# Patient Record
Sex: Female | Born: 1960
Health system: Southern US, Community
[De-identification: ages and names within clinical notes are randomized; demographics above are authoritative.]

## PROBLEM LIST (undated history)

## (undated) DIAGNOSIS — J40 Bronchitis, not specified as acute or chronic: Secondary | ICD-10-CM

## (undated) DIAGNOSIS — C801 Malignant (primary) neoplasm, unspecified: Secondary | ICD-10-CM

## (undated) DIAGNOSIS — D259 Leiomyoma of uterus, unspecified: Secondary | ICD-10-CM

## (undated) DIAGNOSIS — R0600 Dyspnea, unspecified: Secondary | ICD-10-CM

## (undated) DIAGNOSIS — Z853 Personal history of malignant neoplasm of breast: Secondary | ICD-10-CM

## (undated) DIAGNOSIS — J302 Other seasonal allergic rhinitis: Secondary | ICD-10-CM

## (undated) DIAGNOSIS — Z531 Procedure and treatment not carried out because of patient's decision for reasons of belief and group pressure: Secondary | ICD-10-CM

## (undated) DIAGNOSIS — A048 Other specified bacterial intestinal infections: Secondary | ICD-10-CM

## (undated) DIAGNOSIS — F419 Anxiety disorder, unspecified: Secondary | ICD-10-CM

## (undated) DIAGNOSIS — Z170 Estrogen receptor positive status [ER+]: Secondary | ICD-10-CM

## (undated) DIAGNOSIS — M1990 Unspecified osteoarthritis, unspecified site: Secondary | ICD-10-CM

## (undated) DIAGNOSIS — E785 Hyperlipidemia, unspecified: Secondary | ICD-10-CM

## (undated) DIAGNOSIS — R102 Pelvic and perineal pain: Secondary | ICD-10-CM

## (undated) DIAGNOSIS — IMO0001 Reserved for inherently not codable concepts without codable children: Secondary | ICD-10-CM

## (undated) DIAGNOSIS — N882 Stricture and stenosis of cervix uteri: Secondary | ICD-10-CM

## (undated) DIAGNOSIS — K389 Disease of appendix, unspecified: Secondary | ICD-10-CM

## (undated) DIAGNOSIS — Z973 Presence of spectacles and contact lenses: Secondary | ICD-10-CM

## (undated) DIAGNOSIS — I10 Essential (primary) hypertension: Secondary | ICD-10-CM

## (undated) DIAGNOSIS — K219 Gastro-esophageal reflux disease without esophagitis: Secondary | ICD-10-CM

## (undated) DIAGNOSIS — C50812 Malignant neoplasm of overlapping sites of left female breast: Secondary | ICD-10-CM

## (undated) DIAGNOSIS — R0609 Other forms of dyspnea: Secondary | ICD-10-CM

## (undated) DIAGNOSIS — G4733 Obstructive sleep apnea (adult) (pediatric): Secondary | ICD-10-CM

## (undated) DIAGNOSIS — R7611 Nonspecific reaction to tuberculin skin test without active tuberculosis: Secondary | ICD-10-CM

## (undated) DIAGNOSIS — Z87410 Personal history of cervical dysplasia: Secondary | ICD-10-CM

## (undated) DIAGNOSIS — R51 Headache: Secondary | ICD-10-CM

## (undated) HISTORY — PX: MASTECTOMY: SHX3

## (undated) HISTORY — DX: Leiomyoma of uterus, unspecified: D25.9

## (undated) HISTORY — DX: Hyperlipidemia, unspecified: E78.5

## (undated) HISTORY — DX: Malignant neoplasm of overlapping sites of left female breast: C50.812

## (undated) HISTORY — PX: CERVICAL CONIZATION W/BX: SHX1330

## (undated) HISTORY — DX: Obstructive sleep apnea (adult) (pediatric): G47.33

## (undated) HISTORY — DX: Essential (primary) hypertension: I10

## (undated) HISTORY — DX: Estrogen receptor positive status (ER+): Z17.0

## (undated) HISTORY — PX: APPENDECTOMY: SHX54

---
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
## 2009-03-26 HISTORY — PX: COLONOSCOPY: SHX174

## 2009-09-13 ENCOUNTER — Encounter: Payer: Self-pay | Admitting: Cardiology

## 2010-10-07 ENCOUNTER — Emergency Department (HOSPITAL_COMMUNITY)
Admission: EM | Admit: 2010-10-07 | Discharge: 2010-10-07 | Disposition: A | Discharge status: Home / Self Care | Payer: Medicaid - Out of State | Attending: Emergency Medicine | Admitting: Emergency Medicine

## 2010-10-07 DIAGNOSIS — M79609 Pain in unspecified limb: Secondary | ICD-10-CM | POA: Insufficient documentation

## 2010-10-07 DIAGNOSIS — T148 Other injury of unspecified body region: Secondary | ICD-10-CM | POA: Insufficient documentation

## 2010-10-07 DIAGNOSIS — M549 Dorsalgia, unspecified: Secondary | ICD-10-CM | POA: Insufficient documentation

## 2010-10-07 DIAGNOSIS — R609 Edema, unspecified: Secondary | ICD-10-CM | POA: Insufficient documentation

## 2010-10-07 DIAGNOSIS — L298 Other pruritus: Secondary | ICD-10-CM | POA: Insufficient documentation

## 2010-10-07 DIAGNOSIS — W57XXXA Bitten or stung by nonvenomous insect and other nonvenomous arthropods, initial encounter: Secondary | ICD-10-CM | POA: Insufficient documentation

## 2010-10-07 DIAGNOSIS — L2989 Other pruritus: Secondary | ICD-10-CM | POA: Insufficient documentation

## 2011-01-04 ENCOUNTER — Emergency Department (HOSPITAL_COMMUNITY): Payer: Medicare Other

## 2011-01-04 ENCOUNTER — Emergency Department (HOSPITAL_COMMUNITY)
Admission: EM | Admit: 2011-01-04 | Discharge: 2011-01-04 | Disposition: A | Discharge status: Home / Self Care | Payer: Medicare Other | Attending: Emergency Medicine | Admitting: Emergency Medicine

## 2011-01-04 DIAGNOSIS — M545 Low back pain, unspecified: Secondary | ICD-10-CM | POA: Insufficient documentation

## 2011-01-04 DIAGNOSIS — Z9010 Acquired absence of unspecified breast and nipple: Secondary | ICD-10-CM | POA: Insufficient documentation

## 2011-01-04 DIAGNOSIS — M546 Pain in thoracic spine: Secondary | ICD-10-CM | POA: Insufficient documentation

## 2011-01-04 DIAGNOSIS — Z79899 Other long term (current) drug therapy: Secondary | ICD-10-CM | POA: Insufficient documentation

## 2011-01-04 DIAGNOSIS — Z853 Personal history of malignant neoplasm of breast: Secondary | ICD-10-CM | POA: Insufficient documentation

## 2011-01-04 DIAGNOSIS — R079 Chest pain, unspecified: Secondary | ICD-10-CM | POA: Insufficient documentation

## 2011-01-04 DIAGNOSIS — M79609 Pain in unspecified limb: Secondary | ICD-10-CM | POA: Insufficient documentation

## 2011-01-04 LAB — URINALYSIS, ROUTINE W REFLEX MICROSCOPIC
Bilirubin Urine: NEGATIVE
Glucose, UA: NEGATIVE mg/dL
Ketones, ur: NEGATIVE mg/dL
Nitrite: NEGATIVE
Protein, ur: NEGATIVE mg/dL
pH: 6.5 (ref 5.0–8.0)

## 2011-01-04 LAB — POCT PREGNANCY, URINE: Preg Test, Ur: NEGATIVE

## 2011-01-11 ENCOUNTER — Emergency Department (HOSPITAL_COMMUNITY)
Admission: EM | Admit: 2011-01-11 | Discharge: 2011-01-12 | Disposition: A | Discharge status: Home / Self Care | Payer: Medicare Other | Attending: Emergency Medicine | Admitting: Emergency Medicine

## 2011-01-11 DIAGNOSIS — G4730 Sleep apnea, unspecified: Secondary | ICD-10-CM | POA: Insufficient documentation

## 2011-01-11 DIAGNOSIS — R0602 Shortness of breath: Secondary | ICD-10-CM | POA: Insufficient documentation

## 2011-01-11 DIAGNOSIS — J3489 Other specified disorders of nose and nasal sinuses: Secondary | ICD-10-CM | POA: Insufficient documentation

## 2011-01-11 DIAGNOSIS — M549 Dorsalgia, unspecified: Secondary | ICD-10-CM | POA: Insufficient documentation

## 2011-01-11 DIAGNOSIS — J45909 Unspecified asthma, uncomplicated: Secondary | ICD-10-CM | POA: Insufficient documentation

## 2011-01-11 DIAGNOSIS — R059 Cough, unspecified: Secondary | ICD-10-CM | POA: Insufficient documentation

## 2011-01-11 DIAGNOSIS — R05 Cough: Secondary | ICD-10-CM | POA: Insufficient documentation

## 2011-01-11 DIAGNOSIS — I10 Essential (primary) hypertension: Secondary | ICD-10-CM | POA: Insufficient documentation

## 2011-01-12 LAB — POCT I-STAT, CHEM 8
BUN: 16 mg/dL (ref 6–23)
Calcium, Ion: 1.15 mmol/L (ref 1.12–1.32)
Chloride: 106 mEq/L (ref 96–112)
Creatinine, Ser: 0.9 mg/dL (ref 0.50–1.10)
Glucose, Bld: 117 mg/dL — ABNORMAL HIGH (ref 70–99)
HCT: 45 % (ref 36.0–46.0)
Hemoglobin: 15.3 g/dL — ABNORMAL HIGH (ref 12.0–15.0)
Potassium: 4 meq/L (ref 3.5–5.1)
Sodium: 142 meq/L (ref 135–145)
TCO2: 21 mmol/L (ref 0–100)

## 2011-01-12 LAB — CBC
MCH: 26 pg (ref 26.0–34.0)
MCHC: 32.9 g/dL (ref 30.0–36.0)
MCV: 79 fL (ref 78.0–100.0)
Platelets: 286 10*3/uL (ref 150–400)
RDW: 15 % (ref 11.5–15.5)

## 2011-01-12 LAB — DIFFERENTIAL
Eosinophils Absolute: 0.3 10*3/uL (ref 0.0–0.7)
Eosinophils Relative: 4 % (ref 0–5)
Lymphs Abs: 2.1 10*3/uL (ref 0.7–4.0)
Monocytes Relative: 7 % (ref 3–12)

## 2011-01-31 ENCOUNTER — Ambulatory Visit
Admission: RE | Admit: 2011-01-31 | Discharge: 2011-01-31 | Disposition: A | Discharge status: Home / Self Care | Payer: Medicare Other | Source: Ambulatory Visit | Attending: Internal Medicine | Admitting: Internal Medicine

## 2011-01-31 ENCOUNTER — Other Ambulatory Visit: Payer: Self-pay | Admitting: Internal Medicine

## 2011-01-31 DIAGNOSIS — R0789 Other chest pain: Secondary | ICD-10-CM

## 2011-01-31 DIAGNOSIS — R52 Pain, unspecified: Secondary | ICD-10-CM

## 2011-02-07 ENCOUNTER — Encounter: Payer: Self-pay | Admitting: Internal Medicine

## 2011-02-08 ENCOUNTER — Ambulatory Visit (INDEPENDENT_AMBULATORY_CARE_PROVIDER_SITE_OTHER): Payer: Medicare Other | Admitting: Internal Medicine

## 2011-02-08 ENCOUNTER — Encounter: Payer: Self-pay | Admitting: Internal Medicine

## 2011-02-08 DIAGNOSIS — J45909 Unspecified asthma, uncomplicated: Secondary | ICD-10-CM

## 2011-02-08 MED ORDER — ALBUTEROL SULFATE HFA 108 (90 BASE) MCG/ACT IN AERS: 2.0000 | INHALATION_SPRAY | Freq: Four times a day (QID) | RESPIRATORY_TRACT | Status: DC | PRN

## 2011-02-08 MED ORDER — FLUTICASONE PROPIONATE HFA 44 MCG/ACT IN AERO: 2.0000 | INHALATION_SPRAY | Freq: Two times a day (BID) | RESPIRATORY_TRACT | Status: DC

## 2011-02-08 NOTE — Progress Notes (Signed)
  Subjective:    Patient ID: Michaela Little, female    DOB: January 13, 1961, 50 y.o.   MRN: [id number]  HPI  55 yobf quit smoking 1990 with asthma dx  around age 5 worse symptoms since around 2004 when eval in  [facility] moved to [place]  10/2010 and referred to pulmonary clinic by Dr Glade Lloyd 02/08/2011   02/08/2011 1st pulmonary eval cc doe x 50 ft plus episodes of breathing attacks(Triggers are emotional, smells, uri's )  where can't breath at rest baseline qod albuterol much better when she takes her flovent, assoc with sensation on pnds/ no purulent sputum.  In general Sleeping ok without nocturnal  or early am exacerbation  of respiratory  c/o's or need for noct saba. Also denies any obvious fluctuation of symptoms with weather or environmental changes or other aggravating or alleviating factors except as outlined above     Review of Systems  Constitutional: Negative for fever, chills and unexpected weight change.  HENT: Positive for congestion, sneezing and postnasal drip. Negative for ear pain, nosebleeds, sore throat, rhinorrhea, trouble swallowing, dental problem, voice change and sinus pressure.   Eyes: Positive for visual disturbance.  Respiratory: Positive for cough and shortness of breath. Negative for choking.   Cardiovascular: Positive for chest pain and leg swelling.  Gastrointestinal: Positive for vomiting and abdominal pain. Negative for diarrhea.  Genitourinary: Negative for difficulty urinating.  Musculoskeletal: Positive for arthralgias.  Skin: Positive for rash.  Neurological: Negative for tremors, syncope and headaches.  Hematological: Does not bruise/bleed easily.       Objective:   Physical Exam Obese bf nad Wt 285 02/08/2011  HEENT mild turbinate edema.  Oropharynx no thrush or excess pnd or cobblestoning.  No JVD or cervical adenopathy. Mild accessory muscle hypertrophy. Trachea midline, nl thryroid. Chest was hyperinflated by percussion with diminished breath sounds  and moderate increased exp time without wheeze. Hoover sign positive at mid inspiration. Regular rate and rhythm without murmur gallop or rub or increase P2 or edema.  Abd: no hsm, nl excursion. Ext warm without cyanosis or clubbing.      cxr 01/31/11 No acute abnormality.  Assessment & Plan:

## 2011-02-08 NOTE — Patient Instructions (Signed)
Work on inhaler technique:  relax and gently blow all the way out then take a nice smooth deep breath back in, triggering the inhaler at same time you start breathing in.  Hold for up to 5 seconds if you can.  Rinse and gargle with water when done   If your mouth or throat starts to bother you,   I suggest you time the inhaler to your dental care and after using the inhaler(s) brush teeth and tongue with a baking soda containing toothpaste and when you rinse this out, gargle with it first to see if this helps your mouth and throat.     If your breathing worsens or you need to use your rescue inhaler (ventolin) more than twice weekly or wake up more than twice a month with any respiratory symptoms or require more than two rescue inhalers per year, we need to see you right away because this means we're not controlling the underlying problem (inflammation) adequately.  Rescue inhalers do not control inflammation and overuse can lead to unnecessary and costly consequences.  They can make you feel better temporarily but eventually they will quit working effectively much as sleep aids lead to more insomnia if used regularly.   Please schedule a follow up visit in 3 months but call sooner if needed (Tammy NP also available for any flare)

## 2011-02-08 NOTE — Assessment & Plan Note (Addendum)
DDX of  difficult airways managment all start with A and  include Adherence, Ace Inhibitors, Acid Reflux, Active Sinus Disease, Alpha 1 Antitripsin deficiency, Anxiety masquerading as Airways dz,  ABPA,  allergy(esp in young), Aspiration (esp in elderly), Adverse effects of DPI,  Active smokers, plus two Bs  = Bronchiectasis and Beta blocker use..and one C= CHF  In this case Adherence is the biggest issue and starts with  inability to use HFA effectively and also  understand that SABA treats the symptoms but doesn't get to the underlying problem (inflammation).  I used  the analogy of putting steroid cream on a rash to help explain the meaning of topical therapy and the need to get the drug to the target tissue.  The proper method of use, as well as anticipated side effects, of this metered-dose inhaler are discussed and demonstrated to the patient. Improved to 75% p repeated coaching  ? Allergies > continue singulair    Each maintenance medication was reviewed in detail including most importantly the difference between maintenance and as needed and under what circumstances the prns are to be used.  Please see instructions for details which were reviewed in writing and the patient given a copy.

## 2011-02-19 ENCOUNTER — Ambulatory Visit (HOSPITAL_BASED_OUTPATIENT_CLINIC_OR_DEPARTMENT_OTHER): Payer: Medicare Other | Admitting: Oncology

## 2011-02-19 ENCOUNTER — Emergency Department (HOSPITAL_COMMUNITY)
Admission: EM | Admit: 2011-02-19 | Discharge: 2011-02-19 | Disposition: A | Discharge status: Home / Self Care | Payer: Medicare Other

## 2011-02-19 ENCOUNTER — Ambulatory Visit: Payer: Medicare Other | Admitting: Genetic Counselor

## 2011-02-19 ENCOUNTER — Encounter: Payer: Self-pay | Admitting: *Deleted

## 2011-02-19 ENCOUNTER — Encounter (HOSPITAL_BASED_OUTPATIENT_CLINIC_OR_DEPARTMENT_OTHER): Payer: Medicare Other | Admitting: Genetic Counselor

## 2011-02-19 VITALS — BP 155/92 | HR 78 | Temp 98.1°F | Ht 68.0 in | Wt 281.7 lb

## 2011-02-19 DIAGNOSIS — IMO0002 Reserved for concepts with insufficient information to code with codable children: Secondary | ICD-10-CM

## 2011-02-19 DIAGNOSIS — I890 Lymphedema, not elsewhere classified: Secondary | ICD-10-CM

## 2011-02-19 DIAGNOSIS — Z87410 Personal history of cervical dysplasia: Secondary | ICD-10-CM

## 2011-02-19 DIAGNOSIS — C50919 Malignant neoplasm of unspecified site of unspecified female breast: Secondary | ICD-10-CM

## 2011-02-19 LAB — CBC WITH DIFFERENTIAL/PLATELET
Basophils Absolute: 0 10*3/uL (ref 0.0–0.1)
EOS%: 3.5 % (ref 0.0–7.0)
MCH: 26.6 pg (ref 25.1–34.0)
MCV: 81.3 fL (ref 79.5–101.0)
MONO%: 5.6 % (ref 0.0–14.0)
RBC: 5.27 10*6/uL (ref 3.70–5.45)
RDW: 15.7 % — ABNORMAL HIGH (ref 11.2–14.5)

## 2011-02-19 LAB — COMPREHENSIVE METABOLIC PANEL
AST: 17 U/L (ref 0–37)
Albumin: 3.7 g/dL (ref 3.5–5.2)
Alkaline Phosphatase: 122 U/L — ABNORMAL HIGH (ref 39–117)
BUN: 9 mg/dL (ref 6–23)
Potassium: 3.9 mEq/L (ref 3.5–5.3)
Sodium: 137 mEq/L (ref 135–145)

## 2011-02-19 NOTE — Progress Notes (Signed)
Michaela Little    HPI:  Michaela Little is a 50 year old [place] a woman recently moved to [place] and establishing herself today in my practice, with a history of breast cancer.  The patient had left breast and left axillary lymph node biopsy performed March of 2010, both positive (I do not have the pathology report at this point). She was treated neo-adjuvantly at [facility] with (a) paclitaxel weekly x12 and (b) doxorubicin/cyclophosphamide in dose dense fashion x4, both given with tifiparnib. She then proceeded to left modified radical mastectomy 12/23/2008, the final pathology showing a 4 cm residual invasive lobular carcinoma, involving 5/22 lymph nodes sampled. The tumor was estrogen and progesterone receptor positive, HER-2 negative. Postoperatively she received 50 gray of radiation completed January of 2011, including the left supraclavicular lymph node basin. She was then started on tamoxifen, but developed some uterine lining thickening and was switched to anastrozole she thinks about 9 months ago. She has not taken this medication for the last 2 months due to her move here. Otherwise she tells me she has been tolerating it well.  Past Medical History  Diagnosis Date  . Breast cancer   . Uterine fibroid   . Hyperlipidemia   . Hypertension   . Asthma   . 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  Past Surgical History  Procedure Date  . Eye surgery 1961    right  . Mastectomy 2010    left    Family History  Problem Relation Age of Onset  . Breast cancer Mother   . Mental illness Brother   . Heart disease Brother   . Cerebral palsy Daughter   . Colon cancer Mother   . Emphysema Brother     never smoker   the patient knows little about her father. Her mother died  at the age of 88. She was diagnosed with colon cancer in her mid 64s. She had some breast surgery but the patient is not sure of what the cause was. The patient had no sisters she has 3 brothers no history of cancer there.  Gynecologic history: She is GX P5. At first pregnancy to term age 43. Menarche age 31. Last menstrual period about 2 years ago, when she had her chemotherapy  Social History: She is disabled secondary to arthritis, she tells me. She moved from [place] to [place] for "peace of mind". Of her 5 children only one daughter a listing Michaela Little, [place]. The patient lives alone. She mostly reads and does computer games and her phone during the day. She tells me she is starting at a stretching program and would eventually like to start an exercise program. She has 20 grandchildren. She does not have a healthcare power of attorney.    Health maintenance:   reports that she quit smoking about 22 years ago. Her smoking use included Cigarettes. She has a 3 pack-year smoking history. She has never used smokeless tobacco. She reports that she does not drink alcohol or use illicit drugs.   Cholesterol   Bone density   Colonoscopy 2010  (PAP)   Allergies:  Allergies  Allergen Reactions  . Tums Hives        ROS she complains of pain in her knees and hands:she says her knees and hands cramp in her she tries to  do anything with them.  She has some pain in her right chest area when she takes a deep breath. There has been no cough phlegm production or hemoptysis. She doesn't report shortness of breath. There have been no unusual headaches, no new visual changes, and no change in bowel or bladder habits. The detailed review of systems was otherwise significant only for moderate fatigue.  Physical Exam:  Blood pressure 155/92, pulse 78, temperature 98.1 F (36.7 C), temperature source Oral, height 5\' 8"  (1.727 m), weight 281 lb 11.2 oz (127.778 kg).  Sclerae unicteric; pupils  equal and reactive, with full extraocular movements Oropharynx clear No peripheral adenopathy Lungs no rales or rhonchi Heart regular rate and rhythm Abd benign MSK no focal spinal tenderness, there is moderate left upper extremity edema which appears chronic Neuro: nonfocal Breasts: Status post left modified radical mastectomy, with no evidence of local recurrence. She has an implant in place on the left. Right breast was unremarkable.  LABS   No results found for this or any previous visit (from the past 48 hour(s)).     FILMS: Chest X-ray Jan 31, 2011 was unremarkable. Rib films same day also negative. LSSpine Jan 04, 2011 showed mild DDD.     Assessment:  50 year old [place] woman recently moved here from [place], with left-sided breast cancer diagnosed March of 2010 and treated neo-adjuvantly as detailed above, followed by definitive left modified radical mastectomy September of 2010 for a T2 N2 or stage III invasive lobular breast cancer, estrogen and progesterone receptor positive, HER-2 negative, with post mastectomy radiation completed January of 2011, then briefly on tamoxifen, discontinued it because of the uterine lining concerns, on anastrozole since May of this year with good tolerance.  Other problems include left upper extremity lymphedema, borderline concern regarding genetic family history, history of cervical dysplasia, and history of dense breasts.    Plan: I am referring her to our lymphedema clinic and I also wrote her a prescription for a left upper extremity compression sleeve and glove today. I rewrote her a prescription for anastrozole and she understands she will continue on this medication an additional 4-1/2 years. We will do a bone density sometime within the next year or so.  I am referring her to gynecology for followup of her cervical dysplasia. I am also referring her to our genetics counselor for further evaluation. She will return to see [place] in March  of 2013 and at that point we will set her up for repeat mammography on the right at [facility] August of 2013, and we will also obtain a BSGI at the same time.  The patient knows to call us for any other issues related to her breast cancer that we may be of help with. Lab work from today is pending.  MAGRINAT,GUSTAV C 02/19/2011, 11:43 AM

## 2011-02-19 NOTE — Progress Notes (Signed)
CSW referred for transportation, financial needs, and other psychosocial needs.  CSW met with pt after her appointment to assess for needs and concerns.  Pt recently moved to [place] from [place] and had questions regarding what resources were available.  Pt stated she had Medicaid in [place], and has already started the process to tranfers/apply in [place].  CSW discussed ACS and road to recovery for transportation to appointments, and pt gave CSW permission to make a referral to ACS.  Pt stated she was planning to meet with the Financial counselor to discuss financial assistance.  CSW encouraged pt to complete all applications.  Pt also expressed interest in emotional support resources at [facility].  CSW informed pt of the many resources avaliable at [facility] and encouraged her to contact CSW with any needs.  Pt stated she planned to contact CSW to set up an appointment.  CSW will follow up with pt regarding transportation, community resources, and emotional support.  Tamala Julian, LCSW

## 2011-02-19 NOTE — Progress Notes (Signed)
Patient seen for genetic counseling 02/19/2011. Blood sent to [facility] for BRCA1/2. TAT 2 weeks.

## 2011-02-20 ENCOUNTER — Telehealth: Payer: Self-pay | Admitting: Oncology

## 2011-02-20 NOTE — Telephone Encounter (Signed)
Gv pt appt for [id number].  scheduled pt for appt with obgyn Dr. Jean Rosenthal @ [phone] on 02/28/2011 @ 1:30pm.  gv paperwork to Val to send over to the [facility] clinic for appt.

## 2011-02-20 NOTE — Telephone Encounter (Signed)
called medical recs and asked that they fax over notes to Dr. Jean Rosenthal for appt

## 2011-02-21 ENCOUNTER — Encounter (HOSPITAL_COMMUNITY): Payer: Self-pay | Admitting: Emergency Medicine

## 2011-02-21 ENCOUNTER — Emergency Department (HOSPITAL_COMMUNITY): Payer: Medicare Other

## 2011-02-21 ENCOUNTER — Emergency Department (HOSPITAL_COMMUNITY)
Admission: EM | Admit: 2011-02-21 | Discharge: 2011-02-21 | Disposition: A | Discharge status: Home / Self Care | Payer: Medicare Other | Attending: Emergency Medicine | Admitting: Emergency Medicine

## 2011-02-21 DIAGNOSIS — R1011 Right upper quadrant pain: Secondary | ICD-10-CM | POA: Insufficient documentation

## 2011-02-21 DIAGNOSIS — Z853 Personal history of malignant neoplasm of breast: Secondary | ICD-10-CM | POA: Insufficient documentation

## 2011-02-21 DIAGNOSIS — R1110 Vomiting, unspecified: Secondary | ICD-10-CM | POA: Insufficient documentation

## 2011-02-21 HISTORY — DX: Unspecified osteoarthritis, unspecified site: M19.90

## 2011-02-21 LAB — COMPREHENSIVE METABOLIC PANEL
AST: 19 U/L (ref 0–37)
Albumin: 3.6 g/dL (ref 3.5–5.2)
BUN: 9 mg/dL (ref 6–23)
CO2: 28 mEq/L (ref 19–32)
Calcium: 9.2 mg/dL (ref 8.4–10.5)
Chloride: 102 mEq/L (ref 96–112)
Creatinine, Ser: 0.75 mg/dL (ref 0.50–1.10)
GFR calc non Af Amer: >90 mL/min (ref >90)
Total Bilirubin: 0.8 mg/dL (ref 0.3–1.2)

## 2011-02-21 LAB — URINALYSIS, ROUTINE W REFLEX MICROSCOPIC
Glucose, UA: NEGATIVE mg/dL
Ketones, ur: NEGATIVE mg/dL
Leukocytes, UA: NEGATIVE
Nitrite: NEGATIVE
Protein, ur: NEGATIVE mg/dL

## 2011-02-21 LAB — DIFFERENTIAL
Basophils Absolute: 0 10*3/uL (ref 0.0–0.1)
Basophils Relative: 1 % (ref 0–1)
Eosinophils Relative: 4 % (ref 0–5)
Monocytes Absolute: 0.5 10*3/uL (ref 0.1–1.0)
Monocytes Relative: 9 % (ref 3–12)
Neutro Abs: 3.2 10*3/uL (ref 1.7–7.7)

## 2011-02-21 LAB — CBC
HCT: 40.5 % (ref 36.0–46.0)
Hemoglobin: 13.2 g/dL (ref 12.0–15.0)
MCHC: 32.6 g/dL (ref 30.0–36.0)
MCV: 80.4 fL (ref 78.0–100.0)
RDW: 14.9 % (ref 11.5–15.5)

## 2011-02-21 LAB — LIPASE, BLOOD: Lipase: 19 U/L (ref 11–59)

## 2011-02-21 MED ORDER — ONDANSETRON HCL 4 MG/2ML IJ SOLN
4.0000 mg | Freq: Once | INTRAMUSCULAR | Status: AC
  Administered 2011-02-21: 4 mg via INTRAVENOUS
  Filled 2011-02-21: qty 2

## 2011-02-21 MED ORDER — ONDANSETRON HCL 4 MG PO TABS: 4.0000 mg | ORAL_TABLET | Freq: Four times a day (QID) | ORAL | Status: AC

## 2011-02-21 MED ORDER — HYDROCODONE-ACETAMINOPHEN 5-325 MG PO TABS: ORAL_TABLET | ORAL | Status: DC

## 2011-02-21 MED ORDER — MORPHINE SULFATE 2 MG/ML IJ SOLN
INTRAMUSCULAR | Status: AC
  Administered 2011-02-21: 4 mg via INTRAVENOUS
  Filled 2011-02-21: qty 2

## 2011-02-21 MED ORDER — SODIUM CHLORIDE 0.9 % IV BOLUS (SEPSIS)
1000.0000 mL | Freq: Once | INTRAVENOUS | Status: AC
  Administered 2011-02-21: 1000 mL via INTRAVENOUS

## 2011-02-21 MED ORDER — MORPHINE SULFATE 4 MG/ML IJ SOLN: 4.0000 mg | Freq: Once | INTRAMUSCULAR | Status: DC

## 2011-02-21 NOTE — ED Notes (Signed)
Patient stable upon discharge. Discharged to bus with bus pass

## 2011-02-21 NOTE — ED Provider Notes (Addendum)
Review of Systems  Constitutional: Negative.  Negative for fever.  Respiratory: Negative.   Cardiovascular: Negative.   Gastrointestinal: Positive for vomiting and abdominal pain. Negative for diarrhea, blood in stool and melena.  Genitourinary: Negative.   Musculoskeletal: Negative.   Neurological: Negative.    HPI: The patient presents via Ambulance for evaluation of abdominal pain she has had for the past 3 days. She has had associated vomiting. No bloody emesis or melena. No sick contacts. She has not had previous similar symptoms. No fever.    ED Triage Vitals  Enc Vitals Group     BP 02/21/11 1631 137/83 mmHg     Pulse Rate 02/21/11 1631 77      Resp 02/21/11 2214 20      Temp 02/21/11 1631 98.5 F (36.9 C)     Temp src 02/21/11 1631 Oral     SpO2 02/21/11 1631 99 %     Weight --      Height --      Head Cir --      Peak Flow --      Pain Score 02/21/11 1631 Eight     Pain Loc --      Pain Edu? --      Excl. in GC? --     Physical Exam  Constitutional: She is well-developed, well-nourished, and in no distress. No distress.  Eyes: Conjunctivae are normal.  Neck: Normal range of motion.  Cardiovascular: Normal rate.   No murmur heard. Pulmonary/Chest: Effort normal and breath sounds normal.  Abdominal: Soft.       Tender most in RUQ but diffusely tender to a lesser degree. BS + x 4. No mass. No distention.  Skin: Skin is warm and dry.          She continues to have right sided pain in upper abdomen. Reports this pain x months as constant. Not affected by eating, is affected by moving. No SOB, cough issues. CT abd/pel negative with exception of uterine fibroid. Discussed findings with the patient. She will follow up with Dr. Tamela Oddi as scheduled for lower abdominal pain and with Dr. Sander Radon for continuous right sided pain. Will give Norco for pain and Zofran for nausea.  Rodena Medin, PA 02/21/11 2157  Rodena Medin, PA-C 05/03/11 [phone]

## 2011-02-21 NOTE — ED Notes (Signed)
[id number]<AV> Expected date:02/21/11<BR> Expected time: 3:57 PM<BR> Means of arrival:Ambulance<BR> Comments:<BR> EMS 41 GC - abd pain

## 2011-02-21 NOTE — ED Notes (Signed)
MD at bedside. 

## 2011-02-21 NOTE — ED Notes (Signed)
Per ems.  Pt has 3 day hx of abd pain and vomiting.  Pain worst in RUQ, but has shooting pain in LLQ.  No fever, or blood in stool or emesis.

## 2011-02-22 LAB — GC/CHLAMYDIA PROBE AMP, GENITAL: GC Probe Amp, Genital: NEGATIVE

## 2011-02-22 NOTE — ED Provider Notes (Signed)
Medical screening examination/treatment/procedure(s) were performed by non-physician practitioner and as supervising physician I was immediately available for consultation/collaboration.  Rigo Letts, MD 02/22/11 0250 

## 2011-02-27 ENCOUNTER — Encounter: Payer: Self-pay | Admitting: Pulmonary Disease

## 2011-02-27 ENCOUNTER — Ambulatory Visit (INDEPENDENT_AMBULATORY_CARE_PROVIDER_SITE_OTHER): Payer: Medicare Other | Admitting: Pulmonary Disease

## 2011-02-27 VITALS — BP 120/72 | HR 77 | Temp 98.0°F | Ht 67.0 in | Wt 285.4 lb

## 2011-02-27 DIAGNOSIS — G4733 Obstructive sleep apnea (adult) (pediatric): Secondary | ICD-10-CM

## 2011-02-27 NOTE — Progress Notes (Deleted)
  Subjective:    Patient ID: Michaela Little, female    DOB: April 30, 1960, 50 y.o.   MRN: [id number]  HPI    Review of Systems  Constitutional: Positive for unexpected weight change.  HENT: Positive for congestion.   Respiratory: Positive for cough and shortness of breath.   Cardiovascular: Positive for chest pain and palpitations.  Gastrointestinal: Positive for abdominal pain.  Musculoskeletal: Positive for joint swelling and arthralgias.       Objective:   Physical Exam        Assessment & Plan:

## 2011-02-27 NOTE — Assessment & Plan Note (Signed)
The patient has been diagnosed with moderate sleep apnea this summer, but has not started on CPAP therapy.  She will need to be referred to a new DME, and will also need to check her machine settings and mask fit.  I had a long conversation with her about sleep apnea, including its impact on her quality of life and cardiovascular health.  Will start her out at a moderate pressure level, and ultimately we'll need to optimize pressure for her.  I have also encouraged her to work aggressively on weight loss.

## 2011-02-27 NOTE — Progress Notes (Signed)
  Subjective:    Patient ID: Michaela Little, female    DOB: Feb 05, 1961, 50 y.o.   MRN: [id number]  HPI The patient is a 50 year-old female who I have been asked to see for management of obstructive sleep apnea.  She was diagnosed with moderate sleep apnea in July of 2012, with an AHI of 28 events per hour.  She was started on CPAP, however has never worn the machine since she was moving to [place].  The patient has been noted to have loud snoring, and is rarely rested in the mornings upon arising.  She has significant daytime sleepiness and fatigue with periods of inactivity.  Her weight has increased 70 pounds over the last 2 years.  She has brought her CPAP machine with her today, but has no idea what it is set on.  Sleep Questionnaire: What time do you typically go to bed?( Between what hours) 11 pm How long does it take you to fall asleep? depends sometime right away How many times during the night do you wake up? 5 What time do you get out of bed to start your day? 0800 Do you drive or operate heavy machinery in your occupation? No How much has your weight changed (up or down) over the past two years? (In pounds) 70 lb (31.752 kg) Have you ever had a sleep study before? Yes If yes, location of study? [place] If yes, date of study? 10/11/2010 Do you currently use CPAP? Yes If so, what pressure? not sure Do you wear oxygen at any time? No     Review of Systems  Constitutional: Positive for unexpected weight change. Negative for fever.  HENT: Positive for congestion. Negative for ear pain, nosebleeds, sore throat, rhinorrhea, sneezing, trouble swallowing, dental problem, postnasal drip and sinus pressure.   Eyes: Negative for redness and itching.  Respiratory: Positive for cough and shortness of breath. Negative for chest tightness and wheezing.   Cardiovascular: Positive for palpitations and leg swelling.  Gastrointestinal: Negative for nausea and vomiting.  Genitourinary: Negative for dysuria.    Musculoskeletal: Positive for joint swelling.  Skin: Negative for rash.  Neurological: Negative for headaches.  Hematological: Does not bruise/bleed easily.  Psychiatric/Behavioral: Negative for dysphoric mood. The patient is not nervous/anxious.        Objective:   Physical Exam Constitutional:  Obese female, no acute distress  HENT:  Nares patent without discharge  Oropharynx without exudate, palate and uvula are thickened and elongated.  Eyes:  Perrla, eomi, no scleral icterus  Neck:  No JVD, no TMG  Cardiovascular:  Normal rate, regular rhythm, no rubs or gallops.  No murmurs        Intact distal pulses  Pulmonary :  Normal breath sounds, no stridor or respiratory distress   No rales, rhonchi, or wheezing  Abdominal:  Soft, nondistended, bowel sounds present.  No tenderness noted.   Musculoskeletal:  mild lower extremity edema noted.  Lymph Nodes:  No cervical lymphadenopathy noted  Skin:  No cyanosis noted  Neurologic:  Appears sleepy, appropriate, moves all 4 extremities without obvious deficit.         Assessment & Plan:

## 2011-02-27 NOTE — Patient Instructions (Signed)
Will get your cpap machine setup, and you need to wear everynight Please call if having issues with tolerance Work on weight loss. followup with me in 5 weeks.

## 2011-02-28 ENCOUNTER — Telehealth: Payer: Self-pay | Admitting: Genetic Counselor

## 2011-03-04 ENCOUNTER — Emergency Department (HOSPITAL_COMMUNITY): Payer: Medicare Other

## 2011-03-04 ENCOUNTER — Encounter (HOSPITAL_COMMUNITY): Payer: Self-pay | Admitting: Emergency Medicine

## 2011-03-04 ENCOUNTER — Inpatient Hospital Stay (HOSPITAL_COMMUNITY)
Admission: EM | Admit: 2011-03-04 | Discharge: 2011-03-06 | DRG: 194 | Disposition: A | Discharge status: Home Health | Payer: Medicare Other | Attending: Internal Medicine | Admitting: Internal Medicine

## 2011-03-04 DIAGNOSIS — E785 Hyperlipidemia, unspecified: Secondary | ICD-10-CM | POA: Diagnosis present

## 2011-03-04 DIAGNOSIS — J09X2 Influenza due to identified novel influenza A virus with other respiratory manifestations: Principal | ICD-10-CM | POA: Diagnosis present

## 2011-03-04 DIAGNOSIS — I5189 Other ill-defined heart diseases: Secondary | ICD-10-CM | POA: Diagnosis present

## 2011-03-04 DIAGNOSIS — G4733 Obstructive sleep apnea (adult) (pediatric): Secondary | ICD-10-CM | POA: Diagnosis present

## 2011-03-04 DIAGNOSIS — R000 Tachycardia, unspecified: Secondary | ICD-10-CM

## 2011-03-04 DIAGNOSIS — B349 Viral infection, unspecified: Secondary | ICD-10-CM

## 2011-03-04 DIAGNOSIS — Z853 Personal history of malignant neoplasm of breast: Secondary | ICD-10-CM

## 2011-03-04 DIAGNOSIS — J45909 Unspecified asthma, uncomplicated: Secondary | ICD-10-CM | POA: Diagnosis present

## 2011-03-04 DIAGNOSIS — R509 Fever, unspecified: Secondary | ICD-10-CM

## 2011-03-04 DIAGNOSIS — Z6841 Body Mass Index (BMI) 40.0 and over, adult: Secondary | ICD-10-CM

## 2011-03-04 DIAGNOSIS — J111 Influenza due to unidentified influenza virus with other respiratory manifestations: Secondary | ICD-10-CM | POA: Diagnosis present

## 2011-03-04 DIAGNOSIS — I10 Essential (primary) hypertension: Secondary | ICD-10-CM

## 2011-03-04 DIAGNOSIS — E669 Obesity, unspecified: Secondary | ICD-10-CM

## 2011-03-04 LAB — CBC
HCT: 42.1 % (ref 36.0–46.0)
Hemoglobin: 14.1 g/dL (ref 12.0–15.0)
MCH: 26.5 pg (ref 26.0–34.0)
MCV: 79.1 fL (ref 78.0–100.0)
RBC: 5.32 MIL/uL — ABNORMAL HIGH (ref 3.87–5.11)
WBC: 7.5 10*3/uL (ref 4.0–10.5)

## 2011-03-04 LAB — DIFFERENTIAL
Eosinophils Absolute: 0.1 10*3/uL (ref 0.0–0.7)
Eosinophils Relative: 2 % (ref 0–5)
Lymphocytes Relative: 6 % — ABNORMAL LOW (ref 12–46)
Lymphs Abs: 0.5 10*3/uL — ABNORMAL LOW (ref 0.7–4.0)
Monocytes Absolute: 0.4 10*3/uL (ref 0.1–1.0)
Monocytes Relative: 5 % (ref 3–12)

## 2011-03-04 LAB — BASIC METABOLIC PANEL
BUN: 7 mg/dL (ref 6–23)
CO2: 26 mEq/L (ref 19–32)
Calcium: 9.6 mg/dL (ref 8.4–10.5)
Creatinine, Ser: 0.84 mg/dL (ref 0.50–1.10)
Glucose, Bld: 114 mg/dL — ABNORMAL HIGH (ref 70–99)
Sodium: 136 mEq/L (ref 135–145)

## 2011-03-04 MED ORDER — SODIUM CHLORIDE 0.9 % IV BOLUS (SEPSIS)
1000.0000 mL | Freq: Once | INTRAVENOUS | Status: AC
  Administered 2011-03-04: 1000 mL via INTRAVENOUS

## 2011-03-04 MED ORDER — FUROSEMIDE 10 MG/ML IJ SOLN
40.0000 mg | Freq: Once | INTRAMUSCULAR | Status: AC
  Administered 2011-03-05: 40 mg via INTRAVENOUS
  Filled 2011-03-04: qty 4

## 2011-03-04 MED ORDER — SODIUM CHLORIDE 0.9 % IJ SOLN
3.0000 mL | INTRAMUSCULAR | Status: AC
  Administered 2011-03-04: 3 mL via INTRAVENOUS

## 2011-03-04 MED ORDER — HYDROCOD POLST-CHLORPHEN POLST 10-8 MG/5ML PO LQCR
5.0000 mL | Freq: Once | ORAL | Status: AC
  Administered 2011-03-04: 5 mL via ORAL
  Filled 2011-03-04: qty 5

## 2011-03-04 MED ORDER — KETOROLAC TROMETHAMINE 30 MG/ML IJ SOLN
30.0000 mg | Freq: Once | INTRAMUSCULAR | Status: AC
  Administered 2011-03-04: 30 mg via INTRAVENOUS
  Filled 2011-03-04: qty 1

## 2011-03-04 MED ORDER — LABETALOL HCL 5 MG/ML IV SOLN
20.0000 mg | Freq: Once | INTRAVENOUS | Status: AC
  Administered 2011-03-04: 20 mg via INTRAVENOUS
  Filled 2011-03-04: qty 4

## 2011-03-04 MED ORDER — ACETAMINOPHEN 325 MG PO TABS
650.0000 mg | ORAL_TABLET | Freq: Four times a day (QID) | ORAL | Status: DC | PRN
  Administered 2011-03-04 – 2011-03-05 (×2): 650 mg via ORAL
  Filled 2011-03-04 (×2): qty 2

## 2011-03-04 MED ORDER — IBUPROFEN 800 MG PO TABS
800.0000 mg | ORAL_TABLET | Freq: Once | ORAL | Status: AC
  Administered 2011-03-04: 800 mg via ORAL
  Filled 2011-03-04: qty 1

## 2011-03-04 NOTE — ED Notes (Signed)
EAV:[id number]<JX> Expected date:03/04/11<BR> Expected time: 4:32 PM<BR> Means of arrival:Ambulance<BR> Comments:<BR> M41 - 50yoF Flu s/s

## 2011-03-04 NOTE — H&P (Signed)
PCP:   Oneal Grout, MD, MD   Chief Complaint:  Shortness of breath  HPI: This a 50 year old female with history of breast cancer, yesterday she was exposed to sick contacts in a closed vehicle. Last night she started developing a headache, myalgia, and a cough. Her headache was generalized and she had floaters. This morning her symptoms got worse. She developed a hacking cough, productive of whitish sputum. She developed nausea vomiting, with small string of blood. She developed a fever up to 102. She is short of breath and wheezing and very congested. She became weak, dizzy and lightheaded. She reports no chest pains, no diarrhea, no abdominal pains. Patient say she was quite short of breath. She came to the ER. History obtained from the patient. Patient has no previous cardiac history. She does have a history of breast cancer for which she underwent chemotherapy and radiation therapy.  Review of Systems: Positives bolded  The patient denies anorexia, fever, weight loss,, vision loss, decreased hearing, hoarseness, chest pain, syncope, dyspnea on exertion, peripheral edema, balance deficits, hemoptysis, abdominal pain, melena, hematochezia, severe indigestion/heartburn, hematuria, incontinence, genital sores, muscle weakness, suspicious skin lesions, transient blindness, difficulty walking, depression, unusual weight change, abnormal bleeding, enlarged lymph nodes, angioedema, and breast masses.  Past Medical History: Past Medical History  Diagnosis Date  . Breast cancer   . Uterine fibroid   . Hyperlipidemia   . Hypertension   . Asthma   . OSA (obstructive sleep apnea)   . Arthritis    Past Surgical History  Procedure Date  . Eye surgery 1961    right  . Mastectomy 2010    left    Medications: Prior to Admission medications   Medication Sig Start Date End Date Taking? Authorizing Provider  albuterol (VENTOLIN HFA) 108 (90 BASE) MCG/ACT inhaler Inhale 2 puffs into the lungs  every 6 (six) hours as needed for wheezing. 02/08/11 02/08/12 Yes Sandrea Hughs, MD  anastrozole (ARIMIDEX) 1 MG tablet Take 1 mg by mouth daily.    Yes Historical Provider, MD  cyclobenzaprine (FLEXERIL) 5 MG tablet Take 5 mg by mouth 3 (three) times daily as needed.    Yes Historical Provider, MD  diltiazem (CARDIZEM) 60 MG tablet Take 60 mg by mouth 2 (two) times daily.    Yes Historical Provider, MD  fluticasone (FLOVENT HFA) 44 MCG/ACT inhaler Inhale 2 puffs into the lungs 2 (two) times daily.   02/08/11 02/08/12 Yes Sandrea Hughs, MD  furosemide (LASIX) 40 MG tablet Take 40 mg by mouth daily as needed. For fluid retention   Yes Historical Provider, MD  HYDROcodone-acetaminophen (NORCO) 5-325 MG per tablet One to two tablets every 4-6 hours prn pain 02/21/11  Yes Rodena Medin, PA  ipratropium-albuterol (DUONEB) 0.5-2.5 (3) MG/3ML SOLN Take 3 mLs by nebulization every 4 (four) hours as needed.     Yes Historical Provider, MD  loratadine (CLARITIN) 10 MG tablet Take 10 mg by mouth daily.    Yes Historical Provider, MD  montelukast (SINGULAIR) 10 MG tablet Take 10 mg by mouth at bedtime.    Yes Historical Provider, MD  simvastatin (ZOCOR) 20 MG tablet Take 20 mg by mouth at bedtime.    Yes Historical Provider, MD  traMADol (ULTRAM) 50 MG tablet Take 50-100 mg by mouth every 6 (six) hours as needed. 1 to 2 every 8 hours as needed   Yes Historical Provider, MD  Vitamin D, Ergocalciferol, (DRISDOL) 50000 UNITS CAPS Take 50,000 Units by mouth every 7 (seven)  days. Patient states that she takes once a week for 6 weeks. Could not get filled because she had no money.   Yes Historical Provider, MD    Allergies:   Allergies  Allergen Reactions  . Tums Hives    Social History:  reports that she quit smoking about 22 years ago. Her smoking use included Cigarettes. She has a 3 pack-year smoking history. She has never used smokeless tobacco. She reports that she does not drink alcohol or use illicit  drugs.  Family History: Family History  Problem Relation Age of Onset  . Breast cancer Mother   . Mental illness Brother   . Heart disease Brother   . Cerebral palsy Daughter   . Colon cancer Mother   . Emphysema Brother     never smoker    Physical Exam: Filed Vitals:   03/04/11 1700 03/04/11 1906 03/04/11 1954  BP: 184/99  162/96  Pulse: 107  108  Temp: 102.9 F (39.4 C) 103 F (39.4 C) 101.3 F (38.5 C)  TempSrc: Oral Oral Oral  SpO2: 98%  97%    General:  Alert and oriented times three, well developed and nourished, no acute distress short of breath Eyes: PERRLA, pink conjunctiva, no scleral icterus ENT: Moist oral mucosa, neck supple, no thyromegaly Lungs: clear to ascultation, no wheeze, no crackles, no use of accessory muscles Cardiovascular: regular rate and rhythm, no regurgitation, no gallops, no murmurs. No carotid bruits, no JVD Abdomen: soft, positive BS, non-tender, non-distended, no organomegaly, not an acute abdomen GU: not examined Neuro: CN II - XII grossly intact, sensation intact Musculoskeletal: strength 5/5 all extremities, no clubbing, cyanosis or edema Skin: no rash, no subcutaneous crepitation, no decubitus Psych: appropriate patient   Labs on Admission:   Basename 03/04/11 1851  NA 136  K 3.9  CL 102  CO2 26  GLUCOSE 114*  BUN 7  CREATININE 0.84  CALCIUM 9.6  MG --  PHOS --   No results found for this basename: AST:2,ALT:2,ALKPHOS:2,BILITOT:2,PROT:2,ALBUMIN:2 in the last 72 hours No results found for this basename: LIPASE:2,AMYLASE:2 in the last 72 hours  Basename 03/04/11 1851  WBC 7.5  NEUTROABS 6.5  HGB 14.1  HCT 42.1  MCV 79.1  PLT 237   No results found for this basename: CKTOTAL:3,CKMB:3,CKMBINDEX:3,TROPONINI:3 in the last 72 hours No results found for this basename: TSH,T4TOTAL,FREET3,T3FREE,THYROIDAB in the last 72 hours No results found for this basename: VITAMINB12:2,FOLATE:2,FERRITIN:2,TIBC:2,IRON:2,RETICCTPCT:2  in the last 72 hours  Radiological Exams on Admission: Dg Chest Port 1 View  03/04/2011  *RADIOLOGY REPORT*  Clinical Data: Cough and fever  PORTABLE CHEST - 1 VIEW  Comparison: 01/31/2011  Findings: 1838 hours.  Lung volumes are low. The cardiopericardial silhouette is enlarged. There is pulmonary vascular congestion without overt pulmonary edema.  No dense focal airspace consolidation. Telemetry leads overlie the chest.  IMPRESSION: Low volume film with vascular congestion.  Original Report Authenticated By: ERIC A. MANSELL, M.D.   EKG; normal sinus rhythm  Assessment/Plan Present on Admission:  . clinical Influenza/viral illness Asthma Admit to telemetry Rapid flu ordered Blood cultures urine cultures and sputum cultures ordered  Tamiflu ordered, no antibiotics Tessalon Perles, oxygen and duo nebs Chest x-ray with vascular congestion Lasix ordered  Echo to evaluate EF Patient admitted to telemetry No complaints of chest pain. start aspirin, lipid panel Patient did have chemotherapy in the past History of breast cancer Completed radiation and chemotherapy Continue home medications Hypertension uncontrolled  Resume home medication and when necessary blood pressure  medication Hyperlipidemia Obesity Obstructive sleep apnea Stable Resume home meds    Full code  DVT prophylaxis Team 2/Dr. Darnelle Catalan   Time in 11:20 PM Time out over 5 in a.m.   Soni Kegel 03/04/2011, 11:49 PM

## 2011-03-04 NOTE — ED Notes (Signed)
Patient states that she is having FLu Like symptoms starting yesterday. EMS noted HTN and tachycardia. Productive cough

## 2011-03-04 NOTE — ED Provider Notes (Signed)
History     CSN: [id number] Arrival date & time: 03/04/2011  4:51 PM   First MD Initiated Contact with Patient 03/04/11 1906      Chief Complaint  Patient presents with  . Hypertension  . Tachycardia  . Fever  . Nausea    (Consider location/radiation/quality/duration/timing/severity/associated sxs/prior treatment) Patient is a 50 y.o. female presenting with hypertension and fever. The history is provided by the patient. The history is limited by the condition of the patient.  Hypertension Associated symptoms include shortness of breath. Pertinent negatives include no chest pain, no abdominal pain and no headaches.  Fever Primary symptoms of the febrile illness include fever, cough and shortness of breath. Primary symptoms do not include headaches, abdominal pain, nausea, vomiting, diarrhea, dysuria or rash.   the patient is a 50 year old, female, who presents to emergency department complaining of cough, fever, sore throat since yesterday.  She denies chest pain, nausea, vomiting, diarrhea, or urinary tract symptoms.  She denies smoking or prior history of coronary artery disease.  Past Medical History  Diagnosis Date  . Breast cancer   . Uterine fibroid   . Hyperlipidemia   . Hypertension   . Asthma   . OSA (obstructive sleep apnea)   . Arthritis     Past Surgical History  Procedure Date  . Eye surgery 1961    right  . Mastectomy 2010    left    Family History  Problem Relation Age of Onset  . Breast cancer Mother   . Mental illness Brother   . Heart disease Brother   . Cerebral palsy Daughter   . Colon cancer Mother   . Emphysema Brother     never smoker    History  Substance Use Topics  . Smoking status: Former Smoker -- 0.3 packs/day for 10 years    Types: Cigarettes    Quit date: 03/26/1988  . Smokeless tobacco: Never Used  . Alcohol Use: No    OB History    Grav Para Term Preterm Abortions TAB SAB Ect Mult Living                  Review of  Systems  Constitutional: Positive for fever and chills.  HENT: Positive for sore throat. Negative for congestion.   Eyes: Negative for redness.  Respiratory: Positive for cough and shortness of breath. Negative for chest tightness.   Cardiovascular: Negative for chest pain and leg swelling.  Gastrointestinal: Negative for nausea, vomiting, abdominal pain and diarrhea.  Genitourinary: Negative for dysuria and hematuria.  Skin: Negative for rash.  Neurological: Negative for headaches.  Hematological: Does not bruise/bleed easily.  Psychiatric/Behavioral: Negative for confusion.    Allergies  Tums  Home Medications   Current Outpatient Rx  Name Route Sig Dispense Refill  . ALBUTEROL SULFATE HFA 108 (90 BASE) MCG/ACT IN AERS Inhalation Inhale 2 puffs into the lungs every 6 (six) hours as needed for wheezing. 1 Inhaler 2  . ANASTROZOLE 1 MG PO TABS Oral Take 1 mg by mouth daily.     . CYCLOBENZAPRINE HCL 5 MG PO TABS Oral Take 5 mg by mouth 3 (three) times daily as needed.     Marland Kitchen DILTIAZEM HCL 60 MG PO TABS Oral Take 60 mg by mouth 2 (two) times daily.     Marland Kitchen FLUTICASONE PROPIONATE  HFA 44 MCG/ACT IN AERO Inhalation Inhale 2 puffs into the lungs 2 (two) times daily.      . FUROSEMIDE 40 MG PO TABS Oral  Take 40 mg by mouth daily as needed. For fluid retention    . HYDROCODONE-ACETAMINOPHEN 5-325 MG PO TABS  One to two tablets every 4-6 hours prn pain 12 tablet 0  . IPRATROPIUM-ALBUTEROL 0.5-2.5 (3) MG/3ML IN SOLN Nebulization Take 3 mLs by nebulization every 4 (four) hours as needed.      Marland Kitchen LORATADINE 10 MG PO TABS Oral Take 10 mg by mouth daily.     Marland Kitchen MONTELUKAST SODIUM 10 MG PO TABS Oral Take 10 mg by mouth at bedtime.     Marland Kitchen SIMVASTATIN 20 MG PO TABS Oral Take 20 mg by mouth at bedtime.     . TRAMADOL HCL 50 MG PO TABS Oral Take 50-100 mg by mouth every 6 (six) hours as needed. 1 to 2 every 8 hours as needed    . VITAMIN D (ERGOCALCIFEROL) 50000 UNITS PO CAPS Oral Take 50,000 Units by  mouth every 7 (seven) days. Patient states that she takes once a week for 6 weeks. Could not get filled because she had no money.      BP 184/99  Pulse 107  Temp(Src) 103 F (39.4 C) (Oral)  SpO2 98%  Physical Exam  Nursing note and vitals reviewed. Constitutional: She is oriented to person, place, and time.       Morbidly obese, anxious  HENT:  Head: Normocephalic and atraumatic.  Eyes: Pupils are equal, round, and reactive to light.  Neck: Normal range of motion.  Cardiovascular: Regular rhythm and normal heart sounds.   No murmur heard.      Tachycardic  Pulmonary/Chest: Effort normal and breath sounds normal. No respiratory distress. She has no wheezes. She has no rales.  Abdominal: Soft. She exhibits no distension and no mass. There is no tenderness. There is no rebound and no guarding.  Musculoskeletal: Normal range of motion. She exhibits no edema and no tenderness.  Neurological: She is alert and oriented to person, place, and time. No cranial nerve deficit.  Skin: Skin is warm. No rash noted. She is diaphoretic. No erythema.       Diaphoretic  Psychiatric: She has a normal mood and affect. Her behavior is normal.    ED Course  Procedures (including critical care time) 50 year old, female, with flulike symptoms since, yesterday.  We'll perform a chest x-ray, and laboratory testing, to evaluate for possible pneumonia.  We will treat her tachycardia and fever with fluids, antipyretics.  Panel.  I will give her labetalol for hypertension.  Labs Reviewed  CBC - Abnormal; Notable for the following:    RBC 5.32 (*)    All other components within normal limits  DIFFERENTIAL - Abnormal; Notable for the following:    Neutrophils Relative 88 (*)    Lymphocytes Relative 6 (*)    Lymphs Abs 0.5 (*)    All other components within normal limits  BASIC METABOLIC PANEL - Abnormal; Notable for the following:    Glucose, Bld 114 (*)    GFR calc non Af Amer 79 (*)    All other  components within normal limits   Dg Chest Port 1 View  03/04/2011  *RADIOLOGY REPORT*  Clinical Data: Cough and fever  PORTABLE CHEST - 1 VIEW  Comparison: 01/31/2011  Findings: 1838 hours.  Lung volumes are low. The cardiopericardial silhouette is enlarged. There is pulmonary vascular congestion without overt pulmonary edema.  No dense focal airspace consolidation. Telemetry leads overlie the chest.  IMPRESSION: Low volume film with vascular congestion.  Original Report Authenticated By:  ERIC A. MANSELL, M.D.     10:35 PM Still feels bad 11:04 PM I spoke with the triad hospitalist. She will come admit the pt.  MDM  Fever, tachycardia Possible influenza        Nicholes Stairs, MD 03/04/11 2304

## 2011-03-04 NOTE — ED Notes (Signed)
Patient comes in for increased WOB and SOB, associated with a cough. The patient reports that she has had a fever for just today. The patient has fever on arrival. Protocols started. Patient reports pain to her left chest with coughing. The patient is sleepy, but awakens easily and answers questions.

## 2011-03-05 ENCOUNTER — Encounter (HOSPITAL_COMMUNITY): Payer: Self-pay | Admitting: *Deleted

## 2011-03-05 LAB — CBC
HCT: 38.3 % (ref 36.0–46.0)
HCT: 39.4 % (ref 36.0–46.0)
MCH: 26.4 pg (ref 26.0–34.0)
MCHC: 32.5 g/dL (ref 30.0–36.0)
MCV: 80.1 fL (ref 78.0–100.0)
Platelets: 201 10*3/uL (ref 150–400)
RDW: 15 % (ref 11.5–15.5)
RDW: 15 % (ref 11.5–15.5)

## 2011-03-05 LAB — INFLUENZA PANEL BY PCR (TYPE A & B): Influenza A By PCR: POSITIVE — AB

## 2011-03-05 LAB — BASIC METABOLIC PANEL
BUN: 8 mg/dL (ref 6–23)
Chloride: 103 mEq/L (ref 96–112)
Creatinine, Ser: 0.93 mg/dL (ref 0.50–1.10)
GFR calc Af Amer: 80 mL/min — ABNORMAL LOW (ref >90)
GFR calc non Af Amer: 69 mL/min — ABNORMAL LOW (ref >90)

## 2011-03-05 LAB — LIPID PANEL
Cholesterol: 195 mg/dL (ref 0–200)
HDL: 48 mg/dL (ref >39)
Total CHOL/HDL Ratio: 4.1 RATIO
VLDL: 12 mg/dL (ref 0–40)

## 2011-03-05 LAB — EXPECTORATED SPUTUM ASSESSMENT W GRAM STAIN, RFLX TO RESP C

## 2011-03-05 LAB — CREATININE, SERUM: GFR calc Af Amer: 80 mL/min — ABNORMAL LOW (ref >90)

## 2011-03-05 LAB — URINALYSIS, ROUTINE W REFLEX MICROSCOPIC
Bilirubin Urine: NEGATIVE
Glucose, UA: NEGATIVE mg/dL
Hgb urine dipstick: NEGATIVE
Protein, ur: NEGATIVE mg/dL

## 2011-03-05 LAB — TROPONIN I
Troponin I: <0.3 ng/mL (ref <0.30)
Troponin I: <0.3 ng/mL (ref <0.30)

## 2011-03-05 MED ORDER — HYDROCODONE-ACETAMINOPHEN 5-325 MG PO TABS
1.0000 | ORAL_TABLET | ORAL | Status: DC | PRN
  Administered 2011-03-05 (×2): 1 via ORAL
  Filled 2011-03-05 (×2): qty 1

## 2011-03-05 MED ORDER — DILTIAZEM HCL 90 MG PO TABS
90.0000 mg | ORAL_TABLET | Freq: Two times a day (BID) | ORAL | Status: DC
  Administered 2011-03-05 – 2011-03-06 (×2): 90 mg via ORAL
  Filled 2011-03-05 (×3): qty 1

## 2011-03-05 MED ORDER — ACETAMINOPHEN 325 MG PO TABS: 650.0000 mg | ORAL_TABLET | Freq: Four times a day (QID) | ORAL | Status: DC | PRN

## 2011-03-05 MED ORDER — OSELTAMIVIR PHOSPHATE 75 MG PO CAPS
75.0000 mg | ORAL_CAPSULE | Freq: Two times a day (BID) | ORAL | Status: DC
  Administered 2011-03-05 – 2011-03-06 (×2): 75 mg via ORAL
  Filled 2011-03-05 (×3): qty 1

## 2011-03-05 MED ORDER — ROSUVASTATIN CALCIUM 5 MG PO TABS
5.0000 mg | ORAL_TABLET | Freq: Every day | ORAL | Status: DC
  Administered 2011-03-05: 5 mg via ORAL
  Filled 2011-03-05 (×2): qty 1

## 2011-03-05 MED ORDER — BENZONATATE 100 MG PO CAPS
100.0000 mg | ORAL_CAPSULE | Freq: Three times a day (TID) | ORAL | Status: DC | PRN
  Filled 2011-03-05: qty 1

## 2011-03-05 MED ORDER — CLONIDINE HCL ER 0.1 MG PO TB12: 0.1000 mg | ORAL_TABLET | Freq: Four times a day (QID) | ORAL | Status: DC | PRN

## 2011-03-05 MED ORDER — ASPIRIN EC 325 MG PO TBEC
325.0000 mg | DELAYED_RELEASE_TABLET | Freq: Every day | ORAL | Status: DC
  Administered 2011-03-05 – 2011-03-06 (×2): 325 mg via ORAL
  Filled 2011-03-05 (×2): qty 1

## 2011-03-05 MED ORDER — ONDANSETRON HCL 4 MG/2ML IJ SOLN: 4.0000 mg | Freq: Four times a day (QID) | INTRAMUSCULAR | Status: DC | PRN

## 2011-03-05 MED ORDER — ONDANSETRON HCL 4 MG PO TABS: 4.0000 mg | ORAL_TABLET | Freq: Four times a day (QID) | ORAL | Status: DC | PRN

## 2011-03-05 MED ORDER — IPRATROPIUM BROMIDE 0.02 % IN SOLN: 0.5000 mg | RESPIRATORY_TRACT | Status: DC | PRN

## 2011-03-05 MED ORDER — ACETAMINOPHEN 650 MG RE SUPP: 650.0000 mg | Freq: Four times a day (QID) | RECTAL | Status: DC | PRN

## 2011-03-05 MED ORDER — PNEUMOCOCCAL VAC POLYVALENT 25 MCG/0.5ML IJ INJ
0.5000 mL | INJECTION | INTRAMUSCULAR | Status: AC
  Administered 2011-03-06: 0.5 mL via INTRAMUSCULAR
  Filled 2011-03-05: qty 0.5

## 2011-03-05 MED ORDER — SIMVASTATIN 20 MG PO TABS
20.0000 mg | ORAL_TABLET | Freq: Every day | ORAL | Status: DC
  Filled 2011-03-05: qty 1

## 2011-03-05 MED ORDER — ANASTROZOLE 1 MG PO TABS
1.0000 mg | ORAL_TABLET | Freq: Every day | ORAL | Status: DC
  Administered 2011-03-05 – 2011-03-06 (×2): 1 mg via ORAL
  Filled 2011-03-05 (×2): qty 1

## 2011-03-05 MED ORDER — OSELTAMIVIR PHOSPHATE 75 MG PO CAPS
75.0000 mg | ORAL_CAPSULE | Freq: Every day | ORAL | Status: DC
  Administered 2011-03-05: 75 mg via ORAL
  Filled 2011-03-05: qty 1

## 2011-03-05 MED ORDER — FUROSEMIDE 10 MG/ML IJ SOLN
40.0000 mg | Freq: Two times a day (BID) | INTRAMUSCULAR | Status: DC
  Administered 2011-03-05 – 2011-03-06 (×3): 40 mg via INTRAVENOUS
  Filled 2011-03-05 (×6): qty 4

## 2011-03-05 MED ORDER — FLUTICASONE PROPIONATE HFA 44 MCG/ACT IN AERO
2.0000 | INHALATION_SPRAY | Freq: Two times a day (BID) | RESPIRATORY_TRACT | Status: DC
  Administered 2011-03-05 – 2011-03-06 (×3): 2 via RESPIRATORY_TRACT
  Filled 2011-03-05: qty 10.6

## 2011-03-05 MED ORDER — MONTELUKAST SODIUM 10 MG PO TABS
10.0000 mg | ORAL_TABLET | Freq: Every day | ORAL | Status: DC
  Administered 2011-03-05: 10 mg via ORAL
  Filled 2011-03-05 (×2): qty 1

## 2011-03-05 MED ORDER — ENOXAPARIN SODIUM 60 MG/0.6ML ~~LOC~~ SOLN
60.0000 mg | SUBCUTANEOUS | Status: DC
  Administered 2011-03-05 – 2011-03-06 (×2): 60 mg via SUBCUTANEOUS
  Filled 2011-03-05 (×2): qty 0.6

## 2011-03-05 MED ORDER — SODIUM CHLORIDE 0.9 % IJ SOLN: 3.0000 mL | INTRAMUSCULAR | Status: DC | PRN

## 2011-03-05 MED ORDER — CYCLOBENZAPRINE HCL 5 MG PO TABS
5.0000 mg | ORAL_TABLET | Freq: Three times a day (TID) | ORAL | Status: DC | PRN
  Filled 2011-03-05: qty 1

## 2011-03-05 MED ORDER — PANTOPRAZOLE SODIUM 40 MG PO TBEC
40.0000 mg | DELAYED_RELEASE_TABLET | Freq: Every day | ORAL | Status: DC
  Administered 2011-03-05 – 2011-03-06 (×2): 40 mg via ORAL
  Filled 2011-03-05: qty 1

## 2011-03-05 MED ORDER — CLONIDINE HCL 0.1 MG PO TABS
0.1000 mg | ORAL_TABLET | Freq: Four times a day (QID) | ORAL | Status: DC | PRN
  Administered 2011-03-05 (×2): 0.1 mg via ORAL
  Filled 2011-03-05 (×2): qty 1

## 2011-03-05 MED ORDER — DILTIAZEM HCL 60 MG PO TABS
60.0000 mg | ORAL_TABLET | Freq: Two times a day (BID) | ORAL | Status: DC
  Administered 2011-03-05: 60 mg via ORAL
  Filled 2011-03-05 (×2): qty 1

## 2011-03-05 MED ORDER — ALBUTEROL SULFATE (5 MG/ML) 0.5% IN NEBU: 2.5000 mg | INHALATION_SOLUTION | RESPIRATORY_TRACT | Status: DC | PRN

## 2011-03-05 NOTE — Progress Notes (Signed)
  2D Echocardiogram has been performed.  Jorje Guild [facility] 03/05/2011, 10:41 AM

## 2011-03-05 NOTE — Consult Note (Signed)
Nutrition education  - Pt admitted with BMI of 43.9, meets criteria for morbid obesity. Pt reports gaining some weight after chemotherapy and radiation and has been eating healthier since them. Pt reports eating baked meats, lots of vegetables, and drinking water as well as monitoring portion sizes. Pt reports getting daily exercise as well. Provided pt with handout of healthy eating information.    Nutrition dx:  Nutrition-related knowledge deficit r/t diet therapy AEB MD request  Intervention:  Brief education;  Provided.  Goals of nutrition therapy discussed.  Understanding confirmed.  RD contact information provided.  Monitoring:  Knowledge; for questions.  Please consult RD if new questions present.  Pager: [phone]

## 2011-03-05 NOTE — Progress Notes (Signed)
PATIENT DETAILS Name: Michaela Little Age: 50 y.o. Sex: female Date of Birth: 04-Oct-1960 Admit Date: 03/04/2011 [id number], Rollene Rotunda, MD, MD  CONSULTS: None  Interval History: Michaela Little is a 50 year old female who presented to the hospital with flulike illness on 03/04/2011. Influenza studies are pending. ROS: The patient complains of myalgias, cough, shortness of breath, and malaise.   Objective: Vital signs in last 24 hours: Temp:  [99.1 F (37.3 C)-103 F (39.4 C)] 99.7 F (37.6 C) (12/10 0600) Pulse Rate:  [93-108] 93  (12/10 0655) Resp:  [18-22] 18  (12/10 0600) BP: (162-184)/(81-99) 177/81 mmHg (12/10 0655) SpO2:  [96 %-98 %] 96 % (12/10 0832) FiO2 (%):  [2 %] 2 % (12/09 1700) Weight:  [127.3 kg (280 lb 10.3 oz)] 280 lb 10.3 oz (127.3 kg) (12/10 0317) Weight change:  Last BM Date: 03/01/11  Intake/Output from previous day:  Intake/Output Summary (Last 24 hours) at 03/05/11 0902 Last data filed at 03/05/11 0601  Gross per 24 hour  Intake   2120 ml  Output   1800 ml  Net    320 ml     Physical Exam:  Gen:  NAD Cardiovascular:  RRR, No M/R/G Respiratory: Lungs diminished bilaterally. Gastrointestinal: Abdomen soft, NT/ND with normal active bowel sounds. Extremities: No C/E/C     Lab Results: Basic Metabolic Panel:  Lab 03/05/11 4098 03/04/11 1851  NA 138 136  K 3.5 3.9  CL 103 102  CO2 26 26  GLUCOSE 112* 114*  BUN 8 7  CREATININE 0.930.93 0.84  CALCIUM 8.9 9.6  MG -- --  PHOS -- --   GFR Estimated Creatinine Clearance: 98.2 ml/min (by C-G formula based on Cr of 0.93).  CBC:  Lab 03/05/11 0435 03/04/11 1851  WBC 5.86.2 7.5  NEUTROABS -- 6.5  HGB 12.612.8 14.1  HCT 38.339.4 42.1  MCV 80.179.6 79.1  PLT 201207 237   Cardiac Enzymes:  Lab 03/05/11 0755 03/05/11 0038  CKTOTAL -- --  CKMB -- --  CKMBINDEX -- --  TROPONINI <0.30 <0.30   BNP:  Lab 03/05/11 0435 03/04/11 2339  POCBNP 213.2* 109.6   Lipid Profile  Basename  03/05/11 0435  CHOL 195  HDL 48  LDLCALC 135*  TRIG 60  CHOLHDL 4.1  LDLDIRECT --    Studies/Results: Dg Chest Port 1 View  03/04/2011  *RADIOLOGY REPORT*  Clinical Data: Cough and fever  PORTABLE CHEST - 1 VIEW  Comparison: 01/31/2011  Findings: 1838 hours.  Lung volumes are low. The cardiopericardial silhouette is enlarged. There is pulmonary vascular congestion without overt pulmonary edema.  No dense focal airspace consolidation. Telemetry leads overlie the chest.  IMPRESSION: Low volume film with vascular congestion.  Original Report Authenticated By: ERIC A. MANSELL, M.D.    Medications: Scheduled Meds:   . anastrozole  1 mg Oral Daily  . aspirin EC  325 mg Oral Daily  . chlorpheniramine-HYDROcodone  5 mL Oral Once  . diltiazem  60 mg Oral BID  . enoxaparin  60 mg Subcutaneous Q24H  . fluticasone  2 puff Inhalation BID  . furosemide  40 mg Intravenous Q12H  . furosemide  40 mg Intravenous Once  . ibuprofen  800 mg Oral Once  . ketorolac  30 mg Intravenous Once  . labetalol  20 mg Intravenous Once  . montelukast  10 mg Oral QHS  . oseltamivir  75 mg Oral Daily  . pantoprazole  40 mg Oral Q1200  . pneumococcal 23 valent vaccine  0.5 mL Intramuscular Tomorrow-1000  .  simvastatin  20 mg Oral QHS  . sodium chloride  1,000 mL Intravenous Once  . sodium chloride  1,000 mL Intravenous Once  . sodium chloride  3 mL Intravenous STAT   Continuous Infusions:  PRN Meds:.acetaminophen, acetaminophen, albuterol, benzonatate, cloNIDine, cyclobenzaprine, HYDROcodone-acetaminophen, ipratropium, ondansetron (ZOFRAN) IV, ondansetron, sodium chloride, DISCONTD: acetaminophen, DISCONTD: cloNIDine HCl Antibiotics: Anti-infectives     Start     Dose/Rate Route Frequency Ordered Stop   03/05/11 1000   oseltamivir (TAMIFLU) capsule 75 mg        75 mg Oral Daily 03/05/11 0324             Assessment/Plan:  Principal Problem:  *Influenza Assessment: Probable influenza. Plan: Followup  influenza studies and sputum culture. Continue Tamiflu, day #2 of 5 and symptomatic treatment. Active Problems:  Asthma Assessment: No bronchospasm on exam. Plan: Continue to monitor closely. Bronchodilator therapy as needed.  Continue Flovent and Singulair.  OSA (obstructive sleep apnea)/OHS/Obesity Assessment: ? Morbid obesity Plan: Dietician consult.  Check BMI.  Hypertension Assessment: Uncontrolled. Plan: Increase Cardizem.  On higher doses of home lasix.  F/U 2 D Echo.  History of breast cancer Assessment: Stable. Plan: Continue Arimidex.    LOS: 1 day   Michaela Aldo, MD Pager [phone]  03/05/2011, 9:02 AM

## 2011-03-05 NOTE — Progress Notes (Signed)
Patient transferred from ED to 1414. Alert and oriented X3 ambulated from the stretcher outside the room to the bed. Denies any pain but C/O weakness, some productive cough. Temp 99.1 and BP 172/84, blood pressure medication due to  Be given. On droplet prec. Flu PCR screen pending. Skin intact, No wound.

## 2011-03-06 DIAGNOSIS — I5189 Other ill-defined heart diseases: Secondary | ICD-10-CM | POA: Diagnosis present

## 2011-03-06 LAB — CBC
HCT: 37.8 % (ref 36.0–46.0)
MCH: 26.5 pg (ref 26.0–34.0)
MCHC: 33.3 g/dL (ref 30.0–36.0)
MCV: 79.4 fL (ref 78.0–100.0)
RDW: 15.2 % (ref 11.5–15.5)

## 2011-03-06 LAB — COMPREHENSIVE METABOLIC PANEL
ALT: 13 U/L (ref 0–35)
AST: 25 U/L (ref 0–37)
Albumin: 3.4 g/dL — ABNORMAL LOW (ref 3.5–5.2)
Alkaline Phosphatase: 95 U/L (ref 39–117)
Chloride: 98 mEq/L (ref 96–112)
Total Bilirubin: 0.9 mg/dL (ref 0.3–1.2)
Total Protein: 7.2 g/dL (ref 6.0–8.3)

## 2011-03-06 MED ORDER — PREDNISONE (PAK) 10 MG PO TABS: ORAL_TABLET | ORAL | Status: DC

## 2011-03-06 MED ORDER — PREDNISONE (PAK) 10 MG PO TABS: ORAL_TABLET | ORAL | Status: AC

## 2011-03-06 MED ORDER — OSELTAMIVIR PHOSPHATE 75 MG PO CAPS: 75.0000 mg | ORAL_CAPSULE | Freq: Two times a day (BID) | ORAL | Status: AC

## 2011-03-06 MED ORDER — POLYETHYLENE GLYCOL 3350 17 G PO PACK
17.0000 g | PACK | Freq: Every day | ORAL | Status: DC
  Filled 2011-03-06: qty 1

## 2011-03-06 MED ORDER — OSELTAMIVIR PHOSPHATE 75 MG PO CAPS: 75.0000 mg | ORAL_CAPSULE | Freq: Two times a day (BID) | ORAL | Status: DC

## 2011-03-06 NOTE — Progress Notes (Signed)
UR complete 

## 2011-03-06 NOTE — Clinical Documentation Improvement (Signed)
BMI DOCUMENTATION CLARIFICATION QUERY  THIS DOCUMENT IS NOT A PERMANENT PART OF THE MEDICAL RECORD  TO RESPOND TO THE THIS QUERY, FOLLOW THE INSTRUCTIONS BELOW:  1. If needed, update documentation for the patient's encounter via the notes activity.  2. Access this query again and click edit on the [employer].  3. After updating, or not, click F2 to complete all highlighted (required) fields concerning your review. Select "additional documentation in the medical record" OR "no additional documentation provided".  4. Click Sign note button.  5. The deficiency will fall out of your InBasket *Please let us know if you are not able to compete this workflow by phone or e-mail (listed below).         03/06/11  Dear Dr. Darnelle Catalan Marton [facility]  In an effort to better capture your patient's severity of illness, reflect appropriate length of stay and utilization of resources, a review of the patient medical record has revealed the following indicators.    Based on your clinical judgment, please clarify and document in a progress note and/or discharge summary the clinical condition associated with the following supporting information:  In responding to this query please exercise your independent judgment.  The fact that a query is asked, does not imply that any particular answer is desired or expected.  Possible Clinical conditions  Based on the patient's current clinical presentation please clarify:  Pt's BMI=   44. Please clarify whether or not BMI can be linked to one of he diagnoses listed below and document in pn  and d/c. Thank You!  [facility]: When linking BMI to a diagnosis please document both BMI and diagnosis together in pn for accuracy of SOI and ROM.  Diagnoses  Morbid Obesity W/ BMI=   Underweight w/BMI=  Other condition___________________  Cannot Clinically determine _____________  Risk Factors: Sign & Symptoms: Weight: 280 lbs Height 5'7" BMI=  44  Treatment Monitoring    Reviewed: additional documentation in the medical record  Thank You,  Enis Slipper  RN, BSN, CCDS Clinical Documentation Specialist [facility] Dept Pager: [phone] / E-mail: [email or web address]   Health Information Management [place]

## 2011-03-06 NOTE — Progress Notes (Signed)
Patient called RN to room for feeling of SOB and slight dizziness. Vitals were obtained bp 122/79 hr 80, temp 98.2 and oxygen 96% on room air, rr 14. Patient does not appear short of breath upon assessment. Will continue to monitor patient.

## 2011-03-06 NOTE — Discharge Summary (Addendum)
Physician Discharge Summary  Patient ID: Michaela Little MRN: [id number] DOB/AGE: 52-May-1962 52 y.o.  Admit date: 03/04/2011 Discharge date: 03/06/2011  Primary Care Physician:  Oneal Grout, MD, MD   Discharge Diagnoses:    Present on Admission:  .Influenza .Asthma .Breast cancer .OSA (obstructive sleep apnea) -Morbid obesity  Discharge Medications:  Current Discharge Medication List    START taking these medications   Details  oseltamivir (TAMIFLU) 75 MG capsule Take 1 capsule (75 mg total) by mouth 2 (two) times daily. Qty: 8 capsule, Refills: 0    predniSONE (STERAPRED UNI-PAK) 10 MG tablet Take 6 tabs on 03/07/2011. Decreased by 1 tablet daily until gone. Qty: 21 tablet, Refills: 0      CONTINUE these medications which have NOT CHANGED   Details  albuterol (VENTOLIN HFA) 108 (90 BASE) MCG/ACT inhaler Inhale 2 puffs into the lungs every 6 (six) hours as needed for wheezing. Qty: 1 Inhaler, Refills: 2    anastrozole (ARIMIDEX) 1 MG tablet Take 1 mg by mouth daily.     cyclobenzaprine (FLEXERIL) 5 MG tablet Take 5 mg by mouth 3 (three) times daily as needed.     diltiazem (CARDIZEM) 60 MG tablet Take 60 mg by mouth 2 (two) times daily.     fluticasone (FLOVENT HFA) 44 MCG/ACT inhaler Inhale 2 puffs into the lungs 2 (two) times daily.      furosemide (LASIX) 40 MG tablet Take 40 mg by mouth daily as needed. For fluid retention    HYDROcodone-acetaminophen (NORCO) 5-325 MG per tablet One to two tablets every 4-6 hours prn pain Qty: 12 tablet, Refills: 0    ipratropium-albuterol (DUONEB) 0.5-2.5 (3) MG/3ML SOLN Take 3 mLs by nebulization every 4 (four) hours as needed.      loratadine (CLARITIN) 10 MG tablet Take 10 mg by mouth daily.     montelukast (SINGULAIR) 10 MG tablet Take 10 mg by mouth at bedtime.     simvastatin (ZOCOR) 20 MG tablet Take 20 mg by mouth at bedtime.     traMADol (ULTRAM) 50 MG tablet Take 50-100 mg by mouth every 6 (six) hours as needed.  1 to 2 every 8 hours as needed    Vitamin D, Ergocalciferol, (DRISDOL) 50000 UNITS CAPS Take 50,000 Units by mouth every 7 (seven) days. Patient states that she takes once a week for 6 weeks. Could not get filled because she had no money.         Disposition and Follow-up: The patient is being discharged home. She is encouraged to followup with her primary care physician in 2 weeks.  Consults: None  Significant Diagnostic Studies:   1.  Dg Chest Port 1 View on 03/04/2011   *RADIOLOGY REPORT*  Clinical Data: Cough and fever  PORTABLE CHEST - 1 VIEW  Comparison: 01/31/2011  Findings: 1838 hours.  Lung volumes are low. The cardiopericardial silhouette is enlarged. There is pulmonary vascular congestion without overt pulmonary edema.  No dense focal airspace consolidation. Telemetry leads overlie the chest.  IMPRESSION: Low volume film with vascular congestion.  Original Report Authenticated By: ERIC A. MANSELL, M.D.   2. Two-dimensional echocardiogram on 03/05/2011 Study Conclusions  Left ventricle: The cavity size was normal. There was mild concentric hypertrophy. Systolic function was normal. The estimated ejection fraction was in the range of 55% to 60%. There was an increased relative contribution of atrial contraction to ventricular filling. Doppler parameters are consistent with abnormal left ventricular relaxation (grade 1 diastolic dysfunction).    Discharge Laboratory Values:  Basic Metabolic Panel:  Lab 03/06/11 4098 03/05/11 0435 03/04/11 1851  NA 135 138 136  K 3.2* 3.5 --  CL 98 103 102  CO2 28 26 26   GLUCOSE 100* 112* 114*  BUN 13 8 7   CREATININE 0.97 0.930.93 0.84  CALCIUM 9.1 8.9 9.6  MG -- -- --  PHOS -- -- --   GFR Estimated Creatinine Clearance: 94.1 ml/min (by C-G formula based on Cr of 0.97). Liver Function Tests:  Lab 03/06/11 0434  AST 25  ALT 13  ALKPHOS 95  BILITOT 0.9  PROT 7.2  ALBUMIN 3.4*    CBC:  Lab 03/06/11 0434 03/05/11 0435  03/04/11 1851  WBC 3.3* 5.86.2 7.5  NEUTROABS -- -- 6.5  HGB 12.6 12.612.8 14.1  HCT 37.8 38.339.4 42.1  MCV 79.4 80.179.6 79.1  PLT 211 201207 237   Cardiac Enzymes:  Lab 03/05/11 1619 03/05/11 0755 03/05/11 0038  CKTOTAL -- -- --  CKMB -- -- --  CKMBINDEX -- -- --  TROPONINI <0.30 <0.30 <0.30   Lipid Profile  Basename 03/05/11 0435  CHOL 195  HDL 48  LDLCALC 135*  TRIG 60  CHOLHDL 4.1  LDLDIRECT --   Microbiology Recent Results (from the past 240 hour(s))  CULTURE, SPUTUM-ASSESSMENT     Status: Normal   Collection Time   03/05/11  8:15 PM      Component Value Range Status Comment   Specimen Description SPUTUM   Final    Special Requests NONE   Final    Sputum evaluation     Final    Value: THIS SPECIMEN IS ACCEPTABLE. RESPIRATORY CULTURE REPORT TO FOLLOW.   Report Status 03/05/2011 FINAL   Final   CULTURE, RESPIRATORY     Status: Normal (Preliminary result)   Collection Time   03/05/11  8:15 PM      Component Value Range Status Comment   Specimen Description SPUTUM   Final    Special Requests NONE   Final    Gram Stain     Final    Value: ABUNDANT WBC PRESENT,BOTH PMN AND MONONUCLEAR     FEW SQUAMOUS EPITHELIAL CELLS PRESENT     FEW GPC.IPR IN CLUSTERS IN CHAINS   Culture PENDING   Incomplete    Report Status PENDING   Incomplete      Brief H and P: For complete details please refer to admission H and P, but in brief, Ms. Fort is a 50 year old female with a past medical history of asthma who presented to the hospital with flulike illness on 03/04/2011. Because of some hypoxia, she was referred to the hospitalist service for admission.   Physical Exam at Discharge: BP 143/75  Pulse 75  Temp(Src) 98.8 F (37.1 C) (Oral)  Resp 20  Ht 5\' 7"  (1.702 m)  Wt 127.3 kg (280 lb 10.3 oz)  BMI 43.96 kg/m2  SpO2 100% Gen: NAD  Cardiovascular: RRR, No M/R/G  Respiratory: Lungs diminished bilaterally.  Gastrointestinal: Abdomen soft, NT/ND with normal  active bowel sounds.  Extremities: No C/E/C   Hospital Course:  Principal Problem:  *Influenza Patient was admitted and influenza studies were ordered. She was put on empiric Tamiflu. Influenza A PCR came back positive. This point, the plan is to discharge her home on a steroid taper. She was able to maintain her oxygen saturations with ambulation today. Active Problems:  Asthma Patient was maintained on her usual home medications. We will discharge her on a prednisone taper.  OSA (obstructive sleep apnea)/OHS/  Morbid Obesity Patient was seen and evaluated by the dietitian. She has a BMI of 43.9, consistent with morbid obesity. She was given dietary instruction regarding strategies to help her lose weight.  Hypertension Patient's blood pressure is well-controlled. She'll be discharged on her usual home dose of Cardizem. She can also continue Lasix.  History of breast cancer Stable, continue Arimidex.   Diet: Heart healthy reduced calorie. Activity: No restrictions Time spent on Discharge: 35 minutes  Signed: Dr. Trula Ore Rama Pager [phone] 03/06/2011, 2:00 PM

## 2011-03-07 ENCOUNTER — Ambulatory Visit: Payer: Medicare Other | Admitting: Physical Therapy

## 2011-03-07 NOTE — Progress Notes (Signed)
Late entry for 03-06-11. Spoke with pt's nurse who states pt will need HHC services. Spoke with pt on the phone who states she needs HHC RN and HHA. Pt has questions and issues regarding medicaid services. Pt chose [facility] for [facility] services. Called Norberta Keens to make aware of referral and made MD aware of orders and face to face. Pt will f/u with Dr. Glade Lloyd as her PCP.   [address] RN,BSN, [place] [phone]

## 2011-03-08 DIAGNOSIS — M1990 Unspecified osteoarthritis, unspecified site: Secondary | ICD-10-CM | POA: Diagnosis not present

## 2011-03-08 DIAGNOSIS — J111 Influenza due to unidentified influenza virus with other respiratory manifestations: Secondary | ICD-10-CM | POA: Diagnosis not present

## 2011-03-08 DIAGNOSIS — I10 Essential (primary) hypertension: Secondary | ICD-10-CM | POA: Diagnosis not present

## 2011-03-08 DIAGNOSIS — J45909 Unspecified asthma, uncomplicated: Secondary | ICD-10-CM | POA: Diagnosis not present

## 2011-03-08 DIAGNOSIS — G4733 Obstructive sleep apnea (adult) (pediatric): Secondary | ICD-10-CM | POA: Diagnosis not present

## 2011-03-08 DIAGNOSIS — C50919 Malignant neoplasm of unspecified site of unspecified female breast: Secondary | ICD-10-CM | POA: Diagnosis not present

## 2011-03-08 LAB — CULTURE, RESPIRATORY W GRAM STAIN

## 2011-03-09 DIAGNOSIS — I10 Essential (primary) hypertension: Secondary | ICD-10-CM | POA: Diagnosis not present

## 2011-03-09 DIAGNOSIS — J45909 Unspecified asthma, uncomplicated: Secondary | ICD-10-CM | POA: Diagnosis not present

## 2011-03-09 DIAGNOSIS — C50919 Malignant neoplasm of unspecified site of unspecified female breast: Secondary | ICD-10-CM | POA: Diagnosis not present

## 2011-03-09 DIAGNOSIS — G4733 Obstructive sleep apnea (adult) (pediatric): Secondary | ICD-10-CM | POA: Diagnosis not present

## 2011-03-09 DIAGNOSIS — J111 Influenza due to unidentified influenza virus with other respiratory manifestations: Secondary | ICD-10-CM | POA: Diagnosis not present

## 2011-03-09 DIAGNOSIS — M1990 Unspecified osteoarthritis, unspecified site: Secondary | ICD-10-CM | POA: Diagnosis not present

## 2011-03-11 DIAGNOSIS — M1990 Unspecified osteoarthritis, unspecified site: Secondary | ICD-10-CM | POA: Diagnosis not present

## 2011-03-11 DIAGNOSIS — J111 Influenza due to unidentified influenza virus with other respiratory manifestations: Secondary | ICD-10-CM | POA: Diagnosis not present

## 2011-03-11 DIAGNOSIS — G4733 Obstructive sleep apnea (adult) (pediatric): Secondary | ICD-10-CM | POA: Diagnosis not present

## 2011-03-11 DIAGNOSIS — J45909 Unspecified asthma, uncomplicated: Secondary | ICD-10-CM | POA: Diagnosis not present

## 2011-03-11 DIAGNOSIS — C50919 Malignant neoplasm of unspecified site of unspecified female breast: Secondary | ICD-10-CM | POA: Diagnosis not present

## 2011-03-11 DIAGNOSIS — I10 Essential (primary) hypertension: Secondary | ICD-10-CM | POA: Diagnosis not present

## 2011-03-12 DIAGNOSIS — C50919 Malignant neoplasm of unspecified site of unspecified female breast: Secondary | ICD-10-CM | POA: Diagnosis not present

## 2011-03-12 DIAGNOSIS — G4733 Obstructive sleep apnea (adult) (pediatric): Secondary | ICD-10-CM | POA: Diagnosis not present

## 2011-03-12 DIAGNOSIS — M1990 Unspecified osteoarthritis, unspecified site: Secondary | ICD-10-CM | POA: Diagnosis not present

## 2011-03-12 DIAGNOSIS — I10 Essential (primary) hypertension: Secondary | ICD-10-CM | POA: Diagnosis not present

## 2011-03-12 DIAGNOSIS — J45909 Unspecified asthma, uncomplicated: Secondary | ICD-10-CM | POA: Diagnosis not present

## 2011-03-12 DIAGNOSIS — J111 Influenza due to unidentified influenza virus with other respiratory manifestations: Secondary | ICD-10-CM | POA: Diagnosis not present

## 2011-03-13 DIAGNOSIS — I10 Essential (primary) hypertension: Secondary | ICD-10-CM | POA: Diagnosis not present

## 2011-03-13 DIAGNOSIS — J111 Influenza due to unidentified influenza virus with other respiratory manifestations: Secondary | ICD-10-CM | POA: Diagnosis not present

## 2011-03-13 DIAGNOSIS — M1990 Unspecified osteoarthritis, unspecified site: Secondary | ICD-10-CM | POA: Diagnosis not present

## 2011-03-13 DIAGNOSIS — C50919 Malignant neoplasm of unspecified site of unspecified female breast: Secondary | ICD-10-CM | POA: Diagnosis not present

## 2011-03-13 DIAGNOSIS — G4733 Obstructive sleep apnea (adult) (pediatric): Secondary | ICD-10-CM | POA: Diagnosis not present

## 2011-03-13 DIAGNOSIS — J45909 Unspecified asthma, uncomplicated: Secondary | ICD-10-CM | POA: Diagnosis not present

## 2011-03-15 DIAGNOSIS — J111 Influenza due to unidentified influenza virus with other respiratory manifestations: Secondary | ICD-10-CM | POA: Diagnosis not present

## 2011-03-15 DIAGNOSIS — J45909 Unspecified asthma, uncomplicated: Secondary | ICD-10-CM | POA: Diagnosis not present

## 2011-03-15 DIAGNOSIS — I10 Essential (primary) hypertension: Secondary | ICD-10-CM | POA: Diagnosis not present

## 2011-03-15 DIAGNOSIS — G4733 Obstructive sleep apnea (adult) (pediatric): Secondary | ICD-10-CM | POA: Diagnosis not present

## 2011-03-15 DIAGNOSIS — M1990 Unspecified osteoarthritis, unspecified site: Secondary | ICD-10-CM | POA: Diagnosis not present

## 2011-03-15 DIAGNOSIS — C50919 Malignant neoplasm of unspecified site of unspecified female breast: Secondary | ICD-10-CM | POA: Diagnosis not present

## 2011-03-17 DIAGNOSIS — G4733 Obstructive sleep apnea (adult) (pediatric): Secondary | ICD-10-CM | POA: Diagnosis not present

## 2011-03-17 DIAGNOSIS — J111 Influenza due to unidentified influenza virus with other respiratory manifestations: Secondary | ICD-10-CM | POA: Diagnosis not present

## 2011-03-17 DIAGNOSIS — M1990 Unspecified osteoarthritis, unspecified site: Secondary | ICD-10-CM | POA: Diagnosis not present

## 2011-03-17 DIAGNOSIS — J45909 Unspecified asthma, uncomplicated: Secondary | ICD-10-CM | POA: Diagnosis not present

## 2011-03-17 DIAGNOSIS — I10 Essential (primary) hypertension: Secondary | ICD-10-CM | POA: Diagnosis not present

## 2011-03-17 DIAGNOSIS — C50919 Malignant neoplasm of unspecified site of unspecified female breast: Secondary | ICD-10-CM | POA: Diagnosis not present

## 2011-03-18 ENCOUNTER — Emergency Department (HOSPITAL_COMMUNITY)
Admission: EM | Admit: 2011-03-18 | Discharge: 2011-03-18 | Disposition: A | Discharge status: Home / Self Care | Payer: Medicare Other | Attending: Emergency Medicine | Admitting: Emergency Medicine

## 2011-03-18 ENCOUNTER — Encounter (HOSPITAL_COMMUNITY): Payer: Self-pay | Admitting: Emergency Medicine

## 2011-03-18 DIAGNOSIS — Z853 Personal history of malignant neoplasm of breast: Secondary | ICD-10-CM | POA: Insufficient documentation

## 2011-03-18 DIAGNOSIS — R300 Dysuria: Secondary | ICD-10-CM | POA: Insufficient documentation

## 2011-03-18 DIAGNOSIS — E669 Obesity, unspecified: Secondary | ICD-10-CM | POA: Insufficient documentation

## 2011-03-18 DIAGNOSIS — N390 Urinary tract infection, site not specified: Secondary | ICD-10-CM | POA: Insufficient documentation

## 2011-03-18 DIAGNOSIS — R10819 Abdominal tenderness, unspecified site: Secondary | ICD-10-CM | POA: Insufficient documentation

## 2011-03-18 DIAGNOSIS — I10 Essential (primary) hypertension: Secondary | ICD-10-CM | POA: Insufficient documentation

## 2011-03-18 LAB — POCT I-STAT, CHEM 8
BUN: 9 mg/dL (ref 6–23)
Creatinine, Ser: 0.9 mg/dL (ref 0.50–1.10)
Hemoglobin: 13.9 g/dL (ref 12.0–15.0)
Potassium: 4.5 mEq/L (ref 3.5–5.1)
Sodium: 141 mEq/L (ref 135–145)

## 2011-03-18 LAB — URINE MICROSCOPIC-ADD ON

## 2011-03-18 LAB — PREGNANCY, URINE: Preg Test, Ur: NEGATIVE

## 2011-03-18 LAB — URINALYSIS, ROUTINE W REFLEX MICROSCOPIC
Glucose, UA: NEGATIVE mg/dL
Protein, ur: >300 mg/dL — AB
Specific Gravity, Urine: 1.027 (ref 1.005–1.030)
pH: 5.5 (ref 5.0–8.0)

## 2011-03-18 MED ORDER — CEPHALEXIN 500 MG PO CAPS: 500.0000 mg | ORAL_CAPSULE | Freq: Three times a day (TID) | ORAL | Status: AC

## 2011-03-18 MED ORDER — PHENAZOPYRIDINE HCL 200 MG PO TABS
200.0000 mg | ORAL_TABLET | Freq: Once | ORAL | Status: AC
  Administered 2011-03-18: 200 mg via ORAL
  Filled 2011-03-18: qty 1

## 2011-03-18 MED ORDER — ONDANSETRON 8 MG PO TBDP
8.0000 mg | ORAL_TABLET | Freq: Once | ORAL | Status: AC
  Administered 2011-03-18: 8 mg via ORAL
  Filled 2011-03-18: qty 1

## 2011-03-18 MED ORDER — PHENAZOPYRIDINE HCL 200 MG PO TABS: 200.0000 mg | ORAL_TABLET | Freq: Three times a day (TID) | ORAL | Status: AC

## 2011-03-18 MED ORDER — CEPHALEXIN 500 MG PO CAPS
500.0000 mg | ORAL_CAPSULE | Freq: Once | ORAL | Status: AC
  Administered 2011-03-18: 500 mg via ORAL
  Filled 2011-03-18: qty 1

## 2011-03-18 NOTE — ED Notes (Signed)
Per EMS: Pt comes from home with c/o painful urination, decrease in flow, increased frequency, and orange-colored urine. Pt states this began 2 weeks ago.

## 2011-03-18 NOTE — ED Notes (Signed)
ZOX:[id number]<AV> Expected date:03/18/11<BR> Expected time: 8:23 PM<BR> Means of arrival:Ambulance<BR> Comments:<BR> EMS 211 GC = painful urination- RM 25

## 2011-03-18 NOTE — ED Provider Notes (Signed)
History     CSN: [id number]  Arrival date & time 03/18/11  2026   First MD Initiated Contact with Patient 03/18/11 2115      Chief Complaint  Patient presents with  . Dysuria    (Consider location/radiation/quality/duration/timing/severity/associated sxs/prior treatment) HPI  Patient relates about 3 hours ago she started having the urge to urinate and has pressure with dysuria frequency and blood when she wipes. She has nausea without vomiting. She has some suprapubic discomfort. She states she's had it before and relates it to her retroverted uterus.  Patient states she's stage III breast cancer survivor and she also had some abnormal cells on a Pap smear and had a cone biopsy done in [place] before she moved here 3 months ago. She states she has an appointment with the gynecologist in March  PCP Dr. Sander Radon  Past Medical History  Diagnosis Date  . Uterine fibroid   . Hyperlipidemia   . Hypertension   . Asthma   . OSA (obstructive sleep apnea)   . Arthritis   . Breast cancer     Past Surgical History  Procedure Date  . Eye surgery 1961    right  . Breast surgery   . Mastectomy     Family History  Problem Relation Age of Onset  . Breast cancer Mother   . Colon cancer Mother   . Hypotension Mother   . Asthma Mother   . Diabetes type II Mother   . Arthritis Mother   . Mental illness Brother   . Heart disease Brother   . Cerebral palsy Daughter   . Emphysema Brother     never smoker    History  Substance Use Topics  . Smoking status: Former Smoker -- 0.3 packs/day for 10 years    Types: Cigarettes    Quit date: 03/26/1988  . Smokeless tobacco: Never Used  . Alcohol Use: No   on disability for arthritis in her legs  OB History    Grav Para Term Preterm Abortions TAB SAB Ect Mult Living                  Review of Systems  All other systems reviewed and are negative.    Allergies  Tums  Home Medications   Current Outpatient Rx  Name Route  Sig Dispense Refill  . ALBUTEROL SULFATE HFA 108 (90 BASE) MCG/ACT IN AERS Inhalation Inhale 2 puffs into the lungs every 6 (six) hours as needed for wheezing. 1 Inhaler 2  . ANASTROZOLE 1 MG PO TABS Oral Take 1 mg by mouth daily.     . CYCLOBENZAPRINE HCL 5 MG PO TABS Oral Take 5 mg by mouth 3 (three) times daily as needed.     Marland Kitchen DILTIAZEM HCL 60 MG PO TABS Oral Take 60 mg by mouth 2 (two) times daily.     Marland Kitchen FLUTICASONE PROPIONATE  HFA 44 MCG/ACT IN AERO Inhalation Inhale 2 puffs into the lungs 2 (two) times daily.      . FUROSEMIDE 40 MG PO TABS Oral Take 40 mg by mouth daily as needed. For fluid retention    . HYDROCODONE-ACETAMINOPHEN 5-325 MG PO TABS  One to two tablets every 4-6 hours prn pain 12 tablet 0  . IPRATROPIUM-ALBUTEROL 0.5-2.5 (3) MG/3ML IN SOLN Nebulization Take 3 mLs by nebulization every 4 (four) hours as needed.      Marland Kitchen LORATADINE 10 MG PO TABS Oral Take 10 mg by mouth daily.     Marland Kitchen  MONTELUKAST SODIUM 10 MG PO TABS Oral Take 10 mg by mouth at bedtime.     Marland Kitchen SIMVASTATIN 20 MG PO TABS Oral Take 20 mg by mouth at bedtime.     . TRAMADOL HCL 50 MG PO TABS Oral Take 50-100 mg by mouth every 6 (six) hours as needed. 1 to 2 every 8 hours as needed    . VITAMIN D (ERGOCALCIFEROL) 50000 UNITS PO CAPS Oral Take 50,000 Units by mouth every 7 (seven) days. Patient states that she takes once a week for 6 weeks. Could not get filled because she had no money.      BP 134/81  Pulse 98  Temp(Src) 99.2 F (37.3 C) (Oral)  Resp 22  SpO2 99% Vital signs normal  Physical Exam  Nursing note and vitals reviewed. Constitutional: She is oriented to person, place, and time. She appears well-developed and well-nourished.  Non-toxic appearance. She does not appear ill. No distress.       Obese Patient keeps saying under her breath [address] [place]"  HENT:  Head: Normocephalic and atraumatic.  Right Ear: External ear normal.  Left Ear: External ear normal.  Nose: Nose normal. No  mucosal edema or rhinorrhea.  Mouth/Throat: Oropharynx is clear and moist and mucous membranes are normal. No dental abscesses or uvula swelling.  Eyes: Conjunctivae and EOM are normal. Pupils are equal, round, and reactive to light.  Neck: Normal range of motion and full passive range of motion without pain. Neck supple.  Cardiovascular: Normal rate, regular rhythm and normal heart sounds.  Exam reveals no gallop and no friction rub.   No murmur heard. Pulmonary/Chest: Effort normal and breath sounds normal. No respiratory distress. She has no wheezes. She has no rhonchi. She has no rales. She exhibits no tenderness and no crepitus.  Abdominal: Soft. Normal appearance and bowel sounds are normal. She exhibits no distension. There is tenderness. There is no rebound and no guarding.       Is over suprapubic area  Musculoskeletal: Normal range of motion. She exhibits no edema and no tenderness.       Moves all extremities well.   Neurological: She is alert and oriented to person, place, and time. She has normal strength. No cranial nerve deficit.  Skin: Skin is warm, dry and intact. No rash noted. No erythema. No pallor.  Psychiatric: She has a normal mood and affect. Her speech is normal and behavior is normal. Her mood appears not anxious.    ED Course  Procedures (including critical care time)  Pt given pyridium and keflex for her UTI  Results for orders placed during the hospital encounter of 03/18/11  URINALYSIS, ROUTINE W REFLEX MICROSCOPIC      Component Value Range   Color, Urine AMBER (*) YELLOW    APPearance TURBID (*) CLEAR    Specific Gravity, Urine 1.027  1.005 - 1.030    pH 5.5  5.0 - 8.0    Glucose, UA NEGATIVE  NEGATIVE (mg/dL)   Hgb urine dipstick LARGE (*) NEGATIVE    Bilirubin Urine SMALL (*) NEGATIVE    Ketones, ur TRACE (*) NEGATIVE (mg/dL)   Protein, ur >161 (*) NEGATIVE (mg/dL)   Urobilinogen, UA 1.0  0.0 - 1.0 (mg/dL)   Nitrite NEGATIVE  NEGATIVE     Leukocytes, UA LARGE (*) NEGATIVE   PREGNANCY, URINE      Component Value Range   Preg Test, Ur NEGATIVE    URINE MICROSCOPIC-ADD ON  Component Value Range   WBC, UA TOO NUMEROUS TO COUNT  <3 (WBC/hpf)   Urine-Other FIELD OBSCURED BY WBC'S    POCT I-STAT, CHEM 8      Component Value Range   Sodium 141  135 - 145 (mEq/L)   Potassium 4.5  3.5 - 5.1 (mEq/L)   Chloride 105  96 - 112 (mEq/L)   BUN 9  6 - 23 (mg/dL)   Creatinine, Ser 0.86  0.50 - 1.10 (mg/dL)   Glucose, Bld 578 (*) 70 - 99 (mg/dL)   Calcium, Ion 4.69  6.29 - 1.32 (mmol/L)   TCO2 27  0 - 100 (mmol/L)   Hemoglobin 13.9  12.0 - 15.0 (g/dL)   HCT 52.8  41.3 - 24.4 (%)   Laboratory interpretation all normal except urinary tract infection   Ct Abdomen Pelvis W Contrast  02/21/2011  *RADIOLOGY REPORT*  Clinical Data: Abdominal pain and nausea.  History of breast cancer.  CT ABDOMEN AND PELVIS WITH CONTRAST  Technique:  Multidetector CT imaging of the abdomen and pelvis was performed following the standard protocol during bolus administration of intravenous contrast.  Contrast:  100 ml Omnipaque-300 IV  Comparison: None.  Findings: Left breast implant present.  The liver, gallbladder, pancreas, spleen, adrenal glands and kidneys are within normal limits.  Bowel shows no evidence of obstruction or inflammation. No evidence of free fluid or abscess.  No free air.  No enlarged lymph nodes are identified.  The uterus shows focal subserosal protrusion from the top of the fundus likely representing a uterine fibroid and measuring approximately 3.7 cm in diameter.  The bladder is decompressed and unremarkable.  Bony structures are within normal limits.  IMPRESSION: No acute findings.  Probable fundal uterine fibroid.  Original Report Authenticated By: Reola Calkins, M.D.   Dg Chest Port 1 View  03/04/2011  *RADIOLOGY REPORT*  Clinical Data: Cough and fever  PORTABLE CHEST - 1 VIEW  Comparison: 01/31/2011  Findings: 1838 hours.  Lung  volumes are low. The cardiopericardial silhouette is enlarged. There is pulmonary vascular congestion without overt pulmonary edema.  No dense focal airspace consolidation. Telemetry leads overlie the chest.  IMPRESSION: Low volume film with vascular congestion.  Original Report Authenticated By: ERIC A. MANSELL, M.D.     Diagnoses that have been ruled out:  Diagnoses that are still under consideration:  Final diagnoses:  Urinary tract infection    New Prescriptions   CEPHALEXIN (KEFLEX) 500 MG CAPSULE    Take 1 capsule (500 mg total) by mouth 3 (three) times daily.   PHENAZOPYRIDINE (PYRIDIUM) 200 MG TABLET    Take 1 tablet (200 mg total) by mouth 3 (three) times daily.    Plan discharge  Devoria Albe, MD, FACEP    MDM          Ward Givens, MD 03/18/11 2312

## 2011-03-19 DIAGNOSIS — J45909 Unspecified asthma, uncomplicated: Secondary | ICD-10-CM | POA: Diagnosis not present

## 2011-03-19 DIAGNOSIS — M1990 Unspecified osteoarthritis, unspecified site: Secondary | ICD-10-CM | POA: Diagnosis not present

## 2011-03-19 DIAGNOSIS — I10 Essential (primary) hypertension: Secondary | ICD-10-CM | POA: Diagnosis not present

## 2011-03-19 DIAGNOSIS — J111 Influenza due to unidentified influenza virus with other respiratory manifestations: Secondary | ICD-10-CM | POA: Diagnosis not present

## 2011-03-19 DIAGNOSIS — G4733 Obstructive sleep apnea (adult) (pediatric): Secondary | ICD-10-CM | POA: Diagnosis not present

## 2011-03-19 DIAGNOSIS — C50919 Malignant neoplasm of unspecified site of unspecified female breast: Secondary | ICD-10-CM | POA: Diagnosis not present

## 2011-03-21 DIAGNOSIS — G4733 Obstructive sleep apnea (adult) (pediatric): Secondary | ICD-10-CM | POA: Diagnosis not present

## 2011-03-21 DIAGNOSIS — J111 Influenza due to unidentified influenza virus with other respiratory manifestations: Secondary | ICD-10-CM | POA: Diagnosis not present

## 2011-03-21 DIAGNOSIS — I10 Essential (primary) hypertension: Secondary | ICD-10-CM | POA: Diagnosis not present

## 2011-03-21 DIAGNOSIS — M1990 Unspecified osteoarthritis, unspecified site: Secondary | ICD-10-CM | POA: Diagnosis not present

## 2011-03-21 DIAGNOSIS — J45909 Unspecified asthma, uncomplicated: Secondary | ICD-10-CM | POA: Diagnosis not present

## 2011-03-21 DIAGNOSIS — C50919 Malignant neoplasm of unspecified site of unspecified female breast: Secondary | ICD-10-CM | POA: Diagnosis not present

## 2011-03-22 ENCOUNTER — Emergency Department (HOSPITAL_COMMUNITY): Payer: Medicare Other

## 2011-03-22 ENCOUNTER — Encounter (HOSPITAL_COMMUNITY): Payer: Self-pay | Admitting: *Deleted

## 2011-03-22 ENCOUNTER — Emergency Department (HOSPITAL_COMMUNITY)
Admission: EM | Admit: 2011-03-22 | Discharge: 2011-03-22 | Disposition: A | Discharge status: Home / Self Care | Payer: Medicare Other | Attending: Emergency Medicine | Admitting: Emergency Medicine

## 2011-03-22 DIAGNOSIS — R05 Cough: Secondary | ICD-10-CM | POA: Insufficient documentation

## 2011-03-22 DIAGNOSIS — Z79899 Other long term (current) drug therapy: Secondary | ICD-10-CM | POA: Insufficient documentation

## 2011-03-22 DIAGNOSIS — G4733 Obstructive sleep apnea (adult) (pediatric): Secondary | ICD-10-CM | POA: Insufficient documentation

## 2011-03-22 DIAGNOSIS — I10 Essential (primary) hypertension: Secondary | ICD-10-CM | POA: Diagnosis not present

## 2011-03-22 DIAGNOSIS — R079 Chest pain, unspecified: Secondary | ICD-10-CM | POA: Insufficient documentation

## 2011-03-22 DIAGNOSIS — J45909 Unspecified asthma, uncomplicated: Secondary | ICD-10-CM | POA: Diagnosis not present

## 2011-03-22 DIAGNOSIS — R112 Nausea with vomiting, unspecified: Secondary | ICD-10-CM | POA: Insufficient documentation

## 2011-03-22 DIAGNOSIS — J111 Influenza due to unidentified influenza virus with other respiratory manifestations: Secondary | ICD-10-CM | POA: Diagnosis not present

## 2011-03-22 DIAGNOSIS — R0602 Shortness of breath: Secondary | ICD-10-CM | POA: Insufficient documentation

## 2011-03-22 DIAGNOSIS — Z853 Personal history of malignant neoplasm of breast: Secondary | ICD-10-CM | POA: Insufficient documentation

## 2011-03-22 DIAGNOSIS — R059 Cough, unspecified: Secondary | ICD-10-CM | POA: Insufficient documentation

## 2011-03-22 DIAGNOSIS — R6883 Chills (without fever): Secondary | ICD-10-CM | POA: Insufficient documentation

## 2011-03-22 DIAGNOSIS — C50919 Malignant neoplasm of unspecified site of unspecified female breast: Secondary | ICD-10-CM | POA: Diagnosis not present

## 2011-03-22 DIAGNOSIS — IMO0001 Reserved for inherently not codable concepts without codable children: Secondary | ICD-10-CM | POA: Insufficient documentation

## 2011-03-22 DIAGNOSIS — M129 Arthropathy, unspecified: Secondary | ICD-10-CM | POA: Insufficient documentation

## 2011-03-22 DIAGNOSIS — R5381 Other malaise: Secondary | ICD-10-CM | POA: Insufficient documentation

## 2011-03-22 DIAGNOSIS — E785 Hyperlipidemia, unspecified: Secondary | ICD-10-CM | POA: Insufficient documentation

## 2011-03-22 DIAGNOSIS — R350 Frequency of micturition: Secondary | ICD-10-CM | POA: Insufficient documentation

## 2011-03-22 DIAGNOSIS — M1990 Unspecified osteoarthritis, unspecified site: Secondary | ICD-10-CM | POA: Diagnosis not present

## 2011-03-22 LAB — URINALYSIS, ROUTINE W REFLEX MICROSCOPIC
Glucose, UA: NEGATIVE mg/dL
Hgb urine dipstick: NEGATIVE
Leukocytes, UA: NEGATIVE
Protein, ur: NEGATIVE mg/dL
Specific Gravity, Urine: 1.027 (ref 1.005–1.030)
Urobilinogen, UA: 4 mg/dL — ABNORMAL HIGH (ref 0.0–1.0)

## 2011-03-22 MED ORDER — ALBUTEROL SULFATE (5 MG/ML) 0.5% IN NEBU
INHALATION_SOLUTION | RESPIRATORY_TRACT | Status: AC
  Administered 2011-03-22: 17:00:00
  Filled 2011-03-22: qty 1

## 2011-03-22 MED ORDER — CEPHALEXIN 500 MG PO CAPS: 500.0000 mg | ORAL_CAPSULE | Freq: Three times a day (TID) | ORAL | Status: AC

## 2011-03-22 MED ORDER — IPRATROPIUM BROMIDE 0.02 % IN SOLN
RESPIRATORY_TRACT | Status: AC
  Administered 2011-03-22: 17:00:00
  Filled 2011-03-22: qty 2.5

## 2011-03-22 MED ORDER — ALBUTEROL SULFATE (5 MG/ML) 0.5% IN NEBU
5.0000 mg | INHALATION_SOLUTION | Freq: Once | RESPIRATORY_TRACT | Status: AC
  Administered 2011-03-22: 5 mg via RESPIRATORY_TRACT
  Filled 2011-03-22: qty 1

## 2011-03-22 NOTE — ED Provider Notes (Signed)
History     CSN: [id number]  Arrival date & time 03/22/11  1614   First MD Initiated Contact with Patient 03/22/11 2004      Chief Complaint  Patient presents with  . Shortness of Breath  . Asthma     HPI  History provided by the patient. Patient is a 50 year old female with past history of asthma, hypertension, hyperlipidemia, left breast cancer status post mastectomy currently in remission who presents with multiple complaints of persistent cough for the past several weeks and increased urinary frequency. Patient reports that she was admitted to the [facility] long hospital around December 12 and treated for the flu. She states that at that time she had a Foley catheter placed. She also states that she was diagnosed with a urinary tract infection recently and was given a prescription for antibiotics but she has not been able to fill this prescription yet. Patient also states that she has continued to have cough for the past 2 weeks since leaving the hospital. At times coughing fits cause patient to have nausea and vomiting. She also becomes very short of breath with prolonged coughing. Patient reports having some associated chills but denies fever sweats.    Past Medical History  Diagnosis Date  . Uterine fibroid   . Hyperlipidemia   . Hypertension   . Asthma   . OSA (obstructive sleep apnea)   . Arthritis   . Breast cancer   . Prolapsed uterus     Past Surgical History  Procedure Date  . Eye surgery 1961    right  . Breast surgery   . Mastectomy     Family History  Problem Relation Age of Onset  . Breast cancer Mother   . Colon cancer Mother   . Hypotension Mother   . Asthma Mother   . Diabetes type II Mother   . Arthritis Mother   . Mental illness Brother   . Heart disease Brother   . Cerebral palsy Daughter   . Emphysema Brother     never smoker    History  Substance Use Topics  . Smoking status: Former Smoker -- 0.3 packs/day for 10 years    Types:  Cigarettes    Quit date: 03/26/1988  . Smokeless tobacco: Never Used  . Alcohol Use: No    OB History    Grav Para Term Preterm Abortions TAB SAB Ect Mult Living                  Review of Systems  Constitutional: Positive for chills and fatigue. Negative for fever and appetite change.  HENT: Negative for congestion and rhinorrhea.   Respiratory: Positive for cough and shortness of breath.   Cardiovascular: Positive for chest pain.  Gastrointestinal: Positive for nausea and vomiting. Negative for abdominal pain, diarrhea and constipation.  Genitourinary: Positive for frequency. Negative for dysuria and hematuria.  Musculoskeletal: Positive for myalgias.  All other systems reviewed and are negative.    Allergies  Tums  Home Medications   Current Outpatient Rx  Name Route Sig Dispense Refill  . ALBUTEROL SULFATE HFA 108 (90 BASE) MCG/ACT IN AERS Inhalation Inhale 2 puffs into the lungs every 6 (six) hours as needed for wheezing. 1 Inhaler 2  . ANASTROZOLE 1 MG PO TABS Oral Take 1 mg by mouth daily.     . CEPHALEXIN 500 MG PO CAPS Oral Take 1 capsule (500 mg total) by mouth 3 (three) times daily. 30 capsule 0  . CYCLOBENZAPRINE HCL  5 MG PO TABS Oral Take 5 mg by mouth 3 (three) times daily as needed. For muscle spasms     . DILTIAZEM HCL 60 MG PO TABS Oral Take 60 mg by mouth 2 (two) times daily.     Marland Kitchen FLUTICASONE PROPIONATE  HFA 44 MCG/ACT IN AERO Inhalation Inhale 2 puffs into the lungs 2 (two) times daily.      . FUROSEMIDE 40 MG PO TABS Oral Take 40 mg by mouth daily as needed. For fluid retention    . HYDROCODONE-ACETAMINOPHEN 5-325 MG PO TABS  One to two tablets every 4-6 hours prn pain 12 tablet 0  . IPRATROPIUM-ALBUTEROL 0.5-2.5 (3) MG/3ML IN SOLN Nebulization Take 3 mLs by nebulization every 4 (four) hours as needed.      Marland Kitchen LORATADINE 10 MG PO TABS Oral Take 10 mg by mouth daily.     Marland Kitchen MONTELUKAST SODIUM 10 MG PO TABS Oral Take 10 mg by mouth at bedtime.     Marland Kitchen  SIMVASTATIN 20 MG PO TABS Oral Take 20 mg by mouth at bedtime.     . TRAMADOL HCL 50 MG PO TABS Oral Take 50-100 mg by mouth every 6 (six) hours as needed. For pain/1 to 2 every 8 hours as needed    . VITAMIN D (ERGOCALCIFEROL) 50000 UNITS PO CAPS Oral Take 50,000 Units by mouth every 7 (seven) days. Patient states that she takes once a week for 6 weeks. Could not get filled because she had no money.    Marland Kitchen PHENAZOPYRIDINE HCL 200 MG PO TABS Oral Take 1 tablet (200 mg total) by mouth 3 (three) times daily. 6 tablet 0    BP 151/101  Pulse 100  Temp(Src) 98.1 F (36.7 C) (Oral)  Resp 22  SpO2 98%  Physical Exam  Nursing note and vitals reviewed. Constitutional: She is oriented to person, place, and time. She appears well-developed and well-nourished. No distress.  HENT:  Head: Normocephalic.  Mouth/Throat: Oropharynx is clear and moist.  Neck: Normal range of motion.  Cardiovascular: Normal rate and regular rhythm.   No murmur heard. Pulmonary/Chest: Effort normal and breath sounds normal. No respiratory distress. She has no wheezes. She has no rales.  Abdominal: Soft. There is no tenderness. There is no rebound, no guarding, no CVA tenderness, no tenderness at McBurney's point and negative Murphy's sign.  Musculoskeletal: She exhibits no edema and no tenderness.  Neurological: She is alert and oriented to person, place, and time.  Skin: Skin is warm and dry. No rash noted.  Psychiatric: She has a normal mood and affect. Her behavior is normal.    ED Course  Procedures (including critical care time)   Labs Reviewed  URINALYSIS, ROUTINE W REFLEX MICROSCOPIC   Results for orders placed during the hospital encounter of 03/22/11  URINALYSIS, ROUTINE W REFLEX MICROSCOPIC      Component Value Range   Color, Urine AMBER (*) YELLOW    APPearance CLOUDY (*) CLEAR    Specific Gravity, Urine 1.027  1.005 - 1.030    pH 6.0  5.0 - 8.0    Glucose, UA NEGATIVE  NEGATIVE (mg/dL)   Hgb urine  dipstick NEGATIVE  NEGATIVE    Bilirubin Urine SMALL (*) NEGATIVE    Ketones, ur 15 (*) NEGATIVE (mg/dL)   Protein, ur NEGATIVE  NEGATIVE (mg/dL)   Urobilinogen, UA 4.0 (*) 0.0 - 1.0 (mg/dL)   Nitrite NEGATIVE  NEGATIVE    Leukocytes, UA NEGATIVE  NEGATIVE  Dg Chest 2 View  03/22/2011  *RADIOLOGY REPORT*  Clinical Data: Shortness of breath.  Chest tightness  CHEST - 2 VIEW  Comparison: 03/04/2011  Findings: Heart size is normal.  No pleural effusion or pulmonary edema.  Interstitial coarsening is noted.  No airspace consolidation.  Prior left axillary lymph node dissection.  There is a tissue expander  identified within the left breast.  IMPRESSION:  1.  No acute cardiopulmonary abnormalities.  Original Report Authenticated By: Rosealee Albee, M.D.     1. Asthma       MDM  8:20 PM patient seen and evaluated. Patient in no acute distress.        Angus Seller, [place] 03/23/11 [phone]

## 2011-03-22 NOTE — ED Notes (Signed)
Pt reports hx of sob and asthma. States last couple days sob has been worsened. Breathing tx started per protocol. Pt reports breathing tx is making her nauseas. Pt reports cough. Pt reports she has admitted in the hospital for flu.

## 2011-03-22 NOTE — ED Notes (Signed)
Respiratory therapy notified about shortness of breath and cough and need for possible protocols to be initiated while pt is waiting to be triaged.

## 2011-03-23 DIAGNOSIS — C50919 Malignant neoplasm of unspecified site of unspecified female breast: Secondary | ICD-10-CM | POA: Diagnosis not present

## 2011-03-23 DIAGNOSIS — I10 Essential (primary) hypertension: Secondary | ICD-10-CM | POA: Diagnosis not present

## 2011-03-23 DIAGNOSIS — M1990 Unspecified osteoarthritis, unspecified site: Secondary | ICD-10-CM | POA: Diagnosis not present

## 2011-03-23 DIAGNOSIS — J111 Influenza due to unidentified influenza virus with other respiratory manifestations: Secondary | ICD-10-CM | POA: Diagnosis not present

## 2011-03-23 DIAGNOSIS — J45909 Unspecified asthma, uncomplicated: Secondary | ICD-10-CM | POA: Diagnosis not present

## 2011-03-23 DIAGNOSIS — G4733 Obstructive sleep apnea (adult) (pediatric): Secondary | ICD-10-CM | POA: Diagnosis not present

## 2011-03-26 NOTE — ED Provider Notes (Signed)
Medical screening examination/treatment/procedure(s) were performed by non-physician practitioner and as supervising physician I was immediately available for consultation/collaboration.  Nataniel Gasper L Thelma Lorenzetti, MD 03/26/11 1644 

## 2011-03-28 DIAGNOSIS — M1990 Unspecified osteoarthritis, unspecified site: Secondary | ICD-10-CM | POA: Diagnosis not present

## 2011-03-28 DIAGNOSIS — G4733 Obstructive sleep apnea (adult) (pediatric): Secondary | ICD-10-CM | POA: Diagnosis not present

## 2011-03-28 DIAGNOSIS — J111 Influenza due to unidentified influenza virus with other respiratory manifestations: Secondary | ICD-10-CM | POA: Diagnosis not present

## 2011-03-28 DIAGNOSIS — I10 Essential (primary) hypertension: Secondary | ICD-10-CM | POA: Diagnosis not present

## 2011-03-28 DIAGNOSIS — C50919 Malignant neoplasm of unspecified site of unspecified female breast: Secondary | ICD-10-CM | POA: Diagnosis not present

## 2011-03-28 DIAGNOSIS — J45909 Unspecified asthma, uncomplicated: Secondary | ICD-10-CM | POA: Diagnosis not present

## 2011-03-29 ENCOUNTER — Telehealth: Payer: Self-pay | Admitting: *Deleted

## 2011-03-29 DIAGNOSIS — G4730 Sleep apnea, unspecified: Secondary | ICD-10-CM | POA: Diagnosis not present

## 2011-03-29 DIAGNOSIS — I10 Essential (primary) hypertension: Secondary | ICD-10-CM | POA: Diagnosis not present

## 2011-03-29 DIAGNOSIS — E785 Hyperlipidemia, unspecified: Secondary | ICD-10-CM | POA: Diagnosis not present

## 2011-03-29 DIAGNOSIS — J45909 Unspecified asthma, uncomplicated: Secondary | ICD-10-CM | POA: Diagnosis not present

## 2011-03-29 NOTE — Telephone Encounter (Signed)
Pt called to request an appt ASAP.  Per inquiry Jodee states she has been to the ER and is on an ABX for UTI but is having current issues of " abd pain, can feel that cyst on my left side and my uterus feels like it is going to fall on the floor ". 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  This RN noted per MD dictation need for pt to be seen by GYN for above issues per last office visit.  This RN obtianed an appt with Dr Lang Snow at [facility] for January 10th at 11am.  Informed pt of above with inquiry as well per her issues and concerns until then. Lynnley states she is currently at her primary MD.  This RN informed her tell MD of above Gyn appt.  No other needs at this time.

## 2011-03-30 ENCOUNTER — Ambulatory Visit: Payer: Medicare Other | Admitting: Physical Therapy

## 2011-04-03 ENCOUNTER — Ambulatory Visit (INDEPENDENT_AMBULATORY_CARE_PROVIDER_SITE_OTHER): Payer: Medicare Other | Admitting: Pulmonary Disease

## 2011-04-03 ENCOUNTER — Encounter: Payer: Self-pay | Admitting: Pulmonary Disease

## 2011-04-03 VITALS — BP 130/78 | HR 86 | Temp 98.4°F | Ht 67.0 in | Wt 275.4 lb

## 2011-04-03 DIAGNOSIS — G4733 Obstructive sleep apnea (adult) (pediatric): Secondary | ICD-10-CM

## 2011-04-03 NOTE — Assessment & Plan Note (Signed)
Pt has never been set up on cpap.  Will arrange for this, and to see me in 6 weeks.

## 2011-04-03 NOTE — Progress Notes (Signed)
  Subjective:    Patient ID: Michaela Little, female    DOB: 05-03-1960, 51 y.o.   MRN: [id number]  HPI No visit.  Pt never setup on cpap   Review of Systems  Constitutional: Positive for diaphoresis. Negative for fever and unexpected weight change.  HENT: Positive for congestion and sinus pressure. Negative for ear pain, nosebleeds, sore throat, rhinorrhea, sneezing, trouble swallowing, dental problem and postnasal drip.   Eyes: Negative for redness and itching.  Respiratory: Positive for cough, chest tightness and shortness of breath. Negative for wheezing.   Cardiovascular: Positive for leg swelling. Negative for palpitations.  Gastrointestinal: Positive for nausea and vomiting.  Genitourinary: Negative for dysuria.  Musculoskeletal: Negative for joint swelling.  Skin: Negative for rash.  Neurological: Negative for headaches.  Hematological: Does not bruise/bleed easily.  Psychiatric/Behavioral: Negative for dysphoric mood. The patient is not nervous/anxious.        Objective:   Physical Exam        Assessment & Plan:

## 2011-04-03 NOTE — Patient Instructions (Signed)
Will make sure your cpap is going to be set up I need to see you back in 6 weeks.

## 2011-04-04 DIAGNOSIS — J45909 Unspecified asthma, uncomplicated: Secondary | ICD-10-CM | POA: Diagnosis not present

## 2011-04-04 DIAGNOSIS — I10 Essential (primary) hypertension: Secondary | ICD-10-CM | POA: Diagnosis not present

## 2011-04-04 DIAGNOSIS — M1990 Unspecified osteoarthritis, unspecified site: Secondary | ICD-10-CM | POA: Diagnosis not present

## 2011-04-04 DIAGNOSIS — G4733 Obstructive sleep apnea (adult) (pediatric): Secondary | ICD-10-CM | POA: Diagnosis not present

## 2011-04-04 DIAGNOSIS — J111 Influenza due to unidentified influenza virus with other respiratory manifestations: Secondary | ICD-10-CM | POA: Diagnosis not present

## 2011-04-04 DIAGNOSIS — C50919 Malignant neoplasm of unspecified site of unspecified female breast: Secondary | ICD-10-CM | POA: Diagnosis not present

## 2011-04-05 DIAGNOSIS — Z124 Encounter for screening for malignant neoplasm of cervix: Secondary | ICD-10-CM | POA: Diagnosis not present

## 2011-04-05 DIAGNOSIS — N879 Dysplasia of cervix uteri, unspecified: Secondary | ICD-10-CM | POA: Diagnosis not present

## 2011-05-02 ENCOUNTER — Emergency Department (HOSPITAL_COMMUNITY): Payer: Medicare Other

## 2011-05-02 ENCOUNTER — Emergency Department (HOSPITAL_COMMUNITY)
Admission: EM | Admit: 2011-05-02 | Discharge: 2011-05-02 | Disposition: A | Discharge status: Home / Self Care | Payer: Medicare Other | Attending: Emergency Medicine | Admitting: Emergency Medicine

## 2011-05-02 ENCOUNTER — Encounter (HOSPITAL_COMMUNITY): Payer: Self-pay | Admitting: *Deleted

## 2011-05-02 DIAGNOSIS — I10 Essential (primary) hypertension: Secondary | ICD-10-CM | POA: Insufficient documentation

## 2011-05-02 DIAGNOSIS — M519 Unspecified thoracic, thoracolumbar and lumbosacral intervertebral disc disorder: Secondary | ICD-10-CM | POA: Diagnosis not present

## 2011-05-02 DIAGNOSIS — G4733 Obstructive sleep apnea (adult) (pediatric): Secondary | ICD-10-CM | POA: Diagnosis not present

## 2011-05-02 DIAGNOSIS — R079 Chest pain, unspecified: Secondary | ICD-10-CM | POA: Diagnosis not present

## 2011-05-02 DIAGNOSIS — R350 Frequency of micturition: Secondary | ICD-10-CM | POA: Insufficient documentation

## 2011-05-02 DIAGNOSIS — IMO0002 Reserved for concepts with insufficient information to code with codable children: Secondary | ICD-10-CM | POA: Diagnosis not present

## 2011-05-02 DIAGNOSIS — T148XXA Other injury of unspecified body region, initial encounter: Secondary | ICD-10-CM

## 2011-05-02 DIAGNOSIS — X58XXXA Exposure to other specified factors, initial encounter: Secondary | ICD-10-CM | POA: Insufficient documentation

## 2011-05-02 DIAGNOSIS — J45909 Unspecified asthma, uncomplicated: Secondary | ICD-10-CM | POA: Diagnosis not present

## 2011-05-02 DIAGNOSIS — M549 Dorsalgia, unspecified: Secondary | ICD-10-CM | POA: Diagnosis not present

## 2011-05-02 DIAGNOSIS — R0789 Other chest pain: Secondary | ICD-10-CM | POA: Diagnosis not present

## 2011-05-02 DIAGNOSIS — M545 Low back pain: Secondary | ICD-10-CM | POA: Diagnosis not present

## 2011-05-02 DIAGNOSIS — E785 Hyperlipidemia, unspecified: Secondary | ICD-10-CM | POA: Diagnosis not present

## 2011-05-02 DIAGNOSIS — M5137 Other intervertebral disc degeneration, lumbosacral region: Secondary | ICD-10-CM | POA: Diagnosis not present

## 2011-05-02 DIAGNOSIS — M47814 Spondylosis without myelopathy or radiculopathy, thoracic region: Secondary | ICD-10-CM | POA: Diagnosis not present

## 2011-05-02 LAB — URINE MICROSCOPIC-ADD ON

## 2011-05-02 LAB — URINALYSIS, ROUTINE W REFLEX MICROSCOPIC
Glucose, UA: NEGATIVE mg/dL
Ketones, ur: NEGATIVE mg/dL
Nitrite: NEGATIVE
Protein, ur: NEGATIVE mg/dL

## 2011-05-02 MED ORDER — OXYCODONE-ACETAMINOPHEN 5-325 MG PO TABS
2.0000 | ORAL_TABLET | Freq: Once | ORAL | Status: AC
  Administered 2011-05-02: 2 via ORAL
  Filled 2011-05-02: qty 2

## 2011-05-02 MED ORDER — DIAZEPAM 5 MG PO TABS: 5.0000 mg | ORAL_TABLET | Freq: Two times a day (BID) | ORAL | Status: AC

## 2011-05-02 MED ORDER — HYDROCODONE-ACETAMINOPHEN 5-325 MG PO TABS: 2.0000 | ORAL_TABLET | ORAL | Status: AC | PRN

## 2011-05-02 MED ORDER — ONDANSETRON 4 MG PO TBDP
8.0000 mg | ORAL_TABLET | Freq: Once | ORAL | Status: AC
  Administered 2011-05-02: 8 mg via ORAL
  Filled 2011-05-02: qty 2

## 2011-05-02 NOTE — ED Notes (Signed)
Pt is here for evaluation of right lower back pain.  Pt has had urgency with this.  Not associated with any trauma

## 2011-05-02 NOTE — ED Notes (Signed)
Right flank pain with urinary frequency 

## 2011-05-02 NOTE — ED Notes (Signed)
Pt reports pain improved 5/10.  Provided with crackers and gingerale for reported nausea r/t percocet.

## 2011-05-02 NOTE — ED Notes (Signed)
Pt complaining of chest pain.

## 2011-05-02 NOTE — ED Notes (Signed)
Pt states that she has been having aching back pain over the past 2 days. Pt denies falling. Pt states that pain becomes worse with eating and exertion. Pt states that area where pain is located in hard. Palpated area, no hardness felt, no bruising noted. Pt states that pain worsens when taking deep breaths.

## 2011-05-02 NOTE — ED Provider Notes (Signed)
History     CSN: [id number]  Arrival date & time 05/02/11  1126   First MD Initiated Contact with Patient 05/02/11 1446      Chief Complaint  Patient presents with  . Back Pain    (Consider location/radiation/quality/duration/timing/severity/associated sxs/prior treatment) Patient is a 51 y.o. female presenting with back pain. The history is provided by the patient.  Back Pain    patient here with right-sided back pain which has been chronic in nature. Patient has a urinary frequency, no fever or dysuria. History of similar symptoms in the past has not been treated for this. Denies any lower extremity numbness or weakness. Pain is worse with movement or twisting of her torso made better with nothing No syncope associated with this. No recent history of trauma. Patient states that she was diagnosed with some type of spinal condition back in [place] but is unsure of what it was. She does have a history of breast cancer which was last treated in 2010  Patient did have a one second episode of chest discomfort which has since resolved. There was no associated shortness of breath, diaphoresis, dizziness. Patient had similar symptoms in the past  Past Medical History  Diagnosis Date  . Uterine fibroid   . Hyperlipidemia   . Hypertension   . Asthma   . OSA (obstructive sleep apnea)   . Arthritis   . Breast cancer   . Prolapsed uterus     Past Surgical History  Procedure Date  . Eye surgery 1961    right  . Breast surgery   . Mastectomy     Family History  Problem Relation Age of Onset  . Breast cancer Mother   . Colon cancer Mother   . Hypotension Mother   . Asthma Mother   . Diabetes type II Mother   . Arthritis Mother   . Mental illness Brother   . Heart disease Brother   . Cerebral palsy Daughter   . Emphysema Brother     never smoker    History  Substance Use Topics  . Smoking status: Former Smoker -- 0.3 packs/day for 10 years    Types: Cigarettes    Quit  date: 03/26/1988  . Smokeless tobacco: Never Used  . Alcohol Use: No    OB History    Grav Para Term Preterm Abortions TAB SAB Ect Mult Living                  Review of Systems  Musculoskeletal: Positive for back pain.  All other systems reviewed and are negative.    Allergies  Tums  Home Medications   Current Outpatient Rx  Name Route Sig Dispense Refill  . ALBUTEROL SULFATE HFA 108 (90 BASE) MCG/ACT IN AERS Inhalation Inhale 2 puffs into the lungs every 4 (four) hours as needed. Shortness of breath    . ANASTROZOLE 1 MG PO TABS Oral Take 1 mg by mouth daily.     Marland Kitchen CALCIUM 600/VITAMIN D PO Oral Take 1 capsule by mouth 3 (three) times daily.     . CYCLOBENZAPRINE HCL 5 MG PO TABS Oral Take 5 mg by mouth 3 (three) times daily as needed. For muscle spasms     . DILTIAZEM HCL 60 MG PO TABS Oral Take 60 mg by mouth 2 (two) times daily.     Marland Kitchen FLUTICASONE PROPIONATE  HFA 44 MCG/ACT IN AERO Inhalation Inhale 2 puffs into the lungs 2 (two) times daily.     Marland Kitchen  FUROSEMIDE 40 MG PO TABS Oral Take 40 mg by mouth daily as needed. For fluid retention    . HYDROCODONE-ACETAMINOPHEN 5-325 MG PO TABS Oral Take 1-2 tablets by mouth every 4 (four) hours as needed. For pain    . IPRATROPIUM-ALBUTEROL 0.5-2.5 (3) MG/3ML IN SOLN Nebulization Take 3 mLs by nebulization every 4 (four) hours as needed. Shortness of breath    . LORATADINE 10 MG PO TABS Oral Take 10 mg by mouth daily.     Marland Kitchen MONTELUKAST SODIUM 10 MG PO TABS Oral Take 10 mg by mouth at bedtime.     Marland Kitchen SIMVASTATIN 20 MG PO TABS Oral Take 20 mg by mouth at bedtime.     . TRAMADOL HCL 50 MG PO TABS Oral Take 50-100 mg by mouth every 8 (eight) hours as needed. For pain      BP 164/95  Pulse 71  Temp(Src) 97.7 F (36.5 C) (Oral)  Resp 18  SpO2 98%  Physical Exam  Nursing note and vitals reviewed. Constitutional: She is oriented to person, place, and time. She appears well-developed and well-nourished.  Non-toxic appearance. No  distress.  HENT:  Head: Normocephalic and atraumatic.  Eyes: Conjunctivae, EOM and lids are normal. Pupils are equal, round, and reactive to light.  Neck: Normal range of motion. Neck supple. No tracheal deviation present. No mass present.  Cardiovascular: Normal rate, regular rhythm and normal heart sounds.  Exam reveals no gallop.   No murmur heard. Pulmonary/Chest: Effort normal and breath sounds normal. No stridor. No respiratory distress. She has no decreased breath sounds. She has no wheezes. She has no rhonchi. She has no rales.  Abdominal: Soft. Normal appearance and bowel sounds are normal. She exhibits no distension. There is no tenderness. There is no rebound and no CVA tenderness.  Musculoskeletal: Normal range of motion. She exhibits no edema and no tenderness.       Lumbar back: She exhibits tenderness.       Back:  Neurological: She is alert and oriented to person, place, and time. She has normal strength. No cranial nerve deficit or sensory deficit. GCS eye subscore is 4. GCS verbal subscore is 5. GCS motor subscore is 6.  Skin: Skin is warm and dry. No abrasion and no rash noted.  Psychiatric: She has a normal mood and affect. Her speech is normal and behavior is normal.    ED Course  Procedures (including critical care time)  Labs Reviewed  URINALYSIS, ROUTINE W REFLEX MICROSCOPIC - Abnormal; Notable for the following:    APPearance CLOUDY (*)    Leukocytes, UA SMALL (*)    All other components within normal limits  URINE MICROSCOPIC-ADD ON   No results found.   No diagnosis found.    MDM  With patient at length about her current condition. She has seen her primary care Dr. for similar symptoms and no diagnosis has been made. Some pain relief with the Percocet. Patient will be given medication for muscle spasm and she will followup with her primary care Dr. tomorrow. Patient does not appear to be any acute respiratory distress at this time and denies chest  pain   Date: 05/02/2011  Rate: 69  Rhythm: normal sinus rhythm  QRS Axis: normal  Intervals: normal  ST/T Wave abnormalities: normal  Conduction Disutrbances:none  Narrative Interpretation:   Old EKG Reviewed: none available          Toy Baker, MD 05/02/11 [phone]

## 2011-05-03 NOTE — ED Provider Notes (Signed)
Medical screening examination/treatment/procedure(s) were performed by non-physician practitioner and as supervising physician I was immediately available for consultation/collaboration.  Cyndra Numbers, MD 05/03/11 [phone]

## 2011-05-15 ENCOUNTER — Ambulatory Visit: Payer: Medicare Other | Admitting: Pulmonary Disease

## 2011-05-28 ENCOUNTER — Other Ambulatory Visit: Payer: Self-pay | Admitting: Physician Assistant

## 2011-05-28 DIAGNOSIS — Z853 Personal history of malignant neoplasm of breast: Secondary | ICD-10-CM

## 2011-05-29 ENCOUNTER — Ambulatory Visit: Payer: Medicare Other | Admitting: Physician Assistant

## 2011-05-29 ENCOUNTER — Other Ambulatory Visit: Payer: Medicare Other | Admitting: Lab

## 2011-05-29 NOTE — Progress Notes (Signed)
[facility] today.  Letter mailed to patient.  

## 2011-06-06 ENCOUNTER — Ambulatory Visit: Payer: Medicare Other | Admitting: Pulmonary Disease

## 2011-06-27 ENCOUNTER — Ambulatory Visit (HOSPITAL_BASED_OUTPATIENT_CLINIC_OR_DEPARTMENT_OTHER): Payer: Medicare Other | Admitting: Physician Assistant

## 2011-06-27 ENCOUNTER — Other Ambulatory Visit (HOSPITAL_BASED_OUTPATIENT_CLINIC_OR_DEPARTMENT_OTHER): Payer: Medicare Other | Admitting: Lab

## 2011-06-27 ENCOUNTER — Encounter: Payer: Self-pay | Admitting: Physician Assistant

## 2011-06-27 ENCOUNTER — Telehealth: Payer: Self-pay | Admitting: Oncology

## 2011-06-27 VITALS — BP 129/78 | HR 78 | Temp 98.3°F | Ht 67.0 in | Wt 271.1 lb

## 2011-06-27 DIAGNOSIS — C50912 Malignant neoplasm of unspecified site of left female breast: Secondary | ICD-10-CM

## 2011-06-27 DIAGNOSIS — R21 Rash and other nonspecific skin eruption: Secondary | ICD-10-CM

## 2011-06-27 DIAGNOSIS — Z853 Personal history of malignant neoplasm of breast: Secondary | ICD-10-CM

## 2011-06-27 DIAGNOSIS — N6459 Other signs and symptoms in breast: Secondary | ICD-10-CM

## 2011-06-27 DIAGNOSIS — C50919 Malignant neoplasm of unspecified site of unspecified female breast: Secondary | ICD-10-CM

## 2011-06-27 DIAGNOSIS — N6452 Nipple discharge: Secondary | ICD-10-CM

## 2011-06-27 LAB — CBC WITH DIFFERENTIAL/PLATELET
Basophils Absolute: 0 10*3/uL (ref 0.0–0.1)
EOS%: 4.3 % (ref 0.0–7.0)
Eosinophils Absolute: 0.2 10*3/uL (ref 0.0–0.5)
HCT: 40.3 % (ref 34.8–46.6)
HGB: 13.1 g/dL (ref 11.6–15.9)
LYMPH%: 27.9 % (ref 14.0–49.7)
MCH: 26.8 pg (ref 25.1–34.0)
MCV: 82.1 fL (ref 79.5–101.0)
MONO%: 7.4 % (ref 0.0–14.0)
NEUT#: 2.6 10*3/uL (ref 1.5–6.5)
NEUT%: 59.5 % (ref 38.4–76.8)
Platelets: 279 10*3/uL (ref 145–400)

## 2011-06-27 LAB — COMPREHENSIVE METABOLIC PANEL
Alkaline Phosphatase: 104 U/L (ref 39–117)
BUN: 13 mg/dL (ref 6–23)
CO2: 25 mEq/L (ref 19–32)
Creatinine, Ser: 0.75 mg/dL (ref 0.50–1.10)
Glucose, Bld: 150 mg/dL — ABNORMAL HIGH (ref 70–99)
Sodium: 138 mEq/L (ref 135–145)
Total Bilirubin: 0.7 mg/dL (ref 0.3–1.2)
Total Protein: 7 g/dL (ref 6.0–8.3)

## 2011-06-27 LAB — CANCER ANTIGEN 27.29: CA 27.29: 13 U/mL (ref 0–39)

## 2011-06-27 MED ORDER — ANASTROZOLE 1 MG PO TABS: 1.0000 mg | ORAL_TABLET | Freq: Every day | ORAL | Status: DC

## 2011-06-27 NOTE — Progress Notes (Signed)
ID: Michaela Little   DOB: 05-23-1960  MR#: [id number]  WUJ#:[id number]  HISTORY OF PRESENT ILLNESS: Michaela Little is a [place] woman who recently moved to [place]  with a history of breast cancer, and establishied herself with our practice.  The patient had left breast and left axillary lymph node biopsy performed March of 2010, both positive (I do not have the pathology report at this point). She was treated neo-adjuvantly at [facility] with (a) paclitaxel weekly x12 and (b) doxorubicin/cyclophosphamide in dose dense fashion x4, both given with tifiparnib.   She then proceeded to left modified radical mastectomy 12/23/2008, the final pathology showing a 4 cm residual invasive lobular carcinoma, involving 5/22 lymph nodes sampled. The tumor was estrogen and progesterone receptor positive, HER-2 negative.   Postoperatively she received 50 gray of radiation completed January of 2011, including the left supraclavicular lymph node basin. She was then started on tamoxifen, but developed some uterine lining thickening and was switched to anastrozole in approximately May 2012.  INTERVAL HISTORY: Michaela Little returns today for followup of her left breast carcinoma. She continues on anastrozole, although she admits she "ran out" and has not had a refill in 3 or 4 weeks. She has no problems with tolerance, and is ready to have the medication refilled. The patient had multiple concerns to discuss today, and over half of our 45 minute appointment was spent today reviewing those concerns, and coordinating her care.  REVIEW OF SYSTEMS: Michaela Little was sick 2 weeks ago with fevers as high as 101, body aches, and a cough. This lasted for 7-10 days and it really sounds like she had the flu. She's had no fevers now for at least 4 or 5 days and is feeling much better today. She still has a slight residual cough, but this is improved significantly, and she has no significant phlegm production. She has shortness of breath with  exertion, but this is not new, and has not worsened.  Michaela Little has occasional hot flashes. She has a history of arthritis, especially in the lower extremities, but has noticed no increased since being on the anastrozole. No significant vaginal dryness, no vaginal bleeding.  Michaela Little still has her tissue expander in place on the left side. Of course her mastectomy and reconstruction was done in [place] prior to moving to [place], and she has not established herself with a Engineer, petroleum here locally. I will note that she also experienced some nipple discharge in the opposite breast last week, clear with no evidence of blood. This was an unusual occurrence for her, and she is extremely concerned about this.  Michaela Little has had a very fine palpable rash diffusely, primarily from the waist up. She tells me her skin is very itchy. This is intermittent, sometimes worse than others, and she cannot think of anything she has changed and her diet, or her day-to-day personal care (for instance soap, detergent, Etc.). She would like a dermatology referral for further evaluation.  Otherwise a detailed review of systems is noncontributory.  PAST MEDICAL HISTORY: Past Medical History  Diagnosis Date  . Uterine fibroid   . Hyperlipidemia   . Hypertension   . Asthma   . OSA (obstructive sleep apnea)   . Arthritis   . Breast cancer   . Prolapsed uterus     PAST SURGICAL HISTORY: Past Surgical History  Procedure Date  . Eye surgery 1961    right  . Breast surgery   . Mastectomy     FAMILY HISTORY Family History  Problem Relation Age of Onset  . Breast cancer Mother   . Colon cancer Mother   . Hypotension Mother   . Asthma Mother   . Diabetes type II Mother   . Arthritis Mother   . Mental illness Brother   . Heart disease Brother   . Cerebral palsy Daughter   . Emphysema Brother     never smoker   Gynecologic history: She is GX P5. At first pregnancy to term age 74. Menarche age 59. Last  menstrual period about 2 years ago, when she had her chemotherapy.   Social History: She is disabled secondary to arthritis, she tells me. She moved from [place] to [place] for "peace of mind". Of her 5 children only one daughter a living in [place], [place]. The patient lives alone. She mostly reads and does computer games on her phone during the day. She tells me she is starting at a stretching program and would eventually like to start an exercise program. She has 20 grandchildren.    ADVANCED DIRECTIVES: She does not have a healthcare power of attorney.    HEALTH MAINTENANCE: History  Substance Use Topics  . Smoking status: Former Smoker -- 0.3 packs/day for 10 years    Types: Cigarettes    Quit date: 03/26/1988  . Smokeless tobacco: Never Used  . Alcohol Use: No     Colonoscopy:  PAP:  Bone density:  Lipid panel:  Allergies  Allergen Reactions  . Tums Hives    Current Outpatient Prescriptions  Medication Sig Dispense Refill  . albuterol (PROVENTIL HFA;VENTOLIN HFA) 108 (90 BASE) MCG/ACT inhaler Inhale 2 puffs into the lungs every 4 (four) hours as needed. Shortness of breath      . anastrozole (ARIMIDEX) 1 MG tablet Take 1 tablet (1 mg total) by mouth daily.  30 tablet  5  . Calcium Carbonate-Vitamin D (CALCIUM 600/VITAMIN D PO) Take 1 capsule by mouth 3 (three) times daily.       . cyclobenzaprine (FLEXERIL) 5 MG tablet Take 5 mg by mouth 3 (three) times daily as needed. For muscle spasms       . fluticasone (FLOVENT HFA) 44 MCG/ACT inhaler Inhale 2 puffs into the lungs 2 (two) times daily.       Marland Kitchen ipratropium-albuterol (DUONEB) 0.5-2.5 (3) MG/3ML SOLN Take 3 mLs by nebulization every 4 (four) hours as needed. Shortness of breath      . loratadine (CLARITIN) 10 MG tablet Take 10 mg by mouth daily.       . montelukast (SINGULAIR) 10 MG tablet Take 10 mg by mouth at bedtime.       . simvastatin (ZOCOR) 20 MG tablet Take 20 mg by mouth at bedtime.       Marland Kitchen  diltiazem (CARDIZEM) 60 MG tablet Take 60 mg by mouth 2 (two) times daily.       . furosemide (LASIX) 40 MG tablet Take 40 mg by mouth daily as needed. For fluid retention      . HYDROcodone-acetaminophen (NORCO) 5-325 MG per tablet Take 1-2 tablets by mouth every 4 (four) hours as needed. For pain      . traMADol (ULTRAM) 50 MG tablet Take 50-100 mg by mouth every 8 (eight) hours as needed. For pain        OBJECTIVE: Filed Vitals:   06/27/11 1129  BP: 129/78  Pulse: 78  Temp: 98.3 F (36.8 C)     Body mass index is 42.46 kg/(m^2).    ECOG  FS: 1  Physical Exam: HEENT:  Sclerae anicteric, conjunctivae pink.  Oropharynx clear.    Nodes:  No cervical, supraclavicular, or axillary lymphadenopathy palpated.  Breast Exam:  Right breast is unremarkable upon exam. No suspicious masses or skin changes.  Breast tissue is dense. Unable to elicit any discharge or drainage from the nipple. Left breast is status post mastectomy with tissue expander in fact. Well-healed incision, with no suspicious nodularity or skin changes. No evidence of local recurrence. Lungs:  Clear to auscultation bilaterally.  No crackles, rhonchi, or wheezes.   Heart:  Regular rate and rhythm.   Abdomen:  Soft, obese, nontender.  Positive bowel sounds.  No organomegaly or masses palpated.   Musculoskeletal:  No focal spinal tenderness to palpation.  Extremities:  Benign.  No peripheral edema or cyanosis.   Skin:  Benign.   Neuro:  Nonfocal.      LAB RESULTS: Lab Results  Component Value Date   WBC 4.3 06/27/2011   NEUTROABS 2.6 06/27/2011   HGB 13.1 06/27/2011   HCT 40.3 06/27/2011   MCV 82.1 06/27/2011   PLT 279 06/27/2011      Chemistry      Component Value Date/Time   NA 138 06/27/2011 1043   K 3.9 06/27/2011 1043   CL 105 06/27/2011 1043   CO2 25 06/27/2011 1043   BUN 13 06/27/2011 1043   CREATININE 0.75 06/27/2011 1043      Component Value Date/Time   CALCIUM 9.3 06/27/2011 1043   ALKPHOS 104 06/27/2011 1043   AST 17  06/27/2011 1043   ALT 11 06/27/2011 1043   BILITOT 0.7 06/27/2011 1043       Lab Results  Component Value Date   LABCA2 13 02/19/2011     STUDIES: Chest X-ray Jan 31, 2011 was unremarkable. Rib films same day also negative.   ASSESSMENT: 51 year old [place] woman recently moved here from [place],    (1)  left-sided breast cancer diagnosed March of 2010 and treated neo-adjuvantly as detailed above,    (2) definitive left modified radical mastectomy September of 2010 for a T2 N2 or stage III invasive lobular breast cancer, estrogen and progesterone receptor positive, HER-2 negative,   (3)  post mastectomy radiation completed January of 2011,  (4) briefly on tamoxifen, discontinued it because of the uterine lining concerns.  On anastrozole since May 2012 with good tolerance.   (5)  Other problems include left upper extremity lymphedema, borderline concern regarding genetic family history, history of cervical dysplasia, and history of dense breasts.  PLAN: Michaela Little we'll continue on the anastrozole, and I have refilled that prescription for her today. She would be due for her next right screening mammogram and BSGI at [facility] in August of this year. Due to her recent nipple discharge, however, we will go ahead and order a diagnostic mammogram at this time.  I am also referring Michaela Little for a dermatology evaluation, and I am referring her to Dr. Odis Luster to discuss completing her breast reconstruction on the left. She will return to see [place] in late September for routine followup with Dr. Darnelle Catalan, but knows to call prior that time with any changes or problems.  Brinden Kincheloe    06/27/2011

## 2011-06-27 NOTE — Telephone Encounter (Signed)
lmonvm adviisng the pt of her appt with the plastic surgeon named david bowers in april

## 2011-06-27 NOTE — Telephone Encounter (Signed)
gve the pt her sept,oct 2013 appt calendar along with the appt for the dermatolgist. Will call the pt bACK WITH THE APPT TO SEE THE PLASTIC SURGEON. THEIR OFFICE WAS CLOSED FOR LUNCH

## 2011-06-27 NOTE — Telephone Encounter (Signed)
Faxed orders over to [facility] for the pt's mammo appt

## 2011-07-03 ENCOUNTER — Emergency Department (HOSPITAL_COMMUNITY)
Admission: EM | Admit: 2011-07-03 | Discharge: 2011-07-03 | Disposition: A | Discharge status: Home / Self Care | Payer: Medicare Other | Attending: Emergency Medicine | Admitting: Emergency Medicine

## 2011-07-03 ENCOUNTER — Emergency Department (HOSPITAL_COMMUNITY): Payer: Medicare Other

## 2011-07-03 ENCOUNTER — Encounter (HOSPITAL_COMMUNITY): Payer: Self-pay | Admitting: Emergency Medicine

## 2011-07-03 DIAGNOSIS — R21 Rash and other nonspecific skin eruption: Secondary | ICD-10-CM | POA: Insufficient documentation

## 2011-07-03 DIAGNOSIS — Z79899 Other long term (current) drug therapy: Secondary | ICD-10-CM | POA: Diagnosis not present

## 2011-07-03 DIAGNOSIS — R109 Unspecified abdominal pain: Secondary | ICD-10-CM | POA: Insufficient documentation

## 2011-07-03 DIAGNOSIS — J45909 Unspecified asthma, uncomplicated: Secondary | ICD-10-CM | POA: Insufficient documentation

## 2011-07-03 DIAGNOSIS — N859 Noninflammatory disorder of uterus, unspecified: Secondary | ICD-10-CM | POA: Diagnosis not present

## 2011-07-03 DIAGNOSIS — I10 Essential (primary) hypertension: Secondary | ICD-10-CM | POA: Insufficient documentation

## 2011-07-03 DIAGNOSIS — D259 Leiomyoma of uterus, unspecified: Secondary | ICD-10-CM | POA: Diagnosis not present

## 2011-07-03 DIAGNOSIS — E785 Hyperlipidemia, unspecified: Secondary | ICD-10-CM | POA: Diagnosis not present

## 2011-07-03 DIAGNOSIS — Z853 Personal history of malignant neoplasm of breast: Secondary | ICD-10-CM | POA: Diagnosis not present

## 2011-07-03 DIAGNOSIS — R1031 Right lower quadrant pain: Secondary | ICD-10-CM | POA: Diagnosis not present

## 2011-07-03 DIAGNOSIS — N6459 Other signs and symptoms in breast: Secondary | ICD-10-CM | POA: Diagnosis not present

## 2011-07-03 DIAGNOSIS — N949 Unspecified condition associated with female genital organs and menstrual cycle: Secondary | ICD-10-CM | POA: Diagnosis not present

## 2011-07-03 DIAGNOSIS — B86 Scabies: Secondary | ICD-10-CM | POA: Insufficient documentation

## 2011-07-03 LAB — POCT I-STAT, CHEM 8
BUN: 10 mg/dL (ref 6–23)
Calcium, Ion: 1.22 mmol/L (ref 1.12–1.32)
Creatinine, Ser: 0.7 mg/dL (ref 0.50–1.10)
TCO2: 24 mmol/L (ref 0–100)

## 2011-07-03 LAB — DIFFERENTIAL
Eosinophils Relative: 3 % (ref 0–5)
Lymphocytes Relative: 32 % (ref 12–46)
Lymphs Abs: 1.7 10*3/uL (ref 0.7–4.0)
Monocytes Absolute: 0.2 10*3/uL (ref 0.1–1.0)

## 2011-07-03 LAB — URINALYSIS, ROUTINE W REFLEX MICROSCOPIC
Ketones, ur: 15 mg/dL — AB
Nitrite: NEGATIVE
Protein, ur: NEGATIVE mg/dL
Urobilinogen, UA: 1 mg/dL (ref 0.0–1.0)

## 2011-07-03 LAB — CBC
HCT: 40.6 % (ref 36.0–46.0)
MCV: 80.9 fL (ref 78.0–100.0)
RBC: 5.02 MIL/uL (ref 3.87–5.11)
RDW: 14.5 % (ref 11.5–15.5)
WBC: 5.2 10*3/uL (ref 4.0–10.5)

## 2011-07-03 LAB — URINE MICROSCOPIC-ADD ON

## 2011-07-03 LAB — WET PREP, GENITAL: Yeast Wet Prep HPF POC: NONE SEEN

## 2011-07-03 MED ORDER — PERMETHRIN 5 % EX CREA: TOPICAL_CREAM | Freq: Once | CUTANEOUS | Status: AC

## 2011-07-03 MED ORDER — HYDROXYZINE HCL 10 MG PO TABS
10.0000 mg | ORAL_TABLET | Freq: Once | ORAL | Status: AC
  Administered 2011-07-03: 10 mg via ORAL
  Filled 2011-07-03: qty 1

## 2011-07-03 MED ORDER — HYDROCODONE-ACETAMINOPHEN 5-325 MG PO TABS
1.0000 | ORAL_TABLET | Freq: Once | ORAL | Status: AC
  Administered 2011-07-03: 1 via ORAL
  Filled 2011-07-03: qty 1

## 2011-07-03 MED ORDER — HYDROCODONE-ACETAMINOPHEN 5-325 MG PO TABS: 1.0000 | ORAL_TABLET | ORAL | Status: AC | PRN

## 2011-07-03 NOTE — ED Notes (Signed)
Pt c/o right lower quadrant abdominal pain onset for couple of days. Pt also reports generalized rash for 3 days that is itchy. Pt reports only new product is [place] Body wash.

## 2011-07-03 NOTE — ED Notes (Signed)
Pt is in remission from breast cancer for three years - w/ left sided mastectomy.

## 2011-07-03 NOTE — Discharge Instructions (Signed)
Follow up with the Gastroenterologist or general surgeon listed above for further evaluation of your abdominal pain. Only use your pain medication for severe pain. Do not operate heavy machinery while on pain medication or muscle relaxer. Note that your pain medication contains acetaminophen (Tylenol) & its is not reccommended that you use additional acetaminophen (Tylenol) while taking this medication.   Abdominal Pain  Your exam might not show the exact reason you have abdominal pain. Since there are many different causes of abdominal pain, another checkup and more tests may be needed. It is very important to follow up for lasting (persistent) or worsening symptoms. A possible cause of abdominal pain in any person who still has his or her appendix is acute appendicitis. Appendicitis is often hard to diagnose. Normal blood tests, urine tests, ultrasound, and CT scans do not completely rule out early appendicitis or other causes of abdominal pain. Sometimes, only the changes that happen over time will allow appendicitis and other causes of abdominal pain to be determined. Other potential problems that may require surgery may also take time to become more apparent. Because of this, it is important that you follow all of the instructions below.   HOME CARE INSTRUCTIONS  Do not take laxatives unless directed by your caregiver. Rest as much as possible.  Do not eat solid food until your pain is gone: A diet of water, weak decaffeinated tea, broth or bouillon, gelatin, oral rehydration solutions (ORS), frozen ice pops, or ice chips may be helpful.  When pain is gone: Start a light diet (dry toast, crackers, applesauce, or white rice). Increase the diet slowly as long as it does not bother you. Eat no dairy products (including cheese and eggs) and no spicy, fatty, fried, or high-fiber foods.  Use no alcohol, caffeine, or cigarettes.  Take your regular medicines unless your caregiver told you not to.  Take any  prescribed medicine as directed.   SEEK IMMEDIATE MEDICAL CARE IF:  The pain does not go away.  You have a fever >101 that persists You keep throwing up (vomiting) or cannot drink liquids.  The pain becomes localized (Pain in the right side could possibly be appendicitis. In an adult, pain in the left lower portion of the abdomen could be colitis or diverticulitis). You pass bloody or black tarry stools.  You have shaking chills.  There is blood in your vomit or you see blood in your bowel movements.  Your bowel movements stop (become blocked) or you cannot pass gas.  You have bloody, frequent, or painful urination.  You have yellow discoloration in the skin or whites of the eyes.  Your stomach becomes bloated or bigger.  You have dizziness or fainting.  You have chest or back pain.     Scabies  Scabies are small bugs (mites) that burrow under the skin and cause red bumps and severe itching. These bugs can only be seen with a microscope. Scabies are highly contagious. They can spread easily from person to person by direct contact. They are also spread through sharing clothing or linens that have the scabies mites living in them. It is not unusual for an entire family to become infected through shared towels, clothing, or bedding.  HOME CARE INSTRUCTIONS  Your caregiver may prescribe a cream or lotion to kill the mites. If this cream is prescribed; massage the cream into the entire area of the body from the neck to the bottom of both feet. Also massage the cream into the scalp  and face if your child is less than 75 year old. Avoid the eyes and mouth.  Leave the cream on for 8 to12 hours. Do not wash your hands after application. Your child should bathe or shower after the 8 to 12 hour application period. Sometimes it is helpful to apply the cream to your child at right before bedtime.  One treatment is usually effective and will eliminate approximately 95% of infestations. For severe cases,  your caregiver may decide to repeat the treatment in 1 week. Everyone in your household should be treated with one application of the cream.  New rashes or burrows should not appear after successful treatment within 24 to 48 hours; however the itching and rash may last for 2 to 4 weeks after successful treatment. If your symptoms persist longer than this, see your caregiver.  Your caregiver also may prescribe a medication to help with the itching or to help the rash go away more quickly.  Scabies can live on clothing or linens for up to 3 days. Your entire child's recently used clothing, towels, stuffed toys, and bed linens should be washed in hot water and then dried in a dryer for at least 20 minutes on high heat. Items that cannot be washed should be enclosed in a plastic bag for at least 3 days.  To help relieve itching, bathe your child in a cool bath or apply cool washcloths to the affected areas.  Your child may return to school after treatment with the prescribed cream.  SEEK MEDICAL CARE IF:  The itching persists longer than 4 weeks after treatment.  The rash spreads or becomes infected (the area has red blisters or yellow-tan crust).

## 2011-07-03 NOTE — ED Provider Notes (Signed)
History     CSN: [id number]  Arrival date & time 07/03/11  1111   First MD Initiated Contact with Patient 07/03/11 1213      Chief Complaint  Patient presents with  . Abdominal Pain    Right lower quadrant  . Rash    (Consider location/radiation/quality/duration/timing/severity/associated sxs/prior treatment) HPI Comments: Patient with a history of mastectomy, hypertension, hyperlipidemia, and uterine fibroids presents to the emergency department with chief complaint of abdominal pain.  Onset of symptoms started 2 days ago located in the right lower car quadrant and suprapubic regions, described as a bowl intermittent pain that does not radiate and is associated with urinary frequency, urgency, and hesitancy.  Patient denies hematuria abnormal discharge, back pain, nausea, vomiting, or change in bowel movements.  Patient states she is not sexually active and has had menopause.  In addition the patient has a rash that appeared 4 days ago.  Patient states that she noticed black bumps and dots in the webs of her fingers that were extremely pruritic especially at night.  Sense the rash has spread to flexor surfaces need of neck, belt area in the groin.  Patient has no other complaints at this time.  Patient is a 51 y.o. female presenting with abdominal pain and rash.  Abdominal Pain The primary symptoms of the illness include abdominal pain. The primary symptoms of the illness do not include fever, shortness of breath, nausea, vomiting, diarrhea or dysuria.  Symptoms associated with the illness do not include chills, constipation, urgency or frequency.  Rash     Past Medical History  Diagnosis Date  . Uterine fibroid   . Hyperlipidemia   . Hypertension   . Asthma   . OSA (obstructive sleep apnea)   . Arthritis   . Prolapsed uterus   . Breast cancer     Past Surgical History  Procedure Date  . Eye surgery 1961    right  . Breast surgery   . Mastectomy     Family History    Problem Relation Age of Onset  . Breast cancer Mother   . Colon cancer Mother   . Hypotension Mother   . Asthma Mother   . Diabetes type II Mother   . Arthritis Mother   . Mental illness Brother   . Heart disease Brother   . Cerebral palsy Daughter   . Emphysema Brother     never smoker    History  Substance Use Topics  . Smoking status: Former Smoker -- 0.3 packs/day for 10 years    Types: Cigarettes    Quit date: 03/26/1988  . Smokeless tobacco: Never Used  . Alcohol Use: No    OB History    Grav Para Term Preterm Abortions TAB SAB Ect Mult Living                  Review of Systems  Constitutional: Negative for fever, chills and appetite change.  HENT: Negative for congestion.   Eyes: Negative for visual disturbance.  Respiratory: Negative for shortness of breath.   Cardiovascular: Negative for chest pain and leg swelling.  Gastrointestinal: Positive for abdominal pain. Negative for nausea, vomiting, diarrhea, constipation, blood in stool, abdominal distention and anal bleeding.  Genitourinary: Negative for dysuria, urgency and frequency.  Skin: Positive for rash.  Neurological: Negative for dizziness, syncope, weakness, light-headedness, numbness and headaches.  Psychiatric/Behavioral: Negative for confusion.  All other systems reviewed and are negative.    Allergies  Tums  Home Medications  Current Outpatient Rx  Name Route Sig Dispense Refill  . ALBUTEROL SULFATE HFA 108 (90 BASE) MCG/ACT IN AERS Inhalation Inhale 2 puffs into the lungs every 4 (four) hours as needed. Shortness of breath    . ANASTROZOLE 1 MG PO TABS Oral Take 1 tablet (1 mg total) by mouth daily. 30 tablet 5  . CALCIUM 600/VITAMIN D PO Oral Take 1 capsule by mouth 3 (three) times daily.     Marland Kitchen DILTIAZEM HCL 60 MG PO TABS Oral Take 60 mg by mouth 2 (two) times daily.     Marland Kitchen FLUTICASONE PROPIONATE  HFA 44 MCG/ACT IN AERO Inhalation Inhale 2 puffs into the lungs 2 (two) times daily.     .  FUROSEMIDE 40 MG PO TABS Oral Take 40 mg by mouth daily as needed. For fluid retention    . HYDROCODONE-ACETAMINOPHEN 5-325 MG PO TABS Oral Take 1-2 tablets by mouth every 4 (four) hours as needed. For pain    . IPRATROPIUM-ALBUTEROL 0.5-2.5 (3) MG/3ML IN SOLN Nebulization Take 3 mLs by nebulization every 4 (four) hours as needed. Shortness of breath    . LORATADINE 10 MG PO TABS Oral Take 10 mg by mouth daily.     Marland Kitchen MONTELUKAST SODIUM 10 MG PO TABS Oral Take 10 mg by mouth at bedtime.     Marland Kitchen SIMVASTATIN 20 MG PO TABS Oral Take 20 mg by mouth at bedtime.       BP 137/89  Pulse 83  Temp(Src) 98.3 F (36.8 C) (Oral)  Resp 20  SpO2 96%  Physical Exam  Nursing note and vitals reviewed. Constitutional: Vital signs are normal. She appears well-developed and well-nourished. No distress.  HENT:  Head: Normocephalic and atraumatic.  Mouth/Throat: Uvula is midline, oropharynx is clear and moist and mucous membranes are normal.  Eyes: Conjunctivae and EOM are normal. Pupils are equal, round, and reactive to light.  Neck: Normal range of motion and full passive range of motion without pain. Neck supple. No spinous process tenderness and no muscular tenderness present. No rigidity. No Brudzinski's sign noted.  Cardiovascular: Normal rate and regular rhythm.   Pulmonary/Chest: Effort normal and breath sounds normal. No accessory muscle usage. Not tachypneic. No respiratory distress.  Abdominal: Soft. Normal appearance. She exhibits no distension, no ascites, no pulsatile midline mass and no mass. There is tenderness. There is no CVA tenderness. No hernia.    Genitourinary: Pelvic exam was performed with patient prone.       Exam performed by Jaci Carrel,  exam chaperoned Date: 07/03/2011 Pelvic exam: normal external genitalia without evidence of trauma. VULVA: normal appearing vulva with no masses, tenderness or lesion. VAGINA: normal appearing vagina with normal color and discharge, no  lesions. CERVIX: normal appearing cervix without lesions, cervical motion tenderness absent, cervical os closed with out purulent discharge; vaginal discharge - white, Wet prep and DNA probe for chlamydia and GC obtained.   ADNEXA: normal adnexa in size, nontender and no masses UTERUS: uterus is normal size, shape, consistency and nontender.  RECTAL: non tender external hemmhroids present.    Lymphadenopathy:    She has no cervical adenopathy.  Neurological: She is alert.  Skin: Skin is warm and dry. No rash noted. She is not diaphoretic.       Rash in finger webs, flexor surfaces, in belt groin/region, slightly elevated pink linear burrows   Psychiatric: She has a normal mood and affect. Her speech is normal and behavior is normal.    ED Course  Procedures (including critical care time)   Labs Reviewed  URINALYSIS, ROUTINE W REFLEX MICROSCOPIC   No results found.   No diagnosis found.    MDM  Pt being worked up for RLQ and suprapubic abdominal pain w a history of uterine fibroids. No cervical motion tenderness on exam. Suspect fibroids as etiology, CT to see appendix. Pt care resumed by Wylene Simmer.       Jaci Carrel, [place] 07/03/11 1554

## 2011-07-03 NOTE — ED Notes (Signed)
Atarax sent to pharmacy

## 2011-07-03 NOTE — ED Provider Notes (Signed)
History     CSN: [id number]  Arrival date & time 07/03/11  1111   First MD Initiated Contact with Patient 07/03/11 1213      Chief Complaint  Patient presents with  . Abdominal Pain    Right lower quadrant  . Rash    (Consider location/radiation/quality/duration/timing/severity/associated sxs/prior treatment) HPI  Past Medical History  Diagnosis Date  . Uterine fibroid   . Hyperlipidemia   . Hypertension   . Asthma   . OSA (obstructive sleep apnea)   . Arthritis   . Prolapsed uterus   . Breast cancer     Past Surgical History  Procedure Date  . Eye surgery 1961    right  . Breast surgery   . Mastectomy     Family History  Problem Relation Age of Onset  . Breast cancer Mother   . Colon cancer Mother   . Hypotension Mother   . Asthma Mother   . Diabetes type II Mother   . Arthritis Mother   . Mental illness Brother   . Heart disease Brother   . Cerebral palsy Daughter   . Emphysema Brother     never smoker    History  Substance Use Topics  . Smoking status: Former Smoker -- 0.3 packs/day for 10 years    Types: Cigarettes    Quit date: 03/26/1988  . Smokeless tobacco: Never Used  . Alcohol Use: No    OB History    Grav Para Term Preterm Abortions TAB SAB Ect Mult Living                  Review of Systems  Allergies  Tums  Home Medications   Current Outpatient Rx  Name Route Sig Dispense Refill  . ALBUTEROL SULFATE HFA 108 (90 BASE) MCG/ACT IN AERS Inhalation Inhale 2 puffs into the lungs every 4 (four) hours as needed. Shortness of breath    . ANASTROZOLE 1 MG PO TABS Oral Take 1 tablet (1 mg total) by mouth daily. 30 tablet 5  . CALCIUM 600/VITAMIN D PO Oral Take 1 capsule by mouth 3 (three) times daily.     Marland Kitchen DILTIAZEM HCL 60 MG PO TABS Oral Take 60 mg by mouth 2 (two) times daily.     Marland Kitchen FLUTICASONE PROPIONATE  HFA 44 MCG/ACT IN AERO Inhalation Inhale 2 puffs into the lungs 2 (two) times daily.     . FUROSEMIDE 40 MG PO TABS Oral Take  40 mg by mouth daily as needed. For fluid retention    . HYDROCODONE-ACETAMINOPHEN 5-325 MG PO TABS Oral Take 1-2 tablets by mouth every 4 (four) hours as needed. For pain    . IPRATROPIUM-ALBUTEROL 0.5-2.5 (3) MG/3ML IN SOLN Nebulization Take 3 mLs by nebulization every 4 (four) hours as needed. Shortness of breath    . LORATADINE 10 MG PO TABS Oral Take 10 mg by mouth daily.     Marland Kitchen MONTELUKAST SODIUM 10 MG PO TABS Oral Take 10 mg by mouth at bedtime.     Marland Kitchen SIMVASTATIN 20 MG PO TABS Oral Take 20 mg by mouth at bedtime.       BP 133/82  Pulse 80  Temp(Src) 97.9 F (36.6 C) (Oral)  Resp 18  SpO2 100%  Physical Exam  ED Course  Procedures (including critical care time)  Labs Reviewed  URINALYSIS, ROUTINE W REFLEX MICROSCOPIC - Abnormal; Notable for the following:    Color, Urine AMBER (*) BIOCHEMICALS MAY BE AFFECTED BY COLOR  APPearance CLOUDY (*)    Bilirubin Urine SMALL (*)    Ketones, ur 15 (*)    Leukocytes, UA TRACE (*)    All other components within normal limits  URINE MICROSCOPIC-ADD ON - Abnormal; Notable for the following:    Squamous Epithelial / LPF MANY (*)    Bacteria, UA FEW (*)    All other components within normal limits  POCT I-STAT, CHEM 8 - Abnormal; Notable for the following:    Glucose, Bld 125 (*)    All other components within normal limits  WET PREP, GENITAL  CBC  DIFFERENTIAL  GC/CHLAMYDIA PROBE AMP, GENITAL   Ct Abdomen Pelvis Wo Contrast  07/03/2011  *RADIOLOGY REPORT*  Clinical Data:  right lower quadrant pain, rash  CT ABDOMEN AND PELVIS WITHOUT CONTRAST  Technique:  Multidetector CT imaging of the abdomen and pelvis was performed following the standard protocol without intravenous contrast.  Comparison: None.  Findings: Lung bases are unremarkable.  There is left breast prosthesis.  Sagittal images of the spine are unremarkable.  The unenhanced liver is unremarkable.  The pancreas, spleen and adrenal glands are unremarkable.  Unenhanced kidneys  are symmetrical in size.  No nephrolithiasis.  No hydronephrosis or hydroureter.  No calcified ureteral calculi are noted bilaterally.  Moderate colonic stool noted.  No small bowel obstruction.  No ascites or free air.  No adenopathy.  There is no pericecal inflammation.  Normal appendix is clearly visualized in axial image 67.  Mild enlarged uterus.  There is a nodular prominence of the right uterine fundus measures about 3.3 cm suspicious for fibroid. Probable nodular fibroid left uterine fundus measures 1 cm. Correlation with pelvic ultrasound is recommended.  No adnexal mass is noted.  No pelvic ascites or adenopathy.  Moderate stool noted within rectum.  IMPRESSION:  1. No nephrolithiasis.  No hydronephrosis or hydroureter. 2.  Moderate stool noted throughout the colon. 3.  Normal appendix is clearly visualized. 4.  No calcified ureteral calculi are noted. 5.  Nodular prominence of the right uterine fundus measures about 3.3 cm probable fibroid.  Probable fibroid measures 1 cm left uterine fundus.  Correlation with pelvic ultrasound is recommended.  Original Report Authenticated By: Natasha Mead, M.D.   US Transvaginal Non-ob  07/03/2011  *RADIOLOGY REPORT*  Clinical Data: Abdominal/pelvic pain.  History of uterine fibroids. Breast cancer.  Prolapsed uterus.  TRANSABDOMINAL AND TRANSVAGINAL ULTRASOUND OF PELVIS Technique:  Both transabdominal and transvaginal ultrasound examinations of the pelvis were performed. Transabdominal technique was performed for global imaging of the pelvis including uterus, ovaries, adnexal regions, and pelvic cul-de-sac.  Comparison: CT of earlier today.   It was necessary to proceed with endovaginal exam following the transabdominal exam to visualize the uterus, ovaries, and adnexa  .  Findings:  Uterus: 8.7 x 5.5 x 6.7 cm.  No focal abnormality identified.  The uterine echotexture is diffusely mildly heterogeneous.  Endometrium: Normal in thickness, at 5 mm.  There is a small  amount of endometrial fluid, including on image 32.  Right ovary:  Not visualized.  No adnexal mass.  Left ovary: Not visualized.  No adnexal mass.  Other findings: No free fluid  IMPRESSION:  1.  Heterogeneous uterine echotexture, without focal lesion identified.  The CT findings remain most suspicious for small uterine fibroids. 2.  Small amount of fluid within the endometrium without endometrial thickening.  The clinical history states that this patient is  postmenopausal.  Correlate with any history of post menopausal bleeding. Depending on  symptoms, consider either ultrasound follow-up at six - 12 weeks or further characterization with sonohysterogram. 3.  Lack of visualization of the ovaries.  Original Report Authenticated By: Consuello Bossier, M.D.   US Pelvis Complete  07/03/2011  *RADIOLOGY REPORT*  Clinical Data: Abdominal/pelvic pain.  History of uterine fibroids. Breast cancer.  Prolapsed uterus.  TRANSABDOMINAL AND TRANSVAGINAL ULTRASOUND OF PELVIS Technique:  Both transabdominal and transvaginal ultrasound examinations of the pelvis were performed. Transabdominal technique was performed for global imaging of the pelvis including uterus, ovaries, adnexal regions, and pelvic cul-de-sac.  Comparison: CT of earlier today.   It was necessary to proceed with endovaginal exam following the transabdominal exam to visualize the uterus, ovaries, and adnexa  .  Findings:  Uterus: 8.7 x 5.5 x 6.7 cm.  No focal abnormality identified.  The uterine echotexture is diffusely mildly heterogeneous.  Endometrium: Normal in thickness, at 5 mm.  There is a small amount of endometrial fluid, including on image 32.  Right ovary:  Not visualized.  No adnexal mass.  Left ovary: Not visualized.  No adnexal mass.  Other findings: No free fluid  IMPRESSION:  1.  Heterogeneous uterine echotexture, without focal lesion identified.  The CT findings remain most suspicious for small uterine fibroids. 2.  Small amount of fluid within  the endometrium without endometrial thickening.  The clinical history states that this patient is  postmenopausal.  Correlate with any history of post menopausal bleeding. Depending on symptoms, consider either ultrasound follow-up at six - 12 weeks or further characterization with sonohysterogram. 3.  Lack of visualization of the ovaries.  Original Report Authenticated By: Consuello Bossier, M.D.     1. Scabies   2. Abdominal  pain, other specified site       MDM  Took over care from L. Paz, PA-C -(please see her note for HPI, ROS and PE) review of US reveals uterine fibroids, wet prep without findings significant for issues.  Will discharge home with rx as planned.        Izola Price [place], [place] 07/03/11 1825

## 2011-07-04 LAB — GC/CHLAMYDIA PROBE AMP, GENITAL: GC Probe Amp, Genital: NEGATIVE

## 2011-07-04 NOTE — ED Provider Notes (Signed)
Medical screening examination/treatment/procedure(s) were performed by non-physician practitioner and as supervising physician I was immediately available for consultation/collaboration.  Toy Baker, MD 07/04/11 [phone]

## 2011-07-04 NOTE — ED Provider Notes (Signed)
Medical screening examination/treatment/procedure(s) were performed by non-physician practitioner and as supervising physician I was immediately available for consultation/collaboration. Jerline Linzy, MD, FACEP   Darrold Bezek L Demica Zook, MD 07/04/11 0032 

## 2011-07-20 ENCOUNTER — Encounter (HOSPITAL_COMMUNITY): Payer: Self-pay | Admitting: *Deleted

## 2011-07-20 ENCOUNTER — Emergency Department (HOSPITAL_COMMUNITY)
Admission: EM | Admit: 2011-07-20 | Discharge: 2011-07-20 | Disposition: A | Discharge status: Home / Self Care | Payer: Medicare Other | Attending: Emergency Medicine | Admitting: Emergency Medicine

## 2011-07-20 ENCOUNTER — Emergency Department (HOSPITAL_COMMUNITY): Payer: Medicare Other

## 2011-07-20 DIAGNOSIS — M545 Low back pain, unspecified: Secondary | ICD-10-CM | POA: Diagnosis not present

## 2011-07-20 DIAGNOSIS — J45909 Unspecified asthma, uncomplicated: Secondary | ICD-10-CM | POA: Diagnosis not present

## 2011-07-20 DIAGNOSIS — I10 Essential (primary) hypertension: Secondary | ICD-10-CM | POA: Insufficient documentation

## 2011-07-20 DIAGNOSIS — R21 Rash and other nonspecific skin eruption: Secondary | ICD-10-CM | POA: Diagnosis not present

## 2011-07-20 DIAGNOSIS — M47817 Spondylosis without myelopathy or radiculopathy, lumbosacral region: Secondary | ICD-10-CM | POA: Diagnosis not present

## 2011-07-20 DIAGNOSIS — Z853 Personal history of malignant neoplasm of breast: Secondary | ICD-10-CM | POA: Diagnosis not present

## 2011-07-20 DIAGNOSIS — M549 Dorsalgia, unspecified: Secondary | ICD-10-CM

## 2011-07-20 DIAGNOSIS — E785 Hyperlipidemia, unspecified: Secondary | ICD-10-CM | POA: Diagnosis not present

## 2011-07-20 DIAGNOSIS — Z87891 Personal history of nicotine dependence: Secondary | ICD-10-CM | POA: Diagnosis not present

## 2011-07-20 MED ORDER — DIAZEPAM 10 MG PO TABS: 10.0000 mg | ORAL_TABLET | Freq: Four times a day (QID) | ORAL | Status: AC | PRN

## 2011-07-20 NOTE — ED Notes (Signed)
Per EDP, not scabies. Pt is ok to leave room

## 2011-07-20 NOTE — ED Provider Notes (Signed)
History     CSN: [id number]  Arrival date & time 07/20/11  1236   None     Chief Complaint  Patient presents with  . Back Pain    (Consider location/radiation/quality/duration/timing/severity/associated sxs/prior treatment) HPI Comments: Michaela Little is a 51 y.o. female with complaint of chronic back pain. That is worse. Recently, when she rides in a car that was going over speed bumps. No recent trauma. She also has a itchy rash that has been present for several days. She's been treated in the past with medication for scabies. She has no fever, chills, nausea, vomiting, dysuria, urinary frequency, or change in bowel habits.  Patient is a 51 y.o. female presenting with back pain. The history is provided by the patient.  Back Pain     Past Medical History  Diagnosis Date  . Uterine fibroid   . Hyperlipidemia   . Hypertension   . Asthma   . OSA (obstructive sleep apnea)   . Arthritis   . Prolapsed uterus   . Breast cancer     Past Surgical History  Procedure Date  . Eye surgery 1961    right  . Breast surgery   . Mastectomy     Family History  Problem Relation Age of Onset  . Breast cancer Mother   . Colon cancer Mother   . Hypotension Mother   . Asthma Mother   . Diabetes type II Mother   . Arthritis Mother   . Mental illness Brother   . Heart disease Brother   . Cerebral palsy Daughter   . Emphysema Brother     never smoker    History  Substance Use Topics  . Smoking status: Former Smoker -- 0.3 packs/day for 10 years    Types: Cigarettes    Quit date: 03/26/1988  . Smokeless tobacco: Never Used  . Alcohol Use: No    OB History    Grav Para Term Preterm Abortions TAB SAB Ect Mult Living                  Review of Systems  Musculoskeletal: Positive for back pain.  All other systems reviewed and are negative.    Allergies  Tums  Home Medications   Current Outpatient Rx  Name Route Sig Dispense Refill  . ALBUTEROL SULFATE HFA 108 (90  BASE) MCG/ACT IN AERS Inhalation Inhale 2 puffs into the lungs every 4 (four) hours as needed. Shortness of breath    . ANASTROZOLE 1 MG PO TABS Oral Take 1 tablet (1 mg total) by mouth daily. 30 tablet 5  . CALCIUM 600/VITAMIN D PO Oral Take 1 capsule by mouth 3 (three) times daily.     Marland Kitchen DILTIAZEM HCL 60 MG PO TABS Oral Take 60 mg by mouth 2 (two) times daily.     Marland Kitchen FLUTICASONE PROPIONATE  HFA 44 MCG/ACT IN AERO Inhalation Inhale 2 puffs into the lungs 2 (two) times daily.     . FUROSEMIDE 40 MG PO TABS Oral Take 40 mg by mouth daily as needed. For fluid retention    . HYDROCODONE-ACETAMINOPHEN 5-325 MG PO TABS Oral Take 1-2 tablets by mouth every 4 (four) hours as needed. For pain    . IPRATROPIUM-ALBUTEROL 0.5-2.5 (3) MG/3ML IN SOLN Nebulization Take 3 mLs by nebulization every 4 (four) hours as needed. Shortness of breath    . LORATADINE 10 MG PO TABS Oral Take 10 mg by mouth daily.     Marland Kitchen MONTELUKAST SODIUM 10 MG  PO TABS Oral Take 10 mg by mouth at bedtime.     Marland Kitchen SIMVASTATIN 20 MG PO TABS Oral Take 20 mg by mouth every evening.    Marland Kitchen DIAZEPAM 10 MG PO TABS Oral Take 1 tablet (10 mg total) by mouth every 6 (six) hours as needed (muscle spasm). 20 tablet 0    BP 156/90  Pulse 57  Temp(Src) 98.2 F (36.8 C) (Oral)  Resp 20  SpO2 100%  Physical Exam  Nursing note and vitals reviewed. Constitutional: She is oriented to person, place, and time. She appears well-developed and well-nourished.  HENT:  Head: Normocephalic and atraumatic.  Eyes: Conjunctivae and EOM are normal. Pupils are equal, round, and reactive to light.  Neck: Normal range of motion and phonation normal. Neck supple.  Cardiovascular: Normal rate, regular rhythm and intact distal pulses.   Pulmonary/Chest: Effort normal and breath sounds normal. She exhibits no tenderness.  Abdominal: Soft. She exhibits no distension. There is no tenderness. There is no guarding.  Musculoskeletal: Normal range of motion.       Mild  tenderness with localized swelling at the right upper lumbar para vertebral musculature. No deformity. No limitation of motion.  Neurological: She is alert and oriented to person, place, and time. She has normal strength. She exhibits normal muscle tone.  Skin: Skin is warm and dry.       Scattered red, small macules, indistinct. There are no linear tracts. There are no vesicles, weepiness, fluctuance, or petechiae.  Psychiatric: She has a normal mood and affect. Her behavior is normal. Judgment and thought content normal.    ED Course  Procedures (including critical care time)  Labs Reviewed - No data to display Dg Lumbar Spine Complete  07/20/2011  *RADIOLOGY REPORT*  Clinical Data: Low back pain on the right  LUMBAR SPINE - COMPLETE 4+ VIEW  Comparison: 05/02/2011.  CT 07/03/2011.  Findings: Five lumbar-type vertebral bodies show normal alignment. Disc space heights are normal.  There is facet degeneration at L3- 4, L4-5 and L5-S1.  No pars defect.  There is some sacroiliac degenerative change as well.  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 Similar to previous studies.  Original Report Authenticated By: Thomasenia Sales, M.D.     1. Back pain   2. Rash       MDM  Nonspecific chronic low back pain, likely muscular. The rash she has is indistinct and does not appear to be scabies today. Doubt drug reaction, vasculitis, or allergic dermatitis.   Plan: Home Medications- valium; Home Treatments- Benadryl OTC prn itching and rash; Recommended follow up- PCP prn       Flint Melter, MD 07/20/11 [phone]

## 2011-07-20 NOTE — ED Notes (Signed)
Will leave pt in room and speak with EDP about isolation at this point

## 2011-07-20 NOTE — Discharge Instructions (Signed)
Use heat on the sore area of your back, and try the diazepam for muscle spasm. For the itching and rash uBack Pain, Adult Back pain is very common. The pain often gets better over time. The cause of back pain is usually not dangerous. Most people can learn to manage their back pain on their own.  HOME CARE   Stay active. Start with short walks on flat ground if you can. Try to walk farther each day.   Do not sit, drive, or stand in one place for more than 30 minutes. Do not stay in bed.   Do not avoid exercise or work. Activity can help your back heal faster.   Be careful when you bend or lift an object. Bend at your knees, keep the object close to you, and do not twist.   Sleep on a firm mattress. Lie on your side, and bend your knees. If you lie on your back, put a pillow under your knees.   Only take medicines as told by your doctor.   Put ice on the injured area.   Put ice in a plastic bag.   Place a towel between your skin and the bag.   Leave the ice on for 15 to 20 minutes, 3 to 4 times a day for the first 2 to 3 days. After that, you can switch between ice and heat packs.   Ask your doctor about back exercises or massage.   Avoid feeling anxious or stressed. Find good ways to deal with stress, such as exercise.  GET HELP RIGHT AWAY IF:   Your pain does not go away with rest or medicine.   Your pain does not go away in 1 week.   You have new problems.   You do not feel well.   The pain spreads into your legs.   You cannot control when you poop (bowel movement) or pee (urinate).   Your arms or legs feel weak or lose feeling (numbness).   You feel sick to your stomach (nauseous) or throw up (vomit).   You have belly (abdominal) pain.   You feel like you may pass out (faint).  MAKE SURE YOU:   Understand these instructions.   Will watch your condition.   Will get help right away if you are not doing well or get worse.  Document Released: 08/29/2007 Document  Revised: 03/01/2011 Document Reviewed: 07/31/2010 [facility] Patient Information 2012 [place], [place].se Benadryl 25 mg 4 times a day.    Back Exercises Back exercises help treat and prevent back injuries. The goal of back exercises is to increase the strength of your abdominal and back muscles and the flexibility of your back. These exercises should be started when you no longer have back pain. Back exercises include:  Pelvic Tilt. Lie on your back with your knees bent. Tilt your pelvis until the lower part of your back is against the floor. Hold this position 5 to 10 sec and repeat 5 to 10 times.   Knee to Chest. Pull first 1 knee up against your chest and hold for 20 to 30 seconds, repeat this with the other knee, and then both knees. This may be done with the other leg straight or bent, whichever feels better.   Sit-Ups or Curl-Ups. Bend your knees 90 degrees. Start with tilting your pelvis, and do a partial, slow sit-up, lifting your trunk only 30 to 45 degrees off the floor. Take at least 2 to 3 seconds for each sit-up.  Do not do sit-ups with your knees out straight. If partial sit-ups are difficult, simply do the above but with only tightening your abdominal muscles and holding it as directed.   Hip-Lift. Lie on your back with your knees flexed 90 degrees. Push down with your feet and shoulders as you raise your hips a couple inches off the floor; hold for 10 seconds, repeat 5 to 10 times.   Back arches. Lie on your stomach, propping yourself up on bent elbows. Slowly press on your hands, causing an arch in your low back. Repeat 3 to 5 times. Any initial stiffness and discomfort should lessen with repetition over time.   Shoulder-Lifts. Lie face down with arms beside your body. Keep hips and torso pressed to floor as you slowly lift your head and shoulders off the floor.  Do not overdo your exercises, especially in the beginning. Exercises may cause you some mild back discomfort which lasts  for a few minutes; however, if the pain is more severe, or lasts for more than 15 minutes, do not continue exercises until you see your caregiver. Improvement with exercise therapy for back problems is slow.  See your caregivers for assistance with developing a proper back exercise program. Document Released: 04/19/2004 Document Revised: 03/01/2011 Document Reviewed: 03/12/2005 [facility] Patient Information 2012 [place], [place].Allergic Reaction Allergic reactions can be caused by anything your body is sensitive to. Your body may be sensitive to food, medicines, molds, pollens, cockroaches, dust mites, pets, insect stings, and other things around you. An allergic reaction may cause puffiness (swelling), itching, sneezing, coughing, or problems breathing.  Allergies cannot be cured, but they can be controlled with medicine. Some allergies happen only at certain times of the year. Try to stay away from what causes your reaction if possible. Sometimes, it is hard to tell what causes your reaction. HOME CARE If you have a rash or red patches (hives) on your skin:  Take medicines as told by your doctor.   Do not drive or drink alcohol after taking medicines. They can make you sleepy.   Put cold cloths on your skin. Take baths in cool water. This will help your itching. Do not take hot baths or showers. Heat will make the itching worse.   If your allergies get worse, your doctor might give you other medicines. Talk to your doctor if problems continue.  GET HELP RIGHT AWAY IF:   You have trouble breathing.   You have a tight feeling in your chest or throat.   Your mouth gets puffy (swollen).   You have red, itchy patches on your skin (hives) that get worse.   You have itching all over your body.  MAKE SURE YOU:   Understand these instructions.   Will watch your condition.   Will get help right away if you are not doing well or get worse.  Document Released: 02/28/2009 Document Revised:  03/01/2011 Document Reviewed: 02/28/2009 [facility] Patient Information 2012 [place], [place].

## 2011-07-20 NOTE — ED Notes (Signed)
Discharge instructions reviewed with pt; verbalizes understanding.  No questions asked; no further c/o's voiced.  Pt ambulatory to lobby.  NAD noted. 

## 2011-07-20 NOTE — ED Notes (Signed)
Pt here for mid to lower back pain, not associated with any trauma.  Pt has had this pain for several months intermittently and the cause was not found when she was seen previously. Pain increases when "going over speed bumps", no neuro deficits with this.   Pt also notes that she continues to have itching after she was seen and treated for scabies.

## 2011-07-22 ENCOUNTER — Emergency Department (HOSPITAL_COMMUNITY)
Admission: EM | Admit: 2011-07-22 | Discharge: 2011-07-23 | Disposition: A | Discharge status: Home / Self Care | Payer: Medicare Other | Attending: Emergency Medicine | Admitting: Emergency Medicine

## 2011-07-22 DIAGNOSIS — X58XXXA Exposure to other specified factors, initial encounter: Secondary | ICD-10-CM | POA: Insufficient documentation

## 2011-07-22 DIAGNOSIS — I10 Essential (primary) hypertension: Secondary | ICD-10-CM | POA: Diagnosis not present

## 2011-07-22 DIAGNOSIS — M549 Dorsalgia, unspecified: Secondary | ICD-10-CM

## 2011-07-22 DIAGNOSIS — M545 Low back pain, unspecified: Secondary | ICD-10-CM | POA: Insufficient documentation

## 2011-07-22 DIAGNOSIS — IMO0002 Reserved for concepts with insufficient information to code with codable children: Secondary | ICD-10-CM | POA: Diagnosis not present

## 2011-07-22 DIAGNOSIS — T148XXA Other injury of unspecified body region, initial encounter: Secondary | ICD-10-CM | POA: Diagnosis not present

## 2011-07-22 DIAGNOSIS — J45909 Unspecified asthma, uncomplicated: Secondary | ICD-10-CM | POA: Insufficient documentation

## 2011-07-22 DIAGNOSIS — Z853 Personal history of malignant neoplasm of breast: Secondary | ICD-10-CM | POA: Insufficient documentation

## 2011-07-22 DIAGNOSIS — R6889 Other general symptoms and signs: Secondary | ICD-10-CM | POA: Diagnosis not present

## 2011-07-22 DIAGNOSIS — E785 Hyperlipidemia, unspecified: Secondary | ICD-10-CM | POA: Diagnosis not present

## 2011-07-22 LAB — URINALYSIS, ROUTINE W REFLEX MICROSCOPIC
Bilirubin Urine: NEGATIVE
Leukocytes, UA: NEGATIVE
Nitrite: NEGATIVE
Specific Gravity, Urine: 1.035 — ABNORMAL HIGH (ref 1.005–1.030)
pH: 6 (ref 5.0–8.0)

## 2011-07-22 MED ORDER — HYDROCODONE-ACETAMINOPHEN 5-325 MG PO TABS
1.0000 | ORAL_TABLET | Freq: Once | ORAL | Status: AC
  Administered 2011-07-23: 1 via ORAL
  Filled 2011-07-22: qty 1

## 2011-07-22 NOTE — ED Notes (Signed)
As per EMS, pt was seen at [facility] for same s/s, pt is c/o continued back pain, right flank radiating to right leg.

## 2011-07-22 NOTE — ED Notes (Signed)
AVW:[id number]<XB> Expected date:07/22/11<BR> Expected time:11:26 PM<BR> Means of arrival:Ambulance<BR> Comments:<BR> M48. Female. Back pain. Stable. 15 mins

## 2011-07-22 NOTE — ED Provider Notes (Signed)
History     CSN: [id number]  Arrival date & time 07/22/11  2338   First MD Initiated Contact with Patient 07/22/11 2340      Chief Complaint  Patient presents with  . Back Pain   HPI  History provided by the patient patient is a 51 year old female with history of hypertension, hyperlipidemia, who presents with complaints of persistent low back pain greatest on the right side. Patient reports having similar symptoms off and on for a long period time. She was seen and evaluated today she on emergency room for similar complaints. Patient had plain lumbar spine x-rays performed were negative for concerning injuries her condition. Patient states she was diagnosed with muscle spasms. Patient was taking medications for symptoms. Today patient states pain is a similar area but feels different. Pain radiates some to her abdomen and her lower extremities. She denies numbness or weakness in lower extremities. She denies any urinary or fecal incontinence, urinary retention or perineal numbness.       Past Medical History  Diagnosis Date  . Uterine fibroid   . Hyperlipidemia   . Hypertension   . Asthma   . OSA (obstructive sleep apnea)   . Arthritis   . Prolapsed uterus   . Breast cancer     Past Surgical History  Procedure Date  . Eye surgery 1961    right  . Breast surgery   . Mastectomy     Family History  Problem Relation Age of Onset  . Breast cancer Mother   . Colon cancer Mother   . Hypotension Mother   . Asthma Mother   . Diabetes type II Mother   . Arthritis Mother   . Mental illness Brother   . Heart disease Brother   . Cerebral palsy Daughter   . Emphysema Brother     never smoker    History  Substance Use Topics  . Smoking status: Former Smoker -- 0.3 packs/day for 10 years    Types: Cigarettes    Quit date: 03/26/1988  . Smokeless tobacco: Never Used  . Alcohol Use: No    OB History    Grav Para Term Preterm Abortions TAB SAB Ect Mult Living           Review of Systems  Constitutional: Negative for fever and chills.  HENT: Negative for neck pain.   Respiratory: Negative for shortness of breath.   Cardiovascular: Negative for chest pain.  Gastrointestinal: Negative for nausea and vomiting.  Genitourinary: Positive for flank pain. Negative for dysuria, frequency and hematuria.  Musculoskeletal: Positive for back pain. Negative for joint swelling.  Skin: Negative for rash.  Neurological: Negative for weakness and numbness.    Allergies  Tums  Home Medications   Current Outpatient Rx  Name Route Sig Dispense Refill  . ALBUTEROL SULFATE HFA 108 (90 BASE) MCG/ACT IN AERS Inhalation Inhale 2 puffs into the lungs every 4 (four) hours as needed. Shortness of breath    . ANASTROZOLE 1 MG PO TABS Oral Take 1 tablet (1 mg total) by mouth daily. 30 tablet 5  . CALCIUM 600/VITAMIN D PO Oral Take 1 capsule by mouth 3 (three) times daily.     Marland Kitchen DIAZEPAM 10 MG PO TABS Oral Take 1 tablet (10 mg total) by mouth every 6 (six) hours as needed (muscle spasm). 20 tablet 0  . DILTIAZEM HCL 60 MG PO TABS Oral Take 60 mg by mouth 2 (two) times daily.     Marland Kitchen FLUTICASONE PROPIONATE  HFA 44 MCG/ACT IN AERO Inhalation Inhale 2 puffs into the lungs 2 (two) times daily.     . FUROSEMIDE 40 MG PO TABS Oral Take 40 mg by mouth daily as needed. For fluid retention    . HYDROCODONE-ACETAMINOPHEN 5-325 MG PO TABS Oral Take 1-2 tablets by mouth every 4 (four) hours as needed. For pain    . IPRATROPIUM-ALBUTEROL 0.5-2.5 (3) MG/3ML IN SOLN Nebulization Take 3 mLs by nebulization every 4 (four) hours as needed. Shortness of breath    . LORATADINE 10 MG PO TABS Oral Take 10 mg by mouth daily.     Marland Kitchen MONTELUKAST SODIUM 10 MG PO TABS Oral Take 10 mg by mouth at bedtime.     Marland Kitchen SIMVASTATIN 20 MG PO TABS Oral Take 20 mg by mouth every evening.      BP 144/85  Pulse 78  Temp(Src) 98.5 F (36.9 C) (Oral)  Resp 16  SpO2 100%  Physical Exam  Nursing note and  vitals reviewed. Constitutional: She is oriented to person, place, and time. She appears well-developed and well-nourished. No distress.  HENT:  Head: Normocephalic.  Cardiovascular: Normal rate and regular rhythm.   Pulmonary/Chest: Effort normal and breath sounds normal.  Abdominal: Soft. There is no rebound, no guarding, no CVA tenderness, no tenderness at McBurney's point and negative Murphy's sign.       Mild bilateral lower abdominal tenderness.  Obese  Musculoskeletal:       Cervical back: Normal.       Thoracic back: Normal.       Lumbar back: She exhibits tenderness.       Back:  Neurological: She is alert and oriented to person, place, and time.  Skin: Skin is warm and dry. No rash noted.  Psychiatric: She has a normal mood and affect. Her behavior is normal.    ED Course  Procedures   Results for orders placed during the hospital encounter of 07/22/11  CBC      Component Value Range   WBC 5.7  4.0 - 10.5 (K/uL)   RBC 4.83  3.87 - 5.11 (MIL/uL)   Hemoglobin 13.1  12.0 - 15.0 (g/dL)   HCT 16.1  09.6 - 04.5 (%)   MCV 81.8  78.0 - 100.0 (fL)   MCH 27.1  26.0 - 34.0 (pg)   MCHC 33.2  30.0 - 36.0 (g/dL)   RDW 40.9  81.1 - 91.4 (%)   Platelets 243  150 - 400 (K/uL)  DIFFERENTIAL      Component Value Range   Neutrophils Relative 58  43 - 77 (%)   Neutro Abs 3.3  1.7 - 7.7 (K/uL)   Lymphocytes Relative 32  12 - 46 (%)   Lymphs Abs 1.8  0.7 - 4.0 (K/uL)   Monocytes Relative 7  3 - 12 (%)   Monocytes Absolute 0.4  0.1 - 1.0 (K/uL)   Eosinophils Relative 3  0 - 5 (%)   Eosinophils Absolute 0.2  0.0 - 0.7 (K/uL)   Basophils Relative 0  0 - 1 (%)   Basophils Absolute 0.0  0.0 - 0.1 (K/uL)  COMPREHENSIVE METABOLIC PANEL      Component Value Range   Sodium 139  135 - 145 (mEq/L)   Potassium 4.0  3.5 - 5.1 (mEq/L)   Chloride 104  96 - 112 (mEq/L)   CO2 27  19 - 32 (mEq/L)   Glucose, Bld 96  70 - 99 (mg/dL)   BUN 16  6 -  23 (mg/dL)   Creatinine, Ser 1.61  0.50 - 1.10  (mg/dL)   Calcium 9.0  8.4 - 09.6 (mg/dL)   Total Protein 7.2  6.0 - 8.3 (g/dL)   Albumin 3.6  3.5 - 5.2 (g/dL)   AST 13  0 - 37 (U/L)   ALT 7  0 - 35 (U/L)   Alkaline Phosphatase 114  39 - 117 (U/L)   Total Bilirubin 0.4  0.3 - 1.2 (mg/dL)   GFR calc non Af Amer 74 (*) >90 (mL/min)   GFR calc Af Amer 85 (*) >90 (mL/min)  URINALYSIS, ROUTINE W REFLEX MICROSCOPIC      Component Value Range   Color, Urine YELLOW  YELLOW    APPearance CLOUDY (*) CLEAR    Specific Gravity, Urine 1.035 (*) 1.005 - 1.030    pH 6.0  5.0 - 8.0    Glucose, UA NEGATIVE  NEGATIVE (mg/dL)   Hgb urine dipstick NEGATIVE  NEGATIVE    Bilirubin Urine NEGATIVE  NEGATIVE    Ketones, ur TRACE (*) NEGATIVE (mg/dL)   Protein, ur NEGATIVE  NEGATIVE (mg/dL)   Urobilinogen, UA 1.0  0.0 - 1.0 (mg/dL)   Nitrite NEGATIVE  NEGATIVE    Leukocytes, UA NEGATIVE  NEGATIVE         1. Back pain   2. Muscle strain       MDM  Patient seen and evaluated. Patient no acute distress. No concerning signs or symptoms, red flags for back pain.        Angus Seller, [place] 07/23/11 [phone]

## 2011-07-23 LAB — CBC
MCH: 27.1 pg (ref 26.0–34.0)
MCV: 81.8 fL (ref 78.0–100.0)
Platelets: 243 10*3/uL (ref 150–400)
RDW: 14.3 % (ref 11.5–15.5)

## 2011-07-23 LAB — DIFFERENTIAL
Basophils Absolute: 0 10*3/uL (ref 0.0–0.1)
Eosinophils Absolute: 0.2 10*3/uL (ref 0.0–0.7)
Eosinophils Relative: 3 % (ref 0–5)

## 2011-07-23 LAB — COMPREHENSIVE METABOLIC PANEL
ALT: 7 U/L (ref 0–35)
AST: 13 U/L (ref 0–37)
Calcium: 9 mg/dL (ref 8.4–10.5)
GFR calc Af Amer: 85 mL/min — ABNORMAL LOW (ref >90)
Glucose, Bld: 96 mg/dL (ref 70–99)
Sodium: 139 mEq/L (ref 135–145)
Total Protein: 7.2 g/dL (ref 6.0–8.3)

## 2011-07-23 MED ORDER — DIPHENHYDRAMINE HCL 50 MG/ML IJ SOLN
25.0000 mg | Freq: Once | INTRAMUSCULAR | Status: AC
  Administered 2011-07-23: 25 mg via INTRAMUSCULAR
  Filled 2011-07-23: qty 1

## 2011-07-23 NOTE — ED Notes (Signed)
ALP bedside 

## 2011-07-23 NOTE — ED Provider Notes (Signed)
Medical screening examination/treatment/procedure(s) were performed by non-physician practitioner and as supervising physician I was immediately available for consultation/collaboration.  Olivia Mackie, MD 07/23/11 [phone]

## 2011-07-23 NOTE — Discharge Instructions (Signed)
Continue your medications as prescribed for your low back pain and discomforts. Please followup with the primary care provider for continued evaluation and treatment of your symptoms. Return to the emergency room only for worsening symptoms or new concerning symptoms such as loss of feeling in her lower legs, weakness or inability to walk, loss of control your bowels or bladder, difficulty using the bathroom, fever, chills, sweats.   Back Pain, Adult Low back pain is very common. About 1 in 5 people have back pain.The cause of low back pain is rarely dangerous. The pain often gets better over time.About half of people with a sudden onset of back pain feel better in just 2 weeks. About 8 in 10 people feel better by 6 weeks.  CAUSES Some common causes of back pain include:  Strain of the muscles or ligaments supporting the spine.   Wear and tear (degeneration) of the spinal discs.   Arthritis.   Direct injury to the back.  DIAGNOSIS Most of the time, the direct cause of low back pain is not known.However, back pain can be treated effectively even when the exact cause of the pain is unknown.Answering your caregiver's questions about your overall health and symptoms is one of the most accurate ways to make sure the cause of your pain is not dangerous. If your caregiver needs more information, he or she may order lab work or imaging tests (X-rays or MRIs).However, even if imaging tests show changes in your back, this usually does not require surgery. HOME CARE INSTRUCTIONS For many people, back pain returns.Since low back pain is rarely dangerous, it is often a condition that people can learn to [facility] their own.   Remain active. It is stressful on the back to sit or stand in one place. Do not sit, drive, or stand in one place for more than 30 minutes at a time. Take short walks on level surfaces as soon as pain allows.Try to increase the length of time you walk each day.   Do not stay in  bed.Resting more than 1 or 2 days can delay your recovery.   Do not avoid exercise or work.Your body is made to move.It is not dangerous to be active, even though your back may hurt.Your back will likely heal faster if you return to being active before your pain is gone.   Pay attention to your body when you bend and lift. Many people have less discomfortwhen lifting if they bend their knees, keep the load close to their bodies,and avoid twisting. Often, the most comfortable positions are those that put less stress on your recovering back.   Find a comfortable position to sleep. Use a firm mattress and lie on your side with your knees slightly bent. If you lie on your back, put a pillow under your knees.   Only take over-the-counter or prescription medicines as directed by your caregiver. Over-the-counter medicines to reduce pain and inflammation are often the most helpful.Your caregiver may prescribe muscle relaxant drugs.These medicines help dull your pain so you can more quickly return to your normal activities and healthy exercise.   Put ice on the injured area.   Put ice in a plastic bag.   Place a towel between your skin and the bag.   Leave the ice on for 15 to 20 minutes, 3 to 4 times a day for the first 2 to 3 days. After that, ice and heat may be alternated to reduce pain and spasms.   Ask your  caregiver about trying back exercises and gentle massage. This may be of some benefit.   Avoid feeling anxious or stressed.Stress increases muscle tension and can worsen back pain.It is important to recognize when you are anxious or stressed and learn ways to manage it.Exercise is a great option.  SEEK MEDICAL CARE IF:  You have pain that is not relieved with rest or medicine.   You have pain that does not improve in 1 week.   You have new symptoms.   You are generally not feeling well.  SEEK IMMEDIATE MEDICAL CARE IF:   You have pain that radiates from your back into your  legs.   You develop new bowel or bladder control problems.   You have unusual weakness or numbness in your arms or legs.   You develop nausea or vomiting.   You develop abdominal pain.   You feel faint.  Document Released: 03/12/2005 Document Revised: 03/01/2011 Document Reviewed: 07/31/2010 [facility] Patient Information 2012 [place], [place].     Back Exercises Back exercises help treat and prevent back injuries. The goal of back exercises is to increase the strength of your abdominal and back muscles and the flexibility of your back. These exercises should be started when you no longer have back pain. Back exercises include:  Pelvic Tilt. Lie on your back with your knees bent. Tilt your pelvis until the lower part of your back is against the floor. Hold this position 5 to 10 sec and repeat 5 to 10 times.   Knee to Chest. Pull first 1 knee up against your chest and hold for 20 to 30 seconds, repeat this with the other knee, and then both knees. This may be done with the other leg straight or bent, whichever feels better.   Sit-Ups or Curl-Ups. Bend your knees 90 degrees. Start with tilting your pelvis, and do a partial, slow sit-up, lifting your trunk only 30 to 45 degrees off the floor. Take at least 2 to 3 seconds for each sit-up. Do not do sit-ups with your knees out straight. If partial sit-ups are difficult, simply do the above but with only tightening your abdominal muscles and holding it as directed.   Hip-Lift. Lie on your back with your knees flexed 90 degrees. Push down with your feet and shoulders as you raise your hips a couple inches off the floor; hold for 10 seconds, repeat 5 to 10 times.   Back arches. Lie on your stomach, propping yourself up on bent elbows. Slowly press on your hands, causing an arch in your low back. Repeat 3 to 5 times. Any initial stiffness and discomfort should lessen with repetition over time.   Shoulder-Lifts. Lie face down with arms beside your  body. Keep hips and torso pressed to floor as you slowly lift your head and shoulders off the floor.  Do not overdo your exercises, especially in the beginning. Exercises may cause you some mild back discomfort which lasts for a few minutes; however, if the pain is more severe, or lasts for more than 15 minutes, do not continue exercises until you see your caregiver. Improvement with exercise therapy for back problems is slow.  See your caregivers for assistance with developing a proper back exercise program. Document Released: 04/19/2004 Document Revised: 03/01/2011 Document Reviewed: 03/12/2005 [facility] Patient Information 2012 [place], [place].   RESOURCE GUIDE  Dental Problems  Patients with Medicaid: [facility]  [facility] [address]                                           [phone] [address] [employer] [address] Phone:  [phone]                                                  Phone:  [phone]  If unable to pay or uninsured, contact:  [facility] or [facility]. to become qualified for the adult dental clinic.  Chronic Pain Problems Contact [facility]  [phone] Patients need to be referred by their primary care doctor.  Insufficient Money for Medicine Contact [employer]:  call "211" or [facility] ([phone].  No Primary Care Doctor Call [facility]  [phone] Other agencies that provide inexpensive medical care    [facility]  [phone]    [facility]  [phone]    [facility]  [phone]    [facility]  [phone]    [facility]  [phone]    [facility]  [phone]  Psychological Services [facility]  [phone] [facility] Services  [phone] [facility]   [phone] (emergency services [phone])  Substance Abuse Resources [facility]  217-110-8277 [facility]  [phone] [facility] [place] 989-060-8345 Floydene Flock 480-120-5080 Residential & Outpatient Substance Abuse Program  7701280131  Abuse/Neglect [facility] Child Abuse Hotline 972-505-6469 [facility] Child Abuse Hotline (480)752-7725 (After Hours)  Emergency Shelter [facility] Ministries (602)005-1276  Maternity Homes [facility] at [facility] [place] [facility] [place] 2341976584 [facility] 6572935986  MRSA Hotline #:   332-262-7637    [facility] Resources  [facility] [place]     [employer]                          [facility]. [address] [place]                       [address]      [address]  Blondell Reveal Phone:  [phone]                                   Phone:  [phone]                 Phone:  [phone]  [facility] Mental [facility] Phone:  [phone]  [facility] Child Abuse Hotline 949-414-7500 [phone] (After Hours)

## 2011-07-31 DIAGNOSIS — L509 Urticaria, unspecified: Secondary | ICD-10-CM | POA: Diagnosis not present

## 2011-07-31 DIAGNOSIS — B359 Dermatophytosis, unspecified: Secondary | ICD-10-CM | POA: Diagnosis not present

## 2011-07-31 DIAGNOSIS — L299 Pruritus, unspecified: Secondary | ICD-10-CM | POA: Diagnosis not present

## 2011-07-31 DIAGNOSIS — Z79899 Other long term (current) drug therapy: Secondary | ICD-10-CM | POA: Diagnosis not present

## 2011-08-07 ENCOUNTER — Ambulatory Visit: Payer: Medicare Other | Admitting: Pulmonary Disease

## 2011-08-07 DIAGNOSIS — N879 Dysplasia of cervix uteri, unspecified: Secondary | ICD-10-CM | POA: Diagnosis not present

## 2011-08-22 ENCOUNTER — Other Ambulatory Visit (HOSPITAL_COMMUNITY)
Admission: RE | Admit: 2011-08-22 | Discharge: 2011-08-22 | Disposition: A | Discharge status: Home / Self Care | Payer: Medicare Other | Source: Ambulatory Visit | Attending: Gynecology | Admitting: Gynecology

## 2011-08-22 ENCOUNTER — Emergency Department (HOSPITAL_COMMUNITY)
Admission: EM | Admit: 2011-08-22 | Discharge: 2011-08-22 | Disposition: A | Discharge status: Home / Self Care | Payer: Medicare Other | Attending: Emergency Medicine | Admitting: Emergency Medicine

## 2011-08-22 ENCOUNTER — Ambulatory Visit: Payer: Medicare Other | Attending: Gynecology | Admitting: Gynecology

## 2011-08-22 ENCOUNTER — Encounter: Payer: Self-pay | Admitting: Gynecology

## 2011-08-22 ENCOUNTER — Encounter (HOSPITAL_COMMUNITY): Payer: Self-pay | Admitting: Family Medicine

## 2011-08-22 ENCOUNTER — Other Ambulatory Visit: Payer: Self-pay | Admitting: *Deleted

## 2011-08-22 VITALS — BP 148/90 | HR 76 | Temp 98.3°F | Resp 16 | Ht 67.0 in | Wt 268.6 lb

## 2011-08-22 DIAGNOSIS — I10 Essential (primary) hypertension: Secondary | ICD-10-CM | POA: Diagnosis not present

## 2011-08-22 DIAGNOSIS — Z760 Encounter for issue of repeat prescription: Secondary | ICD-10-CM | POA: Diagnosis not present

## 2011-08-22 DIAGNOSIS — Z853 Personal history of malignant neoplasm of breast: Secondary | ICD-10-CM | POA: Diagnosis not present

## 2011-08-22 DIAGNOSIS — E785 Hyperlipidemia, unspecified: Secondary | ICD-10-CM | POA: Diagnosis not present

## 2011-08-22 DIAGNOSIS — R87612 Low grade squamous intraepithelial lesion on cytologic smear of cervix (LGSIL): Secondary | ICD-10-CM | POA: Diagnosis not present

## 2011-08-22 DIAGNOSIS — N879 Dysplasia of cervix uteri, unspecified: Secondary | ICD-10-CM

## 2011-08-22 DIAGNOSIS — D069 Carcinoma in situ of cervix, unspecified: Secondary | ICD-10-CM | POA: Diagnosis not present

## 2011-08-22 DIAGNOSIS — J45909 Unspecified asthma, uncomplicated: Secondary | ICD-10-CM | POA: Insufficient documentation

## 2011-08-22 DIAGNOSIS — M129 Arthropathy, unspecified: Secondary | ICD-10-CM | POA: Diagnosis not present

## 2011-08-22 DIAGNOSIS — Z79899 Other long term (current) drug therapy: Secondary | ICD-10-CM | POA: Diagnosis not present

## 2011-08-22 DIAGNOSIS — D259 Leiomyoma of uterus, unspecified: Secondary | ICD-10-CM | POA: Diagnosis not present

## 2011-08-22 DIAGNOSIS — Z87891 Personal history of nicotine dependence: Secondary | ICD-10-CM | POA: Insufficient documentation

## 2011-08-22 DIAGNOSIS — G4733 Obstructive sleep apnea (adult) (pediatric): Secondary | ICD-10-CM | POA: Diagnosis not present

## 2011-08-22 DIAGNOSIS — N870 Mild cervical dysplasia: Secondary | ICD-10-CM | POA: Insufficient documentation

## 2011-08-22 DIAGNOSIS — Z124 Encounter for screening for malignant neoplasm of cervix: Secondary | ICD-10-CM | POA: Insufficient documentation

## 2011-08-22 DIAGNOSIS — N950 Postmenopausal bleeding: Secondary | ICD-10-CM

## 2011-08-22 DIAGNOSIS — R51 Headache: Secondary | ICD-10-CM | POA: Diagnosis not present

## 2011-08-22 MED ORDER — DIAZEPAM 5 MG PO TABS
5.0000 mg | ORAL_TABLET | Freq: Once | ORAL | Status: AC
  Administered 2011-08-22: 5 mg via ORAL
  Filled 2011-08-22: qty 1

## 2011-08-22 MED ORDER — DILTIAZEM HCL 60 MG PO TABS: 60.0000 mg | ORAL_TABLET | Freq: Two times a day (BID) | ORAL | Status: DC

## 2011-08-22 MED ORDER — IBUPROFEN 200 MG PO TABS
600.0000 mg | ORAL_TABLET | Freq: Once | ORAL | Status: AC
  Administered 2011-08-22: 600 mg via ORAL
  Filled 2011-08-22: qty 3

## 2011-08-22 NOTE — Progress Notes (Signed)
Consult Note: Gyn-Onc   Michaela Little 51 y.o. female  Chief Complaint  Patient presents with  . abnormal  CK[facility]    New consult      HPI: 51 year old African American female seen in consultation at the request of Dr. Tracey Harries regarding management of an abnormal Pap smear. Records from [facility] are reviewed. Apparently in January 2012 she had a Pap smear suggestive of high-grade dysplasia. In March 2012 she underwent a cold knife conization. Cold-like conization specimen was negative for any cervical dysplasia. However an endocervical curettage showed probable low-grade squamous intraepithelial lesion.  The patient was subsequently seen by Dr. Betsy Pries in January 2013 at which time she was found to have cervical stenosis. A Pap smear was negative although endocervical sampling was not certain.  The patient has no other gynecologic symptoms she is postmenopausal.  Review of Systems:10 point review of systems is negative as noted above.   Vitals: Blood pressure 148/90, pulse 76, temperature 98.3 F (36.8 C), temperature source Oral, resp. rate 16, height 5\' 7"  (1.702 m), weight 268 lb 9.6 oz (121.836 kg).  Physical Exam: General : The patient is a healthy woman who is suffering from an acute onset of a headache started approximately 15 minutes ago.Marland Kitchen  HEENT: normocephalic, extraoccular movements normal; neck is supple without thyromegally  Lynphnodes: Supraclavicular and inguinal nodes not enlarged  Abdomen: Soft, obese, non-tender, no ascites, no organomegally, no masses, no hernias  Pelvic:  EGBUS: Normal female  Vagina: Normal, no lesions  Urethra and Bladder: Normal, non-tender  Cervix: Is difficult to visualize due to laxity of the vaginal walls. It does appear to be stenotic and flush with the apex of the vagina. Uterus: Difficult to outline due to the patient's habitus  Bi-manual examination: Non-tender; no adenxal masses or nodularity  Rectal: normal sphincter tone, no  masses, no blood  Lower extremities: No edema or varicosities. 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 Subsequently she's had a cold-like conization and at that time had low-grade squamous lesion in the endocervical curettings. Subsequently she has cervical stenosis making endocervical sampling difficult.  A Pap smear from today as an pain and pending. If this does not show endocervical cells and I recommend that the patient undergo a repeat conization and endocervical curettage.    Allergies  Allergen Reactions  . Calcium Carbonate Antacid Hives    Past Medical History  Diagnosis Date  . Uterine fibroid   . Hyperlipidemia   . Hypertension   . Asthma   . OSA (obstructive sleep apnea)   . Arthritis   . Prolapsed uterus   . Breast cancer     Past Surgical History  Procedure Date  . Eye surgery 1961    right  . Breast surgery   . Mastectomy     Current Outpatient Prescriptions  Medication Sig Dispense Refill  . albuterol (PROVENTIL HFA;VENTOLIN HFA) 108 (90 BASE) MCG/ACT inhaler Inhale 2 puffs into the lungs every 4 (four) hours as needed. Shortness of breath      . anastrozole (ARIMIDEX) 1 MG tablet Take 1 tablet (1 mg total) by mouth daily.  30 tablet  5  . fluticasone (FLOVENT HFA) 44 MCG/ACT inhaler Inhale 2 puffs into the lungs 2 (two) times daily.       . furosemide (LASIX) 40 MG tablet Take 40 mg by mouth daily as needed. For fluid retention      . loratadine (  CLARITIN) 10 MG tablet Take 10 mg by mouth daily.       . Calcium Carbonate-Vitamin D (CALCIUM 600/VITAMIN D PO) Take 1 capsule by mouth 3 (three) times daily.       Marland Kitchen diltiazem (CARDIZEM) 60 MG tablet Take 60 mg by mouth 2 (two) times daily.       Marland Kitchen ipratropium-albuterol (DUONEB) 0.5-2.5 (3) MG/3ML SOLN Take 3 mLs by nebulization every 4 (four) hours as needed. Shortness of breath      . montelukast (SINGULAIR) 10 MG tablet Take 10 mg  by mouth at bedtime.       . simvastatin (ZOCOR) 20 MG tablet Take 20 mg by mouth every evening.        History   Social History  . Marital Status: Single    Spouse Name: N/A    Number of Children: 5  . Years of Education: N/A   Occupational History  . Disabled    Social History Main Topics  . Smoking status: Former Smoker -- 0.3 packs/day for 10 years    Types: Cigarettes    Quit date: 03/26/1988  . Smokeless tobacco: Never Used  . Alcohol Use: No  . Drug Use: No  . Sexually Active: Not on file   Other Topics Concern  . Not on file   Social History Narrative  . No narrative on file    Family History  Problem Relation Age of Onset  . Breast cancer Mother   . Colon cancer Mother   . Hypotension Mother   . Asthma Mother   . Diabetes type II Mother   . Arthritis Mother   . Mental illness Brother   . Heart disease Brother   . Cerebral palsy Daughter   . Emphysema Brother     never smoker      Jeannette Corpus, MD 08/22/2011, 1:42 PM

## 2011-08-22 NOTE — Discharge Instructions (Signed)
Headaches, Frequently Asked Questions MIGRAINE HEADACHES Q: What is migraine? What causes it? How can I treat it? A: Generally, migraine headaches begin as a dull ache. Then they develop into a constant, throbbing, and pulsating pain. You may experience pain at the temples. You may experience pain at the front or back of one or both sides of the head. The pain is usually accompanied by a combination of:  Nausea.   Vomiting.   Sensitivity to light and noise.  Some people (about 15%) experience an aura (see below) before an attack. The cause of migraine is believed to be chemical reactions in the brain. Treatment for migraine may include over-the-counter or prescription medications. It may also include self-help techniques. These include relaxation training and biofeedback.  Q: What is an aura? A: About 15% of people with migraine get an "aura". This is a sign of neurological symptoms that occur before a migraine headache. You may see wavy or jagged lines, dots, or flashing lights. You might experience tunnel vision or blind spots in one or both eyes. The aura can include visual or auditory hallucinations (something imagined). It may include disruptions in smell (such as strange odors), taste or touch. Other symptoms include:  Numbness.   A "pins and needles" sensation.   Difficulty in recalling or speaking the correct word.  These neurological events may last as long as 60 minutes. These symptoms will fade as the headache begins. Q: What is a trigger? A: Certain physical or environmental factors can lead to or "trigger" a migraine. These include:  Foods.   Hormonal changes.   Weather.   Stress.  It is important to remember that triggers are different for everyone. To help prevent migraine attacks, you need to figure out which triggers affect you. Keep a headache diary. This is a good way to track triggers. The diary will help you talk to your healthcare professional about your  condition. Q: Does weather affect migraines? A: Bright sunshine, hot, humid conditions, and drastic changes in barometric pressure may lead to, or "trigger," a migraine attack in some people. But studies have shown that weather does not act as a trigger for everyone with migraines. Q: What is the link between migraine and hormones? A: Hormones start and regulate many of your body's functions. Hormones keep your body in balance within a constantly changing environment. The levels of hormones in your body are unbalanced at times. Examples are during menstruation, pregnancy, or menopause. That can lead to a migraine attack. In fact, about three quarters of all women with migraine report that their attacks are related to the menstrual cycle.  Q: Is there an increased risk of stroke for migraine sufferers? A: The likelihood of a migraine attack causing a stroke is very remote. That is not to say that migraine sufferers cannot have a stroke associated with their migraines. In persons under age 40, the most common associated factor for stroke is migraine headache. But over the course of a person's normal life span, the occurrence of migraine headache may actually be associated with a reduced risk of dying from cerebrovascular disease due to stroke.  Q: What are acute medications for migraine? A: Acute medications are used to treat the pain of the headache after it has started. Examples over-the-counter medications, NSAIDs, ergots, and triptans.  Q: What are the triptans? A: Triptans are the newest class of abortive medications. They are specifically targeted to treat migraine. Triptans are vasoconstrictors. They moderate some chemical reactions in the brain.   The triptans work on receptors in your brain. Triptans help to restore the balance of a neurotransmitter called serotonin. Fluctuations in levels of serotonin are thought to be a main cause of migraine.  Q: Are over-the-counter medications for migraine  effective? A: Over-the-counter, or "OTC," medications may be effective in relieving mild to moderate pain and associated symptoms of migraine. But you should see your caregiver before beginning any treatment regimen for migraine.  Q: What are preventive medications for migraine? A: Preventive medications for migraine are sometimes referred to as "prophylactic" treatments. They are used to reduce the frequency, severity, and length of migraine attacks. Examples of preventive medications include antiepileptic medications, antidepressants, beta-blockers, calcium channel blockers, and NSAIDs (nonsteroidal anti-inflammatory drugs). Q: Why are anticonvulsants used to treat migraine? A: During the past few years, there has been an increased interest in antiepileptic drugs for the prevention of migraine. They are sometimes referred to as "anticonvulsants". Both epilepsy and migraine may be caused by similar reactions in the brain.  Q: Why are antidepressants used to treat migraine? A: Antidepressants are typically used to treat people with depression. They may reduce migraine frequency by regulating chemical levels, such as serotonin, in the brain.  Q: What alternative therapies are used to treat migraine? A: The term "alternative therapies" is often used to describe treatments considered outside the scope of conventional Western medicine. Examples of alternative therapy include acupuncture, acupressure, and yoga. Another common alternative treatment is herbal therapy. Some herbs are believed to relieve headache pain. Always discuss alternative therapies with your caregiver before proceeding. Some herbal products contain arsenic and other toxins. TENSION HEADACHES Q: What is a tension-type headache? What causes it? How can I treat it? A: Tension-type headaches occur randomly. They are often the result of temporary stress, anxiety, fatigue, or anger. Symptoms include soreness in your temples, a tightening  band-like sensation around your head (a "vice-like" ache). Symptoms can also include a pulling feeling, pressure sensations, and contracting head and neck muscles. The headache begins in your forehead, temples, or the back of your head and neck. Treatment for tension-type headache may include over-the-counter or prescription medications. Treatment may also include self-help techniques such as relaxation training and biofeedback. CLUSTER HEADACHES Q: What is a cluster headache? What causes it? How can I treat it? A: Cluster headache gets its name because the attacks come in groups. The pain arrives with little, if any, warning. It is usually on one side of the head. A tearing or bloodshot eye and a runny nose on the same side of the headache may also accompany the pain. Cluster headaches are believed to be caused by chemical reactions in the brain. They have been described as the most severe and intense of any headache type. Treatment for cluster headache includes prescription medication and oxygen. SINUS HEADACHES Q: What is a sinus headache? What causes it? How can I treat it? A: When a cavity in the bones of the face and skull (a sinus) becomes inflamed, the inflammation will cause localized pain. This condition is usually the result of an allergic reaction, a tumor, or an infection. If your headache is caused by a sinus blockage, such as an infection, you will probably have a fever. An x-ray will confirm a sinus blockage. Your caregiver's treatment might include antibiotics for the infection, as well as antihistamines or decongestants.  REBOUND HEADACHES Q: What is a rebound headache? What causes it? How can I treat it? A: A pattern of taking acute headache medications too   often can lead to a condition known as "rebound headache." A pattern of taking too much headache medication includes taking it more than 2 days per week or in excessive amounts. That means more than the label or a caregiver advises.  With rebound headaches, your medications not only stop relieving pain, they actually begin to cause headaches. Doctors treat rebound headache by tapering the medication that is being overused. Sometimes your caregiver will gradually substitute a different type of treatment or medication. Stopping may be a challenge. Regularly overusing a medication increases the potential for serious side effects. Consult a caregiver if you regularly use headache medications more than 2 days per week or more than the label advises. ADDITIONAL QUESTIONS AND ANSWERS Q: What is biofeedback? A: Biofeedback is a self-help treatment. Biofeedback uses special equipment to monitor your body's involuntary physical responses. Biofeedback monitors:  Breathing.   Pulse.   Heart rate.   Temperature.   Muscle tension.   Brain activity.  Biofeedback helps you refine and perfect your relaxation exercises. You learn to control the physical responses that are related to stress. Once the technique has been mastered, you do not need the equipment any more. Q: Are headaches hereditary? A: Four out of five (80%) of people that suffer report a family history of migraine. Scientists are not sure if this is genetic or a family predisposition. Despite the uncertainty, a child has a 50% chance of having migraine if one parent suffers. The child has a 75% chance if both parents suffer.  Q: Can children get headaches? A: By the time they reach high school, most young people have experienced some type of headache. Many safe and effective approaches or medications can prevent a headache from occurring or stop it after it has begun.  Q: What type of doctor should I see to diagnose and treat my headache? A: Start with your primary caregiver. Discuss his or her experience and approach to headaches. Discuss methods of classification, diagnosis, and treatment. Your caregiver may decide to recommend you to a headache specialist, depending upon  your symptoms or other physical conditions. Having diabetes, allergies, etc., may require a more comprehensive and inclusive approach to your headache. The National Head[employer] Foundation will provide, upon request, a list of NHF physician members in your state. Document Released: 06/02/2003 Document Revised: 03/01/2011 Document Reviewed: 11/10/2007 ExitCare Patient Information 2012 [employer].Headache, General, Unknown Cause The specific cause of your headache may not have been found today. There are many causes and types of headache. A few common ones are:  Tension headache.   Migraine.   Infections (examples: dental and sinus infections).   Bone and/or joint problems in the neck or jaw.   Depression.   Eye problems.  These headaches are not life threatening.  Headaches can sometimes be diagnosed by a patient history and a physical exam. Sometimes, lab and imaging studies (such as x-ray and/or CT scan) are used to rule out more serious problems. In some cases, a spinal tap (lumbar puncture) may be requested. There are many times when your exam and tests may be normal on the first visit even when there is a serious problem causing your headaches. Because of that, it is very important to follow up with your doctor or local clinic for further evaluation. FINDING OUT THE RESULTS OF TESTS  If a radiology test was performed, a radiologist will review your results.   You will be contacted by the emergency department or your physician if any test results   require a change in your treatment plan.   Not all test results may be available during your visit. If your test results are not back during the visit, make an appointment with your caregiver to find out the results. Do not assume everything is normal if you have not heard from your caregiver or the medical facility. It is important for you to follow up on all of your test results.  HOME CARE INSTRUCTIONS   Keep follow-up appointments with  your caregiver, or any specialist referral.   Only take over-the-counter or prescription medicines for pain, discomfort, or fever as directed by your caregiver.   Biofeedback, massage, or other relaxation techniques may be helpful.   Ice packs or heat applied to the head and neck can be used. Do this three to four times per day, or as needed.   Call your doctor if you have any questions or concerns.   If you smoke, you should quit.  SEEK MEDICAL CARE IF:   You develop problems with medications prescribed.   You do not respond to or obtain relief from medications.   You have a change from the usual headache.   You develop nausea or vomiting.  SEEK IMMEDIATE MEDICAL CARE IF:   If your headache becomes severe.   You have an unexplained oral temperature above 102 F (38.9 C), or as your caregiver suggests.   You have a stiff neck.   You have loss of vision.   You have muscular weakness.   You have loss of muscular control.   You develop severe symptoms different from your first symptoms.   You start losing your balance or have trouble walking.   You feel faint or pass out.  MAKE SURE YOU:   Understand these instructions.   Will watch your condition.   Will get help right away if you are not doing well or get worse.  Document Released: 03/12/2005 Document Revised: 03/01/2011 Document Reviewed: 10/30/2007 ExitCare Patient Information 2012 [employer]. 

## 2011-08-22 NOTE — ED Notes (Signed)
Pt sts she has been out of diltiazem for about 1 week.

## 2011-08-22 NOTE — Patient Instructions (Signed)
We will contact you with today's Pap smear. If it does not show any endocervical cells then please return to Dr. Ambrose Mantle to arrange for a cold knife conization her graft    You we'll be going to the emergency room immediately for further evaluation of the headache.

## 2011-08-22 NOTE — ED Notes (Signed)
Pt reports having pressure to whole head starting approx 30 minutes ago. States same thing occurred 3 weeks ago. Pt reports seeing black spots. Denies N/V, photophobia or phonophobia.Pt is A/O x4. Skin warm and dry. Respirations even and unlabored. NAD noted at this time.

## 2011-08-22 NOTE — ED Provider Notes (Signed)
History    51yF with HA. Onset around 1300 today while in waiting room of doctor's office. Gradual onset. Diffuse. Pressure like. No appreciable exacerbating or relieving factors. No n/v. Seeing some floaters but this chronic and not acute change. No change in visual acuity. No neck pain or stiffness. Denies trauma. Denies use of blood thinning medications. Denies significant HA hx but did have a similar HA about a month ago which resolved after a nap. No numbness, tingling or loss of strength. Denies use of blood thinning medication.  CSN: [id number]  Arrival date & time 08/22/11  1359   First MD Initiated Contact with Patient 08/22/11 1449      Chief Complaint  Patient presents with  . Headache    (Consider location/radiation/quality/duration/timing/severity/associated sxs/prior treatment) HPI  Past Medical History  Diagnosis Date  . Uterine fibroid   . Hyperlipidemia   . Hypertension   . Asthma   . OSA (obstructive sleep apnea)   . Arthritis   . Prolapsed uterus   . Breast cancer     Past Surgical History  Procedure Date  . Eye surgery 1961    right  . Breast surgery   . Mastectomy     Family History  Problem Relation Age of Onset  . Breast cancer Mother   . Colon cancer Mother   . Hypotension Mother   . Asthma Mother   . Diabetes type II Mother   . Arthritis Mother   . Mental illness Brother   . Heart disease Brother   . Cerebral palsy Daughter   . Emphysema Brother     never smoker    History  Substance Use Topics  . Smoking status: Former Smoker -- 0.3 packs/day for 10 years    Types: Cigarettes    Quit date: 03/26/1988  . Smokeless tobacco: Never Used  . Alcohol Use: No    OB History    Grav Para Term Preterm Abortions TAB SAB Ect Mult Living                  Review of Systems   Review of symptoms negative unless otherwise noted in HPI.   Allergies  Calcium carbonate antacid  Home Medications   Current Outpatient Rx  Name Route Sig  Dispense Refill  . ALBUTEROL SULFATE HFA 108 (90 BASE) MCG/ACT IN AERS Inhalation Inhale 2 puffs into the lungs every 4 (four) hours as needed. Shortness of breath    . ANASTROZOLE 1 MG PO TABS Oral Take 1 tablet (1 mg total) by mouth daily. 30 tablet 5  . DILTIAZEM HCL 60 MG PO TABS Oral Take 60 mg by mouth 2 (two) times daily.     Marland Kitchen FLUTICASONE PROPIONATE  HFA 44 MCG/ACT IN AERO Inhalation Inhale 2 puffs into the lungs 2 (two) times daily.     . FUROSEMIDE 40 MG PO TABS Oral Take 40 mg by mouth daily as needed. For fluid retention    . LORATADINE 10 MG PO TABS Oral Take 10 mg by mouth daily.       BP 160/93  Pulse 80  Temp(Src) 98.6 F (37 C) (Oral)  Resp 12  SpO2 100%  Physical Exam  Nursing note and vitals reviewed. Constitutional: She is oriented to person, place, and time. No distress.       Laying in bed. Nad. Obese.  HENT:  Head: Normocephalic and atraumatic.  Eyes: Conjunctivae are normal. Right eye exhibits no discharge. Left eye exhibits no discharge.  Neck:  Normal range of motion. Neck supple.  Cardiovascular: Normal rate, regular rhythm and normal heart sounds.  Exam reveals no gallop and no friction rub.   No murmur heard. Pulmonary/Chest: Effort normal and breath sounds normal. No respiratory distress.  Abdominal: Soft. She exhibits no distension. There is no tenderness.  Musculoskeletal: She exhibits no edema and no tenderness.  Lymphadenopathy:    She has no cervical adenopathy.  Neurological: She is alert and oriented to person, place, and time. No cranial nerve deficit. She exhibits normal muscle tone. Coordination normal.       Strength 5/5 b/l u/l ext. Good finger to nose b/l  Skin: Skin is warm and dry. She is not diaphoretic.  Psychiatric: She has a normal mood and affect. Her behavior is normal. Thought content normal.    ED Course  Procedures (including critical care time)  Labs Reviewed - No data to display No results found.   1. Headache   2.  Medication refill       MDM  51yf with diffuse HA for past 2 hours. Suspect primary HA. Consider emergent secondary causes such as bleed, infectious or mass but doubt. There is no history of trauma. Pt has a nonfocal neurological exam. Afebrile and neck supple. No use of blood thinning medication. Consider ocular etiology such as acute angle closure glaucoma but doubt. Pt denies acute change in visual acuity and eye exam unremarkable. . Doubt venous thrombosis. Doubt carotid or vertebral arteries dissection. Feel that can be safely discharged, but strict return precautions discussed. Outpt fu. Presciption for diltiazem provided on pt request since she is currently out.   Raeford Razor, MD 08/22/11 1531

## 2011-08-29 ENCOUNTER — Telehealth: Payer: Self-pay | Admitting: Pulmonary Disease

## 2011-08-29 DIAGNOSIS — R109 Unspecified abdominal pain: Secondary | ICD-10-CM | POA: Diagnosis not present

## 2011-08-29 DIAGNOSIS — E785 Hyperlipidemia, unspecified: Secondary | ICD-10-CM | POA: Diagnosis not present

## 2011-08-29 DIAGNOSIS — I10 Essential (primary) hypertension: Secondary | ICD-10-CM | POA: Diagnosis not present

## 2011-08-29 DIAGNOSIS — E559 Vitamin D deficiency, unspecified: Secondary | ICD-10-CM | POA: Diagnosis not present

## 2011-08-29 NOTE — Telephone Encounter (Signed)
Spoke with patient-she states she has not used her CPAP in about 2 weeks due to pressure and getting use to the face mask. Pt would like to have KC advise if he can do a test to check pressure or what advice he has. Pt understands she was told by [facility] to use the CPAP prior to returning to see him for follow up.

## 2011-08-29 NOTE — Telephone Encounter (Signed)
She needs to be desensitized: Wear cpap during day if possible at a time while watching tv or reading.  Do this twice if possible.  If works during day, can do around 8pm each night to get used to mask and pressure Make sure she is using ramp, and has it set to ramp up at least 30-75min.  She can hit the ramp button during the night if she awakens and feels pressure is too high.   I suspect her pressure needs may be higher than what she is on now.  Does she have a f/u upcoming with me?

## 2011-08-30 NOTE — Telephone Encounter (Signed)
Spoke with pt and notified of recs per [facility]. She verbalized understanding and states no questions or further needs. OV with KC for 09-14-11

## 2011-09-07 ENCOUNTER — Telehealth: Payer: Self-pay | Admitting: *Deleted

## 2011-09-11 ENCOUNTER — Telehealth: Payer: Self-pay | Admitting: Gynecologic Oncology

## 2011-09-11 NOTE — Telephone Encounter (Signed)
Message left for patient with pap smear results: negative.  Instructed to call for any questions or concerns.  

## 2011-09-14 ENCOUNTER — Ambulatory Visit: Payer: Medicare Other | Admitting: Pulmonary Disease

## 2011-09-24 ENCOUNTER — Ambulatory Visit (INDEPENDENT_AMBULATORY_CARE_PROVIDER_SITE_OTHER): Payer: Medicare Other | Admitting: Pulmonary Disease

## 2011-09-24 ENCOUNTER — Encounter: Payer: Self-pay | Admitting: Pulmonary Disease

## 2011-09-24 VITALS — BP 138/78 | HR 67 | Temp 98.0°F | Ht 67.0 in | Wt 268.0 lb

## 2011-09-24 DIAGNOSIS — G4733 Obstructive sleep apnea (adult) (pediatric): Secondary | ICD-10-CM

## 2011-09-24 NOTE — Patient Instructions (Addendum)
Will have your dme show you how to adjust the heater so that you can make air cooler. Will have them also put your machine on the automatic pressure setting so that it can self adjust itself.  I suspect this will help with tolerance. Wear cpap everynight as much as possible, and can use during day for 20-43min to help your get used to the machine over time. Work on weight loss. followup with me in 6mos if doing well.

## 2011-09-24 NOTE — Assessment & Plan Note (Signed)
The patient is trying to wear CPAP, but is having issues because of the temperature of her heated humidifier.  I will have her DME show her how to make these adjustments.  She was initially having mask issues, but these are improving with desensitization techniques.  At this point, we need to try and optimize her pressure, and I suspect she will do better on the automatic setting.  I have also encouraged her to work aggressively on weight loss.

## 2011-09-24 NOTE — Progress Notes (Signed)
  Subjective:    Patient ID: Michaela Little, female    DOB: [birth year], 51 y.o.   MRN: [id number]  HPI The patient is in today for followup of her known obstructive sleep apnea.  She was started on CPAP in January, and asked to followup in 6 weeks.  She has not returned since that time.  She is trying to wear her CPAP, and gets no more than a few hours at night at best, and feels the air temperature is too high.  She is unaware of how to adjust her heated humidifier.  She initially had mask tolerance issues, but has been trying to work on desensitization during the day.  It should be noted that we have yet to optimize her pressure because of her noncompliance with followup visits.  She has yet to see a big difference in her sleep or daytime alertness, but I suspect this is because of her compliance issues and also the fact that she is not on optimal pressure.   Review of Systems  Constitutional: Negative for fever and unexpected weight change.  HENT: Positive for congestion and postnasal drip. Negative for ear pain, nosebleeds, sore throat, rhinorrhea, sneezing, trouble swallowing, dental problem and sinus pressure.   Eyes: Negative for redness and itching.  Respiratory: Positive for shortness of breath. Negative for cough, chest tightness and wheezing.   Cardiovascular: Positive for leg swelling. Negative for palpitations.  Gastrointestinal: Negative for nausea and vomiting.  Genitourinary: Negative for dysuria.  Musculoskeletal: Negative for joint swelling.  Skin: Negative for rash.  Neurological: Positive for headaches.  Hematological: Does not bruise/bleed easily.  Psychiatric/Behavioral: Negative for dysphoric mood. The patient is nervous/anxious.   All other systems reviewed and are negative.       Objective:   Physical Exam Obese female in no acute distress Nose without purulence or discharge noted No skin breakdown or pressure necrosis from the CPAP mask Lower extremities with mild  edema, no cyanosis Alert and oriented, does not appear overly sleepy, moves all 4 extremities.       Assessment & Plan:

## 2011-09-28 ENCOUNTER — Other Ambulatory Visit: Payer: Self-pay | Admitting: Obstetrics and Gynecology

## 2011-09-28 DIAGNOSIS — I10 Essential (primary) hypertension: Secondary | ICD-10-CM | POA: Diagnosis not present

## 2011-09-28 DIAGNOSIS — E785 Hyperlipidemia, unspecified: Secondary | ICD-10-CM | POA: Diagnosis not present

## 2011-09-28 DIAGNOSIS — R7989 Other specified abnormal findings of blood chemistry: Secondary | ICD-10-CM | POA: Diagnosis not present

## 2011-09-28 DIAGNOSIS — E559 Vitamin D deficiency, unspecified: Secondary | ICD-10-CM | POA: Diagnosis not present

## 2011-10-02 DIAGNOSIS — E559 Vitamin D deficiency, unspecified: Secondary | ICD-10-CM | POA: Diagnosis not present

## 2011-10-02 DIAGNOSIS — E785 Hyperlipidemia, unspecified: Secondary | ICD-10-CM | POA: Diagnosis not present

## 2011-10-02 DIAGNOSIS — N879 Dysplasia of cervix uteri, unspecified: Secondary | ICD-10-CM | POA: Diagnosis not present

## 2011-10-02 DIAGNOSIS — I10 Essential (primary) hypertension: Secondary | ICD-10-CM | POA: Diagnosis not present

## 2011-10-10 ENCOUNTER — Encounter (HOSPITAL_COMMUNITY): Payer: Self-pay | Admitting: Pharmacist

## 2011-10-11 ENCOUNTER — Other Ambulatory Visit: Payer: Self-pay

## 2011-10-11 ENCOUNTER — Encounter (HOSPITAL_COMMUNITY)
Admission: RE | Admit: 2011-10-11 | Discharge: 2011-10-11 | Disposition: A | Discharge status: Home / Self Care | Payer: Medicare Other | Source: Ambulatory Visit | Attending: Obstetrics and Gynecology | Admitting: Obstetrics and Gynecology

## 2011-10-11 ENCOUNTER — Encounter (HOSPITAL_COMMUNITY): Payer: Self-pay

## 2011-10-11 DIAGNOSIS — N882 Stricture and stenosis of cervix uteri: Secondary | ICD-10-CM | POA: Diagnosis not present

## 2011-10-11 DIAGNOSIS — Z01812 Encounter for preprocedural laboratory examination: Secondary | ICD-10-CM | POA: Diagnosis not present

## 2011-10-11 DIAGNOSIS — N879 Dysplasia of cervix uteri, unspecified: Secondary | ICD-10-CM | POA: Diagnosis not present

## 2011-10-11 HISTORY — DX: Anxiety disorder, unspecified: F41.9

## 2011-10-11 HISTORY — DX: Other seasonal allergic rhinitis: J30.2

## 2011-10-11 HISTORY — DX: Headache: R51

## 2011-10-11 LAB — CBC WITH DIFFERENTIAL/PLATELET
Basophils Absolute: 0 10*3/uL (ref 0.0–0.1)
Basophils Relative: 0 % (ref 0–1)
Lymphocytes Relative: 31 % (ref 12–46)
MCHC: 32.4 g/dL (ref 30.0–36.0)
Monocytes Absolute: 0.4 10*3/uL (ref 0.1–1.0)
Neutro Abs: 3.1 10*3/uL (ref 1.7–7.7)
Neutrophils Relative %: 58 % (ref 43–77)
Platelets: 243 10*3/uL (ref 150–400)
RDW: 14.5 % (ref 11.5–15.5)
WBC: 5.3 10*3/uL (ref 4.0–10.5)

## 2011-10-11 LAB — URINALYSIS, ROUTINE W REFLEX MICROSCOPIC
Bilirubin Urine: NEGATIVE
Glucose, UA: NEGATIVE mg/dL
Hgb urine dipstick: NEGATIVE
Protein, ur: NEGATIVE mg/dL

## 2011-10-11 LAB — COMPREHENSIVE METABOLIC PANEL
ALT: 8 U/L (ref 0–35)
AST: 16 U/L (ref 0–37)
Albumin: 3.8 g/dL (ref 3.5–5.2)
Chloride: 102 mEq/L (ref 96–112)
Creatinine, Ser: 0.73 mg/dL (ref 0.50–1.10)
Potassium: 4.3 mEq/L (ref 3.5–5.1)
Sodium: 137 mEq/L (ref 135–145)
Total Bilirubin: 0.7 mg/dL (ref 0.3–1.2)

## 2011-10-11 NOTE — Pre-Procedure Instructions (Signed)
Patient requested to see anesthesia.  Dr Cristela Blue saw patient at PAT appt.

## 2011-10-11 NOTE — Patient Instructions (Addendum)
   Your procedure is scheduled on: Monday, July 22  Enter through the Main Entrance of [facility] at: 6 am Pick up the phone at the desk and dial [phone] and inform us of your arrival.  Please call this number if you have any problems the morning of surgery: [phone]  Remember: Do not eat food after midnight: Sunday Do not drink clear liquids after: Sunday Take these medicines the morning of surgery with a SIP OF WATER: diltiazen, anastrozole. Patient to bring albuterol inhaler with her on day of surgery.  Do not wear jewelry, make-up, or FINGER nail polish No metal in your hair or on your body. Do not wear lotions, powders, perfumes or deodorant. Do not shave 48 hours prior to surgery. Do not bring valuables to the hospital. Contacts, dentures or bridgework may not be worn into surgery.  Patients discharged on the day of surgery will not be allowed to drive home.  Home with friend/relative to be arranged prior to surgery   Remember to use your hibiclens as instructed.Please shower with 1/2 bottle the evening before your surgery and the other 1/2 bottle the morning of surgery. Neck down avoiding private area.

## 2011-10-13 ENCOUNTER — Other Ambulatory Visit: Payer: Self-pay | Admitting: Obstetrics and Gynecology

## 2011-10-13 NOTE — H&P (Signed)
Michaela Little, Michaela Little               ACCOUNT NO.:  [id number]  MEDICAL RECORD NO.:  [id number]  LOCATION:  [facility]                           FACILITY:  [facility]  PHYSICIAN:  Malachi Pro. Ambrose Mantle, M.D. DATE OF BIRTH:  Mar 07, 1961  DATE OF ADMISSION:  10/11/2011 DATE OF DISCHARGE:  10/11/2011                             HISTORY & PHYSICAL   HISTORY OF PRESENT ILLNESS:  This is a 51 year old black female, para 5- 0-5-5 had her last period in 2011.  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  PAST MEDICAL HISTORY:  Allergy to Tums gave her hives.  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 No dysplasia was found in the exocervix.  She did have breast cancer in 2010.  She has a history of asthma, glucose intolerance, hyperlipidemia, hypertension, obesity, osteoarthritis, and obstructive sleep apnea.  SURGICAL HISTORY:  She had a mastectomy in 2010 of the left breast, hemorrhoid surgery in 1982, eye surgery in 1961, and conization of the cervix in the last year.  MEDICATIONS:  Albuterol sulfate inhalation, anastrazole oral tablet 1 mg, calcium 500 mg, Claritin, cyclobenzaprine, diltiazem, Flovent HFA inhalation, hydrocodone/acetaminophen orally, oral Lasix, and vitamin D3.  FAMILY HISTORY:  Her mother had breast cancer.  Daughter has cerebral palsy.  Mother had colon cancer.  Brother with emphysema, brother with heart disease, and brother with mental disorders.  The patient has a tenth grade education.  SOCIAL HISTORY:  She denies alcohol, tobacco, and substance abuse.  She is disabled.  She is single.  PHYSICAL EXAMINATION:  GENERAL:  On admission, this is an obese black female in no distress.  She is well developed. HEENT:  Normal. VITAL SIGNS:  Blood pressure is 110/68, pulse is  80. NECK:  Supple without thyromegaly. HEART:  Normal size and sounds.  No murmurs. LUNGS:  Clear to auscultation. BREASTS:  Soft on the right with no masses.  The left breast has a prosthesis.  ABDOMEN:  Obese.  No masses are palpable.  Liver, spleen, and kidneys are not felt. GU:  External genitalia are normal.  There is a normal vagina with some prolapse of the vaginal walls making visualization of the cervix difficult.  The cervix is distorted.  There  does not seem to be a significant amount of cervix on the right side of her cervix.  The os cannot be found.  The uterus feels mid plane and normal size.  Adnexa free of masses.  ADMITTING IMPRESSION:  Dysplasia of the endocervix at the time of conization, cervical stenosis preventing access to the endocervical mucosa.  The patient was seen in consultation by Dr. Serita Kyle who advised if this Pap smear did not show endocervical cells that she should have a repeat conization.  The Pap smear did not show any endocervical cells.  It was a normal Pap smear, but was absent endocervical cells so that we are proceeding to dilation and curettage conization.  The patient has been informed of the risks of surgery including, but not  limited to injury to surrounding structures, hemorrhage either during or after surgery, infection, perforation of the uterus.  She understands and agrees to proceed.     Malachi Pro. Ambrose Mantle, M.D.     TFH/MEDQ  D:  10/13/2011  T:  10/13/2011  Job:  [id number]

## 2011-10-15 ENCOUNTER — Ambulatory Visit (HOSPITAL_COMMUNITY)
Admission: RE | Admit: 2011-10-15 | Discharge: 2011-10-15 | Disposition: A | Discharge status: Home / Self Care | Payer: Medicare Other | Source: Ambulatory Visit | Attending: Obstetrics and Gynecology | Admitting: Obstetrics and Gynecology

## 2011-10-15 ENCOUNTER — Encounter (HOSPITAL_COMMUNITY): Payer: Self-pay | Admitting: Anesthesiology

## 2011-10-15 ENCOUNTER — Encounter (HOSPITAL_COMMUNITY): Payer: Self-pay | Admitting: *Deleted

## 2011-10-15 ENCOUNTER — Inpatient Hospital Stay (HOSPITAL_COMMUNITY): Payer: Medicare Other | Admitting: Anesthesiology

## 2011-10-15 ENCOUNTER — Encounter (HOSPITAL_COMMUNITY)
Admission: RE | Disposition: A | Discharge status: Home / Self Care | Payer: Self-pay | Source: Ambulatory Visit | Attending: Obstetrics and Gynecology

## 2011-10-15 DIAGNOSIS — N870 Mild cervical dysplasia: Secondary | ICD-10-CM

## 2011-10-15 DIAGNOSIS — G4733 Obstructive sleep apnea (adult) (pediatric): Secondary | ICD-10-CM

## 2011-10-15 DIAGNOSIS — Z853 Personal history of malignant neoplasm of breast: Secondary | ICD-10-CM

## 2011-10-15 DIAGNOSIS — Z01812 Encounter for preprocedural laboratory examination: Secondary | ICD-10-CM | POA: Insufficient documentation

## 2011-10-15 DIAGNOSIS — I10 Essential (primary) hypertension: Secondary | ICD-10-CM

## 2011-10-15 DIAGNOSIS — I5189 Other ill-defined heart diseases: Secondary | ICD-10-CM

## 2011-10-15 DIAGNOSIS — N882 Stricture and stenosis of cervix uteri: Secondary | ICD-10-CM | POA: Insufficient documentation

## 2011-10-15 DIAGNOSIS — E669 Obesity, unspecified: Secondary | ICD-10-CM

## 2011-10-15 DIAGNOSIS — C50919 Malignant neoplasm of unspecified site of unspecified female breast: Secondary | ICD-10-CM | POA: Diagnosis not present

## 2011-10-15 DIAGNOSIS — G4730 Sleep apnea, unspecified: Secondary | ICD-10-CM | POA: Diagnosis not present

## 2011-10-15 DIAGNOSIS — C50912 Malignant neoplasm of unspecified site of left female breast: Secondary | ICD-10-CM

## 2011-10-15 DIAGNOSIS — N879 Dysplasia of cervix uteri, unspecified: Secondary | ICD-10-CM | POA: Insufficient documentation

## 2011-10-15 DIAGNOSIS — G4710 Hypersomnia, unspecified: Secondary | ICD-10-CM | POA: Diagnosis not present

## 2011-10-15 DIAGNOSIS — R0602 Shortness of breath: Secondary | ICD-10-CM | POA: Diagnosis not present

## 2011-10-15 HISTORY — PX: DILATION AND CURETTAGE OF UTERUS: SHX78

## 2011-10-15 SURGERY — DILATION AND CURETTAGE
Anesthesia: Monitor Anesthesia Care | Site: Vagina | Wound class: Clean Contaminated

## 2011-10-15 MED ORDER — KETOROLAC TROMETHAMINE 30 MG/ML IJ SOLN
INTRAMUSCULAR | Status: DC | PRN
  Administered 2011-10-15: 30 mg via INTRAVENOUS

## 2011-10-15 MED ORDER — METOCLOPRAMIDE HCL 5 MG/ML IJ SOLN: 10.0000 mg | Freq: Once | INTRAMUSCULAR | Status: DC | PRN

## 2011-10-15 MED ORDER — MIDAZOLAM HCL 5 MG/5ML IJ SOLN
INTRAMUSCULAR | Status: DC | PRN
  Administered 2011-10-15 (×2): 1 mg via INTRAVENOUS

## 2011-10-15 MED ORDER — DEXAMETHASONE SODIUM PHOSPHATE 4 MG/ML IJ SOLN
INTRAMUSCULAR | Status: DC | PRN
  Administered 2011-10-15: 10 mg via INTRAVENOUS

## 2011-10-15 MED ORDER — PROPOFOL 10 MG/ML IV EMUL
INTRAVENOUS | Status: DC | PRN
  Administered 2011-10-15: 75 ug/kg/min via INTRAVENOUS

## 2011-10-15 MED ORDER — SODIUM CHLORIDE 0.9 % IJ SOLN
INTRAMUSCULAR | Status: DC | PRN
  Administered 2011-10-15: 120 mL via INTRAVENOUS

## 2011-10-15 MED ORDER — LIDOCAINE HCL 0.5 % IJ SOLN
INTRAMUSCULAR | Status: AC
  Filled 2011-10-15: qty 1

## 2011-10-15 MED ORDER — GLYCOPYRROLATE 0.2 MG/ML IJ SOLN
INTRAMUSCULAR | Status: AC
  Filled 2011-10-15: qty 1

## 2011-10-15 MED ORDER — VASOPRESSIN 20 UNIT/ML IJ SOLN
INTRAMUSCULAR | Status: AC
  Filled 2011-10-15: qty 1

## 2011-10-15 MED ORDER — FENTANYL CITRATE 0.05 MG/ML IJ SOLN: 25.0000 ug | INTRAMUSCULAR | Status: DC | PRN

## 2011-10-15 MED ORDER — FENTANYL CITRATE 0.05 MG/ML IJ SOLN
INTRAMUSCULAR | Status: AC
  Filled 2011-10-15: qty 2

## 2011-10-15 MED ORDER — MIDAZOLAM HCL 2 MG/2ML IJ SOLN
INTRAMUSCULAR | Status: AC
  Filled 2011-10-15: qty 2

## 2011-10-15 MED ORDER — VASOPRESSIN 20 UNIT/ML IJ SOLN
INTRAMUSCULAR | Status: DC | PRN
  Administered 2011-10-15: 10 [IU]

## 2011-10-15 MED ORDER — MEPERIDINE HCL 25 MG/ML IJ SOLN: 6.2500 mg | INTRAMUSCULAR | Status: DC | PRN

## 2011-10-15 MED ORDER — DEXAMETHASONE SODIUM PHOSPHATE 10 MG/ML IJ SOLN
INTRAMUSCULAR | Status: AC
  Filled 2011-10-15: qty 1

## 2011-10-15 MED ORDER — ONDANSETRON HCL 4 MG/2ML IJ SOLN
INTRAMUSCULAR | Status: DC | PRN
  Administered 2011-10-15: 4 mg via INTRAVENOUS

## 2011-10-15 MED ORDER — LACTATED RINGERS IV SOLN
INTRAVENOUS | Status: DC
  Administered 2011-10-15: 125 mL/h via INTRAVENOUS

## 2011-10-15 MED ORDER — BUPIVACAINE IN DEXTROSE 0.75-8.25 % IT SOLN
INTRATHECAL | Status: DC | PRN
  Administered 2011-10-15: 1.3 mL via INTRATHECAL

## 2011-10-15 MED ORDER — ONDANSETRON HCL 4 MG/2ML IJ SOLN
INTRAMUSCULAR | Status: AC
  Filled 2011-10-15: qty 2

## 2011-10-15 MED ORDER — PROPOFOL 10 MG/ML IV EMUL
INTRAVENOUS | Status: AC
  Filled 2011-10-15: qty 20

## 2011-10-15 MED ORDER — FENTANYL CITRATE 0.05 MG/ML IJ SOLN
INTRAMUSCULAR | Status: DC | PRN
  Administered 2011-10-15 (×2): 50 ug via INTRAVENOUS

## 2011-10-15 MED ORDER — LIDOCAINE HCL (CARDIAC) 20 MG/ML IV SOLN
INTRAVENOUS | Status: AC
  Filled 2011-10-15: qty 5

## 2011-10-15 SURGICAL SUPPLY — 13 items
CATH ROBINSON RED A/P 16FR (CATHETERS) ×2 IMPLANT
CLOTH BEACON ORANGE TIMEOUT ST (SAFETY) ×2 IMPLANT
CONTAINER PREFILL 10% NBF 60ML (FORM) ×4 IMPLANT
GLOVE BIO SURGEON STRL SZ7.5 (GLOVE) ×4 IMPLANT
GOWN PREVENTION PLUS LG XLONG (DISPOSABLE) ×2 IMPLANT
GOWN PREVENTION PLUS XLARGE (GOWN DISPOSABLE) ×2 IMPLANT
NEEDLE SPNL 22GX3.5 QUINCKE BK (NEEDLE) ×2 IMPLANT
PACK VAGINAL MINOR WOMEN LF (CUSTOM PROCEDURE TRAY) ×2 IMPLANT
PAD PREP 24X48 CUFFED NSTRL (MISCELLANEOUS) ×2 IMPLANT
SUT CHROMIC 0 CT 1 (SUTURE) ×4 IMPLANT
SYR CONTROL 10ML LL (SYRINGE) ×2 IMPLANT
TOWEL OR 17X24 6PK STRL BLUE (TOWEL DISPOSABLE) ×4 IMPLANT
WATER STERILE IRR 1000ML POUR (IV SOLUTION) IMPLANT

## 2011-10-15 NOTE — Anesthesia Preprocedure Evaluation (Addendum)
Anesthesia Evaluation  Patient identified by MRN, date of birth, ID band Patient awake    Reviewed: Allergy & Precautions, H&P , NPO status , Patient's Chart, lab work & pertinent test results  Airway Mallampati: III TM Distance: >3 FB Neck ROM: Full    Dental No notable dental hx. (+) Teeth Intact   Pulmonary shortness of breath, with exertion and lying, asthma , sleep apnea and Continuous Positive Airway Pressure Ventilation ,  breath sounds clear to auscultation  Pulmonary exam normal       Cardiovascular hypertension, Pt. on medications Rhythm:Regular Rate:Normal     Neuro/Psych  Headaches, Anxiety    GI/Hepatic negative GI ROS, Neg liver ROS,   Endo/Other  Morbid obesity  Renal/GU negative Renal ROS  negative genitourinary   Musculoskeletal negative musculoskeletal ROS (+)   Abdominal Normal abdominal exam  (+) + obese,   Peds  Hematology negative hematology ROS (+)   Anesthesia Other Findings   Reproductive/Obstetrics Uterine Fibroids                          Anesthesia Physical Anesthesia Plan  ASA: III  Anesthesia Plan: Spinal   Post-op Pain Management:    Induction: Intravenous  Airway Management Planned: Natural Airway  Additional Equipment:   Intra-op Plan:   Post-operative Plan: Extubation in OR  Informed Consent: I have reviewed the patients History and Physical, chart, labs and discussed the procedure including the risks, benefits and alternatives for the proposed anesthesia with the patient or authorized representative who has indicated his/her understanding and acceptance.   Dental advisory given  Plan Discussed with: Anesthesiologist, CRNA and Surgeon  Anesthesia Plan Comments:       Anesthesia Quick Evaluation

## 2011-10-15 NOTE — Anesthesia Procedure Notes (Signed)
Spinal  Patient location during procedure: OR Start time: 10/15/2011 7:54 AM Staffing Anesthesiologist: Brayton Caves R Performed by: anesthesiologist  Preanesthetic Checklist Completed: patient identified, site marked, surgical consent, pre-op evaluation, timeout performed, IV checked, risks and benefits discussed and monitors and equipment checked Spinal Block Patient position: sitting Prep: DuraPrep Patient monitoring: heart rate, cardiac monitor, continuous pulse ox and blood pressure Approach: midline Location: L3-4 Injection technique: single-shot Needle Needle type: Sprotte  Needle gauge: 24 G Needle length: 9 cm Assessment Sensory level: T4 Additional Notes Patient identified.  Risk benefits discussed including failed block, incomplete pain control, headache, nerve damage, paralysis, blood pressure changes, nausea, vomiting, reactions to medication both toxic or allergic, and postpartum back pain.  Patient expressed understanding and wished to proceed.  All questions were answered.  Sterile technique used throughout procedure.  CSF was clear.  No parasthesia or other complications.  Please see nursing notes for vital signs.

## 2011-10-15 NOTE — Progress Notes (Signed)
I spoke with Michaela Little in Care Management via telephone about transportation and after care for [name].  She informed me that she has informed social work of our issues and that Research scientist (physical sciences) from social work will be coming to visit with patient to make proper arrangements for her.  It was agreed that if no one from social work has come by to see patient within 20 min to call Michaela Little back for follow up.

## 2011-10-15 NOTE — Op Note (Signed)
NAME:  Michaela Little, Michaela Little               ACCOUNT NO.:  [id number]  MEDICAL RECORD NO.:  [id number]  LOCATION:  [facility]                          [facility]ACILITY:  [facility]  PHYSICIAN:  Malachi Pro. Ambrose Mantle, M.D. DATE OF BIRTH:  26-Aug-1960  DATE OF PROCEDURE:  10/15/2011 DATE OF DISCHARGE:                              OPERATIVE REPORT   PREOPERATIVE DIAGNOSIS:  Dysplasia of the endocervical curettings on a prior conization with current cervical stenosis.  POSTOPERATIVE DIAGNOSIS:  Dysplasia of the endocervical curettings on a prior conization with current cervical stenosis.  OPERATION:  Conization of the cervix and endocervical curettings.  OPERATOR:  Malachi Pro. Ambrose Mantle, MD.  Spinal anesthesia.  The patient was brought to the operating room and given a spinal anesthetic by Dr. Brayton Caves.  She was placed in lithotomy position.  The vulva, vagina, perineum, and urethra were prepped with Betadine solution.  The bladder was emptied with a [place] catheter.  A time-out was done.  The area was then draped as a sterile field.  Exam revealed the cervix to be markedly distorted.  There was very little cervical tissue on the right side of her cervix.  The cervix was completely stenotic so that would not admit a small dilator.  I got the lacrimal duct probe after I draped the area as a sterile field.  I grasped the cervix as well as I could determine the cervical anatomy at the 12 and 6 o'clock positions, and using a lacrimal duct probe, I was able to create a cervical canal.  It sounded to 6 cm.  I then did a conization after I injected the cervix with a dilute solution of Pitressin.  I used about 10 mL of a 1/10 units of Pitressin and 120 mL of saline.  I did a small cone all the way around the cervical os and then did an endocervical curetting, tried to recover all the endocervical tissue.  I then reapproximated the cervix with 4 interrupted sutures of 0 chromic from something like 5 o'clock to 7 o'clock, 8  o'clock to 10 o'clock, 11 o'clock to 1 o'clock, and 2 o'clock to 4 o'clock.  I sounded the cervix at the end of the procedure.  It was patent.  I did a rectal exam.  The rectum was completely full of stool, but I do not think there was any rectal injury even though the anatomy was very difficult.  The patient seemed to tolerate the procedure well. She had no pain during the procedure and the procedure was terminated. There was a social situation with the patient.  She apparently does not have anyone from her family that can take her home and be with her. Anesthesia staff was concerned about this.  I talked to nursing administration.  They felt like they could arrange the proper postop care for the patient with consult from social work and with the nursing staff.  Blood loss about 5 mL.     Malachi Pro. Ambrose Mantle, M.D.     TFH/MEDQ  D:  10/15/2011  T:  10/15/2011  Job:  [id number]

## 2011-10-15 NOTE — Transfer of Care (Signed)
Immediate Anesthesia Transfer of Care Note  Patient: Counselling psychologist  Procedure(s) Performed: Procedure(s) (LRB): DILATATION AND CURETTAGE (N/A)  Patient Location: PACU  Anesthesia Type: Spinal  Level of Consciousness: awake, alert  and oriented  Airway & Oxygen Therapy: Patient Spontanous Breathing  Post-op Assessment: Report given to PACU RN and Post -op Vital signs reviewed and stable  Post vital signs: Reviewed and stable  Complications: No apparent anesthesia complications

## 2011-10-15 NOTE — Progress Notes (Signed)
Patient ID: Michaela Little, female   DOB: [birth year], 51 y.o.   MRN: [id number] I examined this lady 10-11-11 and she reports no change in her health since that time.

## 2011-10-15 NOTE — Anesthesia Postprocedure Evaluation (Signed)
Anesthesia Post Note  Patient: Counselling psychologist  Procedure(s) Performed: Procedure(s) (LRB): DILATATION AND CURETTAGE (N/A)  Anesthesia type: Spinal  Patient location: PACU  Post pain: Pain level controlled  Post assessment: Post-op Vital signs reviewed  Last Vitals:  Filed Vitals:   10/15/11 0915  BP: 115/73  Pulse: 52  Temp:   Resp: 16    Post vital signs: Reviewed  Level of consciousness: awake  Complications: No apparent anesthesia complications

## 2011-10-16 ENCOUNTER — Encounter (HOSPITAL_COMMUNITY): Payer: Self-pay | Admitting: Obstetrics and Gynecology

## 2011-10-22 NOTE — Telephone Encounter (Signed)
error 

## 2011-10-23 ENCOUNTER — Encounter (HOSPITAL_COMMUNITY): Payer: Self-pay | Admitting: *Deleted

## 2011-10-23 ENCOUNTER — Emergency Department (HOSPITAL_COMMUNITY): Payer: Medicare Other

## 2011-10-23 ENCOUNTER — Emergency Department (HOSPITAL_COMMUNITY)
Admission: EM | Admit: 2011-10-23 | Discharge: 2011-10-24 | Disposition: A | Discharge status: Home / Self Care | Payer: Medicare Other | Attending: Emergency Medicine | Admitting: Emergency Medicine

## 2011-10-23 DIAGNOSIS — Z853 Personal history of malignant neoplasm of breast: Secondary | ICD-10-CM | POA: Insufficient documentation

## 2011-10-23 DIAGNOSIS — Z87891 Personal history of nicotine dependence: Secondary | ICD-10-CM | POA: Diagnosis not present

## 2011-10-23 DIAGNOSIS — J45909 Unspecified asthma, uncomplicated: Secondary | ICD-10-CM | POA: Diagnosis not present

## 2011-10-23 DIAGNOSIS — I10 Essential (primary) hypertension: Secondary | ICD-10-CM | POA: Diagnosis not present

## 2011-10-23 DIAGNOSIS — M79603 Pain in arm, unspecified: Secondary | ICD-10-CM

## 2011-10-23 DIAGNOSIS — T7840XA Allergy, unspecified, initial encounter: Secondary | ICD-10-CM

## 2011-10-23 DIAGNOSIS — R0602 Shortness of breath: Secondary | ICD-10-CM

## 2011-10-23 DIAGNOSIS — M79609 Pain in unspecified limb: Secondary | ICD-10-CM | POA: Diagnosis not present

## 2011-10-23 DIAGNOSIS — T6391XA Toxic effect of contact with unspecified venomous animal, accidental (unintentional), initial encounter: Secondary | ICD-10-CM | POA: Diagnosis not present

## 2011-10-23 DIAGNOSIS — E785 Hyperlipidemia, unspecified: Secondary | ICD-10-CM | POA: Insufficient documentation

## 2011-10-23 DIAGNOSIS — Y92009 Unspecified place in unspecified non-institutional (private) residence as the place of occurrence of the external cause: Secondary | ICD-10-CM | POA: Insufficient documentation

## 2011-10-23 DIAGNOSIS — Z79899 Other long term (current) drug therapy: Secondary | ICD-10-CM | POA: Diagnosis not present

## 2011-10-23 DIAGNOSIS — R0789 Other chest pain: Secondary | ICD-10-CM | POA: Insufficient documentation

## 2011-10-23 DIAGNOSIS — R079 Chest pain, unspecified: Secondary | ICD-10-CM | POA: Diagnosis not present

## 2011-10-23 DIAGNOSIS — J984 Other disorders of lung: Secondary | ICD-10-CM | POA: Diagnosis not present

## 2011-10-23 LAB — POCT I-STAT TROPONIN I: Troponin i, poc: 0 ng/mL (ref 0.00–0.08)

## 2011-10-23 MED ORDER — DIPHENHYDRAMINE HCL 50 MG/ML IJ SOLN
50.0000 mg | Freq: Once | INTRAMUSCULAR | Status: AC
  Administered 2011-10-24: 50 mg via INTRAVENOUS
  Filled 2011-10-23: qty 1

## 2011-10-23 MED ORDER — ALBUTEROL (5 MG/ML) CONTINUOUS INHALATION SOLN
10.0000 mg/h | INHALATION_SOLUTION | RESPIRATORY_TRACT | Status: AC
  Administered 2011-10-24: 10 mg/h via RESPIRATORY_TRACT
  Filled 2011-10-23: qty 20

## 2011-10-23 NOTE — ED Provider Notes (Signed)
History     CSN: [id number]  Arrival date & time 10/23/11  4098   First MD Initiated Contact with Patient 10/23/11 2341      Chief Complaint  Patient presents with  . Insect Bite    swelling at bite site  . Chest Pain  . Shortness of Breath    with ambulation    (Consider location/radiation/quality/duration/timing/severity/associated sxs/prior treatment) HPI Comments: Patient presents to the ED after being bit by bugs late last night while taking out her trash to the dumpster. She reports itching and swelling soon after the bug bites. She was bitten multiple places on both arms and two places on her right chest. This afternoon, she started experiencing some chest tightness and became short of breath that she is still experiencing which she attributed to the bug bites. She did not take anything for the itching or swelling. She admits to nausea, abdominal pain, pruritis of arms, SOB, chest tightness. She denies vomiting, diarrhea, fevers, chills.   Patient is a 51 y.o. female presenting with chest pain and shortness of breath.  Chest Pain Primary symptoms include shortness of breath, abdominal pain and nausea. Pertinent negatives for primary symptoms include no fever, no fatigue, no cough, no vomiting and no dizziness.  Pertinent negatives for associated symptoms include no diaphoresis.    Shortness of Breath  Associated symptoms include chest pain and shortness of breath. Pertinent negatives include no fever, no sore throat and no cough.    Past Medical History  Diagnosis Date  . Uterine fibroid   . Hyperlipidemia   . Hypertension   . Asthma   . OSA (obstructive sleep apnea)   . Prolapsed uterus   . SVD (spontaneous vaginal delivery)     x 5  . Breast cancer 11/2008  . Seasonal allergies   . Anxiety   . Shortness of breath     occasional  . Headache     otc med prn  . Arthritis     hands, knees    Past Surgical History  Procedure Date  . Eye surgery 1961    right    . Mastectomy 11/2008    done in [place]  . Breast surgery 11/2008     done in [place] - breast cancer  . Cervical conization w/bx     done in [place]  . Colonoscopy   . Dilation and curettage of uterus 10/15/2011    Procedure: DILATATION AND CURETTAGE;  Surgeon: Bing Plume, MD;  Location: [facility];  Service: Gynecology;  Laterality: N/A;  Conization    Family History  Problem Relation Age of Onset  . Breast cancer Mother   . Colon cancer Mother   . Hypotension Mother   . Asthma Mother   . Diabetes type II Mother   . Arthritis Mother   . Mental illness Brother   . Heart disease Brother   . Cerebral palsy Daughter   . Emphysema Brother     never smoker    History  Substance Use Topics  . Smoking status: Former Smoker -- 0.3 packs/day for 10 years    Types: Cigarettes    Quit date: 03/26/1988  . Smokeless tobacco: Never Used  . Alcohol Use: No    OB History    Grav Para Term Preterm Abortions TAB SAB Ect Mult Living                  Review of Systems  Constitutional: Negative for fever, chills, diaphoresis and fatigue.  HENT: Positive for postnasal drip. Negative for sore throat, facial swelling, trouble swallowing and sinus pressure.   Eyes: Negative for photophobia and visual disturbance.  Respiratory: Positive for chest tightness and shortness of breath. Negative for cough and choking.   Cardiovascular: Positive for chest pain.  Gastrointestinal: Positive for nausea and abdominal pain. Negative for vomiting and diarrhea.  Musculoskeletal: Positive for back pain and gait problem.  Skin: Positive for rash.       Swollen bug bites.   Neurological: Negative for dizziness, light-headedness and headaches.    Allergies  Calcium carbonate antacid  Home Medications   Current Outpatient Rx  Name Route Sig Dispense Refill  . ALBUTEROL SULFATE HFA 108 (90 BASE) MCG/ACT IN AERS Inhalation Inhale 2 puffs into the lungs every 4 (four) hours as needed. Shortness of breath    .  ANASTROZOLE 1 MG PO TABS Oral Take 1 tablet (1 mg total) by mouth daily. 30 tablet 5  . DIAZEPAM 10 MG PO TABS Oral Take 10 mg by mouth every 6 (six) hours as needed. For muscle spasms    . DILTIAZEM HCL 60 MG PO TABS Oral Take 1 tablet (60 mg total) by mouth 2 (two) times daily. 60 tablet 0  . FLUTICASONE PROPIONATE  HFA 44 MCG/ACT IN AERO Inhalation Inhale 2 puffs into the lungs 2 (two) times daily.     . FUROSEMIDE 40 MG PO TABS Oral Take 40 mg by mouth daily as needed. For fluid retention    . LORATADINE 10 MG PO TABS Oral Take 10 mg by mouth daily.     Marland Kitchen MONTELUKAST SODIUM 10 MG PO TABS Oral Take 10 mg by mouth at bedtime.    Marland Kitchen SIMVASTATIN 20 MG PO TABS Oral Take 20 mg by mouth every evening.    Marland Kitchen VITAMIN D (ERGOCALCIFEROL) 50000 UNITS PO CAPS Oral Take 50,000 Units by mouth every 7 (seven) days.      BP 156/100  Pulse 79  Temp 97.9 F (36.6 C) (Oral)  Resp 18  SpO2 100%  Physical Exam  Nursing note and vitals reviewed. Constitutional: She appears well-developed and well-nourished. No distress.  HENT:  Head: Normocephalic and atraumatic.  Mouth/Throat: Oropharynx is clear and moist. No oropharyngeal exudate.  Eyes: Conjunctivae are normal. Pupils are equal, round, and reactive to light. No scleral icterus.  Neck: Normal range of motion. Neck supple.  Cardiovascular: Normal rate and regular rhythm.  Exam reveals no gallop and no friction rub.   No murmur heard. Pulmonary/Chest: She has no rales. She exhibits no tenderness.       Faint wheezes heard throughout lung fields. Patient expresses chest pain when asked to take deep breaths. Two urticarial lesions above right breast. Previous left mastectomy.   Abdominal: Soft. Bowel sounds are normal. She exhibits no distension. There is tenderness. There is no rebound.       Patient reports generalized tenderness to palpation.   Musculoskeletal: She exhibits no tenderness.       Patient exhibits abnormal gait due to chronic back pain.    Lymphadenopathy:    She has no cervical adenopathy.  Neurological: She is alert.  Skin: Skin is warm and dry. She is not diaphoretic.       Several scattered urticarial lesions on bilateral upper extremities.   Psychiatric: She has a normal mood and affect. Her behavior is normal.    ED Course  Procedures (including critical care time)   Labs Reviewed  POCT I-STAT TROPONIN I  POCT PREGNANCY, URINE  POCT I-STAT TROPONIN I   Dg Chest 2 View  10/23/2011  *RADIOLOGY REPORT*  Clinical Data: Chest pain and shortness of breath.  CHEST - 2 VIEW  Comparison: 03/22/2011  Findings: Two views of the chest demonstrate a left breast implant. Surgical clips in the left axilla.  Again noted are slightly increased densities at the left lung base which may represent overlying structures.  Upper lungs are clear.  No evidence for airspace disease or edema.  Heart size is stable. Trachea is mildly deviated towards the right which is unchanged.  Negative for a pneumothorax.  No evidence for pleural effusions.  IMPRESSION: No acute chest findings.  Original Report Authenticated By: Richarda Overlie, M.D.    Date: 10/24/2011  Rate: 81  Rhythm: normal sinus rhythm  QRS Axis: normal  Intervals: normal  ST/T Wave abnormalities: nonspecific T wave changes  Conduction Disutrbances:none  Narrative Interpretation: normal sinus rhythm with nonspecific T wave abnormalities that are different from the old EKG  Old EKG Reviewed: changes noted, non specific T wave changes from previous EKG   Date: 10/24/2011  Rate: 93  Rhythm: normal sinus rhythm  QRS Axis: normal  Intervals: normal  ST/T Wave abnormalities: normal  Conduction Disutrbances:none  Narrative Interpretation: normal sinus rhythm   Old EKG Reviewed: resolution of nonspecific T wave changes on previous EKG     No diagnosis found.    MDM  12:10 AM Patient expresses chest tightness as well as pain and itching of her bug bites. The chest tightness is  most likely due to her history of asthma and does not sound cardiac in nature. I have ordered benadryl and an albuterol nebulizer. I will repeat an EKG and troponin to rule out cardiac etiology of chest tightness. I will check back with her to see if her symptoms are alleviated after the nebulizer treatment.   2:07 AM Patient complaining of left arm pain that she associates with the bug bites. She describes it as coming and going and "it just hurts." The pain is reproducible with active and passive ROM as well as palpation of the left arm. I ordered IV morphine for pain.   2:40 AM Patient reports some relief of left arm pain after morphine. She is resting comfortably and feeling tired.  Filed Vitals:   10/24/11 0215  BP: 169/95  Pulse: 99  Temp:   Resp: 20   3:07 AM Repeat troponin negative.   4:16 AM Repeat EKG shows resolution of nonspecific T wave changes. Along with a negative troponin, patient is not concerning for MI. Left arm pain is likely to be caused from bug bites. She appears to be having an allergic reaction to the bug bites but does not show signs of airway compromise. She will be discharged home with pain medication and benadryl and encouraged to return if symptoms progress or if her breathing is compromised. She should follow up with her PCP if symptoms do not resolve.   Emilia Beck, [place] 10/24/11 [phone]

## 2011-10-23 NOTE — ED Notes (Signed)
PT reports insect bites with swelling and chest pressure with ambulation . Pt reports Mother had a MI.

## 2011-10-23 NOTE — ED Notes (Signed)
PA at bedside.

## 2011-10-24 ENCOUNTER — Other Ambulatory Visit: Payer: Self-pay

## 2011-10-24 LAB — POCT I-STAT TROPONIN I: Troponin i, poc: 0 ng/mL (ref 0.00–0.08)

## 2011-10-24 MED ORDER — DIPHENHYDRAMINE HCL 25 MG PO TABS: 25.0000 mg | ORAL_TABLET | Freq: Four times a day (QID) | ORAL | Status: DC

## 2011-10-24 MED ORDER — OXYCODONE-ACETAMINOPHEN 5-325 MG PO TABS: 2.0000 | ORAL_TABLET | ORAL | Status: AC | PRN

## 2011-10-24 MED ORDER — MORPHINE SULFATE 4 MG/ML IJ SOLN
4.0000 mg | Freq: Once | INTRAMUSCULAR | Status: AC
  Administered 2011-10-24: 4 mg via INTRAVENOUS
  Filled 2011-10-24: qty 1

## 2011-10-24 NOTE — ED Provider Notes (Signed)
Medical screening examination/treatment/procedure(s) were conducted as a shared visit with non-physician practitioner(s) and myself.  I personally evaluated the patient during the encounter  Please see my separate respective documentation pertaining to this patient encounter   Vida Roller, MD 10/24/11 [phone]

## 2011-10-24 NOTE — ED Notes (Signed)
Pt walked to the restroom without assistance, pt returned and started back on breathing treatment

## 2011-10-24 NOTE — ED Notes (Signed)
51 year old female who states that she has a history of asthma presents with a complaint of shortness of breath with chest tightness and insect bites over her bilateral upper extremities. She states the insect bite occurred last night while she was taking the garbage out, she has had areas of redness and itching since that time. Nothing seems to make it better or worse, or shortness of breath and wheezing seems to be worse with exertion but is mild overall. She has had no medications prior to arrival  On my exam the patient is in no acute distress, she is able to ambulate the hallways without any respiratory distress. She has mild expiratory wheezing on exam but no increased work of breathing and no accessory muscle use. She has no significant peripheral edema though she is morbidly obese. She does have multiple areas of erythema with raised edges and which are pruritic and blanching consistent with urticaria scattered over her bilateral upper extremities  Assessment:  Check labs and chest x-ray to rule out other sources of shortness of breath though suspect asthma, Benadryl for itching as this appears to be local reactions or urticaria from insect bites.  Repeat EKG shows no signs of ischemia, no change and second troponin is negative as well. The patient is improved after medications including albuterol nebulizer and on reexamination has clear lungs without wheezing. She has been sleeping in no acute distress, has been given antihistamines for her local reactions and will be discharged with prednisone and antihistamines with pain medication for home. She is expressed her understanding for indications for followup. At this time I do not think that the pain in her chest is cardiac in nature but more likely related to reactive airway disease.  Medical screening examination/treatment/procedure(s) were conducted as a shared visit with non-physician practitioner(s) and myself.  I personally evaluated the patient  during the encounter    Vida Roller, MD 10/24/11 0330

## 2011-10-24 NOTE — ED Notes (Signed)
Pt states that she is feeling better after the pain medication

## 2011-10-25 DIAGNOSIS — R109 Unspecified abdominal pain: Secondary | ICD-10-CM | POA: Diagnosis not present

## 2011-10-25 DIAGNOSIS — R079 Chest pain, unspecified: Secondary | ICD-10-CM | POA: Diagnosis not present

## 2011-10-25 DIAGNOSIS — N879 Dysplasia of cervix uteri, unspecified: Secondary | ICD-10-CM | POA: Diagnosis not present

## 2011-10-25 DIAGNOSIS — I10 Essential (primary) hypertension: Secondary | ICD-10-CM | POA: Diagnosis not present

## 2011-10-26 ENCOUNTER — Ambulatory Visit (INDEPENDENT_AMBULATORY_CARE_PROVIDER_SITE_OTHER): Payer: Medicare Other | Admitting: Internal Medicine

## 2011-10-26 ENCOUNTER — Encounter: Payer: Self-pay | Admitting: Internal Medicine

## 2011-10-26 ENCOUNTER — Other Ambulatory Visit (HOSPITAL_COMMUNITY): Payer: Self-pay | Admitting: Internal Medicine

## 2011-10-26 VITALS — BP 120/68 | HR 80 | Ht 65.5 in | Wt 263.1 lb

## 2011-10-26 DIAGNOSIS — R109 Unspecified abdominal pain: Secondary | ICD-10-CM

## 2011-10-26 DIAGNOSIS — R0609 Other forms of dyspnea: Secondary | ICD-10-CM

## 2011-10-26 DIAGNOSIS — G8929 Other chronic pain: Secondary | ICD-10-CM

## 2011-10-26 DIAGNOSIS — R1011 Right upper quadrant pain: Secondary | ICD-10-CM | POA: Diagnosis not present

## 2011-10-26 MED ORDER — MELOXICAM 15 MG PO TABS: 15.0000 mg | ORAL_TABLET | Freq: Every day | ORAL | Status: DC

## 2011-10-26 MED ORDER — OMEPRAZOLE 20 MG PO CPDR: 20.0000 mg | DELAYED_RELEASE_CAPSULE | Freq: Every day | ORAL | Status: DC

## 2011-10-26 NOTE — Progress Notes (Signed)
Subjective:    Patient ID: Michaela Little, female    DOB: Jan 10, 1961, 51 y.o.   MRN: [id number] Referred by: Oneal Grout, MD  HPI This is a 51 year old disabled African American woman with numerous medical problems including chronic pain and history of breast cancer. She moved to [place] from [place] several years ago. She is here because of chronic right-sided abdominal pain. She has pain in the right upper quadrant and flank and into the back. It is there all the time. She has what she feels like a swelling in the right upper back area. The pain will wax and wane but never goes away. It seems to be most aggravated by movement. She is mildly constipated now but in general has been moving her bowels well without difficulty and notes no association of the symptoms with defecation or eating. She had a colonoscopy in [place] in 2011 that she reports as normal. She has seen gynecology and urology, she has been to the emergency department, has had imaging of multiple modalities without etiology of pain though she does have a fibroid uterus and cervical dysplasia and has had 2 cone surgery procedures for the dysplasia. She says her gynecologist has said that he is not convinced her fibroid uterus is causing pain though she does report lower, pain and the sensation that something is dropping it sometimes or that it will. She has a diffusely positive review of systems that does include excessive urination. Her GI review of systems is also positive for some heartburn, flatulence and gas, she has hemorrhoids but does not have bleeding, and she's had nausea at times. Allergies  Allergen Reactions  . Calcium Carbonate Antacid Hives    Fruit flavored Tums = hives   Outpatient Prescriptions Prior to Visit  Medication Sig Dispense Refill  . albuterol (PROVENTIL HFA;VENTOLIN HFA) 108 (90 BASE) MCG/ACT inhaler Inhale 2 puffs into the lungs every 4 (four) hours as needed. Shortness of breath      . anastrozole  (ARIMIDEX) 1 MG tablet Take 1 tablet (1 mg total) by mouth daily.  30 tablet  5  . diazepam (VALIUM) 10 MG tablet Take 10 mg by mouth every 6 (six) hours as needed. For muscle spasms      . diltiazem (CARDIZEM) 60 MG tablet Take 1 tablet (60 mg total) by mouth 2 (two) times daily.  60 tablet  0  . diphenhydrAMINE (BENADRYL) 25 MG tablet Take 1 tablet (25 mg total) by mouth every 6 (six) hours.  20 tablet  0  . fluticasone (FLOVENT HFA) 44 MCG/ACT inhaler Inhale 2 puffs into the lungs 2 (two) times daily.       . furosemide (LASIX) 40 MG tablet Take 40 mg by mouth daily as needed. For fluid retention      . loratadine (CLARITIN) 10 MG tablet Take 10 mg by mouth daily.       . montelukast (SINGULAIR) 10 MG tablet Take 10 mg by mouth at bedtime.      Marland Kitchen oxyCODONE-acetaminophen (PERCOCET/ROXICET) 5-325 MG per tablet Take 2 tablets by mouth every 4 (four) hours as needed for pain.  15 tablet  0  . simvastatin (ZOCOR) 20 MG tablet Take 20 mg by mouth every evening.      . Vitamin D, Ergocalciferol, (DRISDOL) 50000 UNITS CAPS Take 50,000 Units by mouth every 7 (seven) days.       Past Medical History  Diagnosis Date  . Uterine fibroid   . Hyperlipidemia   .  Hypertension   . Asthma   . OSA (obstructive sleep apnea)   . Prolapsed uterus   . SVD (spontaneous vaginal delivery)     x 5  . Breast cancer 11/2008    left  . Seasonal allergies   . Anxiety   . Shortness of breath     occasional  . Headache     otc med prn  . Arthritis     hands, knees  . History of pneumonia    Past Surgical History  Procedure Date  . Eye surgery 1961    right  . Mastectomy 11/2008    done in [place], left  . Breast surgery 11/2008     done in [place] - breast cancer  . Cervical conization w/bx     done in [place]  . Colonoscopy   . Dilation and curettage of uterus 10/15/2011    Procedure: DILATATION AND CURETTAGE;  Surgeon: Bing Plume, MD;  Location: [facility];  Service: Gynecology;  Laterality: N/A;  Conization    History   Social History  . Marital Status: Single    Spouse Name: N/A    Number of Children: 5  . Years of Education: N/A   Occupational History  . Disabled    Social History Main Topics  . Smoking status: Former Smoker -- 0.3 packs/day for 10 years    Types: Cigarettes    Quit date: 03/26/1988  . Smokeless tobacco: Never Used  . Alcohol Use: No  . Drug Use: No  . Sexually Active: Not Currently     Family History  Problem Relation Age of Onset  . Breast cancer Mother   . Colon cancer Mother   . Hypotension Mother   . Asthma Mother   . Diabetes type II Mother   . Arthritis Mother   . Mental illness Brother   . Heart disease Brother   . Cerebral palsy Daughter   . Emphysema Brother     never smoker  . Clotting disorder Mother        Review of Systems This is positive for allergies, multiple joint pains, visual difficulty, cough, fatigue, itchy skin and night sweats, dyspnea, insomnia, lower extremity edema and the excessive urination and urinary incontinence at times. She has had some urinary tract infections lately she says, status post treatment. Patient ambulates with the help of a rolling walker All other review of systems are negative or as per the history of present illness.    Objective:   Physical Exam General:  Obese and chronically ill Eyes:  anicteric. ENT:   Mouth and posterior pharynx free of lesions, teeth fair at best Neck:   supple w/o thyromegaly or mass.  Lungs: Clear to auscultation bilaterally. Heart:  S1S2, no rubs, murmurs, gallops. Abdomen:  obese, soft, no hepatosplenomegaly, hernia, or mass and BS+. She is tender in RUQ and worse with muscle tension and has lower anterior and lateral rib tenderness  Back:  Mild R CVAT Lymph:  no cervical or supraclavicular adenopathy. Extremities:   no edema Neuro:  A&O x 3.  Psych:  Flat affect.   Data Reviewed: Since November 2012 are reviewed emergency department notes, gynecology operative  notes. Pathology reports. She has had CT of the abdomen and pelvis with contrast in November 2012, she said it without in April 2013. She's had pelvic ultrasounds abdominal ultrasounds and thoracic spine imaging with x-rays. She has a fibroid uterus and she has degenerative changes of the thoracic, lower, and lumbosacral spine.  There are no other significant abnormalities noted on this imaging. I have also reviewed specific images also. I have reviewed the [facility] office note from 10/25/2011, Dr. Glade Lloyd.  Lab Results  Component Value Date   WBC 5.3 10/11/2011   HGB 14.0 10/11/2011   HCT 43.2 10/11/2011   MCV 82.1 10/11/2011   PLT 243 10/11/2011     Chemistry      Component Value Date/Time   NA 137 10/11/2011 1049   K 4.3 10/11/2011 1049   CL 102 10/11/2011 1049   CO2 27 10/11/2011 1049   BUN 9 10/11/2011 1049   CREATININE 0.73 10/11/2011 1049      Component Value Date/Time   CALCIUM 9.4 10/11/2011 1049   ALKPHOS 125* 10/11/2011 1049   AST 16 10/11/2011 1049   ALT 8 10/11/2011 1049   BILITOT 0.7 10/11/2011 1049      July 18 urinalysis negative      Assessment & Plan:   1. Chronic RUQ pain   2. Right flank pain   3. Abdominal wall pain    I believe she has musculoskeletal problems. The physical exam, the imaging studies and history support that.  She does have lower abdominal and pelvic pain that could be her fibroids.  1. Meloxicam 15 mg daily will be taken for the next 2 months 2. I will cover her with a PPI on a daily basis to reduce the risk of ulcer or gastritis 3. Have requested records of previous endoscopic studies from [place] if possible 4. Return in 2 months for reassessment 5. Keep followup with gynecology regarding cervical dysplasia and fibroids.   I appreciate the opportunity to care for this patient.  CC: Oneal Grout, MD, Tracey Harries, MD

## 2011-10-26 NOTE — Patient Instructions (Addendum)
We have sent the following medications to your pharmacy for you to pick up at your convenience: Generic Mobic and Generic Prilosec, follow the directions on the bottles.  Dr. Leone Payor believes your pain to be muscle related.  Follow-up with [place] in 2 months.  Thank you for choosing me and [facility].  Iva Boop, M.D., [facility]

## 2011-10-30 ENCOUNTER — Ambulatory Visit (HOSPITAL_COMMUNITY): Payer: Medicare Other | Attending: Cardiology

## 2011-10-30 ENCOUNTER — Other Ambulatory Visit (HOSPITAL_COMMUNITY): Payer: Medicare Other

## 2011-10-30 DIAGNOSIS — E785 Hyperlipidemia, unspecified: Secondary | ICD-10-CM | POA: Diagnosis not present

## 2011-10-30 DIAGNOSIS — R072 Precordial pain: Secondary | ICD-10-CM | POA: Diagnosis not present

## 2011-10-30 DIAGNOSIS — R0789 Other chest pain: Secondary | ICD-10-CM

## 2011-10-30 DIAGNOSIS — R0609 Other forms of dyspnea: Secondary | ICD-10-CM | POA: Insufficient documentation

## 2011-10-30 DIAGNOSIS — I10 Essential (primary) hypertension: Secondary | ICD-10-CM | POA: Diagnosis not present

## 2011-10-30 DIAGNOSIS — Z87891 Personal history of nicotine dependence: Secondary | ICD-10-CM | POA: Diagnosis not present

## 2011-10-30 DIAGNOSIS — J45909 Unspecified asthma, uncomplicated: Secondary | ICD-10-CM | POA: Diagnosis not present

## 2011-10-30 DIAGNOSIS — R0989 Other specified symptoms and signs involving the circulatory and respiratory systems: Secondary | ICD-10-CM

## 2011-10-30 DIAGNOSIS — E119 Type 2 diabetes mellitus without complications: Secondary | ICD-10-CM | POA: Diagnosis not present

## 2011-10-30 NOTE — Progress Notes (Signed)
Echocardiogram performed.  

## 2011-10-31 ENCOUNTER — Encounter (HOSPITAL_COMMUNITY): Payer: Self-pay | Admitting: Internal Medicine

## 2011-11-02 ENCOUNTER — Other Ambulatory Visit: Payer: Self-pay

## 2011-11-02 ENCOUNTER — Emergency Department (HOSPITAL_COMMUNITY)
Admission: EM | Admit: 2011-11-02 | Discharge: 2011-11-03 | Disposition: A | Discharge status: Home / Self Care | Payer: Medicare Other | Attending: Emergency Medicine | Admitting: Emergency Medicine

## 2011-11-02 DIAGNOSIS — J45909 Unspecified asthma, uncomplicated: Secondary | ICD-10-CM | POA: Insufficient documentation

## 2011-11-02 DIAGNOSIS — N390 Urinary tract infection, site not specified: Secondary | ICD-10-CM | POA: Diagnosis not present

## 2011-11-02 DIAGNOSIS — E119 Type 2 diabetes mellitus without complications: Secondary | ICD-10-CM | POA: Diagnosis not present

## 2011-11-02 DIAGNOSIS — R52 Pain, unspecified: Secondary | ICD-10-CM | POA: Diagnosis not present

## 2011-11-02 DIAGNOSIS — Z79899 Other long term (current) drug therapy: Secondary | ICD-10-CM | POA: Diagnosis not present

## 2011-11-02 DIAGNOSIS — I10 Essential (primary) hypertension: Secondary | ICD-10-CM | POA: Insufficient documentation

## 2011-11-02 DIAGNOSIS — E785 Hyperlipidemia, unspecified: Secondary | ICD-10-CM | POA: Diagnosis not present

## 2011-11-02 DIAGNOSIS — Z853 Personal history of malignant neoplasm of breast: Secondary | ICD-10-CM | POA: Diagnosis not present

## 2011-11-02 DIAGNOSIS — R51 Headache: Secondary | ICD-10-CM | POA: Insufficient documentation

## 2011-11-02 DIAGNOSIS — R300 Dysuria: Secondary | ICD-10-CM | POA: Diagnosis not present

## 2011-11-02 DIAGNOSIS — Z87891 Personal history of nicotine dependence: Secondary | ICD-10-CM | POA: Diagnosis not present

## 2011-11-02 DIAGNOSIS — R109 Unspecified abdominal pain: Secondary | ICD-10-CM | POA: Diagnosis not present

## 2011-11-02 LAB — URINALYSIS, ROUTINE W REFLEX MICROSCOPIC
Bilirubin Urine: NEGATIVE
Glucose, UA: NEGATIVE mg/dL
Hgb urine dipstick: NEGATIVE
Specific Gravity, Urine: 1.021 (ref 1.005–1.030)
pH: 7.5 (ref 5.0–8.0)

## 2011-11-02 LAB — URINE MICROSCOPIC-ADD ON

## 2011-11-02 MED ORDER — SODIUM CHLORIDE 0.9 % IV BOLUS (SEPSIS)
1000.0000 mL | Freq: Once | INTRAVENOUS | Status: AC
  Administered 2011-11-02: 1000 mL via INTRAVENOUS

## 2011-11-02 MED ORDER — CEPHALEXIN 500 MG PO CAPS: 500.0000 mg | ORAL_CAPSULE | Freq: Four times a day (QID) | ORAL | Status: AC

## 2011-11-02 MED ORDER — DIPHENHYDRAMINE HCL 50 MG/ML IJ SOLN
25.0000 mg | Freq: Once | INTRAMUSCULAR | Status: AC
  Administered 2011-11-02: 25 mg via INTRAVENOUS
  Filled 2011-11-02: qty 1

## 2011-11-02 MED ORDER — METOCLOPRAMIDE HCL 5 MG/ML IJ SOLN
10.0000 mg | Freq: Once | INTRAMUSCULAR | Status: AC
  Administered 2011-11-02: 10 mg via INTRAVENOUS
  Filled 2011-11-02: qty 2

## 2011-11-02 NOTE — ED Notes (Signed)
Pt ambulated with a steady gait;VSS; A&Ox3; no signs of distress; respirations even and unlabored; skin warm and dry; no questions at this time.  

## 2011-11-02 NOTE — ED Provider Notes (Signed)
51 year old female had onset this afternoon of a bitemporal headache which is throbbing in nature. There is associated nausea and photophobia. Her exam is significant for normal fundi, supple neck, and tenderness to palpation of the paracervical muscles. Neurologic exam is nonfocal. She'll be given headache cocktail and reassessed.  Dione Booze, MD 11/02/11 2146

## 2011-11-02 NOTE — ED Notes (Signed)
Pt reports she is feeling better. Pain improved.

## 2011-11-02 NOTE — ED Notes (Signed)
PT to ED via [facility] with c/o headache,  EMS st's enroute pt st's headache was better but now is having right flank pain.

## 2011-11-02 NOTE — ED Provider Notes (Signed)
History     CSN: [id number]  Arrival date & time 11/02/11  2013   First MD Initiated Contact with Patient 11/02/11 2029      Chief Complaint  Patient presents with  . Headache    (Consider location/radiation/quality/duration/timing/severity/associated sxs/prior treatment) Patient is a 51 y.o. female presenting with headaches and dysuria. The history is provided by the patient.  Headache  This is a new problem. The current episode started 6 to 12 hours ago. The problem occurs constantly. The problem has not changed since onset.The headache is associated with nothing. The pain is located in the frontal region. The quality of the pain is described as dull. The pain is moderate. The pain does not radiate. Pertinent negatives include no fever, no near-syncope, no palpitations, no syncope, no nausea and no vomiting. She has tried nothing for the symptoms.  Dysuria  This is a recurrent problem. The current episode started 2 days ago. The problem occurs intermittently. The problem has not changed since onset.The quality of the pain is described as burning. The pain is mild. There has been no fever. Associated symptoms include frequency. Pertinent negatives include no chills, no nausea, no vomiting and no urgency. She has tried nothing for the symptoms. Her past medical history is significant for recurrent UTIs.    Past Medical History  Diagnosis Date  . Uterine fibroid   . Hyperlipidemia   . Hypertension   . Asthma   . OSA (obstructive sleep apnea)   . Prolapsed uterus   . SVD (spontaneous vaginal delivery)     x 5  . Breast cancer 11/2008    left  . Seasonal allergies   . Anxiety   . Shortness of breath     occasional  . Headache     otc med prn  . Arthritis     hands, knees  . History of pneumonia     Past Surgical History  Procedure Date  . Eye surgery 1961    right  . Mastectomy 11/2008    done in [place], left  . Breast surgery 11/2008     done in [place] - breast cancer  .  Cervical conization w/bx     done in [place]  . Colonoscopy   . Dilation and curettage of uterus 10/15/2011    Procedure: DILATATION AND CURETTAGE;  Surgeon: Bing Plume, MD;  Location: [facility];  Service: Gynecology;  Laterality: N/A;  Conization    Family History  Problem Relation Age of Onset  . Breast cancer Mother   . Colon cancer Mother   . Hypotension Mother   . Asthma Mother   . Diabetes type II Mother   . Arthritis Mother   . Mental illness Brother   . Heart disease Brother   . Cerebral palsy Daughter   . Emphysema Brother     never smoker  . Clotting disorder Mother     History  Substance Use Topics  . Smoking status: Former Smoker -- 0.3 packs/day for 10 years    Types: Cigarettes    Quit date: 03/26/1988  . Smokeless tobacco: Never Used  . Alcohol Use: No    OB History    Grav Para Term Preterm Abortions TAB SAB Ect Mult Living                  Review of Systems  Constitutional: Negative for fever and chills.  Eyes: Negative.   Respiratory: Negative.   Cardiovascular: Negative.  Negative for palpitations, syncope  and near-syncope.  Gastrointestinal: Negative for nausea, vomiting, abdominal pain, diarrhea and constipation.  Genitourinary: Positive for dysuria and frequency. Negative for urgency, vaginal bleeding, vaginal discharge and vaginal pain.  Musculoskeletal: Negative.   Skin: Negative.   Neurological: Positive for headaches. Negative for dizziness, seizures, syncope, facial asymmetry, weakness, light-headedness and numbness.  All other systems reviewed and are negative.    Allergies  Calcium carbonate antacid  Home Medications   Current Outpatient Rx  Name Route Sig Dispense Refill  . ALBUTEROL SULFATE HFA 108 (90 BASE) MCG/ACT IN AERS Inhalation Inhale 2 puffs into the lungs every 4 (four) hours as needed. Shortness of breath    . ANASTROZOLE 1 MG PO TABS Oral Take 1 tablet (1 mg total) by mouth daily. 30 tablet 5  . CALCIUM 600 + D PO Oral  Take 1 tablet by mouth daily.    Marland Kitchen DILTIAZEM HCL 60 MG PO TABS Oral Take 1 tablet (60 mg total) by mouth 2 (two) times daily. 60 tablet 0  . FUROSEMIDE 40 MG PO TABS Oral Take 40 mg by mouth daily as needed. For fluid retention    . HYDROCODONE-ACETAMINOPHEN 5-325 MG PO TABS Oral Take 1 tablet by mouth every 4 (four) hours as needed. For pain    . LORATADINE 10 MG PO TABS Oral Take 10 mg by mouth daily.     . MELOXICAM 15 MG PO TABS Oral Take 15 mg by mouth at bedtime.     Marland Kitchen MONTELUKAST SODIUM 10 MG PO TABS Oral Take 10 mg by mouth at bedtime.    . OMEPRAZOLE 20 MG PO CPDR Oral Take 20 mg by mouth at bedtime.    . OXYCODONE-ACETAMINOPHEN 5-325 MG PO TABS Oral Take 2 tablets by mouth every 4 (four) hours as needed for pain. 15 tablet 0  . SIMVASTATIN 20 MG PO TABS Oral Take 20 mg by mouth every evening.    . CEPHALEXIN 500 MG PO CAPS Oral Take 1 capsule (500 mg total) by mouth 4 (four) times daily. 20 capsule 0    BP 155/82  Pulse 66  Temp 98.7 F (37.1 C) (Oral)  Resp 16  SpO2 95%  Physical Exam  Nursing note and vitals reviewed. Constitutional: She is oriented to person, place, and time. She appears well-developed and well-nourished. No distress.  HENT:  Head: Normocephalic and atraumatic.  Nose: Nose normal.  Mouth/Throat: Oropharynx is clear and moist.  Eyes: Conjunctivae and EOM are normal. Pupils are equal, round, and reactive to light.  Fundoscopic exam:      The right eye shows no hemorrhage and no papilledema.       The left eye shows no hemorrhage and no papilledema.  Neck: Neck supple.  Cardiovascular: Normal rate, regular rhythm, normal heart sounds and intact distal pulses.   Pulmonary/Chest: Effort normal and breath sounds normal. She has no wheezes. She has no rales.  Abdominal: Soft. She exhibits no distension. There is no tenderness.  Musculoskeletal: Normal range of motion.  Neurological: She is alert and oriented to person, place, and time. She has normal  strength. No cranial nerve deficit or sensory deficit. Coordination and gait normal. GCS eye subscore is 4. GCS verbal subscore is 5. GCS motor subscore is 6.  Skin: Skin is warm and dry.    ED Course  Procedures (including critical care time)  Labs Reviewed  URINALYSIS, ROUTINE W REFLEX MICROSCOPIC - Abnormal; Notable for the following:    APPearance TURBID (*)  Leukocytes, UA LARGE (*)     All other components within normal limits  URINE MICROSCOPIC-ADD ON - Abnormal; Notable for the following:    Bacteria, UA FEW (*)     All other components within normal limits   No results found.   1. Urinary tract infection   2. Headache       MDM  51 yo female with PMHx of HTN, HLD, Asthma who presents for headache and dysuria.  HA gradual in onset today, not quick or thunderclap.  No meningeal signs.  No signs of SAH.  Nml neuroligc exam.  HA similar to previous and consistent with migraine.  Pt has also had 2-3 days of dysuria and frequency.  No abdominal pain or CVA tenderness.  AF, VSS, NAD.  Will get UA and give IVF, Reglan, and Benadryl for headache.  Sx significantly improved after migraine cocktail.  UA with 10-20 WBC.  Will DC home with course of Keflex for UTI.  Dx and Tx plan discussed with pt who voiced understanding.  Return precautions provided.      Cherre Robins, MD 11/03/11 [phone]

## 2011-11-03 NOTE — ED Provider Notes (Signed)
I saw and evaluated the patient, reviewed the resident's note and I agree with the findings and plan.   Anhthu Perdew, MD 11/03/11 1457 

## 2011-11-13 ENCOUNTER — Ambulatory Visit (INDEPENDENT_AMBULATORY_CARE_PROVIDER_SITE_OTHER): Payer: Medicare Other | Admitting: Cardiology

## 2011-11-13 ENCOUNTER — Encounter: Payer: Self-pay | Admitting: Cardiology

## 2011-11-13 VITALS — BP 128/66 | HR 86 | Ht 67.0 in | Wt 266.4 lb

## 2011-11-13 DIAGNOSIS — R0789 Other chest pain: Secondary | ICD-10-CM | POA: Diagnosis not present

## 2011-11-13 DIAGNOSIS — G4733 Obstructive sleep apnea (adult) (pediatric): Secondary | ICD-10-CM

## 2011-11-13 DIAGNOSIS — I519 Heart disease, unspecified: Secondary | ICD-10-CM | POA: Diagnosis not present

## 2011-11-13 DIAGNOSIS — I5189 Other ill-defined heart diseases: Secondary | ICD-10-CM

## 2011-11-13 DIAGNOSIS — E785 Hyperlipidemia, unspecified: Secondary | ICD-10-CM

## 2011-11-13 DIAGNOSIS — I10 Essential (primary) hypertension: Secondary | ICD-10-CM

## 2011-11-13 NOTE — Progress Notes (Signed)
Michaela Little Date of Birth: [birth year] Medical Record #[id number]  History of Present Illness: Michaela Little is seen at the request of Dr. Glade Lloyd for evaluation of diastolic dysfunction. She is a 51 year old black female who has a history of hypertension, obesity, obstructive sleep apnea, and hyperlipidemia. Her major complaint today is of a pain in her right upper quadrant and radiates around to her back. This has been evaluated by CT of the abdomen which was unremarkable. In addition she has been experiencing symptoms of chest pain off and on for over a year. She describes this as a tightness in her central chest with somewhat of a bubble feeling. Sometimes the pain radiates down her left arm. Today it became worse after she had a spell of throwing up. Her symptoms are not clearly exertional. She does have chronic shortness of breath on exertion. She has had fairly extensive cardiac evaluation in the past while in [place]. This included a nuclear stress test, echocardiogram, and lower extremity Doppler studies. The results of these are unknown. The patient does report some intermittent swelling in her legs. She states she has lost 20 pounds this past year. She recently had an echocardiogram which demonstrated normal systolic function with ejection fraction of 60-65%. It was mild LVH. There was a pseudo-normal filling pattern consistent with grade 2 diastolic dysfunction. She also had an echocardiogram in December of 2012 which showed similar findings. In December she also had BNP level drawn within several hours of each other. One level was 109 the second level was 213. She denies any known history of congestive heart failure.  Current Outpatient Prescriptions on File Prior to Visit  Medication Sig Dispense Refill  . albuterol (PROVENTIL HFA;VENTOLIN HFA) 108 (90 BASE) MCG/ACT inhaler Inhale 2 puffs into the lungs every 4 (four) hours as needed. Shortness of breath      . anastrozole (ARIMIDEX) 1 MG  tablet Take 1 tablet (1 mg total) by mouth daily.  30 tablet  5  . Calcium Carbonate-Vitamin D (CALCIUM 600 + D PO) Take 1 tablet by mouth daily.      Marland Kitchen diltiazem (CARDIZEM) 60 MG tablet Take 1 tablet (60 mg total) by mouth 2 (two) times daily.  60 tablet  0  . HYDROcodone-acetaminophen (NORCO/VICODIN) 5-325 MG per tablet Take 1 tablet by mouth every 4 (four) hours as needed. For pain      . loratadine (CLARITIN) 10 MG tablet Take 10 mg by mouth daily.       . meloxicam (MOBIC) 15 MG tablet Take 15 mg by mouth at bedtime.       . montelukast (SINGULAIR) 10 MG tablet Take 10 mg by mouth at bedtime.      Marland Kitchen omeprazole (PRILOSEC) 20 MG capsule Take 20 mg by mouth at bedtime.      . simvastatin (ZOCOR) 20 MG tablet Take 20 mg by mouth every evening.        Allergies  Allergen Reactions  . Calcium Carbonate Antacid Hives    Fruit flavored Tums     Past Medical History  Diagnosis Date  . Uterine fibroid   . Hyperlipidemia   . Hypertension   . Asthma   . OSA (obstructive sleep apnea)   . Prolapsed uterus   . SVD (spontaneous vaginal delivery)     x 5  . Breast cancer 11/2008    left  . Seasonal allergies   . Anxiety   . Shortness of breath     occasional  .  Headache     otc med prn  . Arthritis     hands, knees  . History of pneumonia     Past Surgical History  Procedure Date  . Eye surgery 1961    right  . Mastectomy 11/2008    done in [place], left  . Breast surgery 11/2008     done in [place] - breast cancer  . Cervical conization w/bx     done in [place]  . Colonoscopy   . Dilation and curettage of uterus 10/15/2011    Procedure: DILATATION AND CURETTAGE;  Surgeon: Bing Plume, MD;  Location: [facility];  Service: Gynecology;  Laterality: N/A;  Conization    History  Smoking status  . Former Smoker -- 0.3 packs/day for 10 years  . Types: Cigarettes  . Quit date: 03/26/1988  Smokeless tobacco  . Never Used    History  Alcohol Use No    Family History  Problem Relation Age  of Onset  . Breast cancer Mother   . Colon cancer Mother   . Hypotension Mother   . Asthma Mother   . Diabetes type II Mother   . Arthritis Mother   . Mental illness Brother   . Heart disease Brother   . Cerebral palsy Daughter   . Emphysema Brother     never smoker  . Clotting disorder Mother     Review of Systems: The review of systems is positive for recurrent and intermittent nausea and vomiting. She is disabled because of severe osteoarthritis of the knees. She walks with a walker. She has recurrent urinary tract infections and dysuria. She has a history of breast cancer status post left mastectomy. She did receive chemotherapy and radiation therapy in 2010. All other systems were reviewed and are negative.  Physical Exam: BP 128/66  Pulse 86  Ht 5\' 7"  (1.702 m)  Wt 120.838 kg (266 lb 6.4 oz)  BMI 41.72 kg/m2 She is an obese black female in no acute distress. She is normocephalic, atraumatic. Pupils are equal round and reactive. Her sclera are nonicteric. Oropharynx is clear. Neck is supple without JVD, adenopathy, thyromegaly, or bruits. Lungs are clear. Cardiac exam reveals a regular rate and rhythm without gallop, murmur, or click. Abdomen is soft and nontender. She is no hepatosplenomegaly or masses. She is obese. Extremities are without cyanosis or edema. Posterior tibial pulses are palpable. Skin is warm and dry. She is alert and oriented x3. Mood is somewhat depressed. Cranial nerves II through XII are intact. LABORATORY DATA: ECG dated 11/02/2011 shows normal sinus rhythm with a normal ECG.  Assessment / Plan: 1. Diastolic dysfunction. This was graded at level II by echo. No clinical evidence of congestive heart failure. Diastolic dysfunction is directly related to her history of hypertension and morbid obesity with obstructive sleep apnea. The major focus of treatment at this time is adequate blood pressure control and weight loss. I think diltiazem is a good choice for her  and appears to be controlling her pressure well. There is no edema on exam and I would not recommend diuretics at this point. I think most of her symptoms of dyspnea are related to poor conditioning.  2. Atypical chest pain. Her symptoms are atypical for ischemia. ECG and cardiac exam are normal. She has had a nuclear stress test in [place] about a year ago. We will request a copy of these results to review. She was just started on Prilosec and I would wait at this point  and see how she responds to therapy. If she has refractory chest pain we could repeat a nuclear stress test but I think this is low yield.  3. Morbid obesity with obstructive sleep apnea.  4. Hypertension.   5. Hyperlipidemia.  Ranisha Allaire [place] MD, [facility]

## 2011-11-13 NOTE — Patient Instructions (Signed)
Continue your current medication  We will request your records from [place] to review.

## 2011-11-27 DIAGNOSIS — I5030 Unspecified diastolic (congestive) heart failure: Secondary | ICD-10-CM | POA: Diagnosis not present

## 2011-11-27 DIAGNOSIS — C50919 Malignant neoplasm of unspecified site of unspecified female breast: Secondary | ICD-10-CM | POA: Diagnosis not present

## 2011-11-27 DIAGNOSIS — D259 Leiomyoma of uterus, unspecified: Secondary | ICD-10-CM | POA: Diagnosis not present

## 2011-11-27 DIAGNOSIS — I10 Essential (primary) hypertension: Secondary | ICD-10-CM | POA: Diagnosis not present

## 2011-12-20 ENCOUNTER — Other Ambulatory Visit: Payer: Medicare Other | Admitting: Lab

## 2011-12-27 ENCOUNTER — Ambulatory Visit: Payer: Medicare Other | Admitting: Oncology

## 2011-12-31 ENCOUNTER — Telehealth: Payer: Self-pay | Admitting: Oncology

## 2011-12-31 NOTE — Telephone Encounter (Signed)
Sent letter to pt from Dr.Magrinat. °

## 2012-01-10 ENCOUNTER — Emergency Department (HOSPITAL_COMMUNITY)
Admission: EM | Admit: 2012-01-10 | Discharge: 2012-01-10 | Disposition: A | Discharge status: Home / Self Care | Payer: Medicare Other | Attending: Emergency Medicine | Admitting: Emergency Medicine

## 2012-01-10 ENCOUNTER — Encounter (HOSPITAL_COMMUNITY): Payer: Self-pay | Admitting: Nurse Practitioner

## 2012-01-10 ENCOUNTER — Emergency Department (HOSPITAL_COMMUNITY): Payer: Medicare Other

## 2012-01-10 DIAGNOSIS — Z853 Personal history of malignant neoplasm of breast: Secondary | ICD-10-CM | POA: Insufficient documentation

## 2012-01-10 DIAGNOSIS — E785 Hyperlipidemia, unspecified: Secondary | ICD-10-CM | POA: Diagnosis not present

## 2012-01-10 DIAGNOSIS — I10 Essential (primary) hypertension: Secondary | ICD-10-CM | POA: Insufficient documentation

## 2012-01-10 DIAGNOSIS — Z87891 Personal history of nicotine dependence: Secondary | ICD-10-CM | POA: Diagnosis not present

## 2012-01-10 DIAGNOSIS — G4733 Obstructive sleep apnea (adult) (pediatric): Secondary | ICD-10-CM | POA: Diagnosis not present

## 2012-01-10 DIAGNOSIS — R05 Cough: Secondary | ICD-10-CM | POA: Diagnosis not present

## 2012-01-10 DIAGNOSIS — H1130 Conjunctival hemorrhage, unspecified eye: Secondary | ICD-10-CM | POA: Diagnosis not present

## 2012-01-10 DIAGNOSIS — R079 Chest pain, unspecified: Secondary | ICD-10-CM | POA: Diagnosis not present

## 2012-01-10 DIAGNOSIS — R1110 Vomiting, unspecified: Secondary | ICD-10-CM | POA: Diagnosis not present

## 2012-01-10 DIAGNOSIS — M129 Arthropathy, unspecified: Secondary | ICD-10-CM | POA: Diagnosis not present

## 2012-01-10 LAB — CBC WITH DIFFERENTIAL/PLATELET
Eosinophils Absolute: 0.2 10*3/uL (ref 0.0–0.7)
Lymphocytes Relative: 29 % (ref 12–46)
Lymphs Abs: 1.4 10*3/uL (ref 0.7–4.0)
MCHC: 33.8 g/dL (ref 30.0–36.0)
Monocytes Absolute: 0.3 10*3/uL (ref 0.1–1.0)
Monocytes Relative: 7 % (ref 3–12)
Neutro Abs: 2.9 10*3/uL (ref 1.7–7.7)
Platelets: 238 10*3/uL (ref 150–400)
RDW: 14.3 % (ref 11.5–15.5)
WBC: 4.9 10*3/uL (ref 4.0–10.5)

## 2012-01-10 LAB — COMPREHENSIVE METABOLIC PANEL
Alkaline Phosphatase: 115 U/L (ref 39–117)
BUN: 10 mg/dL (ref 6–23)
GFR calc Af Amer: >90 mL/min (ref >90)
Glucose, Bld: 92 mg/dL (ref 70–99)
Potassium: 4 mEq/L (ref 3.5–5.1)
Total Bilirubin: 0.8 mg/dL (ref 0.3–1.2)
Total Protein: 7.4 g/dL (ref 6.0–8.3)

## 2012-01-10 LAB — URINALYSIS, MICROSCOPIC ONLY
Ketones, ur: NEGATIVE mg/dL
Leukocytes, UA: NEGATIVE
Nitrite: NEGATIVE
Protein, ur: NEGATIVE mg/dL
pH: 5.5 (ref 5.0–8.0)

## 2012-01-10 MED ORDER — KETOROLAC TROMETHAMINE 30 MG/ML IJ SOLN
30.0000 mg | Freq: Once | INTRAMUSCULAR | Status: AC
  Administered 2012-01-10: 30 mg via INTRAVENOUS
  Filled 2012-01-10: qty 1

## 2012-01-10 MED ORDER — ONDANSETRON HCL 4 MG/2ML IJ SOLN
4.0000 mg | Freq: Once | INTRAMUSCULAR | Status: AC
  Administered 2012-01-10: 4 mg via INTRAVENOUS
  Filled 2012-01-10: qty 2

## 2012-01-10 MED ORDER — PROMETHAZINE HCL 25 MG PO TABS: 25.0000 mg | ORAL_TABLET | Freq: Four times a day (QID) | ORAL | Status: DC | PRN

## 2012-01-10 NOTE — ED Notes (Signed)
Pt c/o productive cough, n/v, "red and yellow in my eyes" for past 2 days. A&Ox4, resp e/u

## 2012-01-10 NOTE — ED Provider Notes (Signed)
History     CSN: [id number]  Arrival date & time 01/10/12  1146   First MD Initiated Contact with Patient 01/10/12 1157      Chief Complaint  Patient presents with  . Emesis    (Consider location/radiation/quality/duration/timing/severity/associated sxs/prior treatment) HPI  Pt with complaints of "yellow and red eyes". She denies having any eye pain associated with this. She has chronic RUQ abdominal pains that cause her to vomit on somewhat of a regular basis. She is having no abdominal pains or vomiting today. She was, however, vomiting a couple of days ago. She denies having any liver dysfunction to the best of her knowledge. Denies headache or change in vision. vss nad.   Past Medical History  Diagnosis Date  . Uterine fibroid   . Hyperlipidemia   . Hypertension   . Asthma   . OSA (obstructive sleep apnea)   . Prolapsed uterus   . SVD (spontaneous vaginal delivery)     x 5  . Breast cancer 11/2008    left  . Seasonal allergies   . Anxiety   . Shortness of breath     occasional  . Headache     otc med prn  . Arthritis     hands, knees  . History of pneumonia     Past Surgical History  Procedure Date  . Eye surgery 1961    right  . Mastectomy 11/2008    done in [place], left  . Breast surgery 11/2008     done in [place] - breast cancer  . Cervical conization w/bx     done in [place]  . Colonoscopy   . Dilation and curettage of uterus 10/15/2011    Procedure: DILATATION AND CURETTAGE;  Surgeon: Bing Plume, MD;  Location: [facility];  Service: Gynecology;  Laterality: N/A;  Conization    Family History  Problem Relation Age of Onset  . Breast cancer Mother   . Colon cancer Mother   . Hypotension Mother   . Asthma Mother   . Diabetes type II Mother   . Arthritis Mother   . Mental illness Brother   . Heart disease Brother   . Cerebral palsy Daughter   . Emphysema Brother     never smoker  . Clotting disorder Mother     History  Substance Use Topics  . Smoking  status: Former Smoker -- 0.3 packs/day for 10 years    Types: Cigarettes    Quit date: 03/26/1988  . Smokeless tobacco: Never Used  . Alcohol Use: No    OB History    Grav Para Term Preterm Abortions TAB SAB Ect Mult Living                  Review of Systems   Review of Systems  Gen: no weight loss, fevers, chills, night sweats  Eyes: no discharge or drainage, no occular pain or visual changes , + eye redness and yellowness Nose: no epistaxis or rhinorrhea  Mouth: no dental pain, no sore throat  Neck: no neck pain  Lungs:No wheezing, coughing or hemoptysis CV: no chest pain, palpitations, dependent edema or orthopnea  Abd: no abdominal pain, + nausea, vomiting  GU: no dysuria or gross hematuria  MSK:  No abnormalities  Neuro: no headache, no focal neurologic deficits  Skin: no abnormalities Psyche: negative.    Allergies  Calcium carbonate antacid  Home Medications   Current Outpatient Rx  Name Route Sig Dispense Refill  . ALBUTEROL SULFATE  HFA 108 (90 BASE) MCG/ACT IN AERS Inhalation Inhale 2 puffs into the lungs every 4 (four) hours as needed. Shortness of breath    . ANASTROZOLE 1 MG PO TABS Oral Take 1 tablet (1 mg total) by mouth daily. 30 tablet 5  . CALCIUM 600 + D PO Oral Take 1 tablet by mouth daily.    Marland Kitchen DILTIAZEM HCL 60 MG PO TABS Oral Take 1 tablet (60 mg total) by mouth 2 (two) times daily. 60 tablet 0  . LORATADINE 10 MG PO TABS Oral Take 10 mg by mouth daily.     . MELOXICAM 15 MG PO TABS Oral Take 15 mg by mouth at bedtime.     Marland Kitchen MONTELUKAST SODIUM 10 MG PO TABS Oral Take 10 mg by mouth at bedtime.    . OMEPRAZOLE 20 MG PO CPDR Oral Take 20 mg by mouth at bedtime.    Marland Kitchen PROMETHAZINE HCL 25 MG PO TABS Oral Take 1 tablet (25 mg total) by mouth every 6 (six) hours as needed for nausea. 30 tablet 0    BP 125/85  Pulse 77  Temp 98.1 F (36.7 C) (Oral)  Resp 16  SpO2 97%  Physical Exam  Nursing note and vitals reviewed. Constitutional: She appears  well-developed and well-nourished. No distress.  HENT:  Head: Normocephalic and atraumatic.  Eyes: EOM are normal. Pupils are equal, round, and reactive to light. Right eye exhibits no discharge and no exudate. Left eye exhibits no discharge and no exudate. Right conjunctiva is not injected. Right conjunctiva has a hemorrhage. Left conjunctiva is not injected. Left conjunctiva has no hemorrhage. No scleral icterus.  Neck: Normal range of motion. Neck supple.  Cardiovascular: Normal rate and regular rhythm.   Pulmonary/Chest: Effort normal. She has no wheezes. She has no rales.  Abdominal: Soft. She exhibits no distension. There is tenderness (mild tenderness to palpation). There is no rebound and no guarding.  Neurological: She is alert.  Skin: Skin is warm and dry.    ED Course  Procedures (including critical care time)   Labs Reviewed  CBC WITH DIFFERENTIAL  COMPREHENSIVE METABOLIC PANEL  POCT PREGNANCY, URINE  URINALYSIS, MICROSCOPIC ONLY  URINE CULTURE   Dg Chest 2 View  01/10/2012  *RADIOLOGY REPORT*  Clinical Data: Cough, chest pain.  CHEST - 2 VIEW  Comparison: October 23, 2011.  Findings: Cardiomediastinal silhouette appears normal.  No acute pulmonary disease is noted. Bony thorax is intact.  Surgical clips are noted the left axillary region.  IMPRESSION: No acute cardiopulmonary abnormality seen.   Original Report Authenticated By: Venita Sheffield., M.D.      1. Subconjunctival hemorrhage   2. Vomiting       MDM  Dr. Bebe Shaggy saw patient as well and agrees that "yellowish" hue appears to be a normal variant. Her liver function tests are normal. Her eyes do have some conjunctival hemorrhaging  Most likely from vomiting a couple of days ago. Urine preg test negative, perfect chest xray, CBC, CMP.  Will reassure pt and Rx Phenergan for nausea then refer back to PCP.  Pt has been advised of the symptoms that warrant their return to the ED. Patient has voiced  understanding and has agreed to follow-up with the PCP or specialist.        Dorthula Matas, PA 01/10/12 1421  Dorthula Matas, PA 01/30/12 2108

## 2012-01-10 NOTE — ED Notes (Signed)
Pt ambulatory to the bathroom 

## 2012-01-10 NOTE — ED Notes (Signed)
Spoke with lab about pending UA.  They state it is being run now.

## 2012-01-11 LAB — URINE CULTURE
Colony Count: NO GROWTH
Culture: NO GROWTH

## 2012-01-15 DIAGNOSIS — C50919 Malignant neoplasm of unspecified site of unspecified female breast: Secondary | ICD-10-CM | POA: Diagnosis not present

## 2012-01-16 ENCOUNTER — Other Ambulatory Visit: Payer: Self-pay | Admitting: Internal Medicine

## 2012-01-16 ENCOUNTER — Ambulatory Visit
Admission: RE | Admit: 2012-01-16 | Discharge: 2012-01-16 | Disposition: A | Discharge status: Home / Self Care | Payer: Medicare Other | Source: Ambulatory Visit | Attending: Internal Medicine | Admitting: Internal Medicine

## 2012-01-16 DIAGNOSIS — M62838 Other muscle spasm: Secondary | ICD-10-CM | POA: Diagnosis not present

## 2012-01-16 DIAGNOSIS — I10 Essential (primary) hypertension: Secondary | ICD-10-CM | POA: Diagnosis not present

## 2012-01-16 DIAGNOSIS — R109 Unspecified abdominal pain: Secondary | ICD-10-CM

## 2012-01-16 DIAGNOSIS — E559 Vitamin D deficiency, unspecified: Secondary | ICD-10-CM | POA: Diagnosis not present

## 2012-01-16 DIAGNOSIS — C50919 Malignant neoplasm of unspecified site of unspecified female breast: Secondary | ICD-10-CM | POA: Diagnosis not present

## 2012-01-16 DIAGNOSIS — R110 Nausea: Secondary | ICD-10-CM | POA: Diagnosis not present

## 2012-01-16 MED ORDER — IOHEXOL 300 MG/ML  SOLN
125.0000 mL | Freq: Once | INTRAMUSCULAR | Status: AC | PRN
  Administered 2012-01-16: 125 mL via INTRAVENOUS

## 2012-01-16 MED ORDER — IOHEXOL 300 MG/ML  SOLN
10.0000 mL | Freq: Once | INTRAMUSCULAR | Status: AC | PRN
  Administered 2012-01-16: 10 mL via ORAL

## 2012-01-17 ENCOUNTER — Telehealth: Payer: Self-pay | Admitting: *Deleted

## 2012-01-17 ENCOUNTER — Ambulatory Visit (HOSPITAL_BASED_OUTPATIENT_CLINIC_OR_DEPARTMENT_OTHER): Payer: Medicare Other | Admitting: Oncology

## 2012-01-17 ENCOUNTER — Other Ambulatory Visit (HOSPITAL_BASED_OUTPATIENT_CLINIC_OR_DEPARTMENT_OTHER): Payer: Medicare Other | Admitting: Lab

## 2012-01-17 VITALS — BP 173/78 | HR 74 | Temp 97.9°F | Resp 20 | Ht 67.0 in | Wt 269.9 lb

## 2012-01-17 DIAGNOSIS — I890 Lymphedema, not elsewhere classified: Secondary | ICD-10-CM | POA: Diagnosis not present

## 2012-01-17 DIAGNOSIS — R109 Unspecified abdominal pain: Secondary | ICD-10-CM | POA: Diagnosis not present

## 2012-01-17 DIAGNOSIS — R87610 Atypical squamous cells of undetermined significance on cytologic smear of cervix (ASC-US): Secondary | ICD-10-CM | POA: Diagnosis not present

## 2012-01-17 DIAGNOSIS — C50912 Malignant neoplasm of unspecified site of left female breast: Secondary | ICD-10-CM

## 2012-01-17 DIAGNOSIS — C50919 Malignant neoplasm of unspecified site of unspecified female breast: Secondary | ICD-10-CM

## 2012-01-17 DIAGNOSIS — Z853 Personal history of malignant neoplasm of breast: Secondary | ICD-10-CM

## 2012-01-17 DIAGNOSIS — N649 Disorder of breast, unspecified: Secondary | ICD-10-CM | POA: Diagnosis not present

## 2012-01-17 DIAGNOSIS — J45909 Unspecified asthma, uncomplicated: Secondary | ICD-10-CM

## 2012-01-17 LAB — CBC WITH DIFFERENTIAL/PLATELET
BASO%: 0.5 % (ref 0.0–2.0)
EOS%: 3.6 % (ref 0.0–7.0)
MCH: 27.7 pg (ref 25.1–34.0)
MCHC: 33.1 g/dL (ref 31.5–36.0)
NEUT%: 60.3 % (ref 38.4–76.8)
RBC: 5.11 10*6/uL (ref 3.70–5.45)
RDW: 14.7 % — ABNORMAL HIGH (ref 11.2–14.5)
WBC: 5.1 10*3/uL (ref 3.9–10.3)
lymph#: 1.5 10*3/uL (ref 0.9–3.3)

## 2012-01-17 LAB — COMPREHENSIVE METABOLIC PANEL (CC13)
ALT: 12 U/L (ref 0–55)
AST: 16 U/L (ref 5–34)
Albumin: 3.8 g/dL (ref 3.5–5.0)
Alkaline Phosphatase: 139 U/L (ref 40–150)
BUN: 11 mg/dL (ref 7.0–26.0)
Potassium: 4.3 mEq/L (ref 3.5–5.1)
Sodium: 138 mEq/L (ref 136–145)

## 2012-01-17 LAB — CANCER ANTIGEN 27.29: CA 27.29: 19 U/mL (ref 0–39)

## 2012-01-17 MED ORDER — ANASTROZOLE 1 MG PO TABS: 1.0000 mg | ORAL_TABLET | Freq: Every day | ORAL | Status: DC

## 2012-01-17 NOTE — Progress Notes (Signed)
ID: Michaela Little   DOB: Dec 03, 1960  MR#: [id number]  WUJ#:[id number]  PCP: Oneal Grout, MD SU: GYN: Michaela Little OTHER MD: Etter Sjogren, Stan Head, Sandrea Hughs  HISTORY OF PRESENT ILLNESS: Michaela Little is a [place] woman who moved to [facility] in 2013 with a history of breast cancer, and establishied herself with our practice.  The patient had left breast and left axillary lymph node biopsy performed March of 2010, both positive (I do not have the pathology report at this point). She was treated neo-adjuvantly at [facility] with (a) paclitaxel weekly x12 and (b) doxorubicin/cyclophosphamide in dose dense fashion x4, both given with tifiparnib.   She then proceeded to left modified radical mastectomy 12/23/2008, the final pathology showing a 4 cm residual invasive lobular carcinoma, involving 5/22 lymph nodes sampled. The tumor was estrogen and progesterone receptor positive, HER-2 negative.   Postoperatively she received 50 gray of radiation completed January of 2011, including the left supraclavicular lymph node basin. She was then started on tamoxifen, but developed some uterine lining thickening and was switched to anastrozole in May 2012. Her subsequent history is as detailed below  INTERVAL HISTORY: Pleasant returns today for followup of her left breast carcinoma. She is stressed because she continues to have abdominal problems and despite extensive workup no specific cause has been found.  REVIEW OF SYSTEMS: She localizes the pain to the area under her right breast and above her right inguinal area. It is not in the chest wall or abdominal wall however but "deep". She is not aware of anything that makes it better or worse. She takes muscle relaxants and nonsteroidals for this. She is occasionally constipated and relieves this with food and fiber, but bowel movements or lack of bowel movements are not related to this discomfort. In addition she feels there is a change in the medial  aspect of her right breast. She is waiting to complete her left breast reconstruction under Dr. Odis Luster, but does me she does not yet have a definitive date. She has a runny nose, sinus problems, shortness of breath with activity, and occasional problems with nausea and vomiting. Again these may occur with meals or between meals. She has low back pain and difficulty walking, but she tells me for the last 3 weeks she has not used her walker. There have been no fevers, bleeding, rash, unusual headaches, visual changes, stiff neck, or photophobia. A detailed review of systems was otherwise stable.  PAST MEDICAL HISTORY: Past Medical History  Diagnosis Date  . Uterine fibroid   . Hyperlipidemia   . Hypertension   . Asthma   . OSA (obstructive sleep apnea)   . Prolapsed uterus   . SVD (spontaneous vaginal delivery)     x 5  . Breast cancer 11/2008    left  . Seasonal allergies   . Anxiety   . Shortness of breath     occasional  . Headache     otc med prn  . Arthritis     hands, knees  . History of pneumonia     PAST SURGICAL HISTORY: Past Surgical History  Procedure Date  . Eye surgery 1961    right  . Mastectomy 11/2008    done in [place], left  . Breast surgery 11/2008     done in [place] - breast cancer  . Cervical conization w/bx     done in [place]  . Colonoscopy   . Dilation and curettage of uterus 10/15/2011    Procedure: DILATATION  AND CURETTAGE;  Surgeon: Bing Plume, MD;  Location: [facility];  Service: Gynecology;  Laterality: N/A;  Conization    FAMILY HISTORY Family History  Problem Relation Age of Onset  . Breast cancer Mother   . Colon cancer Mother   . Hypotension Mother   . Asthma Mother   . Diabetes type II Mother   . Arthritis Mother   . Mental illness Brother   . Heart disease Brother   . Cerebral palsy Daughter   . Emphysema Brother     never smoker  . Clotting disorder Mother    Gynecologic history: She is GX P5. At first pregnancy to term age 59. Menarche age  89. Last menstrual period when she had her chemotherapy.   Social History: She is disabled secondary to arthritis. She moved from [place] to [place] for "peace of mind". Of her 5 children only one daughter is living in [place], [place]. One daughter in [place] is incarcerated and another one is "in a lot of stress". The patient lives alone. She mostly reads and does computer games on her phone during the day. She tells me she is starting at a stretching program and would eventually like to start an exercise program. She has 20 grandchildren.    ADVANCED DIRECTIVES: She does not have a healthcare power of attorney.    HEALTH MAINTENANCE: History  Substance Use Topics  . Smoking status: Former Smoker -- 0.3 packs/day for 10 years    Types: Cigarettes    Quit date: 03/26/1988  . Smokeless tobacco: Never Used  . Alcohol Use: No     Colonoscopy:  PAP:  Bone density:  Lipid panel:  Allergies  Allergen Reactions  . Calcium Carbonate Antacid Hives    Fruit flavored Tums     Current Outpatient Prescriptions  Medication Sig Dispense Refill  . albuterol (PROVENTIL HFA;VENTOLIN HFA) 108 (90 BASE) MCG/ACT inhaler Inhale 2 puffs into the lungs every 4 (four) hours as needed. Shortness of breath      . anastrozole (ARIMIDEX) 1 MG tablet Take 1 tablet (1 mg total) by mouth daily.  30 tablet  5  . Calcium Carbonate-Vitamin D (CALCIUM 600 + D PO) Take 1 tablet by mouth daily.      Marland Kitchen diltiazem (CARDIZEM) 60 MG tablet Take 1 tablet (60 mg total) by mouth 2 (two) times daily.  60 tablet  0  . loratadine (CLARITIN) 10 MG tablet Take 10 mg by mouth daily.       . meloxicam (MOBIC) 15 MG tablet Take 15 mg by mouth at bedtime.       . montelukast (SINGULAIR) 10 MG tablet Take 10 mg by mouth at bedtime.      Marland Kitchen omeprazole (PRILOSEC) 20 MG capsule Take 20 mg by mouth at bedtime.      . promethazine (PHENERGAN) 25 MG tablet Take 1 tablet (25 mg total) by mouth every 6 (six) hours as needed for  nausea.  30 tablet  0   No current facility-administered medications for this visit.   Facility-Administered Medications Ordered in Other Visits  Medication Dose Route Frequency Provider Last Rate Last Dose  . iohexol (OMNIPAQUE) 300 MG/ML solution 10 mL  10 mL Oral Once PRN Medication Radiologist, MD   10 mL at 01/16/12 1352  . iohexol (OMNIPAQUE) 300 MG/ML solution 125 mL  125 mL Intravenous Once PRN Medication Radiologist, MD   125 mL at 01/16/12 1353    OBJECTIVE: Middle-aged [place] American woman  who appears chronically ill Filed Vitals:   01/17/12 1101  BP: 173/78  Pulse: 74  Temp: 97.9 F (36.6 C)  Resp: 20     Body mass index is 42.27 kg/(m^2).    ECOG FS: 1  Sclerae unicteric Oropharynx clear No cervical or supraclavicular adenopathy Lungs no rales or rhonchi Heart regular rate and rhythm, no murmur appreciated Abd obese, with mild tenderness to deep palpation in the right upper quadrant and right lower quadrant, but no masses palpated. Positive bowel sounds MSK no focal spinal tenderness Neuro: nonfocal Breasts: I palpated a mild irregularity in the medial aspect of the right breast which is the area of concern to the patient, but she tells me that this is a change. The rest of the breast is unremarkable and in particular I do not see any nipple or skin changes. The right axilla is benign. The left breast is status post mastectomy with expander in place. The skin is very tight. There is no evidence of disease recurrence.     LAB RESULTS: Lab Results  Component Value Date   WBC 5.1 01/17/2012   NEUTROABS 3.1 01/17/2012   HGB 14.1 01/17/2012   HCT 42.7 01/17/2012   MCV 83.6 01/17/2012   PLT 217 01/17/2012      Chemistry      Component Value Date/Time   NA 142 01/10/2012 1246   K 4.0 01/10/2012 1246   CL 108 01/10/2012 1246   CO2 25 01/10/2012 1246   BUN 10 01/10/2012 1246   CREATININE 0.71 01/10/2012 1246      Component Value Date/Time   CALCIUM 9.2  01/10/2012 1246   ALKPHOS 115 01/10/2012 1246   AST 18 01/10/2012 1246   ALT 8 01/10/2012 1246   BILITOT 0.8 01/10/2012 1246       Lab Results  Component Value Date   LABCA2 13 06/27/2011     STUDIES: Dg Chest 2 View  01/10/2012  *RADIOLOGY REPORT*  Clinical Data: Cough, chest pain.  CHEST - 2 VIEW  Comparison: October 23, 2011.  Findings: Cardiomediastinal silhouette appears normal.  No acute pulmonary disease is noted. Bony thorax is intact.  Surgical clips are noted the left axillary region.  IMPRESSION: No acute cardiopulmonary abnormality seen.   Original Report Authenticated By: Venita Sheffield., M.D.    Ct Abdomen Pelvis W Contrast  01/16/2012  *RADIOLOGY REPORT*  Clinical Data:  Chronic right side abdominal pain, nausea, constipation, history of uterine fibroids, left breast cancer post mastectomy, chemotherapy, and radiation therapy  CT ABDOMEN AND PELVIS WITH CONTRAST  Technique:  Multidetector CT imaging of the abdomen and pelvis was performed following the standard protocol during bolus administration of intravenous contrast. Sagittal and coronal MPR images reconstructed from axial data set.  Contrast: 10mL OMNIPAQUE IOHEXOL 300 MG/ML  SOLN, OMNIPAQUE IOHEXOL 300 MG/ML  SOLN Dilute oral contrast.  Comparison: 07/03/2011  Findings: Minimal dependent atelectasis at lung bases. Small probable cyst inferior pole right kidney 10 mm greatest size unchanged since earlier study of 02/21/2011 Post left mastectomy with left breast prosthesis. Liver, spleen, pancreas, kidneys, and adrenal glands otherwise normal. Stomach and bowel loops unremarkable. Normal appendix. Minimally enlarged nodular uterus compatible with leiomyomata, largest discrete nodule 3.0 x 2.8 cm. Unremarkable bladder and ureters. No mass, adenopathy, free fluid or inflammatory process. No hernia or acute bony lesions. Mild facet degenerative changes lower lumbar spine.  IMPRESSION: Probable uterine leiomyomata. No acute  intra abdominal or intrapelvic abnormalities identified. No significant interval change.  Original Report Authenticated By: Lollie Marrow, M.D.      ASSESSMENT: 51 year old [place] woman relocated here from [place],    (1)  left-sided breast cancer diagnosed March of 2010 and treated neo-adjuvantly at [facility] with (a) paclitaxel weekly x12 and (b) doxorubicin/cyclophosphamide in dose dense fashion x4, both given with tifiparnib.   (2) definitive left modified radical mastectomy September of 2010 for a T2 N2 or stage IIIA invasive lobular breast cancer, estrogen and progesterone receptor positive, HER-2 negative,   (3)  post mastectomy radiation completed January of 2011,  (4) briefly on tamoxifen, discontinued it because of uterine lining concerns.  On anastrozole since May 2012 with good tolerance.   (5)  Other problems include left upper extremity lymphedema, borderline concern regarding genetic family history, history of cervical dysplasia, and history of dense breasts.  PLAN: Although I do not palpate any mass or area of concern in the medial aspect of the right breast, Maranatha feels her has been a change there and we will set her up for repeat mammography at [facility] (she had right mammography in April, which was unremarkable). I have asked our breast cancer navigator to meet with the patient to discuss our supportive services. Otherwise she will continue to proceed with reconstruction under Dr. Odis Luster' care. At this 0.3 years out from her definitive local treatment, there is no evidence of disease recurrence. We will see Zehra again in 6 months. She knows to call for problems that may develop before that visit.  Arcadio Cope C    01/17/2012

## 2012-01-17 NOTE — Telephone Encounter (Signed)
07-07-2012 starting at 1:45pm  07-03-2012 [facility] at 2:00pm

## 2012-01-22 ENCOUNTER — Ambulatory Visit: Payer: Medicare Other | Attending: Internal Medicine

## 2012-01-22 DIAGNOSIS — M2560 Stiffness of unspecified joint, not elsewhere classified: Secondary | ICD-10-CM | POA: Insufficient documentation

## 2012-01-22 DIAGNOSIS — IMO0001 Reserved for inherently not codable concepts without codable children: Secondary | ICD-10-CM | POA: Diagnosis not present

## 2012-01-22 DIAGNOSIS — R262 Difficulty in walking, not elsewhere classified: Secondary | ICD-10-CM | POA: Insufficient documentation

## 2012-01-22 DIAGNOSIS — R293 Abnormal posture: Secondary | ICD-10-CM | POA: Insufficient documentation

## 2012-01-22 DIAGNOSIS — M2550 Pain in unspecified joint: Secondary | ICD-10-CM | POA: Insufficient documentation

## 2012-01-28 ENCOUNTER — Ambulatory Visit: Payer: Medicare Other | Attending: Internal Medicine | Admitting: Physical Therapy

## 2012-01-28 DIAGNOSIS — M2550 Pain in unspecified joint: Secondary | ICD-10-CM | POA: Insufficient documentation

## 2012-01-28 DIAGNOSIS — M2560 Stiffness of unspecified joint, not elsewhere classified: Secondary | ICD-10-CM | POA: Diagnosis not present

## 2012-01-28 DIAGNOSIS — IMO0001 Reserved for inherently not codable concepts without codable children: Secondary | ICD-10-CM | POA: Insufficient documentation

## 2012-01-28 DIAGNOSIS — R293 Abnormal posture: Secondary | ICD-10-CM | POA: Insufficient documentation

## 2012-01-28 DIAGNOSIS — R262 Difficulty in walking, not elsewhere classified: Secondary | ICD-10-CM | POA: Insufficient documentation

## 2012-01-29 ENCOUNTER — Encounter: Payer: Self-pay | Admitting: *Deleted

## 2012-01-29 ENCOUNTER — Encounter: Payer: Medicare Other | Admitting: Rehabilitation

## 2012-01-29 NOTE — Progress Notes (Signed)
[facility] Clinical Social Work  Visual merchandiser received referral from Charity fundraiser for assessment of needs and other psychosocial needs.  Pt stated she "was not feeling well, and had been sick the past today."  CSw encouraged pt to call the Dr.'s office of go to the ED if she continued to feel sick.  Pt stated she understood, but was feeling better today.  Pt expressed some issues with transportation.  She is currently using [facility], but would like to explore other transportation options.  CSW provided pt with information for [facility] transportation, and encouraged her to cal with any difficulties.    Tamala Julian, MSW, LCSW Clinical Social Worker [facility] [phone]

## 2012-01-30 ENCOUNTER — Ambulatory Visit: Payer: Medicare Other | Admitting: Physical Therapy

## 2012-01-30 DIAGNOSIS — M2560 Stiffness of unspecified joint, not elsewhere classified: Secondary | ICD-10-CM | POA: Diagnosis not present

## 2012-01-30 DIAGNOSIS — R293 Abnormal posture: Secondary | ICD-10-CM | POA: Diagnosis not present

## 2012-01-30 DIAGNOSIS — M2550 Pain in unspecified joint: Secondary | ICD-10-CM | POA: Diagnosis not present

## 2012-01-30 DIAGNOSIS — R262 Difficulty in walking, not elsewhere classified: Secondary | ICD-10-CM | POA: Diagnosis not present

## 2012-01-30 DIAGNOSIS — IMO0001 Reserved for inherently not codable concepts without codable children: Secondary | ICD-10-CM | POA: Diagnosis not present

## 2012-01-31 ENCOUNTER — Encounter: Payer: Medicare Other | Admitting: Rehabilitation

## 2012-02-01 ENCOUNTER — Ambulatory Visit: Payer: Medicare Other | Admitting: Physical Therapy

## 2012-02-04 ENCOUNTER — Ambulatory Visit: Payer: Medicare Other | Admitting: Physical Therapy

## 2012-02-04 DIAGNOSIS — IMO0001 Reserved for inherently not codable concepts without codable children: Secondary | ICD-10-CM | POA: Diagnosis not present

## 2012-02-04 DIAGNOSIS — M2560 Stiffness of unspecified joint, not elsewhere classified: Secondary | ICD-10-CM | POA: Diagnosis not present

## 2012-02-04 DIAGNOSIS — R262 Difficulty in walking, not elsewhere classified: Secondary | ICD-10-CM | POA: Diagnosis not present

## 2012-02-04 DIAGNOSIS — M2550 Pain in unspecified joint: Secondary | ICD-10-CM | POA: Diagnosis not present

## 2012-02-04 DIAGNOSIS — R293 Abnormal posture: Secondary | ICD-10-CM | POA: Diagnosis not present

## 2012-02-05 NOTE — ED Provider Notes (Signed)
Medical screening examination/treatment/procedure(s) were conducted as a shared visit with non-physician practitioner(s) and myself.  I personally evaluated the patient during the encounter   Joya Gaskins, MD 02/05/12 [phone]

## 2012-02-06 ENCOUNTER — Ambulatory Visit (INDEPENDENT_AMBULATORY_CARE_PROVIDER_SITE_OTHER): Payer: Medicare Other | Admitting: Obstetrics & Gynecology

## 2012-02-06 ENCOUNTER — Encounter: Payer: Self-pay | Admitting: Obstetrics & Gynecology

## 2012-02-06 ENCOUNTER — Ambulatory Visit: Payer: Medicare Other | Admitting: Physical Therapy

## 2012-02-06 VITALS — BP 136/89 | HR 77 | Temp 97.6°F | Ht 65.5 in | Wt 266.5 lb

## 2012-02-06 DIAGNOSIS — M2550 Pain in unspecified joint: Secondary | ICD-10-CM | POA: Diagnosis not present

## 2012-02-06 DIAGNOSIS — M2560 Stiffness of unspecified joint, not elsewhere classified: Secondary | ICD-10-CM | POA: Diagnosis not present

## 2012-02-06 DIAGNOSIS — R293 Abnormal posture: Secondary | ICD-10-CM | POA: Diagnosis not present

## 2012-02-06 DIAGNOSIS — N949 Unspecified condition associated with female genital organs and menstrual cycle: Secondary | ICD-10-CM

## 2012-02-06 DIAGNOSIS — R262 Difficulty in walking, not elsewhere classified: Secondary | ICD-10-CM | POA: Diagnosis not present

## 2012-02-06 DIAGNOSIS — IMO0001 Reserved for inherently not codable concepts without codable children: Secondary | ICD-10-CM | POA: Diagnosis not present

## 2012-02-06 DIAGNOSIS — R102 Pelvic and perineal pain: Secondary | ICD-10-CM | POA: Insufficient documentation

## 2012-02-06 NOTE — Progress Notes (Signed)
Referred here from [facility]  For pelvic pain, prolapsed uterus, vaginal stenosis. Needs referral for pelvic floor  To outpatient rehab

## 2012-02-06 NOTE — Progress Notes (Signed)
Subjective:     Patient ID: Michaela Little, female   DOB: [birth year], 51 y.o.   MRN: [id number]  HPI Pt with h/o breast cancer who is menopausal presents with complaints of pelvic pain for over 1 year.  Pain does not appear to be cyclic.   Pt has a h/o Cone x 2 but reports that her amenorrhea PRECEEDED her cone bx.  Per referral  Mentioned 'prolapse and vaginal stenosis'.  There was also a request for pelvic floor therapy. Review of Systems Past Surgical History  Procedure Date  . Eye surgery 1961    right  . Mastectomy 11/2008    done in [place], left  . Breast surgery 11/2008     done in [place] - breast cancer  . Cervical conization w/bx     done in [place]  . Colonoscopy   . Dilation and curettage of uterus 10/15/2011    Procedure: DILATATION AND CURETTAGE;  Surgeon: Bing Plume, MD;  Location: [facility];  Service: Gynecology;  Laterality: N/A;  Conization      Past Medical History  Diagnosis Date  . Uterine fibroid   . Hyperlipidemia   . Hypertension   . Asthma   . OSA (obstructive sleep apnea)   . Prolapsed uterus   . SVD (spontaneous vaginal delivery)     x 5  . Breast cancer 11/2008    left  . Seasonal allergies   . Anxiety   . Shortness of breath     occasional  . Headache     otc med prn  . Arthritis     hands, knees  . History of pneumonia    Objective:   Physical ExamBP 136/89  Pulse 77  Temp 97.6 F (36.4 C)  Ht 5' 5.5" (1.664 m)  Wt 266 lb 8 oz (120.884 kg)  BMI 43.67 kg/m2 GU: EGBUS: no lesions Vagina: no blood in vault Cervix: no lesion; no mucopurulent d/c; cervix disfigured consistent with prev Cone bx Uterus: small, mobile; no evidence of prolapse Adnexa: no masses; non tender         Assessment:     Pelvic pain of undetermined etiology- possible hematometrea   Plan:     Pelvic floor therapy for pelvic pain 2 outpt rehab [employer] Pelvic sono F/u 3 months or sooner prn

## 2012-02-06 NOTE — Patient Instructions (Addendum)
Pelvic Pain Pelvic pain is pain below the belly button and located between your hips. Acute pain may last a few hours or days. Chronic pelvic pain may last weeks and months. The cause may be different for different types of pain. The pain may be dull or sharp, mild or severe and can interfere with your daily activities. Write down and tell your caregiver:   Exactly where the pain is located.  If it comes and goes or is there all the time.  When it happens (with sex, urination, bowel movement, etc.)  If the pain is related to your menstrual period or stress. Your caregiver will take a full history and do a complete physical exam and Pap test. CAUSES   Painful menstrual periods (dysmenorrhea).  Normal ovulation (Mittelschmertz) that occurs in the middle of the menstrual cycle every month.  The pelvic organs get engorged with blood just before the menstrual period (pelvic congestive syndrome).  Scar tissue from an infection or past surgery (pelvic adhesions).  Cancer of the female pelvic organs. When there is pain with cancer, it has been there for a long time.  The lining of the uterus (endometrium) abnormally grows in places like the pelvis and on the pelvic organs (endometriosis).  A form of endometriosis with the lining of the uterus present inside of the muscle tissue of the uterus (adenomyosis).  Fibroid tumor (noncancerous) in the uterus.  Bladder problems such as infection, bladder spasms of the muscle tissue of the bladder.  Intestinal problems (irritable bowel syndrome, colitis, an ulcer or gastrointestinal infection).  Polyps of the cervix or uterus.  Pregnancy in the tube (ectopic pregnancy).  The opening of the cervix is too small for the menstrual blood to flow through it (cervical stenosis).  Physical or sexual abuse (past or present).  Musculo-skeletal problems from poor posture, problems with the vertebrae of the lower back or the uterine pelvic muscles falling  (prolapse).  Psychological problems such as depression or stress.  IUD (intrauterine device) in the uterus. DIAGNOSIS  Tests to make a diagnosis depends on the type, location, severity and what causes the pain to occur. Tests that may be needed include:  Blood tests.  Urine tests  Ultrasound.  X-rays.  CT Scan.  MRI.  Laparoscopy.  Major surgery. TREATMENT  Treatment will depend on the cause of the pain, which includes:  Prescription or over-the-counter pain medication.  Antibiotics.  Birth control pills.  Hormone treatment.  Nerve blocking injections.  Physical therapy.  Antidepressants.  Counseling with a psychiatrist or psychologist.  Minor or major surgery. HOME CARE INSTRUCTIONS   Only take over-the-counter or prescription medicines for pain, discomfort or fever as directed by your caregiver.  Follow your caregiver's advice to treat your pain.  Rest.  Avoid sexual intercourse if it causes the pain.  Apply warm or cold compresses (which ever works best) to the pain area.  Do relaxation exercises such as yoga or meditation.  Try acupuncture.  Avoid stressful situations.  Try group therapy.  If the pain is because of a stomach/intestinal upset, drink clear liquids, eat a bland light food diet until the symptoms go away. SEEK MEDICAL CARE IF:   You need stronger prescription pain medication.  You develop pain with sexual intercourse.  You have pain with urination.  You develop a temperature of 102 F (38.9 C) with the pain.  You are still in pain after 4 hours of taking prescription medication for the pain.  You need depression medication.    Your IUD is causing pain and you want it removed. SEEK IMMEDIATE MEDICAL CARE IF:  You develop very severe pain or tenderness.  You faint, have chills, severe weakness or dehydration.  You develop heavy vaginal bleeding or passing solid tissue.  You develop a temperature of 102 F (38.9 C)  with the pain.  You have blood in the urine.  You are being physically or sexually abused.  You have uncontrolled vomiting and diarrhea.  You are depressed and afraid of harming yourself or someone else. Document Released: 04/19/2004 Document Revised: 06/04/2011 Document Reviewed: 01/15/2008 ExitCare Patient Information 2013 [employer].  

## 2012-02-07 ENCOUNTER — Encounter: Payer: Medicare Other | Admitting: Rehabilitation

## 2012-02-08 ENCOUNTER — Ambulatory Visit: Payer: Medicare Other | Admitting: Physical Therapy

## 2012-02-08 DIAGNOSIS — R293 Abnormal posture: Secondary | ICD-10-CM | POA: Diagnosis not present

## 2012-02-08 DIAGNOSIS — M2550 Pain in unspecified joint: Secondary | ICD-10-CM | POA: Diagnosis not present

## 2012-02-08 DIAGNOSIS — IMO0001 Reserved for inherently not codable concepts without codable children: Secondary | ICD-10-CM | POA: Diagnosis not present

## 2012-02-08 DIAGNOSIS — M2560 Stiffness of unspecified joint, not elsewhere classified: Secondary | ICD-10-CM | POA: Diagnosis not present

## 2012-02-08 DIAGNOSIS — R262 Difficulty in walking, not elsewhere classified: Secondary | ICD-10-CM | POA: Diagnosis not present

## 2012-02-09 ENCOUNTER — Emergency Department (HOSPITAL_COMMUNITY): Payer: Medicare Other

## 2012-02-09 ENCOUNTER — Encounter (HOSPITAL_COMMUNITY): Payer: Self-pay

## 2012-02-09 ENCOUNTER — Emergency Department (HOSPITAL_COMMUNITY)
Admission: EM | Admit: 2012-02-09 | Discharge: 2012-02-09 | Disposition: A | Discharge status: Home / Self Care | Payer: Medicare Other | Attending: Emergency Medicine | Admitting: Emergency Medicine

## 2012-02-09 DIAGNOSIS — Z8739 Personal history of other diseases of the musculoskeletal system and connective tissue: Secondary | ICD-10-CM | POA: Insufficient documentation

## 2012-02-09 DIAGNOSIS — E785 Hyperlipidemia, unspecified: Secondary | ICD-10-CM | POA: Diagnosis not present

## 2012-02-09 DIAGNOSIS — Z8742 Personal history of other diseases of the female genital tract: Secondary | ICD-10-CM | POA: Insufficient documentation

## 2012-02-09 DIAGNOSIS — R300 Dysuria: Secondary | ICD-10-CM | POA: Diagnosis not present

## 2012-02-09 DIAGNOSIS — R110 Nausea: Secondary | ICD-10-CM | POA: Diagnosis not present

## 2012-02-09 DIAGNOSIS — R630 Anorexia: Secondary | ICD-10-CM | POA: Insufficient documentation

## 2012-02-09 DIAGNOSIS — I10 Essential (primary) hypertension: Secondary | ICD-10-CM | POA: Insufficient documentation

## 2012-02-09 DIAGNOSIS — J309 Allergic rhinitis, unspecified: Secondary | ICD-10-CM | POA: Diagnosis not present

## 2012-02-09 DIAGNOSIS — K2990 Gastroduodenitis, unspecified, without bleeding: Secondary | ICD-10-CM | POA: Insufficient documentation

## 2012-02-09 DIAGNOSIS — K2900 Acute gastritis without bleeding: Secondary | ICD-10-CM | POA: Diagnosis not present

## 2012-02-09 DIAGNOSIS — N390 Urinary tract infection, site not specified: Secondary | ICD-10-CM | POA: Insufficient documentation

## 2012-02-09 DIAGNOSIS — R109 Unspecified abdominal pain: Secondary | ICD-10-CM | POA: Diagnosis not present

## 2012-02-09 DIAGNOSIS — M549 Dorsalgia, unspecified: Secondary | ICD-10-CM | POA: Diagnosis not present

## 2012-02-09 DIAGNOSIS — Z853 Personal history of malignant neoplasm of breast: Secondary | ICD-10-CM | POA: Diagnosis not present

## 2012-02-09 DIAGNOSIS — Z791 Long term (current) use of non-steroidal anti-inflammatories (NSAID): Secondary | ICD-10-CM | POA: Insufficient documentation

## 2012-02-09 DIAGNOSIS — K2970 Gastritis, unspecified, without bleeding: Secondary | ICD-10-CM | POA: Diagnosis not present

## 2012-02-09 DIAGNOSIS — C50919 Malignant neoplasm of unspecified site of unspecified female breast: Secondary | ICD-10-CM | POA: Insufficient documentation

## 2012-02-09 DIAGNOSIS — Z8701 Personal history of pneumonia (recurrent): Secondary | ICD-10-CM | POA: Diagnosis not present

## 2012-02-09 DIAGNOSIS — J45909 Unspecified asthma, uncomplicated: Secondary | ICD-10-CM | POA: Insufficient documentation

## 2012-02-09 DIAGNOSIS — Z8659 Personal history of other mental and behavioral disorders: Secondary | ICD-10-CM | POA: Diagnosis not present

## 2012-02-09 DIAGNOSIS — Z79899 Other long term (current) drug therapy: Secondary | ICD-10-CM | POA: Insufficient documentation

## 2012-02-09 DIAGNOSIS — G4733 Obstructive sleep apnea (adult) (pediatric): Secondary | ICD-10-CM | POA: Diagnosis not present

## 2012-02-09 DIAGNOSIS — Z87891 Personal history of nicotine dependence: Secondary | ICD-10-CM | POA: Diagnosis not present

## 2012-02-09 DIAGNOSIS — R1011 Right upper quadrant pain: Secondary | ICD-10-CM | POA: Diagnosis not present

## 2012-02-09 LAB — COMPREHENSIVE METABOLIC PANEL
AST: 17 U/L (ref 0–37)
Albumin: 3.9 g/dL (ref 3.5–5.2)
Alkaline Phosphatase: 124 U/L — ABNORMAL HIGH (ref 39–117)
BUN: 15 mg/dL (ref 6–23)
Chloride: 100 mEq/L (ref 96–112)
Potassium: 4.1 mEq/L (ref 3.5–5.1)
Total Bilirubin: 0.6 mg/dL (ref 0.3–1.2)

## 2012-02-09 LAB — CBC WITH DIFFERENTIAL/PLATELET
Basophils Absolute: 0 10*3/uL (ref 0.0–0.1)
Eosinophils Absolute: 0.2 10*3/uL (ref 0.0–0.7)
Eosinophils Relative: 4 % (ref 0–5)
MCH: 27.8 pg (ref 26.0–34.0)
MCV: 82.5 fL (ref 78.0–100.0)
Platelets: 231 10*3/uL (ref 150–400)
RDW: 13.7 % (ref 11.5–15.5)

## 2012-02-09 LAB — URINALYSIS, MICROSCOPIC ONLY
Bilirubin Urine: NEGATIVE
Hgb urine dipstick: NEGATIVE
Specific Gravity, Urine: 1.027 (ref 1.005–1.030)
Urobilinogen, UA: 1 mg/dL (ref 0.0–1.0)

## 2012-02-09 LAB — LIPASE, BLOOD: Lipase: 21 U/L (ref 11–59)

## 2012-02-09 MED ORDER — GI COCKTAIL ~~LOC~~
30.0000 mL | Freq: Once | ORAL | Status: AC
  Administered 2012-02-09: 30 mL via ORAL
  Filled 2012-02-09: qty 30

## 2012-02-09 MED ORDER — MORPHINE SULFATE 4 MG/ML IJ SOLN
4.0000 mg | Freq: Once | INTRAMUSCULAR | Status: AC
  Administered 2012-02-09: 4 mg via INTRAVENOUS
  Filled 2012-02-09: qty 1

## 2012-02-09 MED ORDER — ONDANSETRON HCL 4 MG/2ML IJ SOLN
4.0000 mg | Freq: Once | INTRAMUSCULAR | Status: AC
  Administered 2012-02-09: 4 mg via INTRAVENOUS
  Filled 2012-02-09: qty 2

## 2012-02-09 MED ORDER — ALUM & MAG HYDROXIDE-SIMETH 400-400-40 MG/5ML PO SUSP: 10.0000 mL | Freq: Four times a day (QID) | ORAL | Status: DC | PRN

## 2012-02-09 MED ORDER — CEPHALEXIN 250 MG PO CAPS: 500.0000 mg | ORAL_CAPSULE | Freq: Three times a day (TID) | ORAL | Status: DC

## 2012-02-09 NOTE — ED Provider Notes (Signed)
History     CSN: [id number]  Arrival date & time 02/09/12  1919   First MD Initiated Contact with Patient 02/09/12 1935      Chief Complaint  Patient presents with  . Abdominal Pain    (Consider location/radiation/quality/duration/timing/severity/associated sxs/prior treatment) HPI Comments: The patient presents with recurrence of her right-sided abdominal pain she has been evaluated for in the past. She states that it is identical to her prior episodes. It is in her right upper quadrant and she's had this before but it today it radiated to her back which is new. She had nausea and dry heaves.  Patient is a 51 y.o. female presenting with abdominal pain. The history is provided by the patient. No language interpreter was used.  Abdominal Pain The primary symptoms of the illness include abdominal pain and nausea. The primary symptoms of the illness do not include fever, fatigue, shortness of breath, vomiting, diarrhea, hematemesis, hematochezia, dysuria, vaginal discharge or vaginal bleeding. The current episode started yesterday. The onset of the illness was gradual. The problem has been gradually worsening.  The abdominal pain began yesterday. The pain came on gradually. The abdominal pain has been gradually worsening since its onset. The abdominal pain is located in the RUQ. The abdominal pain radiates to the back. The severity of the abdominal pain is 4/10. The abdominal pain is relieved by nothing.  The patient has not had a change in bowel habit. Additional symptoms associated with the illness include chills, anorexia and back pain.    Past Medical History  Diagnosis Date  . Uterine fibroid   . Hyperlipidemia   . Hypertension   . Asthma   . OSA (obstructive sleep apnea)   . Prolapsed uterus   . SVD (spontaneous vaginal delivery)     x 5  . Breast cancer 11/2008    left  . Seasonal allergies   . Anxiety   . Shortness of breath     occasional  . Headache     otc med prn  .  Arthritis     hands, knees  . History of pneumonia     Past Surgical History  Procedure Date  . Eye surgery 1961    right  . Mastectomy 11/2008    done in [place], left  . Breast surgery 11/2008     done in [place] - breast cancer  . Cervical conization w/bx     done in [place]  . Colonoscopy   . Dilation and curettage of uterus 10/15/2011    Procedure: DILATATION AND CURETTAGE;  Surgeon: Bing Plume, MD;  Location: [facility];  Service: Gynecology;  Laterality: N/A;  Conization    Family History  Problem Relation Age of Onset  . Breast cancer Mother   . Colon cancer Mother   . Hypotension Mother   . Asthma Mother   . Diabetes type II Mother   . Arthritis Mother   . Mental illness Brother   . Heart disease Brother   . Cerebral palsy Daughter   . Emphysema Brother     never smoker  . Clotting disorder Mother     History  Substance Use Topics  . Smoking status: Former Smoker -- 0.3 packs/day for 10 years    Types: Cigarettes    Quit date: 03/26/1988  . Smokeless tobacco: Never Used  . Alcohol Use: No    OB History    Grav Para Term Preterm Abortions TAB SAB Ect Mult Living   10 5 5  5 2 3   5       Review of Systems  Constitutional: Positive for chills. Negative for fever, activity change, appetite change and fatigue.  HENT: Negative for congestion, rhinorrhea, neck pain, neck stiffness and sinus pressure.   Eyes: Negative for discharge and visual disturbance.  Respiratory: Negative for cough, chest tightness, shortness of breath, wheezing and stridor.   Cardiovascular: Negative for chest pain and leg swelling.  Gastrointestinal: Positive for nausea, abdominal pain and anorexia. Negative for vomiting, diarrhea, hematochezia, abdominal distention and hematemesis.  Genitourinary: Negative for dysuria, decreased urine volume, vaginal bleeding, vaginal discharge and difficulty urinating.  Musculoskeletal: Positive for back pain. Negative for arthralgias.  Skin: Negative for color  change and pallor.  Neurological: Negative for weakness, light-headedness and headaches.  Psychiatric/Behavioral: Negative for behavioral problems and agitation.  All other systems reviewed and are negative.    Allergies  Calcium carbonate antacid  Home Medications   Current Outpatient Rx  Name  Route  Sig  Dispense  Refill  . ALBUTEROL SULFATE HFA 108 (90 BASE) MCG/ACT IN AERS   Inhalation   Inhale 2 puffs into the lungs every 4 (four) hours as needed. Shortness of breath         . ANASTROZOLE 1 MG PO TABS   Oral   Take 1 tablet (1 mg total) by mouth daily.   30 tablet   6   . CALCIUM 600 + D PO   Oral   Take 1 tablet by mouth daily.         Marland Kitchen DILTIAZEM HCL 60 MG PO TABS   Oral   Take 1 tablet (60 mg total) by mouth 2 (two) times daily.   60 tablet   0   . LORATADINE 10 MG PO TABS   Oral   Take 10 mg by mouth daily.          . MELOXICAM 15 MG PO TABS   Oral   Take 15 mg by mouth at bedtime.          Marland Kitchen MONTELUKAST SODIUM 10 MG PO TABS   Oral   Take 10 mg by mouth at bedtime.         . OMEPRAZOLE 20 MG PO CPDR   Oral   Take 20 mg by mouth at bedtime.         Marland Kitchen PROMETHAZINE HCL 25 MG PO TABS   Oral   Take 1 tablet (25 mg total) by mouth every 6 (six) hours as needed for nausea.   30 tablet   0     BP 152/98  Pulse 81  Temp 98.6 F (37 C) (Oral)  Resp 22  SpO2 100%  Physical Exam  Nursing note and vitals reviewed. Constitutional: She is oriented to person, place, and time. She appears well-developed and well-nourished. No distress.  HENT:  Head: Normocephalic and atraumatic.  Mouth/Throat: No oropharyngeal exudate.  Eyes: EOM are normal. Pupils are equal, round, and reactive to light. Right eye exhibits no discharge. Left eye exhibits no discharge.  Neck: Normal range of motion. Neck supple. No JVD present.  Cardiovascular: Normal rate, regular rhythm and normal heart sounds.   Pulmonary/Chest: Effort normal and breath sounds normal.  No stridor. No respiratory distress. She exhibits no tenderness.  Abdominal: Soft. Bowel sounds are normal. She exhibits no distension. There is tenderness ( when I initially pushed on her right upper quadrant she nearly jumped out of the bed. When I palpated again she had  a negative Murphy sign. I would call this equivocal). There is no guarding.  Musculoskeletal: Normal range of motion. She exhibits no edema and no tenderness (no CVA tenderness.).  Neurological: She is alert and oriented to person, place, and time. No cranial nerve deficit. She exhibits normal muscle tone.  Skin: Skin is warm and dry. No rash noted. She is not diaphoretic.  Psychiatric: She has a normal mood and affect. Her behavior is normal. Judgment and thought content normal.    ED Course  Procedures (including critical care time)  Labs Reviewed  URINALYSIS, MICROSCOPIC ONLY - Abnormal; Notable for the following:    APPearance CLOUDY (*)     Leukocytes, UA MODERATE (*)     Squamous Epithelial / LPF FEW (*)     Crystals CA OXALATE CRYSTALS (*)     All other components within normal limits  COMPREHENSIVE METABOLIC PANEL - Abnormal; Notable for the following:    Alkaline Phosphatase 124 (*)     All other components within normal limits  CBC WITH DIFFERENTIAL - Abnormal; Notable for the following:    RBC 5.15 (*)     All other components within normal limits  LIPASE, BLOOD   US Abdomen Complete  02/09/2012  *RADIOLOGY REPORT*  Clinical Data:  Abdominal and back pain.  History breast cancer. Evaluate for cholecystitis.  COMPLETE ABDOMINAL ULTRASOUND  Comparison:  01/16/2012.  CT  Findings:  Gallbladder:  No gallstones, gallbladder wall thickening, or pericholecystic fluid.  Common bile duct:  3.9 mm.  Distal aspect not visualized secondary to bowel gas  Liver:  Slightly inhomogeneous without focal mass.  IVC:  Not visualized secondary to bowel gas.  Pancreas:  Not visualized secondary to bowel gas.  Spleen:  5.1 cm.  No  focal mass.  Right Kidney:  10.9 cm. No hydronephrosis or renal mass.  Left Kidney:  11.9 cm. No hydronephrosis or renal mass.  Slightly limit evaluation of the left kidney secondary to bowel gas.  Abdominal aorta:  Not well visualized secondary to bowel gas.  IMPRESSION: No sonographic evidence of cholecystitis.  Secondary to bowel gas, poor delineation of the inferior vena cava, pancreas, left kidney and aorta.   Original Report Authenticated By: Lacy Duverney, M.D.      1. UTI (lower urinary tract infection)   2. Gastritis       MDM  7:51 PM RUQ pain, will get labs and w/u for eval of biliary colic vs cholecystitis. Had a nml abd CT a month ago, doubt it needs to be repeated, doubt appy, renal colic, obstr, perf, GYN etiology. Also considered hepatitis or gastritis. Stable.  9:15 PM labs and US unremarkable. Likely gastritis. May have UTI, will tx. Pt deemed stable for discharge. Return precautions were provided and pt expressed understanding to return to ED if any acute symptoms return. Follow up was instructed which pt also expressed understanding. All questions were answered and pt was in agreement w/ plan.      Warrick Parisian, MD 02/09/12 2116

## 2012-02-09 NOTE — ED Notes (Signed)
Pt presents from ems for abdominal and back pain.  States that she has hx of this but has flared up today.  She denies emesis but does have some nausea and gas.  Pt states that urine is dark and has an odor.

## 2012-02-09 NOTE — ED Notes (Signed)
Pt dc to home.  Pt verbalizes understanding to dc instructions.  Pt will f/u as needed with pcp.  Pt ambulatory to exit without difficulty.

## 2012-02-11 NOTE — ED Provider Notes (Signed)
I saw and evaluated the patient, reviewed the resident's note and I agree with the findings and plan.  RUQ pain radiating to mid back. L CVAT.  Mild TTP RUQ without peritoneal signs.  Glynn Octave, MD 02/11/12 [phone]

## 2012-02-12 ENCOUNTER — Ambulatory Visit (HOSPITAL_COMMUNITY): Payer: Medicare Other

## 2012-02-13 DIAGNOSIS — Z853 Personal history of malignant neoplasm of breast: Secondary | ICD-10-CM | POA: Diagnosis not present

## 2012-02-13 DIAGNOSIS — R1031 Right lower quadrant pain: Secondary | ICD-10-CM | POA: Diagnosis not present

## 2012-02-13 DIAGNOSIS — R51 Headache: Secondary | ICD-10-CM | POA: Diagnosis not present

## 2012-02-13 DIAGNOSIS — I10 Essential (primary) hypertension: Secondary | ICD-10-CM | POA: Diagnosis not present

## 2012-02-13 DIAGNOSIS — R1011 Right upper quadrant pain: Secondary | ICD-10-CM | POA: Diagnosis not present

## 2012-02-13 DIAGNOSIS — R112 Nausea with vomiting, unspecified: Secondary | ICD-10-CM | POA: Diagnosis not present

## 2012-02-13 DIAGNOSIS — D259 Leiomyoma of uterus, unspecified: Secondary | ICD-10-CM | POA: Diagnosis not present

## 2012-02-13 DIAGNOSIS — M549 Dorsalgia, unspecified: Secondary | ICD-10-CM | POA: Diagnosis not present

## 2012-02-13 DIAGNOSIS — Z87440 Personal history of urinary (tract) infections: Secondary | ICD-10-CM | POA: Diagnosis not present

## 2012-02-14 ENCOUNTER — Ambulatory Visit: Payer: Medicare Other | Admitting: Physical Therapy

## 2012-02-15 ENCOUNTER — Ambulatory Visit: Payer: Medicare Other | Admitting: Physical Therapy

## 2012-02-18 ENCOUNTER — Telehealth: Payer: Self-pay | Admitting: *Deleted

## 2012-02-18 ENCOUNTER — Ambulatory Visit: Payer: Medicare Other | Admitting: Physical Therapy

## 2012-02-18 NOTE — Telephone Encounter (Signed)
Michaela Little called reporting she is having a reaction to anastrozole.  "I called and left a message that I am no longer taking it and haven't heard anything."  She stopped the medicine over a week ago.  I'm no longer breaking out in hives but says it's too early when I asked if she can tell a difference yet.  Reports symptoms she says are all on her anastrozole patient information sheet.  S.E./Symptoms : Breaks out in hives.  Vomits daily up to three times a day.  Abdominal pain and back pain off and on to upper and lower abdomen on the right side.  Eye irritation in which eyes are red and sometimes yellow with mucus drainage.  Swelling to upper right side of abdomen.  Feels heat/warmth to swollen area of abdomen in the location of previous surgery.  Reports a metal expander there since 2010 surgery.  Has been on anastrozole since 2010.  All symptoms have been off and on for a year but more aggressive the past week.  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  Last colonoscopy was 2010 and was told she can't have this done yet.  Next f/u is July 07, 2012.

## 2012-02-19 ENCOUNTER — Telehealth: Payer: Self-pay | Admitting: Oncology

## 2012-02-19 ENCOUNTER — Telehealth: Payer: Self-pay | Admitting: *Deleted

## 2012-02-19 ENCOUNTER — Ambulatory Visit: Payer: Medicare Other | Admitting: Physical Therapy

## 2012-02-19 NOTE — Telephone Encounter (Signed)
S/w the pt and she is aware of her dec appts °

## 2012-02-19 NOTE — Telephone Encounter (Signed)
This RN called pt this am to discuss her concerns with arimidex and ongoing GI discomfort. Per discussion pt will continue off medication and keep a diary of symptoms and an appointment will be made in approximately a month.  Of note pt is also being worked up by [employer] for fibroids.  No other needs at this time.  Onc tx schedule made and sent to scheduling.

## 2012-02-22 ENCOUNTER — Ambulatory Visit (HOSPITAL_COMMUNITY)
Admission: RE | Admit: 2012-02-22 | Discharge: 2012-02-22 | Disposition: A | Discharge status: Home / Self Care | Payer: Medicare Other | Source: Ambulatory Visit | Attending: Obstetrics & Gynecology | Admitting: Obstetrics & Gynecology

## 2012-02-22 DIAGNOSIS — D252 Subserosal leiomyoma of uterus: Secondary | ICD-10-CM | POA: Diagnosis not present

## 2012-02-22 DIAGNOSIS — N949 Unspecified condition associated with female genital organs and menstrual cycle: Secondary | ICD-10-CM | POA: Insufficient documentation

## 2012-02-22 DIAGNOSIS — Z853 Personal history of malignant neoplasm of breast: Secondary | ICD-10-CM | POA: Insufficient documentation

## 2012-02-22 DIAGNOSIS — R102 Pelvic and perineal pain: Secondary | ICD-10-CM

## 2012-02-25 ENCOUNTER — Telehealth: Payer: Self-pay | Admitting: Obstetrics and Gynecology

## 2012-02-25 NOTE — Telephone Encounter (Signed)
Patient left message wanting ultrasound results. It does not look like the order came from our clinic. CAlled patient and left message to call us back to further discuss her request.

## 2012-02-26 ENCOUNTER — Telehealth: Payer: Self-pay | Admitting: *Deleted

## 2012-02-26 ENCOUNTER — Inpatient Hospital Stay (HOSPITAL_COMMUNITY)
Admission: AD | Admit: 2012-02-26 | Discharge: 2012-02-26 | Disposition: A | Discharge status: Home / Self Care | Payer: Medicare Other | Source: Ambulatory Visit | Attending: Obstetrics & Gynecology | Admitting: Obstetrics & Gynecology

## 2012-02-26 ENCOUNTER — Encounter (HOSPITAL_COMMUNITY): Payer: Self-pay | Admitting: *Deleted

## 2012-02-26 DIAGNOSIS — K5900 Constipation, unspecified: Secondary | ICD-10-CM | POA: Diagnosis not present

## 2012-02-26 DIAGNOSIS — R109 Unspecified abdominal pain: Secondary | ICD-10-CM

## 2012-02-26 DIAGNOSIS — R112 Nausea with vomiting, unspecified: Secondary | ICD-10-CM | POA: Diagnosis not present

## 2012-02-26 DIAGNOSIS — D259 Leiomyoma of uterus, unspecified: Secondary | ICD-10-CM | POA: Diagnosis not present

## 2012-02-26 LAB — CBC
Hemoglobin: 13.4 g/dL (ref 12.0–15.0)
MCHC: 32.8 g/dL (ref 30.0–36.0)
Platelets: 228 10*3/uL (ref 150–400)
RDW: 13.9 % (ref 11.5–15.5)

## 2012-02-26 LAB — URINALYSIS, ROUTINE W REFLEX MICROSCOPIC
Ketones, ur: NEGATIVE mg/dL
Nitrite: NEGATIVE
Urobilinogen, UA: 4 mg/dL — ABNORMAL HIGH (ref 0.0–1.0)
pH: 6 (ref 5.0–8.0)

## 2012-02-26 LAB — COMPREHENSIVE METABOLIC PANEL
ALT: 9 U/L (ref 0–35)
AST: 14 U/L (ref 0–37)
Albumin: 3.6 g/dL (ref 3.5–5.2)
Alkaline Phosphatase: 124 U/L — ABNORMAL HIGH (ref 39–117)
Calcium: 9.3 mg/dL (ref 8.4–10.5)
Potassium: 4.1 mEq/L (ref 3.5–5.1)
Sodium: 137 mEq/L (ref 135–145)
Total Protein: 7.2 g/dL (ref 6.0–8.3)

## 2012-02-26 LAB — URINE MICROSCOPIC-ADD ON

## 2012-02-26 MED ORDER — METOCLOPRAMIDE HCL 10 MG PO TABS: 10.0000 mg | ORAL_TABLET | Freq: Four times a day (QID) | ORAL | Status: DC

## 2012-02-26 MED ORDER — POLYETHYLENE GLYCOL 3350 17 G PO PACK: 17.0000 g | PACK | Freq: Every day | ORAL | Status: DC

## 2012-02-26 NOTE — Telephone Encounter (Signed)
Chart reviewed. [place] was ordered by Dr. Erin Fulling. She has not yet reviewed results. Will send in basket msg asking to review results and give recommendation before calling the patient.

## 2012-02-26 NOTE — Telephone Encounter (Addendum)
Pt left message requesting results of [place] from 11/29.  She also states she is having vomiting and pain. I returned her call and left message that the results have not been reviewed by Dr. Erin Fulling as of yet. We will call when we have additional information.  I also stated that her vomiting is not related to a Gyn problem. She may have a virus or have eaten something that has upset her stomach.

## 2012-02-26 NOTE — MAU Note (Signed)
Ongoing problem with abd pain, rt and left side, upper and lower. (few months).  Had work up at [facility], scheduled for an [place] here. Was told fibroids.  No bleeding, no d/c. Constant vomiting last couple wks.  Chest pain- tightness started today- esp when she vomits.

## 2012-02-26 NOTE — MAU Provider Note (Signed)
Attestation of Attending Supervision of Advanced Practitioner (CNM/NP): Evaluation and management procedures were performed by the Advanced Practitioner under my supervision and collaboration.  I have reviewed the Advanced Practitioner's note and chart, and I agree with the management and plan.  HARRAWAY-SMITH, Edithe Dobbin 10:21 PM     

## 2012-02-26 NOTE — MAU Provider Note (Signed)
History     CSN: [id number]  Arrival date and time: 02/26/12 1506   First Provider Initiated Contact with Patient 02/26/12 1619      Chief Complaint  Patient presents with  . Abdominal Pain  . Emesis  . Chest Pain   HPI Comments: Michaela Little is a 51 y.o. [id number] female who presents today for abdominal pain and nausea and vomiting. She was seen in GYN clinic on 02/22/12 and an ultrasound was performed which showed a right posterior fibroid. For the past two weeks she reports she is nauseas all the time and has been vomiting daily, can't keep anything down. Her old symptoms included right sided abdominal pain only, but not it has moved to her left side as well. Also notes right sided posterior back pain. Last BM was five days ago, and was painful. Also reports a "bulge" in her right upper abdomen that has been there for an unknown amount of time. Concern for colon cancer as she has family history, had colonoscopy 3 years ago that was normal. No melena or blood in her stool. Never took the medication that prescribed to her for nausea because she was afraid it would affect her liver. Denies pelvic pain, vaginal bleeding or vaginal discharge. Also reports 3 episodes of transient chest pain over the past week with right arm pain sometimes. Is not nauseas and does not have chest pain right now. She is very concerned that the increased severity of her symptoms mean cancer of some sort. She has a personal history of breast cancer.   Abdominal Pain Associated symptoms include constipation, headaches, nausea and vomiting. Pertinent negatives include no diarrhea, dysuria, fever, frequency or hematuria.  Emesis  Associated symptoms include abdominal pain, chest pain and headaches. Pertinent negatives include no chills, diarrhea or fever.  Chest Pain  Associated symptoms include abdominal pain, headaches, nausea and vomiting. Pertinent negatives include no fever.      Past Medical History   Diagnosis Date  . Uterine fibroid   . Hyperlipidemia   . Hypertension   . Asthma   . OSA (obstructive sleep apnea)   . Prolapsed uterus   . SVD (spontaneous vaginal delivery)     x 5  . Breast cancer 11/2008    left  . Seasonal allergies   . Anxiety   . Shortness of breath     occasional  . Headache     otc med prn  . Arthritis     hands, knees  . History of pneumonia     Past Surgical History  Procedure Date  . Eye surgery 1961    right  . Mastectomy 11/2008    done in [place], left  . Breast surgery 11/2008     done in [place] - breast cancer  . Cervical conization w/bx     done in [place]  . Colonoscopy   . Dilation and curettage of uterus 10/15/2011    Procedure: DILATATION AND CURETTAGE;  Surgeon: Bing Plume, MD;  Location: [facility];  Service: Gynecology;  Laterality: N/A;  Conization    Family History  Problem Relation Age of Onset  . Breast cancer Mother   . Colon cancer Mother   . Hypotension Mother   . Asthma Mother   . Diabetes type II Mother   . Arthritis Mother   . Mental illness Brother   . Heart disease Brother   . Cerebral palsy Daughter   . Emphysema Brother     never smoker  .  Clotting disorder Mother     History  Substance Use Topics  . Smoking status: Former Smoker -- 0.3 packs/day for 10 years    Types: Cigarettes    Quit date: 03/26/1988  . Smokeless tobacco: Never Used  . Alcohol Use: No    Allergies:  Allergies  Allergen Reactions  . Calcium Carbonate Antacid Hives    Fruit flavored Tums     Prescriptions prior to admission  Medication Sig Dispense Refill  . albuterol (PROVENTIL HFA;VENTOLIN HFA) 108 (90 BASE) MCG/ACT inhaler Inhale 2 puffs into the lungs every 6 (six) hours as needed. For wheezing      . anastrozole (ARIMIDEX) 1 MG tablet Take 1 mg by mouth daily. Pt says that DR. Told her to stop taking about 3 weeks ago because it agitated the fibroids      . diltiazem (CARDIZEM) 60 MG tablet Take 1 tablet (60 mg total) by mouth 2  (two) times daily.  60 tablet  0  . fluticasone (FLOVENT HFA) 44 MCG/ACT inhaler Inhale 2 puffs into the lungs 2 (two) times daily as needed. For asthma      . loratadine (CLARITIN) 10 MG tablet Take 10 mg by mouth daily.       . meloxicam (MOBIC) 15 MG tablet Take 15 mg by mouth at bedtime as needed. For pain      . montelukast (SINGULAIR) 10 MG tablet Take 10 mg by mouth at bedtime.      Marland Kitchen omeprazole (PRILOSEC) 20 MG capsule Take 20 mg by mouth at bedtime.        Review of Systems  Constitutional: Negative for fever and chills.  Cardiovascular: Positive for chest pain.  Gastrointestinal: Positive for nausea, vomiting, abdominal pain and constipation. Negative for diarrhea and blood in stool.  Genitourinary: Negative for dysuria, urgency, frequency and hematuria.       No vaginal bleeding or discharge.   Neurological: Positive for headaches.   Physical Exam   Blood pressure 160/81, pulse 68, temperature 98.4 F (36.9 C), temperature source Oral, resp. rate 18, last menstrual period 02/25/2009, SpO2 99.00%.  Physical Exam  Constitutional: She is oriented to person, place, and time. She appears well-developed and well-nourished. No distress.  Cardiovascular: Normal rate, regular rhythm and normal heart sounds.   Respiratory: Effort normal and breath sounds normal.  GI: Bowel sounds are normal. There is tenderness (worse on the right with deep palpation, left sided palpation is referred to right side, nontender to light palpation).       Slightly more distended right upper abdomen vs. Left, no obvious bulge or mass  Musculoskeletal:       Right sided superficial pain upon palpation over posterior ribs/muscles  Neurological: She is alert and oriented to person, place, and time.  Skin: Skin is warm and dry.  Psychiatric: She has a normal mood and affect.   Results for orders placed during the hospital encounter of 02/26/12 (from the past 24 hour(s))  CBC     Status: Normal    Collection Time   02/26/12  3:38 PM      Component Value Range   WBC 4.5  4.0 - 10.5 K/uL   RBC 4.92  3.87 - 5.11 MIL/uL   Hemoglobin 13.4  12.0 - 15.0 g/dL   HCT 60.4  54.0 - 98.1 %   MCV 83.1  78.0 - 100.0 fL   MCH 27.2  26.0 - 34.0 pg   MCHC 32.8  30.0 - 36.0 g/dL  RDW 13.9  11.5 - 15.5 %   Platelets 228  150 - 400 K/uL  COMPREHENSIVE METABOLIC PANEL     Status: Abnormal   Collection Time   02/26/12  3:38 PM      Component Value Range   Sodium 137  135 - 145 mEq/L   Potassium 4.1  3.5 - 5.1 mEq/L   Chloride 104  96 - 112 mEq/L   CO2 24  19 - 32 mEq/L   Glucose, Bld 96  70 - 99 mg/dL   BUN 10  6 - 23 mg/dL   Creatinine, Ser 7.82  0.50 - 1.10 mg/dL   Calcium 9.3  8.4 - 95.6 mg/dL   Total Protein 7.2  6.0 - 8.3 g/dL   Albumin 3.6  3.5 - 5.2 g/dL   AST 14  0 - 37 U/L   ALT 9  0 - 35 U/L   Alkaline Phosphatase 124 (*) 39 - 117 U/L   Total Bilirubin 0.4  0.3 - 1.2 mg/dL   GFR calc non Af Amer >90  >90 mL/min   GFR calc Af Amer >90  >90 mL/min    MAU Course  Procedures  Pt is concerned her symptoms are related to her fibroid - rev'd u/s pictures with her and discussed that her GI symptoms were unlikely to be related to her 2 cm fibroid. Encouraged f/u with PCP.   Assessment and Plan   1. Abdominal pain in female patient   2. Nausea & vomiting   3. Constipation       Medication List     As of 02/26/2012 10:04 PM    START taking these medications         metoCLOPramide 10 MG tablet   Commonly known as: REGLAN   Take 1 tablet (10 mg total) by mouth 4 (four) times daily.      polyethylene glycol packet   Commonly known as: MIRALAX / GLYCOLAX   Take 17 g by mouth daily.      CONTINUE taking these medications         albuterol 108 (90 BASE) MCG/ACT inhaler   Commonly known as: PROVENTIL HFA;VENTOLIN HFA      anastrozole 1 MG tablet   Commonly known as: ARIMIDEX      diltiazem 60 MG tablet   Commonly known as: CARDIZEM   Take 1 tablet (60 mg total) by mouth  2 (two) times daily.      fluticasone 44 MCG/ACT inhaler   Commonly known as: FLOVENT HFA      loratadine 10 MG tablet   Commonly known as: CLARITIN      meloxicam 15 MG tablet   Commonly known as: MOBIC      omeprazole 20 MG capsule   Commonly known as: PRILOSEC      SINGULAIR 10 MG tablet   Generic drug: montelukast          Where to get your medications    These are the prescriptions that you need to pick up. We sent them to a specific pharmacy, so you will need to go there to get them.   [id number] - [place], [place] - [address] AT [facility] [address]    [address] [place]  [address]    Phone: [phone]        metoCLOPramide 10 MG tablet   polyethylene glycol packet            Follow-up Information  Follow up with your primary care doctor.             Marnee Spring 02/26/2012, 4:59 PM

## 2012-02-26 NOTE — MAU Note (Signed)
Pt states she has had abdominal pain over 9 months, pt states she started vomiting off and on for about 4months and itching started having itching since the November 15th all over. Pt states she started having sharp pains in her chest today. Pt states she has had an ultrasound on the 02/22/2012.

## 2012-02-27 NOTE — Telephone Encounter (Signed)
Called pt and left message that I have results info for her.  Please leave a message stating when she can be reached or if she would like the results information left on her voice mail.  **Per Dr. Erin Fulling; pt had normal [place] with 1 tiny fibroid.  She should follow up with her PCP regarding pain and continue PT as scheduled.

## 2012-02-27 NOTE — Telephone Encounter (Signed)
Patient called and left message stating she was returning our phone call and that we could leave results on her voicemail. I called patient back, no answer- left message stating that the ultrasound she recently had done should one very small fibroid which isn't anything to be concerned about and that she could follow up with her primary care doctor for pain management and to continue her physical therapy. Also said that if she has any additional questions or concerns to please give [place] a call back at the clinic.

## 2012-03-01 ENCOUNTER — Encounter (HOSPITAL_COMMUNITY): Payer: Self-pay | Admitting: *Deleted

## 2012-03-01 ENCOUNTER — Emergency Department (HOSPITAL_COMMUNITY)
Admission: EM | Admit: 2012-03-01 | Discharge: 2012-03-01 | Disposition: A | Discharge status: Home / Self Care | Payer: Medicare Other | Attending: Emergency Medicine | Admitting: Emergency Medicine

## 2012-03-01 DIAGNOSIS — Z79899 Other long term (current) drug therapy: Secondary | ICD-10-CM | POA: Diagnosis not present

## 2012-03-01 DIAGNOSIS — Z8701 Personal history of pneumonia (recurrent): Secondary | ICD-10-CM | POA: Diagnosis not present

## 2012-03-01 DIAGNOSIS — I10 Essential (primary) hypertension: Secondary | ICD-10-CM | POA: Diagnosis not present

## 2012-03-01 DIAGNOSIS — J309 Allergic rhinitis, unspecified: Secondary | ICD-10-CM | POA: Diagnosis not present

## 2012-03-01 DIAGNOSIS — F411 Generalized anxiety disorder: Secondary | ICD-10-CM | POA: Insufficient documentation

## 2012-03-01 DIAGNOSIS — E785 Hyperlipidemia, unspecified: Secondary | ICD-10-CM | POA: Diagnosis not present

## 2012-03-01 DIAGNOSIS — R109 Unspecified abdominal pain: Secondary | ICD-10-CM | POA: Diagnosis not present

## 2012-03-01 DIAGNOSIS — Z87891 Personal history of nicotine dependence: Secondary | ICD-10-CM | POA: Insufficient documentation

## 2012-03-01 DIAGNOSIS — R112 Nausea with vomiting, unspecified: Secondary | ICD-10-CM | POA: Insufficient documentation

## 2012-03-01 DIAGNOSIS — R6889 Other general symptoms and signs: Secondary | ICD-10-CM | POA: Diagnosis not present

## 2012-03-01 DIAGNOSIS — Z8742 Personal history of other diseases of the female genital tract: Secondary | ICD-10-CM | POA: Insufficient documentation

## 2012-03-01 DIAGNOSIS — J45909 Unspecified asthma, uncomplicated: Secondary | ICD-10-CM | POA: Diagnosis not present

## 2012-03-01 DIAGNOSIS — M129 Arthropathy, unspecified: Secondary | ICD-10-CM | POA: Diagnosis not present

## 2012-03-01 DIAGNOSIS — Z853 Personal history of malignant neoplasm of breast: Secondary | ICD-10-CM | POA: Insufficient documentation

## 2012-03-01 DIAGNOSIS — Z9889 Other specified postprocedural states: Secondary | ICD-10-CM | POA: Diagnosis not present

## 2012-03-01 DIAGNOSIS — R1032 Left lower quadrant pain: Secondary | ICD-10-CM | POA: Diagnosis not present

## 2012-03-01 DIAGNOSIS — R1031 Right lower quadrant pain: Secondary | ICD-10-CM | POA: Diagnosis not present

## 2012-03-01 DIAGNOSIS — G4733 Obstructive sleep apnea (adult) (pediatric): Secondary | ICD-10-CM | POA: Diagnosis not present

## 2012-03-01 LAB — COMPREHENSIVE METABOLIC PANEL
ALT: 9 U/L (ref 0–35)
Albumin: 4 g/dL (ref 3.5–5.2)
Alkaline Phosphatase: 111 U/L (ref 39–117)
BUN: 11 mg/dL (ref 6–23)
Chloride: 101 mEq/L (ref 96–112)
Glucose, Bld: 94 mg/dL (ref 70–99)
Potassium: 4.1 mEq/L (ref 3.5–5.1)
Sodium: 137 mEq/L (ref 135–145)
Total Bilirubin: 0.9 mg/dL (ref 0.3–1.2)
Total Protein: 7.8 g/dL (ref 6.0–8.3)

## 2012-03-01 LAB — CBC WITH DIFFERENTIAL/PLATELET
Basophils Relative: 0 % (ref 0–1)
Eosinophils Absolute: 0.2 10*3/uL (ref 0.0–0.7)
Hemoglobin: 14.2 g/dL (ref 12.0–15.0)
Lymphs Abs: 1.7 10*3/uL (ref 0.7–4.0)
Monocytes Relative: 6 % (ref 3–12)
Neutro Abs: 3.4 10*3/uL (ref 1.7–7.7)
Neutrophils Relative %: 61 % (ref 43–77)
Platelets: 259 10*3/uL (ref 150–400)
RBC: 5.17 MIL/uL — ABNORMAL HIGH (ref 3.87–5.11)

## 2012-03-01 LAB — URINALYSIS, MICROSCOPIC ONLY
Hgb urine dipstick: NEGATIVE
Ketones, ur: NEGATIVE mg/dL
Specific Gravity, Urine: 1.034 — ABNORMAL HIGH (ref 1.005–1.030)
pH: 5.5 (ref 5.0–8.0)

## 2012-03-01 LAB — LIPASE, BLOOD: Lipase: 18 U/L (ref 11–59)

## 2012-03-01 MED ORDER — ONDANSETRON 8 MG PO TBDP
8.0000 mg | ORAL_TABLET | Freq: Once | ORAL | Status: AC
  Administered 2012-03-01: 8 mg via ORAL
  Filled 2012-03-01: qty 1

## 2012-03-01 MED ORDER — KETOROLAC TROMETHAMINE 30 MG/ML IJ SOLN
30.0000 mg | Freq: Once | INTRAMUSCULAR | Status: AC
  Administered 2012-03-01: 30 mg via INTRAVENOUS
  Filled 2012-03-01: qty 1

## 2012-03-01 MED ORDER — METOCLOPRAMIDE HCL 10 MG PO TABS: 10.0000 mg | ORAL_TABLET | Freq: Four times a day (QID) | ORAL | Status: DC

## 2012-03-01 NOTE — ED Notes (Signed)
Bedside report received from previous RN 

## 2012-03-01 NOTE — ED Provider Notes (Signed)
History     CSN: [id number]  Arrival date & time 03/01/12  1847   First MD Initiated Contact with Patient 03/01/12 1913      Chief Complaint  Patient presents with  . Abdominal Pain    (Consider location/radiation/quality/duration/timing/severity/associated sxs/prior treatment) HPI Comments: Pt is a AAF in her 3s who is coming in with cc of abd pain, nausea and emesis. Pt has hx of chronic abd pain, and states that she has a new nausea and emesis. Her symptoms started 2-3 days ago, and she has emesis x 2 times today, non bilious and non bloody. She has no diarrhea, last BM was today, and she denies any fevers, chills.  Pt was seen for abd pain related complain 2 times over the past 2 months - and has had a negative [place] abd, pelvis, and CT abdomen. Pt has a GI follow up - and she has seen them once, and an EGD might be entertained down the line.   Patient is a 51 y.o. female presenting with abdominal pain. The history is provided by the patient.  Abdominal Pain The primary symptoms of the illness include abdominal pain, nausea and vomiting. The primary symptoms of the illness do not include shortness of breath or dysuria.    Past Medical History  Diagnosis Date  . Uterine fibroid   . Hyperlipidemia   . Hypertension   . Asthma   . OSA (obstructive sleep apnea)   . Prolapsed uterus   . SVD (spontaneous vaginal delivery)     x 5  . Breast cancer 11/2008    left  . Seasonal allergies   . Anxiety   . Shortness of breath     occasional  . Headache     otc med prn  . Arthritis     hands, knees  . History of pneumonia     Past Surgical History  Procedure Date  . Eye surgery 1961    right  . Mastectomy 11/2008    done in [place], left  . Breast surgery 11/2008     done in [place] - breast cancer  . Cervical conization w/bx     done in [place]  . Colonoscopy   . Dilation and curettage of uterus 10/15/2011    Procedure: DILATATION AND CURETTAGE;  Surgeon: Bing Plume, MD;   Location: [facility];  Service: Gynecology;  Laterality: N/A;  Conization    Family History  Problem Relation Age of Onset  . Breast cancer Mother   . Colon cancer Mother   . Hypotension Mother   . Asthma Mother   . Diabetes type II Mother   . Arthritis Mother   . Mental illness Brother   . Heart disease Brother   . Cerebral palsy Daughter   . Emphysema Brother     never smoker  . Clotting disorder Mother     History  Substance Use Topics  . Smoking status: Former Smoker -- 0.3 packs/day for 10 years    Types: Cigarettes    Quit date: 03/26/1988  . Smokeless tobacco: Never Used  . Alcohol Use: No    OB History    Grav Para Term Preterm Abortions TAB SAB Ect Mult Living   10 5 5  5 2 3   5       Review of Systems  Constitutional: Negative for activity change.  HENT: Negative for neck pain.   Respiratory: Negative for shortness of breath.   Cardiovascular: Negative for chest pain.  Gastrointestinal: Positive for nausea, vomiting and abdominal pain.  Genitourinary: Negative for dysuria.  Neurological: Negative for headaches.    Allergies  Calcium carbonate antacid  Home Medications   Current Outpatient Rx  Name  Route  Sig  Dispense  Refill  . ALBUTEROL SULFATE HFA 108 (90 BASE) MCG/ACT IN AERS   Inhalation   Inhale 2 puffs into the lungs every 6 (six) hours as needed. For wheezing         . ANASTROZOLE 1 MG PO TABS   Oral   Take 1 mg by mouth daily. Pt says that DR. Told her to stop taking about 3 weeks ago because it agitated the fibroids         . DILTIAZEM HCL 60 MG PO TABS   Oral   Take 1 tablet (60 mg total) by mouth 2 (two) times daily.   60 tablet   0   . FLUTICASONE PROPIONATE  HFA 44 MCG/ACT IN AERO   Inhalation   Inhale 2 puffs into the lungs 2 (two) times daily as needed. For asthma         . MELOXICAM 15 MG PO TABS   Oral   Take 15 mg by mouth at bedtime as needed. For pain         . METOCLOPRAMIDE HCL 10 MG PO TABS   Oral   Take  10 mg by mouth 4 (four) times daily.         Marland Kitchen MONTELUKAST SODIUM 10 MG PO TABS   Oral   Take 10 mg by mouth at bedtime.         . OMEPRAZOLE 20 MG PO CPDR   Oral   Take 20 mg by mouth at bedtime.         Marland Kitchen POLYETHYLENE GLYCOL 3350 PO PACK   Oral   Take 17 g by mouth daily.         Marland Kitchen METOCLOPRAMIDE HCL 10 MG PO TABS   Oral   Take 1 tablet (10 mg total) by mouth every 6 (six) hours.   30 tablet   0     BP 149/86  Pulse 74  Temp 98.6 F (37 C) (Oral)  SpO2 100%  LMP 02/25/2009  Physical Exam  Nursing note and vitals reviewed. Constitutional: She is oriented to person, place, and time. She appears well-developed and well-nourished.  HENT:  Head: Normocephalic and atraumatic.  Eyes: EOM are normal. Pupils are equal, round, and reactive to light.  Neck: Neck supple. No JVD present.  Cardiovascular: Normal rate, regular rhythm and normal heart sounds.   No murmur heard. Pulmonary/Chest: Effort normal. No respiratory distress.  Abdominal: Soft. She exhibits no distension. There is no tenderness. There is no rebound and no guarding.  Neurological: She is alert and oriented to person, place, and time.  Skin: Skin is warm and dry.    ED Course  Procedures (including critical care time)  Labs Reviewed  CBC WITH DIFFERENTIAL - Abnormal; Notable for the following:    RBC 5.17 (*)     All other components within normal limits  URINALYSIS, MICROSCOPIC ONLY - Abnormal; Notable for the following:    Color, Urine AMBER (*)  BIOCHEMICALS MAY BE AFFECTED BY COLOR   APPearance CLOUDY (*)     Specific Gravity, Urine 1.034 (*)     Bilirubin Urine MODERATE (*)     Protein, ur 100 (*)     Urobilinogen, UA 2.0 (*)     Leukocytes,  UA SMALL (*)     Bacteria, UA FEW (*)     Squamous Epithelial / LPF FEW (*)     Crystals CA OXALATE CRYSTALS (*)     All other components within normal limits  COMPREHENSIVE METABOLIC PANEL  LIPASE, BLOOD   No results found.   1. Nausea and  vomiting   2. Abdominal pain       MDM  Pt comes in with cc of abd pain, nausea and emesis. Pt's exam is benign, and we have a negative [place], CT within the past 1-2 months. Basic labs show no dehydration. I discussed her findings, and informed her that she will be best served with continues PCP or GI followup at this time - as her exam is normal, labs are normal and no further test in the ER for her would be diagnostic.     Derwood Kaplan, MD 03/03/12 [phone]

## 2012-03-01 NOTE — ED Notes (Signed)
JYN:[id number]<AO> Expected date:<BR> Expected time:<BR> Means of arrival:<BR> Comments:<BR> ems abd pain

## 2012-03-01 NOTE — ED Notes (Signed)
Per ems: pt from home, hx of abd pain x1 year. Lower abd pain progressively gotten worse over past 2 weeks. Hx of uterine fibroid, prolapsed uterus. bp 150/90 pulse 80 respirations 20 even/unlabored, saO2 99% ra, pain 5/10 on abd RLQ and LLQ

## 2012-03-04 ENCOUNTER — Telehealth: Payer: Self-pay | Admitting: Internal Medicine

## 2012-03-04 NOTE — Telephone Encounter (Signed)
Patient is offered an appt for 9:45 on 03/06/12 . She accepted the appt.

## 2012-03-06 ENCOUNTER — Encounter: Payer: Self-pay | Admitting: Internal Medicine

## 2012-03-06 ENCOUNTER — Ambulatory Visit (INDEPENDENT_AMBULATORY_CARE_PROVIDER_SITE_OTHER): Payer: Medicare Other | Admitting: Internal Medicine

## 2012-03-06 VITALS — BP 120/74 | HR 64 | Ht 65.5 in | Wt 259.2 lb

## 2012-03-06 DIAGNOSIS — R1011 Right upper quadrant pain: Secondary | ICD-10-CM | POA: Diagnosis not present

## 2012-03-06 DIAGNOSIS — R112 Nausea with vomiting, unspecified: Secondary | ICD-10-CM

## 2012-03-06 NOTE — Patient Instructions (Addendum)
You have been scheduled for an endoscopy with propofol. Please follow written instructions given to you at your visit today. If you use inhalers (even only as needed) or a CPAP machine, please bring them with you on the day of your procedure.  Thank you for choosing me and [facility].  Carl E. Gessner, M.D., FACG  

## 2012-03-06 NOTE — Progress Notes (Signed)
Subjective:    Patient ID: Michaela Little, female    DOB: 06/03/1960, 51 y.o.   MRN: [id number]  HPI This lady returns. I had seen her for right lower quadrant pain and constipation issues before. She saw gynecology and she has a small fibroid she was referred for pelvic floor physical therapy. Since then she is now complaining of mostly postprandial right upper quadrant distention, pain and nausea and vomiting. She does have symptoms in between meals as well. She was in the emergency room at [facility] for this, records reviewed, workup unrevealing. Allergies  Allergen Reactions  . Calcium Carbonate Antacid Hives    Fruit flavored Tums    Outpatient Prescriptions Prior to Visit  Medication Sig Dispense Refill  . albuterol (PROVENTIL HFA;VENTOLIN HFA) 108 (90 BASE) MCG/ACT inhaler Inhale 2 puffs into the lungs every 6 (six) hours as needed. For wheezing      . anastrozole (ARIMIDEX) 1 MG tablet Take 1 mg by mouth daily. Pt says that DR. Told her to stop taking about 3 weeks ago because it agitated the fibroids      . diltiazem (CARDIZEM) 60 MG tablet Take 1 tablet (60 mg total) by mouth 2 (two) times daily.  60 tablet  0  . fluticasone (FLOVENT HFA) 44 MCG/ACT inhaler Inhale 2 puffs into the lungs 2 (two) times daily as needed. For asthma      . meloxicam (MOBIC) 15 MG tablet Take 15 mg by mouth at bedtime as needed. For pain      . metoCLOPramide (REGLAN) 10 MG tablet Take 1 tablet (10 mg total) by mouth every 6 (six) hours.  30 tablet  0  . montelukast (SINGULAIR) 10 MG tablet Take 10 mg by mouth at bedtime.      Marland Kitchen omeprazole (PRILOSEC) 20 MG capsule Take 20 mg by mouth at bedtime.      . polyethylene glycol (MIRALAX / GLYCOLAX) packet Take 17 g by mouth daily.      . [DISCONTINUED] metoCLOPramide (REGLAN) 10 MG tablet Take 10 mg by mouth 4 (four) times daily.       Last reviewed on 03/06/2012 10:10 AM by Iva Boop, MD Past Medical History  Diagnosis Date  . Uterine fibroid   .  Hyperlipidemia   . Hypertension   . Asthma   . OSA (obstructive sleep apnea)   . Prolapsed uterus   . SVD (spontaneous vaginal delivery)     x 5  . Breast cancer 11/2008    left  . Seasonal allergies   . Anxiety   . Shortness of breath     occasional  . Headache     otc med prn  . Arthritis     hands, knees  . History of pneumonia    Past Surgical History  Procedure Date  . Eye surgery 1961    right  . Mastectomy 11/2008    done in [place], left  . Breast surgery 11/2008     done in [place] - breast cancer  . Cervical conization w/bx     done in [place]  . Colonoscopy   . Dilation and curettage of uterus 10/15/2011    Procedure: DILATATION AND CURETTAGE;  Surgeon: Bing Plume, MD;  Location: [facility];  Service: Gynecology;  Laterality: N/A;  Conization    Review of Systems Positive for right infrascapular back pain and spinal pain as well.    Objective:   Physical Exam General:  NAD, obese Eyes:   anicteric  Lungs:  clear Heart:  S1S2 no rubs, murmurs or gallops Abdomen:  soft and BS+, she is mildly tender to moderately tender in the right upper quadrant with slight guarding. Ext:   no edema   Data Reviewed:   Results for orders placed during the hospital encounter of 02/13/12 [facility] emergency department  CBC AND DIFFERENTIAL  Component Value Range  WBC 4.5 (*) 4.8 - 10.8 X1000  RBC 5.18 4.2 - 5.4 X1,000,000  HEMOGLOBIN 14.2 12.0 - 16.0 G/DL  HEMATOCRIT 16.1 09.6 - 47.0 %  MCV 83.2 81.0 - 99.0 FL  MCH 27.3 27 - 31 PG  MCHC 32.8 (*) 33.0 - 37.0 G/DL  RDW 04.5 40.9 - 81.1 %  PLATELET COUNT 225 160 - 360 X1000  MPV 8.9 6.8 - 10.2 FL  NEUTROPHILS RELATIVE PERCENT 56  LYMPHOCYTES RELATIVE PERCENT 33  MONOCYTES RELATIVE PERCENT 7  BASOPHILS RELATIVE PERCENT 0  EOSINOPHILS RELATIVE PERCENT 4  NUCLEATED RED BLOOD CELLS 0 0 /100 WBC  NEUTROPHILS ABSOLUTE COUNT 2.5 1.6 - 7.3 X1000  LYMPHOCYTES ABSOLUTE COUNT 1.5 1.0 - 5.1 X1000  MONOCYTES ABSOLUTE COUNT 0.3 0.13 - 0.9  X1000  BASOPHILS ABSOLUTE COUNT 0.0 0.0 - 0.2 X1000  EOSINOPHILS ABSOLUTE COUNT 0.2 0.0 - 0.5 X1000  COMPREHENSIVE METABOLIC PANEL  Component Value Range  SODIUM 138 135 - 146 MMOL/L  POTASSIUM 4.3 3.5 - 5.3 MMOL/L  CHLORIDE 104 98 - 110 MMOL/L  CO2 28 21 - 33 MMOL/L  BUN 11 8.0 - 24.0 MG/DL  GLUCOSE 88 70 - 99 MG/DL  CREATININE 9.14 0.5 - 1.5 MG/DL  CALCIUM 9.6 8.5 - 78.2 MG/DL  PROTEIN 7.2 6.0 - 8.3 G/DL  ALBUMIN 4.4 3.5 - 5.0 G/DL  BILIRUBIN TOTAL 1.2 0.1 - 1.2 MG/DL  ALKALINE PHOSPHATASE 97 25 - 125 U/L  AST 15 5 - 40 U/L  ALT 9 5 - 50 U/L  ANION GAP 6 4 - 14  EST. GFR NON-BLACK >60  EST. GFR BLACK >60  LIPASE  Component Value Range  LIPASE 8 (*) 11 - 82 U/L  URINALYSIS WITH MICROSCOPIC  Component Value Range  U Color AMBER (*) YELLOW  U Appearance CLEAR CLEAR  U Specific Gravity 1.027 (*) 1.005 - 1.025  U pH 6.0 5.5 - 6.5  UR Prot (ALB) NEG NEG MG/DL  U Glucose NEG NEG G/DL  U Ketones NEG NEG MG/DL  U Bilirubin SMALL (*) NEG  U Blood/Hb NEG NEG  U Urobilinogen 1.0 0.1 - 1.0 EU/DL  U Nitrite NEG NEG  U Leukocytes SMALL (*) NEG  U Comment MICROSCOPIC NECESSARY  U Red Blood Cells 0-3 0 - 3 /HPF  U White Blood Cells 6-12 0 - 5 /HPF  Urine Epithelial Cells FEW FEW /HPF  Hyaline Casts 6-10 0 - 2 /LPF  POCT PREGNANCY, URINE  Component Value Range  PREGNANCY TEST URINE, POC Negative Negative, Indeterminate, Equivocal, Presumptive Negative   CT ABDOMEN PELVIS W CONTRAST (ROUTINE)  Final Result:   1. No evidence of an acute pathologic process in the abdomen or pelvis.  2. Uterine fibroids.      Assessment & Plan:   1. RUQ pain   2. Nausea and vomiting    1. I suspect a functional process is likely. She does not seem to have biliary pathology. Multiple CT scans, ultrasound here and in [place] have been unrevealing. This does not seem to be related to her pelvic issues and the gynecologist did not think she had a significant fibroid.  Her back pain could be a  part of this though should not usually trigger nausea and vomiting perhaps the pain could her prep she is having some medication side effects. 2. At this point given weeks of symptoms, and her complaints overall I think it would be prudent to exclude significant upper GI pathology. She may need a HIDA scan or perhaps a gastric emptying study but she is already on metoclopramide. The risks and benefits as well as alternatives of endoscopic procedure(s) have been discussed and reviewed. All questions answered. The patient agrees to proceed.   CC: Oneal Grout, MD

## 2012-03-07 ENCOUNTER — Encounter: Payer: Self-pay | Admitting: Internal Medicine

## 2012-03-07 ENCOUNTER — Ambulatory Visit (AMBULATORY_SURGERY_CENTER): Payer: Medicare Other | Admitting: Internal Medicine

## 2012-03-07 VITALS — BP 185/85 | HR 72 | Temp 98.3°F | Resp 16

## 2012-03-07 DIAGNOSIS — R1011 Right upper quadrant pain: Secondary | ICD-10-CM

## 2012-03-07 DIAGNOSIS — R112 Nausea with vomiting, unspecified: Secondary | ICD-10-CM

## 2012-03-07 MED ORDER — SODIUM CHLORIDE 0.9 % IV SOLN: 500.0000 mL | INTRAVENOUS | Status: DC

## 2012-03-07 NOTE — Progress Notes (Signed)
Pt. Has used albuterol inhaler in admitting, mdi x 3. BP in procedure room 220/117. Labetalol given. After 20mg  labetalol bp remains elevated. Discussed with Dr. Leone Payor and elected to cancel procedure until bp better controlled.

## 2012-03-07 NOTE — Progress Notes (Signed)
1525 Dr. Leone Payor advised that pt. Complains of soreness left side of face rates it a 2-3.  No other complaints noted.  Vital signs  Are within normal limits for this visit.    1535 Pt took her Cardiazem60mg . Per Dr. Marvell Fuller direction.

## 2012-03-07 NOTE — Progress Notes (Signed)
Patient ID: Michaela Little, female   DOB: 1960/09/05, 51 y.o.   MRN: [id number] Procedure cancelled due to elevated BP. She did not take her medications. We have discussed this and she will reschedule and take medications as prescribed on day of procedure (rescheduled).

## 2012-03-07 NOTE — Patient Instructions (Addendum)
As you know we could not do your procedure because of the high blood pressure. You do need to take your blood pressure medicines in the morning of your procedure.  We will reschedule the procedure for you.  Sorry about this but we want to be safe and prevent possible problems.  Please take your medications soon and take the second doses later tonight and get back on your regular schedule tomorrow.  Iva Boop, MD, [facility]   Refer to times for Previsit and EGD.

## 2012-03-07 NOTE — Progress Notes (Signed)
Patient ID: Michaela Little, female   DOB: [birth year], 51 y.o.   MRN: [id number] Patient c/ mild left facial pain  CN II-XII intact, speech clear, no visual change. Upper extremity strength symmetrical and intact.  She is tender with TMJ motion and pressure and over maxillary area on right.  Does have sinus problems and I suspect that is case here.  Will take her diltiazem.

## 2012-03-07 NOTE — Progress Notes (Signed)
Received pt. From procedure room at 1507.  Cancelled due to elevated BP.  Procedure rescheduled to 12/17 at 0830.  To come for previsit 12/16 at 1030AM.  Pt advised and states that these times will work for her.

## 2012-03-10 ENCOUNTER — Telehealth: Payer: Self-pay | Admitting: *Deleted

## 2012-03-10 ENCOUNTER — Ambulatory Visit (AMBULATORY_SURGERY_CENTER): Payer: Medicare Other | Admitting: *Deleted

## 2012-03-10 VITALS — Ht 65.0 in | Wt 266.4 lb

## 2012-03-10 DIAGNOSIS — R1011 Right upper quadrant pain: Secondary | ICD-10-CM

## 2012-03-10 DIAGNOSIS — R112 Nausea with vomiting, unspecified: Secondary | ICD-10-CM

## 2012-03-10 NOTE — Telephone Encounter (Signed)
Pt states her procedure had to be rescheduled and will be performed on Tuesday 03-11-2012. ewm

## 2012-03-11 ENCOUNTER — Encounter: Payer: Self-pay | Admitting: Internal Medicine

## 2012-03-11 ENCOUNTER — Ambulatory Visit (AMBULATORY_SURGERY_CENTER): Payer: Medicare Other | Admitting: Internal Medicine

## 2012-03-11 VITALS — BP 151/83 | HR 63 | Temp 96.6°F | Resp 13 | Ht 65.0 in | Wt 266.0 lb

## 2012-03-11 DIAGNOSIS — J45909 Unspecified asthma, uncomplicated: Secondary | ICD-10-CM | POA: Diagnosis not present

## 2012-03-11 DIAGNOSIS — I10 Essential (primary) hypertension: Secondary | ICD-10-CM | POA: Diagnosis not present

## 2012-03-11 DIAGNOSIS — R1011 Right upper quadrant pain: Secondary | ICD-10-CM | POA: Diagnosis not present

## 2012-03-11 DIAGNOSIS — G4733 Obstructive sleep apnea (adult) (pediatric): Secondary | ICD-10-CM | POA: Diagnosis not present

## 2012-03-11 DIAGNOSIS — R112 Nausea with vomiting, unspecified: Secondary | ICD-10-CM | POA: Diagnosis not present

## 2012-03-11 DIAGNOSIS — E669 Obesity, unspecified: Secondary | ICD-10-CM | POA: Diagnosis not present

## 2012-03-11 MED ORDER — SODIUM CHLORIDE 0.9 % IV SOLN: 500.0000 mL | INTRAVENOUS | Status: DC

## 2012-03-11 NOTE — Progress Notes (Signed)
Pt and her daughter were instructed to call her medical md and get appoint for next week re her blood pressure.  They both state they understand.  No problems voiced while in the recovery room. Maw  Patient did not experience any of the following events: a burn prior to discharge; a fall within the facility; wrong site/side/patient/procedure/implant event; or a hospital transfer or hospital admission upon discharge from the facility. [phone]) Patient did not have preoperative order for IV antibiotic SSI prophylaxis. [phone])

## 2012-03-11 NOTE — Patient Instructions (Addendum)
The esophagus stomach and duodenum(beginning of intestine) look normal.   I am going to arrange for you to have a test of your gallbladder function called a HIDA scan.  My office will contact you about this appointment.  Your blood pressure has been very high at both visits here. The first time you did not take your diltiazem but today you did. We will print out the numbers and have you take them to Dr. Glade Lloyd - you may need a change in your blood pressure medicines - call her today to make an appointment for the next 1 week. Take your blood pressure at home and take results to her if you are able (can get a reading at the drug store, I think).  Thank you for choosing me and [facility].  Iva Boop, MD, [facility]   Listed below were pt's blood pressures pre-procedure, intra-procedure and post-procedure.  Labatolol was given IV pre-procedure per Dr. Leone Payor.YOU HAD AN ENDOSCOPIC PROCEDURE TODAY AT THE [facility]: Refer to the procedure report that was given to you for any specific questions about what was found during the examination.  If the procedure report does not answer your questions, please call your gastroenterologist to clarify.  If you requested that your care partner not be given the details of your procedure findings, then the procedure report has been included in a sealed envelope for you to review at your convenience later.  YOU SHOULD EXPECT: Some feelings of bloating in the abdomen. Passage of more gas than usual.  Walking can help get rid of the air that was put into your GI tract during the procedure and reduce the bloating. If you had a lower endoscopy (such as a colonoscopy or flexible sigmoidoscopy) you may notice spotting of blood in your stool or on the toilet paper. If you underwent a bowel prep for your procedure, then you may not have a normal bowel movement for a few days.  DIET: Your first meal following the procedure should be a light meal and  then it is ok to progress to your normal diet.  A half-sandwich or bowl of soup is an example of a good first meal.  Heavy or fried foods are harder to digest and may make you feel nauseous or bloated.  Likewise meals heavy in dairy and vegetables can cause extra gas to form and this can also increase the bloating.  Drink plenty of fluids but you should avoid alcoholic beverages for 24 hours.  ACTIVITY: Your care partner should take you home directly after the procedure.  You should plan to take it easy, moving slowly for the rest of the day.  You can resume normal activity the day after the procedure however you should NOT DRIVE or use heavy machinery for 24 hours (because of the sedation medicines used during the test).    SYMPTOMS TO REPORT IMMEDIATELY: A gastroenterologist can be reached at any hour.  During normal business hours, 8:30 AM to 5:00 PM Monday through Friday, call [phone].  After hours and on weekends, please call the GI answering service at [phone] who will take a message and have the physician on call contact you.     Following upper endoscopy (EGD)  Vomiting of blood or coffee ground material  New chest pain or pain under the shoulder blades  Painful or persistently difficult swallowing  New shortness of breath  Fever of 100F or higher  Black, tarry-looking stools  FOLLOW UP: If any biopsies  were taken you will be contacted by phone or by letter within the next 1-3 weeks.  Call your gastroenterologist if you have not heard about the biopsies in 3 weeks.  Our staff will call the home number listed on your records the next business day following your procedure to check on you and address any questions or concerns that you may have at that time regarding the information given to you following your procedure. This is a courtesy call and so if there is no answer at the home number and we have not heard from you through the emergency physician on call, we will assume  that you have returned to your regular daily activities without incident.

## 2012-03-11 NOTE — Progress Notes (Signed)
Propofol given over incremental dosages 

## 2012-03-11 NOTE — Op Note (Signed)
[facility] [address] [place], [address]   ENDOSCOPY PROCEDURE REPORT  PATIENT: Michaela, Little  MR#: [id number] BIRTHDATE: 03/16/1961 , 51  yrs. old GENDER: Female ENDOSCOPIST: Iva Boop, MD, [facility] REFERRED BY:  M. Glade Lloyd, MD PROCEDURE DATE:  03/11/2012 PROCEDURE:  EGD, diagnostic ASA CLASS:     Class III INDICATIONS:  abdominal pain in the upper right quadrant. Vomiting.   Nausea. MEDICATIONS: MAC sedation, administered by CRNA, These medications were titrated to patient response per physician's verbal order, propofol (Diprivan) 200mg  IV, and Robinul 0.2 mg IV TOPICAL ANESTHETIC: Cetacaine Spray  DESCRIPTION OF PROCEDURE: After the risks benefits and alternatives of the procedure were thoroughly explained, informed consent was obtained.  The LB GIF-H180 [id number] endoscope was introduced through the mouth and advanced to the second portion of the duodenum. Without limitations.  The instrument was slowly withdrawn as the mucosa was fully examined.      The upper, middle and distal third of the esophagus were carefully inspected and no abnormalities were noted.  The z-line was well seen at the GEJ.  The endoscope was pushed into the fundus which was normal including a retroflexed view.  The antrum, gastric body, first and second part of the duodenum were unremarkable. Retroflexed views revealed no abnormalities.     The scope was then withdrawn from the patient and the procedure completed.  COMPLICATIONS: There were no complications. ENDOSCOPIC IMPRESSION: Normal EGD  RECOMMENDATIONS: 1.  My office will arrange for you to have a HIDA scan with ejection fraction performed.  This is a radiology test which gives [place] an idea of how well your gallbladder functions. 2.   Please see PCP about elevated BP within 1 week   eSigned:  Iva Boop, MD, [facility] 03/11/2012 8:59 AM   CC:The Patient  and M. Glade Lloyd, MD

## 2012-03-12 ENCOUNTER — Other Ambulatory Visit: Payer: Self-pay | Admitting: Gastroenterology

## 2012-03-12 ENCOUNTER — Telehealth: Payer: Self-pay

## 2012-03-12 ENCOUNTER — Telehealth: Payer: Self-pay | Admitting: Gastroenterology

## 2012-03-12 DIAGNOSIS — R112 Nausea with vomiting, unspecified: Secondary | ICD-10-CM

## 2012-03-12 DIAGNOSIS — R1011 Right upper quadrant pain: Secondary | ICD-10-CM

## 2012-03-12 NOTE — Telephone Encounter (Signed)
Spoke to pt told her she is scheduled for a Gastric emptying scan at [facility] on 02/2012 @ 10:30 to arrive at 10:15 and report to radiology. Pt stated understanding

## 2012-03-12 NOTE — Telephone Encounter (Signed)
  Follow up Call-  Call back number 03/11/2012 03/07/2012  Post procedure Call Back phone  # [phone]  Permission to leave phone message Yes Yes     Patient questions:  Do you have a fever, pain , or abdominal swelling? no Pain Score  0 *  Have you tolerated food without any problems? yes  Have you been able to return to your normal activities? yes  Do you have any questions about your discharge instructions: Diet   no Medications  no Follow up visit  no  Do you have questions or concerns about your Care? no  Actions: * If pain score is 4 or above: No action needed, pain <4.  I woke the pt up.  She said she got a appointment with her medical MD Thursday.  Pt also said "I am going to the bathroom a lot".  I asked her to explain and she said she was urinating a lot.  I advised her if it continues to check with her medical doctor.  Maw

## 2012-03-13 ENCOUNTER — Ambulatory Visit (HOSPITAL_BASED_OUTPATIENT_CLINIC_OR_DEPARTMENT_OTHER): Payer: Medicare Other | Admitting: Oncology

## 2012-03-13 ENCOUNTER — Telehealth: Payer: Self-pay | Admitting: *Deleted

## 2012-03-13 ENCOUNTER — Other Ambulatory Visit: Payer: Medicare Other | Admitting: Lab

## 2012-03-13 ENCOUNTER — Ambulatory Visit: Payer: Medicare Other | Admitting: Obstetrics & Gynecology

## 2012-03-13 ENCOUNTER — Ambulatory Visit: Payer: Medicare Other | Admitting: Physician Assistant

## 2012-03-13 ENCOUNTER — Encounter: Payer: Self-pay | Admitting: Oncology

## 2012-03-13 VITALS — BP 144/85 | HR 72 | Temp 98.2°F | Resp 20 | Ht 65.0 in | Wt 262.1 lb

## 2012-03-13 DIAGNOSIS — I890 Lymphedema, not elsewhere classified: Secondary | ICD-10-CM

## 2012-03-13 DIAGNOSIS — C50919 Malignant neoplasm of unspecified site of unspecified female breast: Secondary | ICD-10-CM

## 2012-03-13 DIAGNOSIS — I10 Essential (primary) hypertension: Secondary | ICD-10-CM | POA: Diagnosis not present

## 2012-03-13 DIAGNOSIS — R109 Unspecified abdominal pain: Secondary | ICD-10-CM | POA: Diagnosis not present

## 2012-03-13 DIAGNOSIS — Z853 Personal history of malignant neoplasm of breast: Secondary | ICD-10-CM

## 2012-03-13 NOTE — Progress Notes (Signed)
ID: Michaela Little   DOB: 1960-09-30  MR#: [id number]  WUJ#:[id number]  PCP: Oneal Grout, MD SU: GYN: Sabas Sous, MD OTHER MD: Etter Sjogren, Stan Head, Sandrea Hughs  HISTORY OF PRESENT ILLNESS: Michaela Little is a [place] woman who moved to [facility] in 2013 with a history of breast cancer, and establishied herself with our practice.  The patient had left breast and left axillary lymph node biopsy performed March of 2010, both positive (I do not have the pathology report at this point). She was treated neo-adjuvantly at [facility] with (a) paclitaxel weekly x12 and (b) doxorubicin/cyclophosphamide in dose dense fashion x4, both given with tifiparnib.   She then proceeded to left modified radical mastectomy 12/23/2008, the final pathology showing a 4 cm residual invasive lobular carcinoma, involving 5/22 lymph nodes sampled. The tumor was estrogen and progesterone receptor positive, HER-2 negative.   Postoperatively she received 50 gray of radiation completed January of 2011, including the left supraclavicular lymph node basin. She was then started on tamoxifen, but developed some uterine lining thickening and was switched to anastrozole in May 2012. Her subsequent history is as detailed below  INTERVAL HISTORY: Michaela Little returns today for followup of her left breast carcinoma. Interval history is remarkable for Michaela Little having discontinued her anastrozole in November due to some severe abdominal pain that she thought might be associated. Unfortunately, the pain has still not resolved. She is in the process of having extensive workup by Dr. Leone Payor, and in fact is scheduled for a HIDA tomorrow.   Michaela Little continues to have multiple health concerns and comorbidities. She sees Dr. Erin Fulling for GYN care and is being evaluated for pelvic pain and apparent fibroids.  She is followed by Dr. Glade Lloyd for primary care.    REVIEW OF SYSTEMS: Michaela Little denies any recent fevers or chills. She  has no hot flashes. She's had no vaginal dryness or vaginal bleeding. She denies abnormal bleeding elsewhere. She has chronic nausea, along with the abdominal pain noted above.. She's having regular bowel movements. She denies any cough or increased shortness of breath has had no chest pain, although she does feel some "fluttering" of her heart on occasion. She does have some sinus congestion. She had a swollen lymph node on the left side of her neck last week but that has resolved. She has chronic back pain and joint pain which is stable.  She has pain in the left chest wall around the expander.  A detailed review of systems is stable noncontributory.    PAST MEDICAL HISTORY: Past Medical History  Diagnosis Date  . Uterine fibroid   . Hyperlipidemia   . Hypertension   . Asthma   . OSA (obstructive sleep apnea)   . SVD (spontaneous vaginal delivery)     x 5  . Breast cancer 11/2008    left  . Seasonal allergies   . Anxiety   . Shortness of breath     occasional  . Headache     otc med prn  . Arthritis     hands, knees  . History of pneumonia     PAST SURGICAL HISTORY: Past Surgical History  Procedure Date  . Eye surgery 1961    right  . Mastectomy 11/2008    done in [place], left  . Breast surgery 11/2008     done in [place] - breast cancer  . Cervical conization w/bx     done in [place]  . Colonoscopy 2011    normal per patient - [place]  .  Dilation and curettage of uterus 10/15/2011    Procedure: DILATATION AND CURETTAGE;  Surgeon: Bing Plume, MD;  Location: [facility];  Service: Gynecology;  Laterality: N/A;  Conization    FAMILY HISTORY Family History  Problem Relation Age of Onset  . Breast cancer Mother   . Colon cancer Mother   . Hypotension Mother   . Asthma Mother   . Diabetes type II Mother   . Arthritis Mother   . Clotting disorder Mother   . Mental illness Brother   . Heart disease Brother   . Cerebral palsy Daughter   . Emphysema Brother     never smoker  . Colon  cancer Maternal Aunt   . Colon cancer Maternal Uncle    Gynecologic history: She is GX P5. At first pregnancy to term age 44. Menarche age 56. Last menstrual period when she had her chemotherapy.   Social History: She is disabled secondary to arthritis. She moved from [place] to [place] for "peace of mind". Of her 5 children only one daughter is living in [place], [place]. One daughter in [place] is incarcerated and another one is "in a lot of stress". The patient lives alone. She mostly reads and does computer games on her phone during the day. She tells me she is starting at a stretching program and would eventually like to start an exercise program. She has 20 grandchildren.    ADVANCED DIRECTIVES: She does not have a healthcare power of attorney.    HEALTH MAINTENANCE: History  Substance Use Topics  . Smoking status: Former Smoker -- 0.3 packs/day for 10 years    Types: Cigarettes    Quit date: 03/26/1988  . Smokeless tobacco: Never Used  . Alcohol Use: No     Colonoscopy:  PAP:  Bone density:  Lipid panel:  Allergies  Allergen Reactions  . Calcium Carbonate Antacid Hives    Fruit flavored Tums     Current Outpatient Prescriptions  Medication Sig Dispense Refill  . albuterol (PROVENTIL HFA;VENTOLIN HFA) 108 (90 BASE) MCG/ACT inhaler Inhale 2 puffs into the lungs every 6 (six) hours as needed. For wheezing      . amLODipine (NORVASC) 10 MG tablet Take 10 mg by mouth daily.      . cephALEXin (KEFLEX) 500 MG capsule 500 mg. Take 500 mg by mouth 4 times daily.      . fluticasone (FLOVENT HFA) 44 MCG/ACT inhaler Inhale 2 puffs into the lungs 2 (two) times daily as needed. For asthma      . loratadine (CLARITIN) 10 MG tablet 10 mg. Take 10 mg by mouth daily.      . meloxicam (MOBIC) 15 MG tablet Take 15 mg by mouth at bedtime as needed. For pain      . metoCLOPramide (REGLAN) 10 MG tablet Take 1 tablet (10 mg total) by mouth every 6 (six) hours.  30 tablet  0  .  montelukast (SINGULAIR) 10 MG tablet Take 10 mg by mouth at bedtime.      Marland Kitchen omeprazole (PRILOSEC) 20 MG capsule Take 20 mg by mouth at bedtime.      . polyethylene glycol (MIRALAX / GLYCOLAX) packet Take 17 g by mouth daily.      Marland Kitchen anastrozole (ARIMIDEX) 1 MG tablet Take 1 mg by mouth daily. Pt says that DR. Told her to stop taking about 3 weeks ago because it agitated the fibroids      . Calcium 500-125 MG-UNIT TABS 1 tablet. Take  1 tablet by mouth 2 times daily.      . furosemide (LASIX) 40 MG tablet 40 mg. Take 40 mg by mouth daily as needed.      Marland Kitchen HYDROcodone-acetaminophen (NORCO/VICODIN) 5-325 MG per tablet 1 tablet. Take 1 tablet by mouth every 6 (six) hours as needed for Pain.      . ondansetron (ZOFRAN ODT) 8 MG disintegrating tablet 8 mg. Take 1 tablet (8 mg total) by mouth every 8 (eight) hours as needed for Nausea.        OBJECTIVE: Middle-aged [place] American woman who  Appears uncomfortable but in no acute distress. Filed Vitals:   03/13/12 1240  BP: 144/85  Pulse: 72  Temp: 98.2 F (36.8 C)  Resp: 20     Body mass index is 43.62 kg/(m^2).    ECOG FS: 1 Filed Weights   03/13/12 1240  Weight: 262 lb 1.6 oz (118.888 kg)    Sclerae unicteric Oropharynx clear No cervical or supraclavicular adenopathy Lungs no rales or rhonchi Heart regular rate and rhythm, no murmur appreciated Abdomen obese, tender to palpation in the right upper and lower quadrants Positive bowel sounds MSK no focal spinal tenderness Neuro: nonfocal, alert and oriented x3 Breasts: Right breast is unremarkable. Patient status post left mastectomy with expander in place. There is no evidence of disease recurrence. Axillae are benign bilaterally with no palpable adenopathy    LAB RESULTS: Lab Results  Component Value Date   WBC 5.5 03/01/2012   NEUTROABS 3.4 03/01/2012   HGB 14.2 03/01/2012   HCT 42.4 03/01/2012   MCV 82.0 03/01/2012   PLT 259 03/01/2012      Chemistry      Component Value  Date/Time   NA 137 03/01/2012 2134   NA 138 01/17/2012 1034   K 4.1 03/01/2012 2134   K 4.3 01/17/2012 1034   CL 101 03/01/2012 2134   CL 106 01/17/2012 1034   CO2 27 03/01/2012 2134   CO2 24 01/17/2012 1034   BUN 11 03/01/2012 2134   BUN 11.0 01/17/2012 1034   CREATININE 0.77 03/01/2012 2134   CREATININE 0.7 01/17/2012 1034      Component Value Date/Time   CALCIUM 9.7 03/01/2012 2134   CALCIUM 9.7 01/17/2012 1034   ALKPHOS 111 03/01/2012 2134   ALKPHOS 139 01/17/2012 1034   AST 17 03/01/2012 2134   AST 16 01/17/2012 1034   ALT 9 03/01/2012 2134   ALT 12 01/17/2012 1034   BILITOT 0.9 03/01/2012 2134   BILITOT 0.80 01/17/2012 1034       Lab Results  Component Value Date   LABCA2 19 01/17/2012     STUDIES: US Transvaginal Non-ob  03/17/2012  *RADIOLOGY REPORT*  Clinical Data: Pelvic pain. Personal history of breast carcinoma.  TRANSABDOMINAL AND TRANSVAGINAL ULTRASOUND OF PELVIS  Technique:  Both transabdominal and transvaginal ultrasound examinations of the pelvis were performed.  Transabdominal technique was performed for global imaging of the pelvis including uterus, ovaries, adnexal regions, and pelvic cul-de-sac.  It was necessary to proceed with endovaginal exam following the transabdominal exam to visualize the endometrium and ovaries.  Comparison:  CT on 01/16/2012  Findings: Uterus:  8.3 x 5.0 x 6.4 cm.  A subserosal fibroid is seen in the right posterior fundal region measuring 2.8 x 2.7 x 2.7 cm.  No other fibroids identified.  Endometrium: Double layer thickness measures 4 mm transvaginally. Trace fluid noted in the endometrial cavity.  No focal lesions visualized.  Right ovary: not directly visualized  by transabdominal or transvaginal sonography, however no adnexal mass identified.  Left ovary: 1.9 x 1.6 x 1.7 cm.  Normal appearance.  No adnexal mass identified.  Other Findings:  No free fluid  IMPRESSION:  1.  2.8 cm subserosal fibroid in the right posterior uterine fundus. 2.   Endometrial thickness measures 4 mm.  No evidence of hematometros. 3.  Normal appearance of left ovary.  Nonvisualization of right ovary, however no adnexal mass identified.   Original Report Authenticated By: Myles Rosenthal, M.D.    US Pelvis Complete  02/22/2012  *RADIOLOGY REPORT*  Clinical Data: Pelvic pain. Personal history of breast carcinoma.  TRANSABDOMINAL AND TRANSVAGINAL ULTRASOUND OF PELVIS  Technique:  Both transabdominal and transvaginal ultrasound examinations of the pelvis were performed.  Transabdominal technique was performed for global imaging of the pelvis including uterus, ovaries, adnexal regions, and pelvic cul-de-sac.  It was necessary to proceed with endovaginal exam following the transabdominal exam to visualize the endometrium and ovaries.  Comparison:  CT on 01/16/2012  Findings: Uterus:  8.3 x 5.0 x 6.4 cm.  A subserosal fibroid is seen in the right posterior fundal region measuring 2.8 x 2.7 x 2.7 cm.  No other fibroids identified.  Endometrium: Double layer thickness measures 4 mm transvaginally. Trace fluid noted in the endometrial cavity.  No focal lesions visualized.  Right ovary: not directly visualized by transabdominal or transvaginal sonography, however no adnexal mass identified.  Left ovary: 1.9 x 1.6 x 1.7 cm.  Normal appearance.  No adnexal mass identified.  Other Findings:  No free fluid  IMPRESSION:  1.  2.8 cm subserosal fibroid in the right posterior uterine fundus. 2.  Endometrial thickness measures 4 mm.  No evidence of hematometros. 3.  Normal appearance of left ovary.  Nonvisualization of right ovary, however no adnexal mass identified.   Original Report Authenticated By: Myles Rosenthal, M.D.     ASSESSMENT: 51 year old [place] woman relocated here from [place],    (1)  left-sided breast cancer diagnosed March of 2010 and treated neo-adjuvantly at [facility] with (a) paclitaxel weekly x12 and (b) doxorubicin/cyclophosphamide in dose dense fashion x4, both given  with tifiparnib.   (2) definitive left modified radical mastectomy September of 2010 for a T2 N2 or stage IIIA invasive lobular breast cancer, estrogen and progesterone receptor positive, HER-2 negative,   (3)  post mastectomy radiation completed January of 2011,  (4) briefly on tamoxifen, discontinued it because of uterine lining concerns.  On anastrozole since May 2012 with good tolerance. Held between November and December 2013 due to GI issues, unrelated to cancer or treatment.  (5)  Other problems include left upper extremity lymphedema, borderline concern regarding genetic family history, history of cervical dysplasia, and history of dense breasts.  PLAN: Although Michaela Little has multiple health concerns (all of which appear to be followed appropriately by her other physicians), with regards to her breast cancer she is doing well.   The abdominal pain and GI issues do not appear to have been associated with the anastrozole, and she will resume that medication. We are also referring her back to Dr. Odis Luster to discuss her final breast reconstruction.  Otherwise Michaela Little is due for her next right mammogram in April, after which we will see her here for repeat labs and followup exam. She voices understanding and agreement with this plan and will call with any changes or problems.    Wing Schoch    03/13/2012

## 2012-03-13 NOTE — Telephone Encounter (Signed)
Changed the patient provider to see magrinat  Left voice message to inform the patient of the new date and time of the dr.bowers 04-02-2012 at 1:30pm

## 2012-03-14 ENCOUNTER — Telehealth: Payer: Self-pay

## 2012-03-14 ENCOUNTER — Ambulatory Visit (HOSPITAL_COMMUNITY)
Admission: RE | Admit: 2012-03-14 | Discharge: 2012-03-14 | Disposition: A | Discharge status: Home / Self Care | Payer: Medicare Other | Source: Ambulatory Visit | Attending: Internal Medicine | Admitting: Internal Medicine

## 2012-03-14 DIAGNOSIS — R1011 Right upper quadrant pain: Secondary | ICD-10-CM

## 2012-03-14 DIAGNOSIS — R112 Nausea with vomiting, unspecified: Secondary | ICD-10-CM

## 2012-03-14 NOTE — Telephone Encounter (Signed)
Radiology called over , patient was questioning what test she was having this AM.  It was suppose to be a HIDA scan, gastric emptying entered in error.  Apologized to patient for the error and will set up HIDA scan asap.  Set up for 03/18/12 at 7 am, arrive at 6:45 am to [facility] on the first floor of the hospital.  Patient told NPO midnight and to hold meds that am. She verbalized understanding.

## 2012-03-16 ENCOUNTER — Emergency Department (HOSPITAL_COMMUNITY)
Admission: EM | Admit: 2012-03-16 | Discharge: 2012-03-16 | Disposition: A | Discharge status: Home / Self Care | Payer: Medicare Other | Attending: Emergency Medicine | Admitting: Emergency Medicine

## 2012-03-16 ENCOUNTER — Emergency Department (HOSPITAL_COMMUNITY): Payer: Medicare Other

## 2012-03-16 ENCOUNTER — Encounter (HOSPITAL_COMMUNITY): Payer: Self-pay | Admitting: Nurse Practitioner

## 2012-03-16 ENCOUNTER — Other Ambulatory Visit: Payer: Self-pay

## 2012-03-16 DIAGNOSIS — J45909 Unspecified asthma, uncomplicated: Secondary | ICD-10-CM | POA: Insufficient documentation

## 2012-03-16 DIAGNOSIS — Z8669 Personal history of other diseases of the nervous system and sense organs: Secondary | ICD-10-CM | POA: Diagnosis not present

## 2012-03-16 DIAGNOSIS — I10 Essential (primary) hypertension: Secondary | ICD-10-CM

## 2012-03-16 DIAGNOSIS — R509 Fever, unspecified: Secondary | ICD-10-CM | POA: Diagnosis not present

## 2012-03-16 DIAGNOSIS — Z8739 Personal history of other diseases of the musculoskeletal system and connective tissue: Secondary | ICD-10-CM | POA: Insufficient documentation

## 2012-03-16 DIAGNOSIS — Z79899 Other long term (current) drug therapy: Secondary | ICD-10-CM | POA: Insufficient documentation

## 2012-03-16 DIAGNOSIS — Z853 Personal history of malignant neoplasm of breast: Secondary | ICD-10-CM | POA: Insufficient documentation

## 2012-03-16 DIAGNOSIS — Z87891 Personal history of nicotine dependence: Secondary | ICD-10-CM | POA: Insufficient documentation

## 2012-03-16 DIAGNOSIS — R51 Headache: Secondary | ICD-10-CM | POA: Diagnosis not present

## 2012-03-16 DIAGNOSIS — Z8742 Personal history of other diseases of the female genital tract: Secondary | ICD-10-CM | POA: Diagnosis not present

## 2012-03-16 DIAGNOSIS — Z8701 Personal history of pneumonia (recurrent): Secondary | ICD-10-CM | POA: Diagnosis not present

## 2012-03-16 DIAGNOSIS — E785 Hyperlipidemia, unspecified: Secondary | ICD-10-CM | POA: Insufficient documentation

## 2012-03-16 LAB — CBC WITH DIFFERENTIAL/PLATELET
Basophils Absolute: 0 10*3/uL (ref 0.0–0.1)
Eosinophils Relative: 4 % (ref 0–5)
HCT: 42.6 % (ref 36.0–46.0)
Lymphocytes Relative: 34 % (ref 12–46)
MCV: 82.4 fL (ref 78.0–100.0)
Monocytes Absolute: 0.4 10*3/uL (ref 0.1–1.0)
Monocytes Relative: 7 % (ref 3–12)
RDW: 13.8 % (ref 11.5–15.5)
WBC: 5.5 10*3/uL (ref 4.0–10.5)

## 2012-03-16 LAB — COMPREHENSIVE METABOLIC PANEL
AST: 22 U/L (ref 0–37)
BUN: 11 mg/dL (ref 6–23)
CO2: 25 mEq/L (ref 19–32)
Calcium: 9.5 mg/dL (ref 8.4–10.5)
Creatinine, Ser: 0.75 mg/dL (ref 0.50–1.10)
GFR calc Af Amer: >90 mL/min (ref >90)
GFR calc non Af Amer: >90 mL/min (ref >90)
Glucose, Bld: 90 mg/dL (ref 70–99)

## 2012-03-16 LAB — URINALYSIS, ROUTINE W REFLEX MICROSCOPIC
Ketones, ur: NEGATIVE mg/dL
Leukocytes, UA: NEGATIVE
Nitrite: NEGATIVE
Protein, ur: NEGATIVE mg/dL
Urobilinogen, UA: 1 mg/dL (ref 0.0–1.0)

## 2012-03-16 LAB — TROPONIN I: Troponin I: <0.3 ng/mL (ref <0.30)

## 2012-03-16 MED ORDER — DIPHENHYDRAMINE HCL 50 MG/ML IJ SOLN
25.0000 mg | Freq: Once | INTRAMUSCULAR | Status: AC
  Administered 2012-03-16: 25 mg via INTRAVENOUS
  Filled 2012-03-16: qty 1

## 2012-03-16 MED ORDER — KETOROLAC TROMETHAMINE 30 MG/ML IJ SOLN
30.0000 mg | Freq: Once | INTRAMUSCULAR | Status: AC
  Administered 2012-03-16: 30 mg via INTRAVENOUS
  Filled 2012-03-16: qty 1

## 2012-03-16 MED ORDER — METOCLOPRAMIDE HCL 5 MG/ML IJ SOLN
10.0000 mg | Freq: Once | INTRAMUSCULAR | Status: AC
  Administered 2012-03-16: 10 mg via INTRAVENOUS
  Filled 2012-03-16: qty 2

## 2012-03-16 MED ORDER — LABETALOL HCL 5 MG/ML IV SOLN
20.0000 mg | Freq: Once | INTRAVENOUS | Status: AC
  Administered 2012-03-16: 20 mg via INTRAVENOUS
  Filled 2012-03-16: qty 4

## 2012-03-16 NOTE — ED Provider Notes (Signed)
History     CSN: [id number]  Arrival date & time 03/16/12  1440   First MD Initiated Contact with Patient 03/16/12 1608      Chief Complaint  Patient presents with  . Hypertension    (Consider location/radiation/quality/duration/timing/severity/associated sxs/prior treatment) HPI Comments: History pain presenting with headache the onset during church this morning. She also had pain in her left arm prior to the headache. She went to [facility] and found her blood pressure was elevated 190/110. Her medication was changed about 4 days ago to amlodipine. She denies any chest pain or shortness of breath. She states the headache gradually onset and is worse now than when it started. She denies any visual change she feels pressure behind her eyes. No nausea, vomiting. Her abdominal pain is at baseline.  She is scheduled for a HIDA scan 12/24. No weakness, numbness or tingling. States compliance with her blood pressure medications.  The history is provided by the patient and a relative.    Past Medical History  Diagnosis Date  . Uterine fibroid   . Hyperlipidemia   . Hypertension   . Asthma   . OSA (obstructive sleep apnea)   . SVD (spontaneous vaginal delivery)     x 5  . Breast cancer 11/2008    left  . Seasonal allergies   . Anxiety   . Shortness of breath     occasional  . Headache     otc med prn  . Arthritis     hands, knees  . History of pneumonia     Past Surgical History  Procedure Date  . Eye surgery 1961    right  . Mastectomy 11/2008    done in [place], left  . Breast surgery 11/2008     done in [place] - breast cancer  . Cervical conization w/bx     done in [place]  . Colonoscopy 2011    normal per patient - [place]  . Dilation and curettage of uterus 10/15/2011    Procedure: DILATATION AND CURETTAGE;  Surgeon: Bing Plume, MD;  Location: [facility];  Service: Gynecology;  Laterality: N/A;  Conization    Family History  Problem Relation Age of Onset  . Breast cancer Mother    . Colon cancer Mother   . Hypotension Mother   . Asthma Mother   . Diabetes type II Mother   . Arthritis Mother   . Clotting disorder Mother   . Mental illness Brother   . Heart disease Brother   . Cerebral palsy Daughter   . Emphysema Brother     never smoker  . Colon cancer Maternal Aunt   . Colon cancer Maternal Uncle     History  Substance Use Topics  . Smoking status: Former Smoker -- 0.3 packs/day for 10 years    Types: Cigarettes    Quit date: 03/26/1988  . Smokeless tobacco: Never Used  . Alcohol Use: No    OB History    Grav Para Term Preterm Abortions TAB SAB Ect Mult Living   10 5 5  5 2 3   5       Review of Systems  Constitutional: Positive for fever. Negative for activity change.  HENT: Negative for congestion and rhinorrhea.   Eyes: Negative for visual disturbance.  Respiratory: Negative for cough, chest tightness and shortness of breath.   Cardiovascular: Negative for chest pain.  Gastrointestinal: Negative for nausea, vomiting and abdominal pain.  Genitourinary: Negative for dysuria, vaginal bleeding and vaginal  discharge.  Musculoskeletal: Negative for back pain.  Neurological: Positive for headaches. Negative for weakness and light-headedness.  A complete 10 system review of systems was obtained and all systems are negative except as noted in the HPI and PMH.    Allergies  Calcium carbonate antacid  Home Medications   Current Outpatient Rx  Name  Route  Sig  Dispense  Refill  . ALBUTEROL SULFATE HFA 108 (90 BASE) MCG/ACT IN AERS   Inhalation   Inhale 2 puffs into the lungs every 6 (six) hours as needed. For wheezing         . AMLODIPINE BESYLATE 10 MG PO TABS   Oral   Take 10 mg by mouth daily.         Marland Kitchen CALCIUM 500-125 MG-UNIT PO TABS   Oral   Take 1 tablet by mouth 2 (two) times daily.          Marland Kitchen FLUTICASONE PROPIONATE  HFA 44 MCG/ACT IN AERO   Inhalation   Inhale 2 puffs into the lungs 2 (two) times daily as needed. For  asthma         . FUROSEMIDE 40 MG PO TABS   Oral   Take 40 mg by mouth daily as needed. For leg swelling         . LORATADINE 10 MG PO TABS   Oral   Take 10 mg by mouth daily.          . MELOXICAM 15 MG PO TABS   Oral   Take 15 mg by mouth at bedtime as needed. For pain         . METOCLOPRAMIDE HCL 10 MG PO TABS   Oral   Take 10 mg by mouth every 4 (four) hours.         Marland Kitchen MONTELUKAST SODIUM 10 MG PO TABS   Oral   Take 10 mg by mouth at bedtime.         . OMEPRAZOLE 20 MG PO CPDR   Oral   Take 20 mg by mouth at bedtime.         Marland Kitchen POLYETHYLENE GLYCOL 3350 PO PACK   Oral   Take 17 g by mouth daily as needed. For constipation           BP 135/77  Pulse 67  Temp 98.1 F (36.7 C) (Oral)  Resp 14  SpO2 97%  LMP 02/25/2009  Physical Exam  Constitutional: She is oriented to person, place, and time. She appears well-developed and well-nourished. No distress.  HENT:  Head: Normocephalic and atraumatic.  Mouth/Throat: Oropharynx is clear and moist. No oropharyngeal exudate.  Eyes: Conjunctivae normal and EOM are normal. Pupils are equal, round, and reactive to light.  Neck: Normal range of motion. Neck supple.       No meningismus  Cardiovascular: Normal rate, regular rhythm and normal heart sounds.   No murmur heard. Pulmonary/Chest: Effort normal and breath sounds normal. No respiratory distress.  Abdominal: Soft. Bowel sounds are normal. There is no tenderness. There is no rebound and no guarding.  Musculoskeletal: Normal range of motion. She exhibits no edema and no tenderness.  Neurological: She is alert and oriented to person, place, and time. No cranial nerve deficit. She exhibits normal muscle tone. Coordination normal.       CN 2 to 12 intact, 5 out of 5 strength throughout, no ataxia finger to nose, visual fields full to confrontation bilaterally  Skin: Skin is warm.  ED Course  Procedures (including critical care time)  Labs Reviewed  CBC  WITH DIFFERENTIAL - Abnormal; Notable for the following:    RBC 5.17 (*)     All other components within normal limits  COMPREHENSIVE METABOLIC PANEL  TROPONIN I  URINALYSIS, ROUTINE W REFLEX MICROSCOPIC   Ct Head Wo Contrast  03/16/2012  *RADIOLOGY REPORT*  Clinical Data: Hypertension, headache, pressure visualized, past history breast cancer with left mastectomy  CT HEAD WITHOUT CONTRAST  Technique:  Contiguous axial images were obtained from the base of the skull through the vertex without contrast.  Comparison: None.  Findings: Normal ventricular morphology. No midline shift or mass effect. Normal appearance of brain parenchyma. No intracranial hemorrhage, mass lesion or evidence of acute infarction. No extra-axial fluid collections. Bones unremarkable. Mucosal thickening left maxillary sinus apex.  IMPRESSION: No acute intracranial abnormalities.   Original Report Authenticated By: Ulyses Southward, M.D.      1. Hypertension   2. Headache       MDM  Gradual onset headache with elevated blood pressure. Transient left forearm pain that has resolved. No chest pain or shortness of breath.  Nonfocal neurological exam. Patient is given dose of IV labetalol for elevated blood pressure. Normal gait. Labs unremarkable. Headache is improved and blood pressure has improved.  Doubt ACS, PE, TIA or CVA. Patient encouraged to continue her blood pressure medications as prescribed. Followup with her Dr. this week.   Date: 03/16/2012  Rate: 66  Rhythm: normal sinus rhythm  QRS Axis: normal  Intervals: normal  ST/T Wave abnormalities: normal  Conduction Disutrbances:none  Narrative Interpretation:   Old EKG Reviewed: unchanged          Glynn Octave, MD 03/17/12 0008

## 2012-03-16 NOTE — ED Notes (Addendum)
Pt reports she started a new pill yesterday for htn over past weeks. History of htn that had become uncontrolled with diltiazem. Pt reports bp is still elevated today despite the new pill. C/o headache and pressure between eyes throughout the day today. A&Ox4, resp e/u

## 2012-03-16 NOTE — ED Notes (Signed)
Patient transported to CT 

## 2012-03-16 NOTE — ED Notes (Signed)
Patient given [place] sandwich, apple sauce, crackers, and a coca[employer]cola.  Currently pending for discharge paperwork from EDP.  Explained and apologized about delay in paperwork (EDP is currently tied up with a critical patient).  Patient verbalized understanding.

## 2012-03-18 ENCOUNTER — Encounter (HOSPITAL_COMMUNITY)
Admission: RE | Admit: 2012-03-18 | Discharge: 2012-03-18 | Disposition: A | Discharge status: Home / Self Care | Payer: Medicare Other | Source: Ambulatory Visit | Attending: Internal Medicine | Admitting: Internal Medicine

## 2012-03-18 DIAGNOSIS — R112 Nausea with vomiting, unspecified: Secondary | ICD-10-CM | POA: Insufficient documentation

## 2012-03-18 DIAGNOSIS — R1011 Right upper quadrant pain: Secondary | ICD-10-CM | POA: Insufficient documentation

## 2012-03-21 DIAGNOSIS — J387 Other diseases of larynx: Secondary | ICD-10-CM | POA: Diagnosis not present

## 2012-03-27 DIAGNOSIS — E785 Hyperlipidemia, unspecified: Secondary | ICD-10-CM | POA: Diagnosis not present

## 2012-03-27 DIAGNOSIS — I5030 Unspecified diastolic (congestive) heart failure: Secondary | ICD-10-CM | POA: Diagnosis not present

## 2012-03-27 DIAGNOSIS — E559 Vitamin D deficiency, unspecified: Secondary | ICD-10-CM | POA: Diagnosis not present

## 2012-03-27 DIAGNOSIS — H6120 Impacted cerumen, unspecified ear: Secondary | ICD-10-CM | POA: Diagnosis not present

## 2012-03-27 DIAGNOSIS — IMO0002 Reserved for concepts with insufficient information to code with codable children: Secondary | ICD-10-CM | POA: Diagnosis not present

## 2012-03-27 DIAGNOSIS — I10 Essential (primary) hypertension: Secondary | ICD-10-CM | POA: Diagnosis not present

## 2012-03-27 DIAGNOSIS — E138 Other specified diabetes mellitus with unspecified complications: Secondary | ICD-10-CM | POA: Diagnosis not present

## 2012-03-27 DIAGNOSIS — N879 Dysplasia of cervix uteri, unspecified: Secondary | ICD-10-CM | POA: Diagnosis not present

## 2012-03-28 ENCOUNTER — Telehealth: Payer: Self-pay

## 2012-03-28 DIAGNOSIS — R1011 Right upper quadrant pain: Secondary | ICD-10-CM

## 2012-03-28 NOTE — Telephone Encounter (Signed)
The patient is advised that She is scheduled for IR IV access then HIDA on 04/03/12 0745 at [facility].  She is aware to be NPO after midnight.

## 2012-03-28 NOTE — Telephone Encounter (Signed)
Message copied by Annett Fabian on Fri Mar 28, 2012  9:25 AM ------      Message from: Iva Boop      Created: Tue Mar 25, 2012  2:57 PM       See if we can arrange for IR to place an IV using [place] and then she can get the HIDA w/ EF (? At [facility] or [facility])                  ----- Message -----         From: Rossie Muskrat, RN,CGRN         Sent: 03/20/2012  11:13 AM           To: Iva Boop, MD            See below, what is the next step?      ----- Message -----         From: Patti E [place], CMA         Sent: 03/20/2012  10:43 AM           To: Rossie Muskrat, RN,CGRN            Alinda Money called from Radiology, they were unable to do HIDA scan because unable to get IV access.

## 2012-03-31 ENCOUNTER — Ambulatory Visit: Payer: Medicare Other | Admitting: Pulmonary Disease

## 2012-04-01 ENCOUNTER — Encounter: Payer: Self-pay | Admitting: *Deleted

## 2012-04-02 DIAGNOSIS — C50919 Malignant neoplasm of unspecified site of unspecified female breast: Secondary | ICD-10-CM | POA: Diagnosis not present

## 2012-04-03 ENCOUNTER — Ambulatory Visit (HOSPITAL_COMMUNITY)
Admission: RE | Admit: 2012-04-03 | Discharge: 2012-04-03 | Disposition: A | Discharge status: Home / Self Care | Payer: Medicare Other | Source: Ambulatory Visit | Attending: Internal Medicine | Admitting: Internal Medicine

## 2012-04-03 ENCOUNTER — Encounter (HOSPITAL_COMMUNITY)
Admission: RE | Admit: 2012-04-03 | Discharge: 2012-04-03 | Disposition: A | Discharge status: Home / Self Care | Payer: Medicare Other | Source: Ambulatory Visit | Attending: Internal Medicine | Admitting: Internal Medicine

## 2012-04-03 ENCOUNTER — Other Ambulatory Visit: Payer: Self-pay

## 2012-04-03 DIAGNOSIS — H6120 Impacted cerumen, unspecified ear: Secondary | ICD-10-CM | POA: Diagnosis not present

## 2012-04-03 DIAGNOSIS — R1011 Right upper quadrant pain: Secondary | ICD-10-CM | POA: Insufficient documentation

## 2012-04-03 DIAGNOSIS — R932 Abnormal findings on diagnostic imaging of liver and biliary tract: Secondary | ICD-10-CM | POA: Insufficient documentation

## 2012-04-03 DIAGNOSIS — Z0000 Encounter for general adult medical examination without abnormal findings: Secondary | ICD-10-CM | POA: Diagnosis not present

## 2012-04-03 MED ORDER — SINCALIDE 5 MCG IJ SOLR
0.0200 ug/kg | Freq: Once | INTRAMUSCULAR | Status: AC
  Administered 2012-04-03: 2.43 ug via INTRAVENOUS

## 2012-04-03 MED ORDER — SINCALIDE 5 MCG IJ SOLR
INTRAMUSCULAR | Status: AC
  Administered 2012-04-03: 2.43 ug via INTRAVENOUS
  Filled 2012-04-03: qty 10

## 2012-04-03 MED ORDER — TECHNETIUM TC 99M MEBROFENIN IV KIT
5.0000 | PACK | Freq: Once | INTRAVENOUS | Status: AC | PRN
  Administered 2012-04-03: 5 via INTRAVENOUS

## 2012-04-03 NOTE — Progress Notes (Signed)
Quick Note:  Gallbladder does not empty well at all She needs to see a surgeon about this "evaluate patient with low gallbladder ejection fraction and RUQ pain"   ______

## 2012-04-09 ENCOUNTER — Encounter (HOSPITAL_COMMUNITY): Payer: Self-pay | Admitting: Pharmacy Technician

## 2012-04-09 ENCOUNTER — Ambulatory Visit (INDEPENDENT_AMBULATORY_CARE_PROVIDER_SITE_OTHER): Payer: Medicare Other | Admitting: General Surgery

## 2012-04-09 ENCOUNTER — Encounter (INDEPENDENT_AMBULATORY_CARE_PROVIDER_SITE_OTHER): Payer: Self-pay | Admitting: General Surgery

## 2012-04-09 VITALS — BP 130/64 | HR 68 | Temp 97.5°F | Ht 65.0 in | Wt 260.6 lb

## 2012-04-09 DIAGNOSIS — K828 Other specified diseases of gallbladder: Secondary | ICD-10-CM | POA: Diagnosis not present

## 2012-04-09 NOTE — Progress Notes (Signed)
Patient ID: Michaela Little, female   DOB: December 02, 1960, 52 y.o.   MRN: [id number]  No chief complaint on file.   HPI Michaela Little is a 52 y.o. female.  This is a 52 year old female who is referred by Dr. Leone Payor for biliary dyskinesia.patient states she's had right upper quadrant pain nausea with some emesis at times. She states she does have some nausea that is related to her eating.  Marland KitchenHPI  Past Medical History  Diagnosis Date  . Uterine fibroid   . Hyperlipidemia   . Hypertension   . Asthma   . OSA (obstructive sleep apnea)   . SVD (spontaneous vaginal delivery)     x 5  . Breast cancer 11/2008    left  . Seasonal allergies   . Anxiety   . Shortness of breath     occasional  . Headache     otc med prn  . Arthritis     hands, knees  . History of pneumonia     Past Surgical History  Procedure Date  . Eye surgery 1961    right  . Mastectomy 11/2008    done in [place], left  . Breast surgery 11/2008     done in [place] - breast cancer  . Cervical conization w/bx     done in [place]  . Colonoscopy 2011    normal per patient - [place]  . Dilation and curettage of uterus 10/15/2011    Procedure: DILATATION AND CURETTAGE;  Surgeon: Bing Plume, MD;  Location: [facility];  Service: Gynecology;  Laterality: N/A;  Conization    Family History  Problem Relation Age of Onset  . Breast cancer Mother   . Colon cancer Mother   . Hypotension Mother   . Asthma Mother   . Diabetes type II Mother   . Arthritis Mother   . Clotting disorder Mother   . Mental illness Brother   . Heart disease Brother   . Cerebral palsy Daughter   . Emphysema Brother     never smoker  . Colon cancer Maternal Aunt   . Colon cancer Maternal Uncle     Social History History  Substance Use Topics  . Smoking status: Former Smoker -- 0.3 packs/day for 10 years    Types: Cigarettes    Quit date: 03/26/1988  . Smokeless tobacco: Never Used  . Alcohol Use: No    Allergies  Allergen Reactions  . Calcium  Carbonate Antacid Hives    Fruit flavored Tums     Current Outpatient Prescriptions  Medication Sig Dispense Refill  . albuterol (PROVENTIL HFA;VENTOLIN HFA) 108 (90 BASE) MCG/ACT inhaler Inhale 2 puffs into the lungs every 6 (six) hours as needed. For wheezing      . amLODipine (NORVASC) 10 MG tablet Take 10 mg by mouth daily.      . Calcium 500-125 MG-UNIT TABS Take 1 tablet by mouth 2 (two) times daily.       . fluticasone (FLOVENT HFA) 44 MCG/ACT inhaler Inhale 2 puffs into the lungs 2 (two) times daily as needed. For asthma      . furosemide (LASIX) 40 MG tablet Take 40 mg by mouth daily as needed. For leg swelling      . loratadine (CLARITIN) 10 MG tablet Take 10 mg by mouth daily.       . meloxicam (MOBIC) 15 MG tablet Take 15 mg by mouth at bedtime as needed. For pain      . metoCLOPramide (REGLAN)  10 MG tablet Take 10 mg by mouth every 4 (four) hours.      . montelukast (SINGULAIR) 10 MG tablet Take 10 mg by mouth at bedtime.      Marland Kitchen omeprazole (PRILOSEC) 20 MG capsule Take 20 mg by mouth at bedtime.      . polyethylene glycol (MIRALAX / GLYCOLAX) packet Take 17 g by mouth daily as needed. For constipation        Review of Systems Review of Systems  Last menstrual period 02/25/2009.  Physical Exam Physical Exam  Data Reviewed HIDA scan with ejection fraction of 6.5%  Assessment    52 year old female with biliary dyskinesia    Plan    34. 52 year old female with biliary dyskinesia. We will proceed to the operating room for a laparoscopic cholecystectomy without cholangiogram. 2. All risks and benefits were discussed with the patient, to generally include infection, bleeding, damage to surrounding structures, and recurrence. Alternatives were offered and described.  All questions were answered and the patient voiced understanding of the procedure and wishes to proceed at this point.        Marigene Ehlers., Millissa Deese 04/09/2012, 1:29 PM

## 2012-04-11 DIAGNOSIS — H2510 Age-related nuclear cataract, unspecified eye: Secondary | ICD-10-CM | POA: Diagnosis not present

## 2012-04-11 DIAGNOSIS — H53009 Unspecified amblyopia, unspecified eye: Secondary | ICD-10-CM | POA: Diagnosis not present

## 2012-04-11 DIAGNOSIS — H04129 Dry eye syndrome of unspecified lacrimal gland: Secondary | ICD-10-CM | POA: Diagnosis not present

## 2012-04-16 ENCOUNTER — Encounter (HOSPITAL_COMMUNITY): Payer: Self-pay

## 2012-04-16 ENCOUNTER — Encounter (HOSPITAL_COMMUNITY): Payer: Self-pay | Admitting: Vascular Surgery

## 2012-04-16 ENCOUNTER — Other Ambulatory Visit (INDEPENDENT_AMBULATORY_CARE_PROVIDER_SITE_OTHER): Payer: Self-pay | Admitting: General Surgery

## 2012-04-16 ENCOUNTER — Encounter (HOSPITAL_COMMUNITY)
Admission: RE | Admit: 2012-04-16 | Discharge: 2012-04-16 | Disposition: A | Discharge status: Home / Self Care | Payer: Medicare Other | Source: Ambulatory Visit | Attending: General Surgery | Admitting: General Surgery

## 2012-04-16 DIAGNOSIS — Z01818 Encounter for other preprocedural examination: Secondary | ICD-10-CM | POA: Diagnosis not present

## 2012-04-16 DIAGNOSIS — Z01810 Encounter for preprocedural cardiovascular examination: Secondary | ICD-10-CM | POA: Insufficient documentation

## 2012-04-16 DIAGNOSIS — Z01812 Encounter for preprocedural laboratory examination: Secondary | ICD-10-CM | POA: Diagnosis not present

## 2012-04-16 DIAGNOSIS — Z538 Procedure and treatment not carried out for other reasons: Secondary | ICD-10-CM | POA: Diagnosis not present

## 2012-04-16 LAB — COMPREHENSIVE METABOLIC PANEL
ALT: 8 U/L (ref 0–35)
AST: 19 U/L (ref 0–37)
Calcium: 9.7 mg/dL (ref 8.4–10.5)
Creatinine, Ser: 0.73 mg/dL (ref 0.50–1.10)
GFR calc Af Amer: >90 mL/min (ref >90)
GFR calc non Af Amer: >90 mL/min (ref >90)
Sodium: 141 mEq/L (ref 135–145)
Total Protein: 7.7 g/dL (ref 6.0–8.3)

## 2012-04-16 LAB — CBC
MCHC: 34.4 g/dL (ref 30.0–36.0)
Platelets: 236 10*3/uL (ref 150–400)
RDW: 13.7 % (ref 11.5–15.5)
WBC: 5.3 10*3/uL (ref 4.0–10.5)

## 2012-04-16 LAB — NO BLOOD PRODUCTS

## 2012-04-16 LAB — SURGICAL PCR SCREEN: Staphylococcus aureus: NEGATIVE

## 2012-04-16 NOTE — Progress Notes (Signed)
REQUESTED STRESS TEST, ECHO, EKG, ANY CARDIAC TEST, OV, SLEEP STUDY FROM [facility] IN [place] [phone].   [facility] TO CONSULT WITH PATIENT DUE TO C/O CHEST PAIN.

## 2012-04-16 NOTE — Progress Notes (Signed)
NOTIFIED OFFICE OF NEEDING ORDERS.

## 2012-04-16 NOTE — Pre-Procedure Instructions (Signed)
Michaela Little  04/16/2012   Your procedure is scheduled on: Monday  04/21/12   Report to [facility] at 530 AM.  Call this number if you have problems the morning of surgery: [phone]   Remember:   Do not eat food or drink liquids after midnight.   Take these medicines the morning of surgery with A SIP OF WATER: ALBUTEROL INHALER, AMLODIPINE(NORVASC), ARIMIDEX, FLOVENT INHALER   Do not wear jewelry, make-up or nail polish.  Do not wear lotions, powders, or perfumes. You may wear deodorant.  Do not shave 48 hours prior to surgery. Men may shave face and neck.  Do not bring valuables to the hospital.  Contacts, dentures or bridgework may not be worn into surgery.  Leave suitcase in the car. After surgery it may be brought to your room.  For patients admitted to the hospital, checkout time is 11:00 AM the day of  discharge.   Patients discharged the day of surgery will not be allowed to drive  home.  Name and phone number of your driver:   Special Instructions: Shower using CHG 2 nights before surgery and the night before surgery.  If you shower the day of surgery use CHG.  Use special wash - you have one bottle of CHG for all showers.  You should use approximately 1/3 of the bottle for each shower.   Please read over the following fact sheets that you were given: Pain Booklet, Coughing and Deep Breathing, MRSA Information and Surgical Site Infection Prevention

## 2012-04-16 NOTE — Consult Note (Addendum)
Anesthesia Consult:  Patient is a 52 year old female scheduled for laparoscopic cholecystectomy on 04/21/12 by Dr. Derrell Lolling.  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  PCP is listed as Dr. Oneal Grout at [facility].    She saw Cardiologist Dr. Peter Swaziland on 11/13/11 for evaluation of diastolic dysfunction and chest pain, which he thought was atypical.  She reported a normal nuclear stress test within the past two years, so he was awaiting those results before recommending any additional testing.  She was started on PPI therapy.  He wrote, "If she has refractory chest pain we could repeat a nuclear stress test but I think this is low yield."  Today I was asked to speak with her because she continues to experience intermittent chest tightness.  Her responses are somewhat vague, but she describes several different scenarios when her chest will feel tight.  First when she feels she is getting an asthma with improvement of symptoms with albuterol use.  Secondly, for the past several months her abdomen will feel quite distended at times.  When severe, her entire chest (left and right) will feel tight with some improvement with flatus.  Thirdly, she will occasionally get chest tightness with exertion (ie walking 1 - 1 1/2 blocks when she become short of breath.  She has chronic DOE anyway .  She is able to start walking after about two minutes of rest.  It doesn't occur all the time.  She may or may not have left or right arm pain.  She gets occasional diaphoresis regardless of if she has chest pain or not.  She has had a degree of chest pain for at least a couple of years, but feels the characteristics of her symptoms have changed somewhat over time.  She feels her asthma is at baseline.  She uses  her MDI maybe a few times a week.  She denies any recent URI.  She gets mild LE edema which she feels is about at her baseline.  Exam shows a pleasant female, but with flat affect.  Heart RRR, no murmurs noted.  Lungs clear, no wheezing.  No carotid bruits noted.  Trace LE edema.  Mallampati seems to be at least a III.  She denies history of difficult intubation.  EKG on 04/16/12 showed SB @ 55 bpm.  Echo on 10/30/11 showed: - Left ventricle: The cavity size was normal. Wall thickness was increased in a pattern of mild LVH. Systolic function was normal. The estimated ejection fraction was in the range of 60% to 65%. There was dynamic obstruction. Wall motion was normal; there were no regional wall motion abnormalities. Features are consistent with a pseudonormal left ventricular filling pattern, with concomitant abnormal relaxation and increased filling pressure (grade 2 diastolic dysfunction). - Atrial septum: No defect or patent foramen ovale was identified.  [facility] medical records did not have records of a stress test within the past 3 years.  (Patient is going to call me back if she has additional details on where to find her previous stress test.)   CXR on 01/10/12 showed no acute cardiopulmonary abnormalities.  Preoperative labs noted. CBC and CMET are WNL.  She has refused all blood products.  Her chest pain symptoms have some typical and atypical features--but primarily seems secondary to her bloating  or asthma.  As above, she has had some degree of intermittent chest pain for the last few years with reported negative work-up.  Her EKG shows SR.  Unfortunately, I've not been able to locate her stress test that was done within the last few years which would hopefully support a non-cardiac etiology. Subsequently, I will ask Dr. Derrell Lolling to contact Dr. Swaziland for pre-operative recommendations.  If I am able to obtain a copy of her stress test, I will let Dr. Swaziland  know.  Triage nurse Britta Mccreedy at [facility] notified of request for preoperative cardiology input.  Shonna Chock, PA-C 04/16/12 1650  Addendum: 04/17/12 1220 I was able to find a copy of patient's stress echo from 09/13/09 at [facility].  Dobutamine stress echocardiogram was negative for ischemia induced wall motion abnormality and showed minimal MR, EF 65%.  Results sent to [facility] (Dr. Swaziland).

## 2012-04-17 ENCOUNTER — Other Ambulatory Visit: Payer: Self-pay | Admitting: Internal Medicine

## 2012-04-17 NOTE — Telephone Encounter (Signed)
Would you like for me to refill, patient is set up for cholecystectomy 04/21/12. Please advise, thank you.

## 2012-04-17 NOTE — Telephone Encounter (Signed)
Ok to refill x 2 total She may not need to stay on it once GB out

## 2012-04-18 ENCOUNTER — Encounter (INDEPENDENT_AMBULATORY_CARE_PROVIDER_SITE_OTHER): Payer: Self-pay | Admitting: General Surgery

## 2012-04-18 ENCOUNTER — Telehealth (INDEPENDENT_AMBULATORY_CARE_PROVIDER_SITE_OTHER): Payer: Self-pay | Admitting: General Surgery

## 2012-04-18 NOTE — Telephone Encounter (Signed)
Michaela Little from short stay called and stated that she received copy of stress test from 2011 after obtaining input from Dr. Swaziland and it was sent to [facility]. Wanted to know if a request for cardiac clearance for this patient has been done. This patient complains of chest pain that occurs when walking. Elease Hashimoto concerned patient may have other issues that were not disclosed. She requested call back on [phone]. I advised her that I would send the message to Dr. Derrell Lolling and his assistant.

## 2012-04-21 ENCOUNTER — Encounter (HOSPITAL_COMMUNITY): Admission: RE | Payer: Self-pay | Source: Ambulatory Visit

## 2012-04-21 ENCOUNTER — Telehealth (INDEPENDENT_AMBULATORY_CARE_PROVIDER_SITE_OTHER): Payer: Self-pay | Admitting: General Surgery

## 2012-04-21 ENCOUNTER — Ambulatory Visit (HOSPITAL_COMMUNITY): Admission: RE | Admit: 2012-04-21 | Payer: Medicare Other | Source: Ambulatory Visit | Admitting: General Surgery

## 2012-04-21 SURGERY — LAPAROSCOPIC CHOLECYSTECTOMY
Anesthesia: General

## 2012-04-21 NOTE — Telephone Encounter (Signed)
LMOM at 9:51 for patient to call me back...LT1/27/14

## 2012-04-21 NOTE — Telephone Encounter (Signed)
Spoke to [place] at Dr.Jordan's office to see if they had received req for Cardiac Clearance and if the patient would need an appt before CC could be given in which she stated that it had been less than 9mo since they had last seen her do that would be up to Dr.Jordan who would be back in the office on 04/22/12

## 2012-04-22 ENCOUNTER — Telehealth: Payer: Self-pay

## 2012-04-22 ENCOUNTER — Encounter (INDEPENDENT_AMBULATORY_CARE_PROVIDER_SITE_OTHER): Payer: Self-pay

## 2012-04-22 NOTE — Telephone Encounter (Signed)
[facility] called was told Dr.Jordan cleared patient for surgery from a cardiac standpoint.

## 2012-04-29 DIAGNOSIS — I10 Essential (primary) hypertension: Secondary | ICD-10-CM | POA: Diagnosis not present

## 2012-04-29 DIAGNOSIS — R300 Dysuria: Secondary | ICD-10-CM | POA: Diagnosis not present

## 2012-04-29 DIAGNOSIS — N390 Urinary tract infection, site not specified: Secondary | ICD-10-CM | POA: Diagnosis not present

## 2012-05-01 ENCOUNTER — Other Ambulatory Visit: Payer: Self-pay | Admitting: Internal Medicine

## 2012-05-01 DIAGNOSIS — N390 Urinary tract infection, site not specified: Secondary | ICD-10-CM

## 2012-05-02 ENCOUNTER — Ambulatory Visit
Admission: RE | Admit: 2012-05-02 | Discharge: 2012-05-02 | Disposition: A | Discharge status: Home / Self Care | Payer: Medicare Other | Source: Ambulatory Visit | Attending: Internal Medicine | Admitting: Internal Medicine

## 2012-05-02 DIAGNOSIS — N390 Urinary tract infection, site not specified: Secondary | ICD-10-CM

## 2012-05-05 ENCOUNTER — Encounter (HOSPITAL_COMMUNITY): Payer: Self-pay | Admitting: Pharmacy Technician

## 2012-05-05 MED ORDER — CEFAZOLIN SODIUM-DEXTROSE 2-3 GM-% IV SOLR
INTRAVENOUS | Status: AC
  Filled 2012-05-05: qty 50

## 2012-05-07 ENCOUNTER — Encounter (HOSPITAL_COMMUNITY)
Admission: RE | Admit: 2012-05-07 | Discharge: 2012-05-07 | Disposition: A | Discharge status: Home / Self Care | Payer: Medicare Other | Source: Ambulatory Visit | Attending: General Surgery | Admitting: General Surgery

## 2012-05-07 ENCOUNTER — Encounter (HOSPITAL_COMMUNITY): Payer: Self-pay

## 2012-05-07 DIAGNOSIS — F411 Generalized anxiety disorder: Secondary | ICD-10-CM | POA: Diagnosis not present

## 2012-05-07 DIAGNOSIS — J45909 Unspecified asthma, uncomplicated: Secondary | ICD-10-CM | POA: Diagnosis not present

## 2012-05-07 DIAGNOSIS — M129 Arthropathy, unspecified: Secondary | ICD-10-CM | POA: Diagnosis not present

## 2012-05-07 DIAGNOSIS — Z87891 Personal history of nicotine dependence: Secondary | ICD-10-CM | POA: Diagnosis not present

## 2012-05-07 DIAGNOSIS — G4733 Obstructive sleep apnea (adult) (pediatric): Secondary | ICD-10-CM | POA: Diagnosis not present

## 2012-05-07 DIAGNOSIS — I10 Essential (primary) hypertension: Secondary | ICD-10-CM | POA: Diagnosis not present

## 2012-05-07 DIAGNOSIS — E785 Hyperlipidemia, unspecified: Secondary | ICD-10-CM | POA: Diagnosis not present

## 2012-05-07 DIAGNOSIS — K811 Chronic cholecystitis: Secondary | ICD-10-CM | POA: Diagnosis not present

## 2012-05-07 LAB — BASIC METABOLIC PANEL
CO2: 27 mEq/L (ref 19–32)
Calcium: 9.6 mg/dL (ref 8.4–10.5)
GFR calc Af Amer: >90 mL/min (ref >90)
Sodium: 141 mEq/L (ref 135–145)

## 2012-05-07 LAB — CBC
MCV: 81 fL (ref 78.0–100.0)
Platelets: 230 10*3/uL (ref 150–400)
RBC: 5.22 MIL/uL — ABNORMAL HIGH (ref 3.87–5.11)
WBC: 5.2 10*3/uL (ref 4.0–10.5)

## 2012-05-07 NOTE — Pre-Procedure Instructions (Addendum)
Michaela Little  05/07/2012   Your procedure is scheduled on: wed 2.19.14  Report to [facility] at 900AM.  Call this number if you have problems the morning of surgery: [phone]   Remember:   Do not eat food or drink liquids after midnight.   Take these medicines the morning of surgery with A SIP OF WATER: ALBUTEROL INHALER, AMLODIPINE(NORVASC), ARIMIDEX, FLOVENT INHALER, reglan, prilosec   Do not wear jewelry, make-up or nail polish.  Do not wear lotions, powders, or perfumes. You may wear deodorant.  Do not shave 48 hours prior to surgery. Men may shave face and neck.  Do not bring valuables to the hospital.  Contacts, dentures or bridgework may not be worn into surgery.  Leave suitcase in the car. After surgery it may be brought to your room.  For patients admitted to the hospital, checkout time is 11:00 AM the day of  discharge.   Patients discharged the day of surgery will not be allowed to drive  home.  Name and phone number of your driver:   Special Instructions: Shower using CHG 2 nights before surgery and the night before surgery.  If you shower the day of surgery use CHG.  Use special wash - you have one bottle of CHG for all showers.  You should use approximately 1/3 of the bottle for each shower.   Please read over the following fact sheets that you were given: Pain Booklet, Coughing and Deep Breathing, MRSA Information and Surgical Site Infection Prevention

## 2012-05-08 ENCOUNTER — Encounter (INDEPENDENT_AMBULATORY_CARE_PROVIDER_SITE_OTHER): Payer: Medicare Other | Admitting: General Surgery

## 2012-05-13 MED ORDER — CEFAZOLIN SODIUM-DEXTROSE 2-3 GM-% IV SOLR
2.0000 g | INTRAVENOUS | Status: AC
  Administered 2012-05-14: 2 g via INTRAVENOUS
  Filled 2012-05-13: qty 50

## 2012-05-13 MED ORDER — CHLORHEXIDINE GLUCONATE 4 % EX LIQD: 1.0000 "application " | Freq: Once | CUTANEOUS | Status: DC

## 2012-05-13 NOTE — Progress Notes (Signed)
PATIENT INSTRUCTED TO ARRIVE AT 830 AM ON 05/14/12.

## 2012-05-14 ENCOUNTER — Ambulatory Visit (HOSPITAL_COMMUNITY)
Admission: RE | Admit: 2012-05-14 | Discharge: 2012-05-14 | Disposition: A | Discharge status: Home / Self Care | Payer: Medicare Other | Source: Ambulatory Visit | Attending: General Surgery | Admitting: General Surgery

## 2012-05-14 ENCOUNTER — Encounter (HOSPITAL_COMMUNITY)
Admission: RE | Disposition: A | Discharge status: Home / Self Care | Payer: Self-pay | Source: Ambulatory Visit | Attending: General Surgery

## 2012-05-14 ENCOUNTER — Encounter (HOSPITAL_COMMUNITY): Payer: Self-pay

## 2012-05-14 ENCOUNTER — Ambulatory Visit (HOSPITAL_COMMUNITY): Payer: Medicare Other | Admitting: Anesthesiology

## 2012-05-14 ENCOUNTER — Encounter (HOSPITAL_COMMUNITY): Payer: Self-pay | Admitting: Vascular Surgery

## 2012-05-14 DIAGNOSIS — K811 Chronic cholecystitis: Secondary | ICD-10-CM | POA: Insufficient documentation

## 2012-05-14 DIAGNOSIS — J45909 Unspecified asthma, uncomplicated: Secondary | ICD-10-CM | POA: Insufficient documentation

## 2012-05-14 DIAGNOSIS — I10 Essential (primary) hypertension: Secondary | ICD-10-CM | POA: Insufficient documentation

## 2012-05-14 DIAGNOSIS — G4733 Obstructive sleep apnea (adult) (pediatric): Secondary | ICD-10-CM | POA: Insufficient documentation

## 2012-05-14 DIAGNOSIS — M129 Arthropathy, unspecified: Secondary | ICD-10-CM | POA: Insufficient documentation

## 2012-05-14 DIAGNOSIS — K828 Other specified diseases of gallbladder: Secondary | ICD-10-CM | POA: Diagnosis not present

## 2012-05-14 DIAGNOSIS — D259 Leiomyoma of uterus, unspecified: Secondary | ICD-10-CM | POA: Diagnosis not present

## 2012-05-14 DIAGNOSIS — E785 Hyperlipidemia, unspecified: Secondary | ICD-10-CM | POA: Insufficient documentation

## 2012-05-14 DIAGNOSIS — Z87891 Personal history of nicotine dependence: Secondary | ICD-10-CM | POA: Insufficient documentation

## 2012-05-14 DIAGNOSIS — K824 Cholesterolosis of gallbladder: Secondary | ICD-10-CM

## 2012-05-14 DIAGNOSIS — F411 Generalized anxiety disorder: Secondary | ICD-10-CM | POA: Diagnosis not present

## 2012-05-14 HISTORY — PX: CHOLECYSTECTOMY: SHX55

## 2012-05-14 SURGERY — LAPAROSCOPIC CHOLECYSTECTOMY
Anesthesia: General | Site: Abdomen | Wound class: Clean Contaminated

## 2012-05-14 MED ORDER — DEXAMETHASONE SODIUM PHOSPHATE 10 MG/ML IJ SOLN
INTRAMUSCULAR | Status: DC | PRN
  Administered 2012-05-14: 10 mg via INTRAVENOUS

## 2012-05-14 MED ORDER — SODIUM CHLORIDE 0.9 % IV SOLN: 250.0000 mL | INTRAVENOUS | Status: DC | PRN

## 2012-05-14 MED ORDER — FENTANYL CITRATE 0.05 MG/ML IJ SOLN
INTRAMUSCULAR | Status: DC | PRN
  Administered 2012-05-14: 100 ug via INTRAVENOUS
  Administered 2012-05-14: 50 ug via INTRAVENOUS

## 2012-05-14 MED ORDER — EPHEDRINE SULFATE 50 MG/ML IJ SOLN
INTRAMUSCULAR | Status: DC | PRN
  Administered 2012-05-14: 10 mg via INTRAVENOUS

## 2012-05-14 MED ORDER — SODIUM CHLORIDE 0.9 % IJ SOLN: 3.0000 mL | Freq: Two times a day (BID) | INTRAMUSCULAR | Status: DC

## 2012-05-14 MED ORDER — 0.9 % SODIUM CHLORIDE (POUR BTL) OPTIME
TOPICAL | Status: DC | PRN
  Administered 2012-05-14: 1000 mL

## 2012-05-14 MED ORDER — BUPIVACAINE HCL 0.25 % IJ SOLN
INTRAMUSCULAR | Status: DC | PRN
  Administered 2012-05-14: 8 mL

## 2012-05-14 MED ORDER — PROPOFOL 10 MG/ML IV BOLUS
INTRAVENOUS | Status: DC | PRN
  Administered 2012-05-14: 200 mg via INTRAVENOUS

## 2012-05-14 MED ORDER — ACETAMINOPHEN 325 MG PO TABS
650.0000 mg | ORAL_TABLET | ORAL | Status: DC | PRN
  Filled 2012-05-14: qty 2

## 2012-05-14 MED ORDER — GLYCOPYRROLATE 0.2 MG/ML IJ SOLN
INTRAMUSCULAR | Status: DC | PRN
  Administered 2012-05-14: .8 mg via INTRAVENOUS
  Administered 2012-05-14: 0.2 mg via INTRAVENOUS

## 2012-05-14 MED ORDER — LIDOCAINE HCL (CARDIAC) 20 MG/ML IV SOLN
INTRAVENOUS | Status: DC | PRN
  Administered 2012-05-14: 100 mg via INTRAVENOUS

## 2012-05-14 MED ORDER — ROCURONIUM BROMIDE 100 MG/10ML IV SOLN
INTRAVENOUS | Status: DC | PRN
  Administered 2012-05-14: 40 mg via INTRAVENOUS

## 2012-05-14 MED ORDER — SODIUM CHLORIDE 0.9 % IR SOLN
Status: DC | PRN
  Administered 2012-05-14: 1000 mL

## 2012-05-14 MED ORDER — SUCCINYLCHOLINE CHLORIDE 20 MG/ML IJ SOLN
INTRAMUSCULAR | Status: DC | PRN
  Administered 2012-05-14: 140 mg via INTRAVENOUS

## 2012-05-14 MED ORDER — OXYCODONE HCL 5 MG PO TABS: 5.0000 mg | ORAL_TABLET | Freq: Once | ORAL | Status: DC | PRN

## 2012-05-14 MED ORDER — LACTATED RINGERS IV SOLN
INTRAVENOUS | Status: DC | PRN
  Administered 2012-05-14 (×2): via INTRAVENOUS

## 2012-05-14 MED ORDER — SODIUM CHLORIDE 0.9 % IJ SOLN: 3.0000 mL | INTRAMUSCULAR | Status: DC | PRN

## 2012-05-14 MED ORDER — NEOSTIGMINE METHYLSULFATE 1 MG/ML IJ SOLN
INTRAMUSCULAR | Status: DC | PRN
  Administered 2012-05-14: 4 mg via INTRAVENOUS

## 2012-05-14 MED ORDER — METOCLOPRAMIDE HCL 5 MG/ML IJ SOLN
INTRAMUSCULAR | Status: AC
  Administered 2012-05-14: 10 mg via INTRAVENOUS
  Filled 2012-05-14: qty 2

## 2012-05-14 MED ORDER — OXYCODONE HCL 5 MG/5ML PO SOLN: 5.0000 mg | Freq: Once | ORAL | Status: DC | PRN

## 2012-05-14 MED ORDER — LACTATED RINGERS IV SOLN
INTRAVENOUS | Status: DC
  Administered 2012-05-14: 10:00:00 via INTRAVENOUS

## 2012-05-14 MED ORDER — OXYCODONE HCL 5 MG PO TABS: 5.0000 mg | ORAL_TABLET | ORAL | Status: DC | PRN

## 2012-05-14 MED ORDER — ACETAMINOPHEN 650 MG RE SUPP
650.0000 mg | RECTAL | Status: DC | PRN
  Filled 2012-05-14: qty 1

## 2012-05-14 MED ORDER — ONDANSETRON HCL 4 MG/2ML IJ SOLN: 4.0000 mg | Freq: Four times a day (QID) | INTRAMUSCULAR | Status: DC | PRN

## 2012-05-14 MED ORDER — ONDANSETRON HCL 4 MG/2ML IJ SOLN
INTRAMUSCULAR | Status: DC | PRN
  Administered 2012-05-14: 4 mg via INTRAVENOUS

## 2012-05-14 MED ORDER — OXYCODONE-ACETAMINOPHEN 10-325 MG PO TABS: 1.0000 | ORAL_TABLET | Freq: Four times a day (QID) | ORAL | Status: DC | PRN

## 2012-05-14 MED ORDER — HYDROMORPHONE HCL PF 1 MG/ML IJ SOLN
0.2500 mg | INTRAMUSCULAR | Status: DC | PRN
  Administered 2012-05-14 (×2): 0.5 mg via INTRAVENOUS

## 2012-05-14 MED ORDER — HYDROMORPHONE HCL PF 1 MG/ML IJ SOLN
INTRAMUSCULAR | Status: AC
  Administered 2012-05-14: 0.5 mg via INTRAVENOUS
  Filled 2012-05-14: qty 1

## 2012-05-14 MED ORDER — METOCLOPRAMIDE HCL 5 MG/ML IJ SOLN
10.0000 mg | Freq: Once | INTRAMUSCULAR | Status: AC | PRN
  Administered 2012-05-14: 10 mg via INTRAVENOUS

## 2012-05-14 MED ORDER — BUPIVACAINE HCL (PF) 0.25 % IJ SOLN
INTRAMUSCULAR | Status: AC
  Filled 2012-05-14: qty 30

## 2012-05-14 MED ORDER — MIDAZOLAM HCL 5 MG/5ML IJ SOLN
INTRAMUSCULAR | Status: DC | PRN
  Administered 2012-05-14: 2 mg via INTRAVENOUS

## 2012-05-14 SURGICAL SUPPLY — 39 items
APPLIER CLIP 5 13 M/L LIGAMAX5 (MISCELLANEOUS) ×2
BENZOIN TINCTURE PRP APPL 2/3 (GAUZE/BANDAGES/DRESSINGS) ×2 IMPLANT
CANISTER SUCTION 2500CC (MISCELLANEOUS) ×2 IMPLANT
CHLORAPREP W/TINT 26ML (MISCELLANEOUS) ×2 IMPLANT
CLIP APPLIE 5 13 M/L LIGAMAX5 (MISCELLANEOUS) ×1 IMPLANT
CLOTH BEACON ORANGE TIMEOUT ST (SAFETY) ×2 IMPLANT
CLSR STERI-STRIP ANTIMIC 1/2X4 (GAUZE/BANDAGES/DRESSINGS) ×2 IMPLANT
COVER MAYO STAND STRL (DRAPES) IMPLANT
COVER SURGICAL LIGHT HANDLE (MISCELLANEOUS) ×2 IMPLANT
COVER TRANSDUCER ULTRASND (DRAPES) ×2 IMPLANT
DEVICE TROCAR PUNCTURE CLOSURE (ENDOMECHANICALS) ×2 IMPLANT
DRAPE C-ARM 42X72 X-RAY (DRAPES) IMPLANT
DRAPE UTILITY 15X26 W/TAPE STR (DRAPE) ×4 IMPLANT
ELECT REM PT RETURN 9FT ADLT (ELECTROSURGICAL) ×2
ELECTRODE REM PT RTRN 9FT ADLT (ELECTROSURGICAL) ×1 IMPLANT
GAUZE SPONGE 2X2 8PLY STRL LF (GAUZE/BANDAGES/DRESSINGS) ×1 IMPLANT
GLOVE BIO SURGEON STRL SZ7.5 (GLOVE) ×2 IMPLANT
GOWN STRL NON-REIN LRG LVL3 (GOWN DISPOSABLE) ×6 IMPLANT
GOWN STRL REIN XL XLG (GOWN DISPOSABLE) ×2 IMPLANT
IV CATH 14GX2 1/4 (CATHETERS) IMPLANT
KIT BASIN OR (CUSTOM PROCEDURE TRAY) ×2 IMPLANT
KIT ROOM TURNOVER OR (KITS) ×2 IMPLANT
NEEDLE INSUFFLATION 14GA 120MM (NEEDLE) ×2 IMPLANT
NS IRRIG 1000ML POUR BTL (IV SOLUTION) ×2 IMPLANT
PAD ARMBOARD 7.5X6 YLW CONV (MISCELLANEOUS) ×4 IMPLANT
POUCH SPECIMEN RETRIEVAL 10MM (ENDOMECHANICALS) IMPLANT
SCISSORS LAP 5X35 DISP (ENDOMECHANICALS) ×2 IMPLANT
SET CHOLANGIOGRAPHY FRANKLIN (SET/KITS/TRAYS/PACK) IMPLANT
SET IRRIG TUBING LAPAROSCOPIC (IRRIGATION / IRRIGATOR) ×2 IMPLANT
SLEEVE ENDOPATH XCEL 5M (ENDOMECHANICALS) ×2 IMPLANT
SPECIMEN JAR SMALL (MISCELLANEOUS) ×2 IMPLANT
SPONGE GAUZE 2X2 STER 10/PKG (GAUZE/BANDAGES/DRESSINGS) ×1
SUT MNCRL AB 3-0 PS2 18 (SUTURE) ×2 IMPLANT
TAPE CLOTH SURG 4X10 WHT LF (GAUZE/BANDAGES/DRESSINGS) ×2 IMPLANT
TOWEL OR 17X24 6PK STRL BLUE (TOWEL DISPOSABLE) ×2 IMPLANT
TOWEL OR 17X26 10 PK STRL BLUE (TOWEL DISPOSABLE) ×2 IMPLANT
TRAY LAPAROSCOPIC (CUSTOM PROCEDURE TRAY) ×2 IMPLANT
TROCAR XCEL NON-BLD 11X100MML (ENDOMECHANICALS) ×2 IMPLANT
TROCAR XCEL NON-BLD 5MMX100MML (ENDOMECHANICALS) ×2 IMPLANT

## 2012-05-14 NOTE — Progress Notes (Signed)
Pt states had renal US due to having sharp pain after urinating lasting about 2 minutes. Primary Dr. Eather Colas and treated her for UTI also.  patient states she did not have UTI after all.

## 2012-05-14 NOTE — Transfer of Care (Signed)
Immediate Anesthesia Transfer of Care Note  Patient: Michaela Little  Procedure(s) Performed: Procedure(s): LAPAROSCOPIC CHOLECYSTECTOMY (N/A)  Patient Location: PACU  Anesthesia Type:General  Level of Consciousness: awake, alert , oriented and patient cooperative  Airway & Oxygen Therapy: Patient Spontanous Breathing and Patient connected to nasal cannula oxygen  Post-op Assessment: Report given to PACU RN, Post -op Vital signs reviewed and stable and Patient moving all extremities X 4  Post vital signs: Reviewed and stable  Complications: No apparent anesthesia complications

## 2012-05-14 NOTE — Progress Notes (Signed)
Pt states left calf pain occurs when walking a lot. No swelling, no tenderness to touch and no redness noted.

## 2012-05-14 NOTE — Op Note (Signed)
Pre Operative Diagnosis: biliary dyskenesia  Post Operative Diagnosis: same  Surgeon: Dr. Axel Filler   Procedure:Lap chole  Assistant: none  Anesthesia: Gen. Endotracheal anesthesia   EBL: <5cc  Complications:  Counts: reported as correct x 2   Findings: The patient had minimally inflammed gallbladder  Indications for procedure: Pt is a 52 y/o F with RUQ pain after meals.  Pt had referred for sx's of biliary dyskenesia.  She decided to have her gallbladder electively removed.  Details of the procedure:  The patient was taken to the operating and placed in the supine position with bilateral SCDs in place. A time out was called and all facts were verified. A pneumoperitoneum was obtained via A Veress needle technique to a pressure of 14mm of mercury. A 5mm trochar was then placed in the right upper quadrant under visualization, and there were no injuries to any abdominal organs. A 11 mm port was then placed in the umbilical region after infiltrating with local anesthesia under direct visualization. A second 5mm epigastric port was placement under direct visualization. The gallbladder was identified and retracted, the peritoneum was then sharply dissected from the gallbladder and this dissection was carried down to Calot's triangle. The gallbladder was identified and stripped away circumferentially and seen going into the gallbladder 360 and the critical view was obtained. 2 clips were placed proximally one distally and the cystic duct transected. The cystic artery was identified and 2 clips placed proximally and one distally and transected.  We then proceeded to remove the gallbladder off the hepatic fossa with Bovie cautery. A latex retreival bag was then placed in the abdomen and gallbladder placed in the bag and the bag was removed from the abdomen. The hepatic fossa was then reexamined and hemostasis was achieved with Bovie cautery and was excellent at the end of the case. The  subhepatic fossa and perihepatic fossa was then irrigated until the effluent was clear. The 11 mm trocar fascia was reapproximated with the Endo Close #1 Vicryl.  The pneumoperitoneum was evacuated and all trochars removed under direct visulalization.  The skin was then closed with 4-0 Monocryl and the skin dressed with Steri-Strips, gauze, and tape.  The patient was awaken from general anesthesia and taken to the recovery room in stable condition.

## 2012-05-14 NOTE — Anesthesia Preprocedure Evaluation (Signed)
Anesthesia Evaluation  Patient identified by MRN, date of birth, ID band Patient awake    Reviewed: Allergy & Precautions, H&P , NPO status , Patient's Chart, lab work & pertinent test results, reviewed documented beta blocker date and time   Airway Mallampati: II TM Distance: >3 FB Neck ROM: full    Dental   Pulmonary shortness of breath, asthma , sleep apnea , pneumonia -, resolved,  breath sounds clear to auscultation        Cardiovascular hypertension, + angina + Valvular Problems/Murmurs Rhythm:regular     Neuro/Psych  Headaches, negative psych ROS   GI/Hepatic negative GI ROS, Neg liver ROS,   Endo/Other  Morbid obesity  Renal/GU negative Renal ROS  negative genitourinary   Musculoskeletal   Abdominal   Peds  Hematology negative hematology ROS (+)   Anesthesia Other Findings See surgeon's H&P   Reproductive/Obstetrics negative OB ROS                           Anesthesia Physical Anesthesia Plan  ASA: III  Anesthesia Plan: General   Post-op Pain Management:    Induction: Intravenous  Airway Management Planned: Oral ETT  Additional Equipment:   Intra-op Plan:   Post-operative Plan: Extubation in OR  Informed Consent: I have reviewed the patients History and Physical, chart, labs and discussed the procedure including the risks, benefits and alternatives for the proposed anesthesia with the patient or authorized representative who has indicated his/her understanding and acceptance.   Dental Advisory Given  Plan Discussed with: CRNA and Surgeon  Anesthesia Plan Comments:         Anesthesia Quick Evaluation

## 2012-05-14 NOTE — H&P (Signed)
HPI  Michaela Little is a 52 y.o. female. This is a 52 year old female who is referred by Dr. Leone Payor for biliary dyskinesia.patient states she's had right upper quadrant pain nausea with some emesis at times. She states she does have some nausea that is related to her eating.  Marland KitchenHPI  Past Medical History   Diagnosis  Date   .  Uterine fibroid    .  Hyperlipidemia    .  Hypertension    .  Asthma    .  OSA (obstructive sleep apnea)    .  SVD (spontaneous vaginal delivery)      x 5   .  Breast cancer  11/2008     left   .  Seasonal allergies    .  Anxiety    .  Shortness of breath      occasional   .  Headache      otc med prn   .  Arthritis      hands, knees   .  History of pneumonia     Past Surgical History   Procedure  Date   .  Eye surgery  1961     right   .  Mastectomy  11/2008     done in [place], left   .  Breast surgery  11/2008     done in [place] - breast cancer   .  Cervical conization w/bx      done in [place]   .  Colonoscopy  2011     normal per patient - [place]   .  Dilation and curettage of uterus  10/15/2011     Procedure: DILATATION AND CURETTAGE; Surgeon: Bing Plume, MD; Location: [facility]; Service: Gynecology; Laterality: N/A; Conization    Family History   Problem  Relation  Age of Onset   .  Breast cancer  Mother    .  Colon cancer  Mother    .  Hypotension  Mother    .  Asthma  Mother    .  Diabetes type II  Mother    .  Arthritis  Mother    .  Clotting disorder  Mother    .  Mental illness  Brother    .  Heart disease  Brother    .  Cerebral palsy  Daughter    .  Emphysema  Brother       never smoker    .  Colon cancer  Maternal Aunt    .  Colon cancer  Maternal Uncle    Social History  History   Substance Use Topics   .  Smoking status:  Former Smoker -- 0.3 packs/day for 10 years     Types:  Cigarettes     Quit date:  03/26/1988   .  Smokeless tobacco:  Never Used   .  Alcohol Use:  No    Allergies   Allergen  Reactions   .  Calcium Carbonate  Antacid  Hives     Fruit flavored Tums    Current Outpatient Prescriptions   Medication  Sig  Dispense  Refill   .  albuterol (PROVENTIL HFA;VENTOLIN HFA) 108 (90 BASE) MCG/ACT inhaler  Inhale 2 puffs into the lungs every 6 (six) hours as needed. For wheezing     .  amLODipine (NORVASC) 10 MG tablet  Take 10 mg by mouth daily.     .  Calcium 500-125 MG-UNIT TABS  Take 1 tablet by  mouth 2 (two) times daily.     .  fluticasone (FLOVENT HFA) 44 MCG/ACT inhaler  Inhale 2 puffs into the lungs 2 (two) times daily as needed. For asthma     .  furosemide (LASIX) 40 MG tablet  Take 40 mg by mouth daily as needed. For leg swelling     .  loratadine (CLARITIN) 10 MG tablet  Take 10 mg by mouth daily.     .  meloxicam (MOBIC) 15 MG tablet  Take 15 mg by mouth at bedtime as needed. For pain     .  metoCLOPramide (REGLAN) 10 MG tablet  Take 10 mg by mouth every 4 (four) hours.     .  montelukast (SINGULAIR) 10 MG tablet  Take 10 mg by mouth at bedtime.     Marland Kitchen  omeprazole (PRILOSEC) 20 MG capsule  Take 20 mg by mouth at bedtime.     .  polyethylene glycol (MIRALAX / GLYCOLAX) packet  Take 17 g by mouth daily as needed. For constipation     Review of Systems  Review of Systems  Last menstrual period 02/25/2009.  Physical Exam  Physical Exam  Data Reviewed  HIDA scan with ejection fraction of 6.5%  Assessment  52 year old female with biliary dyskinesia  Plan  64. 52 year old female with biliary dyskinesia. We will proceed to the operating room for a laparoscopic cholecystectomy without cholangiogram.  2. All risks and benefits were discussed with the patient to generally include: infection, bleeding, possible need for post op ERCP, damage to the bile ducts, and bile leak. Alternatives were offered and described.  All questions were answered and the patient voiced understanding of the procedure and wishes to proceed at this point with a laparoscopic cholecystectomy

## 2012-05-14 NOTE — Anesthesia Procedure Notes (Signed)
Procedure Name: Intubation Date/Time: 05/14/2012 10:51 AM Performed by: Rogelia Boga Pre-anesthesia Checklist: Patient identified, Emergency Drugs available, Suction available, Patient being monitored and Timeout performed Patient Re-evaluated:Patient Re-evaluated prior to inductionOxygen Delivery Method: Circle system utilized Preoxygenation: Pre-oxygenation with 100% oxygen Intubation Type: IV induction Ventilation: Mask ventilation without difficulty and Oral airway inserted - appropriate to patient size Laryngoscope Size: Mac and 4 Grade View: Grade II Tube type: Oral Tube size: 7.5 mm Number of attempts: 1 Airway Equipment and Method: Stylet Placement Confirmation: ETT inserted through vocal cords under direct vision,  positive ETCO2 and breath sounds checked- equal and bilateral Secured at: 21 cm Tube secured with: Tape Dental Injury: Teeth and Oropharynx as per pre-operative assessment

## 2012-05-14 NOTE — Anesthesia Postprocedure Evaluation (Signed)
Anesthesia Post Note  Patient: Michaela Little  Procedure(s) Performed: Procedure(s) (LRB): LAPAROSCOPIC CHOLECYSTECTOMY (N/A)  Anesthesia type: General  Patient location: PACU  Post pain: Pain level controlled  Post assessment: Patient's Cardiovascular Status Stable  Last Vitals:  Filed Vitals:   05/14/12 1215  BP:   Pulse:   Temp: 36.5 C  Resp:     Post vital signs: Reviewed and stable  Level of consciousness: alert  Complications: No apparent anesthesia complications

## 2012-05-16 ENCOUNTER — Encounter (HOSPITAL_COMMUNITY): Payer: Self-pay | Admitting: General Surgery

## 2012-05-16 ENCOUNTER — Telehealth (INDEPENDENT_AMBULATORY_CARE_PROVIDER_SITE_OTHER): Payer: Self-pay | Admitting: General Surgery

## 2012-05-16 NOTE — Telephone Encounter (Signed)
Patient has po f/u visit on 05/29/12 ref assigned .

## 2012-05-21 ENCOUNTER — Telehealth (INDEPENDENT_AMBULATORY_CARE_PROVIDER_SITE_OTHER): Payer: Self-pay | Admitting: General Surgery

## 2012-05-21 NOTE — Telephone Encounter (Signed)
Pt called to ask about "dressing changes" as noted in her post op paperwork.  She does not have dressings to change.  Reassured her this is correct, and meant for patients who do have dressings to remove before showering.  Pt then asked if she can shower; Dr. Derrell Lolling said no baths.  Clarified that she is allowed only to shower with soap and water, not to scrub incisions with washcloth, rinse thoroughly and pat dry.  She is not to soak in a bathtub until given the OK by her surgeon.    Pt states she now understands.  Reminded of post op appt.

## 2012-05-28 DIAGNOSIS — R109 Unspecified abdominal pain: Secondary | ICD-10-CM | POA: Diagnosis not present

## 2012-05-28 DIAGNOSIS — J45909 Unspecified asthma, uncomplicated: Secondary | ICD-10-CM | POA: Diagnosis not present

## 2012-05-29 ENCOUNTER — Encounter (INDEPENDENT_AMBULATORY_CARE_PROVIDER_SITE_OTHER): Payer: Self-pay | Admitting: General Surgery

## 2012-05-29 ENCOUNTER — Ambulatory Visit (INDEPENDENT_AMBULATORY_CARE_PROVIDER_SITE_OTHER): Payer: Medicare Other | Admitting: General Surgery

## 2012-05-29 VITALS — BP 130/76 | HR 74 | Temp 98.4°F | Resp 18 | Ht 65.0 in | Wt 254.0 lb

## 2012-05-29 NOTE — Progress Notes (Signed)
Patient ID: Michaela Little, female   DOB: 04-18-1960, 52 y.o.   MRN: [id number] The patient is a 52 year old female status post laparoscopic cholecystectomy for biliary dyskinesia. Patient has been doing well postoperatively aside from some soreness in the right upper quadrant. She continued to have a regular diet. She is having normal bowel function. She does state that the pain in right upper quadrant and nausea has been somewhat relieved after the operation.  On exam: Wounds are clean dry and intact  Pathology: Reveals benign gallbladder  Assessment and plan: 52 year old female status post laparoscopic cholecystectomy Patient can followup when necessary

## 2012-05-31 ENCOUNTER — Encounter (HOSPITAL_COMMUNITY): Payer: Self-pay

## 2012-05-31 ENCOUNTER — Emergency Department (HOSPITAL_COMMUNITY)
Admission: EM | Admit: 2012-05-31 | Discharge: 2012-06-01 | Disposition: A | Discharge status: Home / Self Care | Payer: Medicare Other | Attending: Emergency Medicine | Admitting: Emergency Medicine

## 2012-05-31 DIAGNOSIS — Z853 Personal history of malignant neoplasm of breast: Secondary | ICD-10-CM | POA: Insufficient documentation

## 2012-05-31 DIAGNOSIS — E785 Hyperlipidemia, unspecified: Secondary | ICD-10-CM | POA: Diagnosis not present

## 2012-05-31 DIAGNOSIS — Z8739 Personal history of other diseases of the musculoskeletal system and connective tissue: Secondary | ICD-10-CM | POA: Insufficient documentation

## 2012-05-31 DIAGNOSIS — Z8701 Personal history of pneumonia (recurrent): Secondary | ICD-10-CM | POA: Diagnosis not present

## 2012-05-31 DIAGNOSIS — F411 Generalized anxiety disorder: Secondary | ICD-10-CM | POA: Insufficient documentation

## 2012-05-31 DIAGNOSIS — I10 Essential (primary) hypertension: Secondary | ICD-10-CM | POA: Diagnosis not present

## 2012-05-31 DIAGNOSIS — J45909 Unspecified asthma, uncomplicated: Secondary | ICD-10-CM | POA: Diagnosis not present

## 2012-05-31 DIAGNOSIS — G4733 Obstructive sleep apnea (adult) (pediatric): Secondary | ICD-10-CM | POA: Diagnosis not present

## 2012-05-31 DIAGNOSIS — Z8742 Personal history of other diseases of the female genital tract: Secondary | ICD-10-CM | POA: Diagnosis not present

## 2012-05-31 DIAGNOSIS — R112 Nausea with vomiting, unspecified: Secondary | ICD-10-CM | POA: Insufficient documentation

## 2012-05-31 DIAGNOSIS — Z8709 Personal history of other diseases of the respiratory system: Secondary | ICD-10-CM | POA: Insufficient documentation

## 2012-05-31 DIAGNOSIS — Z79899 Other long term (current) drug therapy: Secondary | ICD-10-CM | POA: Diagnosis not present

## 2012-05-31 DIAGNOSIS — Z87891 Personal history of nicotine dependence: Secondary | ICD-10-CM | POA: Insufficient documentation

## 2012-05-31 DIAGNOSIS — G8929 Other chronic pain: Secondary | ICD-10-CM | POA: Diagnosis not present

## 2012-05-31 DIAGNOSIS — Z9089 Acquired absence of other organs: Secondary | ICD-10-CM | POA: Insufficient documentation

## 2012-05-31 DIAGNOSIS — Z3202 Encounter for pregnancy test, result negative: Secondary | ICD-10-CM | POA: Diagnosis not present

## 2012-05-31 DIAGNOSIS — Z8611 Personal history of tuberculosis: Secondary | ICD-10-CM | POA: Insufficient documentation

## 2012-05-31 DIAGNOSIS — Z9889 Other specified postprocedural states: Secondary | ICD-10-CM | POA: Diagnosis not present

## 2012-05-31 DIAGNOSIS — Z8679 Personal history of other diseases of the circulatory system: Secondary | ICD-10-CM | POA: Diagnosis not present

## 2012-05-31 LAB — CBC WITH DIFFERENTIAL/PLATELET
Basophils Relative: 0 % (ref 0–1)
Eosinophils Absolute: 0.2 10*3/uL (ref 0.0–0.7)
Eosinophils Relative: 4 % (ref 0–5)
HCT: 40.6 % (ref 36.0–46.0)
Hemoglobin: 13.9 g/dL (ref 12.0–15.0)
MCH: 27.9 pg (ref 26.0–34.0)
MCHC: 34.2 g/dL (ref 30.0–36.0)
Monocytes Absolute: 0.4 10*3/uL (ref 0.1–1.0)
Monocytes Relative: 8 % (ref 3–12)

## 2012-05-31 LAB — COMPREHENSIVE METABOLIC PANEL
ALT: 10 U/L (ref 0–35)
AST: 16 U/L (ref 0–37)
Alkaline Phosphatase: 125 U/L — ABNORMAL HIGH (ref 39–117)
CO2: 26 mEq/L (ref 19–32)
Calcium: 9.4 mg/dL (ref 8.4–10.5)
Potassium: 4.1 mEq/L (ref 3.5–5.1)
Sodium: 139 mEq/L (ref 135–145)
Total Protein: 7.8 g/dL (ref 6.0–8.3)

## 2012-05-31 LAB — POCT PREGNANCY, URINE: Preg Test, Ur: NEGATIVE

## 2012-05-31 MED ORDER — SODIUM CHLORIDE 0.9 % IV BOLUS (SEPSIS)
1000.0000 mL | Freq: Once | INTRAVENOUS | Status: AC
  Administered 2012-05-31: 1000 mL via INTRAVENOUS

## 2012-05-31 MED ORDER — ONDANSETRON HCL 4 MG/2ML IJ SOLN
4.0000 mg | Freq: Once | INTRAMUSCULAR | Status: AC
  Administered 2012-05-31: 4 mg via INTRAVENOUS
  Filled 2012-05-31: qty 2

## 2012-05-31 NOTE — ED Notes (Signed)
Pt had cholecystectomy on 02/17.  Pt reports lower abdominal pain and emesis x 1 week.

## 2012-06-01 LAB — URINALYSIS, MICROSCOPIC ONLY
Hgb urine dipstick: NEGATIVE
Ketones, ur: NEGATIVE mg/dL
Protein, ur: NEGATIVE mg/dL
Specific Gravity, Urine: 1.027 (ref 1.005–1.030)
Urobilinogen, UA: 1 mg/dL (ref 0.0–1.0)

## 2012-06-01 MED ORDER — OXYCODONE-ACETAMINOPHEN 5-325 MG PO TABS: 2.0000 | ORAL_TABLET | ORAL | Status: DC | PRN

## 2012-06-01 MED ORDER — ONDANSETRON HCL 4 MG PO TABS: 4.0000 mg | ORAL_TABLET | Freq: Four times a day (QID) | ORAL | Status: DC

## 2012-06-01 NOTE — ED Notes (Signed)
Pt given water. Pt sts feeling nasueated and "like I need to vomit" pt denies vomiting. Laying in bed. Pt. Ambulated to restroom with no difficulty.

## 2012-06-01 NOTE — ED Provider Notes (Signed)
History     CSN: [id number]  Arrival date & time 05/31/12  1546   First MD Initiated Contact with Patient 05/31/12 2303      Chief Complaint  Patient presents with  . Abdominal Pain  . Emesis    (Consider location/radiation/quality/duration/timing/severity/associated sxs/prior treatment) Patient is a 52 y.o. female presenting with abdominal pain and vomiting.  Abdominal Pain Associated symptoms: vomiting   Emesis Associated symptoms: abdominal pain    52 year old female presents emergency apartment complaining of right-sided abdominal pain with nausea and vomiting.  Patient reports nausea and vomiting and pain has been going on for several months.  She had gallbladder removed on the 19th 22-skin showing poor functioning.  She reports she was nauseated after surgery, but her to back with vomiting for one week ago.  Patient wants to know, "what's going on with a".  Patient has been seen by her primary care Dr. and told she has a fibroid.  She's been seen by gastroenterology, who is ordered.  The [facility] skin.  She denies any fevers or chills.  She reports she's been having bowel movements.  Nausea and vomiting is intermittent throughout the day.  She has been able to keep some things down.  Pain is sharp, running up and down her stomach, more on the right than on the left side.  Occasion she will have sharp, shooting pains in the left side.  She denies any sick contacts.  Patient also complains of diffuse red bumps that are itchy.  She's been scratching.  She reports history of dry skin.  No recent contacts with any new allergens Past Medical History  Diagnosis Date  . Uterine fibroid   . Hyperlipidemia   . Hypertension   . Asthma   . SVD (spontaneous vaginal delivery)     x 5  . Seasonal allergies   . Arthritis     hands, knees  . History of pneumonia   . Anginal pain     BEEN GOING ON FOR COUPLE MO  . Anxiety   . Shortness of breath     occasional WITH EXERTIO N  . OSA  (obstructive sleep apnea)     CPAP  , NOT USING ON REGULAR BASIS  . Tuberculosis     AS CHILD MED TREATMENT   . Heart murmur     AS CHILD   . Headache     otc med prn  . Breast cancer 11/2008    left  . Pneumonia     hx    Past Surgical History  Procedure Laterality Date  . Eye surgery  1961    right  . Mastectomy  11/2008    done in [place], left  . Breast surgery  11/2008     done in [place] - breast cancer  . Cervical conization w/bx      done in [place]  . Colonoscopy  2011    normal per patient - [place]  . Dilation and curettage of uterus  10/15/2011    Procedure: DILATATION AND CURETTAGE;  Surgeon: Bing Plume, MD;  Location: [facility];  Service: Gynecology;  Laterality: N/A;  Conization  . Cholecystectomy N/A 05/14/2012    Procedure: LAPAROSCOPIC CHOLECYSTECTOMY;  Surgeon: Axel Filler, MD;  Location: [facility] OR;  Service: General;  Laterality: N/A;    Family History  Problem Relation Age of Onset  . Breast cancer Mother   . Colon cancer Mother   . Hypotension Mother   . Asthma Mother   .  Diabetes type II Mother   . Arthritis Mother   . Clotting disorder Mother   . Cancer Mother     breast/colon  . Mental illness Brother   . Heart disease Brother   . Cerebral palsy Daughter   . Emphysema Brother     never smoker  . Colon cancer Maternal Aunt   . Cancer Maternal Aunt     colon  . Colon cancer Maternal Uncle   . Cancer Maternal Uncle     colon    History  Substance Use Topics  . Smoking status: Former Smoker -- 0.30 packs/day for 10 years    Types: Cigarettes    Quit date: 03/26/1988  . Smokeless tobacco: Never Used  . Alcohol Use: No    OB History   Grav Para Term Preterm Abortions TAB SAB Ect Mult Living   10 5 5  5 2 3   5       Review of Systems  Gastrointestinal: Positive for vomiting and abdominal pain.  All other systems reviewed and are negative.    Allergies  Calcium carbonate antacid; Hctz; and Lisinopril  Home Medications   Current Outpatient Rx   Name  Route  Sig  Dispense  Refill  . albuterol (PROVENTIL HFA;VENTOLIN HFA) 108 (90 BASE) MCG/ACT inhaler   Inhalation   Inhale 2 puffs into the lungs every 6 (six) hours as needed. For wheezing         . furosemide (LASIX) 40 MG tablet   Oral   Take 40 mg by mouth daily as needed. For leg swelling         . meloxicam (MOBIC) 15 MG tablet   Oral   Take 15 mg by mouth daily.         Marland Kitchen amLODipine (NORVASC) 10 MG tablet   Oral   Take 10 mg by mouth daily.         Marland Kitchen anastrozole (ARIMIDEX) 1 MG tablet   Oral   Take 1 mg by mouth daily.          . fluticasone (FLOVENT HFA) 44 MCG/ACT inhaler   Inhalation   Inhale 2 puffs into the lungs 2 (two) times daily as needed. For asthma         . loratadine (CLARITIN) 10 MG tablet   Oral   Take 10 mg by mouth daily as needed for allergies.          Marland Kitchen metoCLOPramide (REGLAN) 10 MG tablet   Oral   Take 10 mg by mouth every 8 (eight) hours.          . montelukast (SINGULAIR) 10 MG tablet   Oral   Take 10 mg by mouth at bedtime.         Marland Kitchen omeprazole (PRILOSEC) 20 MG capsule   Oral   Take 20 mg by mouth at bedtime.         . ondansetron (ZOFRAN) 4 MG tablet   Oral   Take 1 tablet (4 mg total) by mouth every 6 (six) hours.   12 tablet   0   . oxyCODONE-acetaminophen (PERCOCET/ROXICET) 5-325 MG per tablet   Oral   Take 2 tablets by mouth every 4 (four) hours as needed for pain.   20 tablet   0   . polyethylene glycol (MIRALAX / GLYCOLAX) packet   Oral   Take 17 g by mouth daily as needed. For constipation           BP  147/98  Pulse 80  Temp(Src) 98.6 F (37 C) (Oral)  Resp 17  SpO2 97%  LMP 02/25/2009  Physical Exam  Nursing note and vitals reviewed. Constitutional: She is oriented to person, place, and time. She appears well-developed and well-nourished.  Morbidly obese  HENT:  Head: Normocephalic and atraumatic.  Nose: Nose normal.  Mouth/Throat: Oropharynx is clear and moist.  Eyes: EOM  are normal. Pupils are equal, round, and reactive to light.  Neck: Normal range of motion. Neck supple. No JVD present. No tracheal deviation present. No thyromegaly present.  Cardiovascular: Normal rate, regular rhythm, normal heart sounds and intact distal pulses.  Exam reveals no gallop and no friction rub.   No murmur heard. Pulmonary/Chest: Effort normal and breath sounds normal. No stridor. No respiratory distress. She has no wheezes. She has no rales. She exhibits no tenderness.  Abdominal: Soft. Bowel sounds are normal. She exhibits no distension and no mass. There is tenderness (diffuse tenderness, worse on the right than on the left.  No masses, no rebound or guarding). There is no rebound and no guarding.  Musculoskeletal: Normal range of motion. She exhibits no edema and no tenderness.  Lymphadenopathy:    She has no cervical adenopathy.  Neurological: She is alert and oriented to person, place, and time. She exhibits normal muscle tone. Coordination normal.  Skin: Skin is warm and dry. No rash noted. No erythema. No pallor.  Psychiatric: She has a normal mood and affect. Her behavior is normal. Judgment and thought content normal.    ED Course  Procedures (including critical care time)  Labs Reviewed  COMPREHENSIVE METABOLIC PANEL - Abnormal; Notable for the following:    Alkaline Phosphatase 125 (*)    All other components within normal limits  URINALYSIS, MICROSCOPIC ONLY - Abnormal; Notable for the following:    Color, Urine AMBER (*)    APPearance CLOUDY (*)    Bilirubin Urine SMALL (*)    Crystals CA OXALATE CRYSTALS (*)    All other components within normal limits  CBC WITH DIFFERENTIAL  POCT PREGNANCY, URINE   No results found.   1. Nausea and vomiting   2. Chronic abdominal pain       MDM  52 year old female with ongoing chronic abdominal pain with nausea and vomiting.  Labs are unremarkable.  No signs of postop infection.  Refer her to her  gastroenterologist and primary care.  We'll start her on Zofran and Percocet.  In the meantime.         Olivia Mackie, MD 06/01/12 [phone]

## 2012-06-19 ENCOUNTER — Emergency Department (HOSPITAL_COMMUNITY)
Admission: EM | Admit: 2012-06-19 | Discharge: 2012-06-19 | Disposition: A | Discharge status: Home / Self Care | Payer: Medicare Other | Attending: Emergency Medicine | Admitting: Emergency Medicine

## 2012-06-19 ENCOUNTER — Encounter (HOSPITAL_COMMUNITY): Payer: Self-pay | Admitting: *Deleted

## 2012-06-19 ENCOUNTER — Emergency Department (HOSPITAL_COMMUNITY): Payer: Medicare Other

## 2012-06-19 DIAGNOSIS — J40 Bronchitis, not specified as acute or chronic: Secondary | ICD-10-CM

## 2012-06-19 DIAGNOSIS — Z8701 Personal history of pneumonia (recurrent): Secondary | ICD-10-CM | POA: Diagnosis not present

## 2012-06-19 DIAGNOSIS — IMO0002 Reserved for concepts with insufficient information to code with codable children: Secondary | ICD-10-CM | POA: Insufficient documentation

## 2012-06-19 DIAGNOSIS — I10 Essential (primary) hypertension: Secondary | ICD-10-CM | POA: Diagnosis not present

## 2012-06-19 DIAGNOSIS — Z853 Personal history of malignant neoplasm of breast: Secondary | ICD-10-CM | POA: Diagnosis not present

## 2012-06-19 DIAGNOSIS — R011 Cardiac murmur, unspecified: Secondary | ICD-10-CM | POA: Insufficient documentation

## 2012-06-19 DIAGNOSIS — R062 Wheezing: Secondary | ICD-10-CM | POA: Diagnosis not present

## 2012-06-19 DIAGNOSIS — Z87891 Personal history of nicotine dependence: Secondary | ICD-10-CM | POA: Diagnosis not present

## 2012-06-19 DIAGNOSIS — Z8742 Personal history of other diseases of the female genital tract: Secondary | ICD-10-CM | POA: Diagnosis not present

## 2012-06-19 DIAGNOSIS — G4733 Obstructive sleep apnea (adult) (pediatric): Secondary | ICD-10-CM | POA: Insufficient documentation

## 2012-06-19 DIAGNOSIS — Z8611 Personal history of tuberculosis: Secondary | ICD-10-CM | POA: Diagnosis not present

## 2012-06-19 DIAGNOSIS — J209 Acute bronchitis, unspecified: Secondary | ICD-10-CM | POA: Diagnosis not present

## 2012-06-19 DIAGNOSIS — R509 Fever, unspecified: Secondary | ICD-10-CM | POA: Diagnosis not present

## 2012-06-19 DIAGNOSIS — J45909 Unspecified asthma, uncomplicated: Secondary | ICD-10-CM | POA: Insufficient documentation

## 2012-06-19 DIAGNOSIS — F411 Generalized anxiety disorder: Secondary | ICD-10-CM | POA: Diagnosis not present

## 2012-06-19 DIAGNOSIS — Z79899 Other long term (current) drug therapy: Secondary | ICD-10-CM | POA: Diagnosis not present

## 2012-06-19 DIAGNOSIS — Z8739 Personal history of other diseases of the musculoskeletal system and connective tissue: Secondary | ICD-10-CM | POA: Diagnosis not present

## 2012-06-19 DIAGNOSIS — R05 Cough: Secondary | ICD-10-CM | POA: Diagnosis not present

## 2012-06-19 DIAGNOSIS — Z8679 Personal history of other diseases of the circulatory system: Secondary | ICD-10-CM | POA: Diagnosis not present

## 2012-06-19 DIAGNOSIS — E785 Hyperlipidemia, unspecified: Secondary | ICD-10-CM | POA: Diagnosis not present

## 2012-06-19 LAB — POCT I-STAT, CHEM 8
Chloride: 106 mEq/L (ref 96–112)
Glucose, Bld: 103 mg/dL — ABNORMAL HIGH (ref 70–99)
HCT: 41 % (ref 36.0–46.0)
Potassium: 4.1 mEq/L (ref 3.5–5.1)

## 2012-06-19 LAB — CBC
HCT: 39.8 % (ref 36.0–46.0)
RDW: 13.5 % (ref 11.5–15.5)
WBC: 5.3 10*3/uL (ref 4.0–10.5)

## 2012-06-19 MED ORDER — AZITHROMYCIN 250 MG PO TABS: 250.0000 mg | ORAL_TABLET | Freq: Every day | ORAL | Status: DC

## 2012-06-19 MED ORDER — DEXAMETHASONE SODIUM PHOSPHATE 10 MG/ML IJ SOLN
10.0000 mg | Freq: Once | INTRAMUSCULAR | Status: AC
  Administered 2012-06-19: 10 mg via INTRAMUSCULAR
  Filled 2012-06-19: qty 1

## 2012-06-19 NOTE — ED Notes (Signed)
PT to ED c/o cough for several days.  She had her gallbladder removed in Feb and her Dr stated she should come to the ED.  PT is coughing up green/yellow phlegm and c/o pain, esp to R upper back and ribs whenever she coughs.  Diminished R post lung sounds.

## 2012-06-19 NOTE — ED Provider Notes (Signed)
History     CSN: [id number]  Arrival date & time 06/19/12  1141   First MD Initiated Contact with Patient 06/19/12 1251      Chief Complaint  Patient presents with  . Cough     HPI PT to ED c/o cough for several days. She had her gallbladder removed in Feb and her Dr stated she should come to the ED. PT is coughing up green/yellow phlegm and c/o pain, esp to R upper back and ribs whenever she coughs. Diminished R post lung sounds.  Past Medical History  Diagnosis Date  . Uterine fibroid   . Hyperlipidemia   . Hypertension   . Asthma   . SVD (spontaneous vaginal delivery)     x 5  . Seasonal allergies   . Arthritis     hands, knees  . History of pneumonia   . Anginal pain     BEEN GOING ON FOR COUPLE MO  . Anxiety   . Shortness of breath     occasional WITH EXERTIO N  . OSA (obstructive sleep apnea)     CPAP  , NOT USING ON REGULAR BASIS  . Tuberculosis     AS CHILD MED TREATMENT   . Heart murmur     AS CHILD   . Headache     otc med prn  . Breast cancer 11/2008    left  . Pneumonia     hx    Past Surgical History  Procedure Laterality Date  . Eye surgery  1961    right  . Cervical conization w/bx      done in [place]  . Colonoscopy  2011    normal per patient - [place]  . Dilation and curettage of uterus  10/15/2011    Procedure: DILATATION AND CURETTAGE;  Surgeon: Bing Plume, MD;  Location: [facility];  Service: Gynecology;  Laterality: N/A;  Conization  . Cholecystectomy N/A 05/14/2012    Procedure: LAPAROSCOPIC CHOLECYSTECTOMY;  Surgeon: Axel Filler, MD;  Location: [facility] OR;  Service: General;  Laterality: N/A;  . Breast surgery  11/2008     done in [place] - breast cancer  . Mastectomy  11/2008    done in [place], left    Family History  Problem Relation Age of Onset  . Breast cancer Mother   . Colon cancer Mother   . Hypotension Mother   . Asthma Mother   . Diabetes type II Mother   . Arthritis Mother   . Clotting disorder Mother   . Cancer Mother    breast/colon  . Mental illness Brother   . Heart disease Brother   . Cerebral palsy Daughter   . Emphysema Brother     never smoker  . Colon cancer Maternal Aunt   . Cancer Maternal Aunt     colon  . Colon cancer Maternal Uncle   . Cancer Maternal Uncle     colon    History  Substance Use Topics  . Smoking status: Former Smoker -- 0.30 packs/day for 10 years    Types: Cigarettes    Quit date: 03/26/1988  . Smokeless tobacco: Never Used  . Alcohol Use: No    OB History   Grav Para Term Preterm Abortions TAB SAB Ect Mult Living   10 5 5  5 2 3   5       Review of Systems  All other systems reviewed and are negative.    Allergies  Calcium carbonate antacid; Hctz; and  Lisinopril  Home Medications   Current Outpatient Rx  Name  Route  Sig  Dispense  Refill  . albuterol (PROVENTIL HFA;VENTOLIN HFA) 108 (90 BASE) MCG/ACT inhaler   Inhalation   Inhale 2 puffs into the lungs every 6 (six) hours as needed. For wheezing         . amLODipine (NORVASC) 10 MG tablet   Oral   Take 10 mg by mouth daily.         Marland Kitchen anastrozole (ARIMIDEX) 1 MG tablet   Oral   Take 1 mg by mouth daily.          . fluticasone (FLOVENT HFA) 44 MCG/ACT inhaler   Inhalation   Inhale 2 puffs into the lungs 2 (two) times daily as needed. For asthma         . furosemide (LASIX) 40 MG tablet   Oral   Take 40 mg by mouth daily as needed. For leg swelling         . loratadine (CLARITIN) 10 MG tablet   Oral   Take 10 mg by mouth daily as needed for allergies.          . meloxicam (MOBIC) 15 MG tablet   Oral   Take 15 mg by mouth daily.         . metoCLOPramide (REGLAN) 10 MG tablet   Oral   Take 10 mg by mouth every 8 (eight) hours.          . montelukast (SINGULAIR) 10 MG tablet   Oral   Take 10 mg by mouth at bedtime.         Marland Kitchen omeprazole (PRILOSEC) 20 MG capsule   Oral   Take 20 mg by mouth at bedtime.         . ondansetron (ZOFRAN) 4 MG tablet   Oral   Take 1  tablet (4 mg total) by mouth every 6 (six) hours.   12 tablet   0   . oxyCODONE-acetaminophen (PERCOCET/ROXICET) 5-325 MG per tablet   Oral   Take 2 tablets by mouth every 4 (four) hours as needed for pain.   20 tablet   0   . polyethylene glycol (MIRALAX / GLYCOLAX) packet   Oral   Take 17 g by mouth daily as needed. For constipation           BP 139/89  Pulse 77  Temp(Src) 98.2 F (36.8 C) (Oral)  Resp 18  SpO2 98%  LMP 02/25/2009  Physical Exam  Nursing note and vitals reviewed. Constitutional: She is oriented to person, place, and time. She appears well-developed and well-nourished. No distress.  HENT:  Head: Normocephalic and atraumatic.  Eyes: Pupils are equal, round, and reactive to light.  Neck: Normal range of motion.  Cardiovascular: Normal rate and intact distal pulses.   Pulmonary/Chest: No respiratory distress. She has wheezes (Scattered mild wheezes on expiration throughout all lung fields).  Abdominal: Normal appearance. She exhibits no distension.  Musculoskeletal: Normal range of motion.  Neurological: She is alert and oriented to person, place, and time. No cranial nerve deficit.  Skin: Skin is warm and dry. No rash noted.  Psychiatric: She has a normal mood and affect. Her behavior is normal.    ED Course  Procedures (including critical care time) Meds ordered this encounter  Medications  . dexamethasone (DECADRON) injection 10 mg    Sig:     Labs Reviewed  POCT I-STAT, CHEM 8 - Abnormal; Notable for the following:  Glucose, Bld 103 (*)    All other components within normal limits  CBC   Dg Chest 2 View (if Patient Has Fever And/or Copd)  06/19/2012  *RADIOLOGY REPORT*  Clinical Data: Cough, congestion and wheezing.  CHEST - 2 VIEW  Comparison: PA and lateral chest 10/23/2011 and 01/10/2012.  Findings: The patient is status post left mastectomy axillary dissection with a breast implant in place.  The lungs are clear. Heart size is normal.   No pneumothorax or pleural effusion.  No focal bony abnormality.  IMPRESSION: No acute disease.   Original Report Authenticated By: Holley Dexter, M.D.      1. Bronchitis       MDM         Nelia Shi, MD 06/19/12 [phone]

## 2012-07-02 LAB — HM MAMMOGRAPHY

## 2012-07-07 ENCOUNTER — Ambulatory Visit (HOSPITAL_BASED_OUTPATIENT_CLINIC_OR_DEPARTMENT_OTHER): Payer: Medicare Other | Admitting: Oncology

## 2012-07-07 ENCOUNTER — Other Ambulatory Visit (HOSPITAL_BASED_OUTPATIENT_CLINIC_OR_DEPARTMENT_OTHER): Payer: Medicare Other

## 2012-07-07 ENCOUNTER — Telehealth: Payer: Self-pay | Admitting: *Deleted

## 2012-07-07 ENCOUNTER — Ambulatory Visit: Payer: Medicare Other | Admitting: Physician Assistant

## 2012-07-07 VITALS — BP 124/79 | HR 73 | Temp 98.5°F | Resp 20 | Ht 65.0 in | Wt 257.7 lb

## 2012-07-07 DIAGNOSIS — C50919 Malignant neoplasm of unspecified site of unspecified female breast: Secondary | ICD-10-CM

## 2012-07-07 DIAGNOSIS — Z853 Personal history of malignant neoplasm of breast: Secondary | ICD-10-CM

## 2012-07-07 DIAGNOSIS — Z9010 Acquired absence of unspecified breast and nipple: Secondary | ICD-10-CM | POA: Diagnosis not present

## 2012-07-07 DIAGNOSIS — R599 Enlarged lymph nodes, unspecified: Secondary | ICD-10-CM | POA: Diagnosis not present

## 2012-07-07 DIAGNOSIS — Z170 Estrogen receptor positive status [ER+]: Secondary | ICD-10-CM | POA: Diagnosis not present

## 2012-07-07 DIAGNOSIS — C50912 Malignant neoplasm of unspecified site of left female breast: Secondary | ICD-10-CM

## 2012-07-07 DIAGNOSIS — J45909 Unspecified asthma, uncomplicated: Secondary | ICD-10-CM

## 2012-07-07 LAB — COMPREHENSIVE METABOLIC PANEL (CC13)
BUN: 9 mg/dL (ref 7.0–26.0)
CO2: 27 mEq/L (ref 22–29)
Calcium: 9.2 mg/dL (ref 8.4–10.4)
Chloride: 105 mEq/L (ref 98–107)
Creatinine: 0.8 mg/dL (ref 0.6–1.1)
Glucose: 104 mg/dl — ABNORMAL HIGH (ref 70–99)

## 2012-07-07 LAB — CBC WITH DIFFERENTIAL/PLATELET
Basophils Absolute: 0 10*3/uL (ref 0.0–0.1)
Eosinophils Absolute: 0.2 10*3/uL (ref 0.0–0.5)
HCT: 41.3 % (ref 34.8–46.6)
HGB: 13.3 g/dL (ref 11.6–15.9)
MCH: 27.1 pg (ref 25.1–34.0)
MONO#: 0.3 10*3/uL (ref 0.1–0.9)
NEUT%: 57.6 % (ref 38.4–76.8)
WBC: 4.7 10*3/uL (ref 3.9–10.3)
lymph#: 1.4 10*3/uL (ref 0.9–3.3)

## 2012-07-07 LAB — HM MAMMOGRAPHY

## 2012-07-07 NOTE — Progress Notes (Signed)
ID: [name]   DOB: 07-May-1960  MR#: [id number]  WUJ#:[id number]  PCP: Oneal Grout, MD SU:  Axel Filler GYN: Sabas Sous, MD OTHER MD: Etter Sjogren, Stan Head, Sandrea Hughs  HISTORY OF PRESENT ILLNESS: Michaela Little is a [place] woman who moved to [facility] in 2013 with a history of breast cancer, and establishied herself with our practice.  The patient had left breast and left axillary lymph node biopsy performed March of 2010, both positive (I do not have the pathology report at this point). She was treated neo-adjuvantly at [facility] with (a) paclitaxel weekly x12 and (b) doxorubicin/cyclophosphamide in dose dense fashion x4, both given with tifiparnib.   She then proceeded to left modified radical mastectomy 12/23/2008, the final pathology showing a 4 cm residual invasive lobular carcinoma, involving 5/22 lymph nodes sampled. The tumor was estrogen and progesterone receptor positive, HER-2 negative.   Postoperatively she received 50 gray of radiation completed January of 2011, including the left supraclavicular lymph node basin. She was then started on tamoxifen, but developed some uterine lining thickening and was switched to anastrozole in May 2012. Her subsequent history is as detailed below  INTERVAL HISTORY: Michaela Little returns today for followup of her breast cancer. Since her last visit here she underwent cholecystectomy. This seems to have improved the abdominal pain, but not the nausea and vomiting, which occasionally persists.   REVIEW OF SYSTEMS: She denies constipation or abdominal cramps. She sleeps poorly. She feels some blurring in her right eye. She has sinus symptoms. Sometimes she has mouth sores, but not currently. She has just been at times, but not currently. She gets short of breath when walking up stairs. She keeps a dry cough. Sometimes both her breast her to. She also is back and joint pain, which is inconstant. She describes herself is anxious,  but not depressed. A detailed review of systems was otherwise stable.    PAST MEDICAL HISTORY: Past Medical History  Diagnosis Date  . Uterine fibroid   . Hyperlipidemia   . Hypertension   . Asthma   . SVD (spontaneous vaginal delivery)     x 5  . Seasonal allergies   . Arthritis     hands, knees  . History of pneumonia   . Anginal pain     BEEN GOING ON FOR COUPLE MO  . Anxiety   . Shortness of breath     occasional WITH EXERTIO N  . OSA (obstructive sleep apnea)     CPAP  , NOT USING ON REGULAR BASIS  . Tuberculosis     AS CHILD MED TREATMENT   . Heart murmur     AS CHILD   . Headache     otc med prn  . Breast cancer 11/2008    left  . Pneumonia     hx    PAST SURGICAL HISTORY: Past Surgical History  Procedure Laterality Date  . Eye surgery  1961    right  . Cervical conization w/bx      done in [place]  . Colonoscopy  2011    normal per patient - [place]  . Dilation and curettage of uterus  10/15/2011    Procedure: DILATATION AND CURETTAGE;  Surgeon: Bing Plume, MD;  Location: [facility] ORS;  Service: Gynecology;  Laterality: N/A;  Conization  . Cholecystectomy N/A 05/14/2012    Procedure: LAPAROSCOPIC CHOLECYSTECTOMY;  Surgeon: Axel Filler, MD;  Location: [facility];  Service: General;  Laterality: N/A;  . Breast surgery  11/2008     done in [place] - breast cancer  . Mastectomy  11/2008    done in [place], left    FAMILY HISTORY Family History  Problem Relation Age of Onset  . Breast cancer Mother   . Colon cancer Mother   . Hypotension Mother   . Asthma Mother   . Diabetes type II Mother   . Arthritis Mother   . Clotting disorder Mother   . Cancer Mother     breast/colon  . Mental illness Brother   . Heart disease Brother   . Cerebral palsy Daughter   . Emphysema Brother     never smoker  . Colon cancer Maternal Aunt   . Cancer Maternal Aunt     colon  . Colon cancer Maternal Uncle   . Cancer Maternal Uncle     colon   Gynecologic history: She is GX P5. At  first pregnancy to term age 1. Menarche age 8. Last menstrual period when she had her chemotherapy.   Social History: She is disabled secondary to arthritis. She moved from [place] to [place] for "peace of mind". Of her 5 children only one daughter is living in [place], [place]. One daughter in [place] is incarcerated and another one is "in a lot of stress". The patient lives alone. She mostly reads and does computer games on her phone during the day. She tells me she is starting at a stretching program and would eventually like to start an exercise program. She has 20 grandchildren.    ADVANCED DIRECTIVES: She does not have a healthcare power of attorney.    HEALTH MAINTENANCE: History  Substance Use Topics  . Smoking status: Former Smoker -- 0.30 packs/day for 10 years    Types: Cigarettes    Quit date: 03/26/1988  . Smokeless tobacco: Never Used  . Alcohol Use: No     Colonoscopy:  PAP:  Bone density:  Lipid panel:  Allergies  Allergen Reactions  . Calcium Carbonate Antacid Hives    Fruit flavored Tums   . Hctz (Hydrochlorothiazide) Itching  . Lisinopril Itching    Current Outpatient Prescriptions  Medication Sig Dispense Refill  . albuterol (PROVENTIL HFA;VENTOLIN HFA) 108 (90 BASE) MCG/ACT inhaler Inhale 2 puffs into the lungs every 6 (six) hours as needed. For wheezing      . amLODipine (NORVASC) 10 MG tablet Take 10 mg by mouth daily.      Marland Kitchen anastrozole (ARIMIDEX) 1 MG tablet Take 1 mg by mouth daily.       Marland Kitchen azithromycin (ZITHROMAX) 250 MG tablet Take 1 tablet (250 mg total) by mouth daily. Take first 2 tablets together, then 1 every day until finished.  6 tablet  0  . fluticasone (FLOVENT HFA) 44 MCG/ACT inhaler Inhale 2 puffs into the lungs 2 (two) times daily as needed. For asthma      . furosemide (LASIX) 40 MG tablet Take 40 mg by mouth daily as needed. For leg swelling      . loratadine (CLARITIN) 10 MG tablet Take 10 mg by mouth daily as needed for  allergies.       . meloxicam (MOBIC) 15 MG tablet Take 15 mg by mouth daily.      . metoCLOPramide (REGLAN) 10 MG tablet Take 10 mg by mouth every 8 (eight) hours.       . montelukast (SINGULAIR) 10 MG tablet Take 10 mg by mouth at bedtime.      Marland Kitchen omeprazole (PRILOSEC)  20 MG capsule Take 20 mg by mouth at bedtime.      . ondansetron (ZOFRAN) 4 MG tablet Take 1 tablet (4 mg total) by mouth every 6 (six) hours.  12 tablet  0  . oxyCODONE-acetaminophen (PERCOCET/ROXICET) 5-325 MG per tablet Take 2 tablets by mouth every 4 (four) hours as needed for pain.  20 tablet  0  . polyethylene glycol (MIRALAX / GLYCOLAX) packet Take 17 g by mouth daily as needed. For constipation       No current facility-administered medications for this visit.    OBJECTIVE: Middle-aged [place] American woman who appears chronically fatigued  Filed Vitals:   07/07/12 1337  BP: 124/79  Pulse: 73  Temp: 98.5 F (36.9 C)  Resp: 20     Body mass index is 42.88 kg/(m^2).    ECOG FS: 1 Filed Weights   07/07/12 1337  Weight: 257 lb 11.2 oz (116.892 kg)    Sclerae unicteric Oropharynx clear No cervical or supraclavicular adenopathy Lungs no rales or rhonchi Heart regular rate and rhythm, no murmur appreciated Abdomen obese,  Mildlytender to palpation in the right upper quadrant; positive bowel sounds; a well-healed laparoscopic cholecystectomy scars MSK no focal spinal tenderness Neuro: nonfocal, well oriented, pleasant affect Breasts: Right breast is unremarkable. Patient status post left mastectomy with expander in place. The skin is diet. There is no evidence of disease recurrence. Axillae are benign bilaterally     LAB RESULTS: Lab Results  Component Value Date   WBC 4.7 07/07/2012   NEUTROABS 2.7 07/07/2012   HGB 13.3 07/07/2012   HCT 41.3 07/07/2012   MCV 84.0 07/07/2012   PLT 225 07/07/2012      Chemistry      Component Value Date/Time   NA 142 06/19/2012 1218   NA 138 01/17/2012 1034   K 4.1  06/19/2012 1218   K 4.3 01/17/2012 1034   CL 106 06/19/2012 1218   CL 106 01/17/2012 1034   CO2 26 05/31/2012 1716   CO2 24 01/17/2012 1034   BUN 14 06/19/2012 1218   BUN 11.0 01/17/2012 1034   CREATININE 0.70 06/19/2012 1218   CREATININE 0.7 01/17/2012 1034      Component Value Date/Time   CALCIUM 9.4 05/31/2012 1716   CALCIUM 9.7 01/17/2012 1034   ALKPHOS 125* 05/31/2012 1716   ALKPHOS 139 01/17/2012 1034   AST 16 05/31/2012 1716   AST 16 01/17/2012 1034   ALT 10 05/31/2012 1716   ALT 12 01/17/2012 1034   BILITOT 0.8 05/31/2012 1716   BILITOT 0.80 01/17/2012 1034       Lab Results  Component Value Date   LABCA2 19 01/17/2012     STUDIES: Dg Chest 2 View (if Patient Has Fever And/or Copd)  06/19/2012  *RADIOLOGY REPORT*  Clinical Data: Cough, congestion and wheezing.  CHEST - 2 VIEW  Comparison: PA and lateral chest 10/23/2011 and 01/10/2012.  Findings: The patient is status post left mastectomy axillary dissection with a breast implant in place.  The lungs are clear. Heart size is normal.  No pneumothorax or pleural effusion.  No focal bony abnormality.  IMPRESSION: No acute disease.   Original Report Authenticated By: Holley Dexter, M.D.    ASSESSMENT: 52 y.o.  [place] woman relocated here from [place],    (1)  left-sided breast cancer diagnosed March of 2010 and treated neo-adjuvantly at [facility] with (a) paclitaxel weekly x12 and (b) doxorubicin/cyclophosphamide in dose dense fashion x4, both given with tifiparnib.   (2)  definitive left modified radical mastectomy September of 2010 for a T2 N2 or stage IIIA invasive lobular breast cancer, estrogen and progesterone receptor positive, HER-2 negative,   (3)  post mastectomy radiation completed January of 2011,  (4) briefly on tamoxifen, discontinued it because of uterine lining concerns.  On anastrozole since May 2012 with good tolerance. Held between November and December 2013 due to GI issues, unrelated to cancer or its  treatment.  (5)  Other problems include left upper extremity lymphedema, borderline concern regarding genetic family history, history of cervical dysplasia, and history of dense breasts.  PLAN: Michaela Little was overdue for her right mammogram and we are operationalizing bath. We have several programs here that I think she will benefit from an are navigator met with her to give her more details. [facility] also [facility]  Otherwise the plan is to continue anastrozole on until she completes 5 years. She knows to call for any problems that may develop before the next visit.    MAGRINAT,GUSTAV C    07/07/2012

## 2012-07-07 NOTE — Telephone Encounter (Signed)
appts made and printed...td 

## 2012-07-10 ENCOUNTER — Other Ambulatory Visit: Payer: Self-pay | Admitting: Oncology

## 2012-07-10 DIAGNOSIS — C50919 Malignant neoplasm of unspecified site of unspecified female breast: Secondary | ICD-10-CM

## 2012-07-10 DIAGNOSIS — Z780 Asymptomatic menopausal state: Secondary | ICD-10-CM | POA: Diagnosis not present

## 2012-07-10 DIAGNOSIS — N6489 Other specified disorders of breast: Secondary | ICD-10-CM | POA: Diagnosis not present

## 2012-08-26 ENCOUNTER — Other Ambulatory Visit (HOSPITAL_BASED_OUTPATIENT_CLINIC_OR_DEPARTMENT_OTHER): Payer: Medicare Other | Admitting: Lab

## 2012-08-26 DIAGNOSIS — C50919 Malignant neoplasm of unspecified site of unspecified female breast: Secondary | ICD-10-CM | POA: Diagnosis not present

## 2012-08-26 LAB — HEPATIC FUNCTION PANEL
ALT: 8 U/L (ref 0–35)
AST: 15 U/L (ref 0–37)
Bilirubin, Direct: 0.2 mg/dL (ref 0.0–0.3)
Total Protein: 6.8 g/dL (ref 6.0–8.3)

## 2012-09-01 ENCOUNTER — Encounter: Payer: Self-pay | Admitting: *Deleted

## 2012-09-02 ENCOUNTER — Encounter: Payer: Self-pay | Admitting: Internal Medicine

## 2012-09-02 ENCOUNTER — Ambulatory Visit (INDEPENDENT_AMBULATORY_CARE_PROVIDER_SITE_OTHER): Payer: Medicare Other | Admitting: Internal Medicine

## 2012-09-02 VITALS — BP 144/86 | HR 60 | Temp 98.0°F | Resp 14 | Ht 65.0 in | Wt 260.6 lb

## 2012-09-02 DIAGNOSIS — N949 Unspecified condition associated with female genital organs and menstrual cycle: Secondary | ICD-10-CM | POA: Diagnosis not present

## 2012-09-02 DIAGNOSIS — I10 Essential (primary) hypertension: Secondary | ICD-10-CM | POA: Diagnosis not present

## 2012-09-02 DIAGNOSIS — E669 Obesity, unspecified: Secondary | ICD-10-CM | POA: Diagnosis not present

## 2012-09-02 DIAGNOSIS — R102 Pelvic and perineal pain: Secondary | ICD-10-CM

## 2012-09-02 DIAGNOSIS — J45909 Unspecified asthma, uncomplicated: Secondary | ICD-10-CM

## 2012-09-02 DIAGNOSIS — G8929 Other chronic pain: Secondary | ICD-10-CM | POA: Insufficient documentation

## 2012-09-02 DIAGNOSIS — R109 Unspecified abdominal pain: Secondary | ICD-10-CM

## 2012-09-02 NOTE — Progress Notes (Signed)
  Subjective:    Patient ID: Michaela Little, female    DOB: [birth year], 52 y.o.   MRN: [id number]  HPI 52 y/o female patient with history of breast cancer, chronic abdominal and pelvic pain, HTN among others is here for routine follow up visit. She continues to have pelvic discomfort. She was seen by Gyn in [facility] and had pelvic usg. No notes available for review. She mentions that she does not agree with the Gyn findings and would like a third opinion. She wants to be seen by gyn-onc specialist and will schedule this herself She underwent cholecystectomy after HIDA scan with chronic Right quadrant pain. She tolerated the procedure well.. She continues to have pain right quadrant and feels a knot on both her pelvic sides. She has ocassisoional nausea and vomiting. She is stopped taking her PPI as she felt it was not helping with her symptoms. She has stopped taking her prn lasix for now Her bp is under control Compliant with arimidex No recent asthma attacks Has regular bowel movement  [facility] Dr Leone Payor Oncology Dr Darnelle Catalan  Review of Systems  Constitutional: Negative for fever, chills, appetite change and fatigue.  HENT: Negative for congestion and mouth sores.   Eyes: Negative for visual disturbance.  Respiratory: Negative for chest tightness and shortness of breath.   Cardiovascular: Negative for chest pain, palpitations and leg swelling.  Gastrointestinal: Positive for nausea, abdominal pain and constipation. Negative for vomiting.  Genitourinary: Positive for pelvic pain. Negative for dysuria, frequency and vaginal bleeding.  Musculoskeletal: Positive for arthralgias.  Skin: Negative for rash.  Neurological: Negative for dizziness, syncope and weakness.  Hematological: Negative for adenopathy.  Psychiatric/Behavioral: Negative for suicidal ideas, confusion, dysphoric mood and agitation. The patient is not nervous/anxious.    Reviewed her  mammogram and dexa scan reports    Objective:   Physical Exam  BP 144/86  Pulse 60  Temp(Src) 98 F (36.7 C) (Oral)  Resp 14  Ht 5\' 5"  (1.651 m)  Wt 260 lb 9.6 oz (118.207 kg)  BMI 43.37 kg/m2  LMP 02/25/2009  gen- obese adult female in NAD heent- no pallor or icterus, no LAD, MMM cvs- n s1,s2, rrr respi- b/l cta, no wheeze or rhonchi abdo- bs+, soft, non tender, no palpable mass/ knots Ext- able to mvoe all 4, no edema Neuro- aaox3, non focal     Assessment & Plan:   Breast neoplasm- diagnosed with breast cancer in 05/2008 and follows with Dr Darnelle Catalan. Continue arimidex  Obesity- no change in her weight. She has been having sedentary lifestyle. Not following any diet. Encouraged her to lose weight and this will help with her overall well being  HTN- bp well controlled this visit. No changes will be made to her medications. Continue current regimen  Asthma- stable on current regimen, continue flovent and singulair  Chronic abdominal pain- persists. Has undergone extensive workup and seen by GI and gyn and pt wants second opinion from other specialists for now. No palpable mass on exam. Unable to identify a cause for this. I feel her fibroid could be contributing to the discomfort. No change in bowel movements. With pain being the only symptoms IBD is unlikely.

## 2012-10-01 ENCOUNTER — Observation Stay (HOSPITAL_COMMUNITY)
Admission: AD | Admit: 2012-10-01 | Discharge: 2012-10-02 | Disposition: A | Discharge status: Home / Self Care | Payer: Medicare Other | Attending: Internal Medicine | Admitting: Internal Medicine

## 2012-10-01 ENCOUNTER — Emergency Department (HOSPITAL_COMMUNITY): Payer: Medicare Other

## 2012-10-01 ENCOUNTER — Encounter (HOSPITAL_COMMUNITY): Payer: Self-pay | Admitting: *Deleted

## 2012-10-01 ENCOUNTER — Observation Stay (HOSPITAL_COMMUNITY): Payer: Medicare Other

## 2012-10-01 DIAGNOSIS — D259 Leiomyoma of uterus, unspecified: Secondary | ICD-10-CM | POA: Diagnosis not present

## 2012-10-01 DIAGNOSIS — J9819 Other pulmonary collapse: Secondary | ICD-10-CM | POA: Diagnosis not present

## 2012-10-01 DIAGNOSIS — E785 Hyperlipidemia, unspecified: Secondary | ICD-10-CM | POA: Diagnosis not present

## 2012-10-01 DIAGNOSIS — R0789 Other chest pain: Secondary | ICD-10-CM | POA: Diagnosis not present

## 2012-10-01 DIAGNOSIS — J45909 Unspecified asthma, uncomplicated: Secondary | ICD-10-CM | POA: Diagnosis not present

## 2012-10-01 DIAGNOSIS — R0602 Shortness of breath: Secondary | ICD-10-CM | POA: Insufficient documentation

## 2012-10-01 DIAGNOSIS — G4733 Obstructive sleep apnea (adult) (pediatric): Secondary | ICD-10-CM | POA: Diagnosis present

## 2012-10-01 DIAGNOSIS — R079 Chest pain, unspecified: Secondary | ICD-10-CM | POA: Diagnosis not present

## 2012-10-01 DIAGNOSIS — I517 Cardiomegaly: Secondary | ICD-10-CM | POA: Insufficient documentation

## 2012-10-01 DIAGNOSIS — Z79899 Other long term (current) drug therapy: Secondary | ICD-10-CM | POA: Diagnosis not present

## 2012-10-01 DIAGNOSIS — R0609 Other forms of dyspnea: Secondary | ICD-10-CM | POA: Diagnosis not present

## 2012-10-01 DIAGNOSIS — R109 Unspecified abdominal pain: Secondary | ICD-10-CM | POA: Diagnosis not present

## 2012-10-01 DIAGNOSIS — M25569 Pain in unspecified knee: Secondary | ICD-10-CM | POA: Insufficient documentation

## 2012-10-01 DIAGNOSIS — E669 Obesity, unspecified: Secondary | ICD-10-CM | POA: Diagnosis not present

## 2012-10-01 DIAGNOSIS — Z853 Personal history of malignant neoplasm of breast: Secondary | ICD-10-CM | POA: Diagnosis not present

## 2012-10-01 DIAGNOSIS — R0989 Other specified symptoms and signs involving the circulatory and respiratory systems: Secondary | ICD-10-CM | POA: Insufficient documentation

## 2012-10-01 DIAGNOSIS — R1011 Right upper quadrant pain: Secondary | ICD-10-CM | POA: Diagnosis not present

## 2012-10-01 DIAGNOSIS — C50919 Malignant neoplasm of unspecified site of unspecified female breast: Secondary | ICD-10-CM | POA: Diagnosis not present

## 2012-10-01 DIAGNOSIS — I519 Heart disease, unspecified: Secondary | ICD-10-CM

## 2012-10-01 DIAGNOSIS — I10 Essential (primary) hypertension: Secondary | ICD-10-CM | POA: Diagnosis present

## 2012-10-01 DIAGNOSIS — D250 Submucous leiomyoma of uterus: Secondary | ICD-10-CM | POA: Diagnosis not present

## 2012-10-01 DIAGNOSIS — R102 Pelvic and perineal pain: Secondary | ICD-10-CM

## 2012-10-01 DIAGNOSIS — R1031 Right lower quadrant pain: Secondary | ICD-10-CM | POA: Diagnosis not present

## 2012-10-01 DIAGNOSIS — J4521 Mild intermittent asthma with (acute) exacerbation: Secondary | ICD-10-CM

## 2012-10-01 DIAGNOSIS — G8929 Other chronic pain: Secondary | ICD-10-CM | POA: Diagnosis present

## 2012-10-01 DIAGNOSIS — I5189 Other ill-defined heart diseases: Secondary | ICD-10-CM | POA: Diagnosis present

## 2012-10-01 DIAGNOSIS — N870 Mild cervical dysplasia: Secondary | ICD-10-CM

## 2012-10-01 LAB — CBC WITH DIFFERENTIAL/PLATELET
Eosinophils Absolute: 0.1 10*3/uL (ref 0.0–0.7)
Eosinophils Relative: 3 % (ref 0–5)
HCT: 42.4 % (ref 36.0–46.0)
Lymphs Abs: 1.4 10*3/uL (ref 0.7–4.0)
MCH: 26.9 pg (ref 26.0–34.0)
MCV: 82 fL (ref 78.0–100.0)
Monocytes Absolute: 0.3 10*3/uL (ref 0.1–1.0)
Platelets: 238 10*3/uL (ref 150–400)
RBC: 5.17 MIL/uL — ABNORMAL HIGH (ref 3.87–5.11)

## 2012-10-01 LAB — POCT I-STAT TROPONIN I: Troponin i, poc: 0.18 ng/mL (ref 0.00–0.08)

## 2012-10-01 LAB — COMPREHENSIVE METABOLIC PANEL
AST: 18 U/L (ref 0–37)
Albumin: 3.6 g/dL (ref 3.5–5.2)
Alkaline Phosphatase: 117 U/L (ref 39–117)
Chloride: 104 mEq/L (ref 96–112)
Creatinine, Ser: 0.67 mg/dL (ref 0.50–1.10)
Potassium: 4.4 mEq/L (ref 3.5–5.1)
Total Bilirubin: 0.8 mg/dL (ref 0.3–1.2)

## 2012-10-01 LAB — URINALYSIS, ROUTINE W REFLEX MICROSCOPIC
Bilirubin Urine: NEGATIVE
Glucose, UA: NEGATIVE mg/dL
Hgb urine dipstick: NEGATIVE
Ketones, ur: NEGATIVE mg/dL
pH: 6.5 (ref 5.0–8.0)

## 2012-10-01 LAB — TROPONIN I
Troponin I: <0.3 ng/mL (ref <0.30)
Troponin I: <0.3 ng/mL (ref <0.30)

## 2012-10-01 LAB — D-DIMER, QUANTITATIVE: D-Dimer, Quant: 2.25 ug/mL-FEU — ABNORMAL HIGH (ref 0.00–0.48)

## 2012-10-01 LAB — LIPASE, BLOOD: Lipase: 15 U/L (ref 11–59)

## 2012-10-01 MED ORDER — ENOXAPARIN SODIUM 40 MG/0.4ML ~~LOC~~ SOLN
40.0000 mg | Freq: Every day | SUBCUTANEOUS | Status: DC
  Administered 2012-10-02: 40 mg via SUBCUTANEOUS
  Filled 2012-10-01 (×2): qty 0.4

## 2012-10-01 MED ORDER — PROMETHAZINE HCL 25 MG RE SUPP: 25.0000 mg | Freq: Four times a day (QID) | RECTAL | Status: DC | PRN

## 2012-10-01 MED ORDER — ASPIRIN EC 81 MG PO TBEC
81.0000 mg | DELAYED_RELEASE_TABLET | Freq: Every day | ORAL | Status: DC
  Administered 2012-10-02: 81 mg via ORAL
  Filled 2012-10-01: qty 1

## 2012-10-01 MED ORDER — SODIUM CHLORIDE 0.9 % IV SOLN
Freq: Once | INTRAVENOUS | Status: AC
  Administered 2012-10-01: 13:00:00 via INTRAVENOUS

## 2012-10-01 MED ORDER — HYDROCODONE-ACETAMINOPHEN 5-325 MG PO TABS
1.0000 | ORAL_TABLET | ORAL | Status: DC | PRN
  Administered 2012-10-02 (×2): 1 via ORAL
  Administered 2012-10-02: 2 via ORAL
  Filled 2012-10-01: qty 2
  Filled 2012-10-01 (×2): qty 1

## 2012-10-01 MED ORDER — ALUM & MAG HYDROXIDE-SIMETH 200-200-20 MG/5ML PO SUSP: 30.0000 mL | Freq: Four times a day (QID) | ORAL | Status: DC | PRN

## 2012-10-01 MED ORDER — ACETAMINOPHEN 650 MG RE SUPP: 650.0000 mg | Freq: Four times a day (QID) | RECTAL | Status: DC | PRN

## 2012-10-01 MED ORDER — IOHEXOL 300 MG/ML  SOLN
100.0000 mL | Freq: Once | INTRAMUSCULAR | Status: AC | PRN
  Administered 2012-10-01: 100 mL via INTRAVENOUS

## 2012-10-01 MED ORDER — FLUTICASONE PROPIONATE HFA 44 MCG/ACT IN AERO
2.0000 | INHALATION_SPRAY | Freq: Two times a day (BID) | RESPIRATORY_TRACT | Status: DC
  Administered 2012-10-02 (×2): 2 via RESPIRATORY_TRACT
  Filled 2012-10-01: qty 10.6

## 2012-10-01 MED ORDER — PANTOPRAZOLE SODIUM 40 MG PO TBEC
40.0000 mg | DELAYED_RELEASE_TABLET | Freq: Every day | ORAL | Status: DC
  Administered 2012-10-02: 40 mg via ORAL
  Filled 2012-10-01: qty 1

## 2012-10-01 MED ORDER — SODIUM CHLORIDE 0.9 % IJ SOLN
3.0000 mL | Freq: Two times a day (BID) | INTRAMUSCULAR | Status: DC
  Administered 2012-10-01: 3 mL via INTRAVENOUS

## 2012-10-01 MED ORDER — ONDANSETRON HCL 4 MG PO TABS: 4.0000 mg | ORAL_TABLET | Freq: Four times a day (QID) | ORAL | Status: DC | PRN

## 2012-10-01 MED ORDER — POLYETHYLENE GLYCOL 3350 17 G PO PACK
17.0000 g | PACK | Freq: Every day | ORAL | Status: DC | PRN
  Filled 2012-10-01: qty 1

## 2012-10-01 MED ORDER — DOCUSATE SODIUM 100 MG PO CAPS
100.0000 mg | ORAL_CAPSULE | Freq: Two times a day (BID) | ORAL | Status: DC
  Administered 2012-10-02 (×2): 100 mg via ORAL
  Filled 2012-10-01 (×3): qty 1

## 2012-10-01 MED ORDER — ACETAMINOPHEN 325 MG PO TABS: 650.0000 mg | ORAL_TABLET | Freq: Four times a day (QID) | ORAL | Status: DC | PRN

## 2012-10-01 MED ORDER — IOHEXOL 300 MG/ML  SOLN
50.0000 mL | Freq: Once | INTRAMUSCULAR | Status: AC | PRN
  Administered 2012-10-01: 50 mL via ORAL

## 2012-10-01 MED ORDER — AMLODIPINE BESYLATE 10 MG PO TABS: 10.0000 mg | ORAL_TABLET | Freq: Every day | ORAL | Status: DC

## 2012-10-01 MED ORDER — OXYCODONE-ACETAMINOPHEN 5-325 MG PO TABS: 1.0000 | ORAL_TABLET | Freq: Four times a day (QID) | ORAL | Status: DC | PRN

## 2012-10-01 MED ORDER — MONTELUKAST SODIUM 10 MG PO TABS
10.0000 mg | ORAL_TABLET | Freq: Every day | ORAL | Status: DC
  Administered 2012-10-02: 10 mg via ORAL
  Filled 2012-10-01 (×2): qty 1

## 2012-10-01 MED ORDER — PROMETHAZINE HCL 25 MG PO TABS: 25.0000 mg | ORAL_TABLET | Freq: Four times a day (QID) | ORAL | Status: DC | PRN

## 2012-10-01 MED ORDER — TRAZODONE HCL 50 MG PO TABS
50.0000 mg | ORAL_TABLET | Freq: Every evening | ORAL | Status: DC | PRN
  Filled 2012-10-01: qty 1

## 2012-10-01 MED ORDER — ASPIRIN 81 MG PO CHEW
324.0000 mg | CHEWABLE_TABLET | Freq: Once | ORAL | Status: AC
  Administered 2012-10-01: 324 mg via ORAL
  Filled 2012-10-01: qty 4

## 2012-10-01 MED ORDER — ALBUTEROL SULFATE HFA 108 (90 BASE) MCG/ACT IN AERS
2.0000 | INHALATION_SPRAY | Freq: Four times a day (QID) | RESPIRATORY_TRACT | Status: DC | PRN
  Administered 2012-10-02: 2 via RESPIRATORY_TRACT
  Filled 2012-10-01: qty 6.7

## 2012-10-01 MED ORDER — ONDANSETRON HCL 4 MG/2ML IJ SOLN: 4.0000 mg | Freq: Four times a day (QID) | INTRAMUSCULAR | Status: DC | PRN

## 2012-10-01 MED ORDER — ANASTROZOLE 1 MG PO TABS
1.0000 mg | ORAL_TABLET | Freq: Every day | ORAL | Status: DC
  Administered 2012-10-02: 1 mg via ORAL
  Filled 2012-10-01: qty 1

## 2012-10-01 NOTE — ED Notes (Signed)
Made Dr Radford Pax aware of abnormal troponin level.

## 2012-10-01 NOTE — ED Notes (Signed)
Per EMS pt coming from home with c/o upper abdominal pain that radiates to her back and groin. Per EMS pt stated that she is supposed to be taking some medications but unsure which and is not taking any.

## 2012-10-01 NOTE — H&P (Signed)
[facility] History and Physical  Michaela Little ZOX:[id number] DOB: 08-Nov-1960 DOA: 10/01/2012  Referring physician: Radford Pax PCP: Oneal Grout, MD    Chief Complaint:  "my whole right side hurts"  HPI: Michaela Little is a 52 y.o. female presents to the emergency room with multiple complaints. She reports that her entire right side hurts. She woke up at about 2 AM this morning with sharp right chest pain that radiated to her right shoulder. It only lasts a few minutes. She also has chronic abdominal pain that has been worked up extensively in the past. She is also complaining of right leg and knee pain "arthritis". She reports dyspnea on exertion. Some chest pressure. She has had a cholecystectomy, endoscopy, multiple scans for her abdominal pain. She reports having had a negative stress test a few years ago in [place]. She is also seen [facility] in the office for chest pain. No history of cardiac catheterization. Initial troponin was negative. EKG was done concerning. Repeat point-of-care troponin was elevated at 0.18. ED physician spoke with Dr. Daleen Squibb who recommended observation overnight and serial enzymes and EKGs. She had a CAT scan of the abdomen today which showed nothing acute. She has a history of breast cancer status post mastectomy, maintained on a room fracture. No history of blood clot. She reports that her right leg seems a bit more swollen than usual but localizes it mainly to the knee. The pain extends up to the groin however.  Review of Systems: Systems reviewed and as above otherwise negative.  Past Medical History  Diagnosis Date  . Uterine fibroid   . Hyperlipidemia   . Hypertension   . Asthma   . SVD (spontaneous vaginal delivery)     x 5  . Seasonal allergies   . Arthritis     hands, knees  . History of pneumonia   . Anginal pain     BEEN GOING ON FOR COUPLE MO  . Anxiety   . Shortness of breath     occasional WITH EXERTIO N  . OSA (obstructive  sleep apnea)     CPAP  , NOT USING ON REGULAR BASIS  . Tuberculosis     AS CHILD MED TREATMENT   . Heart murmur     AS CHILD   . Headache(784.0)     otc med prn  . Breast cancer 11/2008    left  . Pneumonia     hx  . Dysuria   . Spasm of muscle   . Unspecified vitamin D deficiency   . Other abnormal blood chemistry   . Dysplasia of cervix, unspecified    Past Surgical History  Procedure Laterality Date  . Eye surgery  1961    right  . Cervical conization w/bx      done in [place]  . Colonoscopy  2011    normal per patient - [place]  . Dilation and curettage of uterus  10/15/2011    Procedure: DILATATION AND CURETTAGE;  Surgeon: Bing Plume, MD;  Location: [facility];  Service: Gynecology;  Laterality: N/A;  Conization  . Cholecystectomy N/A 05/14/2012    Procedure: LAPAROSCOPIC CHOLECYSTECTOMY;  Surgeon: Axel Filler, MD;  Location: [facility] OR;  Service: General;  Laterality: N/A;  . Breast surgery  11/2008     done in [place] - breast cancer  . Mastectomy  11/2008    done in [place], left   Social History:  reports that she quit smoking about 24 years ago. Her  smoking use included Cigarettes. She has a 3 pack-year smoking history. She has never used smokeless tobacco. She reports that she does not drink alcohol or use illicit drugs.   Allergies  Allergen Reactions  . Calcium Carbonate Antacid Hives    Fruit flavored Tums   . Hctz (Hydrochlorothiazide) Itching  . Lisinopril Itching    Family History  Problem Relation Age of Onset  . Breast cancer Mother   . Colon cancer Mother   . Hypotension Mother   . Asthma Mother   . Diabetes type II Mother   . Arthritis Mother   . Clotting disorder Mother   . Cancer Mother     breast/colon  . Mental illness Brother   . Heart disease Brother   . Cerebral palsy Daughter   . Emphysema Brother     never smoker  . Colon cancer Maternal Aunt   . Cancer Maternal Aunt     colon  . Colon cancer Maternal Uncle   . Cancer Maternal Uncle     colon    Prior to Admission medications   Medication Sig Start Date End Date Taking? Authorizing Provider  albuterol (PROVENTIL HFA;VENTOLIN HFA) 108 (90 BASE) MCG/ACT inhaler Inhale 2 puffs into the lungs every 6 (six) hours as needed. For wheezing   Yes Historical Provider, MD  amLODipine (NORVASC) 10 MG tablet Take 10 mg by mouth daily.   Yes Historical Provider, MD  anastrozole (ARIMIDEX) 1 MG tablet Take 1 mg by mouth daily.  03/27/12  Yes Historical Provider, MD  fluticasone (FLOVENT HFA) 44 MCG/ACT inhaler Inhale 2 puffs into the lungs 2 (two) times daily as needed. For asthma   Yes Historical Provider, MD  furosemide (LASIX) 40 MG tablet Take 40 mg by mouth daily as needed. For leg swelling   Yes Historical Provider, MD  loratadine (CLARITIN) 10 MG tablet Take 10 mg by mouth daily as needed for allergies.    Yes Historical Provider, MD  montelukast (SINGULAIR) 10 MG tablet Take 10 mg by mouth at bedtime.   Yes Historical Provider, MD  omeprazole (PRILOSEC) 20 MG capsule Take 20 mg by mouth at bedtime.   Yes Historical Provider, MD  polyethylene glycol (MIRALAX / GLYCOLAX) packet Take 17 g by mouth daily as needed. For constipation 02/26/12  Yes Archie Patten, CNM  oxyCODONE-acetaminophen (PERCOCET/ROXICET) 5-325 MG per tablet Take 1 tablet by mouth every 6 (six) hours as needed for pain. 10/01/12   Derwood Kaplan, MD  promethazine (PHENERGAN) 25 MG suppository Place 1 suppository (25 mg total) rectally every 6 (six) hours as needed for nausea. 10/01/12   Derwood Kaplan, MD  promethazine (PHENERGAN) 25 MG tablet Take 1 tablet (25 mg total) by mouth every 6 (six) hours as needed for nausea. 10/01/12   Derwood Kaplan, MD   Physical Exam: Filed Vitals:   10/01/12 1053 10/01/12 1315 10/01/12 1850 10/01/12 1949  BP: 183/89 180/84 176/89 162/97  Pulse: 108 98 101 87  Temp: 98.6 F (37 C)   98.7 F (37.1 C)  TempSrc:    Oral  Resp: 22 18 18 16   SpO2: 99% 97% 95% 97%   BP 187/96  Pulse 54   Temp(Src) 97.9 F (36.6 C) (Oral)  Resp 18  Ht 5\' 7"  (1.702 m)  Wt 118 kg (260 lb 2.3 oz)  BMI 40.73 kg/m2  SpO2 100%  LMP 02/25/2009  General Appearance:    obese black female comfortable at rest but appears somewhat uncomfortable when walking. Oriented to  Head:    Normocephalic, without obvious abnormality, atraumatic  Eyes:    PERRL, conjunctiva/corneas clear, EOM's intact, fundi    benign, both eyes  Ears:    Normal TM's and external ear canals, both ears  Nose:   Nares normal, septum midline, mucosa normal, no drainage    or sinus tenderness  Throat:   Lips, mucosa, and tongue normal; teeth and gums normal  Neck:   Supple, symmetrical, trachea midline, no adenopathy;    thyroid:  no enlargement/tenderness/nodules; no carotid   bruit or JVD  Back:     Symmetric, no curvature, ROM normal, no CVA tenderness  Lungs:     Clear to auscultation bilaterally, respirations unlabored  Chest Wall:    some chest wall tenderness with palpation    Heart:    Regular rate and rhythm, S1 and S2 normal, no murmur, rub   or gallop     Abdomen:     obese, soft, nontender.   Genitalia:   deferred   Rectal:   deferred  Extremities:  trace edema bilaterally. No calf tenderness. Right knee with joint effusion. No tenderness. Full range of motion   Pulses:   2+ and symmetric all extremities  Skin:   Skin color, texture, turgor normal, no rashes or lesions  Lymph nodes:   Cervical, supraclavicular, and axillary nodes normal  Neurologic:   CNII-XII intact, normal strength, sensation and reflexes    throughout    psychiatric: Normal affect   Labs on Admission:  Basic Metabolic Panel:  Recent Labs Lab 10/01/12 1240  NA 138  K 4.4  CL 104  CO2 24  GLUCOSE 94  BUN 8  CREATININE 0.67  CALCIUM 9.3   Liver Function Tests:  Recent Labs Lab 10/01/12 1240  AST 18  ALT 10  ALKPHOS 117  BILITOT 0.8  PROT 7.4  ALBUMIN 3.6    Recent Labs Lab 10/01/12 1240  LIPASE 15   No results  found for this basename: AMMONIA,  in the last 168 hours CBC:  Recent Labs Lab 10/01/12 1240  WBC 4.0  NEUTROABS 2.2  HGB 13.9  HCT 42.4  MCV 82.0  PLT 238   Cardiac Enzymes:  Recent Labs Lab 10/01/12 1545  TROPONINI <0.30    BNP (last 3 results) No results found for this basename: PROBNP,  in the last 8760 hours CBG: No results found for this basename: GLUCAP,  in the last 168 hours  Radiological Exams on Admission: Ct Abdomen Pelvis W Contrast  10/01/2012   *RADIOLOGY REPORT*  Clinical Data: Right lower quadrant pain.  Right flank pain. Breast carcinoma.  CT ABDOMEN AND PELVIS WITH CONTRAST  Technique:  Multidetector CT imaging of the abdomen and pelvis was performed following the standard protocol during bolus administration of intravenous contrast.  Contrast:  100 ml Omnipaque-300 and oral contrast  Comparison: 01/16/2012  Findings: Surgical clips again seen from prior cholecystectomy. Prominence of the bile ducts is stable.  No liver masses are identified.  The spleen is normal in size in appearance.  The pancreas, adrenal glands, and kidneys are also normal in appearance.  No evidence of hydronephrosis.  A few small uterine fibroids are again seen, largest in the fundal region measuring 3.5 cm.  Adnexal regions are unremarkable.  No other soft tissue masses or lymphadenopathy identified.  The appendix is normal appearance.  No evidence of inflammatory process or abnormal fluid collections.  No evidence of bowel wall thickening, dilatation, or hernia.  IMPRESSION:  1.  No acute findings. 2.  Small uterine fibroids, largest measuring 3.5 cm.   Original Report Authenticated By: Myles Rosenthal, M.D.   Dg Chest Port 1 View  10/01/2012   *RADIOLOGY REPORT*  Clinical Data: Chest pain  PORTABLE CHEST - 1 VIEW  Comparison: Prior chest x-ray 06/19/2012  Findings: Low lung volumes with bibasilar atelectasis versus infiltrate.  Atelectasis is favored.  Mild cardiomegaly is similar to prior given  low lung volumes and portable frontal technique.  No large effusion, pneumothorax or pulmonary edema.  No acute osseous abnormality.  IMPRESSION:  1.  Low lung volumes with bibasilar atelectasis. 2.  Stable borderline cardiomegaly.   Original Report Authenticated By: Malachy Moan, M.D.    EKG:  Normal Sinus rhythm  Assessment/Plan  Atypical chest pain: initial troponin normal. Repeat POC slightly elevated. Will place on observation, repeat troponin and EKG. With h/o breast cancer, and leg pain, will get D dimer. If elevated, need to r/o PE.   Asthma   OSA (obstructive sleep apnea), noncompliant with CPAP at home. Will try here   Hypertension   History of breast cancer   Obesity   Diastolic dysfunction, takes lasix PRN   Hyperlipidemia   Chronic abdominal pain: no further inpatient workup needed  Code Status: full Family Communication: none at bedside Disposition Plan: home  Time spent: 50 minutes  Darlisa Spruiell L [facility] Pager [phone]  If 7PM-7AM, please contact night-coverage [email or web address] Password [facility] 10/01/2012, 8:47 PM

## 2012-10-01 NOTE — ED Notes (Signed)
Pt presents with myriad of symptoms, stating she is having chest pain and shortness of breath, as well as uterine fibroids; pt reports upper and lower abdominal pain, reports productive cough x 1 day. Pt on cardiac monitor, PIV inserted, will continue to monitor.

## 2012-10-01 NOTE — ED Notes (Signed)
Pt reports upper abdominal pain, sts "this is not a chest pain, I know what that is"

## 2012-10-01 NOTE — ED Notes (Signed)
Called to give report nurse unavailable will call back.  

## 2012-10-01 NOTE — ED Provider Notes (Signed)
History    CSN: [id number] Arrival date & time 10/01/12  1045  First MD Initiated Contact with Patient 10/01/12 1049     Chief Complaint  Patient presents with  . Abdominal Pain   (Consider location/radiation/quality/duration/timing/severity/associated sxs/prior Treatment) HPI Comments: Pt comes in with cc of abd pain, chest pain.  Pt has hx of angina, chronic abd pain, s/p cholecystectomy, being evaluated by Gyne - already cleared by GI. Also has hx of HTN, HL, negative Cardiac workup in [place] 2012/2013. Pt states that she has chronic abd pain, worse x 2 days. The pain is located in the RLQ and RUQ. She has some nausea, no diarrhea, emesis, fevers. Pt admits to increased frequency in urination with no dysuria or hematuria, and no vaginal discharge. Pt also complains of chest pain this morning, which was midsternal radiates to the right arm and was associated with palpitations and dizziness. She has no hx of PE, DVT. She is symptom free at this time.  The history is provided by the patient and medical records.   Past Medical History  Diagnosis Date  . Uterine fibroid   . Hyperlipidemia   . Hypertension   . Asthma   . SVD (spontaneous vaginal delivery)     x 5  . Seasonal allergies   . Arthritis     hands, knees  . History of pneumonia   . Anginal pain     BEEN GOING ON FOR COUPLE MO  . Anxiety   . Shortness of breath     occasional WITH EXERTIO N  . OSA (obstructive sleep apnea)     CPAP  , NOT USING ON REGULAR BASIS  . Tuberculosis     AS CHILD MED TREATMENT   . Heart murmur     AS CHILD   . Headache(784.0)     otc med prn  . Breast cancer 11/2008    left  . Pneumonia     hx  . Dysuria   . Spasm of muscle   . Unspecified vitamin D deficiency   . Other abnormal blood chemistry   . Dysplasia of cervix, unspecified    Past Surgical History  Procedure Laterality Date  . Eye surgery  1961    right  . Cervical conization w/bx      done in [place]  . Colonoscopy  2011    normal per patient - [place]  . Dilation and curettage of uterus  10/15/2011    Procedure: DILATATION AND CURETTAGE;  Surgeon: Bing Plume, MD;  Location: [facility] ORS;  Service: Gynecology;  Laterality: N/A;  Conization  . Cholecystectomy N/A 05/14/2012    Procedure: LAPAROSCOPIC CHOLECYSTECTOMY;  Surgeon: Axel Filler, MD;  Location: [facility] OR;  Service: General;  Laterality: N/A;  . Breast surgery  11/2008     done in [place] - breast cancer  . Mastectomy  11/2008    done in [place], left   Family History  Problem Relation Age of Onset  . Breast cancer Mother   . Colon cancer Mother   . Hypotension Mother   . Asthma Mother   . Diabetes type II Mother   . Arthritis Mother   . Clotting disorder Mother   . Cancer Mother     breast/colon  . Mental illness Brother   . Heart disease Brother   . Cerebral palsy Daughter   . Emphysema Brother     never smoker  . Colon cancer Maternal Aunt   . Cancer Maternal Aunt  colon  . Colon cancer Maternal Uncle   . Cancer Maternal Uncle     colon   History  Substance Use Topics  . Smoking status: Former Smoker -- 0.30 packs/day for 10 years    Types: Cigarettes    Quit date: 03/26/1988  . Smokeless tobacco: Never Used  . Alcohol Use: No   OB History   Grav Para Term Preterm Abortions TAB SAB Ect Mult Living   10 5 5  5 2 3   5      Review of Systems  Constitutional: Negative for activity change.  HENT: Negative for facial swelling and neck pain.   Respiratory: Positive for chest tightness and shortness of breath. Negative for cough and wheezing.   Cardiovascular: Positive for chest pain and palpitations.  Gastrointestinal: Positive for nausea and abdominal pain. Negative for vomiting, diarrhea, constipation, blood in stool and abdominal distention.  Genitourinary: Negative for hematuria and difficulty urinating.  Skin: Negative for color change.  Neurological: Positive for dizziness. Negative for speech difficulty.  Hematological: Does not  bruise/bleed easily.  Psychiatric/Behavioral: Negative for confusion.    Allergies  Calcium carbonate antacid; Hctz; and Lisinopril  Home Medications   Current Outpatient Rx  Name  Route  Sig  Dispense  Refill  . albuterol (PROVENTIL HFA;VENTOLIN HFA) 108 (90 BASE) MCG/ACT inhaler   Inhalation   Inhale 2 puffs into the lungs every 6 (six) hours as needed. For wheezing         . amLODipine (NORVASC) 10 MG tablet   Oral   Take 10 mg by mouth daily.         Marland Kitchen anastrozole (ARIMIDEX) 1 MG tablet   Oral   Take 1 mg by mouth daily.          . fluticasone (FLOVENT HFA) 44 MCG/ACT inhaler   Inhalation   Inhale 2 puffs into the lungs 2 (two) times daily as needed. For asthma         . furosemide (LASIX) 40 MG tablet   Oral   Take 40 mg by mouth daily as needed. For leg swelling         . loratadine (CLARITIN) 10 MG tablet   Oral   Take 10 mg by mouth daily as needed for allergies.          . montelukast (SINGULAIR) 10 MG tablet   Oral   Take 10 mg by mouth at bedtime.         Marland Kitchen omeprazole (PRILOSEC) 20 MG capsule   Oral   Take 20 mg by mouth at bedtime.         . polyethylene glycol (MIRALAX / GLYCOLAX) packet   Oral   Take 17 g by mouth daily as needed. For constipation          BP 183/89  Pulse 108  Temp(Src) 98.6 F (37 C)  Resp 22  SpO2 99%  LMP 02/25/2009 Physical Exam  Nursing note and vitals reviewed. Constitutional: She is oriented to person, place, and time. She appears well-developed and well-nourished.  HENT:  Head: Normocephalic and atraumatic.  Eyes: EOM are normal. Pupils are equal, round, and reactive to light.  Neck: Neck supple. No JVD present.  Cardiovascular: Normal rate, regular rhythm and normal heart sounds.   No murmur heard. Pulmonary/Chest: Effort normal. No respiratory distress.  Abdominal: Soft. She exhibits no distension. There is tenderness. There is no rebound and no guarding.  RUQ and RLQ tenderness, guarding in  the RLQ,  no rebound tenderness.  Neurological: She is alert and oriented to person, place, and time.  Skin: Skin is warm and dry.    ED Course  Procedures (including critical care time) Labs Reviewed  COMPREHENSIVE METABOLIC PANEL  CBC WITH DIFFERENTIAL  LIPASE, BLOOD  URINALYSIS, ROUTINE W REFLEX MICROSCOPIC   No results found. No diagnosis found.  MDM   Date: 10/01/2012  Rate: 74  Rhythm: normal sinus rhythm  QRS Axis: normal  Intervals: normal  ST/T Wave abnormalities: normal  Conduction Disutrbances: none  Narrative Interpretation: unremarkable  Pt comes in with cc of abd pain, chest pain.  ABD PAIN: Chronic complain. Has had a full GI workup, including upper endoscopy - and has no ulcers. S/p cholecystectomy. Has known fibroids, and being evaluated by [facility] for lower abd pain. She had a Ct done earlier in the year- no acute findings. Does have some guarding in the RLQ, will get CT with oral contrast.  Chest pain: Seen by [facility] before. Atypical, non specific pain and is symptom free right now. CAD risk factors include age, htn, 48. Cards saw her in 2013 - and plan was to get prn NPI. Will call [facility] and set up an appt if the results today ate normal.    Derwood Kaplan, MD 10/01/12 1317

## 2012-10-01 NOTE — ED Notes (Signed)
During hourly rounding pt asked how much longer before she gets admitted; pt sts "I have something wrong with me, I'm dying, you have to keep me". Explained to pt that EDP will come in and discuss with her test results. Pt voiced understanding, however stated again "I'm dying".

## 2012-10-01 NOTE — ED Notes (Signed)
Pt refused to have vitals checked because she want to get admitted. Nurse aware

## 2012-10-01 NOTE — Progress Notes (Signed)
Pt states she does not have a computer and does not want to sign up for My Chart. Briscoe Burns BSN, RN-BC Admissions RN  10/01/2012 8:12 PM

## 2012-10-02 ENCOUNTER — Encounter (HOSPITAL_COMMUNITY): Payer: Self-pay

## 2012-10-02 DIAGNOSIS — J45901 Unspecified asthma with (acute) exacerbation: Secondary | ICD-10-CM

## 2012-10-02 DIAGNOSIS — G4733 Obstructive sleep apnea (adult) (pediatric): Secondary | ICD-10-CM | POA: Diagnosis not present

## 2012-10-02 DIAGNOSIS — R0789 Other chest pain: Secondary | ICD-10-CM | POA: Diagnosis not present

## 2012-10-02 DIAGNOSIS — R079 Chest pain, unspecified: Secondary | ICD-10-CM | POA: Diagnosis not present

## 2012-10-02 DIAGNOSIS — R109 Unspecified abdominal pain: Secondary | ICD-10-CM | POA: Diagnosis not present

## 2012-10-02 DIAGNOSIS — J45909 Unspecified asthma, uncomplicated: Secondary | ICD-10-CM | POA: Diagnosis not present

## 2012-10-02 DIAGNOSIS — R0602 Shortness of breath: Secondary | ICD-10-CM | POA: Diagnosis not present

## 2012-10-02 DIAGNOSIS — I10 Essential (primary) hypertension: Secondary | ICD-10-CM | POA: Diagnosis not present

## 2012-10-02 DIAGNOSIS — G8929 Other chronic pain: Secondary | ICD-10-CM | POA: Diagnosis not present

## 2012-10-02 MED ORDER — PROMETHAZINE HCL 25 MG PO TABS: 25.0000 mg | ORAL_TABLET | Freq: Four times a day (QID) | ORAL | Status: DC | PRN

## 2012-10-02 MED ORDER — AMLODIPINE BESYLATE 10 MG PO TABS
10.0000 mg | ORAL_TABLET | Freq: Every day | ORAL | Status: DC
  Administered 2012-10-02: 10 mg via ORAL
  Filled 2012-10-02: qty 1

## 2012-10-02 MED ORDER — IOHEXOL 350 MG/ML SOLN: 100.0000 mL | Freq: Once | INTRAVENOUS | Status: DC | PRN

## 2012-10-02 MED ORDER — HYDRALAZINE HCL 20 MG/ML IJ SOLN
10.0000 mg | Freq: Once | INTRAMUSCULAR | Status: AC
  Administered 2012-10-02: 10 mg via INTRAVENOUS
  Filled 2012-10-02: qty 1

## 2012-10-02 MED ORDER — OXYCODONE-ACETAMINOPHEN 5-325 MG PO TABS: 1.0000 | ORAL_TABLET | Freq: Four times a day (QID) | ORAL | Status: DC | PRN

## 2012-10-02 MED ORDER — PROMETHAZINE HCL 25 MG RE SUPP: 25.0000 mg | Freq: Four times a day (QID) | RECTAL | Status: DC | PRN

## 2012-10-02 NOTE — Progress Notes (Signed)
Pt placed on auto CPAP via nasal mask with 2 LPM O2 bleed in, pt tolerating well at this time, RT to monitor and assess as needed.

## 2012-10-02 NOTE — Discharge Summary (Signed)
Physician Discharge Summary  Michaela Little NWG:[id number] DOB: [birth year] DOA: 10/01/2012  PCP: Oneal Grout, MD  Admit date: 10/01/2012 Discharge date: 10/02/2012  Recommendations for Outpatient Follow-up:  1. Pt will need to follow up with PCP in 2-3 weeks post discharge 2. Please obtain BMP to evaluate electrolytes and kidney function 3. Please also check CBC to evaluate Hg and Hct levels 4. Pt was advised to schedule follow up appointment with cardiologist as soon as possible to determine if additional testing such as stress test is indicated 5. Please note that acute coronary syndrome was ruled out 6. Please also note that pt has been concerned with shortness of breath which is chronic and she has seen pulmonologist Dr. Shelle Iron in the past, I have advised her to call his office for follow up appointment as well   Discharge Diagnoses: Atypical chest pain  Active Problems:   Asthma   OSA (obstructive sleep apnea)   Hypertension   History of breast cancer   Obesity   Diastolic dysfunction   Atypical chest pain   Hyperlipidemia   Chronic abdominal pain   Discharge Condition: Stable  Diet recommendation: Heart healthy diet discussed in details   History of present illness:  52 y.o. female presents to the emergency room with multiple complaints. She reports that her entire right side hurts. She woke up at about 2 AM this morning with sharp right chest pain that radiated to her right shoulder. It only lasts a few minutes. She also has chronic abdominal pain that has been worked up extensively in the past. She is also complaining of right leg and knee pain "arthritis". She reports dyspnea on exertion. Some chest pressure. She has had a cholecystectomy, endoscopy, multiple scans for her abdominal pain. She reports having had a negative stress test a few years ago in [place]. She is also seen [facility] in the office for chest pain. No history of cardiac catheterization. Initial  troponin was negative. EKG was done concerning. Repeat point-of-care troponin was elevated at 0.18. ED physician spoke with Dr. Daleen Squibb who recommended observation overnight and serial enzymes and EKGs. She had a CAT scan of the abdomen which showed nothing acute. She has a history of breast cancer status post mastectomy, maintained on a room fracture. No history of blood clot.   Hospital Course:  Active Problems:   Atypical chest pain - initial POC troponin slightly elevated but subsequent 3 sets all within normal limits - pt denies chest pain at this time but has shortness of breath with exertion which she explains is chronic - no acute cardiopulmonary findings on CXR, no events on telemetry - ACS ruled out, pt advised to follow up with cardiologist as soon as possible    Asthma - appears to be clinically stable but pt's shortness of breath can be secondary to asthma as well - she is maintaining oxygen saturation at target range and no wheezing on exam noted - pt has decreased breath sounds at bases but I suspect this to be related to body habitus itself rather than other pulmonary condition    OSA (obstructive sleep apnea) - noncompliance with CPAP - we have discussed compliance in detail   Hypertension - reasonable inpatient control    History of breast cancer - stable    Obesity with BMI > 40  - nutrition consultation provided    Diastolic dysfunction - clinically compensated, good urine output - continue Lasix per home medical regimen   Procedures/Studies: Ct Angio Chest  Pe W/cm &/or Wo Cm  10/02/2012   *RADIOLOGY REPORT*  Clinical Data: Chest pain, shortness of breath.  CT ANGIOGRAPHY CHEST  Technique:  Multidetector CT imaging of the chest using the standard protocol during bolus administration of intravenous contrast. Multiplanar reconstructed images including MIPs were obtained and reviewed to evaluate the vascular anatomy.  Contrast:  100 ml Omnipaque 300 IV.  Comparison: Chest  x-ray 10/01/2012  Findings: Heart is normal size.  Aorta is normal caliber. No filling defects in the pulmonary arteries to suggest pulmonary emboli.  No confluent airspace opacities or effusions. No mediastinal, hilar, or axillary adenopathy.  Left breast implant noted.  Chest wall soft tissues otherwise unremarkable.  Prior left mastectomy and lymph node dissection.  Imaging into the upper abdomen shows no acute findings.  IMPRESSION: No evidence of pulmonary embolus.  No acute findings.   Original Report Authenticated By: Charlett Nose, M.D.   Ct Abdomen Pelvis W Contrast  10/01/2012   *RADIOLOGY REPORT*  Clinical Data: Right lower quadrant pain.  Right flank pain. Breast carcinoma.  CT ABDOMEN AND PELVIS WITH CONTRAST  Technique:  Multidetector CT imaging of the abdomen and pelvis was performed following the standard protocol during bolus administration of intravenous contrast.  Contrast:  100 ml Omnipaque-300 and oral contrast  Comparison: 01/16/2012  Findings: Surgical clips again seen from prior cholecystectomy. Prominence of the bile ducts is stable.  No liver masses are identified.  The spleen is normal in size in appearance.  The pancreas, adrenal glands, and kidneys are also normal in appearance.  No evidence of hydronephrosis.  A few small uterine fibroids are again seen, largest in the fundal region measuring 3.5 cm.  Adnexal regions are unremarkable.  No other soft tissue masses or lymphadenopathy identified.  The appendix is normal appearance.  No evidence of inflammatory process or abnormal fluid collections.  No evidence of bowel wall thickening, dilatation, or hernia.  IMPRESSION:  1.  No acute findings. 2.  Small uterine fibroids, largest measuring 3.5 cm.   Original Report Authenticated By: Myles Rosenthal, M.D.   Dg Chest Port 1 View  10/01/2012   *RADIOLOGY REPORT*  Clinical Data: Chest pain  PORTABLE CHEST - 1 VIEW  Comparison: Prior chest x-ray 06/19/2012  Findings: Low lung volumes with  bibasilar atelectasis versus infiltrate.  Atelectasis is favored.  Mild cardiomegaly is similar to prior given low lung volumes and portable frontal technique.  No large effusion, pneumothorax or pulmonary edema.  No acute osseous abnormality.  IMPRESSION:  1.  Low lung volumes with bibasilar atelectasis. 2.  Stable borderline cardiomegaly.   Original Report Authenticated By: Malachy Moan, M.D.   Consultations:  None  Antibiotics:  None  Discharge Exam: Filed Vitals:   10/02/12 1424  BP: 158/89  Pulse:   Temp:   Resp:    Filed Vitals:   10/02/12 0555 10/02/12 0633 10/02/12 1045 10/02/12 1424  BP: 186/107 138/73  158/89  Pulse:  63    Temp:      TempSrc:      Resp:      Height:      Weight:      SpO2:   97%     General: Pt is alert, follows commands appropriately, not in acute distress Cardiovascular: Regular rate and rhythm, S1/S2 +, no murmurs, no rubs, no gallops Respiratory: Clear to auscultation bilaterally, no wheezing, decreased breath sounds at  Bases  Abdominal: Soft, non tender, non distended, bowel sounds +, no guarding Extremities: no edema, no  cyanosis, pulses palpable bilaterally DP and PT Neuro: Grossly nonfocal  Discharge Instructions  Discharge Orders   Future Appointments Provider Department Dept Phone   01/06/2013 11:30 AM Dava Najjar [facility] [phone]   01/06/2013 12:00 PM Lowella Dell, MD [facility] MEDICAL ONCOLOGY [phone]   Future Orders Complete By Expires     Diet - low sodium heart healthy  As directed     Increase activity slowly  As directed         Medication List         albuterol 108 (90 BASE) MCG/ACT inhaler  Commonly known as:  PROVENTIL HFA;VENTOLIN HFA  Inhale 2 puffs into the lungs every 6 (six) hours as needed. For wheezing     amLODipine 10 MG tablet  Commonly known as:  NORVASC  Take 10 mg by mouth daily.     anastrozole 1 MG tablet  Commonly known  as:  ARIMIDEX  Take 1 mg by mouth daily.     fluticasone 44 MCG/ACT inhaler  Commonly known as:  FLOVENT HFA  Inhale 2 puffs into the lungs 2 (two) times daily as needed. For asthma     furosemide 40 MG tablet  Commonly known as:  LASIX  Take 40 mg by mouth daily as needed. For leg swelling     loratadine 10 MG tablet  Commonly known as:  CLARITIN  Take 10 mg by mouth daily as needed for allergies.     omeprazole 20 MG capsule  Commonly known as:  PRILOSEC  Take 20 mg by mouth at bedtime.     oxyCODONE-acetaminophen 5-325 MG per tablet  Commonly known as:  PERCOCET/ROXICET  Take 1 tablet by mouth every 6 (six) hours as needed for pain.     polyethylene glycol packet  Commonly known as:  MIRALAX / GLYCOLAX  Take 17 g by mouth daily as needed. For constipation     promethazine 25 MG suppository  Commonly known as:  PHENERGAN  Place 1 suppository (25 mg total) rectally every 6 (six) hours as needed for nausea.     promethazine 25 MG tablet  Commonly known as:  PHENERGAN  Take 1 tablet (25 mg total) by mouth every 6 (six) hours as needed for nausea.     SINGULAIR 10 MG tablet  Generic drug:  montelukast  Take 10 mg by mouth at bedtime.           Follow-up Information   Follow up with [facility]). Schedule an appointment as soon as possible for a visit in 1 week.   Contact information:   [address] [place] [address] [phone]      Follow up with Oneal Grout, MD. Schedule an appointment as soon as possible for a visit in 3 days.   Contact information:   [address] [place] [address] [phone]        The results of significant diagnostics from this hospitalization (including imaging, microbiology, ancillary and laboratory) are listed below for reference.     Microbiology: No results found for this or any previous visit (from the past 240 hour(s)).   Labs: Basic Metabolic Panel:  Recent  Labs Lab 10/01/12 1240  NA 138  K 4.4  CL 104  CO2 24  GLUCOSE 94  BUN 8  CREATININE 0.67  CALCIUM 9.3   Liver Function Tests:  Recent Labs Lab 10/01/12 1240  AST 18  ALT 10  ALKPHOS 117  BILITOT 0.8  PROT 7.4  ALBUMIN 3.6    Recent Labs Lab 10/01/12 1240  LIPASE 15   CBC:  Recent Labs Lab 10/01/12 1240  WBC 4.0  NEUTROABS 2.2  HGB 13.9  HCT 42.4  MCV 82.0  PLT 238   Cardiac Enzymes:  Recent Labs Lab 10/01/12 1545 10/01/12 2120 10/02/12 0245  TROPONINI <0.30 <0.30 <0.30   SIGNED: Time coordinating discharge: Over 30 minutes  Debbora Presto, MD  [facility] 10/02/2012, 2:45 PM Pager [phone]  If 7PM-7AM, please contact night-coverage [email or web address] Password [email or web address]

## 2012-10-02 NOTE — Progress Notes (Signed)
Nutrition Brief Note  Patient identified on the Malnutrition Screening Tool (MST) Report  Body mass index is 40.73 kg/(m^2). Patient meets criteria for class III extreme/morbidy obesity based on current BMI.   Current diet order is heart healthy, patient is consuming approximately 100% of meals at this time per pt report. Labs and medications reviewed.   Pt reports eating a well balanced healthy diet PTA, 2 meals/day. Pt reports in the past 1.5 years she has gone from weighing 280 pounds to 260 pounds, however past weight trend shows pt's weight has been stable for the past 7 months. Pt reports this weight loss was unintentional and attributes this to having a lot of vomiting after her gallbladder surgery earlier this year. Pt still c/o abdominal pain. Pt reports eating all of her breakfast. Pt states she has gotten the feeling like food is stuck in her esophagus, but this happens very rarely.   Wt Readings from Last 10 Encounters:  10/01/12 260 lb 2.3 oz (118 kg)  09/02/12 260 lb 9.6 oz (118.207 kg)  07/07/12 257 lb 11.2 oz (116.892 kg)  05/29/12 254 lb (115.214 kg)  05/07/12 254 lb 6.4 oz (115.395 kg)  04/16/12 255 lb 14.4 oz (116.075 kg)  04/09/12 260 lb 9.6 oz (118.207 kg)  03/13/12 262 lb 1.6 oz (118.888 kg)  03/11/12 266 lb (120.657 kg)  03/10/12 266 lb 6.4 oz (120.838 kg)    No nutrition interventions warranted at this time. If nutrition issues arise, please consult RD.    Levon Hedger MS, RD, LDN [phone] Pager [phone] After Hours Pager

## 2012-10-03 ENCOUNTER — Emergency Department (HOSPITAL_COMMUNITY)
Admission: EM | Admit: 2012-10-03 | Discharge: 2012-10-03 | Disposition: A | Discharge status: Home / Self Care | Payer: Medicare Other | Attending: Emergency Medicine | Admitting: Emergency Medicine

## 2012-10-03 ENCOUNTER — Encounter (HOSPITAL_COMMUNITY): Payer: Self-pay | Admitting: *Deleted

## 2012-10-03 DIAGNOSIS — Z8739 Personal history of other diseases of the musculoskeletal system and connective tissue: Secondary | ICD-10-CM | POA: Diagnosis not present

## 2012-10-03 DIAGNOSIS — K0889 Other specified disorders of teeth and supporting structures: Secondary | ICD-10-CM

## 2012-10-03 DIAGNOSIS — J45909 Unspecified asthma, uncomplicated: Secondary | ICD-10-CM | POA: Diagnosis not present

## 2012-10-03 DIAGNOSIS — Z853 Personal history of malignant neoplasm of breast: Secondary | ICD-10-CM | POA: Diagnosis not present

## 2012-10-03 DIAGNOSIS — Z79899 Other long term (current) drug therapy: Secondary | ICD-10-CM | POA: Insufficient documentation

## 2012-10-03 DIAGNOSIS — R0789 Other chest pain: Secondary | ICD-10-CM | POA: Diagnosis not present

## 2012-10-03 DIAGNOSIS — Z8611 Personal history of tuberculosis: Secondary | ICD-10-CM | POA: Insufficient documentation

## 2012-10-03 DIAGNOSIS — Z8659 Personal history of other mental and behavioral disorders: Secondary | ICD-10-CM | POA: Diagnosis not present

## 2012-10-03 DIAGNOSIS — Z87448 Personal history of other diseases of urinary system: Secondary | ICD-10-CM | POA: Insufficient documentation

## 2012-10-03 DIAGNOSIS — R011 Cardiac murmur, unspecified: Secondary | ICD-10-CM | POA: Insufficient documentation

## 2012-10-03 DIAGNOSIS — Z9089 Acquired absence of other organs: Secondary | ICD-10-CM | POA: Diagnosis not present

## 2012-10-03 DIAGNOSIS — K089 Disorder of teeth and supporting structures, unspecified: Secondary | ICD-10-CM | POA: Diagnosis not present

## 2012-10-03 DIAGNOSIS — Z9981 Dependence on supplemental oxygen: Secondary | ICD-10-CM | POA: Diagnosis not present

## 2012-10-03 DIAGNOSIS — K029 Dental caries, unspecified: Secondary | ICD-10-CM | POA: Insufficient documentation

## 2012-10-03 DIAGNOSIS — Z8679 Personal history of other diseases of the circulatory system: Secondary | ICD-10-CM | POA: Diagnosis not present

## 2012-10-03 DIAGNOSIS — Z862 Personal history of diseases of the blood and blood-forming organs and certain disorders involving the immune mechanism: Secondary | ICD-10-CM | POA: Insufficient documentation

## 2012-10-03 DIAGNOSIS — IMO0002 Reserved for concepts with insufficient information to code with codable children: Secondary | ICD-10-CM | POA: Insufficient documentation

## 2012-10-03 DIAGNOSIS — Z87891 Personal history of nicotine dependence: Secondary | ICD-10-CM | POA: Diagnosis not present

## 2012-10-03 DIAGNOSIS — M279 Disease of jaws, unspecified: Secondary | ICD-10-CM | POA: Diagnosis not present

## 2012-10-03 DIAGNOSIS — G4733 Obstructive sleep apnea (adult) (pediatric): Secondary | ICD-10-CM | POA: Insufficient documentation

## 2012-10-03 DIAGNOSIS — I10 Essential (primary) hypertension: Secondary | ICD-10-CM | POA: Diagnosis not present

## 2012-10-03 DIAGNOSIS — Z8701 Personal history of pneumonia (recurrent): Secondary | ICD-10-CM | POA: Insufficient documentation

## 2012-10-03 DIAGNOSIS — Z8639 Personal history of other endocrine, nutritional and metabolic disease: Secondary | ICD-10-CM | POA: Diagnosis not present

## 2012-10-03 DIAGNOSIS — Z8742 Personal history of other diseases of the female genital tract: Secondary | ICD-10-CM | POA: Diagnosis not present

## 2012-10-03 DIAGNOSIS — R6889 Other general symptoms and signs: Secondary | ICD-10-CM | POA: Diagnosis not present

## 2012-10-03 DIAGNOSIS — R109 Unspecified abdominal pain: Secondary | ICD-10-CM | POA: Diagnosis not present

## 2012-10-03 NOTE — ED Notes (Addendum)
Pt in via EMS, per EMS- pt in c/o jaw pain since 3pm today, worse with chewing. Pt was eating a salad when symptoms began.

## 2012-10-03 NOTE — ED Provider Notes (Signed)
Medical screening examination/treatment/procedure(s) were performed by non-physician practitioner and as supervising physician I was immediately available for consultation/collaboration.   Glynn Octave, MD 10/03/12 2325

## 2012-10-03 NOTE — ED Provider Notes (Signed)
History  This chart was scribed for non-physician practitioner working with Glynn Octave, MD. This patient was seen in room [facility] and the patient's care was started at 9:03 PM.  CSN: [id number]  Arrival date & time 10/03/12  2058   Chief Complaint  Patient presents with  . Jaw Pain    The history is provided by the patient. No language interpreter was used.   HPI Comments: Michaela Little is a 52 y.o. female who presents to the Emergency Department complaining of intermittent, moderate, "dull" lower right jaw pain onset about 6 hours ago. Pt states that she was eating a salad when her pain onset. Pt states that her pain becomes worsened and "sharp" when chewing. Pain is constant, but when she tried chewing a piece of gum, her symptoms worsened and became sharp. Pt states that she does not see and has not seen a dentist. Pt denies taking OTC medications at home to improve symptoms. Pt denies dental pain, trouble swallowing, sore throat, fever, vomiting or any other symptoms.  PCP- Dr. Oneal Grout   Past Medical History  Diagnosis Date  . Uterine fibroid   . Hyperlipidemia   . Hypertension   . Asthma   . SVD (spontaneous vaginal delivery)     x 5  . Seasonal allergies   . Arthritis     hands, knees  . History of pneumonia   . Anginal pain     BEEN GOING ON FOR COUPLE MO  . Anxiety   . Shortness of breath     occasional WITH EXERTIO N  . OSA (obstructive sleep apnea)     CPAP  , NOT USING ON REGULAR BASIS  . Tuberculosis     AS CHILD MED TREATMENT   . Heart murmur     AS CHILD   . Headache(784.0)     otc med prn  . Breast cancer 11/2008    left  . Pneumonia     hx  . Dysuria   . Spasm of muscle   . Unspecified vitamin D deficiency   . Other abnormal blood chemistry   . Dysplasia of cervix, unspecified    Past Surgical History  Procedure Laterality Date  . Eye surgery  1961    right  . Cervical conization w/bx      done in [place]  . Colonoscopy  2011     normal per patient - [place]  . Dilation and curettage of uterus  10/15/2011    Procedure: DILATATION AND CURETTAGE;  Surgeon: Bing Plume, MD;  Location: [facility] ORS;  Service: Gynecology;  Laterality: N/A;  Conization  . Cholecystectomy N/A 05/14/2012    Procedure: LAPAROSCOPIC CHOLECYSTECTOMY;  Surgeon: Axel Filler, MD;  Location: [facility];  Service: General;  Laterality: N/A;  . Breast surgery  11/2008     done in [place] - breast cancer  . Mastectomy  11/2008    done in [place], left   Family History  Problem Relation Age of Onset  . Breast cancer Mother   . Colon cancer Mother   . Hypotension Mother   . Asthma Mother   . Diabetes type II Mother   . Arthritis Mother   . Clotting disorder Mother   . Cancer Mother     breast/colon  . Mental illness Brother   . Heart disease Brother   . Cerebral palsy Daughter   . Emphysema Brother     never smoker  . Colon cancer Maternal Aunt   .  Cancer Maternal Aunt     colon  . Colon cancer Maternal Uncle   . Cancer Maternal Uncle     colon   History  Substance Use Topics  . Smoking status: Former Smoker -- 0.30 packs/day for 10 years    Types: Cigarettes    Quit date: 03/26/1988  . Smokeless tobacco: Never Used  . Alcohol Use: No   OB History   Grav Para Term Preterm Abortions TAB SAB Ect Mult Living   10 5 5  5 2 3   5      Review of Systems  Constitutional: Negative for fever and chills.  HENT: Positive for dental problem. Negative for congestion, sore throat, rhinorrhea, trouble swallowing and neck pain.        Jaw pain.  Eyes: Negative for visual disturbance.  Respiratory: Negative for cough and shortness of breath.   Cardiovascular: Positive for chest pain (subsided).  Gastrointestinal: Positive for abdominal pain (subsided). Negative for nausea, vomiting and diarrhea.  Genitourinary: Negative for dysuria.  Musculoskeletal: Negative for back pain.  Skin: Negative for rash.  Neurological: Negative for headaches.   Psychiatric/Behavioral: Negative for confusion.   A complete 10 system review of systems was obtained and all systems are negative except as noted in the HPI and PMH.   Allergies  Calcium carbonate antacid; Hctz; and Lisinopril  Home Medications   Current Outpatient Rx  Name  Route  Sig  Dispense  Refill  . albuterol (PROVENTIL HFA;VENTOLIN HFA) 108 (90 BASE) MCG/ACT inhaler   Inhalation   Inhale 2 puffs into the lungs every 6 (six) hours as needed. For wheezing         . amLODipine (NORVASC) 10 MG tablet   Oral   Take 10 mg by mouth daily.         Marland Kitchen anastrozole (ARIMIDEX) 1 MG tablet   Oral   Take 1 mg by mouth daily.          . fluticasone (FLOVENT HFA) 44 MCG/ACT inhaler   Inhalation   Inhale 2 puffs into the lungs 2 (two) times daily as needed. For asthma         . furosemide (LASIX) 40 MG tablet   Oral   Take 40 mg by mouth daily as needed. For leg swelling         . loratadine (CLARITIN) 10 MG tablet   Oral   Take 10 mg by mouth daily as needed for allergies.          . montelukast (SINGULAIR) 10 MG tablet   Oral   Take 10 mg by mouth at bedtime.         Marland Kitchen omeprazole (PRILOSEC) 20 MG capsule   Oral   Take 20 mg by mouth at bedtime.         Marland Kitchen oxyCODONE-acetaminophen (PERCOCET/ROXICET) 5-325 MG per tablet   Oral   Take 1 tablet by mouth every 6 (six) hours as needed for pain.   65 tablet   0   . polyethylene glycol (MIRALAX / GLYCOLAX) packet   Oral   Take 17 g by mouth daily as needed. For constipation         . promethazine (PHENERGAN) 25 MG suppository   Rectal   Place 1 suppository (25 mg total) rectally every 6 (six) hours as needed for nausea.   12 each   0   . promethazine (PHENERGAN) 25 MG tablet   Oral   Take 1 tablet (25 mg total)  by mouth every 6 (six) hours as needed for nausea.   65 tablet   0    Triage Vitals: BP 152/78  Pulse 78  Temp(Src) 97.4 F (36.3 C) (Oral)  Resp 20  SpO2 100%  LMP  02/25/2009  Physical Exam  Nursing note and vitals reviewed. Constitutional: She is oriented to person, place, and time. She appears well-developed and well-nourished. No distress.  HENT:  Head: Normocephalic and atraumatic.  Mouth/Throat: Oropharynx is clear and moist.  Multiple decayed teeth and dental caries on right lower jaw. Tender along right mandible. No erythema, edema, abscess. No lymphadenopathy.  Eyes: Conjunctivae and EOM are normal.  Neck: Normal range of motion. Neck supple.  Cardiovascular: Normal rate, regular rhythm and normal heart sounds.   Pulmonary/Chest: Effort normal and breath sounds normal. No respiratory distress.  Musculoskeletal: Normal range of motion. She exhibits no edema.  Neurological: She is alert and oriented to person, place, and time. No sensory deficit.  Skin: Skin is warm and dry. No erythema.  Psychiatric: She has a normal mood and affect. Her behavior is normal.    ED Course  Procedures (including critical care time)  DIAGNOSTIC STUDIES: Oxygen Saturation is 100% on RA, normal by my interpretation.    COORDINATION OF CARE: 9:13 PM- Pt advised of plan for treatment and pt agrees.    1. Pain, dental     MDM  Patient with right sided jaw pain, worse with chewing. Tender to palpation of right mandible, poor dentition. No dental infection, abscess. Normal vital signs. Advised patient to f/u with dentist.  NSAIDs for pain. Return precautions discussed. Patient states understanding of plan and is agreeable.      I personally performed the services described in this documentation, which was scribed in my presence. The recorded information has been reviewed and is accurate.   Trevor Mace, PA-C 10/03/12 2120

## 2012-10-04 LAB — URINE CULTURE

## 2012-10-07 ENCOUNTER — Other Ambulatory Visit: Payer: Self-pay | Admitting: Oncology

## 2012-10-07 DIAGNOSIS — Z853 Personal history of malignant neoplasm of breast: Secondary | ICD-10-CM

## 2012-10-08 DIAGNOSIS — L0201 Cutaneous abscess of face: Secondary | ICD-10-CM | POA: Diagnosis not present

## 2012-10-08 DIAGNOSIS — A048 Other specified bacterial intestinal infections: Secondary | ICD-10-CM | POA: Diagnosis not present

## 2012-10-08 DIAGNOSIS — R300 Dysuria: Secondary | ICD-10-CM | POA: Diagnosis not present

## 2012-10-08 DIAGNOSIS — L03211 Cellulitis of face: Secondary | ICD-10-CM | POA: Diagnosis not present

## 2012-10-08 DIAGNOSIS — R109 Unspecified abdominal pain: Secondary | ICD-10-CM | POA: Diagnosis not present

## 2012-10-13 DIAGNOSIS — Z124 Encounter for screening for malignant neoplasm of cervix: Secondary | ICD-10-CM | POA: Diagnosis not present

## 2012-10-13 DIAGNOSIS — N949 Unspecified condition associated with female genital organs and menstrual cycle: Secondary | ICD-10-CM | POA: Diagnosis not present

## 2012-10-21 DIAGNOSIS — Z853 Personal history of malignant neoplasm of breast: Secondary | ICD-10-CM | POA: Diagnosis not present

## 2012-10-21 DIAGNOSIS — D259 Leiomyoma of uterus, unspecified: Secondary | ICD-10-CM | POA: Diagnosis not present

## 2012-10-21 DIAGNOSIS — N949 Unspecified condition associated with female genital organs and menstrual cycle: Secondary | ICD-10-CM | POA: Diagnosis not present

## 2012-10-22 ENCOUNTER — Ambulatory Visit (INDEPENDENT_AMBULATORY_CARE_PROVIDER_SITE_OTHER): Payer: Medicare Other | Admitting: Nurse Practitioner

## 2012-10-22 ENCOUNTER — Encounter: Payer: Self-pay | Admitting: Nurse Practitioner

## 2012-10-22 VITALS — BP 120/70 | HR 94 | Ht 65.0 in | Wt 259.8 lb

## 2012-10-22 DIAGNOSIS — R0609 Other forms of dyspnea: Secondary | ICD-10-CM

## 2012-10-22 DIAGNOSIS — R0989 Other specified symptoms and signs involving the circulatory and respiratory systems: Secondary | ICD-10-CM

## 2012-10-22 DIAGNOSIS — I5189 Other ill-defined heart diseases: Secondary | ICD-10-CM

## 2012-10-22 DIAGNOSIS — R0789 Other chest pain: Secondary | ICD-10-CM | POA: Diagnosis not present

## 2012-10-22 DIAGNOSIS — I519 Heart disease, unspecified: Secondary | ICD-10-CM | POA: Diagnosis not present

## 2012-10-22 DIAGNOSIS — R0600 Dyspnea, unspecified: Secondary | ICD-10-CM

## 2012-10-22 LAB — BRAIN NATRIURETIC PEPTIDE: Pro B Natriuretic peptide (BNP): 9 pg/mL (ref 0.0–100.0)

## 2012-10-22 LAB — BASIC METABOLIC PANEL
BUN: 10 mg/dL (ref 6–23)
CO2: 27 mEq/L (ref 19–32)
Calcium: 9.1 mg/dL (ref 8.4–10.5)
Chloride: 105 mEq/L (ref 96–112)
Creatinine, Ser: 0.7 mg/dL (ref 0.4–1.2)
GFR: 104.96 mL/min (ref >60.00)
Glucose, Bld: 114 mg/dL — ABNORMAL HIGH (ref 70–99)
Potassium: 3.8 mEq/L (ref 3.5–5.1)
Sodium: 138 mEq/L (ref 135–145)

## 2012-10-22 NOTE — Progress Notes (Signed)
Michaela Little Date of Birth: 08-10-1960 Medical Record #[id number]  History of Present Illness: Michaela Little is seen back today for a post ER visit. Seen for Dr. Swaziland. She has asthma, OSA, HTN, brast cancer, obesity, diastolic dysfunction, atypical chest pain with past normal Myoview, HLD and chronic abdominal pain.   Seen here a year ago for evaluation of diastolic dysfunction. No clinical evidence of CHF. This was felt to be related to her HTN and morbid obesity and OSA.   Most recently in the ER earlier this month with a multitude of complaints. She had pain on her entire right side. She continues to have chronic abdominal pain. She was having arthritis in the right leg and Little. Complaining of DOE which is also chronic - has seen Dr. Shelle Iron in the past. Some chest pressure. Was observed overnight. Negative enzymes. Negative CT of the abdomen.   Comes back today. Here alone. Still with lots of issues. Now goes to [facility] for her primary care needs. Remains short of breath - seems unchanged and a chronic issue - has not seen pulmonary. No follow up made as well. Some continued chest discomfort. Nothing exertional. She does not exert herself. Some flutters on occasion but not really dizzy or lightheaded. Went back to the ER for some jaw pain while eating a salad - this was not exertional as well. BP is ok.    Current Outpatient Prescriptions  Medication Sig Dispense Refill  . albuterol (PROVENTIL HFA;VENTOLIN HFA) 108 (90 BASE) MCG/ACT inhaler Inhale 2 puffs into the lungs every 6 (six) hours as needed. For wheezing      . amLODipine (NORVASC) 10 MG tablet Take 10 mg by mouth daily.      Marland Kitchen anastrozole (ARIMIDEX) 1 MG tablet TAKE 1 TABLET EVERY DAY  90 tablet  1  . clarithromycin (BIAXIN) 500 MG tablet Take 500 mg by mouth 2 (two) times daily.       . cyclobenzaprine (FLEXERIL) 5 MG tablet Take 5 mg by mouth 2 (two) times daily as needed.       . fluticasone (FLOVENT HFA) 44 MCG/ACT  inhaler Inhale 2 puffs into the lungs 2 (two) times daily as needed. For asthma      . loratadine (CLARITIN) 10 MG tablet Take 10 mg by mouth daily as needed for allergies.       . metroNIDAZOLE (FLAGYL) 500 MG tablet Take 500 mg by mouth 3 (three) times daily.       . montelukast (SINGULAIR) 10 MG tablet Take 10 mg by mouth at bedtime.      Marland Kitchen omeprazole (PRILOSEC) 20 MG capsule Take 20 mg by mouth at bedtime.      Marland Kitchen oxyCODONE-acetaminophen (PERCOCET/ROXICET) 5-325 MG per tablet Take 1 tablet by mouth every 6 (six) hours as needed for pain.  65 tablet  0  . polyethylene glycol (MIRALAX / GLYCOLAX) packet Take 17 g by mouth daily as needed. For constipation      . promethazine (PHENERGAN) 25 MG suppository Place 1 suppository (25 mg total) rectally every 6 (six) hours as needed for nausea.  12 each  0  . promethazine (PHENERGAN) 25 MG tablet Take 1 tablet (25 mg total) by mouth every 6 (six) hours as needed for nausea.  65 tablet  0   No current facility-administered medications for this visit.    Allergies  Allergen Reactions  . Calcium Carbonate Antacid Hives    Fruit flavored Tums   . Hctz (  Hydrochlorothiazide) Itching  . Lisinopril Itching    Past Medical History  Diagnosis Date  . Uterine fibroid   . Hyperlipidemia   . Hypertension   . Asthma   . SVD (spontaneous vaginal delivery)     x 5  . Seasonal allergies   . Arthritis     hands, knees  . History of pneumonia   . Anginal pain     BEEN GOING ON FOR COUPLE MO  . Anxiety   . Shortness of breath     occasional WITH EXERTIO N  . OSA (obstructive sleep apnea)     CPAP  , NOT USING ON REGULAR BASIS  . Tuberculosis     AS CHILD MED TREATMENT   . Heart murmur     AS CHILD   . Headache(784.0)     otc med prn  . Breast cancer 11/2008    left  . Pneumonia     hx  . Dysuria   . Spasm of muscle   . Unspecified vitamin D deficiency   . Other abnormal blood chemistry   . Dysplasia of cervix, unspecified     Past  Surgical History  Procedure Laterality Date  . Eye surgery  1961    right  . Cervical conization w/bx      done in [place]  . Colonoscopy  2011    normal per patient - [place]  . Dilation and curettage of uterus  10/15/2011    Procedure: DILATATION AND CURETTAGE;  Surgeon: Bing Plume, MD;  Location: [facility];  Service: Gynecology;  Laterality: N/A;  Conization  . Cholecystectomy N/A 05/14/2012    Procedure: LAPAROSCOPIC CHOLECYSTECTOMY;  Surgeon: Axel Filler, MD;  Location: [facility];  Service: General;  Laterality: N/A;  . Breast surgery  11/2008     done in [place] - breast cancer  . Mastectomy  11/2008    done in [place], left    History  Smoking status  . Former Smoker -- 0.30 packs/day for 10 years  . Types: Cigarettes  . Quit date: 03/26/1988  Smokeless tobacco  . Never Used    History  Alcohol Use No    Family History  Problem Relation Age of Onset  . Breast cancer Mother   . Colon cancer Mother   . Hypotension Mother   . Asthma Mother   . Diabetes type II Mother   . Arthritis Mother   . Clotting disorder Mother   . Cancer Mother     breast/colon  . Mental illness Brother   . Heart disease Brother   . Cerebral palsy Daughter   . Emphysema Brother     never smoker  . Colon cancer Maternal Aunt   . Cancer Maternal Aunt     colon  . Colon cancer Maternal Uncle   . Cancer Maternal Uncle     colon    Review of Systems: The review of systems is per the HPI.  All other systems were reviewed and are negative.  Physical Exam: BP 120/70  Pulse 94  Ht 5\' 5"  (1.651 m)  Wt 259 lb 12.8 oz (117.845 kg)  BMI 43.23 kg/m2  SpO2 100%  LMP 02/25/2009 Patient is alert and in no acute distress. She is morbidly obese. Weight is down 7 pounds since her last visit here a year ago. Her affect is quite flat. Skin is warm and dry. Color is normal.  HEENT is unremarkable but she does have lots of masculine features. Normocephalic/atraumatic. PERRL. Sclera  are nonicteric. Neck is supple. No  masses. No JVD. Lungs are clear. Cardiac exam shows a regular rate and rhythm. Abdomen is soft. Extremities are without edema. Gait and ROM are intact. No gross neurologic deficits noted.  LABORATORY DATA: BMET and BNP pending  Lab Results  Component Value Date   WBC 4.0 10/01/2012   HGB 13.9 10/01/2012   HCT 42.4 10/01/2012   PLT 238 10/01/2012   GLUCOSE 94 10/01/2012   CHOL 195 03/05/2011   TRIG 60 03/05/2011   HDL 48 03/05/2011   LDLCALC 135* 03/05/2011   ALT 10 10/01/2012   AST 18 10/01/2012   NA 138 10/01/2012   K 4.4 10/01/2012   CL 104 10/01/2012   CREATININE 0.67 10/01/2012   BUN 8 10/01/2012   CO2 24 10/01/2012   Lab Results  Component Value Date   TROPONINI <0.30 10/02/2012   Ct Angio Chest Pe W/cm &/or Wo Cm  10/02/2012     IMPRESSION: No evidence of pulmonary embolus.  No acute findings.   Original Report Authenticated By: Charlett Nose, M.D.   Ct Abdomen Pelvis W Contrast  10/01/2012    IMPRESSION:  1.  No acute findings. 2.  Small uterine fibroids, largest measuring 3.5 cm.   Original Report Authenticated By: Myles Rosenthal, M.D.   Dg Chest Port 1 View  10/01/2012    IMPRESSION:  1.  Low lung volumes with bibasilar atelectasis. 2.  Stable borderline cardiomegaly.   Original Report Authenticated By: Malachy Moan, M.D.   Echo Study Conclusions from August 2013  - Left ventricle: The cavity size was normal. Wall thickness was increased in a pattern of mild LVH. Systolic function was normal. The estimated ejection fraction was in the range of 60% to 65%. There was dynamic obstruction. Wall motion was normal; there were no regional wall motion abnormalities. Features are consistent with a pseudonormal left ventricular filling pattern, with concomitant abnormal relaxation and increased filling pressure (grade 2 diastolic dysfunction). - Atrial septum: No defect or patent foramen ovale was identified.   Assessment / Plan:  1. Multitude of somatic complaints   2. Morbid obesity   3.  Chronic DOE - will try to get her back to see Dr. Shelle Iron. Check BNP and BMET today as well.   4. Diastolic dysfunction - preserved EF - last echo was last summer. I have not repeated.   5. Chronic abdominal pain  6. Atypical chest pain - jaw pain - will update her Lexiscan. She is not able to exercise due to chronic joint issues.   Patient is agreeable to this plan and will call if any problems develop in the interim.   Rosalio Macadamia, RN, ANP-C [facility] [address] [place], [place]  [address]

## 2012-10-22 NOTE — Patient Instructions (Addendum)
Stay on your current medicines  We are going to arrange for a stress test Eugenie Birks)  We are going to check some labs today  We will get you a follow up visit with Dr. Shelle Iron   Call the [facility] office at [phone] if you have any questions, problems or concerns.

## 2012-10-23 ENCOUNTER — Telehealth: Payer: Self-pay | Admitting: Nurse Practitioner

## 2012-10-23 NOTE — Telephone Encounter (Signed)
Message copied by Burnell Blanks on Thu Oct 23, 2012  9:53 AM ------      Message from: Rosalio Macadamia      Created: Wed Oct 22, 2012  4:13 PM       Ok to report. Labs look fine. The fluid level test is totally normal. Needs to see Dr. Shelle Iron. ------

## 2012-10-23 NOTE — Telephone Encounter (Signed)
Follow up ° °Pt is calling regarding her lab results.  °

## 2012-10-23 NOTE — Telephone Encounter (Signed)
Advised patient of lab results and will fax to Dr Shelle Iron

## 2012-10-28 ENCOUNTER — Encounter: Payer: Self-pay | Admitting: Internal Medicine

## 2012-10-28 ENCOUNTER — Ambulatory Visit (INDEPENDENT_AMBULATORY_CARE_PROVIDER_SITE_OTHER): Payer: Medicare Other | Admitting: Internal Medicine

## 2012-10-28 VITALS — BP 138/82 | HR 72 | Temp 97.4°F | Ht 65.0 in | Wt 260.4 lb

## 2012-10-28 DIAGNOSIS — R05 Cough: Secondary | ICD-10-CM | POA: Insufficient documentation

## 2012-10-28 DIAGNOSIS — R059 Cough, unspecified: Secondary | ICD-10-CM

## 2012-10-28 DIAGNOSIS — J45909 Unspecified asthma, uncomplicated: Secondary | ICD-10-CM | POA: Diagnosis not present

## 2012-10-28 DIAGNOSIS — R0989 Other specified symptoms and signs involving the circulatory and respiratory systems: Secondary | ICD-10-CM

## 2012-10-28 DIAGNOSIS — R0609 Other forms of dyspnea: Secondary | ICD-10-CM | POA: Diagnosis not present

## 2012-10-28 DIAGNOSIS — R0600 Dyspnea, unspecified: Secondary | ICD-10-CM

## 2012-10-28 DIAGNOSIS — J4520 Mild intermittent asthma, uncomplicated: Secondary | ICD-10-CM

## 2012-10-28 MED ORDER — TRAMADOL HCL 50 MG PO TABS: ORAL_TABLET | ORAL | Status: DC

## 2012-10-28 MED ORDER — PREDNISONE (PAK) 10 MG PO TABS: ORAL_TABLET | ORAL | Status: DC

## 2012-10-28 MED ORDER — FAMOTIDINE 20 MG PO TABS: ORAL_TABLET | ORAL | Status: DC

## 2012-10-28 MED ORDER — OMEPRAZOLE 20 MG PO CPDR: DELAYED_RELEASE_CAPSULE | ORAL | Status: DC

## 2012-10-28 NOTE — Progress Notes (Signed)
  Subjective:    Patient ID: Michaela Little, female    DOB: 1960/06/01 .   MRN: [id number]    Brief patient profile:  3  yobf quit smoking 1990 with asthma dx  around age 52 worse symptoms since around 2004 when eval in  [place] [facility] moved to [place]  10/2010 and referred to pulmonary clinic by Dr Glade Lloyd 02/08/2011   02/08/2011 1st pulmonary eval cc doe x 50 ft plus episodes of breathing attacks(Triggers are emotional, smells, uri's )  where can't breath at rest baseline qod albuterol much better when she takes her flovent, assoc with sensation on pnds/ no purulent sputum.  rec Work on hfa, no change rx > f/u in 3 months with pft's planned > did not return  10/28/2012 f/u ov/Collier Bohnet re asthma Chief Complaint  Patient presents with  . Follow-up    Last seen 02/08/11. Pt c/o increased SOB. Pt states that Cardiologist is unsiure if SOB is related to asthma or her heart.   doe x one aisle at HT, sob nightly, no longer able to use cpap, on flovent bid and alb last dose > 8 h prior to OV  But "didn't help",  Prominent dry cough to point choking and vomit x over a year. Coughs so hard gets "crampy pains all over chest"  No obvious daytime  Or day to day variabilty or assoc chronic cough or cp or chest tightness, subjective wheeze overt sinus or hb symptoms. No unusual exp hx or h/o childhood pna/ asthma or knowledge of premature birth.   Sleeping ok without nocturnal  or early am exacerbation  of respiratory  c/o's or need for noct saba. Also denies any obvious fluctuation of symptoms with weather or environmental changes or other aggravating or alleviating factors except as outlined above   Current Medications, Allergies, Past Medical History, Past Surgical History, Family History, and Social History were reviewed in [employer] record.  ROS  The following are not active complaints unless bolded sore throat, dysphagia, dental problems, itching, sneezing,  nasal congestion or excess/  purulent secretions, ear ache,   fever, chills, sweats, unintended wt loss, pleuritic or exertional cp, hemoptysis,  orthopnea pnd or leg swelling, presyncope, palpitations, heartburn, abdominal pain, anorexia, nausea, vomiting, diarrhea  or change in bowel or urinary habits, change in stools or urine, dysuria,hematuria,  rash, arthralgias, visual complaints, headache, numbness weakness or ataxia or problems with walking or coordination,  change in mood/affect or memory.             Objective:   Physical Exam   Obese bf quite hoarse with classic pseudowheeze Wt 285 02/08/2011 >  260 10/28/2012  HEENT: nl dentition, turbinates, and orophanx. Nl external ear canals without cough reflex   NECK :  without JVD/Nodes/TM/ nl carotid upstrokes bilaterally   LUNGS: no acc muscle use, clear to A and P bilaterally without cough on insp or exp maneuvers   CV:  RRR  no s3 or murmur or increase in P2, no edema   ABD:  soft and nontender with nl excursion in the supine position. No bruits or organomegaly, bowel sounds nl  MS:  warm without deformities, calf tenderness, cyanosis or clubbing  SKIN: warm and dry without lesions    NEURO:  alert, approp, no deficits     Cta chest 10/01/12 No evidence of pulmonary embolus. No acute findings.   Assessment & Plan:

## 2012-10-28 NOTE — Patient Instructions (Addendum)
Prednisone 10 mg take  4 each am x 2 days,   2 each am x 2 days,  1 each am x 2 days and stop   Take delsym two tsp every 12 hours and supplement if needed with  tramadol 50 mg up to 2 every 4 hours to suppress the urge to cough. Swallowing water or using ice chips/non mint and menthol containing candies (such as lifesavers or sugarless jolly ranchers) are also effective.  You should rest your voice and avoid activities that you know make you cough.  Once you have eliminated the cough for 3 straight days try reducing the tramadol first,  then the delsym as tolerated.    Stop flovent and albuterol.  GERD (REFLUX)  is an extremely common cause of respiratory symptoms, many times with no significant heartburn at all.    It can be treated with medication, but also with lifestyle changes including avoidance of late meals, excessive alcohol, smoking cessation, and avoid fatty foods, chocolate, peppermint, colas, red wine, and acidic juices such as orange juice.  NO MINT OR MENTHOL PRODUCTS SO NO COUGH DROPS  USE SUGARLESS CANDY INSTEAD (jolley ranchers or Stover's)  NO OIL BASED VITAMINS - use powdered substitutes.    Prilosec 20  Mg Take 30- 60 min before your first and last meals of the day and Pepcid (famotidine) 20 mg one at bedtime.  See Tammy NP w/in 2 weeks with all your medications, even over the counter meds, separated in two separate bags, the ones you take no matter what vs the ones you stop once you feel better and take only as needed when you feel you need them.   Tammy  will generate for you a new user friendly medication calendar that will put [place] all on the same page re: your medication use.     Without this process, it simply isn't possible to assure that we are providing  your outpatient care  with  the attention to detail we feel you deserve.   If we cannot assure that you're getting that kind of care,  then we cannot manage your problem effectively from this clinic.  Once you have  seen Tammy and we are sure that we're all on the same page with your medication use she will arrange follow up with me.

## 2012-10-29 ENCOUNTER — Ambulatory Visit (HOSPITAL_COMMUNITY): Payer: Medicare Other | Attending: Cardiology | Admitting: Radiology

## 2012-10-29 VITALS — BP 121/79 | Ht 65.0 in | Wt 257.0 lb

## 2012-10-29 DIAGNOSIS — R079 Chest pain, unspecified: Secondary | ICD-10-CM | POA: Insufficient documentation

## 2012-10-29 DIAGNOSIS — E785 Hyperlipidemia, unspecified: Secondary | ICD-10-CM | POA: Diagnosis not present

## 2012-10-29 DIAGNOSIS — R002 Palpitations: Secondary | ICD-10-CM | POA: Insufficient documentation

## 2012-10-29 DIAGNOSIS — Z8249 Family history of ischemic heart disease and other diseases of the circulatory system: Secondary | ICD-10-CM | POA: Diagnosis not present

## 2012-10-29 DIAGNOSIS — I10 Essential (primary) hypertension: Secondary | ICD-10-CM | POA: Insufficient documentation

## 2012-10-29 DIAGNOSIS — R0789 Other chest pain: Secondary | ICD-10-CM

## 2012-10-29 DIAGNOSIS — R42 Dizziness and giddiness: Secondary | ICD-10-CM | POA: Diagnosis not present

## 2012-10-29 DIAGNOSIS — R0609 Other forms of dyspnea: Secondary | ICD-10-CM | POA: Diagnosis not present

## 2012-10-29 DIAGNOSIS — I5189 Other ill-defined heart diseases: Secondary | ICD-10-CM

## 2012-10-29 DIAGNOSIS — Z87891 Personal history of nicotine dependence: Secondary | ICD-10-CM | POA: Diagnosis not present

## 2012-10-29 DIAGNOSIS — R0600 Dyspnea, unspecified: Secondary | ICD-10-CM

## 2012-10-29 DIAGNOSIS — R0989 Other specified symptoms and signs involving the circulatory and respiratory systems: Secondary | ICD-10-CM | POA: Insufficient documentation

## 2012-10-29 DIAGNOSIS — R0602 Shortness of breath: Secondary | ICD-10-CM

## 2012-10-29 HISTORY — PX: TRANSTHORACIC ECHOCARDIOGRAM: SHX275

## 2012-10-29 HISTORY — PX: CARDIOVASCULAR STRESS TEST: SHX262

## 2012-10-29 MED ORDER — REGADENOSON 0.4 MG/5ML IV SOLN
0.4000 mg | Freq: Once | INTRAVENOUS | Status: AC
  Administered 2012-10-29: 0.4 mg via INTRAVENOUS

## 2012-10-29 MED ORDER — TECHNETIUM TC 99M SESTAMIBI GENERIC - CARDIOLITE
33.0000 | Freq: Once | INTRAVENOUS | Status: AC | PRN
  Administered 2012-10-29: 33 via INTRAVENOUS

## 2012-10-29 NOTE — Progress Notes (Signed)
[facility] SITE 3 NUCLEAR MED [address] [place], [place] [address] [phone]    Cardiology Nuclear Med Study  [address] Michaela Little is a 52 y.o. female     MRN : [id number]     DOB: [birth year]  Procedure Date: 10/29/2012  Nuclear Med Background Indication for Stress Test:  Evaluation for Ischemia History:  2013 Echo EF 60-65% Cardiac Risk Factors: Family History - CAD, History of Smoking, Hypertension and Lipids  Symptoms:  Chest Pain, Dizziness, DOE, Palpitations and SOB   Nuclear Pre-Procedure Caffeine/Decaff Intake:  9:30pm Decaffeinated tea  NPO After: 10:00pm   Lungs:  clear O2 Sat: 96% on room air. IV 0.9% NS with Angio Cath:  22g  IV Site: R Antecubital x 1, tolerated well IV Started by:  Milana Na, EMT-P  Chest Size (in):  38 Cup Size: D  Height: 5\' 5"  (1.651 m)  Weight:  257 lb (116.574 kg)  BMI:  Body mass index is 42.77 kg/(m^2). Tech Comments:  No medications today    Nuclear Med Study 1 or 2 day study: 2 day  Stress Test Type:  Lexiscan  Reading MD: Marca Ancona, MD  Order Authorizing Provider:  Peter Swaziland, MD  Resting Radionuclide: Technetium 23m Sestamibi  Resting Radionuclide Dose: 33.0 mCi  10/30/12  Stress Radionuclide:  Technetium 70m Sestamibi  Stress Radionuclide Dose: 33.0 mCi   10/29/12          Stress Protocol Rest HR: 72 Stress HR: 97  Rest BP: 121/79 Stress BP: 166/95  Exercise Time (min): n/a METS: n/a   Predicted Max HR: 166 bpm % Max HR: 58.43 bpm Rate Pressure Product: 28413   Dose of Adenosine (mg):  n/a Dose of Lexiscan: 0.4 mg  Dose of Atropine (mg): n/a Dose of Dobutamine: n/a mcg/kg/min (at max HR)  Stress Test Technologist: Milana Na, EMT-P  Nuclear Technologist:  Doyne Keel, CNMT     Rest Procedure:  Myocardial perfusion imaging was performed at rest 45 minutes following the intravenous administration of Technetium 24m Sestamibi. Rest ECG: NSR - Normal EKG  Stress Procedure:  The patient received IV  Lexiscan 0.4 mg over 15-seconds.  Technetium 56m Sestamibi injected at 30-seconds. This patient had sob, and tingling in her hands and legs.  Quantitative spect images were obtained after a 45 minute delay. Stress ECG: No significant change from baseline ECG  QPS Raw Data Images:  Normal; no motion artifact; normal heart/lung ratio. Stress Images:  There is decreased uptake in the apex. Rest Images:  There is decreased uptake in the apex. Subtraction (SDS):  No reversibility is appreciated. Transient Ischemic Dilatation (Normal <1.22):  n/a Lung/Heart Ratio (Normal <0.45):  0.37  Quantitative Gated Spect Images QGS EDV:  109 ml QGS ESV:  37 ml  Impression Exercise Capacity:  Lexiscan with no exercise. BP Response:  Normal blood pressure response. Clinical Symptoms:  No significant symptoms noted. ECG Impression:  No significant ST segment change suggestive of ischemia. Comparison with Prior Nuclear Study: No images to compare  Overall Impression:  Low risk stress nuclear study.   There is a small apical defect seen on stress and rest images which may represent accentuated apical attenuation. Old small apical scar cannot be excluded. There is no significant reversibility. There is good LV function without segmental wall motion abnormalities.  LV Ejection Fraction: 66%.  LV Wall Motion:  NL LV Function; NL Wall Motion  [employer]

## 2012-10-29 NOTE — Assessment & Plan Note (Signed)
-   10/28/2012   Walked RA x one lap @ 185 stopped due to sob/ prominent pseudowheeze  No desat   Symptoms are markedly disproportionate to objective findings and not clear this is a lung problem but pt does appear to have difficult airway management issues. See asthma/ cough a/p.

## 2012-10-29 NOTE — Assessment & Plan Note (Signed)
-   Spirometry 10/28/12 > min airflow obstruction   DDX of  difficult airways managment all start with A and  include Adherence, Ace Inhibitors, Acid Reflux, Active Sinus Disease, Alpha 1 Antitripsin deficiency, Anxiety masquerading as Airways dz,  ABPA,  allergy(esp in young), Aspiration (esp in elderly), Adverse effects of DPI,  Active smokers, plus two Bs  = Bronchiectasis and Beta blocker use..and one C= CHF  Adherence is always the initial "prime suspect" and is a multilayered concern that requires a "trust but verify" approach in every patient - starting with knowing how to use medications, especially inhalers, correctly, keeping up with refills and understanding the fundamental difference between maintenance and prns vs those medications only taken for a very short course and then stopped and not refilled. The proper method of use, as well as anticipated side effects, of a metered-dose inhaler are discussed and demonstrated to the patient. Improved effectiveness after extensive coaching during this visit to a level of approximately  75% but not clear at all she's benefiting from flovent at this point > try off  ? Acid reflux strongly suggested by hx > try max acid suppression and diet  ? Anxiety > usually a dx of exclusion but much higher on ddx based on today's eval   See instructions for specific recommendations which were reviewed directly with the patient who was given a copy with highlighter outlining the key components.

## 2012-10-29 NOTE — Assessment & Plan Note (Addendum)
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   These are the same pts (now being labeled as having "irritable larynx syndrome" by some cough centers) who not infrequently have a history of having failed to tolerate ace inhibitors,  dry powder inhalers or biphosphonates or report having atypical reflux symptoms that don't respond to standard doses of PPI , and are easily confused as having aecopd or asthma flares by even experienced allergists/ pulmonologists.  Rec elmininate cyclical cough then regroup with full medication reconciliation.   To keep things simple, I have asked the patient to first separate medicines that are perceived as maintenance, that is to be taken daily "no matter what", from those medicines that are taken on only on an as-needed basis and I have given the patient examples of both, and then return to see our NP to generate a  detailed  medication calendar which should be followed until the next physician sees the patient and updates it.

## 2012-10-30 ENCOUNTER — Ambulatory Visit (HOSPITAL_COMMUNITY): Payer: Medicare Other | Attending: Cardiology

## 2012-10-30 DIAGNOSIS — R0989 Other specified symptoms and signs involving the circulatory and respiratory systems: Secondary | ICD-10-CM

## 2012-10-30 MED ORDER — TECHNETIUM TC 99M SESTAMIBI GENERIC - CARDIOLITE
30.0000 | Freq: Once | INTRAVENOUS | Status: AC | PRN
  Administered 2012-10-30: 30 via INTRAVENOUS

## 2012-11-03 ENCOUNTER — Telehealth: Payer: Self-pay | Admitting: Nurse Practitioner

## 2012-11-03 DIAGNOSIS — R82998 Other abnormal findings in urine: Secondary | ICD-10-CM | POA: Diagnosis not present

## 2012-11-03 DIAGNOSIS — R300 Dysuria: Secondary | ICD-10-CM | POA: Diagnosis not present

## 2012-11-03 NOTE — Telephone Encounter (Signed)
New Prob  Inquiring about test results.. Request urgent call back

## 2012-11-03 NOTE — Telephone Encounter (Signed)
S/w pt wanted test results from pt stress test that was a 2 day protocol that was given  On 8/6 and 8/7 I do not have them yet will call pt when I have results

## 2012-11-04 ENCOUNTER — Telehealth: Payer: Self-pay

## 2012-11-04 NOTE — Telephone Encounter (Signed)
Follow up  Pt states she received a called yesterday regarding her test results.

## 2012-11-04 NOTE — Telephone Encounter (Signed)
Advised patient   Notes Recorded by Peter M Swaziland, MD on 10/31/2012 at 9:08 AM myoview is low risk. No ischemia noted. EF is normal. Reassure her from cardiac standpoint.  Peter Swaziland MD, [facility]

## 2012-11-04 NOTE — Telephone Encounter (Signed)
Patient called 11/03/12 no answer.LMTC. Patient called today by Regis Bill LPN with [facility] results.

## 2012-11-13 ENCOUNTER — Encounter: Payer: Self-pay | Admitting: Adult Health

## 2012-11-13 ENCOUNTER — Ambulatory Visit (INDEPENDENT_AMBULATORY_CARE_PROVIDER_SITE_OTHER): Payer: Medicare Other | Admitting: Adult Health

## 2012-11-13 VITALS — BP 134/78 | HR 75 | Temp 98.5°F | Ht 65.0 in | Wt 259.2 lb

## 2012-11-13 DIAGNOSIS — J45909 Unspecified asthma, uncomplicated: Secondary | ICD-10-CM | POA: Diagnosis not present

## 2012-11-13 NOTE — Progress Notes (Signed)
Subjective:    Patient ID: Michaela Little, female    DOB: [birth year] .   MRN: [id number]    Brief patient profile:  3  yobf quit smoking 1990 with asthma dx  around age 52 worse symptoms since around 2004 when eval in  [facility] moved to [place]  10/2010 and referred to pulmonary clinic by Dr Glade Lloyd 02/08/2011   02/08/2011 1st pulmonary eval cc doe x 50 ft plus episodes of breathing attacks(Triggers are emotional, smells, uri's )  where can't breath at rest baseline qod albuterol much better when she takes her flovent, assoc with sensation on pnds/ no purulent sputum.  rec Work on hfa, no change rx > f/u in 3 months with pft's planned > did not return  10/28/2012 f/u ov/Wert re asthma Chief Complaint  Patient presents with  . Follow-up    Last seen 02/08/11. Pt c/o increased SOB. Pt states that Cardiologist is unsiure if SOB is related to asthma or her heart.   doe x one aisle at HT, sob nightly, no longer able to use cpap, on flovent bid and alb last dose > 8 h prior to OV  But "didn't help",  Prominent dry cough to point choking and vomit x over a year. Coughs so hard gets "crampy pains all over chest"  >>pred taper , d/c flovent , rx tramadol   11/13/2012 followup and medication review. Patient returns for a two-week followup and medication review. We reviewed all her medications and organized them into a medication calendar with patient education. Patient was having difficulty with persistent cough. Last visit. Was given a prednisone taper. She was recommended to discontinue her Flovent inhaler and was given tramadol for help with cough control. Patient reports that she is improved, and cough has substantially decreased. She does report that she was unable to tolerate tramadol .  We reviewed all her medications and organized them medication calendar with patient education.  She denies any hemoptysis, orthopnea, PND, or leg swelling   Spirometry last ov with minimal airflow obstruction noted.  , Flovent was stopped.    Current Medications, Allergies, Past Medical History, Past Surgical History, Family History, and Social History were reviewed in [employer] record.  ROS  The following are not active complaints unless bolded sore throat, dysphagia, dental problems, itching, sneezing,  nasal congestion or excess/ purulent secretions, ear ache,   fever, chills, sweats, unintended wt loss, pleuritic or exertional cp, hemoptysis,  orthopnea pnd or leg swelling, presyncope, palpitations, heartburn  anorexia, nausea, vomiting, diarrhea  or change in bowel or urinary habits, change in stools or urine, dysuria,hematuria,  rash, arthralgias, visual complaints, headache, numbness weakness or ataxia or problems with walking or coordination,  change in mood/affect or memory.             Objective:   Physical Exam   Obese bf quite  Wt 285 02/08/2011 >  260 10/28/2012 >259 11/13/2012  HEENT: nl dentition, turbinates, and orophanx. Nl external ear canals without cough reflex   NECK :  without JVD/Nodes/TM/ nl carotid upstrokes bilaterally   LUNGS: no acc muscle use, clear to A and P bilaterally without cough on insp or exp maneuvers   CV:  RRR  no s3 or murmur or increase in P2, no edema   ABD:  soft and nontender with nl excursion in the supine position. No bruits or organomegaly, bowel sounds nl  MS:  warm without deformities, calf tenderness, cyanosis or clubbing  SKIN: warm and  dry without lesions    NEURO:  alert, approp, no deficits     Cta chest 10/01/12 No evidence of pulmonary embolus. No acute findings.   Assessment & Plan:

## 2012-11-13 NOTE — Assessment & Plan Note (Addendum)
Recent flare vs cyclical cough. Patient improved, status post steroid taper. Patient's medications were reviewed today and patient education was given. Computerized medication calendar was adjusted/completed   Plan  Follow med calendar closely and bring to each visit follow up Dr. Sherene Sires  In 2-3 weeks and As needed

## 2012-11-13 NOTE — Patient Instructions (Addendum)
Follow med calendar closely and bring to each visit follow up Dr. Sherene Sires  In 2-3 weeks and As needed

## 2012-11-17 NOTE — Addendum Note (Signed)
Addended by: Boone Master E on: 11/17/2012 03:06 PM   Modules accepted: Orders, Medications

## 2012-11-25 DIAGNOSIS — R109 Unspecified abdominal pain: Secondary | ICD-10-CM | POA: Diagnosis not present

## 2012-11-25 DIAGNOSIS — R300 Dysuria: Secondary | ICD-10-CM | POA: Diagnosis not present

## 2012-12-04 ENCOUNTER — Ambulatory Visit (INDEPENDENT_AMBULATORY_CARE_PROVIDER_SITE_OTHER): Payer: Medicare Other | Admitting: Internal Medicine

## 2012-12-04 ENCOUNTER — Encounter: Payer: Self-pay | Admitting: Internal Medicine

## 2012-12-04 ENCOUNTER — Other Ambulatory Visit: Payer: Self-pay | Admitting: Internal Medicine

## 2012-12-04 VITALS — BP 114/74 | HR 78 | Temp 98.2°F | Ht 65.0 in | Wt 262.0 lb

## 2012-12-04 DIAGNOSIS — R0609 Other forms of dyspnea: Secondary | ICD-10-CM | POA: Diagnosis not present

## 2012-12-04 DIAGNOSIS — J45909 Unspecified asthma, uncomplicated: Secondary | ICD-10-CM

## 2012-12-04 DIAGNOSIS — R0600 Dyspnea, unspecified: Secondary | ICD-10-CM

## 2012-12-04 NOTE — Progress Notes (Signed)
Subjective:    Patient ID: Michaela Little, female    DOB: Sep 28, 1960 .   MRN: [id number]    Brief patient profile:  52  yobf quit smoking 1990 with asthma dx  around age 52 worse symptoms since around 2004 when eval in  [place] moved to [place]  10/2010 and referred to pulmonary clinic by Dr Glade Lloyd 02/08/2011    HPI 02/08/2011 1st pulmonary eval cc doe x 50 ft plus episodes of breathing attacks(Triggers are emotional, smells, uri's )  where can't breath at rest baseline qod albuterol much better when she takes her flovent, assoc with sensation on pnds/ no purulent sputum.  rec Work on hfa, no change rx > f/u in 3 months with pft's planned > did not return  10/28/2012 f/u ov/Tian Davison Walrath re asthma Chief Complaint  Patient presents with  . Follow-up    Last seen 02/08/11. Pt c/o increased SOB. Pt states that Cardiologist is unsiure if SOB is related to asthma or her heart.   doe x one aisle at HT, sob nightly, no longer able to use cpap, on flovent bid and alb last dose > 8 h prior to OV  But "didn't help",  Prominent dry cough to point choking and vomit x over a year. Coughs so hard gets "crampy pains all over chest"  >>pred taper , d/c flovent , rx tramadol   11/13/2012 followup and medication review. Patient returns for a two-week followup and medication review. We reviewed all her medications and organized them into a medication calendar with patient education. Patient was having difficulty with persistent cough. Last visit. Was given a prednisone taper. She was recommended to discontinue her Flovent inhaler and was given tramadol for help with cough control. Patient reports that she is improved, and cough has substantially decreased. She does report that she was unable to tolerate tramadol .  We reviewed all her medications and organized them medication calendar with patient education.  She denies any hemoptysis, orthopnea, PND, or leg swelling  Spirometry last ov with minimal airflow obstruction  noted. , Flovent was stopped.    12/04/2012 f/u ov/Ercilia Bettinger re  Chief Complaint  Patient presents with  . Follow-up    Pt states breathing is unchanged, no new co's today.       Current Medications, Allergies, Past Medical History, Past Surgical History, Family History, and Social History were reviewed in [employer] record.  ROS  The following are not active complaints unless bolded sore throat, dysphagia, dental problems, itching, sneezing,  nasal congestion or excess/ purulent secretions, ear ache,   fever, chills, sweats, unintended wt loss, pleuritic or exertional cp, hemoptysis,  orthopnea pnd or leg swelling, presyncope, palpitations, heartburn  anorexia, nausea, vomiting, diarrhea  or change in bowel or urinary habits, change in stools or urine, dysuria,hematuria,  rash, arthralgias, visual complaints, headache, numbness weakness or ataxia or problems with walking or coordination,  change in mood/affect or memory.             Objective:   Physical Exam   Obese bf quite  Wt 285 02/08/2011 >  260 10/28/2012 >259 11/13/2012  > 262  12/04/2012  HEENT: nl dentition, turbinates, and orophanx. Nl external ear canals without cough reflex   NECK :  without JVD/Nodes/TM/ nl carotid upstrokes bilaterally   LUNGS: no acc muscle use, clear to A and P bilaterally without cough on insp or exp maneuvers   CV:  RRR  no s3 or murmur or increase in P2,  no edema   ABD:  soft and nontender with nl excursion in the supine position. No bruits or organomegaly, bowel sounds nl  MS:  warm without deformities, calf tenderness, cyanosis or clubbing  SKIN: warm and dry without lesions    NEURO:  alert, approp, no deficits     Cta chest 10/01/12 No evidence of pulmonary embolus. No acute findings.   Assessment & Plan:

## 2012-12-04 NOTE — Patient Instructions (Addendum)
Only use your albuterol (xopenex is one form we gave you today)  as a rescue medication to be used if you can't catch your breath by resting or doing a relaxed purse lip breathing pattern. The less you use it, the better it will work when you need it. Ok to use it up to every 4 hours and don't leave home without it.   Work on inhaler technique:  relax and gently blow all the way out then take a nice smooth deep breath back in, triggering the inhaler at same time you start breathing in.  Hold for up to 5 seconds if you can.  Rinse and gargle with water when done  See calendar for specific medication instructions and bring it back for each and every office visit for every healthcare provider you see.  Without it,  you may not receive the best quality medical care that we feel you deserve.  You will note that the calendar groups together  your maintenance  medications that are timed at particular times of the day.  Think of this as your checklist for what your doctor has instructed you to do until your next evaluation to see what benefit  there is  to staying on a consistent group of medications intended to keep you well.  The other group at the bottom is entirely up to you to use as you see fit  for specific symptoms that may arise between visits that require you to treat them on an as needed basis.  Think of this as your action plan or "what if" list.   Separating the top medications from the bottom group is fundamental to providing you adequate care going forward.    Please schedule a follow up visit in 3 months but call sooner if needed  With full pft's on return

## 2012-12-05 NOTE — Assessment & Plan Note (Signed)
-   Spirometry 10/28/12 > min airflow obstruction  -med calendar 11/13/2012   Improved symptoms off flovent s flare of sob/ wheezing strongly suggest this is not chronic asthma, so for now would like her just to use prn saba  The proper method of use, as well as anticipated side effects, of a metered-dose inhaler are discussed and demonstrated to the patient. Improved effectiveness after extensive coaching during this visit to a level of approximately  75% from a baseline of nearly zero further supporting pseudoasthma as the working dx     Each maintenance medication was reviewed in detail including most importantly the difference between maintenance and as needed and under what circumstances the prns are to be used. This was done in the context of a medication calendar review which provided the patient with a user-friendly unambiguous mechanism for medication administration and reconciliation and provides an action plan for all active problems. It is critical that this be shown to every doctor  for modification during the office visit if necessary so the patient can use it as a working document.

## 2012-12-05 NOTE — Assessment & Plan Note (Signed)
-   10/28/2012   Walked RA x one lap @ 185 stopped due to sob/ prominent pseudowheeze  No desat   Most likely due to obesity/ deconditioning or pseudoasthma/ LPR/ vcd so rec continue max gerd rx, use saba only prn

## 2013-01-05 ENCOUNTER — Telehealth: Payer: Self-pay | Admitting: *Deleted

## 2013-01-05 NOTE — Telephone Encounter (Signed)
Pt called stating that she out [place] until Dec. And wanted to cancel her appts for 01/06/13 and to rs for Dec. gv appt for 03/05/13 w/labs@ 11:390am and ov@ 12. Pt is aware...td

## 2013-01-06 ENCOUNTER — Other Ambulatory Visit: Payer: Medicare Other | Admitting: Lab

## 2013-01-06 ENCOUNTER — Ambulatory Visit: Payer: Medicare Other | Admitting: Oncology

## 2013-02-13 DIAGNOSIS — R1011 Right upper quadrant pain: Secondary | ICD-10-CM | POA: Diagnosis not present

## 2013-02-13 DIAGNOSIS — C50919 Malignant neoplasm of unspecified site of unspecified female breast: Secondary | ICD-10-CM | POA: Diagnosis not present

## 2013-02-13 DIAGNOSIS — Z9089 Acquired absence of other organs: Secondary | ICD-10-CM | POA: Diagnosis not present

## 2013-02-13 DIAGNOSIS — Z87891 Personal history of nicotine dependence: Secondary | ICD-10-CM | POA: Diagnosis not present

## 2013-02-13 DIAGNOSIS — Z853 Personal history of malignant neoplasm of breast: Secondary | ICD-10-CM | POA: Diagnosis not present

## 2013-02-13 DIAGNOSIS — D259 Leiomyoma of uterus, unspecified: Secondary | ICD-10-CM | POA: Diagnosis not present

## 2013-02-13 DIAGNOSIS — K529 Noninfective gastroenteritis and colitis, unspecified: Secondary | ICD-10-CM | POA: Diagnosis not present

## 2013-02-13 DIAGNOSIS — K649 Unspecified hemorrhoids: Secondary | ICD-10-CM | POA: Diagnosis present

## 2013-02-13 DIAGNOSIS — R112 Nausea with vomiting, unspecified: Secondary | ICD-10-CM | POA: Diagnosis not present

## 2013-02-13 DIAGNOSIS — G8929 Other chronic pain: Secondary | ICD-10-CM | POA: Diagnosis present

## 2013-02-13 DIAGNOSIS — E785 Hyperlipidemia, unspecified: Secondary | ICD-10-CM | POA: Diagnosis present

## 2013-02-13 DIAGNOSIS — R109 Unspecified abdominal pain: Secondary | ICD-10-CM | POA: Diagnosis not present

## 2013-02-13 DIAGNOSIS — K838 Other specified diseases of biliary tract: Secondary | ICD-10-CM | POA: Diagnosis not present

## 2013-02-13 DIAGNOSIS — A088 Other specified intestinal infections: Secondary | ICD-10-CM | POA: Diagnosis not present

## 2013-02-13 DIAGNOSIS — I10 Essential (primary) hypertension: Secondary | ICD-10-CM | POA: Diagnosis not present

## 2013-02-13 DIAGNOSIS — D215 Benign neoplasm of connective and other soft tissue of pelvis: Secondary | ICD-10-CM | POA: Diagnosis not present

## 2013-02-13 DIAGNOSIS — J45909 Unspecified asthma, uncomplicated: Secondary | ICD-10-CM | POA: Diagnosis not present

## 2013-02-13 DIAGNOSIS — I499 Cardiac arrhythmia, unspecified: Secondary | ICD-10-CM | POA: Diagnosis not present

## 2013-02-13 DIAGNOSIS — G4733 Obstructive sleep apnea (adult) (pediatric): Secondary | ICD-10-CM | POA: Diagnosis present

## 2013-02-13 DIAGNOSIS — R599 Enlarged lymph nodes, unspecified: Secondary | ICD-10-CM | POA: Diagnosis not present

## 2013-02-21 DIAGNOSIS — R109 Unspecified abdominal pain: Secondary | ICD-10-CM | POA: Diagnosis not present

## 2013-02-21 DIAGNOSIS — N390 Urinary tract infection, site not specified: Secondary | ICD-10-CM | POA: Diagnosis not present

## 2013-02-25 LAB — HM PAP SMEAR

## 2013-03-04 ENCOUNTER — Other Ambulatory Visit: Payer: Self-pay | Admitting: Physician Assistant

## 2013-03-04 DIAGNOSIS — Z853 Personal history of malignant neoplasm of breast: Secondary | ICD-10-CM

## 2013-03-05 ENCOUNTER — Other Ambulatory Visit (HOSPITAL_BASED_OUTPATIENT_CLINIC_OR_DEPARTMENT_OTHER): Payer: Medicare Other

## 2013-03-05 ENCOUNTER — Ambulatory Visit (HOSPITAL_BASED_OUTPATIENT_CLINIC_OR_DEPARTMENT_OTHER): Payer: Medicare Other | Admitting: Oncology

## 2013-03-05 ENCOUNTER — Telehealth: Payer: Self-pay | Admitting: *Deleted

## 2013-03-05 VITALS — BP 153/90 | HR 73 | Temp 98.6°F | Resp 18 | Ht 65.0 in | Wt 265.5 lb

## 2013-03-05 DIAGNOSIS — Z170 Estrogen receptor positive status [ER+]: Secondary | ICD-10-CM

## 2013-03-05 DIAGNOSIS — C50912 Malignant neoplasm of unspecified site of left female breast: Secondary | ICD-10-CM

## 2013-03-05 DIAGNOSIS — C50812 Malignant neoplasm of overlapping sites of left female breast: Secondary | ICD-10-CM

## 2013-03-05 DIAGNOSIS — R51 Headache: Secondary | ICD-10-CM | POA: Diagnosis not present

## 2013-03-05 DIAGNOSIS — C50919 Malignant neoplasm of unspecified site of unspecified female breast: Secondary | ICD-10-CM

## 2013-03-05 DIAGNOSIS — Z853 Personal history of malignant neoplasm of breast: Secondary | ICD-10-CM

## 2013-03-05 HISTORY — DX: Estrogen receptor positive status (ER+): Z17.0

## 2013-03-05 HISTORY — DX: Malignant neoplasm of overlapping sites of left female breast: C50.812

## 2013-03-05 LAB — COMPREHENSIVE METABOLIC PANEL (CC13)
ALT: 16 U/L (ref 0–55)
AST: 22 U/L (ref 5–34)
Albumin: 3.7 g/dL (ref 3.5–5.0)
Alkaline Phosphatase: 133 U/L (ref 40–150)
BUN: 12 mg/dL (ref 7.0–26.0)
CO2: 27 mEq/L (ref 22–29)
Calcium: 9.5 mg/dL (ref 8.4–10.4)
Chloride: 107 mEq/L (ref 98–109)
Creatinine: 0.7 mg/dL (ref 0.6–1.1)
Potassium: 4.3 mEq/L (ref 3.5–5.1)

## 2013-03-05 LAB — CBC WITH DIFFERENTIAL/PLATELET
BASO%: 0.7 % (ref 0.0–2.0)
Basophils Absolute: 0 10*3/uL (ref 0.0–0.1)
EOS%: 3.9 % (ref 0.0–7.0)
HCT: 43 % (ref 34.8–46.6)
HGB: 14 g/dL (ref 11.6–15.9)
MCH: 27.6 pg (ref 25.1–34.0)
MCHC: 32.6 g/dL (ref 31.5–36.0)
RDW: 13.9 % (ref 11.2–14.5)
lymph#: 1.6 10*3/uL (ref 0.9–3.3)

## 2013-03-05 NOTE — Progress Notes (Signed)
ID: [name]   DOB: [birth year]  MR#: [id number]  NWG#:[id number]  PCP: Karna Dupes SU:  Axel Filler GYN: Sabas Sous, MD OTHER MD: Etter Sjogren, Stan Head, Sandrea Hughs  HISTORY OF PRESENT ILLNESS:  Michaela Little is a [place] woman who moved to [facility] in 2013 with a history of breast cancer, and establishied herself with our practice.  The patient had left breast and left axillary lymph node biopsy performed March of 2010, both positive (I do not have the pathology report at this point). She was treated neo-adjuvantly at [facility] with (a) paclitaxel weekly x12 and (b) doxorubicin/cyclophosphamide in dose dense fashion x4, both given with tifiparnib.   She then proceeded to left modified radical mastectomy 12/23/2008, the final pathology showing a 4 cm residual invasive lobular carcinoma, involving 5/22 lymph nodes sampled. The tumor was estrogen and progesterone receptor positive, HER-2 negative.   Postoperatively she received 50 gray of radiation completed January of 2011, including the left supraclavicular lymph node basin. She was then started on tamoxifen, but developed some uterine lining thickening and was switched to anastrozole in May 2012. Her subsequent history is as detailed below  INTERVAL HISTORY: Michaela Little returns today for followup of her breast cancer. Since her last visit here she continued to have problems with pelvic pain. She was evaluated by gynecology at [facility] and she understands them to have concluded that this was ALT musculoskeletal. She also had a urinary tract infection which was treated.   REVIEW OF SYSTEMS: Nevertheless she still has pelvic discomfort. This is pretty much constant, and mild to moderate. It doesn't radiate to her back. She has a bit of a sore throat. She keeps a dry cough. She is short of breath with activity although she does witnessing almost every day if the weather is not bad. She has bilateral knee pain. She has  occasional nausea and sometimes even vomits. Her right breast is tender sometimes. She has back and joint pain which is not more intense or frequent than before. Her main concern though is swelling around her neck and particularly a not she has found in her left neck she wanted me to feel today. A detailed review of systems was otherwise stable and in particular she is tolerating the anastrozole without significant side effects   PAST MEDICAL HISTORY: Past Medical History  Diagnosis Date  . Uterine fibroid   . Hyperlipidemia   . Hypertension   . Asthma   . SVD (spontaneous vaginal delivery)     x 5  . Seasonal allergies   . Arthritis     hands, knees  . History of pneumonia   . Anginal pain     BEEN GOING ON FOR COUPLE MO  . Anxiety   . Shortness of breath     occasional WITH EXERTIO N  . OSA (obstructive sleep apnea)     CPAP  , NOT USING ON REGULAR BASIS  . Tuberculosis     AS CHILD MED TREATMENT   . Heart murmur     AS CHILD   . Headache(784.0)     otc med prn  . Breast cancer 11/2008    left  . Pneumonia     hx  . Dysuria   . Spasm of muscle   . Unspecified vitamin D deficiency   . Other abnormal blood chemistry   . Dysplasia of cervix, unspecified     PAST SURGICAL HISTORY: Past Surgical History  Procedure Laterality Date  . Eye surgery  1961    right  . Cervical conization w/bx      done in [place]  . Colonoscopy  2011    normal per patient - [place]  . Dilation and curettage of uterus  10/15/2011    Procedure: DILATATION AND CURETTAGE;  Surgeon: Bing Plume, MD;  Location: [facility] ORS;  Service: Gynecology;  Laterality: N/A;  Conization  . Cholecystectomy N/A 05/14/2012    Procedure: LAPAROSCOPIC CHOLECYSTECTOMY;  Surgeon: Axel Filler, MD;  Location: [facility] OR;  Service: General;  Laterality: N/A;  . Breast surgery  11/2008     done in [place] - breast cancer  . Mastectomy  11/2008    done in [place], left    FAMILY HISTORY Family History  Problem Relation Age of Onset  .  Breast cancer Mother   . Colon cancer Mother   . Hypotension Mother   . Asthma Mother   . Diabetes type II Mother   . Arthritis Mother   . Clotting disorder Mother   . Cancer Mother     breast/colon  . Mental illness Brother   . Heart disease Brother   . Cerebral palsy Daughter   . Emphysema Brother     never smoker  . Colon cancer Maternal Aunt   . Cancer Maternal Aunt     colon  . Colon cancer Maternal Uncle   . Cancer Maternal Uncle     colon   Gynecologic history: She is GX P5. At first pregnancy to term age 82. Menarche age 70. Last menstrual period when she had her chemotherapy.   Social History: She is disabled secondary to arthritis. She moved from [place] to [place] for "peace of mind". Of her 5 children only one daughter is living in [place], [place]. One daughter in [place] is incarcerated and another one is "in a lot of stress". The patient lives alone. She mostly reads and does computer games on her phone during the day. She has 20 grandchildren.    ADVANCED DIRECTIVES: She does not have a healthcare power of attorney.    HEALTH MAINTENANCE: History  Substance Use Topics  . Smoking status: Former Smoker -- 0.30 packs/day for 10 years    Types: Cigarettes    Quit date: 03/26/1988  . Smokeless tobacco: Never Used  . Alcohol Use: No     Colonoscopy:  PAP:  Bone density:  Lipid panel:  Allergies  Allergen Reactions  . Calcium Carbonate Antacid Hives    Fruit flavored Tums   . Hctz [Hydrochlorothiazide] Itching  . Lisinopril Itching    Current Outpatient Prescriptions  Medication Sig Dispense Refill  . amLODipine (NORVASC) 10 MG tablet Take 10 mg by mouth daily.      Marland Kitchen anastrozole (ARIMIDEX) 1 MG tablet TAKE 1 TABLET EVERY DAY  90 tablet  1  . Calcium-Vitamin D 600-200 MG-UNIT per tablet Take 2 tablets by mouth every morning.      . cyclobenzaprine (FLEXERIL) 10 MG tablet Take 10 mg by mouth every 8 (eight) hours as needed for muscle  spasms.      Marland Kitchen dextromethorphan (DELSYM) 30 MG/5ML liquid 2 tsp every 12 hours as needed for cough      . famotidine (PEPCID) 20 MG tablet One at bedtime  30 tablet  11  . loratadine (CLARITIN) 10 MG tablet TAKE 1 TABLET BY MOUTH ONCE DAILY FOR ALLERGIES  30 tablet  3  . omeprazole (PRILOSEC) 20 MG capsule Take 30- 60 min before your first and  last meals of the day      . polyethylene glycol (MIRALAX / GLYCOLAX) packet Take 17 g by mouth daily as needed (for constipation).        No current facility-administered medications for this visit.    OBJECTIVE: Middle-aged [place] American woman who appears chronically fatigued  There were no vitals filed for this visit.   There is no weight on file to calculate BMI.    ECOG FS: 1 There were no vitals filed for this visit. There were no vitals filed for this visit.  Sclerae unicteric, pupils equal and round Oropharynx clear, no thrush or other lesions The neck is full and I think mostly what she is feeling is fatty tissue. However in the left posterior cervical triangle there is a two thirds of a centimeter hard fixed knot that does require evaluation. Lungs no rales or rhonchi Heart regular rate and rhythm, no murmur appreciated Abdomen obese, positive bowel sounds; a well-healed laparoscopic cholecystectomy scars MSK no focal spinal tenderness Neuro: nonfocal, well oriented, pleasant affect Breasts: Right breast is unremarkable. Patient is status post left mastectomy with expander in place. The skin is tight. There is no evidence of disease recurrence. Axillae are benign bilaterally     LAB RESULTS: Lab Results  Component Value Date   WBC 5.2 03/05/2013   NEUTROABS 3.0 03/05/2013   HGB 14.0 03/05/2013   HCT 43.0 03/05/2013   MCV 84.5 03/05/2013   PLT 249 03/05/2013      Chemistry      Component Value Date/Time   NA 142 03/05/2013 1128   NA 138 10/22/2012 1149   K 4.3 03/05/2013 1128   K 3.8 10/22/2012 1149   CL 105 10/22/2012 1149    CL 105 07/07/2012 1327   CO2 27 03/05/2013 1128   CO2 27 10/22/2012 1149   BUN 12.0 03/05/2013 1128   BUN 10 10/22/2012 1149   CREATININE 0.7 03/05/2013 1128   CREATININE 0.7 10/22/2012 1149      Component Value Date/Time   CALCIUM 9.5 03/05/2013 1128   CALCIUM 9.1 10/22/2012 1149   ALKPHOS 133 03/05/2013 1128   ALKPHOS 117 10/01/2012 1240   AST 22 03/05/2013 1128   AST 18 10/01/2012 1240   ALT 16 03/05/2013 1128   ALT 10 10/01/2012 1240   BILITOT 0.89 03/05/2013 1128   BILITOT 0.8 10/01/2012 1240       Lab Results  Component Value Date   LABCA2 19 01/17/2012     STUDIES: Right mammography at [facility] 07/10/2012 was unremarkable. Bone density scan at [facility] 08/06/2012 was normal, with a T score of 0.6   52 y.o.  [place] woman relocated here from [place],    (1)  left-sided breast cancer diagnosed March of 2010 and treated neo-adjuvantly at [facility] with (a) paclitaxel weekly x12 and (b) doxorubicin/ cyclophosphamide in dose dense fashion x4, both given with tifiparnib.   (2) definitive left modified radical mastectomy September of 2010 for a T2 N2 or stage IIIA invasive lobular breast cancer, estrogen and progesterone receptor positive, HER-2 negative,   (3)  post mastectomy radiation completed January of 2011,  (4) briefly on tamoxifen, discontinued it because of uterine lining concerns.  On anastrozole since May 2012 with good tolerance. Held between November and December 2013 due to GI issues, unrelated to cancer or its treatment.  (5)  Other problems include left upper extremity lymphedema, borderline concern regarding genetic family history, history of cervical dysplasia, and history of dense breasts.  PLAN: Daisia is tolerating the anastrozole without obvious side effects. She has an excellent bone density. Accordingly the plan is to continue anastrozole on until she completes 5 years. I am not sure what the cause is of her groin pain, but she was evaluated at [facility] and  that's felt to be musculoskeletal. She was also evaluated for a urinary tract infection and that was treated.  I don't know what to make of her headaches, but she does have a very small hard knot in the left posterior cervical triangle. I am going to obtain a CT of the brain and neck for evaluation. Otherwise she will see [place] again in 6 months. She knows to call for any problems that may develop before the next visit.    MAGRINAT,GUSTAV C    03/05/2013

## 2013-03-05 NOTE — Telephone Encounter (Signed)
appts made and printed. Pt is aware that cs will call with appts for CT HEAD/ NECK...td

## 2013-03-12 ENCOUNTER — Encounter (HOSPITAL_COMMUNITY): Payer: Self-pay

## 2013-03-12 ENCOUNTER — Ambulatory Visit (HOSPITAL_COMMUNITY)
Admission: RE | Admit: 2013-03-12 | Discharge: 2013-03-12 | Disposition: A | Discharge status: Home / Self Care | Payer: Medicare Other | Source: Ambulatory Visit | Attending: Oncology | Admitting: Oncology

## 2013-03-12 DIAGNOSIS — R51 Headache: Secondary | ICD-10-CM | POA: Diagnosis not present

## 2013-03-12 DIAGNOSIS — Z9221 Personal history of antineoplastic chemotherapy: Secondary | ICD-10-CM | POA: Insufficient documentation

## 2013-03-12 DIAGNOSIS — C50919 Malignant neoplasm of unspecified site of unspecified female breast: Secondary | ICD-10-CM

## 2013-03-12 DIAGNOSIS — M47812 Spondylosis without myelopathy or radiculopathy, cervical region: Secondary | ICD-10-CM | POA: Diagnosis not present

## 2013-03-12 DIAGNOSIS — R599 Enlarged lymph nodes, unspecified: Secondary | ICD-10-CM | POA: Diagnosis not present

## 2013-03-12 DIAGNOSIS — E041 Nontoxic single thyroid nodule: Secondary | ICD-10-CM | POA: Insufficient documentation

## 2013-03-12 DIAGNOSIS — R220 Localized swelling, mass and lump, head: Secondary | ICD-10-CM | POA: Diagnosis not present

## 2013-03-12 DIAGNOSIS — J3489 Other specified disorders of nose and nasal sinuses: Secondary | ICD-10-CM | POA: Insufficient documentation

## 2013-03-12 DIAGNOSIS — Z853 Personal history of malignant neoplasm of breast: Secondary | ICD-10-CM | POA: Insufficient documentation

## 2013-03-12 DIAGNOSIS — Z923 Personal history of irradiation: Secondary | ICD-10-CM | POA: Insufficient documentation

## 2013-03-12 DIAGNOSIS — H538 Other visual disturbances: Secondary | ICD-10-CM | POA: Insufficient documentation

## 2013-03-12 MED ORDER — IOHEXOL 300 MG/ML  SOLN
100.0000 mL | Freq: Once | INTRAMUSCULAR | Status: AC | PRN
  Administered 2013-03-12: 100 mL via INTRAVENOUS

## 2013-03-13 ENCOUNTER — Telehealth: Payer: Self-pay | Admitting: *Deleted

## 2013-03-13 ENCOUNTER — Other Ambulatory Visit: Payer: Self-pay | Admitting: Oncology

## 2013-03-13 NOTE — Telephone Encounter (Signed)
Patient called to get results of CT scan done last week. Informed patient that there are no signs of metastatic disease. Patient became very upset and started crying, states that she has "something wrong and no one can find it".  They just need to do some biopsies. There is a reason I have swelling, there is a reason I get sick and throw up. After listening to patient for approx discuss her symptoms and testing that she has had, I tried to reassure that again, this CT does not warrant any biopsy at this time.  Patient has appt with Dr Glade Lloyd on 12/31, encouraged her to share the CT with her and see if any of her underlying pulmonology issues could be contributing to her complaints. Also encouraged patient to see her OB-GYN for complaints of pain with presence of fibroids. Patient appreciative for my time, and knows to call back if her situation continues to worsen and we can see her sooner if needed.

## 2013-03-20 DIAGNOSIS — M542 Cervicalgia: Secondary | ICD-10-CM | POA: Diagnosis not present

## 2013-03-25 ENCOUNTER — Ambulatory Visit (INDEPENDENT_AMBULATORY_CARE_PROVIDER_SITE_OTHER): Payer: Medicare Other | Admitting: Internal Medicine

## 2013-03-25 ENCOUNTER — Encounter: Payer: Self-pay | Admitting: Internal Medicine

## 2013-03-25 VITALS — BP 130/78 | HR 73 | Temp 98.1°F | Ht 66.75 in | Wt 271.0 lb

## 2013-03-25 DIAGNOSIS — Z1211 Encounter for screening for malignant neoplasm of colon: Secondary | ICD-10-CM

## 2013-03-25 DIAGNOSIS — Z23 Encounter for immunization: Secondary | ICD-10-CM | POA: Diagnosis not present

## 2013-03-25 DIAGNOSIS — K5900 Constipation, unspecified: Secondary | ICD-10-CM

## 2013-03-25 DIAGNOSIS — I10 Essential (primary) hypertension: Secondary | ICD-10-CM

## 2013-03-25 DIAGNOSIS — R300 Dysuria: Secondary | ICD-10-CM | POA: Diagnosis not present

## 2013-03-25 DIAGNOSIS — E669 Obesity, unspecified: Secondary | ICD-10-CM | POA: Diagnosis not present

## 2013-03-25 DIAGNOSIS — E785 Hyperlipidemia, unspecified: Secondary | ICD-10-CM | POA: Diagnosis not present

## 2013-03-25 DIAGNOSIS — K219 Gastro-esophageal reflux disease without esophagitis: Secondary | ICD-10-CM | POA: Insufficient documentation

## 2013-03-25 DIAGNOSIS — Z853 Personal history of malignant neoplasm of breast: Secondary | ICD-10-CM | POA: Diagnosis not present

## 2013-03-25 DIAGNOSIS — Z0000 Encounter for general adult medical examination without abnormal findings: Secondary | ICD-10-CM

## 2013-03-25 LAB — POCT URINALYSIS DIPSTICK
Bilirubin, UA: NEGATIVE
Glucose, UA: NEGATIVE
Ketones, UA: NEGATIVE
Protein, UA: NEGATIVE
Spec Grav, UA: 1.025
Urobilinogen, UA: 0.2
pH, UA: 5

## 2013-03-25 MED ORDER — AMLODIPINE BESYLATE 10 MG PO TABS: 10.0000 mg | ORAL_TABLET | Freq: Every day | ORAL | Status: DC

## 2013-03-25 MED ORDER — FLAVOXATE HCL 100 MG PO TABS: 100.0000 mg | ORAL_TABLET | Freq: Three times a day (TID) | ORAL | Status: DC | PRN

## 2013-03-25 MED ORDER — TETANUS-DIPHTH-ACELL PERTUSSIS 5-2.5-18.5 LF-MCG/0.5 IM SUSP: 0.5000 mL | Freq: Once | INTRAMUSCULAR | Status: DC

## 2013-03-25 MED ORDER — PROMETHAZINE HCL 25 MG PO TABS: 25.0000 mg | ORAL_TABLET | Freq: Four times a day (QID) | ORAL | Status: DC | PRN

## 2013-03-25 MED ORDER — SULFAMETHOXAZOLE-TMP DS 800-160 MG PO TABS: 1.0000 | ORAL_TABLET | Freq: Two times a day (BID) | ORAL | Status: DC

## 2013-03-25 NOTE — Progress Notes (Signed)
Patient ID: Michaela Little, female   DOB: 1960-08-21, 52 y.o.   MRN: [id number]     Chief Complaint  Patient presents with  . Annual Exam    Physical with labs printed  . Immunizations    will print Tdap, and will get the flu vaccine today  . other    knot in her neck x 3 months, and pelvic pain-ongoing   Allergies  Allergen Reactions  . Calcium Carbonate Antacid Hives    Fruit flavored Tums   . Hctz [Hydrochlorothiazide] Itching  . Lisinopril Itching    HPI 52 y/o female patient is here for her physical. She has not received her flu vaccine and tdap vaccine. She follows with heme-onc services for her breast cancer and is taking anastrozole. Her reflux is under control. Her bp is well controlled. She is not exercising at present. Her pelvic pain is ongoing. She has been seen at [facility] and [facility] and after workup thought to be musculoskeltal. She would like to obtain 3rd opinion. She also feels a knot on her left side of the neck which is painful. She has seen ENT for this and thought to be musculoskeletal uptodate with her breast exam, mammogram, dexa scan and pap smear No other complaints She has history of breast cancer, chronic abdominal and pelvic pain, HTN   Review of Systems  Constitutional: Negative for fever, chills, appetite change and fatigue.  HENT: Negative for congestion and mouth sores.   Eyes: Negative for visual disturbance.  Respiratory: Negative for chest tightness and shortness of breath.   Cardiovascular: Negative for chest pain, palpitations and leg swelling.  Gastrointestinal: Negative for nausea, abdominal pain, diarrhea, constipation, vomiting.  Genitourinary: Positive for pelvic pain. Negative for dysuria, frequency and vaginal bleeding.  Musculoskeletal: Positive for arthralgias.  Skin: Negative for rash.  Neurological: Negative for dizziness, syncope and weakness.  Hematological: Negative for adenopathy.  Psychiatric/Behavioral:  Negative for suicidal ideas, confusion, dysphoric mood and agitation. The patient is not nervous/anxious.    Past Medical History  Diagnosis Date  . Uterine fibroid   . Hyperlipidemia   . Hypertension   . Asthma   . SVD (spontaneous vaginal delivery)     x 5  . Seasonal allergies   . Arthritis     hands, knees  . History of pneumonia   . Anginal pain     BEEN GOING ON FOR COUPLE MO  . Anxiety   . Shortness of breath     occasional WITH EXERTIO N  . OSA (obstructive sleep apnea)     CPAP  , NOT USING ON REGULAR BASIS  . Tuberculosis     AS CHILD MED TREATMENT   . Heart murmur     AS CHILD   . Headache(784.0)     otc med prn  . Breast cancer 11/2008    left  . Pneumonia     hx  . Dysuria   . Spasm of muscle   . Unspecified vitamin D deficiency   . Other abnormal blood chemistry   . Dysplasia of cervix, unspecified    Past Surgical History  Procedure Laterality Date  . Eye surgery  1961    right  . Cervical conization w/bx      done in [place]  . Colonoscopy  2011    normal per patient - [place]  . Dilation and curettage of uterus  10/15/2011    Procedure: DILATATION AND CURETTAGE;  Surgeon: Bing Plume, MD;  Location: [facility] ORS;  Service: Gynecology;  Laterality: N/A;  Conization  . Cholecystectomy N/A 05/14/2012    Procedure: LAPAROSCOPIC CHOLECYSTECTOMY;  Surgeon: Axel Filler, MD;  Location: [facility] OR;  Service: General;  Laterality: N/A;  . Breast surgery  11/2008     done in [place] - breast cancer  . Mastectomy  11/2008    done in [place], left   Medication reviewed. See MAR  History   Social History  . Marital Status: Single    Spouse Name: N/A    Number of Children: 5  . Years of Education: N/A   Occupational History  . Disabled    Social History Main Topics  . Smoking status: Former Smoker -- 0.30 packs/day for 10 years    Types: Cigarettes    Quit date: 03/26/1988  . Smokeless tobacco: Never Used  . Alcohol Use: No  . Drug Use: No  . Sexual Activity: Not  Currently   Other Topics Concern  . Not on file   Social History Narrative  . No narrative on file   Family History  Problem Relation Age of Onset  . Breast cancer Mother   . Colon cancer Mother   . Hypotension Mother   . Asthma Mother   . Diabetes type II Mother   . Arthritis Mother   . Clotting disorder Mother   . Cancer Mother     breast/colon  . Mental illness Brother   . Heart disease Brother   . Cerebral palsy Daughter   . Emphysema Brother     never smoker  . Colon cancer Maternal Aunt   . Cancer Maternal Aunt     colon  . Colon cancer Maternal Uncle   . Cancer Maternal Uncle     colon    Physical exam BP 130/78  Pulse 73  Temp(Src) 98.1 F (36.7 C) (Oral)  Ht 5' 6.75" (1.695 m)  Wt 271 lb (122.925 kg)  BMI 42.79 kg/m2  SpO2 98%  LMP 02/25/2009  General- adult obese female in no acute distress Head- atraumatic, normocephalic Eyes- PERRLA, EOMI, no pallor, no icterus, no discharge Neck- no lymphadenopathy, no thyromegaly, no jugular vein distension, no carotid bruit, no palpable lumps/ knots Ears- left ear normal tympanic membrane and normal external ear canal , right ear normal tympanic membrane and normal external ear canal Chest- no chest wall deformities, no chest wall tenderness Breast- no masses, no palpable lumps, left breast post mastectomy  Cardiovascular- normal s1,s2, no murmurs/ rubs/ gallops Respiratory- bilateral clear to auscultation, no wheeze, no rhonchi, no crackles Abdomen- bowel sounds present, soft, non tender, no organomegaly, no abdominal bruits, no guarding or rigidity, no CVA tenderness Pelvic exam-done at Gyn Musculoskeletal- able to move all 4 extremities, no spinal and paraspinal tenderness, steady gait, no use of assistive device Neurological- no focal deficit, normal reflexes, normal muscle strength, normal sensation to fine touch and vibration Psychiatry- alert and oriented to person, place and time, normal mood and  affect  Labs- CBC    Component Value Date/Time   WBC 5.2 03/05/2013 1128   WBC 4.0 10/01/2012 1240   RBC 5.10 03/05/2013 1128   RBC 5.17* 10/01/2012 1240   HGB 14.0 03/05/2013 1128   HGB 13.9 10/01/2012 1240   HCT 43.0 03/05/2013 1128   HCT 42.4 10/01/2012 1240   PLT 249 03/05/2013 1128   PLT 238 10/01/2012 1240   MCV 84.5 03/05/2013 1128   MCV 82.0 10/01/2012 1240   MCH 27.6 03/05/2013 1128  MCH 26.9 10/01/2012 1240   MCHC 32.6 03/05/2013 1128   MCHC 32.8 10/01/2012 1240   RDW 13.9 03/05/2013 1128   RDW 13.5 10/01/2012 1240   LYMPHSABS 1.6 03/05/2013 1128   LYMPHSABS 1.4 10/01/2012 1240   MONOABS 0.4 03/05/2013 1128   MONOABS 0.3 10/01/2012 1240   EOSABS 0.2 03/05/2013 1128   EOSABS 0.1 10/01/2012 1240   BASOSABS 0.0 03/05/2013 1128   BASOSABS 0.0 10/01/2012 1240    CMP     Component Value Date/Time   NA 142 03/05/2013 1128   NA 138 10/22/2012 1149   K 4.3 03/05/2013 1128   K 3.8 10/22/2012 1149   CL 105 10/22/2012 1149   CL 105 07/07/2012 1327   CO2 27 03/05/2013 1128   CO2 27 10/22/2012 1149   GLUCOSE 107 03/05/2013 1128   GLUCOSE 114* 10/22/2012 1149   GLUCOSE 104* 07/07/2012 1327   BUN 12.0 03/05/2013 1128   BUN 10 10/22/2012 1149   CREATININE 0.7 03/05/2013 1128   CREATININE 0.7 10/22/2012 1149   CALCIUM 9.5 03/05/2013 1128   CALCIUM 9.1 10/22/2012 1149   PROT 7.7 03/05/2013 1128   PROT 7.4 10/01/2012 1240   ALBUMIN 3.7 03/05/2013 1128   ALBUMIN 3.6 10/01/2012 1240   AST 22 03/05/2013 1128   AST 18 10/01/2012 1240   ALT 16 03/05/2013 1128   ALT 10 10/01/2012 1240   ALKPHOS 133 03/05/2013 1128   ALKPHOS 117 10/01/2012 1240   BILITOT 0.89 03/05/2013 1128   BILITOT 0.8 10/01/2012 1240   GFRNONAA >90 10/01/2012 1240   GFRAA >90 10/01/2012 1240   No results found for this basename: HGBA1C    Lipid Panel     Component Value Date/Time   CHOL 195 03/05/2011 0435   TRIG 60 03/05/2011 0435   HDL 48 03/05/2011 0435   CHOLHDL 4.1 03/05/2011 0435   VLDL 12 03/05/2011 0435   LDLCALC 135*  03/05/2011 0435   Assessment/plan  General exam- Flu vaccine provided. Script for tdap given to patient. Refills on medication provided. Gi referral for screening colonoscopy given her age, hx of breast cancer and her chronic constipation. Continue calcium and vitamin d supplement.   Hypertension- bp stable. Will continue her amlodipine  Hyperlipidemia- recheck flp today  GERD- continue famotidine and omeprazole. Dietary control, small portion and avoiding late meal encouraged  Constipation- continue with her miralax  Dysuria- dirty u/a, send for culture. Likely has UTI. Encouraged hydration. Will treat her empirically with bactrim ds 1 tab q12h for a week and follow culture report. Will also provide flavoxate to help with bladder discomfort. Monitor for fever or worsening of symptoms  Of note- reviewed CT head and neck scheduled by heme-onc. Negative for metastases. Small LN seen on CT scan but unable to palpate on today's exam

## 2013-03-26 LAB — LIPID PANEL
Chol/HDL Ratio: 3.7 ratio units (ref 0.0–4.4)
Cholesterol, Total: 216 mg/dL — ABNORMAL HIGH (ref 100–199)
HDL: 58 mg/dL (ref >39)
LDL Calculated: 145 mg/dL — ABNORMAL HIGH (ref 0–99)
Triglycerides: 64 mg/dL (ref 0–149)
VLDL Cholesterol Cal: 13 mg/dL (ref 5–40)

## 2013-03-27 LAB — URINE CULTURE: Organism ID, Bacteria: NO GROWTH

## 2013-04-01 ENCOUNTER — Telehealth: Payer: Self-pay | Admitting: *Deleted

## 2013-04-01 MED ORDER — SOLIFENACIN SUCCINATE 5 MG PO TABS: 5.0000 mg | ORAL_TABLET | Freq: Every day | ORAL | Status: DC

## 2013-04-01 NOTE — Telephone Encounter (Signed)
Per Dr Bubba Camp, rx for Vesicare sent to pharmacy and pt will f/u in 4 weeks.

## 2013-04-02 ENCOUNTER — Ambulatory Visit: Payer: Medicare Other | Attending: Obstetrics and Gynecology | Admitting: Physical Therapy

## 2013-04-02 DIAGNOSIS — M2420 Disorder of ligament, unspecified site: Secondary | ICD-10-CM | POA: Insufficient documentation

## 2013-04-02 DIAGNOSIS — Z853 Personal history of malignant neoplasm of breast: Secondary | ICD-10-CM | POA: Insufficient documentation

## 2013-04-02 DIAGNOSIS — I10 Essential (primary) hypertension: Secondary | ICD-10-CM | POA: Diagnosis not present

## 2013-04-02 DIAGNOSIS — IMO0001 Reserved for inherently not codable concepts without codable children: Secondary | ICD-10-CM | POA: Diagnosis not present

## 2013-04-02 DIAGNOSIS — M25559 Pain in unspecified hip: Secondary | ICD-10-CM | POA: Insufficient documentation

## 2013-04-02 DIAGNOSIS — M545 Low back pain, unspecified: Secondary | ICD-10-CM | POA: Diagnosis not present

## 2013-04-02 DIAGNOSIS — J45909 Unspecified asthma, uncomplicated: Secondary | ICD-10-CM | POA: Diagnosis not present

## 2013-04-02 DIAGNOSIS — M629 Disorder of muscle, unspecified: Secondary | ICD-10-CM | POA: Insufficient documentation

## 2013-04-02 DIAGNOSIS — R5381 Other malaise: Secondary | ICD-10-CM | POA: Insufficient documentation

## 2013-04-02 DIAGNOSIS — D259 Leiomyoma of uterus, unspecified: Secondary | ICD-10-CM | POA: Diagnosis not present

## 2013-04-07 ENCOUNTER — Ambulatory Visit: Payer: Medicare Other | Admitting: Physical Therapy

## 2013-04-07 DIAGNOSIS — IMO0001 Reserved for inherently not codable concepts without codable children: Secondary | ICD-10-CM | POA: Diagnosis not present

## 2013-04-08 ENCOUNTER — Ambulatory Visit (AMBULATORY_SURGERY_CENTER): Payer: Self-pay | Admitting: *Deleted

## 2013-04-08 VITALS — Ht 65.0 in | Wt 268.8 lb

## 2013-04-08 DIAGNOSIS — Z1211 Encounter for screening for malignant neoplasm of colon: Secondary | ICD-10-CM

## 2013-04-08 MED ORDER — NA SULFATE-K SULFATE-MG SULF 17.5-3.13-1.6 GM/177ML PO SOLN: 1.0000 | Freq: Once | ORAL | Status: DC

## 2013-04-08 NOTE — Progress Notes (Signed)
Denies allergies to eggs or soy products. Denies complications with sedation or anesthesia. 

## 2013-04-09 ENCOUNTER — Ambulatory Visit: Payer: Medicare Other | Admitting: Physical Therapy

## 2013-04-09 DIAGNOSIS — IMO0001 Reserved for inherently not codable concepts without codable children: Secondary | ICD-10-CM | POA: Diagnosis not present

## 2013-04-13 ENCOUNTER — Ambulatory Visit: Payer: Medicare Other | Admitting: Physical Therapy

## 2013-04-13 DIAGNOSIS — IMO0001 Reserved for inherently not codable concepts without codable children: Secondary | ICD-10-CM | POA: Diagnosis not present

## 2013-04-15 ENCOUNTER — Ambulatory Visit: Payer: Medicare Other | Admitting: Physical Therapy

## 2013-04-15 DIAGNOSIS — IMO0001 Reserved for inherently not codable concepts without codable children: Secondary | ICD-10-CM | POA: Diagnosis not present

## 2013-04-16 ENCOUNTER — Ambulatory Visit (AMBULATORY_SURGERY_CENTER): Payer: Medicare Other | Admitting: Internal Medicine

## 2013-04-16 ENCOUNTER — Encounter: Payer: Self-pay | Admitting: Internal Medicine

## 2013-04-16 VITALS — BP 144/72 | HR 69 | Temp 97.0°F | Resp 40 | Ht 65.0 in | Wt 268.0 lb

## 2013-04-16 DIAGNOSIS — Z1211 Encounter for screening for malignant neoplasm of colon: Secondary | ICD-10-CM | POA: Diagnosis not present

## 2013-04-16 DIAGNOSIS — J45909 Unspecified asthma, uncomplicated: Secondary | ICD-10-CM | POA: Diagnosis not present

## 2013-04-16 DIAGNOSIS — K219 Gastro-esophageal reflux disease without esophagitis: Secondary | ICD-10-CM | POA: Diagnosis not present

## 2013-04-16 DIAGNOSIS — E669 Obesity, unspecified: Secondary | ICD-10-CM | POA: Diagnosis not present

## 2013-04-16 DIAGNOSIS — I2510 Atherosclerotic heart disease of native coronary artery without angina pectoris: Secondary | ICD-10-CM | POA: Diagnosis not present

## 2013-04-16 DIAGNOSIS — I519 Heart disease, unspecified: Secondary | ICD-10-CM | POA: Diagnosis not present

## 2013-04-16 DIAGNOSIS — G4733 Obstructive sleep apnea (adult) (pediatric): Secondary | ICD-10-CM | POA: Diagnosis not present

## 2013-04-16 MED ORDER — SODIUM CHLORIDE 0.9 % IV SOLN: 500.0000 mL | INTRAVENOUS | Status: DC

## 2013-04-16 NOTE — Patient Instructions (Addendum)
Your colonoscopy was normal!  Next routine colonoscopy in 10 years - 2025  I appreciate the opportunity to care for you. Lumen Brinlee E. Jola Critzer, MD, FACG  Discharge instructions given with verbal understanding. Normal exam. Resume previous medications. YOU HAD AN ENDOSCOPIC PROCEDURE TODAY AT THE [facility]: Refer to the procedure report that was given to you for any specific questions about what was found during the examination.  If the procedure report does not answer your questions, please call your gastroenterologist to clarify.  If you requested that your care partner not be given the details of your procedure findings, then the procedure report has been included in a sealed envelope for you to review at your convenience later.  YOU SHOULD EXPECT: Some feelings of bloating in the abdomen. Passage of more gas than usual.  Walking can help get rid of the air that was put into your GI tract during the procedure and reduce the bloating. If you had a lower endoscopy (such as a colonoscopy or flexible sigmoidoscopy) you may notice spotting of blood in your stool or on the toilet paper. If you underwent a bowel prep for your procedure, then you may not have a normal bowel movement for a few days.  DIET: Your first meal following the procedure should be a light meal and then it is ok to progress to your normal diet.  A half-sandwich or bowl of soup is an example of a good first meal.  Heavy or fried foods are harder to digest and may make you feel nauseous or bloated.  Likewise meals heavy in dairy and vegetables can cause extra gas to form and this can also increase the bloating.  Drink plenty of fluids but you should avoid alcoholic beverages for 24 hours.  ACTIVITY: Your care partner should take you home directly after the procedure.  You should plan to take it easy, moving slowly for the rest of the day.  You can resume normal activity the day after the procedure however you should NOT DRIVE  or use heavy machinery for 24 hours (because of the sedation medicines used during the test).    SYMPTOMS TO REPORT IMMEDIATELY: A gastroenterologist can be reached at any hour.  During normal business hours, 8:30 AM to 5:00 PM Monday through Friday, call [phone].  After hours and on weekends, please call the GI answering service at [phone] who will take a message and have the physician on call contact you.   Following lower endoscopy (colonoscopy or flexible sigmoidoscopy):  Excessive amounts of blood in the stool  Significant tenderness or worsening of abdominal pains  Swelling of the abdomen that is new, acute  Fever of 100F or higher  FOLLOW UP: If any biopsies were taken you will be contacted by phone or by letter within the next 1-3 weeks.  Call your gastroenterologist if you have not heard about the biopsies in 3 weeks.  Our staff will call the home number listed on your records the next business day following your procedure to check on you and address any questions or concerns that you may have at that time regarding the information given to you following your procedure. This is a courtesy call and so if there is no answer at the home number and we have not heard from you through the emergency physician on call, we will assume that you have returned to your regular daily activities without incident.  SIGNATURES/CONFIDENTIALITY: You and/or your care partner have signed paperwork which   which will be entered into your electronic medical record.  These signatures attest to the fact that that the information above on your After Visit Summary has been reviewed and is understood.  Full responsibility of the confidentiality of this discharge information lies with you and/or your care-partner.

## 2013-04-16 NOTE — Progress Notes (Signed)
Report to pacu rn, vss, bbs=clear 

## 2013-04-16 NOTE — Progress Notes (Signed)
Pt states that she a sandwich at 8:30 a.m. Today and then had clears only until 9:30 a.m.  She states she ate chicken wings and stuffing at 2:30 p.m. Yesterday.  She had clears until 9:30 a.m.  She states that she drank her entire Suprep and the results are brown liquid with some sediment.  [facility] CRNA informed and states pt cannot have procedure done until 1630.  Dr. Carlean Purl notified and states he is willing to do her colonoscopy at that time 1630. Pt elects to have procedure done today at 1630- told to not drink clears after 1330 and understanding voiced

## 2013-04-16 NOTE — Op Note (Signed)
[place]  [address] [place], [id number]   COLONOSCOPY PROCEDURE REPORT  PATIENT: Michaela, Little.  MR#: [id number] BIRTHDATE: 18-Jul-1960 , 69  yrs. old GENDER: Female ENDOSCOPIST: Gatha Mayer, MD, [facility] PROCEDURE DATE:  04/16/2013 PROCEDURE:   Colonoscopy, screening First Screening Colonoscopy - Avg.  risk and is 50 yrs.  old or older Yes.  Prior Negative Screening - Now for repeat screening. N/A  History of Adenoma - Now for follow-up colonoscopy & has been > or = to 3 yrs.  N/A  Polyps Removed Today? No.  Recommend repeat exam, <10 yrs? No. ASA CLASS:   Class III INDICATIONS:average risk screening and first colonoscopy. MEDICATIONS: Propofol (Diprivan) 260 mg IV, MAC sedation, administered by CRNA, and These medications were titrated to patient response per physician's verbal order  DESCRIPTION OF PROCEDURE:   After the risks benefits and alternatives of the procedure were thoroughly explained, informed consent was obtained.  A digital rectal exam revealed no rectal mass and A digital rectal exam revealed several skin tags.   The LB [id number]  endoscope was introduced through the anus and advanced to the cecum, which was identified by both the appendix and ileocecal valve. No adverse events experienced.   The quality of the prep was Suprep good  The instrument was then slowly withdrawn as the colon was fully examined.      COLON FINDINGS: A normal appearing cecum, ileocecal valve, and appendiceal orifice were identified.  The ascending, hepatic flexure, transverse, splenic flexure, descending, sigmoid colon and rectum appeared unremarkable.  No polyps or cancers were seen. Retroflexed views revealed no abnormalities. The time to cecum=4 minutes 08 seconds.  Withdrawal time=12 minutes 07 seconds.  The scope was withdrawn and the procedure completed. COMPLICATIONS: There were no complications.  ENDOSCOPIC IMPRESSION: Normal colonoscopy -  good prep  RECOMMENDATIONS: Repeat colonoscopy 10 years - 2025   eSigned:  Gatha Mayer, MD, [facility] 04/16/2013 4:43 PM   cc: The Patient  and Blanchie Serve, MD

## 2013-04-17 ENCOUNTER — Telehealth: Payer: Self-pay

## 2013-04-17 NOTE — Telephone Encounter (Signed)
  Follow up Call-  Call back number 04/16/2013 03/11/2012 03/07/2012  Post procedure Call Back phone  # [phone]  Permission to leave phone message Yes Yes Yes     Patient questions:  Do you have a fever, pain , or abdominal swelling? no Pain Score  0 *  Have you tolerated food without any problems? yes  Have you been able to return to your normal activities? yes  Do you have any questions about your discharge instructions: Diet   no Medications  no Follow up visit  no  Do you have questions or concerns about your Care? no  Actions: * If pain score is 4 or above: No action needed, pain <4.

## 2013-04-21 ENCOUNTER — Ambulatory Visit: Payer: Medicare Other | Admitting: Physical Therapy

## 2013-04-21 DIAGNOSIS — IMO0001 Reserved for inherently not codable concepts without codable children: Secondary | ICD-10-CM | POA: Diagnosis not present

## 2013-04-23 ENCOUNTER — Ambulatory Visit: Payer: Medicare Other | Admitting: Physical Therapy

## 2013-04-23 DIAGNOSIS — IMO0001 Reserved for inherently not codable concepts without codable children: Secondary | ICD-10-CM | POA: Diagnosis not present

## 2013-04-27 ENCOUNTER — Other Ambulatory Visit: Payer: Self-pay | Admitting: Internal Medicine

## 2013-04-27 ENCOUNTER — Other Ambulatory Visit (INDEPENDENT_AMBULATORY_CARE_PROVIDER_SITE_OTHER): Payer: Medicare Other

## 2013-04-27 ENCOUNTER — Ambulatory Visit (INDEPENDENT_AMBULATORY_CARE_PROVIDER_SITE_OTHER): Payer: Medicare Other | Admitting: Internal Medicine

## 2013-04-27 ENCOUNTER — Encounter: Payer: Self-pay | Admitting: Internal Medicine

## 2013-04-27 ENCOUNTER — Ambulatory Visit (INDEPENDENT_AMBULATORY_CARE_PROVIDER_SITE_OTHER)
Admission: RE | Admit: 2013-04-27 | Discharge: 2013-04-27 | Disposition: A | Discharge status: Home / Self Care | Payer: Medicare Other | Source: Ambulatory Visit | Attending: Internal Medicine | Admitting: Internal Medicine

## 2013-04-27 VITALS — BP 104/64 | HR 83 | Temp 98.2°F | Ht 65.5 in | Wt 273.0 lb

## 2013-04-27 DIAGNOSIS — R0600 Dyspnea, unspecified: Secondary | ICD-10-CM

## 2013-04-27 DIAGNOSIS — R0609 Other forms of dyspnea: Secondary | ICD-10-CM

## 2013-04-27 DIAGNOSIS — J45909 Unspecified asthma, uncomplicated: Secondary | ICD-10-CM | POA: Diagnosis not present

## 2013-04-27 DIAGNOSIS — R059 Cough, unspecified: Secondary | ICD-10-CM

## 2013-04-27 DIAGNOSIS — R0989 Other specified symptoms and signs involving the circulatory and respiratory systems: Secondary | ICD-10-CM

## 2013-04-27 DIAGNOSIS — R05 Cough: Secondary | ICD-10-CM

## 2013-04-27 LAB — BASIC METABOLIC PANEL
BUN: 12 mg/dL (ref 6–23)
CALCIUM: 9 mg/dL (ref 8.4–10.5)
CHLORIDE: 105 meq/L (ref 96–112)
CO2: 28 meq/L (ref 19–32)
CREATININE: 0.7 mg/dL (ref 0.4–1.2)
GFR: 111.7 mL/min (ref >60.00)
GLUCOSE: 96 mg/dL (ref 70–99)
Potassium: 4 mEq/L (ref 3.5–5.1)
Sodium: 139 mEq/L (ref 135–145)

## 2013-04-27 LAB — PULMONARY FUNCTION TEST
DL/VA % pred: 117 %
DL/VA: 5.87 ml/min/mmHg/L
DLCO UNC: 18.58 ml/min/mmHg
DLCO unc % pred: 70 %
FEF 25-75 Post: 2.91 L/sec
FEF 25-75 Pre: 2.68 L/sec
FEF2575-%Change-Post: 8 %
FEF2575-%PRED-PRE: 113 %
FEF2575-%Pred-Post: 122 %
FEV1-%CHANGE-POST: 0 %
FEV1-%Pred-Post: 88 %
FEV1-%Pred-Pre: 88 %
FEV1-POST: 2.07 L
FEV1-Pre: 2.08 L
FEV1FVC-%CHANGE-POST: 6 %
FEV1FVC-%Pred-Pre: 106 %
FEV6-%CHANGE-POST: -6 %
FEV6-%Pred-Post: 79 %
FEV6-%Pred-Pre: 84 %
FEV6-PRE: 2.43 L
FEV6-Post: 2.28 L
FEV6FVC-%Change-Post: 0 %
FEV6FVC-%PRED-POST: 103 %
FEV6FVC-%PRED-PRE: 102 %
FVC-%Change-Post: -6 %
FVC-%PRED-POST: 77 %
FVC-%Pred-Pre: 82 %
FVC-PRE: 2.44 L
FVC-Post: 2.28 L
POST FEV6/FVC RATIO: 100 %
Post FEV1/FVC ratio: 91 %
Pre FEV1/FVC ratio: 85 %
Pre FEV6/FVC Ratio: 100 %
RV % PRED: 81 %
RV: 1.59 L
TLC % PRED: 89 %
TLC: 4.75 L

## 2013-04-27 LAB — CBC WITH DIFFERENTIAL/PLATELET
Basophils Absolute: 0 10*3/uL (ref 0.0–0.1)
Basophils Relative: 0.4 % (ref 0.0–3.0)
EOS ABS: 0.1 10*3/uL (ref 0.0–0.7)
Eosinophils Relative: 2.9 % (ref 0.0–5.0)
HEMATOCRIT: 40.7 % (ref 36.0–46.0)
Hemoglobin: 13.1 g/dL (ref 12.0–15.0)
LYMPHS ABS: 1.7 10*3/uL (ref 0.7–4.0)
Lymphocytes Relative: 33.9 % (ref 12.0–46.0)
MCHC: 32.3 g/dL (ref 30.0–36.0)
MCV: 84.8 fl (ref 78.0–100.0)
Monocytes Absolute: 0.4 10*3/uL (ref 0.1–1.0)
Monocytes Relative: 7.9 % (ref 3.0–12.0)
NEUTROS PCT: 54.9 % (ref 43.0–77.0)
Neutro Abs: 2.7 10*3/uL (ref 1.4–7.7)
PLATELETS: 236 10*3/uL (ref 150.0–400.0)
RBC: 4.8 Mil/uL (ref 3.87–5.11)
RDW: 14 % (ref 11.5–14.6)
WBC: 5 10*3/uL (ref 4.5–10.5)

## 2013-04-27 LAB — TSH: TSH: 1.09 u[IU]/mL (ref 0.35–5.50)

## 2013-04-27 LAB — BRAIN NATRIURETIC PEPTIDE: Pro B Natriuretic peptide (BNP): 7 pg/mL (ref 0.0–100.0)

## 2013-04-27 NOTE — Progress Notes (Signed)
Subjective:    Patient ID: Michaela Little, female    DOB: 07/08/1960 .   MRN: [id number]    Brief patient profile:  53  yobf quit smoking 1990 with asthma dx  around age 53 worse symptoms since around 2004 when eval in  [place] [facility] moved to [place]  10/2010 and referred to pulmonary clinic by Dr Bubba Camp 02/08/2011    HPI 02/08/2011 1st pulmonary eval cc doe x 50 ft plus episodes of breathing attacks(Triggers are emotional, smells, uri's )  where can't breath at rest baseline qod albuterol much better when she takes her flovent, assoc with sensation on pnds/ no purulent sputum.  rec Work on hfa, no change rx > f/u in 3 months with pft's planned > did not return  10/28/2012 f/u ov/Erline Siddoway re asthma Chief Complaint  Patient presents with  . Follow-up    Last seen 02/08/11. Pt c/o increased SOB. Pt states that Cardiologist is unsiure if SOB is related to asthma or her heart.   doe x one aisle at HT, sob nightly, no longer able to use cpap, on flovent bid and alb last dose > 8 h prior to OV  But "didn't help",  Prominent dry cough to point choking and vomit x over a year. Coughs so hard gets "crampy pains all over chest"  >>pred taper , d/c flovent , rx tramadol  Prednisone 10 mg take  4 each am x 2 days,   2 each am x 2 days,  1 each am x 2 days and stop  Take delsym two tsp every 12 hours and supplement if needed with  tramadol 50 mg up to 2 every 4 hours to suppress the urge to cough. Stop flovent and albuterol. GERD diet    11/13/2012 followup and medication review. Patient returns for a two-week followup and medication review. We reviewed all her medications and organized them into a medication calendar with patient education. Patient was having difficulty with persistent cough. Last visit. Was given a prednisone taper. She was recommended to discontinue her Flovent inhaler and was given tramadol for help with cough control. Patient reports that she is improved, and cough has substantially  decreased. She does report that she was unable to tolerate tramadol .   Spirometry last ov with minimal airflow obstruction noted.  rec Follow med calendar    04/27/2013 f/u ov/Amarya Kuehl re: ? Asthma/ no med calendar  Chief Complaint  Patient presents with  . Followup with PFT    Pt c/o increased SOB for the past month. She also c/o increased cough- prod with beige sputum. She has not used rescue inhaler in the past several wks.   Doe x 100 ft and has to sit down, sometimes uncomfortable at rest including at time of pfts s obst Cough just started x 3 weeks more day than night. saba helps when remembers to use it   No obvious patterns in day to day or daytime variabilty or assoc chronic cough or cp or chest tightness, subjective wheeze overt sinus or hb symptoms. No unusual exp hx or h/o childhood pna/ asthma or knowledge of premature birth.  Sleeping ok without nocturnal  or early am exacerbation  of respiratory  c/o's or need for noct saba. Also denies any obvious fluctuation of symptoms with weather or environmental changes or other aggravating or alleviating factors except as outlined above   Current Medications, Allergies, Complete Past Medical History, Past Surgical History, Family History, and Social History were reviewed in [employer]  electronic medical record.  ROS  The following are not active complaints unless bolded sore throat, dysphagia, dental problems, itching, sneezing,  nasal congestion or excess/ purulent secretions, ear ache,   fever, chills, sweats, unintended wt loss, pleuritic or exertional cp, hemoptysis,  orthopnea pnd or leg swelling, presyncope, palpitations, heartburn, abdominal pain, anorexia, nausea, vomiting, diarrhea  or change in bowel or urinary habits, change in stools or urine, dysuria,hematuria,  rash, arthralgias, visual complaints, headache, numbness weakness or ataxia or problems with walking or coordination,  change in mood/affect or memory.             Objective:   Physical Exam   Obese bf nad abn affect   Wt 285 02/08/2011 >  260 10/28/2012 >259 11/13/2012  > 262  12/04/2012 > 04/27/2013 273   HEENT: nl dentition, turbinates, and orophanx. Nl external ear canals without cough reflex   NECK :  without JVD/Nodes/TM/ nl carotid upstrokes bilaterally   LUNGS: no acc muscle use, clear to A and P bilaterally without cough on insp or exp maneuvers   CV:  RRR  no s3 or murmur or increase in P2, no edema   ABD:  soft and nontender with nl excursion in the supine position. No bruits or organomegaly, bowel sounds nl  MS:  warm without deformities, calf tenderness, cyanosis or clubbing  SKIN: warm and dry without lesions    NEURO:  alert, approp, no deficits     Cta chest 10/01/12 No evidence of pulmonary embolus. No acute findings.    CXR  04/27/2013 : No acute cardiopulmonary process.   Labs 04/27/2013 included bmet, cbc, bnp, tsh:  All wnl     Assessment & Plan:

## 2013-04-27 NOTE — Assessment & Plan Note (Addendum)
-   10/28/2012   Walked RA x one lap @ 185 stopped due to sob/ prominent pseudowheeze  No desat  - PFTs 04/27/2013 > wnl  - 04/27/2013  Walked RA x 2 laps @ 185 ft each stopped due to sob no desat   Symptoms are no reproducible and  markedly disproportionate to objective findings and not clear this is a lung problem but pt does appear to have difficult airway management issues. DDX of  difficult airways managment all start with A and  include Adherence, Ace Inhibitors, Acid Reflux, Active Sinus Disease, Alpha 1 Antitripsin deficiency, Anxiety masquerading as Airways dz,  ABPA,  allergy(esp in young), Aspiration (esp in elderly), Adverse effects of DPI,  Active smokers, plus two Bs  = Bronchiectasis and Beta blocker use..and one C= CHF  Adherence is always the initial "prime suspect" and is a multilayered concern that requires a "trust but verify" approach in every patient - starting with knowing how to use medications, especially inhalers, correctly, keeping up with refills and understanding the fundamental difference between maintenance and prns vs those medications only taken for a very short course and then stopped and not refilled.  - desperately needs med reconciliation:   To keep things simple, I have asked the patient to first separate medicines that are perceived as maintenance, that is to be taken daily "no matter what", from those medicines that are taken on only on an as-needed basis and I have given the patient examples of both, and then return to see our NP to generate a  detailed  medication calendar which should be followed until the next physician sees the patient and updates it.    ? Acid (or non-acid) GERD > always difficult to exclude as up to 75% of pts in some series report no assoc GI/ Heartburn symptoms> rec continue  max (24h)  acid suppression and diet restrictions/ reviewed and instructions given in writing.   ? Anxiety/ deconditioning typically  dx of exclusion but much more likely here  based on affect and lack of finding with active symptoms at rest druing ov  ? Active sinus Dz > check sinus ct esp with onset of cough s obvious source x 3 weeks prior to OV    ? chf > excluded by nl bnp  See instructions for specific recommendations which were reviewed directly with the patient who was given a copy with highlighter outlining the key components.

## 2013-04-27 NOTE — Progress Notes (Signed)
Quick Note:  Spoke with pt and notified of results per Dr. Wert. Pt verbalized understanding and denied any questions.  ______ 

## 2013-04-27 NOTE — Progress Notes (Signed)
PFT done today. 

## 2013-04-27 NOTE — Patient Instructions (Addendum)
Please remember to go to the lab and x-ray department downstairs for your tests - we will call you with the results when they are available.    Please see patient coordinator before you leave today  to schedule sinus ct  See Tammy NP w/in 2 weeks with all your medications, even over the counter meds, separated in two separate bags, the ones you take no matter what vs the ones you stop once you feel better and take only as needed when you feel you need them.   Tammy  will generate for you a new user friendly medication calendar that will put [place] all on the same page re: your medication use.     Without this process, it simply isn't possible to assure that we are providing  your outpatient care  with  the attention to detail we feel you deserve.   If we cannot assure that you're getting that kind of care,  then we cannot manage your problem effectively from this clinic.  Once you have seen Tammy and we are sure that we're all on the same page with your medication use she will arrange follow up with me.

## 2013-04-27 NOTE — Assessment & Plan Note (Signed)
-   sinus CT 04/27/2013 >>>  Still strongly favor  Classic Upper airway cough syndrome, so named because it's frequently impossible to sort out how much is  CR/sinusitis with freq throat clearing (which can be related to primary GERD)   vs  causing  secondary (" extra esophageal")  GERD from wide swings in gastric pressure that occur with throat clearing, often  promoting self use of mint and menthol lozenges that reduce the lower esophageal sphincter tone and exacerbate the problem further in a cyclical fashion.   These are the same pts (now being labeled as having "irritable larynx syndrome" by some cough centers) who not infrequently have a history of having failed to tolerate ace inhibitors,  dry powder inhalers or biphosphonates or report having atypical reflux symptoms that don't respond to standard doses of PPI , and are easily confused as having aecopd or asthma flares by even experienced allergists/ pulmonologists.   Sinus ct next, continue gerd rx

## 2013-04-27 NOTE — Assessment & Plan Note (Signed)
-   hfa 75% p coaching 12/04/2012  - Spirometry 10/28/12 > min airflow obstruction  -med calendar 11/13/2012  > not using 04/27/2013  - PFTs 04/27/2013 > wnl   No evidence of active dz

## 2013-04-28 ENCOUNTER — Ambulatory Visit: Payer: Medicare Other | Admitting: Physical Therapy

## 2013-04-30 ENCOUNTER — Ambulatory Visit: Payer: Medicare Other | Attending: Obstetrics and Gynecology | Admitting: Physical Therapy

## 2013-04-30 ENCOUNTER — Encounter: Payer: Self-pay | Admitting: Internal Medicine

## 2013-04-30 ENCOUNTER — Ambulatory Visit (INDEPENDENT_AMBULATORY_CARE_PROVIDER_SITE_OTHER)
Admission: RE | Admit: 2013-04-30 | Discharge: 2013-04-30 | Disposition: A | Discharge status: Home / Self Care | Payer: Medicare Other | Source: Ambulatory Visit | Attending: Internal Medicine | Admitting: Internal Medicine

## 2013-04-30 DIAGNOSIS — IMO0001 Reserved for inherently not codable concepts without codable children: Secondary | ICD-10-CM | POA: Diagnosis not present

## 2013-04-30 DIAGNOSIS — D259 Leiomyoma of uterus, unspecified: Secondary | ICD-10-CM | POA: Diagnosis not present

## 2013-04-30 DIAGNOSIS — M2420 Disorder of ligament, unspecified site: Secondary | ICD-10-CM | POA: Diagnosis not present

## 2013-04-30 DIAGNOSIS — M25559 Pain in unspecified hip: Secondary | ICD-10-CM | POA: Diagnosis not present

## 2013-04-30 DIAGNOSIS — I10 Essential (primary) hypertension: Secondary | ICD-10-CM | POA: Insufficient documentation

## 2013-04-30 DIAGNOSIS — M629 Disorder of muscle, unspecified: Secondary | ICD-10-CM | POA: Diagnosis not present

## 2013-04-30 DIAGNOSIS — J329 Chronic sinusitis, unspecified: Secondary | ICD-10-CM | POA: Diagnosis not present

## 2013-04-30 DIAGNOSIS — R5381 Other malaise: Secondary | ICD-10-CM | POA: Diagnosis not present

## 2013-04-30 DIAGNOSIS — R059 Cough, unspecified: Secondary | ICD-10-CM

## 2013-04-30 DIAGNOSIS — R05 Cough: Secondary | ICD-10-CM | POA: Diagnosis not present

## 2013-04-30 DIAGNOSIS — M545 Low back pain, unspecified: Secondary | ICD-10-CM | POA: Insufficient documentation

## 2013-04-30 DIAGNOSIS — J45909 Unspecified asthma, uncomplicated: Secondary | ICD-10-CM | POA: Insufficient documentation

## 2013-04-30 DIAGNOSIS — Z853 Personal history of malignant neoplasm of breast: Secondary | ICD-10-CM | POA: Diagnosis not present

## 2013-05-01 DIAGNOSIS — C50919 Malignant neoplasm of unspecified site of unspecified female breast: Secondary | ICD-10-CM | POA: Diagnosis not present

## 2013-05-05 ENCOUNTER — Ambulatory Visit: Payer: Medicare Other | Admitting: Physical Therapy

## 2013-05-05 DIAGNOSIS — IMO0001 Reserved for inherently not codable concepts without codable children: Secondary | ICD-10-CM | POA: Diagnosis not present

## 2013-05-05 DIAGNOSIS — M25559 Pain in unspecified hip: Secondary | ICD-10-CM | POA: Diagnosis not present

## 2013-05-05 DIAGNOSIS — R5381 Other malaise: Secondary | ICD-10-CM | POA: Diagnosis not present

## 2013-05-05 DIAGNOSIS — D259 Leiomyoma of uterus, unspecified: Secondary | ICD-10-CM | POA: Diagnosis not present

## 2013-05-05 DIAGNOSIS — M629 Disorder of muscle, unspecified: Secondary | ICD-10-CM | POA: Diagnosis not present

## 2013-05-05 DIAGNOSIS — M545 Low back pain, unspecified: Secondary | ICD-10-CM | POA: Diagnosis not present

## 2013-05-07 ENCOUNTER — Ambulatory Visit: Payer: Medicare Other | Admitting: Physical Therapy

## 2013-05-07 DIAGNOSIS — M25559 Pain in unspecified hip: Secondary | ICD-10-CM | POA: Diagnosis not present

## 2013-05-07 DIAGNOSIS — M629 Disorder of muscle, unspecified: Secondary | ICD-10-CM | POA: Diagnosis not present

## 2013-05-07 DIAGNOSIS — IMO0001 Reserved for inherently not codable concepts without codable children: Secondary | ICD-10-CM | POA: Diagnosis not present

## 2013-05-07 DIAGNOSIS — M545 Low back pain, unspecified: Secondary | ICD-10-CM | POA: Diagnosis not present

## 2013-05-07 DIAGNOSIS — D259 Leiomyoma of uterus, unspecified: Secondary | ICD-10-CM | POA: Diagnosis not present

## 2013-05-07 DIAGNOSIS — R5381 Other malaise: Secondary | ICD-10-CM | POA: Diagnosis not present

## 2013-05-11 ENCOUNTER — Encounter: Payer: Self-pay | Admitting: Adult Health

## 2013-05-11 ENCOUNTER — Ambulatory Visit (INDEPENDENT_AMBULATORY_CARE_PROVIDER_SITE_OTHER): Payer: Medicare Other | Admitting: Adult Health

## 2013-05-11 ENCOUNTER — Encounter: Payer: Medicare Other | Admitting: Physical Therapy

## 2013-05-11 VITALS — BP 130/78 | HR 72 | Temp 97.6°F | Ht 65.0 in | Wt 276.0 lb

## 2013-05-11 DIAGNOSIS — R05 Cough: Secondary | ICD-10-CM | POA: Diagnosis not present

## 2013-05-11 DIAGNOSIS — J45909 Unspecified asthma, uncomplicated: Secondary | ICD-10-CM

## 2013-05-11 DIAGNOSIS — R059 Cough, unspecified: Secondary | ICD-10-CM

## 2013-05-11 NOTE — Patient Instructions (Signed)
Follow med calendar closely and bring to each visit follow up Dr. Melvyn Novas  In 3 months and As needed

## 2013-05-11 NOTE — Progress Notes (Signed)
Subjective:    Patient ID: Michaela Little, female    DOB: Dec 14, 1960 .   MRN: [id number]  Brief patient profile:  72  yobf quit smoking 1990 with asthma dx  around age 53 worse symptoms since around 2004 when eval in  [place] [facility] moved to [place]  10/2010 and referred to pulmonary clinic by Dr Bubba Camp 02/08/2011    HPI 02/08/2011 1st pulmonary eval cc doe x 50 ft plus episodes of breathing attacks(Triggers are emotional, smells, uri's )  where can't breath at rest baseline qod albuterol much better when she takes her flovent, assoc with sensation on pnds/ no purulent sputum.  rec Work on hfa, no change rx > f/u in 3 months with pft's planned > did not return  10/28/2012 f/u ov/Wert re asthma Chief Complaint  Patient presents with  . Follow-up    Last seen 02/08/11. Pt c/o increased SOB. Pt states that Cardiologist is unsiure if SOB is related to asthma or her heart.   doe x one aisle at [facility], sob nightly, no longer able to use cpap, on flovent bid and alb last dose > 8 h prior to OV  But "didn't help",  Prominent dry cough to point choking and vomit x over a year. Coughs so hard gets "crampy pains all over chest"  >>pred taper , d/c flovent , rx tramadol  Prednisone 10 mg take  4 each am x 2 days,   2 each am x 2 days,  1 each am x 2 days and stop  Take delsym two tsp every 12 hours and supplement if needed with  tramadol 50 mg up to 2 every 4 hours to suppress the urge to cough. Stop flovent and albuterol. GERD diet    11/13/2012 followup and medication review. Patient returns for a two-week followup and medication review. We reviewed all her medications and organized them into a medication calendar with patient education. Patient was having difficulty with persistent cough. Last visit. Was given a prednisone taper. She was recommended to discontinue her Flovent inhaler and was given tramadol for help with cough control. Patient reports that she is improved, and cough has substantially decreased.  She does report that she was unable to tolerate tramadol .   Spirometry last ov with minimal airflow obstruction noted.  rec Follow med calendar    04/27/2013 f/u ov/Wert re: ? Asthma/ no med calendar  Chief Complaint  Patient presents with  . Followup with PFT    Pt c/o increased SOB for the past month. She also c/o increased cough- prod with beige sputum. She has not used rescue inhaler in the past several wks.   Doe x 100 ft and has to sit down, sometimes uncomfortable at rest including at time of pfts s obst Cough just started x 3 weeks more day than night. saba helps when remembers to use it  >>CT sinus   05/11/2013 followup and medication review Patient returns for a two-week followup and medication review. We reviewed all her medications organized them into a  medication calendar with patient education. Patient appears to be taking her medications correctly.  Patient underwent a CT of the sinuses that showed mild paranasal sinus mucosal thickening without acute infection. Patient says that she is about the same. Cough is decreased .  She denies any chest pain, orthopnea, PND, leg swelling, or hemoptysis  Current Medications, Allergies, Complete Past Medical History, Past Surgical History, Family History, and Social History were reviewed in [employer] record.  ROS  The following are not active complaints unless bolded sore throat, dysphagia, dental problems, itching, sneezing,  nasal congestion or excess/ purulent secretions, ear ache,   fever, chills, sweats, unintended wt loss, pleuritic or exertional cp, hemoptysis,  orthopnea pnd or leg swelling, presyncope, palpitations, heartburn, abdominal pain, anorexia, nausea, vomiting, diarrhea  or change in bowel or urinary habits, change in stools or urine, dysuria,hematuria,  rash, arthralgias, visual complaints, headache, numbness weakness or ataxia or problems with walking or coordination,  change in mood/affect or  memory.            Objective:   Physical Exam   Obese bf nad abn affect   Wt 285 02/08/2011 >  260 10/28/2012 >259 11/13/2012  > 262  12/04/2012 > 04/27/2013 273 >276 05/11/2013   HEENT: nl dentition, turbinates, and orophanx. Nl external ear canals without cough reflex   NECK :  without JVD/Nodes/TM/ nl carotid upstrokes bilaterally   LUNGS: no acc muscle use, clear to A and P bilaterally without cough on insp or exp maneuvers   CV:  RRR  no s3 or murmur or increase in P2, no edema   ABD:  soft and nontender with nl excursion in the supine position. No bruits or organomegaly, bowel sounds nl  MS:  warm without deformities, calf tenderness, cyanosis or clubbing  SKIN: warm and dry without lesions    NEURO:  alert, approp, no deficits     Cta chest 10/01/12 No evidence of pulmonary embolus. No acute findings.    CXR  04/27/2013 : No acute cardiopulmonary process.   Labs 04/27/2013 included bmet, cbc, bnp, tsh:  All wnl     Assessment & Plan:

## 2013-05-11 NOTE — Assessment & Plan Note (Signed)
Patient's medications were reviewed today and patient education was given. Computerized medication calendar was adjusted/completed   Plan  follow up 3 months and As needed

## 2013-05-11 NOTE — Assessment & Plan Note (Addendum)
Improved on present regimen  No acute sinusitis on CT sinus   Plan  Follow med calendar closely and bring to each visit follow up Dr. Melvyn Novas  In 3 months and As needed

## 2013-05-13 ENCOUNTER — Ambulatory Visit: Payer: Medicare Other | Admitting: Physical Therapy

## 2013-05-15 ENCOUNTER — Ambulatory Visit: Payer: Medicare Other | Admitting: Physical Therapy

## 2013-05-18 ENCOUNTER — Ambulatory Visit: Payer: Medicare Other | Admitting: Physical Therapy

## 2013-05-18 DIAGNOSIS — M545 Low back pain, unspecified: Secondary | ICD-10-CM | POA: Diagnosis not present

## 2013-05-18 DIAGNOSIS — M25559 Pain in unspecified hip: Secondary | ICD-10-CM | POA: Diagnosis not present

## 2013-05-18 DIAGNOSIS — R5381 Other malaise: Secondary | ICD-10-CM | POA: Diagnosis not present

## 2013-05-18 DIAGNOSIS — M629 Disorder of muscle, unspecified: Secondary | ICD-10-CM | POA: Diagnosis not present

## 2013-05-18 DIAGNOSIS — IMO0001 Reserved for inherently not codable concepts without codable children: Secondary | ICD-10-CM | POA: Diagnosis not present

## 2013-05-18 DIAGNOSIS — M2420 Disorder of ligament, unspecified site: Secondary | ICD-10-CM | POA: Diagnosis not present

## 2013-05-18 DIAGNOSIS — D259 Leiomyoma of uterus, unspecified: Secondary | ICD-10-CM | POA: Diagnosis not present

## 2013-05-20 ENCOUNTER — Ambulatory Visit (HOSPITAL_COMMUNITY)
Admission: RE | Admit: 2013-05-20 | Discharge: 2013-05-20 | Disposition: A | Discharge status: Home / Self Care | Payer: Medicare Other | Source: Ambulatory Visit | Attending: Obstetrics and Gynecology | Admitting: Obstetrics and Gynecology

## 2013-05-20 ENCOUNTER — Other Ambulatory Visit (HOSPITAL_COMMUNITY): Payer: Self-pay | Admitting: Obstetrics and Gynecology

## 2013-05-20 ENCOUNTER — Ambulatory Visit: Payer: Medicare Other | Admitting: Physical Therapy

## 2013-05-20 DIAGNOSIS — M25559 Pain in unspecified hip: Secondary | ICD-10-CM | POA: Diagnosis not present

## 2013-05-20 DIAGNOSIS — M629 Disorder of muscle, unspecified: Secondary | ICD-10-CM | POA: Diagnosis not present

## 2013-05-20 DIAGNOSIS — D259 Leiomyoma of uterus, unspecified: Secondary | ICD-10-CM | POA: Diagnosis not present

## 2013-05-20 DIAGNOSIS — IMO0001 Reserved for inherently not codable concepts without codable children: Secondary | ICD-10-CM | POA: Diagnosis not present

## 2013-05-20 DIAGNOSIS — M1610 Unilateral primary osteoarthritis, unspecified hip: Secondary | ICD-10-CM | POA: Diagnosis not present

## 2013-05-20 DIAGNOSIS — M259 Joint disorder, unspecified: Secondary | ICD-10-CM | POA: Insufficient documentation

## 2013-05-20 DIAGNOSIS — M25551 Pain in right hip: Secondary | ICD-10-CM

## 2013-05-20 DIAGNOSIS — M545 Low back pain, unspecified: Secondary | ICD-10-CM | POA: Diagnosis not present

## 2013-05-20 DIAGNOSIS — R5381 Other malaise: Secondary | ICD-10-CM | POA: Diagnosis not present

## 2013-05-20 DIAGNOSIS — M169 Osteoarthritis of hip, unspecified: Secondary | ICD-10-CM | POA: Diagnosis not present

## 2013-06-01 ENCOUNTER — Ambulatory Visit: Payer: Medicare Other | Admitting: Physical Therapy

## 2013-06-02 ENCOUNTER — Ambulatory Visit: Payer: Medicare Other | Attending: Obstetrics and Gynecology | Admitting: Physical Therapy

## 2013-06-02 DIAGNOSIS — IMO0001 Reserved for inherently not codable concepts without codable children: Secondary | ICD-10-CM | POA: Insufficient documentation

## 2013-06-02 DIAGNOSIS — M545 Low back pain, unspecified: Secondary | ICD-10-CM | POA: Diagnosis not present

## 2013-06-02 DIAGNOSIS — J45909 Unspecified asthma, uncomplicated: Secondary | ICD-10-CM | POA: Diagnosis not present

## 2013-06-02 DIAGNOSIS — M25559 Pain in unspecified hip: Secondary | ICD-10-CM | POA: Insufficient documentation

## 2013-06-02 DIAGNOSIS — I10 Essential (primary) hypertension: Secondary | ICD-10-CM | POA: Insufficient documentation

## 2013-06-02 DIAGNOSIS — M2420 Disorder of ligament, unspecified site: Secondary | ICD-10-CM | POA: Diagnosis not present

## 2013-06-02 DIAGNOSIS — D259 Leiomyoma of uterus, unspecified: Secondary | ICD-10-CM | POA: Diagnosis not present

## 2013-06-02 DIAGNOSIS — Z853 Personal history of malignant neoplasm of breast: Secondary | ICD-10-CM | POA: Diagnosis not present

## 2013-06-02 DIAGNOSIS — M629 Disorder of muscle, unspecified: Secondary | ICD-10-CM | POA: Insufficient documentation

## 2013-06-02 DIAGNOSIS — R5381 Other malaise: Secondary | ICD-10-CM | POA: Diagnosis not present

## 2013-06-05 NOTE — Addendum Note (Signed)
Addended by: Parke Poisson E on: 06/05/2013 01:28 PM   Modules accepted: Orders

## 2013-06-10 ENCOUNTER — Ambulatory Visit: Payer: Medicare Other | Admitting: Physical Therapy

## 2013-06-10 DIAGNOSIS — IMO0001 Reserved for inherently not codable concepts without codable children: Secondary | ICD-10-CM | POA: Diagnosis not present

## 2013-06-16 ENCOUNTER — Ambulatory Visit: Payer: Medicare Other | Admitting: Physical Therapy

## 2013-06-16 DIAGNOSIS — IMO0001 Reserved for inherently not codable concepts without codable children: Secondary | ICD-10-CM | POA: Diagnosis not present

## 2013-06-18 ENCOUNTER — Other Ambulatory Visit: Payer: Self-pay | Admitting: Oncology

## 2013-06-18 ENCOUNTER — Ambulatory Visit: Payer: Medicare Other | Admitting: Physical Therapy

## 2013-06-18 DIAGNOSIS — IMO0001 Reserved for inherently not codable concepts without codable children: Secondary | ICD-10-CM | POA: Diagnosis not present

## 2013-06-22 ENCOUNTER — Ambulatory Visit: Payer: Medicare Other | Admitting: Physical Therapy

## 2013-06-22 DIAGNOSIS — IMO0001 Reserved for inherently not codable concepts without codable children: Secondary | ICD-10-CM | POA: Diagnosis not present

## 2013-06-23 ENCOUNTER — Encounter: Payer: Self-pay | Admitting: Internal Medicine

## 2013-06-23 ENCOUNTER — Ambulatory Visit (INDEPENDENT_AMBULATORY_CARE_PROVIDER_SITE_OTHER): Payer: Medicare Other | Admitting: Internal Medicine

## 2013-06-23 DIAGNOSIS — K219 Gastro-esophageal reflux disease without esophagitis: Secondary | ICD-10-CM | POA: Diagnosis not present

## 2013-06-23 DIAGNOSIS — J309 Allergic rhinitis, unspecified: Secondary | ICD-10-CM

## 2013-06-23 DIAGNOSIS — I10 Essential (primary) hypertension: Secondary | ICD-10-CM

## 2013-06-23 NOTE — Progress Notes (Signed)
Patient ID: Michaela Little, female   DOB: 07/07/1960, 53 y.o.   MRN: [id number]    Chief Complaint  Patient presents with  . Medical Managment of Chronic Issues    3 month follow up   Allergies  Allergen Reactions  . Calcium Carbonate Antacid Hives    Fruit flavored Tums   . Hctz [Hydrochlorothiazide] Itching  . Lisinopril Itching   HPI 53 y/o female patient is here for routine follow up. She has sedentary lifestyle. She has tried t change her diet recently and eating more vegetables. She complaints of having a swelling on her left side of the neck for few months. She also complaints of having another mass on right mid back area. She has been taking her medication. She has an endometrial biopsy coming up with her Gyn. Reviewed notes from pulmonology. She has history of breast cancer, chronic abdominal and pelvic pain, HTN   Review of Systems   Constitutional: Negative for fever, chills, appetite change and fatigue.   HENT: Negative for congestion and mouth sores.    Eyes: Negative for visual disturbance.   Respiratory: Negative for chest tightness and shortness of breath.    Cardiovascular: Negative for chest pain, palpitations and leg swelling.   Gastrointestinal: Negative for nausea, abdominal pain, diarrhea, constipation, vomiting.   Genitourinary: Positive for pelvic pain. Negative for dysuria, frequency and vaginal bleeding.   Musculoskeletal: stable joint pain Skin: Negative for rash.   Neurological: Negative for dizziness, syncope and weakness.   Hematological: Negative for adenopathy.   Psychiatric/Behavioral: Negative for suicidal ideas, confusion, dysphoric mood and agitation. The patient is not nervous/anxious.    Past Medical History  Diagnosis Date  . Uterine fibroid   . Hyperlipidemia   . Hypertension   . Asthma   . SVD (spontaneous vaginal delivery)     x 5  . Seasonal allergies   . Arthritis     hands, knees  . History of pneumonia   . Anginal pain    BEEN GOING ON FOR COUPLE MO  . Anxiety   . Shortness of breath     occasional WITH EXERTIO N  . OSA (obstructive sleep apnea)     CPAP  , NOT USING ON REGULAR BASIS  . Tuberculosis     AS CHILD MED TREATMENT   . Heart murmur     AS CHILD   . Headache(784.0)     otc med prn  . Breast cancer 11/2008    left  . Pneumonia     hx  . Dysuria   . Spasm of muscle   . Unspecified vitamin D deficiency   . Other abnormal blood chemistry   . Dysplasia of cervix, unspecified    Current Outpatient Prescriptions on File Prior to Visit  Medication Sig Dispense Refill  . albuterol (VENTOLIN HFA) 108 (90 BASE) MCG/ACT inhaler Inhale 2 puffs into the lungs every 4 (four) hours as needed.       Marland Kitchen amLODipine (NORVASC) 10 MG tablet Take 1 tablet (10 mg total) by mouth daily.  90 tablet  3  . anastrozole (ARIMIDEX) 1 MG tablet TAKE ONE TABLET BY MOUTH ONCE DAILY  90 tablet  1  . Calcium-Vitamin D 600-200 MG-UNIT per tablet Take 2 tablets by mouth every morning.      . famotidine (PEPCID) 20 MG tablet One at bedtime  30 tablet  11  . loratadine (CLARITIN) 10 MG tablet TAKE 1 TABLET BY MOUTH ONCE DAILY AS NEEDED FOR ALLERGIES      .  omeprazole (PRILOSEC) 20 MG capsule Take 30- 60 min before your first and last meals of the day      . traMADol (ULTRAM) 50 MG tablet 1-2 every 4 hours as needed for pain or severe cough       No current facility-administered medications on file prior to visit.   Past Surgical History  Procedure Laterality Date  . Eye surgery  1961    right  . Cervical conization w/bx      done in [place]  . Colonoscopy  2011    normal per patient - [place]  . Dilation and curettage of uterus  10/15/2011    Procedure: DILATATION AND CURETTAGE;  Surgeon: Melina Schools, MD;  Location: [facility];  Service: Gynecology;  Laterality: N/A;  Conization  . Cholecystectomy N/A 05/14/2012    Procedure: LAPAROSCOPIC CHOLECYSTECTOMY;  Surgeon: Ralene Ok, MD;  Location: [facility] OR;  Service: General;   Laterality: N/A;  . Breast surgery  11/2008     done in [place] - breast cancer  . Mastectomy  11/2008    done in [place], left   Physical exam BP 130/72  Pulse 89  Temp(Src) 97.7 F (36.5 C) (Oral)  Resp 10  Wt 273 lb (123.832 kg)  SpO2 98%  LMP 02/25/2009  General- adult obese female in no acute distress Head- atraumatic, normocephalic Eyes- PERRLA, EOMI, no pallor, no icterus, no discharge Neck- no lymphadenopathy, no thyromegaly, no jugular vein distension, no palpable lumps/ knots Ears- left ear normal tympanic membrane and normal external ear canal , right ear normal tympanic membrane and normal external ear canal Chest- no chest wall deformities Cardiovascular- normal s1,s2, no murmurs/ rubs/ gallops Respiratory- bilateral clear to auscultation, no wheeze, no rhonchi, no crackles Abdomen- bowel sounds present, soft, non tender, no organomegaly, no palpable knots/ mass. Musculoskeletal- able to move all 4 extremities, steady gait, no use of assistive device Neurological- no focal deficit Psychiatry- alert and oriented to person, place and time, normal mood and affect  Labs- Lab Results  Component Value Date   WBC 5.0 04/27/2013   HGB 13.1 04/27/2013   HCT 40.7 04/27/2013   MCV 84.8 04/27/2013   PLT 236.0 04/27/2013   CMP     Component Value Date/Time   NA 139 04/27/2013 1231   NA 142 03/05/2013 1128   K 4.0 04/27/2013 1231   K 4.3 03/05/2013 1128   CL 105 04/27/2013 1231   CL 105 07/07/2012 1327   CO2 28 04/27/2013 1231   CO2 27 03/05/2013 1128   GLUCOSE 96 04/27/2013 1231   GLUCOSE 107 03/05/2013 1128   GLUCOSE 104* 07/07/2012 1327   BUN 12 04/27/2013 1231   BUN 12.0 03/05/2013 1128   CREATININE 0.7 04/27/2013 1231   CREATININE 0.7 03/05/2013 1128   CALCIUM 9.0 04/27/2013 1231   CALCIUM 9.5 03/05/2013 1128   PROT 7.7 03/05/2013 1128   PROT 7.4 10/01/2012 1240   ALBUMIN 3.7 03/05/2013 1128   ALBUMIN 3.6 10/01/2012 1240   AST 22 03/05/2013 1128   AST 18 10/01/2012 1240   ALT 16 03/05/2013 1128    ALT 10 10/01/2012 1240   ALKPHOS 133 03/05/2013 1128   ALKPHOS 117 10/01/2012 1240   BILITOT 0.89 03/05/2013 1128   BILITOT 0.8 10/01/2012 1240   GFRNONAA >90 10/01/2012 1240   GFRAA >90 10/01/2012 1240   Lab Results  Component Value Date   TSH 1.09 04/27/2013    Assessment/plan  1. Severe obesity (BMI >= 40) Spent more than  505 of this visit counselling her on weight loss, exercise schedules and overall well being. Explained that all her blood work are within normal range and no palpable lumps/ knots noted on exam. She has had similar complaints in past with all imaging and exam being normal. I personally feel, if she maintains an active healthy lifestyle, that might help with her psychological well being and help her feel better. Patient agrees on this and would like to do her best in terms of eating healthy, exercising and losing weight. Will provide nutritional referral - Amb ref to Medical Nutrition Therapy-MNT  2. Hypertension Stable bp readings. Continue amlodipine 10 mg daily  3. GERD (gastroesophageal reflux disease) Continue famotidine and prilosec for now  4. Allergic rhinitis Continue claritin 10 mg daily for now

## 2013-06-23 NOTE — Patient Instructions (Signed)
Diet Exercise to Lose Weight Exercise and a healthy diet may help you lose weight. Your doctor may suggest specific exercises. EXERCISE IDEAS AND TIPS  Choose low-cost things you enjoy doing, such as walking, bicycling, or exercising to workout videos.  Take stairs instead of the elevator.  Walk during your lunch break.  Park your car further away from work or school.  Go to a gym or an exercise class.  Start with 5 to 10 minutes of exercise each day. Build up to 30 minutes of exercise 4 to 6 days a week.  Wear shoes with good support and comfortable clothes.  Stretch before and after working out.  Work out until you breathe harder and your heart beats faster.  Drink extra water when you exercise.  Do not do so much that you hurt yourself, feel dizzy, or get very short of breath. Exercises that burn about 150 calories:  Running 1  miles in 15 minutes.  Playing volleyball for 45 to 60 minutes.  Washing and waxing a car for 45 to 60 minutes.  Playing touch football for 45 minutes.  Walking 1  miles in 35 minutes.  Pushing a stroller 1  miles in 30 minutes.  Playing basketball for 30 minutes.  Raking leaves for 30 minutes.  Bicycling 5 miles in 30 minutes.  Walking 2 miles in 30 minutes.  Dancing for 30 minutes.  Shoveling snow for 15 minutes.  Swimming laps for 20 minutes.  Walking up stairs for 15 minutes.  Bicycling 4 miles in 15 minutes.  Gardening for 30 to 45 minutes.  Jumping rope for 15 minutes.  Washing windows or floors for 45 to 60 minutes. Document Released: 04/14/2010 Document Revised: 06/04/2011 Document Reviewed: 04/14/2010 [facility] Patient Information 2014 [place], [place]. Calorie Counting Diet A calorie counting diet requires you to eat the number of calories that are right for you in a day. Calories are the measurement of how much energy you get from the food you eat. Eating the right amount of calories is important for staying at a  healthy weight. If you eat too many calories, your body will store them as fat and you may gain weight. If you eat too few calories, you may lose weight. Counting the number of calories you eat during a day will help you know if you are eating the right amount. A Registered Dietitian can determine how many calories you need in a day. The amount of calories needed varies from person to person. If your goal is to lose weight, you will need to eat fewer calories. Losing weight can benefit you if you are overweight or have health problems such as heart disease, high blood pressure, or diabetes. If your goal is to gain weight, you will need to eat more calories. Gaining weight may be necessary if you have a certain health problem that causes your body to need more energy. TIPS Whether you are increasing or decreasing the number of calories you eat during a day, it may be hard to get used to changes in what you eat and drink. The following are tips to help you keep track of the number of calories you eat.  Measure foods at home with measuring cups. This helps you know the amount of food and number of calories you are eating.  Restaurants often serve food in amounts that are larger than 1 serving. While eating out, estimate how many servings of a food you are given. For example, a serving of cooked  rice is  cup or about the size of half of a fist. Knowing serving sizes will help you be aware of how much food you are eating at restaurants.  Ask for smaller portion sizes or child-size portions at restaurants.  Plan to eat half of a meal at a restaurant. Take the rest home or share the other half with a friend.  Read the Nutrition Facts panel on food labels for calorie content and serving size. You can find out how many servings are in a package, the size of a serving, and the number of calories each serving has.  For example, a package might contain 3 cookies. The Nutrition Facts panel on that package says  that 1 serving is 1 cookie. Below that, it will say there are 3 servings in the container. The calories section of the Nutrition Facts label says there are 90 calories. This means there are 90 calories in 1 cookie (1 serving). If you eat 1 cookie you have eaten 90 calories. If you eat all 3 cookies, you have eaten 270 calories (3 servings x 90 calories = 270 calories). The list below tells you how big or small some common portion sizes are.  1 oz.........4 stacked dice.  3 oz........Marland KitchenDeck of cards.  1 tsp.......Marland KitchenTip of little finger.  1 tbs......Marland KitchenMarland KitchenThumb.  2 tbs.......Marland KitchenGolf ball.   cup......Marland KitchenHalf of a fist.  1 cup.......Marland KitchenA fist. KEEP A FOOD LOG Write down every food item you eat, the amount you eat, and the number of calories in each food you eat during the day. At the end of the day, you can add up the total number of calories you have eaten. It may help to keep a list like the one below. Find out the calorie information by reading the Nutrition Facts panel on food labels. Breakfast  Bran cereal (1 cup, 110 calories).  Fat-free milk ( cup, 45 calories). Snack  Apple (1 medium, 80 calories). Lunch  Spinach (1 cup, 20 calories).  Tomato ( medium, 20 calories).  Chicken breast strips (3 oz, 165 calories).  Shredded cheddar cheese ( cup, 110 calories).  Light [place] dressing (2 tbs, 60 calories).  Whole-wheat bread (1 slice, 80 calories).  Tub margarine (1 tsp, 35 calories).  Vegetable soup (1 cup, 160 calories). Dinner  Pork chop (3 oz, 190 calories).  Brown rice (1 cup, 215 calories).  Steamed broccoli ( cup, 20 calories).  Strawberries (1  cup, 65 calories).  Whipped cream (1 tbs, 50 calories). Daily Calorie Total: 0814 Document Released: 03/12/2005 Document Revised: 06/04/2011 Document Reviewed: 09/06/2006 [facility] Patient Information 2014 [place].

## 2013-06-24 ENCOUNTER — Ambulatory Visit: Payer: Medicare Other | Attending: Obstetrics and Gynecology | Admitting: Physical Therapy

## 2013-06-24 DIAGNOSIS — IMO0001 Reserved for inherently not codable concepts without codable children: Secondary | ICD-10-CM | POA: Insufficient documentation

## 2013-06-24 DIAGNOSIS — M2420 Disorder of ligament, unspecified site: Secondary | ICD-10-CM | POA: Insufficient documentation

## 2013-06-24 DIAGNOSIS — Z853 Personal history of malignant neoplasm of breast: Secondary | ICD-10-CM | POA: Insufficient documentation

## 2013-06-24 DIAGNOSIS — M25559 Pain in unspecified hip: Secondary | ICD-10-CM | POA: Insufficient documentation

## 2013-06-24 DIAGNOSIS — D259 Leiomyoma of uterus, unspecified: Secondary | ICD-10-CM | POA: Insufficient documentation

## 2013-06-24 DIAGNOSIS — J45909 Unspecified asthma, uncomplicated: Secondary | ICD-10-CM | POA: Insufficient documentation

## 2013-06-24 DIAGNOSIS — R5381 Other malaise: Secondary | ICD-10-CM | POA: Insufficient documentation

## 2013-06-24 DIAGNOSIS — M545 Low back pain, unspecified: Secondary | ICD-10-CM | POA: Insufficient documentation

## 2013-06-24 DIAGNOSIS — I10 Essential (primary) hypertension: Secondary | ICD-10-CM | POA: Insufficient documentation

## 2013-06-24 DIAGNOSIS — M629 Disorder of muscle, unspecified: Secondary | ICD-10-CM | POA: Insufficient documentation

## 2013-06-30 ENCOUNTER — Other Ambulatory Visit: Payer: Self-pay | Admitting: *Deleted

## 2013-07-17 NOTE — Telephone Encounter (Signed)
Please see Visit Info comments 

## 2013-07-21 DIAGNOSIS — Z853 Personal history of malignant neoplasm of breast: Secondary | ICD-10-CM | POA: Diagnosis not present

## 2013-07-21 DIAGNOSIS — N949 Unspecified condition associated with female genital organs and menstrual cycle: Secondary | ICD-10-CM | POA: Diagnosis not present

## 2013-07-27 DIAGNOSIS — H2510 Age-related nuclear cataract, unspecified eye: Secondary | ICD-10-CM | POA: Diagnosis not present

## 2013-07-27 DIAGNOSIS — H53029 Refractive amblyopia, unspecified eye: Secondary | ICD-10-CM | POA: Diagnosis not present

## 2013-07-27 DIAGNOSIS — H5210 Myopia, unspecified eye: Secondary | ICD-10-CM | POA: Diagnosis not present

## 2013-07-29 DIAGNOSIS — H53003 Unspecified amblyopia, bilateral: Secondary | ICD-10-CM | POA: Insufficient documentation

## 2013-07-29 DIAGNOSIS — H269 Unspecified cataract: Secondary | ICD-10-CM | POA: Insufficient documentation

## 2013-07-29 DIAGNOSIS — H53009 Unspecified amblyopia, unspecified eye: Secondary | ICD-10-CM | POA: Insufficient documentation

## 2013-07-29 DIAGNOSIS — H5210 Myopia, unspecified eye: Secondary | ICD-10-CM | POA: Insufficient documentation

## 2013-07-30 ENCOUNTER — Emergency Department (HOSPITAL_COMMUNITY)
Admission: EM | Admit: 2013-07-30 | Discharge: 2013-07-30 | Disposition: A | Discharge status: Home / Self Care | Payer: Medicare Other | Attending: Emergency Medicine | Admitting: Emergency Medicine

## 2013-07-30 ENCOUNTER — Encounter (HOSPITAL_COMMUNITY): Payer: Self-pay | Admitting: Emergency Medicine

## 2013-07-30 ENCOUNTER — Emergency Department (INDEPENDENT_AMBULATORY_CARE_PROVIDER_SITE_OTHER)
Admission: EM | Admit: 2013-07-30 | Discharge: 2013-07-30 | Disposition: A | Discharge status: Other Acute Inpatient Hospital | Payer: Medicare Other | Source: Home / Self Care | Attending: Family Medicine | Admitting: Family Medicine

## 2013-07-30 ENCOUNTER — Emergency Department (HOSPITAL_COMMUNITY): Payer: Medicare Other

## 2013-07-30 ENCOUNTER — Emergency Department (INDEPENDENT_AMBULATORY_CARE_PROVIDER_SITE_OTHER): Payer: Medicare Other

## 2013-07-30 DIAGNOSIS — Z8742 Personal history of other diseases of the female genital tract: Secondary | ICD-10-CM | POA: Diagnosis not present

## 2013-07-30 DIAGNOSIS — Z8659 Personal history of other mental and behavioral disorders: Secondary | ICD-10-CM | POA: Insufficient documentation

## 2013-07-30 DIAGNOSIS — Z8701 Personal history of pneumonia (recurrent): Secondary | ICD-10-CM | POA: Diagnosis not present

## 2013-07-30 DIAGNOSIS — Z9889 Other specified postprocedural states: Secondary | ICD-10-CM | POA: Insufficient documentation

## 2013-07-30 DIAGNOSIS — R1032 Left lower quadrant pain: Secondary | ICD-10-CM | POA: Insufficient documentation

## 2013-07-30 DIAGNOSIS — J45901 Unspecified asthma with (acute) exacerbation: Secondary | ICD-10-CM | POA: Diagnosis not present

## 2013-07-30 DIAGNOSIS — I10 Essential (primary) hypertension: Secondary | ICD-10-CM | POA: Insufficient documentation

## 2013-07-30 DIAGNOSIS — Z8739 Personal history of other diseases of the musculoskeletal system and connective tissue: Secondary | ICD-10-CM | POA: Insufficient documentation

## 2013-07-30 DIAGNOSIS — R319 Hematuria, unspecified: Secondary | ICD-10-CM | POA: Insufficient documentation

## 2013-07-30 DIAGNOSIS — Z9089 Acquired absence of other organs: Secondary | ICD-10-CM | POA: Diagnosis not present

## 2013-07-30 DIAGNOSIS — I209 Angina pectoris, unspecified: Secondary | ICD-10-CM | POA: Insufficient documentation

## 2013-07-30 DIAGNOSIS — R109 Unspecified abdominal pain: Secondary | ICD-10-CM

## 2013-07-30 DIAGNOSIS — E559 Vitamin D deficiency, unspecified: Secondary | ICD-10-CM | POA: Diagnosis not present

## 2013-07-30 DIAGNOSIS — Z853 Personal history of malignant neoplasm of breast: Secondary | ICD-10-CM | POA: Diagnosis not present

## 2013-07-30 DIAGNOSIS — Z87891 Personal history of nicotine dependence: Secondary | ICD-10-CM | POA: Diagnosis not present

## 2013-07-30 DIAGNOSIS — G8929 Other chronic pain: Secondary | ICD-10-CM | POA: Diagnosis not present

## 2013-07-30 DIAGNOSIS — Z79899 Other long term (current) drug therapy: Secondary | ICD-10-CM | POA: Insufficient documentation

## 2013-07-30 DIAGNOSIS — R0602 Shortness of breath: Secondary | ICD-10-CM | POA: Diagnosis not present

## 2013-07-30 DIAGNOSIS — R011 Cardiac murmur, unspecified: Secondary | ICD-10-CM | POA: Diagnosis not present

## 2013-07-30 DIAGNOSIS — R3911 Hesitancy of micturition: Secondary | ICD-10-CM | POA: Insufficient documentation

## 2013-07-30 DIAGNOSIS — Z8611 Personal history of tuberculosis: Secondary | ICD-10-CM | POA: Diagnosis not present

## 2013-07-30 LAB — POCT URINALYSIS DIP (DEVICE)
Glucose, UA: NEGATIVE mg/dL
KETONES UR: NEGATIVE mg/dL
Nitrite: NEGATIVE
PROTEIN: NEGATIVE mg/dL
Urobilinogen, UA: 0.2 mg/dL (ref 0.0–1.0)
pH: 5.5 (ref 5.0–8.0)

## 2013-07-30 LAB — COMPREHENSIVE METABOLIC PANEL
ALT: 12 U/L (ref 0–35)
AST: 19 U/L (ref 0–37)
Albumin: 3.9 g/dL (ref 3.5–5.2)
Alkaline Phosphatase: 130 U/L — ABNORMAL HIGH (ref 39–117)
BUN: 12 mg/dL (ref 6–23)
CALCIUM: 9.6 mg/dL (ref 8.4–10.5)
CO2: 27 mEq/L (ref 19–32)
CREATININE: 0.63 mg/dL (ref 0.50–1.10)
Chloride: 105 mEq/L (ref 96–112)
GLUCOSE: 85 mg/dL (ref 70–99)
Potassium: 4.4 mEq/L (ref 3.7–5.3)
Sodium: 145 mEq/L (ref 137–147)
Total Bilirubin: 0.7 mg/dL (ref 0.3–1.2)
Total Protein: 8.1 g/dL (ref 6.0–8.3)

## 2013-07-30 LAB — CBC WITH DIFFERENTIAL/PLATELET
Basophils Absolute: 0 10*3/uL (ref 0.0–0.1)
Basophils Relative: 0 % (ref 0–1)
EOS PCT: 4 % (ref 0–5)
Eosinophils Absolute: 0.2 10*3/uL (ref 0.0–0.7)
HEMATOCRIT: 41.2 % (ref 36.0–46.0)
Hemoglobin: 13.7 g/dL (ref 12.0–15.0)
LYMPHS ABS: 1.7 10*3/uL (ref 0.7–4.0)
Lymphocytes Relative: 34 % (ref 12–46)
MCH: 27.7 pg (ref 26.0–34.0)
MCHC: 33.3 g/dL (ref 30.0–36.0)
MCV: 83.2 fL (ref 78.0–100.0)
MONO ABS: 0.4 10*3/uL (ref 0.1–1.0)
Monocytes Relative: 7 % (ref 3–12)
Neutro Abs: 2.7 10*3/uL (ref 1.7–7.7)
Neutrophils Relative %: 55 % (ref 43–77)
Platelets: 252 10*3/uL (ref 150–400)
RBC: 4.95 MIL/uL (ref 3.87–5.11)
RDW: 13.7 % (ref 11.5–15.5)
WBC: 5 10*3/uL (ref 4.0–10.5)

## 2013-07-30 LAB — TROPONIN I

## 2013-07-30 LAB — PRO B NATRIURETIC PEPTIDE: PRO B NATRI PEPTIDE: 8.7 pg/mL (ref 0–125)

## 2013-07-30 LAB — LIPASE, BLOOD: Lipase: 19 U/L (ref 11–59)

## 2013-07-30 MED ORDER — KETOROLAC TROMETHAMINE 30 MG/ML IJ SOLN
30.0000 mg | Freq: Once | INTRAMUSCULAR | Status: AC
  Administered 2013-07-30: 30 mg via INTRAVENOUS
  Filled 2013-07-30: qty 1

## 2013-07-30 MED ORDER — DOCUSATE SODIUM 100 MG PO CAPS: 100.0000 mg | ORAL_CAPSULE | Freq: Two times a day (BID) | ORAL | Status: DC

## 2013-07-30 MED ORDER — ONDANSETRON 8 MG PO TBDP: 8.0000 mg | ORAL_TABLET | Freq: Three times a day (TID) | ORAL | Status: DC | PRN

## 2013-07-30 MED ORDER — OXYCODONE-ACETAMINOPHEN 5-325 MG PO TABS
1.0000 | ORAL_TABLET | Freq: Once | ORAL | Status: DC
  Filled 2013-07-30: qty 1

## 2013-07-30 MED ORDER — HYDROCODONE-ACETAMINOPHEN 5-325 MG PO TABS: 1.0000 | ORAL_TABLET | ORAL | Status: DC | PRN

## 2013-07-30 MED ORDER — MORPHINE SULFATE 4 MG/ML IJ SOLN
4.0000 mg | Freq: Once | INTRAMUSCULAR | Status: AC
  Administered 2013-07-30: 4 mg via INTRAVENOUS
  Filled 2013-07-30: qty 1

## 2013-07-30 NOTE — ED Notes (Signed)
Pt ambulating independently w/ steady gait on d/c in no acute distress, A&Ox4. D/c instructions reviewed w/ pt - pt denies any further questions or concerns at present. Rx given x3  

## 2013-07-30 NOTE — Discharge Instructions (Signed)
Abdominal Pain, Adult °Many things can cause abdominal pain. Usually, abdominal pain is not caused by a disease and will improve without treatment. It can often be observed and treated at home. Your health care provider will do a physical exam and possibly order blood tests and X-rays to help determine the seriousness of your pain. However, in many cases, more time must pass before a clear cause of the pain can be found. Before that point, your health care provider may not know if you need more testing or further treatment. °HOME CARE INSTRUCTIONS  °Monitor your abdominal pain for any changes. The following actions may help to alleviate any discomfort you are experiencing: °· Only take over-the-counter or prescription medicines as directed by your health care provider. °· Do not take laxatives unless directed to do so by your health care provider. °· Try a clear liquid diet (broth, tea, or water) as directed by your health care provider. Slowly move to a bland diet as tolerated. °SEEK MEDICAL CARE IF: °· You have unexplained abdominal pain. °· You have abdominal pain associated with nausea or diarrhea. °· You have pain when you urinate or have a bowel movement. °· You experience abdominal pain that wakes you in the night. °· You have abdominal pain that is worsened or improved by eating food. °· You have abdominal pain that is worsened with eating fatty foods. °SEEK IMMEDIATE MEDICAL CARE IF:  °· Your pain does not go away within 2 hours. °· You have a fever. °· You keep throwing up (vomiting). °· Your pain is felt only in portions of the abdomen, such as the right side or the left lower portion of the abdomen. °· You pass bloody or black tarry stools. °MAKE SURE YOU: °· Understand these instructions.   °· Will watch your condition.   °· Will get help right away if you are not doing well or get worse.   °Document Released: 12/20/2004 Document Revised: 12/31/2012 Document Reviewed: 11/19/2012 °[employer]® Patient  Information ©2014 [employer]. ° °

## 2013-07-30 NOTE — ED Notes (Addendum)
Lower rt abd pain x a couple of years  And now left sided pain also went to [facility] and they found some blood in ua came for further tests had a vaginal exam last week and states is to have uterine bx on 5/14

## 2013-07-30 NOTE — ED Provider Notes (Signed)
CSN: [id number]     Arrival date & time 07/30/13  1001 History   First MD Initiated Contact with Patient 07/30/13 1022     Chief Complaint  Patient presents with  . Abdominal Pain   (Consider location/radiation/quality/duration/timing/severity/associated sxs/prior Treatment) HPI Comments: Patient presents with acute exacerbation of chronic abdominal pain. Current exacerbation began 2 weeks ago. States she has had difficulty with various episodes of abdominal pain since 2013. She has been to see her providers and undergone several forms of imaging including pelvic sonogram and cat scan of abdomen and pelvis. Is S/P cholecystectomy. Reports that she feels current episode is different from previous and is focused at her right lateral mid-abdomen and radiates through to her right back and flank. Reports chronic issues with vomiting. Had normal bowel movement yesterday. Denies fever. Denies GU sx. No melena or weight loss.  PCP: Dr. Jerilynn Mages. Bubba Camp  Patient is a 53 y.o. female presenting with abdominal pain. The history is provided by the patient.  Abdominal Pain Associated symptoms: vomiting   Associated symptoms: no constipation, no diarrhea and no nausea     Past Medical History  Diagnosis Date  . Uterine fibroid   . Hyperlipidemia   . Hypertension   . Asthma   . SVD (spontaneous vaginal delivery)     x 5  . Seasonal allergies   . Arthritis     hands, knees  . History of pneumonia   . Anginal pain     BEEN GOING ON FOR COUPLE MO  . Anxiety   . Shortness of breath     occasional WITH EXERTIO N  . OSA (obstructive sleep apnea)     CPAP  , NOT USING ON REGULAR BASIS  . Tuberculosis     AS CHILD MED TREATMENT   . Heart murmur     AS CHILD   . Headache(784.0)     otc med prn  . Breast cancer 11/2008    left  . Pneumonia     hx  . Dysuria   . Spasm of muscle   . Unspecified vitamin D deficiency   . Other abnormal blood chemistry   . Dysplasia of cervix, unspecified    Past  Surgical History  Procedure Laterality Date  . Eye surgery  1961    right  . Cervical conization w/bx      done in [place]  . Colonoscopy  2011    normal per patient - [place]  . Dilation and curettage of uterus  10/15/2011    Procedure: DILATATION AND CURETTAGE;  Surgeon: Melina Schools, MD;  Location: [facility];  Service: Gynecology;  Laterality: N/A;  Conization  . Cholecystectomy N/A 05/14/2012    Procedure: LAPAROSCOPIC CHOLECYSTECTOMY;  Surgeon: Ralene Ok, MD;  [facility];  Service: General;  Laterality: N/A;  . Breast surgery  11/2008     done in [place] - breast cancer  . Mastectomy  11/2008    done in [place], left   Family History  Problem Relation Age of Onset  . Breast cancer Mother   . Colon cancer Mother   . Hypotension Mother   . Asthma Mother   . Diabetes type II Mother   . Arthritis Mother   . Clotting disorder Mother   . Cancer Mother     breast/colon  . Mental illness Brother   . Heart disease Brother   . Cerebral palsy Daughter   . Emphysema Brother     never smoker  . Colon  cancer Maternal Aunt   . Cancer Maternal Aunt     colon  . Colon cancer Maternal Uncle   . Cancer Maternal Uncle     colon  . Esophageal cancer Neg Hx   . Rectal cancer Neg Hx   . Stomach cancer Neg Hx    History  Substance Use Topics  . Smoking status: Former Smoker -- 0.30 packs/day for 10 years    Types: Cigarettes    Quit date: 03/26/1988  . Smokeless tobacco: Never Used  . Alcohol Use: No   OB History   Grav Para Term Preterm Abortions TAB SAB Ect Mult Living   10 5 5  5 2 3   5      Review of Systems  Constitutional: Negative.   HENT: Negative.   Eyes: Negative.   Respiratory: Negative.   Cardiovascular: Negative.   Gastrointestinal: Positive for vomiting and abdominal pain. Negative for nausea, diarrhea, constipation, blood in stool, abdominal distention, anal bleeding and rectal pain.  Endocrine: Negative for polydipsia, polyphagia and polyuria.  Genitourinary: Negative.    Musculoskeletal: Positive for back pain.  Skin: Negative.   Neurological: Negative.     Allergies  Calcium carbonate antacid; Hctz; and Lisinopril  Home Medications   Prior to Admission medications   Medication Sig Start Date End Date Taking? Authorizing Provider  albuterol (VENTOLIN HFA) 108 (90 BASE) MCG/ACT inhaler Inhale 2 puffs into the lungs every 4 (four) hours as needed.     Historical Provider, MD  amLODipine (NORVASC) 10 MG tablet Take 1 tablet (10 mg total) by mouth daily. 03/25/13   Blanchie Serve, MD  anastrozole (ARIMIDEX) 1 MG tablet TAKE ONE TABLET BY MOUTH ONCE DAILY    Chauncey Cruel, MD  Calcium-Vitamin D 600-200 MG-UNIT per tablet Take 2 tablets by mouth every morning.    Historical Provider, MD  famotidine (PEPCID) 20 MG tablet One at bedtime 10/28/12   [name], MD  loratadine (CLARITIN) 10 MG tablet TAKE 1 TABLET BY MOUTH ONCE DAILY AS NEEDED FOR ALLERGIES 12/04/12   Blanchie Serve, MD  omeprazole (PRILOSEC) 20 MG capsule Take 30- 60 min before your first and last meals of the day 10/28/12   [name], MD  traMADol (ULTRAM) 50 MG tablet 1-2 every 4 hours as needed for pain or severe cough    Historical Provider, MD   BP 145/85  Pulse 84  Temp(Src) 98.9 F (37.2 C) (Oral)  Resp 18  SpO2 100%  LMP 02/25/2009 Physical Exam  Nursing note and vitals reviewed. Constitutional: She is oriented to person, place, and time. She appears well-developed and well-nourished.  +obese  HENT:  Head: Normocephalic and atraumatic.  Eyes: Conjunctivae are normal. No scleral icterus.  Cardiovascular: Normal rate, regular rhythm and normal heart sounds.   Pulmonary/Chest: Effort normal and breath sounds normal. No respiratory distress. She has no wheezes. She has no rales. She exhibits no tenderness.  Abdominal: Soft. Normal appearance. She exhibits no distension and no mass. Bowel sounds are decreased. There is no hepatosplenomegaly. There is tenderness. There is no  rigidity, no rebound, no guarding, no CVA tenderness, no tenderness at McBurney's point and negative Murphy's sign. Hernia confirmed negative in the ventral area.    +exam limited by body habitus  Musculoskeletal: Normal range of motion.  Neurological: She is alert and oriented to person, place, and time.  Skin: Skin is warm and dry.  Psychiatric: She has a normal mood and affect. Her behavior is normal.  ED Course  Procedures (including critical care time) Labs Review Labs Reviewed  POCT URINALYSIS DIP (DEVICE) - Abnormal; Notable for the following:    Bilirubin Urine SMALL (*)    Hgb urine dipstick TRACE (*)    Leukocytes, UA SMALL (*)    All other components within normal limits    Imaging Review Dg Abd 2 Views  07/30/2013   CLINICAL DATA:  Right-sided abdominal pain.  EXAM: ABDOMEN - 2 VIEW  COMPARISON:  07/20/2011  FINDINGS: Bowel gas pattern is normal. Surgical clips from previous cholecystectomy. Multiple phleboliths in the pelvis. No acute osseous abnormality. No visible free air or free fluid.  IMPRESSION: Benign appearing abdomen.   Electronically Signed   By: Rozetta Nunnery M.D.   On: 07/30/2013 12:04     MDM   1. Abdominal pain    Abdominal pain and trace hematuria: Hx and exam do not suggest an acute abdominal process. Reviewed patient's results with her at bedside. Had a pleasant conversation about further management options. Stated I felt her options were to either be discharged home for a period of careful observation with return if symptom persist or intensify, or to follow up with her PCP for follow up evaluation and outpatient referral for any necessary imaging, or continue her evaluation today in the Emergency Room. She explained that she very much wishes to continue her evaluation in the ER today and requests transfer.   [place], [place] 07/30/13 1231

## 2013-07-30 NOTE — ED Notes (Signed)
Pt  Reports  r  Sided  abd   And   r  Flank  Pain  That  She  Reports  Has  Been present  For  Quite  A  While   She  Reports  Pain is  Also  In  rlq  As  Well          -  Pt    Reports  Has  A  History  Of  Gallbladder and  Breast  Surgery   She  Ambulates  Slowly  But  Steady  And  Upright      She  Also  Reports   Some  Swollen areas  l  Side  Of  Neck     That  Are  intermittant      In  Occurrence

## 2013-07-30 NOTE — ED Notes (Signed)
Pt  Advised  To  Remain NPO   

## 2013-07-30 NOTE — ED Provider Notes (Signed)
CSN: [id number]     Arrival date & time 07/30/13  1315 History   First MD Initiated Contact with Patient 07/30/13 1712     Chief Complaint  Patient presents with  . Abdominal Pain  . Shortness of Breath      HPI Patient reports a history of chronic abdominal pain.  She states that she's had ongoing right-sided abdominal pain for some time but over the past 2 weeks has had worsening intermittent sharp left-sided abdominal pain located in the left lower quadrant.  She's had no radiation to her flank.  No prior history kidney stones.  She was seen at urgent care and noted to have new hematuria today.  She does report some urinary hesitancy without frequency or dysuria.  No fevers or chills.  No recent nausea or vomiting.  No diarrhea.  Denies chest pain.  She reported shortness of breath somewhat in triage hours she currently denies shortness of breath.  I cannot obtain much history regarding recent shortness of breath.   Past Medical History  Diagnosis Date  . Uterine fibroid   . Hyperlipidemia   . Hypertension   . Asthma   . SVD (spontaneous vaginal delivery)     x 5  . Seasonal allergies   . Arthritis     hands, knees  . History of pneumonia   . Anginal pain     BEEN GOING ON FOR COUPLE MO  . Anxiety   . Shortness of breath     occasional WITH EXERTIO N  . OSA (obstructive sleep apnea)     CPAP  , NOT USING ON REGULAR BASIS  . Tuberculosis     AS CHILD MED TREATMENT   . Heart murmur     AS CHILD   . Headache(784.0)     otc med prn  . Breast cancer 11/2008    left  . Pneumonia     hx  . Dysuria   . Spasm of muscle   . Unspecified vitamin D deficiency   . Other abnormal blood chemistry   . Dysplasia of cervix, unspecified    Past Surgical History  Procedure Laterality Date  . Eye surgery  1961    right  . Cervical conization w/bx      done in [place]  . Colonoscopy  2011    normal per patient - [place]  . Dilation and curettage of uterus  10/15/2011    Procedure: DILATATION  AND CURETTAGE;  Surgeon: Melina Schools, MD;  Location: [facility];  Service: Gynecology;  Laterality: N/A;  Conization  . Cholecystectomy N/A 05/14/2012    Procedure: LAPAROSCOPIC CHOLECYSTECTOMY;  Surgeon: Ralene Ok, MD;  Location: [facility];  Service: General;  Laterality: N/A;  . Breast surgery  11/2008     done in [place] - breast cancer  . Mastectomy  11/2008    done in [place], left   Family History  Problem Relation Age of Onset  . Breast cancer Mother   . Colon cancer Mother   . Hypotension Mother   . Asthma Mother   . Diabetes type II Mother   . Arthritis Mother   . Clotting disorder Mother   . Cancer Mother     breast/colon  . Mental illness Brother   . Heart disease Brother   . Cerebral palsy Daughter   . Emphysema Brother     never smoker  . Colon cancer Maternal Aunt   . Cancer Maternal Aunt     colon  .  Colon cancer Maternal Uncle   . Cancer Maternal Uncle     colon  . Esophageal cancer Neg Hx   . Rectal cancer Neg Hx   . Stomach cancer Neg Hx    History  Substance Use Topics  . Smoking status: Former Smoker -- 0.30 packs/day for 10 years    Types: Cigarettes    Quit date: 03/26/1988  . Smokeless tobacco: Never Used  . Alcohol Use: No   OB History   Grav Para Term Preterm Abortions TAB SAB Ect Mult Living   10 5 5  5 2 3   5      Review of Systems  All other systems reviewed and are negative.     Allergies  Calcium carbonate antacid; Hctz; and Lisinopril  Home Medications   Prior to Admission medications   Medication Sig Start Date End Date Taking? Authorizing Provider  albuterol (VENTOLIN HFA) 108 (90 BASE) MCG/ACT inhaler Inhale 2 puffs into the lungs every 4 (four) hours as needed.    Yes Historical Provider, MD  amLODipine (NORVASC) 10 MG tablet Take 1 tablet (10 mg total) by mouth daily. 03/25/13  Yes Mahima Bubba Camp, MD  anastrozole (ARIMIDEX) 1 MG tablet TAKE ONE TABLET BY MOUTH ONCE DAILY   Yes Chauncey Cruel, MD  Calcium-Vitamin D 600-200  MG-UNIT per tablet Take 2 tablets by mouth every morning.   Yes Historical Provider, MD  famotidine (PEPCID) 20 MG tablet One at bedtime 10/28/12  Yes [name], MD  loratadine (CLARITIN) 10 MG tablet TAKE 1 TABLET BY MOUTH ONCE DAILY AS NEEDED FOR ALLERGIES 12/04/12  Yes Mahima Bubba Camp, MD  omeprazole (PRILOSEC) 20 MG capsule Take 30- 60 min before your first and last meals of the day 10/28/12  Yes [name], MD   BP 136/80  Pulse 66  Temp(Src) 98 F (36.7 C) (Oral)  Resp 10  SpO2 98%  LMP 02/25/2009 Physical Exam  Nursing note and vitals reviewed. Constitutional: She is oriented to person, place, and time. She appears well-developed and well-nourished. No distress.  HENT:  Head: Normocephalic and atraumatic.  Eyes: EOM are normal.  Neck: Normal range of motion.  Cardiovascular: Normal rate, regular rhythm and normal heart sounds.   Pulmonary/Chest: Effort normal and breath sounds normal.  Abdominal: Soft. She exhibits no distension.  Mild suprapubic abdominal tenderness with some tenderness of the left lower quadrant.  No left flank tenderness  Musculoskeletal: Normal range of motion.  Neurological: She is alert and oriented to person, place, and time.  Skin: Skin is warm and dry.  Psychiatric: She has a normal mood and affect. Judgment normal.    ED Course  Procedures (including critical care time) Labs Review Labs Reviewed  COMPREHENSIVE METABOLIC PANEL - Abnormal; Notable for the following:    Alkaline Phosphatase 130 (*)    All other components within normal limits  CBC WITH DIFFERENTIAL  LIPASE, BLOOD  TROPONIN I  PRO B NATRIURETIC PEPTIDE  Results for ALYSSHA, HOUSH (MRN [id number]) as of 07/30/2013 17:47  Ref. Range 07/30/2013 10:40  Specific Gravity, Urine Latest Range: 1.005-1.030  >=1.030  pH Latest Range: 5.0-8.0  5.5  Glucose Latest Range: NEGATIVE mg/dL NEGATIVE  Bilirubin Urine Latest Range: NEGATIVE  SMALL (A)  Ketones, ur Latest Range: NEGATIVE mg/dL  NEGATIVE  Protein Latest Range: NEGATIVE mg/dL NEGATIVE  Urobilinogen, UA Latest Range: 0.0-1.0 mg/dL 0.2  Nitrite Latest Range: NEGATIVE  NEGATIVE  Leukocytes, UA Latest Range: NEGATIVE  SMALL (A)  Hgb urine  dipstick Latest Range: NEGATIVE  TRACE (A)    Imaging Review Ct Abdomen Pelvis Wo Contrast  07/30/2013   CLINICAL DATA:  Right-sided abdominal pain, right flank pain, new hematuria  EXAM: CT ABDOMEN AND PELVIS WITHOUT CONTRAST  TECHNIQUE: Multidetector CT imaging of the abdomen and pelvis was performed following the standard protocol without IV contrast.  COMPARISON:  DG ABD 2 VIEWS dated 07/30/2013; CT ABD/PELVIS W CM dated 10/01/2012  FINDINGS: The lung bases are clear. There is evidence of prior left mastectomy with a left breast implant partially visualized.  No renal, ureteral, or bladder calculi. No obstructive uropathy. No perinephric stranding is seen. The kidneys are symmetric in size without evidence for exophytic mass. The bladder is unremarkable.  The liver demonstrates no focal abnormality. The gallbladder is surgically absent. The spleen demonstrates no focal abnormality. The adrenal glands and pancreas are normal.  The unopacified stomach, duodenum, small intestine and large intestine are unremarkable, but evaluation is limited by lack of oral contrast. There is a normal caliber appendix in the right lower quadrant without periappendiceal inflammatory changes. There is no pneumoperitoneum, pneumatosis, or portal venous gas. There is no abdominal or pelvic free fluid. There is no lymphadenopathy. There is suggestion of a uterine mass likely representing a uterine fibroid. Bilateral ovaries are unremarkable.  The abdominal aorta is normal in caliber.  The osseous structures are unremarkable.  IMPRESSION: 1. No urolithiasis or obstructive uropathy. 2. Normal appendix.   Electronically Signed   By: Kathreen Devoid   On: 07/30/2013 22:03   Dg Chest 2 View  07/30/2013   CLINICAL DATA:  Shortness of  breath.  EXAM: CHEST  2 VIEW  COMPARISON:  PA and lateral chest 04/27/2013.  FINDINGS: Soft tissue expander in the left breast is noted. Surgical clips left axilla are again seen. Lungs are clear. Heart size is normal. No pneumothorax or pleural effusion.  IMPRESSION: No acute disease.  Stable compared to prior exam   Electronically Signed   By: Inge Rise M.D.   On: 07/30/2013 15:04   Dg Abd 2 Views  07/30/2013   CLINICAL DATA:  Right-sided abdominal pain.  EXAM: ABDOMEN - 2 VIEW  COMPARISON:  07/20/2011  FINDINGS: Bowel gas pattern is normal. Surgical clips from previous cholecystectomy. Multiple phleboliths in the pelvis. No acute osseous abnormality. No visible free air or free fluid.  IMPRESSION: Benign appearing abdomen.   Electronically Signed   By: Rozetta Nunnery M.D.   On: 07/30/2013 12:04  I personally reviewed the imaging tests through PACS system I reviewed available ER/hospitalization records through the EMR    EKG Interpretation   Date/Time:  Thursday Jul 30 2013 18:27:08 EDT Ventricular Rate:  69 PR Interval:  163 QRS Duration: 85 QT Interval:  410 QTC Calculation: 439 R Axis:   32 Text Interpretation:  Sinus rhythm Minimal ST elevation, inferior leads No  significant change was found Confirmed by Tatum Massman  MD, Shawanda Sievert ([id number]) on  07/30/2013 7:28:08 PM      MDM   Final diagnoses:  Abdominal pain    Will perform noncontrast CT to evaluate for left-sided ureteral stone.  Likely more related to chronic abdominal pain.  Normal vital signs in the ER.    Hoy Morn, MD 07/30/13 2215

## 2013-07-31 NOTE — ED Provider Notes (Signed)
Medical screening examination/treatment/procedure(s) were performed by a resident physician or non-physician practitioner and as the supervising physician I was immediately available for consultation/collaboration.  Lynne Leader, MD  s  Gregor Hams, MD 07/31/13 [phone]

## 2013-08-05 ENCOUNTER — Ambulatory Visit (INDEPENDENT_AMBULATORY_CARE_PROVIDER_SITE_OTHER): Payer: Medicare Other | Admitting: Internal Medicine

## 2013-08-05 ENCOUNTER — Encounter: Payer: Self-pay | Admitting: Internal Medicine

## 2013-08-05 VITALS — BP 122/70 | HR 82 | Temp 98.1°F | Wt 279.2 lb

## 2013-08-05 DIAGNOSIS — I10 Essential (primary) hypertension: Secondary | ICD-10-CM | POA: Diagnosis not present

## 2013-08-05 DIAGNOSIS — R1032 Left lower quadrant pain: Secondary | ICD-10-CM | POA: Diagnosis not present

## 2013-08-05 DIAGNOSIS — E785 Hyperlipidemia, unspecified: Secondary | ICD-10-CM | POA: Diagnosis not present

## 2013-08-05 DIAGNOSIS — N870 Mild cervical dysplasia: Secondary | ICD-10-CM | POA: Diagnosis not present

## 2013-08-05 DIAGNOSIS — R1031 Right lower quadrant pain: Secondary | ICD-10-CM | POA: Diagnosis not present

## 2013-08-05 DIAGNOSIS — G8929 Other chronic pain: Secondary | ICD-10-CM | POA: Insufficient documentation

## 2013-08-05 DIAGNOSIS — I519 Heart disease, unspecified: Secondary | ICD-10-CM | POA: Diagnosis not present

## 2013-08-05 DIAGNOSIS — R1013 Epigastric pain: Secondary | ICD-10-CM | POA: Insufficient documentation

## 2013-08-05 NOTE — Progress Notes (Signed)
Patient ID: Michaela Little, female   DOB: [birth year], 52 y.o.   MRN: [id number]    Chief Complaint  Patient presents with  . Hospitalization Follow-up    ED follow up from 07/30/13  . other    abdominal pain   Allergies  Allergen Reactions  . Calcium Carbonate Antacid Hives    Fruit flavored Tums   . Hctz [Hydrochlorothiazide] Itching  . Lisinopril Itching   HPI 53 y/o female patient is here for follow up from ED. She was there with abdominal pain. Reviewed ct abdomen which was normal. Xray abdomen showed phleboliths. Her pain has been chronic and she has had extensive gi workup. Her EGD and colonoscopy have been normal. She is s/p cholecystectomy. normal pancreatic enzyme. Appetite is good. No nausea or vomiting but has discomfort in epigastric area and in both lower quadrant with intermittent pain. She also mentions her appointment for endometrial biopsy tomorrow with obgyn.  She has sedentary lifestyle and has obesity.  Review of Systems   Constitutional: Negative for fever, chills, appetite change and fatigue.   HENT: Negative for congestion and mouth sores.    Eyes: Negative for visual disturbance.   Respiratory: Negative for chest tightness and shortness of breath.    Cardiovascular: Negative for chest pain, palpitations and leg swelling.   Gastrointestinal: Negative for nausea, abdominal pain, diarrhea, constipation, vomiting.   Genitourinary: Positive for chronic pelvic pain. Negative for dysuria, frequency and vaginal bleeding.   Musculoskeletal: stable joint pain Skin: Negative for rash.   Neurological: Negative for dizziness, syncope and weakness.   Hematological: Negative for adenopathy.   Psychiatric/Behavioral: Negative for suicidal ideas, confusion, dysphoric mood and agitation. The patient is not nervous/anxious.    Past Medical History  Diagnosis Date  . Uterine fibroid   . Hyperlipidemia   . Hypertension   . Asthma   . SVD (spontaneous vaginal delivery)     x 5   . Seasonal allergies   . Arthritis     hands, knees  . History of pneumonia   . Anginal pain     BEEN GOING ON FOR COUPLE MO  . Anxiety   . Shortness of breath     occasional WITH EXERTIO N  . OSA (obstructive sleep apnea)     CPAP  , NOT USING ON REGULAR BASIS  . Tuberculosis     AS CHILD MED TREATMENT   . Heart murmur     AS CHILD   . Headache(784.0)     otc med prn  . Breast cancer 11/2008    left  . Pneumonia     hx  . Dysuria   . Spasm of muscle   . Unspecified vitamin D deficiency   . Other abnormal blood chemistry   . Dysplasia of cervix, unspecified    Past Surgical History  Procedure Laterality Date  . Eye surgery  1961    right  . Cervical conization w/bx      done in [place]  . Colonoscopy  2011    normal per patient - [place]  . Dilation and curettage of uterus  10/15/2011    Procedure: DILATATION AND CURETTAGE;  Surgeon: Melina Schools, MD;  Location: [facility];  Service: Gynecology;  Laterality: N/A;  Conization  . Cholecystectomy N/A 05/14/2012    Procedure: LAPAROSCOPIC CHOLECYSTECTOMY;  Surgeon: Ralene Ok, MD;  Location: [facility];  Service: General;  Laterality: N/A;  . Breast surgery  11/2008     done in [place] -  breast cancer  . Mastectomy  11/2008    done in [place], left   Medication reviewed. See [facility]  Physical exam BP 122/70  Pulse 82  Temp(Src) 98.1 F (36.7 C) (Oral)  Wt 279 lb 3.2 oz (126.644 kg)  SpO2 96%  LMP 02/25/2009  General- adult obese female in no acute distress Head- atraumatic, normocephalic Neck- no lymphadenopathy, no thyromegaly, no jugular vein distension, no palpable lumps/ knots Cardiovascular- normal s1,s2, no murmurs/ rubs/ gallops Respiratory- bilateral clear to auscultation, no wheeze, no rhonchi, no crackles Abdomen- bowel sounds present, soft, epigastric discomfort and left lower quadrant discomfort on palpation, no guarding or rigidity. Obese abdomen. no palpable knots/ mass. Musculoskeletal- able to move all 4 extremities,  steady gait, no use of assistive device Neurological- no focal deficit Psychiatry- alert and oriented to person, place and time, normal mood and affect  Labs- Lab Results  Component Value Date   WBC 5.0 07/30/2013   HGB 13.7 07/30/2013   HCT 41.2 07/30/2013   MCV 83.2 07/30/2013   PLT 252 07/30/2013   Lab Results  Component Value Date   LIPASE 19 07/30/2013   Lab Results  Component Value Date   ALT 12 07/30/2013   AST 19 07/30/2013   ALKPHOS 130* 07/30/2013   BILITOT 0.7 07/30/2013   Lab Results  Component Value Date   CREATININE 0.63 07/30/2013   Radiological exam  1/15 colonoscopy - normal  12/13 egd normal  07/30/13 abdominal xray FINDINGS: Bowel gas pattern is normal. Surgical clips from previous cholecystectomy. Multiple phleboliths in the pelvis. No acute osseous abnormality. No visible free air or free fluid.   IMPRESSION: Benign appearing abdomen.  07/30/13 ct abdomen The lung bases are clear. There is evidence of prior left mastectomy with a left breast implant partially visualized.   No renal, ureteral, or bladder calculi. No obstructive uropathy. No perinephric stranding is seen. The kidneys are symmetric in size without evidence for exophytic mass. The bladder is unremarkable.   The liver demonstrates no focal abnormality. The gallbladder is surgically absent. The spleen demonstrates no focal abnormality. The adrenal glands and pancreas are normal.   The unopacified stomach, duodenum, small intestine and large intestine are unremarkable, but evaluation is limited by lack of oral contrast. There is a normal caliber appendix in the right lower quadrant without periappendiceal inflammatory changes. There is no pneumoperitoneum, pneumatosis, or portal venous gas. There is no abdominal or pelvic free fluid. There is no lymphadenopathy. There is suggestion of a uterine mass likely representing a uterine fibroid. Bilateral ovaries are unremarkable.   The abdominal aorta is  normal in caliber.   The osseous structures are unremarkable.   IMPRESSION: 1. No urolithiasis or obstructive uropathy. 2. Normal appendix.  Assessment/plan  1. Abdominal pain, epigastric EGD have been normal in past. Recent lipase is normal. Slightly elevated ALP. Monitor this with concern for biliary stasis and risk for cholangitis. With other workup being negative, will rule out h.pylori infection. Continue ppi and h2 blocker for now - H. pylori antibody, IgG  2. Abdominal pain, chronic, bilateral lower quadrant i feel this is more from pelvic cause with gi workup being negative so far. Will review endometrial biopsy result. Her fibroid could be contributing to this. Another possibility is of cervical/ uterine prolapse. Weight loss encouraged again.

## 2013-08-06 ENCOUNTER — Other Ambulatory Visit: Payer: Self-pay | Admitting: *Deleted

## 2013-08-06 ENCOUNTER — Ambulatory Visit: Payer: Medicare Other | Admitting: Dietician

## 2013-08-06 DIAGNOSIS — N390 Urinary tract infection, site not specified: Secondary | ICD-10-CM | POA: Diagnosis not present

## 2013-08-06 DIAGNOSIS — N949 Unspecified condition associated with female genital organs and menstrual cycle: Secondary | ICD-10-CM | POA: Diagnosis not present

## 2013-08-06 LAB — H. PYLORI ANTIBODY, IGG: H Pylori IgG: 5.9 U/mL — ABNORMAL HIGH (ref 0.0–0.8)

## 2013-08-06 MED ORDER — AMOXICILLIN 500 MG PO TABS: ORAL_TABLET | ORAL | Status: DC

## 2013-08-06 MED ORDER — ESOMEPRAZOLE MAGNESIUM 40 MG PO CPDR: DELAYED_RELEASE_CAPSULE | ORAL | Status: DC

## 2013-08-06 MED ORDER — CLARITHROMYCIN 500 MG PO TABS: ORAL_TABLET | ORAL | Status: DC

## 2013-08-06 NOTE — Telephone Encounter (Signed)
Pt notified VIA phone and rx sent to the pharmacy.

## 2013-08-11 ENCOUNTER — Ambulatory Visit
Admission: RE | Admit: 2013-08-11 | Discharge: 2013-08-11 | Disposition: A | Discharge status: Home / Self Care | Payer: Medicare Other | Source: Ambulatory Visit | Attending: Endocrinology | Admitting: Endocrinology

## 2013-08-11 ENCOUNTER — Encounter: Payer: Self-pay | Admitting: Endocrinology

## 2013-08-11 ENCOUNTER — Ambulatory Visit (INDEPENDENT_AMBULATORY_CARE_PROVIDER_SITE_OTHER): Payer: Medicare Other | Admitting: Endocrinology

## 2013-08-11 VITALS — BP 122/80 | HR 80 | Temp 98.6°F | Ht 65.0 in | Wt 278.0 lb

## 2013-08-11 DIAGNOSIS — R6889 Other general symptoms and signs: Secondary | ICD-10-CM | POA: Diagnosis not present

## 2013-08-11 DIAGNOSIS — R220 Localized swelling, mass and lump, head: Secondary | ICD-10-CM

## 2013-08-11 DIAGNOSIS — R221 Localized swelling, mass and lump, neck: Secondary | ICD-10-CM

## 2013-08-11 NOTE — Patient Instructions (Signed)
Let's check an ultrasound: you will receive a phone call, about a day and time for an appointment. We'll contact you with results.

## 2013-08-11 NOTE — Progress Notes (Signed)
Subjective:    Patient ID: Michaela Little, female    DOB: 01/10/1961, 53 y.o.   MRN: [id number]  HPI Pt states 1 month of slight nodule at the left lateral neck, and assoc pain.  She feels as though she might have another on the right lateral neck.  She has no h/o thyroid problems.   Past Medical History  Diagnosis Date  . Uterine fibroid   . Hyperlipidemia   . Hypertension   . Asthma   . SVD (spontaneous vaginal delivery)     x 5  . Seasonal allergies   . Arthritis     hands, knees  . History of pneumonia   . Anginal pain     BEEN GOING ON FOR COUPLE MO  . Anxiety   . Shortness of breath     occasional WITH EXERTIO N  . OSA (obstructive sleep apnea)     CPAP  , NOT USING ON REGULAR BASIS  . Tuberculosis     AS CHILD MED TREATMENT   . Heart murmur     AS CHILD   . Headache(784.0)     otc med prn  . Breast cancer 11/2008    left  . Pneumonia     hx  . Dysuria   . Spasm of muscle   . Unspecified vitamin D deficiency   . Other abnormal blood chemistry   . Dysplasia of cervix, unspecified     Past Surgical History  Procedure Laterality Date  . Eye surgery  1961    right  . Cervical conization w/bx      done in [place]  . Colonoscopy  2011    normal per patient - [place]  . Dilation and curettage of uterus  10/15/2011    Procedure: DILATATION AND CURETTAGE;  Surgeon: Melina Schools, MD;  Location: [facility];  Service: Gynecology;  Laterality: N/A;  Conization  . Cholecystectomy N/A 05/14/2012    Procedure: LAPAROSCOPIC CHOLECYSTECTOMY;  Surgeon: Ralene Ok, MD;  Location: [facility];  Service: General;  Laterality: N/A;  . Breast surgery  11/2008     done in [place] - breast cancer  . Mastectomy  11/2008    done in [place], left    History   Social History  . Marital Status: Single    Spouse Name: N/A    Number of Children: 5  . Years of Education: N/A   Occupational History  . Disabled    Social History Main Topics  . Smoking status: Former Smoker -- 0.30 packs/day for 10  years    Types: Cigarettes    Quit date: 03/26/1988  . Smokeless tobacco: Never Used  . Alcohol Use: No  . Drug Use: No  . Sexual Activity: Not Currently   Other Topics Concern  . Not on file   Social History Narrative  . No narrative on file    Current Outpatient Prescriptions on File Prior to Visit  Medication Sig Dispense Refill  . albuterol (VENTOLIN HFA) 108 (90 BASE) MCG/ACT inhaler Inhale 2 puffs into the lungs every 4 (four) hours as needed.       . ALPRAZolam (XANAX) 0.5 MG tablet Take 1 tablet by mouth daily as needed for anxiety      . amLODipine (NORVASC) 10 MG tablet Take 1 tablet (10 mg total) by mouth daily.  90 tablet  3  . anastrozole (ARIMIDEX) 1 MG tablet TAKE ONE TABLET BY MOUTH ONCE DAILY  90 tablet  1  .  docusate sodium (COLACE) 100 MG capsule Take 1 capsule (100 mg total) by mouth every 12 (twelve) hours.  60 capsule  0  . esomeprazole (NEXIUM) 40 MG capsule Take 1 tablet daily for 2 weeks  14 capsule  0  . famotidine (PEPCID) 20 MG tablet One at bedtime  30 tablet  11  . HYDROcodone-acetaminophen (NORCO/VICODIN) 5-325 MG per tablet Take 1 tablet by mouth every 4 (four) hours as needed for moderate pain.  15 tablet  0  . loratadine (CLARITIN) 10 MG tablet TAKE 1 TABLET BY MOUTH ONCE DAILY AS NEEDED FOR ALLERGIES      . ondansetron (ZOFRAN ODT) 8 MG disintegrating tablet Take 1 tablet (8 mg total) by mouth every 8 (eight) hours as needed for nausea or vomiting.  12 tablet  0  . amoxicillin (AMOXIL) 500 MG tablet Take 2 tablet twice daily for 2 weeks  56 tablet  0  . Calcium-Vitamin D 600-200 MG-UNIT per tablet Take 2 tablets by mouth every morning.      . clarithromycin (BIAXIN) 500 MG tablet Take 1 tablet twice daily for 2 weeks  28 tablet  0   No current facility-administered medications on file prior to visit.    Allergies  Allergen Reactions  . Calcium Hives    Fruit flavored Tums   . Calcium Carbonate Antacid Hives    Fruit flavored Tums   . Hctz  [Hydrochlorothiazide] Itching  . Lisinopril Itching  . Calcium Carbonate Rash    Family History  Problem Relation Age of Onset  . Breast cancer Mother   . Colon cancer Mother   . Hypotension Mother   . Asthma Mother   . Diabetes type II Mother   . Arthritis Mother   . Clotting disorder Mother   . Cancer Mother     breast/colon  . Mental illness Brother   . Heart disease Brother   . Cerebral palsy Daughter   . Emphysema Brother     never smoker  . Colon cancer Maternal Aunt   . Cancer Maternal Aunt     colon  . Colon cancer Maternal Uncle   . Cancer Maternal Uncle     colon  . Esophageal cancer Neg Hx   . Rectal cancer Neg Hx   . Stomach cancer Neg Hx   . Thyroid disease Neg Hx     BP 122/80  Pulse 80  Temp(Src) 98.6 F (37 C) (Oral)  Ht 5\' 5"  (1.651 m)  Wt 278 lb (126.1 kg)  BMI 46.26 kg/m2  SpO2 95%  LMP 02/25/2009   Review of Systems Denies dysphagia and sob.      Objective:   Physical Exam VS: see vs page GEN: no distress HEAD: head: no deformity eyes: no periorbital swelling, no proptosis external nose and ears are normal mouth: no lesion seen NECK: supple, thyroid is not enlarged CHEST WALL: no deformity LUNGS:  Clear to auscultation CV: reg rate and rhythm, no murmur.   MUSCULOSKELETAL: muscle bulk and strength are grossly normal.  no obvious joint swelling.  gait is normal and steady.   EXTEMITIES: no deformity. Trace bilat leg edema PULSES: no carotid bruit.   NEURO:  cn 2-12 grossly intact.   readily moves all 4's.  sensation is intact to touch on all 4's.  No tremor. SKIN:  Normal texture and temperature.  No rash or suspicious lesion is visible.   NODES:  None palpable at the neck PSYCH: alert, well-oriented.  Does not appear anxious  nor depressed.  Lab Results  Component Value Date   TSH 1.09 04/27/2013  i have reviewed the records in epic from other provider(s).       Assessment & Plan:  Neck nodule: per pt report, new  Severe  obesity: this limits accuracy of exam, so we'll need to do [place].    Patient Instructions  Let's check an ultrasound: you will receive a phone call, about a day and time for an appointment. We'll contact you with results.

## 2013-08-12 ENCOUNTER — Other Ambulatory Visit: Payer: Self-pay | Admitting: *Deleted

## 2013-08-13 NOTE — H&P (Signed)
Michaela Little  DICTATION # [id number] CSN# [id number]   Margarette Asal, MD 08/13/2013 2:48 PM

## 2013-08-14 NOTE — H&P (Signed)
NAME:  Michaela Little, Michaela Little                    ACCOUNT NO.:  MEDICAL RECORD NO.:  [id number]  LOCATION:                                 FACILITY:  PHYSICIAN:  Ralene Bathe. Matthew Saras, M.D.DATE OF BIRTH:  07-26-1960  DATE OF ADMISSION: DATE OF DISCHARGE:                             HISTORY & PHYSICAL   CHIEF COMPLAINT:  Pelvic pain.  HPI:  A 53 year old, postmenopausal patient, G10, P5, that I saw originally on 07/14 with complaints of pelvic pain.  History is significant for prior history of cone biopsy in [place] and also followup at [facility] 8/13 by Dr. Ulanda Edison here locally.  She presented last year with complaints of increased pelvic pain and was told at 1 time that she may have fibroids.  Ultrasound in our office in 07/14 demonstrated 2 small fibroids, 1.5 and 14 mm.  Ovaries unremarkable. She was referred to Dr. Griselda Miner at [facility] because she was requesting a hysterectomy due to her weight and other medical issues, and I was referred to get their opinion.  Dr. Juliane Poot evaluation based on ultrasound and CT review, he did not think that a hysterectomy was warranted.  He had referred her to Orthopedics and also or some PT based on pelvic x-rays that did show some hip degeneration and also was having her treated with some vaginal Valium to help with her pelvic pain.  The pelvic views showed mild subchondral sclerosis and both hips with sclerosis at the symphysis pubis.  He did recommend that she needed an endometrial biopsy, although her endometrial thickness was only 4 mm.  I am not quite sure what they were concerned but nonetheless, we attempted endometrial biopsy in the office due to cervical stenosis and her discomfort, this could not be accomplished.  She presents now for outpatient D and C, hysteroscopy with endometrial biopsy.  This procedure including specific risks related to bleeding, infection, adjacent organ injury, other complications such as perforation may require additional  surgery discussed with her which she understands and accepts.  PAST MEDICAL HISTORY:  Significant for asthma, abnormal Pap, and a prior history of treated breast cancer.  SURGICAL HISTORY:  She has had a cholecystectomy cervical cone x2, 5 vaginal deliveries; 2 TAB, 3 SAB; and a left mastectomy.  ALLERGIES:  TUMS, LISINOPRIL, HCTZ.  CURRENT MEDICATIONS:  Albuterol p.r.n., metronidazole, cyclobenzaprine, omeprazole, and promethazine p.r.n.  FAMILY HISTORY:  Significant for arthritis in mother who had breast and colon cancer and also maternal grandmother with a history of breast cancer.  SOCIAL HISTORY:  Denies alcohol, tobacco, or drug use.  She is single.  Last Pap 07/14 was negative.  PHYSICAL EXAM:  VITAL SIGNS:  Temp 98.2, blood pressure 138/78. HEENT:  Unremarkable. NECK:  Supple without masses. LUNGS:  Clear. CARDIOVASCULAR:  Regular rate and rhythm without murmurs, rubs, gallops. BREASTS: Without masses. ABDOMEN:  Soft, flat, nontender. RECTAL:  Vulva, vagina, cervix normal.  Cervix was deformed due to her 2 prior cones. Exam difficult due to her weight, but uterus mid position, mobile.  Adnexa negative. EXTREMITIES:  Unremarkable. NEUROLOGIC:  Unremarkable.  IMPRESSION:  History of cone x2, history of small fibroids and pelvic pain, recommended endometrial  biopsy by her consult at [facility].  PLAN:  D  and C, hysteroscopy with endometrial biopsy.  Procedure and risks discussed as above.     Prabhjot Maddux M. Matthew Saras, M.D.     RM[facility]/MEDQ  D:  08/13/2013  T:  08/14/2013  Job:  [id number]

## 2013-08-18 ENCOUNTER — Encounter (HOSPITAL_BASED_OUTPATIENT_CLINIC_OR_DEPARTMENT_OTHER): Payer: Self-pay | Admitting: *Deleted

## 2013-08-18 NOTE — Progress Notes (Addendum)
NPO AFTER MN. ARRIVE AT 8403.  NEEDS CBC AND BMET.  CURRENT EKG IN CHART AND EPIC.  WILL TAKE NORVASC, NEXIUM AND ARIMIDEX AM DOS W/ SIPS OF WATER.

## 2013-08-19 ENCOUNTER — Ambulatory Visit (INDEPENDENT_AMBULATORY_CARE_PROVIDER_SITE_OTHER): Payer: Medicare Other | Admitting: Internal Medicine

## 2013-08-19 ENCOUNTER — Encounter: Payer: Self-pay | Admitting: Internal Medicine

## 2013-08-19 VITALS — BP 130/78 | HR 82 | Temp 99.0°F | Resp 12 | Wt 276.0 lb

## 2013-08-19 DIAGNOSIS — R1084 Generalized abdominal pain: Secondary | ICD-10-CM

## 2013-08-19 DIAGNOSIS — G8929 Other chronic pain: Secondary | ICD-10-CM

## 2013-08-19 DIAGNOSIS — M79606 Pain in leg, unspecified: Secondary | ICD-10-CM

## 2013-08-19 DIAGNOSIS — A048 Other specified bacterial intestinal infections: Secondary | ICD-10-CM

## 2013-08-19 DIAGNOSIS — I519 Heart disease, unspecified: Secondary | ICD-10-CM

## 2013-08-19 DIAGNOSIS — I5030 Unspecified diastolic (congestive) heart failure: Secondary | ICD-10-CM | POA: Diagnosis not present

## 2013-08-19 DIAGNOSIS — M79609 Pain in unspecified limb: Secondary | ICD-10-CM | POA: Diagnosis not present

## 2013-08-19 DIAGNOSIS — I5189 Other ill-defined heart diseases: Secondary | ICD-10-CM | POA: Insufficient documentation

## 2013-08-19 MED ORDER — MORPHINE SULFATE 15 MG PO TABS: 15.0000 mg | ORAL_TABLET | Freq: Two times a day (BID) | ORAL | Status: DC | PRN

## 2013-08-19 MED ORDER — MELOXICAM 15 MG PO TABS: 15.0000 mg | ORAL_TABLET | Freq: Every day | ORAL | Status: DC

## 2013-08-19 MED ORDER — GABAPENTIN 100 MG PO CAPS: 100.0000 mg | ORAL_CAPSULE | Freq: Three times a day (TID) | ORAL | Status: DC

## 2013-08-19 NOTE — Progress Notes (Signed)
Patient ID: Michaela Little, female   DOB: Dec 23, 1960, 53 y.o.   MRN: [id number]    Chief Complaint  Patient presents with  . Follow-up    h.pylori gastritis and pain in abdomen  . Results    Discuss Genetic Test Results    Allergies  Allergen Reactions  . Adhesive [Tape] Itching and Other (See Comments)    REDDNESS  . Calcium Carbonate Antacid Hives    Fruit flavored Tums   . Hctz [Hydrochlorothiazide] Itching  . Latex Itching  . Lisinopril Itching   HPI 53 y/o female patient is here for follow up. She was seen 2 weeks back for generalized abdominal pain (chronic) along with acute worsening epigastric discomfort. Lab showed h.pylori positive. She was started on triple therapy but picked up her medication recently and thus has not completed her course. Her epigastric pain has resolved though. She has pelvic discomfort and is getting her biopsy done tomorrow. Continues to complain of generalized pain Reviewed her genetic test result.  Has been having occassional shortness of breath, denies chest pain or dyspnea at rest  ROS See hpi, rest negative  Physical exam BP 130/78  Pulse 82  Temp(Src) 99 F (37.2 C) (Oral)  Resp 12  Wt 276 lb (125.193 kg)  SpO2 97%  LMP 02/25/2009  General- adult obese female in no acute distress Head- atraumatic, normocephalic Neck- no lymphadenopathy, no thyromegaly, no jugular vein distension, no palpable lumps/ knots Cardiovascular- normal s1,s2, no murmurs/ rubs/ gallops Respiratory- bilateral clear to auscultation, no wheeze, no rhonchi, no crackles Abdomen- bowel sounds present, soft, eft lower quadrant discomfort on palpation, no guarding or rigidity. Obese abdomen. no palpable knots/ mass. Musculoskeletal- able to move all 4 extremities, steady gait, no use of assistive device Neurological- no focal deficit Psychiatry- alert and oriented to person, place and time, normal mood and affect  Assessment/plan  1. Left ventricular diastolic  dysfunction with preserved systolic function Reviewed echocardiogram and myocardial perfusion imaging from 2014. Patient is currently on amlodipien with bp controlled. If symptom persists or worsens, will consider switching her to ACEI or b blocker instead and also consider repeat echocardiogram. Her obesity and sedentary life style are also contributing to this. She also has component of sleep apnea  2. Chronic generalized abdominal pain Likely from pelvic etiology, pending endometrial biopsy tomorrow. Will d/c her oxycodone based on genetic testing with pt being rapid metabolizer for this drug. She is sensitive to NSAIDs on test but will avoid it due to recent gastritis. Morphine 15 mg bid for now and reassess  3. Leg pain With multiple point tenderness and pain, chronic in nature , imaging and blood work normal, concern for fibromyalgia. Will try neurontin 100 mg tid and reassess.    4. Helicobacter pylori (H. pylori) infection Complete course of pantoprazole with amoxicillin and clarithromycin. Few weeks after completion of treatment , check for eradication

## 2013-08-20 ENCOUNTER — Encounter (HOSPITAL_BASED_OUTPATIENT_CLINIC_OR_DEPARTMENT_OTHER): Payer: Medicare Other | Admitting: Anesthesiology

## 2013-08-20 ENCOUNTER — Ambulatory Visit (HOSPITAL_BASED_OUTPATIENT_CLINIC_OR_DEPARTMENT_OTHER)
Admission: RE | Admit: 2013-08-20 | Discharge: 2013-08-20 | Disposition: A | Discharge status: Home / Self Care | Payer: Medicare Other | Source: Ambulatory Visit | Attending: Obstetrics and Gynecology | Admitting: Obstetrics and Gynecology

## 2013-08-20 ENCOUNTER — Ambulatory Visit (HOSPITAL_BASED_OUTPATIENT_CLINIC_OR_DEPARTMENT_OTHER): Payer: Medicare Other | Admitting: Anesthesiology

## 2013-08-20 ENCOUNTER — Encounter (HOSPITAL_BASED_OUTPATIENT_CLINIC_OR_DEPARTMENT_OTHER)
Admission: RE | Disposition: A | Discharge status: Home / Self Care | Payer: Self-pay | Source: Ambulatory Visit | Attending: Obstetrics and Gynecology

## 2013-08-20 ENCOUNTER — Encounter (HOSPITAL_BASED_OUTPATIENT_CLINIC_OR_DEPARTMENT_OTHER): Payer: Self-pay | Admitting: *Deleted

## 2013-08-20 DIAGNOSIS — K219 Gastro-esophageal reflux disease without esophagitis: Secondary | ICD-10-CM | POA: Diagnosis not present

## 2013-08-20 DIAGNOSIS — Z87891 Personal history of nicotine dependence: Secondary | ICD-10-CM | POA: Insufficient documentation

## 2013-08-20 DIAGNOSIS — Z853 Personal history of malignant neoplasm of breast: Secondary | ICD-10-CM | POA: Diagnosis not present

## 2013-08-20 DIAGNOSIS — Z79899 Other long term (current) drug therapy: Secondary | ICD-10-CM | POA: Diagnosis not present

## 2013-08-20 DIAGNOSIS — J45909 Unspecified asthma, uncomplicated: Secondary | ICD-10-CM | POA: Diagnosis not present

## 2013-08-20 DIAGNOSIS — N949 Unspecified condition associated with female genital organs and menstrual cycle: Secondary | ICD-10-CM | POA: Insufficient documentation

## 2013-08-20 DIAGNOSIS — Z9010 Acquired absence of unspecified breast and nipple: Secondary | ICD-10-CM | POA: Diagnosis not present

## 2013-08-20 DIAGNOSIS — I10 Essential (primary) hypertension: Secondary | ICD-10-CM | POA: Diagnosis not present

## 2013-08-20 DIAGNOSIS — F411 Generalized anxiety disorder: Secondary | ICD-10-CM | POA: Diagnosis not present

## 2013-08-20 DIAGNOSIS — N882 Stricture and stenosis of cervix uteri: Secondary | ICD-10-CM | POA: Diagnosis not present

## 2013-08-20 HISTORY — DX: Personal history of malignant neoplasm of breast: Z85.3

## 2013-08-20 HISTORY — PX: EXAMINATION UNDER ANESTHESIA: SHX1540

## 2013-08-20 HISTORY — DX: Gastro-esophageal reflux disease without esophagitis: K21.9

## 2013-08-20 HISTORY — DX: Procedure and treatment not carried out because of patient's decision for reasons of belief and group pressure: Z53.1

## 2013-08-20 HISTORY — DX: Other forms of dyspnea: R06.09

## 2013-08-20 HISTORY — DX: Personal history of cervical dysplasia: Z87.410

## 2013-08-20 HISTORY — DX: Other specified bacterial intestinal infections: A04.8

## 2013-08-20 HISTORY — DX: Dyspnea, unspecified: R06.00

## 2013-08-20 HISTORY — DX: Reserved for inherently not codable concepts without codable children: IMO0001

## 2013-08-20 HISTORY — DX: Pelvic and perineal pain: R10.2

## 2013-08-20 HISTORY — DX: Presence of spectacles and contact lenses: Z97.3

## 2013-08-20 HISTORY — DX: Stricture and stenosis of cervix uteri: N88.2

## 2013-08-20 LAB — CBC
HCT: 38 % (ref 36.0–46.0)
HEMOGLOBIN: 12.5 g/dL (ref 12.0–15.0)
MCH: 27.2 pg (ref 26.0–34.0)
MCHC: 32.9 g/dL (ref 30.0–36.0)
MCV: 82.8 fL (ref 78.0–100.0)
Platelets: 243 10*3/uL (ref 150–400)
RBC: 4.59 MIL/uL (ref 3.87–5.11)
RDW: 13.8 % (ref 11.5–15.5)
WBC: 5.9 10*3/uL (ref 4.0–10.5)

## 2013-08-20 LAB — BASIC METABOLIC PANEL
BUN: 15 mg/dL (ref 6–23)
CALCIUM: 9.4 mg/dL (ref 8.4–10.5)
CO2: 26 mEq/L (ref 19–32)
Chloride: 100 mEq/L (ref 96–112)
Creatinine, Ser: 0.74 mg/dL (ref 0.50–1.10)
GFR calc Af Amer: >90 mL/min (ref >90)
Glucose, Bld: 97 mg/dL (ref 70–99)
Potassium: 4.2 mEq/L (ref 3.7–5.3)
SODIUM: 139 meq/L (ref 137–147)

## 2013-08-20 SURGERY — EXAM UNDER ANESTHESIA
Anesthesia: Monitor Anesthesia Care | Site: Vagina

## 2013-08-20 MED ORDER — PROMETHAZINE HCL 25 MG/ML IJ SOLN
6.2500 mg | INTRAMUSCULAR | Status: DC | PRN
Start: 2013-08-20 — End: 2013-08-20
  Filled 2013-08-20: qty 1

## 2013-08-20 MED ORDER — MIDAZOLAM HCL 5 MG/5ML IJ SOLN
INTRAMUSCULAR | Status: DC | PRN
  Administered 2013-08-20 (×2): 2 mg via INTRAVENOUS

## 2013-08-20 MED ORDER — HYDROMORPHONE HCL PF 1 MG/ML IJ SOLN
0.2500 mg | INTRAMUSCULAR | Status: DC | PRN
  Filled 2013-08-20: qty 1

## 2013-08-20 MED ORDER — FENTANYL CITRATE 0.05 MG/ML IJ SOLN
INTRAMUSCULAR | Status: DC | PRN
  Administered 2013-08-20 (×2): 50 ug via INTRAVENOUS

## 2013-08-20 MED ORDER — KETOROLAC TROMETHAMINE 30 MG/ML IJ SOLN
INTRAMUSCULAR | Status: DC | PRN
  Administered 2013-08-20: 30 mg via INTRAVENOUS

## 2013-08-20 MED ORDER — LACTATED RINGERS IV SOLN
INTRAVENOUS | Status: DC
  Filled 2013-08-20: qty 1000

## 2013-08-20 MED ORDER — SODIUM CHLORIDE 0.9 % IV SOLN
INTRAVENOUS | Status: DC
  Administered 2013-08-20: 06:00:00 via INTRAVENOUS
  Filled 2013-08-20: qty 1000

## 2013-08-20 MED ORDER — LIDOCAINE HCL 1 % IJ SOLN
INTRAMUSCULAR | Status: DC | PRN
  Administered 2013-08-20: 12 mL

## 2013-08-20 MED ORDER — GLYCINE 1.5 % IR SOLN
Status: DC | PRN
  Administered 2013-08-20: 3000 mL

## 2013-08-20 MED ORDER — PROPOFOL 10 MG/ML IV EMUL
INTRAVENOUS | Status: DC | PRN
  Administered 2013-08-20: 50 ug/kg/min via INTRAVENOUS

## 2013-08-20 MED ORDER — FENTANYL CITRATE 0.05 MG/ML IJ SOLN
INTRAMUSCULAR | Status: AC
  Filled 2013-08-20: qty 4

## 2013-08-20 MED ORDER — LIDOCAINE HCL (CARDIAC) 20 MG/ML IV SOLN
INTRAVENOUS | Status: DC | PRN
  Administered 2013-08-20: 80 mg via INTRAVENOUS

## 2013-08-20 MED ORDER — DEXTROSE 5 % IV SOLN
2.0000 g | INTRAVENOUS | Status: AC
  Administered 2013-08-20: 2 g via INTRAVENOUS
  Filled 2013-08-20: qty 2

## 2013-08-20 MED ORDER — MIDAZOLAM HCL 2 MG/2ML IJ SOLN
INTRAMUSCULAR | Status: AC
  Filled 2013-08-20: qty 2

## 2013-08-20 MED ORDER — PROPOFOL 10 MG/ML IV BOLUS
INTRAVENOUS | Status: DC | PRN
  Administered 2013-08-20: 50 mg via INTRAVENOUS

## 2013-08-20 MED ORDER — DEXAMETHASONE SODIUM PHOSPHATE 4 MG/ML IJ SOLN
INTRAMUSCULAR | Status: DC | PRN
  Administered 2013-08-20: 10 mg via INTRAVENOUS

## 2013-08-20 SURGICAL SUPPLY — 37 items
CANISTER SUCTION 2500CC (MISCELLANEOUS) IMPLANT
CATH ROBINSON RED A/P 16FR (CATHETERS) IMPLANT
CATH SILICONE 16FRX5CC (CATHETERS) ×4 IMPLANT
CLOTH BEACON ORANGE TIMEOUT ST (SAFETY) ×4 IMPLANT
CORD ACTIVE DISPOSABLE (ELECTRODE)
CORD ELECTRO ACTIVE DISP (ELECTRODE) IMPLANT
COVER TABLE BACK 60X90 (DRAPES) ×4 IMPLANT
DRAPE CAMERA CLOSED 9X96 (DRAPES) ×4 IMPLANT
DRAPE HYSTEROSCOPY (DRAPE) ×4 IMPLANT
DRAPE LG THREE QUARTER DISP (DRAPES) ×4 IMPLANT
DRESSING TELFA 8X3 (GAUZE/BANDAGES/DRESSINGS) ×4 IMPLANT
ELECT LOOP GYNE PRO 24FR (CUTTING LOOP)
ELECT REM PT RETURN 9FT ADLT (ELECTROSURGICAL) ×4
ELECT VAPORTRODE GRVD BAR (ELECTRODE) IMPLANT
ELECTRODE LOOP GYNE PRO 24FR (CUTTING LOOP) IMPLANT
ELECTRODE REM PT RTRN 9FT ADLT (ELECTROSURGICAL) ×2 IMPLANT
ELECTRODE ROLLER BARREL 22FR (ELECTROSURGICAL) IMPLANT
ELECTRODE VAPORCUT 22FR (ELECTROSURGICAL) IMPLANT
GLOVE BIO SURGEON STRL SZ7 (GLOVE) ×8 IMPLANT
GLOVE BIOGEL PI IND STRL 7.5 (GLOVE) ×4 IMPLANT
GLOVE BIOGEL PI INDICATOR 7.5 (GLOVE) ×4
GLOVE SKINSENSE NS SZ7.0 (GLOVE) ×4
GLOVE SKINSENSE STRL SZ7.0 (GLOVE) ×4 IMPLANT
GLOVE SURG SS PI 7.5 STRL IVOR (GLOVE) ×4 IMPLANT
GLYCINE 1.5% IRRIG UROMATIC (IV SOLUTION) ×4 IMPLANT
GOWN PREVENTION PLUS LG XLONG (DISPOSABLE) IMPLANT
GOWN STRL REIN XL XLG (GOWN DISPOSABLE) IMPLANT
GOWN STRL REUS W/TWL XL LVL3 (GOWN DISPOSABLE) ×8 IMPLANT
LEGGING LITHOTOMY PAIR STRL (DRAPES) ×4 IMPLANT
LOOP ANGLED CUTTING 22FR (CUTTING LOOP) IMPLANT
PACK BASIN DAY SURGERY FS (CUSTOM PROCEDURE TRAY) ×4 IMPLANT
PAD OB MATERNITY 4.3X12.25 (PERSONAL CARE ITEMS) ×4 IMPLANT
PAD PREP 24X48 CUFFED NSTRL (MISCELLANEOUS) ×4 IMPLANT
SET TUBING HYSTEROSCOPY 2 NDL (TUBING) ×4 IMPLANT
TOWEL OR 17X24 6PK STRL BLUE (TOWEL DISPOSABLE) ×8 IMPLANT
TUBE HYSTEROSCOPY W Y-CONNECT (TUBING) ×4 IMPLANT
WATER STERILE IRR 500ML POUR (IV SOLUTION) ×4 IMPLANT

## 2013-08-20 NOTE — Anesthesia Postprocedure Evaluation (Signed)
Anesthesia Post Note  Patient: Michaela Little  Procedure(s) Performed: Procedure(s) (LRB): EXAM UNDER ANESTHESIA (N/A)  Anesthesia type: General  Patient location: PACU  Post pain: Pain level controlled  Post assessment: Post-op Vital signs reviewed  Last Vitals:  Filed Vitals:   08/20/13 0946  BP: 157/87  Pulse: 66  Temp: 36.2 C  Resp: 16    Post vital signs: Reviewed  Level of consciousness: sedated  Complications: No apparent anesthesia complications

## 2013-08-20 NOTE — Transfer of Care (Signed)
Immediate Anesthesia Transfer of Care Note  Patient: Michaela Little  Procedure(s) Performed: Procedure(s): EXAM UNDER ANESTHESIA (N/A)  Patient Location: PACU  Anesthesia Type:MAC  Level of Consciousness: sedated and responds to stimulation  Airway & Oxygen Therapy: Patient Spontanous Breathing and Patient connected to face mask oxygen  Post-op Assessment: Report given to PACU RN  Post vital signs: Reviewed and stable  Complications: No apparent anesthesia complications

## 2013-08-20 NOTE — Anesthesia Preprocedure Evaluation (Addendum)
Anesthesia Evaluation  Patient identified by MRN, date of birth, ID band Patient awake    Reviewed: Allergy & Precautions, H&P , NPO status , Patient's Chart, lab work & pertinent test results, reviewed documented beta blocker date and time   Airway Mallampati: II TM Distance: >3 FB Neck ROM: full    Dental  (+) Poor Dentition, Chipped,    Pulmonary shortness of breath, asthma , sleep apnea , resolvedneg pneumonia -, former smoker,  breath sounds clear to auscultation        Cardiovascular hypertension, Pt. on medications - angina+ Valvular Problems/Murmurs Rhythm:regular     Neuro/Psych  Headaches, Anxiety negative neurological ROS  negative psych ROS   GI/Hepatic Neg liver ROS, GERD-  Medicated,  Endo/Other  Morbid obesity  Renal/GU negative Renal ROS  negative genitourinary   Musculoskeletal negative musculoskeletal ROS (+)   Abdominal   Peds negative pediatric ROS (+)  Hematology negative hematology ROS (+)   Anesthesia Other Findings See surgeon's H&P   Reproductive/Obstetrics negative OB ROS                          Anesthesia Physical Anesthesia Plan  ASA: III  Anesthesia Plan: MAC   Post-op Pain Management:    Induction: Intravenous  Airway Management Planned: Simple Face Mask  Additional Equipment:   Intra-op Plan:   Post-operative Plan: Extubation in OR  Informed Consent: I have reviewed the patients History and Physical, chart, labs and discussed the procedure including the risks, benefits and alternatives for the proposed anesthesia with the patient or authorized representative who has indicated his/her understanding and acceptance.   Dental advisory given  Plan Discussed with: CRNA  Anesthesia Plan Comments:        Anesthesia Quick Evaluation

## 2013-08-20 NOTE — Op Note (Signed)
Preoperative diagnosis: Rule out endometrial hyperplasia, history of cervical pain x2  Postoperative diagnosis: Same  Procedure: Attempted D&C hysteroscopy, exam under anesthesia  Surgeon: Matthew Saras  Anesthesia paracervical block plus sedation  Procedure and findings:  This patient has a history of 2 prior cervical conization's, tented endometrial biopsy in the office could not really get her to relax nor identify the cervical os. Patient was taken to the operating room for exam under anesthesia/D&C. After an adequate level of sedation was obtained with the legs in stirrups the perineum and vagina were prepped and draped with Betadine bladder was drained. Appropriate timeout taken at that point.  EUA revealed that the uterus was upper limit of normal size adnexa negative. Large weighted speculum was positioned, anterior retractor was then used to expose the cervix which was very flush with the vagina had some significant scarring from her 2 prior conization, no readily identifiable os. Paracervical block was then prepared by first infiltrating the anterior cervical lip tenaculum was positioned in in about the 7 cc 1% plain Xylocaine was injected at 3 and 9:00 at the cervical vaginal margin after negative aspiration. Multiple attempts with small dilators to find a cervical os were unsuccessful. The procedure was terminated at that point.  She tolerated this well went to recovery room in good condition.  Dictated with [facility]  Michaela Little M. Garry Heater.D.

## 2013-08-20 NOTE — Discharge Instructions (Signed)
°  Post Anesthesia Home Care Instructions ° °Activity: °Get plenty of rest for the remainder of the day. A responsible adult should stay with you for 24 hours following the procedure.  °For the next 24 hours, DO NOT: °-Drive a car °-Operate machinery °-Drink alcoholic beverages °-Take any medication unless instructed by your physician °-Make any legal decisions or sign important papers. ° °Meals: °Start with liquid foods such as gelatin or soup. Progress to regular foods as tolerated. Avoid greasy, spicy, heavy foods. If nausea and/or vomiting occur, drink only clear liquids until the nausea and/or vomiting subsides. Call your physician if vomiting continues. ° °Special Instructions/Symptoms: °Your throat may feel dry or sore from the anesthesia or the breathing tube placed in your throat during surgery. If this causes discomfort, gargle with warm salt water. The discomfort should disappear within 24 hours. ° ° °D & C Home care Instructions: ° ° °Personal hygiene:  Used sanitary napkins for vaginal drainage not tampons. Shower or tub bathe the day after your procedure. No douching until bleeding stops. Always wipe from front to back after  Elimination. ° °Activity: Do not drive or operate any equipment today. The effects of the anesthesia are still present and drowsiness may result. Rest today, not necessarily flat bed rest, just take it easy. You may resume your normal activity in one to 2 days. ° °Sexual activity: No intercourse for one week or as indicated by your physician ° °Diet: Eat a light diet as desired this evening. You may resume a regular diet tomorrow. ° °Return to work: One to 2 days. ° °General Expectations of your surgery: Vaginal bleeding should be no heavier than a normal period. Spotting may continue up to 10 days. Mild cramps may continue for a couple of days. You may have a regular period in 2-6 weeks. ° °Unexpected observations call your doctor if these occur: persistent or heavy bleeding.  Severe abdominal cramping or pain. Elevation of temperature greater than 100°F. ° °Call for an appointment in one week. ° ° ° °Patient's Signature_______________________________________________________ ° °Nurse's Signature________________________________________________________ °

## 2013-08-20 NOTE — Progress Notes (Signed)
The patient was re-examined with no change in status 

## 2013-08-21 ENCOUNTER — Encounter (HOSPITAL_BASED_OUTPATIENT_CLINIC_OR_DEPARTMENT_OTHER): Payer: Self-pay | Admitting: Obstetrics and Gynecology

## 2013-08-24 ENCOUNTER — Other Ambulatory Visit: Payer: Self-pay | Admitting: *Deleted

## 2013-08-24 MED ORDER — LORATADINE 10 MG PO TABS: ORAL_TABLET | ORAL | Status: DC

## 2013-08-24 NOTE — Telephone Encounter (Signed)
[facility] 

## 2013-09-02 DIAGNOSIS — Z853 Personal history of malignant neoplasm of breast: Secondary | ICD-10-CM | POA: Diagnosis not present

## 2013-09-02 DIAGNOSIS — R0602 Shortness of breath: Secondary | ICD-10-CM | POA: Diagnosis not present

## 2013-09-02 DIAGNOSIS — M7989 Other specified soft tissue disorders: Secondary | ICD-10-CM | POA: Diagnosis not present

## 2013-09-02 DIAGNOSIS — N949 Unspecified condition associated with female genital organs and menstrual cycle: Secondary | ICD-10-CM | POA: Diagnosis not present

## 2013-09-02 DIAGNOSIS — E7800 Pure hypercholesterolemia, unspecified: Secondary | ICD-10-CM | POA: Diagnosis not present

## 2013-09-02 DIAGNOSIS — D259 Leiomyoma of uterus, unspecified: Secondary | ICD-10-CM | POA: Diagnosis not present

## 2013-09-02 DIAGNOSIS — N281 Cyst of kidney, acquired: Secondary | ICD-10-CM | POA: Diagnosis not present

## 2013-09-02 DIAGNOSIS — R109 Unspecified abdominal pain: Secondary | ICD-10-CM | POA: Diagnosis not present

## 2013-09-02 DIAGNOSIS — K838 Other specified diseases of biliary tract: Secondary | ICD-10-CM | POA: Diagnosis not present

## 2013-09-02 DIAGNOSIS — Z6841 Body Mass Index (BMI) 40.0 and over, adult: Secondary | ICD-10-CM | POA: Diagnosis not present

## 2013-09-02 DIAGNOSIS — R0609 Other forms of dyspnea: Secondary | ICD-10-CM | POA: Diagnosis not present

## 2013-09-02 DIAGNOSIS — R079 Chest pain, unspecified: Secondary | ICD-10-CM | POA: Diagnosis not present

## 2013-09-02 DIAGNOSIS — N72 Inflammatory disease of cervix uteri: Secondary | ICD-10-CM | POA: Diagnosis not present

## 2013-09-02 DIAGNOSIS — N854 Malposition of uterus: Secondary | ICD-10-CM | POA: Diagnosis not present

## 2013-09-02 DIAGNOSIS — J45909 Unspecified asthma, uncomplicated: Secondary | ICD-10-CM | POA: Diagnosis not present

## 2013-09-02 DIAGNOSIS — I10 Essential (primary) hypertension: Secondary | ICD-10-CM | POA: Diagnosis not present

## 2013-09-02 DIAGNOSIS — G4733 Obstructive sleep apnea (adult) (pediatric): Secondary | ICD-10-CM | POA: Diagnosis present

## 2013-09-02 DIAGNOSIS — R0989 Other specified symptoms and signs involving the circulatory and respiratory systems: Secondary | ICD-10-CM | POA: Diagnosis not present

## 2013-09-02 DIAGNOSIS — E785 Hyperlipidemia, unspecified: Secondary | ICD-10-CM | POA: Diagnosis present

## 2013-09-03 ENCOUNTER — Ambulatory Visit: Payer: Medicare Other | Admitting: Dietician

## 2013-09-03 ENCOUNTER — Other Ambulatory Visit: Payer: Medicare Other

## 2013-09-03 ENCOUNTER — Encounter: Payer: Self-pay | Admitting: Physician Assistant

## 2013-09-03 ENCOUNTER — Ambulatory Visit: Payer: Medicare Other | Admitting: Physician Assistant

## 2013-09-03 NOTE — Progress Notes (Signed)
[facility] today.  Letter mailed to patient.   Micah Flesher, PA-C 09/03/2013

## 2013-09-06 DIAGNOSIS — N390 Urinary tract infection, site not specified: Secondary | ICD-10-CM | POA: Diagnosis not present

## 2013-09-06 DIAGNOSIS — R059 Cough, unspecified: Secondary | ICD-10-CM | POA: Diagnosis not present

## 2013-09-06 DIAGNOSIS — Z9119 Patient's noncompliance with other medical treatment and regimen: Secondary | ICD-10-CM | POA: Diagnosis not present

## 2013-09-06 DIAGNOSIS — Z0289 Encounter for other administrative examinations: Secondary | ICD-10-CM | POA: Diagnosis not present

## 2013-09-06 DIAGNOSIS — R32 Unspecified urinary incontinence: Secondary | ICD-10-CM | POA: Diagnosis present

## 2013-09-06 DIAGNOSIS — R05 Cough: Secondary | ICD-10-CM | POA: Diagnosis not present

## 2013-09-06 DIAGNOSIS — E785 Hyperlipidemia, unspecified: Secondary | ICD-10-CM | POA: Diagnosis not present

## 2013-09-06 DIAGNOSIS — I10 Essential (primary) hypertension: Secondary | ICD-10-CM | POA: Diagnosis not present

## 2013-09-06 DIAGNOSIS — R5383 Other fatigue: Secondary | ICD-10-CM | POA: Diagnosis not present

## 2013-09-06 DIAGNOSIS — R0602 Shortness of breath: Secondary | ICD-10-CM | POA: Diagnosis not present

## 2013-09-06 DIAGNOSIS — H52209 Unspecified astigmatism, unspecified eye: Secondary | ICD-10-CM | POA: Diagnosis present

## 2013-09-06 DIAGNOSIS — R1110 Vomiting, unspecified: Secondary | ICD-10-CM | POA: Diagnosis not present

## 2013-09-06 DIAGNOSIS — D259 Leiomyoma of uterus, unspecified: Secondary | ICD-10-CM | POA: Diagnosis present

## 2013-09-06 DIAGNOSIS — R112 Nausea with vomiting, unspecified: Secondary | ICD-10-CM | POA: Diagnosis not present

## 2013-09-06 DIAGNOSIS — Z91199 Patient's noncompliance with other medical treatment and regimen due to unspecified reason: Secondary | ICD-10-CM | POA: Diagnosis not present

## 2013-09-06 DIAGNOSIS — G4733 Obstructive sleep apnea (adult) (pediatric): Secondary | ICD-10-CM | POA: Diagnosis present

## 2013-09-06 DIAGNOSIS — C50919 Malignant neoplasm of unspecified site of unspecified female breast: Secondary | ICD-10-CM | POA: Diagnosis not present

## 2013-09-06 DIAGNOSIS — R5381 Other malaise: Secondary | ICD-10-CM | POA: Diagnosis not present

## 2013-09-06 DIAGNOSIS — R109 Unspecified abdominal pain: Secondary | ICD-10-CM | POA: Diagnosis not present

## 2013-09-06 DIAGNOSIS — N949 Unspecified condition associated with female genital organs and menstrual cycle: Secondary | ICD-10-CM | POA: Diagnosis not present

## 2013-09-06 DIAGNOSIS — M129 Arthropathy, unspecified: Secondary | ICD-10-CM | POA: Diagnosis present

## 2013-09-06 DIAGNOSIS — Z853 Personal history of malignant neoplasm of breast: Secondary | ICD-10-CM | POA: Diagnosis not present

## 2013-09-06 DIAGNOSIS — H269 Unspecified cataract: Secondary | ICD-10-CM | POA: Diagnosis present

## 2013-09-07 ENCOUNTER — Encounter: Payer: Self-pay | Admitting: Internal Medicine

## 2013-09-07 ENCOUNTER — Telehealth: Payer: Self-pay | Admitting: Oncology

## 2013-09-07 NOTE — Telephone Encounter (Signed)
Sent letter to patient from Dr. Jana Hakim office.

## 2013-09-22 ENCOUNTER — Ambulatory Visit: Payer: Medicare Other | Admitting: Internal Medicine

## 2013-09-30 DIAGNOSIS — N949 Unspecified condition associated with female genital organs and menstrual cycle: Secondary | ICD-10-CM | POA: Diagnosis not present

## 2013-10-19 ENCOUNTER — Encounter: Payer: Medicare Other | Attending: Internal Medicine | Admitting: Dietician

## 2013-10-19 ENCOUNTER — Encounter: Payer: Self-pay | Admitting: Dietician

## 2013-10-19 VITALS — Ht 65.5 in | Wt 264.2 lb

## 2013-10-19 DIAGNOSIS — Z6841 Body Mass Index (BMI) 40.0 and over, adult: Secondary | ICD-10-CM | POA: Insufficient documentation

## 2013-10-19 DIAGNOSIS — Z713 Dietary counseling and surveillance: Secondary | ICD-10-CM | POA: Insufficient documentation

## 2013-10-19 DIAGNOSIS — Z859 Personal history of malignant neoplasm, unspecified: Secondary | ICD-10-CM | POA: Diagnosis not present

## 2013-10-19 DIAGNOSIS — E669 Obesity, unspecified: Secondary | ICD-10-CM | POA: Diagnosis not present

## 2013-10-19 NOTE — Patient Instructions (Addendum)
-  Continue to walk, and increase as tolerated  -Look into joining the [employer]  -Healthy weight loss 1-2 lbs a week   -Try to eat 3 meals a day  -Fill up on non-starchy vegetables (any veggie except corn, peas, or potatoes)  -Veggies are good raw or cooked, fresh or frozen

## 2013-10-19 NOTE — Progress Notes (Signed)
  Medical Nutrition Therapy:  Appt start time: 1430 end time:  6789.   Assessment:  Primary concerns today: Michaela Little says her doctor referred her here today for obesity. She states that she would like to talk about good health. Michaela Little states that she has made some adjustments recently and has lost 12 pounds by cutting back on rice and potatoes and sodas. Gained a lot of weight during cancer treatment. She reports that her weight was 238 lbs before cancer, highest weight was 277 lbs. Goal weight 200 lbs. Has recently been making salads with kale, fruit, and sesame seeds and pumpkin seeds. She states that she has also been doing more walking. She was recently diagnosed with H.Pylori and some foods cause her to vomit. She is being treated. She reports that she usually eats "cookies by the bunches." Michaela Little is not currently working, as she is disabled. 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 She states she is thinking about volunteering at [facility]. Scheduled hysterectomy in September. Michaela Little lives alone and is on SNAP benefits. Moved to [place] from [place] in 2012. The transition "was rough in the beginning."  Preferred Learning Style:   No preference indicated   Learning Readiness:   Ready   MEDICATIONS: see list   DIETARY INTAKE:  Usual eating pattern includes 1-3 meals and "nibbles on cookies" throughout the day.    24-hr recall:  B ( AM): oatmeal or Honey Nut Cheerios or Raisin Bran OR 2 boiled eggs with 3 pieces of bacon  Snk ( AM): none  L ( PM):  Snk (3 PM): salad D ( PM):  Snk ( PM): sometimes fruit or PBJ or cookies  Beverages: "drinks water all day"   Usual physical activity: walking, plans to join the [facility]  Estimated energy needs: 1600 calories  Progress Towards Goal(s):  In progress.   Nutritional Diagnosis:  [place]-3.3 Overweight/obesity As related to excessive energy intake and physical inactivity.  As evidenced by BMI 43.    Intervention:   Nutrition counseling provided.  -Continue to walk, and increase as tolerated  -Look into joining the [place] weight loss 1-2 lbs a week   -Try to eat 3 meals a day  -Fill up on non-starchy vegetables (any veggie except corn, peas, or potatoes)  -Veggies are good raw or cooked, fresh or frozen  Teaching Method Utilized:  Auditory   Barriers to learning/adherence to lifestyle change: financial, transportation, arthritis  Demonstrated degree of understanding via:  Teach Back   Monitoring/Evaluation:  Dietary intake, exercise, and body weight in 5 week(s).

## 2013-11-19 ENCOUNTER — Emergency Department (HOSPITAL_COMMUNITY)
Admission: EM | Admit: 2013-11-19 | Discharge: 2013-11-19 | Disposition: A | Discharge status: Home / Self Care | Payer: Medicare Other | Attending: Emergency Medicine | Admitting: Emergency Medicine

## 2013-11-19 ENCOUNTER — Encounter (HOSPITAL_COMMUNITY): Payer: Self-pay | Admitting: Emergency Medicine

## 2013-11-19 ENCOUNTER — Emergency Department (HOSPITAL_COMMUNITY): Payer: Medicare Other

## 2013-11-19 DIAGNOSIS — R112 Nausea with vomiting, unspecified: Secondary | ICD-10-CM | POA: Diagnosis not present

## 2013-11-19 DIAGNOSIS — Z8619 Personal history of other infectious and parasitic diseases: Secondary | ICD-10-CM | POA: Insufficient documentation

## 2013-11-19 DIAGNOSIS — Z8742 Personal history of other diseases of the female genital tract: Secondary | ICD-10-CM | POA: Diagnosis not present

## 2013-11-19 DIAGNOSIS — Z8739 Personal history of other diseases of the musculoskeletal system and connective tissue: Secondary | ICD-10-CM | POA: Insufficient documentation

## 2013-11-19 DIAGNOSIS — Z79899 Other long term (current) drug therapy: Secondary | ICD-10-CM | POA: Insufficient documentation

## 2013-11-19 DIAGNOSIS — I10 Essential (primary) hypertension: Secondary | ICD-10-CM | POA: Insufficient documentation

## 2013-11-19 DIAGNOSIS — G4733 Obstructive sleep apnea (adult) (pediatric): Secondary | ICD-10-CM | POA: Diagnosis not present

## 2013-11-19 DIAGNOSIS — R109 Unspecified abdominal pain: Secondary | ICD-10-CM | POA: Diagnosis present

## 2013-11-19 DIAGNOSIS — Z91040 Latex allergy status: Secondary | ICD-10-CM | POA: Diagnosis not present

## 2013-11-19 DIAGNOSIS — Z853 Personal history of malignant neoplasm of breast: Secondary | ICD-10-CM | POA: Insufficient documentation

## 2013-11-19 DIAGNOSIS — Z8719 Personal history of other diseases of the digestive system: Secondary | ICD-10-CM | POA: Diagnosis not present

## 2013-11-19 DIAGNOSIS — Z862 Personal history of diseases of the blood and blood-forming organs and certain disorders involving the immune mechanism: Secondary | ICD-10-CM | POA: Insufficient documentation

## 2013-11-19 DIAGNOSIS — Z87891 Personal history of nicotine dependence: Secondary | ICD-10-CM | POA: Diagnosis not present

## 2013-11-19 DIAGNOSIS — R059 Cough, unspecified: Secondary | ICD-10-CM | POA: Diagnosis not present

## 2013-11-19 DIAGNOSIS — Z8639 Personal history of other endocrine, nutritional and metabolic disease: Secondary | ICD-10-CM | POA: Insufficient documentation

## 2013-11-19 DIAGNOSIS — Z9981 Dependence on supplemental oxygen: Secondary | ICD-10-CM | POA: Diagnosis not present

## 2013-11-19 DIAGNOSIS — Z8659 Personal history of other mental and behavioral disorders: Secondary | ICD-10-CM | POA: Insufficient documentation

## 2013-11-19 DIAGNOSIS — R1013 Epigastric pain: Secondary | ICD-10-CM | POA: Diagnosis not present

## 2013-11-19 DIAGNOSIS — C50919 Malignant neoplasm of unspecified site of unspecified female breast: Secondary | ICD-10-CM | POA: Diagnosis not present

## 2013-11-19 DIAGNOSIS — N390 Urinary tract infection, site not specified: Secondary | ICD-10-CM | POA: Insufficient documentation

## 2013-11-19 DIAGNOSIS — G8929 Other chronic pain: Secondary | ICD-10-CM | POA: Insufficient documentation

## 2013-11-19 DIAGNOSIS — R05 Cough: Secondary | ICD-10-CM | POA: Diagnosis not present

## 2013-11-19 DIAGNOSIS — J45909 Unspecified asthma, uncomplicated: Secondary | ICD-10-CM | POA: Diagnosis not present

## 2013-11-19 LAB — I-STAT CHEM 8, ED
BUN: 8 mg/dL (ref 6–23)
CREATININE: 0.7 mg/dL (ref 0.50–1.10)
Calcium, Ion: 1.14 mmol/L (ref 1.12–1.23)
Chloride: 106 mEq/L (ref 96–112)
Glucose, Bld: 98 mg/dL (ref 70–99)
HEMATOCRIT: 47 % — AB (ref 36.0–46.0)
HEMOGLOBIN: 16 g/dL — AB (ref 12.0–15.0)
POTASSIUM: 3.8 meq/L (ref 3.7–5.3)
Sodium: 140 mEq/L (ref 137–147)
TCO2: 27 mmol/L (ref 0–100)

## 2013-11-19 LAB — URINALYSIS, ROUTINE W REFLEX MICROSCOPIC
Glucose, UA: NEGATIVE mg/dL
Hgb urine dipstick: NEGATIVE
Ketones, ur: 15 mg/dL — AB
NITRITE: NEGATIVE
Protein, ur: NEGATIVE mg/dL
SPECIFIC GRAVITY, URINE: 1.03 (ref 1.005–1.030)
UROBILINOGEN UA: 1 mg/dL (ref 0.0–1.0)
pH: 5.5 (ref 5.0–8.0)

## 2013-11-19 LAB — COMPREHENSIVE METABOLIC PANEL
ALBUMIN: 3.9 g/dL (ref 3.5–5.2)
ALT: 8 U/L (ref 0–35)
ANION GAP: 13 (ref 5–15)
AST: 16 U/L (ref 0–37)
Alkaline Phosphatase: 126 U/L — ABNORMAL HIGH (ref 39–117)
BUN: 10 mg/dL (ref 6–23)
CALCIUM: 9.3 mg/dL (ref 8.4–10.5)
CO2: 26 mEq/L (ref 19–32)
Chloride: 102 mEq/L (ref 96–112)
Creatinine, Ser: 0.72 mg/dL (ref 0.50–1.10)
GFR calc Af Amer: >90 mL/min (ref >90)
GFR calc non Af Amer: >90 mL/min (ref >90)
Glucose, Bld: 91 mg/dL (ref 70–99)
Potassium: 4.4 mEq/L (ref 3.7–5.3)
SODIUM: 141 meq/L (ref 137–147)
TOTAL PROTEIN: 7.3 g/dL (ref 6.0–8.3)
Total Bilirubin: 1.4 mg/dL — ABNORMAL HIGH (ref 0.3–1.2)

## 2013-11-19 LAB — CBC WITH DIFFERENTIAL/PLATELET
BASOS PCT: 0 % (ref 0–1)
Basophils Absolute: 0 10*3/uL (ref 0.0–0.1)
Eosinophils Absolute: 0.2 10*3/uL (ref 0.0–0.7)
Eosinophils Relative: 4 % (ref 0–5)
HCT: 41.8 % (ref 36.0–46.0)
Hemoglobin: 14 g/dL (ref 12.0–15.0)
LYMPHS ABS: 1.5 10*3/uL (ref 0.7–4.0)
Lymphocytes Relative: 32 % (ref 12–46)
MCH: 26.9 pg (ref 26.0–34.0)
MCHC: 33.5 g/dL (ref 30.0–36.0)
MCV: 80.4 fL (ref 78.0–100.0)
Monocytes Absolute: 0.4 10*3/uL (ref 0.1–1.0)
Monocytes Relative: 8 % (ref 3–12)
Neutro Abs: 2.6 10*3/uL (ref 1.7–7.7)
Neutrophils Relative %: 56 % (ref 43–77)
Platelets: 233 10*3/uL (ref 150–400)
RBC: 5.2 MIL/uL — AB (ref 3.87–5.11)
RDW: 14 % (ref 11.5–15.5)
WBC: 4.6 10*3/uL (ref 4.0–10.5)

## 2013-11-19 LAB — URINE MICROSCOPIC-ADD ON

## 2013-11-19 LAB — TROPONIN I

## 2013-11-19 LAB — LIPASE, BLOOD: Lipase: 19 U/L (ref 11–59)

## 2013-11-19 MED ORDER — ONDANSETRON 4 MG PO TBDP: 4.0000 mg | ORAL_TABLET | Freq: Three times a day (TID) | ORAL | Status: DC | PRN

## 2013-11-19 MED ORDER — SODIUM CHLORIDE 0.9 % IV BOLUS (SEPSIS)
1000.0000 mL | Freq: Once | INTRAVENOUS | Status: AC
  Administered 2013-11-19: 1000 mL via INTRAVENOUS

## 2013-11-19 MED ORDER — CEPHALEXIN 500 MG PO CAPS: 500.0000 mg | ORAL_CAPSULE | Freq: Two times a day (BID) | ORAL | Status: DC

## 2013-11-19 MED ORDER — METOCLOPRAMIDE HCL 5 MG/ML IJ SOLN: 10.0000 mg | Freq: Once | INTRAMUSCULAR | Status: DC

## 2013-11-19 MED ORDER — METOCLOPRAMIDE HCL 5 MG/ML IJ SOLN
10.0000 mg | Freq: Once | INTRAMUSCULAR | Status: AC
  Administered 2013-11-19: 10 mg via INTRAVENOUS
  Filled 2013-11-19: qty 2

## 2013-11-19 MED ORDER — DIPHENHYDRAMINE HCL 50 MG/ML IJ SOLN: 25.0000 mg | Freq: Once | INTRAMUSCULAR | Status: DC

## 2013-11-19 MED ORDER — CEFTRIAXONE SODIUM 1 G IJ SOLR
1.0000 g | Freq: Once | INTRAMUSCULAR | Status: AC
  Administered 2013-11-19: 1 g via INTRAVENOUS
  Filled 2013-11-19: qty 10

## 2013-11-19 MED ORDER — DIPHENHYDRAMINE HCL 50 MG/ML IJ SOLN
25.0000 mg | Freq: Once | INTRAMUSCULAR | Status: AC
  Administered 2013-11-19: 25 mg via INTRAVENOUS
  Filled 2013-11-19: qty 1

## 2013-11-19 NOTE — ED Notes (Signed)
Pt reports approx 1.5 weeks of upper abdominal pain. States there is a mass she can palpate in her upper abdomen. Reports 1 episode of vomiting this morning which appeared to be food. Denies fever. Denies diarrhea. States 3 days of dry nonproductive cough. States some dizziness. Also states hasnt slept in 10 days. Pt awake, alert, oriented x4, NAD at present. Stroke scale neg.

## 2013-11-19 NOTE — ED Provider Notes (Signed)
CSN: [id number]     Arrival date & time 11/19/13  1210 History   First MD Initiated Contact with Patient 11/19/13 1316     Chief Complaint  Patient presents with  . Abdominal Pain     (Consider location/radiation/quality/duration/timing/severity/associated sxs/prior Treatment) HPI Comments: Patient is a 53 year old female past medical history significant for chronic abdominal pain, hyperlipidemia, hypertension, uterine fibroids, history of breast cancer, anxiety, headaches presented to the emergency department for 7-8/2 weeks of umbilical "Mass" with pain, nausea, 1-2 episodes of nonbloody nonbilious emesis today. Patient states this mouse with the only new symptom from previous chronic abdominal pain episodes. Patient also states she has not slept in 10 days. No alleviating or artery factors for either complaint. Denies trying any medications for either complaint. Patient states she's had issues with sleep in the past, she has not been using her CPAP machine at night for her sleep apnea. Denies any SI, HI, hallucinations, alcohol or recreational drug use.  Patient is a 53 y.o. female presenting with abdominal pain.  Abdominal Pain Associated symptoms: nausea and vomiting     Past Medical History  Diagnosis Date  . Uterine fibroid   . Hyperlipidemia   . Hypertension   . Asthma   . Seasonal allergies   . Anxiety   . Headache(784.0)   . History of breast cancer     2010--  LEFT  s/p  mastectomy (in [place]) AND CHEMORADIATION--  NO RECURRENCE  . GERD (gastroesophageal reflux disease)   . Cervical stenosis (uterine cervix)   . Arthritis     hands, knees, hips  . Positive H. pylori test     08-05-2013  . Pelvic pain in female   . History of cervical dysplasia   . OSA (obstructive sleep apnea) moderate osa per study  09/2010    CPAP  , NOT USING ON REGULAR BASIS  . Refusal of blood transfusions as patient is Jehovah's Witness   . Dyspnea on exertion   . Wears glasses   . History of TB  skin testing     AS TEEN--  [place] W/ MEDS   Past Surgical History  Procedure Laterality Date  . Eye surgery  1961    right  . Cervical conization w/bx  2012   in  [place]  . Colonoscopy  2011    normal per patient - [place]  . Cholecystectomy N/A 05/14/2012    Procedure: LAPAROSCOPIC CHOLECYSTECTOMY;  Surgeon: Ralene Ok, MD;  Location: [facility];  Service: General;  Laterality: N/A;  . Dilation and curettage of uterus  10/15/2011    Procedure: DILATATION AND CURETTAGE;  Surgeon: Melina Schools, MD;  Location: [facility];  Service: Gynecology;  Laterality: N/A;  Conization &  endocervical curettings  . Cardiovascular stress test  10-29-2012    low risk perfusion study/  no significant reversibity/ ef 66%/  normal wall motion  . Transthoracic echocardiogram  10-29-2012    mild lvh/  ef 60-65%/  grade II diastolic dysfunction/  trivial mr  &  tr  . Mastectomy Left 11/2008  in [place]   . Examination under anesthesia N/A 08/20/2013    Procedure: EXAM UNDER ANESTHESIA;  Surgeon: Margarette Asal, MD;  Location: [facility];  Service: Gynecology;  Laterality: N/A;   Family History  Problem Relation Age of Onset  . Breast cancer Mother   . Colon cancer Mother   . Hypotension Mother   . Asthma Mother   .  Diabetes type II Mother   . Arthritis Mother   . Clotting disorder Mother   . Cancer Mother     breast/colon  . Mental illness Brother   . Heart disease Brother   . Cerebral palsy Daughter   . Emphysema Brother     never smoker  . Colon cancer Maternal Aunt   . Cancer Maternal Aunt     colon  . Colon cancer Maternal Uncle   . Cancer Maternal Uncle     colon  . Esophageal cancer Neg Hx   . Rectal cancer Neg Hx   . Stomach cancer Neg Hx   . Thyroid disease Neg Hx    History  Substance Use Topics  . Smoking status: Former Smoker -- 0.30 packs/day for 10 years    Types: Cigarettes    Quit date: 03/26/1988  . Smokeless tobacco: Never Used  . Alcohol Use: No    OB History   Grav Para Term Preterm Abortions TAB SAB Ect Mult Living   10 5 5  5 2 3   5      Review of Systems  Gastrointestinal: Positive for nausea, vomiting and abdominal pain.  Psychiatric/Behavioral: Positive for sleep disturbance.  All other systems reviewed and are negative.     Allergies  Adhesive; Calcium carbonate antacid; Hctz; Latex; and Lisinopril  Home Medications   Prior to Admission medications   Medication Sig Start Date End Date Taking? Authorizing Provider  amLODipine (NORVASC) 10 MG tablet Take 10 mg by mouth every morning. 03/25/13  Yes Mahima Bubba Camp, MD  anastrozole (ARIMIDEX) 1 MG tablet Take 1 mg by mouth daily.   Yes Historical Provider, MD  loratadine (CLARITIN) 10 MG tablet Take one tablet by mouth once daily as needed for allergies 08/24/13  Yes Estill Dooms, MD  albuterol (VENTOLIN HFA) 108 (90 BASE) MCG/ACT inhaler Inhale 2 puffs into the lungs every 4 (four) hours as needed for wheezing or shortness of breath.     Historical Provider, MD  cephALEXin (KEFLEX) 500 MG capsule Take 1 capsule (500 mg total) by mouth 2 (two) times daily. 11/19/13   Emiyah Spraggins L Clytie Shetley, PA-C  ondansetron (ZOFRAN ODT) 4 MG disintegrating tablet Take 1 tablet (4 mg total) by mouth every 8 (eight) hours as needed for nausea or vomiting. 11/19/13   Anderson Malta L Jodie Cavey, PA-C   BP 151/78  Pulse 68  Temp(Src) 98.3 F (36.8 C) (Oral)  Resp 15  Wt 242 lb 14.4 oz (110.179 kg)  SpO2 99%  LMP 02/25/2009 Physical Exam  Nursing note and vitals reviewed. Constitutional: She is oriented to person, place, and time. She appears well-developed and well-nourished. No distress.  HENT:  Head: Normocephalic and atraumatic.  Right Ear: External ear normal.  Left Ear: External ear normal.  Nose: Nose normal.  Mouth/Throat: Oropharynx is clear and moist.  Eyes: Conjunctivae are normal.  Neck: Normal range of motion. Neck supple.  Cardiovascular: Normal rate, regular rhythm and  normal heart sounds.   Pulmonary/Chest: Effort normal and breath sounds normal.  Abdominal: Soft. Normal appearance and bowel sounds are normal. She exhibits no distension and no mass. There is no tenderness (with distraction). There is no rigidity, no rebound, no guarding and no CVA tenderness.  Musculoskeletal: Normal range of motion.  Neurological: She is alert and oriented to person, place, and time.  Skin: Skin is warm and dry. She is not diaphoretic.  Psychiatric: She has a normal mood and affect. Her speech is normal and behavior is  normal. She expresses no homicidal and no suicidal ideation. She expresses no suicidal plans and no homicidal plans.    ED Course  Procedures (including critical care time) Medications  diphenhydrAMINE (BENADRYL) injection 25 mg (25 mg Intravenous Given 11/19/13 1445)  cefTRIAXone (ROCEPHIN) 1 g in dextrose 5 % 50 mL IVPB (0 g Intravenous Stopped 11/19/13 1448)  metoCLOPramide (REGLAN) injection 10 mg (10 mg Intravenous Given 11/19/13 1445)  sodium chloride 0.9 % bolus 1,000 mL (0 mLs Intravenous Stopped 11/19/13 1542)    Labs Review Labs Reviewed  CBC WITH DIFFERENTIAL - Abnormal; Notable for the following:    RBC 5.20 (*)    All other components within normal limits  URINALYSIS, ROUTINE W REFLEX MICROSCOPIC - Abnormal; Notable for the following:    Color, Urine ORANGE (*)    APPearance CLOUDY (*)    Bilirubin Urine SMALL (*)    Ketones, ur 15 (*)    Leukocytes, UA SMALL (*)    All other components within normal limits  COMPREHENSIVE METABOLIC PANEL - Abnormal; Notable for the following:    Alkaline Phosphatase 126 (*)    Total Bilirubin 1.4 (*)    All other components within normal limits  URINE MICROSCOPIC-ADD ON - Abnormal; Notable for the following:    Bacteria, UA MANY (*)    All other components within normal limits  I-STAT CHEM 8, ED - Abnormal; Notable for the following:    Hemoglobin 16.0 (*)    HCT 47.0 (*)    All other components  within normal limits  LIPASE, BLOOD  TROPONIN I    Imaging Review Dg Abd Acute W/chest  11/19/2013   CLINICAL DATA:  Breast cancer.  Epigastric pain.  Cough.  EXAM: ACUTE ABDOMEN SERIES (ABDOMEN 2 VIEW & CHEST 1 VIEW)  COMPARISON:  Radiographs and CT abdomen and pelvis 07/30/2013.  FINDINGS: The heart size is normal.  The lungs are clear.  Supine and upright views of the abdomen demonstrate a nonspecific bowel gas pattern. There is no evidence for obstruction or free air. Mild degenerative changes are noted in the lower lumbar spine. Surgical clips are present the gallbladder fossa. A left breast expander is in place following mastectomy.  IMPRESSION: 1. No acute abnormality of the chest or abdomen. 2. Postsurgical changes of the left breast. 3. Mild degenerative changes in the lower lumbar spine.   Electronically Signed   By: Lawrence Santiago M.D.   On: 11/19/2013 14:40     EKG Interpretation None      MDM   Final diagnoses:  UTI (lower urinary tract infection)  Chronic abdominal pain    Filed Vitals:   11/19/13 1528  BP: 151/78  Pulse:   Temp:   Resp: 15     Afebrile, NAD, non-toxic appearing, AAOx4.  I have reviewed nursing notes, vital signs, and all appropriate lab and imaging results for this patient. Abdomen soft, nontender, nondistended. No peritoneal signs. No masses appreciated. No CVA tenderness. No evidence of altered mental status with associated lack of sleep. UTI noted, given dose of IV Rocephin in the emergency department, discharged on antibiotics. Advised PCP f/u. Return precautions discussed. Patient is agreeable to plan. Patient is stable at time of discharge.    Harlow Mares, PA-C 11/19/13 1612

## 2013-11-19 NOTE — Discharge Instructions (Signed)
Please follow up with your primary care physician in 1-2 days. If you do not have one please call the [place] number listed above. Please take your antibiotic until completion. Please read all discharge instructions and return precautions.   Urinary Tract Infection Urinary tract infections (UTIs) can develop anywhere along your urinary tract. Your urinary tract is your body's drainage system for removing wastes and extra water. Your urinary tract includes two kidneys, two ureters, a bladder, and a urethra. Your kidneys are a pair of bean-shaped organs. Each kidney is about the size of your fist. They are located below your ribs, one on each side of your spine. CAUSES Infections are caused by microbes, which are microscopic organisms, including fungi, viruses, and bacteria. These organisms are so small that they can only be seen through a microscope. Bacteria are the microbes that most commonly cause UTIs. SYMPTOMS  Symptoms of UTIs may vary by age and gender of the patient and by the location of the infection. Symptoms in young women typically include a frequent and intense urge to urinate and a painful, burning feeling in the bladder or urethra during urination. Older women and men are more likely to be tired, shaky, and weak and have muscle aches and abdominal pain. A fever may mean the infection is in your kidneys. Other symptoms of a kidney infection include pain in your back or sides below the ribs, nausea, and vomiting. DIAGNOSIS To diagnose a UTI, your caregiver will ask you about your symptoms. Your caregiver also will ask to provide a urine sample. The urine sample will be tested for bacteria and white blood cells. White blood cells are made by your body to help fight infection. TREATMENT  Typically, UTIs can be treated with medication. Because most UTIs are caused by a bacterial infection, they usually can be treated with the use of antibiotics. The choice of antibiotic  and length of treatment depend on your symptoms and the type of bacteria causing your infection. HOME CARE INSTRUCTIONS  If you were prescribed antibiotics, take them exactly as your caregiver instructs you. Finish the medication even if you feel better after you have only taken some of the medication.  Drink enough water and fluids to keep your urine clear or pale yellow.  Avoid caffeine, tea, and carbonated beverages. They tend to irritate your bladder.  Empty your bladder often. Avoid holding urine for long periods of time.  Empty your bladder before and after sexual intercourse.  After a bowel movement, women should cleanse from front to back. Use each tissue only once. SEEK MEDICAL CARE IF:   You have back pain.  You develop a fever.  Your symptoms do not begin to resolve within 3 days. SEEK IMMEDIATE MEDICAL CARE IF:   You have severe back pain or lower abdominal pain.  You develop chills.  You have nausea or vomiting.  You have continued burning or discomfort with urination. MAKE SURE YOU:   Understand these instructions.  Will watch your condition.  Will get help right away if you are not doing well or get worse. Document Released: 12/20/2004 Document Revised: 09/11/2011 Document Reviewed: 04/20/2011 [facility] Patient Information 2015 [place], [place]. This information is not intended to replace advice given to you by your health care provider. Make sure you discuss any questions you have with your health care provider.   Abdominal Pain Many things can cause abdominal pain. Usually, abdominal pain is not caused by a disease and will improve without  treatment. It can often be observed and treated at home. Your health care provider will do a physical exam and possibly order blood tests and X-rays to help determine the seriousness of your pain. However, in many cases, more time must pass before a clear cause of the pain can be found. Before that point, your health care  provider may not know if you need more testing or further treatment. HOME CARE INSTRUCTIONS  Monitor your abdominal pain for any changes. The following actions may help to alleviate any discomfort you are experiencing:  Only take over-the-counter or prescription medicines as directed by your health care provider.  Do not take laxatives unless directed to do so by your health care provider.  Try a clear liquid diet (broth, tea, or water) as directed by your health care provider. Slowly move to a bland diet as tolerated. SEEK MEDICAL CARE IF:  You have unexplained abdominal pain.  You have abdominal pain associated with nausea or diarrhea.  You have pain when you urinate or have a bowel movement.  You experience abdominal pain that wakes you in the night.  You have abdominal pain that is worsened or improved by eating food.  You have abdominal pain that is worsened with eating fatty foods.  You have a fever. SEEK IMMEDIATE MEDICAL CARE IF:   Your pain does not go away within 2 hours.  You keep throwing up (vomiting).  Your pain is felt only in portions of the abdomen, such as the right side or the left lower portion of the abdomen.  You pass bloody or black tarry stools. MAKE SURE YOU:  Understand these instructions.   Will watch your condition.   Will get help right away if you are not doing well or get worse.  Document Released: 12/20/2004 Document Revised: 03/17/2013 Document Reviewed: 11/19/2012 [facility] Patient Information 2015 [place], [place]. This information is not intended to replace advice given to you by your health care provider. Make sure you discuss any questions you have with your health care provider.

## 2013-11-19 NOTE — ED Provider Notes (Signed)
Medical screening examination/treatment/procedure(s) were performed by non-physician practitioner and as supervising physician I was immediately available for consultation/collaboration.   EKG Interpretation None        Ernestina Patches, MD 11/19/13 1709

## 2013-11-23 ENCOUNTER — Ambulatory Visit: Payer: Medicare Other | Admitting: Dietician

## 2013-12-03 ENCOUNTER — Other Ambulatory Visit (HOSPITAL_COMMUNITY): Payer: Medicare Other

## 2013-12-13 ENCOUNTER — Emergency Department (HOSPITAL_COMMUNITY)
Admission: EM | Admit: 2013-12-13 | Discharge: 2013-12-14 | Disposition: A | Discharge status: Home / Self Care | Payer: Medicare Other | Attending: Emergency Medicine | Admitting: Emergency Medicine

## 2013-12-13 ENCOUNTER — Encounter (HOSPITAL_COMMUNITY): Payer: Self-pay | Admitting: Emergency Medicine

## 2013-12-13 DIAGNOSIS — Z8639 Personal history of other endocrine, nutritional and metabolic disease: Secondary | ICD-10-CM | POA: Diagnosis not present

## 2013-12-13 DIAGNOSIS — R5383 Other fatigue: Secondary | ICD-10-CM | POA: Diagnosis not present

## 2013-12-13 DIAGNOSIS — J029 Acute pharyngitis, unspecified: Secondary | ICD-10-CM | POA: Insufficient documentation

## 2013-12-13 DIAGNOSIS — J45909 Unspecified asthma, uncomplicated: Secondary | ICD-10-CM | POA: Insufficient documentation

## 2013-12-13 DIAGNOSIS — I10 Essential (primary) hypertension: Secondary | ICD-10-CM | POA: Diagnosis not present

## 2013-12-13 DIAGNOSIS — R221 Localized swelling, mass and lump, neck: Secondary | ICD-10-CM

## 2013-12-13 DIAGNOSIS — Z8739 Personal history of other diseases of the musculoskeletal system and connective tissue: Secondary | ICD-10-CM | POA: Insufficient documentation

## 2013-12-13 DIAGNOSIS — Z8742 Personal history of other diseases of the female genital tract: Secondary | ICD-10-CM | POA: Diagnosis not present

## 2013-12-13 DIAGNOSIS — R5381 Other malaise: Secondary | ICD-10-CM | POA: Insufficient documentation

## 2013-12-13 DIAGNOSIS — Z8659 Personal history of other mental and behavioral disorders: Secondary | ICD-10-CM | POA: Diagnosis not present

## 2013-12-13 DIAGNOSIS — R109 Unspecified abdominal pain: Secondary | ICD-10-CM | POA: Diagnosis not present

## 2013-12-13 DIAGNOSIS — R220 Localized swelling, mass and lump, head: Secondary | ICD-10-CM | POA: Insufficient documentation

## 2013-12-13 DIAGNOSIS — Z87891 Personal history of nicotine dependence: Secondary | ICD-10-CM | POA: Diagnosis not present

## 2013-12-13 DIAGNOSIS — Z9981 Dependence on supplemental oxygen: Secondary | ICD-10-CM | POA: Insufficient documentation

## 2013-12-13 DIAGNOSIS — Z862 Personal history of diseases of the blood and blood-forming organs and certain disorders involving the immune mechanism: Secondary | ICD-10-CM | POA: Insufficient documentation

## 2013-12-13 DIAGNOSIS — C8291 Follicular lymphoma, unspecified, lymph nodes of head, face, and neck: Secondary | ICD-10-CM | POA: Diagnosis not present

## 2013-12-13 DIAGNOSIS — Z91040 Latex allergy status: Secondary | ICD-10-CM | POA: Insufficient documentation

## 2013-12-13 DIAGNOSIS — Z8619 Personal history of other infectious and parasitic diseases: Secondary | ICD-10-CM | POA: Diagnosis not present

## 2013-12-13 DIAGNOSIS — R6889 Other general symptoms and signs: Secondary | ICD-10-CM | POA: Diagnosis not present

## 2013-12-13 DIAGNOSIS — Z853 Personal history of malignant neoplasm of breast: Secondary | ICD-10-CM | POA: Diagnosis not present

## 2013-12-13 DIAGNOSIS — G4733 Obstructive sleep apnea (adult) (pediatric): Secondary | ICD-10-CM | POA: Insufficient documentation

## 2013-12-13 DIAGNOSIS — Z7729 Contact with and (suspected ) exposure to other hazardous substances: Secondary | ICD-10-CM

## 2013-12-13 DIAGNOSIS — Z79899 Other long term (current) drug therapy: Secondary | ICD-10-CM | POA: Diagnosis not present

## 2013-12-13 DIAGNOSIS — R110 Nausea: Secondary | ICD-10-CM | POA: Diagnosis not present

## 2013-12-13 DIAGNOSIS — R209 Unspecified disturbances of skin sensation: Secondary | ICD-10-CM | POA: Diagnosis not present

## 2013-12-13 LAB — CARBOXYHEMOGLOBIN
Carboxyhemoglobin: 1.4 % (ref 0.5–1.5)
METHEMOGLOBIN: 2 % — AB (ref 0.0–1.5)
O2 Saturation: 98.5 %
Total hemoglobin: 14.4 g/dL (ref 12.0–16.0)
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
## 2013-12-14 NOTE — Discharge Instructions (Signed)
Carboxyhemoglobin, COHb °This is a blood test used to determine carbon monoxide poisoning. It measures the amount of serum COHb, which is formed when the hemoglobin (oxygen carrying part of the blood) combines with carbon monoxide.  °PREPARATION FOR TEST °No preparation or fasting is necessary. °NORMAL FINDINGS °· Adults: <2.3% (0.023) °· Adult smokers: 2.1% - 4.2% (0.021 - 0.042) °· Adult heavy smokers (more than 2 packs/day): 8% - 9% (0.08 - 0.09) °· Hemolytic anemia: Up to 4% °¨ Newborn: greater than 12% °Ranges for normal findings may vary among different laboratories and hospitals. You should always check with your doctor after having lab work or other tests done to discuss the meaning of your test results and whether your values are considered within normal limits. °MEANING OF TEST  °Your caregiver will go over the test results with you and discuss the importance and meaning of your results, as well as treatment options and the need for additional tests if necessary. °OBTAINING THE TEST RESULTS °It is your responsibility to obtain your test results. Ask the lab or department performing the test when and how you will get your results. °Document Released: 04/03/2004 Document Revised: 06/04/2011 Document Reviewed: 02/18/2008 °ExitCare® Patient Information ©2015 [employer]. This information is not intended to replace advice given to you by your health care provider. Make sure you discuss any questions you have with your health care provider. ° °

## 2013-12-23 ENCOUNTER — Encounter: Payer: Self-pay | Admitting: Internal Medicine

## 2013-12-23 ENCOUNTER — Ambulatory Visit (INDEPENDENT_AMBULATORY_CARE_PROVIDER_SITE_OTHER): Payer: Medicare Other | Admitting: Internal Medicine

## 2013-12-23 VITALS — BP 159/92 | HR 76 | Temp 98.3°F | Resp 20 | Ht 65.5 in | Wt 256.4 lb

## 2013-12-23 DIAGNOSIS — Z659 Problem related to unspecified psychosocial circumstances: Secondary | ICD-10-CM

## 2013-12-23 DIAGNOSIS — I10 Essential (primary) hypertension: Secondary | ICD-10-CM | POA: Diagnosis not present

## 2013-12-23 DIAGNOSIS — Z711 Person with feared health complaint in whom no diagnosis is made: Secondary | ICD-10-CM | POA: Diagnosis not present

## 2013-12-23 MED ORDER — LOSARTAN POTASSIUM 25 MG PO TABS: 25.0000 mg | ORAL_TABLET | Freq: Every day | ORAL | Status: DC
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
## 2013-12-24 DIAGNOSIS — I10 Essential (primary) hypertension: Secondary | ICD-10-CM | POA: Insufficient documentation

## 2013-12-25 ENCOUNTER — Ambulatory Visit (INDEPENDENT_AMBULATORY_CARE_PROVIDER_SITE_OTHER): Payer: Medicare Other

## 2013-12-25 DIAGNOSIS — Z23 Encounter for immunization: Secondary | ICD-10-CM

## 2014-01-04 DIAGNOSIS — R102 Pelvic and perineal pain: Secondary | ICD-10-CM | POA: Diagnosis not present

## 2014-01-04 NOTE — H&P (Signed)
Michaela Little  DICTATION # [id number] CSN# [id number]   Margarette Asal, MD 01/04/2014 2:08 PM

## 2014-01-05 NOTE — H&P (Signed)
NAME:  Michaela Little, Michaela Little                    ACCOUNT NO.:  MEDICAL RECORD NO.:  [id number]  LOCATION:                                 FACILITY:  PHYSICIAN:  Ralene Bathe. Matthew Saras, M.D.DATE OF BIRTH:  1961/01/21  DATE OF ADMISSION: DATE OF DISCHARGE:                             HISTORY & PHYSICAL   CHIEF COMPLAINT:  Pelvic pain.  HISTORY OF PRESENT ILLNESS:  A 53 year old, G10, P5 postmenopausal patient who I saw originally in 07/14.  At that time, her complaint was that she was a prior patient of Dr. Ulanda Edison.  She has been treated for mastectomy for breast cancer in the past.  Her issues have been mainly revolving around continued pelvic pain.  She had a cone biopsy by Dr. Ulanda Edison on 08/13 that supposedly did not show any significant abnormality.  She has had at the same time last year ultrasound and also had CT scan.  This CT dated 08/14 at [facility] showed a few small fibroids, the largest of which was 3.5 cm.  Adnexa unremarkable.  Endometrium appeared to be about 4 mm.  Due to continued problems with pain, she was actually referred to [facility] to have their evaluation.  They have recommended D and C hysteroscopy, which was attempted by me at [facility], but due to severe cervical stenosis could not be accomplished. Phone conversation with her consulting physicians at [facility], they stated that under these circumstances that hysterectomy and BSO would be consideration to treat her continued problems.  She presents now for LAVH-BSO.  This procedure including specific risks related to bleeding, infection, adjacent organ injury, transfusion, wound infection, phlebitis, possible need for ERT; all discussed with her.  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  This was emphasized to her repeatedly and she was advised she would have to sign consent to 100% avoid blood products.  PAST MEDICAL HISTORY:  ALLERGIES:  TUMS,  LISINOPRIL, HYDROCHLOROTHIAZIDE.  CURRENT MEDICATIONS:  Albuterol, Flovent, metronidazole, cyclobenzaprine, omeprazole, promethazine.  PAST SURGICAL HISTORY:  She has had 5 vaginal deliveries, 2 TAB, 3 SAB, and left mastectomy in 2010.  FAMILY HISTORY:  Otherwise unremarkable.  PHYSICAL EXAMINATION:  VITAL SIGNS:  Temp 98.2, blood pressure 120/78. HEENT:  Unremarkable. NECK:  Supple without masses. LUNGS:  Clear. CARDIOVASCULAR:  Regular rate and rhythm without murmurs, rubs, or gallops heard. BREASTS:  Without masses. ABDOMEN:  Soft, flat, nontender. GU:  Vulva, vagina, cervix normal.  Uterus upper limit of normal size, mobile.  Adnexa negative.  Exam difficult due to her weight of 261. EXTREMITIES:  Unremarkable. NEUROLOGIC:  Unremarkable.  IMPRESSION:  Chronic pelvic pain, leiomyoma.  PLAN:  LAVH-BSO, possible TAH-BSO.  Procedure and risks reviewed as above.     Deeanna Beightol M. Matthew Saras, M.D.     [facility]/MEDQ  D:  01/04/2014  T:  01/05/2014  Job:  [id number]

## 2014-01-13 ENCOUNTER — Inpatient Hospital Stay (HOSPITAL_COMMUNITY)
Admission: RE | Admit: 2014-01-13 | Discharge: 2014-01-13 | Disposition: A | Discharge status: Home / Self Care | Payer: Medicare Other | Source: Ambulatory Visit

## 2014-01-13 NOTE — Pre-Procedure Instructions (Signed)
PAT NO SHOWED FOR PAT APPT. 01/13/14 at 11:15AM, ADMITTING CALLED, NO ANSWER, BARBARA IN ADMITTING LMOM.

## 2014-01-13 NOTE — Patient Instructions (Signed)
   Your procedure is scheduled on:  Wednesday, Oct 28  Enter through the [employer] [facility] at:  6 AM Pick up the phone at the desk and dial [phone] and inform us of your arrival.  Please call this number if you have any problems the morning of surgery: [phone]  Remember: Do not eat or drink after midnight: Tuesday Take these medicines the morning of surgery with a SIP OF WATER:  Do not wear jewelry, make-up, or FINGER nail polish No metal in your hair or on your body. Do not wear lotions, powders, perfumes.  You may wear deodorant.  Do not bring valuables to the hospital. Contacts, dentures or bridgework may not be worn into surgery.  Leave suitcase in the car. After Surgery it may be brought to your room. For patients being admitted to the hospital, checkout time is 11:00am the day of discharge.

## 2014-01-14 DIAGNOSIS — D500 Iron deficiency anemia secondary to blood loss (chronic): Secondary | ICD-10-CM | POA: Diagnosis not present

## 2014-01-14 DIAGNOSIS — Z789 Other specified health status: Secondary | ICD-10-CM | POA: Insufficient documentation

## 2014-01-15 ENCOUNTER — Inpatient Hospital Stay (HOSPITAL_COMMUNITY): Admission: RE | Admit: 2014-01-15 | Payer: Medicare Other | Source: Ambulatory Visit

## 2014-01-15 DIAGNOSIS — Z9012 Acquired absence of left breast and nipple: Secondary | ICD-10-CM | POA: Insufficient documentation

## 2014-01-15 DIAGNOSIS — Z86018 Personal history of other benign neoplasm: Secondary | ICD-10-CM | POA: Insufficient documentation

## 2014-01-20 ENCOUNTER — Ambulatory Visit (HOSPITAL_COMMUNITY)
Admission: RE | Admit: 2014-01-20 | Payer: Medicare Other | Source: Ambulatory Visit | Admitting: Obstetrics and Gynecology

## 2014-01-20 ENCOUNTER — Encounter (HOSPITAL_COMMUNITY): Admission: RE | Payer: Self-pay | Source: Ambulatory Visit

## 2014-01-20 SURGERY — HYSTERECTOMY, VAGINAL, LAPAROSCOPY-ASSISTED
Anesthesia: General

## 2014-01-25 ENCOUNTER — Encounter: Payer: Self-pay | Admitting: Internal Medicine

## 2014-01-28 DIAGNOSIS — R102 Pelvic and perineal pain: Secondary | ICD-10-CM | POA: Diagnosis not present

## 2014-03-05 DIAGNOSIS — Z853 Personal history of malignant neoplasm of breast: Secondary | ICD-10-CM | POA: Diagnosis not present

## 2014-03-05 DIAGNOSIS — C50112 Malignant neoplasm of central portion of left female breast: Secondary | ICD-10-CM | POA: Diagnosis not present

## 2014-03-08 DIAGNOSIS — Z853 Personal history of malignant neoplasm of breast: Secondary | ICD-10-CM | POA: Diagnosis not present

## 2014-03-24 ENCOUNTER — Ambulatory Visit: Payer: Medicare Other | Admitting: Internal Medicine

## 2014-03-24 DIAGNOSIS — E041 Nontoxic single thyroid nodule: Secondary | ICD-10-CM | POA: Diagnosis not present

## 2014-03-24 DIAGNOSIS — I10 Essential (primary) hypertension: Secondary | ICD-10-CM | POA: Diagnosis not present

## 2014-03-24 DIAGNOSIS — C50919 Malignant neoplasm of unspecified site of unspecified female breast: Secondary | ICD-10-CM | POA: Diagnosis not present

## 2014-03-24 DIAGNOSIS — J020 Streptococcal pharyngitis: Secondary | ICD-10-CM | POA: Diagnosis not present

## 2014-03-30 ENCOUNTER — Encounter: Payer: Self-pay | Admitting: Internal Medicine

## 2014-03-30 ENCOUNTER — Ambulatory Visit: Payer: Medicare Other | Admitting: Internal Medicine

## 2014-03-31 DIAGNOSIS — Z853 Personal history of malignant neoplasm of breast: Secondary | ICD-10-CM | POA: Diagnosis not present

## 2014-03-31 DIAGNOSIS — C50912 Malignant neoplasm of unspecified site of left female breast: Secondary | ICD-10-CM | POA: Diagnosis not present

## 2014-03-31 DIAGNOSIS — N879 Dysplasia of cervix uteri, unspecified: Secondary | ICD-10-CM | POA: Diagnosis not present

## 2014-04-11 DIAGNOSIS — Z853 Personal history of malignant neoplasm of breast: Secondary | ICD-10-CM | POA: Diagnosis not present

## 2014-04-11 DIAGNOSIS — R0602 Shortness of breath: Secondary | ICD-10-CM | POA: Diagnosis not present

## 2014-04-11 DIAGNOSIS — M79662 Pain in left lower leg: Secondary | ICD-10-CM | POA: Diagnosis not present

## 2014-04-11 DIAGNOSIS — M79661 Pain in right lower leg: Secondary | ICD-10-CM | POA: Diagnosis not present

## 2014-04-11 DIAGNOSIS — E785 Hyperlipidemia, unspecified: Secondary | ICD-10-CM | POA: Diagnosis not present

## 2014-04-11 DIAGNOSIS — I10 Essential (primary) hypertension: Secondary | ICD-10-CM | POA: Diagnosis not present

## 2014-04-11 DIAGNOSIS — J45909 Unspecified asthma, uncomplicated: Secondary | ICD-10-CM | POA: Diagnosis not present

## 2014-04-11 DIAGNOSIS — R0689 Other abnormalities of breathing: Secondary | ICD-10-CM | POA: Diagnosis not present

## 2014-04-11 DIAGNOSIS — R0600 Dyspnea, unspecified: Secondary | ICD-10-CM | POA: Diagnosis not present

## 2014-04-11 DIAGNOSIS — R05 Cough: Secondary | ICD-10-CM | POA: Diagnosis not present

## 2014-04-11 DIAGNOSIS — M7989 Other specified soft tissue disorders: Secondary | ICD-10-CM | POA: Diagnosis not present

## 2014-04-14 ENCOUNTER — Other Ambulatory Visit: Payer: Self-pay | Admitting: *Deleted

## 2014-04-14 MED ORDER — LOSARTAN POTASSIUM 25 MG PO TABS: 25.0000 mg | ORAL_TABLET | Freq: Every day | ORAL | Status: DC

## 2014-04-14 MED ORDER — AMLODIPINE BESYLATE 10 MG PO TABS: 10.0000 mg | ORAL_TABLET | Freq: Every morning | ORAL | Status: DC

## 2014-04-14 NOTE — Telephone Encounter (Signed)
Patient called and stated that she is in [place] and ran out of her BP medication. Faxed medication to [facility] in [place].

## 2014-04-15 DIAGNOSIS — C50912 Malignant neoplasm of unspecified site of left female breast: Secondary | ICD-10-CM | POA: Diagnosis not present

## 2014-05-03 DIAGNOSIS — Z853 Personal history of malignant neoplasm of breast: Secondary | ICD-10-CM | POA: Diagnosis not present

## 2014-05-05 DIAGNOSIS — Z853 Personal history of malignant neoplasm of breast: Secondary | ICD-10-CM | POA: Diagnosis not present

## 2014-05-25 ENCOUNTER — Ambulatory Visit: Payer: Medicare Other | Admitting: Internal Medicine

## 2014-05-26 ENCOUNTER — Encounter: Payer: Self-pay | Admitting: Internal Medicine

## 2014-06-03 ENCOUNTER — Telehealth: Payer: Self-pay | Admitting: Internal Medicine

## 2014-06-03 NOTE — Telephone Encounter (Signed)
Michaela Little dropped off a [employer] Eligibility form to be filled out by Dr. Bubba Camp. The form was placed in the rx tray on 06/03/2014.

## 2014-06-04 NOTE — Telephone Encounter (Signed)
Placed Paperwork in folder for Dr. Bubba Camp to review and sign.

## 2014-06-08 DIAGNOSIS — Z029 Encounter for administrative examinations, unspecified: Secondary | ICD-10-CM

## 2014-06-10 ENCOUNTER — Encounter (HOSPITAL_COMMUNITY): Payer: Self-pay | Admitting: Emergency Medicine

## 2014-06-10 ENCOUNTER — Emergency Department (INDEPENDENT_AMBULATORY_CARE_PROVIDER_SITE_OTHER)
Admission: EM | Admit: 2014-06-10 | Discharge: 2014-06-10 | Disposition: A | Discharge status: Home / Self Care | Payer: Medicare Other | Source: Home / Self Care | Attending: Family Medicine | Admitting: Family Medicine

## 2014-06-10 DIAGNOSIS — F419 Anxiety disorder, unspecified: Secondary | ICD-10-CM

## 2014-06-10 DIAGNOSIS — R599 Enlarged lymph nodes, unspecified: Secondary | ICD-10-CM

## 2014-06-10 DIAGNOSIS — J302 Other seasonal allergic rhinitis: Secondary | ICD-10-CM | POA: Diagnosis not present

## 2014-06-10 DIAGNOSIS — R590 Localized enlarged lymph nodes: Secondary | ICD-10-CM

## 2014-06-10 MED ORDER — PREDNISONE 10 MG PO KIT: PACK | ORAL | Status: DC

## 2014-06-10 NOTE — ED Provider Notes (Signed)
CSN: [id number]     Arrival date & time 06/10/14  1133 History   First MD Initiated Contact with Patient 06/10/14 1240     Chief Complaint  Patient presents with  . Cyst   (Consider location/radiation/quality/duration/timing/severity/associated sxs/prior Treatment) HPI   Cysts in the neck. Started 1 year ago w/ single lesion on L posterior neck. New lesions started cropping up over past 3 weeks. Enlarging. Some are painful. Nothing makes them better or worse. Problem is constant. Developed URI symtpoms 2 weeks ago  - including cough runny nose. Deneis fevers, dysphagia, unintentional wt loss, night sweats.    Past Medical History  Diagnosis Date  . Uterine fibroid   . Hyperlipidemia   . Hypertension   . Asthma   . Seasonal allergies   . Anxiety   . Headache(784.0)   . History of breast cancer     2010--  LEFT  s/p  mastectomy (in [place]) AND CHEMORADIATION--  NO RECURRENCE  . GERD (gastroesophageal reflux disease)   . Cervical stenosis (uterine cervix)   . Arthritis     hands, knees, hips  . Positive H. pylori test     08-05-2013  . Pelvic pain in female   . History of cervical dysplasia   . OSA (obstructive sleep apnea) moderate osa per study  09/2010    CPAP  , NOT USING ON REGULAR BASIS  . Refusal of blood transfusions as patient is Jehovah's Witness   . Dyspnea on exertion   . Wears glasses   . History of TB skin testing     AS TEEN--  [place] W/ MEDS   Past Surgical History  Procedure Laterality Date  . Eye surgery  1961    right  . Cervical conization w/bx  2012   in  [place]  . Colonoscopy  2011    normal per patient - [place]  . Cholecystectomy N/A 05/14/2012    Procedure: LAPAROSCOPIC CHOLECYSTECTOMY;  Surgeon: Ralene Ok, MD;  Location: [facility];  Service: General;  Laterality: N/A;  . Dilation and curettage of uterus  10/15/2011    Procedure: DILATATION AND CURETTAGE;  Surgeon: Melina Schools, MD;  Location: [facility];  Service: Gynecology;  Laterality: N/A;  Conization &   endocervical curettings  . Cardiovascular stress test  10-29-2012    low risk perfusion study/  no significant reversibity/ ef 66%/  normal wall motion  . Transthoracic echocardiogram  10-29-2012    mild lvh/  ef 60-65%/  grade II diastolic dysfunction/  trivial mr  &  tr  . Mastectomy Left 11/2008  in [place]   . Examination under anesthesia N/A 08/20/2013    Procedure: EXAM UNDER ANESTHESIA;  Surgeon: Margarette Asal, MD;  Location: [facility];  Service: Gynecology;  Laterality: N/A;   Family History  Problem Relation Age of Onset  . Breast cancer Mother   . Colon cancer Mother   . Hypotension Mother   . Asthma Mother   . Diabetes type II Mother   . Arthritis Mother   . Clotting disorder Mother   . Cancer Mother     breast/colon  . Mental illness Brother   . Heart disease Brother   . Cerebral palsy Daughter   . Emphysema Brother     never smoker  . Colon cancer Maternal Aunt   . Cancer Maternal Aunt     colon  . Colon cancer Maternal Uncle   . Cancer Maternal  Uncle     colon  . Esophageal cancer Neg Hx   . Rectal cancer Neg Hx   . Stomach cancer Neg Hx   . Thyroid disease Neg Hx    History  Substance Use Topics  . Smoking status: Former Smoker -- 0.30 packs/day for 10 years    Types: Cigarettes    Quit date: 03/26/1988  . Smokeless tobacco: Never Used  . Alcohol Use: No   OB History    Gravida Para Term Preterm AB TAB SAB Ectopic Multiple Living   10 5 5  5 2 3   5      Review of Systems Per HPI with all other pertinent systems negative.   Allergies  Adhesive; Calcium carbonate antacid; Hctz; Latex; and Lisinopril  Home Medications   Prior to Admission medications   Medication Sig Start Date End Date Taking? Authorizing Provider  albuterol (VENTOLIN HFA) 108 (90 BASE) MCG/ACT inhaler Inhale 2 puffs into the lungs every 4 (four) hours as needed for wheezing or shortness of breath.     Historical Provider, MD  amLODipine  (NORVASC) 10 MG tablet Take 1 tablet (10 mg total) by mouth every morning. 04/14/14   Blanchie Serve, MD  anastrozole (ARIMIDEX) 1 MG tablet Take 1 mg by mouth daily.    Historical Provider, MD  loratadine (CLARITIN) 10 MG tablet Take one tablet by mouth once daily as needed for allergies 08/24/13   Estill Dooms, MD  losartan (COZAAR) 25 MG tablet Take 1 tablet (25 mg total) by mouth daily. 04/14/14   Blanchie Serve, MD  PredniSONE 10 MG KIT 12 day dose pack 06/10/14   Waldemar Dickens, MD   BP 156/85 mmHg  Pulse 70  Temp(Src) 97.7 F (36.5 C) (Oral)  Resp 16  SpO2 99%  LMP 02/25/2009 Physical Exam Physical Exam  Constitutional: oriented to person, place, and time. appears well-developed and well-nourished. Peers anxious HENT:  Single small lymph node in the posterior cervical chain no other appreciable lymph nodes. Of note the large cyst that patient identified is actually the sternoclavicular joint and is nontender to palpation Head: Normocephalic and atraumatic.  Eyes: EOMI. PERRL.  Neck: Normal range of motion.  Cardiovascular: RRR, no m/r/g, 2+ distal pulses,  Pulmonary/Chest: Effort normal and breath sounds normal. No respiratory distress.  Abdominal: Soft. Bowel sounds are normal. NonTTP, no distension.  Musculoskeletal: Normal range of motion. Non ttp, no effusion.  Neurological: alert and oriented to person, place, and time.  Skin: various areas of excoriation along the neck and arms. No rash on hands or feet. No areas of hyper or hypopigmented skin or dry patches  Psychiatric: normal mood and affect. behavior is normal. Judgment and thought content normal.   ED Course  Procedures (including critical care time) Labs Review Labs Reviewed - No data to display  Imaging Review No results found.   MDM   1. Anxiety   2. Reactive cervical nodes   3. Seasonal allergies    Patient very anxious and anticipates significant contribution of this to her presenting symptoms. Only a  single reactive node was felt although patient feels like there are several throughout her neck. Of note the most predominant cyst or noted that she was presenting complaining about was actually the notch at the sternal clavicular joint. Patient with allergic type symptoms and anticipate this is likely the cause of her itching at this point time. Patient to start Zyrtec and a prednisone Dosepak. Patient to call and follow-up with  her PCP in 1-2 weeks for further management if needed and possible node biopsy if this is truly becoming enlarged.   Precautions given and all questions answered  Linna Darner, MD Family Medicine 06/10/2014, 1:29 PM      Waldemar Dickens, MD 06/10/14 1329

## 2014-06-10 NOTE — ED Notes (Signed)
Patient has a history of knot/lymph node to left nec for a year and has been evaluated.  Patient reports she has noticed more around neck and under chin. Patient has also noticed knots in groin area.

## 2014-06-10 NOTE — Discharge Instructions (Signed)
You likely have several lymph nodes that are reactive. This may be due to an underlying allergy. Please start the steroids. Please start taking a daily allergy pill such as Zyrtec. Please call your primary care physician in follow-up in their office in 1-2 weeks. Please go to the emergency room if he gets significantly worse.

## 2014-06-16 ENCOUNTER — Ambulatory Visit (INDEPENDENT_AMBULATORY_CARE_PROVIDER_SITE_OTHER): Payer: Medicare Other | Admitting: Internal Medicine

## 2014-06-16 ENCOUNTER — Encounter: Payer: Self-pay | Admitting: Internal Medicine

## 2014-06-16 VITALS — BP 116/80 | HR 70 | Temp 97.5°F | Resp 20 | Ht 66.0 in | Wt 251.8 lb

## 2014-06-16 DIAGNOSIS — R102 Pelvic and perineal pain: Secondary | ICD-10-CM | POA: Diagnosis not present

## 2014-06-16 DIAGNOSIS — I10 Essential (primary) hypertension: Secondary | ICD-10-CM | POA: Diagnosis not present

## 2014-06-16 DIAGNOSIS — R599 Enlarged lymph nodes, unspecified: Secondary | ICD-10-CM

## 2014-06-16 DIAGNOSIS — R591 Generalized enlarged lymph nodes: Secondary | ICD-10-CM

## 2014-06-16 DIAGNOSIS — Z853 Personal history of malignant neoplasm of breast: Secondary | ICD-10-CM

## 2014-06-16 DIAGNOSIS — R450 Nervousness: Secondary | ICD-10-CM | POA: Diagnosis not present

## 2014-06-16 DIAGNOSIS — J301 Allergic rhinitis due to pollen: Secondary | ICD-10-CM | POA: Diagnosis not present

## 2014-06-16 DIAGNOSIS — J45909 Unspecified asthma, uncomplicated: Secondary | ICD-10-CM | POA: Diagnosis not present

## 2014-06-16 MED ORDER — LORATADINE 10 MG PO TABS: ORAL_TABLET | ORAL | Status: DC

## 2014-06-16 MED ORDER — AMLODIPINE BESYLATE 10 MG PO TABS: 10.0000 mg | ORAL_TABLET | Freq: Every morning | ORAL | Status: DC

## 2014-06-16 NOTE — Patient Instructions (Signed)
Low salt diet   Continue other medications as ordered  Follow up with oncology as soon as possible to follow breast cancer history  Will call with lab results  Follow up in 1 month

## 2014-06-16 NOTE — Progress Notes (Signed)
Patient ID: Michaela Little, female   DOB: 1960/06/26, 54 y.o.   MRN: [id number]    Facility  [facility]    Place of Service:   OFFICE   Allergies  Allergen Reactions  . Adhesive [Tape] Itching and Other (See Comments)    REDDNESS  . Calcium Carbonate Antacid Hives    Fruit flavored Tums   . Hctz [Hydrochlorothiazide] Itching  . Latex Itching  . Lisinopril Itching    Chief Complaint  Patient presents with  . Follow-up    Follow-up from urgent care  . Medication Management    Discuss potassium    HPI:  54 yo female seen today for f/u after visit to ER for enlarged lymph nodes and was tx with prednisone taper and zyrtec for seasonal allergy. Needs Rx for claritin. She did not pick up Rx for zyrtec. last CT scan of neck done in [place] which showed multiple enlarged lymph nodes. She has increased nervousness but does not take anything. She prays a lot and meditates. She does not take losartan as it causes her to feel "bad". She uses prn HFA for asthma sx's. No recent exacerbation of asthma.  She has a hx breast ca s/p left mastectomy/chemotx/XRT and takes arimidex (she has about 2 yrs left of tx due to her intermittent use of med). She is followed by oncology but has not seen [facility] oncologist in a while  She was told she has uterine fibroids and will need a TAH. She delayed the procedure which was originally scheduled for 11/2013. She got a 2nd opinion and was told she needed one. She c/o b/l groin lumps. She has pain with walking due to pelvic pain. She has had to have a cone bx in the past. No vaginal bleeding recently, just pain.  HTN is stable on amlodipine. She stopped her losartan.  She is a [place] native and spends several mos per year in [place]. She sees providers in [place] when she is there. She is a [employer]  Past Medical History  Diagnosis Date  . Uterine fibroid   . Hyperlipidemia   . Hypertension   . Asthma   . Seasonal allergies   . Anxiety   . Headache(784.0)   . History  of breast cancer     2010--  LEFT  s/p  mastectomy (in [place]) AND CHEMORADIATION--  NO RECURRENCE  . GERD (gastroesophageal reflux disease)   . Cervical stenosis (uterine cervix)   . Arthritis     hands, knees, hips  . Positive H. pylori test     08-05-2013  . Pelvic pain in female   . History of cervical dysplasia   . OSA (obstructive sleep apnea) moderate osa per study  09/2010    CPAP  , NOT USING ON REGULAR BASIS  . Refusal of blood transfusions as patient is Jehovah's Witness   . Dyspnea on exertion   . Wears glasses   . History of TB skin testing     [place]--  [place]   Past Surgical History  Procedure Laterality Date  . Eye surgery  1961    right  . Cervical conization w/bx  2012   in  [place]  . Colonoscopy  2011    normal per patient - [place]  . Cholecystectomy N/A 05/14/2012    Procedure: LAPAROSCOPIC CHOLECYSTECTOMY;  Surgeon: Ralene Ok, MD;  Location: [facility];  Service: General;  Laterality: N/A;  . Dilation and curettage of uterus  10/15/2011  Procedure: DILATATION AND CURETTAGE;  Surgeon: Melina Schools, MD;  Location: [facility];  Service: Gynecology;  Laterality: N/A;  Conization &  endocervical curettings  . Cardiovascular stress test  10-29-2012    low risk perfusion study/  no significant reversibity/ ef 66%/  normal wall motion  . Transthoracic echocardiogram  10-29-2012    mild lvh/  ef 60-65%/  grade II diastolic dysfunction/  trivial mr  &  tr  . Mastectomy Left 11/2008  in [place]   . Examination under anesthesia N/A 08/20/2013    Procedure: EXAM UNDER ANESTHESIA;  Surgeon: Margarette Asal, MD;  Location: [facility];  Service: Gynecology;  Laterality: N/A;   History   Social History  . Marital Status: Single    Spouse Name: N/A  . Number of Children: 5  . Years of Education: N/A   Occupational History  . Disabled    Social History Main Topics  . Smoking status: Former Smoker -- 0.30 packs/day for 10 years    Types:  Cigarettes    Quit date: 03/26/1988  . Smokeless tobacco: Never Used  . Alcohol Use: No  . Drug Use: No  . Sexual Activity: Not Currently   Other Topics Concern  . None   Social History Narrative     Medications: Patient's Medications  New Prescriptions   No medications on file  Previous Medications   ALBUTEROL (VENTOLIN HFA) 108 (90 BASE) MCG/ACT INHALER    Inhale 2 puffs into the lungs every 4 (four) hours as needed for wheezing or shortness of breath.    AMLODIPINE (NORVASC) 10 MG TABLET    Take 1 tablet (10 mg total) by mouth every morning.   ANASTROZOLE (ARIMIDEX) 1 MG TABLET    Take 1 mg by mouth daily.   LORATADINE (CLARITIN) 10 MG TABLET    Take one tablet by mouth once daily as needed for allergies   LOSARTAN (COZAAR) 25 MG TABLET    Take 1 tablet (25 mg total) by mouth daily.   PREDNISONE 10 MG KIT    12 day dose pack  Modified Medications   No medications on file  Discontinued Medications   No medications on file     Review of Systems  Constitutional: Negative for fever, chills, diaphoresis, activity change, appetite change and fatigue.       Occasional night sweats  HENT: Negative for ear pain and sore throat.   Eyes: Negative for visual disturbance.  Respiratory: Positive for shortness of breath and wheezing. Negative for cough and chest tightness.   Cardiovascular: Positive for chest pain. Negative for palpitations and leg swelling.  Gastrointestinal: Negative for nausea, vomiting, abdominal pain, diarrhea, constipation and blood in stool.  Genitourinary: Negative for dysuria.  Musculoskeletal: Positive for arthralgias.  Neurological: Positive for headaches. Negative for dizziness, tremors and numbness.  Psychiatric/Behavioral: Positive for sleep disturbance (insomnia). The patient is nervous/anxious.     Filed Vitals:   06/16/14 0828  BP: 116/80  Pulse: 70  Temp: 97.5 F (36.4 C)  TempSrc: Oral  Resp: 20  Height: _0  (1.676 m)  Weight: 251 lb  12.8 oz (114.216 kg)  SpO2: 98%   Body mass index is 40.66 kg/(m^2).  Physical Exam  Constitutional: She is oriented to person, place, and time. She appears well-developed and well-nourished.  HENT:  Mouth/Throat: Oropharynx is clear and moist. No oropharyngeal exudate.  Eyes: Pupils are equal, round, and reactive to light. No scleral  icterus.  Neck: Neck supple. No tracheal deviation present. No thyromegaly present.  Cardiovascular: Normal rate, regular rhythm, normal heart sounds and intact distal pulses.  Exam reveals no gallop and no friction rub.   No murmur heard. +1 pitting LE edema b/l. no calf TTP. No carotid bruit b/l  Pulmonary/Chest: Effort normal and breath sounds normal. No stridor. No respiratory distress. She has no wheezes. She has no rales.  Reduced BS at base b/l  Abdominal: Soft. Bowel sounds are normal. She exhibits no distension and no mass. There is tenderness (b/l lower quadrant). There is no rebound and no guarding.  Musculoskeletal: She exhibits edema and tenderness.  Right short leg; R>L ASIS TTP; right pelvic outflare with SI joint restriction; gait antalgic  Lymphadenopathy:    She has cervical adenopathy (TTP; multiple).  Neurological: She is alert and oriented to person, place, and time. She has normal reflexes.  Skin: Skin is warm and dry. No rash noted.  Psychiatric: Her behavior is normal. Judgment and thought content normal. Her mood appears anxious.     Labs reviewed: No visits with results within 3 Month(s) from this visit.   ER records reviewed. No labs drawn. She was dx with anxiety, reactive lymph nodes. She was given a prednisone taper  Assessment/Plan   ICD-9-CM ICD-10-CM   1. Lymphadenopathy of head and neck - of questionable etiology but possibly due to #2 785.6 R59.9 CBC with Differential     TSH  2. Allergic rhinitis due to pollen 477.0 J30.1 loratadine (CLARITIN) 10 MG tablet  3. Asthma, unspecified asthma severity, uncomplicated -  stable on HFA 493.90 J45.909   4. Essential hypertension - controlled on amlodipine 401.9 I10 CMP     TSH     amLODipine (NORVASC) 10 MG tablet  5. Pelvic pain in female - due to uterine fibroids 625.9 R10.2   6. History of breast cancer s/p left mastectomy/chemotx/XRT- on arimidex V10.3 Z85.3 TSH  7. Nervousness - stable by prayer/meditation 799.21 R45.0     --since she stopped her losartan, low BP sx's resolved. Recommend low salt diet  --f/u with oncology as soon as possible as her breast ca needs to be followed especially while on arimidex  --obtain records for CT head and neck. Will dtermine if she needs bx once report becomes available  --f/u with GYN for mx uterine fibroids and pelvic pain  --f/u in 1 month for re-eval  Cintya Daughety S. Perlie Gold  [facility] [address] [place],  [address] [phone] Office (Wednesdays and Fridays 8 AM - 5 PM) [phone] Cell (Monday-Friday 8 AM - 5 PM)

## 2014-06-17 LAB — CBC WITH DIFFERENTIAL/PLATELET
BASOS: 0 %
Basophils Absolute: 0 10*3/uL (ref 0.0–0.2)
EOS ABS: 0.1 10*3/uL (ref 0.0–0.4)
EOS: 1 %
HEMATOCRIT: 41.4 % (ref 34.0–46.6)
HEMOGLOBIN: 13.8 g/dL (ref 11.1–15.9)
Immature Grans (Abs): 0 10*3/uL (ref 0.0–0.1)
Immature Granulocytes: 0 %
LYMPHS ABS: 2.5 10*3/uL (ref 0.7–3.1)
Lymphs: 28 %
MCH: 27.4 pg (ref 26.6–33.0)
MCHC: 33.3 g/dL (ref 31.5–35.7)
MCV: 82 fL (ref 79–97)
MONOCYTES: 6 %
Monocytes Absolute: 0.6 10*3/uL (ref 0.1–0.9)
NEUTROS ABS: 5.7 10*3/uL (ref 1.4–7.0)
Neutrophils Relative %: 65 %
Platelets: 280 10*3/uL (ref 150–379)
RBC: 5.03 x10E6/uL (ref 3.77–5.28)
RDW: 15.3 % (ref 12.3–15.4)
WBC: 8.8 10*3/uL (ref 3.4–10.8)

## 2014-06-17 LAB — COMPREHENSIVE METABOLIC PANEL
A/G RATIO: 1.4 (ref 1.1–2.5)
ALT: 7 IU/L (ref 0–32)
AST: 7 IU/L (ref 0–40)
Albumin: 4.1 g/dL (ref 3.5–5.5)
Alkaline Phosphatase: 112 IU/L (ref 39–117)
BUN/Creatinine Ratio: 19 (ref 9–23)
BUN: 14 mg/dL (ref 6–24)
Bilirubin Total: 0.6 mg/dL (ref 0.0–1.2)
CALCIUM: 9.5 mg/dL (ref 8.7–10.2)
CHLORIDE: 101 mmol/L (ref 97–108)
CO2: 23 mmol/L (ref 18–29)
Creatinine, Ser: 0.74 mg/dL (ref 0.57–1.00)
GFR, EST AFRICAN AMERICAN: 105 mL/min/{1.73_m2} (ref >59)
GFR, EST NON AFRICAN AMERICAN: 91 mL/min/{1.73_m2} (ref >59)
GLUCOSE: 88 mg/dL (ref 65–99)
Globulin, Total: 3 g/dL (ref 1.5–4.5)
POTASSIUM: 4.3 mmol/L (ref 3.5–5.2)
Sodium: 140 mmol/L (ref 134–144)
Total Protein: 7.1 g/dL (ref 6.0–8.5)

## 2014-06-17 LAB — TSH: TSH: 2.86 u[IU]/mL (ref 0.450–4.500)

## 2014-06-21 ENCOUNTER — Telehealth: Payer: Self-pay | Admitting: *Deleted

## 2014-06-21 ENCOUNTER — Other Ambulatory Visit: Payer: Self-pay | Admitting: Oncology

## 2014-06-21 ENCOUNTER — Telehealth: Payer: Self-pay | Admitting: Oncology

## 2014-06-21 ENCOUNTER — Other Ambulatory Visit: Payer: Self-pay | Admitting: *Deleted

## 2014-06-21 NOTE — Telephone Encounter (Signed)
Patient called stating that she is having bilateral lymph node swelling and throat pain. Patient states that it has been getting worse over a 6 month time span. Patient would like a follow up appointment. Message sent to MD and Nurse

## 2014-06-21 NOTE — Telephone Encounter (Signed)
Patient called and was saying she is having swelling in neck and arm. Patient spoke with PCP and advised to speak with our office. Transferred to triage

## 2014-06-21 NOTE — Telephone Encounter (Signed)
Per chart review and MD/mid level schedules - no availability until later next week.  POF sent to schedule an appointment with symptom management per pt's and primary MD's request to be seen ASAP.

## 2014-06-23 ENCOUNTER — Ambulatory Visit (HOSPITAL_BASED_OUTPATIENT_CLINIC_OR_DEPARTMENT_OTHER): Payer: Medicare Other | Admitting: Nurse Practitioner

## 2014-06-23 ENCOUNTER — Telehealth: Payer: Self-pay | Admitting: Nurse Practitioner

## 2014-06-23 ENCOUNTER — Encounter: Payer: Self-pay | Admitting: Nurse Practitioner

## 2014-06-23 VITALS — BP 151/72 | HR 72 | Temp 98.2°F | Resp 18 | Ht 66.0 in | Wt 254.0 lb

## 2014-06-23 DIAGNOSIS — M542 Cervicalgia: Secondary | ICD-10-CM

## 2014-06-23 DIAGNOSIS — Z170 Estrogen receptor positive status [ER+]: Secondary | ICD-10-CM

## 2014-06-23 DIAGNOSIS — C773 Secondary and unspecified malignant neoplasm of axilla and upper limb lymph nodes: Secondary | ICD-10-CM | POA: Diagnosis not present

## 2014-06-23 DIAGNOSIS — R221 Localized swelling, mass and lump, neck: Secondary | ICD-10-CM

## 2014-06-23 DIAGNOSIS — C50912 Malignant neoplasm of unspecified site of left female breast: Secondary | ICD-10-CM | POA: Diagnosis not present

## 2014-06-23 NOTE — Progress Notes (Signed)
ID: Michaela Little   DOB: 17-Sep-1960  MR#: [id number]  HQR#:[id number]  PCP: Blanchie Serve, MD SU:  Ralene Ok GYN: Harrell Gave, MD OTHER MD: Crissie Reese, Silvano Rusk, Christinia Gully  CHIEF COMPLAINT: breast cancer, neck pain CURRENT TREATMENT: anastrozole   BREAST CANCER HISTORY:  Michaela Little is a [place] woman who moved to [place] in 2013 with a history of breast cancer, and establishied herself with our practice.  The patient had left breast and left axillary lymph node biopsy performed March of 2010, both positive (I do not have the pathology report at this point). She was treated neo-adjuvantly at [facility] with (a) paclitaxel weekly x12 and (b) doxorubicin/cyclophosphamide in dose dense fashion x4, both given with tifiparnib.   She then proceeded to left modified radical mastectomy 12/23/2008, the final pathology showing a 4 cm residual invasive lobular carcinoma, involving 5/22 lymph nodes sampled. The tumor was estrogen and progesterone receptor positive, HER-2 negative.   Postoperatively she received 50 gray of radiation completed January of 2011, including the left supraclavicular lymph node basin. She was then started on tamoxifen, but developed some uterine lining thickening and was switched to anastrozole in May 2012. Her subsequent history is as detailed below  INTERVAL HISTORY: Michaela Little returns today for follow up of her breast cancer and recurrent neck pain. She has a history of neck pain and even a swollen lymph node, but she feels like lately it has been worse. She was in [facility] and was diagnosed with strep throat in January, but this neck pain predates this episode, and was even documented at her last visit. She had a PET scan and head/neck CT scan performed at her previous oncologist at this time as well, but she is not sure of the results. The interval history is also remarkable for the cancellation of her hysterectomy that was planned last fall by Dr.  Matthew Saras. She says she backed out of it because she was afraid. But her fibroid pain is worse, though no bleeding. As far as her breast cancer is concerned she has been on anastrozole since May 2012 and is tolerating it well. She has some hot flashes and joint pain, but this is manageable despite her chronic bilateral knee pain.    REVIEW OF SYSTEMS: [name] denies fevers or chills. She has occasional nausea and even light vomiting at time. She is constipated. She is eating and drinking well. She is chronically short of breath, but does not use her inhaler as directed. She has a history of allergies and was recent placed on a prednisone dose pack and zyrtec. She does not sleep well. Her right breast and right abdomen are tender. A detailed review of systems is otherwise stable.   PAST MEDICAL HISTORY: Past Medical History  Diagnosis Date  . Uterine fibroid   . Hyperlipidemia   . Hypertension   . Asthma   . Seasonal allergies   . Anxiety   . Headache(784.0)   . History of breast cancer     2010--  LEFT  s/p  mastectomy (in [place]) AND CHEMORADIATION--  NO RECURRENCE  . GERD (gastroesophageal reflux disease)   . Cervical stenosis (uterine cervix)   . Arthritis     hands, knees, hips  . Positive H. pylori test     08-05-2013  . Pelvic pain in female   . History of cervical dysplasia   . OSA (obstructive sleep apnea) moderate osa per study  09/2010    CPAP  , NOT USING ON  REGULAR BASIS  . Refusal of blood transfusions as patient is Jehovah's Witness   . Dyspnea on exertion   . Wears glasses   . History of TB skin testing     AS TEEN--  [place] W/ MEDS    PAST SURGICAL HISTORY: Past Surgical History  Procedure Laterality Date  . Eye surgery  1961    right  . Cervical conization w/bx  2012   in  [place]  . Colonoscopy  2011    normal per patient - [place]  . Cholecystectomy N/A 05/14/2012    Procedure: LAPAROSCOPIC CHOLECYSTECTOMY;  Surgeon: Ralene Ok, MD;  Location: [facility];  Service: General;   Laterality: N/A;  . Dilation and curettage of uterus  10/15/2011    Procedure: DILATATION AND CURETTAGE;  Surgeon: Melina Schools, MD;  Location: [facility];  Service: Gynecology;  Laterality: N/A;  Conization &  endocervical curettings  . Cardiovascular stress test  10-29-2012    low risk perfusion study/  no significant reversibity/ ef 66%/  normal wall motion  . Transthoracic echocardiogram  10-29-2012    mild lvh/  ef 60-65%/  grade II diastolic dysfunction/  trivial mr  &  tr  . Mastectomy Left 11/2008  in [place]   . Examination under anesthesia N/A 08/20/2013    Procedure: EXAM UNDER ANESTHESIA;  Surgeon: Margarette Asal, MD;  Location: [facility];  Service: Gynecology;  Laterality: N/A;    FAMILY HISTORY Family History  Problem Relation Age of Onset  . Breast cancer Mother   . Colon cancer Mother   . Hypotension Mother   . Asthma Mother   . Diabetes type II Mother   . Arthritis Mother   . Clotting disorder Mother   . Cancer Mother     breast/colon  . Mental illness Brother   . Heart disease Brother   . Cerebral palsy Daughter   . Emphysema Brother     never smoker  . Colon cancer Maternal Aunt   . Cancer Maternal Aunt     colon  . Colon cancer Maternal Uncle   . Cancer Maternal Uncle     colon  . Esophageal cancer Neg Hx   . Rectal cancer Neg Hx   . Stomach cancer Neg Hx   . Thyroid disease Neg Hx    Gynecologic history: She is GX P5. At first pregnancy to term age 43. Menarche age 32. Last menstrual period when she had her chemotherapy.   Social History: She is disabled secondary to arthritis. She moved from [place] to [place] for "peace of mind". Of her 5 children only one daughter is living in [place], [place]. One daughter in [place] is incarcerated and another one is "in a lot of stress". The patient lives alone. She mostly reads and does computer games on her phone during the day. She has 20 grandchildren.     ADVANCED DIRECTIVES: She does not have a healthcare power of attorney.    HEALTH MAINTENANCE: History  Substance Use Topics  . Smoking status: Former Smoker -- 0.30 packs/day for 10 years    Types: Cigarettes    Quit date: 03/26/1988  . Smokeless tobacco: Never Used  . Alcohol Use: No     Colonoscopy:  PAP:  Bone density:  Lipid panel:  Allergies  Allergen Reactions  . Adhesive [Tape] Itching and Other (See Comments)    REDDNESS  . Calcium Carbonate Antacid Hives  Fruit flavored Tums   . Hctz [Hydrochlorothiazide] Itching  . Latex Itching  . Lisinopril Itching  . Losartan Potassium Other (See Comments)    Makes her feel "bad"    Current Outpatient Prescriptions  Medication Sig Dispense Refill  . amLODipine (NORVASC) 10 MG tablet Take 1 tablet (10 mg total) by mouth every morning. 90 tablet 1  . anastrozole (ARIMIDEX) 1 MG tablet Take 1 mg by mouth daily.    Marland Kitchen loratadine (CLARITIN) 10 MG tablet Take one tablet by mouth once daily as needed for allergies 90 tablet 1  . PredniSONE 10 MG KIT 12 day dose pack 1 kit 0  . albuterol (VENTOLIN HFA) 108 (90 BASE) MCG/ACT inhaler Inhale 2 puffs into the lungs every 4 (four) hours as needed for wheezing or shortness of breath.      No current facility-administered medications for this visit.    OBJECTIVE: Middle-aged African American woman   Filed Vitals:   06/23/14 1329  BP: 151/72  Pulse: 72  Temp: 98.2 F (36.8 C)  Resp: 18     Body mass index is 41.02 kg/(m^2).    ECOG FS: 1 Filed Weights   06/23/14 1329  Weight: 254 lb (115.214 kg)   Filed Vitals:   06/23/14 1329  BP: 151/72  Pulse: 72  Temp: 98.2 F (36.8 C)  Resp: 18    Skin: warm, dry  HEENT: sclerae anicteric, conjunctivae pink, oropharynx clear. No thrush or mucositis. Tenderness to bilateral cervical chain. Left posterior cervical fullness. Lymph Nodes: No cervical or supraclavicular lymphadenopathy  Lungs: clear to auscultation bilaterally, no  rales, wheezes, or rhonci  Heart: regular rate and rhythm  Abdomen: round, soft, non tender, positive bowel sounds, tenderness to upper right quadrant with deep palpation Musculoskeletal: No focal spinal tenderness, no peripheral edema  Neuro: non focal, well oriented, positive affect  Breasts: left breast status post mastectomy and expander placement. Skin taut over expander. No evidence of recurrent disease. Left axilla benign, right breast unremarkable.   LAB RESULTS: Lab Results  Component Value Date   WBC 8.8 06/16/2014   NEUTROABS 5.7 06/16/2014   HGB 13.8 06/16/2014   HCT 41.4 06/16/2014   MCV 82 06/16/2014   PLT 280 06/16/2014      Chemistry      Component Value Date/Time   NA 140 06/16/2014 0915   NA 141 11/19/2013 1324   NA 142 03/05/2013 1128   K 4.3 06/16/2014 0915   K 4.3 03/05/2013 1128   CL 101 06/16/2014 0915   CL 105 07/07/2012 1327   CO2 23 06/16/2014 0915   CO2 27 03/05/2013 1128   BUN 14 06/16/2014 0915   BUN 10 11/19/2013 1324   BUN 12.0 03/05/2013 1128   CREATININE 0.74 06/16/2014 0915   CREATININE 0.7 03/05/2013 1128      Component Value Date/Time   CALCIUM 9.5 06/16/2014 0915   CALCIUM 9.5 03/05/2013 1128   ALKPHOS 112 06/16/2014 0915   ALKPHOS 133 03/05/2013 1128   AST 7 06/16/2014 0915   AST 22 03/05/2013 1128   ALT 7 06/16/2014 0915   ALT 16 03/05/2013 1128   BILITOT 0.6 06/16/2014 0915   BILITOT 1.4* 11/19/2013 1324   BILITOT 0.89 03/05/2013 1128       Lab Results  Component Value Date   LABCA2 19 01/17/2012     STUDIES: Right mammography at [facility] 07/10/2012 was unremarkable. Bone density scan at [facility] 08/06/2012 was normal, with a T score of 0.6  54 y.o.  [place] woman relocated here from [place],    (1)  left-sided breast cancer diagnosed March of 2010 and treated neo-adjuvantly at [facility] with (a) paclitaxel weekly x12 and (b) doxorubicin/ cyclophosphamide in dose dense fashion x4, both given with tifiparnib.    (2) definitive left modified radical mastectomy September of 2010 for a T2 N2 or stage IIIA invasive lobular breast cancer, estrogen and progesterone receptor positive, HER-2 negative,   (3)  post mastectomy radiation completed January of 2011,  (4) briefly on tamoxifen, discontinued it because of uterine lining concerns.  On anastrozole since May 2012 with good tolerance. Held between November and December 2013 due to GI issues, unrelated to cancer or its treatment.  (5)  Other problems include left upper extremity lymphedema, borderline concern regarding genetic family history, history of cervical dysplasia, and history of dense breasts.  PLAN: As far as her breast cancer is concerned, Cyla is doing well. The labs were reviewed in detail and were entirely normal. She says she had a mammogram in [place] in December that was normal. She is tolerating the anastrozole well and will continue this until May 2017 to complete 5 years of antiestrogen therapy.   I am having her sign a release form to obtain records from the PET and CT scans performed in January. I'm inclined to believe that the fact that she hasn't heard anything, means that they were completely normal. To further investigate her neck pain, I am sending her to have a head/neck ultrasound. I was not able to palpate more than one are of concern, but during the entire exam she complained of tenderness.   Nargis will return in 3 months for labs and a follow up visit. She understands and agrees with this plan. She knows the goal of treatment in her case is cure. She has been encouraged to call with any issues that might arise before her next visit here.   Total time in appointment was 25 minutes, with greater than 50% of the time spent face to face with the patient.   Genelle Gather Mardell Cragg    06/23/2014

## 2014-06-23 NOTE — Telephone Encounter (Signed)
per [facility] pt appt-gave pt copy of sch-adv central [facility] will be calling to sch [place]

## 2014-06-23 NOTE — Addendum Note (Signed)
Addended by: Marcelino Duster on: 06/23/2014 05:48 PM   Modules accepted: Orders

## 2014-06-29 ENCOUNTER — Ambulatory Visit (HOSPITAL_COMMUNITY)
Admission: RE | Admit: 2014-06-29 | Discharge: 2014-06-29 | Disposition: A | Discharge status: Home / Self Care | Payer: Medicare Other | Source: Ambulatory Visit | Attending: Nurse Practitioner | Admitting: Nurse Practitioner

## 2014-06-29 DIAGNOSIS — E042 Nontoxic multinodular goiter: Secondary | ICD-10-CM | POA: Diagnosis not present

## 2014-06-29 DIAGNOSIS — E041 Nontoxic single thyroid nodule: Secondary | ICD-10-CM | POA: Insufficient documentation

## 2014-06-29 DIAGNOSIS — R221 Localized swelling, mass and lump, neck: Secondary | ICD-10-CM

## 2014-06-30 ENCOUNTER — Ambulatory Visit (INDEPENDENT_AMBULATORY_CARE_PROVIDER_SITE_OTHER): Payer: Medicare Other | Admitting: Internal Medicine

## 2014-06-30 ENCOUNTER — Encounter: Payer: Self-pay | Admitting: Internal Medicine

## 2014-06-30 VITALS — BP 110/62 | HR 71 | Temp 98.0°F | Resp 20 | Ht 66.0 in | Wt 258.2 lb

## 2014-06-30 DIAGNOSIS — R102 Pelvic and perineal pain: Secondary | ICD-10-CM

## 2014-06-30 DIAGNOSIS — I10 Essential (primary) hypertension: Secondary | ICD-10-CM

## 2014-06-30 DIAGNOSIS — J301 Allergic rhinitis due to pollen: Secondary | ICD-10-CM | POA: Diagnosis not present

## 2014-06-30 DIAGNOSIS — E041 Nontoxic single thyroid nodule: Secondary | ICD-10-CM | POA: Diagnosis not present

## 2014-06-30 DIAGNOSIS — J45909 Unspecified asthma, uncomplicated: Secondary | ICD-10-CM

## 2014-06-30 NOTE — Progress Notes (Signed)
Patient ID: Michaela Little, female   DOB: 1960-05-25, 54 y.o.   MRN: [id number]    Facility  [facility]    Place of Service:   OFFICE   Allergies  Allergen Reactions  . Adhesive [Tape] Itching and Other (See Comments)    REDDNESS  . Calcium Carbonate Antacid Hives    Fruit flavored Tums   . Hctz [Hydrochlorothiazide] Itching  . Latex Itching  . Lisinopril Itching  . Losartan Potassium Other (See Comments)    Makes her feel "bad"    Chief Complaint  Patient presents with  . Medical Management of Chronic Issues    3 month follow-up, Discuss labs (copy printed)    HPI:  54 yo female seen today for f/u. She has pain in her pelvis and has not rescheduled her hysterectomy yet. She has soreness in her neck. Thyroid US revealed inferior 1cm sold nodule that meets criteria for bx. Well circumcised cysts also seen.  Seasonal allergies are controlled with claritin. No asthma exacerbations since her last visit.  BP controlled on amlodipine.  She has no other concerns.  Past Medical History  Diagnosis Date  . Uterine fibroid   . Hyperlipidemia   . Hypertension   . Asthma   . Seasonal allergies   . Anxiety   . Headache(784.0)   . History of breast cancer     2010--  LEFT  s/p  mastectomy (in [place]) AND CHEMORADIATION--  NO RECURRENCE  . GERD (gastroesophageal reflux disease)   . Cervical stenosis (uterine cervix)   . Arthritis     hands, knees, hips  . Positive H. pylori test     08-05-2013  . Pelvic pain in female   . History of cervical dysplasia   . OSA (obstructive sleep apnea) moderate osa per study  09/2010    CPAP  , NOT USING ON REGULAR BASIS  . Refusal of blood transfusions as patient is Jehovah's Witness   . Dyspnea on exertion   . Wears glasses   . History of TB skin testing     AS TEEN--  [place] W/ MEDS   Past Surgical History  Procedure Laterality Date  . Eye surgery  1961    right  . Cervical conization w/bx  2012   in  [place]  . Colonoscopy  2011    normal per  patient - [place]  . Cholecystectomy N/A 05/14/2012    Procedure: LAPAROSCOPIC CHOLECYSTECTOMY;  Surgeon: Ralene Ok, MD;  Location: [facility];  Service: General;  Laterality: N/A;  . Dilation and curettage of uterus  10/15/2011    Procedure: DILATATION AND CURETTAGE;  Surgeon: Melina Schools, MD;  Location: [facility];  Service: Gynecology;  Laterality: N/A;  Conization &  endocervical curettings  . Cardiovascular stress test  10-29-2012    low risk perfusion study/  no significant reversibity/ ef 66%/  normal wall motion  . Transthoracic echocardiogram  10-29-2012    mild lvh/  ef 60-65%/  grade II diastolic dysfunction/  trivial mr  &  tr  . Mastectomy Left 11/2008  in [place]   . Examination under anesthesia N/A 08/20/2013    Procedure: EXAM UNDER ANESTHESIA;  Surgeon: Margarette Asal, MD;  Location: [facility];  Service: Gynecology;  Laterality: N/A;   History   Social History  . Marital Status: Single    Spouse Name: N/A  . Number of Children: 5  . Years of Education: N/A  Occupational History  . Disabled    Social History Main Topics  . Smoking status: Former Smoker -- 0.30 packs/day for 10 years    Types: Cigarettes    Quit date: 03/26/1988  . Smokeless tobacco: Never Used  . Alcohol Use: No  . Drug Use: No  . Sexual Activity: Not Currently   Other Topics Concern  . None   Social History Narrative     Medications: Patient's Medications  New Prescriptions   No medications on file  Previous Medications   ALBUTEROL (VENTOLIN HFA) 108 (90 BASE) MCG/ACT INHALER    Inhale 2 puffs into the lungs every 4 (four) hours as needed for wheezing or shortness of breath.    AMLODIPINE (NORVASC) 10 MG TABLET    Take 1 tablet (10 mg total) by mouth every morning.   ANASTROZOLE (ARIMIDEX) 1 MG TABLET    Take 1 mg by mouth daily.   CALCIUM-VITAMIN D-VITAMIN K (CALCIUM SOFT CHEWS PO)    Take 600 mg by mouth. With vitamin D   LORATADINE (CLARITIN) 10 MG  TABLET    Take one tablet by mouth once daily as needed for allergies   MELATONIN 3 MG TABS    Take by mouth at bedtime.   PREDNISONE 10 MG KIT    12 day dose pack  Modified Medications   No medications on file  Discontinued Medications   No medications on file     Review of Systems  Constitutional: Negative for fever, chills, diaphoresis, activity change, appetite change and fatigue.  HENT: Negative for ear pain and sore throat.   Eyes: Negative for visual disturbance.  Respiratory: Negative for cough, chest tightness and shortness of breath.   Cardiovascular: Negative for chest pain, palpitations and leg swelling.  Gastrointestinal: Negative for nausea, vomiting, abdominal pain, diarrhea, constipation and blood in stool.  Genitourinary: Positive for pelvic pain. Negative for dysuria.  Musculoskeletal: Positive for arthralgias.  Neurological: Positive for headaches. Negative for dizziness, tremors and numbness.  Psychiatric/Behavioral: Negative for sleep disturbance. The patient is not nervous/anxious.     Filed Vitals:   06/30/14 1204  BP: 110/62  Pulse: 71  Temp: 98 F (36.7 C)  TempSrc: Oral  Resp: 20  Height: 5' 6"  (1.676 m)  Weight: 258 lb 3.2 oz (117.119 kg)  SpO2: 97%   Body mass index is 41.69 kg/(m^2).  Physical Exam  Constitutional: She is oriented to person, place, and time. She appears well-developed and well-nourished.  HENT:  Mouth/Throat: Oropharynx is clear and moist. No oropharyngeal exudate.  Eyes: Pupils are equal, round, and reactive to light. No scleral icterus.  Neck: Neck supple. No tracheal deviation present. Thyromegaly present.  Cardiovascular: Normal rate, regular rhythm, normal heart sounds and intact distal pulses.  Exam reveals no gallop and no friction rub.   No murmur heard. No LE edema b/l. no calf TTP. No carotid bruit b/l  Pulmonary/Chest: Effort normal and breath sounds normal. No stridor. No respiratory distress. She has no wheezes.  She has no rales.  Abdominal: Soft. Bowel sounds are normal. She exhibits no distension and no mass. There is tenderness (LLQ). There is no rebound and no guarding.  Musculoskeletal: She exhibits tenderness (left ASIS).  Lymphadenopathy:    She has no cervical adenopathy.  Neurological: She is alert and oriented to person, place, and time. She has normal reflexes.  Skin: Skin is warm and dry. No rash noted.  Psychiatric: She has a normal mood and affect. Her behavior is normal. Judgment  and thought content normal.     Labs reviewed: Office Visit on 06/16/2014  Component Date Value Ref Range Status  . WBC 06/16/2014 8.8  3.4 - 10.8 x10E3/uL Final  . RBC 06/16/2014 5.03  3.77 - 5.28 x10E6/uL Final  . Hemoglobin 06/16/2014 13.8  11.1 - 15.9 g/dL Final  . HCT 06/16/2014 41.4  34.0 - 46.6 % Final  . MCV 06/16/2014 82  79 - 97 fL Final  . MCH 06/16/2014 27.4  26.6 - 33.0 pg Final  . MCHC 06/16/2014 33.3  31.5 - 35.7 g/dL Final  . RDW 06/16/2014 15.3  12.3 - 15.4 % Final  . Platelets 06/16/2014 280  150 - 379 x10E3/uL Final  . Neutrophils Relative % 06/16/2014 65   Final  . Lymphs 06/16/2014 28   Final  . Monocytes 06/16/2014 6   Final  . Eos 06/16/2014 1   Final  . Basos 06/16/2014 0   Final  . Neutrophils Absolute 06/16/2014 5.7  1.4 - 7.0 x10E3/uL Final  . Lymphocytes Absolute 06/16/2014 2.5  0.7 - 3.1 x10E3/uL Final  . Monocytes Absolute 06/16/2014 0.6  0.1 - 0.9 x10E3/uL Final  . Eosinophils Absolute 06/16/2014 0.1  0.0 - 0.4 x10E3/uL Final  . Basophils Absolute 06/16/2014 0.0  0.0 - 0.2 x10E3/uL Final  . Immature Granulocytes 06/16/2014 0   Final  . Immature Grans (Abs) 06/16/2014 0.0  0.0 - 0.1 x10E3/uL Final  . Glucose 06/16/2014 88  65 - 99 mg/dL Final  . BUN 06/16/2014 14  6 - 24 mg/dL Final  . Creatinine, Ser 06/16/2014 0.74  0.57 - 1.00 mg/dL Final  . GFR calc non Af Amer 06/16/2014 91  >59 mL/min/1.73 Final  . GFR calc Af Amer 06/16/2014 105  >59 mL/min/1.73 Final  .  BUN/Creatinine Ratio 06/16/2014 19  9 - 23 Final  . Sodium 06/16/2014 140  134 - 144 mmol/L Final  . Potassium 06/16/2014 4.3  3.5 - 5.2 mmol/L Final  . Chloride 06/16/2014 101  97 - 108 mmol/L Final  . CO2 06/16/2014 23  18 - 29 mmol/L Final  . Calcium 06/16/2014 9.5  8.7 - 10.2 mg/dL Final  . Total Protein 06/16/2014 7.1  6.0 - 8.5 g/dL Final  . Albumin 06/16/2014 4.1  3.5 - 5.5 g/dL Final  . Globulin, Total 06/16/2014 3.0  1.5 - 4.5 g/dL Final  . Albumin/Globulin Ratio 06/16/2014 1.4  1.1 - 2.5 Final  . Bilirubin Total 06/16/2014 0.6  0.0 - 1.2 mg/dL Final  . Alkaline Phosphatase 06/16/2014 112  39 - 117 IU/L Final  . AST 06/16/2014 7  0 - 40 IU/L Final  . ALT 06/16/2014 7  0 - 32 IU/L Final  . TSH 06/16/2014 2.860  0.450 - 4.500 uIU/mL Final     Assessment/Plan    ICD-9-CM ICD-10-CM   1. Pelvic pain in female - unchanged 625.9 R10.2 Ambulatory referral to Gynecology  2. Right thyroid nodule - 1cm solid nodule 241.0 E04.1   3. Essential hypertension - controlled on amlodipine 401.9 I10   4. Asthma, unspecified asthma severity, uncomplicated - stable 032.12 J45.909   5. Allergic rhinitis due to pollen - stable on claritin 477.0 J30.1    --f/u with specialists as scheduled. She will likely need thyroid bx due to hx maligancies  --refer to GYN for second opinion. She prefers someone who feels comfortable performing sx without blood pdts as she is a Jehovah witness  --she plans to travel to [place] for her grandchildren's graduations in  the next several weeks.  --continue current medications as ordered  --RTO in September for routine OV   Caius Silbernagel S. Perlie Gold  [facility] [address] [place], [place] [address] [phone] Office (Wednesdays and Fridays 8 AM - 5 PM) [phone] Cell (Monday-Friday 8 AM - 5 PM)

## 2014-06-30 NOTE — Patient Instructions (Signed)
Follow up with specialists as scheduled  Routine in Sept 2016 for follow up

## 2014-07-01 ENCOUNTER — Other Ambulatory Visit: Payer: Self-pay | Admitting: Nurse Practitioner

## 2014-07-01 ENCOUNTER — Telehealth: Payer: Self-pay | Admitting: Nurse Practitioner

## 2014-07-01 DIAGNOSIS — E041 Nontoxic single thyroid nodule: Secondary | ICD-10-CM

## 2014-07-01 NOTE — Telephone Encounter (Signed)
cld & spoke to pt to adv of lab appt time & date-pt understood

## 2014-07-06 ENCOUNTER — Telehealth: Payer: Self-pay | Admitting: *Deleted

## 2014-07-06 ENCOUNTER — Other Ambulatory Visit (HOSPITAL_BASED_OUTPATIENT_CLINIC_OR_DEPARTMENT_OTHER): Payer: Medicare Other

## 2014-07-06 ENCOUNTER — Other Ambulatory Visit: Payer: Self-pay | Admitting: Nurse Practitioner

## 2014-07-06 DIAGNOSIS — C50912 Malignant neoplasm of unspecified site of left female breast: Secondary | ICD-10-CM

## 2014-07-06 DIAGNOSIS — C773 Secondary and unspecified malignant neoplasm of axilla and upper limb lymph nodes: Secondary | ICD-10-CM | POA: Diagnosis not present

## 2014-07-06 DIAGNOSIS — E041 Nontoxic single thyroid nodule: Secondary | ICD-10-CM | POA: Diagnosis not present

## 2014-07-06 DIAGNOSIS — R221 Localized swelling, mass and lump, neck: Secondary | ICD-10-CM

## 2014-07-06 DIAGNOSIS — G4700 Insomnia, unspecified: Secondary | ICD-10-CM

## 2014-07-06 LAB — TSH CHCC: TSH: 1.68 m[IU]/L (ref 0.308–3.960)

## 2014-07-06 LAB — T4, FREE: Free T4: 1.1 ng/dL (ref 0.80–1.80)

## 2014-07-06 NOTE — Telephone Encounter (Signed)
Returned to call to patient re: her vm about possible lymph node biopsy. Pt. states that she has already heard back from [place], [place] for Dr. Jana Hakim. She has no further questions at this time.

## 2014-08-10 ENCOUNTER — Telehealth: Payer: Self-pay | Admitting: Nurse Practitioner

## 2014-08-10 NOTE — Telephone Encounter (Signed)
Called and spoke with patient and she is aware of her new heather appointment

## 2014-09-16 ENCOUNTER — Encounter (HOSPITAL_COMMUNITY): Payer: Self-pay | Admitting: Emergency Medicine

## 2014-09-16 ENCOUNTER — Emergency Department (INDEPENDENT_AMBULATORY_CARE_PROVIDER_SITE_OTHER)
Admission: EM | Admit: 2014-09-16 | Discharge: 2014-09-16 | Disposition: A | Discharge status: Home / Self Care | Payer: Medicare Other | Source: Home / Self Care | Attending: Family Medicine | Admitting: Family Medicine

## 2014-09-16 DIAGNOSIS — H6092 Unspecified otitis externa, left ear: Secondary | ICD-10-CM | POA: Diagnosis not present

## 2014-09-16 DIAGNOSIS — M542 Cervicalgia: Secondary | ICD-10-CM

## 2014-09-16 MED ORDER — NEOMYCIN-POLYMYXIN-HC 3.5-10000-1 OT SUSP: 4.0000 [drp] | Freq: Three times a day (TID) | OTIC | Status: DC

## 2014-09-16 NOTE — ED Provider Notes (Signed)
Michaela Little is a 54 y.o. female who presents to Urgent Care today for left ear pain. Patient has severe left ear pain radiating to the neck. No fevers chills nausea vomiting or diarrhea. No treatment tried yet. She feels well otherwise.   Past Medical History  Diagnosis Date  . Uterine fibroid   . Hyperlipidemia   . Hypertension   . Asthma   . Seasonal allergies   . Anxiety   . Headache(784.0)   . History of breast cancer     2010--  LEFT  s/p  mastectomy (in [place]) AND CHEMORADIATION--  NO RECURRENCE  . GERD (gastroesophageal reflux disease)   . Cervical stenosis (uterine cervix)   . Arthritis     hands, knees, hips  . Positive H. pylori test     08-05-2013  . Pelvic pain in female   . History of cervical dysplasia   . OSA (obstructive sleep apnea) moderate osa per study  09/2010    CPAP  , NOT USING ON REGULAR BASIS  . Refusal of blood transfusions as patient is Jehovah's Witness   . Dyspnea on exertion   . Wears glasses   . History of TB skin testing     AS TEEN--  [place] W/ MEDS   Past Surgical History  Procedure Laterality Date  . Eye surgery  1961    right  . Cervical conization w/bx  2012   in  [place]  . Colonoscopy  2011    normal per patient - [place]  . Cholecystectomy N/A 05/14/2012    Procedure: LAPAROSCOPIC CHOLECYSTECTOMY;  Surgeon: Ralene Ok, MD;  Location: [facility];  Service: General;  Laterality: N/A;  . Dilation and curettage of uterus  10/15/2011    Procedure: DILATATION AND CURETTAGE;  Surgeon: Melina Schools, MD;  Location: [facility];  Service: Gynecology;  Laterality: N/A;  Conization &  endocervical curettings  . Cardiovascular stress test  10-29-2012    low risk perfusion study/  no significant reversibity/ ef 66%/  normal wall motion  . Transthoracic echocardiogram  10-29-2012    mild lvh/  ef 60-65%/  grade II diastolic dysfunction/  trivial mr  &  tr  . Mastectomy Left 11/2008  in [place]   . Examination under anesthesia N/A 08/20/2013   Procedure: EXAM UNDER ANESTHESIA;  Surgeon: Margarette Asal, MD;  Location: [facility];  Service: Gynecology;  Laterality: N/A;   History  Substance Use Topics  . Smoking status: Former Smoker -- 0.30 packs/day for 10 years    Types: Cigarettes    Quit date: 03/26/1988  . Smokeless tobacco: Never Used  . Alcohol Use: No   ROS as above Medications: No current facility-administered medications for this encounter.   Current Outpatient Prescriptions  Medication Sig Dispense Refill  . amLODipine (NORVASC) 10 MG tablet Take 1 tablet (10 mg total) by mouth every morning. 90 tablet 1  . anastrozole (ARIMIDEX) 1 MG tablet Take 1 mg by mouth daily.    Marland Kitchen loratadine (CLARITIN) 10 MG tablet Take one tablet by mouth once daily as needed for allergies 90 tablet 1  . albuterol (VENTOLIN HFA) 108 (90 BASE) MCG/ACT inhaler Inhale 2 puffs into the lungs every 4 (four) hours as needed for wheezing or shortness of breath.     . Calcium-Vitamin D-Vitamin K (CALCIUM SOFT CHEWS PO) Take 600 mg by mouth. With vitamin D    . Melatonin 3 MG TABS Take by  mouth at bedtime.    Marland Kitchen neomycin-polymyxin-hydrocortisone (CORTISPORIN) 3.5-10000-1 otic suspension Place 4 drops into the left ear 3 (three) times daily. 10 mL 1  . PredniSONE 10 MG KIT 12 day dose pack 1 kit 0   Allergies  Allergen Reactions  . Adhesive [Tape] Itching and Other (See Comments)    REDDNESS  . Calcium Carbonate Antacid Hives    Fruit flavored Tums   . Hctz [Hydrochlorothiazide] Itching  . Latex Itching  . Lisinopril Itching  . Losartan Potassium Other (See Comments)    Makes her feel "bad"     Exam:  BP 138/64 mmHg  Pulse 81  Temp(Src) 98.8 F (37.1 C) (Oral)  Resp 16  SpO2 99%  LMP 02/25/2009 Gen: Well NAD HEENT: EOMI,  MMM left ear is tender to motion. Ear canal swollen and erythematous. Tympanic membrane is barely visible but not erythematous. Right ear canal is normal and nontender. Cervical lymphadenopathy is  present bilaterally worse in the left and mild tender in the left. Lungs: Normal work of breathing. CTABL Heart: RRR no MRG Abd: NABS, Soft. Nondistended, Nontender Exts: Brisk capillary refill, warm and well perfused.   No results found for this or any previous visit (from the past 24 hour(s)). No results found.  Assessment and Plan: 54 y.o. female with otitis externa. Neck pain is likely radiating pain. Treat with Cortisporin drops.  Discussed warning signs or symptoms. Please see discharge instructions. Patient expresses understanding.     Gregor Hams, MD 09/16/14 [id number]

## 2014-09-16 NOTE — Discharge Instructions (Signed)
Thank you for coming in today. ° °Otitis Externa °Otitis externa is a bacterial or fungal infection of the outer ear canal. This is the area from the eardrum to the outside of the ear. Otitis externa is sometimes called "swimmer's ear." °CAUSES  °Possible causes of infection include: °· Swimming in dirty water. °· Moisture remaining in the ear after swimming or bathing. °· Mild injury (trauma) to the ear. °· Objects stuck in the ear (foreign body). °· Cuts or scrapes (abrasions) on the outside of the ear. °SIGNS AND SYMPTOMS  °The first symptom of infection is often itching in the ear canal. Later signs and symptoms may include swelling and redness of the ear canal, ear pain, and yellowish-white fluid (pus) coming from the ear. The ear pain may be worse when pulling on the earlobe. °DIAGNOSIS  °Your health care provider will perform a physical exam. A sample of fluid may be taken from the ear and examined for bacteria or fungi. °TREATMENT  °Antibiotic ear drops are often given for 10 to 14 days. Treatment may also include pain medicine or corticosteroids to reduce itching and swelling. °HOME CARE INSTRUCTIONS  °· Apply antibiotic ear drops to the ear canal as prescribed by your health care provider. °· Take medicines only as directed by your health care provider. °· If you have diabetes, follow any additional treatment instructions from your health care provider. °· Keep all follow-up visits as directed by your health care provider. °PREVENTION  °· Keep your ear dry. Use the corner of a towel to absorb water out of the ear canal after swimming or bathing. °· Avoid scratching or putting objects inside your ear. This can damage the ear canal or remove the protective wax that lines the canal. This makes it easier for bacteria and fungi to grow. °· Avoid swimming in lakes, polluted water, or poorly chlorinated pools. °· You may use ear drops made of rubbing alcohol and vinegar after swimming. Combine equal parts of  white vinegar and alcohol in a bottle. Put 3 or 4 drops into each ear after swimming. °SEEK MEDICAL CARE IF:  °· You have a fever. °· Your ear is still red, swollen, painful, or draining pus after 3 days. °· Your redness, swelling, or pain gets worse. °· You have a severe headache. °· You have redness, swelling, pain, or tenderness in the area behind your ear. °MAKE SURE YOU:  °· Understand these instructions. °· Will watch your condition. °· Will get help right away if you are not doing well or get worse. °Document Released: 03/12/2005 Document Revised: 07/27/2013 Document Reviewed: 03/29/2011 °ExitCare® Patient Information ©2015 [employer]. This information is not intended to replace advice given to you by your health care provider. Make sure you discuss any questions you have with your health care provider. ° °

## 2014-09-16 NOTE — ED Notes (Addendum)
Pt reports multiple cyst around neck area.... Hx of cancer Painful and increases w/activity... Also reports odynophagia Alert, no signs of acute distress.

## 2014-09-23 DIAGNOSIS — R102 Pelvic and perineal pain: Secondary | ICD-10-CM | POA: Diagnosis not present

## 2014-09-24 ENCOUNTER — Ambulatory Visit: Payer: Medicare Other | Admitting: Nurse Practitioner

## 2014-09-24 ENCOUNTER — Other Ambulatory Visit: Payer: Medicare Other

## 2014-10-01 ENCOUNTER — Encounter: Payer: Self-pay | Admitting: Internal Medicine

## 2014-10-01 ENCOUNTER — Other Ambulatory Visit: Payer: Self-pay | Admitting: *Deleted

## 2014-10-01 ENCOUNTER — Telehealth: Payer: Self-pay | Admitting: Oncology

## 2014-10-01 ENCOUNTER — Ambulatory Visit (INDEPENDENT_AMBULATORY_CARE_PROVIDER_SITE_OTHER): Payer: Medicare Other | Admitting: Internal Medicine

## 2014-10-01 ENCOUNTER — Ambulatory Visit (HOSPITAL_BASED_OUTPATIENT_CLINIC_OR_DEPARTMENT_OTHER): Payer: Medicare Other | Admitting: Nurse Practitioner

## 2014-10-01 ENCOUNTER — Other Ambulatory Visit (HOSPITAL_BASED_OUTPATIENT_CLINIC_OR_DEPARTMENT_OTHER): Payer: Medicare Other

## 2014-10-01 ENCOUNTER — Encounter: Payer: Self-pay | Admitting: Nurse Practitioner

## 2014-10-01 VITALS — BP 110/70 | HR 71 | Temp 98.1°F | Wt 261.0 lb

## 2014-10-01 VITALS — BP 152/69 | HR 72 | Temp 98.6°F | Resp 18 | Ht 66.0 in | Wt 259.1 lb

## 2014-10-01 DIAGNOSIS — N898 Other specified noninflammatory disorders of vagina: Secondary | ICD-10-CM

## 2014-10-01 DIAGNOSIS — R221 Localized swelling, mass and lump, neck: Secondary | ICD-10-CM

## 2014-10-01 DIAGNOSIS — C773 Secondary and unspecified malignant neoplasm of axilla and upper limb lymph nodes: Secondary | ICD-10-CM

## 2014-10-01 DIAGNOSIS — C50912 Malignant neoplasm of unspecified site of left female breast: Secondary | ICD-10-CM | POA: Diagnosis not present

## 2014-10-01 DIAGNOSIS — I890 Lymphedema, not elsewhere classified: Secondary | ICD-10-CM

## 2014-10-01 DIAGNOSIS — Z853 Personal history of malignant neoplasm of breast: Secondary | ICD-10-CM | POA: Diagnosis not present

## 2014-10-01 DIAGNOSIS — R300 Dysuria: Secondary | ICD-10-CM | POA: Diagnosis not present

## 2014-10-01 DIAGNOSIS — L723 Sebaceous cyst: Secondary | ICD-10-CM | POA: Diagnosis not present

## 2014-10-01 LAB — COMPREHENSIVE METABOLIC PANEL (CC13)
ALT: 9 U/L (ref 0–55)
AST: 15 U/L (ref 5–34)
Albumin: 3.5 g/dL (ref 3.5–5.0)
Alkaline Phosphatase: 117 U/L (ref 40–150)
Anion Gap: 7 mEq/L (ref 3–11)
BUN: 17.2 mg/dL (ref 7.0–26.0)
CHLORIDE: 109 meq/L (ref 98–109)
CO2: 26 meq/L (ref 22–29)
CREATININE: 0.8 mg/dL (ref 0.6–1.1)
Calcium: 9.4 mg/dL (ref 8.4–10.4)
EGFR: >90 mL/min/{1.73_m2} (ref >90)
Glucose: 94 mg/dl (ref 70–140)
Potassium: 4.2 mEq/L (ref 3.5–5.1)
Sodium: 141 mEq/L (ref 136–145)
Total Bilirubin: 0.76 mg/dL (ref 0.20–1.20)
Total Protein: 7 g/dL (ref 6.4–8.3)

## 2014-10-01 LAB — CBC WITH DIFFERENTIAL/PLATELET
BASO%: 0.4 % (ref 0.0–2.0)
Basophils Absolute: 0 10*3/uL (ref 0.0–0.1)
EOS%: 4.2 % (ref 0.0–7.0)
Eosinophils Absolute: 0.2 10*3/uL (ref 0.0–0.5)
HEMATOCRIT: 39.5 % (ref 34.8–46.6)
HGB: 13 g/dL (ref 11.6–15.9)
LYMPH#: 1.5 10*3/uL (ref 0.9–3.3)
LYMPH%: 33 % (ref 14.0–49.7)
MCH: 27.9 pg (ref 25.1–34.0)
MCHC: 32.9 g/dL (ref 31.5–36.0)
MCV: 84.8 fL (ref 79.5–101.0)
MONO#: 0.4 10*3/uL (ref 0.1–0.9)
MONO%: 7.8 % (ref 0.0–14.0)
NEUT#: 2.5 10*3/uL (ref 1.5–6.5)
NEUT%: 54.6 % (ref 38.4–76.8)
Platelets: 244 10*3/uL (ref 145–400)
RBC: 4.66 10*6/uL (ref 3.70–5.45)
RDW: 13.9 % (ref 11.2–14.5)
WBC: 4.5 10*3/uL (ref 3.9–10.3)

## 2014-10-01 LAB — POCT URINALYSIS DIPSTICK
Bilirubin, UA: NEGATIVE
Blood, UA: NEGATIVE
Glucose, UA: NEGATIVE
KETONES UA: NEGATIVE
LEUKOCYTES UA: NEGATIVE
NITRITE UA: NEGATIVE
Spec Grav, UA: >=1.03
Urobilinogen, UA: 0.2
pH, UA: 5

## 2014-10-01 MED ORDER — METRONIDAZOLE 500 MG PO TABS: 500.0000 mg | ORAL_TABLET | Freq: Two times a day (BID) | ORAL | Status: DC

## 2014-10-01 NOTE — Telephone Encounter (Signed)
Gave and printed appt sched and avs for pt for July and Jan 2017...the patient sched for 7.20 @ 10:45am at [facility]

## 2014-10-01 NOTE — Patient Instructions (Signed)
Increase water intake due to concentrated urine  Continue current medications as ordered  Follow up with specialists as scheduled  Follow up as scheduled. Cancel August appt

## 2014-10-01 NOTE — Progress Notes (Signed)
ID: Michaela Little   DOB: 05-Jun-1960  MR#: [id number]  OQH#:[id number]  PCP: Michaela Cranker, DO SU:  Michaela Little: Harrell Gave, MD OTHER MD: Crissie Reese, Silvano Rusk, Christinia Gully  CHIEF COMPLAINT: breast cancer, neck pain CURRENT TREATMENT: anastrozole   BREAST CANCER HISTORY:  Michaela Little is a [place] woman who moved to [place] in 2013 with a history of breast cancer, and establishied herself with our practice.  The patient had left breast and left axillary lymph node biopsy performed March of 2010, both positive (I do not have the pathology report at this point). She was treated neo-adjuvantly at [facility] with (a) paclitaxel weekly x12 and (b) doxorubicin/cyclophosphamide in dose dense fashion x4, both given with tifiparnib.   She then proceeded to left modified radical mastectomy 12/23/2008, the final pathology showing a 4 cm residual invasive lobular carcinoma, involving 5/22 lymph nodes sampled. The tumor was estrogen and progesterone receptor positive, HER-2 negative.   Postoperatively she received 50 gray of radiation completed January of 2011, including the left supraclavicular lymph node basin. She was then started on tamoxifen, but developed some uterine lining thickening and was switched to anastrozole in May 2012. Her subsequent history is as detailed below  INTERVAL HISTORY: Michaela Little returns today for follow up of her breast cancer and recurrent neck pain. Her last ultrasound of the neck showed cysts and nodules, but none were big enough to warrant biopsy. Her neck is still tender and bothers her. She also feels like she palpated a mass in her right axilla, and it is very tender today. Her last mammogram was in [place] in December.   She continues on anastrozole and tolerates this well. She has some hot flashes, but denies vaginal changes. Her bilateral knee pain is chronic and began before this drug.  REVIEW OF SYSTEMS: Michaela Little denies fevers or chills. She  her occasional nausea and even light vomiting continues, this is a chronic finding for her. She is constipated. She has pelvic discomfort and is scheduled for a hysterectomy next month with Dr. Matthew Saras. Her appetite is down. She is chronically short of breath, but does not use her inhaler as directed. A detailed review of systems is otherwise stable.   PAST MEDICAL HISTORY: Past Medical History  Diagnosis Date  . Uterine fibroid   . Hyperlipidemia   . Hypertension   . Asthma   . Seasonal allergies   . Anxiety   . Headache(784.0)   . History of breast cancer     2010--  LEFT  s/p  mastectomy (in [place]) AND CHEMORADIATION--  NO RECURRENCE  . GERD (gastroesophageal reflux disease)   . Cervical stenosis (uterine cervix)   . Arthritis     hands, knees, hips  . Positive H. pylori test     08-05-2013  . Pelvic pain in female   . History of cervical dysplasia   . OSA (obstructive sleep apnea) moderate osa per study  09/2010    CPAP  , NOT USING ON REGULAR BASIS  . Refusal of blood transfusions as patient is Jehovah's Witness   . Dyspnea on exertion   . Wears glasses   . History of TB skin testing     AS TEEN--  [place] W/ MEDS    PAST SURGICAL HISTORY: Past Surgical History  Procedure Laterality Date  . Eye surgery  1961    right  . Cervical conization w/bx  2012   in  [place]  . Colonoscopy  2011    normal  per patient - [place]  . Cholecystectomy N/A 05/14/2012    Procedure: LAPAROSCOPIC CHOLECYSTECTOMY;  Surgeon: Michaela Ok, MD;  Location: [facility];  Service: General;  Laterality: N/A;  . Dilation and curettage of uterus  10/15/2011    Procedure: DILATATION AND CURETTAGE;  Surgeon: Melina Schools, MD;  Location: [facility];  Service: Gynecology;  Laterality: N/A;  Conization &  endocervical curettings  . Cardiovascular stress test  10-29-2012    low risk perfusion study/  no significant reversibity/ ef 66%/  normal wall motion  . Transthoracic echocardiogram  10-29-2012    mild lvh/  ef 60-65%/   grade II diastolic dysfunction/  trivial mr  &  tr  . Mastectomy Left 11/2008  in [place]   . Examination under anesthesia N/A 08/20/2013    Procedure: EXAM UNDER ANESTHESIA;  Surgeon: Margarette Asal, MD;  Location: [facility];  Service: Gynecology;  Laterality: N/A;    FAMILY HISTORY Family History  Problem Relation Age of Onset  . Breast cancer Mother   . Colon cancer Mother   . Hypotension Mother   . Asthma Mother   . Diabetes type II Mother   . Arthritis Mother   . Clotting disorder Mother   . Cancer Mother     breast/colon  . Mental illness Brother   . Heart disease Brother   . Cerebral palsy Daughter   . Emphysema Brother     never smoker  . Colon cancer Maternal Aunt   . Cancer Maternal Aunt     colon  . Colon cancer Maternal Uncle   . Cancer Maternal Uncle     colon  . Esophageal cancer Neg Hx   . Rectal cancer Neg Hx   . Stomach cancer Neg Hx   . Thyroid disease Neg Hx    Gynecologic history: She is GX P5. At first pregnancy to term age 79. Menarche age 5. Last menstrual period when she had her chemotherapy.   Social History: She is disabled secondary to arthritis. She moved from [place] to [place] for "peace of mind". Of her 5 children only one daughter is living in [place], [place]. One daughter in [place] is incarcerated and another one is "in a lot of stress". The patient lives alone. She mostly reads and does computer games on her phone during the day. She has 20 grandchildren.    ADVANCED DIRECTIVES: She does not have a healthcare power of attorney.    HEALTH MAINTENANCE: History  Substance Use Topics  . Smoking status: Former Smoker -- 0.30 packs/day for 10 years    Types: Cigarettes    Quit date: 03/26/1988  . Smokeless tobacco: Never Used  . Alcohol Use: No     Colonoscopy:  PAP:  Bone density:  Lipid panel:  Allergies  Allergen Reactions  . Adhesive [Tape] Itching and Other (See Comments)     REDDNESS  . Calcium Carbonate Antacid Hives    Fruit flavored Tums   . Hctz [Hydrochlorothiazide] Itching  . Latex Itching  . Lisinopril Itching  . Losartan Potassium Other (See Comments)    Makes her feel "bad"    Current Outpatient Prescriptions  Medication Sig Dispense Refill  . amLODipine (NORVASC) 10 MG tablet Take 1 tablet (10 mg total) by mouth every morning. 90 tablet 1  . anastrozole (ARIMIDEX) 1 MG tablet Take 1 mg by mouth daily.    . Calcium-Vitamin D-Vitamin K (CALCIUM SOFT CHEWS  PO) Take 600 mg by mouth. With vitamin D    . loratadine (CLARITIN) 10 MG tablet Take one tablet by mouth once daily as needed for allergies 90 tablet 1  . albuterol (VENTOLIN HFA) 108 (90 BASE) MCG/ACT inhaler Inhale 2 puffs into the lungs every 4 (four) hours as needed for wheezing or shortness of breath.      No current facility-administered medications for this visit.    OBJECTIVE: Middle-aged African American woman   Filed Vitals:   10/01/14 1102  BP: 152/69  Pulse: 72  Temp: 98.6 F (37 C)  Resp: 18     Body mass index is 41.84 kg/(m^2).    ECOG FS: 1 Filed Weights   10/01/14 1102  Weight: 259 lb 1.6 oz (117.527 kg)   Skin: warm, dry  HEENT: sclerae anicteric, conjunctivae pink, oropharynx clear. No thrush or mucositis. Tenderness to bilateral cervical chain. Left posterior cervical fullness. Lymph Nodes: No cervical or supraclavicular lymphadenopathy  Lungs: clear to auscultation bilaterally, no rales, wheezes, or rhonci  Heart: regular rate and rhythm  Abdomen: round, soft, non tender, positive bowel sounds, tenderness to upper right quadrant with deep palpation Musculoskeletal: No focal spinal tenderness, no peripheral edema  Neuro: non focal, well oriented, positive affect  Breasts: left breast status post mastectomy and expander placement. Skin taut over expander. No evidence of recurrent disease. Left axilla benign, right breast unremarkable. Right axillary tenderness.  Unable to palpate well defined mass.   LAB RESULTS: Lab Results  Component Value Date   WBC 4.5 10/01/2014   NEUTROABS 2.5 10/01/2014   HGB 13.0 10/01/2014   HCT 39.5 10/01/2014   MCV 84.8 10/01/2014   PLT 244 10/01/2014      Chemistry      Component Value Date/Time   NA 141 10/01/2014 1049   NA 140 06/16/2014 0915   NA 141 11/19/2013 1324   K 4.2 10/01/2014 1049   K 4.3 06/16/2014 0915   CL 101 06/16/2014 0915   CL 105 07/07/2012 1327   CO2 26 10/01/2014 1049   CO2 23 06/16/2014 0915   BUN 17.2 10/01/2014 1049   BUN 14 06/16/2014 0915   BUN 10 11/19/2013 1324   CREATININE 0.8 10/01/2014 1049   CREATININE 0.74 06/16/2014 0915      Component Value Date/Time   CALCIUM 9.4 10/01/2014 1049   CALCIUM 9.5 06/16/2014 0915   ALKPHOS 117 10/01/2014 1049   ALKPHOS 112 06/16/2014 0915   AST 15 10/01/2014 1049   AST 7 06/16/2014 0915   ALT 9 10/01/2014 1049   ALT 7 06/16/2014 0915   BILITOT 0.76 10/01/2014 1049   BILITOT 0.6 06/16/2014 0915   BILITOT 1.4* 11/19/2013 1324       Lab Results  Component Value Date   LABCA2 19 01/17/2012     STUDIES: Right mammography at [facility] 07/10/2012 was unremarkable. Bone density scan at [facility] 08/06/2012 was normal, with a T score of 0.6   54 y.o.  [place] woman relocated here from [place],    (1)  left-sided breast cancer diagnosed March of 2010 and treated neo-adjuvantly at [facility] with (a) paclitaxel weekly x12 and (b) doxorubicin/ cyclophosphamide in dose dense fashion x4, both given with tifiparnib.   (2) definitive left modified radical mastectomy September of 2010 for a T2 N2 or stage IIIA invasive lobular breast cancer, estrogen and progesterone receptor positive, HER-2 negative,   (3)  post mastectomy radiation completed January of 2011,  (4) briefly on tamoxifen, discontinued it  because of uterine lining concerns.  On anastrozole since May 2012 with good tolerance. Held between November and December 2013 due to GI  issues, unrelated to cancer or its treatment.  (5)  Other problems include left upper extremity lymphedema, borderline concern regarding genetic family history, history of cervical dysplasia, and history of dense breasts.  PLAN: Michaela Little is doing well as far as her breast cancer is concerned. I was not able to palpate an abnormality in her right axilla, so she will need an ultrasound to follow up. Because it has been greater than 6 months since her last mammogram, this will have to be ordered first.   Her neck exam has not changed in my opinion since it was last evaluated 3 months ago, but the patient requested a follow up ultrasound to be sure, and I am willing to order such.   She is tolerating the anastrozole well and will continue this for 5 years of antiestrogen therapy. The rest of her generalized complaints will be addressed by her PCP this afternoon. She will return for follow up with [place] in 6 months. She understand and agrees with this plan. She knows the goal of treatment in her case is cure. She has been encouraged to call with any issues that might arise before her next visit here.  Total time spent in appointment was 25 minutes with greater than 50% of the time spent face to face with patient.  Genelle Gather Boelter    10/01/2014

## 2014-10-01 NOTE — Progress Notes (Signed)
Patient ID: Michaela Little, female   DOB: Dec 03, 1960, 54 y.o.   MRN: [id number]    Location:    [facility]   Place of Service:   OFFICE  Chief Complaint  Patient presents with  . Acute Visit    cyst on neck causing pain; dysuria    HPI:  54 yo female seen today for painful right neck cyst. She saw oncologist this AM. Soft tissue neck US ordered. Neck exam had not changed in 3 mos per oncology.  She c/o 3 day hx dysuria and >1 week hx urinary frequency and urgency. (+) sharp radiating pain in abdomen. (+)N/V.  No new back pain. No f/c. She has hot flashes and night sweats due to anastrazole tx for breast CA. She notes a brownish vaginal d/c x several days. She is not sexually active.  She has knee pain also.  She is scheduled for complete hysterectomy on August 2nd due to uterine fibroids  Past Medical History  Diagnosis Date  . Uterine fibroid   . Hyperlipidemia   . Hypertension   . Asthma   . Seasonal allergies   . Anxiety   . Headache(784.0)   . History of breast cancer     2010--  LEFT  s/p  mastectomy (in [place]) AND CHEMORADIATION--  NO RECURRENCE  . GERD (gastroesophageal reflux disease)   . Cervical stenosis (uterine cervix)   . Arthritis     hands, knees, hips  . Positive H. pylori test     08-05-2013  . Pelvic pain in female   . History of cervical dysplasia   . OSA (obstructive sleep apnea) moderate osa per study  09/2010    CPAP  , NOT USING ON REGULAR BASIS  . Refusal of blood transfusions as patient is Jehovah's Witness   . Dyspnea on exertion   . Wears glasses   . History of TB skin testing     AS TEEN--  [place] W/ MEDS    Past Surgical History  Procedure Laterality Date  . Eye surgery  1961    right  . Cervical conization w/bx  2012   in  [place]  . Colonoscopy  2011    normal per patient - [place]  . Cholecystectomy N/A 05/14/2012    Procedure: LAPAROSCOPIC CHOLECYSTECTOMY;  Surgeon: Ralene Ok, MD;  Location: [facility];  Service: General;  Laterality: N/A;  .  Dilation and curettage of uterus  10/15/2011    Procedure: DILATATION AND CURETTAGE;  Surgeon: Melina Schools, MD;  Location: [facility];  Service: Gynecology;  Laterality: N/A;  Conization &  endocervical curettings  . Cardiovascular stress test  10-29-2012    low risk perfusion study/  no significant reversibity/ ef 66%/  normal wall motion  . Transthoracic echocardiogram  10-29-2012    mild lvh/  ef 60-65%/  grade II diastolic dysfunction/  trivial mr  &  tr  . Mastectomy Left 11/2008  in [place]   . Examination under anesthesia N/A 08/20/2013    Procedure: EXAM UNDER ANESTHESIA;  Surgeon: Margarette Asal, MD;  Location: [facility];  Service: Gynecology;  Laterality: N/A;    Patient Care Team: Gildardo Cranker, DO as PCP - General (Internal Medicine) Blanchie Serve, MD as Referring Physician (Internal Medicine) Chauncey Cruel, MD as Consulting Physician (Oncology)  History   Social History  . Marital Status: Single    Spouse Name: N/A  . Number of Children: 5  .  Years of Education: N/A   Occupational History  . Disabled    Social History Main Topics  . Smoking status: Former Smoker -- 0.30 packs/day for 10 years    Types: Cigarettes    Quit date: 03/26/1988  . Smokeless tobacco: Never Used  . Alcohol Use: No  . Drug Use: No  . Sexual Activity: Not Currently   Other Topics Concern  . Not on file   Social History Narrative     reports that she quit smoking about 26 years ago. Her smoking use included Cigarettes. She has a 3 pack-year smoking history. She has never used smokeless tobacco. She reports that she does not drink alcohol or use illicit drugs.  Allergies  Allergen Reactions  . Adhesive [Tape] Itching and Other (See Comments)    REDDNESS  . Calcium Carbonate Antacid Hives    Fruit flavored Tums   . Hctz [Hydrochlorothiazide] Itching  . Latex Itching  . Lisinopril Itching  . Losartan Potassium Other (See Comments)    Makes  her feel "bad"    Medications: Patient's Medications  New Prescriptions   METRONIDAZOLE (FLAGYL) 500 MG TABLET    Take 1 tablet (500 mg total) by mouth 2 (two) times daily.  Previous Medications   ALBUTEROL (VENTOLIN HFA) 108 (90 BASE) MCG/ACT INHALER    Inhale 2 puffs into the lungs every 4 (four) hours as needed for wheezing or shortness of breath.    AMLODIPINE (NORVASC) 10 MG TABLET    Take 1 tablet (10 mg total) by mouth every morning.   ANASTROZOLE (ARIMIDEX) 1 MG TABLET    Take 1 mg by mouth daily.   CALCIUM-VITAMIN D-VITAMIN K (CALCIUM SOFT CHEWS PO)    Take 600 mg by mouth. With vitamin D   LORATADINE (CLARITIN) 10 MG TABLET    Take one tablet by mouth once daily as needed for allergies  Modified Medications   No medications on file  Discontinued Medications   No medications on file    Review of Systems  Constitutional: Positive for activity change. Negative for fever, chills, diaphoresis, appetite change and fatigue.  HENT: Negative for ear pain and sore throat.   Eyes: Negative for visual disturbance.  Respiratory: Negative for cough, chest tightness and shortness of breath.   Cardiovascular: Negative for chest pain, palpitations and leg swelling.  Gastrointestinal: Positive for nausea, vomiting and abdominal pain. Negative for diarrhea, constipation and blood in stool.  Genitourinary: Positive for dysuria, urgency, frequency and vaginal discharge.  Musculoskeletal: Positive for back pain, arthralgias and neck pain.  Skin:       Painful neck cyst  Neurological: Negative for dizziness, tremors, numbness and headaches.  Psychiatric/Behavioral: Negative for sleep disturbance. The patient is not nervous/anxious.     Filed Vitals:   10/01/14 1615  BP: 110/70  Pulse: 71  Temp: 98.1 F (36.7 C)  Weight: 261 lb (118.389 kg)  SpO2: 97%   Body mass index is 42.15 kg/(m^2).  Physical Exam  Constitutional: She is oriented to person, place, and time. She appears  well-developed and well-nourished. No distress.  HENT:  Mouth/Throat: Oropharynx is clear and moist. No oropharyngeal exudate.  Eyes: Pupils are equal, round, and reactive to light. No scleral icterus.  Neck: Trachea normal. Neck supple. No JVD present. Spinous process tenderness and muscular tenderness present. Carotid bruit is not present. Decreased range of motion present. No edema present. No thyromegaly present.    Cardiovascular: Normal rate, regular rhythm, normal heart sounds and intact distal pulses.  Exam reveals no gallop and no friction rub.   No murmur heard. +1 pitting LE edema b/l. No calf TTP  Pulmonary/Chest: Effort normal and breath sounds normal. No respiratory distress. She has no wheezes. She has no rales.  Abdominal: Soft. Bowel sounds are normal. She exhibits no abdominal bruit, no ascites and no mass. There is no hepatomegaly. There is tenderness (at suprapubic area. No CVAT). There is no rebound and no guarding.  obese  Neurological: She is alert and oriented to person, place, and time.  Skin: Skin is warm and dry. No rash noted.  Psychiatric: She has a normal mood and affect. Her speech is normal and behavior is normal. Thought content normal. Her mood appears not anxious. She does not exhibit a depressed mood.     Labs reviewed: Office Visit on 10/01/2014  Component Date Value Ref Range Status  . Color, UA 10/01/2014 yellow   Final  . Clarity, UA 10/01/2014 clear   Final  . Glucose, UA 10/01/2014 negative   Final  . Bilirubin, UA 10/01/2014 negative   Final  . Ketones, UA 10/01/2014 negative   Final  . Spec Grav, UA 10/01/2014 >=1.030   Final  . Blood, UA 10/01/2014 negative   Final  . pH, UA 10/01/2014 5.0   Final  . Protein, UA 10/01/2014 trace   Final  . Urobilinogen, UA 10/01/2014 0.2   Final  . Nitrite, UA 10/01/2014 negative   Final  . Leukocytes, UA 10/01/2014 Negative  Negative Final  . Urine Culture, Routine 10/01/2014 Final report   Final  .  Result 1 10/01/2014 Comment   Final   Comment: Mixed urogenital flora Less than 10,000 colonies/mL   Appointment on 10/01/2014  Component Date Value Ref Range Status  . WBC 10/01/2014 4.5  3.9 - 10.3 10e3/uL Final  . NEUT# 10/01/2014 2.5  1.5 - 6.5 10e3/uL Final  . HGB 10/01/2014 13.0  11.6 - 15.9 g/dL Final  . HCT 10/01/2014 39.5  34.8 - 46.6 % Final  . Platelets 10/01/2014 244  145 - 400 10e3/uL Final  . MCV 10/01/2014 84.8  79.5 - 101.0 fL Final  . MCH 10/01/2014 27.9  25.1 - 34.0 pg Final  . MCHC 10/01/2014 32.9  31.5 - 36.0 g/dL Final  . RBC 10/01/2014 4.66  3.70 - 5.45 10e6/uL Final  . RDW 10/01/2014 13.9  11.2 - 14.5 % Final  . lymph# 10/01/2014 1.5  0.9 - 3.3 10e3/uL Final  . MONO# 10/01/2014 0.4  0.1 - 0.9 10e3/uL Final  . Eosinophils Absolute 10/01/2014 0.2  0.0 - 0.5 10e3/uL Final  . Basophils Absolute 10/01/2014 0.0  0.0 - 0.1 10e3/uL Final  . NEUT% 10/01/2014 54.6  38.4 - 76.8 % Final  . LYMPH% 10/01/2014 33.0  14.0 - 49.7 % Final  . MONO% 10/01/2014 7.8  0.0 - 14.0 % Final  . EOS% 10/01/2014 4.2  0.0 - 7.0 % Final  . BASO% 10/01/2014 0.4  0.0 - 2.0 % Final  . Sodium 10/01/2014 141  136 - 145 mEq/L Final  . Potassium 10/01/2014 4.2  3.5 - 5.1 mEq/L Final  . Chloride 10/01/2014 109  98 - 109 mEq/L Final  . CO2 10/01/2014 26  22 - 29 mEq/L Final  . Glucose 10/01/2014 94  70 - 140 mg/dl Final  . BUN 10/01/2014 17.2  7.0 - 26.0 mg/dL Final  . Creatinine 10/01/2014 0.8  0.6 - 1.1 mg/dL Final  . Total Bilirubin 10/01/2014 0.76  0.20 - 1.20 mg/dL  Final  . Alkaline Phosphatase 10/01/2014 117  40 - 150 U/L Final  . AST 10/01/2014 15  5 - 34 U/L Final  . ALT 10/01/2014 9  0 - 55 U/L Final  . Total Protein 10/01/2014 7.0  6.4 - 8.3 g/dL Final  . Albumin 10/01/2014 3.5  3.5 - 5.0 g/dL Final  . Calcium 10/01/2014 9.4  8.4 - 10.4 mg/dL Final  . Anion Gap 10/01/2014 7  3 - 11 mEq/L Final  . EGFR 10/01/2014 >90  >90 ml/min/1.73 m2 Final   eGFR is calculated using the CKD-EPI  Creatinine Equation (2009)  Appointment on 07/06/2014  Component Date Value Ref Range Status  . TSH 07/06/2014 1.680  0.308 - 3.960 m(IU)/L Final  . Free T4 07/06/2014 1.10  0.80 - 1.80 ng/dL Final    No results found.   Assessment/Plan   ICD-9-CM ICD-10-CM   1. Vaginal discharge - probable bacterial vaginosis  623.5 N89.8 metroNIDAZOLE (FLAGYL) 500 MG tablet  2. Dysuria - probably related to #1 788.1 R30.0 POC Urinalysis Dipstick     Urinalysis     Culture, Urine     Culture, Urine     Culture, Urine     Culture, Urine     Culture, Urine  3. Lump in neck - painful; etiology unknown; f/u for [place] as scheduled 784.2 R22.1   4. History of breast cancer - currently on arimidex V10.3 Z85.3    --Increase water intake due to concentrated urine  --will tx vaginal d/c empirically due to hx. If cx (+) will tx UTI accordingly  --Continue current medications as ordered  --Follow up with specialists as scheduled  --Follow up as scheduled. Cancel August appt as she is having a TAH at the beginning of August  Samik Balkcom S. Perlie Gold  [facility] [address] [place], [place] [address] [phone] Cell (Monday-Friday 8 AM - 5 PM) [phone] After 5 PM and follow prompts

## 2014-10-02 LAB — URINE CULTURE

## 2014-10-03 ENCOUNTER — Encounter: Payer: Self-pay | Admitting: Internal Medicine

## 2014-10-08 ENCOUNTER — Ambulatory Visit (HOSPITAL_COMMUNITY)
Admission: RE | Admit: 2014-10-08 | Discharge: 2014-10-08 | Disposition: A | Discharge status: Home / Self Care | Payer: Medicare Other | Source: Ambulatory Visit | Attending: Nurse Practitioner | Admitting: Nurse Practitioner

## 2014-10-08 DIAGNOSIS — E042 Nontoxic multinodular goiter: Secondary | ICD-10-CM | POA: Insufficient documentation

## 2014-10-08 DIAGNOSIS — R221 Localized swelling, mass and lump, neck: Secondary | ICD-10-CM | POA: Diagnosis present

## 2014-10-11 ENCOUNTER — Other Ambulatory Visit: Payer: Self-pay | Admitting: Internal Medicine

## 2014-10-13 ENCOUNTER — Telehealth: Payer: Self-pay | Admitting: *Deleted

## 2014-10-13 DIAGNOSIS — N6001 Solitary cyst of right breast: Secondary | ICD-10-CM | POA: Diagnosis not present

## 2014-10-13 DIAGNOSIS — Z853 Personal history of malignant neoplasm of breast: Secondary | ICD-10-CM | POA: Diagnosis not present

## 2014-10-13 NOTE — Telephone Encounter (Signed)
TC from patient requesting results of [place] of head and neck done on 10/08/14.  # is [phone]

## 2014-10-13 NOTE — Telephone Encounter (Signed)
Done

## 2014-10-18 DIAGNOSIS — R102 Pelvic and perineal pain: Secondary | ICD-10-CM | POA: Diagnosis not present

## 2014-10-19 NOTE — H&P (Signed)
TELIA AMUNDSON  DICTATION # [id number] CSN# [id number]   Margarette Asal, MD 10/19/2014 10:07 AM

## 2014-10-20 NOTE — H&P (Signed)
NAME:  Michaela, Little               ACCOUNT NO.:  [id number]  MEDICAL RECORD NO.:  [id number]  LOCATION:                                FACILITY:  [facility]  PHYSICIAN:  Ralene Bathe. Matthew Saras, M.D.DATE OF BIRTH:  Feb 28, 1961  DATE OF ADMISSION:  10/26/2014 DATE OF DISCHARGE:                             HISTORY & PHYSICAL   CHIEF COMPLAINT:  Pelvic pain.  HISTORY OF PRESENT ILLNESS:  A 54 year old, G10, P5 postmenopausal patient.  I saw originally on October 07, 2014.  At that time, her main complaint was pelvic pain, she is a former patient of Dr. Ulanda Edison.  In the past, she has had a mastectomy for breast cancer.  Her main issues revolved around continued pelvic pain.  Dr. Ulanda Edison did a cone biopsy August 13th, that did not show any significant abnormality.  Around the same time, she had a further evaluation by ultrasound and CT scan, CT dated August 14th, at [facility] showed a few small fibroids, the largest of which was 3.5 cm.  Adnexa unremarkable.  Endometrium 4 mm.  Due to continued problems of pain, she was actually referred to [facility] to be evaluated there, they recommended D and C hysteroscopy, which was attempted by me at [facility] later that year due to severe cervical stenosis could not be accomplished.  Phone conversation with the consulting physicians at [place], they recommended proceeding with hysterectomy and BSO due to her continued problems and inability to sample the cavity.  She presents at this time for LAVH and BSO.  This procedure including specific risks related to bleeding, infection, transfusion, adjacent organ injury, the possible need to complete the surgery by open technique, wound infection, phlebitis along with her expected recovery time reviewed.  She understands and accepts. 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  PAST MEDICAL HISTORY:  Allergies:  TUMS, LISINOPRIL, HCTZ.  CURRENT MEDICATIONS:   Albuterol, Flovent, metronidazole, cyclobenzaprine, omeprazole, promethazine.  PAST SURGICAL HISTORY:  She has had 5 vaginal deliveries.  Two TAB, 3 SAB, left mastectomy in 2010.  FAMILY HISTORY:  Otherwise unremarkable.  PHYSICAL EXAMINATION:  VITAL SIGNS:  Temperature 98.2, blood pressure 120/78. HEENT:  Unremarkable. NECK:  Supple without masses. LUNGS:  Clear. CARDIOVASCULAR:  Regular rate and rhythm without murmurs, rubs, gallops noted. BREASTS:  She has had a prior mastectomy.  ABDOMEN:  Soft, flat, nontender.  Last Pap dated July 14, was normal.  Vulva, vagina, cervix normal.  Uterus mobile normal size.  Adnexa unremarkable.  IMPRESSION:  Continued pelvic pain, small leiomyoma, severe cervical stenosis precluding sampling in the endometrial cavity.  PLAN:  LAVH BSO.  Procedure and risks reviewed as above.     Reality Dejonge M. Matthew Saras, M.D.     [facility]  D:  10/19/2014  T:  10/20/2014  Job:  [id number]

## 2014-10-22 ENCOUNTER — Encounter (HOSPITAL_COMMUNITY)
Admission: RE | Admit: 2014-10-22 | Discharge: 2014-10-22 | Disposition: A | Discharge status: Home / Self Care | Payer: Medicare Other | Source: Ambulatory Visit | Attending: Obstetrics and Gynecology | Admitting: Obstetrics and Gynecology

## 2014-10-22 ENCOUNTER — Encounter (HOSPITAL_COMMUNITY): Payer: Self-pay

## 2014-10-22 ENCOUNTER — Other Ambulatory Visit: Payer: Self-pay

## 2014-10-22 DIAGNOSIS — Z01818 Encounter for other preprocedural examination: Secondary | ICD-10-CM | POA: Insufficient documentation

## 2014-10-22 DIAGNOSIS — R102 Pelvic and perineal pain: Secondary | ICD-10-CM | POA: Insufficient documentation

## 2014-10-22 LAB — BASIC METABOLIC PANEL
ANION GAP: 4 — AB (ref 5–15)
BUN: 12 mg/dL (ref 6–20)
CO2: 25 mmol/L (ref 22–32)
Calcium: 9 mg/dL (ref 8.9–10.3)
Chloride: 108 mmol/L (ref 101–111)
Creatinine, Ser: 0.83 mg/dL (ref 0.44–1.00)
GLUCOSE: 86 mg/dL (ref 65–99)
Potassium: 4.3 mmol/L (ref 3.5–5.1)
Sodium: 137 mmol/L (ref 135–145)

## 2014-10-22 LAB — CBC
HCT: 40.8 % (ref 36.0–46.0)
HEMOGLOBIN: 13.5 g/dL (ref 12.0–15.0)
MCH: 28.1 pg (ref 26.0–34.0)
MCHC: 33.1 g/dL (ref 30.0–36.0)
MCV: 84.8 fL (ref 78.0–100.0)
Platelets: 256 10*3/uL (ref 150–400)
RBC: 4.81 MIL/uL (ref 3.87–5.11)
RDW: 13.8 % (ref 11.5–15.5)
WBC: 6.3 10*3/uL (ref 4.0–10.5)

## 2014-10-22 NOTE — Patient Instructions (Signed)
Your procedure is scheduled on:10/26/14  Enter through the Main Entrance at :6am Pick up desk phone and dial [phone] and inform us of your arrival.  Please call [phone] if you have any problems the morning of surgery.  Remember: Do not eat food or drink liquids after midnight:Monday    You may brush your teeth the morning of surgery.  Take these meds the morning of surgery with a sip of water:Norvasc, Arimiidex, bring inhaler to [facility]  DO NOT wear jewelry, eye make-up, lipstick,body lotion, or dark fingernail polish.  (Polished toes are ok) You may wear deodorant.  If you are to be admitted after surgery, leave suitcase in car until your room has been assigned. Patients discharged on the day of surgery will not be allowed to drive home. Wear loose fitting, comfortable clothes for your ride home.

## 2014-10-25 ENCOUNTER — Encounter (HOSPITAL_COMMUNITY): Payer: Self-pay | Admitting: Anesthesiology

## 2014-10-25 MED ORDER — DEXTROSE 5 % IV SOLN
2.0000 g | INTRAVENOUS | Status: AC
  Administered 2014-10-26: 2 g via INTRAVENOUS
  Filled 2014-10-25: qty 2

## 2014-10-25 NOTE — Anesthesia Preprocedure Evaluation (Addendum)
Anesthesia Evaluation  Patient identified by MRN, date of birth, ID band Patient awake    Reviewed: Allergy & Precautions, NPO status , Patient's Chart, lab work & pertinent test results, reviewed documented beta blocker date and time   Airway Mallampati: II  TM Distance: >3 FB Neck ROM: Full    Dental no notable dental hx. (+) Teeth Intact   Pulmonary shortness of breath and with exertion, asthma , sleep apnea and Continuous Positive Airway Pressure Ventilation , former smoker,  Non compliant with CPAP Hx/o + PPD breath sounds clear to auscultation  Pulmonary exam normal       Cardiovascular hypertension, Pt. on medications Normal cardiovascular examRhythm:Regular Rate:Normal  Diastolic dysfunction   Neuro/Psych  Headaches, Anxiety    GI/Hepatic Neg liver ROS, GERD-  Medicated and Controlled,  Endo/Other  Morbid obesityHx/o thyroid nodule Hyperlipidemia  Renal/GU negative Renal ROS  negative genitourinary   Musculoskeletal  (+) Arthritis -,   Abdominal (+) + obese,   Peds  Hematology  (+) JEHOVAH'S WITNESS  Anesthesia Other Findings   Reproductive/Obstetrics Pelvic pain Uterine Fibroids Cervical dysplasia Cervical stenosis                           Anesthesia Physical Anesthesia Plan  ASA: III  Anesthesia Plan: General   Post-op Pain Management:    Induction: Intravenous, Rapid sequence and Cricoid pressure planned  Airway Management Planned: Oral ETT  Additional Equipment:   Intra-op Plan:   Post-operative Plan: Extubation in OR  Informed Consent: I have reviewed the patients History and Physical, chart, labs and discussed the procedure including the risks, benefits and alternatives for the proposed anesthesia with the patient or authorized representative who has indicated his/her understanding and acceptance.   Dental advisory given  Plan Discussed with: CRNA,  Anesthesiologist and Surgeon  Anesthesia Plan Comments:         Anesthesia Quick Evaluation

## 2014-10-26 ENCOUNTER — Ambulatory Visit (HOSPITAL_COMMUNITY): Payer: Medicare Other | Admitting: Anesthesiology

## 2014-10-26 ENCOUNTER — Encounter (HOSPITAL_COMMUNITY)
Admission: RE | Disposition: A | Discharge status: Home / Self Care | Payer: Self-pay | Source: Ambulatory Visit | Attending: Obstetrics and Gynecology

## 2014-10-26 ENCOUNTER — Inpatient Hospital Stay (HOSPITAL_COMMUNITY)
Admission: RE | Admit: 2014-10-26 | Discharge: 2014-10-28 | DRG: 743 | Disposition: A | Discharge status: Home / Self Care | Payer: Medicare Other | Source: Ambulatory Visit | Attending: Obstetrics and Gynecology | Admitting: Obstetrics and Gynecology

## 2014-10-26 ENCOUNTER — Encounter (HOSPITAL_COMMUNITY): Payer: Self-pay | Admitting: Certified Registered Nurse Anesthetist

## 2014-10-26 DIAGNOSIS — I10 Essential (primary) hypertension: Secondary | ICD-10-CM | POA: Diagnosis present

## 2014-10-26 DIAGNOSIS — Z531 Procedure and treatment not carried out because of patient's decision for reasons of belief and group pressure: Secondary | ICD-10-CM | POA: Diagnosis present

## 2014-10-26 DIAGNOSIS — R102 Pelvic and perineal pain: Secondary | ICD-10-CM | POA: Diagnosis present

## 2014-10-26 DIAGNOSIS — D252 Subserosal leiomyoma of uterus: Principal | ICD-10-CM | POA: Diagnosis present

## 2014-10-26 DIAGNOSIS — F419 Anxiety disorder, unspecified: Secondary | ICD-10-CM | POA: Diagnosis present

## 2014-10-26 DIAGNOSIS — Z842 Family history of other diseases of the genitourinary system: Secondary | ICD-10-CM

## 2014-10-26 DIAGNOSIS — Z9010 Acquired absence of unspecified breast and nipple: Secondary | ICD-10-CM | POA: Diagnosis present

## 2014-10-26 DIAGNOSIS — G4730 Sleep apnea, unspecified: Secondary | ICD-10-CM | POA: Diagnosis present

## 2014-10-26 DIAGNOSIS — N959 Unspecified menopausal and perimenopausal disorder: Secondary | ICD-10-CM | POA: Diagnosis present

## 2014-10-26 DIAGNOSIS — D259 Leiomyoma of uterus, unspecified: Secondary | ICD-10-CM | POA: Diagnosis not present

## 2014-10-26 DIAGNOSIS — M4802 Spinal stenosis, cervical region: Secondary | ICD-10-CM | POA: Diagnosis present

## 2014-10-26 DIAGNOSIS — Z6839 Body mass index (BMI) 39.0-39.9, adult: Secondary | ICD-10-CM

## 2014-10-26 DIAGNOSIS — R51 Headache: Secondary | ICD-10-CM | POA: Diagnosis present

## 2014-10-26 DIAGNOSIS — Z87891 Personal history of nicotine dependence: Secondary | ICD-10-CM

## 2014-10-26 DIAGNOSIS — M1990 Unspecified osteoarthritis, unspecified site: Secondary | ICD-10-CM | POA: Diagnosis not present

## 2014-10-26 DIAGNOSIS — J45909 Unspecified asthma, uncomplicated: Secondary | ICD-10-CM | POA: Diagnosis present

## 2014-10-26 DIAGNOSIS — K219 Gastro-esophageal reflux disease without esophagitis: Secondary | ICD-10-CM | POA: Diagnosis not present

## 2014-10-26 DIAGNOSIS — G8929 Other chronic pain: Secondary | ICD-10-CM | POA: Diagnosis present

## 2014-10-26 DIAGNOSIS — Z853 Personal history of malignant neoplasm of breast: Secondary | ICD-10-CM

## 2014-10-26 DIAGNOSIS — Z9119 Patient's noncompliance with other medical treatment and regimen: Secondary | ICD-10-CM | POA: Diagnosis present

## 2014-10-26 HISTORY — PX: SALPINGOOPHORECTOMY: SHX82

## 2014-10-26 HISTORY — PX: LAPAROSCOPIC ASSISTED VAGINAL HYSTERECTOMY: SHX5398

## 2014-10-26 SURGERY — HYSTERECTOMY, VAGINAL, LAPAROSCOPY-ASSISTED
Anesthesia: General | Site: Abdomen

## 2014-10-26 MED ORDER — NALOXONE HCL 0.4 MG/ML IJ SOLN: 0.4000 mg | INTRAMUSCULAR | Status: DC | PRN

## 2014-10-26 MED ORDER — BUTORPHANOL TARTRATE 1 MG/ML IJ SOLN: 1.0000 mg | INTRAMUSCULAR | Status: DC | PRN

## 2014-10-26 MED ORDER — BUPIVACAINE HCL (PF) 0.25 % IJ SOLN
INTRAMUSCULAR | Status: AC
  Filled 2014-10-26: qty 30

## 2014-10-26 MED ORDER — KETOROLAC TROMETHAMINE 30 MG/ML IJ SOLN
30.0000 mg | Freq: Four times a day (QID) | INTRAMUSCULAR | Status: DC
  Administered 2014-10-26 – 2014-10-27 (×3): 30 mg via INTRAVENOUS
  Filled 2014-10-26 (×3): qty 1

## 2014-10-26 MED ORDER — ROCURONIUM BROMIDE 100 MG/10ML IV SOLN
INTRAVENOUS | Status: DC | PRN
  Administered 2014-10-26: 25 mg via INTRAVENOUS
  Administered 2014-10-26: 20 mg via INTRAVENOUS
  Administered 2014-10-26: 5 mg via INTRAVENOUS

## 2014-10-26 MED ORDER — KETOROLAC TROMETHAMINE 30 MG/ML IJ SOLN
INTRAMUSCULAR | Status: AC
  Filled 2014-10-26: qty 1

## 2014-10-26 MED ORDER — AMLODIPINE BESYLATE 10 MG PO TABS
10.0000 mg | ORAL_TABLET | Freq: Every day | ORAL | Status: DC
  Administered 2014-10-27: 10 mg via ORAL
  Filled 2014-10-26 (×3): qty 1

## 2014-10-26 MED ORDER — MENTHOL 3 MG MT LOZG: 1.0000 | LOZENGE | OROMUCOSAL | Status: DC | PRN

## 2014-10-26 MED ORDER — KETOROLAC TROMETHAMINE 30 MG/ML IJ SOLN
30.0000 mg | Freq: Four times a day (QID) | INTRAMUSCULAR | Status: DC
  Filled 2014-10-26: qty 1

## 2014-10-26 MED ORDER — ONDANSETRON HCL 4 MG/2ML IJ SOLN
INTRAMUSCULAR | Status: AC
  Filled 2014-10-26: qty 2

## 2014-10-26 MED ORDER — SODIUM CHLORIDE 0.9 % IJ SOLN: 9.0000 mL | INTRAMUSCULAR | Status: DC | PRN

## 2014-10-26 MED ORDER — SCOPOLAMINE 1 MG/3DAYS TD PT72
1.0000 | MEDICATED_PATCH | Freq: Once | TRANSDERMAL | Status: DC
  Administered 2014-10-26: 1.5 mg via TRANSDERMAL

## 2014-10-26 MED ORDER — SCOPOLAMINE 1 MG/3DAYS TD PT72
MEDICATED_PATCH | TRANSDERMAL | Status: AC
  Administered 2014-10-26: 1.5 mg via TRANSDERMAL
  Filled 2014-10-26: qty 1

## 2014-10-26 MED ORDER — PROPOFOL 10 MG/ML IV BOLUS
INTRAVENOUS | Status: DC | PRN
  Administered 2014-10-26: 250 mg via INTRAVENOUS

## 2014-10-26 MED ORDER — EPHEDRINE 5 MG/ML INJ
INTRAVENOUS | Status: AC
  Filled 2014-10-26: qty 10

## 2014-10-26 MED ORDER — MEPERIDINE HCL 25 MG/ML IJ SOLN: 6.2500 mg | INTRAMUSCULAR | Status: DC | PRN

## 2014-10-26 MED ORDER — GLYCOPYRROLATE 0.2 MG/ML IJ SOLN
INTRAMUSCULAR | Status: AC
  Filled 2014-10-26: qty 4

## 2014-10-26 MED ORDER — MORPHINE SULFATE 1 MG/ML IV SOLN
INTRAVENOUS | Status: DC
  Administered 2014-10-26: 3 mg via INTRAVENOUS
  Administered 2014-10-26: 11:00:00 via INTRAVENOUS
  Administered 2014-10-26 (×2): 3 mg via INTRAVENOUS
  Administered 2014-10-27: 2 mg via INTRAVENOUS
  Administered 2014-10-27: 1 mg via INTRAVENOUS
  Administered 2014-10-27: 2 mg via INTRAVENOUS
  Filled 2014-10-26: qty 25

## 2014-10-26 MED ORDER — PROPOFOL 10 MG/ML IV BOLUS
INTRAVENOUS | Status: AC
  Filled 2014-10-26: qty 20

## 2014-10-26 MED ORDER — ONDANSETRON HCL 4 MG PO TABS: 4.0000 mg | ORAL_TABLET | Freq: Four times a day (QID) | ORAL | Status: DC | PRN

## 2014-10-26 MED ORDER — GLYCOPYRROLATE 0.2 MG/ML IJ SOLN
INTRAMUSCULAR | Status: DC | PRN
  Administered 2014-10-26: 0.2 mg via INTRAVENOUS
  Administered 2014-10-26: 0.4 mg via INTRAVENOUS

## 2014-10-26 MED ORDER — METOCLOPRAMIDE HCL 5 MG/ML IJ SOLN: 10.0000 mg | Freq: Once | INTRAMUSCULAR | Status: DC | PRN

## 2014-10-26 MED ORDER — GLYCOPYRROLATE 0.2 MG/ML IJ SOLN
INTRAMUSCULAR | Status: AC
  Filled 2014-10-26: qty 1

## 2014-10-26 MED ORDER — NEOSTIGMINE METHYLSULFATE 10 MG/10ML IV SOLN
INTRAVENOUS | Status: AC
  Filled 2014-10-26: qty 1

## 2014-10-26 MED ORDER — SODIUM CHLORIDE 0.9 % IJ SOLN
INTRAMUSCULAR | Status: AC
  Filled 2014-10-26: qty 10

## 2014-10-26 MED ORDER — MIDAZOLAM HCL 2 MG/2ML IJ SOLN
INTRAMUSCULAR | Status: DC | PRN
  Administered 2014-10-26: 2 mg via INTRAVENOUS

## 2014-10-26 MED ORDER — SUCCINYLCHOLINE CHLORIDE 20 MG/ML IJ SOLN
INTRAMUSCULAR | Status: AC
  Filled 2014-10-26: qty 1

## 2014-10-26 MED ORDER — SODIUM CHLORIDE 0.9 % IJ SOLN
INTRAMUSCULAR | Status: DC | PRN
  Administered 2014-10-26: 10 mL

## 2014-10-26 MED ORDER — DIPHENHYDRAMINE HCL 12.5 MG/5ML PO ELIX: 12.5000 mg | ORAL_SOLUTION | Freq: Four times a day (QID) | ORAL | Status: DC | PRN

## 2014-10-26 MED ORDER — ONDANSETRON HCL 4 MG/2ML IJ SOLN
4.0000 mg | Freq: Four times a day (QID) | INTRAMUSCULAR | Status: DC | PRN
Start: 2014-10-26 — End: 2014-10-28

## 2014-10-26 MED ORDER — NEOSTIGMINE METHYLSULFATE 10 MG/10ML IV SOLN
INTRAVENOUS | Status: DC | PRN
  Administered 2014-10-26: 2.5 mg via INTRAVENOUS

## 2014-10-26 MED ORDER — DIPHENHYDRAMINE HCL 50 MG/ML IJ SOLN: 12.5000 mg | Freq: Four times a day (QID) | INTRAMUSCULAR | Status: DC | PRN

## 2014-10-26 MED ORDER — EPHEDRINE SULFATE 50 MG/ML IJ SOLN
INTRAMUSCULAR | Status: DC | PRN
  Administered 2014-10-26: 5 mg via INTRAVENOUS

## 2014-10-26 MED ORDER — SUCCINYLCHOLINE CHLORIDE 20 MG/ML IJ SOLN
INTRAMUSCULAR | Status: DC | PRN
  Administered 2014-10-26: 140 mg via INTRAVENOUS

## 2014-10-26 MED ORDER — FENTANYL CITRATE (PF) 250 MCG/5ML IJ SOLN
INTRAMUSCULAR | Status: AC
  Filled 2014-10-26: qty 25

## 2014-10-26 MED ORDER — FENTANYL CITRATE (PF) 100 MCG/2ML IJ SOLN
INTRAMUSCULAR | Status: AC
  Administered 2014-10-26: 50 ug via INTRAVENOUS
  Filled 2014-10-26: qty 2

## 2014-10-26 MED ORDER — MIDAZOLAM HCL 2 MG/2ML IJ SOLN
INTRAMUSCULAR | Status: AC
  Filled 2014-10-26: qty 4

## 2014-10-26 MED ORDER — DEXAMETHASONE SODIUM PHOSPHATE 4 MG/ML IJ SOLN
INTRAMUSCULAR | Status: AC
  Filled 2014-10-26: qty 1

## 2014-10-26 MED ORDER — DEXTROSE IN LACTATED RINGERS 5 % IV SOLN
INTRAVENOUS | Status: DC
Start: 2014-10-26 — End: 2014-10-28
  Administered 2014-10-26 – 2014-10-27 (×3): via INTRAVENOUS

## 2014-10-26 MED ORDER — ALBUTEROL SULFATE (2.5 MG/3ML) 0.083% IN NEBU: 3.0000 mL | INHALATION_SOLUTION | RESPIRATORY_TRACT | Status: DC | PRN

## 2014-10-26 MED ORDER — KETOROLAC TROMETHAMINE 30 MG/ML IJ SOLN: 30.0000 mg | Freq: Once | INTRAMUSCULAR | Status: DC

## 2014-10-26 MED ORDER — ACETAMINOPHEN 10 MG/ML IV SOLN
1000.0000 mg | Freq: Once | INTRAVENOUS | Status: AC
  Administered 2014-10-26: 1000 mg via INTRAVENOUS
  Filled 2014-10-26: qty 100

## 2014-10-26 MED ORDER — FENTANYL CITRATE (PF) 100 MCG/2ML IJ SOLN
INTRAMUSCULAR | Status: DC | PRN
  Administered 2014-10-26: 50 ug via INTRAVENOUS
  Administered 2014-10-26: 100 ug via INTRAVENOUS
  Administered 2014-10-26 (×2): 50 ug via INTRAVENOUS

## 2014-10-26 MED ORDER — LIDOCAINE HCL (CARDIAC) 20 MG/ML IV SOLN
INTRAVENOUS | Status: DC | PRN
  Administered 2014-10-26: 80 mg via INTRAVENOUS

## 2014-10-26 MED ORDER — BUPIVACAINE HCL (PF) 0.25 % IJ SOLN
INTRAMUSCULAR | Status: DC | PRN
  Administered 2014-10-26: 5 mL

## 2014-10-26 MED ORDER — ROCURONIUM BROMIDE 100 MG/10ML IV SOLN
INTRAVENOUS | Status: AC
  Filled 2014-10-26: qty 1

## 2014-10-26 MED ORDER — ANASTROZOLE 1 MG PO TABS
1.0000 mg | ORAL_TABLET | Freq: Every day | ORAL | Status: DC
  Administered 2014-10-27 – 2014-10-28 (×2): 1 mg via ORAL
  Filled 2014-10-26 (×4): qty 1

## 2014-10-26 MED ORDER — LIDOCAINE HCL (CARDIAC) 20 MG/ML IV SOLN
INTRAVENOUS | Status: AC
  Filled 2014-10-26: qty 5

## 2014-10-26 MED ORDER — ONDANSETRON HCL 4 MG/2ML IJ SOLN: 4.0000 mg | Freq: Four times a day (QID) | INTRAMUSCULAR | Status: DC | PRN

## 2014-10-26 MED ORDER — DEXAMETHASONE SODIUM PHOSPHATE 10 MG/ML IJ SOLN
INTRAMUSCULAR | Status: DC | PRN
  Administered 2014-10-26: 4 mg via INTRAVENOUS

## 2014-10-26 MED ORDER — KETOROLAC TROMETHAMINE 30 MG/ML IJ SOLN
INTRAMUSCULAR | Status: DC | PRN
  Administered 2014-10-26: 30 mg via INTRAVENOUS

## 2014-10-26 MED ORDER — OXYCODONE-ACETAMINOPHEN 5-325 MG PO TABS: 1.0000 | ORAL_TABLET | ORAL | Status: DC | PRN

## 2014-10-26 MED ORDER — FENTANYL CITRATE (PF) 100 MCG/2ML IJ SOLN
25.0000 ug | INTRAMUSCULAR | Status: DC | PRN
  Administered 2014-10-26 (×2): 50 ug via INTRAVENOUS

## 2014-10-26 MED ORDER — ONDANSETRON HCL 4 MG/2ML IJ SOLN
INTRAMUSCULAR | Status: DC | PRN
  Administered 2014-10-26: 4 mg via INTRAVENOUS

## 2014-10-26 MED ORDER — IBUPROFEN 800 MG PO TABS
800.0000 mg | ORAL_TABLET | Freq: Three times a day (TID) | ORAL | Status: DC | PRN
  Administered 2014-10-27 – 2014-10-28 (×3): 800 mg via ORAL
  Filled 2014-10-26 (×3): qty 1

## 2014-10-26 MED ORDER — LACTATED RINGERS IV SOLN
INTRAVENOUS | Status: DC
Start: 2014-10-26 — End: 2014-10-26
  Administered 2014-10-26 (×2): via INTRAVENOUS

## 2014-10-26 SURGICAL SUPPLY — 45 items
CABLE HIGH FREQUENCY MONO STRZ (ELECTRODE) IMPLANT
CATH FOLEY LATEX FREE 14FR (CATHETERS) ×4
CATH FOLEY LF 14FR (CATHETERS) ×4 IMPLANT
CLEANER TIP ELECTROSURG 2X2 (MISCELLANEOUS) ×4 IMPLANT
CLOTH BEACON ORANGE TIMEOUT ST (SAFETY) ×4 IMPLANT
CONT PATH 16OZ SNAP LID 3702 (MISCELLANEOUS) ×4 IMPLANT
COVER BACK TABLE 60X90IN (DRAPES) ×4 IMPLANT
DECANTER SPIKE VIAL GLASS SM (MISCELLANEOUS) ×8 IMPLANT
DRSG COVADERM PLUS 2X2 (GAUZE/BANDAGES/DRESSINGS) IMPLANT
DRSG OPSITE POSTOP 3X4 (GAUZE/BANDAGES/DRESSINGS) ×4 IMPLANT
DRSG OPSITE POSTOP 4X10 (GAUZE/BANDAGES/DRESSINGS) ×4 IMPLANT
DURAPREP 26ML APPLICATOR (WOUND CARE) ×4 IMPLANT
ELECT LIGASURE SHORT 9 REUSE (ELECTRODE) ×4 IMPLANT
ELECT REM PT RETURN 9FT ADLT (ELECTROSURGICAL) ×4
ELECTRODE REM PT RTRN 9FT ADLT (ELECTROSURGICAL) ×2 IMPLANT
GLOVE BIO SURGEON STRL SZ7 (GLOVE) ×8 IMPLANT
GLOVE BIOGEL PI IND STRL 6.5 (GLOVE) ×2 IMPLANT
GLOVE BIOGEL PI IND STRL 7.0 (GLOVE) ×4 IMPLANT
GLOVE BIOGEL PI INDICATOR 6.5 (GLOVE) ×2
GLOVE BIOGEL PI INDICATOR 7.0 (GLOVE) ×4
LIQUID BAND (GAUZE/BANDAGES/DRESSINGS) ×8 IMPLANT
NEEDLE INSUFFLATION 120MM (ENDOMECHANICALS) ×4 IMPLANT
NS IRRIG 1000ML POUR BTL (IV SOLUTION) IMPLANT
PACK LAVH (CUSTOM PROCEDURE TRAY) ×4 IMPLANT
PACK ROBOTIC GOWN (GOWN DISPOSABLE) ×4 IMPLANT
PAD POSITIONER PINK NONSTERILE (MISCELLANEOUS) ×4 IMPLANT
RTRCTR C-SECT PINK 25CM LRG (MISCELLANEOUS) ×4 IMPLANT
SEALER TISSUE G2 CVD JAW 45CM (ENDOMECHANICALS) IMPLANT
SET IRRIG TUBING LAPAROSCOPIC (IRRIGATION / IRRIGATOR) IMPLANT
SPONGE LAP 18X18 X RAY DECT (DISPOSABLE) ×12 IMPLANT
SUT MON AB 2-0 CT1 36 (SUTURE) ×8 IMPLANT
SUT VIC AB 0 CT1 18XCR BRD8 (SUTURE) ×6 IMPLANT
SUT VIC AB 0 CT1 36 (SUTURE) ×4 IMPLANT
SUT VIC AB 0 CT1 8-18 (SUTURE) ×6
SUT VIC AB 3-0 CT1 27 (SUTURE) ×2
SUT VIC AB 3-0 CT1 TAPERPNT 27 (SUTURE) ×2 IMPLANT
SUT VICRYL 0 TIES 12 18 (SUTURE) ×4 IMPLANT
SUT VICRYL 4-0 PS2 18IN ABS (SUTURE) ×4 IMPLANT
TOWEL OR 17X24 6PK STRL BLUE (TOWEL DISPOSABLE) ×8 IMPLANT
TRAY FOLEY CATH SILVER 14FR (SET/KITS/TRAYS/PACK) IMPLANT
TROCAR DILATING TIP 12MM 150MM (ENDOMECHANICALS) ×4 IMPLANT
TROCAR OPTI TIP 5M 100M (ENDOMECHANICALS) ×4 IMPLANT
TROCAR XCEL DIL TIP R 11M (ENDOMECHANICALS) ×4 IMPLANT
WARMER LAPAROSCOPE (MISCELLANEOUS) ×4 IMPLANT
WATER STERILE IRR 1000ML POUR (IV SOLUTION) ×4 IMPLANT

## 2014-10-26 NOTE — Anesthesia Postprocedure Evaluation (Signed)
  Anesthesia Post-op Note  Patient: Michaela Little  Procedure(s) Performed: Procedure(s): HYSTERECTOMY ABDOMINAL  (N/A) BILATERAL SALPINGO OOPHORECTOMY (Bilateral)  Patient Location: Women's Unit  Anesthesia Type:General  Level of Consciousness: awake, alert  and oriented  Airway and Oxygen Therapy: Patient Spontanous Breathing  Post-op Pain: none  Post-op Assessment: Post-op Vital signs reviewed, Patient's Cardiovascular Status Stable, Respiratory Function Stable, Patent Airway, Adequate PO intake and Pain level controlled              Post-op Vital Signs: Reviewed and stable  Last Vitals:  Filed Vitals:   10/26/14 1330  BP: 153/80  Pulse: 68  Temp: 36.9 C  Resp: 16    Complications: No apparent anesthesia complications

## 2014-10-26 NOTE — OR Nursing (Signed)
Switched to Abdominal Hysterectomy at 0800

## 2014-10-26 NOTE — Op Note (Signed)
Preoperative diagnosis: Chronic pelvic pain, leiomyoma  Postoperative diagnosis: Same  Procedure: Diagnostic laparoscopy followed by TAH/BSO  Surgeon: Matthew Saras  Asst.: Morris  EBL: 200 cc  Drains: Foley catheter  Procedure and findings:  The patient taken the operating room after an adequate level of general anesthesia was obtained with the patient supine the abdomen prepped and draped, vagina prepped separately Foley positioned draining clear urine. EUA was carried out the uterus was upper limit of normal size, the cervix was flush with the vagina with extreme cervical stenosis as noted previously. Traction on the cervix however revealed no descent. Attention directed to the abdomen the subumbilical area was infiltrated with quarter percent Marcaine plain, small incision was made in the varies needle was introduced without difficulty. Its intra-abdominal position was verified by pressure water testing. After a 3 L pneumoperitoneum syncopated, lap scopic trocar and sleeve were then introduced without difficulty. There was no evidence of any bleeding or trauma. However, even with her in Trendelenburg visualization the pelvis was difficult. Because of the poor visualization and the lack of descent on vaginal exam decision made proceed with TAH/BSO. Vaginal its was removed, the legs were placed flat, transverse incision made 2 finger breaths above the symphysis carried down the fascia which was incised and extended transversely. Rectus muscles divided in the midline, peritoneum entered superiorly without incident. Alexis retractor was in position, patient placed in Trendelenburg bowel packed superiorly out of the field. Long Kelly clamps were then placed at each utero-ovarian pedicle. Uterus itself was normal size with a 3 cm serosal fibroid at the fundus cul-de-sac was otherwise unremarkable. Starting on the left the LigaSure was used to coagulate and divide the round ligament on the left side posterior  leaf of broad ligament was perforated with the surgeon's finger, the left IP ligament was isolated, after assuring that the course of the ureter was well below, the left IP ligament was clamped divided first free tie followed by suture ligature oh Vicryls. Peritoneum was then carried around to the midline. The exact same repeated on the opposite side. The bladder was then advanced inferiorly with sharp and blunt dissection until it was below the cervical vaginal junction. In sequential manner, the uterine basket or pedicles were skeletonized clamped with the LigaSure and divided. Remaining pedicles including the cardinal ligament uterosacral ligament and cervical vaginal pedicles were clamped divided and suture ligated with oh Vicryls staying close to the uterus. Specimen was thus excised. Cuff was closed with interrupted 0 Vicryls sutures. The pelvis is in irrigated with saline. There were some minimal oozing along the posterior cuff which was coagulated with the Bovie reinspection revealed hemostasis. Small pieces Gelfoam was placed down at the cuff area for assurance to hemostasis. Prior to closure sponge, needle, history precast reported as correct 2 peritoneum was enclose with a running 30 Vicryls suture. 30 Vicryls interrupted sutures used to reapproximate the rectus muscles in the midline 0 PDS was then used from laterally to midline on either side a closed fashion subcutaneous tissue was hemostatic was closed with a running 30 Vicryls suture to close the dead space. 4-0 Monocryl subcuticular closure with a honeycomb dressing clear urine noted in the case she tolerated this well went to recovery room in good condition.  Dictated with ShermanD.

## 2014-10-26 NOTE — Addendum Note (Signed)
Addendum  created 10/26/14 1423 by Jonna Munro, CRNA   Modules edited: Notes Section   Notes Section:  File: [id number]

## 2014-10-26 NOTE — Transfer of Care (Signed)
Immediate Anesthesia Transfer of Care Note  Patient: Michaela Little  Procedure(s) Performed: Procedure(s): HYSTERECTOMY ABDOMINAL  (N/A) BILATERAL SALPINGO OOPHORECTOMY (Bilateral)  Patient Location: PACU  Anesthesia Type:General  Level of Consciousness: awake, alert , oriented and patient cooperative  Airway & Oxygen Therapy: Patient Spontanous Breathing, Patient connected to nasal cannula oxygen and transitioned to nasal CPAP by respiratory in PACU  Post-op Assessment: Report given to RN and Post -op Vital signs reviewed and stable  Post vital signs: Reviewed and stable  Last Vitals:  Filed Vitals:   10/26/14 0728  BP: 599/77    Complications: No apparent anesthesia complications

## 2014-10-26 NOTE — Progress Notes (Signed)
The patient was re-examined with no change in status...she is a JW and refuses all blood products

## 2014-10-26 NOTE — Anesthesia Procedure Notes (Signed)
Procedure Name: Intubation Date/Time: 10/26/2014 7:26 AM Performed by: Raenette Rover Pre-anesthesia Checklist: Patient identified, Emergency Drugs available, Suction available and Patient being monitored Patient Re-evaluated:Patient Re-evaluated prior to inductionOxygen Delivery Method: Circle system utilized Preoxygenation: Pre-oxygenation with 100% oxygen Intubation Type: IV induction Ventilation: Mask ventilation without difficulty and Two handed mask ventilation required Laryngoscope Size: Glidescope, Mac and 3 Grade View: Grade I Tube type: Oral Tube size: 7.0 mm Number of attempts: 1 Airway Equipment and Method: Patient positioned with wedge pillow and Stylet Placement Confirmation: ETT inserted through vocal cords under direct vision,  positive ETCO2,  CO2 detector and breath sounds checked- equal and bilateral Secured at: 21 cm Tube secured with: Tape Dental Injury: Teeth and Oropharynx as per pre-operative assessment  Difficulty Due To: Difficulty was anticipated

## 2014-10-26 NOTE — Anesthesia Postprocedure Evaluation (Signed)
  Anesthesia Post-op Note  Patient: Michaela Little  Procedure(s) Performed: Procedure(s): HYSTERECTOMY ABDOMINAL  (N/A) BILATERAL SALPINGO OOPHORECTOMY (Bilateral)  Patient Location: PACU  Anesthesia Type:General  Level of Consciousness: awake, oriented and sedated  Airway and Oxygen Therapy: Patient Spontanous Breathing and on CPAP  Post-op Pain: mild  Post-op Assessment: Post-op Vital signs reviewed, Patient's Cardiovascular Status Stable, Respiratory Function Stable, Patent Airway, No signs of Nausea or vomiting and Pain level controlled              Post-op Vital Signs: Reviewed and stable  Last Vitals:  Filed Vitals:   10/26/14 1100  BP: 141/73  Pulse: 68  Temp: 36.7 C  Resp: 15    Complications: No apparent anesthesia complications

## 2014-10-27 ENCOUNTER — Ambulatory Visit: Payer: Medicare Other | Admitting: Internal Medicine

## 2014-10-27 ENCOUNTER — Encounter (HOSPITAL_COMMUNITY): Payer: Self-pay | Admitting: Obstetrics and Gynecology

## 2014-10-27 DIAGNOSIS — K219 Gastro-esophageal reflux disease without esophagitis: Secondary | ICD-10-CM | POA: Diagnosis present

## 2014-10-27 DIAGNOSIS — D252 Subserosal leiomyoma of uterus: Secondary | ICD-10-CM | POA: Diagnosis not present

## 2014-10-27 DIAGNOSIS — F419 Anxiety disorder, unspecified: Secondary | ICD-10-CM | POA: Diagnosis present

## 2014-10-27 DIAGNOSIS — Z842 Family history of other diseases of the genitourinary system: Secondary | ICD-10-CM

## 2014-10-27 DIAGNOSIS — I10 Essential (primary) hypertension: Secondary | ICD-10-CM | POA: Diagnosis not present

## 2014-10-27 DIAGNOSIS — G4730 Sleep apnea, unspecified: Secondary | ICD-10-CM | POA: Diagnosis present

## 2014-10-27 DIAGNOSIS — N959 Unspecified menopausal and perimenopausal disorder: Secondary | ICD-10-CM | POA: Diagnosis present

## 2014-10-27 DIAGNOSIS — Z9119 Patient's noncompliance with other medical treatment and regimen: Secondary | ICD-10-CM | POA: Diagnosis present

## 2014-10-27 DIAGNOSIS — Z87891 Personal history of nicotine dependence: Secondary | ICD-10-CM | POA: Diagnosis not present

## 2014-10-27 DIAGNOSIS — Z853 Personal history of malignant neoplasm of breast: Secondary | ICD-10-CM | POA: Diagnosis not present

## 2014-10-27 DIAGNOSIS — Z9010 Acquired absence of unspecified breast and nipple: Secondary | ICD-10-CM | POA: Diagnosis present

## 2014-10-27 DIAGNOSIS — R102 Pelvic and perineal pain: Secondary | ICD-10-CM | POA: Diagnosis present

## 2014-10-27 DIAGNOSIS — G8929 Other chronic pain: Secondary | ICD-10-CM | POA: Diagnosis present

## 2014-10-27 DIAGNOSIS — M4802 Spinal stenosis, cervical region: Secondary | ICD-10-CM | POA: Diagnosis not present

## 2014-10-27 DIAGNOSIS — Z531 Procedure and treatment not carried out because of patient's decision for reasons of belief and group pressure: Secondary | ICD-10-CM | POA: Diagnosis present

## 2014-10-27 DIAGNOSIS — Z6839 Body mass index (BMI) 39.0-39.9, adult: Secondary | ICD-10-CM | POA: Diagnosis not present

## 2014-10-27 DIAGNOSIS — R51 Headache: Secondary | ICD-10-CM | POA: Diagnosis present

## 2014-10-27 DIAGNOSIS — J45909 Unspecified asthma, uncomplicated: Secondary | ICD-10-CM | POA: Diagnosis present

## 2014-10-27 LAB — CBC
HEMATOCRIT: 33.8 % — AB (ref 36.0–46.0)
Hemoglobin: 11 g/dL — ABNORMAL LOW (ref 12.0–15.0)
MCH: 27.5 pg (ref 26.0–34.0)
MCHC: 32.5 g/dL (ref 30.0–36.0)
MCV: 84.5 fL (ref 78.0–100.0)
Platelets: 192 10*3/uL (ref 150–400)
RBC: 4 MIL/uL (ref 3.87–5.11)
RDW: 14.2 % (ref 11.5–15.5)
WBC: 9.3 10*3/uL (ref 4.0–10.5)

## 2014-10-27 MED ORDER — HYDROCODONE-ACETAMINOPHEN 5-325 MG PO TABS
1.0000 | ORAL_TABLET | ORAL | Status: DC | PRN
  Administered 2014-10-27 – 2014-10-28 (×3): 2 via ORAL
  Filled 2014-10-27 (×3): qty 2

## 2014-10-27 NOTE — Progress Notes (Signed)
1 Day Post-Op Procedure(s) (LRB): HYSTERECTOMY ABDOMINAL  (N/A) BILATERAL SALPINGO OOPHORECTOMY (Bilateral)  Subjective: Patient reports tolerating PO.  Rested well w/ CPAP Objective: I have reviewed patient's vital signs, intake and output and labs. CBC    Component Value Date/Time   WBC 9.3 10/27/2014 0535   WBC 4.5 10/01/2014 1049   WBC 8.8 06/16/2014 0915   RBC 4.00 10/27/2014 0535   RBC 4.66 10/01/2014 1049   RBC 5.03 06/16/2014 0915   HGB 11.0* 10/27/2014 0535   HGB 13.0 10/01/2014 1049   HCT 33.8* 10/27/2014 0535   HCT 39.5 10/01/2014 1049   PLT 192 10/27/2014 0535   PLT 244 10/01/2014 1049   MCV 84.5 10/27/2014 0535   MCV 84.8 10/01/2014 1049   MCH 27.5 10/27/2014 0535   MCH 27.9 10/01/2014 1049   MCH 27.4 06/16/2014 0915   MCHC 32.5 10/27/2014 0535   MCHC 32.9 10/01/2014 1049   MCHC 33.3 06/16/2014 0915   RDW 14.2 10/27/2014 0535   RDW 13.9 10/01/2014 1049   RDW 15.3 06/16/2014 0915   LYMPHSABS 1.5 10/01/2014 1049   LYMPHSABS 2.5 06/16/2014 0915   LYMPHSABS 1.5 11/19/2013 1223   MONOABS 0.4 10/01/2014 1049   MONOABS 0.4 11/19/2013 1223   EOSABS 0.2 10/01/2014 1049   EOSABS 0.2 11/19/2013 1223   BASOSABS 0.0 10/01/2014 1049   BASOSABS 0.0 06/16/2014 0915   BASOSABS 0.0 11/19/2013 1223     abd + BS, bandage dry  Assessment: s/p Procedure(s): HYSTERECTOMY ABDOMINAL  (N/A) BILATERAL SALPINGO OOPHORECTOMY (Bilateral): stable  Plan: Advance diet Encourage ambulation Advance to PO medication Discontinue IV fluids     Davontay Watlington M 10/27/2014, 7:18 AM

## 2014-10-28 MED ORDER — IBUPROFEN 800 MG PO TABS: 800.0000 mg | ORAL_TABLET | Freq: Three times a day (TID) | ORAL | Status: DC | PRN

## 2014-10-28 MED ORDER — HYDROCODONE-IBUPROFEN 7.5-200 MG PO TABS: 1.0000 | ORAL_TABLET | Freq: Three times a day (TID) | ORAL | Status: DC | PRN

## 2014-10-28 NOTE — Discharge Summary (Signed)
Physician Discharge Summary  Patient ID: Michaela Little MRN: [id number] DOB/AGE: [birth year] 54 y.o.  Admit date: 10/26/2014 Discharge date: 10/28/2014  Admission Diagnoses:pelvic pain  Discharge Diagnoses: same + TAHBSO Active Problems:   Pelvic pain in female   FH: TAH-BSO (total abdominal hysterectomy and bilateral salpingo-oophorectomy)   Discharged Condition: good  Hospital Course: adm for DL>>TAHBSO, was D/C on POD #2, afeb, tol PO, Inc C/D  Consults: None  Significant Diagnostic Studies: labs:  CBC    Component Value Date/Time   WBC 9.3 10/27/2014 0535   WBC 4.5 10/01/2014 1049   WBC 8.8 06/16/2014 0915   RBC 4.00 10/27/2014 0535   RBC 4.66 10/01/2014 1049   RBC 5.03 06/16/2014 0915   HGB 11.0* 10/27/2014 0535   HGB 13.0 10/01/2014 1049   HCT 33.8* 10/27/2014 0535   HCT 39.5 10/01/2014 1049   PLT 192 10/27/2014 0535   PLT 244 10/01/2014 1049   MCV 84.5 10/27/2014 0535   MCV 84.8 10/01/2014 1049   MCH 27.5 10/27/2014 0535   MCH 27.9 10/01/2014 1049   MCH 27.4 06/16/2014 0915   MCHC 32.5 10/27/2014 0535   MCHC 32.9 10/01/2014 1049   MCHC 33.3 06/16/2014 0915   RDW 14.2 10/27/2014 0535   RDW 13.9 10/01/2014 1049   RDW 15.3 06/16/2014 0915   LYMPHSABS 1.5 10/01/2014 1049   LYMPHSABS 2.5 06/16/2014 0915   LYMPHSABS 1.5 11/19/2013 1223   MONOABS 0.4 10/01/2014 1049   MONOABS 0.4 11/19/2013 1223   EOSABS 0.2 10/01/2014 1049   EOSABS 0.2 11/19/2013 1223   BASOSABS 0.0 10/01/2014 1049   BASOSABS 0.0 06/16/2014 0915   BASOSABS 0.0 11/19/2013 1223      Treatments: surgery: DL>>TAHBSO  Discharge Exam: Blood pressure 137/68, pulse 55, temperature 97.8 F (36.6 C), temperature source Oral, resp. rate 18, height 5\' 7"  (1.702 m), weight 253 lb (114.76 kg), last menstrual period 02/25/2009, SpO2 97 %. Incision/Wound:c/D  Disposition: 01-Home or Self Care     Medication List    STOP taking these medications        diphenhydrAMINE 25 MG tablet  Commonly  known as:  BENADRYL     loratadine 10 MG tablet  Commonly known as:  CLARITIN     metroNIDAZOLE 500 MG tablet  Commonly known as:  FLAGYL     RA MELATONIN/B-6 PO      TAKE these medications        amLODipine 10 MG tablet  Commonly known as:  NORVASC  Take 1 tablet (10 mg total) by mouth every morning.     anastrozole 1 MG tablet  Commonly known as:  ARIMIDEX  Take 1 mg by mouth daily.     CALCIUM SOFT CHEWS PO  Take 600 mg by mouth. With vitamin D     HYDROcodone-ibuprofen 7.5-200 MG per tablet  Commonly known as:  VICOPROFEN  Take 1 tablet by mouth every 8 (eight) hours as needed for moderate pain.     ibuprofen 800 MG tablet  Commonly known as:  ADVIL,MOTRIN  Take 1 tablet (800 mg total) by mouth every 8 (eight) hours as needed for moderate pain (mild pain).     VENTOLIN HFA 108 (90 BASE) MCG/ACT inhaler  Generic drug:  albuterol  Inhale 2 puffs into the lungs every 4 (four) hours as needed for wheezing or shortness of breath.           Follow-up Information    Follow up with Margarette Asal, MD. Schedule an appointment as  soon as possible for a visit in 2 weeks.   Specialty:  Obstetrics and Gynecology   Contact information:   [address] [place] [address] [phone]       Signed: Margarette Asal 10/28/2014, 9:19 AM

## 2014-10-28 NOTE — Progress Notes (Signed)
Chaplain spoke to patient during routine pastoral visit in hospital.  Patient reports she is scheduled for discharge to home as soon as discharge paperwork is completed.  She reports she was satisfied with all the care she received and has good family support when she goes home. Chaplain offered words of encouragement and prayer of peace and continued healing. Chaplain Yaakov Guthrie [phone]

## 2014-11-03 ENCOUNTER — Encounter (HOSPITAL_COMMUNITY): Payer: Self-pay | Admitting: *Deleted

## 2014-11-03 ENCOUNTER — Inpatient Hospital Stay (HOSPITAL_COMMUNITY)
Admission: AD | Admit: 2014-11-03 | Discharge: 2014-11-03 | Disposition: A | Discharge status: Home / Self Care | Payer: Medicare Other | Source: Ambulatory Visit | Attending: Obstetrics and Gynecology | Admitting: Obstetrics and Gynecology

## 2014-11-03 DIAGNOSIS — K5900 Constipation, unspecified: Secondary | ICD-10-CM | POA: Diagnosis not present

## 2014-11-03 DIAGNOSIS — K649 Unspecified hemorrhoids: Secondary | ICD-10-CM | POA: Diagnosis not present

## 2014-11-03 DIAGNOSIS — Z87891 Personal history of nicotine dependence: Secondary | ICD-10-CM | POA: Insufficient documentation

## 2014-11-03 DIAGNOSIS — R109 Unspecified abdominal pain: Secondary | ICD-10-CM

## 2014-11-03 DIAGNOSIS — R10819 Abdominal tenderness, unspecified site: Secondary | ICD-10-CM | POA: Diagnosis not present

## 2014-11-03 DIAGNOSIS — K2970 Gastritis, unspecified, without bleeding: Secondary | ICD-10-CM | POA: Diagnosis not present

## 2014-11-03 DIAGNOSIS — Z853 Personal history of malignant neoplasm of breast: Secondary | ICD-10-CM | POA: Insufficient documentation

## 2014-11-03 DIAGNOSIS — G8918 Other acute postprocedural pain: Secondary | ICD-10-CM | POA: Diagnosis not present

## 2014-11-03 LAB — CBC
HCT: 37.4 % (ref 36.0–46.0)
HEMOGLOBIN: 12.1 g/dL (ref 12.0–15.0)
MCH: 27.3 pg (ref 26.0–34.0)
MCHC: 32.4 g/dL (ref 30.0–36.0)
MCV: 84.4 fL (ref 78.0–100.0)
PLATELETS: 280 10*3/uL (ref 150–400)
RBC: 4.43 MIL/uL (ref 3.87–5.11)
RDW: 13.8 % (ref 11.5–15.5)
WBC: 8.6 10*3/uL (ref 4.0–10.5)

## 2014-11-03 LAB — COMPREHENSIVE METABOLIC PANEL
ALT: 14 U/L (ref 14–54)
ANION GAP: 8 (ref 5–15)
AST: 16 U/L (ref 15–41)
Albumin: 3.9 g/dL (ref 3.5–5.0)
Alkaline Phosphatase: 110 U/L (ref 38–126)
BUN: 14 mg/dL (ref 6–20)
CO2: 27 mmol/L (ref 22–32)
Calcium: 9.3 mg/dL (ref 8.9–10.3)
Chloride: 105 mmol/L (ref 101–111)
Creatinine, Ser: 0.77 mg/dL (ref 0.44–1.00)
GFR calc Af Amer: >60 mL/min (ref >60)
GFR calc non Af Amer: >60 mL/min (ref >60)
Glucose, Bld: 121 mg/dL — ABNORMAL HIGH (ref 65–99)
POTASSIUM: 4.6 mmol/L (ref 3.5–5.1)
Sodium: 140 mmol/L (ref 135–145)
TOTAL PROTEIN: 7.7 g/dL (ref 6.5–8.1)
Total Bilirubin: 0.9 mg/dL (ref 0.3–1.2)

## 2014-11-03 LAB — URINE MICROSCOPIC-ADD ON

## 2014-11-03 LAB — URINALYSIS, ROUTINE W REFLEX MICROSCOPIC
Bilirubin Urine: NEGATIVE
GLUCOSE, UA: NEGATIVE mg/dL
Ketones, ur: NEGATIVE mg/dL
Leukocytes, UA: NEGATIVE
Nitrite: NEGATIVE
Protein, ur: NEGATIVE mg/dL
SPECIFIC GRAVITY, URINE: 1.02 (ref 1.005–1.030)
Urobilinogen, UA: 2 mg/dL — ABNORMAL HIGH (ref 0.0–1.0)
pH: 7 (ref 5.0–8.0)

## 2014-11-03 MED ORDER — KETOROLAC TROMETHAMINE 60 MG/2ML IM SOLN
60.0000 mg | Freq: Once | INTRAMUSCULAR | Status: AC
  Administered 2014-11-03: 60 mg via INTRAMUSCULAR
  Filled 2014-11-03: qty 2

## 2014-11-03 MED ORDER — DOCUSATE SODIUM 100 MG PO CAPS: 100.0000 mg | ORAL_CAPSULE | Freq: Two times a day (BID) | ORAL | Status: DC

## 2014-11-03 MED ORDER — HYDROCORTISONE 2.5 % RE CREA: TOPICAL_CREAM | RECTAL | Status: DC

## 2014-11-03 MED ORDER — POLYETHYLENE GLYCOL 3350 17 GM/SCOOP PO POWD: 1.0000 | Freq: Once | ORAL | Status: DC

## 2014-11-03 MED ORDER — HYDROCORTISONE 2.5 % RE CREA
TOPICAL_CREAM | Freq: Once | RECTAL | Status: AC
  Administered 2014-11-03: 22:00:00 via RECTAL
  Filled 2014-11-03: qty 28.35

## 2014-11-03 NOTE — MAU Provider Note (Signed)
History     CSN: [id number]  Arrival date and time: 11/03/14 0539   First Provider Initiated Contact with Patient 11/03/14 1840      Chief Complaint  Patient presents with  . Abdominal Pain  . Urinary Tract Infection   HPI  Pt is not pregnant post op TAH/BSO(for abdominal pain) on 8/2 by Dr. Matthew Saras; pt was d/c home on 10/28/2014 without any complications. Pt has hx of left breast cancer in 2010 with mastectomy. Presents with severe lower abdominal pain today and constipation.   Pt had a small bowel movement on 8/5 and another one on 8/8/ which was hard. Pt has been taking Miralax and Colace 2 times daily for 3 days Pt is passing gas and denies fever, nausea or vomiting. Pt has had chills Pt has been taking motrin and hydrocodone/tylenol for pain  Pt thinks her constipation is secondary to her pain med  Editor: Davy Pique, RN (Registered Nurse)     Expand All Collapse All   Patient presents via EMS stretcher stating that she had a hysterectomy on 10/26/14 and c/o abdominal pain and painful urination since yesterday. Denies discharge but has noticed a scant amount of bleeding.        Past Medical History  Diagnosis Date  . Uterine fibroid   . Hyperlipidemia   . Hypertension   . Asthma   . Seasonal allergies   . Anxiety   . Headache(784.0)   . History of breast cancer     2010--  LEFT  s/p  mastectomy (in [place]) AND CHEMORADIATION--  NO RECURRENCE  . GERD (gastroesophageal reflux disease)   . Cervical stenosis (uterine cervix)   . Arthritis     hands, knees, hips  . Positive H. pylori test     08-05-2013  . Pelvic pain in female   . History of cervical dysplasia   . Refusal of blood transfusions as patient is Jehovah's Witness   . Dyspnea on exertion   . Wears glasses   . History of TB skin testing     [place]--  [place]/ MEDS  . OSA (obstructive sleep apnea) moderate osa per study  09/2010    CPAP  , NOT USING ON REGULAR BASIS    Past Surgical History   Procedure Laterality Date  . Eye surgery  1961    right  . Cervical conization w/bx  2012   in  [place]  . Colonoscopy  2011    normal per patient - [place]  . Cholecystectomy N/A 05/14/2012    Procedure: LAPAROSCOPIC CHOLECYSTECTOMY;  Surgeon: Ralene Ok, MD;  Location: [facility];  Service: General;  Laterality: N/A;  . Dilation and curettage of uterus  10/15/2011    Procedure: DILATATION AND CURETTAGE;  Surgeon: Melina Schools, MD;  Location: [facility];  Service: Gynecology;  Laterality: N/A;  Conization &  endocervical curettings  . Cardiovascular stress test  10-29-2012    low risk perfusion study/  no significant reversibity/ ef 66%/  normal wall motion  . Transthoracic echocardiogram  10-29-2012    mild lvh/  ef 60-65%/  grade II diastolic dysfunction/  trivial mr  &  tr  . Mastectomy Left 11/2008  in [place]   . Examination under anesthesia N/A 08/20/2013    Procedure: EXAM UNDER ANESTHESIA;  Surgeon: Margarette Asal, MD;  Location: [facility]  Service: Gynecology;  Laterality: N/A;  . Laparoscopic assisted vaginal hysterectomy N/A  10/26/2014    Procedure: HYSTERECTOMY ABDOMINAL ;  Surgeon: Molli Posey, MD;  Location: [facility];  Service: Gynecology;  Laterality: N/A;  . Salpingoophorectomy Bilateral 10/26/2014    Procedure: BILATERAL SALPINGO OOPHORECTOMY;  Surgeon: Molli Posey, MD;  Location: [facility];  Service: Gynecology;  Laterality: Bilateral;    Family History  Problem Relation Age of Onset  . Breast cancer Mother   . Colon cancer Mother   . Hypotension Mother   . Asthma Mother   . Diabetes type II Mother   . Arthritis Mother   . Clotting disorder Mother   . Cancer Mother     breast/colon  . Mental illness Brother   . Heart disease Brother   . Cerebral palsy Daughter   . Emphysema Brother     never smoker  . Colon cancer Maternal Aunt   . Cancer Maternal Aunt     colon  . Colon cancer Maternal Uncle   . Cancer Maternal Uncle     colon   . Esophageal cancer Neg Hx   . Rectal cancer Neg Hx   . Stomach cancer Neg Hx   . Thyroid disease Neg Hx     Social History  Substance Use Topics  . Smoking status: Former Smoker -- 0.30 packs/day for 10 years    Types: Cigarettes    Quit date: 03/26/1988  . Smokeless tobacco: Never Used  . Alcohol Use: No    Allergies:  Allergies  Allergen Reactions  . Adhesive [Tape] Itching and Other (See Comments)    REDDNESS  . Calcium Carbonate Antacid Hives    Fruit flavored Tums   . Hctz [Hydrochlorothiazide] Itching  . Latex Itching  . Lisinopril Itching  . Losartan Potassium Other (See Comments)    Makes her feel "bad"    Prescriptions prior to admission  Medication Sig Dispense Refill Last Dose  . albuterol (VENTOLIN HFA) 108 (90 BASE) MCG/ACT inhaler Inhale 2 puffs into the lungs every 4 (four) hours as needed for wheezing or shortness of breath.    rescue  . amLODipine (NORVASC) 10 MG tablet Take 1 tablet (10 mg total) by mouth every morning. 90 tablet 1 10/26/2014 at 0500  . anastrozole (ARIMIDEX) 1 MG tablet Take 1 mg by mouth daily.   10/26/2014 at 0500  . Calcium-Vitamin D-Vitamin K (CALCIUM SOFT CHEWS PO) Take 600 mg by mouth. With vitamin D   Taking  . HYDROcodone-ibuprofen (VICOPROFEN) 7.5-200 MG per tablet Take 1 tablet by mouth every 8 (eight) hours as needed for moderate pain. 30 tablet 0   . ibuprofen (ADVIL,MOTRIN) 800 MG tablet Take 1 tablet (800 mg total) by mouth every 8 (eight) hours as needed for moderate pain (mild pain). 30 tablet 1     Review of Systems  Constitutional: Positive for chills. Negative for fever.  Gastrointestinal: Positive for abdominal pain and constipation. Negative for nausea and vomiting.  Genitourinary:       Pressure pain with urination   Physical Exam   Blood pressure 143/82, pulse 92, temperature 98.6 F (37 C), temperature source Oral, resp. rate 24, height 5\' 7"  (1.702 m), weight 253 lb (114.76 kg), last menstrual period  02/25/2009.  Physical Exam  Nursing note and vitals reviewed. Constitutional: She is oriented to person, place, and time. She appears well-developed and well-nourished. She appears distressed.  Uncomfortable appearing  HENT:  Head: Normocephalic.  Eyes: Pupils are equal, round, and reactive to light.  Neck: Normal range of motion. Neck supple.  Cardiovascular: Normal  rate.   Respiratory: Effort normal.  GI: Soft. Bowel sounds are normal. She exhibits no distension. There is tenderness. There is no rebound and no guarding.  Low transverse incision clean and dry  Genitourinary:  Mons diffusely tender- no redness or firmness-  Vagina clean small amount of blood at cuff mildy tender bimanual 2 edematous external hemorrhoids noted  Musculoskeletal: Normal range of motion.  Neurological: She is alert and oriented to person, place, and time.  Skin: Skin is warm and dry.  Psychiatric: She has a normal mood and affect.    MAU Course  Procedures Discussed with Dr. Matthew Saras- will give SS enema if labs OK Results for orders placed or performed during the hospital encounter of 11/03/14 (from the past 24 hour(s))  CBC     Status: None   Collection Time: 11/03/14  7:00 PM  Result Value Ref Range   WBC 8.6 4.0 - 10.5 K/uL   RBC 4.43 3.87 - 5.11 MIL/uL   Hemoglobin 12.1 12.0 - 15.0 g/dL   HCT 37.4 36.0 - 46.0 %   MCV 84.4 78.0 - 100.0 fL   MCH 27.3 26.0 - 34.0 pg   MCHC 32.4 30.0 - 36.0 g/dL   RDW 13.8 11.5 - 15.5 %   Platelets 280 150 - 400 K/uL  Comprehensive metabolic panel     Status: Abnormal   Collection Time: 11/03/14  7:00 PM  Result Value Ref Range   Sodium 140 135 - 145 mmol/L   Potassium 4.6 3.5 - 5.1 mmol/L   Chloride 105 101 - 111 mmol/L   CO2 27 22 - 32 mmol/L   Glucose, Bld 121 (H) 65 - 99 mg/dL   BUN 14 6 - 20 mg/dL   Creatinine, Ser 0.77 0.44 - 1.00 mg/dL   Calcium 9.3 8.9 - 10.3 mg/dL   Total Protein 7.7 6.5 - 8.1 g/dL   Albumin 3.9 3.5 - 5.0 g/dL   AST 16 15 -  41 U/L   ALT 14 14 - 54 U/L   Alkaline Phosphatase 110 38 - 126 U/L   Total Bilirubin 0.9 0.3 - 1.2 mg/dL   GFR calc non Af Amer >60 >60 mL/min   GFR calc Af Amer >60 >60 mL/min   Anion gap 8 5 - 15   Results for orders placed or performed during the hospital encounter of 11/03/14 (from the past 24 hour(s))  CBC     Status: None   Collection Time: 11/03/14  7:00 PM  Result Value Ref Range   WBC 8.6 4.0 - 10.5 K/uL   RBC 4.43 3.87 - 5.11 MIL/uL   Hemoglobin 12.1 12.0 - 15.0 g/dL   HCT 37.4 36.0 - 46.0 %   MCV 84.4 78.0 - 100.0 fL   MCH 27.3 26.0 - 34.0 pg   MCHC 32.4 30.0 - 36.0 g/dL   RDW 13.8 11.5 - 15.5 %   Platelets 280 150 - 400 K/uL  Comprehensive metabolic panel     Status: Abnormal   Collection Time: 11/03/14  7:00 PM  Result Value Ref Range   Sodium 140 135 - 145 mmol/L   Potassium 4.6 3.5 - 5.1 mmol/L   Chloride 105 101 - 111 mmol/L   CO2 27 22 - 32 mmol/L   Glucose, Bld 121 (H) 65 - 99 mg/dL   BUN 14 6 - 20 mg/dL   Creatinine, Ser 0.77 0.44 - 1.00 mg/dL   Calcium 9.3 8.9 - 10.3 mg/dL   Total Protein 7.7 6.5 -  8.1 g/dL   Albumin 3.9 3.5 - 5.0 g/dL   AST 16 15 - 41 U/L   ALT 14 14 - 54 U/L   Alkaline Phosphatase 110 38 - 126 U/L   Total Bilirubin 0.9 0.3 - 1.2 mg/dL   GFR calc non Af Amer >60 >60 mL/min   GFR calc Af Amer >60 >60 mL/min   Anion gap 8 5 - 15  Urinalysis, Routine w reflex microscopic (not at [facility])     Status: Abnormal   Collection Time: 11/03/14  8:20 PM  Result Value Ref Range   Color, Urine YELLOW YELLOW   APPearance CLEAR CLEAR   Specific Gravity, Urine 1.020 1.005 - 1.030   pH 7.0 5.0 - 8.0   Glucose, UA NEGATIVE NEGATIVE mg/dL   Hgb urine dipstick TRACE (A) NEGATIVE   Bilirubin Urine NEGATIVE NEGATIVE   Ketones, ur NEGATIVE NEGATIVE mg/dL   Protein, ur NEGATIVE NEGATIVE mg/dL   Urobilinogen, UA 2.0 (H) 0.0 - 1.0 mg/dL   Nitrite NEGATIVE NEGATIVE   Leukocytes, UA NEGATIVE NEGATIVE  Urine microscopic-add on     Status: None    Collection Time: 11/03/14  8:20 PM  Result Value Ref Range   RBC / HPF 0-2 <3 RBC/hpf   Bacteria, UA RARE RARE   Urine-Other MUCOUS PRESENT   pt had bowel movement when got urine specimen but still wanted an enema Pt got good results Pt states it still hurts to urinate- pressure Will send urine for culture Toradol 60mg  IM given Anusol HC cream Dr. Matthew Saras aware of pt's status  Urine culture sent Assessment and Plan  Post op abdominal pain- continue pain medicine as prescribed- could take Benadryl or Tylenol PM tonight Hemorrhoids- anusol HC cream Constipation- Miralax and colace, high fiber diet F/u with Dr. Matthew Saras for post op appointment- sooner if has increase in pain, fever or chills, nausea/vomiting    Barnaby Rippeon 11/03/2014, 6:46 PM

## 2014-11-03 NOTE — MAU Note (Signed)
Patient presents via EMS stretcher stating that she had a hysterectomy on 10/26/14 and c/o abdominal pain and painful urination since yesterday. Denies discharge but has noticed a scant amount of bleeding.

## 2014-11-03 NOTE — MAU Note (Signed)
Unable to catch a urine specimen while in the bathroom and states she will try again later.

## 2014-11-05 LAB — URINE CULTURE

## 2014-11-10 DIAGNOSIS — Z09 Encounter for follow-up examination after completed treatment for conditions other than malignant neoplasm: Secondary | ICD-10-CM | POA: Diagnosis not present

## 2014-11-10 DIAGNOSIS — D509 Iron deficiency anemia, unspecified: Secondary | ICD-10-CM | POA: Diagnosis not present

## 2014-11-12 ENCOUNTER — Encounter: Payer: Self-pay | Admitting: Internal Medicine

## 2014-11-12 ENCOUNTER — Ambulatory Visit (INDEPENDENT_AMBULATORY_CARE_PROVIDER_SITE_OTHER): Payer: Medicare Other | Admitting: Internal Medicine

## 2014-11-12 VITALS — BP 130/76 | HR 60 | Temp 97.9°F | Resp 18 | Ht 67.0 in | Wt 265.4 lb

## 2014-11-12 DIAGNOSIS — Z9079 Acquired absence of other genital organ(s): Secondary | ICD-10-CM

## 2014-11-12 DIAGNOSIS — Z90710 Acquired absence of both cervix and uterus: Secondary | ICD-10-CM

## 2014-11-12 DIAGNOSIS — J45909 Unspecified asthma, uncomplicated: Secondary | ICD-10-CM | POA: Diagnosis not present

## 2014-11-12 DIAGNOSIS — I10 Essential (primary) hypertension: Secondary | ICD-10-CM | POA: Diagnosis not present

## 2014-11-12 DIAGNOSIS — Z90722 Acquired absence of ovaries, bilateral: Secondary | ICD-10-CM | POA: Diagnosis not present

## 2014-11-12 DIAGNOSIS — Z853 Personal history of malignant neoplasm of breast: Secondary | ICD-10-CM | POA: Diagnosis not present

## 2014-11-12 DIAGNOSIS — J301 Allergic rhinitis due to pollen: Secondary | ICD-10-CM | POA: Diagnosis not present

## 2014-11-12 NOTE — Patient Instructions (Addendum)
CANCEL SEPTEMBER 7TH APPT!  Continue other medications as ordered  No stairs until cleared by GYN. No lifting > gallon of milk until cleared by GYN  Follow up with GYN as scheduled  Follow up in 4 mos for routine visit

## 2014-11-12 NOTE — Progress Notes (Signed)
Patient ID: [name], female   DOB: 05-06-1960, 54 y.o.   MRN: [id number]    Location:    [facility]    Place of Service:   OFFICE  Chief Complaint  Patient presents with  . Medical Management of Chronic Issues    4 month follow-up    HPI:  54 yo female seen today for f/u. She is s/p lap -->open TAH/BSO on 10/26/14. She reports she is recovering well. Still has some abdominal pain. She feels numbness at incision site. No d/c. No f/c, N/V.  She had significant constipation after procedure but that has improved with bowel regimen. Her granddaughter is present today. She rode SCAT to get to OV today  HTN - takes amlodipine. No new swelling, CP or SOB  Hyperlipidemia - diet controlled; makes healthy food choices  Seasonal allergy/asthma - stable. No recent sinusitis. She has not used HFA recently  Hx breast CA - she takes arimidex. Occasional hot flashes  Past Medical History  Diagnosis Date  . Uterine fibroid   . Hyperlipidemia   . Hypertension   . Asthma   . Seasonal allergies   . Anxiety   . Headache(784.0)   . History of breast cancer     2010--  LEFT  s/p  mastectomy (in [place]) AND CHEMORADIATION--  NO RECURRENCE  . GERD (gastroesophageal reflux disease)   . Cervical stenosis (uterine cervix)   . Arthritis     hands, knees, hips  . Positive H. pylori test     08-05-2013  . Pelvic pain in female   . History of cervical dysplasia   . Refusal of blood transfusions as patient is Jehovah's Witness   . Dyspnea on exertion   . Wears glasses   . History of TB skin testing     [place]--  [place] MEDS  . OSA (obstructive sleep apnea) moderate osa per study  09/2010    CPAP  , NOT USING ON REGULAR BASIS    Past Surgical History  Procedure Laterality Date  . Eye surgery  1961    right  . Cervical conization w/bx  2012   in  [place]  . Colonoscopy  2011    normal per patient - [place]  . Cholecystectomy N/A 05/14/2012    Procedure: LAPAROSCOPIC CHOLECYSTECTOMY;  Surgeon: Ralene Ok,  MD;  Location: [facility];  Service: General;  Laterality: N/A;  . Dilation and curettage of uterus  10/15/2011    Procedure: DILATATION AND CURETTAGE;  Surgeon: Melina Schools, MD;  Location: [facility];  Service: Gynecology;  Laterality: N/A;  Conization &  endocervical curettings  . Cardiovascular stress test  10-29-2012    low risk perfusion study/  no significant reversibity/ ef 66%/  normal wall motion  . Transthoracic echocardiogram  10-29-2012    mild lvh/  ef 60-65%/  grade II diastolic dysfunction/  trivial mr  &  tr  . Mastectomy Left 11/2008  in [place]   . Examination under anesthesia N/A 08/20/2013    Procedure: EXAM UNDER ANESTHESIA;  Surgeon: Margarette Asal, MD;  Location: [facility];  Service: Gynecology;  Laterality: N/A;  . Laparoscopic assisted vaginal hysterectomy N/A 10/26/2014    Procedure: HYSTERECTOMY ABDOMINAL ;  Surgeon: Molli Posey, MD;  Location: [facility];  Service: Gynecology;  Laterality: N/A;  . Salpingoophorectomy Bilateral 10/26/2014    Procedure: BILATERAL SALPINGO OOPHORECTOMY;  Surgeon: Molli Posey, MD;  Location: [facility];  Service: Gynecology;  Laterality: Bilateral;    Patient Care Team: Gildardo Cranker, DO as PCP - General (Internal Medicine) Blanchie Serve, MD as Referring Physician (Internal Medicine) Chauncey Cruel, MD as Consulting Physician (Oncology)  Social History   Social History  . Marital Status: Single    Spouse Name: N/A  . Number of Children: 5  . Years of Education: N/A   Occupational History  . Disabled    Social History Main Topics  . Smoking status: Former Smoker -- 0.30 packs/day for 10 years    Types: Cigarettes    Quit date: 03/26/1988  . Smokeless tobacco: Never Used  . Alcohol Use: No  . Drug Use: No  . Sexual Activity: Not Currently   Other Topics Concern  . Not on file   Social History Narrative     reports that she quit smoking about 26 years ago. Her smoking use included  Cigarettes. She has a 3 pack-year smoking history. She has never used smokeless tobacco. She reports that she does not drink alcohol or use illicit drugs.  Allergies  Allergen Reactions  . Adhesive [Tape] Itching and Other (See Comments)    REDDNESS  . Calcium Carbonate Antacid Hives    Fruit flavored Tums   . Hctz [Hydrochlorothiazide] Itching  . Latex Itching  . Lisinopril Itching  . Losartan Potassium Other (See Comments)    Makes her feel "bad"    Medications: Patient's Medications  New Prescriptions   No medications on file  Previous Medications   ALBUTEROL (VENTOLIN HFA) 108 (90 BASE) MCG/ACT INHALER    Inhale 2 puffs into the lungs every 4 (four) hours as needed for wheezing or shortness of breath.    AMLODIPINE (NORVASC) 10 MG TABLET    Take 1 tablet (10 mg total) by mouth every morning.   ANASTROZOLE (ARIMIDEX) 1 MG TABLET    Take 1 mg by mouth daily.   DOCUSATE SODIUM (COLACE) 100 MG CAPSULE    Take 1 capsule (100 mg total) by mouth every 12 (twelve) hours.   HYDROCODONE-IBUPROFEN (VICOPROFEN) 7.5-200 MG PER TABLET    Take 1 tablet by mouth every 8 (eight) hours as needed for moderate pain.   HYDROCORTISONE (ANUSOL-HC) 2.5 % RECTAL CREAM    Apply rectally 2 times daily   IBUPROFEN (ADVIL,MOTRIN) 800 MG TABLET    Take 1 tablet (800 mg total) by mouth every 8 (eight) hours as needed for moderate pain (mild pain).   POLYETHYLENE GLYCOL POWDER (GLYCOLAX/MIRALAX) POWDER    Take 255 g by mouth once.  Modified Medications   No medications on file  Discontinued Medications   No medications on file    Review of Systems  Constitutional: Negative for fever, chills, diaphoresis, activity change, appetite change and fatigue.  HENT: Negative for ear pain and sore throat.   Eyes: Negative for visual disturbance.  Respiratory: Negative for cough, chest tightness, shortness of breath and wheezing.   Cardiovascular: Negative for chest pain, palpitations and leg swelling.    Gastrointestinal: Positive for abdominal pain. Negative for nausea, vomiting, diarrhea, constipation and blood in stool.  Genitourinary: Negative for dysuria, vaginal bleeding, vaginal discharge, difficulty urinating and pelvic pain.  Musculoskeletal: Positive for arthralgias and gait problem.  Neurological: Positive for numbness. Negative for dizziness, tremors and headaches.  Psychiatric/Behavioral: Negative for sleep disturbance. The patient is not nervous/anxious.     Filed Vitals:   11/12/14 1118  BP: 130/76  Pulse: 60  Temp: 97.9 F (36.6 C)  TempSrc: Oral  Resp: 18  Height: 5' 7"  (1.702 m)  Weight: 265 lb 6.4 oz (120.385 kg)  SpO2: 97%   Body mass index is 41.56 kg/(m^2).  Physical Exam  Constitutional: She appears well-developed and well-nourished. No distress.  Looks pale in NAD  HENT:  Mouth/Throat: Oropharynx is clear and moist. No oropharyngeal exudate.  Eyes: Pupils are equal, round, and reactive to light. No scleral icterus.  Neck: Neck supple. No tracheal deviation present.  Cardiovascular: Normal rate, regular rhythm, normal heart sounds and intact distal pulses.  Exam reveals no gallop and no friction rub.   No murmur heard. +1 pitting LE edema b/l. No calf TTP  Pulmonary/Chest: Effort normal and breath sounds normal. No stridor. No respiratory distress. She has no wheezes. She has no rales.  Abdominal: Soft. Bowel sounds are normal. She exhibits no distension and no mass. There is tenderness. There is no rebound and no guarding.  Lower transverse surgical incision healing well. No wound dehiscence. No redness or d/c.  Lymphadenopathy:    She has no cervical adenopathy.  Neurological: She is alert.  Skin: Skin is warm and dry. No rash noted.  Psychiatric: She has a normal mood and affect. Her behavior is normal. Thought content normal.     Labs reviewed: Admission on 11/03/2014, Discharged on 11/03/2014  Component Date Value Ref Range Status  . WBC  11/03/2014 8.6  4.0 - 10.5 K/uL Final  . RBC 11/03/2014 4.43  3.87 - 5.11 MIL/uL Final  . Hemoglobin 11/03/2014 12.1  12.0 - 15.0 g/dL Final  . HCT 11/03/2014 37.4  36.0 - 46.0 % Final  . MCV 11/03/2014 84.4  78.0 - 100.0 fL Final  . MCH 11/03/2014 27.3  26.0 - 34.0 pg Final  . MCHC 11/03/2014 32.4  30.0 - 36.0 g/dL Final  . RDW 11/03/2014 13.8  11.5 - 15.5 % Final  . Platelets 11/03/2014 280  150 - 400 K/uL Final  . Sodium 11/03/2014 140  135 - 145 mmol/L Final  . Potassium 11/03/2014 4.6  3.5 - 5.1 mmol/L Final  . Chloride 11/03/2014 105  101 - 111 mmol/L Final  . CO2 11/03/2014 27  22 - 32 mmol/L Final  . Glucose, Bld 11/03/2014 121* 65 - 99 mg/dL Final  . BUN 11/03/2014 14  6 - 20 mg/dL Final  . Creatinine, Ser 11/03/2014 0.77  0.44 - 1.00 mg/dL Final  . Calcium 11/03/2014 9.3  8.9 - 10.3 mg/dL Final  . Total Protein 11/03/2014 7.7  6.5 - 8.1 g/dL Final  . Albumin 11/03/2014 3.9  3.5 - 5.0 g/dL Final  . AST 11/03/2014 16  15 - 41 U/L Final  . ALT 11/03/2014 14  14 - 54 U/L Final  . Alkaline Phosphatase 11/03/2014 110  38 - 126 U/L Final  . Total Bilirubin 11/03/2014 0.9  0.3 - 1.2 mg/dL Final  . GFR calc non Af Amer 11/03/2014 >60  >60 mL/min Final  . GFR calc Af Amer 11/03/2014 >60  >60 mL/min Final   Comment: (NOTE) The eGFR has been calculated using the CKD EPI equation. This calculation has not been validated in all clinical situations. eGFR's persistently <60 mL/min signify possible Chronic Kidney Disease.   . Anion gap 11/03/2014 8  5 - 15 Final  . Color, Urine 11/03/2014 YELLOW  YELLOW Final  . APPearance 11/03/2014 CLEAR  CLEAR Final  . Specific Gravity, Urine 11/03/2014 1.020  1.005 - 1.030 Final  . pH 11/03/2014 7.0  5.0 - 8.0 Final  .  Glucose, UA 11/03/2014 NEGATIVE  NEGATIVE mg/dL Final  . Hgb urine dipstick 11/03/2014 TRACE* NEGATIVE Final  . Bilirubin Urine 11/03/2014 NEGATIVE  NEGATIVE Final  . Ketones, ur 11/03/2014 NEGATIVE  NEGATIVE mg/dL Final  .  Protein, ur 11/03/2014 NEGATIVE  NEGATIVE mg/dL Final  . Urobilinogen, UA 11/03/2014 2.0* 0.0 - 1.0 mg/dL Final  . Nitrite 11/03/2014 NEGATIVE  NEGATIVE Final  . Leukocytes, UA 11/03/2014 NEGATIVE  NEGATIVE Final  . RBC / HPF 11/03/2014 0-2  <3 RBC/hpf Final  . Bacteria, UA 11/03/2014 RARE  RARE Final  . Edwina Barth 11/03/2014 MUCOUS PRESENT   Final  . Specimen Description 11/03/2014 URINE, RANDOM   Final  . Special Requests 11/03/2014 NONE   Final  . Culture 11/03/2014    Final                   Value:MULTIPLE SPECIES PRESENT, SUGGEST RECOLLECTION Performed at [facility]   . Report Status 11/03/2014 11/05/2014 FINAL   Final  Admission on 10/26/2014, Discharged on 10/28/2014  Component Date Value Ref Range Status  . WBC 10/27/2014 9.3  4.0 - 10.5 K/uL Final  . RBC 10/27/2014 4.00  3.87 - 5.11 MIL/uL Final  . Hemoglobin 10/27/2014 11.0* 12.0 - 15.0 g/dL Final  . HCT 10/27/2014 33.8* 36.0 - 46.0 % Final  . MCV 10/27/2014 84.5  78.0 - 100.0 fL Final  . MCH 10/27/2014 27.5  26.0 - 34.0 pg Final  . MCHC 10/27/2014 32.5  30.0 - 36.0 g/dL Final  . RDW 10/27/2014 14.2  11.5 - 15.5 % Final  . Platelets 10/27/2014 192  150 - 400 K/uL Final  Hospital Outpatient Visit on 10/22/2014  Component Date Value Ref Range Status  . WBC 10/22/2014 6.3  4.0 - 10.5 K/uL Final  . RBC 10/22/2014 4.81  3.87 - 5.11 MIL/uL Final  . Hemoglobin 10/22/2014 13.5  12.0 - 15.0 g/dL Final  . HCT 10/22/2014 40.8  36.0 - 46.0 % Final  . MCV 10/22/2014 84.8  78.0 - 100.0 fL Final  . MCH 10/22/2014 28.1  26.0 - 34.0 pg Final  . MCHC 10/22/2014 33.1  30.0 - 36.0 g/dL Final  . RDW 10/22/2014 13.8  11.5 - 15.5 % Final  . Platelets 10/22/2014 256  150 - 400 K/uL Final  . Sodium 10/22/2014 137  135 - 145 mmol/L Final  . Potassium 10/22/2014 4.3  3.5 - 5.1 mmol/L Final  . Chloride 10/22/2014 108  101 - 111 mmol/L Final  . CO2 10/22/2014 25  22 - 32 mmol/L Final  . Glucose, Bld 10/22/2014 86  65 - 99 mg/dL Final    . BUN 10/22/2014 12  6 - 20 mg/dL Final  . Creatinine, Ser 10/22/2014 0.83  0.44 - 1.00 mg/dL Final  . Calcium 10/22/2014 9.0  8.9 - 10.3 mg/dL Final  . GFR calc non Af Amer 10/22/2014 >60  >60 mL/min Final  . GFR calc Af Amer 10/22/2014 >60  >60 mL/min Final   Comment: (NOTE) The eGFR has been calculated using the CKD EPI equation. This calculation has not been validated in all clinical situations. eGFR's persistently <60 mL/min signify possible Chronic Kidney Disease.   . Anion gap 10/22/2014 4* 5 - 15 Final  Office Visit on 10/01/2014  Component Date Value Ref Range Status  . Color, UA 10/01/2014 yellow   Final  . Clarity, UA 10/01/2014 clear   Final  . Glucose, UA 10/01/2014 negative   Final  . Bilirubin, UA 10/01/2014 negative  Final  . Ketones, UA 10/01/2014 negative   Final  . Spec Grav, UA 10/01/2014 >=1.030   Final  . Blood, UA 10/01/2014 negative   Final  . pH, UA 10/01/2014 5.0   Final  . Protein, UA 10/01/2014 trace   Final  . Urobilinogen, UA 10/01/2014 0.2   Final  . Nitrite, UA 10/01/2014 negative   Final  . Leukocytes, UA 10/01/2014 Negative  Negative Final  . Urine Culture, Routine 10/01/2014 Final report   Final  . Urine Culture result 1 10/01/2014 Comment   Final   Comment: Mixed urogenital flora Less than 10,000 colonies/mL   Appointment on 10/01/2014  Component Date Value Ref Range Status  . WBC 10/01/2014 4.5  3.9 - 10.3 10e3/uL Final  . NEUT# 10/01/2014 2.5  1.5 - 6.5 10e3/uL Final  . HGB 10/01/2014 13.0  11.6 - 15.9 g/dL Final  . HCT 10/01/2014 39.5  34.8 - 46.6 % Final  . Platelets 10/01/2014 244  145 - 400 10e3/uL Final  . MCV 10/01/2014 84.8  79.5 - 101.0 fL Final  . MCH 10/01/2014 27.9  25.1 - 34.0 pg Final  . MCHC 10/01/2014 32.9  31.5 - 36.0 g/dL Final  . RBC 10/01/2014 4.66  3.70 - 5.45 10e6/uL Final  . RDW 10/01/2014 13.9  11.2 - 14.5 % Final  . lymph# 10/01/2014 1.5  0.9 - 3.3 10e3/uL Final  . MONO# 10/01/2014 0.4  0.1 - 0.9 10e3/uL  Final  . Eosinophils Absolute 10/01/2014 0.2  0.0 - 0.5 10e3/uL Final  . Basophils Absolute 10/01/2014 0.0  0.0 - 0.1 10e3/uL Final  . NEUT% 10/01/2014 54.6  38.4 - 76.8 % Final  . LYMPH% 10/01/2014 33.0  14.0 - 49.7 % Final  . MONO% 10/01/2014 7.8  0.0 - 14.0 % Final  . EOS% 10/01/2014 4.2  0.0 - 7.0 % Final  . BASO% 10/01/2014 0.4  0.0 - 2.0 % Final  . Sodium 10/01/2014 141  136 - 145 mEq/L Final  . Potassium 10/01/2014 4.2  3.5 - 5.1 mEq/L Final  . Chloride 10/01/2014 109  98 - 109 mEq/L Final  . CO2 10/01/2014 26  22 - 29 mEq/L Final  . Glucose 10/01/2014 94  70 - 140 mg/dl Final  . BUN 10/01/2014 17.2  7.0 - 26.0 mg/dL Final  . Creatinine 10/01/2014 0.8  0.6 - 1.1 mg/dL Final  . Total Bilirubin 10/01/2014 0.76  0.20 - 1.20 mg/dL Final  . Alkaline Phosphatase 10/01/2014 117  40 - 150 U/L Final  . AST 10/01/2014 15  5 - 34 U/L Final  . ALT 10/01/2014 9  0 - 55 U/L Final  . Total Protein 10/01/2014 7.0  6.4 - 8.3 g/dL Final  . Albumin 10/01/2014 3.5  3.5 - 5.0 g/dL Final  . Calcium 10/01/2014 9.4  8.4 - 10.4 mg/dL Final  . Anion Gap 10/01/2014 7  3 - 11 mEq/L Final  . EGFR 10/01/2014 >90  >90 ml/min/1.73 m2 Final   eGFR is calculated using the CKD-EPI Creatinine Equation (2009)    No results found.   Assessment/Plan   ICD-9-CM ICD-10-CM   1. Asthma, unspecified asthma severity, uncomplicated - stable 496.75 J45.909   2. Allergic rhinitis due to pollen - stable 477.0 J30.1   3. Essential hypertension - stable 401.9 I10   4. History of breast cancer - on arimidex V10.3 Z85.3   5. S/P total hysterectomy and bilateral salpingo-oophorectomy V88.01 Z90.710    V45.77 Z90.79     Z90.722  due to uterine fibroids and ovarian fibroma   CANCEL SEPTEMBER 7TH APPT!  Continue other medications as ordered  No stairs until cleared by GYN. No lifting > gallon of milk until cleared by GYN  Follow up with GYN as scheduled  Follow up in 4 mos for routine visit  Goldman Birchall S. Perlie Gold  [facility] [address] [place], [place] [address] [phone] Cell (Monday-Friday 8 AM - 5 PM) [phone] After 5 PM and follow prompts

## 2014-11-30 DIAGNOSIS — Z09 Encounter for follow-up examination after completed treatment for conditions other than malignant neoplasm: Secondary | ICD-10-CM | POA: Diagnosis not present

## 2014-12-01 ENCOUNTER — Ambulatory Visit: Payer: Medicare Other | Admitting: Internal Medicine

## 2015-01-20 ENCOUNTER — Encounter: Payer: Self-pay | Admitting: Nurse Practitioner

## 2015-01-20 ENCOUNTER — Ambulatory Visit (INDEPENDENT_AMBULATORY_CARE_PROVIDER_SITE_OTHER): Payer: Medicare Other | Admitting: Nurse Practitioner

## 2015-01-20 VITALS — BP 118/70 | HR 79 | Temp 98.3°F | Resp 20 | Ht 67.0 in | Wt 259.0 lb

## 2015-01-20 DIAGNOSIS — E042 Nontoxic multinodular goiter: Secondary | ICD-10-CM | POA: Diagnosis not present

## 2015-01-20 DIAGNOSIS — R0789 Other chest pain: Secondary | ICD-10-CM | POA: Diagnosis not present

## 2015-01-20 NOTE — Patient Instructions (Signed)
Will get blood work today EKG normal   Chest Pain Observation It is often hard to give a specific diagnosis for the cause of chest pain. Among other possibilities your symptoms might be caused by inadequate oxygen delivery to your heart (angina). Angina that is not treated or evaluated can lead to a heart attack (myocardial infarction) or death. Blood tests, electrocardiograms, and X-rays may have been done to help determine a possible cause of your chest pain. After evaluation and observation, your health care provider has determined that it is unlikely your pain was caused by an unstable condition that requires hospitalization. However, a full evaluation of your pain may need to be completed, with additional diagnostic testing as directed. It is very important to keep your follow-up appointments. Not keeping your follow-up appointments could result in permanent heart damage, disability, or death. If there is any problem keeping your follow-up appointments, you must call your health care provider. HOME CARE INSTRUCTIONS  Due to the slight chance that your pain could be angina, it is important to follow your health care provider's treatment plan and also maintain a healthy lifestyle:  Maintain or work toward achieving a healthy weight.  Stay physically active and exercise regularly.  Decrease your salt intake.  Eat a balanced, healthy diet. Talk to a dietitian to learn about heart-healthy foods.  Increase your fiber intake by including whole grains, vegetables, fruits, and nuts in your diet.  Avoid situations that cause stress, anger, or depression.  Take medicines as advised by your health care provider. Report any side effects to your health care provider. Do not stop medicines or adjust the dosages on your own.  Quit smoking. Do not use nicotine patches or gum until you check with your health care provider.  Keep your blood pressure, blood sugar, and cholesterol levels within normal  limits.  Limit alcohol intake to no more than 1 drink per day for women who are not pregnant and 2 drinks per day for men.  Do not abuse drugs. SEEK IMMEDIATE MEDICAL CARE IF: You have severe chest pain or pressure which may include symptoms such as:  You feel pain or pressure in your arms, neck, jaw, or back.  You have severe back or abdominal pain, feel sick to your stomach (nauseous), or throw up (vomit).  You are sweating profusely.  You are having a fast or irregular heartbeat.  You feel short of breath while at rest.  You notice increasing shortness of breath during rest, sleep, or with activity.  You have chest pain that does not get better after rest or after taking your usual medicine.  You wake from sleep with chest pain.  You are unable to sleep because you cannot breathe.  You develop a frequent cough or you are coughing up blood.  You feel dizzy, faint, or experience extreme fatigue.  You develop severe weakness, dizziness, fainting, or chills. Any of these symptoms may represent a serious problem that is an emergency. Do not wait to see if the symptoms will go away. Call your local emergency services (911 in the [place] Do not drive yourself to the hospital. MAKE SURE YOU:  Understand these instructions.  Will watch your condition.  Will get help right away if you are not doing well or get worse.   This information is not intended to replace advice given to you by your health care provider. Make sure you discuss any questions you have with your health care provider.   Document Released: 04/14/2010  Document Revised: 03/17/2013 Document Reviewed: 09/11/2012 Elsevier Interactive Patient Education [employer].

## 2015-01-20 NOTE — Progress Notes (Signed)
Patient ID: Michaela Little, female   DOB: [birth year], 54 y.o.   MRN: [id number]    PCP: Michaela Cranker, DO  Advanced Directive information Does patient have an advance directive?: No, Would patient like information on creating an advanced directive?: Yes - Educational materials given  Allergies  Allergen Reactions  . Adhesive [Tape] Itching and Other (See Comments)    Michaela Little  . Calcium Carbonate Antacid Hives    Michaela Little   . Hctz [Hydrochlorothiazide] Itching  . Latex Itching  . Lisinopril Itching  . Losartan Potassium Other (See Comments)    Makes her feel "bad"    Chief Complaint  Patient presents with  . Acute Visit    cysts on neck and shoulder,thyroid. pain in upper shoulder,back left arm.     HPI: Patient is a 54 y.o. female seen in the office today noted with chest pain. Started a couple of days ago. Mid-chest that goes into back and to right shoulder and now right arm. Worse pain when she bend over. Reports pressure feeling, does not feel like gas. Notes shortness of breath with activity but this is baseline. Has not done any exercise or increase in activity. No diaphoresis. No association with food. Reports hx of hiatal hernia and H pylori 3 years ago. (Reviewed abdominal imaging and do not find hiatal hernia noted) Hx of lap chole in 2014, had pain to back and hard knot in area that has continually  persisted. Denies anxiety. No cough or congestion.  Here today also because she is concerned over cyst found on [place] of neck back in July. Hx of breast cancer.   Review of Systems:  Review of Systems  Constitutional: Negative for fever, chills, diaphoresis, activity change, appetite change and fatigue.  HENT: Negative for ear pain and sore throat.   Eyes: Negative for visual disturbance.  Respiratory: Negative for cough, chest tightness, shortness of breath and wheezing.   Cardiovascular: Positive for chest pain. Negative for palpitations and leg swelling.    Gastrointestinal: Negative for nausea, vomiting, abdominal pain, diarrhea, constipation and blood in stool.  Genitourinary: Negative for vaginal bleeding, vaginal discharge and pelvic pain.  Musculoskeletal: Positive for arthralgias.  Neurological: Negative for dizziness, tremors and headaches.  Psychiatric/Behavioral: Negative for sleep disturbance. The patient is not nervous/anxious.     Past Medical History  Diagnosis Date  . Uterine fibroid   . Hyperlipidemia   . Hypertension   . Asthma   . Seasonal allergies   . Anxiety   . Headache(784.0)   . History of breast cancer     2010--  LEFT  s/p  mastectomy (in [place]) AND CHEMORADIATION--  NO RECURRENCE  . GERD (gastroesophageal reflux disease)   . Cervical stenosis (uterine cervix)   . Arthritis     hands, knees, hips  . Positive H. pylori test     08-05-2013  . Pelvic pain in female   . History of cervical dysplasia   . Refusal of blood transfusions as patient is Jehovah's Witness   . Dyspnea on exertion   . Wears glasses   . History of TB skin testing     [place] TEEN--  [place] W/ MEDS  . OSA (obstructive sleep apnea) moderate osa per study  09/2010    CPAP  , NOT USING ON REGULAR BASIS   Past Surgical History  Procedure Laterality Date  . Eye surgery  1961    right  . Cervical conization w/bx  2012   in  [place]  . Colonoscopy  2011    normal per patient - [place]  . Cholecystectomy N/A 05/14/2012    Procedure: LAPAROSCOPIC CHOLECYSTECTOMY;  Surgeon: Michaela Ok, MD;  Location: [facility];  Service: General;  Laterality: N/A;  . Dilation and curettage of uterus  10/15/2011    Procedure: DILATATION AND CURETTAGE;  Surgeon: Michaela Schools, MD;  Location: [facility];  Service: Gynecology;  Laterality: N/A;  Conization &  endocervical curettings  . Cardiovascular stress test  10-29-2012    low risk perfusion study/  no significant reversibity/ ef 66%/  normal wall motion  . Transthoracic echocardiogram  10-29-2012    mild lvh/  ef 60-65%/  grade  II diastolic dysfunction/  trivial mr  &  tr  . Mastectomy Left 11/2008  in [place]   . Examination under anesthesia N/A 08/20/2013    Procedure: EXAM UNDER ANESTHESIA;  Surgeon: Michaela Asal, MD;  Location: [facility];  Service: Gynecology;  Laterality: N/A;  . Laparoscopic assisted vaginal hysterectomy N/A 10/26/2014    Procedure: HYSTERECTOMY ABDOMINAL ;  Surgeon: Michaela Posey, MD;  Location: [facility];  Service: Gynecology;  Laterality: N/A;  . Salpingoophorectomy Bilateral 10/26/2014    Procedure: BILATERAL SALPINGO OOPHORECTOMY;  Surgeon: Michaela Posey, MD;  Location: [facility];  Service: Gynecology;  Laterality: Bilateral;   Social History:   reports that she quit smoking about 26 years ago. Her smoking use included Cigarettes. She has a 3 pack-year smoking history. She has never used smokeless tobacco. She reports that she does not drink alcohol or use illicit drugs.  Family History  Problem Relation Age of Onset  . Breast cancer Mother   . Colon cancer Mother   . Hypotension Mother   . Asthma Mother   . Diabetes type II Mother   . Arthritis Mother   . Clotting disorder Mother   . Cancer Mother     breast/colon  . Mental illness Brother   . Heart disease Brother   . Cerebral palsy Daughter   . Emphysema Brother     never smoker  . Colon cancer Maternal Aunt   . Cancer Maternal Aunt     colon  . Colon cancer Maternal Uncle   . Cancer Maternal Uncle     colon  . Esophageal cancer Neg Hx   . Rectal cancer Neg Hx   . Stomach cancer Neg Hx   . Thyroid disease Neg Hx     Medications: Patient's Medications  New Prescriptions   No medications on file  Previous Medications   ALBUTEROL (VENTOLIN HFA) 108 (90 BASE) MCG/ACT INHALER    Inhale 2 puffs into the lungs every 4 (four) hours as needed for wheezing or shortness of breath.    AMLODIPINE (NORVASC) 10 MG TABLET    Take 1 tablet (10 mg total) by mouth every morning.   ANASTROZOLE  (ARIMIDEX) 1 MG TABLET    Take 1 mg by mouth daily.   DOCUSATE SODIUM (COLACE) 100 MG CAPSULE    Take 1 capsule (100 mg total) by mouth every 12 (twelve) hours.   HYDROCODONE-IBUPROFEN (VICOPROFEN) 7.5-200 MG PER TABLET    Take 1 tablet by mouth every 8 (eight) hours as needed for moderate pain.   HYDROCORTISONE (ANUSOL-HC) 2.5 % RECTAL CREAM    Apply rectally 2 times daily   IBUPROFEN (ADVIL,MOTRIN) 800 MG TABLET    Take 1 tablet (800 mg total) by mouth every 8 (eight) hours  as needed for moderate pain (mild pain).   POLYETHYLENE GLYCOL POWDER (GLYCOLAX/MIRALAX) POWDER    Take 255 g by mouth once.  Modified Medications   No medications on file  Discontinued Medications   No medications on file     Physical Exam:  Filed Vitals:   01/20/15 1403  BP: 118/70  Pulse: 79  Temp: 98.3 F (36.8 C)  TempSrc: Oral  Resp: 20  Height: 5\' 7"  (1.702 m)  Weight: 259 lb (117.482 kg)  SpO2: 98%   Body mass index is 40.56 kg/(m^2).  Physical Exam  Constitutional: She is oriented to person, place, and time. She appears well-developed and well-nourished. No distress.  HENT:  Head: Normocephalic and atraumatic.  Mouth/Throat: Oropharynx is clear and moist. No oropharyngeal exudate.  Eyes: Conjunctivae are normal. Pupils are equal, round, and reactive to light.  Neck: Normal range of motion. Neck supple. No JVD present. No tracheal deviation present. No thyromegaly present.  Cardiovascular: Normal rate, regular rhythm and normal heart sounds.   EKG done and in NSR  Pulmonary/Chest: Effort normal and breath sounds normal.  Abdominal: Soft. Bowel sounds are normal.  Musculoskeletal: She exhibits no edema or tenderness.  Lymphadenopathy:    She has no cervical adenopathy.  Neurological: She is alert and oriented to person, place, and time.  Skin: Skin is warm and dry. She is not diaphoretic.  Psychiatric:  Flat affect    Labs reviewed: Basic Metabolic Panel:  Recent Labs  06/16/14 0915  07/06/14 0922 10/01/14 1049 10/22/14 1510 11/03/14 1900  NA 140  --  141 137 140  K 4.3  --  4.2 4.3 4.6  CL 101  --   --  108 105  CO2 23  --  26 25 27   GLUCOSE 88  --  94 86 121*  BUN 14  --  17.2 12 14   CREATININE 0.74  --  0.8 0.83 0.77  CALCIUM 9.5  --  9.4 9.0 9.3  TSH 2.860 1.680  --   --   --    Liver Function Tests:  Recent Labs  06/16/14 0915 10/01/14 1049 11/03/14 1900  AST 7 15 16   ALT 7 9 14   ALKPHOS 112 117 110  BILITOT 0.6 0.76 0.9  PROT 7.1 7.0 7.7  ALBUMIN 4.1 3.5 3.9   No results for input(s): LIPASE, AMYLASE in the last 8760 hours. No results for input(s): AMMONIA in the last 8760 hours. CBC:  Recent Labs  06/16/14 0915 10/01/14 1049 10/22/14 1510 10/27/14 0535 11/03/14 1900  WBC 8.8 4.5 6.3 9.3 8.6  NEUTROABS 5.7 2.5  --   --   --   HGB 13.8 13.0 13.5 11.0* 12.1  HCT 41.4 39.5 40.8 33.8* 37.4  MCV 82 84.8 84.8 84.5 84.4  PLT 280 244 256 192 280   Lipid Panel: No results for input(s): CHOL, HDL, LDLCALC, TRIG, CHOLHDL, LDLDIRECT in the last 8760 hours. TSH:  Recent Labs  06/16/14 0915 07/06/14 0922  TSH 2.860 1.680   A1C: No results found for: HGBA1C  FINDINGS: Right thyroid lobe  Measurements: 6.2 cm x 2.1 cm x 2.4 cm. Multiple right-sided thyroid nodules.  Dominant nodule is inferior: 1.3 cm x 7 mm x 1.3 cm with solid soft tissue features.  Similar appearance of superior right thyroid nodule with cystic characteristics and internal reflectors: 10 mm x 8 mm x 7 mm  Similar appearance of cystic lesion with internal reflectors more inferiorly: 9 mm x 5 mm x 9 mm  Similar appearance of cystic lesion with internal flexures more inferiorly: 6 mm x 4 mm x 5 mm  Left thyroid lobe  Measurements: 4.5 cm x 1.4 cm x 1.6 cm. No nodules visualized.  Isthmus  Thickness: 6 mm. No nodules visualized.  Lymphadenopathy  Lymph nodes are not enlarged by head and neck criteria.  IMPRESSION: Multinodular thyroid.  Majority of right-sided lesions have characteristics compatible with colloid cyst.  Dominant right-sided nodule has enlarged in the interval now measuring 1.3 cm, though does not yet meet criteria for biopsy.  Follow-up by clinical exam is recommended. If patient has known risk factors for thyroid carcinoma, consider follow-up ultrasound in 12 months. If patient is clinically hyperthyroid, consider nuclear medicine thyroid uptake and scan.Reference: Management of Thyroid Nodules Detected at [place]: Society of Radiologists in [place]. Radiology 2005; [id number].  Assessment/Plan 1. Other chest pain  ekg obtained and pt in NSR, normal EKG  - will obtain Troponin I at this time -does not appear to be related to GERD or pulmonary cause however pt does have hx of both.  -discussed if she had chest pain that reoccurred not to hesitate to go to the ED for immediate evaluation. Warning signs discussed and pt verbalized understanding and agreed  2. Multinodular thyroid Discussed US done in July 2016 with pt, discussed recommended follow up for 1 year Will follow up TSH  30  Time TOTAL:  time greater than 50% of total time spent doing pt counseling and reviewing records with pt.   Janett Billow K. Harle Battiest  [facility] [phone] 8 am - 5 pm) [phone] (after hours)

## 2015-01-21 LAB — TROPONIN I: Troponin I: <0.01 ng/mL (ref 0.00–0.04)

## 2015-01-21 LAB — TSH: TSH: 0.992 u[IU]/mL (ref 0.450–4.500)

## 2015-01-24 DIAGNOSIS — Z09 Encounter for follow-up examination after completed treatment for conditions other than malignant neoplasm: Secondary | ICD-10-CM | POA: Diagnosis not present

## 2015-02-16 ENCOUNTER — Ambulatory Visit: Payer: Medicare Other | Admitting: Internal Medicine

## 2015-03-31 ENCOUNTER — Ambulatory Visit: Payer: Medicare Other | Admitting: Oncology

## 2015-03-31 ENCOUNTER — Other Ambulatory Visit: Payer: Medicare Other

## 2015-04-06 ENCOUNTER — Other Ambulatory Visit: Payer: Self-pay | Admitting: *Deleted

## 2015-04-06 DIAGNOSIS — C50912 Malignant neoplasm of unspecified site of left female breast: Secondary | ICD-10-CM

## 2015-04-07 ENCOUNTER — Other Ambulatory Visit (HOSPITAL_BASED_OUTPATIENT_CLINIC_OR_DEPARTMENT_OTHER): Payer: Medicare Other

## 2015-04-07 ENCOUNTER — Ambulatory Visit (HOSPITAL_BASED_OUTPATIENT_CLINIC_OR_DEPARTMENT_OTHER): Payer: Medicare Other | Admitting: Oncology

## 2015-04-07 ENCOUNTER — Telehealth: Payer: Self-pay | Admitting: Oncology

## 2015-04-07 VITALS — BP 143/71 | HR 80 | Temp 98.2°F | Resp 19 | Ht 67.0 in | Wt 265.1 lb

## 2015-04-07 DIAGNOSIS — Z853 Personal history of malignant neoplasm of breast: Secondary | ICD-10-CM

## 2015-04-07 DIAGNOSIS — R599 Enlarged lymph nodes, unspecified: Secondary | ICD-10-CM

## 2015-04-07 DIAGNOSIS — C50112 Malignant neoplasm of central portion of left female breast: Secondary | ICD-10-CM

## 2015-04-07 DIAGNOSIS — C50912 Malignant neoplasm of unspecified site of left female breast: Secondary | ICD-10-CM

## 2015-04-07 DIAGNOSIS — C50812 Malignant neoplasm of overlapping sites of left female breast: Secondary | ICD-10-CM

## 2015-04-07 LAB — CBC WITH DIFFERENTIAL/PLATELET
BASO%: 0.7 % (ref 0.0–2.0)
Basophils Absolute: 0 10*3/uL (ref 0.0–0.1)
EOS%: 3.4 % (ref 0.0–7.0)
Eosinophils Absolute: 0.2 10*3/uL (ref 0.0–0.5)
HEMATOCRIT: 39.8 % (ref 34.8–46.6)
HGB: 12.8 g/dL (ref 11.6–15.9)
LYMPH#: 1.5 10*3/uL (ref 0.9–3.3)
LYMPH%: 29.6 % (ref 14.0–49.7)
MCH: 26.4 pg (ref 25.1–34.0)
MCHC: 32.2 g/dL (ref 31.5–36.0)
MCV: 82.1 fL (ref 79.5–101.0)
MONO#: 0.3 10*3/uL (ref 0.1–0.9)
MONO%: 6.4 % (ref 0.0–14.0)
NEUT%: 59.9 % (ref 38.4–76.8)
NEUTROS ABS: 3 10*3/uL (ref 1.5–6.5)
PLATELETS: 200 10*3/uL (ref 145–400)
RBC: 4.84 10*6/uL (ref 3.70–5.45)
RDW: 15 % — ABNORMAL HIGH (ref 11.2–14.5)
WBC: 5 10*3/uL (ref 3.9–10.3)

## 2015-04-07 LAB — COMPREHENSIVE METABOLIC PANEL
ALBUMIN: 3.6 g/dL (ref 3.5–5.0)
ALK PHOS: 137 U/L (ref 40–150)
AST: 16 U/L (ref 5–34)
Anion Gap: 9 mEq/L (ref 3–11)
BILIRUBIN TOTAL: 0.71 mg/dL (ref 0.20–1.20)
BUN: 10.8 mg/dL (ref 7.0–26.0)
CALCIUM: 9.5 mg/dL (ref 8.4–10.4)
CO2: 23 mEq/L (ref 22–29)
CREATININE: 0.8 mg/dL (ref 0.6–1.1)
Chloride: 109 mEq/L (ref 98–109)
EGFR: 89 mL/min/{1.73_m2} — ABNORMAL LOW (ref >90)
Glucose: 109 mg/dl (ref 70–140)
Potassium: 4.2 mEq/L (ref 3.5–5.1)
Sodium: 141 mEq/L (ref 136–145)
TOTAL PROTEIN: 7.3 g/dL (ref 6.4–8.3)

## 2015-04-07 NOTE — Telephone Encounter (Signed)
Appointments made and avs printed for pateint °

## 2015-04-07 NOTE — Progress Notes (Signed)
ID: [name]   DOB: 1960/06/07  MR#: [id number]  GMW#:[id number]  PCP: Gildardo Cranker, DO SU:  Ralene Ok GYN: Harrell Gave, MD OTHER MD: Crissie Reese, Silvano Rusk, Christinia Gully  CHIEF COMPLAINT: breast cancer, neck pain CURRENT TREATMENT:  observation  BREAST CANCER HISTORY:  Michaela Little is a [place] woman who moved to [facility] in 2013 with a history of breast cancer, and establishied herself with our practice.  The patient had left breast and left axillary lymph node biopsy performed March of 2010, both positive (I do not have the pathology report at this point). She was treated [facility]adjuvantly at [facility] with (a) paclitaxel weekly x12 and (b) doxorubicin/cyclophosphamide in dose dense fashion x4, both given with tifiparnib.   She then proceeded to left modified radical mastectomy 12/23/2008, the final pathology showing a 4 cm residual invasive lobular carcinoma, involving 5/22 lymph nodes sampled. The tumor was estrogen and progesterone receptor positive, HER-2 negative.   Postoperatively she received 50 gray of radiation completed January of 2011, including the left supraclavicular lymph node basin. She was then started on tamoxifen, but developed some uterine lining thickening and was switched to anastrozole in May 2012. Her subsequent history is as detailed below  INTERVAL HISTORY: Michaela Little returns today for follow-up of her estrogen receptor positive breast cancer. She was supposed to be on anastrozole through the latter part of this year , but she took herself off some time summer of 2016. She really cannot tell me why she stopped the pill. It is not expensive, was not causing her any particular side effects that she is aware of, and she didn't feel any different or certainly any better after stopping it. "I just don't like to take pills".  REVIEW OF SYSTEMS: Michaela Little is very worried because she has some "bumps " on her neck that she wanted me to examine. She  also of course as a goiter and that is the source of concern to her as well. Finally there is some discomfort in the inferior aspect of the right breast that she also wanted me to look at. She denies unusual headaches, visual changes, nausea, or vomiting. She has some aches and pains here and there but there are not more persistent or intense than prior. A detailed review of systems today was otherwise stable.  PAST MEDICAL HISTORY: Past Medical History  Diagnosis Date  . Uterine fibroid   . Hyperlipidemia   . Hypertension   . Asthma   . Seasonal allergies   . Anxiety   . Headache(784.0)   . History of breast cancer     2010--  LEFT  s/p  mastectomy (in [place]) AND CHEMORADIATION--  NO RECURRENCE  . GERD (gastroesophageal reflux disease)   . Cervical stenosis (uterine cervix)   . Arthritis     hands, knees, hips  . Positive H. pylori test     08-05-2013  . Pelvic pain in female   . History of cervical dysplasia   . Refusal of blood transfusions as patient is Jehovah's Witness   . Dyspnea on exertion   . Wears glasses   . History of TB skin testing     [place] TEEN--  [place] W/ MEDS  . OSA (obstructive sleep apnea) moderate osa per study  09/2010    CPAP  , NOT USING ON REGULAR BASIS    PAST SURGICAL HISTORY: Past Surgical History  Procedure Laterality Date  . Eye surgery  1961    right  . Cervical conization  w/bx  2012   in  [place]  . Colonoscopy  2011    normal per patient - [place]  . Cholecystectomy N/A 05/14/2012    Procedure: LAPAROSCOPIC CHOLECYSTECTOMY;  Surgeon: Ralene Ok, MD;  Location: [facility];  Service: General;  Laterality: N/A;  . Dilation and curettage of uterus  10/15/2011    Procedure: DILATATION AND CURETTAGE;  Surgeon: Melina Schools, MD;  Location: [facility];  Service: Gynecology;  Laterality: N/A;  Conization &  endocervical curettings  . Cardiovascular stress test  10-29-2012    low risk perfusion study/  no significant reversibity/ ef 66%/  normal wall motion  .  Transthoracic echocardiogram  10-29-2012    mild lvh/  ef 60-65%/  grade II diastolic dysfunction/  trivial mr  &  tr  . Mastectomy Left 11/2008  in [place]   . Examination under anesthesia N/A 08/20/2013    Procedure: EXAM UNDER ANESTHESIA;  Surgeon: Margarette Asal, MD;  Location: [facility];  Service: Gynecology;  Laterality: N/A;  . Laparoscopic assisted vaginal hysterectomy N/A 10/26/2014    Procedure: HYSTERECTOMY ABDOMINAL ;  Surgeon: Molli Posey, MD;  Location: [facility];  Service: Gynecology;  Laterality: N/A;  . Salpingoophorectomy Bilateral 10/26/2014    Procedure: BILATERAL SALPINGO OOPHORECTOMY;  Surgeon: Molli Posey, MD;  Location: [facility];  Service: Gynecology;  Laterality: Bilateral;    FAMILY HISTORY Family History  Problem Relation Age of Onset  . Breast cancer Mother   . Colon cancer Mother   . Hypotension Mother   . Asthma Mother   . Diabetes type II Mother   . Arthritis Mother   . Clotting disorder Mother   . Cancer Mother     breast/colon  . Mental illness Brother   . Heart disease Brother   . Cerebral palsy Daughter   . Emphysema Brother     never smoker  . Colon cancer Maternal Aunt   . Cancer Maternal Aunt     colon  . Colon cancer Maternal Uncle   . Cancer Maternal Uncle     colon  . Esophageal cancer Neg Hx   . Rectal cancer Neg Hx   . Stomach cancer Neg Hx   . Thyroid disease Neg Hx    Gynecologic history: She is GX P5. At first pregnancy to term age 1. Menarche age 25. Last menstrual period when she had her chemotherapy.   Social History: She is disabled secondary to arthritis. She moved from [place] to [place] for "peace of mind". Of her 5 children only one daughter is living in [place], [place]. One daughter in [place] is incarcerated and another one is "in a lot of stress". The patient lives alone. She mostly reads and does computer games on her phone during the day. She has 20 grandchildren.     ADVANCED DIRECTIVES: She does not have a healthcare power of attorney.    HEALTH MAINTENANCE: Social History  Substance Use Topics  . Smoking status: Former Smoker -- 0.30 packs/day for 10 years    Types: Cigarettes    Quit date: 03/26/1988  . Smokeless tobacco: Never Used  . Alcohol Use: No     Allergies  Allergen Reactions  . Adhesive [Tape] Itching and Other (See Comments)    REDDNESS  . Calcium Carbonate Antacid Hives    Fruit flavored Tums   . Hctz [Hydrochlorothiazide] Itching  . Latex Itching  . Lisinopril Itching  . Losartan  Potassium Other (See Comments)    Makes her feel "bad"    Current Outpatient Prescriptions  Medication Sig Dispense Refill  . albuterol (VENTOLIN HFA) 108 (90 BASE) MCG/ACT inhaler Inhale 2 puffs into the lungs every 4 (four) hours as needed for wheezing or shortness of breath.     Marland Kitchen amLODipine (NORVASC) 10 MG tablet Take 1 tablet (10 mg total) by mouth every morning. 90 tablet 1  . anastrozole (ARIMIDEX) 1 MG tablet Take 1 mg by mouth daily.    Marland Kitchen docusate sodium (COLACE) 100 MG capsule Take 1 capsule (100 mg total) by mouth every 12 (twelve) hours. 60 capsule 0  . HYDROcodone-ibuprofen (VICOPROFEN) 7.5-200 MG per tablet Take 1 tablet by mouth every 8 (eight) hours as needed for moderate pain. 30 tablet 0  . hydrocortisone (ANUSOL-HC) 2.5 % rectal cream Apply rectally 2 times daily (Patient not taking: Reported on 01/20/2015) 30 g 1  . ibuprofen (ADVIL,MOTRIN) 800 MG tablet Take 1 tablet (800 mg total) by mouth every 8 (eight) hours as needed for moderate pain (mild pain). 30 tablet 1  . polyethylene glycol powder (GLYCOLAX/MIRALAX) powder Take 255 g by mouth once. 255 g 0   No current facility-administered medications for this visit.    OBJECTIVE: Middle-aged [place] American woman  in no acute distress Filed Vitals:   04/07/15 1102  BP: 143/71  Pulse: 80  Temp: 98.2 F (36.8 C)  Resp: 19     Body mass index is 41.51 kg/(m^2).     ECOG FS: 1 Filed Weights   04/07/15 1102  Weight: 265 lb 1.6 oz (120.249 kg)   Sclerae unicteric, EOMs intact Oropharynx clear,  No thrush or other lesions  there is a 1 cm left supraclavicular lymph node very medial in the supraclavicular fossa , which is hard and fixed. It is not erythematous or tender. She tells me she can feel a similar lymph node on the right side but I cannot reproduce that.athy Lungs no rales or rhonchi Heart regular rate and rhythm Abd soft, obese, nontender, positive bowel sounds MSK no focal spinal tenderness, no upper extremity lymphedema Neuro: nonfocal, well oriented, appropriate affect Breasts:  The area in the  Right inframammary fold is slightly irregular but there is no suspicious mass there or in the rest of the breast. The left breast is status post mastectomy and reconstruction. I do not see any evidence of chest wall or left axillary recurrence.   LAB RESULTS: Lab Results  Component Value Date   WBC 5.0 04/07/2015   NEUTROABS 3.0 04/07/2015   HGB 12.8 04/07/2015   HCT 39.8 04/07/2015   MCV 82.1 04/07/2015   PLT 200 04/07/2015      Chemistry      Component Value Date/Time   NA 140 11/03/2014 1900   NA 141 10/01/2014 1049   NA 140 06/16/2014 0915   K 4.6 11/03/2014 1900   K 4.2 10/01/2014 1049   CL 105 11/03/2014 1900   CL 105 07/07/2012 1327   CO2 27 11/03/2014 1900   CO2 26 10/01/2014 1049   BUN 14 11/03/2014 1900   BUN 17.2 10/01/2014 1049   BUN 14 06/16/2014 0915   CREATININE 0.77 11/03/2014 1900   CREATININE 0.8 10/01/2014 1049      Component Value Date/Time   CALCIUM 9.3 11/03/2014 1900   CALCIUM 9.4 10/01/2014 1049   ALKPHOS 110 11/03/2014 1900   ALKPHOS 117 10/01/2014 1049   AST 16 11/03/2014 1900   AST 15 10/01/2014  1049   ALT 14 11/03/2014 1900   ALT 9 10/01/2014 1049   BILITOT 0.9 11/03/2014 1900   BILITOT 0.76 10/01/2014 1049   BILITOT 0.6 06/16/2014 0915       Lab Results  Component Value Date   LABCA2 19  01/17/2012     STUDIES: CLINICAL DATA: 55 year old female  EXAM: THYROID ULTRASOUND  TECHNIQUE: Ultrasound examination of the thyroid gland and adjacent soft tissues was performed.  COMPARISON: 06/29/2014  FINDINGS: Right thyroid lobe  Measurements: 6.2 cm x 2.1 cm x 2.4 cm. Multiple right-sided thyroid nodules.  Dominant nodule is inferior: 1.3 cm x 7 mm x 1.3 cm with solid soft tissue features.  Similar appearance of superior right thyroid nodule with cystic characteristics and internal reflectors: 10 mm x 8 mm x 7 mm  Similar appearance of cystic lesion with internal reflectors more inferiorly: 9 mm x 5 mm x 9 mm  Similar appearance of cystic lesion with internal flexures more inferiorly: 6 mm x 4 mm x 5 mm  Left thyroid lobe  Measurements: 4.5 cm x 1.4 cm x 1.6 cm. No nodules visualized.  Isthmus  Thickness: 6 mm. No nodules visualized.  Lymphadenopathy  Lymph nodes are not enlarged by head and neck criteria.  IMPRESSION: Multinodular thyroid. Majority of right-sided lesions have characteristics compatible with colloid cyst.  Dominant right-sided nodule has enlarged in the interval now measuring 1.3 cm, though does not yet meet criteria for biopsy.  Follow-up by clinical exam is recommended. If patient has known risk factors for thyroid carcinoma, consider follow-up ultrasound in 12 months. If patient is clinically hyperthyroid, consider nuclear medicine thyroid uptake and scan.Reference: Management of Thyroid Nodules Detected at [place]: Society of Radiologists in [place]. Radiology 2005; [id number].  Signed,  Dulcy Fanny. Earleen Newport, DO  55 y.o.  [place] woman relocated here from [place],    (1)  left-sided breast cancer diagnosed March of 2010 and treated [facility]-adjuvantly at [facility] with (a) paclitaxel weekly x12 and (b) doxorubicin/ cyclophosphamide in dose dense fashion x4, both given with  tifiparnib.   (2) definitive left modified radical mastectomy September of 2010 for a T2 N2 or stage IIIA invasive lobular breast cancer, estrogen and progesterone receptor positive, HER-2 negative,   (3)  post mastectomy radiation completed January of 2011,  (4) briefly on tamoxifen, discontinued it because of uterine lining concerns.  On anastrozole since May 2012 with good tolerance. Held between November and December 2013 due to GI issues, unrelated to cancer or its treatment.  Discontinued by the patient summer of 2016  (5)  Other problems include left upper extremity lymphedema, borderline concern regarding genetic family history, history of cervical dysplasia, and history of dense breasts.  PLAN: Michaela Little is now 6 years out from her definitive surgery and we would be ready to release her from follow-up except for the fact that she does have a left supraclavicular lymph node which is of concern. I'm going to restage her with a CT of the neck and chest and I will see her later this month to discuss those results.  She did not quite make it to 5 years on anti-estrogens but at this point she is not motivated to receive any further antiestrogen treatment. August lead we have recurrent disease we will have to readdress  Assuming everything is completely negative I will refer her to our survivorship program. She knows to call for any problems that may develop before her next visit here. MAGRINAT,GUSTAV C  04/07/2015

## 2015-04-17 ENCOUNTER — Emergency Department (HOSPITAL_COMMUNITY): Payer: Medicare Other

## 2015-04-17 ENCOUNTER — Emergency Department (HOSPITAL_COMMUNITY)
Admission: EM | Admit: 2015-04-17 | Discharge: 2015-04-17 | Disposition: A | Discharge status: Home / Self Care | Payer: Medicare Other | Attending: Emergency Medicine | Admitting: Emergency Medicine

## 2015-04-17 ENCOUNTER — Encounter (HOSPITAL_COMMUNITY): Payer: Self-pay | Admitting: Emergency Medicine

## 2015-04-17 DIAGNOSIS — R609 Edema, unspecified: Secondary | ICD-10-CM | POA: Insufficient documentation

## 2015-04-17 DIAGNOSIS — J45909 Unspecified asthma, uncomplicated: Secondary | ICD-10-CM | POA: Diagnosis not present

## 2015-04-17 DIAGNOSIS — Z79899 Other long term (current) drug therapy: Secondary | ICD-10-CM | POA: Diagnosis not present

## 2015-04-17 DIAGNOSIS — Z9981 Dependence on supplemental oxygen: Secondary | ICD-10-CM | POA: Insufficient documentation

## 2015-04-17 DIAGNOSIS — R1031 Right lower quadrant pain: Secondary | ICD-10-CM | POA: Insufficient documentation

## 2015-04-17 DIAGNOSIS — Z973 Presence of spectacles and contact lenses: Secondary | ICD-10-CM | POA: Insufficient documentation

## 2015-04-17 DIAGNOSIS — R109 Unspecified abdominal pain: Secondary | ICD-10-CM | POA: Diagnosis not present

## 2015-04-17 DIAGNOSIS — Z8619 Personal history of other infectious and parasitic diseases: Secondary | ICD-10-CM | POA: Insufficient documentation

## 2015-04-17 DIAGNOSIS — Z853 Personal history of malignant neoplasm of breast: Secondary | ICD-10-CM | POA: Diagnosis not present

## 2015-04-17 DIAGNOSIS — I10 Essential (primary) hypertension: Secondary | ICD-10-CM | POA: Diagnosis not present

## 2015-04-17 DIAGNOSIS — M25551 Pain in right hip: Secondary | ICD-10-CM | POA: Insufficient documentation

## 2015-04-17 DIAGNOSIS — Z86018 Personal history of other benign neoplasm: Secondary | ICD-10-CM | POA: Diagnosis not present

## 2015-04-17 DIAGNOSIS — G4733 Obstructive sleep apnea (adult) (pediatric): Secondary | ICD-10-CM | POA: Insufficient documentation

## 2015-04-17 DIAGNOSIS — M1990 Unspecified osteoarthritis, unspecified site: Secondary | ICD-10-CM | POA: Diagnosis not present

## 2015-04-17 DIAGNOSIS — R0602 Shortness of breath: Secondary | ICD-10-CM | POA: Diagnosis not present

## 2015-04-17 DIAGNOSIS — Z91040 Latex allergy status: Secondary | ICD-10-CM | POA: Insufficient documentation

## 2015-04-17 DIAGNOSIS — Z9049 Acquired absence of other specified parts of digestive tract: Secondary | ICD-10-CM | POA: Insufficient documentation

## 2015-04-17 DIAGNOSIS — Z87891 Personal history of nicotine dependence: Secondary | ICD-10-CM | POA: Diagnosis not present

## 2015-04-17 DIAGNOSIS — Z8659 Personal history of other mental and behavioral disorders: Secondary | ICD-10-CM | POA: Diagnosis not present

## 2015-04-17 DIAGNOSIS — Z87410 Personal history of cervical dysplasia: Secondary | ICD-10-CM | POA: Insufficient documentation

## 2015-04-17 DIAGNOSIS — M25511 Pain in right shoulder: Secondary | ICD-10-CM | POA: Diagnosis not present

## 2015-04-17 DIAGNOSIS — Z9289 Personal history of other medical treatment: Secondary | ICD-10-CM | POA: Diagnosis not present

## 2015-04-17 LAB — BASIC METABOLIC PANEL
Anion gap: 10 (ref 5–15)
BUN: 10 mg/dL (ref 6–20)
CALCIUM: 9.4 mg/dL (ref 8.9–10.3)
CO2: 24 mmol/L (ref 22–32)
CREATININE: 0.8 mg/dL (ref 0.44–1.00)
Chloride: 105 mmol/L (ref 101–111)
GFR calc Af Amer: >60 mL/min (ref >60)
Glucose, Bld: 92 mg/dL (ref 65–99)
Potassium: 3.9 mmol/L (ref 3.5–5.1)
SODIUM: 139 mmol/L (ref 135–145)

## 2015-04-17 LAB — CBC
HCT: 38.9 % (ref 36.0–46.0)
Hemoglobin: 12.7 g/dL (ref 12.0–15.0)
MCH: 27.1 pg (ref 26.0–34.0)
MCHC: 32.6 g/dL (ref 30.0–36.0)
MCV: 82.9 fL (ref 78.0–100.0)
PLATELETS: 211 10*3/uL (ref 150–400)
RBC: 4.69 MIL/uL (ref 3.87–5.11)
RDW: 14.9 % (ref 11.5–15.5)
WBC: 5.9 10*3/uL (ref 4.0–10.5)

## 2015-04-17 MED ORDER — HYDROCODONE-ACETAMINOPHEN 5-325 MG PO TABS: 1.0000 | ORAL_TABLET | Freq: Four times a day (QID) | ORAL | Status: DC | PRN

## 2015-04-17 MED ORDER — TRAMADOL HCL 50 MG PO TABS
50.0000 mg | ORAL_TABLET | Freq: Once | ORAL | Status: AC
  Administered 2015-04-17: 50 mg via ORAL
  Filled 2015-04-17: qty 1

## 2015-04-17 MED ORDER — ONDANSETRON 4 MG PO TBDP
4.0000 mg | ORAL_TABLET | Freq: Once | ORAL | Status: AC
  Administered 2015-04-17: 4 mg via ORAL
  Filled 2015-04-17: qty 1

## 2015-04-17 NOTE — ED Notes (Signed)
Pt departed in NAD and called a cab for a ride home.

## 2015-04-17 NOTE — ED Provider Notes (Signed)
CSN: [id number]     Arrival date & time 04/17/15  1832 History   First MD Initiated Contact with Patient 04/17/15 2146     Chief Complaint  Patient presents with  . Groin Pain     (Consider location/radiation/quality/duration/timing/severity/associated sxs/prior Treatment) Patient is a 55 y.o. female presenting with groin pain. The history is provided by the patient.  Groin Pain Pertinent negatives include no abdominal pain and no shortness of breath.  Patient c/o right groin/hip area pain and right shoulder pain for the past few days. Pain constant, dull, moderate. Non radiating. Worse w certain movements and palpation. No hx same pain. Pt indicates hands and feet feel swollen. No trauma, fall, or injury. Denies chest pain or discomfort. No orthopnea or pnd. No calf pain/leg swelling.  No recent surgery, immobility, trauma or travel. No recent change in meds. Denies fever, chills/sweats. No numbness/weakness or change in normal functional ability. No acute or abrupt change in symptoms tonight.      Past Medical History  Diagnosis Date  . Uterine fibroid   . Hyperlipidemia   . Hypertension   . Asthma   . Seasonal allergies   . Anxiety   . Headache(784.0)   . History of breast cancer     2010--  LEFT  s/p  mastectomy (in [place]) AND CHEMORADIATION--  NO RECURRENCE  . GERD (gastroesophageal reflux disease)   . Cervical stenosis (uterine cervix)   . Arthritis     hands, knees, hips  . Positive H. pylori test     08-05-2013  . Pelvic pain in female   . History of cervical dysplasia   . Refusal of blood transfusions as patient is Jehovah's Witness   . Dyspnea on exertion   . Wears glasses   . History of TB skin testing     [place]  . OSA (obstructive sleep apnea) moderate osa per study  09/2010    CPAP  , NOT USING ON REGULAR BASIS   Past Surgical History  Procedure Laterality Date  . Eye surgery  1961    right  . Cervical conization w/bx  2012   in  [place]  .  Colonoscopy  2011    normal per patient - [place]  . Cholecystectomy N/A 05/14/2012    Procedure: LAPAROSCOPIC CHOLECYSTECTOMY;  Surgeon: Ralene Ok, MD;  Location: [facility];  Service: General;  Laterality: N/A;  . Dilation and curettage of uterus  10/15/2011    Procedure: DILATATION AND CURETTAGE;  Surgeon: Melina Schools, MD;  Location: [facility];  Service: Gynecology;  Laterality: N/A;  Conization &  endocervical curettings  . Cardiovascular stress test  10-29-2012    low risk perfusion study/  no significant reversibity/ ef 66%/  normal wall motion  . Transthoracic echocardiogram  10-29-2012    mild lvh/  ef 60-65%/  grade II diastolic dysfunction/  trivial mr  &  tr  . Mastectomy Left 11/2008  in [place]   . Examination under anesthesia N/A 08/20/2013    Procedure: EXAM UNDER ANESTHESIA;  Surgeon: Margarette Asal, MD;  Location: [facility];  Service: Gynecology;  Laterality: N/A;  . Laparoscopic assisted vaginal hysterectomy N/A 10/26/2014    Procedure: HYSTERECTOMY ABDOMINAL ;  Surgeon: Molli Posey, MD;  Location: [facility];  Service: Gynecology;  Laterality: N/A;  . Salpingoophorectomy Bilateral 10/26/2014    Procedure: BILATERAL SALPINGO OOPHORECTOMY;  Surgeon: Molli Posey, MD;  Location: [facility];  Service: Gynecology;  Laterality: Bilateral;   Family History  Problem Relation Age of Onset  . Breast cancer Mother   . Colon cancer Mother   . Hypotension Mother   . Asthma Mother   . Diabetes type II Mother   . Arthritis Mother   . Clotting disorder Mother   . Cancer Mother     breast/colon  . Mental illness Brother   . Heart disease Brother   . Cerebral palsy Daughter   . Emphysema Brother     never smoker  . Colon cancer Maternal Aunt   . Cancer Maternal Aunt     colon  . Colon cancer Maternal Uncle   . Cancer Maternal Uncle     colon  . Esophageal cancer Neg Hx   . Rectal cancer Neg Hx   . Stomach cancer Neg Hx   . Thyroid disease Neg Hx     Social History  Substance Use Topics  . Smoking status: Former Smoker -- 0.30 packs/day for 10 years    Types: Cigarettes    Quit date: 03/26/1988  . Smokeless tobacco: Never Used  . Alcohol Use: No   OB History    Gravida Para Term Preterm AB TAB SAB Ectopic Multiple Living   10 5 5  5 2 3   5      Review of Systems  Constitutional: Negative for chills.  HENT: Negative for sore throat.   Eyes: Negative for redness.  Respiratory: Negative for cough and shortness of breath.   Cardiovascular: Negative for leg swelling.  Gastrointestinal: Negative for vomiting, abdominal pain and diarrhea.  Endocrine: Negative for polyuria.  Genitourinary: Negative for dysuria and flank pain.  Musculoskeletal: Negative for back pain and neck pain.  Skin: Negative for rash.  Neurological: Negative for weakness and numbness.  Hematological: Does not bruise/bleed easily.  Psychiatric/Behavioral: Negative for confusion.      Allergies  Adhesive; Calcium carbonate antacid; Hctz; Latex; Lisinopril; and Losartan potassium  Home Medications   Prior to Admission medications   Medication Sig Start Date End Date Taking? Authorizing Provider  albuterol (VENTOLIN HFA) 108 (90 BASE) MCG/ACT inhaler Inhale 2 puffs into the lungs every 4 (four) hours as needed for wheezing or shortness of breath.     Historical Provider, MD  amLODipine (NORVASC) 10 MG tablet Take 1 tablet (10 mg total) by mouth every morning. 10/11/14   Gildardo Cranker, DO  docusate sodium (COLACE) 100 MG capsule Take 1 capsule (100 mg total) by mouth every 12 (twelve) hours. 11/03/14   West Pugh, NP  ibuprofen (ADVIL,MOTRIN) 800 MG tablet Take 1 tablet (800 mg total) by mouth every 8 (eight) hours as needed for moderate pain (mild pain). 10/28/14   Molli Posey, MD   BP 170/93 mmHg  Pulse 79  Temp(Src) 99 F (37.2 C) (Oral)  Ht 5\' 7"  (1.702 m)  Wt 120.26 kg  BMI 41.51 kg/m2  SpO2 100%  LMP 02/25/2009 Physical Exam   Constitutional: She is oriented to person, place, and time. She appears well-developed and well-nourished. No distress.  HENT:  Mouth/Throat: Oropharynx is clear and moist.  Eyes: Conjunctivae are normal. No scleral icterus.  Neck: Normal range of motion. Neck supple. No JVD present. No tracheal deviation present.  Cardiovascular: Normal rate, regular rhythm, normal heart sounds and intact distal pulses.  Exam reveals no gallop and no friction rub.   No murmur heard. Pulmonary/Chest: Effort normal and breath sounds normal. No respiratory distress.  Abdominal: Soft. Normal  appearance. She exhibits no distension and no mass. There is no tenderness. There is no rebound and no guarding.  obese  Genitourinary:  No cva tenderness  Musculoskeletal: She exhibits no edema or tenderness.  Tenderness right shoulder and right hip and pain w active rom.  No crepitus. No sts. No skin changes or erythema. Minimal discomfort w passive rom. Distal pulses palp bil.  No asymmetric and/or appreciable swelling to one extremities or extremities.   Neurological: She is alert and oriented to person, place, and time.  Motor intact bil, stre 5/5. sens grossly intact.   Skin: Skin is warm and dry. No rash noted. She is not diaphoretic.  Psychiatric: She has a normal mood and affect.  Nursing note and vitals reviewed.   ED Course  Procedures (including critical care time) Labs Review   Results for orders placed or performed during the hospital encounter of [id number]  Basic metabolic panel  Result Value Ref Range   Sodium 139 135 - 145 mmol/L   Potassium 3.9 3.5 - 5.1 mmol/L   Chloride 105 101 - 111 mmol/L   CO2 24 22 - 32 mmol/L   Glucose, Bld 92 65 - 99 mg/dL   BUN 10 6 - 20 mg/dL   Creatinine, Ser 0.80 0.44 - 1.00 mg/dL   Calcium 9.4 8.9 - 10.3 mg/dL   GFR calc non Af Amer >60 >60 mL/min   GFR calc Af Amer >60 >60 mL/min   Anion gap 10 5 - 15  CBC  Result Value Ref Range   WBC 5.9 4.0 - 10.5 K/uL    RBC 4.69 3.87 - 5.11 MIL/uL   Hemoglobin 12.7 12.0 - 15.0 g/dL   HCT 38.9 36.0 - 46.0 %   MCV 82.9 78.0 - 100.0 fL   MCH 27.1 26.0 - 34.0 pg   MCHC 32.6 30.0 - 36.0 g/dL   RDW 14.9 11.5 - 15.5 %   Platelets 211 150 - 400 K/uL   Dg Chest 2 View  04/17/2015  CLINICAL DATA:  Shortness of breath for 2 days. Asthma. Pneumonia. Breast carcinoma. EXAM: CHEST  2 VIEW COMPARISON:  11/19/2013 FINDINGS: The heart size and mediastinal contours are within normal limits. Both lungs are clear. No evidence of pneumothorax or pleural effusion. Left breast soft tissue expander seen in place as well surgical clips in the left axilla. IMPRESSION: No active cardiopulmonary disease. Electronically Signed   By: Earle Gell M.D.   On: 04/17/2015 19:49   Dg Shoulder Right  04/17/2015  CLINICAL DATA:  Right shoulder pain down the right arm to the hand. Onset last hour while waiting in the ED waiting room. EXAM: RIGHT SHOULDER - 2+ VIEW COMPARISON:  None. FINDINGS: There is no evidence of fracture or dislocation. There is no evidence of arthropathy or other focal bone abnormality. Soft tissues are unremarkable. IMPRESSION: Negative. Electronically Signed   By: Lucienne Capers M.D.   On: 04/17/2015 23:08   Dg Hip Unilat W Or W/o Pelvis 2-3 Views Right  04/17/2015  CLINICAL DATA:  Anterior right hip pain radiating posteriorly for 2 days. Groin pain. No reported injury. EXAM: DG HIP (WITH OR WITHOUT PELVIS) 2-3V RIGHT COMPARISON:  Abdomen 07/30/2013 FINDINGS: Degenerative changes in the right hip. Prominent osteophytes off of the acetabulum may cause CAM type impingement syndrome. No evidence of acute fracture or dislocation. No focal bone lesion or bone destruction. Soft tissues are unremarkable. IMPRESSION: Prominent degenerative changes in the right hip. No acute bony abnormalities. Electronically Signed  By: Lucienne Capers M.D.   On: 04/17/2015 23:06      I have personally reviewed and evaluated these images and lab  results as part of my medical decision-making.   EKG Interpretation   Date/Time:  Sunday April 17 2015 19:00:18 EST Ventricular Rate:  77 PR Interval:  160 QRS Duration: 84 QT Interval:  398 QTC Calculation: 450 R Axis:   34 Text Interpretation:  Normal sinus rhythm Normal ECG No significant change  since last tracing Confirmed by Ashok Cordia  MD, Lennette Bihari ([id number]) on 04/17/2015  9:46:07 PM      MDM   Pt requests pain medication.  Ultram po.  Imaging studies.  Reviewed labs sent from triage.  No appreciable swelling noted on exam. Afeb. No pain w passive rom.    No finding septic joint. No focal sts. No skin changes or erythema.  Pt currently appears stable for d/c.   rec close pcp f/u.  Return precautions provided.     Lajean Saver, MD 04/17/15 2312

## 2015-04-17 NOTE — Discharge Instructions (Signed)
It was our pleasure to provide your ER care today - we hope that you feel better.  Take motrin or aleve as need for pain. You may also take hydrocodone as need for pain. No driving when taking hydrocodone. Also, do not take tylenol or acetaminophen containing medication when taking hydrocodone.  Follow up with your primary care doctor in the coming week for recheck.   Your blood pressure is high tonight.  Continue blood pressure medication, limit salt intake, and follow up with your doctor for recheck in coming week.   Return to ER if worse, new symptoms, fevers, increased swelling, difficulty breathing, medical emergency, other concern.  You were given pain medication in the ER - no driving for the next 4 hours.     Shoulder Pain The shoulder is the joint that connects your arms to your body. The bones that form the shoulder joint include the upper arm bone (humerus), the shoulder blade (scapula), and the collarbone (clavicle). The top of the humerus is shaped like a ball and fits into a rather flat socket on the scapula (glenoid cavity). A combination of muscles and strong, fibrous tissues that connect muscles to bones (tendons) support your shoulder joint and hold the ball in the socket. Small, fluid-filled sacs (bursae) are located in different areas of the joint. They act as cushions between the bones and the overlying soft tissues and help reduce friction between the gliding tendons and the bone as you move your arm. Your shoulder joint allows a wide range of motion in your arm. This range of motion allows you to do things like scratch your back or throw a ball. However, this range of motion also makes your shoulder more prone to pain from overuse and injury. Causes of shoulder pain can originate from both injury and overuse and usually can be grouped in the following four categories:  Redness, swelling, and pain (inflammation) of the tendon (tendinitis) or the bursae  (bursitis).  Instability, such as a dislocation of the joint.  Inflammation of the joint (arthritis).  Broken bone (fracture). HOME CARE INSTRUCTIONS   Apply ice to the sore area.  Put ice in a plastic bag.  Place a towel between your skin and the bag.  Leave the ice on for 15-20 minutes, 3-4 times per day for the first 2 days, or as directed by your health care provider.  Stop using cold packs if they do not help with the pain.  If you have a shoulder sling or immobilizer, wear it as long as your caregiver instructs. Only remove it to shower or bathe. Move your arm as little as possible, but keep your hand moving to prevent swelling.  Squeeze a soft ball or foam pad as much as possible to help prevent swelling.  Only take over-the-counter or prescription medicines for pain, discomfort, or fever as directed by your caregiver. SEEK MEDICAL CARE IF:   Your shoulder pain increases, or new pain develops in your arm, hand, or fingers.  Your hand or fingers become cold and numb.  Your pain is not relieved with medicines. SEEK IMMEDIATE MEDICAL CARE IF:   Your arm, hand, or fingers are numb or tingling.  Your arm, hand, or fingers are significantly swollen or turn white or blue. MAKE SURE YOU:   Understand these instructions.  Will watch your condition.  Will get help right away if you are not doing well or get worse.   This information is not intended to replace advice given to  you by your health care provider. Make sure you discuss any questions you have with your health care provider.   Document Released: 12/20/2004 Document Revised: 04/02/2014 Document Reviewed: 07/05/2014 Elsevier Interactive Patient Education 2016 Elsevier Inc.  Hip Pain Your hip is the joint between your upper legs and your lower pelvis. The bones, cartilage, tendons, and muscles of your hip joint perform a lot of work each day supporting your body weight and allowing you to move around. Hip pain  can range from a minor ache to severe pain in one or both of your hips. Pain may be felt on the inside of the hip joint near the groin, or the outside near the buttocks and upper thigh. You may have swelling or stiffness as well.  HOME CARE INSTRUCTIONS   Take medicines only as directed by your health care provider.  Apply ice to the injured area:  Put ice in a plastic bag.  Place a towel between your skin and the bag.  Leave the ice on for 15-20 minutes at a time, 3-4 times a day.  Keep your leg raised (elevated) when possible to lessen swelling.  Avoid activities that cause pain.  Follow specific exercises as directed by your health care provider.  Sleep with a pillow between your legs on your most comfortable side.  Record how often you have hip pain, the location of the pain, and what it feels like. SEEK MEDICAL CARE IF:   You are unable to put weight on your leg.  Your hip is red or swollen or very tender to touch.  Your pain or swelling continues or worsens after 1 week.  You have increasing difficulty walking.  You have a fever. SEEK IMMEDIATE MEDICAL CARE IF:   You have fallen.  You have a sudden increase in pain and swelling in your hip. MAKE SURE YOU:   Understand these instructions.  Will watch your condition.  Will get help right away if you are not doing well or get worse.   This information is not intended to replace advice given to you by your health care provider. Make sure you discuss any questions you have with your health care provider.   Document Released: 08/30/2009 Document Revised: 04/02/2014 Document Reviewed: 11/06/2012 Elsevier Interactive Patient Education 2016 Elsevier Inc.   Edema Edema is an abnormal buildup of fluids in your bodytissues. Edema is somewhatdependent on gravity to pull the fluid to the lowest place in your body. That makes the condition more common in the legs and thighs (lower extremities). Painless swelling of the  feet and ankles is common and becomes more likely as you get older. It is also common in looser tissues, like around your eyes.  When the affected area is squeezed, the fluid may move out of that spot and leave a dent for a few moments. This dent is called pitting.  CAUSES  There are many possible causes of edema. Eating too much salt and being on your feet or sitting for a long time can cause edema in your legs and ankles. Hot weather may make edema worse. Common medical causes of edema include:  Heart failure.  Liver disease.  Kidney disease.  Weak blood vessels in your legs.  Cancer.  An injury.  Pregnancy.  Some medications.  Obesity. SYMPTOMS  Edema is usually painless.Your skin may look swollen or shiny.  DIAGNOSIS  Your health care provider may be able to diagnose edema by asking about your medical history and doing a physical  exam. You may need to have tests such as X-rays, an electrocardiogram, or blood tests to check for medical conditions that may cause edema.  TREATMENT  Edema treatment depends on the cause. If you have heart, liver, or kidney disease, you need the treatment appropriate for these conditions. General treatment may include:  Elevation of the affected body part above the level of your heart.  Compression of the affected body part. Pressure from elastic bandages or support stockings squeezes the tissues and forces fluid back into the blood vessels. This keeps fluid from entering the tissues.  Restriction of fluid and salt intake.  Use of a water pill (diuretic). These medications are appropriate only for some types of edema. They pull fluid out of your body and make you urinate more often. This gets rid of fluid and reduces swelling, but diuretics can have side effects. Only use diuretics as directed by your health care provider. HOME CARE INSTRUCTIONS   Keep the affected body part above the level of your heart when you are lying down.   Do not sit  still or stand for prolonged periods.   Do not put anything directly under your knees when lying down.  Do not wear constricting clothing or garters on your upper legs.   Exercise your legs to work the fluid back into your blood vessels. This may help the swelling go down.   Wear elastic bandages or support stockings to reduce ankle swelling as directed by your health care provider.   Eat a low-salt diet to reduce fluid if your health care provider recommends it.   Only take medicines as directed by your health care provider. SEEK MEDICAL CARE IF:   Your edema is not responding to treatment.  You have heart, liver, or kidney disease and notice symptoms of edema.  You have edema in your legs that does not improve after elevating them.   You have sudden and unexplained weight gain. SEEK IMMEDIATE MEDICAL CARE IF:   You develop shortness of breath or chest pain.   You cannot breathe when you lie down.  You develop pain, redness, or warmth in the swollen areas.   You have heart, liver, or kidney disease and suddenly get edema.  You have a fever and your symptoms suddenly get worse. MAKE SURE YOU:   Understand these instructions.  Will watch your condition.  Will get help right away if you are not doing well or get worse.   This information is not intended to replace advice given to you by your health care provider. Make sure you discuss any questions you have with your health care provider.   Document Released: 03/12/2005 Document Revised: 04/02/2014 Document Reviewed: 01/02/2013 Elsevier Interactive Patient Education 2016 Elsevier Inc.   Osteoarthritis Osteoarthritis is a disease that causes soreness and inflammation of a joint. It occurs when the cartilage at the affected joint wears down. Cartilage acts as a cushion, covering the ends of bones where they meet to form a joint. Osteoarthritis is the most common form of arthritis. It often occurs in older people.  The joints affected most often by this condition include those in the:  Ends of the fingers.  Thumbs.  Neck.  Lower back.  Knees.  Hips. CAUSES  Over time, the cartilage that covers the ends of bones begins to wear away. This causes bone to rub on bone, producing pain and stiffness in the affected joints.  RISK FACTORS Certain factors can increase your chances of having osteoarthritis, including:  Older age.  Excessive body weight.  Overuse of joints.  Previous joint injury. SIGNS AND SYMPTOMS   Pain, swelling, and stiffness in the joint.  Over time, the joint may lose its normal shape.  Small deposits of bone (osteophytes) may grow on the edges of the joint.  Bits of bone or cartilage can break off and float inside the joint space. This may cause more pain and damage. DIAGNOSIS  Your health care provider will do a physical exam and ask about your symptoms. Various tests may be ordered, such as:  X-rays of the affected joint.  Blood tests to rule out other types of arthritis. Additional tests may be used to diagnose your condition. TREATMENT  Goals of treatment are to control pain and improve joint function. Treatment plans may include:  A prescribed exercise program that allows for rest and joint relief.  A weight control plan.  Pain relief techniques, such as:  Properly applied heat and cold.  Electric pulses delivered to nerve endings under the skin (transcutaneous electrical nerve stimulation [TENS]).  Massage.  Certain nutritional supplements.  Medicines to control pain, such as:  Acetaminophen.  Nonsteroidal anti-inflammatory drugs (NSAIDs), such as naproxen.  Narcotic or central-acting agents, such as tramadol.  Corticosteroids. These can be given orally or as an injection.  Surgery to reposition the bones and relieve pain (osteotomy) or to remove loose pieces of bone and cartilage. Joint replacement may be needed in advanced states of  osteoarthritis. HOME CARE INSTRUCTIONS   Take medicines only as directed by your health care provider.  Maintain a healthy weight. Follow your health care provider's instructions for weight control. This may include dietary instructions.  Exercise as directed. Your health care provider can recommend specific types of exercise. These may include:  Strengthening exercises. These are done to strengthen the muscles that support joints affected by arthritis. They can be performed with weights or with exercise bands to add resistance.  Aerobic activities. These are exercises, such as brisk walking or low-impact aerobics, that get your heart pumping.  Range-of-motion activities. These keep your joints limber.  Balance and agility exercises. These help you maintain daily living skills.  Rest your affected joints as directed by your health care provider.  Keep all follow-up visits as directed by your health care provider. SEEK MEDICAL CARE IF:   Your skin turns red.  You develop a rash in addition to your joint pain.  You have worsening joint pain.  You have a fever along with joint or muscle aches. SEEK IMMEDIATE MEDICAL CARE IF:  You have a significant loss of weight or appetite.  You have night sweats. [place] of Arthritis and Musculoskeletal and Skin Diseases: www.niams.SouthExposed.es  [employer] on Aging: http://kim-miller.com/  American College of Rheumatology: www.rheumatology.org   This information is not intended to replace advice given to you by your health care provider. Make sure you discuss any questions you have with your health care provider.   Document Released: 03/12/2005 Document Revised: 04/02/2014 Document Reviewed: 11/17/2012 Elsevier Interactive Patient Education [employer].

## 2015-04-17 NOTE — ED Notes (Signed)
Pt c/o couple days of right groin pain, B/L leg swelling and shortness of breath. Pt reports shortness of breath at rest.

## 2015-04-18 DIAGNOSIS — H40001 Preglaucoma, unspecified, right eye: Secondary | ICD-10-CM | POA: Insufficient documentation

## 2015-04-18 DIAGNOSIS — H52203 Unspecified astigmatism, bilateral: Secondary | ICD-10-CM | POA: Diagnosis not present

## 2015-04-18 DIAGNOSIS — H5213 Myopia, bilateral: Secondary | ICD-10-CM | POA: Diagnosis not present

## 2015-04-18 DIAGNOSIS — H2513 Age-related nuclear cataract, bilateral: Secondary | ICD-10-CM | POA: Diagnosis not present

## 2015-04-18 DIAGNOSIS — H53023 Refractive amblyopia, bilateral: Secondary | ICD-10-CM | POA: Diagnosis not present

## 2015-04-19 ENCOUNTER — Encounter (HOSPITAL_COMMUNITY)
Admission: RE | Admit: 2015-04-19 | Discharge: 2015-04-19 | Disposition: A | Discharge status: Home / Self Care | Payer: Medicare Other | Source: Ambulatory Visit | Attending: Oncology | Admitting: Oncology

## 2015-04-19 ENCOUNTER — Other Ambulatory Visit: Payer: Self-pay | Admitting: Nurse Practitioner

## 2015-04-19 ENCOUNTER — Encounter (HOSPITAL_COMMUNITY): Payer: Self-pay

## 2015-04-19 ENCOUNTER — Ambulatory Visit (HOSPITAL_COMMUNITY)
Admission: RE | Admit: 2015-04-19 | Discharge: 2015-04-19 | Disposition: A | Discharge status: Home / Self Care | Payer: Medicare Other | Source: Ambulatory Visit | Attending: Oncology | Admitting: Oncology

## 2015-04-19 DIAGNOSIS — C50912 Malignant neoplasm of unspecified site of left female breast: Secondary | ICD-10-CM | POA: Diagnosis not present

## 2015-04-19 DIAGNOSIS — C50112 Malignant neoplasm of central portion of left female breast: Secondary | ICD-10-CM

## 2015-04-19 DIAGNOSIS — C50812 Malignant neoplasm of overlapping sites of left female breast: Secondary | ICD-10-CM | POA: Insufficient documentation

## 2015-04-19 DIAGNOSIS — E042 Nontoxic multinodular goiter: Secondary | ICD-10-CM | POA: Insufficient documentation

## 2015-04-19 DIAGNOSIS — M542 Cervicalgia: Secondary | ICD-10-CM | POA: Diagnosis not present

## 2015-04-19 HISTORY — DX: Malignant (primary) neoplasm, unspecified: C80.1

## 2015-04-19 MED ORDER — IOHEXOL 300 MG/ML  SOLN
75.0000 mL | Freq: Once | INTRAMUSCULAR | Status: AC | PRN
  Administered 2015-04-19: 75 mL via INTRAVENOUS

## 2015-04-19 MED ORDER — TECHNETIUM TC 99M MEDRONATE IV KIT
25.6000 | PACK | Freq: Once | INTRAVENOUS | Status: AC | PRN
  Administered 2015-04-19: 25.6 via INTRAVENOUS

## 2015-04-26 ENCOUNTER — Telehealth: Payer: Self-pay | Admitting: Oncology

## 2015-04-26 ENCOUNTER — Ambulatory Visit (HOSPITAL_BASED_OUTPATIENT_CLINIC_OR_DEPARTMENT_OTHER): Payer: Medicare Other | Admitting: Oncology

## 2015-04-26 ENCOUNTER — Ambulatory Visit (HOSPITAL_COMMUNITY)
Admission: RE | Admit: 2015-04-26 | Discharge: 2015-04-26 | Disposition: A | Discharge status: Home / Self Care | Payer: Medicare Other | Source: Ambulatory Visit | Attending: Oncology | Admitting: Oncology

## 2015-04-26 VITALS — BP 136/80 | HR 68 | Temp 97.9°F | Resp 18 | Ht 67.0 in | Wt 260.0 lb

## 2015-04-26 DIAGNOSIS — M79602 Pain in left arm: Secondary | ICD-10-CM | POA: Insufficient documentation

## 2015-04-26 DIAGNOSIS — C50812 Malignant neoplasm of overlapping sites of left female breast: Secondary | ICD-10-CM | POA: Diagnosis not present

## 2015-04-26 DIAGNOSIS — R948 Abnormal results of function studies of other organs and systems: Secondary | ICD-10-CM | POA: Diagnosis not present

## 2015-04-26 DIAGNOSIS — R599 Enlarged lymph nodes, unspecified: Secondary | ICD-10-CM | POA: Diagnosis not present

## 2015-04-26 DIAGNOSIS — E049 Nontoxic goiter, unspecified: Secondary | ICD-10-CM

## 2015-04-26 DIAGNOSIS — I890 Lymphedema, not elsewhere classified: Secondary | ICD-10-CM | POA: Diagnosis not present

## 2015-04-26 DIAGNOSIS — Z853 Personal history of malignant neoplasm of breast: Secondary | ICD-10-CM

## 2015-04-26 DIAGNOSIS — M79622 Pain in left upper arm: Secondary | ICD-10-CM | POA: Diagnosis not present

## 2015-04-26 NOTE — Telephone Encounter (Signed)
Appointments made and avs printed for patient °

## 2015-04-26 NOTE — Progress Notes (Signed)
ID: Michaela Little   DOB: [birth year]  MR#: [id number]  SOB#:[id number]  PCP: Kirt Boys, DO SU:  Axel Filler GYN: Sabas Sous, MD OTHER MD: Etter Sjogren, Stan Head, Sandrea Hughs  CHIEF COMPLAINT: estrogen receptor positive breast cancer  CURRENT TREATMENT:  observation  BREAST CANCER HISTORY:  Michaela Little is a [place] woman who moved to [facility] in 2013 with a history of breast cancer, and establishied herself with our practice.  The patient had left breast and left axillary lymph node biopsy performed March of 2010, both positive (I do not have the pathology report at this point). She was treated neo-adjuvantly at [facility] with (a) paclitaxel weekly x12 and (b) doxorubicin/cyclophosphamide in dose dense fashion x4, both given with tifiparnib.   She then proceeded to left modified radical mastectomy 12/23/2008, the final pathology showing a 4 cm residual invasive lobular carcinoma, involving 5/22 lymph nodes sampled. The tumor was estrogen and progesterone receptor positive, HER-2 negative.   Postoperatively she received 50 gray of radiation completed January of 2011, including the left supraclavicular lymph node basin. She was then started on tamoxifen, but developed some uterine lining thickening and was switched to anastrozole in May 2012. Her subsequent history is as detailed below  INTERVAL HISTORY: Tyreonna returns today for follow-up of her breast cancer accompanied by a friend.  Since the last visit here she was restaged with a CT scan of the chest, own scan, and plain right hip films. There was no evidence of disease recurrence. In particular the left supraclavicular adenopathy that was minimally palpable is stable from a scan 3 years ago and now appears completely normal. She has a goiter as previously noted. The bone scan showed a possible abnormality in a rib, but this was reviewed from the CT scan obtained earlier this month and there is no finding there.  The bone scan also showed a slight area of uptake in the proximal right humerus, and also in the distal left humerus ( not mentioned in the study but reviewed with radiologist) and these were likely benign as well but will require additional films. In short she is now more than 6 years out from completion of treatment with no evidence of disease recurrence. This is very favorable.   REVIEW OF SYSTEMS: Rmoni  Continues to be worried about her multiple areas of pain. She also thinks her left breast, which is status post mastectomy and reconstruction, may be getting darker. She hurts in her chest and legs as well as the lower back and other joints. Walking is difficult for her. Of course she is morbidly obese and this certainly will contribute to the arthritis problem. Sometimes she has trouble swallowing. This may be related to her goiter. She short of breath with moderate activity. She keeps a dry cough. Rarely she has a little bit of nausea. Otherwise a detailed review of systems today was stable  PAST MEDICAL HISTORY: Past Medical History  Diagnosis Date  . Uterine fibroid   . Hyperlipidemia   . Hypertension   . Asthma   . Seasonal allergies   . Anxiety   . Headache(784.0)   . History of breast cancer     2010--  LEFT  s/p  mastectomy (in [place]) AND CHEMORADIATION--  NO RECURRENCE  . GERD (gastroesophageal reflux disease)   . Cervical stenosis (uterine cervix)   . Arthritis     hands, knees, hips  . Positive H. pylori test     08-05-2013  . Pelvic pain in  female   . History of cervical dysplasia   . Refusal of blood transfusions as patient is Jehovah's Witness   . Dyspnea on exertion   . Wears glasses   . History of TB skin testing     [place]--  [place]/ MEDS  . OSA (obstructive sleep apnea) moderate osa per study  09/2010    CPAP  , NOT USING ON REGULAR BASIS  . Cancer [facility])     PAST SURGICAL HISTORY: Past Surgical History  Procedure Laterality Date  . Eye surgery  1961     right  . Cervical conization w/bx  2012   in  [place]  . Colonoscopy  2011    normal per patient - [place]  . Cholecystectomy N/A 05/14/2012    Procedure: LAPAROSCOPIC CHOLECYSTECTOMY;  Surgeon: Axel Filler, MD;  Location: [facility] OR;  Service: General;  Laterality: N/A;  . Dilation and curettage of uterus  10/15/2011    Procedure: DILATATION AND CURETTAGE;  Surgeon: Bing Plume, MD;  Location: [facility] ORS;  Service: Gynecology;  Laterality: N/A;  Conization &  endocervical curettings  . Cardiovascular stress test  10-29-2012    low risk perfusion study/  no significant reversibity/ ef 66%/  normal wall motion  . Transthoracic echocardiogram  10-29-2012    mild lvh/  ef 60-65%/  grade II diastolic dysfunction/  trivial mr  &  tr  . Mastectomy Left 11/2008  in [place]    W/  TISSUE EXPANDER   . Examination under anesthesia N/A 08/20/2013    Procedure: EXAM UNDER ANESTHESIA;  Surgeon: Meriel Pica, MD;  Location: [facility];  Service: Gynecology;  Laterality: N/A;  . Laparoscopic assisted vaginal hysterectomy N/A 10/26/2014    Procedure: HYSTERECTOMY ABDOMINAL ;  Surgeon: Richarda Overlie, MD;  Location: [facility] ORS;  Service: Gynecology;  Laterality: N/A;  . Salpingoophorectomy Bilateral 10/26/2014    Procedure: BILATERAL SALPINGO OOPHORECTOMY;  Surgeon: Richarda Overlie, MD;  Location: [facility] ORS;  Service: Gynecology;  Laterality: Bilateral;    FAMILY HISTORY Family History  Problem Relation Age of Onset  . Breast cancer Mother   . Colon cancer Mother   . Hypotension Mother   . Asthma Mother   . Diabetes type II Mother   . Arthritis Mother   . Clotting disorder Mother   . Cancer Mother     breast/colon  . Mental illness Brother   . Heart disease Brother   . Cerebral palsy Daughter   . Emphysema Brother     never smoker  . Colon cancer Maternal Aunt   . Cancer Maternal Aunt     colon  . Colon cancer Maternal Uncle   . Cancer Maternal Uncle     colon  . Esophageal cancer Neg Hx   .  Rectal cancer Neg Hx   . Stomach cancer Neg Hx   . Thyroid disease Neg Hx    Gynecologic history: She is GX P5. At first pregnancy to term age 1. Menarche age 64. Last menstrual period when she had her chemotherapy.   Social History: She is disabled secondary to arthritis. She moved from [place] to [place] for "peace of mind". Of her 5 children only one daughter is living in [place], [place]. One daughter in [place] is incarcerated and another one is "in a lot of stress". The patient lives alone. She mostly reads and does computer games on her phone during the day. She has 20 grandchildren.    ADVANCED DIRECTIVES:  She does not have a healthcare power of attorney.    HEALTH MAINTENANCE: Social History  Substance Use Topics  . Smoking status: Former Smoker -- 0.30 packs/day for 10 years    Types: Cigarettes    Quit date: 03/26/1988  . Smokeless tobacco: Never Used  . Alcohol Use: No     Allergies  Allergen Reactions  . Adhesive [Tape] Itching and Other (See Comments)    REDDNESS  . Calcium Carbonate Antacid Hives    Fruit flavored Tums   . Hctz [Hydrochlorothiazide] Itching  . Latex Itching  . Lisinopril Itching  . Losartan Potassium Other (See Comments)    Makes her feel "bad"    Current Outpatient Prescriptions  Medication Sig Dispense Refill  . albuterol (VENTOLIN HFA) 108 (90 BASE) MCG/ACT inhaler Inhale 2 puffs into the lungs every 4 (four) hours as needed for wheezing or shortness of breath.     Marland Kitchen amLODipine (NORVASC) 10 MG tablet Take 1 tablet (10 mg total) by mouth every morning. 90 tablet 1  . docusate sodium (COLACE) 100 MG capsule Take 1 capsule (100 mg total) by mouth every 12 (twelve) hours. 60 capsule 0  . HYDROcodone-acetaminophen (NORCO/VICODIN) 5-325 MG tablet Take 1-2 tablets by mouth every 6 (six) hours as needed for moderate pain. 10 tablet 0  . ibuprofen (ADVIL,MOTRIN) 800 MG tablet Take 1 tablet (800 mg total) by mouth every 8 (eight) hours  as needed for moderate pain (mild pain). 30 tablet 1   No current facility-administered medications for this visit.    OBJECTIVE: Middle-aged African American woman  Filed Vitals:   04/26/15 1030  BP: 136/80  Pulse: 68  Temp: 97.9 F (36.6 C)  Resp: 18     Body mass index is 40.71 kg/(m^2).    ECOG FS: 1 Filed Weights   04/26/15 1030  Weight: 260 lb (117.935 kg)   Sclerae unicteric, EOMs intact Oropharynx clear,  Slightly dry No cervical or supraclavicular adenopathy palpated today Lungs no rales or rhonchi Heart regular rate and rhythm Abd soft, obese, nontender, positive bowel sounds MSK no focal spinal tenderness Neuro: nonfocal, well oriented,  anxious affect Breasts:  The right breast is unremarkable. The left breast is status post mastectomy with implant reconstruction. There is no evidence of local recurrence. The left axilla is benign.   LAB RESULTS: Lab Results  Component Value Date   WBC 5.9 04/17/2015   NEUTROABS 3.0 04/07/2015   HGB 12.7 04/17/2015   HCT 38.9 04/17/2015   MCV 82.9 04/17/2015   PLT 211 04/17/2015      Chemistry      Component Value Date/Time   NA 139 04/17/2015 2041   NA 141 04/07/2015 1046   NA 140 06/16/2014 0915   K 3.9 04/17/2015 2041   K 4.2 04/07/2015 1046   CL 105 04/17/2015 2041   CL 105 07/07/2012 1327   CO2 24 04/17/2015 2041   CO2 23 04/07/2015 1046   BUN 10 04/17/2015 2041   BUN 10.8 04/07/2015 1046   BUN 14 06/16/2014 0915   CREATININE 0.80 04/17/2015 2041   CREATININE 0.8 04/07/2015 1046      Component Value Date/Time   CALCIUM 9.4 04/17/2015 2041   CALCIUM 9.5 04/07/2015 1046   ALKPHOS 137 04/07/2015 1046   ALKPHOS 110 11/03/2014 1900   AST 16 04/07/2015 1046   AST 16 11/03/2014 1900   ALT <9 04/07/2015 1046   ALT 14 11/03/2014 1900   BILITOT 0.71 04/07/2015 1046   BILITOT  0.9 11/03/2014 1900   BILITOT 0.6 06/16/2014 0915       Lab Results  Component Value Date   LABCA2 19 01/17/2012      STUDIES: Dg Chest 2 View  04/17/2015  CLINICAL DATA:  Shortness of breath for 2 days. Asthma. Pneumonia. Breast carcinoma. EXAM: CHEST  2 VIEW COMPARISON:  11/19/2013 FINDINGS: The heart size and mediastinal contours are within normal limits. Both lungs are clear. No evidence of pneumothorax or pleural effusion. Left breast soft tissue expander seen in place as well surgical clips in the left axilla. IMPRESSION: No active cardiopulmonary disease. Electronically Signed   By: Earle Gell M.D.   On: 04/17/2015 19:49   Dg Shoulder Right  04/17/2015  CLINICAL DATA:  Right shoulder pain down the right arm to the hand. Onset last hour while waiting in the ED waiting room. EXAM: RIGHT SHOULDER - 2+ VIEW COMPARISON:  None. FINDINGS: There is no evidence of fracture or dislocation. There is no evidence of arthropathy or other focal bone abnormality. Soft tissues are unremarkable. IMPRESSION: Negative. Electronically Signed   By: Lucienne Capers M.D.   On: 04/17/2015 23:08   Ct Soft Tissue Neck W Contrast  04/19/2015  CLINICAL DATA:  Restaging of left-sided breast cancer. Neck pain. Thyroid goiter. EXAM: CT NECK WITH CONTRAST TECHNIQUE: Multidetector CT imaging of the neck was performed using the standard protocol following the bolus administration of intravenous contrast. CONTRAST:  7m OMNIPAQUE IOHEXOL 300 MG/ML  SOLN COMPARISON:  CT neck with contrast 03/12/2013 FINDINGS: Pharynx and larynx: No focal mucosal or submucosal lesions are evident. Vocal cords are midline and symmetric. The tongue base is unremarkable. Salivary glands: The parotid and submandibular glands are within normal limits bilaterally. Thyroid: The thyroid is somewhat heterogeneous. Multiple small nodules are present. The largest is on the right measuring 9 mm, unchanged. Lymph nodes: Sub cm lymph nodes are stable the slightly decreased in size since prior exam. There is some stranding in the posterior left level 2 station no significant  cervical adenopathy is present. Vascular: Negative Limited intracranial: Within normal limits Visualized orbits: Unremarkable Mastoids and visualized paranasal sinuses: Clear Skeleton: Vertebral body heights alignment are maintained. No focal lytic or blastic lesions are present. Upper chest: The lung apices are clear. Please see the dedicated CT chest report from the same day. IMPRESSION: 1. Stable multinodular goiter. 2. No significant cervical adenopathy or metastatic disease to the neck. Electronically Signed   By: CSan MorelleM.D.   On: 04/19/2015 12:01   Ct Chest W Contrast  04/19/2015  CLINICAL DATA:  Breast cancer. Evaluate recurrence. Subsequent treatment strategy. EXAM: CT CHEST WITH CONTRAST TECHNIQUE: Multidetector CT imaging of the chest was performed during intravenous contrast administration. CONTRAST:  734mOMNIPAQUE IOHEXOL 300 MG/ML  SOLN COMPARISON:  None. FINDINGS: Mediastinum/Nodes: No axillary supraclavicular lymphadenopathy. Breast expander at the LEFT mastectomy site. No mediastinal hilar adenopathy. The no mediastinal hilar adenopathy. No pericardial fluid. Esophagus normal. Central pulmonary embolism. Lungs/Pleura: No parenchymal pulmonary nodule. Pleural fluid. Airways are normal. Upper abdomen: Limited view of the upper abdomen unremarkable. Musculoskeletal: Limited view of the skeleton is unremarkable. IMPRESSION: No evidence of breast cancer recurrence or thoracic metastasis. Electronically Signed   By: StSuzy Bouchard.D.   On: 04/19/2015 14:06   Nm Bone Scan Whole Body  04/19/2015  CLINICAL DATA:  Left breast malignancy ; patient reports right rib, bilateral groin, and bilateral leg pain ; no recent trauma EXAM: NUCLEAR MEDICINE WHOLE BODY BONE SCAN TECHNIQUE: Whole  body anterior and posterior images were obtained approximately 3 hours after intravenous injection of radiopharmaceutical. RADIOPHARMACEUTICALS:  97.9 millicuries of technetium 72 M labeled MDP COMPARISON:   None in PACs FINDINGS: There is adequate uptake of the radiopharmaceutical by the skeleton. There is adequate soft tissue clearance and renal activity. Subtle increased uptake is noted in the distal third of the shaft of the right humerus. There is mildly increased uptake over the anterior aspect of the right fifth rib. There is mildly increased uptake over the costovertebral junction of the right eighth rib. Activity within the calvarium, cervical, and thoracic spines is normal. Minimal increased uptake posteriorly on the right at approximately L3 is noted. Activity within the bony pelvis is within the limits of normal. Activity within the hips is normal. There is mildly increased uptake associated with the knees and ankles that is likely degenerative in nature. Areas of increased uptake in the soft tissues of the left calf are likely related to venous stasis. IMPRESSION: 1. Mildly increased uptake in the anterior aspect of the right fifth rib and distal aspect of the shaft of the right humerus. A plain films of the anterior right ribs and of the right humerus are recommended. 2. Focally increased uptake in the bright eighth costovertebral junction is likely degenerative. Similarly increased uptake on the right in the posterior aspect of the mid lumbar spine is likely degenerative. 3. Increased uptake about the knees and ankles is consistent with degenerative change. Increased uptake associated with the soft tissues of the calf likely reflects venous stasis. Electronically Signed   By: David  Martinique M.D.   On: 04/19/2015 14:53   Dg Hip Unilat W Or W/o Pelvis 2-3 Views Right  04/17/2015  CLINICAL DATA:  Anterior right hip pain radiating posteriorly for 2 days. Groin pain. No reported injury. EXAM: DG HIP (WITH OR WITHOUT PELVIS) 2-3V RIGHT COMPARISON:  Abdomen 07/30/2013 FINDINGS: Degenerative changes in the right hip. Prominent osteophytes off of the acetabulum may cause CAM type impingement syndrome. No  evidence of acute fracture or dislocation. No focal bone lesion or bone destruction. Soft tissues are unremarkable. IMPRESSION: Prominent degenerative changes in the right hip. No acute bony abnormalities. Electronically Signed   By: Lucienne Capers M.D.   On: 04/17/2015 23:06   IMPRESSION: 55 y.o.  [place] woman relocated here from [place],    (1)  left-sided breast cancer diagnosed March of 2010 and treated neo-adjuvantly at [facility] with (a) paclitaxel weekly x12 and (b) doxorubicin/ cyclophosphamide in dose dense fashion x4, both given with tifiparnib.   (2) definitive left modified radical mastectomy September of 2010 for a T2 N2 or stage IIIA invasive lobular breast cancer, estrogen and progesterone receptor positive, HER-2 negative,   (3)  post mastectomy radiation completed January of 2011,  (4) briefly on tamoxifen, discontinued it because of uterine lining concerns.  On anastrozole since May 2012 with good tolerance. Held between November and December 2013 due to GI issues, unrelated to cancer or its treatment.  Discontinued by the patient summer of 2016  (5)  Other problems include left upper extremity lymphedema, borderline concern regarding genetic family history, history of cervical dysplasia, and history of dense breasts.  PLAN: Maud is doing very well from a breast cancer point of view and I reviewed all her recent scans with her. The cervical lymph nodes are very stable compared to 3 years ago and are benign. She does have a goiter of course. She has significant right hip arthritis. However there  are no bone lesions of concern. The rib lesion noted on the bone scan was reviewed by the radiologist and myself from her chest CT scan and there is no abnormality there. Unfortunately the humeri bilaterally were not included in either the chest x-ray or chest CT and these will have to be imaged separately.  At this point she is ready to "get out of the cancer business". She does  have significant arthritis and other comorbid conditions that she will continue to follow up on through her primary care physician.  In addition she is understandably concerned and anxious that she may yet have disease recurrence. For that reason I think she would be a very good candidate for our survivorship program. In addition I suggested she participate in out [place] and yoga programs.  Her next set of mammograms will be in April. I have scheduled her with our survivorship nurse practitioner to be seen in May. I have not made any further appointments for her with me here, but of course I will be glad to see her in the future if I when the need arises. Miarose Lippert C    04/26/2015

## 2015-05-06 DIAGNOSIS — Z87891 Personal history of nicotine dependence: Secondary | ICD-10-CM | POA: Diagnosis not present

## 2015-05-06 DIAGNOSIS — Z90710 Acquired absence of both cervix and uterus: Secondary | ICD-10-CM | POA: Diagnosis not present

## 2015-05-06 DIAGNOSIS — I10 Essential (primary) hypertension: Secondary | ICD-10-CM | POA: Diagnosis not present

## 2015-05-06 DIAGNOSIS — C7951 Secondary malignant neoplasm of bone: Secondary | ICD-10-CM | POA: Diagnosis not present

## 2015-05-06 DIAGNOSIS — R319 Hematuria, unspecified: Secondary | ICD-10-CM | POA: Diagnosis not present

## 2015-05-06 DIAGNOSIS — R198 Other specified symptoms and signs involving the digestive system and abdomen: Secondary | ICD-10-CM | POA: Diagnosis not present

## 2015-05-06 DIAGNOSIS — M5136 Other intervertebral disc degeneration, lumbar region: Secondary | ICD-10-CM | POA: Diagnosis not present

## 2015-05-06 DIAGNOSIS — Z853 Personal history of malignant neoplasm of breast: Secondary | ICD-10-CM | POA: Diagnosis not present

## 2015-05-06 DIAGNOSIS — N390 Urinary tract infection, site not specified: Secondary | ICD-10-CM | POA: Diagnosis not present

## 2015-05-06 DIAGNOSIS — Z3202 Encounter for pregnancy test, result negative: Secondary | ICD-10-CM | POA: Diagnosis not present

## 2015-05-06 DIAGNOSIS — Z9049 Acquired absence of other specified parts of digestive tract: Secondary | ICD-10-CM | POA: Diagnosis not present

## 2015-05-09 DIAGNOSIS — C50919 Malignant neoplasm of unspecified site of unspecified female breast: Secondary | ICD-10-CM | POA: Diagnosis not present

## 2015-05-09 DIAGNOSIS — Z87891 Personal history of nicotine dependence: Secondary | ICD-10-CM | POA: Diagnosis not present

## 2015-05-09 DIAGNOSIS — N3000 Acute cystitis without hematuria: Secondary | ICD-10-CM | POA: Diagnosis not present

## 2015-05-09 DIAGNOSIS — Z853 Personal history of malignant neoplasm of breast: Secondary | ICD-10-CM | POA: Diagnosis not present

## 2015-05-11 DIAGNOSIS — C50912 Malignant neoplasm of unspecified site of left female breast: Secondary | ICD-10-CM | POA: Diagnosis not present

## 2015-05-11 DIAGNOSIS — R0602 Shortness of breath: Secondary | ICD-10-CM | POA: Diagnosis not present

## 2015-05-13 DIAGNOSIS — C50912 Malignant neoplasm of unspecified site of left female breast: Secondary | ICD-10-CM | POA: Diagnosis not present

## 2015-05-13 DIAGNOSIS — C7951 Secondary malignant neoplasm of bone: Secondary | ICD-10-CM | POA: Diagnosis not present

## 2015-05-18 DIAGNOSIS — B353 Tinea pedis: Secondary | ICD-10-CM | POA: Diagnosis not present

## 2015-05-18 DIAGNOSIS — C50919 Malignant neoplasm of unspecified site of unspecified female breast: Secondary | ICD-10-CM | POA: Diagnosis not present

## 2015-05-18 DIAGNOSIS — I2699 Other pulmonary embolism without acute cor pulmonale: Secondary | ICD-10-CM | POA: Diagnosis not present

## 2015-05-18 DIAGNOSIS — I8290 Acute embolism and thrombosis of unspecified vein: Secondary | ICD-10-CM | POA: Diagnosis not present

## 2015-05-18 DIAGNOSIS — C50912 Malignant neoplasm of unspecified site of left female breast: Secondary | ICD-10-CM | POA: Diagnosis not present

## 2015-05-18 DIAGNOSIS — C7951 Secondary malignant neoplasm of bone: Secondary | ICD-10-CM | POA: Diagnosis not present

## 2015-05-24 DIAGNOSIS — C7951 Secondary malignant neoplasm of bone: Secondary | ICD-10-CM | POA: Diagnosis not present

## 2015-05-24 DIAGNOSIS — R930 Abnormal findings on diagnostic imaging of skull and head, not elsewhere classified: Secondary | ICD-10-CM | POA: Diagnosis not present

## 2015-05-24 DIAGNOSIS — C50912 Malignant neoplasm of unspecified site of left female breast: Secondary | ICD-10-CM | POA: Diagnosis not present

## 2015-05-25 DIAGNOSIS — C50912 Malignant neoplasm of unspecified site of left female breast: Secondary | ICD-10-CM | POA: Diagnosis not present

## 2015-05-25 DIAGNOSIS — M8588 Other specified disorders of bone density and structure, other site: Secondary | ICD-10-CM | POA: Diagnosis not present

## 2015-05-26 DIAGNOSIS — C7951 Secondary malignant neoplasm of bone: Secondary | ICD-10-CM | POA: Diagnosis not present

## 2015-05-26 DIAGNOSIS — Z170 Estrogen receptor positive status [ER+]: Secondary | ICD-10-CM | POA: Diagnosis not present

## 2015-05-26 DIAGNOSIS — C50919 Malignant neoplasm of unspecified site of unspecified female breast: Secondary | ICD-10-CM | POA: Diagnosis not present

## 2015-05-26 DIAGNOSIS — C50912 Malignant neoplasm of unspecified site of left female breast: Secondary | ICD-10-CM | POA: Diagnosis not present

## 2015-05-26 DIAGNOSIS — Z9221 Personal history of antineoplastic chemotherapy: Secondary | ICD-10-CM | POA: Diagnosis not present

## 2015-05-26 DIAGNOSIS — M899 Disorder of bone, unspecified: Secondary | ICD-10-CM | POA: Diagnosis not present

## 2015-05-26 DIAGNOSIS — Z853 Personal history of malignant neoplasm of breast: Secondary | ICD-10-CM | POA: Diagnosis not present

## 2015-05-26 DIAGNOSIS — Z9119 Patient's noncompliance with other medical treatment and regimen: Secondary | ICD-10-CM | POA: Diagnosis not present

## 2015-05-31 DIAGNOSIS — R0602 Shortness of breath: Secondary | ICD-10-CM | POA: Diagnosis not present

## 2015-06-03 DIAGNOSIS — C773 Secondary and unspecified malignant neoplasm of axilla and upper limb lymph nodes: Secondary | ICD-10-CM | POA: Diagnosis not present

## 2015-06-03 DIAGNOSIS — Z9889 Other specified postprocedural states: Secondary | ICD-10-CM | POA: Diagnosis not present

## 2015-06-03 DIAGNOSIS — R109 Unspecified abdominal pain: Secondary | ICD-10-CM | POA: Diagnosis not present

## 2015-06-03 DIAGNOSIS — Z90710 Acquired absence of both cervix and uterus: Secondary | ICD-10-CM | POA: Diagnosis not present

## 2015-06-03 DIAGNOSIS — C7951 Secondary malignant neoplasm of bone: Secondary | ICD-10-CM | POA: Diagnosis not present

## 2015-06-03 DIAGNOSIS — C50912 Malignant neoplasm of unspecified site of left female breast: Secondary | ICD-10-CM | POA: Diagnosis not present

## 2015-06-03 DIAGNOSIS — I10 Essential (primary) hypertension: Secondary | ICD-10-CM | POA: Diagnosis not present

## 2015-06-03 DIAGNOSIS — M549 Dorsalgia, unspecified: Secondary | ICD-10-CM | POA: Diagnosis not present

## 2015-06-03 DIAGNOSIS — C799 Secondary malignant neoplasm of unspecified site: Secondary | ICD-10-CM | POA: Diagnosis not present

## 2015-06-03 DIAGNOSIS — Z6841 Body Mass Index (BMI) 40.0 and over, adult: Secondary | ICD-10-CM | POA: Diagnosis not present

## 2015-06-03 DIAGNOSIS — C50919 Malignant neoplasm of unspecified site of unspecified female breast: Secondary | ICD-10-CM | POA: Diagnosis not present

## 2015-06-03 DIAGNOSIS — Z9049 Acquired absence of other specified parts of digestive tract: Secondary | ICD-10-CM | POA: Diagnosis not present

## 2015-06-03 DIAGNOSIS — D649 Anemia, unspecified: Secondary | ICD-10-CM | POA: Diagnosis not present

## 2015-06-03 DIAGNOSIS — R10819 Abdominal tenderness, unspecified site: Secondary | ICD-10-CM | POA: Diagnosis not present

## 2015-06-03 DIAGNOSIS — Z7901 Long term (current) use of anticoagulants: Secondary | ICD-10-CM | POA: Diagnosis not present

## 2015-06-03 DIAGNOSIS — Z87891 Personal history of nicotine dependence: Secondary | ICD-10-CM | POA: Diagnosis not present

## 2015-06-04 DIAGNOSIS — G893 Neoplasm related pain (acute) (chronic): Secondary | ICD-10-CM | POA: Insufficient documentation

## 2015-06-04 DIAGNOSIS — Z515 Encounter for palliative care: Secondary | ICD-10-CM | POA: Diagnosis present

## 2015-06-04 DIAGNOSIS — Z803 Family history of malignant neoplasm of breast: Secondary | ICD-10-CM | POA: Diagnosis not present

## 2015-06-04 DIAGNOSIS — K5903 Drug induced constipation: Secondary | ICD-10-CM | POA: Diagnosis present

## 2015-06-04 DIAGNOSIS — Z800 Family history of malignant neoplasm of digestive organs: Secondary | ICD-10-CM | POA: Diagnosis not present

## 2015-06-04 DIAGNOSIS — Z7901 Long term (current) use of anticoagulants: Secondary | ICD-10-CM | POA: Diagnosis not present

## 2015-06-04 DIAGNOSIS — Z6841 Body Mass Index (BMI) 40.0 and over, adult: Secondary | ICD-10-CM | POA: Diagnosis not present

## 2015-06-04 DIAGNOSIS — C50919 Malignant neoplasm of unspecified site of unspecified female breast: Secondary | ICD-10-CM | POA: Diagnosis not present

## 2015-06-04 DIAGNOSIS — Z86711 Personal history of pulmonary embolism: Secondary | ICD-10-CM | POA: Diagnosis not present

## 2015-06-04 DIAGNOSIS — E669 Obesity, unspecified: Secondary | ICD-10-CM | POA: Diagnosis present

## 2015-06-04 DIAGNOSIS — C773 Secondary and unspecified malignant neoplasm of axilla and upper limb lymph nodes: Secondary | ICD-10-CM | POA: Diagnosis present

## 2015-06-04 DIAGNOSIS — Z170 Estrogen receptor positive status [ER+]: Secondary | ICD-10-CM | POA: Diagnosis not present

## 2015-06-04 DIAGNOSIS — Z87891 Personal history of nicotine dependence: Secondary | ICD-10-CM | POA: Diagnosis not present

## 2015-06-04 DIAGNOSIS — R109 Unspecified abdominal pain: Secondary | ICD-10-CM | POA: Diagnosis not present

## 2015-06-04 DIAGNOSIS — C50912 Malignant neoplasm of unspecified site of left female breast: Secondary | ICD-10-CM | POA: Diagnosis present

## 2015-06-04 DIAGNOSIS — T402X5A Adverse effect of other opioids, initial encounter: Secondary | ICD-10-CM | POA: Diagnosis present

## 2015-06-04 DIAGNOSIS — D649 Anemia, unspecified: Secondary | ICD-10-CM | POA: Diagnosis present

## 2015-06-04 DIAGNOSIS — M25551 Pain in right hip: Secondary | ICD-10-CM | POA: Diagnosis not present

## 2015-06-04 DIAGNOSIS — Z7189 Other specified counseling: Secondary | ICD-10-CM | POA: Diagnosis not present

## 2015-06-04 DIAGNOSIS — C7951 Secondary malignant neoplasm of bone: Secondary | ICD-10-CM | POA: Diagnosis present

## 2015-06-04 DIAGNOSIS — K089 Disorder of teeth and supporting structures, unspecified: Secondary | ICD-10-CM | POA: Diagnosis present

## 2015-06-04 DIAGNOSIS — I10 Essential (primary) hypertension: Secondary | ICD-10-CM | POA: Diagnosis present

## 2015-06-14 DIAGNOSIS — C7951 Secondary malignant neoplasm of bone: Secondary | ICD-10-CM | POA: Diagnosis not present

## 2015-06-17 DIAGNOSIS — C7951 Secondary malignant neoplasm of bone: Secondary | ICD-10-CM | POA: Diagnosis not present

## 2015-06-22 DIAGNOSIS — C50919 Malignant neoplasm of unspecified site of unspecified female breast: Secondary | ICD-10-CM | POA: Diagnosis not present

## 2015-06-22 DIAGNOSIS — C7951 Secondary malignant neoplasm of bone: Secondary | ICD-10-CM | POA: Diagnosis not present

## 2015-06-22 DIAGNOSIS — C799 Secondary malignant neoplasm of unspecified site: Secondary | ICD-10-CM | POA: Diagnosis not present

## 2015-06-23 DIAGNOSIS — C7951 Secondary malignant neoplasm of bone: Secondary | ICD-10-CM | POA: Diagnosis not present

## 2015-06-24 DIAGNOSIS — C7951 Secondary malignant neoplasm of bone: Secondary | ICD-10-CM | POA: Diagnosis not present

## 2015-06-24 DIAGNOSIS — R0789 Other chest pain: Secondary | ICD-10-CM | POA: Diagnosis not present

## 2015-06-27 DIAGNOSIS — C7951 Secondary malignant neoplasm of bone: Secondary | ICD-10-CM | POA: Diagnosis not present

## 2015-06-28 DIAGNOSIS — C50919 Malignant neoplasm of unspecified site of unspecified female breast: Secondary | ICD-10-CM | POA: Diagnosis not present

## 2015-06-29 DIAGNOSIS — C7951 Secondary malignant neoplasm of bone: Secondary | ICD-10-CM | POA: Diagnosis not present

## 2015-07-01 DIAGNOSIS — Z6841 Body Mass Index (BMI) 40.0 and over, adult: Secondary | ICD-10-CM | POA: Diagnosis not present

## 2015-07-01 DIAGNOSIS — R0602 Shortness of breath: Secondary | ICD-10-CM | POA: Diagnosis not present

## 2015-07-01 DIAGNOSIS — Z87891 Personal history of nicotine dependence: Secondary | ICD-10-CM | POA: Diagnosis not present

## 2015-07-01 DIAGNOSIS — K5900 Constipation, unspecified: Secondary | ICD-10-CM | POA: Diagnosis not present

## 2015-07-01 DIAGNOSIS — G4733 Obstructive sleep apnea (adult) (pediatric): Secondary | ICD-10-CM | POA: Diagnosis not present

## 2015-07-01 DIAGNOSIS — C7951 Secondary malignant neoplasm of bone: Secondary | ICD-10-CM | POA: Diagnosis not present

## 2015-07-01 DIAGNOSIS — R42 Dizziness and giddiness: Secondary | ICD-10-CM | POA: Diagnosis not present

## 2015-07-01 DIAGNOSIS — N3000 Acute cystitis without hematuria: Secondary | ICD-10-CM | POA: Diagnosis not present

## 2015-07-01 DIAGNOSIS — I2699 Other pulmonary embolism without acute cor pulmonale: Secondary | ICD-10-CM | POA: Diagnosis not present

## 2015-07-01 DIAGNOSIS — R05 Cough: Secondary | ICD-10-CM | POA: Diagnosis not present

## 2015-07-01 DIAGNOSIS — C50919 Malignant neoplasm of unspecified site of unspecified female breast: Secondary | ICD-10-CM | POA: Diagnosis not present

## 2015-07-01 DIAGNOSIS — E669 Obesity, unspecified: Secondary | ICD-10-CM | POA: Diagnosis not present

## 2015-07-01 DIAGNOSIS — R072 Precordial pain: Secondary | ICD-10-CM | POA: Diagnosis not present

## 2015-07-04 DIAGNOSIS — C7951 Secondary malignant neoplasm of bone: Secondary | ICD-10-CM | POA: Diagnosis not present

## 2015-07-06 DIAGNOSIS — C50919 Malignant neoplasm of unspecified site of unspecified female breast: Secondary | ICD-10-CM | POA: Diagnosis not present

## 2015-07-06 DIAGNOSIS — C799 Secondary malignant neoplasm of unspecified site: Secondary | ICD-10-CM | POA: Diagnosis not present

## 2015-07-14 DIAGNOSIS — C799 Secondary malignant neoplasm of unspecified site: Secondary | ICD-10-CM | POA: Diagnosis not present

## 2015-07-14 DIAGNOSIS — C50912 Malignant neoplasm of unspecified site of left female breast: Secondary | ICD-10-CM | POA: Diagnosis not present

## 2015-07-26 DIAGNOSIS — C50919 Malignant neoplasm of unspecified site of unspecified female breast: Secondary | ICD-10-CM | POA: Diagnosis not present

## 2015-07-27 DIAGNOSIS — R42 Dizziness and giddiness: Secondary | ICD-10-CM | POA: Diagnosis not present

## 2015-07-27 DIAGNOSIS — C50919 Malignant neoplasm of unspecified site of unspecified female breast: Secondary | ICD-10-CM | POA: Diagnosis not present

## 2015-07-27 DIAGNOSIS — C799 Secondary malignant neoplasm of unspecified site: Secondary | ICD-10-CM | POA: Diagnosis not present

## 2015-07-30 DIAGNOSIS — F17210 Nicotine dependence, cigarettes, uncomplicated: Secondary | ICD-10-CM | POA: Diagnosis not present

## 2015-07-30 DIAGNOSIS — R0602 Shortness of breath: Secondary | ICD-10-CM | POA: Diagnosis not present

## 2015-07-30 DIAGNOSIS — I10 Essential (primary) hypertension: Secondary | ICD-10-CM | POA: Diagnosis not present

## 2015-07-30 DIAGNOSIS — G4733 Obstructive sleep apnea (adult) (pediatric): Secondary | ICD-10-CM | POA: Diagnosis not present

## 2015-07-30 DIAGNOSIS — G893 Neoplasm related pain (acute) (chronic): Secondary | ICD-10-CM | POA: Diagnosis not present

## 2015-07-30 DIAGNOSIS — I2699 Other pulmonary embolism without acute cor pulmonale: Secondary | ICD-10-CM | POA: Diagnosis not present

## 2015-07-30 DIAGNOSIS — Z853 Personal history of malignant neoplasm of breast: Secondary | ICD-10-CM | POA: Diagnosis not present

## 2015-08-13 DIAGNOSIS — R0602 Shortness of breath: Secondary | ICD-10-CM | POA: Diagnosis not present

## 2015-08-13 DIAGNOSIS — R002 Palpitations: Secondary | ICD-10-CM | POA: Diagnosis not present

## 2015-08-13 DIAGNOSIS — Z6841 Body Mass Index (BMI) 40.0 and over, adult: Secondary | ICD-10-CM | POA: Diagnosis not present

## 2015-08-13 DIAGNOSIS — I10 Essential (primary) hypertension: Secondary | ICD-10-CM | POA: Diagnosis not present

## 2015-08-13 DIAGNOSIS — R911 Solitary pulmonary nodule: Secondary | ICD-10-CM | POA: Diagnosis not present

## 2015-08-13 DIAGNOSIS — I9789 Other postprocedural complications and disorders of the circulatory system, not elsewhere classified: Secondary | ICD-10-CM | POA: Diagnosis not present

## 2015-08-13 DIAGNOSIS — D61810 Antineoplastic chemotherapy induced pancytopenia: Secondary | ICD-10-CM | POA: Diagnosis not present

## 2015-08-13 DIAGNOSIS — E119 Type 2 diabetes mellitus without complications: Secondary | ICD-10-CM | POA: Diagnosis not present

## 2015-08-13 DIAGNOSIS — Z87891 Personal history of nicotine dependence: Secondary | ICD-10-CM | POA: Diagnosis not present

## 2015-08-13 DIAGNOSIS — J4521 Mild intermittent asthma with (acute) exacerbation: Secondary | ICD-10-CM | POA: Diagnosis not present

## 2015-08-13 DIAGNOSIS — R0600 Dyspnea, unspecified: Secondary | ICD-10-CM | POA: Diagnosis not present

## 2015-08-13 DIAGNOSIS — C7951 Secondary malignant neoplasm of bone: Secondary | ICD-10-CM | POA: Diagnosis not present

## 2015-08-14 DIAGNOSIS — R5081 Fever presenting with conditions classified elsewhere: Secondary | ICD-10-CM | POA: Diagnosis not present

## 2015-08-14 DIAGNOSIS — D61810 Antineoplastic chemotherapy induced pancytopenia: Secondary | ICD-10-CM | POA: Diagnosis present

## 2015-08-14 DIAGNOSIS — R002 Palpitations: Secondary | ICD-10-CM | POA: Diagnosis present

## 2015-08-14 DIAGNOSIS — I10 Essential (primary) hypertension: Secondary | ICD-10-CM | POA: Diagnosis present

## 2015-08-14 DIAGNOSIS — E119 Type 2 diabetes mellitus without complications: Secondary | ICD-10-CM | POA: Diagnosis present

## 2015-08-14 DIAGNOSIS — C50919 Malignant neoplasm of unspecified site of unspecified female breast: Secondary | ICD-10-CM | POA: Diagnosis not present

## 2015-08-14 DIAGNOSIS — E785 Hyperlipidemia, unspecified: Secondary | ICD-10-CM | POA: Diagnosis present

## 2015-08-14 DIAGNOSIS — D709 Neutropenia, unspecified: Secondary | ICD-10-CM | POA: Diagnosis not present

## 2015-08-14 DIAGNOSIS — R0600 Dyspnea, unspecified: Secondary | ICD-10-CM | POA: Diagnosis not present

## 2015-08-14 DIAGNOSIS — R0609 Other forms of dyspnea: Secondary | ICD-10-CM | POA: Diagnosis not present

## 2015-08-14 DIAGNOSIS — M898X8 Other specified disorders of bone, other site: Secondary | ICD-10-CM | POA: Diagnosis not present

## 2015-08-14 DIAGNOSIS — Z853 Personal history of malignant neoplasm of breast: Secondary | ICD-10-CM | POA: Diagnosis not present

## 2015-08-14 DIAGNOSIS — R51 Headache: Secondary | ICD-10-CM | POA: Diagnosis not present

## 2015-08-14 DIAGNOSIS — K219 Gastro-esophageal reflux disease without esophagitis: Secondary | ICD-10-CM | POA: Diagnosis present

## 2015-08-14 DIAGNOSIS — F419 Anxiety disorder, unspecified: Secondary | ICD-10-CM | POA: Diagnosis present

## 2015-08-14 DIAGNOSIS — M868X5 Other osteomyelitis, thigh: Secondary | ICD-10-CM | POA: Diagnosis not present

## 2015-08-14 DIAGNOSIS — M1710 Unilateral primary osteoarthritis, unspecified knee: Secondary | ICD-10-CM | POA: Diagnosis present

## 2015-08-14 DIAGNOSIS — C7951 Secondary malignant neoplasm of bone: Secondary | ICD-10-CM | POA: Diagnosis present

## 2015-08-14 DIAGNOSIS — J3489 Other specified disorders of nose and nasal sinuses: Secondary | ICD-10-CM | POA: Diagnosis not present

## 2015-08-14 DIAGNOSIS — N281 Cyst of kidney, acquired: Secondary | ICD-10-CM | POA: Diagnosis not present

## 2015-08-14 DIAGNOSIS — R0602 Shortness of breath: Secondary | ICD-10-CM | POA: Diagnosis not present

## 2015-08-14 DIAGNOSIS — Z86711 Personal history of pulmonary embolism: Secondary | ICD-10-CM | POA: Diagnosis not present

## 2015-08-14 DIAGNOSIS — G4733 Obstructive sleep apnea (adult) (pediatric): Secondary | ICD-10-CM | POA: Diagnosis present

## 2015-08-14 DIAGNOSIS — R10816 Epigastric abdominal tenderness: Secondary | ICD-10-CM | POA: Diagnosis not present

## 2015-08-14 DIAGNOSIS — Z87891 Personal history of nicotine dependence: Secondary | ICD-10-CM | POA: Diagnosis not present

## 2015-08-14 DIAGNOSIS — R110 Nausea: Secondary | ICD-10-CM | POA: Diagnosis not present

## 2015-08-14 DIAGNOSIS — Z5111 Encounter for antineoplastic chemotherapy: Secondary | ICD-10-CM | POA: Diagnosis not present

## 2015-08-14 DIAGNOSIS — C50912 Malignant neoplasm of unspecified site of left female breast: Secondary | ICD-10-CM | POA: Diagnosis not present

## 2015-08-14 DIAGNOSIS — T451X5A Adverse effect of antineoplastic and immunosuppressive drugs, initial encounter: Secondary | ICD-10-CM | POA: Diagnosis present

## 2015-08-14 DIAGNOSIS — Z6841 Body Mass Index (BMI) 40.0 and over, adult: Secondary | ICD-10-CM | POA: Diagnosis not present

## 2015-08-14 DIAGNOSIS — C799 Secondary malignant neoplasm of unspecified site: Secondary | ICD-10-CM | POA: Diagnosis not present

## 2015-08-14 DIAGNOSIS — R509 Fever, unspecified: Secondary | ICD-10-CM | POA: Diagnosis not present

## 2015-08-14 DIAGNOSIS — J4521 Mild intermittent asthma with (acute) exacerbation: Secondary | ICD-10-CM | POA: Diagnosis present

## 2015-08-14 DIAGNOSIS — G4489 Other headache syndrome: Secondary | ICD-10-CM | POA: Diagnosis not present

## 2015-08-14 DIAGNOSIS — J45901 Unspecified asthma with (acute) exacerbation: Secondary | ICD-10-CM | POA: Diagnosis not present

## 2015-08-14 DIAGNOSIS — H269 Unspecified cataract: Secondary | ICD-10-CM | POA: Diagnosis present

## 2015-08-23 ENCOUNTER — Encounter: Payer: Medicare Other | Admitting: Nurse Practitioner

## 2015-08-24 ENCOUNTER — Telehealth: Payer: Self-pay | Admitting: *Deleted

## 2015-08-24 NOTE — Telephone Encounter (Signed)
TC to pt about missed Survivorship appt LM for pt to rtn call to reschedule. POF sent to schedulers.

## 2015-08-24 NOTE — Telephone Encounter (Signed)
Spoke with patient to try and r/s missed survivorship appt. Pt stated that she was in the hospital out of town and did not want to r/s appt.

## 2015-08-26 DIAGNOSIS — H31003 Unspecified chorioretinal scars, bilateral: Secondary | ICD-10-CM | POA: Diagnosis not present

## 2015-08-26 DIAGNOSIS — H40023 Open angle with borderline findings, high risk, bilateral: Secondary | ICD-10-CM | POA: Diagnosis not present

## 2015-08-26 DIAGNOSIS — Q142 Congenital malformation of optic disc: Secondary | ICD-10-CM | POA: Diagnosis not present

## 2015-08-30 DIAGNOSIS — C50919 Malignant neoplasm of unspecified site of unspecified female breast: Secondary | ICD-10-CM | POA: Diagnosis not present

## 2015-08-31 DIAGNOSIS — C799 Secondary malignant neoplasm of unspecified site: Secondary | ICD-10-CM | POA: Diagnosis not present

## 2015-08-31 DIAGNOSIS — C50919 Malignant neoplasm of unspecified site of unspecified female breast: Secondary | ICD-10-CM | POA: Diagnosis not present

## 2015-09-12 ENCOUNTER — Encounter: Payer: Self-pay | Admitting: Nurse Practitioner

## 2015-09-12 DIAGNOSIS — C50112 Malignant neoplasm of central portion of left female breast: Secondary | ICD-10-CM

## 2015-09-12 NOTE — Progress Notes (Signed)
A letter was mailed to Michaela Little at her home address on this date following her having missed an appointment in the Survivorship clinic for ongoing surveillance and continued follow up with no return of message left requesting call to reschedule appointment.  This letter stressed the importance of these visits and encouraged [name] to call [phone] and reschedule her follow up appointment either with [place] in Survivorship or her medical oncologist's, Dr. Virgie Dad, office based on her preference.  We will not be calling to make additional appointments, but will await her rescheduling.

## 2015-09-13 DIAGNOSIS — I2699 Other pulmonary embolism without acute cor pulmonale: Secondary | ICD-10-CM | POA: Diagnosis not present

## 2015-09-13 DIAGNOSIS — C799 Secondary malignant neoplasm of unspecified site: Secondary | ICD-10-CM | POA: Diagnosis not present

## 2015-09-13 DIAGNOSIS — R079 Chest pain, unspecified: Secondary | ICD-10-CM | POA: Diagnosis not present

## 2015-09-13 DIAGNOSIS — R0600 Dyspnea, unspecified: Secondary | ICD-10-CM | POA: Diagnosis not present

## 2015-09-13 DIAGNOSIS — I10 Essential (primary) hypertension: Secondary | ICD-10-CM | POA: Diagnosis not present

## 2015-09-13 DIAGNOSIS — C50919 Malignant neoplasm of unspecified site of unspecified female breast: Secondary | ICD-10-CM | POA: Diagnosis not present

## 2015-09-13 DIAGNOSIS — R002 Palpitations: Secondary | ICD-10-CM | POA: Diagnosis not present

## 2015-09-13 DIAGNOSIS — R0602 Shortness of breath: Secondary | ICD-10-CM | POA: Diagnosis not present

## 2015-09-13 DIAGNOSIS — Z1389 Encounter for screening for other disorder: Secondary | ICD-10-CM | POA: Diagnosis not present

## 2015-09-21 DIAGNOSIS — M79676 Pain in unspecified toe(s): Secondary | ICD-10-CM | POA: Diagnosis not present

## 2015-09-21 DIAGNOSIS — B353 Tinea pedis: Secondary | ICD-10-CM | POA: Diagnosis not present

## 2015-09-21 DIAGNOSIS — B351 Tinea unguium: Secondary | ICD-10-CM | POA: Diagnosis not present

## 2015-09-26 DIAGNOSIS — C50919 Malignant neoplasm of unspecified site of unspecified female breast: Secondary | ICD-10-CM | POA: Diagnosis not present

## 2015-10-04 DIAGNOSIS — C50919 Malignant neoplasm of unspecified site of unspecified female breast: Secondary | ICD-10-CM | POA: Diagnosis not present

## 2015-10-04 DIAGNOSIS — C799 Secondary malignant neoplasm of unspecified site: Secondary | ICD-10-CM | POA: Diagnosis not present

## 2015-10-04 DIAGNOSIS — D701 Agranulocytosis secondary to cancer chemotherapy: Secondary | ICD-10-CM | POA: Diagnosis not present

## 2015-10-10 ENCOUNTER — Telehealth: Payer: Self-pay

## 2015-10-10 NOTE — Telephone Encounter (Signed)
Pt called with garbled message d/t poor phone connection. Called pt back. Pt got sick and went to ER in [place], they said her cancer was back. Dr in [place] put her on ibrance and letrozole. She is now back in [place].  [facility] hospital, [facility] and Dr oh sung young. They should already have sent the records.

## 2015-10-11 NOTE — Telephone Encounter (Signed)
In Box message sent to scheduling for appointment

## 2015-10-11 NOTE — Telephone Encounter (Signed)
This RN spoke with pt per call- verified start day for Ibrance as 09/22/2015 with pt completing cycle this week. She states " the oncology MD in [place] is mailing me my next bottle of Ibrance but I will not start it until next week.  This RN informed pt goal is to see the patient on Monday 7/24 - and she is not to start until she see MD and labs are reviewed.  Michaela Little verbalized understanding of above- In Box message sent to scheduling.

## 2015-10-14 ENCOUNTER — Other Ambulatory Visit: Payer: Self-pay | Admitting: *Deleted

## 2015-10-14 DIAGNOSIS — C50812 Malignant neoplasm of overlapping sites of left female breast: Secondary | ICD-10-CM

## 2015-10-16 NOTE — Progress Notes (Signed)
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  The patient had left breast and left axillary lymph node biopsy performed March of 2010, both positive (I do not have the pathology report at this point). She was treated neo-adjuvantly at [facility] with (a) paclitaxel weekly x12 and (b) doxorubicin/cyclophosphamide in dose dense fashion x4, both given with tifiparnib.   She then proceeded to left modified radical mastectomy 12/23/2008, the final pathology showing a 4 cm residual invasive lobular carcinoma, involving 5/22 lymph nodes sampled. The tumor was estrogen and progesterone receptor positive, HER-2 negative.   Postoperatively she received 50 gray of radiation completed January of 2011, including the left supraclavicular lymph node basin. She was then started on tamoxifen, but developed some uterine lining thickening and was switched to anastrozole in May 2012. Her subsequent history is as detailed below  INTERVAL HISTORY: Michaela Little returns today for follow-up of her breast cancer. I last saw her here 04/26/2015 and at that time she was released from follow-up, as she was moving to [facility]. However, in February 2017 she presented to [facility] hospital's emergency department complaining of right rib and right hip pain. CT scans and subsequently a PET scan were obtained and showed multiple FDG avid bony lesions in the thoracolumbar spine and pelvis right proximal femur right eighth rib, sternum and  scapula. There was no visceral disease. MRI of the brain showed extensive calvarial enhancement but no focal lesions. On 05/26/2015 an iliac bone biopsy showed metastatic carcinoma consistent with a breast primary, estrogen receptor 80% positive, progesterone receptor less than 1% positive, and HER-2 not amplified by immunohistochemistry.  The patient was referred to radiation oncology. On 06/04/2015 a CT of the abdomen and pelvis without contrast was obtained again showing diffuse bony metastatic disease but no acute intra-abdominal findings. There was no hydronephrosis. Chest x-ray was obtained 06/03/2015 which showed clear lungs.  The patient received radiation to the right rib cage and right hip area between 06/24/2015 and 06/27/2015. This significantly improved the patient's pain. She was started on letrozole and palbociclib in March 2017, with her palbociclib dose dropped from 125 to 100 mg because of cytopenias. She is currently in the "off" week of the current cycle  In addition, a CT scan obtained here 04/19/2015 states "central pulmonary embolism." This was not mentioned in the impression and I suspect it really means "no central pulmonary embolism" but I will need to confirm that with radiology. In [place] however this was accepted as evidence for pulmonary embolus and the patient has been treated with enoxaparin 120 mg twice a day since February 2017.  Michaela Little was understandably frustrated and angry that I had not read that comment in the report. It is very positive of her to come and discuss it with me so that we can clarify it . She is planning to be in this area for some time and is coming back under our care  REVIEW OF SYSTEMS: Michaela Little's pain is now much better controlled. She tells me she did not  tolerate narcotics. She is currently on no pain medicine other than occasional Advil. She has a little bit of a runny nose and some sinus problems, with a dry cough. She can be short of breath  particularly when walking upstairs. She has occasional urinary symptoms but not currently. She has occasional headaches but again not more persistent or intense than prior. She is experiencing no bleeding from the Lovenox. A detailed review of systems today was otherwise stable.  PAST MEDICAL HISTORY: Past Medical History:  Diagnosis Date  . Anxiety   . Arthritis    hands, knees, hips  . Asthma   . Cancer ([place])   . Cervical stenosis (uterine cervix)   . Dyspnea on exertion   . GERD (gastroesophageal reflux disease)   . Headache(784.0)   . History of breast cancer    2010--  LEFT  s/p  mastectomy (in [place]) AND CHEMORADIATION--  NO RECURRENCE  . History of cervical dysplasia   . History of TB skin testing    [place] TEEN--  [place]/ MEDS  . Hyperlipidemia   . Hypertension   . OSA (obstructive sleep apnea) moderate osa per study  09/2010   CPAP  , NOT USING ON REGULAR BASIS  . Pelvic pain in female   . Positive H. pylori test    08-05-2013  . Refusal of blood transfusions as patient is Jehovah's Witness   . Seasonal allergies   . Uterine fibroid   . Wears glasses     PAST SURGICAL HISTORY: Past Surgical History:  Procedure Laterality Date  . CARDIOVASCULAR STRESS TEST  10-29-2012   low risk perfusion study/  no significant reversibity/ ef 66%/  normal wall motion  . CERVICAL CONIZATION W/BX  2012   in  [place] N/A 05/14/2012   Procedure: LAPAROSCOPIC CHOLECYSTECTOMY;  Surgeon: Ralene Ok, MD;  Location: [facility];  Service: General;  Laterality: N/A;  . COLONOSCOPY  2011   normal per patient - [place]  . DILATION AND CURETTAGE OF UTERUS  10/15/2011   Procedure: DILATATION AND CURETTAGE;  Surgeon: Melina Schools, MD;  Location: [facility];  Service: Gynecology;  Laterality: N/A;  Conization &  endocervical curettings  . EXAMINATION UNDER ANESTHESIA N/A 08/20/2013   Procedure: EXAM UNDER ANESTHESIA;  Surgeon: Margarette Asal, MD;  Location: [facility];  Service:  Gynecology;  Laterality: N/A;  . [place]   right  . LAPAROSCOPIC ASSISTED VAGINAL HYSTERECTOMY N/A 10/26/2014   Procedure: HYSTERECTOMY ABDOMINAL ;  Surgeon: Molli Posey, MD;  Location: [facility];  Service: Gynecology;  Laterality: N/A;  . MASTECTOMY Left 11/2008  in [place]   . SALPINGOOPHORECTOMY Bilateral 10/26/2014   Procedure: BILATERAL SALPINGO OOPHORECTOMY;  Surgeon: Molli Posey, MD;  Location: [facility];  Service: Gynecology;  Laterality: Bilateral;  . TRANSTHORACIC ECHOCARDIOGRAM  10-29-2012   mild lvh/  ef 60-65%/  grade II diastolic dysfunction/  trivial mr  &  tr    FAMILY HISTORY Family History  Problem Relation Age of Onset  . Breast cancer Mother   . Colon cancer Mother   . Hypotension Mother   . Asthma Mother   . Diabetes type II Mother   . Arthritis Mother   . Clotting disorder Mother   . Cancer Mother     breast/colon  . Mental illness Brother   . Heart disease Brother   . Cerebral palsy Daughter   . Emphysema Brother  never smoker  . Colon cancer Maternal Aunt   . Cancer Maternal Aunt     colon  . Colon cancer Maternal Uncle   . Cancer Maternal Uncle     colon  . Esophageal cancer Neg Hx   . Rectal cancer Neg Hx   . Stomach cancer Neg Hx   . Thyroid disease Neg Hx    Gynecologic history: She is GX P5. At first pregnancy to term age 66. Menarche age 26. Last menstrual period when she had her chemotherapy.   Social History: She is disabled secondary to arthritis. She moved from [place] to [place] for "peace of mind". Of her 5 children only one daughter is living in [place], [place]. One daughter in [place] is incarcerated and another one is "in a lot of stress". The patient lives alone. She mostly reads and does computer games on her phone during the day. She has 20 grandchildren.    ADVANCED DIRECTIVES: She does not have a healthcare power of attorney.    HEALTH MAINTENANCE: Social History  Substance Use  Topics  . Smoking status: Former Smoker    Packs/day: 0.30    Years: 10.00    Types: Cigarettes    Quit date: 03/26/1988  . Smokeless tobacco: Never Used  . Alcohol use No     Allergies  Allergen Reactions  . Adhesive [Tape] Itching and Other (See Comments)    REDDNESS  . Calcium Carbonate Antacid Hives    Fruit flavored Tums   . Hctz [Hydrochlorothiazide] Itching  . Latex Itching  . Lisinopril Itching  . Losartan Potassium Other (See Comments)    Makes her feel "bad"    Current Outpatient Prescriptions  Medication Sig Dispense Refill  . enoxaparin (LOVENOX) 120 MG/0.8ML injection Inject 120 mg into the skin 2 (two) times daily.    Marland Kitchen loratadine (CLARITIN) 10 MG tablet Take 10 mg by mouth daily.    . metoprolol (LOPRESSOR) 50 MG tablet Take 50 mg by mouth 2 (two) times daily.    . palbociclib (IBRANCE) 100 MG capsule Take 100 mg by mouth daily with breakfast. Take whole with food.    Marland Kitchen albuterol (VENTOLIN HFA) 108 (90 BASE) MCG/ACT inhaler Inhale 2 puffs into the lungs every 4 (four) hours as needed for wheezing or shortness of breath.     . docusate sodium (COLACE) 100 MG capsule Take 1 capsule (100 mg total) by mouth every 12 (twelve) hours. 60 capsule 0  . ibuprofen (ADVIL,MOTRIN) 800 MG tablet Take 1 tablet (800 mg total) by mouth every 8 (eight) hours as needed for moderate pain (mild pain). 30 tablet 1  . letrozole (FEMARA) 2.5 MG tablet Take 1 tablet (2.5 mg total) by mouth daily. 90 tablet 4   No current facility-administered medications for this visit.     OBJECTIVE: Middle-aged [place] American Little in no acute distress Vitals:   10/17/15 1548  BP: (!) 142/77  Pulse: 74  Resp: 18  Temp: 98.6 F (37 C)     Body mass index is 41.54 kg/m.    ECOG FS: 1 Filed Weights   10/17/15 1548  Weight: 265 lb 3.2 oz (120.3 kg)   Sclerae unicteric, pupils round and equal Oropharynx clear and moist-- no thrush or other lesions No cervical or supraclavicular  adenopathy Lungs no rales or rhonchi Heart regular rate and rhythm Abd soft, nontender, positive bowel sounds MSK no focal spinal tenderness, no upper extremity lymphedema Neuro: nonfocal, well oriented, appropriate affect Breasts:  Deferred  LAB RESULTS: Lab Results  Component Value Date   WBC 2.1 (L) 10/17/2015   NEUTROABS 1.1 (L) 10/17/2015   HGB 11.4 (L) 10/17/2015   HCT 35.1 10/17/2015   MCV 87.8 10/17/2015   PLT 221 10/17/2015      Chemistry      Component Value Date/Time   NA 140 10/17/2015 1517   K 4.3 10/17/2015 1517   CL 105 04/17/2015 2041   CL 105 07/07/2012 1327   CO2 26 10/17/2015 1517   BUN 12.5 10/17/2015 1517   CREATININE 0.9 10/17/2015 1517      Component Value Date/Time   CALCIUM 9.2 10/17/2015 1517   ALKPHOS 112 10/17/2015 1517   AST 13 10/17/2015 1517   ALT 11 10/17/2015 1517   BILITOT 0.65 10/17/2015 1517       Lab Results  Component Value Date   LABCA2 19 01/17/2012     STUDIES: Outside records reviewed  IMPRESSION: 55 y.o.  Michaela Little relocated here from [place],    (1) breast cancer of left breast overlapping sites diagnosed March of 2010 and treated neo-adjuvantly at [facility] with (a) paclitaxel weekly x12 and (b) doxorubicin/ cyclophosphamide in dose dense fashion x4, both given with tifiparnib.   (2) definitive left modified radical mastectomy September of 2010 for a T2 N2 or stage IIIA invasive lobular breast cancer, estrogen and progesterone receptor positive, HER-2 negative,   (3)  post mastectomy radiation completed January of 2011,  (4) briefly on tamoxifen, discontinued it because of uterine lining concerns.  On anastrozole since May 2012 with good tolerance. Held between November and December 2013 due to GI issues, unrelated to cancer or its treatment.  Discontinued by the patient summer of 2016  (5)  Other problems include left upper extremity lymphedema, borderline concern regarding genetic family history,  history of cervical dysplasia, and history of dense breasts.  METASTATIC DISEASE: (6) iliac bone biopsy (laterality?) 05/26/2015 showed metastatic carcinoma consistent with a breast primary, estrogen receptor positive, progesterone receptor and HER-2 negative  (a) extensive staging studies including CT scans of the chest abdomen and pelvis, MRI of the brain, and a PET scan February-March 2017 in [place] showed no evidence of visceral disease   (7) palliative radiation to the right 8th rib lesion (single 8 gray dose) and right hip (20 gray in 5 fractions) given 06/24/2015 through 06/27/2015 at [facility]  (8) letrozole and palbociclib started March 2017  (a) palbociclib dose decreased to 100 mg daily (21/7) because of cytopenias  (9) enoxaparin started February 2017 for reading of "central pulmonary embolus" on CT scan of the chest 04/19/2015  PLAN: Michaela Little now has metastatic disease and we spent the better part of today's 45 minute visit reviewing what that means. We discussed the fact that stage IV breast cancer is not curable with our current knowledge base. The goal of treatment is control. The strategy of treatment is to do only the minimum necessary to control the growth of the tumor so that the patient can have as normal a life as possible. There is no survival advantage in treating aggressively if treating less aggressively results in tumor control. With this strategy stage IV breast cancer in many cases can function as a "chronic illness": something that cannot be quite gotten rid of but can be controlled for an indefinite period of time  There are always 3 questions to go over and so we reviewed those today. The first question is do we treat or not.  In some cases the patient's overall situation is so discouraging that palliative/comfort care alone is appropriate. If the decision is made to treat, then the next question is whether anti-estrogens or chemotherapy is more appropriate.  Once that decision is made than the third question is: Which agent or combination of agents in particular should be used?  Michaela Little clearly warranted treatment and it was sensible to start her on anti-estrogens rather than chemotherapy given the absence of visceral disease. Her current treatment is optional and has a high chance of controlling the disease sometimes for a considerable time. Furthermore she is tolerating it well.  She is obtaining the palbociclib through [facility]. I have renewed her letrozole dose here and she understands I will be glad to also order palbociclib since she will be followed here at least for the next several months.  I am going to go ahead and restage her with a CT of the chest and a bone scan which will serve as a new baseline. She also needs to start denosumab and this will be initiated at the next visit.  We also discussed the CT scan of the chest that she had shortly before seeing me in January. I apologized to her for not having read that in the body of the report when I saw her in January. However, the impression does not mention a pulmonary embolus much less a central pulmonary embolus, which would be a life-threatening emergency. I suspect the actual dictation was "no central pulmonary embolus."  I will review that with radiology tomorrow and I will further clarifiy that with Michaela Little when she returns to see me August 18.    Fernand Sorbello C    10/17/2015  ADDENDUM: I discussed the January CT scan of the chest with Dr. Ilda Foil. He reviewed that and confirmed that there was no pulmonary embolus. As stated, if there had been a central pulmonary embolus not only would he have placed that information in the impression but would've called me. This was just a "dragon error", and I should've picked it up at the time when I saw the patient in January, but I did not readt the body of the report at the time, only the impression  Dr. Ilda Foil will bring this up with  radiology peer review to discuss possible ways to avoid similar situations in the future. I called the patient today, apologized on our behalf, and asked her to stop her lovenox.   She is already scheduled to see me again in 11/11/2015 after her restaging studies.She knows to call for any other problems that may develop before her next visit here  Yohanna Tow C 10/18/2015

## 2015-10-17 ENCOUNTER — Ambulatory Visit (HOSPITAL_BASED_OUTPATIENT_CLINIC_OR_DEPARTMENT_OTHER): Payer: Medicare Other | Admitting: Oncology

## 2015-10-17 ENCOUNTER — Telehealth: Payer: Self-pay | Admitting: Oncology

## 2015-10-17 ENCOUNTER — Other Ambulatory Visit (HOSPITAL_BASED_OUTPATIENT_CLINIC_OR_DEPARTMENT_OTHER): Payer: Medicare Other

## 2015-10-17 VITALS — BP 142/77 | HR 74 | Temp 98.6°F | Resp 18 | Ht 67.0 in | Wt 265.2 lb

## 2015-10-17 DIAGNOSIS — I890 Lymphedema, not elsewhere classified: Secondary | ICD-10-CM

## 2015-10-17 DIAGNOSIS — C7951 Secondary malignant neoplasm of bone: Secondary | ICD-10-CM | POA: Diagnosis not present

## 2015-10-17 DIAGNOSIS — C50812 Malignant neoplasm of overlapping sites of left female breast: Secondary | ICD-10-CM

## 2015-10-17 DIAGNOSIS — C50912 Malignant neoplasm of unspecified site of left female breast: Secondary | ICD-10-CM | POA: Diagnosis not present

## 2015-10-17 DIAGNOSIS — Z853 Personal history of malignant neoplasm of breast: Secondary | ICD-10-CM

## 2015-10-17 LAB — CBC WITH DIFFERENTIAL/PLATELET
BASO%: 0.7 % (ref 0.0–2.0)
BASOS ABS: 0 10*3/uL (ref 0.0–0.1)
EOS%: 2.1 % (ref 0.0–7.0)
Eosinophils Absolute: 0 10*3/uL (ref 0.0–0.5)
HCT: 35.1 % (ref 34.8–46.6)
HEMOGLOBIN: 11.4 g/dL — AB (ref 11.6–15.9)
LYMPH%: 35.5 % (ref 14.0–49.7)
MCH: 28.6 pg (ref 25.1–34.0)
MCHC: 32.6 g/dL (ref 31.5–36.0)
MCV: 87.8 fL (ref 79.5–101.0)
MONO#: 0.2 10*3/uL (ref 0.1–0.9)
MONO%: 10.5 % (ref 0.0–14.0)
NEUT#: 1.1 10*3/uL — ABNORMAL LOW (ref 1.5–6.5)
NEUT%: 51.2 % (ref 38.4–76.8)
Platelets: 221 10*3/uL (ref 145–400)
RBC: 3.99 10*6/uL (ref 3.70–5.45)
RDW: 17 % — AB (ref 11.2–14.5)
WBC: 2.1 10*3/uL — ABNORMAL LOW (ref 3.9–10.3)
lymph#: 0.7 10*3/uL — ABNORMAL LOW (ref 0.9–3.3)

## 2015-10-17 LAB — COMPREHENSIVE METABOLIC PANEL
ALBUMIN: 3.5 g/dL (ref 3.5–5.0)
ALK PHOS: 112 U/L (ref 40–150)
ALT: 11 U/L (ref 0–55)
AST: 13 U/L (ref 5–34)
Anion Gap: 8 mEq/L (ref 3–11)
BUN: 12.5 mg/dL (ref 7.0–26.0)
CO2: 26 mEq/L (ref 22–29)
Calcium: 9.2 mg/dL (ref 8.4–10.4)
Chloride: 107 mEq/L (ref 98–109)
Creatinine: 0.9 mg/dL (ref 0.6–1.1)
EGFR: 88 mL/min/{1.73_m2} — ABNORMAL LOW (ref >90)
GLUCOSE: 93 mg/dL (ref 70–140)
POTASSIUM: 4.3 meq/L (ref 3.5–5.1)
SODIUM: 140 meq/L (ref 136–145)
TOTAL PROTEIN: 7.2 g/dL (ref 6.4–8.3)
Total Bilirubin: 0.65 mg/dL (ref 0.20–1.20)

## 2015-10-17 MED ORDER — LETROZOLE 2.5 MG PO TABS: 2.5000 mg | ORAL_TABLET | Freq: Every day | ORAL | 4 refills | Status: DC

## 2015-10-17 NOTE — Telephone Encounter (Signed)
appt made and avs printed. Radiology to contact pt to schedule scans and ct

## 2015-10-26 DIAGNOSIS — C50919 Malignant neoplasm of unspecified site of unspecified female breast: Secondary | ICD-10-CM | POA: Diagnosis not present

## 2015-11-04 ENCOUNTER — Other Ambulatory Visit (HOSPITAL_BASED_OUTPATIENT_CLINIC_OR_DEPARTMENT_OTHER): Payer: Medicare Other

## 2015-11-04 DIAGNOSIS — Z853 Personal history of malignant neoplasm of breast: Secondary | ICD-10-CM | POA: Diagnosis present

## 2015-11-04 DIAGNOSIS — C50812 Malignant neoplasm of overlapping sites of left female breast: Secondary | ICD-10-CM

## 2015-11-04 LAB — CBC WITH DIFFERENTIAL/PLATELET
BASO%: 0.8 % (ref 0.0–2.0)
BASOS ABS: 0 10*3/uL (ref 0.0–0.1)
EOS ABS: 0.1 10*3/uL (ref 0.0–0.5)
EOS%: 4 % (ref 0.0–7.0)
HEMATOCRIT: 35.5 % (ref 34.8–46.6)
HEMOGLOBIN: 11.5 g/dL — AB (ref 11.6–15.9)
LYMPH#: 0.7 10*3/uL — AB (ref 0.9–3.3)
LYMPH%: 22.4 % (ref 14.0–49.7)
MCH: 28.6 pg (ref 25.1–34.0)
MCHC: 32.5 g/dL (ref 31.5–36.0)
MCV: 87.9 fL (ref 79.5–101.0)
MONO#: 0.2 10*3/uL (ref 0.1–0.9)
MONO%: 5.9 % (ref 0.0–14.0)
NEUT%: 66.9 % (ref 38.4–76.8)
NEUTROS ABS: 2.2 10*3/uL (ref 1.5–6.5)
PLATELETS: 234 10*3/uL (ref 145–400)
RBC: 4.03 10*6/uL (ref 3.70–5.45)
RDW: 16.8 % — AB (ref 11.2–14.5)
WBC: 3.2 10*3/uL — AB (ref 3.9–10.3)

## 2015-11-04 LAB — COMPREHENSIVE METABOLIC PANEL
ALBUMIN: 3.5 g/dL (ref 3.5–5.0)
ALT: <9 U/L (ref 0–55)
AST: 13 U/L (ref 5–34)
Alkaline Phosphatase: 122 U/L (ref 40–150)
Anion Gap: 8 mEq/L (ref 3–11)
BUN: 9.7 mg/dL (ref 7.0–26.0)
CHLORIDE: 107 meq/L (ref 98–109)
CO2: 27 meq/L (ref 22–29)
CREATININE: 0.8 mg/dL (ref 0.6–1.1)
Calcium: 9.6 mg/dL (ref 8.4–10.4)
EGFR: 89 mL/min/{1.73_m2} — ABNORMAL LOW (ref >90)
Glucose: 105 mg/dl (ref 70–140)
POTASSIUM: 4.3 meq/L (ref 3.5–5.1)
SODIUM: 141 meq/L (ref 136–145)
TOTAL PROTEIN: 7.3 g/dL (ref 6.4–8.3)
Total Bilirubin: 1.03 mg/dL (ref 0.20–1.20)

## 2015-11-07 ENCOUNTER — Encounter (HOSPITAL_COMMUNITY): Payer: Self-pay | Admitting: Radiology

## 2015-11-07 ENCOUNTER — Encounter (HOSPITAL_COMMUNITY)
Admission: RE | Admit: 2015-11-07 | Discharge: 2015-11-07 | Disposition: A | Discharge status: Home / Self Care | Payer: Medicare Other | Source: Ambulatory Visit | Attending: Oncology | Admitting: Oncology

## 2015-11-07 ENCOUNTER — Ambulatory Visit (HOSPITAL_COMMUNITY)
Admission: RE | Admit: 2015-11-07 | Discharge: 2015-11-07 | Disposition: A | Discharge status: Home / Self Care | Payer: Medicare Other | Source: Ambulatory Visit | Attending: Oncology | Admitting: Oncology

## 2015-11-07 DIAGNOSIS — C50812 Malignant neoplasm of overlapping sites of left female breast: Secondary | ICD-10-CM | POA: Diagnosis not present

## 2015-11-07 DIAGNOSIS — C50912 Malignant neoplasm of unspecified site of left female breast: Secondary | ICD-10-CM | POA: Diagnosis not present

## 2015-11-07 DIAGNOSIS — I700 Atherosclerosis of aorta: Secondary | ICD-10-CM | POA: Insufficient documentation

## 2015-11-07 DIAGNOSIS — C50919 Malignant neoplasm of unspecified site of unspecified female breast: Secondary | ICD-10-CM | POA: Diagnosis not present

## 2015-11-07 MED ORDER — TECHNETIUM TC 99M MEDRONATE IV KIT: 25.0000 | PACK | Freq: Once | INTRAVENOUS | Status: DC | PRN

## 2015-11-07 MED ORDER — IOPAMIDOL (ISOVUE-300) INJECTION 61%
75.0000 mL | Freq: Once | INTRAVENOUS | Status: AC | PRN
  Administered 2015-11-07: 75 mL via INTRAVENOUS

## 2015-11-11 ENCOUNTER — Telehealth: Payer: Self-pay | Admitting: Oncology

## 2015-11-11 ENCOUNTER — Ambulatory Visit (HOSPITAL_BASED_OUTPATIENT_CLINIC_OR_DEPARTMENT_OTHER): Payer: Medicare Other | Admitting: Oncology

## 2015-11-11 VITALS — BP 142/86 | HR 70 | Temp 98.7°F | Resp 18 | Wt 269.1 lb

## 2015-11-11 DIAGNOSIS — I890 Lymphedema, not elsewhere classified: Secondary | ICD-10-CM

## 2015-11-11 DIAGNOSIS — C50912 Malignant neoplasm of unspecified site of left female breast: Secondary | ICD-10-CM

## 2015-11-11 DIAGNOSIS — C7951 Secondary malignant neoplasm of bone: Secondary | ICD-10-CM | POA: Diagnosis not present

## 2015-11-11 DIAGNOSIS — C50812 Malignant neoplasm of overlapping sites of left female breast: Secondary | ICD-10-CM

## 2015-11-11 NOTE — Progress Notes (Signed)
Okay ID: [name]   DOB: 01-14-1961  MR#: [id number]  HAL#:[id number]  PCP: Gildardo Cranker, DO SU:  Ralene Ok GYN: Harrell Gave, MD OTHER MD: Crissie Reese, Silvano Rusk, Christinia Gully; Drs Tye Savoy and Robbie Louis ([place])  CHIEF COMPLAINT: estrogen receptor positive stage IV breast cancer  CURRENT TREATMENT:  Letrozole, palbociclib, denosumab/Xgeva  BREAST CANCER HISTORY: From the original intake note:  Aveah Castell is a [place] woman who moved to [place] in 2013 with a history of breast cancer, and establishied herself with our practice.  The patient had left breast and left axillary lymph node biopsy performed March of 2010, both positive (I do not have the pathology report at this point). She was treated neo-adjuvantly at [facility] with (a) paclitaxel weekly x12 and (b) doxorubicin/cyclophosphamide in dose dense fashion x4, both given with tifiparnib.   She then proceeded to left modified radical mastectomy 12/23/2008, the final pathology showing a 4 cm residual invasive lobular carcinoma, involving 5/22 lymph nodes sampled. The tumor was estrogen and progesterone receptor positive, HER-2 negative.   Postoperatively she received 50 gray of radiation completed January of 2011, including the left supraclavicular lymph node basin. She was then started on tamoxifen, but developed some uterine lining thickening and was switched to anastrozole in May 2012. Her subsequent history is as detailed below  INTERVAL HISTORY: Messiah returns today for follow-up of her breast cancer. After her last visit here I reviewed her January CT scan of the chest with radiology and they concurred the reading that led to her being on enoxaparin was an obvious dictation error. This is being corrected. I gave her a copy of the corrected CT scan report. As soon as I confirmed that we stopped her Lovenox. She reports having no side effects from that medication and no bleeding complications  from  We also proceeded to restaging with a repeat CT of the chest and bone scan. These showed no visceral disease. Her bony disease is stable as compared to prior.  She is tolerating the letrozole and palbociclib well, and specifically maintains a good neutrophil count. She does not have significant problems with hot flashes, vaginal dryness, or fatigue. She obtains a drug at a good price.  REVIEW OF SYSTEMS: Meiya complains of occasional headaches which are not more persistent or intense than before. Her scalp is tender at times. She has some discomfort in her mouth, but no source or thrush that she is aware of, and she has some discomfort in the left flank and occasional problems with urination. Aside from these issues a detailed review of systems today was stable  PAST MEDICAL HISTORY: Past Medical History:  Diagnosis Date  . Anxiety   . Arthritis    hands, knees, hips  . Asthma   . Cancer ([place])   . Cervical stenosis (uterine cervix)   . Dyspnea on exertion   . GERD (gastroesophageal reflux disease)   . Headache(784.0)   . History of breast cancer    2010--  LEFT  s/p  mastectomy (in [place]) AND CHEMORADIATION--  NO RECURRENCE  . History of cervical dysplasia   . History of TB skin testing    AS TEEN--  [place] W/ MEDS  . Hyperlipidemia   . Hypertension   . OSA (obstructive sleep apnea) moderate osa per study  09/2010   CPAP  , NOT USING ON REGULAR BASIS  . Pelvic pain in female   . Positive H. pylori test    08-05-2013  . Refusal  of blood transfusions as patient is Jehovah's Witness   . Seasonal allergies   . Uterine fibroid   . Wears glasses     PAST SURGICAL HISTORY: Past Surgical History:  Procedure Laterality Date  . CARDIOVASCULAR STRESS TEST  10-29-2012   low risk perfusion study/  no significant reversibity/ ef 66%/  normal wall motion  . CERVICAL CONIZATION W/BX  2012   in  [place] N/A 05/14/2012   Procedure: LAPAROSCOPIC CHOLECYSTECTOMY;  Surgeon:  Ralene Ok, MD;  Location: [facility];  Service: General;  Laterality: N/A;  . COLONOSCOPY  2011   normal per patient - [place]  . DILATION AND CURETTAGE OF UTERUS  10/15/2011   Procedure: DILATATION AND CURETTAGE;  Surgeon: Melina Schools, MD;  Location: [facility];  Service: Gynecology;  Laterality: N/A;  Conization &  endocervical curettings  . EXAMINATION UNDER ANESTHESIA N/A 08/20/2013   Procedure: EXAM UNDER ANESTHESIA;  Surgeon: Margarette Asal, MD;  Location: [facility];  Service: Gynecology;  Laterality: N/A;  . [place]   right  . LAPAROSCOPIC ASSISTED VAGINAL HYSTERECTOMY N/A 10/26/2014   Procedure: HYSTERECTOMY ABDOMINAL ;  Surgeon: Molli Posey, MD;  Location: [facility];  Service: Gynecology;  Laterality: N/A;  . MASTECTOMY Left 11/2008  in [place]   . SALPINGOOPHORECTOMY Bilateral 10/26/2014   Procedure: BILATERAL SALPINGO OOPHORECTOMY;  Surgeon: Molli Posey, MD;  Location: [facility];  Service: Gynecology;  Laterality: Bilateral;  . TRANSTHORACIC ECHOCARDIOGRAM  10-29-2012   mild lvh/  ef 60-65%/  grade II diastolic dysfunction/  trivial mr  &  tr    FAMILY HISTORY Family History  Problem Relation Age of Onset  . Breast cancer Mother   . Colon cancer Mother   . Hypotension Mother   . Asthma Mother   . Diabetes type II Mother   . Arthritis Mother   . Clotting disorder Mother   . Cancer Mother     breast/colon  . Mental illness Brother   . Heart disease Brother   . Cerebral palsy Daughter   . Emphysema Brother     never smoker  . Colon cancer Maternal Aunt   . Cancer Maternal Aunt     colon  . Colon cancer Maternal Uncle   . Cancer Maternal Uncle     colon  . Esophageal cancer Neg Hx   . Rectal cancer Neg Hx   . Stomach cancer Neg Hx   . Thyroid disease Neg Hx    Gynecologic history: She is GX P5. At first pregnancy to term age 81. Menarche age 45. Last menstrual period when she had her chemotherapy.   Social History: She is  disabled secondary to arthritis. She moved from [place] to [place] for "peace of mind". Of her 5 children only one daughter is living in [place], [place]. One daughter in [place] is incarcerated and another one is "in a lot of stress". The patient lives alone. She mostly reads and does computer games on her phone during the day. She has 20 grandchildren.    ADVANCED DIRECTIVES: She does not have a healthcare power of attorney.    HEALTH MAINTENANCE: Social History  Substance Use Topics  . Smoking status: Former Smoker    Packs/day: 0.30    Years: 10.00    Types: Cigarettes    Quit date: 03/26/1988  . Smokeless tobacco: Never Used  . Alcohol use No     Allergies  Allergen Reactions  . Adhesive [Tape] Itching and Other (See Comments)    REDDNESS  . Calcium Carbonate Antacid Hives    Fruit flavored Tums   . Hctz [Hydrochlorothiazide] Itching  . Latex Itching  . Lisinopril Itching  . Losartan Potassium Other (See Comments)    Makes her feel "bad"    Current Outpatient Prescriptions  Medication Sig Dispense Refill  . albuterol (VENTOLIN HFA) 108 (90 BASE) MCG/ACT inhaler Inhale 2 puffs into the lungs every 4 (four) hours as needed for wheezing or shortness of breath.     . docusate sodium (COLACE) 100 MG capsule Take 1 capsule (100 mg total) by mouth every 12 (twelve) hours. 60 capsule 0  . enoxaparin (LOVENOX) 120 MG/0.8ML injection Inject 120 mg into the skin 2 (two) times daily.    Marland Kitchen ibuprofen (ADVIL,MOTRIN) 800 MG tablet Take 1 tablet (800 mg total) by mouth every 8 (eight) hours as needed for moderate pain (mild pain). 30 tablet 1  . letrozole (FEMARA) 2.5 MG tablet Take 1 tablet (2.5 mg total) by mouth daily. 90 tablet 4  . loratadine (CLARITIN) 10 MG tablet Take 10 mg by mouth daily.    . metoprolol (LOPRESSOR) 50 MG tablet Take 50 mg by mouth 2 (two) times daily.    . palbociclib (IBRANCE) 100 MG capsule Take 100 mg by mouth daily with breakfast. Take whole with  food.     No current facility-administered medications for this visit.    Facility-Administered Medications Ordered in Other Visits  Medication Dose Route Frequency Provider Last Rate Last Dose  . technetium medronate (TC-MDP) injection 25 millicurie  25 millicurie Intravenous Once PRN David A Martinique, MD        OBJECTIVE: Middle-aged African American womanWho appears stated age 51:   11/11/15 1156  BP: (!) 142/86  Pulse: 70  Resp: 18  Temp: 98.7 F (37.1 C)     Body mass index is 42.15 kg/m.    ECOG FS: 1 Filed Weights   11/11/15 1156  Weight: 269 lb 1.6 oz (122.1 kg)   Sclerae unicteric, EOMs intact Oropharynx clear and moist No cervical or supraclavicular adenopathy Lungs no rales or rhonchi Heart regular rate and rhythm Abd soft, nontender, positive bowel sounds MSK no focal spinal tenderness, no upper extremity lymphedema Neuro: nonfocal, well oriented, appropriate affect Breasts: The right breast is unremarkable. The left breast is status post mastectomy and radiation. There is no evidence of chest wall recurrence. The left axilla is benign.   LAB RESULTS: Lab Results  Component Value Date   WBC 3.2 (L) 11/04/2015   NEUTROABS 2.2 11/04/2015   HGB 11.5 (L) 11/04/2015   HCT 35.5 11/04/2015   MCV 87.9 11/04/2015   PLT 234 11/04/2015      Chemistry      Component Value Date/Time   NA 141 11/04/2015 0950   K 4.3 11/04/2015 0950   CL 105 04/17/2015 2041   CL 105 07/07/2012 1327   CO2 27 11/04/2015 0950   BUN 9.7 11/04/2015 0950   CREATININE 0.8 11/04/2015 0950      Component Value Date/Time   CALCIUM 9.6 11/04/2015 0950   ALKPHOS 122 11/04/2015 0950   AST 13 11/04/2015 0950   ALT <9 11/04/2015 0950   BILITOT 1.03 11/04/2015 0950       Lab Results  Component Value Date   LABCA2 19 01/17/2012     STUDIES: Ct Chest W Contrast  Result Date: 11/07/2015 CLINICAL DATA:  Restaging left breast  cancer, ongoing oral chemotherapy, osseous metastatic  disease with radiation therapy to the left eighth rib and right hip. EXAM: CT CHEST WITH CONTRAST TECHNIQUE: Multidetector CT imaging of the chest was performed during intravenous contrast administration. CONTRAST:  65m ISOVUE-300 IOPAMIDOL (ISOVUE-300) INJECTION 61% COMPARISON:  Bone scan 04/19/2015 and CT chest 04/19/2015. FINDINGS: Cardiovascular: Minimal atherosclerotic calcification of the aorta. Heart size within normal limits. No pericardial effusion. Mediastinum/Nodes: No pathologically enlarged mediastinal, hilar, internal mammary or axillary lymph nodes. Surgical clips are seen in the left axilla. Esophagus is grossly unremarkable. Lungs/Pleura: Lungs are clear. No pleural fluid. Airway is unremarkable. Upper Abdomen: Visualized portions of the liver, adrenal gland, kidneys, spleen, pancreas, stomach and bowel are grossly unremarkable. No upper abdominal adenopathy. Musculoskeletal: There is a mottled appearance to the osseous structures, new. IMPRESSION: 1. Mild diffuse osseous mottling appears new. Metastatic disease cannot be excluded. 2. Minimal aortic atherosclerosis. Electronically Signed   By: MLorin PicketM.D.   On: 11/07/2015 12:12   Nm Bone Scan Whole Body  Result Date: 11/07/2015 CLINICAL DATA:  Breast cancer. New right side jaw and neck pain. Left groin pain. EXAM: NUCLEAR MEDICINE WHOLE BODY BONE SCAN TECHNIQUE: Whole body anterior and posterior images were obtained approximately 3 hours after intravenous injection of radiopharmaceutical. RADIOPHARMACEUTICALS:  04/19/2015 mCi Technetium-915mDP IV COMPARISON:  04/19/2015 FINDINGS: Previously seen activity within the distal right humerus not visualized on today's study. Continued increased activity within the anterior right eighth ribs, unchanged since prior study. Focally increased activity at the right costovertebral junction is also stable. No new areas of abnormal uptake. Soft tissue activity unremarkable. IMPRESSION: No definite  area of abnormal bony uptake in the distal right humerus seen on today's study. Other areas of increased activity in the anterior right ribs and the right eighth costovertebral junction are stable. No new areas of abnormal activity. Electronically Signed   By: KeRolm Baptise.D.   On: 11/07/2015 13:09     IMPRESSION: [age].o.   [place]   (1) breast cancer of left breast overlapping sites diagnosed March of 2010 [facility] x12 and (b) doxorubicin/ cyclophosphamide in dose dense fashion x4, both given with tifiparnib.   (2) definitive left modified radical mastectomy September of 2010 for a T2 N2 or stage IIIA invasive lobular breast cancer, estrogen and progesterone receptor positive, HER-2 negative,   (3)  post mastectomy radiation completed January of 2011,  (4) briefly on tamoxifen, discontinued it because of uterine lining concerns.  On anastrozole since May 2012 with good tolerance. Held between November and December 2013 due to GI issues, unrelated to cancer or its treatment.  Discontinued by the patient summer of 2016  (5)  Other problems include left upper extremity lymphedema, borderline concern regarding genetic family history, history of cervical dysplasia, and history of dense breasts.  METASTATIC DISEASE: (6) iliac bone biopsy (laterality?) 05/26/2015 showed metastatic carcinoma consistent with a breast primary, estrogen receptor positive, progesterone receptor and HER-2 negative  (a) extensive staging studies including CT scans of the chest abdomen and pelvis, MRI of the brain, [place] of [place]   (7) palliative radiation to the right 8th rib lesion (single 8 gray dose) and right hip (20 gray in 5 fractions) [facility] 06/24/2015 [facility](8) letrozole and palbociclib started March 2017  (a)  palbociclib dose decreased to 100 mg  daily (21/7) because of cytopenias  (b) repeat chest CT and bone scan 11/07/2015 shows stable bone only metastatic disease  (9) enoxaparin started February 2017 for reading of "central pulmonary embolus" on CT scan of the chest 04/19/2015  (a) CT report amended 10/18/2015 show no pulmonary embolus; enoxaparin discontinued  (10) denosumab/Xgeva started 11/16/2015, repeated monthly  PLAN: Jayleena was just restaged and there is no evidence of disease progression. Accordingly we are continuing her palbociclib and letrozole as before. We are also starting her on denosumab/Xgeva. Today we discussed the possible toxicities, side effects and complications of this agent. She will have her first dose 11/16/2015  I gave her a copy of the revised reading on the January CT scan, which confirms there was a transcription error. This led to her being on enoxaparin for several months for her [place] physicians. Luckily she had no complications from that medication. That was discontinued as soon as we reviewed the CT scan with radiology  We again reviewed the fact that she has metastatic disease, but it is not curable, and that at some point very likely the cancer will grow through her current treatments. At that point we will consider other antiestrogen alternatives such as fulvestrant and or everolimus/exemestane. I do not see a role for chemotherapy at this point  She knows to call for any problems that may develop before her next visit here.            Enora Trillo C 11/11/2015

## 2015-11-11 NOTE — Telephone Encounter (Signed)
appt made and avs printed °

## 2015-11-13 DIAGNOSIS — C7951 Secondary malignant neoplasm of bone: Secondary | ICD-10-CM | POA: Insufficient documentation

## 2015-11-14 ENCOUNTER — Other Ambulatory Visit: Payer: Self-pay | Admitting: *Deleted

## 2015-11-14 ENCOUNTER — Telehealth: Payer: Self-pay | Admitting: *Deleted

## 2015-11-14 MED ORDER — PALBOCICLIB 100 MG PO CAPS: ORAL_CAPSULE | ORAL | 0 refills | Status: DC

## 2015-11-14 NOTE — Telephone Encounter (Signed)
Please advise.   "I need a refill on Ibrance from Dr. Jana Hakim sent to [facility].  Dr. In [place] can no longer order this.  I am on my week off and need to resume on 11-17-2015."  Lab at [facility] 11-04-2015 cbc diff and CMET are WNL

## 2015-11-15 ENCOUNTER — Other Ambulatory Visit: Payer: Self-pay | Admitting: *Deleted

## 2015-11-15 MED ORDER — PALBOCICLIB 100 MG PO CAPS: ORAL_CAPSULE | ORAL | 3 refills | Status: DC

## 2015-11-15 NOTE — Telephone Encounter (Signed)
Refill has been sent eRx.

## 2015-11-16 ENCOUNTER — Ambulatory Visit: Payer: Medicare Other

## 2015-11-16 ENCOUNTER — Other Ambulatory Visit (HOSPITAL_BASED_OUTPATIENT_CLINIC_OR_DEPARTMENT_OTHER): Payer: Medicare Other

## 2015-11-16 ENCOUNTER — Telehealth: Payer: Self-pay | Admitting: *Deleted

## 2015-11-16 DIAGNOSIS — C50912 Malignant neoplasm of unspecified site of left female breast: Secondary | ICD-10-CM

## 2015-11-16 DIAGNOSIS — C7951 Secondary malignant neoplasm of bone: Secondary | ICD-10-CM

## 2015-11-16 DIAGNOSIS — C50812 Malignant neoplasm of overlapping sites of left female breast: Secondary | ICD-10-CM

## 2015-11-16 LAB — COMPREHENSIVE METABOLIC PANEL
ALBUMIN: 3.6 g/dL (ref 3.5–5.0)
ALK PHOS: 117 U/L (ref 40–150)
AST: 13 U/L (ref 5–34)
Anion Gap: 8 mEq/L (ref 3–11)
BILIRUBIN TOTAL: 0.86 mg/dL (ref 0.20–1.20)
BUN: 13.5 mg/dL (ref 7.0–26.0)
CALCIUM: 9.5 mg/dL (ref 8.4–10.4)
CO2: 24 mEq/L (ref 22–29)
CREATININE: 0.8 mg/dL (ref 0.6–1.1)
Chloride: 108 mEq/L (ref 98–109)
EGFR: >90 mL/min/{1.73_m2} (ref >90)
GLUCOSE: 101 mg/dL (ref 70–140)
POTASSIUM: 4.3 meq/L (ref 3.5–5.1)
SODIUM: 140 meq/L (ref 136–145)
TOTAL PROTEIN: 7.4 g/dL (ref 6.4–8.3)

## 2015-11-16 LAB — CBC WITH DIFFERENTIAL/PLATELET
BASO%: 1.5 % (ref 0.0–2.0)
Basophils Absolute: 0 10*3/uL (ref 0.0–0.1)
EOS%: 8.2 % — ABNORMAL HIGH (ref 0.0–7.0)
Eosinophils Absolute: 0.2 10*3/uL (ref 0.0–0.5)
HCT: 34.4 % — ABNORMAL LOW (ref 34.8–46.6)
HGB: 11.5 g/dL — ABNORMAL LOW (ref 11.6–15.9)
LYMPH%: 30.9 % (ref 14.0–49.7)
MCH: 29.2 pg (ref 25.1–34.0)
MCHC: 33.4 g/dL (ref 31.5–36.0)
MCV: 87.3 fL (ref 79.5–101.0)
MONO#: 0.2 10*3/uL (ref 0.1–0.9)
MONO%: 7.8 % (ref 0.0–14.0)
NEUT#: 1.4 10*3/uL — ABNORMAL LOW (ref 1.5–6.5)
NEUT%: 51.6 % (ref 38.4–76.8)
PLATELETS: 168 10*3/uL (ref 145–400)
RBC: 3.94 10*6/uL (ref 3.70–5.45)
RDW: 15.9 % — ABNORMAL HIGH (ref 11.2–14.5)
WBC: 2.7 10*3/uL — AB (ref 3.9–10.3)
lymph#: 0.8 10*3/uL — ABNORMAL LOW (ref 0.9–3.3)

## 2015-11-16 NOTE — Telephone Encounter (Signed)
This RN spoke with pt post lab draw today for review as well as pt was scheduled for xygeva but informed injection nurse " I have not been cleared by the dentist "  Noted ANC of 1.4.  Pt is to restart Ibrance at 100 mg daily 8/24.  She states she has made a dental appointment for 9 am tomorrow for dental clearance- she had dental extractions done while in [place] approximately 3 months ago- " but he never cleared me to start the shot and I do not know if if need more work done "  Per above- plan is pt will hold restarting the [employer] tomorrow- as well as xygeva shot will be held until post dental visit in am.  Pt given copy of labs obtained today with name of drug to give to dentist as well as this RN's name and return call number.  This RN will call pt 8/24 for dental update as well as date for next lab for restart of current prescription of Ibrance.

## 2015-11-16 NOTE — Progress Notes (Signed)
Patient scheduled for first time dose of Xgeva. Patient was in lab and had phlebotomist call this nurse, pt unsure why she was getting this injection. Went and spoke with patient, states she remembers talking with Dr. Jana Hakim regarding this injection but never got clearance from her dentist to get it. States she had 2 extractions recently in [place] and hasnt seen a dentist here since then. Talked with Val, RN. Pt to see dentist for clearance and Val will reschedule injection appt. Pt verbalized understanding, states she will try and get in touch with her old dentist she had here in [place]. Pt requested to wait for CMET result and Val, Rn will come out and speak with her regarding results and Ibrance.

## 2015-11-21 ENCOUNTER — Ambulatory Visit (INDEPENDENT_AMBULATORY_CARE_PROVIDER_SITE_OTHER): Payer: Medicare Other | Admitting: Nurse Practitioner

## 2015-11-21 ENCOUNTER — Encounter: Payer: Self-pay | Admitting: Nurse Practitioner

## 2015-11-21 VITALS — BP 132/78 | HR 65 | Temp 97.7°F | Resp 17 | Ht 67.0 in | Wt 267.4 lb

## 2015-11-21 DIAGNOSIS — K5901 Slow transit constipation: Secondary | ICD-10-CM

## 2015-11-21 DIAGNOSIS — J301 Allergic rhinitis due to pollen: Secondary | ICD-10-CM | POA: Diagnosis not present

## 2015-11-21 DIAGNOSIS — Z23 Encounter for immunization: Secondary | ICD-10-CM

## 2015-11-21 DIAGNOSIS — C50812 Malignant neoplasm of overlapping sites of left female breast: Secondary | ICD-10-CM

## 2015-11-21 DIAGNOSIS — E785 Hyperlipidemia, unspecified: Secondary | ICD-10-CM | POA: Diagnosis not present

## 2015-11-21 DIAGNOSIS — I10 Essential (primary) hypertension: Secondary | ICD-10-CM | POA: Diagnosis not present

## 2015-11-21 LAB — LIPID PANEL
CHOL/HDL RATIO: 4.6 ratio (ref <=5.0)
CHOLESTEROL: 213 mg/dL — AB (ref 125–200)
HDL: 46 mg/dL (ref >=46)
LDL Cholesterol: 148 mg/dL — ABNORMAL HIGH (ref <130)
Triglycerides: 96 mg/dL (ref <150)
VLDL: 19 mg/dL (ref <30)

## 2015-11-21 MED ORDER — LORATADINE 10 MG PO TABS: 10.0000 mg | ORAL_TABLET | Freq: Every day | ORAL | 1 refills | Status: DC

## 2015-11-21 NOTE — Addendum Note (Signed)
Addended by: Logan Bores on: 11/21/2015 02:29 PM   Modules accepted: Orders

## 2015-11-21 NOTE — Progress Notes (Signed)
Careteam: Patient Care Team: Gildardo Cranker, DO as PCP - General (Internal Medicine) Blanchie Serve, MD as Referring Physician (Internal Medicine) Chauncey Cruel, MD as Consulting Physician (Oncology)  Advanced Directive information    Allergies  Allergen Reactions  . Lisinopril-Hydrochlorothiazide Itching  . Adhesive [Tape] Itching and Other (See Comments)    REDDNESS REDDNESS  . Calcium Hives    Fruit flavored Tums  Fruit flavored Tums   . Calcium Carbonate Antacid Hives    Fruit flavored Tums   . Hctz [Hydrochlorothiazide] Itching  . Latex Itching  . Lisinopril Itching  . Losartan Potassium Other (See Comments)    Makes her feel "bad" Makes her feel "bad"    Chief Complaint  Patient presents with  . Acute Visit    Follow up on cancer treatment for breast cancer.      HPI: Patient is a 55 y.o. female seen in the office today due to not being in office for a while. Reports she needs annual and cancer has returned. 4 She had taken a trip to [place] due to her aunt passing, got sick while she was there and found out her cancer had returned while she was in [place] in Feb 2017.  MRI was done in Feb and she had a work up after that. Has records from [place] gave them to oncologist at last visit however do not see them in Epic at this time.  LAST saw Dr Jana Hakim 11/11/15 due to stage 4 breast cancer with mets to the bone, has spots in her hip, spine, rib cage, skull, sternum.  CURRENT TREATMENT:  Letrozole, palbociclib, denosumab/Xgeva Also receiving palliative radiation.  Having pain in her hips, knees and feet. Hard to put pressure on feet.  Soaked feet in epsom salt which relieved pressure. Not taking any medication for pain. Reports she has a gabapenin Cream? that she applies to her legs and feet that help. Was prescribed in [place]. Was last seen in July in [place] and has been back in [place] since July 10th.   Worsening allergies, itching, watery eyes and sneezing. Needs refill for  Claritin.  Review of Systems:  Review of Systems  Constitutional: Negative for activity change, appetite change, chills, diaphoresis, fatigue and fever.  HENT: Negative for ear pain and sore throat.   Eyes: Negative for visual disturbance.  Respiratory: Negative for cough, chest tightness, shortness of breath and wheezing.   Cardiovascular: Negative for chest pain, palpitations and leg swelling.  Gastrointestinal: Negative for abdominal pain, constipation (controlled on colace), diarrhea, nausea and vomiting.  Genitourinary: Negative for pelvic pain, vaginal bleeding and vaginal discharge.  Musculoskeletal: Positive for arthralgias.  Neurological: Negative for dizziness, tremors and headaches.  Psychiatric/Behavioral: Negative for sleep disturbance. The patient is not nervous/anxious.     Past Medical History:  Diagnosis Date  . Anxiety   . Arthritis    hands, knees, hips  . Asthma   . Cancer ([place])   . Cervical stenosis (uterine cervix)   . Dyspnea on exertion   . GERD (gastroesophageal reflux disease)   . Headache(784.0)   . History of breast cancer    2010--  LEFT  s/p  mastectomy (in [place]) AND CHEMORADIATION--  NO RECURRENCE  . History of cervical dysplasia   . History of TB skin testing    AS TEEN--  [place] W/ MEDS  . Hyperlipidemia   . Hypertension   . OSA (obstructive sleep apnea) moderate osa per study  09/2010   CPAP  ,  NOT USING ON REGULAR BASIS  . Pelvic pain in female   . Positive H. pylori test    08-05-2013  . Refusal of blood transfusions as patient is Jehovah's Witness   . Seasonal allergies   . Uterine fibroid   . Wears glasses    Past Surgical History:  Procedure Laterality Date  . CARDIOVASCULAR STRESS TEST  10-29-2012   low risk perfusion study/  no significant reversibity/ ef 66%/  normal wall motion  . CERVICAL CONIZATION W/BX  2012   in  [place] N/A 05/14/2012   Procedure: LAPAROSCOPIC CHOLECYSTECTOMY;  Surgeon: Ralene Ok, MD;   Location: [facility];  Service: General;  Laterality: N/A;  . COLONOSCOPY  2011   normal per patient - [place]  . DILATION AND CURETTAGE OF UTERUS  10/15/2011   Procedure: DILATATION AND CURETTAGE;  Surgeon: Melina Schools, MD;  Location: [facility];  Service: Gynecology;  Laterality: N/A;  Conization &  endocervical curettings  . EXAMINATION UNDER ANESTHESIA N/A 08/20/2013   Procedure: EXAM UNDER ANESTHESIA;  Surgeon: Margarette Asal, MD;  Location: [facility];  Service: Gynecology;  Laterality: N/A;  . [place]   right  . LAPAROSCOPIC ASSISTED VAGINAL HYSTERECTOMY N/A 10/26/2014   Procedure: HYSTERECTOMY ABDOMINAL ;  Surgeon: Molli Posey, MD;  Location: [facility];  Service: Gynecology;  Laterality: N/A;  . MASTECTOMY Left 11/2008  in [place]   . SALPINGOOPHORECTOMY Bilateral 10/26/2014   Procedure: BILATERAL SALPINGO OOPHORECTOMY;  Surgeon: Molli Posey, MD;  Location: [facility];  Service: Gynecology;  Laterality: Bilateral;  . TRANSTHORACIC ECHOCARDIOGRAM  10-29-2012   mild lvh/  ef 60-65%/  grade II diastolic dysfunction/  trivial mr  &  tr   Social History:   reports that she quit smoking about 27 years ago. Her smoking use included Cigarettes. She has a 3.00 pack-year smoking history. She has never used smokeless tobacco. She reports that she does not drink alcohol or use drugs.  Family History  Problem Relation Age of Onset  . Breast cancer Mother   . Colon cancer Mother   . Hypotension Mother   . Asthma Mother   . Diabetes type II Mother   . Arthritis Mother   . Clotting disorder Mother   . Cancer Mother     breast/colon  . Mental illness Brother   . Heart disease Brother   . Cerebral palsy Daughter   . Emphysema Brother     never smoker  . Colon cancer Maternal Aunt   . Cancer Maternal Aunt     colon  . Colon cancer Maternal Uncle   . Cancer Maternal Uncle     colon  . Esophageal cancer Neg Hx   . Rectal cancer Neg Hx   . Stomach cancer  Neg Hx   . Thyroid disease Neg Hx     Medications: Patient's Medications  New Prescriptions   No medications on file  Previous Medications   DOCUSATE SODIUM (COLACE) 100 MG CAPSULE    Take 1 capsule (100 mg total) by mouth every 12 (twelve) hours.   LETROZOLE (FEMARA) 2.5 MG TABLET    Take 1 tablet (2.5 mg total) by mouth daily.   LORATADINE (CLARITIN) 10 MG TABLET    Take 10 mg by mouth daily.   METOPROLOL (LOPRESSOR) 50 MG TABLET    Take 50 mg by mouth 2 (two) times daily.   PALBOCICLIB (IBRANCE) 100 MG CAPSULE  Take whole with food.  Take 1 tab daily x 21 days then 7 day rest.  Modified Medications   No medications on file  Discontinued Medications   ALBUTEROL (VENTOLIN HFA) 108 (90 BASE) MCG/ACT INHALER    Inhale 2 puffs into the lungs every 4 (four) hours as needed for wheezing or shortness of breath.    IBUPROFEN (ADVIL,MOTRIN) 800 MG TABLET    Take 1 tablet (800 mg total) by mouth every 8 (eight) hours as needed for moderate pain (mild pain).     Physical Exam:  Vitals:   11/21/15 0939  BP: 132/78  Pulse: 65  Resp: 17  Temp: 97.7 F (36.5 C)  TempSrc: Oral  SpO2: 93%  Weight: 267 lb 6.4 oz (121.3 kg)  Height: 5\' 7"  (1.702 m)   Body mass index is 41.88 kg/m.  Physical Exam  Constitutional: She is oriented to person, place, and time. She appears well-developed and well-nourished. No distress.  HENT:  Head: Normocephalic and atraumatic.  Neck: Normal range of motion.  Cardiovascular: Normal rate, regular rhythm and normal heart sounds.   Pulmonary/Chest: Effort normal and breath sounds normal.  Abdominal: Soft. Bowel sounds are normal.  Musculoskeletal: She exhibits no edema or tenderness.  Neurological: She is alert and oriented to person, place, and time.  Skin: Skin is warm and dry. She is not diaphoretic.  Psychiatric:  Flat affect   Labs reviewed: Basic Metabolic Panel:  Recent Labs  01/20/15 1456  04/17/15 2041 10/17/15 1517 11/04/15 0950  11/16/15 1101  NA  --   < > 139 140 141 140  K  --   < > 3.9 4.3 4.3 4.3  CL  --   --  105  --   --   --   CO2  --   < > 24 26 27 24   GLUCOSE  --   < > 92 93 105 101  BUN  --   < > 10 12.5 9.7 13.5  CREATININE  --   < > 0.80 0.9 0.8 0.8  CALCIUM  --   < > 9.4 9.2 9.6 9.5  TSH 0.992  --   --   --   --   --   < > = values in this interval not displayed. Liver Function Tests:  Recent Labs  10/17/15 1517 11/04/15 0950 11/16/15 1101  AST 13 13 13   ALT 11 <9 <9  ALKPHOS 112 122 117  BILITOT 0.65 1.03 0.86  PROT 7.2 7.3 7.4  ALBUMIN 3.5 3.5 3.6   No results for input(s): LIPASE, AMYLASE in the last 8760 hours. No results for input(s): AMMONIA in the last 8760 hours. CBC:  Recent Labs  10/17/15 1517 11/04/15 0950 11/16/15 1102  WBC 2.1* 3.2* 2.7*  NEUTROABS 1.1* 2.2 1.4*  HGB 11.4* 11.5* 11.5*  HCT 35.1 35.5 34.4*  MCV 87.8 87.9 87.3  PLT 221 234 168   Lipid Panel: No results for input(s): CHOL, HDL, LDLCALC, TRIG, CHOLHDL, LDLDIRECT in the last 8760 hours. TSH:  Recent Labs  01/20/15 1456  TSH 0.992   A1C: No results found for: HGBA1C   Assessment/Plan 1. Essential hypertension -stable on lopressor 50 mg BID, cont current regimen   2. Slow transit constipation -well controlled on colace 100 mg BID  3. Hyperlipidemia -currently not on medication, fasting today - Lipid panel  4. Non-seasonal allergic rhinitis due to pollen - loratadine (CLARITIN) 10 MG tablet; Take 1 tablet (10 mg total) by mouth daily.  Dispense: 90 tablet; Refill: 1  5. Cancer of overlapping sites of left female breast [facility]) -following with oncologist for breast cancer with mets to the bone. conts on Letrozole, palbociclib, denosumab/Xgeva with palliative radiation. Will bring cream to next OV to add to medication list. Current pain controlled on PRN tylenol   6. Encounter for immunization -called oncologist to confirm correct influenza vaccine - Flu Vaccine QUAD 36+ mos IM  To  follow up in 1 month with Dr Eulas Post for Physical  Carlos American. Harle Battiest  [facility] [phone] 8 am - 5 pm) [phone] (after hours)

## 2015-11-22 ENCOUNTER — Other Ambulatory Visit: Payer: Self-pay | Admitting: Oncology

## 2015-11-23 DIAGNOSIS — Z1231 Encounter for screening mammogram for malignant neoplasm of breast: Secondary | ICD-10-CM | POA: Diagnosis not present

## 2015-11-23 DIAGNOSIS — Z853 Personal history of malignant neoplasm of breast: Secondary | ICD-10-CM | POA: Diagnosis not present

## 2015-11-28 DIAGNOSIS — C50919 Malignant neoplasm of unspecified site of unspecified female breast: Secondary | ICD-10-CM | POA: Diagnosis not present

## 2015-11-29 ENCOUNTER — Telehealth: Payer: Self-pay | Admitting: Nurse Practitioner

## 2015-11-29 NOTE — Telephone Encounter (Signed)
Per Roderic Ovens at Dr. Jennye Boroughs office, this patient has received dental clearance to receive injections. Dr. Jana Hakim notified.

## 2015-12-02 ENCOUNTER — Encounter (HOSPITAL_COMMUNITY): Payer: Self-pay | Admitting: Emergency Medicine

## 2015-12-02 ENCOUNTER — Emergency Department (HOSPITAL_COMMUNITY): Payer: Medicare Other

## 2015-12-02 ENCOUNTER — Other Ambulatory Visit: Payer: Self-pay | Admitting: Oncology

## 2015-12-02 ENCOUNTER — Emergency Department (HOSPITAL_COMMUNITY)
Admission: EM | Admit: 2015-12-02 | Discharge: 2015-12-03 | Disposition: A | Discharge status: Home / Self Care | Payer: Medicare Other | Attending: Emergency Medicine | Admitting: Emergency Medicine

## 2015-12-02 DIAGNOSIS — Z79899 Other long term (current) drug therapy: Secondary | ICD-10-CM | POA: Insufficient documentation

## 2015-12-02 DIAGNOSIS — R1030 Lower abdominal pain, unspecified: Secondary | ICD-10-CM | POA: Diagnosis not present

## 2015-12-02 DIAGNOSIS — J45909 Unspecified asthma, uncomplicated: Secondary | ICD-10-CM | POA: Insufficient documentation

## 2015-12-02 DIAGNOSIS — Z853 Personal history of malignant neoplasm of breast: Secondary | ICD-10-CM | POA: Diagnosis not present

## 2015-12-02 DIAGNOSIS — N390 Urinary tract infection, site not specified: Secondary | ICD-10-CM | POA: Diagnosis not present

## 2015-12-02 DIAGNOSIS — B9689 Other specified bacterial agents as the cause of diseases classified elsewhere: Secondary | ICD-10-CM | POA: Diagnosis not present

## 2015-12-02 DIAGNOSIS — N281 Cyst of kidney, acquired: Secondary | ICD-10-CM | POA: Diagnosis not present

## 2015-12-02 DIAGNOSIS — Z87891 Personal history of nicotine dependence: Secondary | ICD-10-CM | POA: Diagnosis not present

## 2015-12-02 DIAGNOSIS — R1111 Vomiting without nausea: Secondary | ICD-10-CM | POA: Diagnosis not present

## 2015-12-02 DIAGNOSIS — I10 Essential (primary) hypertension: Secondary | ICD-10-CM | POA: Diagnosis not present

## 2015-12-02 DIAGNOSIS — R030 Elevated blood-pressure reading, without diagnosis of hypertension: Secondary | ICD-10-CM | POA: Diagnosis not present

## 2015-12-02 LAB — URINALYSIS, ROUTINE W REFLEX MICROSCOPIC
GLUCOSE, UA: NEGATIVE mg/dL
Hgb urine dipstick: NEGATIVE
Ketones, ur: NEGATIVE mg/dL
NITRITE: NEGATIVE
PH: 6 (ref 5.0–8.0)
Protein, ur: NEGATIVE mg/dL
Specific Gravity, Urine: 1.031 — ABNORMAL HIGH (ref 1.005–1.030)

## 2015-12-02 LAB — CBC
HEMATOCRIT: 36.8 % (ref 36.0–46.0)
HEMOGLOBIN: 12.2 g/dL (ref 12.0–15.0)
MCH: 29 pg (ref 26.0–34.0)
MCHC: 33.2 g/dL (ref 30.0–36.0)
MCV: 87.6 fL (ref 78.0–100.0)
Platelets: 271 10*3/uL (ref 150–400)
RBC: 4.2 MIL/uL (ref 3.87–5.11)
RDW: 14.9 % (ref 11.5–15.5)
WBC: 4.9 10*3/uL (ref 4.0–10.5)

## 2015-12-02 LAB — COMPREHENSIVE METABOLIC PANEL
ALBUMIN: 4.5 g/dL (ref 3.5–5.0)
ALT: 11 U/L — ABNORMAL LOW (ref 14–54)
ANION GAP: 8 (ref 5–15)
AST: 18 U/L (ref 15–41)
Alkaline Phosphatase: 115 U/L (ref 38–126)
BILIRUBIN TOTAL: 1.1 mg/dL (ref 0.3–1.2)
BUN: 14 mg/dL (ref 6–20)
CHLORIDE: 104 mmol/L (ref 101–111)
CO2: 27 mmol/L (ref 22–32)
Calcium: 9.4 mg/dL (ref 8.9–10.3)
Creatinine, Ser: 0.74 mg/dL (ref 0.44–1.00)
GFR calc Af Amer: >60 mL/min (ref >60)
GFR calc non Af Amer: >60 mL/min (ref >60)
GLUCOSE: 94 mg/dL (ref 65–99)
POTASSIUM: 4.1 mmol/L (ref 3.5–5.1)
SODIUM: 139 mmol/L (ref 135–145)
TOTAL PROTEIN: 7.9 g/dL (ref 6.5–8.1)

## 2015-12-02 LAB — URINE MICROSCOPIC-ADD ON: RBC / HPF: NONE SEEN RBC/hpf (ref 0–5)

## 2015-12-02 LAB — LIPASE, BLOOD: LIPASE: 21 U/L (ref 11–51)

## 2015-12-02 MED ORDER — IOPAMIDOL (ISOVUE-300) INJECTION 61%
100.0000 mL | Freq: Once | INTRAVENOUS | Status: AC | PRN
  Administered 2015-12-02: 100 mL via INTRAVENOUS

## 2015-12-02 MED ORDER — SODIUM CHLORIDE 0.9 % IV BOLUS (SEPSIS)
1000.0000 mL | INTRAVENOUS | Status: AC
  Administered 2015-12-02: 1000 mL via INTRAVENOUS

## 2015-12-02 MED ORDER — KETOROLAC TROMETHAMINE 30 MG/ML IJ SOLN
30.0000 mg | Freq: Once | INTRAMUSCULAR | Status: AC
  Administered 2015-12-02: 30 mg via INTRAVENOUS
  Filled 2015-12-02: qty 1

## 2015-12-02 MED ORDER — ONDANSETRON HCL 4 MG/2ML IJ SOLN
4.0000 mg | Freq: Once | INTRAMUSCULAR | Status: AC
  Administered 2015-12-02: 4 mg via INTRAVENOUS
  Filled 2015-12-02: qty 2

## 2015-12-02 NOTE — ED Notes (Signed)
Pt states pain had decreased but has now returned to a 6/10, pt requesting addt pain medication.

## 2015-12-02 NOTE — ED Provider Notes (Signed)
[facility] DEPT Provider Note   CSN: [id number] Arrival date & time: 12/02/15  1858  By signing my name below, I, Michaela Little, attest that this documentation has been prepared under the direction and in the presence of [employer], PA-C.  Electronically Signed: Reola Little, ED Scribe. 12/02/15. 8:22 PM.  History   Chief Complaint Chief Complaint  Patient presents with  . Groin Pain   The history is provided by the patient. No language interpreter was used.   HPI Comments: Michaela Little is a 55 y.o. female with a PMHx of breast cancer w/ metastases to the bone currently undergoing chemotherapy, who presents to the Emergency Department complaining of intermittent episodes of diarrhea x 1 week. Pt reports associated nausea, vomiting (x3 episodes today), urinary urgency, mild dysuria, fatigue, and chills over the past several days. Pt further reports that she has experienced intermittent, sharp bilateral groin pain x 2-3 days. No recent new medication changes or infusions for her hx of CA. Pt is taking Letrozole daily for her chemotherapy. She is unable to tolerate food or fluids other than water currently. Pt has had a hysterectomy and cholecystectomy to the abdomen in the past. Denies fever, hematuria, bloody stools, or any other associated symptoms.    Past Medical History:  Diagnosis Date  . Anxiety   . Arthritis    hands, knees, hips  . Asthma   . Cancer ([place])   . Cervical stenosis (uterine cervix)   . Dyspnea on exertion   . GERD (gastroesophageal reflux disease)   . Headache(784.0)   . History of breast cancer    2010--  LEFT  s/p  mastectomy (in [place]) AND CHEMORADIATION--  NO RECURRENCE  . History of cervical dysplasia   . History of TB skin testing    AS TEEN--  [place] W/ MEDS  . Hyperlipidemia   . Hypertension   . OSA (obstructive sleep apnea) moderate osa per study  09/2010   CPAP  , NOT USING ON REGULAR BASIS  . Pelvic pain in female   . Positive H.  pylori test    08-05-2013  . Refusal of blood transfusions as patient is Jehovah's Witness   . Seasonal allergies   . Uterine fibroid   . Wears glasses    Patient Active Problem List   Diagnosis Date Noted  . Bone metastases ([place]) 11/13/2015  . FH: TAH-BSO (total abdominal hysterectomy and bilateral salpingo-oophorectomy) 10/27/2014  . Right thyroid nodule 06/30/2014  . Allergic rhinitis due to pollen 06/30/2014  . Essential hypertension 12/24/2013  . Left ventricular diastolic dysfunction with preserved systolic function ([place]) [id number]  . Neck nodule 08/11/2013  . Abdominal pain, chronic, bilateral lower quadrant 08/05/2013  . Abdominal pain, epigastric 08/05/2013  . Severe obesity (BMI >= 40) ([place]) 06/23/2013  . Allergic rhinitis 06/23/2013  . GERD (gastroesophageal reflux disease) 03/25/2013  . Constipation 03/25/2013  . Other and unspecified hyperlipidemia 03/25/2013  . Dysuria 03/25/2013  . Routine general medical examination at a health care facility 03/25/2013  . Cancer of overlapping sites of left female breast ([place]) 03/05/2013  . Cough 10/28/2012  . Dyspnea 10/28/2012  . Chronic abdominal pain 09/02/2012  . Pelvic pain in female 02/06/2012  . Atypical chest pain 11/13/2011  . Hyperlipidemia 11/13/2011  . Dysplasia of cervix, low grade (CIN 1) 08/22/2011  . Diastolic dysfunction [id number]  . Influenza 03/04/2011  . Hypertension 03/04/2011  . Obesity 03/04/2011  . OSA (obstructive sleep apnea) 02/27/2011  . Asthma 02/08/2011  Past Surgical History:  Procedure Laterality Date  . CARDIOVASCULAR STRESS TEST  10-29-2012   low risk perfusion study/  no significant reversibity/ ef 66%/  normal wall motion  . CERVICAL CONIZATION W/BX  2012   in  [place] N/A 05/14/2012   Procedure: LAPAROSCOPIC CHOLECYSTECTOMY;  Surgeon: Ralene Ok, MD;  Location: [facility];  Service: General;  Laterality: N/A;  . COLONOSCOPY  2011   normal per patient - [place]  .  DILATION AND CURETTAGE OF UTERUS  10/15/2011   Procedure: DILATATION AND CURETTAGE;  Surgeon: Melina Schools, MD;  Location: [facility];  Service: Gynecology;  Laterality: N/A;  Conization &  endocervical curettings  . EXAMINATION UNDER ANESTHESIA N/A 08/20/2013   Procedure: EXAM UNDER ANESTHESIA;  Surgeon: Margarette Asal, MD;  Location: [facility];  Service: Gynecology;  Laterality: N/A;  . [place]   right  . LAPAROSCOPIC ASSISTED VAGINAL HYSTERECTOMY N/A 10/26/2014   Procedure: HYSTERECTOMY ABDOMINAL ;  Surgeon: Molli Posey, MD;  Location: [facility];  Service: Gynecology;  Laterality: N/A;  . MASTECTOMY Left 11/2008  in [place]   . SALPINGOOPHORECTOMY Bilateral 10/26/2014   Procedure: BILATERAL SALPINGO OOPHORECTOMY;  Surgeon: Molli Posey, MD;  Location: [facility];  Service: Gynecology;  Laterality: Bilateral;  . TRANSTHORACIC ECHOCARDIOGRAM  10-29-2012   mild lvh/  ef 60-65%/  grade II diastolic dysfunction/  trivial mr  &  tr    OB History    Gravida Para Term Preterm AB Living   10 5 5   5 5    SAB TAB Ectopic Multiple Live Births   3 2           Home Medications    Prior to Admission medications   Medication Sig Start Date End Date Taking? Authorizing Provider  diphenhydrAMINE (BENADRYL) 25 mg capsule Take 25 mg by mouth every 6 (six) hours as needed for allergies.   Yes Historical Provider, MD  letrozole (FEMARA) 2.5 MG tablet Take 1 tablet (2.5 mg total) by mouth daily. 10/17/15  Yes Chauncey Cruel, MD  loratadine (CLARITIN) 10 MG tablet Take 1 tablet (10 mg total) by mouth daily. 11/21/15  Yes Lauree Chandler, NP  metoprolol tartrate (LOPRESSOR) 25 MG tablet Take 25 mg by mouth 2 (two) times daily. 09/24/15  Yes Historical Provider, MD  NONFORMULARY OR COMPOUNDED ITEM APPLY 1-2 GRAMS FOUR TIMES DAILY TO AFFECTED AREA(S)  -GABAPENTIN(T) 6% DICLOFENAC(T) 5% LIDOCAINE 5% DMSO 5% CRM 09/30/15  Yes Historical Provider, MD    Pseudoeph-Doxylamine-DM-APAP (NYQUIL PO) Take 30 mLs by mouth once as needed (cough/cold symptoms.).   Yes Historical Provider, MD  cephALEXin (KEFLEX) 500 MG capsule Take 1 capsule (500 mg total) by mouth 2 (two) times daily. 12/03/15   Antonietta Breach, PA-C  dicyclomine (BENTYL) 20 MG tablet Take 1 tablet (20 mg total) by mouth 2 (two) times daily. Take, as needed, for abdominal pain and cramping 12/03/15   Antonietta Breach, PA-C  docusate sodium (COLACE) 100 MG capsule Take 1 capsule (100 mg total) by mouth every 12 (twelve) hours. Patient not taking: Reported on 12/02/2015 11/03/14   West Pugh, NP  IBRANCE 100 MG capsule TAKE 1 CAPSULE BY MOUTH DAILY FOR 21 DAYS THEN 7 DAYS REST. TAKE WHOLE WITH FOOD. Patient not taking: Reported on 12/02/2015 12/02/15   Chauncey Cruel, MD  phenazopyridine (PYRIDIUM) 200 MG tablet Take 1 tablet (200 mg total) by mouth 3 (three) times  daily. 12/03/15   Antonietta Breach, PA-C  promethazine (PHENERGAN) 25 MG tablet Take 1 tablet (25 mg total) by mouth every 6 (six) hours as needed for nausea or vomiting. 12/03/15   Antonietta Breach, PA-C   Family History Family History  Problem Relation Age of Onset  . Breast cancer Mother   . Colon cancer Mother   . Hypotension Mother   . Asthma Mother   . Diabetes type II Mother   . Arthritis Mother   . Clotting disorder Mother   . Cancer Mother     breast/colon  . Mental illness Brother   . Heart disease Brother   . Cerebral palsy Daughter   . Emphysema Brother     never smoker  . Colon cancer Maternal Aunt   . Cancer Maternal Aunt     colon  . Colon cancer Maternal Uncle   . Cancer Maternal Uncle     colon  . Esophageal cancer Neg Hx   . Rectal cancer Neg Hx   . Stomach cancer Neg Hx   . Thyroid disease Neg Hx    Social History Social History  Substance Use Topics  . Smoking status: Former Smoker    Packs/day: 0.30    Years: 10.00    Types: Cigarettes    Quit date: 03/26/1988  . Smokeless tobacco: Never Used  . Alcohol  use No   Allergies   Lisinopril-hydrochlorothiazide; Adhesive [tape]; Calcium; Calcium carbonate antacid; Hctz [hydrochlorothiazide]; Latex; Lisinopril; and Losartan potassium  Review of Systems Review of Systems  Constitutional: Positive for chills and fatigue. Negative for fever.  Gastrointestinal: Positive for diarrhea, nausea and vomiting. Negative for blood in stool and constipation.  Genitourinary: Positive for dysuria and urgency. Negative for hematuria.  A complete 10 system review of systems was obtained and all systems are negative except as noted in the HPI and PMH.    Physical Exam Updated Vital Signs BP 121/72 (BP Location: Right Arm)   Pulse 60   Temp 97.9 F (36.6 C) (Oral)   Resp 16   LMP 02/25/2009   SpO2 98%   Physical Exam  Constitutional: She is oriented to person, place, and time. She appears well-developed and well-nourished. No distress.  Nontoxic appearing and in no distress  HENT:  Head: Normocephalic and atraumatic.  Eyes: Conjunctivae and EOM are normal. No scleral icterus.  Neck: Normal range of motion.  Cardiovascular: Normal rate, regular rhythm and intact distal pulses.   Pulmonary/Chest: Effort normal. No respiratory distress.  Respirations even and unlabored. Lungs clear to auscultation bilaterally.  Abdominal: Soft.  Soft, obese abdomen. There is generalized tenderness to palpation. No focal tenderness or masses. Exam slightly limited secondary to body habitus.  Musculoskeletal: Normal range of motion.  Neurological: She is alert and oriented to person, place, and time.  Patient moving all extremities  Skin: Skin is warm and dry. No rash noted. She is not diaphoretic. No erythema. No pallor.  Psychiatric: She has a normal mood and affect. Her behavior is normal.  Nursing note and vitals reviewed.   ED Treatments / Results  DIAGNOSTIC STUDIES: Oxygen Saturation is 100% on RA, normal by my interpretation.   COORDINATION OF CARE: 8:22  PM-Discussed next steps with pt. Pt verbalized understanding and is agreeable with the plan.   Labs (all labs ordered are listed, but only abnormal results are displayed) Labs Reviewed  COMPREHENSIVE METABOLIC PANEL - Abnormal; Notable for the following:       Result Value   ALT  11 (*)    All other components within normal limits  URINALYSIS, ROUTINE W REFLEX MICROSCOPIC (NOT AT [facility]) - Abnormal; Notable for the following:    APPearance CLOUDY (*)    Specific Gravity, Urine 1.031 (*)    Bilirubin Urine SMALL (*)    Leukocytes, UA SMALL (*)    All other components within normal limits  URINE MICROSCOPIC-ADD ON - Abnormal; Notable for the following:    Squamous Epithelial / LPF 0-5 (*)    Bacteria, UA FEW (*)    All other components within normal limits  CARBOXYHEMOGLOBIN - Abnormal; Notable for the following:    Total hemoglobin 11.7 (*)    All other components within normal limits  URINE CULTURE  LIPASE, BLOOD  CBC    EKG  EKG Interpretation None       Radiology Ct Abdomen Pelvis W Contrast  Result Date: 12/02/2015 CLINICAL DATA:  Acute onset of bilateral groin pain, with nausea, vomiting and diarrhea. Dysuria. Initial encounter. EXAM: CT ABDOMEN AND PELVIS WITH CONTRAST TECHNIQUE: Multidetector CT imaging of the abdomen and pelvis was performed using the standard protocol following bolus administration of intravenous contrast. CONTRAST:  179mL ISOVUE-300 IOPAMIDOL (ISOVUE-300) INJECTION 61% COMPARISON:  CT of the abdomen and pelvis performed 07/30/2013 FINDINGS: Lower chest: Minimal bibasilar atelectasis is noted. The visualized portions of the mediastinum are unremarkable. The left breast implant is partially imaged. Hepatobiliary: A tiny hypodensity within the liver may reflect a tiny cyst. The liver is otherwise unremarkable. The patient is status post cholecystectomy. The common bile duct is normal in caliber, status post cholecystectomy. Pancreas: The pancreas is grossly  unremarkable in appearance. Spleen: The spleen is unremarkable. Adrenals/Urinary Tract: The adrenal glands are unremarkable in appearance. A small right renal cyst is noted. The kidneys are otherwise unremarkable. There is no evidence of hydronephrosis. No renal or ureteral stones are identified. No perinephric stranding is appreciated. Stomach/Bowel: The stomach is grossly unremarkable in appearance. The small bowel is within normal limits. The appendix remains normal in caliber, without evidence of appendicitis. The colon is unremarkable in appearance. Vascular/Lymphatic: Minimal calcification is noted at the aortic bifurcation. The abdominal aorta is grossly unremarkable. No retroperitoneal lymphadenopathy is seen. No pelvic sidewall lymphadenopathy is identified. Reproductive: The bladder is decompressed. Mild soft tissue inflammation is noted about the bladder, concerning for cystitis. The patient is status post hysterectomy. No suspicious adnexal masses are seen. Other: No additional abnormalities are seen with regard to the soft tissues. Musculoskeletal: No acute osseous abnormalities are identified. Mild facet disease is noted at the lower lumbar spine. IMPRESSION: 1. Mild soft tissue inflammation about the bladder is concerning for cystitis. 2. Question of tiny hepatic cyst.  Small right renal cyst noted. Electronically Signed   By: Garald Balding M.D.   On: 12/02/2015 22:41    Procedures Procedures (including critical care time)  Medications Ordered in ED Medications  sodium chloride 0.9 % bolus 1,000 mL (0 mLs Intravenous Stopped 12/03/15 0243)  ondansetron (ZOFRAN) injection 4 mg (4 mg Intravenous Given 12/02/15 2115)  ketorolac (TORADOL) 30 MG/ML injection 30 mg (30 mg Intravenous Given 12/02/15 2115)  iopamidol (ISOVUE-300) 61 % injection 100 mL (100 mLs Intravenous Contrast Given 12/02/15 2154)  morphine 4 MG/ML injection 4 mg (4 mg Intravenous Given 12/03/15 0124)  phenazopyridine (PYRIDIUM)  tablet 100 mg (100 mg Oral Given 12/03/15 0128)  metoCLOPramide (REGLAN) injection 10 mg (10 mg Intravenous Given 12/03/15 0124)  cephALEXin (KEFLEX) capsule 500 mg (500 mg Oral Given 12/03/15  0128)    Initial Impression / Assessment and Plan / ED Course  I have reviewed the triage vital signs and the nursing notes.  Pertinent labs & imaging results that were available during my care of the patient were reviewed by me and considered in my medical decision making (see chart for details).  Clinical Course    55 year old female with a history of breast cancer with metastasis to the bone presents to the emergency department for vomiting and diarrhea. Diarrhea has been intermittent over the past week. Vomiting aggravated today with a total of 3 episodes. Symptoms consistent with viral gastroenteritis. Patient has no leukocytosis or fever. Her CT scan does not show any evidence of colitis, bowel obstruction, or other emergent process. There is some bladder wall thickening and patient does complain of some urinary urgency and mild dysuria. Plan to treat for a urinary tract infection; however, suspect that symptoms may be secondary to interstitial cystitis. Patient states that she has had similar symptoms for years and has been treated for urinary tract infection multiple times. Will refer to urology for this.  When attending to discharge the patient, she reports looking up symptoms of carbon monoxide poisoning. She expresses concern because her carbon monoxide alarm went off a few days ago. She thinks that her symptoms may be due to this. A carboxyhemoglobin was checked for this reason and was found to be normal. I have reassured the patient that her symptoms are likely due to a viral process which should resolve in the next few days. She has been instructed to follow-up with her primary care doctor for reevaluation. Return precautions discussed and provided. Patient discharged in satisfactory condition with no  unaddressed concerns.   Vitals:   12/02/15 2053 12/02/15 2308 12/03/15 0020 12/03/15 0245  BP: 159/92 156/85 172/92 121/72  Pulse: 67 63 65 60  Resp: 18 16 18 16   Temp:    97.9 F (36.6 C)  TempSrc:    Oral  SpO2: 99% 99% 99% 98%    Final Clinical Impressions(s) / ED Diagnoses   Final diagnoses:  Lower abdominal pain  UTI (lower urinary tract infection)    New Prescriptions Discharge Medication List as of 12/03/2015  1:32 AM    START taking these medications   Details  cephALEXin (KEFLEX) 500 MG capsule Take 1 capsule (500 mg total) by mouth 2 (two) times daily., Starting Sat 12/03/2015, Print    dicyclomine (BENTYL) 20 MG tablet Take 1 tablet (20 mg total) by mouth 2 (two) times daily. Take, as needed, for abdominal pain and cramping, Starting Sat 12/03/2015, Print    phenazopyridine (PYRIDIUM) 200 MG tablet Take 1 tablet (200 mg total) by mouth 3 (three) times daily., Starting Sat 12/03/2015, Print    promethazine (PHENERGAN) 25 MG tablet Take 1 tablet (25 mg total) by mouth every 6 (six) hours as needed for nausea or vomiting., Starting Sat 12/03/2015, Print        I personally performed the services described in this documentation, which was scribed in my presence. The recorded information has been reviewed and is accurate.       Antonietta Breach, PA-C 12/03/15 [id number]    Forde Dandy, MD 12/03/15 [phone]

## 2015-12-02 NOTE — ED Triage Notes (Addendum)
Pt complaint of bilateral groin pain with associated n/v/d and dysuria for a week. Pt currently receiving chemotherapy for breast cancer.

## 2015-12-03 DIAGNOSIS — N390 Urinary tract infection, site not specified: Secondary | ICD-10-CM | POA: Diagnosis not present

## 2015-12-03 LAB — CARBOXYHEMOGLOBIN
CARBOXYHEMOGLOBIN: 1.1 % (ref 0.5–1.5)
Methemoglobin: 0.9 % (ref 0.0–1.5)
O2 SAT: 68.5 %
TOTAL HEMOGLOBIN: 11.7 g/dL — AB (ref 12.0–16.0)

## 2015-12-03 MED ORDER — METOCLOPRAMIDE HCL 5 MG/ML IJ SOLN
10.0000 mg | Freq: Once | INTRAMUSCULAR | Status: AC
  Administered 2015-12-03: 10 mg via INTRAVENOUS
  Filled 2015-12-03: qty 2

## 2015-12-03 MED ORDER — CEPHALEXIN 500 MG PO CAPS: 500.0000 mg | ORAL_CAPSULE | Freq: Two times a day (BID) | ORAL | 0 refills | Status: DC

## 2015-12-03 MED ORDER — PROMETHAZINE HCL 25 MG PO TABS: 25.0000 mg | ORAL_TABLET | Freq: Four times a day (QID) | ORAL | 0 refills | Status: DC | PRN

## 2015-12-03 MED ORDER — CEPHALEXIN 500 MG PO CAPS
500.0000 mg | ORAL_CAPSULE | Freq: Once | ORAL | Status: AC
  Administered 2015-12-03: 500 mg via ORAL
  Filled 2015-12-03: qty 1

## 2015-12-03 MED ORDER — PHENAZOPYRIDINE HCL 200 MG PO TABS: 200.0000 mg | ORAL_TABLET | Freq: Three times a day (TID) | ORAL | 0 refills | Status: DC

## 2015-12-03 MED ORDER — DICYCLOMINE HCL 20 MG PO TABS: 20.0000 mg | ORAL_TABLET | Freq: Two times a day (BID) | ORAL | 0 refills | Status: DC

## 2015-12-03 MED ORDER — PHENAZOPYRIDINE HCL 100 MG PO TABS
100.0000 mg | ORAL_TABLET | Freq: Once | ORAL | Status: AC
  Administered 2015-12-03: 100 mg via ORAL
  Filled 2015-12-03: qty 1

## 2015-12-03 MED ORDER — MORPHINE SULFATE (PF) 4 MG/ML IV SOLN
4.0000 mg | Freq: Once | INTRAVENOUS | Status: AC
  Administered 2015-12-03: 4 mg via INTRAVENOUS
  Filled 2015-12-03: qty 1

## 2015-12-03 NOTE — ED Notes (Signed)
Pt c/o pain and nausea post fluid challenge

## 2015-12-03 NOTE — Discharge Instructions (Signed)
Take Keflex as prescribed for a urinary tract infection. You may take Pyridium for urinary frequency or urgency or pressure when urinating. Take Bentyl as needed for pain and phenergan as needed for nausea/vomiting. Follow up with your primary care doctor regarding your ED visit today. Return to the ED for any new or concerning symptoms.

## 2015-12-04 LAB — URINE CULTURE

## 2015-12-14 ENCOUNTER — Other Ambulatory Visit (HOSPITAL_BASED_OUTPATIENT_CLINIC_OR_DEPARTMENT_OTHER): Payer: Medicare Other

## 2015-12-14 ENCOUNTER — Telehealth: Payer: Self-pay | Admitting: *Deleted

## 2015-12-14 ENCOUNTER — Ambulatory Visit (HOSPITAL_BASED_OUTPATIENT_CLINIC_OR_DEPARTMENT_OTHER): Payer: Medicare Other

## 2015-12-14 VITALS — BP 160/80 | HR 87 | Temp 98.3°F

## 2015-12-14 DIAGNOSIS — C50812 Malignant neoplasm of overlapping sites of left female breast: Secondary | ICD-10-CM

## 2015-12-14 DIAGNOSIS — C7951 Secondary malignant neoplasm of bone: Secondary | ICD-10-CM | POA: Diagnosis not present

## 2015-12-14 DIAGNOSIS — C50912 Malignant neoplasm of unspecified site of left female breast: Secondary | ICD-10-CM | POA: Diagnosis present

## 2015-12-14 LAB — COMPREHENSIVE METABOLIC PANEL
ALT: 13 U/L (ref 0–55)
ANION GAP: 9 meq/L (ref 3–11)
AST: 17 U/L (ref 5–34)
Albumin: 3.5 g/dL (ref 3.5–5.0)
Alkaline Phosphatase: 132 U/L (ref 40–150)
BUN: 8.6 mg/dL (ref 7.0–26.0)
CALCIUM: 9.5 mg/dL (ref 8.4–10.4)
CHLORIDE: 108 meq/L (ref 98–109)
CO2: 25 mEq/L (ref 22–29)
Creatinine: 0.8 mg/dL (ref 0.6–1.1)
Glucose: 96 mg/dl (ref 70–140)
POTASSIUM: 4.2 meq/L (ref 3.5–5.1)
Sodium: 142 mEq/L (ref 136–145)
Total Bilirubin: 0.9 mg/dL (ref 0.20–1.20)
Total Protein: 7.3 g/dL (ref 6.4–8.3)

## 2015-12-14 LAB — CBC WITH DIFFERENTIAL/PLATELET
BASO%: 0.2 % (ref 0.0–2.0)
Basophils Absolute: 0 10*3/uL (ref 0.0–0.1)
EOS ABS: 0.6 10*3/uL — AB (ref 0.0–0.5)
EOS%: 14 % — ABNORMAL HIGH (ref 0.0–7.0)
HEMATOCRIT: 35 % (ref 34.8–46.6)
HEMOGLOBIN: 11.6 g/dL (ref 11.6–15.9)
LYMPH#: 0.9 10*3/uL (ref 0.9–3.3)
LYMPH%: 20.2 % (ref 14.0–49.7)
MCH: 28.6 pg (ref 25.1–34.0)
MCHC: 33.1 g/dL (ref 31.5–36.0)
MCV: 86.4 fL (ref 79.5–101.0)
MONO#: 0.3 10*3/uL (ref 0.1–0.9)
MONO%: 7.3 % (ref 0.0–14.0)
NEUT%: 58.3 % (ref 38.4–76.8)
NEUTROS ABS: 2.6 10*3/uL (ref 1.5–6.5)
Platelets: 198 10*3/uL (ref 145–400)
RBC: 4.05 10*6/uL (ref 3.70–5.45)
RDW: 14.7 % — AB (ref 11.2–14.5)
WBC: 4.5 10*3/uL (ref 3.9–10.3)

## 2015-12-14 MED ORDER — DENOSUMAB 120 MG/1.7ML ~~LOC~~ SOLN
120.0000 mg | Freq: Once | SUBCUTANEOUS | Status: AC
  Administered 2015-12-14: 120 mg via SUBCUTANEOUS
  Filled 2015-12-14: qty 1.7

## 2015-12-14 NOTE — Telephone Encounter (Addendum)
Pt called to inquire about restart of Ibrance 100 mg a day.   [place] per lab 9/19 was 2.6.  Pt will restart next cycle today with stop date of 01/03/2016.  Pt is scheduled for lab and MD follow up on 10/18 when she is to start another cycle.

## 2015-12-14 NOTE — Patient Instructions (Signed)
Denosumab injection  What is this medicine?  DENOSUMAB (den oh sue mab) slows bone breakdown. Prolia is used to treat osteoporosis in women after menopause and in men. Xgeva is used to prevent bone fractures and other bone problems caused by cancer bone metastases. Xgeva is also used to treat giant cell tumor of the bone.  This medicine may be used for other purposes; ask your health care provider or pharmacist if you have questions.  What should I tell my health care provider before I take this medicine?  They need to know if you have any of these conditions:  -dental disease  -eczema  -infection or history of infections  -kidney disease or on dialysis  -low blood calcium or vitamin D  -malabsorption syndrome  -scheduled to have surgery or tooth extraction  -taking medicine that contains denosumab  -thyroid or parathyroid disease  -an unusual reaction to denosumab, other medicines, foods, dyes, or preservatives  -pregnant or trying to get pregnant  -breast-feeding  How should I use this medicine?  This medicine is for injection under the skin. It is given by a health care professional in a hospital or clinic setting.  If you are getting Prolia, a special MedGuide will be given to you by the pharmacist with each prescription and refill. Be sure to read this information carefully each time.  For Prolia, talk to your pediatrician regarding the use of this medicine in children. Special care may be needed. For Xgeva, talk to your pediatrician regarding the use of this medicine in children. While this drug may be prescribed for children as young as 13 years for selected conditions, precautions do apply.  Overdosage: If you think you have taken too much of this medicine contact a poison control center or emergency room at once.  NOTE: This medicine is only for you. Do not share this medicine with others.  What if I miss a dose?  It is important not to miss your dose. Call your doctor or health care professional if you are  unable to keep an appointment.  What may interact with this medicine?  Do not take this medicine with any of the following medications:  -other medicines containing denosumab  This medicine may also interact with the following medications:  -medicines that suppress the immune system  -medicines that treat cancer  -steroid medicines like prednisone or cortisone  This list may not describe all possible interactions. Give your health care provider a list of all the medicines, herbs, non-prescription drugs, or dietary supplements you use. Also tell them if you smoke, drink alcohol, or use illegal drugs. Some items may interact with your medicine.  What should I watch for while using this medicine?  Visit your doctor or health care professional for regular checks on your progress. Your doctor or health care professional may order blood tests and other tests to see how you are doing.  Call your doctor or health care professional if you get a cold or other infection while receiving this medicine. Do not treat yourself. This medicine may decrease your body's ability to fight infection.  You should make sure you get enough calcium and vitamin D while you are taking this medicine, unless your doctor tells you not to. Discuss the foods you eat and the vitamins you take with your health care professional.  See your dentist regularly. Brush and floss your teeth as directed. Before you have any dental work done, tell your dentist you are receiving this medicine.  Do   not become pregnant while taking this medicine or for 5 months after stopping it. Women should inform their doctor if they wish to become pregnant or think they might be pregnant. There is a potential for serious side effects to an unborn child. Talk to your health care professional or pharmacist for more information.  What side effects may I notice from receiving this medicine?  Side effects that you should report to your doctor or health care professional as soon as  possible:  -allergic reactions like skin rash, itching or hives, swelling of the face, lips, or tongue  -breathing problems  -chest pain  -fast, irregular heartbeat  -feeling faint or lightheaded, falls  -fever, chills, or any other sign of infection  -muscle spasms, tightening, or twitches  -numbness or tingling  -skin blisters or bumps, or is dry, peels, or red  -slow healing or unexplained pain in the mouth or jaw  -unusual bleeding or bruising  Side effects that usually do not require medical attention (Report these to your doctor or health care professional if they continue or are bothersome.):  -muscle pain  -stomach upset, gas  This list may not describe all possible side effects. Call your doctor for medical advice about side effects. You may report side effects to FDA at 1-800-FDA-1088.  Where should I keep my medicine?  This medicine is only given in a clinic, doctor's office, or other health care setting and will not be stored at home.  NOTE: This sheet is a summary. It may not cover all possible information. If you have questions about this medicine, talk to your doctor, pharmacist, or health care provider.      2016, [employer]vier/Gold Standard. (2011-09-10 12:37:47)

## 2015-12-22 ENCOUNTER — Telehealth: Payer: Self-pay

## 2015-12-22 NOTE — Telephone Encounter (Signed)
Geoffery Spruce with [facility] called stating they need new Rx for pt's Ibrance. There are no refills left.

## 2015-12-26 ENCOUNTER — Other Ambulatory Visit: Payer: Self-pay | Admitting: *Deleted

## 2015-12-26 DIAGNOSIS — C50919 Malignant neoplasm of unspecified site of unspecified female breast: Secondary | ICD-10-CM | POA: Diagnosis not present

## 2015-12-26 MED ORDER — PALBOCICLIB 100 MG PO CAPS: ORAL_CAPSULE | ORAL | 0 refills | Status: DC

## 2016-01-06 ENCOUNTER — Other Ambulatory Visit: Payer: Self-pay | Admitting: Oncology

## 2016-01-11 ENCOUNTER — Ambulatory Visit (HOSPITAL_BASED_OUTPATIENT_CLINIC_OR_DEPARTMENT_OTHER): Payer: Medicare Other

## 2016-01-11 ENCOUNTER — Other Ambulatory Visit (HOSPITAL_BASED_OUTPATIENT_CLINIC_OR_DEPARTMENT_OTHER): Payer: Medicare Other

## 2016-01-11 ENCOUNTER — Ambulatory Visit: Payer: Medicare Other

## 2016-01-11 ENCOUNTER — Ambulatory Visit (HOSPITAL_BASED_OUTPATIENT_CLINIC_OR_DEPARTMENT_OTHER): Payer: Medicare Other | Admitting: Oncology

## 2016-01-11 VITALS — BP 164/78 | HR 67 | Temp 98.1°F | Resp 18 | Ht 67.0 in | Wt 269.7 lb

## 2016-01-11 DIAGNOSIS — C50812 Malignant neoplasm of overlapping sites of left female breast: Secondary | ICD-10-CM | POA: Diagnosis not present

## 2016-01-11 DIAGNOSIS — C7951 Secondary malignant neoplasm of bone: Secondary | ICD-10-CM

## 2016-01-11 DIAGNOSIS — R300 Dysuria: Secondary | ICD-10-CM

## 2016-01-11 DIAGNOSIS — I890 Lymphedema, not elsewhere classified: Secondary | ICD-10-CM

## 2016-01-11 DIAGNOSIS — Z170 Estrogen receptor positive status [ER+]: Principal | ICD-10-CM

## 2016-01-11 DIAGNOSIS — R109 Unspecified abdominal pain: Secondary | ICD-10-CM | POA: Diagnosis not present

## 2016-01-11 LAB — URINALYSIS, MICROSCOPIC - CHCC
BILIRUBIN (URINE): NEGATIVE
BLOOD: NEGATIVE
GLUCOSE UR CHCC: NEGATIVE mg/dL
KETONES: NEGATIVE mg/dL
NITRITE: NEGATIVE
PH: 6 (ref 4.6–8.0)
Protein: NEGATIVE mg/dL
Specific Gravity, Urine: 1.025 (ref 1.003–1.035)
Urobilinogen, UR: 0.2 mg/dL (ref 0.2–1)

## 2016-01-11 LAB — COMPREHENSIVE METABOLIC PANEL
ALT: 7 U/L (ref 0–55)
AST: 15 U/L (ref 5–34)
Albumin: 3.5 g/dL (ref 3.5–5.0)
Alkaline Phosphatase: 122 U/L (ref 40–150)
Anion Gap: 7 mEq/L (ref 3–11)
BUN: 7.9 mg/dL (ref 7.0–26.0)
CALCIUM: 8.6 mg/dL (ref 8.4–10.4)
CHLORIDE: 110 meq/L — AB (ref 98–109)
CO2: 24 meq/L (ref 22–29)
CREATININE: 0.7 mg/dL (ref 0.6–1.1)
EGFR: >90 mL/min/{1.73_m2} (ref >90)
GLUCOSE: 131 mg/dL (ref 70–140)
Potassium: 4 mEq/L (ref 3.5–5.1)
SODIUM: 141 meq/L (ref 136–145)
Total Bilirubin: 0.94 mg/dL (ref 0.20–1.20)
Total Protein: 7 g/dL (ref 6.4–8.3)

## 2016-01-11 LAB — CBC WITH DIFFERENTIAL/PLATELET
BASO%: 0.9 % (ref 0.0–2.0)
BASOS ABS: 0 10*3/uL (ref 0.0–0.1)
EOS ABS: 0.1 10*3/uL (ref 0.0–0.5)
EOS%: 4.3 % (ref 0.0–7.0)
HCT: 35.1 % (ref 34.8–46.6)
HGB: 11.7 g/dL (ref 11.6–15.9)
LYMPH%: 34 % (ref 14.0–49.7)
MCH: 28.8 pg (ref 25.1–34.0)
MCHC: 33.3 g/dL (ref 31.5–36.0)
MCV: 86.5 fL (ref 79.5–101.0)
MONO#: 0.2 10*3/uL (ref 0.1–0.9)
MONO%: 8.5 % (ref 0.0–14.0)
NEUT#: 1.2 10*3/uL — ABNORMAL LOW (ref 1.5–6.5)
NEUT%: 52.3 % (ref 38.4–76.8)
PLATELETS: 190 10*3/uL (ref 145–400)
RBC: 4.06 10*6/uL (ref 3.70–5.45)
RDW: 15.9 % — ABNORMAL HIGH (ref 11.2–14.5)
WBC: 2.4 10*3/uL — ABNORMAL LOW (ref 3.9–10.3)
lymph#: 0.8 10*3/uL — ABNORMAL LOW (ref 0.9–3.3)

## 2016-01-11 MED ORDER — DENOSUMAB 120 MG/1.7ML ~~LOC~~ SOLN
120.0000 mg | Freq: Once | SUBCUTANEOUS | Status: AC
  Administered 2016-01-11: 120 mg via SUBCUTANEOUS
  Filled 2016-01-11: qty 1.7

## 2016-01-11 NOTE — Patient Instructions (Signed)
Denosumab injection  What is this medicine?  DENOSUMAB (den oh sue mab) slows bone breakdown. Prolia is used to treat osteoporosis in women after menopause and in men. Xgeva is used to prevent bone fractures and other bone problems caused by cancer bone metastases. Xgeva is also used to treat giant cell tumor of the bone.  This medicine may be used for other purposes; ask your health care provider or pharmacist if you have questions.  What should I tell my health care provider before I take this medicine?  They need to know if you have any of these conditions:  -dental disease  -eczema  -infection or history of infections  -kidney disease or on dialysis  -low blood calcium or vitamin D  -malabsorption syndrome  -scheduled to have surgery or tooth extraction  -taking medicine that contains denosumab  -thyroid or parathyroid disease  -an unusual reaction to denosumab, other medicines, foods, dyes, or preservatives  -pregnant or trying to get pregnant  -breast-feeding  How should I use this medicine?  This medicine is for injection under the skin. It is given by a health care professional in a hospital or clinic setting.  If you are getting Prolia, a special MedGuide will be given to you by the pharmacist with each prescription and refill. Be sure to read this information carefully each time.  For Prolia, talk to your pediatrician regarding the use of this medicine in children. Special care may be needed. For Xgeva, talk to your pediatrician regarding the use of this medicine in children. While this drug may be prescribed for children as young as 13 years for selected conditions, precautions do apply.  Overdosage: If you think you have taken too much of this medicine contact a poison control center or emergency room at once.  NOTE: This medicine is only for you. Do not share this medicine with others.  What if I miss a dose?  It is important not to miss your dose. Call your doctor or health care professional if you are  unable to keep an appointment.  What may interact with this medicine?  Do not take this medicine with any of the following medications:  -other medicines containing denosumab  This medicine may also interact with the following medications:  -medicines that suppress the immune system  -medicines that treat cancer  -steroid medicines like prednisone or cortisone  This list may not describe all possible interactions. Give your health care provider a list of all the medicines, herbs, non-prescription drugs, or dietary supplements you use. Also tell them if you smoke, drink alcohol, or use illegal drugs. Some items may interact with your medicine.  What should I watch for while using this medicine?  Visit your doctor or health care professional for regular checks on your progress. Your doctor or health care professional may order blood tests and other tests to see how you are doing.  Call your doctor or health care professional if you get a cold or other infection while receiving this medicine. Do not treat yourself. This medicine may decrease your body's ability to fight infection.  You should make sure you get enough calcium and vitamin D while you are taking this medicine, unless your doctor tells you not to. Discuss the foods you eat and the vitamins you take with your health care professional.  See your dentist regularly. Brush and floss your teeth as directed. Before you have any dental work done, tell your dentist you are receiving this medicine.  Do   not become pregnant while taking this medicine or for 5 months after stopping it. Women should inform their doctor if they wish to become pregnant or think they might be pregnant. There is a potential for serious side effects to an unborn child. Talk to your health care professional or pharmacist for more information.  What side effects may I notice from receiving this medicine?  Side effects that you should report to your doctor or health care professional as soon as  possible:  -allergic reactions like skin rash, itching or hives, swelling of the face, lips, or tongue  -breathing problems  -chest pain  -fast, irregular heartbeat  -feeling faint or lightheaded, falls  -fever, chills, or any other sign of infection  -muscle spasms, tightening, or twitches  -numbness or tingling  -skin blisters or bumps, or is dry, peels, or red  -slow healing or unexplained pain in the mouth or jaw  -unusual bleeding or bruising  Side effects that usually do not require medical attention (Report these to your doctor or health care professional if they continue or are bothersome.):  -muscle pain  -stomach upset, gas  This list may not describe all possible side effects. Call your doctor for medical advice about side effects. You may report side effects to FDA at 1-800-FDA-1088.  Where should I keep my medicine?  This medicine is only given in a clinic, doctor's office, or other health care setting and will not be stored at home.  NOTE: This sheet is a summary. It may not cover all possible information. If you have questions about this medicine, talk to your doctor, pharmacist, or health care provider.      2016, [employer]vier/Gold Standard. (2011-09-10 12:37:47)

## 2016-01-11 NOTE — Progress Notes (Signed)
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  The patient had left breast and left axillary lymph node biopsy performed March of 2010, both positive (I do not have the pathology report at this point). She was treated neo-adjuvantly at [facility] with (a) paclitaxel weekly x12 and (b) doxorubicin/cyclophosphamide in dose dense fashion x4, both given with tifiparnib.   She then proceeded to left modified radical mastectomy 12/23/2008, the final pathology showing a 4 cm residual invasive lobular carcinoma, involving 5/22 lymph nodes sampled. The tumor was estrogen and progesterone receptor positive, HER-2 negative.   Postoperatively she received 50 gray of radiation completed January of 2011, including the left supraclavicular lymph node basin. She was then started on tamoxifen, but developed some uterine lining thickening and was switched to anastrozole in May 2012. Her subsequent history is as detailed below  INTERVAL HISTORY: Michaela Little returns today for follow-up of her estrogen receptor positive breast cancer. Interval history is generally stable. She continues on letrozole, with good tolerance. Hot flashes and vaginal dryness are not a major issue. She never developed the arthralgias or myalgias that many patients can experience on this medication. She obtains it at a good price.  She is also on palbociclib. Currently this is her "off" week. She does not notice any  difference in his symptoms whether she is on it or off it. She is currently obtaining it at a very good price  She is trying to move to a nicer area because the place where she lives now is "not good for my health".  She tolerates the dose mouth/Xgeva shots without any complications  REVIEW OF SYSTEMS: Idaliz has some nausea, which is fairly constant. She has at most days. She is using Ginger tea to take care of it. She has occasional headaches. She thinks the headaches happen about every 3 days or so. There are bothersome but not accompanied by visual changes, auras, or other symptoms. She is trying to remain as stable as she can, but she has pain in her hands knees neck back and generally doesn't feel very well. She has some sinus problems, a little bit of a sore throat, and sometimes a dry cough. Her skin feels itchy. A detailed review of systems today was otherwise stable.  PAST MEDICAL HISTORY: Past Medical History:  Diagnosis Date  . Anxiety   . Arthritis    hands, knees, hips  . Asthma   . Cancer ([place])   . Cervical stenosis (uterine cervix)   . Dyspnea on exertion   . GERD (gastroesophageal reflux disease)   . Headache(784.0)   . History of breast cancer    2010--  LEFT  s/p  mastectomy (in [place]) AND CHEMORADIATION--  NO RECURRENCE  . History of cervical dysplasia   . History of TB skin testing    AS TEEN--  [place] W/ MEDS  . Hyperlipidemia   . Hypertension   . OSA (obstructive sleep apnea) moderate osa per study  09/2010   CPAP  , NOT  USING ON REGULAR BASIS  . Pelvic pain in female   . Positive H. pylori test    08-05-2013  . Refusal of blood transfusions as patient is Jehovah's Witness   . Seasonal allergies   . Uterine fibroid   . Wears glasses     PAST SURGICAL HISTORY: Past Surgical History:  Procedure Laterality Date  . CARDIOVASCULAR STRESS TEST  10-29-2012   low risk perfusion study/  no significant reversibity/ ef 66%/  normal wall motion  . CERVICAL CONIZATION  W/BX  2012   in  [place] N/A 05/14/2012   Procedure: LAPAROSCOPIC CHOLECYSTECTOMY;  Surgeon: Ralene Ok, MD;  Location: [place] [facility];  Service: General;  Laterality: N/A;  . COLONOSCOPY  2011   normal per patient - [place]  . DILATION AND CURETTAGE OF UTERUS  10/15/2011   Procedure: DILATATION AND CURETTAGE;  Surgeon: Melina Schools, MD;  Location: [facility];  Service: Gynecology;  Laterality: N/A;  Conization &  endocervical curettings  . EXAMINATION UNDER ANESTHESIA N/A 08/20/2013   Procedure: EXAM UNDER ANESTHESIA;  Surgeon: Margarette Asal, MD;  Location: [facility];  Service: Gynecology;  Laterality: N/A;  . [place]   right  . LAPAROSCOPIC ASSISTED VAGINAL HYSTERECTOMY N/A 10/26/2014   Procedure: HYSTERECTOMY ABDOMINAL ;  Surgeon: Molli Posey, MD;  Location: [facility];  Service: Gynecology;  Laterality: N/A;  . MASTECTOMY Left 11/2008  in [place]   . SALPINGOOPHORECTOMY Bilateral 10/26/2014   Procedure: BILATERAL SALPINGO OOPHORECTOMY;  Surgeon: Molli Posey, MD;  Location: [facility];  Service: Gynecology;  Laterality: Bilateral;  . TRANSTHORACIC ECHOCARDIOGRAM  10-29-2012   mild lvh/  ef 60-65%/  grade II diastolic dysfunction/  trivial mr  &  tr    FAMILY HISTORY Family History  Problem Relation Age of Onset  . Breast cancer Mother   . Colon cancer Mother   . Hypotension Mother   . Asthma Mother   . Diabetes type II Mother   . Arthritis Mother   . Clotting disorder Mother   . Cancer Mother     breast/colon  . Mental illness Brother   . Heart disease Brother   . Cerebral palsy Daughter   . Emphysema Brother     never smoker  . Colon cancer Maternal Aunt   . Cancer Maternal Aunt     colon  . Colon cancer Maternal Uncle   . Cancer Maternal Uncle     colon  . Esophageal cancer Neg Hx   . Rectal cancer Neg Hx   . Stomach cancer Neg Hx   . Thyroid disease Neg Hx    Gynecologic history: She is GX P5. At first pregnancy to  term age 65. Menarche age 6. Last menstrual period when she had her chemotherapy.   Social History: She is disabled secondary to arthritis. She moved from [place] to [place] for "peace of mind". Of her 5 children only one daughter is living in [place], [place]. One daughter in [place] is incarcerated and another one is "in a lot of stress". The patient lives alone. She mostly reads and does computer games on her phone during the day. She has 20 grandchildren.    ADVANCED DIRECTIVES: She does not have a healthcare power of attorney.    HEALTH MAINTENANCE: Social History  Substance Use Topics  . Smoking status: Former Smoker    Packs/day: 0.30    Years: 10.00  Types: Cigarettes    Quit date: 03/26/1988  . Smokeless tobacco: Never Used  . Alcohol use No     Allergies  Allergen Reactions  . Lisinopril-Hydrochlorothiazide Itching  . Adhesive [Tape] Itching and Other (See Comments)    REDDNESS REDDNESS  . Calcium Hives    Fruit flavored Tums  Fruit flavored Tums   . Calcium Carbonate Antacid Hives    Fruit flavored Tums   . Hctz [Hydrochlorothiazide] Itching  . Latex Itching  . Lisinopril Itching  . Losartan Potassium Other (See Comments)    Makes her feel "bad" Makes her feel "bad"    Current Outpatient Prescriptions  Medication Sig Dispense Refill  . cephALEXin (KEFLEX) 500 MG capsule Take 1 capsule (500 mg total) by mouth 2 (two) times daily. 14 capsule 0  . dicyclomine (BENTYL) 20 MG tablet Take 1 tablet (20 mg total) by mouth 2 (two) times daily. Take, as needed, for abdominal pain and cramping 20 tablet 0  . diphenhydrAMINE (BENADRYL) 25 mg capsule Take 25 mg by mouth every 6 (six) hours as needed for allergies.    Marland Kitchen docusate sodium (COLACE) 100 MG capsule Take 1 capsule (100 mg total) by mouth every 12 (twelve) hours. (Patient not taking: Reported on 12/02/2015) 60 capsule 0  . IBRANCE 100 MG capsule TAKE 1 CAPSULE BY MOUTH DAILY FOR 21 DAYS THEN 7 DAYS REST.  TAKE WHOLE WITH FOOD. 21 capsule 0  . letrozole (FEMARA) 2.5 MG tablet Take 1 tablet (2.5 mg total) by mouth daily. 90 tablet 4  . loratadine (CLARITIN) 10 MG tablet Take 1 tablet (10 mg total) by mouth daily. 90 tablet 1  . metoprolol tartrate (LOPRESSOR) 25 MG tablet Take 25 mg by mouth 2 (two) times daily.    . NONFORMULARY OR COMPOUNDED ITEM APPLY 1-2 GRAMS FOUR TIMES DAILY TO AFFECTED AREA(S)  -GABAPENTIN(T) 6% DICLOFENAC(T) 5% LIDOCAINE 5% DMSO 5% CRM  4  . phenazopyridine (PYRIDIUM) 200 MG tablet Take 1 tablet (200 mg total) by mouth 3 (three) times daily. 5 tablet 0  . promethazine (PHENERGAN) 25 MG tablet Take 1 tablet (25 mg total) by mouth every 6 (six) hours as needed for nausea or vomiting. 12 tablet 0  . Pseudoeph-Doxylamine-DM-APAP (NYQUIL PO) Take 30 mLs by mouth once as needed (cough/cold symptoms.).     No current facility-administered medications for this visit.     OBJECTIVE: Middle-aged [place] American woman in no acute distress Vitals:   01/11/16 1153  BP: (!) 164/78  Pulse: 67  Resp: 18  Temp: 98.1 F (36.7 C)     Body mass index is 42.24 kg/m.    ECOG FS: 1 Filed Weights   01/11/16 1153  Weight: 269 lb 11.2 oz (122.3 kg)   Sclerae unicteric, pupils round and equal Oropharynx clear and moist-- no thrush or other lesions No cervical or supraclavicular adenopathy Lungs no rales or rhonchi Heart regular rate and rhythm Abd soft, obese, nontender, positive bowel sounds MSK no focal spinal tenderness, no upper extremity lymphedema Neuro: nonfocal, well oriented, appropriate affect Breasts: The right breast is benign. The left breast is status post mastectomy. There is no evidence of chest wall recurrence. The left axilla is benign.   LAB RESULTS: Lab Results  Component Value Date   WBC 2.4 (L) 01/11/2016   NEUTROABS 1.2 (L) 01/11/2016   HGB 11.7 01/11/2016   HCT 35.1 01/11/2016   MCV 86.5 01/11/2016   PLT 190 01/11/2016      Chemistry  Component Value Date/Time   NA 141 01/11/2016 1042   K 4.0 01/11/2016 1042   CL 104 12/02/2015 1946   CL 105 07/07/2012 1327   CO2 24 01/11/2016 1042   BUN 7.9 01/11/2016 1042   CREATININE 0.7 01/11/2016 1042      Component Value Date/Time   CALCIUM 8.6 01/11/2016 1042   ALKPHOS 122 01/11/2016 1042   AST 15 01/11/2016 1042   ALT 7 01/11/2016 1042   BILITOT 0.94 01/11/2016 1042       Lab Results  Component Value Date   LABCA2 19 01/17/2012     STUDIES: No results found.   IMPRESSION: 55 y.o.  [place] woman relocated here from [place],    (1) breast cancer of left breast overlapping sites diagnosed March of 2010 and treated [facility]-adjuvantly at [facility] with (a) paclitaxel weekly x12 and (b) doxorubicin/ cyclophosphamide in dose dense fashion x4, both given with tifiparnib.   (2) definitive left modified radical mastectomy September of 2010 for a T2 N2 or stage IIIA invasive lobular breast cancer, estrogen and progesterone receptor positive, HER-2 negative,   (3)  post mastectomy radiation completed January of 2011,  (4) briefly on tamoxifen, discontinued it because of uterine lining concerns.  On anastrozole since May 2012 with good tolerance. Held between November and December 2013 due to GI issues, unrelated to cancer or its treatment.  Discontinued by the patient summer of 2016  (5)  Other problems include left upper extremity lymphedema, borderline concern regarding genetic family history, history of cervical dysplasia, and history of dense breasts.  METASTATIC DISEASE: (6) iliac bone biopsy (laterality?) 05/26/2015 showed metastatic carcinoma consistent with a breast primary, estrogen receptor positive, progesterone receptor and HER-2 negative  (a) extensive staging studies including CT scans of the chest abdomen and pelvis, MRI of the brain, and a PET scan February-March 2017 in [place] showed no evidence of visceral disease   (7) palliative radiation to the  right 8th rib lesion (single 8 gray dose) and right hip (20 gray in 5 fractions) given 06/24/2015 through 06/27/2015 at [facility]  (8) letrozole and palbociclib started March 2017  (a) palbociclib dose decreased to 100 mg daily (21/7) because of cytopenias  (b) repeat chest CT and bone scan 11/07/2015 shows stable bone only metastatic disease  (9) enoxaparin started February 2017 in [facility]  for reading of "central pulmonary embolus" on CT scan of the chest 04/19/2015  (a) CT report amended 10/18/2015 show no pulmonary embolus; enoxaparin discontinued  (10) denosumab/Xgeva started 11/16/2015, repeated monthly  PLAN: Michaela Little is very stable and is tolerating her current medications well. I am not making any changes in her bulb palbociclib dose and she will continue on letrozole.  She is also continuing on denosumab/Xgeva monthly, with a dose today.  We are going to restage her with a PET scan in December. She will see me shortly after that. She knows to call for any problems that may develop before her next visit here.            Michaela Little C 01/12/2016

## 2016-01-12 ENCOUNTER — Other Ambulatory Visit: Payer: Self-pay

## 2016-01-12 MED ORDER — METOPROLOL TARTRATE 25 MG PO TABS: 25.0000 mg | ORAL_TABLET | Freq: Two times a day (BID) | ORAL | 1 refills | Status: DC

## 2016-01-13 LAB — URINE CULTURE

## 2016-01-17 ENCOUNTER — Other Ambulatory Visit: Payer: Self-pay

## 2016-01-17 MED ORDER — PROMETHAZINE HCL 25 MG PO TABS: 25.0000 mg | ORAL_TABLET | Freq: Four times a day (QID) | ORAL | 0 refills | Status: DC | PRN

## 2016-01-30 DIAGNOSIS — C50919 Malignant neoplasm of unspecified site of unspecified female breast: Secondary | ICD-10-CM | POA: Diagnosis not present

## 2016-02-02 ENCOUNTER — Encounter (HOSPITAL_COMMUNITY): Payer: Self-pay | Admitting: *Deleted

## 2016-02-02 ENCOUNTER — Emergency Department (HOSPITAL_COMMUNITY): Payer: Medicare Other

## 2016-02-02 ENCOUNTER — Emergency Department (HOSPITAL_BASED_OUTPATIENT_CLINIC_OR_DEPARTMENT_OTHER): Admit: 2016-02-02 | Discharge: 2016-02-02 | Disposition: A | Discharge status: Home / Self Care | Payer: Medicare Other

## 2016-02-02 ENCOUNTER — Emergency Department (HOSPITAL_COMMUNITY)
Admission: EM | Admit: 2016-02-02 | Discharge: 2016-02-02 | Disposition: A | Discharge status: Home / Self Care | Payer: Medicare Other | Attending: Emergency Medicine | Admitting: Emergency Medicine

## 2016-02-02 ENCOUNTER — Other Ambulatory Visit: Payer: Self-pay | Admitting: Oncology

## 2016-02-02 DIAGNOSIS — N390 Urinary tract infection, site not specified: Secondary | ICD-10-CM | POA: Diagnosis not present

## 2016-02-02 DIAGNOSIS — R0602 Shortness of breath: Secondary | ICD-10-CM | POA: Diagnosis not present

## 2016-02-02 DIAGNOSIS — Z79899 Other long term (current) drug therapy: Secondary | ICD-10-CM | POA: Insufficient documentation

## 2016-02-02 DIAGNOSIS — I10 Essential (primary) hypertension: Secondary | ICD-10-CM | POA: Insufficient documentation

## 2016-02-02 DIAGNOSIS — R079 Chest pain, unspecified: Secondary | ICD-10-CM | POA: Diagnosis present

## 2016-02-02 DIAGNOSIS — Z91040 Latex allergy status: Secondary | ICD-10-CM | POA: Diagnosis not present

## 2016-02-02 DIAGNOSIS — C50912 Malignant neoplasm of unspecified site of left female breast: Secondary | ICD-10-CM

## 2016-02-02 DIAGNOSIS — Z87891 Personal history of nicotine dependence: Secondary | ICD-10-CM | POA: Insufficient documentation

## 2016-02-02 DIAGNOSIS — M542 Cervicalgia: Secondary | ICD-10-CM | POA: Insufficient documentation

## 2016-02-02 DIAGNOSIS — R930 Abnormal findings on diagnostic imaging of skull and head, not elsewhere classified: Secondary | ICD-10-CM | POA: Insufficient documentation

## 2016-02-02 DIAGNOSIS — C7981 Secondary malignant neoplasm of breast: Secondary | ICD-10-CM | POA: Diagnosis not present

## 2016-02-02 DIAGNOSIS — J45909 Unspecified asthma, uncomplicated: Secondary | ICD-10-CM | POA: Diagnosis not present

## 2016-02-02 DIAGNOSIS — C50919 Malignant neoplasm of unspecified site of unspecified female breast: Secondary | ICD-10-CM

## 2016-02-02 DIAGNOSIS — C773 Secondary and unspecified malignant neoplasm of axilla and upper limb lymph nodes: Secondary | ICD-10-CM | POA: Diagnosis not present

## 2016-02-02 DIAGNOSIS — R200 Anesthesia of skin: Secondary | ICD-10-CM | POA: Diagnosis not present

## 2016-02-02 DIAGNOSIS — M79601 Pain in right arm: Secondary | ICD-10-CM | POA: Diagnosis not present

## 2016-02-02 LAB — URINE MICROSCOPIC-ADD ON
BACTERIA UA: NONE SEEN
RBC / HPF: NONE SEEN RBC/hpf (ref 0–5)

## 2016-02-02 LAB — BASIC METABOLIC PANEL
Anion gap: 10 (ref 5–15)
BUN: 10 mg/dL (ref 6–20)
CO2: 23 mmol/L (ref 22–32)
CREATININE: 0.78 mg/dL (ref 0.44–1.00)
Calcium: 9.5 mg/dL (ref 8.9–10.3)
Chloride: 105 mmol/L (ref 101–111)
GFR calc Af Amer: >60 mL/min (ref >60)
Glucose, Bld: 108 mg/dL — ABNORMAL HIGH (ref 65–99)
POTASSIUM: 4.3 mmol/L (ref 3.5–5.1)
SODIUM: 138 mmol/L (ref 135–145)

## 2016-02-02 LAB — URINALYSIS, ROUTINE W REFLEX MICROSCOPIC
Bilirubin Urine: NEGATIVE
Glucose, UA: NEGATIVE mg/dL
Hgb urine dipstick: NEGATIVE
KETONES UR: NEGATIVE mg/dL
NITRITE: NEGATIVE
PROTEIN: NEGATIVE mg/dL
Specific Gravity, Urine: 1.028 (ref 1.005–1.030)
pH: 5.5 (ref 5.0–8.0)

## 2016-02-02 LAB — CBC WITH DIFFERENTIAL/PLATELET
BASOS PCT: 1 %
Basophils Absolute: 0 10*3/uL (ref 0.0–0.1)
EOS ABS: 0.2 10*3/uL (ref 0.0–0.7)
EOS PCT: 6 %
HCT: 36 % (ref 36.0–46.0)
Hemoglobin: 11.9 g/dL — ABNORMAL LOW (ref 12.0–15.0)
LYMPHS ABS: 1.1 10*3/uL (ref 0.7–4.0)
Lymphocytes Relative: 38 %
MCH: 28.7 pg (ref 26.0–34.0)
MCHC: 33.1 g/dL (ref 30.0–36.0)
MCV: 86.7 fL (ref 78.0–100.0)
Monocytes Absolute: 0.2 10*3/uL (ref 0.1–1.0)
Monocytes Relative: 5 %
Neutro Abs: 1.5 10*3/uL — ABNORMAL LOW (ref 1.7–7.7)
Neutrophils Relative %: 50 %
PLATELETS: 200 10*3/uL (ref 150–400)
RBC: 4.15 MIL/uL (ref 3.87–5.11)
RDW: 15.2 % (ref 11.5–15.5)
WBC: 3 10*3/uL — AB (ref 4.0–10.5)

## 2016-02-02 LAB — TROPONIN I

## 2016-02-02 MED ORDER — HYDROCODONE-ACETAMINOPHEN 5-325 MG PO TABS: 1.0000 | ORAL_TABLET | ORAL | 0 refills | Status: DC | PRN

## 2016-02-02 MED ORDER — NAPROXEN 250 MG PO TABS: 375.0000 mg | ORAL_TABLET | Freq: Once | ORAL | Status: DC

## 2016-02-02 MED ORDER — CEPHALEXIN 500 MG PO CAPS: 500.0000 mg | ORAL_CAPSULE | Freq: Three times a day (TID) | ORAL | 0 refills | Status: AC

## 2016-02-02 MED ORDER — OXYCODONE-ACETAMINOPHEN 5-325 MG PO TABS
2.0000 | ORAL_TABLET | Freq: Once | ORAL | Status: AC
  Administered 2016-02-02: 2 via ORAL
  Filled 2016-02-02: qty 2

## 2016-02-02 MED ORDER — GABAPENTIN 100 MG PO CAPS: 100.0000 mg | ORAL_CAPSULE | Freq: Three times a day (TID) | ORAL | 0 refills | Status: DC

## 2016-02-02 MED ORDER — NAPROXEN 375 MG PO TABS: 375.0000 mg | ORAL_TABLET | Freq: Two times a day (BID) | ORAL | 0 refills | Status: AC | PRN

## 2016-02-02 NOTE — ED Triage Notes (Addendum)
Pt states R shoulder pain and swelling that radiates down to fingers x 3 days.  Hx of L breast ca that has returned with mets to R ribs/R hip.  Pt also states frequent urination with foul smelling urine.

## 2016-02-02 NOTE — ED Notes (Signed)
Pt requested to go to the RR prior to taking ECG.

## 2016-02-02 NOTE — ED Provider Notes (Signed)
[place] DEPT Provider Note   CSN: [id number] Arrival date & time: 02/02/16  1136     History   Chief Complaint Chief Complaint  Patient presents with  . Arm Pain    and swelling  . Dysuria    HPI Michaela Little is a 55 y.o. female.  HPI 55 year old female with past medical history of metastatic breast cancer here with right neck, arm, and chest pain. The patient states that over the last 4 days, she has had progressively worsening right shoulder pain that radiates down her right arm. She shows a pain as an aching, throbbing like pain that intermittently worsens which sharp, shooting pains down to her fingers. Pain is made worse with movement. She denies any preceding trauma. Denies any numbness or weakness in between episodes of pain. She also describes feeling tender "bumps" in her right armpit as well as her neck and clavicle area. She has had no cough. She also endorses moderate neck pain over the last several nights, that is worse with walking, bending, and at night. No fevers or chills. No other medical complaints.   Past Medical History:  Diagnosis Date  . Anxiety   . Arthritis    hands, knees, hips  . Asthma   . Cancer ([place])   . Cervical stenosis (uterine cervix)   . Dyspnea on exertion   . GERD (gastroesophageal reflux disease)   . Headache(784.0)   . History of breast cancer    2010--  LEFT  s/p  mastectomy (in [place]) AND CHEMORADIATION--  NO RECURRENCE  . History of cervical dysplasia   . History of TB skin testing    AS TEEN--  [place]/ MEDS  . Hyperlipidemia   . Hypertension   . OSA (obstructive sleep apnea) moderate osa per study  09/2010   CPAP  , NOT USING ON REGULAR BASIS  . Pelvic pain in female   . Positive H. pylori test    08-05-2013  . Refusal of blood transfusions as patient is Jehovah's Witness   . Seasonal allergies   . Uterine fibroid   . Wears glasses     Patient Active Problem List   Diagnosis Date Noted  . Bone metastases ([place])  11/13/2015  . FH: TAH-BSO (total abdominal hysterectomy and bilateral salpingo-oophorectomy) 10/27/2014  . Right thyroid nodule 06/30/2014  . Allergic rhinitis due to pollen 06/30/2014  . Essential hypertension 12/24/2013  . Left ventricular diastolic dysfunction with preserved systolic function [id number]  . Neck nodule 08/11/2013  . Abdominal pain, chronic, bilateral lower quadrant 08/05/2013  . Abdominal pain, epigastric 08/05/2013  . Severe obesity (BMI >= 40) ([place]) 06/23/2013  . Allergic rhinitis 06/23/2013  . GERD (gastroesophageal reflux disease) 03/25/2013  . Constipation 03/25/2013  . Other and unspecified hyperlipidemia 03/25/2013  . Dysuria 03/25/2013  . Routine general medical examination at a health care facility 03/25/2013  . Cancer of overlapping sites of left female breast ([place]) 03/05/2013  . Cough 10/28/2012  . Dyspnea 10/28/2012  . Chronic abdominal pain 09/02/2012  . Pelvic pain in female 02/06/2012  . Atypical chest pain 11/13/2011  . Hyperlipidemia 11/13/2011  . Dysplasia of cervix, low grade (CIN 1) 08/22/2011  . Diastolic dysfunction [id number]  . Influenza 03/04/2011  . Hypertension 03/04/2011  . Obesity 03/04/2011  . OSA (obstructive sleep apnea) 02/27/2011  . Asthma 02/08/2011    Past Surgical History:  Procedure Laterality Date  . CARDIOVASCULAR STRESS TEST  10-29-2012   low risk perfusion study/  no significant reversibity/  ef 66%/  normal wall motion  . CERVICAL CONIZATION W/BX  2012   in  [place] N/A 05/14/2012   Procedure: LAPAROSCOPIC CHOLECYSTECTOMY;  Surgeon: Ralene Ok, MD;  Location: [facility];  Service: General;  Laterality: N/A;  . COLONOSCOPY  2011   normal per patient - [place]  . DILATION AND CURETTAGE OF UTERUS  10/15/2011   Procedure: DILATATION AND CURETTAGE;  Surgeon: Melina Schools, MD;  Location: [facility];  Service: Gynecology;  Laterality: N/A;  Conization &  endocervical curettings  . EXAMINATION UNDER ANESTHESIA N/A  08/20/2013   Procedure: EXAM UNDER ANESTHESIA;  Surgeon: Margarette Asal, MD;  Location: [facility];  Service: Gynecology;  Laterality: N/A;  . [place]   right  . LAPAROSCOPIC ASSISTED VAGINAL HYSTERECTOMY N/A 10/26/2014   Procedure: HYSTERECTOMY ABDOMINAL ;  Surgeon: Molli Posey, MD;  Location: [facility];  Service: Gynecology;  Laterality: N/A;  . MASTECTOMY Left 11/2008  in [place]   . SALPINGOOPHORECTOMY Bilateral 10/26/2014   Procedure: BILATERAL SALPINGO OOPHORECTOMY;  Surgeon: Molli Posey, MD;  Location: [facility];  Service: Gynecology;  Laterality: Bilateral;  . TRANSTHORACIC ECHOCARDIOGRAM  10-29-2012   mild lvh/  ef 60-65%/  grade II diastolic dysfunction/  trivial mr  &  tr    OB History    Gravida Para Term Preterm AB Living   10 5 5   5 5    SAB TAB Ectopic Multiple Live Births   3 2             Home Medications    Prior to Admission medications   Medication Sig Start Date End Date Taking? Authorizing Provider  cephALEXin (KEFLEX) 500 MG capsule Take 1 capsule (500 mg total) by mouth 3 (three) times daily. 02/02/16 02/09/16  Duffy Bruce, MD  dicyclomine (BENTYL) 20 MG tablet Take 1 tablet (20 mg total) by mouth 2 (two) times daily. Take, as needed, for abdominal pain and cramping 12/03/15   Antonietta Breach, PA-C  diphenhydrAMINE (BENADRYL) 25 mg capsule Take 25 mg by mouth every 6 (six) hours as needed for allergies.    Historical Provider, MD  docusate sodium (COLACE) 100 MG capsule Take 1 capsule (100 mg total) by mouth every 12 (twelve) hours. Patient not taking: Reported on 12/02/2015 11/03/14   West Pugh, NP  gabapentin (NEURONTIN) 100 MG capsule Take 1 capsule (100 mg total) by mouth 3 (three) times daily. 02/02/16 02/12/16  Duffy Bruce, MD  HYDROcodone-acetaminophen (NORCO/VICODIN) 5-325 MG tablet Take 1-2 tablets by mouth every 4 (four) hours as needed for severe pain. 02/02/16   Duffy Bruce, MD  IBRANCE 100 MG capsule  TAKE 1 CAPSULE BY MOUTH DAILY FOR 21 DAYS THEN 7 DAYS REST. TAKE WHOLE WITH FOOD. 01/06/16   Chauncey Cruel, MD  letrozole [facility]) 2.5 MG tablet Take 1 tablet (2.5 mg total) by mouth daily. 10/17/15   Chauncey Cruel, MD  loratadine (CLARITIN) 10 MG tablet Take 1 tablet (10 mg total) by mouth daily. 11/21/15   Lauree Chandler, NP  metoprolol tartrate (LOPRESSOR) 25 MG tablet Take 1 tablet (25 mg total) by mouth 2 (two) times daily. 01/12/16   Gildardo Cranker, DO  naproxen (NAPROSYN) 375 MG tablet Take 1 tablet (375 mg total) by mouth 2 (two) times daily as needed for moderate pain. 02/02/16 02/09/16  Duffy Bruce, MD  NONFORMULARY OR COMPOUNDED ITEM APPLY 1-2 GRAMS FOUR TIMES DAILY TO AFFECTED AREA(S)  -  GABAPENTIN(T) 6% DICLOFENAC(T) 5% LIDOCAINE 5% DMSO 5% CRM 09/30/15   Historical Provider, MD  phenazopyridine (PYRIDIUM) 200 MG tablet Take 1 tablet (200 mg total) by mouth 3 (three) times daily. 12/03/15   Antonietta Breach, PA-C  promethazine (PHENERGAN) 25 MG tablet Take 1 tablet (25 mg total) by mouth every 6 (six) hours as needed for nausea or vomiting. 01/17/16   Gildardo Cranker, DO  Pseudoeph-Doxylamine-DM-APAP (NYQUIL PO) Take 30 mLs by mouth once as needed (cough/cold symptoms.).    Historical Provider, MD    Family History Family History  Problem Relation Age of Onset  . Breast cancer Mother   . Colon cancer Mother   . Hypotension Mother   . Asthma Mother   . Diabetes type II Mother   . Arthritis Mother   . Clotting disorder Mother   . Cancer Mother     breast/colon  . Mental illness Brother   . Heart disease Brother   . Cerebral palsy Daughter   . Emphysema Brother     never smoker  . Colon cancer Maternal Aunt   . Cancer Maternal Aunt     colon  . Colon cancer Maternal Uncle   . Cancer Maternal Uncle     colon  . Esophageal cancer Neg Hx   . Rectal cancer Neg Hx   . Stomach cancer Neg Hx   . Thyroid disease Neg Hx     Social History Social History  Substance Use Topics    . Smoking status: Former Smoker    Packs/day: 0.30    Years: 10.00    Types: Cigarettes    Quit date: 03/26/1988  . Smokeless tobacco: Never Used  . Alcohol use No     Allergies   Lisinopril-hydrochlorothiazide; Adhesive [tape]; Calcium; Calcium carbonate antacid; Hctz [hydrochlorothiazide]; Latex; Lisinopril; and Losartan potassium   Review of Systems Review of Systems  Constitutional: Positive for fatigue. Negative for chills and fever.  HENT: Negative for congestion, rhinorrhea and sore throat.   Eyes: Negative for visual disturbance.  Respiratory: Negative for cough, shortness of breath and wheezing.   Cardiovascular: Negative for chest pain and leg swelling.  Gastrointestinal: Negative for abdominal pain, diarrhea, nausea and vomiting.  Genitourinary: Negative for dysuria, flank pain, vaginal bleeding and vaginal discharge.  Musculoskeletal: Positive for back pain, neck pain and neck stiffness.  Skin: Negative for rash.  Allergic/Immunologic: Negative for immunocompromised state.  Neurological: Negative for syncope and headaches.  Hematological: Does not bruise/bleed easily.  All other systems reviewed and are negative.    Physical Exam Updated Vital Signs BP 184/75 (BP Location: Right Leg)   Pulse (!) 55   Temp 98.2 F (36.8 C) (Oral)   Resp 18   Ht 5\' 7"  (1.702 m)   Wt 255 lb (115.7 kg)   LMP 02/25/2009   SpO2 100%   BMI 39.94 kg/m   Physical Exam  Constitutional: She is oriented to person, place, and time. She appears well-developed and well-nourished. No distress.  HENT:  Head: Normocephalic and atraumatic.  Eyes: Conjunctivae are normal.  Neck: Neck supple.  Moderate midline and right paraspinal tenderness to palpation with positive Spurling's test. No bruising or deformity.  Cardiovascular: Normal rate, regular rhythm and normal heart sounds.  Exam reveals no friction rub.   No murmur heard. Pulmonary/Chest: Effort normal and breath sounds normal. No  respiratory distress. She has no wheezes. She has no rales.  Abdominal: She exhibits no distension.  Musculoskeletal: She exhibits no edema.  Neurological: She is  alert and oriented to person, place, and time. She exhibits normal muscle tone.  Strength 5 out of 5 in bilateral upper extremities proximally and distally. Sensation intact to light touch and pinprick.  Skin: Skin is warm. Capillary refill takes less than 2 seconds. No rash noted.  Psychiatric: She has a normal mood and affect.  Nursing note and vitals reviewed.    ED Treatments / Results  Labs (all labs ordered are listed, but only abnormal results are displayed) Labs Reviewed  URINALYSIS, ROUTINE W REFLEX MICROSCOPIC (NOT AT [facility]) - Abnormal; Notable for the following:       Result Value   Color, Urine AMBER (*)    APPearance CLOUDY (*)    Leukocytes, UA MODERATE (*)    All other components within normal limits  URINE MICROSCOPIC-ADD ON - Abnormal; Notable for the following:    Squamous Epithelial / LPF 6-30 (*)    All other components within normal limits  CBC WITH DIFFERENTIAL/PLATELET - Abnormal; Notable for the following:    WBC 3.0 (*)    Hemoglobin 11.9 (*)    Neutro Abs 1.5 (*)    All other components within normal limits  BASIC METABOLIC PANEL - Abnormal; Notable for the following:    Glucose, Bld 108 (*)    All other components within normal limits  URINE CULTURE  TROPONIN I    EKG  EKG Interpretation None       Radiology Dg Chest 2 View  Result Date: 02/02/2016 CLINICAL DATA:  Bilateral shoulder pain for 4 days, right shoulder pain radiates into right arm, right hand swollen and numb, has been stumbling when walking as well, SOB at times - hx of left side breast cancer, asthma, , htn, OSA, GERD EXAM: CHEST  2 VIEW COMPARISON:  04/17/2015 FINDINGS: Cardiac silhouette is top-normal in size. No mediastinal or hilar masses. No evidence of adenopathy. Clear lungs.  No pleural effusion or pneumothorax.  Stable changes from left breast surgery. Skeletal structures are intact. IMPRESSION: No active cardiopulmonary disease. Electronically Signed   By: Lajean Manes M.D.   On: 02/02/2016 15:40   Ct Head Wo Contrast  Result Date: 02/02/2016 CLINICAL DATA:  Initial evaluation for right arm numbness. No known injury. EXAM: CT HEAD WITHOUT CONTRAST CT CERVICAL SPINE WITHOUT CONTRAST TECHNIQUE: Multidetector CT imaging of the head and cervical spine was performed following the standard protocol without intravenous contrast. Multiplanar CT image reconstructions of the cervical spine were also generated. COMPARISON:  Prior CT from 03/12/2013. FINDINGS: CT HEAD FINDINGS Brain: Cerebral volume normal. No acute intracranial hemorrhage. No evidence for acute large vessel territory infarct. No mass lesion, midline shift or mass effect. No hydrocephalus. No extra-axial fluid collection. Vascular: No hyperdense vessel. Skull: Scalp soft tissues within normal limits.  Calvarium intact. Sinuses/Orbits: The globes and orbital soft tissues within normal limits. Mild scattered mucosal thickening within the sphenoid sinuses, ethmoidal air cells, and maxillary sinuses. No air-fluid level to suggest active sinus infection. No mastoid effusion. CT CERVICAL SPINE FINDINGS Alignment: Vertebral bodies are normally aligned.  No listhesis. Skull base and vertebrae: Skullbase intact. No skin abrasions are preserved. Dens is intact. Visualized osseous structures are diffusely mottled appearance. Vertebral body heights maintained. No fracture. Soft tissues and spinal canal: Visualized soft tissues demonstrate no acute abnormality. No prevertebral edema. Note made of a subcentimeter hypodense nodule within the right lobe of thyroid. Disc levels: Mild to moderate degenerative spondylolysis at C6-7 the end more mild degenerative changes at C5-6. Upper chest:  Negative. IMPRESSION: CT BRAIN: Negative head CT.  No acute intracranial process  identified. CT CERVICAL SPINE: 1. No acute abnormality within the cervical spine. 2. Moderate degenerate spondylolysis at C6-7, with more minor changes at C5-6. 3. Diffuse mottling of the osseous structures, nonspecific, but diffuse osseous metastatic disease could have this appearance and is not excluded. Electronically Signed   By: Jeannine Boga M.D.   On: 02/02/2016 16:19   Ct Cervical Spine Wo Contrast  Result Date: 02/02/2016 CLINICAL DATA:  Initial evaluation for right arm numbness. No known injury. EXAM: CT HEAD WITHOUT CONTRAST CT CERVICAL SPINE WITHOUT CONTRAST TECHNIQUE: Multidetector CT imaging of the head and cervical spine was performed following the standard protocol without intravenous contrast. Multiplanar CT image reconstructions of the cervical spine were also generated. COMPARISON:  Prior CT from 03/12/2013. FINDINGS: CT HEAD FINDINGS Brain: Cerebral volume normal. No acute intracranial hemorrhage. No evidence for acute large vessel territory infarct. No mass lesion, midline shift or mass effect. No hydrocephalus. No extra-axial fluid collection. Vascular: No hyperdense vessel. Skull: Scalp soft tissues within normal limits.  Calvarium intact. Sinuses/Orbits: The globes and orbital soft tissues within normal limits. Mild scattered mucosal thickening within the sphenoid sinuses, ethmoidal air cells, and maxillary sinuses. No air-fluid level to suggest active sinus infection. No mastoid effusion. CT CERVICAL SPINE FINDINGS Alignment: Vertebral bodies are normally aligned.  No listhesis. Skull base and vertebrae: Skullbase intact. No skin abrasions are preserved. Dens is intact. Visualized osseous structures are diffusely mottled appearance. Vertebral body heights maintained. No fracture. Soft tissues and spinal canal: Visualized soft tissues demonstrate no acute abnormality. No prevertebral edema. Note made of a subcentimeter hypodense nodule within the right lobe of thyroid. Disc levels:  Mild to moderate degenerative spondylolysis at C6-7 the end more mild degenerative changes at C5-6. Upper chest: Negative. IMPRESSION: CT BRAIN: Negative head CT.  No acute intracranial process identified. CT CERVICAL SPINE: 1. No acute abnormality within the cervical spine. 2. Moderate degenerate spondylolysis at C6-7, with more minor changes at C5-6. 3. Diffuse mottling of the osseous structures, nonspecific, but diffuse osseous metastatic disease could have this appearance and is not excluded. Electronically Signed   By: Jeannine Boga M.D.   On: 02/02/2016 16:19   Dg Humerus Right  Result Date: 02/02/2016 CLINICAL DATA:  Upper arm pain over the last 4 days. No known injury. EXAM: RIGHT HUMERUS - 2+ VIEW COMPARISON:  04/26/2015 FINDINGS: There is no evidence of fracture or other focal bone lesions. Soft tissues are unremarkable. IMPRESSION: Normal Electronically Signed   By: Nelson Chimes M.D.   On: 02/02/2016 12:29    Procedures Procedures (including critical care time)  Medications Ordered in ED Medications  oxyCODONE-acetaminophen (PERCOCET/ROXICET) 5-325 MG per tablet 2 tablet (2 tablets Oral Given 02/02/16 1622)     Initial Impression / Assessment and Plan / ED Course  I have reviewed the triage vital signs and the nursing notes.  Pertinent labs & imaging results that were available during my care of the patient were reviewed by me and considered in my medical decision making (see chart for details).  Clinical Course     55 year old female with past medical history of metastatic breast cancer here with shooting right arm pain as well as intermittent arm swelling. Primary suspicion is acute radiculopathy, likely secondary to disc herniation versus metastatic bony disease. No weakness, numbness, large from his symptoms, or evidence of cord compression. DVT study obtained given hormone use and is negative. Labs are otherwise unremarkable.  CT head shows no metastatic disease or  abnormality. CT of the cervical spine shows possible osseous metastatic disease, versus osteoarthritis. I discussed with patient's oncologist, Dr. Jana Hakim. He is aware and will follow-up with the patient this week. No emergent intervention or change in therapy indicated according to home. Will place on analgesics, give good return precautions and discharge home.  Final Clinical Impressions(s) / ED Diagnoses   Final diagnoses:  Lower urinary tract infectious disease  Cervical pain  Metastatic breast cancer [facility])    New Prescriptions Discharge Medication List as of 02/02/2016  6:31 PM    START taking these medications   Details  gabapentin (NEURONTIN) 100 MG capsule Take 1 capsule (100 mg total) by mouth 3 (three) times daily., Starting Thu 02/02/2016, Until Sun 02/12/2016, Print    HYDROcodone-acetaminophen (NORCO/VICODIN) 5-325 MG tablet Take 1-2 tablets by mouth every 4 (four) hours as needed for severe pain., Starting Thu 02/02/2016, Print    naproxen (NAPROSYN) 375 MG tablet Take 1 tablet (375 mg total) by mouth 2 (two) times daily as needed for moderate pain., Starting Thu 02/02/2016, Until Thu 02/09/2016, Print         Duffy Bruce, MD 02/03/16 [phone]

## 2016-02-02 NOTE — Progress Notes (Signed)
*  Preliminary Results* Right upper extremity venous duplex completed. Right upper extremity is negative for deep and superficial vein thrombosis.  02/02/2016 5:58 PM  Maudry Mayhew, BS, RVT, RDCS, RDMS

## 2016-02-04 LAB — URINE CULTURE
Culture: <10000 — AB
Special Requests: NORMAL

## 2016-02-08 ENCOUNTER — Encounter: Payer: Self-pay | Admitting: Internal Medicine

## 2016-02-08 ENCOUNTER — Telehealth: Payer: Self-pay | Admitting: *Deleted

## 2016-02-08 ENCOUNTER — Other Ambulatory Visit (HOSPITAL_BASED_OUTPATIENT_CLINIC_OR_DEPARTMENT_OTHER): Payer: Medicare Other

## 2016-02-08 ENCOUNTER — Ambulatory Visit (HOSPITAL_BASED_OUTPATIENT_CLINIC_OR_DEPARTMENT_OTHER): Payer: Medicare Other

## 2016-02-08 ENCOUNTER — Ambulatory Visit (INDEPENDENT_AMBULATORY_CARE_PROVIDER_SITE_OTHER): Payer: Medicare Other | Admitting: Internal Medicine

## 2016-02-08 ENCOUNTER — Ambulatory Visit (INDEPENDENT_AMBULATORY_CARE_PROVIDER_SITE_OTHER): Payer: Medicare Other

## 2016-02-08 VITALS — BP 143/88 | HR 60 | Temp 98.2°F | Resp 20

## 2016-02-08 VITALS — BP 132/80 | HR 66 | Temp 98.6°F | Ht 67.0 in | Wt 268.0 lb

## 2016-02-08 VITALS — BP 132/80 | HR 66 | Temp 98.6°F | Ht 67.0 in | Wt 268.4 lb

## 2016-02-08 DIAGNOSIS — C7951 Secondary malignant neoplasm of bone: Secondary | ICD-10-CM

## 2016-02-08 DIAGNOSIS — I10 Essential (primary) hypertension: Secondary | ICD-10-CM

## 2016-02-08 DIAGNOSIS — R221 Localized swelling, mass and lump, neck: Secondary | ICD-10-CM | POA: Diagnosis not present

## 2016-02-08 DIAGNOSIS — M5417 Radiculopathy, lumbosacral region: Secondary | ICD-10-CM

## 2016-02-08 DIAGNOSIS — C50919 Malignant neoplasm of unspecified site of unspecified female breast: Secondary | ICD-10-CM | POA: Diagnosis not present

## 2016-02-08 DIAGNOSIS — C50812 Malignant neoplasm of overlapping sites of left female breast: Secondary | ICD-10-CM

## 2016-02-08 DIAGNOSIS — M5416 Radiculopathy, lumbar region: Secondary | ICD-10-CM

## 2016-02-08 DIAGNOSIS — Z0000 Encounter for general adult medical examination without abnormal findings: Secondary | ICD-10-CM

## 2016-02-08 DIAGNOSIS — E042 Nontoxic multinodular goiter: Secondary | ICD-10-CM

## 2016-02-08 DIAGNOSIS — H6123 Impacted cerumen, bilateral: Secondary | ICD-10-CM | POA: Diagnosis not present

## 2016-02-08 LAB — CBC WITH DIFFERENTIAL/PLATELET
BASO%: 1.2 % (ref 0.0–2.0)
Basophils Absolute: 0 10*3/uL (ref 0.0–0.1)
EOS%: 6.6 % (ref 0.0–7.0)
Eosinophils Absolute: 0.1 10*3/uL (ref 0.0–0.5)
HCT: 35.8 % (ref 34.8–46.6)
HGB: 11.6 g/dL (ref 11.6–15.9)
LYMPH%: 37.1 % (ref 14.0–49.7)
MCH: 28.5 pg (ref 25.1–34.0)
MCHC: 32.5 g/dL (ref 31.5–36.0)
MCV: 87.9 fL (ref 79.5–101.0)
MONO#: 0.1 10*3/uL (ref 0.1–0.9)
MONO%: 4.6 % (ref 0.0–14.0)
NEUT%: 50.5 % (ref 38.4–76.8)
NEUTROS ABS: 1 10*3/uL — AB (ref 1.5–6.5)
Platelets: 218 10*3/uL (ref 145–400)
RBC: 4.07 10*6/uL (ref 3.70–5.45)
RDW: 16.1 % — ABNORMAL HIGH (ref 11.2–14.5)
WBC: 2.1 10*3/uL — AB (ref 3.9–10.3)
lymph#: 0.8 10*3/uL — ABNORMAL LOW (ref 0.9–3.3)

## 2016-02-08 LAB — COMPREHENSIVE METABOLIC PANEL
ALK PHOS: 108 U/L (ref 40–150)
ALT: 7 U/L (ref 0–55)
AST: 10 U/L (ref 5–34)
Albumin: 3.5 g/dL (ref 3.5–5.0)
Anion Gap: 9 mEq/L (ref 3–11)
BILIRUBIN TOTAL: 1.06 mg/dL (ref 0.20–1.20)
BUN: 12.4 mg/dL (ref 7.0–26.0)
CALCIUM: 8.9 mg/dL (ref 8.4–10.4)
CO2: 23 mEq/L (ref 22–29)
CREATININE: 0.8 mg/dL (ref 0.6–1.1)
Chloride: 109 mEq/L (ref 98–109)
EGFR: >90 mL/min/{1.73_m2} (ref >90)
Glucose: 118 mg/dl (ref 70–140)
POTASSIUM: 4.3 meq/L (ref 3.5–5.1)
Sodium: 141 mEq/L (ref 136–145)
Total Protein: 7 g/dL (ref 6.4–8.3)

## 2016-02-08 MED ORDER — DENOSUMAB 120 MG/1.7ML ~~LOC~~ SOLN
120.0000 mg | Freq: Once | SUBCUTANEOUS | Status: AC
  Administered 2016-02-08: 120 mg via SUBCUTANEOUS
  Filled 2016-02-08: qty 1.7

## 2016-02-08 NOTE — Progress Notes (Signed)
Subjective:   Michaela Little is a 55 y.o. female who presents for an Initial Medicare Annual Wellness Visit.  Review of Systems     Cardiac Risk Factors include: advanced age (>69men, >61 women);family history of premature cardiovascular disease;hypertension;sedentary lifestyle     Objective:    Today's Vitals   02/08/16 1411 02/08/16 1413  BP: 132/80   Pulse: 66   Temp: 98.6 F (37 C)   TempSrc: Oral   SpO2: 98%   Weight: 268 lb 6.4 oz (121.7 kg)   Height: 5\' 7"  (1.702 m)   PainSc: 6  7    Body mass index is 42.04 kg/m.   Current Medications (verified) Outpatient Encounter Prescriptions as of 02/08/2016  Medication Sig  . cephALEXin (KEFLEX) 500 MG capsule Take 1 capsule (500 mg total) by mouth 3 (three) times daily.  . diphenhydrAMINE (BENADRYL) 25 mg capsule Take 25 mg by mouth every 6 (six) hours as needed for allergies.  Marland Kitchen gabapentin (NEURONTIN) 100 MG capsule Take 1 capsule (100 mg total) by mouth 3 (three) times daily.  Marland Kitchen HYDROcodone-acetaminophen (NORCO/VICODIN) 5-325 MG tablet Take 1-2 tablets by mouth every 4 (four) hours as needed for severe pain.  [name] 100 MG capsule TAKE 1 CAPSULE BY MOUTH DAILY FOR 21 DAYS THEN 7 DAYS REST. TAKE WHOLE WITH FOOD.  Marland Kitchen letrozole (FEMARA) 2.5 MG tablet Take 1 tablet (2.5 mg total) by mouth daily.  . metoprolol tartrate (LOPRESSOR) 25 MG tablet Take 1 tablet (25 mg total) by mouth 2 (two) times daily.  . naproxen (NAPROSYN) 375 MG tablet Take 1 tablet (375 mg total) by mouth 2 (two) times daily as needed for moderate pain.  . promethazine (PHENERGAN) 25 MG tablet Take 1 tablet (25 mg total) by mouth every 6 (six) hours as needed for nausea or vomiting.  . [DISCONTINUED] dicyclomine (BENTYL) 20 MG tablet Take 1 tablet (20 mg total) by mouth 2 (two) times daily. Take, as needed, for abdominal pain and cramping  . [DISCONTINUED] docusate sodium (COLACE) 100 MG capsule Take 1 capsule (100 mg total) by mouth every 12 (twelve)  hours. (Patient not taking: Reported on 12/02/2015)  . [DISCONTINUED] loratadine (CLARITIN) 10 MG tablet Take 1 tablet (10 mg total) by mouth daily.  . [DISCONTINUED] NONFORMULARY OR COMPOUNDED ITEM APPLY 1-2 GRAMS FOUR TIMES DAILY TO AFFECTED AREA(S)  -GABAPENTIN(T) 6% DICLOFENAC(T) 5% LIDOCAINE 5% DMSO 5% CRM  . [DISCONTINUED] phenazopyridine (PYRIDIUM) 200 MG tablet Take 1 tablet (200 mg total) by mouth 3 (three) times daily.  . [DISCONTINUED] Pseudoeph-Doxylamine-DM-APAP (NYQUIL PO) Take 30 mLs by mouth once as needed (cough/cold symptoms.).   No facility-administered encounter medications on file as of 02/08/2016.     Allergies (verified) Lisinopril-hydrochlorothiazide; Adhesive [tape]; Calcium; Calcium carbonate antacid; Hctz [hydrochlorothiazide]; Latex; Lisinopril; and Losartan potassium   History: Past Medical History:  Diagnosis Date  . Anxiety   . Arthritis    hands, knees, hips  . Asthma   . Cancer ([place])   . Cervical stenosis (uterine cervix)   . Dyspnea on exertion   . GERD (gastroesophageal reflux disease)   . Headache(784.0)   . History of breast cancer    2010--  LEFT  s/p  mastectomy (in [place]) AND CHEMORADIATION--  NO RECURRENCE  . History of cervical dysplasia   . History of TB skin testing    [place]--  [place]/ MED[place]  . Hyperlipidemia   . Hypertension   . OSA (obstructive sleep apnea) moderate osa per study  09/2010   CPAP  , NOT USING ON REGULAR BASIS  . Pelvic pain in female   . Positive H. pylori test    08-05-2013  . Refusal of blood transfusions as patient is Jehovah's Witness   . Seasonal allergies   . Uterine fibroid   . Wears glasses    Past Surgical History:  Procedure Laterality Date  . CARDIOVASCULAR STRESS TEST  10-29-2012   low risk perfusion study/  no significant reversibity/ ef 66%/  normal wall motion  . CERVICAL CONIZATION W/BX  2012   in  [place] N/A 05/14/2012   Procedure: LAPAROSCOPIC CHOLECYSTECTOMY;  Surgeon: Ralene Ok, MD;  Location: [place] [facility];  Service: General;  Laterality: N/A;  . COLONOSCOPY  2011   normal per patient - [place]  . DILATION AND CURETTAGE OF UTERUS  10/15/2011   Procedure: DILATATION AND CURETTAGE;  Surgeon: Melina Schools, MD;  Location: [facility];  Service: Gynecology;  Laterality: N/A;  Conization &  endocervical curettings  . EXAMINATION UNDER ANESTHESIA N/A 08/20/2013   Procedure: EXAM UNDER ANESTHESIA;  Surgeon: Margarette Asal, MD;  Location: [facility];  Service: Gynecology;  Laterality: N/A;  . Michaela Little   right  . LAPAROSCOPIC ASSISTED VAGINAL HYSTERECTOMY N/A 10/26/2014   Procedure: HYSTERECTOMY ABDOMINAL ;  Surgeon: Molli Posey, MD;  Location: [facility];  Service: Gynecology;  Laterality: N/A;  . MASTECTOMY Left 11/2008  in [place]   . SALPINGOOPHORECTOMY Bilateral 10/26/2014   Procedure: BILATERAL SALPINGO OOPHORECTOMY;  Surgeon: Molli Posey, MD;  Location: [facility];  Service: Gynecology;  Laterality: Bilateral;  . TRANSTHORACIC ECHOCARDIOGRAM  10-29-2012   mild lvh/  ef 60-65%/  grade II diastolic dysfunction/  trivial mr  &  tr   Family History  Problem Relation Age of Onset  . Breast cancer Mother   . Colon cancer Mother   . Hypotension Mother   . Asthma Mother   . Diabetes type II Mother   . Arthritis Mother   . Clotting disorder Mother   . Cancer Mother     breast/colon  . Mental illness Brother   . Heart disease Brother   . Cerebral palsy Daughter   . Emphysema Brother     never smoker  . Colon cancer Maternal Aunt   . Cancer Maternal Aunt     colon  . Colon cancer Maternal Uncle   . Cancer Maternal Uncle     colon  . Esophageal cancer Neg Hx   . Rectal cancer Neg Hx   . Stomach cancer Neg Hx   . Thyroid disease Neg Hx    Social History   Occupational History  . Disabled    Social History Main Topics  . Smoking status: Former Smoker    Packs/day: 0.30    Years: 10.00    Types: Cigarettes    Quit date:  03/26/1988  . Smokeless tobacco: Never Used  . Alcohol use No  . Drug use: No  . Sexual activity: Not Currently    Tobacco Counseling Counseling given: No   Activities of Daily Living In your present state of health, do you have any difficulty performing the following activities: 02/08/2016  Hearing? N  Vision? Y  Difficulty concentrating or making decisions? N  Walking or climbing stairs? N  Dressing or bathing? N  Doing errands, shopping? Y  Preparing Food and eating ? N  Using the Toilet? N  In the  past six months, have you accidently leaked urine? Y  Do you have problems with loss of bowel control? N  Managing your Medications? N  Managing your Finances? N  Housekeeping or managing your Housekeeping? N  Some recent data might be hidden    Immunizations and Health Maintenance Immunization History  Administered Date(s) Administered  . Influenza Split 12/25/2011  . Influenza,inj,Quad PF,36+ Mos 03/25/2013, 12/25/2013, 11/21/2015  . Pneumococcal Polysaccharide-23 03/06/2011   Health Maintenance Due  Topic Date Due  . Hepatitis C Screening  [birth year]  . HIV Screening  03/30/1973    Patient Care Team: Gildardo Cranker, DO as PCP - General (Internal Medicine) Blanchie Serve, MD as Referring Physician (Internal Medicine) Chauncey Cruel, MD as Consulting Physician (Oncology) Laureen Abrahams, RN as Registered Nurse (Oncology)  Indicate any recent Medical Services you may have received from other than [facility] providers in the past year (date may be approximate).     Assessment:   This is a routine wellness examination for D[place]ne.  Hearing/Vision screen Hearing Screening Comments: Pt has not had a hearing screen.  Vision Screening Comments: Last eye exam is June 2017.   Dietary issues and exercise activities discussed: Current Exercise Habits: Home exercise routine, Type of exercise: walking, Time (Minutes): 15, Frequency (Times/Week): 2, Weekly Exercise (Minutes/Week):  30, Intensity: Mild, Exercise limited by: None identified  Goals    . Weight (lb) < 250 lb (113.4 kg)          Starting 02/08/2016, I will attempt to decrease my current weight to under 250 lbs. over the next year.       Depression Screen PHQ 2/9 Scores 02/08/2016 11/21/2015 06/23/2013  PHQ - 2 Score 0 0 0    Fall Risk Fall Risk  02/08/2016 11/21/2015 11/12/2014 10/01/2014 06/30/2014  Falls in the past year? No Yes No No No  Number falls in past yr: - 1 - - -  Injury with Fall? - No - - -    Cognitive Function: MMSE - Mini Mental State Exam 02/08/2016  Orientation to time 5  Orientation to Place 5  Registration 3  Attention/ Calculation 5  Recall 1  Language- name 2 objects 2  Language- repeat 1  Language- follow 3 step command 3  Language- read & follow direction 1  Write a sentence 1  Copy design 1  Total score 28        Screening Tests Health Maintenance  Topic Date Due  . Hepatitis C Screening  1961-03-11  . HIV Screening  03/30/1973  . TETANUS/TDAP  03/22/2017 (Originally 03/30/1977)  . PAP SMEAR  02/26/2016  . MAMMOGRAM  11/22/2017  . COLONOSCOPY  04/17/2023  . INFLUENZA VACCINE  Completed      Plan:    I have personally reviewed and addressed the Medicare Annual Wellness questionnaire and have noted the following in the patient's chart:  A. Medical and social history B. Use of alcohol, tobacco or illicit drugs  C. Current medications and supplements D. Functional ability and status E.  Nutritional status F.  Physical activity G. Advance directives H. List of other physicians I.  Hospitalizations, surgeries, and ER visits in previous 12 months J.  [place] to include hearing, vision, cognitive, depression L. Referrals and appointments - none  In addition, I have reviewed and discussed with patient certain preventive protocols, quality metrics, and best practice recommendations. A written personalized care plan for preventive services as well  as general preventive health recommendations were provided  to patient.  See attached scanned questionnaire for additional information.   Signed,   Allyn Kenner, LPN Health Advisor  I have reviewed the health advisor's note and was available for consultation. I agree with documentation and plan.   Janisha Bueso S. Perlie Gold  [facility] [address] [place], [place] [address] [phone] Cell (Monday-Friday 8 AM - 5 PM) [phone] After 5 PM and follow prompts

## 2016-02-08 NOTE — Patient Instructions (Signed)
Encouraged her to exercise 30-45 minutes 4-5 times per week. Eat a well balanced diet. Avoid smoking. Limit alcohol intake. Wear seatbelt when riding in the car. Wear sun block (SPF >50) when spending extended times outside.  Continue current medications as ordered  Follow up in 4 mos for routine visit  Follow up with specialists as scheduled. Will call with ENT referral  Will call with lab results

## 2016-02-08 NOTE — Telephone Encounter (Signed)
This RN returned call per transferred VM from pt stating " I do not know why I need to have my blood rechecked "  Note per prior call - pt needs to hold Ibrance restart due to low ANC and have lab rechecked in 1 week for possible restart.  This RN left message on identified VM and per above call again received VM and message left.

## 2016-02-08 NOTE — Patient Instructions (Signed)
Ms. Runkle , Thank you for taking time to come for your Medicare Wellness Visit. I appreciate your ongoing commitment to your health goals. Please review the following plan we discussed and let me know if I can assist you in the future.   These are the goals we discussed: Goals    None      This is a list of the screening recommended for you and due dates:  Health Maintenance  Topic Date Due  .  Hepatitis C: One time screening is recommended by Center for Disease Control  (CDC) for  adults born from [birth year] through 1967.   1960-10-24  . HIV Screening  03/30/1973  . Tetanus Vaccine  03/30/1977  . Mammogram  07/22/2015  . Pap Smear  02/26/2016  . Colon Cancer Screening  04/17/2023  . Flu Shot  Completed  Preventive Care for Adults  A healthy lifestyle and preventive care can promote health and wellness. Preventive health guidelines for adults include the following key practices.  . A routine yearly physical is a good way to check with your health care provider about your health and preventive screening. It is a chance to share any concerns and updates on your health and to receive a thorough exam.  . Visit your dentist for a routine exam and preventive care every 6 months. Brush your teeth twice a day and floss once a day. Good oral hygiene prevents tooth decay and gum disease.  . The frequency of eye exams is based on your age, health, family medical history, use  of contact lenses, and other factors. Follow your health care provider's ecommendations for frequency of eye exams.  . Eat a healthy diet. Foods like vegetables, fruits, whole grains, low-fat dairy products, and lean protein foods contain the nutrients you need without too many calories. Decrease your intake of foods high in solid fats, added sugars, and salt. Eat the right amount of calories for you. Get information about a proper diet from your health care provider, if necessary.  . Regular physical exercise is one of the most  important things you can do for your health. Most adults should get at least 150 minutes of moderate-intensity exercise (any activity that increases your heart rate and causes you to sweat) each week. In addition, most adults need muscle-strengthening exercises on 2 or more days a week.  Silver Sneakers may be a benefit available to you. To determine eligibility, you may visit the website: www.silversneakers.com or contact program at 4088379150 Mon-Fri between 8AM-8PM.   . Maintain a healthy weight. The body mass index (BMI) is a screening tool to identify possible weight problems. It provides an estimate of body fat based on height and weight. Your health care provider can find your BMI and can help you achieve or maintain a healthy weight.   For adults 20 years and older: ? A BMI below 18.5 is considered underweight. ? A BMI of 18.5 to 24.9 is normal. ? A BMI of 25 to 29.9 is considered overweight. ? A BMI of 30 and above is considered obese.   . Maintain normal blood lipids and cholesterol levels by exercising and minimizing your intake of saturated fat. Eat a balanced diet with plenty of fruit and vegetables. Blood tests for lipids and cholesterol should begin at age 40 and be repeated every 5 years. If your lipid or cholesterol levels are high, you are over 50, or you are at high risk for heart disease, you may need your cholesterol levels  checked more frequently. Ongoing high lipid and cholesterol levels should be treated with medicines if diet and exercise are not working.  . If you smoke, find out from your health care provider how to quit. If you do not use tobacco, please do not start.  . If you choose to drink alcohol, please do not consume more than 2 drinks per day. One drink is considered to be 12 ounces (355 mL) of beer, 5 ounces (148 mL) of wine, or 1.5 ounces (44 mL) of liquor.  . If you are 40-47 years old, ask your health care provider if you should take aspirin to prevent  strokes.  . Use sunscreen. Apply sunscreen liberally and repeatedly throughout the day. You should seek shade when your shadow is shorter than you. Protect yourself by wearing long sleeves, pants, a wide-brimmed hat, and sunglasses year round, whenever you are outdoors.  . Once a month, do a whole body skin exam, using a mirror to look at the skin on your back. Tell your health care provider of new moles, moles that have irregular borders, moles that are larger than a pencil eraser, or moles that have changed in shape or color.

## 2016-02-08 NOTE — Patient Instructions (Signed)
Denosumab injection  What is this medicine?  DENOSUMAB (den oh sue mab) slows bone breakdown. Prolia is used to treat osteoporosis in women after menopause and in men. Xgeva is used to prevent bone fractures and other bone problems caused by cancer bone metastases. Xgeva is also used to treat giant cell tumor of the bone.  This medicine may be used for other purposes; ask your health care provider or pharmacist if you have questions.  What should I tell my health care provider before I take this medicine?  They need to know if you have any of these conditions:  -dental disease  -eczema  -infection or history of infections  -kidney disease or on dialysis  -low blood calcium or vitamin D  -malabsorption syndrome  -scheduled to have surgery or tooth extraction  -taking medicine that contains denosumab  -thyroid or parathyroid disease  -an unusual reaction to denosumab, other medicines, foods, dyes, or preservatives  -pregnant or trying to get pregnant  -breast-feeding  How should I use this medicine?  This medicine is for injection under the skin. It is given by a health care professional in a hospital or clinic setting.  If you are getting Prolia, a special MedGuide will be given to you by the pharmacist with each prescription and refill. Be sure to read this information carefully each time.  For Prolia, talk to your pediatrician regarding the use of this medicine in children. Special care may be needed. For Xgeva, talk to your pediatrician regarding the use of this medicine in children. While this drug may be prescribed for children as young as 13 years for selected conditions, precautions do apply.  Overdosage: If you think you have taken too much of this medicine contact a poison control center or emergency room at once.  NOTE: This medicine is only for you. Do not share this medicine with others.  What if I miss a dose?  It is important not to miss your dose. Call your doctor or health care professional if you are  unable to keep an appointment.  What may interact with this medicine?  Do not take this medicine with any of the following medications:  -other medicines containing denosumab  This medicine may also interact with the following medications:  -medicines that suppress the immune system  -medicines that treat cancer  -steroid medicines like prednisone or cortisone  This list may not describe all possible interactions. Give your health care provider a list of all the medicines, herbs, non-prescription drugs, or dietary supplements you use. Also tell them if you smoke, drink alcohol, or use illegal drugs. Some items may interact with your medicine.  What should I watch for while using this medicine?  Visit your doctor or health care professional for regular checks on your progress. Your doctor or health care professional may order blood tests and other tests to see how you are doing.  Call your doctor or health care professional if you get a cold or other infection while receiving this medicine. Do not treat yourself. This medicine may decrease your body's ability to fight infection.  You should make sure you get enough calcium and vitamin D while you are taking this medicine, unless your doctor tells you not to. Discuss the foods you eat and the vitamins you take with your health care professional.  See your dentist regularly. Brush and floss your teeth as directed. Before you have any dental work done, tell your dentist you are receiving this medicine.  Do   not become pregnant while taking this medicine or for 5 months after stopping it. Women should inform their doctor if they wish to become pregnant or think they might be pregnant. There is a potential for serious side effects to an unborn child. Talk to your health care professional or pharmacist for more information.  What side effects may I notice from receiving this medicine?  Side effects that you should report to your doctor or health care professional as soon as  possible:  -allergic reactions like skin rash, itching or hives, swelling of the face, lips, or tongue  -breathing problems  -chest pain  -fast, irregular heartbeat  -feeling faint or lightheaded, falls  -fever, chills, or any other sign of infection  -muscle spasms, tightening, or twitches  -numbness or tingling  -skin blisters or bumps, or is dry, peels, or red  -slow healing or unexplained pain in the mouth or jaw  -unusual bleeding or bruising  Side effects that usually do not require medical attention (Report these to your doctor or health care professional if they continue or are bothersome.):  -muscle pain  -stomach upset, gas  This list may not describe all possible side effects. Call your doctor for medical advice about side effects. You may report side effects to FDA at 1-800-FDA-1088.  Where should I keep my medicine?  This medicine is only given in a clinic, doctor's office, or other health care setting and will not be stored at home.  NOTE: This sheet is a summary. It may not cover all possible information. If you have questions about this medicine, talk to your doctor, pharmacist, or health care provider.      2016, [employer]vier/Gold Standard. (2011-09-10 12:37:47)

## 2016-02-08 NOTE — Progress Notes (Addendum)
Patient ID: Michaela Little, female   DOB: Feb 26, 1961, 55 y.o.   MRN: [id number]   Location:  [facility]  Place of Service:  OFFICE  Provider: Arletha Grippe, DO  Patient Care Team: Gildardo Cranker, DO as PCP - General (Internal Medicine) Blanchie Serve, MD as Referring Physician (Internal Medicine) Chauncey Cruel, MD as Consulting Physician (Oncology) Laureen Abrahams, RN as Registered Nurse (Oncology)  Extended Emergency Contact Information Primary Emergency Contact: [place] Address: [address]           [place], [place] [address] [name] of [place] Phone: [phone] Mobile Phone: [phone] Relation: Daughter Secondary Emergency Contact: Renaldo Reel, Maish [place] of [place] Phone: [phone] Relation: Relative  Code Status: FULL CODE Goals of Care: Advanced Directive information Advanced Directives 02/08/2016  Does patient have an advance directive? No  Does patient want to make changes to advanced directive? -  Would patient like information on creating an advanced directive? No - patient declined information  Pre-existing out of facility DNR order (yellow form or pink MOST form) -     Chief Complaint  Patient presents with  . Medicare Wellness    Yearly Exam    HPI: Patient is a 55 y.o. female seen in today for an annual wellness exam.    HTN - takes amlodipine. No new swelling, CP or SOB  Hyperlipidemia - diet controlled; makes healthy food choices  Seasonal allergy/asthma - stable. No recent sinusitis. 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 Occasional hot flashes.she was seen in the ED on 1/9th for right shoulder numbness and neck pain. CT Cervical spine revealed possible osseous mets. Pt c/o "multiplying" nodules in  neck causing pain. She would like a bx.  She is scheduled for PET scan in Dec 2017. Followed by Dr Jana Hakim H/O  Depression screen [facility] 2/9 02/08/2016 11/21/2015 06/23/2013  Decreased Interest 0 0 0  Down, Depressed, Hopeless 0 0 0  PHQ - 2 Score 0 0 0  Some recent data might be hidden    Fall Risk  02/08/2016 11/21/2015 11/12/2014 10/01/2014 06/30/2014  Falls in the past year? No Yes No No No  Number falls in past yr: - 1 - - -  Injury with Fall? - No - - -   MMSE - Mini Mental State Exam 02/08/2016  Orientation to time 5  Orientation to Place 5  Registration 3  Attention/ Calculation 5  Recall 1  Language- name 2 objects 2  Language- repeat 1  Language- follow 3 step command 3  Language- read & follow direction 1  Write a sentence 1  Copy design 1  Total score 28     Health Maintenance  Topic Date Due  . Hepatitis C Screening  [birth year]  . HIV Screening  03/30/1973  . TETANUS/TDAP  03/22/2017 (Originally 03/30/1977)  . PAP SMEAR  02/26/2016  . MAMMOGRAM  11/22/2017  . COLONOSCOPY  04/17/2023  . INFLUENZA VACCINE  Completed    Urinary incontinence? none  Functional Status Survey: Is the patient deaf or have difficulty hearing?: No Does the patient have difficulty seeing, even when wearing glasses/contacts?: Yes Does the patient have difficulty concentrating, remembering, or making decisions?: No Does the patient have  difficulty walking or climbing stairs?: No Does the patient have difficulty dressing or bathing?: No Does the patient have difficulty doing errands alone such as visiting a doctor's office or shopping?: Yes  Exercise? As tolerated  Diet? She has a N/V related to chemotx and eats whenever she can tolerate it  No exam data present  Hearing: intact    Dentition: followed by dentist  Pain; in her neck and bones.  Past Medical History:  Diagnosis Date  . Anxiety   . Arthritis    hands, knees, hips  . Asthma   . Cancer ([place])   . Cervical stenosis  (uterine cervix)   . Dyspnea on exertion   . GERD (gastroesophageal reflux disease)   . Headache(784.0)   . History of breast cancer    2010--  LEFT  s/p  mastectomy (in [place]) AND CHEMORADIATION--  NO RECURRENCE  . History of cervical dysplasia   . History of TB skin testing    AS TEEN--  [place] W/ MEDS  . Hyperlipidemia   . Hypertension   . OSA (obstructive sleep apnea) moderate osa per study  09/2010   CPAP  , NOT USING ON REGULAR BASIS  . Pelvic pain in female   . Positive H. pylori test    08-05-2013  . Refusal of blood transfusions as patient is Jehovah's Witness   . Seasonal allergies   . Uterine fibroid   . Wears glasses     Past Surgical History:  Procedure Laterality Date  . CARDIOVASCULAR STRESS TEST  10-29-2012   low risk perfusion study/  no significant reversibity/ ef 66%/  normal wall motion  . CERVICAL CONIZATION W/BX  2012   in  [place] N/A 05/14/2012   Procedure: LAPAROSCOPIC CHOLECYSTECTOMY;  Surgeon: Ralene Ok, MD;  Location: [facility];  Service: General;  Laterality: N/A;  . COLONOSCOPY  2011   normal per patient - [place]  . DILATION AND CURETTAGE OF UTERUS  10/15/2011   Procedure: DILATATION AND CURETTAGE;  Surgeon: Melina Schools, MD;  Location: [facility];  Service: Gynecology;  Laterality: N/A;  Conization &  endocervical curettings  . EXAMINATION UNDER ANESTHESIA N/A 08/20/2013   Procedure: EXAM UNDER ANESTHESIA;  Surgeon: Margarette Asal, MD;  Location: [facility];  Service: Gynecology;  Laterality: N/A;  . [place]   right  . LAPAROSCOPIC ASSISTED VAGINAL HYSTERECTOMY N/A 10/26/2014   Procedure: HYSTERECTOMY ABDOMINAL ;  Surgeon: Molli Posey, MD;  Location: [facility];  Service: Gynecology;  Laterality: N/A;  . MASTECTOMY Left 11/2008  in [place]   . SALPINGOOPHORECTOMY Bilateral 10/26/2014   Procedure: BILATERAL SALPINGO OOPHORECTOMY;  Surgeon: Molli Posey, MD;  Location: [facility];  Service: Gynecology;   Laterality: Bilateral;  . TRANSTHORACIC ECHOCARDIOGRAM  10-29-2012   mild lvh/  ef 60-65%/  grade II diastolic dysfunction/  trivial mr  &  tr    Family History  Problem Relation Age of Onset  . Breast cancer Mother   . Colon cancer Mother   . Hypotension Mother   . Asthma Mother   . Diabetes type II Mother   . Arthritis Mother   . Clotting disorder Mother   . Cancer Mother     breast/colon  . Mental illness Brother   . Heart disease Brother   . Cerebral palsy Daughter   . Emphysema Brother     never smoker  . Colon cancer Maternal Aunt   .  Cancer Maternal Aunt     colon  . Colon cancer Maternal Uncle   . Cancer Maternal Uncle     colon  . Esophageal cancer Neg Hx   . Rectal cancer Neg Hx   . Stomach cancer Neg Hx   . Thyroid disease Neg Hx    Family Status  Relation Status  . Mother Deceased at age 50  . Brother Alive  . Brother Alive  . Daughter Alive  . Brother Deceased  . Son Alive  . Daughter Alive  . Daughter Alive  . Daughter Alive  . Maternal Aunt   . Maternal Uncle   . Neg Hx     shoulder  Social History   Social History  . Marital status: Single    Spouse name: N/A  . Number of children: 5  . Years of education: N/A   Occupational History  . Disabled    Social History Main Topics  . Smoking status: Former Smoker    Packs/day: 0.30    Years: 10.00    Types: Cigarettes    Quit date: 03/26/1988  . Smokeless tobacco: Never Used  . Alcohol use No  . Drug use: No  . Sexual activity: Not Currently   Other Topics Concern  . Not on file   Social History Narrative  . No narrative on file    Allergies  Allergen Reactions  . Lisinopril-Hydrochlorothiazide Itching  . Adhesive [Tape] Itching and Other (See Comments)    Redness  . Calcium Hives    Fruit flavored Tums (cannot take)   . Calcium Carbonate Antacid Hives    Fruit flavored Tums   . Hctz [Hydrochlorothiazide] Itching  . Latex Itching  . Lisinopril Itching  . Losartan  Potassium Other (See Comments)    Makes her feel "bad"       Medication List       Accurate as of 02/08/16  3:08 PM. Always use your most recent med list.          cephALEXin 500 MG capsule Commonly known as:  KEFLEX Take 1 capsule (500 mg total) by mouth 3 (three) times daily.   diphenhydrAMINE 25 mg capsule Commonly known as:  BENADRYL Take 25 mg by mouth every 6 (six) hours as needed for allergies.   gabapentin 100 MG capsule Commonly known as:  NEURONTIN Take 1 capsule (100 mg total) by mouth 3 (three) times daily.   HYDROcodone-acetaminophen 5-325 MG tablet Commonly known as:  NORCO/VICODIN Take 1-2 tablets by mouth every 4 (four) hours as needed for severe pain.   IBRANCE 100 MG capsule Generic drug:  palbociclib TAKE 1 CAPSULE BY MOUTH DAILY FOR 21 DAYS THEN 7 DAYS REST. TAKE WHOLE WITH FOOD.   letrozole 2.5 MG tablet Commonly known as:  FEMARA Take 1 tablet (2.5 mg total) by mouth daily.   metoprolol tartrate 25 MG tablet Commonly known as:  LOPRESSOR Take 1 tablet (25 mg total) by mouth 2 (two) times daily.   naproxen 375 MG tablet Commonly known as:  NAPROSYN Take 1 tablet (375 mg total) by mouth 2 (two) times daily as needed for moderate pain.   promethazine 25 MG tablet Commonly known as:  PHENERGAN Take 1 tablet (25 mg total) by mouth every 6 (six) hours as needed for nausea or vomiting.        Review of Systems:  Review of Systems  Unable to perform ROS: Other (short term memory loss)    Physical Exam: Vitals:  02/08/16 1435  BP: 132/80  Pulse: 66  Temp: 98.6 F (37 C)  TempSrc: Oral  SpO2: 98%  Weight: 268 lb (121.6 kg)  Height: 5\' 7"  (1.702 m)   Body mass index is 41.97 kg/m. Physical Exam  Constitutional: She is oriented to person, place, and time. She appears well-developed and well-nourished. No distress.  HENT:  Head: Normocephalic and atraumatic.  Right Ear: Hearing, tympanic membrane, external ear and ear canal  normal.  Left Ear: Hearing, tympanic membrane, external ear and ear canal normal.  Mouth/Throat: Uvula is midline, oropharynx is clear and moist and mucous membranes are normal. She does not have dentures.  B/l cerumen impaction  Eyes: Conjunctivae, EOM and lids are normal. Pupils are equal, round, and reactive to light. No scleral icterus.  Neck: Trachea normal and normal range of motion. Neck supple. Carotid bruit is not present. Thyromegaly present. No thyroid mass present.  Cardiovascular: Normal rate, regular rhythm, normal heart sounds and intact distal pulses.  Exam reveals no gallop and no friction rub.   No murmur heard. No carotid bruit b/l. No LE edema b/l. No calf TTP.   Pulmonary/Chest: Effort normal and breath sounds normal. She has no wheezes. She has no rhonchi. She has no rales. Breasts are symmetrical.    Abdominal: Soft. Normal appearance, normal aorta and bowel sounds are normal. She exhibits no pulsatile midline mass and no mass. There is no hepatosplenomegaly. There is no tenderness. There is no rigidity, no rebound and no guarding. No hernia.  Musculoskeletal: Normal range of motion. She exhibits edema and tenderness (right anterior pelivis lump TTP).  Lymphadenopathy:       Head (right side): Posterior auricular and occipital adenopathy present.       Head (left side): Posterior auricular and occipital adenopathy present.    She has cervical adenopathy.       Right cervical: Superficial cervical and deep cervical adenopathy present.       Left cervical: Superficial cervical and deep cervical adenopathy present.    She has no axillary adenopathy.       Right: No supraclavicular adenopathy present.       Left: No supraclavicular adenopathy present.  Neurological: She is alert and oriented to person, place, and time. She has normal strength and normal reflexes. No cranial nerve deficit. Gait normal.  Skin: Skin is warm, dry and intact. No rash noted. Nails show no  clubbing.  Psychiatric: She has a normal mood and affect. Her speech is normal and behavior is normal. Thought content normal. Cognition and memory are normal.    Labs reviewed:  Basic Metabolic Panel:  Recent Labs  04/17/15 2041  12/02/15 1946  01/11/16 1042 02/02/16 1517 02/08/16 1035  NA 139  < > 139  < > 141 138 141  K 3.9  < > 4.1  < > 4.0 4.3 4.3  CL 105  --  104  --   --  105  --   CO2 24  < > 27  < > 24 23 23   GLUCOSE 92  < > 94  < > 131 108* 118  BUN 10  < > 14  < > 7.9 10 12.4  CREATININE 0.80  < > 0.74  < > 0.7 0.78 0.8  CALCIUM 9.4  < > 9.4  < > 8.6 9.5 8.9  < > = values in this interval not displayed. Liver Function Tests:  Recent Labs  12/14/15 1102 01/11/16 1042 02/08/16 1035  AST 17  15 10  ALT 13 7 7   ALKPHOS 132 122 108  BILITOT 0.90 0.94 1.06  PROT 7.3 7.0 7.0  ALBUMIN 3.5 3.5 3.5    Recent Labs  12/02/15 1946  LIPASE 21   No results for input(s): AMMONIA in the last 8760 hours. CBC:  Recent Labs  01/11/16 1042 02/02/16 1517 02/08/16 1036  WBC 2.4* 3.0* 2.1*  NEUTROABS 1.2* 1.5* 1.0*  HGB 11.7 11.9* 11.6  HCT 35.1 36.0 35.8  MCV 86.5 86.7 87.9  PLT 190 200 218   Lipid Panel:  Recent Labs  11/21/15 1047  CHOL 213*  HDL 46  LDLCALC 148*  TRIG 96  CHOLHDL 4.6   No results found for: HGBA1C  Procedures: Dg Chest 2 View  Result Date: 02/02/2016 CLINICAL DATA:  Bilateral shoulder pain for 4 days, right shoulder pain radiates into right arm, right hand swollen and numb, has been stumbling when walking as well, SOB at times - hx of left side breast cancer, asthma, , htn, OSA, GERD EXAM: CHEST  2 VIEW COMPARISON:  04/17/2015 FINDINGS: Cardiac silhouette is top-normal in size. No mediastinal or hilar masses. No evidence of adenopathy. Clear lungs.  No pleural effusion or pneumothorax. Stable changes from left breast surgery. Skeletal structures are intact. IMPRESSION: No active cardiopulmonary disease. Electronically Signed   By:  Lajean Manes M.D.   On: 02/02/2016 15:40   Ct Head Wo Contrast  Result Date: 02/02/2016 CLINICAL DATA:  Initial evaluation for right arm numbness. No known injury. EXAM: CT HEAD WITHOUT CONTRAST CT CERVICAL SPINE WITHOUT CONTRAST TECHNIQUE: Multidetector CT imaging of the head and cervical spine was performed following the standard protocol without intravenous contrast. Multiplanar CT image reconstructions of the cervical spine were also generated. COMPARISON:  Prior CT from 03/12/2013. FINDINGS: CT HEAD FINDINGS Brain: Cerebral volume normal. No acute intracranial hemorrhage. No evidence for acute large vessel territory infarct. No mass lesion, midline shift or mass effect. No hydrocephalus. No extra-axial fluid collection. Vascular: No hyperdense vessel. Skull: Scalp soft tissues within normal limits.  Calvarium intact. Sinuses/Orbits: The globes and orbital soft tissues within normal limits. Mild scattered mucosal thickening within the sphenoid sinuses, ethmoidal air cells, and maxillary sinuses. No air-fluid level to suggest active sinus infection. No mastoid effusion. CT CERVICAL SPINE FINDINGS Alignment: Vertebral bodies are normally aligned.  No listhesis. Skull base and vertebrae: Skullbase intact. No skin abrasions are preserved. Dens is intact. Visualized osseous structures are diffusely mottled appearance. Vertebral body heights maintained. No fracture. Soft tissues and spinal canal: Visualized soft tissues demonstrate no acute abnormality. No prevertebral edema. Note made of a subcentimeter hypodense nodule within the right lobe of thyroid. Disc levels: Mild to moderate degenerative spondylolysis at C6-7 the end more mild degenerative changes at C5-6. Upper chest: Negative. IMPRESSION: CT BRAIN: Negative head CT.  No acute intracranial process identified. CT CERVICAL SPINE: 1. No acute abnormality within the cervical spine. 2. 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 3. Diffuse mottling of the osseous structures, nonspecific, but diffuse osseous metastatic disease could have this appearance and is not excluded. Electronically Signed   By: Jeannine Boga M.D.   On: 02/02/2016 16:19   Ct Cervical Spine Wo Contrast  Result Date: 02/02/2016 CLINICAL DATA:  Initial evaluation for right arm numbness. No known injury. EXAM: CT HEAD WITHOUT CONTRAST CT CERVICAL SPINE WITHOUT CONTRAST TECHNIQUE: Multidetector CT imaging of the head and cervical spine was performed following the standard protocol without intravenous contrast.  Multiplanar CT image reconstructions of the cervical spine were also generated. COMPARISON:  Prior CT from 03/12/2013. FINDINGS: CT HEAD FINDINGS Brain: Cerebral volume normal. No acute intracranial hemorrhage. No evidence for acute large vessel territory infarct. No mass lesion, midline shift or mass effect. No hydrocephalus. No extra-axial fluid collection. Vascular: No hyperdense vessel. Skull: Scalp soft tissues within normal limits.  Calvarium intact. Sinuses/Orbits: The globes and orbital soft tissues within normal limits. Mild scattered mucosal thickening within the sphenoid sinuses, ethmoidal air cells, and maxillary sinuses. No air-fluid level to suggest active sinus infection. No mastoid effusion. CT CERVICAL SPINE FINDINGS Alignment: Vertebral bodies are normally aligned.  No listhesis. Skull base and vertebrae: Skullbase intact. No skin abrasions are preserved. Dens is intact. Visualized osseous structures are diffusely mottled appearance. Vertebral body heights maintained. No fracture. Soft tissues and spinal canal: Visualized soft tissues demonstrate no acute abnormality. No prevertebral edema. Note made of a subcentimeter hypodense nodule within the right lobe of thyroid. Disc levels: Mild to moderate degenerative spondylolysis at C6-7 the end more mild degenerative changes at C5-6. Upper chest: Negative. IMPRESSION: CT BRAIN: Negative  head CT.  No acute intracranial process identified. CT CERVICAL SPINE: 1. No acute abnormality within the cervical spine. 2. Moderate degenerate spondylolysis at C6-7, with more minor changes at C5-6. 3. Diffuse mottling of the osseous structures, nonspecific, but diffuse osseous metastatic disease could have this appearance and is not excluded. Electronically Signed   By: Jeannine Boga M.D.   On: 02/02/2016 16:19   Dg Humerus Right  Result Date: 02/02/2016 CLINICAL DATA:  Upper arm pain over the last 4 days. No known injury. EXAM: RIGHT HUMERUS - 2+ VIEW COMPARISON:  04/26/2015 FINDINGS: There is no evidence of fracture or other focal bone lesions. Soft tissues are unremarkable. IMPRESSION: Normal Electronically Signed   By: Nelson Chimes M.D.   On: 02/02/2016 12:29   ECG FROM ED VISIT 01/2016 REVIEWED: no acute ischemic changes  Assessment/Plan    Pt is UTD on health maintenance. Vaccinations are UTD. Pt maintains a healthy lifestyle. Encouraged pt to exercise 30-45 minutes 4-5 times per week. Eat a well balanced diet. Avoid smoking. Limit alcohol intake. Wear seatbelt when riding in the car. Wear sun block (SPF >50) when spending extended times outside.  Continue current medications as ordered  Follow up in 4 mos for routine visit  Follow up with specialists as scheduled. Will call with ENT referral  Will call with lab results  Ear lavage performed today without complication. Large amt of cerumen removed b/l   Durant Scibilia S. Perlie Gold  [facility] [address] [place], [place] [address] [phone] Cell (Monday-Friday 8 AM - 5 PM) [phone] After 5 PM and follow prompts

## 2016-02-08 NOTE — Telephone Encounter (Signed)
This RN left message on pt's identified VM requesting a return call regarding lab values today and current therapy with Ibrance.  ANC 1 with need to hold restart and recheck in 1 week.  Request for appointment sent via in box.

## 2016-02-08 NOTE — Progress Notes (Signed)
Quick Notes   Health Maintenance:   Due for HIV and Hep C. Pt will check w/ insurance about out of pocket expense for Tdap. MMG- done 10/2015   Abnormal Screen:  None, MMSE-28/30 Passed Clock test   Patient Concerns:   None   Nurse Concerns:   None

## 2016-02-09 ENCOUNTER — Telehealth: Payer: Self-pay | Admitting: Emergency Medicine

## 2016-02-09 ENCOUNTER — Other Ambulatory Visit: Payer: Self-pay | Admitting: Oncology

## 2016-02-09 LAB — T4, FREE: FREE T4: 1.1 ng/dL (ref 0.8–1.8)

## 2016-02-09 LAB — TSH: TSH: 0.98 mIU/L

## 2016-02-09 NOTE — Telephone Encounter (Signed)
Left message on patient's voicemail instructing her to continue holding her [name] and return on 11/22 for repeat lab work. Once labs are repeated in a week patient will be given further instructions on restarting Ibrance.

## 2016-02-15 ENCOUNTER — Other Ambulatory Visit: Payer: Medicare Other

## 2016-02-17 ENCOUNTER — Telehealth: Payer: Self-pay | Admitting: *Deleted

## 2016-02-17 NOTE — Telephone Encounter (Signed)
This RN attempted  °

## 2016-02-17 NOTE — Telephone Encounter (Signed)
This RN called pt per need to follow up on low blood values and need to recheck lab for Ibrance restart.  Michaela Little states she has been out of town and has attempted to reschedule lab for earlier this week.  She will return by 11/30- appointment made by this RN.  Michaela Little will remain off Ibrance until labs are checked for Michaela Little recovery.

## 2016-02-23 ENCOUNTER — Other Ambulatory Visit: Payer: Medicare Other

## 2016-02-23 ENCOUNTER — Other Ambulatory Visit (HOSPITAL_BASED_OUTPATIENT_CLINIC_OR_DEPARTMENT_OTHER): Payer: Medicare Other

## 2016-02-23 DIAGNOSIS — C7951 Secondary malignant neoplasm of bone: Secondary | ICD-10-CM | POA: Diagnosis not present

## 2016-02-23 DIAGNOSIS — C50812 Malignant neoplasm of overlapping sites of left female breast: Secondary | ICD-10-CM

## 2016-02-23 LAB — CBC WITH DIFFERENTIAL/PLATELET
BASO%: 1.2 % (ref 0.0–2.0)
Basophils Absolute: 0 10*3/uL (ref 0.0–0.1)
EOS ABS: 0.2 10*3/uL (ref 0.0–0.5)
EOS%: 6.4 % (ref 0.0–7.0)
HCT: 33.6 % — ABNORMAL LOW (ref 34.8–46.6)
HGB: 11.2 g/dL — ABNORMAL LOW (ref 11.6–15.9)
LYMPH%: 29.2 % (ref 14.0–49.7)
MCH: 29 pg (ref 25.1–34.0)
MCHC: 33.3 g/dL (ref 31.5–36.0)
MCV: 87 fL (ref 79.5–101.0)
MONO#: 0.3 10*3/uL (ref 0.1–0.9)
MONO%: 8.7 % (ref 0.0–14.0)
NEUT#: 1.9 10*3/uL (ref 1.5–6.5)
NEUT%: 54.5 % (ref 38.4–76.8)
PLATELETS: 221 10*3/uL (ref 145–400)
RBC: 3.86 10*6/uL (ref 3.70–5.45)
RDW: 15.7 % — ABNORMAL HIGH (ref 11.2–14.5)
WBC: 3.4 10*3/uL — AB (ref 3.9–10.3)
lymph#: 1 10*3/uL (ref 0.9–3.3)

## 2016-02-23 LAB — COMPREHENSIVE METABOLIC PANEL
ALT: 12 U/L (ref 0–55)
AST: 17 U/L (ref 5–34)
Albumin: 3.4 g/dL — ABNORMAL LOW (ref 3.5–5.0)
Alkaline Phosphatase: 97 U/L (ref 40–150)
Anion Gap: 6 mEq/L (ref 3–11)
BUN: 14 mg/dL (ref 7.0–26.0)
CALCIUM: 9 mg/dL (ref 8.4–10.4)
CHLORIDE: 110 meq/L — AB (ref 98–109)
CO2: 25 mEq/L (ref 22–29)
Creatinine: 0.7 mg/dL (ref 0.6–1.1)
EGFR: >90 mL/min/{1.73_m2} (ref >90)
GLUCOSE: 111 mg/dL (ref 70–140)
POTASSIUM: 4.2 meq/L (ref 3.5–5.1)
SODIUM: 142 meq/L (ref 136–145)
Total Bilirubin: 1.22 mg/dL — ABNORMAL HIGH (ref 0.20–1.20)
Total Protein: 6.9 g/dL (ref 6.4–8.3)

## 2016-02-25 DIAGNOSIS — C50919 Malignant neoplasm of unspecified site of unspecified female breast: Secondary | ICD-10-CM | POA: Diagnosis not present

## 2016-03-01 ENCOUNTER — Ambulatory Visit (HOSPITAL_COMMUNITY)
Admission: RE | Admit: 2016-03-01 | Discharge: 2016-03-01 | Disposition: A | Discharge status: Home / Self Care | Payer: Medicare Other | Source: Ambulatory Visit | Attending: Oncology | Admitting: Oncology

## 2016-03-01 DIAGNOSIS — Z9012 Acquired absence of left breast and nipple: Secondary | ICD-10-CM | POA: Diagnosis not present

## 2016-03-01 DIAGNOSIS — C50812 Malignant neoplasm of overlapping sites of left female breast: Secondary | ICD-10-CM | POA: Insufficient documentation

## 2016-03-01 DIAGNOSIS — J320 Chronic maxillary sinusitis: Secondary | ICD-10-CM | POA: Diagnosis not present

## 2016-03-01 DIAGNOSIS — R300 Dysuria: Secondary | ICD-10-CM | POA: Diagnosis not present

## 2016-03-01 DIAGNOSIS — Z170 Estrogen receptor positive status [ER+]: Secondary | ICD-10-CM | POA: Diagnosis not present

## 2016-03-01 DIAGNOSIS — C50912 Malignant neoplasm of unspecified site of left female breast: Secondary | ICD-10-CM | POA: Diagnosis not present

## 2016-03-01 LAB — GLUCOSE, CAPILLARY: Glucose-Capillary: 101 mg/dL — ABNORMAL HIGH (ref 65–99)

## 2016-03-01 MED ORDER — FLUDEOXYGLUCOSE F - 18 (FDG) INJECTION
13.3800 | Freq: Once | INTRAVENOUS | Status: AC | PRN
Start: 2016-03-01 — End: 2016-03-01
  Administered 2016-03-01: 13.38 via INTRAVENOUS

## 2016-03-02 NOTE — Telephone Encounter (Signed)
Chart reviewed.

## 2016-03-07 ENCOUNTER — Ambulatory Visit (HOSPITAL_BASED_OUTPATIENT_CLINIC_OR_DEPARTMENT_OTHER): Payer: Medicare Other

## 2016-03-07 ENCOUNTER — Ambulatory Visit (HOSPITAL_BASED_OUTPATIENT_CLINIC_OR_DEPARTMENT_OTHER): Payer: Medicare Other | Admitting: Oncology

## 2016-03-07 ENCOUNTER — Other Ambulatory Visit (HOSPITAL_BASED_OUTPATIENT_CLINIC_OR_DEPARTMENT_OTHER): Payer: Medicare Other

## 2016-03-07 VITALS — BP 150/62 | HR 59 | Temp 97.7°F | Resp 18 | Ht 67.0 in | Wt 270.7 lb

## 2016-03-07 DIAGNOSIS — C7951 Secondary malignant neoplasm of bone: Secondary | ICD-10-CM

## 2016-03-07 DIAGNOSIS — G4700 Insomnia, unspecified: Secondary | ICD-10-CM | POA: Diagnosis not present

## 2016-03-07 DIAGNOSIS — M545 Low back pain: Secondary | ICD-10-CM | POA: Diagnosis not present

## 2016-03-07 DIAGNOSIS — N393 Stress incontinence (female) (male): Secondary | ICD-10-CM | POA: Diagnosis not present

## 2016-03-07 DIAGNOSIS — Z170 Estrogen receptor positive status [ER+]: Secondary | ICD-10-CM | POA: Diagnosis not present

## 2016-03-07 DIAGNOSIS — C50912 Malignant neoplasm of unspecified site of left female breast: Secondary | ICD-10-CM

## 2016-03-07 DIAGNOSIS — C50812 Malignant neoplasm of overlapping sites of left female breast: Secondary | ICD-10-CM | POA: Diagnosis not present

## 2016-03-07 DIAGNOSIS — I890 Lymphedema, not elsewhere classified: Secondary | ICD-10-CM

## 2016-03-07 DIAGNOSIS — M2550 Pain in unspecified joint: Secondary | ICD-10-CM | POA: Diagnosis not present

## 2016-03-07 LAB — CBC WITH DIFFERENTIAL/PLATELET
BASO%: 0.9 % (ref 0.0–2.0)
Basophils Absolute: 0 10*3/uL (ref 0.0–0.1)
EOS%: 8.1 % — AB (ref 0.0–7.0)
Eosinophils Absolute: 0.4 10*3/uL (ref 0.0–0.5)
HEMATOCRIT: 38.7 % (ref 34.8–46.6)
HGB: 12.5 g/dL (ref 11.6–15.9)
LYMPH#: 1.1 10*3/uL (ref 0.9–3.3)
LYMPH%: 22.6 % (ref 14.0–49.7)
MCH: 28.3 pg (ref 25.1–34.0)
MCHC: 32.3 g/dL (ref 31.5–36.0)
MCV: 87.5 fL (ref 79.5–101.0)
MONO#: 0.4 10*3/uL (ref 0.1–0.9)
MONO%: 7.2 % (ref 0.0–14.0)
NEUT#: 3.1 10*3/uL (ref 1.5–6.5)
NEUT%: 61.2 % (ref 38.4–76.8)
Platelets: 255 10*3/uL (ref 145–400)
RBC: 4.43 10*6/uL (ref 3.70–5.45)
RDW: 16 % — ABNORMAL HIGH (ref 11.2–14.5)
WBC: 5.1 10*3/uL (ref 3.9–10.3)

## 2016-03-07 LAB — COMPREHENSIVE METABOLIC PANEL
ALBUMIN: 3.6 g/dL (ref 3.5–5.0)
ALK PHOS: 105 U/L (ref 40–150)
ALT: 13 U/L (ref 0–55)
ANION GAP: 9 meq/L (ref 3–11)
AST: 17 U/L (ref 5–34)
BILIRUBIN TOTAL: 1 mg/dL (ref 0.20–1.20)
BUN: 9.7 mg/dL (ref 7.0–26.0)
CO2: 25 mEq/L (ref 22–29)
CREATININE: 0.8 mg/dL (ref 0.6–1.1)
Calcium: 9.2 mg/dL (ref 8.4–10.4)
Chloride: 108 mEq/L (ref 98–109)
GLUCOSE: 113 mg/dL (ref 70–140)
Potassium: 4.4 mEq/L (ref 3.5–5.1)
SODIUM: 141 meq/L (ref 136–145)
TOTAL PROTEIN: 7.5 g/dL (ref 6.4–8.3)

## 2016-03-07 MED ORDER — LETROZOLE 2.5 MG PO TABS: 2.5000 mg | ORAL_TABLET | Freq: Every day | ORAL | 4 refills | Status: DC

## 2016-03-07 MED ORDER — DENOSUMAB 120 MG/1.7ML ~~LOC~~ SOLN
120.0000 mg | Freq: Once | SUBCUTANEOUS | Status: AC
  Administered 2016-03-07: 120 mg via SUBCUTANEOUS
  Filled 2016-03-07: qty 1.7

## 2016-03-07 MED ORDER — GABAPENTIN 100 MG PO CAPS: 100.0000 mg | ORAL_CAPSULE | Freq: Two times a day (BID) | ORAL | 0 refills | Status: DC

## 2016-03-07 MED ORDER — PALBOCICLIB 100 MG PO CAPS
ORAL_CAPSULE | ORAL | 0 refills | Status: DC
Start: 2016-03-07 — End: 2016-03-15

## 2016-03-07 NOTE — Patient Instructions (Signed)
Denosumab injection  What is this medicine?  DENOSUMAB (den oh sue mab) slows bone breakdown. Prolia is used to treat osteoporosis in women after menopause and in men. Xgeva is used to prevent bone fractures and other bone problems caused by cancer bone metastases. Xgeva is also used to treat giant cell tumor of the bone.  This medicine may be used for other purposes; ask your health care provider or pharmacist if you have questions.  What should I tell my health care provider before I take this medicine?  They need to know if you have any of these conditions:  -dental disease  -eczema  -infection or history of infections  -kidney disease or on dialysis  -low blood calcium or vitamin D  -malabsorption syndrome  -scheduled to have surgery or tooth extraction  -taking medicine that contains denosumab  -thyroid or parathyroid disease  -an unusual reaction to denosumab, other medicines, foods, dyes, or preservatives  -pregnant or trying to get pregnant  -breast-feeding  How should I use this medicine?  This medicine is for injection under the skin. It is given by a health care professional in a hospital or clinic setting.  If you are getting Prolia, a special MedGuide will be given to you by the pharmacist with each prescription and refill. Be sure to read this information carefully each time.  For Prolia, talk to your pediatrician regarding the use of this medicine in children. Special care may be needed. For Xgeva, talk to your pediatrician regarding the use of this medicine in children. While this drug may be prescribed for children as young as 13 years for selected conditions, precautions do apply.  Overdosage: If you think you have taken too much of this medicine contact a poison control center or emergency room at once.  NOTE: This medicine is only for you. Do not share this medicine with others.  What if I miss a dose?  It is important not to miss your dose. Call your doctor or health care professional if you are  unable to keep an appointment.  What may interact with this medicine?  Do not take this medicine with any of the following medications:  -other medicines containing denosumab  This medicine may also interact with the following medications:  -medicines that suppress the immune system  -medicines that treat cancer  -steroid medicines like prednisone or cortisone  This list may not describe all possible interactions. Give your health care provider a list of all the medicines, herbs, non-prescription drugs, or dietary supplements you use. Also tell them if you smoke, drink alcohol, or use illegal drugs. Some items may interact with your medicine.  What should I watch for while using this medicine?  Visit your doctor or health care professional for regular checks on your progress. Your doctor or health care professional may order blood tests and other tests to see how you are doing.  Call your doctor or health care professional if you get a cold or other infection while receiving this medicine. Do not treat yourself. This medicine may decrease your body's ability to fight infection.  You should make sure you get enough calcium and vitamin D while you are taking this medicine, unless your doctor tells you not to. Discuss the foods you eat and the vitamins you take with your health care professional.  See your dentist regularly. Brush and floss your teeth as directed. Before you have any dental work done, tell your dentist you are receiving this medicine.  Do   not become pregnant while taking this medicine or for 5 months after stopping it. Women should inform their doctor if they wish to become pregnant or think they might be pregnant. There is a potential for serious side effects to an unborn child. Talk to your health care professional or pharmacist for more information.  What side effects may I notice from receiving this medicine?  Side effects that you should report to your doctor or health care professional as soon as  possible:  -allergic reactions like skin rash, itching or hives, swelling of the face, lips, or tongue  -breathing problems  -chest pain  -fast, irregular heartbeat  -feeling faint or lightheaded, falls  -fever, chills, or any other sign of infection  -muscle spasms, tightening, or twitches  -numbness or tingling  -skin blisters or bumps, or is dry, peels, or red  -slow healing or unexplained pain in the mouth or jaw  -unusual bleeding or bruising  Side effects that usually do not require medical attention (Report these to your doctor or health care professional if they continue or are bothersome.):  -muscle pain  -stomach upset, gas  This list may not describe all possible side effects. Call your doctor for medical advice about side effects. You may report side effects to FDA at 1-800-FDA-1088.  Where should I keep my medicine?  This medicine is only given in a clinic, doctor's office, or other health care setting and will not be stored at home.  NOTE: This sheet is a summary. It may not cover all possible information. If you have questions about this medicine, talk to your doctor, pharmacist, or health care provider.      2016, [employer]vier/Gold Standard. (2011-09-10 12:37:47)

## 2016-03-07 NOTE — Progress Notes (Signed)
Okay ID: Michaela Little   DOB: 29-Oct-1960  MR#: [id number]  GDJ#:[id number]  PCP: Gildardo Cranker, DO SU:  Ralene Ok GYN: Harrell Gave, MD OTHER MD: Crissie Reese, Silvano Rusk, Christinia Gully; Drs Tye Savoy and Robbie Louis ([place])  CHIEF COMPLAINT: estrogen receptor positive stage IV breast cancer  CURRENT TREATMENT:  Letrozole, palbociclib, denosumab/Xgeva  BREAST CANCER HISTORY: From the original intake note:  Michaela Little is a [place] woman who moved to [place] in 2013 with a history of breast cancer, and establishied herself with our practice.  The patient had left breast and left axillary lymph node biopsy performed March of 2010, both positive (I do not have the pathology report at this point). She was treated neo-adjuvantly at [facility] with (a) paclitaxel weekly x12 and (b) doxorubicin/cyclophosphamide in dose dense fashion x4, both given with tifiparnib.   She then proceeded to left modified radical mastectomy 12/23/2008, the final pathology showing a 4 cm residual invasive lobular carcinoma, involving 5/22 lymph nodes sampled. The tumor was estrogen and progesterone receptor positive, HER-2 negative.   Postoperatively she received 50 gray of radiation completed January of 2011, including the left supraclavicular lymph node basin. She was then started on tamoxifen, but developed some uterine lining thickening and was switched to anastrozole in May 2012. Her subsequent history is as detailed below  INTERVAL HISTORY: Michaela Little returns today for follow-up of her metastatic breast cancer. Interval history is generally stable. She is on the combination of letrozole and palbociclib. She tolerates these 2 drugs with no side effects that she is aware of. She obtains both of them at no cost.  She is also on denosumab/Xgeva every 28 days. She has had no side effects from that either.  We just restage her with a PET scan which shows no evidence of active disease, more  specifically no visceral disease and no evidence of disease in the bones were we have pathologic proof of prior metastatic involvement.  REVIEW OF SYSTEMS: Michaela Little tells me she has finally been approved to move into [facility] which is a [employer] complex. She is very pleased with that prospect because her current neighborhood she says is not safe. She has problems with insomnia. She gets leg cramps at times. She is short of breath when walking. She has a dry cough. She has stress urinary incontinence. She has back and joint pain which is not more intense or persistent than before. She has occasional headaches. A detailed review of systems was otherwise stable  PAST MEDICAL HISTORY: Past Medical History:  Diagnosis Date  . Anxiety   . Arthritis    hands, knees, hips  . Asthma   . Cancer ([place])   . Cervical stenosis (uterine cervix)   . Dyspnea on exertion   . GERD (gastroesophageal reflux disease)   . Headache(784.0)   . History of breast cancer    2010--  LEFT  s/p  mastectomy (in [place]) AND CHEMORADIATION--  NO RECURRENCE  . History of cervical dysplasia   . History of TB skin testing    [place]--  [place]/ MEDS  . Hyperlipidemia   . Hypertension   . OSA (obstructive sleep apnea) moderate osa per study  09/2010   CPAP  , NOT USING ON REGULAR BASIS  . Pelvic pain in female   . Positive H. pylori test    08-05-2013  . Refusal of blood transfusions as patient is Jehovah's Witness   . Seasonal allergies   . Uterine fibroid   .  Wears glasses     PAST SURGICAL HISTORY: Past Surgical History:  Procedure Laterality Date  . CARDIOVASCULAR STRESS TEST  10-29-2012   low risk perfusion study/  no significant reversibity/ ef 66%/  normal wall motion  . CERVICAL CONIZATION W/BX  2012   in  [place] N/A 05/14/2012   Procedure: LAPAROSCOPIC CHOLECYSTECTOMY;  Surgeon: Ralene Ok, MD;  Location: [facility];  Service: General;  Laterality: N/A;  . COLONOSCOPY  2011    normal per patient - [place]  . DILATION AND CURETTAGE OF UTERUS  10/15/2011   Procedure: DILATATION AND CURETTAGE;  Surgeon: Melina Schools, MD;  Location: [facility];  Service: Gynecology;  Laterality: N/A;  Conization &  endocervical curettings  . EXAMINATION UNDER ANESTHESIA N/A 08/20/2013   Procedure: EXAM UNDER ANESTHESIA;  Surgeon: Margarette Asal, MD;  Location: [facility];  Service: Gynecology;  Laterality: N/A;  . [place]   right  . LAPAROSCOPIC ASSISTED VAGINAL HYSTERECTOMY N/A 10/26/2014   Procedure: HYSTERECTOMY ABDOMINAL ;  Surgeon: Molli Posey, MD;  Location: [facility];  Service: Gynecology;  Laterality: N/A;  . MASTECTOMY Left 11/2008  in [place]   . SALPINGOOPHORECTOMY Bilateral 10/26/2014   Procedure: BILATERAL SALPINGO OOPHORECTOMY;  Surgeon: Molli Posey, MD;  Location: [facility];  Service: Gynecology;  Laterality: Bilateral;  . TRANSTHORACIC ECHOCARDIOGRAM  10-29-2012   mild lvh/  ef 60-65%/  grade II diastolic dysfunction/  trivial mr  &  tr    FAMILY HISTORY Family History  Problem Relation Age of Onset  . Breast cancer Mother   . Colon cancer Mother   . Hypotension Mother   . Asthma Mother   . Diabetes type II Mother   . Arthritis Mother   . Clotting disorder Mother   . Cancer Mother     breast/colon  . Mental illness Brother   . Heart disease Brother   . Cerebral palsy Daughter   . Emphysema Brother     never smoker  . Colon cancer Maternal Aunt   . Cancer Maternal Aunt     colon  . Colon cancer Maternal Uncle   . Cancer Maternal Uncle     colon  . Esophageal cancer Neg Hx   . Rectal cancer Neg Hx   . Stomach cancer Neg Hx   . Thyroid disease Neg Hx    Gynecologic history: She is GX P5. At first pregnancy to term age 79. Menarche age 83. Last menstrual period when she had her chemotherapy.   Social History: She is disabled secondary to arthritis. She moved from [place] to [place] for "peace of mind". Of her 5  children only one daughter is living in [place], [place]. One daughter in [place] is incarcerated and another one is "in a lot of stress". The patient lives alone. She mostly reads and does computer games on her phone during the day. She has 20 grandchildren.    ADVANCED DIRECTIVES: She does not have a healthcare power of attorney.    HEALTH MAINTENANCE: Social History  Substance Use Topics  . Smoking status: Former Smoker    Packs/day: 0.30    Years: 10.00    Types: Cigarettes    Quit date: 03/26/1988  . Smokeless tobacco: Never Used  . Alcohol use No     Allergies  Allergen Reactions  . Lisinopril-Hydrochlorothiazide Itching  . Adhesive [Tape] Itching and Other (See Comments)    Redness  .  Calcium Hives    Fruit flavored Tums (cannot take)   . Calcium Carbonate Antacid Hives    Fruit flavored Tums   . Hctz [Hydrochlorothiazide] Itching  . Latex Itching  . Lisinopril Itching  . Losartan Potassium Other (See Comments)    Makes her feel "bad"     Current Outpatient Prescriptions  Medication Sig Dispense Refill  . diphenhydrAMINE (BENADRYL) 25 mg capsule Take 25 mg by mouth every 6 (six) hours as needed for allergies.    Marland Kitchen gabapentin (NEURONTIN) 100 MG capsule Take 1 capsule (100 mg total) by mouth 2 (two) times daily. 20 capsule 0  . letrozole (FEMARA) 2.5 MG tablet Take 1 tablet (2.5 mg total) by mouth daily. 90 tablet 4  . metoprolol tartrate (LOPRESSOR) 25 MG tablet Take 1 tablet (25 mg total) by mouth 2 (two) times daily. 180 tablet 1  . palbociclib (IBRANCE) 100 MG capsule TAKE 1 CAPSULE BY MOUTH DAILY FOR 21 DAYS, THEN 7 DAYS REST. TAKE WHOLE WITH FOOD. 21 capsule 0  . promethazine (PHENERGAN) 25 MG tablet Take 1 tablet (25 mg total) by mouth every 6 (six) hours as needed for nausea or vomiting. 120 tablet 0   No current facility-administered medications for this visit.    Facility-Administered Medications Ordered in Other Visits  Medication Dose Route  Frequency Provider Last Rate Last Dose  . denosumab (XGEVA) injection 120 mg  120 mg Subcutaneous Once Chauncey Cruel, MD        OBJECTIVE: Middle-aged [place] American woman  Who appears stated age 55:   03/07/16 1114  BP: (!) 150/62  Pulse: (!) 59  Resp: 18  Temp: 97.7 F (36.5 C)     Body mass index is 42.4 kg/m.    ECOG FS: 1 Filed Weights   03/07/16 1114  Weight: 270 lb 11.2 oz (122.8 kg)   Sclerae unicteric, pupils round and equal Oropharynx clear and moist-- no thrush or other lesions No cervical or supraclavicular adenopathy Lungs no rales or rhonchi Heart regular rate and rhythm Abd soft, Obese, nontender, positive bowel sounds MSK no focal spinal tenderness Neuro: nonfocal, well oriented, appropriate affect Breasts: The right breast is benign. The left breast is status post mastectomy with expander in place. There is no evidence of local recurrence. The left axilla is benign.   LAB RESULTS: Lab Results  Component Value Date   WBC 5.1 03/07/2016   NEUTROABS 3.1 03/07/2016   HGB 12.5 03/07/2016   HCT 38.7 03/07/2016   MCV 87.5 03/07/2016   PLT 255 03/07/2016      Chemistry      Component Value Date/Time   NA 141 03/07/2016 1051   K 4.4 03/07/2016 1051   CL 105 02/02/2016 1517   CL 105 07/07/2012 1327   CO2 25 03/07/2016 1051   BUN 9.7 03/07/2016 1051   CREATININE 0.8 03/07/2016 1051      Component Value Date/Time   CALCIUM 9.2 03/07/2016 1051   ALKPHOS 105 03/07/2016 1051   AST 17 03/07/2016 1051   ALT 13 03/07/2016 1051   BILITOT 1.00 03/07/2016 1051       Lab Results  Component Value Date   LABCA2 19 01/17/2012     STUDIES: Nm Pet Image Restag (ps) Skull Base To Thigh  Result Date: 03/01/2016 CLINICAL DATA:  Subsequent treatment strategy for left breast cancer. EXAM: NUCLEAR MEDICINE PET SKULL BASE TO THIGH TECHNIQUE: 13.4 mCi F-18 FDG was injected intravenously. Full-ring PET imaging was performed from the skull  base to thigh  after the radiotracer. CT data was obtained and used for attenuation correction and anatomic localization. FASTING BLOOD GLUCOSE:  Value: 101 mg/dl COMPARISON:  Multiple exams, including 11/07/2015 FINDINGS: NECK No hypermetabolic lymph nodes in the neck. Mild chronic right ethmoid sinusitis. Mild chronic left maxillary sinusitis. CHEST No hypermetabolic mediastinal or hilar nodes. 2 mm subpleural nodule in the left upper lobe, image 20/8, no change from 10/02/2012, considered benign. Left mastectomy and prior axillary dissection, with a left breast implant in place. ABDOMEN/PELVIS No abnormal hypermetabolic activity within the liver, pancreas, adrenal glands, or spleen. No hypermetabolic lymph nodes in the abdomen or pelvis. Physiologic activity in the bowel. Cholecystectomy. 1.5 cm right kidney lower pole hypodense lesion, photopenic, likely a cyst. SKELETON No focal hypermetabolic activity to suggest skeletal metastasis. No appreciable rib or thoracic spine activity. IMPRESSION: 1. No current findings of active malignancy. 2. Prior left mastectomy and left axillary dissection. 3. 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 Electronically Signed   By: Van Clines M.D.   On: 03/01/2016 12:30     IMPRESSION: 55 y.o.  [place] woman relocated here from [place],    (1) breast cancer of left breast overlapping sites diagnosed March of 2010 and treated neo-adjuvantly at [facility] with (a) paclitaxel weekly x12 and (b) doxorubicin/ cyclophosphamide in dose dense fashion x4, both given with tifiparnib.   (2) definitive left modified radical mastectomy September of 2010 for a T2 N2 or stage IIIA invasive lobular breast cancer, estrogen and progesterone receptor positive, HER-2 negative,   (3)  post mastectomy radiation completed January of 2011,  (4) briefly on tamoxifen, discontinued it because of uterine lining concerns.  On anastrozole since May 2012 with good tolerance. Held between  November and December 2013 due to GI issues, unrelated to cancer or its treatment.  Discontinued by the patient summer of 2016  (5)  Other problems include left upper extremity lymphedema, borderline concern regarding genetic family history, history of cervical dysplasia, and history of dense breasts.  METASTATIC DISEASE: (6) iliac bone biopsy (laterality?) 05/26/2015 showed metastatic carcinoma consistent with a breast primary, estrogen receptor positive, progesterone receptor and HER-2 negative  (a) extensive staging studies including CT scans of the chest abdomen and pelvis, MRI of the brain, and a PET scan February-March 2017 in [place] showed no evidence of visceral disease   (7) palliative radiation to the right 8th rib lesion (single 8 gray dose) and right hip (20 gray in 5 fractions) given 06/24/2015 through 06/27/2015 at [facility]  (8) letrozole and palbociclib started March 2017  (a) palbociclib dose decreased to 100 mg daily (21/7) because of cytopenias  (b) repeat chest CT and bone scan 11/07/2015 shows stable bone only metastatic disease  (c) scan 03/01/2016 shows no visceral disease and no bony metastatic activity  (9) enoxaparin started February 2017 in [facility]  for reading of "central pulmonary embolus" on CT scan of the chest 04/19/2015  (a) CT report amended 10/18/2015 show no pulmonary embolus; enoxaparin discontinued  (10) denosumab/Xgeva started 11/16/2015, repeated monthly  PLAN: Michaela Little will soon be a year out from definitive diagnosis of metastatic breast cancer, and her PET scan shows no evidence of active disease. This is very favorable.  She is tolerating the palbociclib and letrozole with no side effects that she is aware of. She is obtaining them at a very good price. The plan is to continue those medications and definitely until there is evidence of disease progression  She also receives  denosumab/Xgeva every 28 days. We will continue that through this  year. If in December 2018 we continue with no evidence of disease activity I will probably decrease her Xgeva to every 2 months at that point  Every time she receives the [facility] shot she has lab work and we used that to instruct her on how to proceed with her palbociclib/Ibrance. At present we are continuing on 100 mg daily  She will have mammography again in April. She will see me again in May. We will do a chest x-ray that day. She knows to call for any problems that may develop before that visit        Emelda Kohlbeck C 03/07/2016

## 2016-03-09 NOTE — Telephone Encounter (Signed)
NO ENTRY 

## 2016-03-15 ENCOUNTER — Other Ambulatory Visit: Payer: Self-pay | Admitting: *Deleted

## 2016-03-15 MED ORDER — PALBOCICLIB 100 MG PO CAPS: ORAL_CAPSULE | ORAL | 0 refills | Status: DC

## 2016-03-21 ENCOUNTER — Telehealth: Payer: Self-pay | Admitting: *Deleted

## 2016-03-21 NOTE — Telephone Encounter (Signed)
Voicemail: "I have not heard from anyone about the lab results and the Ibrance.  Please call and tell me what to do."  Called patient informing her she should be taking Ibrance per last visit on 03-07-2016 and labs drawn on this day.  She asked about kidney function and liver function all results WNL.    Advised to proceed with Ibrance as ordered on 03-15-2016.  "I do not have any and told pharmacy the medicine is on hold.  I also have moved.  New address  Is on [address]  (entered at this time).  I'm having a lot of pain in rib cage and pelvic area with difficulty walking with this move."    Advised she let the people helping her move do more on her behalf due to this pain.  Monitor and call if any changes.

## 2016-03-22 ENCOUNTER — Other Ambulatory Visit: Payer: Self-pay | Admitting: *Deleted

## 2016-03-22 ENCOUNTER — Telehealth: Payer: Self-pay | Admitting: *Deleted

## 2016-03-22 NOTE — Telephone Encounter (Signed)
This RN contacted [employer]- per pt stating she has not received her prescription yet.   Informed prescription received 03/15/2016 - ready for shipment - will contact pt for delivery.

## 2016-03-23 ENCOUNTER — Other Ambulatory Visit: Payer: Self-pay | Admitting: Oncology

## 2016-03-27 DIAGNOSIS — C50919 Malignant neoplasm of unspecified site of unspecified female breast: Secondary | ICD-10-CM | POA: Diagnosis not present

## 2016-03-29 ENCOUNTER — Emergency Department (HOSPITAL_COMMUNITY): Payer: Medicare Other

## 2016-03-29 ENCOUNTER — Encounter (HOSPITAL_COMMUNITY): Payer: Self-pay | Admitting: Emergency Medicine

## 2016-03-29 ENCOUNTER — Emergency Department (HOSPITAL_COMMUNITY)
Admission: EM | Admit: 2016-03-29 | Discharge: 2016-03-29 | Disposition: A | Discharge status: Home / Self Care | Payer: Medicare Other | Attending: Emergency Medicine | Admitting: Emergency Medicine

## 2016-03-29 DIAGNOSIS — Y939 Activity, unspecified: Secondary | ICD-10-CM | POA: Insufficient documentation

## 2016-03-29 DIAGNOSIS — Z853 Personal history of malignant neoplasm of breast: Secondary | ICD-10-CM | POA: Diagnosis not present

## 2016-03-29 DIAGNOSIS — Y929 Unspecified place or not applicable: Secondary | ICD-10-CM | POA: Diagnosis not present

## 2016-03-29 DIAGNOSIS — J45909 Unspecified asthma, uncomplicated: Secondary | ICD-10-CM | POA: Diagnosis not present

## 2016-03-29 DIAGNOSIS — Y999 Unspecified external cause status: Secondary | ICD-10-CM | POA: Diagnosis not present

## 2016-03-29 DIAGNOSIS — I10 Essential (primary) hypertension: Secondary | ICD-10-CM | POA: Diagnosis not present

## 2016-03-29 DIAGNOSIS — Z87891 Personal history of nicotine dependence: Secondary | ICD-10-CM | POA: Diagnosis not present

## 2016-03-29 DIAGNOSIS — M79652 Pain in left thigh: Secondary | ICD-10-CM | POA: Insufficient documentation

## 2016-03-29 DIAGNOSIS — W1830XA Fall on same level, unspecified, initial encounter: Secondary | ICD-10-CM | POA: Insufficient documentation

## 2016-03-29 DIAGNOSIS — S838X2A Sprain of other specified parts of left knee, initial encounter: Secondary | ICD-10-CM | POA: Diagnosis not present

## 2016-03-29 DIAGNOSIS — M25562 Pain in left knee: Secondary | ICD-10-CM | POA: Diagnosis not present

## 2016-03-29 DIAGNOSIS — Z85830 Personal history of malignant neoplasm of bone: Secondary | ICD-10-CM | POA: Insufficient documentation

## 2016-03-29 DIAGNOSIS — M25552 Pain in left hip: Secondary | ICD-10-CM | POA: Diagnosis not present

## 2016-03-29 DIAGNOSIS — S8992XA Unspecified injury of left lower leg, initial encounter: Secondary | ICD-10-CM | POA: Diagnosis not present

## 2016-03-29 MED ORDER — ACETAMINOPHEN 500 MG PO TABS
1000.0000 mg | ORAL_TABLET | Freq: Once | ORAL | Status: AC
  Administered 2016-03-29: 1000 mg via ORAL
  Filled 2016-03-29: qty 2

## 2016-03-29 MED ORDER — OXYCODONE-ACETAMINOPHEN 5-325 MG PO TABS
1.0000 | ORAL_TABLET | ORAL | Status: DC | PRN
Start: 2016-03-29 — End: 2016-03-29

## 2016-03-29 MED ORDER — HYDROCODONE-ACETAMINOPHEN 5-325 MG PO TABS: 1.0000 | ORAL_TABLET | Freq: Once | ORAL | Status: DC

## 2016-03-29 NOTE — ED Provider Notes (Signed)
[facility] DEPT Provider Note   CSN: [id number] Arrival date & time: 03/29/16  1415   By signing my name below, I, Neta Mends, attest that this documentation has been prepared under the direction and in the presence of Sharlett Iles, MD . Electronically Signed: Neta Mends, ED Scribe. 03/29/2016. 5:01 PM.   History   Chief Complaint Chief Complaint  Patient presents with  . Fall    The history is provided by the patient. No language interpreter was used.   HPI Comments:  Michaela Little is a 56 y.o. female with PMHx of HTN and breast cancer who presents to the Emergency Department complaining of left knee pain after a fall that occurred yesterday. Pt reports that she heard her left knee crack and then fell backwards. Pt also notes pain to her left thigh. Pt states that the pain is exacerbated when walking. Pt is ambulatory but with pain. Pt is a breast cancer patient. Pt denies head injury and LOC, and denies any previous leg surgery. Pt denies numbness.   Past Medical History:  Diagnosis Date  . Anxiety   . Arthritis    hands, knees, hips  . Asthma   . Cancer ([place])   . Cervical stenosis (uterine cervix)   . Dyspnea on exertion   . GERD (gastroesophageal reflux disease)   . Headache(784.0)   . History of breast cancer    2010--  LEFT  s/p  mastectomy (in [place]) AND CHEMORADIATION--  NO RECURRENCE  . History of cervical dysplasia   . History of TB skin testing    AS TEEN--  [place]/ MEDS  . Hyperlipidemia   . Hypertension   . OSA (obstructive sleep apnea) moderate osa per study  09/2010   CPAP  , NOT USING ON REGULAR BASIS  . Pelvic pain in female   . Positive H. pylori test    08-05-2013  . Refusal of blood transfusions as patient is Jehovah's Witness   . Seasonal allergies   . Uterine fibroid   . Wears glasses     Patient Active Problem List   Diagnosis Date Noted  . Multinodular thyroid 02/08/2016  . Lumbar back pain with radiculopathy  affecting left lower extremity 02/08/2016  . Carcinoma of breast metastatic to bone ([place]) 02/08/2016  . Bone metastases ([place]) 11/13/2015  . FH: TAH-BSO (total abdominal hysterectomy and bilateral salpingo-oophorectomy) 10/27/2014  . Right thyroid nodule 06/30/2014  . Allergic rhinitis due to pollen 06/30/2014  . Essential hypertension 12/24/2013  . Left ventricular diastolic dysfunction with preserved systolic function [id number]  . Lump in neck 08/11/2013  . Abdominal pain, chronic, bilateral lower quadrant 08/05/2013  . Abdominal pain, epigastric 08/05/2013  . Severe obesity (BMI >= 40) ([place]) 06/23/2013  . Allergic rhinitis 06/23/2013  . GERD (gastroesophageal reflux disease) 03/25/2013  . Constipation 03/25/2013  . Other and unspecified hyperlipidemia 03/25/2013  . Dysuria 03/25/2013  . Routine general medical examination at a health care facility 03/25/2013  . Cancer of overlapping sites of left female breast ([place]) 03/05/2013  . Cough 10/28/2012  . Dyspnea 10/28/2012  . Chronic abdominal pain 09/02/2012  . Pelvic pain in female 02/06/2012  . Atypical chest pain 11/13/2011  . Hyperlipidemia 11/13/2011  . Dysplasia of cervix, low grade (CIN 1) 08/22/2011  . Diastolic dysfunction [id number]  . Influenza 03/04/2011  . Hypertension 03/04/2011  . Obesity 03/04/2011  . OSA (obstructive sleep apnea) 02/27/2011  . Asthma 02/08/2011    Past Surgical History:  Procedure  Laterality Date  . CARDIOVASCULAR STRESS TEST  10-29-2012   low risk perfusion study/  no significant reversibity/ ef 66%/  normal wall motion  . CERVICAL CONIZATION W/BX  2012   in  [place] N/A 05/14/2012   Procedure: LAPAROSCOPIC CHOLECYSTECTOMY;  Surgeon: Ralene Ok, MD;  Location: [facility];  Service: General;  Laterality: N/A;  . COLONOSCOPY  2011   normal per patient - [place]  . DILATION AND CURETTAGE OF UTERUS  10/15/2011   Procedure: DILATATION AND CURETTAGE;  Surgeon: Melina Schools, MD;   Location: [facility];  Service: Gynecology;  Laterality: N/A;  Conization &  endocervical curettings  . EXAMINATION UNDER ANESTHESIA N/A 08/20/2013   Procedure: EXAM UNDER ANESTHESIA;  Surgeon: Margarette Asal, MD;  Location: [facility];  Service: Gynecology;  Laterality: N/A;  . [place]   right  . LAPAROSCOPIC ASSISTED VAGINAL HYSTERECTOMY N/A 10/26/2014   Procedure: HYSTERECTOMY ABDOMINAL ;  Surgeon: Molli Posey, MD;  Location: [facility];  Service: Gynecology;  Laterality: N/A;  . MASTECTOMY Left 11/2008  in [place]   . SALPINGOOPHORECTOMY Bilateral 10/26/2014   Procedure: BILATERAL SALPINGO OOPHORECTOMY;  Surgeon: Molli Posey, MD;  Location: [facility];  Service: Gynecology;  Laterality: Bilateral;  . TRANSTHORACIC ECHOCARDIOGRAM  10-29-2012   mild lvh/  ef 60-65%/  grade II diastolic dysfunction/  trivial mr  &  tr    OB History    Gravida Para Term Preterm AB Living   10 5 5   5 5    SAB TAB Ectopic Multiple Live Births   3 2             Home Medications    Prior to Admission medications   Medication Sig Start Date End Date Taking? Authorizing Provider  diphenhydrAMINE (BENADRYL) 25 mg capsule Take 25 mg by mouth every 6 (six) hours as needed for allergies.    Historical Provider, MD  gabapentin (NEURONTIN) 100 MG capsule Take 1 capsule (100 mg total) by mouth 2 (two) times daily. 03/07/16 03/17/16  Chauncey Cruel, MD  letrozole [facility]) 2.5 MG tablet Take 1 tablet (2.5 mg total) by mouth daily. 03/07/16   Chauncey Cruel, MD  metoprolol tartrate (LOPRESSOR) 25 MG tablet Take 1 tablet (25 mg total) by mouth 2 (two) times daily. 01/12/16   Monica Carter, DO  palbociclib (IBRANCE) 100 MG capsule TAKE 1 CAPSULE BY MOUTH DAILY FOR 21 DAYS, THEN 7 DAYS REST. TAKE WHOLE WITH FOOD. 03/15/16   Chauncey Cruel, MD  promethazine (PHENERGAN) 25 MG tablet Take 1 tablet (25 mg total) by mouth every 6 (six) hours as needed for nausea or vomiting.  01/17/16   Gildardo Cranker, DO    Family History Family History  Problem Relation Age of Onset  . Breast cancer Mother   . Colon cancer Mother   . Hypotension Mother   . Asthma Mother   . Diabetes type II Mother   . Arthritis Mother   . Clotting disorder Mother   . Cancer Mother     breast/colon  . Mental illness Brother   . Heart disease Brother   . Cerebral palsy Daughter   . Emphysema Brother     never smoker  . Colon cancer Maternal Aunt   . Cancer Maternal Aunt     colon  . Colon cancer Maternal Uncle   . Cancer Maternal Uncle     colon  . Esophageal cancer  Neg Hx   . Rectal cancer Neg Hx   . Stomach cancer Neg Hx   . Thyroid disease Neg Hx     Social History Social History  Substance Use Topics  . Smoking status: Former Smoker    Packs/day: 0.30    Years: 10.00    Types: Cigarettes    Quit date: 03/26/1988  . Smokeless tobacco: Never Used  . Alcohol use No     Allergies   Lisinopril-hydrochlorothiazide; Adhesive [tape]; Calcium; Calcium carbonate antacid; Hctz [hydrochlorothiazide]; Latex; Lisinopril; and Losartan potassium   Review of Systems Review of Systems 10 Systems reviewed and are negative for acute change except as noted in the HPI.   Physical Exam Updated Vital Signs BP 158/77 (BP Location: Right Arm)   Pulse 65   Temp 98.7 F (37.1 C) (Oral)   Resp 18   LMP 02/25/2009   SpO2 100%   Physical Exam  Constitutional: She is oriented to person, place, and time. She appears well-developed and well-nourished. No distress.  HENT:  Head: Normocephalic and atraumatic.  Eyes: Conjunctivae are normal.  Neck: Neck supple.  Cardiovascular: Intact distal pulses.   Musculoskeletal:  No obvious swelling or deformity of left knee or thigh, but TTP of distal left knee and posterior mid left thigh.   Neurological: She is alert and oriented to person, place, and time. No sensory deficit.  Skin: Skin is warm and dry.  Psychiatric: She has a normal mood  and affect. Judgment normal.  Nursing note and vitals reviewed.    ED Treatments / Results  DIAGNOSTIC STUDIES:  Oxygen Saturation is 100% on RA, normal by my interpretation.    COORDINATION OF CARE:  5:01 PM Discussed treatment plan with pt at bedside and pt agreed to plan.   Labs (all labs ordered are listed, but only abnormal results are displayed) Labs Reviewed - No data to display  EKG  EKG Interpretation None       Radiology Dg Knee Complete 4 Views Left  Result Date: 03/29/2016 CLINICAL DATA:  Left knee pain EXAM: LEFT KNEE - COMPLETE 4+ VIEW COMPARISON:  None. FINDINGS: Four views of the left knee submitted. No acute fracture or subluxation. Diffuse narrowing of joint space most significant medially. There is spurring of medial and lateral tibial plateau. Spurring of medial femoral condyle. Significant narrowing of patellofemoral joint space. Spurring of patella. Small joint effusion. IMPRESSION: No acute fracture or subluxation. Osteoarthritic changes as described above. Electronically Signed   By: Lahoma Crocker M.D.   On: 03/29/2016 16:52   Dg Femur Min 2 Views Left  Result Date: 03/29/2016 CLINICAL DATA:  Anterior left knee pain in medial and posterior left proximal femur pain after fall. EXAM: LEFT FEMUR 2 VIEWS COMPARISON:  None. FINDINGS: There is no evidence of fracture or other focal bone lesions. Soft tissues are unremarkable. Mild to moderate degenerative changes are noted involving the left hip. There are also degenerative changes involving the left knee. IMPRESSION: 1. No acute findings. 2. Left hip and left knee osteoarthritis. 3. If there is high clinical suspicion for occult hip fracture or the patient refuses to weightbear, consider further evaluation with MRI. Although CT is expeditious, evidence is lacking regarding accuracy of CT over plain film radiography. Electronically Signed   By: Kerby Moors M.D.   On: 03/29/2016 17:03    Procedures Procedures  (including critical care time)  Medications Ordered in ED Medications  acetaminophen (TYLENOL) tablet 1,000 mg (1,000 mg Oral Given 03/29/16 1753)  Initial Impression / Assessment and Plan / ED Course  I have reviewed the triage vital signs and the nursing notes.  Pertinent imaging results that were available during my care of the patient were reviewed by me and considered in my medical decision making (see chart for details).  Clinical Course    PT w/ L knee and L thigh pain after mechanical fall yesterday. Neurovascularly intact. Able to bear weight but with pain. Plain films of knee show osteoarthritic changes with no acute findings. Femur films reassuring. The patient states that the majority of her pain is in her knee therefore I feel occult hip fx is unlikely. Contacted case manager Mariann Laster, appreciate assistance. She provided pt w/ walker to assist w/ safe ambulation at home. Gave the patient Tylenol, she stated that she was not able to tolerate any narcotics. I recommended follow-up with PCP in one week if symptoms are not improved to schedule outpatient MRI of knee. She voiced understanding of plan and was discharged in satisfactory condition. Final Clinical Impressions(s) / ED Diagnoses   Final diagnoses:  Sprain of other ligament of left knee, initial encounter  Left thigh pain    New Prescriptions New Prescriptions   No medications on file  I personally performed the services described in this documentation, which was scribed in my presence. The recorded information has been reviewed and is accurate.     Sharlett Iles, MD 03/29/16 2031

## 2016-03-29 NOTE — ED Triage Notes (Signed)
Pt sts left knee pain after falling yesterday; pt sts she has breast CA with mets to bone

## 2016-04-02 ENCOUNTER — Ambulatory Visit: Payer: Medicare Other | Admitting: Nurse Practitioner

## 2016-04-04 ENCOUNTER — Other Ambulatory Visit (HOSPITAL_BASED_OUTPATIENT_CLINIC_OR_DEPARTMENT_OTHER): Payer: Medicare Other

## 2016-04-04 ENCOUNTER — Ambulatory Visit (HOSPITAL_BASED_OUTPATIENT_CLINIC_OR_DEPARTMENT_OTHER): Payer: Medicare Other

## 2016-04-04 VITALS — BP 160/70 | HR 65 | Temp 97.5°F

## 2016-04-04 DIAGNOSIS — C7951 Secondary malignant neoplasm of bone: Secondary | ICD-10-CM | POA: Diagnosis not present

## 2016-04-04 DIAGNOSIS — C50812 Malignant neoplasm of overlapping sites of left female breast: Secondary | ICD-10-CM | POA: Diagnosis not present

## 2016-04-04 DIAGNOSIS — C773 Secondary and unspecified malignant neoplasm of axilla and upper limb lymph nodes: Secondary | ICD-10-CM | POA: Diagnosis not present

## 2016-04-04 DIAGNOSIS — C50912 Malignant neoplasm of unspecified site of left female breast: Secondary | ICD-10-CM | POA: Diagnosis not present

## 2016-04-04 LAB — CBC WITH DIFFERENTIAL/PLATELET
BASO%: 0.7 % (ref 0.0–2.0)
Basophils Absolute: 0 10*3/uL (ref 0.0–0.1)
EOS ABS: 0.2 10*3/uL (ref 0.0–0.5)
EOS%: 5 % (ref 0.0–7.0)
HEMATOCRIT: 37.3 % (ref 34.8–46.6)
HGB: 12.3 g/dL (ref 11.6–15.9)
LYMPH%: 30.5 % (ref 14.0–49.7)
MCH: 28.7 pg (ref 25.1–34.0)
MCHC: 32.9 g/dL (ref 31.5–36.0)
MCV: 87.4 fL (ref 79.5–101.0)
MONO#: 0.1 10*3/uL (ref 0.1–0.9)
MONO%: 4.1 % (ref 0.0–14.0)
NEUT%: 59.7 % (ref 38.4–76.8)
NEUTROS ABS: 1.9 10*3/uL (ref 1.5–6.5)
PLATELETS: 257 10*3/uL (ref 145–400)
RBC: 4.27 10*6/uL (ref 3.70–5.45)
RDW: 15.4 % — AB (ref 11.2–14.5)
WBC: 3.1 10*3/uL — ABNORMAL LOW (ref 3.9–10.3)
lymph#: 0.9 10*3/uL (ref 0.9–3.3)

## 2016-04-04 LAB — COMPREHENSIVE METABOLIC PANEL
ALT: 9 U/L (ref 0–55)
ANION GAP: 9 meq/L (ref 3–11)
AST: 13 U/L (ref 5–34)
Albumin: 3.6 g/dL (ref 3.5–5.0)
Alkaline Phosphatase: 102 U/L (ref 40–150)
BILIRUBIN TOTAL: 1.09 mg/dL (ref 0.20–1.20)
BUN: 19.2 mg/dL (ref 7.0–26.0)
CALCIUM: 9.4 mg/dL (ref 8.4–10.4)
CO2: 27 meq/L (ref 22–29)
CREATININE: 0.9 mg/dL (ref 0.6–1.1)
Chloride: 106 mEq/L (ref 98–109)
EGFR: 83 mL/min/{1.73_m2} — ABNORMAL LOW (ref >90)
Glucose: 102 mg/dl (ref 70–140)
Potassium: 4.3 mEq/L (ref 3.5–5.1)
Sodium: 142 mEq/L (ref 136–145)
Total Protein: 7.1 g/dL (ref 6.4–8.3)

## 2016-04-04 MED ORDER — DENOSUMAB 120 MG/1.7ML ~~LOC~~ SOLN
120.0000 mg | Freq: Once | SUBCUTANEOUS | Status: AC
  Administered 2016-04-04: 120 mg via SUBCUTANEOUS
  Filled 2016-04-04: qty 1.7

## 2016-04-04 NOTE — Patient Instructions (Signed)
Denosumab injection  What is this medicine?  DENOSUMAB (den oh sue mab) slows bone breakdown. Prolia is used to treat osteoporosis in women after menopause and in men. Xgeva is used to prevent bone fractures and other bone problems caused by cancer bone metastases. Xgeva is also used to treat giant cell tumor of the bone.  This medicine may be used for other purposes; ask your health care provider or pharmacist if you have questions.  What should I tell my health care provider before I take this medicine?  They need to know if you have any of these conditions:  -dental disease  -eczema  -infection or history of infections  -kidney disease or on dialysis  -low blood calcium or vitamin D  -malabsorption syndrome  -scheduled to have surgery or tooth extraction  -taking medicine that contains denosumab  -thyroid or parathyroid disease  -an unusual reaction to denosumab, other medicines, foods, dyes, or preservatives  -pregnant or trying to get pregnant  -breast-feeding  How should I use this medicine?  This medicine is for injection under the skin. It is given by a health care professional in a hospital or clinic setting.  If you are getting Prolia, a special MedGuide will be given to you by the pharmacist with each prescription and refill. Be sure to read this information carefully each time.  For Prolia, talk to your pediatrician regarding the use of this medicine in children. Special care may be needed. For Xgeva, talk to your pediatrician regarding the use of this medicine in children. While this drug may be prescribed for children as young as 13 years for selected conditions, precautions do apply.  Overdosage: If you think you have taken too much of this medicine contact a poison control center or emergency room at once.  NOTE: This medicine is only for you. Do not share this medicine with others.  What if I miss a dose?  It is important not to miss your dose. Call your doctor or health care professional if you are  unable to keep an appointment.  What may interact with this medicine?  Do not take this medicine with any of the following medications:  -other medicines containing denosumab  This medicine may also interact with the following medications:  -medicines that suppress the immune system  -medicines that treat cancer  -steroid medicines like prednisone or cortisone  This list may not describe all possible interactions. Give your health care provider a list of all the medicines, herbs, non-prescription drugs, or dietary supplements you use. Also tell them if you smoke, drink alcohol, or use illegal drugs. Some items may interact with your medicine.  What should I watch for while using this medicine?  Visit your doctor or health care professional for regular checks on your progress. Your doctor or health care professional may order blood tests and other tests to see how you are doing.  Call your doctor or health care professional if you get a cold or other infection while receiving this medicine. Do not treat yourself. This medicine may decrease your body's ability to fight infection.  You should make sure you get enough calcium and vitamin D while you are taking this medicine, unless your doctor tells you not to. Discuss the foods you eat and the vitamins you take with your health care professional.  See your dentist regularly. Brush and floss your teeth as directed. Before you have any dental work done, tell your dentist you are receiving this medicine.  Do   not become pregnant while taking this medicine or for 5 months after stopping it. Women should inform their doctor if they wish to become pregnant or think they might be pregnant. There is a potential for serious side effects to an unborn child. Talk to your health care professional or pharmacist for more information.  What side effects may I notice from receiving this medicine?  Side effects that you should report to your doctor or health care professional as soon as  possible:  -allergic reactions like skin rash, itching or hives, swelling of the face, lips, or tongue  -breathing problems  -chest pain  -fast, irregular heartbeat  -feeling faint or lightheaded, falls  -fever, chills, or any other sign of infection  -muscle spasms, tightening, or twitches  -numbness or tingling  -skin blisters or bumps, or is dry, peels, or red  -slow healing or unexplained pain in the mouth or jaw  -unusual bleeding or bruising  Side effects that usually do not require medical attention (Report these to your doctor or health care professional if they continue or are bothersome.):  -muscle pain  -stomach upset, gas  This list may not describe all possible side effects. Call your doctor for medical advice about side effects. You may report side effects to FDA at 1-800-FDA-1088.  Where should I keep my medicine?  This medicine is only given in a clinic, doctor's office, or other health care setting and will not be stored at home.  NOTE: This sheet is a summary. It may not cover all possible information. If you have questions about this medicine, talk to your doctor, pharmacist, or health care provider.      2016, [employer]vier/Gold Standard. (2011-09-10 12:37:47)

## 2016-04-10 ENCOUNTER — Other Ambulatory Visit: Payer: Self-pay | Admitting: Internal Medicine

## 2016-04-24 ENCOUNTER — Encounter: Payer: Self-pay | Admitting: Internal Medicine

## 2016-04-26 DIAGNOSIS — C50919 Malignant neoplasm of unspecified site of unspecified female breast: Secondary | ICD-10-CM | POA: Diagnosis not present

## 2016-05-02 ENCOUNTER — Other Ambulatory Visit: Payer: Medicare Other

## 2016-05-02 ENCOUNTER — Ambulatory Visit: Payer: Medicare Other

## 2016-05-24 DIAGNOSIS — C50919 Malignant neoplasm of unspecified site of unspecified female breast: Secondary | ICD-10-CM | POA: Diagnosis not present

## 2016-05-30 ENCOUNTER — Ambulatory Visit (HOSPITAL_BASED_OUTPATIENT_CLINIC_OR_DEPARTMENT_OTHER): Payer: Medicare HMO

## 2016-05-30 ENCOUNTER — Other Ambulatory Visit (HOSPITAL_BASED_OUTPATIENT_CLINIC_OR_DEPARTMENT_OTHER): Payer: Medicare HMO

## 2016-05-30 VITALS — BP 150/78 | HR 66 | Temp 98.7°F | Resp 20

## 2016-05-30 DIAGNOSIS — C50812 Malignant neoplasm of overlapping sites of left female breast: Secondary | ICD-10-CM | POA: Diagnosis not present

## 2016-05-30 DIAGNOSIS — C7951 Secondary malignant neoplasm of bone: Secondary | ICD-10-CM

## 2016-05-30 LAB — CBC WITH DIFFERENTIAL/PLATELET
BASO%: 1.5 % (ref 0.0–2.0)
BASOS ABS: 0 10*3/uL (ref 0.0–0.1)
EOS ABS: 0.2 10*3/uL (ref 0.0–0.5)
EOS%: 7.2 % — ABNORMAL HIGH (ref 0.0–7.0)
HEMATOCRIT: 35.3 % (ref 34.8–46.6)
HGB: 11.8 g/dL (ref 11.6–15.9)
LYMPH#: 1 10*3/uL (ref 0.9–3.3)
LYMPH%: 38.4 % (ref 14.0–49.7)
MCH: 29.5 pg (ref 25.1–34.0)
MCHC: 33.5 g/dL (ref 31.5–36.0)
MCV: 88 fL (ref 79.5–101.0)
MONO#: 0.2 10*3/uL (ref 0.1–0.9)
MONO%: 6.7 % (ref 0.0–14.0)
NEUT#: 1.2 10*3/uL — ABNORMAL LOW (ref 1.5–6.5)
NEUT%: 46.2 % (ref 38.4–76.8)
PLATELETS: 214 10*3/uL (ref 145–400)
RBC: 4 10*6/uL (ref 3.70–5.45)
RDW: 16.6 % — ABNORMAL HIGH (ref 11.2–14.5)
WBC: 2.6 10*3/uL — ABNORMAL LOW (ref 3.9–10.3)

## 2016-05-30 LAB — COMPREHENSIVE METABOLIC PANEL
ALBUMIN: 3.7 g/dL (ref 3.5–5.0)
ALT: 8 U/L (ref 0–55)
AST: 14 U/L (ref 5–34)
Alkaline Phosphatase: 96 U/L (ref 40–150)
Anion Gap: 6 mEq/L (ref 3–11)
BUN: 9.5 mg/dL (ref 7.0–26.0)
CO2: 26 meq/L (ref 22–29)
Calcium: 8.9 mg/dL (ref 8.4–10.4)
Chloride: 110 mEq/L — ABNORMAL HIGH (ref 98–109)
Creatinine: 0.8 mg/dL (ref 0.6–1.1)
EGFR: 89 mL/min/{1.73_m2} — AB (ref >90)
Glucose: 84 mg/dl (ref 70–140)
POTASSIUM: 4.2 meq/L (ref 3.5–5.1)
SODIUM: 142 meq/L (ref 136–145)
Total Bilirubin: 1.01 mg/dL (ref 0.20–1.20)
Total Protein: 7.3 g/dL (ref 6.4–8.3)

## 2016-05-30 MED ORDER — DENOSUMAB 120 MG/1.7ML ~~LOC~~ SOLN
120.0000 mg | Freq: Once | SUBCUTANEOUS | Status: AC
  Administered 2016-05-30: 120 mg via SUBCUTANEOUS
  Filled 2016-05-30: qty 1.7

## 2016-05-30 NOTE — Patient Instructions (Signed)
Denosumab injection  What is this medicine?  DENOSUMAB (den oh sue mab) slows bone breakdown. Prolia is used to treat osteoporosis in women after menopause and in men. Xgeva is used to prevent bone fractures and other bone problems caused by cancer bone metastases. Xgeva is also used to treat giant cell tumor of the bone.  This medicine may be used for other purposes; ask your health care provider or pharmacist if you have questions.  What should I tell my health care provider before I take this medicine?  They need to know if you have any of these conditions:  -dental disease  -eczema  -infection or history of infections  -kidney disease or on dialysis  -low blood calcium or vitamin D  -malabsorption syndrome  -scheduled to have surgery or tooth extraction  -taking medicine that contains denosumab  -thyroid or parathyroid disease  -an unusual reaction to denosumab, other medicines, foods, dyes, or preservatives  -pregnant or trying to get pregnant  -breast-feeding  How should I use this medicine?  This medicine is for injection under the skin. It is given by a health care professional in a hospital or clinic setting.  If you are getting Prolia, a special MedGuide will be given to you by the pharmacist with each prescription and refill. Be sure to read this information carefully each time.  For Prolia, talk to your pediatrician regarding the use of this medicine in children. Special care may be needed. For Xgeva, talk to your pediatrician regarding the use of this medicine in children. While this drug may be prescribed for children as young as 13 years for selected conditions, precautions do apply.  Overdosage: If you think you have taken too much of this medicine contact a poison control center or emergency room at once.  NOTE: This medicine is only for you. Do not share this medicine with others.  What if I miss a dose?  It is important not to miss your dose. Call your doctor or health care professional if you are  unable to keep an appointment.  What may interact with this medicine?  Do not take this medicine with any of the following medications:  -other medicines containing denosumab  This medicine may also interact with the following medications:  -medicines that suppress the immune system  -medicines that treat cancer  -steroid medicines like prednisone or cortisone  This list may not describe all possible interactions. Give your health care provider a list of all the medicines, herbs, non-prescription drugs, or dietary supplements you use. Also tell them if you smoke, drink alcohol, or use illegal drugs. Some items may interact with your medicine.  What should I watch for while using this medicine?  Visit your doctor or health care professional for regular checks on your progress. Your doctor or health care professional may order blood tests and other tests to see how you are doing.  Call your doctor or health care professional if you get a cold or other infection while receiving this medicine. Do not treat yourself. This medicine may decrease your body's ability to fight infection.  You should make sure you get enough calcium and vitamin D while you are taking this medicine, unless your doctor tells you not to. Discuss the foods you eat and the vitamins you take with your health care professional.  See your dentist regularly. Brush and floss your teeth as directed. Before you have any dental work done, tell your dentist you are receiving this medicine.  Do   not become pregnant while taking this medicine or for 5 months after stopping it. Women should inform their doctor if they wish to become pregnant or think they might be pregnant. There is a potential for serious side effects to an unborn child. Talk to your health care professional or pharmacist for more information.  What side effects may I notice from receiving this medicine?  Side effects that you should report to your doctor or health care professional as soon as  possible:  -allergic reactions like skin rash, itching or hives, swelling of the face, lips, or tongue  -breathing problems  -chest pain  -fast, irregular heartbeat  -feeling faint or lightheaded, falls  -fever, chills, or any other sign of infection  -muscle spasms, tightening, or twitches  -numbness or tingling  -skin blisters or bumps, or is dry, peels, or red  -slow healing or unexplained pain in the mouth or jaw  -unusual bleeding or bruising  Side effects that usually do not require medical attention (Report these to your doctor or health care professional if they continue or are bothersome.):  -muscle pain  -stomach upset, gas  This list may not describe all possible side effects. Call your doctor for medical advice about side effects. You may report side effects to FDA at 1-800-FDA-1088.  Where should I keep my medicine?  This medicine is only given in a clinic, doctor's office, or other health care setting and will not be stored at home.  NOTE: This sheet is a summary. It may not cover all possible information. If you have questions about this medicine, talk to your doctor, pharmacist, or health care provider.      2016, [employer]vier/Gold Standard. (2011-09-10 12:37:47)

## 2016-06-08 ENCOUNTER — Ambulatory Visit: Payer: Medicare Other | Admitting: Internal Medicine

## 2016-06-26 ENCOUNTER — Other Ambulatory Visit: Payer: Self-pay | Admitting: Oncology

## 2016-06-27 ENCOUNTER — Other Ambulatory Visit (HOSPITAL_BASED_OUTPATIENT_CLINIC_OR_DEPARTMENT_OTHER): Payer: Medicare Other

## 2016-06-27 ENCOUNTER — Ambulatory Visit (HOSPITAL_BASED_OUTPATIENT_CLINIC_OR_DEPARTMENT_OTHER): Payer: Medicare Other

## 2016-06-27 VITALS — BP 155/82 | HR 66 | Temp 97.9°F | Resp 18

## 2016-06-27 DIAGNOSIS — C50812 Malignant neoplasm of overlapping sites of left female breast: Secondary | ICD-10-CM

## 2016-06-27 DIAGNOSIS — C7951 Secondary malignant neoplasm of bone: Secondary | ICD-10-CM

## 2016-06-27 LAB — CBC WITH DIFFERENTIAL/PLATELET
BASO%: 0.9 % (ref 0.0–2.0)
Basophils Absolute: 0 10*3/uL (ref 0.0–0.1)
EOS%: 9.7 % — AB (ref 0.0–7.0)
Eosinophils Absolute: 0.5 10*3/uL (ref 0.0–0.5)
HEMATOCRIT: 36.2 % (ref 34.8–46.6)
HEMOGLOBIN: 12.2 g/dL (ref 11.6–15.9)
LYMPH#: 1.3 10*3/uL (ref 0.9–3.3)
LYMPH%: 25.3 % (ref 14.0–49.7)
MCH: 29.4 pg (ref 25.1–34.0)
MCHC: 33.7 g/dL (ref 31.5–36.0)
MCV: 87.3 fL (ref 79.5–101.0)
MONO#: 0.4 10*3/uL (ref 0.1–0.9)
MONO%: 8.2 % (ref 0.0–14.0)
NEUT#: 2.8 10*3/uL (ref 1.5–6.5)
NEUT%: 55.9 % (ref 38.4–76.8)
Platelets: 258 10*3/uL (ref 145–400)
RBC: 4.15 10*6/uL (ref 3.70–5.45)
RDW: 16.6 % — AB (ref 11.2–14.5)
WBC: 5 10*3/uL (ref 3.9–10.3)

## 2016-06-27 LAB — COMPREHENSIVE METABOLIC PANEL
ALT: 8 U/L (ref 0–55)
AST: 14 U/L (ref 5–34)
Albumin: 3.8 g/dL (ref 3.5–5.0)
Alkaline Phosphatase: 95 U/L (ref 40–150)
Anion Gap: 10 mEq/L (ref 3–11)
BUN: 13.9 mg/dL (ref 7.0–26.0)
CO2: 23 mEq/L (ref 22–29)
Calcium: 9.5 mg/dL (ref 8.4–10.4)
Chloride: 109 mEq/L (ref 98–109)
Creatinine: 0.8 mg/dL (ref 0.6–1.1)
EGFR: >90 mL/min/{1.73_m2} (ref >90)
Glucose: 103 mg/dl (ref 70–140)
Potassium: 4.1 mEq/L (ref 3.5–5.1)
Sodium: 141 mEq/L (ref 136–145)
Total Bilirubin: 0.85 mg/dL (ref 0.20–1.20)
Total Protein: 7.3 g/dL (ref 6.4–8.3)

## 2016-06-27 MED ORDER — DENOSUMAB 120 MG/1.7ML ~~LOC~~ SOLN
120.0000 mg | Freq: Once | SUBCUTANEOUS | Status: AC
  Administered 2016-06-27: 120 mg via SUBCUTANEOUS
  Filled 2016-06-27: qty 1.7

## 2016-06-27 NOTE — Patient Instructions (Signed)
Denosumab injection  What is this medicine?  DENOSUMAB (den oh sue mab) slows bone breakdown. Prolia is used to treat osteoporosis in women after menopause and in men. Xgeva is used to prevent bone fractures and other bone problems caused by cancer bone metastases. Xgeva is also used to treat giant cell tumor of the bone.  This medicine may be used for other purposes; ask your health care provider or pharmacist if you have questions.  What should I tell my health care provider before I take this medicine?  They need to know if you have any of these conditions:  -dental disease  -eczema  -infection or history of infections  -kidney disease or on dialysis  -low blood calcium or vitamin D  -malabsorption syndrome  -scheduled to have surgery or tooth extraction  -taking medicine that contains denosumab  -thyroid or parathyroid disease  -an unusual reaction to denosumab, other medicines, foods, dyes, or preservatives  -pregnant or trying to get pregnant  -breast-feeding  How should I use this medicine?  This medicine is for injection under the skin. It is given by a health care professional in a hospital or clinic setting.  If you are getting Prolia, a special MedGuide will be given to you by the pharmacist with each prescription and refill. Be sure to read this information carefully each time.  For Prolia, talk to your pediatrician regarding the use of this medicine in children. Special care may be needed. For Xgeva, talk to your pediatrician regarding the use of this medicine in children. While this drug may be prescribed for children as young as 13 years for selected conditions, precautions do apply.  Overdosage: If you think you have taken too much of this medicine contact a poison control center or emergency room at once.  NOTE: This medicine is only for you. Do not share this medicine with others.  What if I miss a dose?  It is important not to miss your dose. Call your doctor or health care professional if you are  unable to keep an appointment.  What may interact with this medicine?  Do not take this medicine with any of the following medications:  -other medicines containing denosumab  This medicine may also interact with the following medications:  -medicines that suppress the immune system  -medicines that treat cancer  -steroid medicines like prednisone or cortisone  This list may not describe all possible interactions. Give your health care provider a list of all the medicines, herbs, non-prescription drugs, or dietary supplements you use. Also tell them if you smoke, drink alcohol, or use illegal drugs. Some items may interact with your medicine.  What should I watch for while using this medicine?  Visit your doctor or health care professional for regular checks on your progress. Your doctor or health care professional may order blood tests and other tests to see how you are doing.  Call your doctor or health care professional if you get a cold or other infection while receiving this medicine. Do not treat yourself. This medicine may decrease your body's ability to fight infection.  You should make sure you get enough calcium and vitamin D while you are taking this medicine, unless your doctor tells you not to. Discuss the foods you eat and the vitamins you take with your health care professional.  See your dentist regularly. Brush and floss your teeth as directed. Before you have any dental work done, tell your dentist you are receiving this medicine.  Do   not become pregnant while taking this medicine or for 5 months after stopping it. Women should inform their doctor if they wish to become pregnant or think they might be pregnant. There is a potential for serious side effects to an unborn child. Talk to your health care professional or pharmacist for more information.  What side effects may I notice from receiving this medicine?  Side effects that you should report to your doctor or health care professional as soon as  possible:  -allergic reactions like skin rash, itching or hives, swelling of the face, lips, or tongue  -breathing problems  -chest pain  -fast, irregular heartbeat  -feeling faint or lightheaded, falls  -fever, chills, or any other sign of infection  -muscle spasms, tightening, or twitches  -numbness or tingling  -skin blisters or bumps, or is dry, peels, or red  -slow healing or unexplained pain in the mouth or jaw  -unusual bleeding or bruising  Side effects that usually do not require medical attention (Report these to your doctor or health care professional if they continue or are bothersome.):  -muscle pain  -stomach upset, gas  This list may not describe all possible side effects. Call your doctor for medical advice about side effects. You may report side effects to FDA at 1-800-FDA-1088.  Where should I keep my medicine?  This medicine is only given in a clinic, doctor's office, or other health care setting and will not be stored at home.  NOTE: This sheet is a summary. It may not cover all possible information. If you have questions about this medicine, talk to your doctor, pharmacist, or health care provider.      2016, [employer]vier/Gold Standard. (2011-09-10 12:37:47)

## 2016-07-03 ENCOUNTER — Other Ambulatory Visit: Payer: Self-pay

## 2016-07-03 ENCOUNTER — Encounter (HOSPITAL_COMMUNITY): Payer: Self-pay | Admitting: Nurse Practitioner

## 2016-07-03 ENCOUNTER — Emergency Department (HOSPITAL_COMMUNITY): Payer: Medicare Other

## 2016-07-03 ENCOUNTER — Emergency Department (HOSPITAL_COMMUNITY)
Admission: EM | Admit: 2016-07-03 | Discharge: 2016-07-03 | Disposition: A | Discharge status: Home / Self Care | Payer: Medicare Other | Attending: Emergency Medicine | Admitting: Emergency Medicine

## 2016-07-03 DIAGNOSIS — R531 Weakness: Secondary | ICD-10-CM | POA: Diagnosis not present

## 2016-07-03 DIAGNOSIS — Z79899 Other long term (current) drug therapy: Secondary | ICD-10-CM | POA: Insufficient documentation

## 2016-07-03 DIAGNOSIS — R404 Transient alteration of awareness: Secondary | ICD-10-CM | POA: Diagnosis not present

## 2016-07-03 DIAGNOSIS — R109 Unspecified abdominal pain: Secondary | ICD-10-CM | POA: Diagnosis not present

## 2016-07-03 DIAGNOSIS — Z91040 Latex allergy status: Secondary | ICD-10-CM | POA: Insufficient documentation

## 2016-07-03 DIAGNOSIS — I10 Essential (primary) hypertension: Secondary | ICD-10-CM | POA: Diagnosis not present

## 2016-07-03 DIAGNOSIS — N898 Other specified noninflammatory disorders of vagina: Secondary | ICD-10-CM | POA: Diagnosis not present

## 2016-07-03 DIAGNOSIS — Z853 Personal history of malignant neoplasm of breast: Secondary | ICD-10-CM | POA: Insufficient documentation

## 2016-07-03 DIAGNOSIS — Z87891 Personal history of nicotine dependence: Secondary | ICD-10-CM | POA: Diagnosis not present

## 2016-07-03 DIAGNOSIS — J45909 Unspecified asthma, uncomplicated: Secondary | ICD-10-CM | POA: Insufficient documentation

## 2016-07-03 DIAGNOSIS — R0602 Shortness of breath: Secondary | ICD-10-CM | POA: Diagnosis not present

## 2016-07-03 LAB — COMPREHENSIVE METABOLIC PANEL
ALBUMIN: 4 g/dL (ref 3.5–5.0)
ALK PHOS: 78 U/L (ref 38–126)
ALT: 10 U/L — AB (ref 14–54)
AST: 19 U/L (ref 15–41)
Anion gap: 4 — ABNORMAL LOW (ref 5–15)
BILIRUBIN TOTAL: 1.1 mg/dL (ref 0.3–1.2)
BUN: 11 mg/dL (ref 6–20)
CALCIUM: 9.1 mg/dL (ref 8.9–10.3)
CO2: 28 mmol/L (ref 22–32)
CREATININE: 0.81 mg/dL (ref 0.44–1.00)
Chloride: 108 mmol/L (ref 101–111)
GFR calc Af Amer: >60 mL/min (ref >60)
GFR calc non Af Amer: >60 mL/min (ref >60)
GLUCOSE: 107 mg/dL — AB (ref 65–99)
Potassium: 4.4 mmol/L (ref 3.5–5.1)
SODIUM: 140 mmol/L (ref 135–145)
TOTAL PROTEIN: 7.6 g/dL (ref 6.5–8.1)

## 2016-07-03 LAB — URINALYSIS, ROUTINE W REFLEX MICROSCOPIC
BACTERIA UA: NONE SEEN
Bilirubin Urine: NEGATIVE
Glucose, UA: NEGATIVE mg/dL
HGB URINE DIPSTICK: NEGATIVE
Ketones, ur: NEGATIVE mg/dL
NITRITE: NEGATIVE
PH: 7 (ref 5.0–8.0)
Protein, ur: NEGATIVE mg/dL
RBC / HPF: NONE SEEN RBC/hpf (ref 0–5)
SPECIFIC GRAVITY, URINE: 1.016 (ref 1.005–1.030)

## 2016-07-03 LAB — CBC WITH DIFFERENTIAL/PLATELET
BASOS PCT: 0 %
Basophils Absolute: 0 10*3/uL (ref 0.0–0.1)
EOS ABS: 0.4 10*3/uL (ref 0.0–0.7)
EOS PCT: 9 %
HEMATOCRIT: 35.1 % — AB (ref 36.0–46.0)
Hemoglobin: 11.8 g/dL — ABNORMAL LOW (ref 12.0–15.0)
Lymphocytes Relative: 29 %
Lymphs Abs: 1.3 10*3/uL (ref 0.7–4.0)
MCH: 28.3 pg (ref 26.0–34.0)
MCHC: 33.6 g/dL (ref 30.0–36.0)
MCV: 84.2 fL (ref 78.0–100.0)
MONO ABS: 0.2 10*3/uL (ref 0.1–1.0)
MONOS PCT: 5 %
NEUTROS ABS: 2.4 10*3/uL (ref 1.7–7.7)
Neutrophils Relative %: 57 %
PLATELETS: 248 10*3/uL (ref 150–400)
RBC: 4.17 MIL/uL (ref 3.87–5.11)
RDW: 15 % (ref 11.5–15.5)
WBC: 4.3 10*3/uL (ref 4.0–10.5)

## 2016-07-03 LAB — I-STAT TROPONIN, ED: TROPONIN I, POC: 0 ng/mL (ref 0.00–0.08)

## 2016-07-03 LAB — WET PREP, GENITAL
CLUE CELLS WET PREP: NONE SEEN
Sperm: NONE SEEN
Trich, Wet Prep: NONE SEEN
Yeast Wet Prep HPF POC: NONE SEEN

## 2016-07-03 MED ORDER — GABAPENTIN 300 MG PO CAPS: 300.0000 mg | ORAL_CAPSULE | Freq: Three times a day (TID) | ORAL | 0 refills | Status: DC

## 2016-07-03 MED ORDER — IOPAMIDOL (ISOVUE-370) INJECTION 76%
100.0000 mL | Freq: Once | INTRAVENOUS | Status: AC | PRN
  Administered 2016-07-03: 100 mL via INTRAVENOUS

## 2016-07-03 MED ORDER — MORPHINE SULFATE (PF) 2 MG/ML IV SOLN
4.0000 mg | Freq: Once | INTRAVENOUS | Status: AC
  Administered 2016-07-03: 4 mg via INTRAVENOUS
  Filled 2016-07-03: qty 2

## 2016-07-03 MED ORDER — IOPAMIDOL (ISOVUE-370) INJECTION 76%
INTRAVENOUS | Status: AC
  Filled 2016-07-03: qty 100

## 2016-07-03 NOTE — ED Notes (Signed)
Pt requested to be ambulated with oxygen saturation. Pt ambulated with walker no assistance. However c/o right hip and groin pain with ambulation. Steady gait. Pt 02 sat 86%-100% when ambulating. Pt reports feeling fatigued upon ambulation

## 2016-07-03 NOTE — ED Provider Notes (Signed)
Michaela Little Provider Note   CSN: [id number] Arrival date & time: 07/03/16  1342     History   Chief Complaint No chief complaint on file.   HPI Michaela Little is a 56 y.o. female.  Patient with pmh of metastatic breast cancer, presents with SOB, chest pain, L sided flank pain. States the SOB and chest pain increase with movement, even walking to bathroom or kitchen. Pain is localized to center of chest.  She also c/o of weakness in her legs and feeling like her legs are going to "give out."  No urinary retention or incontinence reported. Patient reports hx of chronic UTIs with multiple abx given. She is not currently on abx. She states burning with urination and L flank pain.  Denies fever. Prior abdominal surgeries include hysterectomy and CCX. Also c/o thick, brown vaginal discharge. Patient denies being sexually active and is not concerned about STDs. Discharge has been going on for a week. Denies hemoptysis, recent travel, history of clots, palpitations, leg swelling, numbness, vomiting, diarrhea, constipation, headache, vision changes, loss of consciousness or AMS. Patient ambulates at baseline with walker. Of note, patient had mastectomy for breast cancer in past. However, she states her most recent bone scan (done in 2017) showed metastatic spread to her spine and pelvis.      Past Medical History:  Diagnosis Date  . Anxiety   . Arthritis    hands, knees, hips  . Asthma   . Cancer ([place])   . Cervical stenosis (uterine cervix)   . Dyspnea on exertion   . GERD (gastroesophageal reflux disease)   . Headache(784.0)   . History of breast cancer    2010--  LEFT  s/p  mastectomy (in [place]) AND CHEMORADIATION--  NO RECURRENCE  . History of cervical dysplasia   . History of TB skin testing    AS TEEN--  [place]/ MEDS  . Hyperlipidemia   . Hypertension   . OSA (obstructive sleep apnea) moderate osa per study  09/2010   CPAP  , NOT USING ON REGULAR BASIS  . Pelvic pain  in female   . Positive H. pylori test    08-05-2013  . Refusal of blood transfusions as patient is Jehovah's Witness   . Seasonal allergies   . Uterine fibroid   . Wears glasses     Patient Active Problem List   Diagnosis Date Noted  . Multinodular thyroid 02/08/2016  . Lumbar back pain with radiculopathy affecting left lower extremity 02/08/2016  . Carcinoma of breast metastatic to bone ([place]) 02/08/2016  . Bone metastases ([place]) 11/13/2015  . FH: TAH-BSO (total abdominal hysterectomy and bilateral salpingo-oophorectomy) 10/27/2014  . Right thyroid nodule 06/30/2014  . Allergic rhinitis due to pollen 06/30/2014  . Essential hypertension 12/24/2013  . Left ventricular diastolic dysfunction with preserved systolic function 67/02/4579  . Lump in neck 08/11/2013  . Abdominal pain, chronic, bilateral lower quadrant 08/05/2013  . Abdominal pain, epigastric 08/05/2013  . Severe obesity (BMI >= 40) ([place]) 06/23/2013  . Allergic rhinitis 06/23/2013  . GERD (gastroesophageal reflux disease) 03/25/2013  . Constipation 03/25/2013  . Other and unspecified hyperlipidemia 03/25/2013  . Dysuria 03/25/2013  . Routine general medical examination at a health care facility 03/25/2013  . Cancer of overlapping sites of left female breast ([place]) 03/05/2013  . Cough 10/28/2012  . Dyspnea 10/28/2012  . Chronic abdominal pain 09/02/2012  . Pelvic pain in female 02/06/2012  . Atypical chest pain 11/13/2011  . Hyperlipidemia 11/13/2011  .  Dysplasia of cervix, low grade (CIN 1) 08/22/2011  . Diastolic dysfunction 27/08/2374  . Influenza 03/04/2011  . Hypertension 03/04/2011  . Obesity 03/04/2011  . OSA (obstructive sleep apnea) 02/27/2011  . Asthma 02/08/2011    Past Surgical History:  Procedure Laterality Date  . CARDIOVASCULAR STRESS TEST  10-29-2012   low risk perfusion study/  no significant reversibity/ ef 66%/  normal wall motion  . CERVICAL CONIZATION W/BX  2012   in  [place]  N/A 05/14/2012   Procedure: LAPAROSCOPIC CHOLECYSTECTOMY;  Surgeon: Ralene Ok, MD;  Location: [facility];  Service: General;  Laterality: N/A;  . COLONOSCOPY  2011   normal per patient - [place]  . DILATION AND CURETTAGE OF UTERUS  10/15/2011   Procedure: DILATATION AND CURETTAGE;  Surgeon: Melina Schools, MD;  Location: [facility];  Service: Gynecology;  Laterality: N/A;  Conization &  endocervical curettings  . EXAMINATION UNDER ANESTHESIA N/A 08/20/2013   Procedure: EXAM UNDER ANESTHESIA;  Surgeon: Margarette Asal, MD;  Location: [facility];  Service: Gynecology;  Laterality: N/A;  . [place]   right  . LAPAROSCOPIC ASSISTED VAGINAL HYSTERECTOMY N/A 10/26/2014   Procedure: HYSTERECTOMY ABDOMINAL ;  Surgeon: Molli Posey, MD;  Location: [facility];  Service: Gynecology;  Laterality: N/A;  . MASTECTOMY Left 11/2008  in [place]   . SALPINGOOPHORECTOMY Bilateral 10/26/2014   Procedure: BILATERAL SALPINGO OOPHORECTOMY;  Surgeon: Molli Posey, MD;  Location: [facility];  Service: Gynecology;  Laterality: Bilateral;  . TRANSTHORACIC ECHOCARDIOGRAM  10-29-2012   mild lvh/  ef 60-65%/  grade II diastolic dysfunction/  trivial mr  &  tr    OB History    Gravida Para Term Preterm AB Living   10 5 5   5 5    SAB TAB Ectopic Multiple Live Births   3 2             Home Medications    Prior to Admission medications   Medication Sig Start Date End Date Taking? Authorizing Provider  IBRANCE 100 MG capsule TAKE 1 CAPSULE BY MOUTH DAILY FOR 21 DAYS, THEN 7 DAYS REST. TAKE WHOLE WITH FOOD. 06/26/16  Yes Chauncey Cruel, MD  letrozole [facility]) 2.5 MG tablet Take 1 tablet (2.5 mg total) by mouth daily. 03/07/16  Yes Chauncey Cruel, MD  metoprolol tartrate (LOPRESSOR) 25 MG tablet Take 1 tablet (25 mg total) by mouth 2 (two) times daily. 04/10/16  Yes Gildardo Cranker, DO  promethazine (PHENERGAN) 25 MG tablet Take 1 tablet (25 mg total) by mouth every 6 (six) hours as needed  for nausea or vomiting. 01/17/16  Yes Gildardo Cranker, DO  diphenhydrAMINE (BENADRYL) 25 mg capsule Take 25 mg by mouth every 6 (six) hours as needed for allergies.    Historical Provider, MD  gabapentin (NEURONTIN) 300 MG capsule Take 1 capsule (300 mg total) by mouth 3 (three) times daily. 07/03/16   Meredith Mells, PA-C    Family History Family History  Problem Relation Age of Onset  . Breast cancer Mother   . Colon cancer Mother   . Hypotension Mother   . Asthma Mother   . Diabetes type II Mother   . Arthritis Mother   . Clotting disorder Mother   . Cancer Mother     breast/colon  . Mental illness Brother   . Heart disease Brother   . Cerebral palsy Daughter   . Emphysema Brother  never smoker  . Colon cancer Maternal Aunt   . Cancer Maternal Aunt     colon  . Colon cancer Maternal Uncle   . Cancer Maternal Uncle     colon  . Esophageal cancer Neg Hx   . Rectal cancer Neg Hx   . Stomach cancer Neg Hx   . Thyroid disease Neg Hx     Social History Social History  Substance Use Topics  . Smoking status: Former Smoker    Packs/day: 0.30    Years: 10.00    Types: Cigarettes    Quit date: 03/26/1988  . Smokeless tobacco: Never Used  . Alcohol use No     Allergies   Lisinopril-hydrochlorothiazide; Adhesive [tape]; Calcium; Calcium carbonate antacid; Hctz [hydrochlorothiazide]; Latex; Lisinopril; and Losartan potassium   Review of Systems Review of Systems  Constitutional: Negative for appetite change, chills and fever.  HENT: Negative for ear pain, rhinorrhea, sneezing and sore throat.   Eyes: Negative for photophobia and visual disturbance.  Respiratory: Positive for shortness of breath. Negative for cough, chest tightness and wheezing.   Cardiovascular: Positive for chest pain and leg swelling. Negative for palpitations.  Gastrointestinal: Negative for abdominal pain, blood in stool, constipation, diarrhea, nausea and vomiting.  Genitourinary: Positive for dysuria  and flank pain. Negative for hematuria and urgency.  Musculoskeletal: Positive for back pain and gait problem. Negative for myalgias.  Skin: Negative for rash.  Neurological: Positive for weakness. Negative for dizziness and light-headedness.     Physical Exam Updated Vital Signs BP (!) 146/88 (BP Location: Right Arm)   Pulse 66   Temp 98.3 F (36.8 C) (Oral)   Resp 16   LMP 02/25/2009   SpO2 96%   Physical Exam  Constitutional: She appears well-developed and well-nourished. No distress.  HENT:  Head: Normocephalic and atraumatic.  Nose: Nose normal.  Eyes: Conjunctivae and EOM are normal. Left eye exhibits no discharge. No scleral icterus.  Neck: Normal range of motion. Neck supple.  Cardiovascular: Normal rate, regular rhythm, normal heart sounds and intact distal pulses.  Exam reveals no gallop and no friction rub.   No murmur heard. Pulmonary/Chest: Effort normal and breath sounds normal. No respiratory distress.  Abdominal: Soft. Bowel sounds are normal. She exhibits no distension. There is no tenderness. There is no guarding.  Genitourinary: Vagina normal. There is rash and lesion on the right labia. There is rash and lesion on the left labia. Cervix exhibits discharge. Cervix exhibits no friability.  Musculoskeletal: Normal range of motion. She exhibits no edema.  L sided CVA tenderness. Midline lumbar tenderness.  Neurological: She is alert. She exhibits normal muscle tone. Coordination normal.  Skin: Skin is warm and dry. No rash noted.  Psychiatric: She has a normal mood and affect.  Nursing note and vitals reviewed.    ED Treatments / Results  Labs (all labs ordered are listed, but only abnormal results are displayed) Labs Reviewed  WET PREP, GENITAL - Abnormal; Notable for the following:       Result Value   WBC, Wet Prep HPF POC MANY (*)    All other components within normal limits  COMPREHENSIVE METABOLIC PANEL - Abnormal; Notable for the following:     Glucose, Bld 107 (*)    ALT 10 (*)    Anion gap 4 (*)    All other components within normal limits  CBC WITH DIFFERENTIAL/PLATELET - Abnormal; Notable for the following:    Hemoglobin 11.8 (*)    HCT 35.1 (*)  All other components within normal limits  URINALYSIS, ROUTINE W REFLEX MICROSCOPIC - Abnormal; Notable for the following:    Leukocytes, UA SMALL (*)    Squamous Epithelial / LPF 0-5 (*)    All other components within normal limits  I-STAT TROPOININ, ED  GC/CHLAMYDIA PROBE AMP ([place]) NOT AT [facility]    EKG  EKG Interpretation  Date/Time:  Tuesday July 03 2016 15:43:13 EDT Ventricular Rate:  77 PR Interval:    QRS Duration: 105 QT Interval:  412 QTC Calculation: 467 R Axis:   24 Text Interpretation:  Sinus rhythm Low voltage, precordial leads Minimal ST elevation, inferior leads No significant change since last tracing Confirmed by Maryan Rued  MD, Loree Fee ([id number]) on 07/03/2016 3:46:23 PM       Radiology Ct Angio Chest Pe W And/or Wo Contrast  Result Date: 07/03/2016 CLINICAL DATA:  Shortness of breath. Left flank pain. Generalized weakness. History of breast cancer. EXAM: CT ANGIOGRAPHY CHEST CT ABDOMEN AND PELVIS WITH CONTRAST TECHNIQUE: Multidetector CT imaging of the chest was performed using the standard protocol during bolus administration of intravenous contrast. Multiplanar CT image reconstructions and MIPs were obtained to evaluate the vascular anatomy. Multidetector CT imaging of the abdomen and pelvis was performed using the standard protocol during bolus administration of intravenous contrast. CONTRAST:  100 mL Isovue 370 COMPARISON:  PET-CT 03/01/2016. CT abdomen and pelvis 12/02/2015. Chest CT 11/07/2015. FINDINGS: CTA CHEST FINDINGS Cardiovascular: Pulmonary arterial opacification is adequate without evidence of emboli. The thoracic aorta is normal in caliber. Trace pericardial fluid is noted anteriorly. Mediastinum/Nodes: Prior left mastectomy and left  axillary dissection with left breast implant in place. No enlarged axillary, mediastinal, or hilar lymph nodes. Grossly unremarkable thyroid, trachea, and esophagus. Lungs/Pleura: No pleural effusion or pneumothorax. Mild respiratory motion artifact with minimal dependent atelectasis in both lower lobes. No mass. Musculoskeletal: Diffusely mottled appearance of the osseous structures is unchanged from the prior chest CT. As previously mentioned, metastatic disease is not excluded, however no focal hypermetabolic lesions were identified on the interval PET-CT. No acute osseous abnormality is identified. Review of the MIP images confirms the above findings. CT ABDOMEN and PELVIS FINDINGS Hepatobiliary: Tiny 2 mm hypodensity in the hepatic dome is unchanged and too small to fully characterize. Prior cholecystectomy. Common bile duct dilatation up to 12 mm proximally is unchanged. Pancreas: Unremarkable. Spleen: Unremarkable. Adrenals/Urinary Tract: Unremarkable adrenal glands. Unchanged 1.7 cm cyst in the lower pole of the right kidney. No evidence of renal calculi or hydronephrosis. Unremarkable bladder. Stomach/Bowel: The stomach is within normal limits. No evidence of bowel obstruction or inflammation. Unremarkable appendix. Vascular/Lymphatic: Minimal aortoiliac atherosclerotic calcification. No enlarged lymph nodes. Reproductive: Uterus and bilateral adnexa are unremarkable. Other: No intraperitoneal free fluid. No abdominal wall mass or hernia. Musculoskeletal: Diffusely mottled appearance of the osseous structures as mentioned above. Moderate lower lumbar facet arthrosis. Review of the MIP images confirms the above findings. IMPRESSION: No evidence of pulmonary emboli or other acute abnormality in the chest, abdomen, or pelvis. Electronically Signed   By: Logan Bores M.D.   On: 07/03/2016 17:39   Ct Abdomen Pelvis W Contrast  Result Date: 07/03/2016 CLINICAL DATA:  Shortness of breath. Left flank pain.  Generalized weakness. History of breast cancer. EXAM: CT ANGIOGRAPHY CHEST CT ABDOMEN AND PELVIS WITH CONTRAST TECHNIQUE: Multidetector CT imaging of the chest was performed using the standard protocol during bolus administration of intravenous contrast. Multiplanar CT image reconstructions and MIPs were obtained to evaluate the vascular anatomy. Multidetector CT  imaging of the abdomen and pelvis was performed using the standard protocol during bolus administration of intravenous contrast. CONTRAST:  100 mL Isovue 370 COMPARISON:  PET-CT 03/01/2016. CT abdomen and pelvis 12/02/2015. Chest CT 11/07/2015. FINDINGS: CTA CHEST FINDINGS Cardiovascular: Pulmonary arterial opacification is adequate without evidence of emboli. The thoracic aorta is normal in caliber. Trace pericardial fluid is noted anteriorly. Mediastinum/Nodes: Prior left mastectomy and left axillary dissection with left breast implant in place. No enlarged axillary, mediastinal, or hilar lymph nodes. Grossly unremarkable thyroid, trachea, and esophagus. Lungs/Pleura: No pleural effusion or pneumothorax. Mild respiratory motion artifact with minimal dependent atelectasis in both lower lobes. No mass. Musculoskeletal: Diffusely mottled appearance of the osseous structures is unchanged from the prior chest CT. As previously mentioned, metastatic disease is not excluded, however no focal hypermetabolic lesions were identified on the interval PET-CT. No acute osseous abnormality is identified. Review of the MIP images confirms the above findings. CT ABDOMEN and PELVIS FINDINGS Hepatobiliary: Tiny 2 mm hypodensity in the hepatic dome is unchanged and too small to fully characterize. Prior cholecystectomy. Common bile duct dilatation up to 12 mm proximally is unchanged. Pancreas: Unremarkable. Spleen: Unremarkable. Adrenals/Urinary Tract: Unremarkable adrenal glands. Unchanged 1.7 cm cyst in the lower pole of the right kidney. No evidence of renal calculi or  hydronephrosis. Unremarkable bladder. Stomach/Bowel: The stomach is within normal limits. No evidence of bowel obstruction or inflammation. Unremarkable appendix. Vascular/Lymphatic: Minimal aortoiliac atherosclerotic calcification. No enlarged lymph nodes. Reproductive: Uterus and bilateral adnexa are unremarkable. Other: No intraperitoneal free fluid. No abdominal wall mass or hernia. Musculoskeletal: Diffusely mottled appearance of the osseous structures as mentioned above. Moderate lower lumbar facet arthrosis. Review of the MIP images confirms the above findings. IMPRESSION: No evidence of pulmonary emboli or other acute abnormality in the chest, abdomen, or pelvis. Electronically Signed   By: Logan Bores M.D.   On: 07/03/2016 17:39    Procedures Pelvic exam Date/Time: 07/04/2016 12:23 AM Performed by: Delia Heady Authorized by: Delia Heady  Consent: Verbal consent obtained. Consent given by: patient Patient understanding: patient states understanding of the procedure being performed Patient identity confirmed: verbally with patient Patient tolerance: Patient tolerated the procedure well with no immediate complications    (including critical care time)  Medications Ordered in ED Medications  iopamidol (ISOVUE-370) 76 % injection (not administered)  iopamidol (ISOVUE-370) 76 % injection 100 mL (100 mLs Intravenous Contrast Given 07/03/16 1703)  morphine 2 MG/ML injection 4 mg (4 mg Intravenous Given 07/03/16 1723)  morphine 2 MG/ML injection 4 mg (4 mg Intravenous Given 07/03/16 2004)     Initial Impression / Assessment and Plan / ED Course  I have reviewed the triage vital signs and the nursing notes.  Pertinent labs & imaging results that were available during my care of the patient were reviewed by me and considered in my medical decision making (see chart for details).     Patient's history and symptoms concerning for metastatic spread of cancer vs. UTI vs. Pyelo vs. ACS vs.  PE vs. Arrhythmia vs. PNA vs. Vaginitis. CBC and CMP both unremarkable. Troponin negative x1. EKG showed no arrhythmia or ST changes. UA showed no evidence of UTI. Because her history makes her high risk for PE, CTA was done which showed no clot. CT abd/pelvis showed no acute GI abnormalities and normal kidneys. Both scans showed mottled appearance of osseous structures but no changes from previous scan. Patient has good strength in bilateral LE. No urinary or fecal incontinence. Patient was ambulated  in hall twice and was able to ambulate with her walker. Her SpO2 decreased to 85% while walking but increased to 90s after a few seconds. Unsure of these findings, because her SpO2 remained >95% sitting in room. Pelvic exam revealed some white discharge around cervix. Wet prep returned negative for yeast, clue cells and trich. GC/chlamydia sent. At home, patient states she only takes gabapentin for pain control when needed (chart review reveals gabapentin 100mg  bid dosage). Pain controlled somewhat here with morphine. Patient declined oral narcotic pain meds here in the ED and to take home multiple times. She states they make her "discombobulated, sick to my stomach" and she is unable to tolerate them. We told her it is hard to manage the pain from her metastatic disease with just gabapentin. However, she still refused. She asked if we could increase her gabapentin dosage instead. Considered obtaining MRI of T and L spine. However, CTs showed no changes from previous scans, and she was able to ambulate here without injury or falling. She was encouraged to follow up with PCP for further evaluation and possible imaging at that time. Given rx for gabapentin 300mg  tid. Advised to take tylenol as needed for pain as well. Strict return precautions given.  Final Clinical Impressions(s) / ED Diagnoses   Final diagnoses:  Flank pain  Vaginal discharge    New Prescriptions Discharge Medication List as of  07/03/2016  9:10 PM       Bryant Lipps Shelly Coss, PA-C 07/04/16 0048    Blanchie Dessert, MD 07/04/16 [phone]

## 2016-07-03 NOTE — ED Notes (Signed)
Patient ambulated to restroom with no assistance. Patient did ambulate with personal walker.

## 2016-07-03 NOTE — Discharge Instructions (Signed)
Begin taking gabapentin three times daily. Continue Tylenol as needed. Please follow up with PCP for further evaluation of symptoms and further imaging if needed. Return to ED for loss of bladder control, numbness, increased chest pain, coughing up blood, worsening symptoms.

## 2016-07-03 NOTE — ED Notes (Signed)
Patient transported to CT 

## 2016-07-03 NOTE — ED Triage Notes (Signed)
EMS called to house for SOB. Lungs clear SATs 99% on room air. Then the patient started complaining about left flank pain and she states she has chronic UTIs. Then the patient complains of generalized weakness and body pain. Patient has a history of breast cancer.

## 2016-07-04 LAB — GC/CHLAMYDIA PROBE AMP (~~LOC~~) NOT AT ARMC
Chlamydia: NEGATIVE
NEISSERIA GONORRHEA: NEGATIVE

## 2016-07-18 ENCOUNTER — Encounter (INDEPENDENT_AMBULATORY_CARE_PROVIDER_SITE_OTHER): Payer: Self-pay | Admitting: Physician Assistant

## 2016-07-18 ENCOUNTER — Other Ambulatory Visit: Payer: Self-pay

## 2016-07-18 ENCOUNTER — Ambulatory Visit (INDEPENDENT_AMBULATORY_CARE_PROVIDER_SITE_OTHER): Payer: Medicare Other | Admitting: Physician Assistant

## 2016-07-18 ENCOUNTER — Ambulatory Visit: Payer: Medicare Other | Attending: Internal Medicine | Admitting: *Deleted

## 2016-07-18 VITALS — BP 158/89 | HR 57 | Temp 97.9°F | Ht 67.0 in | Wt 278.8 lb

## 2016-07-18 DIAGNOSIS — R609 Edema, unspecified: Secondary | ICD-10-CM

## 2016-07-18 DIAGNOSIS — D649 Anemia, unspecified: Secondary | ICD-10-CM | POA: Diagnosis not present

## 2016-07-18 DIAGNOSIS — R0789 Other chest pain: Secondary | ICD-10-CM | POA: Diagnosis not present

## 2016-07-18 DIAGNOSIS — R0609 Other forms of dyspnea: Secondary | ICD-10-CM | POA: Diagnosis not present

## 2016-07-18 DIAGNOSIS — I10 Essential (primary) hypertension: Secondary | ICD-10-CM | POA: Diagnosis not present

## 2016-07-18 MED ORDER — CARVEDILOL 12.5 MG PO TABS: 12.5000 mg | ORAL_TABLET | Freq: Two times a day (BID) | ORAL | 1 refills | Status: DC

## 2016-07-18 MED ORDER — FUROSEMIDE 20 MG PO TABS: 20.0000 mg | ORAL_TABLET | Freq: Every day | ORAL | 3 refills | Status: DC

## 2016-07-18 MED ORDER — GABAPENTIN 300 MG PO CAPS: 300.0000 mg | ORAL_CAPSULE | Freq: Three times a day (TID) | ORAL | 3 refills | Status: DC

## 2016-07-18 NOTE — Progress Notes (Signed)
Subjective:  Patient ID: Michaela Little, female    DOB: 02/18/1961  Age: 56 y.o. MRN: [id number]  CC: SOB  HPI Michaela Little is a 56 y.o. female with a PMH of Anxiety, Breast CA, HTN, OSA, Asthma, and dyspnea presents with SOB for a "few weeks now, off and on". Mostly when walking but sometimes when seated. Has substernal chest pain, is not sure if chest pain is associated to SOB. Currently feels chest pressure and shortness of breath. Went to the ED on 07/03/16. Troponin negative. Other labs found mild anemia, and small amount of leukocytes in urine. CT chest found the following:    CTA CHEST FINDINGS  Cardiovascular: Pulmonary arterial opacification is adequate without evidence of emboli. The thoracic aorta is normal in caliber. Trace pericardial fluid is noted anteriorly.  Mediastinum/Nodes: Prior left mastectomy and left axillary dissection with left breast implant in place. No enlarged axillary, mediastinal, or hilar lymph nodes. Grossly unremarkable thyroid, trachea, and esophagus.  Lungs/Pleura: No pleural effusion or pneumothorax. Mild respiratory motion artifact with minimal dependent atelectasis in both lower lobes. No mass.  Musculoskeletal: Diffusely mottled appearance of the osseous structures is unchanged from the prior chest CT. As previously mentioned, metastatic disease is not excluded, however no focal hypermetabolic lesions were identified on the interval PET-CT. No acute osseous abnormality is identified.    CT abdomen was also done and resulted with no evidence of pulmonary emboli or other acute abnormality in the chest, abdomen, or pelvis. Denies recent travel, hospitalizations, or prolonged bed rest. Also reports swelling of the lower extremities. Patient upset at care in hospital. She questions whether or not she received proper imaging. Thinks she was desatting while walking but the nurse told her that O2 levels were normal. Does not endorse any other  symptoms.    Outpatient Medications Prior to Visit  Medication Sig Dispense Refill  . diphenhydrAMINE (BENADRYL) 25 mg capsule Take 25 mg by mouth every 6 (six) hours as needed for allergies.    [name] 100 MG capsule TAKE 1 CAPSULE BY MOUTH DAILY FOR 21 DAYS, THEN 7 DAYS REST. TAKE WHOLE WITH FOOD. 21 capsule 0  . letrozole (FEMARA) 2.5 MG tablet Take 1 tablet (2.5 mg total) by mouth daily. 90 tablet 4  . promethazine (PHENERGAN) 25 MG tablet Take 1 tablet (25 mg total) by mouth every 6 (six) hours as needed for nausea or vomiting. 120 tablet 0  . gabapentin (NEURONTIN) 300 MG capsule Take 1 capsule (300 mg total) by mouth 3 (three) times daily. 60 capsule 0  . metoprolol tartrate (LOPRESSOR) 25 MG tablet Take 1 tablet (25 mg total) by mouth 2 (two) times daily. 180 tablet 1   No facility-administered medications prior to visit.      ROS Review of Systems  Constitutional: Negative for chills, fever and malaise/fatigue.  Eyes: Negative for blurred vision.  Respiratory: Positive for shortness of breath. Negative for cough and wheezing.   Cardiovascular: Positive for leg swelling. Negative for chest pain and palpitations.  Gastrointestinal: Negative for abdominal pain and nausea.  Genitourinary: Negative for dysuria and hematuria.  Musculoskeletal: Negative for joint pain and myalgias.  Skin: Negative for rash.  Neurological: Negative for tingling and headaches.  Psychiatric/Behavioral: Negative for depression. The patient is not nervous/anxious.     Objective:  BP (!) 158/89 (BP Location: Left Arm, Patient Position: Sitting, Cuff Size: Large)   Pulse (!) 57   Temp 97.9 F (36.6 C) (Oral)   Ht 5\' 7"  (  1.702 m)   Wt 278 lb 12.8 oz (126.5 kg)   LMP 02/25/2009   BMI 43.67 kg/m   BP/Weight 07/18/2016 10/03/6267 07/01/5460  Systolic BP 703 500 938  Diastolic BP 89 88 82  Wt. (Lbs) 278.8 - -  BMI 43.67 - -      Physical Exam  Constitutional: She is oriented to person, place,  and time.  Well developed, well nourished, NAD, polite  HENT:  Head: Normocephalic and atraumatic.  Eyes: No scleral icterus.  Neck: Normal range of motion. Neck supple. No thyromegaly present.  Cardiovascular: Normal rate, regular rhythm and normal heart sounds.  Exam reveals no gallop and no friction rub.   No murmur heard. Trace pitting edema of lower extremity bilaterally  Pulmonary/Chest: Effort normal and breath sounds normal. No respiratory distress. She has no wheezes. She has no rales.  Abdominal: Soft. Bowel sounds are normal. There is no tenderness.  Musculoskeletal: She exhibits no edema.  Neurological: She is alert and oriented to person, place, and time. No cranial nerve deficit. Coordination normal.  Skin: Skin is warm and dry. No rash noted. No erythema. No pallor.  Psychiatric: She has a normal mood and affect. Her behavior is normal. Thought content normal.  Vitals reviewed.    Assessment & Plan:   1. Chest pressure - EKG 12-Lead, pt sent to [facility] due to software conflict between Epic and [employer] EKG. Supervisors aware of this issue.  2. Dyspnea on exertion - Brain natriuretic peptide - I personally walked with patient while measuring O2 saturation. After 5 minutes of walking, O2 remained stable at 99%. Patient took one break to catch her breath but I did not observe increased respiratory effort or distress. She was also able to talk without difficulty while walking.   3. Pitting edema - furosemide (LASIX) 20 MG tablet; Take 1 tablet (20 mg total) by mouth daily.  Dispense: 30 tablet; Refill: 3 - Brain natriuretic peptide  4. Hypertension, unspecified type - Stop Metoprolol 25mg  BID. HR 57 in clinic today. - Begin carvedilol (COREG) 12.5 MG tablet one tab BID. Pt allergic to HCTZ, Lisinopril, and Losartan.  - Comprehensive metabolic panel - TSH - Brain natriuretic peptide  5. Anemia, unspecified type - Hgb 11.8 g/dL on 07/03/16  - CBC with  Differential   Meds ordered this encounter  Medications  . gabapentin (NEURONTIN) 300 MG capsule    Sig: Take 1 capsule (300 mg total) by mouth 3 (three) times daily.    Dispense:  90 capsule    Refill:  3    Order Specific Question:   Supervising Provider    Answer:   Tresa Garter [id number]  . furosemide (LASIX) 20 MG tablet    Sig: Take 1 tablet (20 mg total) by mouth daily.    Dispense:  30 tablet    Refill:  3    Order Specific Question:   Supervising Provider    Answer:   Tresa Garter [id number]  . carvedilol (COREG) 12.5 MG tablet    Sig: Take 1 tablet (12.5 mg total) by mouth 2 (two) times daily with a meal.    Dispense:  90 tablet    Refill:  1    Order Specific Question:   Supervising Provider    Answer:   Tresa Garter [id number]    Follow-up: Return in about 2 weeks (around 08/01/2016) for htn, LE edema.   Clent Demark PA

## 2016-07-18 NOTE — Patient Instructions (Signed)
Shortness of Breath, Adult °Shortness of breath is when a person has trouble breathing enough air, or when a person feels like she or he is having trouble breathing in enough air. Shortness of breath could be a sign of medical problem. °Follow these instructions at home: °Pay attention to any changes in your symptoms. Take these actions to help with your condition: °· Do not smoke. Smoking is a common cause of shortness of breath. If you smoke and you need help quitting, ask your health care provider. °· Avoid things that can irritate your airways, such as: °¨ Mold. °¨ Dust. °¨ Air pollution. °¨ Chemical fumes. °¨ Things that can cause allergy symptoms (allergens), if you have allergies. °· Keep your living space clean and free of mold and dust. °· Rest as needed. Slowly return to your usual activities. °· Take over-the-counter and prescription medicines, including oxygen and inhaled medicines, only as told by your health care provider. °· Keep all follow-up visits as told by your health care provider. This is important. °Contact a health care provider if: °· Your condition does not improve as soon as expected. °· You have a hard time doing your normal activities, even after you rest. °· You have new symptoms. °Get help right away if: °· Your shortness of breath gets worse. °· You have shortness of breath when you are resting. °· You feel light-headed or you faint. °· You have a cough that is not controlled with medicines. °· You cough up blood. °· You have pain with breathing. °· You have pain in your chest, arms, shoulders, or abdomen. °· You have a fever. °· You cannot walk up stairs or exercise the way that you normally do. °This information is not intended to replace advice given to you by your health care provider. Make sure you discuss any questions you have with your health care provider. °Document Released: 12/05/2000 Document Revised: 10/01/2015 Document Reviewed: 08/18/2015 °Elsevier Interactive Patient  Education © 2017 [employer] ° °

## 2016-07-18 NOTE — Progress Notes (Signed)
I called and spoke to patient. EKG normal.

## 2016-07-18 NOTE — Progress Notes (Signed)
Pt arrived to [facility] for EKG Provider notified of results of EKG reading.

## 2016-07-19 ENCOUNTER — Telehealth (INDEPENDENT_AMBULATORY_CARE_PROVIDER_SITE_OTHER): Payer: Self-pay | Admitting: Physician Assistant

## 2016-07-19 LAB — CBC WITH DIFFERENTIAL/PLATELET
BASOS ABS: 0 10*3/uL (ref 0.0–0.2)
BASOS: 1 %
EOS (ABSOLUTE): 0.1 10*3/uL (ref 0.0–0.4)
EOS: 5 %
HEMOGLOBIN: 12 g/dL (ref 11.1–15.9)
Hematocrit: 35.5 % (ref 34.0–46.6)
IMMATURE GRANS (ABS): 0 10*3/uL (ref 0.0–0.1)
IMMATURE GRANULOCYTES: 0 %
Lymphocytes Absolute: 0.9 10*3/uL (ref 0.7–3.1)
Lymphs: 38 %
MCH: 28.8 pg (ref 26.6–33.0)
MCHC: 33.8 g/dL (ref 31.5–35.7)
MCV: 85 fL (ref 79–97)
Monocytes Absolute: 0.1 10*3/uL (ref 0.1–0.9)
Monocytes: 5 %
Neutrophils Absolute: 1.3 10*3/uL — ABNORMAL LOW (ref 1.4–7.0)
Neutrophils: 51 %
PLATELETS: 217 10*3/uL (ref 150–379)
RBC: 4.16 x10E6/uL (ref 3.77–5.28)
RDW: 17 % — ABNORMAL HIGH (ref 12.3–15.4)
WBC: 2.5 10*3/uL — AB (ref 3.4–10.8)

## 2016-07-19 LAB — COMPREHENSIVE METABOLIC PANEL
ALBUMIN: 4.2 g/dL (ref 3.5–5.5)
ALT: 10 IU/L (ref 0–32)
AST: 13 IU/L (ref 0–40)
Albumin/Globulin Ratio: 1.7 (ref 1.2–2.2)
Alkaline Phosphatase: 88 IU/L (ref 39–117)
BUN / CREAT RATIO: 13 (ref 9–23)
BUN: 10 mg/dL (ref 6–24)
Bilirubin Total: 0.7 mg/dL (ref 0.0–1.2)
CALCIUM: 9 mg/dL (ref 8.7–10.2)
CO2: 23 mmol/L (ref 18–29)
CREATININE: 0.78 mg/dL (ref 0.57–1.00)
Chloride: 103 mmol/L (ref 96–106)
GFR, EST AFRICAN AMERICAN: 97 mL/min/{1.73_m2} (ref >59)
GFR, EST NON AFRICAN AMERICAN: 84 mL/min/{1.73_m2} (ref >59)
GLUCOSE: 95 mg/dL (ref 65–99)
Globulin, Total: 2.5 g/dL (ref 1.5–4.5)
Potassium: 4.6 mmol/L (ref 3.5–5.2)
Sodium: 141 mmol/L (ref 134–144)
TOTAL PROTEIN: 6.7 g/dL (ref 6.0–8.5)

## 2016-07-19 LAB — BRAIN NATRIURETIC PEPTIDE: BNP: 13.4 pg/mL (ref 0.0–100.0)

## 2016-07-19 LAB — TSH: TSH: 1.12 u[IU]/mL (ref 0.450–4.500)

## 2016-07-19 NOTE — Telephone Encounter (Signed)
Patient called left voicmail someone called her from office,  I did not see note that we called her.  Patient stated to call her back at [phone]

## 2016-07-19 NOTE — Telephone Encounter (Signed)
Spoke with patient. Nat Christen, CMA

## 2016-07-23 ENCOUNTER — Telehealth: Payer: Self-pay | Admitting: Oncology

## 2016-07-23 NOTE — Telephone Encounter (Signed)
Spoke with patient and confirmed appointment change and she was ok with the change

## 2016-07-25 ENCOUNTER — Other Ambulatory Visit (HOSPITAL_BASED_OUTPATIENT_CLINIC_OR_DEPARTMENT_OTHER): Payer: Medicare Other

## 2016-07-25 ENCOUNTER — Ambulatory Visit (HOSPITAL_BASED_OUTPATIENT_CLINIC_OR_DEPARTMENT_OTHER): Payer: Medicare Other | Admitting: Oncology

## 2016-07-25 ENCOUNTER — Other Ambulatory Visit: Payer: Self-pay | Admitting: Oncology

## 2016-07-25 ENCOUNTER — Ambulatory Visit (HOSPITAL_BASED_OUTPATIENT_CLINIC_OR_DEPARTMENT_OTHER): Payer: Medicare Other

## 2016-07-25 VITALS — BP 136/75 | HR 70 | Temp 97.9°F | Resp 18 | Ht 67.0 in | Wt 280.5 lb

## 2016-07-25 DIAGNOSIS — I890 Lymphedema, not elsewhere classified: Secondary | ICD-10-CM | POA: Diagnosis not present

## 2016-07-25 DIAGNOSIS — C50912 Malignant neoplasm of unspecified site of left female breast: Secondary | ICD-10-CM

## 2016-07-25 DIAGNOSIS — C50812 Malignant neoplasm of overlapping sites of left female breast: Secondary | ICD-10-CM | POA: Diagnosis not present

## 2016-07-25 DIAGNOSIS — C7951 Secondary malignant neoplasm of bone: Secondary | ICD-10-CM

## 2016-07-25 DIAGNOSIS — Z170 Estrogen receptor positive status [ER+]: Secondary | ICD-10-CM | POA: Diagnosis not present

## 2016-07-25 DIAGNOSIS — I10 Essential (primary) hypertension: Secondary | ICD-10-CM | POA: Diagnosis not present

## 2016-07-25 LAB — CBC WITH DIFFERENTIAL/PLATELET
BASO%: 0.9 % (ref 0.0–2.0)
BASOS ABS: 0 10*3/uL (ref 0.0–0.1)
EOS ABS: 0.1 10*3/uL (ref 0.0–0.5)
EOS%: 4.1 % (ref 0.0–7.0)
HCT: 35.5 % (ref 34.8–46.6)
HGB: 11.8 g/dL (ref 11.6–15.9)
LYMPH%: 35.5 % (ref 14.0–49.7)
MCH: 29.2 pg (ref 25.1–34.0)
MCHC: 33.1 g/dL (ref 31.5–36.0)
MCV: 88.2 fL (ref 79.5–101.0)
MONO#: 0.2 10*3/uL (ref 0.1–0.9)
MONO%: 8.7 % (ref 0.0–14.0)
NEUT%: 50.8 % (ref 38.4–76.8)
NEUTROS ABS: 1.4 10*3/uL — AB (ref 1.5–6.5)
Platelets: 222 10*3/uL (ref 145–400)
RBC: 4.03 10*6/uL (ref 3.70–5.45)
RDW: 16.4 % — ABNORMAL HIGH (ref 11.2–14.5)
WBC: 2.7 10*3/uL — ABNORMAL LOW (ref 3.9–10.3)
lymph#: 1 10*3/uL (ref 0.9–3.3)

## 2016-07-25 MED ORDER — DENOSUMAB 120 MG/1.7ML ~~LOC~~ SOLN
120.0000 mg | Freq: Once | SUBCUTANEOUS | Status: AC
  Administered 2016-07-25: 120 mg via SUBCUTANEOUS
  Filled 2016-07-25: qty 1.7

## 2016-07-25 NOTE — Patient Instructions (Signed)
Denosumab injection  What is this medicine?  DENOSUMAB (den oh sue mab) slows bone breakdown. Prolia is used to treat osteoporosis in women after menopause and in men. Xgeva is used to prevent bone fractures and other bone problems caused by cancer bone metastases. Xgeva is also used to treat giant cell tumor of the bone.  This medicine may be used for other purposes; ask your health care provider or pharmacist if you have questions.  What should I tell my health care provider before I take this medicine?  They need to know if you have any of these conditions:  -dental disease  -eczema  -infection or history of infections  -kidney disease or on dialysis  -low blood calcium or vitamin D  -malabsorption syndrome  -scheduled to have surgery or tooth extraction  -taking medicine that contains denosumab  -thyroid or parathyroid disease  -an unusual reaction to denosumab, other medicines, foods, dyes, or preservatives  -pregnant or trying to get pregnant  -breast-feeding  How should I use this medicine?  This medicine is for injection under the skin. It is given by a health care professional in a hospital or clinic setting.  If you are getting Prolia, a special MedGuide will be given to you by the pharmacist with each prescription and refill. Be sure to read this information carefully each time.  For Prolia, talk to your pediatrician regarding the use of this medicine in children. Special care may be needed. For Xgeva, talk to your pediatrician regarding the use of this medicine in children. While this drug may be prescribed for children as young as 13 years for selected conditions, precautions do apply.  Overdosage: If you think you have taken too much of this medicine contact a poison control center or emergency room at once.  NOTE: This medicine is only for you. Do not share this medicine with others.  What if I miss a dose?  It is important not to miss your dose. Call your doctor or health care professional if you are  unable to keep an appointment.  What may interact with this medicine?  Do not take this medicine with any of the following medications:  -other medicines containing denosumab  This medicine may also interact with the following medications:  -medicines that suppress the immune system  -medicines that treat cancer  -steroid medicines like prednisone or cortisone  This list may not describe all possible interactions. Give your health care provider a list of all the medicines, herbs, non-prescription drugs, or dietary supplements you use. Also tell them if you smoke, drink alcohol, or use illegal drugs. Some items may interact with your medicine.  What should I watch for while using this medicine?  Visit your doctor or health care professional for regular checks on your progress. Your doctor or health care professional may order blood tests and other tests to see how you are doing.  Call your doctor or health care professional if you get a cold or other infection while receiving this medicine. Do not treat yourself. This medicine may decrease your body's ability to fight infection.  You should make sure you get enough calcium and vitamin D while you are taking this medicine, unless your doctor tells you not to. Discuss the foods you eat and the vitamins you take with your health care professional.  See your dentist regularly. Brush and floss your teeth as directed. Before you have any dental work done, tell your dentist you are receiving this medicine.  Do   not become pregnant while taking this medicine or for 5 months after stopping it. Women should inform their doctor if they wish to become pregnant or think they might be pregnant. There is a potential for serious side effects to an unborn child. Talk to your health care professional or pharmacist for more information.  What side effects may I notice from receiving this medicine?  Side effects that you should report to your doctor or health care professional as soon as  possible:  -allergic reactions like skin rash, itching or hives, swelling of the face, lips, or tongue  -breathing problems  -chest pain  -fast, irregular heartbeat  -feeling faint or lightheaded, falls  -fever, chills, or any other sign of infection  -muscle spasms, tightening, or twitches  -numbness or tingling  -skin blisters or bumps, or is dry, peels, or red  -slow healing or unexplained pain in the mouth or jaw  -unusual bleeding or bruising  Side effects that usually do not require medical attention (Report these to your doctor or health care professional if they continue or are bothersome.):  -muscle pain  -stomach upset, gas  This list may not describe all possible side effects. Call your doctor for medical advice about side effects. You may report side effects to FDA at 1-800-FDA-1088.  Where should I keep my medicine?  This medicine is only given in a clinic, doctor's office, or other health care setting and will not be stored at home.  NOTE: This sheet is a summary. It may not cover all possible information. If you have questions about this medicine, talk to your doctor, pharmacist, or health care provider.      2016, [employer]vier/Gold Standard. (2011-09-10 12:37:47)

## 2016-07-25 NOTE — Progress Notes (Signed)
Ok to give [place] today with CMP results from 07/18/16

## 2016-07-25 NOTE — Progress Notes (Signed)
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 She continues on letrozole, with good tolerance.Hot flashes and vaginal dryness are not a major issue. She never developed the arthralgias or myalgias that many patients can experience on this medication. She obtains it at a good price.  She is also on palbociclib, 100 mg 21 days on 7 days off. She is currently on the "off week". She does not have significant fatigue, rash, or other side effects that she is aware from this medication.      finally she receives denosumab/Xgeva every 28 days. She has had no problems from this and particularly no issues with her teeth and no bony aches or pains.   REVIEW OF SYSTEMS: Michaela Little presented to the emergency room 07/03/2016 complaining of shortness of breath. As part of the workup she had a CT angiogram of the chest and a CT of the abdomen and pelvis. There was no evidence of measurable disease. There continues to be an mottled appearance to her bones, but no focal lesions. She was discharged and saw her primary care physician recently. She was changed to Coreg and Lasix for blood pressure control and that is working much better for her. Aside from these issues a detailed review of systems today was stable   BREAST CANCER HISTORY: From the original intake note:  Michaela Little is a [place] woman who moved to [place] in 2013 with a history of breast cancer, and establishied herself with our practice.  The patient had left breast and left axillary lymph node biopsy  performed March of 2010, both positive (I do not have the pathology report at this point). She was treated neo-adjuvantly at [facility] with (a) paclitaxel weekly x12 and (b) doxorubicin/cyclophosphamide in dose dense fashion x4, both given with tifiparnib.   She then proceeded to left modified radical mastectomy 12/23/2008, the final pathology showing a 4 cm residual invasive lobular carcinoma, involving 5/22 lymph nodes sampled. The tumor was estrogen and progesterone receptor positive, HER-2 negative.   Postoperatively she received 50 gray of radiation completed January of 2011, including the left supraclavicular lymph node basin. She was then started on tamoxifen, but developed some uterine lining thickening and was switched to anastrozole in May 2012. Her subsequent history is as detailed below   PAST MEDICAL HISTORY: Past Medical History:  Diagnosis Date  . Anxiety   . Arthritis    hands, knees, hips  . Asthma   . Cancer ([place])   . Cervical stenosis (uterine cervix)   . Dyspnea on exertion   . GERD (gastroesophageal reflux disease)   . Headache(784.0)   . History of breast cancer    2010--  LEFT  s/p  mastectomy (in [place]) AND CHEMORADIATION--  NO RECURRENCE  . History of cervical dysplasia   . History of TB skin testing    AS TEEN--  [place] W/ MEDS  . Hyperlipidemia   . Hypertension   . OSA (obstructive sleep apnea) moderate osa per study  09/2010   CPAP  , NOT USING ON REGULAR BASIS  . Pelvic pain in female   .  Positive H. pylori test    08-05-2013  . Refusal of blood transfusions as patient is Jehovah's Witness   . Seasonal allergies   . Uterine fibroid   . Wears glasses     PAST SURGICAL HISTORY: Past Surgical History:  Procedure Laterality Date  . CARDIOVASCULAR STRESS TEST  10-29-2012   low risk perfusion study/  no significant reversibity/ ef 66%/  normal wall motion  . CERVICAL CONIZATION W/BX  2012   in  [place] N/A 05/14/2012   Procedure: LAPAROSCOPIC  CHOLECYSTECTOMY;  Surgeon: Ralene Ok, MD;  Location: [facility];  Service: General;  Laterality: N/A;  . COLONOSCOPY  2011   normal per patient - [place]  . DILATION AND CURETTAGE OF UTERUS  10/15/2011   Procedure: DILATATION AND CURETTAGE;  Surgeon: Melina Schools, MD;  Location: [facility];  Service: Gynecology;  Laterality: N/A;  Conization &  endocervical curettings  . EXAMINATION UNDER ANESTHESIA N/A 08/20/2013   Procedure: EXAM UNDER ANESTHESIA;  Surgeon: Margarette Asal, MD;  Location: [facility];  Service: Gynecology;  Laterality: N/A;  . [place]   right  . LAPAROSCOPIC ASSISTED VAGINAL HYSTERECTOMY N/A 10/26/2014   Procedure: HYSTERECTOMY ABDOMINAL ;  Surgeon: Molli Posey, MD;  Location: [facility];  Service: Gynecology;  Laterality: N/A;  . MASTECTOMY Left 11/2008  in [place]   . SALPINGOOPHORECTOMY Bilateral 10/26/2014   Procedure: BILATERAL SALPINGO OOPHORECTOMY;  Surgeon: Molli Posey, MD;  Location: [facility];  Service: Gynecology;  Laterality: Bilateral;  . TRANSTHORACIC ECHOCARDIOGRAM  10-29-2012   mild lvh/  ef 60-65%/  grade II diastolic dysfunction/  trivial mr  &  tr    FAMILY HISTORY Family History  Problem Relation Age of Onset  . Breast cancer Mother   . Colon cancer Mother   . Hypotension Mother   . Asthma Mother   . Diabetes type II Mother   . Arthritis Mother   . Clotting disorder Mother   . Cancer Mother     breast/colon  . Mental illness Brother   . Heart disease Brother   . Cerebral palsy Daughter   . Emphysema Brother     never smoker  . Colon cancer Maternal Aunt   . Cancer Maternal Aunt     colon  . Colon cancer Maternal Uncle   . Cancer Maternal Uncle     colon  . Esophageal cancer Neg Hx   . Rectal cancer Neg Hx   . Stomach cancer Neg Hx   . Thyroid disease Neg Hx    Gynecologic history: She is GX P5. At first pregnancy to term age 72. Menarche age 45. Last menstrual period when she had her chemotherapy.    Social History: She is disabled secondary to arthritis. She moved from [place] to [place] for "peace of mind". Of her 5 children only one daughter is living in [place], [place]. One daughter in [place] is incarcerated and another one is "in a lot of stress". The patient lives alone. She mostly reads and does computer games on her phone during the day. She has 20 grandchildren.    ADVANCED DIRECTIVES: She does not have a healthcare power of attorney.    HEALTH MAINTENANCE: Social History  Substance Use Topics  . Smoking status: Former Smoker    Packs/day: 0.30    Years: 10.00    Types: Cigarettes    Quit date: 03/26/1988  . Smokeless tobacco: Never  Used  . Alcohol use No     Allergies  Allergen Reactions  . Lisinopril-Hydrochlorothiazide Itching  . Adhesive [Tape] Itching and Other (See Comments)    Redness  . Calcium Hives    Fruit flavored Tums (cannot take)   . Calcium Carbonate Antacid Hives    Fruit flavored Tums   . Hctz [Hydrochlorothiazide] Itching  . Latex Itching  . Lisinopril Itching  . Losartan Potassium Other (See Comments)    Makes her feel "bad"     Current Outpatient Prescriptions  Medication Sig Dispense Refill  . carvedilol (COREG) 12.5 MG tablet Take 1 tablet (12.5 mg total) by mouth 2 (two) times daily with a meal. 90 tablet 1  . diphenhydrAMINE (BENADRYL) 25 mg capsule Take 25 mg by mouth every 6 (six) hours as needed for allergies.    . furosemide (LASIX) 20 MG tablet Take 1 tablet (20 mg total) by mouth daily. 30 tablet 3  . gabapentin (NEURONTIN) 300 MG capsule Take 1 capsule (300 mg total) by mouth 3 (three) times daily. 90 capsule 3  . IBRANCE 100 MG capsule TAKE 1 CAPSULE BY MOUTH DAILY FOR 21 DAYS, THEN 7 DAYS REST. TAKE WHOLE WITH FOOD. 21 capsule 0  . letrozole (FEMARA) 2.5 MG tablet Take 1 tablet (2.5 mg total) by mouth daily. 90 tablet 4  . promethazine (PHENERGAN) 25 MG tablet Take 1 tablet (25 mg total) by mouth every 6 (six)  hours as needed for nausea or vomiting. 120 tablet 0   No current facility-administered medications for this visit.     OBJECTIVE: Middle-aged [place] American woman In no acute distress  Vitals:   07/25/16 1255  BP: 136/75  Pulse: 70  Resp: 18  Temp: 97.9 F (36.6 C)     Body mass index is 43.93 kg/m.    ECOG FS: 1 Filed Weights   07/25/16 1255  Weight: 280 lb 8 oz (127.2 kg)   Sclerae unicteric, EOMs intact Oropharynx clear and moist No cervical or supraclavicular adenopathy Lungs no rales or rhonchi Heart regular rate and rhythm Abd soft, nontender, positive bowel sounds MSK no focal spinal tenderness, no upper extremity lymphedema Neuro: nonfocal, well oriented, appropriate affect Breasts: The right breast is unremarkable. The left breast as undergone mastectomy with expander placement but the surgery has never been finalized. There is no evidence of local recurrence. Both axillae are benign.  LAB RESULTS: Lab Results  Component Value Date   WBC 2.5 (LL) 07/18/2016   NEUTROABS 1.3 (L) 07/18/2016   HGB 11.8 (L) 07/03/2016   HCT 35.5 07/18/2016   MCV 85 07/18/2016   PLT 217 07/18/2016      Chemistry      Component Value Date/Time   NA 141 07/18/2016 1032   NA 141 06/27/2016 1131   K 4.6 07/18/2016 1032   K 4.1 06/27/2016 1131   CL 103 07/18/2016 1032   CL 105 07/07/2012 1327   CO2 23 07/18/2016 1032   CO2 23 06/27/2016 1131   BUN 10 07/18/2016 1032   BUN 13.9 06/27/2016 1131   CREATININE 0.78 07/18/2016 1032   CREATININE 0.8 06/27/2016 1131      Component Value Date/Time   CALCIUM 9.0 07/18/2016 1032   CALCIUM 9.5 06/27/2016 1131   ALKPHOS 88 07/18/2016 1032   ALKPHOS 95 06/27/2016 1131   AST 13 07/18/2016 1032   AST 14 06/27/2016 1131   ALT 10 07/18/2016 1032   ALT 8 06/27/2016 1131   BILITOT 0.7 07/18/2016 1032  BILITOT 0.85 06/27/2016 1131       Lab Results  Component Value Date   LABCA2 19 01/17/2012     STUDIES: Ct Angio Chest Pe W  And/or Wo Contrast  Result Date: 07/03/2016 CLINICAL DATA:  Shortness of breath. Left flank pain. Generalized weakness. History of breast cancer. EXAM: CT ANGIOGRAPHY CHEST CT ABDOMEN AND PELVIS WITH CONTRAST TECHNIQUE: Multidetector CT imaging of the chest was performed using the standard protocol during bolus administration of intravenous contrast. Multiplanar CT image reconstructions and MIPs were obtained to evaluate the vascular anatomy. Multidetector CT imaging of the abdomen and pelvis was performed using the standard protocol during bolus administration of intravenous contrast. CONTRAST:  100 mL Isovue 370 COMPARISON:  PET-CT 03/01/2016. CT abdomen and pelvis 12/02/2015. Chest CT 11/07/2015. FINDINGS: CTA CHEST FINDINGS Cardiovascular: Pulmonary arterial opacification is adequate without evidence of emboli. The thoracic aorta is normal in caliber. Trace pericardial fluid is noted anteriorly. Mediastinum/Nodes: Prior left mastectomy and left axillary dissection with left breast implant in place. No enlarged axillary, mediastinal, or hilar lymph nodes. Grossly unremarkable thyroid, trachea, and esophagus. Lungs/Pleura: No pleural effusion or pneumothorax. Mild respiratory motion artifact with minimal dependent atelectasis in both lower lobes. No mass. Musculoskeletal: Diffusely mottled appearance of the osseous structures is unchanged from the prior chest CT. As previously mentioned, metastatic disease is not excluded, however no focal hypermetabolic lesions were identified on the interval PET-CT. No acute osseous abnormality is identified. Review of the MIP images confirms the above findings. CT ABDOMEN and PELVIS FINDINGS Hepatobiliary: Tiny 2 mm hypodensity in the hepatic dome is unchanged and too small to fully characterize. Prior cholecystectomy. Common bile duct dilatation up to 12 mm proximally is unchanged. Pancreas: Unremarkable. Spleen: Unremarkable. Adrenals/Urinary Tract: Unremarkable adrenal  glands. Unchanged 1.7 cm cyst in the lower pole of the right kidney. No evidence of renal calculi or hydronephrosis. Unremarkable bladder. Stomach/Bowel: The stomach is within normal limits. No evidence of bowel obstruction or inflammation. Unremarkable appendix. Vascular/Lymphatic: Minimal aortoiliac atherosclerotic calcification. No enlarged lymph nodes. Reproductive: Uterus and bilateral adnexa are unremarkable. Other: No intraperitoneal free fluid. No abdominal wall mass or hernia. Musculoskeletal: Diffusely mottled appearance of the osseous structures as mentioned above. Moderate lower lumbar facet arthrosis. Review of the MIP images confirms the above findings. IMPRESSION: No evidence of pulmonary emboli or other acute abnormality in the chest, abdomen, or pelvis. Electronically Signed   By: Logan Bores M.D.   On: 07/03/2016 17:39   Ct Abdomen Pelvis W Contrast  Result Date: 07/03/2016 CLINICAL DATA:  Shortness of breath. Left flank pain. Generalized weakness. History of breast cancer. EXAM: CT ANGIOGRAPHY CHEST CT ABDOMEN AND PELVIS WITH CONTRAST TECHNIQUE: Multidetector CT imaging of the chest was performed using the standard protocol during bolus administration of intravenous contrast. Multiplanar CT image reconstructions and MIPs were obtained to evaluate the vascular anatomy. Multidetector CT imaging of the abdomen and pelvis was performed using the standard protocol during bolus administration of intravenous contrast. CONTRAST:  100 mL Isovue 370 COMPARISON:  PET-CT 03/01/2016. CT abdomen and pelvis 12/02/2015. Chest CT 11/07/2015. FINDINGS: CTA CHEST FINDINGS Cardiovascular: Pulmonary arterial opacification is adequate without evidence of emboli. The thoracic aorta is normal in caliber. Trace pericardial fluid is noted anteriorly. Mediastinum/Nodes: Prior left mastectomy and left axillary dissection with left breast implant in place. No enlarged axillary, mediastinal, or hilar lymph nodes. Grossly  unremarkable thyroid, trachea, and esophagus. Lungs/Pleura: No pleural effusion or pneumothorax. Mild respiratory motion artifact with minimal dependent atelectasis in both  lower lobes. No mass. Musculoskeletal: Diffusely mottled appearance of the osseous structures is unchanged from the prior chest CT. As previously mentioned, metastatic disease is not excluded, however no focal hypermetabolic lesions were identified on the interval PET-CT. No acute osseous abnormality is identified. Review of the MIP images confirms the above findings. CT ABDOMEN and PELVIS FINDINGS Hepatobiliary: Tiny 2 mm hypodensity in the hepatic dome is unchanged and too small to fully characterize. Prior cholecystectomy. Common bile duct dilatation up to 12 mm proximally is unchanged. Pancreas: Unremarkable. Spleen: Unremarkable. Adrenals/Urinary Tract: Unremarkable adrenal glands. Unchanged 1.7 cm cyst in the lower pole of the right kidney. No evidence of renal calculi or hydronephrosis. Unremarkable bladder. Stomach/Bowel: The stomach is within normal limits. No evidence of bowel obstruction or inflammation. Unremarkable appendix. Vascular/Lymphatic: Minimal aortoiliac atherosclerotic calcification. No enlarged lymph nodes. Reproductive: Uterus and bilateral adnexa are unremarkable. Other: No intraperitoneal free fluid. No abdominal wall mass or hernia. Musculoskeletal: Diffusely mottled appearance of the osseous structures as mentioned above. Moderate lower lumbar facet arthrosis. Review of the MIP images confirms the above findings. IMPRESSION: No evidence of pulmonary emboli or other acute abnormality in the chest, abdomen, or pelvis. Electronically Signed   By: Logan Bores M.D.   On: 07/03/2016 17:39     IMPRESSION: 56 y.o.  [place] woman relocated here from [place],    (1) breast cancer of left breast overlapping sites diagnosed March of 2010 and treated neo-adjuvantly at [facility] with (a) paclitaxel weekly x12 and (b)  doxorubicin/ cyclophosphamide in dose dense fashion x4, both given with tifiparnib.   (2) definitive left modified radical mastectomy September of 2010 for a T2 N2 or stage IIIA invasive lobular breast cancer, estrogen and progesterone receptor positive, HER-2 negative,   (3)  post mastectomy radiation completed January of 2011,  (4) briefly on tamoxifen, discontinued it because of uterine lining concerns.  On anastrozole since May 2012 with good tolerance. Held between November and December 2013 due to GI issues, unrelated to cancer or its treatment.  Discontinued by the patient summer of 2016  (5)  Other problems include left upper extremity lymphedema, borderline concern regarding genetic family history, history of cervical dysplasia, and history of dense breasts.  METASTATIC DISEASE: (6) iliac bone biopsy (laterality?) 05/26/2015 showed metastatic carcinoma consistent with a breast primary, estrogen receptor positive, progesterone receptor and HER-2 negative  (a) extensive staging studies including CT scans of the chest abdomen and pelvis, MRI of the brain, and a PET scan February-March 2017 in [place] showed no evidence of visceral disease   (7) palliative radiation to the right 8th rib lesion (single 8 gray dose) and right hip (20 gray in 5 fractions) given 06/24/2015 through [facility]/27/[facility]  (8) letrozole and palbociclib started March 2017  (a) palbociclib dose decreased to 100 mg daily (21/7) because of cytopenias  (b) repeat chest CT and bone scan 11/07/2015 shows stable bone only metastatic disease  (c) scan 03/01/2016 shows no visceral disease and no bony metastatic activity  (d) CT angiogram of the chest and CT abdomen pelvis 07/17/2016 show no measurable disease  (9) enoxaparin started February 2017 in [facility]  for reading of "central pulmonary embolus" on CT scan of the chest 04/19/2015  (a) CT report amended 10/18/2015 show no pulmonary embolus; enoxaparin  discontinued  (10) denosumab/Xgeva started 11/16/2015, repeated monthly  PLAN: I spent approximately 30 minutes with Michaela Little with most of that time spent discussing her metastatic disease. Michaela Little was just restaged for evaluation  of the shortness of breath she presented to the emergency room with, and there is no evidence of measurable disease at present. This is very favorable.   She understands this is not a cure and she needs to continue the current treatment until there is evidence of disease progression or until significant side effects develop.  She is tolerating the letrozole and palbociclib/Ibrance without significant side effects.  We are beginning to see some drop in her neutrophil count. We may need to drop the palbociclib dose to 75 mg eventually but for now we are continuing as it is.  I think the changes they have made to her blood pressure medication including adding the Lasix have been helpful to her. I encouraged her to be compliant with those.  Otherwise the plan is to continue the Xgeva on an every four-week basis, with lab checks, and she will return to see me again in 24 weeks.  She would like to consider completing the surgery, with either expansile removal or eventual implant placement. I will refer her to plastics for discussion of this.   She knows to call for any problems that may develop before the next with visit here.       Leopoldo Mazzie C 07/25/2016

## 2016-08-01 ENCOUNTER — Encounter (INDEPENDENT_AMBULATORY_CARE_PROVIDER_SITE_OTHER): Payer: Self-pay | Admitting: Physician Assistant

## 2016-08-01 ENCOUNTER — Ambulatory Visit (INDEPENDENT_AMBULATORY_CARE_PROVIDER_SITE_OTHER): Payer: Medicare Other | Admitting: Physician Assistant

## 2016-08-01 VITALS — BP 106/72 | HR 81 | Temp 98.2°F | Wt 276.0 lb

## 2016-08-01 DIAGNOSIS — R609 Edema, unspecified: Secondary | ICD-10-CM | POA: Diagnosis not present

## 2016-08-01 DIAGNOSIS — B9689 Other specified bacterial agents as the cause of diseases classified elsewhere: Secondary | ICD-10-CM

## 2016-08-01 DIAGNOSIS — N3000 Acute cystitis without hematuria: Secondary | ICD-10-CM | POA: Diagnosis not present

## 2016-08-01 DIAGNOSIS — N952 Postmenopausal atrophic vaginitis: Secondary | ICD-10-CM

## 2016-08-01 DIAGNOSIS — R0609 Other forms of dyspnea: Secondary | ICD-10-CM

## 2016-08-01 DIAGNOSIS — R300 Dysuria: Secondary | ICD-10-CM

## 2016-08-01 DIAGNOSIS — R109 Unspecified abdominal pain: Secondary | ICD-10-CM

## 2016-08-01 DIAGNOSIS — N760 Acute vaginitis: Secondary | ICD-10-CM | POA: Diagnosis not present

## 2016-08-01 DIAGNOSIS — R0789 Other chest pain: Secondary | ICD-10-CM | POA: Diagnosis not present

## 2016-08-01 LAB — POCT URINALYSIS DIPSTICK
BILIRUBIN UA: NEGATIVE
Glucose, UA: NEGATIVE
Ketones, UA: NEGATIVE
NITRITE UA: NEGATIVE
PH UA: 5 (ref 5.0–8.0)
Protein, UA: NEGATIVE
RBC UA: NEGATIVE
Spec Grav, UA: 1.025 (ref 1.010–1.025)
UROBILINOGEN UA: 1 U/dL

## 2016-08-01 MED ORDER — ALBUTEROL SULFATE HFA 108 (90 BASE) MCG/ACT IN AERS
2.0000 | INHALATION_SPRAY | RESPIRATORY_TRACT | 5 refills | Status: DC | PRN
Start: 2016-08-01 — End: 2017-01-02

## 2016-08-01 MED ORDER — METRONIDAZOLE 500 MG PO TABS: 500.0000 mg | ORAL_TABLET | Freq: Two times a day (BID) | ORAL | 0 refills | Status: AC

## 2016-08-01 MED ORDER — FLUCONAZOLE 150 MG PO TABS: 150.0000 mg | ORAL_TABLET | Freq: Once | ORAL | 0 refills | Status: AC

## 2016-08-01 MED ORDER — CETIRIZINE HCL 10 MG PO TABS: 10.0000 mg | ORAL_TABLET | Freq: Every day | ORAL | 11 refills | Status: DC

## 2016-08-01 MED ORDER — CIPROFLOXACIN HCL 500 MG PO TABS: 500.0000 mg | ORAL_TABLET | Freq: Two times a day (BID) | ORAL | 0 refills | Status: AC

## 2016-08-01 NOTE — Progress Notes (Signed)
Subjective:  Patient ID: Michaela Little, female    DOB: June 18, 1960  Age: 56 y.o. MRN: [id number]  CC: f/u HTN, LE edema  HPI Michaela Little is a 56 y.o. female with multiple comorbidities presents for f/u of HTN and LE edema. BP has been well controlled. LE edema has resolved but states that she had one day of swelling recently.Recent EKG revealed sinus bradycardia. Was on Metoprolol resulting in HR of 52 and BP of 158/89. Now on Carvedilol and HR 81 with BP of 106/72.  Has increased her Lasix to 40 mg qday and doing well.  In regards to dyspnea on exertion with associated chest pressure, says she is doing better but sometimes feels short of breath even when sitting. No longer has an inhaler which used to help with symptoms. Recent CT on 07/03/16 revealed: No pleural effusion or pneumothorax. Mild respiratory motion artifact with minimal dependent atelectasis in both lower lobes. No mass. Has never been to a pulmonologist.     Patient would like to address right flank pain and dysuria. Reports having a UTI approximately every 4 months. She is upset that recent CT abdomen and pelvis did not find anything when she is feeling pain. Does not endorse vaginal discharge, genital lesions, urinary frequency, or hematuria. Has a hx of hysterectomy and is upset that her CT was reported as having a normal uterus. Does not endorse any other symptoms.      Outpatient Medications Prior to Visit  Medication Sig Dispense Refill  . carvedilol (COREG) 12.5 MG tablet Take 1 tablet (12.5 mg total) by mouth 2 (two) times daily with a meal. 90 tablet 1  . diphenhydrAMINE (BENADRYL) 25 mg capsule Take 25 mg by mouth every 6 (six) hours as needed for allergies.    . furosemide (LASIX) 20 MG tablet Take 1 tablet (20 mg total) by mouth daily. 30 tablet 3  . gabapentin (NEURONTIN) 300 MG capsule Take 1 capsule (300 mg total) by mouth 3 (three) times daily. 90 capsule 3  . IBRANCE 100 MG capsule TAKE 1 CAPSULE BY MOUTH DAILY  FOR 21 DAYS, THEN 7 DAYS REST. TAKE WHOLE WITH FOOD. 21 capsule 0  . letrozole (FEMARA) 2.5 MG tablet Take 1 tablet (2.5 mg total) by mouth daily. 90 tablet 4  . promethazine (PHENERGAN) 25 MG tablet Take 1 tablet (25 mg total) by mouth every 6 (six) hours as needed for nausea or vomiting. 120 tablet 0   No facility-administered medications prior to visit.      ROS Review of Systems  Constitutional: Negative for chills, fever and malaise/fatigue.  Eyes: Negative for blurred vision.  Respiratory: Positive for shortness of breath. Negative for cough.   Cardiovascular: Negative for chest pain, palpitations and leg swelling.  Gastrointestinal: Negative for abdominal pain and nausea.  Genitourinary: Positive for dysuria and flank pain. Negative for frequency, hematuria and urgency.  Musculoskeletal: Negative for joint pain and myalgias.  Skin: Negative for rash.  Neurological: Negative for tingling and headaches.  Psychiatric/Behavioral: Negative for depression. The patient is not nervous/anxious.     Objective:  BP 106/72 (BP Location: Right Arm, Patient Position: Sitting, Cuff Size: Large)   Pulse 81   Temp 98.2 F (36.8 C) (Oral)   Wt 276 lb (125.2 kg)   LMP 02/25/2009   SpO2 95%   BMI 43.23 kg/m   BP/Weight 08/01/2016 07/25/2016 12/09/3844  Systolic BP 659 935 701  Diastolic BP 72 75 89  Wt. (Lbs) 276 280.5 278.8  BMI 43.23 43.93 43.67      Physical Exam  Constitutional: She is oriented to person, place, and time.  Well developed, Obese, NAD, polite  HENT:  Head: Normocephalic and atraumatic.  Eyes: No scleral icterus.  Neck: Normal range of motion.  Cardiovascular: Normal rate, regular rhythm and normal heart sounds.   Pulmonary/Chest: Effort normal and breath sounds normal. No respiratory distress. She has no wheezes. She has no rales. She exhibits no tenderness.  Somewhat reduced air movement bilaterally and diffusely  Genitourinary:  Genitourinary Comments: Pale  vaginal walls, loss of rugae. Thickened vaginal secretion white in color and with fishy odor.  Musculoskeletal:  No LE edema bilaterally  Neurological: She is alert and oriented to person, place, and time. No cranial nerve deficit. Coordination normal.  Skin: Skin is warm and dry. No rash noted. No erythema. No pallor.  Psychiatric: She has a normal mood and affect. Her behavior is normal. Thought content normal.  Vitals reviewed.    Assessment & Plan:   1. Chest pressure - Likely pulmonary in nature. Recent EKG unremarkable.   2. Dyspnea on exertion - Ambulatory referral to Pulmonology - Begin albuterol (PROVENTIL HFA;VENTOLIN HFA) 108 (90 Base) MCG/ACT inhaler; Inhale 2 puffs into the lungs every 4 (four) hours as needed for wheezing or shortness of breath.  Dispense: 1 Inhaler; Refill: 5 -  Begin cetirizine (ZYRTEC) 10 MG tablet; Take 1 tablet (10 mg total) by mouth daily.  Dispense: 30 tablet; Refill: 11  3. Acute cystitis without hematuria - Urine culture - Begin ciprofloxacin (CIPRO) 500 MG tablet; Take 1 tablet (500 mg total) by mouth 2 (two) times daily.  Dispense: 10 tablet; Refill: 0  4. Right flank pain - Urinalysis Dipstick positive for moderate Leukocytes  5. Dysuria - Urinalysis Dipstick positive for moderate Leukocytes  6. Pitting edema - Resolved with Lasix 40mg  qday. Pt is supplementing K daily.   7. Atrophic vaginitis - Ambulatory referral to Gynecology - Likely a contributor to her UTIs.  8. Bacterial vaginosis - Begin metroNIDAZOLE (FLAGYL) 500 MG tablet; Take 1 tablet (500 mg total) by mouth 2 (two) times daily.  Dispense: 14 tablet; Refill: 0 - Begin fluconazole (DIFLUCAN) 150 MG tablet; Take 1 tablet (150 mg total) by mouth once.  Dispense: 1 tablet; Refill: 0   Meds ordered this encounter  Medications  . albuterol (PROVENTIL HFA;VENTOLIN HFA) 108 (90 Base) MCG/ACT inhaler    Sig: Inhale 2 puffs into the lungs every 4 (four) hours as needed for  wheezing or shortness of breath.    Dispense:  1 Inhaler    Refill:  5    Order Specific Question:   Supervising Provider    Answer:   Tresa Garter [id number]  . cetirizine (ZYRTEC) 10 MG tablet    Sig: Take 1 tablet (10 mg total) by mouth daily.    Dispense:  30 tablet    Refill:  11    Order Specific Question:   Supervising Provider    Answer:   Tresa Garter [id number]  . ciprofloxacin (CIPRO) 500 MG tablet    Sig: Take 1 tablet (500 mg total) by mouth 2 (two) times daily.    Dispense:  10 tablet    Refill:  0    Order Specific Question:   Supervising Provider    Answer:   Tresa Garter [id number]  . metroNIDAZOLE (FLAGYL) 500 MG tablet    Sig: Take 1 tablet (500 mg total) by mouth 2 (two) times  daily.    Dispense:  14 tablet    Refill:  0    Order Specific Question:   Supervising Provider    Answer:   Tresa Garter [id number]  . fluconazole (DIFLUCAN) 150 MG tablet    Sig: Take 1 tablet (150 mg total) by mouth once.    Dispense:  1 tablet    Refill:  0    Order Specific Question:   Supervising Provider    Answer:   Tresa Garter [id number]    Follow-up: Return in about 4 months (around 12/02/2016) for HTN.   Clent Demark PA

## 2016-08-01 NOTE — Patient Instructions (Signed)
Atrophic Vaginitis Atrophic vaginitis is when the tissues that line the vagina become dry and thin. This is caused by a drop in estrogen. Estrogen helps:  To keep the vagina moist.  To make a clear fluid that helps:  To lubricate the vagina for sex.  To protect the vagina from infection. If the lining of the vagina is dry and thin, it may:  Make sex painful. It may also cause bleeding.  Cause a feeling of:  Burning.  Irritation.  Itchiness.  Make an exam of your vagina painful. It may also cause bleeding.  Make you lose interest in sex.  Cause a burning feeling when you pee.  Make your vaginal fluid (discharge) brown or yellow. For some women, there are no symptoms. This condition is most common in women who do not get their regular menstrual periods anymore (menopause). This often starts when a woman is 69-86 years old. Follow these instructions at home:  Take medicines only as told by your doctor. Do not use any herbal or alternative medicines unless your doctor says it is okay.  Use over-the-counter products for dryness only as told by your doctor. These include:  Creams.  Lubricants.  Moisturizers.  Do not douche.  Do not use products that can make your vagina dry. These include:  Scented feminine sprays.  Scented tampons.  Scented soaps.  If it hurts to have sex, tell your sexual partner. Contact a doctor if:  Your discharge looks different than normal.  Your vagina has an unusual smell.  You have new symptoms.  Your symptoms do not get better with treatment.  Your symptoms get worse. This information is not intended to replace advice given to you by your health care provider. Make sure you discuss any questions you have with your health care provider. Document Released: 08/29/2007 Document Revised: 08/18/2015 Document Reviewed: 03/03/2014 Elsevier Interactive Patient Education  2017 [employer].

## 2016-08-03 LAB — URINE CULTURE

## 2016-08-09 ENCOUNTER — Institutional Professional Consult (permissible substitution): Payer: Medicare Other | Admitting: Internal Medicine

## 2016-08-10 ENCOUNTER — Ambulatory Visit: Payer: Medicare Other | Admitting: Internal Medicine

## 2016-08-15 ENCOUNTER — Encounter: Payer: Self-pay | Admitting: Obstetrics and Gynecology

## 2016-08-22 ENCOUNTER — Other Ambulatory Visit (HOSPITAL_BASED_OUTPATIENT_CLINIC_OR_DEPARTMENT_OTHER): Payer: Medicare Other

## 2016-08-22 ENCOUNTER — Ambulatory Visit (HOSPITAL_BASED_OUTPATIENT_CLINIC_OR_DEPARTMENT_OTHER): Payer: Medicare Other

## 2016-08-22 ENCOUNTER — Telehealth: Payer: Self-pay | Admitting: *Deleted

## 2016-08-22 VITALS — BP 130/78 | HR 69 | Temp 97.5°F | Resp 20

## 2016-08-22 DIAGNOSIS — C7951 Secondary malignant neoplasm of bone: Secondary | ICD-10-CM

## 2016-08-22 DIAGNOSIS — Z170 Estrogen receptor positive status [ER+]: Secondary | ICD-10-CM

## 2016-08-22 DIAGNOSIS — C50812 Malignant neoplasm of overlapping sites of left female breast: Secondary | ICD-10-CM

## 2016-08-22 LAB — CBC WITH DIFFERENTIAL/PLATELET
BASO%: 0.7 % (ref 0.0–2.0)
BASOS ABS: 0 10*3/uL (ref 0.0–0.1)
EOS ABS: 0.1 10*3/uL (ref 0.0–0.5)
EOS%: 3.7 % (ref 0.0–7.0)
HEMATOCRIT: 35.6 % (ref 34.8–46.6)
HGB: 11.9 g/dL (ref 11.6–15.9)
LYMPH#: 1.1 10*3/uL (ref 0.9–3.3)
LYMPH%: 39.3 % (ref 14.0–49.7)
MCH: 29.8 pg (ref 25.1–34.0)
MCHC: 33.4 g/dL (ref 31.5–36.0)
MCV: 89 fL (ref 79.5–101.0)
MONO#: 0.1 10*3/uL (ref 0.1–0.9)
MONO%: 4.8 % (ref 0.0–14.0)
NEUT#: 1.4 10*3/uL — ABNORMAL LOW (ref 1.5–6.5)
NEUT%: 51.5 % (ref 38.4–76.8)
PLATELETS: 175 10*3/uL (ref 145–400)
RBC: 4 10*6/uL (ref 3.70–5.45)
RDW: 15.2 % — ABNORMAL HIGH (ref 11.2–14.5)
WBC: 2.7 10*3/uL — ABNORMAL LOW (ref 3.9–10.3)

## 2016-08-22 MED ORDER — DENOSUMAB 120 MG/1.7ML ~~LOC~~ SOLN
120.0000 mg | Freq: Once | SUBCUTANEOUS | Status: AC
  Administered 2016-08-22: 120 mg via SUBCUTANEOUS
  Filled 2016-08-22: qty 1.7

## 2016-08-22 NOTE — Telephone Encounter (Signed)
Per MD may proceed with treatment with out CMET

## 2016-08-22 NOTE — Patient Instructions (Signed)
Denosumab injection  What is this medicine?  DENOSUMAB (den oh sue mab) slows bone breakdown. Prolia is used to treat osteoporosis in women after menopause and in men. Xgeva is used to prevent bone fractures and other bone problems caused by cancer bone metastases. Xgeva is also used to treat giant cell tumor of the bone.  This medicine may be used for other purposes; ask your health care provider or pharmacist if you have questions.  What should I tell my health care provider before I take this medicine?  They need to know if you have any of these conditions:  -dental disease  -eczema  -infection or history of infections  -kidney disease or on dialysis  -low blood calcium or vitamin D  -malabsorption syndrome  -scheduled to have surgery or tooth extraction  -taking medicine that contains denosumab  -thyroid or parathyroid disease  -an unusual reaction to denosumab, other medicines, foods, dyes, or preservatives  -pregnant or trying to get pregnant  -breast-feeding  How should I use this medicine?  This medicine is for injection under the skin. It is given by a health care professional in a hospital or clinic setting.  If you are getting Prolia, a special MedGuide will be given to you by the pharmacist with each prescription and refill. Be sure to read this information carefully each time.  For Prolia, talk to your pediatrician regarding the use of this medicine in children. Special care may be needed. For Xgeva, talk to your pediatrician regarding the use of this medicine in children. While this drug may be prescribed for children as young as 13 years for selected conditions, precautions do apply.  Overdosage: If you think you have taken too much of this medicine contact a poison control center or emergency room at once.  NOTE: This medicine is only for you. Do not share this medicine with others.  What if I miss a dose?  It is important not to miss your dose. Call your doctor or health care professional if you are  unable to keep an appointment.  What may interact with this medicine?  Do not take this medicine with any of the following medications:  -other medicines containing denosumab  This medicine may also interact with the following medications:  -medicines that suppress the immune system  -medicines that treat cancer  -steroid medicines like prednisone or cortisone  This list may not describe all possible interactions. Give your health care provider a list of all the medicines, herbs, non-prescription drugs, or dietary supplements you use. Also tell them if you smoke, drink alcohol, or use illegal drugs. Some items may interact with your medicine.  What should I watch for while using this medicine?  Visit your doctor or health care professional for regular checks on your progress. Your doctor or health care professional may order blood tests and other tests to see how you are doing.  Call your doctor or health care professional if you get a cold or other infection while receiving this medicine. Do not treat yourself. This medicine may decrease your body's ability to fight infection.  You should make sure you get enough calcium and vitamin D while you are taking this medicine, unless your doctor tells you not to. Discuss the foods you eat and the vitamins you take with your health care professional.  See your dentist regularly. Brush and floss your teeth as directed. Before you have any dental work done, tell your dentist you are receiving this medicine.  Do   not become pregnant while taking this medicine or for 5 months after stopping it. Women should inform their doctor if they wish to become pregnant or think they might be pregnant. There is a potential for serious side effects to an unborn child. Talk to your health care professional or pharmacist for more information.  What side effects may I notice from receiving this medicine?  Side effects that you should report to your doctor or health care professional as soon as  possible:  -allergic reactions like skin rash, itching or hives, swelling of the face, lips, or tongue  -breathing problems  -chest pain  -fast, irregular heartbeat  -feeling faint or lightheaded, falls  -fever, chills, or any other sign of infection  -muscle spasms, tightening, or twitches  -numbness or tingling  -skin blisters or bumps, or is dry, peels, or red  -slow healing or unexplained pain in the mouth or jaw  -unusual bleeding or bruising  Side effects that usually do not require medical attention (Report these to your doctor or health care professional if they continue or are bothersome.):  -muscle pain  -stomach upset, gas  This list may not describe all possible side effects. Call your doctor for medical advice about side effects. You may report side effects to FDA at 1-800-FDA-1088.  Where should I keep my medicine?  This medicine is only given in a clinic, doctor's office, or other health care setting and will not be stored at home.  NOTE: This sheet is a summary. It may not cover all possible information. If you have questions about this medicine, talk to your doctor, pharmacist, or health care provider.      2016, [employer]vier/Gold Standard. (2011-09-10 12:37:47)

## 2016-08-25 ENCOUNTER — Other Ambulatory Visit: Payer: Self-pay | Admitting: Oncology

## 2016-09-12 ENCOUNTER — Other Ambulatory Visit (INDEPENDENT_AMBULATORY_CARE_PROVIDER_SITE_OTHER): Payer: Medicare Other

## 2016-09-12 ENCOUNTER — Ambulatory Visit (INDEPENDENT_AMBULATORY_CARE_PROVIDER_SITE_OTHER)
Admission: RE | Admit: 2016-09-12 | Discharge: 2016-09-12 | Disposition: A | Discharge status: Home / Self Care | Payer: Medicare Other | Source: Ambulatory Visit | Attending: Internal Medicine | Admitting: Internal Medicine

## 2016-09-12 ENCOUNTER — Ambulatory Visit (INDEPENDENT_AMBULATORY_CARE_PROVIDER_SITE_OTHER): Payer: Medicare Other | Admitting: Internal Medicine

## 2016-09-12 ENCOUNTER — Encounter: Payer: Self-pay | Admitting: Internal Medicine

## 2016-09-12 VITALS — BP 118/76 | HR 74 | Ht 67.0 in | Wt 280.0 lb

## 2016-09-12 DIAGNOSIS — R918 Other nonspecific abnormal finding of lung field: Secondary | ICD-10-CM | POA: Diagnosis not present

## 2016-09-12 DIAGNOSIS — R0609 Other forms of dyspnea: Secondary | ICD-10-CM

## 2016-09-12 LAB — BRAIN NATRIURETIC PEPTIDE: PRO B NATRI PEPTIDE: 14 pg/mL (ref 0.0–100.0)

## 2016-09-12 LAB — CBC WITH DIFFERENTIAL/PLATELET
BASOS ABS: 0 10*3/uL (ref 0.0–0.1)
Basophils Relative: 1.2 % (ref 0.0–3.0)
EOS ABS: 0.1 10*3/uL (ref 0.0–0.7)
Eosinophils Relative: 3.1 % (ref 0.0–5.0)
HEMATOCRIT: 37.8 % (ref 36.0–46.0)
Hemoglobin: 12.6 g/dL (ref 12.0–15.0)
LYMPHS ABS: 1.1 10*3/uL (ref 0.7–4.0)
LYMPHS PCT: 26.9 % (ref 12.0–46.0)
MCHC: 33.5 g/dL (ref 30.0–36.0)
MCV: 89.8 fl (ref 78.0–100.0)
MONO ABS: 0.2 10*3/uL (ref 0.1–1.0)
Monocytes Relative: 4.5 % (ref 3.0–12.0)
NEUTROS PCT: 64.3 % (ref 43.0–77.0)
Neutro Abs: 2.6 10*3/uL (ref 1.4–7.7)
PLATELETS: 308 10*3/uL (ref 150.0–400.0)
RBC: 4.21 Mil/uL (ref 3.87–5.11)
RDW: 16.4 % — AB (ref 11.5–15.5)
WBC: 4.1 10*3/uL (ref 4.0–10.5)

## 2016-09-12 LAB — BASIC METABOLIC PANEL
BUN: 8 mg/dL (ref 6–23)
CALCIUM: 9.3 mg/dL (ref 8.4–10.5)
CO2: 28 meq/L (ref 19–32)
CREATININE: 0.8 mg/dL (ref 0.40–1.20)
Chloride: 107 mEq/L (ref 96–112)
GFR: 94.6 mL/min (ref >60.00)
Glucose, Bld: 99 mg/dL (ref 70–99)
Potassium: 4.1 mEq/L (ref 3.5–5.1)
Sodium: 140 mEq/L (ref 135–145)

## 2016-09-12 LAB — NITRIC OXIDE: NITRIC OXIDE: 22

## 2016-09-12 MED ORDER — FAMOTIDINE 20 MG PO TABS: ORAL_TABLET | ORAL | 2 refills | Status: DC

## 2016-09-12 MED ORDER — PANTOPRAZOLE SODIUM 40 MG PO TBEC: 40.0000 mg | DELAYED_RELEASE_TABLET | Freq: Every day | ORAL | 2 refills | Status: DC

## 2016-09-12 NOTE — Progress Notes (Signed)
Subjective:    Patient ID: Michaela Little, female    DOB: 23-Mar-1961 .   MRN: [id number]    Brief patient profile:  56  yobf quit smoking 1990 with asthma dx  around age 56 worse symptoms since around 2004 when eval in  [place] moved to [place]  10/2010 and referred to pulmonary clinic by Michaela Little 02/08/2011    HPI 02/08/2011 1st pulmonary eval cc doe x 50 ft plus episodes of breathing attacks(Triggers are emotional, smells, uri's )  where can't breath at rest baseline qod albuterol much better when she takes her flovent, assoc with sensation on pnds/ no purulent sputum.  rec Work on hfa, no change rx > f/u in 3 months with pft's planned > did not return  10/28/2012 f/u ov/Michaela Little re asthma Chief Complaint  Patient presents with  . Follow-up    Last seen 02/08/11. Pt c/o increased SOB. Pt states that Cardiologist is unsiure if SOB is related to asthma or her heart.   doe x one aisle at [facility], sob nightly, no longer able to use cpap, on flovent bid and alb last dose > 8 h prior to OV  But "didn't help",  Prominent dry cough to point choking and vomit x over a year. Coughs so hard gets "crampy pains all over chest"  >>pred taper , d/c flovent , rx tramadol  Prednisone 10 mg take  4 each am x 2 days,   2 each am x 2 days,  1 each am x 2 days and stop  Take delsym two tsp every 12 hours and supplement if needed with  tramadol 50 mg up to 2 every 4 hours to suppress the urge to cough. Stop flovent and albuterol. GERD diet    11/13/2012 NP followup and medication review. Patient returns for a two-week followup and medication review. We reviewed all her medications and organized them into a medication calendar with patient education. Patient was having difficulty with persistent cough. Last visit. Was given a prednisone taper. She was recommended to discontinue her Flovent inhaler and was given tramadol for help with cough control. Patient reports that she is improved, and cough has substantially  decreased. She does report that she was unable to tolerate tramadol .   Spirometry last ov with minimal airflow obstruction noted.  rec Follow med calendar    04/27/2013 f/u ov/Michaela Little re: ? Asthma/ no med calendar  Chief Complaint  Patient presents with  . Followup with PFT    Pt c/o increased SOB for the past month. She also c/o increased cough- prod with beige sputum. She has not used rescue inhaler in the past several wks.   Doe x 100 ft and has to sit down, sometimes uncomfortable at rest including at time of pfts s obst Cough just started x 3 weeks more day than night. saba helps when remembers to use it  rec Med calendar > did not return    09/12/2016  f/u ov/Michaela Little re:  Consult re worse sob x 3 weeks but never improved p last ov and downhill since  Chief Complaint  Patient presents with  . Pulmonary Consult    Referred by Michaela Fail, PA. Pt c/o increased SOB x 3 wks- can occur with or without exertion. She gets out of breath walking approx 1 block.  She also c/o non prod cough that comes and goes. She is using her albuterol inhaler 2 x per wk on average.   onset mid April 2018 variably worse  sob even at rest / doe def p one block walking but never noct Apparently w/u in [place] with ? Af > no better with rx  Assoc with dry cough came back about the same time the breathing got worse again  And sense of globus off gerd rx though not clear this was ever better on gerd rx either No better with saba then walking and the resting sob does not resp to saba/ resolves on its own p 30 min or so.  No obvious day to day or daytime variability or assoc excess/ purulent sputum or mucus plugs or hemoptysis or cp or chest tightness, subjective wheeze or overt sinus or hb symptoms. No unusual exp hx or h/o childhood pna/ asthma or knowledge of premature birth.  Sleeping ok without nocturnal  or early am exacerbation  of respiratory  c/o's or need for noct saba. Also denies any obvious fluctuation of  symptoms with weather or environmental changes or other aggravating or alleviating factors except as outlined above   Current Medications, Allergies, Complete Past Medical History, Past Surgical History, Family History, and Social History were reviewed in [employer] [facility] record.  ROS  The following are not active complaints unless bolded sore throat, dysphagia, dental problems, itching, sneezing,  nasal congestion or excess/ purulent secretions, ear ache,   fever, chills, sweats, unintended wt loss, classically pleuritic or exertional cp,  orthopnea pnd or leg swelling, presyncope, palpitations, abdominal pain, anorexia, nausea, vomiting, diarrhea  or change in bowel or bladder habits, change in stools or urine, dysuria,hematuria,  rash, arthralgias, visual complaints, headache, numbness, weakness or ataxia or problems with walking or coordination,  change in mood/affect or memory.                         Objective:   Physical Exam   Obese bf nad abn affect  - Patient failed to answer a single question asked in a straightforward manner, tending to go off on tangents or answer questions with  Vague ambiguous answers or  medical terms or diagnoses    Wt 285 02/08/2011 >  260 10/28/2012 >259 11/13/2012  > 262  12/04/2012 > 04/27/2013 273 > 09/12/2016   280   HEENT: nl dentition, turbinates, and orophanx. Nl external ear canals without cough reflex   NECK :  without JVD/Nodes/TM/ nl carotid upstrokes bilaterally   LUNGS: no acc muscle use, clear to A and P bilaterally without cough on insp or exp maneuvers   CV:  RRR  no s3 or murmur or increase in P2, no edema   ABD:  Massively obese soft and nontender with nl excursion in the supine position. No bruits or organomegaly, bowel sounds nl  MS:  warm without deformities, calf tenderness, cyanosis or clubbing  SKIN: warm and dry without lesions    NEURO:  Hopeless/ helpless affect,  No apparent motor deficits        CXR PA and Lateral:   09/12/2016 :    I personally reviewed images and agree with radiology impression as follows:    Minimal bronchitic changes without acute infiltrate.  Labs ordered/ reviewed:      Chemistry      Component Value Date/Time   NA 140 09/12/2016 1644   NA 141 07/18/2016 1032   NA 141 06/27/2016 1131   K 4.1 09/12/2016 1644   K 4.1 06/27/2016 1131   CL 107 09/12/2016 1644   CL 105 07/07/2012 1327  CO2 28 09/12/2016 1644   CO2 23 06/27/2016 1131   BUN 8 09/12/2016 1644   BUN 10 07/18/2016 1032   BUN 13.9 06/27/2016 1131   CREATININE 0.80 09/12/2016 1644   CREATININE 0.8 06/27/2016 1131      Component Value Date/Time   CALCIUM 9.3 09/12/2016 1644   CALCIUM 9.5 06/27/2016 1131   ALKPHOS 88 07/18/2016 1032   ALKPHOS 95 06/27/2016 1131   AST 13 07/18/2016 1032   AST 14 06/27/2016 1131   ALT 10 07/18/2016 1032   ALT 8 06/27/2016 1131   BILITOT 0.7 07/18/2016 1032   BILITOT 0.85 06/27/2016 1131        Lab Results  Component Value Date   WBC 4.1 09/12/2016   HGB 12.6 09/12/2016   HCT 37.8 09/12/2016   MCV 89.8 09/12/2016   PLT 308.0 09/12/2016       Lab Results  Component Value Date   TSH 1.120 07/18/2016     Lab Results  Component Value Date   PROBNP 14.0 09/12/2016              I personally reviewed images and agree with radiology impression as follows:   Chest CT a 07/03/16  No evidence of pulmonary emboli or other acute abnormality in the chest, abdomen, or pelvis.     Assessment & Plan:

## 2016-09-12 NOTE — Patient Instructions (Signed)
Please remember to go to the lab and x-ray department downstairs in the basement  for your tests - we will call you with the results when they are available.     Pantoprazole (protonix) 40 mg   Take  30-60 min before first meal of the day and Pepcid (famotidine)  20 mg one @  bedtime until return to office - this is the best way to tell whether stomach acid is contributing to your problem.    GERD (REFLUX)  is an extremely common cause of respiratory symptoms just like yours , many times with no obvious heartburn at all.    It can be treated with medication, but also with lifestyle changes including elevation of the head of your bed (ideally with 6 inch  bed blocks),  Smoking cessation, avoidance of late meals, excessive alcohol, and avoid fatty foods, chocolate, peppermint, colas, red wine, and acidic juices such as orange juice.  NO MINT OR MENTHOL PRODUCTS SO NO COUGH DROPS   USE SUGARLESS CANDY INSTEAD (Jolley ranchers or Stover's or Life Savers) or even ice chips will also do - the key is to swallow to prevent all throat clearing. NO OIL BASED VITAMINS - use powdered substitutes.  Please schedule a follow up office visit in 6 weeks, call sooner if needed full pfts on return with all medications /inhalers/ solutions in hand so we can verify exactly what you are taking. This includes all medications from all doctors and over the counters

## 2016-09-13 ENCOUNTER — Other Ambulatory Visit (HOSPITAL_COMMUNITY)
Admission: RE | Admit: 2016-09-13 | Discharge: 2016-09-13 | Disposition: A | Discharge status: Home / Self Care | Payer: Medicare Other | Source: Ambulatory Visit | Attending: Obstetrics and Gynecology | Admitting: Obstetrics and Gynecology

## 2016-09-13 ENCOUNTER — Ambulatory Visit (INDEPENDENT_AMBULATORY_CARE_PROVIDER_SITE_OTHER): Payer: Medicare Other | Admitting: Obstetrics and Gynecology

## 2016-09-13 ENCOUNTER — Encounter: Payer: Self-pay | Admitting: Obstetrics and Gynecology

## 2016-09-13 VITALS — BP 149/75 | HR 73 | Ht 67.0 in | Wt 278.4 lb

## 2016-09-13 DIAGNOSIS — Z124 Encounter for screening for malignant neoplasm of cervix: Secondary | ICD-10-CM

## 2016-09-13 DIAGNOSIS — B9689 Other specified bacterial agents as the cause of diseases classified elsewhere: Secondary | ICD-10-CM | POA: Diagnosis not present

## 2016-09-13 DIAGNOSIS — N760 Acute vaginitis: Secondary | ICD-10-CM

## 2016-09-13 DIAGNOSIS — N952 Postmenopausal atrophic vaginitis: Secondary | ICD-10-CM

## 2016-09-13 LAB — RESPIRATORY ALLERGY PROFILE REGION II ~~LOC~~
ALLERGEN, CEDAR TREE, T6: 0.1 kU/L — AB
Allergen, A. alternata, m6: 6.33 kU/L — ABNORMAL HIGH
Allergen, C. Herbarum, M2: 0.24 kU/L — ABNORMAL HIGH
Allergen, Comm Silver Birch, t9: <0.1 kU/L
Allergen, Cottonwood, t14: <0.1 kU/L
Allergen, Mulberry, t76: <0.1 kU/L
Allergen, P. notatum, m1: 0.35 kU/L — ABNORMAL HIGH
Aspergillus fumigatus, m3: 0.96 kU/L — ABNORMAL HIGH
BOX ELDER: 0.36 kU/L — AB
Bermuda Grass: <0.1 kU/L
COCKROACH: 0.16 kU/L — AB
Common Ragweed: <0.1 kU/L
D. FARINAE: 0.12 kU/L — AB
Dog Dander: <0.1 kU/L
ELM IGE: 0.15 kU/L — AB
IGE (IMMUNOGLOBULIN E), SERUM: 1082 kU/L — AB (ref <115)
Johnson Grass: <0.1 kU/L
Pecan/Hickory Tree IgE: <0.1 kU/L
Rough Pigweed  IgE: 0.28 kU/L — ABNORMAL HIGH

## 2016-09-13 LAB — POCT URINALYSIS DIP (DEVICE)
GLUCOSE, UA: NEGATIVE mg/dL
Hgb urine dipstick: NEGATIVE
Leukocytes, UA: NEGATIVE
NITRITE: NEGATIVE
PH: 5.5 (ref 5.0–8.0)
PROTEIN: NEGATIVE mg/dL
Specific Gravity, Urine: >=1.03 (ref 1.005–1.030)
UROBILINOGEN UA: 1 mg/dL (ref 0.0–1.0)

## 2016-09-13 MED ORDER — METRONIDAZOLE 500 MG PO TABS: ORAL_TABLET | ORAL | 1 refills | Status: DC

## 2016-09-13 NOTE — Progress Notes (Signed)
Spoke with pt and notified of results per Dr. Wert. Pt verbalized understanding and denied any questions. 

## 2016-09-13 NOTE — Assessment & Plan Note (Signed)
Body mass index is 43.85 kg/m.  -  trending up Lab Results  Component Value Date   TSH 1.120 07/18/2016     Contributing to gerd risk/ doe/reviewed the need and the process to achieve and maintain neg calorie balance > defer f/u primary care including intermittently monitoring thyroid status

## 2016-09-13 NOTE — Assessment & Plan Note (Addendum)
- 10/28/2012   Walked RA x one lap @ 185 stopped due to sob/ prominent pseudowheeze  No desat  - PFTs 04/27/2013 > wnl  - 04/27/2013  Walked RA x 2 laps @ 185 ft each stopped due to sob no desat - Chest CT a 07/03/16 No evidence of pulmonary emboli or other acute abnormality - 09/12/2016   Walked RA x one lap @ 185 stopped due to sob/ slow pace, no desat - Spirometry 09/12/2016  No sign obstruction on fev1/fvc and minimal curvature to f/v loop - FENO 09/12/2016  =   22    Symptoms are markedly disproportionate to objective findings and not clear this is actually much of a  lung problem but pt does appear to have difficult to sort out respiratory symptoms of unknown origin for which  DDX  = almost all start with A and  include Adherence, Ace Inhibitors, Acid Reflux, Active Sinus Disease, Alpha 1 Antitripsin deficiency, Anxiety masquerading as Airways dz,  ABPA,  Allergy(esp in young), Aspiration (esp in elderly), Adverse effects of meds,  Active smokers, A bunch of PE's (a small clot burden can't cause this syndrome unless there is already severe underlying pulm or vascular dz with poor reserve) plus two Bs  = Bronchiectasis and Beta blocker use..and one C= CHF     Adherence is always the initial "prime suspect" and is a multilayered concern that requires a "trust but verify" approach in every patient - starting with knowing how to use medications, especially inhalers, correctly, keeping up with refills and understanding the fundamental difference between maintenance and prns vs those medications only taken for a very short course and then stopped and not refilled.  - will need to return with all meds in hand using a trust but verify approach to confirm accurate Medication  Reconciliation The principal here is that until we are certain that the  patients are doing what we've asked, it makes no sense to ask them to do more.   ? Acid (or non-acid) GERD > always difficult to exclude as up to 75% of pts in some  series report no assoc GI/ Heartburn symptoms> rec max (24h)  acid suppression and diet restrictions/ reviewed and instructions given in writing.   ? Anxiety/depression/ deconditioning  > usually at the bottom of this list of usual suspects but should be much higher on this pt's based on H and P and note already on psychotropics .  ? Allergy /asthma > nl FENO, no rhinitis/ no noct symptoms or obst on spirometry off rx or  response  to saba for resting sob so seems unlikley    ? A bunch of PE's > note neg CTa with same symptoms 2 m prior to OV , d dimer not helpful here as falsely pos in past  ? BB effects > very unlikely on such low doses of BB esp since no airflow obst on spirometry   ? chf > excluded with bnp so low   I had an extended discussion with the patient reviewing all relevant studies completed to date and  lasting 35 minutes of a 60  minute ov to re-establish here  re  severe non-specific but potentially very serious refractory respiratory symptoms of uncertain and potentially multiple  etiologies.   Each maintenance medication was reviewed in detail including most importantly the difference between maintenance and prns and under what circumstances the prns are to be triggered using an action plan format that is not reflected in the  computer generated alphabetically organized AVS.    Please see AVS for specific instructions unique to this office visit that I personally wrote and verbalized to the the pt in detail and then reviewed with pt  by my nurse highlighting any changes in therapy/plan of care  recommended at today's visit.

## 2016-09-13 NOTE — Progress Notes (Signed)
Obstetrics and Gynecology New Patient Evaluation  Appointment Date: 09/13/2016  OBGYN Clinic: [facility]  Primary Care Provider: Clent Demark  Referring Provider: Clent Demark, PA-C  Chief Complaint: vaginal atrophy, irritation  History of Present Illness: Michaela Little is a 56 y.o. African-American [id number] (Patient's last menstrual period was 02/25/2009.), seen for the above chief complaint. Her past medical history is significant for h/o TAH/BSO, ER+ breast cancer and currently on letrozole.  She is seen in consultation with PA Domenica Fail.  Patient states she's had a few weeks worth vaginal d/c, some itching and irritation and ?smell to the d/c. She's done douching at home but with no help. No prior s/s. No h/o PMB or spotting since her hyst  She also states that she has b/l hip "knots" that predates her hyst.   No fevers, chills, dysuria, hematuria, n/v/d/constipation.   Review of Systems: as noted in the History of Present Illness.  Past Medical History:  Past Medical History:  Diagnosis Date  . Anxiety   . Arthritis    hands, knees, hips  . Asthma   . Cancer ([place])   . Cervical stenosis (uterine cervix)   . Dyspnea on exertion   . GERD (gastroesophageal reflux disease)   . Headache(784.0)   . History of breast cancer    2010--  LEFT  s/p  mastectomy (in [place]) AND CHEMORADIATION--  NO RECURRENCE  . History of cervical dysplasia   . History of TB skin testing    AS TEEN--  [place]/ MEDS  . Hyperlipidemia   . Hypertension   . Malignant neoplasm of overlapping sites of left breast in female, estrogen receptor positive ([place]) 03/05/2013  . OSA (obstructive sleep apnea) moderate osa per study  09/2010   CPAP  , NOT USING ON REGULAR BASIS  . Pelvic pain in female   . Positive H. pylori test    08-05-2013  . Refusal of blood transfusions as patient is Jehovah's Witness   . Seasonal allergies   . Uterine fibroid   .  Wears glasses     Past Surgical History:  Past Surgical History:  Procedure Laterality Date  . CARDIOVASCULAR STRESS TEST  10-29-2012   low risk perfusion study/  no significant reversibity/ ef 66%/  normal wall motion  . CERVICAL CONIZATION W/BX  2012   in  [place] N/A 05/14/2012   Procedure: LAPAROSCOPIC CHOLECYSTECTOMY;  Surgeon: Ralene Ok, MD;  Location: [facility];  Service: General;  Laterality: N/A;  . COLONOSCOPY  2011   normal per patient - [place]  . DILATION AND CURETTAGE OF UTERUS  10/15/2011   Procedure: DILATATION AND CURETTAGE;  Surgeon: Melina Schools, MD;  Location: [facility];  Service: Gynecology;  Laterality: N/A;  Conization &  endocervical curettings  . EXAMINATION UNDER ANESTHESIA N/A 08/20/2013   Procedure: EXAM UNDER ANESTHESIA;  Surgeon: Margarette Asal, MD;  Location: [facility];  Service: Gynecology;  Laterality: N/A;  . [place]   right  . LAPAROSCOPIC ASSISTED VAGINAL HYSTERECTOMY N/A 10/26/2014   Procedure: HYSTERECTOMY ABDOMINAL ;  Surgeon: Molli Posey, MD;  Location: [facility];  Service: Gynecology;  Laterality: N/A;  . MASTECTOMY Left 11/2008  in [place]   . SALPINGOOPHORECTOMY Bilateral 10/26/2014   Procedure: BILATERAL SALPINGO OOPHORECTOMY;  Surgeon: Molli Posey, MD;  Location: [facility];  Service: Gynecology;  Laterality: Bilateral;  . TRANSTHORACIC ECHOCARDIOGRAM  10-29-2012  mild lvh/  ef 60-65%/  grade II diastolic dysfunction/  trivial mr  &  tr    Past Obstetrical History:  OB History  Gravida Para Term Preterm AB Living  10 5 5   5 5   SAB TAB Ectopic Multiple Live Births  3 2          # Outcome Date GA Lbr Len/2nd Weight Sex Delivery Anes PTL Lv  10 SAB           9 SAB           8 SAB           7 TAB           6 TAB           5 Term      Vag-Spont     4 Term      Vag-Spont     3 Term      Vag-Spont     2 Term      Vag-Spont     1 Term      Vag-Spont         Past Gynecological  History: As per HPI. Pap history: 2013, NILM Patient not sexually active.    Social History:  Social History   Social History  . Marital status: Single    Spouse name: N/A  . Number of children: 5  . Years of education: N/A   Occupational History  . Disabled    Social History Main Topics  . Smoking status: Former Smoker    Packs/day: 0.30    Years: 10.00    Types: Cigarettes    Quit date: 03/26/1988  . Smokeless tobacco: Never Used  . Alcohol use No  . Drug use: No  . Sexual activity: Not Currently   Other Topics Concern  . Not on file   Social History Narrative  . No narrative on file    Family History:  Family History  Problem Relation Age of Onset  . Breast cancer Mother   . Colon cancer Mother   . Hypotension Mother   . Asthma Mother   . Diabetes type II Mother   . Arthritis Mother   . Clotting disorder Mother   . Cancer Mother        breast/colon  . Mental illness Brother   . Heart disease Brother   . Cerebral palsy Daughter   . Emphysema Brother        never smoker  . Colon cancer Maternal Aunt   . Cancer Maternal Aunt        colon  . Colon cancer Maternal Uncle   . Cancer Maternal Uncle        colon  . Esophageal cancer Neg Hx   . Rectal cancer Neg Hx   . Stomach cancer Neg Hx   . Thyroid disease Neg Hx    Health Maintenance:  Colonoscopy Yes.   2015, recommend 10 year repeat   Medications Ms. Dieudonne had no medications administered during this visit. Current Outpatient Prescriptions  Medication Sig Dispense Refill  . carvedilol (COREG) 12.5 MG tablet Take 1 tablet (12.5 mg total) by mouth 2 (two) times daily with a meal. 90 tablet 1  . diphenhydrAMINE (BENADRYL) 25 mg capsule Take 25 mg by mouth every 6 (six) hours as needed for allergies.    . famotidine (PEPCID) 20 MG tablet One at bedtime 30 tablet 2  . furosemide (LASIX) 20 MG tablet Take 1 tablet (  20 mg total) by mouth daily. 30 tablet 3  . IBRANCE 100 MG capsule TAKE 1 CAPSULE BY  MOUTH DAILY FOR 21 DAYS, THEN 7 DAYS REST. TAKE WHOLE WITH FOOD. 21 capsule 0  . letrozole (FEMARA) 2.5 MG tablet Take 1 tablet (2.5 mg total) by mouth daily. 90 tablet 4  . promethazine (PHENERGAN) 25 MG tablet Take 1 tablet (25 mg total) by mouth every 6 (six) hours as needed for nausea or vomiting. 120 tablet 0  . albuterol (PROVENTIL HFA;VENTOLIN HFA) 108 (90 Base) MCG/ACT inhaler Inhale 2 puffs into the lungs every 4 (four) hours as needed for wheezing or shortness of breath. 1 Inhaler 5  . metroNIDAZOLE (FLAGYL) 500 MG tablet One tab po bid x 7d 28 tablet 1  . pantoprazole (PROTONIX) 40 MG tablet Take 1 tablet (40 mg total) by mouth daily. Take 30-60 min before first meal of the day (Patient not taking: Reported on 09/13/2016) 30 tablet 2   No current facility-administered medications for this visit.     Allergies Lisinopril-hydrochlorothiazide; Adhesive [tape]; Calcium; Calcium carbonate antacid; Fentanyl; Hctz [hydrochlorothiazide]; Latex; Lisinopril; Losartan potassium; and Oxycodone   Physical Exam:  BP (!) 149/75   Pulse 73   Ht 5\' 7"  (1.702 m)   Wt 278 lb 6.4 oz (126.3 kg)   LMP 02/25/2009   BMI 43.60 kg/m  Body mass index is 43.6 kg/m. General appearance: Well nourished, well developed female in no acute distress.  Cardiovascular: normal s1 and s2.  No murmurs, rubs or gallops. Respiratory:  Clear to auscultation bilateral. Normal respiratory effort Abdomen: positive bowel sounds and no masses, hernias; diffusely non tender to palpation, non distended Neuro/Psych:  Normal mood and affect.  Skin:  Warm and dry.  Lymphatic:  No inguinal lymphadenopathy.   Pelvic exam: is limited by body habitus EGBUS: within normal limits with moderate atrophy, Vagina: within normal limits and with no blood or discharge in the vault, and +moderate atrophy, Cervix: surgically absent, normal cuff (atrophic and friable with pap smear), nttp. Uterus:  Surgically absent and Adnexa:  no mass,  fullness, tenderness Rectovaginal: deferred  Normal ASIS  Laboratory: none  Radiology: none  Assessment: BV, atrophic vaginitis. Pt doing well  Plan:  1. Bacterial vaginosis D/w her that very common to have BV and atrophy given BSO and especially with her being on letrozole. I d/w her that unfortunately I do not recommend vaginal HRT given her breast cancer history. I recommended flagyl (refills given) for BV, kefir 4 ounces qhs, and OTC lubricants (list given) and to stop douching. 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 Pap done today and can be her last one if it is negative. Pt amenable to plan.  - Cytology - PAP  RTC PRN  Durene Romans MD Attending [facility])

## 2016-09-17 ENCOUNTER — Other Ambulatory Visit: Payer: Self-pay | Admitting: Internal Medicine

## 2016-09-17 DIAGNOSIS — J3089 Other allergic rhinitis: Secondary | ICD-10-CM

## 2016-09-17 LAB — CYTOLOGY - PAP
Chlamydia: NEGATIVE
Diagnosis: NEGATIVE
HPV (WINDOPATH): NOT DETECTED
NEISSERIA GONORRHEA: NEGATIVE
Trichomonas: NEGATIVE

## 2016-09-17 NOTE — Progress Notes (Signed)
Spoke with pt and notified of results per Dr. Wert. Pt verbalized understanding and denied any questions. 

## 2016-09-19 ENCOUNTER — Other Ambulatory Visit: Payer: Medicare Other

## 2016-09-19 ENCOUNTER — Ambulatory Visit: Payer: Medicare Other

## 2016-09-27 ENCOUNTER — Other Ambulatory Visit (HOSPITAL_BASED_OUTPATIENT_CLINIC_OR_DEPARTMENT_OTHER): Payer: Medicare Other

## 2016-09-27 ENCOUNTER — Ambulatory Visit (HOSPITAL_BASED_OUTPATIENT_CLINIC_OR_DEPARTMENT_OTHER): Payer: Medicare Other

## 2016-09-27 VITALS — BP 154/69 | HR 66 | Temp 97.0°F | Resp 18

## 2016-09-27 DIAGNOSIS — C50812 Malignant neoplasm of overlapping sites of left female breast: Secondary | ICD-10-CM

## 2016-09-27 DIAGNOSIS — C7951 Secondary malignant neoplasm of bone: Secondary | ICD-10-CM

## 2016-09-27 DIAGNOSIS — Z170 Estrogen receptor positive status [ER+]: Secondary | ICD-10-CM

## 2016-09-27 LAB — CBC WITH DIFFERENTIAL/PLATELET
BASO%: 2 % (ref 0.0–2.0)
BASOS ABS: 0.1 10*3/uL (ref 0.0–0.1)
EOS%: 5.4 % (ref 0.0–7.0)
Eosinophils Absolute: 0.2 10*3/uL (ref 0.0–0.5)
HCT: 33 % — ABNORMAL LOW (ref 34.8–46.6)
HEMOGLOBIN: 11.1 g/dL — AB (ref 11.6–15.9)
LYMPH#: 1.1 10*3/uL (ref 0.9–3.3)
LYMPH%: 35.9 % (ref 14.0–49.7)
MCH: 30.2 pg (ref 25.1–34.0)
MCHC: 33.6 g/dL (ref 31.5–36.0)
MCV: 89.9 fL (ref 79.5–101.0)
MONO#: 0.2 10*3/uL (ref 0.1–0.9)
MONO%: 7.4 % (ref 0.0–14.0)
NEUT%: 49.3 % (ref 38.4–76.8)
NEUTROS ABS: 1.5 10*3/uL (ref 1.5–6.5)
Platelets: 185 10*3/uL (ref 145–400)
RBC: 3.67 10*6/uL — ABNORMAL LOW (ref 3.70–5.45)
RDW: 15.6 % — AB (ref 11.2–14.5)
WBC: 3 10*3/uL — AB (ref 3.9–10.3)

## 2016-09-27 MED ORDER — DENOSUMAB 120 MG/1.7ML ~~LOC~~ SOLN
120.0000 mg | Freq: Once | SUBCUTANEOUS | Status: AC
  Administered 2016-09-27: 120 mg via SUBCUTANEOUS
  Filled 2016-09-27: qty 1.7

## 2016-09-27 NOTE — Patient Instructions (Signed)
Denosumab injection  What is this medicine?  DENOSUMAB (den oh sue mab) slows bone breakdown. Prolia is used to treat osteoporosis in women after menopause and in men. Xgeva is used to prevent bone fractures and other bone problems caused by cancer bone metastases. Xgeva is also used to treat giant cell tumor of the bone.  This medicine may be used for other purposes; ask your health care provider or pharmacist if you have questions.  What should I tell my health care provider before I take this medicine?  They need to know if you have any of these conditions:  -dental disease  -eczema  -infection or history of infections  -kidney disease or on dialysis  -low blood calcium or vitamin D  -malabsorption syndrome  -scheduled to have surgery or tooth extraction  -taking medicine that contains denosumab  -thyroid or parathyroid disease  -an unusual reaction to denosumab, other medicines, foods, dyes, or preservatives  -pregnant or trying to get pregnant  -breast-feeding  How should I use this medicine?  This medicine is for injection under the skin. It is given by a health care professional in a hospital or clinic setting.  If you are getting Prolia, a special MedGuide will be given to you by the pharmacist with each prescription and refill. Be sure to read this information carefully each time.  For Prolia, talk to your pediatrician regarding the use of this medicine in children. Special care may be needed. For Xgeva, talk to your pediatrician regarding the use of this medicine in children. While this drug may be prescribed for children as young as 13 years for selected conditions, precautions do apply.  Overdosage: If you think you have taken too much of this medicine contact a poison control center or emergency room at once.  NOTE: This medicine is only for you. Do not share this medicine with others.  What if I miss a dose?  It is important not to miss your dose. Call your doctor or health care professional if you are  unable to keep an appointment.  What may interact with this medicine?  Do not take this medicine with any of the following medications:  -other medicines containing denosumab  This medicine may also interact with the following medications:  -medicines that suppress the immune system  -medicines that treat cancer  -steroid medicines like prednisone or cortisone  This list may not describe all possible interactions. Give your health care provider a list of all the medicines, herbs, non-prescription drugs, or dietary supplements you use. Also tell them if you smoke, drink alcohol, or use illegal drugs. Some items may interact with your medicine.  What should I watch for while using this medicine?  Visit your doctor or health care professional for regular checks on your progress. Your doctor or health care professional may order blood tests and other tests to see how you are doing.  Call your doctor or health care professional if you get a cold or other infection while receiving this medicine. Do not treat yourself. This medicine may decrease your body's ability to fight infection.  You should make sure you get enough calcium and vitamin D while you are taking this medicine, unless your doctor tells you not to. Discuss the foods you eat and the vitamins you take with your health care professional.  See your dentist regularly. Brush and floss your teeth as directed. Before you have any dental work done, tell your dentist you are receiving this medicine.  Do   not become pregnant while taking this medicine or for 5 months after stopping it. Women should inform their doctor if they wish to become pregnant or think they might be pregnant. There is a potential for serious side effects to an unborn child. Talk to your health care professional or pharmacist for more information.  What side effects may I notice from receiving this medicine?  Side effects that you should report to your doctor or health care professional as soon as  possible:  -allergic reactions like skin rash, itching or hives, swelling of the face, lips, or tongue  -breathing problems  -chest pain  -fast, irregular heartbeat  -feeling faint or lightheaded, falls  -fever, chills, or any other sign of infection  -muscle spasms, tightening, or twitches  -numbness or tingling  -skin blisters or bumps, or is dry, peels, or red  -slow healing or unexplained pain in the mouth or jaw  -unusual bleeding or bruising  Side effects that usually do not require medical attention (Report these to your doctor or health care professional if they continue or are bothersome.):  -muscle pain  -stomach upset, gas  This list may not describe all possible side effects. Call your doctor for medical advice about side effects. You may report side effects to FDA at 1-800-FDA-1088.  Where should I keep my medicine?  This medicine is only given in a clinic, doctor's office, or other health care setting and will not be stored at home.  NOTE: This sheet is a summary. It may not cover all possible information. If you have questions about this medicine, talk to your doctor, pharmacist, or health care provider.      2016, [employer]vier/Gold Standard. (2011-09-10 12:37:47)

## 2016-09-28 ENCOUNTER — Other Ambulatory Visit: Payer: Self-pay | Admitting: Oncology

## 2016-10-17 ENCOUNTER — Other Ambulatory Visit: Payer: Self-pay | Admitting: Oncology

## 2016-10-17 ENCOUNTER — Telehealth: Payer: Self-pay | Admitting: Oncology

## 2016-10-17 ENCOUNTER — Ambulatory Visit: Payer: Medicare Other

## 2016-10-17 ENCOUNTER — Other Ambulatory Visit: Payer: Medicare Other

## 2016-10-17 NOTE — Telephone Encounter (Signed)
sw pt to inform of r/s lab/inj appt to next week. Pt stated she will be out of town and will not be back until 8/30 appts.

## 2016-10-18 ENCOUNTER — Ambulatory Visit (INDEPENDENT_AMBULATORY_CARE_PROVIDER_SITE_OTHER): Payer: Medicare Other | Admitting: Internal Medicine

## 2016-10-18 ENCOUNTER — Encounter: Payer: Self-pay | Admitting: Internal Medicine

## 2016-10-18 VITALS — BP 130/80 | HR 67 | Ht 65.5 in | Wt 279.0 lb

## 2016-10-18 DIAGNOSIS — R0609 Other forms of dyspnea: Secondary | ICD-10-CM

## 2016-10-18 DIAGNOSIS — R05 Cough: Secondary | ICD-10-CM

## 2016-10-18 DIAGNOSIS — R059 Cough, unspecified: Secondary | ICD-10-CM

## 2016-10-18 LAB — PULMONARY FUNCTION TEST
DL/VA % pred: 95 %
DL/VA: 4.73 ml/min/mmHg/L
DLCO COR: 18.23 ml/min/mmHg
DLCO cor % pred: 69 %
DLCO unc % pred: 71 %
DLCO unc: 18.77 ml/min/mmHg
FEF 25-75 Post: 3.16 L/sec
FEF 25-75 Pre: 2.85 L/sec
FEF2575-%Change-Post: 10 %
FEF2575-%PRED-PRE: 128 %
FEF2575-%Pred-Post: 141 %
FEV1-%Change-Post: 4 %
FEV1-%PRED-PRE: 98 %
FEV1-%Pred-Post: 103 %
FEV1-POST: 2.33 L
FEV1-Pre: 2.22 L
FEV1FVC-%CHANGE-POST: 2 %
FEV1FVC-%PRED-PRE: 105 %
FEV6-%Change-Post: 1 %
FEV6-%Pred-Post: 96 %
FEV6-%Pred-Pre: 94 %
FEV6-Post: 2.67 L
FEV6-Pre: 2.63 L
FEV6FVC-%Change-Post: 0 %
FEV6FVC-%Pred-Post: 103 %
FEV6FVC-%Pred-Pre: 103 %
FVC-%CHANGE-POST: 1 %
FVC-%PRED-POST: 93 %
FVC-%PRED-PRE: 91 %
FVC-POST: 2.68 L
FVC-PRE: 2.63 L
PRE FEV1/FVC RATIO: 85 %
Post FEV1/FVC ratio: 87 %
Post FEV6/FVC ratio: 100 %
Pre FEV6/FVC Ratio: 100 %
RV % pred: 99 %
RV: 2.03 L
TLC % PRED: 89 %
TLC: 4.72 L

## 2016-10-18 NOTE — Progress Notes (Signed)
PFT done today. 

## 2016-10-18 NOTE — Patient Instructions (Addendum)
Stop benadryl   For drainage / throat tickle try take CHLORPHENIRAMINE  4 mg - take one every 4 hours as needed - available over the counter- may cause drowsiness so start with just a bedtime dose or two and see how you tolerate it before trying in daytime    If you are satisfied with your treatment plan,  let your doctor know and he/she can either refill your medications or you can return here when your prescription runs out.     If in any way you are not 100% satisfied,  please tell us.  If 100% better, tell your friends!  Pulmonary follow up is as needed

## 2016-10-18 NOTE — Progress Notes (Signed)
Subjective:    Patient ID: Robby Sermon, female    DOB: [birth year] .   MRN: [id number]    Brief patient profile:  56  yobf quit smoking 1990 with asthma dx  around age 56 worse symptoms since around 2004 when eval in  [place] pulmonary moved to [place]  10/2010 and referred to pulmonary clinic by Dr Bubba Camp 02/08/2011    HPI 02/08/2011 1st pulmonary eval cc doe x 50 ft plus episodes of breathing attacks(Triggers are emotional, smells, uri's )  where can't breath at rest baseline qod albuterol much better when she takes her flovent, assoc with sensation on pnds/ no purulent sputum.  rec Work on hfa, no change rx > f/u in 3 months with pft's planned > did not return  10/28/2012 f/u ov/Elanda Garmany re asthma Chief Complaint  Patient presents with  . Follow-up    Last seen 02/08/11. Pt c/o increased SOB. Pt states that Cardiologist is unsiure if SOB is related to asthma or her heart.   doe x one aisle at [facility], sob nightly, no longer able to use cpap, on flovent bid and alb last dose > 8 h prior to OV  But "didn't help",  Prominent dry cough to point choking and vomit x over a year. Coughs so hard gets "crampy pains all over chest"  >>pred taper , d/c flovent , rx tramadol  Prednisone 10 mg take  4 each am x 2 days,   2 each am x 2 days,  1 each am x 2 days and stop  Take delsym two tsp every 12 hours and supplement if needed with  tramadol 50 mg up to 2 every 4 hours to suppress the urge to cough. Stop flovent and albuterol. GERD diet    11/13/2012 NP followup and medication review. Patient returns for a two-week followup and medication review. We reviewed all her medications and organized them into a medication calendar with patient education. Patient was having difficulty with persistent cough. Last visit. Was given a prednisone taper. She was recommended to discontinue her Flovent inhaler and was given tramadol for help with cough control. Patient reports that she is improved, and cough has substantially  decreased. She does report that she was unable to tolerate tramadol .   Spirometry last ov with minimal airflow obstruction noted.  rec Follow med calendar    04/27/2013 f/u ov/Helmi Hechavarria re: ? Asthma/ no med calendar  Chief Complaint  Patient presents with  . Followup with PFT    Pt c/o increased SOB for the past month. She also c/o increased cough- prod with beige sputum. She has not used rescue inhaler in the past several wks.   Doe x 100 ft and has to sit down, sometimes uncomfortable at rest including at time of pfts s obst Cough just started x 3 weeks more day than night. saba helps when remembers to use it  rec Med calendar > did not return    09/12/2016  f/u ov/Lupita Rosales re:  Consult re worse sob x 3 weeks but never improved p last ov and downhill since  Chief Complaint  Patient presents with  . Pulmonary Consult    Referred by Domenica Fail, PA. Pt c/o increased SOB x 3 wks- can occur with or without exertion. She gets out of breath walking approx 1 block.  She also c/o non prod cough that comes and goes. She is using her albuterol inhaler 2 x per wk on average.   onset mid April 2018 variably worse  sob even at rest / doe def p one block walking but never noct [place] w/u in [place] with ? Af > no better with rx  Assoc with dry cough came back about the same time the breathing got worse again  And sense of globus off gerd rx though not clear this was ever better on gerd rx either No better with saba then walking and the resting sob does not resp to saba/ resolves on its own p 30 min or so rec  Pantoprazole (protonix) 40 mg   Take  30-60 min before first meal of the day and Pepcid (famotidine)  20 mg one @  bedtime until return to office - this is the best way to tell whether stomach acid is contributing to your problem.   GERD diet . Please schedule a follow up office visit in 6 weeks, call sooner if needed full pfts on return with all medications /inhalers/ solutions in hand so we can verify  exactly what you are taking. This includes all medications from all doctors and over the counters     10/18/2016  f/u ov/Lissandro Dilorenzo re: uacs with pos RAST to mold/ trees/  IgE 1082 / did not bring meds as req Chief Complaint  Patient presents with  . Follow-up    PFT's done today. Her breathing has been doing some better "comes and goes". She does c/o pain in her right rib area upon deep inspiration x 1 wk. Her cough is unchanged. She states she rarely has to use rescue inhaler.   feels pnds assoc with dry hacking cough thinks may have pulled a muscle from coughing Using benadryl helps some/ using ppi ac and h2hs  No longer sob at rest  Doe = MMRC3 = can't walk 100 yards even at a slow pace at a flat grade s stopping due to sob  But can shop at [facility] hanging on to cart  No change with any of her symptoms p saba    No obvious day to day or daytime variability or assoc excess/ purulent sputum or mucus plugs or hemoptysis or   chest tightness, subjective wheeze or overt  hb symptoms. No unusual exp hx or h/o childhood pna/ asthma or knowledge of premature birth.  Sleeping ok without nocturnal  or early am exacerbation  of respiratory  c/o's or need for noct saba. Also denies any obvious fluctuation of symptoms with weather or environmental changes or other aggravating or alleviating factors except as outlined above   Current Medications, Allergies, Complete Past Medical History, Past Surgical History, Family History, and Social History were reviewed in [employer] record.  ROS  The following are not active complaints unless bolded sore throat, dysphagia, dental problems, itching, sneezing,  nasal congestion or excess/ purulent secretions, ear ache,   fever, chills, sweats, unintended wt loss, classically  xertional cp,  orthopnea pnd or leg swelling, presyncope, palpitations, abdominal pain, anorexia, nausea, vomiting, diarrhea  or change in bowel or bladder habits, change in stools  or urine, dysuria,hematuria,  rash, arthralgias, visual complaints, headache, numbness, weakness or ataxia or problems with walking or coordination,  change in mood/affect or memory.                Objective:   Physical Exam   Obese bf nad abn affect     Wt 285 02/08/2011 >  260 10/28/2012 >259 11/13/2012  > 262  12/04/2012 > 04/27/2013 273 > 09/12/2016   280  > 10/18/2016  279  HEENT: nl dentition, turbinates, and oropharynx which is pristine. Nl external ear canals without cough reflex   NECK :  without JVD/Nodes/TM/ nl carotid upstrokes bilaterally   LUNGS: no acc muscle use, clear to A and P bilaterally without cough on insp or exp maneuvers/ mild  tenderness R flank just below last rib margin/ no rash     CV:  RRR  no s3 or murmur or increase in P2, no edema   ABD:  Massively obese soft and nontender with nl excursion in the supine position. No bruits or organomegaly, bowel sounds nl  MS:  warm without deformities, calf tenderness, cyanosis or clubbing  SKIN: warm and dry without lesions    NEURO:   Alert/ approp no deficits        CXR PA and Lateral:   09/12/2016 :    I personally reviewed images and agree with radiology impression as follows:    Minimal bronchitic changes without acute infiltrate.  Labs ordered/ reviewed:      Chemistry      Component Value Date/Time   NA 140 09/12/2016 1644   NA 141 07/18/2016 1032   NA 141 06/27/2016 1131   K 4.1 09/12/2016 1644   K 4.1 06/27/2016 1131   CL 107 09/12/2016 1644   CL 105 07/07/2012 1327   CO2 28 09/12/2016 1644   CO2 23 06/27/2016 1131   BUN 8 09/12/2016 1644   BUN 10 07/18/2016 1032   BUN 13.9 06/27/2016 1131   CREATININE 0.80 09/12/2016 1644   CREATININE 0.8 06/27/2016 1131      Component Value Date/Time   CALCIUM 9.3 09/12/2016 1644   CALCIUM 9.5 06/27/2016 1131   ALKPHOS 88 07/18/2016 1032   ALKPHOS 95 06/27/2016 1131   AST 13 07/18/2016 1032   AST 14 06/27/2016 1131   ALT 10 07/18/2016 1032    ALT 8 06/27/2016 1131   BILITOT 0.7 07/18/2016 1032   BILITOT 0.85 06/27/2016 1131        Lab Results  Component Value Date   WBC 4.1 09/12/2016   HGB 12.6 09/12/2016   HCT 37.8 09/12/2016   MCV 89.8 09/12/2016   PLT 308.0 09/12/2016       Lab Results  Component Value Date   TSH 1.120 07/18/2016     Lab Results  Component Value Date   PROBNP 14.0 09/12/2016              I personally reviewed images and agree with radiology impression as follows:   Chest CT a 07/03/16  No evidence of pulmonary emboli or other acute abnormality in the chest, abdomen, or pelvis.     Assessment & Plan:   Outpatient Encounter Prescriptions as of 10/18/2016  Medication Sig  . albuterol (PROVENTIL HFA;VENTOLIN HFA) 108 (90 Base) MCG/ACT inhaler Inhale 2 puffs into the lungs every 4 (four) hours as needed for wheezing or shortness of breath.  . carvedilol (COREG) 12.5 MG tablet Take 1 tablet (12.5 mg total) by mouth 2 (two) times daily with a meal.  . diphenhydrAMINE (BENADRYL) 25 mg capsule Take 25 mg by mouth every 6 (six) hours as needed for allergies.  . famotidine (PEPCID) 20 MG tablet One at bedtime  . furosemide (LASIX) 20 MG tablet Take 1 tablet (20 mg total) by mouth daily. (Patient taking differently: Take 20 mg by mouth daily as needed. )  . IBRANCE 100 MG capsule TAKE 1 CAPSULE BY MOUTH DAILY FOR 21 DAYS, THEN 7 DAYS REST. TAKE  WHOLE WITH FOOD.  Marland Kitchen letrozole (FEMARA) 2.5 MG tablet Take 1 tablet (2.5 mg total) by mouth daily.  . pantoprazole (PROTONIX) 40 MG tablet Take 1 tablet (40 mg total) by mouth daily. Take 30-60 min before first meal of the day  . promethazine (PHENERGAN) 25 MG tablet Take 1 tablet (25 mg total) by mouth every 6 (six) hours as needed for nausea or vomiting.  . [DISCONTINUED] metroNIDAZOLE (FLAGYL) 500 MG tablet One tab po bid x 7d (Patient not taking: Reported on 10/18/2016)   No facility-administered encounter medications on file as of 10/18/2016.

## 2016-10-18 NOTE — Assessment & Plan Note (Signed)
-   10/28/2012   Walked RA x one lap @ 185 stopped due to sob/ prominent pseudowheeze  No desat  - PFTs 04/27/2013 > wnl  - 04/27/2013  Walked RA x 2 laps @ 185 ft each stopped due to sob no desat -  Chest CT a 07/03/16 No evidence of pulmonary emboli or other acute abnormality - 09/12/2016   Walked RA x one lap @ 185 stopped due to sob/ slow pace, no desat - Spirometry 09/12/2016  No sign obstruction on fev1/fvc and minimal curvature to f/v loop  - FENO 09/12/2016  =   22  - Allergy profile  09/12/16  >  Eos 0.1 /  IgE  1082 RAST multiple Pos > rec allergy eval   - PFT's  10/18/2016  FEV1 2.33  (103 % ) ratio 87   p 4  % improvement from saba p  nothing prior to study with DLCO  71 % corrects to 95  % for alv volume  And ERV 23    Clearly her main issues are MO and allergic rhinitis with pnds/chronic cough c/w uacs (see separate a/p)   Reviewed pfts and impact on ERV and f/u can be prn

## 2016-10-18 NOTE — Assessment & Plan Note (Addendum)
erv 10/18/2016  = 23 c/w effects of obesity on lung volumes   Body mass index is 45.72 kg/m.  -  trending same Lab Results  Component Value Date   TSH 1.120 07/18/2016     Contributing to gerd risk/ doe/reviewed the need and the process to achieve and maintain neg calorie balance > defer f/u primary care including intermittently monitoring thyroid status

## 2016-10-18 NOTE — Assessment & Plan Note (Signed)
-   sinus CT  04/30/2013 > Mild paranasal sinus mucosal thickening as above, greatest in the left maxillary sinus where it narrows the ostium - Allergy profile  09/12/16  >  Eos 0.1 /  IgE  1082 RAST multiple Pos > rec allergy eval   - added 1st gen h1 to gerd rx 10/18/2016 >>>   Now having likely mscp / abd pain from coughing fits related to pnds > should respond to h1 short term and allergy eval/ rx longterm with f/u here prn  I had an extended final summary discussion with the patient reviewing all relevant studies completed to date and  lasting 15 to 20 minutes of a 25 minute visit    Each maintenance medication was reviewed in detail including most importantly the difference between maintenance and as needed and under what circumstances the prns are to be used.  Please see AVS for specific  Instructions which are unique to this visit and I personally typed out  which were reviewed in detail in writing with the patient and a copy provided.

## 2016-10-25 ENCOUNTER — Other Ambulatory Visit: Payer: Medicare Other

## 2016-10-25 ENCOUNTER — Ambulatory Visit: Payer: Medicare Other

## 2016-11-14 ENCOUNTER — Other Ambulatory Visit: Payer: Medicare Other

## 2016-11-14 ENCOUNTER — Ambulatory Visit: Payer: Medicare Other

## 2016-11-22 ENCOUNTER — Ambulatory Visit: Payer: Self-pay

## 2016-11-22 ENCOUNTER — Other Ambulatory Visit: Payer: Medicare Other

## 2016-11-27 ENCOUNTER — Ambulatory Visit (INDEPENDENT_AMBULATORY_CARE_PROVIDER_SITE_OTHER): Payer: Medicare Other | Admitting: Allergy and Immunology

## 2016-11-27 ENCOUNTER — Ambulatory Visit (HOSPITAL_BASED_OUTPATIENT_CLINIC_OR_DEPARTMENT_OTHER): Payer: Medicare Other

## 2016-11-27 ENCOUNTER — Other Ambulatory Visit: Payer: Self-pay

## 2016-11-27 ENCOUNTER — Encounter: Payer: Self-pay | Admitting: Allergy and Immunology

## 2016-11-27 ENCOUNTER — Other Ambulatory Visit (HOSPITAL_BASED_OUTPATIENT_CLINIC_OR_DEPARTMENT_OTHER): Payer: Medicare Other

## 2016-11-27 VITALS — BP 130/80 | HR 72 | Resp 16 | Ht 66.0 in | Wt 283.0 lb

## 2016-11-27 VITALS — BP 140/71 | HR 64 | Temp 97.5°F | Resp 20

## 2016-11-27 DIAGNOSIS — C7951 Secondary malignant neoplasm of bone: Secondary | ICD-10-CM

## 2016-11-27 DIAGNOSIS — C50812 Malignant neoplasm of overlapping sites of left female breast: Secondary | ICD-10-CM

## 2016-11-27 DIAGNOSIS — Z170 Estrogen receptor positive status [ER+]: Principal | ICD-10-CM

## 2016-11-27 DIAGNOSIS — J4530 Mild persistent asthma, uncomplicated: Secondary | ICD-10-CM

## 2016-11-27 DIAGNOSIS — K219 Gastro-esophageal reflux disease without esophagitis: Secondary | ICD-10-CM

## 2016-11-27 DIAGNOSIS — C50912 Malignant neoplasm of unspecified site of left female breast: Secondary | ICD-10-CM

## 2016-11-27 DIAGNOSIS — J3089 Other allergic rhinitis: Secondary | ICD-10-CM | POA: Diagnosis not present

## 2016-11-27 DIAGNOSIS — L500 Allergic urticaria: Secondary | ICD-10-CM

## 2016-11-27 LAB — COMPREHENSIVE METABOLIC PANEL
ALBUMIN: 3.5 g/dL (ref 3.5–5.0)
ALK PHOS: 82 U/L (ref 40–150)
ALT: 9 U/L (ref 0–55)
ANION GAP: 7 meq/L (ref 3–11)
AST: 16 U/L (ref 5–34)
BILIRUBIN TOTAL: 1.29 mg/dL — AB (ref 0.20–1.20)
BUN: 13.4 mg/dL (ref 7.0–26.0)
CALCIUM: 9.3 mg/dL (ref 8.4–10.4)
CHLORIDE: 106 meq/L (ref 98–109)
CO2: 28 mEq/L (ref 22–29)
CREATININE: 0.9 mg/dL (ref 0.6–1.1)
EGFR: 78 mL/min/{1.73_m2} — ABNORMAL LOW (ref >90)
Glucose: 102 mg/dl (ref 70–140)
Potassium: 4 mEq/L (ref 3.5–5.1)
Sodium: 141 mEq/L (ref 136–145)
Total Protein: 7.2 g/dL (ref 6.4–8.3)

## 2016-11-27 LAB — CBC WITH DIFFERENTIAL/PLATELET
BASO%: 0.7 % (ref 0.0–2.0)
Basophils Absolute: 0 10*3/uL (ref 0.0–0.1)
EOS%: 3.8 % (ref 0.0–7.0)
Eosinophils Absolute: 0.1 10*3/uL (ref 0.0–0.5)
HCT: 33.4 % — ABNORMAL LOW (ref 34.8–46.6)
HGB: 11.1 g/dL — ABNORMAL LOW (ref 11.6–15.9)
LYMPH#: 0.7 10*3/uL — AB (ref 0.9–3.3)
LYMPH%: 24.2 % (ref 14.0–49.7)
MCH: 30.4 pg (ref 25.1–34.0)
MCHC: 33.2 g/dL (ref 31.5–36.0)
MCV: 91.5 fL (ref 79.5–101.0)
MONO#: 0.1 10*3/uL (ref 0.1–0.9)
MONO%: 4.4 % (ref 0.0–14.0)
NEUT#: 2 10*3/uL (ref 1.5–6.5)
NEUT%: 66.9 % (ref 38.4–76.8)
PLATELETS: 238 10*3/uL (ref 145–400)
RBC: 3.65 10*6/uL — ABNORMAL LOW (ref 3.70–5.45)
RDW: 14.3 % (ref 11.2–14.5)
WBC: 2.9 10*3/uL — ABNORMAL LOW (ref 3.9–10.3)

## 2016-11-27 MED ORDER — FLUTICASONE PROPIONATE HFA 110 MCG/ACT IN AERO: 2.0000 | INHALATION_SPRAY | Freq: Two times a day (BID) | RESPIRATORY_TRACT | 5 refills | Status: DC

## 2016-11-27 MED ORDER — DENOSUMAB 120 MG/1.7ML ~~LOC~~ SOLN
120.0000 mg | Freq: Once | SUBCUTANEOUS | Status: AC
  Administered 2016-11-27: 120 mg via SUBCUTANEOUS
  Filled 2016-11-27: qty 1.7

## 2016-11-27 MED ORDER — MONTELUKAST SODIUM 10 MG PO TABS: 10.0000 mg | ORAL_TABLET | Freq: Every day | ORAL | 5 refills | Status: DC

## 2016-11-27 MED ORDER — RANITIDINE HCL 300 MG PO TABS: 300.0000 mg | ORAL_TABLET | Freq: Every day | ORAL | 5 refills | Status: DC

## 2016-11-27 NOTE — Patient Instructions (Addendum)
  1. Allergen avoidance measures  2. Treat and prevent inflammation:   A. OTC Nasacort - 1 spray each nostril one time per day  B. Flovent 110 - 2 inhalations one time per day with spacer  C. montelukast 10 mg - one tablet one time per day  3. Treat and prevent reflux:   A. consolidate all chocolate and caffeine consumption  B. pantoprazole 40 mg tablet in a.m.  C. ranitidine 300 mg tablet in PM  4. If needed:   A. Claritin 10 mg tablet 1-2 times per day  B. OTC Benadryl  C. Proventil HFA or similar 2 inhalations every 4-6 hours  5. Return to clinic in 4 weeks or earlier if problem

## 2016-11-27 NOTE — Patient Instructions (Signed)
Denosumab injection  What is this medicine?  DENOSUMAB (den oh sue mab) slows bone breakdown. Prolia is used to treat osteoporosis in women after menopause and in men. Xgeva is used to prevent bone fractures and other bone problems caused by cancer bone metastases. Xgeva is also used to treat giant cell tumor of the bone.  This medicine may be used for other purposes; ask your health care provider or pharmacist if you have questions.  What should I tell my health care provider before I take this medicine?  They need to know if you have any of these conditions:  -dental disease  -eczema  -infection or history of infections  -kidney disease or on dialysis  -low blood calcium or vitamin D  -malabsorption syndrome  -scheduled to have surgery or tooth extraction  -taking medicine that contains denosumab  -thyroid or parathyroid disease  -an unusual reaction to denosumab, other medicines, foods, dyes, or preservatives  -pregnant or trying to get pregnant  -breast-feeding  How should I use this medicine?  This medicine is for injection under the skin. It is given by a health care professional in a hospital or clinic setting.  If you are getting Prolia, a special MedGuide will be given to you by the pharmacist with each prescription and refill. Be sure to read this information carefully each time.  For Prolia, talk to your pediatrician regarding the use of this medicine in children. Special care may be needed. For Xgeva, talk to your pediatrician regarding the use of this medicine in children. While this drug may be prescribed for children as young as 13 years for selected conditions, precautions do apply.  Overdosage: If you think you have taken too much of this medicine contact a poison control center or emergency room at once.  NOTE: This medicine is only for you. Do not share this medicine with others.  What if I miss a dose?  It is important not to miss your dose. Call your doctor or health care professional if you are  unable to keep an appointment.  What may interact with this medicine?  Do not take this medicine with any of the following medications:  -other medicines containing denosumab  This medicine may also interact with the following medications:  -medicines that suppress the immune system  -medicines that treat cancer  -steroid medicines like prednisone or cortisone  This list may not describe all possible interactions. Give your health care provider a list of all the medicines, herbs, non-prescription drugs, or dietary supplements you use. Also tell them if you smoke, drink alcohol, or use illegal drugs. Some items may interact with your medicine.  What should I watch for while using this medicine?  Visit your doctor or health care professional for regular checks on your progress. Your doctor or health care professional may order blood tests and other tests to see how you are doing.  Call your doctor or health care professional if you get a cold or other infection while receiving this medicine. Do not treat yourself. This medicine may decrease your body's ability to fight infection.  You should make sure you get enough calcium and vitamin D while you are taking this medicine, unless your doctor tells you not to. Discuss the foods you eat and the vitamins you take with your health care professional.  See your dentist regularly. Brush and floss your teeth as directed. Before you have any dental work done, tell your dentist you are receiving this medicine.  Do   not become pregnant while taking this medicine or for 5 months after stopping it. Women should inform their doctor if they wish to become pregnant or think they might be pregnant. There is a potential for serious side effects to an unborn child. Talk to your health care professional or pharmacist for more information.  What side effects may I notice from receiving this medicine?  Side effects that you should report to your doctor or health care professional as soon as  possible:  -allergic reactions like skin rash, itching or hives, swelling of the face, lips, or tongue  -breathing problems  -chest pain  -fast, irregular heartbeat  -feeling faint or lightheaded, falls  -fever, chills, or any other sign of infection  -muscle spasms, tightening, or twitches  -numbness or tingling  -skin blisters or bumps, or is dry, peels, or red  -slow healing or unexplained pain in the mouth or jaw  -unusual bleeding or bruising  Side effects that usually do not require medical attention (Report these to your doctor or health care professional if they continue or are bothersome.):  -muscle pain  -stomach upset, gas  This list may not describe all possible side effects. Call your doctor for medical advice about side effects. You may report side effects to FDA at 1-800-FDA-1088.  Where should I keep my medicine?  This medicine is only given in a clinic, doctor's office, or other health care setting and will not be stored at home.  NOTE: This sheet is a summary. It may not cover all possible information. If you have questions about this medicine, talk to your doctor, pharmacist, or health care provider.      2016, [employer]vier/Gold Standard. (2011-09-10 12:37:47)

## 2016-11-27 NOTE — Progress Notes (Signed)
Dear Dr. Altamease Oiler,  Thank you for referring Michaela Little to the [facility] on 11/27/2016.   Below is a summation of this patient's evaluation and recommendations.  Thank you for your referral. I will keep you informed about this patient's response to treatment.   If you have any questions please do not hesitate to contact me.   Sincerely,  Jiles Prows, MD Allergy / Immunology [facility]   ______________________________________________________________________    NEW PATIENT NOTE  Referring Provider: Clent Demark, PA-C Primary Provider: [name] Date of office visit: 11/27/2016    Subjective:   Chief Complaint:  Michaela Little (DOB: 1960-04-04) is a 56 y.o. female who presents to the clinic on 11/27/2016 with a chief complaint of New Patient (Initial Visit) and Allergies .     HPI: Michaela Little presents to this clinic in evaluation of several different issues.  First, she has a prolonged cough that has has evaluation by Dr. Clois Comber in the past and has been treated with various anti-inflammatory agents for her respiratory tract including the use of albuterol. She complains of having dry cough and throat clearing and drainage in her throat. She has a history of coughing spells associated with gagging and retching and posttussive emesis and micturition. She does have a history of reflux and it has been suggested that she utilize various medications to address this issue especially given the fact that it may be contributing to her cough. However, even though she has been prescribed pantoprazole and Pepcid she does not use these agents.  Second, she has a history of nasal congestion and sneezing without any anosmia or ugly nasal discharge or recurrent headaches. She does have a headache about once every week that is located in the front part of her head in the back part of  her head the last about 3 minutes and is pounding or constant but this does not appear to be an issue of daily headaches. There is no obvious provoking factor giving rise to these symptoms. She has tried nasal steroids in the past which has not really helped her very much.  Third, she has itchiness. She is itchy all over and has been so for several years. She takes Benadryl every day for this issue which does help. Occasionally she will notice red raised itchy lesions pop up about every 2 weeks or so and resolved without any hyperpigmentation or scar without any obvious precipitant or associated systemic or constitutional symptoms.   Past Medical History:  Diagnosis Date  . Anxiety   . Arthritis    hands, knees, hips  . Asthma   . Cancer ([place])   . Cervical stenosis (uterine cervix)   . Dyspnea on exertion   . GERD (gastroesophageal reflux disease)   . Headache(784.0)   . History of breast cancer    2010--  LEFT  s/p  mastectomy (in [place]) AND CHEMORADIATION--  NO RECURRENCE  . History of cervical dysplasia   . History of TB skin testing    AS TEEN--  [place]/ MEDS  . Hyperlipidemia   . Hypertension   . Malignant neoplasm of overlapping sites of left breast in female, estrogen receptor positive ([place]) 03/05/2013  . OSA (obstructive sleep apnea) moderate osa per study  09/2010   CPAP  , NOT USING ON REGULAR BASIS  . Pelvic pain in female   . Positive  H. pylori test    08-05-2013  . Refusal of blood transfusions as patient is Jehovah's Witness   . Seasonal allergies   . Uterine fibroid   . Wears glasses     Past Surgical History:  Procedure Laterality Date  . CARDIOVASCULAR STRESS TEST  10-29-2012   low risk perfusion study/  no significant reversibity/ ef 66%/  normal wall motion  . CERVICAL CONIZATION W/BX  2012   in  [place] N/A 05/14/2012   Procedure: LAPAROSCOPIC CHOLECYSTECTOMY;  Surgeon: Ralene Ok, MD;  Location: [facility];  Service: General;  Laterality: N/A;  .  COLONOSCOPY  2011   normal per patient - [place]  . DILATION AND CURETTAGE OF UTERUS  10/15/2011   Procedure: DILATATION AND CURETTAGE;  Surgeon: Melina Schools, MD;  Location: [facility];  Service: Gynecology;  Laterality: N/A;  Conization &  endocervical curettings  . EXAMINATION UNDER ANESTHESIA N/A 08/20/2013   Procedure: EXAM UNDER ANESTHESIA;  Surgeon: Margarette Asal, MD;  Location[facility];  Service: Gynecology;  Laterality: N/A;  . [place]   right  . LAPAROSCOPIC ASSISTED VAGINAL HYSTERECTOMY N/A 10/26/2014   Procedure: HYSTERECTOMY ABDOMINAL ;  Surgeon: Molli Posey, MD;  Location: [facility];  Service: Gynecology;  Laterality: N/A;  . MASTECTOMY Left 11/2008  in [place]   . SALPINGOOPHORECTOMY Bilateral 10/26/2014   Procedure: BILATERAL SALPINGO OOPHORECTOMY;  Surgeon: Molli Posey, MD;  Location: [facility];  Service: Gynecology;  Laterality: Bilateral;  . TRANSTHORACIC ECHOCARDIOGRAM  10-29-2012   mild lvh/  ef 60-65%/  grade II diastolic dysfunction/  trivial mr  &  tr    Allergies as of 11/27/2016      Reactions   Lisinopril-hydrochlorothiazide Itching   Adhesive [tape] Itching, Other (See Comments)   Redness   Fentanyl    GI upset and drowsiness   Hctz [hydrochlorothiazide] Itching   Latex Itching   Lisinopril Itching   Losartan Potassium Other (See Comments)   Makes her feel "bad"   Oxycodone    GI upset, drowsy      Medication List      albuterol 108 (90 Base) MCG/ACT inhaler Commonly known as:  PROVENTIL HFA;VENTOLIN HFA Inhale 2 puffs into the lungs every 4 (four) hours as needed for wheezing or shortness of breath.   CALCIUM 600 PO Take by mouth daily.   carvedilol 12.5 MG tablet Commonly known as:  COREG Take 1 tablet (12.5 mg total) by mouth 2 (two) times daily with a meal.   diphenhydrAMINE 25 mg capsule Commonly known as:  BENADRYL Take 25 mg by mouth every 6 (six) hours as needed for allergies.   famotidine 20  MG tablet Commonly known as:  PEPCID One at bedtime   fluticasone 110 MCG/ACT inhaler Commonly known as:  FLOVENT HFA Inhale 2 puffs into the lungs 2 (two) times daily. With spacer   furosemide 20 MG tablet Commonly known as:  LASIX Take 1 tablet (20 mg total) by mouth daily.   IBRANCE 100 MG capsule Generic drug:  palbociclib TAKE 1 CAPSULE BY MOUTH DAILY FOR 21 DAYS, THEN 7 DAYS REST. TAKE WHOLE WITH FOOD.   letrozole 2.5 MG tablet Commonly known as:  FEMARA Take 1 tablet (2.5 mg total) by mouth daily.   montelukast 10 MG tablet Commonly known as:  SINGULAIR Take 1 tablet (10 mg total) by mouth at bedtime.   pantoprazole 40 MG tablet Commonly known as:  PROTONIX Take 1 tablet (40 mg total) by mouth daily. Take 30-60 min before first meal of the day   promethazine 25 MG tablet Commonly known as:  PHENERGAN Take 1 tablet (25 mg total) by mouth every 6 (six) hours as needed for nausea or vomiting.   ranitidine 300 MG tablet Commonly known as:  ZANTAC Take 1 tablet (300 mg total) by mouth at bedtime.   Vitamin D3 5000 units Caps Take by mouth daily.      Review of systems negative except as noted in HPI / PMHx or noted below:  Review of Systems  Constitutional: Negative.   HENT: Negative.   Eyes: Negative.   Respiratory: Negative.   Cardiovascular: Negative.   Gastrointestinal: Negative.   Genitourinary: Negative.   Musculoskeletal: Negative.   Skin: Negative.   Neurological: Negative.   Endo/Heme/Allergies: Negative.   Psychiatric/Behavioral: Negative.     Family History  Problem Relation Age of Onset  . Breast cancer Mother   . Colon cancer Mother   . Hypotension Mother   . Asthma Mother   . Diabetes type II Mother   . Arthritis Mother   . Clotting disorder Mother   . Cancer Mother        breast/colon  . Mental illness Brother   . Heart disease Brother   . Cerebral palsy Daughter   . Emphysema Brother        never smoker  . Colon cancer  Maternal Aunt   . Cancer Maternal Aunt        colon  . Colon cancer Maternal Uncle   . Cancer Maternal Uncle        colon  . Esophageal cancer Neg Hx   . Rectal cancer Neg Hx   . Stomach cancer Neg Hx   . Thyroid disease Neg Hx     Social History   Social History  . Marital status: Single    Spouse name: N/A  . Number of children: 5  . Years of education: N/A   Occupational History  . Disabled    Social History Main Topics  . Smoking status: Former Smoker    Packs/day: 0.30    Years: 10.00    Types: Cigarettes    Quit date: 03/26/1988  . Smokeless tobacco: Never Used  . Alcohol use No  . Drug use: No  . Sexual activity: Not Currently   Other Topics Concern  . Not on file   Social History Narrative  . No narrative on file    Environmental and Social history  Lives in a apartment with a dry environment, no animals located inside the household, no carpeting in the bedroom, no plastic on the bed, no plastic on the pillow, and she is disabled.  Objective:   Vitals:   11/27/16 1828  BP: 130/80  Pulse: 72  Resp: 16   Height: 5\' 6"  (167.6 cm) Weight: 283 lb (128.4 kg)  Physical Exam  Constitutional: She is well-developed, well-nourished, and in no distress.  Throat clearing  HENT:  Head: Normocephalic. Head is without right periorbital erythema and without left periorbital erythema.  Right Ear: Tympanic membrane, external ear and ear canal normal.  Left Ear: Tympanic membrane, external ear and ear canal normal.  Nose: Mucosal edema present. No rhinorrhea.  Mouth/Throat: Oropharynx is clear and moist and mucous membranes are normal. No oropharyngeal exudate.  Eyes: Pupils are equal, round, and reactive to light. Conjunctivae and lids are normal.  Neck: Trachea normal. No tracheal deviation present.  No thyromegaly present.  Cardiovascular: Normal rate, regular rhythm, S1 normal, S2 normal and normal heart sounds.   No murmur heard. Pulmonary/Chest: Effort  normal. No stridor. No tachypnea. No respiratory distress. She has no wheezes. She has no rales. She exhibits no tenderness.  Abdominal: Soft. She exhibits no distension and no mass. There is no hepatosplenomegaly. There is no tenderness. There is no rebound and no guarding.  Musculoskeletal: She exhibits edema (pitting edema lower extremities). She exhibits no tenderness.  Lymphadenopathy:       Head (right side): No tonsillar adenopathy present.       Head (left side): No tonsillar adenopathy present.    She has no cervical adenopathy.    She has no axillary adenopathy.  Neurological: She is alert. Gait normal.  Skin: No rash noted. She is not diaphoretic. No erythema. No pallor. Nails show no clubbing.  Psychiatric: Mood and affect normal.    Diagnostics: Allergy skin tests were performed. She demonstrated hypersensitivity against grasses, weeds, trees, molds, and house dust mite.  Spirometry was performed and demonstrated an FEV1 of 2.15 @ 96 % of predicted. Following the administration of nebulized albuterol her FEV1 did not change significantly.  Results of blood tests obtained 09/27/2016 identified a white blood cell count of 3.0 with an absolute eosinophil count of 200, hemoglobin 11.1, platelet 185.  Results of blood tests obtained 09/12/2016 identified a aero allergen profile identifying a total IgE level of 1082 KU/ML and multiple allergens specific IgE antibodies directed against various aeroallergens including cockroach and molds and pollens and dust mite  Results of a chest CT angiogram obtained 07/04/2016 identified the following:  Cardiovascular: Pulmonary arterial opacification is adequate without evidence of emboli. The thoracic aorta is normal in caliber. Trace pericardial fluid is noted anteriorly.  Mediastinum/Nodes: Prior left mastectomy and left axillary dissection with left breast implant in place. No enlarged axillary, mediastinal, or hilar lymph nodes. Grossly  unremarkable thyroid, trachea, and esophagus.  Lungs/Pleura: No pleural effusion or pneumothorax. Mild respiratory motion artifact with minimal dependent atelectasis in both lower lobes. No mass.  Results of a head CT scan obtained 02/02/2016 identified the following:  Sinuses/Orbits: The globes and orbital soft tissues within normal limits. Mild scattered mucosal thickening within the sphenoid sinuses, ethmoidal air cells, and maxillary sinuses. No air-fluid level to suggest active sinus infection. No mastoid effusion.  Results of a chest x-ray obtained 09/12/2016 identified the following:  Upper normal heart size. Mediastinal contours and pulmonary vascularity normal. Minimal bronchitic changes. Lungs otherwise clear. No infiltrate, pleural effusion or pneumothorax.   Assessment and Plan:    1. Other allergic rhinitis   2. Asthma, well controlled, mild persistent   3. LPRD (laryngopharyngeal reflux disease)   4. Allergic urticaria     1. Allergen avoidance measures  2. Treat and prevent inflammation:   A. OTC Nasacort - 1 spray each nostril one time per day  B. Flovent 110 - 2 inhalations one time per day with spacer  C. montelukast 10 mg - one tablet one time per day  3. Treat and prevent reflux:   A. consolidate all chocolate and caffeine consumption  B. pantoprazole 40 mg tablet in a.m.  C. ranitidine 300 mg tablet in PM  4. If needed:   A. Claritin 10 mg tablet 1-2 times per day  B. OTC Benadryl  C. Proventil HFA or similar 2 inhalations every 4-6 hours  5. 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 In addition, she does have a component of reflux-induced respiratory disease which I will address  with the therapy noted above. I will see her back in this clinic in 4 weeks or earlier should there be a problem during the interval.  Jiles Prows, MD Allergy / Immunology [facility]

## 2016-12-03 ENCOUNTER — Encounter (INDEPENDENT_AMBULATORY_CARE_PROVIDER_SITE_OTHER): Payer: Self-pay | Admitting: Physician Assistant

## 2016-12-03 ENCOUNTER — Ambulatory Visit (INDEPENDENT_AMBULATORY_CARE_PROVIDER_SITE_OTHER): Payer: Medicare Other | Admitting: Physician Assistant

## 2016-12-03 VITALS — BP 156/83 | HR 55 | Temp 97.9°F | Resp 18 | Ht 67.0 in | Wt 277.0 lb

## 2016-12-03 DIAGNOSIS — R109 Unspecified abdominal pain: Secondary | ICD-10-CM | POA: Diagnosis not present

## 2016-12-03 DIAGNOSIS — N952 Postmenopausal atrophic vaginitis: Secondary | ICD-10-CM | POA: Diagnosis not present

## 2016-12-03 DIAGNOSIS — I10 Essential (primary) hypertension: Secondary | ICD-10-CM

## 2016-12-03 DIAGNOSIS — Z114 Encounter for screening for human immunodeficiency virus [HIV]: Secondary | ICD-10-CM

## 2016-12-03 DIAGNOSIS — Z1159 Encounter for screening for other viral diseases: Secondary | ICD-10-CM

## 2016-12-03 LAB — POCT URINALYSIS DIPSTICK
Glucose, UA: NEGATIVE
Ketones, UA: NEGATIVE
LEUKOCYTES UA: NEGATIVE
Nitrite, UA: NEGATIVE
PH UA: 5.5 (ref 5.0–8.0)
RBC UA: NEGATIVE
Spec Grav, UA: >=1.03 — AB (ref 1.010–1.025)
Urobilinogen, UA: 1 E.U./dL

## 2016-12-03 MED ORDER — CARVEDILOL 6.25 MG PO TABS: 6.2500 mg | ORAL_TABLET | Freq: Two times a day (BID) | ORAL | 5 refills | Status: DC

## 2016-12-03 MED ORDER — AMLODIPINE BESYLATE 10 MG PO TABS: 10.0000 mg | ORAL_TABLET | Freq: Every day | ORAL | 5 refills | Status: DC

## 2016-12-03 MED ORDER — FUROSEMIDE 20 MG PO TABS: 20.0000 mg | ORAL_TABLET | Freq: Every day | ORAL | 5 refills | Status: DC

## 2016-12-03 NOTE — Progress Notes (Signed)
Subjective:  Patient ID: Michaela Little, female    DOB: 01/05/1961  Age: 56 y.o. MRN: [id number]  CC: HTN  HPI  [name] is a 56 y.o. female with multiple comorbidities including breast cancer presents for f/u of HTN and LE edema. Last seen here 08/01/16. BP at the time was 106/72, HR 81.  Has been taking carvedilol 12.5 mg BID. Ran out of Furosemide 20 mg. BP today 156/83. Feels generally well except for chronic right flank pain.     Complains of right flank pain attributed to prior cholecystectomy. Prior CT 5 months ago revealed a stable 35m hypodensity in the hepatic dome. Believes "something went wrong" with the cholecystectomy. Says there is similar pain in the left flank occasionally. Patient was seen here on 08/01/16 with the same complaint. Was treated for a UTI and referred to GYN for suspected atrophic vaginitis. GYN found BV and atrophic vaginitis. Refrained from using HRT 2/2 hx of BRCA. GYN treated BV with abx and discharged.      Outpatient Medications Prior to Visit  Medication Sig Dispense Refill  . Calcium Carbonate (CALCIUM 600 PO) Take by mouth daily.    . carvedilol (COREG) 12.5 MG tablet Take 1 tablet (12.5 mg total) by mouth 2 (two) times daily with a meal. 90 tablet 1  . Cholecalciferol (VITAMIN D3) 5000 units CAPS Take by mouth daily.    . diphenhydrAMINE (BENADRYL) 25 mg capsule Take 25 mg by mouth every 6 (six) hours as needed for allergies.    . famotidine (PEPCID) 20 MG tablet One at bedtime 30 tablet 2  . fluticasone (FLOVENT HFA) 110 MCG/ACT inhaler Inhale 2 puffs into the lungs 2 (two) times daily. With spacer 1 Inhaler 5  . IBRANCE 100 MG capsule TAKE 1 CAPSULE BY MOUTH DAILY FOR 21 DAYS, THEN 7 DAYS REST. TAKE WHOLE WITH FOOD. 21 capsule 0  . letrozole (FEMARA) 2.5 MG tablet Take 1 tablet (2.5 mg total) by mouth daily. 90 tablet 4  . montelukast (SINGULAIR) 10 MG tablet Take 1 tablet (10 mg total) by mouth at bedtime. 30 tablet 5  . pantoprazole  (PROTONIX) 40 MG tablet Take 1 tablet (40 mg total) by mouth daily. Take 30-60 min before first meal of the day 30 tablet 2  . promethazine (PHENERGAN) 25 MG tablet Take 1 tablet (25 mg total) by mouth every 6 (six) hours as needed for nausea or vomiting. 120 tablet 0  . ranitidine (ZANTAC) 300 MG tablet Take 1 tablet (300 mg total) by mouth at bedtime. 30 tablet 5  . albuterol (PROVENTIL HFA;VENTOLIN HFA) 108 (90 Base) MCG/ACT inhaler Inhale 2 puffs into the lungs every 4 (four) hours as needed for wheezing or shortness of breath. 1 Inhaler 5  . furosemide (LASIX) 20 MG tablet Take 1 tablet (20 mg total) by mouth daily. (Patient not taking: Reported on 11/27/2016) 30 tablet 3   No facility-administered medications prior to visit.      ROS Review of Systems  Constitutional: Negative for chills, fever and malaise/fatigue.  Eyes: Negative for blurred vision.  Respiratory: Negative for shortness of breath.   Cardiovascular: Negative for chest pain and palpitations.  Gastrointestinal: Positive for abdominal pain. Negative for nausea.  Genitourinary: Negative for dysuria and hematuria.  Musculoskeletal: Negative for joint pain and myalgias.  Skin: Negative for rash.  Neurological: Negative for tingling and headaches.  Psychiatric/Behavioral: Negative for depression. The patient is not nervous/anxious.     Objective:  Resp 18  Ht 5' 7"  (1.702 m)   Wt 277 lb (125.6 kg)   LMP 02/25/2009   BMI 43.38 kg/m   BP/Weight 12/03/2016 05/25/5943 10/29/9290  Systolic BP - 446 286  Diastolic BP - 71 80  Wt. (Lbs) 277 - 283  BMI 43.38 - 45.68      Physical Exam  Constitutional: She is oriented to person, place, and time.  Well developed, obese, NAD, polite  HENT:  Head: Normocephalic and atraumatic.  Eyes: No scleral icterus.  Cardiovascular: Normal rate, regular rhythm and normal heart sounds.   Pulmonary/Chest: Effort normal and breath sounds normal.  Abdominal: Soft. Bowel sounds are  normal. There is tenderness (right flank TTP).  Musculoskeletal: She exhibits no edema.  Neurological: She is alert and oriented to person, place, and time. No cranial nerve deficit. Coordination normal.  Skin: Skin is warm and dry. No rash noted. No erythema. No pallor.  No lesions, erythema, or ecchymosis in the right flank  Psychiatric: She has a normal mood and affect. Her behavior is normal. Thought content normal.  Vitals reviewed.    Assessment & Plan:    1. Hypertension, unspecified type - Refill furosemide (LASIX) 20 MG tablet; Take 1 tablet (20 mg total) by mouth daily.  Dispense: 30 tablet; Refill: 5 - Decrease carvedilol (COREG) 6.25 MG tablet; Take 1 tablet (6.25 mg total) by mouth 2 (two) times daily with a meal.  Dispense: 30 tablet; Refill: 5 - Begin amLODipine (NORVASC) 10 MG tablet; Take 1 tablet (10 mg total) by mouth daily.  Dispense: 30 tablet; Refill: 5  2. Atrophic vaginitis - Confirmed by GYN. Advise against HRT 2/2 breast cancer hx. Use abx as necessary.  3. Right flank pain - Urinalysis Dipstick negative in clinic today - CBC with Differential - Comprehensive metabolic panel - Lipase  4. Need for hepatitis C screening test - Hepatitis c antibody (reflex)  5. Encounter for screening for HIV - HIV antibody  6. Need for prophylactic vaccination and inoculation against influenza - Pt declined flu vaccine today.    Meds ordered this encounter  Medications  . furosemide (LASIX) 20 MG tablet    Sig: Take 1 tablet (20 mg total) by mouth daily.    Dispense:  30 tablet    Refill:  5    Order Specific Question:   Supervising Provider    Answer:   Tresa Garter [id number]  . carvedilol (COREG) 6.25 MG tablet    Sig: Take 1 tablet (6.25 mg total) by mouth 2 (two) times daily with a meal.    Dispense:  30 tablet    Refill:  5    Order Specific Question:   Supervising Provider    Answer:   Tresa Garter [id number]  . amLODipine (NORVASC) 10  MG tablet    Sig: Take 1 tablet (10 mg total) by mouth daily.    Dispense:  30 tablet    Refill:  5    If not covered by insurance, please send request for alternative CCB that is covered. Thank you.    Order Specific Question:   Supervising Provider    Answer:   Tresa Garter [[id number]]    Follow-up: Return in about 2 weeks (around 12/17/2016) for HTN.   Clent Demark PA

## 2016-12-03 NOTE — Patient Instructions (Signed)
Atrophic Vaginitis Atrophic vaginitis is a condition in which the tissues that line the vagina become dry and thin. This condition is most common in women who have stopped having regular menstrual periods (menopause). This usually starts when a woman is 45-58 years old. Estrogen helps to keep the vagina moist. It stimulates the vagina to produce a clear fluid that lubricates the vagina for sexual intercourse. This fluid also protects the vagina from infection. Lack of estrogen can cause the lining of the vagina to get thinner and dryer. The vagina may also shrink in size. It may become less elastic. Atrophic vaginitis tends to get worse over time as a woman's estrogen level drops. What are the causes? This condition is caused by the normal drop in estrogen that happens around the time of menopause. What increases the risk? Certain conditions or situations may lower a woman's estrogen level, which increases her risk of atrophic vaginitis. These include:  Taking medicine that blocks estrogen.  Having ovaries removed surgically.  Being treated for cancer with X-ray treatment (radiation) or medicines (chemotherapy).  Exercising very hard and often.  Having an eating disorder (anorexia).  Giving birth or breastfeeding.  Being over the age of 50.  Smoking.  What are the signs or symptoms? Symptoms of this condition include:  Pain, soreness, or bleeding during sexual intercourse (dyspareunia).  Vaginal burning, irritation, or itching.  Pain or bleeding during a vaginal examination using a speculum (pelvic exam).  Loss of interest in sexual activity.  Having burning pain when passing urine.  Vaginal discharge that is brown or yellow.  In some cases, there are no symptoms. How is this diagnosed? This condition is diagnosed with a medical history and physical exam. This will include a pelvic exam that checks whether the inside of your vagina appears pale, thin, or dry. Rarely, you may  also have other tests, including:  A urine test.  A test that checks the acid balance in your vaginal fluid (acid balance test).  How is this treated? Treatment for this condition may depend on the severity of your symptoms. Treatment may include:  Using an over-the-counter vaginal lubricant before you have sexual intercourse.  Using a long-acting vaginal moisturizer.  Using low-dose vaginal estrogen for moderate to severe symptoms that do not respond to other treatments. Options include creams, tablets, and inserts (vaginal rings). Before using vaginal estrogen, tell your health care provider if you have a history of: ? Breast cancer. ? Endometrial cancer. ? Blood clots.  Taking medicines. You may be able to take a daily pill for dyspareunia. Discuss all of the risks of this medicine with your health care provider. It is usually not recommended for women who have a family history or personal history of breast cancer.  If your symptoms are very mild and you are not sexually active, you may not need treatment. Follow these instructions at home:  Take medicines only as directed by your health care provider. Do not use herbal or alternative medicines unless your health care provider says that you can.  Use over-the-counter creams, lubricants, or moisturizers for dryness only as directed by your health care provider.  If your atrophic vaginitis is caused by menopause, discuss all of your menopausal symptoms and treatment options with your health care provider.  Do not douche.  Do not use products that can make your vagina dry. These include: ? Scented feminine sprays. ? Scented tampons. ? Scented soaps.  If it hurts to have sex, talk with your sexual   partner. Contact a health care provider if:  Your discharge looks different than normal.  Your vagina has an unusual smell.  You have new symptoms.  Your symptoms do not improve with treatment.  Your symptoms get worse. This  information is not intended to replace advice given to you by your health care provider. Make sure you discuss any questions you have with your health care provider. Document Released: 07/27/2014 Document Revised: 08/18/2015 Document Reviewed: 03/03/2014 Elsevier Interactive Patient Education  2018 [employer]  

## 2016-12-04 LAB — CBC WITH DIFFERENTIAL/PLATELET
BASOS ABS: 0 10*3/uL (ref 0.0–0.2)
BASOS: 1 %
EOS (ABSOLUTE): 0.2 10*3/uL (ref 0.0–0.4)
EOS: 7 %
HEMATOCRIT: 35.5 % (ref 34.0–46.6)
HEMOGLOBIN: 11.7 g/dL (ref 11.1–15.9)
IMMATURE GRANS (ABS): 0 10*3/uL (ref 0.0–0.1)
Immature Granulocytes: 0 %
LYMPHS ABS: 0.9 10*3/uL (ref 0.7–3.1)
LYMPHS: 37 %
MCH: 30.5 pg (ref 26.6–33.0)
MCHC: 33 g/dL (ref 31.5–35.7)
MCV: 92 fL (ref 79–97)
MONOCYTES: 4 %
Monocytes Absolute: 0.1 10*3/uL (ref 0.1–0.9)
NEUTROS ABS: 1.3 10*3/uL — AB (ref 1.4–7.0)
Neutrophils: 51 %
Platelets: 231 10*3/uL (ref 150–379)
RBC: 3.84 x10E6/uL (ref 3.77–5.28)
RDW: 16.5 % — ABNORMAL HIGH (ref 12.3–15.4)
WBC: 2.5 10*3/uL — CL (ref 3.4–10.8)

## 2016-12-04 LAB — COMPREHENSIVE METABOLIC PANEL
A/G RATIO: 1.5 (ref 1.2–2.2)
ALBUMIN: 4.1 g/dL (ref 3.5–5.5)
ALK PHOS: 85 IU/L (ref 39–117)
ALT: 12 IU/L (ref 0–32)
AST: 16 IU/L (ref 0–40)
BILIRUBIN TOTAL: 0.8 mg/dL (ref 0.0–1.2)
BUN / CREAT RATIO: 14 (ref 9–23)
BUN: 13 mg/dL (ref 6–24)
CHLORIDE: 103 mmol/L (ref 96–106)
CO2: 20 mmol/L (ref 20–29)
Calcium: 8.9 mg/dL (ref 8.7–10.2)
Creatinine, Ser: 0.91 mg/dL (ref 0.57–1.00)
GFR calc non Af Amer: 70 mL/min/{1.73_m2} (ref >59)
GFR, EST AFRICAN AMERICAN: 80 mL/min/{1.73_m2} (ref >59)
GLOBULIN, TOTAL: 2.7 g/dL (ref 1.5–4.5)
Glucose: 108 mg/dL — ABNORMAL HIGH (ref 65–99)
Potassium: 4.1 mmol/L (ref 3.5–5.2)
SODIUM: 142 mmol/L (ref 134–144)
TOTAL PROTEIN: 6.8 g/dL (ref 6.0–8.5)

## 2016-12-04 LAB — HEPATITIS C ANTIBODY (REFLEX): HCV Ab: <0.1 s/co ratio (ref 0.0–0.9)

## 2016-12-04 LAB — LIPASE: LIPASE: 15 U/L (ref 14–72)

## 2016-12-04 LAB — HIV ANTIBODY (ROUTINE TESTING W REFLEX): HIV Screen 4th Generation wRfx: NONREACTIVE

## 2016-12-04 LAB — HCV COMMENT:

## 2016-12-05 ENCOUNTER — Telehealth: Payer: Self-pay | Admitting: Allergy and Immunology

## 2016-12-05 ENCOUNTER — Other Ambulatory Visit: Payer: Self-pay

## 2016-12-05 MED ORDER — FLUTICASONE PROPIONATE HFA 110 MCG/ACT IN AERO: 2.0000 | INHALATION_SPRAY | Freq: Two times a day (BID) | RESPIRATORY_TRACT | 5 refills | Status: DC

## 2016-12-05 MED ORDER — RANITIDINE HCL 300 MG PO TABS: 300.0000 mg | ORAL_TABLET | Freq: Every day | ORAL | 5 refills | Status: DC

## 2016-12-05 NOTE — Telephone Encounter (Signed)
Called and spoke with patient and informed her that I have sent in her refills to the pharmacy she requested.

## 2016-12-05 NOTE — Telephone Encounter (Signed)
Patient needs a refill on FLOVENT and RANITIDINE called into [facility]

## 2016-12-06 ENCOUNTER — Telehealth: Payer: Self-pay

## 2016-12-06 ENCOUNTER — Other Ambulatory Visit: Payer: Self-pay

## 2016-12-06 MED ORDER — PALBOCICLIB 75 MG PO CAPS: 75.0000 mg | ORAL_CAPSULE | Freq: Every day | ORAL | 0 refills | Status: DC

## 2016-12-06 NOTE — Telephone Encounter (Signed)
New prescription sent to [facility] for 75mg  daily per Dr Jana Hakim

## 2016-12-06 NOTE — Telephone Encounter (Signed)
Michaela Little called for ibrance refill

## 2016-12-12 ENCOUNTER — Other Ambulatory Visit: Payer: Medicare Other

## 2016-12-12 ENCOUNTER — Emergency Department (HOSPITAL_COMMUNITY)
Admission: EM | Admit: 2016-12-12 | Discharge: 2016-12-12 | Disposition: A | Discharge status: Home / Self Care | Payer: Medicare Other | Attending: Emergency Medicine | Admitting: Emergency Medicine

## 2016-12-12 ENCOUNTER — Ambulatory Visit: Payer: Medicare Other

## 2016-12-12 ENCOUNTER — Encounter (HOSPITAL_COMMUNITY): Payer: Self-pay | Admitting: Emergency Medicine

## 2016-12-12 ENCOUNTER — Emergency Department (HOSPITAL_COMMUNITY): Payer: Medicare Other

## 2016-12-12 DIAGNOSIS — Z853 Personal history of malignant neoplasm of breast: Secondary | ICD-10-CM | POA: Insufficient documentation

## 2016-12-12 DIAGNOSIS — R1084 Generalized abdominal pain: Secondary | ICD-10-CM | POA: Diagnosis not present

## 2016-12-12 DIAGNOSIS — Z87891 Personal history of nicotine dependence: Secondary | ICD-10-CM | POA: Diagnosis not present

## 2016-12-12 DIAGNOSIS — M545 Low back pain: Secondary | ICD-10-CM | POA: Insufficient documentation

## 2016-12-12 DIAGNOSIS — J45909 Unspecified asthma, uncomplicated: Secondary | ICD-10-CM | POA: Diagnosis not present

## 2016-12-12 DIAGNOSIS — Z79899 Other long term (current) drug therapy: Secondary | ICD-10-CM | POA: Insufficient documentation

## 2016-12-12 DIAGNOSIS — R0602 Shortness of breath: Secondary | ICD-10-CM | POA: Diagnosis not present

## 2016-12-12 DIAGNOSIS — R110 Nausea: Secondary | ICD-10-CM | POA: Insufficient documentation

## 2016-12-12 DIAGNOSIS — Z85830 Personal history of malignant neoplasm of bone: Secondary | ICD-10-CM | POA: Insufficient documentation

## 2016-12-12 DIAGNOSIS — I10 Essential (primary) hypertension: Secondary | ICD-10-CM | POA: Diagnosis not present

## 2016-12-12 DIAGNOSIS — Z9012 Acquired absence of left breast and nipple: Secondary | ICD-10-CM | POA: Insufficient documentation

## 2016-12-12 DIAGNOSIS — M25511 Pain in right shoulder: Secondary | ICD-10-CM | POA: Diagnosis not present

## 2016-12-12 DIAGNOSIS — R109 Unspecified abdominal pain: Secondary | ICD-10-CM | POA: Insufficient documentation

## 2016-12-12 LAB — CBC WITH DIFFERENTIAL/PLATELET
BASOS ABS: 0 10*3/uL (ref 0.0–0.1)
Basophils Relative: 1 %
Eosinophils Absolute: 0.2 10*3/uL (ref 0.0–0.7)
Eosinophils Relative: 6 %
HCT: 36.1 % (ref 36.0–46.0)
Hemoglobin: 12.3 g/dL (ref 12.0–15.0)
LYMPHS ABS: 1.1 10*3/uL (ref 0.7–4.0)
Lymphocytes Relative: 37 %
MCH: 30.2 pg (ref 26.0–34.0)
MCHC: 34.1 g/dL (ref 30.0–36.0)
MCV: 88.7 fL (ref 78.0–100.0)
MONOS PCT: 9 %
Monocytes Absolute: 0.3 10*3/uL (ref 0.1–1.0)
NEUTROS ABS: 1.4 10*3/uL — AB (ref 1.7–7.7)
NEUTROS PCT: 47 %
PLATELETS: 188 10*3/uL (ref 150–400)
RBC: 4.07 MIL/uL (ref 3.87–5.11)
RDW: 14.4 % (ref 11.5–15.5)
WBC: 2.9 10*3/uL — ABNORMAL LOW (ref 4.0–10.5)

## 2016-12-12 LAB — LIPASE, BLOOD: Lipase: 19 U/L (ref 11–51)

## 2016-12-12 LAB — URINALYSIS, ROUTINE W REFLEX MICROSCOPIC
BILIRUBIN URINE: NEGATIVE
Bacteria, UA: NONE SEEN
GLUCOSE, UA: NEGATIVE mg/dL
Hgb urine dipstick: NEGATIVE
KETONES UR: NEGATIVE mg/dL
Nitrite: NEGATIVE
PH: 6 (ref 5.0–8.0)
Protein, ur: NEGATIVE mg/dL
SPECIFIC GRAVITY, URINE: 1.017 (ref 1.005–1.030)

## 2016-12-12 LAB — COMPREHENSIVE METABOLIC PANEL
ALBUMIN: 4.4 g/dL (ref 3.5–5.0)
ALK PHOS: 84 U/L (ref 38–126)
ALT: 12 U/L — ABNORMAL LOW (ref 14–54)
ANION GAP: 7 (ref 5–15)
AST: 18 U/L (ref 15–41)
BUN: 13 mg/dL (ref 6–20)
CALCIUM: 9.6 mg/dL (ref 8.9–10.3)
CO2: 27 mmol/L (ref 22–32)
Chloride: 103 mmol/L (ref 101–111)
Creatinine, Ser: 0.74 mg/dL (ref 0.44–1.00)
GFR calc Af Amer: >60 mL/min (ref >60)
GFR calc non Af Amer: >60 mL/min (ref >60)
GLUCOSE: 104 mg/dL — AB (ref 65–99)
POTASSIUM: 4.2 mmol/L (ref 3.5–5.1)
SODIUM: 137 mmol/L (ref 135–145)
Total Bilirubin: 1.1 mg/dL (ref 0.3–1.2)
Total Protein: 7.7 g/dL (ref 6.5–8.1)

## 2016-12-12 LAB — BRAIN NATRIURETIC PEPTIDE: B Natriuretic Peptide: 7.1 pg/mL (ref 0.0–100.0)

## 2016-12-12 MED ORDER — PREDNISONE 50 MG PO TABS
50.0000 mg | ORAL_TABLET | Freq: Once | ORAL | Status: AC
  Administered 2016-12-12: 50 mg via ORAL
  Filled 2016-12-12: qty 1

## 2016-12-12 MED ORDER — ACETAMINOPHEN 325 MG PO TABS
650.0000 mg | ORAL_TABLET | Freq: Once | ORAL | Status: AC
  Administered 2016-12-12: 650 mg via ORAL
  Filled 2016-12-12: qty 2

## 2016-12-12 MED ORDER — IOPAMIDOL (ISOVUE-370) INJECTION 76%
100.0000 mL | Freq: Once | INTRAVENOUS | Status: AC | PRN
  Administered 2016-12-12: 100 mL via INTRAVENOUS

## 2016-12-12 MED ORDER — PREDNISONE 50 MG PO TABS: 50.0000 mg | ORAL_TABLET | Freq: Every day | ORAL | 0 refills | Status: AC

## 2016-12-12 MED ORDER — METHOCARBAMOL 500 MG PO TABS: 500.0000 mg | ORAL_TABLET | Freq: Four times a day (QID) | ORAL | 0 refills | Status: DC

## 2016-12-12 MED ORDER — IOPAMIDOL (ISOVUE-370) INJECTION 76%
INTRAVENOUS | Status: AC
  Administered 2016-12-12: 14:00:00
  Filled 2016-12-12: qty 100

## 2016-12-12 NOTE — Discharge Instructions (Signed)
You were evaluated in the emergency room for worsening right sided flank pain. Your blood work was reassuring that you do not have a problem with your liver, kidneys, or pancreas. We did not see evidence of a urinary tract infection or signs of an infection in your kidneys. Your CT scan did not reveal any new abnormalities or blood clots.   You were given a prescription for prednisone, 50 mg. Please take 1 tablet daily for the next 4 days. You were also given an pain medicine, Robaxin, to help with your right sided back and flank pain, which we believe to be related to the nerves and muscles in your back. You will need to follow up with your primary care physician regarding your visit to the ED and these medications

## 2016-12-12 NOTE — ED Triage Notes (Signed)
Per EMS, pt presents with right flank pain x2 days radiating rt shoulder right groin. States she has urinary frequency and recurring UTIs with mild N/V. Hx of breast cancer (left mastectomy).

## 2016-12-12 NOTE — ED Notes (Signed)
Bed: WA03 Expected date:  Expected time:  Means of arrival:  Comments: 

## 2016-12-12 NOTE — ED Notes (Signed)
Patient transported to CT 

## 2016-12-12 NOTE — ED Provider Notes (Signed)
[facility] DEPT Provider Note   CSN: [id number] Arrival date & time: 12/12/16  1021   History   Chief Complaint Chief Complaint  Patient presents with  . Flank Pain    HPI Michaela Little is a 56 y.o. female.  Patient with PMH of HTN, GERD, breast cancer (L mastectomy, chemo/rad completed in 2011, with bony mets confirmed in 2017) who is presenting for evaluation of right sided flank pain. She has been seen in the clinic with this specific chief complaint on two occasions, with her most recent evaluation on 12/03/16. At that time she had a CMP, lipase, and urinalysis within normal limits. She states that since her clinic visit her pain has worsened and she can no longer tolerate moving around. She states the pain starts on her right side and moves to the R shoulder. Patient states she has chronic nausea, but that this has gotten acutely worse over the past week. She last vomited two days ago. She denies hematemesis, dysuria, hematuria, constipation, blood in stool, and fevers.      Past Medical History:  Diagnosis Date  . Anxiety   . Arthritis    hands, knees, hips  . Asthma   . Cancer ([place])   . Cervical stenosis (uterine cervix)   . Dyspnea on exertion   . GERD (gastroesophageal reflux disease)   . Headache(784.0)   . History of breast cancer    2010--  LEFT  s/p  mastectomy (in [place]) AND CHEMORADIATION--  NO RECURRENCE  . History of cervical dysplasia   . History of TB skin testing    AS TEEN--  [place]/ MEDS  . Hyperlipidemia   . Hypertension   . Malignant neoplasm of overlapping sites of left breast in female, estrogen receptor positive ([place]) 03/05/2013  . OSA (obstructive sleep apnea) moderate osa per study  09/2010   CPAP  , NOT USING ON REGULAR BASIS  . Pelvic pain in female   . Positive H. pylori test    08-05-2013  . Refusal of blood transfusions as patient is Jehovah's Witness   . Seasonal allergies   . Uterine fibroid   . Wears glasses     Patient Active  Problem List   Diagnosis Date Noted  . Multinodular thyroid 02/08/2016  . Lumbar back pain with radiculopathy affecting left lower extremity 02/08/2016  . Carcinoma of breast metastatic to bone ([place]) 02/08/2016  . Bone metastases ([place]) 11/13/2015  . Right thyroid nodule 06/30/2014  . Allergic rhinitis due to pollen 06/30/2014  . Essential hypertension 12/24/2013  . Left ventricular diastolic dysfunction with preserved systolic function 36/64/4034  . Lump in neck 08/11/2013  . Abdominal pain, chronic, bilateral lower quadrant 08/05/2013  . Abdominal pain, epigastric 08/05/2013  . Morbid (severe) obesity due to excess calories ([place]) 06/23/2013  . Allergic rhinitis 06/23/2013  . GERD (gastroesophageal reflux disease) 03/25/2013  . Constipation 03/25/2013  . Other and unspecified hyperlipidemia 03/25/2013  . Dysuria 03/25/2013  . Routine general medical examination at a health care facility 03/25/2013  . Malignant neoplasm of overlapping sites of left breast in female, estrogen receptor positive ([place]) 03/05/2013  . Cough 10/28/2012  . Dyspnea on exertion 10/28/2012  . Chronic abdominal pain 09/02/2012  . Pelvic pain in female 02/06/2012  . Atypical chest pain 11/13/2011  . Hyperlipidemia 11/13/2011  . Dysplasia of cervix, low grade (CIN 1) 08/22/2011  . Diastolic dysfunction 74/25/9563  . Influenza 03/04/2011  . Hypertension 03/04/2011  . Obesity 03/04/2011  . OSA (obstructive  sleep apnea) 02/27/2011  . Asthma 02/08/2011    Past Surgical History:  Procedure Laterality Date  . CARDIOVASCULAR STRESS TEST  10-29-2012   low risk perfusion study/  no significant reversibity/ ef 66%/  normal wall motion  . CERVICAL CONIZATION W/BX  2012   in  [place] N/A 05/14/2012   Procedure: LAPAROSCOPIC CHOLECYSTECTOMY;  Surgeon: Ralene Ok, MD;  Location: [facility];  Service: General;  Laterality: N/A;  . COLONOSCOPY  2011   normal per patient - [place]  . DILATION AND CURETTAGE OF  UTERUS  10/15/2011   Procedure: DILATATION AND CURETTAGE;  Surgeon: Melina Schools, MD;  Location: [facility];  Service: Gynecology;  Laterality: N/A;  Conization &  endocervical curettings  . EXAMINATION UNDER ANESTHESIA N/A 08/20/2013   Procedure: EXAM UNDER ANESTHESIA;  Surgeon: Margarette Asal, MD;  Location: [facility];  Service: Gynecology;  Laterality: N/A;  . [place]   right  . LAPAROSCOPIC ASSISTED VAGINAL HYSTERECTOMY N/A 10/26/2014   Procedure: HYSTERECTOMY ABDOMINAL ;  Surgeon: Molli Posey, MD;  Location: [facility];  Service: Gynecology;  Laterality: N/A;  . MASTECTOMY Left 11/2008  in [place]   . SALPINGOOPHORECTOMY Bilateral 10/26/2014   Procedure: BILATERAL SALPINGO OOPHORECTOMY;  Surgeon: Molli Posey, MD;  Location: [facility];  Service: Gynecology;  Laterality: Bilateral;  . TRANSTHORACIC ECHOCARDIOGRAM  10-29-2012   mild lvh/  ef 60-65%/  grade II diastolic dysfunction/  trivial mr  &  tr    OB History    Gravida Para Term Preterm AB Living   10 5 5   5 5    SAB TAB Ectopic Multiple Live Births   3 2             Home Medications    Prior to Admission medications   Medication Sig Start Date End Date Taking? Authorizing Provider  albuterol (PROVENTIL HFA;VENTOLIN HFA) 108 (90 Base) MCG/ACT inhaler Inhale 2 puffs into the lungs every 6 (six) hours as needed for wheezing or shortness of breath.   Yes [provider]  amLODipine (NORVASC) 10 MG tablet Take 1 tablet (10 mg total) by mouth daily. 12/03/16  Yes Clent Demark, PA-C  BLACK CURRANT SEED OIL PO Take 15 mLs by mouth daily as needed (inflammation).   Yes [provider]  Calcium Carbonate (CALCIUM 600 PO) Take 1 tablet by mouth daily.    Yes [provider]  carvedilol (COREG) 6.25 MG tablet Take 1 tablet (6.25 mg total) by mouth 2 (two) times daily with a meal. 12/03/16  Yes Clent Demark, PA-C  Cholecalciferol (VITAMIN D3) 5000 units CAPS  Take 1 capsule by mouth daily.    Yes [provider]  diphenhydrAMINE (BENADRYL) 25 mg capsule Take 25 mg by mouth every 6 (six) hours as needed for allergies.   Yes [provider]  famotidine (PEPCID) 20 MG tablet One at bedtime Patient taking differently: Take 20 mg by mouth at bedtime. One at bedtime 09/12/16  Yes [name], MD  furosemide (LASIX) 20 MG tablet Take 1 tablet (20 mg total) by mouth daily. 12/03/16  Yes Clent Demark, PA-C  letrozole [facility]) 2.5 MG tablet Take 1 tablet (2.5 mg total) by mouth daily. 03/07/16  Yes Magrinat, Virgie Dad, MD  promethazine (PHENERGAN) 25 MG tablet Take 1 tablet (25 mg total) by mouth every 6 (six) hours as needed for nausea or vomiting. 01/17/16  Yes Eulas Post,  Monica, DO  albuterol (PROVENTIL HFA;VENTOLIN HFA) 108 (90 Base) MCG/ACT inhaler Inhale 2 puffs into the lungs every 4 (four) hours as needed for wheezing or shortness of breath. 08/01/16 11/27/16  Clent Demark, PA-C  fluticasone (FLOVENT HFA) 110 MCG/ACT inhaler Inhale 2 puffs into the lungs 2 (two) times daily. With spacer 12/05/16   Kozlow, Donnamarie Poag, MD  methocarbamol (ROBAXIN) 500 MG tablet Take 1 tablet (500 mg total) by mouth 4 (four) times daily. 12/12/16   Thomasene Ripple, MD  montelukast (SINGULAIR) 10 MG tablet Take 1 tablet (10 mg total) by mouth at bedtime. 11/27/16   Kozlow, Donnamarie Poag, MD  palbociclib [name]) 75 MG capsule Take 1 capsule (75 mg total) by mouth daily. Take whole with food. Patient taking differently: Take 75 mg by mouth daily. Take whole with food. Take 1 tablet daily for 21 days then Take stop for 7 Days. Then Repeat Regimen. 12/06/16   Magrinat, Virgie Dad, MD  pantoprazole (PROTONIX) 40 MG tablet Take 1 tablet (40 mg total) by mouth daily. Take 30-60 min before first meal of the day 09/12/16   [name], MD  predniSONE (DELTASONE) 50 MG tablet Take 1 tablet (50 mg total) by mouth daily. 12/12/16 12/16/16  Thomasene Ripple, MD  ranitidine (ZANTAC)  300 MG tablet Take 1 tablet (300 mg total) by mouth at bedtime. 12/05/16   Kozlow, Donnamarie Poag, MD    Family History Family History  Problem Relation Age of Onset  . Breast cancer Mother   . Colon cancer Mother   . Hypotension Mother   . Asthma Mother   . Diabetes type II Mother   . Arthritis Mother   . Clotting disorder Mother   . Cancer Mother        breast/colon  . Mental illness Brother   . Heart disease Brother   . Cerebral palsy Daughter   . Emphysema Brother        never smoker  . Colon cancer Maternal Aunt   . Cancer Maternal Aunt        colon  . Colon cancer Maternal Uncle   . Cancer Maternal Uncle        colon  . Esophageal cancer Neg Hx   . Rectal cancer Neg Hx   . Stomach cancer Neg Hx   . Thyroid disease Neg Hx     Social History Social History  Substance Use Topics  . Smoking status: Former Smoker    Packs/day: 0.30    Years: 10.00    Types: Cigarettes    Quit date: 03/26/1988  . Smokeless tobacco: Never Used  . Alcohol use No     Allergies   Lisinopril-hydrochlorothiazide; Adhesive [tape]; Fentanyl; Hctz [hydrochlorothiazide]; Latex; Lisinopril; Losartan potassium; and Oxycodone   Review of Systems Review of Systems  Constitutional: Negative for appetite change, chills, diaphoresis and fever.  HENT: Negative for congestion, sinus pain and sinus pressure.   Respiratory: Positive for shortness of breath. Negative for wheezing.   Cardiovascular: Positive for leg swelling (bilateral, chronic, not acutely worse than baseline). Negative for chest pain and palpitations.  Gastrointestinal: Positive for nausea (at baseline, acutely worse). Negative for abdominal pain, blood in stool, constipation, diarrhea and vomiting.  Genitourinary: Positive for flank pain. Negative for decreased urine volume, dysuria, frequency and urgency.  Musculoskeletal: Positive for back pain (R thoracic region) and myalgias.  Neurological: Negative for light-headedness and headaches.      Physical Exam Updated Vital Signs BP (!) 145/79  Pulse 66   Temp 98.5 F (36.9 C) (Oral)   Resp 14   Ht 5' 7"  (1.702 m)   Wt 125.6 kg (277 lb)   LMP 02/25/2009   SpO2 97%   BMI 43.38 kg/m   Physical Exam  Constitutional: She appears well-developed and well-nourished. No distress.  HENT:  Mouth/Throat: Oropharynx is clear and moist. No oropharyngeal exudate.  Cardiovascular: Normal rate, regular rhythm and intact distal pulses.  Exam reveals no friction rub.   No murmur heard. Pulmonary/Chest: Breath sounds normal. No respiratory distress. She has no wheezes. She exhibits tenderness.  Patient's participation limited by pain with placement of stethoscope on R mid back and pain with deep breaths.  Abdominal: Soft. She exhibits no distension. There is no tenderness. There is no guarding. No hernia.  Musculoskeletal: She exhibits edema (1+ pitting edema to knees bilaterally). She exhibits no tenderness (ligh palpation over R lower thoracic rib cage radiating around the flank) or deformity.  Lymphadenopathy:    She has no cervical adenopathy.  Skin: Skin is warm and dry. Capillary refill takes less than 2 seconds. No rash noted. No erythema.   ED Treatments / Results  Labs (all labs ordered are listed, but only abnormal results are displayed) Labs Reviewed  COMPREHENSIVE METABOLIC PANEL - Abnormal; Notable for the following:       Result Value   Glucose, Bld 104 (*)    ALT 12 (*)    All other components within normal limits  CBC WITH DIFFERENTIAL/PLATELET - Abnormal; Notable for the following:    WBC 2.9 (*)    Neutro Abs 1.4 (*)    All other components within normal limits  URINALYSIS, ROUTINE W REFLEX MICROSCOPIC - Abnormal; Notable for the following:    APPearance HAZY (*)    Leukocytes, UA MODERATE (*)    Squamous Epithelial / LPF 0-5 (*)    All other components within normal limits  LIPASE, BLOOD  BRAIN NATRIURETIC PEPTIDE    EKG  EKG Interpretation None        Radiology Ct Angio Chest Pe W/cm &/or Wo Cm  Result Date: 12/12/2016 CLINICAL DATA:  Shortness of breath. History of left breast cancer with mastectomy. Hypertension. Gastroesophageal reflux disease. EXAM: CT ANGIOGRAPHY CHEST WITH CONTRAST TECHNIQUE: Multidetector CT imaging of the chest was performed using the standard protocol during bolus administration of intravenous contrast. Multiplanar CT image reconstructions and MIPs were obtained to evaluate the vascular anatomy. CONTRAST:  100 cc of Isovue 370 COMPARISON:  07/03/2016 CT.  Plain films 09/12/2016. FINDINGS: Cardiovascular: The quality of this exam for evaluation of pulmonary embolism is good. No evidence of pulmonary embolism. Aortic atherosclerosis. Normal heart size, without pericardial effusion. Mediastinum/Nodes: No supraclavicular adenopathy. No mediastinal or definite hilar adenopathy, given limitations of unenhanced CT. No axillary or subpectoral adenopathy. Left axillary node dissection. Lungs/Pleura: No pleural fluid. 4 mm right lower lobe pulmonary nodule on image 85/ series 12, similar to on image 48/series 2 of the 04/19/2015 exam. This favors a benign etiology. Upper Abdomen: Normal imaged portions of the liver, spleen, stomach, pancreas, adrenal glands, kidneys. Prominence of the common duct in the porta hepatis, including 11 mm. Felt to be within normal variation after cholecystectomy. Musculoskeletal: Left breast implant with intracapsular rupture. heterogeneous marrow density throughout. Especially when compared back to 10/02/2012, this is new. Review of the MIP images confirms the above findings. IMPRESSION: 1.  No evidence of pulmonary embolism. 2. No evidence of soft tissue metastasis. 3. Heterogeneous marrow density throughout.  New since 2014. This is suspicious for osseous metastasis. Depending on clinical concern, consider bone scan. Electronically Signed   By: Abigail Miyamoto M.D.   On: 12/12/2016 13:46     Procedures Procedures (including critical care time)  Medications Ordered in ED Medications  predniSONE (DELTASONE) tablet 50 mg (not administered)  acetaminophen (TYLENOL) tablet 650 mg (650 mg Oral Given 12/12/16 1224)  iopamidol (ISOVUE-370) 76 % injection (  Contrast Given 12/12/16 1346)  iopamidol (ISOVUE-370) 76 % injection 100 mL (100 mLs Intravenous Contrast Given 12/12/16 1314)     Initial Impression / Assessment and Plan / ED Course  I have reviewed the triage vital signs and the nursing notes.  Pertinent labs & imaging results that were available during my care of the patient were reviewed by me and considered in my medical decision making (see chart for details).  Patient with PMH of L breast cancer in 2011 with bony met recurrence in 2017 who is presenting with acute worsening of chronic R sided flank pain. Patient was previously evaluated for this pain as an outpatient on 12/03/16 and had normal CMP, lipase, and urinalysis at that time. She denies dysuria, fevers, significant abdominal pain. She endorses some shortness of breath and lower extremity swelling (mainly in L thigh). Given patient's history of cancer and complaints of worsening shortness of breath, will obtain CT angio to rule out PE. Per chart review patient had palliative radiation to R 8th rib. Patient's SOB may be a result of decreased ability to perform deep inspirations 2/2 pain, as patient had trouble participating with exam. She had exquisite point tenderness in R mid thoracic region, without overlying rash. Concern for pathologic fracture right now, considering her history of R rip radiation and bony metastsis and physical exam findings. This will also be evaluated with CT angio to rule out PE.  Will also obtain CMP, lipase, CBC, and urinalysis to evaluate for infection, hepatic/gallbladder dysfunction, and possible UTI.   Final Clinical Impressions(s) / ED Diagnoses   Final diagnoses:  Right flank pain    Patient's blood work shows no signs of infection, gall bladder, liver, or urinary tract. Her CT angio of the chest was negative for PE, pleural effusion, and pneumonia. 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  We think the patient's pain is radicular, given its origination from the back and wrapping around the front in dermal distribution. Patient was given 50 mg prednisone x 5 days (1 dose in ED) and robaxin for muscle spasms.   Patient instructed to follow up with PCP and oncologist regarding her ED visit and CT scan. Patient was instructed that she should see them this week so that they can assess her improvement (or worsening of symptoms) with the above medications. Patient given return precautions and discharged home in stable condition.   New Prescriptions New Prescriptions   METHOCARBAMOL (ROBAXIN) 500 MG TABLET    Take 1 tablet (500 mg total) by mouth 4 (four) times daily.   PREDNISONE (DELTASONE) 50 MG TABLET    Take 1 tablet (50 mg total) by mouth daily.     Thomasene Ripple, MD 12/12/16 [phone]

## 2016-12-12 NOTE — ED Notes (Signed)
Pt reports shortness of breath when standing and walking. This RN took pt to restroom and pt reported "muscle spasm in stomach" while urinating. Pt states she has never had this type of pain before.

## 2016-12-12 NOTE — ED Provider Notes (Signed)
I saw and evaluated the patient, reviewed the resident's note and I agree with the findings and plan.   EKG Interpretation None      56 year old female with history of metastatic cancer presents with worsening chronic right-sided flank pain without fever or chills or rash. Has noted increasing dyspnea on exertion as well as chronic bilateral lower extremity pitting edema. No cough or congestion. Pain is worse with certain positions and she has no associated urinary symptoms. Concern for possible pathologic rib fracture versus pulmonary embolism. Will order chest CT for evaluation of PE.   Lacretia Leigh, MD 12/12/16 1149

## 2016-12-17 ENCOUNTER — Encounter (INDEPENDENT_AMBULATORY_CARE_PROVIDER_SITE_OTHER): Payer: Self-pay | Admitting: Physician Assistant

## 2016-12-17 ENCOUNTER — Ambulatory Visit (INDEPENDENT_AMBULATORY_CARE_PROVIDER_SITE_OTHER): Payer: Medicare Other | Admitting: Physician Assistant

## 2016-12-17 VITALS — BP 121/78 | HR 57 | Temp 98.0°F | Wt 277.6 lb

## 2016-12-17 DIAGNOSIS — I10 Essential (primary) hypertension: Secondary | ICD-10-CM | POA: Diagnosis not present

## 2016-12-17 DIAGNOSIS — L299 Pruritus, unspecified: Secondary | ICD-10-CM | POA: Diagnosis not present

## 2016-12-17 DIAGNOSIS — D72819 Decreased white blood cell count, unspecified: Secondary | ICD-10-CM | POA: Diagnosis not present

## 2016-12-17 MED ORDER — FUROSEMIDE 20 MG PO TABS: 20.0000 mg | ORAL_TABLET | Freq: Every day | ORAL | 3 refills | Status: DC

## 2016-12-17 MED ORDER — CARVEDILOL 6.25 MG PO TABS: 6.2500 mg | ORAL_TABLET | Freq: Two times a day (BID) | ORAL | 3 refills | Status: DC

## 2016-12-17 MED ORDER — AMLODIPINE BESYLATE 10 MG PO TABS: 10.0000 mg | ORAL_TABLET | Freq: Every day | ORAL | 3 refills | Status: DC

## 2016-12-17 MED ORDER — HYDROXYZINE HCL 25 MG PO TABS: 25.0000 mg | ORAL_TABLET | Freq: Two times a day (BID) | ORAL | 0 refills | Status: DC

## 2016-12-17 NOTE — Patient Instructions (Signed)
Pruritus  Pruritus is an itching feeling. There are many different conditions and factors that can make your skin itchy. Dry skin is one of the most common causes of itching. Most cases of itching do not require medical attention. Itchy skin can turn into a rash.  Follow these instructions at home:  Watch your pruritus for any changes. Take these steps to help with your condition:  Skin Care  · Moisturize your skin as needed. A moisturizer that contains petroleum jelly is best for keeping moisture in your skin.  · Take or apply medicines only as directed by your health care provider. This may include:  ? Corticosteroid cream.  ? Anti-itch lotions.  ? Oral anti-histamines.  · Apply cool compresses to the affected areas.  · Try taking a bath with:  ? Epsom salts. Follow the instructions on the packaging. You can get these at your local pharmacy or grocery store.  ? Baking soda. Pour a small amount into the bath as directed by your health care provider.  ? Colloidal oatmeal. Follow the instructions on the packaging. You can get this at your local pharmacy or grocery store.  · Try applying baking soda paste to your skin. Stir water into baking soda until it reaches a paste-like consistency.  · Do not scratch your skin.  · Avoid hot showers or baths, which can make itching worse. A cold shower may help with itching as long as you use a moisturizer after.  · Avoid scented soaps, detergents, and perfumes. Use gentle soaps, detergents, perfumes, and other cosmetic products.  General instructions  · Avoid wearing tight clothes.  · Keep a journal to help track what causes your itch. Write down:  ? What you eat.  ? What cosmetic products you use.  ? What you drink.  ? What you wear. This includes jewelry.  · Use a humidifier. This keeps the air moist, which helps to prevent dry skin.  Contact a health care provider if:  · The itching does not go away after several days.  · You sweat at night.  · You have weight loss.  · You  are unusually thirsty.  · You urinate more than normal.  · You are more tired than normal.  · You have abdominal pain.  · Your skin tingles.  · You feel weak.  · Your skin or the whites of your eyes look yellow (jaundice).  · Your skin feels numb.  This information is not intended to replace advice given to you by your health care provider. Make sure you discuss any questions you have with your health care provider.  Document Released: 11/22/2010 Document Revised: 08/18/2015 Document Reviewed: 03/08/2014  Elsevier Interactive Patient Education © 2018 [employer]

## 2016-12-17 NOTE — Progress Notes (Signed)
Subjective:  Patient ID: Michaela Little, female    DOB: [birth year]  Age: 56 y.o. MRN: [id number]  CC: HTN and itching  HPI  Michaela Little is a 56 y.o. female with a PMH of Anxiety, Breast CA, HTN, OSA, Asthma, and dyspnea presents for f/u of HTN. Her BP is now well controlled at 121/78 from 156/83 14 days ago. Currently taking Amlodipine 10 mg qday, Carvedilol 6.25 mg BID, and Lasix 20 mg qday. She is generally well except for difficulty sleeping and itching. Has recently started to use a CPAP and says she has to get used to it. Pruritis is generalized and sometimes appears as "hives". Although, she may have itching and there is no rash or erythema. Has not taken anything for relief. She was also noted to have persistent leukopenia. LFT normal and HCV Ab negative. Taking Ibrance for breast cancer. No other symptoms or complaints.     Outpatient Medications Prior to Visit  Medication Sig Dispense Refill  . albuterol (PROVENTIL HFA;VENTOLIN HFA) 108 (90 Base) MCG/ACT inhaler Inhale 2 puffs into the lungs every 6 (six) hours as needed for wheezing or shortness of breath.    Marland Kitchen amLODipine (NORVASC) 10 MG tablet Take 1 tablet (10 mg total) by mouth daily. 30 tablet 5  . BLACK CURRANT SEED OIL PO Take 15 mLs by mouth daily as needed (inflammation).    . Calcium Carbonate (CALCIUM 600 PO) Take 1 tablet by mouth daily.     . carvedilol (COREG) 6.25 MG tablet Take 1 tablet (6.25 mg total) by mouth 2 (two) times daily with a meal. 30 tablet 5  . Cholecalciferol (VITAMIN D3) 5000 units CAPS Take 1 capsule by mouth daily.     . famotidine (PEPCID) 20 MG tablet One at bedtime (Patient taking differently: Take 20 mg by mouth at bedtime. One at bedtime) 30 tablet 2  . fluticasone (FLOVENT HFA) 110 MCG/ACT inhaler Inhale 2 puffs into the lungs 2 (two) times daily. With spacer 1 Inhaler 5  . furosemide (LASIX) 20 MG tablet Take 1 tablet (20 mg total) by mouth daily. 30 tablet 5  . letrozole (FEMARA) 2.5 MG  tablet Take 1 tablet (2.5 mg total) by mouth daily. 90 tablet 4  . palbociclib (IBRANCE) 75 MG capsule Take 1 capsule (75 mg total) by mouth daily. Take whole with food. 21 capsule 0  . pantoprazole (PROTONIX) 40 MG tablet Take 1 tablet (40 mg total) by mouth daily. Take 30-60 min before first meal of the day 30 tablet 2  . promethazine (PHENERGAN) 25 MG tablet Take 1 tablet (25 mg total) by mouth every 6 (six) hours as needed for nausea or vomiting. 120 tablet 0  . ranitidine (ZANTAC) 300 MG tablet Take 1 tablet (300 mg total) by mouth at bedtime. 30 tablet 5  . albuterol (PROVENTIL HFA;VENTOLIN HFA) 108 (90 Base) MCG/ACT inhaler Inhale 2 puffs into the lungs every 4 (four) hours as needed for wheezing or shortness of breath. 1 Inhaler 5  . diphenhydrAMINE (BENADRYL) 25 mg capsule Take 25 mg by mouth every 6 (six) hours as needed for allergies.    . methocarbamol (ROBAXIN) 500 MG tablet Take 1 tablet (500 mg total) by mouth 4 (four) times daily. (Patient not taking: Reported on 12/17/2016) 30 tablet 0  . montelukast (SINGULAIR) 10 MG tablet Take 1 tablet (10 mg total) by mouth at bedtime. (Patient not taking: Reported on 12/17/2016) 30 tablet 5   No facility-administered medications prior to visit.  ROS Review of Systems  Constitutional: Negative for chills, fever and malaise/fatigue.  Eyes: Negative for blurred vision.  Respiratory: Negative for shortness of breath.   Cardiovascular: Negative for chest pain and palpitations.  Gastrointestinal: Negative for abdominal pain and nausea.  Genitourinary: Negative for dysuria and hematuria.  Musculoskeletal: Negative for joint pain and myalgias.  Skin: Positive for itching. Negative for rash.  Neurological: Negative for tingling and headaches.  Psychiatric/Behavioral: Negative for depression. The patient is not nervous/anxious.        Difficulty sleeping    Objective:  BP 121/78 (BP Location: Right Arm, Patient Position: Sitting, Cuff  Size: Large)   Pulse (!) 57   Temp 98 F (36.7 C) (Oral)   Wt 277 lb 9.6 oz (125.9 kg)   LMP 02/25/2009   SpO2 99%   BMI 43.48 kg/m   BP/Weight 12/17/2016 12/12/2016 6/64/4034  Systolic BP 742 595 638  Diastolic BP 78 79 83  Wt. (Lbs) 277.6 277 277  BMI 43.48 43.38 43.38      Physical Exam  Constitutional: She is oriented to person, place, and time.  Well developed, obese, NAD, polite  HENT:  Head: Normocephalic and atraumatic.  Eyes: No scleral icterus.  Neck: Normal range of motion. Neck supple. No thyromegaly present.  Cardiovascular: Normal rate, regular rhythm and normal heart sounds.   Pulmonary/Chest: Effort normal and breath sounds normal.  Musculoskeletal: She exhibits no edema.  Neurological: She is alert and oriented to person, place, and time. No cranial nerve deficit. Coordination normal.  Skin: Skin is warm and dry. No rash noted. No erythema. No pallor.  Psychiatric: She has a normal mood and affect. Her behavior is normal. Thought content normal.  Vitals reviewed.    Assessment & Plan:   1. Hypertension, unspecified type - Refill carvedilol (COREG) 6.25 MG tablet; Take 1 tablet (6.25 mg total) by mouth 2 (two) times daily with a meal.  Dispense: 180 tablet; Refill: 3 - Refill amLODipine (NORVASC) 10 MG tablet; Take 1 tablet (10 mg total) by mouth daily.  Dispense: 90 tablet; Refill: 3 - Refill furosemide (LASIX) 20 MG tablet; Take 1 tablet (20 mg total) by mouth daily.  Dispense: 90 tablet; Refill: 3  2. Pruritus - ANA w/Reflex - Begin hydrOXYzine (ATARAX/VISTARIL) 25 MG tablet; Take 1 tablet (25 mg total) by mouth 2 (two) times daily.  Dispense: 40 tablet; Refill: 0  3. Leukopenia, unspecified type - ANA w/Reflex   Meds ordered this encounter  Medications  . hydrOXYzine (ATARAX/VISTARIL) 25 MG tablet    Sig: Take 1 tablet (25 mg total) by mouth 2 (two) times daily.    Dispense:  40 tablet    Refill:  0    Order Specific Question:   Supervising  Provider    Answer:   Tresa Garter [id number]  . carvedilol (COREG) 6.25 MG tablet    Sig: Take 1 tablet (6.25 mg total) by mouth 2 (two) times daily with a meal.    Dispense:  180 tablet    Refill:  3    Order Specific Question:   Supervising Provider    Answer:   Tresa Garter [id number]  . amLODipine (NORVASC) 10 MG tablet    Sig: Take 1 tablet (10 mg total) by mouth daily.    Dispense:  90 tablet    Refill:  3    If not covered by insurance, please send request for alternative CCB that is covered. Thank you.    Order Specific Question:  Supervising Provider    Answer:   Tresa Garter [id number]  . furosemide (LASIX) 20 MG tablet    Sig: Take 1 tablet (20 mg total) by mouth daily.    Dispense:  90 tablet    Refill:  3    Order Specific Question:   Supervising Provider    Answer:   Tresa Garter [id number]    Follow-up: Return in about 4 months (around 04/18/2017) for HTN.   Clent Demark PA

## 2016-12-18 ENCOUNTER — Telehealth (INDEPENDENT_AMBULATORY_CARE_PROVIDER_SITE_OTHER): Payer: Self-pay

## 2016-12-18 DIAGNOSIS — Z1231 Encounter for screening mammogram for malignant neoplasm of breast: Secondary | ICD-10-CM | POA: Diagnosis not present

## 2016-12-18 DIAGNOSIS — Z853 Personal history of malignant neoplasm of breast: Secondary | ICD-10-CM | POA: Diagnosis not present

## 2016-12-18 LAB — ANA W/REFLEX: Anti Nuclear Antibody(ANA): NEGATIVE

## 2016-12-18 NOTE — Telephone Encounter (Signed)
-----   Message from Clent Demark, PA-C sent at 12/18/2016  1:41 PM EDT ----- Negative for autoimmune disease.

## 2016-12-18 NOTE — Telephone Encounter (Signed)
Patient aware that lab results are negative for autoimmune disease. Nat Christen, CMA

## 2016-12-19 ENCOUNTER — Other Ambulatory Visit: Payer: Self-pay

## 2016-12-19 DIAGNOSIS — Z170 Estrogen receptor positive status [ER+]: Principal | ICD-10-CM

## 2016-12-19 DIAGNOSIS — C50812 Malignant neoplasm of overlapping sites of left female breast: Secondary | ICD-10-CM

## 2016-12-20 ENCOUNTER — Telehealth (INDEPENDENT_AMBULATORY_CARE_PROVIDER_SITE_OTHER): Payer: Self-pay | Admitting: Physician Assistant

## 2016-12-20 ENCOUNTER — Ambulatory Visit (HOSPITAL_BASED_OUTPATIENT_CLINIC_OR_DEPARTMENT_OTHER): Payer: Medicare Other

## 2016-12-20 ENCOUNTER — Other Ambulatory Visit (INDEPENDENT_AMBULATORY_CARE_PROVIDER_SITE_OTHER): Payer: Self-pay | Admitting: Physician Assistant

## 2016-12-20 ENCOUNTER — Other Ambulatory Visit: Payer: Self-pay

## 2016-12-20 ENCOUNTER — Other Ambulatory Visit (HOSPITAL_BASED_OUTPATIENT_CLINIC_OR_DEPARTMENT_OTHER): Payer: Medicare Other

## 2016-12-20 DIAGNOSIS — C7951 Secondary malignant neoplasm of bone: Secondary | ICD-10-CM | POA: Diagnosis present

## 2016-12-20 DIAGNOSIS — Z760 Encounter for issue of repeat prescription: Secondary | ICD-10-CM

## 2016-12-20 DIAGNOSIS — C50812 Malignant neoplasm of overlapping sites of left female breast: Secondary | ICD-10-CM | POA: Diagnosis not present

## 2016-12-20 DIAGNOSIS — C50219 Malignant neoplasm of upper-inner quadrant of unspecified female breast: Secondary | ICD-10-CM

## 2016-12-20 DIAGNOSIS — Z170 Estrogen receptor positive status [ER+]: Secondary | ICD-10-CM

## 2016-12-20 LAB — CBC WITH DIFFERENTIAL/PLATELET
BASO%: 0.6 % (ref 0.0–2.0)
BASOS ABS: 0 10*3/uL (ref 0.0–0.1)
EOS ABS: 0.1 10*3/uL (ref 0.0–0.5)
EOS%: 1.4 % (ref 0.0–7.0)
HEMATOCRIT: 35 % (ref 34.8–46.6)
HEMOGLOBIN: 11.8 g/dL (ref 11.6–15.9)
LYMPH#: 1 10*3/uL (ref 0.9–3.3)
LYMPH%: 18.7 % (ref 14.0–49.7)
MCH: 31.1 pg (ref 25.1–34.0)
MCHC: 33.8 g/dL (ref 31.5–36.0)
MCV: 92.2 fL (ref 79.5–101.0)
MONO#: 0.4 10*3/uL (ref 0.1–0.9)
MONO%: 7.2 % (ref 0.0–14.0)
NEUT%: 72.1 % (ref 38.4–76.8)
NEUTROS ABS: 3.8 10*3/uL (ref 1.5–6.5)
Platelets: 252 10*3/uL (ref 145–400)
RBC: 3.8 10*6/uL (ref 3.70–5.45)
RDW: 15.3 % — AB (ref 11.2–14.5)
WBC: 5.3 10*3/uL (ref 3.9–10.3)

## 2016-12-20 LAB — COMPREHENSIVE METABOLIC PANEL
ALT: 6 U/L (ref 0–55)
ANION GAP: 7 meq/L (ref 3–11)
AST: 13 U/L (ref 5–34)
Albumin: 3.6 g/dL (ref 3.5–5.0)
Alkaline Phosphatase: 82 U/L (ref 40–150)
BUN: 15.3 mg/dL (ref 7.0–26.0)
CALCIUM: 9 mg/dL (ref 8.4–10.4)
CHLORIDE: 109 meq/L (ref 98–109)
CO2: 25 meq/L (ref 22–29)
Creatinine: 0.9 mg/dL (ref 0.6–1.1)
EGFR: 87 mL/min/{1.73_m2} — AB (ref >90)
Glucose: 130 mg/dl (ref 70–140)
POTASSIUM: 3.8 meq/L (ref 3.5–5.1)
Sodium: 141 mEq/L (ref 136–145)
Total Bilirubin: 0.79 mg/dL (ref 0.20–1.20)
Total Protein: 7.2 g/dL (ref 6.4–8.3)

## 2016-12-20 MED ORDER — METHOCARBAMOL 500 MG PO TABS: 500.0000 mg | ORAL_TABLET | Freq: Four times a day (QID) | ORAL | 0 refills | Status: DC

## 2016-12-20 MED ORDER — DENOSUMAB 120 MG/1.7ML ~~LOC~~ SOLN
120.0000 mg | Freq: Once | SUBCUTANEOUS | Status: AC
  Administered 2016-12-20: 120 mg via SUBCUTANEOUS
  Filled 2016-12-20: qty 1.7

## 2016-12-20 NOTE — Telephone Encounter (Signed)
Pt called since she was here on 12/17/16 and only 2 of the 4 prescription according to the pharmacy got there She is missing the carvedilol (COREG) 6.25 MG tablet  And the mussel relaxing (she doesn't;t know the name) Please follow up

## 2016-12-20 NOTE — Telephone Encounter (Signed)
Please let patient know that I will send this time but we did not discuss muscular pains last visit. Furthermore, I went over all the medications prescribed at last visit with her. I will no longer send out refills for things that are not discussed at her last visit.

## 2016-12-20 NOTE — Telephone Encounter (Signed)
Patient does have her carvedilol, she needs muscle relaxer.

## 2016-12-21 NOTE — Telephone Encounter (Signed)
Patient is aware. Bradon Fester S Loredana Medellin, CMA  

## 2016-12-25 ENCOUNTER — Ambulatory Visit: Payer: Medicare Other

## 2016-12-26 ENCOUNTER — Ambulatory Visit: Payer: Medicare Other | Admitting: Allergy and Immunology

## 2016-12-27 ENCOUNTER — Telehealth: Payer: Self-pay

## 2016-12-27 DIAGNOSIS — C50812 Malignant neoplasm of overlapping sites of left female breast: Secondary | ICD-10-CM

## 2016-12-27 DIAGNOSIS — C7951 Secondary malignant neoplasm of bone: Secondary | ICD-10-CM

## 2016-12-27 DIAGNOSIS — Z170 Estrogen receptor positive status [ER+]: Principal | ICD-10-CM

## 2016-12-27 NOTE — Telephone Encounter (Signed)
Pt called with pain in right side, SOB, and pain with movement. She went to ER on 9/19 with no acute findings. CT was neg for PE, pleural effusion and pneumonia. She was ddx radicular pain and given prednisone for 5 days.  Pt saw her PCP on 9/24.  Pt is saying she can barely move her right arm, she is seeing some swelling on the right rib cage that showed up since her ED visit. The pain worsens as she tries to stand, and as she walks. It radiates to back and up to her shoulder. Pain is primarily in side and front of rib cage. When least pain is 7/10, pain is 10/10 when moving. Pressure, and sometimes feels like something is sticking her. No fever. Pain with deep breath.   The prednisone decreased pain some. she did not fill robaxin rx. Aleve helps some, she does not use narcotics. Not tried heat or ice.   She is taking ibrance and letrozole.  Next ov is 10/25   She requested change in MD from Dr Jana Hakim while at infusion visit on 12/20/16.  Call back [phone]

## 2016-12-27 NOTE — Telephone Encounter (Signed)
S/w Mendel Ryder NP and Lucianne Lei PA. Pt to come to [facility] tomorrow.   Pt s/w daughter and says she can come to [facility] about 10 am for CXR then [facility] after.   Cbc cmet, cxr and unilat ribs xray ordered.  inbasket to scheduler

## 2016-12-28 ENCOUNTER — Other Ambulatory Visit: Payer: Self-pay | Admitting: Medical

## 2016-12-28 ENCOUNTER — Other Ambulatory Visit (HOSPITAL_BASED_OUTPATIENT_CLINIC_OR_DEPARTMENT_OTHER): Payer: Medicare Other

## 2016-12-28 ENCOUNTER — Ambulatory Visit (HOSPITAL_COMMUNITY)
Admission: RE | Admit: 2016-12-28 | Discharge: 2016-12-28 | Disposition: A | Discharge status: Home / Self Care | Payer: Medicare Other | Source: Ambulatory Visit | Attending: Medical | Admitting: Medical

## 2016-12-28 ENCOUNTER — Ambulatory Visit (HOSPITAL_BASED_OUTPATIENT_CLINIC_OR_DEPARTMENT_OTHER): Payer: Medicare Other | Admitting: Medical

## 2016-12-28 VITALS — BP 127/59 | HR 74 | Temp 98.8°F | Resp 20 | Ht 67.0 in | Wt 282.8 lb

## 2016-12-28 DIAGNOSIS — M5414 Radiculopathy, thoracic region: Secondary | ICD-10-CM

## 2016-12-28 DIAGNOSIS — C50812 Malignant neoplasm of overlapping sites of left female breast: Secondary | ICD-10-CM

## 2016-12-28 DIAGNOSIS — C7951 Secondary malignant neoplasm of bone: Secondary | ICD-10-CM | POA: Insufficient documentation

## 2016-12-28 DIAGNOSIS — R079 Chest pain, unspecified: Secondary | ICD-10-CM | POA: Diagnosis not present

## 2016-12-28 DIAGNOSIS — M898X9 Other specified disorders of bone, unspecified site: Secondary | ICD-10-CM

## 2016-12-28 DIAGNOSIS — Z170 Estrogen receptor positive status [ER+]: Secondary | ICD-10-CM | POA: Insufficient documentation

## 2016-12-28 DIAGNOSIS — C50912 Malignant neoplasm of unspecified site of left female breast: Secondary | ICD-10-CM | POA: Diagnosis not present

## 2016-12-28 DIAGNOSIS — R222 Localized swelling, mass and lump, trunk: Secondary | ICD-10-CM | POA: Diagnosis not present

## 2016-12-28 LAB — CBC WITH DIFFERENTIAL/PLATELET
BASO%: 0.5 % (ref 0.0–2.0)
Basophils Absolute: 0 10*3/uL (ref 0.0–0.1)
EOS%: 4.8 % (ref 0.0–7.0)
Eosinophils Absolute: 0.2 10*3/uL (ref 0.0–0.5)
HEMATOCRIT: 35 % (ref 34.8–46.6)
HGB: 11.7 g/dL (ref 11.6–15.9)
LYMPH#: 1.1 10*3/uL (ref 0.9–3.3)
LYMPH%: 28.4 % (ref 14.0–49.7)
MCH: 30.7 pg (ref 25.1–34.0)
MCHC: 33.4 g/dL (ref 31.5–36.0)
MCV: 91.9 fL (ref 79.5–101.0)
MONO#: 0.2 10*3/uL (ref 0.1–0.9)
MONO%: 4.8 % (ref 0.0–14.0)
NEUT%: 61.5 % (ref 38.4–76.8)
NEUTROS ABS: 2.3 10*3/uL (ref 1.5–6.5)
PLATELETS: 253 10*3/uL (ref 145–400)
RBC: 3.81 10*6/uL (ref 3.70–5.45)
RDW: 14.5 % (ref 11.2–14.5)
WBC: 3.8 10*3/uL — AB (ref 3.9–10.3)

## 2016-12-28 LAB — COMPREHENSIVE METABOLIC PANEL
ALT: 23 U/L (ref 0–55)
ANION GAP: 9 meq/L (ref 3–11)
AST: 27 U/L (ref 5–34)
Albumin: 3.7 g/dL (ref 3.5–5.0)
Alkaline Phosphatase: 88 U/L (ref 40–150)
BILIRUBIN TOTAL: 1.43 mg/dL — AB (ref 0.20–1.20)
BUN: 7.9 mg/dL (ref 7.0–26.0)
CHLORIDE: 109 meq/L (ref 98–109)
CO2: 24 meq/L (ref 22–29)
CREATININE: 0.8 mg/dL (ref 0.6–1.1)
Calcium: 8.7 mg/dL (ref 8.4–10.4)
EGFR: 90 mL/min/{1.73_m2} — ABNORMAL LOW (ref >90)
Glucose: 138 mg/dl (ref 70–140)
Potassium: 3.6 mEq/L (ref 3.5–5.1)
Sodium: 141 mEq/L (ref 136–145)
TOTAL PROTEIN: 7.2 g/dL (ref 6.4–8.3)

## 2016-12-28 MED ORDER — TRAMADOL HCL 50 MG PO TABS: 50.0000 mg | ORAL_TABLET | Freq: Three times a day (TID) | ORAL | 0 refills | Status: DC | PRN

## 2016-12-28 MED ORDER — GABAPENTIN 100 MG PO CAPS: 200.0000 mg | ORAL_CAPSULE | Freq: Every day | ORAL | 2 refills | Status: DC

## 2016-12-28 NOTE — Progress Notes (Signed)
Contacted central scheduling for pt's RUQ ultrasound. To be done 01/01/17 @ 11:30 am. NPO after MN and pt to arrive 15 minutes prior to exam. Pt made aware and voiced understanding.

## 2016-12-28 NOTE — Progress Notes (Signed)
[facility] Progress Note   Michaela Little [id number] 05/28/1960 56 y.o.  Michaela Little is managed by Dr. Gunnar Bulla Magrinat  Actively treated with chemotherapy: yes  Current Therapy: Letrozole and palbociclib  Last Treated: 10 / 05 / 2018  Assessment: Plan:    Radiculopathy of thoracic region - Plan: gabapentin (NEURONTIN) 100 MG capsule, traMADol (ULTRAM) 50 MG tablet, DISCONTINUED: traMADol (ULTRAM) 50 MG tablet  Hyperbilirubinemia - Plan: US Abdomen Limited   Suspected radiculopathy and metastatic bone pain the patient was given a prescription for gabapentin 200 mg by mouth daily at bedtime and tramadol 50 mg by mouth 3 times a day for suspected radiculopathy.  Hyperbilirubinemia. The patient's bilirubin returned mildly elevated at 1.43 today. Based on the fact that she reports having recent light-colored stools and dark or yellow urine she has been referred for a right upper quadrant ultrasound. We will await these results.  Please see After Visit Summary for patient specific instructions.  Future Appointments Date Time Provider [place]  01/01/2017 11:15 AM Neldon Mc, Donnamarie Poag, MD [facility] None  01/01/2017 11:30 AM [facility] 2 [facility] [place]  01/17/2017 11:45 AM [facility] LAB 6 [facility] None  01/17/2017 12:15 PM Magrinat, Virgie Dad, MD [facility] None  01/17/2017 12:45 PM [facility] INJ NURSE [facility] None    Orders Placed This Encounter  Procedures  . US Abdomen Limited       Subjective:   Patient ID:  Michaela Little is a 56 y.o. (DOB 05/17/1960) female.  Chief Complaint:  Chief Complaint  Patient presents with  . Pain    right rib cage to right back    HPI Michaela Little is a 56 year old female with a history of a left breast cancer dating to March 2010.  She was treated neo-adjuvantly in [place] with paclitaxel followed by doxorubicin/cyclophosphamide given in a dose dense fashion. Both were given with tifipamib. She proceeded  with a left modified radical mastectomy on 12/23/2008. Her final pathology showed a residual invasive lobular carcinoma with involvement of 5 of 22 lymph nodes sampled. The tumor was ER and PR positive and HER-2/neu negative. Postoperatively she received radiation which was completed in January 2011. She was initiated on tamoxifen but developed uterine lining thickening and was switched to anastrozole in May 2012. She had an iliac bone biopsy on 05/26/2015 which showed metastatic carcinoma consistent with a breast primary, ER positive, PR and HER-2/neu negative. Staging CT scans of the chest abdomen and pelvis along with an MRI of the brain and PET scan completed between February and March 2017 in [place] showed no evidence of visceral disease. She was treated with palliative radiation also lead to a lesion in the right eighth rib and right up from 06/24/2015 to 06/27/2015. Letrozole and palbociclib were begun in March 2017. The patient developed cytopenias with palbociclib and required a dose reduction. Repeat chest CT and bone scan on 11/07/2015 showed stable bone only metastatic disease. Scans were completed on 03/01/2016 which showed no visceral disease. A CT scan of the chest completed on 04/19/2015 in [place] returned showing a central pulmonary embolus. She was initiated on enoxaparin. A repeat CT scan completed on 10/18/2015 showed no pulmonary embolus. Enoxaparin was discontinued at that time. She was initiated on Xgeva on 11/16/2015 which has been given monthly.  Most recently the patient was seen in the emergency room on 12/12/2016 for worsening chronic right sided flank pain. A CT angiogram the chest was completed  and returned negative for pulmonary emboli, pleural effusion, and pneumonia. The scan did show heterogeneous marrow density throughout all ribs which is new since 2014 and was felt to be consistent with the patient's history of osseous metastatic disease. The ER felt that her pain was  radicular given the fact that it originated in her back and wraps around the front in a dermal distribution. She was given 50 mg of prednisone for 5 days with one dose given in the emergency room and was given Robaxin for muscle spasms. Michaela Little contact our office on 12/27/2016 stating that she was continuing to have right-sided pain, shortness of breath, and pain with movement. She stated that she can barely move her right arm and had noted swelling on the right rib cage which was new since her emergency room visit. Her pain is worsened when she tries to stand or walk. He continues to radiate to her back and up to her shoulder. At the least, her pain is rated at 7/10 with it increasing to 10/10 with movement. She reports that it feels like something is sticking her at times. She is having no fever but has pain with deep breathing. She reports that the prednisone which is given to her helped decrease her pain somewhat. She did not fill her prescription for Robaxin. She stated that Aleve helps some. She does not take any narcotics. She had not tried heat or ice. She is less treated with Xgeva on 12/20/2016 and continues on ibrance and letrozole. Her CT scan of the chest with cuts through the upper abdomen from 12/12/2016 showed no evidence of pulmonary embolisms or soft tissue metastasis. As noted above osseous metastasis were noted.   Medications: I have reviewed the patient's current medications.  Allergies:  Allergies  Allergen Reactions  . Lisinopril-Hydrochlorothiazide Itching  . Adhesive [Tape] Itching and Other (See Comments)    Redness  . Fentanyl     GI upset and drowsiness  . Hctz [Hydrochlorothiazide] Itching  . Latex Itching  . Lisinopril Itching  . Losartan Potassium Other (See Comments)    Makes her feel "bad"   . Oxycodone     GI upset, drowsy    Past Medical History:  Diagnosis Date  . Anxiety   . Arthritis    hands, knees, hips  . Asthma   . Cancer ([place])   . Cervical  stenosis (uterine cervix)   . Dyspnea on exertion   . GERD (gastroesophageal reflux disease)   . Headache(784.0)   . History of breast cancer    2010--  LEFT  s/p  mastectomy (in [place]) AND CHEMORADIATION--  NO RECURRENCE  . History of cervical dysplasia   . History of TB skin testing    AS TEEN--  [place]/ MEDS  . Hyperlipidemia   . Hypertension   . Malignant neoplasm of overlapping sites of left breast in female, estrogen receptor positive ([place]) 03/05/2013  . OSA (obstructive sleep apnea) moderate osa per study  09/2010   CPAP  , NOT USING ON REGULAR BASIS  . Pelvic pain in female   . Positive H. pylori test    08-05-2013  . Refusal of blood transfusions as patient is Jehovah's Witness   . Seasonal allergies   . Uterine fibroid   . Wears glasses     Past Surgical History:  Procedure Laterality Date  . CARDIOVASCULAR STRESS TEST  10-29-2012   low risk perfusion study/  no significant reversibity/ ef 66%/  normal wall motion  .  CERVICAL CONIZATION W/BX  2012   in  [place] N/A 05/14/2012   Procedure: LAPAROSCOPIC CHOLECYSTECTOMY;  Surgeon: Ralene Ok, MD;  Location: [facility];  Service: General;  Laterality: N/A;  . COLONOSCOPY  2011   normal per patient - [place]  . DILATION AND CURETTAGE OF UTERUS  10/15/2011   Procedure: DILATATION AND CURETTAGE;  Surgeon: Melina Schools, MD;  Location: [facility];  Service: Gynecology;  Laterality: N/A;  Conization &  endocervical curettings  . EXAMINATION UNDER ANESTHESIA N/A 08/20/2013   Procedure: EXAM UNDER ANESTHESIA;  Surgeon: Margarette Asal, MD;  Location[facility];  Service: Gynecology;  Laterality: N/A;  . [place]   right  . LAPAROSCOPIC ASSISTED VAGINAL HYSTERECTOMY N/A 10/26/2014   Procedure: HYSTERECTOMY ABDOMINAL ;  Surgeon: Molli Posey, MD;  Location: [facility];  Service: Gynecology;  Laterality: N/A;  . MASTECTOMY Left 11/2008  in [place]   . SALPINGOOPHORECTOMY Bilateral 10/26/2014    Procedure: BILATERAL SALPINGO OOPHORECTOMY;  Surgeon: Molli Posey, MD;  Location: [facility];  Service: Gynecology;  Laterality: Bilateral;  . TRANSTHORACIC ECHOCARDIOGRAM  10-29-2012   mild lvh/  ef 60-65%/  grade II diastolic dysfunction/  trivial mr  &  tr    Family History  Problem Relation Age of Onset  . Breast cancer Mother   . Colon cancer Mother   . Hypotension Mother   . Asthma Mother   . Diabetes type II Mother   . Arthritis Mother   . Clotting disorder Mother   . Cancer Mother        breast/colon  . Mental illness Brother   . Heart disease Brother   . Cerebral palsy Daughter   . Emphysema Brother        never smoker  . Colon cancer Maternal Aunt   . Cancer Maternal Aunt        colon  . Colon cancer Maternal Uncle   . Cancer Maternal Uncle        colon  . Esophageal cancer Neg Hx   . Rectal cancer Neg Hx   . Stomach cancer Neg Hx   . Thyroid disease Neg Hx     Social History   Social History  . Marital status: Single    Spouse name: N/A  . Number of children: 5  . Years of education: N/A   Occupational History  . Disabled    Social History Main Topics  . Smoking status: Former Smoker    Packs/day: 0.30    Years: 10.00    Types: Cigarettes    Quit date: 03/26/1988  . Smokeless tobacco: Never Used  . Alcohol use No  . Drug use: No  . Sexual activity: Not Currently   Other Topics Concern  . Not on file   Social History Narrative  . No narrative on file    Past Medical History, Surgical history, Social history, and Family history were reviewed and updated as appropriate.   Please see review of systems for further details on the patient's review from today.   Review of Systems:  Review of Systems  Constitutional: Negative for appetite change, chills, diaphoresis and fever.  Respiratory: Negative for cough, chest tightness and shortness of breath.   Cardiovascular: Positive for chest pain (Right lateral inferior chest wall pain). Negative  for palpitations and leg swelling.  Gastrointestinal: Positive for constipation. Negative for abdominal distention, abdominal pain, diarrhea, nausea and vomiting.  Recent light-colored stools  Genitourinary:       Recent dark yellow urine  Musculoskeletal: Positive for back pain.    Objective:   Physical Exam:  BP (!) 127/59 (BP Location: Left Arm, Patient Position: Sitting)   Pulse 74   Temp 98.8 F (37.1 C) (Oral)   Resp 20   Ht 5\' 7"  (1.702 m)   Wt 282 lb 12.8 oz (128.3 kg)   LMP 02/25/2009   SpO2 100%   BMI 44.29 kg/m  ECOG: 2   Physical Exam  Constitutional: No distress.  HENT:  Head: Normocephalic and atraumatic.  Cardiovascular: Normal rate, regular rhythm and normal heart sounds.  Exam reveals no gallop and no friction rub.   No murmur heard. Pulmonary/Chest: Effort normal and breath sounds normal. No respiratory distress. She has no wheezes. She has no rales.  Abdominal: Soft. Bowel sounds are normal. She exhibits no distension. There is no tenderness. There is no rebound and no guarding.  Musculoskeletal: She exhibits tenderness (Tenderness along right lateral inferior ribs.). She exhibits no edema.  Neurological: She is alert.  Skin: Skin is warm and dry. She is not diaphoretic.    Lab Review:     Component Value Date/Time   NA 141 12/28/2016 1108   K 3.6 12/28/2016 1108   CL 103 12/12/2016 1220   CL 105 07/07/2012 1327   CO2 24 12/28/2016 1108   GLUCOSE 138 12/28/2016 1108   GLUCOSE 104 (H) 07/07/2012 1327   BUN 7.9 12/28/2016 1108   CREATININE 0.8 12/28/2016 1108   CALCIUM 8.7 12/28/2016 1108   PROT 7.2 12/28/2016 1108   ALBUMIN 3.7 12/28/2016 1108   AST 27 12/28/2016 1108   ALT 23 12/28/2016 1108   ALKPHOS 88 12/28/2016 1108   BILITOT 1.43 (H) 12/28/2016 1108   GFRNONAA >60 12/12/2016 1220   GFRAA >60 12/12/2016 1220       Component Value Date/Time   WBC 3.8 (L) 12/28/2016 1108   WBC 2.9 (L) 12/12/2016 1220   RBC 3.81 12/28/2016 1108     RBC 4.07 12/12/2016 1220   HGB 11.7 12/28/2016 1108   HCT 35.0 12/28/2016 1108   PLT 253 12/28/2016 1108   PLT 231 12/03/2016 1127   MCV 91.9 12/28/2016 1108   MCH 30.7 12/28/2016 1108   MCH 30.2 12/12/2016 1220   MCHC 33.4 12/28/2016 1108   MCHC 34.1 12/12/2016 1220   RDW 14.5 12/28/2016 1108   LYMPHSABS 1.1 12/28/2016 1108   MONOABS 0.2 12/28/2016 1108   EOSABS 0.2 12/28/2016 1108   EOSABS 0.2 12/03/2016 1127   BASOSABS 0.0 12/28/2016 1108   -------------------------------  Imaging from last 24 hours (if applicable):  Radiology interpretation: Dg Chest 2 View  Result Date: 12/28/2016 CLINICAL DATA:  Left breast cancer with metastasis to the bones. EXAM: CHEST  2 VIEW COMPARISON:  12/28/2016 FINDINGS: The heart size and mediastinal contours are within normal limits. Both lungs are clear. Diffuse sclerotic bone metastases are again noted. Tissue expander overlies the left chest wall. IMPRESSION: 1. No acute cardiopulmonary abnormality Electronically Signed   By: 02/27/2017 M.D.   On: 12/28/2016 10:38   Dg Ribs Unilateral Right  Result Date: 12/28/2016 CLINICAL DATA:  Right rib pain and swelling. History of metastatic breast cancer with osseous metastatic disease. EXAM: RIGHT RIBS - 2 VIEW COMPARISON:  CT 12/12/2016.  Chest radiographs 09/12/2016. FINDINGS: There is no dedicated chest radiograph. There is patchy opacity at the right lung base, probably atelectasis. No evidence of pleural  effusion or pneumothorax. No evidence of acute rib fracture or focal lytic or blastic lesion. Degenerative changes throughout the spine. IMPRESSION: No acute or focal rib abnormalities identified. Probable right basilar pulmonary atelectasis. Electronically Signed   By: Richardean Sale M.D.   On: 12/28/2016 10:40   Ct Angio Chest Pe W/cm &/or Wo Cm  Result Date: 12/12/2016 CLINICAL DATA:  Shortness of breath. History of left breast cancer with mastectomy. Hypertension. Gastroesophageal reflux  disease. EXAM: CT ANGIOGRAPHY CHEST WITH CONTRAST TECHNIQUE: Multidetector CT imaging of the chest was performed using the standard protocol during bolus administration of intravenous contrast. Multiplanar CT image reconstructions and MIPs were obtained to evaluate the vascular anatomy. CONTRAST:  100 cc of Isovue 370 COMPARISON:  07/03/2016 CT.  Plain films 09/12/2016. FINDINGS: Cardiovascular: The quality of this exam for evaluation of pulmonary embolism is good. No evidence of pulmonary embolism. Aortic atherosclerosis. Normal heart size, without pericardial effusion. Mediastinum/Nodes: No supraclavicular adenopathy. No mediastinal or definite hilar adenopathy, given limitations of unenhanced CT. No axillary or subpectoral adenopathy. Left axillary node dissection. Lungs/Pleura: No pleural fluid. 4 mm right lower lobe pulmonary nodule on image 85/ series 12, similar to on image 48/series 2 of the 04/19/2015 exam. This favors a benign etiology. Upper Abdomen: Normal imaged portions of the liver, spleen, stomach, pancreas, adrenal glands, kidneys. Prominence of the common duct in the porta hepatis, including 11 mm. Felt to be within normal variation after cholecystectomy. Musculoskeletal: Left breast implant with intracapsular rupture. heterogeneous marrow density throughout. Especially when compared back to 10/02/2012, this is new. Review of the MIP images confirms the above findings. IMPRESSION: 1.  No evidence of pulmonary embolism. 2. No evidence of soft tissue metastasis. 3. Heterogeneous marrow density throughout. New since 2014. This is suspicious for osseous metastasis. Depending on clinical concern, consider bone scan. Electronically Signed   By: Abigail Miyamoto M.D.   On: 12/12/2016 13:46

## 2017-01-01 ENCOUNTER — Ambulatory Visit (HOSPITAL_COMMUNITY)
Admission: RE | Admit: 2017-01-01 | Discharge: 2017-01-01 | Disposition: A | Discharge status: Home / Self Care | Payer: Medicare Other | Source: Ambulatory Visit | Attending: Medical | Admitting: Medical

## 2017-01-01 ENCOUNTER — Other Ambulatory Visit: Payer: Self-pay | Admitting: Internal Medicine

## 2017-01-01 ENCOUNTER — Ambulatory Visit: Payer: Medicare Other | Admitting: Allergy and Immunology

## 2017-01-01 DIAGNOSIS — Z9049 Acquired absence of other specified parts of digestive tract: Secondary | ICD-10-CM | POA: Diagnosis not present

## 2017-01-01 DIAGNOSIS — R1011 Right upper quadrant pain: Secondary | ICD-10-CM | POA: Insufficient documentation

## 2017-01-01 DIAGNOSIS — R0609 Other forms of dyspnea: Principal | ICD-10-CM

## 2017-01-02 ENCOUNTER — Telehealth: Payer: Self-pay | Admitting: Allergy and Immunology

## 2017-01-02 ENCOUNTER — Ambulatory Visit (INDEPENDENT_AMBULATORY_CARE_PROVIDER_SITE_OTHER): Payer: Medicare Other | Admitting: Allergy and Immunology

## 2017-01-02 ENCOUNTER — Telehealth: Payer: Self-pay | Admitting: Oncology

## 2017-01-02 VITALS — BP 110/78 | HR 68 | Resp 18

## 2017-01-02 DIAGNOSIS — J3089 Other allergic rhinitis: Secondary | ICD-10-CM | POA: Diagnosis not present

## 2017-01-02 DIAGNOSIS — L500 Allergic urticaria: Secondary | ICD-10-CM

## 2017-01-02 DIAGNOSIS — K219 Gastro-esophageal reflux disease without esophagitis: Secondary | ICD-10-CM | POA: Diagnosis not present

## 2017-01-02 DIAGNOSIS — J4530 Mild persistent asthma, uncomplicated: Secondary | ICD-10-CM

## 2017-01-02 NOTE — Telephone Encounter (Signed)
Pt came in to be seen and she said that she got a bill and dont know why because she has medicare and medicad. #[id number]. [birth year].

## 2017-01-02 NOTE — Patient Instructions (Addendum)
  1. Continue to perform Allergen avoidance measures  2. Continue to Treat and prevent inflammation:   A. OTC Nasacort - 1 spray each nostril one time per day  B. Flovent 110 - 2 inhalations one time per day with spacer  C. montelukast 10 mg - one tablet one time per day  3. Continue to Treat and prevent reflux:   A. consolidate all chocolate and caffeine consumption  B. pantoprazole 40 mg tablet in a.m.  C. ranitidine 300 mg tablet in PM  4. If needed:   A. Hydroxyzine 25mg  tablet 1-2 times per day  B. OTC Benadryl  C. Proventil HFA or similar 2 inhalations every 4-6 hours  5. Return to clinic in 12 weeks or earlier if problem

## 2017-01-02 NOTE — Telephone Encounter (Signed)
Scheduled appt per 10/7 sch message - patient is aware of appt date and time.

## 2017-01-02 NOTE — Progress Notes (Signed)
Follow-up Note  Referring Provider: Clent Demark, PA-C Primary Provider: [name] Date of Office Visit: 01/02/2017  Subjective:   Michaela Little (DOB: 07-23-1960) is a 56 y.o. female who returns to the [facility] [place] on 01/02/2017 in re-evaluation of the following:  HPI: Michaela Little returns to this clinic in reevaluation of her mild asthma, allergic rhinitis, LPR, and pruritus with urticaria. I last saw her in this clinic during her initial evaluation of 11/27/2016.  She is much better. She has very little issue with her respiratory tract. She has resolved her throat clearing and the drainage in her throat but she still has an intermittent cough although of lower intensity and frequency since starting therapy. She has no congestion and no sneezing. She still continues to have headaches about once every other week or so. She has no itchiness and has not noted any urticaria.  She has been consistently using hydroxyzine twice a day and continues to utilize anti-inflammatory medications for her respiratory tract and therapy directed against reflux as prescribed during her last visit.  She is having some increased abdominal discomfort and pain. This does not appear to be correlated with the use of her medications prescribed during her last visit as this issue was definitely present and escalating prior to starting these medications. She did have an ultrasound of her abdomen in investigation of this issue and she has follow-up this week.  Allergies as of 01/02/2017      Reactions   Lisinopril-hydrochlorothiazide Itching   Adhesive [tape] Itching, Other (See Comments)   Redness   Fentanyl    GI upset and drowsiness   Hctz [hydrochlorothiazide] Itching   Latex Itching   Lisinopril Itching   Losartan Potassium Other (See Comments)   Makes her feel "bad"   Oxycodone    GI upset, drowsy      Medication List      albuterol 108 (90 Base) MCG/ACT  inhaler Commonly known as:  PROVENTIL HFA;VENTOLIN HFA Inhale 2 puffs into the lungs every 6 (six) hours as needed for wheezing or shortness of breath.   amLODipine 10 MG tablet Commonly known as:  NORVASC Take 1 tablet (10 mg total) by mouth daily.   BLACK CURRANT SEED OIL PO Take 15 mLs by mouth daily as needed (inflammation).   CALCIUM 600 PO Take 1 tablet by mouth daily.   carvedilol 6.25 MG tablet Commonly known as:  COREG Take 1 tablet (6.25 mg total) by mouth 2 (two) times daily with a meal.   diphenhydrAMINE 25 mg capsule Commonly known as:  BENADRYL Take 25 mg by mouth every 6 (six) hours as needed for allergies.   famotidine 20 MG tablet Commonly known as:  PEPCID One at bedtime   fluticasone 110 MCG/ACT inhaler Commonly known as:  FLOVENT HFA Inhale 2 puffs into the lungs 2 (two) times daily. With spacer   furosemide 20 MG tablet Commonly known as:  LASIX Take 1 tablet (20 mg total) by mouth daily.   gabapentin 100 MG capsule Commonly known as:  NEURONTIN Take 2 capsules (200 mg total) by mouth at bedtime.   hydrOXYzine 25 MG tablet Commonly known as:  ATARAX/VISTARIL Take 1 tablet (25 mg total) by mouth 2 (two) times daily.   letrozole 2.5 MG tablet Commonly known as:  FEMARA Take 1 tablet (2.5 mg total) by mouth daily.   methocarbamol 500 MG tablet Commonly known as:  ROBAXIN Take 1 tablet (500 mg total) by  mouth 4 (four) times daily.   montelukast 10 MG tablet Commonly known as:  SINGULAIR Take 1 tablet (10 mg total) by mouth at bedtime.   palbociclib 75 MG capsule Commonly known as:  IBRANCE Take 1 capsule (75 mg total) by mouth daily. Take whole with food.   pantoprazole 40 MG tablet Commonly known as:  PROTONIX Take 1 tablet (40 mg total) by mouth daily. Take 30-60 min before first meal of the day   promethazine 25 MG tablet Commonly known as:  PHENERGAN Take 1 tablet (25 mg total) by mouth every 6 (six) hours as needed for nausea or  vomiting.   ranitidine 300 MG tablet Commonly known as:  ZANTAC Take 1 tablet (300 mg total) by mouth at bedtime.   traMADol 50 MG tablet Commonly known as:  ULTRAM Take 1 tablet (50 mg total) by mouth every 8 (eight) hours as needed.   Vitamin D3 5000 units Caps Take 1 capsule by mouth daily.       Past Medical History:  Diagnosis Date  . Anxiety   . Arthritis    hands, knees, hips  . Asthma   . Cancer ([place])   . Cervical stenosis (uterine cervix)   . Dyspnea on exertion   . GERD (gastroesophageal reflux disease)   . Headache(784.0)   . History of breast cancer    2010--  LEFT  s/p  mastectomy (in [place]) AND CHEMORADIATION--  NO RECURRENCE  . History of cervical dysplasia   . History of TB skin testing    AS TEEN--  [place]/ MEDS  . Hyperlipidemia   . Hypertension   . Malignant neoplasm of overlapping sites of left breast in female, estrogen receptor positive ([place]) 03/05/2013  . OSA (obstructive sleep apnea) moderate osa per study  09/2010   CPAP  , NOT USING ON REGULAR BASIS  . Pelvic pain in female   . Positive H. pylori test    08-05-2013  . Refusal of blood transfusions as patient is Jehov[employer]'s Witness   . Seasonal allergies   . Uterine fibroid   . Wears glasses     Past Surgical History:  Procedure Laterality Date  . CARDIOVASCULAR STRESS TEST  10-29-2012   low risk perfusion study/  no significant reversibity/ ef 66%/  normal wall motion  . CERVICAL CONIZATION W/BX  2012   in  [place] N/A 05/14/2012   Procedure: LAPAROSCOPIC CHOLECYSTECTOMY;  Surgeon: Ralene Ok, MD;  Location: [facility];  Service: General;  Laterality: N/A;  . COLONOSCOPY  2011   normal per patient - [place]  . DILATION AND CURETTAGE OF UTERUS  10/15/2011   Procedure: DILATATION AND CURETTAGE;  Surgeon: Melina Schools, MD;  Location: [facility];  Service: Gynecology;  Laterality: N/A;  Conization &  endocervical curettings  . EXAMINATION UNDER ANESTHESIA N/A 08/20/2013   Procedure: EXAM  UNDER ANESTHESIA;  Surgeon: Margarette Asal, MD;  Location: [facility];  Service: Gynecology;  Laterality: N/A;  . [place]   right  . LAPAROSCOPIC ASSISTED VAGINAL HYSTERECTOMY N/A 10/26/2014   Procedure: HYSTERECTOMY ABDOMINAL ;  Surgeon: Molli Posey, MD;  Location: [facility];  Service: Gynecology;  Laterality: N/A;  . MASTECTOMY Left 11/2008  in [place]   . SALPINGOOPHORECTOMY Bilateral 10/26/2014   Procedure: BILATERAL SALPINGO OOPHORECTOMY;  Surgeon: Molli Posey, MD;  Location: [facility];  Service: Gynecology;  Laterality: Bilateral;  . TRANSTHORACIC ECHOCARDIOGRAM  10-29-2012  mild lvh/  ef 60-65%/  grade II diastolic dysfunction/  trivial mr  &  tr    Review of systems negative except as noted in HPI / PMHx or noted below:  Review of Systems  Constitutional: Negative.   HENT: Negative.   Eyes: Negative.   Respiratory: Negative.   Cardiovascular: Negative.   Gastrointestinal: Negative.   Genitourinary: Negative.   Musculoskeletal: Negative.   Skin: Negative.   Neurological: Negative.   Endo/Heme/Allergies: Negative.   Psychiatric/Behavioral: Negative.      Objective:   Vitals:   01/02/17 1029  BP: 110/78  Pulse: 68  Resp: 18  SpO2: 97%          Physical Exam  Constitutional: She is well-developed, well-nourished, and in no distress.  HENT:  Head: Normocephalic.  Right Ear: Tympanic membrane, external ear and ear canal normal.  Left Ear: Tympanic membrane, external ear and ear canal normal.  Nose: Nose normal. No mucosal edema or rhinorrhea.  Mouth/Throat: Uvula is midline, oropharynx is clear and moist and mucous membranes are normal. No oropharyngeal exudate.  Eyes: Conjunctivae are normal.  Neck: Trachea normal. No tracheal tenderness present. No tracheal deviation present. No thyromegaly present.  Cardiovascular: Normal rate, regular rhythm, S1 normal, S2 normal and normal heart sounds.   No murmur  heard. Pulmonary/Chest: Breath sounds normal. No stridor. No respiratory distress. She has no wheezes. She has no rales.  Musculoskeletal: She exhibits no edema.  Lymphadenopathy:       Head (right side): No tonsillar adenopathy present.       Head (left side): No tonsillar adenopathy present.    She has no cervical adenopathy.  Neurological: She is alert. Gait normal.  Skin: No rash noted. She is not diaphoretic. No erythema. Nails show no clubbing.  Psychiatric: Mood and affect normal.    Diagnostics:    Spirometry was performed and demonstrated an FEV1 of 1.58 at 68 % of predicted.  Assessment and Plan:   1. Other allergic rhinitis   2. Asthma, well controlled, mild persistent   3. LPRD (laryngopharyngeal reflux disease)   4. Allergic urticaria     1. Continue to perform Allergen avoidance measures  2. Continue to Treat and prevent inflammation:   A. OTC Nasacort - 1 spray each nostril one time per day  B. Flovent 110 - 2 inhalations one time per day with spacer  C. montelukast 10 mg - one tablet one time per day  3. Continue to Treat and prevent reflux:   A. consolidate all chocolate and caffeine consumption  B. pantoprazole 40 mg tablet in a.m.  C. ranitidine 300 mg tablet in PM  4. If needed:   A. Hydroxyzine 25mg  tablet 1-2 times per day  B. OTC Benadryl  C. Proventil HFA or similar 2 inhalations every 4-6 hours  5. Return to clinic in 12 weeks or earlier if problem  From a respiratory standpoint and reflux standpoint and a pruritic standpoint Anetra is doing relatively well on her current plan and I have asked her to continue to utilize the therapy for 12 weeks. I suspected that at point in time if she does well there will be an opportunity to consolidate some of her treatment.  Allena Katz, MD [facility] / [facility] [place]

## 2017-01-02 NOTE — Progress Notes (Signed)
Dr. Jana Hakim, I referred the patient for an abdominal US due to a slight bump in her bilirubin and RUQ pain. These results were called to [employer] and were reviewed with her . Her were answered. She expressed understanding.   Lucianne Lei

## 2017-01-03 ENCOUNTER — Encounter: Payer: Self-pay | Admitting: Allergy and Immunology

## 2017-01-07 NOTE — Telephone Encounter (Signed)
Called pt & told her I have now filed her MCD - it had been termed - kt

## 2017-01-09 ENCOUNTER — Ambulatory Visit: Payer: Medicare Other | Admitting: Oncology

## 2017-01-09 ENCOUNTER — Other Ambulatory Visit: Payer: Medicare Other

## 2017-01-09 ENCOUNTER — Ambulatory Visit: Payer: Medicare Other

## 2017-01-11 NOTE — Addendum Note (Signed)
Addended by: Herbie Drape on: 01/11/2017 04:29 PM   Modules accepted: Orders

## 2017-01-15 ENCOUNTER — Other Ambulatory Visit: Payer: Self-pay | Admitting: Oncology

## 2017-01-15 DIAGNOSIS — C7951 Secondary malignant neoplasm of bone: Secondary | ICD-10-CM

## 2017-01-17 ENCOUNTER — Ambulatory Visit: Payer: Self-pay

## 2017-01-17 ENCOUNTER — Telehealth: Payer: Self-pay | Admitting: Hematology

## 2017-01-17 ENCOUNTER — Ambulatory Visit: Payer: Self-pay | Admitting: Oncology

## 2017-01-17 ENCOUNTER — Ambulatory Visit (HOSPITAL_BASED_OUTPATIENT_CLINIC_OR_DEPARTMENT_OTHER): Payer: Medicare Other

## 2017-01-17 ENCOUNTER — Ambulatory Visit (HOSPITAL_BASED_OUTPATIENT_CLINIC_OR_DEPARTMENT_OTHER): Payer: Medicare Other | Admitting: Hematology

## 2017-01-17 ENCOUNTER — Other Ambulatory Visit (HOSPITAL_BASED_OUTPATIENT_CLINIC_OR_DEPARTMENT_OTHER): Payer: Medicare Other

## 2017-01-17 ENCOUNTER — Other Ambulatory Visit: Payer: Medicare Other

## 2017-01-17 ENCOUNTER — Encounter: Payer: Self-pay | Admitting: Hematology

## 2017-01-17 VITALS — BP 123/69 | HR 61 | Temp 98.1°F | Resp 19 | Ht 67.0 in | Wt 281.8 lb

## 2017-01-17 DIAGNOSIS — G4700 Insomnia, unspecified: Secondary | ICD-10-CM | POA: Diagnosis not present

## 2017-01-17 DIAGNOSIS — R0781 Pleurodynia: Secondary | ICD-10-CM | POA: Diagnosis not present

## 2017-01-17 DIAGNOSIS — I10 Essential (primary) hypertension: Secondary | ICD-10-CM | POA: Diagnosis not present

## 2017-01-17 DIAGNOSIS — R0609 Other forms of dyspnea: Secondary | ICD-10-CM | POA: Diagnosis not present

## 2017-01-17 DIAGNOSIS — N644 Mastodynia: Secondary | ICD-10-CM | POA: Diagnosis not present

## 2017-01-17 DIAGNOSIS — C50912 Malignant neoplasm of unspecified site of left female breast: Secondary | ICD-10-CM

## 2017-01-17 DIAGNOSIS — Z23 Encounter for immunization: Secondary | ICD-10-CM

## 2017-01-17 DIAGNOSIS — C7951 Secondary malignant neoplasm of bone: Secondary | ICD-10-CM

## 2017-01-17 DIAGNOSIS — C50812 Malignant neoplasm of overlapping sites of left female breast: Secondary | ICD-10-CM | POA: Diagnosis not present

## 2017-01-17 DIAGNOSIS — Z170 Estrogen receptor positive status [ER+]: Secondary | ICD-10-CM

## 2017-01-17 DIAGNOSIS — R609 Edema, unspecified: Secondary | ICD-10-CM | POA: Diagnosis not present

## 2017-01-17 DIAGNOSIS — Z7189 Other specified counseling: Secondary | ICD-10-CM

## 2017-01-17 LAB — COMPREHENSIVE METABOLIC PANEL
ALT: 8 U/L (ref 0–55)
ANION GAP: 7 meq/L (ref 3–11)
AST: 15 U/L (ref 5–34)
Albumin: 3.7 g/dL (ref 3.5–5.0)
Alkaline Phosphatase: 89 U/L (ref 40–150)
BILIRUBIN TOTAL: 0.82 mg/dL (ref 0.20–1.20)
BUN: 12.7 mg/dL (ref 7.0–26.0)
CALCIUM: 8.5 mg/dL (ref 8.4–10.4)
CHLORIDE: 107 meq/L (ref 98–109)
CO2: 26 meq/L (ref 22–29)
CREATININE: 0.8 mg/dL (ref 0.6–1.1)
Glucose: 94 mg/dl (ref 70–140)
Potassium: 4.2 mEq/L (ref 3.5–5.1)
Sodium: 140 mEq/L (ref 136–145)
Total Protein: 7.1 g/dL (ref 6.4–8.3)

## 2017-01-17 MED ORDER — INFLUENZA VAC SPLIT QUAD 0.5 ML IM SUSY
0.5000 mL | PREFILLED_SYRINGE | Freq: Once | INTRAMUSCULAR | Status: AC
  Administered 2017-01-17: 0.5 mL via INTRAMUSCULAR
  Filled 2017-01-17: qty 0.5

## 2017-01-17 MED ORDER — DENOSUMAB 120 MG/1.7ML ~~LOC~~ SOLN
120.0000 mg | Freq: Once | SUBCUTANEOUS | Status: AC
  Administered 2017-01-17: 120 mg via SUBCUTANEOUS
  Filled 2017-01-17: qty 1.7

## 2017-01-17 NOTE — Progress Notes (Signed)
Michaela Little  [phone] Fax:(336) [phone]  Clinic Follow up Note   Patient Care Team: [name] as PCP - General (Physician Assistant) [name], MD as Consulting Physician (Pulmonary Disease) [name], MD as Consulting Physician (Hematology) 01/17/2017  SUMMARY OF ONCOLOGIC HISTORY: Oncology History   Cancer Staging Malignant neoplasm of overlapping sites of left breast in female, estrogen receptor positive (B and E) Staging form: Breast, AJCC 7th Edition - Clinical: Stage IIIA (T2, N2, M0) - Unsigned       Malignant neoplasm of overlapping sites of left breast in female, estrogen receptor positive ([place])   05/2008 Cancer Diagnosis    Left sided breast cancer (no path report available)      11/2008 Pathologic Stage    Stage IIIA: T2 N2       Neo-Adjuvant Chemotherapy    Paclitaxel weekly x 12; doxorubicin and cyclophosphamide x 4 - both given with trifiparnib (treated at [facility] in [place])      11/2008 Definitive Surgery    Left modified radiation mastectomy: invasive lobular carcinoma, ER+, PR+, HER2/neu negative, 5/22 LN positive       - 03/2009 Radiation Therapy    Adjuvant radiation to left chest wall      2011 - 08/2014 Anti-estrogen oral therapy    Tamoxifen 20 mg used briefly; discontinued due to uterine lining concerns; changed to anastrozole 1 mg daily (began 07/2100); discontinued by patient summer of 2016      05/26/2015 Progression    Iliac bone biopsy showed metastatic carcinoma consistent with a breast primary, ER positive, PR and HER-2 negative. Her staging CT, and a PET scan was negative for visceral metastasis.      05/2015 -  Anti-estrogen oral therapy    Patient started letrozole and Ibrance. I brance dose reduced to 118m due to cytopenia       11/16/2015 Miscellaneous    Xgeva monthly for bone metastasis       07/03/2016 Imaging    CT chest, abdomen and pelvis with contrast showed no evidence  of metastasis.        CURRENT TREATMENT:   1. letrozole and Ibrance started March 2017             -Ibrance dose decreased to 75 mg daily 2. Xgeva started 11/16/2015, repeated monthly   INTERVAL HISTORY:  Michaela STANKOVICis here for a follow up of her metastatic breast cancer. I am taking over her care from Dr. MJana Hakim This is my first time seeing her.   Today she is on her off week of Ibrance and is on 775m She notes she feels exhausted and is often SOB. She has been having right sided abdominal pain that was worse 2 weeks ago, she say symptom management clinic and ER. She has been having ongoing pain in the right groin area but worsened lately. She is also having pain around her left lower ribs. She has not tried gabapentin and she takes tramadol as needed for pain. Right now her pain is tolerable. Sometime her pain can be past 10/10 but now the pain is 6-7/10 She is taking her BP medication for her HTN, she started Flovent due to her current trouble breathing, given by Dr. WeMelvyn NovasHer legs swell but no doppler has been done before. She also gets a lot of fluid in her stomach. She is also on Lasix, given by her PCP, Dr. GoAltamease OilerShe has arthritis. She still has tissue expander in left  breast where she has some pain and tenderness. Her PCP changed her heart medication because her heart rate was slow when he last saw her. She would like to get to the bottom of her issues as she has difficulty falling asleep at night.   She lives by herself in a community residence. She lives in [place] but still also have doctors and family in [place] as well. She has 5 children. She does not drive. She does have SCAT forms to be filled out.     REVIEW OF SYSTEMS:   Constitutional: Denies fevers, chills or abnormal weight loss (+) Fatigue Eyes: Denies blurriness of vision Ears, nose, mouth, throat, and face: Denies mucositis or sore throat Respiratory: Denies cough or wheezes (+) SOB  Cardiovascular: Denies  palpitation, chest discomfort (+) Slight lower leg edema Gastrointestinal:  Denies nausea, heartburn or change in bowel habits (+) right side abdominal pain   MSK: (+) pain in left ribs Breast: (+) Still have expander in left breast, has pain/tenderness in left breast Skin: Denies abnormal skin rashes Lymphatics: Denies new lymphadenopathy or easy bruising Neurological:Denies numbness, tingling or new weaknesses Behavioral/Psych: Mood is stable, no new changes  All other systems were reviewed with the patient and are negative.  MEDICAL HISTORY:  Past Medical History:  Diagnosis Date  . Anxiety   . Arthritis    hands, knees, hips  . Asthma   . Cancer ([place])   . Cervical stenosis (uterine cervix)   . Dyspnea on exertion   . GERD (gastroesophageal reflux disease)   . Headache(784.0)   . History of breast cancer    2010--  LEFT  s/p  mastectomy (in [place]) AND CHEMORADIATION--  NO RECURRENCE  . History of cervical dysplasia   . History of TB skin testing    [place]--  [place]/ [place]  . Hyperlipidemia   . Hypertension   . Malignant neoplasm of overlapping sites of left breast in female, estrogen receptor positive ([place]) 03/05/2013  . OSA (obstructive sleep apnea) moderate osa per study  09/2010   CPAP  , NOT USING ON REGULAR BASIS  . Pelvic pain in female   . Positive H. pylori test    08-05-2013  . Refusal of blood transfusions as patient is Jehovah's Witness   . Seasonal allergies   . Uterine fibroid   . Wears glasses     SURGICAL HISTORY: Past Surgical History:  Procedure Laterality Date  . CARDIOVASCULAR STRESS TEST  10-29-2012   low risk perfusion study/  no significant reversibity/ ef 66%/  normal wall motion  . CERVICAL CONIZATION W/BX  2012   [place] N/A 05/14/2012   Procedure: LAPAROSCOPIC CHOLECYSTECTOMY;  Surgeon: Ralene Ok, MD;  Location: [facility];  Service: General;  Laterality: N/A;  . COLONOSCOPY  2011   normal per patient - [place]  . DILATION AND  CURETTAGE OF UTERUS  10/15/2011   Procedure: DILATATION AND CURETTAGE;  Surgeon: Melina Schools, MD;  Location: [facility];  Service: Gynecology;  Laterality: N/A;  Conization &  endocervical curettings  . EXAMINATION UNDER ANESTHESIA N/A 08/20/2013   Procedure: EXAM UNDER ANESTHESIA;  Surgeon: Margarette Asal, MD;  Location: [facility];  Service: Gynecology;  Laterality: N/A;  . [place]   right  . LAPAROSCOPIC ASSISTED VAGINAL HYSTERECTOMY N/A 10/26/2014   Procedure: HYSTERECTOMY ABDOMINAL ;  Surgeon: Molli Posey, MD;  Location: [facility];  Service: Gynecology;  Laterality: N/A;  . MASTECTOMY  Left 11/2008  in [place]   . SALPINGOOPHORECTOMY Bilateral 10/26/2014   Procedure: BILATERAL SALPINGO OOPHORECTOMY;  Surgeon: Molli Posey, MD;  Location: [facility];  Service: Gynecology;  Laterality: Bilateral;  . TRANSTHORACIC ECHOCARDIOGRAM  10-29-2012   mild lvh/  ef 60-65%/  grade II diastolic dysfunction/  trivial mr  &  tr    I have reviewed the social history and family history with the patient and they are unchanged from previous note.  ALLERGIES:  is allergic to lisinopril-hydrochlorothiazide; adhesive [tape]; fentanyl; hctz [hydrochlorothiazide]; latex; lisinopril; losartan potassium; and oxycodone.  MEDICATIONS:  Current Outpatient Prescriptions  Medication Sig Dispense Refill  . albuterol (PROVENTIL HFA;VENTOLIN HFA) 108 (90 Base) MCG/ACT inhaler Inhale 2 puffs into the lungs every 6 (six) hours as needed for wheezing or shortness of breath.    Marland Kitchen amLODipine (NORVASC) 10 MG tablet Take 1 tablet (10 mg total) by mouth daily. 90 tablet 3  . BLACK CURRANT SEED OIL PO Take 15 mLs by mouth daily as needed (inflammation).    . Calcium Carbonate (CALCIUM 600 PO) Take 1 tablet by mouth daily.     . carvedilol (COREG) 6.25 MG tablet Take 1 tablet (6.25 mg total) by mouth 2 (two) times daily with a meal. 180 tablet 3  . Cholecalciferol (VITAMIN D3) 5000 units  CAPS Take 1 capsule by mouth daily.     . diphenhydrAMINE (BENADRYL) 25 mg capsule Take 25 mg by mouth every 6 (six) hours as needed for allergies.    . famotidine (PEPCID) 20 MG tablet One at bedtime (Patient taking differently: Take 20 mg by mouth at bedtime. One at bedtime) 30 tablet 2  . fluticasone (FLOVENT HFA) 110 MCG/ACT inhaler Inhale 2 puffs into the lungs 2 (two) times daily. With spacer 1 Inhaler 5  . furosemide (LASIX) 20 MG tablet Take 1 tablet (20 mg total) by mouth daily. 90 tablet 3  . gabapentin (NEURONTIN) 100 MG capsule Take 2 capsules (200 mg total) by mouth at bedtime. 60 capsule 2  . hydrOXYzine (ATARAX/VISTARIL) 25 MG tablet Take 1 tablet (25 mg total) by mouth 2 (two) times daily. 40 tablet 0  . letrozole (FEMARA) 2.5 MG tablet Take 1 tablet (2.5 mg total) by mouth daily. 90 tablet 4  . methocarbamol (ROBAXIN) 500 MG tablet Take 1 tablet (500 mg total) by mouth 4 (four) times daily. 30 tablet 0  . montelukast (SINGULAIR) 10 MG tablet Take 1 tablet (10 mg total) by mouth at bedtime. (Patient not taking: Reported on 01/02/2017) 30 tablet 5  . palbociclib (IBRANCE) 75 MG capsule Take 1 capsule (75 mg total) by mouth daily. Take whole with food. 21 capsule 0  . pantoprazole (PROTONIX) 40 MG tablet Take 1 tablet (40 mg total) by mouth daily. Take 30-60 min before first meal of the day 30 tablet 2  . promethazine (PHENERGAN) 25 MG tablet Take 1 tablet (25 mg total) by mouth every 6 (six) hours as needed for nausea or vomiting. 120 tablet 0  . ranitidine (ZANTAC) 300 MG tablet Take 1 tablet (300 mg total) by mouth at bedtime. 30 tablet 5  . traMADol (ULTRAM) 50 MG tablet Take 1 tablet (50 mg total) by mouth every 8 (eight) hours as needed. 30 tablet 0   No current facility-administered medications for this visit.     PHYSICAL EXAMINATION: ECOG PERFORMANCE STATUS: 2 - Symptomatic, <50% confined to bed  Vitals:   01/17/17 1418  BP: 123/69  Pulse: 61  Resp: 19  Temp: 98.1 F  (36.7 C)  SpO2: 100%   Filed Weights   01/17/17 1418  Weight: 281 lb 12.8 oz (127.8 kg)    GENERAL:alert, no distress and comfortable SKIN: skin color, texture, turgor are normal, no rashes or significant lesions EYES: normal, Conjunctiva are pink and non-injected, sclera clear OROPHARYNX:no exudate, no erythema and lips, buccal mucosa, and tongue normal  NECK: supple, thyroid normal size, non-tender, without nodularity LYMPH:  no palpable lymphadenopathy in the cervical, axillary or inguinal LUNGS: clear to auscultation and percussion with normal breathing effort HEART: regular rate & rhythm and no murmurs and no lower extremity edema ABDOMEN:abdomen soft, non-tender and normal bowel sounds (+) significant tenderness in right lateral rib cage, mild tenderness in left lateral rib, abdominal exam negative.  Musculoskeletal:no cyanosis of digits and no clubbing  NEURO: alert & oriented x 3 with fluent speech, no focal motor/sensory deficits Breast: (+) S/p left mastectomy: She has mild tenderness around the tissue expander, no palpable mass or adenopathy. right breast exam negative    LABORATORY DATA:  I have reviewed the data as listed CBC Latest Ref Rng & Units 12/28/2016 12/20/2016 12/12/2016  WBC 3.9 - 10.3 10e3/uL 3.8(L) 5.3 2.9(L)  Hemoglobin 11.6 - 15.9 g/dL 11.7 11.8 12.3  Hematocrit 34.8 - 46.6 % 35.0 35.0 36.1  Platelets 145 - 400 10e3/uL 253 252 188     CMP Latest Ref Rng & Units 01/17/2017 12/28/2016 12/20/2016  Glucose 70 - 140 mg/dl 94 138 130  BUN 7.0 - 26.0 mg/dL 12.7 7.9 15.3  Creatinine 0.6 - 1.1 mg/dL 0.8 0.8 0.9  Sodium 136 - 145 mEq/L 140 141 141  Potassium 3.5 - 5.1 mEq/L 4.2 3.6 3.8  Chloride 101 - 111 mmol/L - - -  CO2 22 - 29 mEq/L 26 24 25   Calcium 8.4 - 10.4 mg/dL 8.5 8.7 9.0  Total Protein 6.4 - 8.3 g/dL 7.1 7.2 7.2  Total Bilirubin 0.20 - 1.20 mg/dL 0.82 1.43(H) 0.79  Alkaline Phos 40 - 150 U/L 89 88 82  AST 5 - 34 U/L 15 27 13   ALT 0 - 55 U/L 8 23  6       RADIOGRAPHIC STUDIES: I have personally reviewed the radiological images as listed and agreed with the findings in the report. No results found.   ASSESSMENT & PLAN:  Michaela Little is a 56 y.o. female with a history of HTN, HLD, Arthritis, Asthma, Sleep Apnea, and H. Pylori.   1. Metastatic Breast Cancer to the bone -I have reviewed her oncology history extensively, and confirmed the key  findings with patient. -She has metastatic ER positive breast cancer to bones, has been on letrozole and Ibrance since March 2017. She has had her oncological care post in [place], her last PET scan was in December 2017, lost a CT scan was in April 2018. Due to her worsening bilateral rib cage pain, I'm concerning for disease progression in her bones, I recommend to get  -H restaging CT abdomen and pelvis with contrast, and bone scan for further evaluation. She had CT chest one month ago which was negative  -Her last Xgeva was 12/20/16 and she will proceed with it today,  and continue monthly  -Labs reviewed today and CMP is WNL.  -She will continue with Letrozole and Ibrance to next visit and we will discuss treatment options after scan results.  -f/u in 4 weeks    2. Primary breast cancer of left breast  overlapping sites diagnosed March of 2010 and treated neo-adjuvantly at [facility] with (a) paclitaxel weekly x12 and (b) doxorubicin/ cyclophosphamide in dose dense fashion x4, both given with tifiparnib.  -definitive left modified radical mastectomy September of 2010 for a T2 N2 or stage IIIA invasive lobular breast cancer, estrogen and progesterone receptor positive, HER-2 negative,  -post mastectomy radiation completed January of 2011, -briefly on tamoxifen, discontinued it because of uterine lining concerns.  On anastrozole since May 2012 with good tolerance. Held between November and December 2013 due to GI issues, unrelated to cancer or its treatment. Discontinued by the  patient summer of 2016  3. Bilateral rib cage pain, Left Breast pain -Pain current 6-7/10 -She takes tramadol as needed -will restaging her to rule out progression of bone metastasis   4. Lower Extremity Edema -She is on Lasix, given by her PCP Dr. Altamease Oiler  5. Dyspnea  -Uses Flovent -I will do a ECHO to rule out cardiomyopathy, she had chemo Adramycin before which can cause heart failure   6. Insomnia  -She is on hydroxyzine already  -I suggest her to try melatonin   7. Goal of care discussion  -We discussed the incurable nature of her cancer, and the overall poor prognosis, especially if she does not have good response to chemotherapy or progress on chemo -The patient understands the goal of care is palliative. -she is full code now   PLAN:  -Filled out patient SCAT form today -Xgeva injection today  -Continue Ibrance and Letrozole  -CT abdomen and pelvis w contrast and bone scan in 2 weeks -Echo in 2 weeks -Lab,f/u and Xgeva injection in 4 weeks     Orders Placed This Encounter  Procedures  . CT Abdomen Pelvis W Contrast    Standing Status:   Future    Standing Expiration Date:   01/17/2018    Order Specific Question:   If indicated for the ordered procedure, I authorize the administration of contrast media per Radiology protocol    Answer:   Yes    Order Specific Question:   Is patient pregnant?    Answer:   No    Order Specific Question:   Preferred imaging location?    Answer:   [facility]    Order Specific Question:   Radiology Contrast Protocol - do NOT remove file path    Answer:   \\[email or web address]  . NM Bone Scan Whole Body    Standing Status:   Future    Standing Expiration Date:   01/17/2018    Order Specific Question:   If indicated for the ordered procedure, I authorize the administration of a radiopharmaceutical per Radiology protocol    Answer:   Yes    Order Specific Question:   Is the patient pregnant?     Answer:   No    Order Specific Question:   Preferred imaging location?    Answer:   [facility]    Order Specific Question:   Radiology Contrast Protocol - do NOT remove file path    Answer:   \\[email or web address]    Order Specific Question:   Is patient pregnant?    Answer:   No  . CBC with Differential    Standing Status:   Standing    Number of Occurrences:   50    Standing Expiration Date:   01/17/2022  . Comprehensive metabolic panel    Standing Status:   Standing    Number of Occurrences:   50  Standing Expiration Date:   01/17/2022  . CA 27.29    Standing Status:   Standing    Number of Occurrences:   50    Standing Expiration Date:   01/17/2022  . ECHOCARDIOGRAM COMPLETE    Standing Status:   Future    Standing Expiration Date:   04/19/2018    Scheduling Instructions:     Schedule in 3 weeks, dyspnea on exertion, leg swelling, history of chemo for breast cancer,  rule out heart failure    Order Specific Question:   Where should this test be performed    Answer:   [place]    Order Specific Question:   Perflutren DEFINITY (image enhancing agent) should be administered unless hypersensitivity or allergy exist    Answer:   Administer Perflutren    Order Specific Question:   Expected Date:    Answer:   1 month   All questions were answered. The patient knows to call the clinic with any problems, questions or concerns. No barriers to learning was detected.  I spent 30 minutes counseling the patient face to face. The total time spent in the appointment was 40 minutes and more than 50% was on counseling and review of test results     [name], MD 01/17/2017    This document serves as a record of services personally performed by [name], MD. It was created on her behalf by Joslyn Devon, a trained medical scribe. The creation of this record is based on the scribe's personal observations and the provider's statements to them. This document  has been checked and approved by the attending provider.

## 2017-01-17 NOTE — Telephone Encounter (Signed)
Scheduled appt per 10/25 los - gave patient AVS and calender per los.  

## 2017-01-20 ENCOUNTER — Encounter: Payer: Self-pay | Admitting: Hematology

## 2017-01-20 DIAGNOSIS — Z7189 Other specified counseling: Secondary | ICD-10-CM | POA: Insufficient documentation

## 2017-01-22 ENCOUNTER — Ambulatory Visit (HOSPITAL_COMMUNITY)
Admission: RE | Admit: 2017-01-22 | Discharge: 2017-01-22 | Disposition: A | Discharge status: Home / Self Care | Payer: Medicare Other | Source: Ambulatory Visit | Attending: Hematology | Admitting: Hematology

## 2017-01-22 DIAGNOSIS — R0609 Other forms of dyspnea: Secondary | ICD-10-CM | POA: Diagnosis not present

## 2017-01-22 DIAGNOSIS — I5030 Unspecified diastolic (congestive) heart failure: Secondary | ICD-10-CM | POA: Diagnosis not present

## 2017-01-22 NOTE — Progress Notes (Signed)
  Echocardiogram 2D Echocardiogram has been performed.  Michaela Little 01/22/2017, 11:57 AM

## 2017-01-23 ENCOUNTER — Other Ambulatory Visit: Payer: Self-pay

## 2017-01-23 MED ORDER — PALBOCICLIB 75 MG PO CAPS: 75.0000 mg | ORAL_CAPSULE | Freq: Every day | ORAL | 0 refills | Status: DC

## 2017-01-25 ENCOUNTER — Telehealth: Payer: Self-pay

## 2017-01-25 ENCOUNTER — Other Ambulatory Visit: Payer: Self-pay | Admitting: Hematology

## 2017-01-25 NOTE — Telephone Encounter (Signed)
Pt requesting ibrance refill to [facility]

## 2017-01-25 NOTE — Telephone Encounter (Signed)
Script was faxed on 01/23/17.  [facility] was told that it is in process to be shipped.  Informed pt.

## 2017-01-29 ENCOUNTER — Telehealth: Payer: Self-pay | Admitting: *Deleted

## 2017-01-29 NOTE — Telephone Encounter (Signed)
Message transferred from [facility] per pt's call stating she has not received her Ibrance prescription.  Note post retrieving message pt has transferred her care to Dr Burr Medico.  This note will be sent to Dr Ernestina Penna nurse for appropriate follow up and pt contact.  Pt left return call number as [phone].

## 2017-01-31 ENCOUNTER — Other Ambulatory Visit: Payer: Self-pay | Admitting: Hematology

## 2017-01-31 DIAGNOSIS — C50912 Malignant neoplasm of unspecified site of left female breast: Secondary | ICD-10-CM

## 2017-01-31 DIAGNOSIS — C7951 Secondary malignant neoplasm of bone: Principal | ICD-10-CM

## 2017-02-07 ENCOUNTER — Encounter (HOSPITAL_COMMUNITY): Payer: Self-pay

## 2017-02-07 ENCOUNTER — Encounter (HOSPITAL_COMMUNITY)
Admission: RE | Admit: 2017-02-07 | Discharge: 2017-02-07 | Disposition: A | Discharge status: Home / Self Care | Payer: Medicare Other | Source: Ambulatory Visit | Attending: Hematology | Admitting: Hematology

## 2017-02-07 ENCOUNTER — Ambulatory Visit (HOSPITAL_COMMUNITY)
Admission: RE | Admit: 2017-02-07 | Discharge: 2017-02-07 | Disposition: A | Discharge status: Home / Self Care | Payer: Medicare Other | Source: Ambulatory Visit | Attending: Hematology | Admitting: Hematology

## 2017-02-07 DIAGNOSIS — C7951 Secondary malignant neoplasm of bone: Secondary | ICD-10-CM | POA: Diagnosis not present

## 2017-02-07 DIAGNOSIS — C50912 Malignant neoplasm of unspecified site of left female breast: Secondary | ICD-10-CM | POA: Diagnosis not present

## 2017-02-07 DIAGNOSIS — C50919 Malignant neoplasm of unspecified site of unspecified female breast: Secondary | ICD-10-CM | POA: Diagnosis not present

## 2017-02-07 DIAGNOSIS — Z9049 Acquired absence of other specified parts of digestive tract: Secondary | ICD-10-CM | POA: Diagnosis not present

## 2017-02-07 DIAGNOSIS — R109 Unspecified abdominal pain: Secondary | ICD-10-CM | POA: Diagnosis not present

## 2017-02-07 MED ORDER — TECHNETIUM TC 99M MEDRONATE IV KIT
20.7000 | PACK | Freq: Once | INTRAVENOUS | Status: AC | PRN
  Administered 2017-02-07: 20.7 via INTRAVENOUS

## 2017-02-07 MED ORDER — IOPAMIDOL (ISOVUE-300) INJECTION 61%
INTRAVENOUS | Status: AC
  Administered 2017-02-07: 100 mL via INTRAVENOUS
  Filled 2017-02-07: qty 100

## 2017-02-07 MED ORDER — IOPAMIDOL (ISOVUE-300) INJECTION 61%
INTRAVENOUS | Status: AC
  Filled 2017-02-07: qty 30

## 2017-02-12 NOTE — Progress Notes (Signed)
[facility]  Telephone:([phone]) [phone] Fax:(336) [phone]  Clinic Follow up Note   Patient Care Team: [name] as PCP - General (Physician Assistant) [name], MD as Consulting Physician (Pulmonary Disease) [name], MD as Consulting Physician (Hematology) 02/13/2017   CHIEF COMPLAIN: F/U metastatic breast cancer   SUMMARY OF ONCOLOGIC HISTORY: Oncology History   Cancer Staging Malignant neoplasm of overlapping sites of left breast in female, estrogen receptor positive ([place]) Staging form: Breast, AJCC 7th Edition - Clinical: Stage IIIA (T2, N2, M0) - Unsigned       Malignant neoplasm of overlapping sites of left breast in female, estrogen receptor positive ([place])   05/2008 Cancer Diagnosis    Left sided breast cancer (no path report available)      11/2008 Pathologic Stage    Stage IIIA: T2 N2       Neo-Adjuvant Chemotherapy    Paclitaxel weekly x 12; doxorubicin and cyclophosphamide x 4 - both given with trifiparnib (treated at [facility] in [place])      11/2008 Definitive Surgery    Left modified radiation mastectomy: invasive lobular carcinoma, ER+, PR+, HER2/neu negative, 5/22 LN positive       - 03/2009 Radiation Therapy    Adjuvant radiation to left chest wall      2011 - 08/2014 Anti-estrogen oral therapy    Tamoxifen 20 mg used briefly; discontinued due to uterine lining concerns; changed to anastrozole 1 mg daily (began 07/2100); discontinued by patient summer of 2016      05/26/2015 Progression    Iliac bone biopsy showed metastatic carcinoma consistent with a breast primary, ER positive, PR and HER-2 negative. Her staging CT, and a PET scan was negative for visceral metastasis.      05/2015 -  Anti-estrogen oral therapy    Patient started letrozole and Ibrance. I brance dose reduced to 161m due to cytopenia  Changed letrozole to Exemestane due to increase in bone pain.       11/16/2015 Miscellaneous    Xgeva monthly  for bone metastasis       07/03/2016 Imaging    CT chest, abdomen and pelvis with contrast showed no evidence of metastasis.       02/07/2017 Imaging    CT AP W Contrast 02/07/17 IMPRESSION: 1. No CT findings for abdominal/pelvic metastatic disease. 2. No acute abdominal findings, mass lesions or adenopathy. 3. Status post cholecystectomy with mild associated common bile duct dilatation. 4. Somewhat thickened appendix appears relatively stable. No acute inflammatory process. 5. Stable mixed lytic and sclerotic osseous metastatic disease.       02/07/2017 Imaging    Bone Scan Whole Body 02/07/17 IMPRESSION: No scintigraphic evidence skeletal metastasis        INTERIM HISTORY: 01/17/17 Michaela Little here for a follow up of her metastatic breast cancer. I am taking over her care from Dr. MJana Hakim This is my first time seeing her.   Today she is on her off week of Ibrance and is on 725m She notes she feels exhausted and is often SOB. She has been having right sided abdominal pain that was worse 2 weeks ago, she say symptom management clinic and ER. She has been having ongoing pain in the right groin area but worsened lately. She is also having pain around her left lower ribs. She has not tried gabapentin and she takes tramadol as needed for pain. Right now her pain is tolerable. Sometime her pain can be past 10/10  but now the pain is 6-7/10 She is taking her BP medication for her HTN, she started Flovent due to her current trouble breathing, given by Dr. Melvyn Novas. Her legs swell but no doppler has been done before. She also gets a lot of fluid in her stomach. She is also on Lasix, given by her PCP, Dr. Altamease Oiler. She has arthritis. She still has tissue expander in left breast where she has some pain and tenderness. Her PCP changed her heart medication because her heart rate was slow when he last saw her. She would like to get to the bottom of her issues as she has difficulty falling asleep  at night.  She lives by herself in a community residence. She lives in [place] but still also have doctors and family in [place] as well. She has 5 children. She does not drive. She does have SCAT forms to be filled out.    CURRENT TREATMENT:   1. letrozole and Ibrance started March 2017             -Ibrance dose decreased to 75 mg daily  -Letrozole switched to Exemestane on 02/13/17 due to b/l rib pain.  2. Xgeva started 11/16/2015, repeated monthly   INTERVAL HISTORY:  Michaela Little is here for a follow up of her metastatic breast cancer. She presents to the clinic today to discuss her scan results. She was accompanied by a friend.  Patient notes she has felt sick previously. She was given a contrast for oral and IV contrast and afterward she was throwing up with diarrhea. This is the first time this has occurred to her after taking contrast. She notes rib pain (08/2016) bilaterally now when before it was in just the left side before. This is exacerbated when she walks. She says it feels heavy in that area. She does not want to take narcotics. She is on Ibrance this week and will finish this cycle on 11/30 and will start back 12/8.  She notes pain in her throat with Dysphagia swelling in her throat. She notes to having lesions on her thyroid. She notes swelling in her feet even with lasix. She is willing to try compression socks and elevating her feet.   REVIEW OF SYSTEMS:   Constitutional: Denies fevers, chills or abnormal weight loss  Eyes: Denies blurriness of vision Ears, nose, mouth, throat, and face: Denies mucositis (+) Sore throat (+) dysphagia  Respiratory: Denies cough or wheezes (+) SOB  Cardiovascular: Denies palpitation, chest discomfort (+) Slight lower leg edema Gastrointestinal:  Denies nausea, heartburn or change in bowel habits (+) right side abdominal pain  MSK: (+) pain in left ribs, now bilateral  Breast: (+) Still have expander in left breast, has pain/tenderness in  left breast Skin: Denies abnormal skin rashes Lymphatics: Denies new lymphadenopathy or easy bruising (+) thyroid nodule palpable  Neurological:Denies numbness, tingling or new weaknesses Behavioral/Psych: Mood is stable, no new changes  All other systems were reviewed with the patient and are negative.  MEDICAL HISTORY:  Past Medical History:  Diagnosis Date  . Anxiety   . Arthritis    hands, knees, hips  . Asthma   . Cancer ([place])   . Cervical stenosis (uterine cervix)   . Dyspnea on exertion   . GERD (gastroesophageal reflux disease)   . Headache(784.0)   . History of breast cancer    2010--  LEFT  s/p  mastectomy (in [place]) AND CHEMORADIATION--  NO RECURRENCE  . History of cervical dysplasia   .  History of TB skin testing    AS TEEN--  [place]/ MEDS  . Hyperlipidemia   . Hypertension   . Malignant neoplasm of overlapping sites of left breast in female, estrogen receptor positive ([place]) 03/05/2013  . OSA (obstructive sleep apnea) moderate osa per study  09/2010   CPAP  , NOT USING ON REGULAR BASIS  . Pelvic pain in female   . Positive H. pylori test    08-05-2013  . Refusal of blood transfusions as patient is Jehovah's Witness   . Seasonal allergies   . Uterine fibroid   . Wears glasses     SURGICAL HISTORY: Past Surgical History:  Procedure Laterality Date  . CARDIOVASCULAR STRESS TEST  10-29-2012   low risk perfusion study/  no significant reversibity/ ef 66%/  normal wall motion  . CERVICAL CONIZATION W/BX  2012   in  [place] N/A 05/14/2012   Procedure: LAPAROSCOPIC CHOLECYSTECTOMY;  Surgeon: Ralene Ok, MD;  Location: [facility];  Service: General;  Laterality: N/A;  . COLONOSCOPY  2011   normal per patient - [place]  . DILATION AND CURETTAGE OF UTERUS  10/15/2011   Procedure: DILATATION AND CURETTAGE;  Surgeon: Melina Schools, MD;  Location: [facility];  Service: Gynecology;  Laterality: N/A;  Conization &  endocervical curettings  . EXAMINATION UNDER ANESTHESIA  N/A 08/20/2013   Procedure: EXAM UNDER ANESTHESIA;  Surgeon: Margarette Asal, MD;  Location: [facility];  Service: Gynecology;  Laterality: N/A;  . [place]   right  . LAPAROSCOPIC ASSISTED VAGINAL HYSTERECTOMY N/A 10/26/2014   Procedure: HYSTERECTOMY ABDOMINAL ;  Surgeon: Molli Posey, MD;  Location: [facility];  Service: Gynecology;  Laterality: N/A;  . MASTECTOMY Left 11/2008  in [place]   . SALPINGOOPHORECTOMY Bilateral 10/26/2014   Procedure: BILATERAL SALPINGO OOPHORECTOMY;  Surgeon: Molli Posey, MD;  Location: [facility];  Service: Gynecology;  Laterality: Bilateral;  . TRANSTHORACIC ECHOCARDIOGRAM  10-29-2012   mild lvh/  ef 60-65%/  grade II diastolic dysfunction/  trivial mr  &  tr    I have reviewed the social history and family history with the patient and they are unchanged from previous note.  ALLERGIES:  is allergic to lisinopril-hydrochlorothiazide; adhesive [tape]; fentanyl; hctz [hydrochlorothiazide]; latex; lisinopril; losartan potassium; and oxycodone.  MEDICATIONS:  Current Outpatient Medications  Medication Sig Dispense Refill  . albuterol (PROVENTIL HFA;VENTOLIN HFA) 108 (90 Base) MCG/ACT inhaler Inhale 2 puffs into the lungs every 6 (six) hours as needed for wheezing or shortness of breath.    Marland Kitchen amLODipine (NORVASC) 10 MG tablet Take 1 tablet (10 mg total) by mouth daily. 90 tablet 3  . BLACK CURRANT SEED OIL PO Take 15 mLs by mouth daily as needed (inflammation).    . Calcium Carbonate (CALCIUM 600 PO) Take 1 tablet by mouth daily.     . carvedilol (COREG) 6.25 MG tablet Take 1 tablet (6.25 mg total) by mouth 2 (two) times daily with a meal. 180 tablet 3  . Cholecalciferol (VITAMIN D3) 5000 units CAPS Take 1 capsule by mouth daily.     . diphenhydrAMINE (BENADRYL) 25 mg capsule Take 25 mg by mouth every 6 (six) hours as needed for allergies.    Marland Kitchen exemestane (AROMASIN) 25 MG tablet Take 1 tablet (25 mg total) by mouth daily  after breakfast. 30 tablet 2  . famotidine (PEPCID) 20 MG tablet One at bedtime (Patient taking differently: Take 20 mg by  mouth at bedtime. One at bedtime) 30 tablet 2  . fluticasone (FLOVENT HFA) 110 MCG/ACT inhaler Inhale 2 puffs into the lungs 2 (two) times daily. With spacer 1 Inhaler 5  . furosemide (LASIX) 20 MG tablet Take 1 tablet (20 mg total) by mouth daily. 90 tablet 3  . gabapentin (NEURONTIN) 100 MG capsule Take 2 capsules (200 mg total) by mouth at bedtime. 60 capsule 2  . hydrOXYzine (ATARAX/VISTARIL) 25 MG tablet Take 1 tablet (25 mg total) by mouth 2 (two) times daily. 40 tablet 0  . IBRANCE 75 MG capsule TAKE 1 CAPSULE (75MG) BY MOUTH DAILY. TAKE WHOLE WITH FOOD. 21 capsule 3  . letrozole (FEMARA) 2.5 MG tablet Take 1 tablet (2.5 mg total) by mouth daily. 90 tablet 4  . methocarbamol (ROBAXIN) 500 MG tablet Take 1 tablet (500 mg total) by mouth 4 (four) times daily. 30 tablet 0  . montelukast (SINGULAIR) 10 MG tablet Take 1 tablet (10 mg total) by mouth at bedtime. (Patient not taking: Reported on 01/02/2017) 30 tablet 5  . pantoprazole (PROTONIX) 40 MG tablet Take 1 tablet (40 mg total) by mouth daily. Take 30-60 min before first meal of the day 30 tablet 2  . promethazine (PHENERGAN) 25 MG tablet Take 1 tablet (25 mg total) by mouth every 6 (six) hours as needed for nausea or vomiting. 120 tablet 0  . ranitidine (ZANTAC) 300 MG tablet Take 1 tablet (300 mg total) by mouth at bedtime. 30 tablet 5  . traMADol (ULTRAM) 50 MG tablet Take 1 tablet (50 mg total) by mouth every 8 (eight) hours as needed. 30 tablet 0   No current facility-administered medications for this visit.     PHYSICAL EXAMINATION: ECOG PERFORMANCE STATUS: 2 - Symptomatic, <50% confined to bed  Vitals:   02/13/17 1344  BP: 138/68  Pulse: 86  Resp: 18  Temp: 97.9 F (36.6 C)  SpO2: 99%   Filed Weights   02/13/17 1344  Weight: 283 lb (128.4 kg)    GENERAL:alert, no distress and comfortable SKIN:  skin color, texture, turgor are normal, no rashes or significant lesions EYES: normal, Conjunctiva are pink and non-injected, sclera clear OROPHARYNX:no exudate, no erythema and lips, buccal mucosa, and tongue normal  NECK: supple, thyroid normal size, non-tender, (+)palpable right thyroid nodule  LYMPH:  no palpable lymphadenopathy in the cervical, axillary or inguinal LUNGS: clear to auscultation and percussion with normal breathing effort HEART: regular rate & rhythm and no murmurs and no lower extremity edema ABDOMEN:abdomen soft, non-tender and normal bowel sounds (+) significant tenderness in right lateral rib cage, mild tenderness in left lateral rib, abdominal exam negative.  Musculoskeletal:no cyanosis of digits and no clubbing  NEURO: alert & oriented x 3 with fluent speech, no focal motor/sensory deficits Breast: (+) S/p left mastectomy: She has mild tenderness around the tissue expander, no palpable mass or adenopathy. right breast exam negative    LABORATORY DATA:  I have reviewed the data as listed CBC Latest Ref Rng & Units 02/13/2017 12/28/2016 12/20/2016  WBC 3.9 - 10.3 10e3/uL 4.2 3.8(L) 5.3  Hemoglobin 11.6 - 15.9 g/dL 12.1 11.7 11.8  Hematocrit 34.8 - 46.6 % 36.1 35.0 35.0  Platelets 145 - 400 10e3/uL 273 253 252     CMP Latest Ref Rng & Units 02/13/2017 01/17/2017 12/28/2016  Glucose 70 - 140 mg/dl 99 94 138  BUN 7.0 - 26.0 mg/dL 10.5 12.7 7.9  Creatinine 0.6 - 1.1 mg/dL 0.9 0.8 0.8  Sodium 136 -  145 mEq/L 141 140 141  Potassium 3.5 - 5.1 mEq/L 4.0 4.2 3.6  Chloride 101 - 111 mmol/L - - -  CO2 22 - 29 mEq/L _0 Calcium 8.4 - 10.4 mg/dL 8.6 8.5 8.7  Total Protein 6.4 - 8.3 g/dL 7.4 7.1 7.2  Total Bilirubin 0.20 - 1.20 mg/dL 1.31(H) 0.82 1.43(H)  Alkaline Phos 40 - 150 U/L 87 89 88  AST 5 - 34 U/L _1 ALT 0 - 55 U/L _2 PROCEDURES  ECHO 01/22/17 Study Conclusions - Left ventricle: The cavity size was normal. There was moderate   concentric  hypertrophy. Systolic function was normal. The   estimated ejection fraction was in the range of 55% to 60%. Wall   motion was normal; there were no regional wall motion   abnormalities. Doppler parameters are consistent with abnormal   left ventricular relaxation (grade 1 diastolic dysfunction).   RADIOGRAPHIC STUDIES: I have personally reviewed the radiological images as listed and agreed with the findings in the report. No results found.    CT AP W Contrast 02/07/17 IMPRESSION: 1. No CT findings for abdominal/pelvic metastatic disease. 2. No acute abdominal findings, mass lesions or adenopathy. 3. Status post cholecystectomy with mild associated common bile duct dilatation. 4. Somewhat thickened appendix appears relatively stable. No acute inflammatory process. 5. Stable mixed lytic and sclerotic osseous metastatic disease.   Bone Scan Whole Body 02/07/17 IMPRESSION: No scintigraphic evidence skeletal metastasis    ASSESSMENT & PLAN:  Michaela Little is a 56 y.o. female with a history of HTN, HLD, Arthritis, Asthma, Sleep Apnea, and H. Pylori.   1. 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 -She has metastatic ER positive breast cancer to bones, has been on letrozole and Ibrance since March 2017. She has had her oncological care both in [place] and [place], her last PET scan was in December 2017 and last CT scan was in April 2018. Due to her worsening bilateral rib cage pain, I obtained restaging CT abdomen and pelvis with contrast, and bone scan on 02/07/2017. I reviewed the scan findings with pt, which showed stable lipomatosis, not hypermetabolic on bone scan, no other new lesions.  No evidence of disease progression on the restaging scan -Given her increase b/l rib cage pain, possible related to her letrozole along with her bone mets, I will switch her to Exemestane  (02/13/17).  Potential side effects reviewed with her again, she agrees to proceed. -Her last Xgeva was 01/17/17, will proceed with injection today and continue monthly  -Labs reviewed, CBC and CMP are WNL.  -f/u in 4 weeks with lab and injection     2. Primary breast cancer of left breast overlapping sites diagnosed March of 2010 and treated neo-adjuvantly at [facility] with (a) paclitaxel weekly x12 and (b) doxorubicin/ cyclophosphamide in dose dense fashion x4, both given with tifiparnib.  -definitive left modified radical mastectomy September of 2010 for a T2 N2 or stage IIIA invasive lobular breast cancer, estrogen and progesterone receptor positive, HER-2 negative,  -post mastectomy radiation completed January of 2011, -briefly on tamoxifen, discontinued it because of uterine lining concerns.  On anastrozole since May 2012 with good tolerance. Held between November and December 2013 due to GI issues, unrelated to cancer or its treatment. Discontinued by the patient summer of 2016  3. Bilateral rib cage pain, Left Breast pain -Pain  current 6-7/10 -She takes Tylenol and tramadol as needed -will restaging her to rule out progression of bone metastasis  -02/07/17 Bone scan showed no evidence of hypermetabolic bone metastasis.  -Bone pain has increased to bilateral ribs. She does not want to take narcotics and only takes tylenol as needed. I discussed switching her letrozole to Exemestane to lesson the effects on her bones and joint aches.  -I suggest if Tylenol is not controlling her pain she can use the tramadol that was prescribed to her along with heating pads.   4. Lower Extremity Edema -She is on Lasix, given by her PCP Dr. Altamease Oiler -I suggest compression socks and advices her to elevate her feet when sitting at home.  -Repeat echocardiogram in November 2018 shows normal EF  5. Dyspnea  -Uses Flovent -I will do a ECHO to rule out cardiomyopathy, she had chemo Adramycin before which can  cause heart failure  -01/22/17 ECHO showed normal EF 55-60%, and grade 1 diastolic dysfunction  6. Insomnia  -She is on hydroxyzine already  -I suggest her to try melatonin   7. Goal of care discussion  -We discussed the incurable nature of her cancer, and the overall poor prognosis, especially if she does not have good response to chemotherapy or progress on chemo -The patient understands the goal of care is palliative. -she is full code now  PLAN:  -Prescribed Exemestane today to replace letrozole.  -Continue Ibrance, plan to stop current cycle on 11/30 and start next cycle 12/8 -continue Xgeva today and monthly  -Lab, f/u and injection in 5-6 weeks weeks    No orders of the defined types were placed in this encounter.  All questions were answered. The patient knows to call the clinic with any problems, questions or concerns. No barriers to learning was detected.  I spent 30 minutes counseling the patient face to face. The total time spent in the appointment was 40 minutes and more than 50% was on counseling and review of test results     [name], MD 02/13/2017    This document serves as a record of services personally performed by [name], MD. It was created on her behalf by Joslyn Devon, a trained medical scribe. The creation of this record is based on the scribe's personal observations and the provider's statements to them.    I have reviewed the above documentation for accuracy and completeness, and I agree with the above.

## 2017-02-13 ENCOUNTER — Other Ambulatory Visit: Payer: Medicare Other

## 2017-02-13 ENCOUNTER — Ambulatory Visit: Payer: Medicare Other

## 2017-02-13 ENCOUNTER — Ambulatory Visit (HOSPITAL_BASED_OUTPATIENT_CLINIC_OR_DEPARTMENT_OTHER): Payer: Medicare Other | Admitting: Hematology

## 2017-02-13 ENCOUNTER — Other Ambulatory Visit (HOSPITAL_BASED_OUTPATIENT_CLINIC_OR_DEPARTMENT_OTHER): Payer: Medicare Other

## 2017-02-13 ENCOUNTER — Ambulatory Visit: Payer: Medicare Other | Admitting: Hematology

## 2017-02-13 ENCOUNTER — Ambulatory Visit (HOSPITAL_BASED_OUTPATIENT_CLINIC_OR_DEPARTMENT_OTHER): Payer: Medicare Other

## 2017-02-13 ENCOUNTER — Telehealth: Payer: Self-pay

## 2017-02-13 VITALS — BP 138/68 | HR 86 | Temp 97.9°F | Resp 18 | Ht 67.0 in | Wt 283.0 lb

## 2017-02-13 DIAGNOSIS — R609 Edema, unspecified: Secondary | ICD-10-CM

## 2017-02-13 DIAGNOSIS — C50812 Malignant neoplasm of overlapping sites of left female breast: Secondary | ICD-10-CM

## 2017-02-13 DIAGNOSIS — E079 Disorder of thyroid, unspecified: Secondary | ICD-10-CM

## 2017-02-13 DIAGNOSIS — Z7189 Other specified counseling: Secondary | ICD-10-CM | POA: Diagnosis not present

## 2017-02-13 DIAGNOSIS — C50912 Malignant neoplasm of unspecified site of left female breast: Secondary | ICD-10-CM | POA: Diagnosis not present

## 2017-02-13 DIAGNOSIS — C7951 Secondary malignant neoplasm of bone: Secondary | ICD-10-CM

## 2017-02-13 DIAGNOSIS — R1310 Dysphagia, unspecified: Secondary | ICD-10-CM

## 2017-02-13 DIAGNOSIS — Z170 Estrogen receptor positive status [ER+]: Secondary | ICD-10-CM

## 2017-02-13 DIAGNOSIS — R0609 Other forms of dyspnea: Secondary | ICD-10-CM | POA: Diagnosis not present

## 2017-02-13 DIAGNOSIS — R070 Pain in throat: Secondary | ICD-10-CM | POA: Diagnosis not present

## 2017-02-13 DIAGNOSIS — R0781 Pleurodynia: Secondary | ICD-10-CM | POA: Diagnosis not present

## 2017-02-13 LAB — COMPREHENSIVE METABOLIC PANEL
ALBUMIN: 3.7 g/dL (ref 3.5–5.0)
ALK PHOS: 87 U/L (ref 40–150)
ALT: 10 U/L (ref 0–55)
AST: 14 U/L (ref 5–34)
Anion Gap: 8 mEq/L (ref 3–11)
BILIRUBIN TOTAL: 1.31 mg/dL — AB (ref 0.20–1.20)
BUN: 10.5 mg/dL (ref 7.0–26.0)
CALCIUM: 8.6 mg/dL (ref 8.4–10.4)
CO2: 26 mEq/L (ref 22–29)
Chloride: 108 mEq/L (ref 98–109)
Creatinine: 0.9 mg/dL (ref 0.6–1.1)
GLUCOSE: 99 mg/dL (ref 70–140)
Potassium: 4 mEq/L (ref 3.5–5.1)
Sodium: 141 mEq/L (ref 136–145)
TOTAL PROTEIN: 7.4 g/dL (ref 6.4–8.3)

## 2017-02-13 LAB — CBC WITH DIFFERENTIAL/PLATELET
BASO%: 0.5 % (ref 0.0–2.0)
Basophils Absolute: 0 10e3/uL (ref 0.0–0.1)
EOS%: 3.8 % (ref 0.0–7.0)
Eosinophils Absolute: 0.2 10e3/uL (ref 0.0–0.5)
HCT: 36.1 % (ref 34.8–46.6)
HGB: 12.1 g/dL (ref 11.6–15.9)
LYMPH%: 26.9 % (ref 14.0–49.7)
MCH: 30.2 pg (ref 25.1–34.0)
MCHC: 33.5 g/dL (ref 31.5–36.0)
MCV: 90 fL (ref 79.5–101.0)
MONO#: 0.3 10e3/uL (ref 0.1–0.9)
MONO%: 6.1 % (ref 0.0–14.0)
NEUT#: 2.7 10e3/uL (ref 1.5–6.5)
NEUT%: 62.7 % (ref 38.4–76.8)
Platelets: 273 10e3/uL (ref 145–400)
RBC: 4.01 10e6/uL (ref 3.70–5.45)
RDW: 14.5 % (ref 11.2–14.5)
WBC: 4.2 10e3/uL (ref 3.9–10.3)
lymph#: 1.1 10e3/uL (ref 0.9–3.3)
nRBC: 0 % (ref 0–0)

## 2017-02-13 MED ORDER — EXEMESTANE 25 MG PO TABS: 25.0000 mg | ORAL_TABLET | Freq: Every day | ORAL | 2 refills | Status: DC

## 2017-02-13 MED ORDER — DENOSUMAB 120 MG/1.7ML ~~LOC~~ SOLN
120.0000 mg | Freq: Once | SUBCUTANEOUS | Status: AC
  Administered 2017-02-13: 120 mg via SUBCUTANEOUS
  Filled 2017-02-13: qty 1.7

## 2017-02-13 NOTE — Telephone Encounter (Signed)
Printed avs and calender for upcoming appointment.per 11/21 los

## 2017-02-13 NOTE — Patient Instructions (Signed)
Denosumab injection  What is this medicine?  DENOSUMAB (den oh sue mab) slows bone breakdown. Prolia is used to treat osteoporosis in women after menopause and in men. Xgeva is used to prevent bone fractures and other bone problems caused by cancer bone metastases. Xgeva is also used to treat giant cell tumor of the bone.  This medicine may be used for other purposes; ask your health care provider or pharmacist if you have questions.  What should I tell my health care provider before I take this medicine?  They need to know if you have any of these conditions:  -dental disease  -eczema  -infection or history of infections  -kidney disease or on dialysis  -low blood calcium or vitamin D  -malabsorption syndrome  -scheduled to have surgery or tooth extraction  -taking medicine that contains denosumab  -thyroid or parathyroid disease  -an unusual reaction to denosumab, other medicines, foods, dyes, or preservatives  -pregnant or trying to get pregnant  -breast-feeding  How should I use this medicine?  This medicine is for injection under the skin. It is given by a health care professional in a hospital or clinic setting.  If you are getting Prolia, a special MedGuide will be given to you by the pharmacist with each prescription and refill. Be sure to read this information carefully each time.  For Prolia, talk to your pediatrician regarding the use of this medicine in children. Special care may be needed. For Xgeva, talk to your pediatrician regarding the use of this medicine in children. While this drug may be prescribed for children as young as 13 years for selected conditions, precautions do apply.  Overdosage: If you think you have taken too much of this medicine contact a poison control center or emergency room at once.  NOTE: This medicine is only for you. Do not share this medicine with others.  What if I miss a dose?  It is important not to miss your dose. Call your doctor or health care professional if you are  unable to keep an appointment.  What may interact with this medicine?  Do not take this medicine with any of the following medications:  -other medicines containing denosumab  This medicine may also interact with the following medications:  -medicines that suppress the immune system  -medicines that treat cancer  -steroid medicines like prednisone or cortisone  This list may not describe all possible interactions. Give your health care provider a list of all the medicines, herbs, non-prescription drugs, or dietary supplements you use. Also tell them if you smoke, drink alcohol, or use illegal drugs. Some items may interact with your medicine.  What should I watch for while using this medicine?  Visit your doctor or health care professional for regular checks on your progress. Your doctor or health care professional may order blood tests and other tests to see how you are doing.  Call your doctor or health care professional if you get a cold or other infection while receiving this medicine. Do not treat yourself. This medicine may decrease your body's ability to fight infection.  You should make sure you get enough calcium and vitamin D while you are taking this medicine, unless your doctor tells you not to. Discuss the foods you eat and the vitamins you take with your health care professional.  See your dentist regularly. Brush and floss your teeth as directed. Before you have any dental work done, tell your dentist you are receiving this medicine.  Do   not become pregnant while taking this medicine or for 5 months after stopping it. Women should inform their doctor if they wish to become pregnant or think they might be pregnant. There is a potential for serious side effects to an unborn child. Talk to your health care professional or pharmacist for more information.  What side effects may I notice from receiving this medicine?  Side effects that you should report to your doctor or health care professional as soon as  possible:  -allergic reactions like skin rash, itching or hives, swelling of the face, lips, or tongue  -breathing problems  -chest pain  -fast, irregular heartbeat  -feeling faint or lightheaded, falls  -fever, chills, or any other sign of infection  -muscle spasms, tightening, or twitches  -numbness or tingling  -skin blisters or bumps, or is dry, peels, or red  -slow healing or unexplained pain in the mouth or jaw  -unusual bleeding or bruising  Side effects that usually do not require medical attention (Report these to your doctor or health care professional if they continue or are bothersome.):  -muscle pain  -stomach upset, gas  This list may not describe all possible side effects. Call your doctor for medical advice about side effects. You may report side effects to FDA at 1-800-FDA-1088.  Where should I keep my medicine?  This medicine is only given in a clinic, doctor's office, or other health care setting and will not be stored at home.  NOTE: This sheet is a summary. It may not cover all possible information. If you have questions about this medicine, talk to your doctor, pharmacist, or health care provider.      2016, [employer]vier/Gold Standard. (2011-09-10 12:37:47)

## 2017-02-14 ENCOUNTER — Encounter: Payer: Self-pay | Admitting: Hematology

## 2017-02-14 LAB — CANCER ANTIGEN 27.29: CA 27.29: 15.2 U/mL (ref 0.0–38.6)

## 2017-02-15 ENCOUNTER — Other Ambulatory Visit (INDEPENDENT_AMBULATORY_CARE_PROVIDER_SITE_OTHER): Payer: Self-pay | Admitting: Physician Assistant

## 2017-02-15 DIAGNOSIS — L299 Pruritus, unspecified: Secondary | ICD-10-CM

## 2017-02-18 NOTE — Telephone Encounter (Signed)
FWD to PCP. Tempestt S Roberts, CMA  

## 2017-02-19 ENCOUNTER — Telehealth (INDEPENDENT_AMBULATORY_CARE_PROVIDER_SITE_OTHER): Payer: Self-pay | Admitting: Physician Assistant

## 2017-02-19 ENCOUNTER — Telehealth: Payer: Self-pay

## 2017-02-19 ENCOUNTER — Telehealth: Payer: Self-pay | Admitting: Hematology

## 2017-02-19 ENCOUNTER — Other Ambulatory Visit: Payer: Self-pay | Admitting: Internal Medicine

## 2017-02-19 DIAGNOSIS — R0609 Other forms of dyspnea: Principal | ICD-10-CM

## 2017-02-19 DIAGNOSIS — C50919 Malignant neoplasm of unspecified site of unspecified female breast: Secondary | ICD-10-CM

## 2017-02-19 MED ORDER — PROMETHAZINE HCL 25 MG PO TABS
25.0000 mg | ORAL_TABLET | Freq: Four times a day (QID) | ORAL | 0 refills | Status: DC | PRN
Start: 2017-02-19 — End: 2017-06-03

## 2017-02-19 NOTE — Telephone Encounter (Signed)
I will try to see her at [facility] with Lucianne Lei then.   [name] MD

## 2017-02-19 NOTE — Telephone Encounter (Signed)
Patient calling requesting to talk to  PA Altamease Oiler she is been having vomiting and severe abdominal pain she said that her ct show no inflammation in the appendix . Please, call her thank you

## 2017-02-19 NOTE — Telephone Encounter (Signed)
Pt called that she went over bone scan and CT with Dr Burr Medico and she never mentioned her breast expander had ruptured.  Also she has been vomiting since yesterday.  Called pt back    - She has a history of vomiting. She has vomiting since the bone scan and CT on 11/15.  She also has diarrhea, soft to watery. Her skin is dry and tenting, her lips are dry. She is drinking but not enough. With discussion it was found she was taking protonix and thinking it was her nausea med. She has no nausea med in the house.    - She is aware that she has thickening of the appendix. She is wanting to know if there is something growing on it and she is worried that it will burst. Does she need to get it out now? Why wait until acute problem occur?   - She is asking if she needs the expander removed. It has been present for many years.   She is asking to see [facility], (tomorrow d/t transportation issues), for the vomiting and diarrhea.  S/w Lucianne Lei and reordered phenergan (last ordered 01/17/16). inbasket sent for labs/[facility] tomorrow mid morning. Cbc, cmet, magnesuim ordered.

## 2017-02-19 NOTE — Telephone Encounter (Signed)
Scheduled injection in addition to appts on 1/4 per 11/22 sch msg. Sending confirmation letter in the mail regarding the added appt.

## 2017-02-19 NOTE — Telephone Encounter (Signed)
Spoke to patient regarding upcoming November appointments per 11/27 sch message.

## 2017-02-19 NOTE — Telephone Encounter (Signed)
Returned patients cal, her oncologist has given her something for the nausea. Patient wanted PCP to be aware of recent imaging she had done, and see if he had any concerns about her appendix. Patient informed that PCP is notified via EMR system but CMA would verbally tell him as well and if he has any concerns someone will contact her by phone. Nat Christen, CMA

## 2017-02-20 ENCOUNTER — Other Ambulatory Visit (HOSPITAL_BASED_OUTPATIENT_CLINIC_OR_DEPARTMENT_OTHER): Payer: Medicare Other

## 2017-02-20 ENCOUNTER — Ambulatory Visit (HOSPITAL_BASED_OUTPATIENT_CLINIC_OR_DEPARTMENT_OTHER): Payer: Medicare Other | Admitting: Medical

## 2017-02-20 VITALS — BP 136/48 | HR 67 | Temp 97.0°F | Resp 18 | Ht >= 80 in | Wt 283.2 lb

## 2017-02-20 DIAGNOSIS — R1031 Right lower quadrant pain: Secondary | ICD-10-CM | POA: Diagnosis not present

## 2017-02-20 DIAGNOSIS — C50812 Malignant neoplasm of overlapping sites of left female breast: Secondary | ICD-10-CM

## 2017-02-20 DIAGNOSIS — C50912 Malignant neoplasm of unspecified site of left female breast: Secondary | ICD-10-CM

## 2017-02-20 DIAGNOSIS — C7951 Secondary malignant neoplasm of bone: Secondary | ICD-10-CM

## 2017-02-20 DIAGNOSIS — Z170 Estrogen receptor positive status [ER+]: Secondary | ICD-10-CM

## 2017-02-20 DIAGNOSIS — C50919 Malignant neoplasm of unspecified site of unspecified female breast: Secondary | ICD-10-CM

## 2017-02-20 DIAGNOSIS — R110 Nausea: Secondary | ICD-10-CM | POA: Diagnosis not present

## 2017-02-20 LAB — CBC WITH DIFFERENTIAL/PLATELET
BASO%: 0.8 % (ref 0.0–2.0)
Basophils Absolute: 0 10*3/uL (ref 0.0–0.1)
EOS%: 4.7 % (ref 0.0–7.0)
Eosinophils Absolute: 0.1 10*3/uL (ref 0.0–0.5)
HEMATOCRIT: 36 % (ref 34.8–46.6)
HEMOGLOBIN: 11.9 g/dL (ref 11.6–15.9)
LYMPH#: 0.8 10*3/uL — AB (ref 0.9–3.3)
LYMPH%: 28.3 % (ref 14.0–49.7)
MCH: 29.9 pg (ref 25.1–34.0)
MCHC: 33.1 g/dL (ref 31.5–36.0)
MCV: 90.1 fL (ref 79.5–101.0)
MONO#: 0.2 10*3/uL (ref 0.1–0.9)
MONO%: 8.2 % (ref 0.0–14.0)
NEUT%: 58 % (ref 38.4–76.8)
NEUTROS ABS: 1.7 10*3/uL (ref 1.5–6.5)
PLATELETS: 229 10*3/uL (ref 145–400)
RBC: 3.99 10*6/uL (ref 3.70–5.45)
RDW: 14.8 % — AB (ref 11.2–14.5)
WBC: 2.9 10*3/uL — ABNORMAL LOW (ref 3.9–10.3)

## 2017-02-20 LAB — COMPREHENSIVE METABOLIC PANEL
ALBUMIN: 3.8 g/dL (ref 3.5–5.0)
ALK PHOS: 86 U/L (ref 40–150)
ALT: 8 U/L (ref 0–55)
ANION GAP: 7 meq/L (ref 3–11)
AST: 14 U/L (ref 5–34)
BUN: 11.4 mg/dL (ref 7.0–26.0)
CO2: 27 mEq/L (ref 22–29)
Calcium: 9 mg/dL (ref 8.4–10.4)
Chloride: 108 mEq/L (ref 98–109)
Creatinine: 0.8 mg/dL (ref 0.6–1.1)
GLUCOSE: 98 mg/dL (ref 70–140)
POTASSIUM: 4 meq/L (ref 3.5–5.1)
SODIUM: 142 meq/L (ref 136–145)
Total Bilirubin: 1.66 mg/dL — ABNORMAL HIGH (ref 0.20–1.20)
Total Protein: 7.5 g/dL (ref 6.4–8.3)

## 2017-02-20 LAB — MAGNESIUM: MAGNESIUM: 2.4 mg/dL (ref 1.5–2.5)

## 2017-02-20 NOTE — Progress Notes (Signed)
Appt made for patient to be seen @ [facility] on 02/26/17 @ 3:20 pm for both left breast expander rupture and issues related to her appendix.  Dr. Rush Farmer will be seeing her that day.  TCT patient and informed her of the above.  She voiced understanding-address and phone # of [facility] given to pt as well as date and time of appt.

## 2017-02-20 NOTE — Progress Notes (Signed)
[facility] Progress Note   Michaela Little [id number] [birth year] 56 y.o.  Michaela Little is managed by Dr. Truitt Little  Actively treated with chemotherapy: yes  Current Therapy: Exemestane, Michaela Little, and Xgeva  Last Treated: 11 / 28 / 2018  Assessment: Plan:    Malignant neoplasm of overlapping sites of left breast in female, estrogen receptor positive ([place]) - Plan: Ambulatory referral to Plastic Surgery  Right lower quadrant abdominal pain - Plan: Ambulatory referral to General Surgery  Please see After Visit Summary for patient specific instructions.  Future Appointments  Date Time Provider [place]  03/29/2017 11:15 AM [facility] LAB 3 [facility] None  03/29/2017 11:45 AM Michaela Merle, MD [facility] None  03/29/2017 12:00 PM [facility] FLUSH NURSE [facility] None  04/24/2017 10:45 AM Kozlow, Michaela Poag, MD [facility] None    Orders Placed This Encounter  Procedures  . Ambulatory referral to Plastic Surgery  . Ambulatory referral to General Surgery       Subjective:   Patient ID:  Michaela Little is a 56 y.o. (DOB [birth year]) female.  Chief Complaint: No chief complaint on file.   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  The patient was originally diagnosed in 2010 and treated neo-adjuvantly in [place].  She was recently seen by Michaela Little when the patient transition her care from Michaela Little.  She had been treated most recently with letrozole and Ibrance since March 2017.  She was having worsening bilateral rib cage pain.  A CT scan of the abdomen and pelvis was completed along with a bone scan on 02/07/2017.  The patient scan showed stable lipomatosis, not hypermetabolic on the bone scan.  There were no other lesions noted.  There was no evidence of disease progression.  Michaela Little transitioned the patient from letrozole to exemestane due to  the patient's bilateral rib pain.  The patient only received that medication yesterday.  Until that time she had continued on letrozole.  At the time of the patient's last CT scan it was noted that her appendix was somewhat thickened but stable.  There is no fluid surrounding the appendix or mucosal enhancement.  There was no evidence of inflammation.  It was believed that this could represent chronic granulomatous inflammation.  No appendicolith were noted.  The patient has long-standing right lower quadrant abdominal pain and would like to be seen by a surgeon.  She has also had vomiting and diarrhea since her CT scans of 02/07/2017.  She thought that she was taking nausea medications but realized that she was taking Protonix, Zantac and/or Pepcid.  A prescription for Phenergan was sent to her pharmacy yesterday.  She has not yet picked this up.  Patient also has a history of left mastectomy.  She had a breast expander placed which was supposed to be changed to a permanent implant.  This was never replaced with a permanent implant.    Medications: I have reviewed the patient's current medications.  Allergies:  Allergies  Allergen Reactions  . Lisinopril-Hydrochlorothiazide Itching  . Adhesive [Tape] Itching and Other (See Comments)    Redness  . Fentanyl     GI upset and drowsiness  . Hctz [Hydrochlorothiazide] Itching  . Latex Itching  . Lisinopril Itching  . Losartan Potassium Other (See Comments)    Makes her feel "bad"   . Oxycodone  GI upset, drowsy    Past Medical History:  Diagnosis Date  . Anxiety   . Arthritis    hands, knees, hips  . Asthma   . Cancer ([place])   . Cervical stenosis (uterine cervix)   . Dyspnea on exertion   . GERD (gastroesophageal reflux disease)   . Headache(784.0)   . History of breast cancer    2010--  LEFT  s/p  mastectomy (in [place]) AND CHEMORADIATION--  NO RECURRENCE  . History of cervical dysplasia   . History of TB skin testing    AS [place]EN--  [place]  W/ MEDS  . Hyperlipidemia   . Hypertension   . Malignant neoplasm of overlapping sites of left breast in female, estrogen receptor positive ([place]) 03/05/2013  . OSA (obstructive sleep apnea) moderate osa per study  09/2010   CPAP  , NOT USING ON REGULAR BASIS  . Pelvic pain in female   . Positive H. pylori test    08-05-2013  . Refusal of blood transfusions as patient is Jehovah's Witness   . Seasonal allergies   . Uterine fibroid   . Wears glasses     Past Surgical History:  Procedure Laterality Date  . CARDIOVASCULAR STRESS TEST  10-29-2012   low risk perfusion study/  no significant reversibity/ ef 66%/  normal wall motion  . CERVICAL CONIZATION W/BX  2012   in  [place] N/A 05/14/2012   Procedure: LAPAROSCOPIC CHOLECYSTECTOMY;  Surgeon: Michaela Ok, MD;  Location: [facility];  Service: General;  Laterality: N/A;  . COLONOSCOPY  2011   normal per patient - [place]  . DILATION AND CURETTAGE OF UTERUS  10/15/2011   Procedure: DILATATION AND CURETTAGE;  Surgeon: Michaela Schools, MD;  Location: [facility];  Service: Gynecology;  Laterality: N/A;  Conization &  endocervical curettings  . EXAMINATION UNDER ANESTHESIA N/A 08/20/2013   Procedure: EXAM UNDER ANESTHESIA;  Surgeon: Michaela Asal, MD;  Location: [facility];  Service: Gynecology;  Laterality: N/A;  . [place]   right  . LAPAROSCOPIC ASSISTED VAGINAL HYSTERECTOMY N/A 10/26/2014   Procedure: HYSTERECTOMY ABDOMINAL ;  Surgeon: Michaela Posey, MD;  Location: [facility];  Service: Gynecology;  Laterality: N/A;  . MASTECTOMY Left 11/2008  in [place]   . SALPINGOOPHORECTOMY Bilateral 10/26/2014   Procedure: BILATERAL SALPINGO OOPHORECTOMY;  Surgeon: Michaela Posey, MD;  Location: [facility];  Service: Gynecology;  Laterality: Bilateral;  . TRANSTHORACIC ECHOCARDIOGRAM  10-29-2012   mild lvh/  ef 60-65%/  grade II diastolic dysfunction/  trivial mr  &  tr    Family History  Problem Relation  Age of Onset  . Breast cancer Mother   . Colon cancer Mother   . Hypotension Mother   . Asthma Mother   . Diabetes type II Mother   . Arthritis Mother   . Clotting disorder Mother   . Cancer Mother        breast/colon  . Mental illness Brother   . Heart disease Brother   . Cerebral palsy Daughter   . Emphysema Brother        never smoker  . Colon cancer Maternal Aunt   . Cancer Maternal Aunt        colon  . Colon cancer Maternal Uncle   . Cancer Maternal Uncle        colon  . Esophageal cancer Neg Hx   . Rectal cancer Neg Hx   .  Stomach cancer Neg Hx   . Thyroid disease Neg Hx     Social History   Socioeconomic History  . Marital status: Single    Spouse name: Not on file  . Number of children: 5  . Years of education: Not on file  . Highest education level: Not on file  Social Needs  . Financial resource strain: Not on file  . Food insecurity - worry: Not on file  . Food insecurity - inability: Not on file  . Transportation needs - medical: Not on file  . Transportation needs - non-medical: Not on file  Occupational History  . Occupation: Disabled  Tobacco Use  . Smoking status: Former Smoker    Packs/day: 0.30    Years: 10.00    Pack years: 3.00    Types: Cigarettes    Last attempt to quit: 03/26/1988    Years since quitting: 28.9  . Smokeless tobacco: Never Used  Substance and Sexual Activity  . Alcohol use: No  . Drug use: No  . Sexual activity: Not Currently  Other Topics Concern  . Not on file  Social History Narrative  . Not on file    Past Medical History, Surgical history, Social history, and Family history were reviewed and updated as appropriate.   Please see review of systems for further details on the patient's review from today.   Review of Systems:  Review of Systems  Constitutional: Positive for appetite change. Negative for chills, diaphoresis, fever and unexpected weight change.  HENT: Negative for trouble swallowing.     Respiratory: Negative for cough, chest tightness and shortness of breath.   Cardiovascular: Negative for chest pain and palpitations.  Gastrointestinal: Positive for abdominal pain, diarrhea, nausea and vomiting.    Objective:   Physical Exam:  BP (!) 136/48 (BP Location: Left Arm, Patient Position: Sitting) Comment: nurse aware  Pulse 67   Temp (!) 97 F (36.1 C) (Oral)   Resp 18   Ht (!) 9' 7" (2.921 m)   Wt 283 lb 3.2 oz (128.5 kg)   LMP 02/25/2009   SpO2 100%   BMI 15.06 kg/m  ECOG: 1  Physical Exam  Constitutional: No distress.  HENT:  Head: Normocephalic and atraumatic.  Cardiovascular: Normal rate, regular rhythm and normal heart sounds. Exam reveals no gallop and no friction rub.  No murmur heard. Pulmonary/Chest: Effort normal and breath sounds normal. No respiratory distress. She has no wheezes. She has no rales.  Abdominal: Soft. Bowel sounds are normal. She exhibits no distension. There is tenderness. There is no rebound and no guarding.    Neurological: She is alert. Coordination (The patient is ambulating with the use of a rolling walker.) abnormal.  Skin: Skin is warm and dry. She is not diaphoretic.    Lab Review:     Component Value Date/Time   NA 142 02/20/2017 1111   K 4.0 02/20/2017 1111   CL 103 12/12/2016 1220   CL 105 07/07/2012 1327   CO2 27 02/20/2017 1111   GLUCOSE 98 02/20/2017 1111   GLUCOSE 104 (H) 07/07/2012 1327   BUN 11.4 02/20/2017 1111   CREATININE 0.8 02/20/2017 1111   CALCIUM 9.0 02/20/2017 1111   PROT 7.5 02/20/2017 1111   ALBUMIN 3.8 02/20/2017 1111   AST 14 02/20/2017 1111   ALT 8 02/20/2017 1111   ALKPHOS 86 02/20/2017 1111   BILITOT 1.66 (H) 02/20/2017 1111   GFRNONAA >60 12/12/2016 1220   GFRAA >60 12/12/2016 1220  Component Value Date/Time   WBC 2.9 (L) 02/20/2017 1111   WBC 2.9 (L) 12/12/2016 1220   RBC 3.99 02/20/2017 1111   RBC 4.07 12/12/2016 1220   HGB 11.9 02/20/2017 1111   HCT 36.0 02/20/2017  1111   PLT 229 02/20/2017 1111   PLT 231 12/03/2016 1127   MCV 90.1 02/20/2017 1111   MCH 29.9 02/20/2017 1111   MCH 30.2 12/12/2016 1220   MCHC 33.1 02/20/2017 1111   MCHC 34.1 12/12/2016 1220   RDW 14.8 (H) 02/20/2017 1111   LYMPHSABS 0.8 (L) 02/20/2017 1111   MONOABS 0.2 02/20/2017 1111   EOSABS 0.1 02/20/2017 1111   EOSABS 0.2 12/03/2016 1127   BASOSABS 0.0 02/20/2017 1111   -------------------------------  Imaging from last 24 hours (if applicable):  Radiology interpretation: Nm Bone Scan Whole Body  Result Date: 02/07/2017 CLINICAL DATA:  Breast cancer with bone metastasis. Bilateral rib pain EXAM: NUCLEAR MEDICINE WHOLE BODY BONE SCAN TECHNIQUE: Whole body anterior and posterior images were obtained approximately 3 hours after intravenous injection of radiopharmaceutical. RADIOPHARMACEUTICALS:  20.7 mCi Technetium-65mMDP IV COMPARISON:  PET-CT scan 03/01/2016, bone scan 11/07/2015, chest CT 02/07/2017 FINDINGS: Focal uptake at the costovertebral junction at the RIGHT seventh rib is favored degenerative. Degenerate uptake noted the LEFT acromioclavicular joint additionally. Symmetric uptake in the calvarium similar prior.No focal uptake in the axillary or appendicular skeleton suggest metastasis. IMPRESSION: No scintigraphic evidence skeletal metastasis . Electronically Signed   By: SSuzy BouchardM.D.   On: 02/07/2017 20:19   Ct Abdomen Pelvis W Contrast  Result Date: 02/07/2017 CLINICAL DATA:  History of breast cancer and metastatic bone disease. Ongoing chemotherapy. Right-sided abdominal pain for 1 year, worsening over the past 2 months. EXAM: CT ABDOMEN AND PELVIS WITH CONTRAST TECHNIQUE: Multidetector CT imaging of the abdomen and pelvis was performed using the standard protocol following bolus administration of intravenous contrast. CONTRAST:  100 cc Isovue-300 COMPARISON:  The CT scan 07/03/2016 FINDINGS: Lower chest: The lung bases are clear. No worrisome pulmonary  nodules to suggest pulmonary metastatic disease. The heart is normal in size. No pericardial effusion. The distal esophagus is grossly normal. Hepatobiliary: No focal hepatic lesions or intrahepatic biliary dilatation. The gallbladder is surgically absent. Mild associated common bile duct dilatation is stable. Pancreas: No mass, inflammation or ductal dilatation. Spleen: Normal size.  No focal lesions. Adrenals/Urinary Tract: The adrenal glands and kidneys are unremarkable. Small lower pole right renal cyst is stable. No renal, ureteral or bladder calculi or mass. Stomach/Bowel: The stomach, duodenum, small bowel and colon are unremarkable. No acute inflammatory process, mass lesions or obstructive findings. The terminal ileum is normal. The appendix is mildly diffusely thickened and best seen on the coronal sequence. Maximum diameter is close to 10 mm but the appendix shows no signs of acute inflammation. The there is no fluid in the wound min or mucosal enhancement. No surrounding inflammation. It appears very similar on the prior CT scans and this may be chronic granulomatous inflammation. No definite appendicolith although I do see one on prior studies. Vascular/Lymphatic: The aorta is normal in caliber. No dissection. The branch vessels are patent. The major venous structures are patent. No mesenteric or retroperitoneal mass or adenopathy. Small scattered lymph nodes are noted. Reproductive: Surgically absent. Other: No pelvic mass or adenopathy. No free pelvic fluid collections. No inguinal mass or adenopathy. No abdominal wall hernia or subcutaneous lesions. Musculoskeletal: Mixed lytic and sclerotic appearance of the bony structures the which appears stable and is consistent with  known metastatic disease. No pathologic fracture is identified. No spinal canal compromise. Stable left breast prosthesis with membrane rupture. IMPRESSION: 1. No CT findings for abdominal/pelvic metastatic disease. 2. No acute  abdominal findings, mass lesions or adenopathy. 3. Status post cholecystectomy with mild associated common bile duct dilatation. 4. Somewhat thickened appendix appears relatively stable. No acute inflammatory process. 5. Stable mixed lytic and sclerotic osseous metastatic disease. Electronically Signed   By: Marijo Sanes M.D.   On: 02/07/2017 15:58        This case was discussed with Michaela Little. She expressed agreement with my management of this patient.

## 2017-02-21 LAB — CANCER ANTIGEN 27.29: CA 27.29: 20.6 U/mL (ref 0.0–38.6)

## 2017-02-26 ENCOUNTER — Telehealth (INDEPENDENT_AMBULATORY_CARE_PROVIDER_SITE_OTHER): Payer: Self-pay | Admitting: Physician Assistant

## 2017-02-26 ENCOUNTER — Other Ambulatory Visit: Payer: Self-pay | Admitting: Surgery

## 2017-02-26 DIAGNOSIS — R109 Unspecified abdominal pain: Secondary | ICD-10-CM | POA: Diagnosis not present

## 2017-02-26 NOTE — Telephone Encounter (Signed)
FWD to PCP. Tempestt S Roberts, CMA  

## 2017-02-26 NOTE — Telephone Encounter (Signed)
Michaela Little called regarding a recall of Alodipine that increase cancer and she is a survivor . Please, call her at [phone]   Thank you

## 2017-03-06 NOTE — Telephone Encounter (Signed)
I have addressed this in another message thread.

## 2017-03-21 ENCOUNTER — Other Ambulatory Visit: Payer: Self-pay | Admitting: Medical

## 2017-03-21 ENCOUNTER — Other Ambulatory Visit (INDEPENDENT_AMBULATORY_CARE_PROVIDER_SITE_OTHER): Payer: Self-pay | Admitting: Physician Assistant

## 2017-03-21 DIAGNOSIS — R609 Edema, unspecified: Secondary | ICD-10-CM

## 2017-03-21 DIAGNOSIS — M5414 Radiculopathy, thoracic region: Secondary | ICD-10-CM

## 2017-03-21 NOTE — Telephone Encounter (Signed)
FWD to PCP. Michaela Little, CMA  

## 2017-03-22 DIAGNOSIS — T8549XA Other mechanical complication of breast prosthesis and implant, initial encounter: Secondary | ICD-10-CM | POA: Diagnosis not present

## 2017-03-22 DIAGNOSIS — C50912 Malignant neoplasm of unspecified site of left female breast: Secondary | ICD-10-CM | POA: Diagnosis not present

## 2017-03-22 DIAGNOSIS — C7951 Secondary malignant neoplasm of bone: Secondary | ICD-10-CM | POA: Diagnosis not present

## 2017-03-28 NOTE — Progress Notes (Signed)
[place]  [phone] [phone]) [phone]  Clinic Follow up Note   Patient Care Team: [name] as PCP - General (Physician Assistant) [name], MD as Consulting Physician (Pulmonary Disease) [name], MD as Consulting Physician (Hematology)   Date of Service:  03/29/2017   CHIEF COMPLAIN: F/U metastatic breast cancer   SUMMARY OF ONCOLOGIC HISTORY: Oncology History   Cancer Staging Malignant neoplasm of overlapping sites of left breast in female, estrogen receptor positive ([place]) Staging form: Breast, AJCC 7th Edition - Clinical: Stage IIIA (T2, N2, M0) - Unsigned       Malignant neoplasm of overlapping sites of left breast in female, estrogen receptor positive ([place])   05/2008 Cancer Diagnosis    Left sided breast cancer (no path report available)      11/2008 Pathologic Stage    Stage IIIA: T2 N2       Neo-Adjuvant Chemotherapy    Paclitaxel weekly x 12; doxorubicin and cyclophosphamide x 4 - both given with trifiparnib (treated at [facility] in [place])      11/2008 Definitive Surgery    Left modified radiation mastectomy: invasive lobular carcinoma, ER+, PR+, HER2/neu negative, 5/22 LN positive       - 03/2009 Radiation Therapy    Adjuvant radiation to left chest wall      2011 - 08/2014 Anti-estrogen oral therapy    Tamoxifen 20 mg used briefly; discontinued due to uterine lining concerns; changed to anastrozole 1 mg daily (began 07/2100); discontinued by patient summer of 2016      05/26/2015 Progression    Iliac bone biopsy showed metastatic carcinoma consistent with a breast primary, ER positive, PR and HER-2 negative. Her staging CT, and a PET scan was negative for visceral metastasis.      05/2015 -  Anti-estrogen oral therapy    1. letrozole and Ibrance started March 2017             -Ibrance dose decreased to 75 mg daily  -Letrozole switched to Exemestane on 02/13/17 due to b/l rib pain.       11/16/2015  Miscellaneous    Xgeva monthly for bone metastasis       07/03/2016 Imaging    CT chest, abdomen and pelvis with contrast showed no evidence of metastasis.       02/07/2017 Imaging    CT AP W Contrast 02/07/17 IMPRESSION: 1. No CT findings for abdominal/pelvic metastatic disease. 2. No acute abdominal findings, mass lesions or adenopathy. 3. Status post cholecystectomy with mild associated common bile duct dilatation. 4. Somewhat thickened appendix appears relatively stable. No acute inflammatory process. 5. Stable mixed lytic and sclerotic osseous metastatic disease.       02/07/2017 Imaging    Bone Scan Whole Body 02/07/17 IMPRESSION: No scintigraphic evidence skeletal metastasis        INTERIM HISTORY: 01/17/17 Michaela Little is here for a follow up of her metastatic breast cancer. I am taking over her care from Dr. Jana Hakim. This is my first time seeing her.   Today she is on her off week of Ibrance and is on 58m. She notes she feels exhausted and is often SOB. She has been having right sided abdominal pain that was worse 2 weeks ago, she say symptom management clinic and ER. She has been having ongoing pain in the right groin area but worsened lately. She is also having pain around her left lower ribs. She has not tried gabapentin and she  takes tramadol as needed for pain. Right now her pain is tolerable. Sometime her pain can be past 10/10 but now the pain is 6-7/10 She is taking her BP medication for her HTN, she started Flovent due to her current trouble breathing, given by Dr. Melvyn Novas. Her legs swell but no doppler has been done before. She also gets a lot of fluid in her stomach. She is also on Lasix, given by her PCP, Dr. Altamease Oiler. She has arthritis. She still has tissue expander in left breast where she has some pain and tenderness. Her PCP changed her heart medication because her heart rate was slow when he last saw her. She would like to get to the bottom of her issues as  she has difficulty falling asleep at night.  She lives by herself in a community residence. She lives in [place] but still also have doctors and family in [place] as well. She has 5 children. She does not drive. She does have SCAT forms to be filled out.    CURRENT TREATMENT:   1. letrozole and Ibrance started March 2017             -Ibrance dose decreased to 75 mg daily  -Letrozole switched to Exemestane on 02/13/17 due to b/l rib pain.  2. Xgeva started 11/16/2015, repeated monthly   INTERVAL HISTORY:  Michaela Little is here for a follow up of her metastatic breast cancer. She presents to the clinic today noting under her right rib cage she feels knots there.  She also notes swelling in her upper chest and neck. It will become red in that same area. She says she feels a knot in her posterior left neck. These knots are not always palpable, but positional. She shares she will manage her pain as she does not want to take narcotics.  She has switched to exemestane.She will start Ibrance on 1/8 for next cycle. She hopes to do her surgeries in the next 2 months. She filled out a form for home assistance with her other physician.      REVIEW OF SYSTEMS:   Constitutional: Denies fevers, chills or abnormal weight loss  Eyes: Denies blurriness of vision Ears, nose, mouth, throat, and face: Denies mucositis (+) Sore throat (+) dysphagia (+) feels swelling of neck and upper chest  Respiratory: Denies cough or wheezes (+) SOB  Cardiovascular: Denies palpitation, chest discomfort (+) Slight lower leg edema Gastrointestinal:  Denies nausea, heartburn or change in bowel habits (+) right side abdominal pain (+) feels knots under right ribcage  MSK: (+) pain in left ribs, now bilateral  Breast: (+) Still have expander in left breast, has pain/tenderness in left breast Skin: Denies abnormal skin rashes Lymphatics: Denies new lymphadenopathy or easy bruising (+) thyroid nodule palpable    Neurological:Denies numbness, tingling or new weaknesses Behavioral/Psych: Mood is stable, no new changes  All other systems were reviewed with the patient and are negative.  MEDICAL HISTORY:  Past Medical History:  Diagnosis Date  . Anxiety   . Arthritis    hands, knees, hips  . Asthma   . Cancer ([place])   . Cervical stenosis (uterine cervix)   . Dyspnea on exertion   . GERD (gastroesophageal reflux disease)   . Headache(784.0)   . History of breast cancer    2010--  LEFT  s/p  mastectomy (in [place]) AND CHEMORADIATION--  NO RECURRENCE  . History of cervical dysplasia   . History of TB skin testing    AS  TEEN--  [place]/ MEDS  . Hyperlipidemia   . Hypertension   . Malignant neoplasm of overlapping sites of left breast in female, estrogen receptor positive ([place]) 03/05/2013  . OSA (obstructive sleep apnea) moderate osa per study  09/2010   CPAP  , NOT USING ON REGULAR BASIS  . Pelvic pain in female   . Positive H. pylori test    08-05-2013  . Refusal of blood transfusions as patient is Jehovah's Witness   . Seasonal allergies   . Uterine fibroid   . Wears glasses     SURGICAL HISTORY: Past Surgical History:  Procedure Laterality Date  . CARDIOVASCULAR STRESS TEST  10-29-2012   low risk perfusion study/  no significant reversibity/ ef 66%/  normal wall motion  . CERVICAL CONIZATION W/BX  2012   in  [place] N/A 05/14/2012   Procedure: LAPAROSCOPIC CHOLECYSTECTOMY;  Surgeon: Ralene Ok, MD;  Location: [facility];  Service: General;  Laterality: N/A;  . COLONOSCOPY  2011   normal per patient - [place]  . DILATION AND CURETTAGE OF UTERUS  10/15/2011   Procedure: DILATATION AND CURETTAGE;  Surgeon: Melina Schools, MD;  Location: [facility];  Service: Gynecology;  Laterality: N/A;  Conization &  endocervical curettings  . EXAMINATION UNDER ANESTHESIA N/A 08/20/2013   Procedure: EXAM UNDER ANESTHESIA;  Surgeon: Margarette Asal, MD;  Location[facility];  Service:  Gynecology;  Laterality: N/A;  . [place]   right  . LAPAROSCOPIC ASSISTED VAGINAL HYSTERECTOMY N/A 10/26/2014   Procedure: HYSTERECTOMY ABDOMINAL ;  Surgeon: Molli Posey, MD;  Location: [facility];  Service: Gynecology;  Laterality: N/A;  . MASTECTOMY Left 11/2008  in [place]   . SALPINGOOPHORECTOMY Bilateral 10/26/2014   Procedure: BILATERAL SALPINGO OOPHORECTOMY;  Surgeon: Molli Posey, MD;  Location: [facility];  Service: Gynecology;  Laterality: Bilateral;  . TRANSTHORACIC ECHOCARDIOGRAM  10-29-2012   mild lvh/  ef 60-65%/  grade II diastolic dysfunction/  trivial mr  &  tr    I have reviewed the social history and family history with the patient and they are unchanged from previous note.  ALLERGIES:  is allergic to lisinopril-hydrochlorothiazide; adhesive [tape]; fentanyl; hctz [hydrochlorothiazide]; latex; lisinopril; losartan potassium; and oxycodone.  MEDICATIONS:  Current Outpatient Medications  Medication Sig Dispense Refill  . amLODipine (NORVASC) 10 MG tablet Take 1 tablet (10 mg total) by mouth daily. 90 tablet 3  . Calcium Carbonate (CALCIUM 600 PO) Take 1 tablet by mouth daily.     . carvedilol (COREG) 6.25 MG tablet Take 1 tablet (6.25 mg total) by mouth 2 (two) times daily with a meal. 180 tablet 3  . Cholecalciferol (VITAMIN D3) 5000 units CAPS Take 1 capsule by mouth daily.     . diphenhydrAMINE (BENADRYL) 25 mg capsule Take 25 mg by mouth every 6 (six) hours as needed for allergies.    Marland Kitchen exemestane (AROMASIN) 25 MG tablet Take 1 tablet (25 mg total) by mouth daily after breakfast. 30 tablet 2  . fluticasone (FLOVENT HFA) 110 MCG/ACT inhaler Inhale 2 puffs into the lungs 2 (two) times daily. With spacer 1 Inhaler 5  . furosemide (LASIX) 20 MG tablet Take 1 tablet (20 mg total) by mouth daily. 90 tablet 3  . hydrOXYzine (ATARAX/VISTARIL) 25 MG tablet Take 1 tablet (25 mg total) by mouth 2 (two) times daily. 40 tablet 0  . IBRANCE 75 MG capsule TAKE  1 CAPSULE (75MG) BY  MOUTH DAILY. TAKE WHOLE WITH FOOD. 21 capsule 3  . pantoprazole (PROTONIX) 40 MG tablet Take 1 tablet (40 mg total) by mouth daily. Take 30-60 min before first meal of the day 30 tablet 2  . promethazine (PHENERGAN) 25 MG tablet Take 1 tablet (25 mg total) by mouth every 6 (six) hours as needed for nausea or vomiting. 30 tablet 0  . ranitidine (ZANTAC) 300 MG tablet Take 1 tablet (300 mg total) by mouth at bedtime. 30 tablet 5  . traMADol (ULTRAM) 50 MG tablet Take 1 tablet (50 mg total) by mouth every 8 (eight) hours as needed. 30 tablet 0  . albuterol (PROVENTIL HFA;VENTOLIN HFA) 108 (90 Base) MCG/ACT inhaler Inhale 2 puffs into the lungs every 6 (six) hours as needed for wheezing or shortness of breath.    Marland Kitchen BLACK CURRANT SEED OIL PO Take 15 mLs by mouth daily as needed (inflammation).    . famotidine (PEPCID) 20 MG tablet Take 1 TABLET at bedtime (Patient not taking: Reported on 02/20/2017) 30 tablet 2  . montelukast (SINGULAIR) 10 MG tablet Take 1 tablet (10 mg total) by mouth at bedtime. (Patient not taking: Reported on 01/02/2017) 30 tablet 5   No current facility-administered medications for this visit.     PHYSICAL EXAMINATION: ECOG PERFORMANCE STATUS: 2 - Symptomatic, <50% confined to bed  Vitals:   03/29/17 1231  BP: (!) 122/54  Pulse: 69  Resp: 20  Temp: 98.5 F (36.9 C)  SpO2: 98%   Filed Weights   03/29/17 1231  Weight: 287 lb 12.8 oz (130.5 kg)    GENERAL:alert, no distress and comfortable SKIN: skin color, texture, turgor are normal, no rashes or significant lesions EYES: normal, Conjunctiva are pink and non-injected, sclera clear OROPHARYNX:no exudate, no erythema and lips, buccal mucosa, and tongue normal  NECK: supple, thyroid normal size, non-tender, (+)palpable right thyroid nodule  LYMPH:  no palpable lymphadenopathy in the cervical, axillary or inguinal LUNGS: clear to auscultation and percussion with normal breathing effort HEART:  regular rate & rhythm and no murmurs and no lower extremity edema ABDOMEN:abdomen soft, non-tender and normal bowel sounds (+) significant tenderness in right lateral rib cage, mild tenderness in left lateral rib, abdominal exam negative.  Musculoskeletal:no cyanosis of digits and no clubbing  NEURO: alert & oriented x 3 with fluent speech, no focal motor/sensory deficits Breast: (+) S/p left mastectomy: She has mild tenderness around the tissue expander, no palpable mass or adenopathy. right breast exam negative    LABORATORY DATA:  I have reviewed the data as listed CBC Latest Ref Rng & Units 03/29/2017 02/20/2017 02/13/2017  WBC 3.9 - 10.3 10e3/uL 2.5(L) 2.9(L) 4.2  Hemoglobin 11.6 - 15.9 g/dL 11.8 11.9 12.1  Hematocrit 34.8 - 46.6 % 36.0 36.0 36.1  Platelets 145 - 400 10e3/uL 254 229 273     CMP Latest Ref Rng & Units 03/29/2017 02/20/2017 02/13/2017  Glucose 70 - 140 mg/dl 117 98 99  BUN 7.0 - 26.0 mg/dL 16.3 11.4 10.5  Creatinine 0.6 - 1.1 mg/dL 0.9 0.8 0.9  Sodium 136 - 145 mEq/L 139 142 141  Potassium 3.5 - 5.1 mEq/L 3.9 4.0 4.0  Chloride 101 - 111 mmol/L - - -  CO2 22 - 29 mEq/L _0 Calcium 8.4 - 10.4 mg/dL 8.9 9.0 8.6  Total Protein 6.4 - 8.3 g/dL 7.5 7.5 7.4  Total Bilirubin 0.20 - 1.20 mg/dL 0.72 1.66(H) 1.31(H)  Alkaline Phos 40 - 150 U/L 109 86 87  AST 5 - 34 U/L _0 ALT 0-55 U/L U/L <_1 PROCEDURES  ECHO 01/22/17 Study Conclusions - Left ventricle: The cavity size was normal. There was moderate   concentric hypertrophy. Systolic function was normal. The   estimated ejection fraction was in the range of 55% to 60%. Wall   motion was normal; there were no regional wall motion   abnormalities. Doppler parameters are consistent with abnormal   left ventricular relaxation (grade 1 diastolic dysfunction).   RADIOGRAPHIC STUDIES: I have personally reviewed the radiological images as listed and agreed with the findings in the report. No results found.     CT AP W Contrast 02/07/17 IMPRESSION: 1. No CT findings for abdominal/pelvic metastatic disease. 2. No acute abdominal findings, mass lesions or adenopathy. 3. Status post cholecystectomy with mild associated common bile duct dilatation. 4. Somewhat thickened appendix appears relatively stable. No acute inflammatory process. 5. Stable mixed lytic and sclerotic osseous metastatic disease.   Bone Scan Whole Body 02/07/17 IMPRESSION: No scintigraphic evidence skeletal metastasis    ASSESSMENT & PLAN:  Michaela Little is a 57 y.o. female with a history of HTN, HLD, Arthritis, Asthma, Sleep Apnea, and H. Pylori.   1. 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 -She has metastatic ER positive breast cancer to bones, has been on letrozole and Ibrance since March 2017. She has had her oncological care both in [place] and [place], her last PET scan was in December 2017 and last CT scan was in April 2018. Due to her worsening bilateral rib cage pain, I obtained restaging CT abdomen and pelvis with contrast, and bone scan on 02/07/2017. I reviewed the scan findings with pt, which showed stable lipomatosis, not hypermetabolic on bone scan, no other new lesions.  No evidence of disease progression on the restaging scan -Given her increase b/l rib cage pain, possible related to her letrozole along with her bone mets, I switched her to Exemestane (02/13/17). Potential side effects reviewed with her again, she agrees to proceed. -Dr. Ninfa Linden will do her appendectomy and Dr. Harlow Mares will do her breast reconstruction surgery on the same day. She will notify me of this date as she will need to be off Ibrance at that time.  -Her last Delton See was 02/13/17, will proceed with injection today and continue monthly  -Continue Ibrance to restart next cycle on 1/8 and continue exemestane -Labs reviewed, WBC at  2.5, otherwise her CBC and CMP are WNL. Her Ca 27.29 still pending  -f/u in 4 weeks with lab and injection   -repeat staging scan in March 2019   2. Primary breast cancer of left breast overlapping sites diagnosed March of 2010 and treated neo-adjuvantly at [facility] with (a) paclitaxel weekly x12 and (b) doxorubicin/ cyclophosphamide in dose dense fashion x4, both given with tifiparnib.  -definitive left modified radical mastectomy September of 2010 for a T2 N2 or stage IIIA invasive lobular breast cancer, estrogen and progesterone receptor positive, HER-2 negative,  -post mastectomy radiation completed January of 2011, -briefly on tamoxifen, discontinued it because of uterine lining concerns.  On anastrozole since May 2012 with good tolerance. Held between November and December 2013 due to GI issues, unrelated to cancer or its treatment. Discontinued by the patient summer of 2016  3. Bilateral rib cage pain, Left Breast pain -Pain current 6-7/10 -She takes Tylenol and tramadol as needed -will  restaging her to rule out progression of bone metastasis  -02/07/17 Bone scan showed no evidence of hypermetabolic bone metastasis.  -Bone pain has increased to bilateral ribs. She does not want to take narcotics and only takes tylenol as needed. I have switched her letrozole to Exemestane but her pain has not improved. Exam was unremarkable except mild tenderness in the right side rib cage. -I suggest if Tylenol is not controlling her pain she can use the tramadol that was prescribed to her along with heating pads.  -She declined narcotics, but I suggest she continue to take tylenol, advil or alieve for her pain as we want to help her control her pain.   4. Lower Extremity Edema -She is on Lasix, given by her PCP Dr. Altamease Oiler -I suggest compression socks and advices her to elevate her feet when sitting at home.  -Repeat echocardiogram in 12/2016 shows normal EF  5. Dyspnea  -Uses Flovent -Ordered  ECHO to rule out cardiomyopathy, she had chemo Adramycin before which can cause heart failure  -01/22/17 ECHO showed normal EF 55-60%, and grade 1 diastolic dysfunction -slightly improved lately   6. Insomnia  -She is on hydroxyzine already  -I suggest her to try melatonin -She was given gabapentin in the past, I advised if it is not working for her she can stop.   7. Left breast implant rupture  -She plans to have implant replacement by Dr. Marica Otter soon  8. thickened Appendix  -This is incidental finding on her CT scan, stable since 07/2016 -Patient is very concerned, she was seen by surgeon, and plan to have appendicectomy soon   9. Goal of care discussion  -We discussed the incurable nature of her cancer, and the overall poor prognosis, especially if she does not have good response to chemotherapy or progress on chemo -The patient understands the goal of care is palliative. -she is full code now  PLAN:  -Xgeva injection today  -Continue Ibrance, plan to start next cycle on 1/8. Will hold Ibrance for her surgery  -Continue exemestane  -Lab,  F/u injection in 1 and 2 months, if she is stable, plan to see her every 2-3 months in future     No orders of the defined types were placed in this encounter.  All questions were answered. The patient knows to call the clinic with any problems, questions or concerns. No barriers to learning was detected.  I spent 30 minutes counseling the patient face to face. The total time spent in the appointment was 40 minutes and more than 50% was on counseling and review of test results     [name], MD 03/29/2017    This document serves as a record of services personally performed by [name], MD. It was created on her behalf by Joslyn Devon, a trained medical scribe. The creation of this record is based on the scribe's personal observations and the provider's statements to them.    I have reviewed the above documentation for accuracy and  completeness, and I agree with the above.

## 2017-03-29 ENCOUNTER — Ambulatory Visit (HOSPITAL_BASED_OUTPATIENT_CLINIC_OR_DEPARTMENT_OTHER): Payer: Medicare Other

## 2017-03-29 ENCOUNTER — Other Ambulatory Visit (HOSPITAL_BASED_OUTPATIENT_CLINIC_OR_DEPARTMENT_OTHER): Payer: Medicare Other

## 2017-03-29 ENCOUNTER — Encounter: Payer: Self-pay | Admitting: Hematology

## 2017-03-29 ENCOUNTER — Inpatient Hospital Stay: Payer: Medicare Other | Attending: Hematology | Admitting: Hematology

## 2017-03-29 VITALS — BP 122/54 | HR 69 | Temp 98.5°F | Resp 20 | Ht 67.0 in | Wt 287.8 lb

## 2017-03-29 DIAGNOSIS — Z7189 Other specified counseling: Secondary | ICD-10-CM

## 2017-03-29 DIAGNOSIS — C50812 Malignant neoplasm of overlapping sites of left female breast: Secondary | ICD-10-CM | POA: Diagnosis not present

## 2017-03-29 DIAGNOSIS — C7951 Secondary malignant neoplasm of bone: Secondary | ICD-10-CM

## 2017-03-29 DIAGNOSIS — R0781 Pleurodynia: Secondary | ICD-10-CM | POA: Diagnosis not present

## 2017-03-29 DIAGNOSIS — C50912 Malignant neoplasm of unspecified site of left female breast: Secondary | ICD-10-CM | POA: Diagnosis not present

## 2017-03-29 DIAGNOSIS — Z170 Estrogen receptor positive status [ER+]: Secondary | ICD-10-CM | POA: Diagnosis not present

## 2017-03-29 LAB — CBC WITH DIFFERENTIAL/PLATELET
BASO%: 1.3 % (ref 0.0–2.0)
BASOS ABS: 0 10*3/uL (ref 0.0–0.1)
EOS%: 3.8 % (ref 0.0–7.0)
Eosinophils Absolute: 0.1 10*3/uL (ref 0.0–0.5)
HCT: 36 % (ref 34.8–46.6)
HEMOGLOBIN: 11.8 g/dL (ref 11.6–15.9)
LYMPH%: 30 % (ref 14.0–49.7)
MCH: 29.9 pg (ref 25.1–34.0)
MCHC: 32.9 g/dL (ref 31.5–36.0)
MCV: 91 fL (ref 79.5–101.0)
MONO#: 0.1 10*3/uL (ref 0.1–0.9)
MONO%: 5.6 % (ref 0.0–14.0)
NEUT#: 1.5 10*3/uL (ref 1.5–6.5)
NEUT%: 59.3 % (ref 38.4–76.8)
Platelets: 254 10*3/uL (ref 145–400)
RBC: 3.96 10*6/uL (ref 3.70–5.45)
RDW: 14.9 % — AB (ref 11.2–14.5)
WBC: 2.5 10*3/uL — AB (ref 3.9–10.3)
lymph#: 0.8 10*3/uL — ABNORMAL LOW (ref 0.9–3.3)

## 2017-03-29 LAB — COMPREHENSIVE METABOLIC PANEL
ALBUMIN: 3.7 g/dL (ref 3.5–5.0)
ALK PHOS: 109 U/L (ref 40–150)
ALT: <6 U/L (ref 0–55)
AST: 9 U/L (ref 5–34)
Anion Gap: 7 mEq/L (ref 3–11)
BUN: 16.3 mg/dL (ref 7.0–26.0)
CHLORIDE: 108 meq/L (ref 98–109)
CO2: 23 mEq/L (ref 22–29)
Calcium: 8.9 mg/dL (ref 8.4–10.4)
Creatinine: 0.9 mg/dL (ref 0.6–1.1)
EGFR: >60 mL/min/{1.73_m2} (ref >60)
GLUCOSE: 117 mg/dL (ref 70–140)
POTASSIUM: 3.9 meq/L (ref 3.5–5.1)
SODIUM: 139 meq/L (ref 136–145)
Total Bilirubin: 0.72 mg/dL (ref 0.20–1.20)
Total Protein: 7.5 g/dL (ref 6.4–8.3)

## 2017-03-29 MED ORDER — DENOSUMAB 120 MG/1.7ML ~~LOC~~ SOLN
120.0000 mg | Freq: Once | SUBCUTANEOUS | Status: AC
  Administered 2017-03-29: 120 mg via SUBCUTANEOUS

## 2017-03-30 LAB — CANCER ANTIGEN 27.29: CA 27.29: 18.8 U/mL (ref 0.0–38.6)

## 2017-04-01 ENCOUNTER — Telehealth: Payer: Self-pay | Admitting: Hematology

## 2017-04-01 NOTE — Telephone Encounter (Signed)
Spoke with patient re appointments for February and March. Patient to get updated schedule at February visit.

## 2017-04-08 ENCOUNTER — Other Ambulatory Visit: Payer: Self-pay | Admitting: Internal Medicine

## 2017-04-08 ENCOUNTER — Other Ambulatory Visit: Payer: Self-pay | Admitting: Hematology

## 2017-04-08 DIAGNOSIS — R0609 Other forms of dyspnea: Principal | ICD-10-CM

## 2017-04-12 ENCOUNTER — Encounter (INDEPENDENT_AMBULATORY_CARE_PROVIDER_SITE_OTHER): Payer: Self-pay | Admitting: Physician Assistant

## 2017-04-12 ENCOUNTER — Other Ambulatory Visit: Payer: Self-pay

## 2017-04-12 ENCOUNTER — Telehealth (INDEPENDENT_AMBULATORY_CARE_PROVIDER_SITE_OTHER): Payer: Self-pay | Admitting: Physician Assistant

## 2017-04-12 ENCOUNTER — Ambulatory Visit (INDEPENDENT_AMBULATORY_CARE_PROVIDER_SITE_OTHER): Payer: Medicare Other | Admitting: Physician Assistant

## 2017-04-12 VITALS — BP 124/75 | HR 61 | Temp 97.8°F | Wt 286.6 lb

## 2017-04-12 DIAGNOSIS — M62838 Other muscle spasm: Secondary | ICD-10-CM | POA: Diagnosis not present

## 2017-04-12 DIAGNOSIS — M170 Bilateral primary osteoarthritis of knee: Secondary | ICD-10-CM | POA: Diagnosis not present

## 2017-04-12 DIAGNOSIS — G4700 Insomnia, unspecified: Secondary | ICD-10-CM | POA: Diagnosis not present

## 2017-04-12 MED ORDER — NAPROXEN 500 MG PO TABS: 500.0000 mg | ORAL_TABLET | Freq: Two times a day (BID) | ORAL | 0 refills | Status: DC

## 2017-04-12 MED ORDER — CYCLOBENZAPRINE HCL 10 MG PO TABS: 10.0000 mg | ORAL_TABLET | Freq: Two times a day (BID) | ORAL | 0 refills | Status: DC

## 2017-04-12 MED ORDER — ZOLPIDEM TARTRATE 5 MG PO TABS: 5.0000 mg | ORAL_TABLET | Freq: Every evening | ORAL | 0 refills | Status: DC | PRN

## 2017-04-12 NOTE — Telephone Encounter (Signed)
FWD to PCP. Tempestt S Roberts, CMA  

## 2017-04-12 NOTE — Patient Instructions (Signed)

## 2017-04-12 NOTE — Telephone Encounter (Signed)
I have filled out paperwork again. Receptionist will fax today.

## 2017-04-12 NOTE — Progress Notes (Signed)
Subjective:  Patient ID: Michaela Little, female    DOB: 11/13/1960  Age: 56 y.o. MRN: [id number]  CC: knee pain  HPI  Michaela J Scottis a 57 y.o.femalewith a PMH of Anxiety, Breast CA, HTN, OSA, Asthma, and dyspnea presents for bilateral knee pain. Has osteoarthritic changes and spurring. Says she was supposed to have surgery for osteoarthritic changes but was then found to have cancer which derailed plans for knee surgery.    Pt having trouble sleeping.  Tosses and turns throughout the night. Has muscle cramps. Onconologist prescribed gabapentin for insomnia and tried only twice. First night with "a little sleep" and second night had no sleep. Stopped Gabapentin on her own.    Patient is set to have appendectomy and removal of breast expander 2/2 breast cancer.   Outpatient Medications Prior to Visit  Medication Sig Dispense Refill  . albuterol (PROVENTIL HFA;VENTOLIN HFA) 108 (90 Base) MCG/ACT inhaler Inhale 2 puffs into the lungs every 6 (six) hours as needed for wheezing or shortness of breath.    Marland Kitchen amLODipine (NORVASC) 10 MG tablet Take 1 tablet (10 mg total) by mouth daily. 90 tablet 3  . Calcium Carbonate (CALCIUM 600 PO) Take 1 tablet by mouth daily.     . carvedilol (COREG) 6.25 MG tablet Take 1 tablet (6.25 mg total) by mouth 2 (two) times daily with a meal. 180 tablet 3  . Cholecalciferol (VITAMIN D3) 5000 units CAPS Take 1 capsule by mouth daily.     . diphenhydrAMINE (BENADRYL) 25 mg capsule Take 25 mg by mouth every 6 (six) hours as needed for allergies.    Marland Kitchen exemestane (AROMASIN) 25 MG tablet Take 1 tablet (25 mg total) by mouth daily after breakfast. 30 tablet 2  . famotidine (PEPCID) 20 MG tablet Take 1 TABLET at bedtime 30 tablet 2  . fluticasone (FLOVENT HFA) 110 MCG/ACT inhaler Inhale 2 puffs into the lungs 2 (two) times daily. With spacer 1 Inhaler 5  . furosemide (LASIX) 20 MG tablet Take 1 tablet (20 mg total) by mouth daily. 90 tablet 3  . hydrOXYzine  (ATARAX/VISTARIL) 25 MG tablet Take 1 tablet (25 mg total) by mouth 2 (two) times daily. 40 tablet 0  . IBRANCE 75 MG capsule TAKE 1 CAPSULE (75MG ) BY MOUTH DAILY. TAKE WHOLE WITH FOOD. 21 capsule 3  . pantoprazole (PROTONIX) 40 MG tablet Take 1 tablet (40 mg total) by mouth daily. Take 30-60 min before first meal of the day 30 tablet 2  . promethazine (PHENERGAN) 25 MG tablet Take 1 tablet (25 mg total) by mouth every 6 (six) hours as needed for nausea or vomiting. 30 tablet 0  . BLACK CURRANT SEED OIL PO Take 15 mLs by mouth daily as needed (inflammation).    . montelukast (SINGULAIR) 10 MG tablet Take 1 tablet (10 mg total) by mouth at bedtime. (Patient not taking: Reported on 01/02/2017) 30 tablet 5  . ranitidine (ZANTAC) 300 MG tablet Take 1 tablet (300 mg total) by mouth at bedtime. (Patient not taking: Reported on 04/12/2017) 30 tablet 5  . traMADol (ULTRAM) 50 MG tablet Take 1 tablet (50 mg total) by mouth every 8 (eight) hours as needed. (Patient not taking: Reported on 04/12/2017) 30 tablet 0   No facility-administered medications prior to visit.      ROS Review of Systems  Constitutional: Negative for chills, fever and malaise/fatigue.  Eyes: Negative for blurred vision.  Respiratory: Negative for shortness of breath.   Cardiovascular: Negative for chest  pain and palpitations.  Gastrointestinal: Negative for abdominal pain and nausea.  Genitourinary: Negative for dysuria and hematuria.  Musculoskeletal: Positive for joint pain and myalgias.  Skin: Negative for rash.  Neurological: Negative for tingling and headaches.  Psychiatric/Behavioral: Negative for depression. The patient has insomnia. The patient is not nervous/anxious.     Objective:  BP 124/75 (BP Location: Right Arm, Patient Position: Sitting, Cuff Size: Large)   Pulse 61   Temp 97.8 F (36.6 C) (Oral)   Wt 286 lb 9.6 oz (130 kg)   LMP 02/25/2009   SpO2 100%   BMI 44.89 kg/m   BP/Weight 04/12/2017 03/29/2017  28/31/5176  Systolic BP 160 737 106  Diastolic BP 75 54 48  Wt. (Lbs) 286.6 287.8 283.2  BMI 44.89 45.08 15.06      Physical Exam  Constitutional: She is oriented to person, place, and time.  Well developed, well nourished, NAD, polite  HENT:  Head: Normocephalic and atraumatic.  Eyes: No scleral icterus.  Neck: Normal range of motion. Neck supple. No thyromegaly present.  Cardiovascular: Normal rate, regular rhythm and normal heart sounds.  Pulmonary/Chest: Effort normal and breath sounds normal.  Abdominal: Soft. Bowel sounds are normal. There is no tenderness.  Musculoskeletal: She exhibits no edema.  Increased muscular tonicity of the lower back   Neurological: She is alert and oriented to person, place, and time. No cranial nerve deficit. Coordination normal.  Skin: Skin is warm and dry. No rash noted. No erythema. No pallor.  Psychiatric: She has a normal mood and affect. Her behavior is normal. Thought content normal.  Vitals reviewed.    Assessment & Plan:    1. Osteoarthritis of both knees, unspecified osteoarthritis type - AMB referral to orthopedics - Begin naproxen (NAPROSYN) 500 MG tablet; Take 1 tablet (500 mg total) by mouth 2 (two) times daily with a meal.  Dispense: 30 tablet; Refill: 0  2. Insomnia, unspecified type - Begin zolpidem (AMBIEN) 5 MG tablet; Take 1 tablet (5 mg total) by mouth at bedtime as needed for sleep.  Dispense: 30 tablet; Refill: 0  3. Muscle spasm - Begin cyclobenzaprine (FLEXERIL) 10 MG tablet; Take 1 tablet (10 mg total) by mouth 2 (two) times daily.  Dispense: 30 tablet; Refill: 0   Meds ordered this encounter  Medications  . cyclobenzaprine (FLEXERIL) 10 MG tablet    Sig: Take 1 tablet (10 mg total) by mouth 2 (two) times daily.    Dispense:  30 tablet    Refill:  0    Order Specific Question:   Supervising Provider    Answer:   Tresa Garter [id number]  . zolpidem (AMBIEN) 5 MG tablet    Sig: Take 1 tablet (5 mg  total) by mouth at bedtime as needed for sleep.    Dispense:  30 tablet    Refill:  0    Order Specific Question:   Supervising Provider    Answer:   Tresa Garter [id number]  . naproxen (NAPROSYN) 500 MG tablet    Sig: Take 1 tablet (500 mg total) by mouth 2 (two) times daily with a meal.    Dispense:  30 tablet    Refill:  0    Order Specific Question:   Supervising Provider    Answer:   Tresa Garter [id number]    Follow-up: Return if symptoms worsen or fail to improve.   Clent Demark PA

## 2017-04-12 NOTE — Telephone Encounter (Signed)
Michaela Little) came to the office since according to the state they need an update form to be fill out and refax to them at [facility]) [phone] or [phone] as well they asking for you to fax a copy to [facility] [phone], please follow up

## 2017-04-12 NOTE — Progress Notes (Signed)
Pt needs refill for hydroxyzine  Pt also complains of pain in both knees

## 2017-04-15 ENCOUNTER — Other Ambulatory Visit (INDEPENDENT_AMBULATORY_CARE_PROVIDER_SITE_OTHER): Payer: Self-pay | Admitting: Physician Assistant

## 2017-04-15 ENCOUNTER — Other Ambulatory Visit: Payer: Self-pay | Admitting: Medical

## 2017-04-15 DIAGNOSIS — R609 Edema, unspecified: Secondary | ICD-10-CM

## 2017-04-15 DIAGNOSIS — M5414 Radiculopathy, thoracic region: Secondary | ICD-10-CM

## 2017-04-15 MED ORDER — EXEMESTANE 25 MG PO TABS: 25.0000 mg | ORAL_TABLET | Freq: Every day | ORAL | 2 refills | Status: DC

## 2017-04-15 NOTE — Telephone Encounter (Signed)
FWD to PCP. Tempestt S Roberts, CMA  

## 2017-04-24 ENCOUNTER — Telehealth: Payer: Self-pay | Admitting: Hematology

## 2017-04-24 ENCOUNTER — Ambulatory Visit: Payer: Medicare Other | Admitting: Allergy and Immunology

## 2017-04-24 NOTE — Telephone Encounter (Signed)
Patient left voicemail to reschedule

## 2017-04-25 NOTE — Telephone Encounter (Signed)
Forwarded to Dr Burr Medico and her nurse - who is now attending patient.

## 2017-04-26 ENCOUNTER — Ambulatory Visit: Payer: Medicare Other | Admitting: Hematology

## 2017-04-26 ENCOUNTER — Ambulatory Visit: Payer: Medicare Other

## 2017-04-26 ENCOUNTER — Other Ambulatory Visit: Payer: Medicare Other

## 2017-05-06 ENCOUNTER — Other Ambulatory Visit (INDEPENDENT_AMBULATORY_CARE_PROVIDER_SITE_OTHER): Payer: Self-pay | Admitting: Physician Assistant

## 2017-05-06 DIAGNOSIS — L299 Pruritus, unspecified: Secondary | ICD-10-CM

## 2017-05-08 ENCOUNTER — Ambulatory Visit (INDEPENDENT_AMBULATORY_CARE_PROVIDER_SITE_OTHER): Payer: Medicare Other

## 2017-05-08 ENCOUNTER — Ambulatory Visit (INDEPENDENT_AMBULATORY_CARE_PROVIDER_SITE_OTHER): Payer: Medicare Other | Admitting: Orthopedic Surgery

## 2017-05-08 ENCOUNTER — Encounter (INDEPENDENT_AMBULATORY_CARE_PROVIDER_SITE_OTHER): Payer: Self-pay | Admitting: Orthopedic Surgery

## 2017-05-08 DIAGNOSIS — M170 Bilateral primary osteoarthritis of knee: Secondary | ICD-10-CM | POA: Diagnosis not present

## 2017-05-11 ENCOUNTER — Encounter (INDEPENDENT_AMBULATORY_CARE_PROVIDER_SITE_OTHER): Payer: Self-pay | Admitting: Orthopedic Surgery

## 2017-05-11 NOTE — Progress Notes (Signed)
Michaela Little  Telephone:([phone]) [phone] Fax:(336) [phone]  Clinic Follow up Note   Patient Care Team: [name] as PCP - General (Physician Assistant) [name], MD as Consulting Physician (Pulmonary Disease) [name], MD as Consulting Physician (Hematology)   Date of Service:  05/13/2017   CHIEF COMPLAINT: F/U metastatic breast cancer   SUMMARY OF ONCOLOGIC HISTORY: Oncology History   Cancer Staging Malignant neoplasm of overlapping sites of left breast in female, estrogen receptor positive ([place]) Staging form: Breast, AJCC 7th Edition - Clinical: Stage IIIA (T2, N2, M0) - Unsigned       Malignant neoplasm of overlapping sites of left breast in female, estrogen receptor positive ([place])   05/2008 Cancer Diagnosis    Left sided breast cancer (no path report available)      11/2008 Pathologic Stage    Stage IIIA: T2 N2       Neo-Adjuvant Chemotherapy    Paclitaxel weekly x 12; doxorubicin and cyclophosphamide x 4 - both given with trifiparnib (treated at [facility] in [place])      11/2008 Definitive Surgery    Left modified radiation mastectomy: invasive lobular carcinoma, ER+, PR+, HER2/neu negative, 5/22 LN positive       - 03/2009 Radiation Therapy    Adjuvant radiation to left chest wall      2011 - 08/2014 Anti-estrogen oral therapy    Tamoxifen 20 mg used briefly; discontinued due to uterine lining concerns; changed to anastrozole 1 mg daily (began 07/2100); discontinued by patient summer of 2016      05/26/2015 Progression    Iliac bone biopsy showed metastatic carcinoma consistent with a breast primary, ER positive, PR and HER-2 negative. Her staging CT, and a PET scan was negative for visceral metastasis.      05/2015 -  Anti-estrogen oral therapy    1. letrozole and Ibrance started March 2017             -Ibrance dose decreased to 75 mg daily  -Letrozole switched to Exemestane on 02/13/17 due to b/l rib pain.       11/16/2015 Miscellaneous    Xgeva monthly for bone metastasis       07/03/2016 Imaging    CT chest, abdomen and pelvis with contrast showed no evidence of metastasis.       02/07/2017 Imaging    CT AP W Contrast 02/07/17 IMPRESSION: 1. No CT findings for abdominal/pelvic metastatic disease. 2. No acute abdominal findings, mass lesions or adenopathy. 3. Status post cholecystectomy with mild associated common bile duct dilatation. 4. Somewhat thickened appendix appears relatively stable. No acute inflammatory process. 5. Stable mixed lytic and sclerotic osseous metastatic disease.       02/07/2017 Imaging    Bone Scan Whole Body 02/07/17 IMPRESSION: No scintigraphic evidence skeletal metastasis        INTERIM HISTORY: 01/17/17 Michaela Little is here for a follow up of her metastatic breast cancer. I am taking over her care from Dr. Jana Hakim. This is my first time seeing her.   Today she is on her off week of Ibrance and is on '75mg'$ . She notes she feels exhausted and is often SOB. She has been having right sided abdominal pain that was worse 2 weeks ago, she say symptom management clinic and ER. She has been having ongoing pain in the right groin area but worsened lately. She is also having pain around her left lower ribs. She has not tried gabapentin and she takes  tramadol as needed for pain. Right now her pain is tolerable. Sometime her pain can be past 10/10 but now the pain is 6-7/10 She is taking her BP medication for her HTN, she started Flovent due to her current trouble breathing, given by Dr. Melvyn Novas. Her legs swell but no doppler has been done before. She also gets a lot of fluid in her stomach. She is also on Lasix, given by her PCP, Dr. Altamease Oiler. She has arthritis. She still has tissue expander in left breast where she has some pain and tenderness. Her PCP changed her heart medication because her heart rate was slow when he last saw her. She would like to get to the bottom of her  issues as she has difficulty falling asleep at night.  She lives by herself in a community residence. She lives in [place] but still also have doctors and family in [place] as well. She has 5 children. She does not drive. She does have SCAT forms to be filled out.    CURRENT TREATMENT:   1. letrozole and Ibrance started March 2017             -Ibrance dose decreased to 75 mg daily  -Letrozole switched to Exemestane on 02/13/17 due to b/l rib pain.  2. Xgeva started 11/16/2015, repeated monthly   INTERVAL HISTORY:  Michaela Little is here for a follow up of her metastatic breast cancer. She presents to the clinic today by herself. She reports she is having knee pain and is being followed by an orthopedist. Her scan revealed arthritis. She will start PT and possibly injections. She also notes she is having increased SOB today when walking. She reports continued pain in her right rib area. She uses a muscle relaxer, tramadol and naproxen to control her pain. She is planning to have her appendix removed by Dr. Ninfa Linden. She is also going to have her left breast implant removed by Dr. Marica Otter and switch to a prosthetic bra.   On review of systems, pt denies fever, or any other complaints at this time. Pertinent positives are listed and detailed within the above HPI.    REVIEW OF SYSTEMS:   Constitutional: Denies fevers, chills or abnormal weight loss  Eyes: Denies blurriness of vision Ears, nose, mouth, throat, and face: Denies mucositis, sore throat (+) feels swelling of neck and upper chest  Respiratory: Denies cough or wheezes (+) SOB  Cardiovascular: Denies palpitation, chest discomfort (+) Slight lower leg edema Gastrointestinal:  Denies nausea, heartburn or change in bowel habits (+) right side abdominal pain (+) feels knots under right ribcage  MSK: (+) pain in left ribs, now bilateral  Breast: (+) Still have expander in left breast, has pain/tenderness in left breast Skin: Denies abnormal  skin rashes Lymphatics: Denies new lymphadenopathy or easy bruising (+) thyroid nodule palpable  Neurological:Denies numbness, tingling or new weaknesses Behavioral/Psych: Mood is stable, no new changes  All other systems were reviewed with the patient and are negative.  MEDICAL HISTORY:  Past Medical History:  Diagnosis Date  . Anxiety   . Arthritis    hands, knees, hips  . Asthma   . Cancer ([place])   . Cervical stenosis (uterine cervix)   . Dyspnea on exertion   . GERD (gastroesophageal reflux disease)   . Headache(784.0)   . History of breast cancer    2010--  LEFT  s/p  mastectomy (in [place]) AND CHEMORADIATION--  NO RECURRENCE  . History of cervical dysplasia   .  History of TB skin testing    [place] TEEN--  [place]/ MEDS  . Hyperlipidemia   . Hypertension   . Malignant neoplasm of overlapping sites of left breast in female, estrogen receptor positive ([place]) 03/05/2013  . OSA (obstructive sleep apnea) moderate osa per study  09/2010   CPAP  , NOT USING ON REGULAR BASIS  . Pelvic pain in female   . Positive H. pylori test    08-05-2013  . Refusal of blood transfusions as patient is Jehovah's Witness   . Seasonal allergies   . Uterine fibroid   . Wears glasses     SURGICAL HISTORY: Past Surgical History:  Procedure Laterality Date  . CARDIOVASCULAR STRESS TEST  10-29-2012   low risk perfusion study/  no significant reversibity/ ef 66%/  normal wall motion  . CERVICAL CONIZATION W/BX  2012   in  [place] N/A 05/14/2012   Procedure: LAPAROSCOPIC CHOLECYSTECTOMY;  Surgeon: Ralene Ok, MD;  Location: [facility];  Service: General;  Laterality: N/A;  . COLONOSCOPY  2011   normal per patient - [place]  . DILATION AND CURETTAGE OF UTERUS  10/15/2011   Procedure: DILATATION AND CURETTAGE;  Surgeon: Melina Schools, MD;  Location: [facility];  Service: Gynecology;  Laterality: N/A;  Conization &  endocervical curettings  . EXAMINATION UNDER ANESTHESIA N/A 08/20/2013   Procedure: EXAM  UNDER ANESTHESIA;  Surgeon: Margarette Asal, MD;  Location: [facility];  Service: Gynecology;  Laterality: N/A;  . [place]   right  . LAPAROSCOPIC ASSISTED VAGINAL HYSTERECTOMY N/A 10/26/2014   Procedure: HYSTERECTOMY ABDOMINAL ;  Surgeon: Molli Posey, MD;  Location: [facility];  Service: Gynecology;  Laterality: N/A;  . MASTECTOMY Left 11/2008  in [place]   . SALPINGOOPHORECTOMY Bilateral 10/26/2014   Procedure: BILATERAL SALPINGO OOPHORECTOMY;  Surgeon: Molli Posey, MD;  Location: [facility];  Service: Gynecology;  Laterality: Bilateral;  . TRANSTHORACIC ECHOCARDIOGRAM  10-29-2012   mild lvh/  ef 60-65%/  grade II diastolic dysfunction/  trivial mr  &  tr    I have reviewed the social history and family history with the patient and they are unchanged from previous note.  ALLERGIES:  is allergic to lisinopril-hydrochlorothiazide; adhesive [tape]; fentanyl; hctz [hydrochlorothiazide]; latex; lisinopril; losartan potassium; and oxycodone.  MEDICATIONS:  Current Outpatient Medications  Medication Sig Dispense Refill  . albuterol (PROVENTIL HFA;VENTOLIN HFA) 108 (90 Base) MCG/ACT inhaler Inhale 2 puffs into the lungs every 6 (six) hours as needed for wheezing or shortness of breath.    Marland Kitchen amLODipine (NORVASC) 10 MG tablet Take 1 tablet (10 mg total) by mouth daily. 90 tablet 3  . BLACK CURRANT SEED OIL PO Take 15 mLs by mouth daily as needed (inflammation).    . Calcium Carbonate (CALCIUM 600 PO) Take 1 tablet by mouth daily.     . carvedilol (COREG) 6.25 MG tablet Take 1 tablet (6.25 mg total) by mouth 2 (two) times daily with a meal. 180 tablet 3  . Cholecalciferol (VITAMIN D3) 5000 units CAPS Take 1 capsule by mouth daily.     . cyclobenzaprine (FLEXERIL) 10 MG tablet Take 1 tablet (10 mg total) by mouth 2 (two) times daily. 30 tablet 0  . diphenhydrAMINE (BENADRYL) 25 mg capsule Take 25 mg by mouth every 6 (six) hours as needed for allergies.    Marland Kitchen  exemestane (AROMASIN) 25 MG tablet Take 1 tablet (25 mg total) by mouth daily  after breakfast. 30 tablet 2  . famotidine (PEPCID) 20 MG tablet Take 1 TABLET at bedtime 30 tablet 2  . fluticasone (FLOVENT HFA) 110 MCG/ACT inhaler Inhale 2 puffs into the lungs 2 (two) times daily. With spacer 1 Inhaler 5  . furosemide (LASIX) 20 MG tablet Take 1 tablet (20 mg total) by mouth daily. 90 tablet 3  . hydrOXYzine (ATARAX/VISTARIL) 25 MG tablet Take 1 tablet (25 mg total) by mouth 2 (two) times daily. 40 tablet 0  . montelukast (SINGULAIR) 10 MG tablet Take 1 tablet (10 mg total) by mouth at bedtime. 30 tablet 5  . naproxen (NAPROSYN) 500 MG tablet Take 1 tablet (500 mg total) by mouth 2 (two) times daily with a meal. 30 tablet 0  . palbociclib (IBRANCE) 75 MG capsule TAKE 1 CAPSULE ('75MG'$ ) BY MOUTH DAILY. TAKE WHOLE WITH FOOD. 21 capsule 2  . pantoprazole (PROTONIX) 40 MG tablet Take 1 tablet (40 mg total) by mouth daily. Take 30-60 min before first meal of the day 30 tablet 2  . promethazine (PHENERGAN) 25 MG tablet Take 1 tablet (25 mg total) by mouth every 6 (six) hours as needed for nausea or vomiting. 30 tablet 0  . ranitidine (ZANTAC) 300 MG tablet Take 1 tablet (300 mg total) by mouth at bedtime. 30 tablet 5  . zolpidem (AMBIEN) 5 MG tablet Take 1 tablet (5 mg total) by mouth at bedtime as needed for sleep. 30 tablet 0   No current facility-administered medications for this visit.     PHYSICAL EXAMINATION: ECOG PERFORMANCE STATUS: 2 - Symptomatic, <50% confined to bed  Vitals:   05/13/17 1351  BP: 126/72  Pulse: 69  Resp: 20  Temp: 98.3 F (36.8 C)  SpO2: 100%   Filed Weights   05/13/17 1351  Weight: 288 lb 6.4 oz (130.8 kg)    GENERAL:alert, no distress and comfortable SKIN: skin color, texture, turgor are normal, no rashes or significant lesions EYES: normal, Conjunctiva are pink and non-injected, sclera clear OROPHARYNX:no exudate, no erythema and lips, buccal mucosa, and tongue  normal  NECK: supple, thyroid normal size, non-tender, (+)palpable right thyroid nodule  LYMPH:  no palpable lymphadenopathy in the cervical, axillary or inguinal LUNGS: clear to auscultation and percussion with normal breathing effort HEART: regular rate & rhythm and no murmurs and no lower extremity edema ABDOMEN:abdomen soft, non-tender and normal bowel sounds (+) significant tenderness in right lateral rib cage, mild tenderness in left lateral rib, abdominal exam negative.  Musculoskeletal:no cyanosis of digits and no clubbing  NEURO: alert & oriented x 3 with fluent speech, no focal motor/sensory deficits Breast: (+) S/p left mastectomy: She has mild tenderness around the tissue expander, no palpable mass or adenopathy. right breast exam negative    LABORATORY DATA:  I have reviewed the data as listed CBC Latest Ref Rng & Units 05/13/2017 03/29/2017 02/20/2017  WBC 3.9 - 10.3 K/uL 2.7(L) 2.5(L) 2.9(L)  Hemoglobin 11.6 - 15.9 g/dL 12.0 11.8 11.9  Hematocrit 34.8 - 46.6 % 35.9 36.0 36.0  Platelets 145 - 400 K/uL 244 254 229     CMP Latest Ref Rng & Units 05/13/2017 03/29/2017 02/20/2017  Glucose 70 - 140 mg/dL 88 117 98  BUN 7 - 26 mg/dL 13 16.3 11.4  Creatinine 0.60 - 1.10 mg/dL 0.80 0.9 0.8  Sodium 136 - 145 mmol/L 142 139 142  Potassium 3.5 - 5.1 mmol/L 4.4 3.9 4.0  Chloride 98 - 109 mmol/L 111(H) - -  CO2 22 - 29  mmol/L _0 Calcium 8.4 - 10.4 mg/dL 8.8 8.9 9.0  Total Protein 6.4 - 8.3 g/dL 7.1 7.5 7.5  Total Bilirubin 0.2 - 1.2 mg/dL 0.7 0.72 1.66(H)  Alkaline Phos 40 - 150 U/L 98 109 86  AST 5 - 34 U/L _1 ALT 0 - 55 U/L 7 <6 8   PROCEDURES  ECHO 01/22/17 Study Conclusions - Left ventricle: The cavity size was normal. There was moderate concentric hypertrophy. Systolic function was normal. The estimated ejection fraction was in the range of 55% to 60%. Wall motion was normal; there were no regional wall motion abnormalities. Doppler parameters are consistent with  abnorma left ventricular relaxation (grade 1 diastolic dysfunction).   RADIOGRAPHIC STUDIES: I have personally reviewed the radiological images as listed and agreed with the findings in the report. No results found.    CT AP W Contrast 02/07/17 IMPRESSION: 1. No CT findings for abdominal/pelvic metastatic disease. 2. No acute abdominal findings, mass lesions or adenopathy. 3. Status post cholecystectomy with mild associated common bile duct dilatation. 4. Somewhat thickened appendix appears relatively stable. No acute inflammatory process. 5. Stable mixed lytic and sclerotic osseous metastatic disease.   Bone Scan Whole Body 02/07/17 IMPRESSION: No scintigraphic evidence skeletal metastasis    ASSESSMENT & PLAN:  Michaela Little is a 57 y.o. female with a history of HTN, HLD, Arthritis, Asthma, Sleep Apnea, and H. Pylori.   1. 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 -She has metastatic ER positive breast cancer to bones, has been on letrozole and Ibrance since March 2017. She has had her oncological care both in [place] and [place], her last PET scan was in December 2017 and last CT scan was in April 2018. Due to her worsening bilateral rib cage pain, I obtained restaging CT abdomen and pelvis with contrast, and bone scan on 02/07/2017. I reviewed the scan findings with pt, which showed stable lipomatosis, not hypermetabolic on bone scan, no other new lesions.  No evidence of disease progression on the restaging scan -Given her increase b/l rib cage pain, possible related to her letrozole along with her bone mets, I switched her to Exemestane (02/13/17). Potential side effects reviewed with her again, she agrees to proceed. -Dr. Ninfa Linden will do her appendectomy and Dr. Harlow Mares will do her breast reconstruction surgery on the same day. She will notify me of this date as she will  need to be off Ibrance at that time. She has an appointment scheduled for March 2019.  -Her last Delton See was 03/29/17, will proceed with injection today and continue monthly  -Continue Ibrance at 28m,  She will restart next cycle on 05/27/17 and continue exemestane daily -Labs reviewed, WBC at 2.7, otherwise her CBC and CMP are WNL. Her Ca 27.29 still pending  -f/u in 8 weeks with lab and injection, lab and injection in 4 weeks   -repeat staging scan in May 2019   2. Primary breast cancer of left breast overlapping sites diagnosed March of 2010 [facility] and (b) doxorubicin/ cyclophosphamide in dose dense fashion x4, both given with tifiparnib.  -definitive left modified radical mastectomy September of 2010 for a T2 N2 or stage IIIA invasive lobular breast cancer, estrogen and progesterone receptor positive, HER-2 negative,  -post mastectomy radiation completed January of 2011, -briefly on tamoxifen, discontinued it because of uterine  lining concerns.  On anastrozole since May 2012 with good tolerance. Held between November and December 2013 due to GI issues, unrelated to cancer or its treatment. Discontinued by the patient summer of 2016  3. Bilateral rib cage pain, Left Breast pain -Pain was 6-7/10 -She takes Tylenol and tramadol as needed -restaging scan did not show progression of bone metastasis  -02/07/17 Bone scan showed no evidence of hypermetabolic bone metastasis.  -Bone pain has increased to bilateral ribs. She does not want to take narcotics and only takes tylenol as needed. I have switched her letrozole to Exemestane. Her pain has improved lately. -She previously declined narcotics, but I suggest she continue to take tylenol, advil or alieve for her pain as we want to help her control her pain.   4. Lower Extremity Edema -She is on Lasix, given by her PCP Dr. Altamease Oiler -I suggest compression socks and advices her to elevate her  feet when sitting at home.  -Repeat echocardiogram in 12/2016 shows normal EF  5. Dyspnea  -Uses Flovent -Ordered ECHO to rule out cardiomyopathy, she had chemo Adramycin before which can cause heart failure  -01/22/17 ECHO showed normal EF 55-60%, and grade 1 diastolic dysfunction -slightly improved lately   6. Insomnia  -She is on hydroxyzine already  -I suggest her to try melatonin -She was given gabapentin in the past, I advised if it is not working for her she can stop.   7. Left breast implant rupture  -She plans to have implant replacement by Dr. Marica Otter soon  8. thickened Appendix  -This is incidental finding on her CT scan, stable since 07/2016 -Patient is very concerned, she was seen by surgeon, and plan to have appendicectomy soon with Dr. Ninfa Linden  9. Goal of care discussion  -We discussed the incurable nature of her cancer, and the overall poor prognosis, especially if she does not have good response to chemotherapy or progress on chemo -The patient understands the goal of care is palliative. -she is full code now  PLAN:  -Xgeva injection today  -Continue Ibrance, plan to start next cycle on 3/4. Will hold Ibrance for her surgery (appendectomy and left breast implant removal) when we know the date  -Continue exemestane  -Lab and injection in 4 and 8 weeks  -F/u in 8 weeks     No orders of the defined types were placed in this encounter.  All questions were answered. The patient knows to call the clinic with any problems, questions or concerns. No barriers to learning was detected.  I spent 20 minutes counseling the patient face to face. The total time spent in the appointment was 25 minutes and more than 50% was on counseling and review of test results   This document serves as a record of services personally performed by [name], MD. It was created on her behalf by Theresia Bough, a trained medical scribe. The creation of this record is based on the scribe's  personal observations and the provider's statements to them.   I have reviewed the above documentation for accuracy and completeness, and I agree with the above.    [name], MD 05/13/2017

## 2017-05-11 NOTE — Progress Notes (Signed)
Office Visit Note   Patient: Michaela Little           Date of Birth: 05/14/1960           MRN: [id number] Visit Date: 05/08/2017 Requested by: Clent Demark, PA-C [place], [place] [address] PCP: [name]  Subjective: Chief Complaint  Patient presents with  . Right Knee - Pain  . Left Knee - Pain    HPI: Michaela Little is a patient with bilateral knee pain.  She ambulates with a walker.  She describes chronic knee pain but is getting worse.  States that the pain is constant.  She has had years of symptoms.  She states that when she is walking she gets exhausted and has muscle spasms.  She has been using Flexeril and naproxen.  She is on disability due to problems in her spine and hips.  She is a cancer survivor.              ROS: All systems reviewed are negative as they relate to the chief complaint within the history of present illness.  Patient denies  fevers or chills.   Assessment & Plan: Visit Diagnoses:  1. Bilateral primary osteoarthritis of knee     Plan: Impression is bilateral knee arthritis in a patient who has multiple other medical problems.  Initial plan for this is [facility] for bilateral quad strengthening.  He does not want to consider injections today.  We will do 1 time a week for 6 weeks plus home exercise program.  Next step would be injections.  She will consider her options to see if therapy helps.  Follow-up as needed.  Follow-Up Instructions: Return if symptoms worsen or fail to improve.   Orders:  Orders Placed This Encounter  Procedures  . XR KNEE 3 VIEW RIGHT  . XR KNEE 3 VIEW LEFT  . Ambulatory referral to Physical Therapy   No orders of the defined types were placed in this encounter.     Procedures: No procedures performed   Clinical Data: No additional findings.  Objective: Vital Signs: LMP 02/25/2009   Physical Exam:   Constitutional: Patient appears well-developed HEENT:  Head:  Normocephalic Eyes:EOM are normal Neck: Normal range of motion Cardiovascular: Normal rate Pulmonary/chest: Effort normal Neurologic: Patient is alert Skin: Skin is warm Psychiatric: Patient has normal mood and affect    Ortho Exam: Orthopedic exam demonstrates flexion contracture of both knees about 5-10 degrees.  She does have flexion past 90.  Warmth or asymmetric effusion in either knee.  No groin pain with internal/external rotation of the legs.  Pedal pulses palpable.  No calf tenderness to palpation.  Tensor mechanism is intact.  Collateral and cruciate ligaments are stable bilaterally.  Specialty Comments:  No specialty comments available.  Imaging: No results found.   PMFS History: Patient Active Problem List   Diagnosis Date Noted  . Goals of care, counseling/discussion 01/20/2017  . Multinodular thyroid 02/08/2016  . Lumbar back pain with radiculopathy affecting left lower extremity 02/08/2016  . Carcinoma of breast metastatic to bone ([place]) 02/08/2016  . Bone metastases ([place]) 11/13/2015  . Right thyroid nodule 06/30/2014  . Allergic rhinitis due to pollen 06/30/2014  . Essential hypertension 12/24/2013  . Left ventricular diastolic dysfunction with preserved systolic function 70/62/3762  . Lump in neck 08/11/2013  . Abdominal pain, chronic, bilateral lower quadrant 08/05/2013  . Abdominal pain, epigastric 08/05/2013  . Morbid (severe) obesity due to excess  calories ([place]) 06/23/2013  . Allergic rhinitis 06/23/2013  . GERD (gastroesophageal reflux disease) 03/25/2013  . Constipation 03/25/2013  . Other and unspecified hyperlipidemia 03/25/2013  . Dysuria 03/25/2013  . Routine general medical examination at a health care facility 03/25/2013  . Malignant neoplasm of overlapping sites of left breast in female, estrogen receptor positive ([place]) 03/05/2013  . Cough 10/28/2012  . Dyspnea on exertion 10/28/2012  . Chronic abdominal pain 09/02/2012  . Pelvic pain in  female 02/06/2012  . Atypical chest pain 11/13/2011  . Hyperlipidemia 11/13/2011  . Dysplasia of cervix, low grade (CIN 1) 08/22/2011  . Diastolic dysfunction 32/67/1245  . Influenza 03/04/2011  . Hypertension 03/04/2011  . Obesity 03/04/2011  . OSA (obstructive sleep apnea) 02/27/2011  . Asthma 02/08/2011   Past Medical History:  Diagnosis Date  . Anxiety   . Arthritis    hands, knees, hips  . Asthma   . Cancer ([place])   . Cervical stenosis (uterine cervix)   . Dyspnea on exertion   . GERD (gastroesophageal reflux disease)   . Headache(784.0)   . History of breast cancer    2010--  LEFT  s/p  mastectomy (in [place]) AND CHEMORADIATION--  NO RECURRENCE  . History of cervical dysplasia   . History of TB skin testing    [place] TEEN--  [place] W/ MEDS  . Hyperlipidemia   . Hypertension   . Malignant neoplasm of overlapping sites of left breast in female, estrogen receptor positive ([place]) 03/05/2013  . OSA (obstructive sleep apnea) moderate osa per study  09/2010   CPAP  , NOT USING ON REGULAR BASIS  . Pelvic pain in female   . Positive H. pylori test    08-05-2013  . Refusal of blood transfusions as patient is Jehovah's Witness   . Seasonal allergies   . Uterine fibroid   . Wears glasses     Family History  Problem Relation Age of Onset  . Breast cancer Mother   . Colon cancer Mother   . Hypotension Mother   . Asthma Mother   . Diabetes type II Mother   . Arthritis Mother   . Clotting disorder Mother   . Cancer Mother        breast/colon  . Mental illness Brother   . Heart disease Brother   . Cerebral palsy Daughter   . Emphysema Brother        never smoker  . Colon cancer Maternal Aunt   . Cancer Maternal Aunt        colon  . Colon cancer Maternal Uncle   . Cancer Maternal Uncle        colon  . Esophageal cancer Neg Hx   . Rectal cancer Neg Hx   . Stomach cancer Neg Hx   . Thyroid disease Neg Hx     Past Surgical History:  Procedure Laterality Date  . CARDIOVASCULAR  STRESS TEST  10-29-2012   low risk perfusion study/  no significant reversibity/ ef 66%/  normal wall motion  . CERVICAL CONIZATION W/BX  2012   in  [place] N/A 05/14/2012   Procedure: LAPAROSCOPIC CHOLECYSTECTOMY;  Surgeon: Ralene Ok, MD;  Location: [facility];  Service: General;  Laterality: N/A;  . COLONOSCOPY  2011   normal per patient - [place]  . DILATION AND CURETTAGE OF UTERUS  10/15/2011   Procedure: DILATATION AND CURETTAGE;  Surgeon: Melina Schools, MD;  Location: [facility];  Service: Gynecology;  Laterality: N/A;  Conization &  endocervical curettings  . EXAMINATION UNDER ANESTHESIA N/A 08/20/2013   Procedure: EXAM UNDER ANESTHESIA;  Surgeon: Margarette Asal, MD;  Location: [facility];  Service: Gynecology;  Laterality: N/A;  . [place]   right  . LAPAROSCOPIC ASSISTED VAGINAL HYSTERECTOMY N/A 10/26/2014   Procedure: HYSTERECTOMY ABDOMINAL ;  Surgeon: Molli Posey, MD;  Location: [facility];  Service: Gynecology;  Laterality: N/A;  . MASTECTOMY Left 11/2008  in [place]   . SALPINGOOPHORECTOMY Bilateral 10/26/2014   Procedure: BILATERAL SALPINGO OOPHORECTOMY;  Surgeon: Molli Posey, MD;  Location: [facility];  Service: Gynecology;  Laterality: Bilateral;  . TRANSTHORACIC ECHOCARDIOGRAM  10-29-2012   mild lvh/  ef 60-65%/  grade II diastolic dysfunction/  trivial mr  &  tr   Social History   Occupational History  . Occupation: Disabled  Tobacco Use  . Smoking status: Former Smoker    Packs/day: 0.30    Years: 10.00    Pack years: 3.00    Types: Cigarettes    Last attempt to quit: 03/26/1988    Years since quitting: 29.1  . Smokeless tobacco: Never Used  Substance and Sexual Activity  . Alcohol use: No  . Drug use: No  . Sexual activity: Not Currently

## 2017-05-13 ENCOUNTER — Inpatient Hospital Stay: Payer: Medicare Other

## 2017-05-13 ENCOUNTER — Inpatient Hospital Stay: Payer: Medicare Other | Attending: Hematology

## 2017-05-13 ENCOUNTER — Encounter: Payer: Self-pay | Admitting: Hematology

## 2017-05-13 ENCOUNTER — Telehealth: Payer: Self-pay | Admitting: Hematology

## 2017-05-13 ENCOUNTER — Inpatient Hospital Stay (HOSPITAL_BASED_OUTPATIENT_CLINIC_OR_DEPARTMENT_OTHER): Payer: Medicare Other | Admitting: Hematology

## 2017-05-13 VITALS — BP 126/72 | HR 69 | Temp 98.3°F | Resp 20 | Ht 67.0 in | Wt 288.4 lb

## 2017-05-13 DIAGNOSIS — N644 Mastodynia: Secondary | ICD-10-CM | POA: Diagnosis not present

## 2017-05-13 DIAGNOSIS — R0602 Shortness of breath: Secondary | ICD-10-CM | POA: Insufficient documentation

## 2017-05-13 DIAGNOSIS — G4700 Insomnia, unspecified: Secondary | ICD-10-CM | POA: Diagnosis not present

## 2017-05-13 DIAGNOSIS — Z170 Estrogen receptor positive status [ER+]: Secondary | ICD-10-CM | POA: Insufficient documentation

## 2017-05-13 DIAGNOSIS — R0781 Pleurodynia: Secondary | ICD-10-CM | POA: Insufficient documentation

## 2017-05-13 DIAGNOSIS — K388 Other specified diseases of appendix: Secondary | ICD-10-CM | POA: Diagnosis not present

## 2017-05-13 DIAGNOSIS — Z7189 Other specified counseling: Secondary | ICD-10-CM | POA: Diagnosis not present

## 2017-05-13 DIAGNOSIS — I10 Essential (primary) hypertension: Secondary | ICD-10-CM | POA: Insufficient documentation

## 2017-05-13 DIAGNOSIS — C50812 Malignant neoplasm of overlapping sites of left female breast: Secondary | ICD-10-CM | POA: Diagnosis not present

## 2017-05-13 DIAGNOSIS — C50912 Malignant neoplasm of unspecified site of left female breast: Secondary | ICD-10-CM

## 2017-05-13 DIAGNOSIS — C7951 Secondary malignant neoplasm of bone: Secondary | ICD-10-CM

## 2017-05-13 DIAGNOSIS — R609 Edema, unspecified: Secondary | ICD-10-CM | POA: Diagnosis not present

## 2017-05-13 DIAGNOSIS — K838 Other specified diseases of biliary tract: Secondary | ICD-10-CM | POA: Diagnosis not present

## 2017-05-13 LAB — CBC WITH DIFFERENTIAL/PLATELET
Basophils Absolute: 0 10*3/uL (ref 0.0–0.1)
Basophils Relative: 1 %
EOS ABS: 0.1 10*3/uL (ref 0.0–0.5)
Eosinophils Relative: 4 %
HCT: 35.9 % (ref 34.8–46.6)
Hemoglobin: 12 g/dL (ref 11.6–15.9)
LYMPHS ABS: 1.1 10*3/uL (ref 0.9–3.3)
Lymphocytes Relative: 39 %
MCH: 30.1 pg (ref 25.1–34.0)
MCHC: 33.4 g/dL (ref 31.5–36.0)
MCV: 90 fL (ref 79.5–101.0)
MONO ABS: 0.2 10*3/uL (ref 0.1–0.9)
MONOS PCT: 8 %
Neutro Abs: 1.3 10*3/uL — ABNORMAL LOW (ref 1.5–6.5)
Neutrophils Relative %: 48 %
PLATELETS: 244 10*3/uL (ref 145–400)
RBC: 3.99 MIL/uL (ref 3.70–5.45)
RDW: 15 % — AB (ref 11.2–14.5)
WBC: 2.7 10*3/uL — AB (ref 3.9–10.3)

## 2017-05-13 LAB — COMPREHENSIVE METABOLIC PANEL
ALT: 7 U/L (ref 0–55)
AST: 12 U/L (ref 5–34)
Albumin: 3.6 g/dL (ref 3.5–5.0)
Alkaline Phosphatase: 98 U/L (ref 40–150)
Anion gap: 7 (ref 3–11)
BUN: 13 mg/dL (ref 7–26)
CHLORIDE: 111 mmol/L — AB (ref 98–109)
CO2: 24 mmol/L (ref 22–29)
Calcium: 8.8 mg/dL (ref 8.4–10.4)
Creatinine, Ser: 0.8 mg/dL (ref 0.60–1.10)
GFR calc Af Amer: >60 mL/min (ref >60)
Glucose, Bld: 88 mg/dL (ref 70–140)
Potassium: 4.4 mmol/L (ref 3.5–5.1)
Sodium: 142 mmol/L (ref 136–145)
Total Bilirubin: 0.7 mg/dL (ref 0.2–1.2)
Total Protein: 7.1 g/dL (ref 6.4–8.3)

## 2017-05-13 MED ORDER — DENOSUMAB 120 MG/1.7ML ~~LOC~~ SOLN
120.0000 mg | Freq: Once | SUBCUTANEOUS | Status: AC
  Administered 2017-05-13: 120 mg via SUBCUTANEOUS

## 2017-05-13 MED ORDER — PALBOCICLIB 75 MG PO CAPS
ORAL_CAPSULE | ORAL | 2 refills | Status: DC
Start: 2017-05-13 — End: 2017-05-24

## 2017-05-13 MED ORDER — DENOSUMAB 120 MG/1.7ML ~~LOC~~ SOLN
SUBCUTANEOUS | Status: AC
  Filled 2017-05-13: qty 1.7

## 2017-05-13 NOTE — Patient Instructions (Signed)
Denosumab injection  What is this medicine?  DENOSUMAB (den oh sue mab) slows bone breakdown. Prolia is used to treat osteoporosis in women after menopause and in men. Xgeva is used to prevent bone fractures and other bone problems caused by cancer bone metastases. Xgeva is also used to treat giant cell tumor of the bone.  This medicine may be used for other purposes; ask your health care provider or pharmacist if you have questions.  What should I tell my health care provider before I take this medicine?  They need to know if you have any of these conditions:  -dental disease  -eczema  -infection or history of infections  -kidney disease or on dialysis  -low blood calcium or vitamin D  -malabsorption syndrome  -scheduled to have surgery or tooth extraction  -taking medicine that contains denosumab  -thyroid or parathyroid disease  -an unusual reaction to denosumab, other medicines, foods, dyes, or preservatives  -pregnant or trying to get pregnant  -breast-feeding  How should I use this medicine?  This medicine is for injection under the skin. It is given by a health care professional in a hospital or clinic setting.  If you are getting Prolia, a special MedGuide will be given to you by the pharmacist with each prescription and refill. Be sure to read this information carefully each time.  For Prolia, talk to your pediatrician regarding the use of this medicine in children. Special care may be needed. For Xgeva, talk to your pediatrician regarding the use of this medicine in children. While this drug may be prescribed for children as young as 13 years for selected conditions, precautions do apply.  Overdosage: If you think you have taken too much of this medicine contact a poison control center or emergency room at once.  NOTE: This medicine is only for you. Do not share this medicine with others.  What if I miss a dose?  It is important not to miss your dose. Call your doctor or health care professional if you are  unable to keep an appointment.  What may interact with this medicine?  Do not take this medicine with any of the following medications:  -other medicines containing denosumab  This medicine may also interact with the following medications:  -medicines that suppress the immune system  -medicines that treat cancer  -steroid medicines like prednisone or cortisone  This list may not describe all possible interactions. Give your health care provider a list of all the medicines, herbs, non-prescription drugs, or dietary supplements you use. Also tell them if you smoke, drink alcohol, or use illegal drugs. Some items may interact with your medicine.  What should I watch for while using this medicine?  Visit your doctor or health care professional for regular checks on your progress. Your doctor or health care professional may order blood tests and other tests to see how you are doing.  Call your doctor or health care professional if you get a cold or other infection while receiving this medicine. Do not treat yourself. This medicine may decrease your body's ability to fight infection.  You should make sure you get enough calcium and vitamin D while you are taking this medicine, unless your doctor tells you not to. Discuss the foods you eat and the vitamins you take with your health care professional.  See your dentist regularly. Brush and floss your teeth as directed. Before you have any dental work done, tell your dentist you are receiving this medicine.  Do   not become pregnant while taking this medicine or for 5 months after stopping it. Women should inform their doctor if they wish to become pregnant or think they might be pregnant. There is a potential for serious side effects to an unborn child. Talk to your health care professional or pharmacist for more information.  What side effects may I notice from receiving this medicine?  Side effects that you should report to your doctor or health care professional as soon as  possible:  -allergic reactions like skin rash, itching or hives, swelling of the face, lips, or tongue  -breathing problems  -chest pain  -fast, irregular heartbeat  -feeling faint or lightheaded, falls  -fever, chills, or any other sign of infection  -muscle spasms, tightening, or twitches  -numbness or tingling  -skin blisters or bumps, or is dry, peels, or red  -slow healing or unexplained pain in the mouth or jaw  -unusual bleeding or bruising  Side effects that usually do not require medical attention (Report these to your doctor or health care professional if they continue or are bothersome.):  -muscle pain  -stomach upset, gas  This list may not describe all possible side effects. Call your doctor for medical advice about side effects. You may report side effects to FDA at 1-800-FDA-1088.  Where should I keep my medicine?  This medicine is only given in a clinic, doctor's office, or other health care setting and will not be stored at home.  NOTE: This sheet is a summary. It may not cover all possible information. If you have questions about this medicine, talk to your doctor, pharmacist, or health care provider.      2016, [employer]vier/Gold Standard. (2011-09-10 12:37:47)

## 2017-05-13 NOTE — Telephone Encounter (Signed)
Appointment scheduled AVS/Calendar printed per 2/18 los °

## 2017-05-14 LAB — CANCER ANTIGEN 27.29: CAN 27.29: 21.1 U/mL (ref 0.0–38.6)

## 2017-05-20 ENCOUNTER — Telehealth: Payer: Self-pay | Admitting: Hematology

## 2017-05-20 NOTE — Telephone Encounter (Signed)
Appointment for 3/1 cancelled per 2/25 sch msg patient has been notified also.

## 2017-05-21 ENCOUNTER — Ambulatory Visit: Payer: Medicare Other | Admitting: Allergy and Immunology

## 2017-05-22 ENCOUNTER — Encounter: Payer: Self-pay | Admitting: Physical Therapy

## 2017-05-22 ENCOUNTER — Other Ambulatory Visit: Payer: Self-pay

## 2017-05-22 ENCOUNTER — Ambulatory Visit: Payer: Medicare Other | Attending: Orthopedic Surgery | Admitting: Physical Therapy

## 2017-05-22 ENCOUNTER — Ambulatory Visit: Payer: Medicare Other | Admitting: Physical Therapy

## 2017-05-22 DIAGNOSIS — M25562 Pain in left knee: Secondary | ICD-10-CM | POA: Insufficient documentation

## 2017-05-22 DIAGNOSIS — M25561 Pain in right knee: Secondary | ICD-10-CM | POA: Diagnosis not present

## 2017-05-22 DIAGNOSIS — R262 Difficulty in walking, not elsewhere classified: Secondary | ICD-10-CM | POA: Insufficient documentation

## 2017-05-22 DIAGNOSIS — M545 Low back pain, unspecified: Secondary | ICD-10-CM

## 2017-05-22 DIAGNOSIS — G8929 Other chronic pain: Secondary | ICD-10-CM | POA: Diagnosis not present

## 2017-05-22 NOTE — Therapy (Signed)
[place], [place], [address] Phone: [phone]   Fax:  [phone]  Physical Therapy Evaluation  Patient Details  Name: Michaela Little MRN: [id number] Date of Birth: 1961/02/11 Referring Provider: Dr Meredith Pel    Encounter Date: 05/22/2017  PT End of Session - 05/22/17 1545    Visit Number  1    Number of Visits  12    Date for PT Re-Evaluation  07/03/17    Authorization Type  medicare     PT Start Time  6962    PT Stop Time  1229    PT Time Calculation (min)  44 min    Activity Tolerance  Patient tolerated treatment well    Behavior During Therapy  [facility] for tasks assessed/performed       Past Medical History:  Diagnosis Date  . Anxiety   . Arthritis    hands, knees, hips  . Asthma   . Cancer ([place])   . Cervical stenosis (uterine cervix)   . Dyspnea on exertion   . GERD (gastroesophageal reflux disease)   . Headache(784.0)   . History of breast cancer    2010--  LEFT  s/p  mastectomy (in [place]) AND CHEMORADIATION--  NO RECURRENCE  . History of cervical dysplasia   . History of TB skin testing    [place] TEEN--  [place] W/ MEDS  . Hyperlipidemia   . Hypertension   . Malignant neoplasm of overlapping sites of left breast in female, estrogen receptor positive ([place]) 03/05/2013  . OSA (obstructive sleep apnea) moderate osa per study  09/2010   CPAP  , NOT USING ON REGULAR BASIS  . Pelvic pain in female   . Positive H. pylori test    08-05-2013  . Refusal of blood transfusions as patient is Jehovah's Witness   . Seasonal allergies   . Uterine fibroid   . Wears glasses     Past Surgical History:  Procedure Laterality Date  . CARDIOVASCULAR STRESS TEST  10-29-2012   low risk perfusion study/  no significant reversibity/ ef 66%/  normal wall motion  . CERVICAL CONIZATION W/BX  2012   in  [place] N/A 05/14/2012   Procedure: LAPAROSCOPIC CHOLECYSTECTOMY;  Surgeon: Ralene Ok,  MD;  Location: [facility];  Service: General;  Laterality: N/A;  . COLONOSCOPY  2011   normal per [place] - [place]  . DILATION AND CURETTAGE OF UTERUS  10/15/2011   Procedure: DILATATION AND CURETTAGE;  Surgeon: Melina Schools, MD;  Location: [facility];  Service: Gynecology;  Laterality: N/A;  Conization &  endocervical curettings  . EXAMINATION UNDER ANESTHESIA N/A 08/20/2013   Procedure: EXAM UNDER ANESTHESIA;  Surgeon: Margarette Asal, MD;  Location: [facility];  Service: Gynecology;  Laterality: N/A;  . [place]   right  . LAPAROSCOPIC ASSISTED VAGINAL HYSTERECTOMY N/A 10/26/2014   Procedure: HYSTERECTOMY ABDOMINAL ;  Surgeon: Molli Posey, MD;  Location: [facility];  Service: Gynecology;  Laterality: N/A;  . MASTECTOMY Left 11/2008  in [place]   . SALPINGOOPHORECTOMY Bilateral 10/26/2014   Procedure: BILATERAL SALPINGO OOPHORECTOMY;  Surgeon: Molli Posey, MD;  Location: [facility];  Service: Gynecology;  Laterality: Bilateral;  . TRANSTHORACIC ECHOCARDIOGRAM  10-29-2012   mild lvh/  ef 60-65%/  grade II diastolic dysfunction/  trivial mr  &  tr    There were no vitals filed for this visit.  Subjective Assessment - 05/22/17 1149    Subjective  Patient had a long histroy of bilateral knee pain L > R. She was also recently disanosed with bone mets in her ribs, spine and bilateral femurs ( per patient).. She is currently being treated with medications. She is currently using the walker for primary ambualtion. She feels unsteady without it. She has had falls but not recently.     Limitations  Standing;Walking    How long can you sit comfortably?  patient can no tbend her knee all the way back in sitting     How long can you stand comfortably?  Unsure how long she can stand for    How long can you walk comfortably?  Can walk around the grocery store but needs her walker     Diagnostic tests  X-ray:     Currently in Pain?  Yes    Pain Score  5     Pain  Orientation  Left    Pain Descriptors / Indicators  Pressure    Pain Type  Chronic pain    Pain Onset  More than a month ago    Pain Frequency  Constant    Aggravating Factors   Standing, walking, sitting    Pain Relieving Factors  rest     Effect of Pain on Daily Activities  difficulty perfroming ADL's     Multiple Pain Sites  Yes    Pain Score  5    Pain Location  Knee    Pain Orientation  Right    Pain Type  Chronic pain    Pain Onset  More than a month ago    Pain Frequency  Constant    Aggravating Factors   Standing and walking     Pain Relieving Factors  rest     Effect of Pain on Daily Activities  difficulty perfroming ADL's          [facility] PT Assessment - 05/22/17 0001      Assessment   Medical Diagnosis  Low Back and Bilateral Knee Pain     Referring Provider  Dr Meredith Pel     Onset Date/Surgical Date  -- Chronic pain for several years     Hand Dominance  Right    Next MD Visit  6 weeks     Prior Therapy  None       Precautions   Precautions  Fall    Precaution Comments  Patient also has active cancer       Restrictions   Weight Bearing Restrictions  No      Balance Screen   Has the patient fallen in the past 6 months  No Has fallen in the past but not in several months     Has the patient had a decrease in activity level because of a fear of falling?   No    Is the patient reluctant to leave their home because of a fear of falling?   No      Home Environment   Additional Comments  No steps into her house       Prior Function   Level of Independence  Independent    Vocation  On disability      Cognition   Overall Cognitive Status  Within Functional Limits for tasks assessed    Attention  Focused    Focused Attention  Appears intact    Memory  Appears intact    Awareness  Appears intact  Problem Solving  Appears intact      Sensation   Light Touch  Appears Intact    Additional Comments  denies parathesias in legs. Her hands get numb        Coordination   Gross Motor Movements are Fluid and Coordinated  Yes    Fine Motor Movements are Fluid and Coordinated  Yes      ROM / Strength   AROM / PROM / Strength  AROM;PROM;Strength      AROM   AROM Assessment Site  Knee      PROM   PROM Assessment Site  Knee    Right/Left Knee  Right;Left    Right Knee Extension  0    Right Knee Flexion  92    Left Knee Extension  0    Left Knee Flexion  93      Strength   Strength Assessment Site  Hip;Knee    Right/Left Hip  Right;Left    Right Hip Flexion  3+/5    Right Hip ABduction  4/5    Right Hip ADduction  4+/5    Left Hip Flexion  3+/5    Left Hip ABduction  4/5    Left Hip ADduction  4+/5      Palpation   Palpation comment  tenderness to palpation in bilateral knees L > R       Transfers   Comments  Slow trafer from sit to stand       Ambulation/Gait   Gait Comments  decreased hip flexion'; decreased single leg stance time; decreased hip rotation bilateral; increased lateral motion;              Objective measurements completed on examination: See above findings.      [employer] Adult PT Treatment/Exercise - 05/22/17 0001      Exercises   Exercises  Knee/Hip      Knee/Hip Exercises: Stretches   Other Knee/Hip Stretches  gentle heel slide x10       Knee/Hip Exercises: Seated   Other Seated Knee/Hip Exercises  seated hip abduction 2x10       Knee/Hip Exercises: Supine   Quad Sets Limitations  with lgut set x5 all exercises limited 2nd to report of chest tightness.              PT Education - 05/22/17 1331    Education provided  Yes    Person(s) Educated  Patient    Methods  Explanation;Demonstration;Tactile cues;Verbal cues    Comprehension  Verbalized understanding;Returned demonstration;Verbal cues required;Tactile cues required       PT Short Term Goals - 05/22/17 1344      PT SHORT TERM GOAL #1   Title  Patient will increase bilateral knee flexion by 10 degrees     Time  3    Period   Weeks    Status  New    Target Date  06/19/17      PT SHORT TERM GOAL #2   Title  Patientwill report 3/10 pain at worst in her knees     Target Date  06/12/17      PT SHORT TERM GOAL #3   Title  Patient will increase gross bilateral hip and knee strength to 4+/5    Time  3    Period  Weeks    Status  New    Target Date  06/12/17        PT Long Term Goals - 05/22/17 1542  PT LONG TERM GOAL #1   Title  Patient will stand for 20 minutes without slef reported increased pain in order to perfrom ADL's     Time  6    Period  Weeks    Status  New    Target Date  07/03/17      PT LONG TERM GOAL #2   Title  Patient will ambualte 2000' without increased pain in order to go shopping     Time  6    Period  Weeks    Status  New    Target Date  07/03/17      PT LONG TERM GOAL #3   Title  Patient will demosntrate a 51% limitation on FOTO     Time  6    Period  Weeks    Status  New    Target Date  07/03/17             Plan - 05/22/17 1213    Clinical Impression Statement  Patient presnets with bilateral knee and lower back pain. She has limited knee flexion ROm and limited hip and knee strength. She has increased pain with standing and walking. She has multiple bone mets. She was advised to follow her symptms closely and not put her into too much pain. She was seen for a moderate complexity eval. Patient began to experience chest pressure during evaluation. Vitals were measured at B/P 138/80 HR 67. She reports this has been hapening freqenlty over the past few days but it goes away. She had no other symptoms. She was advised to call her MD as soon as she leaves PT. She reports she will contact her MD. Her tightness improved with time. Patient advised not to exercise until she talks to her MD about chest tightness.      History and Personal Factors relevant to plan of care:  Cancer with metastisis; Heart dysfucntion; Obesity    Clinical Presentation  Evolving    Clinical  Decision Making  High    Rehab Potential  Good    PT Frequency  2x / week    PT Duration  8 weeks    PT Treatment/Interventions  ADLs/Self Care Home Management;Cryotherapy;Electrical Stimulation;Iontophoresis 4mg /ml Dexamethasone;Therapeutic exercise;Therapeutic activities;Stair training;Gait training;Neuromuscular re-education;Patient/family education    Consulted and Agree with Plan of Care  Patient       Patient will benefit from skilled therapeutic intervention in order to improve the following deficits and impairments:  Abnormal gait, Pain, Decreased endurance, Decreased activity tolerance, Decreased range of motion, Decreased strength, Impaired perceived functional ability, Impaired UE functional use, Decreased mobility, Postural dysfunction  Visit Diagnosis: Chronic pain of left knee  Chronic pain of right knee  Chronic bilateral low back pain without sciatica  Difficulty in walking, not elsewhere classified     Problem List Patient Active Problem List   Diagnosis Date Noted  . Goals of care, counseling/discussion 01/20/2017  . Multinodular thyroid 02/08/2016  . Lumbar back pain with radiculopathy affecting left lower extremity 02/08/2016  . Carcinoma of breast metastatic to bone ([place]) 02/08/2016  . Bone metastases ([place]) 11/13/2015  . Right thyroid nodule 06/30/2014  . Allergic rhinitis due to pollen 06/30/2014  . Essential hypertension 12/24/2013  . Left ventricular diastolic dysfunction with preserved systolic function 99/37/1696  . Lump in neck 08/11/2013  . Abdominal pain, chronic, bilateral lower quadrant 08/05/2013  . Abdominal pain, epigastric 08/05/2013  . Morbid (severe) obesity due to excess calories ([place]) 06/23/2013  . Allergic rhinitis 06/23/2013  .  GERD (gastroesophageal reflux disease) 03/25/2013  . Constipation 03/25/2013  . Other and unspecified hyperlipidemia 03/25/2013  . Dysuria 03/25/2013  . Routine general medical examination at a health care  facility 03/25/2013  . Malignant neoplasm of overlapping sites of left breast in female, estrogen receptor positive ([place]) 03/05/2013  . Cough 10/28/2012  . Dyspnea on exertion 10/28/2012  . Chronic abdominal pain 09/02/2012  . Pelvic pain in female 02/06/2012  . Atypical chest pain 11/13/2011  . Hyperlipidemia 11/13/2011  . Dysplasia of cervix, low grade (CIN 1) 08/22/2011  . Diastolic dysfunction 13/24/4010  . Influenza 03/04/2011  . Hypertension 03/04/2011  . Obesity 03/04/2011  . OSA (obstructive sleep apnea) 02/27/2011  . Asthma 02/08/2011    Carney Living PT DPT  05/22/2017, 3:46 PM  [facility] [address] [place], [place], [address] Phone: [phone]   Fax:  [phone]  Name: Michaela Little MRN: [id number] Date of Birth: 1960-06-24

## 2017-05-22 NOTE — Addendum Note (Signed)
Addended by: Carney Living on: 05/22/2017 03:51 PM   Modules accepted: Orders

## 2017-05-23 ENCOUNTER — Other Ambulatory Visit: Payer: Self-pay | Admitting: Hematology

## 2017-05-23 DIAGNOSIS — C7951 Secondary malignant neoplasm of bone: Principal | ICD-10-CM

## 2017-05-23 DIAGNOSIS — C50912 Malignant neoplasm of unspecified site of left female breast: Secondary | ICD-10-CM

## 2017-05-24 ENCOUNTER — Ambulatory Visit: Payer: Medicare Other | Admitting: Hematology

## 2017-05-24 ENCOUNTER — Other Ambulatory Visit: Payer: Medicare Other

## 2017-05-24 ENCOUNTER — Other Ambulatory Visit: Payer: Self-pay

## 2017-05-24 ENCOUNTER — Ambulatory Visit: Payer: Medicare Other

## 2017-05-24 DIAGNOSIS — C50912 Malignant neoplasm of unspecified site of left female breast: Secondary | ICD-10-CM

## 2017-05-24 DIAGNOSIS — C7951 Secondary malignant neoplasm of bone: Principal | ICD-10-CM

## 2017-05-24 MED ORDER — PALBOCICLIB 75 MG PO CAPS: ORAL_CAPSULE | ORAL | 2 refills | Status: DC

## 2017-05-28 ENCOUNTER — Other Ambulatory Visit: Payer: Self-pay | Admitting: Surgery

## 2017-05-28 NOTE — Telephone Encounter (Signed)
FWD to PCP. Tempestt S Roberts, CMA  

## 2017-05-31 ENCOUNTER — Encounter: Payer: Self-pay | Admitting: Physical Therapy

## 2017-05-31 ENCOUNTER — Ambulatory Visit: Payer: Medicare Other | Attending: Orthopedic Surgery | Admitting: Physical Therapy

## 2017-05-31 DIAGNOSIS — M545 Low back pain, unspecified: Secondary | ICD-10-CM

## 2017-05-31 DIAGNOSIS — M25561 Pain in right knee: Secondary | ICD-10-CM | POA: Insufficient documentation

## 2017-05-31 DIAGNOSIS — R262 Difficulty in walking, not elsewhere classified: Secondary | ICD-10-CM

## 2017-05-31 DIAGNOSIS — G8929 Other chronic pain: Secondary | ICD-10-CM | POA: Insufficient documentation

## 2017-05-31 DIAGNOSIS — M25562 Pain in left knee: Secondary | ICD-10-CM | POA: Insufficient documentation

## 2017-06-03 ENCOUNTER — Encounter: Payer: Self-pay | Admitting: Physical Therapy

## 2017-06-03 ENCOUNTER — Other Ambulatory Visit: Payer: Self-pay | Admitting: Medical

## 2017-06-03 ENCOUNTER — Telehealth: Payer: Self-pay | Admitting: *Deleted

## 2017-06-03 NOTE — Therapy (Addendum)
[place], [place], [address] Phone: [phone]   Fax:  [phone]  Physical Therapy Treatment  Patient Details  Name: Michaela Little MRN: [id number] Date of Birth: 1961/01/09 Referring Provider: Dr Meredith Pel    Encounter Date: 05/31/2017  PT End of Session - 06/03/17 0810    Visit Number  2    Number of Visits  12    Date for PT Re-Evaluation  07/03/17    Authorization Type  medicare     PT Start Time  1014    PT Stop Time  1052    PT Time Calculation (min)  38 min    Activity Tolerance  Patient tolerated treatment well    Behavior During Therapy  [facility] for tasks assessed/performed       Past Medical History:  Diagnosis Date  . Anxiety   . Arthritis    hands, knees, hips  . Asthma   . Cancer ([place])   . Cervical stenosis (uterine cervix)   . Dyspnea on exertion   . GERD (gastroesophageal reflux disease)   . Headache(784.0)   . History of breast cancer    2010--  LEFT  s/p  mastectomy (in [place]) AND CHEMORADIATION--  NO RECURRENCE  . History of cervical dysplasia   . History of TB skin testing    AS TEEN--  [place] W/ MEDS  . Hyperlipidemia   . Hypertension   . Malignant neoplasm of overlapping sites of left breast in female, estrogen receptor positive ([place]) 03/05/2013  . OSA (obstructive sleep apnea) moderate osa per study  09/2010   CPAP  , NOT USING ON REGULAR BASIS  . Pelvic pain in female   . Positive H. pylori test    08-05-2013  . Refusal of blood transfusions as patient is Jehovah's Witness   . Seasonal allergies   . Uterine fibroid   . Wears glasses     Past Surgical History:  Procedure Laterality Date  . CARDIOVASCULAR STRESS TEST  10-29-2012   low risk perfusion study/  no significant reversibity/ ef 66%/  normal wall motion  . CERVICAL CONIZATION W/BX  2012   in  [place] N/A 05/14/2012   Procedure: LAPAROSCOPIC CHOLECYSTECTOMY;  Surgeon: Ralene Ok, MD;   Location: [place];  Service: General;  Laterality: N/A;  . COLONOSCOPY  2011   normal per patient - [place]  . DILATION AND CURETTAGE OF UTERUS  10/15/2011   Procedure: DILATATION AND CURETTAGE;  Surgeon: Melina Schools, MD;  Location: [facility];  Service: Gynecology;  Laterality: N/A;  Conization &  endocervical curettings  . EXAMINATION UNDER ANESTHESIA N/A 08/20/2013   Procedure: EXAM UNDER ANESTHESIA;  Surgeon: Margarette Asal, MD;  Location: [facility];  Service: Gynecology;  Laterality: N/A;  . [place]   right  . LAPAROSCOPIC ASSISTED VAGINAL HYSTERECTOMY N/A 10/26/2014   Procedure: HYSTERECTOMY ABDOMINAL ;  Surgeon: Molli Posey, MD;  Location: [facility];  Service: Gynecology;  Laterality: N/A;  . MASTECTOMY Left 11/2008  in [place]   . SALPINGOOPHORECTOMY Bilateral 10/26/2014   Procedure: BILATERAL SALPINGO OOPHORECTOMY;  Surgeon: Molli Posey, MD;  Location: [facility];  Service: Gynecology;  Laterality: Bilateral;  . TRANSTHORACIC ECHOCARDIOGRAM  10-29-2012   mild lvh/  ef 60-65%/  grade II diastolic dysfunction/  trivial mr  &  tr    There were no vitals filed for this visit.  Subjective  Assessment - 06/03/17 0807    Subjective  The patient reports no increase in pain with her exercises. She has had pain in her right groin.     Limitations  Standing;Walking    How long can you sit comfortably?  patient can no tbend her knee all the way back in sitting     How long can you stand comfortably?  Unsure how long she can stand for    How long can you walk comfortably?  Can walk around the grocery store but needs her walker     Currently in Pain?  Yes    Pain Score  6     Pain Location  Knee    Pain Orientation  Left    Pain Descriptors / Indicators  Aching    Pain Type  Chronic pain    Pain Onset  More than a month ago    Pain Frequency  Constant    Aggravating Factors   stsnding, walking, sitting     Pain Relieving Factors  rest     Effect of  Pain on Daily Activities  difficulty perfroming ADL;s     Pain Score  6    Pain Location  Knee    Pain Orientation  Right    Pain Descriptors / Indicators  Aching    Pain Type  Chronic pain    Pain Onset  More than a month ago    Pain Frequency  Constant    Aggravating Factors   standing and walking     Pain Relieving Factors  rest     Effect of Pain on Daily Activities  difficulty walking for long distances                       [facility] Adult PT Treatment/Exercise - 06/03/17 0001      Knee/Hip Exercises: Stretches   Other Knee/Hip Stretches  gentle heel slide x10       Knee/Hip Exercises: Seated   Other Seated Knee/Hip Exercises  seated ball squeeze 2x10; seated heel raise 3x10     Other Seated Knee/Hip Exercises  seated hip abduction 2x10       Knee/Hip Exercises: Supine   Quad Sets Limitations  x10  5second hold     Short Arc Quad Sets  2 sets;10 reps    Other Supine Knee/Hip Exercises  supine hip flexion x10              PT Education - 06/03/17 0810    Education provided  Yes    Education Details  reviewed technique with ther-ex     Person(s) Educated  Patient    Methods  Explanation    Comprehension  Verbalized understanding;Returned demonstration;Verbal cues required       PT Short Term Goals - 05/22/17 1344      PT SHORT TERM GOAL #1   Title  Patient will increase bilateral knee flexion by 10 degrees     Time  3    Period  Weeks    Status  New    Target Date  06/19/17      PT SHORT TERM GOAL #2   Title  Patientwill report 3/10 pain at worst in her knees     Target Date  06/12/17      PT SHORT TERM GOAL #3   Title  Patient will increase gross bilateral hip and knee strength to 4+/5    Time  3    Period  Weeks    Status  New    Target Date  06/12/17        PT Long Term Goals - 05/22/17 1542      PT LONG TERM GOAL #1   Title  Patient will stand for 20 minutes without slef reported increased pain in order to perfrom ADL's     Time   6    Period  Weeks    Status  New    Target Date  07/03/17      PT LONG TERM GOAL #2   Title  Patient will ambualte 2000' without increased pain in order to go shopping     Time  6    Period  Weeks    Status  New    Target Date  07/03/17      PT LONG TERM GOAL #3   Title  Patient will demosntrate a 51% limitation on FOTO     Time  6    Period  Weeks    Status  New    Target Date  07/03/17            Plan - 06/03/17 0813    Clinical Impression Statement  Patient tolerated exercisses well but hadd some increase in pain in her right groin with light exercise.s she reports bone mets in her groin. It is concerning that light exercises are causing her increased pain. She was advised not to put herself in pain at home. She had no lasting increase in pain at the end of treatment.     Rehab Potential  Good    PT Frequency  2x / week    PT Duration  8 weeks    PT Treatment/Interventions  ADLs/Self Care Home Management;Cryotherapy;Electrical Stimulation;Iontophoresis 25m/ml Dexamethasone;Therapeutic exercise;Therapeutic activities;Stair training;Gait training;Neuromuscular re-education;Patient/family education    PT Next Visit Plan  Continue with light exercises. Advance to standing exercises of able but may not be able, continue with Nu-step for endurance     PT Home Exercise Plan  qaud sets, heel slides, ball squeeze, hip abduction     Consulted and Agree with Plan of Care  Patient       Patient will benefit from skilled therapeutic intervention in order to improve the following deficits and impairments:  Abnormal gait, Pain, Decreased endurance, Decreased activity tolerance, Decreased range of motion, Decreased strength, Impaired perceived functional ability, Impaired UE functional use, Decreased mobility, Postural dysfunction  Visit Diagnosis: Chronic pain of left knee  Chronic pain of right knee  Difficulty in walking, not elsewhere classified  Chronic bilateral low back  pain without sciatica  PHYSICAL THERAPY DISCHARGE SUMMARY  Visits from Start of Care:2   Current functional level related to goals / functional outcomes: Did not return after las visit    Remaining deficits: Unknown    Education / Equipment: Unknown   Plan: Patient agrees to discharge.  Patient goals were not met. Patient is being discharged due to meeting the stated rehab goals.  ?????       Problem List Patient Active Problem List   Diagnosis Date Noted  . Goals of care, counseling/discussion 01/20/2017  . Multinodular thyroid 02/08/2016  . Lumbar back pain with radiculopathy affecting left lower extremity 02/08/2016  . [place] 02/08/2016  . [place] 11/13/2015  . Right thyroid nodule 06/30/2014  . Allergic rhinitis due to pollen 06/30/2014  . Essential hypertension 12/24/2013  . Left ventricular diastolic dysfunction with preserved systolic function 093/90/3009 .  Lump in neck 08/11/2013  . Abdominal pain, chronic, bilateral lower quadrant 08/05/2013  . Abdominal pain, epigastric 08/05/2013  . Morbid (severe) obesity due to excess calories ([place]) 06/23/2013  . Allergic rhinitis 06/23/2013  . GERD (gastroesophageal reflux disease) 03/25/2013  . Constipation 03/25/2013  . Other and unspecified hyperlipidemia 03/25/2013  . Dysuria 03/25/2013  . Routine general medical examination at a health care facility 03/25/2013  . Malignant neoplasm of overlapping sites of left breast in female, estrogen receptor positive ([place]) 03/05/2013  . Cough 10/28/2012  . Dyspnea on exertion 10/28/2012  . Chronic abdominal pain 09/02/2012  . Pelvic pain in female 02/06/2012  . Atypical chest pain 11/13/2011  . Hyperlipidemia 11/13/2011  . Dysplasia of cervix, low grade (CIN 1) 08/22/2011  . Diastolic dysfunction 81/44/8185  . Influenza 03/04/2011  . Hypertension 03/04/2011  . Obesity 03/04/2011  . OSA (obstructive sleep apnea) 02/27/2011   . Asthma 02/08/2011    Carney Living PT DPT  06/03/2017, 8:23 AM  [facility] [address] [place], [place], [address] Phone: [phone]   Fax:  [phone]  Name: Michaela Little MRN: [id number] Date of Birth: 04/11/1960

## 2017-06-03 NOTE — Telephone Encounter (Signed)
Received vm call from pt stating that she is having surg on 07/11/17.  Discussed with Dr Burr Medico & she suggested she stop her [name] now & call her 1 wk after surgery.  Pt appreciated call & will keep appt on 3/18 for Lab/Exgeva.

## 2017-06-07 ENCOUNTER — Ambulatory Visit: Payer: Medicare Other | Admitting: Physical Therapy

## 2017-06-07 ENCOUNTER — Other Ambulatory Visit: Payer: Self-pay | Admitting: Allergy and Immunology

## 2017-06-10 ENCOUNTER — Inpatient Hospital Stay: Payer: Medicare Other | Attending: Hematology

## 2017-06-10 ENCOUNTER — Inpatient Hospital Stay: Payer: Medicare Other

## 2017-06-10 VITALS — BP 135/69 | HR 72 | Temp 97.8°F | Resp 18

## 2017-06-10 DIAGNOSIS — C7951 Secondary malignant neoplasm of bone: Secondary | ICD-10-CM | POA: Diagnosis present

## 2017-06-10 DIAGNOSIS — C50912 Malignant neoplasm of unspecified site of left female breast: Secondary | ICD-10-CM

## 2017-06-10 DIAGNOSIS — C50812 Malignant neoplasm of overlapping sites of left female breast: Secondary | ICD-10-CM

## 2017-06-10 DIAGNOSIS — Z170 Estrogen receptor positive status [ER+]: Secondary | ICD-10-CM

## 2017-06-10 LAB — CBC WITH DIFFERENTIAL/PLATELET
BASOS PCT: 1 %
Basophils Absolute: 0 10*3/uL (ref 0.0–0.1)
EOS ABS: 0.1 10*3/uL (ref 0.0–0.5)
EOS PCT: 5 %
HCT: 34.5 % — ABNORMAL LOW (ref 34.8–46.6)
Hemoglobin: 11.7 g/dL (ref 11.6–15.9)
LYMPHS ABS: 0.9 10*3/uL (ref 0.9–3.3)
Lymphocytes Relative: 30 %
MCH: 30.3 pg (ref 25.1–34.0)
MCHC: 33.9 g/dL (ref 31.5–36.0)
MCV: 89.4 fL (ref 79.5–101.0)
MONO ABS: 0.3 10*3/uL (ref 0.1–0.9)
MONOS PCT: 9 %
NEUTROS PCT: 55 %
Neutro Abs: 1.6 10*3/uL (ref 1.5–6.5)
PLATELETS: 250 10*3/uL (ref 145–400)
RBC: 3.86 MIL/uL (ref 3.70–5.45)
RDW: 14.8 % — AB (ref 11.2–14.5)
WBC: 2.9 10*3/uL — ABNORMAL LOW (ref 3.9–10.3)

## 2017-06-10 LAB — COMPREHENSIVE METABOLIC PANEL
ALK PHOS: 81 U/L (ref 40–150)
ALT: 8 U/L (ref 0–55)
AST: 13 U/L (ref 5–34)
Albumin: 3.5 g/dL (ref 3.5–5.0)
Anion gap: 6 (ref 3–11)
BUN: 14 mg/dL (ref 7–26)
CALCIUM: 9.2 mg/dL (ref 8.4–10.4)
CHLORIDE: 109 mmol/L (ref 98–109)
CO2: 25 mmol/L (ref 22–29)
CREATININE: 0.77 mg/dL (ref 0.60–1.10)
GFR calc non Af Amer: >60 mL/min (ref >60)
Glucose, Bld: 152 mg/dL — ABNORMAL HIGH (ref 70–140)
Potassium: 3.8 mmol/L (ref 3.5–5.1)
SODIUM: 140 mmol/L (ref 136–145)
Total Bilirubin: 1 mg/dL (ref 0.2–1.2)
Total Protein: 7 g/dL (ref 6.4–8.3)

## 2017-06-10 MED ORDER — DENOSUMAB 120 MG/1.7ML ~~LOC~~ SOLN
SUBCUTANEOUS | Status: AC
  Filled 2017-06-10: qty 1.7

## 2017-06-10 MED ORDER — DENOSUMAB 120 MG/1.7ML ~~LOC~~ SOLN
120.0000 mg | Freq: Once | SUBCUTANEOUS | Status: AC
  Administered 2017-06-10: 120 mg via SUBCUTANEOUS

## 2017-06-10 NOTE — Patient Instructions (Signed)
Denosumab injection  What is this medicine?  DENOSUMAB (den oh sue mab) slows bone breakdown. Prolia is used to treat osteoporosis in women after menopause and in men. Xgeva is used to prevent bone fractures and other bone problems caused by cancer bone metastases. Xgeva is also used to treat giant cell tumor of the bone.  This medicine may be used for other purposes; ask your health care provider or pharmacist if you have questions.  What should I tell my health care provider before I take this medicine?  They need to know if you have any of these conditions:  -dental disease  -eczema  -infection or history of infections  -kidney disease or on dialysis  -low blood calcium or vitamin D  -malabsorption syndrome  -scheduled to have surgery or tooth extraction  -taking medicine that contains denosumab  -thyroid or parathyroid disease  -an unusual reaction to denosumab, other medicines, foods, dyes, or preservatives  -pregnant or trying to get pregnant  -breast-feeding  How should I use this medicine?  This medicine is for injection under the skin. It is given by a health care professional in a hospital or clinic setting.  If you are getting Prolia, a special MedGuide will be given to you by the pharmacist with each prescription and refill. Be sure to read this information carefully each time.  For Prolia, talk to your pediatrician regarding the use of this medicine in children. Special care may be needed. For Xgeva, talk to your pediatrician regarding the use of this medicine in children. While this drug may be prescribed for children as young as 13 years for selected conditions, precautions do apply.  Overdosage: If you think you have taken too much of this medicine contact a poison control center or emergency room at once.  NOTE: This medicine is only for you. Do not share this medicine with others.  What if I miss a dose?  It is important not to miss your dose. Call your doctor or health care professional if you are  unable to keep an appointment.  What may interact with this medicine?  Do not take this medicine with any of the following medications:  -other medicines containing denosumab  This medicine may also interact with the following medications:  -medicines that suppress the immune system  -medicines that treat cancer  -steroid medicines like prednisone or cortisone  This list may not describe all possible interactions. Give your health care provider a list of all the medicines, herbs, non-prescription drugs, or dietary supplements you use. Also tell them if you smoke, drink alcohol, or use illegal drugs. Some items may interact with your medicine.  What should I watch for while using this medicine?  Visit your doctor or health care professional for regular checks on your progress. Your doctor or health care professional may order blood tests and other tests to see how you are doing.  Call your doctor or health care professional if you get a cold or other infection while receiving this medicine. Do not treat yourself. This medicine may decrease your body's ability to fight infection.  You should make sure you get enough calcium and vitamin D while you are taking this medicine, unless your doctor tells you not to. Discuss the foods you eat and the vitamins you take with your health care professional.  See your dentist regularly. Brush and floss your teeth as directed. Before you have any dental work done, tell your dentist you are receiving this medicine.  Do   not become pregnant while taking this medicine or for 5 months after stopping it. Women should inform their doctor if they wish to become pregnant or think they might be pregnant. There is a potential for serious side effects to an unborn child. Talk to your health care professional or pharmacist for more information.  What side effects may I notice from receiving this medicine?  Side effects that you should report to your doctor or health care professional as soon as  possible:  -allergic reactions like skin rash, itching or hives, swelling of the face, lips, or tongue  -breathing problems  -chest pain  -fast, irregular heartbeat  -feeling faint or lightheaded, falls  -fever, chills, or any other sign of infection  -muscle spasms, tightening, or twitches  -numbness or tingling  -skin blisters or bumps, or is dry, peels, or red  -slow healing or unexplained pain in the mouth or jaw  -unusual bleeding or bruising  Side effects that usually do not require medical attention (Report these to your doctor or health care professional if they continue or are bothersome.):  -muscle pain  -stomach upset, gas  This list may not describe all possible side effects. Call your doctor for medical advice about side effects. You may report side effects to FDA at 1-800-FDA-1088.  Where should I keep my medicine?  This medicine is only given in a clinic, doctor's office, or other health care setting and will not be stored at home.  NOTE: This sheet is a summary. It may not cover all possible information. If you have questions about this medicine, talk to your doctor, pharmacist, or health care provider.      2016, [employer]vier/Gold Standard. (2011-09-10 12:37:47)

## 2017-06-11 LAB — CANCER ANTIGEN 27.29: CAN 27.29: 24.5 U/mL (ref 0.0–38.6)

## 2017-06-13 ENCOUNTER — Encounter (HOSPITAL_COMMUNITY): Payer: Self-pay | Admitting: Emergency Medicine

## 2017-06-13 ENCOUNTER — Emergency Department (HOSPITAL_COMMUNITY): Payer: Medicare Other

## 2017-06-13 ENCOUNTER — Emergency Department (HOSPITAL_COMMUNITY)
Admission: EM | Admit: 2017-06-13 | Discharge: 2017-06-13 | Disposition: A | Discharge status: Home / Self Care | Payer: Medicare Other | Attending: Emergency Medicine | Admitting: Emergency Medicine

## 2017-06-13 ENCOUNTER — Other Ambulatory Visit: Payer: Self-pay

## 2017-06-13 DIAGNOSIS — R2243 Localized swelling, mass and lump, lower limb, bilateral: Secondary | ICD-10-CM | POA: Diagnosis not present

## 2017-06-13 DIAGNOSIS — R0600 Dyspnea, unspecified: Secondary | ICD-10-CM | POA: Insufficient documentation

## 2017-06-13 DIAGNOSIS — Z87891 Personal history of nicotine dependence: Secondary | ICD-10-CM | POA: Insufficient documentation

## 2017-06-13 DIAGNOSIS — R109 Unspecified abdominal pain: Secondary | ICD-10-CM | POA: Diagnosis not present

## 2017-06-13 DIAGNOSIS — I10 Essential (primary) hypertension: Secondary | ICD-10-CM | POA: Diagnosis not present

## 2017-06-13 DIAGNOSIS — Z79899 Other long term (current) drug therapy: Secondary | ICD-10-CM | POA: Diagnosis not present

## 2017-06-13 DIAGNOSIS — H1033 Unspecified acute conjunctivitis, bilateral: Secondary | ICD-10-CM | POA: Diagnosis not present

## 2017-06-13 DIAGNOSIS — H109 Unspecified conjunctivitis: Secondary | ICD-10-CM

## 2017-06-13 DIAGNOSIS — Z91040 Latex allergy status: Secondary | ICD-10-CM | POA: Insufficient documentation

## 2017-06-13 DIAGNOSIS — J45909 Unspecified asthma, uncomplicated: Secondary | ICD-10-CM | POA: Diagnosis not present

## 2017-06-13 DIAGNOSIS — R05 Cough: Secondary | ICD-10-CM

## 2017-06-13 DIAGNOSIS — Z853 Personal history of malignant neoplasm of breast: Secondary | ICD-10-CM | POA: Insufficient documentation

## 2017-06-13 DIAGNOSIS — R059 Cough, unspecified: Secondary | ICD-10-CM

## 2017-06-13 LAB — COMPREHENSIVE METABOLIC PANEL
ALBUMIN: 4 g/dL (ref 3.5–5.0)
ALT: 11 U/L — AB (ref 14–54)
AST: 20 U/L (ref 15–41)
Alkaline Phosphatase: 90 U/L (ref 38–126)
Anion gap: 9 (ref 5–15)
BILIRUBIN TOTAL: 0.9 mg/dL (ref 0.3–1.2)
BUN: 15 mg/dL (ref 6–20)
CHLORIDE: 107 mmol/L (ref 101–111)
CO2: 25 mmol/L (ref 22–32)
CREATININE: 0.74 mg/dL (ref 0.44–1.00)
Calcium: 8.9 mg/dL (ref 8.9–10.3)
GFR calc Af Amer: >60 mL/min (ref >60)
GLUCOSE: 95 mg/dL (ref 65–99)
Potassium: 4 mmol/L (ref 3.5–5.1)
Sodium: 141 mmol/L (ref 135–145)
Total Protein: 7.6 g/dL (ref 6.5–8.1)

## 2017-06-13 LAB — CBC WITH DIFFERENTIAL/PLATELET
BASOS ABS: 0 10*3/uL (ref 0.0–0.1)
BASOS PCT: 1 %
Eosinophils Absolute: 0.2 10*3/uL (ref 0.0–0.7)
Eosinophils Relative: 4 %
HEMATOCRIT: 37 % (ref 36.0–46.0)
Hemoglobin: 12.3 g/dL (ref 12.0–15.0)
LYMPHS PCT: 30 %
Lymphs Abs: 1.2 10*3/uL (ref 0.7–4.0)
MCH: 29.9 pg (ref 26.0–34.0)
MCHC: 33.2 g/dL (ref 30.0–36.0)
MCV: 89.8 fL (ref 78.0–100.0)
Monocytes Absolute: 0.4 10*3/uL (ref 0.1–1.0)
Monocytes Relative: 11 %
NEUTROS ABS: 2.1 10*3/uL (ref 1.7–7.7)
Neutrophils Relative %: 54 %
PLATELETS: 277 10*3/uL (ref 150–400)
RBC: 4.12 MIL/uL (ref 3.87–5.11)
RDW: 14.7 % (ref 11.5–15.5)
WBC: 3.9 10*3/uL — AB (ref 4.0–10.5)

## 2017-06-13 LAB — TROPONIN I

## 2017-06-13 LAB — D-DIMER, QUANTITATIVE: D-Dimer, Quant: 2.25 ug/mL-FEU — ABNORMAL HIGH (ref 0.00–0.50)

## 2017-06-13 MED ORDER — SODIUM CHLORIDE 0.9 % IV BOLUS (SEPSIS)
500.0000 mL | Freq: Once | INTRAVENOUS | Status: AC
  Administered 2017-06-13: 500 mL via INTRAVENOUS

## 2017-06-13 MED ORDER — ONDANSETRON HCL 4 MG/2ML IJ SOLN
4.0000 mg | Freq: Once | INTRAMUSCULAR | Status: AC
  Administered 2017-06-13: 4 mg via INTRAVENOUS
  Filled 2017-06-13: qty 2

## 2017-06-13 MED ORDER — MORPHINE SULFATE (PF) 4 MG/ML IV SOLN
4.0000 mg | Freq: Once | INTRAVENOUS | Status: AC
  Administered 2017-06-13: 4 mg via INTRAVENOUS
  Filled 2017-06-13: qty 1

## 2017-06-13 MED ORDER — ENOXAPARIN SODIUM 150 MG/ML ~~LOC~~ SOLN
1.0000 mg/kg | Freq: Once | SUBCUTANEOUS | Status: AC
  Administered 2017-06-13: 130 mg via SUBCUTANEOUS
  Filled 2017-06-13: qty 0.87

## 2017-06-13 MED ORDER — AZITHROMYCIN 250 MG PO TABS: 250.0000 mg | ORAL_TABLET | Freq: Every day | ORAL | 0 refills | Status: DC

## 2017-06-13 MED ORDER — TOBRAMYCIN 0.3 % OP SOLN: 2.0000 [drp] | OPHTHALMIC | 0 refills | Status: DC

## 2017-06-13 MED ORDER — IOPAMIDOL (ISOVUE-370) INJECTION 76%
100.0000 mL | Freq: Once | INTRAVENOUS | Status: AC | PRN
  Administered 2017-06-13: 100 mL via INTRAVENOUS

## 2017-06-13 MED ORDER — IPRATROPIUM-ALBUTEROL 0.5-2.5 (3) MG/3ML IN SOLN
3.0000 mL | Freq: Once | RESPIRATORY_TRACT | Status: AC
  Administered 2017-06-13: 3 mL via RESPIRATORY_TRACT
  Filled 2017-06-13: qty 3

## 2017-06-13 NOTE — Discharge Instructions (Addendum)
Chest scan showed no evidence of a blood clot.  Prescription for antibiotic and eyedrops.  Return tomorrow for ultrasound of your legs.  Nurse will give you the appropriate information.

## 2017-06-13 NOTE — ED Triage Notes (Signed)
Pt reports she has breast cancer that has spread to her bones and pain in right rib area is where cancer is.

## 2017-06-13 NOTE — ED Triage Notes (Signed)
Per [facility] pt from home for non productive cough, nasal congestion, and SOB that has worsened over past 3-4 days. Has right rib pain that is worse with palpate and moving/coughing.  Had intermittent abd pain with n/v. Swelling in right eye with pus discharge.

## 2017-06-13 NOTE — ED Notes (Signed)
Patient transported to CT 

## 2017-06-13 NOTE — ED Notes (Signed)
Patient's daughter expressing concerns about the wait and would like to speak with the doctor. EDP notified.

## 2017-06-14 ENCOUNTER — Ambulatory Visit: Payer: Medicare Other | Admitting: Physical Therapy

## 2017-06-14 ENCOUNTER — Ambulatory Visit (HOSPITAL_COMMUNITY)
Admission: RE | Admit: 2017-06-14 | Discharge: 2017-06-14 | Disposition: A | Discharge status: Home / Self Care | Payer: Medicare Other | Source: Ambulatory Visit | Attending: Emergency Medicine | Admitting: Emergency Medicine

## 2017-06-14 DIAGNOSIS — M7989 Other specified soft tissue disorders: Secondary | ICD-10-CM

## 2017-06-14 NOTE — Progress Notes (Signed)
Preliminary note by tech--Bilateral lower extremities venous duplex study completed.  Negative for deep and superficial veins thrombosis.  No Baker's cyst seen. Hongying Liora Myles(RDMS RVT) 06/14/17 2:23 PM

## 2017-06-14 NOTE — ED Provider Notes (Signed)
[facility] DEPT Provider Note   CSN: [id number] Arrival date & time: 06/13/17  1408     History   Chief Complaint Chief Complaint  Patient presents with  . Cough  . multiple compliants    HPI Michaela Little is a 57 y.o. female.  Patient presents with cough for approximately 1 week with associated green mucus from her eyes.  Additionally she is more dyspneic than usual.  No substernal chest pain.  Past medical history is significant for history of left breast cancer in 2010 first diagnosed in 2010 and relapsing in 2017.  Apparently she has also had metastasis.  No fever, sweats, chills.  Review of systems positive for bilateral leg swelling.  Severity of symptoms is moderate.  Exertion makes symptoms worse.     Past Medical History:  Diagnosis Date  . Anxiety   . Arthritis    hands, knees, hips  . Asthma   . Cancer ([place])   . Cervical stenosis (uterine cervix)   . Dyspnea on exertion   . GERD (gastroesophageal reflux disease)   . Headache(784.0)   . History of breast cancer    2010--  LEFT  s/p  mastectomy (in [place]) AND CHEMORADIATION--  NO RECURRENCE  . History of cervical dysplasia   . History of TB skin testing    AS TEEN--  [place] W/ MEDS  . Hyperlipidemia   . Hypertension   . Malignant neoplasm of overlapping sites of left breast in female, estrogen receptor positive ([place]) 03/05/2013  . OSA (obstructive sleep apnea) moderate osa per study  09/2010   CPAP  , NOT USING ON REGULAR BASIS  . Pelvic pain in female   . Positive H. pylori test    08-05-2013  . Refusal of blood transfusions as patient is Jehovah's Witness   . Seasonal allergies   . Uterine fibroid   . Wears glasses     Patient Active Problem List   Diagnosis Date Noted  . Goals of care, counseling/discussion 01/20/2017  . Multinodular thyroid 02/08/2016  . Lumbar back pain with radiculopathy affecting left lower extremity 02/08/2016  . Carcinoma of breast metastatic to  bone ([place]) 02/08/2016  . Bone metastases ([place]) 11/13/2015  . Right thyroid nodule 06/30/2014  . Allergic rhinitis due to pollen 06/30/2014  . Essential hypertension 12/24/2013  . Left ventricular diastolic dysfunction with preserved systolic function 41/93/7902  . Lump in neck 08/11/2013  . Abdominal pain, chronic, bilateral lower quadrant 08/05/2013  . Abdominal pain, epigastric 08/05/2013  . Morbid (severe) obesity due to excess calories ([place]) 06/23/2013  . Allergic rhinitis 06/23/2013  . GERD (gastroesophageal reflux disease) 03/25/2013  . Constipation 03/25/2013  . Other and unspecified hyperlipidemia 03/25/2013  . Dysuria 03/25/2013  . Routine general medical examination at a health care facility 03/25/2013  . Malignant neoplasm of overlapping sites of left breast in female, estrogen receptor positive ([place]) 03/05/2013  . Cough 10/28/2012  . Dyspnea on exertion 10/28/2012  . Chronic abdominal pain 09/02/2012  . Pelvic pain in female 02/06/2012  . Atypical chest pain 11/13/2011  . Hyperlipidemia 11/13/2011  . Dysplasia of cervix, low grade (CIN 1) 08/22/2011  . Diastolic dysfunction 40/97/3532  . Influenza 03/04/2011  . Hypertension 03/04/2011  . Obesity 03/04/2011  . OSA (obstructive sleep apnea) 02/27/2011  . Asthma 02/08/2011    Past Surgical History:  Procedure Laterality Date  . CARDIOVASCULAR STRESS TEST  10-29-2012   low risk perfusion study/  no significant reversibity/ ef 66%/  normal wall motion  . CERVICAL CONIZATION W/BX  2012   in  [place] N/A 05/14/2012   Procedure: LAPAROSCOPIC CHOLECYSTECTOMY;  Surgeon: Ralene Ok, MD;  Location: [facility];  Service: General;  Laterality: N/A;  . COLONOSCOPY  2011   normal per patient - [place]  . DILATION AND CURETTAGE OF UTERUS  10/15/2011   Procedure: DILATATION AND CURETTAGE;  Surgeon: Melina Schools, MD;  Location: [facility];  Service: Gynecology;  Laterality: N/A;  Conization &  endocervical curettings  .  EXAMINATION UNDER ANESTHESIA N/A 08/20/2013   Procedure: EXAM UNDER ANESTHESIA;  Surgeon: Margarette Asal, MD;  Location: [facility];  Service: Gynecology;  Laterality: N/A;  . [place]   right  . LAPAROSCOPIC ASSISTED VAGINAL HYSTERECTOMY N/A 10/26/2014   Procedure: HYSTERECTOMY ABDOMINAL ;  Surgeon: Molli Posey, MD;  Location: [facility];  Service: Gynecology;  Laterality: N/A;  . MASTECTOMY Left 11/2008  in [place]   . SALPINGOOPHORECTOMY Bilateral 10/26/2014   Procedure: BILATERAL SALPINGO OOPHORECTOMY;  Surgeon: Molli Posey, MD;  Location: [facility];  Service: Gynecology;  Laterality: Bilateral;  . TRANSTHORACIC ECHOCARDIOGRAM  10-29-2012   mild lvh/  ef 60-65%/  grade II diastolic dysfunction/  trivial mr  &  tr    OB History    Gravida  10   Para  5   Term  5   Preterm      AB  5   Living  5     SAB  3   TAB  2   Ectopic      Multiple      Live Births               Home Medications    Prior to Admission medications   Medication Sig Start Date End Date Taking? Authorizing Provider  albuterol (PROVENTIL HFA;VENTOLIN HFA) 108 (90 Base) MCG/ACT inhaler Inhale 2 puffs into the lungs every 6 (six) hours as needed for wheezing or shortness of breath.   Yes [provider]  amLODipine (NORVASC) 10 MG tablet Take 1 tablet (10 mg total) by mouth daily. 12/17/16  Yes Clent Demark, PA-C  carvedilol (COREG) 6.25 MG tablet Take 1 tablet (6.25 mg total) by mouth 2 (two) times daily with a meal. 12/17/16  Yes Clent Demark, PA-C  exemestane (AROMASIN) 25 MG tablet Take 1 tablet (25 mg total) by mouth daily after breakfast. 04/15/17  Yes [name], MD  famotidine (PEPCID) 20 MG tablet Take 1 TABLET at bedtime 02/19/17  Yes [name], MD  fluticasone (FLOVENT HFA) 110 MCG/ACT inhaler Inhale 2 puffs into the lungs 2 (two) times daily. With spacer 12/05/16  Yes Kozlow, Donnamarie Poag, MD  furosemide (LASIX) 20 MG tablet  Take 1 tablet (20 mg total) by mouth daily. 12/17/16  Yes Clent Demark, PA-C  loratadine (CLARITIN) 10 MG tablet Take 10 mg by mouth daily.   Yes [provider]  naproxen (NAPROSYN) 500 MG tablet Take 1 tablet (500 mg total) by mouth 2 (two) times daily with a meal. 04/12/17  Yes Clent Demark, PA-C  palbociclib [facility]) 75 MG capsule TAKE 1 CAPSULE (75MG ) BY MOUTH DAILY. TAKE WHOLE WITH FOOD. 05/24/17  Yes [name], MD  pantoprazole (PROTONIX) 40 MG tablet Take 1 tablet (40 mg total) by mouth daily. Take 30-60 min before first meal of the day 01/01/17  Yes [name], MD  promethazine (  PHENERGAN) 25 MG tablet Take 1 tablet (25 mg total) by mouth every 6 (six) hours as needed for nausea or vomiting. 06/03/17  Yes [name], MD  azithromycin (ZITHROMAX) 250 MG tablet Take 1 tablet (250 mg total) by mouth daily. Take first 2 tablets together, then 1 every day until finished. 06/13/17   Nat Christen, MD  cyclobenzaprine (FLEXERIL) 10 MG tablet Take 1 tablet (10 mg total) by mouth 2 (two) times daily. Patient not taking: Reported on 06/13/2017 04/12/17   Clent Demark, PA-C  hydrOXYzine (ATARAX/VISTARIL) 25 MG tablet Take 1 tablet (25 mg total) by mouth 2 (two) times daily. Patient not taking: Reported on 06/13/2017 02/18/17   Clent Demark, PA-C  montelukast (SINGULAIR) 10 MG tablet Take 1 tablet (10 mg total) by mouth at bedtime. Patient not taking: Reported on 05/22/2017 11/27/16   Jiles Prows, MD  ranitidine (ZANTAC) 300 MG tablet Take 1 tablet (300 mg total) by mouth at bedtime. Patient not taking: Reported on 06/13/2017 12/05/16   Jiles Prows, MD  tobramycin (TOBREX) 0.3 % ophthalmic solution Place 2 drops into both eyes every 4 (four) hours. 06/13/17   Nat Christen, MD  zolpidem (AMBIEN) 5 MG tablet Take 1 tablet (5 mg total) by mouth at bedtime as needed for sleep. Patient not taking: Reported on 05/22/2017 04/12/17   Clent Demark, PA-C    Family History Family  History  Problem Relation Age of Onset  . Breast cancer Mother   . Colon cancer Mother   . Hypotension Mother   . Asthma Mother   . Diabetes type II Mother   . Arthritis Mother   . Clotting disorder Mother   . Cancer Mother        breast/colon  . Mental illness Brother   . Heart disease Brother   . Cerebral palsy Daughter   . Emphysema Brother        never smoker  . Colon cancer Maternal Aunt   . Cancer Maternal Aunt        colon  . Colon cancer Maternal Uncle   . Cancer Maternal Uncle        colon  . Esophageal cancer Neg Hx   . Rectal cancer Neg Hx   . Stomach cancer Neg Hx   . Thyroid disease Neg Hx     Social History Social History   Tobacco Use  . Smoking status: Former Smoker    Packs/day: 0.30    Years: 10.00    Pack years: 3.00    Types: Cigarettes    Last attempt to quit: 03/26/1988    Years since quitting: 29.2  . Smokeless tobacco: Never Used  Substance Use Topics  . Alcohol use: No  . Drug use: No     Allergies   Lisinopril-hydrochlorothiazide; Adhesive [tape]; Fentanyl; Hctz [hydrochlorothiazide]; Latex; Lisinopril; Losartan potassium; and Oxycodone   Review of Systems Review of Systems  All other systems reviewed and are negative.    Physical Exam Updated Vital Signs BP (!) 145/84 (BP Location: Right Arm)   Pulse 79   Temp 97.9 F (36.6 C) (Oral)   Resp 14   Wt 130.8 kg (288 lb 5.8 oz)   LMP 02/25/2009   SpO2 97%   BMI 45.16 kg/m   Physical Exam  Constitutional: She is oriented to person, place, and time.  High BMI; no acute distress.  HENT:  Head: Normocephalic and atraumatic.  Eyes: Conjunctivae are normal.  Minimal mucus discharge from eyes.  Neck: Neck supple.  Cardiovascular: Normal rate and regular rhythm.  Pulmonary/Chest: Effort normal and breath sounds normal.  Abdominal: Soft. Bowel sounds are normal.  Musculoskeletal: Normal range of motion.  Neurological: She is alert and oriented to person, place, and time.    Skin: Skin is warm and dry.  2+ peripheral edema.  Psychiatric: She has a normal mood and affect. Her behavior is normal.  Nursing note and vitals reviewed.    ED Treatments / Results  Labs (all labs ordered are listed, but only abnormal results are displayed) Labs Reviewed  CBC WITH DIFFERENTIAL/PLATELET - Abnormal; Notable for the following components:      Result Value   WBC 3.9 (*)    All other components within normal limits  COMPREHENSIVE METABOLIC PANEL - Abnormal; Notable for the following components:   ALT 11 (*)    All other components within normal limits  D-DIMER, QUANTITATIVE (NOT AT [facility]) - Abnormal; Notable for the following components:   D-Dimer, Quant 2.25 (*)    All other components within normal limits  TROPONIN I    EKG  EKG Interpretation  Date/Time:  Thursday June 13 2017 17:42:17 EDT Ventricular Rate:  58 PR Interval:    QRS Duration: 91 QT Interval:  439 QTC Calculation: 432 R Axis:   19 Text Interpretation:  Sinus rhythm Confirmed by Nat Christen [phone]) on 06/13/2017 8:47:15 PM Also confirmed by Nat Christen [phone]), editor Hattie Perch ([id number])  on 06/14/2017 7:14:51 AM       Radiology Dg Chest 2 View  Result Date: 06/13/2017 CLINICAL DATA:  Cough EXAM: CHEST - 2 VIEW COMPARISON:  12/28/2016 FINDINGS: There is no focal parenchymal opacity. There is no pleural effusion or pneumothorax. The heart and mediastinal contours are unremarkable. Again noted is a tissue expander along the left anterior chest wall. The osseous structures are unremarkable. IMPRESSION: No active cardiopulmonary disease. Electronically Signed   By: Kathreen Devoid   On: 06/13/2017 14:56   Ct Angio Chest Pe W And/or Wo Contrast  Result Date: 06/13/2017 CLINICAL DATA:  Worsening shortness of breath and nonproductive cough over the past 3-4 days. EXAM: CT ANGIOGRAPHY CHEST WITH CONTRAST TECHNIQUE: Multidetector CT imaging of the chest was performed using the standard protocol  during bolus administration of intravenous contrast. Multiplanar CT image reconstructions and MIPs were obtained to evaluate the vascular anatomy. CONTRAST:  12mL ISOVUE-370 IOPAMIDOL (ISOVUE-370) INJECTION 76% COMPARISON:  CTA chest dated December 12, 2016. FINDINGS: Cardiovascular: Satisfactory opacification of the pulmonary arteries to the segmental level. No evidence of pulmonary embolism. Normal heart size. No pericardial effusion. Normal caliber thoracic aorta. Mediastinum/Nodes: Prior left axillary lymph node dissection. No enlarged mediastinal, hilar, or axillary lymph nodes. Unchanged hypodense right thyroid nodules measuring up to 1.3 cm. The trachea and esophagus demonstrate no significant findings. Lungs/Pleura: Bibasilar atelectasis. No focal consolidation, pleural effusion, or pneumothorax. There are a few new small pulmonary nodules in both upper lobes in the left lower lobe, measuring up to 5 mm. Upper Abdomen: No acute abnormality. Musculoskeletal: Unchanged left breast implant with intracapsular rupture. Diffusely heterogeneous bone marrow density is similar to prior study. Review of the MIP images confirms the above findings. IMPRESSION: 1.  No evidence of pulmonary embolism. 2.  No acute intrathoracic process. 3. There are a few new small pulmonary nodules in both lungs measuring up to 5 mm. Noncontrast chest CT in 6-12 months is recommended to evaluate for interval change. 4. Diffusely heterogeneous bone marrow density without focal lesion, similar to  prior study. Bone scan in November 2018 was negative for osseous metastatic disease. Electronically Signed   By: Titus Dubin M.D.   On: 06/13/2017 20:51    Procedures Procedures (including critical care time)  Medications Ordered in ED Medications  ipratropium-albuterol (DUONEB) 0.5-2.5 (3) MG/3ML nebulizer solution 3 mL (3 mLs Nebulization Given 06/13/17 1725)  ondansetron (ZOFRAN) injection 4 mg (4 mg Intravenous Given 06/13/17 2004)   morphine 4 MG/ML injection 4 mg (4 mg Intravenous Given 06/13/17 2005)  sodium chloride 0.9 % bolus 500 mL (0 mLs Intravenous Stopped 06/13/17 2254)  iopamidol (ISOVUE-370) 76 % injection 100 mL (100 mLs Intravenous Contrast Given 06/13/17 2021)  enoxaparin (LOVENOX) injection 130 mg (130 mg Subcutaneous Given 06/13/17 2159)     Initial Impression / Assessment and Plan / ED Course  I have reviewed the triage vital signs and the nursing notes.  Pertinent labs & imaging results that were available during my care of the patient were reviewed by me and considered in my medical decision making (see chart for details).     Patient presents with worsening dyspnea and history of breast cancer.  I obtained a CT angiogram of her chest to rule out a pulmonary embolism.  This was negative for PE.  There are a few new pulmonary nodules.  Additionally she was given Lovenox 1 mg/kg subcu.  Will obtain Doppler ultrasound of her legs on Friday morning.  These findings were discussed with the patient in great detail.  She understands to return for her study.  Final Clinical Impressions(s) / ED Diagnoses   Final diagnoses:  Cough  Conjunctivitis of both eyes, unspecified conjunctivitis type    ED Discharge Orders        Ordered    azithromycin (ZITHROMAX) 250 MG tablet  Daily     06/13/17 2155    tobramycin (TOBREX) 0.3 % ophthalmic solution  Every 4 hours     06/13/17 2155    LE VENOUS     06/13/17 2155       Nat Christen, MD 06/14/17 1207

## 2017-06-17 ENCOUNTER — Ambulatory Visit (INDEPENDENT_AMBULATORY_CARE_PROVIDER_SITE_OTHER): Payer: Medicare Other | Admitting: Physician Assistant

## 2017-06-17 ENCOUNTER — Other Ambulatory Visit: Payer: Self-pay

## 2017-06-17 ENCOUNTER — Telehealth: Payer: Self-pay | Admitting: *Deleted

## 2017-06-17 ENCOUNTER — Encounter (INDEPENDENT_AMBULATORY_CARE_PROVIDER_SITE_OTHER): Payer: Self-pay | Admitting: Physician Assistant

## 2017-06-17 VITALS — BP 136/79 | HR 64 | Temp 97.8°F | Ht 67.0 in | Wt 291.4 lb

## 2017-06-17 DIAGNOSIS — R918 Other nonspecific abnormal finding of lung field: Secondary | ICD-10-CM

## 2017-06-17 DIAGNOSIS — Z23 Encounter for immunization: Secondary | ICD-10-CM | POA: Diagnosis not present

## 2017-06-17 DIAGNOSIS — Z09 Encounter for follow-up examination after completed treatment for conditions other than malignant neoplasm: Secondary | ICD-10-CM

## 2017-06-17 NOTE — Progress Notes (Signed)
Subjective:  Patient ID: Michaela Little, female    DOB: [birth year]  Age: 57 y.o. MRN: [id number]  CC: annual exam  HPI Michaela Little a 57 y.o.femalewith a PMH of Anxiety, Breast CA, HTN, OSA, Asthma, and dyspnea presents because her insurance said to go to her PCP for an "ability exam". Does not have any paperwork from her insurance to have filled out. Says she is still going to oncology for breast cancer. Went to the ED four days ago for a cough. Had an elevated D-dimer but CTPA and bilateral LE doppler US was negative for clots. However, there was an incidental finding of new pulmonary nodules on CTPA measuring up to 28mm. Oncology reportedly notified by ED personnel but patient has not heard anything yet from oncology. Cough treated with azithromycin. Seems to be resolving. Does not endorse any other complaints at this time.       Outpatient Medications Prior to Visit  Medication Sig Dispense Refill  . albuterol (PROVENTIL HFA;VENTOLIN HFA) 108 (90 Base) MCG/ACT inhaler Inhale 2 puffs into the lungs every 6 (six) hours as needed for wheezing or shortness of breath.    Marland Kitchen amLODipine (NORVASC) 10 MG tablet Take 1 tablet (10 mg total) by mouth daily. 90 tablet 3  . azithromycin (ZITHROMAX) 250 MG tablet Take 1 tablet (250 mg total) by mouth daily. Take first 2 tablets together, then 1 every day until finished. 6 tablet 0  . carvedilol (COREG) 6.25 MG tablet Take 1 tablet (6.25 mg total) by mouth 2 (two) times daily with a meal. 180 tablet 3  . fluticasone (FLOVENT HFA) 110 MCG/ACT inhaler Inhale 2 puffs into the lungs 2 (two) times daily. With spacer 1 Inhaler 5  . furosemide (LASIX) 20 MG tablet Take 1 tablet (20 mg total) by mouth daily. 90 tablet 3  . hydrOXYzine (ATARAX/VISTARIL) 25 MG tablet Take 1 tablet (25 mg total) by mouth 2 (two) times daily. 40 tablet 0  . loratadine (CLARITIN) 10 MG tablet Take 10 mg by mouth daily.    . naproxen (NAPROSYN) 500 MG tablet Take 1 tablet (500  mg total) by mouth 2 (two) times daily with a meal. 30 tablet 0  . pantoprazole (PROTONIX) 40 MG tablet Take 1 tablet (40 mg total) by mouth daily. Take 30-60 min before first meal of the day 30 tablet 2  . promethazine (PHENERGAN) 25 MG tablet Take 1 tablet (25 mg total) by mouth every 6 (six) hours as needed for nausea or vomiting. 30 tablet 0  . tobramycin (TOBREX) 0.3 % ophthalmic solution Place 2 drops into both eyes every 4 (four) hours. 5 mL 0  . cyclobenzaprine (FLEXERIL) 10 MG tablet Take 1 tablet (10 mg total) by mouth 2 (two) times daily. (Patient not taking: Reported on 06/13/2017) 30 tablet 0  . exemestane (AROMASIN) 25 MG tablet Take 1 tablet (25 mg total) by mouth daily after breakfast. 30 tablet 2  . famotidine (PEPCID) 20 MG tablet Take 1 TABLET at bedtime (Patient not taking: Reported on 06/17/2017) 30 tablet 2  . palbociclib (IBRANCE) 75 MG capsule TAKE 1 CAPSULE (75MG ) BY MOUTH DAILY. TAKE WHOLE WITH FOOD. 21 capsule 2  . ranitidine (ZANTAC) 300 MG tablet Take 1 tablet (300 mg total) by mouth at bedtime. (Patient not taking: Reported on 06/13/2017) 30 tablet 5  . montelukast (SINGULAIR) 10 MG tablet Take 1 tablet (10 mg total) by mouth at bedtime. (Patient not taking: Reported on 05/22/2017) 30 tablet 5  .  zolpidem (AMBIEN) 5 MG tablet Take 1 tablet (5 mg total) by mouth at bedtime as needed for sleep. (Patient not taking: Reported on 05/22/2017) 30 tablet 0   No facility-administered medications prior to visit.      ROS Review of Systems  Constitutional: Negative for chills, fever and malaise/fatigue.  Eyes: Negative for blurred vision.  Respiratory: Negative for shortness of breath.   Cardiovascular: Negative for chest pain and palpitations.  Gastrointestinal: Negative for abdominal pain and nausea.  Genitourinary: Negative for dysuria and hematuria.  Musculoskeletal: Negative for joint pain and myalgias.  Skin: Negative for rash.  Neurological: Negative for tingling and  headaches.  Psychiatric/Behavioral: Negative for depression. The patient is not nervous/anxious.     Objective:  BP 136/79 (BP Location: Right Arm, Patient Position: Sitting, Cuff Size: Large)   Pulse 64   Temp 97.8 F (36.6 C) (Oral)   Ht 5\' 7"  (1.702 m)   Wt 291 lb 6.4 oz (132.2 kg)   LMP 02/25/2009   SpO2 98%   BMI 45.64 kg/m   BP/Weight 06/17/2017 06/13/2017 8/52/7782  Systolic BP 423 536 144  Diastolic BP 79 84 69  Wt. (Lbs) 291.4 288.36 -  BMI 45.64 45.16 -      Physical Exam  Constitutional: She is oriented to person, place, and time.  Well developed, well nourished, NAD, polite, using walker, no coughing during entire encounter  HENT:  Head: Normocephalic and atraumatic.  Eyes: No scleral icterus.  Neck: Normal range of motion.  Cardiovascular: Normal rate, regular rhythm and normal heart sounds.  Pulmonary/Chest: Effort normal and breath sounds normal. No respiratory distress.  Musculoskeletal: She exhibits no edema.  Neurological: She is alert and oriented to person, place, and time. No cranial nerve deficit. Coordination normal.  Skin: Skin is warm and dry. No rash noted. No erythema. No pallor.  Psychiatric: She has a normal mood and affect. Her behavior is normal. Thought content normal.  Vitals reviewed.    Assessment & Plan:    1. Hospital discharge follow-up - Cough clearing with azithromycin. D-dimer elevated but no evidence of PE or DVT on imaging. Return/call if symptoms persist or worsen.  2. Need for Tdap vaccination - Tdap vaccine greater than or equal to 7yo IM; Future  3. Pulmonary nodules - Advised for patient to discuss with oncology for further assessment. May likely be re-imaged in 6-12 months.   *Pt allowed time to call her insurance to find out if there is any paperwork I would need to fill out for her. She was told that she did not have to fill out any forms in particular and she was just advised to f/u periodically with her PCP.    Follow-up: Return in about 4 months (around 10/17/2017) for f/u health maintenance.   Clent Demark PA

## 2017-06-17 NOTE — Telephone Encounter (Signed)
I have called her back and discussed the CT scan findings.  I plan to repeat CT chest in 3-6 months.  She is scheduled to see me back on April 15.   [name] MD

## 2017-06-17 NOTE — Telephone Encounter (Signed)
Pt called reporting that she went to ER last Thursday for cough, and eyes problem.  Had CT scan done.  Pt was told to follow up with Dr. Burr Medico for new lung nodules from CT scan. Pt's    Phone      [phone].

## 2017-06-17 NOTE — Patient Instructions (Signed)
Td Vaccine (Tetanus and Diphtheria): What You Need to Know 1. Why get vaccinated? Tetanus  and diphtheria are very serious diseases. They are rare in the [place] today, but people who do become infected often have severe complications. Td vaccine is used to protect adolescents and adults from both of these diseases. Both tetanus and diphtheria are infections caused by bacteria. Diphtheria spreads from person to person through coughing or sneezing. Tetanus-causing bacteria enter the body through cuts, scratches, or wounds. TETANUS (lockjaw) causes painful muscle tightening and stiffness, usually all over the body.  It can lead to tightening of muscles in the head and neck so you can't open your mouth, swallow, or sometimes even breathe. Tetanus kills about 1 out of every 10 people who are infected even after receiving the best medical care.  DIPHTHERIA can cause a thick coating to form in the back of the throat.  It can lead to breathing problems, paralysis, heart failure, and death.  Before vaccines, as many as 200,000 cases of diphtheria and hundreds of cases of tetanus were reported in the [place] each year. Since vaccination began, reports of cases for both diseases have dropped by about 99%. 2. Td vaccine Td vaccine can protect adolescents and adults from tetanus and diphtheria. Td is usually given as a booster dose every 10 years but it can also be given earlier after a severe and dirty wound or burn. Another vaccine, called Tdap, which protects against pertussis in addition to tetanus and diphtheria, is sometimes recommended instead of Td vaccine. Your doctor or the person giving you the vaccine can give you more information. Td may safely be given at the same time as other vaccines. 3. Some people should not get this vaccine  A person who has ever had a life-threatening allergic reaction after a previous dose of any tetanus or diphtheria containing vaccine, OR has a severe  allergy to any part of this vaccine, should not get Td vaccine. Tell the person giving the vaccine about any severe allergies.  Talk to your doctor if you: ? had severe pain or swelling after any vaccine containing diphtheria or tetanus, ? ever had a condition called Guillain Barre Syndrome (GBS), ? aren't feeling well on the day the shot is scheduled. 4. What are the risks from Td vaccine? With any medicine, including vaccines, there is a chance of side effects. These are usually mild and go away on their own. Serious reactions are also possible but are rare. Most people who get Td vaccine do not have any problems with it. Mild problems following Td vaccine: (Did not interfere with activities)  Pain where the shot was given (about 8 people in 10)  Redness or swelling where the shot was given (about 1 person in 4)  Mild fever (rare)  Headache (about 1 person in 4)  Tiredness (about 1 person in 4)  Moderate problems following Td vaccine: (Interfered with activities, but did not require medical attention)  Fever over 102F (rare)  Severe problems following Td vaccine: (Unable to perform usual activities; required medical attention)  Swelling, severe pain, bleeding and/or redness in the arm where the shot was given (rare).  Problems that could happen after any vaccine:  People sometimes faint after a medical procedure, including vaccination. Sitting or lying down for about 15 minutes can help prevent fainting, and injuries caused by a fall. Tell your doctor if you feel dizzy, or have vision changes or ringing in the ears.  Some people get   severe pain in the shoulder and have difficulty moving the arm where a shot was given. This happens very rarely.  Any medication can cause a severe allergic reaction. Such reactions from a vaccine are very rare, estimated at fewer than 1 in a million doses, and would happen within a few minutes to a few hours after the vaccination. As with any  medicine, there is a very remote chance of a vaccine causing a serious injury or death. The safety of vaccines is always being monitored. For more information, visit: www.cdc.gov/vaccinesafety/ 5. What if there is a serious reaction? What should I look for? Look for anything that concerns you, such as signs of a severe allergic reaction, very high fever, or unusual behavior. Signs of a severe allergic reaction can include hives, swelling of the face and throat, difficulty breathing, a fast heartbeat, dizziness, and weakness. These would usually start a few minutes to a few hours after the vaccination. What should I do?  If you think it is a severe allergic reaction or other emergency that can't wait, call 9-1-1 or get the person to the nearest hospital. Otherwise, call your doctor.  Afterward, the reaction should be reported to the Vaccine Adverse Event Reporting System (VAERS). Your doctor might file this report, or you can do it yourself through the VAERS web site at www.vaers.hhs.gov, or by calling 1-800-822-7967. ? VAERS does not give medical advice. 6. The National Vaccine Injury Compensation Program The National Vaccine Injury Compensation Program (VICP) is a federal program that was created to compensate people who may have been injured by certain vaccines. Persons who believe they may have been injured by a vaccine can learn about the program and about filing a claim by calling 1-800-338-2382 or visiting the VICP website at www.hrsa.gov/vaccinecompensation. There is a time limit to file a claim for compensation. 7. How can I learn more?  Ask your doctor. He or she can give you the vaccine package insert or suggest other sources of information.  Call your local or state health department.  Contact the Centers for Disease Control and Prevention (CDC): ? Call 1-800-232-4636 (1-800-CDC-INFO) ? Visit CDC's website at www.cdc.gov/vaccines CDC Td Vaccine VIS (07/05/15) This information is  not intended to replace advice given to you by your health care provider. Make sure you discuss any questions you have with your health care provider. Document Released: 01/07/2006 Document Revised: 12/01/2015 Document Reviewed: 12/01/2015 [employer] Interactive Patient Education  2017 [employer]  

## 2017-07-04 ENCOUNTER — Encounter: Payer: Self-pay | Admitting: Physical Therapy

## 2017-07-04 ENCOUNTER — Other Ambulatory Visit (HOSPITAL_COMMUNITY): Payer: Medicare Other

## 2017-07-05 NOTE — Progress Notes (Signed)
[facility]  [phone] [phone]  Clinic Follow up Note   Patient Care Team: [name] as PCP - General (Physician Assistant) [name], MD as Consulting Physician (Pulmonary Disease) [name], MD as Consulting Physician (Hematology)   Date of Service:  07/08/2017   CHIEF COMPLAINT: F/U metastatic breast cancer   SUMMARY OF ONCOLOGIC HISTORY: Oncology History   Cancer Staging Malignant neoplasm of overlapping sites of left breast in female, estrogen receptor positive ([place]) Staging form: Breast, AJCC 7th Edition - Clinical: Stage IIIA (T2, N2, M0) - Unsigned       Malignant neoplasm of overlapping sites of left breast in female, estrogen receptor positive ([place])   05/2008 Cancer Diagnosis    Left sided breast cancer (no path report available)      11/2008 Pathologic Stage    Stage IIIA: T2 N2       Neo-Adjuvant Chemotherapy    Paclitaxel weekly x 12; doxorubicin and cyclophosphamide x 4 - both given with trifiparnib (treated at [facility] in [place])      11/2008 Definitive Surgery    Left modified radiation mastectomy: invasive lobular carcinoma, ER+, PR+, HER2/neu negative, 5/22 LN positive       - 03/2009 Radiation Therapy    Adjuvant radiation to left chest wall      2011 - 08/2014 Anti-estrogen oral therapy    Tamoxifen 20 mg used briefly; discontinued due to uterine lining concerns; changed to anastrozole 1 mg daily (began 07/2100); discontinued by patient summer of 2016      05/26/2015 Progression    Iliac bone biopsy showed metastatic carcinoma consistent with a breast primary, ER positive, PR and HER-2 negative. Her staging CT, and a PET scan was negative for visceral metastasis.      05/2015 -  Anti-estrogen oral therapy    1. letrozole and Ibrance started March 2017             -Ibrance dose decreased to 75 mg daily  -Letrozole switched to Exemestane on 02/13/17 due to b/l rib pain.       11/16/2015 Miscellaneous    Xgeva monthly for bone metastasis       07/03/2016 Imaging    CT chest, abdomen and pelvis with contrast showed no evidence of metastasis.       02/07/2017 Imaging    CT AP W Contrast 02/07/17 IMPRESSION: 1. No CT findings for abdominal/pelvic metastatic disease. 2. No acute abdominal findings, mass lesions or adenopathy. 3. Status post cholecystectomy with mild associated common bile duct dilatation. 4. Somewhat thickened appendix appears relatively stable. No acute inflammatory process. 5. Stable mixed lytic and sclerotic osseous metastatic disease.       02/07/2017 Imaging    Bone Scan Whole Body 02/07/17 IMPRESSION: No scintigraphic evidence skeletal metastasis        INTERIM HISTORY: 01/17/17 Michaela Little is here for a follow up of her metastatic breast cancer. I am taking over her care from Dr. Jana Hakim. This is my first time seeing her.   Today she is on her off week of Ibrance and is on 32m. She notes she feels exhausted and is often SOB. She has been having right sided abdominal pain that was worse 2 weeks ago, she say symptom management clinic and ER. She has been having ongoing pain in the right groin area but worsened lately. She is also having pain around her left lower ribs. She has not tried gabapentin and she takes  tramadol as needed for pain. Right now her pain is tolerable. Sometime her pain can be past 10/10 but now the pain is 6-7/10 She is taking her BP medication for her HTN, she started Flovent due to her current trouble breathing, given by Dr. Melvyn Novas. Her legs swell but no doppler has been done before. She also gets a lot of fluid in her stomach. She is also on Lasix, given by her PCP, Dr. Altamease Oiler. She has arthritis. She still has tissue expander in left breast where she has some pain and tenderness. Her PCP changed her heart medication because her heart rate was slow when he last saw her. She would like to get to the bottom of her  issues as she has difficulty falling asleep at night.  She lives by herself in a community residence. She lives in [place] but still also have doctors and family in [place] as well. She has 5 children. She does not drive. She does have SCAT forms to be filled out.    CURRENT TREATMENT:   1. letrozole and Ibrance started March 2017   -Ibrance dose decreased to 75 mg daily, held on 06/03/2017 due to pending surgery, pt refused to restart    -Letrozole switched to Exemestane on 02/13/17 due to b/l rib pain.  2. Xgeva started 11/16/2015, repeated monthly   INTERVAL HISTORY:  Michaela Little is here for a follow up of her metastatic breast cancer. She presents to the clinic today by herself. She reports she is doing okay overall. She states she is fatigued and sometimes she forgets things. She notes she is in the process of getting home care. She is able to fix herself a meal and she notes that her daughter and friend come see her every now and then. She was initially supposed to have surgery on 07/11/17 and we advised her to stop Ibrance mid March. However, her surgery date was moved to 08/22/17 and she still has not restarted Ibrance. She reports she is still having some pain under her ribcage on the right side.   On review of systems, pt denies any other complaints at this time. Pertinent positives are listed and detailed within the above HPI.   REVIEW OF SYSTEMS:   Constitutional: Denies fevers, chills or abnormal weight loss  Eyes: Denies blurriness of vision Ears, nose, mouth, throat, and face: Denies mucositis, sore throat  Respiratory: Denies cough or wheezes (+) SOB  Cardiovascular: Denies palpitation, chest discomfort (+) Slight lower leg edema Gastrointestinal:  Denies nausea, heartburn or change in bowel habits (+) right side abdominal pain (+) feels knots under right ribcage  MSK: (+) back pain Breast: (+) Still have expander in left breast, has pain/tenderness in left breast Skin: Denies  abnormal skin rashes Lymphatics: Denies new lymphadenopathy or easy bruising (+) thyroid nodule palpable  Neurological:Denies numbness, tingling or new weaknesses Behavioral/Psych: Mood is stable, no new changes  All other systems were reviewed with the patient and are negative.  MEDICAL HISTORY:  Past Medical History:  Diagnosis Date  . Anxiety   . Arthritis    hands, knees, hips  . Asthma   . Cancer ([place])   . Cervical stenosis (uterine cervix)   . Dyspnea on exertion   . GERD (gastroesophageal reflux disease)   . Headache(784.0)   . History of breast cancer    2010--  LEFT  s/p  mastectomy (in [place]) AND CHEMORADIATION--  NO RECURRENCE  . History of cervical dysplasia   . History of  TB skin testing    [place] TEEN--  [place]/ MEDS  . Hyperlipidemia   . Hypertension   . Malignant neoplasm of overlapping sites of left breast in female, estrogen receptor positive ([place]) 03/05/2013  . OSA (obstructive sleep apnea) moderate osa per study  09/2010   CPAP  , NOT USING ON REGULAR BASIS  . Pelvic pain in female   . Positive H. pylori test    08-05-2013  . Refusal of blood transfusions as patient is Jehovah's Witness   . Seasonal allergies   . Uterine fibroid   . Wears glasses     SURGICAL HISTORY: Past Surgical History:  Procedure Laterality Date  . CARDIOVASCULAR STRESS TEST  10-29-2012   low risk perfusion study/  no significant reversibity/ ef 66%/  normal wall motion  . CERVICAL CONIZATION W/BX  2012   in  [place] N/A 05/14/2012   Procedure: LAPAROSCOPIC CHOLECYSTECTOMY;  Surgeon: Ralene Ok, MD;  Location: [facility];  Service: General;  Laterality: N/A;  . COLONOSCOPY  2011   normal per patient - [place]  . DILATION AND CURETTAGE OF UTERUS  10/15/2011   Procedure: DILATATION AND CURETTAGE;  Surgeon: Melina Schools, MD;  Location: [facility];  Service: Gynecology;  Laterality: N/A;  Conization &  endocervical curettings  . EXAMINATION UNDER ANESTHESIA N/A 08/20/2013    Procedure: EXAM UNDER ANESTHESIA;  Surgeon: Margarette Asal, MD;  Location: [facility];  Service: Gynecology;  Laterality: N/A;  . [place]   right  . LAPAROSCOPIC ASSISTED VAGINAL HYSTERECTOMY N/A 10/26/2014   Procedure: HYSTERECTOMY ABDOMINAL ;  Surgeon: Molli Posey, MD;  Location: [facility];  Service: Gynecology;  Laterality: N/A;  . MASTECTOMY Left 11/2008  in [place]   . SALPINGOOPHORECTOMY Bilateral 10/26/2014   Procedure: BILATERAL SALPINGO OOPHORECTOMY;  Surgeon: Molli Posey, MD;  Location: [facility];  Service: Gynecology;  Laterality: Bilateral;  . TRANSTHORACIC ECHOCARDIOGRAM  10-29-2012   mild lvh/  ef 60-65%/  grade II diastolic dysfunction/  trivial mr  &  tr    I have reviewed the social history and family history with the patient and they are unchanged from previous note.  ALLERGIES:  is allergic to lisinopril-hydrochlorothiazide; adhesive [tape]; fentanyl; hctz [hydrochlorothiazide]; latex; lisinopril; losartan potassium; and oxycodone.  MEDICATIONS:  Current Outpatient Medications  Medication Sig Dispense Refill  . albuterol (PROVENTIL HFA;VENTOLIN HFA) 108 (90 Base) MCG/ACT inhaler Inhale 2 puffs into the lungs every 6 (six) hours as needed for wheezing or shortness of breath.    Marland Kitchen amLODipine (NORVASC) 10 MG tablet Take 1 tablet (10 mg total) by mouth daily. 90 tablet 3  . carvedilol (COREG) 6.25 MG tablet Take 1 tablet (6.25 mg total) by mouth 2 (two) times daily with a meal. 180 tablet 3  . famotidine (PEPCID) 20 MG tablet Take 1 TABLET at bedtime 30 tablet 2  . fluticasone (FLOVENT HFA) 110 MCG/ACT inhaler Inhale 2 puffs into the lungs 2 (two) times daily. With spacer 1 Inhaler 5  . furosemide (LASIX) 20 MG tablet Take 1 tablet (20 mg total) by mouth daily. 90 tablet 3  . loratadine (CLARITIN) 10 MG tablet Take 10 mg by mouth daily.    . naproxen (NAPROSYN) 500 MG tablet Take 1 tablet (500 mg total) by mouth 2 (two) times daily  with a meal. 30 tablet 0  . pantoprazole (PROTONIX) 40 MG tablet Take 1 tablet (40 mg total) by mouth daily.  Take 30-60 min before first meal of the day 30 tablet 2  . promethazine (PHENERGAN) 25 MG tablet Take 1 tablet (25 mg total) by mouth every 6 (six) hours as needed for nausea or vomiting. 30 tablet 0  . ranitidine (ZANTAC) 300 MG tablet Take 1 tablet (300 mg total) by mouth at bedtime. 30 tablet 5  . tobramycin (TOBREX) 0.3 % ophthalmic solution Place 2 drops into both eyes every 4 (four) hours. 5 mL 0  . exemestane (AROMASIN) 25 MG tablet Take 1 tablet (25 mg total) by mouth daily after breakfast. 90 tablet 1  . palbociclib (IBRANCE) 75 MG capsule TAKE 1 CAPSULE (75MG) BY MOUTH DAILY. TAKE WHOLE WITH FOOD. (Patient not taking: Reported on 07/08/2017) 21 capsule 2   No current facility-administered medications for this visit.     PHYSICAL EXAMINATION: ECOG PERFORMANCE STATUS: 3  Vitals:   07/08/17 1316  BP: 136/71  Pulse: 67  Resp: 18  Temp: 97.7 F (36.5 C)  SpO2: 98%   Filed Weights   07/08/17 1316  Weight: 288 lb 14.4 oz (131 kg)    GENERAL:alert, no distress and comfortable SKIN: skin color, texture, turgor are normal, no rashes or significant lesions EYES: normal, Conjunctiva are pink and non-injected, sclera clear OROPHARYNX:no exudate, no erythema and lips, buccal mucosa, and tongue normal  NECK: supple, thyroid normal size, non-tender, (+)palpable right thyroid nodule  LYMPH:  no palpable lymphadenopathy in the cervical, axillary or inguinal LUNGS: clear to auscultation and percussion with normal breathing effort HEART: regular rate & rhythm and no murmurs and no lower extremity edema ABDOMEN:abdomen soft, non-tender and normal bowel sounds (+) significant tenderness in right lateral rib cage, mild tenderness in left lateral rib, abdominal exam negative.  Musculoskeletal:no cyanosis of digits and no clubbing  NEURO: alert & oriented x 3 with fluent speech, no focal  motor/sensory deficits Breast: (+) S/p left mastectomy: She has mild tenderness around the tissue expander, no palpable mass or adenopathy. right breast exam negative    LABORATORY DATA:  I have reviewed the data as listed CBC Latest Ref Rng & Units 07/08/2017 06/13/2017 06/10/2017  WBC 3.9 - 10.3 K/uL 6.0 3.9(L) 2.9(L)  Hemoglobin 11.6 - 15.9 g/dL 12.4 12.3 11.7  Hematocrit 34.8 - 46.6 % 37.3 37.0 34.5(L)  Platelets 145 - 400 K/uL 225 277 250     CMP Latest Ref Rng & Units 07/08/2017 06/13/2017 06/10/2017  Glucose 70 - 140 mg/dL 121 95 152(H)  BUN 7 - 26 mg/dL 7 15 14   Creatinine 0.60 - 1.10 mg/dL 0.79 0.74 0.77  Sodium 136 - 145 mmol/L 140 141 140  Potassium 3.5 - 5.1 mmol/L 3.7 4.0 3.8  Chloride 98 - 109 mmol/L 107 107 109  CO2 22 - 29 mmol/L 24 25 25   Calcium 8.4 - 10.4 mg/dL 9.0 8.9 9.2  Total Protein 6.4 - 8.3 g/dL 7.2 7.6 7.0  Total Bilirubin 0.2 - 1.2 mg/dL 0.9 0.9 1.0  Alkaline Phos 40 - 150 U/L 110 90 81  AST 5 - 34 U/L 14 20 13   ALT 0 - 55 U/L 10 11(L) 8   PROCEDURES  ECHO 01/22/17 Study Conclusions - Left ventricle: The cavity size was normal. There was moderate concentric hypertrophy. Systolic function was normal. The estimated ejection fraction was in the range of 55% to 60%. Wall motion was normal; there were no regional wall motion abnormalities. Doppler parameters are consistent with abnorma left ventricular relaxation (grade 1 diastolic dysfunction).   RADIOGRAPHIC STUDIES: I  have personally reviewed the radiological images as listed and agreed with the findings in the report. No results found.    CT AP W Contrast 02/07/17 IMPRESSION: 1. No CT findings for abdominal/pelvic metastatic disease. 2. No acute abdominal findings, mass lesions or adenopathy. 3. Status post cholecystectomy with mild associated common bile duct dilatation. 4. Somewhat thickened appendix appears relatively stable. No acute inflammatory process. 5. Stable mixed lytic and sclerotic  osseous metastatic disease.   Bone Scan Whole Body 02/07/17 IMPRESSION: No scintigraphic evidence skeletal metastasis    ASSESSMENT & PLAN:  Michaela Little is a 57 y.o. female with a history of HTN, HLD, Arthritis, Asthma, Sleep Apnea, and H. Pylori.   1. 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 -She has metastatic ER positive breast cancer to bones, has been on letrozole and Ibrance since March 2017. She has had her oncological care both in [place] and [place], she had a PET scan in December 2017 and  CT scan in April 2018. Due to her worsening bilateral rib cage pain, I obtained restaging CT abdomen and pelvis with contrast, and bone scan on 02/07/2017. I previously reviewed the scan findings with pt, which showed stable lipomatosis, not hypermetabolic on bone scan, no other new lesions.  No evidence of disease progression on the restaging scan -Given her increase b/l rib cage pain, possible related to her letrozole along with her bone mets, I switched her to Exemestane (02/13/17). Potential side effects reviewed with her again, she agreed to proceed. -She was initially supposed to have appendectomy on 07/11/17 and we advised her to stop Ibrance mid March. However, her surgery date was moved to 08/22/17 and she still has not restarted Ibrance and also stopped taking Exemestane. I strongly advised her today to restart Exemestane and continue indefinitely and restart Ibrance until her surgery. She can hold 7-14 days before surgery. She is reluctant to restart her medications because she feels much better off them, her right-sided rib cage pain has improved. I discussed that these medications are the best chance to control her disease and prolong her life. She agrees to restart exemestane, but does not want restart Ibrance at this point.   -Continue Xgeva monthly  -I will order a Whole  Body Bone Scan and a CT AP W Contrast for restaging to be done if 4 weeks. I will send a message to Dr. Ninfa Linden since he is performing her appendectomy on 08/22/17. She also mentioned she is planning on having Dr. Harlow Mares do her breast reconstruction surgery on the same day. -Labs reviewed, CBC and CMP are WNL. Her Ca 27.29 still pending  -F/u in 4 weeks after scans   2. Primary breast cancer of left breast overlapping sites diagnosed March of 2010 and treated neo-adjuvantly at [facility] with (a) paclitaxel weekly x12 and (b) doxorubicin/ cyclophosphamide in dose dense fashion x4, both given with tifiparnib.  -definitive left modified radical mastectomy September of 2010 for a T2 N2 or stage IIIA invasive lobular breast cancer, estrogen and progesterone receptor positive, HER-2 negative,  -post mastectomy radiation completed January of 2011, -briefly on tamoxifen, discontinued it because of uterine lining concerns.  On anastrozole since May 2012 with good tolerance. Held between November and December 2013 due to GI issues, unrelated to cancer or its treatment. Discontinued by the patient summer of 2016  3. Bilateral rib cage pain, Left Breast pain -Pain was 6-7/10 previously  -  She takes Tylenol and tramadol as needed -restaging scan did not show progression of bone metastasis previously -02/07/17 Bone scan showed no evidence of hypermetabolic bone metastasis.  -Bone pain has increased to bilateral ribs. She does not want to take narcotics and only takes tylenol as needed. I have switched her letrozole to Exemestane. Her pain has improved lately. -She previously declined narcotics, but I suggested she continue to take tylenol, advil or alieve for her pain as we want to help her control her pain.   4. Lower Extremity Edema -She is on Lasix, given by her PCP Dr. Altamease Oiler -I previously suggested compression socks and advices her to elevate her feet when sitting at home.  -Repeat echocardiogram in  12/2016 shows normal EF  5. Dyspnea  -Uses Flovent -Ordered ECHO to rule out cardiomyopathy, she had chemo Adramycin before which can cause heart failure  -01/22/17 ECHO showed normal EF 55-60%, and grade 1 diastolic dysfunction -slightly improved lately   6. Insomnia  -She is on hydroxyzine already  -I suggest her to try melatonin previously -She was given gabapentin in the past, I advised if it is not working for her she can stop.   7. Left breast implant rupture  -She plans to have implant replacement by Dr. Marica Otter soon  8. thickened Appendix  -This is incidental finding on her CT scan, stable since 07/2016 -Patient is very concerned, she was seen by surgeon, and plan to have appendicectomy soon with Dr. Ninfa Linden -surgery scheduled for 08/22/17  9. Goal of care discussion  -We discussed the incurable nature of her cancer, and the overall poor prognosis, especially if she does not have good response to chemotherapy or progress on chemo -The patient understands the goal of care is palliative. -she is full code now -She advised me that she is in the process of getting a home health aid. I recommend she also look into getting home physical therapy too.   PLAN:  -Xgeva injection today and continue monthly  -Restart exemestane, but pt declined restarting Ibrance  -CT AP W Contrast and Whole Body bone Scan in 4 weeks  -I will send message to Dr. Ninfa Linden about her upcoming scan  -I recommended today that she look into home physical therapy, she has home care service  -Lab and f/u after scans in 4 weeks   Orders Placed This Encounter  Procedures  . CT Abdomen Pelvis W Contrast    Standing Status:   Future    Standing Expiration Date:   07/08/2018    Order Specific Question:   If indicated for the ordered procedure, I authorize the administration of contrast media per Radiology protocol    Answer:   Yes    Order Specific Question:   Is patient pregnant?    Answer:   No    Order  Specific Question:   Preferred imaging location?    Answer:   [facility]    Order Specific Question:   Is Oral Contrast requested for this exam?    Answer:   Per Radiology protocol    Order Specific Question:   Radiology Contrast Protocol - do NOT remove file path    Answer:   \\[email or web address]  . NM Bone Scan Whole Body    Standing Status:   Future    Standing Expiration Date:   07/08/2018    Order Specific Question:   If indicated for the ordered procedure, I authorize the administration of a radiopharmaceutical per Radiology protocol  Answer:   Yes    Order Specific Question:   Is the patient pregnant?    Answer:   No    Order Specific Question:   Preferred imaging location?    Answer:   [facility]    Order Specific Question:   Radiology Contrast Protocol - do NOT remove file path    Answer:   \\[email or web address]   All questions were answered. The patient knows to call the clinic with any problems, questions or concerns. No barriers to learning was detected.  I spent 20 minutes counseling the patient face to face. The total time spent in the appointment was 25 minutes and more than 50% was on counseling and review of test results  This document serves as a record of services personally performed by [name], MD. It was created on her behalf by Theresia Bough, a trained medical scribe. The creation of this record is based on the scribe's personal observations and the provider's statements to them.   I have reviewed the above documentation for accuracy and completeness, and I agree with the above.    [name], MD 07/08/2017 4:24 PM

## 2017-07-08 ENCOUNTER — Other Ambulatory Visit (INDEPENDENT_AMBULATORY_CARE_PROVIDER_SITE_OTHER): Payer: Self-pay | Admitting: Orthopaedic Surgery

## 2017-07-08 ENCOUNTER — Inpatient Hospital Stay: Payer: Medicare Other | Attending: Hematology

## 2017-07-08 ENCOUNTER — Telehealth: Payer: Self-pay

## 2017-07-08 ENCOUNTER — Other Ambulatory Visit: Payer: Self-pay | Admitting: Allergy and Immunology

## 2017-07-08 ENCOUNTER — Other Ambulatory Visit: Payer: Self-pay | Admitting: Surgery

## 2017-07-08 ENCOUNTER — Other Ambulatory Visit (INDEPENDENT_AMBULATORY_CARE_PROVIDER_SITE_OTHER): Payer: Self-pay | Admitting: Physician Assistant

## 2017-07-08 ENCOUNTER — Other Ambulatory Visit: Payer: Self-pay | Admitting: Hematology

## 2017-07-08 ENCOUNTER — Inpatient Hospital Stay: Payer: Medicare Other

## 2017-07-08 ENCOUNTER — Encounter: Payer: Self-pay | Admitting: Hematology

## 2017-07-08 ENCOUNTER — Inpatient Hospital Stay (HOSPITAL_BASED_OUTPATIENT_CLINIC_OR_DEPARTMENT_OTHER): Payer: Medicare Other | Admitting: Hematology

## 2017-07-08 VITALS — BP 136/71 | HR 67 | Temp 97.7°F | Resp 18 | Ht 67.0 in | Wt 288.9 lb

## 2017-07-08 DIAGNOSIS — K389 Disease of appendix, unspecified: Secondary | ICD-10-CM

## 2017-07-08 DIAGNOSIS — C50912 Malignant neoplasm of unspecified site of left female breast: Secondary | ICD-10-CM

## 2017-07-08 DIAGNOSIS — C7951 Secondary malignant neoplasm of bone: Secondary | ICD-10-CM

## 2017-07-08 DIAGNOSIS — Z170 Estrogen receptor positive status [ER+]: Secondary | ICD-10-CM

## 2017-07-08 DIAGNOSIS — I10 Essential (primary) hypertension: Secondary | ICD-10-CM

## 2017-07-08 DIAGNOSIS — R0781 Pleurodynia: Secondary | ICD-10-CM

## 2017-07-08 DIAGNOSIS — Z7189 Other specified counseling: Secondary | ICD-10-CM | POA: Diagnosis not present

## 2017-07-08 DIAGNOSIS — N644 Mastodynia: Secondary | ICD-10-CM

## 2017-07-08 DIAGNOSIS — C50812 Malignant neoplasm of overlapping sites of left female breast: Secondary | ICD-10-CM

## 2017-07-08 LAB — COMPREHENSIVE METABOLIC PANEL
ALBUMIN: 3.5 g/dL (ref 3.5–5.0)
ALT: 10 U/L (ref 0–55)
ANION GAP: 9 (ref 3–11)
AST: 14 U/L (ref 5–34)
Alkaline Phosphatase: 110 U/L (ref 40–150)
BILIRUBIN TOTAL: 0.9 mg/dL (ref 0.2–1.2)
BUN: 7 mg/dL (ref 7–26)
CHLORIDE: 107 mmol/L (ref 98–109)
CO2: 24 mmol/L (ref 22–29)
Calcium: 9 mg/dL (ref 8.4–10.4)
Creatinine, Ser: 0.79 mg/dL (ref 0.60–1.10)
GFR calc Af Amer: >60 mL/min (ref >60)
GFR calc non Af Amer: >60 mL/min (ref >60)
GLUCOSE: 121 mg/dL (ref 70–140)
POTASSIUM: 3.7 mmol/L (ref 3.5–5.1)
SODIUM: 140 mmol/L (ref 136–145)
TOTAL PROTEIN: 7.2 g/dL (ref 6.4–8.3)

## 2017-07-08 LAB — CBC WITH DIFFERENTIAL/PLATELET
BASOS ABS: 0 10*3/uL (ref 0.0–0.1)
Basophils Relative: 0 %
Eosinophils Absolute: 0.4 10*3/uL (ref 0.0–0.5)
Eosinophils Relative: 7 %
HEMATOCRIT: 37.3 % (ref 34.8–46.6)
Hemoglobin: 12.4 g/dL (ref 11.6–15.9)
LYMPHS ABS: 1.2 10*3/uL (ref 0.9–3.3)
LYMPHS PCT: 19 %
MCH: 29.7 pg (ref 25.1–34.0)
MCHC: 33.2 g/dL (ref 31.5–36.0)
MCV: 89.4 fL (ref 79.5–101.0)
MONO ABS: 0.5 10*3/uL (ref 0.1–0.9)
Monocytes Relative: 9 %
NEUTROS ABS: 3.9 10*3/uL (ref 1.5–6.5)
Neutrophils Relative %: 65 %
Platelets: 225 10*3/uL (ref 145–400)
RBC: 4.17 MIL/uL (ref 3.70–5.45)
RDW: 14 % (ref 11.2–14.5)
WBC: 6 10*3/uL (ref 3.9–10.3)

## 2017-07-08 MED ORDER — EXEMESTANE 25 MG PO TABS: 25.0000 mg | ORAL_TABLET | Freq: Every day | ORAL | 1 refills | Status: DC

## 2017-07-08 MED ORDER — DENOSUMAB 120 MG/1.7ML ~~LOC~~ SOLN
120.0000 mg | Freq: Once | SUBCUTANEOUS | Status: AC
  Administered 2017-07-08: 120 mg via SUBCUTANEOUS

## 2017-07-08 MED ORDER — DENOSUMAB 120 MG/1.7ML ~~LOC~~ SOLN
SUBCUTANEOUS | Status: AC
  Filled 2017-07-08: qty 1.7

## 2017-07-08 NOTE — Patient Instructions (Signed)
Denosumab injection  What is this medicine?  DENOSUMAB (den oh sue mab) slows bone breakdown. Prolia is used to treat osteoporosis in women after menopause and in men. Xgeva is used to prevent bone fractures and other bone problems caused by cancer bone metastases. Xgeva is also used to treat giant cell tumor of the bone.  This medicine may be used for other purposes; ask your health care provider or pharmacist if you have questions.  What should I tell my health care provider before I take this medicine?  They need to know if you have any of these conditions:  -dental disease  -eczema  -infection or history of infections  -kidney disease or on dialysis  -low blood calcium or vitamin D  -malabsorption syndrome  -scheduled to have surgery or tooth extraction  -taking medicine that contains denosumab  -thyroid or parathyroid disease  -an unusual reaction to denosumab, other medicines, foods, dyes, or preservatives  -pregnant or trying to get pregnant  -breast-feeding  How should I use this medicine?  This medicine is for injection under the skin. It is given by a health care professional in a hospital or clinic setting.  If you are getting Prolia, a special MedGuide will be given to you by the pharmacist with each prescription and refill. Be sure to read this information carefully each time.  For Prolia, talk to your pediatrician regarding the use of this medicine in children. Special care may be needed. For Xgeva, talk to your pediatrician regarding the use of this medicine in children. While this drug may be prescribed for children as young as 13 years for selected conditions, precautions do apply.  Overdosage: If you think you have taken too much of this medicine contact a poison control center or emergency room at once.  NOTE: This medicine is only for you. Do not share this medicine with others.  What if I miss a dose?  It is important not to miss your dose. Call your doctor or health care professional if you are  unable to keep an appointment.  What may interact with this medicine?  Do not take this medicine with any of the following medications:  -other medicines containing denosumab  This medicine may also interact with the following medications:  -medicines that suppress the immune system  -medicines that treat cancer  -steroid medicines like prednisone or cortisone  This list may not describe all possible interactions. Give your health care provider a list of all the medicines, herbs, non-prescription drugs, or dietary supplements you use. Also tell them if you smoke, drink alcohol, or use illegal drugs. Some items may interact with your medicine.  What should I watch for while using this medicine?  Visit your doctor or health care professional for regular checks on your progress. Your doctor or health care professional may order blood tests and other tests to see how you are doing.  Call your doctor or health care professional if you get a cold or other infection while receiving this medicine. Do not treat yourself. This medicine may decrease your body's ability to fight infection.  You should make sure you get enough calcium and vitamin D while you are taking this medicine, unless your doctor tells you not to. Discuss the foods you eat and the vitamins you take with your health care professional.  See your dentist regularly. Brush and floss your teeth as directed. Before you have any dental work done, tell your dentist you are receiving this medicine.  Do   not become pregnant while taking this medicine or for 5 months after stopping it. Women should inform their doctor if they wish to become pregnant or think they might be pregnant. There is a potential for serious side effects to an unborn child. Talk to your health care professional or pharmacist for more information.  What side effects may I notice from receiving this medicine?  Side effects that you should report to your doctor or health care professional as soon as  possible:  -allergic reactions like skin rash, itching or hives, swelling of the face, lips, or tongue  -breathing problems  -chest pain  -fast, irregular heartbeat  -feeling faint or lightheaded, falls  -fever, chills, or any other sign of infection  -muscle spasms, tightening, or twitches  -numbness or tingling  -skin blisters or bumps, or is dry, peels, or red  -slow healing or unexplained pain in the mouth or jaw  -unusual bleeding or bruising  Side effects that usually do not require medical attention (Report these to your doctor or health care professional if they continue or are bothersome.):  -muscle pain  -stomach upset, gas  This list may not describe all possible side effects. Call your doctor for medical advice about side effects. You may report side effects to FDA at 1-800-FDA-1088.  Where should I keep my medicine?  This medicine is only given in a clinic, doctor's office, or other health care setting and will not be stored at home.  NOTE: This sheet is a summary. It may not cover all possible information. If you have questions about this medicine, talk to your doctor, pharmacist, or health care provider.      2016, [employer]vier/Gold Standard. (2011-09-10 12:37:47)

## 2017-07-08 NOTE — Telephone Encounter (Signed)
Printed avs and calender of upcoming appointment. Per 4/15 los 

## 2017-07-08 NOTE — Progress Notes (Signed)
Completed on 07/08/2017

## 2017-07-08 NOTE — Telephone Encounter (Signed)
Patient refill request. Please refill if appropriate.

## 2017-07-09 LAB — CANCER ANTIGEN 27.29: CA 27.29: 26.6 U/mL (ref 0.0–38.6)

## 2017-07-29 ENCOUNTER — Encounter (HOSPITAL_COMMUNITY): Payer: Medicare Other

## 2017-07-29 ENCOUNTER — Ambulatory Visit (HOSPITAL_COMMUNITY): Admission: RE | Admit: 2017-07-29 | Payer: Medicare Other | Source: Ambulatory Visit

## 2017-07-31 ENCOUNTER — Other Ambulatory Visit: Payer: Self-pay

## 2017-07-31 DIAGNOSIS — C50812 Malignant neoplasm of overlapping sites of left female breast: Secondary | ICD-10-CM

## 2017-07-31 DIAGNOSIS — Z170 Estrogen receptor positive status [ER+]: Principal | ICD-10-CM

## 2017-07-31 MED ORDER — PROMETHAZINE HCL 25 MG PO TABS: 25.0000 mg | ORAL_TABLET | Freq: Four times a day (QID) | ORAL | 0 refills | Status: DC | PRN

## 2017-08-06 ENCOUNTER — Telehealth: Payer: Self-pay

## 2017-08-06 ENCOUNTER — Inpatient Hospital Stay: Payer: Medicare Other | Attending: Hematology

## 2017-08-06 DIAGNOSIS — Z7189 Other specified counseling: Secondary | ICD-10-CM | POA: Insufficient documentation

## 2017-08-06 DIAGNOSIS — R1031 Right lower quadrant pain: Secondary | ICD-10-CM | POA: Diagnosis not present

## 2017-08-06 DIAGNOSIS — C7951 Secondary malignant neoplasm of bone: Secondary | ICD-10-CM | POA: Insufficient documentation

## 2017-08-06 DIAGNOSIS — B370 Candidal stomatitis: Secondary | ICD-10-CM | POA: Diagnosis not present

## 2017-08-06 DIAGNOSIS — R309 Painful micturition, unspecified: Secondary | ICD-10-CM | POA: Diagnosis not present

## 2017-08-06 DIAGNOSIS — C50812 Malignant neoplasm of overlapping sites of left female breast: Secondary | ICD-10-CM | POA: Insufficient documentation

## 2017-08-06 DIAGNOSIS — I10 Essential (primary) hypertension: Secondary | ICD-10-CM | POA: Diagnosis not present

## 2017-08-06 DIAGNOSIS — C50912 Malignant neoplasm of unspecified site of left female breast: Secondary | ICD-10-CM

## 2017-08-06 LAB — CBC WITH DIFFERENTIAL/PLATELET
BASOS ABS: 0 10*3/uL (ref 0.0–0.1)
BASOS PCT: 1 %
EOS PCT: 4 %
Eosinophils Absolute: 0.2 10*3/uL (ref 0.0–0.5)
HCT: 38.7 % (ref 34.8–46.6)
Hemoglobin: 12.8 g/dL (ref 11.6–15.9)
Lymphocytes Relative: 20 %
Lymphs Abs: 1.1 10*3/uL (ref 0.9–3.3)
MCH: 28.9 pg (ref 25.1–34.0)
MCHC: 33.2 g/dL (ref 31.5–36.0)
MCV: 87 fL (ref 79.5–101.0)
MONO ABS: 0.4 10*3/uL (ref 0.1–0.9)
MONOS PCT: 8 %
NEUTROS ABS: 3.5 10*3/uL (ref 1.5–6.5)
Neutrophils Relative %: 67 %
PLATELETS: 277 10*3/uL (ref 145–400)
RBC: 4.44 MIL/uL (ref 3.70–5.45)
RDW: 14.2 % (ref 11.2–14.5)
WBC: 5.2 10*3/uL (ref 3.9–10.3)

## 2017-08-06 LAB — COMPREHENSIVE METABOLIC PANEL
ALBUMIN: 3.9 g/dL (ref 3.5–5.0)
ALT: 15 U/L (ref 0–55)
ANION GAP: 7 (ref 3–11)
AST: 20 U/L (ref 5–34)
Alkaline Phosphatase: 99 U/L (ref 40–150)
BILIRUBIN TOTAL: 0.8 mg/dL (ref 0.2–1.2)
BUN: 15 mg/dL (ref 7–26)
CHLORIDE: 107 mmol/L (ref 98–109)
CO2: 24 mmol/L (ref 22–29)
Calcium: 9.4 mg/dL (ref 8.4–10.4)
Creatinine, Ser: 0.8 mg/dL (ref 0.60–1.10)
GFR calc Af Amer: >60 mL/min (ref >60)
GFR calc non Af Amer: >60 mL/min (ref >60)
GLUCOSE: 110 mg/dL (ref 70–140)
POTASSIUM: 4 mmol/L (ref 3.5–5.1)
SODIUM: 138 mmol/L (ref 136–145)
TOTAL PROTEIN: 7.8 g/dL (ref 6.4–8.3)

## 2017-08-06 NOTE — Telephone Encounter (Signed)
Patient called to confirm MD appointment for 5/14. RN made patient aware of lab appointment on 5/14 at 11am and MD appointment with injection on 08/08/17 at [facility] [facility] [address]. Patient verbalized understanding.

## 2017-08-07 LAB — CANCER ANTIGEN 27.29: CA 27.29: 46.9 U/mL — ABNORMAL HIGH (ref 0.0–38.6)

## 2017-08-07 NOTE — Progress Notes (Signed)
[facility]  Telephone:([phone]) [phone] [phone]  Clinic Follow up Note   Patient Care Team: [name] as PCP - General (Physician Assistant) [name], MD as Consulting Physician (Pulmonary Disease) [name], MD as Consulting Physician (Hematology)   Date of Service:  08/08/2017   CHIEF COMPLAINT: F/U metastatic breast cancer   SUMMARY OF ONCOLOGIC HISTORY: Oncology History   Cancer Staging Malignant neoplasm of overlapping sites of left breast in female, estrogen receptor positive ([place]) Staging form: Breast, AJCC 7th Edition - Clinical: Stage IIIA (T2, N2, M0) - Unsigned       Malignant neoplasm of overlapping sites of left breast in female, estrogen receptor positive ([place])   05/2008 Cancer Diagnosis    Left sided breast cancer (no path report available)      11/2008 Pathologic Stage    Stage IIIA: T2 N2       Neo-Adjuvant Chemotherapy    Paclitaxel weekly x 12; doxorubicin and cyclophosphamide x 4 - both given with trifiparnib (treated at [facility] in [place])      11/2008 Definitive Surgery    Left modified radiation mastectomy: invasive lobular carcinoma, ER+, PR+, HER2/neu negative, 5/22 LN positive       - 03/2009 Radiation Therapy    Adjuvant radiation to left chest wall      2011 - 08/2014 Anti-estrogen oral therapy    Tamoxifen 20 mg used briefly; discontinued due to uterine lining concerns; changed to anastrozole 1 mg daily (began 07/2100); discontinued by patient summer of 2016      05/26/2015 Progression    Iliac bone biopsy showed metastatic carcinoma consistent with a breast primary, ER positive, PR and HER-2 negative. Her staging CT, and a PET scan was negative for visceral metastasis.      05/2015 -  Anti-estrogen oral therapy    1. letrozole and Ibrance started March 2017             -Ibrance dose decreased to 75 mg daily  -Letrozole switched to Exemestane on 02/13/17 due to b/l rib pain.       11/16/2015 Miscellaneous    Xgeva monthly for bone metastasis       07/03/2016 Imaging    CT chest, abdomen and pelvis with contrast showed no evidence of metastasis.       02/07/2017 Imaging    CT AP W Contrast 02/07/17 IMPRESSION: 1. No CT findings for abdominal/pelvic metastatic disease. 2. No acute abdominal findings, mass lesions or adenopathy. 3. Status post cholecystectomy with mild associated common bile duct dilatation. 4. Somewhat thickened appendix appears relatively stable. No acute inflammatory process. 5. Stable mixed lytic and sclerotic osseous metastatic disease.       02/07/2017 Imaging    Bone Scan Whole Body 02/07/17 IMPRESSION: No scintigraphic evidence skeletal metastasis        INTERIM HISTORY: 01/17/17 Michaela Little is here for a follow up of her metastatic breast cancer. I am taking over her care from Dr. Jana Hakim. This is my first time seeing her.   Today she is on her off week of Ibrance and is on 60m. She notes she feels exhausted and is often SOB. She has been having right sided abdominal pain that was worse 2 weeks ago, she say symptom management clinic and ER. She has been having ongoing pain in the right groin area but worsened lately. She is also having pain around her left lower ribs. She has not tried gabapentin and she takes  tramadol as needed for pain. Right now her pain is tolerable. Sometime her pain can be past 10/10 but now the pain is 6-7/10 She is taking her BP medication for her HTN, she started Flovent due to her current trouble breathing, given by Dr. Melvyn Novas. Her legs swell but no doppler has been done before. She also gets a lot of fluid in her stomach. She is also on Lasix, given by her PCP, Dr. Altamease Oiler. She has arthritis. She still has tissue expander in left breast where she has some pain and tenderness. Her PCP changed her heart medication because her heart rate was slow when he last saw her. She would like to get to the bottom of her  issues as she has difficulty falling asleep at night.  She lives by herself in a community residence. She lives in [place] but still also have doctors and family in [place] as well. She has 5 children. She does not drive. She does have SCAT forms to be filled out.    CURRENT TREATMENT:   1. letrozole and Ibrance started March 2017   -Ibrance dose decreased to 75 mg daily, held on 06/03/2017 due to pending surgery, pt refused to restart, but agreed to restart on 08/12/17    -Letrozole switched to Exemestane on 02/13/17 due to b/l rib pain.  2. Xgeva started 11/16/2015, repeated monthly   INTERVAL HISTORY:  Lien Michaela Little is here for a follow up of her metastatic breast cancer. She presents to the clinic today by herself. She has been doing well overall. She notes that she recently traveled to [place] to visit family, which she enjoyed.   She is on letrozole, with good tolerance. She is not taking Ibrance at this time.   Labs from 08/06/2017 show CBC and CMP WNL. CA 27.29 at 46.9.   She is scheduled for an appendectomy on 08/22/2017.   On review of systems, she reports RLQ pain, burning sensation in the middle of her stream, BLE pain and swelling, spotting x 1 episode, throat closing sensation with swallowing. She has a hx of UTI's, however she hasn't recently been evaluated. she denies any other symptoms. Pertinent positives are listed and detailed within the above HPI.   REVIEW OF SYSTEMS:   Constitutional: Denies fevers, chills or abnormal weight loss  Eyes: Denies blurriness of vision Ears, nose, mouth, throat, and face: Denies mucositis, sore throat  Respiratory: Denies cough or wheezes (+) SOB  Cardiovascular: Denies palpitation, chest discomfort (+) Slight lower leg edema Gastrointestinal:  Denies nausea, heartburn or change in bowel habits (+) right side abdominal pain (+) feels knots under right ribcage  MSK: (+) back pain Breast: (+) Still have expander in left breast, has  pain/tenderness in left breast Skin: Denies abnormal skin rashes Lymphatics: Denies new lymphadenopathy or easy bruising (+) thyroid nodule palpable  Neurological:Denies numbness, tingling or new weaknesses Behavioral/Psych: Mood is stable, no new changes  All other systems were reviewed with the patient and are negative.  MEDICAL HISTORY:  Past Medical History:  Diagnosis Date  . Anxiety   . Arthritis    hands, knees, hips  . Asthma   . Cancer ([place])   . Cervical stenosis (uterine cervix)   . Dyspnea on exertion   . GERD (gastroesophageal reflux disease)   . Headache(784.0)   . History of breast cancer    2010--  LEFT  s/p  mastectomy (in [place]) AND CHEMORADIATION--  NO RECURRENCE  . History of cervical dysplasia   .  History of TB skin testing    AS TEEN--  [place]/ MEDS  . Hyperlipidemia   . Hypertension   . Malignant neoplasm of overlapping sites of left breast in female, estrogen receptor positive ([place]) 03/05/2013  . OSA (obstructive sleep apnea) moderate osa per study  09/2010   CPAP  , NOT USING ON REGULAR BASIS  . Pelvic pain in female   . Positive H. pylori test    08-05-2013  . Refusal of blood transfusions as patient is Jehovah's Witness   . Seasonal allergies   . Uterine fibroid   . Wears glasses     SURGICAL HISTORY: Past Surgical History:  Procedure Laterality Date  . CARDIOVASCULAR STRESS TEST  10-29-2012   low risk perfusion study/  no significant reversibity/ ef 66%/  normal wall motion  . CERVICAL CONIZATION W/BX  2012   in  [place] N/A 05/14/2012   Procedure: LAPAROSCOPIC CHOLECYSTECTOMY;  Surgeon: Ralene Ok, MD;  Location: [facility];  Service: General;  Laterality: N/A;  . COLONOSCOPY  2011   normal per patient - [place]  . DILATION AND CURETTAGE OF UTERUS  10/15/2011   Procedure: DILATATION AND CURETTAGE;  Surgeon: Melina Schools, MD;  Location: [facility];  Service: Gynecology;  Laterality: N/A;  Conization &  endocervical curettings  .  EXAMINATION UNDER ANESTHESIA N/A 08/20/2013   Procedure: EXAM UNDER ANESTHESIA;  Surgeon: Margarette Asal, MD;  Location: [facility];  Service: Gynecology;  Laterality: N/A;  . [place]   right  . LAPAROSCOPIC ASSISTED VAGINAL HYSTERECTOMY N/A 10/26/2014   Procedure: HYSTERECTOMY ABDOMINAL ;  Surgeon: Molli Posey, MD;  Location: [facility];  Service: Gynecology;  Laterality: N/A;  . MASTECTOMY Left 11/2008  in [place]   . SALPINGOOPHORECTOMY Bilateral 10/26/2014   Procedure: BILATERAL SALPINGO OOPHORECTOMY;  Surgeon: Molli Posey, MD;  Location: [facility];  Service: Gynecology;  Laterality: Bilateral;  . TRANSTHORACIC ECHOCARDIOGRAM  10-29-2012   mild lvh/  ef 60-65%/  grade II diastolic dysfunction/  trivial mr  &  tr    I have reviewed the social history and family history with the patient and they are unchanged from previous note.  ALLERGIES:  is allergic to lisinopril-hydrochlorothiazide; adhesive [tape]; fentanyl; hctz [hydrochlorothiazide]; latex; lisinopril; losartan potassium; other; and oxycodone.  MEDICATIONS:  Current Outpatient Medications  Medication Sig Dispense Refill  . albuterol (PROVENTIL HFA;VENTOLIN HFA) 108 (90 Base) MCG/ACT inhaler Inhale 2 puffs into the lungs every 6 (six) hours as needed for wheezing or shortness of breath.    Marland Kitchen amLODipine (NORVASC) 10 MG tablet Take 1 tablet (10 mg total) by mouth daily. 90 tablet 3  . carvedilol (COREG) 6.25 MG tablet Take 1 tablet (6.25 mg total) by mouth 2 (two) times daily with a meal. 180 tablet 3  . exemestane (AROMASIN) 25 MG tablet Take 1 tablet (25 mg total) by mouth daily after breakfast. 90 tablet 1  . famotidine (PEPCID) 20 MG tablet Take 1 TABLET at bedtime 30 tablet 2  . fluticasone (FLOVENT HFA) 110 MCG/ACT inhaler Inhale 2 puffs into the lungs 2 (two) times daily. With spacer 1 Inhaler 5  . furosemide (LASIX) 20 MG tablet Take 1 tablet (20 mg total) by mouth daily. 90 tablet 3    . loratadine (CLARITIN) 10 MG tablet Take 10 mg by mouth daily.    . naproxen (NAPROSYN) 500 MG tablet Take 1 tablet (500 mg total) by mouth 2 (  two) times daily with a meal. 30 tablet 0  . pantoprazole (PROTONIX) 40 MG tablet Take 1 tablet (40 mg total) by mouth daily. Take 30-60 min before first meal of the day 30 tablet 2  . promethazine (PHENERGAN) 25 MG tablet Take 1 tablet (25 mg total) by mouth every 6 (six) hours as needed for nausea or vomiting. 30 tablet 0  . ranitidine (ZANTAC) 300 MG tablet Take 1 tablet (300 mg total) by mouth at bedtime. 30 tablet 5  . tobramycin (TOBREX) 0.3 % ophthalmic solution Place 2 drops into both eyes every 4 (four) hours. 5 mL 0  . nystatin (MYCOSTATIN) 100000 UNIT/ML suspension Take 5 mLs (500,000 Units total) by mouth 4 (four) times daily. 473 mL 0  . palbociclib (IBRANCE) 75 MG capsule TAKE 1 CAPSULE (75MG) BY MOUTH DAILY. TAKE WHOLE WITH FOOD. 21 capsule 2   No current facility-administered medications for this visit.     PHYSICAL EXAMINATION:  ECOG PERFORMANCE STATUS: 3  Vitals:   08/08/17 0952  BP: 131/72  Pulse: 73  Resp: 17  Temp: 98.3 F (36.8 C)  SpO2: 97%   Filed Weights   08/08/17 0952  Weight: 284 lb 12.8 oz (129.2 kg)    GENERAL:alert, no distress and comfortable SKIN: skin color, texture, turgor are normal, no rashes or significant lesions EYES: normal, Conjunctiva are pink and non-injected, sclera clear OROPHARYNX:no exudate, no erythema and lips, buccal mucosa, and tongue normal  NECK: supple, thyroid normal size, non-tender, (+)palpable right thyroid nodule  LYMPH:  no palpable lymphadenopathy in the cervical, axillary or inguinal LUNGS: clear to auscultation and percussion with normal breathing effort HEART: regular rate & rhythm and no murmurs and no lower extremity edema ABDOMEN:abdomen soft, non-tender and normal bowel sounds (+) significant tenderness in right lateral rib cage, mild tenderness in left lateral rib,  abdominal exam negative.  Musculoskeletal:no cyanosis of digits and no clubbing  NEURO: alert & oriented x 3 with fluent speech, no focal motor/sensory deficits Breast: (+) S/p left mastectomy: She has mild tenderness around the tissue expander, no palpable mass or adenopathy. right breast exam negative    LABORATORY DATA:  I have reviewed the data as listed CBC Latest Ref Rng & Units 08/06/2017 07/08/2017 06/13/2017  WBC 3.9 - 10.3 K/uL 5.2 6.0 3.9(L)  Hemoglobin 11.6 - 15.9 g/dL 12.8 12.4 12.3  Hematocrit 34.8 - 46.6 % 38.7 37.3 37.0  Platelets 145 - 400 K/uL 277 225 277     CMP Latest Ref Rng & Units 08/06/2017 07/08/2017 06/13/2017  Glucose 70 - 140 mg/dL 110 121 95  BUN 7 - 26 mg/dL _0 Creatinine 0.60 - 1.10 mg/dL 0.80 0.79 0.74  Sodium 136 - 145 mmol/L 138 140 141  Potassium 3.5 - 5.1 mmol/L 4.0 3.7 4.0  Chloride 98 - 109 mmol/L 107 107 107  CO2 22 - 29 mmol/L _1 Calcium 8.4 - 10.4 mg/dL 9.4 9.0 8.9  Total Protein 6.4 - 8.3 g/dL 7.8 7.2 7.6  Total Bilirubin 0.2 - 1.2 mg/dL 0.8 0.9 0.9  Alkaline Phos 40 - 150 U/L 99 110 90  AST 5 - 34 U/L _2 ALT 0 - 55 U/L 15 10 11(L)   PROCEDURES  ECHO 01/22/17 Study Conclusions - Left ventricle: The cavity size was normal. There was moderate concentric hypertrophy. Systolic function was normal. The estimated ejection fraction was in the range of 55% to 60%. Wall motion was normal; there were no regional  wall motion abnormalities. Doppler parameters are consistent with abnorma left ventricular relaxation (grade 1 diastolic dysfunction).   RADIOGRAPHIC STUDIES: I have personally reviewed the radiological images as listed and agreed with the findings in the report. No results found.    CT AP W Contrast 02/07/17 IMPRESSION: 1. No CT findings for abdominal/pelvic metastatic disease. 2. No acute abdominal findings, mass lesions or adenopathy. 3. Status post cholecystectomy with mild associated common bile duct  dilatation. 4. Somewhat thickened appendix appears relatively stable. No acute inflammatory process. 5. Stable mixed lytic and sclerotic osseous metastatic disease.   Bone Scan Whole Body 02/07/17 IMPRESSION: No scintigraphic evidence skeletal metastasis    ASSESSMENT & PLAN:  Michaela Little is a 57 y.o. female with a history of HTN, HLD, Arthritis, Asthma, Sleep Apnea, and H. Pylori.   1. 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 -She has metastatic ER positive breast cancer to bones, has been on letrozole and Ibrance since March 2017. She has had her oncological care both in [place] and [place], she had a PET scan in December 2017 and  CT scan in April 2018. Due to her worsening bilateral rib cage pain, I obtained restaging CT abdomen and pelvis with contrast, and bone scan on 02/07/2017. I previously reviewed the scan findings with pt, which showed stable lipomatosis, not hypermetabolic on bone scan, no other new lesions.  No evidence of disease progression on the restaging scan -Given her increase b/l rib cage pain, possible related to her letrozole along with her bone mets, I switched her to Exemestane (02/13/17). Potential side effects reviewed with her again, she agreed to proceed. -She was initially supposed to have appendectomy on 07/11/17 and we advised her to stop Ibrance mid March. However, her surgery date was moved to 08/22/17 and she still has not restarted Ibrance and also stopped taking Exemestane. I strongly advised her today to restart Exemestane and continue indefinitely and restart Ibrance until her surgery. She can hold 7-14 days before surgery. She is reluctant to restart her medications because she feels much better off them, her right-sided rib cage pain has improved. I discussed that these medications are the best chance to control her disease and prolong her  life. She agrees to restart exemestane, but does not want restart Ibrance at this point.   -Due to her increasing tumor marker CA 27.29, I strongly encouraged her to restart Ibrance.  The potential benefits were reviewed with her again, she agrees to restart next Monday, Aug 12, 2017.  I will refill for her today 08/08/2017.  Due to her surgery which is scheduled for Aug 22, 2017, she will only take Ibrance for 1 week, then will hold for her surgery, and restart week after surgery. -Continue Xgeva monthly  -she is scheduled to have restaging Whole Body Bone Scan and a CT AP W Contrast next week before surgery. I will send a message to Dr. Ninfa Linden since he is performing her appendectomy on 08/22/17. She also mentioned she is planning on having Dr. Harlow Mares do her breast reconstruction surgery on the same day.  -Labs reviewed from 08/06/2017 and Labs from 08/06/2017 show CBC and CMP WNL. CA 27.29 at 46.9.  -Lab, f/u and injection in 4 weeks    2. Primary breast cancer of left breast overlapping sites diagnosed March of 2010 and treated [facility]-adjuvantly at [facility] with (a) paclitaxel weekly x12 and (b) doxorubicin/ cyclophosphamide  in dose dense fashion x4, both given with tifiparnib.  -definitive left modified radical mastectomy September of 2010 for a T2 N2 or stage IIIA invasive lobular breast cancer, estrogen and progesterone receptor positive, HER-2 negative,  -post mastectomy radiation completed January of 2011, -briefly on tamoxifen, discontinued it because of uterine lining concerns.  On anastrozole since May 2012 with good tolerance. Held between November and December 2013 due to GI issues, unrelated to cancer or its treatment. Discontinued by the patient summer of 2016  3. Bilateral rib cage pain, Left Breast pain -Pain was 6-7/10 previously  -She takes Tylenol and tramadol as needed -restaging scan did not show progression of bone metastasis previously -02/07/17 Bone scan showed no evidence of  hypermetabolic bone metastasis.  -Bone pain has increased to bilateral ribs. She does not want to take narcotics and only takes tylenol as needed. I have switched her letrozole to Exemestane. Her pain has improved lately. -She previously declined narcotics, but I suggested she continue to take tylenol, advil or aleve for her pain as we want to help her control her pain.   4. Lower Extremity Edema -She is on Lasix, given by her PCP Dr. Altamease Oiler -I previously suggested compression socks and advices her to elevate her feet when sitting at home.  -Repeat echocardiogram in 12/2016 shows normal EF  5. Dyspnea  -Uses Flovent -Ordered ECHO to rule out cardiomyopathy, she had chemo Adramycin before which can cause heart failure  -01/22/17 ECHO showed normal EF 55-60%, and grade 1 diastolic dysfunction -slightly improved lately   6. Insomnia  -She is on hydroxyzine already  -I suggested her to try melatonin previously -She was given gabapentin in the past, I previously advised if it is not working for her she can stop.   7. Left breast implant rupture  -She plans to have implant replacement by Dr. Marica Otter soon  8. thickened Appendix  -This is incidental finding on her CT scan, stable since 07/2016 -Patient is very concerned, she was seen by surgeon, and plan to have appendicectomy soon with Dr. Ninfa Linden -surgery scheduled for 08/22/17  9. Goal of care discussion  -We discussed the incurable nature of her cancer, and the overall poor prognosis, especially if she does not have good response to chemotherapy or progress on chemo -The patient understands the goal of care is palliative. -she is full code now -She advised me that she is in the process of getting a home health aid. I previously recommended that she also look into getting home physical therapy too.   10. Oral Thrush -Patient with white coat to tongue that is new -Will prescribe nystatin to aid with the patient symptoms.   11. Probable  UTI -Patient has lower abdominal pain with spotting and mild burning sensation mid stream urine -I will order a UA sample today to rule out UTI, if positive for UTI, then I will call in antibiotics.   PLAN:  -UA sample today to rule out UTI -Lab, f/u and injection in 4 weeks  -Xgeva injection today and continue monthly  -I refilled her Ibrance 75 mg today, she will start next Monday, May 20, for 1 week, then hold for surgery which is scheduled for May 30, she will restart Ibrance 1 week after surgery.  Orders Placed This Encounter  Procedures  . Urine Culture    Standing Status:   Future    Number of Occurrences:   1    Standing Expiration Date:   08/08/2018  . Urinalysis,  Complete w Microscopic    Standing Status:   Future    Number of Occurrences:   1    Standing Expiration Date:   08/09/2018   All questions were answered. The patient knows to call the clinic with any problems, questions or concerns. No barriers to learning was detected.  I spent 20 minutes counseling the patient face to face. The total time spent in the appointment was 25 minutes and more than 50% was on counseling and review of test results  This document serves as a record of services personally performed by [name], MD. It was created on her behalf by Steva Colder, a trained medical scribe. The creation of this record is based on the scribe's personal observations and the provider's statements to them.   I have reviewed the above documentation for accuracy and completeness, and I agree with the above.      [name], MD 08/08/2017 3:38 PM

## 2017-08-08 ENCOUNTER — Inpatient Hospital Stay: Payer: Medicare Other

## 2017-08-08 ENCOUNTER — Encounter: Payer: Self-pay | Admitting: Hematology

## 2017-08-08 ENCOUNTER — Inpatient Hospital Stay (HOSPITAL_BASED_OUTPATIENT_CLINIC_OR_DEPARTMENT_OTHER): Payer: Medicare Other | Admitting: Hematology

## 2017-08-08 ENCOUNTER — Telehealth: Payer: Self-pay | Admitting: Hematology

## 2017-08-08 VITALS — BP 131/72 | HR 73 | Temp 98.3°F | Resp 17 | Ht 67.0 in | Wt 284.8 lb

## 2017-08-08 DIAGNOSIS — N3000 Acute cystitis without hematuria: Secondary | ICD-10-CM

## 2017-08-08 DIAGNOSIS — Z170 Estrogen receptor positive status [ER+]: Secondary | ICD-10-CM

## 2017-08-08 DIAGNOSIS — I10 Essential (primary) hypertension: Secondary | ICD-10-CM

## 2017-08-08 DIAGNOSIS — C7951 Secondary malignant neoplasm of bone: Secondary | ICD-10-CM

## 2017-08-08 DIAGNOSIS — C50912 Malignant neoplasm of unspecified site of left female breast: Secondary | ICD-10-CM

## 2017-08-08 DIAGNOSIS — B370 Candidal stomatitis: Secondary | ICD-10-CM | POA: Diagnosis not present

## 2017-08-08 DIAGNOSIS — R1031 Right lower quadrant pain: Secondary | ICD-10-CM

## 2017-08-08 DIAGNOSIS — Z7189 Other specified counseling: Secondary | ICD-10-CM

## 2017-08-08 DIAGNOSIS — R309 Painful micturition, unspecified: Secondary | ICD-10-CM

## 2017-08-08 DIAGNOSIS — C50812 Malignant neoplasm of overlapping sites of left female breast: Secondary | ICD-10-CM

## 2017-08-08 LAB — URINALYSIS, COMPLETE (UACMP) WITH MICROSCOPIC
Bilirubin Urine: NEGATIVE
GLUCOSE, UA: NEGATIVE mg/dL
HGB URINE DIPSTICK: NEGATIVE
Ketones, ur: NEGATIVE mg/dL
Nitrite: NEGATIVE
Protein, ur: NEGATIVE mg/dL
SPECIFIC GRAVITY, URINE: 1.023 (ref 1.005–1.030)
pH: 5 (ref 5.0–8.0)

## 2017-08-08 MED ORDER — PALBOCICLIB 75 MG PO CAPS
ORAL_CAPSULE | ORAL | 2 refills | Status: DC
Start: 2017-08-08 — End: 2017-08-13

## 2017-08-08 MED ORDER — DENOSUMAB 120 MG/1.7ML ~~LOC~~ SOLN
120.0000 mg | Freq: Once | SUBCUTANEOUS | Status: AC
  Administered 2017-08-08: 120 mg via SUBCUTANEOUS

## 2017-08-08 MED ORDER — NYSTATIN 100000 UNIT/ML MT SUSP: 5.0000 mL | Freq: Four times a day (QID) | OROMUCOSAL | 0 refills | Status: DC

## 2017-08-08 MED ORDER — DENOSUMAB 120 MG/1.7ML ~~LOC~~ SOLN
SUBCUTANEOUS | Status: AC
  Filled 2017-08-08: qty 1.7

## 2017-08-08 NOTE — Patient Instructions (Signed)
Denosumab injection  What is this medicine?  DENOSUMAB (den oh sue mab) slows bone breakdown. Prolia is used to treat osteoporosis in women after menopause and in men. Xgeva is used to prevent bone fractures and other bone problems caused by cancer bone metastases. Xgeva is also used to treat giant cell tumor of the bone.  This medicine may be used for other purposes; ask your health care provider or pharmacist if you have questions.  What should I tell my health care provider before I take this medicine?  They need to know if you have any of these conditions:  -dental disease  -eczema  -infection or history of infections  -kidney disease or on dialysis  -low blood calcium or vitamin D  -malabsorption syndrome  -scheduled to have surgery or tooth extraction  -taking medicine that contains denosumab  -thyroid or parathyroid disease  -an unusual reaction to denosumab, other medicines, foods, dyes, or preservatives  -pregnant or trying to get pregnant  -breast-feeding  How should I use this medicine?  This medicine is for injection under the skin. It is given by a health care professional in a hospital or clinic setting.  If you are getting Prolia, a special MedGuide will be given to you by the pharmacist with each prescription and refill. Be sure to read this information carefully each time.  For Prolia, talk to your pediatrician regarding the use of this medicine in children. Special care may be needed. For Xgeva, talk to your pediatrician regarding the use of this medicine in children. While this drug may be prescribed for children as young as 13 years for selected conditions, precautions do apply.  Overdosage: If you think you have taken too much of this medicine contact a poison control center or emergency room at once.  NOTE: This medicine is only for you. Do not share this medicine with others.  What if I miss a dose?  It is important not to miss your dose. Call your doctor or health care professional if you are  unable to keep an appointment.  What may interact with this medicine?  Do not take this medicine with any of the following medications:  -other medicines containing denosumab  This medicine may also interact with the following medications:  -medicines that suppress the immune system  -medicines that treat cancer  -steroid medicines like prednisone or cortisone  This list may not describe all possible interactions. Give your health care provider a list of all the medicines, herbs, non-prescription drugs, or dietary supplements you use. Also tell them if you smoke, drink alcohol, or use illegal drugs. Some items may interact with your medicine.  What should I watch for while using this medicine?  Visit your doctor or health care professional for regular checks on your progress. Your doctor or health care professional may order blood tests and other tests to see how you are doing.  Call your doctor or health care professional if you get a cold or other infection while receiving this medicine. Do not treat yourself. This medicine may decrease your body's ability to fight infection.  You should make sure you get enough calcium and vitamin D while you are taking this medicine, unless your doctor tells you not to. Discuss the foods you eat and the vitamins you take with your health care professional.  See your dentist regularly. Brush and floss your teeth as directed. Before you have any dental work done, tell your dentist you are receiving this medicine.  Do   not become pregnant while taking this medicine or for 5 months after stopping it. Women should inform their doctor if they wish to become pregnant or think they might be pregnant. There is a potential for serious side effects to an unborn child. Talk to your health care professional or pharmacist for more information.  What side effects may I notice from receiving this medicine?  Side effects that you should report to your doctor or health care professional as soon as  possible:  -allergic reactions like skin rash, itching or hives, swelling of the face, lips, or tongue  -breathing problems  -chest pain  -fast, irregular heartbeat  -feeling faint or lightheaded, falls  -fever, chills, or any other sign of infection  -muscle spasms, tightening, or twitches  -numbness or tingling  -skin blisters or bumps, or is dry, peels, or red  -slow healing or unexplained pain in the mouth or jaw  -unusual bleeding or bruising  Side effects that usually do not require medical attention (Report these to your doctor or health care professional if they continue or are bothersome.):  -muscle pain  -stomach upset, gas  This list may not describe all possible side effects. Call your doctor for medical advice about side effects. You may report side effects to FDA at 1-800-FDA-1088.  Where should I keep my medicine?  This medicine is only given in a clinic, doctor's office, or other health care setting and will not be stored at home.  NOTE: This sheet is a summary. It may not cover all possible information. If you have questions about this medicine, talk to your doctor, pharmacist, or health care provider.      2016, [employer]vier/Gold Standard. (2011-09-10 12:37:47)

## 2017-08-08 NOTE — Telephone Encounter (Signed)
Appointments scheduled AVS/Calendar printed per 5/16 los °

## 2017-08-10 LAB — URINE CULTURE

## 2017-08-12 ENCOUNTER — Other Ambulatory Visit: Payer: Self-pay

## 2017-08-12 ENCOUNTER — Encounter (HOSPITAL_COMMUNITY)
Admission: RE | Admit: 2017-08-12 | Discharge: 2017-08-12 | Disposition: A | Discharge status: Home / Self Care | Payer: Medicare Other | Source: Ambulatory Visit | Attending: Hematology | Admitting: Hematology

## 2017-08-12 ENCOUNTER — Encounter (HOSPITAL_COMMUNITY): Payer: Self-pay | Admitting: Emergency Medicine

## 2017-08-12 ENCOUNTER — Ambulatory Visit (HOSPITAL_COMMUNITY)
Admission: RE | Admit: 2017-08-12 | Discharge: 2017-08-12 | Disposition: A | Discharge status: Home / Self Care | Payer: Medicare Other | Source: Ambulatory Visit | Attending: Surgery | Admitting: Surgery

## 2017-08-12 ENCOUNTER — Encounter (HOSPITAL_COMMUNITY): Payer: Self-pay

## 2017-08-12 ENCOUNTER — Ambulatory Visit (HOSPITAL_COMMUNITY)
Admission: RE | Admit: 2017-08-12 | Discharge: 2017-08-12 | Disposition: A | Discharge status: Home / Self Care | Payer: Medicare Other | Source: Ambulatory Visit | Attending: Hematology | Admitting: Hematology

## 2017-08-12 ENCOUNTER — Emergency Department (HOSPITAL_COMMUNITY)
Admission: EM | Admit: 2017-08-12 | Discharge: 2017-08-13 | Disposition: A | Discharge status: Home / Self Care | Payer: Medicare Other | Attending: Emergency Medicine | Admitting: Emergency Medicine

## 2017-08-12 DIAGNOSIS — Z87891 Personal history of nicotine dependence: Secondary | ICD-10-CM | POA: Insufficient documentation

## 2017-08-12 DIAGNOSIS — I10 Essential (primary) hypertension: Secondary | ICD-10-CM | POA: Insufficient documentation

## 2017-08-12 DIAGNOSIS — Z79899 Other long term (current) drug therapy: Secondary | ICD-10-CM | POA: Insufficient documentation

## 2017-08-12 DIAGNOSIS — K389 Disease of appendix, unspecified: Secondary | ICD-10-CM | POA: Insufficient documentation

## 2017-08-12 DIAGNOSIS — J40 Bronchitis, not specified as acute or chronic: Secondary | ICD-10-CM

## 2017-08-12 DIAGNOSIS — Z170 Estrogen receptor positive status [ER+]: Secondary | ICD-10-CM | POA: Insufficient documentation

## 2017-08-12 DIAGNOSIS — Z91040 Latex allergy status: Secondary | ICD-10-CM | POA: Insufficient documentation

## 2017-08-12 DIAGNOSIS — C50812 Malignant neoplasm of overlapping sites of left female breast: Secondary | ICD-10-CM

## 2017-08-12 DIAGNOSIS — R05 Cough: Secondary | ICD-10-CM | POA: Diagnosis present

## 2017-08-12 DIAGNOSIS — R509 Fever, unspecified: Secondary | ICD-10-CM | POA: Diagnosis not present

## 2017-08-12 MED ORDER — TECHNETIUM TC 99M MEDRONATE IV KIT
22.0000 | PACK | Freq: Once | INTRAVENOUS | Status: AC
  Administered 2017-08-12: 22 via INTRAVENOUS

## 2017-08-12 MED ORDER — IOPAMIDOL (ISOVUE-300) INJECTION 61%
INTRAVENOUS | Status: AC
  Administered 2017-08-12: 100 mL
  Filled 2017-08-12: qty 100

## 2017-08-12 NOTE — ED Triage Notes (Signed)
Pt from home called EMS for productive starting earlier today at [facility] having abd CT scan and bone scan. Approx 3 hrs ago began having chills body ache cough and emesis of phlegm Sore throaty last several days. Pt was currently on chemo which is now on hold d/t 2 up coming procedures

## 2017-08-13 ENCOUNTER — Other Ambulatory Visit: Payer: Self-pay

## 2017-08-13 ENCOUNTER — Telehealth: Payer: Self-pay

## 2017-08-13 ENCOUNTER — Emergency Department (HOSPITAL_COMMUNITY): Payer: Medicare Other

## 2017-08-13 DIAGNOSIS — J40 Bronchitis, not specified as acute or chronic: Secondary | ICD-10-CM | POA: Diagnosis not present

## 2017-08-13 DIAGNOSIS — C50912 Malignant neoplasm of unspecified site of left female breast: Secondary | ICD-10-CM

## 2017-08-13 DIAGNOSIS — C7951 Secondary malignant neoplasm of bone: Principal | ICD-10-CM

## 2017-08-13 LAB — CBC WITH DIFFERENTIAL/PLATELET
BASOS ABS: 0 10*3/uL (ref 0.0–0.1)
Basophils Relative: 0 %
EOS PCT: 2 %
Eosinophils Absolute: 0.2 10*3/uL (ref 0.0–0.7)
HEMATOCRIT: 35.8 % — AB (ref 36.0–46.0)
Hemoglobin: 11.7 g/dL — ABNORMAL LOW (ref 12.0–15.0)
LYMPHS ABS: 1.3 10*3/uL (ref 0.7–4.0)
LYMPHS PCT: 18 %
MCH: 28.3 pg (ref 26.0–34.0)
MCHC: 32.7 g/dL (ref 30.0–36.0)
MCV: 86.5 fL (ref 78.0–100.0)
MONO ABS: 0.4 10*3/uL (ref 0.1–1.0)
MONOS PCT: 6 %
NEUTROS ABS: 5.1 10*3/uL (ref 1.7–7.7)
Neutrophils Relative %: 74 %
PLATELETS: 273 10*3/uL (ref 150–400)
RBC: 4.14 MIL/uL (ref 3.87–5.11)
RDW: 13.5 % (ref 11.5–15.5)
WBC: 6.9 10*3/uL (ref 4.0–10.5)

## 2017-08-13 LAB — COMPREHENSIVE METABOLIC PANEL
ALT: 13 U/L — ABNORMAL LOW (ref 14–54)
ANION GAP: 10 (ref 5–15)
AST: 17 U/L (ref 15–41)
Albumin: 3.7 g/dL (ref 3.5–5.0)
Alkaline Phosphatase: 101 U/L (ref 38–126)
BILIRUBIN TOTAL: 1 mg/dL (ref 0.3–1.2)
BUN: 9 mg/dL (ref 6–20)
CO2: 23 mmol/L (ref 22–32)
Calcium: 9.1 mg/dL (ref 8.9–10.3)
Chloride: 108 mmol/L (ref 101–111)
Creatinine, Ser: 0.71 mg/dL (ref 0.44–1.00)
GFR calc Af Amer: >60 mL/min (ref >60)
GFR calc non Af Amer: >60 mL/min (ref >60)
GLUCOSE: 115 mg/dL — AB (ref 65–99)
POTASSIUM: 3.8 mmol/L (ref 3.5–5.1)
Sodium: 141 mmol/L (ref 135–145)
TOTAL PROTEIN: 7.7 g/dL (ref 6.5–8.1)

## 2017-08-13 LAB — GROUP A STREP BY PCR: Group A Strep by PCR: NOT DETECTED

## 2017-08-13 MED ORDER — ALBUTEROL SULFATE (2.5 MG/3ML) 0.083% IN NEBU
5.0000 mg | INHALATION_SOLUTION | Freq: Once | RESPIRATORY_TRACT | Status: AC
  Administered 2017-08-13: 5 mg via RESPIRATORY_TRACT
  Filled 2017-08-13: qty 6

## 2017-08-13 MED ORDER — SODIUM CHLORIDE 0.9 % IV BOLUS
1000.0000 mL | Freq: Once | INTRAVENOUS | Status: AC
  Administered 2017-08-13: 1000 mL via INTRAVENOUS

## 2017-08-13 MED ORDER — ONDANSETRON HCL 4 MG/2ML IJ SOLN
4.0000 mg | Freq: Once | INTRAMUSCULAR | Status: AC
Start: 2017-08-13 — End: 2017-08-13
  Administered 2017-08-13: 4 mg via INTRAVENOUS
  Filled 2017-08-13: qty 2

## 2017-08-13 MED ORDER — PALBOCICLIB 75 MG PO CAPS: ORAL_CAPSULE | ORAL | 2 refills | Status: DC

## 2017-08-13 MED ORDER — AZITHROMYCIN 250 MG PO TABS: ORAL_TABLET | ORAL | 0 refills | Status: DC

## 2017-08-13 NOTE — Telephone Encounter (Signed)
Patient calls stating she has not started [place].  Unsure if she should take it as she was at ED all night and diagnosed with bronchitis.  Scheduled for surgery the end of the month.  Please advise.

## 2017-08-13 NOTE — Discharge Instructions (Addendum)
Use your inhaler for wheezing and shortness of breath. Take the antibiotic until gone. You can take mucinex DM OTC for cough.  Take claritin OTC for nasal stuffiness. Recheck if you get a high fever, struggle to breathe or get a lot worse.

## 2017-08-13 NOTE — ED Provider Notes (Signed)
[facility] DEPT Provider Note   CSN: [id number] Arrival date & time: 08/12/17  2311  Time seen 12:20 AM   History   Chief Complaint Chief Complaint  Patient presents with  . Nausea  . Cough  . Abdominal Pain    HPI Michaela Little is a 57 y.o. female.  HPI patient has a history of breast cancer, stage III.  She reports she takes exemestane, but her ibrance was last taken the first of April in preparation of her having surgery on May 30.  She is going to have a an expander removed by Dr. Harlow Mares, plastic surgeon and her appendix removed by Dr. Ninfa Linden, general surgery.  She had a CT scan done earlier today in preparation for that operation.  She reports she is been coughing for a few days got worse today.  She states she is coughing up yellow and beige mucus.  She can sometimes feel short of breath.  She states when she coughs her central chest hurts.  She has undocumented fever but states she felt hot and started having chills this afternoon.  She thinks she may have had some wheezing and she is used an inhaler in the past but she did not use them today.  She states she has been on Flovent and albuterol in the past.  She also has a sore throat for 2 days.  She has been having nausea and vomiting states she can vomit "8 times in 3 hours".  She denies diarrhea.  She states her daughter was ill when she saw her on the 17th with "sniffles".  PCP Clent Demark, PA-C] Oncology Dr Burr Medico  Past Medical History:  Diagnosis Date  . Anxiety   . Arthritis    hands, knees, hips  . Asthma   . Cancer ([place])   . Cervical stenosis (uterine cervix)   . Dyspnea on exertion   . GERD (gastroesophageal reflux disease)   . Headache(784.0)   . History of breast cancer    2010--  LEFT  s/p  mastectomy (in [place]) AND CHEMORADIATION--  NO RECURRENCE  . History of cervical dysplasia   . History of TB skin testing    AS TEEN--  [place] W/ MEDS  . Hyperlipidemia   . Hypertension    . Malignant neoplasm of overlapping sites of left breast in female, estrogen receptor positive ([place]) 03/05/2013  . OSA (obstructive sleep apnea) moderate osa per study  09/2010   CPAP  , NOT USING ON REGULAR BASIS  . Pelvic pain in female   . Positive H. pylori test    08-05-2013  . Refusal of blood transfusions as patient is Jehovah's Witness   . Seasonal allergies   . Uterine fibroid   . Wears glasses     Patient Active Problem List   Diagnosis Date Noted  . Goals of care, counseling/discussion 01/20/2017  . Multinodular thyroid 02/08/2016  . Lumbar back pain with radiculopathy affecting left lower extremity 02/08/2016  . Carcinoma of breast metastatic to bone ([place]) 02/08/2016  . Bone metastases ([place]) 11/13/2015  . Right thyroid nodule 06/30/2014  . Allergic rhinitis due to pollen 06/30/2014  . Essential hypertension 12/24/2013  . Left ventricular diastolic dysfunction with preserved systolic function 10/93/2355  . Lump in neck 08/11/2013  . Abdominal pain, chronic, bilateral lower quadrant 08/05/2013  . Abdominal pain, epigastric 08/05/2013  . Morbid (severe) obesity due to excess calories ([place]) 06/23/2013  . Allergic rhinitis 06/23/2013  . GERD (gastroesophageal reflux  disease) 03/25/2013  . Constipation 03/25/2013  . Other and unspecified hyperlipidemia 03/25/2013  . Dysuria 03/25/2013  . Routine general medical examination at a health care facility 03/25/2013  . Malignant neoplasm of overlapping sites of left breast in female, estrogen receptor positive ([place]) 03/05/2013  . Cough 10/28/2012  . Dyspnea on exertion 10/28/2012  . Chronic abdominal pain 09/02/2012  . Pelvic pain in female 02/06/2012  . Atypical chest pain 11/13/2011  . Hyperlipidemia 11/13/2011  . Dysplasia of cervix, low grade (CIN 1) 08/22/2011  . Diastolic dysfunction 31/54/0086  . Influenza 03/04/2011  . Hypertension 03/04/2011  . Obesity 03/04/2011  . OSA (obstructive sleep apnea) 02/27/2011  .  Asthma 02/08/2011    Past Surgical History:  Procedure Laterality Date  . CARDIOVASCULAR STRESS TEST  10-29-2012   low risk perfusion study/  no significant reversibity/ ef 66%/  normal wall motion  . CERVICAL CONIZATION W/BX  2012   in  [place] N/A 05/14/2012   Procedure: LAPAROSCOPIC CHOLECYSTECTOMY;  Surgeon: Ralene Ok, MD;  Location: [facility];  Service: General;  Laterality: N/A;  . COLONOSCOPY  2011   normal per patient - [place]  . DILATION AND CURETTAGE OF UTERUS  10/15/2011   Procedure: DILATATION AND CURETTAGE;  Surgeon: Melina Schools, MD;  Location: [facility];  Service: Gynecology;  Laterality: N/A;  Conization &  endocervical curettings  . EXAMINATION UNDER ANESTHESIA N/A 08/20/2013   Procedure: EXAM UNDER ANESTHESIA;  Surgeon: Margarette Asal, MD;  Location: [facility];  Service: Gynecology;  Laterality: N/A;  . [place]   right  . LAPAROSCOPIC ASSISTED VAGINAL HYSTERECTOMY N/A 10/26/2014   Procedure: HYSTERECTOMY ABDOMINAL ;  Surgeon: Molli Posey, MD;  Location: [facility];  Service: Gynecology;  Laterality: N/A;  . MASTECTOMY Left 11/2008  in [place]   . SALPINGOOPHORECTOMY Bilateral 10/26/2014   Procedure: BILATERAL SALPINGO OOPHORECTOMY;  Surgeon: Molli Posey, MD;  Location: [facility];  Service: Gynecology;  Laterality: Bilateral;  . TRANSTHORACIC ECHOCARDIOGRAM  10-29-2012   mild lvh/  ef 60-65%/  grade II diastolic dysfunction/  trivial mr  &  tr     OB History    Gravida  10   Para  5   Term  5   Preterm      AB  5   Living  5     SAB  3   TAB  2   Ectopic      Multiple      Live Births               Home Medications    Prior to Admission medications   Medication Sig Start Date End Date Taking? Authorizing Provider  albuterol (PROVENTIL HFA;VENTOLIN HFA) 108 (90 Base) MCG/ACT inhaler Inhale 2 puffs into the lungs every 6 (six) hours as needed for wheezing or shortness of breath.   Yes  [provider]  amLODipine (NORVASC) 10 MG tablet Take 1 tablet (10 mg total) by mouth daily. 12/17/16  Yes Clent Demark, PA-C  carvedilol (COREG) 6.25 MG tablet Take 1 tablet (6.25 mg total) by mouth 2 (two) times daily with a meal. Patient taking differently: Take 12.5 mg by mouth daily.  12/17/16  Yes Clent Demark, PA-C  exemestane (AROMASIN) 25 MG tablet Take 1 tablet (25 mg total) by mouth daily after breakfast. 07/08/17  Yes [name], MD  famotidine (PEPCID) 20 MG tablet Take 1 TABLET at  bedtime Patient taking differently: Take 20 mg by mouth at bedtime.  02/19/17  Yes [name], MD  fluticasone (FLOVENT HFA) 110 MCG/ACT inhaler Inhale 2 puffs into the lungs 2 (two) times daily. With spacer Patient taking differently: Inhale 2 puffs into the lungs 2 (two) times daily as needed (for respiratory issues.). With spacer 12/05/16  Yes Kozlow, Donnamarie Poag, MD  furosemide (LASIX) 20 MG tablet Take 1 tablet (20 mg total) by mouth daily. Patient taking differently: Take 20 mg by mouth daily as needed (for fluid retention.).  12/17/16  Yes Clent Demark, PA-C  loratadine (CLARITIN) 10 MG tablet Take 20 mg by mouth daily.    Yes [provider]  naproxen (NAPROSYN) 500 MG tablet Take 1 tablet (500 mg total) by mouth 2 (two) times daily with a meal. Patient taking differently: Take 500 mg by mouth 2 (two) times daily as needed (for pain.).  04/12/17  Yes Clent Demark, PA-C  nystatin (MYCOSTATIN) 100000 UNIT/ML suspension Take 5 mLs (500,000 Units total) by mouth 4 (four) times daily. 08/08/17  Yes [name], MD  palbociclib [facility]) 75 MG capsule TAKE 1 CAPSULE (75MG ) BY MOUTH DAILY. TAKE WHOLE WITH FOOD. 08/08/17  Yes [name], MD  pantoprazole (PROTONIX) 40 MG tablet Take 1 tablet (40 mg total) by mouth daily. Take 30-60 min before first meal of the day 01/01/17  Yes [name], MD  promethazine (PHENERGAN) 25 MG tablet Take 1 tablet (25 mg total) by mouth every  6 (six) hours as needed for nausea or vomiting. 07/31/17  Yes [name], MD  ranitidine (ZANTAC) 300 MG tablet Take 1 tablet (300 mg total) by mouth at bedtime. Patient taking differently: Take 300 mg by mouth at bedtime as needed (for indigestion.).  12/05/16  Yes Kozlow, Donnamarie Poag, MD  azithromycin (ZITHROMAX Z-PAK) 250 MG tablet Take 2 po the first day then once a day for the next 4 days. 08/13/17   Rolland Porter, MD  tobramycin (TOBREX) 0.3 % ophthalmic solution Place 2 drops into both eyes every 4 (four) hours. Patient not taking: Reported on 08/09/2017 06/13/17   Nat Christen, MD    Family History Family History  Problem Relation Age of Onset  . Breast cancer Mother   . Colon cancer Mother   . Hypotension Mother   . Asthma Mother   . Diabetes type II Mother   . Arthritis Mother   . Clotting disorder Mother   . Cancer Mother        breast/colon  . Mental illness Brother   . Heart disease Brother   . Cerebral palsy Daughter   . Emphysema Brother        never smoker  . Colon cancer Maternal Aunt   . Cancer Maternal Aunt        colon  . Colon cancer Maternal Uncle   . Cancer Maternal Uncle        colon  . Esophageal cancer Neg Hx   . Rectal cancer Neg Hx   . Stomach cancer Neg Hx   . Thyroid disease Neg Hx     Social History Social History   Tobacco Use  . Smoking status: Former Smoker    Packs/day: 0.30    Years: 10.00    Pack years: 3.00    Types: Cigarettes    Last attempt to quit: 03/26/1988    Years since quitting: 29.4  . Smokeless tobacco: Never Used  Substance Use Topics  . Alcohol  use: No  . Drug use: No     Allergies   Lisinopril-hydrochlorothiazide; Adhesive [tape]; Fentanyl; Hctz [hydrochlorothiazide]; Latex; Lisinopril; Losartan potassium; Other; and Oxycodone   Review of Systems Review of Systems  All other systems reviewed and are negative.    Physical Exam Updated Vital Signs BP (!) 141/88 (BP Location: Right Arm) Comment (BP Location): left arm  restriction  Pulse 86   Temp 99.3 F (37.4 C) (Oral)   Resp 18   LMP 02/25/2009   SpO2 97%   Vital signs normal    Physical Exam  Constitutional: She is oriented to person, place, and time. She appears well-developed and well-nourished.  Non-toxic appearance. She does not appear ill. No distress.  HENT:  Head: Normocephalic and atraumatic.  Right Ear: External ear normal.  Left Ear: External ear normal.  Nose: Nose normal. No mucosal edema or rhinorrhea.  Mouth/Throat: Oropharynx is clear and moist and mucous membranes are normal. No dental abscesses or uvula swelling.  Eyes: Pupils are equal, round, and reactive to light. Conjunctivae and EOM are normal.  Neck: Normal range of motion and full passive range of motion without pain. Neck supple.  Cardiovascular: Normal rate, regular rhythm and normal heart sounds. Exam reveals no gallop and no friction rub.  No murmur heard. Pulmonary/Chest: Effort normal. No respiratory distress. She has decreased breath sounds. She has no wheezes. She has no rhonchi. She has no rales. She exhibits no tenderness and no crepitus.  Abdominal: Soft. Normal appearance and bowel sounds are normal. She exhibits no distension. There is generalized tenderness. There is no rebound and no guarding.  Musculoskeletal: Normal range of motion. She exhibits no edema or tenderness.  Moves all extremities well.   Neurological: She is alert and oriented to person, place, and time. She has normal strength. No cranial nerve deficit.  Skin: Skin is warm, dry and intact. No rash noted. No erythema. No pallor.  Psychiatric: She has a normal mood and affect. Her speech is normal and behavior is normal. Her mood appears not anxious.  Nursing note and vitals reviewed.    ED Treatments / Results  Labs (all labs ordered are listed, but only abnormal results are displayed)  Results for orders placed or performed during the hospital encounter of 08/12/17  Group A Strep by  PCR  Result Value Ref Range   Group A Strep by PCR NOT DETECTED NOT DETECTED  Comprehensive metabolic panel  Result Value Ref Range   Sodium 141 135 - 145 mmol/L   Potassium 3.8 3.5 - 5.1 mmol/L   Chloride 108 101 - 111 mmol/L   CO2 23 22 - 32 mmol/L   Glucose, Bld 115 (H) 65 - 99 mg/dL   BUN 9 6 - 20 mg/dL   Creatinine, Ser 0.71 0.44 - 1.00 mg/dL   Calcium 9.1 8.9 - 10.3 mg/dL   Total Protein 7.7 6.5 - 8.1 g/dL   Albumin 3.7 3.5 - 5.0 g/dL   AST 17 15 - 41 U/L   ALT 13 (L) 14 - 54 U/L   Alkaline Phosphatase 101 38 - 126 U/L   Total Bilirubin 1.0 0.3 - 1.2 mg/dL   GFR calc non Af Amer >60 >60 mL/min   GFR calc Af Amer >60 >60 mL/min   Anion gap 10 5 - 15  CBC with Differential  Result Value Ref Range   WBC 6.9 4.0 - 10.5 K/uL   RBC 4.14 3.87 - 5.11 MIL/uL   Hemoglobin 11.7 (L) 12.0 -  15.0 g/dL   HCT 35.8 (L) 36.0 - 46.0 %   MCV 86.5 78.0 - 100.0 fL   MCH 28.3 26.0 - 34.0 pg   MCHC 32.7 30.0 - 36.0 g/dL   RDW 13.5 11.5 - 15.5 %   Platelets 273 150 - 400 K/uL   Neutrophils Relative % 74 %   Neutro Abs 5.1 1.7 - 7.7 K/uL   Lymphocytes Relative 18 %   Lymphs Abs 1.3 0.7 - 4.0 K/uL   Monocytes Relative 6 %   Monocytes Absolute 0.4 0.1 - 1.0 K/uL   Eosinophils Relative 2 %   Eosinophils Absolute 0.2 0.0 - 0.7 K/uL   Basophils Relative 0 %   Basophils Absolute 0.0 0.0 - 0.1 K/uL     EKG None  Radiology  Dg Chest 2 View  Result Date: 08/13/2017 CLINICAL DATA:  Cough and fever. EXAM: CHEST - 2 VIEW COMPARISON:  Chest radiograph and CT 06/13/2017 FINDINGS: Heart size upper normal with unchanged mediastinal contours. No pulmonary edema. No focal airspace disease, pleural effusion or pneumothorax. Postsurgical change in the left chest wall/breast. IMPRESSION: No acute pulmonary process. Electronically Signed   By: Jeb Levering M.D.   On: 08/13/2017 01:19   NNm Bone Scan Whole Body  Result Date: 08/12/2017 CLINICAL DATA:  Stage IV invasive LEFT breast cancer, post  endocrine therapy, BILATERAL knee pain, RIGHT hip pain for years, LEFT forearm pain for months EXAM: NUCLEAR MEDICINE WHOLE BODY BONE SCAN TECHNIQUE: Whole body anterior and posterior images were obtained approximately 3 hours after intravenous injection of radiopharmaceutical. RADIOPHARMACEUTICALS:  22 mCi Technetium-42m MDP IV COMPARISON:  02/07/2017 Correlation: CT abdomen and pelvis 08/15/2017, CT chest 06/13/2017 FINDINGS: Uptake at the shoulders, knees, and RIGHT foot/ankle, typically degenerative. Uptake at the costovertebral junction of the RIGHT approximately eighth rib, likely degenerative, unchanged. Question mildly increased tracer localization within the lumbar spine at approximately L2 vertebral body; no abnormalities at this site on CT. No additional sites of worrisome abnormal osseous tracer accumulation are identified. Photopenic defect at inferior LEFT chest anteriorly corresponding to breast prosthesis. Otherwise expected urinary tract and soft tissue distribution of tracer. IMPRESSION: Scattered degenerative type uptake as above with a questionable focus of increased tracer localization at L2 vertebral body versus artifact; no abnormality is seen at this site by CT. Consider either characterization by MR or attention on follow-up imaging. Electronically Signed   By: Lavonia Dana M.D.   On: 08/12/2017 15:17   Ct Abdomen Pelvis W Contrast  Result Date: 08/12/2017 CLINICAL DATA:  Lower abdominal pain for 2 months. History of appendiceal thickening. History of left breast cancer. EXAM: CT ABDOMEN AND PELVIS WITH CONTRAST TECHNIQUE: Multidetector CT imaging of the abdomen and pelvis was performed using the standard protocol following bolus administration of intravenous contrast. CONTRAST:  155mL ISOVUE-300 IOPAMIDOL (ISOVUE-300) INJECTION 61% COMPARISON:  CT scan of February 07, 2017. FINDINGS: Lower chest: No acute abnormality. Hepatobiliary: No focal liver abnormality is seen. Status post  cholecystectomy. No biliary dilatation. Pancreas: Unremarkable. No pancreatic ductal dilatation or surrounding inflammatory changes. Spleen: Normal in size without focal abnormality. Adrenals/Urinary Tract: Adrenal glands appear normal. Stable right renal cyst is noted. No hydronephrosis or renal obstruction is noted. No renal or ureteral calculi are noted. Urinary bladder is unremarkable. Stomach/Bowel: The stomach appears normal. There is no evidence of bowel obstruction or inflammation. There is continued thickening of the appendix without surrounding inflammation. It demonstrates a maximum thickness of 10 mm which is decreased compared to prior exam. Vascular/Lymphatic:  No significant vascular findings are present. No enlarged abdominal or pelvic lymph nodes. Reproductive: Status post hysterectomy. No adnexal masses. Other: No abdominal wall hernia or abnormality. No abdominopelvic ascites. Musculoskeletal: Stable mixed lytic and sclerotic appearance of visualized skeleton is noted consistent with history of known osseous metastases. No acute osseous abnormality is noted. IMPRESSION: Continued thickening of the appendix is noted without surrounding inflammation. It has maximum measured diameter is decreased compared to prior exam. There is no evidence of acute inflammation. Stable mixed lytic and sclerotic appearance of visualized skeleton is noted consistent with history of known osseous metastases. No acute abnormality seen in the abdomen or pelvis. Electronically Signed   By: Marijo Conception, M.D.   On: 08/12/2017 11:56    Procedures Procedures (including critical care time)  Medications Ordered in ED Medications  albuterol (PROVENTIL) (2.5 MG/3ML) 0.083% nebulizer solution 5 mg (5 mg Nebulization Given 08/13/17 0055)  sodium chloride 0.9 % bolus 1,000 mL (0 mLs Intravenous Stopped 08/13/17 0437)  sodium chloride 0.9 % bolus 1,000 mL (0 mLs Intravenous Stopped 08/13/17 0301)  ondansetron (ZOFRAN)  injection 4 mg (4 mg Intravenous Given 08/13/17 0126)     Initial Impression / Assessment and Plan / ED Course  I have reviewed the triage vital signs and the nursing notes.  Pertinent labs & imaging results that were available during my care of the patient were reviewed by me and considered in my medical decision making (see chart for details).   Patient was given IV fluids and albuterol treatment.   05:58 AM PT discussed with Dr Irene Limbo, oncology, recommends sending viral respiratory panel and treat for bronchitis.  It was rechecked at 6 AM she states am not feeling any better am still congested.  We discussed her test results and that she will be going home.  Final Clinical Impressions(s) / ED Diagnoses   Final diagnoses:  Bronchitis    ED Discharge Orders        Ordered    azithromycin (ZITHROMAX Z-PAK) 250 MG tablet     08/13/17 6629      Plan discharge  Rolland Porter, MD, Barbette Or, MD 08/13/17 [phone]

## 2017-08-13 NOTE — Telephone Encounter (Signed)
Called patient per Dr. Burr Medico, explained UA did not show UTI, patient denies dysuria at this time so will hold off on calling in Cipro.  Patient verbalized an understanding.

## 2017-08-13 NOTE — Telephone Encounter (Signed)
-----   Message from [name], MD sent at 08/13/2017 12:01 PM EDT ----- Please let pt know that her urine test was not inconclusive for UTI. If she still has dysuria and or low pelvic discomfort, please call in cipro 500mg  bid for 5 days, thanks.  [name]  08/13/2017

## 2017-08-13 NOTE — Telephone Encounter (Signed)
Consulted with Gloriajean Dell NP, informed patient to not take the Ibrance and finish her treatment for bronchitis, patient verbalized an understanding.

## 2017-08-13 NOTE — Telephone Encounter (Signed)
Patient called stating she could not get the Ibrance from [facility] because they don't use her insurance anymore.  Requested refill be sent to [facility], was sent.

## 2017-08-14 ENCOUNTER — Ambulatory Visit (HOSPITAL_COMMUNITY): Payer: Self-pay | Admitting: Plastic Surgery

## 2017-08-14 NOTE — Pre-Procedure Instructions (Signed)
Michaela Little  08/14/2017      [place], [place] - [address] [place] [address] Phone: [phone] Fax: [place], [place], [place] [address] Phone: [phone] Fax: [phone]    Your procedure is scheduled on Thurs., Aug 22, 2017 from 7:30AM-9:30AM  Report to [facility] Admitting Entrance "A" at 5:30AM  Call this number if you have problems the morning of surgery:  [phone]   Remember:  No food after midnight on May 29th  You may drink Clear Liquids until 3 hours (4:30AM) prior to surgery .  Clear liquids allowed are: Water, Juice (non-citric and without pulp), Carbonated beverages, Clear Tea, Black Coffee only, Plain Jell-O only, Gatorade and Plain Popsicles only   Please complete your PRE-SURGERY ENSURE that was given to you by 4:30AM the morning of surgery.  Please, if able, drink it in one setting. DO NOT SIP.    Take these medicines the morning of surgery with A SIP OF WATER: AmLODipine (NORVASC), Carvedilol (COREG), Loratadine (CLARITIN), and Pantoprazole ([place]). If needed Promethazine (PHENERGAN) for nausea and Albuterol Inhaler for cough or wheezing (Bring with you the day of surgery)  As of today, stop taking all Other Aspirin Products, Vitamins, Fish oils, and Herbal medications. Also stop all NSAIDS i.e. Advil, Ibuprofen, Motrin, Aleve, Anaprox, Naproxen, BC and Goody Powders.    Do not wear jewelry, make-up or nail polish (fingers).  Do not wear lotions, powders, or perfumes, or deodorant.  Do not shave 48 hours prior to surgery.    Do not bring valuables to the hospital.  [facility] is not responsible for any belongings or valuables.  Contacts, dentures or bridgework may not be worn into surgery.  Leave your suitcase in the car.  After surgery it may be brought to your  room.  For patients admitted to the hospital, discharge time will be determined by your treatment team.  Patients discharged the day of surgery will not be allowed to drive home.   Special instructions:   [place]- Preparing For Surgery  Before surgery, you can play an important role. Because skin is not sterile, your skin needs to be as free of germs as possible. You can reduce the number of germs on your skin by washing with CHG (chlorahexidine gluconate) Soap before surgery.  CHG is an antiseptic cleaner which kills germs and bonds with the skin to continue killing germs even after washing.    Oral Hygiene is also important to reduce your risk of infection.  Remember - BRUSH YOUR TEETH THE MORNING OF SURGERY WITH YOUR REGULAR TOOTHPASTE  Please do not use if you have an allergy to CHG or antibacterial soaps. If your skin becomes reddened/irritated stop using the CHG.  Do not shave (including legs and underarms) for at least 48 hours prior to first CHG shower. It is OK to shave your face.  Please follow these instructions carefully.   1. Shower the NIGHT BEFORE SURGERY and the MORNING OF SURGERY with CHG.   2. If you chose to wash your hair, wash your hair first as usual with your normal shampoo.  3. After you shampoo, rinse your hair and body thoroughly to remove the shampoo.  4. Use CHG as you would any other liquid soap. You can apply CHG directly to the skin and wash gently with  a scrungie or a clean washcloth.   5. Apply the CHG Soap to your body ONLY FROM THE NECK DOWN.  Do not use on open wounds or open sores. Avoid contact with your eyes, ears, mouth and genitals (private parts). Wash Face and genitals (private parts)  with your normal soap.  6. Wash thoroughly, paying special attention to the area where your surgery will be performed.  7. Thoroughly rinse your body with warm water from the neck down.  8. DO NOT shower/wash with your normal soap after using and rinsing  off the CHG Soap.  9. Pat yourself dry with a CLEAN TOWEL.  10. Wear CLEAN PAJAMAS to bed the night before surgery, wear comfortable clothes the morning of surgery  11. Place CLEAN SHEETS on your bed the night of your first shower and DO NOT SLEEP WITH PETS.  Day of Surgery:  Do not apply any deodorants/lotions.  Please wear clean clothes to the hospital/surgery center.   Remember to brush your teeth WITH YOUR REGULAR TOOTHPASTE.  Please read over the following fact sheets that you were given. Pain Booklet, Coughing and Deep Breathing and Surgical Site Infection Prevention

## 2017-08-15 ENCOUNTER — Encounter (HOSPITAL_COMMUNITY): Payer: Self-pay

## 2017-08-15 ENCOUNTER — Encounter (HOSPITAL_COMMUNITY)
Admission: RE | Admit: 2017-08-15 | Discharge: 2017-08-15 | Disposition: A | Discharge status: Home / Self Care | Payer: Medicare Other | Source: Ambulatory Visit | Attending: Surgery | Admitting: Surgery

## 2017-08-15 ENCOUNTER — Other Ambulatory Visit: Payer: Self-pay

## 2017-08-15 DIAGNOSIS — Z01812 Encounter for preprocedural laboratory examination: Secondary | ICD-10-CM | POA: Insufficient documentation

## 2017-08-15 HISTORY — DX: Bronchitis, not specified as acute or chronic: J40

## 2017-08-15 HISTORY — DX: Disease of appendix, unspecified: K38.9

## 2017-08-15 LAB — NO BLOOD PRODUCTS

## 2017-08-15 NOTE — Progress Notes (Signed)
PCP - Dr. Altamease Oiler  Cardiologist - Denies  Chest x-ray -  08/13/17 (E)  EKG - 06/13/17 (E)  Stress Test - 11/08/12 (E)  ECHO - 01/22/17 (E)  Cardiac Cath - Denies  Sleep Study - Yes- Positive CPAP - None  LABS- 08/13/17: CBC w/D, CMP  ASA-Denies   Anesthesia- Yes- recent ER visit 08/13/17  Pt denies having chest pain, sob, or fever at this time. All instructions explained to the pt, with a verbal understanding of the material. Pt agrees to go over the instructions while at home for a better understanding. The opportunity to ask questions was provided.

## 2017-08-16 NOTE — Progress Notes (Signed)
Anesthesia Chart Review:   Case:  [id number] Date/Time:  08/22/17 0715   Procedures:      APPENDECTOMY LAPAROSCOPIC ERAS PATHWAY (N/A )     REMOVAL OF LEFT TISSUE EXPANDER (Left )   Anesthesia type:  General   Pre-op diagnosis:  enlarged appendix   Location:  [facility] OR ROOM 09 / [facility]   Surgeon:  Coralie Keens, MD; Crissie Reese, MD      DISCUSSION:  - Pt is a 57 year old female with hx OSA (not using CPAP), breast cancer, asthma  - Pt refuses all blood products  - ED visit 08/13/17 for viral bronchitis.  - No acute signs/sx illness documented at pre-admission testing 08/15/17   VS: BP (!) 107/59   Pulse 72   Temp 36.7 C   Resp 20   Ht 5\' 7"  (1.702 m)   Wt 281 lb 4.8 oz (127.6 kg)   LMP 02/25/2009   SpO2 97%   BMI 44.06 kg/m    PROVIDERS: PCP is Clent Demark, PA-C   Patient Care Team: [name], MD as Consulting Physician (Pulmonary Disease) [name], MD as Consulting Physician (Hematology)  - Note last pulmonary office visit 10/18/16 with Dr. Melvyn Novas; f/u prn recommended.    LABS: - CBC and CMP from 08/13/17 ED visit reviewed and are acceptable for surgery  IMAGES:  CXR 08/13/17: No acute pulmonary process  CT angio chest 06/13/17:  1.  No evidence of pulmonary embolism. 2.  No acute intrathoracic process. 3. There are a few new small pulmonary nodules in both lungs measuring up to 5 mm. Noncontrast chest CT in 6-12 months is recommended to evaluate for interval change. 4. Diffusely heterogeneous bone marrow density without focal lesion, similar to prior study. Bone scan in November 2018 was negative for osseous metastatic disease.   EKG 06/13/17: sinus rhythm   CV:  Echo 01/22/17:  - Left ventricle: The cavity size was normal. There was moderate concentric hypertrophy. Systolic function was normal. The estimated ejection fraction was in the range of 55% to 60%. Wall motion was normal; there were no regional wall motion abnormalities. Doppler  parameters are consistent with abnormal left ventricular relaxation (grade 1 diastolic dysfunction).  Nuclear stress test 10/31/12:  - Low risk stress nuclear study.   There is a small apical defect seen on stress and rest images which may represent accentuated apical attenuation. Old small apical scar cannot be excluded. There is no significant reversibility. There is good LV function without segmental wall motion abnormalities. - LV Ejection Fraction: 66%.  LV Wall Motion:  NL LV Function; NL Wall Motion   Past Medical History:  Diagnosis Date  . Anxiety   . Arthritis    hands, knees, hips  . Asthma   . Bronchitis   . Cancer ([place])   . Cervical stenosis (uterine cervix)   . Disorder of appendix    Enlarged  . Dyspnea on exertion   . GERD (gastroesophageal reflux disease)   . Headache(784.0)   . History of breast cancer    2010--  LEFT  s/p  mastectomy (in [place]) AND CHEMORADIATION--  NO RECURRENCE  . History of cervical dysplasia   . History of TB skin testing    [place]--  [place]/ [place]  . Hyperlipidemia   . Hypertension   . Malignant neoplasm of overlapping sites of left breast in female, estrogen receptor positive ([place]) 03/05/2013  . OSA (obstructive sleep apnea) moderate osa per study  09/2010   CPAP  , NOT USING ON REGULAR BASIS  . Pelvic pain in female   . Positive H. pylori test    08-05-2013  . Refusal of blood transfusions as patient is Jehovah's Witness   . Seasonal allergies   . Uterine fibroid   . Wears glasses     Past Surgical History:  Procedure Laterality Date  . CARDIOVASCULAR STRESS TEST  10-29-2012   low risk perfusion study/  no significant reversibity/ ef 66%/  normal wall motion  . CERVICAL CONIZATION W/BX  2012   in  [place] N/A 05/14/2012   Procedure: LAPAROSCOPIC CHOLECYSTECTOMY;  Surgeon: Ralene Ok, MD;  Location: [facility];  Service: General;  Laterality: N/A;  . COLONOSCOPY  2011   normal per patient - [place]  . DILATION AND CURETTAGE  OF UTERUS  10/15/2011   Procedure: DILATATION AND CURETTAGE;  Surgeon: Melina Schools, MD;  Location: [facility];  Service: Gynecology;  Laterality: N/A;  Conization &  endocervical curettings  . EXAMINATION UNDER ANESTHESIA N/A 08/20/2013   Procedure: EXAM UNDER ANESTHESIA;  Surgeon: Margarette Asal, MD;  Location: [facility];  Service: Gynecology;  Laterality: N/A;  . [place]   right  . LAPAROSCOPIC ASSISTED VAGINAL HYSTERECTOMY N/A 10/26/2014   Procedure: HYSTERECTOMY ABDOMINAL ;  Surgeon: Molli Posey, MD;  Location: [facility];  Service: Gynecology;  Laterality: N/A;  . MASTECTOMY Left 11/2008  in [place]   . SALPINGOOPHORECTOMY Bilateral 10/26/2014   Procedure: BILATERAL SALPINGO OOPHORECTOMY;  Surgeon: Molli Posey, MD;  Location: [facility];  Service: Gynecology;  Laterality: Bilateral;  . TRANSTHORACIC ECHOCARDIOGRAM  10-29-2012   mild lvh/  ef 60-65%/  grade II diastolic dysfunction/  trivial mr  &  tr    MEDICATIONS: . albuterol (PROVENTIL HFA;VENTOLIN HFA) 108 (90 Base) MCG/ACT inhaler  . amLODipine (NORVASC) 10 MG tablet  . azithromycin (ZITHROMAX Z-PAK) 250 MG tablet  . carvedilol (COREG) 6.25 MG tablet  . exemestane (AROMASIN) 25 MG tablet  . famotidine (PEPCID) 20 MG tablet  . fluticasone (FLOVENT HFA) 110 MCG/ACT inhaler  . furosemide (LASIX) 20 MG tablet  . loratadine (CLARITIN) 10 MG tablet  . naproxen (NAPROSYN) 500 MG tablet  . nystatin (MYCOSTATIN) 100000 UNIT/ML suspension  . palbociclib (IBRANCE) 75 MG capsule  . pantoprazole (PROTONIX) 40 MG tablet  . promethazine (PHENERGAN) 25 MG tablet  . ranitidine (ZANTAC) 300 MG tablet  . tobramycin (TOBREX) 0.3 % ophthalmic solution   No current facility-administered medications for this encounter.     If no changes, I anticipate pt can proceed with surgery as scheduled.   Willeen Cass, FNP-BC [facility] Short Stay Surgical Center/Anesthesiology Phone: [phone] 08/16/2017 12:05  PM

## 2017-08-18 LAB — CULTURE, BLOOD (ROUTINE X 2)
Culture: NO GROWTH
Special Requests: ADEQUATE

## 2017-08-20 ENCOUNTER — Telehealth (INDEPENDENT_AMBULATORY_CARE_PROVIDER_SITE_OTHER): Payer: Self-pay | Admitting: Physician Assistant

## 2017-08-20 NOTE — Telephone Encounter (Signed)
Spoke with patient and she has gotten the needed advice from the surgeon and the anesthesiologist. Michaela Little, [name]

## 2017-08-20 NOTE — Telephone Encounter (Signed)
Pt LVM to request a nurse call back she was in the ED and was diagnosed Bronchitis and will be having surgery on thursday.she would like some advice on what to do prior to the surgery please follow up

## 2017-08-21 MED ORDER — DEXTROSE 5 % IV SOLN
3.0000 g | INTRAVENOUS | Status: AC
  Administered 2017-08-22: 3 g via INTRAVENOUS
  Filled 2017-08-21: qty 3

## 2017-08-21 MED ORDER — CEFAZOLIN SODIUM-DEXTROSE 2-4 GM/100ML-% IV SOLN: 2.0000 g | INTRAVENOUS | Status: DC

## 2017-08-21 NOTE — H&P (Signed)
Michaela Little  Location: [facility] Patient #: [phone] DOB: 1960/11/25 Single / Language: Michaela Little / Race: Black or African American Female   History of Present Illness   The patient is a 57 year old female who presents with abdominal pain. This patient is referred by Dr. [name] for evaluation of right sided abdominal pain. She is a complex patient with a history of metastatic breast cancer. Her original surgery was in 2010. She still has a left breast tissue expander in place. For the past year, she has been having right upper quadrant abdominal pain her into her flank and down into her groin. She had a CT scan earlier this year which showed a possible thickened appendix. There was no signs of appendicitis. She has had a follow-up CAT scan recently which shows the appendix is unchanged. Again there are no other intra-abdominal problems, inflammatory changes, enlarged lymph nodes, or hernias. No cause of her abdominal pain has been elicited.   Past Surgical History [name], CMA;  Mastectomy  Left.  Diagnostic Studies History [name], CMA;  Colonoscopy  1-5 years ago Mammogram  within last year  Allergies [name], CMA;  Lisinopril-Hydrochlorothiazide *ANTIHYPERTENSIVES*  Adhesive Tape  FentaNYL *ANALGESICS - OPIOID*  Latex  OxyCODONE HCl *ANALGESICS - OPIOID*  Losartan Potassium *ANTIHYPERTENSIVES*   Medication History ([name], CMA;  TraMADol HCl (50MG  Tablet, Oral) Active. AmLODIPine Besylate (10MG  Tablet, Oral) Active. Carvedilol (6.25MG  Tablet, Oral) Active. Exemestane (25MG  Tablet, Oral) Active. Famotidine (20MG  Tablet, Oral) Active. HydrOXYzine HCl (25MG  Tablet, Oral) Active. Ibrance (75MG  Capsule, Oral) Active. Pantoprazole Sodium (40MG  Tablet DR, Oral) Active. Promethazine HCl (25MG  Tablet, Oral) Active. RaNITidine HCl (300MG  Tablet, Oral) Active. Calcium (500MG  Tablet, Oral) Active. Vitamin D  (Cholecalciferol) (1000UNIT Capsule, Oral) Active. Black Currant Seed Oil (500MG  Capsule, Oral) Active. Medications Reconciled  Social History Alcohol use  Remotely quit alcohol use. Caffeine use  Tea.  Pregnancy / Birth History [name], CMA;  Age of men[age] Gravida  9 Para  5  Other Problems ([name], CMA;   Back Pain  Breast Cancer  Gastroesophageal Reflux Disease  Oophorectomy     Review of Systems  General Present- Fatigue. Not Present- Appetite Loss, Chills, Fever, Night Sweats, Weight Gain and Weight Loss. HEENT Present- Wears glasses/contact lenses. Not Present- Earache, Hearing Loss, Hoarseness, Nose Bleed, Oral Ulcers, Ringing in the Ears, Seasonal Allergies, Sinus Pain, Sore Throat, Visual Disturbances and Yellow Eyes. Cardiovascular Present- Shortness of Breath and Swelling of Extremities. Not Present- Chest Pain, Difficulty Breathing Lying Down, Leg Cramps, Palpitations and Rapid Heart Rate. Gastrointestinal Present- Bloating, Nausea and Vomiting. Not Present- Abdominal Pain, Bloody Stool, Change in Bowel Habits, Chronic diarrhea, Constipation, Difficulty Swallowing, Excessive gas, Gets full quickly at meals, Hemorrhoids, Indigestion and Rectal Pain.  Vitals   Weight: 283.5 lb Height: 67in Body Surface Area: 2.35 m Body Mass Index: 44.4 kg/m  Temp.: 98.35F(Oral)  Pulse: 86 (Regular)      Physical Exam General Mental Status-Alert. General Appearance-Consistent with stated age. Hydration-Well hydrated. Voice-Normal.  Head and Neck Head-normocephalic, atraumatic with no lesions or palpable masses.  Eye Eyeball - Bilateral-Extraocular movements intact. Sclera/Conjunctiva - Bilateral-No scleral icterus.  Chest and Lung Exam Chest and lung exam reveals -quiet, even and easy respiratory effort with no use of accessory muscles and on auscultation, normal breath sounds, no adventitious sounds and  normal vocal resonance. Inspection Chest Wall - Normal. Back - normal.  Cardiovascular Cardiovascular examination reveals -on palpation PMI is normal  in location and amplitude, no palpable S3 or S4. Normal cardiac borders., normal heart sounds, regular rate and rhythm with no murmurs, carotid auscultation reveals no bruits and normal pedal pulses bilaterally.  Abdomen Inspection Inspection of the abdomen reveals - No Hernias. Skin - Scar - no surgical scars. Palpation/Percussion Palpation and Percussion of the abdomen reveal - Soft, Non Tender, No Rebound tenderness, No Rigidity (guarding) and No hepatosplenomegaly. Auscultation Auscultation of the abdomen reveals - Bowel sounds normal. Note: Her tenderness is along the right flank at the costal margin and right upper quadrant. There is no real tenderness in the right lower quadrant. I feel no hernia defects or masses or adenopathy   Neurologic - Did not examine.  Musculoskeletal - Did not examine.    Assessment & Plan  ABDOMINAL PAIN, RIGHT LATERAL WITH DILATED APPENDIX  Impression: She has metastatic breast cancer with only bony involvement. A CAT scan of her abdomen shows no intra-abdominal process other than a thickened appendix which is not changed in 8 months. I discussed this with the patient more for family members in detail. I do not believe this is the cause of all her abdominal complaints although I could not completely tell them that there is not something pathological and healthy appendix based on the thickening in the scan. I could find no other cause of her abdominal complaints based on her examination and CT findings. We discussed proceeding with a laparoscopic appendectomy with a diagnostic laparoscopy versus a follow-up scan in several months. She is going to be referred to Dr. Harlow Mares who was seen in the past from a plastic surgery standpoint to consider removal of her tissue expander on the left chest at the same  time. Should she decide to forego this, I could schedule her appendectomy for next week. We will try to make these arrangements as soon as possible while she thinks about things.  ADDENDUM: Repeat CT of the abdomen on 08/12/2017 again shows a thickened appendix without signs of appendicitis.  She has seen Dr. Harlow Mares. Will proceed to the OR to remove her appendix and he will remove the tissue expander from the left chest We discussed the risks including but not limited to bleeding, infection, injury to surrounding structures, the need to convert to an open procedure, the need for further procedures if malignancy is found, cardiopulmonary issues, etc.  She agrees to proceed.

## 2017-08-22 ENCOUNTER — Encounter (HOSPITAL_COMMUNITY)
Admission: RE | Disposition: A | Discharge status: Home / Self Care | Payer: Self-pay | Source: Ambulatory Visit | Attending: Surgery

## 2017-08-22 ENCOUNTER — Ambulatory Visit (HOSPITAL_COMMUNITY): Payer: Medicare Other | Admitting: Emergency Medicine

## 2017-08-22 ENCOUNTER — Observation Stay (HOSPITAL_COMMUNITY)
Admission: RE | Admit: 2017-08-22 | Discharge: 2017-08-23 | Disposition: A | Discharge status: Home / Self Care | Payer: Medicare Other | Source: Ambulatory Visit | Attending: Surgery | Admitting: Surgery

## 2017-08-22 ENCOUNTER — Ambulatory Visit (HOSPITAL_COMMUNITY): Payer: Medicare Other | Admitting: Anesthesiology

## 2017-08-22 ENCOUNTER — Other Ambulatory Visit: Payer: Self-pay

## 2017-08-22 ENCOUNTER — Encounter (HOSPITAL_COMMUNITY): Payer: Self-pay | Admitting: Surgery

## 2017-08-22 DIAGNOSIS — C785 Secondary malignant neoplasm of large intestine and rectum: Secondary | ICD-10-CM | POA: Diagnosis not present

## 2017-08-22 DIAGNOSIS — G4730 Sleep apnea, unspecified: Secondary | ICD-10-CM | POA: Insufficient documentation

## 2017-08-22 DIAGNOSIS — Z87891 Personal history of nicotine dependence: Secondary | ICD-10-CM | POA: Insufficient documentation

## 2017-08-22 DIAGNOSIS — Z170 Estrogen receptor positive status [ER+]: Secondary | ICD-10-CM | POA: Diagnosis not present

## 2017-08-22 DIAGNOSIS — C7951 Secondary malignant neoplasm of bone: Secondary | ICD-10-CM | POA: Insufficient documentation

## 2017-08-22 DIAGNOSIS — Z888 Allergy status to other drugs, medicaments and biological substances status: Secondary | ICD-10-CM | POA: Insufficient documentation

## 2017-08-22 DIAGNOSIS — Z79899 Other long term (current) drug therapy: Secondary | ICD-10-CM | POA: Insufficient documentation

## 2017-08-22 DIAGNOSIS — Z885 Allergy status to narcotic agent status: Secondary | ICD-10-CM | POA: Diagnosis not present

## 2017-08-22 DIAGNOSIS — F419 Anxiety disorder, unspecified: Secondary | ICD-10-CM | POA: Diagnosis not present

## 2017-08-22 DIAGNOSIS — C50912 Malignant neoplasm of unspecified site of left female breast: Secondary | ICD-10-CM | POA: Diagnosis present

## 2017-08-22 DIAGNOSIS — Z6841 Body Mass Index (BMI) 40.0 and over, adult: Secondary | ICD-10-CM | POA: Insufficient documentation

## 2017-08-22 DIAGNOSIS — J45909 Unspecified asthma, uncomplicated: Secondary | ICD-10-CM | POA: Insufficient documentation

## 2017-08-22 DIAGNOSIS — Z9012 Acquired absence of left breast and nipple: Secondary | ICD-10-CM | POA: Diagnosis not present

## 2017-08-22 DIAGNOSIS — M1990 Unspecified osteoarthritis, unspecified site: Secondary | ICD-10-CM | POA: Insufficient documentation

## 2017-08-22 DIAGNOSIS — Z9049 Acquired absence of other specified parts of digestive tract: Secondary | ICD-10-CM

## 2017-08-22 DIAGNOSIS — Z91040 Latex allergy status: Secondary | ICD-10-CM | POA: Diagnosis not present

## 2017-08-22 DIAGNOSIS — K219 Gastro-esophageal reflux disease without esophagitis: Secondary | ICD-10-CM | POA: Diagnosis not present

## 2017-08-22 DIAGNOSIS — I10 Essential (primary) hypertension: Secondary | ICD-10-CM | POA: Diagnosis not present

## 2017-08-22 HISTORY — PX: TISSUE EXPANDER PLACEMENT: SHX2530

## 2017-08-22 HISTORY — PX: LAPAROSCOPIC APPENDECTOMY: SHX408

## 2017-08-22 HISTORY — DX: Nonspecific reaction to tuberculin skin test without active tuberculosis: R76.11

## 2017-08-22 HISTORY — PX: TISSUE EXPANDER REMOVAL: SHX2531

## 2017-08-22 SURGERY — APPENDECTOMY, LAPAROSCOPIC
Anesthesia: General | Site: Chest

## 2017-08-22 MED ORDER — GENTAMICIN SULFATE 40 MG/ML IJ SOLN
INTRAMUSCULAR | Status: AC
  Filled 2017-08-22: qty 2

## 2017-08-22 MED ORDER — ONDANSETRON 4 MG PO TBDP: 4.0000 mg | ORAL_TABLET | Freq: Four times a day (QID) | ORAL | Status: DC | PRN

## 2017-08-22 MED ORDER — SODIUM CHLORIDE 0.9 % IV SOLN
Freq: Once | INTRAVENOUS | Status: DC
  Filled 2017-08-22: qty 1

## 2017-08-22 MED ORDER — FENTANYL CITRATE (PF) 100 MCG/2ML IJ SOLN
INTRAMUSCULAR | Status: AC
  Filled 2017-08-22: qty 2

## 2017-08-22 MED ORDER — 0.9 % SODIUM CHLORIDE (POUR BTL) OPTIME
TOPICAL | Status: DC | PRN
  Administered 2017-08-22: 1000 mL

## 2017-08-22 MED ORDER — KETAMINE HCL 10 MG/ML IJ SOLN
INTRAMUSCULAR | Status: AC
  Filled 2017-08-22: qty 1

## 2017-08-22 MED ORDER — ONDANSETRON HCL 4 MG/2ML IJ SOLN
INTRAMUSCULAR | Status: AC
  Filled 2017-08-22: qty 2

## 2017-08-22 MED ORDER — ACETAMINOPHEN 500 MG PO TABS
ORAL_TABLET | ORAL | Status: AC
Start: 2017-08-22 — End: 2017-08-22
  Filled 2017-08-22: qty 2

## 2017-08-22 MED ORDER — CHLORHEXIDINE GLUCONATE CLOTH 2 % EX PADS: 6.0000 | MEDICATED_PAD | Freq: Once | CUTANEOUS | Status: DC

## 2017-08-22 MED ORDER — PROPOFOL 10 MG/ML IV BOLUS
INTRAVENOUS | Status: AC
  Filled 2017-08-22: qty 20

## 2017-08-22 MED ORDER — MIDAZOLAM HCL 5 MG/5ML IJ SOLN
INTRAMUSCULAR | Status: DC | PRN
  Administered 2017-08-22 (×2): 1 mg via INTRAVENOUS

## 2017-08-22 MED ORDER — TRAMADOL HCL 50 MG PO TABS
50.0000 mg | ORAL_TABLET | Freq: Four times a day (QID) | ORAL | Status: DC | PRN
  Administered 2017-08-22 – 2017-08-23 (×2): 50 mg via ORAL
  Filled 2017-08-22 (×2): qty 1

## 2017-08-22 MED ORDER — METHOCARBAMOL 500 MG PO TABS: 500.0000 mg | ORAL_TABLET | Freq: Four times a day (QID) | ORAL | Status: DC | PRN

## 2017-08-22 MED ORDER — FENTANYL CITRATE (PF) 100 MCG/2ML IJ SOLN
25.0000 ug | INTRAMUSCULAR | Status: DC | PRN
  Administered 2017-08-22: 25 ug via INTRAVENOUS
  Filled 2017-08-22 (×2): qty 2

## 2017-08-22 MED ORDER — ONDANSETRON HCL 4 MG/2ML IJ SOLN: 4.0000 mg | Freq: Four times a day (QID) | INTRAMUSCULAR | Status: DC | PRN

## 2017-08-22 MED ORDER — HEPARIN SODIUM (PORCINE) 5000 UNIT/ML IJ SOLN
INTRAMUSCULAR | Status: AC
Start: 2017-08-22 — End: 2017-08-22
  Administered 2017-08-22: 5000 [IU] via SUBCUTANEOUS
  Filled 2017-08-22: qty 1

## 2017-08-22 MED ORDER — FENTANYL CITRATE (PF) 100 MCG/2ML IJ SOLN
50.0000 ug | INTRAMUSCULAR | Status: DC | PRN
  Administered 2017-08-22: 50 ug via INTRAVENOUS

## 2017-08-22 MED ORDER — DIPHENHYDRAMINE HCL 25 MG PO CAPS: 25.0000 mg | ORAL_CAPSULE | Freq: Four times a day (QID) | ORAL | Status: DC | PRN

## 2017-08-22 MED ORDER — GABAPENTIN 300 MG PO CAPS: 300.0000 mg | ORAL_CAPSULE | ORAL | Status: DC

## 2017-08-22 MED ORDER — CELECOXIB 200 MG PO CAPS
ORAL_CAPSULE | ORAL | Status: AC
  Filled 2017-08-22: qty 1

## 2017-08-22 MED ORDER — MIDAZOLAM HCL 2 MG/2ML IJ SOLN
INTRAMUSCULAR | Status: AC
  Filled 2017-08-22: qty 2

## 2017-08-22 MED ORDER — BUPIVACAINE-EPINEPHRINE 0.5% -1:200000 IJ SOLN
INTRAMUSCULAR | Status: DC | PRN
  Administered 2017-08-22: 20 mL

## 2017-08-22 MED ORDER — CARVEDILOL 12.5 MG PO TABS
12.5000 mg | ORAL_TABLET | Freq: Every day | ORAL | Status: DC
  Administered 2017-08-23: 12.5 mg via ORAL
  Filled 2017-08-22: qty 1

## 2017-08-22 MED ORDER — CELECOXIB 200 MG PO CAPS
200.0000 mg | ORAL_CAPSULE | ORAL | Status: AC
  Administered 2017-08-22: 200 mg via ORAL

## 2017-08-22 MED ORDER — FENTANYL CITRATE (PF) 250 MCG/5ML IJ SOLN
INTRAMUSCULAR | Status: AC
  Filled 2017-08-22: qty 5

## 2017-08-22 MED ORDER — ROCURONIUM BROMIDE 100 MG/10ML IV SOLN
INTRAVENOUS | Status: DC | PRN
  Administered 2017-08-22: 10 mg via INTRAVENOUS
  Administered 2017-08-22: 50 mg via INTRAVENOUS

## 2017-08-22 MED ORDER — LACTATED RINGERS IV SOLN
INTRAVENOUS | Status: DC | PRN
  Administered 2017-08-22 (×2): via INTRAVENOUS

## 2017-08-22 MED ORDER — LIDOCAINE HCL (CARDIAC) PF 100 MG/5ML IV SOSY
PREFILLED_SYRINGE | INTRAVENOUS | Status: DC | PRN
  Administered 2017-08-22: 100 mg via INTRAVENOUS

## 2017-08-22 MED ORDER — EPHEDRINE SULFATE 50 MG/ML IJ SOLN
INTRAMUSCULAR | Status: DC | PRN
  Administered 2017-08-22: 5 mg via INTRAVENOUS
  Administered 2017-08-22: 10 mg via INTRAVENOUS
  Administered 2017-08-22 (×3): 5 mg via INTRAVENOUS

## 2017-08-22 MED ORDER — GABAPENTIN 300 MG PO CAPS
ORAL_CAPSULE | ORAL | Status: AC
  Filled 2017-08-22: qty 1

## 2017-08-22 MED ORDER — BUPIVACAINE-EPINEPHRINE (PF) 0.5% -1:200000 IJ SOLN
INTRAMUSCULAR | Status: AC
  Filled 2017-08-22: qty 30

## 2017-08-22 MED ORDER — ACETAMINOPHEN 500 MG PO TABS
1000.0000 mg | ORAL_TABLET | ORAL | Status: AC
  Administered 2017-08-22: 1000 mg via ORAL

## 2017-08-22 MED ORDER — SUGAMMADEX SODIUM 500 MG/5ML IV SOLN
INTRAVENOUS | Status: DC | PRN
  Administered 2017-08-22: 260 mg via INTRAVENOUS

## 2017-08-22 MED ORDER — SUCCINYLCHOLINE CHLORIDE 200 MG/10ML IV SOSY
PREFILLED_SYRINGE | INTRAVENOUS | Status: AC
  Filled 2017-08-22: qty 10

## 2017-08-22 MED ORDER — PHENYLEPHRINE HCL 10 MG/ML IJ SOLN
INTRAMUSCULAR | Status: DC | PRN
  Administered 2017-08-22 (×2): 80 ug via INTRAVENOUS
  Administered 2017-08-22: 40 ug via INTRAVENOUS
  Administered 2017-08-22: 80 ug via INTRAVENOUS
  Administered 2017-08-22 (×3): 40 ug via INTRAVENOUS

## 2017-08-22 MED ORDER — ONDANSETRON HCL 4 MG/2ML IJ SOLN
INTRAMUSCULAR | Status: DC | PRN
  Administered 2017-08-22 (×2): 4 mg via INTRAVENOUS

## 2017-08-22 MED ORDER — ENOXAPARIN SODIUM 40 MG/0.4ML ~~LOC~~ SOLN
40.0000 mg | SUBCUTANEOUS | Status: DC
  Administered 2017-08-23: 40 mg via SUBCUTANEOUS
  Filled 2017-08-22: qty 0.4

## 2017-08-22 MED ORDER — SODIUM CHLORIDE 0.9 % IV SOLN
INTRAVENOUS | Status: DC
  Administered 2017-08-22: 50 mL/h via INTRAVENOUS
  Administered 2017-08-22: 23:00:00 via INTRAVENOUS

## 2017-08-22 MED ORDER — SUCCINYLCHOLINE CHLORIDE 200 MG/10ML IV SOSY
PREFILLED_SYRINGE | INTRAVENOUS | Status: DC | PRN
  Administered 2017-08-22: 160 mg via INTRAVENOUS

## 2017-08-22 MED ORDER — AMLODIPINE BESYLATE 10 MG PO TABS
10.0000 mg | ORAL_TABLET | Freq: Every day | ORAL | Status: DC
  Administered 2017-08-23: 10 mg via ORAL
  Filled 2017-08-22: qty 1

## 2017-08-22 MED ORDER — DEXAMETHASONE SODIUM PHOSPHATE 10 MG/ML IJ SOLN
INTRAMUSCULAR | Status: DC | PRN
  Administered 2017-08-22: 10 mg via INTRAVENOUS

## 2017-08-22 MED ORDER — HYDROMORPHONE HCL 2 MG/ML IJ SOLN: 0.2500 mg | INTRAMUSCULAR | Status: DC | PRN

## 2017-08-22 MED ORDER — HEPARIN SODIUM (PORCINE) 5000 UNIT/ML IJ SOLN
5000.0000 [IU] | Freq: Once | INTRAMUSCULAR | Status: AC
  Administered 2017-08-22: 5000 [IU] via SUBCUTANEOUS

## 2017-08-22 MED ORDER — ALBUTEROL SULFATE (2.5 MG/3ML) 0.083% IN NEBU: 3.0000 mL | INHALATION_SOLUTION | Freq: Four times a day (QID) | RESPIRATORY_TRACT | Status: DC | PRN

## 2017-08-22 MED ORDER — FENTANYL CITRATE (PF) 100 MCG/2ML IJ SOLN
INTRAMUSCULAR | Status: DC | PRN
  Administered 2017-08-22: 50 ug via INTRAVENOUS
  Administered 2017-08-22: 150 ug via INTRAVENOUS

## 2017-08-22 MED ORDER — DIPHENHYDRAMINE HCL 50 MG/ML IJ SOLN: 25.0000 mg | Freq: Four times a day (QID) | INTRAMUSCULAR | Status: DC | PRN

## 2017-08-22 MED ORDER — EXEMESTANE 25 MG PO TABS
25.0000 mg | ORAL_TABLET | Freq: Every day | ORAL | Status: DC
  Administered 2017-08-23: 25 mg via ORAL
  Filled 2017-08-22: qty 1

## 2017-08-22 MED ORDER — PROPOFOL 10 MG/ML IV BOLUS
INTRAVENOUS | Status: DC | PRN
  Administered 2017-08-22: 200 mg via INTRAVENOUS
  Administered 2017-08-22: 50 mg via INTRAVENOUS

## 2017-08-22 MED ORDER — LIDOCAINE IN D5W 4-5 MG/ML-% IV SOLN
INTRAVENOUS | Status: DC | PRN
  Administered 2017-08-22: 25 ug/kg/min via INTRAVENOUS

## 2017-08-22 SURGICAL SUPPLY — 72 items
APPLIER CLIP 5 13 M/L LIGAMAX5 (MISCELLANEOUS)
APPLIER CLIP 9.375 MED OPEN (MISCELLANEOUS)
APPLIER CLIP ROT 10 11.4 M/L (STAPLE)
ATCH SMKEVC FLXB CAUT HNDSWH (FILTER) ×2 IMPLANT
BAG DECANTER FOR FLEXI CONT (MISCELLANEOUS) ×4 IMPLANT
BINDER BREAST XXLRG (GAUZE/BANDAGES/DRESSINGS) ×4 IMPLANT
BIOPATCH RED 1 DISK 7.0 (GAUZE/BANDAGES/DRESSINGS) ×6 IMPLANT
BIOPATCH RED 1IN DISK 7.0MM (GAUZE/BANDAGES/DRESSINGS) ×2
BLADE 10 SAFETY STRL DISP (BLADE) ×4 IMPLANT
CANISTER SUCT 3000ML PPV (MISCELLANEOUS) ×8 IMPLANT
CHLORAPREP W/TINT 26ML (MISCELLANEOUS) ×8 IMPLANT
CLIP APPLIE 5 13 M/L LIGAMAX5 (MISCELLANEOUS) IMPLANT
CLIP APPLIE 9.375 MED OPEN (MISCELLANEOUS) IMPLANT
CLIP APPLIE ROT 10 11.4 M/L (STAPLE) IMPLANT
COVER SURGICAL LIGHT HANDLE (MISCELLANEOUS) ×8 IMPLANT
CUTTER FLEX LINEAR 45M (STAPLE) IMPLANT
DERMABOND ADVANCED (GAUZE/BANDAGES/DRESSINGS) ×4
DERMABOND ADVANCED .7 DNX12 (GAUZE/BANDAGES/DRESSINGS) ×4 IMPLANT
DRAIN CHANNEL 19F RND (DRAIN) ×8 IMPLANT
DRAPE HALF SHEET 40X57 (DRAPES) ×12 IMPLANT
DRAPE ORTHO SPLIT 77X108 STRL (DRAPES) ×4
DRAPE SURG 17X23 STRL (DRAPES) ×8 IMPLANT
DRAPE SURG ORHT 6 SPLT 77X108 (DRAPES) ×4 IMPLANT
DRAPE WARM FLUID 44X44 (DRAPE) ×4 IMPLANT
DRSG PAD ABDOMINAL 8X10 ST (GAUZE/BANDAGES/DRESSINGS) ×8 IMPLANT
DRSG SORBAVIEW 3.5X5-5/16 MED (GAUZE/BANDAGES/DRESSINGS) ×8 IMPLANT
ELECT BLADE 6.5 EXT (BLADE) IMPLANT
ELECT CAUTERY BLADE 6.4 (BLADE) ×4 IMPLANT
ELECT REM PT RETURN 9FT ADLT (ELECTROSURGICAL) ×8
ELECTRODE REM PT RTRN 9FT ADLT (ELECTROSURGICAL) ×4 IMPLANT
EVACUATOR SILICONE 100CC (DRAIN) ×8 IMPLANT
EVACUATOR SMOKE ACCUVAC VALLEY (FILTER) ×2
GLOVE BIO SURGEON STRL SZ7.5 (GLOVE) ×4 IMPLANT
GLOVE BIOGEL PI IND STRL 8 (GLOVE) ×2 IMPLANT
GLOVE BIOGEL PI INDICATOR 8 (GLOVE) ×2
GLOVE SURG SIGNA 7.5 PF LTX (GLOVE) ×4 IMPLANT
GOWN STRL REUS W/ TWL LRG LVL3 (GOWN DISPOSABLE) ×6 IMPLANT
GOWN STRL REUS W/ TWL XL LVL3 (GOWN DISPOSABLE) ×4 IMPLANT
GOWN STRL REUS W/TWL LRG LVL3 (GOWN DISPOSABLE) ×6
GOWN STRL REUS W/TWL XL LVL3 (GOWN DISPOSABLE) ×4
KIT BASIN OR (CUSTOM PROCEDURE TRAY) ×8 IMPLANT
KIT DISPOSABLE SPY ELITE (KITS) ×4 IMPLANT
KIT TURNOVER KIT B (KITS) ×8 IMPLANT
MARKER SKIN DUAL TIP RULER LAB (MISCELLANEOUS) ×4 IMPLANT
NS IRRIG 1000ML POUR BTL (IV SOLUTION) ×12 IMPLANT
PACK GENERAL/GYN (CUSTOM PROCEDURE TRAY) ×4 IMPLANT
PAD ABD 8X10 STRL (GAUZE/BANDAGES/DRESSINGS) ×4 IMPLANT
PAD ARMBOARD 7.5X6 YLW CONV (MISCELLANEOUS) ×12 IMPLANT
POUCH SPECIMEN RETRIEVAL 10MM (ENDOMECHANICALS) ×4 IMPLANT
PREFILTER EVAC NS 1 1/3-3/8IN (MISCELLANEOUS) ×4 IMPLANT
RELOAD 45 VASCULAR/THIN (ENDOMECHANICALS) IMPLANT
RELOAD STAPLE TA45 3.5 REG BLU (ENDOMECHANICALS) IMPLANT
SET IRRIG TUBING LAPAROSCOPIC (IRRIGATION / IRRIGATOR) ×4 IMPLANT
SHEARS HARMONIC ACE PLUS 36CM (ENDOMECHANICALS) ×4 IMPLANT
SLEEVE ENDOPATH XCEL 5M (ENDOMECHANICALS) ×4 IMPLANT
SPECIMEN JAR SMALL (MISCELLANEOUS) ×4 IMPLANT
SUT MNCRL AB 3-0 PS2 18 (SUTURE) ×16 IMPLANT
SUT MON AB 4-0 PC3 18 (SUTURE) ×4 IMPLANT
SUT PDS AB 3-0 SH 27 (SUTURE) IMPLANT
SUT PROLENE 3 0 PS 2 (SUTURE) ×8 IMPLANT
SUT VIC AB 3-0 SH 18 (SUTURE) ×4 IMPLANT
SYR BULB IRRIGATION 50ML (SYRINGE) ×4 IMPLANT
TOWEL OR 17X24 6PK STRL BLUE (TOWEL DISPOSABLE) ×8 IMPLANT
TOWEL OR 17X26 10 PK STRL BLUE (TOWEL DISPOSABLE) ×8 IMPLANT
TRAY FOLEY MTR SLVR 14FR STAT (SET/KITS/TRAYS/PACK) IMPLANT
TRAY LAPAROSCOPIC MC (CUSTOM PROCEDURE TRAY) ×4 IMPLANT
TROCAR XCEL BLUNT TIP 100MML (ENDOMECHANICALS) ×4 IMPLANT
TROCAR XCEL NON-BLD 5MMX100MML (ENDOMECHANICALS) ×4 IMPLANT
TUBE CONNECTING 12'X1/4 (SUCTIONS) ×1
TUBE CONNECTING 12X1/4 (SUCTIONS) ×3 IMPLANT
TUBING INSUFFLATION (TUBING) ×4 IMPLANT
WATER STERILE IRR 1000ML POUR (IV SOLUTION) ×4 IMPLANT

## 2017-08-22 NOTE — Anesthesia Postprocedure Evaluation (Signed)
Anesthesia Post Note  Patient: Michaela Little  Procedure(s) Performed: APPENDECTOMY LAPAROSCOPIC ERAS PATHWAY (N/A Abdomen) REMOVAL OF LEFT TISSUE EXPANDER (Left Chest)     Patient location during evaluation: PACU Anesthesia Type: General Level of consciousness: awake and alert Pain management: pain level controlled Vital Signs Assessment: post-procedure vital signs reviewed and stable Respiratory status: spontaneous breathing, nonlabored ventilation, respiratory function stable and patient connected to nasal cannula oxygen Cardiovascular status: blood pressure returned to baseline and stable Postop Assessment: no apparent nausea or vomiting Anesthetic complications: no    Last Vitals:  Vitals:   08/22/17 1300 08/22/17 1400  BP: 130/73 (!) 106/49  Pulse: 76 76  Resp: 17 16  Temp:    SpO2: 97% 94%    Last Pain:  Vitals:   08/22/17 1400  TempSrc:   PainSc: 0-No pain                 [name]

## 2017-08-22 NOTE — Anesthesia Procedure Notes (Signed)
Procedure Name: Intubation Date/Time: 08/22/2017 7:38 AM Performed by: Gwyndolyn Saxon, CRNA Pre-anesthesia Checklist: Patient identified, Emergency Drugs available, Suction available and Patient being monitored Patient Re-evaluated:Patient Re-evaluated prior to induction Oxygen Delivery Method: Circle System Utilized Preoxygenation: Pre-oxygenation with 100% oxygen Induction Type: IV induction Ventilation: Mask ventilation without difficulty and Oral airway inserted - appropriate to patient size Laryngoscope Size: Mac and 3 Grade View: Grade II Tube type: Oral Number of attempts: 1 Airway Equipment and Method: Stylet and Oral airway (blanket ramp) Placement Confirmation: ETT inserted through vocal cords under direct vision,  positive ETCO2,  breath sounds checked- equal and bilateral and CO2 detector Secured at: 22 (lip) cm Tube secured with: Tape Dental Injury: Teeth and Oropharynx as per pre-operative assessment  Comments: Performed by Azzie Roup SRNA

## 2017-08-22 NOTE — Op Note (Signed)
NAME: Michaela, Little MEDICAL RECORD XQ:[id number] ACCOUNT [id number] DATE OF BIRTH:[birth year] FACILITY: [facility] LOCATION: [facility] PHYSICIAN:Remedios Mckone Ann Held, MD  OPERATIVE REPORT  DATE OF PROCEDURE:  08/22/2017  DATE OF PROCEDURE:  08/22/2017  PREOPERATIVE DIAGNOSIS:  Left breast cancer with bony metastases.  POSTOPERATIVE DIAGNOSIS:  Left breast cancer with bony metastases.  PROCEDURE PERFORMED: 1.  Removal of the left breast tissue expander. 2.  Left capsulectomy periprosthetic (distinct procedure)  SURGEON:  Crissie Reese, MD  ASSISTANT:  Arrie Aran, RNFA  ANESTHESIA:  General.  ESTIMATED BLOOD LOSS:  10 mL.  DRAINS:  One 19-French Blake.  INDICATION:  The patient presents for an appendectomy, laparoscopic by general surgery, and removal of a tissue expander, left chest, that was placed about 7 years ago elsewhere.  She has developed metastatic breast cancer to bones and no longer desires  reconstruction.  She would like to have the tissue expander removed.  It has become extremely uncomfortable to her over time.  The procedure was discussed with her.  She understood the expander would be removed and possibly perform a capsulectomy.  Risks  and possible complications were discussed, and she understood the risks include but not limited to bleeding, infection, healing problems, scarring, loss of sensation, fluid accumulations, anesthesia complications, DVT, PE, pneumothorax, damage to  overlying skin, contour deformity would certainly be present, chronic pain, and overall disappointment.  She understood all this and wished to proceed.  DESCRIPTION OF PROCEDURE:  The patient was marked in the holding area.  She was in the operating room, and the laparoscopic appendectomy had been completed.  She had already been prepped with ChloraPrep and draped with sterile drapes prior to the  appendectomy.  The old mastectomy scar was utilized.  The dissection was carried down through the  subcutaneous tissue.  The underlying pectoralis muscle was identified.  The muscle was opened using a muscle-splitting incision, and the underlying capsule  was then opened.  A serosanguineous seroma was drained, and the tissue expander was then mobilized gently using digital freeing, and then the tissue expander was drained of fluid and it was removed.  The capsule was found to be extremely thickened, and  although it did not have any mass associated with this, it was felt that a capsulectomy was indicated as had been anticipated.  A total capsulectomy was performed with care taken to avoid damage to the underlying chest cavity.  There was one spot where a  little bit of capsule did remain, approximately 1.5 cm in diameter, which was right on the intercostal space and extremely adherent.  It was destroyed using the cautery.  Thorough irrigation with saline.  Antibiotic solution was placed and allowed to  dwell in the space.  Hemostasis with electrocautery.  A 19-French drain was positioned and brought out through a separate stab wound inferolaterally and secured with a 3-0 Prolene suture.  Hemostasis was again confirmed, and the closure was 3-0 Vicryl  simple interrupted sutures for the muscle, followed by 3-0 Monocryl interrupted deep dermal sutures and a running 3-0 Monocryl subcuticular suture.  Dermabond was applied to the incision, and a Biopatch with a Sorbaview dressing to the drain and a dry sterile  dressing with the breast vest, and she was transferred to the recovery room stable, having tolerated the procedure well.  DISPOSITION:  She will be observed overnight.  LN/NUANCE  D:08/22/2017 T:08/22/2017 JOB:[id number]

## 2017-08-22 NOTE — Progress Notes (Signed)
Report given to [facility] rn  as caregiver

## 2017-08-22 NOTE — Brief Op Note (Signed)
08/22/2017  9:44 AM  PATIENT:  Michaela Little  57 y.o. female  PRE-OPERATIVE DIAGNOSIS:  Left breast cancer with bony metastases.   POST-OPERATIVE DIAGNOSIS: Same  PROCEDURE:  (1) Removal left tissue expander                            (2) Left capsulectomy SURGEON:  Surgeon(s) and Role: Panel 1:    Coralie Keens, MD - Primary Panel 2:    * Crissie Reese, MD - Primary  PHYSICIAN ASSISTANT:   ASSISTANTS: Margit Banda, RNFA   ANESTHESIA:   general  EBL:  10 mL   BLOOD ADMINISTERED:none  DRAINS: (1) Jackson-Pratt drain(s) with closed bulb suction in the left chest   LOCAL MEDICATIONS USED:  NONE  SPECIMEN:  Source of Specimen:  Left periprosthetic capsule  DISPOSITION OF SPECIMEN:  PATHOLOGY  COUNTS:  YES  TOURNIQUET:  * No tourniquets in log *  DICTATION: .Other Dictation: Dictation Number [id number]  PLAN OF CARE: Admit for overnight observation  PATIENT DISPOSITION:  PACU - hemodynamically stable.   Delay start of Pharmacological VTE agent (>24hrs) due to surgical blood loss or risk of bleeding: not applicable

## 2017-08-22 NOTE — Interval H&P Note (Signed)
History and Physical Interval Note: no change in H and P  08/22/2017 6:46 AM  Michaela Little  has presented today for surgery, with the diagnosis of enlarged appendix  The various methods of treatment have been discussed with the patient and family. After consideration of risks, benefits and other options for treatment, the patient has consented to  Procedure(s): APPENDECTOMY LAPAROSCOPIC ERAS PATHWAY (N/A) REMOVAL OF LEFT TISSUE EXPANDER (Left) as a surgical intervention .  The patient's history has been reviewed, patient examined, no change in status, stable for surgery.  I have reviewed the patient's chart and labs.  Questions were answered to the patient's satisfaction.     [name]

## 2017-08-22 NOTE — Transfer of Care (Signed)
Immediate Anesthesia Transfer of Care Note  Patient: Michaela Little  Procedure(s) Performed: APPENDECTOMY LAPAROSCOPIC ERAS PATHWAY (N/A Abdomen) REMOVAL OF LEFT TISSUE EXPANDER (Left Chest)  Patient Location: PACU  Anesthesia Type:General  Level of Consciousness: drowsy  Airway & Oxygen Therapy: Patient Spontanous Breathing and Patient connected to face mask oxygen  Post-op Assessment: Report given to RN and Post -op Vital signs reviewed and stable  Post vital signs: Reviewed and stable  Last Vitals:  Vitals Value Taken Time  BP    Temp    Pulse    Resp    SpO2      Last Pain:  Vitals:   08/22/17 0610  TempSrc: Oral  PainSc:       Patients Stated Pain Goal: 3 (27/03/50 0938)  Complications: No apparent anesthesia complications

## 2017-08-22 NOTE — Op Note (Signed)
APPENDECTOMY LAPAROSCOPIC ERAS PATHWAY  Procedure Note  Michaela Little 08/22/2017   Pre-op Diagnosis: enlarged appendix     Post-op Diagnosis: same  Procedure(s): APPENDECTOMY LAPAROSCOPIC   Surgeon:  Dr. Coralie Keens  Anesthesia: General  Staff:  Circulator: Colen Darling, RN Scrub Person: Terri Piedra Circulator Assistant: Paulette Blanch, RN  Estimated Blood Loss: Minimal               Specimens: sent to path  Indications: This is a 57 year old female with metastatic breast cancer.  She has had chronic right lower quadrant abdominal pain and a thickened appendix on multiple CT scans.  After discussion with her oncologist, decision was made to proceed with a lap scopic appendectomy  Findings: The patient was found to have a thickened enlarged appendix without evidence of appendicitis.  No other intra-abdominal pathology was identified  Procedure: The patient was brought to the operating room and identified as the correct patient.  She was placed supine on the operating table general anesthesia was induced.  Her abdomen was then prepped and draped in the usual sterile fashion.  I made a transverse incision at the lower edge of the umbilicus through previous scar.  I carried this down to the fascia which was then opened with scalpel.  Hemostat was then used to pass into the peritoneal cavity under direct vision.  A 0 Vicryl pursestring suture was placed around the fascial opening.  The [facility] port was placed to the opening and insufflation of the abdomen was begun.  I placed a 5 mm trocar in the patient's right upper quadrant and another in the left lower quadrant both under direct vision.  The appendix was then identified and elevated.  It was found to be very thickened and slightly nodular.  I dissected the mesoappendix off with the harmonic scalpel.  I then identified the base of the appendix and transected it with the lap scopic GIA stapler.  The appendix was placed in a  Endosac and removed through the incision at the umbilicus.  We again evaluated the staple line appeared to be intact.  I irrigated the abdomen with saline.  All trochars are removed under direct vision and the abdomen was deflated.  0 Vicryl the umbilicus was tied in place closing the fascial defect.  All incisions were anesthetized Marcaine and closed with 4-0 Monocryl sutures and Dermabond.  The patient tolerated the procedure well.  At this point, Dr. Harlow Mares presented to the room to complete his portion of the procedure.  All counts for my procedure were correct at the end of the case.  Brexlee Heberlein A   Date: 08/22/2017  Time: 8:23 AM

## 2017-08-22 NOTE — Progress Notes (Signed)
Pt admitted from PACU s/p lap AP and removal of tissue expander left breast with breast binder and left JP drain, 3 lap sites skin glued, right hand peripheral IV, alert and oriented , ambulatory, no s/s of pain.

## 2017-08-22 NOTE — Anesthesia Preprocedure Evaluation (Signed)
Anesthesia Evaluation  Patient identified by MRN, date of birth, ID band Patient awake    Reviewed: Allergy & Precautions, H&P , NPO status , Patient's Chart, lab work & pertinent test results  Airway Mallampati: II   Neck ROM: full    Dental   Pulmonary asthma , sleep apnea , former smoker,    breath sounds clear to auscultation       Cardiovascular hypertension,  Rhythm:regular Rate:Normal     Neuro/Psych  Headaches, PSYCHIATRIC DISORDERS Anxiety  Neuromuscular disease    GI/Hepatic GERD  ,  Endo/Other  Morbid obesity  Renal/GU      Musculoskeletal  (+) Arthritis ,   Abdominal   Peds  Hematology  (+) anemia , JEHOVAH'S WITNESS  Anesthesia Other Findings   Reproductive/Obstetrics                             Anesthesia Physical Anesthesia Plan  ASA: III  Anesthesia Plan: General   Post-op Pain Management:    Induction: Intravenous  PONV Risk Score and Plan: 3  Airway Management Planned: Oral ETT  Additional Equipment:   Intra-op Plan:   Post-operative Plan: Extubation in OR  Informed Consent: I have reviewed the patients History and Physical, chart, labs and discussed the procedure including the risks, benefits and alternatives for the proposed anesthesia with the patient or authorized representative who has indicated his/her understanding and acceptance.     Plan Discussed with: CRNA, Anesthesiologist and Surgeon  Anesthesia Plan Comments:         Anesthesia Quick Evaluation

## 2017-08-23 ENCOUNTER — Encounter (HOSPITAL_COMMUNITY): Payer: Self-pay | Admitting: Surgery

## 2017-08-23 DIAGNOSIS — C50912 Malignant neoplasm of unspecified site of left female breast: Secondary | ICD-10-CM | POA: Diagnosis not present

## 2017-08-23 MED ORDER — TRAMADOL HCL 50 MG PO TABS: 50.0000 mg | ORAL_TABLET | Freq: Four times a day (QID) | ORAL | 0 refills | Status: DC | PRN

## 2017-08-23 NOTE — Discharge Summary (Signed)
Physician Discharge Summary  Patient ID: Michaela Little MRN: [id number] DOB/AGE: [birth year] 57 y.o.  Admit date: 08/22/2017 Discharge date: 08/23/2017  Admission Diagnoses:  Discharge Diagnoses:  Active Problems:   S/P laparoscopic appendectomy tissue expander removal  Discharged Condition: good  Hospital Course: uneventful post op recovery.  Discharged pod #1  Consults: None  Significant Diagnostic Studies:   Treatments: surgery: laparoscopic appendectomy Removal left chest tissue expander  Discharge Exam: Blood pressure (!) 165/78, pulse 70, temperature 98 F (36.7 C), temperature source Oral, resp. rate 20, height 5\' 7"  (1.702 m), weight 127.5 kg (281 lb), last menstrual period 02/25/2009, SpO2 100 %. General appearance: alert, cooperative and no distress Resp: clear to auscultation bilaterally Cardio: regular rate and rhythm, S1, S2 normal, no murmur, click, rub or gallop Incision/Wound: incisions clean  Disposition:    Allergies as of 08/23/2017      Reactions   Lisinopril-hydrochlorothiazide Itching   Adhesive [tape] Itching, Other (See Comments)   Redness   Fentanyl    GI upset and drowsiness   Gabapentin Other (See Comments)   Auditory hallucinations   Hctz [hydrochlorothiazide] Itching   Latex Itching   Lisinopril Itching   Losartan Potassium Other (See Comments)   Makes her feel "bad"   Other Other (See Comments)   FRUIT FLAVORED TUMS=HIVES   Oxycodone    GI upset, drowsy      Medication List    TAKE these medications   albuterol 108 (90 Base) MCG/ACT inhaler Commonly known as:  PROVENTIL HFA;VENTOLIN HFA Inhale 2 puffs into the lungs every 6 (six) hours as needed for wheezing or shortness of breath.   amLODipine 10 MG tablet Commonly known as:  NORVASC Take 1 tablet (10 mg total) by mouth daily.   azithromycin 250 MG tablet Commonly known as:  ZITHROMAX Z-PAK Take 2 po the first day then once a day for the next 4 days.   carvedilol 6.25  MG tablet Commonly known as:  COREG Take 1 tablet (6.25 mg total) by mouth 2 (two) times daily with a meal. What changed:    how much to take  when to take this   exemestane 25 MG tablet Commonly known as:  AROMASIN Take 1 tablet (25 mg total) by mouth daily after breakfast.   famotidine 20 MG tablet Commonly known as:  PEPCID Take 1 TABLET at bedtime What changed:    how much to take  how to take this  when to take this  additional instructions   fluticasone 110 MCG/ACT inhaler Commonly known as:  FLOVENT HFA Inhale 2 puffs into the lungs 2 (two) times daily. With spacer What changed:    when to take this  reasons to take this  additional instructions   furosemide 20 MG tablet Commonly known as:  LASIX Take 1 tablet (20 mg total) by mouth daily. What changed:    when to take this  reasons to take this   loratadine 10 MG tablet Commonly known as:  CLARITIN Take 20 mg by mouth daily.   naproxen 500 MG tablet Commonly known as:  NAPROSYN Take 1 tablet (500 mg total) by mouth 2 (two) times daily with a meal. What changed:    when to take this  reasons to take this   nystatin 100000 UNIT/ML suspension Commonly known as:  MYCOSTATIN Take 5 mLs (500,000 Units total) by mouth 4 (four) times daily.   palbociclib 75 MG capsule Commonly known as:  IBRANCE TAKE 1 CAPSULE (75MG ) BY MOUTH  DAILY. TAKE WHOLE WITH FOOD.   pantoprazole 40 MG tablet Commonly known as:  PROTONIX Take 1 tablet (40 mg total) by mouth daily. Take 30-60 min before first meal of the day   promethazine 25 MG tablet Commonly known as:  PHENERGAN Take 1 tablet (25 mg total) by mouth every 6 (six) hours as needed for nausea or vomiting.   ranitidine 300 MG tablet Commonly known as:  ZANTAC Take 1 tablet (300 mg total) by mouth at bedtime. What changed:    when to take this  reasons to take this   tobramycin 0.3 % ophthalmic solution Commonly known as:  TOBREX Place 2 drops  into both eyes every 4 (four) hours.   traMADol 50 MG tablet Commonly known as:  ULTRAM Take 1-2 tablets (50-100 mg total) by mouth every 6 (six) hours as needed for moderate pain or severe pain.      Follow-up Information    Coralie Keens, MD. Schedule an appointment as soon as possible for a visit in 2 week(s).   Specialty:  General Surgery Contact information: [place] [address] [phone]        Crissie Reese, MD. Call.   Specialty:  Plastic Surgery Why:  office will call with follow-up Contact information: [address] [place] [address] [phone]           Signed: Harl Bowie 08/23/2017, 7:21 AM

## 2017-08-23 NOTE — Progress Notes (Signed)
Patient ID: Michaela Little, female   DOB: Dec 20, 1960, 57 y.o.   MRN: [id number]  Comfortable this morning Abdomen soft Chest without hematoma  Plan: D/c home after lunch today

## 2017-08-23 NOTE — Discharge Instructions (Signed)
[facility] ______[facility] °LAPAROSCOPIC SURGERY: POST OP INSTRUCTIONS °Always review your discharge instruction sheet given to you by the facility where your surgery was performed. °IF YOU HAVE DISABILITY OR FAMILY LEAVE FORMS, YOU MUST BRING THEM TO THE OFFICE FOR PROCESSING.   °DO NOT GIVE THEM TO YOUR DOCTOR. ° °1. A prescription for pain medication may be given to you upon discharge.  Take your pain medication as prescribed, if needed.  If narcotic pain medicine is not needed, then you may take acetaminophen (Tylenol) or ibuprofen (Advil) as needed. °2. Take your usually prescribed medications unless otherwise directed. °3. If you need a refill on your pain medication, please contact your pharmacy.  They will contact our office to request authorization. Prescriptions will not be filled after 5pm or on week-ends. °4. You should follow a light diet the first few days after arrival home, such as soup and crackers, etc.  Be sure to include lots of fluids daily. °5. Most patients will experience some swelling and bruising in the area of the incisions.  Ice packs will help.  Swelling and bruising can take several days to resolve.  °6. It is common to experience some constipation if taking pain medication after surgery.  Increasing fluid intake and taking a stool softener (such as Colace) will usually help or prevent this problem from occurring.  A mild laxative (Milk of Magnesia or Miralax) should be taken according to package instructions if there are no bowel movements after 48 hours. °7. Unless discharge instructions indicate otherwise, you may remove your bandages 24-48 hours after surgery, and you may shower at that time.  You may have steri-strips (small skin tapes) in place directly over the incision.  These strips should be left on the skin for 7-10 days.  If your surgeon used skin glue on the incision, you may shower in 24 hours.  The glue will flake off over the next 2-3 weeks.  Any sutures or  staples will be removed at the office during your follow-up visit. °8. ACTIVITIES:  You may resume regular (light) daily activities beginning the next day--such as daily self-care, walking, climbing stairs--gradually increasing activities as tolerated.  You may have sexual intercourse when it is comfortable.  Refrain from any heavy lifting or straining until approved by your doctor. °a. You may drive when you are no longer taking prescription pain medication, you can comfortably wear a seatbelt, and you can safely maneuver your car and apply brakes. °b. RETURN TO WORK:  __________________________________________________________ °9. You should see your doctor in the office for a follow-up appointment approximately 2-3 weeks after your surgery.  Make sure that you call for this appointment within a day or two after you arrive home to insure a convenient appointment time. °10. OTHER INSTRUCTIONS: __________________________________________________________________________________________________________________________ __________________________________________________________________________________________________________________________ °WHEN TO CALL YOUR DOCTOR: °1. Fever over 101.0 °2. Inability to urinate °3. Continued bleeding from incision. °4. Increased pain, redness, or drainage from the incision. °5. Increasing abdominal pain ° °The clinic staff is available to answer your questions during regular business hours.  Please don’t hesitate to call and ask to speak to one of the nurses for clinical concerns.  If you have a medical emergency, go to the nearest emergency room or call 911.  A surgeon from [facility] is always on call at the hospital. °[address], [address], [place], [place]  [address] ? [address] [address], [place], [place]   [address] °[phone] ? [phone] ? FAX [phone] °Web site:   [email or web address] °

## 2017-08-23 NOTE — Progress Notes (Signed)
Pt for discharge today going home health teachings given, next appointment, wound site dry and intact, teach how to empty her left JP drain, discontinued peripheral IV line, given her personal belongings, family at the bedside, no s/s of distress noted at this time.

## 2017-08-23 NOTE — Progress Notes (Signed)
Doing well post capsulectomy and removal of left chest tissue expander. Site looks good. No evidence of bleeding or infection or skin compromise. Drain is functioning and drainage is thin. She will call the office and report the drain output next week.

## 2017-08-27 ENCOUNTER — Encounter (HOSPITAL_COMMUNITY): Payer: Self-pay | Admitting: Radiology

## 2017-08-27 ENCOUNTER — Telehealth (INDEPENDENT_AMBULATORY_CARE_PROVIDER_SITE_OTHER): Payer: Self-pay | Admitting: Physician Assistant

## 2017-08-27 ENCOUNTER — Emergency Department (HOSPITAL_COMMUNITY)
Admission: EM | Admit: 2017-08-27 | Discharge: 2017-08-28 | Disposition: A | Discharge status: Home / Self Care | Payer: Medicare Other | Attending: Emergency Medicine | Admitting: Emergency Medicine

## 2017-08-27 ENCOUNTER — Emergency Department (HOSPITAL_COMMUNITY): Payer: Medicare Other

## 2017-08-27 ENCOUNTER — Other Ambulatory Visit: Payer: Self-pay

## 2017-08-27 DIAGNOSIS — Z87891 Personal history of nicotine dependence: Secondary | ICD-10-CM | POA: Insufficient documentation

## 2017-08-27 DIAGNOSIS — Z9012 Acquired absence of left breast and nipple: Secondary | ICD-10-CM | POA: Diagnosis not present

## 2017-08-27 DIAGNOSIS — R059 Cough, unspecified: Secondary | ICD-10-CM

## 2017-08-27 DIAGNOSIS — R110 Nausea: Secondary | ICD-10-CM | POA: Diagnosis not present

## 2017-08-27 DIAGNOSIS — Z853 Personal history of malignant neoplasm of breast: Secondary | ICD-10-CM | POA: Insufficient documentation

## 2017-08-27 DIAGNOSIS — R509 Fever, unspecified: Secondary | ICD-10-CM

## 2017-08-27 DIAGNOSIS — Z79899 Other long term (current) drug therapy: Secondary | ICD-10-CM | POA: Insufficient documentation

## 2017-08-27 DIAGNOSIS — J45909 Unspecified asthma, uncomplicated: Secondary | ICD-10-CM | POA: Insufficient documentation

## 2017-08-27 DIAGNOSIS — R1030 Lower abdominal pain, unspecified: Secondary | ICD-10-CM | POA: Insufficient documentation

## 2017-08-27 DIAGNOSIS — J189 Pneumonia, unspecified organism: Secondary | ICD-10-CM | POA: Diagnosis not present

## 2017-08-27 DIAGNOSIS — Z91040 Latex allergy status: Secondary | ICD-10-CM | POA: Diagnosis not present

## 2017-08-27 DIAGNOSIS — I10 Essential (primary) hypertension: Secondary | ICD-10-CM | POA: Insufficient documentation

## 2017-08-27 DIAGNOSIS — R05 Cough: Secondary | ICD-10-CM | POA: Insufficient documentation

## 2017-08-27 LAB — CBC WITH DIFFERENTIAL/PLATELET
Abs Immature Granulocytes: 0.2 10*3/uL — ABNORMAL HIGH (ref 0.0–0.1)
BASOS PCT: 0 %
Basophils Absolute: 0 10*3/uL (ref 0.0–0.1)
Eosinophils Absolute: 0.1 10*3/uL (ref 0.0–0.7)
Eosinophils Relative: 1 %
HEMATOCRIT: 34.7 % — AB (ref 36.0–46.0)
HEMOGLOBIN: 11.1 g/dL — AB (ref 12.0–15.0)
Immature Granulocytes: 2 %
LYMPHS ABS: 1.3 10*3/uL (ref 0.7–4.0)
LYMPHS PCT: 19 %
MCH: 27.3 pg (ref 26.0–34.0)
MCHC: 32 g/dL (ref 30.0–36.0)
MCV: 85.5 fL (ref 78.0–100.0)
MONO ABS: 0.6 10*3/uL (ref 0.1–1.0)
MONOS PCT: 8 %
NEUTROS ABS: 4.9 10*3/uL (ref 1.7–7.7)
Neutrophils Relative %: 70 %
Platelets: 260 10*3/uL (ref 150–400)
RBC: 4.06 MIL/uL (ref 3.87–5.11)
RDW: 13.4 % (ref 11.5–15.5)
WBC: 7 10*3/uL (ref 4.0–10.5)

## 2017-08-27 LAB — I-STAT CG4 LACTIC ACID, ED: LACTIC ACID, VENOUS: 0.89 mmol/L (ref 0.5–1.9)

## 2017-08-27 LAB — COMPREHENSIVE METABOLIC PANEL
ALK PHOS: 148 U/L — AB (ref 38–126)
ALT: 43 U/L (ref 14–54)
AST: 62 U/L — AB (ref 15–41)
Albumin: 3.1 g/dL — ABNORMAL LOW (ref 3.5–5.0)
Anion gap: 9 (ref 5–15)
BUN: 12 mg/dL (ref 6–20)
CALCIUM: 8.9 mg/dL (ref 8.9–10.3)
CO2: 27 mmol/L (ref 22–32)
CREATININE: 0.87 mg/dL (ref 0.44–1.00)
Chloride: 102 mmol/L (ref 101–111)
Glucose, Bld: 129 mg/dL — ABNORMAL HIGH (ref 65–99)
Potassium: 4 mmol/L (ref 3.5–5.1)
Sodium: 138 mmol/L (ref 135–145)
Total Bilirubin: 1.4 mg/dL — ABNORMAL HIGH (ref 0.3–1.2)
Total Protein: 7.5 g/dL (ref 6.5–8.1)

## 2017-08-27 LAB — LIPASE, BLOOD: LIPASE: 28 U/L (ref 11–51)

## 2017-08-27 LAB — BRAIN NATRIURETIC PEPTIDE: B Natriuretic Peptide: 29.8 pg/mL (ref 0.0–100.0)

## 2017-08-27 MED ORDER — ACETAMINOPHEN 325 MG PO TABS
650.0000 mg | ORAL_TABLET | Freq: Once | ORAL | Status: AC
  Administered 2017-08-27: 650 mg via ORAL
  Filled 2017-08-27: qty 2

## 2017-08-27 MED ORDER — IOPAMIDOL (ISOVUE-370) INJECTION 76%
INTRAVENOUS | Status: AC
  Filled 2017-08-27: qty 100

## 2017-08-27 MED ORDER — IOPAMIDOL (ISOVUE-370) INJECTION 76%
100.0000 mL | Freq: Once | INTRAVENOUS | Status: AC | PRN
  Administered 2017-08-27: 100 mL via INTRAVENOUS

## 2017-08-27 NOTE — ED Triage Notes (Signed)
Pt from home via [facility]. C/o of increasingly worsening cough, fevers and pain to surgical sites. Hx of surgery a few days ago and bronchitis since before surgery. Coughing yellow/green phlegm. Pain rated @ a 5/10. A&O x4 on arrival.

## 2017-08-27 NOTE — Telephone Encounter (Signed)
Noted  

## 2017-08-27 NOTE — Telephone Encounter (Addendum)
Pt called wanting to let her PCP know she had surgery on 08/22/2017, where they removed some chest tissue

## 2017-08-27 NOTE — ED Notes (Signed)
Urine spec collected Pt alert with family @ bedside

## 2017-08-27 NOTE — ED Provider Notes (Signed)
[facility] Provider Note   CSN: [id number] Arrival date & time: 08/27/17  7510     History   Chief Complaint Chief Complaint  Patient presents with  . Post-op Problem  . Cough    HPI Michaela Little is a 57 y.o. female presenting for evaluation of cough, fevers, and continued pain postop.  Patient states that she had a surgery on May 30, in which she had an appendectomy and left breast capsulectomy.  Since the surgery, she has had worsening cough, which was present prior to surgery.  She reports she is now coughing up green phlegm.  She is having continued fevers, highest of 101.2.  She reports pain at the surgery sites, no pain elsewhere.  She denies shortness of breath, chest pain, vomiting.  She reports some nausea.  She has irritation with urination, but no dysuria, hematuria, urinary frequency.  She states that prior to surgery, she was diagnosed with bronchitis, has been taking albuterol intermittently. PMH of CA (breast with bone mets), HTN, HLD, asthma.   HPI  Past Medical History:  Diagnosis Date  . Anxiety   . Arthritis    hands, knees, hips  . Asthma   . Bronchitis   . Cancer ([place])   . Cervical stenosis (uterine cervix)   . Disorder of appendix    Enlarged  . Dyspnea on exertion   . GERD (gastroesophageal reflux disease)   . Headache(784.0)   . History of breast cancer    2010--  LEFT  s/p  mastectomy (in [place]) AND CHEMORADIATION--  NO RECURRENCE  . History of cervical dysplasia   . Hyperlipidemia   . Hypertension   . Malignant neoplasm of overlapping sites of left breast in female, estrogen receptor positive ([place]) 03/05/2013  . OSA (obstructive sleep apnea) moderate osa per study  09/2010   CPAP  , NOT USING ON REGULAR BASIS  . Pelvic pain in female   . Positive H. pylori test    08-05-2013  . Positive TB test    [place] TE[place]--  [place] W/ MEDS  . Refusal of blood transfusions as patient is Jehovah's Witness   . Seasonal  allergies   . Uterine fibroid   . Wears glasses     Patient Active Problem List   Diagnosis Date Noted  . S/P laparoscopic appendectomy 08/22/2017  . Goals of care, counseling/discussion 01/20/2017  . Multinodular thyroid 02/08/2016  . Lumbar back pain with radiculopathy affecting left lower extremity 02/08/2016  . Carcinoma of breast metastatic to bone ([place]) 02/08/2016  . Bone metastases ([place]) 11/13/2015  . Right thyroid nodule 06/30/2014  . Allergic rhinitis due to pollen 06/30/2014  . Essential hypertension 12/24/2013  . Left ventricular diastolic dysfunction with preserved systolic function 25/85/2778  . Lump in neck 08/11/2013  . Abdominal pain, chronic, bilateral lower quadrant 08/05/2013  . Abdominal pain, epigastric 08/05/2013  . Morbid (severe) obesity due to excess calories ([place]) 06/23/2013  . Allergic rhinitis 06/23/2013  . GERD (gastroesophageal reflux disease) 03/25/2013  . Constipation 03/25/2013  . Other and unspecified hyperlipidemia 03/25/2013  . Dysuria 03/25/2013  . Routine general medical examination at a health care facility 03/25/2013  . Malignant neoplasm of overlapping sites of left breast in female, estrogen receptor positive ([place]) 03/05/2013  . Cough 10/28/2012  . Dyspnea on exertion 10/28/2012  . Chronic abdominal pain 09/02/2012  . Pelvic pain in female 02/06/2012  . Atypical chest pain 11/13/2011  . Hyperlipidemia 11/13/2011  . Dysplasia of  cervix, low grade (CIN 1) 08/22/2011  . Diastolic dysfunction 66/08/3014  . Influenza 03/04/2011  . Hypertension 03/04/2011  . Obesity 03/04/2011  . OSA (obstructive sleep apnea) 02/27/2011  . Asthma 02/08/2011    Past Surgical History:  Procedure Laterality Date  . APPENDECTOMY  y  . CARDIOVASCULAR STRESS TEST  10-29-2012   low risk perfusion study/  no significant reversibity/ ef 66%/  normal wall motion  . CERVICAL CONIZATION W/BX  2012   in  [place] N/A 05/14/2012   Procedure:  LAPAROSCOPIC CHOLECYSTECTOMY;  Surgeon: Ralene Ok, MD;  Location: [facility];  Service: General;  Laterality: N/A;  . COLONOSCOPY  2011   normal per patient - [place]  . DILATION AND CURETTAGE OF UTERUS  10/15/2011   Procedure: DILATATION AND CURETTAGE;  Surgeon: Melina Schools, MD;  Location: [facility];  Service: Gynecology;  Laterality: N/A;  Conization &  endocervical curettings  . EXAMINATION UNDER ANESTHESIA N/A 08/20/2013   Procedure: EXAM UNDER ANESTHESIA;  Surgeon: Margarette Asal, MD;  Location: [facility];  Service: Gynecology;  Laterality: N/A;  . [place]   right  . LAPAROSCOPIC APPENDECTOMY N/A 08/22/2017   Procedure: [place];  Surgeon: Coralie Keens, MD;  Location: [facility];  Service: General;  Laterality: N/A;  . LAPAROSCOPIC ASSISTED VAGINAL HYSTERECTOMY N/A 10/26/2014   Procedure: HYSTERECTOMY ABDOMINAL ;  Surgeon: Molli Posey, MD;  Location: [facility];  Service: Gynecology;  Laterality: N/A;  . MASTECTOMY Left 11/2008  in [place]   . SALPINGOOPHORECTOMY Bilateral 10/26/2014   Procedure: BILATERAL SALPINGO OOPHORECTOMY;  Surgeon: Molli Posey, MD;  Location: [facility];  Service: Gynecology;  Laterality: Bilateral;  . TISSUE EXPANDER PLACEMENT Left 08/22/2017   Procedure: REMOVAL OF LEFT TISSUE EXPANDER;  Surgeon: Crissie Reese, MD;  Location: [facility];  Service: Plastics;  Laterality: Left;  . TISSUE EXPANDER REMOVAL Left 08/22/2017  . TRANSTHORACIC ECHOCARDIOGRAM  10-29-2012   mild lvh/  ef 60-65%/  grade II diastolic dysfunction/  trivial mr  &  tr     OB History    Gravida  10   Para  5   Term  5   Preterm      AB  5   Living  5     SAB  3   TAB  2   Ectopic      Multiple      Live Births               Home Medications    Prior to Admission medications   Medication Sig Start Date End Date Taking? Authorizing Provider  albuterol (PROVENTIL HFA;VENTOLIN HFA) 108 (90 Base) MCG/ACT  inhaler Inhale 2 puffs into the lungs every 6 (six) hours as needed for wheezing or shortness of breath.   Yes [provider]  amLODipine (NORVASC) 10 MG tablet Take 1 tablet (10 mg total) by mouth daily. 12/17/16  Yes Clent Demark, PA-C  carvedilol (COREG) 6.25 MG tablet Take 1 tablet (6.25 mg total) by mouth 2 (two) times daily with a meal. Patient taking differently: Take 12.5 mg by mouth daily.  12/17/16  Yes Clent Demark, PA-C  exemestane (AROMASIN) 25 MG tablet Take 1 tablet (25 mg total) by mouth daily after breakfast. 07/08/17  Yes [name], MD  famotidine (PEPCID) 20 MG tablet Take 1 TABLET at bedtime Patient taking differently: Take 20 mg by mouth at bedtime.  02/19/17  Yes [name], MD  fluticasone (FLOVENT HFA) 110 MCG/ACT inhaler Inhale 2 puffs into the lungs 2 (two) times daily. With spacer Patient taking differently: Inhale 2 puffs into the lungs 2 (two) times daily as needed (for respiratory issues.). With spacer 12/05/16  Yes Kozlow, Donnamarie Poag, MD  furosemide (LASIX) 20 MG tablet Take 1 tablet (20 mg total) by mouth daily. Patient taking differently: Take 20 mg by mouth daily as needed (for fluid retention.).  12/17/16  Yes Clent Demark, PA-C  loratadine (CLARITIN) 10 MG tablet Take 20 mg by mouth daily.    Yes [provider]  naproxen (NAPROSYN) 500 MG tablet Take 1 tablet (500 mg total) by mouth 2 (two) times daily with a meal. Patient taking differently: Take 500 mg by mouth 2 (two) times daily as needed (for pain.).  04/12/17  Yes Clent Demark, PA-C  nystatin (MYCOSTATIN) 100000 UNIT/ML suspension Take 5 mLs (500,000 Units total) by mouth 4 (four) times daily. 08/08/17  Yes [name], MD  palbociclib [facility]) 75 MG capsule TAKE 1 CAPSULE (75MG ) BY MOUTH DAILY. TAKE WHOLE WITH FOOD. 08/13/17  Yes [name], MD  pantoprazole (PROTONIX) 40 MG tablet Take 1 tablet (40 mg total) by mouth daily. Take 30-60 min before first meal of the day  01/01/17  Yes [name], MD  promethazine (PHENERGAN) 25 MG tablet Take 1 tablet (25 mg total) by mouth every 6 (six) hours as needed for nausea or vomiting. 07/31/17  Yes [name], MD  ranitidine (ZANTAC) 300 MG tablet Take 1 tablet (300 mg total) by mouth at bedtime. Patient taking differently: Take 300 mg by mouth at bedtime as needed (for indigestion.).  12/05/16  Yes Kozlow, Donnamarie Poag, MD  traMADol (ULTRAM) 50 MG tablet Take 1-2 tablets (50-100 mg total) by mouth every 6 (six) hours as needed for moderate pain or severe pain. 08/23/17  Yes Coralie Keens, MD  azithromycin (ZITHROMAX Z-PAK) 250 MG tablet Take 2 po the first day then once a day for the next 4 days. Patient not taking: Reported on 08/27/2017 08/13/17   Rolland Porter, MD  benzonatate (TESSALON) 100 MG capsule Take 1 capsule (100 mg total) by mouth every 8 (eight) hours. 08/28/17   Heith Haigler, PA-C  levofloxacin (LEVAQUIN) 750 MG tablet Take 1 tablet (750 mg total) by mouth daily for 5 days. 08/28/17 09/02/17  Rylie Knierim, PA-C  promethazine-dextromethorphan (PROMETHAZINE-DM) 6.25-15 MG/5ML syrup Take 5 mLs by mouth 4 (four) times daily as needed. 08/28/17   Aerika Groll, PA-C  tobramycin (TOBREX) 0.3 % ophthalmic solution Place 2 drops into both eyes every 4 (four) hours. Patient not taking: Reported on 08/09/2017 06/13/17   Nat Christen, MD    Family History Family History  Problem Relation Age of Onset  . Breast cancer Mother   . Colon cancer Mother   . Hypotension Mother   . Asthma Mother   . Diabetes type II Mother   . Arthritis Mother   . Clotting disorder Mother   . Cancer Mother        breast/colon  . Mental illness Brother   . Heart disease Brother   . Cerebral palsy Daughter   . Emphysema Brother        never smoker  . Colon cancer Maternal Aunt   . Cancer Maternal Aunt        colon  . Colon cancer Maternal Uncle   . Cancer Maternal Uncle  colon  . Esophageal cancer Neg Hx   . Rectal cancer  Neg Hx   . Stomach cancer Neg Hx   . Thyroid disease Neg Hx     Social History Social History   Tobacco Use  . Smoking status: Former Smoker    Packs/day: 0.30    Years: 10.00    Pack years: 3.00    Types: Cigarettes    Last attempt to quit: 03/26/1988    Years since quitting: 29.4  . Smokeless tobacco: Never Used  Substance Use Topics  . Alcohol use: No  . Drug use: No     Allergies   Lisinopril-hydrochlorothiazide; Adhesive [tape]; Fentanyl; Gabapentin; Hctz [hydrochlorothiazide]; Latex; Lisinopril; Losartan potassium; Other; and Oxycodone   Review of Systems Review of Systems  Constitutional: Positive for fever.  Respiratory: Positive for cough.   Gastrointestinal: Positive for nausea.  All other systems reviewed and are negative.    Physical Exam Updated Vital Signs BP (!) 147/77   Pulse 83   Temp (!) 100.6 F (38.1 C) (Rectal)   Resp 19   Ht 5\' 7"  (1.702 m)   Wt 127.5 kg (281 lb)   LMP 02/25/2009   SpO2 96%   BMI 44.01 kg/m   Physical Exam  Constitutional: She is oriented to person, place, and time. She appears well-developed and well-nourished. No distress.  Pt appears in NAD  HENT:  Head: Normocephalic and atraumatic.  Eyes: Pupils are equal, round, and reactive to light. Conjunctivae and EOM are normal.  Neck: Normal range of motion. Neck supple.  Cardiovascular: Normal rate, regular rhythm and intact distal pulses.  Pulmonary/Chest: Effort normal and breath sounds normal. No respiratory distress. She has no wheezes.  Clear lung sounds in all fields. Wet sounding cough. Speaking in full sentences. Well-healing incision of the left breast without drainage, erythema, or warmth  Abdominal: Soft. She exhibits no distension and no mass. There is no tenderness. There is no guarding.  Well-healing incisions of the abdomen without erythema, drainage, or warmth.  Mild tenderness surrounding incision sites without rigidity, guarding, or distention.    Musculoskeletal: Normal range of motion. She exhibits no edema or tenderness.  No leg pain or swelling  Neurological: She is alert and oriented to person, place, and time.  Skin: Skin is warm and dry.  Psychiatric: She has a normal mood and affect.  Nursing note and vitals reviewed.    ED Treatments / Results  Labs (all labs ordered are listed, but only abnormal results are displayed) Labs Reviewed  CBC WITH DIFFERENTIAL/PLATELET - Abnormal; Notable for the following components:      Result Value   Hemoglobin 11.1 (*)    HCT 34.7 (*)    Abs Immature Granulocytes 0.2 (*)    All other components within normal limits  COMPREHENSIVE METABOLIC PANEL - Abnormal; Notable for the following components:   Glucose, Bld 129 (*)    Albumin 3.1 (*)    AST 62 (*)    Alkaline Phosphatase 148 (*)    Total Bilirubin 1.4 (*)    All other components within normal limits  URINALYSIS, ROUTINE W REFLEX MICROSCOPIC - Abnormal; Notable for the following components:   Specific Gravity, Urine >1.046 (*)    All other components within normal limits  URINE CULTURE  LIPASE, BLOOD  BRAIN NATRIURETIC PEPTIDE  I-STAT CG4 LACTIC ACID, ED  I-STAT CG4 LACTIC ACID, ED    EKG EKG Interpretation  Date/Time:  Tuesday August 27 2017 19:15:19 EDT Ventricular Rate:  91 PR Interval:    QRS Duration: 81 QT Interval:  348 QTC Calculation: 429 R Axis:   15 Text Interpretation:  Sinus rhythm similar to previous Confirmed by Theotis Burrow [phone]) on 08/27/2017 7:51:18 PM   Radiology Dg Chest 2 View  Result Date: 08/27/2017 CLINICAL DATA:  Cough and fever for 1 day. EXAM: CHEST - 2 VIEW COMPARISON:  08/13/2017 and prior radiographs FINDINGS: Cardiomegaly and mild chronic peribronchial thickening again noted. There is no evidence of focal airspace disease, pulmonary edema, suspicious pulmonary nodule/mass, pleural effusion, or pneumothorax. No acute bony abnormalities are identified. IMPRESSION: Cardiomegaly without  evidence of acute cardiopulmonary disease. Electronically Signed   By: Margarette Canada M.D.   On: 08/27/2017 20:21   Ct Angio Chest Pe W/cm &/or Wo Cm  Result Date: 08/27/2017 CLINICAL DATA:  Chest pain when coughing. Productive cough. Periumbilical and lower abdominal pain. EXAM: CT ANGIOGRAPHY CHEST CT ABDOMEN AND PELVIS WITH CONTRAST TECHNIQUE: Multidetector CT imaging of the chest was performed using the standard protocol during bolus administration of intravenous contrast. Multiplanar CT image reconstructions and MIPs were obtained to evaluate the vascular anatomy. Multidetector CT imaging of the abdomen and pelvis was performed using the standard protocol during bolus administration of intravenous contrast. CONTRAST:  169mL ISOVUE-370 IOPAMIDOL (ISOVUE-370) INJECTION 76% COMPARISON:  Body CT 08/12/2017 FINDINGS: CTA CHEST FINDINGS Cardiovascular: Satisfactory opacification of the pulmonary arteries to the segmental level. No evidence of pulmonary embolism. Normal heart size. Small pericardial effusion. Mediastinum/Nodes: No enlarged mediastinal, hilar, or axillary lymph nodes. Thyroid gland, trachea, and esophagus demonstrate no significant findings. Lungs/Pleura: Lungs are clear. No pleural effusion or pneumothorax. Musculoskeletal: Post recent left mastectomy with surgical drain in place. Review of the MIP images confirms the above findings. CT ABDOMEN and PELVIS FINDINGS Hepatobiliary: No focal liver abnormality is seen. Status post cholecystectomy. No biliary dilatation. Pancreas: Unremarkable. No pancreatic ductal dilatation or surrounding inflammatory changes. Spleen: Normal in size without focal abnormality. Adrenals/Urinary Tract: Adrenal glands are unremarkable. Kidneys are without renal calculi, solid lesion, or hydronephrosis. 1.8 cm right renal cyst. Bladder is unremarkable. Stomach/Bowel: Stomach is within normal limits. Post appendectomy. No evidence of bowel wall thickening, distention, or  inflammatory changes. Vascular/Lymphatic: Mild calcific atherosclerotic disease of the aorta. No evidence of lymphadenopathy. Reproductive: Status post hysterectomy. No adnexal masses. Other: Small focus of gas and subcutaneous fat stranding within the anterior abdominal wall immediately inferior to the umbilicus. Musculoskeletal: No acute or significant osseous findings. Review of the MIP images confirms the above findings. IMPRESSION: No evidence of pulmonary embolus. Small pericardial effusion with uncertain clinical significance. No evidence of acute abnormalities within the chest, abdomen or pelvis. Area of fat stranding and small focus of gas within the anterior abdominal wall, immediately inferior to the umbilicus, probably at the site of laparoscopic access. Electronically Signed   By: Fidela Salisbury M.D.   On: 08/27/2017 22:55   Ct Abdomen Pelvis W Contrast  Result Date: 08/27/2017 CLINICAL DATA:  Chest pain when coughing. Productive cough. Periumbilical and lower abdominal pain. EXAM: CT ANGIOGRAPHY CHEST CT ABDOMEN AND PELVIS WITH CONTRAST TECHNIQUE: Multidetector CT imaging of the chest was performed using the standard protocol during bolus administration of intravenous contrast. Multiplanar CT image reconstructions and MIPs were obtained to evaluate the vascular anatomy. Multidetector CT imaging of the abdomen and pelvis was performed using the standard protocol during bolus administration of intravenous contrast. CONTRAST:  128mL ISOVUE-370 IOPAMIDOL (ISOVUE-370) INJECTION 76% COMPARISON:  Body CT 08/12/2017 FINDINGS: CTA CHEST FINDINGS Cardiovascular: Satisfactory  opacification of the pulmonary arteries to the segmental level. No evidence of pulmonary embolism. Normal heart size. Small pericardial effusion. Mediastinum/Nodes: No enlarged mediastinal, hilar, or axillary lymph nodes. Thyroid gland, trachea, and esophagus demonstrate no significant findings. Lungs/Pleura: Lungs are clear. No  pleural effusion or pneumothorax. Musculoskeletal: Post recent left mastectomy with surgical drain in place. Review of the MIP images confirms the above findings. CT ABDOMEN and PELVIS FINDINGS Hepatobiliary: No focal liver abnormality is seen. Status post cholecystectomy. No biliary dilatation. Pancreas: Unremarkable. No pancreatic ductal dilatation or surrounding inflammatory changes. Spleen: Normal in size without focal abnormality. Adrenals/Urinary Tract: Adrenal glands are unremarkable. Kidneys are without renal calculi, solid lesion, or hydronephrosis. 1.8 cm right renal cyst. Bladder is unremarkable. Stomach/Bowel: Stomach is within normal limits. Post appendectomy. No evidence of bowel wall thickening, distention, or inflammatory changes. Vascular/Lymphatic: Mild calcific atherosclerotic disease of the aorta. No evidence of lymphadenopathy. Reproductive: Status post hysterectomy. No adnexal masses. Other: Small focus of gas and subcutaneous fat stranding within the anterior abdominal wall immediately inferior to the umbilicus. Musculoskeletal: No acute or significant osseous findings. Review of the MIP images confirms the above findings. IMPRESSION: No evidence of pulmonary embolus. Small pericardial effusion with uncertain clinical significance. No evidence of acute abnormalities within the chest, abdomen or pelvis. Area of fat stranding and small focus of gas within the anterior abdominal wall, immediately inferior to the umbilicus, probably at the site of laparoscopic access. Electronically Signed   By: Fidela Salisbury M.D.   On: 08/27/2017 22:55    Procedures Procedures (including critical care time)  Medications Ordered in ED Medications  iopamidol (ISOVUE-370) 76 % injection (has no administration in time range)  acetaminophen (TYLENOL) tablet 650 mg (650 mg Oral Given 08/27/17 2045)  iopamidol (ISOVUE-370) 76 % injection 100 mL (100 mLs Intravenous Contrast Given 08/27/17 2148)      Initial Impression / Assessment and Plan / ED Course  I have reviewed the triage vital signs and the nursing notes.  Pertinent labs & imaging results that were available during my care of the patient were reviewed by me and considered in my medical decision making (see chart for details).     Pt presenting for evaluation of cough, fever, and continued post-op pain. Physical exam shows pt who has a low grade fever (100.6) but is not tachycardic and appears nontoxic. Has wet cough, otherwise pulm exam reassuring. Incisions appear to be healing well. Will obtain labs, CXR, CTA to r/o PE, and Ct abd/pelvis to assess post-op status.   Labs reassuring, no leukocytosis, lactic negative. SCr stable. Liver enzymes mildly elevated from previous. Hgb stable. BNP normal. CXR shows cardiomegaly. EKG without stemi. CTA without PE or PNA. CT abd shows post-op changes without acute findings. At this time, doubt CHF/fluid overload, ACS, PE, sepsis. As she is having new fevers and worsening cough, will place on abx. Discussed albuterol use and cough suppressants. F/u with PCP on Friday, has apt with surgery on Fri. Case discussed with attending, Dr. Rex Kras agrees to plan. At this time, pt appears safe for d/c. Return precautions given. Pt states she understands and agrees to plan.    Final Clinical Impressions(s) / ED Diagnoses   Final diagnoses:  Cough  Fever, unspecified fever cause    ED Discharge Orders        Ordered    levofloxacin (LEVAQUIN) 750 MG tablet  Daily     08/28/17 0022    promethazine-dextromethorphan (PROMETHAZINE-DM) 6.25-15 MG/5ML syrup  4 times daily  PRN     08/28/17 0023    benzonatate (TESSALON) 100 MG capsule  Every 8 hours     08/28/17 0023       Franchot Heidelberg, PA-C 08/28/17 0108    Little, Wenda Overland, MD 08/28/17 1434

## 2017-08-28 ENCOUNTER — Telehealth (INDEPENDENT_AMBULATORY_CARE_PROVIDER_SITE_OTHER): Payer: Self-pay

## 2017-08-28 LAB — URINALYSIS, ROUTINE W REFLEX MICROSCOPIC
Bilirubin Urine: NEGATIVE
GLUCOSE, UA: NEGATIVE mg/dL
HGB URINE DIPSTICK: NEGATIVE
KETONES UR: NEGATIVE mg/dL
Leukocytes, UA: NEGATIVE
Nitrite: NEGATIVE
PH: 6 (ref 5.0–8.0)
Protein, ur: NEGATIVE mg/dL
Specific Gravity, Urine: >1.046 — ABNORMAL HIGH (ref 1.005–1.030)

## 2017-08-28 MED ORDER — LEVOFLOXACIN 750 MG PO TABS: 750.0000 mg | ORAL_TABLET | Freq: Every day | ORAL | 0 refills | Status: AC

## 2017-08-28 MED ORDER — BENZONATATE 100 MG PO CAPS: 100.0000 mg | ORAL_CAPSULE | Freq: Three times a day (TID) | ORAL | 0 refills | Status: DC

## 2017-08-28 MED ORDER — PROMETHAZINE-DM 6.25-15 MG/5ML PO SYRP: 5.0000 mL | ORAL_SOLUTION | Freq: Four times a day (QID) | ORAL | 0 refills | Status: DC | PRN

## 2017-08-28 NOTE — ED Notes (Signed)
Signature pad not working @ this time.

## 2017-08-28 NOTE — Telephone Encounter (Signed)
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 Please be sure to address this with her oncologist.

## 2017-08-28 NOTE — Telephone Encounter (Signed)
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 Patient had appendectomy on 08/22/2017, but will still notify oncologist of surgical pathology results. Nat Christen, CMA

## 2017-08-28 NOTE — ED Notes (Signed)
IV removed, cathater intact with site clean and dry, no bleeding or bruising noted

## 2017-08-28 NOTE — Discharge Instructions (Addendum)
Take antibiotics as prescribed.  Take the entire course, even if your symptoms improve. Use your inhaler ever 2-4 hours for the next 2 days, and then only as needed.  Use cough syrup and/or drops as needed for cough.  Continue to use tylenol and ibuprofen as needed for pain and fever.  Follow-up with your primary care doctor on Friday for further evaluation of your symptoms.  Keep your scheduled appointment with your surgeon for further evaluation of your incisions.  Continue using the incentive spirometry. Return to the emergency room if you develop any new or concerning symptoms.

## 2017-08-29 LAB — URINE CULTURE

## 2017-09-02 ENCOUNTER — Telehealth (INDEPENDENT_AMBULATORY_CARE_PROVIDER_SITE_OTHER): Payer: Self-pay | Admitting: Physician Assistant

## 2017-09-02 NOTE — Telephone Encounter (Signed)
Sorry but I do not refill Tramadol without office visit.

## 2017-09-02 NOTE — Telephone Encounter (Signed)
Pt called to request a refill on her traMADol to [facility] on [address] [place], [place] [address] Because she states she lost the printed prescription they gave her at the ED. Please follow up

## 2017-09-02 NOTE — Telephone Encounter (Signed)
Patient verified DOB Patient is aware of no controlled substances being provided until an OV is completed. Patient was advised to file a police report and request a refill from the ED. Patient states she will ask another provider that she is seeing tomorrow. No further questions.

## 2017-09-03 NOTE — Progress Notes (Signed)
[place]  Telephone:(336) [phone] Fax:(336) [phone]  Clinic Follow up Note   Patient Care Team: [name] as PCP - General (Physician Assistant) [name], MD as Consulting Physician (Pulmonary Disease) [name], MD as Consulting Physician (Hematology)   Date of Service:  09/05/2017   CHIEF COMPLAINT: F/U metastatic breast cancer   SUMMARY OF ONCOLOGIC HISTORY: Oncology History   Cancer Staging Malignant neoplasm of overlapping sites of left breast in female, estrogen receptor positive ([place]) Staging form: Breast, AJCC 7th Edition - Clinical: Stage IIIA (T2, N2, M0) - Unsigned       Malignant neoplasm of overlapping sites of left breast in female, estrogen receptor positive ([place])   05/2008 Cancer Diagnosis    Left sided breast cancer (no path report available)      11/2008 Pathologic Stage    Stage IIIA: T2 N2       Neo-Adjuvant Chemotherapy    Paclitaxel weekly x 12; doxorubicin and cyclophosphamide x 4 - both given with trifiparnib (treated at [facility] in [place])      11/2008 Definitive Surgery    Left modified radiation mastectomy: invasive lobular carcinoma, ER+, PR+, HER2/neu negative, 5/22 LN positive       - 03/2009 Radiation Therapy    Adjuvant radiation to left chest wall      2011 - 08/2014 Anti-estrogen oral therapy    Tamoxifen 20 mg used briefly; discontinued due to uterine lining concerns; changed to anastrozole 1 mg daily (began 07/2100); discontinued by patient summer of 2016      05/26/2015 Progression    Iliac bone biopsy showed metastatic carcinoma consistent with a breast primary, ER positive, PR and HER-2 negative. Her staging CT, and a PET scan was negative for visceral metastasis.      05/2015 -  Anti-estrogen oral therapy    1. letrozole and Ibrance started March 2017             -Ibrance dose decreased to 75 mg daily  -Letrozole switched to Exemestane on 02/13/17 due to b/l rib pain.       11/16/2015 Miscellaneous    Xgeva monthly for bone metastasis       07/03/2016 Imaging    CT chest, abdomen and pelvis with contrast showed no evidence of metastasis.       02/07/2017 Imaging    CT AP W Contrast 02/07/17 IMPRESSION: 1. No CT findings for abdominal/pelvic metastatic disease. 2. No acute abdominal findings, mass lesions or adenopathy. 3. Status post cholecystectomy with mild associated common bile duct dilatation. 4. Somewhat thickened appendix appears relatively stable. No acute inflammatory process. 5. Stable mixed lytic and sclerotic osseous metastatic disease.       02/07/2017 Imaging    Bone Scan Whole Body 02/07/17 IMPRESSION: No scintigraphic evidence skeletal metastasis       08/12/2017 Imaging    Whole Boday Scan 08/12/17 IMPRESSION: Scattered degenerative type uptake as above with a questionable focus of increased tracer localization at L2 vertebral body versus artifact; no abnormality is seen at this site by CT. Consider either characterization by MR or attention on follow-up imaging.      08/12/2017 Imaging    CT AP W Contrast 08/12/17  IMPRESSION: Continued thickening of the appendix is noted without surrounding inflammation. It has maximum measured diameter is decreased compared to prior exam. There is no evidence of acute inflammation. Stable mixed lytic and sclerotic appearance of visualized skeleton is noted consistent with history of known osseous  metastases. No acute abnormality seen in the abdomen or pelvis.      08/22/2017 Surgery    APPENDECTOMY LAPAROSCOPIC ERAS PATHWAY by Dr. Malcolm Metro and REMOVAL OF LEFT TISSUE EXPANDER by Dr. Harlow Mares 08/22/17      08/22/2017 Pathology Results    Diagnosis 08/22/17  1. Appendix, Other than Incidental - METASTATIC CARCINOMA, CONSISTENT WITH BREAST PRIMARY. - CARCINOMA IS PRESENT AT THE SURGICAL RESECTION MARGIN AS WELL AS THE SEROSAL SURFACE. - LYMPHOVASCULAR INVASION IS IDENTIFIED. - SEE  COMMENT. 2. Breast, capsule, Left - DENSE FIBROUS TISSUE WITH MILD INFLAMMATION, INCLUDING A FEW SCATTERED MULTINUCLEATED GIANT CELLS. - THERE IS NO EVIDENCE OF MALIGNANCY.      08/27/2017 Imaging    CT AP W Contrast 08/27/17 IMPRESSION: No evidence of pulmonary embolus. Small pericardial effusion with uncertain clinical significance. No evidence of acute abnormalities within the chest, abdomen or pelvis. Area of fat stranding and small focus of gas within the anterior abdominal wall, immediately inferior to the umbilicus, probably at the site of laparoscopic access.       INTERIM HISTORY: 01/17/17 Michaela Little is here for a follow up of her metastatic breast cancer. I am taking over her care from Dr. Jana Hakim. This is my first time seeing her.   Today she is on her off week of Ibrance and is on 23m. She notes she feels exhausted and is often SOB. She has been having right sided abdominal pain that was worse 2 weeks ago, she say symptom management clinic and ER. She has been having ongoing pain in the right groin area but worsened lately. She is also having pain around her left lower ribs. She has not tried gabapentin and she takes tramadol as needed for pain. Right now her pain is tolerable. Sometime her pain can be past 10/10 but now the pain is 6-7/10 She is taking her BP medication for her HTN, she started Flovent due to her current trouble breathing, given by Dr. WMelvyn Novas Her legs swell but no doppler has been done before. She also gets a lot of fluid in her stomach. She is also on Lasix, given by her PCP, Dr. GAltamease Oiler She has arthritis. She still has tissue expander in left breast where she has some pain and tenderness. Her PCP changed her heart medication because her heart rate was slow when he last saw her. She would like to get to the bottom of her issues as she has difficulty falling asleep at night.  She [place] by herself in a community residence[place] She has 5 children. She does not drive. She does have SCAT forms to be filled out.    CURRENT TREATMENT:    1. letrozole and Ibrance started March 2017   -Ibrance dose decreased to 75 mg daily, held on 06/03/2017 due to surgery, plan to restart around 09/19/17   -Letrozole switched to Exemestane on 02/13/17 due to b/l rib pain.  2. Xgeva started 11/16/2015, repeated monthly   INTERVAL HISTORY:   CCHASSITY LUDKEis here for a follow up of her metastatic breast cancer after appendectomy and breast reconstruction to remove her breast implant on 08/22/17. She presents to the clinic today by herself.  She notes she has not reviewed her pathology much. She notes assuming her abdominal pain has been there for years and attributes this pain to cancer that has been there for long time. She denies being depressed and  upset about her cancer diagnosis from appendix.  On review of symptoms, pt notes cough and SOB with mild exertion.    REVIEW OF SYSTEMS:   Constitutional: Denies fevers, chills or abnormal weight loss  Eyes: Denies blurriness of vision Ears, nose, mouth, throat, and face: Denies mucositis, sore throat  Respiratory: Denies cough or wheezes (+) SOB (+) Cough  Cardiovascular: Denies palpitation, chest discomfort (+) Slight lower leg edema Gastrointestinal:  Denies nausea, heartburn or change in bowel habits  MSK: (+) back pain, stable Breast: (+) breast pain from surgery Skin: Denies abnormal skin rashes Lymphatics: Denies new lymphadenopathy or easy bruising Neurological:Denies numbness, tingling or new weaknesses Behavioral/Psych: Mood is stable, no new changes  All other systems were reviewed with the patient and are negative.  MEDICAL HISTORY:  Past Medical History:  Diagnosis Date  . Anxiety   . Arthritis    hands, knees, hips  . Asthma   . Bronchitis   . Cancer ([place])   . Cervical stenosis (uterine cervix)   . Disorder of appendix     Enlarged  . Dyspnea on exertion   . GERD (gastroesophageal reflux disease)   . Headache(784.0)   . History of breast cancer    2010--  LEFT  s/p  mastectomy (in [place]) AND CHEMORADIATION--  NO RECURRENCE  . History of cervical dysplasia   . Hyperlipidemia   . Hypertension   . Malignant neoplasm of overlapping sites of left breast in female, estrogen receptor positive ([place]) 03/05/2013  . OSA (obstructive sleep apnea) moderate osa per study  09/2010   CPAP  , NOT USING ON REGULAR BASIS  . Pelvic pain in female   . Positive H. pylori test    08-05-2013  . Positive TB test    [place] TE[place]--  [place]/ MEDS  . Refusal of blood transfusions as patient is Jehovah's Witness   . Seasonal allergies   . Uterine fibroid   . Wears glasses     SURGICAL HISTORY: Past Surgical History:  Procedure Laterality Date  . APPENDECTOMY  y  . CARDIOVASCULAR STRESS TEST  10-29-2012   low risk perfusion study/  no significant reversibity/ ef 66%/  normal wall motion  . CERVICAL CONIZATION W/BX  2012   in  [place] N/A 05/14/2012   Procedure: LAPAROSCOPIC CHOLECYSTECTOMY;  Surgeon: Ralene Ok, MD;  Location: [facility];  Service: General;  Laterality: N/A;  . COLONOSCOPY  2011   normal per patient - [place]  . DILATION AND CURETTAGE OF UTERUS  10/15/2011   Procedure: DILATATION AND CURETTAGE;  Surgeon: Melina Schools, MD;  Location: [facility];  Service: Gynecology;  Laterality: N/A;  Conization &  endocervical curettings  . EXAMINATION UNDER ANESTHESIA N/A 08/20/2013   Procedure: EXAM UNDER ANESTHESIA;  Surgeon: Margarette Asal, MD;  Location[facility];  Service: Gynecology;  Laterality: N/A;  . [place]   right  . LAPAROSCOPIC APPENDECTOMY N/A 08/22/2017   Procedure: [place];  Surgeon: Coralie Keens, MD;  Location: [facility];  Service: General;  Laterality: N/A;  . LAPAROSCOPIC ASSISTED VAGINAL HYSTERECTOMY N/A 10/26/2014   Procedure: HYSTERECTOMY  ABDOMINAL ;  Surgeon: Molli Posey, MD;  Location: [facility];  Service: Gynecology;  Laterality: N/A;  . MASTECTOMY Left 11/2008  in [place]   . SALPINGOOPHORECTOMY Bilateral 10/26/2014   Procedure: BILATERAL SALPINGO OOPHORECTOMY;  Surgeon: Molli Posey, MD;  Location: [facility];  Service: Gynecology;  Laterality: Bilateral;  . TISSUE EXPANDER PLACEMENT Left 08/22/2017   Procedure: REMOVAL OF LEFT TISSUE EXPANDER;  Surgeon: Crissie Reese, MD;  Location: [facility];  Service: Plastics;  Laterality: Left;  . TISSUE EXPANDER REMOVAL Left 08/22/2017  . TRANSTHORACIC ECHOCARDIOGRAM  10-29-2012   mild lvh/  ef 60-65%/  grade II diastolic dysfunction/  trivial mr  &  tr    I have reviewed the social history and family history with the patient and they are unchanged from previous note.  ALLERGIES:  is allergic to lisinopril-hydrochlorothiazide; adhesive [tape]; fentanyl; gabapentin; hctz [hydrochlorothiazide]; latex; lisinopril; losartan potassium; other; and oxycodone.  MEDICATIONS:  Current Outpatient Medications  Medication Sig Dispense Refill  . albuterol (PROVENTIL HFA;VENTOLIN HFA) 108 (90 Base) MCG/ACT inhaler Inhale 2 puffs into the lungs every 6 (six) hours as needed for wheezing or shortness of breath.    Marland Kitchen amLODipine (NORVASC) 10 MG tablet Take 1 tablet (10 mg total) by mouth daily. 90 tablet 3  . azithromycin (ZITHROMAX Z-PAK) 250 MG tablet Take 2 po the first day then once a day for the next 4 days. 6 tablet 0  . carvedilol (COREG) 6.25 MG tablet Take 1 tablet (6.25 mg total) by mouth 2 (two) times daily with a meal. (Patient taking differently: Take 12.5 mg by mouth daily. ) 180 tablet 3  . exemestane (AROMASIN) 25 MG tablet Take 1 tablet (25 mg total) by mouth daily after breakfast. 90 tablet 1  . famotidine (PEPCID) 20 MG tablet Take 1 TABLET at bedtime (Patient taking differently: Take 20 mg by mouth at bedtime. ) 30 tablet 2  . fluticasone (FLOVENT HFA) 110 MCG/ACT inhaler  Inhale 2 puffs into the lungs 2 (two) times daily. With spacer (Patient taking differently: Inhale 2 puffs into the lungs 2 (two) times daily as needed (for respiratory issues.). With spacer) 1 Inhaler 5  . furosemide (LASIX) 20 MG tablet Take 1 tablet (20 mg total) by mouth daily. (Patient taking differently: Take 20 mg by mouth daily as needed (for fluid retention.). ) 90 tablet 3  . loratadine (CLARITIN) 10 MG tablet Take 20 mg by mouth daily.     . naproxen (NAPROSYN) 500 MG tablet Take 1 tablet (500 mg total) by mouth 2 (two) times daily with a meal. (Patient taking differently: Take 500 mg by mouth 2 (two) times daily as needed (for pain.). ) 30 tablet 0  . nystatin (MYCOSTATIN) 100000 UNIT/ML suspension Take 5 mLs (500,000 Units total) by mouth 4 (four) times daily. 473 mL 0  . palbociclib (IBRANCE) 75 MG capsule TAKE 1 CAPSULE (75MG) BY MOUTH DAILY. TAKE WHOLE WITH FOOD. 21 capsule 2  . pantoprazole (PROTONIX) 40 MG tablet Take 1 tablet (40 mg total) by mouth daily. Take 30-60 min before first meal of the day 30 tablet 2  . promethazine (PHENERGAN) 25 MG tablet Take 1 tablet (25 mg total) by mouth every 6 (six) hours as needed for nausea or vomiting. 30 tablet 0  . promethazine-dextromethorphan (PROMETHAZINE-DM) 6.25-15 MG/5ML syrup Take 5 mLs by mouth 4 (four) times daily as needed. 118 mL 0  . ranitidine (ZANTAC) 300 MG tablet Take 1 tablet (300 mg total) by mouth at bedtime. (Patient taking differently: Take 300 mg by mouth at bedtime as needed (for indigestion.). ) 30 tablet 5  . tobramycin (TOBREX) 0.3 % ophthalmic solution Place 2 drops into both eyes every 4 (four) hours. 5 mL 0  . traMADol (ULTRAM) 50 MG tablet Take 1-2 tablets (50-100 mg total)  by mouth every 6 (six) hours as needed for moderate pain or severe pain. 30 tablet 0  . benzonatate (TESSALON) 100 MG capsule Take 1 capsule (100 mg total) by mouth every 8 (eight) hours. 21 capsule 0   No current facility-administered  medications for this visit.    Facility-Administered Medications Ordered in Other Visits  Medication Dose Route Frequency Provider Last Rate Last Dose  . denosumab (XGEVA) injection 120 mg  120 mg Subcutaneous Once Magrinat, Virgie Dad, MD        PHYSICAL EXAMINATION:  ECOG PERFORMANCE STATUS: 2-3  Vitals:   09/05/17 1051  BP: 123/71  Pulse: 72  Resp: 18  Temp: 98.6 F (37 C)  SpO2: 98%   There were no vitals filed for this visit.  GENERAL:alert, no distress and comfortable SKIN: skin color, texture, turgor are normal, no rashes or significant lesions EYES: normal, Conjunctiva are pink and non-injected, sclera clear OROPHARYNX:no exudate, no erythema and lips, buccal mucosa, and tongue normal  NECK: supple, thyroid normal size, non-tender, (+)palpable right thyroid nodule  LYMPH:  no palpable lymphadenopathy in the cervical, axillary or inguinal LUNGS: clear to auscultation and percussion with normal breathing effort HEART: regular rate & rhythm and no murmurs and no lower extremity edema ABDOMEN:abdomen soft, non-tender and normal bowel sounds (+) S/p Appendectomy: 3 laparoscopic surgical incision across lower abdomen are healing well.    Musculoskeletal:no cyanosis of digits and no clubbing  NEURO: alert & oriented x 3 with fluent speech, no focal motor/sensory deficits Breast: (+) S/p left mastectomy and recent implant removal, surgical incision is healing well, no discharge or skin erythema, drainage tube is still in place. no palpable mass or adenopathy. right breast exam negative    LABORATORY DATA:  I have reviewed the data as listed CBC Latest Ref Rng & Units 09/05/2017 08/27/2017 08/13/2017  WBC 3.9 - 10.3 K/uL 4.5 7.0 6.9  Hemoglobin 11.6 - 15.9 g/dL 11.3(L) 11.1(L) 11.7(L)  Hematocrit 34.8 - 46.6 % 34.9 34.7(L) 35.8(L)  Platelets 145 - 400 K/uL 250 260 273     CMP Latest Ref Rng & Units 09/05/2017 08/27/2017 08/13/2017  Glucose 70 - 140 mg/dL 118 129(H) 115(H)  BUN 7 -  26 mg/dL 14 12 9   Creatinine 0.60 - 1.10 mg/dL 0.77 0.87 0.71  Sodium 136 - 145 mmol/L 139 138 141  Potassium 3.5 - 5.1 mmol/L 4.3 4.0 3.8  Chloride 98 - 109 mmol/L 106 102 108  CO2 22 - 29 mmol/L 25 27 23   Calcium 8.4 - 10.4 mg/dL 9.3 8.9 9.1  Total Protein 6.4 - 8.3 g/dL 7.6 7.5 7.7  Total Bilirubin 0.2 - 1.2 mg/dL 0.8 1.4(H) 1.0  Alkaline Phos 40 - 150 U/L 147 148(H) 101  AST 5 - 34 U/L 25 62(H) 17  ALT 0 - 55 U/L 14 43 13(L)    PATHOLOGY   Diagnosis 08/22/17  1. Appendix, Other than Incidental - METASTATIC CARCINOMA, CONSISTENT WITH BREAST PRIMARY. - CARCINOMA IS PRESENT AT THE SURGICAL RESECTION MARGIN AS WELL AS THE SEROSAL SURFACE. - LYMPHOVASCULAR INVASION IS IDENTIFIED. - SEE COMMENT. 2. Breast, capsule, Left - DENSE FIBROUS TISSUE WITH MILD INFLAMMATION, INCLUDING A FEW SCATTERED MULTINUCLEATED GIANT CELLS. - THERE IS NO EVIDENCE OF MALIGNANCY. Microscopic Comment 1. Grossly, the appendiceal wall is thickened. Histologic evaluation reveals diffuse involvement by a malignant process, consisting of small relatively uniform discohesive tumor cells involving the submucosa as well as the periappendiceal soft tissue, including the serosal surface. Immunohistochemical stains were performed revealing  that the tumor cells are positive for cytokeratin 7, GATA-3, estrogen receptor and focally positive for GCDFP. Tumor cells are negative for CD56, cytokeratin 20, chromogranin and synaptophysin. A Ki-67 stain is positive in approximately 70% of the tumor cells. Overall, the findings are consistent with metastatic carcinoma of breast primary and may be a lobular phenotype. A complete breast prognostic profile will be performed and the results reported separately. Dr. Vicente Males has reviewed selected slides and concurs with this interpretation. (JBK:kh 08-25-17)    PROCEDURES  ECHO 01/22/17 Study Conclusions - Left ventricle: The cavity size was normal. There was moderate  concentric hypertrophy. Systolic function was normal. The estimated ejection fraction was in the range of 55% to 60%. Wall motion was normal; there were no regional wall motion abnormalities. Doppler parameters are consistent with abnormal left ventricular relaxation (grade 1 diastolic dysfunction).   RADIOGRAPHIC STUDIES: I have personally reviewed the radiological images as listed and agreed with the findings in the report. No results found.    CT AP W Contrast 08/27/17 IMPRESSION: No evidence of pulmonary embolus. Small pericardial effusion with uncertain clinical significance. No evidence of acute abnormalities within the chest, abdomen or pelvis. Area of fat stranding and small focus of gas within the anterior abdominal wall, immediately inferior to the umbilicus, probably at the site of laparoscopic access.   Whole Body Scan 08/12/17 IMPRESSION: Scattered degenerative type uptake as above with a questionable focus of increased tracer localization at L2 vertebral body versus artifact; no abnormality is seen at this site by CT. Consider either characterization by MR or attention on follow-up imaging.   CT AP W Contrast 08/12/17  IMPRESSION: Continued thickening of the appendix is noted without surrounding inflammation. It has maximum measured diameter is decreased compared to prior exam. There is no evidence of acute inflammation. Stable mixed lytic and sclerotic appearance of visualized skeleton is noted consistent with history of known osseous metastases. No acute abnormality seen in the abdomen or pelvis.   CT AP W Contrast 02/07/17 IMPRESSION: 1. No CT findings for abdominal/pelvic metastatic disease. 2. No acute abdominal findings, mass lesions or adenopathy. 3. Status post cholecystectomy with mild associated common bile duct dilatation. 4. Somewhat thickened appendix appears relatively stable. No acute inflammatory process. 5. Stable mixed lytic and sclerotic  osseous metastatic disease.   Bone Scan Whole Body 02/07/17 IMPRESSION: No scintigraphic evidence skeletal metastasis    ASSESSMENT & PLAN:  Michaela Little is a 57 y.o. female with a history of HTN, HLD, Arthritis, Asthma, Sleep Apnea, and H. Pylori.   1. 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 -She has metastatic ER positive breast cancer to bones, has been on letrozole and Ibrance since March 2017. She has had her oncological care both in [place] and [place], she had a PET scan in December 2017 and  CT scan in April 2018. Due to her worsening bilateral rib cage pain, I obtained restaging CT abdomen and pelvis with contrast, and bone scan on 02/07/2017. I previously reviewed the scan findings with pt, which showed stable lipomatosis, not hypermetabolic on bone scan, no other new lesions.  No evidence of disease progression on the restaging scan -Given her increase b/l rib cage pain, possible related to her letrozole along with her bone mets, I switched her to Exemestane (02/13/17) and hr bone pain improved.  -She had already held her treatment in  preparation of surgery before surgery was post poned. She was reluctant to restart her medications because she feels much better off them, her right-sided rib cage pain has improved. I discussed that these medications are the best chance to control her disease and prolong her life. She agrees to restart exemestane and Ibrance.  -She underwent appendectomy by Dr. Ninfa Linden and breast reconstruction to remove her breast implant by Dr. Harlow Mares on 08/22/17. She held treatment 7-14 days before surgery and restarted 1 week after surgery.  -The pathology from her surgery showed evidence of metastatic breast cancer in her appendix, ER positive/PR and HER2 negative marker were present, she had positive margins remaining. Given she  has metastatic cancer further surgery is not beneficial. I recommend she continue current regimen. She can restart Ibrance once she recovers from surgery in 2 weeks. She is agreeable.  -I discussed her goal of therapy is palliative and she will continue on this for as long as she can tolerate of her disease progresses.  -We reviewed and discussed her CT AP and bone scan from 08/12/17 which shows no PE, but shows mild pericardial effusion. No current need for thoracentesis, will monitor. Bone scan shows Scattered degenerative type uptake at L2 vertebral body versus artifact; no abnormality is seen at this site by CT.Marland Kitchen  Pericardial effusion is new from previous scan, I will obtain an echocardiogram for further evaluation. -Labs reviewed, Hg at 11.3 otherwise CBC and CMP WNL.  -Continue Exemestane  -Continue Xgeva today and monthly  -F/u in 6 weeks    2. Primary breast cancer of left breast overlapping sites diagnosed March of 2010 and treated neo-adjuvantly at [facility] with (a) paclitaxel weekly x12 and (b) doxorubicin/ cyclophosphamide in dose dense fashion x4, both given with tifiparnib.  -definitive left modified radical mastectomy September of 2010 for a T2 N2 or stage IIIA invasive lobular breast cancer, estrogen and progesterone receptor positive, HER-2 negative,  -post mastectomy radiation completed January of 2011, -briefly on tamoxifen, discontinued it because of uterine lining concerns.  On anastrozole since May 2012 with good tolerance. Held between November and December 2013 due to GI issues, unrelated to cancer or its treatment. Discontinued by the patient summer of 2016  3. Bilateral rib cage pain, Left Breast pain  -She takes Tylenol and tramadol as needed.  -Bone pain has increased to bilateral ribs. I have switched her letrozole to Exemestane. Her pain has improved. -She previously declined narcotics, but I suggested she continue to take tylenol, Advil or aleve for her pain as we  want to help her control her pain.  -I refilled her Tramadol today (09/05/17), pain related to her breast surgery now.  -I recommend physical therapy to help while she recovers, she is interested. I will send PT referral.   4. Lower Extremity Edema -She is on Lasix, given by her PCP Dr. Altamease Oiler -I previously suggested compression socks and advices her to elevate her feet when sitting at home.  -Repeat echocardiogram in 12/2016 shows normal EF  5. Dyspnea, Cough -Uses Flovent -Ordered ECHO previously to rule out cardiomyopathy, she had chemo Adriamycin before which can cause heart failure  -01/22/17 ECHO showed normal EF 55-60%, and grade 1 diastolic dysfunction -Her CT AP from 08/27/17 shows mild pericardial effusion, will monitor closely.  -I will order ECHO in 1 month for follow up. She is agreeable.  -I recommend Musinex for her cough.   6. Insomnia  -She is on hydroxyzine already  -I suggested her to try melatonin  previously -She was given gabapentin in the past, I previously advised if it is not working for her she can stop.   7. Left breast implant rupture, resolved with surgery -She underwent breast reconstruction on 08/22/17 and had implant completely removed by Dr. Marica Otter   8. 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 Ninfa Linden on 08/22/17 which was found to be metastasis of her breast cancer.   9. Goal of care discussion  -We discussed the incurable nature of her cancer, and the overall poor prognosis, especially if she does not have good response to chemotherapy or progress on chemo -The patient understands the goal of care is palliative. -she is full code now -She advised me that she is in the process of getting a home health aid. I previously recommended that she also look into getting home physical therapy too.    PLAN:    -Labs and scan reviewed  -I refilled her Tramadol today   -Continue Exemestane daily -Lab f/u and injection in 6 weeks  -Echo before next visit  -Patient was scheduled for Xgeva injection today, but left without injection.  Will restart on her next visit.   Orders Placed This Encounter  Procedures  . ECHOCARDIOGRAM COMPLETE    Evaluate pericardiac effusion    Standing Status:   Future    Standing Expiration Date:   12/07/2018    Order Specific Question:   Where should this test be performed    Answer:   [facility]    Order Specific Question:   Perflutren DEFINITY (image enhancing agent) should be administered unless hypersensitivity or allergy exist    Answer:   Administer Perflutren    Order Specific Question:   Expected Date:    Answer:   1 month   All questions were answered. The patient knows to call the clinic with any problems, questions or concerns. No barriers to learning was detected.  I spent 25 minutes counseling the patient face to face. The total time spent in the appointment was 30 minutes and more than 50% was on counseling and review of test results  I, Joslyn Devon, am acting as scribe for [name], MD.   I have reviewed the above documentation for accuracy and completeness, and I agree with the above.     [name], MD 09/05/2017 12:58 PM

## 2017-09-05 ENCOUNTER — Encounter: Payer: Self-pay | Admitting: Hematology

## 2017-09-05 ENCOUNTER — Telehealth: Payer: Self-pay

## 2017-09-05 ENCOUNTER — Inpatient Hospital Stay (HOSPITAL_BASED_OUTPATIENT_CLINIC_OR_DEPARTMENT_OTHER): Payer: Medicare Other | Admitting: Hematology

## 2017-09-05 ENCOUNTER — Inpatient Hospital Stay: Payer: Medicare Other

## 2017-09-05 ENCOUNTER — Inpatient Hospital Stay: Payer: Medicare Other | Attending: Hematology

## 2017-09-05 VITALS — BP 123/71 | HR 72 | Temp 98.6°F | Resp 18 | Ht 67.0 in

## 2017-09-05 DIAGNOSIS — Z170 Estrogen receptor positive status [ER+]: Secondary | ICD-10-CM | POA: Insufficient documentation

## 2017-09-05 DIAGNOSIS — R0609 Other forms of dyspnea: Secondary | ICD-10-CM | POA: Diagnosis not present

## 2017-09-05 DIAGNOSIS — C50912 Malignant neoplasm of unspecified site of left female breast: Secondary | ICD-10-CM

## 2017-09-05 DIAGNOSIS — C7951 Secondary malignant neoplasm of bone: Secondary | ICD-10-CM | POA: Insufficient documentation

## 2017-09-05 DIAGNOSIS — Z7189 Other specified counseling: Secondary | ICD-10-CM | POA: Insufficient documentation

## 2017-09-05 DIAGNOSIS — C50812 Malignant neoplasm of overlapping sites of left female breast: Secondary | ICD-10-CM | POA: Insufficient documentation

## 2017-09-05 DIAGNOSIS — C785 Secondary malignant neoplasm of large intestine and rectum: Secondary | ICD-10-CM | POA: Diagnosis not present

## 2017-09-05 DIAGNOSIS — Z79811 Long term (current) use of aromatase inhibitors: Secondary | ICD-10-CM | POA: Insufficient documentation

## 2017-09-05 DIAGNOSIS — R05 Cough: Secondary | ICD-10-CM | POA: Diagnosis not present

## 2017-09-05 DIAGNOSIS — R0781 Pleurodynia: Secondary | ICD-10-CM

## 2017-09-05 DIAGNOSIS — I313 Pericardial effusion (noninflammatory): Secondary | ICD-10-CM | POA: Diagnosis not present

## 2017-09-05 LAB — COMPREHENSIVE METABOLIC PANEL
ALT: 14 U/L (ref 0–55)
ANION GAP: 8 (ref 3–11)
AST: 25 U/L (ref 5–34)
Albumin: 3.5 g/dL (ref 3.5–5.0)
Alkaline Phosphatase: 147 U/L (ref 40–150)
BUN: 14 mg/dL (ref 7–26)
CHLORIDE: 106 mmol/L (ref 98–109)
CO2: 25 mmol/L (ref 22–29)
Calcium: 9.3 mg/dL (ref 8.4–10.4)
Creatinine, Ser: 0.77 mg/dL (ref 0.60–1.10)
GFR calc non Af Amer: >60 mL/min (ref >60)
Glucose, Bld: 118 mg/dL (ref 70–140)
Potassium: 4.3 mmol/L (ref 3.5–5.1)
SODIUM: 139 mmol/L (ref 136–145)
Total Bilirubin: 0.8 mg/dL (ref 0.2–1.2)
Total Protein: 7.6 g/dL (ref 6.4–8.3)

## 2017-09-05 LAB — CBC WITH DIFFERENTIAL/PLATELET
BASOS PCT: 0 %
Basophils Absolute: 0 10*3/uL (ref 0.0–0.1)
Eosinophils Absolute: 0.1 10*3/uL (ref 0.0–0.5)
Eosinophils Relative: 2 %
HEMATOCRIT: 34.9 % (ref 34.8–46.6)
HEMOGLOBIN: 11.3 g/dL — AB (ref 11.6–15.9)
LYMPHS ABS: 1.2 10*3/uL (ref 0.9–3.3)
Lymphocytes Relative: 25 %
MCH: 28 pg (ref 25.1–34.0)
MCHC: 32.4 g/dL (ref 31.5–36.0)
MCV: 86.4 fL (ref 79.5–101.0)
Monocytes Absolute: 0.4 10*3/uL (ref 0.1–0.9)
Monocytes Relative: 8 %
NEUTROS ABS: 2.9 10*3/uL (ref 1.5–6.5)
NEUTROS PCT: 65 %
Platelets: 250 10*3/uL (ref 145–400)
RBC: 4.04 MIL/uL (ref 3.70–5.45)
RDW: 14.3 % (ref 11.2–14.5)
WBC: 4.5 10*3/uL (ref 3.9–10.3)

## 2017-09-05 MED ORDER — TRAMADOL HCL 50 MG PO TABS: 50.0000 mg | ORAL_TABLET | Freq: Four times a day (QID) | ORAL | 0 refills | Status: DC | PRN

## 2017-09-05 MED ORDER — DENOSUMAB 120 MG/1.7ML ~~LOC~~ SOLN
SUBCUTANEOUS | Status: AC
Start: 2017-09-05 — End: ?
  Filled 2017-09-05: qty 1.7

## 2017-09-05 MED ORDER — DENOSUMAB 120 MG/1.7ML ~~LOC~~ SOLN: 120.0000 mg | Freq: Once | SUBCUTANEOUS | Status: DC

## 2017-09-05 NOTE — Progress Notes (Signed)
Talked to DR. Feng's desk nurse, no xgeva shot today gave copy of labs to patient

## 2017-09-05 NOTE — Telephone Encounter (Signed)
Printed avs and calender of upcoming appointment. Per 6/13 los 

## 2017-09-06 LAB — CANCER ANTIGEN 27.29: CAN 27.29: 108.5 U/mL — AB (ref 0.0–38.6)

## 2017-09-09 ENCOUNTER — Telehealth: Payer: Self-pay | Admitting: Hematology

## 2017-09-09 NOTE — Telephone Encounter (Signed)
Appointment added to patients scheduled calendar and letter also mailed per 6/13 sch msg

## 2017-09-18 ENCOUNTER — Other Ambulatory Visit (INDEPENDENT_AMBULATORY_CARE_PROVIDER_SITE_OTHER): Payer: Self-pay | Admitting: Physician Assistant

## 2017-09-18 DIAGNOSIS — I10 Essential (primary) hypertension: Secondary | ICD-10-CM

## 2017-09-19 NOTE — Telephone Encounter (Signed)
FWD to PCP. Tempestt S Roberts, CMA  

## 2017-09-23 ENCOUNTER — Other Ambulatory Visit: Payer: Self-pay | Admitting: Hematology

## 2017-09-23 DIAGNOSIS — Z170 Estrogen receptor positive status [ER+]: Principal | ICD-10-CM

## 2017-09-23 DIAGNOSIS — C50812 Malignant neoplasm of overlapping sites of left female breast: Secondary | ICD-10-CM

## 2017-09-24 ENCOUNTER — Telehealth (INDEPENDENT_AMBULATORY_CARE_PROVIDER_SITE_OTHER): Payer: Self-pay | Admitting: Physician Assistant

## 2017-09-24 ENCOUNTER — Other Ambulatory Visit (INDEPENDENT_AMBULATORY_CARE_PROVIDER_SITE_OTHER): Payer: Self-pay | Admitting: Physician Assistant

## 2017-09-24 DIAGNOSIS — I10 Essential (primary) hypertension: Secondary | ICD-10-CM

## 2017-09-24 MED ORDER — AMLODIPINE BESYLATE 10 MG PO TABS: 10.0000 mg | ORAL_TABLET | Freq: Every day | ORAL | 3 refills | Status: DC

## 2017-09-24 NOTE — Telephone Encounter (Signed)
Patient called requesting medication refill for amLODipine (NORVASC) 10 MG tablet  Patient uses pharmacy [facility] on [address], [place], [place] [address]  Please Follow up [phone]  Thank you

## 2017-09-25 ENCOUNTER — Ambulatory Visit (INDEPENDENT_AMBULATORY_CARE_PROVIDER_SITE_OTHER): Payer: Medicare Other | Admitting: Physician Assistant

## 2017-09-25 ENCOUNTER — Other Ambulatory Visit: Payer: Self-pay

## 2017-09-25 ENCOUNTER — Encounter (INDEPENDENT_AMBULATORY_CARE_PROVIDER_SITE_OTHER): Payer: Self-pay | Admitting: Physician Assistant

## 2017-09-25 VITALS — BP 106/71 | HR 70 | Temp 98.7°F | Ht 67.0 in | Wt 266.4 lb

## 2017-09-25 DIAGNOSIS — Z79899 Other long term (current) drug therapy: Secondary | ICD-10-CM | POA: Insufficient documentation

## 2017-09-25 DIAGNOSIS — Z760 Encounter for issue of repeat prescription: Secondary | ICD-10-CM | POA: Diagnosis not present

## 2017-09-25 DIAGNOSIS — R05 Cough: Secondary | ICD-10-CM | POA: Diagnosis not present

## 2017-09-25 DIAGNOSIS — R059 Cough, unspecified: Secondary | ICD-10-CM

## 2017-09-25 DIAGNOSIS — R600 Localized edema: Secondary | ICD-10-CM | POA: Insufficient documentation

## 2017-09-25 DIAGNOSIS — R509 Fever, unspecified: Secondary | ICD-10-CM

## 2017-09-25 MED ORDER — FUROSEMIDE 20 MG PO TABS: 20.0000 mg | ORAL_TABLET | Freq: Every day | ORAL | 5 refills | Status: DC | PRN

## 2017-09-25 MED ORDER — IPRATROPIUM-ALBUTEROL 0.5-2.5 (3) MG/3ML IN SOLN: 3.0000 mL | Freq: Four times a day (QID) | RESPIRATORY_TRACT | 5 refills | Status: DC | PRN

## 2017-09-25 MED ORDER — BENZONATATE 100 MG PO CAPS: 100.0000 mg | ORAL_CAPSULE | Freq: Two times a day (BID) | ORAL | 0 refills | Status: DC | PRN

## 2017-09-25 MED ORDER — LEVOFLOXACIN 500 MG PO TABS: 500.0000 mg | ORAL_TABLET | Freq: Every day | ORAL | 0 refills | Status: DC

## 2017-09-25 NOTE — Patient Instructions (Signed)

## 2017-09-25 NOTE — Addendum Note (Signed)
Addended by: Domenica Fail D on: 09/25/2017 03:18 PM   Modules accepted: Orders

## 2017-09-25 NOTE — Progress Notes (Signed)
Subjective:  Patient ID: Michaela Little, female    DOB: 11-10-1960  Age: 57 y.o. MRN: [id number]  CC: f/u health maintenance  HPI Michaela J Scottis a 57 y.o.femalewith a PMH of Anxiety, Breast CA, HTN, OSA, Asthma, cholecystectomy, and dyspnea presents to f/u on health maintenance. Has been going to oncology for breast cancer treatment. Found to have metastasis to the appendix and had appendectomy on 08/22/17. Also had removal of left breast tissue expander on 08/22/17.    Pt complains of chronic cough. Oncologist will order another echocardiogram given that patient had taken Adriamcyin which can cause heart failure. Previous echocardiogram on 01/22/17 showed normal EF 55-60%, and grade 1 diastolic dysfunction. Her CT AP from 08/27/17 shows mild pericardial effusion. Pt has taken Mucinex for cough with little to no relief of cough. Has been having fever of approximately 103 F. Endorses fatigue. Does not endorse any other symptoms or complaints.     Outpatient Medications Prior to Visit  Medication Sig Dispense Refill  . albuterol (PROVENTIL HFA;VENTOLIN HFA) 108 (90 Base) MCG/ACT inhaler Inhale 2 puffs into the lungs every 6 (six) hours as needed for wheezing or shortness of breath.    Marland Kitchen amLODipine (NORVASC) 10 MG tablet Take 1 tablet (10 mg total) by mouth daily. 90 tablet 3  . azithromycin (ZITHROMAX Z-PAK) 250 MG tablet Take 2 po the first day then once a day for the next 4 days. 6 tablet 0  . benzonatate (TESSALON) 100 MG capsule Take 1 capsule (100 mg total) by mouth every 8 (eight) hours. 21 capsule 0  . carvedilol (COREG) 6.25 MG tablet Take 1 tablet (6.25 mg total) by mouth 2 (two) times daily with a meal. (Patient taking differently: Take 12.5 mg by mouth daily. ) 180 tablet 3  . exemestane (AROMASIN) 25 MG tablet Take 1 tablet (25 mg total) by mouth daily after breakfast. 90 tablet 1  . famotidine (PEPCID) 20 MG tablet Take 1 TABLET at bedtime (Patient taking differently: Take 20 mg by  mouth at bedtime. ) 30 tablet 2  . fluticasone (FLOVENT HFA) 110 MCG/ACT inhaler Inhale 2 puffs into the lungs 2 (two) times daily. With spacer (Patient taking differently: Inhale 2 puffs into the lungs 2 (two) times daily as needed (for respiratory issues.). With spacer) 1 Inhaler 5  . furosemide (LASIX) 20 MG tablet Take 1 tablet (20 mg total) by mouth daily as needed (for fluid retention.). 30 tablet 5  . loratadine (CLARITIN) 10 MG tablet Take 20 mg by mouth daily.     . naproxen (NAPROSYN) 500 MG tablet Take 1 tablet (500 mg total) by mouth 2 (two) times daily with a meal. (Patient taking differently: Take 500 mg by mouth 2 (two) times daily as needed (for pain.). ) 30 tablet 0  . nystatin (MYCOSTATIN) 100000 UNIT/ML suspension Take 5 mLs (500,000 Units total) by mouth 4 (four) times daily. 473 mL 0  . palbociclib (IBRANCE) 75 MG capsule TAKE 1 CAPSULE (75MG ) BY MOUTH DAILY. TAKE WHOLE WITH FOOD. 21 capsule 2  . pantoprazole (PROTONIX) 40 MG tablet Take 1 tablet (40 mg total) by mouth daily. Take 30-60 min before first meal of the day 30 tablet 2  . promethazine (PHENERGAN) 25 MG tablet Take 1 tablet (25 mg total) by mouth every 6 (six) hours as needed for nausea or vomiting. 30 tablet 0  . promethazine-dextromethorphan (PROMETHAZINE-DM) 6.25-15 MG/5ML syrup Take 5 mLs by mouth 4 (four) times daily as needed. 118 mL 0  .  ranitidine (ZANTAC) 300 MG tablet Take 1 tablet (300 mg total) by mouth at bedtime. (Patient taking differently: Take 300 mg by mouth at bedtime as needed (for indigestion.). ) 30 tablet 5  . tobramycin (TOBREX) 0.3 % ophthalmic solution Place 2 drops into both eyes every 4 (four) hours. 5 mL 0  . traMADol (ULTRAM) 50 MG tablet Take 1-2 tablets (50-100 mg total) by mouth every 6 (six) hours as needed for moderate pain or severe pain. 30 tablet 0   No facility-administered medications prior to visit.      ROS Review of Systems  Constitutional: Positive for fever and  malaise/fatigue. Negative for chills.  Eyes: Negative for blurred vision.  Respiratory: Positive for cough. Negative for shortness of breath.   Cardiovascular: Negative for chest pain and palpitations.  Gastrointestinal: Negative for abdominal pain and nausea.  Genitourinary: Negative for dysuria and hematuria.  Musculoskeletal: Negative for joint pain and myalgias.  Skin: Negative for rash.  Neurological: Negative for tingling and headaches.  Psychiatric/Behavioral: Negative for depression. The patient is not nervous/anxious.     Objective:  LMP 02/25/2009   BP/Weight 09/05/2017 10/24/1912 10/01/2954  Systolic BP 213 086 -  Diastolic BP 71 59 -  Wt. (Lbs) - - 281  BMI 44.01 - 44.01      Physical Exam  Constitutional: She is oriented to person, place, and time.  Well developed, obese, NAD, polite, eating [employer] chips with sodium content of 200 mg per serving.  HENT:  Head: Normocephalic and atraumatic.  Eyes: No scleral icterus.  Neck: Normal range of motion. Neck supple. No thyromegaly present.  Cardiovascular: Normal rate, regular rhythm and normal heart sounds. Exam reveals no friction rub.  No murmur heard. 2+ pitting edema on RLE and 1+ pitting edema on LLE   Pulmonary/Chest: Effort normal and breath sounds normal. No stridor. No respiratory distress. She has no wheezes. She has no rales.  Musculoskeletal: She exhibits no edema.  Neurological: She is alert and oriented to person, place, and time.  Skin: Skin is warm and dry. No rash noted. No erythema. No pallor.  Psychiatric: She has a normal mood and affect. Her behavior is normal. Thought content normal.  Vitals reviewed.    Assessment & Plan:    1. Cough - CBC with Differential - Brain natriuretic peptide - Ambulatory referral to Pulmonology - Begin levofloxacin (LEVAQUIN) 500 MG tablet; Take 1 tablet (500 mg total) by mouth daily.  Dispense: 7 tablet; Refill: 0 - Begin benzonatate (TESSALON) 100 MG capsule;  Take 1 capsule (100 mg total) by mouth 2 (two) times daily as needed for cough.  Dispense: 30 capsule; Refill: 0  2. Fever, unspecified - Begin levofloxacin (LEVAQUIN) 500 MG tablet; Take 1 tablet (500 mg total) by mouth daily.  Dispense: 7 tablet; Refill: 0  3. High risk medication use - CBC with Differential - Brain natriuretic peptide  4. Lower extremity edema - Brain natriuretic peptide - Refill furosemide (LASIX) 20 MG tablet; Take 1 tablet (20 mg total) by mouth daily as needed (for fluid retention.).  Dispense: 30 tablet; Refill: 5   Meds ordered this encounter  Medications  . levofloxacin (LEVAQUIN) 500 MG tablet    Sig: Take 1 tablet (500 mg total) by mouth daily.    Dispense:  7 tablet    Refill:  0    Order Specific Question:   Supervising Provider    Answer:   Michaela Little [[id number]]  . furosemide (LASIX) 20 MG tablet  Sig: Take 1 tablet (20 mg total) by mouth daily as needed (for fluid retention.).    Dispense:  30 tablet    Refill:  5    This prescription was filled on 09/18/2017. Any refills authorized will be placed on file.    Order Specific Question:   Supervising Provider    Answer:   Michaela Little [[id number]]  . benzonatate (TESSALON) 100 MG capsule    Sig: Take 1 capsule (100 mg total) by mouth 2 (two) times daily as needed for cough.    Dispense:  30 capsule    Refill:  0    Order Specific Question:   Supervising Provider    Answer:   Michaela Little [[id number]]    Follow-up: Return in about 1 month (around 10/23/2017) for f/u cough.   Clent Demark PA

## 2017-09-26 LAB — CBC WITH DIFFERENTIAL/PLATELET
Basophils Absolute: 0 10*3/uL (ref 0.0–0.2)
Basos: 1 %
EOS (ABSOLUTE): 0.1 10*3/uL (ref 0.0–0.4)
EOS: 2 %
HEMATOCRIT: 32.3 % — AB (ref 34.0–46.6)
Hemoglobin: 10.3 g/dL — ABNORMAL LOW (ref 11.1–15.9)
IMMATURE GRANULOCYTES: 3 %
Immature Grans (Abs): 0.1 10*3/uL (ref 0.0–0.1)
LYMPHS ABS: 1.2 10*3/uL (ref 0.7–3.1)
Lymphs: 33 %
MCH: 26.7 pg (ref 26.6–33.0)
MCHC: 31.9 g/dL (ref 31.5–35.7)
MCV: 84 fL (ref 79–97)
MONOS ABS: 0.1 10*3/uL (ref 0.1–0.9)
Monocytes: 4 %
NEUTROS PCT: 57 %
NRBC: 1 % — ABNORMAL HIGH (ref 0–0)
Neutrophils Absolute: 2.2 10*3/uL (ref 1.4–7.0)
Platelets: 271 10*3/uL (ref 150–450)
RBC: 3.86 x10E6/uL (ref 3.77–5.28)
RDW: 14.4 % (ref 12.3–15.4)
WBC: 3.7 10*3/uL (ref 3.4–10.8)

## 2017-09-26 LAB — BRAIN NATRIURETIC PEPTIDE: BNP: 20.4 pg/mL (ref 0.0–100.0)

## 2017-09-27 ENCOUNTER — Telehealth (INDEPENDENT_AMBULATORY_CARE_PROVIDER_SITE_OTHER): Payer: Self-pay

## 2017-09-27 NOTE — Telephone Encounter (Signed)
-----   Message from Clent Demark, PA-C sent at 09/27/2017  8:40 AM EDT ----- Mildly anemic likely due to her chemotherapy. Take multivitamin with iron. There is no significant sign of acute heart failure causing her lower extremity edema. Has to control blood pressure as best as possible. Take the Lasix PRN as I directed her.

## 2017-09-27 NOTE — Telephone Encounter (Signed)
Patient is aware that she is mildly anemic due to her chemotherapy. Advised to take OTC multivitamin with iron. Informed that there is no significant sign of acute heart failure causing her LE edema. Advised to control blood as best she can and take lasix PRN as directed. Patient expressed understanding. Nat Christen, CMA

## 2017-10-04 ENCOUNTER — Telehealth (INDEPENDENT_AMBULATORY_CARE_PROVIDER_SITE_OTHER): Payer: Self-pay

## 2017-10-04 NOTE — Telephone Encounter (Signed)
Patient contacted insurance provider to inform that nebulizer machine is broken. Insurance called office to ask for script for neb to be sent to [facility]. Nat Christen, [place] office number [phone]

## 2017-10-07 ENCOUNTER — Other Ambulatory Visit (INDEPENDENT_AMBULATORY_CARE_PROVIDER_SITE_OTHER): Payer: Self-pay | Admitting: Physician Assistant

## 2017-10-07 ENCOUNTER — Ambulatory Visit (HOSPITAL_COMMUNITY)
Admission: RE | Admit: 2017-10-07 | Discharge: 2017-10-07 | Disposition: A | Discharge status: Home / Self Care | Payer: Medicare Other | Source: Ambulatory Visit | Attending: Hematology | Admitting: Hematology

## 2017-10-07 DIAGNOSIS — I110 Hypertensive heart disease with heart failure: Secondary | ICD-10-CM | POA: Insufficient documentation

## 2017-10-07 DIAGNOSIS — I371 Nonrheumatic pulmonary valve insufficiency: Secondary | ICD-10-CM | POA: Insufficient documentation

## 2017-10-07 DIAGNOSIS — Z170 Estrogen receptor positive status [ER+]: Secondary | ICD-10-CM

## 2017-10-07 DIAGNOSIS — J45909 Unspecified asthma, uncomplicated: Secondary | ICD-10-CM

## 2017-10-07 DIAGNOSIS — C50812 Malignant neoplasm of overlapping sites of left female breast: Secondary | ICD-10-CM

## 2017-10-07 DIAGNOSIS — E785 Hyperlipidemia, unspecified: Secondary | ICD-10-CM | POA: Insufficient documentation

## 2017-10-07 DIAGNOSIS — I509 Heart failure, unspecified: Secondary | ICD-10-CM | POA: Diagnosis not present

## 2017-10-07 NOTE — Progress Notes (Signed)
  Echocardiogram 2D Echocardiogram has been performed.  Michaela Little 10/07/2017, 10:27 AM

## 2017-10-07 NOTE — Telephone Encounter (Signed)
I have printed DME order for nebulizer. Please fax to [facility].

## 2017-10-07 NOTE — Telephone Encounter (Signed)
Order Faxed. Nat Christen, CMA

## 2017-10-10 ENCOUNTER — Ambulatory Visit (INDEPENDENT_AMBULATORY_CARE_PROVIDER_SITE_OTHER): Payer: Medicare Other | Admitting: Physician Assistant

## 2017-10-10 ENCOUNTER — Other Ambulatory Visit: Payer: Self-pay

## 2017-10-10 ENCOUNTER — Encounter (INDEPENDENT_AMBULATORY_CARE_PROVIDER_SITE_OTHER): Payer: Self-pay | Admitting: Physician Assistant

## 2017-10-10 VITALS — BP 129/80 | HR 67 | Temp 98.0°F | Ht 67.0 in | Wt 271.0 lb

## 2017-10-10 DIAGNOSIS — J4530 Mild persistent asthma, uncomplicated: Secondary | ICD-10-CM

## 2017-10-10 DIAGNOSIS — Z029 Encounter for administrative examinations, unspecified: Secondary | ICD-10-CM | POA: Diagnosis not present

## 2017-10-10 MED ORDER — MONTELUKAST SODIUM 10 MG PO TABS: 10.0000 mg | ORAL_TABLET | Freq: Every day | ORAL | 5 refills | Status: DC

## 2017-10-10 MED ORDER — FLUTICASONE-SALMETEROL 250-50 MCG/DOSE IN AEPB: 1.0000 | INHALATION_SPRAY | Freq: Two times a day (BID) | RESPIRATORY_TRACT | 5 refills | Status: DC

## 2017-10-10 NOTE — Patient Instructions (Signed)
Asthma Attack Prevention, Adult Although you may not be able to control the fact that you have asthma, you can take actions to prevent episodes of asthma (asthma attacks). These actions include:  Creating a written plan for managing and treating your asthma attacks (asthma action plan).  Monitoring your asthma.  Avoiding things that can irritate your airways or make your asthma symptoms worse (asthma triggers).  Taking your medicines as directed.  Acting quickly if you have signs or symptoms of an asthma attack.  What are some ways to prevent an asthma attack? Create a plan Work with your health care provider to create an asthma action plan. This plan should include:  A list of your asthma triggers and how to avoid them.  A list of symptoms that you experience during an asthma attack.  Information about when to take medicine and how much medicine to take.  Information to help you understand your peak flow measurements.  Contact information for your health care providers.  Daily actions that you can take to control asthma.  Monitor your asthma  To monitor your asthma:  Use your peak flow meter every morning and every evening for 2-3 weeks. Record the results in a journal. A drop in your peak flow numbers on one or more days may mean that you are starting to have an asthma attack, even if you are not having symptoms.  When you have asthma symptoms, write them down in a journal.  Avoid asthma triggers  Work with your health care provider to find out what your asthma triggers are. This can be done by:  Being tested for allergies.  Keeping a journal that notes when asthma attacks occur and what may have contributed to them.  Asking your health care provider whether other medical conditions make your asthma worse.  Common asthma triggers include:  Dust.  Smoke. This includes campfire smoke and secondhand smoke from tobacco products.  Pet dander.  Trees, grasses or  pollens.  Very cold, dry, or humid air.  Mold.  Foods that contain high amounts of sulfites.  Strong smells.  Engine exhaust and air pollution.  Aerosol sprays and fumes from household cleaners.  Household pests and their droppings, including dust mites and cockroaches.  Certain medicines, including NSAIDs.  Once you have determined your asthma triggers, take steps to avoid them. Depending on your triggers, you may be able to reduce the chance of an asthma attack by:  Keeping your home clean. Have someone dust and vacuum your home for you 1 or 2 times a week. If possible, have them use a high-efficiency particulate arrestance (HEPA) vacuum.  Washing your sheets weekly in hot water.  Using allergy-proof mattress covers and casings on your bed.  Keeping pets out of your home.  Taking care of mold and water problems in your home.  Avoiding areas where people smoke.  Avoiding using strong perfumes or odor sprays.  Avoid spending a lot of time outdoors when pollen counts are high and on very windy days.  Talking with your health care provider before stopping or starting any new medicines.  Medicines Take over-the-counter and prescription medicines only as told by your health care provider. Many asthma attacks can be prevented by carefully following your medicine schedule. Taking your medicines correctly is especially important when you cannot avoid certain asthma triggers. Even if you are doing well, do not stop taking your medicine and do not take less medicine. Act quickly If an asthma attack happens, acting quickly   can decrease how severe it is and how long it lasts. Take these actions:  Pay attention to your symptoms. If you are coughing, wheezing, or having difficulty breathing, do not wait to see if your symptoms go away on their own. Follow your asthma action plan.  If you have followed your asthma action plan and your symptoms are not improving, call your health care  provider or seek immediate medical care at the nearest hospital.  It is important to write down how often you need to use your fast-acting rescue inhaler. You can track how often you use an inhaler in your journal. If you are using your rescue inhaler more often, it may mean that your asthma is not under control. Adjusting your asthma treatment plan may help you to prevent future asthma attacks and help you to gain better control of your condition. How can I prevent an asthma attack when I exercise?  Exercise is a common asthma trigger. To prevent asthma attacks during exercise:  Follow advice from your health care provider about whether you should use your fast-acting inhaler before exercising. Many people with asthma experience exercise-induced bronchoconstriction (EIB). This condition often worsens during vigorous exercise in cold, humid, or dry environments. Usually, people with EIB can stay very active by using a fast-acting inhaler before exercising.  Avoid exercising outdoors in very cold or humid weather.  Avoid exercising outdoors when pollen counts are high.  Warm up and cool down when exercising.  Stop exercising right away if asthma symptoms start.  Consider taking part in exercises that are less likely to cause asthma symptoms such as:  Indoor swimming.  Biking.  Walking.  Hiking.  Playing football.  This information is not intended to replace advice given to you by your health care provider. Make sure you discuss any questions you have with your health care provider. Document Released: 02/28/2009 Document Revised: 11/11/2015 Document Reviewed: 08/27/2015 Elsevier Interactive Patient Education  2018 [employer]  

## 2017-10-10 NOTE — Progress Notes (Signed)
[place]  [phone]) [phone] [phone]  Clinic Follow up Note   Patient Care Team: [name] as PCP - General (Physician Assistant) [name], MD as Consulting Physician (Pulmonary Disease) [name], MD as Consulting Physician (Hematology)   Date of Service:  10/15/2017   CHIEF COMPLAINT: F/U metastatic breast cancer   SUMMARY OF ONCOLOGIC HISTORY: Oncology History   Cancer Staging Malignant neoplasm of overlapping sites of left breast in female, estrogen receptor positive ([place]) Staging form: Breast, AJCC 7th Edition - Clinical: Stage IIIA (T2, N2, M0) - Unsigned       Malignant neoplasm of overlapping sites of left breast in female, estrogen receptor positive ([place])   05/2008 Cancer Diagnosis    Left sided breast cancer (no path report available)      11/2008 Pathologic Stage    Stage IIIA: T2 N2       Neo-Adjuvant Chemotherapy    Paclitaxel weekly x 12; doxorubicin and cyclophosphamide x 4 - both given with trifiparnib (treated at [facility] in [place])      11/2008 Definitive Surgery    Left modified radiation mastectomy: invasive lobular carcinoma, ER+, PR+, HER2/neu negative, 5/22 LN positive       - 03/2009 Radiation Therapy    Adjuvant radiation to left chest wall      2011 - 08/2014 Anti-estrogen oral therapy    Tamoxifen 20 mg used briefly; discontinued due to uterine lining concerns; changed to anastrozole 1 mg daily (began 07/2100); discontinued by patient summer of 2016      05/26/2015 Progression    Iliac bone biopsy showed metastatic carcinoma consistent with a breast primary, ER positive, PR and HER-2 negative. Her staging CT, and a PET scan was negative for visceral metastasis.      05/2015 -  Anti-estrogen oral therapy    1. letrozole and Ibrance started March 2017             -Ibrance dose decreased to 75 mg daily  -Letrozole switched to Exemestane on 02/13/17 due to b/l rib pain.       11/16/2015 Miscellaneous    Xgeva monthly for bone metastasis       07/03/2016 Imaging    CT chest, abdomen and pelvis with contrast showed no evidence of metastasis.       02/07/2017 Imaging    CT AP W Contrast 02/07/17 IMPRESSION: 1. No CT findings for abdominal/pelvic metastatic disease. 2. No acute abdominal findings, mass lesions or adenopathy. 3. Status post cholecystectomy with mild associated common bile duct dilatation. 4. Somewhat thickened appendix appears relatively stable. No acute inflammatory process. 5. Stable mixed lytic and sclerotic osseous metastatic disease.       02/07/2017 Imaging    Bone Scan Whole Body 02/07/17 IMPRESSION: No scintigraphic evidence skeletal metastasis       08/12/2017 Imaging    Whole Boday Scan 08/12/17 IMPRESSION: Scattered degenerative type uptake as above with a questionable focus of increased tracer localization at L2 vertebral body versus artifact; no abnormality is seen at this site by CT. Consider either characterization by MR or attention on follow-up imaging.      08/12/2017 Imaging    CT AP W Contrast 08/12/17  IMPRESSION: Continued thickening of the appendix is noted without surrounding inflammation. It has maximum measured diameter is decreased compared to prior exam. There is no evidence of acute inflammation. Stable mixed lytic and sclerotic appearance of visualized skeleton is noted consistent with history of known osseous  metastases. No acute abnormality seen in the abdomen or pelvis.      08/22/2017 Surgery    APPENDECTOMY LAPAROSCOPIC ERAS PATHWAY by Dr. Malcolm Metro and REMOVAL OF LEFT TISSUE EXPANDER by Dr. Harlow Mares 08/22/17      08/22/2017 Pathology Results    Diagnosis 08/22/17  1. Appendix, Other than Incidental - METASTATIC CARCINOMA, CONSISTENT WITH BREAST PRIMARY. - CARCINOMA IS PRESENT AT THE SURGICAL RESECTION MARGIN AS WELL AS THE SEROSAL SURFACE. - LYMPHOVASCULAR INVASION IS IDENTIFIED. - SEE  COMMENT. 2. Breast, capsule, Left - DENSE FIBROUS TISSUE WITH MILD INFLAMMATION, INCLUDING A FEW SCATTERED MULTINUCLEATED GIANT CELLS. - THERE IS NO EVIDENCE OF MALIGNANCY.      08/27/2017 Imaging    CT AP W Contrast 08/27/17 IMPRESSION: No evidence of pulmonary embolus. Small pericardial effusion with uncertain clinical significance. No evidence of acute abnormalities within the chest, abdomen or pelvis. Area of fat stranding and small focus of gas within the anterior abdominal wall, immediately inferior to the umbilicus, probably at the site of laparoscopic access.      10/07/2017 Echocardiogram    10/07/2017 ECO Study Conclusions  - Procedure narrative: Transthoracic echocardiography. Image   quality was adequate. The study was technically difficult, as a   result of poor acoustic windows. - Left ventricle: The cavity size was normal. Wall thickness was   normal. Systolic function was normal. The estimated ejection   fraction was in the range of 60% to 65%. Wall motion was normal;   there were no regional wall motion abnormalities. Features are   consistent with a pseudonormal left ventricular filling pattern,   with concomitant abnormal relaxation and increased filling   pressure (grade 2 diastolic dysfunction).       INTERIM HISTORY: 01/17/17 Michaela Little is here for a follow up of her metastatic breast cancer. I am taking over her care from Dr. Jana Hakim. This is my first time seeing her.   Today she is on her off week of Ibrance and is on 34m. She notes she feels exhausted and is often SOB. She has been having right sided abdominal pain that was worse 2 weeks ago, she say symptom management clinic and ER. She has been having ongoing pain in the right groin area but worsened lately. She is also having pain around her left lower ribs. She has not tried gabapentin and she takes tramadol as needed for pain. Right now her pain is tolerable. Sometime her pain can be past 10/10  but now the pain is 6-7/10 She is taking her BP medication for her HTN, she started Flovent due to her current trouble breathing, given by Dr. WMelvyn Novas Her legs swell but no doppler has been done before. She also gets a lot of fluid in her stomach. She is also on Lasix, given by her PCP, Dr. GAltamease Oiler She has arthritis. She still has tissue expander in left breast where she has some pain and tenderness. Her PCP changed her heart medication because her heart rate was slow when he last saw her. She would like to get to the bottom of her issues as she has difficulty falling asleep at night.  [place] herself in a community residence[place] She has 5 children. She does not drive. She does have SCAT forms to be filled out.    CURRENT TREATMENT:    1. letrozole and Ibrance started March 2017   -Ibrance dose decreased to  75 mg daily, held on 06/03/2017 due to surgery, restarted around 09/19/17   -Letrozole switched to Exemestane on 02/13/17 due to b/l rib pain.  2. Xgeva started 11/16/2015, repeated monthly   INTERVAL HISTORY:  Michaela Little is here for a follow up of her metastatic breast cancer. She is here alone. She uses a walker. She is feeling better. She said that she had to see a pulmonary doctor and cardiologist for her SOB. Her PCP prescribed a nebulizer and uses an inhaler at home. She still has costal margin pain, especially on the right side. Her last Ibrance pill is today.    REVIEW OF SYSTEMS:  Constitutional: Denies fevers, chills or abnormal weight loss (+) uses walker Eyes: Denies blurriness of vision Ears, nose, mouth, throat, and face: Denies mucositis, sore throat  Respiratory: Denies cough or wheezes (+) SOB (+) Cough  Cardiovascular: Denies palpitation, chest discomfort (+) Slight lower leg edema Gastrointestinal:  Denies nausea, heartburn or change in bowel habits  MSK: (+) back pain, stable Breast: (+) she  feels a lump on her right lower midaxillary area Skin: Denies abnormal skin rashes Lymphatics: Denies new lymphadenopathy or easy bruising Neurological:Denies numbness, tingling or new weaknesses Behavioral/Psych: Mood is stable, no new changes  All other systems were reviewed with the patient and are negative.  MEDICAL HISTORY:  Past Medical History:  Diagnosis Date  . Anxiety   . Arthritis    hands, knees, hips  . Asthma   . Bronchitis   . Cancer ([place])   . Cervical stenosis (uterine cervix)   . Disorder of appendix    Enlarged  . Dyspnea on exertion   . GERD (gastroesophageal reflux disease)   . Headache(784.0)   . History of breast cancer    2010--  LEFT  s/p  mastectomy (in [place]) AND CHEMORADIATION--  NO RECURRENCE  . History of cervical dysplasia   . Hyperlipidemia   . Hypertension   . Malignant neoplasm of overlapping sites of left breast in female, estrogen receptor positive ([place]) 03/05/2013  . OSA (obstructive sleep apnea) moderate osa per study  09/2010   CPAP  , NOT USING ON REGULAR BASIS  . Pelvic pain in female   . Positive H. pylori test    08-05-2013  . Positive TB test    AS TEEN--  [place] W/ MEDS  . Refusal of blood transfusions as patient is Jehovah's Witness   . Seasonal allergies   . Uterine fibroid   . Wears glasses     SURGICAL HISTORY: Past Surgical History:  Procedure Laterality Date  . APPENDECTOMY  y  . CARDIOVASCULAR STRESS TEST  10-29-2012   low risk perfusion study/  no significant reversibity/ ef 66%/  normal wall motion  . CERVICAL CONIZATION W/BX  2012   in  [place] N/A 05/14/2012   Procedure: LAPAROSCOPIC CHOLECYSTECTOMY;  Surgeon: Ralene Ok, MD;  Location: [facility];  Service: General;  Laterality: N/A;  . COLONOSCOPY  2011   normal per patient - [place]  . DILATION AND CURETTAGE OF UTERUS  10/15/2011   Procedure: DILATATION AND CURETTAGE;  Surgeon: Melina Schools, MD;  Location: [facility];  Service: Gynecology;  Laterality: N/A;   Conization &  endocervical curettings  . EXAMINATION UNDER ANESTHESIA N/A 08/20/2013   Procedure: EXAM UNDER ANESTHESIA;  Surgeon: Margarette Asal, MD;  Location: [facility];  Service: Gynecology;  Laterality: N/A;  . [place]   right  . LAPAROSCOPIC  APPENDECTOMY N/A 08/22/2017   Procedure: APPENDECTOMY LAPAROSCOPIC ERAS PATHWAY;  Surgeon: Coralie Keens, MD;  Location: [facility];  Service: General;  Laterality: N/A;  . LAPAROSCOPIC ASSISTED VAGINAL HYSTERECTOMY N/A 10/26/2014   Procedure: HYSTERECTOMY ABDOMINAL ;  Surgeon: Molli Posey, MD;  Location: [facility];  Service: Gynecology;  Laterality: N/A;  . MASTECTOMY Left 11/2008  in [place]   . SALPINGOOPHORECTOMY Bilateral 10/26/2014   Procedure: BILATERAL SALPINGO OOPHORECTOMY;  Surgeon: Molli Posey, MD;  Location: [facility];  Service: Gynecology;  Laterality: Bilateral;  . TISSUE EXPANDER PLACEMENT Left 08/22/2017   Procedure: REMOVAL OF LEFT TISSUE EXPANDER;  Surgeon: Crissie Reese, MD;  Location: [facility];  Service: Plastics;  Laterality: Left;  . TISSUE EXPANDER REMOVAL Left 08/22/2017  . TRANSTHORACIC ECHOCARDIOGRAM  10-29-2012   mild lvh/  ef 60-65%/  grade II diastolic dysfunction/  trivial mr  &  tr    I have reviewed the social history and family history with the patient and they are unchanged from previous note.  ALLERGIES:  is allergic to lisinopril-hydrochlorothiazide; adhesive [tape]; fentanyl; gabapentin; hctz [hydrochlorothiazide]; latex; lisinopril; losartan potassium; other; and oxycodone.  MEDICATIONS:  Current Outpatient Medications  Medication Sig Dispense Refill  . albuterol (PROVENTIL HFA;VENTOLIN HFA) 108 (90 Base) MCG/ACT inhaler Inhale 2 puffs into the lungs every 6 (six) hours as needed for wheezing or shortness of breath.    Marland Kitchen amLODipine (NORVASC) 10 MG tablet Take 1 tablet (10 mg total) by mouth daily. 90 tablet 3  . benzonatate (TESSALON) 100 MG capsule Take 1 capsule (100  mg total) by mouth 2 (two) times daily as needed for cough. 30 capsule 0  . carvedilol (COREG) 6.25 MG tablet Take 1 tablet (6.25 mg total) by mouth 2 (two) times daily with a meal. (Patient taking differently: Take 12.5 mg by mouth daily. ) 180 tablet 3  . exemestane (AROMASIN) 25 MG tablet Take 1 tablet (25 mg total) by mouth daily after breakfast. 90 tablet 1  . fluticasone (FLOVENT HFA) 110 MCG/ACT inhaler Inhale 2 puffs into the lungs 2 (two) times daily. With spacer (Patient taking differently: Inhale 2 puffs into the lungs 2 (two) times daily as needed (for respiratory issues.). With spacer) 1 Inhaler 5  . Fluticasone-Salmeterol (ADVAIR DISKUS) 250-50 MCG/DOSE AEPB Inhale 1 puff into the lungs 2 (two) times daily. 1 each 5  . furosemide (LASIX) 20 MG tablet Take 1 tablet (20 mg total) by mouth daily as needed (for fluid retention.). 30 tablet 5  . ipratropium-albuterol (DUONEB) 0.5-2.5 (3) MG/3ML SOLN Take 3 mLs by nebulization every 6 (six) hours as needed. 360 mL 5  . levofloxacin (LEVAQUIN) 500 MG tablet Take 1 tablet (500 mg total) by mouth daily. 7 tablet 0  . loratadine (CLARITIN) 10 MG tablet Take 20 mg by mouth daily.     . montelukast (SINGULAIR) 10 MG tablet Take 1 tablet (10 mg total) by mouth at bedtime. 30 tablet 5  . naproxen (NAPROSYN) 500 MG tablet Take 1 tablet (500 mg total) by mouth 2 (two) times daily with a meal. (Patient taking differently: Take 500 mg by mouth 2 (two) times daily as needed (for pain.). ) 30 tablet 0  . palbociclib (IBRANCE) 75 MG capsule TAKE 1 CAPSULE (75MG) BY MOUTH DAILY. TAKE WHOLE WITH FOOD. 21 capsule 2  . pantoprazole (PROTONIX) 40 MG tablet Take 1 tablet (40 mg total) by mouth daily. Take 30-60 min before first meal of the day 30 tablet 2  .  promethazine (PHENERGAN) 25 MG tablet Take 1 tablet (25 mg total) by mouth every 6 (six) hours as needed for nausea or vomiting. 30 tablet 0  . ranitidine (ZANTAC) 300 MG tablet Take 1 tablet (300 mg total) by  mouth at bedtime. (Patient taking differently: Take 300 mg by mouth at bedtime as needed (for indigestion.). ) 30 tablet 5  . tobramycin (TOBREX) 0.3 % ophthalmic solution Place 2 drops into both eyes every 4 (four) hours. 5 mL 0  . traMADol (ULTRAM) 50 MG tablet Take 1-2 tablets (50-100 mg total) by mouth every 6 (six) hours as needed for moderate pain or severe pain. 30 tablet 0   No current facility-administered medications for this visit.     PHYSICAL EXAMINATION:  ECOG PERFORMANCE STATUS: 2-3  Vitals:   10/15/17 1310  BP: 124/65  Pulse: 77  Resp: 17  Temp: 98.5 F (36.9 C)  SpO2: 100%   Filed Weights   10/15/17 1310  Weight: 266 lb 11.2 oz (121 kg)    GENERAL:alert, no distress and comfortable (+) obese, uses walker SKIN: skin color, texture, turgor are normal, no rashes or significant lesions EYES: normal, Conjunctiva are pink and non-injected, sclera clear OROPHARYNX:no exudate, no erythema and lips, buccal mucosa, and tongue normal  NECK: supple, thyroid normal size, non-tender, (+)palpable right thyroid nodule  LYMPH:  no palpable lymphadenopathy in the cervical, axillary or inguinal LUNGS: clear to auscultation and percussion with normal breathing effort HEART: regular rate & rhythm and no murmurs and no lower extremity edema ABDOMEN:abdomen soft, non-tender and normal bowel sounds (+) S/p Appendectomy: 3 laparoscopic surgical incision across lower abdomen are healing well.    Musculoskeletal:no cyanosis of digits and no clubbing  NEURO: alert & oriented x 3 with fluent speech, no focal motor/sensory deficits Breast: (+) S/p left mastectomy and recent implant removal, surgical incision is healing well, no discharge or skin erythema, drainage tube removed. no palpable mass or adenopathy. right breast exam negative    LABORATORY DATA:  I have reviewed the data as listed CBC Latest Ref Rng & Units 10/15/2017 09/25/2017 09/05/2017  WBC 3.9 - 10.3 K/uL 1.9(L) 3.7 4.5    Hemoglobin 11.6 - 15.9 g/dL 9.7(L) 10.3(L) 11.3(L)  Hematocrit 34.8 - 46.6 % 29.3(L) 32.3(L) 34.9  Platelets 145 - 400 K/uL 144(L) 271 250     CMP Latest Ref Rng & Units 10/15/2017 09/05/2017 08/27/2017  Glucose 70 - 99 mg/dL 105(H) 118 129(H)  BUN 6 - 20 mg/dL 14 14 12   Creatinine 0.44 - 1.00 mg/dL 0.87 0.77 0.87  Sodium 135 - 145 mmol/L 141 139 138  Potassium 3.5 - 5.1 mmol/L 4.0 4.3 4.0  Chloride 98 - 111 mmol/L 107 106 102  CO2 22 - 32 mmol/L 25 25 27   Calcium 8.9 - 10.3 mg/dL 9.8 9.3 8.9  Total Protein 6.5 - 8.1 g/dL 7.4 7.6 7.5  Total Bilirubin 0.3 - 1.2 mg/dL 1.2 0.8 1.4(H)  Alkaline Phos 38 - 126 U/L 128(H) 147 148(H)  AST 15 - 41 U/L 24 25 62(H)  ALT 0 - 44 U/L 11 14 43    PATHOLOGY   Diagnosis 08/22/17  1. Appendix, Other than Incidental - METASTATIC CARCINOMA, CONSISTENT WITH BREAST PRIMARY. - CARCINOMA IS PRESENT AT THE SURGICAL RESECTION MARGIN AS WELL AS THE SEROSAL SURFACE. - LYMPHOVASCULAR INVASION IS IDENTIFIED. - SEE COMMENT. 2. Breast, capsule, Left - DENSE FIBROUS TISSUE WITH MILD INFLAMMATION, INCLUDING A FEW SCATTERED MULTINUCLEATED GIANT CELLS. - THERE IS NO EVIDENCE OF MALIGNANCY.  Microscopic Comment 1. Grossly, the appendiceal wall is thickened. Histologic evaluation reveals diffuse involvement by a malignant process, consisting of small relatively uniform discohesive tumor cells involving the submucosa as well as the periappendiceal soft tissue, including the serosal surface. Immunohistochemical stains were performed revealing that the tumor cells are positive for cytokeratin 7, GATA-3, estrogen receptor and focally positive for GCDFP. Tumor cells are negative for CD56, cytokeratin 20, chromogranin and synaptophysin. A Ki-67 stain is positive in approximately 70% of the tumor cells. Overall, the findings are consistent with metastatic carcinoma of breast primary and may be a lobular phenotype. A complete breast prognostic profile will be performed and  the results reported separately. Dr. Vicente Males has reviewed selected slides and concurs with this interpretation. (JBK:kh 08-25-17)    PROCEDURES 10/07/2017 ECO Study Conclusions  - Procedure narrative: Transthoracic echocardiography. Image   quality was adequate. The study was technically difficult, as a   result of poor acoustic windows. - Left ventricle: The cavity size was normal. Wall thickness was   normal. Systolic function was normal. The estimated ejection   fraction was in the range of 60% to 65%. Wall motion was normal;   there were no regional wall motion abnormalities. Features are   consistent with a pseudonormal left ventricular filling pattern,   with concomitant abnormal relaxation and increased filling   pressure (grade 2 diastolic dysfunction).  ECHO 01/22/17 Study Conclusions - Left ventricle: The cavity size was normal. There was moderate concentric hypertrophy. Systolic function was normal. The estimated ejection fraction was in the range of 55% to 60%. Wall motion was normal; there were no regional wall motion abnormalities. Doppler parameters are consistent with abnormal left ventricular relaxation (grade 1 diastolic dysfunction).   RADIOGRAPHIC STUDIES: I have personally reviewed the radiological images as listed and agreed with the findings in the report. No results found.    CT AP W Contrast 08/27/17 IMPRESSION: No evidence of pulmonary embolus. Small pericardial effusion with uncertain clinical significance. No evidence of acute abnormalities within the chest, abdomen or pelvis. Area of fat stranding and small focus of gas within the anterior abdominal wall, immediately inferior to the umbilicus, probably at the site of laparoscopic access.   Whole Body Scan 08/12/17 IMPRESSION: Scattered degenerative type uptake as above with a questionable focus of increased tracer localization at L2 vertebral body versus artifact; no abnormality is seen at  this site by CT. Consider either characterization by MR or attention on follow-up imaging.   CT AP W Contrast 08/12/17  IMPRESSION: Continued thickening of the appendix is noted without surrounding inflammation. It has maximum measured diameter is decreased compared to prior exam. There is no evidence of acute inflammation. Stable mixed lytic and sclerotic appearance of visualized skeleton is noted consistent with history of known osseous metastases. No acute abnormality seen in the abdomen or pelvis.   CT AP W Contrast 02/07/17 IMPRESSION: 1. No CT findings for abdominal/pelvic metastatic disease. 2. No acute abdominal findings, mass lesions or adenopathy. 3. Status post cholecystectomy with mild associated common bile duct dilatation. 4. Somewhat thickened appendix appears relatively stable. No acute inflammatory process. 5. Stable mixed lytic and sclerotic osseous metastatic disease.   Bone Scan Whole Body 02/07/17 IMPRESSION: No scintigraphic evidence skeletal metastasis    ASSESSMENT & PLAN:  Michaela Little is a 57 y.o. female with a history of HTN, HLD, Arthritis, Asthma, Sleep Apnea, and H. Pylori.   1. 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 -She has metastatic ER positive breast cancer to bones, has been on letrozole and Ibrance since March 2017. She has had her oncological care both in [place] and [place], she had a PET scan in December 2017 and  CT scan in April 2018. Due to her worsening bilateral rib cage pain, I obtained restaging CT abdomen and pelvis with contrast, and bone scan on 02/07/2017. I previously reviewed the scan findings with pt, which showed stable lipomatosis, not hypermetabolic on bone scan, no other new lesions.  No evidence of disease progression on the restaging scan -Given her increase b/l rib cage pain, possible  related to her letrozole along with her bone mets, I switched her to Exemestane (02/13/17) and her bone pain improved.  -She underwent appendectomy by Dr. Ninfa Linden and breast reconstruction to remove her breast implant by Dr. Harlow Mares on 08/22/17. The pathology from her surgery showed evidence of metastatic breast cancer in her appendix, ER positive/PR and HER2 negative. I recommend she continue current regimen. She can restart Ibrance once she recovers from surgery .  -I discussed her goal of therapy is palliative and she will continue on this for as long as she can tolerate of her disease progresses.  -We reviewed and discussed her CT AP and bone scan from 08/12/17 which shows no PE, but shows mild pericardial effusion. No current need for thoracentesis, will monitor. Bone scan shows Scattered degenerative type uptake at L2 vertebral body versus artifact; no abnormality is seen at this site by CT. Pericardial effusion is new from previous scan, echo on 03/09/2018 showed no pericardial effusion.  Normal EF, grade 2 diastolic dysfunction.  She was referred to cardiology by her primary care physician. -Labs reviewed, she has developed mild pancytopenia, related to [facility].  Adequate to continue treatment. -Her tumor marker CA 27.29 has recently increased, likely due to her holding Ibrance and a short period of exemestane which she has restarted  -Continue Exemestane  -Continue Xgeva today and monthly  -F/u in 4-5 weeks    2. Primary breast cancer of left breast overlapping sites diagnosed March of 2010 and treated neo-adjuvantly at [facility] with (a) paclitaxel weekly x12 and (b) doxorubicin/ cyclophosphamide in dose dense fashion x4, both given with tifiparnib.  -definitive left modified radical mastectomy September of 2010 for a T2 N2 or stage IIIA invasive lobular breast cancer, estrogen and progesterone receptor positive, HER-2 negative,  -post mastectomy radiation completed January of 2011, -briefly  on tamoxifen, discontinued it because of uterine lining concerns.  On anastrozole since May 2012 with good tolerance. Held between November and December 2013 due to GI issues, unrelated to cancer or its treatment. Discontinued by the patient summer of 2016  3. Bilateral rib cage pain, Left Breast pain  -She takes Tylenol and tramadol as needed.  -Bone pain has increased to bilateral ribs. I have switched her letrozole to Exemestane. Her pain has improved. -She previously declined narcotics, but I suggested she continue to take tylenol, Advil or aleve for her pain as we want to help her control her pain.  -I refilled her Tramadol today (09/05/17), pain related to her breast surgery now.  -I recommend physical therapy to help while she recovers, she is interested. I will send PT referral.   4. Lower Extremity Edema -She is on Lasix, given by her PCP Dr. Altamease Oiler -I previously suggested compression socks and advices her to elevate her feet when sitting  at home.  -Repeat echocardiogram in 12/2016 and 09/2017 shows normal EF  5. Dyspnea, Cough -Uses Flovent -she will f/u with pulmonologist Dr. Melvyn Novas    6. Insomnia  -She is on hydroxyzine already  -I suggested her to try melatonin previously -She was given gabapentin in the past, I previously advised if it is not working for her she can stop.   7. Left breast implant rupture, resolved with surgery -She underwent breast reconstruction on 08/22/17 and had implant completely removed by Dr. Marica Otter   8. 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 Ninfa Linden on 08/22/17 which was found to be metastasis of her breast cancer.   9. Goal of care discussion  -We discussed the incurable nature of her cancer, and the overall poor prognosis, especially if she does not have good response to chemotherapy or progress on  chemo -The patient understands the goal of care is palliative. -she is full code now   PLAN:  - Continue current therapy with exemestane,  Ibrance 75 mg, 3 weeks on, one-week off, and monthly Xgeva - F/u, labs and injection in 4-5 weeks.   No orders of the defined types were placed in this encounter.  All questions were answered. The patient knows to call the clinic with any problems, questions or concerns. No barriers to learning was detected.  I spent 25 minutes counseling the patient face to face. The total time spent in the appointment was 30 minutes and more than 50% was on counseling and review of test results  I, Noor Dweik am acting as scribe for Dr. [name].  I have reviewed the above documentation for accuracy and completeness, and I agree with the above.     [name], MD 10/15/2017 6:26 PM

## 2017-10-10 NOTE — Progress Notes (Signed)
Subjective:  Patient ID: Michaela Little, female    DOB: 21-Nov-1960  Age: 57 y.o. MRN: [id number]  CC: Need for nebulizer and SCAT paperwork  HPI  Michaela Little a 58 y.o.femalewith a PMH of Anxiety, Breast CA, HTN, OSA, Asthma, cholecystectomy, and dyspnea presents with request for a prescription for a new nebulizer. Old nebulizer "short circuited" after her last visit here 15 days ago. Says she has been going back and forth with [facility] trying to get a nebulizer from them. Pt's asthma has been mild to moderate and exacerbated by the hot summer temperature. Says she uses Albuterol every other day. Would like the nebulizer to use in case of asthma attack. Used to have Advair Diskus with better control of asthma. Does not endorse current wheezing or asthma exacerbation.      Outpatient Medications Prior to Visit  Medication Sig Dispense Refill  . albuterol (PROVENTIL HFA;VENTOLIN HFA) 108 (90 Base) MCG/ACT inhaler Inhale 2 puffs into the lungs every 6 (six) hours as needed for wheezing or shortness of breath.    Marland Kitchen amLODipine (NORVASC) 10 MG tablet Take 1 tablet (10 mg total) by mouth daily. 90 tablet 3  . benzonatate (TESSALON) 100 MG capsule Take 1 capsule (100 mg total) by mouth 2 (two) times daily as needed for cough. 30 capsule 0  . carvedilol (COREG) 6.25 MG tablet Take 1 tablet (6.25 mg total) by mouth 2 (two) times daily with a meal. (Patient taking differently: Take 12.5 mg by mouth daily. ) 180 tablet 3  . exemestane (AROMASIN) 25 MG tablet Take 1 tablet (25 mg total) by mouth daily after breakfast. 90 tablet 1  . fluticasone (FLOVENT HFA) 110 MCG/ACT inhaler Inhale 2 puffs into the lungs 2 (two) times daily. With spacer (Patient taking differently: Inhale 2 puffs into the lungs 2 (two) times daily as needed (for respiratory issues.). With spacer) 1 Inhaler 5  . furosemide (LASIX) 20 MG tablet Take 1 tablet (20 mg total) by mouth daily as needed (for fluid retention.). 30 tablet 5   . ipratropium-albuterol (DUONEB) 0.5-2.5 (3) MG/3ML SOLN Take 3 mLs by nebulization every 6 (six) hours as needed. 360 mL 5  . levofloxacin (LEVAQUIN) 500 MG tablet Take 1 tablet (500 mg total) by mouth daily. 7 tablet 0  . loratadine (CLARITIN) 10 MG tablet Take 20 mg by mouth daily.     . naproxen (NAPROSYN) 500 MG tablet Take 1 tablet (500 mg total) by mouth 2 (two) times daily with a meal. (Patient taking differently: Take 500 mg by mouth 2 (two) times daily as needed (for pain.). ) 30 tablet 0  . palbociclib (IBRANCE) 75 MG capsule TAKE 1 CAPSULE (75MG ) BY MOUTH DAILY. TAKE WHOLE WITH FOOD. 21 capsule 2  . pantoprazole (PROTONIX) 40 MG tablet Take 1 tablet (40 mg total) by mouth daily. Take 30-60 min before first meal of the day 30 tablet 2  . promethazine (PHENERGAN) 25 MG tablet Take 1 tablet (25 mg total) by mouth every 6 (six) hours as needed for nausea or vomiting. 30 tablet 0  . promethazine-dextromethorphan (PROMETHAZINE-DM) 6.25-15 MG/5ML syrup Take 5 mLs by mouth 4 (four) times daily as needed. (Patient not taking: Reported on 09/25/2017) 118 mL 0  . ranitidine (ZANTAC) 300 MG tablet Take 1 tablet (300 mg total) by mouth at bedtime. (Patient taking differently: Take 300 mg by mouth at bedtime as needed (for indigestion.). ) 30 tablet 5  . tobramycin (TOBREX) 0.3 % ophthalmic solution Place  2 drops into both eyes every 4 (four) hours. 5 mL 0  . traMADol (ULTRAM) 50 MG tablet Take 1-2 tablets (50-100 mg total) by mouth every 6 (six) hours as needed for moderate pain or severe pain. 30 tablet 0   No facility-administered medications prior to visit.      ROS Review of Systems  Constitutional: Negative for chills, fever and malaise/fatigue.  Eyes: Negative for blurred vision.  Respiratory: Negative for shortness of breath.   Cardiovascular: Negative for chest pain and palpitations.  Gastrointestinal: Negative for abdominal pain and nausea.  Genitourinary: Negative for dysuria and  hematuria.  Musculoskeletal: Negative for joint pain and myalgias.  Skin: Negative for rash.  Neurological: Negative for tingling and headaches.  Psychiatric/Behavioral: Negative for depression. The patient is not nervous/anxious.     Objective:  BP 129/80 (BP Location: Left Arm, Patient Position: Sitting, Cuff Size: Large)   Pulse 67   Temp 98 F (36.7 C) (Oral)   Ht 5\' 7"  (1.702 m)   Wt 271 lb (122.9 kg)   LMP 02/25/2009   SpO2 99%   BMI 42.44 kg/m   BP/Weight 10/10/2017 09/25/2017 1/61/0960  Systolic BP 454 098 119  Diastolic BP 80 71 71  Wt. (Lbs) 271 266.4 -  BMI 42.44 41.72 44.01      Physical Exam  Constitutional: She is oriented to person, place, and time.  Well developed, well nourished, NAD, polite  HENT:  Head: Normocephalic and atraumatic.  Eyes: No scleral icterus.  Neck: Normal range of motion. Neck supple. No thyromegaly present.  Cardiovascular: Normal rate, regular rhythm, normal heart sounds and intact distal pulses. Exam reveals no gallop and no friction rub.  No murmur heard. Pulmonary/Chest: Effort normal and breath sounds normal. No stridor. No respiratory distress. She has no wheezes. She has no rales.  Musculoskeletal: She exhibits no edema.  Neurological: She is alert and oriented to person, place, and time.  Skin: Skin is warm and dry. No rash noted. No erythema. No pallor.  Psychiatric: She has a normal mood and affect. Her behavior is normal. Thought content normal.  Vitals reviewed.    Assessment & Plan:   1. Administrative encounter - SCAT eligibility questionnaire form filled out.   2. Mild persistent asthma without complication - Begin montelukast (SINGULAIR) 10 MG tablet; Take 1 tablet (10 mg total) by mouth at bedtime.  Dispense: 30 tablet; Refill: 5 - Begin Fluticasone-Salmeterol (ADVAIR DISKUS) 250-50 MCG/DOSE AEPB; Inhale 1 puff into the lungs 2 (two) times daily.  Dispense: 1 each; Refill: 5 - Begin DME Nebulizer machine -  Continue to use albuterol as rescue inhaler.    Meds ordered this encounter  Medications  . montelukast (SINGULAIR) 10 MG tablet    Sig: Take 1 tablet (10 mg total) by mouth at bedtime.    Dispense:  30 tablet    Refill:  5    Order Specific Question:   Supervising Provider    Answer:   Charlott Rakes [[id number]]  . Fluticasone-Salmeterol (ADVAIR DISKUS) 250-50 MCG/DOSE AEPB    Sig: Inhale 1 puff into the lungs 2 (two) times daily.    Dispense:  1 each    Refill:  5    Order Specific Question:   Supervising Provider    Answer:   Charlott Rakes [[id number]]    Follow-up: Return in about 3 months (around 01/10/2018).   Clent Demark PA

## 2017-10-15 ENCOUNTER — Other Ambulatory Visit: Payer: Medicare Other

## 2017-10-15 ENCOUNTER — Ambulatory Visit: Payer: Medicare Other | Admitting: Primary Care

## 2017-10-15 ENCOUNTER — Inpatient Hospital Stay: Payer: Medicare Other | Attending: Hematology

## 2017-10-15 ENCOUNTER — Telehealth: Payer: Self-pay | Admitting: Hematology

## 2017-10-15 ENCOUNTER — Encounter: Payer: Self-pay | Admitting: Hematology

## 2017-10-15 ENCOUNTER — Ambulatory Visit: Payer: Medicare Other | Admitting: Hematology

## 2017-10-15 ENCOUNTER — Inpatient Hospital Stay: Payer: Medicare Other

## 2017-10-15 ENCOUNTER — Inpatient Hospital Stay (HOSPITAL_BASED_OUTPATIENT_CLINIC_OR_DEPARTMENT_OTHER): Payer: Medicare Other | Admitting: Hematology

## 2017-10-15 VITALS — BP 124/65 | HR 77 | Temp 98.5°F | Resp 17 | Ht 67.0 in | Wt 266.7 lb

## 2017-10-15 DIAGNOSIS — Z170 Estrogen receptor positive status [ER+]: Secondary | ICD-10-CM

## 2017-10-15 DIAGNOSIS — R0781 Pleurodynia: Secondary | ICD-10-CM | POA: Insufficient documentation

## 2017-10-15 DIAGNOSIS — C50912 Malignant neoplasm of unspecified site of left female breast: Secondary | ICD-10-CM

## 2017-10-15 DIAGNOSIS — C50812 Malignant neoplasm of overlapping sites of left female breast: Secondary | ICD-10-CM | POA: Diagnosis present

## 2017-10-15 DIAGNOSIS — C7951 Secondary malignant neoplasm of bone: Secondary | ICD-10-CM

## 2017-10-15 DIAGNOSIS — G4733 Obstructive sleep apnea (adult) (pediatric): Secondary | ICD-10-CM

## 2017-10-15 DIAGNOSIS — Z7189 Other specified counseling: Secondary | ICD-10-CM

## 2017-10-15 DIAGNOSIS — C785 Secondary malignant neoplasm of large intestine and rectum: Secondary | ICD-10-CM | POA: Diagnosis not present

## 2017-10-15 DIAGNOSIS — R0602 Shortness of breath: Secondary | ICD-10-CM

## 2017-10-15 LAB — COMPREHENSIVE METABOLIC PANEL
ALBUMIN: 3.6 g/dL (ref 3.5–5.0)
ALT: 11 U/L (ref 0–44)
ANION GAP: 9 (ref 5–15)
AST: 24 U/L (ref 15–41)
Alkaline Phosphatase: 128 U/L — ABNORMAL HIGH (ref 38–126)
BILIRUBIN TOTAL: 1.2 mg/dL (ref 0.3–1.2)
BUN: 14 mg/dL (ref 6–20)
CHLORIDE: 107 mmol/L (ref 98–111)
CO2: 25 mmol/L (ref 22–32)
Calcium: 9.8 mg/dL (ref 8.9–10.3)
Creatinine, Ser: 0.87 mg/dL (ref 0.44–1.00)
GFR calc Af Amer: >60 mL/min (ref >60)
Glucose, Bld: 105 mg/dL — ABNORMAL HIGH (ref 70–99)
Potassium: 4 mmol/L (ref 3.5–5.1)
Sodium: 141 mmol/L (ref 135–145)
TOTAL PROTEIN: 7.4 g/dL (ref 6.5–8.1)

## 2017-10-15 LAB — CBC WITH DIFFERENTIAL/PLATELET
BASOS ABS: 0 10*3/uL (ref 0.0–0.1)
BASOS PCT: 1 %
EOS ABS: 0 10*3/uL (ref 0.0–0.5)
EOS PCT: 1 %
HCT: 29.3 % — ABNORMAL LOW (ref 34.8–46.6)
Hemoglobin: 9.7 g/dL — ABNORMAL LOW (ref 11.6–15.9)
LYMPHS PCT: 54 %
Lymphs Abs: 1 10*3/uL (ref 0.9–3.3)
MCH: 26.9 pg (ref 25.1–34.0)
MCHC: 33.1 g/dL (ref 31.5–36.0)
MCV: 81.4 fL (ref 79.5–101.0)
MONO ABS: 0.1 10*3/uL (ref 0.1–0.9)
Monocytes Relative: 5 %
Neutro Abs: 0.7 10*3/uL — ABNORMAL LOW (ref 1.5–6.5)
Neutrophils Relative %: 39 %
PLATELETS: 144 10*3/uL — AB (ref 145–400)
RBC: 3.6 MIL/uL — AB (ref 3.70–5.45)
RDW: 16.1 % — AB (ref 11.2–14.5)
WBC: 1.9 10*3/uL — AB (ref 3.9–10.3)

## 2017-10-15 MED ORDER — DENOSUMAB 120 MG/1.7ML ~~LOC~~ SOLN
SUBCUTANEOUS | Status: AC
  Filled 2017-10-15: qty 1.7

## 2017-10-15 MED ORDER — DENOSUMAB 120 MG/1.7ML ~~LOC~~ SOLN
120.0000 mg | Freq: Once | SUBCUTANEOUS | Status: AC
  Administered 2017-10-15: 120 mg via SUBCUTANEOUS

## 2017-10-15 NOTE — Progress Notes (Deleted)
@Patient  ID: Michaela Little, female    DOB: September 14, 1960, 57 y.o.   MRN: [id number]  No chief complaint on file.   Referring provider: [name]  HPI: 57 year old female, former smoker (quit in 1990).  History of hypertension, asthma, OSA, allergic rhinitis, GERD, multinodular thyroid, breast cancer s/p mastectomy and radiation, cholecystectomy, appendectomy, bone metastases, obesity. Patient of Dr. Melvyn Novas, last seen on October 18, 2016 for dyspnea and UACS + RAST to mold and trees.  IgE 1082.  Seen by primary care 7/3 for complaints of chronic cough and fever.  There are plans to check echocardiogram d/t past Adriamcyin use which can cause heart failure.  She was given levaquin 500mg  x 7 days and tessalone perles.  Started on Singulair and Advair 250 /50.  Set up with DME for nebulizers.  Labs obtained on 7/3, WBC 3.7. BNP normal.  Follows with oncology for breast cancer, she was seen today toda (note not available for review). S/p left modified radical mastectomy 2010 and post radiation 2011. Found to have a metastasis to appendix, patient underwent laproscopic appendectomy May 30th 2019. Left breast tissue expander was also removed on 5/30. On letrozole and Ibrance since March 2017.      10/15/2017 Patient presents today with complaints of cough.    Allergies  Allergen Reactions  . Lisinopril-Hydrochlorothiazide Itching  . Adhesive [Tape] Itching and Other (See Comments)    Redness  . Fentanyl Other (See Comments)    GI upset and drowsiness  . Gabapentin Other (See Comments)    Auditory hallucinations  . Hctz [Hydrochlorothiazide] Itching  . Latex Itching  . Lisinopril Itching  . Losartan Potassium Other (See Comments)    Makes her feel "bad"   . Other Other (See Comments)    FRUIT FLAVORED TUMS=HIVES  . Oxycodone     GI upset, drowsy    Immunization History  Administered Date(s) Administered  . Influenza Split 12/25/2011  . Influenza,inj,Quad PF,6+ Mos  03/25/2013, 12/25/2013, 11/21/2015, 01/17/2017  . Pneumococcal Polysaccharide-23 03/06/2011    Past Medical History:  Diagnosis Date  . Anxiety   . Arthritis    hands, knees, hips  . Asthma   . Bronchitis   . Cancer ([place])   . Cervical stenosis (uterine cervix)   . Disorder of appendix    Enlarged  . Dyspnea on exertion   . GERD (gastroesophageal reflux disease)   . Headache(784.0)   . History of breast cancer    2010--  LEFT  s/p  mastectomy (in [place]) AND CHEMORADIATION--  NO RECURRENCE  . History of cervical dysplasia   . Hyperlipidemia   . Hypertension   . Malignant neoplasm of overlapping sites of left breast in female, estrogen receptor positive ([place]) 03/05/2013  . OSA (obstructive sleep apnea) moderate osa per study  09/2010   CPAP  , NOT USING ON REGULAR BASIS  . Pelvic pain in female   . Positive H. pylori test    08-05-2013  . Positive TB test    AS TEEN--  [place] W/ MEDS  . Refusal of blood transfusions as patient is Jehovah's Witness   . Seasonal allergies   . Uterine fibroid   . Wears glasses     Tobacco History: Social History   Tobacco Use  Smoking Status Former Smoker  . Packs/day: 0.30  . Years: 10.00  . Pack years: 3.00  . Types: Cigarettes  . Last attempt to quit: 03/26/1988  . Years since quitting: 29.5  Smokeless Tobacco  Never Used   Counseling given: Not Answered   Outpatient Medications Prior to Visit  Medication Sig Dispense Refill  . albuterol (PROVENTIL HFA;VENTOLIN HFA) 108 (90 Base) MCG/ACT inhaler Inhale 2 puffs into the lungs every 6 (six) hours as needed for wheezing or shortness of breath.    Marland Kitchen amLODipine (NORVASC) 10 MG tablet Take 1 tablet (10 mg total) by mouth daily. 90 tablet 3  . benzonatate (TESSALON) 100 MG capsule Take 1 capsule (100 mg total) by mouth 2 (two) times daily as needed for cough. 30 capsule 0  . carvedilol (COREG) 6.25 MG tablet Take 1 tablet (6.25 mg total) by mouth 2 (two) times daily with a meal. (Patient taking  differently: Take 12.5 mg by mouth daily. ) 180 tablet 3  . exemestane (AROMASIN) 25 MG tablet Take 1 tablet (25 mg total) by mouth daily after breakfast. 90 tablet 1  . fluticasone (FLOVENT HFA) 110 MCG/ACT inhaler Inhale 2 puffs into the lungs 2 (two) times daily. With spacer (Patient taking differently: Inhale 2 puffs into the lungs 2 (two) times daily as needed (for respiratory issues.). With spacer) 1 Inhaler 5  . Fluticasone-Salmeterol (ADVAIR DISKUS) 250-50 MCG/DOSE AEPB Inhale 1 puff into the lungs 2 (two) times daily. 1 each 5  . furosemide (LASIX) 20 MG tablet Take 1 tablet (20 mg total) by mouth daily as needed (for fluid retention.). 30 tablet 5  . ipratropium-albuterol (DUONEB) 0.5-2.5 (3) MG/3ML SOLN Take 3 mLs by nebulization every 6 (six) hours as needed. 360 mL 5  . levofloxacin (LEVAQUIN) 500 MG tablet Take 1 tablet (500 mg total) by mouth daily. 7 tablet 0  . loratadine (CLARITIN) 10 MG tablet Take 20 mg by mouth daily.     . montelukast (SINGULAIR) 10 MG tablet Take 1 tablet (10 mg total) by mouth at bedtime. 30 tablet 5  . naproxen (NAPROSYN) 500 MG tablet Take 1 tablet (500 mg total) by mouth 2 (two) times daily with a meal. (Patient taking differently: Take 500 mg by mouth 2 (two) times daily as needed (for pain.). ) 30 tablet 0  . palbociclib (IBRANCE) 75 MG capsule TAKE 1 CAPSULE (75MG ) BY MOUTH DAILY. TAKE WHOLE WITH FOOD. 21 capsule 2  . pantoprazole (PROTONIX) 40 MG tablet Take 1 tablet (40 mg total) by mouth daily. Take 30-60 min before first meal of the day 30 tablet 2  . promethazine (PHENERGAN) 25 MG tablet Take 1 tablet (25 mg total) by mouth every 6 (six) hours as needed for nausea or vomiting. 30 tablet 0  . ranitidine (ZANTAC) 300 MG tablet Take 1 tablet (300 mg total) by mouth at bedtime. (Patient taking differently: Take 300 mg by mouth at bedtime as needed (for indigestion.). ) 30 tablet 5  . tobramycin (TOBREX) 0.3 % ophthalmic solution Place 2 drops into both  eyes every 4 (four) hours. 5 mL 0  . traMADol (ULTRAM) 50 MG tablet Take 1-2 tablets (50-100 mg total) by mouth every 6 (six) hours as needed for moderate pain or severe pain. 30 tablet 0   No facility-administered medications prior to visit.       Review of Systems  Review of Systems   Physical Exam  LMP 02/25/2009  Physical Exam   Lab Results:  CBC    Component Value Date/Time   WBC 1.9 (L) 10/15/2017 1236   RBC 3.60 (L) 10/15/2017 1236   HGB 9.7 (L) 10/15/2017 1236   HGB 10.3 (L) 09/25/2017 1420   HGB 11.8 03/29/2017  1048   HCT 29.3 (L) 10/15/2017 1236   HCT 32.3 (L) 09/25/2017 1420   HCT 36.0 03/29/2017 1048   PLT 144 (L) 10/15/2017 1236   PLT 271 09/25/2017 1420   MCV 81.4 10/15/2017 1236   MCV 84 09/25/2017 1420   MCV 91.0 03/29/2017 1048   MCH 26.9 10/15/2017 1236   MCHC 33.1 10/15/2017 1236   RDW 16.1 (H) 10/15/2017 1236   RDW 14.4 09/25/2017 1420   RDW 14.9 (H) 03/29/2017 1048   LYMPHSABS 1.0 10/15/2017 1236   LYMPHSABS 1.2 09/25/2017 1420   LYMPHSABS 0.8 (L) 03/29/2017 1048   MONOABS 0.1 10/15/2017 1236   MONOABS 0.1 03/29/2017 1048   EOSABS 0.0 10/15/2017 1236   EOSABS 0.1 09/25/2017 1420   BASOSABS 0.0 10/15/2017 1236   BASOSABS 0.0 09/25/2017 1420   BASOSABS 0.0 03/29/2017 1048    BMET    Component Value Date/Time   NA 139 09/05/2017 0943   NA 139 03/29/2017 1048   K 4.3 09/05/2017 0943   K 3.9 03/29/2017 1048   CL 106 09/05/2017 0943   CL 105 07/07/2012 1327   CO2 25 09/05/2017 0943   CO2 23 03/29/2017 1048   GLUCOSE 118 09/05/2017 0943   GLUCOSE 117 03/29/2017 1048   GLUCOSE 104 (H) 07/07/2012 1327   BUN 14 09/05/2017 0943   BUN 16.3 03/29/2017 1048   CREATININE 0.77 09/05/2017 0943   CREATININE 0.9 03/29/2017 1048   CALCIUM 9.3 09/05/2017 0943   CALCIUM 8.9 03/29/2017 1048   GFRNONAA >60 09/05/2017 0943   GFRAA >60 09/05/2017 0943    BNP    Component Value Date/Time   BNP 20.4 09/25/2017 1420   BNP 29.8 08/27/2017 1927      ProBNP    Component Value Date/Time   PROBNP 14.0 09/12/2016 1644    Imaging: No results found.   Assessment & Plan:   No problem-specific Assessment & Plan notes found for this encounter.     Martyn Ehrich, NP 10/15/2017

## 2017-10-15 NOTE — Patient Instructions (Signed)

## 2017-10-15 NOTE — Telephone Encounter (Signed)
Gave patient avs and calendar of upcoming appts.  °

## 2017-10-16 LAB — CANCER ANTIGEN 27.29: CA 27.29: 231.5 U/mL — ABNORMAL HIGH (ref 0.0–38.6)

## 2017-10-18 ENCOUNTER — Ambulatory Visit (INDEPENDENT_AMBULATORY_CARE_PROVIDER_SITE_OTHER)
Admission: RE | Admit: 2017-10-18 | Discharge: 2017-10-18 | Disposition: A | Discharge status: Home / Self Care | Payer: Medicare Other | Source: Ambulatory Visit | Attending: Primary Care | Admitting: Primary Care

## 2017-10-18 ENCOUNTER — Ambulatory Visit (INDEPENDENT_AMBULATORY_CARE_PROVIDER_SITE_OTHER): Payer: Medicare Other | Admitting: Primary Care

## 2017-10-18 ENCOUNTER — Encounter: Payer: Self-pay | Admitting: Primary Care

## 2017-10-18 VITALS — BP 110/62 | HR 73 | Ht 67.0 in | Wt 267.8 lb

## 2017-10-18 DIAGNOSIS — J45901 Unspecified asthma with (acute) exacerbation: Secondary | ICD-10-CM | POA: Insufficient documentation

## 2017-10-18 DIAGNOSIS — J4531 Mild persistent asthma with (acute) exacerbation: Secondary | ICD-10-CM

## 2017-10-18 DIAGNOSIS — R059 Cough, unspecified: Secondary | ICD-10-CM

## 2017-10-18 DIAGNOSIS — G4733 Obstructive sleep apnea (adult) (pediatric): Secondary | ICD-10-CM | POA: Diagnosis not present

## 2017-10-18 DIAGNOSIS — R05 Cough: Secondary | ICD-10-CM

## 2017-10-18 LAB — POCT EXHALED NITRIC OXIDE: FeNO level (ppb): 28

## 2017-10-18 MED ORDER — FLUTICASONE FUROATE-VILANTEROL 100-25 MCG/INH IN AEPB: 1.0000 | INHALATION_SPRAY | Freq: Every day | RESPIRATORY_TRACT | 5 refills | Status: DC

## 2017-10-18 MED ORDER — BENZONATATE 100 MG PO CAPS: 100.0000 mg | ORAL_CAPSULE | Freq: Two times a day (BID) | ORAL | 0 refills | Status: DC | PRN

## 2017-10-18 MED ORDER — PREDNISONE 10 MG PO TABS: ORAL_TABLET | ORAL | 0 refills | Status: DC

## 2017-10-18 MED ORDER — FLUTICASONE FUROATE-VILANTEROL 100-25 MCG/INH IN AEPB: 1.0000 | INHALATION_SPRAY | Freq: Every day | RESPIRATORY_TRACT | Status: DC

## 2017-10-18 NOTE — Progress Notes (Signed)
Chart and office note reviewed in detail  > agree with a/p as outlined    

## 2017-10-18 NOTE — Patient Instructions (Addendum)
Take protonix everyday at night Delsym cough syrup twice a day (over counter) Tessalone perle as needed for cough  GERD diet    >> Stop flovent and Advair (this can aggravate cough) >> Replace with trial for Breo inhaler - 1 puff daily (let us know if this helps and we can send in RX) >> Sending RX for prednisone x5 days  >> Nebulizer as needed for wheeze/shortness of breath  >> CXR today

## 2017-10-18 NOTE — Progress Notes (Signed)
@Patient  ID: Michaela Little, female    DOB: February 17, 1961, 57 y.o.   MRN: [id number]  Chief Complaint  Patient presents with  . Acute Visit    cough for 6wks, fever,cough started 2 weeeks before surgery May 30th, wheezing and tight chest, just got nebulizer(has not set it up yet, just came yesterday)    Referring provider: Clent Demark, PA-C  HPI: 57 year old female, former smoker (quit in 1990). History of hypertension, asthma, OSA on cpap, rhinitis, GERD, breast cancer, left ventricular diastolic dysfunction with preserved systolic function. Asthma dx  around age 30, worse symptoms since 2004 eval in [facility] by pulmonary. Moved to [place] 10/2010 and referred to pulmonary clinic by Dr Bubba Camp 02/08/2011. Patient of Dr. Melvyn Novas, last seen in 2018 dyspnea and UACS. Positive RAST to mold and trees.  IgE 1082.  No longer using CPAP.   10/18/2017 Presents today for an acute visit with complaints of cough. Cough started 6 weeks ago. She was evaluated in ED with cc of fever and productive cough. BNP normal. CXR shows cardiomegaly. CTA without PE or PNA. Started on Levaquin. Cough has improved but still persistent. States that its dry. Some associated sob and chest tightness. She was started on advair 250/50 at some point and continued to use flovent.   Previous testing: - PFTs 04/27/2013 > wnl  - Chest CT a 07/03/16 No evidence of pulmonary emboli or other acute abnormality - 09/12/2016   Walked RA x one lap @ 185 stopped due to sob/ slow pace, no desat - Spirometry 09/12/2016  No sign obstruction on fev1/fvc and minimal curvature to f/v loop  - FENO 09/12/2016  =   22   - Allergy profile  09/12/16  >  Eos 0.1 /  IgE  1082 RAST multiple Pos > rec allergy eval  - PFT's  10/18/2016  FEV1 2.33  (103 % ) ratio 87  p 4  % improvement from saba p  nothing prior to study with DLCO  71 % corrects to 95  % for alv volume  And ERV 23 - ECHO 10/07/17: Left ventricle size and systolic function are normal. EF range of 60% to  65%  Allergies  Allergen Reactions  . Lisinopril-Hydrochlorothiazide Itching  . Adhesive [Tape] Itching and Other (See Comments)    Redness  . Fentanyl Other (See Comments)    GI upset and drowsiness  . Gabapentin Other (See Comments)    Auditory hallucinations  . Hctz [Hydrochlorothiazide] Itching  . Latex Itching  . Lisinopril Itching  . Losartan Potassium Other (See Comments)    Makes her feel "bad"   . Other Other (See Comments)    FRUIT FLAVORED TUMS=HIVES  . Oxycodone     GI upset, drowsy    Immunization History  Administered Date(s) Administered  . Influenza Split 12/25/2011  . Influenza,inj,Quad PF,6+ Mos 03/25/2013, 12/25/2013, 11/21/2015, 01/17/2017  . Pneumococcal Polysaccharide-23 03/06/2011    Past Medical History:  Diagnosis Date  . Anxiety   . Arthritis    hands, knees, hips  . Asthma   . Bronchitis   . Cancer ([place])   . Cervical stenosis (uterine cervix)   . Disorder of appendix    Enlarged  . Dyspnea on exertion   . GERD (gastroesophageal reflux disease)   . Headache(784.0)   . History of breast cancer    2010--  LEFT  s/p  mastectomy (in [place]) AND CHEMORADIATION--  NO RECURRENCE  . History of cervical dysplasia   . Hyperlipidemia   .  Hypertension   . Malignant neoplasm of overlapping sites of left breast in female, estrogen receptor positive ([place]) 03/05/2013  . OSA (obstructive sleep apnea) moderate osa per study  09/2010   CPAP  , NOT USING ON REGULAR BASIS  . Pelvic pain in female   . Positive H. pylori test    08-05-2013  . Positive TB test    AS TEEN--  [place] W/ MEDS  . Refusal of blood transfusions as patient is Jehovah's Witness   . Seasonal allergies   . Uterine fibroid   . Wears glasses     Tobacco History: Social History   Tobacco Use  Smoking Status Former Smoker  . Packs/day: 0.30  . Years: 10.00  . Pack years: 3.00  . Types: Cigarettes  . Last attempt to quit: 03/26/1988  . Years since quitting: 29.5  Smokeless Tobacco  Never Used   Counseling given: Not Answered   Outpatient Medications Prior to Visit  Medication Sig Dispense Refill  . albuterol (PROVENTIL HFA;VENTOLIN HFA) 108 (90 Base) MCG/ACT inhaler Inhale 2 puffs into the lungs every 6 (six) hours as needed for wheezing or shortness of breath.    Marland Kitchen amLODipine (NORVASC) 10 MG tablet Take 1 tablet (10 mg total) by mouth daily. 90 tablet 3  . carvedilol (COREG) 6.25 MG tablet Take 1 tablet (6.25 mg total) by mouth 2 (two) times daily with a meal. (Patient taking differently: Take 12.5 mg by mouth daily. ) 180 tablet 3  . exemestane (AROMASIN) 25 MG tablet Take 1 tablet (25 mg total) by mouth daily after breakfast. 90 tablet 1  . ipratropium-albuterol (DUONEB) 0.5-2.5 (3) MG/3ML SOLN Take 3 mLs by nebulization every 6 (six) hours as needed. 360 mL 5  . loratadine (CLARITIN) 10 MG tablet Take 20 mg by mouth daily.     . montelukast (SINGULAIR) 10 MG tablet Take 1 tablet (10 mg total) by mouth at bedtime. 30 tablet 5  . naproxen (NAPROSYN) 500 MG tablet Take 1 tablet (500 mg total) by mouth 2 (two) times daily with a meal. 30 tablet 0  . palbociclib (IBRANCE) 75 MG capsule TAKE 1 CAPSULE (75MG ) BY MOUTH DAILY. TAKE WHOLE WITH FOOD. 21 capsule 2  . pantoprazole (PROTONIX) 40 MG tablet Take 1 tablet (40 mg total) by mouth daily. Take 30-60 min before first meal of the day 30 tablet 2  . promethazine (PHENERGAN) 25 MG tablet Take 1 tablet (25 mg total) by mouth every 6 (six) hours as needed for nausea or vomiting. 30 tablet 0  . ranitidine (ZANTAC) 300 MG tablet Take 1 tablet (300 mg total) by mouth at bedtime. (Patient taking differently: Take 300 mg by mouth at bedtime as needed (for indigestion.). ) 30 tablet 5  . traMADol (ULTRAM) 50 MG tablet Take 1-2 tablets (50-100 mg total) by mouth every 6 (six) hours as needed for moderate pain or severe pain. 30 tablet 0  . benzonatate (TESSALON) 100 MG capsule Take 1 capsule (100 mg total) by mouth 2 (two) times daily  as needed for cough. 30 capsule 0  . fluticasone (FLOVENT HFA) 110 MCG/ACT inhaler Inhale 2 puffs into the lungs 2 (two) times daily. With spacer (Patient taking differently: Inhale 2 puffs into the lungs 2 (two) times daily as needed (for respiratory issues.). With spacer) 1 Inhaler 5  . Fluticasone-Salmeterol (ADVAIR DISKUS) 250-50 MCG/DOSE AEPB Inhale 1 puff into the lungs 2 (two) times daily. 1 each 5  . levofloxacin (LEVAQUIN) 500 MG tablet Take 1  tablet (500 mg total) by mouth daily. 7 tablet 0  . tobramycin (TOBREX) 0.3 % ophthalmic solution Place 2 drops into both eyes every 4 (four) hours. 5 mL 0  . furosemide (LASIX) 20 MG tablet Take 1 tablet (20 mg total) by mouth daily as needed (for fluid retention.). (Patient not taking: Reported on 10/18/2017) 30 tablet 5   No facility-administered medications prior to visit.     Review of Systems  Review of Systems  Constitutional: Negative.   HENT: Negative.   Respiratory: Positive for cough, chest tightness and shortness of breath.   Cardiovascular: Negative.   Skin: Negative.     Physical Exam  BP 110/62 (BP Location: Right Arm, Cuff Size: Large)   Pulse 73   Ht 5\' 7"  (1.702 m)   Wt 267 lb 12.8 oz (121.5 kg)   LMP 02/25/2009   SpO2 96%   BMI 41.94 kg/m  Physical Exam  Constitutional: She is oriented to person, place, and time. She appears well-developed and well-nourished.  Obese female, no acute distress  Eyes: Pupils are equal, round, and reactive to light. EOM are normal.  Neck: Normal range of motion. Neck supple.  Cardiovascular: Normal rate and regular rhythm.  Pulmonary/Chest: Effort normal and breath sounds normal.  Neurological: She is alert and oriented to person, place, and time.  Skin: Skin is dry.  Psychiatric: She has a normal mood and affect. Her behavior is normal. Judgment and thought content normal.     Lab Results:  CBC    Component Value Date/Time   WBC 1.9 (L) 10/15/2017 1236   RBC 3.60 (L)  10/15/2017 1236   HGB 9.7 (L) 10/15/2017 1236   HGB 10.3 (L) 09/25/2017 1420   HGB 11.8 03/29/2017 1048   HCT 29.3 (L) 10/15/2017 1236   HCT 32.3 (L) 09/25/2017 1420   HCT 36.0 03/29/2017 1048   PLT 144 (L) 10/15/2017 1236   PLT 271 09/25/2017 1420   MCV 81.4 10/15/2017 1236   MCV 84 09/25/2017 1420   MCV 91.0 03/29/2017 1048   MCH 26.9 10/15/2017 1236   MCHC 33.1 10/15/2017 1236   RDW 16.1 (H) 10/15/2017 1236   RDW 14.4 09/25/2017 1420   RDW 14.9 (H) 03/29/2017 1048   LYMPHSABS 1.0 10/15/2017 1236   LYMPHSABS 1.2 09/25/2017 1420   LYMPHSABS 0.8 (L) 03/29/2017 1048   MONOABS 0.1 10/15/2017 1236   MONOABS 0.1 03/29/2017 1048   EOSABS 0.0 10/15/2017 1236   EOSABS 0.1 09/25/2017 1420   BASOSABS 0.0 10/15/2017 1236   BASOSABS 0.0 09/25/2017 1420   BASOSABS 0.0 03/29/2017 1048    BMET    Component Value Date/Time   NA 141 10/15/2017 1236   NA 139 03/29/2017 1048   K 4.0 10/15/2017 1236   K 3.9 03/29/2017 1048   CL 107 10/15/2017 1236   CL 105 07/07/2012 1327   CO2 25 10/15/2017 1236   CO2 23 03/29/2017 1048   GLUCOSE 105 (H) 10/15/2017 1236   GLUCOSE 117 03/29/2017 1048   GLUCOSE 104 (H) 07/07/2012 1327   BUN 14 10/15/2017 1236   BUN 16.3 03/29/2017 1048   CREATININE 0.87 10/15/2017 1236   CREATININE 0.9 03/29/2017 1048   CALCIUM 9.8 10/15/2017 1236   CALCIUM 8.9 03/29/2017 1048   GFRNONAA >60 10/15/2017 1236   GFRAA >60 10/15/2017 1236    BNP    Component Value Date/Time   BNP 20.4 09/25/2017 1420   BNP 29.8 08/27/2017 1927    ProBNP    Component  Value Date/Time   PROBNP 14.0 09/12/2016 1644    Imaging: Dg Chest 2 View  Result Date: 10/18/2017 CLINICAL DATA:  Cough congestion shortness of breath for 3 months. EXAM: CHEST - 2 VIEW COMPARISON:  08/27/2017 FINDINGS: Cardiac silhouette is normal in size. No mediastinal or hilar masses or evidence of adenopathy. Lungs are clear.  No pleural effusion or pneumothorax. Stable changes from a left mastectomy.  Skeletal structures are intact. IMPRESSION: No active cardiopulmonary disease. Electronically Signed   By: Lajean Manes M.D.   On: 10/18/2017 15:37     Assessment & Plan:   OSA (obstructive sleep apnea) -Needs CPAP supplies -Patient has old machine, unable to download airview (patient will need to bring in SD card at next visit or she could be eligible for new machine)  Asthma exacerbation Recent bronchitis, treated with Levaquin.  Cough improved but persistent, dry non-productive. Associated shortness of breath and chest tightness, relief with nebulizer.   -FENO 28/Mild restriction on spirometry -5-day course of oral steroid, delsym OTC and tessalon perles -Patient was taking Advair and Flovent, high dose of ICS can aggravate cough -Trial of Brio Ellipta -Neb as needed -CXR normal      Martyn Ehrich, NP 10/18/2017

## 2017-10-18 NOTE — Assessment & Plan Note (Addendum)
Recent bronchitis, treated with Levaquin.  Cough improved but persistent, dry non-productive. Associated shortness of breath and chest tightness, relief with nebulizer.   -FENO 28/Mild restriction on spirometry -5-day course of oral steroid, delsym OTC and tessalon perles -Patient was taking Advair and Flovent, high dose of ICS can aggravate cough -Trial of Breo Ellipta -Neb as needed -CXR normal

## 2017-10-18 NOTE — Assessment & Plan Note (Addendum)
-  Needs CPAP supplies -Patient has old machine, unable to download Michaela Little (patient will need to bring in SD card at next visit or she could be eligible for new machine)

## 2017-10-28 ENCOUNTER — Ambulatory Visit (INDEPENDENT_AMBULATORY_CARE_PROVIDER_SITE_OTHER): Payer: Medicare Other | Admitting: Physician Assistant

## 2017-11-06 ENCOUNTER — Other Ambulatory Visit: Payer: Self-pay | Admitting: Hematology

## 2017-11-06 DIAGNOSIS — Z170 Estrogen receptor positive status [ER+]: Principal | ICD-10-CM

## 2017-11-06 DIAGNOSIS — C50812 Malignant neoplasm of overlapping sites of left female breast: Secondary | ICD-10-CM

## 2017-11-07 ENCOUNTER — Other Ambulatory Visit: Payer: Self-pay | Admitting: Hematology

## 2017-11-07 DIAGNOSIS — Z170 Estrogen receptor positive status [ER+]: Principal | ICD-10-CM

## 2017-11-07 DIAGNOSIS — C50812 Malignant neoplasm of overlapping sites of left female breast: Secondary | ICD-10-CM

## 2017-11-07 MED ORDER — PROMETHAZINE HCL 25 MG PO TABS: 25.0000 mg | ORAL_TABLET | Freq: Four times a day (QID) | ORAL | 0 refills | Status: DC | PRN

## 2017-11-18 ENCOUNTER — Inpatient Hospital Stay (HOSPITAL_BASED_OUTPATIENT_CLINIC_OR_DEPARTMENT_OTHER): Payer: Medicare Other | Admitting: Hematology

## 2017-11-18 ENCOUNTER — Inpatient Hospital Stay: Payer: Medicare Other

## 2017-11-18 ENCOUNTER — Inpatient Hospital Stay: Payer: Medicare Other | Attending: Hematology

## 2017-11-18 VITALS — BP 139/74 | HR 78 | Temp 98.2°F | Resp 18 | Ht 67.0 in | Wt 258.6 lb

## 2017-11-18 DIAGNOSIS — I10 Essential (primary) hypertension: Secondary | ICD-10-CM | POA: Diagnosis not present

## 2017-11-18 DIAGNOSIS — C785 Secondary malignant neoplasm of large intestine and rectum: Secondary | ICD-10-CM | POA: Diagnosis not present

## 2017-11-18 DIAGNOSIS — R609 Edema, unspecified: Secondary | ICD-10-CM | POA: Diagnosis not present

## 2017-11-18 DIAGNOSIS — K5900 Constipation, unspecified: Secondary | ICD-10-CM

## 2017-11-18 DIAGNOSIS — C50912 Malignant neoplasm of unspecified site of left female breast: Secondary | ICD-10-CM

## 2017-11-18 DIAGNOSIS — C7951 Secondary malignant neoplasm of bone: Secondary | ICD-10-CM

## 2017-11-18 DIAGNOSIS — R109 Unspecified abdominal pain: Secondary | ICD-10-CM | POA: Insufficient documentation

## 2017-11-18 DIAGNOSIS — C50812 Malignant neoplasm of overlapping sites of left female breast: Secondary | ICD-10-CM | POA: Insufficient documentation

## 2017-11-18 DIAGNOSIS — Z7189 Other specified counseling: Secondary | ICD-10-CM

## 2017-11-18 DIAGNOSIS — Z170 Estrogen receptor positive status [ER+]: Secondary | ICD-10-CM

## 2017-11-18 LAB — CBC WITH DIFFERENTIAL/PLATELET
Basophils Absolute: 0 10*3/uL (ref 0.0–0.1)
Basophils Relative: 2 %
EOS ABS: 0 10*3/uL (ref 0.0–0.5)
EOS PCT: 1 %
HEMATOCRIT: 23.8 % — AB (ref 34.8–46.6)
HEMOGLOBIN: 7.9 g/dL — AB (ref 11.6–15.9)
LYMPHS ABS: 1.1 10*3/uL (ref 0.9–3.3)
Lymphocytes Relative: 66 %
MCH: 26.7 pg (ref 25.1–34.0)
MCHC: 33.2 g/dL (ref 31.5–36.0)
MCV: 80.4 fL (ref 79.5–101.0)
MONOS PCT: 1 %
Monocytes Absolute: 0 10*3/uL — ABNORMAL LOW (ref 0.1–0.9)
Neutro Abs: 0.5 10*3/uL — ABNORMAL LOW (ref 1.5–6.5)
Neutrophils Relative %: 30 %
Platelets: 99 10*3/uL — ABNORMAL LOW (ref 145–400)
RBC: 2.96 MIL/uL — ABNORMAL LOW (ref 3.70–5.45)
RDW: 17.7 % — AB (ref 11.2–14.5)
WBC: 1.6 10*3/uL — ABNORMAL LOW (ref 3.9–10.3)
nRBC: 10 /100 WBC — ABNORMAL HIGH

## 2017-11-18 LAB — COMPREHENSIVE METABOLIC PANEL
ALBUMIN: 3.8 g/dL (ref 3.5–5.0)
ALT: 46 U/L — AB (ref 0–44)
AST: 50 U/L — AB (ref 15–41)
Alkaline Phosphatase: 138 U/L — ABNORMAL HIGH (ref 38–126)
Anion gap: 8 (ref 5–15)
BUN: 16 mg/dL (ref 6–20)
CHLORIDE: 108 mmol/L (ref 98–111)
CO2: 25 mmol/L (ref 22–32)
CREATININE: 0.93 mg/dL (ref 0.44–1.00)
Calcium: 9.9 mg/dL (ref 8.9–10.3)
GFR calc Af Amer: >60 mL/min (ref >60)
GFR calc non Af Amer: >60 mL/min (ref >60)
Glucose, Bld: 112 mg/dL — ABNORMAL HIGH (ref 70–99)
Potassium: 3.9 mmol/L (ref 3.5–5.1)
SODIUM: 141 mmol/L (ref 135–145)
Total Bilirubin: 1.2 mg/dL (ref 0.3–1.2)
Total Protein: 7.7 g/dL (ref 6.5–8.1)

## 2017-11-18 MED ORDER — DENOSUMAB 120 MG/1.7ML ~~LOC~~ SOLN
SUBCUTANEOUS | Status: AC
  Filled 2017-11-18: qty 1.7

## 2017-11-18 MED ORDER — DENOSUMAB 120 MG/1.7ML ~~LOC~~ SOLN
120.0000 mg | Freq: Once | SUBCUTANEOUS | Status: AC
  Administered 2017-11-18: 120 mg via SUBCUTANEOUS

## 2017-11-18 MED ORDER — EXEMESTANE 25 MG PO TABS: 25.0000 mg | ORAL_TABLET | Freq: Every day | ORAL | 1 refills | Status: DC

## 2017-11-18 NOTE — Progress Notes (Signed)
[name]  [phone] [phone]  Clinic Follow up Note   Patient Care Team: Michaela Little as PCP - General (Physician Assistant) Michaela Rockers, MD as Consulting Physician (Pulmonary Disease) Michaela Merle, MD as Consulting Physician (Hematology)   Date of Service:  11/18/2017   CHIEF COMPLAINT: F/U metastatic breast cancer   SUMMARY OF ONCOLOGIC HISTORY: Oncology History   Cancer Staging Malignant neoplasm of overlapping sites of left breast in female, estrogen receptor positive ([place]) Staging form: Breast, AJCC 7th Edition - Clinical: Stage IIIA (T2, N2, M0) - Unsigned       Malignant neoplasm of overlapping sites of left breast in female, estrogen receptor positive ([place])   05/2008 Cancer Diagnosis    Left sided breast cancer (no path report available)    11/2008 Pathologic Stage    Stage IIIA: T2 N2     Neo-Adjuvant Chemotherapy    Paclitaxel weekly x 12; doxorubicin and cyclophosphamide x 4 - both given with trifiparnib (treated at [facility] in [place])    11/2008 Definitive Surgery    Left modified radiation mastectomy: invasive lobular carcinoma, ER+, PR+, HER2/neu negative, 5/22 LN positive     - 03/2009 Radiation Therapy    Adjuvant radiation to left chest wall    2011 - 08/2014 Anti-estrogen oral therapy    Tamoxifen 20 mg used briefly; discontinued due to uterine lining concerns; changed to anastrozole 1 mg daily (began 07/2100); discontinued by patient summer of 2016    05/26/2015 Progression    Iliac bone biopsy showed metastatic carcinoma consistent with a breast primary, ER positive, PR and HER-2 negative. Her staging CT, and a PET scan was negative for visceral metastasis.    05/2015 -  Anti-estrogen oral therapy    1. letrozole and Ibrance started March 2017             -Ibrance dose decreased to 75 mg daily  -Letrozole switched to Exemestane on 02/13/17 due to b/l rib pain.     11/16/2015 Miscellaneous   Xgeva monthly for bone metastasis     07/03/2016 Imaging    CT chest, abdomen and pelvis with contrast showed no evidence of metastasis.     02/07/2017 Imaging    CT AP W Contrast 02/07/17 IMPRESSION: 1. No CT findings for abdominal/pelvic metastatic disease. 2. No acute abdominal findings, mass lesions or adenopathy. 3. Status post cholecystectomy with mild associated common bile duct dilatation. 4. Somewhat thickened appendix appears relatively stable. No acute inflammatory process. 5. Stable mixed lytic and sclerotic osseous metastatic disease.     02/07/2017 Imaging    Bone Scan Whole Body 02/07/17 IMPRESSION: No scintigraphic evidence skeletal metastasis     08/12/2017 Imaging    Whole Boday Scan 08/12/17 IMPRESSION: Scattered degenerative type uptake as above with a questionable focus of increased tracer localization at L2 vertebral body versus artifact; no abnormality is seen at this site by CT. Consider either characterization by MR or attention on follow-up imaging.    08/12/2017 Imaging    CT AP W Contrast 08/12/17  IMPRESSION: Continued thickening of the appendix is noted without surrounding inflammation. It has maximum measured diameter is decreased compared to prior exam. There is no evidence of acute inflammation. Stable mixed lytic and sclerotic appearance of visualized skeleton is noted consistent with history of known osseous metastases. No acute abnormality seen in the abdomen or pelvis.    08/22/2017 Surgery    APPENDECTOMY LAPAROSCOPIC ERAS PATHWAY by Dr. Malcolm Little and REMOVAL  OF LEFT TISSUE EXPANDER by Dr. Harlow Little 08/22/17    08/22/2017 Pathology Results    Diagnosis 08/22/17  1. Appendix, Other than Incidental - METASTATIC CARCINOMA, CONSISTENT WITH BREAST PRIMARY. - CARCINOMA IS PRESENT AT THE SURGICAL RESECTION MARGIN AS WELL AS THE SEROSAL SURFACE. - LYMPHOVASCULAR INVASION IS IDENTIFIED. - SEE COMMENT. 2. Breast, capsule, Left - DENSE FIBROUS  TISSUE WITH MILD INFLAMMATION, INCLUDING A FEW SCATTERED MULTINUCLEATED GIANT CELLS. - THERE IS NO EVIDENCE OF MALIGNANCY.    08/27/2017 Imaging    CT AP W Contrast 08/27/17 IMPRESSION: No evidence of pulmonary embolus. Small pericardial effusion with uncertain clinical significance. No evidence of acute abnormalities within the chest, abdomen or pelvis. Area of fat stranding and small focus of gas within the anterior abdominal wall, immediately inferior to the umbilicus, probably at the site of laparoscopic access.    10/07/2017 Echocardiogram    10/07/2017 ECO Study Conclusions  - Procedure narrative: Transthoracic echocardiography. Image   quality was adequate. The study was technically difficult, as a   result of poor acoustic windows. - Left ventricle: The cavity size was normal. Wall thickness was   normal. Systolic function was normal. The estimated ejection   fraction was in the range of 60% to 65%. Wall motion was normal;   there were no regional wall motion abnormalities. Features are   consistent with a pseudonormal left ventricular filling pattern,   with concomitant abnormal relaxation and increased filling   pressure (grade 2 diastolic dysfunction).     INTERIM HISTORY: 01/17/17 Michaela Little is here for a follow up of her metastatic breast cancer. I am taking over her care from Michaela Little. This is my first time seeing her.   Today she is on her off week of Ibrance and is on 23m. She notes she feels exhausted and is often SOB. She has been having right sided abdominal pain that was worse 2 weeks ago, she say symptom management clinic and ER. She has been having ongoing pain in the right groin area but worsened lately. She is also having pain around her left lower ribs. She has not tried gabapentin and she takes tramadol as needed for pain. Right now her pain is tolerable. Sometime her pain can be past 10/10 but now the pain is 6-7/10 She is taking her BP medication  for her HTN, she started Flovent due to her current trouble breathing, given by Michaela Little Her legs swell but no doppler has been done before. She also gets a lot of fluid in her stomach. She is also on Lasix, given by her PCP, Dr. GAltamease Oiler She has arthritis. She still has tissue expander in left breast where she has some pain and tenderness. Her PCP changed her heart medication because her heart rate was slow when he last saw her. She would like to get to the bottom of her issues as she has difficulty falling asleep at night.  [place] by herself in a community residence[place] She has 5 children. She does not drive. She does have SCAT forms to be filled out.    CURRENT TREATMENT:    1. letrozole and Ibrance started March 2017   -Ibrance dose decreased to 75 mg daily, held on 06/03/2017 due to surgery, restarted around 09/19/17   -Letrozole switched to Exemestane on 02/13/17 due to b/l rib pain.  2. Xgeva started 11/16/2015, repeated monthly   INTERVAL HISTORY:  Michaela Little is here for a follow up of her metastatic breast cancer. She presented to the clinic by herself. She is complaining of bloating, constipation, and abdominal pain. She has lost weight almost 9 Ibs after surgery and is still loosing weight. She is trying to cut down on sugary foods. She experiences shooting pain at site of surgery and on her RUQ and flank.   She is off Ibrance this week.      REVIEW OF SYSTEMS:  Constitutional: Denies fevers, chills or abnormal weight loss (+) uses walker Eyes: Denies blurriness of vision Ears, nose, mouth, throat, and face: Denies mucositis, sore throat  Respiratory: Denies cough or wheezes   Cardiovascular: Denies palpitation, chest discomfort (+) Slight lower leg edema Gastrointestinal:  Denies nausea, heartburn  (+) shooting RUQ abdominal pain and flank pain, constipation, and bloating MSK: (+) back pain,  stable Breast: (+) she feels a lump on her right lower midaxillary area Skin: Denies abnormal skin rashes Lymphatics: Denies new lymphadenopathy or easy bruising Neurological:Denies numbness, tingling or new weaknesses Behavioral/Psych: Mood is stable, no new changes  All other systems were reviewed with the patient and are negative.  MEDICAL HISTORY:  Past Medical History:  Diagnosis Date  . Anxiety   . Arthritis    hands, knees, hips  . Asthma   . Bronchitis   . Cancer ([place])   . Cervical stenosis (uterine cervix)   . Disorder of appendix    Enlarged  . Dyspnea on exertion   . GERD (gastroesophageal reflux disease)   . Headache(784.0)   . History of breast cancer    2010--  LEFT  s/p  mastectomy (in [place]) AND CHEMORADIATION--  NO RECURRENCE  . History of cervical dysplasia   . Hyperlipidemia   . Hypertension   . Malignant neoplasm of overlapping sites of left breast in female, estrogen receptor positive ([place]) 03/05/2013  . OSA (obstructive sleep apnea) moderate osa per study  09/2010   CPAP  , NOT USING ON REGULAR BASIS  . Pelvic pain in female   . Positive H. pylori test    08-05-2013  . Positive TB test    AS TEEN--  [place] W/ MEDS  . Refusal of blood transfusions as patient is Jehovah's Witness   . Seasonal allergies   . Uterine fibroid   . Wears glasses     SURGICAL HISTORY: Past Surgical History:  Procedure Laterality Date  . APPENDECTOMY  y  . CARDIOVASCULAR STRESS TEST  10-29-2012   low risk perfusion study/  no significant reversibity/ ef 66%/  normal wall motion  . CERVICAL CONIZATION W/BX  2012   in  [place] N/A 05/14/2012   Procedure: LAPAROSCOPIC CHOLECYSTECTOMY;  Surgeon: Ralene Ok, MD;  Location: [facility];  Service: General;  Laterality: N/A;  . COLONOSCOPY  2011   normal per patient - [place]  . DILATION AND CURETTAGE OF UTERUS  10/15/2011   Procedure: DILATATION AND CURETTAGE;  Surgeon: Melina Schools, MD;  Location: [facility];  Service:  Gynecology;  Laterality: N/A;  Conization &  endocervical curettings  . EXAMINATION UNDER ANESTHESIA N/A 08/20/2013   Procedure: EXAM UNDER ANESTHESIA;  Surgeon: Margarette Asal, MD;  Location: [facility];  Service: Gynecology;  Laterality: N/A;  . [place]   right  . LAPAROSCOPIC APPENDECTOMY N/A 08/22/2017   Procedure: [place];  Surgeon: Coralie Keens, MD;  Location: [facility];  Service: General;  Laterality: N/A;  .  LAPAROSCOPIC ASSISTED VAGINAL HYSTERECTOMY N/A 10/26/2014   Procedure: HYSTERECTOMY ABDOMINAL ;  Surgeon: Molli Posey, MD;  Location: [facility];  Service: Gynecology;  Laterality: N/A;  . MASTECTOMY Left 11/2008  in [place]   . SALPINGOOPHORECTOMY Bilateral 10/26/2014   Procedure: BILATERAL SALPINGO OOPHORECTOMY;  Surgeon: Molli Posey, MD;  Location: [facility];  Service: Gynecology;  Laterality: Bilateral;  . TISSUE EXPANDER PLACEMENT Left 08/22/2017   Procedure: REMOVAL OF LEFT TISSUE EXPANDER;  Surgeon: Crissie Reese, MD;  Location: [facility];  Service: Plastics;  Laterality: Left;  . TISSUE EXPANDER REMOVAL Left 08/22/2017  . TRANSTHORACIC ECHOCARDIOGRAM  10-29-2012   mild lvh/  ef 60-65%/  grade II diastolic dysfunction/  trivial mr  &  tr    I have reviewed the social history and family history with the patient and they are unchanged from previous note.  ALLERGIES:  is allergic to lisinopril-hydrochlorothiazide; adhesive [tape]; fentanyl; gabapentin; hctz [hydrochlorothiazide]; latex; lisinopril; losartan potassium; other; and oxycodone.  MEDICATIONS:  Current Outpatient Medications  Medication Sig Dispense Refill  . albuterol (PROVENTIL HFA;VENTOLIN HFA) 108 (90 Base) MCG/ACT inhaler Inhale 2 puffs into the lungs every 6 (six) hours as needed for wheezing or shortness of breath.    Marland Kitchen amLODipine (NORVASC) 10 MG tablet Take 1 tablet (10 mg total) by mouth daily. 90 tablet 3  . benzonatate (TESSALON) 100  MG capsule Take 1 capsule (100 mg total) by mouth 2 (two) times daily as needed for cough. 30 capsule 0  . carvedilol (COREG) 6.25 MG tablet Take 1 tablet (6.25 mg total) by mouth 2 (two) times daily with a meal. (Patient taking differently: Take 12.5 mg by mouth daily. ) 180 tablet 3  . exemestane (AROMASIN) 25 MG tablet Take 1 tablet (25 mg total) by mouth daily after breakfast. 90 tablet 1  . fluticasone furoate-vilanterol (BREO ELLIPTA) 100-25 MCG/INH AEPB Inhale 1 puff into the lungs daily. 28 each 5  . furosemide (LASIX) 20 MG tablet Take 1 tablet (20 mg total) by mouth daily as needed (for fluid retention.). 30 tablet 5  . ipratropium-albuterol (DUONEB) 0.5-2.5 (3) MG/3ML SOLN Take 3 mLs by nebulization every 6 (six) hours as needed. 360 mL 5  . loratadine (CLARITIN) 10 MG tablet Take 20 mg by mouth daily.     . montelukast (SINGULAIR) 10 MG tablet Take 1 tablet (10 mg total) by mouth at bedtime. 30 tablet 5  . naproxen (NAPROSYN) 500 MG tablet Take 1 tablet (500 mg total) by mouth 2 (two) times daily with a meal. 30 tablet 0  . palbociclib (IBRANCE) 75 MG capsule TAKE 1 CAPSULE (75MG) BY MOUTH DAILY. TAKE WHOLE WITH FOOD. 21 capsule 2  . pantoprazole (PROTONIX) 40 MG tablet Take 1 tablet (40 mg total) by mouth daily. Take 30-60 min before first meal of the day 30 tablet 2  . predniSONE (DELTASONE) 10 MG tablet 2 tabs daily x 5 days 10 tablet 0  . promethazine (PHENERGAN) 25 MG tablet Take 1 tablet (25 mg total) by mouth every 6 (six) hours as needed for nausea or vomiting. 30 tablet 0  . ranitidine (ZANTAC) 300 MG tablet Take 1 tablet (300 mg total) by mouth at bedtime. (Patient taking differently: Take 300 mg by mouth at bedtime as needed (for indigestion.). ) 30 tablet 5  . traMADol (ULTRAM) 50 MG tablet Take 1-2 tablets (50-100 mg total) by mouth every 6 (six) hours as needed for moderate pain or severe pain. 30 tablet 0  No current facility-administered medications for this visit.      PHYSICAL EXAMINATION:  ECOG PERFORMANCE STATUS: 2-3  Vitals:   11/18/17 1339  BP: 139/74  Pulse: 78  Resp: 18  Temp: 98.2 F (36.8 C)  SpO2: 100%   Filed Weights   11/18/17 1339  Weight: 258 lb 9.6 oz (117.3 kg)    GENERAL:alert, no distress and comfortable (+) obese, uses walker SKIN: skin color, texture, turgor are normal, no rashes or significant lesions EYES: normal, Conjunctiva are pink and non-injected, sclera clear OROPHARYNX:no exudate, no erythema and lips, buccal mucosa, and tongue normal  NECK: supple, thyroid normal size, non-tender, (+)palpable right thyroid nodule  LYMPH:  no palpable lymphadenopathy in the cervical, axillary or inguinal LUNGS: clear to auscultation and percussion with normal breathing effort HEART: regular rate & rhythm and no murmurs and no lower extremity edema ABDOMEN:abdomen soft, non-tender and normal bowel sounds (+) S/p Appendectomy: 3 laparoscopic surgical incision across lower abdomen are healing well.    Musculoskeletal:no cyanosis of digits and no clubbing  NEURO: alert & oriented x 3 with fluent speech, no focal motor/sensory deficits Breast: (+) S/p left mastectomy and recent implant removal, surgical incision is healing well, no discharge or skin erythema, drainage tube removed. no palpable mass or adenopathy. right breast exam negative    LABORATORY DATA:  I have reviewed the data as listed CBC Latest Ref Rng & Units 11/18/2017 10/15/2017 09/25/2017  WBC 3.9 - 10.3 K/uL 1.6(L) 1.9(L) 3.7  Hemoglobin 11.6 - 15.9 g/dL 7.9(L) 9.7(L) 10.3(L)  Hematocrit 34.8 - 46.6 % 23.8(L) 29.3(L) 32.3(L)  Platelets 145 - 400 K/uL 99(L) 144(L) 271     CMP Latest Ref Rng & Units 11/18/2017 10/15/2017 09/05/2017  Glucose 70 - 99 mg/dL 112(H) 105(H) 118  BUN 6 - 20 mg/dL 16 14 14   Creatinine 0.44 - 1.00 mg/dL 0.93 0.87 0.77  Sodium 135 - 145 mmol/L 141 141 139  Potassium 3.5 - 5.1 mmol/L 3.9 4.0 4.3  Chloride 98 - 111 mmol/L 108 107 106  CO2 22  - 32 mmol/L 25 25 25   Calcium 8.9 - 10.3 mg/dL 9.9 9.8 9.3  Total Protein 6.5 - 8.1 g/dL 7.7 7.4 7.6  Total Bilirubin 0.3 - 1.2 mg/dL 1.2 1.2 0.8  Alkaline Phos 38 - 126 U/L 138(H) 128(H) 147  AST 15 - 41 U/L 50(H) 24 25  ALT 0 - 44 U/L 46(H) 11 14    PATHOLOGY   Diagnosis 08/22/17  1. Appendix, Other than Incidental - METASTATIC CARCINOMA, CONSISTENT WITH BREAST PRIMARY. - CARCINOMA IS PRESENT AT THE SURGICAL RESECTION MARGIN AS WELL AS THE SEROSAL SURFACE. - LYMPHOVASCULAR INVASION IS IDENTIFIED. - SEE COMMENT. 2. Breast, capsule, Left - DENSE FIBROUS TISSUE WITH MILD INFLAMMATION, INCLUDING A FEW SCATTERED MULTINUCLEATED GIANT CELLS. - THERE IS NO EVIDENCE OF MALIGNANCY. Microscopic Comment 1. Grossly, the appendiceal wall is thickened. Histologic evaluation reveals diffuse involvement by a malignant process, consisting of small relatively uniform discohesive tumor cells involving the submucosa as well as the periappendiceal soft tissue, including the serosal surface. Immunohistochemical stains were performed revealing that the tumor cells are positive for cytokeratin 7, GATA-3, estrogen receptor and focally positive for GCDFP. Tumor cells are negative for CD56, cytokeratin 20, chromogranin and synaptophysin. A Ki-67 stain is positive in approximately 70% of the tumor cells. Overall, the findings are consistent with metastatic carcinoma of breast primary and may be a lobular phenotype. A complete breast prognostic profile will be performed and the results reported separately.  Dr. Vicente Males has reviewed selected slides and concurs with this interpretation. (JBK:kh 08-25-17)    PROCEDURES 10/07/2017 ECO Study Conclusions  - Procedure narrative: Transthoracic echocardiography. Image   quality was adequate. The study was technically difficult, as a   result of poor acoustic windows. - Left ventricle: The cavity size was normal. Wall thickness was   normal. Systolic function  was normal. The estimated ejection   fraction was in the range of 60% to 65%. Wall motion was normal;   there were no regional wall motion abnormalities. Features are   consistent with a pseudonormal left ventricular filling pattern,   with concomitant abnormal relaxation and increased filling   pressure (grade 2 diastolic dysfunction).  ECHO 01/22/17 Study Conclusions - Left ventricle: The cavity size was normal. There was moderate concentric hypertrophy. Systolic function was normal. The estimated ejection fraction was in the range of 55% to 60%. Wall motion was normal; there were no regional wall motion abnormalities. Doppler parameters are consistent with abnormal left ventricular relaxation (grade 1 diastolic dysfunction).   RADIOGRAPHIC STUDIES: I have personally reviewed the radiological images as listed and agreed with the findings in the report. No results found.    CT AP W Contrast 08/27/17 IMPRESSION: No evidence of pulmonary embolus. Small pericardial effusion with uncertain clinical significance. No evidence of acute abnormalities within the chest, abdomen or pelvis. Area of fat stranding and small focus of gas within the anterior abdominal wall, immediately inferior to the umbilicus, probably at the site of laparoscopic access.   Whole Body Scan 08/12/17 IMPRESSION: Scattered degenerative type uptake as above with a questionable focus of increased tracer localization at L2 vertebral body versus artifact; no abnormality is seen at this site by CT. Consider either characterization by MR or attention on follow-up imaging.   CT AP W Contrast 08/12/17  IMPRESSION: Continued thickening of the appendix is noted without surrounding inflammation. It has maximum measured diameter is decreased compared to prior exam. There is no evidence of acute inflammation. Stable mixed lytic and sclerotic appearance of visualized skeleton is noted consistent with history of known  osseous metastases. No acute abnormality seen in the abdomen or pelvis.   CT AP W Contrast 02/07/17 IMPRESSION: 1. No CT findings for abdominal/pelvic metastatic disease. 2. No acute abdominal findings, mass lesions or adenopathy. 3. Status post cholecystectomy with mild associated common bile duct dilatation. 4. Somewhat thickened appendix appears relatively stable. No acute inflammatory process. 5. Stable mixed lytic and sclerotic osseous metastatic disease.   Bone Scan Whole Body 02/07/17 IMPRESSION: No scintigraphic evidence skeletal metastasis    ASSESSMENT & PLAN:  Michaela Little is a 57 y.o. female with a history of HTN, HLD, Arthritis, Asthma, Sleep Apnea, and H. Pylori.   1. 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 -She has metastatic ER positive breast cancer to bones, has been on letrozole and Ibrance since March 2017. She has had her oncological care both in [place] and [place], she had a PET scan in December 2017 and  CT scan in April 2018. Due to her worsening bilateral rib cage pain, I obtained restaging CT abdomen and pelvis with contrast, and bone scan on 02/07/2017. I previously reviewed the scan findings with pt, which showed stable lipomatosis, not hypermetabolic on bone scan, no other new lesions.  No evidence of disease progression on the restaging scan -Given her increase  b/l rib cage pain, possible related to her letrozole along with her bone mets, I switched her to Exemestane (02/13/17) and her bone pain improved.  -She underwent appendectomy by Dr. Ninfa Linden and breast reconstruction to remove her breast implant by Dr. Harlow Little on 08/22/17. The pathology from her surgery showed evidence of metastatic breast cancer in her appendix, ER positive/PR and HER2 negative. I recommend she continue current regimen. She can restart Ibrance once  she recovers from surgery .  -I previously discussed her goal of therapy is palliative and she will continue on this for as long as she can tolerate of her disease progresses.  -We previously reviewed and discussed her CT AP and bone scan from 08/12/17 which shows no PE, but shows mild pericardial effusion. No current need for thoracentesis, will monitor. Bone scan shows Scattered degenerative type uptake at L2 vertebral body versus artifact; no abnormality is seen at this site by CT. Pericardial effusion is new from previous scan, echo on 03/09/2018 showed no pericardial effusion. Normal EF, grade 2 diastolic dysfunction. She was previously referred to cardiology by her primary care physician. -Labs will be done after our visit today. I will call her if there were any abnormal results.  -Continue Exemestane and Ibrance, she will start next cycle on 11/24/2017  -Continue Xgeva today and monthly  -I will repeat scan in 11-02/2018 -F/u in 4 weeks    2. Primary breast cancer of left breast overlapping sites diagnosed March of 2010 and treated [facility]-adjuvantly at [facility] with (a) paclitaxel weekly x12 and (b) doxorubicin/ cyclophosphamide in dose dense fashion x4, both given with tifiparnib.  -definitive left modified radical mastectomy September of 2010 for a T2 N2 or stage IIIA invasive lobular breast cancer, estrogen and progesterone receptor positive, HER-2 negative,  -post mastectomy radiation completed January of 2011, -briefly on tamoxifen, discontinued it because of uterine lining concerns.  On anastrozole since May 2012 with good tolerance. Held between November and December 2013 due to GI issues, unrelated to cancer or its treatment. Discontinued by the patient summer of 2016  3. Bilateral rib cage pain, Left chest wall pain  -She takes Tylenol and tramadol as needed.  -Bone pain previously increased to bilateral ribs. I have switched her letrozole to Exemestane. Her pain has improved. -She  previously declined narcotics, but I suggested she continue to take tylenol, Advil or aleve for her pain as we want to help her control her pain.  -I refilled her Tramadol today (09/05/17), she has left chest wall pain after the implant was removed  -she did PT   4. Lower Extremity Edema -She is on Lasix, given by her PCP Dr. Altamease Oiler -I previously suggested compression socks and advices her to elevate her feet when sitting at home.  -Repeat echocardiogram in 12/2016 and 09/2017 shows normal EF  5. Dyspnea, Cough -Uses Flovent -she will f/u with pulmonologist Dr. Melvyn Little    6. Insomnia  -She is on hydroxyzine already  -I suggested her to try melatonin previously -She was given gabapentin in the past, I previously advised if it is not working for her she can stop.   7. Left breast implant rupture, removed  -She underwent breast reconstruction on 08/22/17 and had implant completely removed by Dr. Marica [place]   8. 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 Ninfa Linden on 08/22/17 which was found to be  metastasis of her breast cancer.   9. Goal of care discussion  -We discussed the incurable nature of her cancer, and the overall poor prognosis, especially if she does not have good response to chemotherapy or progress on chemo -The patient understands the goal of care is palliative. -she is full code now   PLAN:  -Continue current therapy with exemestane,  Ibrance 75 mg, 3 weeks on, one-week off (next cycle starts on 9/1), and monthly Xgeva -I refilled exemestane today - F/u, labs and injection in 4 weeks.   No orders of the defined types were placed in this encounter.  All questions were answered. The patient knows to call the clinic with any problems, questions or concerns. No barriers to learning was detected.  I spent 20 minutes counseling  the patient face to face. The total time spent in the appointment was 25 minutes and more than 50% was on counseling and review of test results  I, Noor Dweik am acting as scribe for Dr. Truitt Little.  I have reviewed the above documentation for accuracy and completeness, and I agree with the above.     Michaela Merle, MD 11/18/2017 12:24 PM

## 2017-11-18 NOTE — Progress Notes (Signed)
Per Dr. Burr Medico proceed with Delton See injection without labs resulted. Pharmacy aware.

## 2017-11-19 ENCOUNTER — Encounter: Payer: Self-pay | Admitting: Hematology

## 2017-11-19 LAB — CANCER ANTIGEN 27.29: CA 27.29: 513.8 U/mL — ABNORMAL HIGH (ref 0.0–38.6)

## 2017-11-20 ENCOUNTER — Telehealth: Payer: Self-pay | Admitting: Hematology

## 2017-11-20 NOTE — Telephone Encounter (Signed)
Appts added / patient was not present to schedule add on lab appt/ IB message sent to Dr Burr Medico regarding this also per 8/26 los

## 2017-12-04 ENCOUNTER — Other Ambulatory Visit: Payer: Self-pay | Admitting: Hematology

## 2017-12-04 DIAGNOSIS — C50912 Malignant neoplasm of unspecified site of left female breast: Secondary | ICD-10-CM

## 2017-12-04 DIAGNOSIS — C7951 Secondary malignant neoplasm of bone: Principal | ICD-10-CM

## 2017-12-15 NOTE — Progress Notes (Deleted)
Michaela Little  Telephone:([phone]) [phone] Fax:(336) [phone]  Clinic Follow up Note   Patient Care Team: [name] as PCP - General (Physician Assistant) [name], MD as Consulting Physician (Pulmonary Disease) [name], MD as Consulting Physician (Hematology) 12/15/2017  SUMMARY OF ONCOLOGIC HISTORY: Oncology History   Cancer Staging Malignant neoplasm of overlapping sites of left breast in female, estrogen receptor positive ([place]) Staging form: Breast, AJCC 7th Edition - Clinical: Stage IIIA (T2, N2, M0) - Unsigned       Malignant neoplasm of overlapping sites of left breast in female, estrogen receptor positive ([place])   05/2008 Cancer Diagnosis    Left sided breast cancer (no path report available)    11/2008 Pathologic Stage    Stage IIIA: T2 N2     Neo-Adjuvant Chemotherapy    Paclitaxel weekly x 12; doxorubicin and cyclophosphamide x 4 - both given with trifiparnib (treated at [facility] in [place])    11/2008 Definitive Surgery    Left modified radiation mastectomy: invasive lobular carcinoma, ER+, PR+, HER2/neu negative, 5/22 LN positive     - 03/2009 Radiation Therapy    Adjuvant radiation to left chest wall    2011 - 08/2014 Anti-estrogen oral therapy    Tamoxifen 20 mg used briefly; discontinued due to uterine lining concerns; changed to anastrozole 1 mg daily (began 07/2100); discontinued by patient summer of 2016    05/26/2015 Progression    Iliac bone biopsy showed metastatic carcinoma consistent with a breast primary, ER positive, PR and HER-2 negative. Her staging CT, and a PET scan was negative for visceral metastasis.    05/2015 -  Anti-estrogen oral therapy    1. letrozole and Ibrance started March 2017             -Ibrance dose decreased to 75 mg daily  -Letrozole switched to Exemestane on 02/13/17 due to b/l rib pain.     11/16/2015 Miscellaneous    Xgeva monthly for bone metastasis     07/03/2016 Imaging    CT chest,  abdomen and pelvis with contrast showed no evidence of metastasis.     02/07/2017 Imaging    CT AP W Contrast 02/07/17 IMPRESSION: 1. No CT findings for abdominal/pelvic metastatic disease. 2. No acute abdominal findings, mass lesions or adenopathy. 3. Status post cholecystectomy with mild associated common bile duct dilatation. 4. Somewhat thickened appendix appears relatively stable. No acute inflammatory process. 5. Stable mixed lytic and sclerotic osseous metastatic disease.     02/07/2017 Imaging    Bone Scan Whole Body 02/07/17 IMPRESSION: No scintigraphic evidence skeletal metastasis     08/12/2017 Imaging    Whole Boday Scan 08/12/17 IMPRESSION: Scattered degenerative type uptake as above with a questionable focus of increased tracer localization at L2 vertebral body versus artifact; no abnormality is seen at this site by CT. Consider either characterization by MR or attention on follow-up imaging.    08/12/2017 Imaging    CT AP W Contrast 08/12/17  IMPRESSION: Continued thickening of the appendix is noted without surrounding inflammation. It has maximum measured diameter is decreased compared to prior exam. There is no evidence of acute inflammation. Stable mixed lytic and sclerotic appearance of visualized skeleton is noted consistent with history of known osseous metastases. No acute abnormality seen in the abdomen or pelvis.    08/22/2017 Surgery    APPENDECTOMY LAPAROSCOPIC ERAS PATHWAY by Dr. Malcolm Metro and REMOVAL OF LEFT TISSUE EXPANDER by Dr. Harlow Mares 08/22/17    08/22/2017 Pathology  Results    Diagnosis 08/22/17  1. Appendix, Other than Incidental - METASTATIC CARCINOMA, CONSISTENT WITH BREAST PRIMARY. - CARCINOMA IS PRESENT AT THE SURGICAL RESECTION MARGIN AS WELL AS THE SEROSAL SURFACE. - LYMPHOVASCULAR INVASION IS IDENTIFIED. - SEE COMMENT. 2. Breast, capsule, Left - DENSE FIBROUS TISSUE WITH MILD INFLAMMATION, INCLUDING A FEW SCATTERED MULTINUCLEATED  GIANT CELLS. - THERE IS NO EVIDENCE OF MALIGNANCY.    08/27/2017 Imaging    CT AP W Contrast 08/27/17 IMPRESSION: No evidence of pulmonary embolus. Small pericardial effusion with uncertain clinical significance. No evidence of acute abnormalities within the chest, abdomen or pelvis. Area of fat stranding and small focus of gas within the anterior abdominal wall, immediately inferior to the umbilicus, probably at the site of laparoscopic access.    10/07/2017 Echocardiogram    10/07/2017 ECO Study Conclusions  - Procedure narrative: Transthoracic echocardiography. Image   quality was adequate. The study was technically difficult, as a   result of poor acoustic windows. - Left ventricle: The cavity size was normal. Wall thickness was   normal. Systolic function was normal. The estimated ejection   fraction was in the range of 60% to 65%. Wall motion was normal;   there were no regional wall motion abnormalities. Features are   consistent with a pseudonormal left ventricular filling pattern,   with concomitant abnormal relaxation and increased filling   pressure (grade 2 diastolic dysfunction).   CURRENT TREATMENT:   1. letrozole and Ibrancestarted March 2017 -Ibrance dose decreased to 75 mg daily, held on 06/03/2017 due to surgery, restarted around 09/19/17   -Letrozole switched to Exemestane on 02/13/17 due to b/l rib pain.  2. Xgevastarted 11/16/2015, repeated monthly  INTERVAL HISTORY: Please see below for problem oriented charting.  REVIEW OF SYSTEMS:   Constitutional: Denies fevers, chills or abnormal weight loss Eyes: Denies blurriness of vision Ears, nose, mouth, throat, and face: Denies mucositis or sore throat Respiratory: Denies cough, dyspnea or wheezes Cardiovascular: Denies palpitation, chest discomfort or lower extremity swelling Gastrointestinal:  Denies nausea, heartburn or change in bowel habits Skin: Denies abnormal skin rashes Lymphatics: Denies new  lymphadenopathy or easy bruising Neurological:Denies numbness, tingling or new weaknesses Behavioral/Psych: Mood is stable, no new changes  All other systems were reviewed with the patient and are negative.  MEDICAL HISTORY:  Past Medical History:  Diagnosis Date  . Anxiety   . Arthritis    hands, knees, hips  . Asthma   . Bronchitis   . Cancer ([place])   . Cervical stenosis (uterine cervix)   . Disorder of appendix    Enlarged  . Dyspnea on exertion   . GERD (gastroesophageal reflux disease)   . Headache(784.0)   . History of breast cancer    2010--  LEFT  s/p  mastectomy (in [place]) AND CHEMORADIATION--  NO RECURRENCE  . History of cervical dysplasia   . Hyperlipidemia   . Hypertension   . Malignant neoplasm of overlapping sites of left breast in female, estrogen receptor positive ([place]) 03/05/2013  . OSA (obstructive sleep apnea) moderate osa per study  09/2010   CPAP  , NOT USING ON REGULAR BASIS  . Pelvic pain in female   . Positive H. pylori test    08-05-2013  . Positive TB test    AS TEEN--  [place]/ MEDS  . Refusal of blood transfusions as patient is Jehovah's Witness   . Seasonal allergies   . Uterine fibroid   . Wears glasses     SURGICAL  HISTORY: Past Surgical History:  Procedure Laterality Date  . APPENDECTOMY  y  . CARDIOVASCULAR STRESS TEST  10-29-2012   low risk perfusion study/  no significant reversibity/ ef 66%/  normal wall motion  . CERVICAL CONIZATION W/BX  2012   in  [place] N/A 05/14/2012   Procedure: LAPAROSCOPIC CHOLECYSTECTOMY;  Surgeon: Ralene Ok, MD;  Location: [facility];  Service: General;  Laterality: N/A;  . COLONOSCOPY  2011   normal per patient - [place]  . DILATION AND CURETTAGE OF UTERUS  10/15/2011   Procedure: DILATATION AND CURETTAGE;  Surgeon: Melina Schools, MD;  Location: [facility];  Service: Gynecology;  Laterality: N/A;  Conization &  endocervical curettings  . EXAMINATION UNDER ANESTHESIA N/A 08/20/2013   Procedure: EXAM  UNDER ANESTHESIA;  Surgeon: Margarette Asal, MD;  Location[facility];  Service: Gynecology;  Laterality: N/A;  . [place]   right  . LAPAROSCOPIC APPENDECTOMY N/A 08/22/2017   Procedure: [place];  Surgeon: Coralie Keens, MD;  Location: [facility];  Service: General;  Laterality: N/A;  . LAPAROSCOPIC ASSISTED VAGINAL HYSTERECTOMY N/A 10/26/2014   Procedure: HYSTERECTOMY ABDOMINAL ;  Surgeon: Molli Posey, MD;  Location: [facility];  Service: Gynecology;  Laterality: N/A;  . MASTECTOMY Left 11/2008  in [place]   . SALPINGOOPHORECTOMY Bilateral 10/26/2014   Procedure: BILATERAL SALPINGO OOPHORECTOMY;  Surgeon: Molli Posey, MD;  Location: [facility];  Service: Gynecology;  Laterality: Bilateral;  . TISSUE EXPANDER PLACEMENT Left 08/22/2017   Procedure: REMOVAL OF LEFT TISSUE EXPANDER;  Surgeon: Crissie Reese, MD;  Location: [facility];  Service: Plastics;  Laterality: Left;  . TISSUE EXPANDER REMOVAL Left 08/22/2017  . TRANSTHORACIC ECHOCARDIOGRAM  10-29-2012   mild lvh/  ef 60-65%/  grade II diastolic dysfunction/  trivial mr  &  tr    I have reviewed the social history and family history with the patient and they are unchanged from previous note.  ALLERGIES:  is allergic to lisinopril-hydrochlorothiazide; adhesive [tape]; fentanyl; gabapentin; hctz [hydrochlorothiazide]; latex; lisinopril; losartan potassium; other; and oxycodone.  MEDICATIONS:  Current Outpatient Medications  Medication Sig Dispense Refill  . albuterol (PROVENTIL HFA;VENTOLIN HFA) 108 (90 Base) MCG/ACT inhaler Inhale 2 puffs into the lungs every 6 (six) hours as needed for wheezing or shortness of breath.    Marland Kitchen amLODipine (NORVASC) 10 MG tablet Take 1 tablet (10 mg total) by mouth daily. 90 tablet 3  . benzonatate (TESSALON) 100 MG capsule Take 1 capsule (100 mg total) by mouth 2 (two) times daily as needed for cough. 30 capsule 0  . carvedilol (COREG) 6.25 MG  tablet Take 1 tablet (6.25 mg total) by mouth 2 (two) times daily with a meal. (Patient taking differently: Take 12.5 mg by mouth daily. ) 180 tablet 3  . exemestane (AROMASIN) 25 MG tablet Take 1 tablet (25 mg total) by mouth daily after breakfast. 90 tablet 1  . fluticasone furoate-vilanterol (BREO ELLIPTA) 100-25 MCG/INH AEPB Inhale 1 puff into the lungs daily. 28 each 5  . furosemide (LASIX) 20 MG tablet Take 1 tablet (20 mg total) by mouth daily as needed (for fluid retention.). 30 tablet 5  . IBRANCE 75 MG capsule TAKE ONE CAPSULE BY MOUTH DAILY WITH FOOD FOR 21 DAYS OF A 28 DAY CYCLE. SWALLOW WHOLE. DO NOT TAKE IF CAPSULES ARE BROKEN OR CRACKED. AVOID 21 capsule 1  . ipratropium-albuterol (DUONEB) 0.5-2.5 (3) MG/3ML SOLN Take 3 mLs  by nebulization every 6 (six) hours as needed. 360 mL 5  . loratadine (CLARITIN) 10 MG tablet Take 20 mg by mouth daily.     . montelukast (SINGULAIR) 10 MG tablet Take 1 tablet (10 mg total) by mouth at bedtime. 30 tablet 5  . naproxen (NAPROSYN) 500 MG tablet Take 1 tablet (500 mg total) by mouth 2 (two) times daily with a meal. 30 tablet 0  . pantoprazole (PROTONIX) 40 MG tablet Take 1 tablet (40 mg total) by mouth daily. Take 30-60 min before first meal of the day 30 tablet 2  . predniSONE (DELTASONE) 10 MG tablet 2 tabs daily x 5 days 10 tablet 0  . promethazine (PHENERGAN) 25 MG tablet Take 1 tablet (25 mg total) by mouth every 6 (six) hours as needed for nausea or vomiting. 30 tablet 0  . ranitidine (ZANTAC) 300 MG tablet Take 1 tablet (300 mg total) by mouth at bedtime. (Patient taking differently: Take 300 mg by mouth at bedtime as needed (for indigestion.). ) 30 tablet 5  . traMADol (ULTRAM) 50 MG tablet Take 1-2 tablets (50-100 mg total) by mouth every 6 (six) hours as needed for moderate pain or severe pain. 30 tablet 0   No current facility-administered medications for this visit.     PHYSICAL EXAMINATION: ECOG PERFORMANCE STATUS: {CHL ONC ECOG  PS:[phone]}  There were no vitals filed for this visit. There were no vitals filed for this visit.  GENERAL:alert, no distress and comfortable SKIN: skin color, texture, turgor are normal, no rashes or significant lesions EYES: normal, Conjunctiva are pink and non-injected, sclera clear OROPHARYNX:no exudate, no erythema and lips, buccal mucosa, and tongue normal  NECK: supple, thyroid normal size, non-tender, without nodularity LYMPH:  no palpable lymphadenopathy in the cervical, axillary or inguinal LUNGS: clear to auscultation and percussion with normal breathing effort HEART: regular rate & rhythm and no murmurs and no lower extremity edema ABDOMEN:abdomen soft, non-tender and normal bowel sounds Musculoskeletal:no cyanosis of digits and no clubbing  NEURO: alert & oriented x 3 with fluent speech, no focal motor/sensory deficits  LABORATORY DATA:  I have reviewed the data as listed CBC Latest Ref Rng & Units 11/18/2017 10/15/2017 09/25/2017  WBC 3.9 - 10.3 K/uL 1.6(L) 1.9(L) 3.7  Hemoglobin 11.6 - 15.9 g/dL 7.9(L) 9.7(L) 10.3(L)  Hematocrit 34.8 - 46.6 % 23.8(L) 29.3(L) 32.3(L)  Platelets 145 - 400 K/uL 99(L) 144(L) 271     CMP Latest Ref Rng & Units 11/18/2017 10/15/2017 09/05/2017  Glucose 70 - 99 mg/dL 112(H) 105(H) 118  BUN 6 - 20 mg/dL _0 Creatinine 0.44 - 1.00 mg/dL 0.93 0.87 0.77  Sodium 135 - 145 mmol/L 141 141 139  Potassium 3.5 - 5.1 mmol/L 3.9 4.0 4.3  Chloride 98 - 111 mmol/L 108 107 106  CO2 22 - 32 mmol/L _1 Calcium 8.9 - 10.3 mg/dL 9.9 9.8 9.3  Total Protein 6.5 - 8.1 g/dL 7.7 7.4 7.6  Total Bilirubin 0.3 - 1.2 mg/dL 1.2 1.2 0.8  Alkaline Phos 38 - 126 U/L 138(H) 128(H) 147  AST 15 - 41 U/L 50(H) 24 25  ALT 0 - 44 U/L 46(H) 11 14      RADIOGRAPHIC STUDIES: I have personally reviewed the radiological images as listed and agreed with the findings in the report. No results found.   ASSESSMENT & PLAN:  No problem-specific Assessment & Plan  notes found for this encounter.   No orders of the defined types were  placed in this encounter.  All questions were answered. The patient knows to call the clinic with any problems, questions or concerns. No barriers to learning was detected. I spent {CHL ONC TIME VISIT - JJHER:[id number]} counseling the patient face to face. The total time spent in the appointment was {CHL ONC TIME VISIT - HUDJS:[id number]} and more than 50% was on counseling and review of test results     Alla Feeling, NP 12/15/17

## 2017-12-16 ENCOUNTER — Telehealth: Payer: Self-pay | Admitting: *Deleted

## 2017-12-16 ENCOUNTER — Inpatient Hospital Stay: Payer: Medicare Other | Admitting: Nurse Practitioner

## 2017-12-16 ENCOUNTER — Inpatient Hospital Stay: Payer: Medicare Other

## 2017-12-16 ENCOUNTER — Telehealth: Payer: Self-pay | Admitting: Nurse Practitioner

## 2017-12-16 NOTE — Telephone Encounter (Signed)
Called pt due to missed appt today.  She states she called 3 days ago to cancel b/c she is out of town & won't be back until Wednesday.  Message to schedulers to r/s & call pt.

## 2017-12-16 NOTE — Telephone Encounter (Signed)
R./s todays appt to 9/26 pt is aware of appt date and time .

## 2017-12-18 ENCOUNTER — Telehealth: Payer: Self-pay | Admitting: *Deleted

## 2017-12-18 NOTE — Telephone Encounter (Signed)
TC from patient. She states she is experiencing palpitations and fluttering of her heart, weakness and  syncopy when standing or ambulating.. She states she also feels pressure in her chest. Advised pt that she needs to be evaluated in the ED as they are better equipped to deal with potentially urgent heart issues.  Pt states that no one is there with her right now but she can call someone. Also informed patient that she call call 911 if no one is available at the home. She voiced understanding and will go to the ED

## 2017-12-19 ENCOUNTER — Inpatient Hospital Stay: Payer: Medicare Other

## 2017-12-19 ENCOUNTER — Inpatient Hospital Stay (HOSPITAL_BASED_OUTPATIENT_CLINIC_OR_DEPARTMENT_OTHER): Payer: Medicare Other | Admitting: Nurse Practitioner

## 2017-12-19 ENCOUNTER — Inpatient Hospital Stay: Payer: Medicare Other | Attending: Hematology

## 2017-12-19 ENCOUNTER — Telehealth: Payer: Self-pay | Admitting: Nurse Practitioner

## 2017-12-19 ENCOUNTER — Ambulatory Visit: Payer: Medicare Other

## 2017-12-19 ENCOUNTER — Other Ambulatory Visit (INDEPENDENT_AMBULATORY_CARE_PROVIDER_SITE_OTHER): Payer: Self-pay | Admitting: Physician Assistant

## 2017-12-19 ENCOUNTER — Telehealth (INDEPENDENT_AMBULATORY_CARE_PROVIDER_SITE_OTHER): Payer: Self-pay

## 2017-12-19 ENCOUNTER — Other Ambulatory Visit: Payer: Self-pay | Admitting: Medical Oncology

## 2017-12-19 VITALS — BP 137/71 | HR 86 | Temp 98.6°F | Resp 18 | Ht 67.0 in | Wt 260.5 lb

## 2017-12-19 DIAGNOSIS — D61818 Other pancytopenia: Secondary | ICD-10-CM | POA: Diagnosis not present

## 2017-12-19 DIAGNOSIS — C50812 Malignant neoplasm of overlapping sites of left female breast: Secondary | ICD-10-CM

## 2017-12-19 DIAGNOSIS — D649 Anemia, unspecified: Secondary | ICD-10-CM

## 2017-12-19 DIAGNOSIS — Z170 Estrogen receptor positive status [ER+]: Secondary | ICD-10-CM | POA: Insufficient documentation

## 2017-12-19 DIAGNOSIS — C50912 Malignant neoplasm of unspecified site of left female breast: Secondary | ICD-10-CM

## 2017-12-19 DIAGNOSIS — R05 Cough: Secondary | ICD-10-CM

## 2017-12-19 DIAGNOSIS — C7951 Secondary malignant neoplasm of bone: Secondary | ICD-10-CM

## 2017-12-19 DIAGNOSIS — D696 Thrombocytopenia, unspecified: Secondary | ICD-10-CM

## 2017-12-19 DIAGNOSIS — I10 Essential (primary) hypertension: Secondary | ICD-10-CM

## 2017-12-19 DIAGNOSIS — R0789 Other chest pain: Secondary | ICD-10-CM

## 2017-12-19 DIAGNOSIS — C785 Secondary malignant neoplasm of large intestine and rectum: Secondary | ICD-10-CM | POA: Diagnosis not present

## 2017-12-19 DIAGNOSIS — R740 Nonspecific elevation of levels of transaminase and lactic acid dehydrogenase [LDH]: Secondary | ICD-10-CM

## 2017-12-19 DIAGNOSIS — R0609 Other forms of dyspnea: Secondary | ICD-10-CM

## 2017-12-19 DIAGNOSIS — Z5189 Encounter for other specified aftercare: Secondary | ICD-10-CM

## 2017-12-19 LAB — COMPREHENSIVE METABOLIC PANEL
ALBUMIN: 3.6 g/dL (ref 3.5–5.0)
ALK PHOS: 130 U/L — AB (ref 38–126)
ALT: 18 U/L (ref 0–44)
ANION GAP: 7 (ref 5–15)
AST: 27 U/L (ref 15–41)
BUN: 11 mg/dL (ref 6–20)
CALCIUM: 9 mg/dL (ref 8.9–10.3)
CHLORIDE: 109 mmol/L (ref 98–111)
CO2: 24 mmol/L (ref 22–32)
Creatinine, Ser: 0.8 mg/dL (ref 0.44–1.00)
GFR calc Af Amer: >60 mL/min (ref >60)
GFR calc non Af Amer: >60 mL/min (ref >60)
GLUCOSE: 130 mg/dL — AB (ref 70–99)
Potassium: 3.9 mmol/L (ref 3.5–5.1)
SODIUM: 140 mmol/L (ref 135–145)
Total Bilirubin: 1.4 mg/dL — ABNORMAL HIGH (ref 0.3–1.2)
Total Protein: 7 g/dL (ref 6.5–8.1)

## 2017-12-19 LAB — CBC WITH DIFFERENTIAL/PLATELET
Basophils Absolute: 0 10*3/uL (ref 0.0–0.1)
Basophils Relative: 1 %
EOS ABS: 0 10*3/uL (ref 0.0–0.5)
Eosinophils Relative: 1 %
HEMATOCRIT: 14.2 % — AB (ref 34.8–46.6)
HEMOGLOBIN: 4.6 g/dL — AB (ref 11.6–15.9)
LYMPHS ABS: 0.9 10*3/uL (ref 0.9–3.3)
LYMPHS PCT: 57 %
MCH: 26.9 pg (ref 25.1–34.0)
MCHC: 32.4 g/dL (ref 31.5–36.0)
MCV: 83 fL (ref 79.5–101.0)
MONOS PCT: 7 %
Monocytes Absolute: 0.1 10*3/uL (ref 0.1–0.9)
NEUTROS PCT: 34 %
Neutro Abs: 0.5 10*3/uL — ABNORMAL LOW (ref 1.5–6.5)
Platelets: 49 10*3/uL — ABNORMAL LOW (ref 145–400)
RBC: 1.71 MIL/uL — ABNORMAL LOW (ref 3.70–5.45)
RDW: 20 % — ABNORMAL HIGH (ref 11.2–14.5)
WBC: 1.6 10*3/uL — ABNORMAL LOW (ref 3.9–10.3)

## 2017-12-19 LAB — LACTATE DEHYDROGENASE: LDH: 421 U/L — ABNORMAL HIGH (ref 98–192)

## 2017-12-19 LAB — IRON AND TIBC
IRON: 252 ug/dL — AB (ref 41–142)
Saturation Ratios: 96 % — ABNORMAL HIGH (ref 21–57)
TIBC: 263 ug/dL (ref 236–444)
UIBC: 11 ug/dL

## 2017-12-19 LAB — SAMPLE TO BLOOD BANK

## 2017-12-19 LAB — RETICULOCYTES
RBC.: 1.89 MIL/uL — ABNORMAL LOW (ref 3.70–5.45)
Retic Count, Absolute: 30.2 10*3/uL — ABNORMAL LOW (ref 33.7–90.7)
Retic Ct Pct: 1.6 % (ref 0.7–2.1)

## 2017-12-19 LAB — FERRITIN: Ferritin: 2188 ng/mL — ABNORMAL HIGH (ref 11–307)

## 2017-12-19 MED ORDER — PROMETHAZINE HCL 25 MG PO TABS: 25.0000 mg | ORAL_TABLET | Freq: Four times a day (QID) | ORAL | 0 refills | Status: DC | PRN

## 2017-12-19 MED ORDER — DENOSUMAB 120 MG/1.7ML ~~LOC~~ SOLN
SUBCUTANEOUS | Status: AC
  Filled 2017-12-19: qty 1.7

## 2017-12-19 MED ORDER — CARVEDILOL 6.25 MG PO TABS: 6.2500 mg | ORAL_TABLET | Freq: Two times a day (BID) | ORAL | 3 refills | Status: DC

## 2017-12-19 MED ORDER — DENOSUMAB 120 MG/1.7ML ~~LOC~~ SOLN
120.0000 mg | Freq: Once | SUBCUTANEOUS | Status: AC
  Administered 2017-12-19: 120 mg via SUBCUTANEOUS

## 2017-12-19 NOTE — Telephone Encounter (Signed)
Rx sent. Take as directed.

## 2017-12-19 NOTE — Progress Notes (Addendum)
[place]  Telephone:([phone]) [phone] Fax:(336) [phone]  Clinic Follow up Note   Patient Care Team: [name] as PCP - General (Physician Assistant) [name], MD as Consulting Physician (Pulmonary Disease) [name], MD as Consulting Physician (Hematology) Date of Service: 12/19/17   SUMMARY OF ONCOLOGIC HISTORY: Oncology History   Cancer Staging Malignant neoplasm of overlapping sites of left breast in female, estrogen receptor positive ([place]) Staging form: Breast, AJCC 7th Edition - Clinical: Stage IIIA (T2, N2, M0) - Unsigned       Malignant neoplasm of overlapping sites of left breast in female, estrogen receptor positive ([place])   05/2008 Cancer Diagnosis    Left sided breast cancer (no path report available)    11/2008 Pathologic Stage    Stage IIIA: T2 N2     Neo-Adjuvant Chemotherapy    Paclitaxel weekly x 12; doxorubicin and cyclophosphamide x 4 - both given with trifiparnib (treated at [facility] in [place])    11/2008 Definitive Surgery    Left modified radiation mastectomy: invasive lobular carcinoma, ER+, PR+, HER2/neu negative, 5/22 LN positive     - 03/2009 Radiation Therapy    Adjuvant radiation to left chest wall    2011 - 08/2014 Anti-estrogen oral therapy    Tamoxifen 20 mg used briefly; discontinued due to uterine lining concerns; changed to anastrozole 1 mg daily (began 07/2100); discontinued by patient summer of 2016    05/26/2015 Progression    Iliac bone biopsy showed metastatic carcinoma consistent with a breast primary, ER positive, PR and HER-2 negative. Her staging CT, and a PET scan was negative for visceral metastasis.    05/2015 -  Anti-estrogen oral therapy    1. letrozole and Ibrance started March 2017             -Ibrance dose decreased to 75 mg daily  -Letrozole switched to Exemestane on 02/13/17 due to b/l rib pain.     11/16/2015 Miscellaneous    Xgeva monthly for bone metastasis     07/03/2016 Imaging      CT chest, abdomen and pelvis with contrast showed no evidence of metastasis.     02/07/2017 Imaging    CT AP W Contrast 02/07/17 IMPRESSION: 1. No CT findings for abdominal/pelvic metastatic disease. 2. No acute abdominal findings, mass lesions or adenopathy. 3. Status post cholecystectomy with mild associated common bile duct dilatation. 4. Somewhat thickened appendix appears relatively stable. No acute inflammatory process. 5. Stable mixed lytic and sclerotic osseous metastatic disease.     02/07/2017 Imaging    Bone Scan Whole Body 02/07/17 IMPRESSION: No scintigraphic evidence skeletal metastasis     08/12/2017 Imaging    Whole Boday Scan 08/12/17 IMPRESSION: Scattered degenerative type uptake as above with a questionable focus of increased tracer localization at L2 vertebral body versus artifact; no abnormality is seen at this site by CT. Consider either characterization by MR or attention on follow-up imaging.    08/12/2017 Imaging    CT AP W Contrast 08/12/17  IMPRESSION: Continued thickening of the appendix is noted without surrounding inflammation. It has maximum measured diameter is decreased compared to prior exam. There is no evidence of acute inflammation. Stable mixed lytic and sclerotic appearance of visualized skeleton is noted consistent with history of known osseous metastases. No acute abnormality seen in the abdomen or pelvis.    08/22/2017 Surgery    APPENDECTOMY LAPAROSCOPIC ERAS PATHWAY by Dr. Malcolm Metro and REMOVAL OF LEFT TISSUE EXPANDER by Dr. Harlow Mares 08/22/17  08/22/2017 Pathology Results    Diagnosis 08/22/17  1. Appendix, Other than Incidental - METASTATIC CARCINOMA, CONSISTENT WITH BREAST PRIMARY. - CARCINOMA IS PRESENT AT THE SURGICAL RESECTION MARGIN AS WELL AS THE SEROSAL SURFACE. - LYMPHOVASCULAR INVASION IS IDENTIFIED. - SEE COMMENT. 2. Breast, capsule, Left - DENSE FIBROUS TISSUE WITH MILD INFLAMMATION, INCLUDING A FEW SCATTERED  MULTINUCLEATED GIANT CELLS. - THERE IS NO EVIDENCE OF MALIGNANCY.    08/27/2017 Imaging    CT AP W Contrast 08/27/17 IMPRESSION: No evidence of pulmonary embolus. Small pericardial effusion with uncertain clinical significance. No evidence of acute abnormalities within the chest, abdomen or pelvis. Area of fat stranding and small focus of gas within the anterior abdominal wall, immediately inferior to the umbilicus, probably at the site of laparoscopic access.    10/07/2017 Echocardiogram    10/07/2017 ECO Study Conclusions  - Procedure narrative: Transthoracic echocardiography. Image   quality was adequate. The study was technically difficult, as a   result of poor acoustic windows. - Left ventricle: The cavity size was normal. Wall thickness was   normal. Systolic function was normal. The estimated ejection   fraction was in the range of 60% to 65%. Wall motion was normal;   there were no regional wall motion abnormalities. Features are   consistent with a pseudonormal left ventricular filling pattern,   with concomitant abnormal relaxation and increased filling   pressure (grade 2 diastolic dysfunction).   CURRENT TREATMENT:   1. letrozole and Ibrancestarted March 2017 -Ibrance dose decreased to 75 mg daily, held on 06/03/2017 due to surgery, restarted around 09/19/17   -Letrozole switched to Exemestane on 02/13/17 due to b/l rib pain.  2. Xgevastarted 11/16/2015, repeated monthly  INTERVAL HISTORY: Michaela Little returns for follow up as scheduled. She recently went to [facility] and feels exhausted, which has worsened over the last month to the point she can take only a few steps without getting very tired. She has difficulty with ADLs. She feels her heart racing and pressure in her chest without overt chest pain. She is dyspneic on exertion. Has intermittent cough at baseline. No fever or chills. She had 1 stream of blood in stool 2-3 weeks ago, otherwise no history of GI bleeding. Takes  NSAIDs and tramadol for leg pain occasionally. Nausea is controlled with phenergan, no vomiting, constipation, or diarrhea. Appetite is normal.    MEDICAL HISTORY:  Past Medical History:  Diagnosis Date  . Anxiety   . Arthritis    hands, knees, hips  . Asthma   . Bronchitis   . Cancer ([place])   . Cervical stenosis (uterine cervix)   . Disorder of appendix    Enlarged  . Dyspnea on exertion   . GERD (gastroesophageal reflux disease)   . Headache(784.0)   . History of breast cancer    2010--  LEFT  s/p  mastectomy (in [place]) AND CHEMORADIATION--  NO RECURRENCE  . History of cervical dysplasia   . Hyperlipidemia   . Hypertension   . Malignant neoplasm of overlapping sites of left breast in female, estrogen receptor positive ([place]) 03/05/2013  . OSA (obstructive sleep apnea) moderate osa per study  09/2010   CPAP  , NOT USING ON REGULAR BASIS  . Pelvic pain in female   . Positive H. pylori test    08-05-2013  . Positive TB test    AS TEEN--  [place] W/ MEDS  . Refusal of blood transfusions as patient is Jehovah's Witness   . Seasonal allergies   .  Uterine fibroid   . Wears glasses     SURGICAL HISTORY: Past Surgical History:  Procedure Laterality Date  . APPENDECTOMY  y  . CARDIOVASCULAR STRESS TEST  10-29-2012   low risk perfusion study/  no significant reversibity/ ef 66%/  normal wall motion  . CERVICAL CONIZATION W/BX  2012   in  [place] N/A 05/14/2012   Procedure: LAPAROSCOPIC CHOLECYSTECTOMY;  Surgeon: Ralene Ok, MD;  Location: [facility];  Service: General;  Laterality: N/A;  . COLONOSCOPY  2011   normal per patient - [place]  . DILATION AND CURETTAGE OF UTERUS  10/15/2011   Procedure: DILATATION AND CURETTAGE;  Surgeon: Melina Schools, MD;  Location: [facility];  Service: Gynecology;  Laterality: N/A;  Conization &  endocervical curettings  . EXAMINATION UNDER ANESTHESIA N/A 08/20/2013   Procedure: EXAM UNDER ANESTHESIA;  Surgeon: Margarette Asal, MD;  Location:  [facility];  Service: Gynecology;  Laterality: N/A;  . [place]   right  . LAPAROSCOPIC APPENDECTOMY N/A 08/22/2017   Procedure: [place];  Surgeon: Coralie Keens, MD;  Location: [facility];  Service: General;  Laterality: N/A;  . LAPAROSCOPIC ASSISTED VAGINAL HYSTERECTOMY N/A 10/26/2014   Procedure: HYSTERECTOMY ABDOMINAL ;  Surgeon: Molli Posey, MD;  Location: [facility];  Service: Gynecology;  Laterality: N/A;  . MASTECTOMY Left 11/2008  in [place]   . SALPINGOOPHORECTOMY Bilateral 10/26/2014   Procedure: BILATERAL SALPINGO OOPHORECTOMY;  Surgeon: Molli Posey, MD;  Location: [facility];  Service: Gynecology;  Laterality: Bilateral;  . TISSUE EXPANDER PLACEMENT Left 08/22/2017   Procedure: REMOVAL OF LEFT TISSUE EXPANDER;  Surgeon: Crissie Reese, MD;  Location: [facility];  Service: Plastics;  Laterality: Left;  . TISSUE EXPANDER REMOVAL Left 08/22/2017  . TRANSTHORACIC ECHOCARDIOGRAM  10-29-2012   mild lvh/  ef 60-65%/  grade II diastolic dysfunction/  trivial mr  &  tr    I have reviewed the social history and family history with the patient and they are unchanged from previous note.  ALLERGIES:  is allergic to lisinopril-hydrochlorothiazide; adhesive [tape]; fentanyl; gabapentin; hctz [hydrochlorothiazide]; latex; lisinopril; losartan potassium; other; and oxycodone.  MEDICATIONS:  Current Outpatient Medications  Medication Sig Dispense Refill  . albuterol (PROVENTIL HFA;VENTOLIN HFA) 108 (90 Base) MCG/ACT inhaler Inhale 2 puffs into the lungs every 6 (six) hours as needed for wheezing or shortness of breath.    Marland Kitchen amLODipine (NORVASC) 10 MG tablet Take 1 tablet (10 mg total) by mouth daily. 90 tablet 3  . benzonatate (TESSALON) 100 MG capsule Take 1 capsule (100 mg total) by mouth 2 (two) times daily as needed for cough. 30 capsule 0  . carvedilol (COREG) 6.25 MG tablet Take 1 tablet (6.25 mg total) by mouth 2 (two) times  daily with a meal. 180 tablet 3  . exemestane (AROMASIN) 25 MG tablet Take 1 tablet (25 mg total) by mouth daily after breakfast. 90 tablet 1  . fluticasone furoate-vilanterol (BREO ELLIPTA) 100-25 MCG/INH AEPB Inhale 1 puff into the lungs daily. 28 each 5  . furosemide (LASIX) 20 MG tablet Take 1 tablet (20 mg total) by mouth daily as needed (for fluid retention.). 30 tablet 5  . IBRANCE 75 MG capsule TAKE ONE CAPSULE BY MOUTH DAILY WITH FOOD FOR 21 DAYS OF A 28 DAY CYCLE. SWALLOW WHOLE. DO NOT TAKE IF CAPSULES ARE BROKEN OR CRACKED. AVOID 21 capsule 1  . ipratropium-albuterol (DUONEB) 0.5-2.5 (3) MG/3ML SOLN Take  3 mLs by nebulization every 6 (six) hours as needed. 360 mL 5  . loratadine (CLARITIN) 10 MG tablet Take 20 mg by mouth daily.     . montelukast (SINGULAIR) 10 MG tablet Take 1 tablet (10 mg total) by mouth at bedtime. 30 tablet 5  . naproxen (NAPROSYN) 500 MG tablet Take 1 tablet (500 mg total) by mouth 2 (two) times daily with a meal. 30 tablet 0  . pantoprazole (PROTONIX) 40 MG tablet Take 1 tablet (40 mg total) by mouth daily. Take 30-60 min before first meal of the day 30 tablet 2  . predniSONE (DELTASONE) 10 MG tablet 2 tabs daily x 5 days 10 tablet 0  . promethazine (PHENERGAN) 25 MG tablet Take 1 tablet (25 mg total) by mouth every 6 (six) hours as needed for nausea or vomiting. 30 tablet 0  . ranitidine (ZANTAC) 300 MG tablet Take 1 tablet (300 mg total) by mouth at bedtime. (Patient taking differently: Take 300 mg by mouth at bedtime as needed (for indigestion.). ) 30 tablet 5  . traMADol (ULTRAM) 50 MG tablet Take 1-2 tablets (50-100 mg total) by mouth every 6 (six) hours as needed for moderate pain or severe pain. 30 tablet 0   No current facility-administered medications for this visit.     PHYSICAL EXAMINATION: ECOG PERFORMANCE STATUS: 1 - Symptomatic but completely ambulatory  Vitals:   12/19/17 1141  BP: 137/71  Pulse: 86  Resp: 18  Temp: 98.6 F (37 C)  SpO2:  100%   Filed Weights   12/19/17 1141  Weight: 260 lb 8 oz (118.2 kg)    GENERAL:alert, no distress and comfortable SKIN: no rashes or significant lesions EYES:  sclera clear OROPHARYNX:no thrush or ulcers  LYMPH:  no palpable cervical, supraclavicular, or axillary lymphadenopathy LUNGS: clear to auscultation with normal breathing effort HEART: regular rate & rhythm, no lower extremity edema ABDOMEN:abdomen soft, non-tender and normal bowel sounds Musculoskeletal:no cyanosis of digits and no clubbing  NEURO: alert & oriented x 3 with fluent speech, no focal motor/sensory deficits BREAST: exam reveals left mastectomy, no nodularity along the chest wall or mass in right breast. No axillary adenopathy.   LABORATORY DATA:  I have reviewed the data as listed CBC Latest Ref Rng & Units 12/19/2017 11/18/2017 10/15/2017  WBC 3.9 - 10.3 K/uL 1.6(L) 1.6(L) 1.9(L)  Hemoglobin 11.6 - 15.9 g/dL 4.6(LL) 7.9(L) 9.7(L)  Hematocrit 34.8 - 46.6 % 14.2(L) 23.8(L) 29.3(L)  Platelets 145 - 400 K/uL 49(L) 99(L) 144(L)     CMP Latest Ref Rng & Units 12/19/2017 11/18/2017 10/15/2017  Glucose 70 - 99 mg/dL 130(H) 112(H) 105(H)  BUN 6 - 20 mg/dL 11 16 14   Creatinine 0.44 - 1.00 mg/dL 0.80 0.93 0.87  Sodium 135 - 145 mmol/L 140 141 141  Potassium 3.5 - 5.1 mmol/L 3.9 3.9 4.0  Chloride 98 - 111 mmol/L 109 108 107  CO2 22 - 32 mmol/L 24 25 25   Calcium 8.9 - 10.3 mg/dL 9.0 9.9 9.8  Total Protein 6.5 - 8.1 g/dL 7.0 7.7 7.4  Total Bilirubin 0.3 - 1.2 mg/dL 1.4(H) 1.2 1.2  Alkaline Phos 38 - 126 U/L 130(H) 138(H) 128(H)  AST 15 - 41 U/L 27 50(H) 24  ALT 0 - 44 U/L 18 46(H) 11   PATHOLOGY   Diagnosis 08/22/17  1. Appendix, Other than Incidental - METASTATIC CARCINOMA, CONSISTENT WITH BREAST PRIMARY. - CARCINOMA IS PRESENT AT THE SURGICAL RESECTION MARGIN AS WELL AS THE SEROSAL SURFACE. - LYMPHOVASCULAR INVASION  IS IDENTIFIED. - SEE COMMENT. 2. Breast, capsule, Left - DENSE FIBROUS TISSUE WITH MILD  INFLAMMATION, INCLUDING A FEW SCATTERED MULTINUCLEATED GIANT CELLS. - THERE IS NO EVIDENCE OF MALIGNANCY. Microscopic Comment 1. Grossly, the appendiceal wall is thickened. Histologic evaluation reveals diffuse involvement by a malignant process, consisting of small relatively uniform discohesive tumor cells involving the submucosa as well as the periappendiceal soft tissue, including the serosal surface. Immunohistochemical stains were performed revealing that the tumor cells are positive for cytokeratin 7, GATA-3, estrogen receptor and focally positive for GCDFP. Tumor cells are negative for CD56, cytokeratin 20, chromogranin and synaptophysin. A Ki-67 stain is positive in approximately 70% of the tumor cells. Overall, the findings are consistent with metastatic carcinoma of breast primary and may be a lobular phenotype. A complete breast prognostic profile will be performed and the results reported separately. Dr. Vicente Males has reviewed selected slides and concurs with this interpretation. (JBK:kh 08-25-17)    RADIOGRAPHIC STUDIES: I have personally reviewed the radiological images as listed and agreed with the findings in the report. No results found.   ASSESSMENT & PLAN: Michaela Little is a 57 y.o. female with a history of HTN, HLD, Arthritis, Asthma, Sleep Apnea, and H. Pylori.   1. 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 Has been tolerating treatment well overall, but developed significant pancytopenia with severe anemia Hgb 4.6 today, she is profoundly fatigued. plt 49K, ANC 0.5. -She is Jehovah's witness and will not accept blood transfusion;  -No history of GI bleed; colonoscopy in 2015 was normal.  -Dr. Burr Medico explained this is a crisis and she is at high risk for cardiac event  -additional labs today revealed elevated LDH, haptoglobin and iron studies;  -Lab findings likely due  to ibrance, although this degree of pancytopenia is not common with ibrance -Dr. Burr Medico reviewed alternative treatment for anemia with aranesp and discussed the black box warning in metastatic cancer. Gave her printed material, patient will consider and we will submit for insurance approval  -if insurance approved but the patient refused injection, due to concern for side effects and tumor growth  -patient lives alone, she will have phone near her and was strongly instructed not to exert herself  -We strongly encouraged her to go to ED if she has new or worsening symptoms especially chest pain or dyspnea, she agrees  -Iron is not low, no need for iron infusion for now -Stop ibrance, continue exemestane and xgeva (due today) -recheck lab 9/30; f/u in 2 weeks    2. Primary breast cancer of left breast overlapping sites diagnosed March of 2010 and treated neo-adjuvantly at [facility] with (a) paclitaxel weekly x12 and (b) doxorubicin/ cyclophosphamidein dose dense fashion x4, both given with tifiparnib.  -definitive left modified radical mastectomy September of 2010 for a T2 N2 or stage IIIA invasive lobularbreast cancer, estrogen and progesterone receptor positive, HER-2 negative,  -post mastectomy radiation completed January of 2011, -briefly on tamoxifen, discontinued it because of uterine lining concerns. On anastrozolesince May 2012 with good tolerance. Held between November and December 2013 due to GI issues, unrelated to cancer or its treatment. Discontinued by the patient summer of 2016  3. Bilateral rib cage pain, Left chest wall pain  -stable   4. Lower Extremity Edema -Repeat echocardiogram in 12/2016 and 09/2017 shows normal EF  5. Dyspnea, Cough -Uses Flovent -followed by pulmonologist Dr. Melvyn Novas    6. Insomnia  7. Left breast implant rupture, removed  -She underwent breast reconstruction on 08/22/17 and had implant completely removed by Dr. Marica Otter   8. Metastatic cancer  to Appendix, resected -s/p appendectomy with Dr. Ninfa Linden on 08/22/17 which was found to be metastasis of her breast cancer.   9. Goal of care discussion  -she is full code now   PLAN:  -Labs reviewed, stop ibrance  -Continue daily exemestane and monthly Xgeva, due today -Patient declined aranesp  -Lab on 9/30 -F/u in 2 weeks    Orders Placed This Encounter  Procedures  . Reticulocytes    Standing Status:   Future    Number of Occurrences:   1    Standing Expiration Date:   12/20/2018  . Iron and TIBC    Standing Status:   Future    Number of Occurrences:   1    Standing Expiration Date:   12/19/2018  . Ferritin    Standing Status:   Future    Number of Occurrences:   1    Standing Expiration Date:   12/19/2018  . Haptoglobin    Standing Status:   Future    Number of Occurrences:   1    Standing Expiration Date:   12/19/2018  . Lactate dehydrogenase (LDH)    Standing Status:   Future    Number of Occurrences:   1    Standing Expiration Date:   12/19/2018  . Occult blood card to lab, stool    Standing Status:   Future    Standing Expiration Date:   12/20/2018  . Occult blood card to lab, stool    Standing Status:   Future    Standing Expiration Date:   12/20/2018  . Occult blood card to lab, stool    Standing Status:   Future    Standing Expiration Date:   12/20/2018   All questions were answered. The patient knows to call the clinic with any problems, questions or concerns. No barriers to learning was detected.    Alla Feeling, NP 12/20/17   Addendum  I have seen the patient, examined her. I agree with the assessment and and plan and have edited the notes.   Michaela Little returns for routine follow-up today.  She has developed dyspnea on exertion lately, with some chest pressure, no significant chest pain.  Her lab today showed severe pancytopenia, with hemoglobin 4.6, Pinn and moderate thrombocytopenia.  No clinical overt GI or other bleeding.  We checked reticular  count which was low, LDH was slightly elevated, haptoglobin still pending, this is unlikely hemolysis.  Her iron study today also showed high ferritin and iron level, which is against chronic GI bleeding.  I think her severe pia is likely related to [place].  We will stop it.  Unfortunately patient is a Jehovah witness, and would not take blood transfusion.  We discussed the consequence of severe anemia, such as heart attack and heart failure, pain, etc.  Commended her to consider 1 dose of Aranesp to boost her cancer recovery.  We discussed the potential side effect of Aranesp, especially stimulating tumor growth.  However, I only plan to give 1 dose, the benefit is probably out weight the risks.  Patient wants to think about it. Will let me know tomorrow.   Will monitor her CBC closely, repeat next week and see her back in 2 weeks.   Continue exemestane.   [name]  12/19/2017

## 2017-12-19 NOTE — Telephone Encounter (Signed)
Patient called requesting refill of Carvedilol sent to [facility]. Nat Christen, CMA

## 2017-12-19 NOTE — Telephone Encounter (Signed)
Gave pt avs and calendar  °

## 2017-12-19 NOTE — Patient Instructions (Signed)

## 2017-12-20 ENCOUNTER — Telehealth: Payer: Self-pay

## 2017-12-20 ENCOUNTER — Encounter: Payer: Self-pay | Admitting: Nurse Practitioner

## 2017-12-20 LAB — CANCER ANTIGEN 27.29: CAN 27.29: 504.2 U/mL — AB (ref 0.0–38.6)

## 2017-12-20 LAB — HAPTOGLOBIN: Haptoglobin: 241 mg/dL — ABNORMAL HIGH (ref 34–200)

## 2017-12-20 NOTE — Telephone Encounter (Signed)
Patient refuses to get Aranesp injection she is afraid of the side effects.

## 2017-12-20 NOTE — Telephone Encounter (Signed)
Spoke with patient per Dr. Burr Medico regardin Aranasp injection. Patient refuses to get injection, scared of the side effects.

## 2017-12-20 NOTE — Telephone Encounter (Signed)
Patient aware. Michaela Little, CMA  

## 2017-12-20 NOTE — Telephone Encounter (Signed)
-----   Message from [name], MD sent at 12/20/2017 12:05 PM EDT ----- Malachy Mood,  Could you call pt and let her know her iron level was good yesterday. I recommended Aranasp injection once, she is thinking about it, please check with her to see if she agrees with the injection tomorrow, order is in and no PA required  Thanks  Krista Blue  ----- Message ----- From: Altamese Dilling D Sent: 12/20/2017  11:27 AM EDT To: [name], MD  Yes ma'am,  Pt is good to go.  No PA required for this pt insurance.  Thanks Karena Addison ----- Message ----- From: [name], MD Sent: 12/20/2017   8:16 AM EDT To: Alden Server,  Could you get her aranesp approved today? Plan to give tomorrow  Thanks   Krista Blue

## 2017-12-23 ENCOUNTER — Emergency Department (HOSPITAL_COMMUNITY)
Admission: EM | Admit: 2017-12-23 | Discharge: 2017-12-23 | Disposition: A | Discharge status: Home / Self Care | Payer: Medicare Other | Attending: Emergency Medicine | Admitting: Emergency Medicine

## 2017-12-23 ENCOUNTER — Other Ambulatory Visit: Payer: Self-pay

## 2017-12-23 ENCOUNTER — Ambulatory Visit (HOSPITAL_BASED_OUTPATIENT_CLINIC_OR_DEPARTMENT_OTHER): Payer: Medicare Other | Admitting: Hematology

## 2017-12-23 ENCOUNTER — Emergency Department (HOSPITAL_COMMUNITY): Payer: Medicare Other

## 2017-12-23 ENCOUNTER — Encounter (HOSPITAL_COMMUNITY): Payer: Self-pay | Admitting: Emergency Medicine

## 2017-12-23 ENCOUNTER — Telehealth: Payer: Self-pay | Admitting: Nurse Practitioner

## 2017-12-23 ENCOUNTER — Inpatient Hospital Stay: Payer: Medicare Other

## 2017-12-23 DIAGNOSIS — C50812 Malignant neoplasm of overlapping sites of left female breast: Secondary | ICD-10-CM | POA: Diagnosis not present

## 2017-12-23 DIAGNOSIS — R059 Cough, unspecified: Secondary | ICD-10-CM

## 2017-12-23 DIAGNOSIS — C785 Secondary malignant neoplasm of large intestine and rectum: Secondary | ICD-10-CM

## 2017-12-23 DIAGNOSIS — Z170 Estrogen receptor positive status [ER+]: Secondary | ICD-10-CM | POA: Diagnosis not present

## 2017-12-23 DIAGNOSIS — Z853 Personal history of malignant neoplasm of breast: Secondary | ICD-10-CM | POA: Diagnosis not present

## 2017-12-23 DIAGNOSIS — Z79899 Other long term (current) drug therapy: Secondary | ICD-10-CM | POA: Insufficient documentation

## 2017-12-23 DIAGNOSIS — R0609 Other forms of dyspnea: Secondary | ICD-10-CM

## 2017-12-23 DIAGNOSIS — Z87891 Personal history of nicotine dependence: Secondary | ICD-10-CM | POA: Diagnosis not present

## 2017-12-23 DIAGNOSIS — R5383 Other fatigue: Secondary | ICD-10-CM

## 2017-12-23 DIAGNOSIS — Z91040 Latex allergy status: Secondary | ICD-10-CM | POA: Diagnosis not present

## 2017-12-23 DIAGNOSIS — C7951 Secondary malignant neoplasm of bone: Secondary | ICD-10-CM

## 2017-12-23 DIAGNOSIS — Z7189 Other specified counseling: Secondary | ICD-10-CM

## 2017-12-23 DIAGNOSIS — R05 Cough: Secondary | ICD-10-CM

## 2017-12-23 DIAGNOSIS — I10 Essential (primary) hypertension: Secondary | ICD-10-CM | POA: Insufficient documentation

## 2017-12-23 DIAGNOSIS — J45909 Unspecified asthma, uncomplicated: Secondary | ICD-10-CM | POA: Diagnosis not present

## 2017-12-23 DIAGNOSIS — D649 Anemia, unspecified: Secondary | ICD-10-CM | POA: Insufficient documentation

## 2017-12-23 DIAGNOSIS — D61818 Other pancytopenia: Secondary | ICD-10-CM

## 2017-12-23 DIAGNOSIS — C50912 Malignant neoplasm of unspecified site of left female breast: Secondary | ICD-10-CM

## 2017-12-23 LAB — COMPREHENSIVE METABOLIC PANEL
ALK PHOS: 136 U/L — AB (ref 38–126)
ALT: 17 U/L (ref 0–44)
ANION GAP: 7 (ref 5–15)
AST: 27 U/L (ref 15–41)
Albumin: 3.8 g/dL (ref 3.5–5.0)
BUN: 12 mg/dL (ref 6–20)
CALCIUM: 9 mg/dL (ref 8.9–10.3)
CHLORIDE: 105 mmol/L (ref 98–111)
CO2: 25 mmol/L (ref 22–32)
Creatinine, Ser: 0.82 mg/dL (ref 0.44–1.00)
GFR calc non Af Amer: >60 mL/min (ref >60)
Glucose, Bld: 116 mg/dL — ABNORMAL HIGH (ref 70–99)
Potassium: 4 mmol/L (ref 3.5–5.1)
Sodium: 137 mmol/L (ref 135–145)
Total Bilirubin: 2.6 mg/dL — ABNORMAL HIGH (ref 0.3–1.2)
Total Protein: 7.5 g/dL (ref 6.5–8.1)

## 2017-12-23 LAB — CBC WITH DIFFERENTIAL/PLATELET
Basophils Absolute: 0 10*3/uL (ref 0.0–0.1)
Basophils Relative: 2 %
Eosinophils Absolute: 0 10*3/uL (ref 0.0–0.5)
Eosinophils Relative: 1 %
HEMATOCRIT: 14.3 % — AB (ref 34.8–46.6)
HEMOGLOBIN: 4.7 g/dL — AB (ref 11.6–15.9)
Lymphocytes Relative: 48 %
Lymphs Abs: 0.8 10*3/uL — ABNORMAL LOW (ref 0.9–3.3)
MCH: 27.6 pg (ref 25.1–34.0)
MCHC: 32.9 g/dL (ref 31.5–36.0)
MCV: 84.1 fL (ref 79.5–101.0)
Monocytes Absolute: 0.1 10*3/uL (ref 0.1–0.9)
Monocytes Relative: 8 %
NEUTROS ABS: 0.7 10*3/uL — AB (ref 1.5–6.5)
NEUTROS PCT: 41 %
Platelets: 48 10*3/uL — ABNORMAL LOW (ref 145–400)
RBC: 1.7 MIL/uL — ABNORMAL LOW (ref 3.70–5.45)
RDW: 20.4 % — ABNORMAL HIGH (ref 11.2–14.5)
WBC: 1.6 10*3/uL — ABNORMAL LOW (ref 3.9–10.3)

## 2017-12-23 LAB — RETICULOCYTES
RBC.: 1.7 MIL/uL — AB (ref 3.70–5.45)
RETIC COUNT ABSOLUTE: 35.7 10*3/uL (ref 33.7–90.7)
Retic Ct Pct: 2.1 % (ref 0.7–2.1)

## 2017-12-23 LAB — I-STAT BETA HCG BLOOD, ED (NOT ORDERABLE)

## 2017-12-23 LAB — POCT I-STAT TROPONIN I: Troponin i, poc: 0 ng/mL (ref 0.00–0.08)

## 2017-12-23 LAB — OCCULT BLOOD, POC DEVICE: Fecal Occult Bld: NEGATIVE

## 2017-12-23 LAB — LACTATE DEHYDROGENASE: LDH: 409 U/L — ABNORMAL HIGH (ref 98–192)

## 2017-12-23 MED ORDER — ALBUTEROL SULFATE HFA 108 (90 BASE) MCG/ACT IN AERS
2.0000 | INHALATION_SPRAY | Freq: Four times a day (QID) | RESPIRATORY_TRACT | Status: DC
  Administered 2017-12-23: 2 via RESPIRATORY_TRACT
  Filled 2017-12-23: qty 6.7

## 2017-12-23 MED ORDER — DARBEPOETIN ALFA 300 MCG/0.6ML IJ SOSY
PREFILLED_SYRINGE | INTRAMUSCULAR | Status: AC
  Filled 2017-12-23: qty 0.6

## 2017-12-23 MED ORDER — ALBUTEROL SULFATE (2.5 MG/3ML) 0.083% IN NEBU: 5.0000 mg | INHALATION_SOLUTION | Freq: Once | RESPIRATORY_TRACT | Status: DC

## 2017-12-23 MED ORDER — DARBEPOETIN ALFA 300 MCG/0.6ML IJ SOSY
300.0000 ug | PREFILLED_SYRINGE | Freq: Once | INTRAMUSCULAR | Status: AC
  Administered 2017-12-23: 300 ug via SUBCUTANEOUS

## 2017-12-23 NOTE — Progress Notes (Signed)
[place]  [phone] [phone]  Clinic Follow up Note   Patient Care Team: [name] as PCP - General (Physician Assistant) [name], MD as Consulting Physician (Pulmonary Disease) [name], MD as Consulting Physician (Hematology)   Date of Service:  12/23/2017   CHIEF COMPLAINT: F/U after ED visit today   SUMMARY OF ONCOLOGIC HISTORY: Oncology History   Cancer Staging Malignant neoplasm of overlapping sites of left breast in female, estrogen receptor positive ([place]) Staging form: Breast, AJCC 7th Edition - Clinical: Stage IIIA (T2, N2, M0) - Unsigned       Malignant neoplasm of overlapping sites of left breast in female, estrogen receptor positive ([place])   05/2008 Cancer Diagnosis    Left sided breast cancer (no path report available)    11/2008 Pathologic Stage    Stage IIIA: T2 N2     Neo-Adjuvant Chemotherapy    Paclitaxel weekly x 12; doxorubicin and cyclophosphamide x 4 - both given with trifiparnib (treated at [facility] in [place])    11/2008 Definitive Surgery    Left modified radiation mastectomy: invasive lobular carcinoma, ER+, PR+, HER2/neu negative, 5/22 LN positive     - 03/2009 Radiation Therapy    Adjuvant radiation to left chest wall    2011 - 08/2014 Anti-estrogen oral therapy    Tamoxifen 20 mg used briefly; discontinued due to uterine lining concerns; changed to anastrozole 1 mg daily (began 07/2100); discontinued by patient summer of 2016    05/26/2015 Progression    Iliac bone biopsy showed metastatic carcinoma consistent with a breast primary, ER positive, PR and HER-2 negative. Her staging CT, and a PET scan was negative for visceral metastasis.    05/2015 -  Anti-estrogen oral therapy    1. letrozole and Ibrance started March 2017             -Ibrance dose decreased to 75 mg daily  -Letrozole switched to Exemestane on 02/13/17 due to b/l rib pain.     11/16/2015 Miscellaneous    Xgeva  monthly for bone metastasis     07/03/2016 Imaging    CT chest, abdomen and pelvis with contrast showed no evidence of metastasis.     02/07/2017 Imaging    CT AP W Contrast 02/07/17 IMPRESSION: 1. No CT findings for abdominal/pelvic metastatic disease. 2. No acute abdominal findings, mass lesions or adenopathy. 3. Status post cholecystectomy with mild associated common bile duct dilatation. 4. Somewhat thickened appendix appears relatively stable. No acute inflammatory process. 5. Stable mixed lytic and sclerotic osseous metastatic disease.     02/07/2017 Imaging    Bone Scan Whole Body 02/07/17 IMPRESSION: No scintigraphic evidence skeletal metastasis     08/12/2017 Imaging    Whole Boday Scan 08/12/17 IMPRESSION: Scattered degenerative type uptake as above with a questionable focus of increased tracer localization at L2 vertebral body versus artifact; no abnormality is seen at this site by CT. Consider either characterization by MR or attention on follow-up imaging.    08/12/2017 Imaging    CT AP W Contrast 08/12/17  IMPRESSION: Continued thickening of the appendix is noted without surrounding inflammation. It has maximum measured diameter is decreased compared to prior exam. There is no evidence of acute inflammation. Stable mixed lytic and sclerotic appearance of visualized skeleton is noted consistent with history of known osseous metastases. No acute abnormality seen in the abdomen or pelvis.    08/22/2017 Surgery    APPENDECTOMY LAPAROSCOPIC ERAS PATHWAY by Dr.  Blackaman and REMOVAL OF LEFT TISSUE EXPANDER by Dr. Harlow Mares 08/22/17    08/22/2017 Pathology Results    Diagnosis 08/22/17  1. Appendix, Other than Incidental - METASTATIC CARCINOMA, CONSISTENT WITH BREAST PRIMARY. - CARCINOMA IS PRESENT AT THE SURGICAL RESECTION MARGIN AS WELL AS THE SEROSAL SURFACE. - LYMPHOVASCULAR INVASION IS IDENTIFIED. - SEE COMMENT. 2. Breast, capsule, Left - DENSE FIBROUS TISSUE WITH  MILD INFLAMMATION, INCLUDING A FEW SCATTERED MULTINUCLEATED GIANT CELLS. - THERE IS NO EVIDENCE OF MALIGNANCY.    08/27/2017 Imaging    CT AP W Contrast 08/27/17 IMPRESSION: No evidence of pulmonary embolus. Small pericardial effusion with uncertain clinical significance. No evidence of acute abnormalities within the chest, abdomen or pelvis. Area of fat stranding and small focus of gas within the anterior abdominal wall, immediately inferior to the umbilicus, probably at the site of laparoscopic access.    10/07/2017 Echocardiogram    10/07/2017 ECO Study Conclusions  - Procedure narrative: Transthoracic echocardiography. Image   quality was adequate. The study was technically difficult, as a   result of poor acoustic windows. - Left ventricle: The cavity size was normal. Wall thickness was   normal. Systolic function was normal. The estimated ejection   fraction was in the range of 60% to 65%. Wall motion was normal;   there were no regional wall motion abnormalities. Features are   consistent with a pseudonormal left ventricular filling pattern,   with concomitant abnormal relaxation and increased filling   pressure (grade 2 diastolic dysfunction).     INTERIM HISTORY: 01/17/17 Michaela Little is here for a follow up of her metastatic breast cancer. I am taking over her care from Dr. Jana Hakim. This is my first time seeing her.   Today she is on her off week of Ibrance and is on 88m. She notes she feels exhausted and is often SOB. She has been having right sided abdominal pain that was worse 2 weeks ago, she say symptom management clinic and ER. She has been having ongoing pain in the right groin area but worsened lately. She is also having pain around her left lower ribs. She has not tried gabapentin and she takes tramadol as needed for pain. Right now her pain is tolerable. Sometime her pain can be past 10/10 but now the pain is 6-7/10 She is taking her BP medication for her HTN,  she started Flovent due to her current trouble breathing, given by Dr. WMelvyn Novas Her legs swell but no doppler has been done before. She also gets a lot of fluid in her stomach. She is also on Lasix, given by her PCP, Dr. GAltamease Oiler She has arthritis. She still has tissue expander in left breast where she has some pain and tenderness. Her PCP changed her heart medication because her heart rate was slow when he last saw her. She would like to get to the bottom of her issues as she has difficulty falling asleep at night.  She lives by herself in a community residence[place] She has 5 children. She does not drive. She does have SCAT forms to be filled out.    CURRENT TREATMENT:    1. letrozole and Ibrance started March 2017   -Ibrance dose decreased to 75 mg daily, held on 06/03/2017 due to surgery, restarted around 09/19/17, stopped on 12/19/2017 due to severe pancytopenia    -Letrozole switched to Exemestane on 02/13/17 due to b/l rib pain.  2. Xgeva started 11/16/2015, repeated monthly   INTERVAL HISTORY:  Michaela Little is here after ED visit today.  She was seen by my nurse practitioner Bella Kennedy in the me last week, and was found to have severe cytopenia with hemoglobin 4.6.  She is a Sales promotion account executive Witness, and declined blood transfusion.  I recommend Aranesp injection, and she also declined due to the concern of negative impact on her cancer.  She denies clinical signs of bleeding.  She reports worsening fatigue, and chest tightness this morning, and presented to the emergency room.  She has also been having cough with mild clear mucus production.  No fever.  She has dyspnea on mild-to-moderate, likely related to her severe anemia. No other new symptoms.      REVIEW OF SYSTEMS:  Constitutional: Denies fevers, chills or abnormal weight loss (+) uses walker, more fatigue lately  Eyes: Denies blurriness of vision Ears, nose, mouth, throat, and  face: Denies mucositis, sore throat  Respiratory: (+) Cough with mild clear mucus production, dyspnea on mild-to-moderate exertion. Cardiovascular: Denies palpitation, (+) chest tightness today, no chest pain.  (+) Slight lower leg edema Gastrointestinal:  Denies nausea, heartburn  (+) shooting RUQ abdominal pain and flank pain, constipation, and bloating MSK: (+) back pain, stable Breast: (+) she feels a lump on her right lower midaxillary area Skin: Denies abnormal skin rashes Lymphatics: Denies new lymphadenopathy or easy bruising Neurological:Denies numbness, tingling or new weaknesses Behavioral/Psych: Mood is stable, no new changes  All other systems were reviewed with the patient and are negative.  MEDICAL HISTORY:  Past Medical History:  Diagnosis Date  . Anxiety   . Arthritis    hands, knees, hips  . Asthma   . Bronchitis   . Cancer ([place])   . Cervical stenosis (uterine cervix)   . Disorder of appendix    Enlarged  . Dyspnea on exertion   . GERD (gastroesophageal reflux disease)   . Headache(784.0)   . History of breast cancer    2010--  LEFT  s/p  mastectomy (in [place]) AND CHEMORADIATION--  NO RECURRENCE  . History of cervical dysplasia   . Hyperlipidemia   . Hypertension   . Malignant neoplasm of overlapping sites of left breast in female, estrogen receptor positive ([place]) 03/05/2013  . OSA (obstructive sleep apnea) moderate osa per study  09/2010   CPAP  , NOT USING ON REGULAR BASIS  . Pelvic pain in female   . Positive H. pylori test    08-05-2013  . Positive TB test    AS TEEN--  [place] W/ MEDS  . Refusal of blood transfusions as patient is Jehovah's Witness   . Seasonal allergies   . Uterine fibroid   . Wears glasses     SURGICAL HISTORY: Past Surgical History:  Procedure Laterality Date  . APPENDECTOMY  y  . CARDIOVASCULAR STRESS TEST  10-29-2012   low risk perfusion study/  no significant reversibity/ ef 66%/  normal wall motion  . CERVICAL CONIZATION W/BX   2012   in  [place] N/A 05/14/2012   Procedure: LAPAROSCOPIC CHOLECYSTECTOMY;  Surgeon: Ralene Ok, MD;  Location: [facility];  Service: General;  Laterality: N/A;  . COLONOSCOPY  2011   normal per patient - [place]  . DILATION AND CURETTAGE OF UTERUS  10/15/2011   Procedure: DILATATION AND CURETTAGE;  Surgeon: Melina Schools, MD;  Location: [facility];  Service: Gynecology;  Laterality: N/A;  Conization &  endocervical curettings  .  EXAMINATION UNDER ANESTHESIA N/A 08/20/2013   Procedure: EXAM UNDER ANESTHESIA;  Surgeon: Margarette Asal, MD;  Location: [facility];  Service: Gynecology;  Laterality: N/A;  . [place]   right  . LAPAROSCOPIC APPENDECTOMY N/A 08/22/2017   Procedure: [place];  Surgeon: Coralie Keens, MD;  Location: [facility];  Service: General;  Laterality: N/A;  . LAPAROSCOPIC ASSISTED VAGINAL HYSTERECTOMY N/A 10/26/2014   Procedure: HYSTERECTOMY ABDOMINAL ;  Surgeon: Molli Posey, MD;  Location: [facility];  Service: Gynecology;  Laterality: N/A;  . MASTECTOMY Left 11/2008  in [place]   . SALPINGOOPHORECTOMY Bilateral 10/26/2014   Procedure: BILATERAL SALPINGO OOPHORECTOMY;  Surgeon: Molli Posey, MD;  Location: [facility];  Service: Gynecology;  Laterality: Bilateral;  . TISSUE EXPANDER PLACEMENT Left 08/22/2017   Procedure: REMOVAL OF LEFT TISSUE EXPANDER;  Surgeon: Crissie Reese, MD;  Location: [facility];  Service: Plastics;  Laterality: Left;  . TISSUE EXPANDER REMOVAL Left 08/22/2017  . TRANSTHORACIC ECHOCARDIOGRAM  10-29-2012   mild lvh/  ef 60-65%/  grade II diastolic dysfunction/  trivial mr  &  tr    I have reviewed the social history and family history with the patient and they are unchanged from previous note.  ALLERGIES:  is allergic to lisinopril-hydrochlorothiazide; adhesive [tape]; fentanyl; gabapentin; hctz [hydrochlorothiazide]; latex; lisinopril; losartan potassium; other; and  oxycodone.  MEDICATIONS:  Current Outpatient Medications  Medication Sig Dispense Refill  . amLODipine (NORVASC) 10 MG tablet Take 1 tablet (10 mg total) by mouth daily. 90 tablet 3  . benzonatate (TESSALON) 100 MG capsule Take 1 capsule (100 mg total) by mouth 2 (two) times daily as needed for cough. (Patient not taking: Reported on 12/23/2017) 30 capsule 0  . carvedilol (COREG) 6.25 MG tablet Take 1 tablet (6.25 mg total) by mouth 2 (two) times daily with a meal. 180 tablet 3  . cetirizine (ZYRTEC) 10 MG tablet Take 10 mg by mouth daily.  11  . exemestane (AROMASIN) 25 MG tablet Take 1 tablet (25 mg total) by mouth daily after breakfast. 90 tablet 1  . fluticasone furoate-vilanterol (BREO ELLIPTA) 100-25 MCG/INH AEPB Inhale 1 puff into the lungs daily. 28 each 5  . furosemide (LASIX) 20 MG tablet Take 1 tablet (20 mg total) by mouth daily as needed (for fluid retention.). 30 tablet 5  . IBRANCE 75 MG capsule TAKE ONE CAPSULE BY MOUTH DAILY WITH FOOD FOR 21 DAYS OF A 28 DAY CYCLE. SWALLOW WHOLE. DO NOT TAKE IF CAPSULES ARE BROKEN OR CRACKED. AVOID (Patient not taking: Take with food for 21 days of a 28 day cycle) 21 capsule 1  . ipratropium-albuterol (DUONEB) 0.5-2.5 (3) MG/3ML SOLN Take 3 mLs by nebulization every 6 (six) hours as needed. 360 mL 5  . loratadine (CLARITIN) 10 MG tablet Take 20 mg by mouth daily.     . montelukast (SINGULAIR) 10 MG tablet Take 1 tablet (10 mg total) by mouth at bedtime. 30 tablet 5  . naproxen (NAPROSYN) 500 MG tablet Take 1 tablet (500 mg total) by mouth 2 (two) times daily with a meal. 30 tablet 0  . pantoprazole (PROTONIX) 40 MG tablet Take 1 tablet (40 mg total) by mouth daily. Take 30-60 min before first meal of the day (Patient not taking: Reported on 12/23/2017) 30 tablet 2  . predniSONE (DELTASONE) 10 MG tablet 2 tabs daily x 5 days (Patient not taking: Reported on 12/23/2017) 10 tablet 0  . promethazine (  PHENERGAN) 25 MG tablet Take 1 tablet (25 mg total) by  mouth every 6 (six) hours as needed for nausea or vomiting. 30 tablet 0  . ranitidine (ZANTAC) 300 MG tablet Take 1 tablet (300 mg total) by mouth at bedtime. (Patient taking differently: Take 300 mg by mouth at bedtime as needed (for indigestion.). ) 30 tablet 5  . traMADol (ULTRAM) 50 MG tablet Take 1-2 tablets (50-100 mg total) by mouth every 6 (six) hours as needed for moderate pain or severe pain. 30 tablet 0   No current facility-administered medications for this visit.     PHYSICAL EXAMINATION:  ECOG PERFORMANCE STATUS: 3 GENERAL:alert, no distress and comfortable (+) obese, uses walker SKIN: appears to be pale  EYES: normal, Conjunctiva are pink and non-injected, sclera clear OROPHARYNX:no exudate, no erythema and lips, buccal mucosa, and tongue normal  NECK: supple, thyroid normal size, non-tender, (+)palpable right thyroid nodule  LYMPH:  no palpable lymphadenopathy in the cervical, axillary or inguinal LUNGS: clear to auscultation and percussion with normal breathing effort HEART: regular rate & rhythm and no murmurs and no lower extremity edema ABDOMEN:abdomen soft, non-tender and normal bowel sounds   Musculoskeletal:no cyanosis of digits and no clubbing  NEURO: alert & oriented x 3 with fluent speech, no focal motor/sensory deficits Breast: exam deferred    LABORATORY DATA:  I have reviewed the data as listed CBC Latest Ref Rng & Units 12/23/2017 12/19/2017 11/18/2017  WBC 3.9 - 10.3 K/uL 1.6(L) 1.6(L) 1.6(L)  Hemoglobin 11.6 - 15.9 g/dL 4.7(LL) 4.6(LL) 7.9(L)  Hematocrit 34.8 - 46.6 % 14.3(L) 14.2(L) 23.8(L)  Platelets 145 - 400 K/uL 48(L) 49(L) 99(L)     CMP Latest Ref Rng & Units 12/23/2017 12/19/2017 11/18/2017  Glucose 70 - 99 mg/dL 116(H) 130(H) 112(H)  BUN 6 - 20 mg/dL _0 Creatinine 0.44 - 1.00 mg/dL 0.82 0.80 0.93  Sodium 135 - 145 mmol/L 137 140 141  Potassium 3.5 - 5.1 mmol/L 4.0 3.9 3.9  Chloride 98 - 111 mmol/L 105 109 108  CO2 22 - 32 mmol/L _1 Calcium 8.9 - 10.3 mg/dL 9.0 9.0 9.9  Total Protein 6.5 - 8.1 g/dL 7.5 7.0 7.7  Total Bilirubin 0.3 - 1.2 mg/dL 2.6(H) 1.4(H) 1.2  Alkaline Phos 38 - 126 U/L 136(H) 130(H) 138(H)  AST 15 - 41 U/L 27 27 50(H)  ALT 0 - 44 U/L 17 18 46(H)    PATHOLOGY Diagnosis 08/22/17  1. Appendix, Other than Incidental - METASTATIC CARCINOMA, CONSISTENT WITH BREAST PRIMARY. - CARCINOMA IS PRESENT AT THE SURGICAL RESECTION MARGIN AS WELL AS THE SEROSAL SURFACE. - LYMPHOVASCULAR INVASION IS IDENTIFIED. - SEE COMMENT. 2. Breast, capsule, Left - DENSE FIBROUS TISSUE WITH MILD INFLAMMATION, INCLUDING A FEW SCATTERED MULTINUCLEATED GIANT CELLS. - THERE IS NO EVIDENCE OF MALIGNANCY. Microscopic Comment 1. Grossly, the appendiceal wall is thickened. Histologic evaluation reveals diffuse involvement by a malignant process, consisting of small relatively uniform discohesive tumor cells involving the submucosa as well as the periappendiceal soft tissue, including the serosal surface. Immunohistochemical stains were performed revealing that the tumor cells are positive for cytokeratin 7, GATA-3, estrogen receptor and focally positive for GCDFP. Tumor cells are negative for CD56, cytokeratin 20, chromogranin and synaptophysin. A Ki-67 stain is positive in approximately 70% of the tumor cells. Overall, the findings are consistent with metastatic carcinoma of breast primary and may be a lobular phenotype. A complete breast prognostic profile will be performed and the results reported separately. Dr.  Vicente Males has reviewed selected slides and concurs with this interpretation. (JBK:kh 08-25-17)    PROCEDURES 10/07/2017 ECO Study Conclusions  - Procedure narrative: Transthoracic echocardiography. Image   quality was adequate. The study was technically difficult, as a   result of poor acoustic windows. - Left ventricle: The cavity size was normal. Wall thickness was   normal. Systolic function was normal. The  estimated ejection   fraction was in the range of 60% to 65%. Wall motion was normal;   there were no regional wall motion abnormalities. Features are   consistent with a pseudonormal left ventricular filling pattern,   with concomitant abnormal relaxation and increased filling   pressure (grade 2 diastolic dysfunction).  ECHO 01/22/17 Study Conclusions - Left ventricle: The cavity size was normal. There was moderate concentric hypertrophy. Systolic function was normal. The estimated ejection fraction was in the range of 55% to 60%. Wall motion was normal; there were no regional wall motion abnormalities. Doppler parameters are consistent with abnormal left ventricular relaxation (grade 1 diastolic dysfunction).   RADIOGRAPHIC STUDIES: I have personally reviewed the radiological images as listed and agreed with the findings in the report. Dg Chest 2 View  Result Date: 12/23/2017 CLINICAL DATA:  Anemia.  Tachycardia.  Cough. EXAM: CHEST - 2 VIEW COMPARISON:  October 18, 2017 FINDINGS: There is no edema or consolidation. Heart size and pulmonary vascularity are normal. No adenopathy. Patient is status post left mastectomy with surgical clips in left axillary region. No blastic or lytic bone lesions evident. IMPRESSION: Status post left mastectomy. No edema or consolidation. No evident adenopathy. Electronically Signed   By: Lowella Grip III M.D.   On: 12/23/2017 11:50      CT AP W Contrast 08/27/17 IMPRESSION: No evidence of pulmonary embolus. Small pericardial effusion with uncertain clinical significance. No evidence of acute abnormalities within the chest, abdomen or pelvis. Area of fat stranding and small focus of gas within the anterior abdominal wall, immediately inferior to the umbilicus, probably at the site of laparoscopic access.   Whole Body Scan 08/12/17 IMPRESSION: Scattered degenerative type uptake as above with a questionable focus of increased tracer localization at  L2 vertebral body versus artifact; no abnormality is seen at this site by CT. Consider either characterization by MR or attention on follow-up imaging.   CT AP W Contrast 08/12/17  IMPRESSION: Continued thickening of the appendix is noted without surrounding inflammation. It has maximum measured diameter is decreased compared to prior exam. There is no evidence of acute inflammation. Stable mixed lytic and sclerotic appearance of visualized skeleton is noted consistent with history of known osseous metastases. No acute abnormality seen in the abdomen or pelvis.   CT AP W Contrast 02/07/17 IMPRESSION: 1. No CT findings for abdominal/pelvic metastatic disease. 2. No acute abdominal findings, mass lesions or adenopathy. 3. Status post cholecystectomy with mild associated common bile duct dilatation. 4. Somewhat thickened appendix appears relatively stable. No acute inflammatory process. 5. Stable mixed lytic and sclerotic osseous metastatic disease.   Bone Scan Whole Body 02/07/17 IMPRESSION: No scintigraphic evidence skeletal metastasis    ASSESSMENT & PLAN:  SHERRAL DIROCCO is a 57 y.o. female with a history of HTN, HLD, Arthritis, Asthma, Sleep Apnea, and H. Pylori.  1. Severe pancytopenia, likely secondary to [facility] -She was found to have ANC 0.7, hemoglobin 4.7, platelet 48 on routine follow-up on December 19, 2017.  Lab work-up was negative for hemolysis, or iron deficiency, stool OB was negative. -This is likely  related to [facility], which I have stopped on December 19, 2017. -Is a Jehovah's Witness, will not take any blood products. -I strongly encouraged her to consider Aranesp injection every 2 weeks, to speed up her recovery.  Potential and potential side effects discussed with her again, especially the risk of stimulating tumor gross  (which is very low given the short course of injection), after extensive discussion with her again today, she has agreed.   -We will  start Aranesp injection 300 mcg every 6, and to her hemoglobin above 8-9  -she is very somatic from her anemia.  She knows to avoid exertion.  -Neutropenia precaution reviewed with her  2. 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 -She has metastatic ER positive breast cancer to bones, has been on letrozole and Ibrance since March 2017. She has had her oncological care both in [place] and [place], she had a PET scan in December 2017 and  CT scan in April 2018. Due to her worsening bilateral rib cage pain, I obtained restaging CT abdomen and pelvis with contrast, and bone scan on 02/07/2017. I previously reviewed the scan findings with pt, which showed stable lipomatosis, not hypermetabolic on bone scan, no other new lesions.  No evidence of disease progression on the restaging scan -Given her increase b/l rib cage pain, possible related to her letrozole along with her bone mets, I switched her to Exemestane (02/13/17) and her bone pain improved.  -She underwent appendectomy by Dr. Ninfa Linden and breast reconstruction to remove her breast implant by Dr. Harlow Mares on 08/22/17. The pathology from her surgery showed evidence of metastatic breast cancer in her appendix, ER positive/PR and HER2 negative. I recommend she continue current regimen. She can restart Ibrance once she recovers from surgery .  -I previously discussed her goal of therapy is palliative and she will continue on this for as long as she can tolerate of her disease progresses.  -We previously reviewed and discussed her CT AP and bone scan from 08/12/17 which shows no PE, but shows mild pericardial effusion. No current need for thoracentesis, will monitor.  -Bone scan shows Scattered degenerative type uptake at L2 vertebral body versus artifact; no abnormality is seen at this site by CT. Pericardial effusion is new  from previous scan, echo on 03/09/2018 showed no pericardial effusion. Normal EF, grade 2 diastolic dysfunction. She was previously referred to cardiology by her primary care physician. -Due to her severe pancytopenia, [name] was pulmonary stopped on December 19, 2017 -Continue exemestane  -consider repeating scan in 1-2 months  -will check PI3K gene mutation in her tumor   3. Primary breast cancer of left breast overlapping sites diagnosed March of 2010 and treated neo-adjuvantly at [facility] with (a) paclitaxel weekly x12 and (b) doxorubicin/ cyclophosphamide in dose dense fashion x4, both given with tifiparnib.  -definitive left modified radical mastectomy September of 2010 for a T2 N2 or stage IIIA invasive lobular breast cancer, estrogen and progesterone receptor positive, HER-2 negative,  -post mastectomy radiation completed January of 2011, -briefly on tamoxifen, discontinued it because of uterine lining concerns.  On anastrozole since May 2012 with good tolerance. Held between November and December 2013 due to GI issues, unrelated to cancer or its treatment. Discontinued by the patient summer of 2016  4. Bilateral rib cage pain, Left chest wall pain  -She takes Tylenol and tramadol as needed.  -Bone  pain previously increased to bilateral ribs. I have switched her letrozole to Exemestane. Her pain has improved. -She previously declined narcotics, but I suggested she continue to take tylenol, Advil or aleve for her pain as we want to help her control her pain.  -I refilled her Tramadol previously, she had left chest wall pain after the implant was removed  -she did PT   5. Lower Extremity Edema -She is on Lasix, given by her PCP Dr. Altamease Oiler -I previously suggested compression socks and advices her to elevate her feet when sitting at home.  -Repeat echocardiogram in 12/2016 and 09/2017 shows normal EF  6. Dyspnea, Cough -Uses Flovent -she will f/u with pulmonologist Dr. Melvyn Novas   -worse  lately due to her severe anemia   7. Insomnia  -She is on hydroxyzine already  -I suggested her to try melatonin previously -She was given gabapentin in the past, I previously advised if it is not working for her she can stop.   8. 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 Ninfa Linden on 08/22/17 which was found to be metastasis of her breast cancer.   9. Goal of care discussion  -We discussed the incurable nature of her cancer, and the overall poor prognosis, especially if she does not have good response to chemotherapy or progress on chemo -The patient understands the goal of care is palliative. -she is full code now   PLAN: -start Aranesp injection 300 mcg every 2 weeks today  -lab in one week (may need home care service) -lab, f/u and injection in 2 weeks    No orders of the defined types were placed in this encounter.  All questions were answered. The patient knows to call the clinic with any problems, questions or concerns. No barriers to learning was detected.  I spent 20 minutes counseling the patient face to face. The total time spent in the appointment was 25 minutes and more than 50% was on counseling and review of test results  I, Noor Dweik am acting as scribe for Dr. [name].  I have reviewed the above documentation for accuracy and completeness, and I agree with the above.     [name], MD 12/23/2017 2:10 PM

## 2017-12-23 NOTE — Discharge Instructions (Addendum)
Please be sure to proceed to the cancer center to receive your injection. Please use the provided albuterol inhaler now, and every 6 hours for the next few days for additional relief.

## 2017-12-23 NOTE — ED Notes (Signed)
ISTAT machine not crossing over patient resulted  Troponin 0.00 BhCG <5.0

## 2017-12-23 NOTE — Patient Instructions (Signed)
Darbepoetin Alfa injection (Aranesp) What is this medicine? DARBEPOETIN ALFA (dar be POE e tin AL fa) helps your body make more red blood cells. It is used to treat anemia caused by chronic kidney failure and chemotherapy. This medicine may be used for other purposes; ask your health care provider or pharmacist if you have questions. COMMON BRAND NAME(S): Aranesp What should I tell my health care provider before I take this medicine? They need to know if you have any of these conditions: -blood clotting disorders or history of blood clots -cancer patient not on chemotherapy -cystic fibrosis -heart disease, such as angina, heart failure, or a history of a heart attack -hemoglobin level of 12 g/dL or greater -high blood pressure -low levels of folate, iron, or vitamin B12 -seizures -an unusual or allergic reaction to darbepoetin, erythropoietin, albumin, hamster proteins, latex, other medicines, foods, dyes, or preservatives -pregnant or trying to get pregnant -breast-feeding How should I use this medicine? This medicine is for injection into a vein or under the skin. It is usually given by a health care professional in a hospital or clinic setting. If you get this medicine at home, you will be taught how to prepare and give this medicine. Use exactly as directed. Take your medicine at regular intervals. Do not take your medicine more often than directed. It is important that you put your used needles and syringes in a special sharps container. Do not put them in a trash can. If you do not have a sharps container, call your pharmacist or healthcare provider to get one. A special MedGuide will be given to you by the pharmacist with each prescription and refill. Be sure to read this information carefully each time. Talk to your pediatrician regarding the use of this medicine in children. While this medicine may be used in children as young as 1 year for selected conditions, precautions do  apply. Overdosage: If you think you have taken too much of this medicine contact a poison control center or emergency room at once. NOTE: This medicine is only for you. Do not share this medicine with others. What if I miss a dose? If you miss a dose, take it as soon as you can. If it is almost time for your next dose, take only that dose. Do not take double or extra doses. What may interact with this medicine? Do not take this medicine with any of the following medications: -epoetin alfa This list may not describe all possible interactions. Give your health care provider a list of all the medicines, herbs, non-prescription drugs, or dietary supplements you use. Also tell them if you smoke, drink alcohol, or use illegal drugs. Some items may interact with your medicine. What should I watch for while using this medicine? Your condition will be monitored carefully while you are receiving this medicine. You may need blood work done while you are taking this medicine. What side effects may I notice from receiving this medicine? Side effects that you should report to your doctor or health care professional as soon as possible: -allergic reactions like skin rash, itching or hives, swelling of the face, lips, or tongue -breathing problems -changes in vision -chest pain -confusion, trouble speaking or understanding -feeling faint or lightheaded, falls -high blood pressure -muscle aches or pains -pain, swelling, warmth in the leg -rapid weight gain -severe headaches -sudden numbness or weakness of the face, arm or leg -trouble walking, dizziness, loss of balance or coordination -seizures (convulsions) -swelling of the ankles, feet,   hands -unusually weak or tired Side effects that usually do not require medical attention (report to your doctor or health care professional if they continue or are bothersome): -diarrhea -fever, chills (flu-like symptoms) -headaches -nausea, vomiting -redness,  stinging, or swelling at site where injected This list may not describe all possible side effects. Call your doctor for medical advice about side effects. You may report side effects to FDA at 1-800-FDA-1088. Where should I keep my medicine? Keep out of the reach of children. Store in a refrigerator between 2 and 8 degrees C (36 and 46 degrees F). Do not freeze. Do not shake. Throw away any unused portion if using a single-dose vial. Throw away any unused medicine after the expiration date. NOTE: This sheet is a summary. It may not cover all possible information. If you have questions about this medicine, talk to your doctor, pharmacist, or health care provider.  2018 Elsevier/Gold Standard (2015-10-31 19:52:26)  

## 2017-12-23 NOTE — Telephone Encounter (Signed)
TC from the lab informing of critical HGB 4.7 on Michaela Little. [name] NP informed Pt. Currently at the ER.

## 2017-12-23 NOTE — ED Triage Notes (Signed)
Pt reports that on Friday she was seen at clinic and Hgb was 4.6, pt refuses blood transfusions. Reports over the weekend her heart started racing and developed productive cough. Pt reports body aches.

## 2017-12-23 NOTE — ED Provider Notes (Signed)
[facility] DEPT Provider Note   CSN: [id number] Arrival date & time: 12/23/17  1107     History   Chief Complaint Chief Complaint  Patient presents with  . Tachycardia  . Cough    HPI Michaela Little is a 57 y.o. female.  HPI  Presents with concern of fatigue and cough. Patient has ongoing therapy for breast cancer, is taking chemotherapy, has had worsening fatigue for some time, worse over the past few days. Notably, the patient has had anemia, identified as critical 4 days ago, and on repeat labs this morning identified as persistently abnormal. She denies syncope, confusion, disorientation, fever Is on no clear when the cough began. She does have a history of asthma as a pregnant lady, but not recently.   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  Past Medical History:  Diagnosis Date  . Anxiety   . Arthritis    hands, knees, hips  . Asthma   . Bronchitis   . Cancer ([place])   . Cervical stenosis (uterine cervix)   . Disorder of appendix    Enlarged  . Dyspnea on exertion   . GERD (gastroesophageal reflux disease)   . Headache(784.0)   . History of breast cancer    2010--  LEFT  s/p  mastectomy (in [place]) AND CHEMORADIATION--  NO RECURRENCE  . History of cervical dysplasia   . Hyperlipidemia   . Hypertension   . Malignant neoplasm of overlapping sites of left breast in female, estrogen receptor positive ([place]) 03/05/2013  . OSA (obstructive sleep apnea) moderate osa per study  09/2010   CPAP  , NOT USING ON REGULAR BASIS  . Pelvic pain in female   . Positive H. pylori test    08-05-2013  . Positive TB test    AS TEEN--  [place] W/ MEDS  . Refusal of blood transfusions as patient is Jehovah's Witness   . Seasonal allergies   . Uterine fibroid   . Wears glasses     Patient Active Problem List   Diagnosis Date Noted  . Asthma exacerbation 10/18/2017  . High risk medication use  09/25/2017  . Lower extremity edema 09/25/2017  . S/P laparoscopic appendectomy 08/22/2017  . Goals of care, counseling/discussion 01/20/2017  . Multinodular thyroid 02/08/2016  . Lumbar back pain with radiculopathy affecting left lower extremity 02/08/2016  . Carcinoma of breast metastatic to bone ([place]) 02/08/2016  . Bone metastases ([place]) 11/13/2015  . Right thyroid nodule 06/30/2014  . Allergic rhinitis due to pollen 06/30/2014  . Essential hypertension 12/24/2013  . Left ventricular diastolic dysfunction with preserved systolic function 70/62/3762  . Lump in neck 08/11/2013  . Abdominal pain, chronic, bilateral lower quadrant 08/05/2013  . Abdominal pain, epigastric 08/05/2013  . Morbid (severe) obesity due to excess calories ([place]) 06/23/2013  . Allergic rhinitis 06/23/2013  . GERD (gastroesophageal reflux disease) 03/25/2013  . Constipation 03/25/2013  . Other and unspecified hyperlipidemia 03/25/2013  . Dysuria 03/25/2013  . Routine general medical examination at a health care facility 03/25/2013  . Malignant neoplasm of overlapping sites of left breast in female, estrogen receptor positive ([place]) 03/05/2013  . Cough 10/28/2012  . Dyspnea on exertion 10/28/2012  . Chronic abdominal pain 09/02/2012  . Pelvic pain in female 02/06/2012  . Atypical chest pain 11/13/2011  . Hyperlipidemia 11/13/2011  . Dysplasia of cervix, low grade (CIN 1) 08/22/2011  . Diastolic dysfunction 83/15/1761  . Influenza 03/04/2011  .  Hypertension 03/04/2011  . Obesity 03/04/2011  . OSA (obstructive sleep apnea) 02/27/2011  . Asthma 02/08/2011    Past Surgical History:  Procedure Laterality Date  . APPENDECTOMY  y  . CARDIOVASCULAR STRESS TEST  10-29-2012   low risk perfusion study/  no significant reversibity/ ef 66%/  normal wall motion  . CERVICAL CONIZATION W/BX  2012   in  [place] N/A 05/14/2012   Procedure: LAPAROSCOPIC CHOLECYSTECTOMY;  Surgeon: Ralene Ok, MD;   Location: [facility];  Service: General;  Laterality: N/A;  . COLONOSCOPY  2011   normal per patient - [place]  . DILATION AND CURETTAGE OF UTERUS  10/15/2011   Procedure: DILATATION AND CURETTAGE;  Surgeon: Melina Schools, MD;  Location: [facility];  Service: Gynecology;  Laterality: N/A;  Conization &  endocervical curettings  . EXAMINATION UNDER ANESTHESIA N/A 08/20/2013   Procedure: EXAM UNDER ANESTHESIA;  Surgeon: Margarette Asal, MD;  Location: [facility];  Service: Gynecology;  Laterality: N/A;  . [place]   right  . LAPAROSCOPIC APPENDECTOMY N/A 08/22/2017   Procedure: [place];  Surgeon: Coralie Keens, MD;  Location: [facility];  Service: General;  Laterality: N/A;  . LAPAROSCOPIC ASSISTED VAGINAL HYSTERECTOMY N/A 10/26/2014   Procedure: HYSTERECTOMY ABDOMINAL ;  Surgeon: Molli Posey, MD;  Location: [facility];  Service: Gynecology;  Laterality: N/A;  . MASTECTOMY Left 11/2008  in [place]   . SALPINGOOPHORECTOMY Bilateral 10/26/2014   Procedure: BILATERAL SALPINGO OOPHORECTOMY;  Surgeon: Molli Posey, MD;  Location: [facility];  Service: Gynecology;  Laterality: Bilateral;  . TISSUE EXPANDER PLACEMENT Left 08/22/2017   Procedure: REMOVAL OF LEFT TISSUE EXPANDER;  Surgeon: Crissie Reese, MD;  Location: [facility];  Service: Plastics;  Laterality: Left;  . TISSUE EXPANDER REMOVAL Left 08/22/2017  . TRANSTHORACIC ECHOCARDIOGRAM  10-29-2012   mild lvh/  ef 60-65%/  grade II diastolic dysfunction/  trivial mr  &  tr     OB History    Gravida  10   Para  5   Term  5   Preterm      AB  5   Living  5     SAB  3   TAB  2   Ectopic      Multiple      Live Births               Home Medications    Prior to Admission medications   Medication Sig Start Date End Date Taking? Authorizing Provider  amLODipine (NORVASC) 10 MG tablet Take 1 tablet (10 mg total) by mouth daily. 09/24/17  Yes Clent Demark, PA-C    carvedilol (COREG) 6.25 MG tablet Take 1 tablet (6.25 mg total) by mouth 2 (two) times daily with a meal. 12/19/17  Yes Clent Demark, PA-C  cetirizine (ZYRTEC) 10 MG tablet Take 10 mg by mouth daily. 12/19/17  Yes [provider]  exemestane (AROMASIN) 25 MG tablet Take 1 tablet (25 mg total) by mouth daily after breakfast. 11/18/17  Yes [name], MD  fluticasone furoate-vilanterol (BREO ELLIPTA) 100-25 MCG/INH AEPB Inhale 1 puff into the lungs daily. 10/18/17  Yes Martyn Ehrich, NP  furosemide (LASIX) 20 MG tablet Take 1 tablet (20 mg total) by mouth daily as needed (for fluid retention.). 09/25/17  Yes Clent Demark, PA-C  ipratropium-albuterol (DUONEB) 0.5-2.5 (3) MG/3ML SOLN Take 3 mLs by nebulization every 6 (six) hours  as needed. 09/25/17  Yes Clent Demark, PA-C  loratadine (CLARITIN) 10 MG tablet Take 20 mg by mouth daily.    Yes [provider]  montelukast (SINGULAIR) 10 MG tablet Take 1 tablet (10 mg total) by mouth at bedtime. 10/10/17  Yes Clent Demark, PA-C  naproxen (NAPROSYN) 500 MG tablet Take 1 tablet (500 mg total) by mouth 2 (two) times daily with a meal. 04/12/17  Yes Clent Demark, PA-C  promethazine (PHENERGAN) 25 MG tablet Take 1 tablet (25 mg total) by mouth every 6 (six) hours as needed for nausea or vomiting. 12/19/17  Yes Alla Feeling, NP  ranitidine (ZANTAC) 300 MG tablet Take 1 tablet (300 mg total) by mouth at bedtime. Patient taking differently: Take 300 mg by mouth at bedtime as needed (for indigestion.).  12/05/16  Yes Kozlow, Donnamarie Poag, MD  traMADol (ULTRAM) 50 MG tablet Take 1-2 tablets (50-100 mg total) by mouth every 6 (six) hours as needed for moderate pain or severe pain. 09/05/17  Yes [name], MD  benzonatate (TESSALON) 100 MG capsule Take 1 capsule (100 mg total) by mouth 2 (two) times daily as needed for cough. Patient not taking: Reported on 12/23/2017 10/18/17   Martyn Ehrich, NP  IBRANCE 75 MG capsule TAKE ONE  CAPSULE BY MOUTH DAILY WITH FOOD FOR 21 DAYS OF A 28 DAY CYCLE. SWALLOW WHOLE. DO NOT TAKE IF CAPSULES ARE BROKEN OR CRACKED. AVOID Patient not taking: Take with food for 21 days of a 28 day cycle 12/04/17   [name], MD  pantoprazole (PROTONIX) 40 MG tablet Take 1 tablet (40 mg total) by mouth daily. Take 30-60 min before first meal of the day Patient not taking: Reported on 12/23/2017 01/01/17   [name], MD  predniSONE (DELTASONE) 10 MG tablet 2 tabs daily x 5 days Patient not taking: Reported on 12/23/2017 10/18/17   Martyn Ehrich, NP    Family History Family History  Problem Relation Age of Onset  . Breast cancer Mother   . Colon cancer Mother   . Hypotension Mother   . Asthma Mother   . Diabetes type II Mother   . Arthritis Mother   . Clotting disorder Mother   . Cancer Mother        breast/colon  . Mental illness Brother   . Heart disease Brother   . Cerebral palsy Daughter   . Emphysema Brother        never smoker  . Colon cancer Maternal Aunt   . Cancer Maternal Aunt        colon  . Colon cancer Maternal Uncle   . Cancer Maternal Uncle        colon  . Esophageal cancer Neg Hx   . Rectal cancer Neg Hx   . Stomach cancer Neg Hx   . Thyroid disease Neg Hx     Social History Social History   Tobacco Use  . Smoking status: Former Smoker    Packs/day: 0.30    Years: 10.00    Pack years: 3.00    Types: Cigarettes    Last attempt to quit: 03/26/1988    Years since quitting: 29.7  . Smokeless tobacco: Never Used  Substance Use Topics  . Alcohol use: No  . Drug use: No     Allergies   Lisinopril-hydrochlorothiazide; Adhesive [tape]; Fentanyl; Gabapentin; Hctz [hydrochlorothiazide]; Latex; Lisinopril; Losartan potassium; Other; and Oxycodone   Review of Systems Review of Systems  Constitutional:  Per HPI, otherwise negative  HENT:       Per HPI, otherwise negative  Respiratory:       Per HPI, otherwise negative  Cardiovascular:       Per  HPI, otherwise negative  Gastrointestinal: Negative for vomiting.  Endocrine:       Negative aside from HPI  Genitourinary:       Neg aside from HPI   Musculoskeletal:       Per HPI, otherwise negative  Skin: Negative.   Allergic/Immunologic: Positive for immunocompromised state.  Neurological: Positive for weakness. Negative for syncope.     Physical Exam Updated Vital Signs BP (!) 163/62   Pulse 83   Resp 18   Ht 5\' 7"  (7.619 m)   Wt 117.9 kg   LMP 02/25/2009   SpO2 98%   BMI 40.72 kg/m   Physical Exam  Constitutional: She is oriented to person, place, and time. No distress.  Obese female awake and alert  HENT:  Head: Normocephalic and atraumatic.  Eyes: Conjunctivae and EOM are normal.  Cardiovascular: Normal rate and regular rhythm.  Pulmonary/Chest: Effort normal and breath sounds normal. No respiratory distress.  Cough, no gross rales or rhonchi  Abdominal: She exhibits no distension.  Genitourinary: Rectal exam shows external hemorrhoid. Rectal exam shows guaiac negative stool.  Musculoskeletal: She exhibits no edema.  Neurological: She is alert and oriented to person, place, and time. No cranial nerve deficit.  Skin: Skin is warm and dry.  Psychiatric: She has a normal mood and affect.  Nursing note and vitals reviewed.    ED Treatments / Results  Labs (all labs ordered are listed, but only abnormal results are displayed) Labs Reviewed  I-STAT TROPONIN, ED  I-STAT BETA HCG BLOOD, ED ([facility], [facility], [facility] ONLY)  POCT I-STAT TROPONIN I  I-STAT BETA HCG BLOOD, ED (NOT ORDERABLE)  POC OCCULT BLOOD, ED  OCCULT BLOOD, POC DEVICE    Radiology Dg Chest 2 View  Result Date: 12/23/2017 CLINICAL DATA:  Anemia.  Tachycardia.  Cough. EXAM: CHEST - 2 VIEW COMPARISON:  October 18, 2017 FINDINGS: There is no edema or consolidation. Heart size and pulmonary vascularity are normal. No adenopathy. Patient is status post left mastectomy with surgical clips in left axillary region.  No blastic or lytic bone lesions evident. IMPRESSION: Status post left mastectomy. No edema or consolidation. No evident adenopathy. Electronically Signed   By: Lowella Grip III M.D.   On: 12/23/2017 11:50    Procedures Procedures (including critical care time)  Medications Ordered in ED Medications  albuterol (PROVENTIL HFA;VENTOLIN HFA) 108 (90 Base) MCG/ACT inhaler 2 puff (has no administration in time range)     Initial Impression / Assessment and Plan / ED Course  I have reviewed the triage vital signs and the nursing notes.  Pertinent labs & imaging results that were available during my care of the patient were reviewed by me and considered in my medical decision making (see chart for details).     3:07 PM Patient is now accompanied by 2 elders from her church. We discussed options for therapy for symptomatic anemia, and after discussion on risks and benefits of erythropoietin analog, darbepoetin, patient is now amenable to this medication. Medicine is available in our cancer center, and the adjacent facility, and patient will require discharge with transfer there to receive it. Patient remains hemodynamically unremarkable, awake, alert, afebrile X-ray discussed again, no evidence for pneumonia. Patient does have some cough, will receive albuterol, inhaler on discharge  to use for relief.  This patient with ongoing breast cancer therapy, abnormal hemoglobin value presents due to weakness. Patient is not amenable to blood transfusion, but after several conversations with her and church elders, is interested in receiving Aranesp. Patient likely having symptomatic anemia, possibly secondary to chemotherapy, this is a reasonable course. Patient does have mild cough, but no evidence for pneumonia, and is afebrile.   Final Clinical Impressions(s) / ED Diagnoses   Final diagnoses:  Symptomatic anemia  Cough    ED Discharge Orders    None       Carmin Muskrat,  MD 12/23/17 1511

## 2017-12-23 NOTE — ED Notes (Signed)
Discharge instructions reviewed with patient- patient to proceed to cancer center. Patient verbalizes understanding. VSS.

## 2017-12-24 ENCOUNTER — Other Ambulatory Visit (INDEPENDENT_AMBULATORY_CARE_PROVIDER_SITE_OTHER): Payer: Self-pay | Admitting: Physician Assistant

## 2017-12-24 ENCOUNTER — Telehealth (INDEPENDENT_AMBULATORY_CARE_PROVIDER_SITE_OTHER): Payer: Self-pay | Admitting: Physician Assistant

## 2017-12-24 ENCOUNTER — Encounter: Payer: Self-pay | Admitting: Hematology

## 2017-12-24 DIAGNOSIS — D61818 Other pancytopenia: Secondary | ICD-10-CM | POA: Insufficient documentation

## 2017-12-24 DIAGNOSIS — D649 Anemia, unspecified: Secondary | ICD-10-CM

## 2017-12-24 LAB — HAPTOGLOBIN: HAPTOGLOBIN: 243 mg/dL — AB (ref 34–200)

## 2017-12-24 LAB — CANCER ANTIGEN 27.29: CAN 27.29: 588.8 U/mL — AB (ref 0.0–38.6)

## 2017-12-24 NOTE — Telephone Encounter (Signed)
Patient appointment is scheduled for 10/17 please advise. Nat Christen, CMA

## 2017-12-24 NOTE — Telephone Encounter (Signed)
Pt may come in for CBC within this week. I have ordered CBC. Keep original appointment and we will call with results.

## 2017-12-24 NOTE — Telephone Encounter (Signed)
Melissa a Nurse Case Freight forwarder from [facility] called to inform that Ms. Yore had an appointment with Oncologist yesterday and was sent to the ED due to her Hemoglobin being 4.6. Melissa states that she has talked to the patient and that Ms. Benjamin cannot walk from her couch to the kitchen due to her being short of breath. Melissa also stated that Ms.Gorka told her that she had to lean over the kitchen sink to wash her self since that is the only support she has. Lenna Sciara states that the patient will need Home Health at least for short term until her hemoglobin are back to normal range and wanted to know PCP advice on seeing her sooner than her appointment on Oct 17 @ 11:10 am.    Please Advice [phone] Ext: [phone] Lenna Sciara)  Thank you Emmit Pomfret

## 2017-12-25 ENCOUNTER — Telehealth: Payer: Self-pay

## 2017-12-25 ENCOUNTER — Other Ambulatory Visit: Payer: Self-pay

## 2017-12-25 ENCOUNTER — Telehealth: Payer: Self-pay | Admitting: Hematology

## 2017-12-25 DIAGNOSIS — D61818 Other pancytopenia: Secondary | ICD-10-CM

## 2017-12-25 NOTE — Progress Notes (Signed)
ul ?

## 2017-12-25 NOTE — Telephone Encounter (Signed)
Patient is aware and will try to come in and CBC collected. Michaela Little, CMA

## 2017-12-25 NOTE — Telephone Encounter (Signed)
Faxed referral to [facility] for SN and labs every 2 weeks.

## 2017-12-25 NOTE — Telephone Encounter (Signed)
R/s appt per 10/1 sch message - pt is aware of appt date and time.

## 2017-12-30 ENCOUNTER — Telehealth: Payer: Self-pay

## 2017-12-30 NOTE — Telephone Encounter (Signed)
Faxed back signed order for [facility].

## 2017-12-31 ENCOUNTER — Other Ambulatory Visit (INDEPENDENT_AMBULATORY_CARE_PROVIDER_SITE_OTHER): Payer: Medicare Other

## 2017-12-31 DIAGNOSIS — D649 Anemia, unspecified: Secondary | ICD-10-CM

## 2017-12-31 NOTE — Progress Notes (Signed)
Lab visit

## 2018-01-01 ENCOUNTER — Telehealth (INDEPENDENT_AMBULATORY_CARE_PROVIDER_SITE_OTHER): Payer: Self-pay | Admitting: Physician Assistant

## 2018-01-01 ENCOUNTER — Observation Stay (HOSPITAL_COMMUNITY)
Admission: EM | Admit: 2018-01-01 | Discharge: 2018-01-04 | Disposition: A | Discharge status: Home Health | Payer: Medicare Other | Attending: Internal Medicine | Admitting: Internal Medicine

## 2018-01-01 ENCOUNTER — Other Ambulatory Visit: Payer: Self-pay

## 2018-01-01 ENCOUNTER — Encounter (HOSPITAL_COMMUNITY): Payer: Self-pay | Admitting: Emergency Medicine

## 2018-01-01 ENCOUNTER — Emergency Department (HOSPITAL_COMMUNITY): Payer: Medicare Other

## 2018-01-01 ENCOUNTER — Other Ambulatory Visit: Payer: Self-pay | Admitting: Hematology

## 2018-01-01 DIAGNOSIS — Z79899 Other long term (current) drug therapy: Secondary | ICD-10-CM | POA: Diagnosis not present

## 2018-01-01 DIAGNOSIS — Z888 Allergy status to other drugs, medicaments and biological substances status: Secondary | ICD-10-CM | POA: Diagnosis not present

## 2018-01-01 DIAGNOSIS — Z885 Allergy status to narcotic agent status: Secondary | ICD-10-CM | POA: Diagnosis not present

## 2018-01-01 DIAGNOSIS — C7951 Secondary malignant neoplasm of bone: Secondary | ICD-10-CM | POA: Diagnosis not present

## 2018-01-01 DIAGNOSIS — J9601 Acute respiratory failure with hypoxia: Secondary | ICD-10-CM | POA: Diagnosis not present

## 2018-01-01 DIAGNOSIS — C50919 Malignant neoplasm of unspecified site of unspecified female breast: Secondary | ICD-10-CM

## 2018-01-01 DIAGNOSIS — Z7951 Long term (current) use of inhaled steroids: Secondary | ICD-10-CM | POA: Diagnosis not present

## 2018-01-01 DIAGNOSIS — Z9221 Personal history of antineoplastic chemotherapy: Secondary | ICD-10-CM | POA: Diagnosis not present

## 2018-01-01 DIAGNOSIS — Z853 Personal history of malignant neoplasm of breast: Secondary | ICD-10-CM | POA: Insufficient documentation

## 2018-01-01 DIAGNOSIS — D61810 Antineoplastic chemotherapy induced pancytopenia: Secondary | ICD-10-CM | POA: Insufficient documentation

## 2018-01-01 DIAGNOSIS — C799 Secondary malignant neoplasm of unspecified site: Secondary | ICD-10-CM | POA: Diagnosis not present

## 2018-01-01 DIAGNOSIS — I5189 Other ill-defined heart diseases: Secondary | ICD-10-CM

## 2018-01-01 DIAGNOSIS — K219 Gastro-esophageal reflux disease without esophagitis: Secondary | ICD-10-CM | POA: Insufficient documentation

## 2018-01-01 DIAGNOSIS — Z79811 Long term (current) use of aromatase inhibitors: Secondary | ICD-10-CM | POA: Insufficient documentation

## 2018-01-01 DIAGNOSIS — Z789 Other specified health status: Secondary | ICD-10-CM | POA: Diagnosis present

## 2018-01-01 DIAGNOSIS — D61818 Other pancytopenia: Secondary | ICD-10-CM | POA: Diagnosis not present

## 2018-01-01 DIAGNOSIS — J40 Bronchitis, not specified as acute or chronic: Secondary | ICD-10-CM | POA: Diagnosis present

## 2018-01-01 DIAGNOSIS — D649 Anemia, unspecified: Secondary | ICD-10-CM | POA: Diagnosis present

## 2018-01-01 DIAGNOSIS — E538 Deficiency of other specified B group vitamins: Secondary | ICD-10-CM

## 2018-01-01 DIAGNOSIS — G4733 Obstructive sleep apnea (adult) (pediatric): Secondary | ICD-10-CM | POA: Diagnosis not present

## 2018-01-01 DIAGNOSIS — R0609 Other forms of dyspnea: Secondary | ICD-10-CM

## 2018-01-01 DIAGNOSIS — Z6839 Body mass index (BMI) 39.0-39.9, adult: Secondary | ICD-10-CM | POA: Insufficient documentation

## 2018-01-01 DIAGNOSIS — I110 Hypertensive heart disease with heart failure: Secondary | ICD-10-CM | POA: Diagnosis not present

## 2018-01-01 DIAGNOSIS — I5030 Unspecified diastolic (congestive) heart failure: Secondary | ICD-10-CM | POA: Diagnosis not present

## 2018-01-01 DIAGNOSIS — Z531 Procedure and treatment not carried out because of patient's decision for reasons of belief and group pressure: Secondary | ICD-10-CM

## 2018-01-01 DIAGNOSIS — Z8249 Family history of ischemic heart disease and other diseases of the circulatory system: Secondary | ICD-10-CM | POA: Insufficient documentation

## 2018-01-01 DIAGNOSIS — D630 Anemia in neoplastic disease: Secondary | ICD-10-CM | POA: Diagnosis present

## 2018-01-01 DIAGNOSIS — Z9012 Acquired absence of left breast and nipple: Secondary | ICD-10-CM | POA: Diagnosis not present

## 2018-01-01 DIAGNOSIS — Z87891 Personal history of nicotine dependence: Secondary | ICD-10-CM | POA: Insufficient documentation

## 2018-01-01 DIAGNOSIS — IMO0001 Reserved for inherently not codable concepts without codable children: Secondary | ICD-10-CM | POA: Diagnosis present

## 2018-01-01 DIAGNOSIS — J45909 Unspecified asthma, uncomplicated: Secondary | ICD-10-CM | POA: Diagnosis not present

## 2018-01-01 DIAGNOSIS — I10 Essential (primary) hypertension: Secondary | ICD-10-CM | POA: Diagnosis present

## 2018-01-01 LAB — CBC WITH DIFFERENTIAL/PLATELET
BASOS ABS: 0 10*3/uL (ref 0.0–0.2)
BASOS ABS: 0.1 10*3/uL (ref 0.0–0.1)
Basophils Relative: 3 %
Basos: 0 %
EOS (ABSOLUTE): 0 10*3/uL (ref 0.0–0.4)
Eos: 1 %
Eosinophils Absolute: 0.1 10*3/uL (ref 0.0–0.5)
Eosinophils Relative: 3 %
HCT: 13.5 % — ABNORMAL LOW (ref 36.0–46.0)
HEMATOCRIT: 13.1 % — AB (ref 34.0–46.6)
HEMOGLOBIN: 4.1 g/dL — AB (ref 12.0–15.0)
Hemoglobin: 4.2 g/dL — CL (ref 11.1–15.9)
IMMATURE GRANS (ABS): 0.1 10*3/uL (ref 0.0–0.1)
Immature Granulocytes: 2 %
LYMPHS: 44 %
Lymphocytes Absolute: 1.1 10*3/uL (ref 0.7–3.1)
Lymphocytes Relative: 40 %
Lymphs Abs: 0.9 10*3/uL (ref 0.7–4.0)
MCH: 28 pg (ref 26.6–33.0)
MCH: 27.7 pg (ref 26.0–34.0)
MCHC: 32.1 g/dL (ref 31.5–35.7)
MCHC: 30.4 g/dL (ref 30.0–36.0)
MCV: 87 fL (ref 79–97)
MCV: 91.2 fL (ref 80.0–100.0)
MONO ABS: 0.3 10*3/uL (ref 0.1–1.0)
MONOCYTES: 12 %
Monocytes Absolute: 0.3 10*3/uL (ref 0.1–0.9)
Monocytes Relative: 11 %
NEUTROS ABS: 1 10*3/uL — AB (ref 1.7–7.7)
NEUTROS PCT: 41 %
NRBC: 23 % — AB (ref 0–0)
Neutrophils Absolute: 1 10*3/uL — ABNORMAL LOW (ref 1.4–7.0)
Neutrophils Relative %: 43 %
PLATELETS: 108 10*3/uL — AB (ref 150–450)
PLATELETS: 84 10*3/uL — AB (ref 150–400)
RBC: 1.5 x10E6/uL — AB (ref 3.77–5.28)
RBC: 1.48 MIL/uL — ABNORMAL LOW (ref 3.87–5.11)
RDW: 22.3 % — ABNORMAL HIGH (ref 12.3–15.4)
RDW: 22.4 % — AB (ref 11.5–15.5)
WBC: 2.5 10*3/uL — AB (ref 3.4–10.8)
WBC: 2.4 10*3/uL — ABNORMAL LOW (ref 4.0–10.5)

## 2018-01-01 LAB — BASIC METABOLIC PANEL
ANION GAP: 7 (ref 5–15)
BUN: 6 mg/dL (ref 6–20)
CALCIUM: 8.9 mg/dL (ref 8.9–10.3)
CO2: 21 mmol/L — ABNORMAL LOW (ref 22–32)
Chloride: 109 mmol/L (ref 98–111)
Creatinine, Ser: 0.68 mg/dL (ref 0.44–1.00)
GLUCOSE: 133 mg/dL — AB (ref 70–99)
Potassium: 3.5 mmol/L (ref 3.5–5.1)
Sodium: 137 mmol/L (ref 135–145)

## 2018-01-01 LAB — BRAIN NATRIURETIC PEPTIDE: B NATRIURETIC PEPTIDE 5: 25.8 pg/mL (ref 0.0–100.0)

## 2018-01-01 LAB — RETICULOCYTES
Immature Retic Fract: 36.8 % — ABNORMAL HIGH (ref 2.3–15.9)
RBC.: 1.49 MIL/uL — AB (ref 3.87–5.11)
RETIC COUNT ABSOLUTE: 62.7 10*3/uL (ref 19.0–186.0)
RETIC CT PCT: 4.2 % — AB (ref 0.4–3.1)

## 2018-01-01 LAB — PROTIME-INR
INR: 1.25
Prothrombin Time: 15.6 seconds — ABNORMAL HIGH (ref 11.4–15.2)

## 2018-01-01 LAB — IRON AND TIBC
Iron: 182 ug/dL — ABNORMAL HIGH (ref 28–170)
Saturation Ratios: 57 % — ABNORMAL HIGH (ref 10.4–31.8)
TIBC: 318 ug/dL (ref 250–450)
UIBC: 136 ug/dL

## 2018-01-01 LAB — APTT: aPTT: 27 seconds (ref 24–36)

## 2018-01-01 LAB — TROPONIN I: Troponin I: <0.03 ng/mL (ref <0.03)

## 2018-01-01 LAB — FERRITIN: FERRITIN: 935 ng/mL — AB (ref 11–307)

## 2018-01-01 LAB — FOLATE: FOLATE: 7.9 ng/mL (ref >5.9)

## 2018-01-01 LAB — VITAMIN B12: Vitamin B-12: 254 pg/mL (ref 180–914)

## 2018-01-01 LAB — NO BLOOD PRODUCTS

## 2018-01-01 MED ORDER — DARBEPOETIN ALFA 300 MCG/0.6ML IJ SOSY
300.0000 ug | PREFILLED_SYRINGE | Freq: Once | INTRAMUSCULAR | Status: AC
  Administered 2018-01-01: 300 ug via SUBCUTANEOUS
  Filled 2018-01-01: qty 0.6

## 2018-01-01 NOTE — ED Notes (Signed)
Critical hgb 4.1

## 2018-01-01 NOTE — ED Notes (Signed)
POC OCCULT NEG-RAN BEFORE 7PM

## 2018-01-01 NOTE — Telephone Encounter (Signed)
Noted  

## 2018-01-01 NOTE — Telephone Encounter (Signed)
[facility] called to inform that Michaela Little Hemoglobin is 4.2 and her Hematocrit is 13.1   [employer] [place]

## 2018-01-01 NOTE — Telephone Encounter (Signed)
PCP notified verbally and via fax. Patient aware that hemoglobin is very low. She was waiting for daughter to come pick her up to take to ED. Nat Christen, CMA

## 2018-01-01 NOTE — ED Provider Notes (Signed)
MSE was initiated and I personally evaluated the patient and placed orders (if any) at  4:27 PM on January 01, 2018.  The patient appears stable so that the remainder of the MSE may be completed by another provider.  Patient placed in Quick Look pathway, seen and evaluated   Chief Complaint: Abnormal lab work  HPI:   57 year old female with a past medical history of breast cancer, discontinued chemotherapy 1 week ago, presents to ED for low hemoglobin.  She was seen at the cancer center about 5 days ago and was given "a shot to boost my blood cell count up."  Patient refuses blood transfusions due to her religion.  She does report several episodes of hematochezia over the weekend.  She was seen by her PCPs office yesterday and was told today that she had a hemoglobin of 4.2.  She denies any blood thinner use reports ongoing fatigue, shortness of breath and generalized abdominal discomfort for the past 3 weeks.  States that she takes several steps and then has to "catch my breath."  Denies any fever, urinary symptoms.  ROS: fatigue (one)  Physical Exam:   Gen: No distress  Neuro: Awake and Alert  Skin: Warm    Focused Exam: Lungs CTAB. RRR. Appears fatigued.   Initiation of care has begun. The patient has been counseled on the process, plan, and necessity for staying for the completion/evaluation, and the remainder of the medical screening examination    Delia Heady, PA-C 01/01/18 [place], [place], DO 01/02/18 0000

## 2018-01-01 NOTE — ED Provider Notes (Addendum)
[facility] EMERGENCY DEPARTMENT Provider Note   CSN: [id number] Arrival date & time: 01/01/18  1620     History   Chief Complaint Chief Complaint  Patient presents with  . Abnormal Lab  . GI Bleeding    HPI Michaela Little is a 57 y.o. female.  HPI  57 year old female with history of breast cancer with metastases, receiving oral chemotherapy last dose towards the end of September comes in with chief complaint of significant low hemoglobin.  Patient follows Jehovah's Witness faith.  She reports that about 2 or 3 weeks ago she started noticing stools with red streaks in it.  Patient initially thought that the streaks were because of her eating beets, however her symptoms had continued therefore she had come for further evaluation to the ED.  Since then, she continues to have some red streaks in her stools and commode.  Patient over the past few days has gotten increasingly short of breath with exertion and feeling fatigued.  She has also had near syncope and chest discomfort.  Today patient had another blood draw done by the PCP and her hemoglobin was low therefore she was advised to come to the ER for further evaluation.  Past Medical History:  Diagnosis Date  . Anxiety   . Arthritis    hands, knees, hips  . Asthma   . Bronchitis   . Cancer ([place])   . Cervical stenosis (uterine cervix)   . Disorder of appendix    Enlarged  . Dyspnea on exertion   . GERD (gastroesophageal reflux disease)   . Headache(784.0)   . History of breast cancer    2010--  LEFT  s/p  mastectomy (in [place]) AND CHEMORADIATION--  NO RECURRENCE  . History of cervical dysplasia   . Hyperlipidemia   . Hypertension   . Malignant neoplasm of overlapping sites of left breast in female, estrogen receptor positive ([place]) 03/05/2013  . OSA (obstructive sleep apnea) moderate osa per study  09/2010   CPAP  , NOT USING ON REGULAR BASIS  . Pelvic pain in female   . Positive H. pylori test    08-05-2013  . Positive TB test    AS TEEN--  [place] W/ MEDS  . Refusal of blood transfusions as patient is Jehovah's Witness   . Seasonal allergies   . Uterine fibroid   . Wears glasses     Patient Active Problem List   Diagnosis Date Noted  . Other pancytopenia ([place]) 12/24/2017  . Asthma exacerbation 10/18/2017  . High risk medication use 09/25/2017  . Lower extremity edema 09/25/2017  . S/P laparoscopic appendectomy 08/22/2017  . Goals of care, counseling/discussion 01/20/2017  . Multinodular thyroid 02/08/2016  . Lumbar back pain with radiculopathy affecting left lower extremity 02/08/2016  . Carcinoma of breast metastatic to bone ([place]) 02/08/2016  . Bone metastases ([place]) 11/13/2015  . Right thyroid nodule 06/30/2014  . Allergic rhinitis due to pollen 06/30/2014  . Essential hypertension 12/24/2013  . Left ventricular diastolic dysfunction with preserved systolic function 41/96/2229  . Lump in neck 08/11/2013  . Abdominal pain, chronic, bilateral lower quadrant 08/05/2013  . Abdominal pain, epigastric 08/05/2013  . Morbid (severe) obesity due to excess calories ([place]) 06/23/2013  . Allergic rhinitis 06/23/2013  . GERD (gastroesophageal reflux disease) 03/25/2013  . Constipation 03/25/2013  . Other and unspecified hyperlipidemia 03/25/2013  . Dysuria 03/25/2013  . Routine general medical examination at a health care facility 03/25/2013  . Malignant neoplasm of overlapping sites  of left breast in female, estrogen receptor positive ([place]) 03/05/2013  . Cough 10/28/2012  . Dyspnea on exertion 10/28/2012  . Chronic abdominal pain 09/02/2012  . Pelvic pain in female 02/06/2012  . Atypical chest pain 11/13/2011  . Hyperlipidemia 11/13/2011  . Dysplasia of cervix, low grade (CIN 1) 08/22/2011  . Diastolic dysfunction 28/31/5176  . Influenza 03/04/2011  . Hypertension 03/04/2011  . Obesity 03/04/2011  . OSA (obstructive sleep apnea) 02/27/2011  . Asthma 02/08/2011    Past  Surgical History:  Procedure Laterality Date  . APPENDECTOMY  y  . CARDIOVASCULAR STRESS TEST  10-29-2012   low risk perfusion study/  no significant reversibity/ ef 66%/  normal wall motion  . CERVICAL CONIZATION W/BX  2012   in  [place] N/A 05/14/2012   Procedure: LAPAROSCOPIC CHOLECYSTECTOMY;  Surgeon: Ralene Ok, MD;  Location: [facility];  Service: General;  Laterality: N/A;  . COLONOSCOPY  2011   normal per patient - [place]  . DILATION AND CURETTAGE OF UTERUS  10/15/2011   Procedure: DILATATION AND CURETTAGE;  Surgeon: Melina Schools, MD;  Location: [facility];  Service: Gynecology;  Laterality: N/A;  Conization &  endocervical curettings  . EXAMINATION UNDER ANESTHESIA N/A 08/20/2013   Procedure: EXAM UNDER ANESTHESIA;  Surgeon: Margarette Asal, MD;  Location[facility];  Service: Gynecology;  Laterality: N/A;  . [place]   right  . LAPAROSCOPIC APPENDECTOMY N/A 08/22/2017   Procedure: [place];  Surgeon: Coralie Keens, MD;  Location: [facility];  Service: General;  Laterality: N/A;  . LAPAROSCOPIC ASSISTED VAGINAL HYSTERECTOMY N/A 10/26/2014   Procedure: HYSTERECTOMY ABDOMINAL ;  Surgeon: Molli Posey, MD;  Location: [facility];  Service: Gynecology;  Laterality: N/A;  . MASTECTOMY Left 11/2008  in [place]   . SALPINGOOPHORECTOMY Bilateral 10/26/2014   Procedure: BILATERAL SALPINGO OOPHORECTOMY;  Surgeon: Molli Posey, MD;  Location: [facility];  Service: Gynecology;  Laterality: Bilateral;  . TISSUE EXPANDER PLACEMENT Left 08/22/2017   Procedure: REMOVAL OF LEFT TISSUE EXPANDER;  Surgeon: Crissie Reese, MD;  Location: [facility];  Service: Plastics;  Laterality: Left;  . TISSUE EXPANDER REMOVAL Left 08/22/2017  . TRANSTHORACIC ECHOCARDIOGRAM  10-29-2012   mild lvh/  ef 60-65%/  grade II diastolic dysfunction/  trivial mr  &  tr     OB History    Gravida  10   Para  5   Term  5   Preterm      AB  5    Living  5     SAB  3   TAB  2   Ectopic      Multiple      Live Births               Home Medications    Prior to Admission medications   Medication Sig Start Date End Date Taking? Authorizing Provider  amLODipine (NORVASC) 10 MG tablet Take 1 tablet (10 mg total) by mouth daily. 09/24/17   Clent Demark, PA-C  benzonatate (TESSALON) 100 MG capsule Take 1 capsule (100 mg total) by mouth 2 (two) times daily as needed for cough. Patient not taking: Reported on 12/23/2017 10/18/17   Martyn Ehrich, NP  carvedilol (COREG) 6.25 MG tablet Take 1 tablet (6.25 mg total) by mouth 2 (two) times daily with a meal. 12/19/17   Clent Demark, PA-C  cetirizine (ZYRTEC) 10 MG tablet Take 10  mg by mouth daily. 12/19/17   [provider]  exemestane (AROMASIN) 25 MG tablet Take 1 tablet (25 mg total) by mouth daily after breakfast. 11/18/17   [name], MD  fluticasone furoate-vilanterol (BREO ELLIPTA) 100-25 MCG/INH AEPB Inhale 1 puff into the lungs daily. 10/18/17   Martyn Ehrich, NP  furosemide (LASIX) 20 MG tablet Take 1 tablet (20 mg total) by mouth daily as needed (for fluid retention.). 09/25/17   Clent Demark, PA-C  IBRANCE 75 MG capsule TAKE ONE CAPSULE BY MOUTH DAILY WITH FOOD FOR 21 DAYS OF A 28 DAY CYCLE. SWALLOW WHOLE. DO NOT TAKE IF CAPSULES ARE BROKEN OR CRACKED. AVOID Patient taking differently: Take 75 mg by mouth See admin instructions. ta 12/04/17   [name], MD  ipratropium-albuterol (DUONEB) 0.5-2.5 (3) MG/3ML SOLN Take 3 mLs by nebulization every 6 (six) hours as needed. 09/25/17   Clent Demark, PA-C  loratadine (CLARITIN) 10 MG tablet Take 20 mg by mouth daily.     [provider]  montelukast (SINGULAIR) 10 MG tablet Take 1 tablet (10 mg total) by mouth at bedtime. 10/10/17   Clent Demark, PA-C  naproxen (NAPROSYN) 500 MG tablet Take 1 tablet (500 mg total) by mouth 2 (two) times daily with a meal. 04/12/17   Clent Demark, PA-C    pantoprazole (PROTONIX) 40 MG tablet Take 1 tablet (40 mg total) by mouth daily. Take 30-60 min before first meal of the day 01/01/17   [name], MD  predniSONE (DELTASONE) 10 MG tablet 2 tabs daily x 5 days 10/18/17   Martyn Ehrich, NP  promethazine (PHENERGAN) 25 MG tablet Take 1 tablet (25 mg total) by mouth every 6 (six) hours as needed for nausea or vomiting. 12/19/17   Alla Feeling, NP  ranitidine (ZANTAC) 300 MG tablet Take 1 tablet (300 mg total) by mouth at bedtime. Patient taking differently: Take 300 mg by mouth at bedtime as needed (for indigestion.).  12/05/16   Kozlow, Donnamarie Poag, MD  traMADol (ULTRAM) 50 MG tablet Take 1-2 tablets (50-100 mg total) by mouth every 6 (six) hours as needed for moderate pain or severe pain. 09/05/17   [name], MD    Family History Family History  Problem Relation Age of Onset  . Breast cancer Mother   . Colon cancer Mother   . Hypotension Mother   . Asthma Mother   . Diabetes type II Mother   . Arthritis Mother   . Clotting disorder Mother   . Cancer Mother        breast/colon  . Mental illness Brother   . Heart disease Brother   . Cerebral palsy Daughter   . Emphysema Brother        never smoker  . Colon cancer Maternal Aunt   . Cancer Maternal Aunt        colon  . Colon cancer Maternal Uncle   . Cancer Maternal Uncle        colon  . Esophageal cancer Neg Hx   . Rectal cancer Neg Hx   . Stomach cancer Neg Hx   . Thyroid disease Neg Hx     Social History Social History   Tobacco Use  . Smoking status: Former Smoker    Packs/day: 0.30    Years: 10.00    Pack years: 3.00    Types: Cigarettes    Last attempt to quit: 03/26/1988    Years since quitting: 29.7  .  Smokeless tobacco: Never Used  Substance Use Topics  . Alcohol use: No  . Drug use: No     Allergies   Lisinopril-hydrochlorothiazide; Adhesive [tape]; Fentanyl; Gabapentin; Hctz [hydrochlorothiazide]; Latex; Lisinopril; Losartan potassium; Other;  Oxycodone; and Tums [calcium carbonate antacid]   Review of Systems Review of Systems  Constitutional: Positive for activity change and fatigue.  Respiratory: Positive for shortness of breath.   Cardiovascular: Negative for palpitations.  Gastrointestinal: Negative for abdominal pain.  Allergic/Immunologic: Negative for immunocompromised state.  Neurological: Positive for dizziness.  Hematological: Does not bruise/bleed easily.  All other systems reviewed and are negative.    Physical Exam Updated Vital Signs BP (!) 161/60   Pulse 97   Temp 98.6 F (37 C) (Oral)   Resp 10   Ht 5\' 7"  (1.702 m)   Wt 117.9 kg   LMP 02/25/2009   SpO2 100%   BMI 40.72 kg/m   Physical Exam  Constitutional: She is oriented to person, place, and time. She appears well-developed.  HENT:  Head: Normocephalic and atraumatic.  Eyes: EOM are normal.  Neck: Normal range of motion. Neck supple.  Cardiovascular: Normal rate.  Pulmonary/Chest: Effort normal.  Abdominal: Soft. Bowel sounds are normal. There is no tenderness.  No grossly bloody stools  Neurological: She is alert and oriented to person, place, and time.  Skin: Skin is warm and dry.  Nursing note and vitals reviewed.    ED Treatments / Results  Labs (all labs ordered are listed, but only abnormal results are displayed) Labs Reviewed  BASIC METABOLIC PANEL - Abnormal; Notable for the following components:      Result Value   CO2 21 (*)    Glucose, Bld 133 (*)    All other components within normal limits  CBC WITH DIFFERENTIAL/PLATELET - Abnormal; Notable for the following components:   WBC 2.4 (*)    RBC 1.48 (*)    Hemoglobin 4.1 (*)    HCT 13.5 (*)    RDW 22.4 (*)    Platelets 84 (*)    Neutro Abs 1.0 (*)    All other components within normal limits  PROTIME-INR - Abnormal; Notable for the following components:   Prothrombin Time 15.6 (*)    All other components within normal limits  IRON AND TIBC - Abnormal; Notable  for the following components:   Iron 182 (*)    Saturation Ratios 57 (*)    All other components within normal limits  FERRITIN - Abnormal; Notable for the following components:   Ferritin 935 (*)    All other components within normal limits  RETICULOCYTES - Abnormal; Notable for the following components:   Retic Ct Pct 4.2 (*)    RBC. 1.49 (*)    Immature Retic Fract 36.8 (*)    All other components within normal limits  APTT  BRAIN NATRIURETIC PEPTIDE  TROPONIN I  VITAMIN B12  FOLATE  URINALYSIS, ROUTINE W REFLEX MICROSCOPIC  POC OCCULT BLOOD, ED  NO BLOOD PRODUCTS    EKG None  Radiology Dg Chest Port 1 View  Result Date: 01/01/2018 CLINICAL DATA:  Low blood count.  History of breast cancer. EXAM: PORTABLE CHEST 1 VIEW COMPARISON:  December 23, 2017 FINDINGS: The heart size and mediastinal contours are stable. The heart size is enlarged. Both lungs are clear. Patient status post prior left mastectomy with left axillary dissection. Degenerative joint changes of the spine are noted. IMPRESSION: No active cardiopulmonary disease. Electronically Signed   By: Mallie Darting.D.  On: 01/01/2018 18:40    Procedures .Critical Care Performed by: Varney Biles, MD Authorized by: Varney Biles, MD   Critical care provider statement:    Critical care time (minutes):  185   Critical care start time:  01/01/2018 4:44 PM   Critical care end time:  01/01/2018 10:44 PM   Critical care was necessary to treat or prevent imminent or life-threatening deterioration of the following conditions:  Circulatory failure   Critical care was time spent personally by me on the following activities:  Discussions with consultants, evaluation of patient's response to treatment, examination of patient, ordering and performing treatments and interventions, ordering and review of laboratory studies, ordering and review of radiographic studies, pulse oximetry, re-evaluation of patient's condition, obtaining  history from patient or surrogate and review of old charts   I assumed direction of critical care for this patient from another provider in my specialty: yes     (including critical care time)    Medications Ordered in ED Medications  Darbepoetin Alfa (ARANESP) injection 300 mcg (has no administration in time range)     Initial Impression / Assessment and Plan / ED Course  I have reviewed the triage vital signs and the nursing notes.  Pertinent labs & imaging results that were available during my care of the patient were reviewed by me and considered in my medical decision making (see chart for details).  Clinical Course as of Jan 02 2243  Wed Jan 01, 2018  1730 Patient's hemoglobin is noted to be 4.1.  She is symptomatic with her anemia.  Patient is Jehovah's Witness and has declined blood transfusion.  I have consulted oncology.  In addition to low hemoglobin, her white cells and her platelets are also low.  Her last oral chemo was 3 weeks ago and there could be bone marrow suppression from it.  The other possibility includes progression of cancer into the bone marrow leading to marrow suppression.  Hemoccult will be completed. Heme-onc has been consulted.  Hemoglobin(!!): 4.1 [AN]  1837 I spoke with Dr. Velvet Bathe, oncology. We discussed the patient's lab results and the oncology history.  He is also aware of her Jehovah's Witness status.  He recommends that from medical perspective patient will need blood transfusion at this time.  Alternatives like Depo or iron will not work immediately and patient is at high risk of decompensation if she does not receive blood.  He will come and see the patient.  I have discussed the recommendations from oncology as well.  Patient continues to not 1 blood at this time, she has requested that her pastor from church and daughter be involved in the decision-making.  At the bedside currently are her 2 support sisters from the church.  Patient's CODE  STATUS is full code. Patient is overwhelmed with all the discussions at this moment.  I have asked her to recruit her church members and family members and carry forth with some of these important decisions.   [AN]  1859 I have provided patient the AMA form for her to sign.  I have discussed the findings with patient's daughter.  Hemoccult exam was completed and there is no gross blood appreciated.   [AN]  2000 Significant discussions made again. Patient informed that her reticulocyte count is only mildly elevated, which tells [place] that her bone marrow is potentially not compensating well enough to keep up with the blood loss   [AN]  2100 Patient is awaiting her daughter to come and help with  the decision making. More questions answered.  Patient is asking if giving her blood transfusion can permanently keep the blood level up.  I told her that RBCs have finite half-life, therefore blood transfusion is not a definitive care option if the bone marrow is not working properly.   [AN]  2241 Patient's daughter just walked in.  At this time they are requesting that we call [facility] to see if they have any bloodless substitutes. Further questions were asked by different members of the church and family and were answered at the best of my capacity.  They want another shot of Aranesp. I spoke with Dr. Irene Limbo, who approved giving Aranesp or if people which ever is on formulary. Will be signing out care to the incoming team.   [AN]    Clinical Course User Index [AN] Varney Biles, MD    57 year old female comes in with chief complaint of weakness, shortness of breath with exertion and dizziness.  She has a hemoglobin less than 5 and is symptomatic with her anemia.  Patient is Jehovah's Witness and has refused blood transfusion.  She is here with 2 support sisters from a church.  Her CODE STATUS is full code at this time, and patient wants to stay full code for now.  Based on pancytopenia that is  appreciated on the lab work-up, I suspect that the result of her anemia is bone marrow suppression, possibly because of the cancer spread or because of the medication side effect.  There could be an added element of GI bleed, however she has grossly normal-appearing stools on digital rectal exam.  Heme-onc will be consulted. Patient is discussing the case further with her family members and church members.  Holding blood because of patient's request.  Final Clinical Impressions(s) / ED Diagnoses   Final diagnoses:  None    ED Discharge Orders    None       Varney Biles, MD 01/01/18 [place], Evetta Renner, MD 01/01/18 2244

## 2018-01-01 NOTE — ED Provider Notes (Signed)
Assumed care from Dr. Kathrynn Humble at shift change.  See prior notes for full H&P.  57 y.o. F with history of breast cancer,  last oral chemo 12/19/17 [name]) but discontinued at that time.  Got EPO on Monday, 12/23/17. 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  Reported black stool but guaic negative.  Refusing blood products here.  Only viable treatment at this time is blood transfusion.  [facility] has been contacted for other options at patient and family request.  Given another dose of EPO now.  Plan:  If no alternative therapies at [facility] requiring transfer, can d/c but if uncomfortable can admit.  Palliative care consult ordered.  12:00 AM No response from [facility] about options in several hours.  I spoke to the family, at this point they opted for admission for close monitoring.  Palliative care consult has been ordered for the morning.  She is still refusing blood products at this time.  12:20 AM Received call from [place], spoke with [facility], Dr. Al Decant-- has discussed case with oncology team at [facility], no new interventions that can be offered at this time.    Patient admitted to hospitalist service here.  Palliative to see in the morning.   Larene Pickett, PA-C 01/02/18 [place], MD 01/04/18 [phone]

## 2018-01-01 NOTE — ED Notes (Addendum)
Blood bank called and states they will need a form of refusal for blood products for this visit.

## 2018-01-01 NOTE — ED Triage Notes (Addendum)
Pt's doctor called and told her to come to hospital d/t low blood count, hx of breast cancer. Pt has had generalized weakness for last 3 weeks with sob on exertion. Pt reports blood in stool over the weekend. Denies blood thinners. Pt reports she would not accept blood transfusions, is a Jehovah's witness. Denies pain at this time.

## 2018-01-02 ENCOUNTER — Encounter (HOSPITAL_COMMUNITY): Payer: Self-pay | Admitting: *Deleted

## 2018-01-02 ENCOUNTER — Other Ambulatory Visit: Payer: Self-pay

## 2018-01-02 DIAGNOSIS — D696 Thrombocytopenia, unspecified: Secondary | ICD-10-CM

## 2018-01-02 DIAGNOSIS — J9601 Acute respiratory failure with hypoxia: Secondary | ICD-10-CM | POA: Diagnosis not present

## 2018-01-02 DIAGNOSIS — C7951 Secondary malignant neoplasm of bone: Secondary | ICD-10-CM | POA: Diagnosis not present

## 2018-01-02 DIAGNOSIS — I110 Hypertensive heart disease with heart failure: Secondary | ICD-10-CM | POA: Diagnosis not present

## 2018-01-02 DIAGNOSIS — C785 Secondary malignant neoplasm of large intestine and rectum: Secondary | ICD-10-CM | POA: Diagnosis not present

## 2018-01-02 DIAGNOSIS — D630 Anemia in neoplastic disease: Secondary | ICD-10-CM | POA: Diagnosis present

## 2018-01-02 DIAGNOSIS — I5030 Unspecified diastolic (congestive) heart failure: Secondary | ICD-10-CM | POA: Diagnosis not present

## 2018-01-02 DIAGNOSIS — C50919 Malignant neoplasm of unspecified site of unspecified female breast: Secondary | ICD-10-CM | POA: Diagnosis not present

## 2018-01-02 DIAGNOSIS — D61818 Other pancytopenia: Secondary | ICD-10-CM | POA: Diagnosis present

## 2018-01-02 DIAGNOSIS — Z789 Other specified health status: Secondary | ICD-10-CM | POA: Diagnosis present

## 2018-01-02 DIAGNOSIS — IMO0001 Reserved for inherently not codable concepts without codable children: Secondary | ICD-10-CM | POA: Diagnosis present

## 2018-01-02 DIAGNOSIS — D649 Anemia, unspecified: Secondary | ICD-10-CM | POA: Diagnosis not present

## 2018-01-02 DIAGNOSIS — Z531 Procedure and treatment not carried out because of patient's decision for reasons of belief and group pressure: Secondary | ICD-10-CM

## 2018-01-02 DIAGNOSIS — J40 Bronchitis, not specified as acute or chronic: Secondary | ICD-10-CM

## 2018-01-02 DIAGNOSIS — D709 Neutropenia, unspecified: Secondary | ICD-10-CM

## 2018-01-02 LAB — CBC
HEMATOCRIT: 13.1 % — AB (ref 36.0–46.0)
HEMOGLOBIN: 3.9 g/dL — AB (ref 12.0–15.0)
MCH: 27.3 pg (ref 26.0–34.0)
MCHC: 29.8 g/dL — ABNORMAL LOW (ref 30.0–36.0)
MCV: 91.6 fL (ref 80.0–100.0)
NRBC: 18.9 % — AB (ref 0.0–0.2)
Platelets: 93 10*3/uL — ABNORMAL LOW (ref 150–400)
RBC: 1.43 MIL/uL — AB (ref 3.87–5.11)
RDW: 22.6 % — AB (ref 11.5–15.5)
WBC: 2.6 10*3/uL — AB (ref 4.0–10.5)

## 2018-01-02 LAB — HIV ANTIBODY (ROUTINE TESTING W REFLEX): HIV Screen 4th Generation wRfx: NONREACTIVE

## 2018-01-02 LAB — OCCULT BLOOD, POC DEVICE: FECAL OCCULT BLD: NEGATIVE

## 2018-01-02 MED ORDER — CARVEDILOL 6.25 MG PO TABS
6.2500 mg | ORAL_TABLET | Freq: Two times a day (BID) | ORAL | Status: DC
  Administered 2018-01-02 – 2018-01-04 (×5): 6.25 mg via ORAL
  Filled 2018-01-02 (×5): qty 1

## 2018-01-02 MED ORDER — MONTELUKAST SODIUM 10 MG PO TABS
10.0000 mg | ORAL_TABLET | Freq: Every day | ORAL | Status: DC
  Administered 2018-01-02 – 2018-01-03 (×3): 10 mg via ORAL
  Filled 2018-01-02 (×3): qty 1

## 2018-01-02 MED ORDER — VITAMIN B-12 1000 MCG PO TABS
1000.0000 ug | ORAL_TABLET | Freq: Every day | ORAL | Status: DC
  Administered 2018-01-02 – 2018-01-04 (×3): 1000 ug via ORAL
  Filled 2018-01-02 (×3): qty 1

## 2018-01-02 MED ORDER — CAMPHOR-MENTHOL 0.5-0.5 % EX LOTN
TOPICAL_LOTION | CUTANEOUS | Status: DC | PRN
  Filled 2018-01-02: qty 222

## 2018-01-02 MED ORDER — TRAMADOL HCL 50 MG PO TABS: 50.0000 mg | ORAL_TABLET | Freq: Four times a day (QID) | ORAL | Status: DC | PRN

## 2018-01-02 MED ORDER — FLUTICASONE FUROATE-VILANTEROL 100-25 MCG/INH IN AEPB
1.0000 | INHALATION_SPRAY | Freq: Every day | RESPIRATORY_TRACT | Status: DC
  Administered 2018-01-02 – 2018-01-04 (×3): 1 via RESPIRATORY_TRACT
  Filled 2018-01-02: qty 28

## 2018-01-02 MED ORDER — CYANOCOBALAMIN 1000 MCG/ML IJ SOLN
1000.0000 ug | Freq: Once | INTRAMUSCULAR | Status: AC
  Administered 2018-01-02: 1000 ug via INTRAMUSCULAR
  Filled 2018-01-02: qty 1

## 2018-01-02 MED ORDER — DIPHENHYDRAMINE HCL 25 MG PO CAPS
25.0000 mg | ORAL_CAPSULE | Freq: Four times a day (QID) | ORAL | Status: DC | PRN
  Filled 2018-01-02: qty 1

## 2018-01-02 MED ORDER — AMLODIPINE BESYLATE 10 MG PO TABS
10.0000 mg | ORAL_TABLET | Freq: Every day | ORAL | Status: DC
  Administered 2018-01-02 – 2018-01-03 (×2): 10 mg via ORAL
  Filled 2018-01-02 (×2): qty 1

## 2018-01-02 MED ORDER — LORATADINE 10 MG PO TABS
10.0000 mg | ORAL_TABLET | Freq: Every day | ORAL | Status: DC
  Administered 2018-01-02 – 2018-01-04 (×3): 10 mg via ORAL
  Filled 2018-01-02 (×3): qty 1

## 2018-01-02 MED ORDER — DM-GUAIFENESIN ER 30-600 MG PO TB12
1.0000 | ORAL_TABLET | Freq: Two times a day (BID) | ORAL | Status: DC
  Administered 2018-01-02 – 2018-01-04 (×6): 1 via ORAL
  Filled 2018-01-02 (×6): qty 1

## 2018-01-02 MED ORDER — PANTOPRAZOLE SODIUM 40 MG PO TBEC
40.0000 mg | DELAYED_RELEASE_TABLET | Freq: Every day | ORAL | Status: DC
  Administered 2018-01-02 – 2018-01-04 (×3): 40 mg via ORAL
  Filled 2018-01-02 (×3): qty 1

## 2018-01-02 MED ORDER — IPRATROPIUM-ALBUTEROL 0.5-2.5 (3) MG/3ML IN SOLN: 3.0000 mL | Freq: Four times a day (QID) | RESPIRATORY_TRACT | Status: DC | PRN

## 2018-01-02 MED ORDER — FOLIC ACID 1 MG PO TABS
1.0000 mg | ORAL_TABLET | Freq: Every day | ORAL | Status: DC
  Administered 2018-01-02 – 2018-01-04 (×3): 1 mg via ORAL
  Filled 2018-01-02 (×3): qty 1

## 2018-01-02 MED ORDER — FAMOTIDINE 20 MG PO TABS: 20.0000 mg | ORAL_TABLET | Freq: Every day | ORAL | Status: DC | PRN

## 2018-01-02 NOTE — Progress Notes (Addendum)
Brief progress note For details, please refer to the well-documented H&P Dr. Marlowe Sax.   57 year old woman with a history of metastatic breast cancer, last seen at the cancer center on 12/23/2017, followed by Dr. Burr Medico, currently not on chemotherapy due to pancytopenia, held since 12/19/2017, with a hemoglobin of 4.7, and a white count of 1.6, ANC 0.7, platelets 48,000.  At the time, the patient was offered blood transfusion, declining, as she is Jehovah's Witness.  She is on Aranesp 300 mcg q. 2 weeks, with first dose on 12/29/2017. She was in her usual state of health, visiting [place] with family, when she began to be very short of breath, decided to come back to [facility] for further evaluation.  Her PCP, her hemoglobin was 4.1, white count was 2.4, platelets 84,000.  Repeat hemoglobin was 3.9.  For which she is was sent to the ED.  No bleeding issues were noted.  Recent Hemoccult was negative.  Once again, the patient refused blood transfusion, despite the severity of the situation.  She received erythropoietin at the ED.  Dr. Annamaria Boots to see the patient later today.  Patient is at high risk for shock due to severe anemia.  Appreciate Oncology follow-up.  Iron 182, TIBC 318, saturation 57, ferritin 935, folate 7.9.  B12 254.  Case was discused between EDP and Onc at [facility], with no other options to offer at this time at that facility, so no transfer is planned  Palliative care being considered regarding goals of care. General, chronically ill-appearing, NAD, alert and oriented, conversant Exam shows lungs essentially clear to auscultation without wheezing rhonchi or rales.  Left mastectomy noted in chest.  No tender areas in the chest wall Cardiac vascular regular rate and rhythm, without murmurs rubs or gallops Abdomen soft, nontender, bowel sounds x4 Extremities without edema.  No clubbing or cyanosis.  Plan, continue supportive care, oncology to see.

## 2018-01-02 NOTE — Care Management Obs Status (Signed)
[place] NOTIFICATION   Patient Details  Name: Michaela Little MRN: [id number] Date of Birth: 01/26/1961   Medicare Observation Status Notification Given:  Yes    Carles Collet, RN 01/02/2018, 2:11 PM

## 2018-01-02 NOTE — Progress Notes (Signed)
Marland Kitchen    HEMATOLOGY/ONCOLOGY CONSULTATION NOTE  Date of Service: 01/02/2018  Patient Care Team: [name] as PCP - General (Physician Assistant) [name], MD as Consulting Physician (Pulmonary Disease) [name], MD as Consulting Physician (Hematology)  CHIEF COMPLAINTS/PURPOSE OF CONSULTATION:  Worsening Anemia  HISTORY OF PRESENTING ILLNESS:   Michaela Little is a wonderful 57 y.o. female who has been referred to [place] by Dr Carolynne Edouard for evaluation and management of worsening symptomatic anemia/pancytopenia.  Patient is being seen by Dr. Burr Medico for metastatic breast cancer and was last seen in clinic on 12/23/2017 at which time she was noted to have pancytopenia with a hemoglobin of 4.7 with WBC count of 1.6 with an [place] of 0.7k and platelets of 48k.  This was being worked up but thought to be related to her [name] which has been held since 12/19/2017. She was offered blood transfusions but declined these in the clinic due to being a Jehovah's Witness.  She was recommended Aranesp which she initially declined and then chose to start.  She was to get Aranesp 300 mcg every 2 weeks and received the first dose which she reports on 12/29/2017.  Patient went to [place] to visit family but was feeling very short of breath with minimal exertion and very fatigued and decided to come back to [place].  She was seen by her primary care physician and had labs which showed similar results and therefore she was sent back to the emergency room by her primary care physician.  In the emergency room today she notes having possible blood in her stools and complaining of dizziness lightheadedness severe fatigue and significant exertional dyspnea.  Of note her previous fecal occult blood testing on 12/23/2017 was negative.  In the ED the patient's labs show that her hemoglobin dropped to 4.1 with an MCV of 91, WBC count was up to 2.4k and platelets were at 84k.   Dr. Kathrynn Humble followed by I  discussed in great details with the patient the life-threatening condition of her symptomatic anemia and the need for PRBC transfusion.  She had her Jehovah's Witness minister in the emergency room with her.  We discussed that without a PRBC transfusion she would be putting herself in a life-threatening condition given the concerns with GI bleeding and worsening anemia.  I discussed with her that I did not feel that Aranesp/EPO and IV iron would produce a question of response to diet over this concern especially in the setting of bone marrow suppression from treatment and the potential for bone marrow involvement by the breast cancer or treatment related MDS causing baseline pancytopenia in addition to the use of Ibrance.  She clearly unequivocally confirmed that she understood this concern and at this time declines any blood products or blood transfusions.  She subsequently decided that she might want to try EPO which I discussed with Dr. Kathrynn Humble and he was to order that.  We will have Dr. Burr Medico her primary oncologist f/u with her tomorrow if hospitalized. Goals of care discussion initiated since in her current condition with metastatic breast cancer she will have to determine how much of her work-up she needs for her pancytopenia and in the absence of an ability to transfuse her might need to focus on comfort cares with the worsening hemoglobin level. She is in discussion with multiple family members and ministers to help her through this process. They are also contemplating possible transfer to [facility] to determine if there is any  other blood alternatives. I discussed that we do not have any hemoglobin based oxygen carriers available to [place].  MEDICAL HISTORY:  Past Medical History:  Diagnosis Date  . Anxiety   . Arthritis    hands, knees, hips  . Asthma   . Bronchitis   . Cancer ([place])   . Cervical stenosis (uterine cervix)   . Disorder of appendix    Enlarged  . Dyspnea on exertion   . GERD  (gastroesophageal reflux disease)   . Headache(784.0)   . History of breast cancer    2010--  LEFT  s/p  mastectomy (in [place]) AND CHEMORADIATION--  NO RECURRENCE  . History of cervical dysplasia   . Hyperlipidemia   . Hypertension   . Malignant neoplasm of overlapping sites of left breast in female, estrogen receptor positive ([place]) 03/05/2013  . OSA (obstructive sleep apnea) moderate osa per study  09/2010   CPAP  , NOT USING ON REGULAR BASIS  . Pelvic pain in female   . Positive H. pylori test    08-05-2013  . Positive TB test    AS TEEN--  [place] W/ MEDS  . Refusal of blood transfusions as patient is Jehovah's Witness   . Seasonal allergies   . Uterine fibroid   . Wears glasses     SURGICAL HISTORY: Past Surgical History:  Procedure Laterality Date  . APPENDECTOMY  y  . CARDIOVASCULAR STRESS TEST  10-29-2012   low risk perfusion study/  no significant reversibity/ ef 66%/  normal wall motion  . CERVICAL CONIZATION W/BX  2012   in  [place] N/A 05/14/2012   Procedure: LAPAROSCOPIC CHOLECYSTECTOMY;  Surgeon: Ralene Ok, MD;  Location: [facility];  Service: General;  Laterality: N/A;  . COLONOSCOPY  2011   normal per patient - [place]  . DILATION AND CURETTAGE OF UTERUS  10/15/2011   Procedure: DILATATION AND CURETTAGE;  Surgeon: Melina Schools, MD;  Location: [facility];  Service: Gynecology;  Laterality: N/A;  Conization &  endocervical curettings  . EXAMINATION UNDER ANESTHESIA N/A 08/20/2013   Procedure: EXAM UNDER ANESTHESIA;  Surgeon: Margarette Asal, MD;  Location[facility];  Service: Gynecology;  Laterality: N/A;  . [place]   right  . LAPAROSCOPIC APPENDECTOMY N/A 08/22/2017   Procedure: [place];  Surgeon: Coralie Keens, MD;  Location: [facility];  Service: General;  Laterality: N/A;  . LAPAROSCOPIC ASSISTED VAGINAL HYSTERECTOMY N/A 10/26/2014   Procedure: HYSTERECTOMY ABDOMINAL ;  Surgeon: Molli Posey, MD;   Location: [facility];  Service: Gynecology;  Laterality: N/A;  . MASTECTOMY Left 11/2008  in [place]   . SALPINGOOPHORECTOMY Bilateral 10/26/2014   Procedure: BILATERAL SALPINGO OOPHORECTOMY;  Surgeon: Molli Posey, MD;  Location: [facility];  Service: Gynecology;  Laterality: Bilateral;  . TISSUE EXPANDER PLACEMENT Left 08/22/2017   Procedure: REMOVAL OF LEFT TISSUE EXPANDER;  Surgeon: Crissie Reese, MD;  Location: [facility];  Service: Plastics;  Laterality: Left;  . TISSUE EXPANDER REMOVAL Left 08/22/2017  . TRANSTHORACIC ECHOCARDIOGRAM  10-29-2012   mild lvh/  ef 60-65%/  grade II diastolic dysfunction/  trivial mr  &  tr    SOCIAL HISTORY: Social History   Socioeconomic History  . Marital status: Single    Spouse name: Not on file  . Number of children: 5  . Years of education: Not on file  . Highest education level: Not on file  Occupational History  .  Occupation: Disabled  Social Needs  . Financial resource strain: Not on file  . Food insecurity:    Worry: Not on file    Inability: Not on file  . Transportation needs:    Medical: Not on file    Non-medical: Not on file  Tobacco Use  . Smoking status: Former Smoker    Packs/day: 0.30    Years: 10.00    Pack years: 3.00    Types: Cigarettes    Last attempt to quit: 03/26/1988    Years since quitting: 29.7  . Smokeless tobacco: Never Used  Substance and Sexual Activity  . Alcohol use: No  . Drug use: No  . Sexual activity: Not Currently  Lifestyle  . Physical activity:    Days per week: Not on file    Minutes per session: Not on file  . Stress: Not on file  Relationships  . Social connections:    Talks on phone: Not on file    Gets together: Not on file    Attends religious service: Not on file    Active member of club or organization: Not on file    Attends meetings of clubs or organizations: Not on file    Relationship status: Not on file  . Intimate partner violence:    Fear of current or ex partner:  Not on file    Emotionally abused: Not on file    Physically abused: Not on file    Forced sexual activity: Not on file  Other Topics Concern  . Not on file  Social History Narrative  . Not on file    FAMILY HISTORY: Family History  Problem Relation Age of Onset  . Breast cancer Mother   . Colon cancer Mother   . Hypotension Mother   . Asthma Mother   . Diabetes type II Mother   . Arthritis Mother   . Clotting disorder Mother   . Cancer Mother        breast/colon  . Mental illness Brother   . Heart disease Brother   . Cerebral palsy Daughter   . Emphysema Brother        never smoker  . Colon cancer Maternal Aunt   . Cancer Maternal Aunt        colon  . Colon cancer Maternal Uncle   . Cancer Maternal Uncle        colon  . Esophageal cancer Neg Hx   . Rectal cancer Neg Hx   . Stomach cancer Neg Hx   . Thyroid disease Neg Hx     ALLERGIES:  is allergic to lisinopril-hydrochlorothiazide; adhesive [tape]; fentanyl; gabapentin; hctz [hydrochlorothiazide]; latex; lisinopril; losartan potassium; other; oxycodone; and tums [calcium carbonate antacid].  MEDICATIONS:  Current Facility-Administered Medications  Medication Dose Route Frequency Provider Last Rate Last Dose  . amLODipine (NORVASC) tablet 10 mg  10 mg Oral Daily Shela Leff, MD   10 mg at 01/02/18 0908  . carvedilol (COREG) tablet 6.25 mg  6.25 mg Oral BID WC Shela Leff, MD   6.25 mg at 01/02/18 0908  . dextromethorphan-guaiFENesin (MUCINEX DM) 30-600 MG per 12 hr tablet 1 tablet  1 tablet Oral BID Shela Leff, MD   1 tablet at 01/02/18 0909  . famotidine (PEPCID) tablet 20 mg  20 mg Oral Daily PRN Shela Leff, MD      . fluticasone furoate-vilanterol (BREO ELLIPTA) 100-25 MCG/INH 1 puff  1 puff Inhalation Daily Shela Leff, MD      . ipratropium-albuterol (  DUONEB) 0.5-2.5 (3) MG/3ML nebulizer solution 3 mL  3 mL Nebulization Q6H PRN Shela Leff, MD      . loratadine  (CLARITIN) tablet 10 mg  10 mg Oral Daily Shela Leff, MD   10 mg at 01/02/18 0908  . montelukast (SINGULAIR) tablet 10 mg  10 mg Oral QHS Shela Leff, MD   10 mg at 01/02/18 0223  . pantoprazole (PROTONIX) EC tablet 40 mg  40 mg Oral Daily Shela Leff, MD   40 mg at 01/02/18 0908    REVIEW OF SYSTEMS:    10 Point review of Systems was done is negative except as noted above.  PHYSICAL EXAMINATION: ECOG PERFORMANCE STATUS: 4 - Bedbound  . Vitals:   01/02/18 0224 01/02/18 0508  BP: (!) 149/72 (!) 121/49  Pulse: 92 85  Resp: 18 18  Temp: 97.8 F (36.6 C) 98 F (36.7 C)  SpO2: 99% 100%   Filed Weights   01/01/18 1628  Weight: 260 lb (117.9 kg)   .Body mass index is 40.72 kg/m.  GENERAL:alert, in no acute distress and comfortable SKIN: no acute rashes, no significant lesions EYES: conjunctiva are pink and non-injected, sclera anicteric OROPHARYNX: MMM, no exudates, no oropharyngeal erythema or ulceration NECK: supple, no JVD LYMPH:  no palpable lymphadenopathy in the cervical, axillary or inguinal regions LUNGS: clear to auscultation b/l with normal respiratory effort HEART: regular rate & rhythm ABDOMEN:  normoactive bowel sounds , non tender, not distended. Extremity: no pedal edema PSYCH: alert & oriented x 3 with fluent speech NEURO: no focal motor/sensory deficits  LABORATORY DATA:  I have reviewed the data as listed  . CBC Latest Ref Rng & Units 01/01/2018 12/31/2017  WBC 4.0 - 10.5 K/uL 2.4(L) 2.5(LL)  Hemoglobin 12.0 - 15.0 g/dL 4.1(LL) 4.2(LL)  Hematocrit 36.0 - 46.0 % 13.5(L) 13.1(LL)  Platelets 150 - 400 K/uL 84(L) 108(L)    CMP Latest Ref Rng & Units 01/01/2018 12/23/2017 12/19/2017  Glucose 70 - 99 mg/dL 133(H) 116(H) 130(H)  BUN 6 - 20 mg/dL 6 12 11   Creatinine 0.44 - 1.00 mg/dL 0.68 0.82 0.80  Sodium 135 - 145 mmol/L 137 137 140  Potassium 3.5 - 5.1 mmol/L 3.5 4.0 3.9  Chloride 98 - 111 mmol/L 109 105 109  CO2 22 - 32 mmol/L 21(L)  25 24  Calcium 8.9 - 10.3 mg/dL 8.9 9.0 9.0  Total Protein 6.5 - 8.1 g/dL - 7.5 7.0  Total Bilirubin 0.3 - 1.2 mg/dL - 2.6(H) 1.4(H)  Alkaline Phos 38 - 126 U/L - 136(H) 130(H)  AST 15 - 41 U/L - 27 27  ALT 0 - 44 U/L - 17 18   Component     Latest Ref Rng & Units 01/01/2018  Iron     28 - 170 ug/dL 182 (H)  TIBC     250 - 450 ug/dL 318  Saturation Ratios     10.4 - 31.8 % 57 (H)  UIBC     ug/dL 136  Vitamin B12     180 - 914 pg/mL 254  Folate     >5.9 ng/mL 7.9  Ferritin     11 - 307 ng/mL 935 (H)    RADIOGRAPHIC STUDIES: I have personally reviewed the radiological images as listed and agreed with the findings in the report. Dg Chest 2 View  Result Date: 12/23/2017 CLINICAL DATA:  Anemia.  Tachycardia.  Cough. EXAM: CHEST - 2 VIEW COMPARISON:  October 18, 2017 FINDINGS: There is no edema or consolidation.  Heart size and pulmonary vascularity are normal. No adenopathy. Patient is status post left mastectomy with surgical clips in left axillary region. No blastic or lytic bone lesions evident. IMPRESSION: Status post left mastectomy. No edema or consolidation. No evident adenopathy. Electronically Signed   By: Lowella Grip III M.D.   On: 12/23/2017 11:50   Dg Chest Port 1 View  Result Date: 01/01/2018 CLINICAL DATA:  Low blood count.  History of breast cancer. EXAM: PORTABLE CHEST 1 VIEW COMPARISON:  December 23, 2017 FINDINGS: The heart size and mediastinal contours are stable. The heart size is enlarged. Both lungs are clear. Patient status post prior left mastectomy with left axillary dissection. Degenerative joint changes of the spine are noted. IMPRESSION: No active cardiopulmonary disease. Electronically Signed   By: Abelardo Diesel M.D.   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 #2 pancytopenia-at  least partly due to her recent Ibrance use.  In the setting of metastatic breast cancer cannot rule out the possibility of myelophthisic anemia from bone marrow infiltration or from her treatment related MDS. #3 metastatic breast cancer- -managed by Dr. Burr Medico #4 Jehovah's Witness declining blood products completely at this time. Plan -I spent a significant of  time with the patient and her pastor to discuss the available lab results and the very concerning nature of her symptomatic anemia. -She is very high risk for going into shock due to severe anemia.  She clearly declines PRBC transfusions at this time. -She has received first dose of Aranesp 300 mcg on 12/29/2017. -We will check iron/ferritin K81 and folic acid and replace as per results. -E75 and folic acid levels were somewhat low and ordered replacements for these. -Ferritin is 935 no clear role for IV iron at this time. -LDH was previously checked on 12/23/2017 and was noted to be elevated likely from tissue injury and possible increased bone marrow turnover but haptoglobin was within normal limits and suggest against hemolytic process. -Her breast cancer tumor marker suggests disease progression. -Initial goal goals of care discussion regarding her pancytopenia and breast cancer. -We will let Dr.Feng know to f/u on patient tomorrow if she is in the hospital. -grave prognosis. -case discussed with Dr Kathrynn Humble   All of the patients questions were answered with apparent satisfaction. The patient knows to call the clinic with any problems, questions or concerns.    Sullivan Lone MD [place] AAHIVMS [place] [facility] Hematology/Oncology Physician [facility]  (Office):       [phone] (Work cell):  [phone] (Fax):           [phone]

## 2018-01-02 NOTE — Progress Notes (Signed)
Came to room x 2 for DPI- Breo.  Pt unavail x 2.  Will check back later.

## 2018-01-02 NOTE — Progress Notes (Signed)
Michaela Little   DOB:May 28, 1960   FM#:[id number]   [id number]  Oncology follow up   Subjective: Pt is well known to me, and under my care for her metastatic breast cancer. She recently developed severe pancytopenia likely secondary to [place]. She was sent to [facility] by her PCP yesterday due to severe anemia.    Objective:  Vitals:   01/02/18 1637 01/02/18 2246  BP: 138/69 135/63  Pulse: 82 80  Resp: 19 18  Temp:  98.6 F (37 C)  SpO2: 100% 100%    Body mass index is 40.72 kg/m.  Intake/Output Summary (Last 24 hours) at 01/02/2018 2316 Last data filed at 01/02/2018 1834 Gross per 24 hour  Intake 960 ml  Output 0 ml  Net 960 ml     Sclerae unicteric  Oropharynx clear  No peripheral adenopathy  Lungs clear -- no rales or rhonchi  Heart regular rate and rhythm  Abdomen benign  MSK no focal spinal tenderness, no peripheral edema  Neuro nonfocal   CBG (last 3)  No results for input(s): GLUCAP in the last 72 hours.   Labs:  Lab Results  Component Value Date   WBC 2.6 (L) 01/02/2018   HGB 3.9 (LL) 01/02/2018   HCT 13.1 (L) 01/02/2018   MCV 91.6 01/02/2018   PLT 93 (L) 01/02/2018   NEUTROABS 1.0 (L) 01/01/2018   CMP Latest Ref Rng & Units 01/01/2018 12/23/2017 12/19/2017  Glucose 70 - 99 mg/dL 133(H) 116(H) 130(H)  BUN 6 - 20 mg/dL 6 12 11   Creatinine 0.44 - 1.00 mg/dL 0.68 0.82 0.80  Sodium 135 - 145 mmol/L 137 137 140  Potassium 3.5 - 5.1 mmol/L 3.5 4.0 3.9  Chloride 98 - 111 mmol/L 109 105 109  CO2 22 - 32 mmol/L 21(L) 25 24  Calcium 8.9 - 10.3 mg/dL 8.9 9.0 9.0  Total Protein 6.5 - 8.1 g/dL - 7.5 7.0  Total Bilirubin 0.3 - 1.2 mg/dL - 2.6(H) 1.4(H)  Alkaline Phos 38 - 126 U/L - 136(H) 130(H)  AST 15 - 41 U/L - 27 27  ALT 0 - 44 U/L - 17 18     Urine Studies No results for input(s): UHGB, CRYS in the last 72 hours.  Invalid input(s): UACOL, UAPR, USPG, UPH, UTP, UGL, UKET, UBIL, UNIT, UROB, ULEU, UEPI, UWBC, URBC, UBAC, CAST, UCOM, BILUA  Basic Metabolic  Panel: Recent Labs  Lab 01/01/18 1637  NA 137  K 3.5  CL 109  CO2 21*  GLUCOSE 133*  BUN 6  CREATININE 0.68  CALCIUM 8.9   GFR Estimated Creatinine Clearance: 100.5 mL/min (by C-G formula based on SCr of 0.68 mg/dL). Liver Function Tests: No results for input(s): AST, ALT, ALKPHOS, BILITOT, PROT, ALBUMIN in the last 168 hours. No results for input(s): LIPASE, AMYLASE in the last 168 hours. No results for input(s): AMMONIA in the last 168 hours. Coagulation profile Recent Labs  Lab 01/01/18 1637  INR 1.25    CBC: Recent Labs  Lab 12/31/17 1154 01/01/18 1637 01/02/18 0451  WBC 2.5* 2.4* 2.6*  NEUTROABS 1.0* 1.0*  --   HGB 4.2* 4.1* 3.9*  HCT 13.1* 13.5* 13.1*  MCV 87 91.2 91.6  PLT 108* 84* 93*   Cardiac Enzymes: Recent Labs  Lab 01/01/18 1838  TROPONINI <0.03   BNP: Invalid input(s): POCBNP CBG: No results for input(s): GLUCAP in the last 168 hours. D-Dimer No results for input(s): DDIMER in the last 72 hours. Hgb A1c No results for input(s): HGBA1C in  the last 72 hours. Lipid Profile No results for input(s): CHOL, HDL, LDLCALC, TRIG, CHOLHDL, LDLDIRECT in the last 72 hours. Thyroid function studies No results for input(s): TSH, T4TOTAL, T3FREE, THYROIDAB in the last 72 hours.  Invalid input(s): FREET3 Anemia work up Recent Labs    01/01/18 1838  VITAMINB12 254  FOLATE 7.9  FERRITIN 935*  TIBC 318  IRON 182*  RETICCTPCT 4.2*   Microbiology No results found for this or any previous visit (from the past 240 hour(s)).    Studies:  Dg Chest Port 1 View  Result Date: 01/01/2018 CLINICAL DATA:  Low blood count.  History of breast cancer. EXAM: PORTABLE CHEST 1 VIEW COMPARISON:  December 23, 2017 FINDINGS: The heart size and mediastinal contours are stable. The heart size is enlarged. Both lungs are clear. Patient status post prior left mastectomy with left axillary dissection. Degenerative joint changes of the spine are noted. IMPRESSION: No  active cardiopulmonary disease. Electronically Signed   By: Abelardo Diesel M.D.   On: 01/01/2018 18:40    Assessment: 57 y.o. female with metastatic breast ca, recently developed severe pancytopenia   1. Severe anemia, likely secondary to Ibrance 2. Neutropenia and thrombocytopenia, improving  3. Bronchitis  4. 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 HTN   Plan:  -Her pancytopenia is likely related to [facility], which has been held since 12/19/2017 -she is Jehovah's Witness and declines blood transfusion, she understands that her severe anemia may cause heart attack, heart failure or even shock -I have started her on Aranesp injection on 9/30, and she received second dose 340mg on 10/9 -she also received B12 injection yesterday and on oral BR48and folic acid  -iron study showed elevated iron level, no need iron supplement  -If her anemia does not improve in the next 2 weeks, will obtian a bone marrow biopsy to rule out diffuse bone metastasis  -I recommend her to continue exemestane  -please use pediatric tube for blood draw, and no need daily CBC  -she does not want to go to SNF, prefers to be discharged home. I previously set up home care for her, please continue home care  -I will f/u as needed    YTruitt Merle MD 01/02/2018

## 2018-01-02 NOTE — H&P (Signed)
History and Physical    Michaela [facility] JJH:[id number] DOB: December 03, 1960 DOA: 01/01/2018  PCP: Clent Demark, PA-C Patient coming from: [facility]  Chief Complaint: Sent by PCP for further evaluation of low hemoglobin on labs  HPI: Michaela [facility] is a 57 y.o. female with medical history significant of hypertension, lipidemia, asthma, breast cancer with metastasis, received oral chemotherapy with last dose toward the end of September being sent to the ED by her PCPs office for further evaluation of low hemoglobin on labs.  Patient reports having generalized lightheadedness, weakness, dyspnea on exertion, chest pressure with exertion, and palpitations for the past 3 weeks.  Also reports noticing blood in her stool 2 weeks ago.  Denies having any episodes of syncope.  States she has been coughing for the past 2 weeks and the cough is productive of yellow-colored sputum.  Denies having any fevers.  States Michaela [facility] was stopped last week because her blood counts were dropping.  States she is being followed by Dr. Burr Medico (oncology).  Patient reports being a Jehovah's Witness and states she does not want a blood transfusion.  ED Course: In the ED, patient noted to have hemoglobin 4.1.  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  Also noted to be leukopenic and thrombocytopenic.    ED provider informed me that they spoke to GI and they are not planning to scope the patient at this time.  FOBT negative.  ED provider also informed me that they spoke to heme-onc here and recommendation was to give erythropoietin.  Family members requested that the ED provider called [facility] to see if they have any bloodless substitutes.  I was informed by the ED provider that they spoke to medicine service at [facility] who discussed the case with oncology there and concluded that they have nothing additional to offer. TRH paged to admit.  Review of Systems: As per HPI otherwise 10 point review of  systems negative.  Past Medical History:  Diagnosis Date  . Anxiety   . Arthritis    hands, knees, hips  . Asthma   . Bronchitis   . Cancer ([place])   . Cervical stenosis (uterine cervix)   . Disorder of appendix    Enlarged  . Dyspnea on exertion   . GERD (gastroesophageal reflux disease)   . Headache(784.0)   . History of breast cancer    2010--  LEFT  s/p  mastectomy (in [place]) AND CHEMORADIATION--  NO RECURRENCE  . History of cervical dysplasia   . Hyperlipidemia   . Hypertension   . Malignant neoplasm of overlapping sites of left breast in female, estrogen receptor positive ([place]) 03/05/2013  . OSA (obstructive sleep apnea) moderate osa per study  09/2010   CPAP  , NOT USING ON REGULAR BASIS  . Pelvic pain in female   . Positive H. pylori test    08-05-2013  . Positive TB test    AS TE[place]--  [place]/ MEDS  . Refusal of blood transfusions as patient is Jehovah's Witness   . Seasonal allergies   . Uterine fibroid   . Wears glasses     Past Surgical History:  Procedure Laterality Date  . APPENDECTOMY  y  . CARDIOVASCULAR STRESS TEST  10-29-2012   low risk perfusion study/  no significant reversibity/ ef 66%/  normal wall motion  . CERVICAL CONIZATION W/BX  2012   in  [place] N/A 05/14/2012   Procedure: LAPAROSCOPIC  CHOLECYSTECTOMY;  Surgeon: Ralene Ok, MD;  Location: [facility];  Service: General;  Laterality: N/A;  . COLONOSCOPY  2011   normal per patient - [place]  . DILATION AND CURETTAGE OF UTERUS  10/15/2011   Procedure: DILATATION AND CURETTAGE;  Surgeon: Melina Schools, MD;  Location: [facility];  Service: Gynecology;  Laterality: N/A;  Conization &  endocervical curettings  . EXAMINATION UNDER ANESTHESIA N/A 08/20/2013   Procedure: EXAM UNDER ANESTHESIA;  Surgeon: Margarette Asal, MD;  Location: [facility];  Service: Gynecology;  Laterality: N/A;  . [place]   right  . LAPAROSCOPIC APPENDECTOMY N/A 08/22/2017   Procedure: [place];  Surgeon: Coralie Keens, MD;  Location: [place];  Service: General;  Laterality: N/A;  . LAPAROSCOPIC ASSISTED VAGINAL HYSTERECTOMY N/A 10/26/2014   Procedure: HYSTERECTOMY ABDOMINAL ;  Surgeon: Molli Posey, MD;  Location: [facility];  Service: Gynecology;  Laterality: N/A;  . MASTECTOMY Left 11/2008  in [place]   . SALPINGOOPHORECTOMY Bilateral 10/26/2014   Procedure: BILATERAL SALPINGO OOPHORECTOMY;  Surgeon: Molli Posey, MD;  Location: [facility];  Service: Gynecology;  Laterality: Bilateral;  . TISSUE EXPANDER PLACEMENT Left 08/22/2017   Procedure: REMOVAL OF LEFT TISSUE EXPANDER;  Surgeon: Crissie Reese, MD;  Location: [facility];  Service: Plastics;  Laterality: Left;  . TISSUE EXPANDER REMOVAL Left 08/22/2017  . TRANSTHORACIC ECHOCARDIOGRAM  10-29-2012   mild lvh/  ef 60-65%/  grade II diastolic dysfunction/  trivial mr  &  tr     reports that she quit smoking about 29 years ago. Her smoking use included cigarettes. She has a 3.00 pack-year smoking history. She has never used smokeless tobacco. She reports that she does not drink alcohol or use drugs.  Allergies  Allergen Reactions  . Lisinopril-Hydrochlorothiazide Itching  . Adhesive [Tape] Itching and Other (See Comments)    Redness  . Fentanyl Other (See Comments)    GI upset and drowsiness  . Gabapentin Other (See Comments)    Auditory hallucinations  . Hctz [Hydrochlorothiazide] Itching  . Latex Itching  . Lisinopril Itching  . Losartan Potassium Other (See Comments)    Makes her feel "bad"   . Other Other (See Comments)    Patient refuses blood for religious reasons (per her notes)  . Oxycodone     GI upset, drowsy  . Tums [Calcium Carbonate Antacid] Hives    FRUIT-FLAVORED ONES    Family History  Problem Relation Age of Onset  . Breast cancer Mother   . Colon cancer Mother   . Hypotension Mother   . Asthma Mother   . Diabetes type II Mother   . Arthritis Mother   .  Clotting disorder Mother   . Cancer Mother        breast/colon  . Mental illness Brother   . Heart disease Brother   . Cerebral palsy Daughter   . Emphysema Brother        never smoker  . Colon cancer Maternal Aunt   . Cancer Maternal Aunt        colon  . Colon cancer Maternal Uncle   . Cancer Maternal Uncle        colon  . Esophageal cancer Neg Hx   . Rectal cancer Neg Hx   . Stomach cancer Neg Hx   . Thyroid disease Neg Hx     Prior to Admission medications   Medication Sig Start Date End Date  Taking? Authorizing Provider  amLODipine (NORVASC) 10 MG tablet Take 1 tablet (10 mg total) by mouth daily. 09/24/17   Clent Demark, PA-C  benzonatate (TESSALON) 100 MG capsule Take 1 capsule (100 mg total) by mouth 2 (two) times daily as needed for cough. Patient not taking: Reported on 12/23/2017 10/18/17   Martyn Ehrich, NP  carvedilol (COREG) 6.25 MG tablet Take 1 tablet (6.25 mg total) by mouth 2 (two) times daily with a meal. 12/19/17   Clent Demark, PA-C  cetirizine (ZYRTEC) 10 MG tablet Take 10 mg by mouth daily. 12/19/17   [provider]  exemestane (AROMASIN) 25 MG tablet Take 1 tablet (25 mg total) by mouth daily after breakfast. 11/18/17   [name], MD  fluticasone furoate-vilanterol (BREO ELLIPTA) 100-25 MCG/INH AEPB Inhale 1 puff into the lungs daily. 10/18/17   Martyn Ehrich, NP  furosemide (LASIX) 20 MG tablet Take 1 tablet (20 mg total) by mouth daily as needed (for fluid retention.). 09/25/17   Clent Demark, PA-C  IBRANCE 75 MG capsule TAKE ONE CAPSULE BY MOUTH DAILY WITH FOOD FOR 21 DAYS OF A 28 DAY CYCLE. SWALLOW WHOLE. DO NOT TAKE IF CAPSULES ARE BROKEN OR CRACKED. AVOID Patient taking differently: Take 75 mg by mouth See admin instructions. ta 12/04/17   [name], MD  ipratropium-albuterol (DUONEB) 0.5-2.5 (3) MG/3ML SOLN Take 3 mLs by nebulization every 6 (six) hours as needed. 09/25/17   Clent Demark, PA-C  loratadine (CLARITIN) 10 MG  tablet Take 20 mg by mouth daily.     [provider]  montelukast (SINGULAIR) 10 MG tablet Take 1 tablet (10 mg total) by mouth at bedtime. 10/10/17   Clent Demark, PA-C  naproxen (NAPROSYN) 500 MG tablet Take 1 tablet (500 mg total) by mouth 2 (two) times daily with a meal. 04/12/17   Clent Demark, PA-C  pantoprazole (PROTONIX) 40 MG tablet Take 1 tablet (40 mg total) by mouth daily. Take 30-60 min before first meal of the day 01/01/17   [name], MD  predniSONE (DELTASONE) 10 MG tablet 2 tabs daily x 5 days 10/18/17   Martyn Ehrich, NP  promethazine (PHENERGAN) 25 MG tablet Take 1 tablet (25 mg total) by mouth every 6 (six) hours as needed for nausea or vomiting. 12/19/17   Alla Feeling, NP  ranitidine (ZANTAC) 300 MG tablet Take 1 tablet (300 mg total) by mouth at bedtime. Patient taking differently: Take 300 mg by mouth at bedtime as needed (for indigestion.).  12/05/16   Kozlow, Donnamarie Poag, MD  traMADol (ULTRAM) 50 MG tablet Take 1-2 tablets (50-100 mg total) by mouth every 6 (six) hours as needed for moderate pain or severe pain. 09/05/17   [name], MD    Physical Exam: Vitals:   01/01/18 2300 01/01/18 2315 01/01/18 2345 01/02/18 0224  BP: (!) 158/63 (!) 146/72 (!) 171/73 (!) 149/72  Pulse: 100 94 97 92  Resp: (!) 23 15 (!) 23 18  Temp:      TempSrc:      SpO2: 100% 100% 99% 99%  Weight:      Height:       Physical Exam  Constitutional: She is oriented to person, place, and time. She appears well-developed and well-nourished.  Coughing  HENT:  Head: Normocephalic and atraumatic.  Mouth/Throat: Oropharynx is clear and moist.  Eyes: Right eye exhibits no discharge. Left eye exhibits no discharge.  Neck: Neck supple. No tracheal deviation  present.  Cardiovascular: Normal rate, regular rhythm and intact distal pulses. Exam reveals no gallop and no friction rub.  Murmur heard. Pulmonary/Chest: Effort normal and breath sounds normal. No respiratory  distress. She has no wheezes. She has no rales.  Abdominal: Soft. Bowel sounds are normal. She exhibits no distension. There is no tenderness.  Musculoskeletal: She exhibits no edema.  Neurological: She is alert and oriented to person, place, and time.  Skin: Skin is warm and dry.  Psychiatric: Her behavior is normal.     Labs on Admission: I have personally reviewed following labs and imaging studies  CBC: Recent Labs  Lab 12/31/17 1154 01/01/18 1637  WBC 2.5* 2.4*  NEUTROABS 1.0* 1.0*  HGB 4.2* 4.1*  HCT 13.1* 13.5*  MCV 87 91.2  PLT 108* 84*   Basic Metabolic Panel: Recent Labs  Lab 01/01/18 1637  NA 137  K 3.5  CL 109  CO2 21*  GLUCOSE 133*  BUN 6  CREATININE 0.68  CALCIUM 8.9   GFR: Estimated Creatinine Clearance: 100.5 mL/min (by C-G formula based on SCr of 0.68 mg/dL). Liver Function Tests: No results for input(s): AST, ALT, ALKPHOS, BILITOT, PROT, ALBUMIN in the last 168 hours. No results for input(s): LIPASE, AMYLASE in the last 168 hours. No results for input(s): AMMONIA in the last 168 hours. Coagulation Profile: Recent Labs  Lab 01/01/18 1637  INR 1.25   Cardiac Enzymes: Recent Labs  Lab 01/01/18 1838  TROPONINI <0.03   BNP (last 3 results) No results for input(s): PROBNP in the last 8760 hours. HbA1C: No results for input(s): HGBA1C in the last 72 hours. CBG: No results for input(s): GLUCAP in the last 168 hours. Lipid Profile: No results for input(s): CHOL, HDL, LDLCALC, TRIG, CHOLHDL, LDLDIRECT in the last 72 hours. Thyroid Function Tests: No results for input(s): TSH, T4TOTAL, FREET4, T3FREE, THYROIDAB in the last 72 hours. Anemia Panel: Recent Labs    01/01/18 1838  VITAMINB12 254  FOLATE 7.9  FERRITIN 935*  TIBC 318  IRON 182*  RETICCTPCT 4.2*   Urine analysis:    Component Value Date/Time   COLORURINE YELLOW 08/27/2017 2344   APPEARANCEUR CLEAR 08/27/2017 2344   LABSPEC >1.046 (H) 08/27/2017 2344   LABSPEC 1.025  01/11/2016 1233   PHURINE 6.0 08/27/2017 2344   GLUCOSEU NEGATIVE 08/27/2017 2344   GLUCOSEU Negative 01/11/2016 1233   HGBUR NEGATIVE 08/27/2017 2344   BILIRUBINUR NEGATIVE 08/27/2017 2344   BILIRUBINUR small 12/03/2016 1117   BILIRUBINUR Negative 01/11/2016 [place] 08/27/2017 2344   PROTEINUR NEGATIVE 08/27/2017 2344   UROBILINOGEN 1.0 12/03/2016 1117   UROBILINOGEN 1.0 09/13/2016 1306   UROBILINOGEN 0.2 01/11/2016 1233   NITRITE NEGATIVE 08/27/2017 2344   LEUKOCYTESUR NEGATIVE 08/27/2017 2344   LEUKOCYTESUR Small 01/11/2016 1233    Radiological Exams on Admission: Dg Chest Port 1 View  Result Date: 01/01/2018 CLINICAL DATA:  Low blood count.  History of breast cancer. EXAM: PORTABLE CHEST 1 VIEW COMPARISON:  December 23, 2017 FINDINGS: The heart size and mediastinal contours are stable. The heart size is enlarged. Both lungs are clear. Patient status post prior left mastectomy with left axillary dissection. Degenerative joint changes of the spine are noted. IMPRESSION: No active cardiopulmonary disease. Electronically Signed   By: Abelardo Diesel M.D.   On: 01/01/2018 18:40    Assessment/Plan Principal Problem:   Symptomatic anemia Active Problems:   Hypertension   GERD (gastroesophageal reflux disease)   Pancytopenia (HCC)   Bronchitis   Symptomatic  anemia and pancytopenia -Patient reports having generalized lightheadedness, weakness, dyspnea on exertion, chest pressure with exertion, and palpitations for the past 3 weeks.  Also reports noticing blood in her stool 2 weeks ago. -Hemoglobin 4.1, white count 2.4, and platelets 84,000.  Pancytopenia noted on previous labs as well, likely due to marrow suppression from chemotherapy Michaela [facility] which was stopped on December 19, 2017). -I-STAT troponin negative.  EKG not done in ED. Will order it at this time.  Currently chest pain-free.  -FOBT negative.  ED provider spoke to GI and they are not planning to scope at  this time.  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 ED provider informed me that they spoke to heme-onc here and recommendation was to give Aranesp.  She received a dose in the ED. -Repeat CBC in a.m. -Palliative care consult  Bronchitis -Cough productive of yellow sputum for the past 2 weeks -Afebrile.  Leukopenic in the setting of recent chemo.  Chest x-ray without signs of acute infection.  Lungs clear on exam. 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 Burr Medico (oncology).  Please read her previous note from December 23, 2017. -Michaela [facility] stopped December 19, 2017 -Patient informed me she has not taken exemestane since her blood counts have dropped.  Treatment plan need to be discussed with oncology in the morning.   Hypertension -Continue [facility] amlodipine and Coreg  GERD -Continue PPI and H2 blocker  DVT prophylaxis: SCDs Code Status: Patient wishes to be full code. Family Communication: Daughter at bedside was sleeping and could not be updated at this time. Disposition Plan: Anticipate discharge in 1 to 2 days to [facility]. Consults called: Heme-onc (Dr. Irene Limbo), palliative care Admission status: Observation   Shela Leff MD [facility] Pager [phone]  If 7PM-7AM, please contact night-coverage [email or web address] Password [email or web address]  01/02/2018, 3:02 AM

## 2018-01-02 NOTE — Care Management Note (Addendum)
Case Management Note  Patient Details  Name: SAAYA PROCELL MRN: [id number] Date of Birth: 05-25-1960  Subjective/Objective:                 Pancytopenia, Hgb decreased today to 3.9 from 4.1 on admit, platelets RBC and WBC low as well. Patient is Jehovah's Witness.    Action/Plan:  Spoke to patient at bedside. She was very easily fatigued trying to get into bed.  Discussed Hgb. She called an Elder at her church about the severity of her low Hgb. He is requesting the council there to consider granting her a pardon so she can receive a transfusion.  CM is following.  Expected Discharge Date:  01/03/18               Expected Discharge Plan:     In-House Referral:     Discharge planning Services  CM Consult  Post Acute Care Choice:    Choice offered to:     DME Arranged:    DME Agency:     HH Arranged:    HH Agency:     Status of Service:  In process, will continue to follow  If discussed at Long Length of Stay Meetings, dates discussed:    Additional Comments:  Carles Collet, RN 01/02/2018, 1:31 PM

## 2018-01-03 ENCOUNTER — Telehealth: Payer: Self-pay

## 2018-01-03 ENCOUNTER — Ambulatory Visit: Payer: Medicare Other | Admitting: Hematology

## 2018-01-03 ENCOUNTER — Other Ambulatory Visit: Payer: Medicare Other

## 2018-01-03 DIAGNOSIS — C50912 Malignant neoplasm of unspecified site of left female breast: Secondary | ICD-10-CM

## 2018-01-03 DIAGNOSIS — R0609 Other forms of dyspnea: Secondary | ICD-10-CM

## 2018-01-03 DIAGNOSIS — D649 Anemia, unspecified: Secondary | ICD-10-CM | POA: Diagnosis not present

## 2018-01-03 LAB — CBC
HEMATOCRIT: 12.7 % — AB (ref 36.0–46.0)
Hemoglobin: 3.9 g/dL — CL (ref 12.0–15.0)
MCH: 28.3 pg (ref 26.0–34.0)
MCHC: 30.7 g/dL (ref 30.0–36.0)
MCV: 92 fL (ref 80.0–100.0)
NRBC: 0 % (ref 0.0–0.2)
PLATELETS: 80 10*3/uL — AB (ref 150–400)
RBC: 1.38 MIL/uL — AB (ref 3.87–5.11)
RDW: 22.3 % — ABNORMAL HIGH (ref 11.5–15.5)
WBC: 3.1 10*3/uL — ABNORMAL LOW (ref 4.0–10.5)

## 2018-01-03 NOTE — Progress Notes (Signed)
SATURATION QUALIFICATIONS: (This note is used to comply with regulatory documentation for home oxygen)  Patient Saturations on Room Air at Rest = 99%  Patient Saturations on Room Air while Ambulating = 90%  Patient Saturations on 2L=97%  Please briefly explain why patient needs home oxygen: Patient has symptomatic anemia and is SOB while ambulating.

## 2018-01-03 NOTE — Progress Notes (Signed)
SATURATION QUALIFICATIONS: (This note is used to comply with regulatory documentation for home oxygen)  Patient Saturations on Room Air at Rest = 99%  Patient Saturations on Room Air while Ambulating = 84%  Patient Saturations on 2 Liters of oxygen while Ambulating = 98%  Please briefly explain why patient needs home oxygen: Six minute walk test completed. Patient had HR of 101, increased weakness and shortness of breath.

## 2018-01-03 NOTE — Care Management (Signed)
.      Durable Medical Equipment  (From admission, onward)         Start     Ordered   01/03/18 1440  For home use only DME oxygen  Once    Comments:  Patient has severe anemia and due to religious beliefs, will not take blood products, needs O2 to help with oxygenation  Question Answer Comment  Mode or (Route) Nasal cannula   Liters per Minute 2   Frequency Continuous (stationary and portable oxygen unit needed)   Oxygen delivery system Gas      01/03/18 1440   01/03/18 1421  For home use only DME 3 n 1  Once     01/03/18 1420   01/03/18 1421  For home use only DME Shower stool  Once     01/03/18 1420   01/03/18 1418  For home use only DME Hospital bed  Once    Comments:  increased weakness due to severe anemia, pain in chest from breast cancer which requires patient to change position to assist with pain  Question Answer Comment  Patient has (list medical condition): Pancytopenia, weakness, breast cancer   The above medical condition requires: Patient requires the ability to reposition frequently   Head must be elevated greater than: Other see comments   Bed type Semi-electric   Trapeze Bar Yes      01/03/18 1419

## 2018-01-03 NOTE — Progress Notes (Signed)
Patient and her stated that they wanted to be D/C'd home tomorrow. Eliseo Squires, MD notified.

## 2018-01-03 NOTE — Telephone Encounter (Signed)
Faxed signed [place] [facility] and [facility] to [facility].

## 2018-01-03 NOTE — Care Management Note (Addendum)
Case Management Note  Patient Details  Name: Michaela [facility] MRN: [id number] Date of Birth: March 02, 1961  Subjective/Objective:    Pancytopenia, metastatic breast cancer                Action/Plan: NCM spoke to pt and dtr at bedside. Pt active with [facility] for HHRN. Added aide and SW. Declines SNF.  Pt lives at home alone. Dtr is requesting [place] Medicaid PCS. Faxed [place] Medicaid PCS forms to PCP, Domenica Fail PA to complete. Pt has RW, neb machine and CPAP at home. Contacted [facility] for DME, hospital bed, shower stool, oxygen 3n1 bedside commode and HH. [facility] will deliver DME to home and portable oxygen to room prior to dc.   Expected Discharge Date:  01/03/18               Expected Discharge Plan:  [place]  In-House Referral:  NA  Discharge planning Services  CM Consult  Post Acute Care Choice:  Home Health Choice offered to:  Patient  DME Arranged:  3-N-1, Hospital bed, Shower stool, oxygen  DME Agency:  [place]:  RN, Nurse's Aide, Social Work [employer] Agency:  [place]  Status of Service:  Completed, signed off  If discussed at [employer], dates discussed:    Additional Comments:  Erenest Rasher, RN 01/03/2018, 2:58 PM

## 2018-01-03 NOTE — Discharge Summary (Addendum)
Physician Discharge Summary  Michaela Little XNA:[id number] DOB: [birth year] DOA: 01/01/2018  PCP: Clent Demark, PA-C  Admit date: 01/01/2018 Discharge date: 01/04/2018  Time spent: 65 minutes  Recommendations for Outpatient Follow-up:  1. Follow up with dr Burr Medico around 10/23. Request order for labs to be drawn by [facility] RN 2. 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 female with medical history significant of hypertension,  asthma, breast cancer with metastasis to bone,received oral chemotherapy with last dose toward the end of September sent to the ED on 10/10 by her PCPs office for further evaluation of low hemoglobin on labs.  Patient reported having generalized lightheadedness, weakness, dyspnea on exertion, chest pressure with exertion, and palpitations for the previous 3 weeks.  Also reported noticing blood in her stool 2 weeks prior. Denied having any episodes of syncope.  Stated she had been coughing for  2 weeks and the cough is productive of yellow-colored sputum.  Denied having any fevers.  Stated [name] was stopped last week because her blood counts were dropping.  Stated she is being followed by Dr. Burr Medico (oncology).  Patient reported being a Jehovah's Witness and  stated she does not want a blood transfusion.  Hospital Course:  Symptomatic anemia and pancytopenia. Continues with generalized lightheadedness, weakness, dyspnea on exertion. Denies chest pressure. Hemoglobin flat at 3.9, white count 3.1, and platelets 80,000.  Pancytopenia noted on previous labs as well, likely due to marrow suppression from chemotherapy [name] which was stopped on December 19, 2017). Troponin negative x2. EKG with SR septal infarct age undeterminant. FOBT negative.  ED provider spoke to GI and they are not planning to scope at this time. 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 Evaluated by Dr. Burr Medico who opined neutropenia and thrombocytopenia improving, no need for iron supplement.  recommendation was to give Aranesp recheck in 2 weeks and if no improvement consider bone marrow biopsy to rule out diffuse bone mets.   She received a dose in the ED. -of note, patient and family under impression that [facility] has something to offer. [facility] hospitalist dr Darryl Lent was called 10/11 as follow up to call 2 days ago. She repeated that no blood substitutes.  Acute respiratory failure with hypoxia. Related to above. Oxygen saturation level dropped to low 80's with ambulation. Chest xrya 10/9 without cardiopulmonary process -oxygen supplementation at home arranged   Bronchitis -Cough productive of yellow sputum - she remains afebrile. Leukopenic in the setting of recent chemo.  Chest x-ray without signs of acute infection.  Lungs clear on exam. 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 Burr Medico (oncology).  Please read her previous  note from December 23, 2017. -[name] stopped December 19, 2017  Hypertension. BP low end of normal. Home meds include amlodipine and Coreg -continue home med  GERD -Continue PPI and H2  blocker   Procedures:  none  Consultations:  [facility] onc dr Burr Medico  Discharge Exam: Vitals:   01/04/18 0810 01/04/18 0929  BP:  119/63  Pulse:  78  Resp:  20  Temp:  99.1 F (37.3 C)  SpO2: 99% 99%    General: obese appears chronically ill Cardiovascular: RRR no MGR no LE edema Respiratory: mild increased work of breathing with conversation. BS coarse and distant  Discharge Instructions   Discharge Instructions    Call MD for:  difficulty breathing, headache or visual disturbances   Complete by:  As directed    Call MD for:  persistant dizziness or light-headedness   Complete by:  As directed    Call MD for:  temperature >100.4   Complete by:  As directed    Diet - low sodium heart healthy   Complete by:  As directed    Discharge instructions   Complete by:  As directed    Call Dr Ernestina Penna office to schedule follow up appointment for around 10/23 (2 weeks after injection). Have lab work ordered before appointment so [facility] RN can draw. Call PCP to update and review need for 10/17 appointment.   Increase activity slowly   Complete by:  As directed      Allergies as of 01/04/2018      Reactions   Lisinopril-hydrochlorothiazide Itching   Adhesive [tape] Itching, Other (See Comments)   Redness   Fentanyl Other (See Comments)   GI upset and drowsiness   Gabapentin Other (See Comments)   Auditory hallucinations   Hctz [hydrochlorothiazide] Itching   Latex Itching   Lisinopril Itching   Losartan Potassium Other (See Comments)   Makes her feel "bad"   Other Other (See Comments)   Patient refuses blood for religious reasons (per her notes)   Oxycodone    GI upset, drowsy   Tums [calcium Carbonate Antacid] Hives   FRUIT-FLAVORED ONES      Medication List    STOP taking these medications   benzonatate 100 MG capsule Commonly known as:  TESSALON   IBRANCE 75 MG capsule Generic drug:  palbociclib     TAKE these medications   amLODipine 10 MG tablet Commonly  known as:  NORVASC Take 1 tablet (10 mg total) by mouth daily.   carvedilol 6.25 MG tablet Commonly known as:  COREG Take 1 tablet (6.25 mg total) by mouth 2 (two) times daily with a meal.   cetirizine 10 MG tablet Commonly known as:  ZYRTEC Take 10 mg by mouth daily.   diphenhydrAMINE 25 mg capsule Commonly known as:  BENADRYL Take 1 capsule (25 mg total) by mouth every 6 (six) hours as needed for itching.   exemestane 25 MG tablet Commonly known as:  AROMASIN Take 1 tablet (25 mg total) by mouth daily after breakfast.   fluticasone furoate-vilanterol 100-25 MCG/INH Aepb Commonly known as:  BREO ELLIPTA Inhale 1 puff into the lungs daily.   furosemide 20 MG tablet Commonly known as:  LASIX Take 1 tablet (20 mg total) by mouth daily as needed (for fluid retention.).   ipratropium-albuterol 0.5-2.5 (3) MG/3ML Soln Commonly known as:  DUONEB Take 3 mLs by nebulization every 6 (six) hours as needed. What changed:  reasons to take this   montelukast 10 MG tablet Commonly known  as:  SINGULAIR Take 1 tablet (10 mg total) by mouth at bedtime.   naproxen 500 MG tablet Commonly known as:  NAPROSYN Take 1 tablet (500 mg total) by mouth 2 (two) times daily with a meal. What changed:    when to take this  reasons to take this   pantoprazole 40 MG tablet Commonly known as:  PROTONIX Take 1 tablet (40 mg total) by mouth daily. Take 30-60 min before first meal of the day   promethazine 25 MG tablet Commonly known as:  PHENERGAN Take 1 tablet (25 mg total) by mouth every 6 (six) hours as needed for nausea or vomiting.   ranitidine 300 MG tablet Commonly known as:  ZANTAC Take 1 tablet (300 mg total) by mouth at bedtime. What changed:    when to take this  reasons to take this   traMADol 50 MG tablet Commonly known as:  ULTRAM Take 1-2 tablets (50-100 mg total) by mouth every 6 (six) hours as needed for moderate pain or severe pain.            Durable Medical  Equipment  (From admission, onward)         Start     Ordered   01/03/18 1440  For home use only DME oxygen  Once    Comments:  Patient has severe anemia and due to religious beliefs, will not take blood products, needs O2 to help with oxygenation  Question Answer Comment  Mode or (Route) Nasal cannula   Liters per Minute 2   Frequency Continuous (stationary and portable oxygen unit needed)   Oxygen delivery system Gas      01/03/18 1440   01/03/18 1421  For home use only DME 3 n 1  Once     01/03/18 1420   01/03/18 1421  For home use only DME Shower stool  Once     01/03/18 1420   01/03/18 1418  For home use only DME Hospital bed  Once    Comments:  increased weakness due to severe anemia, pain in chest from breast cancer which requires patient to change position to assist with pain  Question Answer Comment  Patient has (list medical condition): Pancytopenia, weakness, breast cancer   The above medical condition requires: Patient requires the ability to reposition frequently   Head must be elevated greater than: Other see comments   Bed type Semi-electric   Trapeze Bar Yes      01/03/18 1419         Allergies  Allergen Reactions  . Lisinopril-Hydrochlorothiazide Itching  . Adhesive [Tape] Itching and Other (See Comments)    Redness  . Fentanyl Other (See Comments)    GI upset and drowsiness  . Gabapentin Other (See Comments)    Auditory hallucinations  . Hctz [Hydrochlorothiazide] Itching  . Latex Itching  . Lisinopril Itching  . Losartan Potassium Other (See Comments)    Makes her feel "bad"   . Other Other (See Comments)    Patient refuses blood for religious reasons (per her notes)  . Oxycodone     GI upset, drowsy  . Tums [Calcium Carbonate Antacid] Hives    FRUIT-FLAVORED ONES   Follow-up Information    [facility].   Specialty:  Home Health Services Why:  Home Health RN, aide, Social Worker agency will call to arrange  initial visit Contact information: [address] [place] [address] [phone]  The results of significant diagnostics from this hospitalization (including imaging, microbiology, ancillary and laboratory) are listed below for reference.    Significant Diagnostic Studies: Dg Chest 2 View  Result Date: 12/23/2017 CLINICAL DATA:  Anemia.  Tachycardia.  Cough. EXAM: CHEST - 2 VIEW COMPARISON:  October 18, 2017 FINDINGS: There is no edema or consolidation. Heart size and pulmonary vascularity are normal. No adenopathy. Patient is status post left mastectomy with surgical clips in left axillary region. No blastic or lytic bone lesions evident. IMPRESSION: Status post left mastectomy. No edema or consolidation. No evident adenopathy. Electronically Signed   By: Lowella Grip III M.D.   On: 12/23/2017 11:50   Dg Chest Port 1 View  Result Date: 01/01/2018 CLINICAL DATA:  Low blood count.  History of breast cancer. EXAM: PORTABLE CHEST 1 VIEW COMPARISON:  December 23, 2017 FINDINGS: The heart size and mediastinal contours are stable. The heart size is enlarged. Both lungs are clear. Patient status post prior left mastectomy with left axillary dissection. Degenerative joint changes of the spine are noted. IMPRESSION: No active cardiopulmonary disease. Electronically Signed   By: Abelardo Diesel M.D.   On: 01/01/2018 18:40    Microbiology: No results found for this or any previous visit (from the past 240 hour(s)).   Labs: Basic Metabolic Panel: Recent Labs  Lab 01/01/18 1637  NA 137  K 3.5  CL 109  CO2 21*  GLUCOSE 133*  BUN 6  CREATININE 0.68  CALCIUM 8.9   Liver Function Tests: No results for input(s): AST, ALT, ALKPHOS, BILITOT, PROT, ALBUMIN in the last 168 hours. No results for input(s): LIPASE, AMYLASE in the last 168 hours. No results for input(s): AMMONIA in the last 168 hours. CBC: Recent Labs  Lab 12/31/17 1154 01/01/18 1637 01/02/18 0451  01/03/18 1024  WBC 2.5* 2.4* 2.6* 3.1*  NEUTROABS 1.0* 1.0*  --   --   HGB 4.2* 4.1* 3.9* 3.9*  HCT 13.1* 13.5* 13.1* 12.7*  MCV 87 91.2 91.6 92.0  PLT 108* 84* 93* 80*   Cardiac Enzymes: Recent Labs  Lab 01/01/18 1838  TROPONINI <0.03   BNP: BNP (last 3 results) Recent Labs    08/27/17 1927 09/25/17 1420 01/01/18 1838  BNP 29.8 20.4 25.8    ProBNP (last 3 results) No results for input(s): PROBNP in the last 8760 hours.  CBG: No results for input(s): GLUCAP in the last 168 hours.     Signed:  Eulogio Bear DO [facility] 01/04/2018, 10:31 AM

## 2018-01-03 NOTE — Progress Notes (Signed)
[facility] PROGRESS NOTE  Michaela Little MMN:[id number] DOB: [birth year] DOA: 01/01/2018 PCP: Michaela Demark, PA-C  Assessment/Plan: Symptomatic anemia and pancytopenia. Continues with generalized lightheadedness, weakness, dyspnea on exertion. Denies chest pressure. Hemoglobin flat at 3.9,white count 3.1, and platelets 80,000. Pancytopenia noted on previous labs as well, likely due to marrow suppression from chemotherapy Michaela Little was stopped on December 19, 2017). Troponin negative x2. EKG with SR septal infarct age undeterminant. FOBT negative.ED provider spoke to GI and they are not planning to scope at this time. 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 Evaluated by Dr. Burr Little who opined neutropenia and thrombocytopenia improving, no need for iron supplement.  recommendation was to giveAranesp recheck in 2 weeks and if no improvement consider bone marrow biopsy to rule out diffuse bone mets. She received a dose in the ED. -Palliative care consult -of note, patient and family under impression that Michaela Little has something to offer. Michaela Little hospitalist dr Michaela Little called today as follow up to call 2 days ago. She repeated that no blood substitutes. -ready for discharge but no safe discharge plan. -follow up with HH  Bronchitis -Cough productive of yellow sputum - she remains afebrile.Leukopenic in the setting of recent chemo.Chest x-ray without signs of acute infection. Lungs clear on exam. 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 Burr Little (oncology).Please read her previous note from December 23, 2017. -Michaela Little stopped December 19, 2017  Hypertension. BP low end of normal. Little meds include amlodipine and Coreg -continue Little med  GERD -Continue PPI and H2 blocker    Code Status: full Family Communication: daughter and  sister Disposition Plan: Little vs placement   Consultants:  Michaela Little  Procedures:    Antibiotics:    HPI/Subjective: Sitting up in bed watching tv. Somewhat lethargic.   Michaela Little a 57 y.o.femalewith medical history significant ofhypertension,  asthma, breast cancer with metastasis to bone,received oral chemotherapy with last dose toward the end of September sent to the ED on 10/10 by her PCPs office for further evaluation of low hemoglobin on labs.Patient reported having generalized lightheadedness, weakness, dyspnea on exertion, chest pressure with exertion, and palpitations for the previous 3 weeks. Also reported noticing blood in her stool 2 weeks prior.Denied having any episodes of syncope. Stated she had been coughing for  2 weeks and the cough is productive of yellow-colored sputum. Denied having any fevers. Stated Michaela Little was stopped last week because her blood counts were dropping. Stated she is being followed by Dr. Feng(oncology). Patient reported being a Jehovah's Witness and stated she does not want ablood transfusion  Objective: Vitals:   01/03/18 1455 01/03/18 1517  BP:    Pulse: (!) 113 (!) 103  Resp:    Temp:    SpO2: (!) 89% 100%    Intake/Output Summary (Last 24 hours) at 01/03/2018 1623 Last data filed at 01/03/2018 1300 Gross per 24 hour  Intake 1200 ml  Output 0 ml  Net 1200 ml   Filed Weights   01/01/18 1628  Weight: 117.9 kg    Exam:   General:  Obese awake slightly lethargic in no acute distress  Cardiovascular: RRR no mgr no LE edema  Respiratory: normal effort but shallow BS coarse no crackles  Abdomen: obese +BS no guarding or rebounding  Musculoskeletal: joints without swelling/erythema   Data Reviewed: Basic Metabolic Panel: Recent Labs  Lab 01/01/18 1637  NA 137  K 3.5  CL 109  CO2 21*  GLUCOSE 133*  BUN 6  CREATININE 0.68  CALCIUM 8.9   Liver Function Tests: No results for input(s): AST,  ALT, ALKPHOS, BILITOT, PROT, ALBUMIN in the last 168 hours. No results for input(s): LIPASE, AMYLASE in the last 168 hours. No results for input(s): AMMONIA in the last 168 hours. CBC: Recent Labs  Lab 12/31/17 1154 01/01/18 1637 01/02/18 0451 01/03/18 1024  WBC 2.5* 2.4* 2.6* 3.1*  NEUTROABS 1.0* 1.0*  --   --   HGB 4.2* 4.1* 3.9* 3.9*  HCT 13.1* 13.5* 13.1* 12.7*  MCV 87 91.2 91.6 92.0  PLT 108* 84* 93* 80*   Cardiac Enzymes: Recent Labs  Lab 01/01/18 1838  TROPONINI <0.03   BNP (last 3 results) Recent Labs    08/27/17 1927 09/25/17 1420 01/01/18 1838  BNP 29.8 20.4 25.8    ProBNP (last 3 results) No results for input(s): PROBNP in the last 8760 hours.  CBG: No results for input(s): GLUCAP in the last 168 hours.  No results found for this or any previous visit (from the past 240 hour(s)).   Studies: Dg Chest Port 1 View  Result Date: 01/01/2018 CLINICAL DATA:  Low blood count.  History of breast cancer. EXAM: PORTABLE CHEST 1 VIEW COMPARISON:  December 23, 2017 FINDINGS: The heart size and mediastinal contours are stable. The heart size is enlarged. Both lungs are clear. Patient status post prior left mastectomy with left axillary dissection. Degenerative joint changes of the spine are noted. IMPRESSION: No active cardiopulmonary disease. Electronically Signed   By: Michaela Little M.D.   On: 01/01/2018 18:40    Scheduled Meds: . carvedilol  6.25 mg Oral BID WC  . dextromethorphan-guaiFENesin  1 tablet Oral BID  . fluticasone furoate-vilanterol  1 puff Inhalation Daily  . folic acid  1 mg Oral Daily  . loratadine  10 mg Oral Daily  . montelukast  10 mg Oral QHS  . pantoprazole  40 mg Oral Daily  . 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

## 2018-01-04 DIAGNOSIS — J9601 Acute respiratory failure with hypoxia: Secondary | ICD-10-CM | POA: Diagnosis not present

## 2018-01-04 DIAGNOSIS — D649 Anemia, unspecified: Secondary | ICD-10-CM | POA: Diagnosis not present

## 2018-01-04 MED ORDER — FOLIC ACID 1 MG PO TABS: 1.0000 mg | ORAL_TABLET | Freq: Every day | ORAL | 0 refills | Status: DC

## 2018-01-04 MED ORDER — CYANOCOBALAMIN 1000 MCG PO TABS: 1000.0000 ug | ORAL_TABLET | Freq: Every day | ORAL | 0 refills | Status: DC

## 2018-01-04 MED ORDER — DIPHENHYDRAMINE HCL 25 MG PO CAPS: 25.0000 mg | ORAL_CAPSULE | Freq: Four times a day (QID) | ORAL | 0 refills | Status: DC | PRN

## 2018-01-04 NOTE — Progress Notes (Signed)
Ouita Mart Piggs to be D/C'd Home per MD order.  Discussed prescriptions and follow up appointments with the patient. Prescriptions given to patient, medication list explained in detail. Pt verbalized understanding.  Allergies as of 01/04/2018      Reactions   Lisinopril-hydrochlorothiazide Itching   Adhesive [tape] Itching, Other (See Comments)   Redness   Fentanyl Other (See Comments)   GI upset and drowsiness   Gabapentin Other (See Comments)   Auditory hallucinations   Hctz [hydrochlorothiazide] Itching   Latex Itching   Lisinopril Itching   Losartan Potassium Other (See Comments)   Makes her feel "bad"   Other Other (See Comments)   Patient refuses blood for religious reasons (per her notes)   Oxycodone    GI upset, drowsy   Tums [calcium Carbonate Antacid] Hives   FRUIT-FLAVORED ONES      Medication List    STOP taking these medications   amLODipine 10 MG tablet Commonly known as:  NORVASC   benzonatate 100 MG capsule Commonly known as:  TESSALON   IBRANCE 75 MG capsule Generic drug:  palbociclib   naproxen 500 MG tablet Commonly known as:  NAPROSYN     TAKE these medications   carvedilol 6.25 MG tablet Commonly known as:  COREG Take 1 tablet (6.25 mg total) by mouth 2 (two) times daily with a meal.   cetirizine 10 MG tablet Commonly known as:  ZYRTEC Take 10 mg by mouth daily.   cyanocobalamin 1000 MCG tablet Take 1 tablet (1,000 mcg total) by mouth daily. Start taking on:  01/05/2018   diphenhydrAMINE 25 mg capsule Commonly known as:  BENADRYL Take 1 capsule (25 mg total) by mouth every 6 (six) hours as needed for itching.   exemestane 25 MG tablet Commonly known as:  AROMASIN Take 1 tablet (25 mg total) by mouth daily after breakfast.   fluticasone furoate-vilanterol 100-25 MCG/INH Aepb Commonly known as:  BREO ELLIPTA Inhale 1 puff into the lungs daily.   folic acid 1 MG tablet Commonly known as:  FOLVITE Take 1 tablet (1 mg total) by mouth  daily. Start taking on:  01/05/2018   furosemide 20 MG tablet Commonly known as:  LASIX Take 1 tablet (20 mg total) by mouth daily as needed (for fluid retention.).   ipratropium-albuterol 0.5-2.5 (3) MG/3ML Soln Commonly known as:  DUONEB Take 3 mLs by nebulization every 6 (six) hours as needed. What changed:  reasons to take this   montelukast 10 MG tablet Commonly known as:  SINGULAIR Take 1 tablet (10 mg total) by mouth at bedtime.   pantoprazole 40 MG tablet Commonly known as:  PROTONIX Take 1 tablet (40 mg total) by mouth daily. Take 30-60 min before first meal of the day   promethazine 25 MG tablet Commonly known as:  PHENERGAN Take 1 tablet (25 mg total) by mouth every 6 (six) hours as needed for nausea or vomiting.   ranitidine 300 MG tablet Commonly known as:  ZANTAC Take 1 tablet (300 mg total) by mouth at bedtime. What changed:    when to take this  reasons to take this   traMADol 50 MG tablet Commonly known as:  ULTRAM Take 1-2 tablets (50-100 mg total) by mouth every 6 (six) hours as needed for moderate pain or severe pain.            Durable Medical Equipment  (From admission, onward)         Start     Ordered  01/03/18 1440  For home use only DME oxygen  Once    Comments:  Patient has severe anemia and due to religious beliefs, will not take blood products, needs O2 to help with oxygenation  Question Answer Comment  Mode or (Route) Nasal cannula   Liters per Minute 2   Frequency Continuous (stationary and portable oxygen unit needed)   Oxygen delivery system Gas      01/03/18 1440   01/03/18 1421  For home use only DME 3 n 1  Once     01/03/18 1420   01/03/18 1421  For home use only DME Shower stool  Once     01/03/18 1420   01/03/18 1418  For home use only DME Hospital bed  Once    Comments:  increased weakness due to severe anemia, pain in chest from breast cancer which requires patient to change position to assist with pain  Question  Answer Comment  Patient has (list medical condition): Pancytopenia, weakness, breast cancer   The above medical condition requires: Patient requires the ability to reposition frequently   Head must be elevated greater than: Other see comments   Bed type Semi-electric   Trapeze Bar Yes      01/03/18 1419          Vitals:   01/04/18 0810 01/04/18 0929  BP:  119/63  Pulse:  78  Resp:  20  Temp:  99.1 F (37.3 C)  SpO2: 99% 99%    Skin clean, dry and intact without evidence of skin break down, no evidence of skin tears noted. IV catheter discontinued intact. Site without signs and symptoms of complications. Dressing and pressure applied. Pt denies pain at this time. No complaints noted.  An After Visit Summary was printed and given to the patient. Patient escorted via [place], and D/C home via private auto.  Aneta Mins RN, BSN

## 2018-01-06 ENCOUNTER — Telehealth: Payer: Self-pay | Admitting: Emergency Medicine

## 2018-01-06 ENCOUNTER — Telehealth: Payer: Self-pay

## 2018-01-06 ENCOUNTER — Inpatient Hospital Stay (HOSPITAL_BASED_OUTPATIENT_CLINIC_OR_DEPARTMENT_OTHER): Payer: Medicare Other | Admitting: Nurse Practitioner

## 2018-01-06 ENCOUNTER — Inpatient Hospital Stay: Payer: Medicare Other | Attending: Hematology

## 2018-01-06 ENCOUNTER — Inpatient Hospital Stay: Payer: Medicare Other

## 2018-01-06 ENCOUNTER — Encounter: Payer: Self-pay | Admitting: Nurse Practitioner

## 2018-01-06 ENCOUNTER — Other Ambulatory Visit: Payer: Self-pay | Admitting: Emergency Medicine

## 2018-01-06 VITALS — BP 150/69 | HR 91 | Temp 98.6°F | Resp 15 | Ht 67.0 in | Wt 254.8 lb

## 2018-01-06 DIAGNOSIS — Z9981 Dependence on supplemental oxygen: Secondary | ICD-10-CM | POA: Insufficient documentation

## 2018-01-06 DIAGNOSIS — D61818 Other pancytopenia: Secondary | ICD-10-CM | POA: Diagnosis not present

## 2018-01-06 DIAGNOSIS — D696 Thrombocytopenia, unspecified: Secondary | ICD-10-CM | POA: Diagnosis not present

## 2018-01-06 DIAGNOSIS — D6481 Anemia due to antineoplastic chemotherapy: Secondary | ICD-10-CM | POA: Insufficient documentation

## 2018-01-06 DIAGNOSIS — C785 Secondary malignant neoplasm of large intestine and rectum: Secondary | ICD-10-CM | POA: Insufficient documentation

## 2018-01-06 DIAGNOSIS — C50912 Malignant neoplasm of unspecified site of left female breast: Secondary | ICD-10-CM

## 2018-01-06 DIAGNOSIS — D649 Anemia, unspecified: Secondary | ICD-10-CM

## 2018-01-06 DIAGNOSIS — C50812 Malignant neoplasm of overlapping sites of left female breast: Secondary | ICD-10-CM | POA: Diagnosis not present

## 2018-01-06 DIAGNOSIS — D709 Neutropenia, unspecified: Secondary | ICD-10-CM | POA: Insufficient documentation

## 2018-01-06 DIAGNOSIS — R1110 Vomiting, unspecified: Secondary | ICD-10-CM

## 2018-01-06 DIAGNOSIS — C7951 Secondary malignant neoplasm of bone: Secondary | ICD-10-CM

## 2018-01-06 DIAGNOSIS — Z79899 Other long term (current) drug therapy: Secondary | ICD-10-CM | POA: Diagnosis not present

## 2018-01-06 DIAGNOSIS — R05 Cough: Secondary | ICD-10-CM

## 2018-01-06 DIAGNOSIS — R5383 Other fatigue: Secondary | ICD-10-CM

## 2018-01-06 DIAGNOSIS — R0609 Other forms of dyspnea: Secondary | ICD-10-CM

## 2018-01-06 LAB — COMPREHENSIVE METABOLIC PANEL
ALBUMIN: 4 g/dL (ref 3.5–5.0)
ALT: 17 U/L (ref 0–44)
AST: 38 U/L (ref 15–41)
Alkaline Phosphatase: 132 U/L — ABNORMAL HIGH (ref 38–126)
Anion gap: 9 (ref 5–15)
BILIRUBIN TOTAL: 2.3 mg/dL — AB (ref 0.3–1.2)
BUN: 13 mg/dL (ref 6–20)
CO2: 24 mmol/L (ref 22–32)
CREATININE: 0.71 mg/dL (ref 0.44–1.00)
Calcium: 8.8 mg/dL — ABNORMAL LOW (ref 8.9–10.3)
Chloride: 108 mmol/L (ref 98–111)
GFR calc Af Amer: >60 mL/min (ref >60)
GLUCOSE: 157 mg/dL — AB (ref 70–99)
Potassium: 3.7 mmol/L (ref 3.5–5.1)
Sodium: 141 mmol/L (ref 135–145)
TOTAL PROTEIN: 7.6 g/dL (ref 6.5–8.1)

## 2018-01-06 LAB — CBC WITH DIFFERENTIAL/PLATELET
BAND NEUTROPHILS: 3 %
BASOS PCT: 1 %
Basophils Absolute: 0 10*3/uL (ref 0.0–0.1)
EOS ABS: 0.1 10*3/uL (ref 0.0–0.5)
Eosinophils Relative: 2 %
HCT: 13.8 % — ABNORMAL LOW (ref 36.0–46.0)
Hemoglobin: 4.1 g/dL — CL (ref 12.0–15.0)
LYMPHS ABS: 1.8 10*3/uL (ref 0.7–4.0)
Lymphocytes Relative: 49 %
MCH: 27.9 pg (ref 26.0–34.0)
MCHC: 29.7 g/dL — ABNORMAL LOW (ref 30.0–36.0)
MCV: 93.9 fL (ref 80.0–100.0)
METAMYELOCYTES PCT: 2 %
MONO ABS: 0 10*3/uL — AB (ref 0.1–1.0)
MONOS PCT: 1 %
MYELOCYTES: 2 %
Neutro Abs: 1.7 10*3/uL (ref 1.7–17.7)
Neutrophils Relative %: 40 %
PLATELETS: 95 10*3/uL — AB (ref 150–400)
RBC Morphology: 33
RBC: 1.47 MIL/uL — ABNORMAL LOW (ref 3.87–5.11)
RDW: 24.1 % — AB (ref 11.5–15.5)
WBC: 3.6 10*3/uL — AB (ref 4.0–10.5)
nRBC: 0.8 % — ABNORMAL HIGH (ref 0.0–0.2)

## 2018-01-06 MED ORDER — DENOSUMAB 120 MG/1.7ML ~~LOC~~ SOLN
SUBCUTANEOUS | Status: AC
  Filled 2018-01-06: qty 1.7

## 2018-01-06 NOTE — Telephone Encounter (Addendum)
Spoke to [facility]. They do no do in home ADL care. I explained this to the patients daughter and told her that our social workers would be reaching out with potential other resources. Explained to her that must in home care for ADLs is through private sitter company's. She verbalized understanding. Also ordered the wheelchair with [facility].  ----- Message from Alla Feeling, NP sent at 01/06/2018  2:05 PM EDT ----- Can you please call advanced home [facility] and inquire about what is the maximum assistance this patient is qualified for? She needs frequent care, Hgb 4, on O2, very low activity tolerance. If she does not yet have the maximum, please request that for her, as well as look into if she can get wheelchair at home.  Thanks, Regan Rakers

## 2018-01-06 NOTE — Progress Notes (Addendum)
[place]  [phone] Fax:([phone]) [phone]  Clinic Follow up Note   Patient Care Team: [name] as PCP - General (Physician Assistant) [name], MD as Consulting Physician (Pulmonary Disease) [name], MD as Consulting Physician (Hematology) 01/06/2018  SUMMARY OF ONCOLOGIC HISTORY: Oncology History   Cancer Staging Malignant neoplasm of overlapping sites of left breast in female, estrogen receptor positive ([place]) Staging form: Breast, AJCC 7th Edition - Clinical: Stage IIIA (T2, N2, M0) - Unsigned       Malignant neoplasm of overlapping sites of left breast in female, estrogen receptor positive ([place])   05/2008 Cancer Diagnosis    Left sided breast cancer (no path report available)    11/2008 Pathologic Stage    Stage IIIA: T2 N2     Neo-Adjuvant Chemotherapy    Paclitaxel weekly x 12; doxorubicin and cyclophosphamide x 4 - both given with trifiparnib (treated at [facility] in [place])    11/2008 Definitive Surgery    Left modified radiation mastectomy: invasive lobular carcinoma, ER+, PR+, HER2/neu negative, 5/22 LN positive     - 03/2009 Radiation Therapy    Adjuvant radiation to left chest wall    2011 - 08/2014 Anti-estrogen oral therapy    Tamoxifen 20 mg used briefly; discontinued due to uterine lining concerns; changed to anastrozole 1 mg daily (began 07/2100); discontinued by patient summer of 2016    05/26/2015 Progression    Iliac bone biopsy showed metastatic carcinoma consistent with a breast primary, ER positive, PR and HER-2 negative. Her staging CT, and a PET scan was negative for visceral metastasis.    05/2015 -  Anti-estrogen oral therapy    1. letrozole and Ibrance started March 2017             -Ibrance dose decreased to 75 mg daily  -Letrozole switched to Exemestane on 02/13/17 due to b/l rib pain.     11/16/2015 Miscellaneous    Xgeva monthly for bone metastasis     07/03/2016 Imaging    CT chest,  abdomen and pelvis with contrast showed no evidence of metastasis.     02/07/2017 Imaging    CT AP W Contrast 02/07/17 IMPRESSION: 1. No CT findings for abdominal/pelvic metastatic disease. 2. No acute abdominal findings, mass lesions or adenopathy. 3. Status post cholecystectomy with mild associated common bile duct dilatation. 4. Somewhat thickened appendix appears relatively stable. No acute inflammatory process. 5. Stable mixed lytic and sclerotic osseous metastatic disease.     02/07/2017 Imaging    Bone Scan Whole Body 02/07/17 IMPRESSION: No scintigraphic evidence skeletal metastasis     08/12/2017 Imaging    Whole [place]oday Scan 08/12/17 IMPRESSION: Scattered degenerative type uptake as above with a questionable focus of increased tracer localization at L2 vertebral body versus artifact; no abnormality is seen at this site by CT. Consider either characterization by MR or attention on follow-up imaging.    08/12/2017 Imaging    CT AP W Contrast 08/12/17  IMPRESSION: Continued thickening of the appendix is noted without surrounding inflammation. It has maximum measured diameter is decreased compared to prior exam. There is no evidence of acute inflammation. Stable mixed lytic and sclerotic appearance of visualized skeleton is noted consistent with history of known osseous metastases. No acute abnormality seen in the abdomen or pelvis.    08/22/2017 Surgery    APPENDECTOMY LAPAROSCOPIC ERAS PATHWAY by Dr. Malcolm Metro and REMOVAL OF LEFT TISSUE EXPANDER by Dr. Harlow Mares 08/22/17    08/22/2017 Pathology  Results    Diagnosis 08/22/17  1. Appendix, Other than Incidental - METASTATIC CARCINOMA, CONSISTENT WITH BREAST PRIMARY. - CARCINOMA IS PRESENT AT THE SURGICAL RESECTION MARGIN AS WELL AS THE SEROSAL SURFACE. - LYMPHOVASCULAR INVASION IS IDENTIFIED. - SEE COMMENT. 2. Breast, capsule, Left - DENSE FIBROUS TISSUE WITH MILD INFLAMMATION, INCLUDING A FEW SCATTERED MULTINUCLEATED  GIANT CELLS. - THERE IS NO EVIDENCE OF MALIGNANCY.    08/27/2017 Imaging    CT AP W Contrast 08/27/17 IMPRESSION: No evidence of pulmonary embolus. Small pericardial effusion with uncertain clinical significance. No evidence of acute abnormalities within the chest, abdomen or pelvis. Area of fat stranding and small focus of gas within the anterior abdominal wall, immediately inferior to the umbilicus, probably at the site of laparoscopic access.    10/07/2017 Echocardiogram    10/07/2017 ECO Study Conclusions  - Procedure narrative: Transthoracic echocardiography. Image   quality was adequate. The study was technically difficult, as a   result of poor acoustic windows. - Left ventricle: The cavity size was normal. Wall thickness was   normal. Systolic function was normal. The estimated ejection   fraction was in the range of 60% to 65%. Wall motion was normal;   there were no regional wall motion abnormalities. Features are   consistent with a pseudonormal left ventricular filling pattern,   with concomitant abnormal relaxation and increased filling   pressure (grade 2 diastolic dysfunction).   CURRENT TREATMENT:   1. letrozole and Ibrancestarted March 2017 -Ibrance dose decreased to 75 mg daily, held on 06/03/2017 due to surgery, restarted around 09/19/17, stopped on 12/19/2017 due to severe pancytopenia    -Letrozole switched to Exemestane on 02/13/17 due to b/l rib pain.  2. Xgevastarted 11/16/2015, repeated monthly   INTERVAL HISTORY: Ms. Swoboda returns with her daughters for follow up. [name] has been on hold since 9/26. She was seen in clinic on 9/30 by Dr. Burr Medico and received aranesp 300 mcg. She went to ED later that day for cough and tachycardia. She was sent back to ED on 10/9 after CBC at PCP revealed persistent anemia. She was admitted, Hgb 3.9. FOBT was negative; work up not suggestive of hemolysis. No clear role for iron replacement. She received aranesp on 9/30 and  B12 injection on 10/10 during hospitalization. She repeatedly declined blood transfusion, she is Jehovah's Witness. [facility] was consulted, no blood substitutes to offer. Today she presents in wheelchair with supplemental oxygen at 2.5 liters. She reports extreme fatigue and dyspnea on exertion. Has palpitations and can hear "heart pounding in my ears" when she exerts in any way. She has headache. She has cough with occasional yellow sputum, cough is usually brought on by speaking. She has vomiting intermittently after cough without nausea. She is eating some and drinking. Denies fever, chills, chest pain. Daughter has been staying with her.    MEDICAL HISTORY:  Past Medical History:  Diagnosis Date  . Anxiety   . Arthritis    hands, knees, hips  . Asthma   . Bronchitis   . Cancer ([place])   . Cervical stenosis (uterine cervix)   . Disorder of appendix    Enlarged  . Dyspnea on exertion   . GERD (gastroesophageal reflux disease)   . Headache(784.0)   . History of breast cancer    2010--  LEFT  s/p  mastectomy (in [place]) AND CHEMORADIATION--  NO RECURRENCE  . History of cervical dysplasia   . Hyperlipidemia   . Hypertension   . Malignant neoplasm of overlapping  sites of left breast in female, estrogen receptor positive ([place]) 03/05/2013  . OSA (obstructive sleep apnea) moderate osa per study  09/2010   CPAP  , NOT USING ON REGULAR BASIS  . Pelvic pain in female   . Positive H. pylori test    08-05-2013  . Positive TB test    AS TEEN--  [place] W/ MEDS  . Refusal of blood transfusions as patient is Jehovah's Witness   . Seasonal allergies   . Uterine fibroid   . Wears glasses     SURGICAL HISTORY: Past Surgical History:  Procedure Laterality Date  . APPENDECTOMY  y  . CARDIOVASCULAR STRESS TEST  10-29-2012   low risk perfusion study/  no significant reversibity/ ef 66%/  normal wall motion  . CERVICAL CONIZATION W/BX  2012   in  [place] N/A 05/14/2012   Procedure: LAPAROSCOPIC  CHOLECYSTECTOMY;  Surgeon: Ralene Ok, MD;  Location: [facility];  Service: General;  Laterality: N/A;  . COLONOSCOPY  2011   normal per patient - [place]  . DILATION AND CURETTAGE OF UTERUS  10/15/2011   Procedure: DILATATION AND CURETTAGE;  Surgeon: Melina Schools, MD;  Location: [facility];  Service: Gynecology;  Laterality: N/A;  Conization &  endocervical curettings  . EXAMINATION UNDER ANESTHESIA N/A 08/20/2013   Procedure: EXAM UNDER ANESTHESIA;  Surgeon: Margarette Asal, MD;  Location: [facility];  Service: Gynecology;  Laterality: N/A;  . [place]   right  . LAPAROSCOPIC APPENDECTOMY N/A 08/22/2017   Procedure: [place];  Surgeon: Coralie Keens, MD;  Location: [facility];  Service: General;  Laterality: N/A;  . LAPAROSCOPIC ASSISTED VAGINAL HYSTERECTOMY N/A 10/26/2014   Procedure: HYSTERECTOMY ABDOMINAL ;  Surgeon: Molli Posey, MD;  Location: [facility];  Service: Gynecology;  Laterality: N/A;  . MASTECTOMY Left 11/2008  in [place]   . SALPINGOOPHORECTOMY Bilateral 10/26/2014   Procedure: BILATERAL SALPINGO OOPHORECTOMY;  Surgeon: Molli Posey, MD;  Location: [facility];  Service: Gynecology;  Laterality: Bilateral;  . TISSUE EXPANDER PLACEMENT Left 08/22/2017   Procedure: REMOVAL OF LEFT TISSUE EXPANDER;  Surgeon: Crissie Reese, MD;  Location: [facility];  Service: Plastics;  Laterality: Left;  . TISSUE EXPANDER REMOVAL Left 08/22/2017  . TRANSTHORACIC ECHOCARDIOGRAM  10-29-2012   mild lvh/  ef 60-65%/  grade II diastolic dysfunction/  trivial mr  &  tr    I have reviewed the social history and family history with the patient and they are unchanged from previous note.  ALLERGIES:  is allergic to lisinopril-hydrochlorothiazide; adhesive [tape]; fentanyl; gabapentin; hctz [hydrochlorothiazide]; latex; lisinopril; losartan potassium; other; oxycodone; and tums [calcium carbonate antacid].  MEDICATIONS:  Current Outpatient  Medications  Medication Sig Dispense Refill  . carvedilol (COREG) 6.25 MG tablet Take 1 tablet (6.25 mg total) by mouth 2 (two) times daily with a meal. 180 tablet 3  . cetirizine (ZYRTEC) 10 MG tablet Take 10 mg by mouth daily.  11  . diphenhydrAMINE (BENADRYL) 25 mg capsule Take 1 capsule (25 mg total) by mouth every 6 (six) hours as needed for itching. 30 capsule 0  . exemestane (AROMASIN) 25 MG tablet Take 1 tablet (25 mg total) by mouth daily after breakfast. 90 tablet 1  . fluticasone furoate-vilanterol (BREO ELLIPTA) 100-25 MCG/INH AEPB Inhale 1 puff into the lungs daily. 28 each 5  . folic acid (FOLVITE) 1 MG tablet Take 1 tablet (1 mg total) by mouth daily. [place]  tablet 0  . furosemide (LASIX) 20 MG tablet Take 1 tablet (20 mg total) by mouth daily as needed (for fluid retention.). 30 tablet 5  . ipratropium-albuterol (DUONEB) 0.5-2.5 (3) MG/3ML SOLN Take 3 mLs by nebulization every 6 (six) hours as needed. (Patient taking differently: Take 3 mLs by nebulization every 6 (six) hours as needed (wheezing/shortness of breath). ) 360 mL 5  . montelukast (SINGULAIR) 10 MG tablet Take 1 tablet (10 mg total) by mouth at bedtime. 30 tablet 5  . pantoprazole (PROTONIX) 40 MG tablet Take 1 tablet (40 mg total) by mouth daily. Take 30-60 min before first meal of the day 30 tablet 2  . promethazine (PHENERGAN) 25 MG tablet Take 1 tablet (25 mg total) by mouth every 6 (six) hours as needed for nausea or vomiting. 30 tablet 0  . ranitidine (ZANTAC) 300 MG tablet Take 1 tablet (300 mg total) by mouth at bedtime. (Patient taking differently: Take 300 mg by mouth at bedtime as needed (for indigestion.). ) 30 tablet 5  . traMADol (ULTRAM) 50 MG tablet Take 1-2 tablets (50-100 mg total) by mouth every 6 (six) hours as needed for moderate pain or severe pain. 30 tablet 0  . vitamin B-12 1000 MCG tablet Take 1 tablet (1,000 mcg total) by mouth daily. 30 tablet 0   No current facility-administered medications for  this visit.     PHYSICAL EXAMINATION: ECOG PERFORMANCE STATUS: 3 - Symptomatic, >50% confined to bed  Vitals:   01/06/18 1206  BP: (!) 150/69  Pulse: 91  Resp: 15  Temp: 98.6 F (37 C)  SpO2: 100%   Filed Weights   01/06/18 1206  Weight: 254 lb 12.8 oz (115.6 kg)    GENERAL:alert, no distress and comfortable SKIN:  no rashes or significant lesions EYES: sclera clear OROPHARYNX: pale mucus membranes; no thrush or ulcers LYMPH:  no palpable cervical lymphadenopathy  LUNGS: clear to auscultation, with normal breathing effort on 2.5 liters [place]  HEART: regular rate & rhythm, no lower extremity edema ABDOMEN:abdomen soft, non-tender and normal bowel sounds Musculoskeletal:no cyanosis of digits and no clubbing  NEURO: alert & oriented x 3 with fluent speech. Presents in wheelchair  Breast exam deferred    LABORATORY DATA:  I have reviewed the data as listed CBC Latest Ref Rng & Units 01/06/2018 01/03/2018 01/02/2018  WBC 4.0 - 10.5 K/uL 3.6(L) 3.1(L) 2.6(L)  Hemoglobin 12.0 - 15.0 g/dL 4.1(LL) 3.9(LL) 3.9(LL)  Hematocrit 36.0 - 46.0 % 13.8(L) 12.7(L) 13.1(L)  Platelets 150 - 400 K/uL 95(L) 80(L) 93(L)     CMP Latest Ref Rng & Units 01/06/2018 01/01/2018 12/23/2017  Glucose 70 - 99 mg/dL 157(H) 133(H) 116(H)  BUN 6 - 20 mg/dL _0 Creatinine 0.44 - 1.00 mg/dL 0.71 0.68 0.82  Sodium 135 - 145 mmol/L 141 137 137  Potassium 3.5 - 5.1 mmol/L 3.7 3.5 4.0  Chloride 98 - 111 mmol/L 108 109 105  CO2 22 - 32 mmol/L 24 21(L) 25  Calcium 8.9 - 10.3 mg/dL 8.8(L) 8.9 9.0  Total Protein 6.5 - 8.1 g/dL 7.6 - 7.5  Total Bilirubin 0.3 - 1.2 mg/dL 2.3(H) - 2.6(H)  Alkaline Phos 38 - 126 U/L 132(H) - 136(H)  AST 15 - 41 U/L 38 - 27  ALT 0 - 44 U/L 17 - 17      RADIOGRAPHIC STUDIES: I have personally reviewed the radiological images as listed and agreed with the findings in the report. No results found.   ASSESSMENT &  PLAN: Antonela J Scottis a 57 y.o.female with a history of  HTN, HLD, Arthritis, Asthma, Sleep Apnea, and H. Pylori.   1. 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 Has been tolerating treatment well overall, but developed significant pancytopenia with severe symptomatic anemia felt to be related to Ibrance; Hgb 4.1 today -Ibrance held as of 9/26, she began aranesp 300 mcg, received on 9/20 and 10/9 -thrombocytopenia and neutropenia have improved  -She is Jehovah's Witness and will not accept blood transfusion;  -No history of GI bleed; colonoscopy in 2015 was normal. FOBT negative  -we previously and continually explain this is a crisis due to severe strain on the heart; she is at high risk for cardiac event  -patient lives alone, she has been receiving home care since discharge but is only approved for twice per week visits; Her daughters are worried this is not enough.  -I referred her to social work today to inquire if she is eligible to more frequent care and if she can get a home wheelchair.   -We strongly encouraged her to go to ED if she has new or worsening symptoms especially chest pain or increased dyspnea, she agrees  -we explained not to create undue stress on her body, but she may do light exercises while sitting in the chair to prevent deconditioning and decline. She knows to use assistance with ADLs.  -she will continue aranesp 300 mcg weekly for now, next dose due 10/17.  -Continue exemestane; will also hold xgeva for now -If labs do not improve in next couple weeks Dr. Burr Medico may recommend bone marrow biopsy to r/o diffuse bone metastasis or MDS, the patient is aware -Will check lab and f/u in 1 week for close monitoring.   2. Primary breast cancer of left breast overlapping sites diagnosed March of 2010and treated [facility]adjuvant[facility] with (a) paclitaxel weekly x12 and (b) doxorubicin/ cyclophosphamidein dose dense fashion x4, both  given with tifiparnib.  -definitive left modified radical mastectomy September of 2010 for a T2 N2 or stage IIIA invasive lobularbreast cancer, estrogen and progesterone receptor positive, HER-2 negative,  -post mastectomy radiation completed January of 2011, -briefly on tamoxifen, discontinued it because of uterine lining concerns. On anastrozolesince May 2012 with good tolerance. Held between November and December 2013 due to GI issues, unrelated to cancer or its treatment. -Discontinued by the patient summer of 2016  3. Bilateral rib cage pain, Leftchest wallpain -stable   4. Lower Extremity Edema -Repeat echocardiogram in 12/2016 and 09/2017 shows normal EF  5. Dyspnea, Cough -Uses Flovent -followed by pulmonologist Dr. Melvyn Novas  -now on home O2 2.5 liters via [place]  6. Insomnia   7. Left breast implant rupture, removed -She underwent breast reconstruction on 08/22/17 and had implant completely removed by Dr. Marica Otter   8. 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 Ninfa Linden on 08/22/17 which was found to be metastasis of her breast cancer.   9. Goal of care discussion  -she is full code now   PLAN:  -Labs reviewed -Hold ibrance and xgeva, continue exemestane -Continue aranesp 300 mcg weekly x3, next due 10/17 -Lab, f/u in 1 week  -Referral to social work  -Discuss increasing care with [facility]   Orders Placed This Encounter  Procedures  . Ambulatory referral to Social Work    Referral Priority:   Routine    Referral Type:   Consultation  Referral Reason:   Specialty Services Required    Number of Visits Requested:   1   All questions were answered. The patient knows to call the clinic with any problems, questions or concerns. No barriers to learning was detected.    Alla Feeling, NP 01/06/18   Addendum  I have reviewed the above documentation for accuracy and completeness, and I agree with the above.  Ms Michaux is very symptomatic  from her severe anemia.  She again declines blood transfusion.  She has received 2 doses of Aranesp, and I would increase the dose to 542mg every week (the highest dose we used for chemo induced anemia).  Her neutropenia and thrombocytopenia has been slowly improving, which is encouraging.  I think her pan[place]enia is likely [place] although diffuse metastatic breast cancer in her marrow is also possibility.  If her anemia does not improve in the next 1 to 2 weeks, I plan to obtain a bone marrow biopsy for further evaluation.  She is agreeable. Will follow her closely.   YTruitt Merle 01/06/2018

## 2018-01-06 NOTE — Telephone Encounter (Signed)
Printed avs and calender of upcoming appointment. Per 10/14 los 

## 2018-01-07 ENCOUNTER — Other Ambulatory Visit: Payer: Self-pay | Admitting: Hematology

## 2018-01-07 LAB — CANCER ANTIGEN 27.29: CA 27.29: 430.3 U/mL — ABNORMAL HIGH (ref 0.0–38.6)

## 2018-01-09 ENCOUNTER — Ambulatory Visit (INDEPENDENT_AMBULATORY_CARE_PROVIDER_SITE_OTHER): Payer: Medicare Other | Admitting: Physician Assistant

## 2018-01-09 ENCOUNTER — Encounter (INDEPENDENT_AMBULATORY_CARE_PROVIDER_SITE_OTHER): Payer: Self-pay | Admitting: Physician Assistant

## 2018-01-09 ENCOUNTER — Inpatient Hospital Stay: Payer: Medicare Other

## 2018-01-09 ENCOUNTER — Other Ambulatory Visit: Payer: Self-pay | Admitting: Hematology

## 2018-01-09 ENCOUNTER — Other Ambulatory Visit: Payer: Self-pay | Admitting: Nurse Practitioner

## 2018-01-09 ENCOUNTER — Telehealth (INDEPENDENT_AMBULATORY_CARE_PROVIDER_SITE_OTHER): Payer: Self-pay | Admitting: Physician Assistant

## 2018-01-09 VITALS — BP 128/77 | HR 79 | Temp 97.9°F | Ht 67.0 in | Wt 255.2 lb

## 2018-01-09 VITALS — BP 136/52 | HR 89 | Temp 98.7°F | Resp 18

## 2018-01-09 DIAGNOSIS — Z170 Estrogen receptor positive status [ER+]: Secondary | ICD-10-CM

## 2018-01-09 DIAGNOSIS — D493 Neoplasm of unspecified behavior of breast: Secondary | ICD-10-CM

## 2018-01-09 DIAGNOSIS — R059 Cough, unspecified: Secondary | ICD-10-CM

## 2018-01-09 DIAGNOSIS — C50812 Malignant neoplasm of overlapping sites of left female breast: Secondary | ICD-10-CM

## 2018-01-09 DIAGNOSIS — R05 Cough: Secondary | ICD-10-CM | POA: Diagnosis not present

## 2018-01-09 DIAGNOSIS — H6123 Impacted cerumen, bilateral: Secondary | ICD-10-CM

## 2018-01-09 DIAGNOSIS — C7951 Secondary malignant neoplasm of bone: Secondary | ICD-10-CM

## 2018-01-09 MED ORDER — MUCINEX DM MAXIMUM STRENGTH 60-1200 MG PO TB12: 1.0000 | ORAL_TABLET | Freq: Two times a day (BID) | ORAL | 0 refills | Status: DC

## 2018-01-09 MED ORDER — DARBEPOETIN ALFA 500 MCG/ML IJ SOSY
PREFILLED_SYRINGE | INTRAMUSCULAR | Status: AC
  Filled 2018-01-09: qty 1

## 2018-01-09 MED ORDER — SODIUM CHLORIDE 0.9 % IV SOLN
Freq: Once | INTRAVENOUS | Status: DC
  Filled 2018-01-09: qty 250

## 2018-01-09 MED ORDER — AMOXICILLIN-POT CLAVULANATE 875-125 MG PO TABS: 1.0000 | ORAL_TABLET | Freq: Two times a day (BID) | ORAL | 0 refills | Status: AC

## 2018-01-09 MED ORDER — DARBEPOETIN ALFA 500 MCG/ML IJ SOSY
500.0000 ug | PREFILLED_SYRINGE | Freq: Once | INTRAMUSCULAR | Status: AC
  Administered 2018-01-09: 500 ug via SUBCUTANEOUS

## 2018-01-09 MED ORDER — CARBAMIDE PEROXIDE 6.5 % OT SOLN: 5.0000 [drp] | Freq: Two times a day (BID) | OTIC | 0 refills | Status: DC

## 2018-01-09 MED ORDER — HYDROCOD POLST-CPM POLST ER 10-8 MG/5ML PO SUER: 5.0000 mL | Freq: Two times a day (BID) | ORAL | 0 refills | Status: DC | PRN

## 2018-01-09 NOTE — Patient Instructions (Signed)

## 2018-01-09 NOTE — Progress Notes (Signed)
Subjective:  Patient ID: Michaela Little, female    DOB: 01/01/1961  Age: 57 y.o. MRN: [id number]  CC:  Cough   HPI Michaela J Scottis a 57 y.o.femalewith a PMH of Anxiety, Breast CA, HTN, OSA, Asthma,cholecystectomy,and dyspnea presents with cough for several days. Cough associated with phlegm production. Frequent coughing is causing sleep disturbance and chest wall pain. No bloody sputum, fever, or chills. No close contacts with the same. Has not taken anything for relief. CXR on 01/01/18 negative for cardiopulmonary disease. 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 Is currently on Aranesp for profound anemia. Pt is [employer] and does not accept blood transfusions.     Outpatient Medications Prior to Visit  Medication Sig Dispense Refill  . carvedilol (COREG) 6.25 MG tablet Take 1 tablet (6.25 mg total) by mouth 2 (two) times daily with a meal. 180 tablet 3  . cetirizine (ZYRTEC) 10 MG tablet Take 10 mg by mouth daily.  11  . diphenhydrAMINE (BENADRYL) 25 mg capsule Take 1 capsule (25 mg total) by mouth every 6 (six) hours as needed for itching. 30 capsule 0  . exemestane (AROMASIN) 25 MG tablet Take 1 tablet (25 mg total) by mouth daily after breakfast. 90 tablet 1  . fluticasone furoate-vilanterol (BREO ELLIPTA) 100-25 MCG/INH AEPB Inhale 1 puff into the lungs daily. 28 each 5  . folic acid (FOLVITE) 1 MG tablet Take 1 tablet (1 mg total) by mouth daily. 30 tablet 0  . furosemide (LASIX) 20 MG tablet Take 1 tablet (20 mg total) by mouth daily as needed (for fluid retention.). 30 tablet 5  . ipratropium-albuterol (DUONEB) 0.5-2.5 (3) MG/3ML SOLN Take 3 mLs by nebulization every 6 (six) hours as needed. (Patient taking differently: Take 3 mLs by nebulization every 6 (six) hours as needed (wheezing/shortness of breath). ) 360 mL 5  . montelukast (SINGULAIR) 10 MG tablet Take 1 tablet (10 mg total) by mouth at bedtime. 30 tablet 5  . pantoprazole (PROTONIX)  40 MG tablet Take 1 tablet (40 mg total) by mouth daily. Take 30-60 min before first meal of the day 30 tablet 2  . promethazine (PHENERGAN) 25 MG tablet Take 1 tablet (25 mg total) by mouth every 6 (six) hours as needed for nausea or vomiting. 30 tablet 0  . ranitidine (ZANTAC) 300 MG tablet Take 1 tablet (300 mg total) by mouth at bedtime. (Patient taking differently: Take 300 mg by mouth at bedtime as needed (for indigestion.). ) 30 tablet 5  . vitamin B-12 1000 MCG tablet Take 1 tablet (1,000 mcg total) by mouth daily. 30 tablet 0  . traMADol (ULTRAM) 50 MG tablet Take 1-2 tablets (50-100 mg total) by mouth every 6 (six) hours as needed for moderate pain or severe pain. 30 tablet 0   No facility-administered medications prior to visit.      ROS Review of Systems  Constitutional: Negative for chills, fever and malaise/fatigue.  Eyes: Negative for blurred vision.  Respiratory: Positive for cough. Negative for shortness of breath.   Cardiovascular: Negative for chest pain and palpitations.       Chest wall pain  Gastrointestinal: Negative for abdominal pain and nausea.  Genitourinary: Negative for dysuria and hematuria.  Musculoskeletal: Negative for joint pain and myalgias.  Skin: Negative for rash.  Neurological: Negative for tingling and headaches.  Psychiatric/Behavioral: Negative for depression. The patient has insomnia (due to coughing). The patient is not nervous/anxious.     Objective:  BP  128/77 (BP Location: Right Arm, Patient Position: Sitting, Cuff Size: Large)   Pulse 79   Temp 97.9 F (36.6 C) (Oral)   Ht 5\' 7"  (1.702 m)   Wt 255 lb 3.2 oz (115.8 kg)   LMP 02/25/2009   SpO2 100%   BMI 39.97 kg/m   BP/Weight 01/09/2018 01/06/2018 09/32/6712  Systolic BP 458 099 833  Diastolic BP 77 69 63  Wt. (Lbs) 255.2 254.8 -  BMI 39.97 39.91 -      Physical Exam  Constitutional: She is oriented to person, place, and time.  Well developed, well nourished, seems  somewhat debilitated, NAD, polite, occasional cough, using portable oxygen, using walker  HENT:  Head: Normocephalic and atraumatic.  Mouth/Throat: No oropharyngeal exudate.  Cerumen impaction bilaterally. Turbinates moderately hypertrophic with nasal congestion. No frontal or maxillary sinus TTP. Mild postnasal drip  Eyes: No scleral icterus.  Neck: Normal range of motion. Neck supple. No thyromegaly present.  Cardiovascular: Normal rate, regular rhythm and normal heart sounds.  Pulmonary/Chest: Effort normal and breath sounds normal.  Musculoskeletal: She exhibits no edema.  Lymphadenopathy:    She has cervical adenopathy (mild anterior cervical lymphadenopathy).  Neurological: She is alert and oriented to person, place, and time.  Skin: Skin is warm and dry. No rash noted. No erythema. No pallor.  Psychiatric: She has a normal mood and affect. Her behavior is normal. Thought content normal.  Vitals reviewed.    Assessment & Plan:   1. Cough - CXR 01/01/18 negative for cardiopulmonary disease. I do not want to keep exposing this breast cancer patient to radiation especially with recent findings of pancytopenia 2/2 chemotherapy. Pt managed by oncology and will be going to oncology appointment in a few hours.  - Begin chlorpheniramine-HYDROcodone (TUSSIONEX PENNKINETIC ER) 10-8 MG/5ML SUER; Take 5 mLs by mouth every 12 (twelve) hours as needed for cough.  Dispense: 230 mL; Refill: 0 - Begin -clavulanate (AUGMENTIN) 875-125 MG tablet; Take 1 tablet by mouth 2 (two) times daily for 10 days.  Dispense: 20 tablet; Refill: 0 - Begin Dextromethorphan-guaiFENesin (MUCINEX DM MAXIMUM STRENGTH) 60-1200 MG TB12; Take 1 tablet by mouth 2 times daily at 12 noon and 4 pm.  Dispense: 28 each; Refill: 0   Meds ordered this encounter  Medications  . chlorpheniramine-HYDROcodone (TUSSIONEX PENNKINETIC ER) 10-8 MG/5ML SUER    Sig: Take 5 mLs by mouth every 12 (twelve) hours as needed for cough.     Dispense:  230 mL    Refill:  0    Order Specific Question:   Supervising Provider    Answer:   Charlott Rakes [[id number]]  . amoxicillin-clavulanate (AUGMENTIN) 875-125 MG tablet    Sig: Take 1 tablet by mouth 2 (two) times daily for 10 days.    Dispense:  20 tablet    Refill:  0    Order Specific Question:   Supervising Provider    Answer:   Charlott Rakes [[id number]]  . Dextromethorphan-guaiFENesin ([place] DM MAXIMUM STRENGTH) 60-1200 MG TB12    Sig: Take 1 tablet by mouth 2 times daily at 12 noon and 4 pm.    Dispense:  28 each    Refill:  0    Order Specific Question:   Supervising Provider    Answer:   Charlott Rakes [[id number]]    Follow-up: Return if symptoms worsen or fail to improve.   Clent Demark PA

## 2018-01-09 NOTE — Telephone Encounter (Signed)
[place] to inform that Ms.Rennie does not have regular bowl movement. States that she has questioned Ms. Balaban in regard of pain or discomfort and patient denies having any symptoms.   Thank you Emmit Pomfret

## 2018-01-10 NOTE — Telephone Encounter (Signed)
Pt has recently been prescribed Augmentin (antibiotic) for cough. It may be that taking Augmentin will help move the bowels along as many patients complain of loose stools when taking Augmentin. Call if constipation reaches 4-5 days of no BM.

## 2018-01-10 NOTE — Telephone Encounter (Signed)
Noted. Also fwd to PCP. Nat Christen, CMA

## 2018-01-10 NOTE — Telephone Encounter (Signed)
Patient states she had bowel movement today 01/10/18. Nat Christen, CMA

## 2018-01-13 ENCOUNTER — Encounter: Payer: Self-pay | Admitting: Physician Assistant

## 2018-01-13 ENCOUNTER — Ambulatory Visit: Payer: Medicare Other

## 2018-01-13 DIAGNOSIS — Z1231 Encounter for screening mammogram for malignant neoplasm of breast: Secondary | ICD-10-CM | POA: Diagnosis not present

## 2018-01-13 DIAGNOSIS — Z853 Personal history of malignant neoplasm of breast: Secondary | ICD-10-CM | POA: Diagnosis not present

## 2018-01-15 ENCOUNTER — Inpatient Hospital Stay: Payer: Medicare Other

## 2018-01-15 ENCOUNTER — Other Ambulatory Visit: Payer: Self-pay | Admitting: Hematology

## 2018-01-15 ENCOUNTER — Encounter: Payer: Self-pay | Admitting: Nurse Practitioner

## 2018-01-15 ENCOUNTER — Inpatient Hospital Stay (HOSPITAL_BASED_OUTPATIENT_CLINIC_OR_DEPARTMENT_OTHER): Payer: Medicare Other | Admitting: Nurse Practitioner

## 2018-01-15 ENCOUNTER — Ambulatory Visit (HOSPITAL_COMMUNITY)
Admission: RE | Admit: 2018-01-15 | Discharge: 2018-01-15 | Disposition: A | Discharge status: Home / Self Care | Payer: Medicare Other | Source: Ambulatory Visit | Attending: Nurse Practitioner | Admitting: Nurse Practitioner

## 2018-01-15 ENCOUNTER — Telehealth: Payer: Self-pay | Admitting: Nurse Practitioner

## 2018-01-15 VITALS — BP 129/50 | HR 79 | Temp 99.1°F | Resp 18

## 2018-01-15 DIAGNOSIS — C50812 Malignant neoplasm of overlapping sites of left female breast: Secondary | ICD-10-CM | POA: Diagnosis not present

## 2018-01-15 DIAGNOSIS — C7951 Secondary malignant neoplasm of bone: Secondary | ICD-10-CM

## 2018-01-15 DIAGNOSIS — I10 Essential (primary) hypertension: Secondary | ICD-10-CM

## 2018-01-15 DIAGNOSIS — R05 Cough: Secondary | ICD-10-CM

## 2018-01-15 DIAGNOSIS — Z170 Estrogen receptor positive status [ER+]: Secondary | ICD-10-CM

## 2018-01-15 DIAGNOSIS — C50912 Malignant neoplasm of unspecified site of left female breast: Secondary | ICD-10-CM

## 2018-01-15 DIAGNOSIS — D649 Anemia, unspecified: Secondary | ICD-10-CM | POA: Diagnosis not present

## 2018-01-15 DIAGNOSIS — Z7189 Other specified counseling: Secondary | ICD-10-CM

## 2018-01-15 DIAGNOSIS — R0609 Other forms of dyspnea: Secondary | ICD-10-CM

## 2018-01-15 DIAGNOSIS — D493 Neoplasm of unspecified behavior of breast: Secondary | ICD-10-CM | POA: Diagnosis present

## 2018-01-15 DIAGNOSIS — C785 Secondary malignant neoplasm of large intestine and rectum: Secondary | ICD-10-CM

## 2018-01-15 DIAGNOSIS — Z9981 Dependence on supplemental oxygen: Secondary | ICD-10-CM

## 2018-01-15 LAB — CBC WITH DIFFERENTIAL/PLATELET
Abs Immature Granulocytes: 0.2 10*3/uL — ABNORMAL HIGH (ref 0.00–0.07)
BASOS PCT: 1 %
Basophils Absolute: 0.1 10*3/uL (ref 0.0–0.1)
EOS PCT: 2 %
Eosinophils Absolute: 0.1 10*3/uL (ref 0.0–0.5)
HCT: 14 % — ABNORMAL LOW (ref 36.0–46.0)
HEMOGLOBIN: 4.1 g/dL — AB (ref 12.0–15.0)
Immature Granulocytes: 4 %
LYMPHS PCT: 39 %
Lymphs Abs: 2 10*3/uL (ref 0.7–4.0)
MCH: 28.7 pg (ref 26.0–34.0)
MCHC: 29.3 g/dL — AB (ref 30.0–36.0)
MCV: 97.9 fL (ref 80.0–100.0)
MONO ABS: 0.5 10*3/uL (ref 0.1–1.0)
Monocytes Relative: 10 %
NEUTROS ABS: 2.3 10*3/uL (ref 1.7–7.7)
NRBC: 1.4 % — AB (ref 0.0–0.2)
Neutrophils Relative %: 44 %
PLATELETS: 86 10*3/uL — AB (ref 150–400)
RBC: 1.43 MIL/uL — AB (ref 3.87–5.11)
RDW: 24.9 % — AB (ref 11.5–15.5)
WBC: 5.1 10*3/uL (ref 4.0–10.5)

## 2018-01-15 LAB — GLUCOSE, CAPILLARY: GLUCOSE-CAPILLARY: 97 mg/dL (ref 70–99)

## 2018-01-15 MED ORDER — DARBEPOETIN ALFA 300 MCG/0.6ML IJ SOSY
300.0000 ug | PREFILLED_SYRINGE | Freq: Once | INTRAMUSCULAR | Status: AC
  Administered 2018-01-15: 300 ug via SUBCUTANEOUS

## 2018-01-15 MED ORDER — DARBEPOETIN ALFA 300 MCG/0.6ML IJ SOSY
PREFILLED_SYRINGE | INTRAMUSCULAR | Status: AC
  Filled 2018-01-15: qty 0.6

## 2018-01-15 MED ORDER — FLUDEOXYGLUCOSE F - 18 (FDG) INJECTION
13.4000 | Freq: Once | INTRAVENOUS | Status: AC
  Administered 2018-01-15: 13.4 via INTRAVENOUS

## 2018-01-15 NOTE — Telephone Encounter (Signed)
TC to [facility] to see about when Pt's wheel chair would arrive to her Spoke with Corene Cornea at [facility] who stated that [facility]) was handling this because they received a prescription for a wheelchair, But her insurance requires a wheel chair narrative and what type of wheel chair that is needed. He also stated  forms were brought here personally by Santiago Glad  to be filled out  since 01/08/18 asked if he could fax over what needed to be filled out so we can expedite this.

## 2018-01-15 NOTE — Progress Notes (Signed)
[facility] [facility]  [phone] Fax:([phone]) [phone]  Clinic Follow up Note   Patient Care Team: [name] as PCP - General (Physician Assistant) [name], MD as Consulting Physician (Pulmonary Disease) [name], MD as Consulting Physician (Hematology) 01/15/2018  SUMMARY OF ONCOLOGIC HISTORY: Oncology History   Cancer Staging Malignant neoplasm of overlapping sites of left breast in female, estrogen receptor positive ([place]) Staging form: Breast, AJCC 7th Edition - Clinical: Stage IIIA (T2, N2, M0) - Unsigned       Malignant neoplasm of overlapping sites of left breast in female, estrogen receptor positive ([place])   05/2008 Cancer Diagnosis    Left sided breast cancer (no path report available)    11/2008 Pathologic Stage    Stage IIIA: T2 N2     Neo-Adjuvant Chemotherapy    Paclitaxel weekly x 12; doxorubicin and cyclophosphamide x 4 - both given with trifiparnib (treated at [facility] in [place])    11/2008 Definitive Surgery    Left modified radiation mastectomy: invasive lobular carcinoma, ER+, PR+, HER2/neu negative, 5/22 LN positive     - 03/2009 Radiation Therapy    Adjuvant radiation to left chest wall    2011 - 08/2014 Anti-estrogen oral therapy    Tamoxifen 20 mg used briefly; discontinued due to uterine lining concerns; changed to anastrozole 1 mg daily (began 07/2100); discontinued by patient summer of 2016    05/26/2015 Progression    Iliac bone biopsy showed metastatic carcinoma consistent with a breast primary, ER positive, PR and HER-2 negative. Her staging CT, and a PET scan was negative for visceral metastasis.    05/2015 -  Anti-estrogen oral therapy    1. letrozole and Ibrance started March 2017             -Ibrance dose decreased to 75 mg daily  -Letrozole switched to Exemestane on 02/13/17 due to b/l rib pain.     11/16/2015 Miscellaneous    Xgeva monthly for bone metastasis     07/03/2016 Imaging    CT chest,  abdomen and pelvis with contrast showed no evidence of metastasis.     02/07/2017 Imaging    CT AP W Contrast 02/07/17 IMPRESSION: 1. No CT findings for abdominal/pelvic metastatic disease. 2. No acute abdominal findings, mass lesions or adenopathy. 3. Status post cholecystectomy with mild associated common bile duct dilatation. 4. Somewhat thickened appendix appears relatively stable. No acute inflammatory process. 5. Stable mixed lytic and sclerotic osseous metastatic disease.     02/07/2017 Imaging    Bone Scan Whole Body 02/07/17 IMPRESSION: No scintigraphic evidence skeletal metastasis     08/12/2017 Imaging    Whole Boday Scan 08/12/17 IMPRESSION: Scattered degenerative type uptake as above with a questionable focus of increased tracer localization at L2 vertebral body versus artifact; no abnormality is seen at this site by CT. Consider either characterization by MR or attention on follow-up imaging.    08/12/2017 Imaging    CT AP W Contrast 08/12/17  IMPRESSION: Continued thickening of the appendix is noted without surrounding inflammation. It has maximum measured diameter is decreased compared to prior exam. There is no evidence of acute inflammation. Stable mixed lytic and sclerotic appearance of visualized skeleton is noted consistent with history of known osseous metastases. No acute abnormality seen in the abdomen or pelvis.    08/22/2017 Surgery    APPENDECTOMY LAPAROSCOPIC ERAS PATHWAY by Dr. Malcolm Metro and REMOVAL OF LEFT TISSUE EXPANDER by Dr. Harlow Mares 08/22/17    08/22/2017 Pathology  Results    Diagnosis 08/22/17  1. Appendix, Other than Incidental - METASTATIC CARCINOMA, CONSISTENT WITH BREAST PRIMARY. - CARCINOMA IS PRESENT AT THE SURGICAL RESECTION MARGIN AS WELL AS THE SEROSAL SURFACE. - LYMPHOVASCULAR INVASION IS IDENTIFIED. - SEE COMMENT. 2. Breast, capsule, Left - DENSE FIBROUS TISSUE WITH MILD INFLAMMATION, INCLUDING A FEW SCATTERED MULTINUCLEATED  GIANT CELLS. - THERE IS NO EVIDENCE OF MALIGNANCY.    08/27/2017 Imaging    CT AP W Contrast 08/27/17 IMPRESSION: No evidence of pulmonary embolus. Small pericardial effusion with uncertain clinical significance. No evidence of acute abnormalities within the chest, abdomen or pelvis. Area of fat stranding and small focus of gas within the anterior abdominal wall, immediately inferior to the umbilicus, probably at the site of laparoscopic access.    10/07/2017 Echocardiogram    10/07/2017 ECO Study Conclusions  - Procedure narrative: Transthoracic echocardiography. Image   quality was adequate. The study was technically difficult, as a   result of poor acoustic windows. - Left ventricle: The cavity size was normal. Wall thickness was   normal. Systolic function was normal. The estimated ejection   fraction was in the range of 60% to 65%. Wall motion was normal;   there were no regional wall motion abnormalities. Features are   consistent with a pseudonormal left ventricular filling pattern,   with concomitant abnormal relaxation and increased filling   pressure (grade 2 diastolic dysfunction).   CURRENT TREATMENT: 1. letrozole and Ibrancestarted March 2017 -Ibrance dose decreased to 75 mg daily, held on 06/03/2017 due to surgery, restarted around 09/19/17, stopped on 12/19/2017 due to severe pancytopenia -Letrozole switched to Exemestane on 02/13/17 due to b/l rib pain.  2. Xgevastarted 11/16/2015, repeated monthly; on hold   INTERVAL HISTORY: Michaela Little is here for work in visit for cough and emesis. She is coughing frequently especially during the night. Phlegm is mostly white, occasionally brown. She has chest pressure after coughing. She vomits after coughing fit, but does not feel nauseous otherwise. She is persistently dyspneic with little exertion. Able to ambulate to the bathroom then rests. Heart rate feels rapid after activity. Remains on 2 liters O2. For cough, taking  augmentin per PCP, mucinex, and respiratory meds. Denies fever or chills. Able to eat and drink. She continues to feel very fatigued, but stable.    MEDICAL HISTORY:  Past Medical History:  Diagnosis Date  . Anxiety   . Arthritis    hands, knees, hips  . Asthma   . Bronchitis   . Cancer ([place])   . Cervical stenosis (uterine cervix)   . Disorder of appendix    Enlarged  . Dyspnea on exertion   . GERD (gastroesophageal reflux disease)   . Headache(784.0)   . History of breast cancer    2010--  LEFT  s/p  mastectomy (in [place]) AND CHEMORADIATION--  NO RECURRENCE  . History of cervical dysplasia   . Hyperlipidemia   . Hypertension   . Malignant neoplasm of overlapping sites of left breast in female, estrogen receptor positive ([place]) 03/05/2013  . OSA (obstructive sleep apnea) moderate osa per study  09/2010   CPAP  , NOT USING ON REGULAR BASIS  . Pelvic pain in female   . Positive H. pylori test    08-05-2013  . Positive TB test    AS TEEN--  [place] W/ MEDS  . Refusal of blood transfusions as patient is Jehovah's Witness   . Seasonal allergies   . Uterine fibroid   . Wears glasses  SURGICAL HISTORY: Past Surgical History:  Procedure Laterality Date  . APPENDECTOMY  y  . CARDIOVASCULAR STRESS TEST  10-29-2012   low risk perfusion study/  no significant reversibity/ ef 66%/  normal wall motion  . CERVICAL CONIZATION W/BX  2012   in  [place] N/A 05/14/2012   Procedure: LAPAROSCOPIC CHOLECYSTECTOMY;  Surgeon: Ralene Ok, MD;  Location: [facility];  Service: General;  Laterality: N/A;  . COLONOSCOPY  2011   normal per patient - [place]  . DILATION AND CURETTAGE OF UTERUS  10/15/2011   Procedure: DILATATION AND CURETTAGE;  Surgeon: Melina Schools, MD;  Location: [facility];  Service: Gynecology;  Laterality: N/A;  Conization &  endocervical curettings  . EXAMINATION UNDER ANESTHESIA N/A 08/20/2013   Procedure: EXAM UNDER ANESTHESIA;  Surgeon: Margarette Asal, MD;  Location:  [facility];  Service: Gynecology;  Laterality: N/A;  . [place]   right  . LAPAROSCOPIC APPENDECTOMY N/A 08/22/2017   Procedure: [place];  Surgeon: Coralie Keens, MD;  Location: [facility];  Service: General;  Laterality: N/A;  . LAPAROSCOPIC ASSISTED VAGINAL HYSTERECTOMY N/A 10/26/2014   Procedure: HYSTERECTOMY ABDOMINAL ;  Surgeon: Molli Posey, MD;  Location: [facility];  Service: Gynecology;  Laterality: N/A;  . MASTECTOMY Left 11/2008  in [place]   . SALPINGOOPHORECTOMY Bilateral 10/26/2014   Procedure: BILATERAL SALPINGO OOPHORECTOMY;  Surgeon: Molli Posey, MD;  Location: [facility];  Service: Gynecology;  Laterality: Bilateral;  . TISSUE EXPANDER PLACEMENT Left 08/22/2017   Procedure: REMOVAL OF LEFT TISSUE EXPANDER;  Surgeon: Crissie Reese, MD;  Location: [facility];  Service: Plastics;  Laterality: Left;  . TISSUE EXPANDER REMOVAL Left 08/22/2017  . TRANSTHORACIC ECHOCARDIOGRAM  10-29-2012   mild lvh/  ef 60-65%/  grade II diastolic dysfunction/  trivial mr  &  tr    I have reviewed the social history and family history with the patient and they are unchanged from previous note.  ALLERGIES:  is allergic to lisinopril-hydrochlorothiazide; adhesive [tape]; fentanyl; gabapentin; hctz [hydrochlorothiazide]; latex; lisinopril; losartan potassium; other; oxycodone; and tums [calcium carbonate antacid].  MEDICATIONS:  Current Outpatient Medications  Medication Sig Dispense Refill  . amoxicillin-clavulanate (AUGMENTIN) 875-125 MG tablet Take 1 tablet by mouth 2 (two) times daily for 10 days. 20 tablet 0  . carbamide peroxide (DEBROX) 6.5 % OTIC solution Place 5 drops into both ears 2 (two) times daily. 15 mL 0  . carvedilol (COREG) 6.25 MG tablet Take 1 tablet (6.25 mg total) by mouth 2 (two) times daily with a meal. 180 tablet 3  . cetirizine (ZYRTEC) 10 MG tablet Take 10 mg by mouth daily.  11  . chlorpheniramine-HYDROcodone  (TUSSIONEX PENNKINETIC ER) 10-8 MG/5ML SUER Take 5 mLs by mouth every 12 (twelve) hours as needed for cough. 230 mL 0  . Dextromethorphan-guaiFENesin (MUCINEX DM MAXIMUM STRENGTH) 60-1200 MG TB12 Take 1 tablet by mouth 2 times daily at 12 noon and 4 pm. 28 each 0  . diphenhydrAMINE (BENADRYL) 25 mg capsule Take 1 capsule (25 mg total) by mouth every 6 (six) hours as needed for itching. 30 capsule 0  . exemestane (AROMASIN) 25 MG tablet Take 1 tablet (25 mg total) by mouth daily after breakfast. 90 tablet 1  . fluticasone furoate-vilanterol (BREO ELLIPTA) 100-25 MCG/INH AEPB Inhale 1 puff into the lungs daily. 28 each 5  . folic acid (FOLVITE) 1 MG tablet Take 1 tablet (1 mg total) by mouth  daily. 30 tablet 0  . furosemide (LASIX) 20 MG tablet Take 1 tablet (20 mg total) by mouth daily as needed (for fluid retention.). 30 tablet 5  . ipratropium-albuterol (DUONEB) 0.5-2.5 (3) MG/3ML SOLN Take 3 mLs by nebulization every 6 (six) hours as needed. (Patient taking differently: Take 3 mLs by nebulization every 6 (six) hours as needed (wheezing/shortness of breath). ) 360 mL 5  . montelukast (SINGULAIR) 10 MG tablet Take 1 tablet (10 mg total) by mouth at bedtime. 30 tablet 5  . pantoprazole (PROTONIX) 40 MG tablet Take 1 tablet (40 mg total) by mouth daily. Take 30-60 min before first meal of the day 30 tablet 2  . promethazine (PHENERGAN) 25 MG tablet Take 1 tablet (25 mg total) by mouth every 6 (six) hours as needed for nausea or vomiting. 30 tablet 0  . ranitidine (ZANTAC) 300 MG tablet Take 1 tablet (300 mg total) by mouth at bedtime. (Patient taking differently: Take 300 mg by mouth at bedtime as needed (for indigestion.). ) 30 tablet 5  . vitamin B-12 1000 MCG tablet Take 1 tablet (1,000 mcg total) by mouth daily. 30 tablet 0   No current facility-administered medications for this visit.    Facility-Administered Medications Ordered in Other Visits  Medication Dose Route Frequency Provider Last Rate  Last Dose  . 0.9 %  sodium chloride infusion   Intravenous Once [name], MD        PHYSICAL EXAMINATION: ECOG PERFORMANCE STATUS: 3 - Symptomatic, >50% confined to bed  Vitals:   01/15/18 1229  BP: (!) 129/50  Pulse: 79  Resp: 18  Temp: 99.1 F (37.3 C)  SpO2: 100%   GENERAL:alert, no distress and comfortable SKIN: no rashes or significant lesions EYES: sclera clear OROPHARYNX: pallor, no thrush or ulcers  LYMPH:  no palpable cervical lymphadenopathy LUNGS: clear to auscultation, occasional wheezing; normal breathing effort HEART: regular rate & rhythm, no lower extremity edema ABDOMEN:abdomen soft, non-tender and normal bowel sounds Musculoskeletal: no cyanosis of digits and no clubbing  NEURO: alert & oriented x 3 with fluent speech Breast exam deferred   LABORATORY DATA:  I have reviewed the data as listed CBC Latest Ref Rng & Units 01/15/2018 01/06/2018 01/03/2018  WBC 4.0 - 10.5 K/uL 5.1 3.6(L) 3.1(L)  Hemoglobin 12.0 - 15.0 g/dL 4.1(LL) 4.1(LL) 3.9(LL)  Hematocrit 36.0 - 46.0 % 14.0(L) 13.8(L) 12.7(L)  Platelets 150 - 400 K/uL 86(L) 95(L) 80(L)     CMP Latest Ref Rng & Units 01/06/2018 01/01/2018 12/23/2017  Glucose 70 - 99 mg/dL 157(H) 133(H) 116(H)  BUN 6 - 20 mg/dL 13 6 12   Creatinine 0.44 - 1.00 mg/dL 0.71 0.68 0.82  Sodium 135 - 145 mmol/L 141 137 137  Potassium 3.5 - 5.1 mmol/L 3.7 3.5 4.0  Chloride 98 - 111 mmol/L 108 109 105  CO2 22 - 32 mmol/L 24 21(L) 25  Calcium 8.9 - 10.3 mg/dL 8.8(L) 8.9 9.0  Total Protein 6.5 - 8.1 g/dL 7.6 - 7.5  Total Bilirubin 0.3 - 1.2 mg/dL 2.3(H) - 2.6(H)  Alkaline Phos 38 - 126 U/L 132(H) - 136(H)  AST 15 - 41 U/L 38 - 27  ALT 0 - 44 U/L 17 - 17      RADIOGRAPHIC STUDIES: I have personally reviewed the radiological images as listed and agreed with the findings in the report. No results found.   ASSESSMENT & PLAN: Neetu J Scottis a 57 y.o.female with a history of HTN, HLD, Arthritis, Asthma, Sleep Apnea, and  H.  Pylori.   1. 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 Had been tolerating treatment well overall, but developed significant pancytopenia with severe symptomatic anemia felt to be related to Joycelyn Man held as of 9/26, she began aranesp 300 mcg on 9/20, weekly  -thrombocytopenia and neutropenia have improved; ANC is normal now, plt stable but remains thrombocytopenic, PLT 86K today. She denies bleeding  -Sheis Jehovah's Witness and will not accept blood transfusion; -No history of GI bleed; colonoscopy in 2015 was normal. FOBT negative  -we previously and continually explain this is a crisis due to severe strain on the heart; she is at high risk for cardiac event  -patient lives alone, she has been receiving home care since discharge but is only approved for twice per week visits; Her daughters are staying with her.  -Referred her to social work today to inquire if she is eligible to more frequent care and if she can get a home wheelchair; will f/u, as wheelchair has not been delivered yet -We strongly encouraged her to go to [facility] she has new or worsening symptomsespecially chest pain or increased dyspnea, she agrees -we explained not to create undue stress on her body, but she may do light exercises while sitting in the chair to prevent deconditioning and decline. She knows to use assistance with ADLs.  -she will continue aranesp 300 mcg weekly for now, last dose 10/17.  -Continue exemestane; will also hold xgeva for now -she is getting PET scan later today, to r/o diffuse bone metastasis and disease progression. Dr. Burr Medico has discussed possible bone marrow biopsy as well, as her anemia has not improved. If she does have disease progression, her options are limited due to her poor performance status and severe anemia, I explained this to the patient today, she appears to understand.  -Hgb  4.1 today, no improvement; she declined blood transfusion again today -will call her with PET scan results, to avoid another trip to [facility], she will return in 1 week for lab, f/u and aranesp   2. Primary breast cancer of left breast overlapping sites diagnosed March of 2010and treated neo-adjuvantly at [facility] with (a) paclitaxel weekly x12 and (b) doxorubicin/ cyclophosphamidein dose dense fashion x4, both given with tifiparnib.  -definitive left modified radical mastectomy September of 2010 for a T2 N2 or stage IIIA invasive lobularbreast cancer, estrogen and progesterone receptor positive, HER-2 negative,  -post mastectomy radiation completed January of 2011, -briefly on tamoxifen, discontinued it because of uterine lining concerns. On anastrozolesince May 2012 with good tolerance. Held between November and December 2013 due to GI issues, unrelated to cancer or its treatment.  -Discontinued by the patient summer of 2016  3. Bilateral rib cage pain, Leftchest wallpain -stable  4. Lower Extremity Edema -Repeat echocardiogram in 12/2016 and 09/2017 shows normal EF  5. Dyspnea, Cough -Uses Flovent -followed bypulmonologist Dr. Melvyn Novas  -now on home O2 2.5 liters via [place] -recently started augmentin and mucinex per PCP, cough not much better; afebrile.   6. Insomnia   7. Left breast implant rupture, removed -She underwent breast reconstruction on 08/22/17 and had implant completely removed by Dr. Marica Otter   8. 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 Ninfa Linden on 08/22/17 which was found to be metastasis of her breast cancer.   9. Goal of care discussion  -she is full code now  PLAN: -Labs reviewed, Hgb 4.1  -continue  aranesp 300 mcg weekly, due today -PET scan today, will call with results to limit trips back and forth to [facility] -lab, aranesp weekly -f/u in 1 week   All questions were answered. The patient knows to call the clinic with any  problems, questions or concerns. No barriers to learning was detected.    Alla Feeling, NP 01/15/18

## 2018-01-16 ENCOUNTER — Inpatient Hospital Stay: Payer: Medicare Other | Admitting: Nurse Practitioner

## 2018-01-16 ENCOUNTER — Telehealth: Payer: Self-pay | Admitting: Nurse Practitioner

## 2018-01-16 ENCOUNTER — Inpatient Hospital Stay: Payer: Medicare Other

## 2018-01-16 ENCOUNTER — Telehealth: Payer: Self-pay | Admitting: Hematology

## 2018-01-16 MED ORDER — DENOSUMAB 120 MG/1.7ML ~~LOC~~ SOLN
SUBCUTANEOUS | Status: AC
  Filled 2018-01-16: qty 1.7

## 2018-01-16 NOTE — Telephone Encounter (Signed)
Appts scheduled patient notified per 10/23 los °

## 2018-01-16 NOTE — Telephone Encounter (Signed)
Per previous note on 01/15/18 Forms for wheel chair faxed back to [place] fax # [phone] att: to Santiago Glad

## 2018-01-17 ENCOUNTER — Telehealth: Payer: Self-pay | Admitting: Hematology

## 2018-01-17 ENCOUNTER — Other Ambulatory Visit: Payer: Self-pay | Admitting: Hematology

## 2018-01-17 MED ORDER — HYDROCODONE-HOMATROPINE 5-1.5 MG/5ML PO SYRP: 5.0000 mL | ORAL_SOLUTION | Freq: Four times a day (QID) | ORAL | 0 refills | Status: DC | PRN

## 2018-01-17 NOTE — Telephone Encounter (Signed)
I called patient today, to review her recent PET scan results.  It showed diffuse bone marrow activity, likely secondary to the Aranesp injection.  No other evidence of new metastasis.  Diffuse sclerotic bone lesions similar to recent CT scan.  She has persistent dry cough, no fever, previous cough medicines are not working, according Hycodan to her pharmacy today.  To see her back next week.  [name]  01/17/2018

## 2018-01-21 ENCOUNTER — Telehealth: Payer: Self-pay | Admitting: Hematology

## 2018-01-21 ENCOUNTER — Telehealth: Payer: Self-pay

## 2018-01-21 NOTE — Telephone Encounter (Signed)
Cancelled appt per 10/29 sch message - per patient request - she did not want to r/s she wanted to cancel -

## 2018-01-21 NOTE — Telephone Encounter (Signed)
Okay to see patient one time weekly for 3 more weeks, no need to draw blood coming to [facility] weekly for injections.

## 2018-01-22 ENCOUNTER — Telehealth: Payer: Self-pay

## 2018-01-22 ENCOUNTER — Other Ambulatory Visit: Payer: Medicare Other

## 2018-01-22 ENCOUNTER — Inpatient Hospital Stay: Payer: Medicare Other

## 2018-01-22 ENCOUNTER — Inpatient Hospital Stay: Payer: Medicare Other | Admitting: Hematology

## 2018-01-22 ENCOUNTER — Ambulatory Visit: Payer: Medicare Other

## 2018-01-22 DIAGNOSIS — I824Y1 Acute embolism and thrombosis of unspecified deep veins of right proximal lower extremity: Secondary | ICD-10-CM | POA: Insufficient documentation

## 2018-01-22 NOTE — Telephone Encounter (Signed)
Faxed signed orders to [facility].  

## 2018-01-23 ENCOUNTER — Ambulatory Visit: Payer: Medicare Other

## 2018-01-28 ENCOUNTER — Telehealth: Payer: Self-pay | Admitting: *Deleted

## 2018-01-28 ENCOUNTER — Telehealth: Payer: Self-pay | Admitting: Hematology

## 2018-01-28 MED ORDER — BENZONATATE 100 MG PO CAPS
100.00 | ORAL_CAPSULE | ORAL | Status: DC
Start: 2018-01-28 — End: 2018-01-28

## 2018-01-28 MED ORDER — EPOETIN ALFA-EPBX 40000 UNIT/ML IJ SOLN
40000.00 | INTRAMUSCULAR | Status: DC
Start: 2018-01-28 — End: 2018-01-28

## 2018-01-28 MED ORDER — MONTELUKAST SODIUM 10 MG PO TABS
10.00 | ORAL_TABLET | ORAL | Status: DC
Start: 2018-01-27 — End: 2018-01-28

## 2018-01-28 MED ORDER — IPRATROPIUM-ALBUTEROL 0.5-2.5 (3) MG/3ML IN SOLN
3.00 | RESPIRATORY_TRACT | Status: DC
Start: ? — End: 2018-01-28

## 2018-01-28 MED ORDER — GENERIC EXTERNAL MEDICATION
8.00 | Status: DC
Start: ? — End: 2018-01-28

## 2018-01-28 MED ORDER — ACETAMINOPHEN 325 MG PO TABS
650.00 | ORAL_TABLET | ORAL | Status: DC
Start: ? — End: 2018-01-28

## 2018-01-28 MED ORDER — EXEMESTANE 25 MG PO TABS
25.00 | ORAL_TABLET | ORAL | Status: DC
Start: 2018-01-28 — End: 2018-01-28

## 2018-01-28 MED ORDER — ENOXAPARIN SODIUM 80 MG/0.8ML ~~LOC~~ SOLN
80.00 | SUBCUTANEOUS | Status: DC
Start: 2018-01-27 — End: 2018-01-28

## 2018-01-28 MED ORDER — HYDROXYZINE HCL 10 MG PO TABS
10.00 | ORAL_TABLET | ORAL | Status: DC
Start: ? — End: 2018-01-28

## 2018-01-28 MED ORDER — GENERIC EXTERNAL MEDICATION
10.00 | Status: DC
Start: ? — End: 2018-01-28

## 2018-01-28 MED ORDER — MELATONIN 3 MG PO TABS
3.00 | ORAL_TABLET | ORAL | Status: DC
Start: 2018-01-27 — End: 2018-01-28

## 2018-01-28 MED ORDER — GUAIFENESIN-DM 100-10 MG/5ML PO SYRP
10.00 | ORAL_SOLUTION | ORAL | Status: DC
Start: ? — End: 2018-01-28

## 2018-01-28 MED ORDER — BUDESONIDE-FORMOTEROL FUMARATE 160-4.5 MCG/ACT IN AERO
2.00 | INHALATION_SPRAY | RESPIRATORY_TRACT | Status: DC
Start: 2018-01-27 — End: 2018-01-28

## 2018-01-28 MED ORDER — FOLIC ACID 1 MG PO TABS
1.00 | ORAL_TABLET | ORAL | Status: DC
Start: 2018-01-28 — End: 2018-01-28

## 2018-01-28 MED ORDER — GENERIC EXTERNAL MEDICATION
1000.00 | Status: DC
Start: 2018-01-28 — End: 2018-01-28

## 2018-01-28 MED ORDER — CEFUROXIME AXETIL 500 MG PO TABS
500.00 | ORAL_TABLET | ORAL | Status: DC
Start: 2018-01-27 — End: 2018-01-28

## 2018-01-28 MED ORDER — LIDOCAINE HCL (PF) 1 % IJ SOLN
0.50 | INTRAMUSCULAR | Status: DC
Start: ? — End: 2018-01-28

## 2018-01-28 NOTE — Telephone Encounter (Signed)
Pt states she was discharged from [facility] yesterday. Needs a follow up with Dr Burr Medico ASAP.

## 2018-01-28 NOTE — Telephone Encounter (Signed)
Patient is aware of appt per 11/5 sch message.

## 2018-01-29 ENCOUNTER — Telehealth: Payer: Self-pay

## 2018-01-29 ENCOUNTER — Encounter: Payer: Self-pay | Admitting: Hematology

## 2018-01-29 ENCOUNTER — Inpatient Hospital Stay: Payer: Medicare Other

## 2018-01-29 ENCOUNTER — Telehealth: Payer: Self-pay | Admitting: Hematology

## 2018-01-29 ENCOUNTER — Other Ambulatory Visit: Payer: Self-pay

## 2018-01-29 ENCOUNTER — Inpatient Hospital Stay (HOSPITAL_BASED_OUTPATIENT_CLINIC_OR_DEPARTMENT_OTHER): Payer: Medicare Other | Admitting: Hematology

## 2018-01-29 ENCOUNTER — Inpatient Hospital Stay: Payer: Medicare Other | Attending: Hematology

## 2018-01-29 VITALS — BP 141/60 | HR 87 | Temp 98.9°F | Resp 18 | Ht 67.0 in | Wt 247.4 lb

## 2018-01-29 DIAGNOSIS — Z170 Estrogen receptor positive status [ER+]: Secondary | ICD-10-CM

## 2018-01-29 DIAGNOSIS — C785 Secondary malignant neoplasm of large intestine and rectum: Secondary | ICD-10-CM | POA: Diagnosis not present

## 2018-01-29 DIAGNOSIS — D61818 Other pancytopenia: Secondary | ICD-10-CM | POA: Insufficient documentation

## 2018-01-29 DIAGNOSIS — I82401 Acute embolism and thrombosis of unspecified deep veins of right lower extremity: Secondary | ICD-10-CM | POA: Insufficient documentation

## 2018-01-29 DIAGNOSIS — R05 Cough: Secondary | ICD-10-CM | POA: Insufficient documentation

## 2018-01-29 DIAGNOSIS — C7951 Secondary malignant neoplasm of bone: Secondary | ICD-10-CM

## 2018-01-29 DIAGNOSIS — C50812 Malignant neoplasm of overlapping sites of left female breast: Secondary | ICD-10-CM | POA: Insufficient documentation

## 2018-01-29 DIAGNOSIS — D649 Anemia, unspecified: Secondary | ICD-10-CM

## 2018-01-29 DIAGNOSIS — R5383 Other fatigue: Secondary | ICD-10-CM

## 2018-01-29 DIAGNOSIS — C50919 Malignant neoplasm of unspecified site of unspecified female breast: Secondary | ICD-10-CM

## 2018-01-29 LAB — CBC WITH DIFFERENTIAL (CANCER CENTER ONLY)
ABS IMMATURE GRANULOCYTES: 0.33 10*3/uL — AB (ref 0.00–0.07)
BASOS ABS: 0 10*3/uL (ref 0.0–0.1)
Basophils Relative: 0 %
Eosinophils Absolute: 0.1 10*3/uL (ref 0.0–0.5)
Eosinophils Relative: 2 %
HCT: 14.6 % — ABNORMAL LOW (ref 36.0–46.0)
Hemoglobin: 4.2 g/dL — CL (ref 12.0–15.0)
IMMATURE GRANULOCYTES: 7 %
LYMPHS ABS: 1.2 10*3/uL (ref 0.7–4.0)
LYMPHS PCT: 25 %
MCH: 29 pg (ref 26.0–34.0)
MCHC: 28.8 g/dL — AB (ref 30.0–36.0)
MCV: 100.7 fL — ABNORMAL HIGH (ref 80.0–100.0)
Monocytes Absolute: 0.6 10*3/uL (ref 0.1–1.0)
Monocytes Relative: 12 %
NEUTROS PCT: 54 %
NRBC: 9.8 % — AB (ref 0.0–0.2)
Neutro Abs: 2.6 10*3/uL (ref 1.7–7.7)
Platelet Count: 157 10*3/uL (ref 150–400)
RBC: 1.45 MIL/uL — ABNORMAL LOW (ref 3.87–5.11)
RDW: 21.2 % — AB (ref 11.5–15.5)
WBC Count: 4.8 10*3/uL (ref 4.0–10.5)

## 2018-01-29 MED ORDER — DARBEPOETIN ALFA 300 MCG/0.6ML IJ SOSY
PREFILLED_SYRINGE | INTRAMUSCULAR | Status: AC
  Filled 2018-01-29: qty 0.6

## 2018-01-29 MED ORDER — DARBEPOETIN ALFA 300 MCG/0.6ML IJ SOSY
300.0000 ug | PREFILLED_SYRINGE | Freq: Once | INTRAMUSCULAR | Status: AC
  Administered 2018-01-29: 300 ug via SUBCUTANEOUS

## 2018-01-29 MED ORDER — HYDROCODONE-HOMATROPINE 5-1.5 MG/5ML PO SYRP: 5.0000 mL | ORAL_SOLUTION | Freq: Four times a day (QID) | ORAL | 0 refills | Status: DC | PRN

## 2018-01-29 MED FILL — HYDROCODONE-HOMATROPINE SYR: 5-1.5 | 6 days supply | Qty: 120 | Fill #0

## 2018-01-29 NOTE — Patient Instructions (Signed)

## 2018-01-29 NOTE — Progress Notes (Signed)
Michaela Little  [phone] [phone]  Clinic Follow up Note   Patient Care Team: [name] as PCP - General (Physician Assistant) [name], MD as Consulting Physician (Pulmonary Disease) [name], MD as Consulting Physician (Hematology)   Date of Service:  01/29/2018   CHIEF COMPLAINT: F/U after recent hospitalization    SUMMARY OF ONCOLOGIC HISTORY: Oncology History   Cancer Staging Malignant neoplasm of overlapping sites of left breast in female, estrogen receptor positive ([place]) Staging form: Breast, AJCC 7th Edition - Clinical: Stage IIIA (T2, N2, M0) - Unsigned       Malignant neoplasm of overlapping sites of left breast in female, estrogen receptor positive ([place])   05/2008 Cancer Diagnosis    Left sided breast cancer (no path report available)    11/2008 Pathologic Stage    Stage IIIA: T2 N2     Neo-Adjuvant Chemotherapy    Paclitaxel weekly x 12; doxorubicin and cyclophosphamide x 4 - both given with trifiparnib (treated at [facility] in [place])    11/2008 Definitive Surgery    Left modified radiation mastectomy: invasive lobular carcinoma, ER+, PR+, HER2/neu negative, 5/22 LN positive     - 03/2009 Radiation Therapy    Adjuvant radiation to left chest wall    2011 - 08/2014 Anti-estrogen oral therapy    Tamoxifen 20 mg used briefly; discontinued due to uterine lining concerns; changed to anastrozole 1 mg daily (began 07/2100); discontinued by patient summer of 2016    05/26/2015 Progression    Iliac bone biopsy showed metastatic carcinoma consistent with a breast primary, ER positive, PR and HER-2 negative. Her staging CT, and a PET scan was negative for visceral metastasis.    05/2015 -  Anti-estrogen oral therapy    1. letrozole and Ibrance started March 2017             -Ibrance dose decreased to 75 mg daily  -Letrozole switched to Exemestane on 02/13/17 due to b/l rib pain.     11/16/2015 Miscellaneous    Xgeva monthly for bone metastasis     07/03/2016 Imaging    CT chest, abdomen and pelvis with contrast showed no evidence of metastasis.     02/07/2017 Imaging    CT AP W Contrast 02/07/17 IMPRESSION: 1. No CT findings for abdominal/pelvic metastatic disease. 2. No acute abdominal findings, mass lesions or adenopathy. 3. Status post cholecystectomy with mild associated common bile duct dilatation. 4. Somewhat thickened appendix appears relatively stable. No acute inflammatory process. 5. Stable mixed lytic and sclerotic osseous metastatic disease.     02/07/2017 Imaging    Bone Scan Whole Body 02/07/17 IMPRESSION: No scintigraphic evidence skeletal metastasis     08/12/2017 Imaging    Whole Boday Scan 08/12/17 IMPRESSION: Scattered degenerative type uptake as above with a questionable focus of increased tracer localization at L2 vertebral body versus artifact; no abnormality is seen at this site by CT. Consider either characterization by MR or attention on follow-up imaging.    08/12/2017 Imaging    CT AP W Contrast 08/12/17  IMPRESSION: Continued thickening of the appendix is noted without surrounding inflammation. It has maximum measured diameter is decreased compared to prior exam. There is no evidence of acute inflammation. Stable mixed lytic and sclerotic appearance of visualized skeleton is noted consistent with history of known osseous metastases. No acute abnormality seen in the abdomen or pelvis.    08/22/2017 Surgery    APPENDECTOMY LAPAROSCOPIC ERAS PATHWAY by Dr. Malcolm Metro  and REMOVAL OF LEFT TISSUE EXPANDER by Dr. Harlow Mares 08/22/17    08/22/2017 Pathology Results    Diagnosis 08/22/17  1. Appendix, Other than Incidental - METASTATIC CARCINOMA, CONSISTENT WITH BREAST PRIMARY. - CARCINOMA IS PRESENT AT THE SURGICAL RESECTION MARGIN AS WELL AS THE SEROSAL SURFACE. - LYMPHOVASCULAR INVASION IS IDENTIFIED. - SEE COMMENT. 2. Breast, capsule, Left - DENSE FIBROUS  TISSUE WITH MILD INFLAMMATION, INCLUDING A FEW SCATTERED MULTINUCLEATED GIANT CELLS. - THERE IS NO EVIDENCE OF MALIGNANCY.    08/27/2017 Imaging    CT AP W Contrast 08/27/17 IMPRESSION: No evidence of pulmonary embolus. Small pericardial effusion with uncertain clinical significance. No evidence of acute abnormalities within the chest, abdomen or pelvis. Area of fat stranding and small focus of gas within the anterior abdominal wall, immediately inferior to the umbilicus, probably at the site of laparoscopic access.    10/07/2017 Echocardiogram    10/07/2017 ECO Study Conclusions  - Procedure narrative: Transthoracic echocardiography. Image   quality was adequate. The study was technically difficult, as a   result of poor acoustic windows. - Left ventricle: The cavity size was normal. Wall thickness was   normal. Systolic function was normal. The estimated ejection   fraction was in the range of 60% to 65%. Wall motion was normal;   there were no regional wall motion abnormalities. Features are   consistent with a pseudonormal left ventricular filling pattern,   with concomitant abnormal relaxation and increased filling   pressure (grade 2 diastolic dysfunction).    01/16/2018 PET scan    01/16/2018 PET Scan IMPRESSION: 1. Most striking finding is intense marrow activity diffusely throughout the axillary and appendicular skeleton. This is favored a physiologic marrow response from chronic anemia rather than diffuse metastatic disease. 2. Several discrete foci of more intense activity noted in the spine which could indicate malignancy or trauma. Difficult to define malignancy on the background of diffuse marrow activity. 3. Diffuse sclerosis throughout the bones similar to a recent CT scans but new from remote PET-CT scan 03/01/2016. 4. No evidence soft tissue metastasis.      INTERIM HISTORY: 01/17/17 Michaela Little is here for a follow up of her metastatic breast cancer.  I am taking over her care from Dr. Jana Hakim. This is my first time seeing her.   Today she is on her off week of Ibrance and is on 77m. She notes she feels exhausted and is often SOB. She has been having right sided abdominal pain that was worse 2 weeks ago, she say symptom management clinic and ER. She has been having ongoing pain in the right groin area but worsened lately. She is also having pain around her left lower ribs. She has not tried gabapentin and she takes tramadol as needed for pain. Right now her pain is tolerable. Sometime her pain can be past 10/10 but now the pain is 6-7/10 She is taking her BP medication for her HTN, she started Flovent due to her current trouble breathing, given by Dr. WMelvyn Novas Her legs swell but no doppler has been done before. She also gets a lot of fluid in her stomach. She is also on Lasix, given by her PCP, Dr. GAltamease Oiler She has arthritis. She still has tissue expander in left breast where she has some pain and tenderness. Her PCP changed her heart medication because her heart rate was slow when he last saw her. She would like to get to the bottom of her issues as she has difficulty falling asleep at  night.  She lives by herself in a community residence. She lives in [place] but still also have doctors and family in [place] as well. She has 5 children. She does not drive. She does have SCAT forms to be filled out.    CURRENT TREATMENT:    1. letrozole and Ibrance started March 2017   -Ibrance dose decreased to 75 mg daily, held on 06/03/2017 due to surgery, restarted around 09/19/17, stopped on 12/19/2017 due to severe pancytopenia    -Letrozole switched to Exemestane on 02/13/17 due to b/l rib pain.  2. Xgeva started 11/16/2015, repeated monthly   INTERVAL HISTORY:  Michaela Little is here for follow-up. She saw NP Lacie in the interim, and was noted to have a productive cough with emesis. She also had a PET scan recently. Today, she is here with Michaela Little, a church  friend. She was recently hospitalized at [facility] for severe anemia.  She is sad about being told that here is nothing that can be done for her and that she is dying during her hospital stay. She states that she was informed that she had a blood clot in her leg and received blood thinners during the hospital stay.  She states that cough is her major concern now, as it has affected her ability of keep food down.  She is remains to be extremely fatigued, uses a wheelchair, and needs assistance for her daily living.    REVIEW OF SYSTEMS:  Constitutional: Denies fevers, chills or abnormal weight loss  Eyes: Denies blurriness of vision Ears, nose, mouth, throat, and face: Denies mucositis, sore throat  Respiratory: (+) Cough with dyspnea Cardiovascular: Denies palpitation, no chest pain Gastrointestinal:  Denies nausea, heartburn  MSK: No new joint pain Breast: no lumps or skin changes Skin: Denies abnormal skin rashes Lymphatics: Denies new lymphadenopathy or easy bruising Neurological:Denies numbness, tingling or new weaknesses Behavioral/Psych: Mood is stable, no new changes  All other systems were reviewed with the patient and are negative.  MEDICAL HISTORY:  Past Medical History:  Diagnosis Date  . Anxiety   . Arthritis    hands, knees, hips  . Asthma   . Bronchitis   . Cancer ([place])   . Cervical stenosis (uterine cervix)   . Disorder of appendix    Enlarged  . Dyspnea on exertion   . GERD (gastroesophageal reflux disease)   . Headache(784.0)   . History of breast cancer    2010--  LEFT  s/p  mastectomy (in [place]) AND CHEMORADIATION--  NO RECURRENCE  . History of cervical dysplasia   . Hyperlipidemia   . Hypertension   . Malignant neoplasm of overlapping sites of left breast in female, estrogen receptor positive ([place]) 03/05/2013  . OSA (obstructive sleep apnea) moderate osa per study  09/2010   CPAP  , NOT USING ON REGULAR BASIS  . Pelvic pain in female   . Positive H. pylori test      08-05-2013  . Positive TB test    [place] TE[place]--  [place]/ MEDS  . Refusal of blood transfusions as patient is Jehovah's Witness   . Seasonal allergies   . Uterine fibroid   . Wears glasses     SURGICAL HISTORY: Past Surgical History:  Procedure Laterality Date  . APPENDECTOMY  y  . CARDIOVASCULAR STRESS TEST  10-29-2012   low risk perfusion study/  no significant reversibity/ ef 66%/  normal wall motion  . CERVICAL CONIZATION W/BX  2012   in  [place]  .  CHOLECYSTECTOMY N/A 05/14/2012   Procedure: LAPAROSCOPIC CHOLECYSTECTOMY;  Surgeon: Ralene Ok, MD;  Location: [facility];  Service: General;  Laterality: N/A;  . COLONOSCOPY  2011   normal per patient - [place]  . DILATION AND CURETTAGE OF UTERUS  10/15/2011   Procedure: DILATATION AND CURETTAGE;  Surgeon: Melina Schools, MD;  Location: [facility];  Service: Gynecology;  Laterality: N/A;  Conization &  endocervical curettings  . EXAMINATION UNDER ANESTHESIA N/A 08/20/2013   Procedure: EXAM UNDER ANESTHESIA;  Surgeon: Margarette Asal, MD;  Location: [facility];  Service: Gynecology;  Laterality: N/A;  . [place]   right  . LAPAROSCOPIC APPENDECTOMY N/A 08/22/2017   Procedure: [place];  Surgeon: Coralie Keens, MD;  Location: [facility];  Service: General;  Laterality: N/A;  . LAPAROSCOPIC ASSISTED VAGINAL HYSTERECTOMY N/A 10/26/2014   Procedure: HYSTERECTOMY ABDOMINAL ;  Surgeon: Molli Posey, MD;  Location: [facility];  Service: Gynecology;  Laterality: N/A;  . MASTECTOMY Left 11/2008  in [place]   . SALPINGOOPHORECTOMY Bilateral 10/26/2014   Procedure: BILATERAL SALPINGO OOPHORECTOMY;  Surgeon: Molli Posey, MD;  Location: [facility];  Service: Gynecology;  Laterality: Bilateral;  . TISSUE EXPANDER PLACEMENT Left 08/22/2017   Procedure: REMOVAL OF LEFT TISSUE EXPANDER;  Surgeon: Crissie Reese, MD;  Location: [facility];  Service: Plastics;  Laterality: Left;  . TISSUE EXPANDER REMOVAL Left  08/22/2017  . TRANSTHORACIC ECHOCARDIOGRAM  10-29-2012   mild lvh/  ef 60-65%/  grade II diastolic dysfunction/  trivial mr  &  tr    I have reviewed the social history and family history with the patient and they are unchanged from previous note.  ALLERGIES:  is allergic to lisinopril-hydrochlorothiazide; adhesive [tape]; fentanyl; gabapentin; hctz [hydrochlorothiazide]; latex; lisinopril; losartan potassium; other; oxycodone; and tums [calcium carbonate antacid].  MEDICATIONS:  Current Outpatient Medications  Medication Sig Dispense Refill  . acetaminophen (TYLENOL) 325 MG tablet Take by mouth.    . carbamide peroxide (DEBROX) 6.5 % OTIC solution Place 5 drops into both ears 2 (two) times daily. 15 mL 0  . carvedilol (COREG) 6.25 MG tablet Take 1 tablet (6.25 mg total) by mouth 2 (two) times daily with a meal. 180 tablet 3  . cefUROXime (CEFTIN) 500 MG tablet Take by mouth.    . cetirizine (ZYRTEC) 10 MG tablet Take 10 mg by mouth daily.  11  . chlorpheniramine-HYDROcodone (TUSSIONEX PENNKINETIC ER) 10-8 MG/5ML SUER Take 5 mLs by mouth every 12 (twelve) hours as needed for cough. 230 mL 0  . Dextromethorphan-guaiFENesin (MUCINEX DM MAXIMUM STRENGTH) 60-1200 MG TB12 Take 1 tablet by mouth 2 times daily at 12 noon and 4 pm. 28 each 0  . diphenhydrAMINE (BENADRYL) 25 mg capsule Take 1 capsule (25 mg total) by mouth every 6 (six) hours as needed for itching. 30 capsule 0  . exemestane (AROMASIN) 25 MG tablet Take 1 tablet (25 mg total) by mouth daily after breakfast. 90 tablet 1  . fluticasone furoate-vilanterol (BREO ELLIPTA) 100-25 MCG/INH AEPB Inhale 1 puff into the lungs daily. 28 each 5  . folic acid (FOLVITE) 1 MG tablet Take 1 tablet (1 mg total) by mouth daily. 30 tablet 0  . furosemide (LASIX) 20 MG tablet Take 1 tablet (20 mg total) by mouth daily as needed (for fluid retention.). 30 tablet 5  . HYDROcodone-homatropine (HYCODAN) 5-1.5 MG/5ML syrup Take 5 mLs by mouth every 6 (six)  hours as needed for cough. [place]  mL 0  . hydrOXYzine (ATARAX/VISTARIL) 10 MG tablet Take by mouth.    [place] Kitchen ipratropium-albuterol (DUONEB) 0.5-2.5 (3) MG/3ML SOLN Take 3 mLs by nebulization every 6 (six) hours as needed. (Patient taking differently: Take 3 mLs by nebulization every 6 (six) hours as needed (wheezing/shortness of breath). ) 360 mL 5  . montelukast (SINGULAIR) 10 MG tablet Take 1 tablet (10 mg total) by mouth at bedtime. 30 tablet 5  . pantoprazole (PROTONIX) 40 MG tablet Take 1 tablet (40 mg total) by mouth daily. Take 30-60 min before first meal of the day 30 tablet 2  . promethazine (PHENERGAN) 25 MG tablet Take 1 tablet (25 mg total) by mouth every 6 (six) hours as needed for nausea or vomiting. 30 tablet 0  . ranitidine (ZANTAC) 300 MG tablet Take 1 tablet (300 mg total) by mouth at bedtime. (Patient taking differently: Take 300 mg by mouth at bedtime as needed (for indigestion.). ) 30 tablet 5  . vitamin B-12 1000 MCG tablet Take 1 tablet (1,000 mcg total) by mouth daily. 30 tablet 0   No current facility-administered medications for this visit.    Facility-Administered Medications Ordered in Other Visits  Medication Dose Route Frequency Provider Last Rate Last Dose  . 0.9 %  sodium chloride infusion   Intravenous Once [name], MD        PHYSICAL EXAMINATION:  ECOG PERFORMANCE STATUS: 3 GENERAL:alert, no distress and comfortable (+)on wheelchair SKIN: appears to be pale  EYES: normal, Conjunctiva are pink and non-injected, sclera clear OROPHARYNX:no exudate, no erythema and lips, buccal mucosa, and tongue normal  NECK: supple, thyroid normal size, non-tender,  LYMPH:  no palpable lymphadenopathy in the cervical, axillary or inguinal LUNGS: clear to auscultation and percussion with normal breathing effort (+) on O2 canula (+) constant dry cough HEART: regular rate & rhythm and no murmurs and no lower extremity edema ABDOMEN:abdomen soft, non-tender and normal bowel sounds     Musculoskeletal:no cyanosis of digits and no clubbing  NEURO: alert & oriented x 3 with fluent speech, no focal motor/sensory deficits Breast: exam deferred    LABORATORY DATA:  I have reviewed the data as listed CBC Latest Ref Rng & Units 01/29/2018 01/15/2018 01/06/2018  WBC 4.0 - 10.5 K/uL 4.8 5.1 3.6(L)  Hemoglobin 12.0 - 15.0 g/dL 4.2(LL) 4.1(LL) 4.1(LL)  Hematocrit 36.0 - 46.0 % 14.6(L) 14.0(L) 13.8(L)  Platelets 150 - 400 K/uL 157 86(L) 95(L)     CMP Latest Ref Rng & Units 01/06/2018 01/01/2018 12/23/2017  Glucose 70 - 99 mg/dL 157(H) 133(H) 116(H)  BUN 6 - 20 mg/dL _0 Creatinine 0.44 - 1.00 mg/dL 0.71 0.68 0.82  Sodium 135 - 145 mmol/L 141 137 137  Potassium 3.5 - 5.1 mmol/L 3.7 3.5 4.0  Chloride 98 - 111 mmol/L 108 109 105  CO2 22 - 32 mmol/L 24 21(L) 25  Calcium 8.9 - 10.3 mg/dL 8.8(L) 8.9 9.0  Total Protein 6.5 - 8.1 g/dL 7.6 - 7.5  Total Bilirubin 0.3 - 1.2 mg/dL 2.3(H) - 2.6(H)  Alkaline Phos 38 - 126 U/L 132(H) - 136(H)  AST 15 - 41 U/L 38 - 27  ALT 0 - 44 U/L 17 - 17   Tumor Marker CA 27.29 01/06/18: 430.3  PATHOLOGY Diagnosis 08/22/17  1. Appendix, Other than Incidental - METASTATIC CARCINOMA, CONSISTENT WITH BREAST PRIMARY. - CARCINOMA IS PRESENT AT THE SURGICAL RESECTION MARGIN AS WELL AS THE SEROSAL SURFACE. - LYMPHOVASCULAR INVASION IS IDENTIFIED. - SEE COMMENT. 2. Breast,  capsule, Left - DENSE FIBROUS TISSUE WITH MILD INFLAMMATION, INCLUDING A FEW SCATTERED MULTINUCLEATED GIANT CELLS. - THERE IS NO EVIDENCE OF MALIGNANCY. Microscopic Comment 1. Grossly, the appendiceal wall is thickened. Histologic evaluation reveals diffuse involvement by a malignant process, consisting of small relatively uniform discohesive tumor cells involving the submucosa as well as the periappendiceal soft tissue, including the serosal surface. Immunohistochemical stains were performed revealing that the tumor cells are positive for cytokeratin 7, GATA-3, estrogen  receptor and focally positive for GCDFP. Tumor cells are negative for CD56, cytokeratin 20, chromogranin and synaptophysin. A Ki-67 stain is positive in approximately 70% of the tumor cells. Overall, the findings are consistent with metastatic carcinoma of breast primary and may be a lobular phenotype. A complete breast prognostic profile will be performed and the results reported separately. Dr. Vicente Males has reviewed selected slides and concurs with this interpretation. (JBK:kh 08-25-17)    PROCEDURES 10/07/2017 ECO Study Conclusions  - Procedure narrative: Transthoracic echocardiography. Image   quality was adequate. The study was technically difficult, as a   result of poor acoustic windows. - Left ventricle: The cavity size was normal. Wall thickness was   normal. Systolic function was normal. The estimated ejection   fraction was in the range of 60% to 65%. Wall motion was normal;   there were no regional wall motion abnormalities. Features are   consistent with a pseudonormal left ventricular filling pattern,   with concomitant abnormal relaxation and increased filling   pressure (grade 2 diastolic dysfunction).  ECHO 01/22/17 Study Conclusions - Left ventricle: The cavity size was normal. There was moderate concentric hypertrophy. Systolic function was normal. The estimated ejection fraction was in the range of 55% to 60%. Wall motion was normal; there were no regional wall motion abnormalities. Doppler parameters are consistent with abnormal left ventricular relaxation (grade 1 diastolic dysfunction).   RADIOGRAPHIC STUDIES: I have personally reviewed the radiological images as listed and agreed with the findings in the report. No results found.   01/16/2018 PET Scan IMPRESSION: 1. Most striking finding is intense marrow activity diffusely throughout the axillary and appendicular skeleton. This is favored a physiologic marrow response from chronic anemia rather than  diffuse metastatic disease. 2. Several discrete foci of more intense activity noted in the spine which could indicate malignancy or trauma. Difficult to define malignancy on the background of diffuse marrow activity. 3. Diffuse sclerosis throughout the bones similar to a recent CT scans but new from remote PET-CT scan 03/01/2016. 4. No evidence soft tissue metastasis.  CT AP W Contrast 08/27/17 IMPRESSION: No evidence of pulmonary embolus. Small pericardial effusion with uncertain clinical significance. No evidence of acute abnormalities within the chest, abdomen or pelvis. Area of fat stranding and small focus of gas within the anterior abdominal wall, immediately inferior to the umbilicus, probably at the site of laparoscopic access.   Whole Body Scan 08/12/17 IMPRESSION: Scattered degenerative type uptake as above with a questionable focus of increased tracer localization at L2 vertebral body versus artifact; no abnormality is seen at this site by CT. Consider either characterization by MR or attention on follow-up imaging.   CT AP W Contrast 08/12/17  IMPRESSION: Continued thickening of the appendix is noted without surrounding inflammation. It has maximum measured diameter is decreased compared to prior exam. There is no evidence of acute inflammation. Stable mixed lytic and sclerotic appearance of visualized skeleton is noted consistent with history of known osseous metastases. No acute abnormality seen in the abdomen or pelvis.  CT AP W Contrast 02/07/17 IMPRESSION: 1. No CT findings for abdominal/pelvic metastatic disease. 2. No acute abdominal findings, mass lesions or adenopathy. 3. Status post cholecystectomy with mild associated common bile duct dilatation. 4. Somewhat thickened appendix appears relatively stable. No acute inflammatory process. 5. Stable mixed lytic and sclerotic osseous metastatic disease.   Bone Scan Whole Body 02/07/17 IMPRESSION: No  scintigraphic evidence skeletal metastasis    ASSESSMENT & PLAN:  Michaela Little is a 57 y.o. female with a history of HTN, HLD, Arthritis, Asthma, Sleep Apnea, and H. Pylori.  1. Severe pancytopenia, likely secondary to Ibrance, vs metastatic breast cancer to bones  -She was found to have ANC 0.7, hemoglobin 4.7, platelet 48 on routine follow-up on December 19, 2017.  Lab work-up was negative for hemolysis, or iron deficiency, stool OB was negative. -This is likely related to [place], which I have stopped on December 19, 2017. 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 -I have started Aranesp injection every 2 weeks (changed to weekly afterwards), but her Hg remains to be around 4, no significant response to Aranesp -Her neutropenia and thrombocytopenia has recovered, but her anemia has not.  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  Bone marrow biopsy was attempted when she was hospitalized at [facility], but failed. -she is very somatic from her anemia.  She knows to avoid exertion.    2. 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 -She has metastatic ER positive breast cancer to bones, has been on letrozole and Ibrance since March 2017. She has had her oncological care both in [place] and [place], she had a PET scan in December 2017 and  CT scan in April 2018. Due to her worsening bilateral rib cage pain, I obtained restaging CT abdomen and pelvis with contrast, and bone scan on 02/07/2017. I previously reviewed the scan findings with pt, which showed stable lipomatosis, not hypermetabolic on bone scan, no other new lesions.  No evidence of disease progression on the restaging scan -Given her increase b/l rib cage pain, possible related to her letrozole along with her bone mets, I switched her to  Exemestane (02/13/17) and her bone pain improved.  -She underwent appendectomy by Dr. Ninfa Linden and breast reconstruction to remove her breast implant by Dr. Harlow Mares on 08/22/17. The pathology from her surgery showed evidence of metastatic breast cancer in her appendix, ER positive/PR and HER2 negative. I recommend she continue current regimen. She can restart Ibrance once she recovers from surgery .  -I previously discussed her goal of therapy is palliative and she will continue on this for as long as she can tolerate of her disease progresses.  -Due to her severe pancytopenia, [name] was stopped on December 19, 2017, due to her persistent severe anemia, I highly suspect she has diffuse bone mets now -Her PET scan from January 16, 2018 showed diffuse marrow activity, possible related to Aranesp injection, although diffuse bone metastasis is also likely, given her persistent severe anemia. -Continue exemestane -Patient is not ready for hospice.  We discussed treatment options, due to her severe anemia, she is not a candidate for other CDK4/6 inhibitor.  I discussed the option of chemotherapy if she agrees with blood transfusion.  She is not interested in chemo, and continue to decline blood transfusion.  I will check foundation One, which  includes PI3KCA mutation in her tumor to see if she is a candidate for Piqray which carries 2% risk of anemia, or other targeted therapy,      3. Primary breast cancer of left breast overlapping sites diagnosed March of 2010 and treated neo-adjuvantly at [facility] with (a) paclitaxel weekly x12 and (b) doxorubicin/ cyclophosphamide in dose dense fashion x4, both given with tifiparnib.  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  On anastrozole since May 2012 with good tolerance. Held between November and December 2013 due to GI issues, unrelated to cancer or its treatment. Discontinued by the patient summer of 2016. No on Exemestane.  4. Bilateral rib cage pain, Left chest wall pain  -She takes Tylenol and tramadol as needed.  -Bone pain previously increased to bilateral ribs. I have switched her letrozole to Exemestane. Her pain has improved. -She previously declined narcotics, but I suggested she continue to take tylenol, Advil or aleve for her pain as we want to help her control her pain.  -I refilled her Tramadol previously, she had left chest wall pain after the implant was removed  -she did PT   5. Lower Extremity Edema -She is on Lasix, given by her PCP Dr. Altamease Oiler -I previously suggested compression socks and advices her to elevate her feet when sitting at home.  -Repeat echocardiogram in 12/2016 and 09/2017 shows normal EF  6. Dyspnea, Cough -Uses Flovent -she will f/u with pulmonologist Dr. Melvyn Novas   -worse lately due to her severe anemia  -She states that cough is her major concern now.  -I will prescribe cough syrup hycodan today and refer her to our financial office to apply for the grant to cover the medicine  7. Insomnia  -She is on hydroxyzine already  -I suggested her to try melatonin previously -She was given gabapentin in the past, I previously advised if it is not working for her she can stop.   8. 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 Ninfa Linden on 08/22/17 which was found to be metastasis of her breast cancer.   9. Goal of care discussion  -We discussed the incurable nature of her cancer, and the overall poor prognosis, especially if she does not have good response to chemotherapy or progress on chemo -The  patient understands the goal of care is palliative. -she is full code now -She met the hospice team at home, not ready for hospice.  She is agreeable with palliative care at home, I will refer  10, right LE DVT -This was found during her hospital stay at [facility].  This is probably related to her immobility, metastatic cancer and adnexal injection -She was prescribed Lovenox 80 mg twice daily.  Due to concern of bleeding and worsening anemia, she has not started at home. -I encouraged her to start Lovenox 80 mg daily, to prevent worsening DVT or PE. The risk of bleeding is much smaller on low dose. She agrees  PLAN: -Aranesp injection 300 mcg toady and every week  -lab in one week  -f/u in 2 weeks -I prescribed Hycodan cough syrup -she will take lovenox 69m daily  -will check her tumor  for foundation One (including PIK3CA mutation) -palliative care referral    No orders of the defined types were placed in this encounter.  All questions were answered. The patient knows to call the clinic with any problems, questions or concerns. No barriers to learning was detected.  I spent 30 minutes counseling the patient face to face. The total time spent in the appointment was 40 minutes and more than 50% was on counseling and review of test results  I, Noor Dweik am acting as scribe for Dr. [name].  I have reviewed the above documentation for accuracy and completeness, and I agree with the above.     [name], MD 01/29/2018 4:54 PM

## 2018-01-29 NOTE — Telephone Encounter (Signed)
Phone call placed to patient to introduce Palliative care and to schedule visit with NP. Scheduled for 01/31/18

## 2018-01-29 NOTE — Progress Notes (Signed)
Patient to receive Aranesp only today, without labs today, per Dr. Burr Medico.

## 2018-01-29 NOTE — Telephone Encounter (Signed)
Appts scheduled avs/calendar printed per 11/6 los °

## 2018-01-29 NOTE — Progress Notes (Signed)
Met w/ pt and introduced myself as her Arboriculturist.  Pt needed assistance w/ medication that's not covered under her ins.  I informed her of the [employer], went over what it covers and gave her an expense sheet.  Pt would like to apply so she will provide her proof of income on 02/05/18.  She has my card for any questions or concerns she may have in the future.

## 2018-01-31 ENCOUNTER — Other Ambulatory Visit: Payer: Medicare Other | Admitting: Primary Care

## 2018-01-31 DIAGNOSIS — I5189 Other ill-defined heart diseases: Secondary | ICD-10-CM

## 2018-01-31 DIAGNOSIS — I519 Heart disease, unspecified: Secondary | ICD-10-CM

## 2018-01-31 DIAGNOSIS — IMO0001 Reserved for inherently not codable concepts without codable children: Secondary | ICD-10-CM

## 2018-01-31 DIAGNOSIS — Z531 Procedure and treatment not carried out because of patient's decision for reasons of belief and group pressure: Secondary | ICD-10-CM

## 2018-01-31 DIAGNOSIS — C7951 Secondary malignant neoplasm of bone: Secondary | ICD-10-CM

## 2018-01-31 DIAGNOSIS — Z515 Encounter for palliative care: Secondary | ICD-10-CM

## 2018-01-31 DIAGNOSIS — C50912 Malignant neoplasm of unspecified site of left female breast: Secondary | ICD-10-CM

## 2018-01-31 NOTE — Progress Notes (Signed)
PALLIATIVE CARE CONSULT VISIT   PATIENT NAME: Michaela Little DOB: Oct 04, 1960 MRN: [id number]  PRIMARY CARE PROVIDER:   Clent Demark, PA-C  REFERRING PROVIDER:  Clent Demark, PA-C [place], [place] [address]  RESPONSIBLE PARTY:   self  ASSESSMENT and RECOMMENDATIONS:   1.Goals of Care: Pt desires full code at this time. 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 She is aware her hgb is 4.2 and that she will decline without transfusions. She does not want these she states even if it was not part of her faith.  Discussed hospice services. Patient initially felt it was giving up which she didn't want to do. We discussed the scope of support hospice can provide when there are not any more beneficial treatments for her cancer. She voices understanding and will consider hospice for later.   2. Dyspnea: Continue use of oxygen (now on 2 L) and energy saving measures. A reacher could help her conserve energy. Recent URI and low hgb have produced a chronic cough with dyspnea.   3. Endurance: Needs PCS but Medicaid has disallowed due to her medical instability. Again addressed services hospice can provide in personal care assistance. Has DME in home [facility] bed and w/c) to help with bed mobility and ambulation.  Patient was open to my continuing to see her to transition to hospice when she is ready, time TBD. Follow up 1-2 weeks.  I spent 45 minutes providing this consultation,  from 13:45  to 14:30. More than 50% of the time in this consultation was spent coordinating communication.   HISTORY OF PRESENT ILLNESS:  Michaela Little is a 57 y.o. year old female with multiple medical problems including breast cancer, obesity, HTN, HDL, OSA. Palliative Care was asked to help address goals of care.   CODE STATUS: FULL  PPS: 50% HOSPICE ELIGIBILITY/DIAGNOSIS: TBD- meets criteria but choosing to continue some curative treatments at this time.  PAST MEDICAL  HISTORY:  Past Medical History:  Diagnosis Date  . Anxiety   . Arthritis    hands, knees, hips  . Asthma   . Bronchitis   . Cancer ([place])   . Cervical stenosis (uterine cervix)   . Disorder of appendix    Enlarged  . Dyspnea on exertion   . GERD (gastroesophageal reflux disease)   . Headache(784.0)   . History of breast cancer    2010--  LEFT  s/p  mastectomy (in [place]) AND CHEMORADIATION--  NO RECURRENCE  . History of cervical dysplasia   . Hyperlipidemia   . Hypertension   . Malignant neoplasm of overlapping sites of left breast in female, estrogen receptor positive ([place]) 03/05/2013  . OSA (obstructive sleep apnea) moderate osa per study  09/2010   CPAP  , NOT USING ON REGULAR BASIS  . Pelvic pain in female   . Positive H. pylori test    08-05-2013  . Positive TB test    AS TEEN--  [place] W/ MEDS  . Refusal of blood transfusions as patient is Jehovah's Witness   . Seasonal allergies   . Uterine fibroid   . Wears glasses     SOCIAL HX:  Social History   Tobacco Use  . Smoking status: Former Smoker    Packs/day: 0.30    Years: 10.00    Pack years: 3.00    Types: Cigarettes    Last attempt to quit: 03/26/1988    Years since quitting: 29.8  .  Smokeless tobacco: Never Used  Substance Use Topics  . Alcohol use: No    ALLERGIES:  Allergies  Allergen Reactions  . Lisinopril-Hydrochlorothiazide Itching  . Adhesive [Tape] Itching and Other (See Comments)    Redness  . Fentanyl Other (See Comments)    GI upset and drowsiness  . Gabapentin Other (See Comments)    Auditory hallucinations  . Hctz [Hydrochlorothiazide] Itching  . Latex Itching  . Lisinopril Itching  . Losartan Potassium Other (See Comments)    Makes her feel "bad"   . Other Other (See Comments)    Patient refuses blood for religious reasons (per her notes)  . Oxycodone     GI upset, drowsy  . Tums [Calcium Carbonate Antacid] Hives    FRUIT-FLAVORED ONES     PERTINENT MEDICATIONS:  Outpatient  Encounter Medications as of 01/31/2018  Medication Sig  . acetaminophen (TYLENOL) 325 MG tablet Take by mouth.  . carbamide peroxide (DEBROX) 6.5 % OTIC solution Place 5 drops into both ears 2 (two) times daily.  . carvedilol (COREG) 6.25 MG tablet Take 1 tablet (6.25 mg total) by mouth 2 (two) times daily with a meal.  . cetirizine (ZYRTEC) 10 MG tablet Take 10 mg by mouth daily.  . chlorpheniramine-HYDROcodone (TUSSIONEX PENNKINETIC ER) 10-8 MG/5ML SUER Take 5 mLs by mouth every 12 (twelve) hours as needed for cough.  . Dextromethorphan-guaiFENesin (MUCINEX DM MAXIMUM STRENGTH) 60-1200 MG TB12 Take 1 tablet by mouth 2 times daily at 12 noon and 4 pm.  . diphenhydrAMINE (BENADRYL) 25 mg capsule Take 1 capsule (25 mg total) by mouth every 6 (six) hours as needed for itching.  Marland Kitchen exemestane (AROMASIN) 25 MG tablet Take 1 tablet (25 mg total) by mouth daily after breakfast.  . fluticasone furoate-vilanterol (BREO ELLIPTA) 100-25 MCG/INH AEPB Inhale 1 puff into the lungs daily.  . folic acid (FOLVITE) 1 MG tablet Take 1 tablet (1 mg total) by mouth daily.  . furosemide (LASIX) 20 MG tablet Take 1 tablet (20 mg total) by mouth daily as needed (for fluid retention.).  Marland Kitchen HYDROcodone-homatropine (HYCODAN) 5-1.5 MG/5ML syrup Take 5 mLs by mouth every 6 (six) hours as needed for cough.  . hydrOXYzine (ATARAX/VISTARIL) 10 MG tablet Take by mouth.  Marland Kitchen ipratropium-albuterol (DUONEB) 0.5-2.5 (3) MG/3ML SOLN Take 3 mLs by nebulization every 6 (six) hours as needed. (Patient taking differently: Take 3 mLs by nebulization every 6 (six) hours as needed (wheezing/shortness of breath). )  . montelukast (SINGULAIR) 10 MG tablet Take 1 tablet (10 mg total) by mouth at bedtime.  . pantoprazole (PROTONIX) 40 MG tablet Take 1 tablet (40 mg total) by mouth daily. Take 30-60 min before first meal of the day  . promethazine (PHENERGAN) 25 MG tablet Take 1 tablet (25 mg total) by mouth every 6 (six) hours as needed for nausea or  vomiting.  . ranitidine (ZANTAC) 300 MG tablet Take 1 tablet (300 mg total) by mouth at bedtime. (Patient taking differently: Take 300 mg by mouth at bedtime as needed (for indigestion.). )  . vitamin B-12 1000 MCG tablet Take 1 tablet (1,000 mcg total) by mouth daily.   Facility-Administered Encounter Medications as of 01/31/2018  Medication  . 0.9 %  sodium chloride infusion    PHYSICAL EXAM:  VS 98.1-72-18 150/51 PO 99% on 2 L.  General: NAD, WNWD appearing, obese. Occ. Pain 2/10. Relieved with OTC. Cardiovascular: regular rate and rhythm, S1S2 Pulmonary: clear all fields, dry cough. DOE. Abdomen: soft, nontender, + bowel sounds, endorses  constipation  GU: no suprapubic tenderness, endorses stress incontinence Extremities: no edema, no joint deformities Skin: no rashes,  No wounds.  Neurological: Weakness, sleep good. A& O x 4.   Cyndia Skeeters DNP, AGPCNP-BC

## 2018-02-05 ENCOUNTER — Inpatient Hospital Stay (HOSPITAL_BASED_OUTPATIENT_CLINIC_OR_DEPARTMENT_OTHER): Payer: Medicare Other | Admitting: Medical

## 2018-02-05 ENCOUNTER — Inpatient Hospital Stay: Payer: Medicare Other

## 2018-02-05 ENCOUNTER — Other Ambulatory Visit: Payer: Self-pay | Admitting: Emergency Medicine

## 2018-02-05 ENCOUNTER — Telehealth: Payer: Self-pay | Admitting: *Deleted

## 2018-02-05 VITALS — BP 146/62 | HR 89 | Temp 98.2°F | Resp 19 | Ht 67.0 in | Wt 246.2 lb

## 2018-02-05 VITALS — BP 132/56 | HR 85 | Temp 97.7°F | Resp 18

## 2018-02-05 DIAGNOSIS — C50812 Malignant neoplasm of overlapping sites of left female breast: Secondary | ICD-10-CM

## 2018-02-05 DIAGNOSIS — R5383 Other fatigue: Secondary | ICD-10-CM

## 2018-02-05 DIAGNOSIS — D500 Iron deficiency anemia secondary to blood loss (chronic): Secondary | ICD-10-CM

## 2018-02-05 DIAGNOSIS — C50912 Malignant neoplasm of unspecified site of left female breast: Secondary | ICD-10-CM | POA: Diagnosis not present

## 2018-02-05 DIAGNOSIS — K5900 Constipation, unspecified: Secondary | ICD-10-CM

## 2018-02-05 DIAGNOSIS — Z170 Estrogen receptor positive status [ER+]: Secondary | ICD-10-CM

## 2018-02-05 DIAGNOSIS — K922 Gastrointestinal hemorrhage, unspecified: Secondary | ICD-10-CM | POA: Diagnosis not present

## 2018-02-05 DIAGNOSIS — C7951 Secondary malignant neoplasm of bone: Secondary | ICD-10-CM

## 2018-02-05 DIAGNOSIS — D649 Anemia, unspecified: Secondary | ICD-10-CM

## 2018-02-05 DIAGNOSIS — I82409 Acute embolism and thrombosis of unspecified deep veins of unspecified lower extremity: Secondary | ICD-10-CM

## 2018-02-05 LAB — CBC WITH DIFFERENTIAL/PLATELET
Abs Immature Granulocytes: 0.27 10*3/uL — ABNORMAL HIGH (ref 0.00–0.07)
BASOS PCT: 1 %
Basophils Absolute: 0 10*3/uL (ref 0.0–0.1)
Eosinophils Absolute: 0.1 10*3/uL (ref 0.0–0.5)
Eosinophils Relative: 3 %
HCT: 17.4 % — ABNORMAL LOW (ref 36.0–46.0)
Hemoglobin: 4.9 g/dL — CL (ref 12.0–15.0)
IMMATURE GRANULOCYTES: 6 %
LYMPHS ABS: 1.5 10*3/uL (ref 0.7–4.0)
LYMPHS PCT: 35 %
MCH: 29.2 pg (ref 26.0–34.0)
MCHC: 28.2 g/dL — ABNORMAL LOW (ref 30.0–36.0)
MCV: 103.6 fL — AB (ref 80.0–100.0)
MONOS PCT: 8 %
Monocytes Absolute: 0.4 10*3/uL (ref 0.1–1.0)
NEUTROS ABS: 2 10*3/uL (ref 1.7–7.7)
NEUTROS PCT: 47 %
Platelets: 147 10*3/uL — ABNORMAL LOW (ref 150–400)
RBC: 1.68 MIL/uL — ABNORMAL LOW (ref 3.87–5.11)
RDW: 21.2 % — ABNORMAL HIGH (ref 11.5–15.5)
WBC: 4.3 10*3/uL (ref 4.0–10.5)
nRBC: 0 % (ref 0.0–0.2)
nRBC: 15 /100 WBC — ABNORMAL HIGH

## 2018-02-05 MED ORDER — DARBEPOETIN ALFA 300 MCG/0.6ML IJ SOSY
PREFILLED_SYRINGE | INTRAMUSCULAR | Status: AC
  Filled 2018-02-05: qty 0.6

## 2018-02-05 MED ORDER — DARBEPOETIN ALFA 300 MCG/0.6ML IJ SOSY
300.0000 ug | PREFILLED_SYRINGE | Freq: Once | INTRAMUSCULAR | Status: AC
  Administered 2018-02-05: 300 ug via SUBCUTANEOUS

## 2018-02-05 NOTE — Telephone Encounter (Signed)
Received call from Amy RN reporting hgb 4.9.  Reported to Dr Burr Medico & she reports this is better than last time & pt is Jehovah's Witness & won't take blood.  OK to give Aranesp.  Flush RN notified.

## 2018-02-05 NOTE — Patient Instructions (Signed)
Constipation Management  Magnesium Citrate, drink 1/2 bottle, drink remainder if no bowel movement with 30 to 60 minutes  Or  30 mg (1 tablespoon) of Milk of Magnesia in 8 ounces of prune juice, warm in microwave for 20 seconds    Begin the following after you have had a bowel movement:  Senna-S, 1 to 2 tablets twice daily  MiraLAX 17 grams in 8 ounces of liquids 1 to 2 times daily as needed   Remember to remain well hydrated. Drink, Drink, Drink non-caffeinated beverages.   Adjust these medications based on your response. If your bowel movements become too loose then decrease the amount of Senna-S and/or MiraLAX that you are using. If your bowel movements become too firm or are difficult to pass, the increase the amount of Senna-S and/or MiraLAX that you are using and increase your intake of water.  

## 2018-02-05 NOTE — Progress Notes (Signed)
Pt stated that blood was in stool this a.m. She does not know if it was from her straining or hemorrhoids. Stated she had blood in her stool a few months ago. Called Dr. Burr Medico desk nurse. Pt is now being seen in symptom management. Chemika Nightengale LPN

## 2018-02-07 ENCOUNTER — Other Ambulatory Visit: Payer: Medicare Other | Admitting: Primary Care

## 2018-02-07 DIAGNOSIS — D649 Anemia, unspecified: Secondary | ICD-10-CM

## 2018-02-07 DIAGNOSIS — C50912 Malignant neoplasm of unspecified site of left female breast: Secondary | ICD-10-CM

## 2018-02-07 DIAGNOSIS — Z515 Encounter for palliative care: Secondary | ICD-10-CM

## 2018-02-07 DIAGNOSIS — C7951 Secondary malignant neoplasm of bone: Secondary | ICD-10-CM

## 2018-02-07 NOTE — Progress Notes (Signed)
[facility] Telephone: [phone] Fax: [phone]  PATIENT NAME: PAMULA LUTHER DOB: [birth year] MRN: [id number]  PRIMARY CARE PROVIDER:   Clent Demark, PA-C  REFERRING PROVIDER:  Clent Demark, PA-C [place], [place] [id number]  RESPONSIBLE PARTY: Self    ASSESSMENT and RECOMMENDATIONS:  1. Pain:  Taking Aleve, counseled to alternate with tylenol or take that exclusively, and to use prescribed tramadol. Has generalized pain.  Will try tramadol tonight.  2. Dyspnea: Continue use of oxygen (now on 2 L) and energy saving measures. States she wants to wean but encouraged not to do this due to her hgb level.   3. Function: will have SW address caregiving issues. Losing stamina and ability to care for herself. Has 5 children, not apparent if they are local. Has large church network. Encouraged to reach out to them for assistance with adls and I adls. She is reticent to but will need care provision as disease course progresses. Has been denied medicaid for PCS.   4.Goals of Care:  States she'll have no more chemo due to low hgb. Has had a small increase  In hgb this week. States not ready to make end of life decisions. Wants to return to hospital If needed.  States She needs to name a POA. Encouraged to address this soon.   Revisit in 2 weeks to continue supporting goals of care decisions and hospice referral.  I spent 25 minutes providing this consultation,  From 11:35 am  to 12:00. More than 50% of the time in this consultation was spent coordinating communication.   HISTORY OF PRESENT ILLNESS:  ZYAIR RHEIN is a 57 y.o. year old female with multiple medical problems includingbreast cancer, obesity, HTN, HDL, OSA . Palliative Care was asked to help address goals of care.   CODE STATUS: FULL  PPS: 40% HOSPICE ELIGIBILITY/DIAGNOSIS: yes but patient is not ready to elect.  PAST MEDICAL HISTORY:  Past Medical History:  Diagnosis Date  .  Anxiety   . Arthritis    hands, knees, hips  . Asthma   . Bronchitis   . Cancer ([place])   . Cervical stenosis (uterine cervix)   . Disorder of appendix    Enlarged  . Dyspnea on exertion   . GERD (gastroesophageal reflux disease)   . Headache(784.0)   . History of breast cancer    2010--  LEFT  s/p  mastectomy (in [place]) AND CHEMORADIATION--  NO RECURRENCE  . History of cervical dysplasia   . Hyperlipidemia   . Hypertension   . Malignant neoplasm of overlapping sites of left breast in female, estrogen receptor positive ([place]) 03/05/2013  . OSA (obstructive sleep apnea) moderate osa per study  09/2010   CPAP  , NOT USING ON REGULAR BASIS  . Pelvic pain in female   . Positive H. pylori test    08-05-2013  . Positive TB test    [place] TEEN--  [place]/ MED[place]  . Refusal of blood transfusions as patient is Jehovah's Witness   . Seasonal allergies   . Uterine fibroid   . Wears glasses     SOCIAL HX:  Social History   Tobacco Use  . Smoking status: Former Smoker    Packs/day: 0.30    Years: 10.00    Pack years: 3.00    Types: Cigarettes    Last attempt to quit: 03/26/1988    Years since quitting: 29.8  . Smokeless tobacco: Never Used  Substance Use  Topics  . Alcohol use: No    ALLERGIES:  Allergies  Allergen Reactions  . Lisinopril-Hydrochlorothiazide Itching  . Adhesive [Tape] Itching and Other (See Comments)    Redness  . Fentanyl Other (See Comments)    GI upset and drowsiness  . Gabapentin Other (See Comments)    Auditory hallucinations  . Hctz [Hydrochlorothiazide] Itching  . Latex Itching  . Lisinopril Itching  . Losartan Potassium Other (See Comments)    Makes her feel "bad"   . Other Other (See Comments)    Patient refuses blood for religious reasons (per her notes)  . Oxycodone     GI upset, drowsy  . Tums [Calcium Carbonate Antacid] Hives    FRUIT-FLAVORED ONES     PERTINENT MEDICATIONS:  Outpatient Encounter Medications as of 02/07/2018  Medication Sig  .  carbamide peroxide (DEBROX) 6.5 % OTIC solution Place 5 drops into both ears 2 (two) times daily.  . carvedilol (COREG) 6.25 MG tablet Take 1 tablet (6.25 mg total) by mouth 2 (two) times daily with a meal.  . cetirizine (ZYRTEC) 10 MG tablet Take 10 mg by mouth daily.  . chlorpheniramine-HYDROcodone (TUSSIONEX PENNKINETIC ER) 10-8 MG/5ML SUER Take 5 mLs by mouth every 12 (twelve) hours as needed for cough.  . Dextromethorphan-guaiFENesin (MUCINEX DM MAXIMUM STRENGTH) 60-1200 MG TB12 Take 1 tablet by mouth 2 times daily at 12 noon and 4 pm.  . diphenhydrAMINE (BENADRYL) 25 mg capsule Take 1 capsule (25 mg total) by mouth every 6 (six) hours as needed for itching.  Marland Kitchen exemestane (AROMASIN) 25 MG tablet Take 1 tablet (25 mg total) by mouth daily after breakfast.  . fluticasone furoate-vilanterol (BREO ELLIPTA) 100-25 MCG/INH AEPB Inhale 1 puff into the lungs daily.  . folic acid (FOLVITE) 1 MG tablet Take 1 tablet (1 mg total) by mouth daily.  . furosemide (LASIX) 20 MG tablet Take 1 tablet (20 mg total) by mouth daily as needed (for fluid retention.).  Marland Kitchen HYDROcodone-homatropine (HYCODAN) 5-1.5 MG/5ML syrup Take 5 mLs by mouth every 6 (six) hours as needed for cough.  Marland Kitchen ipratropium-albuterol (DUONEB) 0.5-2.5 (3) MG/3ML SOLN Take 3 mLs by nebulization every 6 (six) hours as needed. (Patient taking differently: Take 3 mLs by nebulization every 6 (six) hours as needed (wheezing/shortness of breath). )  . montelukast (SINGULAIR) 10 MG tablet Take 1 tablet (10 mg total) by mouth at bedtime.  . pantoprazole (PROTONIX) 40 MG tablet Take 1 tablet (40 mg total) by mouth daily. Take 30-60 min before first meal of the day  . promethazine (PHENERGAN) 25 MG tablet Take 1 tablet (25 mg total) by mouth every 6 (six) hours as needed for nausea or vomiting.  . ranitidine (ZANTAC) 300 MG tablet Take 1 tablet (300 mg total) by mouth at bedtime. (Patient taking differently: Take 300 mg by mouth at bedtime as needed (for  indigestion.). )  . vitamin B-12 1000 MCG tablet Take 1 tablet (1,000 mcg total) by mouth daily.   Facility-Administered Encounter Medications as of 02/07/2018  Medication  . 0.9 %  sodium chloride infusion    PHYSICAL EXAM:   VS 97.8-83-18 138/79  99% on 2 L  General: NAD, frail appearing, obese Cardiovascular: regular rate and rhythm, S1 S2, bounding  Pulmonary: clear all  Fields. Oxygen at 2 L. Abdomen: soft, nontender, + bowel sounds. Endorsed some blood in stool and constipation. Extremities: no bil LE edema, no joint deformities, Skin: no rashes no wounds on gross exam. Neurological: Weakness but otherwise nonfocal  Phill Myron. Jackey Loge, AGPCNP-BC

## 2018-02-10 ENCOUNTER — Ambulatory Visit: Payer: Medicare Other

## 2018-02-10 NOTE — Progress Notes (Signed)
[facility] Progress Note   Michaela Little [id number] [birth year] 57 y.o.  Michaela Little is managed by Dr. 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  She was instructed to use Senna-S, 1-2 p.o. twice daily.  She was also instructed that she could use MiraLAX 1-2 times daily as needed.  She was told to hold Lovenox until her GI bleeding stops.  She can then restart it once daily.  A CBC returned today with a hemoglobin of 4.9.  She was dosed with Aranesp today.  The patient is Jehovah's Witness and declines blood transfusions.  Metastatic carcinoma of the breast: Patient continues to be followed by Dr. Burr Medico and is actively treated with Exemestane and Xgeva.  Please see After Visit Summary for patient specific instructions.  Future Appointments  Date Time Provider [place]  02/11/2018 11:15 AM [facility] 2 [facility] None  02/11/2018 11:30 AM Alla Feeling, NP [facility] None  03/04/2018 10:50 AM Clent Demark, PA-C [facility] None  01/14/2019 11:30 AM [name], MD [facility] None    No orders of the defined types were placed in this encounter.      Subjective:   Patient ID:  Michaela Little is a 57 y.o. (DOB [birth year]) female.  Chief Complaint:  Chief Complaint  Patient presents with  . Rectal Bleeding    HPI Michaela Little is a 57 year old female with a history of a metastatic breast cancer who is managed by Dr. [name].  The patient is currently treated with exemestane and Xgeva.  She also has a history of anemia for which she receives urinalysis regularly.  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  She continues on Lovenox for a DVT.  She has had recent constipation and has had to strain to achieve a bowel movement.  She has noted bright red blood per rectum secondary to her profound constipation.  A CBC was collected today with a hemoglobin returning at 4.9.  She is having fatigue and orthostasis.  Medications: I have reviewed the patient's current medications.  Allergies:  Allergies  Allergen Reactions  . Lisinopril-Hydrochlorothiazide Itching  . Adhesive [Tape] Itching and Other (See Comments)    Redness  . Fentanyl Other (See Comments)    GI upset and drowsiness  . Gabapentin Other (See Comments)    Auditory hallucinations  . Hctz [Hydrochlorothiazide] Itching  . Latex Itching  . Lisinopril Itching  . Losartan Potassium Other (See Comments)    Makes her feel "bad"   . Other Other (See Comments)    Patient refuses blood for religious reasons (per her notes)  . Oxycodone     GI upset, drowsy  . Tums [Calcium Carbonate Antacid] Hives    FRUIT-FLAVORED ONES    Past Medical History:  Diagnosis Date  . Anxiety   . Arthritis    hands, knees, hips  . Asthma   . Bronchitis   . Cancer ([place])   . Cervical stenosis (uterine cervix)   . Disorder of appendix    Enlarged  . Dyspnea on exertion   . GERD (gastroesophageal reflux disease)   . Headache(784.0)   . History of breast cancer    2010--  LEFT  s/p  mastectomy (in [place]) AND CHEMORADIATION--  NO RECURRENCE  . History of cervical dysplasia   . Hyperlipidemia   . Hypertension   . Malignant neoplasm of overlapping sites of left breast in female, estrogen receptor positive ([place]) 03/05/2013  . OSA (obstructive sleep apnea) moderate osa per study  09/2010   CPAP  , NOT USING ON REGULAR BASIS  . Pelvic pain in female   . Positive H. pylori test    08-05-2013  . Positive TB test    AS TEEN--  [place]/ MEDS  . Refusal of blood transfusions as patient is Jehovah's Witness   . Seasonal allergies     . Uterine fibroid   . Wears glasses     Past Surgical History:  Procedure Laterality Date  . APPENDECTOMY  y  . CARDIOVASCULAR STRESS TEST  10-29-2012   low risk perfusion study/  no significant reversibity/ ef 66%/  normal wall motion  . CERVICAL CONIZATION W/BX  2012   in  [place] N/A 05/14/2012   Procedure: LAPAROSCOPIC CHOLECYSTECTOMY;  Surgeon: Ralene Ok, MD;  Location: [facility];  Service: General;  Laterality: N/A;  . COLONOSCOPY  2011   normal per patient - [place]  . DILATION AND CURETTAGE OF UTERUS  10/15/2011   Procedure: DILATATION AND CURETTAGE;  Surgeon: Melina Schools, MD;  Location: [facility];  Service: Gynecology;  Laterality: N/A;  Conization &  endocervical curettings  . EXAMINATION UNDER ANESTHESIA N/A 08/20/2013   Procedure: EXAM UNDER ANESTHESIA;  Surgeon: Margarette Asal, MD;  Location: [facility];  Service: Gynecology;  Laterality: N/A;  . [place]   right  . LAPAROSCOPIC APPENDECTOMY N/A 08/22/2017   Procedure: [place];  Surgeon: Coralie Keens, MD;  Location: [place];  Service: General;  Laterality: N/A;  . LAPAROSCOPIC ASSISTED VAGINAL HYSTERECTOMY N/A 10/26/2014   Procedure: HYSTERECTOMY ABDOMINAL ;  Surgeon: Molli Posey, MD;  Location: [facility];  Service: Gynecology;  Laterality: N/A;  . MASTECTOMY Left 11/2008  in [place]   . SALPINGOOPHORECTOMY Bilateral 10/26/2014   Procedure: BILATERAL SALPINGO OOPHORECTOMY;  Surgeon: Molli Posey, MD;  Location: [facility];  Service: Gynecology;  Laterality: Bilateral;  . TISSUE EXPANDER PLACEMENT Left 08/22/2017   Procedure: REMOVAL OF LEFT TISSUE EXPANDER;  Surgeon: Crissie Reese, MD;  Location: [place];  Service: Plastics;  Laterality: Left;  . TISSUE EXPANDER REMOVAL Left 08/22/2017  . TRANSTHORACIC ECHOCARDIOGRAM  10-29-2012   mild lvh/  ef 60-65%/  grade II diastolic dysfunction/  trivial mr  &  tr    Family History  Problem Relation  Age of Onset  . Breast cancer Mother   . Colon cancer Mother   . Hypotension Mother   . Asthma Mother   . Diabetes type II Mother   . Arthritis Mother   . Clotting disorder Mother   . Cancer Mother        breast/colon  . Mental illness Brother   . Heart disease Brother   . Cerebral palsy Daughter   . Emphysema Brother        never smoker  . Colon cancer Maternal Aunt   . Cancer Maternal Aunt        colon  . Colon cancer Maternal Uncle   . Cancer Maternal Uncle        colon  . Esophageal cancer Neg Hx   . Rectal cancer Neg Hx   .  Stomach cancer Neg Hx   . Thyroid disease Neg Hx     Social History   Socioeconomic History  . Marital status: Single    Spouse name: Not on file  . Number of children: 5  . Years of education: Not on file  . Highest education level: Not on file  Occupational History  . Occupation: Disabled  Social Needs  . Financial resource strain: Not on file  . Food insecurity:    Worry: Not on file    Inability: Not on file  . Transportation needs:    Medical: Not on file    Non-medical: Not on file  Tobacco Use  . Smoking status: Former Smoker    Packs/day: 0.30    Years: 10.00    Pack years: 3.00    Types: Cigarettes    Last attempt to quit: 03/26/1988    Years since quitting: 29.8  . Smokeless tobacco: Never Used  Substance and Sexual Activity  . Alcohol use: No  . Drug use: No  . Sexual activity: Not Currently  Lifestyle  . Physical activity:    Days per week: Not on file    Minutes per session: Not on file  . Stress: Not on file  Relationships  . Social connections:    Talks on phone: Not on file    Gets together: Not on file    Attends religious service: Not on file    Active member of club or organization: Not on file    Attends meetings of clubs or organizations: Not on file    Relationship status: Not on file  . Intimate partner violence:    Fear of current or ex partner: Not on file    Emotionally abused: Not on file     Physically abused: Not on file    Forced sexual activity: Not on file  Other Topics Concern  . Not on file  Social History Narrative  . Not on file    Past Medical History, Surgical history, Social history, and Family history were reviewed and updated as appropriate.   Please see review of systems for further details on the patient's review from today.   Review of Systems:  Review of Systems  Constitutional: Positive for fatigue. Negative for appetite change, chills, diaphoresis, fever and unexpected weight change.  HENT: Negative for dental problem, mouth sores and trouble swallowing.   Respiratory: Negative for cough, chest tightness and shortness of breath.   Cardiovascular: Negative for chest pain and palpitations.  Gastrointestinal: Positive for anal bleeding, blood in stool and constipation. Negative for abdominal distention, abdominal pain, diarrhea, nausea, rectal pain and vomiting.  Neurological: Positive for dizziness. Negative for syncope, weakness and headaches.    Objective:   Physical Exam:  BP (!) 146/62 (BP Location: Right Arm, Patient Position: Sitting)   Pulse 89   Temp 98.2 F (36.8 C) (Oral)   Resp 19   Ht 5\' 7"  (1.702 m)   Wt 246 lb 3.2 oz (111.7 kg)   LMP 02/25/2009   SpO2 100%   BMI 38.56 kg/m  ECOG: 1  Physical Exam  Constitutional: No distress.  HENT:  Head: Normocephalic and atraumatic.  Mouth/Throat: No oropharyngeal exudate.  Cardiovascular: Normal rate, regular rhythm and normal heart sounds. Exam reveals no gallop and no friction rub.  No murmur heard. Pulmonary/Chest: Effort normal and breath sounds normal. No respiratory distress. She has no wheezes. She has no rales.  Abdominal: Soft. Bowel sounds are normal. She exhibits no distension  and no mass. There is no tenderness. There is no rebound and no guarding.  Neurological: She is alert. Coordination normal.  Skin: Skin is warm and dry. No rash noted. She is not diaphoretic. No erythema.     Lab Review:     Component Value Date/Time   NA 141 01/06/2018 1002   NA 139 03/29/2017 1048   K 3.7 01/06/2018 1002   K 3.9 03/29/2017 1048   CL 108 01/06/2018 1002   CL 105 07/07/2012 1327   CO2 24 01/06/2018 1002   CO2 23 03/29/2017 1048   GLUCOSE 157 (H) 01/06/2018 1002   GLUCOSE 117 03/29/2017 1048   GLUCOSE 104 (H) 07/07/2012 1327   BUN 13 01/06/2018 1002   BUN 16.3 03/29/2017 1048   CREATININE 0.71 01/06/2018 1002   CREATININE 0.9 03/29/2017 1048   CALCIUM 8.8 (L) 01/06/2018 1002   CALCIUM 8.9 03/29/2017 1048   PROT 7.6 01/06/2018 1002   PROT 7.5 03/29/2017 1048   ALBUMIN 4.0 01/06/2018 1002   ALBUMIN 3.7 03/29/2017 1048   AST 38 01/06/2018 1002   AST 9 03/29/2017 1048   ALT 17 01/06/2018 1002   ALT <6 03/29/2017 1048   ALKPHOS 132 (H) 01/06/2018 1002   ALKPHOS 109 03/29/2017 1048   BILITOT 2.3 (H) 01/06/2018 1002   BILITOT 0.72 03/29/2017 1048   GFRNONAA >60 01/06/2018 1002   GFRAA >60 01/06/2018 1002       Component Value Date/Time   WBC 4.3 02/05/2018 1107   RBC 1.68 (L) 02/05/2018 1107   HGB 4.9 (LL) 02/05/2018 1107   HGB 4.2 (LL) 01/29/2018 1151   HGB 4.2 (LL) 12/31/2017 1154   HGB 11.8 03/29/2017 1048   HCT 17.4 (L) 02/05/2018 1107   HCT 13.1 (LL) 12/31/2017 1154   HCT 36.0 03/29/2017 1048   PLT 147 (L) 02/05/2018 1107   PLT 157 01/29/2018 1151   PLT 108 (L) 12/31/2017 1154   MCV 103.6 (H) 02/05/2018 1107   MCV 87 12/31/2017 1154   MCV 91.0 03/29/2017 1048   MCH 29.2 02/05/2018 1107   MCHC 28.2 (L) 02/05/2018 1107   RDW 21.2 (H) 02/05/2018 1107   RDW 22.3 (H) 12/31/2017 1154   RDW 14.9 (H) 03/29/2017 1048   LYMPHSABS 1.5 02/05/2018 1107   LYMPHSABS 1.1 12/31/2017 1154   LYMPHSABS 0.8 (L) 03/29/2017 1048   MONOABS 0.4 02/05/2018 1107   MONOABS 0.1 03/29/2017 1048   EOSABS 0.1 02/05/2018 1107   EOSABS 0.0 12/31/2017 1154   BASOSABS 0.0 02/05/2018 1107   BASOSABS 0.0 12/31/2017 1154   BASOSABS 0.0 03/29/2017 1048    -------------------------------  Imaging from last 24 hours (if applicable):  Radiology interpretation: Nm Pet Image Restag (ps) Skull Base To Thigh  Result Date: 01/16/2018 CLINICAL DATA:  Subsequent treatment strategy for breast carcinoma. Bone metastasis. Chronic anemia. EXAM: NUCLEAR MEDICINE PET SKULL BASE TO THIGH TECHNIQUE: 13.4 mCi F-18 FDG was injected intravenously. Full-ring PET imaging was performed from the skull base to thigh after the radiotracer. CT data was obtained and used for attenuation correction and anatomic localization. Fasting blood glucose: 97 mg/dl COMPARISON:  Bone scan 08/12/2017, FDG PET scan 03/01/2016 FINDINGS: Mediastinal blood pool activity: SUV max 1.96 NECK: No hypermetabolic lymph nodes in the neck. Incidental CT findings: none CHEST: No hypermetabolic mediastinal or hilar nodes. No suspicious pulmonary nodules on the CT scan. Incidental CT findings: none ABDOMEN/PELVIS: No abnormal hypermetabolic activity within the liver, pancreas, adrenal glands, or spleen. No hypermetabolic lymph nodes in the abdomen  or pelvis. There is focal activity the pancreatic head. There is physiologic activity in the bowel and stomach. This pancreatic head activity is nonspecific (SUV max equal 6.1 on image 117) Incidental CT findings: Post hysterectomy SKELETON: There is diffuse sclerotic change throughout the skeleton which is similar to recent CT exams (08/27/2017 and 06/13/2017) but increased from PET-CT scan of 03/01/2016. There is intense metabolic activity throughout the axial and appendicular skeleton. Particular intense activity in the proximal femurs and humeri. This pattern is typical of marrow response and normal likely relates to severe anemia chronic anemia. There is scattered foci of more increased uptake in the spine. Difficult to identify malignancy on the background of diffuse marrow activity. Incidental CT findings: No clear pathologic fracture identified. IMPRESSION:  1. Most striking finding is intense marrow activity diffusely throughout the axillary and appendicular skeleton. This is favored a physiologic marrow response from chronic anemia rather than diffuse metastatic disease. 2. Several discrete foci of more intense activity noted in the spine which could indicate malignancy or trauma. Difficult to define malignancy on the background of diffuse marrow activity. 3. Diffuse sclerosis throughout the bones similar to a recent CT scans but new from remote PET-CT scan 03/01/2016. 4. No evidence soft tissue metastasis. Electronically Signed   By: Suzy Bouchard M.D.   On: 01/16/2018 08:59        This case was discussed with Dr. Burr Medico. She expressed agreement with my management of this patient.

## 2018-02-10 NOTE — Progress Notes (Addendum)
[place]  [phone] [phone]  Clinic Follow up Note   Patient Care Team: [name] as PCP - General (Physician Assistant) [name], MD as Consulting Physician (Pulmonary Disease) [name], MD as Consulting Physician (Hematology) [name], NP as Nurse Practitioner (Hospice and Palliative Medicine) 02/11/2018  SUMMARY OF ONCOLOGIC HISTORY: Oncology History   Cancer Staging Malignant neoplasm of overlapping sites of left breast in female, estrogen receptor positive ([place]) Staging form: Breast, AJCC 7th Edition - Clinical: Stage IIIA (T2, N2, M0) - Unsigned       Malignant neoplasm of overlapping sites of left breast in female, estrogen receptor positive ([place])   05/2008 Cancer Diagnosis    Left sided breast cancer (no path report available)    11/2008 Pathologic Stage    Stage IIIA: T2 N2     Neo-Adjuvant Chemotherapy    Paclitaxel weekly x 12; doxorubicin and cyclophosphamide x 4 - both given with trifiparnib (treated at [facility] in [place])    11/2008 Definitive Surgery    Left modified radiation mastectomy: invasive lobular carcinoma, ER+, PR+, HER2/neu negative, 5/22 LN positive     - 03/2009 Radiation Therapy    Adjuvant radiation to left chest wall    2011 - 08/2014 Anti-estrogen oral therapy    Tamoxifen 20 mg used briefly; discontinued due to uterine lining concerns; changed to anastrozole 1 mg daily (began 07/2100); discontinued by patient summer of 2016    05/26/2015 Progression    Iliac bone biopsy showed metastatic carcinoma consistent with a breast primary, ER positive, PR and HER-2 negative. Her staging CT, and a PET scan was negative for visceral metastasis.    05/2015 -  Anti-estrogen oral therapy    1. letrozole and Ibrance started March 2017             -Ibrance dose decreased to 75 mg daily  -Letrozole switched to Exemestane on 02/13/17 due to b/l rib pain.     11/16/2015 Miscellaneous    Xgeva monthly for bone metastasis     07/03/2016 Imaging    CT chest, abdomen and pelvis with contrast showed no evidence of metastasis.     02/07/2017 Imaging    CT AP W Contrast 02/07/17 IMPRESSION: 1. No CT findings for abdominal/pelvic metastatic disease. 2. No acute abdominal findings, mass lesions or adenopathy. 3. Status post cholecystectomy with mild associated common bile duct dilatation. 4. Somewhat thickened appendix appears relatively stable. No acute inflammatory process. 5. Stable mixed lytic and sclerotic osseous metastatic disease.     02/07/2017 Imaging    Bone Scan Whole Body 02/07/17 IMPRESSION: No scintigraphic evidence skeletal metastasis     08/12/2017 Imaging    Whole Boday Scan 08/12/17 IMPRESSION: Scattered degenerative type uptake as above with a questionable focus of increased tracer localization at L2 vertebral body versus artifact; no abnormality is seen at this site by CT. Consider either characterization by MR or attention on follow-up imaging.    08/12/2017 Imaging    CT AP W Contrast 08/12/17  IMPRESSION: Continued thickening of the appendix is noted without surrounding inflammation. It has maximum measured diameter is decreased compared to prior exam. There is no evidence of acute inflammation. Stable mixed lytic and sclerotic appearance of visualized skeleton is noted consistent with history of known osseous metastases. No acute abnormality seen in the abdomen or pelvis.    08/22/2017 Surgery    APPENDECTOMY LAPAROSCOPIC ERAS PATHWAY by Dr. Malcolm Metro and REMOVAL OF LEFT TISSUE EXPANDER  by Dr. Harlow Mares 08/22/17    08/22/2017 Pathology Results    Diagnosis 08/22/17  1. Appendix, Other than Incidental - METASTATIC CARCINOMA, CONSISTENT WITH BREAST PRIMARY. - CARCINOMA IS PRESENT AT THE SURGICAL RESECTION MARGIN AS WELL AS THE SEROSAL SURFACE. - LYMPHOVASCULAR INVASION IS IDENTIFIED. - SEE COMMENT. 2. Breast, capsule, Left - DENSE FIBROUS  TISSUE WITH MILD INFLAMMATION, INCLUDING A FEW SCATTERED MULTINUCLEATED GIANT CELLS. - THERE IS NO EVIDENCE OF MALIGNANCY.    08/27/2017 Imaging    CT AP W Contrast 08/27/17 IMPRESSION: No evidence of pulmonary embolus. Small pericardial effusion with uncertain clinical significance. No evidence of acute abnormalities within the chest, abdomen or pelvis. Area of fat stranding and small focus of gas within the anterior abdominal wall, immediately inferior to the umbilicus, probably at the site of laparoscopic access.    10/07/2017 Echocardiogram    10/07/2017 ECO Study Conclusions  - Procedure narrative: Transthoracic echocardiography. Image   quality was adequate. The study was technically difficult, as a   result of poor acoustic windows. - Left ventricle: The cavity size was normal. Wall thickness was   normal. Systolic function was normal. The estimated ejection   fraction was in the range of 60% to 65%. Wall motion was normal;   there were no regional wall motion abnormalities. Features are   consistent with a pseudonormal left ventricular filling pattern,   with concomitant abnormal relaxation and increased filling   pressure (grade 2 diastolic dysfunction).    01/16/2018 PET scan    01/16/2018 PET Scan IMPRESSION: 1. Most striking finding is intense marrow activity diffusely throughout the axillary and appendicular skeleton. This is favored a physiologic marrow response from chronic anemia rather than diffuse metastatic disease. 2. Several discrete foci of more intense activity noted in the spine which could indicate malignancy or trauma. Difficult to define malignancy on the background of diffuse marrow activity. 3. Diffuse sclerosis throughout the bones similar to a recent CT scans but new from remote PET-CT scan 03/01/2016. 4. No evidence soft tissue metastasis.    CURRENT TREATMENT:   1. letrozole and Ibrancestarted March 2017 -Ibrance dose decreased to 75 mg  daily, held on 06/03/2017 due to surgery, restarted around 09/19/17, stopped on 12/19/2017 due to severe pancytopenia    -Letrozole switched to Exemestane on 02/13/17 due to b/l rib pain.  2. Xgevastarted 11/16/2015, repeated monthly  INTERVAL HISTORY: Ms. Egnew returns for follow up and aranesp as scheduled. She continues to have low activity tolerance and dyspnea on exertion. She uses supplemental 2 liters O2 for ADLs. She requires frequent rest. She continues to have cough that is intermittently productive, but improved lately. Hycodan is helping. She denies chest pain. Having normal BM lately, no recent blood in stool. She has not resumed lovenox yet. She is getting palliative care visits at home. Denies fever or chills.    MEDICAL HISTORY:  Past Medical History:  Diagnosis Date  . Anxiety   . Arthritis    hands, knees, hips  . Asthma   . Bronchitis   . Cancer ([place])   . Cervical stenosis (uterine cervix)   . Disorder of appendix    Enlarged  . Dyspnea on exertion   . GERD (gastroesophageal reflux disease)   . Headache(784.0)   . History of breast cancer    2010--  LEFT  s/p  mastectomy (in [place]) AND CHEMORADIATION--  NO RECURRENCE  . History of cervical dysplasia   . Hyperlipidemia   . Hypertension   . Malignant neoplasm  of overlapping sites of left breast in female, estrogen receptor positive ([place]) 03/05/2013  . OSA (obstructive sleep apnea) moderate osa per study  09/2010   CPAP  , NOT USING ON REGULAR BASIS  . Pelvic pain in female   . Positive H. pylori test    08-05-2013  . Positive TB test    AS TEEN--  [place]/ MEDS  . Refusal of blood transfusions as patient is Jehovah's Witness   . Seasonal allergies   . Uterine fibroid   . Wears glasses     SURGICAL HISTORY: Past Surgical History:  Procedure Laterality Date  . APPENDECTOMY  y  . CARDIOVASCULAR STRESS TEST  10-29-2012   low risk perfusion study/  no significant reversibity/ ef 66%/  normal wall motion  . CERVICAL  CONIZATION W/BX  2012   in  [place] N/A 05/14/2012   Procedure: LAPAROSCOPIC CHOLECYSTECTOMY;  Surgeon: Ralene Ok, MD;  Location: [facility];  Service: General;  Laterality: N/A;  . COLONOSCOPY  2011   normal per patient - [place]  . DILATION AND CURETTAGE OF UTERUS  10/15/2011   Procedure: DILATATION AND CURETTAGE;  Surgeon: Melina Schools, MD;  Location: [facility];  Service: Gynecology;  Laterality: N/A;  Conization &  endocervical curettings  . EXAMINATION UNDER ANESTHESIA N/A 08/20/2013   Procedure: EXAM UNDER ANESTHESIA;  Surgeon: Margarette Asal, MD;  Location: [facility];  Service: Gynecology;  Laterality: N/A;  . [place]   right  . LAPAROSCOPIC APPENDECTOMY N/A 08/22/2017   Procedure: [place];  Surgeon: Coralie Keens, MD;  Location: [facility];  Service: General;  Laterality: N/A;  . LAPAROSCOPIC ASSISTED VAGINAL HYSTERECTOMY N/A 10/26/2014   Procedure: HYSTERECTOMY ABDOMINAL ;  Surgeon: Molli Posey, MD;  Location: [facility];  Service: Gynecology;  Laterality: N/A;  . MASTECTOMY Left 11/2008  in [place]   . SALPINGOOPHORECTOMY Bilateral 10/26/2014   Procedure: BILATERAL SALPINGO OOPHORECTOMY;  Surgeon: Molli Posey, MD;  Location: [facility];  Service: Gynecology;  Laterality: Bilateral;  . TISSUE EXPANDER PLACEMENT Left 08/22/2017   Procedure: REMOVAL OF LEFT TISSUE EXPANDER;  Surgeon: Crissie Reese, MD;  Location: [facility];  Service: Plastics;  Laterality: Left;  . TISSUE EXPANDER REMOVAL Left 08/22/2017  . TRANSTHORACIC ECHOCARDIOGRAM  10-29-2012   mild lvh/  ef 60-65%/  grade II diastolic dysfunction/  trivial mr  &  tr    I have reviewed the social history and family history with the patient and they are unchanged from previous note.  ALLERGIES:  is allergic to lisinopril-hydrochlorothiazide; adhesive [tape]; fentanyl; gabapentin; hctz [hydrochlorothiazide]; latex; lisinopril; losartan potassium; other;  oxycodone; and tums [calcium carbonate antacid].  MEDICATIONS:  Current Outpatient Medications  Medication Sig Dispense Refill  . carbamide peroxide (DEBROX) 6.5 % OTIC solution Place 5 drops into both ears 2 (two) times daily. 15 mL 0  . cetirizine (ZYRTEC) 10 MG tablet Take 10 mg by mouth daily.  11  . chlorpheniramine-HYDROcodone (TUSSIONEX PENNKINETIC ER) 10-8 MG/5ML SUER Take 5 mLs by mouth every 12 (twelve) hours as needed for cough. 230 mL 0  . diphenhydrAMINE (BENADRYL) 25 mg capsule Take 1 capsule (25 mg total) by mouth every 6 (six) hours as needed for itching. 30 capsule 0  . exemestane (AROMASIN) 25 MG tablet Take 1 tablet (25 mg total) by mouth daily after breakfast. 90 tablet 1  . fluticasone furoate-vilanterol (BREO ELLIPTA) 100-25 MCG/INH AEPB Inhale 1 puff into  the lungs daily. 28 each 5  . folic acid (FOLVITE) 1 MG tablet Take 1 tablet (1 mg total) by mouth daily. 30 tablet 0  . HYDROcodone-homatropine (HYCODAN) 5-1.5 MG/5ML syrup Take 5 mLs by mouth every 6 (six) hours as needed for cough. 120 mL 0  . pantoprazole (PROTONIX) 40 MG tablet Take 1 tablet (40 mg total) by mouth daily. Take 30-60 min before first meal of the day 30 tablet 2  . promethazine (PHENERGAN) 25 MG tablet Take 1 tablet (25 mg total) by mouth every 6 (six) hours as needed for nausea or vomiting. 30 tablet 0  . vitamin B-12 1000 MCG tablet Take 1 tablet (1,000 mcg total) by mouth daily. 30 tablet 0  . furosemide (LASIX) 20 MG tablet Take 1 tablet (20 mg total) by mouth daily as needed (for fluid retention.). 30 tablet 5  . ipratropium-albuterol (DUONEB) 0.5-2.5 (3) MG/3ML SOLN Take 3 mLs by nebulization every 6 (six) hours as needed. (Patient taking differently: Take 3 mLs by nebulization every 6 (six) hours as needed (wheezing/shortness of breath). ) 360 mL 5  . montelukast (SINGULAIR) 10 MG tablet Take 1 tablet (10 mg total) by mouth at bedtime. (Patient not taking: Reported on 02/11/2018) 30 tablet 5   No  current facility-administered medications for this visit.    Facility-Administered Medications Ordered in Other Visits  Medication Dose Route Frequency Provider Last Rate Last Dose  . 0.9 %  sodium chloride infusion   Intravenous Once [name], MD        PHYSICAL EXAMINATION: ECOG PERFORMANCE STATUS: 3 - Symptomatic, >50% confined to bed BP 133/58 P 85 RR 19 T 98.3 O2 100% RA Wt 243.5 lbs  GENERAL:alert, no distress and comfortable SKIN: no rashes or significant lesions EYES: sclera clear OROPHARYNX:no thrush or ulcers; pale oral mucosa  LYMPH:  no palpable cervical lymphadenopathy  LUNGS: clear to auscultation with normal breathing effort HEART: regular rate & rhythm, no lower extremity edema ABDOMEN:abdomen soft, non-tender and normal bowel sounds Musculoskeletal:no cyanosis of digits  NEURO: alert & oriented x 3 with fluent speech  LABORATORY DATA:  I have reviewed the data as listed CBC Latest Ref Rng & Units 02/11/2018 02/05/2018 01/29/2018  WBC 4.0 - 10.5 K/uL 3.5(L) 4.3 4.8  Hemoglobin 12.0 - 15.0 g/dL 4.9(LL) 4.9(LL) 4.2(LL)  Hematocrit 36.0 - 46.0 % 17.4(L) 17.4(L) 14.6(L)  Platelets 150 - 400 K/uL 103(L) 147(L) 157     CMP Latest Ref Rng & Units 02/11/2018 01/06/2018 01/01/2018  Glucose 70 - 99 mg/dL 146(H) 157(H) 133(H)  BUN 6 - 20 mg/dL _0 Creatinine 0.44 - 1.00 mg/dL 0.71 0.71 0.68  Sodium 135 - 145 mmol/L 142 141 137  Potassium 3.5 - 5.1 mmol/L 3.7 3.7 3.5  Chloride 98 - 111 mmol/L 108 108 109  CO2 22 - 32 mmol/L 24 24 21(L)  Calcium 8.9 - 10.3 mg/dL 9.5 8.8(L) 8.9  Total Protein 6.5 - 8.1 g/dL 7.2 7.6 -  Total Bilirubin 0.3 - 1.2 mg/dL 1.9(H) 2.3(H) -  Alkaline Phos 38 - 126 U/L 133(H) 132(H) -  AST 15 - 41 U/L 21 38 -  ALT 0 - 44 U/L 12 17 -      RADIOGRAPHIC STUDIES: I have personally reviewed the radiological images as listed and agreed with the findings in the report. No results found.   ASSESSMENT & PLAN: Evely J Scottis a 57  y.o.female with a history of HTN, HLD, Arthritis, Asthma, Sleep Apnea, and H. Pylori.  1. 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 Had been tolerating treatment well overall, but developed significant pancytopenia with severe symptomatic anemia felt to be related to Joycelyn Man held as of 9/26, she began aranesp 300 mcg on 9/20, weekly  -01/16/18 PET scan previously reviewed, which showed diffuse marrow activity felt to be related to response from anemia -thrombocytopenia and neutropenia have improved; ANC is normal now, plt stable but remains mildly thrombocytopenic, PLT 103K today. She denies recent bleeding  -Hgb 4.9 today -Sheis Jehovah'sWitness and repeatedly will not accept blood transfusion; -She is being followed by palliative care services at home  -has medical equipment including home oxygen, wheelchair, and hospital bed at home  -she will continue aranesp 300 mcg weekly for now -Continue exemestane; will also holdxgevafor now -she knows to report to ED with worsening dyspnea, chest pain, or new symptoms  -Her FO report reveals PIK3CA mutation, I discussed with Dr. Burr Medico previously and the patient today. Given this mutation, she is a candidate for Alpelisib [facility]), along with fulvestrant injection. I reviewed potential toxicities, including fatigue, diarrhea, rash, metabolic alterations, and anemia although grade 3/4 anemia is rare. I gave her printed material today. The patient seems interested and hopeful.  We would not begin treatment until Hgb recovers to 7-8 range. Dr. Burr Medico will discuss further at her next visit. -return weekly for aranesp, f/u in 2 weeks    2. Primary breast cancer of left breast overlapping sites diagnosed March of 2010and treated [facility]adjuvant[facility] with (a) paclitaxel weekly x12 and (b) doxorubicin/ cyclophosphamidein dose dense  fashion x4, both given with tifiparnib.  -definitive left modified radical mastectomy September of 2010 for a T2 N2 or stage IIIA invasive lobularbreast cancer, estrogen and progesterone receptor positive, HER-2 negative,  -post mastectomy radiation completed January of 2011, -briefly on tamoxifen, discontinued it because of uterine lining concerns. On anastrozolesince May 2012 with good tolerance. Held between November and December 2013 due to GI issues, unrelated to cancer or its treatment.  -Discontinued by the patient summer of 2016  3. Bilateral rib cage pain, Leftchest wallpain -stable  4. Lower Extremity Edema -Repeat echocardiogram in 12/2016 and 09/2017 shows normal EF -holding lasix lately  5. Dyspnea, Cough -Uses Flovent -followed bypulmonologist Dr. Melvyn Novas  -now on home O2 2 liters via [place], uses oxygen with ADLs -recently started augmentin and mucinex per PCP, cough not much better; afebrile. she completed antibiotics, not much improvement in her cough -Dr. Burr Medico prescribed hycodan, which is helping. Cough improved today   6. Insomnia   7. Left breast implant rupture, removed -She underwent breast reconstruction on 08/22/17 and had implant completely removed by Dr. Marica Otter   8. 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 Ninfa Linden on 08/22/17 which was found to be metastasis of her breast cancer.   9. Goal of care discussion  -she is full code now.  -she is working with palliative care and SW to complete AD's  10. Right LE DVT  -discovered while at [facility]; felt to be related to metastatic cancer, immobility, and aranesp injection -prescribed lovenox injection, but held last week due to GI bleeding; she has not resumed.  -She denies recent rectal bleeding, I recommend she resume lovenox 80 mg daily to prevent worsening DVT or PE, she agrees to restart   PLAN: -labs reviewed -continue aranesp 300 mcg weekly, due  today -continue exemestane  -given printed  material on Alpelisib (Piqray) and fulvestrant, Dr. Burr Medico to discuss further at next f/u -Discuss with SW to assist with AD's -continue palliative care visits at home   All questions were answered. The patient knows to call the clinic with any problems, questions or concerns. No barriers to learning was detected. I spent 20 minutes counseling the patient face to face. The total time spent in the appointment was 25 minutes and more than 50% was on counseling and review of test results     Alla Feeling, NP 02/11/18

## 2018-02-11 ENCOUNTER — Ambulatory Visit: Payer: Medicare Other

## 2018-02-11 ENCOUNTER — Telehealth: Payer: Self-pay | Admitting: Nurse Practitioner

## 2018-02-11 ENCOUNTER — Inpatient Hospital Stay (HOSPITAL_BASED_OUTPATIENT_CLINIC_OR_DEPARTMENT_OTHER): Payer: Medicare Other | Admitting: Nurse Practitioner

## 2018-02-11 ENCOUNTER — Encounter: Payer: Self-pay | Admitting: Nurse Practitioner

## 2018-02-11 ENCOUNTER — Inpatient Hospital Stay: Payer: Medicare Other

## 2018-02-11 DIAGNOSIS — I82401 Acute embolism and thrombosis of unspecified deep veins of right lower extremity: Secondary | ICD-10-CM

## 2018-02-11 DIAGNOSIS — Z170 Estrogen receptor positive status [ER+]: Principal | ICD-10-CM

## 2018-02-11 DIAGNOSIS — C7951 Secondary malignant neoplasm of bone: Secondary | ICD-10-CM

## 2018-02-11 DIAGNOSIS — R0609 Other forms of dyspnea: Secondary | ICD-10-CM

## 2018-02-11 DIAGNOSIS — D61818 Other pancytopenia: Secondary | ICD-10-CM

## 2018-02-11 DIAGNOSIS — C785 Secondary malignant neoplasm of large intestine and rectum: Secondary | ICD-10-CM

## 2018-02-11 DIAGNOSIS — C50812 Malignant neoplasm of overlapping sites of left female breast: Secondary | ICD-10-CM | POA: Diagnosis not present

## 2018-02-11 DIAGNOSIS — R05 Cough: Secondary | ICD-10-CM

## 2018-02-11 DIAGNOSIS — D649 Anemia, unspecified: Secondary | ICD-10-CM

## 2018-02-11 DIAGNOSIS — Z9981 Dependence on supplemental oxygen: Secondary | ICD-10-CM

## 2018-02-11 LAB — CMP (CANCER CENTER ONLY)
ALK PHOS: 133 U/L — AB (ref 38–126)
ALT: 12 U/L (ref 0–44)
AST: 21 U/L (ref 15–41)
Albumin: 3.2 g/dL — ABNORMAL LOW (ref 3.5–5.0)
Anion gap: 10 (ref 5–15)
BUN: 8 mg/dL (ref 6–20)
CO2: 24 mmol/L (ref 22–32)
CREATININE: 0.71 mg/dL (ref 0.44–1.00)
Calcium: 9.5 mg/dL (ref 8.9–10.3)
Chloride: 108 mmol/L (ref 98–111)
Glucose, Bld: 146 mg/dL — ABNORMAL HIGH (ref 70–99)
Potassium: 3.7 mmol/L (ref 3.5–5.1)
SODIUM: 142 mmol/L (ref 135–145)
Total Bilirubin: 1.9 mg/dL — ABNORMAL HIGH (ref 0.3–1.2)
Total Protein: 7.2 g/dL (ref 6.5–8.1)

## 2018-02-11 LAB — CBC WITH DIFFERENTIAL (CANCER CENTER ONLY)
BASOS ABS: 0 10*3/uL (ref 0.0–0.1)
Basophils Relative: 1 %
EOS PCT: 4 %
Eosinophils Absolute: 0.1 10*3/uL (ref 0.0–0.5)
HCT: 17.4 % — ABNORMAL LOW (ref 36.0–46.0)
Hemoglobin: 4.9 g/dL — CL (ref 12.0–15.0)
LYMPHS ABS: 1.1 10*3/uL (ref 0.7–4.0)
LYMPHS PCT: 31 %
MCH: 28.8 pg (ref 26.0–34.0)
MCHC: 28.2 g/dL — ABNORMAL LOW (ref 30.0–36.0)
MCV: 102.4 fL — ABNORMAL HIGH (ref 80.0–100.0)
MONOS PCT: 10 %
Monocytes Absolute: 0.4 10*3/uL (ref 0.1–1.0)
Neutro Abs: 1.9 10*3/uL (ref 1.7–7.7)
Neutrophils Relative %: 54 %
PLATELETS: 103 10*3/uL — AB (ref 150–400)
RBC: 1.7 MIL/uL — AB (ref 3.87–5.11)
RDW: 20.2 % — AB (ref 11.5–15.5)
WBC Count: 3.5 10*3/uL — ABNORMAL LOW (ref 4.0–10.5)
nRBC: 12.7 % — ABNORMAL HIGH (ref 0.0–0.2)
nRBC: 13 /100 WBC — ABNORMAL HIGH

## 2018-02-11 MED ORDER — DARBEPOETIN ALFA 300 MCG/0.6ML IJ SOSY
300.0000 ug | PREFILLED_SYRINGE | Freq: Once | INTRAMUSCULAR | Status: AC
  Administered 2018-02-11: 300 ug via SUBCUTANEOUS

## 2018-02-11 MED ORDER — DARBEPOETIN ALFA 300 MCG/0.6ML IJ SOSY
PREFILLED_SYRINGE | INTRAMUSCULAR | Status: AC
  Filled 2018-02-11: qty 0.6

## 2018-02-11 NOTE — Progress Notes (Signed)
Received call from [facility] laboratory notifying of Hgb 4.9. Hughie Closs, LPN made aware. Pt to see Regan Rakers, NP in clinic today. Pt Jehovah's Witness and has historically refused blood.

## 2018-02-11 NOTE — Telephone Encounter (Signed)
Printed calendar and avs. °

## 2018-02-11 NOTE — Patient Instructions (Signed)
Due to your PIK3 mutation, you may be eligible for Piqray (Alpelisib) treatment for your breast cancer. Dr. Burr Medico will discuss further at your next visit.   Fulvestrant injection What is this medicine? FULVESTRANT (ful VES trant) blocks the effects of estrogen. It is used to treat breast cancer. This medicine may be used for other purposes; ask your health care provider or pharmacist if you have questions. COMMON BRAND NAME(S): FASLODEX What should I tell my health care provider before I take this medicine? They need to know if you have any of these conditions: -bleeding problems -liver disease -low levels of platelets in the blood -an unusual or allergic reaction to fulvestrant, other medicines, foods, dyes, or preservatives -pregnant or trying to get pregnant -breast-feeding How should I use this medicine? This medicine is for injection into a muscle. It is usually given by a health care professional in a hospital or clinic setting. Talk to your pediatrician regarding the use of this medicine in children. Special care may be needed. Overdosage: If you think you have taken too much of this medicine contact a poison control center or emergency room at once. NOTE: This medicine is only for you. Do not share this medicine with others. What if I miss a dose? It is important not to miss your dose. Call your doctor or health care professional if you are unable to keep an appointment. What may interact with this medicine? -medicines that treat or prevent blood clots like warfarin, enoxaparin, and dalteparin This list may not describe all possible interactions. Give your health care provider a list of all the medicines, herbs, non-prescription drugs, or dietary supplements you use. Also tell them if you smoke, drink alcohol, or use illegal drugs. Some items may interact with your medicine. What should I watch for while using this medicine? Your condition will be monitored carefully while you are  receiving this medicine. You will need important blood work done while you are taking this medicine. Do not become pregnant while taking this medicine or for at least 1 year after stopping it. Women of child-bearing potential will need to have a negative pregnancy test before starting this medicine. Women should inform their doctor if they wish to become pregnant or think they might be pregnant. There is a potential for serious side effects to an unborn child. Men should inform their doctors if they wish to father a child. This medicine may lower sperm counts. Talk to your health care professional or pharmacist for more information. Do not breast-feed an infant while taking this medicine or for 1 year after the last dose. What side effects may I notice from receiving this medicine? Side effects that you should report to your doctor or health care professional as soon as possible: -allergic reactions like skin rash, itching or hives, swelling of the face, lips, or tongue -feeling faint or lightheaded, falls -pain, tingling, numbness, or weakness in the legs -signs and symptoms of infection like fever or chills; cough; flu-like symptoms; sore throat -vaginal bleeding Side effects that usually do not require medical attention (report to your doctor or health care professional if they continue or are bothersome): -aches, pains -constipation -diarrhea -headache -hot flashes -nausea, vomiting -pain at site where injected -stomach pain This list may not describe all possible side effects. Call your doctor for medical advice about side effects. You may report side effects to FDA at 1-800-FDA-1088. Where should I keep my medicine? This drug is given in a hospital or clinic and will  not be stored at home. NOTE: This sheet is a summary. It may not cover all possible information. If you have questions about this medicine, talk to your doctor, pharmacist, or health care provider.  2018 Elsevier/Gold  Standard (2014-10-08 11:03:55)

## 2018-02-12 ENCOUNTER — Encounter (HOSPITAL_COMMUNITY): Payer: Self-pay | Admitting: Hematology

## 2018-02-12 ENCOUNTER — Other Ambulatory Visit: Payer: Medicare Other

## 2018-02-12 ENCOUNTER — Ambulatory Visit: Payer: Medicare Other

## 2018-02-13 ENCOUNTER — Other Ambulatory Visit (INDEPENDENT_AMBULATORY_CARE_PROVIDER_SITE_OTHER): Payer: Self-pay | Admitting: Physician Assistant

## 2018-02-13 ENCOUNTER — Other Ambulatory Visit: Payer: Self-pay | Admitting: Hematology

## 2018-02-13 NOTE — Telephone Encounter (Signed)
FWD to PCP. Tempestt S Roberts, CMA  

## 2018-02-18 ENCOUNTER — Inpatient Hospital Stay: Payer: Medicare Other

## 2018-02-18 VITALS — BP 121/63 | HR 77 | Temp 98.5°F | Resp 18

## 2018-02-18 DIAGNOSIS — C7951 Secondary malignant neoplasm of bone: Secondary | ICD-10-CM

## 2018-02-18 DIAGNOSIS — C50812 Malignant neoplasm of overlapping sites of left female breast: Secondary | ICD-10-CM

## 2018-02-18 DIAGNOSIS — C50912 Malignant neoplasm of unspecified site of left female breast: Secondary | ICD-10-CM

## 2018-02-18 DIAGNOSIS — Z170 Estrogen receptor positive status [ER+]: Secondary | ICD-10-CM

## 2018-02-18 LAB — CBC WITH DIFFERENTIAL/PLATELET
Abs Immature Granulocytes: 0.2 10*3/uL — ABNORMAL HIGH (ref 0.00–0.07)
BAND NEUTROPHILS: 8 %
BASOS ABS: 0 10*3/uL (ref 0.0–0.1)
Basophils Relative: 0 %
EOS ABS: 0.3 10*3/uL (ref 0.0–0.5)
Eosinophils Relative: 7 %
HEMATOCRIT: 17 % — AB (ref 36.0–46.0)
HEMOGLOBIN: 5 g/dL — AB (ref 12.0–15.0)
Lymphocytes Relative: 32 %
Lymphs Abs: 1.2 10*3/uL (ref 0.7–4.0)
MCH: 28.4 pg (ref 26.0–34.0)
MCHC: 29.4 g/dL — ABNORMAL LOW (ref 30.0–36.0)
MCV: 96.6 fL (ref 80.0–100.0)
MONOS PCT: 3 %
MYELOCYTES: 2 %
Metamyelocytes Relative: 2 %
Monocytes Absolute: 0.1 10*3/uL (ref 0.1–1.0)
NEUTROS ABS: 2.1 10*3/uL (ref 1.7–17.7)
NEUTROS PCT: 45 %
OTHER: 1 %
Platelets: 103 10*3/uL — ABNORMAL LOW (ref 150–400)
RBC: 1.76 MIL/uL — AB (ref 3.87–5.11)
RDW: 19.9 % — ABNORMAL HIGH (ref 11.5–15.5)
WBC: 3.9 10*3/uL — AB (ref 4.0–10.5)
nRBC: 2.8 % — ABNORMAL HIGH (ref 0.0–0.2)
nRBC: 21 /100 WBC — ABNORMAL HIGH

## 2018-02-18 MED ORDER — DARBEPOETIN ALFA 300 MCG/0.6ML IJ SOSY
PREFILLED_SYRINGE | INTRAMUSCULAR | Status: AC
  Filled 2018-02-18: qty 0.6

## 2018-02-18 MED ORDER — DARBEPOETIN ALFA 300 MCG/0.6ML IJ SOSY
300.0000 ug | PREFILLED_SYRINGE | Freq: Once | INTRAMUSCULAR | Status: AC
  Administered 2018-02-18: 300 ug via SUBCUTANEOUS

## 2018-02-18 NOTE — Patient Instructions (Signed)

## 2018-02-22 ENCOUNTER — Other Ambulatory Visit: Payer: Self-pay | Admitting: Hematology

## 2018-02-22 DIAGNOSIS — C7951 Secondary malignant neoplasm of bone: Secondary | ICD-10-CM

## 2018-02-23 NOTE — Progress Notes (Signed)
[facility]  [phone] Fax:([phone]) [phone]  Clinic Follow up Note   Patient Care Team: [name] as PCP - General (Physician Assistant) [name], MD as Consulting Physician (Pulmonary Disease) [name], MD as Consulting Physician (Hematology) [name], NP as Nurse Practitioner (Hospice and Palliative Medicine) 02/24/2018   Chief Complaint: F/u on metastatic breast cancer and severe anemia   SUMMARY OF ONCOLOGIC HISTORY: Oncology History   Cancer Staging Malignant neoplasm of overlapping sites of left breast in female, estrogen receptor positive ([place]) Staging form: Breast, AJCC 7th Edition - Clinical: Stage IIIA (T2, N2, M0) - Unsigned       Malignant neoplasm of overlapping sites of left breast in female, estrogen receptor positive ([place])   05/2008 Cancer Diagnosis    Left sided breast cancer (no path report available)    11/2008 Pathologic Stage    Stage IIIA: T2 N2     Neo-Adjuvant Chemotherapy    Paclitaxel weekly x 12; doxorubicin and cyclophosphamide x 4 - both given with trifiparnib (treated at [facility] in [place])    11/2008 Definitive Surgery    Left modified radiation mastectomy: invasive lobular carcinoma, ER+, PR+, HER2/neu negative, 5/22 LN positive     - 03/2009 Radiation Therapy    Adjuvant radiation to left chest wall    2011 - 08/2014 Anti-estrogen oral therapy    Tamoxifen 20 mg used briefly; discontinued due to uterine lining concerns; changed to anastrozole 1 mg daily (began 07/2100); discontinued by patient summer of 2016    05/26/2015 Progression    Iliac bone biopsy showed metastatic carcinoma consistent with a breast primary, ER positive, PR and HER-2 negative. Her staging CT, and a PET scan was negative for visceral metastasis.    05/2015 -  Anti-estrogen oral therapy    1. letrozole and Ibrance started March 2017             -Ibrance dose decreased to 75 mg daily  -Letrozole switched to  Exemestane on 02/13/17 due to b/l rib pain.     11/16/2015 Miscellaneous    Xgeva monthly for bone metastasis     07/03/2016 Imaging    CT chest, abdomen and pelvis with contrast showed no evidence of metastasis.     02/07/2017 Imaging    CT AP W Contrast 02/07/17 IMPRESSION: 1. No CT findings for abdominal/pelvic metastatic disease. 2. No acute abdominal findings, mass lesions or adenopathy. 3. Status post cholecystectomy with mild associated common bile duct dilatation. 4. Somewhat thickened appendix appears relatively stable. No acute inflammatory process. 5. Stable mixed lytic and sclerotic osseous metastatic disease.     02/07/2017 Imaging    Bone Scan Whole Body 02/07/17 IMPRESSION: No scintigraphic evidence skeletal metastasis     08/12/2017 Imaging    Whole Boday Scan 08/12/17 IMPRESSION: Scattered degenerative type uptake as above with a questionable focus of increased tracer localization at L2 vertebral body versus artifact; no abnormality is seen at this site by CT. Consider either characterization by MR or attention on follow-up imaging.    08/12/2017 Imaging    CT AP W Contrast 08/12/17  IMPRESSION: Continued thickening of the appendix is noted without surrounding inflammation. It has maximum measured diameter is decreased compared to prior exam. There is no evidence of acute inflammation. Stable mixed lytic and sclerotic appearance of visualized skeleton is noted consistent with history of known osseous metastases. No acute abnormality seen in the abdomen or pelvis.    08/22/2017 Surgery  APPENDECTOMY LAPAROSCOPIC ERAS PATHWAY by Dr. Malcolm Metro and REMOVAL OF LEFT TISSUE EXPANDER by Dr. Harlow Mares 08/22/17    08/22/2017 Pathology Results    Diagnosis 08/22/17  1. Appendix, Other than Incidental - METASTATIC CARCINOMA, CONSISTENT WITH BREAST PRIMARY. - CARCINOMA IS PRESENT AT THE SURGICAL RESECTION MARGIN AS WELL AS THE SEROSAL SURFACE. - LYMPHOVASCULAR INVASION  IS IDENTIFIED. - SEE COMMENT. 2. Breast, capsule, Left - DENSE FIBROUS TISSUE WITH MILD INFLAMMATION, INCLUDING A FEW SCATTERED MULTINUCLEATED GIANT CELLS. - THERE IS NO EVIDENCE OF MALIGNANCY.    08/27/2017 Imaging    CT AP W Contrast 08/27/17 IMPRESSION: No evidence of pulmonary embolus. Small pericardial effusion with uncertain clinical significance. No evidence of acute abnormalities within the chest, abdomen or pelvis. Area of fat stranding and small focus of gas within the anterior abdominal wall, immediately inferior to the umbilicus, probably at the site of laparoscopic access.    10/07/2017 Echocardiogram    10/07/2017 ECO Study Conclusions  - Procedure narrative: Transthoracic echocardiography. Image   quality was adequate. The study was technically difficult, as a   result of poor acoustic windows. - Left ventricle: The cavity size was normal. Wall thickness was   normal. Systolic function was normal. The estimated ejection   fraction was in the range of 60% to 65%. Wall motion was normal;   there were no regional wall motion abnormalities. Features are   consistent with a pseudonormal left ventricular filling pattern,   with concomitant abnormal relaxation and increased filling   pressure (grade 2 diastolic dysfunction).    01/16/2018 PET scan    01/16/2018 PET Scan IMPRESSION: 1. Most striking finding is intense marrow activity diffusely throughout the axillary and appendicular skeleton. This is favored a physiologic marrow response from chronic anemia rather than diffuse metastatic disease. 2. Several discrete foci of more intense activity noted in the spine which could indicate malignancy or trauma. Difficult to define malignancy on the background of diffuse marrow activity. 3. Diffuse sclerosis throughout the bones similar to a recent CT scans but new from remote PET-CT scan 03/01/2016. 4. No evidence soft tissue metastasis.     CURRENT THERAPY: Aranesp  injection every 1 to 2 weeks  INTERVAL HISTORY: Michaela Little is a 57 y.o. female who is here for follow-up. She is here alone. She complains of right sided back/rib cage pain with intermittent swelling. Her right costal margin is painful and radiated to her RUQ. She is on a wheelchair and is on O2 cannula. She reports intermittent muscle cramps. She is still coughing and the sputum is clear.  Pertinent positives and negatives of review of systems are listed and detailed within the above HPI.   REVIEW OF SYSTEMS:   Constitutional: Denies fevers, chills or abnormal weight loss Eyes: Denies blurriness of vision Ears, nose, mouth, throat, and face: Denies mucositis or sore throat Respiratory: Denies dyspnea or wheezes (+) on O2 cannula (+) cough Cardiovascular: Denies palpitation, chest discomfort or lower extremity swelling Gastrointestinal:  Denies nausea, heartburn or change in bowel habits (+) RUQ pain from right costal margin Skin: Denies abnormal skin rashes Lymphatics: Denies new lymphadenopathy or easy bruising Neurological:Denies numbness, tingling or new weaknesses Behavioral/Psych: Mood is stable, no new changes  MSK: (+) right sided middle back pain  All other systems were reviewed with the patient and are negative.  MEDICAL HISTORY:  Past Medical History:  Diagnosis Date  . Anxiety   . Arthritis    hands, knees, hips  . Asthma   .  Bronchitis   . Cancer ([place])   . Cervical stenosis (uterine cervix)   . Disorder of appendix    Enlarged  . Dyspnea on exertion   . GERD (gastroesophageal reflux disease)   . Headache(784.0)   . History of breast cancer    2010--  LEFT  s/p  mastectomy (in [place]) AND CHEMORADIATION--  NO RECURRENCE  . History of cervical dysplasia   . Hyperlipidemia   . Hypertension   . Malignant neoplasm of overlapping sites of left breast in female, estrogen receptor positive ([place]) 03/05/2013  . OSA (obstructive sleep apnea) moderate osa per study   09/2010   CPAP  , NOT USING ON REGULAR BASIS  . Pelvic pain in female   . Positive H. pylori test    08-05-2013  . Positive TB test    [place] TEEN--  [place] W/ MEDS  . Refusal of blood transfusions as patient is Jehovah's Witness   . Seasonal allergies   . Uterine fibroid   . Wears glasses     SURGICAL HISTORY: Past Surgical History:  Procedure Laterality Date  . APPENDECTOMY  y  . CARDIOVASCULAR STRESS TEST  10-29-2012   low risk perfusion study/  no significant reversibity/ ef 66%/  normal wall motion  . CERVICAL CONIZATION W/BX  2012   in  [place] N/A 05/14/2012   Procedure: LAPAROSCOPIC CHOLECYSTECTOMY;  Surgeon: Ralene Ok, MD;  Location: [facility];  Service: General;  Laterality: N/A;  . COLONOSCOPY  2011   normal per patient - [place]  . DILATION AND CURETTAGE OF UTERUS  10/15/2011   Procedure: DILATATION AND CURETTAGE;  Surgeon: Melina Schools, MD;  Location: [facility];  Service: Gynecology;  Laterality: N/A;  Conization &  endocervical curettings  . EXAMINATION UNDER ANESTHESIA N/A 08/20/2013   Procedure: EXAM UNDER ANESTHESIA;  Surgeon: Margarette Asal, MD;  Location[facility];  Service: Gynecology;  Laterality: N/A;  . [place]   right  . LAPAROSCOPIC APPENDECTOMY N/A 08/22/2017   Procedure: [place];  Surgeon: Coralie Keens, MD;  Location: [facility];  Service: General;  Laterality: N/A;  . LAPAROSCOPIC ASSISTED VAGINAL HYSTERECTOMY N/A 10/26/2014   Procedure: HYSTERECTOMY ABDOMINAL ;  Surgeon: Molli Posey, MD;  Location: [facility];  Service: Gynecology;  Laterality: N/A;  . MASTECTOMY Left 11/2008  in [place]   . SALPINGOOPHORECTOMY Bilateral 10/26/2014   Procedure: BILATERAL SALPINGO OOPHORECTOMY;  Surgeon: Molli Posey, MD;  Location: [facility];  Service: Gynecology;  Laterality: Bilateral;  . TISSUE EXPANDER PLACEMENT Left 08/22/2017   Procedure: REMOVAL OF LEFT TISSUE EXPANDER;  Surgeon: Crissie Reese, MD;  Location: [facility];  Service: Plastics;  Laterality: Left;  . TISSUE EXPANDER REMOVAL Left 08/22/2017  . TRANSTHORACIC ECHOCARDIOGRAM  10-29-2012   mild lvh/  ef 60-65%/  grade II diastolic dysfunction/  trivial mr  &  tr    I have reviewed the social history and family history with the patient and they are unchanged from previous note.  ALLERGIES:  is allergic to lisinopril-hydrochlorothiazide; adhesive [tape]; fentanyl; gabapentin; hctz [hydrochlorothiazide]; latex; lisinopril; losartan potassium; other; oxycodone; and tums [calcium carbonate antacid].  MEDICATIONS:  Current Outpatient Medications  Medication Sig Dispense Refill  . carbamide peroxide (DEBROX) 6.5 % OTIC solution Place 5 drops into both ears 2 (two) times daily. 15 mL 0  . cetirizine (ZYRTEC) 10 MG tablet Take 10 mg by mouth daily.  11  . chlorpheniramine-HYDROcodone (  TUSSIONEX PENNKINETIC ER) 10-8 MG/5ML SUER Take 5 mLs by mouth every 12 (twelve) hours as needed for cough. 230 mL 0  . diphenhydrAMINE (BENADRYL) 25 mg capsule Take 1 capsule (25 mg total) by mouth every 6 (six) hours as needed for itching. 30 capsule 0  . fluticasone furoate-vilanterol (BREO ELLIPTA) 100-25 MCG/INH AEPB Inhale 1 puff into the lungs daily. 28 each 5  . folic acid (FOLVITE) 1 MG tablet Take 1 tablet (1 mg total) by mouth daily. 30 tablet 0  . furosemide (LASIX) 20 MG tablet Take 1 tablet (20 mg total) by mouth daily as needed (for fluid retention.). 30 tablet 5  . HYDROcodone-homatropine (HYCODAN) 5-1.5 MG/5ML syrup Take 5 mLs by mouth every 6 (six) hours as needed for cough. 120 mL 0  . ipratropium-albuterol (DUONEB) 0.5-2.5 (3) MG/3ML SOLN INHALE THREE MLS VIA NEBULIZER EVERY 6 HOURS AS NEEDED 360 mL 5  . montelukast (SINGULAIR) 10 MG tablet Take 1 tablet (10 mg total) by mouth at bedtime. 30 tablet 5  . pantoprazole (PROTONIX) 40 MG tablet Take 1 tablet (40 mg total) by mouth daily. Take 30-60 min before first meal of the day 30  tablet 2  . promethazine (PHENERGAN) 25 MG tablet Take 1 tablet (25 mg total) by mouth every 6 (six) hours as needed for nausea or vomiting. 30 tablet 0  . vitamin B-12 1000 MCG tablet Take 1 tablet (1,000 mcg total) by mouth daily. 30 tablet 0  . exemestane (AROMASIN) 25 MG tablet Take 1 tablet (25 mg total) by mouth daily after breakfast. (Patient not taking: Reported on 02/24/2018) 90 tablet 1   No current facility-administered medications for this visit.    Facility-Administered Medications Ordered in Other Visits  Medication Dose Route Frequency Provider Last Rate Last Dose  . 0.9 %  sodium chloride infusion   Intravenous Once [name], MD        PHYSICAL EXAMINATION: ECOG PERFORMANCE STATUS: 3 - Symptomatic, >50% confined to bed  Vitals:   02/24/18 1010  BP: (!) 142/71  Pulse: 88  Resp: 18  Temp: 98.2 F (36.8 C)  SpO2: 97%   Filed Weights   02/24/18 1010  Weight: 243 lb 3.2 oz (110.3 kg)    GENERAL:alert, no distress and comfortable (+) on wheelchair SKIN: skin color, texture, turgor are normal, no rashes or significant lesions EYES: normal, Conjunctiva are pink and non-injected, sclera clear OROPHARYNX:no exudate, no erythema and lips, buccal mucosa, and tongue normal  NECK: supple, thyroid normal size, non-tender, without nodularity LYMPH:  no palpable lymphadenopathy in the cervical, axillary or inguinal LUNGS: clear to auscultation and percussion with normal breathing effort (+) on O2 cannula (+) cough HEART: regular rate & rhythm and no murmurs and no lower extremity edema ABDOMEN:abdomen soft, non-tender and normal bowel sounds Musculoskeletal:no cyanosis of digits and no clubbing (+) right coastal margin pain, radiated to RUQ NEURO: alert & oriented x 3 with fluent speech, no focal motor/sensory deficits  LABORATORY DATA:  I have reviewed the data as listed CBC Latest Ref Rng & Units 02/24/2018 02/18/2018 02/11/2018  WBC 4.0 - 10.5 K/uL 4.1 3.9(L) 3.5(L)    Hemoglobin 12.0 - 15.0 g/dL 5.5(LL) 5.0(LL) 4.9(LL)  Hematocrit 36.0 - 46.0 % 19.0(L) 17.0(L) 17.4(L)  Platelets 150 - 400 K/uL 85(L) 103(L) 103(L)     CMP Latest Ref Rng & Units 02/24/2018 02/11/2018 01/06/2018  Glucose 70 - 99 mg/dL 177(H) 146(H) 157(H)  BUN 6 - 20 mg/dL _0 Creatinine 0.44 -  1.00 mg/dL 0.77 0.71 0.71  Sodium 135 - 145 mmol/L 141 142 141  Potassium 3.5 - 5.1 mmol/L 3.5 3.7 3.7  Chloride 98 - 111 mmol/L 106 108 108  CO2 22 - 32 mmol/L _0 Calcium 8.9 - 10.3 mg/dL 9.6 9.5 8.8(L)  Total Protein 6.5 - 8.1 g/dL 7.1 7.2 7.6  Total Bilirubin 0.3 - 1.2 mg/dL 1.7(H) 1.9(H) 2.3(H)  Alkaline Phos 38 - 126 U/L 134(H) 133(H) 132(H)  AST 15 - 41 U/L 23 21 38  ALT 0 - 44 U/L _1 RADIOGRAPHIC STUDIES: I have personally reviewed the radiological images as listed and agreed with the findings in the report. No results found.   ASSESSMENT & PLAN:  KIRAT MEZQUITA is a 57 y.o. female with history of  1. 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 -Unfortunately she developed bone metastasis 1 year after she stopped anastrozole.  She has been on AI and Ibrance since then, Ibrance stopped in 11/2017 due to severe anemia. She also stopped exemestane in late November 2019, due to rib pain. -Monthly Xgeva injection has been held since September 2019 when she developed severe anemia  -I discussed her FO genomic test results. 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 I again discussed the benefits and potential side effects with her, and encouraged her to consider.  We discussed the small risk 5% of anemia, she is reluctant to start due to the concerns of AEs -Labs reviewed, CBC showed Hg 5.5 PLT  85K.  Her anemia has been slowly improving. -We discussed fulvestrant injection. She will think about it. -I will change her Aranesp injection to 542mg every 2 weeks. She agrees.    3. Severe anemia  -She developed worsening anemia in September 2019, lowest hemoglobin 3.9, likely secondary to [place] diffuse bone metastasis  -She is a Jehovah witness, refuses blood transfusion -She has been on Aranesp injection since then, anemia slowly improving -Her vitB was 254 in 12/2017, received one dose B12 injection  -will continue B12 injection monthly    4. Cough and Dyspnea -f/u with pulmonologist Dr. WMelvyn Novas-on Flovent -I previously prescribed Hycodan, which relieved her cough  5. Right LE DVT - She was advised to restarted Lovenox 857m but stopped on her own due to the concerns of bleeding   6. Goal of care discussion  -she is full code now.  -We have discussed that her cancer is incurable at this stage, she understands the goal of care is palliative.  Plan  -Will change Aranesp injection to 5009mevery 2 weeks -restart B12 injection monthly in 2 weeks  -f/u on 12/27 -Lab and injection in 2 weeks and 12/27  -she will think about her breast cancer treatment options fulvestrant and Piqray   No problem-specific Assessment & Plan notes found for this encounter.   No orders of the defined types were placed in this encounter.  All questions were answered. The patient knows to call the clinic with any problems, questions or concerns. No barriers to learning was detected. I spent 20 minutes counseling the patient face to face. The total time spent in the appointment was 25 minutes and more than 50% was on counseling and review of test results  Dierdre Searles Dweik am acting as scribe for Dr. [name].  I have reviewed the above documentation for accuracy and completeness, and I agree with the above.      [name], MD 02/24/2018

## 2018-02-24 ENCOUNTER — Inpatient Hospital Stay: Payer: Medicare Other | Attending: Hematology

## 2018-02-24 ENCOUNTER — Encounter: Payer: Self-pay | Admitting: Hematology

## 2018-02-24 ENCOUNTER — Inpatient Hospital Stay (HOSPITAL_BASED_OUTPATIENT_CLINIC_OR_DEPARTMENT_OTHER): Payer: Medicare Other | Admitting: Hematology

## 2018-02-24 ENCOUNTER — Telehealth: Payer: Self-pay

## 2018-02-24 ENCOUNTER — Inpatient Hospital Stay: Payer: Medicare Other

## 2018-02-24 VITALS — BP 142/71 | HR 88 | Temp 98.2°F | Resp 18 | Ht 67.0 in | Wt 243.2 lb

## 2018-02-24 DIAGNOSIS — R0781 Pleurodynia: Secondary | ICD-10-CM | POA: Insufficient documentation

## 2018-02-24 DIAGNOSIS — I82401 Acute embolism and thrombosis of unspecified deep veins of right lower extremity: Secondary | ICD-10-CM | POA: Insufficient documentation

## 2018-02-24 DIAGNOSIS — C50912 Malignant neoplasm of unspecified site of left female breast: Secondary | ICD-10-CM

## 2018-02-24 DIAGNOSIS — M545 Low back pain: Secondary | ICD-10-CM

## 2018-02-24 DIAGNOSIS — R05 Cough: Secondary | ICD-10-CM

## 2018-02-24 DIAGNOSIS — R252 Cramp and spasm: Secondary | ICD-10-CM

## 2018-02-24 DIAGNOSIS — C785 Secondary malignant neoplasm of large intestine and rectum: Secondary | ICD-10-CM

## 2018-02-24 DIAGNOSIS — C7951 Secondary malignant neoplasm of bone: Secondary | ICD-10-CM | POA: Insufficient documentation

## 2018-02-24 DIAGNOSIS — Z170 Estrogen receptor positive status [ER+]: Secondary | ICD-10-CM | POA: Diagnosis not present

## 2018-02-24 DIAGNOSIS — D649 Anemia, unspecified: Secondary | ICD-10-CM | POA: Insufficient documentation

## 2018-02-24 DIAGNOSIS — Z7189 Other specified counseling: Secondary | ICD-10-CM

## 2018-02-24 DIAGNOSIS — R1011 Right upper quadrant pain: Secondary | ICD-10-CM | POA: Insufficient documentation

## 2018-02-24 DIAGNOSIS — C50812 Malignant neoplasm of overlapping sites of left female breast: Secondary | ICD-10-CM | POA: Diagnosis not present

## 2018-02-24 LAB — CBC WITH DIFFERENTIAL/PLATELET
Abs Immature Granulocytes: 0.23 10*3/uL — ABNORMAL HIGH (ref 0.00–0.07)
BASOS PCT: 1 %
Basophils Absolute: 0.1 10*3/uL (ref 0.0–0.1)
EOS ABS: 0.1 10*3/uL (ref 0.0–0.5)
EOS PCT: 2 %
HEMATOCRIT: 19 % — AB (ref 36.0–46.0)
HEMOGLOBIN: 5.5 g/dL — AB (ref 12.0–15.0)
Immature Granulocytes: 6 %
Lymphocytes Relative: 35 %
Lymphs Abs: 1.4 10*3/uL (ref 0.7–4.0)
MCH: 28.2 pg (ref 26.0–34.0)
MCHC: 28.9 g/dL — ABNORMAL LOW (ref 30.0–36.0)
MCV: 97.4 fL (ref 80.0–100.0)
MONO ABS: 0.3 10*3/uL (ref 0.1–1.0)
MONOS PCT: 8 %
NEUTROS ABS: 2 10*3/uL (ref 1.7–7.7)
Neutrophils Relative %: 48 %
PLATELETS: 85 10*3/uL — AB (ref 150–400)
RBC: 1.95 MIL/uL — AB (ref 3.87–5.11)
RDW: 19.8 % — ABNORMAL HIGH (ref 11.5–15.5)
WBC: 4.1 10*3/uL (ref 4.0–10.5)
nRBC: 1.7 % — ABNORMAL HIGH (ref 0.0–0.2)

## 2018-02-24 LAB — CMP (CANCER CENTER ONLY)
ALBUMIN: 3.4 g/dL — AB (ref 3.5–5.0)
ALK PHOS: 134 U/L — AB (ref 38–126)
ALT: 17 U/L (ref 0–44)
AST: 23 U/L (ref 15–41)
Anion gap: 13 (ref 5–15)
BILIRUBIN TOTAL: 1.7 mg/dL — AB (ref 0.3–1.2)
BUN: 9 mg/dL (ref 6–20)
CO2: 22 mmol/L (ref 22–32)
Calcium: 9.6 mg/dL (ref 8.9–10.3)
Chloride: 106 mmol/L (ref 98–111)
Creatinine: 0.77 mg/dL (ref 0.44–1.00)
GFR, Est AFR Am: >60 mL/min (ref >60)
GFR, Estimated: >60 mL/min (ref >60)
GLUCOSE: 177 mg/dL — AB (ref 70–99)
POTASSIUM: 3.5 mmol/L (ref 3.5–5.1)
Sodium: 141 mmol/L (ref 135–145)
TOTAL PROTEIN: 7.1 g/dL (ref 6.5–8.1)

## 2018-02-24 MED ORDER — DARBEPOETIN ALFA 300 MCG/0.6ML IJ SOSY
300.0000 ug | PREFILLED_SYRINGE | Freq: Once | INTRAMUSCULAR | Status: AC
  Administered 2018-02-24: 300 ug via SUBCUTANEOUS

## 2018-02-24 MED ORDER — DARBEPOETIN ALFA 300 MCG/0.6ML IJ SOSY
PREFILLED_SYRINGE | INTRAMUSCULAR | Status: AC
  Filled 2018-02-24: qty 0.6

## 2018-02-24 NOTE — Telephone Encounter (Signed)
Printed avs and calender of upcoming appointment. Per 12/2 los 

## 2018-02-27 ENCOUNTER — Emergency Department (HOSPITAL_COMMUNITY): Payer: Medicare Other

## 2018-02-27 ENCOUNTER — Other Ambulatory Visit: Payer: Self-pay

## 2018-02-27 ENCOUNTER — Encounter (HOSPITAL_COMMUNITY): Payer: Self-pay | Admitting: Emergency Medicine

## 2018-02-27 ENCOUNTER — Inpatient Hospital Stay (HOSPITAL_COMMUNITY)
Admission: EM | Admit: 2018-02-27 | Discharge: 2018-03-08 | DRG: 167 | Disposition: A | Discharge status: Home Health | Payer: Medicare Other | Attending: Internal Medicine | Admitting: Internal Medicine

## 2018-02-27 ENCOUNTER — Telehealth: Payer: Self-pay

## 2018-02-27 ENCOUNTER — Other Ambulatory Visit: Payer: Medicare Other | Admitting: Primary Care

## 2018-02-27 DIAGNOSIS — I8290 Acute embolism and thrombosis of unspecified vein: Secondary | ICD-10-CM

## 2018-02-27 DIAGNOSIS — K219 Gastro-esophageal reflux disease without esophagitis: Secondary | ICD-10-CM | POA: Diagnosis present

## 2018-02-27 DIAGNOSIS — R51 Headache: Secondary | ICD-10-CM | POA: Diagnosis not present

## 2018-02-27 DIAGNOSIS — D696 Thrombocytopenia, unspecified: Secondary | ICD-10-CM

## 2018-02-27 DIAGNOSIS — I2693 Single subsegmental pulmonary embolism without acute cor pulmonale: Secondary | ICD-10-CM

## 2018-02-27 DIAGNOSIS — C50912 Malignant neoplasm of unspecified site of left female breast: Secondary | ICD-10-CM

## 2018-02-27 DIAGNOSIS — R509 Fever, unspecified: Secondary | ICD-10-CM | POA: Diagnosis not present

## 2018-02-27 DIAGNOSIS — M25551 Pain in right hip: Secondary | ICD-10-CM | POA: Diagnosis not present

## 2018-02-27 DIAGNOSIS — E785 Hyperlipidemia, unspecified: Secondary | ICD-10-CM | POA: Diagnosis present

## 2018-02-27 DIAGNOSIS — I10 Essential (primary) hypertension: Secondary | ICD-10-CM | POA: Diagnosis present

## 2018-02-27 DIAGNOSIS — I82401 Acute embolism and thrombosis of unspecified deep veins of right lower extremity: Secondary | ICD-10-CM | POA: Diagnosis not present

## 2018-02-27 DIAGNOSIS — M1990 Unspecified osteoarthritis, unspecified site: Secondary | ICD-10-CM | POA: Diagnosis present

## 2018-02-27 DIAGNOSIS — I370 Nonrheumatic pulmonary valve stenosis: Secondary | ICD-10-CM | POA: Diagnosis not present

## 2018-02-27 DIAGNOSIS — R059 Cough, unspecified: Secondary | ICD-10-CM

## 2018-02-27 DIAGNOSIS — Z79899 Other long term (current) drug therapy: Secondary | ICD-10-CM

## 2018-02-27 DIAGNOSIS — Z170 Estrogen receptor positive status [ER+]: Secondary | ICD-10-CM

## 2018-02-27 DIAGNOSIS — Z885 Allergy status to narcotic agent status: Secondary | ICD-10-CM

## 2018-02-27 DIAGNOSIS — C50919 Malignant neoplasm of unspecified site of unspecified female breast: Secondary | ICD-10-CM

## 2018-02-27 DIAGNOSIS — Z888 Allergy status to other drugs, medicaments and biological substances status: Secondary | ICD-10-CM

## 2018-02-27 DIAGNOSIS — I82441 Acute embolism and thrombosis of right tibial vein: Secondary | ICD-10-CM | POA: Diagnosis present

## 2018-02-27 DIAGNOSIS — J449 Chronic obstructive pulmonary disease, unspecified: Secondary | ICD-10-CM | POA: Diagnosis present

## 2018-02-27 DIAGNOSIS — Z515 Encounter for palliative care: Secondary | ICD-10-CM

## 2018-02-27 DIAGNOSIS — D6481 Anemia due to antineoplastic chemotherapy: Secondary | ICD-10-CM

## 2018-02-27 DIAGNOSIS — C786 Secondary malignant neoplasm of retroperitoneum and peritoneum: Secondary | ICD-10-CM | POA: Diagnosis present

## 2018-02-27 DIAGNOSIS — I2699 Other pulmonary embolism without acute cor pulmonale: Principal | ICD-10-CM | POA: Diagnosis present

## 2018-02-27 DIAGNOSIS — I82411 Acute embolism and thrombosis of right femoral vein: Secondary | ICD-10-CM | POA: Diagnosis present

## 2018-02-27 DIAGNOSIS — C7951 Secondary malignant neoplasm of bone: Secondary | ICD-10-CM | POA: Diagnosis present

## 2018-02-27 DIAGNOSIS — T451X5A Adverse effect of antineoplastic and immunosuppressive drugs, initial encounter: Secondary | ICD-10-CM | POA: Diagnosis not present

## 2018-02-27 DIAGNOSIS — I824Y1 Acute embolism and thrombosis of unspecified deep veins of right proximal lower extremity: Secondary | ICD-10-CM | POA: Diagnosis not present

## 2018-02-27 DIAGNOSIS — G4733 Obstructive sleep apnea (adult) (pediatric): Secondary | ICD-10-CM | POA: Diagnosis present

## 2018-02-27 DIAGNOSIS — R5381 Other malaise: Secondary | ICD-10-CM | POA: Diagnosis present

## 2018-02-27 DIAGNOSIS — R600 Localized edema: Secondary | ICD-10-CM | POA: Diagnosis not present

## 2018-02-27 DIAGNOSIS — I82431 Acute embolism and thrombosis of right popliteal vein: Secondary | ICD-10-CM | POA: Diagnosis present

## 2018-02-27 DIAGNOSIS — D630 Anemia in neoplastic disease: Secondary | ICD-10-CM | POA: Diagnosis present

## 2018-02-27 DIAGNOSIS — Z531 Procedure and treatment not carried out because of patient's decision for reasons of belief and group pressure: Secondary | ICD-10-CM | POA: Diagnosis present

## 2018-02-27 DIAGNOSIS — Z91048 Other nonmedicinal substance allergy status: Secondary | ICD-10-CM

## 2018-02-27 DIAGNOSIS — I82409 Acute embolism and thrombosis of unspecified deep veins of unspecified lower extremity: Secondary | ICD-10-CM | POA: Diagnosis not present

## 2018-02-27 DIAGNOSIS — D6959 Other secondary thrombocytopenia: Secondary | ICD-10-CM | POA: Diagnosis present

## 2018-02-27 DIAGNOSIS — S72009A Fracture of unspecified part of neck of unspecified femur, initial encounter for closed fracture: Secondary | ICD-10-CM

## 2018-02-27 DIAGNOSIS — Z91040 Latex allergy status: Secondary | ICD-10-CM

## 2018-02-27 DIAGNOSIS — Z87891 Personal history of nicotine dependence: Secondary | ICD-10-CM

## 2018-02-27 DIAGNOSIS — Z9114 Patient's other noncompliance with medication regimen: Secondary | ICD-10-CM

## 2018-02-27 DIAGNOSIS — Z9012 Acquired absence of left breast and nipple: Secondary | ICD-10-CM

## 2018-02-27 DIAGNOSIS — R05 Cough: Secondary | ICD-10-CM

## 2018-02-27 LAB — CBC
HCT: 20.9 % — ABNORMAL LOW (ref 36.0–46.0)
Hemoglobin: 5.8 g/dL — CL (ref 12.0–15.0)
MCH: 27.5 pg (ref 26.0–34.0)
MCHC: 27.8 g/dL — ABNORMAL LOW (ref 30.0–36.0)
MCV: 99.1 fL (ref 80.0–100.0)
PLATELETS: 71 10*3/uL — AB (ref 150–400)
RBC: 2.11 MIL/uL — ABNORMAL LOW (ref 3.87–5.11)
RDW: 19.7 % — ABNORMAL HIGH (ref 11.5–15.5)
WBC: 5.7 10*3/uL (ref 4.0–10.5)
nRBC: 0 % (ref 0.0–0.2)

## 2018-02-27 LAB — BASIC METABOLIC PANEL
Anion gap: 12 (ref 5–15)
BUN: 11 mg/dL (ref 6–20)
CO2: 22 mmol/L (ref 22–32)
Calcium: 9.9 mg/dL (ref 8.9–10.3)
Chloride: 104 mmol/L (ref 98–111)
Creatinine, Ser: 0.71 mg/dL (ref 0.44–1.00)
GFR calc Af Amer: >60 mL/min (ref >60)
Glucose, Bld: 129 mg/dL — ABNORMAL HIGH (ref 70–99)
Potassium: 3.6 mmol/L (ref 3.5–5.1)
Sodium: 138 mmol/L (ref 135–145)

## 2018-02-27 LAB — I-STAT TROPONIN, ED: Troponin i, poc: 0 ng/mL (ref 0.00–0.08)

## 2018-02-27 LAB — HEMOGLOBIN: Hemoglobin: 5.7 g/dL — CL (ref 12.0–15.0)

## 2018-02-27 LAB — I-STAT BETA HCG BLOOD, ED (MC, WL, AP ONLY): I-stat hCG, quantitative: 6.3 m[IU]/mL — ABNORMAL HIGH (ref <5)

## 2018-02-27 LAB — BRAIN NATRIURETIC PEPTIDE: B Natriuretic Peptide: 15.6 pg/mL (ref 0.0–100.0)

## 2018-02-27 MED ORDER — SODIUM CHLORIDE 0.9% FLUSH: 3.0000 mL | INTRAVENOUS | Status: DC | PRN

## 2018-02-27 MED ORDER — ONDANSETRON HCL 4 MG/2ML IJ SOLN: 4.0000 mg | Freq: Four times a day (QID) | INTRAMUSCULAR | Status: DC | PRN

## 2018-02-27 MED ORDER — SODIUM CHLORIDE 0.9 % IV SOLN
125.0000 mg | Freq: Once | INTRAVENOUS | Status: AC
  Administered 2018-02-28: 125 mg via INTRAVENOUS
  Filled 2018-02-27: qty 10

## 2018-02-27 MED ORDER — PANTOPRAZOLE SODIUM 40 MG PO TBEC
40.0000 mg | DELAYED_RELEASE_TABLET | Freq: Every day | ORAL | Status: DC
  Administered 2018-02-28 – 2018-03-08 (×9): 40 mg via ORAL
  Filled 2018-02-27 (×9): qty 1

## 2018-02-27 MED ORDER — ALBUTEROL SULFATE (2.5 MG/3ML) 0.083% IN NEBU: 2.5000 mg | INHALATION_SOLUTION | RESPIRATORY_TRACT | Status: DC | PRN

## 2018-02-27 MED ORDER — SODIUM CHLORIDE 0.9 % IV SOLN
250.0000 mL | INTRAVENOUS | Status: DC | PRN
  Administered 2018-02-28: 250 mL via INTRAVENOUS

## 2018-02-27 MED ORDER — SODIUM CHLORIDE 0.9% FLUSH
3.0000 mL | Freq: Two times a day (BID) | INTRAVENOUS | Status: DC
  Administered 2018-02-28 – 2018-03-08 (×18): 3 mL via INTRAVENOUS

## 2018-02-27 MED ORDER — EXEMESTANE 25 MG PO TABS: 25.0000 mg | ORAL_TABLET | Freq: Every day | ORAL | Status: DC

## 2018-02-27 MED ORDER — VITAMIN B-12 1000 MCG PO TABS
1000.0000 ug | ORAL_TABLET | Freq: Every day | ORAL | Status: DC
  Administered 2018-02-28 – 2018-03-08 (×9): 1000 ug via ORAL
  Filled 2018-02-27 (×9): qty 1

## 2018-02-27 MED ORDER — IPRATROPIUM-ALBUTEROL 0.5-2.5 (3) MG/3ML IN SOLN
3.0000 mL | Freq: Four times a day (QID) | RESPIRATORY_TRACT | Status: DC
  Administered 2018-02-28: 3 mL via RESPIRATORY_TRACT
  Filled 2018-02-27: qty 3

## 2018-02-27 MED ORDER — PROMETHAZINE HCL 25 MG PO TABS: 25.0000 mg | ORAL_TABLET | Freq: Four times a day (QID) | ORAL | Status: DC | PRN

## 2018-02-27 MED ORDER — FLUTICASONE FUROATE-VILANTEROL 100-25 MCG/INH IN AEPB
1.0000 | INHALATION_SPRAY | Freq: Every day | RESPIRATORY_TRACT | Status: DC
  Administered 2018-02-28 – 2018-03-08 (×9): 1 via RESPIRATORY_TRACT
  Filled 2018-02-27: qty 28

## 2018-02-27 MED ORDER — ONDANSETRON HCL 4 MG PO TABS: 4.0000 mg | ORAL_TABLET | Freq: Four times a day (QID) | ORAL | Status: DC | PRN

## 2018-02-27 MED ORDER — FUROSEMIDE 20 MG PO TABS: 20.0000 mg | ORAL_TABLET | Freq: Every day | ORAL | Status: DC | PRN

## 2018-02-27 MED ORDER — ALBUTEROL SULFATE (2.5 MG/3ML) 0.083% IN NEBU: 2.5000 mg | INHALATION_SOLUTION | Freq: Four times a day (QID) | RESPIRATORY_TRACT | Status: DC

## 2018-02-27 MED ORDER — HYDROCODONE-HOMATROPINE 5-1.5 MG/5ML PO SYRP
5.0000 mL | ORAL_SOLUTION | Freq: Four times a day (QID) | ORAL | Status: DC | PRN
  Administered 2018-02-27 – 2018-03-01 (×3): 5 mL via ORAL
  Filled 2018-02-27 (×3): qty 5

## 2018-02-27 MED ORDER — HEPARIN (PORCINE) 25000 UT/250ML-% IV SOLN
1150.0000 [IU]/h | INTRAVENOUS | Status: DC
  Administered 2018-02-27: 1400 [IU]/h via INTRAVENOUS
  Administered 2018-02-28 – 2018-03-01 (×2): 1300 [IU]/h via INTRAVENOUS
  Filled 2018-02-27 (×2): qty 250

## 2018-02-27 MED ORDER — IOPAMIDOL (ISOVUE-370) INJECTION 76%
75.0000 mL | Freq: Once | INTRAVENOUS | Status: AC | PRN
  Administered 2018-02-27: 75 mL via INTRAVENOUS

## 2018-02-27 MED ORDER — HYDROCOD POLST-CPM POLST ER 10-8 MG/5ML PO SUER: 5.0000 mL | Freq: Two times a day (BID) | ORAL | Status: DC | PRN

## 2018-02-27 MED ORDER — CARBAMIDE PEROXIDE 6.5 % OT SOLN: 5.0000 [drp] | Freq: Two times a day (BID) | OTIC | Status: DC

## 2018-02-27 MED ORDER — IOPAMIDOL (ISOVUE-370) INJECTION 76%
INTRAVENOUS | Status: AC
  Filled 2018-02-27: qty 100

## 2018-02-27 MED ORDER — IPRATROPIUM-ALBUTEROL 0.5-2.5 (3) MG/3ML IN SOLN: 3.0000 mL | Freq: Four times a day (QID) | RESPIRATORY_TRACT | Status: DC | PRN

## 2018-02-27 MED ORDER — FOLIC ACID 1 MG PO TABS
1.0000 mg | ORAL_TABLET | Freq: Every day | ORAL | Status: DC
  Administered 2018-02-28 – 2018-03-08 (×9): 1 mg via ORAL
  Filled 2018-02-27 (×9): qty 1

## 2018-02-27 MED ORDER — DIPHENHYDRAMINE HCL 25 MG PO CAPS: 25.0000 mg | ORAL_CAPSULE | Freq: Four times a day (QID) | ORAL | Status: DC | PRN

## 2018-02-27 MED ORDER — INFLUENZA VAC SPLIT QUAD 0.5 ML IM SUSY: 0.5000 mL | PREFILLED_SYRINGE | INTRAMUSCULAR | Status: DC

## 2018-02-27 MED ORDER — HEPARIN BOLUS VIA INFUSION
4000.0000 [IU] | Freq: Once | INTRAVENOUS | Status: AC
  Administered 2018-02-27: 4000 [IU] via INTRAVENOUS
  Filled 2018-02-27: qty 4000

## 2018-02-27 MED ORDER — MONTELUKAST SODIUM 10 MG PO TABS
10.0000 mg | ORAL_TABLET | Freq: Every day | ORAL | Status: DC
  Administered 2018-03-02 – 2018-03-07 (×6): 10 mg via ORAL
  Filled 2018-02-27 (×8): qty 1

## 2018-02-27 MED ORDER — IPRATROPIUM BROMIDE 0.02 % IN SOLN: 0.5000 mg | Freq: Four times a day (QID) | RESPIRATORY_TRACT | Status: DC

## 2018-02-27 MED ORDER — LORATADINE 10 MG PO TABS
10.0000 mg | ORAL_TABLET | Freq: Every day | ORAL | Status: DC
  Administered 2018-02-28 – 2018-03-03 (×4): 10 mg via ORAL
  Filled 2018-02-27 (×4): qty 1

## 2018-02-27 NOTE — Progress Notes (Signed)
[facility] Telephone: [phone] Fax: [phone]  PATIENT NAME: Michaela Little DOB: 1961-01-23 MRN: [id number]  PRIMARY CARE PROVIDER:   Clent Demark, PA-C   Dr.  Burr Medico- oncology  REFERRING PROVIDER:  Clent Demark, PA-C [place], [place] [address]  RESPONSIBLE PARTY:   Extended Emergency Contact Information Primary Emergency Contact: [place] Address: [address]          [place], [place] [address] [name] of [place] Phone: [phone] Relation: Daughter Secondary Emergency Contact: Renaldo Reel, [place] [address] [place] of [place] Phone: [phone] Relation: Relative   ASSESSMENT and RECOMMENDATIONS :  1. Pain: Taking OTC, will not take tramadol. States 10/10 pain in right chest and right leg. This has exacerbated in past 24 hours. Leg is swollen and pt has h/o DVT starting in groin. Cannot stand today due to pain, ambulated to mail box yesterday.  2. Dyspnea: New onset exacerbation with diminished right base lung sounds. Suspect PE due to pain, dyspnea and persistent cough. Increased oxygen to 4 L while waiting for EMS, previously on  2L.States she wants to wean but encouraged not to do this due to her hgb level.   3.Goals of Care:  Wants to be assessed in ED for these new symptoms. Friend Claudette present, and they will call EMS. Taking erythropoeitin. Has had increase  In hgb this week.  Wants to return to hospital today for r/u DVT.  States she needs to name a POA. Still has not filled out this  paper work. States her daughter  [name] will be POA.   I spent 25 minutes providing this consultation,  from 1030 to 1055. More than 50% of the time in this consultation was spent coordinating communication.   HISTORY OF PRESENT ILLNESS:  Michaela Little is a 57 y.o. year old female with multiple medical problems including breast cancer, h/o DVT,obesity, HTN, HDL, OSA . Palliative Care was asked to  help address goals of care.   CODE STATUS: FULL CODE PPS: 30% HOSPICE ELIGIBILITY/DIAGNOSIS: TBD  PAST MEDICAL HISTORY:  Past Medical History:  Diagnosis Date  . Anxiety   . Arthritis    hands, knees, hips  . Asthma   . Bronchitis   . Cancer ([place])   . Cervical stenosis (uterine cervix)   . Disorder of appendix    Enlarged  . Dyspnea on exertion   . GERD (gastroesophageal reflux disease)   . Headache(784.0)   . History of breast cancer    2010--  LEFT  s/p  mastectomy (in [place]) AND CHEMORADIATION--  NO RECURRENCE  . History of cervical dysplasia   . Hyperlipidemia   . Hypertension   . Malignant neoplasm of overlapping sites of left breast in female, estrogen receptor positive ([place]) 03/05/2013  . OSA (obstructive sleep apnea) moderate osa per study  09/2010   CPAP  , NOT USING ON REGULAR BASIS  . Pelvic pain in female   . Positive H. pylori test    08-05-2013  . Positive TB test    [place] TEEN--  [place] W/ MEDS  . Refusal of blood transfusions as patient is Jehovah's Witness   . Seasonal allergies   . Uterine fibroid   . Wears glasses     SOCIAL HX:  Social History   Tobacco Use  . Smoking status: Former Smoker    Packs/day: 0.30    Years: 10.00    Pack  years: 3.00    Types: Cigarettes    Last attempt to quit: 03/26/1988    Years since quitting: 29.9  . Smokeless tobacco: Never Used  Substance Use Topics  . Alcohol use: No    ALLERGIES:  Allergies  Allergen Reactions  . Lisinopril-Hydrochlorothiazide Itching  . Adhesive [Tape] Itching and Other (See Comments)    Redness  . Fentanyl Other (See Comments)    GI upset and drowsiness  . Gabapentin Other (See Comments)    Auditory hallucinations  . Hctz [Hydrochlorothiazide] Itching  . Latex Itching  . Lisinopril Itching  . Losartan Potassium Other (See Comments)    Makes her feel "bad"   . Other Other (See Comments)    Patient refuses blood for religious reasons (per her notes)  . Oxycodone     GI upset, drowsy   . Tums [Calcium Carbonate Antacid] Hives    FRUIT-FLAVORED ONES     PERTINENT MEDICATIONS:  Outpatient Encounter Medications as of 02/27/2018  Medication Sig  . carbamide peroxide (DEBROX) 6.5 % OTIC solution Place 5 drops into both ears 2 (two) times daily.  . cetirizine (ZYRTEC) 10 MG tablet Take 10 mg by mouth daily.  . chlorpheniramine-HYDROcodone (TUSSIONEX PENNKINETIC ER) 10-8 MG/5ML SUER Take 5 mLs by mouth every 12 (twelve) hours as needed for cough.  . diphenhydrAMINE (BENADRYL) 25 mg capsule Take 1 capsule (25 mg total) by mouth every 6 (six) hours as needed for itching.  Marland Kitchen exemestane (AROMASIN) 25 MG tablet Take 1 tablet (25 mg total) by mouth daily after breakfast. (Patient not taking: Reported on 02/24/2018)  . fluticasone furoate-vilanterol (BREO ELLIPTA) 100-25 MCG/INH AEPB Inhale 1 puff into the lungs daily.  . folic acid (FOLVITE) 1 MG tablet Take 1 tablet (1 mg total) by mouth daily.  . furosemide (LASIX) 20 MG tablet Take 1 tablet (20 mg total) by mouth daily as needed (for fluid retention.).  Marland Kitchen HYDROcodone-homatropine (HYCODAN) 5-1.5 MG/5ML syrup Take 5 mLs by mouth every 6 (six) hours as needed for cough.  Marland Kitchen ipratropium-albuterol (DUONEB) 0.5-2.5 (3) MG/3ML SOLN INHALE THREE MLS VIA NEBULIZER EVERY 6 HOURS AS NEEDED  . montelukast (SINGULAIR) 10 MG tablet Take 1 tablet (10 mg total) by mouth at bedtime.  . pantoprazole (PROTONIX) 40 MG tablet Take 1 tablet (40 mg total) by mouth daily. Take 30-60 min before first meal of the day  . promethazine (PHENERGAN) 25 MG tablet Take 1 tablet (25 mg total) by mouth every 6 (six) hours as needed for nausea or vomiting.  . vitamin B-12 1000 MCG tablet Take 1 tablet (1,000 mcg total) by mouth daily.   Facility-Administered Encounter Medications as of 02/27/2018  Medication  . 0.9 %  sodium chloride infusion    PHYSICAL EXAM:  VS 130/80 [id number]  Pulse oximeter unavailable. General: NAD, ill- appearing,obese Cardiovascular:  regular rate and rhythm, S1S2 Pulmonary: clear fields on left, reduced air movement in right lower lobe. Pain in right side. Abdomen: soft, nontender, + bowel sounds, eating  GU: reports pain in supra pubic area.  Extremities: no edema, no joint deformities Skin: no rashes, wounds Neurological: Weakness, unable to ambulate 50 ft without great effort and DOE. No change in level of consciousness.  Cyndia Skeeters DNP, AGPCNP-BC

## 2018-02-27 NOTE — Consult Note (Signed)
NAME:  Michaela Little, MRN:  [id number], DOB:  10-21-1960, LOS: 0 ADMISSION DATE:  02/27/2018, CONSULTATION DATE:  12/5 REFERRING MD:  Laren Everts, CHIEF COMPLAINT:  PE, jehovah's witness    Brief History   57yo female [place] witness with hx HTN, OSA, obesity, stage IIIa metastatic breast cancer, previous DVT with course c/b bleeding (hgb down to 4 - unable to give blood products r/t JW) and anticoagulation was stopped who presents 12/5 with dyspnea, cough and pleuritic pain. ER w/u revealed mod sized R PE and PCCM consulted to assist with complicated situation regarding anticoagulation and new PE.    History of present illness   57yo female [place] witness with hx HTN, OSA, obesity, stage IIIa metastatic breast cancer, previous DVT with course c/b bleeding (hgb down to 4 - unable to give blood products r/t JW) and anticoagulation was stopped who presents 12/5 with dyspnea, cough and pleuritic pain. ER w/u revealed mod sized R PE and PCCM consulted to assist with complicated situation regarding anticoagulation and new PE.   Past Medical History   has a past medical history of Anxiety, Arthritis, Asthma, Bronchitis, Cancer ([place]), Cervical stenosis (uterine cervix), Disorder of appendix, Dyspnea on exertion, GERD (gastroesophageal reflux disease), Headache(784.0), History of breast cancer, History of cervical dysplasia, Hyperlipidemia, Hypertension, Malignant neoplasm of overlapping sites of left breast in female, estrogen receptor positive ([place]) (03/05/2013), OSA (obstructive sleep apnea) (moderate osa per study  09/2010), Pelvic pain in female, Positive H. pylori test, Positive TB test, Refusal of blood transfusions as patient is Jehovah's Witness, Seasonal allergies, Uterine fibroid, and Wears glasses.   Significant Hospital Events     Consults:    Procedures:    Significant Diagnostic Tests:  CTA chest 12/5>>> 1. Moderate sized pulmonary emboli on the right without evidence of right  heart strain. 2. Progressive bony metastatic disease 3. Minimal pericardial effusion.  Micro Data:    Antimicrobials:     Interim history/subjective:  Currently feeling better.  Some mild dyspnea but improved. Denies chest pain.    Objective   Blood pressure 132/73, pulse 92, resp. rate 20, weight 110.2 kg, last menstrual period 02/25/2009, SpO2 100 %.       No intake or output data in the 24 hours ending 02/27/18 1634 Filed Weights   02/27/18 1231  Weight: 110.2 kg    Examination: General: pleasant chronically ill appearing female, NAD  HENT: mm moist, slightly pale, no JVD  Lungs: resps even non labored on [place], diminished bases otherwise ess clear  Cardiovascular:  s1s2 rrr Abdomen: round, soft, non tender  Extremities: warm and dry, 1+ BLE edema  Neuro: awake, alert, appropriate    Resolved Hospital Problem list     Assessment & Plan:   PE - in setting malignancy.  Hx DVT - previously on lovenox, stopped r/t worsening anemia.  Pt was apparently told to restart but stopped on her own due to risk of bleeding.  Acute on chronic anemia - hgb 5.8 today but stable.   Jehovah's witness  Metastatic breast cancer - worsening metastasis.  Incurable with goal being palliation per heme/onc but pt remains full code   PLAN -  Admit per hospitalist  Does not need ICU monitoring  Heparin gtt per pharmacy  Close monitoring of CBC  BLE venous dopplers  Echo  See discussion below   Discussed at length with patient at bedside.  Discussed risks and benefits of proceeding with anticoagulation as well as risks/benefits of potentially not starting anticoagulation  in setting acute PE.  She states that she is "not in denial" about her cancer but is "waiting for God to intervene" and in the meantime wants to do whatever we need to do to save her life.  She is willing to proceed with anticoagulation with close monitoring of her hemoglobin and even stated that if she has bleeding  complications she "may have to make a decision about getting some blood" so unclear as to whether she would accept a transfusion or not.    Labs   CBC: Recent Labs  Lab 02/24/18 0948 02/27/18 1237  WBC 4.1 5.7  NEUTROABS 2.0  --   HGB 5.5* 5.8*  HCT 19.0* 20.9*  MCV 97.4 99.1  PLT 85* 71*    Basic Metabolic Panel: Recent Labs  Lab 02/24/18 0948 02/27/18 1237  NA 141 138  K 3.5 3.6  CL 106 104  CO2 22 22  GLUCOSE 177* 129*  BUN 9 11  CREATININE 0.77 0.71  CALCIUM 9.6 9.9   GFR: Estimated Creatinine Clearance: 96.8 mL/min (by C-G formula based on SCr of 0.71 mg/dL). Recent Labs  Lab 02/24/18 0948 02/27/18 1237  WBC 4.1 5.7    Liver Function Tests: Recent Labs  Lab 02/24/18 0948  AST 23  ALT 17  ALKPHOS 134*  BILITOT 1.7*  PROT 7.1  ALBUMIN 3.4*   No results for input(s): LIPASE, AMYLASE in the last 168 hours. No results for input(s): AMMONIA in the last 168 hours.  ABG    Component Value Date/Time   TCO2 27 11/19/2013 1302   O2SAT 68.5 12/03/2015 0110     Coagulation Profile: No results for input(s): INR, PROTIME in the last 168 hours.  Cardiac Enzymes: No results for input(s): CKTOTAL, CKMB, CKMBINDEX, TROPONINI in the last 168 hours.  HbA1C: No results found for: HGBA1C  CBG: No results for input(s): GLUCAP in the last 168 hours.  Review of Systems:   As per HPI - All other systems reviewed and were neg.     Past Medical History  She,  has a past medical history of Anxiety, Arthritis, Asthma, Bronchitis, Cancer ([place]), Cervical stenosis (uterine cervix), Disorder of appendix, Dyspnea on exertion, GERD (gastroesophageal reflux disease), Headache(784.0), History of breast cancer, History of cervical dysplasia, Hyperlipidemia, Hypertension, Malignant neoplasm of overlapping sites of left breast in female, estrogen receptor positive ([place]) (03/05/2013), OSA (obstructive sleep apnea) (moderate osa per study  09/2010), Pelvic pain in female,  Positive H. pylori test, Positive TB test, Refusal of blood transfusions as patient is Jehovah's Witness, Seasonal allergies, Uterine fibroid, and Wears glasses.   Surgical History    Past Surgical History:  Procedure Laterality Date  . APPENDECTOMY  y  . CARDIOVASCULAR STRESS TEST  10-29-2012   low risk perfusion study/  no significant reversibity/ ef 66%/  normal wall motion  . CERVICAL CONIZATION W/BX  2012   in  [place] N/A 05/14/2012   Procedure: LAPAROSCOPIC CHOLECYSTECTOMY;  Surgeon: Ralene Ok, MD;  Location: [facility];  Service: General;  Laterality: N/A;  . COLONOSCOPY  2011   normal per patient - [place]  . DILATION AND CURETTAGE OF UTERUS  10/15/2011   Procedure: DILATATION AND CURETTAGE;  Surgeon: Melina Schools, MD;  Location: [facility];  Service: Gynecology;  Laterality: N/A;  Conization &  endocervical curettings  . EXAMINATION UNDER ANESTHESIA N/A 08/20/2013   Procedure: EXAM UNDER ANESTHESIA;  Surgeon: Margarette Asal, MD;  Location: [facility];  Service: Gynecology;  Laterality: N/A;  . [place]   right  . LAPAROSCOPIC APPENDECTOMY N/A 08/22/2017   Procedure: [place];  Surgeon: Coralie Keens, MD;  Location: [facility];  Service: General;  Laterality: N/A;  . LAPAROSCOPIC ASSISTED VAGINAL HYSTERECTOMY N/A 10/26/2014   Procedure: HYSTERECTOMY ABDOMINAL ;  Surgeon: Molli Posey, MD;  Location: [facility];  Service: Gynecology;  Laterality: N/A;  . MASTECTOMY Left 11/2008  in [place]   . SALPINGOOPHORECTOMY Bilateral 10/26/2014   Procedure: BILATERAL SALPINGO OOPHORECTOMY;  Surgeon: Molli Posey, MD;  Location: [facility];  Service: Gynecology;  Laterality: Bilateral;  . TISSUE EXPANDER PLACEMENT Left 08/22/2017   Procedure: REMOVAL OF LEFT TISSUE EXPANDER;  Surgeon: Crissie Reese, MD;  Location: [facility];  Service: Plastics;  Laterality: Left;  . TISSUE EXPANDER REMOVAL Left 08/22/2017  . TRANSTHORACIC  ECHOCARDIOGRAM  10-29-2012   mild lvh/  ef 60-65%/  grade II diastolic dysfunction/  trivial mr  &  tr     Social History   reports that she quit smoking about 29 years ago. Her smoking use included cigarettes. She has a 3.00 pack-year smoking history. She has never used smokeless tobacco. She reports that she does not drink alcohol or use drugs.   Family History   Her family history includes Arthritis in her mother; Asthma in her mother; Breast cancer in her mother; Cancer in her maternal aunt, maternal uncle, and mother; Cerebral palsy in her daughter; Clotting disorder in her mother; Colon cancer in her maternal aunt, maternal uncle, and mother; Diabetes type II in her mother; Emphysema in her brother; Heart disease in her brother; Hypotension in her mother; Mental illness in her brother. There is no history of Esophageal cancer, Rectal cancer, Stomach cancer, or Thyroid disease.   Allergies Allergies  Allergen Reactions  . Lisinopril-Hydrochlorothiazide Itching  . Adhesive [Tape] Itching and Other (See Comments)    Redness  . Fentanyl Other (See Comments)    GI upset and drowsiness  . Gabapentin Other (See Comments)    Auditory hallucinations  . Hctz [Hydrochlorothiazide] Itching  . Latex Itching  . Lisinopril Itching  . Losartan Potassium Other (See Comments)    Makes her feel "bad"   . Other Other (See Comments)    Patient refuses blood for religious reasons (per her notes)  . Oxycodone     GI upset, drowsy  . Tums [Calcium Carbonate Antacid] Hives    FRUIT-FLAVORED ONES     Home Medications  Prior to Admission medications   Medication Sig Start Date End Date Taking? Authorizing Provider  cetirizine (ZYRTEC) 10 MG tablet Take 10 mg by mouth daily. 12/19/17  Yes [provider]  fluticasone furoate-vilanterol (BREO ELLIPTA) 100-25 MCG/INH AEPB Inhale 1 puff into the lungs daily. 10/18/17  Yes Martyn Ehrich, NP  folic acid (FOLVITE) 1 MG tablet Take 1 tablet (1  mg total) by mouth daily. 01/05/18  Yes Vann, Jessica U, DO  HYDROcodone-homatropine (HYCODAN) 5-1.5 MG/5ML syrup Take 5 mLs by mouth every 6 (six) hours as needed for cough. 01/29/18  Yes [name], MD  ipratropium-albuterol (DUONEB) 0.5-2.5 (3) MG/3ML SOLN INHALE THREE MLS VIA NEBULIZER EVERY 6 HOURS AS NEEDED 02/13/18  Yes Clent Demark, PA-C  promethazine (PHENERGAN) 25 MG tablet Take 1 tablet (25 mg total) by mouth every 6 (six) hours as needed for nausea or vomiting. 12/19/17  Yes Alla Feeling, NP  vitamin B-12 1000 MCG tablet Take 1 tablet (1,000  mcg total) by mouth daily. 01/05/18  Yes Vann, Jessica U, DO  carbamide peroxide (DEBROX) 6.5 % OTIC solution Place 5 drops into both ears 2 (two) times daily. Patient not taking: Reported on 02/27/2018 01/09/18   Clent Demark, PA-C  chlorpheniramine-HYDROcodone [facility]) 10-8 MG/5ML SUER Take 5 mLs by mouth every 12 (twelve) hours as needed for cough. Patient not taking: Reported on 02/27/2018 01/09/18   Clent Demark, PA-C  diphenhydrAMINE (BENADRYL) 25 mg capsule Take 1 capsule (25 mg total) by mouth every 6 (six) hours as needed for itching. Patient not taking: Reported on 02/27/2018 01/04/18   Radene Gunning, NP  exemestane (AROMASIN) 25 MG tablet Take 1 tablet (25 mg total) by mouth daily after breakfast. 02/13/18   [name], MD  furosemide (LASIX) 20 MG tablet Take 1 tablet (20 mg total) by mouth daily as needed (for fluid retention.). Patient not taking: Reported on 02/27/2018 09/25/17   Clent Demark, PA-C  montelukast (SINGULAIR) 10 MG tablet Take 1 tablet (10 mg total) by mouth at bedtime. Patient not taking: Reported on 02/27/2018 10/10/17   Clent Demark, PA-C  pantoprazole (PROTONIX) 40 MG tablet Take 1 tablet (40 mg total) by mouth daily. Take 30-60 min before first meal of the day Patient not taking: Reported on 02/27/2018 01/01/17   [name], MD     Nickolas Madrid, NP 02/27/2018  4:34  PM Pager: [phone] or [phone]

## 2018-02-27 NOTE — H&P (Signed)
[facility]                                                                                    Patient Demographics  Michaela Little, is a 57 y.o. female  CSN: [id number]  MRN: [id number]  DOB - [birth year]  Admit Date - 02/27/2018  Outpatient Primary MD for the patient is Clent Demark, PA-C   With History of -  Past Medical History:  Diagnosis Date  . Anxiety   . Arthritis    hands, knees, hips  . Asthma   . Bronchitis   . Cancer ([place])   . Cervical stenosis (uterine cervix)   . Disorder of appendix    Enlarged  . Dyspnea on exertion   . GERD (gastroesophageal reflux disease)   . Headache(784.0)   . History of breast cancer    2010--  LEFT  s/p  mastectomy (in [place]) AND CHEMORADIATION--  NO RECURRENCE  . History of cervical dysplasia   . Hyperlipidemia   . Hypertension   . Malignant neoplasm of overlapping sites of left breast in female, estrogen receptor positive ([place]) 03/05/2013  . OSA (obstructive sleep apnea) moderate osa per study  09/2010   CPAP  , NOT USING ON REGULAR BASIS  . Pelvic pain in female   . Positive H. pylori test    08-05-2013  . Positive TB test    AS TEEN--  [place] W/ MEDS  . Refusal of blood transfusions as patient is Jehovah's Witness   . Seasonal allergies   . Uterine fibroid   . Wears glasses       Past Surgical History:  Procedure Laterality Date  . APPENDECTOMY  y  . CARDIOVASCULAR STRESS TEST  10-29-2012   low risk perfusion study/  no significant reversibity/ ef 66%/  normal wall motion  . CERVICAL CONIZATION W/BX  2012   in  [place] N/A 05/14/2012   Procedure: LAPAROSCOPIC CHOLECYSTECTOMY;  Surgeon: Ralene Ok, MD;  Location: [place];  Service: General;  Laterality: N/A;  . COLONOSCOPY  2011   normal per patient - [place]  . DILATION AND CURETTAGE OF UTERUS  10/15/2011   Procedure: DILATATION AND CURETTAGE;  Surgeon: Melina Schools, MD;  Location: [facility];  Service: Gynecology;  Laterality: N/A;   Conization &  endocervical curettings  . EXAMINATION UNDER ANESTHESIA N/A 08/20/2013   Procedure: EXAM UNDER ANESTHESIA;  Surgeon: Margarette Asal, MD;  Location: [facility];  Service: Gynecology;  Laterality: N/A;  . [place]   right  . LAPAROSCOPIC APPENDECTOMY N/A 08/22/2017   Procedure: Narc[place];  Surgeon: Coralie Keens, MD;  Location: [place];  Service: General;  Laterality: N/A;  . LAPAROSCOPIC ASSISTED VAGINAL HYSTERECTOMY N/A 10/26/2014   Procedure: HYSTERECTOMY ABDOMINAL ;  Surgeon: Molli Posey, MD;  Location: [facility];  Service: Gynecology;  Laterality: N/A;  . MASTECTOMY Left 11/2008  in [place]   . SALPINGOOPHORECTOMY Bilateral 10/26/2014   Procedure: BILATERAL SALPINGO OOPHORECTOMY;  Surgeon: Molli Posey, MD;  Location: [facility];  Service: Gynecology;  Laterality: Bilateral;  . TISSUE EXPANDER PLACEMENT  Left 08/22/2017   Procedure: REMOVAL OF LEFT TISSUE EXPANDER;  Surgeon: Crissie Reese, MD;  Location: [facility];  Service: Plastics;  Laterality: Left;  . TISSUE EXPANDER REMOVAL Left 08/22/2017  . TRANSTHORACIC ECHOCARDIOGRAM  10-29-2012   mild lvh/  ef 60-65%/  grade II diastolic dysfunction/  trivial mr  &  tr    in for   Chief Complaint  Patient presents with  . Chest Pain  . Shortness of Breath     HPI  Michaela Little  is a 57 y.o. female, with past medical history significant for metastatic breast cancer, right leg DVT diagnosed at [facility] 1 month ago, hyperlipidemia and obstructive sleep apnea presenting today for evaluation of few days history of chest pain shortness of breath.  The patient was started on Lovenox for right leg DVT which had to be stopped due to severe anemia.  At that time goals of care were discussed with the patient at [facility] due to her grave prognosis and refusal of blood transfusion due to religious beliefs (Jehovah's Witness).  Patient was home and she presented  today to the ER where she was diagnosed with pulmonary embolism and work-up in the emergency room showed also that her hemoglobin is 5.8 and platelet count is 71,000.  I spent some time discussing the situation with the patient .  It was quite complex and I got Dr. Loanne Drilling from pulmonary critical care to help me explain the situation to her.  On one hand she has a pulmonary embolus that might kill her and the treatment might also be lethal due to her low platelet count and low hemoglobin.  Patient refused blood products transfusion.  Had a gravity of her situation was stressed especially that she has end-stage metastatic cancer.  Patient wanted to be started on heparin and the IVC filter to be placed in view of her recently diagnosed right leg DVT.  We agreed to go ahead with that    Review of Systems    In addition to the HPI above,  No fever/chills No Headache, No changes with Vision or hearing, No problems swallowing food or Liquids,, No Abdominal pain, No Nausea or Vommitting, Bowel movements are regular, No Blood in stool or Urine, No dysuria, No new skin rashes or bruises, No new joints pains-aches,  No new weakness, tingling, numbness in any extremity, No recent weight gain or loss, No polyuria, polydypsia or polyphagia, No significant Mental Stressors.  A full 10 point Review of Systems was done, except as stated above, all other Review of Systems were negative.   Social History Social History   Tobacco Use  . Smoking status: Former Smoker    Packs/day: 0.30    Years: 10.00    Pack years: 3.00    Types: Cigarettes    Last attempt to quit: 03/26/1988    Years since quitting: 29.9  . Smokeless tobacco: Never Used  Substance Use Topics  . Alcohol use: No     Family History Family History  Problem Relation Age of Onset  . Breast cancer Mother   . Colon cancer Mother   . Hypotension Mother   . Asthma Mother   . Diabetes type II Mother   . Arthritis Mother   .  Clotting disorder Mother   . Cancer Mother        breast/colon  . Mental illness Brother   . Heart disease Brother   . Cerebral palsy Daughter   . Emphysema Brother  never smoker  . Colon cancer Maternal Aunt   . Cancer Maternal Aunt        colon  . Colon cancer Maternal Uncle   . Cancer Maternal Uncle        colon  . Esophageal cancer Neg Hx   . Rectal cancer Neg Hx   . Stomach cancer Neg Hx   . Thyroid disease Neg Hx      Prior to Admission medications   Medication Sig Start Date End Date Taking? Authorizing Provider  cetirizine (ZYRTEC) 10 MG tablet Take 10 mg by mouth daily. 12/19/17  Yes [provider]  fluticasone furoate-vilanterol (BREO ELLIPTA) 100-25 MCG/INH AEPB Inhale 1 puff into the lungs daily. 10/18/17  Yes Martyn Ehrich, NP  folic acid (FOLVITE) 1 MG tablet Take 1 tablet (1 mg total) by mouth daily. 01/05/18  Yes Vann, Jessica U, DO  HYDROcodone-homatropine (HYCODAN) 5-1.5 MG/5ML syrup Take 5 mLs by mouth every 6 (six) hours as needed for cough. 01/29/18  Yes [name], MD  ipratropium-albuterol (DUONEB) 0.5-2.5 (3) MG/3ML SOLN INHALE THREE MLS VIA NEBULIZER EVERY 6 HOURS AS NEEDED Patient taking differently: Take 3 mLs by nebulization every 6 (six) hours as needed (for wheezing or shortness of breath).  02/13/18  Yes Clent Demark, PA-C  promethazine (PHENERGAN) 25 MG tablet Take 1 tablet (25 mg total) by mouth every 6 (six) hours as needed for nausea or vomiting. 12/19/17  Yes Alla Feeling, NP  vitamin B-12 1000 MCG tablet Take 1 tablet (1,000 mcg total) by mouth daily. 01/05/18  Yes Vann, Jessica U, DO  carbamide peroxide (DEBROX) 6.5 % OTIC solution Place 5 drops into both ears 2 (two) times daily. Patient not taking: Reported on 02/27/2018 01/09/18   Clent Demark, PA-C  chlorpheniramine-HYDROcodone [facility]) 10-8 MG/5ML SUER Take 5 mLs by mouth every 12 (twelve) hours as needed for cough. Patient not taking:  Reported on 02/27/2018 01/09/18   Clent Demark, PA-C  diphenhydrAMINE (BENADRYL) 25 mg capsule Take 1 capsule (25 mg total) by mouth every 6 (six) hours as needed for itching. Patient not taking: Reported on 02/27/2018 01/04/18   Radene Gunning, NP  exemestane (AROMASIN) 25 MG tablet Take 1 tablet (25 mg total) by mouth daily after breakfast. 02/13/18   [name], MD  furosemide (LASIX) 20 MG tablet Take 1 tablet (20 mg total) by mouth daily as needed (for fluid retention.). Patient not taking: Reported on 02/27/2018 09/25/17   Clent Demark, PA-C  montelukast (SINGULAIR) 10 MG tablet Take 1 tablet (10 mg total) by mouth at bedtime. Patient not taking: Reported on 02/27/2018 10/10/17   Clent Demark, PA-C  pantoprazole (PROTONIX) 40 MG tablet Take 1 tablet (40 mg total) by mouth daily. Take 30-60 min before first meal of the day Patient not taking: Reported on 02/27/2018 01/01/17   [name], MD    Allergies  Allergen Reactions  . Lisinopril-Hydrochlorothiazide Itching  . Adhesive [Tape] Itching and Other (See Comments)    Redness  . Fentanyl Other (See Comments)    GI upset and drowsiness  . Gabapentin Other (See Comments)    Auditory hallucinations  . Hctz [Hydrochlorothiazide] Itching  . Latex Itching  . Lisinopril Itching  . Losartan Potassium Other (See Comments)    Makes her feel "bad"   . Other Other (See Comments)    Patient refuses blood for religious reasons (per her notes)  . Oxycodone     GI  upset, drowsy  . Tums [Calcium Carbonate Antacid] Hives    FRUIT-FLAVORED ONES    Physical Exam  Vitals  Blood pressure 125/66, pulse 94, resp. rate 14, weight 110.2 kg, last menstrual period 02/25/2009, SpO2 100 %.   1. General chronically ill, no acute distress  2. Normal affect and insight, Not Suicidal or Homicidal, Awake Alert, Oriented X 3.  3. No F.N deficits, grossly, patient moving all extremities.  4. Ears and Eyes appear Normal, Conjunctivae  pale, PERRLA. Moist Oral Mucosa.  5. Supple Neck, No JVD, .  6. Symmetrical Chest wall movement, decreased breath sounds at the bases.  7. RRR, No Gallops, Rubs or Murmurs, No Parasternal Heave.  8. Positive Bowel Sounds, Abdomen Soft, Non tender, No organomegaly appriciated,No rebound -guarding or rigidity.  9.  No Cyanosis, Normal Skin Turgor, No Skin Rash or Bruise.  10. Good muscle tone,  joints appear normal , right leg bigger than left.    Data Review  CBC Recent Labs  Lab 02/24/18 0948 02/27/18 1237  WBC 4.1 5.7  HGB 5.5* 5.8*  HCT 19.0* 20.9*  PLT 85* 71*  MCV 97.4 99.1  MCH 28.2 27.5  MCHC 28.9* 27.8*  RDW 19.8* 19.7*  LYMPHSABS 1.4  --   MONOABS 0.3  --   EOSABS 0.1  --   BASOSABS 0.1  --    ------------------------------------------------------------------------------------------------------------------  Chemistries  Recent Labs  Lab 02/24/18 0948 02/27/18 1237  NA 141 138  K 3.5 3.6  CL 106 104  CO2 22 22  GLUCOSE 177* 129*  BUN 9 11  CREATININE 0.77 0.71  CALCIUM 9.6 9.9  AST 23  --   ALT 17  --   ALKPHOS 134*  --   BILITOT 1.7*  --    ------------------------------------------------------------------------------------------------------------------ estimated creatinine clearance is 96.8 mL/min (by C-G formula based on SCr of 0.71 mg/dL). ------------------------------------------------------------------------------------------------------------------ No results for input(s): TSH, T4TOTAL, T3FREE, THYROIDAB in the last 72 hours.  Invalid input(s): FREET3   Coagulation profile No results for input(s): INR, PROTIME in the last 168 hours. ------------------------------------------------------------------------------------------------------------------- No results for input(s): DDIMER in the last 72 hours. -------------------------------------------------------------------------------------------------------------------  Cardiac  Enzymes No results for input(s): CKMB, TROPONINI, MYOGLOBIN in the last 168 hours.  Invalid input(s): CK ------------------------------------------------------------------------------------------------------------------ Invalid input(s): POCBNP   ---------------------------------------------------------------------------------------------------------------  Urinalysis    Component Value Date/Time   COLORURINE YELLOW 08/27/2017 [place] 08/27/2017 2344   LABSPEC >1.046 (H) 08/27/2017 2344   LABSPEC 1.025 01/11/2016 1233   PHURINE 6.0 08/27/2017 2344   GLUCOSEU NEGATIVE 08/27/2017 2344   GLUCOSEU Negative 01/11/2016 1233   HGBUR NEGATIVE 08/27/2017 2344   BILIRUBINUR NEGATIVE 08/27/2017 2344   BILIRUBINUR small 12/03/2016 1117   BILIRUBINUR Negative 01/11/2016 [place] 08/27/2017 2344   PROTEINUR NEGATIVE 08/27/2017 2344   UROBILINOGEN 1.0 12/03/2016 1117   UROBILINOGEN 1.0 09/13/2016 1306   UROBILINOGEN 0.2 01/11/2016 1233   NITRITE NEGATIVE 08/27/2017 2344   LEUKOCYTESUR NEGATIVE 08/27/2017 2344   LEUKOCYTESUR Small 01/11/2016 1233    ----------------------------------------------------------------------------------------------------------------   Imaging results:   Dg Chest 2 View  Result Date: 02/27/2018 CLINICAL DATA:  Right sided CP/flank pain with SOB x 3 days, HTN, past smoker - quit '98, hx of right leg clots EXAM: CHEST - 2 VIEW COMPARISON:  01/01/2018 FINDINGS: Heart size is normal. The lungs are free of focal consolidations and pleural effusions. No pulmonary edema. Surgical clips are present in the LEFT axillary region. Degenerative changes are seen in thoracic spine. IMPRESSION:  No evidence for acute cardiopulmonary abnormality. Electronically Signed   By: Nolon Nations M.D.   On: 02/27/2018 13:43   Ct Angio Chest Pe W And/or Wo Contrast  Result Date: 02/27/2018 CLINICAL DATA:  Shortness of breath for the past 2 days.  History of breast cancer with possible bone metastases. EXAM: CT ANGIOGRAPHY CHEST WITH CONTRAST TECHNIQUE: Multidetector CT imaging of the chest was performed using the standard protocol during bolus administration of intravenous contrast. Multiplanar CT image reconstructions and MIPs were obtained to evaluate the vascular anatomy. CONTRAST:  20mL ISOVUE-370 IOPAMIDOL (ISOVUE-370) INJECTION 76% COMPARISON:  Chest radiographs obtained earlier today. PET-CT dated 01/15/2018. Chest CTA and abdomen and pelvis CT dated 08/27/2017. FINDINGS: Cardiovascular: Small pericardial effusion with a maximum thickness of 7 mm. Moderate-sized right pulmonary arterial filling defects. These are in the right middle lobe and right lower lobe branches. The right ventricular to left ventricular ratio is normal at 0.84. Mediastinum/Nodes: Mildly enlarged thyroid gland with an incidental 7 mm right lobe nodule. No enlarged lymph nodes. Lungs/Pleura: The interstitial markings in both lower lobes remain mildly prominent. No lung nodules or pleural fluid. Upper Abdomen: Cholecystectomy clips. Musculoskeletal: Or heterogeneous appearance of the bones with patchy sclerosis and lucency throughout the bony skeleton. The areas of lucency are larger and more confluent than previously demonstrated in multiple thoracic vertebrae and in the upper sternum with some cortical breakthrough posteriorly in the upper sternum with a small amount of associated retrosternal soft tissue density. The new, confluent areas of lucency are most pronounced in the T7 vertebral body with an interval minimal compression deformity of the vertebral body with stable mild compression deformity of the adjacent T8 vertebral body. Postmastectomy changes are again noted on the left. Review of the MIP images confirms the above findings. IMPRESSION: 1. Moderate sized pulmonary emboli on the right without evidence of right heart strain. 2. Progressive bony metastatic disease as  described above. 3. Minimal pericardial effusion. Critical Value/emergent results were called by telephone at the time of interpretation on 02/27/2018 at 2:50 pm to Dr. Veryl Speak , who verbally acknowledged these results. Electronically Signed   By: Claudie Revering M.D.   On: 02/27/2018 14:54    My personal review of EKG: Sinus rhythm at 95 bpm with nonspecific T wave changes  Assessment & Plan  Pulmonary embolism with history of right leg DVT which is clinically still present IVC filter ordered Patient started on low-dose heparin Monitor her hemoglobin and hematocrit closely Check CBC in a.m. due to thrombocytopenia  Metastatic breast cancer, end-stage  Symptomatic anemia and thrombocytopenia Severe patient is a Jehovah's witness and refusing any blood products  Essential hypertension not on any medications at this time  GERD continue with Protonix  COPD continue with Brio and nebulizer treatment  Plan ; discussed with the patient and family at bedside at length in the presence of PCCM.  Pros and cons were discussed.  Patient given the options available and she elected to be started on heparin and have the IVC filter placed and refused blood transfusion at this time     DVT Prophylaxis Heparin treatment dose  AM Labs Ordered, also please review Full Orders  Family Communication: Admission, patients condition and plan of care including tests being ordered have been discussed with the patient and daughter who indicate understanding and agree with the plan and Code Status.  Code Status full  Disposition Plan: To be determined  Time spent in minutes : 56 minutes  Condition critical   @  [email or web address]@

## 2018-02-27 NOTE — ED Notes (Signed)
Patient transported to X-ray 

## 2018-02-27 NOTE — ED Provider Notes (Addendum)
[facility] EMERGENCY DEPARTMENT Provider Note   CSN: [id number] Arrival date & time: 02/27/18  1218     History   Chief Complaint Chief Complaint  Patient presents with  . Chest Pain  . Shortness of Breath    HPI Michaela Little is a 57 y.o. female.  This patient is a 57 year old female with extensive past medical history including metastatic breast cancer, DVT of the right leg, hyperlipidemia, and obstructive sleep apnea.  She presents today for evaluation of chest pain and shortness of breath.  This is worsened over the past several days.  She has a known DVT in her right thigh and was previously on blood thinners.  She was recently found to have a hemoglobin of 4 and this Lovenox was stopped.  She presents today with increased swelling of her right thigh and is now experiencing pain in her chest and shortness of breath.  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  The history is provided by the patient.  Chest Pain   This is a new problem. The current episode started 2 days ago. The problem occurs constantly. The problem has been gradually worsening. The pain is associated with walking. The pain is present in the substernal region. The pain is moderate. The quality of the pain is described as pressure-like. The pain does not radiate. Associated symptoms include shortness of breath. Pertinent negatives include no diaphoresis, no fever and no nausea. She has tried nothing for the symptoms. The treatment provided no relief.  Her past medical history is significant for cancer.  Shortness of Breath  Associated symptoms include chest pain. Pertinent negatives include no fever.    Past Medical History:  Diagnosis Date  . Anxiety   . Arthritis    hands, knees, hips  . Asthma   . Bronchitis   . Cancer ([place])   . Cervical stenosis (uterine cervix)   . Disorder of appendix    Enlarged  . Dyspnea on exertion   . GERD  (gastroesophageal reflux disease)   . Headache(784.0)   . History of breast cancer    2010--  LEFT  s/p  mastectomy (in [place]) AND CHEMORADIATION--  NO RECURRENCE  . History of cervical dysplasia   . Hyperlipidemia   . Hypertension   . Malignant neoplasm of overlapping sites of left breast in female, estrogen receptor positive ([place]) 03/05/2013  . OSA (obstructive sleep apnea) moderate osa per study  09/2010   CPAP  , NOT USING ON REGULAR BASIS  . Pelvic pain in female   . Positive H. pylori test    08-05-2013  . Positive TB test    AS TEEN--  [place] W/ MEDS  . Refusal of blood transfusions as patient is Jehovah's Witness   . Seasonal allergies   . Uterine fibroid   . Wears glasses     Patient Active Problem List   Diagnosis Date Noted  . Acute respiratory failure with hypoxia ([place]) 01/04/2018  . Symptomatic anemia 01/02/2018  . Pancytopenia ([place]) 01/02/2018  . Bronchitis 01/02/2018  . Patient is Jehovah's Witness   . Refusal of blood transfusions as patient is Jehovah's Witness   . Other pancytopenia ([place]) 12/24/2017  . Asthma exacerbation 10/18/2017  . High risk medication use 09/25/2017  . Lower extremity edema 09/25/2017  . S/P laparoscopic appendectomy 08/22/2017  . Goals of care, counseling/discussion 01/20/2017  . Multinodular thyroid 02/08/2016  . Lumbar back pain with radiculopathy  affecting left lower extremity 02/08/2016  . Carcinoma of breast metastatic to bone ([place]) 02/08/2016  . Bone metastases ([place]) 11/13/2015  . Right thyroid nodule 06/30/2014  . Allergic rhinitis due to pollen 06/30/2014  . Essential hypertension 12/24/2013  . Left ventricular diastolic dysfunction with preserved systolic function 19/50/9326  . Lump in neck 08/11/2013  . Abdominal pain, chronic, bilateral lower quadrant 08/05/2013  . Abdominal pain, epigastric 08/05/2013  . Morbid (severe) obesity due to excess calories ([place]) 06/23/2013  . Allergic rhinitis 06/23/2013  . GERD (gastroesophageal  reflux disease) 03/25/2013  . Constipation 03/25/2013  . Other and unspecified hyperlipidemia 03/25/2013  . Dysuria 03/25/2013  . Routine general medical examination at a health care facility 03/25/2013  . Malignant neoplasm of overlapping sites of left breast in female, estrogen receptor positive ([place]) 03/05/2013  . Cough 10/28/2012  . Dyspnea on exertion 10/28/2012  . Chronic abdominal pain 09/02/2012  . Pelvic pain in female 02/06/2012  . Atypical chest pain 11/13/2011  . Hyperlipidemia 11/13/2011  . Dysplasia of cervix, low grade (CIN 1) 08/22/2011  . Diastolic dysfunction 71/24/5809  . Influenza 03/04/2011  . Hypertension 03/04/2011  . Obesity 03/04/2011  . OSA (obstructive sleep apnea) 02/27/2011  . Asthma 02/08/2011    Past Surgical History:  Procedure Laterality Date  . APPENDECTOMY  y  . CARDIOVASCULAR STRESS TEST  10-29-2012   low risk perfusion study/  no significant reversibity/ ef 66%/  normal wall motion  . CERVICAL CONIZATION W/BX  2012   in  [place] N/A 05/14/2012   Procedure: LAPAROSCOPIC CHOLECYSTECTOMY;  Surgeon: Ralene Ok, MD;  Location: [facility];  Service: General;  Laterality: N/A;  . COLONOSCOPY  2011   normal per patient - [place]  . DILATION AND CURETTAGE OF UTERUS  10/15/2011   Procedure: DILATATION AND CURETTAGE;  Surgeon: Melina Schools, MD;  Location: [facility];  Service: Gynecology;  Laterality: N/A;  Conization &  endocervical curettings  . EXAMINATION UNDER ANESTHESIA N/A 08/20/2013   Procedure: EXAM UNDER ANESTHESIA;  Surgeon: Margarette Asal, MD;  Location: [facility];  Service: Gynecology;  Laterality: N/A;  . [place]   right  . LAPAROSCOPIC APPENDECTOMY N/A 08/22/2017   Procedure: [place];  Surgeon: Coralie Keens, MD;  Location: [facility];  Service: General;  Laterality: N/A;  . LAPAROSCOPIC ASSISTED VAGINAL HYSTERECTOMY N/A 10/26/2014   Procedure: HYSTERECTOMY ABDOMINAL ;   Surgeon: Molli Posey, MD;  Location: [facility];  Service: Gynecology;  Laterality: N/A;  . MASTECTOMY Left 11/2008  in [place]   . SALPINGOOPHORECTOMY Bilateral 10/26/2014   Procedure: BILATERAL SALPINGO OOPHORECTOMY;  Surgeon: Molli Posey, MD;  Location: [facility];  Service: Gynecology;  Laterality: Bilateral;  . TISSUE EXPANDER PLACEMENT Left 08/22/2017   Procedure: REMOVAL OF LEFT TISSUE EXPANDER;  Surgeon: Crissie Reese, MD;  Location: [facility];  Service: Plastics;  Laterality: Left;  . TISSUE EXPANDER REMOVAL Left 08/22/2017  . TRANSTHORACIC ECHOCARDIOGRAM  10-29-2012   mild lvh/  ef 60-65%/  grade II diastolic dysfunction/  trivial mr  &  tr     OB History    Gravida  10   Para  5   Term  5   Preterm      AB  5   Living  5     SAB  3   TAB  2   Ectopic      Multiple  Live Births               Home Medications    Prior to Admission medications   Medication Sig Start Date End Date Taking? Authorizing Provider  carbamide peroxide (DEBROX) 6.5 % OTIC solution Place 5 drops into both ears 2 (two) times daily. 01/09/18   Clent Demark, PA-C  cetirizine (ZYRTEC) 10 MG tablet Take 10 mg by mouth daily. 12/19/17   [provider]  chlorpheniramine-HYDROcodone (TUSSIONEX PENNKINETIC ER) 10-8 MG/5ML SUER Take 5 mLs by mouth every 12 (twelve) hours as needed for cough. 01/09/18   Clent Demark, PA-C  diphenhydrAMINE (BENADRYL) 25 mg capsule Take 1 capsule (25 mg total) by mouth every 6 (six) hours as needed for itching. 01/04/18   Black, Lezlie Octave, NP  exemestane (AROMASIN) 25 MG tablet Take 1 tablet (25 mg total) by mouth daily after breakfast. Patient not taking: Reported on 02/24/2018 02/13/18   [name], MD  fluticasone furoate-vilanterol (BREO ELLIPTA) 100-25 MCG/INH AEPB Inhale 1 puff into the lungs daily. 10/18/17   Martyn Ehrich, NP  folic acid (FOLVITE) 1 MG tablet Take 1 tablet (1 mg total) by mouth daily. 01/05/18   Geradine Girt, DO  furosemide (LASIX) 20 MG tablet Take 1 tablet (20 mg total) by mouth daily as needed (for fluid retention.). 09/25/17   Clent Demark, PA-C  HYDROcodone-homatropine [facility]) 5-1.5 MG/5ML syrup Take 5 mLs by mouth every 6 (six) hours as needed for cough. 01/29/18   [name], MD  ipratropium-albuterol (DUONEB) 0.5-2.5 (3) MG/3ML SOLN INHALE THREE MLS VIA NEBULIZER EVERY 6 HOURS AS NEEDED 02/13/18   Clent Demark, PA-C  montelukast (SINGULAIR) 10 MG tablet Take 1 tablet (10 mg total) by mouth at bedtime. 10/10/17   Clent Demark, PA-C  pantoprazole (PROTONIX) 40 MG tablet Take 1 tablet (40 mg total) by mouth daily. Take 30-60 min before first meal of the day 01/01/17   [name], MD  promethazine (PHENERGAN) 25 MG tablet Take 1 tablet (25 mg total) by mouth every 6 (six) hours as needed for nausea or vomiting. 12/19/17   Alla Feeling, NP  vitamin B-12 1000 MCG tablet Take 1 tablet (1,000 mcg total) by mouth daily. 01/05/18   Geradine Girt, DO    Family History Family History  Problem Relation Age of Onset  . Breast cancer Mother   . Colon cancer Mother   . Hypotension Mother   . Asthma Mother   . Diabetes type II Mother   . Arthritis Mother   . Clotting disorder Mother   . Cancer Mother        breast/colon  . Mental illness Brother   . Heart disease Brother   . Cerebral palsy Daughter   . Emphysema Brother        never smoker  . Colon cancer Maternal Aunt   . Cancer Maternal Aunt        colon  . Colon cancer Maternal Uncle   . Cancer Maternal Uncle        colon  . Esophageal cancer Neg Hx   . Rectal cancer Neg Hx   . Stomach cancer Neg Hx   . Thyroid disease Neg Hx     Social History Social History   Tobacco Use  . Smoking status: Former Smoker    Packs/day: 0.30    Years: 10.00    Pack years: 3.00    Types: Cigarettes    Last  attempt to quit: 03/26/1988    Years since quitting: 29.9  . Smokeless tobacco: Never Used  Substance Use  Topics  . Alcohol use: No  . Drug use: No     Allergies   Lisinopril-hydrochlorothiazide; Adhesive [tape]; Fentanyl; Gabapentin; Hctz [hydrochlorothiazide]; Latex; Lisinopril; Losartan potassium; Other; Oxycodone; and Tums [calcium carbonate antacid]   Review of Systems Review of Systems  Constitutional: Negative for diaphoresis and fever.  Respiratory: Positive for shortness of breath.   Cardiovascular: Positive for chest pain.  Gastrointestinal: Negative for nausea.     Physical Exam Updated Vital Signs BP (!) 110/55   Pulse (!) 102   Resp 17   Wt 110.2 kg   LMP 02/25/2009   SpO2 100%   BMI 38.06 kg/m   Physical Exam   ED Treatments / Results  Labs (all labs ordered are listed, but only abnormal results are displayed) Labs Reviewed  I-STAT BETA HCG BLOOD, ED ([facility], [facility], [facility] ONLY) - Abnormal; Notable for the following components:      Result Value   I-stat hCG, quantitative 6.3 (*)    All other components within normal limits  BASIC METABOLIC PANEL  CBC  BRAIN NATRIURETIC PEPTIDE  I-STAT TROPONIN, ED  I-STAT TROPONIN, ED    EKG EKG Interpretation  Date/Time:  Thursday February 27 2018 12:28:30 EST Ventricular Rate:  95 PR Interval:    QRS Duration: 95 QT Interval:  279 QTC Calculation: 351 R Axis:   21 Text Interpretation:  Sinus rhythm Abnormal R-wave progression, early transition Nonspecific T abnormalities, lateral leads Confirmed by Veryl Speak [phone]) on 02/27/2018 1:04:29 PM   Radiology No results found.  Procedures Procedures (including critical care time)  Medications Ordered in ED Medications - No data to display   Initial Impression / Assessment and Plan / ED Course  I have reviewed the triage vital signs and the nursing notes.  Pertinent labs & imaging results that were available during my care of the patient were reviewed by me and considered in my medical decision making (see chart for details).  Patient with history of metastatic  breast cancer with known DVT of the right thigh.  She has been off of her blood thinners due to GI bleeding and hemoglobin of 4.  She presents today with chest pain and shortness of breath that appears to be related to pulmonary emboli.  Patient will need to be anticoagulated and admitted to the hospital.  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  She will need close monitoring for GI blood loss while she is on the blood thinner.  I have spoken with Dr. Laren Everts who agrees to admit.  CRITICAL CARE Performed by: Veryl Speak Total critical care time: 45 minutes Critical care time was exclusive of separately billable procedures and treating other patients. Critical care was necessary to treat or prevent imminent or life-threatening deterioration. Critical care was time spent personally by me on the following activities: development of treatment plan with patient and/or surrogate as well as nursing, discussions with consultants, evaluation of patient's response to treatment, examination of patient, obtaining history from patient or surrogate, ordering and performing treatments and interventions, ordering and review of laboratory studies, ordering and review of radiographic studies, pulse oximetry and re-evaluation of patient's condition.   Final Clinical Impressions(s) / ED Diagnoses   Final diagnoses:  None    ED Discharge Orders    None       Veryl Speak, MD 02/28/18 [phone]  Veryl Speak, MD 02/28/18 [phone]

## 2018-02-27 NOTE — Telephone Encounter (Signed)
Visit scheduled with Palliative NP today

## 2018-02-27 NOTE — ED Triage Notes (Signed)
Pt in from home via [facility] with sob and cp. Has confirmed R thigh DVT x 12 mo ago. States she has hx of Breast CA w/mets to bone and has had to stop her Lovenox d/t low Hg of 5.5 and Plts 85. C/o worsening R thigh swelling x 4 days and cp today. Wears 2LNC, took 324 ASA PTA

## 2018-02-27 NOTE — Progress Notes (Signed)
ANTICOAGULATION CONSULT NOTE - Initial Consult  Pharmacy Consult for Heparin Indication: pulmonary embolus  Allergies  Allergen Reactions  . Lisinopril-Hydrochlorothiazide Itching  . Adhesive [Tape] Itching and Other (See Comments)    Redness  . Fentanyl Other (See Comments)    GI upset and drowsiness  . Gabapentin Other (See Comments)    Auditory hallucinations  . Hctz [Hydrochlorothiazide] Itching  . Latex Itching  . Lisinopril Itching  . Losartan Potassium Other (See Comments)    Makes her feel "bad"   . Other Other (See Comments)    Patient refuses blood for religious reasons (per her notes)  . Oxycodone     GI upset, drowsy  . Tums [Calcium Carbonate Antacid] Hives    FRUIT-FLAVORED ONES    Patient Measurements: IBW 61.6 kg Weight: 243 lb (110.2 kg) Heparin Dosing Weight:  87kg   Vital Signs: BP: 125/66 (12/05 1730) Pulse Rate: 94 (12/05 1730)  Labs: Recent Labs    02/27/18 1237  HGB 5.8*  HCT 20.9*  PLT 71*  CREATININE 0.71    Estimated Creatinine Clearance: 96.8 mL/min (by C-G formula based on SCr of 0.71 mg/dL).   Medical History: Past Medical History:  Diagnosis Date  . Anxiety   . Arthritis    hands, knees, hips  . Asthma   . Bronchitis   . Cancer ([place])   . Cervical stenosis (uterine cervix)   . Disorder of appendix    Enlarged  . Dyspnea on exertion   . GERD (gastroesophageal reflux disease)   . Headache(784.0)   . History of breast cancer    2010--  LEFT  s/p  mastectomy (in [place]) AND CHEMORADIATION--  NO RECURRENCE  . History of cervical dysplasia   . Hyperlipidemia   . Hypertension   . Malignant neoplasm of overlapping sites of left breast in female, estrogen receptor positive ([place]) 03/05/2013  . OSA (obstructive sleep apnea) moderate osa per study  09/2010   CPAP  , NOT USING ON REGULAR BASIS  . Pelvic pain in female   . Positive H. pylori test    08-05-2013  . Positive TB test    AS TEEN--  [place] W/ MEDS  . Refusal of blood  transfusions as patient is Jehovah's Witness   . Seasonal allergies   . Uterine fibroid   . Wears glasses     Assessment: CC/HPI: dyspnea, cough and pleuritic pain, R thigh swelling. ER w/u revealed mod sized R PE   PMH: Jehovah's witness with hx HTN, OSA, obesity, stage IIIa metastatic breast cancer, previous DVT with course c/b bleeding (anticoag was stopped)., anxiety, arthritis, asthma, bronchitis, GERD, cervical dysplasia, HLD, Positive H. pylori test, Positive TB test  Anticoag: Hgb 5.8. Plts 71 (her average). H/o DVT previously on lovenox, stopped r/t worsening anemia.  Pt was apparently told to restart but stopped on her own due to risk of bleeding.    Goal of Therapy:  Heparin level 0.3-0.5 units/ml Monitor platelets by anticoagulation protocol: Yes   Plan:  Heparin 4000 unit IV bolus Heparin infusion at 1400 units/hr Heparin level and CBC daily F/u to resume Lovenox   Mahlet S. Alford Highland, PharmD, BCPS Clinical Staff Pharmacist Eilene Ghazi Stillinger 02/27/2018,6:23 PM

## 2018-02-27 NOTE — ED Notes (Signed)
Admitting at bedside 

## 2018-02-28 ENCOUNTER — Other Ambulatory Visit (HOSPITAL_COMMUNITY): Payer: Medicare Other

## 2018-02-28 DIAGNOSIS — I2699 Other pulmonary embolism without acute cor pulmonale: Principal | ICD-10-CM

## 2018-02-28 DIAGNOSIS — C50919 Malignant neoplasm of unspecified site of unspecified female breast: Secondary | ICD-10-CM

## 2018-02-28 DIAGNOSIS — I10 Essential (primary) hypertension: Secondary | ICD-10-CM

## 2018-02-28 DIAGNOSIS — C7951 Secondary malignant neoplasm of bone: Secondary | ICD-10-CM

## 2018-02-28 DIAGNOSIS — C786 Secondary malignant neoplasm of retroperitoneum and peritoneum: Secondary | ICD-10-CM

## 2018-02-28 DIAGNOSIS — I82409 Acute embolism and thrombosis of unspecified deep veins of unspecified lower extremity: Secondary | ICD-10-CM

## 2018-02-28 LAB — HEPARIN LEVEL (UNFRACTIONATED)
HEPARIN UNFRACTIONATED: 0.54 [IU]/mL (ref 0.30–0.70)
Heparin Unfractionated: 0.54 IU/mL (ref 0.30–0.70)
Heparin Unfractionated: >2.2 IU/mL — ABNORMAL HIGH (ref 0.30–0.70)
Heparin Unfractionated: 0.39 IU/mL (ref 0.30–0.70)

## 2018-02-28 LAB — CBC
HCT: 18.6 % — ABNORMAL LOW (ref 36.0–46.0)
Hemoglobin: 5.3 g/dL — CL (ref 12.0–15.0)
MCH: 27.5 pg (ref 26.0–34.0)
MCHC: 28.5 g/dL — ABNORMAL LOW (ref 30.0–36.0)
MCV: 96.4 fL (ref 80.0–100.0)
Platelets: 70 10*3/uL — ABNORMAL LOW (ref 150–400)
RBC: 1.93 MIL/uL — ABNORMAL LOW (ref 3.87–5.11)
RDW: 19.6 % — ABNORMAL HIGH (ref 11.5–15.5)
WBC: 5.4 10*3/uL (ref 4.0–10.5)
nRBC: 0 % (ref 0.0–0.2)

## 2018-02-28 LAB — PROTIME-INR
INR: 1.31
Prothrombin Time: 16.1 seconds — ABNORMAL HIGH (ref 11.4–15.2)

## 2018-02-28 LAB — MRSA PCR SCREENING: MRSA by PCR: NEGATIVE

## 2018-02-28 LAB — HEMOGLOBIN
Hemoglobin: 5.5 g/dL — CL (ref 12.0–15.0)
Hemoglobin: 5.3 g/dL — CL (ref 12.0–15.0)

## 2018-02-28 MED ORDER — IPRATROPIUM-ALBUTEROL 0.5-2.5 (3) MG/3ML IN SOLN
3.0000 mL | Freq: Three times a day (TID) | RESPIRATORY_TRACT | Status: DC
  Administered 2018-02-28: 3 mL via RESPIRATORY_TRACT
  Filled 2018-02-28: qty 3

## 2018-02-28 MED ORDER — ACETAMINOPHEN 325 MG PO TABS
650.0000 mg | ORAL_TABLET | Freq: Four times a day (QID) | ORAL | Status: DC | PRN
  Administered 2018-02-28 – 2018-03-01 (×2): 650 mg via ORAL
  Filled 2018-02-28 (×2): qty 2

## 2018-02-28 MED ORDER — INFLUENZA VAC SPLIT QUAD 0.5 ML IM SUSY
0.5000 mL | PREFILLED_SYRINGE | INTRAMUSCULAR | Status: DC
  Filled 2018-02-28 (×2): qty 0.5

## 2018-02-28 NOTE — Care Management Note (Addendum)
Case Management Note  Patient Details  Name: Michaela Little MRN: [id number] Date of Birth: 04-Feb-1961  Subjective/Objective: Pt presented for chest pain and SOB-new pulmonary embolism. PTA from home alone [facility]- has support of daughter at the bedside. Patient has [facility] Medicare/Medicaid.                  Action/Plan: Benefits check sent for Xarelto/Eliquis/Lovenox. Patient discussed needs in the home and she will benefit from [facility] for Disease Management. CM discussed [facility]- and daughter to call and schedule an appointment with them to see if she is eligible. CM did offer choice via CMS Medicare .Gov agency list and patient chose [facility] unable to accept at this time. CM did call [facility] to see if in network. Pt will need order for [facility] RN and F2F once stable.   Expected Discharge Date:                  Expected Discharge Plan:  [place]  In-House Referral:  NA  Discharge planning Services  CM Consult  Post Acute Care Choice:  Home Health Choice offered to:  Patient  DME Arranged:   N/A DME Agency:  NA  HH Arranged:  RN, Disease Management HH Agency:   [facility]  Status of Service:  In process, will continue to follow  If discussed at Long Length of Stay Meetings, dates discussed:    Additional Comments: 1434 03-07-18 Jacqlyn Krauss, RN,BSN [phone] CM spoke with MD in regards to calling the prior authorization for Lovenox. Patient has additional questions in regards to a procedure. CM to ask Staff RN to discuss with patient. CM will continue to monitor.    1230 03-07-18 Jacqlyn Krauss, RN,BSN [phone] CM will continue to monitor for transition of care needs. [facility] to follow post hospitalization.   1214 02-28-18 Jacqlyn Krauss, RN BSN Case Manager [phone] [facility] was not in network- CM called Malachy Mood with [facility] and [facility] to begin on Tuesday. Patient is being followed by Palliative Care in the  home- pt usnsure of agency name. No further needs from CM at this time.  Bethena Roys, RN 02/28/2018, 12:03 PM

## 2018-02-28 NOTE — Progress Notes (Signed)
[place] for Heparin Indication: pulmonary embolus  Allergies  Allergen Reactions  . Lisinopril-Hydrochlorothiazide Itching  . Adhesive [Tape] Itching and Other (See Comments)    Redness  . Fentanyl Other (See Comments)    GI upset and drowsiness  . Gabapentin Other (See Comments)    Auditory hallucinations  . Hctz [Hydrochlorothiazide] Itching  . Latex Itching  . Lisinopril Itching  . Losartan Potassium Other (See Comments)    Makes her feel "bad"   . Other Other (See Comments)    Patient refuses blood for religious reasons (per her notes)  . Oxycodone     GI upset, drowsy  . Tums [Calcium Carbonate Antacid] Hives    FRUIT-FLAVORED ONES    Patient Measurements: IBW 61.6 kg Weight: 241 lb 13.5 oz (109.7 kg) Heparin Dosing Weight:  87kg   Vital Signs: Temp: 99.5 F (37.5 C) (12/06 2118) Temp Source: Oral (12/06 2118) BP: 150/79 (12/06 2118) Pulse Rate: 110 (12/06 2118)  Labs: Recent Labs    02/27/18 1237  02/28/18 0611 02/28/18 1128 02/28/18 1430 02/28/18 1926 02/28/18 2134  HGB 5.8*   < > 5.3*  --  5.5*  --  5.3*  HCT 20.9*  --  18.6*  --   --   --   --   PLT 71*  --  70*  --   --   --   --   LABPROT  --   --   --   --  16.1*  --   --   INR  --   --   --   --  1.31  --   --   HEPARINUNFRC  --    < >  --  0.54  --  >2.20* 0.39  CREATININE 0.71  --   --   --   --   --   --    < > = values in this interval not displayed.    Estimated Creatinine Clearance: 96.6 mL/min (by C-G formula based on SCr of 0.71 mg/dL).   Medical History: Past Medical History:  Diagnosis Date  . Anxiety   . Arthritis    hands, knees, hips  . Asthma   . Bronchitis   . Cancer ([place])   . Cervical stenosis (uterine cervix)   . Disorder of appendix    Enlarged  . Dyspnea on exertion   . GERD (gastroesophageal reflux disease)   . Headache(784.0)   . History of breast cancer    2010--  LEFT  s/p  mastectomy (in [place]) AND CHEMORADIATION--   NO RECURRENCE  . History of cervical dysplasia   . Hyperlipidemia   . Hypertension   . Malignant neoplasm of overlapping sites of left breast in female, estrogen receptor positive ([place]) 03/05/2013  . OSA (obstructive sleep apnea) moderate osa per study  09/2010   CPAP  , NOT USING ON REGULAR BASIS  . Pelvic pain in female   . Positive H. pylori test    08-05-2013  . Positive TB test    AS TEEN--  [place] W/ MEDS  . Refusal of blood transfusions as patient is Jehovah's Witness   . Seasonal allergies   . Uterine fibroid   . Wears glasses     Assessment: CC/HPI: dyspnea, cough and pleuritic pain, R thigh swelling. ER w/u revealed mod sized R PE   PMH: Jehovah's witness with hx HTN, OSA, obesity, stage IIIa metastatic breast cancer, previous DVT with course c/b bleeding (  anticoag was stopped)., anxiety, arthritis, asthma, bronchitis, GERD, cervical dysplasia, HLD, Positive H. pylori test, Positive TB test  Anticoag: Hgb 5.8. Plts 71 (her average). H/o DVT previously on lovenox, stopped r/t worsening anemia.  Pt was apparently told to restart but stopped on her own due to risk of bleeding.   12/6 pm update: heparin level now within target range at 0.39, no changes to infusion for now.   Goal of Therapy:  Heparin level 0.3-0.5 units/ml Monitor platelets by anticoagulation protocol: Yes   Plan:  Cont heparin at 1300 units/hr Daily heparin level and CBC  Arrie Senate, PharmD, BCPS Clinical Pharmacist [phone] Please check AMION for all [facility] Pharmacy numbers 02/28/2018

## 2018-02-28 NOTE — Progress Notes (Signed)
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  She was admitted for worsening cough, dyspnea and leg swelling.  She was found to have multiple PE, she is on heparin drip, no signs of bleeding now.   Objective:  Vitals:   02/28/18 0759 02/28/18 0840  BP: (!) 153/78   Pulse: 97   Resp:    Temp: 98.6 F (37 C)   SpO2: 98% 100%    Body mass index is 37.88 kg/m.  Intake/Output Summary (Last 24 hours) at 02/28/2018 0946 Last data filed at 02/28/2018 0939 Gross per 24 hour  Intake 214 ml  Output 400 ml  Net -186 ml     Sclerae unicteric  Oropharynx clear  No peripheral adenopathy  Lungs clear -- no rales or rhonchi  Heart regular rate and rhythm  Abdomen benign  MSK no focal spinal tenderness, no peripheral edema  Neuro nonfocal   CBG (last 3)  No results for input(s): GLUCAP in the last 72 hours.   Labs:  Lab Results  Component Value Date   WBC 5.4 02/28/2018   HGB 5.3 (LL) 02/28/2018   HCT 18.6 (L) 02/28/2018   MCV 96.4 02/28/2018   PLT 70 (L) 02/28/2018   NEUTROABS 2.0 02/24/2018   CMP Latest Ref Rng & Units 02/27/2018 02/24/2018 02/11/2018  Glucose 70 - 99 mg/dL 129(H) 177(H) 146(H)  BUN 6 - 20 mg/dL 11 9 8   Creatinine 0.44 - 1.00 mg/dL 0.71 0.77 0.71  Sodium 135 - 145 mmol/L 138 141 142  Potassium 3.5 - 5.1 mmol/L 3.6 3.5 3.7  Chloride 98 - 111 mmol/L 104 106 108  CO2 22 - 32 mmol/L 22 22 24   Calcium 8.9 - 10.3 mg/dL 9.9 9.6 9.5  Total Protein 6.5 - 8.1 g/dL - 7.1 7.2  Total Bilirubin 0.3 - 1.2 mg/dL - 1.7(H) 1.9(H)  Alkaline Phos 38 - 126 U/L - 134(H) 133(H)  AST 15 - 41 U/L - 23 21  ALT 0 - 44 U/L - 17 12    Urine Studies No results for input(s): UHGB, CRYS in the last 72 hours.  Invalid input(s): UACOL, UAPR, USPG, UPH, UTP, UGL, UKET, UBIL, UNIT, UROB, ULEU, UEPI, UWBC, URBC, UBAC, CAST, UCOM, BILUA  Basic Metabolic  Panel: Recent Labs  Lab 02/24/18 0948 02/27/18 1237  NA 141 138  K 3.5 3.6  CL 106 104  CO2 22 22  GLUCOSE 177* 129*  BUN 9 11  CREATININE 0.77 0.71  CALCIUM 9.6 9.9   GFR Estimated Creatinine Clearance: 96.6 mL/min (by C-G formula based on SCr of 0.71 mg/dL). Liver Function Tests: Recent Labs  Lab 02/24/18 0948  AST 23  ALT 17  ALKPHOS 134*  BILITOT 1.7*  PROT 7.1  ALBUMIN 3.4*   No results for input(s): LIPASE, AMYLASE in the last 168 hours. No results for input(s): AMMONIA in the last 168 hours. Coagulation profile No results for input(s): INR, PROTIME in the last 168 hours.  CBC: Recent Labs  Lab 02/24/18 0948 02/27/18 1237 02/27/18 2224 02/28/18 0611  WBC 4.1 5.7  --  5.4  NEUTROABS 2.0  --   --   --   HGB 5.5* 5.8* 5.7* 5.3*  HCT 19.0* 20.9*  --  18.6*  MCV 97.4 99.1  --  96.4  PLT 85* 71*  --  70*   Cardiac Enzymes: No results  for input(s): CKTOTAL, CKMB, CKMBINDEX, TROPONINI in the last 168 hours. BNP: Invalid input(s): POCBNP CBG: No results for input(s): GLUCAP in the last 168 hours. D-Dimer No results for input(s): DDIMER in the last 72 hours. Hgb A1c No results for input(s): HGBA1C in the last 72 hours. Lipid Profile No results for input(s): CHOL, HDL, LDLCALC, TRIG, CHOLHDL, LDLDIRECT in the last 72 hours. Thyroid function studies No results for input(s): TSH, T4TOTAL, T3FREE, THYROIDAB in the last 72 hours.  Invalid input(s): FREET3 Anemia work up No results for input(s): VITAMINB12, FOLATE, FERRITIN, TIBC, IRON, RETICCTPCT in the last 72 hours. Microbiology Recent Results (from the past 240 hour(s))  MRSA PCR Screening     Status: None   Collection Time: 02/27/18 10:11 PM  Result Value Ref Range Status   MRSA by PCR NEGATIVE NEGATIVE Final    Comment:        The GeneXpert MRSA Assay (FDA approved for NASAL specimens only), is one component of a comprehensive MRSA colonization surveillance program. It is not intended to  diagnose MRSA infection nor to guide or monitor treatment for MRSA infections. Performed at [facility], [place] [address] [place], [place] [address]       Studies:  Dg Chest 2 View  Result Date: 02/27/2018 CLINICAL DATA:  Right sided CP/flank pain with SOB x 3 days, HTN, past smoker - quit '98, hx of right leg clots EXAM: CHEST - 2 VIEW COMPARISON:  01/01/2018 FINDINGS: Heart size is normal. The lungs are free of focal consolidations and pleural effusions. No pulmonary edema. Surgical clips are present in the LEFT axillary region. Degenerative changes are seen in thoracic spine. IMPRESSION: No evidence for acute cardiopulmonary abnormality. Electronically Signed   By: Nolon Nations M.D.   On: 02/27/2018 13:43   Ct Angio Chest Pe W And/or Wo Contrast  Result Date: 02/27/2018 CLINICAL DATA:  Shortness of breath for the past 2 days. History of breast cancer with possible bone metastases. EXAM: CT ANGIOGRAPHY CHEST WITH CONTRAST TECHNIQUE: Multidetector CT imaging of the chest was performed using the standard protocol during bolus administration of intravenous contrast. Multiplanar CT image reconstructions and MIPs were obtained to evaluate the vascular anatomy. CONTRAST:  23mL ISOVUE-370 IOPAMIDOL (ISOVUE-370) INJECTION 76% COMPARISON:  Chest radiographs obtained earlier today. PET-CT dated 01/15/2018. Chest CTA and abdomen and pelvis CT dated 08/27/2017. FINDINGS: Cardiovascular: Small pericardial effusion with a maximum thickness of 7 mm. Moderate-sized right pulmonary arterial filling defects. These are in the right middle lobe and right lower lobe branches. The right ventricular to left ventricular ratio is normal at 0.84. Mediastinum/Nodes: Mildly enlarged thyroid gland with an incidental 7 mm right lobe nodule. No enlarged lymph nodes. Lungs/Pleura: The interstitial markings in both lower lobes remain mildly prominent. No lung nodules or pleural fluid. Upper Abdomen: Cholecystectomy  clips. Musculoskeletal: Or heterogeneous appearance of the bones with patchy sclerosis and lucency throughout the bony skeleton. The areas of lucency are larger and more confluent than previously demonstrated in multiple thoracic vertebrae and in the upper sternum with some cortical breakthrough posteriorly in the upper sternum with a small amount of associated retrosternal soft tissue density. The new, confluent areas of lucency are most pronounced in the T7 vertebral body with an interval minimal compression deformity of the vertebral body with stable mild compression deformity of the adjacent T8 vertebral body. Postmastectomy changes are again noted on the left. Review of the MIP images confirms the above findings. IMPRESSION: 1. Moderate sized pulmonary emboli on  the right without evidence of right heart strain. 2. Progressive bony metastatic disease as described above. 3. Minimal pericardial effusion. Critical Value/emergent results were called by telephone at the time of interpretation on 02/27/2018 at 2:50 pm to Dr. Veryl Speak , who verbally acknowledged these results. Electronically Signed   By: Claudie Revering M.D.   On: 02/27/2018 14:54    Assessment: 57 y.o. 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  She is a Jehovah witness, and refused blood transfusion, has been on Aranesp injection.  She was found to have lower extremity DVT about a months ago, not compliant with Lovenox injection due to concern of bleeding.  1. PE and DVT, likely related to metastatic cancer, immobility and Aranesp injection, on heparin  2. 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  Metastatic breast cancer to bones and peritoneum, off treatment now 4. HTN 5. Deconditioning    Plan:  -Patient has been refusing blood transfusion due to [place] -I agree with anticoagulation and IVC filter placement.  Patient with a lot  of questions about IVC filter, potential side effects etc.  She is reluctant to commit to long-term anticoagulation due to the risk of bleeding.  But if she is open to blood transfusion, I think anticoagulation is certainly a better option due to her high risk for recurrent thrombosis.  I told her IVC filter is not needed if she agrees to continue full dose anticoagulation and blood transfusion if needed. She will think about it -Patient also stopped exemestane a few weeks ago by herself, I discussed metastatic breast cancer treatment with her last week, she has not decided if she will pursue treatment. -Hospice was previously discussed with her, she has declined. -I will follow-up, please call me if anything I can help    [name], MD 02/28/2018  9:46 AM

## 2018-02-28 NOTE — Consult Note (Signed)
Chief Complaint: Patient was seen in consultation today for IVC filter placement Chief Complaint  Patient presents with  . Chest Pain  . Shortness of Breath    Referring Physician(s): [place]  Supervising Physician: Aletta Edouard  Patient Status: [facility] - In-pt  History of Present Illness: Michaela Little is a 57 y.o. female ,Jehovah's Witness, with history of metastatic left breast cancer, prior right lower extremity DVT diagnosed at [facility] about 1 month ago (Lovenox initiated but patient stopped due to concerns over GI bleed.) She now presents now with dyspnea, lower extremity swelling, cough, severe anemia and multiple PE. Currently on IV heparin but no visible bleeding noted . Labs include hemoglobin 5.3, platelets 70 K, normal creatinine.  Request received for IVC filter placement.  Past Medical History:  Diagnosis Date  . Anxiety   . Arthritis    hands, knees, hips  . Asthma   . Bronchitis   . Cancer ([place])   . Cervical stenosis (uterine cervix)   . Disorder of appendix    Enlarged  . Dyspnea on exertion   . GERD (gastroesophageal reflux disease)   . Headache(784.0)   . History of breast cancer    2010--  LEFT  s/p  mastectomy (in [place]) AND CHEMORADIATION--  NO RECURRENCE  . History of cervical dysplasia   . Hyperlipidemia   . Hypertension   . Malignant neoplasm of overlapping sites of left breast in female, estrogen receptor positive ([place]) 03/05/2013  . OSA (obstructive sleep apnea) moderate osa per study  09/2010   CPAP  , NOT USING ON REGULAR BASIS  . Pelvic pain in female   . Positive H. pylori test    08-05-2013  . Positive TB test    AS TEEN--  [place]/ MEDS  . Refusal of blood transfusions as patient is Jehovah's Witness   . Seasonal allergies   . Uterine fibroid   . Wears glasses     Past Surgical History:  Procedure Laterality Date  . APPENDECTOMY  y  . CARDIOVASCULAR STRESS TEST  10-29-2012   low risk perfusion study/  no significant  reversibity/ ef 66%/  normal wall motion  . CERVICAL CONIZATION W/BX  2012   in  [place] N/A 05/14/2012   Procedure: LAPAROSCOPIC CHOLECYSTECTOMY;  Surgeon: Ralene Ok, MD;  Location: [place];  Service: General;  Laterality: N/A;  . COLONOSCOPY  2011   normal per patient - [place]  . DILATION AND CURETTAGE OF UTERUS  10/15/2011   Procedure: DILATATION AND CURETTAGE;  Surgeon: Melina Schools, MD;  Location: [facility];  Service: Gynecology;  Laterality: N/A;  Conization &  endocervical curettings  . EXAMINATION UNDER ANESTHESIA N/A 08/20/2013   Procedure: EXAM UNDER ANESTHESIA;  Surgeon: Margarette Asal, MD;  Location: [facility];  Service: Gynecology;  Laterality: N/A;  . [place]   right  . LAPAROSCOPIC APPENDECTOMY N/A 08/22/2017   Procedure: [place];  Surgeon: Coralie Keens, MD;  Location: [place];  Service: General;  Laterality: N/A;  . LAPAROSCOPIC ASSISTED VAGINAL HYSTERECTOMY N/A 10/26/2014   Procedure: HYSTERECTOMY ABDOMINAL ;  Surgeon: Molli Posey, MD;  Location: [facility];  Service: Gynecology;  Laterality: N/A;  . MASTECTOMY Left 11/2008  [place]   . SALPINGOOPHORECTOMY Bilateral 10/26/2014   Procedure: BILATERAL SALPINGO OOPHORECTOMY;  Surgeon: Molli Posey, MD;  Location: [facility];  Service: Gynecology;  Laterality: Bilateral;  . TISSUE EXPANDER  PLACEMENT Left 08/22/2017   Procedure: REMOVAL OF LEFT TISSUE EXPANDER;  Surgeon: Crissie Reese, MD;  Location: [place];  Service: Plastics;  Laterality: Left;  . TISSUE EXPANDER REMOVAL Left 08/22/2017  . TRANSTHORACIC ECHOCARDIOGRAM  10-29-2012   mild lvh/  ef 60-65%/  grade II diastolic dysfunction/  trivial mr  &  tr    Allergies: Lisinopril-hydrochlorothiazide; Adhesive [tape]; Fentanyl; Gabapentin; Hctz [hydrochlorothiazide]; Latex; Lisinopril; Losartan potassium; Other; Oxycodone; and Tums [calcium carbonate antacid]  Medications: Prior to  Admission medications   Medication Sig Start Date End Date Taking? Authorizing Provider  cetirizine (ZYRTEC) 10 MG tablet Take 10 mg by mouth daily. 12/19/17  Yes [provider]  fluticasone furoate-vilanterol (BREO ELLIPTA) 100-25 MCG/INH AEPB Inhale 1 puff into the lungs daily. 10/18/17  Yes Martyn Ehrich, NP  folic acid (FOLVITE) 1 MG tablet Take 1 tablet (1 mg total) by mouth daily. 01/05/18  Yes Vann, Jessica U, DO  HYDROcodone-homatropine (HYCODAN) 5-1.5 MG/5ML syrup Take 5 mLs by mouth every 6 (six) hours as needed for cough. 01/29/18  Yes [name], MD  ipratropium-albuterol (DUONEB) 0.5-2.5 (3) MG/3ML SOLN INHALE THREE MLS VIA NEBULIZER EVERY 6 HOURS AS NEEDED Patient taking differently: Take 3 mLs by nebulization every 6 (six) hours as needed (for wheezing or shortness of breath).  02/13/18  Yes Clent Demark, PA-C  promethazine (PHENERGAN) 25 MG tablet Take 1 tablet (25 mg total) by mouth every 6 (six) hours as needed for nausea or vomiting. 12/19/17  Yes Alla Feeling, NP  vitamin B-12 1000 MCG tablet Take 1 tablet (1,000 mcg total) by mouth daily. 01/05/18  Yes Vann, Jessica U, DO  carbamide peroxide (DEBROX) 6.5 % OTIC solution Place 5 drops into both ears 2 (two) times daily. Patient not taking: Reported on 02/27/2018 01/09/18   Clent Demark, PA-C  chlorpheniramine-HYDROcodone [facility] ER) 10-8 MG/5ML SUER Take 5 mLs by mouth every 12 (twelve) hours as needed for cough. Patient not taking: Reported on 02/27/2018 01/09/18   Clent Demark, PA-C  diphenhydrAMINE (BENADRYL) 25 mg capsule Take 1 capsule (25 mg total) by mouth every 6 (six) hours as needed for itching. Patient not taking: Reported on 02/27/2018 01/04/18   Radene Gunning, NP  exemestane (AROMASIN) 25 MG tablet Take 1 tablet (25 mg total) by mouth daily after breakfast. 02/13/18   [name], MD  furosemide (LASIX) 20 MG tablet Take 1 tablet (20 mg total) by mouth daily as needed (for  fluid retention.). Patient not taking: Reported on 02/27/2018 09/25/17   Clent Demark, PA-C  montelukast (SINGULAIR) 10 MG tablet Take 1 tablet (10 mg total) by mouth at bedtime. Patient not taking: Reported on 02/27/2018 10/10/17   Clent Demark, PA-C  pantoprazole (PROTONIX) 40 MG tablet Take 1 tablet (40 mg total) by mouth daily. Take 30-60 min before first meal of the day Patient not taking: Reported on 02/27/2018 01/01/17   [name], MD     Family History  Problem Relation Age of Onset  . Breast cancer Mother   . Colon cancer Mother   . Hypotension Mother   . Asthma Mother   . Diabetes type II Mother   . Arthritis Mother   . Clotting disorder Mother   . Cancer Mother        breast/colon  . Mental illness Brother   . Heart disease Brother   . Cerebral palsy Daughter   . Emphysema Brother  never smoker  . Colon cancer Maternal Aunt   . Cancer Maternal Aunt        colon  . Colon cancer Maternal Uncle   . Cancer Maternal Uncle        colon  . Esophageal cancer Neg Hx   . Rectal cancer Neg Hx   . Stomach cancer Neg Hx   . Thyroid disease Neg Hx     Social History   Socioeconomic History  . Marital status: Single    Spouse name: Not on file  . Number of children: 5  . Years of education: Not on file  . Highest education level: Not on file  Occupational History  . Occupation: Disabled  Social Needs  . Financial resource strain: Not on file  . Food insecurity:    Worry: Not on file    Inability: Not on file  . Transportation needs:    Medical: Not on file    Non-medical: Not on file  Tobacco Use  . Smoking status: Former Smoker    Packs/day: 0.30    Years: 10.00    Pack years: 3.00    Types: Cigarettes    Last attempt to quit: 03/26/1988    Years since quitting: 29.9  . Smokeless tobacco: Never Used  Substance and Sexual Activity  . Alcohol use: No  . Drug use: No  . Sexual activity: Not Currently  Lifestyle  . Physical activity:     Days per week: Not on file    Minutes per session: Not on file  . Stress: Not on file  Relationships  . Social connections:    Talks on phone: Not on file    Gets together: Not on file    Attends religious service: Not on file    Active member of club or organization: Not on file    Attends meetings of clubs or organizations: Not on file    Relationship status: Not on file  Other Topics Concern  . Not on file  Social History Narrative  . Not on file     Review of Systems currently denies fever, headache, chest pain, worsening dyspnea, back pain, nausea, or abnormal bleeding. She does have occasional cough , occasional left-sided abdominal discomfort , anxiety and occasional dry heaves with cough  Vital Signs: BP (!) 151/67 (BP Location: Right Arm)   Pulse 97   Temp 98.5 F (36.9 C) (Axillary)   Resp 16   Wt 241 lb 13.5 oz (109.7 kg)   LMP 02/25/2009   SpO2 99%   BMI 37.88 kg/m   Physical Exam awake, alert.  Chest clear to auscultation bilaterally.  Heart with regular rate and rhythm.  Abdomen obese, soft, positive bowel sounds, mild left lateral tenderness to palpation. Ext with full range of motion. Imaging: Dg Chest 2 View  Result Date: 02/27/2018 CLINICAL DATA:  Right sided CP/flank pain with SOB x 3 days, HTN, past smoker - quit '98, hx of right leg clots EXAM: CHEST - 2 VIEW COMPARISON:  01/01/2018 FINDINGS: Heart size is normal. The lungs are free of focal consolidations and pleural effusions. No pulmonary edema. Surgical clips are present in the LEFT axillary region. Degenerative changes are seen in thoracic spine. IMPRESSION: No evidence for acute cardiopulmonary abnormality. Electronically Signed   By: Nolon Nations M.D.   On: 02/27/2018 13:43   Ct Angio Chest Pe W And/or Wo Contrast  Result Date: 02/27/2018 CLINICAL DATA:  Shortness of breath for the past 2 days. History of  breast cancer with possible bone metastases. EXAM: CT ANGIOGRAPHY CHEST WITH CONTRAST  TECHNIQUE: Multidetector CT imaging of the chest was performed using the standard protocol during bolus administration of intravenous contrast. Multiplanar CT image reconstructions and MIPs were obtained to evaluate the vascular anatomy. CONTRAST:  56mL ISOVUE-370 IOPAMIDOL (ISOVUE-370) INJECTION 76% COMPARISON:  Chest radiographs obtained earlier today. PET-CT dated 01/15/2018. Chest CTA and abdomen and pelvis CT dated 08/27/2017. FINDINGS: Cardiovascular: Small pericardial effusion with a maximum thickness of 7 mm. Moderate-sized right pulmonary arterial filling defects. These are in the right middle lobe and right lower lobe branches. The right ventricular to left ventricular ratio is normal at 0.84. Mediastinum/Nodes: Mildly enlarged thyroid gland with an incidental 7 mm right lobe nodule. No enlarged lymph nodes. Lungs/Pleura: The interstitial markings in both lower lobes remain mildly prominent. No lung nodules or pleural fluid. Upper Abdomen: Cholecystectomy clips. Musculoskeletal: Or heterogeneous appearance of the bones with patchy sclerosis and lucency throughout the bony skeleton. The areas of lucency are larger and more confluent than previously demonstrated in multiple thoracic vertebrae and in the upper sternum with some cortical breakthrough posteriorly in the upper sternum with a small amount of associated retrosternal soft tissue density. The new, confluent areas of lucency are most pronounced in the T7 vertebral body with an interval minimal compression deformity of the vertebral body with stable mild compression deformity of the adjacent T8 vertebral body. Postmastectomy changes are again noted on the left. Review of the MIP images confirms the above findings. IMPRESSION: 1. Moderate sized pulmonary emboli on the right without evidence of right heart strain. 2. Progressive bony metastatic disease as described above. 3. Minimal pericardial effusion. Critical Value/emergent results were called by  telephone at the time of interpretation on 02/27/2018 at 2:50 pm to Dr. Veryl Speak , who verbally acknowledged these results. Electronically Signed   By: Claudie Revering M.D.   On: 02/27/2018 14:54    Labs:  CBC: Recent Labs    02/18/18 1046 02/24/18 0948 02/27/18 1237 02/27/18 2224 02/28/18 0611 02/28/18 1430  WBC 3.9* 4.1 5.7  --  5.4  --   HGB 5.0* 5.5* 5.8* 5.7* 5.3* 5.5*  HCT 17.0* 19.0* 20.9*  --  18.6*  --   PLT 103* 85* 71*  --  70*  --     COAGS: Recent Labs    01/01/18 1637 02/28/18 1430  INR 1.25 1.31  APTT 27  --     BMP: Recent Labs    01/06/18 1002 02/11/18 1109 02/24/18 0948 02/27/18 1237  NA 141 142 141 138  K 3.7 3.7 3.5 3.6  CL 108 108 106 104  CO2 24 24 22 22   GLUCOSE 157* 146* 177* 129*  BUN 13 8 9 11   CALCIUM 8.8* 9.5 9.6 9.9  CREATININE 0.71 0.71 0.77 0.71  GFRNONAA >60 >60 >60 >60  GFRAA >60 >60 >60 >60    LIVER FUNCTION TESTS: Recent Labs    12/23/17 1044 01/06/18 1002 02/11/18 1109 02/24/18 0948  BILITOT 2.6* 2.3* 1.9* 1.7*  AST 27 38 21 23  ALT 17 17 12 17   ALKPHOS 136* 132* 133* 134*  PROT 7.5 7.6 7.2 7.1  ALBUMIN 3.8 4.0 3.2* 3.4*    TUMOR MARKERS: No results for input(s): AFPTM, CEA, CA199, CHROMGRNA in the last 8760 hours.  Assessment and Plan: 57 y.o. female ,Jehovah's Witness, with history of metastatic left breast cancer, prior right lower extremity DVT diagnosed at [facility] about 1 month ago (Lovenox initiated but  patient stopped due to concerns over GI bleed.) She now presents now with dyspnea, lower extremity swelling, cough, severe anemia and multiple PE. Currently on IV heparin but no visible bleeding noted . Labs include hemoglobin 5.3, platelets 70 K, normal creatinine.  Request received for IVC filter placement.Case has been reviewed by Dr. Kathlene Cote.  Risks and benefits discussed with the patient/family including, but not limited to bleeding, infection, contrast induced renal failure, filter fracture or  migration which can lead to emergency surgery or even death, strut penetration with damage or irritation to adjacent structures and caval thrombosis.  All of the patient's questions were answered, patient is agreeable to proceed. Consent signed and in chart.  Procedure tent scheduled for 12/7   Thank you for this interesting consult.  I greatly enjoyed meeting [employer] and look forward to participating in their care.  A copy of this report was sent to the requesting provider on this date.  Electronically Signed: D. Rowe Robert, PA-C 02/28/2018, 3:44 PM   I spent a total of 40 minutes    in face to face in clinical consultation, greater than 50% of which was counseling/coordinating care for IVC filter placement

## 2018-02-28 NOTE — Care Management (Signed)
#    3.  S/W ANTHONY  & JESSICA  @ [employer] # [phone]  ELIQUIS  10 MG BID : NONE FORMULARY  1. ELIQUIS   2.5 MG BID   AND  ELIQUIS  5 MG BID COVER- YES [employer]- ZERO DOLLARS FOR EACH PRESCRIPTION TIER- NO PRIOR APPROVAL- NO  2. XARELTO   15 MG BID   AND  XARELTO  20 MG DAILY COVER- YES CO-PAY- ZERO DOLLARS  FOR EACH PRESCRIPTION TIER- NO PRIOR APPROVAL- NO   14 SYRINGES 3. LOVENOX 120 MG  SQ EVERY 12 HRS X  1 WEEK   COVER- NOT COVER PRIOR APPROVAL- YES # [phone]  3. ENOXAPARIN 120 MG  A  SQ EVERY 12 HRS X 1 WEEK COVER- YES CO-PA[employer]-  Q/L  TWO SYRINGES PER DAY -OVER TIER- 4 DRUG PRIOR APPROVAL- YES [phone]  PREFERRED PHARMACY :  YES [facility]  AND [facility]  SECONDARY INS: MEDICAID OF [place] EFF-DATE : 09-23-2017 CO-PAY- $ 3.80 FOR EACH PRESCRIPTION

## 2018-02-28 NOTE — Progress Notes (Signed)
[place] for Heparin Indication: pulmonary embolus  Allergies  Allergen Reactions  . Lisinopril-Hydrochlorothiazide Itching  . Adhesive [Tape] Itching and Other (See Comments)    Redness  . Fentanyl Other (See Comments)    GI upset and drowsiness  . Gabapentin Other (See Comments)    Auditory hallucinations  . Hctz [Hydrochlorothiazide] Itching  . Latex Itching  . Lisinopril Itching  . Losartan Potassium Other (See Comments)    Makes her feel "bad"   . Other Other (See Comments)    Patient refuses blood for religious reasons (per her notes)  . Oxycodone     GI upset, drowsy  . Tums [Calcium Carbonate Antacid] Hives    FRUIT-FLAVORED ONES    Patient Measurements: IBW 61.6 kg Weight: 243 lb (110.2 kg) Heparin Dosing Weight:  87kg   Vital Signs: Temp: 99.1 F (37.3 C) (12/06 0120) Temp Source: Oral (12/06 0120) BP: 160/84 (12/06 0039) Pulse Rate: 98 (12/06 0039)  Labs: Recent Labs    02/27/18 1237 02/27/18 2224 02/28/18 0252  HGB 5.8* 5.7*  --   HCT 20.9*  --   --   PLT 71*  --   --   HEPARINUNFRC  --   --  0.54  CREATININE 0.71  --   --     Estimated Creatinine Clearance: 96.8 mL/min (by C-G formula based on SCr of 0.71 mg/dL).   Medical History: Past Medical History:  Diagnosis Date  . Anxiety   . Arthritis    hands, knees, hips  . Asthma   . Bronchitis   . Cancer ([place])   . Cervical stenosis (uterine cervix)   . Disorder of appendix    Enlarged  . Dyspnea on exertion   . GERD (gastroesophageal reflux disease)   . Headache(784.0)   . History of breast cancer    2010--  LEFT  s/p  mastectomy (in [place]) AND CHEMORADIATION--  NO RECURRENCE  . History of cervical dysplasia   . Hyperlipidemia   . Hypertension   . Malignant neoplasm of overlapping sites of left breast in female, estrogen receptor positive ([place]) 03/05/2013  . OSA (obstructive sleep apnea) moderate osa per study  09/2010   CPAP  , NOT USING ON  REGULAR BASIS  . Pelvic pain in female   . Positive H. pylori test    08-05-2013  . Positive TB test    AS TEEN--  [place] W/ MEDS  . Refusal of blood transfusions as patient is Jehovah's Witness   . Seasonal allergies   . Uterine fibroid   . Wears glasses     Assessment: CC/HPI: dyspnea, cough and pleuritic pain, R thigh swelling. ER w/u revealed mod sized R PE   PMH: Jehovah's witness with hx HTN, OSA, obesity, stage IIIa metastatic breast cancer, previous DVT with course c/b bleeding (anticoag was stopped)., anxiety, arthritis, asthma, bronchitis, GERD, cervical dysplasia, HLD, Positive H. pylori test, Positive TB test  Anticoag: Hgb 5.8. Plts 71 (her average). H/o DVT previously on lovenox, stopped r/t worsening anemia.  Pt was apparently told to restart but stopped on her own due to risk of bleeding.   12/6 AM update: heparin level is very close to goal at 0.54 (goal 0.3-0.5), may see some downward trend further from the bolus, will not change rate for now.   Goal of Therapy:  Heparin level 0.3-0.5 units/ml Monitor platelets by anticoagulation protocol: Yes   Plan:  Cont heparin at 1400 units/hr Confirmatory heparin level at  [place], PharmD, [place] Clinical Pharmacist Phone: [phone]

## 2018-02-28 NOTE — Progress Notes (Signed)
[facility] HOSPITALIST PROGRESS NOTE  Michaela Little KXF:[id number] DOB: 21-Dec-1960 DOA: 02/27/2018 PCP: Clent Demark, PA-C   Narrative: 57 year old female Known history of left breast cancer stage IIIa status post left mastectomy/XRT/tamoxifen with bony mets-intent of care of her treatment for cancer is palliative Jehovah's Witness refusing blood transfusion usual hemoglobins in the 4-5 range-takes Aranesp injections 500 q. 2 weeks COPD Right lower extremity DVT discovered at [facility] not on anticoagulation supposed to be on Lovenox which she stopped taking on her own because of GI bleed  Recent hospitalization 10/9-10/12 for symptomatic anemia with hemoglobin in the 3 range  A & Plan New pulmonary embolism in setting Hypercoagulable state from cancer-continmue heparin GTT for now-thinkiing about IVC filter-long discussion with he rand family at bedside about risks and benefits--asking IR to see today fr procedure--if she is not willing after hearing about risks and benefits from them then this can be cancelled Left breast cancer status post mastectomy S RT on tamoxifen intent of care palliative not currently on chemotherapy-OP follow up with Oncologist Severe anemia with Jehovah's Witness complicating-Aranesp every 2 weeks-recheck labs in the am-continue Nulecit 125 as prn COPD Goals of care-very gaurded prognosis-needs further discussion with Oncologist and pallaitve care  DVT prophylaxis: on IV heparin  Code Status: full cod3   Family Communication: daughter bedside   Disposition Plan: inpatient    Verlon Au, MD  [facility] Direct contact: [phone] --Via amion app OR  --[email or web address]; password [email or web address]  7PM-7AM contact night coverage as above 02/28/2018, 9:22 AM  LOS: 1 day   Consultants:  CCM  Oncologist  Procedures:  n  Antimicrobials:  n  Interval history/Subjective:  No pain some cough no fever no chill many questions about  care   Objective:  Vitals:  Vitals:   02/28/18 0759 02/28/18 0840  BP: (!) 153/78   Pulse: 97   Resp:    Temp: 98.6 F (37 C)   SpO2: 98% 100%    Exam:  . eomi ncat no ict no pallor . cta b no added sound . s1 s 2 slight tachy . Abd soft nt nd no rebound no guard . Neuro intact skin no edema . Affect flat   I have personally reviewed the following:   Labs:  Hemoglobin 5.3 down from 5.7, PLT 70  Scheduled Meds: . fluticasone furoate-vilanterol  1 puff Inhalation Daily  . folic acid  1 mg Oral Daily  . Influenza vac split quadrivalent PF  0.5 mL Intramuscular Tomorrow-1000  . ipratropium-albuterol  3 mL Nebulization TID  . loratadine  10 mg Oral Daily  . montelukast  10 mg Oral QHS  . pantoprazole  40 mg Oral Daily  . sodium chloride flush  3 mL Intravenous Q12H  . cyanocobalamin  1,000 mcg Oral Daily   Continuous Infusions: . sodium chloride 250 mL (02/28/18 0050)  . heparin 1,400 Units/hr (02/27/18 1936)    Active Problems:   Pulmonary embolism ([place])   LOS: 1 day

## 2018-02-28 NOTE — Progress Notes (Signed)
[place] for Heparin Indication: pulmonary embolus  Allergies  Allergen Reactions  . Lisinopril-Hydrochlorothiazide Itching  . Adhesive [Tape] Itching and Other (See Comments)    Redness  . Fentanyl Other (See Comments)    GI upset and drowsiness  . Gabapentin Other (See Comments)    Auditory hallucinations  . Hctz [Hydrochlorothiazide] Itching  . Latex Itching  . Lisinopril Itching  . Losartan Potassium Other (See Comments)    Makes her feel "bad"   . Other Other (See Comments)    Patient refuses blood for religious reasons (per her notes)  . Oxycodone     GI upset, drowsy  . Tums [Calcium Carbonate Antacid] Hives    FRUIT-FLAVORED ONES    Patient Measurements: IBW 61.6 kg Weight: 241 lb 13.5 oz (109.7 kg) Heparin Dosing Weight:  87kg   Vital Signs: Temp: 98.6 F (37 C) (12/06 0759) Temp Source: Oral (12/06 0759) BP: 153/78 (12/06 0759) Pulse Rate: 97 (12/06 0759)  Labs: Recent Labs    02/27/18 1237 02/27/18 2224 02/28/18 0252 02/28/18 0611 02/28/18 1128  HGB 5.8* 5.7*  --  5.3*  --   HCT 20.9*  --   --  18.6*  --   PLT 71*  --   --  70*  --   HEPARINUNFRC  --   --  0.54  --  0.54  CREATININE 0.71  --   --   --   --     Estimated Creatinine Clearance: 96.6 mL/min (by C-G formula based on SCr of 0.71 mg/dL).  Assessment: 57 y.o. F with h/o stage IIIa metastatic breast cancer, previous DVT. Found to have moderate sized PE. Pt previously on lovenox for DVT, stopped d/t worsening anemia. Pt was apparently told to restart but stopped on her own due to risk of bleeding. **Pt is Jehovah's witness so refusing blood transfusions**  Pt on heparin this admission for acute PE. Heparin level remains slightly above lower goal (0.54) on gtt at 1400 units/hr. Hgb 5.3 (usual Hgb in 4-5 range) and plt 70 (also at baseline). No bleeding noted. Hem/onc following and ok with continuing heparin.   Goal of Therapy:  Heparin level  0.3-0.5 units/ml Monitor platelets by anticoagulation protocol: Yes   Plan:  Decrease heparin to 1300 units/hr Will f/u 6 hr heparin level F/u longterm anticoagulation plans  Sherlon Handing, PharmD, BCPS Clinical pharmacist 02/28/2018 1:01 PM  **Pharmacist phone directory can now be found on [email or web address] ([email or web address]).  Listed under [place].

## 2018-02-28 NOTE — Plan of Care (Signed)

## 2018-03-01 ENCOUNTER — Inpatient Hospital Stay (HOSPITAL_COMMUNITY): Payer: Medicare Other

## 2018-03-01 ENCOUNTER — Encounter (HOSPITAL_COMMUNITY): Payer: Self-pay | Admitting: Interventional Radiology

## 2018-03-01 DIAGNOSIS — I2699 Other pulmonary embolism without acute cor pulmonale: Secondary | ICD-10-CM

## 2018-03-01 HISTORY — PX: IR IVC FILTER PLMT / S&I /IMG GUID/MOD SED: IMG701

## 2018-03-01 LAB — COMPREHENSIVE METABOLIC PANEL
ALT: 15 U/L (ref 0–44)
AST: 22 U/L (ref 15–41)
Albumin: 3.5 g/dL (ref 3.5–5.0)
Alkaline Phosphatase: 114 U/L (ref 38–126)
Anion gap: 12 (ref 5–15)
BUN: 10 mg/dL (ref 6–20)
CO2: 23 mmol/L (ref 22–32)
Calcium: 10 mg/dL (ref 8.9–10.3)
Chloride: 103 mmol/L (ref 98–111)
Creatinine, Ser: 0.81 mg/dL (ref 0.44–1.00)
GFR calc Af Amer: >60 mL/min (ref >60)
GFR calc non Af Amer: >60 mL/min (ref >60)
Glucose, Bld: 131 mg/dL — ABNORMAL HIGH (ref 70–99)
Potassium: 4 mmol/L (ref 3.5–5.1)
SODIUM: 138 mmol/L (ref 135–145)
Total Bilirubin: 2 mg/dL — ABNORMAL HIGH (ref 0.3–1.2)
Total Protein: 7 g/dL (ref 6.5–8.1)

## 2018-03-01 LAB — CBC WITH DIFFERENTIAL/PLATELET
Abs Immature Granulocytes: 0.4 10*3/uL — ABNORMAL HIGH (ref 0.00–0.07)
Band Neutrophils: 2 %
Basophils Absolute: 0 10*3/uL (ref 0.0–0.1)
Basophils Relative: 0 %
Eosinophils Absolute: 0.1 10*3/uL (ref 0.0–0.5)
Eosinophils Relative: 2 %
HCT: 18.8 % — ABNORMAL LOW (ref 36.0–46.0)
Hemoglobin: 5.4 g/dL — CL (ref 12.0–15.0)
Lymphocytes Relative: 22 %
Lymphs Abs: 1.4 10*3/uL (ref 0.7–4.0)
MCH: 27.6 pg (ref 26.0–34.0)
MCHC: 28.7 g/dL — ABNORMAL LOW (ref 30.0–36.0)
MCV: 95.9 fL (ref 80.0–100.0)
Metamyelocytes Relative: 6 %
Monocytes Absolute: 0.3 10*3/uL (ref 0.1–1.0)
Monocytes Relative: 5 %
Neutro Abs: 4.1 10*3/uL (ref 1.7–7.7)
Neutrophils Relative %: 63 %
Platelets: 83 10*3/uL — ABNORMAL LOW (ref 150–400)
RBC: 1.96 MIL/uL — ABNORMAL LOW (ref 3.87–5.11)
RDW: 19.9 % — AB (ref 11.5–15.5)
WBC: 6.3 10*3/uL (ref 4.0–10.5)
nRBC: 0.8 % — ABNORMAL HIGH (ref 0.0–0.2)
nRBC: 13 /100 WBC — ABNORMAL HIGH

## 2018-03-01 LAB — RENAL FUNCTION PANEL
Albumin: 3.5 g/dL (ref 3.5–5.0)
Anion gap: 11 (ref 5–15)
BUN: 10 mg/dL (ref 6–20)
CO2: 23 mmol/L (ref 22–32)
Calcium: 10 mg/dL (ref 8.9–10.3)
Chloride: 103 mmol/L (ref 98–111)
Creatinine, Ser: 0.79 mg/dL (ref 0.44–1.00)
GFR calc Af Amer: >60 mL/min (ref >60)
GFR calc non Af Amer: >60 mL/min (ref >60)
GLUCOSE: 130 mg/dL — AB (ref 70–99)
Phosphorus: 4.5 mg/dL (ref 2.5–4.6)
Potassium: 4 mmol/L (ref 3.5–5.1)
Sodium: 137 mmol/L (ref 135–145)

## 2018-03-01 LAB — HEPARIN LEVEL (UNFRACTIONATED): Heparin Unfractionated: 0.48 IU/mL (ref 0.30–0.70)

## 2018-03-01 MED ORDER — LIDOCAINE HCL 1 % IJ SOLN
INTRAMUSCULAR | Status: AC
  Filled 2018-03-01: qty 20

## 2018-03-01 MED ORDER — FENTANYL CITRATE (PF) 100 MCG/2ML IJ SOLN
INTRAMUSCULAR | Status: AC | PRN
  Administered 2018-03-01 (×2): 50 ug via INTRAVENOUS

## 2018-03-01 MED ORDER — MIDAZOLAM HCL 2 MG/2ML IJ SOLN
INTRAMUSCULAR | Status: AC | PRN
  Administered 2018-03-01 (×2): 1 mg via INTRAVENOUS

## 2018-03-01 MED ORDER — FENTANYL CITRATE (PF) 100 MCG/2ML IJ SOLN
INTRAMUSCULAR | Status: AC
  Filled 2018-03-01: qty 2

## 2018-03-01 MED ORDER — IOPAMIDOL (ISOVUE-300) INJECTION 61%
INTRAVENOUS | Status: AC
  Administered 2018-03-01: 35 mL
  Filled 2018-03-01: qty 100

## 2018-03-01 MED ORDER — MIDAZOLAM HCL 2 MG/2ML IJ SOLN
INTRAMUSCULAR | Status: AC
  Filled 2018-03-01: qty 2

## 2018-03-01 MED ORDER — LIDOCAINE HCL (PF) 1 % IJ SOLN
INTRAMUSCULAR | Status: AC | PRN
  Administered 2018-03-01: 5 mL

## 2018-03-01 MED ORDER — HYDROMORPHONE HCL 1 MG/ML IJ SOLN
INTRAMUSCULAR | Status: AC
  Filled 2018-03-01: qty 1

## 2018-03-01 NOTE — Progress Notes (Signed)
[facility] HOSPITALIST PROGRESS NOTE  Michaela Little EYC:[id number] DOB: 1960-05-22 DOA: 02/27/2018 PCP: Clent Demark, PA-C  Narrative:  57 year old female Known history of left breast cancer stage IIIa status post left mastectomy/XRT/tamoxifen with bony mets-intent of care of her treatment for cancer is palliative Jehovah's Witness refusing blood transfusion usual hemoglobins in the 4-5 range-takes Aranesp injections 500 q. 2 weeks COPD Right lower extremity DVT discovered at [facility] not on anticoagulation supposed to be on Lovenox which she stopped taking on her own because of GI bleed  Recent hospitalization 10/9-10/12 for symptomatic anemia with hemoglobin in the 3 range  A & Plan New pulmonary embolism in setting Hypercoagulable state from cancer-continue IV heparin-had filter placed without issue-will discuss further need for contiued control of risk of PE in am as d/w oncology and Filter wouldn't really prevent decrease of burden of PE without AC  Left breast cancer status post mastectomy S RT on tamoxifen intent of care palliative not currently on chemotherapy-OP follow up with Oncologist  Severe anemia with Jehovah's Witness complicating-Aranesp every 2 weeks-recheck labs in the am-continue Nulecit 125 as prn  COPD Goals of care-very gaurded prognosis-needs further discussion with Oncologist and pallaitve care  DVT prophylaxis: on IV heparin  Code Status: full cod3   Family Communication: daughter bedside   Disposition Plan: inpatient- can d/c in am if all stable and decisions abotu AC decided   Verlon Au, MD  [facility] Direct contact: [phone] --Via [place]  --[email or web address]; password [email or web address]  7PM-7AM contact night coverage as above 03/01/2018, 8:50 AM  LOS: 2 days   Consultants:  CCM  Oncologist  Procedures:  n  Antimicrobials:  n  Interval history/Subjective:  Awake alert pleasan tin nad tol diet Had procedure today without  issue   Objective:  Vitals:  Vitals:   03/01/18 0433 03/01/18 0816  BP: (!) 155/82 131/61  Pulse: 99 96  Resp: 18 13  Temp: 98.4 F (36.9 C) 98.2 F (36.8 C)  SpO2: 98% 99%    Exam:  . eomi ncat no ict no pallor . cta b no added sound . s1 s 2 slight tachy . Abd soft nt nd no rebound no guard . Neuro intact skin no edema . Affect flat   I have personally reviewed the following:   Labs:  Hemoglobin 5.3-5.4, platelet 83  LFTs normal  Scheduled Meds: . iopamidol      . lidocaine      . fluticasone furoate-vilanterol  1 puff Inhalation Daily  . folic acid  1 mg Oral Daily  . Influenza vac split quadrivalent PF  0.5 mL Intramuscular Tomorrow-1000  . loratadine  10 mg Oral Daily  . montelukast  10 mg Oral QHS  . pantoprazole  40 mg Oral Daily  . sodium chloride flush  3 mL Intravenous Q12H  . cyanocobalamin  1,000 mcg Oral Daily   Continuous Infusions: . sodium chloride 250 mL (02/28/18 0050)  . heparin 1,300 Units/hr (03/01/18 5885)    Active Problems:   Pulmonary embolism ([place])   Metastatic breast cancer ([place])   LOS: 2 days

## 2018-03-01 NOTE — Procedures (Signed)
Interventional Radiology Procedure Note  Procedure: IVC filter placement  Complications: None  Estimated Blood Loss: < 10 mL  Findings: Normally patent IVC. [place] IVC filter placed in infrarenal IVC.  Venetia Night. Kathlene Cote, M.D Pager:  [phone]

## 2018-03-01 NOTE — Progress Notes (Signed)
Bilateral lower extremities venous duplex exam completed.  Positive acute deep vein thrombosis involving the right common femoral vein, right femoral vein, right proximal profunda vein, right popliteal vein, and right posterior tibial vein. Findings consistent with Acute superficial vein thrombosis involving the right great saphenous vein.  Please see preliminary notes on CV PROC under chart review.  Estuardo Frisbee H Ozan Maclay(RDMS RVT) 03/01/18 12:14 PM

## 2018-03-01 NOTE — Progress Notes (Signed)
[place] for Heparin Indication: pulmonary embolus  Allergies  Allergen Reactions  . Lisinopril-Hydrochlorothiazide Itching  . Adhesive [Tape] Itching and Other (See Comments)    Redness  . Fentanyl Other (See Comments)    GI upset and drowsiness. Only to [facility]  . Gabapentin Other (See Comments)    Auditory hallucinations  . Hctz [Hydrochlorothiazide] Itching  . Latex Itching  . Lisinopril Itching  . Losartan Potassium Other (See Comments)    Makes her feel "bad"   . Other Other (See Comments)    Patient refuses blood for religious reasons (per her notes)  . Oxycodone     GI upset, drowsy  . Tums [Calcium Carbonate Antacid] Hives    FRUIT-FLAVORED ONES    Patient Measurements: IBW 61.6 kg Weight: 238 lb (108 kg) Heparin Dosing Weight:  87kg   Vital Signs: Temp: 98.2 F (36.8 C) (12/07 0816) Temp Source: Oral (12/07 0816) BP: 143/79 (12/07 1000) Pulse Rate: 98 (12/07 1000)  Labs: Recent Labs    02/27/18 1237  02/28/18 0611  02/28/18 1430 02/28/18 1926 02/28/18 2134 03/01/18 0610  HGB 5.8*   < > 5.3*  --  5.5*  --  5.3* 5.4*  HCT 20.9*  --  18.6*  --   --   --   --  18.8*  PLT 71*  --  70*  --   --   --   --  83*  LABPROT  --   --   --   --  16.1*  --   --   --   INR  --   --   --   --  1.31  --   --   --   HEPARINUNFRC  --    < >  --    < >  --  >2.20* 0.39 0.48  CREATININE 0.71  --   --   --   --   --   --  0.81  0.79   < > = values in this interval not displayed.    Estimated Creatinine Clearance: 95.9 mL/min (by C-G formula based on SCr of 0.79 mg/dL).   Medical History: Past Medical History:  Diagnosis Date  . Anxiety   . Arthritis    hands, knees, hips  . Asthma   . Bronchitis   . Cancer ([place])   . Cervical stenosis (uterine cervix)   . Disorder of appendix    Enlarged  . Dyspnea on exertion   . GERD (gastroesophageal reflux disease)   . Headache(784.0)   . History of breast cancer    2010--  LEFT  s/p  mastectomy (in [place]) AND CHEMORADIATION--  NO RECURRENCE  . History of cervical dysplasia   . Hyperlipidemia   . Hypertension   . Malignant neoplasm of overlapping sites of left breast in female, estrogen receptor positive ([place]) 03/05/2013  . OSA (obstructive sleep apnea) moderate osa per study  09/2010   CPAP  , NOT USING ON REGULAR BASIS  . Pelvic pain in female   . Positive H. pylori test    08-05-2013  . Positive TB test    AS TEEN--  [place] W/ MEDS  . Refusal of blood transfusions as patient is Jehovah's Witness   . Seasonal allergies   . Uterine fibroid   . Wears glasses     Assessment: CC/HPI: dyspnea, cough and pleuritic pain, R thigh swelling. ER w/u revealed mod sized R PE   PMH:  Jehovah's witness with hx HTN, OSA, obesity, stage IIIa metastatic breast cancer, previous DVT with course c/b bleeding (anticoag was stopped)., anxiety, arthritis, asthma, bronchitis, GERD, cervical dysplasia, HLD, Positive H. pylori test, Positive TB test  Anticoag: Hgb 5.4 - stable. Plts up to 83 (her average). H/o DVT previously on lovenox, stopped r/t worsening anemia.  Pt was apparently told to restart but stopped on her own due to risk of bleeding. IVC filter placed 12/7.  Heparin level remains within target range at 0.48. No active bleed issues documented.   Goal of Therapy:  Heparin level 0.3-0.5 units/ml Monitor platelets by anticoagulation protocol: Yes   Plan:  Continue heparin at 1300 units/hr Monitor daily heparin level and CBC, s/sx bleeding F/u long-term plans for anticoagulation  Elicia Lamp, PharmD, BCPS Clinical Pharmacist Clinical phone [phone] Please check AMION for all [place] contact numbers 03/01/2018 11:14 AM

## 2018-03-02 ENCOUNTER — Inpatient Hospital Stay (HOSPITAL_COMMUNITY): Payer: Medicare Other

## 2018-03-02 DIAGNOSIS — I370 Nonrheumatic pulmonary valve stenosis: Secondary | ICD-10-CM

## 2018-03-02 LAB — ECHOCARDIOGRAM COMPLETE: WEIGHTICAEL: 3876.8 [oz_av]

## 2018-03-02 LAB — CBC WITH DIFFERENTIAL/PLATELET
Abs Immature Granulocytes: 0.25 10*3/uL — ABNORMAL HIGH (ref 0.00–0.07)
Basophils Absolute: 0.1 10*3/uL (ref 0.0–0.1)
Basophils Relative: 1 %
EOS ABS: 0.1 10*3/uL (ref 0.0–0.5)
EOS PCT: 3 %
HCT: 17 % — ABNORMAL LOW (ref 36.0–46.0)
HEMOGLOBIN: 4.9 g/dL — AB (ref 12.0–15.0)
Immature Granulocytes: 5 %
Lymphocytes Relative: 37 %
Lymphs Abs: 1.8 10*3/uL (ref 0.7–4.0)
MCH: 27.8 pg (ref 26.0–34.0)
MCHC: 28.8 g/dL — ABNORMAL LOW (ref 30.0–36.0)
MCV: 96.6 fL (ref 80.0–100.0)
Monocytes Absolute: 0.5 10*3/uL (ref 0.1–1.0)
Monocytes Relative: 10 %
Neutro Abs: 2.1 10*3/uL (ref 1.7–7.7)
Neutrophils Relative %: 44 %
Platelets: 80 10*3/uL — ABNORMAL LOW (ref 150–400)
RBC: 1.76 MIL/uL — ABNORMAL LOW (ref 3.87–5.11)
RDW: 19.8 % — ABNORMAL HIGH (ref 11.5–15.5)
Smear Review: 7
WBC: 4.9 10*3/uL (ref 4.0–10.5)
nRBC: 0 % (ref 0.0–0.2)

## 2018-03-02 LAB — HEMOGLOBIN
Hemoglobin: 5.2 g/dL — CL (ref 12.0–15.0)
Hemoglobin: 4.8 g/dL — CL (ref 12.0–15.0)
Hemoglobin: 4.5 g/dL — CL (ref 12.0–15.0)

## 2018-03-02 LAB — HEPARIN LEVEL (UNFRACTIONATED): Heparin Unfractionated: 0.79 IU/mL — ABNORMAL HIGH (ref 0.30–0.70)

## 2018-03-02 MED ORDER — ACETAMINOPHEN 325 MG PO TABS
650.0000 mg | ORAL_TABLET | Freq: Four times a day (QID) | ORAL | Status: DC
  Administered 2018-03-02 – 2018-03-08 (×16): 650 mg via ORAL
  Filled 2018-03-02 (×18): qty 2

## 2018-03-02 MED ORDER — ENOXAPARIN SODIUM 120 MG/0.8ML ~~LOC~~ SOLN
1.0000 mg/kg | Freq: Two times a day (BID) | SUBCUTANEOUS | Status: DC
  Administered 2018-03-02 – 2018-03-08 (×12): 110 mg via SUBCUTANEOUS
  Filled 2018-03-02 (×14): qty 0.73

## 2018-03-02 NOTE — Progress Notes (Addendum)
ANTICOAGULATION CONSULT NOTE   Pharmacy Consult for Heparin > Lovenox Indication: pulmonary embolus, DVT  Allergies  Allergen Reactions  . Lisinopril-Hydrochlorothiazide Itching  . Adhesive [Tape] Itching and Other (See Comments)    Redness  . Fentanyl Other (See Comments)    GI upset and drowsiness. Only to [facility]  . Gabapentin Other (See Comments)    Auditory hallucinations  . Hctz [Hydrochlorothiazide] Itching  . Latex Itching  . Lisinopril Itching  . Losartan Potassium Other (See Comments)    Makes her feel "bad"   . Other Other (See Comments)    Patient refuses blood for religious reasons (per her notes)  . Oxycodone     GI upset, drowsy  . Tums [Calcium Carbonate Antacid] Hives    FRUIT-FLAVORED ONES    Patient Measurements: IBW 61.6 kg Weight: 242 lb 4.8 oz (109.9 kg) Heparin Dosing Weight:  87kg   Vital Signs: Temp: 98.3 F (36.8 C) (12/08 1141) Temp Source: Oral (12/08 1141) BP: 140/76 (12/08 1141) Pulse Rate: 91 (12/08 1141)  Labs: Recent Labs    02/27/18 1237  02/28/18 0611  02/28/18 1430  02/28/18 2134 03/01/18 0610 03/01/18 2256 03/02/18 0418 03/02/18 0613  HGB 5.8*   < > 5.3*  --  5.5*  --  5.3* 5.4* 5.2*  --  4.9*  HCT 20.9*  --  18.6*  --   --   --   --  18.8*  --   --  17.0*  PLT 71*  --  70*  --   --   --   --  83*  --   --  80*  LABPROT  --   --   --   --  16.1*  --   --   --   --   --   --   INR  --   --   --   --  1.31  --   --   --   --   --   --   HEPARINUNFRC  --    < >  --    < >  --    < > 0.39 0.48  --  0.79*  --   CREATININE 0.71  --   --   --   --   --   --  0.81  0.79  --   --   --    < > = values in this interval not displayed.    Estimated Creatinine Clearance: 96.7 mL/min (by C-G formula based on SCr of 0.79 mg/dL).   Medical History: Past Medical History:  Diagnosis Date  . Anxiety   . Arthritis    hands, knees, hips  . Asthma   . Bronchitis   . Cancer ([place])   . Cervical stenosis (uterine cervix)   .  Disorder of appendix    Enlarged  . Dyspnea on exertion   . GERD (gastroesophageal reflux disease)   . Headache(784.0)   . History of breast cancer    2010--  LEFT  s/p  mastectomy (in [place]) AND CHEMORADIATION--  NO RECURRENCE  . History of cervical dysplasia   . Hyperlipidemia   . Hypertension   . Malignant neoplasm of overlapping sites of left breast in female, estrogen receptor positive ([place]) 03/05/2013  . OSA (obstructive sleep apnea) moderate osa per study  09/2010   CPAP  , NOT USING ON REGULAR BASIS  . Pelvic pain in female   . Positive H. pylori test  08-05-2013  . Positive TB test    AS TEEN--  [place] W/ MEDS  . Refusal of blood transfusions as patient is Jehovah's Witness   . Seasonal allergies   . Uterine fibroid   . Wears glasses     Assessment: CC/HPI: dyspnea, cough and pleuritic pain, R thigh swelling. ER w/u revealed mod sized R PE.  PMH: Jehovah's witness with hx HTN, OSA, obesity, stage IIIa metastatic breast cancer, previous DVT with course c/b bleeding (anticoag was stopped)., anxiety, arthritis, asthma, bronchitis, GERD, cervical dysplasia, HLD, Positive H. pylori test, Positive TB test  Anticoag: Hgb down to 4.9. Plts stable at 80 (her average). H/o DVT previously on lovenox, stopped r/t worsening anemia. Pt was apparently told to restart but stopped on her own due to risk of bleeding. IVC filter placed 12/7. MD discussed with Oncology, and they recommend continued anticoagulation.  Pharmacy consulted to transition to Lovenox. Dr. Verlon Au ok with transition with low Hg of 4.9. Patient still refusing transfusions. No active bleed issues documented. SCr stable WNL.  Goal of Therapy:  Heparin level 0.3-0.5 units/ml Monitor platelets by anticoagulation protocol: Yes   Plan:  D/c heparin infusion > start Lovenox 110mg  (~1mg /kg) [place] q12h within 1hr after heparin drip d/c'd - communicated plan with RN Monitor CBC at least q72h inpatient, s/sx bleeding  Elicia Lamp,  PharmD, BCPS Clinical Pharmacist Clinical phone [phone] Please check AMION for all [place] contact numbers 03/02/2018 12:05 PM

## 2018-03-02 NOTE — Progress Notes (Signed)
[place] for Heparin Indication: pulmonary embolus, DVT  Allergies  Allergen Reactions  . Lisinopril-Hydrochlorothiazide Itching  . Adhesive [Tape] Itching and Other (See Comments)    Redness  . Fentanyl Other (See Comments)    GI upset and drowsiness. Only to [facility]  . Gabapentin Other (See Comments)    Auditory hallucinations  . Hctz [Hydrochlorothiazide] Itching  . Latex Itching  . Lisinopril Itching  . Losartan Potassium Other (See Comments)    Makes her feel "bad"   . Other Other (See Comments)    Patient refuses blood for religious reasons (per her notes)  . Oxycodone     GI upset, drowsy  . Tums [Calcium Carbonate Antacid] Hives    FRUIT-FLAVORED ONES    Patient Measurements: IBW 61.6 kg Weight: 238 lb (108 kg) Heparin Dosing Weight:  87kg   Vital Signs: Temp: 99.5 F (37.5 C) (12/08 0024) Temp Source: Oral (12/08 0024) BP: 123/66 (12/08 0024) Pulse Rate: 93 (12/08 0024)  Labs: Recent Labs    02/27/18 1237  02/28/18 0611  02/28/18 1430  02/28/18 2134 03/01/18 0610 03/01/18 2256 03/02/18 0418  HGB 5.8*   < > 5.3*  --  5.5*  --  5.3* 5.4* 5.2*  --   HCT 20.9*  --  18.6*  --   --   --   --  18.8*  --   --   PLT 71*  --  70*  --   --   --   --  83*  --   --   LABPROT  --   --   --   --  16.1*  --   --   --   --   --   INR  --   --   --   --  1.31  --   --   --   --   --   HEPARINUNFRC  --    < >  --    < >  --    < > 0.39 0.48  --  0.79*  CREATININE 0.71  --   --   --   --   --   --  0.81  0.79  --   --    < > = values in this interval not displayed.    Estimated Creatinine Clearance: 95.9 mL/min (by C-G formula based on SCr of 0.79 mg/dL).   Medical History: Past Medical History:  Diagnosis Date  . Anxiety   . Arthritis    hands, knees, hips  . Asthma   . Bronchitis   . Cancer ([place])   . Cervical stenosis (uterine cervix)   . Disorder of appendix    Enlarged  . Dyspnea on exertion   . GERD  (gastroesophageal reflux disease)   . Headache(784.0)   . History of breast cancer    2010--  LEFT  s/p  mastectomy (in [place]) AND CHEMORADIATION--  NO RECURRENCE  . History of cervical dysplasia   . Hyperlipidemia   . Hypertension   . Malignant neoplasm of overlapping sites of left breast in female, estrogen receptor positive ([place]) 03/05/2013  . OSA (obstructive sleep apnea) moderate osa per study  09/2010   CPAP  , NOT USING ON REGULAR BASIS  . Pelvic pain in female   . Positive H. pylori test    08-05-2013  . Positive TB test    AS TEEN--  [place] W/ MEDS  . Refusal of  blood transfusions as patient is Jehovah's Witness   . Seasonal allergies   . Uterine fibroid   . Wears glasses     Assessment: CC/HPI: dyspnea, cough and pleuritic pain, R thigh swelling. ER w/u revealed mod sized R PE   PMH: Jehovah's witness with hx HTN, OSA, obesity, stage IIIa metastatic breast cancer, previous DVT with course c/b bleeding (anticoag was stopped)., anxiety, arthritis, asthma, bronchitis, GERD, cervical dysplasia, HLD, Positive H. pylori test, Positive TB test  Anticoag: Hgb 5.8. Plts 71 (her average). H/o DVT previously on lovenox, stopped r/t worsening anemia.  Pt was apparently told to restart but stopped on her own due to risk of bleeding.   12/8 AM update: heparin level supra-therapeutic this AM, no issues per RN.   Goal of Therapy:  Heparin level 0.3-0.5 units/ml Monitor platelets by anticoagulation protocol: Yes   Plan:  Dec heparin to 1150 units/hr Re-check heparin level at [place], PharmD, [place] Pharmacist Phone: [phone]

## 2018-03-02 NOTE — Progress Notes (Signed)
[facility] PROGRESS NOTE  EDAN SERRATORE KGM:[id number] DOB: [birth year] DOA: 02/27/2018 PCP: Clent Demark, PA-C  Narrative:  57 year old female Known history of left breast cancer stage IIIa status post left mastectomy/XRT/tamoxifen with bony mets-intent of care of her treatment for cancer is palliative Jehovah's Witness refusing blood transfusion usual hemoglobins in the 4-5 range-takes Aranesp injections 500 q. 2 weeks COPD Right lower extremity DVT discovered at [facility] not on anticoagulation supposed to be on Lovenox which she stopped taking on her own because of GI bleed  Recent hospitalization 10/9-10/12 for symptomatic anemia with hemoglobin in the 3 range  A & Plan New pulmonary embolism in setting Hypercoagulable state from cancer-continue IV heparin-had filter placed without issue-continue Lovenox Filter wouldn't really prevent decrease of burden of PE without AC  Fever-source unclear and tmax 99 now-observe--nitiate septic work-up if spikes again-chest clear no dysuria  R upper arm swelling-could be from infiltrated IV-follow clinically  Headache arm numbness-no focal decits-scheduled tylenol-monitor and if worse would scan head  Left breast cancer status post mastectomy S RT on tamoxifen intent of care palliative not currently on chemotherapy-OP follow up with Oncologist  Severe anemia with Jehovah's Witness complicating-Aranesp every 2 weeks-recheck labs in the am-continue Nulecit 125 as prn  COPD Goals of care-very gaurded prognosis-needs further discussion with Oncologist and pallaitve care  DVT prophylaxis: on IV heparin  Code Status: full cod3   Family Communication: daughter bedside   Disposition Plan: inpatient- can d/c in am if all stable and decisions abotu AC decided   Verlon Au, MD  [facility] Direct contact: [phone] --Via amion app OR  --[email or web address]; password [email or web address]  7PM-7AM contact night coverage as above 03/02/2018, 1:05 PM   LOS: 3 days   Consultants:  CCM  Oncologist  Procedures:  n  Antimicrobials:  n  Interval history/Subjective:  Headache, arm pain and numbness to face Had fever last pm No cp No dysuria No cough  Objective:  Vitals:  Vitals:   03/02/18 1018 03/02/18 1141  BP: 127/68 140/76  Pulse:  91  Resp:  16  Temp:  98.3 F (36.8 C)  SpO2:  100%    Exam:  . eomi ncat no ict no pallor . cta b no added sound-no rale snor rhonchi . s1 s 2 slight tachy . Abd soft nt nd no rebound no guard . Neuro intact-power 5/5, smile symm, sensory intact-refelxes intact . Affect flat   I have personally reviewed the following:   Labs:  Hemoglobin 5.3-5.4->4.9, platelet 83  LFTs normal  Scheduled Meds: . acetaminophen  650 mg Oral Q6H  . enoxaparin (LOVENOX) injection  1 mg/kg Subcutaneous Q12H  . fluticasone furoate-vilanterol  1 puff Inhalation Daily  . folic acid  1 mg Oral Daily  . Influenza vac split quadrivalent PF  0.5 mL Intramuscular Tomorrow-1000  . loratadine  10 mg Oral Daily  . montelukast  10 mg Oral QHS  . pantoprazole  40 mg Oral Daily  . sodium chloride flush  3 mL Intravenous Q12H  . cyanocobalamin  1,000 mcg Oral Daily   Continuous Infusions: . sodium chloride 250 mL (02/28/18 0050)    Active Problems:   Pulmonary embolism (HCC)   Metastatic breast cancer ([place])   LOS: 3 days

## 2018-03-02 NOTE — Progress Notes (Signed)
  Echocardiogram 2D Echocardiogram has been performed.  Michaela Little 03/02/2018, 8:47 AM

## 2018-03-03 ENCOUNTER — Other Ambulatory Visit: Payer: Medicare Other

## 2018-03-03 ENCOUNTER — Ambulatory Visit: Payer: Medicare Other

## 2018-03-03 ENCOUNTER — Inpatient Hospital Stay (HOSPITAL_COMMUNITY): Payer: Medicare Other

## 2018-03-03 LAB — RENAL FUNCTION PANEL
Albumin: 3.1 g/dL — ABNORMAL LOW (ref 3.5–5.0)
Anion gap: 12 (ref 5–15)
BUN: 11 mg/dL (ref 6–20)
CO2: 22 mmol/L (ref 22–32)
CREATININE: 0.78 mg/dL (ref 0.44–1.00)
Calcium: 9.8 mg/dL (ref 8.9–10.3)
Chloride: 103 mmol/L (ref 98–111)
GFR calc Af Amer: >60 mL/min (ref >60)
GFR calc non Af Amer: >60 mL/min (ref >60)
Glucose, Bld: 118 mg/dL — ABNORMAL HIGH (ref 70–99)
Phosphorus: 4.9 mg/dL — ABNORMAL HIGH (ref 2.5–4.6)
Potassium: 3.9 mmol/L (ref 3.5–5.1)
Sodium: 137 mmol/L (ref 135–145)

## 2018-03-03 LAB — CBC
HCT: 15.7 % — ABNORMAL LOW (ref 36.0–46.0)
Hemoglobin: 4.5 g/dL — CL (ref 12.0–15.0)
MCH: 27.6 pg (ref 26.0–34.0)
MCHC: 28.7 g/dL — ABNORMAL LOW (ref 30.0–36.0)
MCV: 96.3 fL (ref 80.0–100.0)
Platelets: 75 10*3/uL — ABNORMAL LOW (ref 150–400)
RBC: 1.63 MIL/uL — AB (ref 3.87–5.11)
RDW: 19.4 % — ABNORMAL HIGH (ref 11.5–15.5)
WBC: 3.8 10*3/uL — ABNORMAL LOW (ref 4.0–10.5)
nRBC: 14.8 % — ABNORMAL HIGH (ref 0.0–0.2)

## 2018-03-03 LAB — PATHOLOGIST SMEAR REVIEW

## 2018-03-03 LAB — HEMOGLOBIN: Hemoglobin: 4.5 g/dL — CL (ref 12.0–15.0)

## 2018-03-03 MED ORDER — TRAMADOL HCL 50 MG PO TABS
100.0000 mg | ORAL_TABLET | Freq: Four times a day (QID) | ORAL | Status: DC
  Administered 2018-03-03: 100 mg via ORAL
  Filled 2018-03-03: qty 2

## 2018-03-03 MED ORDER — POLYETHYLENE GLYCOL 3350 17 G PO PACK
17.0000 g | PACK | Freq: Every day | ORAL | Status: DC
  Administered 2018-03-03 – 2018-03-05 (×3): 17 g via ORAL
  Filled 2018-03-03 (×6): qty 1

## 2018-03-03 MED ORDER — TRAMADOL HCL 50 MG PO TABS
50.0000 mg | ORAL_TABLET | Freq: Four times a day (QID) | ORAL | Status: DC
  Administered 2018-03-03: 50 mg via ORAL
  Filled 2018-03-03: qty 1

## 2018-03-03 NOTE — Plan of Care (Signed)

## 2018-03-03 NOTE — Progress Notes (Signed)
[facility] PROGRESS NOTE  Michaela Little WEX:[id number] DOB: Oct 16, 1960 DOA: 02/27/2018 PCP: Clent Demark, PA-C  Narrative:  57 year old female Known history of left breast cancer stage IIIa status post left mastectomy/XRT/tamoxifen with bony mets-intent of care of her treatment for cancer is palliative Jehovah's Witness refusing blood transfusion usual hemoglobins in the 4-5 range-takes Aranesp injections 500 q. 2 weeks COPD Right lower extremity DVT discovered at [facility] not on anticoagulation supposed to be on Lovenox which she stopped taking on her own because of GI bleed  Recent hospitalization 10/9-10/12 for symptomatic anemia with hemoglobin in the 3 range  A & Plan New pulmonary embolism in setting Hypercoagulable state from cancer-continue IV heparin-had filter placed without issue-continue Lovenox Filter wouldn't really prevent decrease of burden of PE without AC  Fever-source unclear and tmax 99 now-further concerns for fever at this time-monitor  Right hip pain-she is also having pain groin to right hip with inability to bear weight on that area-I have asked therapy to see her and if she is not better on tramadol we will get an x-ray of the hip to rule out any occult fracture although she could probably have a burst  R upper arm swelling-could be from infiltrated IV-follow clinically-clinically has improved with no recurrence and is smaller in size and prior  Headache arm numbness-no focal decits-scheduled tylenol-no further recurrence of numbness  Left breast cancer status post mastectomy S RT on tamoxifen intent of care palliative not currently on chemotherapy-OP follow up with Oncologist  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-Aranesp every 2 weeks-recheck labs in the am-continue Nulecit 125 as prn  COPD Goals of care-very gaurded prognosis-needs further discussion with Oncologist and pallaitve care  DVT prophylaxis: on  IV heparin  Code Status: full cod3   Family Communication: daughter bedside   Disposition Plan: Await therapy evaluation   Kingslee Mairena, MD  [facility] Direct contact: [phone] --Via amion app OR  --[email or web address]; password [email or web address]  7PM-7AM contact night coverage as above 03/03/2018, 11:06 AM  LOS: 4 days   Consultants:  CCM  Oncologist  Procedures:  n  Antimicrobials:  n  Interval history/Subjective:  Right hip pain, inability to has not really been out of bed since admission Eating drinking fair no chills No nausea No headache  Objective:  Vitals:  Vitals:   03/03/18 0818 03/03/18 0841  BP: 128/72   Pulse: 96   Resp: 18   Temp: 98.6 F (37 C)   SpO2: 100% 99%    Exam:  . eomi ncat no ict no pallor . cta b no added sound-chest is clear . s1 s 2 slight tachy . Abd soft nt nd no rebound no guard . Neuro intact-power 5/5, smile symm, sensory intact-refelxes intact . Range of motion is intact to right hip and tender in the buttocks and lateral aspect of the thigh superiorly in the hip . Affect flat   I have personally reviewed the following:   Labs:  Hemoglobin 5.3-5.4->4.9-->4.5, platelet 83-->75  bmet wnl  Scheduled Meds: . acetaminophen  650 mg Oral Q6H  . enoxaparin (LOVENOX) injection  1 mg/kg Subcutaneous Q12H  . fluticasone furoate-vilanterol  1 puff Inhalation Daily  . folic acid  1 mg Oral Daily  . Influenza vac split quadrivalent PF  0.5 mL Intramuscular Tomorrow-1000  . montelukast  10 mg Oral QHS  . pantoprazole  40 mg Oral Daily  . polyethylene glycol  17 g Oral Daily  . sodium chloride flush  3 mL Intravenous Q12H  . traMADol  50 mg Oral Q6H  . cyanocobalamin  1,000 mcg Oral Daily   Continuous Infusions: . sodium chloride Stopped (02/28/18 0321)    Active Problems:   Pulmonary embolism (HCC)   Metastatic breast cancer (HCC)   LOS: 4 days

## 2018-03-03 NOTE — Care Management Important Message (Signed)
Important Message  Patient Details  Name: Michaela Little MRN: [id number] Date of Birth: 08/13/1960   Medicare Important Message Given:  Yes    Camp Gopal P Parissa Chiao 03/03/2018, 12:58 PM

## 2018-03-04 ENCOUNTER — Inpatient Hospital Stay (INDEPENDENT_AMBULATORY_CARE_PROVIDER_SITE_OTHER): Payer: Medicare Other | Admitting: Physician Assistant

## 2018-03-04 LAB — CBC
HCT: 15.4 % — ABNORMAL LOW (ref 36.0–46.0)
Hemoglobin: 4.5 g/dL — CL (ref 12.0–15.0)
MCH: 27.8 pg (ref 26.0–34.0)
MCHC: 29.2 g/dL — ABNORMAL LOW (ref 30.0–36.0)
MCV: 95.1 fL (ref 80.0–100.0)
PLATELETS: 70 10*3/uL — AB (ref 150–400)
RBC: 1.62 MIL/uL — ABNORMAL LOW (ref 3.87–5.11)
RDW: 19.8 % — ABNORMAL HIGH (ref 11.5–15.5)
WBC: 4.6 10*3/uL (ref 4.0–10.5)
nRBC: 13 % — ABNORMAL HIGH (ref 0.0–0.2)

## 2018-03-04 NOTE — Consult Note (Signed)
Reason for Consult:Right hip pain Referring Physician: Cornelia Little is an 57 y.o. female.  HPI: Michaela Little was admitted 12/5 with a PE. She has a complicated medical history of breast ca with bony mets being treated for palliation, DVT/PE with varying success with anticoagulation 2/2 GIB, chronic anemia not amenable to treatment 2/2 JW, and COPD/HTN/HLD/OSA/GERD. Orthopedic surgery was consulted for right hip pain of several days duration. She notes severe pain with most movement and with attempted weightbearing. She point to her groin as the main location of her pain but says that her outer thigh and buttock are involved as well at varying times. She has had problems with that hip in the past but never this bad. She also notes exacerbation of the pain with coughing/sneezing. She admits to vomiting if her coughing is severe but denies any intermittent nausea. She denies fever, chills, sweats. She has no pain unless she is moving.  Past Medical History:  Diagnosis Date  . Anxiety   . Arthritis    hands, knees, hips  . Asthma   . Bronchitis   . Cancer ([place])   . Cervical stenosis (uterine cervix)   . Disorder of appendix    Enlarged  . Dyspnea on exertion   . GERD (gastroesophageal reflux disease)   . Headache(784.0)   . History of breast cancer    2010--  LEFT  s/p  mastectomy (in [place]) AND CHEMORADIATION--  NO RECURRENCE  . History of cervical dysplasia   . Hyperlipidemia   . Hypertension   . Malignant neoplasm of overlapping sites of left breast in female, estrogen receptor positive ([place]) 03/05/2013  . OSA (obstructive sleep apnea) moderate osa per study  09/2010   CPAP  , NOT USING ON REGULAR BASIS  . Pelvic pain in female   . Positive H. pylori test    08-05-2013  . Positive TB test    AS TEEN--  [place] W/ MEDS  . Refusal of blood transfusions as patient is Jehovah's Witness   . Seasonal allergies   . Uterine fibroid   . Wears glasses     Past Surgical History:   Procedure Laterality Date  . APPENDECTOMY  y  . CARDIOVASCULAR STRESS TEST  10-29-2012   low risk perfusion study/  no significant reversibity/ ef 66%/  normal wall motion  . CERVICAL CONIZATION W/BX  2012   in  [place] N/A 05/14/2012   Procedure: LAPAROSCOPIC CHOLECYSTECTOMY;  Surgeon: Ralene Ok, MD;  Location: [facility];  Service: General;  Laterality: N/A;  . COLONOSCOPY  2011   normal per patient - [place]  . DILATION AND CURETTAGE OF UTERUS  10/15/2011   Procedure: DILATATION AND CURETTAGE;  Surgeon: Melina Schools, MD;  Location: [facility];  Service: Gynecology;  Laterality: N/A;  Conization &  endocervical curettings  . EXAMINATION UNDER ANESTHESIA N/A 08/20/2013   Procedure: EXAM UNDER ANESTHESIA;  Surgeon: Margarette Asal, MD;  Location: [facility];  Service: Gynecology;  Laterality: N/A;  . [place]   right  . IR IVC FILTER PLMT / S&I /IMG GUID/MOD SED  03/01/2018  . LAPAROSCOPIC APPENDECTOMY N/A 08/22/2017   Procedure: [place];  Surgeon: Coralie Keens, MD;  Location: [facility];  Service: General;  Laterality: N/A;  . LAPAROSCOPIC ASSISTED VAGINAL HYSTERECTOMY N/A 10/26/2014   Procedure: HYSTERECTOMY ABDOMINAL ;  Surgeon: Molli Posey, MD;  Location: [facility];  Service: Gynecology;  Laterality: N/A;  . MASTECTOMY  Left 11/2008  in [place]   . SALPINGOOPHORECTOMY Bilateral 10/26/2014   Procedure: BILATERAL SALPINGO OOPHORECTOMY;  Surgeon: Molli Posey, MD;  Location: [facility];  Service: Gynecology;  Laterality: Bilateral;  . TISSUE EXPANDER PLACEMENT Left 08/22/2017   Procedure: REMOVAL OF LEFT TISSUE EXPANDER;  Surgeon: Crissie Reese, MD;  Location: [facility];  Service: Plastics;  Laterality: Left;  . TISSUE EXPANDER REMOVAL Left 08/22/2017  . TRANSTHORACIC ECHOCARDIOGRAM  10-29-2012   mild lvh/  ef 60-65%/  grade II diastolic dysfunction/  trivial mr  &  tr    Family History  Problem Relation Age of  Onset  . Breast cancer Mother   . Colon cancer Mother   . Hypotension Mother   . Asthma Mother   . Diabetes type II Mother   . Arthritis Mother   . Clotting disorder Mother   . Cancer Mother        breast/colon  . Mental illness Brother   . Heart disease Brother   . Cerebral palsy Daughter   . Emphysema Brother        never smoker  . Colon cancer Maternal Aunt   . Cancer Maternal Aunt        colon  . Colon cancer Maternal Uncle   . Cancer Maternal Uncle        colon  . Esophageal cancer Neg Hx   . Rectal cancer Neg Hx   . Stomach cancer Neg Hx   . Thyroid disease Neg Hx     Social History:  reports that she quit smoking about 29 years ago. Her smoking use included cigarettes. She has a 3.00 pack-year smoking history. She has never used smokeless tobacco. She reports that she does not drink alcohol or use drugs.  Allergies:  Allergies  Allergen Reactions  . Lisinopril-Hydrochlorothiazide Itching  . Adhesive [Tape] Itching and Other (See Comments)    Redness  . Fentanyl Other (See Comments)    GI upset and drowsiness. Only to [facility]  . Gabapentin Other (See Comments)    Auditory hallucinations  . Hctz [Hydrochlorothiazide] Itching  . Latex Itching  . Lisinopril Itching  . Losartan Potassium Other (See Comments)    Makes her feel "bad"   . Other Other (See Comments)    Patient refuses blood for religious reasons (per her notes)  . Oxycodone     GI upset, drowsy  . Tums [Calcium Carbonate Antacid] Hives    FRUIT-FLAVORED ONES    Medications: I have reviewed the patient's current medications.  Results for orders placed or performed during the hospital encounter of 02/27/18 (from the past 48 hour(s))  Hemoglobin     Status: Abnormal   Collection Time: 03/02/18  9:47 PM  Result Value Ref Range   Hemoglobin 4.5 (LL) 12.0 - 15.0 g/dL    Comment: REPEATED TO VERIFY CRITICAL VALUE NOTED.  VALUE IS CONSISTENT WITH PREVIOUSLY REPORTED AND CALLED  VALUE. Performed at [facility], [place] [address] [place], [place] [address]   CBC     Status: Abnormal   Collection Time: 03/03/18  6:02 AM  Result Value Ref Range   WBC 3.8 (L) 4.0 - 10.5 K/uL   RBC 1.63 (L) 3.87 - 5.11 MIL/uL   Hemoglobin 4.5 (LL) 12.0 - 15.0 g/dL    Comment: REPEATED TO VERIFY CRITICAL VALUE NOTED.  VALUE IS CONSISTENT WITH PREVIOUSLY REPORTED AND CALLED VALUE.    HCT 15.7 (L) 36.0 - 46.0 %  MCV 96.3 80.0 - 100.0 fL   MCH 27.6 26.0 - 34.0 pg   MCHC 28.7 (L) 30.0 - 36.0 g/dL   RDW 19.4 (H) 11.5 - 15.5 %   Platelets 75 (L) 150 - 400 K/uL    Comment: REPEATED TO VERIFY Immature Platelet Fraction may be clinically indicated, consider ordering this additional test [id number] CONSISTENT WITH PREVIOUS RESULT    nRBC 14.8 (H) 0.0 - 0.2 %    Comment: Performed at [facility] [address] [place], [place] [address]  Renal function panel     Status: Abnormal   Collection Time: 03/03/18  6:02 AM  Result Value Ref Range   Sodium 137 135 - 145 mmol/L   Potassium 3.9 3.5 - 5.1 mmol/L   Chloride 103 98 - 111 mmol/L   CO2 22 22 - 32 mmol/L   Glucose, Bld 118 (H) 70 - 99 mg/dL   BUN 11 6 - 20 mg/dL   Creatinine, Ser 0.78 0.44 - 1.00 mg/dL   Calcium 9.8 8.9 - 10.3 mg/dL   Phosphorus 4.9 (H) 2.5 - 4.6 mg/dL   Albumin 3.1 (L) 3.5 - 5.0 g/dL   GFR calc non Af Amer >60 >60 mL/min   GFR calc Af Amer >60 >60 mL/min   Anion gap 12 5 - 15    Comment: Performed at [facility] [address] [place], [place] [address]  Hemoglobin     Status: Abnormal   Collection Time: 03/03/18  2:10 PM  Result Value Ref Range   Hemoglobin 4.5 (LL) 12.0 - 15.0 g/dL    Comment: REPEATED TO VERIFY CRITICAL VALUE NOTED.  VALUE IS CONSISTENT WITH PREVIOUSLY REPORTED AND CALLED VALUE. Performed at [facility], [place] [address] [place], [place] [address]   CBC     Status: Abnormal   Collection Time: 03/04/18  4:19 AM  Result Value Ref Range   WBC 4.6 4.0 - 10.5  K/uL   RBC 1.62 (L) 3.87 - 5.11 MIL/uL   Hemoglobin 4.5 (LL) 12.0 - 15.0 g/dL    Comment: REPEATED TO VERIFY CRITICAL VALUE NOTED.  VALUE IS CONSISTENT WITH PREVIOUSLY REPORTED AND CALLED VALUE.    HCT 15.4 (L) 36.0 - 46.0 %   MCV 95.1 80.0 - 100.0 fL   MCH 27.8 26.0 - 34.0 pg   MCHC 29.2 (L) 30.0 - 36.0 g/dL   RDW 19.8 (H) 11.5 - 15.5 %   Platelets 70 (L) 150 - 400 K/uL    Comment: REPEATED TO VERIFY Immature Platelet Fraction may be clinically indicated, consider ordering this additional test [id number] CONSISTENT WITH PREVIOUS RESULT    nRBC 13.0 (H) 0.0 - 0.2 %    Comment: Performed at [facility], [place] [address], [place], [place] [address]    Dg Hip Unilat With Pelvis 2-3 Views Right  Result Date: 03/03/2018 CLINICAL DATA:  Chronic right hip pain EXAM: DG HIP (WITH OR WITHOUT PELVIS) 2-3V RIGHT COMPARISON:  04/17/2015 FINDINGS: SI joints are non widened. Pubic symphysis and rami are intact. No fracture or malalignment. Mild to moderate degenerative change of the right hip. 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 No acute osseous abnormality. Electronically Signed   By: Donavan Foil M.D.   On: 03/03/2018 21:00    Review of Systems  Constitutional: Negative for chills, fever and weight loss.  HENT: Negative for ear discharge, ear pain, hearing loss and tinnitus.   Eyes: Negative for blurred vision,  double vision, photophobia and pain.  Respiratory: Negative for cough, sputum production and shortness of breath.   Cardiovascular: Negative for chest pain.  Gastrointestinal: Positive for vomiting (When coughing). Negative for abdominal pain and nausea.  Genitourinary: Negative for dysuria, flank pain, frequency and urgency.  Musculoskeletal: Positive for back pain and joint pain (Right hip). Negative for falls, myalgias and neck pain.  Neurological: Negative for dizziness, tingling, sensory change, focal weakness, loss of consciousness and headaches.   Endo/Heme/Allergies: Does not bruise/bleed easily.  Psychiatric/Behavioral: Negative for depression, memory loss and substance abuse. The patient is not nervous/anxious.    Blood pressure (!) 161/79, pulse (!) 101, temperature 98.6 F (37 C), temperature source Oral, resp. rate 20, weight 112.3 kg, last menstrual period 02/25/2009, SpO2 98 %. Physical Exam  Constitutional: She appears well-developed and well-nourished. No distress.  HENT:  Head: Normocephalic and atraumatic.  Eyes: Conjunctivae are normal. Right eye exhibits no discharge. Left eye exhibits no discharge. No scleral icterus.  Neck: Normal range of motion.  Cardiovascular: Normal rate and regular rhythm.  Respiratory: Effort normal. No respiratory distress.  Musculoskeletal:  RLE No traumatic wounds, ecchymosis, or rash  Minimally TTP lateral hip, no pain with PROM, severe pain with AROM  No knee or ankle effusion  Knee stable to varus/ valgus and anterior/posterior stress  Sens DPN, SPN, TN intact  Motor EHL, ext, flex, evers 5/5  DP 2+, PT 2+, No significant edema  Neurological: She is alert.  Skin: Skin is warm and dry. She is not diaphoretic.  Psychiatric: She has a normal mood and affect. Her behavior is normal.    Assessment/Plan: Right hip pain -- Differential is broad and could be multifactorial. I worry about intraabdominal pathology given her Valsalva exacerbation, metastatic disease, AVN, neuropathy, etc. Will check MRI pelvis/hip to start. Dr. Stann Mainland to evaluate later tonight as well.    Lisette Abu, PA-C Orthopedic Surgery [phone] 03/04/2018, 1:47 PM

## 2018-03-04 NOTE — Progress Notes (Signed)
Discharge Planning-if dc home on Lovenox, will need prior approval.   14 SYRINGES 3. LOVENOX 120 MG  SQ EVERY 12 HRS X  1 WEEK   COVER- NOT COVER PRIOR APPROVAL- YES # [phone]  3. ENOXAPARIN 120 MG  A  SQ EVERY 12 HRS X 1 WEEK COVER- YES CO-PAY-  Q/L  TWO SYRINGES PER DAY -OVER TIER- 4 DRUG PRIOR APPROVAL- YES [phone]  Message left for attending. Jonnie Finner RN CCM Case Mgmt phone [phone]

## 2018-03-04 NOTE — Evaluation (Signed)
Physical Therapy Evaluation Patient Details Name: Michaela Little MRN: [id number] DOB: 1960-09-06 Today's Date: 03/04/2018   History of Present Illness  57 yo female with onset of pulm embolism after recent history of breast CA with bony mets was admitted for anticoagulation.  Has low hgb and due to beliefs is not going to transfuse.  Has R hip OA with severe pain, RLE DVT recently and cannot tolerate WB on RLE.  PMHx:  Breast CA, anemia, asthma, OA, HA, spinal stenosis, OSA, HTN  Clinical Impression  Pt is progressing from beginning of session to manage her pain with nursing intervention.  Pt tolerates full strength LE resistance and has RW for standing balance.  Her ability to stand and take steps are only limited by pain so recommend MD continue to work with her on this.  Pt has no signs of bursitis, has ability to control strength on LE's and can walk x where pain inhibits her.  Follow acutely for this loss of functional standing tolerance.    Follow Up Recommendations Supervision - Intermittent;Home health PT    Equipment Recommendations  None recommended by PT    Recommendations for Other Services       Precautions / Restrictions Precautions Precautions: Fall Precaution Comments: telemetry Restrictions Weight Bearing Restrictions: No      Mobility  Bed Mobility Overal bed mobility: Needs Assistance Bed Mobility: Supine to Sit;Sit to Supine     Supine to sit: Min assist Sit to supine: Mod assist   General bed mobility comments: mod to lift legs after sitting and walking  Transfers Overall transfer level: Needs assistance Equipment used: Rolling walker (2 wheeled);1 person hand held assist Transfers: Sit to/from Stand Sit to Stand: Min assist;Min guard         General transfer comment: reminders of hand placemetn  Ambulation/Gait Ambulation/Gait assistance: Min guard Gait Distance (Feet): 2 Feet Assistive device: Rolling walker (2 wheeled);1 person hand held  assist Gait Pattern/deviations: Step-to pattern;Decreased stride length;Wide base of support Gait velocity: reduced Gait velocity interpretation: <1.8 ft/sec, indicate of risk for recurrent falls    Stairs            Wheelchair Mobility    Modified Rankin (Stroke Patients Only)       Balance Overall balance assessment: Needs assistance Sitting-balance support: Feet supported Sitting balance-Leahy Scale: Good   Postural control: Right lateral lean Standing balance support: Bilateral upper extremity supported;During functional activity Standing balance-Leahy Scale: Fair                               Pertinent Vitals/Pain Pain Assessment: 0-10 Pain Score: 10-Worst pain ever Pain Location: R hip at posterior superior iliac spine Pain Descriptors / Indicators: Sharp;Pressure Pain Intervention(s): Limited activity within patient's tolerance;Monitored during session;Premedicated before session;Repositioned    Home Living Family/patient expects to be discharged to:: Private residence Living Arrangements: Other relatives Available Help at Discharge: Family;Available 24 hours/day Type of Home: House Home Access: Level entry     Home Layout: One level Home Equipment: Walker - 2 wheels;Walker - 4 wheels;Wheelchair - manual      Prior Function Level of Independence: Independent with assistive device(s)               Hand Dominance   Dominant Hand: Right    Extremity/Trunk Assessment   Upper Extremity Assessment Upper Extremity Assessment: Overall WFL for tasks assessed    Lower Extremity Assessment Lower Extremity Assessment:  Overall [facility] for tasks assessed    Cervical / Trunk Assessment Cervical / Trunk Assessment: Normal  Communication   Communication: No difficulties  Cognition Arousal/Alertness: Awake/alert Behavior During Therapy: WFL for tasks assessed/performed Overall Cognitive Status: Within Functional Limits for tasks assessed                                         General Comments General comments (skin integrity, edema, etc.): strength testing BLE's    Exercises General Exercises - Lower Extremity Ankle Circles/Pumps: AROM Heel Slides: Strengthening;10 reps Hip ABduction/ADduction: AROM;Both;10 reps   Assessment/Plan    PT Assessment Patient needs continued PT services  PT Problem List Decreased strength;Decreased range of motion;Decreased activity tolerance;Decreased balance;Decreased mobility;Decreased coordination;Decreased knowledge of use of DME;Decreased cognition;Decreased safety awareness;Decreased knowledge of precautions;Cardiopulmonary status limiting activity;Pain       PT Treatment Interventions DME instruction;Stair training;Gait training;Functional mobility training;Therapeutic exercise;Therapeutic activities;Balance training;Neuromuscular re-education;Cognitive remediation    PT Goals (Current goals can be found in the Care Plan section)  Acute Rehab PT Goals Patient Stated Goal: to walk and get pain managed PT Goal Formulation: With patient/family Time For Goal Achievement: 03/18/18 Potential to Achieve Goals: Good    Frequency Min 3X/week   Barriers to discharge   home with level envrionment and family help    Co-evaluation               AM-PAC PT "6 Clicks" Mobility  Outcome Measure Help needed turning from your back to your side while in a flat bed without using bedrails?: None Help needed moving from lying on your back to sitting on the side of a flat bed without using bedrails?: A Little Help needed moving to and from a bed to a chair (including a wheelchair)?: A Little Help needed standing up from a chair using your arms (e.g., wheelchair or bedside chair)?: A Little Help needed to walk in hospital room?: A Little Help needed climbing 3-5 steps with a railing? : A Lot 6 Click Score: 18    End of Session Equipment Utilized During Treatment: Gait  belt Activity Tolerance: Patient tolerated treatment well;Patient limited by fatigue Patient left: in bed;with call bell/phone within reach;with bed alarm set;with nursing/sitter in room Nurse Communication: Mobility status PT Visit Diagnosis: Unsteadiness on feet (R26.81);Muscle weakness (generalized) (M62.81);Adult, failure to thrive (R62.7)    Time: 0867-6195 PT Time Calculation (min) (ACUTE ONLY): 29 min   Charges:   PT Evaluation $PT Eval Moderate Complexity: 1 Mod PT Treatments $Therapeutic Activity: 8-22 mins       Ramond Dial 03/04/2018, 4:47 PM   Mee Hives, PT MS Acute Rehab Dept. Number: [place] [facility] [place]

## 2018-03-04 NOTE — Progress Notes (Signed)
[facility] PROGRESS NOTE  Michaela Little XTG:[id number] DOB: 07/19/1960 DOA: 02/27/2018 PCP: Clent Demark, PA-C  Narrative:  57 year old female Known history of left breast cancer stage IIIa status post left mastectomy/XRT/tamoxifen with bony mets-intent of care of her treatment for cancer is palliative [employer] refusing blood transfusion usual hemoglobins in the 4-5 range-takes Aranesp injections 500 q. 2 weeks COPD Right lower extremity DVT discovered at [facility] not on anticoagulation supposed to be on Lovenox which she stopped taking on her own because of GI bleed  Recent hospitalization 10/9-10/12 for symptomatic anemia with hemoglobin in the 3 range  A & Plan New pulmonary embolism in setting Hypercoagulable state from cancer-continue IV heparin-had filter placed without issue-continue Lovenox Filter wouldn't really prevent decrease of burden of PE without AC  Fever-source unclear and tmax 99 now-further concerns for fever at this time-monitor  Right hip pain, ?bursisits of R hip-she is also having pain groin to right hip with inability to bear weight on that area-therapy requested to come back and visit-Asked ortho to see No contraindication for oral steroids if feel this would help  R upper arm swelling- from infiltrated IV-follow clinically-clinically has improved with no recurrence and is smaller in size and prior  Headache arm numbness-no focal decits-scheduled tylenol-no further recurrence of numbness  Left breast cancer status post mastectomy S RT on tamoxifen intent of care palliative not currently on chemotherapy-OP follow up with Oncologist  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-Aranesp every 2 weeks-recheck labs in the am-continue Nulecit 125 as prn  COPD Goals of care-very gaurded prognosis-needs further discussion with Oncologist and pallaitve care  DVT prophylaxis: on IV heparin  Code Status: full cod3    Family Communication: daughter bedside   Disposition Plan: Await therapy evaluation   Michaela Birmingham, MD  [facility] Direct contact: [phone] --Via amion app OR  --[email or web address]; password [email or web address]  7PM-7AM contact night coverage as above 03/04/2018, 1:07 PM  LOS: 5 days   Consultants:  CCM  Oncologist  Procedures:  n  Antimicrobials:  n  Interval history/Subjective:  Right hip pain continues-patient frustrated and in pain No fever no chills  Objective:  Vitals:  Vitals:   03/04/18 0836 03/04/18 1242  BP:  (!) 161/79  Pulse:  (!) 101  Resp:    Temp:  98.6 F (37 C)  SpO2: 99% 98%    Exam:  . eomi ncat no ict no pallor . cta b no added sound-chest is clear . s1 s 2 slight tachy . Abd soft nt nd no rebound no guard . Neuro intact-power 5/5, smile symm, sensory intact-refelxes intact . Range of motion is intact to right hip and tender in the buttocks and lateral aspect of the thigh superiorly in the hip--she has passive SLR test and can internally and externally rotate the hip . Affect flat   I have personally reviewed the following:   Labs:  Hemoglobin 5.3-5.4->4.9-->4.5,   platelet 83-->75-->70  bmet wnl  Scheduled Meds: . acetaminophen  650 mg Oral Q6H  . enoxaparin (LOVENOX) injection  1 mg/kg Subcutaneous Q12H  . fluticasone furoate-vilanterol  1 puff Inhalation Daily  . folic acid  1 mg Oral Daily  . Influenza vac split quadrivalent PF  0.5 mL Intramuscular Tomorrow-1000  . montelukast  10 mg Oral QHS  . pantoprazole  40 mg Oral Daily  . polyethylene glycol  17 g Oral Daily  . sodium chloride flush  3 mL Intravenous Q12H  . traMADol  100 mg Oral Q6H  . cyanocobalamin  1,000 mcg Oral Daily   Continuous Infusions: . sodium chloride Stopped (02/28/18 0321)    Active Problems:   Pulmonary embolism (HCC)   Metastatic breast cancer (HCC)   LOS: 5 days

## 2018-03-04 NOTE — Progress Notes (Signed)
PT Cancellation Note  Patient Details Name: Michaela Little MRN: [id number] DOB: 10-08-1960   Cancelled Treatment:    Reason Eval/Treat Not Completed: Medical issues which prohibited therapy.  Hgb 4.5 and will ck later to see how pt is progressing.   Ramond Dial 03/04/2018, 9:00 AM   Mee Hives, PT MS Acute Rehab Dept. Number: [place] [facility] [place]

## 2018-03-05 ENCOUNTER — Inpatient Hospital Stay (HOSPITAL_COMMUNITY): Payer: Medicare Other

## 2018-03-05 DIAGNOSIS — I824Y1 Acute embolism and thrombosis of unspecified deep veins of right proximal lower extremity: Secondary | ICD-10-CM

## 2018-03-05 LAB — CBC
HCT: 15.2 % — ABNORMAL LOW (ref 36.0–46.0)
Hemoglobin: 4.4 g/dL — CL (ref 12.0–15.0)
MCH: 28.4 pg (ref 26.0–34.0)
MCHC: 28.9 g/dL — ABNORMAL LOW (ref 30.0–36.0)
MCV: 98.1 fL (ref 80.0–100.0)
Platelets: 71 10*3/uL — ABNORMAL LOW (ref 150–400)
RBC: 1.55 MIL/uL — AB (ref 3.87–5.11)
RDW: 19.6 % — ABNORMAL HIGH (ref 11.5–15.5)
WBC: 4.1 10*3/uL (ref 4.0–10.5)
nRBC: 13.6 % — ABNORMAL HIGH (ref 0.0–0.2)

## 2018-03-05 LAB — BASIC METABOLIC PANEL WITH GFR
Anion gap: 10 (ref 5–15)
BUN: 11 mg/dL (ref 6–20)
CO2: 25 mmol/L (ref 22–32)
Calcium: 9.8 mg/dL (ref 8.9–10.3)
Chloride: 102 mmol/L (ref 98–111)
Creatinine, Ser: 0.72 mg/dL (ref 0.44–1.00)
GFR calc Af Amer: >60 mL/min
GFR calc non Af Amer: >60 mL/min
Glucose, Bld: 106 mg/dL — ABNORMAL HIGH (ref 70–99)
Potassium: 4 mmol/L (ref 3.5–5.1)
Sodium: 137 mmol/L (ref 135–145)

## 2018-03-05 NOTE — Progress Notes (Signed)
No MSK abnormality noted on MRI.  Pain likely related to the proximal DVTs.  Ortho will sign off.

## 2018-03-05 NOTE — Progress Notes (Signed)
PROGRESS NOTE    Michaela Little  CLE:[id number] DOB: 1960/09/23 DOA: 02/27/2018 PCP: Clent Demark, PA-C   Brief Narrative:  HPI on 02/27/2018 by Dr. Merton Border Michaela Little  is a 57 y.o. female, with past medical history significant for metastatic breast cancer, right leg DVT diagnosed at [facility] 1 month ago, hyperlipidemia and obstructive sleep apnea presenting today for evaluation of few days history of chest pain shortness of breath.  The patient was started on Lovenox for right leg DVT which had to be stopped due to severe anemia.  At that time goals of care were discussed with the patient at [facility] due to her grave prognosis and refusal of blood transfusion due to religious beliefs (Jehovah's Witness).  Patient was home and she presented today to the ER where she was diagnosed with pulmonary embolism and work-up in the emergency room showed also that her hemoglobin is 5.8 and platelet count is 71,000.  I spent some time discussing the situation with the patient .  It was quite complex and I got Dr. Loanne Drilling from pulmonary critical care to help me explain the situation to her.  On one hand she has a pulmonary embolus that might kill her and the treatment might also be lethal due to her low platelet count and low hemoglobin.  Patient refused blood products transfusion.  Had a gravity of her situation was stressed especially that she has end-stage metastatic cancer.  Patient wanted to be started on heparin and the IVC filter to be placed in view of her recently diagnosed right leg DVT.  We agreed to go ahead with that  Interim history Patient admitted for pulmonary embolism in the setting of hypercoagulable state secondary to cancer.  Found to have right lower extremity DVTs.  Assessment & Plan   Dyspnea secondary to New pulmonary embolism in the setting of hypercoagulable state secondary to cancer -Was initially placed on IV heparin and transition to Lovenox -Interventional  radiology consulted for IVC filter placement -CTA showed moderate sized pulmonary emboli on the right without evidence of right heart strain.  Progressive bony metastatic disease. -Lower Extremity Doppler acute DVT involving right common femoral, femoral, proximal profunda, popliteal, posterior tibial veins.  No evidence of DVT left lower extremity. -Echocardiogram showed an EF of 65 to 94%, grade 1 diastolic dysfunction -MRI of the right hip showed probable acute thrombosis of the right common femoral vein, external iliac and adjacent superficial veins in the proximal right thigh.  Diffuse abnormal marrow signal, could represent red marrow reactivation or diffuse metastatic disease. -Interventional radiology consulted and appreciated for possible prevention of right lower extremity DVTs however given her thrombocytopenia and anemia doubt there will be much to offer  Right hip pain -Orthopedics consulted and appreciated, recommended MRI -MRI as above -Orthopedics has signed off -Continue conservative management  Fever -Possibly secondary to the above, T-max of 99 F  Right upper arm swelling -Secondary to infiltrated IV -Continue to follow clinically and offer supportive care  Headache -Currently no focal deficits -Continue Tylenol as needed -Patient also had episode of numbness which is resolved  Left breast cancer  -status post mastectomy, SRT, tamoxifen -Hematology/oncology, Dr. Burr Medico consulted and appreciated -Currently not on chemotherapy  Severe anemia/thrombocytopenia -Likely secondary to metastatic cancer -Patient is a Jehovah's Witness -Hemoglobin currently 4.4 -Continue epo every 2 weeks -Patient wishes to have EPO weekly.  Discussed this with Dr. Burr Medico who feels that the risk of using weekly can increase patient's risk of  blood clots as well as tumor growth -Continue to monitor CBC  COPD -Stable  Goals of care -Patient has a very guarded prognosis and will need to  discuss further goals of care with her oncologist as well as palliative care  Deconditioning -PT consulted recommending home health  DVT Prophylaxis  lovenox  Code Status: Full  Family Communication: Family at bedside  Disposition Plan: Admitted. Suspect discharged to home on 03/06/2018.  Pending IR consultation  Consultants Interventional radiology Hematology oncology Orthopedic surgery  Procedures  Lower extremity Doppler Echocardiogram  Antibiotics   Anti-infectives (From admission, onward)   None      Subjective:   Michaela Little seen and examined today.  Continues to complain of right hip and leg pain particularly in the thigh.  Denies current chest pain, shortness breath, abdominal pain, nausea or vomiting, diarrhea or constipation, dizziness or headache.  Objective:   Vitals:   03/05/18 0453 03/05/18 0830 03/05/18 0850 03/05/18 1228  BP: (!) 150/70 125/86  (!) 152/72  Pulse: 92 95  87  Resp:  11  14  Temp:  98.1 F (36.7 C)  98 F (36.7 C)  TempSrc:  Oral  Oral  SpO2: 98% 100% 98% 100%  Weight: 113 kg       Intake/Output Summary (Last 24 hours) at 03/05/2018 1635 Last data filed at 03/05/2018 0800 Gross per 24 hour  Intake 480 ml  Output -  Net 480 ml   Filed Weights   03/02/18 0700 03/03/18 0431 03/05/18 0453  Weight: 109.9 kg 112.3 kg 113 kg    Exam  General: Well developed, well nourished, NAD, appears stated age  48: NCAT, mucous membranes moist.   Neck: Supple  Cardiovascular: S1 S2 auscultated, no murmurs, RRR  Respiratory: Clear to auscultation bilaterally with equal chest rise  Abdomen: Soft, nontender, nondistended, + bowel sounds  Extremities: warm dry without cyanosis clubbing or edema.  TTP lateral right hip.  Neuro: AAOx3, nonfocal  Psych: Appropriate mood and affect   Data Reviewed: I have personally reviewed following labs and imaging studies  CBC: Recent Labs  Lab 03/01/18 0610  03/02/18 0613   03/02/18 2147 03/03/18 0602 03/03/18 1410 03/04/18 0419 03/05/18 0415  WBC 6.3  --  4.9  --   --  3.8*  --  4.6 4.1  NEUTROABS 4.1  --  2.1  --   --   --   --   --   --   HGB 5.4*   < > 4.9*   < > 4.5* 4.5* 4.5* 4.5* 4.4*  HCT 18.8*  --  17.0*  --   --  15.7*  --  15.4* 15.2*  MCV 95.9  --  96.6  --   --  96.3  --  95.1 98.1  PLT 83*  --  80*  --   --  75*  --  70* 71*   < > = values in this interval not displayed.   Basic Metabolic Panel: Recent Labs  Lab 02/27/18 1237 03/01/18 0610 03/03/18 0602 03/05/18 0415  NA 138 138  137 137 137  K 3.6 4.0  4.0 3.9 4.0  CL 104 103  103 103 102  CO2 22 23  23 22 25   GLUCOSE 129* 131*  130* 118* 106*  BUN 11 10  10 11 11   CREATININE 0.71 0.81  0.79 0.78 0.72  CALCIUM 9.9 10.0  10.0 9.8 9.8  PHOS  --  4.5 4.9*  --  GFR: Estimated Creatinine Clearance: 98.3 mL/min (by C-G formula based on SCr of 0.72 mg/dL). Liver Function Tests: Recent Labs  Lab 03/01/18 0610 03/03/18 0602  AST 22  --   ALT 15  --   ALKPHOS 114  --   BILITOT 2.0*  --   PROT 7.0  --   ALBUMIN 3.5  3.5 3.1*   No results for input(s): LIPASE, AMYLASE in the last 168 hours. No results for input(s): AMMONIA in the last 168 hours. Coagulation Profile: Recent Labs  Lab 02/28/18 1430  INR 1.31   Cardiac Enzymes: No results for input(s): CKTOTAL, CKMB, CKMBINDEX, TROPONINI in the last 168 hours. BNP (last 3 results) No results for input(s): PROBNP in the last 8760 hours. HbA1C: No results for input(s): HGBA1C in the last 72 hours. CBG: No results for input(s): GLUCAP in the last 168 hours. Lipid Profile: No results for input(s): CHOL, HDL, LDLCALC, TRIG, CHOLHDL, LDLDIRECT in the last 72 hours. Thyroid Function Tests: No results for input(s): TSH, T4TOTAL, FREET4, T3FREE, THYROIDAB in the last 72 hours. Anemia Panel: No results for input(s): VITAMINB12, FOLATE, FERRITIN, TIBC, IRON, RETICCTPCT in the last 72 hours. Urine analysis:     Component Value Date/Time   COLORURINE YELLOW 08/27/2017 [place] 08/27/2017 2344   LABSPEC >1.046 (H) 08/27/2017 2344   LABSPEC 1.025 01/11/2016 1233   PHURINE 6.0 08/27/2017 2344   GLUCOSEU NEGATIVE 08/27/2017 2344   GLUCOSEU Negative 01/11/2016 1233   HGBUR NEGATIVE 08/27/2017 2344   BILIRUBINUR NEGATIVE 08/27/2017 2344   BILIRUBINUR small 12/03/2016 1117   BILIRUBINUR Negative 01/11/2016 1233   KETONESUR NEGATIVE 08/27/2017 2344   PROTEINUR NEGATIVE 08/27/2017 2344   UROBILINOGEN 1.0 12/03/2016 1117   UROBILINOGEN 1.0 09/13/2016 1306   UROBILINOGEN 0.2 01/11/2016 1233   NITRITE NEGATIVE 08/27/2017 2344   LEUKOCYTESUR NEGATIVE 08/27/2017 2344   LEUKOCYTESUR Small 01/11/2016 1233   Sepsis Labs: @LABRCNTIP (procalcitonin:4,lacticidven:4)  ) Recent Results (from the past 240 hour(s))  MRSA PCR Screening     Status: None   Collection Time: 02/27/18 10:11 PM  Result Value Ref Range Status   MRSA by PCR NEGATIVE NEGATIVE Final    Comment:        The GeneXpert MRSA Assay (FDA approved for NASAL specimens only), is one component of a comprehensive MRSA colonization surveillance program. It is not intended to diagnose MRSA infection nor to guide or monitor treatment for MRSA infections. Performed at [facility], [place] [address], [place], [place] [address]       Radiology Studies: Mr Pelvis Wo Contrast  Result Date: 03/05/2018 CLINICAL DATA:  Right hip pain and right groin pain. EXAM: MRI PELVIS WITHOUT CONTRAST TECHNIQUE: Multiplanar multisequence MR imaging of the pelvis was performed. No intravenous contrast was administered. COMPARISON:  PET-CT dated 01/15/2018 FINDINGS: Urinary Tract:  No abnormality visualized. Bowel:  Unremarkable visualized pelvic bowel loops. Vascular/Lymphatic: Probable acute thrombosis of the right femoral vein and external iliac vein and superficial veins in the anterior aspect of the proximal right thigh. No visible  adenopathy. Reproductive:  Hysterectomy.  No adnexal masses. Musculoskeletal: Diffuse patchy abnormal signal from all of the visualized bones consistent with either reactivated red marrow or less likely diffuse metastatic disease to the marrow. Extensive subcutaneous and muscle edema in the proximal right thigh felt to be due to the venous thrombosis. IMPRESSION: Probable acute thrombosis of the right femoral vein and right external iliac vein and superficial veins of the anterior aspect of the right thigh  with secondary muscle and subcutaneous edema. Electronically Signed   By: Lorriane Shire M.D.   On: 03/05/2018 15:23   Mr Hip Right Wo Contrast  Result Date: 03/05/2018 CLINICAL DATA:  Right hip pain. Right groin pain. History of metastatic breast cancer. EXAM: MR OF THE RIGHT HIP WITHOUT CONTRAST TECHNIQUE: Multiplanar, multisequence MR imaging was performed. No intravenous contrast was administered. COMPARISON:  Radiographs dated 03/03/2018 and PET-CT dated 01/15/2018 and CT scan of the abdomen and pelvis dated 08/27/2017 FINDINGS: Other findings Miscellaneous: There's extensive thrombosis of the right external iliac and right common femoral vein as well as superficial veins in the proximal right thigh. There's prominent subcutaneous and muscle edema in right thigh primarily in the anterior and lateral aspects. Bones: There's diffuse abnormal signal from all of the bones consistent with either diffuse red marrow reactivation or diffuse metastatic disease to the marrow. There's no bone destruction. No fractures. Articular cartilage and labrum Articular cartilage:  Normal. Labrum:  Normal. Joint or bursal effusion Joint effusion:  None Bursae: Normal. Muscles and tendons Muscles and tendons: Prominent edema in the muscles of the anterior and lateral aspects of the right proximal thigh and hip with adjacent subcutaneous edema. This is felt to be due to acute venous thrombosis as described above. IMPRESSION: 1.  Probable acute thrombosis of the right common femoral vein and right external iliac vein as well as adjacent superficial veins in the proximal right thigh. 2. Diffuse abnormal marrow signal as described on the prior PET-CT scan. This could represent red marrow reactivation or diffuse metastatic disease. 3. No acute bone abnormality. Critical Value/emergent results were called by telephone at the time of interpretation on 03/05/2018 at 2:30 pm to Dr. Sabra Heck, who verbally acknowledged these results. Electronically Signed   By: Lorriane Shire M.D.   On: 03/05/2018 14:36   Dg Hip Unilat With Pelvis 2-3 Views Right  Result Date: 03/03/2018 CLINICAL DATA:  Chronic right hip pain EXAM: DG HIP (WITH OR WITHOUT PELVIS) 2-3V RIGHT COMPARISON:  04/17/2015 FINDINGS: SI joints are non widened. Pubic symphysis and rami are intact. No fracture or malalignment. Mild to moderate degenerative change of the right hip. 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 No acute osseous abnormality. Electronically Signed   By: Donavan Foil M.D.   On: 03/03/2018 21:00     Scheduled Meds: . acetaminophen  650 mg Oral Q6H  . enoxaparin (LOVENOX) injection  1 mg/kg Subcutaneous Q12H  . fluticasone furoate-vilanterol  1 puff Inhalation Daily  . folic acid  1 mg Oral Daily  . Influenza vac split quadrivalent PF  0.5 mL Intramuscular Tomorrow-1000  . montelukast  10 mg Oral QHS  . pantoprazole  40 mg Oral Daily  . polyethylene glycol  17 g Oral Daily  . sodium chloride flush  3 mL Intravenous Q12H  . traMADol  100 mg Oral Q6H  . cyanocobalamin  1,000 mcg Oral Daily   Continuous Infusions: . sodium chloride Stopped (02/28/18 0321)     LOS: 6 days   Time Spent in minutes   30 minutes  Skyleigh Windle D.O. on 03/05/2018 at 4:35 PM  Between 7am to 7pm - Please see pager noted on [email or web address]  After 7pm go to [email or web address]  And look for the night coverage person covering for me after hours  [facility]  Group Office  [phone]

## 2018-03-05 NOTE — Progress Notes (Signed)
Request to IR for possible thrombectomy vs thrombolysis in patient with history of metastatic breast cancer, recent PE and placement of IVC filter in IR on 03/01/18. Patient with new onset c/o right hip pain for 3-4 days, orthopedics was consulted who recommended MRI which was performed and showed probable acute thrombosis of the right common femoral vein, external iliac and adjacent superficial veins in the proximal right thigh.  This case was reviewed in detail by Dr. Laurence Ferrari today and decision was made to discuss possible intervention with novel thrombectomy device. Dr. Laurence Ferrari and myself presented to patient's room today to discuss possible intervention with Inari Thrombectomy device - risks and benefits were explained in detail to patient and visitor at bedside. It was emphasized that she is a very poor candidate for thrombolysis or traditional thrombectomy given profound anemia and refusal of blood products due to being a Jehovah's Witness and as such these procedures would NOT be recommended. Discussed that the newer thrombectomy device carries a significantly lower risk of bleeding, although a risk of bleeding still remains and if patient were to undergo this procedure and experienced a bleeding event it could possibly cause death. Patient understands all of the above and asks for some time to discuss possible procedure with her family.  If patient agrees to procedure IR will be unable to accommodate procedure until Saturday 12/14 as this procedure is not performed by all interventional radiologists. Current recommendations from IR are for patient to remain on Lovenox, continue to assess for improvement in right hip pain and discuss possible procedure with patient on Friday 12/13 if pain is not improved or continues to worsen.  IR will continue to follow.  Please call with questions or concerns.  Candiss Norse, PA-C Pager# [phone]

## 2018-03-05 NOTE — Progress Notes (Signed)
ANTICOAGULATION CONSULT NOTE - Follow Up Consult  Pharmacy Consult for Lovenox Indication: DVT/PE  Allergies  Allergen Reactions  . Lisinopril-Hydrochlorothiazide Itching  . Adhesive [Tape] Itching and Other (See Comments)    Redness  . Fentanyl Other (See Comments)    GI upset and drowsiness. Only to [facility]  . Gabapentin Other (See Comments)    Auditory hallucinations  . Hctz [Hydrochlorothiazide] Itching  . Latex Itching  . Lisinopril Itching  . Losartan Potassium Other (See Comments)    Makes her feel "bad"   . Other Other (See Comments)    Patient refuses blood for religious reasons (per her notes)  . Oxycodone     GI upset, drowsy  . Tums [Calcium Carbonate Antacid] Hives    FRUIT-FLAVORED ONES    Patient Measurements: Weight: 249 lb 1.9 oz (113 kg)  Vital Signs: BP: 150/70 (12/11 0453) Pulse Rate: 92 (12/11 0453)  Labs: Recent Labs    03/03/18 0602  03/04/18 0419 03/05/18 0415  HGB 4.5*   < > 4.5* 4.4*  HCT 15.7*  --  15.4* 15.2*  PLT 75*  --  70* 71*  CREATININE 0.78  --   --  0.72   < > = values in this interval not displayed.    Estimated Creatinine Clearance: 98.3 mL/min (by C-G formula based on SCr of 0.72 mg/dL).   Assessment:  Anticoag: Heparin this admission for acute PE/DVT. Admit Hgb 5.4 (usual Hgb in 4-5 range) and plt 83 (also at baseline). No bleeding noted. Hem/onc following. H/o DVT previously on lovenox, stopped r/t worsening anemia. Pt was apparently told to restart but stopped on her own due to risk of bleeding. IVC filter placed 12/7. Change back to Lovenox 12/8 - confirmed w/ Samtani ok w/ low hg - Hgb 4.4, Plts 71, Scr 0.72 all stable  Goal of Therapy:  Anti-Xa level 0.6-1 units/ml 4hrs after LMWH dose given Monitor platelets by anticoagulation protocol: Yes   Plan:  Lovenox 110mg /12h. Monitor CBC at least q72h inpatient, s/sx bleeding   Saatvik Thielman S. Alford Highland, PharmD, BCPS Clinical Staff Pharmacist Eilene Ghazi  [place] 03/05/2018,7:57 AM

## 2018-03-06 LAB — CBC
HCT: 15.8 % — ABNORMAL LOW (ref 36.0–46.0)
Hemoglobin: 4.4 g/dL — CL (ref 12.0–15.0)
MCH: 27.5 pg (ref 26.0–34.0)
MCHC: 27.8 g/dL — ABNORMAL LOW (ref 30.0–36.0)
MCV: 98.8 fL (ref 80.0–100.0)
Platelets: 78 10*3/uL — ABNORMAL LOW (ref 150–400)
RBC: 1.6 MIL/uL — ABNORMAL LOW (ref 3.87–5.11)
RDW: 19.4 % — ABNORMAL HIGH (ref 11.5–15.5)
WBC: 5.5 10*3/uL (ref 4.0–10.5)
nRBC: 0 % (ref 0.0–0.2)

## 2018-03-06 NOTE — Progress Notes (Signed)
PROGRESS NOTE    KENLEA WOODELL  RUE:[id number] DOB: 19-Mar-1961 DOA: 02/27/2018 PCP: Clent Demark, PA-C   Brief Narrative:  HPI on 02/27/2018 by Dr. Merton Border Saylee Sherrill  is a 57 y.o. female, with past medical history significant for metastatic breast cancer, right leg DVT diagnosed at [facility] 1 month ago, hyperlipidemia and obstructive sleep apnea presenting today for evaluation of few days history of chest pain shortness of breath.  The patient was started on Lovenox for right leg DVT which had to be stopped due to severe anemia.  At that time goals of care were discussed with the patient at [facility] due to her grave prognosis and refusal of blood transfusion due to religious beliefs (Jehovah's Witness).  Patient was home and she presented today to the ER where she was diagnosed with pulmonary embolism and work-up in the emergency room showed also that her hemoglobin is 5.8 and platelet count is 71,000.  I spent some time discussing the situation with the patient .  It was quite complex and I got Dr. Loanne Drilling from pulmonary critical care to help me explain the situation to her.  On one hand she has a pulmonary embolus that might kill her and the treatment might also be lethal due to her low platelet count and low hemoglobin.  Patient refused blood products transfusion.  Had a gravity of her situation was stressed especially that she has end-stage metastatic cancer.  Patient wanted to be started on heparin and the IVC filter to be placed in view of her recently diagnosed right leg DVT.  We agreed to go ahead with that  Interim history Patient admitted for pulmonary embolism in the setting of hypercoagulable state secondary to cancer.  Found to have right lower extremity DVTs- IR consulted.  Assessment & Plan   Dyspnea secondary to New pulmonary embolism in the setting of hypercoagulable state secondary to cancer -Was initially placed on IV heparin and transition to  Lovenox -Interventional radiology consulted for IVC filter placement -CTA showed moderate sized pulmonary emboli on the right without evidence of right heart strain.  Progressive bony metastatic disease. -Lower Extremity Doppler acute DVT involving right common femoral, femoral, proximal profunda, popliteal, posterior tibial veins.  No evidence of DVT left lower extremity. -Echocardiogram showed an EF of 65 to 14%, grade 1 diastolic dysfunction -MRI of the right hip showed probable acute thrombosis of the right common femoral vein, external iliac and adjacent superficial veins in the proximal right thigh.  Diffuse abnormal marrow signal, could represent red marrow reactivation or diffuse metastatic disease. -Interventional radiology consulted and appreciated for possible prevention of right lower extremity DVTs however given her thrombocytopenia and anemia doubt there will be much to offer -pending decision regarding IR procedure/intervention- patient states she will make her decision on Friday, 03/07/2018, with procedure day on 12/14  Right hip pain -Orthopedics consulted and appreciated, recommended MRI -MRI as above -Orthopedics has signed off -Continue conservative management  Fever -Possibly secondary to the above, T-max of 99 F  Right upper arm swelling -Secondary to infiltrated IV -Continue to follow clinically and offer supportive care  Headache -Currently no focal deficits -Continue Tylenol as needed -Patient also had episode of numbness which is resolved  Left breast cancer  -status post mastectomy, SRT, tamoxifen -Hematology/oncology, Dr. Burr Medico consulted and appreciated -Currently not on chemotherapy  Severe anemia/thrombocytopenia -Likely secondary to metastatic cancer -Patient is a Jehovah's Witness -Hemoglobin currently 4.4 -Continue epo every 2 weeks -Patient wishes to  have EPO weekly.  Discussed this with Dr. Burr Medico who feels that the risk of using weekly can  increase patient's risk of blood clots as well as tumor growth -Continue to monitor CBC  COPD -Stable  Goals of care -Patient has a very guarded prognosis and will need to discuss further goals of care with her oncologist as well as palliative care  Deconditioning -PT consulted recommending home health  DVT Prophylaxis  lovenox  Code Status: Full  Family Communication: Family at bedside  Disposition Plan: Admitted. Suspect discharged to home pending patient's decision regarding IR intervention on 12/13.   Consultants Interventional radiology Hematology oncology Orthopedic surgery  Procedures  Lower extremity Doppler Echocardiogram  Antibiotics   Anti-infectives (From admission, onward)   None      Subjective:   Kayron Odriscoll seen and examined today.  Continues to complain of right hip and leg pain particularly in thigh.  Denies current chest pain, shortness of breath, abdominal pain, nausea or vomiting, diarrhea constipation, dizziness or headache.   Objective:   Vitals:   03/06/18 0442 03/06/18 0726 03/06/18 0802 03/06/18 0828  BP: (!) 131/49   (!) 152/63  Pulse: 90     Resp: 19     Temp: 99.1 F (37.3 C)   98.7 F (37.1 C)  TempSrc: Oral   Oral  SpO2: 99% 95%    Weight: 113.8 kg     Height:   5\' 7"  (1.702 m)    No intake or output data in the 24 hours ending 03/06/18 1248 Filed Weights   03/03/18 0431 03/05/18 0453 03/06/18 0442  Weight: 112.3 kg 113 kg 113.8 kg   Exam  General: Well developed, well nourished, NAD, appears stated age  19: NCAT, mucous membranes moist.   Neck: Supple  Cardiovascular: S1 S2 auscultated, RRR, no murmurs  Respiratory: Clear to auscultation bilaterally with equal chest rise  Abdomen: Soft, nontender, nondistended, + bowel sounds  Extremities: warm dry without cyanosis clubbing or edema. TTP lateral right hip  Neuro: AAOx3, nonfocal  Psych: Appropriate mood and affect   Data Reviewed: I have personally  reviewed following labs and imaging studies  CBC: Recent Labs  Lab 03/01/18 0610  03/02/18 0613  03/03/18 0602 03/03/18 1410 03/04/18 0419 03/05/18 0415 03/06/18 0331  WBC 6.3  --  4.9  --  3.8*  --  4.6 4.1 5.5  NEUTROABS 4.1  --  2.1  --   --   --   --   --   --   HGB 5.4*   < > 4.9*   < > 4.5* 4.5* 4.5* 4.4* 4.4*  HCT 18.8*  --  17.0*  --  15.7*  --  15.4* 15.2* 15.8*  MCV 95.9  --  96.6  --  96.3  --  95.1 98.1 98.8  PLT 83*  --  80*  --  75*  --  70* 71* 78*   < > = values in this interval not displayed.   Basic Metabolic Panel: Recent Labs  Lab 03/01/18 0610 03/03/18 0602 03/05/18 0415  NA 138  137 137 137  K 4.0  4.0 3.9 4.0  CL 103  103 103 102  CO2 23  23 22 25   GLUCOSE 131*  130* 118* 106*  BUN 10  10 11 11   CREATININE 0.81  0.79 0.78 0.72  CALCIUM 10.0  10.0 9.8 9.8  PHOS 4.5 4.9*  --    GFR: Estimated Creatinine Clearance: 98.6 mL/min (by C-G formula  based on SCr of 0.72 mg/dL). Liver Function Tests: Recent Labs  Lab 03/01/18 0610 03/03/18 0602  AST 22  --   ALT 15  --   ALKPHOS 114  --   BILITOT 2.0*  --   PROT 7.0  --   ALBUMIN 3.5  3.5 3.1*   No results for input(s): LIPASE, AMYLASE in the last 168 hours. No results for input(s): AMMONIA in the last 168 hours. Coagulation Profile: Recent Labs  Lab 02/28/18 1430  INR 1.31   Cardiac Enzymes: No results for input(s): CKTOTAL, CKMB, CKMBINDEX, TROPONINI in the last 168 hours. BNP (last 3 results) No results for input(s): PROBNP in the last 8760 hours. HbA1C: No results for input(s): HGBA1C in the last 72 hours. CBG: No results for input(s): GLUCAP in the last 168 hours. Lipid Profile: No results for input(s): CHOL, HDL, LDLCALC, TRIG, CHOLHDL, LDLDIRECT in the last 72 hours. Thyroid Function Tests: No results for input(s): TSH, T4TOTAL, FREET4, T3FREE, THYROIDAB in the last 72 hours. Anemia Panel: No results for input(s): VITAMINB12, FOLATE, FERRITIN, TIBC, IRON, RETICCTPCT in  the last 72 hours. Urine analysis:    Component Value Date/Time   COLORURINE YELLOW 08/27/2017 [place] 08/27/2017 2344   LABSPEC >1.046 (H) 08/27/2017 2344   LABSPEC 1.025 01/11/2016 1233   PHURINE 6.0 08/27/2017 2344   GLUCOSEU NEGATIVE 08/27/2017 2344   GLUCOSEU Negative 01/11/2016 1233   HGBUR NEGATIVE 08/27/2017 2344   BILIRUBINUR NEGATIVE 08/27/2017 2344   BILIRUBINUR small 12/03/2016 1117   BILIRUBINUR Negative 01/11/2016 1233   KETONESUR NEGATIVE 08/27/2017 2344   PROTEINUR NEGATIVE 08/27/2017 2344   UROBILINOGEN 1.0 12/03/2016 1117   UROBILINOGEN 1.0 09/13/2016 1306   UROBILINOGEN 0.2 01/11/2016 1233   NITRITE NEGATIVE 08/27/2017 2344   LEUKOCYTESUR NEGATIVE 08/27/2017 2344   LEUKOCYTESUR Small 01/11/2016 1233   Sepsis Labs: @LABRCNTIP (procalcitonin:4,lacticidven:4)  ) Recent Results (from the past 240 hour(s))  MRSA PCR Screening     Status: None   Collection Time: 02/27/18 10:11 PM  Result Value Ref Range Status   MRSA by PCR NEGATIVE NEGATIVE Final    Comment:        The GeneXpert MRSA Assay (FDA approved for NASAL specimens only), is one component of a comprehensive MRSA colonization surveillance program. It is not intended to diagnose MRSA infection nor to guide or monitor treatment for MRSA infections. Performed at [facility], [place] [address], [place], [place] [address]       Radiology Studies: Mr Pelvis Wo Contrast  Result Date: 03/05/2018 CLINICAL DATA:  Right hip pain and right groin pain. EXAM: MRI PELVIS WITHOUT CONTRAST TECHNIQUE: Multiplanar multisequence MR imaging of the pelvis was performed. No intravenous contrast was administered. COMPARISON:  PET-CT dated 01/15/2018 FINDINGS: Urinary Tract:  No abnormality visualized. Bowel:  Unremarkable visualized pelvic bowel loops. Vascular/Lymphatic: Probable acute thrombosis of the right femoral vein and external iliac vein and superficial veins in the anterior aspect of the  proximal right thigh. No visible adenopathy. Reproductive:  Hysterectomy.  No adnexal masses. Musculoskeletal: Diffuse patchy abnormal signal from all of the visualized bones consistent with either reactivated red marrow or less likely diffuse metastatic disease to the marrow. Extensive subcutaneous and muscle edema in the proximal right thigh felt to be due to the venous thrombosis. IMPRESSION: Probable acute thrombosis of the right femoral vein and right external iliac vein and superficial veins of the anterior aspect of the right thigh with secondary muscle and subcutaneous edema. Electronically Signed  By: Lorriane Shire M.D.   On: 03/05/2018 15:23   Mr Hip Right Wo Contrast  Result Date: 03/05/2018 CLINICAL DATA:  Right hip pain. Right groin pain. History of metastatic breast cancer. EXAM: MR OF THE RIGHT HIP WITHOUT CONTRAST TECHNIQUE: Multiplanar, multisequence MR imaging was performed. No intravenous contrast was administered. COMPARISON:  Radiographs dated 03/03/2018 and PET-CT dated 01/15/2018 and CT scan of the abdomen and pelvis dated 08/27/2017 FINDINGS: Other findings Miscellaneous: There's extensive thrombosis of the right external iliac and right common femoral vein as well as superficial veins in the proximal right thigh. There's prominent subcutaneous and muscle edema in right thigh primarily in the anterior and lateral aspects. Bones: There's diffuse abnormal signal from all of the bones consistent with either diffuse red marrow reactivation or diffuse metastatic disease to the marrow. There's no bone destruction. No fractures. Articular cartilage and labrum Articular cartilage:  Normal. Labrum:  Normal. Joint or bursal effusion Joint effusion:  None Bursae: Normal. Muscles and tendons Muscles and tendons: Prominent edema in the muscles of the anterior and lateral aspects of the right proximal thigh and hip with adjacent subcutaneous edema. This is felt to be due to acute venous thrombosis  as described above. IMPRESSION: 1. Probable acute thrombosis of the right common femoral vein and right external iliac vein as well as adjacent superficial veins in the proximal right thigh. 2. Diffuse abnormal marrow signal as described on the prior PET-CT scan. This could represent red marrow reactivation or diffuse metastatic disease. 3. No acute bone abnormality. Critical Value/emergent results were called by telephone at the time of interpretation on 03/05/2018 at 2:30 pm to Dr. Sabra Heck, who verbally acknowledged these results. Electronically Signed   By: Lorriane Shire M.D.   On: 03/05/2018 14:36     Scheduled Meds: . acetaminophen  650 mg Oral Q6H  . enoxaparin (LOVENOX) injection  1 mg/kg Subcutaneous Q12H  . fluticasone furoate-vilanterol  1 puff Inhalation Daily  . folic acid  1 mg Oral Daily  . Influenza vac split quadrivalent PF  0.5 mL Intramuscular Tomorrow-1000  . montelukast  10 mg Oral QHS  . pantoprazole  40 mg Oral Daily  . polyethylene glycol  17 g Oral Daily  . sodium chloride flush  3 mL Intravenous Q12H  . traMADol  100 mg Oral Q6H  . cyanocobalamin  1,000 mcg Oral Daily   Continuous Infusions: . sodium chloride Stopped (02/28/18 0321)     LOS: 7 days   Time Spent in minutes   30 minutes  Nicolet Griffy D.O. on 03/06/2018 at 12:48 PM  Between 7am to 7pm - Please see pager noted on [email or web address]  After 7pm go to [email or web address]  And look for the night coverage person covering for me after hours  [facility] Office  [phone]

## 2018-03-06 NOTE — Care Management Important Message (Signed)
Important Message  Patient Details  Name: Michaela Little MRN: [id number] Date of Birth: Sep 09, 1960   Medicare Important Message Given:  Yes    Surie Suchocki P Samella Lucchetti 03/06/2018, 2:40 PM

## 2018-03-06 NOTE — Progress Notes (Signed)
Michaela Little   DOB:[birth year]   WU#:[id number]   [id number]  Oncology f/u  Subjective:Pt underwent MRI for her right hip pain, states her pain is controlled, right leg is still very swelling, has not been out of bed much, no other new complains.   Objective:  Vitals:   03/06/18 0044 03/06/18 0442  BP: (!) 140/52 (!) 131/49  Pulse: 84 90  Resp: 18 19  Temp: 98.9 F (37.2 C) 99.1 F (37.3 C)  SpO2: 100% 99%    Body mass index is 39.28 kg/m.  Intake/Output Summary (Last 24 hours) at 03/06/2018 0640 Last data filed at 03/05/2018 0800 Gross per 24 hour  Intake 480 ml  Output -  Net 480 ml     Sclerae unicteric  Oropharynx clear  No peripheral adenopathy  Lungs clear -- no rales or rhonchi  Heart regular rate and rhythm  Abdomen benign  MSK no focal spinal tenderness, bilateral lower extremity edema, R>>L  Neuro nonfocal   CBG (last 3)  No results for input(s): GLUCAP in the last 72 hours.   Labs:  Lab Results  Component Value Date   WBC 5.5 03/06/2018   HGB 4.4 (LL) 03/06/2018   HCT 15.8 (L) 03/06/2018   MCV 98.8 03/06/2018   PLT 78 (L) 03/06/2018   NEUTROABS 2.1 03/02/2018   CMP Latest Ref Rng & Units 03/05/2018 03/03/2018 03/01/2018  Glucose 70 - 99 mg/dL 106(H) 118(H) 131(H)  BUN 6 - 20 mg/dL 11 11 10   Creatinine 0.44 - 1.00 mg/dL 0.72 0.78 0.81  Sodium 135 - 145 mmol/L 137 137 138  Potassium 3.5 - 5.1 mmol/L 4.0 3.9 4.0  Chloride 98 - 111 mmol/L 102 103 103  CO2 22 - 32 mmol/L 25 22 23   Calcium 8.9 - 10.3 mg/dL 9.8 9.8 10.0  Total Protein 6.5 - 8.1 g/dL - - 7.0  Total Bilirubin 0.3 - 1.2 mg/dL - - 2.0(H)  Alkaline Phos 38 - 126 U/L - - 114  AST 15 - 41 U/L - - 22  ALT 0 - 44 U/L - - 15    Urine Studies No results for input(s): UHGB, CRYS in the last 72 hours.  Invalid input(s): UACOL, UAPR, USPG, UPH, UTP, UGL, UKET, UBIL, UNIT, UROB, ULEU, UEPI, UWBC, [place], [place]  Basic Metabolic Panel: Recent Labs  Lab 02/27/18 1237  03/01/18 0610 03/03/18 0602 03/05/18 0415  NA 138 138  137 137 137  K 3.6 4.0  4.0 3.9 4.0  CL 104 103  103 103 102  CO2 22 23  23 22 25   GLUCOSE 129* 131*  130* 118* 106*  BUN 11 10  10 11 11   CREATININE 0.71 0.81  0.79 0.78 0.72  CALCIUM 9.9 10.0  10.0 9.8 9.8  PHOS  --  4.5 4.9*  --    GFR Estimated Creatinine Clearance: 98.6 mL/min (by C-G formula based on SCr of 0.72 mg/dL). Liver Function Tests: Recent Labs  Lab 03/01/18 0610 03/03/18 0602  AST 22  --   ALT 15  --   ALKPHOS 114  --   BILITOT 2.0*  --   PROT 7.0  --   ALBUMIN 3.5  3.5 3.1*   No results for input(s): LIPASE, AMYLASE in the last 168 hours. No results for input(s): AMMONIA in the last 168 hours. Coagulation profile Recent Labs  Lab 02/28/18 1430  INR 1.31    CBC: Recent Labs  Lab 03/01/18 0610  03/02/18 2956  03/03/18 0602  03/03/18 1410 03/04/18 0419 03/05/18 0415 03/06/18 0331  WBC 6.3  --  4.9  --  3.8*  --  4.6 4.1 5.5  NEUTROABS 4.1  --  2.1  --   --   --   --   --   --   HGB 5.4*   < > 4.9*   < > 4.5* 4.5* 4.5* 4.4* 4.4*  HCT 18.8*  --  17.0*  --  15.7*  --  15.4* 15.2* 15.8*  MCV 95.9  --  96.6  --  96.3  --  95.1 98.1 98.8  PLT 83*  --  80*  --  75*  --  70* 71* 78*   < > = values in this interval not displayed.   Cardiac Enzymes: No results for input(s): CKTOTAL, CKMB, CKMBINDEX, TROPONINI in the last 168 hours. BNP: Invalid input(s): POCBNP CBG: No results for input(s): GLUCAP in the last 168 hours. D-Dimer No results for input(s): DDIMER in the last 72 hours. Hgb A1c No results for input(s): HGBA1C in the last 72 hours. Lipid Profile No results for input(s): CHOL, HDL, LDLCALC, TRIG, CHOLHDL, LDLDIRECT in the last 72 hours. Thyroid function studies No results for input(s): TSH, T4TOTAL, T3FREE, THYROIDAB in the last 72 hours.  Invalid input(s): FREET3 Anemia work up No results for input(s): VITAMINB12, FOLATE, FERRITIN, TIBC, IRON, RETICCTPCT in the last 72  hours. Microbiology Recent Results (from the past 240 hour(s))  MRSA PCR Screening     Status: None   Collection Time: 02/27/18 10:11 PM  Result Value Ref Range Status   MRSA by PCR NEGATIVE NEGATIVE Final    Comment:        The GeneXpert MRSA Assay (FDA approved for NASAL specimens only), is one component of a comprehensive MRSA colonization surveillance program. It is not intended to diagnose MRSA infection nor to guide or monitor treatment for MRSA infections. Performed at [facility], [place] [address], [place],  [address]       Studies:  Mr Pelvis Wo Contrast  Result Date: 03/05/2018 CLINICAL DATA:  Right hip pain and right groin pain. EXAM: MRI PELVIS WITHOUT CONTRAST TECHNIQUE: Multiplanar multisequence MR imaging of the pelvis was performed. No intravenous contrast was administered. COMPARISON:  PET-CT dated 01/15/2018 FINDINGS: Urinary Tract:  No abnormality visualized. Bowel:  Unremarkable visualized pelvic bowel loops. Vascular/Lymphatic: Probable acute thrombosis of the right femoral vein and external iliac vein and superficial veins in the anterior aspect of the proximal right thigh. No visible adenopathy. Reproductive:  Hysterectomy.  No adnexal masses. Musculoskeletal: Diffuse patchy abnormal signal from all of the visualized bones consistent with either reactivated red marrow or less likely diffuse metastatic disease to the marrow. Extensive subcutaneous and muscle edema in the proximal right thigh felt to be due to the venous thrombosis. IMPRESSION: Probable acute thrombosis of the right femoral vein and right external iliac vein and superficial veins of the anterior aspect of the right thigh with secondary muscle and subcutaneous edema. Electronically Signed   By: Lorriane Shire M.D.   On: 03/05/2018 15:23   Mr Hip Right Wo Contrast  Result Date: 03/05/2018 CLINICAL DATA:  Right hip pain. Right groin pain. History of metastatic breast cancer. EXAM: MR OF  THE RIGHT HIP WITHOUT CONTRAST TECHNIQUE: Multiplanar, multisequence MR imaging was performed. No intravenous contrast was administered. COMPARISON:  Radiographs dated 03/03/2018 and PET-CT dated 01/15/2018 and CT scan of the abdomen and pelvis dated 08/27/2017 FINDINGS: Other findings Miscellaneous: There's extensive  thrombosis of the right external iliac and right common femoral vein as well as superficial veins in the proximal right thigh. There's prominent subcutaneous and muscle edema in right thigh primarily in the anterior and lateral aspects. Bones: There's diffuse abnormal signal from all of the bones consistent with either diffuse red marrow reactivation or diffuse metastatic disease to the marrow. There's no bone destruction. No fractures. Articular cartilage and labrum Articular cartilage:  Normal. Labrum:  Normal. Joint or bursal effusion Joint effusion:  None Bursae: Normal. Muscles and tendons Muscles and tendons: Prominent edema in the muscles of the anterior and lateral aspects of the right proximal thigh and hip with adjacent subcutaneous edema. This is felt to be due to acute venous thrombosis as described above. IMPRESSION: 1. Probable acute thrombosis of the right common femoral vein and right external iliac vein as well as adjacent superficial veins in the proximal right thigh. 2. Diffuse abnormal marrow signal as described on the prior PET-CT scan. This could represent red marrow reactivation or diffuse metastatic disease. 3. No acute bone abnormality. Critical Value/emergent results were called by telephone at the time of interpretation on 03/05/2018 at 2:30 pm to Dr. Sabra Heck, who verbally acknowledged these results. Electronically Signed   By: Lorriane Shire M.D.   On: 03/05/2018 14:36    Assessment: 57 y.o. 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  She is a Jehovah witness, and refused blood  transfusion, has been on Aranesp injection.  She was found to have lower extremity DVT about a months ago, not compliant with Lovenox injection due to concern of bleeding.  1. PE and extensive DVT, likely related to metastatic cancer, immobility and Aranesp injection, on Lovenox, s/p IVC filter, on Lovenox  2.  Severe right hip pain, secondary to extensive DVT  3. 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  Moderate thrombocytopenia, secondary to bone metastasis 5.  Metastatic breast cancer to bones and peritoneum, off treatment now 6. HTN 7. Deconditioning    Plan:  -Patient has been refusing blood transfusion due to [place] -She has had IVC filter placed, on Lovenox anticoagulation, no signs of bleeding, hemoglobin stable. -She is quite symptomatic from her right lower extremity DVT with severe hip pain, and leg edema.  She has been evaluated by interventional radiologist Dr. Laurence Ferrari, thrombectomy with Inari device has been offered, risk of bleeding discussed with patient in details, she agrees to proceed. -Please minimize blood draw due to her severe anemia -I again discussed the benefit and potential side effect from Aranesp injection, which including increased risk of thrombosis and tumor growth.  Her anemia only improved slightly (from 3.9 to 5.5) after weekly Aranesp injection, I have recommended to reduce her injection frequency from weekly to every other week.  She voiced understanding.  She is due for injection next Monday, I will arrange as outpatient visit -I again discussed the breast cancer treatment, especially fulvestrant and PiqRay, she declined again, she states she has strong faith on God, and he wants to focus on her acute illness right now. -I will f/u as needed when she is in house. I appreciate the excellent care from the hospitalist team and other specialty teams.   [name], MD 03/05/2018

## 2018-03-07 ENCOUNTER — Other Ambulatory Visit: Payer: Self-pay | Admitting: Physician Assistant

## 2018-03-07 DIAGNOSIS — I82411 Acute embolism and thrombosis of right femoral vein: Secondary | ICD-10-CM

## 2018-03-07 NOTE — Progress Notes (Signed)
PROGRESS NOTE    Michaela Little  JQB:[id number] DOB: 06-11-1960 DOA: 02/27/2018 PCP: Clent Demark, PA-C   Brief Narrative:  HPI on 02/27/2018 by Dr. Merton Border Elspeth Blucher  is a 57 y.o. female, with past medical history significant for metastatic breast cancer, right leg DVT diagnosed at [facility] 1 month ago, hyperlipidemia and obstructive sleep apnea presenting today for evaluation of few days history of chest pain shortness of breath.  The patient was started on Lovenox for right leg DVT which had to be stopped due to severe anemia.  At that time goals of care were discussed with the patient at [facility] due to her grave prognosis and refusal of blood transfusion due to religious beliefs (Jehovah's Witness).  Patient was home and she presented today to the ER where she was diagnosed with pulmonary embolism and work-up in the emergency room showed also that her hemoglobin is 5.8 and platelet count is 71,000.  I spent some time discussing the situation with the patient .  It was quite complex and I got Dr. Loanne Drilling from pulmonary critical care to help me explain the situation to her.  On one hand she has a pulmonary embolus that might kill her and the treatment might also be lethal due to her low platelet count and low hemoglobin.  Patient refused blood products transfusion.  Had a gravity of her situation was stressed especially that she has end-stage metastatic cancer.  Patient wanted to be started on heparin and the IVC filter to be placed in view of her recently diagnosed right leg DVT.  We agreed to go ahead with that  Interim history Patient admitted for pulmonary embolism in the setting of hypercoagulable state secondary to cancer.  Found to have right lower extremity DVTs- IR consulted.  Assessment & Plan   Dyspnea secondary to New pulmonary embolism in the setting of hypercoagulable state secondary to cancer -Was initially placed on IV heparin and transition to  Lovenox -Interventional radiology consulted for IVC filter placement -CTA showed moderate sized pulmonary emboli on the right without evidence of right heart strain.  Progressive bony metastatic disease. -Lower Extremity Doppler acute DVT involving right common femoral, femoral, proximal profunda, popliteal, posterior tibial veins.  No evidence of DVT left lower extremity. -Echocardiogram showed an EF of 65 to 40%, grade 1 diastolic dysfunction -MRI of the right hip showed probable acute thrombosis of the right common femoral vein, external iliac and adjacent superficial veins in the proximal right thigh.  Diffuse abnormal marrow signal, could represent red marrow reactivation or diffuse metastatic disease. -Interventional radiology consulted and appreciated for possible prevention of right lower extremity DVTs however given her thrombocytopenia and anemia doubt there will be much to offer -Patient decided against the procedure but would like more info regarding the procedure -dicussed with IR, patient will be scheduled for outpatient follow up with IR and they will try to find information   Right hip pain -Orthopedics consulted and appreciated, recommended MRI -MRI as above -Orthopedics has signed off -Continue conservative management  Fever -Possibly secondary to the above, T-max of 99 F  Right upper arm swelling -Secondary to infiltrated IV -Continue to follow clinically and offer supportive care  Headache -Currently no focal deficits -Continue Tylenol as needed -Patient also had episode of numbness which is resolved  Left breast cancer  -status post mastectomy, SRT, tamoxifen -Hematology/oncology, Dr. Burr Medico consulted and appreciated -Currently not on chemotherapy  Severe anemia/thrombocytopenia -Likely secondary to metastatic cancer -Patient is  a Jehovah's Witness -Hemoglobin currently 4.4 -Continue epo every 2 weeks -Patient wishes to have EPO weekly.  Discussed this with  Dr. Burr Medico who feels that the risk of using weekly can increase patient's risk of blood clots as well as tumor growth -Continue to monitor CBC  COPD -Stable  Goals of care -Patient has a very guarded prognosis and will need to discuss further goals of care with her oncologist as well as palliative care  Deconditioning -PT consulted recommending home health  DVT Prophylaxis  lovenox  Code Status: Full  Family Communication: Family at bedside  Disposition Plan: Admitted. Suspect discharged to home pending patient's decision regarding IR intervention on 12/13.   Consultants Interventional radiology Hematology oncology Orthopedic surgery  Procedures  Lower extremity Doppler Echocardiogram  Antibiotics   Anti-infectives (From admission, onward)   None      Subjective:   Michaela Little seen and examined today.  Continues to have pain in the right thigh. Denies current chest pain, shortness of breath, abdominal pain, N/V/D/C.   Objective:   Vitals:   03/07/18 0511 03/07/18 0525 03/07/18 0743 03/07/18 0913  BP: (!) 132/54   (!) 133/49  Pulse: 93   89  Resp: 18     Temp: 99.3 F (37.4 C)   98.4 F (36.9 C)  TempSrc: Oral   Oral  SpO2: 100%  100% 97%  Weight:  112.3 kg    Height:        Intake/Output Summary (Last 24 hours) at 03/07/2018 1254 Last data filed at 03/06/2018 2205 Gross per 24 hour  Intake -  Output 1150 ml  Net -1150 ml   Filed Weights   03/05/18 0453 03/06/18 0442 03/07/18 0525  Weight: 113 kg 113.8 kg 112.3 kg   Exam  General: Well developed, well nourished, NAD, appears stated age  20: NCAT, mucous membranes moist.   Neck: Supple  Cardiovascular: S1 S2 auscultated, no murmur, RRR  Respiratory: Clear to auscultation bilaterally with equal chest rise  Abdomen: Soft, nontender, nondistended, + bowel sounds  Extremities: warm dry without cyanosis clubbing or edema  Neuro: AAOx3, nonfocal  Psych: Appropriate mood and  affect   Data Reviewed: I have personally reviewed following labs and imaging studies  CBC: Recent Labs  Lab 03/01/18 0610  03/02/18 0613  03/03/18 0602 03/03/18 1410 03/04/18 0419 03/05/18 0415 03/06/18 0331  WBC 6.3  --  4.9  --  3.8*  --  4.6 4.1 5.5  NEUTROABS 4.1  --  2.1  --   --   --   --   --   --   HGB 5.4*   < > 4.9*   < > 4.5* 4.5* 4.5* 4.4* 4.4*  HCT 18.8*  --  17.0*  --  15.7*  --  15.4* 15.2* 15.8*  MCV 95.9  --  96.6  --  96.3  --  95.1 98.1 98.8  PLT 83*  --  80*  --  75*  --  70* 71* 78*   < > = values in this interval not displayed.   Basic Metabolic Panel: Recent Labs  Lab 03/01/18 0610 03/03/18 0602 03/05/18 0415  NA 138  137 137 137  K 4.0  4.0 3.9 4.0  CL 103  103 103 102  CO2 23  23 22 25   GLUCOSE 131*  130* 118* 106*  BUN 10  10 11 11   CREATININE 0.81  0.79 0.78 0.72  CALCIUM 10.0  10.0 9.8 9.8  PHOS 4.5  4.9*  --    GFR: Estimated Creatinine Clearance: 97.9 mL/min (by C-G formula based on SCr of 0.72 mg/dL). Liver Function Tests: Recent Labs  Lab 03/01/18 0610 03/03/18 0602  AST 22  --   ALT 15  --   ALKPHOS 114  --   BILITOT 2.0*  --   PROT 7.0  --   ALBUMIN 3.5  3.5 3.1*   No results for input(s): LIPASE, AMYLASE in the last 168 hours. No results for input(s): AMMONIA in the last 168 hours. Coagulation Profile: Recent Labs  Lab 02/28/18 1430  INR 1.31   Cardiac Enzymes: No results for input(s): CKTOTAL, CKMB, CKMBINDEX, TROPONINI in the last 168 hours. BNP (last 3 results) No results for input(s): PROBNP in the last 8760 hours. HbA1C: No results for input(s): HGBA1C in the last 72 hours. CBG: No results for input(s): GLUCAP in the last 168 hours. Lipid Profile: No results for input(s): CHOL, HDL, LDLCALC, TRIG, CHOLHDL, LDLDIRECT in the last 72 hours. Thyroid Function Tests: No results for input(s): TSH, T4TOTAL, FREET4, T3FREE, THYROIDAB in the last 72 hours. Anemia Panel: No results for input(s):  VITAMINB12, FOLATE, FERRITIN, TIBC, IRON, RETICCTPCT in the last 72 hours. Urine analysis:    Component Value Date/Time   COLORURINE YELLOW 08/27/2017 2344   APPEARANCEUR CLEAR 08/27/2017 2344   LABSPEC >1.046 (H) 08/27/2017 2344   LABSPEC 1.025 01/11/2016 1233   PHURINE 6.0 08/27/2017 2344   GLUCOSEU NEGATIVE 08/27/2017 2344   GLUCOSEU Negative 01/11/2016 1233   HGBUR NEGATIVE 08/27/2017 2344   BILIRUBINUR NEGATIVE 08/27/2017 2344   BILIRUBINUR small 12/03/2016 1117   BILIRUBINUR Negative 01/11/2016 1233   KETONESUR NEGATIVE 08/27/2017 2344   PROTEINUR NEGATIVE 08/27/2017 2344   UROBILINOGEN 1.0 12/03/2016 1117   UROBILINOGEN 1.0 09/13/2016 1306   UROBILINOGEN 0.2 01/11/2016 1233   NITRITE NEGATIVE 08/27/2017 2344   LEUKOCYTESUR NEGATIVE 08/27/2017 2344   LEUKOCYTESUR Small 01/11/2016 1233   Sepsis Labs: @LABRCNTIP (procalcitonin:4,lacticidven:4)  ) Recent Results (from the past 240 hour(s))  MRSA PCR Screening     Status: None   Collection Time: 02/27/18 10:11 PM  Result Value Ref Range Status   MRSA by PCR NEGATIVE NEGATIVE Final    Comment:        The GeneXpert MRSA Assay (FDA approved for NASAL specimens only), is one component of a comprehensive MRSA colonization surveillance program. It is not intended to diagnose MRSA infection nor to guide or monitor treatment for MRSA infections. Performed at [facility], [place] [address], [place], [place] [address]       Radiology Studies: Mr Pelvis Wo Contrast  Result Date: 03/05/2018 CLINICAL DATA:  Right hip pain and right groin pain. EXAM: MRI PELVIS WITHOUT CONTRAST TECHNIQUE: Multiplanar multisequence MR imaging of the pelvis was performed. No intravenous contrast was administered. COMPARISON:  PET-CT dated 01/15/2018 FINDINGS: Urinary Tract:  No abnormality visualized. Bowel:  Unremarkable visualized pelvic bowel loops. Vascular/Lymphatic: Probable acute thrombosis of the right femoral vein and external iliac  vein and superficial veins in the anterior aspect of the proximal right thigh. No visible adenopathy. Reproductive:  Hysterectomy.  No adnexal masses. Musculoskeletal: Diffuse patchy abnormal signal from all of the visualized bones consistent with either reactivated red marrow or less likely diffuse metastatic disease to the marrow. Extensive subcutaneous and muscle edema in the proximal right thigh felt to be due to the venous thrombosis. IMPRESSION: Probable acute thrombosis of the right femoral vein and right external iliac vein and superficial veins of the  anterior aspect of the right thigh with secondary muscle and subcutaneous edema. Electronically Signed   By: Lorriane Shire M.D.   On: 03/05/2018 15:23   Mr Hip Right Wo Contrast  Result Date: 03/05/2018 CLINICAL DATA:  Right hip pain. Right groin pain. History of metastatic breast cancer. EXAM: MR OF THE RIGHT HIP WITHOUT CONTRAST TECHNIQUE: Multiplanar, multisequence MR imaging was performed. No intravenous contrast was administered. COMPARISON:  Radiographs dated 03/03/2018 and PET-CT dated 01/15/2018 and CT scan of the abdomen and pelvis dated 08/27/2017 FINDINGS: Other findings Miscellaneous: There's extensive thrombosis of the right external iliac and right common femoral vein as well as superficial veins in the proximal right thigh. There's prominent subcutaneous and muscle edema in right thigh primarily in the anterior and lateral aspects. Bones: There's diffuse abnormal signal from all of the bones consistent with either diffuse red marrow reactivation or diffuse metastatic disease to the marrow. There's no bone destruction. No fractures. Articular cartilage and labrum Articular cartilage:  Normal. Labrum:  Normal. Joint or bursal effusion Joint effusion:  None Bursae: Normal. Muscles and tendons Muscles and tendons: Prominent edema in the muscles of the anterior and lateral aspects of the right proximal thigh and hip with adjacent subcutaneous  edema. This is felt to be due to acute venous thrombosis as described above. IMPRESSION: 1. Probable acute thrombosis of the right common femoral vein and right external iliac vein as well as adjacent superficial veins in the proximal right thigh. 2. Diffuse abnormal marrow signal as described on the prior PET-CT scan. This could represent red marrow reactivation or diffuse metastatic disease. 3. No acute bone abnormality. Critical Value/emergent results were called by telephone at the time of interpretation on 03/05/2018 at 2:30 pm to Dr. Sabra Heck, who verbally acknowledged these results. Electronically Signed   By: Lorriane Shire M.D.   On: 03/05/2018 14:36     Scheduled Meds: . acetaminophen  650 mg Oral Q6H  . enoxaparin (LOVENOX) injection  1 mg/kg Subcutaneous Q12H  . fluticasone furoate-vilanterol  1 puff Inhalation Daily  . folic acid  1 mg Oral Daily  . Influenza vac split quadrivalent PF  0.5 mL Intramuscular Tomorrow-1000  . montelukast  10 mg Oral QHS  . pantoprazole  40 mg Oral Daily  . polyethylene glycol  17 g Oral Daily  . sodium chloride flush  3 mL Intravenous Q12H  . traMADol  100 mg Oral Q6H  . cyanocobalamin  1,000 mcg Oral Daily   Continuous Infusions: . sodium chloride Stopped (02/28/18 0321)     LOS: 8 days   Time Spent in minutes   30 minutes  Redith Drach D.O. on 03/07/2018 at 12:54 PM  Between 7am to 7pm - Please see pager noted on [email or web address]  After 7pm go to [email or web address]  And look for the night coverage person covering for me after hours  [facility] Office  [phone]

## 2018-03-07 NOTE — Progress Notes (Addendum)
  I had another discussion with Ms. Swayne this morning. She has a visitor in her room.  She tells me the pain is not "excruciating"  and she would rather avoid the procedure due to her increase risks. Her visitor nodded in agreement.  Recommend continue anticoagulation and consider warm compress to thigh as needed for pain.  Will discontinue order and sign off for now.  *Addendum* Patient would like a follow up OP appointment with Dr. Laurence Ferrari to further discuss treatment. This order has been placed and our office will contact her.   Nayden Czajka S Sharifa Bucholz PA-C 03/07/2018 10:44 AM

## 2018-03-07 NOTE — Progress Notes (Signed)
Physical Therapy Treatment Patient Details Name: Michaela Little MRN: [id number] DOB: 11/30/1960 Today's Date: 03/07/2018    History of Present Illness Pt is a 57 y.o. female admitted 02/27/18 with chest pain and SOB; found to have new pulmonary embolism in the setting of hypercoagulable secondary to end-stage metastatic breast CA. CTA also showing progressive bony metastatic disease. R hip MRI showed probable acute thrombosis in multiple veins in proximal R thigh; potential diffuse metastatic disease. LE doppler shouing LLE DVT. Treatment difficult secondary to h/o thrombocytopenia and anemia (Hgb 4-5). Other PMH includes HTN, bronchitis, cervical stenosis, OA, anxiety, asthma.   PT Comments    Pt slowly progressing with mobility. Limited by continued c/o significant R hip pain with all movement. Requires min guard to stand and take steps with RW; dependent for pericare. Daughter present and supportive. Max education on importance of calling staff for assist, as pt had transferred to [facility] with assist from daughter but was unable to transfer back to bed without assist from PT. Pt remains at increased risk for falls. Reports owns wheelchair if needed for home use. Will continue to follow acutely.   Follow Up Recommendations  Home health PT;Supervision for mobility/OOB     Equipment Recommendations  None recommended by PT    Recommendations for Other Services       Precautions / Restrictions Precautions Precautions: Fall Restrictions Weight Bearing Restrictions: No    Mobility  Bed Mobility Overal bed mobility: Needs Assistance Bed Mobility: Sit to Supine       Sit to supine: Mod assist   General bed mobility comments: Received sitting on BSC; modA to assist BLEs to long sitting, pt able to readjust from there  Transfers Overall transfer level: Needs assistance Equipment used: Rolling walker (2 wheeled) Transfers: Sit to/from Stand Sit to Stand: Min assist;Min guard          General transfer comment: Significant increased time and effort standing from [facility] secondary to R hip pain; able to eventually stand with min guard, cues for technique/correct hand placement  Ambulation/Gait Ambulation/Gait assistance: Min guard Gait Distance (Feet): 2 Feet Assistive device: Rolling walker (2 wheeled) Gait Pattern/deviations: Step-to pattern;Decreased stride length;Wide base of support;Antalgic;Decreased weight shift to right Gait velocity: Decreased   General Gait Details: Took steps with RW and min guard for balance; heavy reliance on UE support due to pain WB on RLE. No physical assist required   Stairs             Wheelchair Mobility    Modified Rankin (Stroke Patients Only)       Balance Overall balance assessment: Needs assistance Sitting-balance support: Feet supported Sitting balance-Leahy Scale: Good     Standing balance support: Bilateral upper extremity supported;During functional activity Standing balance-Leahy Scale: Poor                              Cognition Arousal/Alertness: Awake/alert Behavior During Therapy: WFL for tasks assessed/performed Overall Cognitive Status: Within Functional Limits for tasks assessed                                        Exercises      General Comments General comments (skin integrity, edema, etc.): Daughter present attempting to assist pt get cleaned up sitting on BSC. Education on waiting for staff assist for future OOB mobility to decrease  fall risk      Pertinent Vitals/Pain Pain Assessment: Faces Faces Pain Scale: Hurts whole lot Pain Location: R lateral hip Pain Descriptors / Indicators: Sharp;Pressure;Grimacing;Guarding;Moaning Pain Intervention(s): Monitored during session;Limited activity within patient's tolerance    Home Living                      Prior Function            PT Goals (current goals can now be found in the care plan section)  Acute Rehab PT Goals Patient Stated Goal: to walk and get pain managed PT Goal Formulation: With patient/family Time For Goal Achievement: 03/18/18 Potential to Achieve Goals: Good Progress towards PT goals: Progressing toward goals    Frequency    Min 3X/week      PT Plan Current plan remains appropriate    Co-evaluation              AM-PAC PT "6 Clicks" Mobility   Outcome Measure  Help needed turning from your back to your side while in a flat bed without using bedrails?: None Help needed moving from lying on your back to sitting on the side of a flat bed without using bedrails?: A Little Help needed moving to and from a bed to a chair (including a wheelchair)?: A Little Help needed standing up from a chair using your arms (e.g., wheelchair or bedside chair)?: A Little Help needed to walk in hospital room?: A Little Help needed climbing 3-5 steps with a railing? : A Lot 6 Click Score: 18    End of Session Equipment Utilized During Treatment: Gait belt Activity Tolerance: Patient limited by pain Patient left: in bed;with call bell/phone within reach;with bed alarm set;with family/visitor present Nurse Communication: Mobility status PT Visit Diagnosis: Unsteadiness on feet (R26.81);Muscle weakness (generalized) (M62.81);Adult, failure to thrive (R62.7)     Time: 1540-1600 PT Time Calculation (min) (ACUTE ONLY): 20 min  Charges:  $Therapeutic Activity: 8-22 mins                    Mabeline Caras, PT, DPT Acute Rehabilitation Services  Pager [phone] Office [place] 03/07/2018, 5:38 PM

## 2018-03-08 ENCOUNTER — Inpatient Hospital Stay (HOSPITAL_COMMUNITY): Payer: Medicare Other

## 2018-03-08 DIAGNOSIS — I82401 Acute embolism and thrombosis of unspecified deep veins of right lower extremity: Secondary | ICD-10-CM

## 2018-03-08 MED ORDER — TRAMADOL HCL 50 MG PO TABS: 100.0000 mg | ORAL_TABLET | Freq: Four times a day (QID) | ORAL | 0 refills | Status: AC

## 2018-03-08 MED ORDER — ENOXAPARIN SODIUM 120 MG/0.8ML ~~LOC~~ SOLN: 1.0000 mg/kg | Freq: Two times a day (BID) | SUBCUTANEOUS | 0 refills | Status: DC

## 2018-03-08 NOTE — Discharge Instructions (Signed)
Deep Vein Thrombosis Deep vein thrombosis (DVT) is a condition in which a blood clot forms in a deep vein, such as a lower leg, thigh, or arm vein. A clot is blood that has thickened into a gel or solid. This condition is dangerous. It can lead to serious and even life-threatening complications if the clot travels to the lungs and causes a blockage (pulmonary embolism). It can also damage veins in the leg. This can result in leg pain, swelling, discoloration, and sores (post-thrombotic syndrome). What are the causes? This condition may be caused by:  A slowdown of blood flow.  Damage to a vein.  A condition that makes blood clot more easily.  What increases the risk? The following factors may make you more likely to develop this condition:  Being overweight.  Being elderly, especially over age 20.  Sitting or lying down for more than four hours.  Lack of physical activity (sedentary lifestyle).  Being pregnant, giving birth, or having recently given birth.  Taking medicines that contain estrogen.  Smoking.  A history of any of the following: ? Blood clots or blood clotting disease. ? Peripheral vascular disease. ? Inflammatory bowel disease. ? Cancer. ? Heart disease. ? Genetic conditions that affect how blood clots. ? Neurological diseases that affect the legs (leg paresis). ? Injury. ? Major or lengthy surgery. ? A central line placed inside a large vein.  What are the signs or symptoms? Symptoms of this condition include:  Swelling, pain, or tenderness in an arm or leg.  Warmth, redness, or discoloration in an arm or leg.  If the clot is in your leg, symptoms may be more noticeable or worse when you stand or walk. Some people do not have any symptoms. How is this diagnosed? This condition is diagnosed with:  A medical history.  A physical exam.  Tests, such as: ? Blood tests. These are done to see how your blood clots. ? Imaging tests. These are done to  check for clots. Tests may include:  Ultrasound.  CT scan.  MRI.  X-ray.  Venogram. For this test, X-rays are taken after a dye is injected into a vein.  How is this treated? Treatment for this condition depends on the cause, your risk for bleeding or developing more clots, and any medical conditions you have. Treatment may include:  Taking blood thinners (also called anticoagulants). These medicines may be taken by mouth, injected under the skin, or injected through an IV tube (catheter). These medicines prevent clots from forming.  Injecting medicine that dissolves blood clots into the affected vein (catheter-directed thrombolysis).  Having surgery. Surgery may be done to: ? Remove the clot. ? Place a filter in a large vein to catch blood clots before they reach the lungs.  Some treatments may be continued for up to six months. Follow these instructions at home: If you are taking an oral blood thinner:  Take the medicine exactly as told by your health care provider. Some blood thinners need to be taken at the same time every day. Do not skip a dose.  Ask your health care provider about what foods and drugs interact with the medicine.  Ask about possible side effects. General instructions  Blood thinners can cause easy bruising and difficulty stopping bleeding. Because of this, if you are taking or were given a blood thinner: ? Hold pressure over cuts for longer than usual. ? Tell your dentist and other health care providers that you are taking blood thinners before  having any procedures that can cause bleeding. ? Avoid contact sports.  Take over-the-counter and prescription medicines only as told by your health care provider.  Return to your normal activities as told by your health care provider. Ask your health care provider what activities are safe for you.  Wear compression stockings if recommended by your health care provider.  Keep all follow-up visits as told by  your health care provider. This is important. How is this prevented? To lower your risk of developing this condition again:  For 30 or more minutes every day, do an activity that: ? Involves moving your arms and legs. ? Increases your heart rate.  When traveling for longer than four hours: ? Exercise your arms and legs every hour. ? Drink plenty of water. ? Avoid drinking alcohol.  Avoid sitting or lying for a long time without moving your legs.  Stay a healthy weight.  If you are a woman who is older than age 81, avoid unnecessary use of medicines that contain estrogen.  Do not use any products that contain nicotine or tobacco, such as cigarettes and e-cigarettes. This is especially important if you take estrogen medicines. If you need help quitting, ask your health care provider.  Contact a health care provider if:  You miss a dose of your blood thinner.  You have nausea, vomiting, or diarrhea that lasts for more than one day.  Your menstrual period is heavier than usual.  You have unusual bruising. Get help right away if:  You have new or increased pain, swelling, or redness in an arm or leg.  You have numbness or tingling in an arm or leg.  You have shortness of breath.  You have chest pain.  You have a rapid or irregular heartbeat.  You feel light-headed or dizzy.  You cough up blood.  There is blood in your vomit, stool, or urine.  You have a serious fall or accident, or you hit your head.  You have a severe headache or confusion.  You have a cut that will not stop bleeding. These symptoms may represent a serious problem that is an emergency. Do not wait to see if the symptoms will go away. Get medical help right away. Call your local emergency services (911 in the [place] Do not drive yourself to the hospital. Summary  DVT is a condition in which a blood clot forms in a deep vein, such as a lower leg, thigh, or arm vein.  Symptoms can include swelling,  warmth, pain, and redness in your leg or arm.  Treatment may include taking blood thinners, injecting medicine that dissolves blood clots,wearing compression stockings, or surgery.  If you are prescribed blood thinners, take them exactly as told. This information is not intended to replace advice given to you by your health care provider. Make sure you discuss any questions you have with your health care provider. Document Released: 03/12/2005 Document Revised: 04/14/2016 Document Reviewed: 04/14/2016 Elsevier Interactive Patient Education  2018 [employer]. Pulmonary Embolism A pulmonary embolism (PE) is a sudden blockage or decrease of blood flow in one lung or both lungs. Most blockages come from a blood clot that forms in a lower leg, thigh, or arm vein (deep vein thrombosis, DVT) and travels to the lungs. A clot is blood that has thickened into a gel or solid. PE is a dangerous and life-threatening condition that needs to be treated right away. What are the causes? This condition is usually caused by a blood clot  that forms in a vein and moves to the lungs. In rare cases, it may be caused by air, fat, part of a tumor, or other tissue that moves through the veins and into the lungs. What increases the risk? The following factors may make you more likely to develop this condition:  Having DVT or a history of DVT.  Being older than age 95.  Personal or family history of blood clots or blood clotting disease.  Major or lengthy surgery.  Orthopedic surgery, especially hip or knee replacement.  Traumatic injury, such as breaking a hip or leg.  Spinal cord injury.  Stroke.  Taking medicines that contain estrogen. These include birth control pills and hormone replacement therapy.  Long-term (chronic) lung or heart disease.  Cancer and chemotherapy.  Having a central venous catheter.  Pregnancy and the period after delivery.  What are the signs or symptoms? Symptoms of this  condition usually start suddenly and include:  Shortness of breath while active or at rest.  Coughing or coughing up blood or blood-tinged mucus.  Chest pain that is often worse with deep breaths.  Rapid or irregular heartbeat.  Feeling light-headed or dizzy.  Fainting.  Feeling anxious.  Sweating.  Pain and swelling in a leg. This is a symptom of DVT, which can lead to PE.  How is this diagnosed? This condition may be diagnosed based on:  Your medical history.  A physical exam.  Blood tests to check blood oxygen level and how well your blood clots, and a D-dimer blood test, which checks your blood for a substance that is released when a blood clot breaks apart.  CT pulmonary angiogram. This test checks blood flow in and around your lungs.  Ventilation-perfusion scan, also called a lung VQ scan. This test measures air flow and blood flow to the lungs.  Ultrasound of the legs to look for blood clots.  How is this treated? Treatment for this conditions depends on many factors, such as the cause of your PE, your risk for bleeding or developing more clots, and other medical conditions you have. Treatment aims to remove, dissolve, or stop blood clots from forming or growing larger. Treatment may include:  Blood thinning medicines (anticoagulants) to stop clots from forming or growing. These medicines may be given as a pill, as an injection, or through an IV tube (infusion).  Medicines that dissolve clots (thrombolytics).  A procedure in which a flexible tube is used to remove a blood clot (embolectomy) or deliver medicine to destroy it (catheter-directed thrombolysis).  A procedure in which a filter is inserted into a large vein that carries blood to the heart (inferior vena cava). This filter (vena cava filter) catches blood clots before they reach the lungs.  Surgery to remove the clot (surgical embolectomy). This is rare.  You may need a combination of immediate,  long-term (up to 3 months after diagnosis), and extended (more than 3 months after diagnosis) treatments. Your treatment may continue for several months (maintenance therapy). You and your health care provider will work together to choose the treatment program that is best for you. Follow these instructions at home: If you are taking an anticoagulant medicine:  Take the medicine every day at the same time each day.  Understand what foods and drugs interact with your medicine.  Understand the side effects of this medicine, including excessive bruising or bleeding. Ask your health care provider or pharmacist about other side effects. General instructions  Take over-the-counter and prescription medicines  only as told by your health care provider.  Anticoagulant medicines may cause side effects, including easy bruising and difficulty stopping bleeding. If you are prescribed an anticoagulant: ? Hold pressure over cuts for longer than usual. ? Tell your dentist and other health care providers that you are taking anticoagulants before you have any procedure that may cause bleeding. ? Avoid contact sports. ? Be extra careful when handling sharp objects. ? Use a soft toothbrush. Floss with waxed dental floss. ? Shave with an Copy.  Wear a medical alert bracelet or carry a medical alert card that says you have had a PE.  Ask your health care provider when you may return to your normal activities.  Talk with your health care provider about any travel plans. It is important to make sure that you are still able to take your medicine while on trips.  Keep all follow-up visits as told by your health care provider. This is important. How is this prevented? Take these actions to lower your risk of developing another PE:  Exercise regularly. Take frequent walks. For at least 30 minutes every day, engage in: ? Activity that involves moving your arms and legs. ? Activity that encourages good  blood flow through your body by increasing your heart rate.  While traveling, drink plenty of water and avoid drinking alcohol. Ask your health care provider if you should wear below-the-knee compression stockings.  Avoid sitting or lying in bed for long periods of time without moving your legs. Exercise your arms and legs every hour during long-distance travel (over 4 hours).  If you are hospitalized or have surgery, ask your health care provider about your risks and what treatments can help prevent blood clots.  Maintain a healthy weight. Ask your health care provider what weight is healthy for you.  If you are a woman who is over age 68, avoid unnecessary use of medicines that contain estrogen, including birth control pills.  Do not use any products that contain nicotine or tobacco, such as cigarettes and e-cigarettes. This is especially important if you take estrogen medicines. If you need help quitting, ask your health care provider.  See your health care provider for regular checkups. This may include blood tests and ultrasound testing on your legs to check for new blood clots.  Contact a health care provider if:  You missed a dose of your blood thinner medicine. Get help right away if:  You have new or increased pain, swelling, warmth, or redness in an arm or leg.  You have numbness or tingling in an arm or leg.  You have shortness of breath while active or at rest.  You have chest pain.  You have a rapid or irregular heartbeat.  You feel light-headed or dizzy.  You cough up blood.  You have blood in your vomit, stool, or urine.  You have a fever.  You have abdomen (abdominal) pain.  You have a severe fall or head injury.  You have a severe headache.  You have vision changes.  You cannot move your arms or legs.  You are confused or have memory loss.  You are bleeding for 10 minutes or more, even with strong pressure on the wound. These symptoms may represent  a serious problem that is an emergency. Do not wait to see if the symptoms will go away. Get medical help right away. Call your local emergency services (911 in the [place] Do not drive yourself to the hospital. Summary  A  pulmonary embolism (PE) is a sudden blockage or decrease of blood flow in one lung or both lungs. PE is a dangerous and life-threatening condition that needs to be treated right away.  Having deep vein thrombosis (DVT) or a history of DVT is the most common risk factor for PE.  Treatments for this condition usually include medicines to thin your blood (anticoagulants) or medicines to break apart blood clots (thrombolytics).  If you are prescribed blood thinners, it is important to take the medicine every single day at the same time each day.  If you have signs of PE or DVT, call your local emergency services (911 in the [place] This information is not intended to replace advice given to you by your health care provider. Make sure you discuss any questions you have with your health care provider. Document Released: 03/09/2000 Document Revised: 04/14/2016 Document Reviewed: 04/14/2016 Elsevier Interactive Patient Education  2018 [employer].

## 2018-03-08 NOTE — Progress Notes (Signed)
ANTICOAGULATION CONSULT NOTE - Follow Up Consult  Pharmacy Consult for Lovenox Indication: DVT/PE  Allergies  Allergen Reactions  . Lisinopril-Hydrochlorothiazide Itching  . Adhesive [Tape] Itching and Other (See Comments)    Redness  . Fentanyl Other (See Comments)    GI upset and drowsiness. Only to [facility]  . Gabapentin Other (See Comments)    Auditory hallucinations  . Hctz [Hydrochlorothiazide] Itching  . Latex Itching  . Lisinopril Itching  . Losartan Potassium Other (See Comments)    Makes her feel "bad"   . Other Other (See Comments)    Patient refuses blood for religious reasons (per her notes)  . Oxycodone     GI upset, drowsy  . Tums [Calcium Carbonate Antacid] Hives    FRUIT-FLAVORED ONES    Patient Measurements: Height: 5\' 7"  (170.2 cm) Weight: 242 lb 4.8 oz (109.9 kg) IBW/kg (Calculated) : 61.6  Vital Signs: Temp: 98.7 F (37.1 C) (12/14 0834) Temp Source: Oral (12/14 0834) BP: 127/58 (12/14 0834) Pulse Rate: 93 (12/14 0834)  Labs: Recent Labs    03/06/18 0331  HGB 4.4*  HCT 15.8*  PLT 78*    Estimated Creatinine Clearance: 96.7 mL/min (by C-G formula based on SCr of 0.72 mg/dL).   Assessment: 25 yoF with acute PE/DVT to be discharged today on Lovenox given hx of cancer. Hgb low and heme/onc aware. Pt s/p IVC filter on 12/7.  Goal of Therapy:  Anti-Xa level 0.6-1 units/ml 4hrs after LMWH dose given Monitor platelets by anticoagulation protocol: Yes   Plan:  Lovenox 110mg  SQ q12h. Discharge today  Arrie Senate, PharmD, BCPS Clinical Pharmacist [phone] Please check AMION for all [place] numbers 03/08/2018

## 2018-03-08 NOTE — Clinical Social Work Note (Signed)
Per RN pt will need PTAR home.  CSW arranged ambulance transport via [place] to [place]  [id number].  Clinical Social Worker will sign off for now as social work intervention is no longer needed. Please consult [place] again if new need arises.  [place], [place]

## 2018-03-08 NOTE — Discharge Summary (Signed)
Physician Discharge Summary  Michaela Little EYC:[id number] DOB: 10/23/1960 DOA: 02/27/2018  PCP: Clent Demark, PA-C  Admit date: 02/27/2018 Discharge date: 03/08/2018  Time spent: 45 minutes  Recommendations for Outpatient Follow-up:  Patient will be discharged to home with home health physical therapy and nursing.  Patient will need to follow up with primary care provider within one week of discharge.  Follow up with Dr. Burr Medico, oncology. Follow up with interventional radiology. Patient should continue medications as prescribed.  Patient should follow a regular diet.   Discharge Diagnoses:  Dyspnea secondary to New pulmonary embolism in the setting of hypercoagulable state secondary to cancer Right hip pain Fever Right upper arm swelling Headache Left breast cancer  Severe anemia/thrombocytopenia COPD Goals of care Deconditioning  Discharge Condition: Stable  Diet recommendation: regular  Filed Weights   03/06/18 0442 03/07/18 0525 03/08/18 0338  Weight: 113.8 kg 112.3 kg 109.9 kg    History of present illness:  on 02/27/2018 by Dr. Merton Border CrystalScottis a57 y.o.female,with past medical history significant for metastatic breast cancer, right leg DVT diagnosed at [facility] 1 month ago, hyperlipidemia and obstructive sleep apnea presenting today for evaluation of few days history of chest pain shortness of breath. The patient was started on Lovenox for right leg DVT which had to be stopped due to severe anemia. At that time goals of care were discussed with the patient at [facility] due to her grave prognosis and refusal of blood transfusion due to religious beliefs (Jehovah's Witness). Patient was home and she presented today to the ER where she was diagnosed with pulmonary embolism and work-up in the emergency room showed also that her hemoglobin is 5.8 and platelet count is 71,000. I spent some time discussing the situation with the patient .It was quite  complex and I got Dr. Loanne Drilling from pulmonary critical care to help me explain the situation to her. On one hand she has a pulmonary embolus that might kill her and the treatment might also be lethal due to her low platelet count and low hemoglobin. Patient refused blood products transfusion. Had a gravity of her situation was stressed especially that she has end-stage metastatic cancer. Patient wanted to be started on heparin and the IVC filter to be placed in view of her recently diagnosed right leg DVT. We agreed to go ahead with that  Hospital Course:  Dyspnea secondary to New pulmonary embolism in the setting of hypercoagulable state secondary to cancer -Was initially placed on IV heparin and transition to Lovenox -Interventional radiology consulted for IVC filter placement -CTA showed moderate sized pulmonary emboli on the right without evidence of right heart strain.  Progressive bony metastatic disease. -Lower Extremity Doppler acute DVT involving right common femoral, femoral, proximal profunda, popliteal, posterior tibial veins.  No evidence of DVT left lower extremity. -Echocardiogram showed an EF of 65 to 14%, grade 1 diastolic dysfunction -MRI of the right hip showed probable acute thrombosis of the right common femoral vein, external iliac and adjacent superficial veins in the proximal right thigh.  Diffuse abnormal marrow signal, could represent red marrow reactivation or diffuse metastatic disease. -Interventional radiology consulted and appreciated for possible prevention of right lower extremity DVTs however given her thrombocytopenia and anemia doubt there will be much to offer -Patient decided against the procedure but would like more info regarding the procedure -dicussed with IR, patient will be scheduled for outpatient follow up with IR and they will try to find information  -Discussed lovenox injections,  notified by [facility] that PA was not needed for generic   Right hip  pain -Orthopedics consulted and appreciated, recommended MRI -MRI as above -Orthopedics has signed off -Continue conservative management  Fever, low grade -Possibly secondary to the above  Right upper arm swelling -Secondary to infiltrated IV -Continue to follow clinically and offer supportive care  Headache -Currently no focal deficits -Continue Tylenol as needed -Patient also had episode of numbness which is resolved  Left breast cancer , metastatic  -status post mastectomy, SRT, tamoxifen -Hematology/oncology, Dr. Burr Medico consulted and appreciated -Currently not on chemotherapy- and has declined therapy  Severe anemia/thrombocytopenia -Likely secondary to metastatic cancer -Patient is a Jehovah's Witness -Hemoglobin currently 4.4 -Continue epo every 2 weeks -Patient wishes to have EPO weekly.  Discussed this with Dr. Burr Medico who feels that the risk of using weekly can increase patient's risk of blood clots as well as tumor growth. Also recommended not checking CBC everyday- minimize blood draws  COPD -Stable  Goals of care -Patient has a very guarded prognosis and will need to discuss further goals of care with her oncologist as well as palliative care  Deconditioning -PT consulted recommending home health  Consultants Interventional radiology Hematology oncology Orthopedic surgery  Procedures  Lower extremity Doppler Echocardiogram  Discharge Exam: Vitals:   03/08/18 0338 03/08/18 0834  BP: (!) 152/71 (!) 127/58  Pulse:  93  Resp: 12   Temp:  98.7 F (37.1 C)  SpO2: 95% 96%     General: Well developed, well nourished, NAD, appears stated age  72: NCAT, mucous membranes moist.  Cardiovascular: S1 S2 auscultated,RRR, no murmur  Respiratory: Clear to auscultation bilaterally with equal chest rise  Abdomen: Soft, nontender, nondistended, + bowel sounds  Extremities: warm dry without cyanosis clubbing or edema  Neuro: AAOx3,  nonfocal  Psych: Normal affect and demeanor   Discharge Instructions Discharge Instructions    Discharge instructions   Complete by:  As directed    Patient will be discharged to home with home health physical therapy and nursing.  Patient will need to follow up with primary care provider within one week of discharge.  Follow up with Dr. Burr Medico, oncology. Follow up with interventional radiology. Patient should continue medications as prescribed.  Patient should follow a regular diet.     Allergies as of 03/08/2018      Reactions   Lisinopril-hydrochlorothiazide Itching   Adhesive [tape] Itching, Other (See Comments)   Redness   Fentanyl Other (See Comments)   GI upset and drowsiness. Only to [facility]   Gabapentin Other (See Comments)   Auditory hallucinations   Hctz [hydrochlorothiazide] Itching   Latex Itching   Lisinopril Itching   Losartan Potassium Other (See Comments)   Makes her feel "bad"   Other Other (See Comments)   Patient refuses blood for religious reasons (per her notes)   Oxycodone    GI upset, drowsy   Tums [calcium Carbonate Antacid] Hives   FRUIT-FLAVORED ONES      Medication List    STOP taking these medications   carbamide peroxide 6.5 % OTIC solution Commonly known as:  DEBROX   chlorpheniramine-HYDROcodone 10-8 MG/5ML Suer Commonly known as:  TUSSIONEX PENNKINETIC ER   diphenhydrAMINE 25 mg capsule Commonly known as:  BENADRYL   furosemide 20 MG tablet Commonly known as:  LASIX     TAKE these medications   cetirizine 10 MG tablet Commonly known as:  ZYRTEC Take 10 mg by mouth daily.   cyanocobalamin 1000 MCG  tablet Take 1 tablet (1,000 mcg total) by mouth daily.   enoxaparin 120 MG/0.8ML injection Commonly known as:  LOVENOX Inject 0.73 mLs (110 mg total) into the skin every 12 (twelve) hours.   exemestane 25 MG tablet Commonly known as:  AROMASIN Take 1 tablet (25 mg total) by mouth daily after breakfast.   fluticasone  furoate-vilanterol 100-25 MCG/INH Aepb Commonly known as:  BREO ELLIPTA Inhale 1 puff into the lungs daily.   folic acid 1 MG tablet Commonly known as:  FOLVITE Take 1 tablet (1 mg total) by mouth daily.   HYDROcodone-homatropine 5-1.5 MG/5ML syrup Commonly known as:  HYCODAN Take 5 mLs by mouth every 6 (six) hours as needed for cough.   ipratropium-albuterol 0.5-2.5 (3) MG/3ML Soln Commonly known as:  DUONEB INHALE THREE MLS VIA NEBULIZER EVERY 6 HOURS AS NEEDED What changed:  See the new instructions.   montelukast 10 MG tablet Commonly known as:  SINGULAIR Take 1 tablet (10 mg total) by mouth at bedtime.   pantoprazole 40 MG tablet Commonly known as:  PROTONIX Take 1 tablet (40 mg total) by mouth daily. Take 30-60 min before first meal of the day   promethazine 25 MG tablet Commonly known as:  PHENERGAN Take 1 tablet (25 mg total) by mouth every 6 (six) hours as needed for nausea or vomiting.   traMADol 50 MG tablet Commonly known as:  ULTRAM Take 2 tablets (100 mg total) by mouth every 6 (six) hours.      Allergies  Allergen Reactions  . Lisinopril-Hydrochlorothiazide Itching  . Adhesive [Tape] Itching and Other (See Comments)    Redness  . Fentanyl Other (See Comments)    GI upset and drowsiness. Only to [facility]  . Gabapentin Other (See Comments)    Auditory hallucinations  . Hctz [Hydrochlorothiazide] Itching  . Latex Itching  . Lisinopril Itching  . Losartan Potassium Other (See Comments)    Makes her feel "bad"   . Other Other (See Comments)    Patient refuses blood for religious reasons (per her notes)  . Oxycodone     GI upset, drowsy  . Tums [Calcium Carbonate Antacid] Hives    FRUIT-FLAVORED ONES   Follow-up Information    Care, [facility] Follow up.   Why:  Registered Nurse Contact information: [place] [address] [place], [place] Follow up.   Why:   Please call this number to set up a meeting to tour the facility.  Contact information: [place] [address] [phone]        Clent Demark, PA-C. Schedule an appointment as soon as possible for a visit in 1 week(s).   Specialty:  Physician Assistant Why:  Hospital follow up Contact information: [place] [address] [phone]        [name], MD. Schedule an appointment as soon as possible for a visit in 1 week(s).   Specialties:  Hematology, Oncology Why:  Hospital follow up Contact information: [place] [address] [phone]            The results of significant diagnostics from this hospitalization (including imaging, microbiology, ancillary and laboratory) are listed below for reference.    Significant Diagnostic Studies: Dg Chest 2 View  Result Date: 02/27/2018 CLINICAL DATA:  Right sided CP/flank pain with SOB x 3 days, HTN, past smoker - quit '98,  hx of right leg clots EXAM: CHEST - 2 VIEW COMPARISON:  01/01/2018 FINDINGS: Heart size is normal. The lungs are free of focal consolidations and pleural effusions. No pulmonary edema. Surgical clips are present in the LEFT axillary region. Degenerative changes are seen in thoracic spine. IMPRESSION: No evidence for acute cardiopulmonary abnormality. Electronically Signed   By: Nolon Nations M.D.   On: 02/27/2018 13:43   Ct Angio Chest Pe W And/or Wo Contrast  Result Date: 02/27/2018 CLINICAL DATA:  Shortness of breath for the past 2 days. History of breast cancer with possible bone metastases. EXAM: CT ANGIOGRAPHY CHEST WITH CONTRAST TECHNIQUE: Multidetector CT imaging of the chest was performed using the standard protocol during bolus administration of intravenous contrast. Multiplanar CT image reconstructions and MIPs were obtained to evaluate the vascular anatomy. CONTRAST:  88mL ISOVUE-370 IOPAMIDOL (ISOVUE-370) INJECTION 76% COMPARISON:   Chest radiographs obtained earlier today. PET-CT dated 01/15/2018. Chest CTA and abdomen and pelvis CT dated 08/27/2017. FINDINGS: Cardiovascular: Small pericardial effusion with a maximum thickness of 7 mm. Moderate-sized right pulmonary arterial filling defects. These are in the right middle lobe and right lower lobe branches. The right ventricular to left ventricular ratio is normal at 0.84. Mediastinum/Nodes: Mildly enlarged thyroid gland with an incidental 7 mm right lobe nodule. No enlarged lymph nodes. Lungs/Pleura: The interstitial markings in both lower lobes remain mildly prominent. No lung nodules or pleural fluid. Upper Abdomen: Cholecystectomy clips. Musculoskeletal: Or heterogeneous appearance of the bones with patchy sclerosis and lucency throughout the bony skeleton. The areas of lucency are larger and more confluent than previously demonstrated in multiple thoracic vertebrae and in the upper sternum with some cortical breakthrough posteriorly in the upper sternum with a small amount of associated retrosternal soft tissue density. The new, confluent areas of lucency are most pronounced in the T7 vertebral body with an interval minimal compression deformity of the vertebral body with stable mild compression deformity of the adjacent T8 vertebral body. Postmastectomy changes are again noted on the left. Review of the MIP images confirms the above findings. IMPRESSION: 1. Moderate sized pulmonary emboli on the right without evidence of right heart strain. 2. Progressive bony metastatic disease as described above. 3. Minimal pericardial effusion. Critical Value/emergent results were called by telephone at the time of interpretation on 02/27/2018 at 2:50 pm to Dr. Veryl Speak , who verbally acknowledged these results. Electronically Signed   By: Claudie Revering M.D.   On: 02/27/2018 14:54   Mr Pelvis Wo Contrast  Result Date: 03/05/2018 CLINICAL DATA:  Right hip pain and right groin pain. EXAM: MRI  PELVIS WITHOUT CONTRAST TECHNIQUE: Multiplanar multisequence MR imaging of the pelvis was performed. No intravenous contrast was administered. COMPARISON:  PET-CT dated 01/15/2018 FINDINGS: Urinary Tract:  No abnormality visualized. Bowel:  Unremarkable visualized pelvic bowel loops. Vascular/Lymphatic: Probable acute thrombosis of the right femoral vein and external iliac vein and superficial veins in the anterior aspect of the proximal right thigh. No visible adenopathy. Reproductive:  Hysterectomy.  No adnexal masses. Musculoskeletal: Diffuse patchy abnormal signal from all of the visualized bones consistent with either reactivated red marrow or less likely diffuse metastatic disease to the marrow. Extensive subcutaneous and muscle edema in the proximal right thigh felt to be due to the venous thrombosis. IMPRESSION: Probable acute thrombosis of the right femoral vein and right external iliac vein and superficial veins of the anterior aspect of the right thigh with secondary muscle and subcutaneous edema. Electronically Signed   By: Lorriane Shire  M.D.   On: 03/05/2018 15:23   Ir Ivc [place] Mole Plmt / S&i /img Guid/mod Sed  Result Date: 03/01/2018 CLINICAL DATA:  DVT, acute pulmonary embolism, severe anemia. IVC filter placement has been recommended as the patient cannot receive blood transfusion for religious reasons. EXAM: 1. ULTRASOUND GUIDANCE FOR VASCULAR ACCESS OF THE RIGHT INTERNAL JUGULAR VEIN. 2. IVC VENOGRAM. 3. PERCUTANEOUS IVC FILTER PLACEMENT. ANESTHESIA/SEDATION: 2.0 mg IV Versed; 100 mcg IV Fentanyl. Total Moderate Sedation Time: 11 minutes. The patient's level of consciousness and physiologic status were continuously monitored during the procedure by Radiology nursing. A time-out was performed prior to initiating the procedure. CONTRAST:  30 mL Isovue-300 FLUOROSCOPY TIME:  1 minutes and 30 seconds.  100.4 mGy. PROCEDURE: The procedure, risks, benefits, and alternatives were explained to the  patient. Questions regarding the procedure were encouraged and answered. The patient understands and consents to the procedure. A time-out was performed prior to initiating the procedure. The right neck was prepped with chlorhexidine in a sterile fashion, and a sterile drape was applied covering the operative field. A sterile gown and sterile gloves were used for the procedure. Local anesthesia was provided with 1% Lidocaine. Ultrasound was utilized to confirm patency of the right internal jugular vein. Under direct ultrasound guidance, a 21 gauge needle was advanced into the right internal jugular vein with ultrasound image documentation performed. After securing access with a micropuncture dilator, a guidewire was advanced into the inferior vena cava. A deployment sheath was advanced over the guidewire. This was utilized to perform IVC venography. The deployment sheath was further positioned in an appropriate location for filter deployment. A Bard Denali IVC filter was then advanced in the sheath. This was then fully deployed in the infrarenal IVC. Final filter position was confirmed with a fluoroscopic spot image. After the procedure the sheath was removed and hemostasis obtained with manual compression. COMPLICATIONS: None. FINDINGS: IVC venography demonstrates a normal caliber IVC with no evidence of thrombus. Renal veins are identified bilaterally. The IVC filter was successfully positioned below the level of the renal veins and is appropriately oriented. This IVC filter has both permanent and retrievable indications. IMPRESSION: Placement of percutaneous IVC filter in infrarenal IVC. IVC venogram shows no evidence of IVC thrombus and normal caliber of the inferior vena cava. This filter does have both permanent and retrievable indications. PLAN: This IVC filter is potentially retrievable. The patient will be assessed for filter retrieval by Interventional Radiology in approximately 8-12 weeks. Further  recommendations regarding filter retrieval, continued surveillance or declaration of device permanence, will be made at that time. Electronically Signed   By: Aletta Edouard M.D.   On: 03/01/2018 11:31   Mr Hip Right Wo Contrast  Result Date: 03/05/2018 CLINICAL DATA:  Right hip pain. Right groin pain. History of metastatic breast cancer. EXAM: MR OF THE RIGHT HIP WITHOUT CONTRAST TECHNIQUE: Multiplanar, multisequence MR imaging was performed. No intravenous contrast was administered. COMPARISON:  Radiographs dated 03/03/2018 and PET-CT dated 01/15/2018 and CT scan of the abdomen and pelvis dated 08/27/2017 FINDINGS: Other findings Miscellaneous: There's extensive thrombosis of the right external iliac and right common femoral vein as well as superficial veins in the proximal right thigh. There's prominent subcutaneous and muscle edema in right thigh primarily in the anterior and lateral aspects. Bones: There's diffuse abnormal signal from all of the bones consistent with either diffuse red marrow reactivation or diffuse metastatic disease to the marrow. There's no bone destruction. No fractures. Articular cartilage and labrum Articular cartilage:  Normal. Labrum:  Normal. Joint or bursal effusion Joint effusion:  None Bursae: Normal. Muscles and tendons Muscles and tendons: Prominent edema in the muscles of the anterior and lateral aspects of the right proximal thigh and hip with adjacent subcutaneous edema. This is felt to be due to acute venous thrombosis as described above. IMPRESSION: 1. Probable acute thrombosis of the right common femoral vein and right external iliac vein as well as adjacent superficial veins in the proximal right thigh. 2. Diffuse abnormal marrow signal as described on the prior PET-CT scan. This could represent red marrow reactivation or diffuse metastatic disease. 3. No acute bone abnormality. Critical Value/emergent results were called by telephone at the time of interpretation on  03/05/2018 at 2:30 pm to Dr. Sabra Heck, who verbally acknowledged these results. Electronically Signed   By: Lorriane Shire M.D.   On: 03/05/2018 14:36   Dg Hip Unilat With Pelvis 2-3 Views Right  Result Date: 03/03/2018 CLINICAL DATA:  Chronic right hip pain EXAM: DG HIP (WITH OR WITHOUT PELVIS) 2-3V RIGHT COMPARISON:  04/17/2015 FINDINGS: SI joints are non widened. Pubic symphysis and rami are intact. No fracture or malalignment. Mild to moderate degenerative change of the right hip. 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 No acute osseous abnormality. Electronically Signed   By: Donavan Foil M.D.   On: 03/03/2018 21:00   Vas [place] Lower Extremity Venous (dvt)  Result Date: 03/02/2018  Lower Venous Study Indications: Pulmonary embolism.  Risk Factors: Chemotherapy confirmed PE Cancer Breast Cancer. Limitations: Body habitus. Comparison Study: Lower extremities venous duplex study on 06/14/2017 with                   negative finding;                   CTA on 02/27/2018 with confirmed PE. Performing Technologist: Lorina Rabon  Examination Guidelines: A complete evaluation includes B-mode imaging, spectral Doppler, color Doppler, and power Doppler as needed of all accessible portions of each vessel. Bilateral testing is considered an integral part of a complete examination. Limited examinations for reoccurring indications may be performed as noted.  Right Venous Findings: +---------+---------------+---------+-----------+----------+------------------+          CompressibilityPhasicitySpontaneityPropertiesSummary            +---------+---------------+---------+-----------+----------+------------------+ CFV      None                    No                   Acute              +---------+---------------+---------+-----------+----------+------------------+ SFJ      None                                         Acute               +---------+---------------+---------+-----------+----------+------------------+ FV Prox  None                    No                   Acute              +---------+---------------+---------+-----------+----------+------------------+ FV Mid   None  Acute              +---------+---------------+---------+-----------+----------+------------------+ FV DistalNone                                         Acute              +---------+---------------+---------+-----------+----------+------------------+ PFV      None                                         Acute              +---------+---------------+---------+-----------+----------+------------------+ POP      None                    No                   Acute              +---------+---------------+---------+-----------+----------+------------------+ PTV                                                   poor visualization +---------+---------------+---------+-----------+----------+------------------+ PERO                                                  poor visualization +---------+---------------+---------+-----------+----------+------------------+ GSV      None                                         Acute              +---------+---------------+---------+-----------+----------+------------------+ Right external iliac vein not well visualized.  Left Venous Findings: +---------+---------------+---------+-----------+----------+------------------+          CompressibilityPhasicitySpontaneityPropertiesSummary            +---------+---------------+---------+-----------+----------+------------------+ CFV      Full           Yes      Yes                                     +---------+---------------+---------+-----------+----------+------------------+ SFJ      Full                                                             +---------+---------------+---------+-----------+----------+------------------+ FV Prox  Full                                                            +---------+---------------+---------+-----------+----------+------------------+ FV Mid   Full                                                            +---------+---------------+---------+-----------+----------+------------------+  FV DistalFull                                                            +---------+---------------+---------+-----------+----------+------------------+ PFV      Full                                                            +---------+---------------+---------+-----------+----------+------------------+ POP      Full           Yes      Yes                                     +---------+---------------+---------+-----------+----------+------------------+ PTV                                                   poor visualization +---------+---------------+---------+-----------+----------+------------------+ PERO                                                  poor visualization +---------+---------------+---------+-----------+----------+------------------+    Summary: Right: Findings consistent with acute deep vein thrombosis involving the right common femoral vein, right femoral vein, right proximal profunda vein, right popliteal vein, and right posterior tibial vein. Findings consistent with acute superficial vein thrombosis involving the right great saphenous vein. No cystic structure found in the popliteal fossa. Left: There is no evidence of deep vein thrombosis in the lower extremity. However, portions of this examination were limited- see technologist comments above. No cystic structure found in the popliteal fossa. Calf veins unable to be fully examed due to poor visibility.  *See table(s) above for measurements and observations. Electronically signed by Harold Barban MD on 03/02/2018  at 8:24:29 AM.    Final     Microbiology: Recent Results (from the past 240 hour(s))  MRSA PCR Screening     Status: None   Collection Time: 02/27/18 10:11 PM  Result Value Ref Range Status   MRSA by PCR NEGATIVE NEGATIVE Final    Comment:        The GeneXpert MRSA Assay (FDA approved for NASAL specimens only), is one component of a comprehensive MRSA colonization surveillance program. It is not intended to diagnose MRSA infection nor to guide or monitor treatment for MRSA infections. Performed at [facility], [place] [address], [place], [place] [address]      Labs: Basic Metabolic Panel: Recent Labs  Lab 03/03/18 0602 03/05/18 0415  NA 137 137  K 3.9 4.0  CL 103 102  CO2 22 25  GLUCOSE 118* 106*  BUN 11 11  CREATININE 0.78 0.72  CALCIUM 9.8 9.8  PHOS 4.9*  --    Liver Function Tests: Recent Labs  Lab 03/03/18 0602  ALBUMIN 3.1*   No results for input(s): LIPASE, AMYLASE in  the last 168 hours. No results for input(s): AMMONIA in the last 168 hours. CBC: Recent Labs  Lab 03/02/18 0613  03/03/18 0602 03/03/18 1410 03/04/18 0419 03/05/18 0415 03/06/18 0331  WBC 4.9  --  3.8*  --  4.6 4.1 5.5  NEUTROABS 2.1  --   --   --   --   --   --   HGB 4.9*   < > 4.5* 4.5* 4.5* 4.4* 4.4*  HCT 17.0*  --  15.7*  --  15.4* 15.2* 15.8*  MCV 96.6  --  96.3  --  95.1 98.1 98.8  PLT 80*  --  75*  --  70* 71* 78*   < > = values in this interval not displayed.   Cardiac Enzymes: No results for input(s): CKTOTAL, CKMB, CKMBINDEX, TROPONINI in the last 168 hours. BNP: BNP (last 3 results) Recent Labs    09/25/17 1420 01/01/18 1838 02/27/18 1237  BNP 20.4 25.8 15.6    ProBNP (last 3 results) No results for input(s): PROBNP in the last 8760 hours.  CBG: No results for input(s): GLUCAP in the last 168 hours.     Signed:  Cristal Ford  [facility] 03/08/2018, 10:20 AM

## 2018-03-08 NOTE — Progress Notes (Signed)
Discharge instructions have been given to patient with family present using teach back. All questions have been answered and packet given to patient. Patient has requested to have an ambulance ride home due to physical limitations and she is waiting on that. Family is gathering belongings to be taken home.

## 2018-03-10 ENCOUNTER — Inpatient Hospital Stay: Payer: Medicare Other

## 2018-03-10 ENCOUNTER — Other Ambulatory Visit (INDEPENDENT_AMBULATORY_CARE_PROVIDER_SITE_OTHER): Payer: Self-pay | Admitting: Physician Assistant

## 2018-03-10 VITALS — BP 141/82 | HR 91 | Temp 99.0°F | Resp 17

## 2018-03-10 DIAGNOSIS — D649 Anemia, unspecified: Secondary | ICD-10-CM | POA: Diagnosis present

## 2018-03-10 DIAGNOSIS — C50912 Malignant neoplasm of unspecified site of left female breast: Secondary | ICD-10-CM

## 2018-03-10 DIAGNOSIS — R1011 Right upper quadrant pain: Secondary | ICD-10-CM | POA: Diagnosis not present

## 2018-03-10 DIAGNOSIS — C7951 Secondary malignant neoplasm of bone: Principal | ICD-10-CM

## 2018-03-10 DIAGNOSIS — R0781 Pleurodynia: Secondary | ICD-10-CM | POA: Diagnosis not present

## 2018-03-10 DIAGNOSIS — C50812 Malignant neoplasm of overlapping sites of left female breast: Secondary | ICD-10-CM | POA: Diagnosis not present

## 2018-03-10 DIAGNOSIS — J4530 Mild persistent asthma, uncomplicated: Secondary | ICD-10-CM

## 2018-03-10 DIAGNOSIS — Z170 Estrogen receptor positive status [ER+]: Secondary | ICD-10-CM

## 2018-03-10 DIAGNOSIS — R600 Localized edema: Secondary | ICD-10-CM

## 2018-03-10 DIAGNOSIS — M545 Low back pain: Secondary | ICD-10-CM | POA: Diagnosis not present

## 2018-03-10 DIAGNOSIS — I82401 Acute embolism and thrombosis of unspecified deep veins of right lower extremity: Secondary | ICD-10-CM | POA: Diagnosis not present

## 2018-03-10 LAB — CBC WITH DIFFERENTIAL/PLATELET
Abs Immature Granulocytes: 0.22 10*3/uL — ABNORMAL HIGH (ref 0.00–0.07)
Basophils Absolute: 0.1 10*3/uL (ref 0.0–0.1)
Basophils Relative: 1 %
Eosinophils Absolute: 0.1 10*3/uL (ref 0.0–0.5)
Eosinophils Relative: 2 %
HCT: 16.8 % — ABNORMAL LOW (ref 36.0–46.0)
Hemoglobin: 4.7 g/dL — CL (ref 12.0–15.0)
Immature Granulocytes: 5 %
LYMPHS PCT: 35 %
Lymphs Abs: 1.6 10*3/uL (ref 0.7–4.0)
MCH: 28 pg (ref 26.0–34.0)
MCHC: 28 g/dL — ABNORMAL LOW (ref 30.0–36.0)
MCV: 100 fL (ref 80.0–100.0)
Monocytes Absolute: 0.4 10*3/uL (ref 0.1–1.0)
Monocytes Relative: 8 %
Neutro Abs: 2.3 10*3/uL (ref 1.7–7.7)
Neutrophils Relative %: 49 %
Platelets: 85 10*3/uL — ABNORMAL LOW (ref 150–400)
RBC: 1.68 MIL/uL — ABNORMAL LOW (ref 3.87–5.11)
RDW: 19.9 % — ABNORMAL HIGH (ref 11.5–15.5)
WBC: 4.7 10*3/uL (ref 4.0–10.5)
nRBC: 0 % (ref 0.0–0.2)

## 2018-03-10 MED ORDER — DARBEPOETIN ALFA 500 MCG/ML IJ SOSY
500.0000 ug | PREFILLED_SYRINGE | Freq: Once | INTRAMUSCULAR | Status: AC
  Administered 2018-03-10: 500 ug via SUBCUTANEOUS

## 2018-03-10 MED ORDER — DARBEPOETIN ALFA 500 MCG/ML IJ SOSY
PREFILLED_SYRINGE | INTRAMUSCULAR | Status: AC
  Filled 2018-03-10: qty 1

## 2018-03-10 NOTE — Patient Instructions (Signed)

## 2018-03-10 NOTE — Telephone Encounter (Signed)
FWD to PCP. Zelig Gacek S Damica Gravlin, CMA  

## 2018-03-10 NOTE — Telephone Encounter (Signed)
FWD to PCP. Tempestt S Roberts, CMA  

## 2018-03-11 ENCOUNTER — Telehealth: Payer: Self-pay | Admitting: Hematology

## 2018-03-11 NOTE — Telephone Encounter (Signed)
Per 12/16 sch message - pt request to leave as is on 12/27 - pt can only do later appts no AM

## 2018-03-12 ENCOUNTER — Other Ambulatory Visit: Payer: Medicare Other | Admitting: Primary Care

## 2018-03-12 DIAGNOSIS — Z79899 Other long term (current) drug therapy: Secondary | ICD-10-CM

## 2018-03-12 DIAGNOSIS — C50919 Malignant neoplasm of unspecified site of unspecified female breast: Secondary | ICD-10-CM

## 2018-03-12 DIAGNOSIS — Z531 Procedure and treatment not carried out because of patient's decision for reasons of belief and group pressure: Secondary | ICD-10-CM

## 2018-03-12 DIAGNOSIS — C7951 Secondary malignant neoplasm of bone: Secondary | ICD-10-CM

## 2018-03-12 DIAGNOSIS — Z515 Encounter for palliative care: Secondary | ICD-10-CM

## 2018-03-12 DIAGNOSIS — IMO0001 Reserved for inherently not codable concepts without codable children: Secondary | ICD-10-CM

## 2018-03-12 NOTE — Progress Notes (Signed)
[facility] Telephone: [phone] Fax: [phone]  PATIENT NAME: Michaela Little DOB: Jan 02, 1961 MRN: [id number]  PRIMARY CARE PROVIDER:   [name]  REFERRING PROVIDER:  Clent Demark, PA-C [place], [place] [address]  RESPONSIBLE PARTY:    Extended Emergency Contact Information Primary Emergency Contact: [place] Address: [address]          [place], [place] [address] [name] of [place] Phone: [phone] Relation: Daughter Secondary Emergency Contact: Renaldo Reel, [place] [address] [place] Phone: [phone] Relation: Relative  ASSESSMENT and RECOMMENDATIONS:  Patient was assessed in her home after a 2 week hospitalization for DVT.  1. Care needs at home.These are escalating due to debility.Pt lives alone with out of state relatives and in town church friends assisting when they can. Referral to [facility] from hospital. Pt states she spoke with S. Juleen China, she  has Feb. Appt. Needs Home Health. Home health has not started yet, [facility] states they did not receive referral for services to begin. Call to case manager at [facility] who reached out to [facility] to re-refer. Will involve Palliative SW to assess medicaid PCS eligibility.   2. Dyspnea: Oxygen increased to  4 L, tolerating well but still becomes dyspnic on exertion. Will fax new order for 4 liters oxygen humidified to DME company. Changed tubing with patient today, first time she's changed tubing. Has portable tanks which need refilling at a refill store; she will ask a church friend. Taking sq Lovenox at home.  3. Pain: Not well controlled. In right  Upper leg due to deep vein thrombus, taking tramadol 100 mg  but it was too sedating.Suggested 75 mg or adding 500 mg acetaminophen to the 50 mg tramadol and taking on regular interval. She will consider.    4. Goals of Care: Some family are here but patient does not want to discuss  today. Asked about people in area who can help with her care. Will continue to help with planning for care as ability declines.  Continue to follow for symptom management, goals of care clarification. Visit in 2 weeks or prn.   I spent 60 minutes providing this consultation,  from 1300 to 1400. More than 50% of the time in this consultation was spent coordinating communication.   HISTORY OF PRESENT ILLNESS:  Michaela Little is a 57 y.o. year old female with multiple medical problems including breast cancer, h/o DVT,obesity, HTN, HDL, OSA. Palliative Care was asked to help address goals of care.   CODE STATUS: FULL  PPS: weak 40% HOSPICE ELIGIBILITY/DIAGNOSIS: TBD  PAST MEDICAL HISTORY:  Past Medical History:  Diagnosis Date  . Anxiety   . Arthritis    hands, knees, hips  . Asthma   . Bronchitis   . Cancer ([place])   . Cervical stenosis (uterine cervix)   . Disorder of appendix    Enlarged  . Dyspnea on exertion   . GERD (gastroesophageal reflux disease)   . Headache(784.0)   . History of breast cancer    2010--  LEFT  s/p  mastectomy (in [place]) AND CHEMORADIATION--  NO RECURRENCE  . History of cervical dysplasia   . Hyperlipidemia   . Hypertension   . Malignant neoplasm of overlapping sites of left breast in female, estrogen receptor positive ([place]) 03/05/2013  . OSA (obstructive sleep apnea) moderate osa per study  09/2010   CPAP  , NOT USING  ON REGULAR BASIS  . Pelvic pain in female   . Positive H. pylori test    08-05-2013  . Positive TB test    AS TEEN--  [place] W/ MEDS  . Refusal of blood transfusions as patient is Jehovah's Witness   . Seasonal allergies   . Uterine fibroid   . Wears glasses     SOCIAL HX:  Social History   Tobacco Use  . Smoking status: Former Smoker    Packs/day: 0.30    Years: 10.00    Pack years: 3.00    Types: Cigarettes    Last attempt to quit: 03/26/1988    Years since quitting: 29.9  . Smokeless tobacco: Never Used  Substance Use Topics  .  Alcohol use: No    ALLERGIES:  Allergies  Allergen Reactions  . Lisinopril-Hydrochlorothiazide Itching  . Adhesive [Tape] Itching and Other (See Comments)    Redness  . Fentanyl Other (See Comments)    GI upset and drowsiness. Only to [facility]  . Gabapentin Other (See Comments)    Auditory hallucinations  . Hctz [Hydrochlorothiazide] Itching  . Latex Itching  . Lisinopril Itching  . Losartan Potassium Other (See Comments)    Makes her feel "bad"   . Other Other (See Comments)    Patient refuses blood for religious reasons (per her notes)  . Oxycodone     GI upset, drowsy  . Tums [Calcium Carbonate Antacid] Hives    FRUIT-FLAVORED ONES     PERTINENT MEDICATIONS:  Outpatient Encounter Medications as of 03/12/2018  Medication Sig  . cetirizine (ZYRTEC) 10 MG tablet Take 10 mg by mouth daily.  Marland Kitchen enoxaparin (LOVENOX) 120 MG/0.8ML injection Inject 0.73 mLs (110 mg total) into the skin every 12 (twelve) hours.  Marland Kitchen exemestane (AROMASIN) 25 MG tablet Take 1 tablet (25 mg total) by mouth daily after breakfast.  . fluticasone furoate-vilanterol (BREO ELLIPTA) 100-25 MCG/INH AEPB Inhale 1 puff into the lungs daily.  . folic acid (FOLVITE) 1 MG tablet Take 1 tablet (1 mg total) by mouth daily.  Marland Kitchen HYDROcodone-homatropine (HYCODAN) 5-1.5 MG/5ML syrup Take 5 mLs by mouth every 6 (six) hours as needed for cough.  Marland Kitchen ipratropium-albuterol (DUONEB) 0.5-2.5 (3) MG/3ML SOLN INHALE THREE MLS VIA NEBULIZER EVERY 6 HOURS AS NEEDED (Patient taking differently: Take 3 mLs by nebulization every 6 (six) hours as needed (for wheezing or shortness of breath). )  . montelukast (SINGULAIR) 10 MG tablet Take 1 tablet (10 mg total) by mouth at bedtime.  . pantoprazole (PROTONIX) 40 MG tablet Take 1 tablet (40 mg total) by mouth daily. Take 30-60 min before first meal of the day (Patient not taking: Reported on 02/27/2018)  . promethazine (PHENERGAN) 25 MG tablet Take 1 tablet (25 mg total) by mouth every 6  (six) hours as needed for nausea or vomiting.  . traMADol (ULTRAM) 50 MG tablet Take 2 tablets (100 mg total) by mouth every 6 (six) hours.  . vitamin B-12 1000 MCG tablet Take 1 tablet (1,000 mcg total) by mouth daily.   Facility-Administered Encounter Medications as of 03/12/2018  Medication  . 0.9 %  sodium chloride infusion    PHYSICAL EXAM:  VS 129/70 98.6-80-18, Pulse ox on 4 liters at rest = 95% General: NAD, frail appearing, obese Cardiovascular: regular rate and rhythm, S1S2, occ tachycardia Pulmonary: clear all fields, no cough Abdomen: soft, nontender, + bowel sounds, endorses normal bm Extremities: slight right edema, no joint deformities Skin: no rashes. Bruises on upper right arm  Neurological: Weakness but otherwise nonfocal, Sleep poor,  Alert and oriented x 3.  Cyndia Skeeters DNP, AGPCNP-BC

## 2018-03-17 ENCOUNTER — Telehealth (INDEPENDENT_AMBULATORY_CARE_PROVIDER_SITE_OTHER): Payer: Self-pay | Admitting: Physician Assistant

## 2018-03-17 NOTE — Telephone Encounter (Signed)
Dwyane Luo from [facility] called to get verbally orders for physical therapy for 2x a week for 6 weeks. Front desk gave the verbal  Mickel Baas [phone]  Emmit Pomfret

## 2018-03-20 NOTE — Telephone Encounter (Signed)
Yes, that is fine. 

## 2018-03-20 NOTE — Progress Notes (Signed)
[name]   [phone] [phone]   Clinic Follow up Note   Patient Care Team: [name] as PCP - General (Physician Assistant) [name], MD as Consulting Physician (Pulmonary Disease) [name], MD as Consulting Physician (Hematology) [name], NP as Nurse Practitioner (Hospice and Palliative Medicine)  Date of Service:  03/21/2018  CHIEF COMPLAINT: F/u of metastatic breast cancer and severe anemia   SUMMARY OF ONCOLOGIC HISTORY: Oncology History   Cancer Staging Malignant neoplasm of overlapping sites of left breast in female, estrogen receptor positive ([place]) Staging form: Breast, AJCC 7th Edition - Clinical: Stage IIIA (T2, N2, M0) - Unsigned       Malignant neoplasm of overlapping sites of left breast in female, estrogen receptor positive ([place])   05/2008 Cancer Diagnosis    Left sided breast cancer (no path report available)    11/2008 Pathologic Stage    Stage IIIA: T2 N2     Neo-Adjuvant Chemotherapy    Paclitaxel weekly x 12; doxorubicin and cyclophosphamide x 4 - both given with trifiparnib (treated at [facility] in [place])    11/2008 Definitive Surgery    Left modified radiation mastectomy: invasive lobular carcinoma, ER+, PR+, HER2/neu negative, 5/22 LN positive     - 03/2009 Radiation Therapy    Adjuvant radiation to left chest wall    2011 - 08/2014 Anti-estrogen oral therapy    Tamoxifen 20 mg used briefly; discontinued due to uterine lining concerns; changed to anastrozole 1 mg daily (began 07/2100); discontinued by patient summer of 2016    05/26/2015 Progression    Iliac bone biopsy showed metastatic carcinoma consistent with a breast primary, ER positive, PR and HER-2 negative. Her staging CT, and a PET scan was negative for visceral metastasis.    05/2015 - 01/2018 Anti-estrogen oral therapy    1. letrozole and Ibrance started March 2017             -Ibrance dose decreased to 75 mg daily.  Stopped in 11/2017 due to anemia   -Letrozole switched to Exemestane on 02/13/17 due to b/l rib pain. She letrozole herself in 01/2018.      11/16/2015 Miscellaneous    Xgeva monthly for bone metastasis     07/03/2016 Imaging    CT chest, abdomen and pelvis with contrast showed no evidence of metastasis.     02/07/2017 Imaging    CT AP W Contrast 02/07/17 IMPRESSION: 1. No CT findings for abdominal/pelvic metastatic disease. 2. No acute abdominal findings, mass lesions or adenopathy. 3. Status post cholecystectomy with mild associated common bile duct dilatation. 4. Somewhat thickened appendix appears relatively stable. No acute inflammatory process. 5. Stable mixed lytic and sclerotic osseous metastatic disease.     02/07/2017 Imaging    Bone Scan Whole Body 02/07/17 IMPRESSION: No scintigraphic evidence skeletal metastasis     08/12/2017 Imaging    Whole Boday Scan 08/12/17 IMPRESSION: Scattered degenerative type uptake as above with a questionable focus of increased tracer localization at L2 vertebral body versus artifact; no abnormality is seen at this site by CT. Consider either characterization by MR or attention on follow-up imaging.    08/12/2017 Imaging    CT AP W Contrast 08/12/17  IMPRESSION: Continued thickening of the appendix is noted without surrounding inflammation. It has maximum measured diameter is decreased compared to prior exam. There is no evidence of acute inflammation. Stable mixed lytic and sclerotic appearance of visualized skeleton is noted consistent with history of  known osseous metastases. No acute abnormality seen in the abdomen or pelvis.    08/22/2017 Surgery    APPENDECTOMY LAPAROSCOPIC ERAS PATHWAY by Dr. Malcolm Metro and REMOVAL OF LEFT TISSUE EXPANDER by Dr. Harlow Mares 08/22/17    08/22/2017 Pathology Results    Diagnosis 08/22/17  1. Appendix, Other than Incidental - METASTATIC CARCINOMA, CONSISTENT WITH BREAST PRIMARY. - CARCINOMA IS PRESENT  AT THE SURGICAL RESECTION MARGIN AS WELL AS THE SEROSAL SURFACE. - LYMPHOVASCULAR INVASION IS IDENTIFIED. - SEE COMMENT. 2. Breast, capsule, Left - DENSE FIBROUS TISSUE WITH MILD INFLAMMATION, INCLUDING A FEW SCATTERED MULTINUCLEATED GIANT CELLS. - THERE IS NO EVIDENCE OF MALIGNANCY.    08/27/2017 Imaging    CT AP W Contrast 08/27/17 IMPRESSION: No evidence of pulmonary embolus. Small pericardial effusion with uncertain clinical significance. No evidence of acute abnormalities within the chest, abdomen or pelvis. Area of fat stranding and small focus of gas within the anterior abdominal wall, immediately inferior to the umbilicus, probably at the site of laparoscopic access.    10/07/2017 Echocardiogram    10/07/2017 ECO Study Conclusions  - Procedure narrative: Transthoracic echocardiography. Image   quality was adequate. The study was technically difficult, as a   result of poor acoustic windows. - Left ventricle: The cavity size was normal. Wall thickness was   normal. Systolic function was normal. The estimated ejection   fraction was in the range of 60% to 65%. Wall motion was normal;   there were no regional wall motion abnormalities. Features are   consistent with a pseudonormal left ventricular filling pattern,   with concomitant abnormal relaxation and increased filling   pressure (grade 2 diastolic dysfunction).    01/16/2018 PET scan    01/16/2018 PET Scan IMPRESSION: 1. Most striking finding is intense marrow activity diffusely throughout the axillary and appendicular skeleton. This is favored a physiologic marrow response from chronic anemia rather than diffuse metastatic disease. 2. Several discrete foci of more intense activity noted in the spine which could indicate malignancy or trauma. Difficult to define malignancy on the background of diffuse marrow activity. 3. Diffuse sclerosis throughout the bones similar to a recent CT scans but new from remote PET-CT  scan 03/01/2016. 4. No evidence soft tissue metastasis.       CURRENT THERAPY:  -Aranesp injection every 2 weeks -B12 injections monthly  -Lovenox '120mg'$  q12h   INTERVAL HISTORY:  [employer] is here for a follow up post hospitalization. She is presents to the clinic today by herself. She notes to being stable overall. She notes right LE swelling and pain. She notes her daughter where here to help her but have returned to [employer]. She ambulated with walker at home. She is on Lovenox BID. She is on cough syrup currently which she would like a refill. She notes being SOB all the time but her O2 is normal. She also notes left flank pain.    REVIEW OF SYSTEMS:   Constitutional: Denies fevers, chills or abnormal weight loss Eyes: Denies blurriness of vision Ears, nose, mouth, throat, and face: Denies mucositis or sore throat Respiratory: Denies dyspnea or wheezes (+) SOB and Cough  Cardiovascular: Denies palpitation, chest discomfort or lower extremity swelling Gastrointestinal:  Denies nausea, heartburn or change in bowel habits (+) left flank pain  Skin: Denies abnormal skin rashes Lymphatics: Denies new lymphadenopathy or easy bruising Neurological:Denies numbness, tingling or new weaknesses Behavioral/Psych: Mood is stable, no new changes  All other systems were reviewed with the patient and are negative.  MEDICAL HISTORY:  Past Medical History:  Diagnosis Date  . Anxiety   . Arthritis    hands, knees, hips  . Asthma   . Bronchitis   . Cancer ([place])   . Cervical stenosis (uterine cervix)   . Disorder of appendix    Enlarged  . Dyspnea on exertion   . GERD (gastroesophageal reflux disease)   . Headache(784.0)   . History of breast cancer    2010--  LEFT  s/p  mastectomy (in [place]) AND CHEMORADIATION--  NO RECURRENCE  . History of cervical dysplasia   . Hyperlipidemia   . Hypertension   . Malignant neoplasm of overlapping sites of left breast in female, estrogen receptor  positive ([place]) 03/05/2013  . OSA (obstructive sleep apnea) moderate osa per study  09/2010   CPAP  , NOT USING ON REGULAR BASIS  . Pelvic pain in female   . Positive H. pylori test    08-05-2013  . Positive TB test    AS TEEN--  [place] W/ MEDS  . Refusal of blood transfusions as patient is Jehovah's Witness   . Seasonal allergies   . Uterine fibroid   . Wears glasses     SURGICAL HISTORY: Past Surgical History:  Procedure Laterality Date  . APPENDECTOMY  y  . CARDIOVASCULAR STRESS TEST  10-29-2012   low risk perfusion study/  no significant reversibity/ ef 66%/  normal wall motion  . CERVICAL CONIZATION W/BX  2012   in  [place] N/A 05/14/2012   Procedure: LAPAROSCOPIC CHOLECYSTECTOMY;  Surgeon: Ralene Ok, MD;  Location: [place];  Service: General;  Laterality: N/A;  . COLONOSCOPY  2011   normal per patient - [place]  . DILATION AND CURETTAGE OF UTERUS  10/15/2011   Procedure: DILATATION AND CURETTAGE;  Surgeon: Melina Schools, MD;  Location: [facility];  Service: Gynecology;  Laterality: N/A;  Conization &  endocervical curettings  . EXAMINATION UNDER ANESTHESIA N/A 08/20/2013   Procedure: EXAM UNDER ANESTHESIA;  Surgeon: Margarette Asal, MD;  Location[facility];  Service: Gynecology;  Laterality: N/A;  . [place]   right  . IR IVC FILTER PLMT / S&I /IMG GUID/MOD SED  03/01/2018  . LAPAROSCOPIC APPENDECTOMY N/A 08/22/2017   Procedure: [place];  Surgeon: Coralie Keens, MD;  Location: [place];  Service: General;  Laterality: N/A;  . LAPAROSCOPIC ASSISTED VAGINAL HYSTERECTOMY N/A 10/26/2014   Procedure: HYSTERECTOMY ABDOMINAL ;  Surgeon: Molli Posey, MD;  Location: [facility];  Service: Gynecology;  Laterality: N/A;  . MASTECTOMY Left 11/2008  in [place]   . SALPINGOOPHORECTOMY Bilateral 10/26/2014   Procedure: BILATERAL SALPINGO OOPHORECTOMY;  Surgeon: Molli Posey, MD;  Location: [facility];  Service:  Gynecology;  Laterality: Bilateral;  . TISSUE EXPANDER PLACEMENT Left 08/22/2017   Procedure: REMOVAL OF LEFT TISSUE EXPANDER;  Surgeon: Crissie Reese, MD;  Location: [place];  Service: Plastics;  Laterality: Left;  . TISSUE EXPANDER REMOVAL Left 08/22/2017  . TRANSTHORACIC ECHOCARDIOGRAM  10-29-2012   mild lvh/  ef 60-65%/  grade II diastolic dysfunction/  trivial mr  &  tr    I have reviewed the social history and family history with the patient and they are unchanged from previous note.  ALLERGIES:  is allergic to lisinopril-hydrochlorothiazide; adhesive [tape]; fentanyl; gabapentin; hctz [hydrochlorothiazide]; latex; lisinopril; losartan potassium; other; oxycodone; and tums [calcium carbonate antacid].  MEDICATIONS:  Current Outpatient Medications  Medication Sig Dispense  Refill  . cetirizine (ZYRTEC) 10 MG tablet Take 10 mg by mouth daily.  11  . enoxaparin (LOVENOX) 120 MG/0.8ML injection Inject 0.73 mLs (110 mg total) into the skin every 12 (twelve) hours. 43.8 mL 2  . exemestane (AROMASIN) 25 MG tablet Take 1 tablet (25 mg total) by mouth daily after breakfast. 90 tablet 1  . fluticasone furoate-vilanterol (BREO ELLIPTA) 100-25 MCG/INH AEPB Inhale 1 puff into the lungs daily. 28 each 5  . folic acid (FOLVITE) 1 MG tablet Take 1 tablet (1 mg total) by mouth daily. 30 tablet 0  . HYDROcodone-homatropine (HYCODAN) 5-1.5 MG/5ML syrup Take 5 mLs by mouth every 6 (six) hours as needed for cough. 120 mL 0  . ipratropium-albuterol (DUONEB) 0.5-2.5 (3) MG/3ML SOLN INHALE THREE MLS VIA NEBULIZER EVERY 6 HOURS AS NEEDED (Patient taking differently: Take 3 mLs by nebulization every 6 (six) hours as needed (for wheezing or shortness of breath). ) 360 mL 5  . traMADol (ULTRAM) 50 MG tablet Take 2 tablets (100 mg total) by mouth every 6 (six) hours. 30 tablet 0  . vitamin B-12 1000 MCG tablet Take 1 tablet (1,000 mcg total) by mouth daily. 30 tablet 0   No current facility-administered medications  for this visit.    Facility-Administered Medications Ordered in Other Visits  Medication Dose Route Frequency Provider Last Rate Last Dose  . 0.9 %  sodium chloride infusion   Intravenous Once [name], MD        PHYSICAL EXAMINATION: ECOG PERFORMANCE STATUS: 3 - Symptomatic, >50% confined to bed  Vitals:   03/21/18 1446  BP: (!) 180/72  Pulse: 80  Resp: 16  Temp: 98.7 F (37.1 C)  SpO2: 100%   Filed Weights   03/21/18 1446  Weight: 231 lb 12.8 oz (105.1 kg)    GENERAL:alert, no distress and comfortable SKIN: skin color, texture, turgor are normal, no rashes or significant lesions EYES: normal, Conjunctiva are pink and non-injected, sclera clear OROPHARYNX:no exudate, no erythema and lips, buccal mucosa, and tongue normal  NECK: supple, thyroid normal size, non-tender, without nodularity LYMPH:  no palpable lymphadenopathy in the cervical, axillary or inguinal LUNGS: clear to auscultation and percussion with normal breathing effort HEART: regular rate & rhythm and no murmurs and no lower extremity edema ABDOMEN:abdomen soft, non-tender and normal bowel sounds (+) left flank tenderness with palpable lump  Musculoskeletal:no cyanosis of digits and no clubbing  NEURO: alert & oriented x 3 with fluent speech, no focal motor/sensory deficits  LABORATORY DATA:  I have reviewed the data as listed CBC Latest Ref Rng & Units 03/21/2018 03/10/2018 03/06/2018  WBC 4.0 - 10.5 K/uL 4.1 4.7 5.5  Hemoglobin 12.0 - 15.0 g/dL 4.7(LL) 4.7(LL) 4.4(LL)  Hematocrit 36.0 - 46.0 % 16.8(L) 16.8(L) 15.8(L)  Platelets 150 - 400 K/uL 113(L) 85(L) 78(L)     CMP Latest Ref Rng & Units 03/05/2018 03/03/2018 03/01/2018  Glucose 70 - 99 mg/dL 106(H) 118(H) 131(H)  BUN 6 - 20 mg/dL _0 Creatinine 0.44 - 1.00 mg/dL 0.72 0.78 0.81  Sodium 135 - 145 mmol/L 137 137 138  Potassium 3.5 - 5.1 mmol/L 4.0 3.9 4.0  Chloride 98 - 111 mmol/L 102 103 103  CO2 22 - 32 mmol/L _1 Calcium 8.9 - 10.3  mg/dL 9.8 9.8 10.0  Total Protein 6.5 - 8.1 g/dL - - 7.0  Total Bilirubin 0.3 - 1.2 mg/dL - - 2.0(H)  Alkaline Phos 38 - 126 U/L - - 114  AST 15 - 41 U/L - - 22  ALT 0 - 44 U/L - - 15      RADIOGRAPHIC STUDIES: I have personally reviewed the radiological images as listed and agreed with the findings in the report. No results found.   ASSESSMENT & PLAN:  Michaela Little is a 57 y.o. female with   1. 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 -Unfortunately she developed bone metastasis 1 year after she stopped anastrozole.  She has been on AI and Ibrance since then, Ibrance stopped in 11/2017 due to severe anemia. She also stopped exemestane in late November 2019, due to rib pain. -Monthly Xgeva injection has been held since September 2019 when she developed severe anemia  -I discussed her FO genomic test results. 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 We discussed the small risk (5%) of anemia, she is reluctant to start due to the concerns of AEs.  -she has stopped exemestane. I recommend switching it to fulvestrant injection, benefit and side effects discussed, she declined. -She declined further cancer treatment but is interested in another scan for follow up. I will get CT AP.   -She has home care assistance at home.  -Labs reviewed, hg stable at 4.7, PLT improved to 113K. Will proceed with Aranesp injection today and continue every 2 weeks  -F/u in 4 weeks   2. Severe anemia  -She developed worsening anemia in September 2019, lowest hemoglobin 3.9, likely secondary to [place] and diffuse bone metastasis  -She is a Jehovah witness, refuses blood transfusion -She has been on Aranesp injection since then,  anemia slowly improving. Will continue '500mg'$  injections every 2 weeks  -will continue B12 injection monthly  -Hg stable at 4.7 today (03/21/18)  3. Right LE DVT, Recent PE  -She was advised to restarted Lovenox '80mg'$ , but stopped on her own due to the concerns of bleeding  -She was recently hospitalized on 02/27/18 for multiple PE. This is likely related to metastatic cancer, immobility and Aranesp injections.  Patient wishes to continue Aranesp injection despite the risk of thrombosis. -She declined thrombectomy and Angovac which was offer by IR Dr. Geroge Baseman  -She still has significant lower extremity edema  4. Cough and Dyspnea -f/u with pulmonologist Dr. Melvyn Novas -on Flovent -Cough improved with Hycodan, I refilled today (03/21/18) -Lung remain clear and stable on exam today    5. Goal of care discussion  -she is full code now.  -We have discussed that her cancer is incurable at this stage, she understands the goal of care is palliative. -I discussed DNR/DNI before and she wishes to be full code   Plan  -I refilled Hycodan and Lovenox today  -Hg at 4.7 today Will proceed with Aranesp and B12 injection today  -Continue B12 injection monthly, and Aranesp injection every 2 weeks -Lab and injection in 2 and 4 weeks  -F/u in 4 weeks with CT abd/pel scan in 3 weeks     No problem-specific Assessment & Plan notes found for this encounter.   No orders of the defined types were placed in this encounter.  All questions were answered. The patient knows to call the clinic with any problems, questions or concerns. No barriers to learning was detected. I spent 20 minutes counseling the patient face  to face. The total time spent in the appointment was 25 minutes and more than 50% was on counseling and review of test results     [name], MD 03/21/2018   I, Joslyn Devon, am acting as scribe for [name], MD.   I have reviewed the above documentation for accuracy and completeness,  and I agree with the above.

## 2018-03-20 NOTE — Telephone Encounter (Signed)
FWD to PCP. Tempestt S Roberts, CMA  

## 2018-03-21 ENCOUNTER — Inpatient Hospital Stay: Payer: Medicare Other

## 2018-03-21 ENCOUNTER — Inpatient Hospital Stay (HOSPITAL_BASED_OUTPATIENT_CLINIC_OR_DEPARTMENT_OTHER): Payer: Medicare Other | Admitting: Hematology

## 2018-03-21 ENCOUNTER — Telehealth: Payer: Self-pay | Admitting: Hematology

## 2018-03-21 VITALS — BP 159/62

## 2018-03-21 VITALS — BP 180/72 | HR 80 | Temp 98.7°F | Resp 16 | Ht 67.0 in | Wt 231.8 lb

## 2018-03-21 DIAGNOSIS — D649 Anemia, unspecified: Secondary | ICD-10-CM

## 2018-03-21 DIAGNOSIS — R0600 Dyspnea, unspecified: Secondary | ICD-10-CM

## 2018-03-21 DIAGNOSIS — C785 Secondary malignant neoplasm of large intestine and rectum: Secondary | ICD-10-CM | POA: Diagnosis not present

## 2018-03-21 DIAGNOSIS — C50812 Malignant neoplasm of overlapping sites of left female breast: Secondary | ICD-10-CM

## 2018-03-21 DIAGNOSIS — Z170 Estrogen receptor positive status [ER+]: Secondary | ICD-10-CM

## 2018-03-21 DIAGNOSIS — Z7189 Other specified counseling: Secondary | ICD-10-CM

## 2018-03-21 DIAGNOSIS — C7951 Secondary malignant neoplasm of bone: Secondary | ICD-10-CM | POA: Diagnosis not present

## 2018-03-21 DIAGNOSIS — C50912 Malignant neoplasm of unspecified site of left female breast: Secondary | ICD-10-CM

## 2018-03-21 DIAGNOSIS — R05 Cough: Secondary | ICD-10-CM

## 2018-03-21 DIAGNOSIS — I82401 Acute embolism and thrombosis of unspecified deep veins of right lower extremity: Secondary | ICD-10-CM

## 2018-03-21 DIAGNOSIS — I2699 Other pulmonary embolism without acute cor pulmonale: Secondary | ICD-10-CM

## 2018-03-21 LAB — CBC WITH DIFFERENTIAL/PLATELET
Abs Immature Granulocytes: 0.25 10*3/uL — ABNORMAL HIGH (ref 0.00–0.07)
BASOS PCT: 1 %
Basophils Absolute: 0 10*3/uL (ref 0.0–0.1)
Eosinophils Absolute: 0.1 10*3/uL (ref 0.0–0.5)
Eosinophils Relative: 2 %
HCT: 16.8 % — ABNORMAL LOW (ref 36.0–46.0)
Hemoglobin: 4.7 g/dL — CL (ref 12.0–15.0)
Immature Granulocytes: 6 %
Lymphocytes Relative: 36 %
Lymphs Abs: 1.5 10*3/uL (ref 0.7–4.0)
MCH: 27.8 pg (ref 26.0–34.0)
MCHC: 28 g/dL — AB (ref 30.0–36.0)
MCV: 99.4 fL (ref 80.0–100.0)
Monocytes Absolute: 0.4 10*3/uL (ref 0.1–1.0)
Monocytes Relative: 9 %
NEUTROS ABS: 1.9 10*3/uL (ref 1.7–7.7)
Neutrophils Relative %: 46 %
Platelets: 113 10*3/uL — ABNORMAL LOW (ref 150–400)
RBC: 1.69 MIL/uL — ABNORMAL LOW (ref 3.87–5.11)
RDW: 19.9 % — ABNORMAL HIGH (ref 11.5–15.5)
WBC: 4.1 10*3/uL (ref 4.0–10.5)
nRBC: 2 % — ABNORMAL HIGH (ref 0.0–0.2)

## 2018-03-21 MED ORDER — ENOXAPARIN SODIUM 120 MG/0.8ML ~~LOC~~ SOLN: 1.0000 mg/kg | Freq: Two times a day (BID) | SUBCUTANEOUS | 2 refills | Status: DC

## 2018-03-21 MED ORDER — HYDROCODONE-HOMATROPINE 5-1.5 MG/5ML PO SYRP: 5.0000 mL | ORAL_SOLUTION | Freq: Four times a day (QID) | ORAL | 0 refills | Status: DC | PRN

## 2018-03-21 MED ORDER — DARBEPOETIN ALFA 500 MCG/ML IJ SOSY
500.0000 ug | PREFILLED_SYRINGE | Freq: Once | INTRAMUSCULAR | Status: AC
  Administered 2018-03-21: 500 ug via SUBCUTANEOUS

## 2018-03-21 MED ORDER — DARBEPOETIN ALFA 500 MCG/ML IJ SOSY
PREFILLED_SYRINGE | INTRAMUSCULAR | Status: AC
  Filled 2018-03-21: qty 1

## 2018-03-21 MED ORDER — CYANOCOBALAMIN 1000 MCG/ML IJ SOLN
INTRAMUSCULAR | Status: AC
  Filled 2018-03-21: qty 1

## 2018-03-21 MED ORDER — CYANOCOBALAMIN 1000 MCG/ML IJ SOLN
1000.0000 ug | Freq: Once | INTRAMUSCULAR | Status: AC
  Administered 2018-03-21: 1000 ug via INTRAMUSCULAR

## 2018-03-21 NOTE — Telephone Encounter (Signed)
Printed calendar and avs. °

## 2018-03-23 ENCOUNTER — Encounter: Payer: Self-pay | Admitting: Hematology

## 2018-03-24 ENCOUNTER — Telehealth: Payer: Self-pay

## 2018-03-24 NOTE — Telephone Encounter (Signed)
Visit scheduled with Palliative Care for 03/25/18

## 2018-03-25 ENCOUNTER — Other Ambulatory Visit: Payer: Self-pay

## 2018-03-25 ENCOUNTER — Other Ambulatory Visit: Payer: Medicare Other | Admitting: Primary Care

## 2018-03-25 ENCOUNTER — Other Ambulatory Visit: Payer: Self-pay | Admitting: Hematology

## 2018-03-25 DIAGNOSIS — C7951 Secondary malignant neoplasm of bone: Secondary | ICD-10-CM

## 2018-03-25 DIAGNOSIS — J40 Bronchitis, not specified as acute or chronic: Secondary | ICD-10-CM

## 2018-03-25 DIAGNOSIS — Z515 Encounter for palliative care: Secondary | ICD-10-CM

## 2018-03-25 DIAGNOSIS — C50919 Malignant neoplasm of unspecified site of unspecified female breast: Secondary | ICD-10-CM

## 2018-03-25 DIAGNOSIS — Z7189 Other specified counseling: Secondary | ICD-10-CM

## 2018-03-25 NOTE — Progress Notes (Signed)
[facility] Telephone: [phone] Fax: [phone]  PATIENT NAME: Michaela Little DOB: 1960-06-27 MRN: [id number]  PRIMARY CARE PROVIDER:   Clent Demark, PA-C  REFERRING PROVIDER:  Clent Demark, PA-C [place], [place] [address]    RESPONSIBLE PARTY:   Extended Emergency Contact Information Primary Emergency Contact: [place] Address: [address]          [place], [place] [address] [name] of [place] Phone: [phone] Relation: Daughter Secondary Emergency Contact: Renaldo Reel, [place] [address] [place] Phone: [phone] Relation: Relative  Charlott Rakes, MD  ASSESSMENT and RECOMMENDATIONS:  1. Disease Process: Has stopped taking exemestane and will have scan  Upcoming in January. States she is having difficulty tolerating any chemo treatments.   2. Care needs: Continues to live alone and ambulates with difficulty. Oxygen at 4 L allows some independence. Daughter and grand daughter live  in town and help with chores, son lives in [place] and other family in [place].  Called DSS for PCA but no return, will try again.  3. Dyspnea: improved somewhat with oxygen at 4 L. Not sleeping well. Also stopped Breo. Recommended to pt to start Breo. Also recommend melatonin 5 mg nightly for poor sleep. Oxygen is now humidified, 4 L.   4 Pain: Reticent to take meds,Has tramadol. Encouraged to treat pain. Recommended 75 mg as before.  5. Blood Clot in leg: Appt 14th with vascular. Has filter. Taking lovenox and states will not be able to transition to oral due to platelets. No more severe or acute SOB   Palliative to continue to follow for goals of care, symptom management. Return 3-4 weeks or prn.  I spent 25 minutes providing this consultation,  from 1200 to 1225. More than 50% of the time in this consultation was spent coordinating communication.   HISTORY OF PRESENT ILLNESS:  Michaela Little is a 57  y.o. year old female with multiple medical problems including breast cancer,h/o DVT,obesity, HTN, HDL, OSA.[facility] Palliative Care was asked to help address goals of care.   CODE STATUS: FULL  PPS: 40% HOSPICE ELIGIBILITY/DIAGNOSIS: breast cancer  PAST MEDICAL HISTORY:  Past Medical History:  Diagnosis Date  . Anxiety   . Arthritis    hands, knees, hips  . Asthma   . Bronchitis   . Cancer ([place])   . Cervical stenosis (uterine cervix)   . Disorder of appendix    Enlarged  . Dyspnea on exertion   . GERD (gastroesophageal reflux disease)   . Headache(784.0)   . History of breast cancer    2010--  LEFT  s/p  mastectomy (in [place]) AND CHEMORADIATION--  NO RECURRENCE  . History of cervical dysplasia   . Hyperlipidemia   . Hypertension   . Malignant neoplasm of overlapping sites of left breast in female, estrogen receptor positive ([place]) 03/05/2013  . OSA (obstructive sleep apnea) moderate osa per study  09/2010   CPAP  , NOT USING ON REGULAR BASIS  . Pelvic pain in female   . Positive H. pylori test    08-05-2013  . Positive TB test    AS TEEN--  [place] W/ MEDS  . Refusal of blood transfusions as patient is Jehovah's Witness   . Seasonal allergies   . Uterine fibroid   . Wears glasses     SOCIAL HX:  Social History   Tobacco Use  . Smoking status: Former Smoker  Packs/day: 0.30    Years: 10.00    Pack years: 3.00    Types: Cigarettes    Last attempt to quit: 03/26/1988    Years since quitting: 30.0  . Smokeless tobacco: Never Used  Substance Use Topics  . Alcohol use: No    ALLERGIES:  Allergies  Allergen Reactions  . Lisinopril-Hydrochlorothiazide Itching  . Adhesive [Tape] Itching and Other (See Comments)    Redness  . Fentanyl Other (See Comments)    GI upset and drowsiness. Only to [facility]  . Gabapentin Other (See Comments)    Auditory hallucinations  . Hctz [Hydrochlorothiazide] Itching  . Latex Itching  . Lisinopril Itching  . Losartan Potassium Other  (See Comments)    Makes her feel "bad"   . Other Other (See Comments)    Patient refuses blood for religious reasons (per her notes)  . Oxycodone     GI upset, drowsy  . Tums [Calcium Carbonate Antacid] Hives    FRUIT-FLAVORED ONES     PERTINENT MEDICATIONS:  Outpatient Encounter Medications as of 03/25/2018  Medication Sig  . cetirizine (ZYRTEC) 10 MG tablet Take 10 mg by mouth daily.  Marland Kitchen enoxaparin (LOVENOX) 120 MG/0.8ML injection Inject 0.73 mLs (110 mg total) into the skin every 12 (twelve) hours.  Marland Kitchen exemestane (AROMASIN) 25 MG tablet Take 1 tablet (25 mg total) by mouth daily after breakfast.  . fluticasone furoate-vilanterol (BREO ELLIPTA) 100-25 MCG/INH AEPB Inhale 1 puff into the lungs daily.  . folic acid (FOLVITE) 1 MG tablet Take 1 tablet (1 mg total) by mouth daily.  Marland Kitchen HYDROcodone-homatropine (HYCODAN) 5-1.5 MG/5ML syrup Take 5 mLs by mouth every 6 (six) hours as needed for cough.  Marland Kitchen ipratropium-albuterol (DUONEB) 0.5-2.5 (3) MG/3ML SOLN INHALE THREE MLS VIA NEBULIZER EVERY 6 HOURS AS NEEDED (Patient taking differently: Take 3 mLs by nebulization every 6 (six) hours as needed (for wheezing or shortness of breath). )  . traMADol (ULTRAM) 50 MG tablet Take 2 tablets (100 mg total) by mouth every 6 (six) hours.  . vitamin B-12 1000 MCG tablet Take 1 tablet (1,000 mcg total) by mouth daily.   Facility-Administered Encounter Medications as of 03/25/2018  Medication  . 0.9 %  sodium chloride infusion    PHYSICAL EXAM:  VS 41 cm 97.8-78-22  154/83  General: NAD, frail appearing, obese Cardiovascular: regular rate and rhythm, S1S2s4. Pulmonary: clear all fields, dry cough. No resp distress Abdomen: soft, nontender, + bowel sounds, denies constiation Extremities: no edema, no joint deformities. Right leg visibly wide than left in thigh, d/t clot, not new finding Skin: no rashes Neurological: Weakness , poor sleep, good historian  Phill Myron. Jackey Loge, AGPCNP-BC

## 2018-04-01 ENCOUNTER — Telehealth: Payer: Self-pay

## 2018-04-01 ENCOUNTER — Telehealth: Payer: Self-pay | Admitting: Licensed Clinical Social Worker

## 2018-04-01 NOTE — Telephone Encounter (Signed)
Incoming call received from Ms. Colletta Maryland [phone], with [facility]. Pt has ongoing medical conditions and is requesting in-home services to assist with activities of daily living (ADL's)   To date, pt has been denied [facility]) for unknown reasons. Ms. Colletta Maryland reports that pt is not appropriate for services at [facility].   [facility] agreed to follow up with pt's PCP, in addition, to RN CM to discuss new referral to [facility].

## 2018-04-01 NOTE — Telephone Encounter (Signed)
Call placed to patient with Christa See, LCSW/[facility] to discuss need for PCS.  Patient explained her current need for assistance with ADLs/IADL. She said that she currently receives home health care  - SN,PT,OT, aide as well as palliative care services.  She said that [employer] has denied her in services in the past because she just needed " observation." She is agreeable to having a new referral submitted as her needs have increased.  Will need to submit referral to PCP for approval of the request.   Call placed to [facility]. Spoke to [place] who stated that the patient was not approved for services because the documentation that they received in the past stated that the patient was not medically stable and they are not able to provide services to individuals who are not medically stable.

## 2018-04-01 NOTE — Telephone Encounter (Signed)
It is true patient was medically unstable with a dangerously low anemia which kept her hospitalized. She will need to be seen by her new provider or she can have her oncologist write for these services. Thank you.

## 2018-04-02 ENCOUNTER — Encounter (HOSPITAL_COMMUNITY): Payer: Self-pay | Admitting: Emergency Medicine

## 2018-04-02 ENCOUNTER — Other Ambulatory Visit: Payer: Self-pay

## 2018-04-02 ENCOUNTER — Emergency Department (HOSPITAL_COMMUNITY): Payer: Medicare Other

## 2018-04-02 ENCOUNTER — Emergency Department (HOSPITAL_COMMUNITY)
Admission: EM | Admit: 2018-04-02 | Discharge: 2018-04-02 | Disposition: A | Discharge status: Home / Self Care | Payer: Medicare Other | Attending: Emergency Medicine | Admitting: Emergency Medicine

## 2018-04-02 ENCOUNTER — Telehealth: Payer: Self-pay | Admitting: Licensed Clinical Social Worker

## 2018-04-02 DIAGNOSIS — C799 Secondary malignant neoplasm of unspecified site: Secondary | ICD-10-CM | POA: Diagnosis not present

## 2018-04-02 DIAGNOSIS — Z79899 Other long term (current) drug therapy: Secondary | ICD-10-CM | POA: Diagnosis not present

## 2018-04-02 DIAGNOSIS — Z87891 Personal history of nicotine dependence: Secondary | ICD-10-CM | POA: Diagnosis not present

## 2018-04-02 DIAGNOSIS — D696 Thrombocytopenia, unspecified: Secondary | ICD-10-CM | POA: Insufficient documentation

## 2018-04-02 DIAGNOSIS — E876 Hypokalemia: Secondary | ICD-10-CM | POA: Insufficient documentation

## 2018-04-02 DIAGNOSIS — I10 Essential (primary) hypertension: Secondary | ICD-10-CM | POA: Diagnosis not present

## 2018-04-02 DIAGNOSIS — D649 Anemia, unspecified: Secondary | ICD-10-CM | POA: Diagnosis not present

## 2018-04-02 DIAGNOSIS — R1084 Generalized abdominal pain: Secondary | ICD-10-CM | POA: Diagnosis not present

## 2018-04-02 LAB — CBC WITH DIFFERENTIAL/PLATELET
Abs Immature Granulocytes: 0.23 10*3/uL — ABNORMAL HIGH (ref 0.00–0.07)
Basophils Absolute: 0 10*3/uL (ref 0.0–0.1)
Basophils Relative: 0 %
Eosinophils Absolute: 0.1 10*3/uL (ref 0.0–0.5)
Eosinophils Relative: 2 %
HCT: 18.9 % — ABNORMAL LOW (ref 36.0–46.0)
Hemoglobin: 5.3 g/dL — CL (ref 12.0–15.0)
Immature Granulocytes: 6 %
Lymphocytes Relative: 27 %
Lymphs Abs: 1 10*3/uL (ref 0.7–4.0)
MCH: 28.3 pg (ref 26.0–34.0)
MCHC: 28 g/dL — ABNORMAL LOW (ref 30.0–36.0)
MCV: 101.1 fL — ABNORMAL HIGH (ref 80.0–100.0)
Monocytes Absolute: 0.3 10*3/uL (ref 0.1–1.0)
Monocytes Relative: 8 %
Neutro Abs: 2.1 10*3/uL (ref 1.7–7.7)
Neutrophils Relative %: 57 %
Platelets: 107 10*3/uL — ABNORMAL LOW (ref 150–400)
RBC: 1.87 MIL/uL — ABNORMAL LOW (ref 3.87–5.11)
RDW: 19.8 % — ABNORMAL HIGH (ref 11.5–15.5)
WBC: 3.7 10*3/uL — AB (ref 4.0–10.5)
nRBC: 10.5 % — ABNORMAL HIGH (ref 0.0–0.2)

## 2018-04-02 LAB — URINALYSIS, ROUTINE W REFLEX MICROSCOPIC
Bilirubin Urine: NEGATIVE
Glucose, UA: NEGATIVE mg/dL
Hgb urine dipstick: NEGATIVE
Ketones, ur: NEGATIVE mg/dL
Nitrite: NEGATIVE
Protein, ur: NEGATIVE mg/dL
Specific Gravity, Urine: 1.012 (ref 1.005–1.030)
pH: 6 (ref 5.0–8.0)

## 2018-04-02 LAB — COMPREHENSIVE METABOLIC PANEL
ALK PHOS: 90 U/L (ref 38–126)
ALT: 15 U/L (ref 0–44)
AST: 26 U/L (ref 15–41)
Albumin: 3.7 g/dL (ref 3.5–5.0)
Anion gap: 10 (ref 5–15)
BUN: 10 mg/dL (ref 6–20)
CO2: 26 mmol/L (ref 22–32)
CREATININE: 0.72 mg/dL (ref 0.44–1.00)
Calcium: 10.5 mg/dL — ABNORMAL HIGH (ref 8.9–10.3)
Chloride: 105 mmol/L (ref 98–111)
GFR calc Af Amer: >60 mL/min (ref >60)
GFR calc non Af Amer: >60 mL/min (ref >60)
Glucose, Bld: 150 mg/dL — ABNORMAL HIGH (ref 70–99)
Potassium: 3.2 mmol/L — ABNORMAL LOW (ref 3.5–5.1)
Sodium: 141 mmol/L (ref 135–145)
Total Bilirubin: 1.3 mg/dL — ABNORMAL HIGH (ref 0.3–1.2)
Total Protein: 7.4 g/dL (ref 6.5–8.1)

## 2018-04-02 MED ORDER — HYDROCODONE-ACETAMINOPHEN 5-325 MG PO TABS: 1.0000 | ORAL_TABLET | Freq: Once | ORAL | Status: DC

## 2018-04-02 MED ORDER — IOPAMIDOL (ISOVUE-370) INJECTION 76%
INTRAVENOUS | Status: AC
  Filled 2018-04-02: qty 100

## 2018-04-02 MED ORDER — POTASSIUM CHLORIDE CRYS ER 20 MEQ PO TBCR
40.0000 meq | EXTENDED_RELEASE_TABLET | Freq: Once | ORAL | Status: AC
  Administered 2018-04-02: 40 meq via ORAL
  Filled 2018-04-02: qty 2

## 2018-04-02 MED ORDER — IOPAMIDOL (ISOVUE-370) INJECTION 76%
100.0000 mL | Freq: Once | INTRAVENOUS | Status: AC | PRN
  Administered 2018-04-02: 100 mL via INTRAVENOUS

## 2018-04-02 MED ORDER — SODIUM CHLORIDE (PF) 0.9 % IJ SOLN
INTRAMUSCULAR | Status: AC
  Filled 2018-04-02: qty 50

## 2018-04-02 NOTE — ED Notes (Signed)
Bed: WA01 Expected date:  Expected time:  Means of arrival:  Comments: EMS/Cancer

## 2018-04-02 NOTE — ED Provider Notes (Signed)
[facility] DEPT Provider Note   CSN: [id number] Arrival date & time: 04/02/18  1202     History   Chief Complaint Chief Complaint  Patient presents with  . Flank Pain   Patient is a vague historian HPI Michaela Little is a 58 y.o. female.  She complains of diffuse abdominal discomfort radiating to right flank onset approximately 4 days ago.  She denies any nausea or vomiting.  She also complains of shortness of breath which is chronic cough, however worse over the past 1 month.  She admits to missing 1 dose of Lovenox within the past week.  She denies any fever.  She treated herself with tramadol this morning which improved her pain.  She notes that her urine has a foul smell however denies dysuria or pain with urination.  HPI  Past Medical History:  Diagnosis Date  . Anxiety   . Arthritis    hands, knees, hips  . Asthma   . Bronchitis   . Cancer ([place])   . Cervical stenosis (uterine cervix)   . Disorder of appendix    Enlarged  . Dyspnea on exertion   . GERD (gastroesophageal reflux disease)   . Headache(784.0)   . History of breast cancer    2010--  LEFT  s/p  mastectomy (in [place]) AND CHEMORADIATION--  NO RECURRENCE  . History of cervical dysplasia   . Hyperlipidemia   . Hypertension   . Malignant neoplasm of overlapping sites of left breast in female, estrogen receptor positive ([place]) 03/05/2013  . OSA (obstructive sleep apnea) moderate osa per study  09/2010   CPAP  , NOT USING ON REGULAR BASIS  . Pelvic pain in female   . Positive H. pylori test    08-05-2013  . Positive TB test    AS TEEN--  [place] W/ MEDS  . Refusal of blood transfusions as patient is Jehovah's Witness   . Seasonal allergies   . Uterine fibroid   . Wears glasses     Patient Active Problem List   Diagnosis Date Noted  . Metastatic breast cancer ([place])   . Pulmonary embolism ([place]) 02/27/2018  . Acute respiratory failure with hypoxia ([place]) 01/04/2018  . Symptomatic  anemia 01/02/2018  . Pancytopenia ([place]) 01/02/2018  . Bronchitis 01/02/2018  . Patient is Jehovah's Witness   . Refusal of blood transfusions as patient is Jehovah's Witness   . Other pancytopenia ([place]) 12/24/2017  . Asthma exacerbation 10/18/2017  . High risk medication use 09/25/2017  . Lower extremity edema 09/25/2017  . S/P laparoscopic appendectomy 08/22/2017  . Goals of care, counseling/discussion 01/20/2017  . Multinodular thyroid 02/08/2016  . Lumbar back pain with radiculopathy affecting left lower extremity 02/08/2016  . Carcinoma of breast metastatic to bone ([place]) 02/08/2016  . Bone metastases ([place]) 11/13/2015  . Right thyroid nodule 06/30/2014  . Allergic rhinitis due to pollen 06/30/2014  . Essential hypertension 12/24/2013  . Left ventricular diastolic dysfunction with preserved systolic function 43/15/4008  . Lump in neck 08/11/2013  . Abdominal pain, chronic, bilateral lower quadrant 08/05/2013  . Abdominal pain, epigastric 08/05/2013  . Morbid (severe) obesity due to excess calories ([place]) 06/23/2013  . Allergic rhinitis 06/23/2013  . GERD (gastroesophageal reflux disease) 03/25/2013  . Constipation 03/25/2013  . Other and unspecified hyperlipidemia 03/25/2013  . Dysuria 03/25/2013  . Routine general medical examination at a health care facility 03/25/2013  . Malignant neoplasm of overlapping sites of left breast in female, estrogen receptor positive ([place])  03/05/2013  . Cough 10/28/2012  . Dyspnea on exertion 10/28/2012  . Chronic abdominal pain 09/02/2012  . Pelvic pain in female 02/06/2012  . Atypical chest pain 11/13/2011  . Hyperlipidemia 11/13/2011  . Dysplasia of cervix, low grade (CIN 1) 08/22/2011  . Diastolic dysfunction 69/48/5462  . Influenza 03/04/2011  . Hypertension 03/04/2011  . Obesity 03/04/2011  . OSA (obstructive sleep apnea) 02/27/2011  . Asthma 02/08/2011    Past Surgical History:  Procedure Laterality Date  . APPENDECTOMY  y  .  CARDIOVASCULAR STRESS TEST  10-29-2012   low risk perfusion study/  no significant reversibity/ ef 66%/  normal wall motion  . CERVICAL CONIZATION W/BX  2012   in  [place] N/A 05/14/2012   Procedure: LAPAROSCOPIC CHOLECYSTECTOMY;  Surgeon: Ralene Ok, MD;  Location: [facility];  Service: General;  Laterality: N/A;  . COLONOSCOPY  2011   normal per patient - [place]  . DILATION AND CURETTAGE OF UTERUS  10/15/2011   Procedure: DILATATION AND CURETTAGE;  Surgeon: Melina Schools, MD;  Location: [facility];  Service: Gynecology;  Laterality: N/A;  Conization &  endocervical curettings  . EXAMINATION UNDER ANESTHESIA N/A 08/20/2013   Procedure: EXAM UNDER ANESTHESIA;  Surgeon: Margarette Asal, MD;  Location[facility];  Service: Gynecology;  Laterality: N/A;  . [place]   right  . IR IVC FILTER PLMT / S&I /IMG GUID/MOD SED  03/01/2018  . LAPAROSCOPIC APPENDECTOMY N/A 08/22/2017   Procedure: [place];  Surgeon: Coralie Keens, MD;  Location: [facility];  Service: General;  Laterality: N/A;  . LAPAROSCOPIC ASSISTED VAGINAL HYSTERECTOMY N/A 10/26/2014   Procedure: HYSTERECTOMY ABDOMINAL ;  Surgeon: Molli Posey, MD;  Location: [facility];  Service: Gynecology;  Laterality: N/A;  . MASTECTOMY Left 11/2008  in [place]   . SALPINGOOPHORECTOMY Bilateral 10/26/2014   Procedure: BILATERAL SALPINGO OOPHORECTOMY;  Surgeon: Molli Posey, MD;  Location: [facility];  Service: Gynecology;  Laterality: Bilateral;  . TISSUE EXPANDER PLACEMENT Left 08/22/2017   Procedure: REMOVAL OF LEFT TISSUE EXPANDER;  Surgeon: Crissie Reese, MD;  Location: [facility];  Service: Plastics;  Laterality: Left;  . TISSUE EXPANDER REMOVAL Left 08/22/2017  . TRANSTHORACIC ECHOCARDIOGRAM  10-29-2012   mild lvh/  ef 60-65%/  grade II diastolic dysfunction/  trivial mr  &  tr     OB History    Gravida  10   Para  5   Term  5   Preterm      AB  5   Living  5       SAB  3   TAB  2   Ectopic      Multiple      Live Births               Home Medications    Prior to Admission medications   Medication Sig Start Date End Date Taking? Authorizing Provider  cetirizine (ZYRTEC) 10 MG tablet Take 10 mg by mouth daily. 12/19/17   [provider]  enoxaparin (LOVENOX) 120 MG/0.8ML injection Inject 0.73 mLs (110 mg total) into the skin every 12 (twelve) hours. 03/21/18 04/20/18  [name], MD  exemestane (AROMASIN) 25 MG tablet Take 1 tablet (25 mg total) by mouth daily after breakfast. 02/13/18   [name], MD  fluticasone furoate-vilanterol (BREO ELLIPTA) 100-25 MCG/INH AEPB Inhale 1 puff into the lungs daily. 10/18/17   Martyn Ehrich, NP  folic acid (FOLVITE) 1 MG tablet Take 1 tablet (1 mg total) by mouth daily. 01/05/18   Geradine Girt, DO  HYDROcodone-homatropine (HYCODAN) 5-1.5 MG/5ML syrup Take 5 mLs by mouth every 6 (six) hours as needed for cough. 03/21/18   [name], MD  ipratropium-albuterol (DUONEB) 0.5-2.5 (3) MG/3ML SOLN INHALE THREE MLS VIA NEBULIZER EVERY 6 HOURS AS NEEDED Patient taking differently: Take 3 mLs by nebulization every 6 (six) hours as needed (for wheezing or shortness of breath).  02/13/18   Clent Demark, PA-C  traMADol (ULTRAM) 50 MG tablet Take 2 tablets (100 mg total) by mouth every 6 (six) hours. 03/08/18   Cristal Ford, DO  vitamin B-12 1000 MCG tablet Take 1 tablet (1,000 mcg total) by mouth daily. 01/05/18   Geradine Girt, DO    Family History Family History  Problem Relation Age of Onset  . Breast cancer Mother   . Colon cancer Mother   . Hypotension Mother   . Asthma Mother   . Diabetes type II Mother   . Arthritis Mother   . Clotting disorder Mother   . Cancer Mother        breast/colon  . Mental illness Brother   . Heart disease Brother   . Cerebral palsy Daughter   . Emphysema Brother        never smoker  . Colon cancer Maternal Aunt   . Cancer Maternal Aunt         colon  . Colon cancer Maternal Uncle   . Cancer Maternal Uncle        colon  . Esophageal cancer Neg Hx   . Rectal cancer Neg Hx   . Stomach cancer Neg Hx   . Thyroid disease Neg Hx     Social History Social History   Tobacco Use  . Smoking status: Former Smoker    Packs/day: 0.30    Years: 10.00    Pack years: 3.00    Types: Cigarettes    Last attempt to quit: 03/26/1988    Years since quitting: 30.0  . Smokeless tobacco: Never Used  Substance Use Topics  . Alcohol use: No  . Drug use: No     Allergies   Lisinopril-hydrochlorothiazide; Adhesive [tape]; Fentanyl; Gabapentin; Hctz [hydrochlorothiazide]; Latex; Lisinopril; Losartan potassium; Other; Oxycodone; and Tums [calcium carbonate antacid]  Oxygen 4 L nasal cannula Review of Systems Review of Systems  Constitutional: Negative.   HENT: Negative.   Respiratory: Positive for shortness of breath.        Chronic dyspnea  Cardiovascular: Positive for leg swelling.       Bilateral leg swelling for several years.  Gastrointestinal: Positive for abdominal pain.  Genitourinary: Positive for flank pain.  Skin: Negative.   Allergic/Immunologic: Positive for immunocompromised state.       Being treated for breast cancer  Neurological: Negative.   Psychiatric/Behavioral: Negative.   All other systems reviewed and are negative.    Physical Exam Updated Vital Signs BP 136/71 (BP Location: Right Arm)   Pulse 81   Temp 98.7 F (37.1 C) (Oral)   Resp (!) 21   Ht 5\' 7"  (1.702 m)   Wt 104.8 kg   LMP 02/25/2009   SpO2 100%   BMI 36.18 kg/m   Physical Exam Vitals signs and nursing note reviewed.  Constitutional:      General: She is not in acute distress.    Appearance: She is well-developed. She is obese.     Comments: Chronically  ill-appearing  HENT:     Head: Normocephalic and atraumatic.  Eyes:     Conjunctiva/sclera: Conjunctivae normal.     Pupils: Pupils are equal, round, and reactive to light.  Neck:      Musculoskeletal: Neck supple.     Thyroid: No thyromegaly.     Trachea: No tracheal deviation.  Cardiovascular:     Rate and Rhythm: Normal rate and regular rhythm.     Heart sounds: No murmur.  Pulmonary:     Effort: Pulmonary effort is normal.     Breath sounds: Normal breath sounds.  Abdominal:     General: Bowel sounds are normal. There is no distension.     Palpations: Abdomen is soft.     Tenderness: There is no abdominal tenderness.     Comments: Obese, mild diffuse tenderness  Genitourinary:    Comments: No flank tenderness Musculoskeletal: Normal range of motion.        General: No tenderness.     Right lower leg: Edema present.     Left lower leg: Edema present.     Comments: 2Plus pretibial pitting edema bilaterally  Skin:    General: Skin is warm and dry.     Findings: No rash.  Neurological:     Mental Status: She is alert.     Coordination: Coordination normal.      ED Treatments / Results  Labs (all labs ordered are listed, but only abnormal results are displayed) Labs Reviewed  URINALYSIS, ROUTINE W REFLEX MICROSCOPIC    EKG None  Radiology No results found.  Procedures Procedures (including critical care time)  Medications Ordered in ED Medications - No data to display Results for orders placed or performed during the hospital encounter of 04/02/18  Urinalysis, Routine w reflex microscopic- may I&O cath if menses  Result Value Ref Range   Color, Urine YELLOW YELLOW   APPearance HAZY (A) CLEAR   Specific Gravity, Urine 1.012 1.005 - 1.030   pH 6.0 5.0 - 8.0   Glucose, UA NEGATIVE NEGATIVE mg/dL   Hgb urine dipstick NEGATIVE NEGATIVE   Bilirubin Urine NEGATIVE NEGATIVE   Ketones, ur NEGATIVE NEGATIVE mg/dL   Protein, ur NEGATIVE NEGATIVE mg/dL   Nitrite NEGATIVE NEGATIVE   Leukocytes, UA SMALL (A) NEGATIVE   RBC / HPF 0-5 0 - 5 RBC/hpf   WBC, UA 6-10 0 - 5 WBC/hpf   Bacteria, UA RARE (A) NONE SEEN   Squamous Epithelial / LPF 0-5 0 - 5    Mucus PRESENT    Crystals PRESENT (A) NEGATIVE  CBC with Differential/Platelet  Result Value Ref Range   WBC 3.7 (L) 4.0 - 10.5 K/uL   RBC 1.87 (L) 3.87 - 5.11 MIL/uL   Hemoglobin 5.3 (LL) 12.0 - 15.0 g/dL   HCT 18.9 (L) 36.0 - 46.0 %   MCV 101.1 (H) 80.0 - 100.0 fL   MCH 28.3 26.0 - 34.0 pg   MCHC 28.0 (L) 30.0 - 36.0 g/dL   RDW 19.8 (H) 11.5 - 15.5 %   Platelets 107 (L) 150 - 400 K/uL   nRBC 10.5 (H) 0.0 - 0.2 %   Neutrophils Relative % 57 %   Neutro Abs 2.1 1.7 - 7.7 K/uL   Lymphocytes Relative 27 %   Lymphs Abs 1.0 0.7 - 4.0 K/uL   Monocytes Relative 8 %   Monocytes Absolute 0.3 0.1 - 1.0 K/uL   Eosinophils Relative 2 %   Eosinophils Absolute 0.1 0.0 - 0.5 K/uL  Basophils Relative 0 %   Basophils Absolute 0.0 0.0 - 0.1 K/uL   WBC Morphology MILD LEFT SHIFT (1-5% METAS, OCC MYELO, OCC BANDS)    Immature Granulocytes 6 %   Abs Immature Granulocytes 0.23 (H) 0.00 - 0.07 K/uL   Tear Drop Cells PRESENT    Polychromasia PRESENT   Comprehensive metabolic panel  Result Value Ref Range   Sodium 141 135 - 145 mmol/L   Potassium 3.2 (L) 3.5 - 5.1 mmol/L   Chloride 105 98 - 111 mmol/L   CO2 26 22 - 32 mmol/L   Glucose, Bld 150 (H) 70 - 99 mg/dL   BUN 10 6 - 20 mg/dL   Creatinine, Ser 0.72 0.44 - 1.00 mg/dL   Calcium 10.5 (H) 8.9 - 10.3 mg/dL   Total Protein 7.4 6.5 - 8.1 g/dL   Albumin 3.7 3.5 - 5.0 g/dL   AST 26 15 - 41 U/L   ALT 15 0 - 44 U/L   Alkaline Phosphatase 90 38 - 126 U/L   Total Bilirubin 1.3 (H) 0.3 - 1.2 mg/dL   GFR calc non Af Amer >60 >60 mL/min   GFR calc Af Amer >60 >60 mL/min   Anion gap 10 5 - 15   Ct Angio Chest Pe W And/or Wo Contrast  Result Date: 04/02/2018 CLINICAL DATA:  58 year old female with history of bilateral flank pain and foul-smelling urine. History of metastatic breast cancer. Shortness of breath. EXAM: CT ANGIOGRAPHY CHEST CT ABDOMEN AND PELVIS WITH CONTRAST TECHNIQUE: Multidetector CT imaging of the chest was performed using the  standard protocol during bolus administration of intravenous contrast. Multiplanar CT image reconstructions and MIPs were obtained to evaluate the vascular anatomy. Multidetector CT imaging of the abdomen and pelvis was performed using the standard protocol during bolus administration of intravenous contrast. CONTRAST:  118mL ISOVUE-370 IOPAMIDOL (ISOVUE-370) INJECTION 76% COMPARISON:  PET-CT 01/15/2018. CT the chest, abdomen and pelvis 08/27/2017. FINDINGS: CTA CHEST FINDINGS Cardiovascular: No filling defects in the pulmonary arterial tree to suggest underlying pulmonary embolism. Heart size is mildly enlarged. There is no significant pericardial fluid, thickening or pericardial calcification. Aortic atherosclerosis. No definite coronary artery calcifications. Mediastinum/Nodes: No pathologically enlarged mediastinal or hilar lymph nodes. Esophagus is unremarkable in appearance. No axillary lymphadenopathy. Numerous surgical clips in the left axillary region from prior lymph node dissection. Lungs/Pleura: No suspicious appearing pulmonary nodules or masses are noted. No acute consolidative airspace disease. No pleural effusions. Musculoskeletal: Status post left radical mastectomy and left axillary lymph node dissection. Innumerable mixed lytic and sclerotic lesions throughout the visualized axial and appendicular skeleton, compatible with widespread metastatic disease to the bones, progressive compared to the prior study from 08/27/2017. Review of the MIP images confirms the above findings. CT ABDOMEN and PELVIS FINDINGS Hepatobiliary: There are several small low-attenuation lesions scattered throughout the liver, at least 3 of which are new compared to prior studies and suspicious for metastatic lesions. The largest of these include a 12 x 14 mm lesion in segment 5 (axial image 31 of series 2), and a 15 x 12 mm lesion in the periphery of segment 8 (axial image 18 of series 2). No intra or extrahepatic biliary  ductal dilatation. Status post cholecystectomy. Pancreas: No pancreatic mass. No pancreatic ductal dilatation. No pancreatic or peripancreatic fluid or inflammatory changes. Spleen: Unremarkable. Adrenals/Urinary Tract: 2.3 cm low-attenuation lesion in the lower pole of the right kidney, compatible with a simple cyst. Left kidney and bilateral adrenal glands are normal in appearance. No hydroureteronephrosis.  Urinary bladder is normal in appearance. Stomach/Bowel: Normal appearance of the stomach. No pathologic dilatation of small bowel or colon. Normal appendix. Vascular/Lymphatic: Aortic atherosclerosis, without evidence of aneurysm or dissection in the abdominal or pelvic vasculature. IVC filter in position with tip terminating immediately beneath the level of the renal veins. Large filling defect extending from the right common femoral vein into the proximal right external iliac vein (axial images 59-79 of series 2), compatible with a large deep venous thrombosis. Reproductive: Status post hysterectomy. Ovaries are not confidently identified may be surgically absent or atrophic. Other: No significant volume of ascites.  No pneumoperitoneum. Musculoskeletal: Innumerable mixed lytic and sclerotic lesions are noted throughout the visualized axial and appendicular skeleton, compatible with widespread metastatic disease to the bones, progressive compared to prior examinations. Review of the MIP images confirms the above findings. IMPRESSION: 1. Multiple new hepatic lesions highly concerning for developing metastatic disease to the liver. This could be confirmed with nonemergent MRI of the abdomen with and without IV gadolinium if clinically appropriate. 2. Widespread metastatic disease to the bones also appears progressive compared to prior examinations. 3. Large right-sided pelvic deep venous thrombosis extending from the right common femoral vein to the proximal right external iliac vein. IVC filter is in position.  No clot identified in the IVC filter at this time. No pulmonary embolism on today's examination. 4. Aortic atherosclerosis. 5. Additional incidental findings, as above. Electronically Signed   By: Vinnie Langton M.D.   On: 04/02/2018 15:05   Mr Pelvis Wo Contrast  Result Date: 03/05/2018 CLINICAL DATA:  Right hip pain and right groin pain. EXAM: MRI PELVIS WITHOUT CONTRAST TECHNIQUE: Multiplanar multisequence MR imaging of the pelvis was performed. No intravenous contrast was administered. COMPARISON:  PET-CT dated 01/15/2018 FINDINGS: Urinary Tract:  No abnormality visualized. Bowel:  Unremarkable visualized pelvic bowel loops. Vascular/Lymphatic: Probable acute thrombosis of the right femoral vein and external iliac vein and superficial veins in the anterior aspect of the proximal right thigh. No visible adenopathy. Reproductive:  Hysterectomy.  No adnexal masses. Musculoskeletal: Diffuse patchy abnormal signal from all of the visualized bones consistent with either reactivated red marrow or less likely diffuse metastatic disease to the marrow. Extensive subcutaneous and muscle edema in the proximal right thigh felt to be due to the venous thrombosis. IMPRESSION: Probable acute thrombosis of the right femoral vein and right external iliac vein and superficial veins of the anterior aspect of the right thigh with secondary muscle and subcutaneous edema. Electronically Signed   By: Lorriane Shire M.D.   On: 03/05/2018 15:23   Ct Abdomen Pelvis W Contrast  Result Date: 04/02/2018 CLINICAL DATA:  58 year old female with history of bilateral flank pain and foul-smelling urine. History of metastatic breast cancer. Shortness of breath. EXAM: CT ANGIOGRAPHY CHEST CT ABDOMEN AND PELVIS WITH CONTRAST TECHNIQUE: Multidetector CT imaging of the chest was performed using the standard protocol during bolus administration of intravenous contrast. Multiplanar CT image reconstructions and MIPs were obtained to evaluate  the vascular anatomy. Multidetector CT imaging of the abdomen and pelvis was performed using the standard protocol during bolus administration of intravenous contrast. CONTRAST:  142mL ISOVUE-370 IOPAMIDOL (ISOVUE-370) INJECTION 76% COMPARISON:  PET-CT 01/15/2018. CT the chest, abdomen and pelvis 08/27/2017. FINDINGS: CTA CHEST FINDINGS Cardiovascular: No filling defects in the pulmonary arterial tree to suggest underlying pulmonary embolism. Heart size is mildly enlarged. There is no significant pericardial fluid, thickening or pericardial calcification. Aortic atherosclerosis. No definite coronary artery calcifications. Mediastinum/Nodes: No pathologically enlarged mediastinal  or hilar lymph nodes. Esophagus is unremarkable in appearance. No axillary lymphadenopathy. Numerous surgical clips in the left axillary region from prior lymph node dissection. Lungs/Pleura: No suspicious appearing pulmonary nodules or masses are noted. No acute consolidative airspace disease. No pleural effusions. Musculoskeletal: Status post left radical mastectomy and left axillary lymph node dissection. Innumerable mixed lytic and sclerotic lesions throughout the visualized axial and appendicular skeleton, compatible with widespread metastatic disease to the bones, progressive compared to the prior study from 08/27/2017. Review of the MIP images confirms the above findings. CT ABDOMEN and PELVIS FINDINGS Hepatobiliary: There are several small low-attenuation lesions scattered throughout the liver, at least 3 of which are new compared to prior studies and suspicious for metastatic lesions. The largest of these include a 12 x 14 mm lesion in segment 5 (axial image 31 of series 2), and a 15 x 12 mm lesion in the periphery of segment 8 (axial image 18 of series 2). No intra or extrahepatic biliary ductal dilatation. Status post cholecystectomy. Pancreas: No pancreatic mass. No pancreatic ductal dilatation. No pancreatic or peripancreatic  fluid or inflammatory changes. Spleen: Unremarkable. Adrenals/Urinary Tract: 2.3 cm low-attenuation lesion in the lower pole of the right kidney, compatible with a simple cyst. Left kidney and bilateral adrenal glands are normal in appearance. No hydroureteronephrosis. Urinary bladder is normal in appearance. Stomach/Bowel: Normal appearance of the stomach. No pathologic dilatation of small bowel or colon. Normal appendix. Vascular/Lymphatic: Aortic atherosclerosis, without evidence of aneurysm or dissection in the abdominal or pelvic vasculature. IVC filter in position with tip terminating immediately beneath the level of the renal veins. Large filling defect extending from the right common femoral vein into the proximal right external iliac vein (axial images 59-79 of series 2), compatible with a large deep venous thrombosis. Reproductive: Status post hysterectomy. Ovaries are not confidently identified may be surgically absent or atrophic. Other: No significant volume of ascites.  No pneumoperitoneum. Musculoskeletal: Innumerable mixed lytic and sclerotic lesions are noted throughout the visualized axial and appendicular skeleton, compatible with widespread metastatic disease to the bones, progressive compared to prior examinations. Review of the MIP images confirms the above findings. IMPRESSION: 1. Multiple new hepatic lesions highly concerning for developing metastatic disease to the liver. This could be confirmed with nonemergent MRI of the abdomen with and without IV gadolinium if clinically appropriate. 2. Widespread metastatic disease to the bones also appears progressive compared to prior examinations. 3. Large right-sided pelvic deep venous thrombosis extending from the right common femoral vein to the proximal right external iliac vein. IVC filter is in position. No clot identified in the IVC filter at this time. No pulmonary embolism on today's examination. 4. Aortic atherosclerosis. 5. Additional  incidental findings, as above. Electronically Signed   By: Vinnie Langton M.D.   On: 04/02/2018 15:05   Mr Hip Right Wo Contrast  Result Date: 03/05/2018 CLINICAL DATA:  Right hip pain. Right groin pain. History of metastatic breast cancer. EXAM: MR OF THE RIGHT HIP WITHOUT CONTRAST TECHNIQUE: Multiplanar, multisequence MR imaging was performed. No intravenous contrast was administered. COMPARISON:  Radiographs dated 03/03/2018 and PET-CT dated 01/15/2018 and CT scan of the abdomen and pelvis dated 08/27/2017 FINDINGS: Other findings Miscellaneous: There's extensive thrombosis of the right external iliac and right common femoral vein as well as superficial veins in the proximal right thigh. There's prominent subcutaneous and muscle edema in right thigh primarily in the anterior and lateral aspects. Bones: There's diffuse abnormal signal from all of the bones consistent with  either diffuse red marrow reactivation or diffuse metastatic disease to the marrow. There's no bone destruction. No fractures. Articular cartilage and labrum Articular cartilage:  Normal. Labrum:  Normal. Joint or bursal effusion Joint effusion:  None Bursae: Normal. Muscles and tendons Muscles and tendons: Prominent edema in the muscles of the anterior and lateral aspects of the right proximal thigh and hip with adjacent subcutaneous edema. This is felt to be due to acute venous thrombosis as described above. IMPRESSION: 1. Probable acute thrombosis of the right common femoral vein and right external iliac vein as well as adjacent superficial veins in the proximal right thigh. 2. Diffuse abnormal marrow signal as described on the prior PET-CT scan. This could represent red marrow reactivation or diffuse metastatic disease. 3. No acute bone abnormality. Critical Value/emergent results were called by telephone at the time of interpretation on 03/05/2018 at 2:30 pm to Dr. Sabra Heck, who verbally acknowledged these results. Electronically Signed    By: Lorriane Shire M.D.   On: 03/05/2018 14:36   Dg Chest Port 1 View  Result Date: 03/08/2018 CLINICAL DATA:  Cough. EXAM: PORTABLE CHEST 1 VIEW COMPARISON:  Radiographs of February 27, 2018. FINDINGS: The heart size and mediastinal contours are within normal limits. Both lungs are clear. No pneumothorax or pleural effusion is noted. The visualized skeletal structures are unremarkable. IMPRESSION: No active disease. Electronically Signed   By: Marijo Conception, M.D.   On: 03/08/2018 15:58   Dg Hip Unilat With Pelvis 2-3 Views Right  Result Date: 03/03/2018 CLINICAL DATA:  Chronic right hip pain EXAM: DG HIP (WITH OR WITHOUT PELVIS) 2-3V RIGHT COMPARISON:  04/17/2015 FINDINGS: SI joints are non widened. Pubic symphysis and rami are intact. No fracture or malalignment. Mild to moderate degenerative change of the right hip. 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 No acute osseous abnormality. Electronically Signed   By: Donavan Foil M.D.   On: 03/03/2018 21:00    Initial Impression / Assessment and Plan / ED Course  I have reviewed the triage vital signs and the nursing notes.  Pertinent labs & imaging results that were available during my care of the patient were reviewed by me and considered in my medical decision making (see chart for details).     Patient's pain has been well controlled with tramadol formally prescribed to her.  Lab work consistent with mild hypokalemia, and profound anemia, and thrombocytopenia otherwise normal.  Hemoglobin and platelet count is somewhat improved over December 19, 2017. Patient is Jehovah's Witness and has refused transfusions, including today.  I do not feel that she needs emergent transfusion regardless, she is not symptomatic from this longstanding anemia.  I discussed case with Dr.FEng.  Including CT scans.  No further treatment needed.  She received oral potassium supplementation here.  Her pain is well controlled.  Cancer therapy had  been temporarily discontinued.  She has upcoming appoint with Dr.Feng April 04, 2018 which she is encouraged to keep Final Clinical Impressions(s) / ED Diagnoses  Diagnoses #1 metastatic cancer #2 anemia #3 thrombocytopenia #4 hypokalemia Final diagnoses:  None    ED Discharge Orders    None       Orlie Dakin, MD 04/02/18 1559

## 2018-04-02 NOTE — ED Notes (Signed)
Patient endorses intermittent chest pain, placed on cardiac monitor.  12 lead performed.

## 2018-04-02 NOTE — ED Notes (Signed)
ED Provider at bedside. 

## 2018-04-02 NOTE — Discharge Instructions (Signed)
Keep your scheduled appointment with Dr.Feng  for 04/04/2018

## 2018-04-02 NOTE — ED Notes (Signed)
Date and time results received: 04/02/18 1334 (use smartphrase ".now" to insert current time)  Test: Hgb Critical Value: 5.3  Name of Provider Notified: Dr. Winfred Leeds  Orders Received? Or Actions Taken?:

## 2018-04-02 NOTE — ED Triage Notes (Signed)
Per EMS patient with UTI symptoms x 4 days.  Reports bilateral flank pain, foul smelling urine.

## 2018-04-02 NOTE — Telephone Encounter (Signed)
Call placed to patient to notify her of PCP's recommendation to have PCS form completed by Oncologist or new provider. Pt verbalized understanding and shared that she has an upcoming appointment to establish care with new PCP on 04/10/2018. Pt was strongly encouraged to contact Lindy with any questions or concerns that may arise.

## 2018-04-03 ENCOUNTER — Telehealth: Payer: Self-pay | Admitting: Hematology

## 2018-04-03 NOTE — Telephone Encounter (Signed)
Called patient per 1/8 sch message - pt is aware of appt date and times CT cancelled per sch message.

## 2018-04-04 ENCOUNTER — Inpatient Hospital Stay: Payer: Medicare Other | Attending: Hematology

## 2018-04-04 ENCOUNTER — Inpatient Hospital Stay (HOSPITAL_BASED_OUTPATIENT_CLINIC_OR_DEPARTMENT_OTHER): Payer: Medicare Other | Admitting: Hematology

## 2018-04-04 ENCOUNTER — Encounter: Payer: Self-pay | Admitting: Hematology

## 2018-04-04 ENCOUNTER — Inpatient Hospital Stay: Payer: Medicare Other

## 2018-04-04 ENCOUNTER — Ambulatory Visit (HOSPITAL_COMMUNITY): Payer: Medicare Other

## 2018-04-04 VITALS — BP 153/70 | HR 91 | Temp 98.3°F | Resp 17 | Ht 67.0 in | Wt 243.4 lb

## 2018-04-04 VITALS — BP 153/70 | HR 91 | Temp 98.3°F | Resp 17

## 2018-04-04 DIAGNOSIS — Z170 Estrogen receptor positive status [ER+]: Secondary | ICD-10-CM | POA: Diagnosis not present

## 2018-04-04 DIAGNOSIS — C785 Secondary malignant neoplasm of large intestine and rectum: Secondary | ICD-10-CM | POA: Diagnosis not present

## 2018-04-04 DIAGNOSIS — C7951 Secondary malignant neoplasm of bone: Secondary | ICD-10-CM

## 2018-04-04 DIAGNOSIS — I82401 Acute embolism and thrombosis of unspecified deep veins of right lower extremity: Secondary | ICD-10-CM | POA: Diagnosis not present

## 2018-04-04 DIAGNOSIS — Z7951 Long term (current) use of inhaled steroids: Secondary | ICD-10-CM | POA: Diagnosis not present

## 2018-04-04 DIAGNOSIS — I2699 Other pulmonary embolism without acute cor pulmonale: Secondary | ICD-10-CM | POA: Insufficient documentation

## 2018-04-04 DIAGNOSIS — R0781 Pleurodynia: Secondary | ICD-10-CM

## 2018-04-04 DIAGNOSIS — N179 Acute kidney failure, unspecified: Secondary | ICD-10-CM | POA: Insufficient documentation

## 2018-04-04 DIAGNOSIS — Z7189 Other specified counseling: Secondary | ICD-10-CM

## 2018-04-04 DIAGNOSIS — C50812 Malignant neoplasm of overlapping sites of left female breast: Secondary | ICD-10-CM

## 2018-04-04 DIAGNOSIS — D649 Anemia, unspecified: Secondary | ICD-10-CM

## 2018-04-04 DIAGNOSIS — E876 Hypokalemia: Secondary | ICD-10-CM | POA: Insufficient documentation

## 2018-04-04 DIAGNOSIS — R0600 Dyspnea, unspecified: Secondary | ICD-10-CM

## 2018-04-04 DIAGNOSIS — R05 Cough: Secondary | ICD-10-CM

## 2018-04-04 LAB — URINE CULTURE: Culture: <10000 — AB

## 2018-04-04 MED ORDER — DARBEPOETIN ALFA 500 MCG/ML IJ SOSY
PREFILLED_SYRINGE | INTRAMUSCULAR | Status: AC
  Filled 2018-04-04: qty 1

## 2018-04-04 MED ORDER — DARBEPOETIN ALFA 500 MCG/ML IJ SOSY
500.0000 ug | PREFILLED_SYRINGE | Freq: Once | INTRAMUSCULAR | Status: AC
  Administered 2018-04-04: 500 ug via SUBCUTANEOUS

## 2018-04-04 NOTE — Patient Instructions (Signed)

## 2018-04-04 NOTE — Progress Notes (Signed)
[place]   [phone] [phone]   Clinic Follow up Note   Patient Care Team: [name] as PCP - General (Physician Assistant) [name], MD as Consulting Physician (Pulmonary Disease) [name], MD as Consulting Physician (Hematology) [name], NP as Nurse Practitioner (Hospice and Palliative Medicine)  Date of Service:  04/04/2018  CHIEF COMPLAINT: F/u of metastatic breast cancer and severe anemia   SUMMARY OF ONCOLOGIC HISTORY: Oncology History   Cancer Staging Malignant neoplasm of overlapping sites of left breast in female, estrogen receptor positive ([place]) Staging form: Breast, AJCC 7th Edition - Clinical: Stage IIIA (T2, N2, M0) - Unsigned       Malignant neoplasm of overlapping sites of left breast in female, estrogen receptor positive ([place])   05/2008 Cancer Diagnosis    Left sided breast cancer (no path report available)    11/2008 Pathologic Stage    Stage IIIA: T2 N2     Neo-Adjuvant Chemotherapy    Paclitaxel weekly x 12; doxorubicin and cyclophosphamide x 4 - both given with trifiparnib (treated at [facility] in [place])    11/2008 Definitive Surgery    Left modified radiation mastectomy: invasive lobular carcinoma, ER+, PR+, HER2/neu negative, 5/22 LN positive     - 03/2009 Radiation Therapy    Adjuvant radiation to left chest wall    2011 - 08/2014 Anti-estrogen oral therapy    Tamoxifen 20 mg used briefly; discontinued due to uterine lining concerns; changed to anastrozole 1 mg daily (began 07/2100); discontinued by patient summer of 2016    05/26/2015 Progression    Iliac bone biopsy showed metastatic carcinoma consistent with a breast primary, ER positive, PR and HER-2 negative. Her staging CT, and a PET scan was negative for visceral metastasis.    05/2015 - 01/2018 Anti-estrogen oral therapy    1. letrozole and Ibrance started March 2017             -Ibrance dose decreased to 75 mg daily.  Stopped in 11/2017 due to anemia   -Letrozole switched to Exemestane on 02/13/17 due to b/l rib pain. She letrozole herself in 01/2018.      11/16/2015 Miscellaneous    Xgeva monthly for bone metastasis     07/03/2016 Imaging    CT chest, abdomen and pelvis with contrast showed no evidence of metastasis.     02/07/2017 Imaging    CT AP W Contrast 02/07/17 IMPRESSION: 1. No CT findings for abdominal/pelvic metastatic disease. 2. No acute abdominal findings, mass lesions or adenopathy. 3. Status post cholecystectomy with mild associated common bile duct dilatation. 4. Somewhat thickened appendix appears relatively stable. No acute inflammatory process. 5. Stable mixed lytic and sclerotic osseous metastatic disease.     02/07/2017 Imaging    Bone Scan Whole Body 02/07/17 IMPRESSION: No scintigraphic evidence skeletal metastasis     08/12/2017 Imaging    Whole Boday Scan 08/12/17 IMPRESSION: Scattered degenerative type uptake as above with a questionable focus of increased tracer localization at L2 vertebral body versus artifact; no abnormality is seen at this site by CT. Consider either characterization by MR or attention on follow-up imaging.    08/12/2017 Imaging    CT AP W Contrast 08/12/17  IMPRESSION: Continued thickening of the appendix is noted without surrounding inflammation. It has maximum measured diameter is decreased compared to prior exam. There is no evidence of acute inflammation. Stable mixed lytic and sclerotic appearance of visualized skeleton is noted consistent with history of  known osseous metastases. No acute abnormality seen in the abdomen or pelvis.    08/22/2017 Surgery    APPENDECTOMY LAPAROSCOPIC ERAS PATHWAY by Dr. Malcolm Metro and REMOVAL OF LEFT TISSUE EXPANDER by Dr. Harlow Mares 08/22/17    08/22/2017 Pathology Results    Diagnosis 08/22/17  1. Appendix, Other than Incidental - METASTATIC CARCINOMA, CONSISTENT WITH BREAST PRIMARY. - CARCINOMA IS PRESENT  AT THE SURGICAL RESECTION MARGIN AS WELL AS THE SEROSAL SURFACE. - LYMPHOVASCULAR INVASION IS IDENTIFIED. - SEE COMMENT. 2. Breast, capsule, Left - DENSE FIBROUS TISSUE WITH MILD INFLAMMATION, INCLUDING A FEW SCATTERED MULTINUCLEATED GIANT CELLS. - THERE IS NO EVIDENCE OF MALIGNANCY.    08/27/2017 Imaging    CT AP W Contrast 08/27/17 IMPRESSION: No evidence of pulmonary embolus. Small pericardial effusion with uncertain clinical significance. No evidence of acute abnormalities within the chest, abdomen or pelvis. Area of fat stranding and small focus of gas within the anterior abdominal wall, immediately inferior to the umbilicus, probably at the site of laparoscopic access.    10/07/2017 Echocardiogram    10/07/2017 ECO Study Conclusions  - Procedure narrative: Transthoracic echocardiography. Image   quality was adequate. The study was technically difficult, as a   result of poor acoustic windows. - Left ventricle: The cavity size was normal. Wall thickness was   normal. Systolic function was normal. The estimated ejection   fraction was in the range of 60% to 65%. Wall motion was normal;   there were no regional wall motion abnormalities. Features are   consistent with a pseudonormal left ventricular filling pattern,   with concomitant abnormal relaxation and increased filling   pressure (grade 2 diastolic dysfunction).    01/16/2018 PET scan    01/16/2018 PET Scan IMPRESSION: 1. Most striking finding is intense marrow activity diffusely throughout the axillary and appendicular skeleton. This is favored a physiologic marrow response from chronic anemia rather than diffuse metastatic disease. 2. Several discrete foci of more intense activity noted in the spine which could indicate malignancy or trauma. Difficult to define malignancy on the background of diffuse marrow activity. 3. Diffuse sclerosis throughout the bones similar to a recent CT scans but new from remote PET-CT  scan 03/01/2016. 4. No evidence soft tissue metastasis.     04/02/2018 Imaging    CT Angio CAP 04/02/18  IMPRESSION: 1. Multiple new hepatic lesions highly concerning for developing metastatic disease to the liver. This could be confirmed with nonemergent MRI of the abdomen with and without IV gadolinium if clinically appropriate. 2. Widespread metastatic disease to the bones also appears progressive compared to prior examinations. 3. Large right-sided pelvic deep venous thrombosis extending from the right common femoral vein to the proximal right external iliac vein. IVC filter is in position. No clot identified in the IVC filter at this time. No pulmonary embolism on today's examination. 4. Aortic atherosclerosis. 5. Additional incidental findings, as above.     Anti-estrogen oral therapy    Pending Fulvestrant injections monthly       CURRENT THERAPY:  -Aranesp injection every 2 weeks -B12 injections monthly  -Lovenox 177m q12h  -plan to start fulvestrant injection in 2 weeks  INTERVAL HISTORY:  COffice Depotis here for a follow up of anemia and metastatic breast cancer. She presents to the clinic today with her friends. She notes presenting to ED on 04/02/17 for ribcage and back pain which was significant. She notes her cough is still intermittent. She notes she continues to eat. She notes her right legs is  swollen from DVT. She notes she will think about DNR and POA. Her family will be here this month to discuss this.     REVIEW OF SYSTEMS:   Constitutional: Denies fevers, chills or abnormal weight loss Eyes: Denies blurriness of vision Ears, nose, mouth, throat, and face: Denies mucositis or sore throat Respiratory: Denies dyspnea or wheezes (+) Cough  Cardiovascular: Denies palpitation, chest discomfort (+) right leg swelling Gastrointestinal:  Denies nausea, heartburn or change in bowel habits Skin: Denies abnormal skin rashes Lymphatics: Denies new lymphadenopathy  or easy bruising Neurological:Denies numbness, tingling or new weaknesses Behavioral/Psych: Mood is stable, no new changes  All other systems were reviewed with the patient and are negative.  MEDICAL HISTORY:  Past Medical History:  Diagnosis Date  . Anxiety   . Arthritis    hands, knees, hips  . Asthma   . Bronchitis   . Cancer ([place])   . Cervical stenosis (uterine cervix)   . Disorder of appendix    Enlarged  . Dyspnea on exertion   . GERD (gastroesophageal reflux disease)   . Headache(784.0)   . History of breast cancer    2010--  LEFT  s/p  mastectomy (in [place]) AND CHEMORADIATION--  NO RECURRENCE  . History of cervical dysplasia   . Hyperlipidemia   . Hypertension   . Malignant neoplasm of overlapping sites of left breast in female, estrogen receptor positive ([place]) 03/05/2013  . OSA (obstructive sleep apnea) moderate osa per study  09/2010   CPAP  , NOT USING ON REGULAR BASIS  . Pelvic pain in female   . Positive H. pylori test    08-05-2013  . Positive TB test    AS TEEN--  [place] W/ MEDS  . Refusal of blood transfusions as patient is Jehovah's Witness   . Seasonal allergies   . Uterine fibroid   . Wears glasses     SURGICAL HISTORY: Past Surgical History:  Procedure Laterality Date  . APPENDECTOMY  y  . CARDIOVASCULAR STRESS TEST  10-29-2012   low risk perfusion study/  no significant reversibity/ ef 66%/  normal wall motion  . CERVICAL CONIZATION W/BX  2012   in  [place] N/A 05/14/2012   Procedure: LAPAROSCOPIC CHOLECYSTECTOMY;  Surgeon: Ralene Ok, MD;  Location: [facility];  Service: General;  Laterality: N/A;  . COLONOSCOPY  2011   normal per patient - [place]  . DILATION AND CURETTAGE OF UTERUS  10/15/2011   Procedure: DILATATION AND CURETTAGE;  Surgeon: Melina Schools, MD;  Location: [facility];  Service: Gynecology;  Laterality: N/A;  Conization &  endocervical curettings  . EXAMINATION UNDER ANESTHESIA N/A 08/20/2013   Procedure: EXAM UNDER ANESTHESIA;   Surgeon: Margarette Asal, MD;  Location: [facility];  Service: Gynecology;  Laterality: N/A;  . [place]   right  . IR IVC FILTER PLMT / S&I /IMG GUID/MOD SED  03/01/2018  . LAPAROSCOPIC APPENDECTOMY N/A 08/22/2017   Procedure: [place];  Surgeon: Coralie Keens, MD;  Location: [facility];  Service: General;  Laterality: N/A;  . LAPAROSCOPIC ASSISTED VAGINAL HYSTERECTOMY N/A 10/26/2014   Procedure: HYSTERECTOMY ABDOMINAL ;  Surgeon: Molli Posey, MD;  Location: [facility];  Service: Gynecology;  Laterality: N/A;  . MASTECTOMY Left 11/2008  in [place]   . SALPINGOOPHORECTOMY Bilateral 10/26/2014   Procedure: BILATERAL SALPINGO OOPHORECTOMY;  Surgeon: Molli Posey, MD;  Location: [facility];  Service: Gynecology;  Laterality: Bilateral;  . TISSUE EXPANDER PLACEMENT Left 08/22/2017   Procedure: REMOVAL OF LEFT TISSUE EXPANDER;  Surgeon: Crissie Reese, MD;  Location: [facility];  Service: Plastics;  Laterality: Left;  . TISSUE EXPANDER REMOVAL Left 08/22/2017  . TRANSTHORACIC ECHOCARDIOGRAM  10-29-2012   mild lvh/  ef 60-65%/  grade II diastolic dysfunction/  trivial mr  &  tr    I have reviewed the social history and family history with the patient and they are unchanged from previous note.  ALLERGIES:  is allergic to lisinopril-hydrochlorothiazide; adhesive [tape]; fentanyl; gabapentin; hctz [hydrochlorothiazide]; latex; lisinopril; losartan potassium; other; oxycodone; and tums [calcium carbonate antacid].  MEDICATIONS:  Current Outpatient Medications  Medication Sig Dispense Refill  . cetirizine (ZYRTEC) 10 MG tablet Take 10 mg by mouth daily.  11  . enoxaparin (LOVENOX) 120 MG/0.8ML injection Inject 0.73 mLs (110 mg total) into the skin every 12 (twelve) hours. 43.8 mL 2  . exemestane (AROMASIN) 25 MG tablet Take 1 tablet (25 mg total) by mouth daily after breakfast. 90 tablet 1  . fluticasone furoate-vilanterol (BREO ELLIPTA)  100-25 MCG/INH AEPB Inhale 1 puff into the lungs daily. 28 each 5  . folic acid (FOLVITE) 1 MG tablet Take 1 tablet (1 mg total) by mouth daily. 30 tablet 0  . HYDROcodone-homatropine (HYCODAN) 5-1.5 MG/5ML syrup Take 5 mLs by mouth every 6 (six) hours as needed for cough. 120 mL 0  . ipratropium-albuterol (DUONEB) 0.5-2.5 (3) MG/3ML SOLN INHALE THREE MLS VIA NEBULIZER EVERY 6 HOURS AS NEEDED (Patient taking differently: Take 3 mLs by nebulization every 6 (six) hours as needed (for wheezing or shortness of breath). ) 360 mL 5  . traMADol (ULTRAM) 50 MG tablet Take 2 tablets (100 mg total) by mouth every 6 (six) hours. 30 tablet 0  . vitamin B-12 1000 MCG tablet Take 1 tablet (1,000 mcg total) by mouth daily. 30 tablet 0   No current facility-administered medications for this visit.    Facility-Administered Medications Ordered in Other Visits  Medication Dose Route Frequency Provider Last Rate Last Dose  . 0.9 %  sodium chloride infusion   Intravenous Once [name], MD        PHYSICAL EXAMINATION: ECOG PERFORMANCE STATUS: 3 - Symptomatic, >50% confined to bed  Vitals:   04/04/18 1053  BP: (!) 153/70  Pulse: 91  Resp: 17  Temp: 98.3 F (36.8 C)  SpO2: 100%   Filed Weights   04/04/18 1053  Weight: 243 lb 6.4 oz (110.4 kg)    GENERAL:alert, no distress and comfortable SKIN: skin color, texture, turgor are normal, no rashes or significant lesions EYES: normal, Conjunctiva are pink and non-injected, sclera clear OROPHARYNX:no exudate, no erythema and lips, buccal mucosa, and tongue normal  NECK: supple, thyroid normal size, non-tender, without nodularity LYMPH:  no palpable lymphadenopathy in the cervical, axillary or inguinal LUNGS: clear to auscultation and percussion with normal breathing effort HEART: regular rate & rhythm and no murmurs and no lower extremity edema ABDOMEN:abdomen soft, non-tender and normal bowel sounds Musculoskeletal:no cyanosis of digits and no clubbing    NEURO: alert & oriented x 3 with fluent speech, no focal motor/sensory deficits  LABORATORY DATA:  I have reviewed the data as listed CBC Latest Ref Rng & Units 04/02/2018 03/21/2018 03/10/2018  WBC 4.0 - 10.5 K/uL 3.7(L) 4.1 4.7  Hemoglobin 12.0 - 15.0 g/dL 5.3(LL) 4.7(LL) 4.7(LL)  Hematocrit 36.0 - 46.0 % 18.9(L) 16.8(L) 16.8(L)  Platelets 150 - 400 K/uL 107(L) 113(L) 85(L)  CMP Latest Ref Rng & Units 04/02/2018 03/05/2018 03/03/2018  Glucose 70 - 99 mg/dL 150(H) 106(H) 118(H)  BUN 6 - 20 mg/dL _0 Creatinine 0.44 - 1.00 mg/dL 0.72 0.72 0.78  Sodium 135 - 145 mmol/L 141 137 137  Potassium 3.5 - 5.1 mmol/L 3.2(L) 4.0 3.9  Chloride 98 - 111 mmol/L 105 102 103  CO2 22 - 32 mmol/L _1 Calcium 8.9 - 10.3 mg/dL 10.5(H) 9.8 9.8  Total Protein 6.5 - 8.1 g/dL 7.4 - -  Total Bilirubin 0.3 - 1.2 mg/dL 1.3(H) - -  Alkaline Phos 38 - 126 U/L 90 - -  AST 15 - 41 U/L 26 - -  ALT 0 - 44 U/L 15 - -      RADIOGRAPHIC STUDIES: I have personally reviewed the radiological images as listed and agreed with the findings in the report. No results found.   ASSESSMENT & PLAN:  ETOILE LOOMAN is a 58 y.o. female with    1. 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 -Unfortunately she developed bone metastasis 1 year after she stopping  anastrozole.She was treated with AI and Ibrance, Ibrance stopped in 11/2017 due to severe anemia. She also stoppedexemestane in late November 2019, due to rib pain. -Monthly Xgeva injection has been held since September 2019when she developed severe anemia  -I previously discussed her FO genomic test results. 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  -We discussed her CT CAP from 04/02/18 which shows disease progression in bone and metastasis to liver.  -I strongly encourage her to consider breast cancer treatment with fulvestrant injection, if she tolerates well, will add oral Piqray one month later. I discussed the potential side effects of fulvestrant with her again in details, her friend also encouraged her to consider, she finally agreed to start on next visit.Marland Kitchen  -F/u in 2 weeks    2.Severe anemia  -She developed worsening anemia in September 2019, lowest hemoglobin 3.9, likely secondary to [place] and diffuse bone metastasis  -She is a Jehovah witness, refuses blood transfusion -She has been on Aranesp injection since then, anemia slowly improving. Will continue 516m injections every 2 weeks, will stop if Hb above 7-8 -will continue B12 injection monthly  3. Right LE DVT, Recent PE  -She was advised to restarted Lovenox 832m but stopped on her own due to the concerns of bleeding -She was recently hospitalized on 02/27/18 for multiple PE. This is likely related to metastatic cancer, immobility and Aranesp injections. Patient wishes to continue Aranesp injection despite the risk of thrombosis. -She again declined thrombectomy and Angovac which was offer by IR Dr. McGeroge Baseman-She still has significant lower extremity edema, stable overall    4. Cough and Dyspnea -f/u with pulmonologist Dr. WeMelvyn Novason Flovent, Cough improved with Hycodan, continue  -Stable   5. Body pain -She has been having bilateral rib cage pain, probably related to her bone metastasis -Take tramadol as needed  6. Goal of care discussion  -We again discussed the incurable nature of her cancer, and the overall poor prognosis, especially if she does not have good response to chemotherapy or progress on chemo -The patient understands the goal  of care is palliative. -I recommend DNR/DNI, we  had long conversation about her goal of care, her friends also encouraged her to consider DNR, she will think about it, will discuss with her children when they come next month    Plan -CT CAP reviewed with pt and it shows disease progression  -She will proceed with Aransep injection today and continue every 2 weeks  -Plan to start Fulvestrant injection in 2 week  -Lab and f/u in 2 weeks     No problem-specific Assessment & Plan notes found for this encounter.   No orders of the defined types were placed in this encounter.  All questions were answered. The patient knows to call the clinic with any problems, questions or concerns. No barriers to learning was detected. I spent 20 minutes counseling the patient face to face. The total time spent in the appointment was 25 minutes and more than 50% was on counseling and review of test results     [name], MD 04/04/2018   I, Joslyn Devon, am acting as scribe for [name], MD.   I have reviewed the above documentation for accuracy and completeness, and I agree with the above.

## 2018-04-07 ENCOUNTER — Telehealth: Payer: Self-pay | Admitting: Hematology

## 2018-04-07 NOTE — Telephone Encounter (Signed)
No los per 01/10.

## 2018-04-08 ENCOUNTER — Telehealth: Payer: Self-pay

## 2018-04-08 ENCOUNTER — Ambulatory Visit: Payer: Medicare Other | Attending: Internal Medicine | Admitting: Internal Medicine

## 2018-04-08 ENCOUNTER — Inpatient Hospital Stay (INDEPENDENT_AMBULATORY_CARE_PROVIDER_SITE_OTHER): Payer: Medicare Other | Admitting: Family Medicine

## 2018-04-08 ENCOUNTER — Ambulatory Visit
Admission: RE | Admit: 2018-04-08 | Discharge: 2018-04-08 | Disposition: A | Discharge status: Home / Self Care | Payer: Medicare Other | Source: Ambulatory Visit | Attending: Physician Assistant | Admitting: Physician Assistant

## 2018-04-08 ENCOUNTER — Encounter: Payer: Self-pay | Admitting: Radiology

## 2018-04-08 VITALS — BP 108/70 | HR 83 | Temp 98.6°F | Resp 16 | Ht 67.0 in | Wt 263.6 lb

## 2018-04-08 DIAGNOSIS — I82411 Acute embolism and thrombosis of right femoral vein: Secondary | ICD-10-CM

## 2018-04-08 DIAGNOSIS — R5383 Other fatigue: Secondary | ICD-10-CM | POA: Diagnosis not present

## 2018-04-08 DIAGNOSIS — C50919 Malignant neoplasm of unspecified site of unspecified female breast: Secondary | ICD-10-CM

## 2018-04-08 DIAGNOSIS — I82401 Acute embolism and thrombosis of unspecified deep veins of right lower extremity: Secondary | ICD-10-CM

## 2018-04-08 DIAGNOSIS — D649 Anemia, unspecified: Secondary | ICD-10-CM

## 2018-04-08 DIAGNOSIS — R5381 Other malaise: Secondary | ICD-10-CM

## 2018-04-08 DIAGNOSIS — C50912 Malignant neoplasm of unspecified site of left female breast: Secondary | ICD-10-CM

## 2018-04-08 DIAGNOSIS — R0602 Shortness of breath: Secondary | ICD-10-CM

## 2018-04-08 DIAGNOSIS — I2699 Other pulmonary embolism without acute cor pulmonale: Secondary | ICD-10-CM

## 2018-04-08 HISTORY — PX: IR RADIOLOGIST EVAL & MGMT: IMG5224

## 2018-04-08 NOTE — Progress Notes (Signed)
Patient ID: Michaela Little, female    DOB: [birth year]  MRN: [id number]  CC:  [email or web address]   Subjective:  Michaela Little is a 58 y.o. female who presents for UC visit.  Patient is followed at [facility] Her concerns today include:  Patient with history of hypertension, OSA, obesity, diastolic dysfunction, breast CA (invasive lobular CA dx 2010 and treated with neoadj chemo, mastectomy, XRT and 5 yrs of adjuvant antiestrogen therapy) with mets to the bone and iver  Pt presents as hosp f/u.  Pt hosp last mth with acute RT sided PE without signs of RT sided heart strain. Pt placed on Lovenox and IVC filter placed.  Pt apparently was dx with RLE DVT 1 mth prior at [facility].  She was started on Lovenox at that time but it was apparently stopped due to severe anemia and bleeding risk.  Pt decline blood transfusion because of religious reasons.  She is Jehov[employer]'s Witness.  Currently on Lovenox 110 mg BID SQ.  She is also on O2 2-4 lt since 12/2017.    In regards to her met breast CA, she is being followed and treated by Dr. Burr Medico.  She was on Ibrance which was d/c 11/2017 due to severe anemia.  Exemestane d/c 01/2018 due to rib pain. Receiving Aranesp inj Q 2 wks and Vit B 12 inj Q mth.   She is suppose to start a new treatment in 2 wks.  All treatment deemed palliative at this pt.  Pt followed by Palliative Care team.  She is not DNR but considering it.   She is requesting a home heath aid as ADLs are becoming more difficult due to fatigue and SOB with minimal activity.  Ambulates with rollator walker.  She lives alone.  PCS services denied in past as she was not deem to be stable. Pt not ready to consider home hospice   Patient Active Problem List   Diagnosis Date Noted  . Metastatic breast cancer ([place])   . Pulmonary embolism ([place]) 02/27/2018  . Acute respiratory failure with hypoxia ([place]) 01/04/2018  . Symptomatic anemia 01/02/2018  . Pancytopenia ([place]) 01/02/2018  . Bronchitis 01/02/2018  .  Patient is Jehovah's Witness   . Refusal of blood transfusions as patient is Jehovah's Witness   . Other pancytopenia ([place]) 12/24/2017  . Asthma exacerbation 10/18/2017  . High risk medication use 09/25/2017  . Lower extremity edema 09/25/2017  . S/P laparoscopic appendectomy 08/22/2017  . Goals of care, counseling/discussion 01/20/2017  . Multinodular thyroid 02/08/2016  . Lumbar back pain with radiculopathy affecting left lower extremity 02/08/2016  . Carcinoma of breast metastatic to bone ([place]) 02/08/2016  . Bone metastases ([place]) 11/13/2015  . Right thyroid nodule 06/30/2014  . Allergic rhinitis due to pollen 06/30/2014  . Essential hypertension 12/24/2013  . Left ventricular diastolic dysfunction with preserved systolic function 26/94/8546  . Lump in neck 08/11/2013  . Abdominal pain, chronic, bilateral lower quadrant 08/05/2013  . Abdominal pain, epigastric 08/05/2013  . Morbid (severe) obesity due to excess calories ([place]) 06/23/2013  . Allergic rhinitis 06/23/2013  . GERD (gastroesophageal reflux disease) 03/25/2013  . Constipation 03/25/2013  . Other and unspecified hyperlipidemia 03/25/2013  . Dysuria 03/25/2013  . Routine general medical examination at a health care facility 03/25/2013  . Malignant neoplasm of overlapping sites of left breast in female, estrogen receptor positive ([place]) 03/05/2013  . Cough 10/28/2012  . Dyspnea on exertion 10/28/2012  . Chronic abdominal pain 09/02/2012  . Pelvic pain in female 02/06/2012  .  Atypical chest pain 11/13/2011  . Hyperlipidemia 11/13/2011  . Dysplasia of cervix, low grade (CIN 1) 08/22/2011  . Diastolic dysfunction 43/15/4008  . Influenza 03/04/2011  . Hypertension 03/04/2011  . Obesity 03/04/2011  . OSA (obstructive sleep apnea) 02/27/2011  . Asthma 02/08/2011     Current Outpatient Medications on File Prior to Visit  Medication Sig Dispense Refill  . cetirizine (ZYRTEC) 10 MG tablet Take 10 mg by mouth daily.  11  .  enoxaparin (LOVENOX) 120 MG/0.8ML injection Inject 0.73 mLs (110 mg total) into the skin every 12 (twelve) hours. 43.8 mL 2  . exemestane (AROMASIN) 25 MG tablet Take 1 tablet (25 mg total) by mouth daily after breakfast. 90 tablet 1  . fluticasone furoate-vilanterol (BREO ELLIPTA) 100-25 MCG/INH AEPB Inhale 1 puff into the lungs daily. 28 each 5  . folic acid (FOLVITE) 1 MG tablet Take 1 tablet (1 mg total) by mouth daily. 30 tablet 0  . HYDROcodone-homatropine (HYCODAN) 5-1.5 MG/5ML syrup Take 5 mLs by mouth every 6 (six) hours as needed for cough. 120 mL 0  . ipratropium-albuterol (DUONEB) 0.5-2.5 (3) MG/3ML SOLN INHALE THREE MLS VIA NEBULIZER EVERY 6 HOURS AS NEEDED (Patient taking differently: Take 3 mLs by nebulization every 6 (six) hours as needed (for wheezing or shortness of breath). ) 360 mL 5  . Multiple Vitamin (MULTIVITAMIN) tablet Take 1 tablet by mouth daily.    . traMADol (ULTRAM) 50 MG tablet Take 2 tablets (100 mg total) by mouth every 6 (six) hours. 30 tablet 0  . vitamin B-12 1000 MCG tablet Take 1 tablet (1,000 mcg total) by mouth daily. 30 tablet 0   Current Facility-Administered Medications on File Prior to Visit  Medication Dose Route Frequency Provider Last Rate Last Dose  . 0.9 %  sodium chloride infusion   Intravenous Once [name], MD        Allergies  Allergen Reactions  . Lisinopril-Hydrochlorothiazide Itching  . Adhesive [Tape] Itching and Other (See Comments)    Redness  . Fentanyl Other (See Comments)    GI upset and drowsiness. Only to [facility]  . Gabapentin Other (See Comments)    Auditory hallucinations  . Hctz [Hydrochlorothiazide] Itching  . Latex Itching  . Lisinopril Itching  . Losartan Potassium Other (See Comments)    Makes her feel "bad"   . Other Other (See Comments)    Patient refuses blood for religious reasons (per her notes)  . Oxycodone     GI upset, drowsy  . Tums [Calcium Carbonate Antacid] Hives    FRUIT-FLAVORED ONES      ROS: Review of Systems As above  PHYSICAL EXAM: BP 108/70   Pulse 83   Temp 98.6 F (37 C) (Oral)   Resp 16   Ht 5' 7"  (1.702 m)   Wt 263 lb 9.6 oz (119.6 kg)   LMP 02/25/2009   SpO2 100%   BMI 41.29 kg/m   Physical Exam  General appearance - middle age AAF who appears chronically ill in NAD Mental status - normal mood, behavior, speech, dress, motor activity, and thought processes Mouth - mucous membranes moist, pharynx normal without lesions Neck - supple, no significant adenopathy Chest - clear to auscultation, no wheezes, rales or rhonchi, symmetric air entry Heart - normal rate, regular rhythm, normal S1, S2, no murmurs, rubs, clicks or gallops Extremities - + nonpitting edema RLE  ASSESSMENT AND PLAN: 1. Metastatic breast cancer [facility]) -Oncology note reviewed. Plan for pt to be started on  new treatment in 2 wks.  Pt understands that any treatment will not bring about a cure.  Will be seen by Palliative Care tomorrow  2. Declining functional status -Case Manager to see her today to arrange for eval for PCS services.  She will benefit from having an aid  3. Acute pulmonary embolism without acute cor pulmonale, unspecified pulmonary embolism type ([place]) 4. Leg DVT (deep venous thromboembolism), acute, right ([place]) -likely due to CA +/- Aranesp.  Pt on Lovenox  5. Severe anemia Followed by oncology.  On Aranesp.  Pt is Jeh Witness.   Patient was given the opportunity to ask questions.  Patient verbalized understanding of the plan and was able to repeat key elements of the plan.   No orders of the defined types were placed in this encounter.    Requested Prescriptions    No prescriptions requested or ordered in this encounter    Future Appointments  Date Time Provider [place]  04/18/2018  1:45 PM [facility] None  04/18/2018  2:15 PM [name], MD [facility] None  04/18/2018  2:45 PM [facility] [place] [facility] None   06/10/2018 10:30 AM Ladell Pier, MD [facility] None  01/14/2019 11:30 AM [name], MD [facility] None    Karle Plumber, MD, FACP

## 2018-04-08 NOTE — Progress Notes (Signed)
Chief Complaint: Patient was seen in follow-up today for  Chief Complaint  Patient presents with  . Consult    DVT/to discuss possible lysis     at the request of Ardis Rowan  Referring Physician(s): Ardis Rowan  History of Present Illness: Michaela Little is a 58 y.o. female [place] with a history of metastatic breast cancer.  I initially saw her in the hospital when she developed right lower extremity DVT and pulmonary embolus.  Her right lower extremity DVT was highly symptomatic, however she was also significantly anemic.  We discussed proceeding with an intervention at that time but she declined given the potential for blood loss and her inability to receive a transfusion.  She did want to follow-up as an outpatient and so presents today.  She has been on Lovenox and reports significant improvement in her right lower extremity swelling.  She is now able to stand and ambulate.  She occasionally has some mild pain in her knee and in her right thigh but this has been fairly well managed.  She wears compression socks which come up to the midcalf but is unable to tolerate thigh-high compression hose.  Past Medical History:  Diagnosis Date  . Anxiety   . Arthritis    hands, knees, hips  . Asthma   . Bronchitis   . Cancer ([place])   . Cervical stenosis (uterine cervix)   . Disorder of appendix    Enlarged  . Dyspnea on exertion   . GERD (gastroesophageal reflux disease)   . Headache(784.0)   . History of breast cancer    2010--  LEFT  s/p  mastectomy (in [place]) AND CHEMORADIATION--  NO RECURRENCE  . History of cervical dysplasia   . Hyperlipidemia   . Hypertension   . Malignant neoplasm of overlapping sites of left breast in female, estrogen receptor positive ([place]) 03/05/2013  . OSA (obstructive sleep apnea) moderate osa per study  09/2010   CPAP  , NOT USING ON REGULAR BASIS  . Pelvic pain in female   . Positive H. pylori test    08-05-2013  .  Positive TB test    AS TEEN--  [place] W/ MEDS  . Refusal of blood transfusions as patient is Jehovah's Witness   . Seasonal allergies   . Uterine fibroid   . Wears glasses     Past Surgical History:  Procedure Laterality Date  . APPENDECTOMY  y  . CARDIOVASCULAR STRESS TEST  10-29-2012   low risk perfusion study/  no significant reversibity/ ef 66%/  normal wall motion  . CERVICAL CONIZATION W/BX  2012   in  [place] N/A 05/14/2012   Procedure: LAPAROSCOPIC CHOLECYSTECTOMY;  Surgeon: Ralene Ok, MD;  Location: [facility];  Service: General;  Laterality: N/A;  . COLONOSCOPY  2011   normal per patient - [place]  . DILATION AND CURETTAGE OF UTERUS  10/15/2011   Procedure: DILATATION AND CURETTAGE;  Surgeon: Melina Schools, MD;  Location: [facility];  Service: Gynecology;  Laterality: N/A;  Conization &  endocervical curettings  . EXAMINATION UNDER ANESTHESIA N/A 08/20/2013   Procedure: EXAM UNDER ANESTHESIA;  Surgeon: Margarette Asal, MD;  Location: [facility];  Service: Gynecology;  Laterality: N/A;  . [place]   right  . IR IVC FILTER PLMT / S&I /IMG GUID/MOD SED  03/01/2018  . LAPAROSCOPIC APPENDECTOMY N/A 08/22/2017   Procedure: [place];  Surgeon: Coralie Keens, MD;  Location: [facility];  Service: General;  Laterality: N/A;  . LAPAROSCOPIC ASSISTED VAGINAL HYSTERECTOMY N/A 10/26/2014   Procedure: HYSTERECTOMY ABDOMINAL ;  Surgeon: Molli Posey, MD;  Location: [facility];  Service: Gynecology;  Laterality: N/A;  . MASTECTOMY Left 11/2008  in [place]   . SALPINGOOPHORECTOMY Bilateral 10/26/2014   Procedure: BILATERAL SALPINGO OOPHORECTOMY;  Surgeon: Molli Posey, MD;  Location: [facility];  Service: Gynecology;  Laterality: Bilateral;  . TISSUE EXPANDER PLACEMENT Left 08/22/2017   Procedure: REMOVAL OF LEFT TISSUE EXPANDER;  Surgeon: Crissie Reese, MD;  Location: [facility];  Service: Plastics;  Laterality: Left;  . TISSUE  EXPANDER REMOVAL Left 08/22/2017  . TRANSTHORACIC ECHOCARDIOGRAM  10-29-2012   mild lvh/  ef 60-65%/  grade II diastolic dysfunction/  trivial mr  &  tr    Allergies: Lisinopril-hydrochlorothiazide; Adhesive [tape]; Fentanyl; Gabapentin; Hctz [hydrochlorothiazide]; Latex; Lisinopril; Losartan potassium; Other; Oxycodone; and Tums [calcium carbonate antacid]  Medications: Prior to Admission medications   Medication Sig Start Date End Date Taking? Authorizing Provider  Multiple Vitamin (MULTIVITAMIN) tablet Take 1 tablet by mouth daily.   Yes [provider]  cetirizine (ZYRTEC) 10 MG tablet Take 10 mg by mouth daily. 12/19/17   [provider]  enoxaparin (LOVENOX) 120 MG/0.8ML injection Inject 0.73 mLs (110 mg total) into the skin every 12 (twelve) hours. 03/21/18 04/20/18  [name], MD  exemestane (AROMASIN) 25 MG tablet Take 1 tablet (25 mg total) by mouth daily after breakfast. 02/13/18   [name], MD  fluticasone furoate-vilanterol (BREO ELLIPTA) 100-25 MCG/INH AEPB Inhale 1 puff into the lungs daily. 10/18/17   Martyn Ehrich, NP  folic acid (FOLVITE) 1 MG tablet Take 1 tablet (1 mg total) by mouth daily. 01/05/18   Geradine Girt, DO  HYDROcodone-homatropine (HYCODAN) 5-1.5 MG/5ML syrup Take 5 mLs by mouth every 6 (six) hours as needed for cough. 03/21/18   [name], MD  ipratropium-albuterol (DUONEB) 0.5-2.5 (3) MG/3ML SOLN INHALE THREE MLS VIA NEBULIZER EVERY 6 HOURS AS NEEDED Patient taking differently: Take 3 mLs by nebulization every 6 (six) hours as needed (for wheezing or shortness of breath).  02/13/18   Clent Demark, PA-C  traMADol (ULTRAM) 50 MG tablet Take 2 tablets (100 mg total) by mouth every 6 (six) hours. 03/08/18   Cristal Ford, DO  vitamin B-12 1000 MCG tablet Take 1 tablet (1,000 mcg total) by mouth daily. 01/05/18   Geradine Girt, DO     Family History  Problem Relation Age of Onset  . Breast cancer Mother   . Colon cancer Mother    . Hypotension Mother   . Asthma Mother   . Diabetes type II Mother   . Arthritis Mother   . Clotting disorder Mother   . Cancer Mother        breast/colon  . Mental illness Brother   . Heart disease Brother   . Cerebral palsy Daughter   . Emphysema Brother        never smoker  . Colon cancer Maternal Aunt   . Cancer Maternal Aunt        colon  . Colon cancer Maternal Uncle   . Cancer Maternal Uncle        colon  . Esophageal cancer Neg Hx   . Rectal cancer Neg Hx   . Stomach cancer Neg Hx   . Thyroid disease Neg Hx     Social History   Socioeconomic History  .  Marital status: Single    Spouse name: Not on file  . Number of children: 5  . Years of education: Not on file  . Highest education level: Not on file  Occupational History  . Occupation: Disabled  Social Needs  . Financial resource strain: Not on file  . Food insecurity:    Worry: Not on file    Inability: Not on file  . Transportation needs:    Medical: Not on file    Non-medical: Not on file  Tobacco Use  . Smoking status: Former Smoker    Packs/day: 0.30    Years: 10.00    Pack years: 3.00    Types: Cigarettes    Last attempt to quit: 03/26/1988    Years since quitting: 30.0  . Smokeless tobacco: Never Used  Substance and Sexual Activity  . Alcohol use: No  . Drug use: No  . Sexual activity: Not Currently  Lifestyle  . Physical activity:    Days per week: Not on file    Minutes per session: Not on file  . Stress: Not on file  Relationships  . Social connections:    Talks on phone: Not on file    Gets together: Not on file    Attends religious service: Not on file    Active member of club or organization: Not on file    Attends meetings of clubs or organizations: Not on file    Relationship status: Not on file  Other Topics Concern  . Not on file  Social History Narrative  . Not on file     Review of Systems: A 12 point ROS discussed and pertinent positives are indicated in the HPI  above.  All other systems are negative.  Review of Systems  Vital Signs: BP 124/73   Pulse 86   Temp 98.2 F (36.8 C) (Oral)   Resp 16   Ht 5\' 7"  (1.702 m)   Wt 108.9 kg   LMP 02/25/2009   SpO2 100% Comment: O2 at 2L/nasal cannula  BMI 37.59 kg/m   Physical Exam Vitals signs and nursing note reviewed.  Constitutional:      General: She is not in acute distress.    Appearance: Normal appearance. She is obese.  HENT:     Head: Normocephalic and atraumatic.  Eyes:     General: No scleral icterus. Cardiovascular:     Rate and Rhythm: Normal rate.  Pulmonary:     Effort: Pulmonary effort is normal.  Musculoskeletal:        General: Swelling present.     Comments: Mild 1+ edema right anterior shin  Skin:    General: Skin is warm and dry.  Neurological:     Mental Status: She is alert and oriented to person, place, and time.  Psychiatric:        Mood and Affect: Mood normal.        Behavior: Behavior normal.      Imaging: Ct Angio Chest Pe W And/or Wo Contrast  Result Date: 04/02/2018 CLINICAL DATA:  58 year old female with history of bilateral flank pain and foul-smelling urine. History of metastatic breast cancer. Shortness of breath. EXAM: CT ANGIOGRAPHY CHEST CT ABDOMEN AND PELVIS WITH CONTRAST TECHNIQUE: Multidetector CT imaging of the chest was performed using the standard protocol during bolus administration of intravenous contrast. Multiplanar CT image reconstructions and MIPs were obtained to evaluate the vascular anatomy. Multidetector CT imaging of the abdomen and pelvis was performed using the standard protocol during  bolus administration of intravenous contrast. CONTRAST:  193mL ISOVUE-370 IOPAMIDOL (ISOVUE-370) INJECTION 76% COMPARISON:  PET-CT 01/15/2018. CT the chest, abdomen and pelvis 08/27/2017. FINDINGS: CTA CHEST FINDINGS Cardiovascular: No filling defects in the pulmonary arterial tree to suggest underlying pulmonary embolism. Heart size is mildly  enlarged. There is no significant pericardial fluid, thickening or pericardial calcification. Aortic atherosclerosis. No definite coronary artery calcifications. Mediastinum/Nodes: No pathologically enlarged mediastinal or hilar lymph nodes. Esophagus is unremarkable in appearance. No axillary lymphadenopathy. Numerous surgical clips in the left axillary region from prior lymph node dissection. Lungs/Pleura: No suspicious appearing pulmonary nodules or masses are noted. No acute consolidative airspace disease. No pleural effusions. Musculoskeletal: Status post left radical mastectomy and left axillary lymph node dissection. Innumerable mixed lytic and sclerotic lesions throughout the visualized axial and appendicular skeleton, compatible with widespread metastatic disease to the bones, progressive compared to the prior study from 08/27/2017. Review of the MIP images confirms the above findings. CT ABDOMEN and PELVIS FINDINGS Hepatobiliary: There are several small low-attenuation lesions scattered throughout the liver, at least 3 of which are new compared to prior studies and suspicious for metastatic lesions. The largest of these include a 12 x 14 mm lesion in segment 5 (axial image 31 of series 2), and a 15 x 12 mm lesion in the periphery of segment 8 (axial image 18 of series 2). No intra or extrahepatic biliary ductal dilatation. Status post cholecystectomy. Pancreas: No pancreatic mass. No pancreatic ductal dilatation. No pancreatic or peripancreatic fluid or inflammatory changes. Spleen: Unremarkable. Adrenals/Urinary Tract: 2.3 cm low-attenuation lesion in the lower pole of the right kidney, compatible with a simple cyst. Left kidney and bilateral adrenal glands are normal in appearance. No hydroureteronephrosis. Urinary bladder is normal in appearance. Stomach/Bowel: Normal appearance of the stomach. No pathologic dilatation of small bowel or colon. Normal appendix. Vascular/Lymphatic: Aortic atherosclerosis,  without evidence of aneurysm or dissection in the abdominal or pelvic vasculature. IVC filter in position with tip terminating immediately beneath the level of the renal veins. Large filling defect extending from the right common femoral vein into the proximal right external iliac vein (axial images 59-79 of series 2), compatible with a large deep venous thrombosis. Reproductive: Status post hysterectomy. Ovaries are not confidently identified may be surgically absent or atrophic. Other: No significant volume of ascites.  No pneumoperitoneum. Musculoskeletal: Innumerable mixed lytic and sclerotic lesions are noted throughout the visualized axial and appendicular skeleton, compatible with widespread metastatic disease to the bones, progressive compared to prior examinations. Review of the MIP images confirms the above findings. IMPRESSION: 1. Multiple new hepatic lesions highly concerning for developing metastatic disease to the liver. This could be confirmed with nonemergent MRI of the abdomen with and without IV gadolinium if clinically appropriate. 2. Widespread metastatic disease to the bones also appears progressive compared to prior examinations. 3. Large right-sided pelvic deep venous thrombosis extending from the right common femoral vein to the proximal right external iliac vein. IVC filter is in position. No clot identified in the IVC filter at this time. No pulmonary embolism on today's examination. 4. Aortic atherosclerosis. 5. Additional incidental findings, as above. Electronically Signed   By: Vinnie Langton M.D.   On: 04/02/2018 15:05   Ct Abdomen Pelvis W Contrast  Result Date: 04/02/2018 CLINICAL DATA:  58 year old female with history of bilateral flank pain and foul-smelling urine. History of metastatic breast cancer. Shortness of breath. EXAM: CT ANGIOGRAPHY CHEST CT ABDOMEN AND PELVIS WITH CONTRAST TECHNIQUE: Multidetector CT imaging of the chest  was performed using the standard protocol  during bolus administration of intravenous contrast. Multiplanar CT image reconstructions and MIPs were obtained to evaluate the vascular anatomy. Multidetector CT imaging of the abdomen and pelvis was performed using the standard protocol during bolus administration of intravenous contrast. CONTRAST:  148mL ISOVUE-370 IOPAMIDOL (ISOVUE-370) INJECTION 76% COMPARISON:  PET-CT 01/15/2018. CT the chest, abdomen and pelvis 08/27/2017. FINDINGS: CTA CHEST FINDINGS Cardiovascular: No filling defects in the pulmonary arterial tree to suggest underlying pulmonary embolism. Heart size is mildly enlarged. There is no significant pericardial fluid, thickening or pericardial calcification. Aortic atherosclerosis. No definite coronary artery calcifications. Mediastinum/Nodes: No pathologically enlarged mediastinal or hilar lymph nodes. Esophagus is unremarkable in appearance. No axillary lymphadenopathy. Numerous surgical clips in the left axillary region from prior lymph node dissection. Lungs/Pleura: No suspicious appearing pulmonary nodules or masses are noted. No acute consolidative airspace disease. No pleural effusions. Musculoskeletal: Status post left radical mastectomy and left axillary lymph node dissection. Innumerable mixed lytic and sclerotic lesions throughout the visualized axial and appendicular skeleton, compatible with widespread metastatic disease to the bones, progressive compared to the prior study from 08/27/2017. Review of the MIP images confirms the above findings. CT ABDOMEN and PELVIS FINDINGS Hepatobiliary: There are several small low-attenuation lesions scattered throughout the liver, at least 3 of which are new compared to prior studies and suspicious for metastatic lesions. The largest of these include a 12 x 14 mm lesion in segment 5 (axial image 31 of series 2), and a 15 x 12 mm lesion in the periphery of segment 8 (axial image 18 of series 2). No intra or extrahepatic biliary ductal dilatation.  Status post cholecystectomy. Pancreas: No pancreatic mass. No pancreatic ductal dilatation. No pancreatic or peripancreatic fluid or inflammatory changes. Spleen: Unremarkable. Adrenals/Urinary Tract: 2.3 cm low-attenuation lesion in the lower pole of the right kidney, compatible with a simple cyst. Left kidney and bilateral adrenal glands are normal in appearance. No hydroureteronephrosis. Urinary bladder is normal in appearance. Stomach/Bowel: Normal appearance of the stomach. No pathologic dilatation of small bowel or colon. Normal appendix. Vascular/Lymphatic: Aortic atherosclerosis, without evidence of aneurysm or dissection in the abdominal or pelvic vasculature. IVC filter in position with tip terminating immediately beneath the level of the renal veins. Large filling defect extending from the right common femoral vein into the proximal right external iliac vein (axial images 59-79 of series 2), compatible with a large deep venous thrombosis. Reproductive: Status post hysterectomy. Ovaries are not confidently identified may be surgically absent or atrophic. Other: No significant volume of ascites.  No pneumoperitoneum. Musculoskeletal: Innumerable mixed lytic and sclerotic lesions are noted throughout the visualized axial and appendicular skeleton, compatible with widespread metastatic disease to the bones, progressive compared to prior examinations. Review of the MIP images confirms the above findings. IMPRESSION: 1. Multiple new hepatic lesions highly concerning for developing metastatic disease to the liver. This could be confirmed with nonemergent MRI of the abdomen with and without IV gadolinium if clinically appropriate. 2. Widespread metastatic disease to the bones also appears progressive compared to prior examinations. 3. Large right-sided pelvic deep venous thrombosis extending from the right common femoral vein to the proximal right external iliac vein. IVC filter is in position. No clot identified  in the IVC filter at this time. No pulmonary embolism on today's examination. 4. Aortic atherosclerosis. 5. Additional incidental findings, as above. Electronically Signed   By: Vinnie Langton M.D.   On: 04/02/2018 15:05    Labs:  CBC: Recent Labs  03/06/18 0331 03/10/18 1019 03/21/18 1422 04/02/18 1235  WBC 5.5 4.7 4.1 3.7*  HGB 4.4* 4.7* 4.7* 5.3*  HCT 15.8* 16.8* 16.8* 18.9*  PLT 78* 85* 113* 107*    COAGS: Recent Labs    01/01/18 1637 02/28/18 1430  INR 1.25 1.31  APTT 27  --     BMP: Recent Labs    03/01/18 0610 03/03/18 0602 03/05/18 0415 04/02/18 1235  NA 138  137 137 137 141  K 4.0  4.0 3.9 4.0 3.2*  CL 103  103 103 102 105  CO2 23  23 22 25 26   GLUCOSE 131*  130* 118* 106* 150*  BUN 10  10 11 11 10   CALCIUM 10.0  10.0 9.8 9.8 10.5*  CREATININE 0.81  0.79 0.78 0.72 0.72  GFRNONAA >60  >60 >60 >60 >60  GFRAA >60  >60 >60 >60 >60    LIVER FUNCTION TESTS: Recent Labs    02/11/18 1109 02/24/18 0948 03/01/18 0610 03/03/18 0602 04/02/18 1235  BILITOT 1.9* 1.7* 2.0*  --  1.3*  AST 21 23 22   --  26  ALT 12 17 15   --  15  ALKPHOS 133* 134* 114  --  90  PROT 7.2 7.1 7.0  --  7.4  ALBUMIN 3.2* 3.4* 3.5  3.5 3.1* 3.7    TUMOR MARKERS: No results for input(s): AFPTM, CEA, CA199, CHROMGRNA in the last 8760 hours.  Assessment and Plan:  Doing significantly better while on anticoagulation.  Her right lower extremity edema is much improved and she is able to ambulate with some assistance.  Overall, this is a very good outcome for her.  She is now in the subacute phase of DVT during which any interventional procedure is significantly more difficult.  If she has recurrent or persistent symptoms once the thrombus has become chronic (1 year out) we could consider an intervention at that time.  But, given her improvement already I suspect she will be doing well at 1 year.  We will see her again then to reassess.  1.)  Follow-up clinic visit with  right lower extremity duplex venous ultrasound in 1 year.    Electronically Signed: Jacqulynn Cadet 04/08/2018, 11:01 AM   I spent a total of  15 Minutes in face to face in clinical consultation, greater than 50% of which was counseling/coordinating care for right lower extremity DVT.

## 2018-04-08 NOTE — Progress Notes (Signed)
Pt states she broke out in sweats today in the nail salon. Pt states she felt like she was going to fall out.

## 2018-04-08 NOTE — Telephone Encounter (Signed)
Met with the patient when she was in the clinic today.  She is requesting a referral for PCS. Dr Wynetta Emery is in agreement.

## 2018-04-08 NOTE — Telephone Encounter (Signed)
Visit scheduled with Joellen Jersey NP/ Palliative care for 04/09/2018

## 2018-04-09 ENCOUNTER — Telehealth: Payer: Self-pay

## 2018-04-09 ENCOUNTER — Other Ambulatory Visit: Payer: Medicare Other | Admitting: Primary Care

## 2018-04-09 DIAGNOSIS — C50919 Malignant neoplasm of unspecified site of unspecified female breast: Secondary | ICD-10-CM

## 2018-04-09 DIAGNOSIS — I82411 Acute embolism and thrombosis of right femoral vein: Secondary | ICD-10-CM

## 2018-04-09 DIAGNOSIS — D649 Anemia, unspecified: Secondary | ICD-10-CM

## 2018-04-09 DIAGNOSIS — C7951 Secondary malignant neoplasm of bone: Secondary | ICD-10-CM

## 2018-04-09 DIAGNOSIS — Z515 Encounter for palliative care: Secondary | ICD-10-CM

## 2018-04-09 NOTE — Progress Notes (Signed)
[facility] Telephone: [phone] Fax: [phone]  PATIENT NAME: Michaela Little DOB: [birth year] MRN: [id number]  PRIMARY CARE PROVIDER:   Ladell Pier, MD [name]  REFERRING PROVIDER:   Dr Burr Medico  RESPONSIBLE PARTY:   Extended Emergency Contact Information Primary Emergency Contact: [place] Address: [address]          [place], [place] [address] [name] of [place] Phone: [phone] Relation: Daughter Secondary Emergency Contact: Renaldo Reel,  [address] [place] Mobile Phone: [phone] Relation: Relative  Palliative care is seeing pt in community on referral from Dr Burr Medico, for care planning and symptom management.  ASSESSMENT and RECOMMENDATIONS:     1. Increased metastatic spread: Recent scans have shown new liver metastases and continuation of bone metastases. She states she was given the option of a drug which she said was new and the more she thought about it, the less inclined she is  to consider continuing with her chemo. Allowed her to verbalize concerns and supportive listening. She stated that she has faith but does not want to feel terrible or make her situation worse with another chemotherapy regimen. She plans to speak with her children, adults, about this. One daughter was present in the home briefly during this discussion but left without participating. Patient voices that the children are conflicted about her health care choices. Meeting is planned in early Feb.   2. Dyspnea. Much improved setting on oxygen at 99% on room air. State she doesn't really need it as much but does use it when she's going out.   3. Function/ADL ability : States she is being considered for home aide hours.  Pt spoke with Delana Meyer SW at [facility]. Very much needs adl assistance to remain in her home in the face of decline.  Can ambulate in the home and make herself some meals. Today she's  eating a meals on wheels that was given to her. She very much enjoyed having the pre-made food. Her daughters run errands for her. She denies falls.   4. Goals of care:  Again reiterated the scope of services that Hospice could offer if she decided that comfort was her goal. It sounded from much of the discussion today that that she was considering comfort to be her ultimate goal. I encouraged her to ask any questions of her oncologist. I also offered her literature for hospice, the services that would be offered and information about kids path, as she has school-age grandchildren. She kept these brochures and looked through them and we will talk again on our next visit. I reiterated need to establish legal power of attorney, has 5 children.   I spent 60 minutes providing this consultation,  from 1130 to 1230. More than 50% of the time in this consultation was spent coordinating communication.   HISTORY OF PRESENT ILLNESS:  Michaela Little is a 58 y.o. year old female with multiple medical problems including breast cancer with new mets to bone and liver,h/o DVT,obesity, HTN, HDL, OSA. Palliative Care was asked to help address goals of care.   CODE STATUS: FULL  PPS: 40% HOSPICE ELIGIBILITY/DIAGNOSIS: TBD  PAST MEDICAL HISTORY:  Past Medical History:  Diagnosis Date  . Anxiety   . Arthritis    hands, knees, hips  . Asthma   . Bronchitis   . Cancer ([place])   . Cervical stenosis (uterine cervix)   . Disorder of  appendix    Enlarged  . Dyspnea on exertion   . GERD (gastroesophageal reflux disease)   . Headache(784.0)   . History of breast cancer    2010--  LEFT  s/p  mastectomy (in [place]) AND CHEMORADIATION--  NO RECURRENCE  . History of cervical dysplasia   . Hyperlipidemia   . Hypertension   . Malignant neoplasm of overlapping sites of left breast in female, estrogen receptor positive ([place]) 03/05/2013  . OSA (obstructive sleep apnea) moderate osa per study  09/2010   CPAP  , NOT USING ON  REGULAR BASIS  . Pelvic pain in female   . Positive H. pylori test    08-05-2013  . Positive TB test    AS TEEN--  [place] W/ MEDS  . Refusal of blood transfusions as patient is Jehovah's Witness   . Seasonal allergies   . Uterine fibroid   . Wears glasses     SOCIAL HX:  Social History   Tobacco Use  . Smoking status: Former Smoker    Packs/day: 0.30    Years: 10.00    Pack years: 3.00    Types: Cigarettes    Last attempt to quit: 03/26/1988    Years since quitting: 30.0  . Smokeless tobacco: Never Used  Substance Use Topics  . Alcohol use: No    ALLERGIES:  Allergies  Allergen Reactions  . Lisinopril-Hydrochlorothiazide Itching  . Adhesive [Tape] Itching and Other (See Comments)    Redness  . Fentanyl Other (See Comments)    GI upset and drowsiness. Only to [facility]  . Gabapentin Other (See Comments)    Auditory hallucinations  . Hctz [Hydrochlorothiazide] Itching  . Latex Itching  . Lisinopril Itching  . Losartan Potassium Other (See Comments)    Makes her feel "bad"   . Other Other (See Comments)    Patient refuses blood for religious reasons (per her notes)  . Oxycodone     GI upset, drowsy  . Tums [Calcium Carbonate Antacid] Hives    FRUIT-FLAVORED ONES     PERTINENT MEDICATIONS:  Outpatient Encounter Medications as of 04/09/2018  Medication Sig  . cetirizine (ZYRTEC) 10 MG tablet Take 10 mg by mouth daily.  Marland Kitchen enoxaparin (LOVENOX) 120 MG/0.8ML injection Inject 0.73 mLs (110 mg total) into the skin every 12 (twelve) hours.  Marland Kitchen exemestane (AROMASIN) 25 MG tablet Take 1 tablet (25 mg total) by mouth daily after breakfast.  . fluticasone furoate-vilanterol (BREO ELLIPTA) 100-25 MCG/INH AEPB Inhale 1 puff into the lungs daily.  . folic acid (FOLVITE) 1 MG tablet Take 1 tablet (1 mg total) by mouth daily.  Marland Kitchen HYDROcodone-homatropine (HYCODAN) 5-1.5 MG/5ML syrup Take 5 mLs by mouth every 6 (six) hours as needed for cough.  Marland Kitchen ipratropium-albuterol (DUONEB) 0.5-2.5  (3) MG/3ML SOLN INHALE THREE MLS VIA NEBULIZER EVERY 6 HOURS AS NEEDED (Patient taking differently: Take 3 mLs by nebulization every 6 (six) hours as needed (for wheezing or shortness of breath). )  . Multiple Vitamin (MULTIVITAMIN) tablet Take 1 tablet by mouth daily.  . traMADol (ULTRAM) 50 MG tablet Take 2 tablets (100 mg total) by mouth every 6 (six) hours.  . vitamin B-12 1000 MCG tablet Take 1 tablet (1,000 mcg total) by mouth daily.   Facility-Administered Encounter Medications as of 04/09/2018  Medication  . 0.9 %  sodium chloride infusion    PHYSICAL EXAM:   VS 98.8- 88-18  154/89  99% Oxygen RA  General: NAD, frail appearing, obese, Wt 236 lb Cardiovascular:  regular rate and rhythm, S1S2 Pulmonary: clear all fields, dry cough, no dyspnea reported Abdomen: soft, nontender, + bowel sounds, appetite good Extremities: slight R soreness and  edema, d/t known large DVT, no joint deformities Skin: no rashes, wounds Neurological: Weakness , alert and oriented x 4, good historian.   Cyndia Skeeters DNP, AGPCNP-BC

## 2018-04-09 NOTE — Telephone Encounter (Signed)
Referral for PCS faxed to [facility] 

## 2018-04-10 ENCOUNTER — Inpatient Hospital Stay (INDEPENDENT_AMBULATORY_CARE_PROVIDER_SITE_OTHER): Payer: Medicare Other | Admitting: Family Medicine

## 2018-04-16 ENCOUNTER — Telehealth: Payer: Self-pay

## 2018-04-16 NOTE — Telephone Encounter (Signed)
Patient calls for script for Ensure to be faxed to [facility], this was done sent to [facility] for scanning.

## 2018-04-16 NOTE — Progress Notes (Signed)
[place]   Telephone:([phone]) [phone] [phone]) [phone]   Clinic Follow up Note   Patient Care Team: Ladell Pier, MD as PCP - General (Internal Medicine) [name], MD as Consulting Physician (Pulmonary Disease) [name], MD as Consulting Physician (Hematology) [name], NP as Nurse Practitioner (Hospice and Palliative Medicine) Clent Demark, PA-C as Physician Assistant (Physician Assistant)  Date of Service:  04/18/2018  CHIEF COMPLAINT: F/u of metastatic breast cancer and severe anemia  SUMMARY OF ONCOLOGIC HISTORY: Oncology History   Cancer Staging Malignant neoplasm of overlapping sites of left breast in female, estrogen receptor positive ([place]) Staging form: Breast, AJCC 7th Edition - Clinical: Stage IIIA (T2, N2, M0) - Unsigned       Malignant neoplasm of overlapping sites of left breast in female, estrogen receptor positive ([place])   05/2008 Cancer Diagnosis    Left sided breast cancer (no path report available)    11/2008 Pathologic Stage    Stage IIIA: T2 N2     Neo-Adjuvant Chemotherapy    Paclitaxel weekly x 12; doxorubicin and cyclophosphamide x 4 - both given with trifiparnib (treated at [facility] in [place])    11/2008 Definitive Surgery    Left modified radiation mastectomy: invasive lobular carcinoma, ER+, PR+, HER2/neu negative, 5/22 LN positive     - 03/2009 Radiation Therapy    Adjuvant radiation to left chest wall    2011 - 08/2014 Anti-estrogen oral therapy    Tamoxifen 20 mg used briefly; discontinued due to uterine lining concerns; changed to anastrozole 1 mg daily (began 07/2100); discontinued by patient summer of 2016    05/26/2015 Progression    Iliac bone biopsy showed metastatic carcinoma consistent with a breast primary, ER positive, PR and HER-2 negative. Her staging CT, and a PET scan was negative for visceral metastasis.    05/2015 - 01/2018 Anti-estrogen oral therapy    1. letrozole and Ibrance started  March 2017             -Ibrance dose decreased to 75 mg daily. Stopped in 11/2017 due to anemia   -Letrozole switched to Exemestane on 02/13/17 due to b/l rib pain. She exemestane herself in 01/2018.      11/16/2015 Miscellaneous    Xgeva monthly for bone metastasis     07/03/2016 Imaging    CT chest, abdomen and pelvis with contrast showed no evidence of metastasis.     02/07/2017 Imaging    CT AP W Contrast 02/07/17 IMPRESSION: 1. No CT findings for abdominal/pelvic metastatic disease. 2. No acute abdominal findings, mass lesions or adenopathy. 3. Status post cholecystectomy with mild associated common bile duct dilatation. 4. Somewhat thickened appendix appears relatively stable. No acute inflammatory process. 5. Stable mixed lytic and sclerotic osseous metastatic disease.     02/07/2017 Imaging    Bone Scan Whole Body 02/07/17 IMPRESSION: No scintigraphic evidence skeletal metastasis     08/12/2017 Imaging    Whole Boday Scan 08/12/17 IMPRESSION: Scattered degenerative type uptake as above with a questionable focus of increased tracer localization at L2 vertebral body versus artifact; no abnormality is seen at this site by CT. Consider either characterization by MR or attention on follow-up imaging.    08/12/2017 Imaging    CT AP W Contrast 08/12/17  IMPRESSION: Continued thickening of the appendix is noted without surrounding inflammation. It has maximum measured diameter is decreased compared to prior exam. There is no evidence of acute inflammation. Stable mixed lytic and sclerotic appearance of  visualized skeleton is noted consistent with history of known osseous metastases. No acute abnormality seen in the abdomen or pelvis.    08/22/2017 Surgery    APPENDECTOMY LAPAROSCOPIC ERAS PATHWAY by Dr. Malcolm Metro and REMOVAL OF LEFT TISSUE EXPANDER by Dr. Harlow Mares 08/22/17    08/22/2017 Pathology Results    Diagnosis 08/22/17  1. Appendix, Other than Incidental - METASTATIC  CARCINOMA, CONSISTENT WITH BREAST PRIMARY. - CARCINOMA IS PRESENT AT THE SURGICAL RESECTION MARGIN AS WELL AS THE SEROSAL SURFACE. - LYMPHOVASCULAR INVASION IS IDENTIFIED. - SEE COMMENT. 2. Breast, capsule, Left - DENSE FIBROUS TISSUE WITH MILD INFLAMMATION, INCLUDING A FEW SCATTERED MULTINUCLEATED GIANT CELLS. - THERE IS NO EVIDENCE OF MALIGNANCY.    08/27/2017 Imaging    CT AP W Contrast 08/27/17 IMPRESSION: No evidence of pulmonary embolus. Small pericardial effusion with uncertain clinical significance. No evidence of acute abnormalities within the chest, abdomen or pelvis. Area of fat stranding and small focus of gas within the anterior abdominal wall, immediately inferior to the umbilicus, probably at the site of laparoscopic access.    10/07/2017 Echocardiogram    10/07/2017 ECO Study Conclusions  - Procedure narrative: Transthoracic echocardiography. Image   quality was adequate. The study was technically difficult, as a   result of poor acoustic windows. - Left ventricle: The cavity size was normal. Wall thickness was   normal. Systolic function was normal. The estimated ejection   fraction was in the range of 60% to 65%. Wall motion was normal;   there were no regional wall motion abnormalities. Features are   consistent with a pseudonormal left ventricular filling pattern,   with concomitant abnormal relaxation and increased filling   pressure (grade 2 diastolic dysfunction).    01/16/2018 PET scan    01/16/2018 PET Scan IMPRESSION: 1. Most striking finding is intense marrow activity diffusely throughout the axillary and appendicular skeleton. This is favored a physiologic marrow response from chronic anemia rather than diffuse metastatic disease. 2. Several discrete foci of more intense activity noted in the spine which could indicate malignancy or trauma. Difficult to define malignancy on the background of diffuse marrow activity. 3. Diffuse sclerosis throughout  the bones similar to a recent CT scans but new from remote PET-CT scan 03/01/2016. 4. No evidence soft tissue metastasis.     04/02/2018 Imaging    CT Angio CAP 04/02/18  IMPRESSION: 1. Multiple new hepatic lesions highly concerning for developing metastatic disease to the liver. This could be confirmed with nonemergent MRI of the abdomen with and without IV gadolinium if clinically appropriate. 2. Widespread metastatic disease to the bones also appears progressive compared to prior examinations. 3. Large right-sided pelvic deep venous thrombosis extending from the right common femoral vein to the proximal right external iliac vein. IVC filter is in position. No clot identified in the IVC filter at this time. No pulmonary embolism on today's examination. 4. Aortic atherosclerosis. 5. Additional incidental findings, as above.     Anti-estrogen oral therapy    Pending Fulvestrant injections monthly       CURRENT THERAPY:  -Aranesp injection every 2 weeks -B12 injectionsmonthly  -Lovenox '120mg'$  q12h -plan to start fulvestrant injection soon   INTERVAL HISTORY:  [employer] is here for a follow up of anemia and metastatic breast cancer. She presents to the clinic today accompanied by her friends. She notes she is very weak and has headaches. She notes having dysuria on and off for some time. She feels she has another UTI although prior workup  does not support that.  She notes there was difficulty removing her left breast expender. Now she has pain radiating from her left chest wall down her chest.  She feels she may be too week for Fulvestrant injection today. She is emotional about her condition as she feels it is harder for her tolerate her pain, fatigue and symptoms. She notes her daughters are coming to see her next month. She has homecare nurse to see her on 1/29 to do an assessment. She notes her ankle swelling has decreased greatly but still has stable swelling in upper leg.        REVIEW OF SYSTEMS:   Constitutional: Denies fevers, chills or abnormal weight loss (+) significant fatigue and weakness (+) headaches  Eyes: Denies blurriness of vision Ears, nose, mouth, throat, and face: Denies mucositis or sore throat Respiratory: Denies cough, dyspnea or wheezes Cardiovascular: Denies palpitation, chest discomfort (+) improved lower extremity swelling of ankles  Gastrointestinal:  Denies nausea, heartburn or change in bowel habits Skin: Denies abnormal skin rashes UA: (+) dysuria  Lymphatics: Denies new lymphadenopathy or easy bruising Neurological:Denies numbness, tingling or new weaknesses Behavioral/Psych: Mood is stable, no new changes (+) emotional about her current condition  BREAST; (+) Left chest wall pain radiating down chest  All other systems were reviewed with the patient and are negative.  MEDICAL HISTORY:  Past Medical History:  Diagnosis Date  . Anxiety   . Arthritis    hands, knees, hips  . Asthma   . Bronchitis   . Cancer ([place])   . Cervical stenosis (uterine cervix)   . Disorder of appendix    Enlarged  . Dyspnea on exertion   . GERD (gastroesophageal reflux disease)   . Headache(784.0)   . History of breast cancer    2010--  LEFT  s/p  mastectomy (in [place]) AND CHEMORADIATION--  NO RECURRENCE  . History of cervical dysplasia   . Hyperlipidemia   . Hypertension   . Malignant neoplasm of overlapping sites of left breast in female, estrogen receptor positive ([place]) 03/05/2013  . OSA (obstructive sleep apnea) moderate osa per study  09/2010   CPAP  , NOT USING ON REGULAR BASIS  . Pelvic pain in female   . Positive H. pylori test    08-05-2013  . Positive TB test    AS TEEN--  [place] W/ MEDS  . Refusal of blood transfusions as patient is Jehovah's Witness   . Seasonal allergies   . Uterine fibroid   . Wears glasses     SURGICAL HISTORY: Past Surgical History:  Procedure Laterality Date  . APPENDECTOMY  y  . CARDIOVASCULAR STRESS  TEST  10-29-2012   low risk perfusion study/  no significant reversibity/ ef 66%/  normal wall motion  . CERVICAL CONIZATION W/BX  2012   in  [place] N/A 05/14/2012   Procedure: LAPAROSCOPIC CHOLECYSTECTOMY;  Surgeon: Ralene Ok, MD;  Location: [facility];  Service: General;  Laterality: N/A;  . COLONOSCOPY  2011   normal per patient - [place]  . DILATION AND CURETTAGE OF UTERUS  10/15/2011   Procedure: DILATATION AND CURETTAGE;  Surgeon: Melina Schools, MD;  Location: [facility];  Service: Gynecology;  Laterality: N/A;  Conization &  endocervical curettings  . EXAMINATION UNDER ANESTHESIA N/A 08/20/2013   Procedure: EXAM UNDER ANESTHESIA;  Surgeon: Margarette Asal, MD;  Location: [facility];  Service: Gynecology;  Laterality: N/A;  . [place]  right  . IR IVC FILTER PLMT / S&I /IMG GUID/MOD SED  03/01/2018  . IR RADIOLOGIST EVAL & MGMT  04/08/2018  . LAPAROSCOPIC APPENDECTOMY N/A 08/22/2017   Procedure: [place];  Surgeon: Coralie Keens, MD;  Location: [facility];  Service: General;  Laterality: N/A;  . LAPAROSCOPIC ASSISTED VAGINAL HYSTERECTOMY N/A 10/26/2014   Procedure: HYSTERECTOMY ABDOMINAL ;  Surgeon: Molli Posey, MD;  Location: [facility];  Service: Gynecology;  Laterality: N/A;  . MASTECTOMY Left 11/2008  in [place]   . SALPINGOOPHORECTOMY Bilateral 10/26/2014   Procedure: BILATERAL SALPINGO OOPHORECTOMY;  Surgeon: Molli Posey, MD;  Location: [facility];  Service: Gynecology;  Laterality: Bilateral;  . TISSUE EXPANDER PLACEMENT Left 08/22/2017   Procedure: REMOVAL OF LEFT TISSUE EXPANDER;  Surgeon: Crissie Reese, MD;  Location: [facility];  Service: Plastics;  Laterality: Left;  . TISSUE EXPANDER REMOVAL Left 08/22/2017  . TRANSTHORACIC ECHOCARDIOGRAM  10-29-2012   mild lvh/  ef 60-65%/  grade II diastolic dysfunction/  trivial mr  &  tr    I have reviewed the social history and family history with the patient and  they are unchanged from previous note.  ALLERGIES:  is allergic to lisinopril-hydrochlorothiazide; adhesive [tape]; fentanyl; gabapentin; hctz [hydrochlorothiazide]; latex; lisinopril; losartan potassium; other; oxycodone; and tums [calcium carbonate antacid].  MEDICATIONS:  Current Outpatient Medications  Medication Sig Dispense Refill  . cetirizine (ZYRTEC) 10 MG tablet Take 10 mg by mouth daily.  11  . enoxaparin (LOVENOX) 120 MG/0.8ML injection Inject 0.73 mLs (110 mg total) into the skin every 12 (twelve) hours. 43.8 mL 2  . exemestane (AROMASIN) 25 MG tablet Take 1 tablet (25 mg total) by mouth daily after breakfast. 90 tablet 1  . fluticasone furoate-vilanterol (BREO ELLIPTA) 100-25 MCG/INH AEPB Inhale 1 puff into the lungs daily. 28 each 5  . folic acid (FOLVITE) 1 MG tablet Take 1 tablet (1 mg total) by mouth daily. 30 tablet 0  . HYDROcodone-homatropine (HYCODAN) 5-1.5 MG/5ML syrup Take 5 mLs by mouth every 6 (six) hours as needed for cough. 120 mL 0  . ipratropium-albuterol (DUONEB) 0.5-2.5 (3) MG/3ML SOLN INHALE THREE MLS VIA NEBULIZER EVERY 6 HOURS AS NEEDED (Patient taking differently: Take 3 mLs by nebulization every 6 (six) hours as needed (for wheezing or shortness of breath). ) 360 mL 5  . Multiple Vitamin (MULTIVITAMIN) tablet Take 1 tablet by mouth daily.    . traMADol (ULTRAM) 50 MG tablet Take 2 tablets (100 mg total) by mouth every 6 (six) hours. 30 tablet 0  . vitamin B-12 1000 MCG tablet Take 1 tablet (1,000 mcg total) by mouth daily. 30 tablet 0   Current Facility-Administered Medications  Medication Dose Route Frequency Provider Last Rate Last Dose  . 0.9 % NaCl with KCl 20 mEq/ L  infusion   Intravenous Once [name], MD 500 mL/hr at 04/18/18 1545     Facility-Administered Medications Ordered in Other Visits  Medication Dose Route Frequency Provider Last Rate Last Dose  . 0.9 %  sodium chloride infusion   Intravenous Once [name], MD      . cyanocobalamin  ((VITAMIN B-12)) injection 1,000 mcg  1,000 mcg Intramuscular Once [name], MD      . fulvestrant (FASLODEX) injection 500 mg  500 mg Intramuscular Once [name], MD        PHYSICAL EXAMINATION: ECOG PERFORMANCE STATUS: 3 - Symptomatic, >50% confined to bed  Vitals:   04/18/18 1446  BP: Marland Kitchen)  149/73  Pulse: 82  Resp: 16  Temp: 98.3 F (36.8 C)  SpO2: 100%   Filed Weights   04/18/18 1446  Weight: 228 lb (103.4 kg)    GENERAL: alert, no distress, chronically ill-appearing SKIN: (+) palel, no rashes or significant lesions EYES: normal, Conjunctiva are pink and non-injected, sclera clear OROPHARYNX:no exudate, no erythema and lips, buccal mucosa, and tongue normal  NECK: supple, thyroid normal size, non-tender, without nodularity LYMPH:  no palpable lymphadenopathy in the cervical, axillary or inguinal LUNGS: clear to auscultation and percussion with normal breathing effort HEART: regular rate & rhythm and no murmurs (+) improved lower extremity edema at ankles  ABDOMEN:abdomen soft, non-tender and normal bowel sounds Musculoskeletal:no cyanosis of digits and no clubbing  NEURO: alert & oriented x 3 with fluent speech, no focal motor/sensory deficits  LABORATORY DATA:  I have reviewed the data as listed CBC Latest Ref Rng & Units 04/18/2018 04/02/2018 03/21/2018  WBC 4.0 - 10.5 K/uL 3.8(L) 3.7(L) 4.1  Hemoglobin 12.0 - 15.0 g/dL 5.8(LL) 5.3(LL) 4.7(LL)  Hematocrit 36.0 - 46.0 % 19.9(L) 18.9(L) 16.8(L)  Platelets 150 - 400 K/uL 124(L) 107(L) 113(L)     CMP Latest Ref Rng & Units 04/18/2018 04/02/2018 03/05/2018  Glucose 70 - 99 mg/dL 122(H) 150(H) 106(H)  BUN 6 - 20 mg/dL _0 Creatinine 0.44 - 1.00 mg/dL 1.40(H) 0.72 0.72  Sodium 135 - 145 mmol/L 142 141 137  Potassium 3.5 - 5.1 mmol/L 2.6(LL) 3.2(L) 4.0  Chloride 98 - 111 mmol/L 100 105 102  CO2 22 - 32 mmol/L _1 Calcium 8.9 - 10.3 mg/dL 15.5(HH) 10.5(H) 9.8  Total Protein 6.5 - 8.1 g/dL 7.6 7.4 -  Total  Bilirubin 0.3 - 1.2 mg/dL 1.5(H) 1.3(H) -  Alkaline Phos 38 - 126 U/L 116 90 -  AST 15 - 41 U/L 24 26 -  ALT 0 - 44 U/L 9 15 -      RADIOGRAPHIC STUDIES: I have personally reviewed the radiological images as listed and agreed with the findings in the report. No results found.   ASSESSMENT & PLAN:  Michaela Little is a 58 y.o. female with    1. Hypercalcemia, [place] secondary to her malignancy -Ca at 15.5, symptomatics with severe fatigue and headaches.  -likely secondary to bone metastasis -Will admit to [facility] for IV Fluids, IV Biphosphonate -will restart monthly Xgeva in a few weeks when her renal function recovers    2. AKI, Hypokalemia -AKI secondary to #1  -K at 2.6 and Cr at 1.40 today (04/18/18) -Complains of dysuria today  -Will give IVFs and IV potassium today   3. 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 -Unfortunately she developed bone metastasis 1 year after she stopping anastrozole.She was treated with AI and Ibrance. [name] was stopped in 11/2017 due to severe anemia. She also stoppedexemestane in late November 2019 herself. -Monthly Xgeva injection has been held since September 2019when she developed severe anemia  -We previously discussed her CT CAP from 04/02/18 which shows disease progression in bone and metastasis to liver.  -Given her FO genomic test results show PIK3CA mutation, I recommended Alpelisib (Piqray), along with fulvestrant injection.  -She is again hesitant about restarting breast cancer treatment with concerns about tolerating treatment given her lower performance status. -I explained her symptoms are mostly related to her cancer and treating her  cancer will improve her symptoms. I explained possible side effects from Fulvestrant. She is will to start with injection  today. Due to her hypercalcemia and AKI, I will postpone to next week. -If she is able to tolerate Fulvestrant injection, we will consider adding Piqray after 1-2 months treatment.  -Labs reviewed, Hg at 5.8, WBC at 3.8, PLT at 124K, K at 2.6, Ca at 15.5, Cr at 1.40. Given her symptomatic Hypercalcemia and Hypokalemia I will hold on her fulvestrant today and admit her to [facility] hospital for IV Fluids, IV Biphosphonate and IV Potassium.   4.Severe anemia  -She developed worsening anemia in September 2019, lowest hemoglobin 3.9, likely secondary to [place] and diffuse bone metastasis  -She is a Jehovah witness, refuses blood transfusion -She has been on Aranesp injection since then, anemia slowly improving. Will continue 520m injections every 2 weeks, will stop if Hb above 7-8 -will continue B12 injection monthly -Hg at 5.8 today (04/18/18). Will receive Aranesp injection today  5. Right LE DVT, Recent PE -She was advised to restarted Lovenox 86m but stopped on her own due to the concerns of bleeding -She was recently hospitalized on 02/27/18 for multiple PE. This is likely related to metastatic cancer, immobility and Aranesp injections.Patient wishes to continue Aranesp injection despite the risk of thrombosis. -She has repeatedly declined thrombectomy and Angovac which was offered by IR Dr. McGeroge Baseman-Her ankle swelling has significantly decreased but still has stable swelling in her knee and upper leg. Continue Lovenox.   6. Cough and Dyspnea -f/u with pulmonologist Dr. WeMelvyn Novason Flovent, Cough improved with Hycodan, continue  -Cough is stable.   7. Body pain -She has been having bilateral rib cage pain, probably related to her bone metastasis -Take tramadol as needed  -Body pain is stable.   8. Goal of care discussion  -We again discussed the incurable nature of her cancer, and the overall poor prognosis, especially if she does not have good response to chemotherapy or progress  on chemo -The patient understands the goal of care is palliative. -I recommend DNR/DNI, we had long conversation about her goal of care, her friends also encouraged her to consider DNR, she will think about it, will discuss with her children when they come next month    Plan [facility], AKI and hypokalemia. I spoke with Dr. OrOlevia Bowensho will admit her to tele bed   -We will start IV fluids with potassium in our infusion room while she is waiting for bed  -Please give pamidronate [id number]  -She is a Jehovah witness, please be judicious about blood draw -will f/u on Monday if she is still in house, otherwise will see her back next week to start fulvestrant injection -She received Aranesp injection in the infusion room today.    No problem-specific Assessment & Plan notes found for this encounter.   No orders of the defined types were placed in this encounter.  All questions were answered. The patient knows to call the clinic with any problems, questions or concerns. No barriers to learning was detected. I spent 30 minutes counseling the patient face to face. The total time spent in the appointment was 40 minutes and more than 50% was on counseling and review of test results     YanTruitt MerleD 04/18/2018   I, AmoJoslyn Devonm acting as scribe for YanTruitt MerleD.   I have reviewed the above documentation for accuracy and completeness, and  I agree with the above.

## 2018-04-18 ENCOUNTER — Telehealth: Payer: Self-pay

## 2018-04-18 ENCOUNTER — Inpatient Hospital Stay: Payer: Medicare Other

## 2018-04-18 ENCOUNTER — Inpatient Hospital Stay (HOSPITAL_BASED_OUTPATIENT_CLINIC_OR_DEPARTMENT_OTHER): Payer: Medicare Other | Admitting: Hematology

## 2018-04-18 ENCOUNTER — Encounter: Payer: Self-pay | Admitting: Hematology

## 2018-04-18 ENCOUNTER — Inpatient Hospital Stay (HOSPITAL_COMMUNITY)
Admission: AD | Admit: 2018-04-18 | Discharge: 2018-04-22 | DRG: 641 | Disposition: A | Discharge status: Home Health | Payer: Medicare Other | Source: Ambulatory Visit | Attending: Internal Medicine | Admitting: Internal Medicine

## 2018-04-18 ENCOUNTER — Inpatient Hospital Stay (HOSPITAL_COMMUNITY): Payer: Medicare Other

## 2018-04-18 VITALS — BP 149/73 | HR 82 | Temp 98.3°F | Resp 16 | Ht 67.0 in | Wt 228.0 lb

## 2018-04-18 DIAGNOSIS — R300 Dysuria: Secondary | ICD-10-CM

## 2018-04-18 DIAGNOSIS — R51 Headache: Secondary | ICD-10-CM

## 2018-04-18 DIAGNOSIS — K219 Gastro-esophageal reflux disease without esophagitis: Secondary | ICD-10-CM | POA: Diagnosis present

## 2018-04-18 DIAGNOSIS — Z7189 Other specified counseling: Secondary | ICD-10-CM

## 2018-04-18 DIAGNOSIS — C7951 Secondary malignant neoplasm of bone: Secondary | ICD-10-CM | POA: Diagnosis present

## 2018-04-18 DIAGNOSIS — E876 Hypokalemia: Secondary | ICD-10-CM

## 2018-04-18 DIAGNOSIS — Z885 Allergy status to narcotic agent status: Secondary | ICD-10-CM

## 2018-04-18 DIAGNOSIS — E871 Hypo-osmolality and hyponatremia: Secondary | ICD-10-CM | POA: Diagnosis present

## 2018-04-18 DIAGNOSIS — E785 Hyperlipidemia, unspecified: Secondary | ICD-10-CM | POA: Diagnosis present

## 2018-04-18 DIAGNOSIS — Z9012 Acquired absence of left breast and nipple: Secondary | ICD-10-CM | POA: Diagnosis not present

## 2018-04-18 DIAGNOSIS — Z87891 Personal history of nicotine dependence: Secondary | ICD-10-CM

## 2018-04-18 DIAGNOSIS — I5030 Unspecified diastolic (congestive) heart failure: Secondary | ICD-10-CM | POA: Diagnosis not present

## 2018-04-18 DIAGNOSIS — Z91048 Other nonmedicinal substance allergy status: Secondary | ICD-10-CM

## 2018-04-18 DIAGNOSIS — Z531 Procedure and treatment not carried out because of patient's decision for reasons of belief and group pressure: Secondary | ICD-10-CM | POA: Diagnosis present

## 2018-04-18 DIAGNOSIS — D61818 Other pancytopenia: Secondary | ICD-10-CM | POA: Diagnosis present

## 2018-04-18 DIAGNOSIS — Z79899 Other long term (current) drug therapy: Secondary | ICD-10-CM

## 2018-04-18 DIAGNOSIS — C50812 Malignant neoplasm of overlapping sites of left female breast: Secondary | ICD-10-CM

## 2018-04-18 DIAGNOSIS — R531 Weakness: Secondary | ICD-10-CM

## 2018-04-18 DIAGNOSIS — Z86711 Personal history of pulmonary embolism: Secondary | ICD-10-CM

## 2018-04-18 DIAGNOSIS — Z86718 Personal history of other venous thrombosis and embolism: Secondary | ICD-10-CM | POA: Diagnosis not present

## 2018-04-18 DIAGNOSIS — N179 Acute kidney failure, unspecified: Secondary | ICD-10-CM

## 2018-04-18 DIAGNOSIS — I110 Hypertensive heart disease with heart failure: Secondary | ICD-10-CM | POA: Diagnosis present

## 2018-04-18 DIAGNOSIS — I5032 Chronic diastolic (congestive) heart failure: Secondary | ICD-10-CM | POA: Diagnosis present

## 2018-04-18 DIAGNOSIS — C785 Secondary malignant neoplasm of large intestine and rectum: Secondary | ICD-10-CM

## 2018-04-18 DIAGNOSIS — R52 Pain, unspecified: Secondary | ICD-10-CM

## 2018-04-18 DIAGNOSIS — Z170 Estrogen receptor positive status [ER+]: Secondary | ICD-10-CM | POA: Diagnosis not present

## 2018-04-18 DIAGNOSIS — E869 Volume depletion, unspecified: Secondary | ICD-10-CM | POA: Diagnosis present

## 2018-04-18 DIAGNOSIS — D630 Anemia in neoplastic disease: Secondary | ICD-10-CM | POA: Diagnosis present

## 2018-04-18 DIAGNOSIS — Z9221 Personal history of antineoplastic chemotherapy: Secondary | ICD-10-CM

## 2018-04-18 DIAGNOSIS — R0989 Other specified symptoms and signs involving the circulatory and respiratory systems: Secondary | ICD-10-CM

## 2018-04-18 DIAGNOSIS — Z923 Personal history of irradiation: Secondary | ICD-10-CM | POA: Diagnosis not present

## 2018-04-18 DIAGNOSIS — J302 Other seasonal allergic rhinitis: Secondary | ICD-10-CM | POA: Diagnosis present

## 2018-04-18 DIAGNOSIS — Z888 Allergy status to other drugs, medicaments and biological substances status: Secondary | ICD-10-CM

## 2018-04-18 DIAGNOSIS — C50919 Malignant neoplasm of unspecified site of unspecified female breast: Secondary | ICD-10-CM | POA: Diagnosis not present

## 2018-04-18 DIAGNOSIS — I2699 Other pulmonary embolism without acute cor pulmonale: Secondary | ICD-10-CM

## 2018-04-18 DIAGNOSIS — Z91040 Latex allergy status: Secondary | ICD-10-CM

## 2018-04-18 DIAGNOSIS — C50912 Malignant neoplasm of unspecified site of left female breast: Secondary | ICD-10-CM

## 2018-04-18 DIAGNOSIS — I82401 Acute embolism and thrombosis of unspecified deep veins of right lower extremity: Secondary | ICD-10-CM

## 2018-04-18 DIAGNOSIS — N882 Stricture and stenosis of cervix uteri: Secondary | ICD-10-CM | POA: Diagnosis present

## 2018-04-18 DIAGNOSIS — D649 Anemia, unspecified: Secondary | ICD-10-CM

## 2018-04-18 DIAGNOSIS — Z95828 Presence of other vascular implants and grafts: Secondary | ICD-10-CM

## 2018-04-18 DIAGNOSIS — Z803 Family history of malignant neoplasm of breast: Secondary | ICD-10-CM

## 2018-04-18 LAB — CBC WITH DIFFERENTIAL/PLATELET
Abs Immature Granulocytes: 0.17 10*3/uL — ABNORMAL HIGH (ref 0.00–0.07)
BASOS PCT: 1 %
Basophils Absolute: 0 10*3/uL (ref 0.0–0.1)
Eosinophils Absolute: 0.1 10*3/uL (ref 0.0–0.5)
Eosinophils Relative: 3 %
HCT: 19.9 % — ABNORMAL LOW (ref 36.0–46.0)
Hemoglobin: 5.8 g/dL — CL (ref 12.0–15.0)
Immature Granulocytes: 5 %
Lymphocytes Relative: 29 %
Lymphs Abs: 1.1 10*3/uL (ref 0.7–4.0)
MCH: 27.9 pg (ref 26.0–34.0)
MCHC: 29.1 g/dL — ABNORMAL LOW (ref 30.0–36.0)
MCV: 95.7 fL (ref 80.0–100.0)
MONOS PCT: 11 %
Monocytes Absolute: 0.4 10*3/uL (ref 0.1–1.0)
Neutro Abs: 1.9 10*3/uL (ref 1.7–7.7)
Neutrophils Relative %: 51 %
Platelets: 124 10*3/uL — ABNORMAL LOW (ref 150–400)
RBC: 2.08 MIL/uL — ABNORMAL LOW (ref 3.87–5.11)
RDW: 18.7 % — AB (ref 11.5–15.5)
WBC: 3.8 10*3/uL — ABNORMAL LOW (ref 4.0–10.5)
nRBC: 0.5 % — ABNORMAL HIGH (ref 0.0–0.2)

## 2018-04-18 LAB — CBC
HCT: 18.3 % — ABNORMAL LOW (ref 36.0–46.0)
Hemoglobin: 5.2 g/dL — CL (ref 12.0–15.0)
MCH: 28.1 pg (ref 26.0–34.0)
MCHC: 28.4 g/dL — ABNORMAL LOW (ref 30.0–36.0)
MCV: 98.9 fL (ref 80.0–100.0)
Platelets: 119 10*3/uL — ABNORMAL LOW (ref 150–400)
RBC: 1.85 MIL/uL — ABNORMAL LOW (ref 3.87–5.11)
RDW: 19 % — ABNORMAL HIGH (ref 11.5–15.5)
WBC: 3.6 10*3/uL — AB (ref 4.0–10.5)
nRBC: 6.1 % — ABNORMAL HIGH (ref 0.0–0.2)

## 2018-04-18 LAB — CMP (CANCER CENTER ONLY)
ALT: 9 U/L (ref 0–44)
AST: 24 U/L (ref 15–41)
Albumin: 3.5 g/dL (ref 3.5–5.0)
Alkaline Phosphatase: 116 U/L (ref 38–126)
Anion gap: 11 (ref 5–15)
BUN: 11 mg/dL (ref 6–20)
CO2: 31 mmol/L (ref 22–32)
Calcium: 15.5 mg/dL (ref 8.9–10.3)
Chloride: 100 mmol/L (ref 98–111)
Creatinine: 1.4 mg/dL — ABNORMAL HIGH (ref 0.44–1.00)
GFR, Est AFR Am: 47 mL/min — ABNORMAL LOW (ref >60)
GFR, Estimated: 41 mL/min — ABNORMAL LOW (ref >60)
Glucose, Bld: 122 mg/dL — ABNORMAL HIGH (ref 70–99)
Potassium: 2.6 mmol/L — CL (ref 3.5–5.1)
Sodium: 142 mmol/L (ref 135–145)
Total Bilirubin: 1.5 mg/dL — ABNORMAL HIGH (ref 0.3–1.2)
Total Protein: 7.6 g/dL (ref 6.5–8.1)

## 2018-04-18 LAB — COMPREHENSIVE METABOLIC PANEL
ALT: 13 U/L (ref 0–44)
AST: 29 U/L (ref 15–41)
Albumin: 3.9 g/dL (ref 3.5–5.0)
Alkaline Phosphatase: 93 U/L (ref 38–126)
Anion gap: 10 (ref 5–15)
BUN: 12 mg/dL (ref 6–20)
CO2: 31 mmol/L (ref 22–32)
Calcium: 14.4 mg/dL (ref 8.9–10.3)
Chloride: 102 mmol/L (ref 98–111)
Creatinine, Ser: 1.31 mg/dL — ABNORMAL HIGH (ref 0.44–1.00)
GFR calc Af Amer: 51 mL/min — ABNORMAL LOW (ref >60)
GFR calc non Af Amer: 44 mL/min — ABNORMAL LOW (ref >60)
Glucose, Bld: 112 mg/dL — ABNORMAL HIGH (ref 70–99)
Potassium: 2.7 mmol/L — CL (ref 3.5–5.1)
Sodium: 143 mmol/L (ref 135–145)
Total Bilirubin: 1.7 mg/dL — ABNORMAL HIGH (ref 0.3–1.2)
Total Protein: 7.7 g/dL (ref 6.5–8.1)

## 2018-04-18 LAB — PHOSPHORUS: Phosphorus: 4.2 mg/dL (ref 2.5–4.6)

## 2018-04-18 LAB — MAGNESIUM: MAGNESIUM: 2 mg/dL (ref 1.7–2.4)

## 2018-04-18 MED ORDER — FULVESTRANT 250 MG/5ML IM SOLN
INTRAMUSCULAR | Status: AC
  Filled 2018-04-18: qty 10

## 2018-04-18 MED ORDER — FULVESTRANT 250 MG/5ML IM SOLN: 500.0000 mg | Freq: Once | INTRAMUSCULAR | Status: DC

## 2018-04-18 MED ORDER — POTASSIUM CHLORIDE CRYS ER 20 MEQ PO TBCR
40.0000 meq | EXTENDED_RELEASE_TABLET | Freq: Two times a day (BID) | ORAL | Status: AC
  Administered 2018-04-18 – 2018-04-19 (×2): 40 meq via ORAL
  Filled 2018-04-18 (×2): qty 2

## 2018-04-18 MED ORDER — POTASSIUM CHLORIDE IN NACL 20-0.9 MEQ/L-% IV SOLN
Freq: Once | INTRAVENOUS | Status: DC
  Administered 2018-04-18: 16:00:00 via INTRAVENOUS
  Filled 2018-04-18: qty 1000

## 2018-04-18 MED ORDER — VITAMIN B-12 1000 MCG PO TABS
1000.0000 ug | ORAL_TABLET | Freq: Every day | ORAL | Status: DC
  Administered 2018-04-18 – 2018-04-22 (×5): 1000 ug via ORAL
  Filled 2018-04-18 (×5): qty 1

## 2018-04-18 MED ORDER — FLUTICASONE FUROATE-VILANTEROL 100-25 MCG/INH IN AEPB
1.0000 | INHALATION_SPRAY | Freq: Every day | RESPIRATORY_TRACT | Status: DC
  Administered 2018-04-18 – 2018-04-21 (×4): 1 via RESPIRATORY_TRACT
  Filled 2018-04-18: qty 28

## 2018-04-18 MED ORDER — CYANOCOBALAMIN 1000 MCG/ML IJ SOLN
INTRAMUSCULAR | Status: AC
  Filled 2018-04-18: qty 1

## 2018-04-18 MED ORDER — ZOLEDRONIC ACID 4 MG/5ML IV CONC
3.5000 mg | Freq: Once | INTRAVENOUS | Status: AC
  Administered 2018-04-18: 3.5 mg via INTRAVENOUS
  Filled 2018-04-18: qty 4.38

## 2018-04-18 MED ORDER — SENNOSIDES-DOCUSATE SODIUM 8.6-50 MG PO TABS
2.0000 | ORAL_TABLET | Freq: Two times a day (BID) | ORAL | Status: DC
  Administered 2018-04-18 – 2018-04-22 (×8): 2 via ORAL
  Filled 2018-04-18 (×8): qty 2

## 2018-04-18 MED ORDER — HYDRALAZINE HCL 20 MG/ML IJ SOLN: 10.0000 mg | Freq: Four times a day (QID) | INTRAMUSCULAR | Status: DC | PRN

## 2018-04-18 MED ORDER — DARBEPOETIN ALFA 500 MCG/ML IJ SOSY
PREFILLED_SYRINGE | INTRAMUSCULAR | Status: AC
  Filled 2018-04-18: qty 1

## 2018-04-18 MED ORDER — SODIUM CHLORIDE 0.45 % IV SOLN
INTRAVENOUS | Status: DC
  Administered 2018-04-19 – 2018-04-20 (×2): via INTRAVENOUS

## 2018-04-18 MED ORDER — ACETAMINOPHEN 325 MG PO TABS
650.0000 mg | ORAL_TABLET | Freq: Four times a day (QID) | ORAL | Status: DC | PRN
  Administered 2018-04-20: 650 mg via ORAL
  Filled 2018-04-18: qty 2

## 2018-04-18 MED ORDER — FOLIC ACID 1 MG PO TABS
1.0000 mg | ORAL_TABLET | Freq: Every day | ORAL | Status: DC
  Administered 2018-04-18 – 2018-04-22 (×5): 1 mg via ORAL
  Filled 2018-04-18 (×5): qty 1

## 2018-04-18 MED ORDER — DARBEPOETIN ALFA 500 MCG/ML IJ SOSY
500.0000 ug | PREFILLED_SYRINGE | Freq: Once | INTRAMUSCULAR | Status: AC
  Administered 2018-04-18: 500 ug via SUBCUTANEOUS

## 2018-04-18 MED ORDER — IPRATROPIUM-ALBUTEROL 0.5-2.5 (3) MG/3ML IN SOLN: 3.0000 mL | Freq: Four times a day (QID) | RESPIRATORY_TRACT | Status: DC | PRN

## 2018-04-18 MED ORDER — ENOXAPARIN SODIUM 120 MG/0.8ML ~~LOC~~ SOLN
1.0000 mg/kg | Freq: Two times a day (BID) | SUBCUTANEOUS | Status: DC
  Administered 2018-04-18 – 2018-04-19 (×2): 110 mg via SUBCUTANEOUS
  Filled 2018-04-18 (×2): qty 0.73

## 2018-04-18 MED ORDER — SODIUM CHLORIDE 0.9 % IV SOLN: INTRAVENOUS | Status: DC

## 2018-04-18 MED ORDER — ONDANSETRON HCL 4 MG/2ML IJ SOLN
4.0000 mg | Freq: Four times a day (QID) | INTRAMUSCULAR | Status: DC | PRN
  Administered 2018-04-19: 4 mg via INTRAVENOUS
  Filled 2018-04-18: qty 2

## 2018-04-18 MED ORDER — FUROSEMIDE 10 MG/ML IJ SOLN
40.0000 mg | Freq: Once | INTRAMUSCULAR | Status: AC
  Administered 2018-04-19: 40 mg via INTRAVENOUS
  Filled 2018-04-18: qty 4

## 2018-04-18 MED ORDER — HYDROMORPHONE HCL 1 MG/ML IJ SOLN
0.5000 mg | INTRAMUSCULAR | Status: DC | PRN
  Administered 2018-04-20 (×2): 0.5 mg via INTRAVENOUS
  Filled 2018-04-18 (×3): qty 1

## 2018-04-18 MED ORDER — LORATADINE 10 MG PO TABS
10.0000 mg | ORAL_TABLET | Freq: Every day | ORAL | Status: DC
  Administered 2018-04-18 – 2018-04-22 (×5): 10 mg via ORAL
  Filled 2018-04-18 (×5): qty 1

## 2018-04-18 MED ORDER — CYANOCOBALAMIN 1000 MCG/ML IJ SOLN: 1000.0000 ug | Freq: Once | INTRAMUSCULAR | Status: DC

## 2018-04-18 NOTE — Telephone Encounter (Signed)
Call placed to [facility], spoke to [place] who stated that the referral was received and assessment is scheduled for 04/23/2018

## 2018-04-18 NOTE — Progress Notes (Signed)
Pt d/c from [facility] and admitted to [facility]. LOC X 3 at time of discharge.

## 2018-04-18 NOTE — H&P (Signed)
History and Physical  Michaela Little ZOX:[id number] DOB: Nov 11, 1960 DOA: 04/18/2018  Referring physician: Dr Burr Medico  PCP: Ladell Pier, MD  Outpatient Specialists: Medical oncology Patient coming from: Home  Chief Complaint: Direct transfer from medical oncology office, Dr. Ernestina Penna office, due to abnormal labs  HPI: Michaela Little is a 58 y.o. 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  She went to her routine follow-up appointment today.  Lab studies were remarkable for significant hypercalcemia, profound hypokalemia, severe normocytic anemia and AKI.  Patient reports in the last week she has had generalized weakness and diffuse bone pain.  Denies any fever chills but admits to dysuria and polyuria.  ED Course: Patient is a direct admit from oncologist office.  Review of Systems: Review of systems as noted in the HPI. All other systems reviewed and are negative.   Past Medical History:  Diagnosis Date  . Anxiety   . Arthritis    hands, knees, hips  . Asthma   . Bronchitis   . Cancer ([place])   . Cervical stenosis (uterine cervix)   . Disorder of appendix    Enlarged  . Dyspnea on exertion   . GERD (gastroesophageal reflux disease)   . Headache(784.0)   . History of breast cancer    2010--  LEFT  s/p  mastectomy (in [place]) AND CHEMORADIATION--  NO RECURRENCE  . History of cervical dysplasia   . Hyperlipidemia   . Hypertension   . Malignant neoplasm of overlapping sites of left breast in female, estrogen receptor positive ([place]) 03/05/2013  . OSA (obstructive sleep apnea) moderate osa per study  09/2010   CPAP  , NOT USING ON REGULAR BASIS  . Pelvic pain in female   . Positive H. pylori test    08-05-2013  . Positive TB test    AS TEEN--  [place] W/ MEDS  . Refusal  of blood transfusions as patient is Jehovah's Witness   . Seasonal allergies   . Uterine fibroid   . Wears glasses    Past Surgical History:  Procedure Laterality Date  . APPENDECTOMY  y  . CARDIOVASCULAR STRESS TEST  10-29-2012   low risk perfusion study/  no significant reversibity/ ef 66%/  normal wall motion  . CERVICAL CONIZATION W/BX  2012   in  [place] N/A 05/14/2012   Procedure: LAPAROSCOPIC CHOLECYSTECTOMY;  Surgeon: Ralene Ok, MD;  Location: [place];  Service: General;  Laterality: N/A;  . COLONOSCOPY  2011   normal per patient - [place]  . DILATION AND CURETTAGE OF UTERUS  10/15/2011   Procedure: DILATATION AND CURETTAGE;  Surgeon: Melina Schools, MD;  Location: [place] [facility];  Service: Gynecology;  Laterality: N/A;  Conization &  endocervical curettings  . EXAMINATION UNDER ANESTHESIA N/A 08/20/2013   Procedure: EXAM UNDER ANESTHESIA;  Surgeon: Margarette Asal, MD;  Location: [facility];  Service: Gynecology;  Laterality: N/A;  . [place]   right  . IR IVC FILTER PLMT / S&I /IMG GUID/MOD SED  03/01/2018  . IR RADIOLOGIST EVAL & MGMT  04/08/2018  . LAPAROSCOPIC APPENDECTOMY N/A 08/22/2017   Procedure: [place];  Surgeon: Coralie Keens, MD;  Location: [place];  Service: General;  Laterality: N/A;  . LAPAROSCOPIC ASSISTED VAGINAL  HYSTERECTOMY N/A 10/26/2014   Procedure: HYSTERECTOMY ABDOMINAL ;  Surgeon: Molli Posey, MD;  Location: [place] [facility];  Service: Gynecology;  Laterality: N/A;  . MASTECTOMY Left 11/2008  in [place]   . SALPINGOOPHORECTOMY Bilateral 10/26/2014   Procedure: BILATERAL SALPINGO OOPHORECTOMY;  Surgeon: Molli Posey, MD;  Location: [place] [facility];  Service: Gynecology;  Laterality: Bilateral;  . TISSUE EXPANDER PLACEMENT Left 08/22/2017   Procedure: REMOVAL OF LEFT TISSUE EXPANDER;  Surgeon: Crissie Reese, MD;  Location: [place];  Service: Plastics;  Laterality: Left;  . TISSUE EXPANDER REMOVAL  Left 08/22/2017  . TRANSTHORACIC ECHOCARDIOGRAM  10-29-2012   mild lvh/  ef 60-65%/  grade II diastolic dysfunction/  trivial mr  &  tr    Social History:  reports that she quit smoking about 30 years ago. Her smoking use included cigarettes. She has a 3.00 pack-year smoking history. She has never used smokeless tobacco. She reports that she does not drink alcohol or use drugs.   Allergies  Allergen Reactions  . Lisinopril-Hydrochlorothiazide Itching  . Adhesive [Tape] Itching and Other (See Comments)    Redness  . Fentanyl Other (See Comments)    GI upset and drowsiness. Only to [facility]  . Gabapentin Other (See Comments)    Auditory hallucinations  . Hctz [Hydrochlorothiazide] Itching  . Latex Itching  . Lisinopril Itching  . Losartan Potassium Other (See Comments)    Makes her feel "bad"   . Other Other (See Comments)    Patient refuses blood for religious reasons (per her notes)  . Oxycodone     GI upset, drowsy  . Tums [Calcium Carbonate Antacid] Hives    FRUIT-FLAVORED ONES    Family History  Problem Relation Age of Onset  . Breast cancer Mother   . Colon cancer Mother   . Hypotension Mother   . Asthma Mother   . Diabetes type II Mother   . Arthritis Mother   . Clotting disorder Mother   . Cancer Mother        breast/colon  . Mental illness Brother   . Heart disease Brother   . Cerebral palsy Daughter   . Emphysema Brother        never smoker  . Colon cancer Maternal Aunt   . Cancer Maternal Aunt        colon  . Colon cancer Maternal Uncle   . Cancer Maternal Uncle        colon  . Esophageal cancer Neg Hx   . Rectal cancer Neg Hx   . Stomach cancer Neg Hx   . Thyroid disease Neg Hx       Prior to Admission medications   Medication Sig Start Date End Date Taking? Authorizing Provider  cetirizine (ZYRTEC) 10 MG tablet Take 10 mg by mouth daily. 12/19/17   [provider]  enoxaparin (LOVENOX) 120 MG/0.8ML injection Inject 0.73 mLs (110  mg total) into the skin every 12 (twelve) hours. 03/21/18 04/20/18  [name], MD  exemestane (AROMASIN) 25 MG tablet Take 1 tablet (25 mg total) by mouth daily after breakfast. 02/13/18   [name], MD  fluticasone furoate-vilanterol (BREO ELLIPTA) 100-25 MCG/INH AEPB Inhale 1 puff into the lungs daily. 10/18/17   Martyn Ehrich, NP  folic acid (FOLVITE) 1 MG tablet Take 1 tablet (1 mg total) by mouth daily. 01/05/18   Geradine Girt, DO  HYDROcodone-homatropine (HYCODAN) 5-1.5 MG/5ML syrup Take 5 mLs by mouth every 6 (six) hours  as needed for cough. 03/21/18   [name], MD  ipratropium-albuterol (DUONEB) 0.5-2.5 (3) MG/3ML SOLN INHALE THREE MLS VIA NEBULIZER EVERY 6 HOURS AS NEEDED Patient taking differently: Take 3 mLs by nebulization every 6 (six) hours as needed (for wheezing or shortness of breath).  02/13/18   Clent Demark, PA-C  Multiple Vitamin (MULTIVITAMIN) tablet Take 1 tablet by mouth daily.    [provider]  traMADol (ULTRAM) 50 MG tablet Take 2 tablets (100 mg total) by mouth every 6 (six) hours. 03/08/18   Cristal Ford, DO  vitamin B-12 1000 MCG tablet Take 1 tablet (1,000 mcg total) by mouth daily. 01/05/18   Geradine Girt, DO    Physical Exam: BP (!) 160/78 (BP Location: Right Arm)   Pulse 66   Temp 98.3 F (36.8 C)   Resp 12   LMP 02/25/2009   SpO2 99%   . General: 58 y.o. year-old female well developed well nourished in no acute distress.  Alert and oriented x3. . Cardiovascular: Regular rate and rhythm with no rubs or gallops.  No thyromegaly or JVD noted.  No lower extremity edema. 2/4 pulses in all 4 extremities. Marland Kitchen Respiratory: Mild rales at bases with no wheezes. Good inspiratory effort. . Abdomen: Soft nontender nondistended with normal bowel sounds x4 quadrants. . Muskuloskeletal: No cyanosis, clubbing or edema noted bilaterally . Neuro: CN II-XII intact, strength, sensation, reflexes . Skin: No ulcerative lesions noted or  rashes . Psychiatry: Judgement and insight appear normal. Mood is appropriate for condition and setting          Labs on Admission:  Basic Metabolic Panel: Recent Labs  Lab 04/18/18 1423  NA 142  K 2.6*  CL 100  CO2 31  GLUCOSE 122*  BUN 11  CREATININE 1.40*  CALCIUM 15.5*   Liver Function Tests: Recent Labs  Lab 04/18/18 1423  AST 24  ALT 9  ALKPHOS 116  BILITOT 1.5*  PROT 7.6  ALBUMIN 3.5   No results for input(s): LIPASE, AMYLASE in the last 168 hours. No results for input(s): AMMONIA in the last 168 hours. CBC: Recent Labs  Lab 04/18/18 1423  WBC 3.8*  NEUTROABS 1.9  HGB 5.8*  HCT 19.9*  MCV 95.7  PLT 124*   Cardiac Enzymes: No results for input(s): CKTOTAL, CKMB, CKMBINDEX, TROPONINI in the last 168 hours.  BNP (last 3 results) Recent Labs    09/25/17 1420 01/01/18 1838 02/27/18 1237  BNP 20.4 25.8 15.6    ProBNP (last 3 results) No results for input(s): PROBNP in the last 8760 hours.  CBG: No results for input(s): GLUCAP in the last 168 hours.  Radiological Exams on Admission: No results found.  EKG: I independently viewed the EKG done and my findings are as followed: None available at the time of this visit.  Assessment/Plan Present on Admission: . Hypercalcemia  Active Problems:   Hypercalcemia   Severe symptomatic hypercalcemia suspect secondary to her malignancy with bone metastasis Calcium 15.5 Start IV fluid hydration half-normal saline at 100 cc/h Start IV biphosphonate Monitor urine output closely Repeat chemistry panel Obtain admission chest x-ray and EKG Dr. Burr Medico, medical oncology will be consulted  Hypokalemia Presented with potassium 2.8 Replete as indicated Obtain magnesium level Repeat BMP in the morning  Severe normocytic anemia Hemoglobin 5.8, MCV 95 Refuses blood transfusion due to religious reasons Last FOBT was negative in January 01, 2018 Obtain FOBT  If FOBT positive may consider holding off full  dose Lovenox  AKI Baseline creatinine 0.7 Presented with creatinine of 1.4 Continue IV fluid hydration Monitor urine output closely Repeat BMP in the morning  Metastatic breast cancer status post left mastectomy Metastasis to bone and appendix Management per medical oncology Dr. Burr Medico will be consulted  History of familial DVT and recent PE On full dose Lovenox Also has an IVC filter in place  Generalized weakness/physical debility suspect secondary to her malignancy Fall precautions PT to assess  Dysuria/polyuria Obtain urine analysis and urine culture Treat as indicated  Chronic diastolic CHF Last 2D echo done on 03/02/2018 revealed LVEF 65 to 70% with grade 1 diastolic dysfunction Appears euvolemic Start strict I's and O's and daily weight   Risks: High risk for decompensation due to severe dyselectrolytemia, severe normocytic anemia refusing blood transfusions, multiple comorbidities and advanced age.  Patient will require at least 2 midnights for further evaluation and treatment of present condition.    DVT prophylaxis: Full dose Lovenox  Code Status: Full code  Family Communication: Daughter at bedside.  All questions answered to their satisfaction.  Disposition Plan: Admit to telemetry unit  Consults called: We will consult medical oncology in the morning  Admission status: Inpatient status    Kayleen Memos MD [facility] Pager [phone]  If 7PM-7AM, please contact night-coverage [email or web address] Password [facility]  04/18/2018, 6:04 PM

## 2018-04-18 NOTE — Progress Notes (Signed)
Per Valda Favia, RN via Dr. Burr Medico, pt only to receive aranesp injection today in addtion to IVF

## 2018-04-18 NOTE — Progress Notes (Signed)
CRITICAL VALUE ALERT  Critical Value: Potassium 2.7;calcium 14.4  Date & Time Notied:  04/18/18;2034  Provider Notified: yes  Orders Received/Actions taken:Awaiting new orders.

## 2018-04-18 NOTE — Progress Notes (Signed)
Call bed assignment at 1525 [phone] Dr. Olevia Bowens accepting need Telemetry bed.  Will call back with bed.

## 2018-04-18 NOTE — Patient Instructions (Signed)

## 2018-04-18 NOTE — Progress Notes (Signed)
CRITICAL VALUE ALERT  Critical Value:  Hemoglobin 5.2  Date & Time Notied: 04/18/18;1944  Provider Notified: Yes  Orders Received/Actions taken: Awaiting any new orders

## 2018-04-19 ENCOUNTER — Other Ambulatory Visit: Payer: Self-pay

## 2018-04-19 ENCOUNTER — Encounter (HOSPITAL_COMMUNITY): Payer: Self-pay | Admitting: *Deleted

## 2018-04-19 LAB — COMPREHENSIVE METABOLIC PANEL
ALT: 12 U/L (ref 0–44)
ALT: 14 U/L (ref 0–44)
AST: 30 U/L (ref 15–41)
AST: 32 U/L (ref 15–41)
Albumin: 3.5 g/dL (ref 3.5–5.0)
Albumin: 3.8 g/dL (ref 3.5–5.0)
Alkaline Phosphatase: 90 U/L (ref 38–126)
Alkaline Phosphatase: 94 U/L (ref 38–126)
Anion gap: 9 (ref 5–15)
Anion gap: 8 (ref 5–15)
BUN: 13 mg/dL (ref 6–20)
BUN: 14 mg/dL (ref 6–20)
CO2: 29 mmol/L (ref 22–32)
CO2: 29 mmol/L (ref 22–32)
Calcium: >15 mg/dL (ref 8.9–10.3)
Calcium: 14.5 mg/dL (ref 8.9–10.3)
Chloride: 105 mmol/L (ref 98–111)
Chloride: 107 mmol/L (ref 98–111)
Creatinine, Ser: 1.52 mg/dL — ABNORMAL HIGH (ref 0.44–1.00)
Creatinine, Ser: 1.54 mg/dL — ABNORMAL HIGH (ref 0.44–1.00)
GFR calc Af Amer: 43 mL/min — ABNORMAL LOW (ref >60)
GFR calc Af Amer: 42 mL/min — ABNORMAL LOW (ref >60)
GFR calc non Af Amer: 36 mL/min — ABNORMAL LOW (ref >60)
GFR, EST NON AFRICAN AMERICAN: 37 mL/min — AB (ref >60)
GLUCOSE: 116 mg/dL — AB (ref 70–99)
Glucose, Bld: 137 mg/dL — ABNORMAL HIGH (ref 70–99)
Potassium: 3.1 mmol/L — ABNORMAL LOW (ref 3.5–5.1)
Potassium: 3 mmol/L — ABNORMAL LOW (ref 3.5–5.1)
Sodium: 143 mmol/L (ref 135–145)
Sodium: 144 mmol/L (ref 135–145)
Total Bilirubin: 1.4 mg/dL — ABNORMAL HIGH (ref 0.3–1.2)
Total Bilirubin: 1.2 mg/dL (ref 0.3–1.2)
Total Protein: 6.9 g/dL (ref 6.5–8.1)
Total Protein: 7.3 g/dL (ref 6.5–8.1)

## 2018-04-19 LAB — URINALYSIS, ROUTINE W REFLEX MICROSCOPIC
Bacteria, UA: NONE SEEN
Bilirubin Urine: NEGATIVE
Glucose, UA: NEGATIVE mg/dL
Ketones, ur: NEGATIVE mg/dL
LEUKOCYTES UA: NEGATIVE
Nitrite: NEGATIVE
Protein, ur: NEGATIVE mg/dL
Specific Gravity, Urine: 1.003 — ABNORMAL LOW (ref 1.005–1.030)
pH: 8 (ref 5.0–8.0)

## 2018-04-19 LAB — CBC
HCT: 17.9 % — ABNORMAL LOW (ref 36.0–46.0)
Hemoglobin: 5.1 g/dL — CL (ref 12.0–15.0)
MCH: 27.6 pg (ref 26.0–34.0)
MCHC: 28.5 g/dL — ABNORMAL LOW (ref 30.0–36.0)
MCV: 96.8 fL (ref 80.0–100.0)
NRBC: 0.2 % (ref 0.0–0.2)
Platelets: 126 10*3/uL — ABNORMAL LOW (ref 150–400)
RBC: 1.85 MIL/uL — ABNORMAL LOW (ref 3.87–5.11)
RDW: 18.8 % — ABNORMAL HIGH (ref 11.5–15.5)
WBC: 3.5 10*3/uL — AB (ref 4.0–10.5)

## 2018-04-19 LAB — HEMOGLOBIN A1C
Hgb A1c MFr Bld: 5 % (ref 4.8–5.6)
Mean Plasma Glucose: 96.8 mg/dL

## 2018-04-19 LAB — GLUCOSE, CAPILLARY: Glucose-Capillary: 119 mg/dL — ABNORMAL HIGH (ref 70–99)

## 2018-04-19 MED ORDER — CALCITONIN (SALMON) 200 UNIT/ML IJ SOLN
400.0000 [IU] | Freq: Two times a day (BID) | INTRAMUSCULAR | Status: AC
  Administered 2018-04-19 – 2018-04-20 (×3): 400 [IU] via INTRAMUSCULAR
  Filled 2018-04-19 (×4): qty 2

## 2018-04-19 MED ORDER — AMLODIPINE BESYLATE 5 MG PO TABS: 5.0000 mg | ORAL_TABLET | Freq: Every day | ORAL | Status: DC

## 2018-04-19 MED ORDER — AMLODIPINE BESYLATE 5 MG PO TABS
5.0000 mg | ORAL_TABLET | Freq: Once | ORAL | Status: AC
  Administered 2018-04-19: 5 mg via ORAL
  Filled 2018-04-19: qty 1

## 2018-04-19 MED ORDER — POTASSIUM & SODIUM PHOSPHATES 280-160-250 MG PO PACK
1.0000 | PACK | Freq: Three times a day (TID) | ORAL | Status: DC
  Administered 2018-04-19: 1 via ORAL
  Filled 2018-04-19 (×3): qty 1

## 2018-04-19 MED ORDER — BACLOFEN 10 MG PO TABS
10.0000 mg | ORAL_TABLET | Freq: Once | ORAL | Status: AC
  Administered 2018-04-19: 10 mg via ORAL
  Filled 2018-04-19: qty 1

## 2018-04-19 MED ORDER — POTASSIUM CHLORIDE CRYS ER 20 MEQ PO TBCR
40.0000 meq | EXTENDED_RELEASE_TABLET | Freq: Two times a day (BID) | ORAL | Status: AC
  Administered 2018-04-19 (×2): 40 meq via ORAL
  Filled 2018-04-19 (×2): qty 2

## 2018-04-19 MED ORDER — ENOXAPARIN SODIUM 120 MG/0.8ML ~~LOC~~ SOLN
100.0000 mg | Freq: Two times a day (BID) | SUBCUTANEOUS | Status: DC
  Administered 2018-04-19 – 2018-04-22 (×6): 100 mg via SUBCUTANEOUS
  Filled 2018-04-19 (×7): qty 0.67

## 2018-04-19 MED ORDER — AMLODIPINE BESYLATE 5 MG PO TABS
5.0000 mg | ORAL_TABLET | Freq: Every day | ORAL | Status: DC
  Administered 2018-04-19: 5 mg via ORAL
  Filled 2018-04-19: qty 1

## 2018-04-19 MED ORDER — ACETAMINOPHEN 325 MG PO TABS
325.0000 mg | ORAL_TABLET | Freq: Once | ORAL | Status: AC
  Administered 2018-04-19: 325 mg via ORAL
  Filled 2018-04-19: qty 1

## 2018-04-19 MED ORDER — METOPROLOL TARTRATE 5 MG/5ML IV SOLN
5.0000 mg | Freq: Once | INTRAVENOUS | Status: AC
  Administered 2018-04-19: 5 mg via INTRAVENOUS
  Filled 2018-04-19: qty 5

## 2018-04-19 MED ORDER — AMLODIPINE BESYLATE 10 MG PO TABS
10.0000 mg | ORAL_TABLET | Freq: Every day | ORAL | Status: DC
  Administered 2018-04-20 – 2018-04-22 (×3): 10 mg via ORAL
  Filled 2018-04-19 (×3): qty 1

## 2018-04-19 NOTE — Consult Note (Addendum)
Renal Service Consult Note [facility]  Michaela Little 04/19/2018 [name] Requesting Physician:  Dr Nevada Crane, C.   Reason for Consult:  Hypercalcemia HPI: The patient is a 58 y.o. year-old with hx of metastatic breast Ca, anemia, thrombocytopenia   Breast Ca dx'd in 2010 rx'd w/ chemoRx then L mod radical mastectomy.  Metastatic breast Ca to the bones since 2017, on letrozole and Ibrance. Letrozole dc'd in 01/2017 and Exemsestane started. Ibrance dc'd in 11/2017 for pancytopenia. Xgeva monthly.     admit may 2019 for lap appendectomy   admit oct 2019 for symptomatic anemia/ pancytopneia; denied prbc's (JF); rec'd Aranesp, onc said to consider BM Bx in 2 wks if not better. Also bronchitis and low SpO2's, CXR neg.  mucinex DM. Metastatic breast Ca w/ mets to bone and appendix. F/b Dr Burr Medico. Anemia prob due to [place]. Ibrance dc'd.    Had a new DVT in Nov 2019 and started on Lovenox SQ but had poor compliance.   From [place] visit Dec 2019 pt deconditioned, low plts from bony mets, severe anemia from bone mets, and severe R hip pain from extensive DVT. Hb only improved from 3.9 > 5.5 with Aranesp.   Admitted Dec 2019 for SOB due to new PE, hypercoag due to cancer, R hip pain, fever, RUE edema, severe anemia/ thrombocytopenia, COPD, GOC and deconditioning. Normal LVEF by echo. Rx lovenox injections. Hb 4.4. No blood transfusions (JW).  Today c/o severe fatigue, feeling "bad", nothing specific. No N/V or diarrhea, some abd cramping, no SOB.  No cough or fevers.  Peeing "a lot".  No hx Ca issues in the past.     ROS  denies CP  no joint pain   no HA  no blurry vision  no rash  no diarrhea  no nausea/ vomiting    Past Medical History  Past Medical History:  Diagnosis Date  . Anxiety   . Arthritis    hands, knees, hips  . Asthma   . Bronchitis   . Cancer ([place])   . Cervical stenosis (uterine cervix)   . Disorder of appendix    Enlarged  . Dyspnea on exertion    . GERD (gastroesophageal reflux disease)   . Headache(784.0)   . History of breast cancer    2010--  LEFT  s/p  mastectomy (in [place]) AND CHEMORADIATION--  NO RECURRENCE  . History of cervical dysplasia   . Hyperlipidemia   . Hypertension   . Malignant neoplasm of overlapping sites of left breast in female, estrogen receptor positive ([place]) 03/05/2013  . OSA (obstructive sleep apnea) moderate osa per study  09/2010   CPAP  , NOT USING ON REGULAR BASIS  . Pelvic pain in female   . Positive H. pylori test    08-05-2013  . Positive TB test    AS TEEN--  [place] W/ MEDS  . Refusal of blood transfusions as patient is Jehovah's Witness   . Seasonal allergies   . Uterine fibroid   . Wears glasses    Past Surgical History  Past Surgical History:  Procedure Laterality Date  . APPENDECTOMY  y  . CARDIOVASCULAR STRESS TEST  10-29-2012   low risk perfusion study/  no significant reversibity/ ef 66%/  normal wall motion  . CERVICAL CONIZATION W/BX  2012   in  [place] N/A 05/14/2012   Procedure: LAPAROSCOPIC CHOLECYSTECTOMY;  Surgeon: Ralene Ok, MD;  Location: [place];  Service: General;  Laterality: N/A;  .  COLONOSCOPY  2011   normal per patient - [place]  . DILATION AND CURETTAGE OF UTERUS  10/15/2011   Procedure: DILATATION AND CURETTAGE;  Surgeon: Melina Schools, MD;  Location: [place] [facility];  Service: Gynecology;  Laterality: N/A;  Conization &  endocervical curettings  . EXAMINATION UNDER ANESTHESIA N/A 08/20/2013   Procedure: EXAM UNDER ANESTHESIA;  Surgeon: Margarette Asal, MD;  Location: [facility];  Service: Gynecology;  Laterality: N/A;  . [place]   right  . IR IVC FILTER PLMT / S&I /IMG GUID/MOD SED  03/01/2018  . IR RADIOLOGIST EVAL & MGMT  04/08/2018  . LAPAROSCOPIC APPENDECTOMY N/A 08/22/2017   Procedure: [place];  Surgeon: Coralie Keens, MD;  Location: [place];  Service: General;  Laterality: N/A;  . LAPAROSCOPIC  ASSISTED VAGINAL HYSTERECTOMY N/A 10/26/2014   Procedure: HYSTERECTOMY ABDOMINAL ;  Surgeon: Molli Posey, MD;  Location: [place] [facility];  Service: Gynecology;  Laterality: N/A;  . MASTECTOMY Left 11/2008  in [place]   . SALPINGOOPHORECTOMY Bilateral 10/26/2014   Procedure: BILATERAL SALPINGO OOPHORECTOMY;  Surgeon: Molli Posey, MD;  Location: [place] [facility];  Service: Gynecology;  Laterality: Bilateral;  . TISSUE EXPANDER PLACEMENT Left 08/22/2017   Procedure: REMOVAL OF LEFT TISSUE EXPANDER;  Surgeon: Crissie Reese, MD;  Location: [place];  Service: Plastics;  Laterality: Left;  . TISSUE EXPANDER REMOVAL Left 08/22/2017  . TRANSTHORACIC ECHOCARDIOGRAM  10-29-2012   mild lvh/  ef 60-65%/  grade II diastolic dysfunction/  trivial mr  &  tr   Family History  Family History  Problem Relation Age of Onset  . Breast cancer Mother   . Colon cancer Mother   . Hypotension Mother   . Asthma Mother   . Diabetes type II Mother   . Arthritis Mother   . Clotting disorder Mother   . Cancer Mother        breast/colon  . Mental illness Brother   . Heart disease Brother   . Cerebral palsy Daughter   . Emphysema Brother        never smoker  . Colon cancer Maternal Aunt   . Cancer Maternal Aunt        colon  . Colon cancer Maternal Uncle   . Cancer Maternal Uncle        colon  . Esophageal cancer Neg Hx   . Rectal cancer Neg Hx   . Stomach cancer Neg Hx   . Thyroid disease Neg Hx    Social History  reports that she quit smoking about 30 years ago. Her smoking use included cigarettes. She has a 3.00 pack-year smoking history. She has never used smokeless tobacco. She reports that she does not drink alcohol or use drugs. Allergies  Allergies  Allergen Reactions  . Lisinopril-Hydrochlorothiazide Itching  . Adhesive [Tape] Itching and Other (See Comments)    Redness  . Fentanyl Other (See Comments)    GI upset and drowsiness. Only to [facility]  . Gabapentin Other (See Comments)     Auditory hallucinations  . Hctz [Hydrochlorothiazide] Itching  . Latex Itching  . Lisinopril Itching  . Losartan Potassium Other (See Comments)    Makes her feel "bad"   . Other Other (See Comments)    Patient refuses blood for religious reasons (per her notes)  . Oxycodone     GI upset, drowsy  . Tums [Calcium Carbonate Antacid] Hives    FRUIT-FLAVORED ONES   Home  medications Prior to Admission medications   Medication Sig Start Date End Date Taking? Authorizing Provider  cetirizine (ZYRTEC) 10 MG tablet Take 10 mg by mouth daily. 12/19/17  Yes [provider]  enoxaparin (LOVENOX) 120 MG/0.8ML injection Inject 0.73 mLs (110 mg total) into the skin every 12 (twelve) hours. 03/21/18 04/20/18 Yes [name], MD  folic acid (FOLVITE) 1 MG tablet Take 1 tablet (1 mg total) by mouth daily. 01/05/18  Yes Vann, Jessica U, DO  ipratropium-albuterol (DUONEB) 0.5-2.5 (3) MG/3ML SOLN INHALE THREE MLS VIA NEBULIZER EVERY 6 HOURS AS NEEDED Patient taking differently: Take 3 mLs by nebulization every 6 (six) hours as needed (for wheezing or shortness of breath).  02/13/18  Yes Clent Demark, PA-C  Multiple Vitamin (MULTIVITAMIN) tablet Take 1 tablet by mouth daily.   Yes [provider]  Multiple Vitamins-Minerals (MULTIVITAMIN ADULT PO) Take 1 tablet by mouth daily.   Yes [provider]  traMADol (ULTRAM) 50 MG tablet Take 2 tablets (100 mg total) by mouth every 6 (six) hours. Patient taking differently: Take 100 mg by mouth every 6 (six) hours as needed for moderate pain or severe pain.  03/08/18  Yes Mikhail, [place], DO  vitamin B-12 1000 MCG tablet Take 1 tablet (1,000 mcg total) by mouth daily. 01/05/18  Yes Geradine Girt, DO  exemestane (AROMASIN) 25 MG tablet Take 1 tablet (25 mg total) by mouth daily after breakfast. Patient not taking: Reported on 04/18/2018 02/13/18   [name], MD  fluticasone furoate-vilanterol (BREO ELLIPTA) 100-25 MCG/INH AEPB Inhale 1  puff into the lungs daily. Patient taking differently: Inhale 1 puff into the lungs daily as needed (sob and wheezing).  10/18/17   Martyn Ehrich, NP  HYDROcodone-homatropine [facility]) 5-1.5 MG/5ML syrup Take 5 mLs by mouth every 6 (six) hours as needed for cough. 03/21/18   [name], MD   Liver Function Tests Recent Labs  Lab 04/18/18 1849 04/19/18 0546 04/19/18 1237  AST 29 30 32  ALT 13 12 14   ALKPHOS 93 90 94  BILITOT 1.7* 1.4* 1.2  PROT 7.7 6.9 7.3  ALBUMIN 3.9 3.5 3.8   No results for input(s): LIPASE, AMYLASE in the last 168 hours. CBC Recent Labs  Lab 04/18/18 1423 04/18/18 1846 04/19/18 0546  WBC 3.8* 3.6* 3.5*  NEUTROABS 1.9  --   --   HGB 5.8* 5.2* 5.1*  HCT 19.9* 18.3* 17.9*  MCV 95.7 98.9 96.8  PLT 124* 119* 102*   Basic Metabolic Panel Recent Labs  Lab 04/18/18 1423 04/18/18 1846 04/18/18 1849 04/19/18 0546 04/19/18 1237  NA 142  --  143 143 144  K 2.6*  --  2.7* 3.1* 3.0*  CL 100  --  102 105 107  CO2 31  --  31 29 29   GLUCOSE 122*  --  112* 116* 137*  BUN 11  --  12 13 14   CREATININE 1.40*  --  1.31* 1.52* 1.54*  CALCIUM 15.5*  --  14.4* >15.0* 14.5*  PHOS  --  4.2  --   --   --    Iron/TIBC/Ferritin/ %Sat    Component Value Date/Time   IRON 182 (H) 01/01/2018 1838   TIBC 318 01/01/2018 1838   FERRITIN 935 (H) 01/01/2018 1838   IRONPCTSAT 57 (H) 01/01/2018 1838    Vitals:   04/19/18 0418 04/19/18 0615 04/19/18 1003 04/19/18 1334  BP: (!) 175/67 (!) 169/74  (!) 176/89  Pulse: 76 82  94  Resp: 16   (!)  23  Temp: 98.6 F (37 C)   99.5 F (37.5 C)  TempSrc: Oral   Oral  SpO2: 100% 94% 99% 100%  Weight:      Height:       Exam Gen eyes closed, looks very tired No rash, cyanosis or gangrene Sclera anicteric, throat slightly dry  No jvd or bruits, flat neck veins Chest clear bilat to bases RRR no MRG, tachy w/ forceful PMI Abd soft ntnd no mass or ascites +bs GU defer MS no joint effusions or deformity Ext no LE or UE  edema, no wounds or ulcers Neuro is alert, Ox 3 , nf    Home meds:  - fluticasone furoate-vilanterol 1 puff qd/ ipratropium-albuterol 3cc qid prn  - enoxaparin 110mg  bid sq  - vitamins/ prns   Na 144  K 3.0  CO2 29  BUN 14  Cr 1.54   Ca 14.5  Alb 3.8  LFT's ok  WBC 3k  Hb 5.1  plt 126  MCV 97  UA 1/24 negative  CXR 1/24 clear , +bony metastatic changes  baseline creat 0.72 in Jan 2020  Assessment: 1. Hypercalcemia of malignancy- pt has room for volume, will ^IVF's to 200 cc/hr and place Purewick.  Loops diuretics are not recommended unless heart failure or renal failure is present. Zoledronic acid and calcitonin already ordered. Will follow.  2. AKI - mild, creat 1.5 (baseline 0.8 early this year).  3. Metastatic breast Cancer - dx'd in 2010, metastatic to bones in 2017 4. Anemia severe - from bone mets, declines PRBC's due to religious belief 5. Leukopenia / Thrombocytopenia. - mild 6. Vol depletion - see above    P: 1. As above       Kelly Splinter MD [employer] pager [phone]   04/19/2018, 2:51 PM

## 2018-04-19 NOTE — Progress Notes (Addendum)
PROGRESS NOTE  Michaela Little OEV:[id number] DOB: 1960-05-22 DOA: 04/18/2018 PCP: Ladell Pier, MD  HPI/Recap of past 24 hours:   Michaela Little is a 58 y.o. 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  She went to her routine follow-up appointment today.  Lab studies were remarkable for significant hypercalcemia, profound hypokalemia, severe normocytic anemia and AKI.  Patient reports in the last week she has had generalized weakness and diffuse bone pain.  Denies any fever chills but admits to dysuria and polyuria.  04/19/18: seen and examined at bedside. Reports mild to moderate headaches improved w her pain medication. Alert reports fatigue and generalized weakness.   Assessment/Plan: Active Problems:   Hypercalcemia  Severe symptomatic hypercalcemia suspect secondary to her malignancy with bone metastasis Calcium >15 C/w  IV fluid hydration half-normal saline at 150 cc/h Received 1 dose IV biphosphonate Monitor urine output closely Repeat chemistry panel at 1200 Dr. Burr Medico, medical oncology will be consulted Nephrology, Dr Jonnie Finner, consulted  Hypokalemia, repleting Presented with potassium 2.8 Replete as indicated po kcl 40 meq x2 doses Magnesium level 2.0 Repeat BMP in the morning  Severe normocytic anemia Hemoglobin trending down 5.1 from 5.8, MCV 95, component of dilution Refuses blood transfusion due to religious reasons Last FOBT was negative in January 01, 2018 Obtain FOBT, pending collection If FOBT positive may consider holding off full dose Lovenox  Headaches, suspect related to her malignancy Improved with Tylenol If worsen may consider CT head  AKI Baseline creatinine 0.7 Presented with creatinine of 1.4 Cr tending up to 1.52 Continue  IV fluid hydration Monitor urine output closely.  Repeat chemistry panel in the morning Closely monitor electrolytes Nephrology consulted  Elevated Tbilirubin Tbili 1.4 from 1.7 C/w monitor  Thrombocytopenia, suspect related to chemotherapy plt 119k from 124k No sign of overt bleeding  Metastatic breast cancer status post left mastectomy Metastasis to bone and appendix Management per medical oncology Dr. Burr Medico will be consulted  History of familial DVT and recent PE On full dose Lovenox Also has an IVC filter in place  Generalized weakness/physical debility suspect secondary to her malignancy Fall precautions PT to assess Mobilize as tolerated  Dysuria/polyuria Obtain urine analysis and urine culture Treat as indicated  Chronic diastolic CHF Last 2D echo done on 03/02/2018 revealed LVEF 65 to 70% with grade 1 diastolic dysfunction Appears euvolemic C/w strict I's and O's and daily weight   Risks: High risk for decompensation due to severe dyselectrolytemia, severe normocytic anemia refusing blood transfusions, multiple comorbidities and advanced age.  Patient will require at least 2 midnights for further evaluation and treatment of present condition.    DVT prophylaxis: Full dose Lovenox  Code Status: Full code  Family Communication: Daughter at bedside.  All questions answered to their satisfaction.  Disposition Plan: Admit to telemetry unit  Consults called: We will consult medical oncology. Nephrology consulted.  Admission status: Inpatient status      Objective: Vitals:   04/19/18 0300 04/19/18 0418 04/19/18 0615 04/19/18 1003  BP:  (!) 175/67 (!) 169/74   Pulse:  76 82   Resp:  16    Temp:  98.6 F (37 C)    TempSrc:  Oral    SpO2:  100% 94% 99%  Weight: 103.3 kg     Height:  5\' 7"  (1.702 m)       Intake/Output Summary (Last 24 hours) at 04/19/2018 1257 Last data filed at 04/19/2018 0600 Gross per 24 hour  Intake 720 ml    Output -  Net 720 ml   Filed Weights   04/19/18 0300  Weight: 103.3 kg    Exam:  . General: 58 y.o. year-old female well developed well nourished in no acute distress.  Somnolent but easily arousable to voices.  Oriented x3. . Cardiovascular: Regular rate and rhythm with no rubs or gallops.  No thyromegaly or JVD noted.   Marland Kitchen Respiratory: Clear to auscultation with no wheezes or rales. Good inspiratory effort. . Abdomen: Soft nontender nondistended with normal bowel sounds x4 quadrants. . Musculoskeletal: No lower extremity edema. 2/4 pulses in all 4 extremities. Marland Kitchen Psychiatry: Mood is appropriate for condition and setting   Data Reviewed: CBC: Recent Labs  Lab 04/18/18 1423 04/18/18 1846 04/19/18 0546  WBC 3.8* 3.6* 3.5*  NEUTROABS 1.9  --   --   HGB 5.8* 5.2* 5.1*  HCT 19.9* 18.3* 17.9*  MCV 95.7 98.9 96.8  PLT 124* 119* 935*   Basic Metabolic Panel: Recent Labs  Lab 04/18/18 1423 04/18/18 1846 04/18/18 1849 04/19/18 0546  NA 142  --  143 143  K 2.6*  --  2.7* 3.1*  CL 100  --  102 105  CO2 31  --  31 29  GLUCOSE 122*  --  112* 116*  BUN 11  --  12 13  CREATININE 1.40*  --  1.31* 1.52*  CALCIUM 15.5*  --  14.4* >15.0*  MG  --  2.0  --   --   PHOS  --  4.2  --   --    GFR: Estimated Creatinine Clearance: 48.7 mL/min (A) (by C-G formula based on SCr of 1.52 mg/dL (H)). Liver Function Tests: Recent Labs  Lab 04/18/18 1423 04/18/18 1849 04/19/18 0546  AST 24 29 30   ALT 9 13 12   ALKPHOS 116 93 90  BILITOT 1.5* 1.7* 1.4*  PROT 7.6 7.7 6.9  ALBUMIN 3.5 3.9 3.5   No results for input(s): LIPASE, AMYLASE in the last 168 hours. No results for input(s): AMMONIA in the last 168 hours. Coagulation Profile: No results for input(s): INR, PROTIME in the last 168 hours. Cardiac Enzymes: No results for input(s): CKTOTAL, CKMB, CKMBINDEX, TROPONINI in the last 168 hours. BNP (last 3 results) No results for input(s): PROBNP in the last 8760 hours. HbA1C: Recent  Labs    04/18/18 1846  HGBA1C 5.0   CBG: No results for input(s): GLUCAP in the last 168 hours. Lipid Profile: No results for input(s): CHOL, HDL, LDLCALC, TRIG, CHOLHDL, LDLDIRECT in the last 72 hours. Thyroid Function Tests: No results for input(s): TSH, T4TOTAL, FREET4, T3FREE, THYROIDAB in the last 72 hours. Anemia Panel: No results for input(s): VITAMINB12, FOLATE, FERRITIN, TIBC, IRON, RETICCTPCT in the last 72 hours. Urine analysis:    Component Value Date/Time   COLORURINE YELLOW 04/02/2018 1243   APPEARANCEUR HAZY (A) 04/02/2018 1243   LABSPEC 1.012 04/02/2018 1243   LABSPEC 1.025 01/11/2016 1233   PHURINE 6.0 04/02/2018 1243   GLUCOSEU NEGATIVE 04/02/2018 1243   GLUCOSEU Negative 01/11/2016 1233   HGBUR NEGATIVE 04/02/2018 1243   BILIRUBINUR NEGATIVE 04/02/2018 1243   BILIRUBINUR small 12/03/2016 1117   BILIRUBINUR Negative 01/11/2016 [place] 04/02/2018 1243   PROTEINUR NEGATIVE 04/02/2018 1243   UROBILINOGEN 1.0 12/03/2016 1117   UROBILINOGEN 1.0  09/13/2016 1306   UROBILINOGEN 0.2 01/11/2016 1233   NITRITE NEGATIVE 04/02/2018 1243   LEUKOCYTESUR SMALL (A) 04/02/2018 1243   LEUKOCYTESUR Small 01/11/2016 1233   Sepsis Labs: @LABRCNTIP (procalcitonin:4,lacticidven:4)  )No results found for this or any previous visit (from the past 240 hour(s)).    Studies: Dg Chest Port 1 View  Result Date: 04/18/2018 CLINICAL DATA:  Productive cough and shortness of breath. History of breast cancer and hypertension. EXAM: PORTABLE CHEST 1 VIEW COMPARISON:  03/08/2018 and chest CTA dated 04/02/2018. FINDINGS: Stable mildly enlarged cardiac silhouette. Clear lungs with normal vascularity. Mild patchy bone sclerosis and multiple small lucent lesions in both shoulders. This was diffusely involving the regional skeleton on the recent CTA. Left axillary surgical clips. IMPRESSION: No acute abnormality. Stable mild cardiomegaly and diffuse bony metastatic disease.  Electronically Signed   By: Claudie Revering M.D.   On: 04/18/2018 19:10    Scheduled Meds: . amLODipine  5 mg Oral Daily  . enoxaparin  100 mg Subcutaneous Q12H  . fluticasone furoate-vilanterol  1 puff Inhalation Daily  . folic acid  1 mg Oral Daily  . furosemide  40 mg Intravenous Once  . loratadine  10 mg Oral Daily  . senna-docusate  2 tablet Oral BID  . cyanocobalamin  1,000 mcg Oral Daily    Continuous Infusions: . sodium chloride 150 mL/hr at 04/19/18 0740     LOS: 1 day     Kayleen Memos, MD [facility] Pager [phone]  If 7PM-7AM, please contact night-coverage [email or web address] Password [facility] 04/19/2018, 12:57 PM

## 2018-04-19 NOTE — Progress Notes (Signed)
CRITICAL VALUE ALERT  Critical Value: Hemoglobin 5.1  Date & Time Notied:04/18/18 ; 0625  Provider Notified: yes  Orders Received/Actions taken: Awaiting new orders

## 2018-04-19 NOTE — Progress Notes (Signed)
CRITICAL VALUE ALERT  Critical Value:  Calcium >15  Date & Time [id number]  Provider Notified: yes  Orders Received/Actions taken: Awaiting new orders

## 2018-04-20 LAB — COMPREHENSIVE METABOLIC PANEL
ALT: 13 U/L (ref 0–44)
AST: 31 U/L (ref 15–41)
Albumin: 3.5 g/dL (ref 3.5–5.0)
Alkaline Phosphatase: 84 U/L (ref 38–126)
Anion gap: 9 (ref 5–15)
BUN: 17 mg/dL (ref 6–20)
CHLORIDE: 102 mmol/L (ref 98–111)
CO2: 27 mmol/L (ref 22–32)
Calcium: 11.3 mg/dL — ABNORMAL HIGH (ref 8.9–10.3)
Creatinine, Ser: 1.65 mg/dL — ABNORMAL HIGH (ref 0.44–1.00)
GFR calc Af Amer: 39 mL/min — ABNORMAL LOW (ref >60)
GFR calc non Af Amer: 33 mL/min — ABNORMAL LOW (ref >60)
Glucose, Bld: 123 mg/dL — ABNORMAL HIGH (ref 70–99)
POTASSIUM: 3.2 mmol/L — AB (ref 3.5–5.1)
Sodium: 138 mmol/L (ref 135–145)
Total Bilirubin: 1.3 mg/dL — ABNORMAL HIGH (ref 0.3–1.2)
Total Protein: 7.1 g/dL (ref 6.5–8.1)

## 2018-04-20 LAB — CBC
HCT: 18 % — ABNORMAL LOW (ref 36.0–46.0)
Hemoglobin: 5.1 g/dL — CL (ref 12.0–15.0)
MCH: 27.9 pg (ref 26.0–34.0)
MCHC: 28.3 g/dL — ABNORMAL LOW (ref 30.0–36.0)
MCV: 98.4 fL (ref 80.0–100.0)
Platelets: 98 10*3/uL — ABNORMAL LOW (ref 150–400)
RBC: 1.83 MIL/uL — ABNORMAL LOW (ref 3.87–5.11)
RDW: 19.7 % — ABNORMAL HIGH (ref 11.5–15.5)
WBC: 3.1 10*3/uL — ABNORMAL LOW (ref 4.0–10.5)
nRBC: 4.9 % — ABNORMAL HIGH (ref 0.0–0.2)

## 2018-04-20 MED ORDER — OXYCODONE HCL 5 MG PO TABS: 5.0000 mg | ORAL_TABLET | ORAL | Status: DC | PRN

## 2018-04-20 MED ORDER — ORAL CARE MOUTH RINSE: 15.0000 mL | Freq: Two times a day (BID) | OROMUCOSAL | Status: DC

## 2018-04-20 MED ORDER — POTASSIUM CHLORIDE 10 MEQ/100ML IV SOLN
10.0000 meq | INTRAVENOUS | Status: AC
  Administered 2018-04-20 (×4): 10 meq via INTRAVENOUS
  Filled 2018-04-20 (×4): qty 100

## 2018-04-20 MED ORDER — ENSURE ENLIVE PO LIQD
237.0000 mL | Freq: Two times a day (BID) | ORAL | Status: DC
  Administered 2018-04-20 – 2018-04-22 (×4): 237 mL via ORAL

## 2018-04-20 MED ORDER — ACETAMINOPHEN 325 MG PO TABS: 650.0000 mg | ORAL_TABLET | Freq: Four times a day (QID) | ORAL | Status: DC | PRN

## 2018-04-20 MED ORDER — HYDRALAZINE HCL 10 MG PO TABS
10.0000 mg | ORAL_TABLET | Freq: Three times a day (TID) | ORAL | Status: DC
  Administered 2018-04-20 – 2018-04-22 (×8): 10 mg via ORAL
  Filled 2018-04-20 (×8): qty 1

## 2018-04-20 MED ORDER — POTASSIUM CHLORIDE 10 MEQ/100ML IV SOLN
INTRAVENOUS | Status: AC
  Filled 2018-04-20: qty 100

## 2018-04-20 MED ORDER — LIP MEDEX EX OINT
TOPICAL_OINTMENT | CUTANEOUS | Status: AC
  Administered 2018-04-20: 08:00:00
  Filled 2018-04-20: qty 7

## 2018-04-20 MED ORDER — TRAMADOL HCL 50 MG PO TABS
50.0000 mg | ORAL_TABLET | Freq: Four times a day (QID) | ORAL | Status: DC | PRN
  Administered 2018-04-20 – 2018-04-21 (×2): 50 mg via ORAL
  Filled 2018-04-20 (×2): qty 1

## 2018-04-20 MED ORDER — ORAL CARE MOUTH RINSE
15.0000 mL | Freq: Two times a day (BID) | OROMUCOSAL | Status: DC
  Administered 2018-04-20: 15 mL via OROMUCOSAL

## 2018-04-20 NOTE — Progress Notes (Signed)
Paged by bedside RN regarding pts lethargy and tachycardia. MEWS also red at this time. At bedside to see pt and temp noted to be 102.1 with HR between 120's-130's. Pt slightly tachypneic as well. Pt drowsy but arouses easily to light stimuli and is A&Ox4. No c/o chest pain, SOB, or pain but does state that she does not feel well. Ordered 1gm of Tylenol for fever and 5mg  of Metoprolol IV for her tachycardia. Last Hgb 5.1 and pt is Jehovah's Witness and has been refusing blood products. Bedside RN and charge RN expressed concerns regarding pts level of care and pt will be transferred to stepdown for closer monitoring.  Arby Barrette AGPCNP-BC, AGNP-C [facility] Pager [phone]

## 2018-04-20 NOTE — Progress Notes (Signed)
Upon assessment pt states she, "did not fell well". Pt appeared lethargic laying on her side with her eyes closed.This RN received report from off going RN that pt became diaphoretic with nausea and vomiting before shift change. New set of vitals obtained and found to be as followed, Temp 102.3, RR 26, HR sustaining in the 120s. NP on call notified. Tylenol and 5mg  IV metoprolol given. Transfer order placed for ICU/SD. Report called to receiving RN. Writer then transported pt to receiving unit.

## 2018-04-20 NOTE — Progress Notes (Signed)
[facility] Progress Note  Subjective: 2100 cc UOP yest, 4.8 L in. Creat up slightly to 1.6. Ca down to 11.3. CO2 27  UBN 17.  AG 9  CO2 27. Pt uncomfortable, having pain "all over", no specific sites.   Vitals:   04/20/18 0600 04/20/18 0800 04/20/18 1056 04/20/18 1200  BP: (!) 182/56 (!) 176/61    Pulse: 86 96    Resp: 17 20    Temp:  98.8 F (37.1 C)  98.2 F (36.8 C)  TempSrc:  Oral  Oral  SpO2: 100% 100% 100%   Weight:      Height:        Inpatient medications: . amLODipine  10 mg Oral Daily  . calcitonin  400 Units Intramuscular BID  . enoxaparin  100 mg Subcutaneous Q12H  . feeding supplement (ENSURE ENLIVE)  237 mL Oral BID BM  . fluticasone furoate-vilanterol  1 puff Inhalation Daily  . folic acid  1 mg Oral Daily  . hydrALAZINE  10 mg Oral Q8H  . loratadine  10 mg Oral Daily  . mouth rinse  15 mL Mouth Rinse BID  . senna-docusate  2 tablet Oral BID  . cyanocobalamin  1,000 mcg Oral Daily   . sodium chloride 175 mL/hr at 04/20/18 1045   acetaminophen, hydrALAZINE, HYDROmorphone (DILAUDID) injection, ipratropium-albuterol, ondansetron (ZOFRAN) IV, traMADol  Iron/TIBC/Ferritin/ %Sat    Component Value Date/Time   IRON 182 (H) 01/01/2018 1838   TIBC 318 01/01/2018 1838   FERRITIN 935 (H) 01/01/2018 1838   IRONPCTSAT 57 (H) 01/01/2018 1838    Exam: Gen eyes closed, looks very tired, uncomfortable, not in distress, calm No jvd or bruits, flat neck veins Chest clear bilat to bases RRR no MRG, tachy w/ forceful PMI Abd soft ntnd no mass or ascites +bs Ext no LE or UE edema, no wounds or ulcers Neuro is alert, Ox 3 , nf    Home meds:  - fluticasone furoate-vilanterol 1 puff qd/ ipratropium-albuterol 3cc qid prn  - enoxaparin 110mg  bid sq  - vitamins/ prns    UA 1/24 negative  CXR 1/24 clear , +bony metastatic changes  baseline creat 0.72 in Jan 2020  Assessment/ Plan: 1. Hypercalcemia of malignancy (metastatic breast Ca)- sp  Zoledronic acid x 1 and getting IV calcitonin and IVF's. Ca++ down 11.3 this am.  Pt not symptomatic. Having lots of pain, suspect it's bone pain related to mets. Would lower IVF's to 100 /hr today and wean down or off over the next 24-48 hrs. No other suggestions. Will sign off.  2. AKI - mild, creat 1.6 (baseline 0.8 early this year) due to hyperCa++  3. Metastatic breast Cancer - dx'd in 2010, metastatic to bones in 2017 4. Anemia severe - from bone mets, declines PRBC's due to religious belief 5. Leukopenia / Thrombocytopenia. - mild 6. Vol - stable on exam       Kelly Splinter MD [facility] pager [phone]   04/20/2018, 2:39 PM   Recent Labs  Lab 04/18/18 1846  04/19/18 1237 04/20/18 0322  NA  --    < > 144 138  K  --    < > 3.0* 3.2*  CL  --    < > 107 102  CO2  --    < > 29 27  GLUCOSE  --    < > 137* 123*  BUN  --    < > 14 17  CREATININE  --    < >  1.54* 1.65*  CALCIUM  --    < > 14.5* 11.3*  PHOS 4.2  --   --   --   ALBUMIN  --    < > 3.8 3.5   < > = values in this interval not displayed.   Recent Labs  Lab 04/19/18 1237 04/20/18 0322  AST 32 31  ALT 14 13  ALKPHOS 94 84  BILITOT 1.2 1.3*  PROT 7.3 7.1   Recent Labs  Lab 04/18/18 1423  04/19/18 0546 04/20/18 0322  WBC 3.8*   < > 3.5* 3.1*  NEUTROABS 1.9  --   --   --   HGB 5.8*   < > 5.1* 5.1*  HCT 19.9*   < > 17.9* 18.0*  MCV 95.7   < > 96.8 98.4  PLT 124*   < > 126* 98*   < > = values in this interval not displayed.

## 2018-04-20 NOTE — Progress Notes (Signed)
PT Cancellation Note  Patient Details Name: Michaela Little MRN: [id number] DOB: 1960/11/03   Cancelled Treatment:    Reason Eval/Treat Not Completed: Medical issues which prohibited therapy Pt transferred to SDU this morning.  Will check back as schedule permits.   Kassey Laforest,KATHrine E 04/20/2018, 8:42 AM Carmelia Bake, PT, DPT Acute Rehabilitation Services Office: [phone] Pager: [phone]

## 2018-04-20 NOTE — Progress Notes (Signed)
Initial Nutrition Assessment  DOCUMENTATION CODES:   Obesity unspecified  INTERVENTION:   Provide Ensure Enlive po BID, each supplement provides 350 kcal and 20 grams of protein  NUTRITION DIAGNOSIS:   Inadequate oral intake related to poor appetite as evidenced by per patient/family report.  GOAL:   Patient will meet greater than or equal to 90% of their needs  MONITOR:   PO intake, Supplement acceptance, Labs, Weight trends, I & O's  REASON FOR ASSESSMENT:   Malnutrition Screening Tool    ASSESSMENT:    58 y.o. 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  Patient eating 10-100% of meals. Pt was having episodes of vomiting. Reports no other episodes this morning and feeling better. Pt states appetite changed following her appendectomy in May 2019. Pt started to lose weight after this surgery. RD to order Ensure supplements now that vomiting has subsided.  Per weight records ,pt has lost 18 lb since 01/18/18 (7% wt loss x 3 months, significant for time frame).   Medications: folic acid tablet daily, Senokot-S tablet BID, Vitamin B-12 tablet daily Labs reviewed: Low K GFR: 39   NUTRITION - FOCUSED PHYSICAL EXAM:  Nutrition focused physical exam shows no sign of depletion of muscle mass or body fat.  Diet Order:   Diet Order            Diet regular Room service appropriate? Yes; Fluid consistency: Thin  Diet effective now              EDUCATION NEEDS:   No education needs have been identified at this time  Skin:  Skin Assessment: Reviewed RN Assessment  Last BM:  1/25  Height:   Ht Readings from Last 1 Encounters:  04/19/18 5\' 7"  (1.702 m)    Weight:   Wt Readings from Last 1 Encounters:  04/19/18 103.3 kg    Ideal Body Weight:   61.3 kg  BMI:  Body mass index is 35.68 kg/m.  Estimated Nutritional Needs:   Kcal:  1800-2000  Protein:  70-80g  Fluid:  2L/day  Clayton Bibles, MS, RD, LDN [facility] Pager: [phone] After Hours Pager: [phone]

## 2018-04-20 NOTE — Progress Notes (Signed)
PROGRESS NOTE  Michaela Little ZOX:[id number] DOB: 1960-11-24 DOA: 04/18/2018 PCP: Ladell Pier, MD  HPI/Recap of past 24 hours:   Michaela Little is a 58 y.o. 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  She went to her routine follow-up appointment today.  Lab studies were remarkable for significant hypercalcemia, profound hypokalemia, severe normocytic anemia and AKI.  Patient reports in the last week she has had generalized weakness and diffuse bone pain.  Denies any fever chills but admits to dysuria and polyuria.  04/19/18: seen and examined at bedside. Reports mild to moderate headaches improved w her pain medication. Alert reports fatigue and generalized weakness.  04/20/18: Overnight patient had to be transferred to ICU for close monitoring due to acute change, fever with T-max of 102.3, not feeling well, lethargic, diaphoretic and vomiting.  This morning she states she feels better.  Calcium level trending down.   Assessment/Plan: Active Problems:   Hypercalcemia  Severe symptomatic hypercalcemia  of malignancy  Calcium 11 from >15 C/w  IV fluid hydration half-normal saline at 200 cc/h Received 1 dose IV biphosphonate Continue calcitonin x4 doses Continue to monitor urine output Dr. Burr Medico, medical oncology will see on Monday, 04/21/2018 Nephrology following.  Highly appreciated.  AKI, suspect prerenal in the setting of hypercalcemia Baseline creatinine is normal Worsening creatinine 1.65 Continue IV fluids Continue to monitor urine output Urine output 1400 cc in the last 24 hours Repeat BMP in the morning  Hypokalemia, repleting Presented with potassium 2.8 Replete as indicated po kcl 40 meq x2 doses Magnesium level 2.0 Continue to replete as  indicated Repeat BMP in the morning  Uncontrolled hypertension Continue amlodipine 10 mg daily Add p.o. hydralazine 10 mg 3 times daily Continue to closely monitor vital signs  Severe normocytic anemia Hemoglobin holding at 5.1 Plebotomy team instructed to use pediatric tubes Declines blood transfusion due to religious belief; [name] Last FOBT was negative in January 01, 2018 Iron studies unremarkable  Intermittent headaches, suspect related to her malignancy Improved with Tylenol  Elevated Tbilirubin Tbili 1.4 from 1.7 C/w monitor  Thrombocytopenia, suspect related to chemotherapy plt 98K from 119k from 124k No sign of overt bleeding  Metastatic breast cancer status post left mastectomy Metastasis to bone and appendix Management per medical oncology Dr. Burr Medico will see on Monday, 04/21/2018  History of familial DVT and recent PE Continue full dose Lovenox Also has an IVC filter in place  Generalized weakness/fatigue/physical debility suspect multifactorial secondary to hypercalcemia, her malignancy Fall precautions PT to assess Mobilize as tolerated Oral supplement as tolerated  Dysuria/polyuria Negative urine analysis  Chronic diastolic CHF Last 2D echo done on 03/02/2018 revealed LVEF 65 to 70% with grade 1 diastolic dysfunction Appears euvolemic C/w strict I's and O's and daily weight Monitor for any acute changes    DVT prophylaxis: Full dose Lovenox twice daily  Code Status: Full code  Family Communication: Daughter at bedside.  All questions answered to their satisfaction.  Disposition Plan:  DC to home with home health PT once medical oncology and nephrology sign off.  Consults called:  Medical oncology, nephrology       Objective: Vitals:   04/20/18 0600 04/20/18 0800 04/20/18 1056 04/20/18 1200  BP: (!) 182/56 (!) 176/61    Pulse: 86 96  Resp: 17 20    Temp:  98.8 F (37.1 C)  98.2 F (36.8 C)  TempSrc:  Oral   Oral  SpO2: 100% 100% 100%   Weight:      Height:        Intake/Output Summary (Last 24 hours) at 04/20/2018 1258 Last data filed at 04/20/2018 0600 Gross per 24 hour  Intake 4412.61 ml  Output 2100 ml  Net 2312.61 ml   Filed Weights   04/19/18 0300  Weight: 103.3 kg    Exam:  . General: 58 y.o. year-old female well-developed well-nourished no acute distress.  Alert and oriented x3.  . Cardiovascular: Regular rate and rhythm with no rubs or gallops.  No JVD or thyromegaly noted.  Respiratory: Clear to auscultation with no wheezes or rales.  Poor inspiratory effort.   . Abdomen: Soft nontender nondistended with normal bowel sounds x4 quadrants. . Musculoskeletal: No lower extremity edema. 2/4 pulses in all 4 extremities. Marland Kitchen Psychiatry: Mood is appropriate for condition and setting   Data Reviewed: CBC: Recent Labs  Lab 04/18/18 1423 04/18/18 1846 04/19/18 0546 04/20/18 0322  WBC 3.8* 3.6* 3.5* 3.1*  NEUTROABS 1.9  --   --   --   HGB 5.8* 5.2* 5.1* 5.1*  HCT 19.9* 18.3* 17.9* 18.0*  MCV 95.7 98.9 96.8 98.4  PLT 124* 119* 126* 98*   Basic Metabolic Panel: Recent Labs  Lab 04/18/18 1423 04/18/18 1846 04/18/18 1849 04/19/18 0546 04/19/18 1237 04/20/18 0322  NA 142  --  143 143 144 138  K 2.6*  --  2.7* 3.1* 3.0* 3.2*  CL 100  --  102 105 107 102  CO2 31  --  31 29 29 27   GLUCOSE 122*  --  112* 116* 137* 123*  BUN 11  --  12 13 14 17   CREATININE 1.40*  --  1.31* 1.52* 1.54* 1.65*  CALCIUM 15.5*  --  14.4* >15.0* 14.5* 11.3*  MG  --  2.0  --   --   --   --   PHOS  --  4.2  --   --   --   --    GFR: Estimated Creatinine Clearance: 44.8 mL/min (A) (by C-G formula based on SCr of 1.65 mg/dL (H)). Liver Function Tests: Recent Labs  Lab 04/18/18 1423 04/18/18 1849 04/19/18 0546 04/19/18 1237 04/20/18 0322  AST 24 29 30  32 31  ALT 9 13 12 14 13   ALKPHOS 116 93 90 94 84  BILITOT 1.5* 1.7* 1.4* 1.2 1.3*  PROT 7.6 7.7 6.9 7.3 7.1  ALBUMIN 3.5 3.9 3.5 3.8 3.5    No results for input(s): LIPASE, AMYLASE in the last 168 hours. No results for input(s): AMMONIA in the last 168 hours. Coagulation Profile: No results for input(s): INR, PROTIME in the last 168 hours. Cardiac Enzymes: No results for input(s): CKTOTAL, CKMB, CKMBINDEX, TROPONINI in the last 168 hours. BNP (last 3 results) No results for input(s): PROBNP in the last 8760 hours. HbA1C: Recent Labs    04/18/18 1846  HGBA1C 5.0   CBG: Recent Labs  Lab 04/19/18 1351  GLUCAP 119*   Lipid Profile: No results for input(s): CHOL, HDL, LDLCALC, TRIG, CHOLHDL, LDLDIRECT in the last 72 hours. Thyroid Function Tests: No results for input(s): TSH, T4TOTAL, FREET4, T3FREE, THYROIDAB in the last 72 hours. Anemia Panel: No results for input(s): VITAMINB12, FOLATE, FERRITIN, TIBC, IRON, RETICCTPCT in the last 72 hours. Urine analysis:    Component Value Date/Time  COLORURINE COLORLESS (A) 04/18/2018 1759   APPEARANCEUR CLEAR 04/18/2018 1759   LABSPEC 1.003 (L) 04/18/2018 1759   LABSPEC 1.025 01/11/2016 1233   PHURINE 8.0 04/18/2018 1759   GLUCOSEU NEGATIVE 04/18/2018 1759   GLUCOSEU Negative 01/11/2016 1233   HGBUR SMALL (A) 04/18/2018 1759   BILIRUBINUR NEGATIVE 04/18/2018 1759   BILIRUBINUR small 12/03/2016 1117   BILIRUBINUR Negative 01/11/2016 [place] 04/18/2018 1759   PROTEINUR NEGATIVE 04/18/2018 1759   UROBILINOGEN 1.0 12/03/2016 1117   UROBILINOGEN 1.0 09/13/2016 1306   UROBILINOGEN 0.2 01/11/2016 1233   NITRITE NEGATIVE 04/18/2018 1759   LEUKOCYTESUR NEGATIVE 04/18/2018 1759   LEUKOCYTESUR Small 01/11/2016 1233   Sepsis Labs: @LABRCNTIP (procalcitonin:4,lacticidven:4)  )No results found for this or any previous visit (from the past 240 hour(s)).    Studies: No results found.  Scheduled Meds: . amLODipine  10 mg Oral Daily  . calcitonin  400 Units Intramuscular BID  . enoxaparin  100 mg Subcutaneous Q12H  . fluticasone furoate-vilanterol  1  puff Inhalation Daily  . folic acid  1 mg Oral Daily  . hydrALAZINE  10 mg Oral Q8H  . loratadine  10 mg Oral Daily  . mouth rinse  15 mL Mouth Rinse BID  . senna-docusate  2 tablet Oral BID  . cyanocobalamin  1,000 mcg Oral Daily    Continuous Infusions: . sodium chloride 175 mL/hr at 04/20/18 1045     LOS: 2 days     Kayleen Memos, MD [facility] Pager [phone]  If 7PM-7AM, please contact night-coverage [email or web address] Password [facility] 04/20/2018, 12:58 PM

## 2018-04-21 ENCOUNTER — Telehealth: Payer: Self-pay | Admitting: Hematology

## 2018-04-21 DIAGNOSIS — Z86718 Personal history of other venous thrombosis and embolism: Secondary | ICD-10-CM

## 2018-04-21 DIAGNOSIS — Z86711 Personal history of pulmonary embolism: Secondary | ICD-10-CM

## 2018-04-21 DIAGNOSIS — C50919 Malignant neoplasm of unspecified site of unspecified female breast: Secondary | ICD-10-CM

## 2018-04-21 DIAGNOSIS — D630 Anemia in neoplastic disease: Secondary | ICD-10-CM

## 2018-04-21 DIAGNOSIS — I5030 Unspecified diastolic (congestive) heart failure: Secondary | ICD-10-CM

## 2018-04-21 DIAGNOSIS — C7951 Secondary malignant neoplasm of bone: Secondary | ICD-10-CM

## 2018-04-21 DIAGNOSIS — N179 Acute kidney failure, unspecified: Secondary | ICD-10-CM

## 2018-04-21 DIAGNOSIS — E876 Hypokalemia: Secondary | ICD-10-CM

## 2018-04-21 LAB — COMPREHENSIVE METABOLIC PANEL
ALT: 13 U/L (ref 0–44)
AST: 38 U/L (ref 15–41)
Albumin: 3.2 g/dL — ABNORMAL LOW (ref 3.5–5.0)
Alkaline Phosphatase: 74 U/L (ref 38–126)
Anion gap: 8 (ref 5–15)
BILIRUBIN TOTAL: 1.1 mg/dL (ref 0.3–1.2)
BUN: 16 mg/dL (ref 6–20)
CO2: 22 mmol/L (ref 22–32)
CREATININE: 1.17 mg/dL — AB (ref 0.44–1.00)
Calcium: 9 mg/dL (ref 8.9–10.3)
Chloride: 103 mmol/L (ref 98–111)
GFR calc Af Amer: 59 mL/min — ABNORMAL LOW (ref >60)
GFR calc non Af Amer: 51 mL/min — ABNORMAL LOW (ref >60)
Glucose, Bld: 127 mg/dL — ABNORMAL HIGH (ref 70–99)
Potassium: 3.5 mmol/L (ref 3.5–5.1)
Sodium: 133 mmol/L — ABNORMAL LOW (ref 135–145)
Total Protein: 6.6 g/dL (ref 6.5–8.1)

## 2018-04-21 LAB — URINE CULTURE

## 2018-04-21 MED ORDER — BISACODYL 10 MG RE SUPP
10.0000 mg | Freq: Once | RECTAL | Status: AC
  Administered 2018-04-22: 10 mg via RECTAL
  Filled 2018-04-21: qty 1

## 2018-04-21 MED ORDER — POLYETHYLENE GLYCOL 3350 17 G PO PACK
17.0000 g | PACK | Freq: Every day | ORAL | Status: DC
  Filled 2018-04-21: qty 1

## 2018-04-21 MED ORDER — FUROSEMIDE 10 MG/ML IJ SOLN
20.0000 mg | Freq: Once | INTRAMUSCULAR | Status: AC
  Administered 2018-04-21: 20 mg via INTRAVENOUS
  Filled 2018-04-21: qty 2

## 2018-04-21 MED ORDER — PHENOL 1.4 % MT LIQD
1.0000 | OROMUCOSAL | Status: DC | PRN
  Administered 2018-04-21: 1 via OROMUCOSAL
  Filled 2018-04-21: qty 177

## 2018-04-21 NOTE — Progress Notes (Signed)
PROGRESS NOTE  Michaela Little HQP:[id number] DOB: 09/30/1960 DOA: 04/18/2018 PCP: Ladell Pier, MD  HPI/Recap of past 24 hours:   Michaela Little is a 58 y.o. 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  She went to her routine follow-up appointment today.  Lab studies were remarkable for significant hypercalcemia, profound hypokalemia, severe normocytic anemia and AKI.  Patient reports in the last week she has had generalized weakness and diffuse bone pain.  Denies any fever chills but admits to dysuria and polyuria.  04/19/18: Fatigue and generalized weakness. 04/20/18: Fever with T-max of 102.3, not feeling well, lethargic, diaphoretic and vomiting.  This morning she states she feels better.  Calcium level trending down.  04/21/2018: Patient seen and examined at bedside.  States she feels better.  No acute events overnight.  No new complaints.  Her calcium level continues to trend down.   Assessment/Plan: Active Problems:   Hypercalcemia  Severe symptomatic hypercalcemia  of malignancy  Calcium 9.0 from 11 from >15 DC IV fluid due to concern for third spacing Give 1 dose of IV Lasix 20 mg Received 1 dose IV biphosphonate Received 4 doses of calcitonin  Continue to monitor urine output Dr. Burr Medico, medical oncology will see on Monday, 04/21/2018 Nephrology followed and signed off on 04/20/2018  Improving AKI, suspect prerenal in the setting of hypercalcemia Baseline creatinine is normal Creatinine improving 1.1 from 1.65 Discontinue IV fluid on 04/21/2018 Repeat BMP in the morning  Resolved hypokalemia post repletion   Hypervolemic hyponatremia Suspect iatrogenic secondary to fluid overload Sodium 133 Give 1 dose of IV Lasix 20 mg  Resolved uncontrolled  hypertension Continue amlodipine 10 mg daily Continue p.o. hydralazine 10 mg 3 times daily Continue to closely monitor vital signs  Severe normocytic anemia Hemoglobin holding at 5.1 Plebotomy team instructed to use pediatric tubes Declines blood transfusion due to religious belief; Michaela Little witness Last FOBT was negative in January 01, 2018 Iron studies unremarkable  Intermittent headaches, suspect related to her malignancy Improved with Tylenol  Elevated Tbilirubin Tbili 1.4 from 1.7 C/w monitor  Thrombocytopenia, suspect related to chemotherapy plt 98K on 04/20/2018 from 119k from 124k No sign of overt bleeding Repeat CBC on 04/22/2018  Metastatic breast cancer status post left mastectomy Metastasis to bone and appendix Management per medical oncology Dr. Burr Medico will see on Monday, 04/21/2018  History of familial DVT and recent PE Continue full dose Lovenox Also has an IVC filter in place  Generalized weakness/fatigue/physical debility suspect multifactorial secondary to hypercalcemia, her malignancy Fall precautions PT to assess Mobilize as tolerated Oral supplement as tolerated  Dysuria/polyuria Negative urine analysis  Chronic diastolic CHF Last 2D echo done on 03/02/2018 revealed LVEF 65 to 70% with grade 1 diastolic dysfunction Appears euvolemic C/w strict I's and O's and daily weight Continue to monitor for any acute changes    DVT prophylaxis: Full dose Lovenox twice daily  Code Status: Full code  Family Communication: None at bedside  Disposition Plan:  DC to home with home health PT once medical oncology and nephrology sign off.  Consults called:  Medical oncology, nephrology       Objective: Vitals:   04/21/18 0700 04/21/18 0800 04/21/18 0843 04/21/18 1000  BP: 131/70     Pulse: 84   88  Resp: 11  16  Temp:  98.3 F (36.8 C)    TempSrc:  Oral    SpO2: 100%  100% 100%  Weight:      Height:        Intake/Output  Summary (Last 24 hours) at 04/21/2018 1408 Last data filed at 04/21/2018 1226 Gross per 24 hour  Intake 5593.33 ml  Output 2650 ml  Net 2943.33 ml   Filed Weights   04/20/18 0812 04/21/18 0310 04/21/18 0312  Weight: 104 kg 106.2 kg 106.2 kg    Exam:  . General: 58 y.o. year-old female well-developed well-nourished in no acute distress.  Alert and oriented x3. . Cardiovascular: Regular rate and rhythm with no rubs or gallops.  No JVD or thyromegaly noted.  Respiratory: Mild rales at bases with no wheezes.  Good inspiratory effort. . Abdomen: Soft nontender nondistended with normal bowel sounds x4 quadrants. . Musculoskeletal: Trace lower extremity edema. 2/4 pulses in all 4 extremities. Marland Kitchen Psychiatry: Mood is appropriate for condition and setting   Data Reviewed: CBC: Recent Labs  Lab 04/18/18 1423 04/18/18 1846 04/19/18 0546 04/20/18 0322  WBC 3.8* 3.6* 3.5* 3.1*  NEUTROABS 1.9  --   --   --   HGB 5.8* 5.2* 5.1* 5.1*  HCT 19.9* 18.3* 17.9* 18.0*  MCV 95.7 98.9 96.8 98.4  PLT 124* 119* 126* 98*   Basic Metabolic Panel: Recent Labs  Lab 04/18/18 1846 04/18/18 1849 04/19/18 0546 04/19/18 1237 04/20/18 0322 04/21/18 0310  NA  --  143 143 144 138 133*  K  --  2.7* 3.1* 3.0* 3.2* 3.5  CL  --  102 105 107 102 103  CO2  --  31 29 29 27 22   GLUCOSE  --  112* 116* 137* 123* 127*  BUN  --  12 13 14 17 16   CREATININE  --  1.31* 1.52* 1.54* 1.65* 1.17*  CALCIUM  --  14.4* >15.0* 14.5* 11.3* 9.0  MG 2.0  --   --   --   --   --   PHOS 4.2  --   --   --   --   --    GFR: Estimated Creatinine Clearance: 64.1 mL/min (A) (by C-G formula based on SCr of 1.17 mg/dL (H)). Liver Function Tests: Recent Labs  Lab 04/18/18 1849 04/19/18 0546 04/19/18 1237 04/20/18 0322 04/21/18 0310  AST 29 30 32 31 38  ALT 13 12 14 13 13   ALKPHOS 93 90 94 84 74  BILITOT 1.7* 1.4* 1.2 1.3* 1.1  PROT 7.7 6.9 7.3 7.1 6.6  ALBUMIN 3.9 3.5 3.8 3.5 3.2*   No results for input(s): LIPASE,  AMYLASE in the last 168 hours. No results for input(s): AMMONIA in the last 168 hours. Coagulation Profile: No results for input(s): INR, PROTIME in the last 168 hours. Cardiac Enzymes: No results for input(s): CKTOTAL, CKMB, CKMBINDEX, TROPONINI in the last 168 hours. BNP (last 3 results) No results for input(s): PROBNP in the last 8760 hours. HbA1C: Recent Labs    04/18/18 1846  HGBA1C 5.0   CBG: Recent Labs  Lab 04/19/18 1351  GLUCAP 119*   Lipid Profile: No results for input(s): CHOL, HDL, LDLCALC, TRIG, CHOLHDL, LDLDIRECT in the last 72 hours. Thyroid Function Tests: No results for input(s): TSH, T4TOTAL, FREET4, T3FREE, THYROIDAB in the last 72 hours. Anemia Panel: No results for input(s): VITAMINB12, FOLATE, FERRITIN, TIBC, IRON, RETICCTPCT in the last 72 hours. Urine analysis:    Component Value Date/Time   COLORURINE COLORLESS (  A) 04/18/2018 1759   APPEARANCEUR CLEAR 04/18/2018 1759   LABSPEC 1.003 (L) 04/18/2018 1759   LABSPEC 1.025 01/11/2016 1233   PHURINE 8.0 04/18/2018 1759   GLUCOSEU NEGATIVE 04/18/2018 1759   GLUCOSEU Negative 01/11/2016 1233   HGBUR SMALL (A) 04/18/2018 1759   BILIRUBINUR NEGATIVE 04/18/2018 1759   BILIRUBINUR small 12/03/2016 1117   BILIRUBINUR Negative 01/11/2016 [place] 04/18/2018 1759   PROTEINUR NEGATIVE 04/18/2018 1759   UROBILINOGEN 1.0 12/03/2016 1117   UROBILINOGEN 1.0 09/13/2016 1306   UROBILINOGEN 0.2 01/11/2016 1233   NITRITE NEGATIVE 04/18/2018 1759   LEUKOCYTESUR NEGATIVE 04/18/2018 1759   LEUKOCYTESUR Small 01/11/2016 1233   Sepsis Labs: @LABRCNTIP (procalcitonin:4,lacticidven:4)  ) Recent Results (from the past 240 hour(s))  Culture, Urine     Status: Abnormal   Collection Time: 04/18/18  6:00 PM  Result Value Ref Range Status   Specimen Description   Final    URINE, CATHETERIZED Performed at [facility], [place] [address] [place], [place] [address]    Special Requests    Final    Immunocompromised Performed at [facility], [place] [address] [place], [place] [address]    Culture MULTIPLE SPECIES PRESENT, SUGGEST RECOLLECTION (A)  Final   Report Status 04/21/2018 FINAL  Final  Culture, blood (routine x 2)     Status: None (Preliminary result)   Collection Time: 04/20/18 12:46 AM  Result Value Ref Range Status   Specimen Description   Final    BLOOD RIGHT ANTECUBITAL Performed at [place] [address] [place], [place] [address]    Special Requests   Final    BOTTLES DRAWN AEROBIC ONLY Blood Culture adequate volume Performed at [place] [address] [place], [place] [address]    Culture   Final    NO GROWTH 1 DAY Performed at [facility], [place] [address], [place], [place] [address]    Report Status PENDING  Incomplete  Culture, blood (routine x 2)     Status: None (Preliminary result)   Collection Time: 04/20/18 12:46 AM  Result Value Ref Range Status   Specimen Description   Final    BLOOD RIGHT HAND Performed at [place] [address], [place], [place] [address]    Special Requests   Final    BOTTLES DRAWN AEROBIC ONLY Blood Culture adequate volume Performed at [place] [address] [place], [place] [address]    Culture   Final    NO GROWTH 1 DAY Performed at [facility], [place] [address], [place], [place] [address]    Report Status PENDING  Incomplete      Studies: No results found.  Scheduled Meds: . amLODipine  10 mg Oral Daily  . bisacodyl  10 mg Rectal Once  . calcitonin  400 Units Intramuscular BID  . enoxaparin  100 mg Subcutaneous Q12H  . feeding supplement (ENSURE ENLIVE)  237 mL Oral BID BM  . fluticasone furoate-vilanterol  1 puff Inhalation Daily  . folic acid  1 mg Oral Daily  . hydrALAZINE  10 mg Oral Q8H  . loratadine  10 mg Oral Daily  . mouth rinse  15 mL Mouth Rinse BID  .  polyethylene glycol  17 g Oral Daily  . senna-docusate  2 tablet Oral BID  . cyanocobalamin  1,000 mcg Oral Daily    Continuous Infusions:    LOS: 3 days  Kayleen Memos, MD [facility] Pager [phone]  If 7PM-7AM, please contact night-coverage [email or web address] Password [facility] 04/21/2018, 2:08 PM

## 2018-04-21 NOTE — Progress Notes (Signed)
Michaela Little   DOB:06/03/1960   QI#:[id number]   HER#:[id number]  Oncology follow up note   Subjective: Patient is well-known to me, under my care for her metastatic breast cancer.  She was admitted from our office on April 15, 2018, due to severe hypercalcemia, and hypokalemia.  Her hypercalcemia has resolved, she developed a fever yesterday, afebrile today, still few quite fatigued, complains of left chest wall, and some abdominal pain.   Objective:  Vitals:   04/21/18 1600 04/21/18 2000  BP:    Pulse:  94  Resp:  16  Temp: 98.7 F (37.1 C) 98.7 F (37.1 C)  SpO2:  100%    Body mass index is 36.67 kg/m.  Intake/Output Summary (Last 24 hours) at 04/21/2018 2057 Last data filed at 04/21/2018 1811 Gross per 24 hour  Intake 3013.33 ml  Output 2950 ml  Net 63.33 ml     Sclerae unicteric  Oropharynx clear  No peripheral adenopathy  Lungs clear -- no rales or rhonchi  Heart regular rate and rhythm  Abdomen benign  MSK no focal spinal tenderness, no peripheral edema  Neuro nonfocal   CBG (last 3)  Recent Labs    04/19/18 1351  GLUCAP 119*     Labs:  Lab Results  Component Value Date   WBC 3.1 (L) 04/20/2018   HGB 5.1 (LL) 04/20/2018   HCT 18.0 (L) 04/20/2018   MCV 98.4 04/20/2018   PLT 98 (L) 04/20/2018   NEUTROABS 1.9 04/18/2018   CMP Latest Ref Rng & Units 04/21/2018 04/20/2018 04/19/2018  Glucose 70 - 99 mg/dL 127(H) 123(H) 137(H)  BUN 6 - 20 mg/dL 16 17 14   Creatinine 0.44 - 1.00 mg/dL 1.17(H) 1.65(H) 1.54(H)  Sodium 135 - 145 mmol/L 133(L) 138 144  Potassium 3.5 - 5.1 mmol/L 3.5 3.2(L) 3.0(L)  Chloride 98 - 111 mmol/L 103 102 107  CO2 22 - 32 mmol/L 22 27 29   Calcium 8.9 - 10.3 mg/dL 9.0 11.3(H) 14.5(HH)  Total Protein 6.5 - 8.1 g/dL 6.6 7.1 7.3  Total Bilirubin 0.3 - 1.2 mg/dL 1.1 1.3(H) 1.2  Alkaline Phos 38 - 126 U/L 74 84 94  AST 15 - 41 U/L 38 31 32  ALT 0 - 44 U/L 13 13 14      Urine Studies No results for input(s): UHGB, CRYS in the last 72  hours.  Invalid input(s): UACOL, UAPR, USPG, UPH, UTP, UGL, UKET, UBIL, UNIT, UROB, ULEU, UEPI, UWBC, [place], [place]  Basic Metabolic Panel: Recent Labs  Lab 04/18/18 1846 04/18/18 1849 04/19/18 0546 04/19/18 1237 04/20/18 0322 04/21/18 0310  NA  --  143 143 144 138 133*  K  --  2.7* 3.1* 3.0* 3.2* 3.5  CL  --  102 105 107 102 103  CO2  --  31 29 29 27 22   GLUCOSE  --  112* 116* 137* 123* 127*  BUN  --  12 13 14 17 16   CREATININE  --  1.31* 1.52* 1.54* 1.65* 1.17*  CALCIUM  --  14.4* >15.0* 14.5* 11.3* 9.0  MG 2.0  --   --   --   --   --   PHOS 4.2  --   --   --   --   --    GFR Estimated Creatinine Clearance: 64.1 mL/min (A) (by C-G formula based on SCr of 1.17 mg/dL (H)). Liver Function Tests: Recent Labs  Lab 04/18/18 1849 04/19/18 0546 04/19/18 1237 04/20/18 0322 04/21/18 0310  AST  29 30 32 31 38  ALT 13 12 14 13 13   ALKPHOS 93 90 94 84 74  BILITOT 1.7* 1.4* 1.2 1.3* 1.1  PROT 7.7 6.9 7.3 7.1 6.6  ALBUMIN 3.9 3.5 3.8 3.5 3.2*   No results for input(s): LIPASE, AMYLASE in the last 168 hours. No results for input(s): AMMONIA in the last 168 hours. Coagulation profile No results for input(s): INR, PROTIME in the last 168 hours.  CBC: Recent Labs  Lab 04/18/18 1423 04/18/18 1846 04/19/18 0546 04/20/18 0322  WBC 3.8* 3.6* 3.5* 3.1*  NEUTROABS 1.9  --   --   --   HGB 5.8* 5.2* 5.1* 5.1*  HCT 19.9* 18.3* 17.9* 18.0*  MCV 95.7 98.9 96.8 98.4  PLT 124* 119* 126* 98*   Cardiac Enzymes: No results for input(s): CKTOTAL, CKMB, CKMBINDEX, TROPONINI in the last 168 hours. BNP: Invalid input(s): POCBNP CBG: Recent Labs  Lab 04/19/18 1351  GLUCAP 119*   D-Dimer No results for input(s): DDIMER in the last 72 hours. Hgb A1c No results for input(s): HGBA1C in the last 72 hours. Lipid Profile No results for input(s): CHOL, HDL, LDLCALC, TRIG, CHOLHDL, LDLDIRECT in the last 72 hours. Thyroid function studies No results for input(s): TSH,  T4TOTAL, T3FREE, THYROIDAB in the last 72 hours.  Invalid input(s): FREET3 Anemia work up No results for input(s): VITAMINB12, FOLATE, FERRITIN, TIBC, IRON, RETICCTPCT in the last 72 hours. Microbiology Recent Results (from the past 240 hour(s))  Culture, Urine     Status: Abnormal   Collection Time: 04/18/18  6:00 PM  Result Value Ref Range Status   Specimen Description   Final    URINE, CATHETERIZED Performed at [facility], [place] [address] [place], [place] [address]    Special Requests   Final    Immunocompromised Performed at [facility], [place] [address] [place], [place] [address]    Culture MULTIPLE SPECIES PRESENT, SUGGEST RECOLLECTION (A)  Final   Report Status 04/21/2018 FINAL  Final  Culture, blood (routine x 2)     Status: None (Preliminary result)   Collection Time: 04/20/18 12:46 AM  Result Value Ref Range Status   Specimen Description   Final    BLOOD RIGHT ANTECUBITAL Performed at [facility] [address], [place], [place] [address]    Special Requests   Final    BOTTLES DRAWN AEROBIC ONLY Blood Culture adequate volume Performed at [facility] [address], [place], [place] [address]    Culture   Final    NO GROWTH 1 DAY Performed at [facility], [place] [address], [place], [place] [address]    Report Status PENDING  Incomplete  Culture, blood (routine x 2)     Status: None (Preliminary result)   Collection Time: 04/20/18 12:46 AM  Result Value Ref Range Status   Specimen Description   Final    BLOOD RIGHT HAND Performed at [facility] [address] [place], [place] [address]    Special Requests   Final    BOTTLES DRAWN AEROBIC ONLY Blood Culture adequate volume Performed at [facility] [address] [place], [place] [address]    Culture   Final    NO GROWTH 1 DAY Performed at [facility], [place] [address],  [place], [place] [address]    Report Status PENDING  Incomplete      Studies:  No results found.  Assessment: 58 y.o.  with metastatic breast cancer to bones, peritoneum, severe anemia, Jehovah witness, DVT, was admitted for severe hypocalcemia, hypokalemia and AKI.  1.  Severe hypocalcemia, secondary to malignancy. 2.  Hypokalemia, resolved 3. AKI, improved  4.  Metastatic breast cancer, off treatment per pt's request  5. Severe anemia, secondary to underlying malignancy and bone metastasis, she is a Jehovah witness 6. History of DVT and PE 7.  Normal cytopenia, related to her malignancy 8. Deconditioning  9.  Diastolic CHF 10 fever 8/30, cultures (-) so far  11. Chronic cough   Plan:  -her hypercalcemia has resolved after medical treatment -if she remains to be afebrile and cultures (-), she is OK to be discharged from her oncology standpoint  -I again encourage her to consider anticancer treatment, she agrees to start fulvestrant injection after her discharge -I will set up her f/u appointments when she is discharged  -I appreciate the excellent care from the hospitalist team   [name], MD  04/21/2018

## 2018-04-21 NOTE — Care Management Note (Signed)
Case Management Note  Patient Details  Name: ILLONA BULMAN MRN: [id number] Date of Birth: October 13, 1960  Subjective/Objective:                  Discharge Readiness Return to top of Sepsis and Other Febrile Illness, without Focal Infection RRG - [place]  Discharge readiness is indicated by patient meeting Recovery Milestones, including ALL of the following: ? Hemodynamic stability  yes ? Fever absent or reduced 98.7 ? Hypoxemia absent on o2 via [place] ? Mental status at baseline yes ? End-organ dysfunction (eg, myocardial ischemia, renal failure) absent ? Metabolic derangement (eg, dehydration, acidosis) absent  Na=133/bun-16/creat-1.17 ? Cultures negative or infection identified and under adequate treatment  Urine culture pending/ ? Ambulatory yes ? Oral hydration, medications[P] ? Regular room diet ? Level of care SDU   Action/Plan: Following for progression of care and condition. Following for cm needs none present at this time.  Expected Discharge Date:  (unknown)               Expected Discharge Plan:     In-House Referral:     Discharge planning Services     Post Acute Care Choice:    Choice offered to:     DME Arranged:    DME Agency:     HH Arranged:    HH Agency:     Status of Service:     If discussed at [employer], dates discussed:    Additional Comments:  Leeroy Cha, RN 04/21/2018, 10:01 AM

## 2018-04-21 NOTE — Evaluation (Signed)
Physical Therapy Evaluation Patient Details Name: Michaela Little MRN: [id number] DOB: 12-12-1960 Today's Date: 04/21/2018   History of Present Illness  Michaela Little is a 58 y.o. 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 Patient should progress to return home with family assisting. Pt admitted with above diagnosis. Pt currently with functional limitations due to the deficits listed below (see PT Problem List).  Pt will benefit from skilled PT to increase their independence and safety with mobility to allow discharge to the venue listed below.       Follow Up Recommendations Home health PT    Equipment Recommendations  None recommended by PT    Recommendations for Other Services OT consult     Precautions / Restrictions Precautions Precautions: Fall Precaution Comments: on lasix, HGB 5.1, no blood products Restrictions Weight Bearing Restrictions: No      Mobility  Bed Mobility Overal bed mobility: Needs Assistance Bed Mobility: Supine to Sit     Supine to sit: Min guard     General bed mobility comments: extra time, falls back asleep, cues for mobility, stay aroused  Transfers Overall transfer level: Needs assistance Equipment used: None Transfers: Sit to/from [facility] Sit to Stand: Min guard Stand pivot transfers: Min guard       General transfer comment: from bed to [facility] then to recliner, patient manages self with occassional 1 UE support  Ambulation/Gait             General Gait Details: NT, patient very drowsy, barely stays awake unless  moving.  Stairs            Wheelchair Mobility    Modified Rankin (Stroke Patients Only)       Balance Overall balance assessment: Needs assistance Sitting-balance support: Feet supported;No upper extremity supported Sitting balance-Leahy Scale: Good     Standing balance support: During functional activity;No upper extremity supported Standing balance-Leahy Scale: Fair Standing balance comment: performed pericare  while standing, managed briefs and pad                             Pertinent Vitals/Pain Pain Assessment: No/denies pain    Home Living Family/patient expects to be discharged to:: Private residence Living Arrangements: Alone Available Help at Discharge: Family;Available PRN/intermittently   Home Access: Level entry     Home Layout: One level Home Equipment: Walker - 2 wheels;Walker - 4 wheels;Wheelchair - manual      Prior Function Level of Independence: Independent with assistive device(s)               Hand Dominance   Dominant Hand: Right    Extremity/Trunk Assessment   Upper Extremity Assessment Upper Extremity Assessment: Generalized weakness    Lower Extremity Assessment Lower Extremity Assessment: Generalized weakness    Cervical / Trunk Assessment Cervical / Trunk Assessment: Normal  Communication   Communication: No difficulties  Cognition Arousal/Alertness: Lethargic Behavior During Therapy: WFL for tasks assessed/performed Overall Cognitive Status: Within Functional Limits for tasks assessed  General Comments      Exercises     Assessment/Plan    PT Assessment Patient needs continued PT services  PT Problem List Decreased strength;Decreased activity tolerance;Decreased mobility;Decreased knowledge of precautions;Decreased safety awareness;Decreased knowledge of use of DME       PT Treatment Interventions DME instruction;Gait  training;Functional mobility training;Therapeutic activities;Patient/family education    PT Goals (Current goals can be found in the Care Plan section)  Acute Rehab PT Goals Patient Stated Goal: to return home Time For Goal Achievement: 05/05/18 Potential to Achieve Goals: Good    Frequency Min 3X/week   Barriers to discharge        Co-evaluation               AM-PAC PT "6 Clicks" Mobility  Outcome Measure Help needed turning from your back to your side while in a flat bed without using bedrails?: A Lot Help needed moving from lying on your back to sitting on the side of a flat bed without using bedrails?: A Lot Help needed moving to and from a bed to a chair (including a wheelchair)?: A Lot Help needed standing up from a chair using your arms (e.g., wheelchair or bedside chair)?: A Lot Help needed to walk in hospital room?: A Lot Help needed climbing 3-5 steps with a railing? : Total 6 Click Score: 11    End of Session   Activity Tolerance: Patient tolerated treatment well Patient left: in chair;with call bell/phone within reach;with chair alarm set Nurse Communication: Mobility status PT Visit Diagnosis: Unsteadiness on feet (R26.81)    Time: 7371-0626 PT Time Calculation (min) (ACUTE ONLY): 33 min   Charges:   PT Evaluation $PT Eval Low Complexity: 1 Low PT Treatments $Self Care/Home Management: 8-22        [place] Pager [phone] Office [phone]   Claretha Cooper 04/21/2018, 10:30 AM

## 2018-04-21 NOTE — Telephone Encounter (Signed)
No los per 01/24.

## 2018-04-22 LAB — COMPREHENSIVE METABOLIC PANEL
ALBUMIN: 3.1 g/dL — AB (ref 3.5–5.0)
ALT: 12 U/L (ref 0–44)
AST: 37 U/L (ref 15–41)
Alkaline Phosphatase: 79 U/L (ref 38–126)
Anion gap: 10 (ref 5–15)
BUN: 16 mg/dL (ref 6–20)
CO2: 21 mmol/L — ABNORMAL LOW (ref 22–32)
Calcium: 8.5 mg/dL — ABNORMAL LOW (ref 8.9–10.3)
Chloride: 104 mmol/L (ref 98–111)
Creatinine, Ser: 0.97 mg/dL (ref 0.44–1.00)
GFR calc Af Amer: >60 mL/min (ref >60)
GFR calc non Af Amer: >60 mL/min (ref >60)
Glucose, Bld: 108 mg/dL — ABNORMAL HIGH (ref 70–99)
POTASSIUM: 3.2 mmol/L — AB (ref 3.5–5.1)
Sodium: 135 mmol/L (ref 135–145)
Total Bilirubin: 0.9 mg/dL (ref 0.3–1.2)
Total Protein: 6.9 g/dL (ref 6.5–8.1)

## 2018-04-22 LAB — TROPONIN I

## 2018-04-22 MED ORDER — POTASSIUM CHLORIDE CRYS ER 20 MEQ PO TBCR
30.0000 meq | EXTENDED_RELEASE_TABLET | Freq: Once | ORAL | Status: AC
  Administered 2018-04-22: 30 meq via ORAL
  Filled 2018-04-22: qty 1

## 2018-04-22 MED ORDER — POTASSIUM CHLORIDE CRYS ER 20 MEQ PO TBCR
40.0000 meq | EXTENDED_RELEASE_TABLET | Freq: Two times a day (BID) | ORAL | Status: AC
  Administered 2018-04-22 (×2): 40 meq via ORAL
  Filled 2018-04-22 (×2): qty 2

## 2018-04-22 MED ORDER — AMLODIPINE BESYLATE 10 MG PO TABS: 10.0000 mg | ORAL_TABLET | Freq: Every day | ORAL | 0 refills | Status: DC

## 2018-04-22 MED ORDER — POLYETHYLENE GLYCOL 3350 17 G PO PACK: 17.0000 g | PACK | Freq: Every day | ORAL | 0 refills | Status: DC

## 2018-04-22 MED ORDER — HYDRALAZINE HCL 10 MG PO TABS: 10.0000 mg | ORAL_TABLET | Freq: Three times a day (TID) | ORAL | 0 refills | Status: AC

## 2018-04-22 MED ORDER — MAGNESIUM SULFATE 2 GM/50ML IV SOLN
2.0000 g | Freq: Once | INTRAVENOUS | Status: AC
  Administered 2018-04-22: 2 g via INTRAVENOUS
  Filled 2018-04-22: qty 50

## 2018-04-22 NOTE — Care Management Important Message (Signed)
Important Message  Patient Details  Name: Michaela Little MRN: [id number] Date of Birth: 1960-08-30   Medicare Important Message Given:  Yes    Kerin Salen 04/22/2018, 12:30 PMImportant Message  Patient Details  Name: Michaela Little MRN: [id number] Date of Birth: 10/17/1960   Medicare Important Message Given:  Yes    Kerin Salen 04/22/2018, 12:30 PM

## 2018-04-22 NOTE — Progress Notes (Signed)
Spoke with care Management concerning home care plans for discharge later today. SRP,RN

## 2018-04-22 NOTE — Progress Notes (Signed)
Pt discharged to home instruction reviewed with patient, acknowledged understanding. SRP, RN

## 2018-04-22 NOTE — Progress Notes (Signed)
PIV consult: In to see pt, she declined IV start. Stated she is going home today. Discussed with Hinton Dyer, RN, she will re-enter consult if PIV is needed.

## 2018-04-22 NOTE — Progress Notes (Signed)
SDU charge RN asked NP to see if this pt can move out due to bed shortage. NP reviewed chart. Pt admitted due to hypercalcemia which is now normal. She is followed by [facility] for metastatic breast cancer and she has been followed in hospital by them. Per oncology note 1/27, she is ready for d/c per them. VS are stable. She is eating and drinking. 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 Hgb stable in the 5's. Will go ahead and move pt to tele.  KJKG, NP [facility]

## 2018-04-22 NOTE — Care Management Note (Signed)
Case Management Note  Patient Details  Name: CHRISSI CROW MRN: [id number] Date of Birth: 1960-04-30  Subjective/Objective:                  Discharge planning  Action/Plan: rn , pt ,ot and aide has an Psychologist, sport and exercise through [facility] at [phone] has [facility] notified of the need for the RN, pt, ot and aide  Expected Discharge Date:  04/22/18               Expected Discharge Plan:  [place]  In-House Referral:     Discharge planning Services  CM Consult  Post Acute Care Choice:    Choice offered to:  Patient  DME Arranged:    DME Agency:     HH Arranged:  RN, PT, OT, Nurse's Aide [place] Agency:  [facility]  Status of Service:  In process, will continue to follow  If discussed at Long Length of Stay Meetings, dates discussed:    Additional Comments:  Leeroy Cha, RN 04/22/2018, 1:00 PM

## 2018-04-22 NOTE — Evaluation (Signed)
Occupational Therapy Evaluation Patient Details Name: Michaela Little MRN: [id number] DOB: 1960/08/18 Today's Date: 04/22/2018    History of Present Illness Michaela Little is a 58 y.o. 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  At baseline, she lives alone and sometimes her daughter comes to assist her. She also has an aide 2x week for one hour through palliative services.  She will benefit from continued OT to increase safety and independence with adls.  Goals are for supervision level     Follow Up Recommendations  Home health OT    Equipment Recommendations  None recommended by OT(has [facility])    Recommendations for Other Services       Precautions / Restrictions Precautions Precautions: Fall Precaution Comments: on lasix, HGB 5.1, no blood products Restrictions Weight Bearing Restrictions: No      Mobility Bed Mobility               General bed mobility comments: sit to supine:  supervision  Transfers                 General transfer comment: not observed:  min guard by PT on last visit    Balance                                           ADL either performed or assessed with clinical judgement   ADL Overall ADL's : Needs assistance/impaired Eating/Feeding: Independent   Grooming: Set up   Upper Body Bathing: Set up   Lower Body Bathing: Moderate assistance   Upper Body Dressing : Set up   Lower Body Dressing: Maximal assistance                 General ADL Comments: pt had just gotten off commode with RN assisting  Educated on AE for increased independence and energy conservation. Pt is  not interested in sock aide.      Vision         Perception     Praxis      Pertinent Vitals/Pain Pain Assessment: Faces Faces Pain Scale: Hurts even more Pain Location: vagina Pain Descriptors / Indicators: Aching Pain Intervention(s): Limited activity within patient's tolerance;Monitored during session;Repositioned     Hand Dominance     Extremity/Trunk Assessment Upper Extremity Assessment Upper Extremity Assessment: Generalized weakness           Communication Communication Communication: No difficulties   Cognition Arousal/Alertness: Awake/alert Behavior During Therapy: WFL for tasks assessed/performed Overall Cognitive Status: Within Functional Limits for tasks assessed                                     General Comments       Exercises     Shoulder Instructions      Home Living Family/patient expects to be discharged to:: Private residence Living Arrangements: Alone Available Help at Discharge: Family(palliative care aide)  Bathroom Toilet: Standard     Home Equipment: Bedside commode;Shower seat(hosptial bed)          Prior Functioning/Environment          Comments: has help 2x week for shower.  Daughter sometimes helps; palliative care comes 2x week to help.  Reports her abilities for adls vary        OT Problem List: Decreased strength;Decreased activity tolerance;Pain;Decreased knowledge of use of DME or AE      OT Treatment/Interventions: Self-care/ADL training;DME and/or AE instruction;Energy conservation;Patient/family education;Therapeutic activities(balance, if needed)    OT Goals(Current goals can be found in the care plan section) Acute Rehab OT Goals Patient Stated Goal: to return home OT Goal Formulation: With patient Time For Goal Achievement: 05/06/18 Potential to Achieve Goals: Good ADL Goals Pt Will Transfer to Toilet: with supervision;ambulating;bedside commode Additional  ADL Goal #1: pt will perform ADL with set up using AE as needed without assistance/supervision (except for socks if worn) Additional ADL Goal #2: pt will initiate at least one rest break for energy conservation  OT Frequency: Min 2X/week   Barriers to D/C:            Co-evaluation              AM-PAC OT "6 Clicks" Daily Activity     Outcome Measure Help from another person eating meals?: None Help from another person taking care of personal grooming?: A Little Help from another person toileting, which includes using toliet, bedpan, or urinal?: A Little Help from another person bathing (including washing, rinsing, drying)?: A Lot Help from another person to put on and taking off regular upper body clothing?: A Little Help from another person to put on and taking off regular lower body clothing?: A Lot 6 Click Score: 17   End of Session    Activity Tolerance: Patient limited by fatigue Patient left: in bed;with call bell/phone within reach  OT Visit Diagnosis: Muscle weakness (generalized) (M62.81)                Time: 4128-7867 OT Time Calculation (min): 14 min Charges:  OT General Charges $OT Visit: 1 Visit OT Evaluation $OT Eval Low Complexity: [place], OTR/L Acute Rehabilitation Services [phone] [facility] pager [phone] office 04/22/2018  [place] 04/22/2018, 12:35 PM

## 2018-04-22 NOTE — Discharge Summary (Signed)
Discharge Summary  Michaela Little NWG:[id number] DOB: 1960/04/04  PCP: Ladell Pier, MD  Admit date: 04/18/2018 Discharge date: 04/22/2018  Time spent: 35 minutes  Recommendations for Outpatient Follow-up:  1. Follow-up with your oncologist 2. Follow-up with your PCP 3. Take medication as prescribed 4. Continue physical therapy 5. Fall precautions  Discharge Diagnoses:  Active Hospital Problems   Diagnosis Date Noted  . Hypercalcemia 04/18/2018    Resolved Hospital Problems  No resolved problems to display.    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 She went to her routine follow-up appointment today. Lab studies wereremarkable for significant hypercalcemia, profound hypokalemia, severe normocytic anemia and AKI. Patient reports in the last week she has had generalized weakness and diffuse bone pain. Denies any fever chills but admits to dysuria and polyuria.  04/19/18: Fatigue and generalized weakness. 04/20/18: Fever with T-max of 102.3.  Moved to stepdown unit. 04/21/2018: Calcium level trending down and renal function improving.   04/22/2018: Patient seen and examined at her bedside.  No acute events overnight.  She has no new complaints.  On the day of discharge, the patient was hemodynamically stable.  She will need to follow-up with her oncologist and PCP post hospitalization.  She will also need to take her medications as prescribed and continue physical therapy as  tolerated.  Hospital Course:  Active Problems:   Hypercalcemia  Resolved severe symptomatic hypercalcemiaof malignancy  Presented with calcium level greater than 15 Received aggressive IV fluid hydration Received 1 dose IV biphosphonate Received 4 doses of calcitonin  Received IV Lasix due to swelling which has resolved Follow-up with Dr. Burr Medico outpatient  Resolved AKI, suspect prerenal in the setting of hypercalcemia Baseline creatinine is normal Creatinine peaked at 1.65 Discontinued IV fluids on 04/21/2018 and gave a dose of IV Lasix Creatinine on 04/22/2018 is 0.9 with GFR greater than 60, which is her baseline. Follow-up with your PCP and medical oncology  Resolved hypokalemia post repletion   Resolved hypervolemic hyponatremia  Resolved uncontrolled hypertension Continue amlodipine 10 mg daily Continue p.o. hydralazine 10 mg 3 times daily Follow-up with your PCP  Severe normocytic anemia Hemoglobin holding at 5.1 Plebotomy team instructed to use pediatric tubes Declines blood transfusion due to religious belief; [name] Last FOBT was negative in January 01, 2018 Iron studies unremarkable  Resolved intermittent chronic headaches and chronic chest pain, suspect related to her malignancy Headaches are improved with Tylenol Troponin negative less than 0.03 on 04/22/2018  Resolved elevated Tbilirubin Peaked at 1.7 On 04/22/2018 total bilirubin is 0.9  Thrombocytopenia, suspect related to chemotherapy plt 98K on 04/20/2018  No sign of overt bleeding Follow-up with medical oncology  Metastatic breast cancer status post left mastectomy Metastasis to bone and appendix Management per medical oncology Follow-up with Dr. Burr Medico  History of familial DVT and recent PE Continue full dose Lovenox Also has an IVC filter in place  Generalized weakness/fatigue/physical debility suspect multifactorial secondary to hypercalcemia, her malignancy Fall  precautions PT recommends home  health PT  Dysuria/polyuria Negative urine analysis Unremarkable urine culture  Chronic diastolic CHF Last 2D echo done on 03/02/2018 revealed LVEF 65 to 70% with grade 1 diastolic dysfunction No acute issues    DVT prophylaxis:Full dose Lovenox twice daily  Code Status:Full code  Consults called: Medical oncology, nephrology    Discharge Exam: BP (!) 146/68   Pulse 89   Temp 99.1 F (37.3 C) (Oral)   Resp 16   Ht 5\' 7"  (1.702 m) Comment: done upom arrival to unit at night  Wt 103.2 kg   LMP 02/25/2009   SpO2 99%   BMI 35.63 kg/m  . General: 58 y.o. year-old female well developed well nourished in no acute distress.  Alert and oriented x3. . Cardiovascular: Regular rate and rhythm with no rubs or gallops.  No thyromegaly or JVD noted.   Marland Kitchen Respiratory: Clear to auscultation with no wheezes or rales. Good inspiratory effort.  Left mastectomy noted. . Abdomen: Soft nontender nondistended with normal bowel sounds x4 quadrants. . Musculoskeletal: Trace lower extremity edema. 2/4 pulses in all 4 extremities. Marland Kitchen Psychiatry: Mood is appropriate for condition and setting  Discharge Instructions You were cared for by a hospitalist during your hospital stay. If you have any questions about your discharge medications or the care you received while you were in the hospital after you are discharged, you can call the unit and asked to speak with the hospitalist on call if the hospitalist that took care of you is not available. Once you are discharged, your primary care physician will handle any further medical issues. Please note that NO REFILLS for any discharge medications will be authorized once you are discharged, as it is imperative that you return to your primary care physician (or establish a relationship with a primary care physician if you do not have one) for your aftercare needs so that they can reassess your need for medications and  monitor your lab values.   Allergies as of 04/22/2018      Reactions   Lisinopril-hydrochlorothiazide Itching   Adhesive [tape] Itching, Other (See Comments)   Redness   Fentanyl Other (See Comments)   GI upset and drowsiness. Only to [facility]   Gabapentin Other (See Comments)   Auditory hallucinations   Hctz [hydrochlorothiazide] Itching   Latex Itching   Lisinopril Itching   Losartan Potassium Other (See Comments)   Makes her feel "bad"   Other Other (See Comments)   Patient refuses blood for religious reasons (per her notes)   Oxycodone    GI upset, drowsy   Tums [calcium Carbonate Antacid] Hives   FRUIT-FLAVORED ONES      Medication List    STOP taking these medications   exemestane 25 MG tablet Commonly known as:  AROMASIN   HYDROcodone-homatropine 5-1.5 MG/5ML syrup Commonly known as:  HYCODAN     TAKE these medications   amLODipine 10 MG tablet Commonly known as:  NORVASC Take 1 tablet (10 mg total) by mouth daily.   cetirizine 10 MG tablet Commonly known as:  ZYRTEC Take 10 mg by mouth daily.   cyanocobalamin 1000 MCG tablet Take 1 tablet (1,000 mcg total) by mouth daily.   enoxaparin 120 MG/0.8ML injection Commonly known as:  LOVENOX Inject 0.73 mLs (110 mg total) into the skin every 12 (twelve) hours.   fluticasone furoate-vilanterol 100-25 MCG/INH Aepb Commonly known as:  BREO ELLIPTA Inhale 1 puff into the lungs daily. What changed:    when to take this  reasons to take this   folic acid 1 MG tablet Commonly known as:  FOLVITE Take 1 tablet (1 mg total) by mouth daily.   hydrALAZINE 10 MG tablet Commonly known as:  APRESOLINE Take 1 tablet (10 mg total) by mouth 3 (three) times daily.   ipratropium-albuterol 0.5-2.5 (3) MG/3ML Soln Commonly known as:  DUONEB INHALE THREE MLS VIA NEBULIZER EVERY 6 HOURS AS NEEDED What changed:  See the new instructions.   MULTIVITAMIN ADULT PO Take 1 tablet by mouth daily.   multivitamin  tablet Take 1 tablet by mouth daily.   polyethylene glycol packet Commonly known as:  MIRALAX / GLYCOLAX Take 17 g by mouth daily.   traMADol 50 MG tablet Commonly known as:  ULTRAM Take 2 tablets (100 mg total) by mouth every 6 (six) hours. What changed:    when to take this  reasons to take this      Allergies  Allergen Reactions  . Lisinopril-Hydrochlorothiazide Itching  . Adhesive [Tape] Itching and Other (See Comments)    Redness  . Fentanyl Other (See Comments)    GI upset and drowsiness. Only to [facility]  . Gabapentin Other (See Comments)    Auditory hallucinations  . Hctz [Hydrochlorothiazide] Itching  . Latex Itching  . Lisinopril Itching  . Losartan Potassium Other (See Comments)    Makes her feel "bad"   . Other Other (See Comments)    Patient refuses blood for religious reasons (per her notes)  . Oxycodone     GI upset, drowsy  . Tums [Calcium Carbonate Antacid] Hives    FRUIT-FLAVORED ONES      The results of significant diagnostics from this hospitalization (including imaging, microbiology, ancillary and laboratory) are listed below for reference.    Significant Diagnostic Studies: Ct Angio Chest Pe W And/or Wo Contrast  Result Date: 04/02/2018 CLINICAL DATA:  58 year old female with history of bilateral flank pain and foul-smelling urine. History of metastatic breast cancer. Shortness of breath. EXAM: CT ANGIOGRAPHY CHEST CT ABDOMEN AND PELVIS WITH CONTRAST TECHNIQUE: Multidetector CT imaging of the chest was performed using the standard protocol during bolus administration of intravenous contrast. Multiplanar CT image reconstructions and MIPs were obtained to evaluate the vascular anatomy. Multidetector CT imaging of the abdomen and pelvis was performed using the standard protocol during bolus administration of intravenous contrast. CONTRAST:  136mL ISOVUE-370 IOPAMIDOL (ISOVUE-370) INJECTION 76% COMPARISON:  PET-CT 01/15/2018. CT the chest,  abdomen and pelvis 08/27/2017. FINDINGS: CTA CHEST FINDINGS Cardiovascular: No filling defects in the pulmonary arterial tree to suggest underlying pulmonary embolism. Heart size is mildly enlarged. There is no significant pericardial fluid, thickening or pericardial calcification. Aortic atherosclerosis. No definite coronary artery calcifications. Mediastinum/Nodes: No pathologically enlarged mediastinal or hilar lymph nodes. Esophagus is unremarkable in appearance. No axillary lymphadenopathy. Numerous surgical clips in the left axillary region from prior lymph node dissection. Lungs/Pleura: No suspicious appearing pulmonary nodules or masses are noted. No acute consolidative airspace disease. No pleural effusions. Musculoskeletal: Status post left radical mastectomy and left axillary lymph node dissection. Innumerable mixed lytic and sclerotic lesions throughout the visualized axial and appendicular skeleton, compatible with widespread metastatic disease to the bones, progressive compared to the prior study from 08/27/2017. Review of the MIP images confirms the above findings. CT ABDOMEN and PELVIS FINDINGS Hepatobiliary: There are several small low-attenuation lesions scattered throughout the liver, at least 3 of which are new compared to prior studies and suspicious for metastatic lesions. The largest of these include a 12 x  14 mm lesion in segment 5 (axial image 31 of series 2), and a 15 x 12 mm lesion in the periphery of segment 8 (axial image 18 of series 2). No intra or extrahepatic biliary ductal dilatation. Status post cholecystectomy. Pancreas: No pancreatic mass. No pancreatic ductal dilatation. No pancreatic or peripancreatic fluid or inflammatory changes. Spleen: Unremarkable. Adrenals/Urinary Tract: 2.3 cm low-attenuation lesion in the lower pole of the right kidney, compatible with a simple cyst. Left kidney and bilateral adrenal glands are normal in appearance. No hydroureteronephrosis. Urinary  bladder is normal in appearance. Stomach/Bowel: Normal appearance of the stomach. No pathologic dilatation of small bowel or colon. Normal appendix. Vascular/Lymphatic: Aortic atherosclerosis, without evidence of aneurysm or dissection in the abdominal or pelvic vasculature. IVC filter in position with tip terminating immediately beneath the level of the renal veins. Large filling defect extending from the right common femoral vein into the proximal right external iliac vein (axial images 59-79 of series 2), compatible with a large deep venous thrombosis. Reproductive: Status post hysterectomy. Ovaries are not confidently identified may be surgically absent or atrophic. Other: No significant volume of ascites.  No pneumoperitoneum. Musculoskeletal: Innumerable mixed lytic and sclerotic lesions are noted throughout the visualized axial and appendicular skeleton, compatible with widespread metastatic disease to the bones, progressive compared to prior examinations. Review of the MIP images confirms the above findings. IMPRESSION: 1. Multiple new hepatic lesions highly concerning for developing metastatic disease to the liver. This could be confirmed with nonemergent MRI of the abdomen with and without IV gadolinium if clinically appropriate. 2. Widespread metastatic disease to the bones also appears progressive compared to prior examinations. 3. Large right-sided pelvic deep venous thrombosis extending from the right common femoral vein to the proximal right external iliac vein. IVC filter is in position. No clot identified in the IVC filter at this time. No pulmonary embolism on today's examination. 4. Aortic atherosclerosis. 5. Additional incidental findings, as above. Electronically Signed   By: Vinnie Langton M.D.   On: 04/02/2018 15:05   Ct Abdomen Pelvis W Contrast  Result Date: 04/02/2018 CLINICAL DATA:  58 year old female with history of bilateral flank pain and foul-smelling urine. History of metastatic  breast cancer. Shortness of breath. EXAM: CT ANGIOGRAPHY CHEST CT ABDOMEN AND PELVIS WITH CONTRAST TECHNIQUE: Multidetector CT imaging of the chest was performed using the standard protocol during bolus administration of intravenous contrast. Multiplanar CT image reconstructions and MIPs were obtained to evaluate the vascular anatomy. Multidetector CT imaging of the abdomen and pelvis was performed using the standard protocol during bolus administration of intravenous contrast. CONTRAST:  138mL ISOVUE-370 IOPAMIDOL (ISOVUE-370) INJECTION 76% COMPARISON:  PET-CT 01/15/2018. CT the chest, abdomen and pelvis 08/27/2017. FINDINGS: CTA CHEST FINDINGS Cardiovascular: No filling defects in the pulmonary arterial tree to suggest underlying pulmonary embolism. Heart size is mildly enlarged. There is no significant pericardial fluid, thickening or pericardial calcification. Aortic atherosclerosis. No definite coronary artery calcifications. Mediastinum/Nodes: No pathologically enlarged mediastinal or hilar lymph nodes. Esophagus is unremarkable in appearance. No axillary lymphadenopathy. Numerous surgical clips in the left axillary region from prior lymph node dissection. Lungs/Pleura: No suspicious appearing pulmonary nodules or masses are noted. No acute consolidative airspace disease. No pleural effusions. Musculoskeletal: Status post left radical mastectomy and left axillary lymph node dissection. Innumerable mixed lytic and sclerotic lesions throughout the visualized axial and appendicular skeleton, compatible with widespread metastatic disease to the bones, progressive compared to the prior study from 08/27/2017. Review of the MIP images confirms the above findings.  CT ABDOMEN and PELVIS FINDINGS Hepatobiliary: There are several small low-attenuation lesions scattered throughout the liver, at least 3 of which are new compared to prior studies and suspicious for metastatic lesions. The largest of these include a 12 x 14  mm lesion in segment 5 (axial image 31 of series 2), and a 15 x 12 mm lesion in the periphery of segment 8 (axial image 18 of series 2). No intra or extrahepatic biliary ductal dilatation. Status post cholecystectomy. Pancreas: No pancreatic mass. No pancreatic ductal dilatation. No pancreatic or peripancreatic fluid or inflammatory changes. Spleen: Unremarkable. Adrenals/Urinary Tract: 2.3 cm low-attenuation lesion in the lower pole of the right kidney, compatible with a simple cyst. Left kidney and bilateral adrenal glands are normal in appearance. No hydroureteronephrosis. Urinary bladder is normal in appearance. Stomach/Bowel: Normal appearance of the stomach. No pathologic dilatation of small bowel or colon. Normal appendix. Vascular/Lymphatic: Aortic atherosclerosis, without evidence of aneurysm or dissection in the abdominal or pelvic vasculature. IVC filter in position with tip terminating immediately beneath the level of the renal veins. Large filling defect extending from the right common femoral vein into the proximal right external iliac vein (axial images 59-79 of series 2), compatible with a large deep venous thrombosis. Reproductive: Status post hysterectomy. Ovaries are not confidently identified may be surgically absent or atrophic. Other: No significant volume of ascites.  No pneumoperitoneum. Musculoskeletal: Innumerable mixed lytic and sclerotic lesions are noted throughout the visualized axial and appendicular skeleton, compatible with widespread metastatic disease to the bones, progressive compared to prior examinations. Review of the MIP images confirms the above findings. IMPRESSION: 1. Multiple new hepatic lesions highly concerning for developing metastatic disease to the liver. This could be confirmed with nonemergent MRI of the abdomen with and without IV gadolinium if clinically appropriate. 2. Widespread metastatic disease to the bones also appears progressive compared to prior  examinations. 3. Large right-sided pelvic deep venous thrombosis extending from the right common femoral vein to the proximal right external iliac vein. IVC filter is in position. No clot identified in the IVC filter at this time. No pulmonary embolism on today's examination. 4. Aortic atherosclerosis. 5. Additional incidental findings, as above. Electronically Signed   By: Vinnie Langton M.D.   On: 04/02/2018 15:05   Dg Chest Port 1 View  Result Date: 04/18/2018 CLINICAL DATA:  Productive cough and shortness of breath. History of breast cancer and hypertension. EXAM: PORTABLE CHEST 1 VIEW COMPARISON:  03/08/2018 and chest CTA dated 04/02/2018. FINDINGS: Stable mildly enlarged cardiac silhouette. Clear lungs with normal vascularity. Mild patchy bone sclerosis and multiple small lucent lesions in both shoulders. This was diffusely involving the regional skeleton on the recent CTA. Left axillary surgical clips. IMPRESSION: No acute abnormality. Stable mild cardiomegaly and diffuse bony metastatic disease. Electronically Signed   By: Claudie Revering M.D.   On: 04/18/2018 19:10   [facility] Radiologist Eval & Mgmt  Result Date: 04/08/2018 Please refer to notes tab for details about interventional procedure. (Op Note)   Microbiology: Recent Results (from the past 240 hour(s))  Culture, Urine     Status: Abnormal   Collection Time: 04/18/18  6:00 PM  Result Value Ref Range Status   Specimen Description   Final    URINE, CATHETERIZED Performed at [facility], [place] [address], [place], [place] [address]    Special Requests   Final    Immunocompromised Performed at [facility], [place] [address], [place], [place] [address]  Culture MULTIPLE SPECIES PRESENT, SUGGEST RECOLLECTION (A)  Final   Report Status 04/21/2018 FINAL  Final  Culture, blood (routine x 2)     Status: None (Preliminary result)   Collection Time: 04/20/18 12:46 AM  Result Value Ref Range Status    Specimen Description   Final    BLOOD RIGHT ANTECUBITAL Performed at [facility] [address] [place], [place] [address]    Special Requests   Final    BOTTLES DRAWN AEROBIC ONLY Blood Culture adequate volume Performed at [facility] [address], [place], [place] [address]    Culture   Final    NO GROWTH 2 DAYS Performed at [facility] [address] [place], [place] [address]    Report Status PENDING  Incomplete  Culture, blood (routine x 2)     Status: None (Preliminary result)   Collection Time: 04/20/18 12:46 AM  Result Value Ref Range Status   Specimen Description   Final    BLOOD RIGHT HAND Performed at [facility] [address] [place], [place] [address]    Special Requests   Final    BOTTLES DRAWN AEROBIC ONLY Blood Culture adequate volume Performed at [facility] [address], [place], [place] [address]    Culture   Final    NO GROWTH 2 DAYS Performed at [facility] [address], [place], [place] [address]    Report Status PENDING  Incomplete     Labs: Basic Metabolic Panel: Recent Labs  Lab 04/18/18 1846  04/19/18 0546 04/19/18 1237 04/20/18 0322 04/21/18 0310 04/22/18 0314  NA  --    < > 143 144 138 133* 135  K  --    < > 3.1* 3.0* 3.2* 3.5 3.2*  CL  --    < > 105 107 102 103 104  CO2  --    < > 29 29 27 22  21*  GLUCOSE  --    < > 116* 137* 123* 127* 108*  BUN  --    < > 13 14 17 16 16   CREATININE  --    < > 1.52* 1.54* 1.65* 1.17* 0.97  CALCIUM  --    < > >15.0* 14.5* 11.3* 9.0 8.5*  MG 2.0  --   --   --   --   --   --   PHOS 4.2  --   --   --   --   --   --    < > = values in this interval not displayed.   Liver Function Tests: Recent Labs  Lab 04/19/18 0546 04/19/18 1237 04/20/18 0322 04/21/18 0310 04/22/18 0314  AST 30 32 31 38 37  ALT 12 14 13 13 12   ALKPHOS 90 94 84 74 79  BILITOT 1.4* 1.2 1.3* 1.1 0.9  PROT 6.9 7.3 7.1 6.6  6.9  ALBUMIN 3.5 3.8 3.5 3.2* 3.1*   No results for input(s): LIPASE, AMYLASE in the last 168 hours. No results for input(s): AMMONIA in the last 168 hours. CBC: Recent Labs  Lab 04/18/18 1423 04/18/18 1846 04/19/18 0546 04/20/18 0322  WBC 3.8* 3.6* 3.5* 3.1*  NEUTROABS 1.9  --   --   --   HGB 5.8* 5.2* 5.1* 5.1*  HCT 19.9* 18.3* 17.9* 18.0*  MCV 95.7 98.9 96.8 98.4  PLT 124* 119* 126* 98*   Cardiac Enzymes: Recent Labs  Lab 04/22/18 0825  TROPONINI <  0.03   BNP: BNP (last 3 results) Recent Labs    09/25/17 1420 01/01/18 1838 02/27/18 1237  BNP 20.4 25.8 15.6    ProBNP (last 3 results) No results for input(s): PROBNP in the last 8760 hours.  CBG: Recent Labs  Lab 04/19/18 1351  GLUCAP 119*       Signed:  Kayleen Memos, MD [facility] 04/22/2018, 11:58 AM

## 2018-04-22 NOTE — Progress Notes (Signed)
Received pt from SD in stable condition, tele placed and verified, pt ambulated to BR. Pt denies pain, slight nausea noted from move. SRP, RN

## 2018-04-24 ENCOUNTER — Encounter (HOSPITAL_COMMUNITY): Payer: Self-pay

## 2018-04-24 ENCOUNTER — Other Ambulatory Visit: Payer: Self-pay

## 2018-04-24 ENCOUNTER — Telehealth: Payer: Self-pay | Admitting: Hematology

## 2018-04-24 ENCOUNTER — Observation Stay (HOSPITAL_COMMUNITY)
Admission: EM | Admit: 2018-04-24 | Discharge: 2018-04-28 | Disposition: A | Discharge status: Home / Self Care | Payer: Medicare Other | Attending: Internal Medicine | Admitting: Internal Medicine

## 2018-04-24 DIAGNOSIS — Z79899 Other long term (current) drug therapy: Secondary | ICD-10-CM | POA: Insufficient documentation

## 2018-04-24 DIAGNOSIS — G4733 Obstructive sleep apnea (adult) (pediatric): Secondary | ICD-10-CM | POA: Diagnosis not present

## 2018-04-24 DIAGNOSIS — I10 Essential (primary) hypertension: Secondary | ICD-10-CM | POA: Diagnosis present

## 2018-04-24 DIAGNOSIS — R0600 Dyspnea, unspecified: Secondary | ICD-10-CM | POA: Diagnosis not present

## 2018-04-24 DIAGNOSIS — N3000 Acute cystitis without hematuria: Secondary | ICD-10-CM

## 2018-04-24 DIAGNOSIS — R6510 Systemic inflammatory response syndrome (SIRS) of non-infectious origin without acute organ dysfunction: Secondary | ICD-10-CM | POA: Diagnosis not present

## 2018-04-24 DIAGNOSIS — Z885 Allergy status to narcotic agent status: Secondary | ICD-10-CM | POA: Insufficient documentation

## 2018-04-24 DIAGNOSIS — I5032 Chronic diastolic (congestive) heart failure: Secondary | ICD-10-CM | POA: Insufficient documentation

## 2018-04-24 DIAGNOSIS — C785 Secondary malignant neoplasm of large intestine and rectum: Secondary | ICD-10-CM | POA: Diagnosis not present

## 2018-04-24 DIAGNOSIS — R160 Hepatomegaly, not elsewhere classified: Secondary | ICD-10-CM | POA: Insufficient documentation

## 2018-04-24 DIAGNOSIS — D61818 Other pancytopenia: Secondary | ICD-10-CM | POA: Diagnosis not present

## 2018-04-24 DIAGNOSIS — J45909 Unspecified asthma, uncomplicated: Secondary | ICD-10-CM | POA: Diagnosis not present

## 2018-04-24 DIAGNOSIS — Z86718 Personal history of other venous thrombosis and embolism: Secondary | ICD-10-CM | POA: Insufficient documentation

## 2018-04-24 DIAGNOSIS — R1011 Right upper quadrant pain: Secondary | ICD-10-CM | POA: Insufficient documentation

## 2018-04-24 DIAGNOSIS — Z170 Estrogen receptor positive status [ER+]: Secondary | ICD-10-CM | POA: Insufficient documentation

## 2018-04-24 DIAGNOSIS — R2681 Unsteadiness on feet: Secondary | ICD-10-CM | POA: Insufficient documentation

## 2018-04-24 DIAGNOSIS — G893 Neoplasm related pain (acute) (chronic): Secondary | ICD-10-CM | POA: Diagnosis not present

## 2018-04-24 DIAGNOSIS — Z789 Other specified health status: Secondary | ICD-10-CM | POA: Diagnosis present

## 2018-04-24 DIAGNOSIS — R05 Cough: Secondary | ICD-10-CM | POA: Diagnosis not present

## 2018-04-24 DIAGNOSIS — C50812 Malignant neoplasm of overlapping sites of left female breast: Secondary | ICD-10-CM | POA: Diagnosis not present

## 2018-04-24 DIAGNOSIS — R509 Fever, unspecified: Secondary | ICD-10-CM | POA: Diagnosis not present

## 2018-04-24 DIAGNOSIS — R110 Nausea: Secondary | ICD-10-CM | POA: Insufficient documentation

## 2018-04-24 DIAGNOSIS — K5900 Constipation, unspecified: Secondary | ICD-10-CM | POA: Diagnosis not present

## 2018-04-24 DIAGNOSIS — R300 Dysuria: Secondary | ICD-10-CM | POA: Insufficient documentation

## 2018-04-24 DIAGNOSIS — R0789 Other chest pain: Secondary | ICD-10-CM | POA: Diagnosis not present

## 2018-04-24 DIAGNOSIS — Z86711 Personal history of pulmonary embolism: Secondary | ICD-10-CM | POA: Insufficient documentation

## 2018-04-24 DIAGNOSIS — I2699 Other pulmonary embolism without acute cor pulmonale: Secondary | ICD-10-CM | POA: Diagnosis present

## 2018-04-24 DIAGNOSIS — I110 Hypertensive heart disease with heart failure: Secondary | ICD-10-CM | POA: Diagnosis not present

## 2018-04-24 DIAGNOSIS — Z803 Family history of malignant neoplasm of breast: Secondary | ICD-10-CM | POA: Insufficient documentation

## 2018-04-24 DIAGNOSIS — Z9012 Acquired absence of left breast and nipple: Secondary | ICD-10-CM | POA: Insufficient documentation

## 2018-04-24 DIAGNOSIS — Z91040 Latex allergy status: Secondary | ICD-10-CM | POA: Insufficient documentation

## 2018-04-24 DIAGNOSIS — Z87891 Personal history of nicotine dependence: Secondary | ICD-10-CM | POA: Insufficient documentation

## 2018-04-24 DIAGNOSIS — C50919 Malignant neoplasm of unspecified site of unspecified female breast: Secondary | ICD-10-CM | POA: Diagnosis present

## 2018-04-24 DIAGNOSIS — R1031 Right lower quadrant pain: Secondary | ICD-10-CM | POA: Diagnosis not present

## 2018-04-24 DIAGNOSIS — D630 Anemia in neoplastic disease: Secondary | ICD-10-CM | POA: Diagnosis not present

## 2018-04-24 DIAGNOSIS — Z888 Allergy status to other drugs, medicaments and biological substances status: Secondary | ICD-10-CM | POA: Insufficient documentation

## 2018-04-24 DIAGNOSIS — Z808 Family history of malignant neoplasm of other organs or systems: Secondary | ICD-10-CM | POA: Insufficient documentation

## 2018-04-24 DIAGNOSIS — Z800 Family history of malignant neoplasm of digestive organs: Secondary | ICD-10-CM | POA: Insufficient documentation

## 2018-04-24 DIAGNOSIS — R109 Unspecified abdominal pain: Secondary | ICD-10-CM

## 2018-04-24 DIAGNOSIS — C7951 Secondary malignant neoplasm of bone: Secondary | ICD-10-CM | POA: Diagnosis not present

## 2018-04-24 DIAGNOSIS — IMO0001 Reserved for inherently not codable concepts without codable children: Secondary | ICD-10-CM | POA: Diagnosis present

## 2018-04-24 NOTE — ED Notes (Signed)
Bed: WA21 Expected date:  Expected time:  Means of arrival:  Comments: 58 Fever flank pain

## 2018-04-24 NOTE — ED Triage Notes (Signed)
Per EMS,  Pt recently discharged on the 28. Was told she had calcium in the brain. Has had a fever and right flank pain since discharge. Was not able to get the medications she was prescribed upon discharge. Been taking tylenol for the fever last taken at 2100.

## 2018-04-24 NOTE — Telephone Encounter (Signed)
Received a phone call from patient's home nurse regarding her low-grade fever with temperature 100.4.  I called patient and spoke with her.  She had no chills, fever has resolved spontaneously.  She was discharged from hospital 2 days ago.  She had a similar episode of fever at the hospital, cultures were negative.  She complains of body aches, stable chronic cough with mild sputum production, no diarrhea or other new symptoms.  We discussed that cancer sometimes can cause fever 2.  I recommend her to watch for chills, fever, worsening productive cough or diarrhea, or UTI symptoms.  Will hold antibiotics for now.  She knows to call me if she has any new concerns.  I will schedule her next lab, follow-up and injections in a week.  She voiced good understanding and agrees with the plan.  [name]  04/24/2018

## 2018-04-25 ENCOUNTER — Emergency Department (HOSPITAL_COMMUNITY): Payer: Medicare Other

## 2018-04-25 ENCOUNTER — Encounter (HOSPITAL_COMMUNITY): Payer: Self-pay | Admitting: Internal Medicine

## 2018-04-25 DIAGNOSIS — D61818 Other pancytopenia: Secondary | ICD-10-CM | POA: Diagnosis not present

## 2018-04-25 DIAGNOSIS — D630 Anemia in neoplastic disease: Secondary | ICD-10-CM

## 2018-04-25 DIAGNOSIS — C7951 Secondary malignant neoplasm of bone: Secondary | ICD-10-CM | POA: Diagnosis not present

## 2018-04-25 DIAGNOSIS — Z170 Estrogen receptor positive status [ER+]: Secondary | ICD-10-CM

## 2018-04-25 DIAGNOSIS — I10 Essential (primary) hypertension: Secondary | ICD-10-CM

## 2018-04-25 DIAGNOSIS — R6510 Systemic inflammatory response syndrome (SIRS) of non-infectious origin without acute organ dysfunction: Secondary | ICD-10-CM | POA: Diagnosis not present

## 2018-04-25 DIAGNOSIS — C50919 Malignant neoplasm of unspecified site of unspecified female breast: Secondary | ICD-10-CM | POA: Diagnosis not present

## 2018-04-25 LAB — CULTURE, BLOOD (ROUTINE X 2)
Culture: NO GROWTH
Culture: NO GROWTH
Special Requests: ADEQUATE
Special Requests: ADEQUATE

## 2018-04-25 LAB — CBC WITH DIFFERENTIAL/PLATELET
Abs Immature Granulocytes: 0.24 10*3/uL — ABNORMAL HIGH (ref 0.00–0.07)
Basophils Absolute: 0 10*3/uL (ref 0.0–0.1)
Basophils Relative: 1 %
Eosinophils Absolute: 0.1 10*3/uL (ref 0.0–0.5)
Eosinophils Relative: 1 %
HCT: 15.6 % — ABNORMAL LOW (ref 36.0–46.0)
Hemoglobin: 4.5 g/dL — CL (ref 12.0–15.0)
IMMATURE GRANULOCYTES: 6 %
Lymphocytes Relative: 25 %
Lymphs Abs: 1 10*3/uL (ref 0.7–4.0)
MCH: 27.4 pg (ref 26.0–34.0)
MCHC: 28.8 g/dL — ABNORMAL LOW (ref 30.0–36.0)
MCV: 95.1 fL (ref 80.0–100.0)
MONOS PCT: 8 %
Monocytes Absolute: 0.3 10*3/uL (ref 0.1–1.0)
NEUTROS ABS: 2.3 10*3/uL (ref 1.7–7.7)
Neutrophils Relative %: 59 %
Platelets: 140 10*3/uL — ABNORMAL LOW (ref 150–400)
RBC: 1.64 MIL/uL — ABNORMAL LOW (ref 3.87–5.11)
RDW: 18.9 % — ABNORMAL HIGH (ref 11.5–15.5)
WBC: 3.9 10*3/uL — ABNORMAL LOW (ref 4.0–10.5)
nRBC: 6 % — ABNORMAL HIGH (ref 0.0–0.2)

## 2018-04-25 LAB — COMPREHENSIVE METABOLIC PANEL
ALBUMIN: 3.4 g/dL — AB (ref 3.5–5.0)
ALT: 20 U/L (ref 0–44)
AST: 43 U/L — ABNORMAL HIGH (ref 15–41)
Alkaline Phosphatase: 111 U/L (ref 38–126)
Anion gap: 7 (ref 5–15)
BUN: 12 mg/dL (ref 6–20)
CO2: 20 mmol/L — ABNORMAL LOW (ref 22–32)
Calcium: 8.5 mg/dL — ABNORMAL LOW (ref 8.9–10.3)
Chloride: 105 mmol/L (ref 98–111)
Creatinine, Ser: 0.87 mg/dL (ref 0.44–1.00)
GFR calc Af Amer: >60 mL/min (ref >60)
GFR calc non Af Amer: >60 mL/min (ref >60)
GLUCOSE: 111 mg/dL — AB (ref 70–99)
Potassium: 3.8 mmol/L (ref 3.5–5.1)
Sodium: 132 mmol/L — ABNORMAL LOW (ref 135–145)
Total Bilirubin: 1.2 mg/dL (ref 0.3–1.2)
Total Protein: 7.3 g/dL (ref 6.5–8.1)

## 2018-04-25 LAB — URINALYSIS, ROUTINE W REFLEX MICROSCOPIC
Bilirubin Urine: NEGATIVE
GLUCOSE, UA: 50 mg/dL — AB
Hgb urine dipstick: NEGATIVE
Ketones, ur: NEGATIVE mg/dL
Nitrite: NEGATIVE
Protein, ur: 30 mg/dL — AB
Specific Gravity, Urine: 1.014 (ref 1.005–1.030)
pH: 6 (ref 5.0–8.0)

## 2018-04-25 LAB — LACTIC ACID, PLASMA: Lactic Acid, Venous: 1.1 mmol/L (ref 0.5–1.9)

## 2018-04-25 MED ORDER — VITAMIN B-12 1000 MCG PO TABS
1000.0000 ug | ORAL_TABLET | Freq: Every day | ORAL | Status: DC
  Administered 2018-04-25 – 2018-04-28 (×4): 1000 ug via ORAL
  Filled 2018-04-25 (×4): qty 1

## 2018-04-25 MED ORDER — TRAMADOL HCL 50 MG PO TABS
100.0000 mg | ORAL_TABLET | Freq: Four times a day (QID) | ORAL | Status: DC | PRN
  Administered 2018-04-25: 50 mg via ORAL
  Filled 2018-04-25: qty 2

## 2018-04-25 MED ORDER — ONDANSETRON HCL 4 MG PO TABS: 4.0000 mg | ORAL_TABLET | Freq: Four times a day (QID) | ORAL | Status: DC | PRN

## 2018-04-25 MED ORDER — SODIUM CHLORIDE 0.9 % IV SOLN
1.0000 g | Freq: Once | INTRAVENOUS | Status: AC
  Administered 2018-04-25: 1 g via INTRAVENOUS
  Filled 2018-04-25: qty 10

## 2018-04-25 MED ORDER — ACETAMINOPHEN 500 MG PO TABS
1000.0000 mg | ORAL_TABLET | Freq: Once | ORAL | Status: AC
  Administered 2018-04-25: 1000 mg via ORAL
  Filled 2018-04-25: qty 2

## 2018-04-25 MED ORDER — HYDRALAZINE HCL 10 MG PO TABS
10.0000 mg | ORAL_TABLET | Freq: Three times a day (TID) | ORAL | Status: DC
  Administered 2018-04-25 – 2018-04-28 (×9): 10 mg via ORAL
  Filled 2018-04-25 (×10): qty 1

## 2018-04-25 MED ORDER — ACETAMINOPHEN 325 MG PO TABS
650.0000 mg | ORAL_TABLET | Freq: Four times a day (QID) | ORAL | Status: DC | PRN
  Administered 2018-04-25 – 2018-04-27 (×2): 650 mg via ORAL
  Filled 2018-04-25 (×2): qty 2

## 2018-04-25 MED ORDER — POLYETHYLENE GLYCOL 3350 17 G PO PACK
17.0000 g | PACK | Freq: Every day | ORAL | Status: DC
  Administered 2018-04-25 – 2018-04-27 (×3): 17 g via ORAL
  Filled 2018-04-25 (×4): qty 1

## 2018-04-25 MED ORDER — AMLODIPINE BESYLATE 10 MG PO TABS
10.0000 mg | ORAL_TABLET | Freq: Every day | ORAL | Status: DC
  Administered 2018-04-25 – 2018-04-28 (×3): 10 mg via ORAL
  Filled 2018-04-25 (×2): qty 1
  Filled 2018-04-25: qty 2

## 2018-04-25 MED ORDER — SODIUM CHLORIDE 0.9 % IV SOLN
1.0000 g | INTRAVENOUS | Status: DC
  Administered 2018-04-25 – 2018-04-27 (×3): 1 g via INTRAVENOUS
  Filled 2018-04-25 (×3): qty 1
  Filled 2018-04-25: qty 10

## 2018-04-25 MED ORDER — TRAMADOL HCL 50 MG PO TABS
50.0000 mg | ORAL_TABLET | Freq: Once | ORAL | Status: AC
  Administered 2018-04-25: 50 mg via ORAL
  Filled 2018-04-25: qty 1

## 2018-04-25 MED ORDER — FLUTICASONE PROPIONATE 50 MCG/ACT NA SUSP
1.0000 | Freq: Every day | NASAL | Status: DC
  Administered 2018-04-25 – 2018-04-28 (×4): 1 via NASAL
  Filled 2018-04-25: qty 16

## 2018-04-25 MED ORDER — ENOXAPARIN SODIUM 120 MG/0.8ML ~~LOC~~ SOLN
110.0000 mg | Freq: Two times a day (BID) | SUBCUTANEOUS | Status: DC
  Filled 2018-04-25 (×2): qty 0.73

## 2018-04-25 MED ORDER — FOLIC ACID 1 MG PO TABS
1.0000 mg | ORAL_TABLET | Freq: Every day | ORAL | Status: DC
  Administered 2018-04-25 – 2018-04-28 (×4): 1 mg via ORAL
  Filled 2018-04-25 (×4): qty 1

## 2018-04-25 MED ORDER — ENOXAPARIN SODIUM 120 MG/0.8ML ~~LOC~~ SOLN
110.0000 mg | Freq: Two times a day (BID) | SUBCUTANEOUS | Status: DC
  Administered 2018-04-25 – 2018-04-27 (×6): 110 mg via SUBCUTANEOUS
  Filled 2018-04-25 (×9): qty 0.73

## 2018-04-25 MED ORDER — ACETAMINOPHEN 650 MG RE SUPP: 650.0000 mg | Freq: Four times a day (QID) | RECTAL | Status: DC | PRN

## 2018-04-25 MED ORDER — IPRATROPIUM-ALBUTEROL 0.5-2.5 (3) MG/3ML IN SOLN: 3.0000 mL | Freq: Four times a day (QID) | RESPIRATORY_TRACT | Status: DC | PRN

## 2018-04-25 MED ORDER — LORATADINE 10 MG PO TABS
10.0000 mg | ORAL_TABLET | Freq: Every day | ORAL | Status: DC
  Administered 2018-04-25 – 2018-04-28 (×4): 10 mg via ORAL
  Filled 2018-04-25 (×4): qty 1

## 2018-04-25 MED ORDER — SODIUM CHLORIDE 0.9% FLUSH
3.0000 mL | Freq: Once | INTRAVENOUS | Status: AC
  Administered 2018-04-25: 3 mL via INTRAVENOUS

## 2018-04-25 MED ORDER — SODIUM CHLORIDE 0.9 % IV SOLN
INTRAVENOUS | Status: AC
  Administered 2018-04-25 (×2): via INTRAVENOUS

## 2018-04-25 MED ORDER — ONDANSETRON HCL 4 MG/2ML IJ SOLN: 4.0000 mg | Freq: Four times a day (QID) | INTRAMUSCULAR | Status: DC | PRN

## 2018-04-25 NOTE — ED Notes (Signed)
Date and time results received: 04/25/18 1249 (use smartphrase ".now" to insert current time)  Test: HGB Critical Value: 4.5  Name of Provider Notified: PA Mia  Orders Received? Or Actions Taken?: Orders Received - See Orders for details

## 2018-04-25 NOTE — ED Provider Notes (Signed)
[facility] DEPT Provider Note   CSN: [id number] Arrival date & time: 04/24/18  2341     History   Chief Complaint Chief Complaint  Patient presents with  . Flank Pain  . Fever    HPI Michaela Little is a 58 y.o. female Jehovah Witness with PMH for metastatic breast cancer to bone, appendix, and liver, chronic severe anemia, and right lower extremity DVT with recent PE who presents to the emergency department with a chief complaint of fever.  The patient endorses fever, T-max at home 101.4 yesterday.  Per chart review, the patient's home health nurse also called her oncologist yesterday afternoon after she was noted to have a temperature of 100.4.  At that time, the patient was having no chills and the fever resolved spontaneously.  The patient had also had one instance of a fever during her most recent admission, but cultures were negative.  She reports she developed chills tonight along with painful urination.  She also reports she has been feeling weak and sleeping most of the time since she was discharged from the hospital.  She denies hematuria, vaginal bleeding or discharge, abdominal pain, or rash.  She continues to endorse chronic pain in her bilateral flanks.  She states she is having achy pain that radiates from her chest down into her abdomen on the right side and sharp, shooting pain on the left.  She reports this has been going on since prior to her most recent admission.  She reports the pain is worse with movement.  No known alleviating factors.  She does report some improvement with her home tramadol.  The history is provided by the patient. No language interpreter was used.    Past Medical History:  Diagnosis Date  . Anxiety   . Arthritis    hands, knees, hips  . Asthma   . Bronchitis   . Cancer ([place])   . Cervical stenosis (uterine cervix)   . Disorder of appendix    Enlarged  . Dyspnea on exertion   . GERD (gastroesophageal  reflux disease)   . Headache(784.0)   . History of breast cancer    2010--  LEFT  s/p  mastectomy (in [place]) AND CHEMORADIATION--  NO RECURRENCE  . History of cervical dysplasia   . Hyperlipidemia   . Hypertension   . Malignant neoplasm of overlapping sites of left breast in female, estrogen receptor positive ([facility]) 03/05/2013  . OSA (obstructive sleep apnea) moderate osa per study  09/2010   CPAP  , NOT USING ON REGULAR BASIS  . Pelvic pain in female   . Positive H. pylori test    08-05-2013  . Positive TB test    AS TEEN--  [place] W/ MEDS  . Refusal of blood transfusions as patient is Jehovah's Witness   . Seasonal allergies   . Uterine fibroid   . Wears glasses     Patient Active Problem List   Diagnosis Date Noted  . SIRS (systemic inflammatory response syndrome) ([place]) 04/25/2018  . Hypercalcemia 04/18/2018  . Metastatic breast cancer ([place])   . Pulmonary embolism ([place]) 02/27/2018  . Acute respiratory failure with hypoxia ([place]) 01/04/2018  . Symptomatic anemia 01/02/2018  . Pancytopenia ([place]) 01/02/2018  . Patient is Jehovah's Witness   . Refusal of blood transfusions as patient is Jehovah's Witness   . High risk medication use 09/25/2017  . Lower extremity edema 09/25/2017  . S/P laparoscopic appendectomy 08/22/2017  . Goals of care, counseling/discussion 01/20/2017  .  Multinodular thyroid 02/08/2016  . Lumbar back pain with radiculopathy affecting left lower extremity 02/08/2016  . Carcinoma of breast metastatic to bone ([place]) 02/08/2016  . Bone metastases ([place]) 11/13/2015  . Right thyroid nodule 06/30/2014  . Allergic rhinitis due to pollen 06/30/2014  . Essential hypertension 12/24/2013  . Left ventricular diastolic dysfunction with preserved systolic function 97/67/3419  . Lump in neck 08/11/2013  . Abdominal pain, chronic, bilateral lower quadrant 08/05/2013  . Abdominal pain, epigastric 08/05/2013  . Morbid (severe) obesity due to excess calories ([place]) 06/23/2013  .  GERD (gastroesophageal reflux disease) 03/25/2013  . Constipation 03/25/2013  . Other and unspecified hyperlipidemia 03/25/2013  . Dysuria 03/25/2013  . Routine general medical examination at a health care facility 03/25/2013  . Malignant neoplasm of overlapping sites of left breast in female, estrogen receptor positive ([place]) 03/05/2013  . Cough 10/28/2012  . Dyspnea on exertion 10/28/2012  . Chronic abdominal pain 09/02/2012  . Hyperlipidemia 11/13/2011  . Dysplasia of cervix, low grade ([facility]) 08/22/2011  . Diastolic dysfunction 37/90/2409  . Influenza 03/04/2011  . Obesity 03/04/2011  . OSA (obstructive sleep apnea) 02/27/2011  . Asthma 02/08/2011    Past Surgical History:  Procedure Laterality Date  . APPENDECTOMY  y  . CARDIOVASCULAR STRESS TEST  10-29-2012   low risk perfusion study/  no significant reversibity/ ef 66%/  normal wall motion  . CERVICAL CONIZATION W/BX  2012   in  [place] N/A 05/14/2012   Procedure: LAPAROSCOPIC CHOLECYSTECTOMY;  Surgeon: Ralene Ok, MD;  Location: [facility];  Service: General;  Laterality: N/A;  . COLONOSCOPY  2011   normal per patient - [place]  . DILATION AND CURETTAGE OF UTERUS  10/15/2011   Procedure: DILATATION AND CURETTAGE;  Surgeon: Melina Schools, MD;  Location: [facility];  Service: Gynecology;  Laterality: N/A;  Conization &  endocervical curettings  . EXAMINATION UNDER ANESTHESIA N/A 08/20/2013   Procedure: EXAM UNDER ANESTHESIA;  Surgeon: Margarette Asal, MD;  Location: [facility];  Service: Gynecology;  Laterality: N/A;  . [place]   right  . IR IVC FILTER PLMT / S&I /IMG GUID/MOD SED  03/01/2018  . IR RADIOLOGIST EVAL & MGMT  04/08/2018  . LAPAROSCOPIC APPENDECTOMY N/A 08/22/2017   Procedure: [place];  Surgeon: Coralie Keens, MD;  Location: [facility];  Service: General;  Laterality: N/A;  . LAPAROSCOPIC ASSISTED VAGINAL HYSTERECTOMY N/A 10/26/2014   Procedure:  HYSTERECTOMY ABDOMINAL ;  Surgeon: Molli Posey, MD;  Location: [facility];  Service: Gynecology;  Laterality: N/A;  . MASTECTOMY Left 11/2008  in [place]   . SALPINGOOPHORECTOMY Bilateral 10/26/2014   Procedure: BILATERAL SALPINGO OOPHORECTOMY;  Surgeon: Molli Posey, MD;  Location: [facility];  Service: Gynecology;  Laterality: Bilateral;  . TISSUE EXPANDER PLACEMENT Left 08/22/2017   Procedure: REMOVAL OF LEFT TISSUE EXPANDER;  Surgeon: Crissie Reese, MD;  Location: [facility];  Service: Plastics;  Laterality: Left;  . TISSUE EXPANDER REMOVAL Left 08/22/2017  . TRANSTHORACIC ECHOCARDIOGRAM  10-29-2012   mild lvh/  ef 60-65%/  grade II diastolic dysfunction/  trivial mr  &  tr     OB History    Gravida  10   Para  5   Term  5   Preterm      AB  5   Living  5     SAB  3   TAB  2   Ectopic  Multiple      Live Births               Home Medications    Prior to Admission medications   Medication Sig Start Date End Date Taking? Authorizing Provider  acetaminophen (TYLENOL) 500 MG tablet Take 500 mg by mouth every 6 (six) hours as needed for mild pain.   Yes [provider]  amLODipine (NORVASC) 10 MG tablet Take 1 tablet (10 mg total) by mouth daily. 04/22/18  Yes Kayleen Memos, DO  cetirizine (ZYRTEC) 10 MG tablet Take 10 mg by mouth daily. 12/19/17  Yes [provider]  enoxaparin (LOVENOX) 120 MG/0.8ML injection Inject 0.73 mLs (110 mg total) into the skin every 12 (twelve) hours. 03/21/18 04/25/18 Yes [name], MD  fluticasone furoate-vilanterol (BREO ELLIPTA) 100-25 MCG/INH AEPB Inhale 1 puff into the lungs daily. Patient taking differently: Inhale 1 puff into the lungs daily as needed (sob and wheezing).  10/18/17  Yes Martyn Ehrich, NP  folic acid (FOLVITE) 1 MG tablet Take 1 tablet (1 mg total) by mouth daily. 01/05/18  Yes Eulogio Bear U, DO  hydrALAZINE (APRESOLINE) 10 MG tablet Take 1 tablet (10 mg total) by mouth 3 (three)  times daily. 04/22/18  Yes Hall, Carole N, DO  ipratropium-albuterol (DUONEB) 0.5-2.5 (3) MG/3ML SOLN INHALE THREE MLS VIA NEBULIZER EVERY 6 HOURS AS NEEDED Patient taking differently: Take 3 mLs by nebulization every 6 (six) hours as needed (for wheezing or shortness of breath).  02/13/18  Yes Clent Demark, PA-C  Multiple Vitamins-Minerals (MULTIVITAMIN ADULT PO) Take 1 tablet by mouth daily.   Yes [provider]  polyethylene glycol (MIRALAX / GLYCOLAX) packet Take 17 g by mouth daily. 04/22/18  Yes Kayleen Memos, DO  traMADol (ULTRAM) 50 MG tablet Take 2 tablets (100 mg total) by mouth every 6 (six) hours. Patient taking differently: Take 100 mg by mouth every 6 (six) hours as needed for moderate pain or severe pain.  03/08/18  Yes Mikhail, Lincoln, DO  vitamin B-12 1000 MCG tablet Take 1 tablet (1,000 mcg total) by mouth daily. 01/05/18  Yes Geradine Girt, DO    Family History Family History  Problem Relation Age of Onset  . Breast cancer Mother   . Colon cancer Mother   . Hypotension Mother   . Asthma Mother   . Diabetes type II Mother   . Arthritis Mother   . Clotting disorder Mother   . Cancer Mother        breast/colon  . Mental illness Brother   . Heart disease Brother   . Cerebral palsy Daughter   . Emphysema Brother        never smoker  . Colon cancer Maternal Aunt   . Cancer Maternal Aunt        colon  . Colon cancer Maternal Uncle   . Cancer Maternal Uncle        colon  . Esophageal cancer Neg Hx   . Rectal cancer Neg Hx   . Stomach cancer Neg Hx   . Thyroid disease Neg Hx     Social History Social History   Tobacco Use  . Smoking status: Former Smoker    Packs/day: 0.30    Years: 10.00    Pack years: 3.00    Types: Cigarettes    Last attempt to quit: 03/26/1988    Years since quitting: 30.1  . Smokeless tobacco: Never Used  Substance Use Topics  . Alcohol  use: No  . Drug use: No     Allergies   Lisinopril-hydrochlorothiazide;  Adhesive [tape]; Fentanyl; Gabapentin; Hctz [hydrochlorothiazide]; Latex; Lisinopril; Losartan potassium; Other; Oxycodone; and Tums [calcium carbonate antacid]   Review of Systems Review of Systems  Constitutional: Positive for chills, fatigue and fever. Negative for activity change.  HENT: Negative for congestion.   Respiratory: Negative for shortness of breath.   Cardiovascular: Positive for chest pain (chronic).  Gastrointestinal: Negative for abdominal pain, diarrhea, nausea and vomiting.  Genitourinary: Positive for dysuria and flank pain (chronic).  Musculoskeletal: Negative for back pain.  Skin: Negative for rash.  Allergic/Immunologic: Negative for immunocompromised state.  Neurological: Positive for weakness. Negative for headaches.  Psychiatric/Behavioral: Negative for confusion.     Physical Exam Updated Vital Signs BP (!) 119/54 (BP Location: Right Arm)   Pulse 95   Temp (!) 100.5 F (38.1 C) (Rectal)   Resp 16   LMP 02/25/2009   SpO2 100%   Physical Exam Vitals signs and nursing note reviewed.  Constitutional:      General: She is not in acute distress.    Appearance: She is ill-appearing. She is not toxic-appearing or diaphoretic.  HENT:     Head: Normocephalic.  Eyes:     Conjunctiva/sclera: Conjunctivae normal.  Neck:     Musculoskeletal: Neck supple.  Cardiovascular:     Rate and Rhythm: Normal rate and regular rhythm.     Heart sounds: No murmur. No friction rub. No gallop.   Pulmonary:     Effort: Pulmonary effort is normal. No respiratory distress.     Breath sounds: No stridor. No wheezing, rhonchi or rales.  Chest:     Chest wall: No tenderness.  Abdominal:     General: There is no distension.     Palpations: Abdomen is soft. There is no mass.     Tenderness: There is abdominal tenderness. There is no right CVA tenderness, left CVA tenderness, guarding or rebound.     Hernia: No hernia is present.     Comments: Generalized tenderness  throughout the abdomen.  No focal tenderness.  Skin:    General: Skin is warm.     Findings: No rash.  Neurological:     Mental Status: She is alert.  Psychiatric:        Behavior: Behavior normal.    ED Treatments / Results  Labs (all labs ordered are listed, but only abnormal results are displayed) Labs Reviewed  COMPREHENSIVE METABOLIC PANEL - Abnormal; Notable for the following components:      Result Value   Sodium 132 (*)    CO2 20 (*)    Glucose, Bld 111 (*)    Calcium 8.5 (*)    Albumin 3.4 (*)    AST 43 (*)    All other components within normal limits  CBC WITH DIFFERENTIAL/PLATELET - Abnormal; Notable for the following components:   WBC 3.9 (*)    RBC 1.64 (*)    Hemoglobin 4.5 (*)    HCT 15.6 (*)    MCHC 28.8 (*)    RDW 18.9 (*)    Platelets 140 (*)    nRBC 6.0 (*)    Abs Immature Granulocytes 0.24 (*)    All other components within normal limits  URINALYSIS, ROUTINE W REFLEX MICROSCOPIC - Abnormal; Notable for the following components:   Glucose, UA 50 (*)    Protein, ur 30 (*)    Leukocytes, UA TRACE (*)    Bacteria, UA RARE (*)  All other components within normal limits  CULTURE, BLOOD (ROUTINE X 2)  CULTURE, BLOOD (ROUTINE X 2)  URINE CULTURE  LACTIC ACID, PLASMA    EKG None  Radiology Dg Chest 2 View  Result Date: 04/25/2018 CLINICAL DATA:  Fever and right flank pain since discharge. History of breast cancer. EXAM: CHEST - 2 VIEW COMPARISON:  Chest CT 04/02/2018 FINDINGS: Osteolytic metastatic disease of the included dorsal spine, shoulders, manubrium and sternum. Top-normal heart size. Nonaneurysmal thoracic aorta. No acute pulmonary consolidation, edema, effusion or pneumothorax. Left axillary clips are present. IMPRESSION: 1. No active cardiopulmonary disease. 2. Diffuse metastatic disease to bone. Electronically Signed   By: Ashley Royalty M.D.   On: 04/25/2018 00:55    Procedures Procedures (including critical care time)  Medications  Ordered in ED Medications  traMADol (ULTRAM) tablet 100 mg (has no administration in time range)  amLODipine (NORVASC) tablet 10 mg (has no administration in time range)  hydrALAZINE (APRESOLINE) tablet 10 mg (has no administration in time range)  polyethylene glycol (MIRALAX / GLYCOLAX) packet 17 g (has no administration in time range)  enoxaparin (LOVENOX) injection 110 mg (has no administration in time range)  folic acid (FOLVITE) tablet 1 mg (has no administration in time range)  vitamin B-12 (CYANOCOBALAMIN) tablet 1,000 mcg (has no administration in time range)  loratadine (CLARITIN) tablet 10 mg (has no administration in time range)  ipratropium-albuterol (DUONEB) 0.5-2.5 (3) MG/3ML nebulizer solution 3 mL (has no administration in time range)  acetaminophen (TYLENOL) tablet 650 mg (has no administration in time range)    Or  acetaminophen (TYLENOL) suppository 650 mg (has no administration in time range)  ondansetron (ZOFRAN) tablet 4 mg (has no administration in time range)    Or  ondansetron (ZOFRAN) injection 4 mg (has no administration in time range)  0.9 %  sodium chloride infusion ( Intravenous New Bag/Given 04/25/18 0615)  cefTRIAXone (ROCEPHIN) 1 g in sodium chloride 0.9 % 100 mL IVPB (has no administration in time range)  sodium chloride flush (NS) 0.9 % injection 3 mL (3 mLs Intravenous Given 04/25/18 0313)  acetaminophen (TYLENOL) tablet 1,000 mg (1,000 mg Oral Given 04/25/18 0424)  traMADol (ULTRAM) tablet 50 mg (50 mg Oral Given 04/25/18 0424)  cefTRIAXone (ROCEPHIN) 1 g in sodium chloride 0.9 % 100 mL IVPB (0 g Intravenous Stopped 04/25/18 0646)     Initial Impression / Assessment and Plan / ED Course  I have reviewed the triage vital signs and the nursing notes.  Pertinent labs & imaging results that were available during my care of the patient were reviewed by me and considered in my medical decision making (see chart for details).     58 year old female Jehovah  Witness with PMH for metastatic breast cancer to bone, appendix, and liver, chronic severe anemia, and right lower extremity DVT with recent PE presenting with fever, chills, and dysuria since she was discharged on January 28.  On arrival, she was tachycardic in the 110s.  Febrile to 100.5.  Tylenol given.  Urine is concerning for infection.  Culture sent.  Rocephin given in the ED.  Blood cultures drawn.  Hemoglobin is 4.5, decreased from her recent admission, but the patient refuses blood products.  Labs are otherwise unchanged from previous.  Spoke with Dr. Maylon Peppers, college he, he request to be added to the patient's admission team to have Dr. Dr. Burr Medico follow with the patient. Consult to the hospitalist team and spoke with Dr. Hal Hope who will accept the patient for  admission.  Final Clinical Impressions(s) / ED Diagnoses   Final diagnoses:  Acute cystitis without hematuria  SIRS (systemic inflammatory response syndrome) [facility])    ED Discharge Orders    None       Joanne Gavel, PA-C 04/25/18 1224    Rolland Porter, MD 04/25/18 [id number]

## 2018-04-25 NOTE — Progress Notes (Signed)
Michaela Little   DOB:Mar 02, 1961   NI#:[id number]   [id number]  Oncology f/u   Subjective: Patient is well-known to me, under my care for her metastatic breast cancer.  She presented to the hospital yesterday with fever and worsening body pain.  She is very fatigued, has chronic dry cough, still has low-grade fever today.   Objective:  Vitals:   04/25/18 1632 04/25/18 1647  BP: 136/71 123/73  Pulse: 92 95  Resp: 17 18  Temp:  (!) 100.4 F (38 C)  SpO2: 100% 99%    Body mass index is 35.63 kg/m.  Intake/Output Summary (Last 24 hours) at 04/25/2018 1948 Last data filed at 04/25/2018 1716 Gross per 24 hour  Intake 921.67 ml  Output -  Net 921.67 ml     Sclerae unicteric, appears pale and chronically ill  Oropharynx clear  No peripheral adenopathy  Lungs clear -- no rales or rhonchi  Heart regular rate and rhythm  Abdomen benign  MSK no focal spinal tenderness, no peripheral edema  Neuro nonfocal   CBG (last 3)  No results for input(s): GLUCAP in the last 72 hours.   Labs:  Lab Results  Component Value Date   WBC 3.9 (L) 04/25/2018   HGB 4.5 (LL) 04/25/2018   HCT 15.6 (L) 04/25/2018   MCV 95.1 04/25/2018   PLT 140 (L) 04/25/2018   NEUTROABS 2.3 04/25/2018   CMP Latest Ref Rng & Units 04/25/2018 04/22/2018 04/21/2018  Glucose 70 - 99 mg/dL 111(H) 108(H) 127(H)  BUN 6 - 20 mg/dL _0 Creatinine 0.44 - 1.00 mg/dL 0.87 0.97 1.17(H)  Sodium 135 - 145 mmol/L 132(L) 135 133(L)  Potassium 3.5 - 5.1 mmol/L 3.8 3.2(L) 3.5  Chloride 98 - 111 mmol/L 105 104 103  CO2 22 - 32 mmol/L 20(L) 21(L) 22  Calcium 8.9 - 10.3 mg/dL 8.5(L) 8.5(L) 9.0  Total Protein 6.5 - 8.1 g/dL 7.3 6.9 6.6  Total Bilirubin 0.3 - 1.2 mg/dL 1.2 0.9 1.1  Alkaline Phos 38 - 126 U/L 111 79 74  AST 15 - 41 U/L 43(H) 37 38  ALT 0 - 44 U/L _1 Urine Studies No results for input(s): UHGB, CRYS in the last 72 hours.  Invalid input(s): UACOL, UAPR, USPG, UPH, UTP, UGL, UKET, UBIL, UNIT,  UROB, [place], UEPI, UWBC, Duwayne Heck [place], [place]  Basic Metabolic Panel: Recent Labs  Lab 04/19/18 1237 04/20/18 0322 04/21/18 0310 04/22/18 0314 04/25/18 0017  NA 144 138 133* 135 132*  K 3.0* 3.2* 3.5 3.2* 3.8  CL 107 102 103 104 105  CO2 _2 21* 20*  GLUCOSE 137* 123* 127* 108* 111*  BUN _3 CREATININE 1.54* 1.65* 1.17* 0.97 0.87  CALCIUM 14.5* 11.3* 9.0 8.5* 8.5*   GFR Estimated Creatinine Clearance: 84.9 mL/min (by C-G formula based on SCr of 0.87 mg/dL). Liver Function Tests: Recent Labs  Lab 04/19/18 1237 04/20/18 0322 04/21/18 0310 04/22/18 0314 04/25/18 0017  AST 32 31 38 37 43*  ALT _4 ALKPHOS 94 84 74 79 111  BILITOT 1.2 1.3* 1.1 0.9 1.2  PROT 7.3 7.1 6.6 6.9 7.3  ALBUMIN 3.8 3.5 3.2* 3.1* 3.4*   No results for input(s): LIPASE, AMYLASE in the last 168 hours. No results for input(s): AMMONIA in the last 168 hours. Coagulation profile No results for input(s): INR, PROTIME in the last 168 hours.  CBC: Recent Labs  Lab 04/19/18 0546 04/20/18 0322 04/25/18 0017  WBC 3.5* 3.1* 3.9*  NEUTROABS  --   --  2.3  HGB 5.1* 5.1* 4.5*  HCT 17.9* 18.0* 15.6*  MCV 96.8 98.4 95.1  PLT 126* 98* 140*   Cardiac Enzymes: Recent Labs  Lab 04/22/18 0825  TROPONINI <0.03   BNP: Invalid input(s): POCBNP CBG: Recent Labs  Lab 04/19/18 1351  GLUCAP 119*   D-Dimer No results for input(s): DDIMER in the last 72 hours. Hgb A1c No results for input(s): HGBA1C in the last 72 hours. Lipid Profile No results for input(s): CHOL, HDL, LDLCALC, TRIG, CHOLHDL, LDLDIRECT in the last 72 hours. Thyroid function studies No results for input(s): TSH, T4TOTAL, T3FREE, THYROIDAB in the last 72 hours.  Invalid input(s): FREET3 Anemia work up No results for input(s): VITAMINB12, FOLATE, FERRITIN, TIBC, IRON, RETICCTPCT in the last 72 hours. Microbiology Recent Results (from the past 240 hour(s))  Culture, Urine     Status: Abnormal    Collection Time: 04/18/18  6:00 PM  Result Value Ref Range Status   Specimen Description   Final    URINE, CATHETERIZED Performed at [facility], [place] [address], [place], [place] [address]    Special Requests   Final    Immunocompromised Performed at [facility], [place] [address], [place], [place] [address]    Culture MULTIPLE SPECIES PRESENT, SUGGEST RECOLLECTION (A)  Final   Report Status 04/21/2018 FINAL  Final  Culture, blood (routine x 2)     Status: None   Collection Time: 04/20/18 12:46 AM  Result Value Ref Range Status   Specimen Description   Final    BLOOD RIGHT ANTECUBITAL Performed at [facility] [address], [place], [place] [address]    Special Requests   Final    BOTTLES DRAWN AEROBIC ONLY Blood Culture adequate volume Performed at [facility] [address] [place], [place] [address]    Culture   Final    NO GROWTH 5 DAYS Performed at [facility], [place] [address] [place], [place] [address]    Report Status 04/25/2018 FINAL  Final  Culture, blood (routine x 2)     Status: None   Collection Time: 04/20/18 12:46 AM  Result Value Ref Range Status   Specimen Description   Final    BLOOD RIGHT HAND Performed at [facility] [address], [place], [place] [address]    Special Requests   Final    BOTTLES DRAWN AEROBIC ONLY Blood Culture adequate volume Performed at [facility] [address] [place], [place] [address]    Culture   Final    NO GROWTH 5 DAYS Performed at [facility], [place] [address] [place], [place] [address]    Report Status 04/25/2018 FINAL  Final      Studies:  Dg Chest 2 View  Result Date: 04/25/2018 CLINICAL DATA:  Fever and right flank pain since discharge. History of breast cancer. EXAM: CHEST - 2 VIEW COMPARISON:  Chest CT 04/02/2018 FINDINGS: Osteolytic metastatic disease of the included dorsal  spine, shoulders, manubrium and sternum. Top-normal heart size. Nonaneurysmal thoracic aorta. No acute pulmonary consolidation, edema, effusion or pneumothorax. Left axillary clips are present. IMPRESSION: 1. No active cardiopulmonary disease. 2. Diffuse metastatic disease to bone. Electronically Signed   By: Ashley Royalty M.D.   On: 04/25/2018 00:55    Assessment: 59 y.o. with diffuse metastatic breast cancer, severe  anemia secondary to bone metastasis, Jehovah witness, presented with fever and worsening body pain.  1. Fever, ? UTI vs tumor fever  2. Body pain secondary to bone mets  3.  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  Severe anemia, secondary to bone metastasis, Jehovah witness 5. History of right LE DVT and PE, on lovenox  6. Cough and dyspnea  7. Recently history of hypercalcemia and AKI, resolved  8. Deconditioning   Plan:  -I agree with antibiotics treatment while waiting for culture -continue Lovenox 176m q12h  -continue tramadol, may need low dose narcotics for her worsening body pain -unfortunately she has been deteriorating due to her cancer progression, and she has been declining cancer treatment. She recently agreed with fulvestrant injection, plan to start next week. -Please minimize her blood draw due to her severe anemia and being Jehovah's Witness -code status and hospice previously discussed with pt, she is not ready to enroll hospice yet.  -will f/u    YTruitt Merle MD 04/25/2018  7:48 PM

## 2018-04-25 NOTE — H&P (Signed)
History and Physical    Michaela Little ZWC:[id number] DOB: Nov 13, 1960 DOA: 04/24/2018  PCP: Ladell Pier, MD  Patient coming from: Home.  Chief Complaint: Fever chills.  HPI: Michaela Little is a 58 y.o. female with history of metastatic breast cancer, diastolic CHF, hypertension, anemia and pancytopenia Jehovah's Witness presents to the ER after patient has been having subjective feeling of fever chills last 3 days.  Patient was just discharged 3 days ago after being admitted for hypercalcemia at that time patient was treated with IV bisphosphonate calcitonin Lasix.  Patient's calcium improved at discharge time and but states he has she has been developing some fever and dysuria for the last 3 days.  Denies any vomiting abdominal pain diarrhea.  Has some nausea.  Has some shortness of which is chronic but denies any productive cough or wheezing.  ED Course: In the ER patient had a temperature of 100.1 with UA is compatible with UTI.  Given the patient's generalized weakness with nausea fever hemoglobin further decreasing to 4.5 with Jehovah's Witness admitted for further observation and treating for SIRS given that patient was tachycardic febrile with leukopenia secondary to possible UTI.  Review of Systems: As per HPI, rest all negative.   Past Medical History:  Diagnosis Date  . Anxiety   . Arthritis    hands, knees, hips  . Asthma   . Bronchitis   . Cancer ([place])   . Cervical stenosis (uterine cervix)   . Disorder of appendix    Enlarged  . Dyspnea on exertion   . GERD (gastroesophageal reflux disease)   . Headache(784.0)   . History of breast cancer    2010--  LEFT  s/p  mastectomy (in [place]) AND CHEMORADIATION--  NO RECURRENCE  . History of cervical dysplasia   . Hyperlipidemia   . Hypertension   . Malignant neoplasm of overlapping sites of left breast in female, estrogen receptor positive ([place]) 03/05/2013  . OSA (obstructive sleep apnea) moderate osa per study  09/2010     CPAP  , NOT USING ON REGULAR BASIS  . Pelvic pain in female   . Positive H. pylori test    08-05-2013  . Positive TB test    AS TEEN--  [place] W/ MEDS  . Refusal of blood transfusions as patient is Jehovah's Witness   . Seasonal allergies   . Uterine fibroid   . Wears glasses     Past Surgical History:  Procedure Laterality Date  . APPENDECTOMY  y  . CARDIOVASCULAR STRESS TEST  10-29-2012   low risk perfusion study/  no significant reversibity/ ef 66%/  normal wall motion  . CERVICAL CONIZATION W/BX  2012   in  [place] N/A 05/14/2012   Procedure: LAPAROSCOPIC CHOLECYSTECTOMY;  Surgeon: Ralene Ok, MD;  Location: [facility];  Service: General;  Laterality: N/A;  . COLONOSCOPY  2011   normal per patient - [place]  . DILATION AND CURETTAGE OF UTERUS  10/15/2011   Procedure: DILATATION AND CURETTAGE;  Surgeon: Melina Schools, MD;  Location: [facility];  Service: Gynecology;  Laterality: N/A;  Conization &  endocervical curettings  . EXAMINATION UNDER ANESTHESIA N/A 08/20/2013   Procedure: EXAM UNDER ANESTHESIA;  Surgeon: Margarette Asal, MD;  Location: [facility];  Service: Gynecology;  Laterality: N/A;  . [place]   right  . IR IVC FILTER PLMT / S&I /IMG GUID/MOD SED  03/01/2018  . IR RADIOLOGIST EVAL & MGMT  04/08/2018  . LAPAROSCOPIC APPENDECTOMY N/A 08/22/2017   Procedure: [place];  Surgeon: Coralie Keens, MD;  Location: [facility];  Service: General;  Laterality: N/A;  . LAPAROSCOPIC ASSISTED VAGINAL HYSTERECTOMY N/A 10/26/2014   Procedure: HYSTERECTOMY ABDOMINAL ;  Surgeon: Molli Posey, MD;  Location: [facility];  Service: Gynecology;  Laterality: N/A;  . MASTECTOMY Left 11/2008  in [place]   . SALPINGOOPHORECTOMY Bilateral 10/26/2014   Procedure: BILATERAL SALPINGO OOPHORECTOMY;  Surgeon: Molli Posey, MD;  Location: [facility];  Service: Gynecology;  Laterality: Bilateral;  . TISSUE EXPANDER PLACEMENT Left  08/22/2017   Procedure: REMOVAL OF LEFT TISSUE EXPANDER;  Surgeon: Crissie Reese, MD;  Location: [facility];  Service: Plastics;  Laterality: Left;  . TISSUE EXPANDER REMOVAL Left 08/22/2017  . TRANSTHORACIC ECHOCARDIOGRAM  10-29-2012   mild lvh/  ef 60-65%/  grade II diastolic dysfunction/  trivial mr  &  tr     reports that she quit smoking about 30 years ago. Her smoking use included cigarettes. She has a 3.00 pack-year smoking history. She has never used smokeless tobacco. She reports that she does not drink alcohol or use drugs.  Allergies  Allergen Reactions  . Lisinopril-Hydrochlorothiazide Itching  . Adhesive [Tape] Itching and Other (See Comments)    Redness  . Fentanyl Other (See Comments)    GI upset and drowsiness. Only to [facility]  . Gabapentin Other (See Comments)    Auditory hallucinations  . Hctz [Hydrochlorothiazide] Itching  . Latex Itching  . Lisinopril Itching  . Losartan Potassium Other (See Comments)    Makes her feel "bad"   . Other Other (See Comments)    Patient refuses blood for religious reasons (per her notes)  . Oxycodone     GI upset, drowsy  . Tums [Calcium Carbonate Antacid] Hives    FRUIT-FLAVORED ONES    Family History  Problem Relation Age of Onset  . Breast cancer Mother   . Colon cancer Mother   . Hypotension Mother   . Asthma Mother   . Diabetes type II Mother   . Arthritis Mother   . Clotting disorder Mother   . Cancer Mother        breast/colon  . Mental illness Brother   . Heart disease Brother   . Cerebral palsy Daughter   . Emphysema Brother        never smoker  . Colon cancer Maternal Aunt   . Cancer Maternal Aunt        colon  . Colon cancer Maternal Uncle   . Cancer Maternal Uncle        colon  . Esophageal cancer Neg Hx   . Rectal cancer Neg Hx   . Stomach cancer Neg Hx   . Thyroid disease Neg Hx     Prior to Admission medications   Medication Sig Start Date End Date Taking? Authorizing Provider    acetaminophen (TYLENOL) 500 MG tablet Take 500 mg by mouth every 6 (six) hours as needed for mild pain.   Yes [provider]  amLODipine (NORVASC) 10 MG tablet Take 1 tablet (10 mg total) by mouth daily. 04/22/18  Yes Kayleen Memos, DO  cetirizine (ZYRTEC) 10 MG tablet Take 10 mg by mouth daily. 12/19/17  Yes [provider]  enoxaparin (LOVENOX) 120 MG/0.8ML injection Inject 0.73 mLs (110 mg total) into the skin every 12 (twelve) hours. 03/21/18 04/25/18 Yes [name], MD  fluticasone furoate-vilanterol (BREO ELLIPTA)  100-25 MCG/INH AEPB Inhale 1 puff into the lungs daily. Patient taking differently: Inhale 1 puff into the lungs daily as needed (sob and wheezing).  10/18/17  Yes Martyn Ehrich, NP  folic acid (FOLVITE) 1 MG tablet Take 1 tablet (1 mg total) by mouth daily. 01/05/18  Yes Eulogio Bear U, DO  hydrALAZINE (APRESOLINE) 10 MG tablet Take 1 tablet (10 mg total) by mouth 3 (three) times daily. 04/22/18  Yes Hall, Carole N, DO  ipratropium-albuterol (DUONEB) 0.5-2.5 (3) MG/3ML SOLN INHALE THREE MLS VIA NEBULIZER EVERY 6 HOURS AS NEEDED Patient taking differently: Take 3 mLs by nebulization every 6 (six) hours as needed (for wheezing or shortness of breath).  02/13/18  Yes Clent Demark, PA-C  Multiple Vitamins-Minerals (MULTIVITAMIN ADULT PO) Take 1 tablet by mouth daily.   Yes [provider]  polyethylene glycol (MIRALAX / GLYCOLAX) packet Take 17 g by mouth daily. 04/22/18  Yes Kayleen Memos, DO  traMADol (ULTRAM) 50 MG tablet Take 2 tablets (100 mg total) by mouth every 6 (six) hours. Patient taking differently: Take 100 mg by mouth every 6 (six) hours as needed for moderate pain or severe pain.  03/08/18  Yes Mikhail, Dyess, DO  vitamin B-12 1000 MCG tablet Take 1 tablet (1,000 mcg total) by mouth daily. 01/05/18  Yes Geradine Girt, DO    Physical Exam: Vitals:   04/24/18 2351 04/25/18 0200 04/25/18 0415 04/25/18 0420  BP: 118/68 134/65 (!)  115/57   Pulse: (!) 116 96 86   Resp: (!) 24 (!) 22 20   Temp: 100 F (37.8 C)   (!) 100.5 F (38.1 C)  TempSrc:    Rectal  SpO2: 100% 99% 100%       Constitutional: Moderately built and nourished. Vitals:   04/24/18 2351 04/25/18 0200 04/25/18 0415 04/25/18 0420  BP: 118/68 134/65 (!) 115/57   Pulse: (!) 116 96 86   Resp: (!) 24 (!) 22 20   Temp: 100 F (37.8 C)   (!) 100.5 F (38.1 C)  TempSrc:    Rectal  SpO2: 100% 99% 100%    Eyes: Anicteric no pallor. ENMT: No discharge from the ears eyes nose or mouth. Neck: No mass felt.  No neck rigidity. Respiratory: No rhonchi or crepitations. Cardiovascular: S1-S2 heard. Abdomen: Soft nontender bowel sounds present. Musculoskeletal: No edema.  No joint effusion. Skin: No rash. Neurologic: Alert awake oriented to time place and person.  Moves all extremities. Psychiatric: Appears normal per normal affect.   Labs on Admission: I have personally reviewed following labs and imaging studies  CBC: Recent Labs  Lab 04/18/18 1423 04/18/18 1846 04/19/18 0546 04/20/18 0322 04/25/18 0017  WBC 3.8* 3.6* 3.5* 3.1* 3.9*  NEUTROABS 1.9  --   --   --  2.3  HGB 5.8* 5.2* 5.1* 5.1* 4.5*  HCT 19.9* 18.3* 17.9* 18.0* 15.6*  MCV 95.7 98.9 96.8 98.4 95.1  PLT 124* 119* 126* 98* 093*   Basic Metabolic Panel: Recent Labs  Lab 04/18/18 1846  04/19/18 1237 04/20/18 0322 04/21/18 0310 04/22/18 0314 04/25/18 0017  NA  --    < > 144 138 133* 135 132*  K  --    < > 3.0* 3.2* 3.5 3.2* 3.8  CL  --    < > 107 102 103 104 105  CO2  --    < > 29 27 22  21* 20*  GLUCOSE  --    < > 137* 123* 127* 108* 111*  BUN  --    < > 14 17 16 16 12   CREATININE  --    < > 1.54* 1.65* 1.17* 0.97 0.87  CALCIUM  --    < > 14.5* 11.3* 9.0 8.5* 8.5*  MG 2.0  --   --   --   --   --   --   PHOS 4.2  --   --   --   --   --   --    < > = values in this interval not displayed.   GFR: Estimated Creatinine Clearance: 84.9 mL/min (by C-G formula based on SCr of  0.87 mg/dL). Liver Function Tests: Recent Labs  Lab 04/19/18 1237 04/20/18 0322 04/21/18 0310 04/22/18 0314 04/25/18 0017  AST 32 31 38 37 43*  ALT 14 13 13 12 20   ALKPHOS 94 84 74 79 111  BILITOT 1.2 1.3* 1.1 0.9 1.2  PROT 7.3 7.1 6.6 6.9 7.3  ALBUMIN 3.8 3.5 3.2* 3.1* 3.4*   No results for input(s): LIPASE, AMYLASE in the last 168 hours. No results for input(s): AMMONIA in the last 168 hours. Coagulation Profile: No results for input(s): INR, PROTIME in the last 168 hours. Cardiac Enzymes: Recent Labs  Lab 04/22/18 0825  TROPONINI <0.03   BNP (last 3 results) No results for input(s): PROBNP in the last 8760 hours. HbA1C: No results for input(s): HGBA1C in the last 72 hours. CBG: Recent Labs  Lab 04/19/18 1351  GLUCAP 119*   Lipid Profile: No results for input(s): CHOL, HDL, LDLCALC, TRIG, CHOLHDL, LDLDIRECT in the last 72 hours. Thyroid Function Tests: No results for input(s): TSH, T4TOTAL, FREET4, T3FREE, THYROIDAB in the last 72 hours. Anemia Panel: No results for input(s): VITAMINB12, FOLATE, FERRITIN, TIBC, IRON, RETICCTPCT in the last 72 hours. Urine analysis:    Component Value Date/Time   COLORURINE YELLOW 04/25/2018 0412   APPEARANCEUR CLEAR 04/25/2018 0412   LABSPEC 1.014 04/25/2018 0412   LABSPEC 1.025 01/11/2016 1233   PHURINE 6.0 04/25/2018 0412   GLUCOSEU 50 (A) 04/25/2018 0412   GLUCOSEU Negative 01/11/2016 1233   HGBUR NEGATIVE 04/25/2018 0412   BILIRUBINUR NEGATIVE 04/25/2018 0412   BILIRUBINUR small 12/03/2016 1117   BILIRUBINUR Negative 01/11/2016 1233   KETONESUR NEGATIVE 04/25/2018 0412   PROTEINUR 30 (A) 04/25/2018 0412   UROBILINOGEN 1.0 12/03/2016 1117   UROBILINOGEN 1.0 09/13/2016 1306   UROBILINOGEN 0.2 01/11/2016 1233   NITRITE NEGATIVE 04/25/2018 0412   LEUKOCYTESUR TRACE (A) 04/25/2018 0412   LEUKOCYTESUR Small 01/11/2016 1233   Sepsis Labs: @LABRCNTIP (procalcitonin:4,lacticidven:4) ) Recent Results (from the past 240  hour(s))  Culture, Urine     Status: Abnormal   Collection Time: 04/18/18  6:00 PM  Result Value Ref Range Status   Specimen Description   Final    URINE, CATHETERIZED Performed at [facility], [place] [address], [place], [place] [address]    Special Requests   Final    Immunocompromised Performed at [facility], [place] [address], [place], [place] [address]    Culture MULTIPLE SPECIES PRESENT, SUGGEST RECOLLECTION (A)  Final   Report Status 04/21/2018 FINAL  Final  Culture, blood (routine x 2)     Status: None (Preliminary result)   Collection Time: 04/20/18 12:46 AM  Result Value Ref Range Status   Specimen Description   Final    BLOOD RIGHT ANTECUBITAL Performed at [facility] [address] [place], [place] [address]    Special Requests  Final    BOTTLES DRAWN AEROBIC ONLY Blood Culture adequate volume Performed at [facility] [address], [place], [place] [address]    Culture   Final    NO GROWTH 4 DAYS Performed at [facility], [place] [address] [place], [place] [address]    Report Status PENDING  Incomplete  Culture, blood (routine x 2)     Status: None (Preliminary result)   Collection Time: 04/20/18 12:46 AM  Result Value Ref Range Status   Specimen Description   Final    BLOOD RIGHT HAND Performed at [facility] [address] [place], [place] [address]    Special Requests   Final    BOTTLES DRAWN AEROBIC ONLY Blood Culture adequate volume Performed at [facility] [address], [place], [place] [address]    Culture   Final    NO GROWTH 4 DAYS Performed at [facility], [place] [address] [place], [place] [address]    Report Status PENDING  Incomplete     Radiological Exams on Admission: Dg Chest 2 View  Result Date: 04/25/2018 CLINICAL DATA:  Fever and right flank pain since discharge. History of breast cancer. EXAM: CHEST -  2 VIEW COMPARISON:  Chest CT 04/02/2018 FINDINGS: Osteolytic metastatic disease of the included dorsal spine, shoulders, manubrium and sternum. Top-normal heart size. Nonaneurysmal thoracic aorta. No acute pulmonary consolidation, edema, effusion or pneumothorax. Left axillary clips are present. IMPRESSION: 1. No active cardiopulmonary disease. 2. Diffuse metastatic disease to bone. Electronically Signed   By: Ashley Royalty M.D.   On: 04/25/2018 00:55      Assessment/Plan Principal Problem:   SIRS (systemic inflammatory response syndrome) (HCC) Active Problems:   OSA (obstructive sleep apnea)   Essential hypertension   Pancytopenia ([place])   Patient is Jehovah's Witness   Pulmonary embolism ([place])   Metastatic breast cancer ([place])    1. SIRS with possible developing sepsis secondary to UTI -at admission patient was febrile tachycardic leukopenic and likely source could be urine.  Patient is on ceftriaxone.  Follow cultures. 2. Pancytopenia with further decreasing hemoglobin and history of Jehovah's witness -minimize blood draws and use pediatric tubes. 3. Hypertension on amlodipine and hydralazine. 4. History of pulmonary embolism and DVT on Lovenox. 5. Metastatic breast cancer being followed by Dr. Burr Medico.  Oncologist. 6. History of diastolic CHF presently receiving fluid given SIRS picture.  Closely monitor respiratory status.   DVT prophylaxis: Patient is on full dose Lovenox for DVT. Code Status: Full code. Family Communication: Family at the bedside. Disposition Plan: Home. Consults called: None. Admission status: Observation.   Rise Patience MD [facility] Pager [phone].  If 7PM-7AM, please contact night-coverage [email or web address] Password [facility]  04/25/2018, 5:41 AM

## 2018-04-25 NOTE — Progress Notes (Signed)
Michaela Little is a 58 y.o. female with history of metastatic breast cancer, diastolic CHF, hypertension, anemia and pancytopenia Jehovah's Witness presents to the ER after patient has been having subjective feeling of fever chills last 3 days.  Patient was just discharged 3 days ago after being admitted for hypercalcemia at that time patient was treated with IV bisphosphonate calcitonin Lasix.  Patient's calcium improved at discharge time and but states he has she has been developing some fever and dysuria for the last 3 days.   PLAN:  IV antibiotics for possible UTI.  Minimal blood draws as pt is severe anemic and is a Jehovah witness.  Discussed with Dr Burr Medico who will see the patient.  Respiratory panel to rule out URI.   Hosie Poisson, MD [phone]

## 2018-04-25 NOTE — ED Notes (Signed)
Pt resting with eyes closed, family at bedside.  

## 2018-04-25 NOTE — ED Notes (Signed)
Pt is alert and oriented x 4 and is verbally responsive. Pt daughters are at bedside. Pt has been eating and is c/o generalized body aches and weakness.

## 2018-04-25 NOTE — ED Notes (Signed)
Pt declined pain medication at this time.

## 2018-04-25 NOTE — ED Notes (Signed)
Pt transferred to hospital bed for comfort due to delay in admission

## 2018-04-25 NOTE — ED Notes (Signed)
ED TO INPATIENT HANDOFF REPORT  Name/Age/Gender Michaela Little 58 y.o. female  Code Status    Code Status Orders  (From admission, onward)         Start     Ordered   04/25/18 0541  Full code  Continuous     04/25/18 0541        Code Status History    Date Active Date Inactive Code Status Order ID Comments User Context   04/18/2018 1753 04/22/2018 2015 Full Code [id number]  Kayleen Memos, DO Inpatient   02/27/2018 2216 03/08/2018 1806 Full Code [id number]  Merton Border, MD Inpatient   01/02/2018 0127 01/04/2018 1637 Full Code [id number]  Shela Leff, MD ED   08/22/2017 1714 08/23/2017 1502 Full Code [id number]  Coralie Keens, MD Inpatient   10/26/2014 1215 10/28/2014 1749 Full Code [id number]  Molli Posey, MD Inpatient   10/01/2012 2334 10/02/2012 2028 Full Code [id number]  Delfina Redwood, MD Inpatient   03/05/2011 0323 03/06/2011 1804 Full Code [id number]  Leanora Ivanoff, RN Inpatient      Home/SNF/Other Home  Chief Complaint Flank Pain  Level of Care/Admitting Diagnosis ED Disposition    ED Disposition Condition [place]: [facility] [[id number]]  Level of Care: Telemetry [5]  Admit to tele based on following criteria: Monitor for Ischemic changes  Diagnosis: SIRS (systemic inflammatory response syndrome) [facility]) [389373]  Admitting Physician: Rise Patience [phone]  Attending Physician: Rise Patience [email or web address]  PT Class (Do Not Modify): Observation [104]  PT Acc Code (Do Not Modify): Observation [10022]       Medical History Past Medical History:  Diagnosis Date  . Anxiety   . Arthritis    hands, knees, hips  . Asthma   . Bronchitis   . Cancer ([place])   . Cervical stenosis (uterine cervix)   . Disorder of appendix    Enlarged  . Dyspnea on exertion   . GERD (gastroesophageal reflux disease)   . Headache(784.0)   . History of breast cancer    2010--  LEFT  s/p  mastectomy (in [place]) AND  CHEMORADIATION--  NO RECURRENCE  . History of cervical dysplasia   . Hyperlipidemia   . Hypertension   . Malignant neoplasm of overlapping sites of left breast in female, estrogen receptor positive ([place]) 03/05/2013  . OSA (obstructive sleep apnea) moderate osa per study  09/2010   CPAP  , NOT USING ON REGULAR BASIS  . Pelvic pain in female   . Positive H. pylori test    08-05-2013  . Positive TB test    AS TEEN--  [place] W/ MEDS  . Refusal of blood transfusions as patient is Jehovah's Witness   . Seasonal allergies   . Uterine fibroid   . Wears glasses     Allergies Allergies  Allergen Reactions  . Lisinopril-Hydrochlorothiazide Itching  . Adhesive [Tape] Itching and Other (See Comments)    Redness  . Fentanyl Other (See Comments)    GI upset and drowsiness. Only to [facility]  . Gabapentin Other (See Comments)    Auditory hallucinations  . Hctz [Hydrochlorothiazide] Itching  . Latex Itching  . Lisinopril Itching  . Losartan Potassium Other (See Comments)    Makes her feel "bad"   . Other Other (See Comments)    Patient refuses blood for religious reasons (per her notes)  . Oxycodone     GI upset, drowsy  . Tums [Calcium Carbonate Antacid] Hives  FRUIT-FLAVORED ONES    IV Location/Drains/Wounds Patient Lines/Drains/Airways Status   Active Line/Drains/Airways    Name:   Placement date:   Placement time:   Site:   Days:   Peripheral IV 04/25/18 Right Wrist   04/25/18    0024    Wrist   less than 1   Incision (Closed) Chest Right   -    -               Labs/Imaging Results for orders placed or performed during the hospital encounter of 04/24/18 (from the past 48 hour(s))  Lactic acid, plasma     Status: None   Collection Time: 04/25/18 12:17 AM  Result Value Ref Range   Lactic Acid, Venous 1.1 0.5 - 1.9 mmol/L    Comment: Performed at [facility], [place] [address] [place], [place] [address]  Comprehensive metabolic panel     Status:  Abnormal   Collection Time: 04/25/18 12:17 AM  Result Value Ref Range   Sodium 132 (L) 135 - 145 mmol/L   Potassium 3.8 3.5 - 5.1 mmol/L   Chloride 105 98 - 111 mmol/L   CO2 20 (L) 22 - 32 mmol/L   Glucose, Bld 111 (H) 70 - 99 mg/dL   BUN 12 6 - 20 mg/dL   Creatinine, Ser 0.87 0.44 - 1.00 mg/dL   Calcium 8.5 (L) 8.9 - 10.3 mg/dL   Total Protein 7.3 6.5 - 8.1 g/dL   Albumin 3.4 (L) 3.5 - 5.0 g/dL   AST 43 (H) 15 - 41 U/L   ALT 20 0 - 44 U/L   Alkaline Phosphatase 111 38 - 126 U/L   Total Bilirubin 1.2 0.3 - 1.2 mg/dL   GFR calc non Af Amer >60 >60 mL/min   GFR calc Af Amer >60 >60 mL/min   Anion gap 7 5 - 15    Comment: Performed at [facility], [place] [address], [place], [place] [address]  CBC with Differential     Status: Abnormal   Collection Time: 04/25/18 12:17 AM  Result Value Ref Range   WBC 3.9 (L) 4.0 - 10.5 K/uL   RBC 1.64 (L) 3.87 - 5.11 MIL/uL   Hemoglobin 4.5 (LL) 12.0 - 15.0 g/dL    Comment: This critical result has verified and been called to RN [facility] by Alda Lea on 01 31 2020 at 0049, and has been read back.    HCT 15.6 (L) 36.0 - 46.0 %   MCV 95.1 80.0 - 100.0 fL   MCH 27.4 26.0 - 34.0 pg   MCHC 28.8 (L) 30.0 - 36.0 g/dL   RDW 18.9 (H) 11.5 - 15.5 %   Platelets 140 (L) 150 - 400 K/uL   nRBC 6.0 (H) 0.0 - 0.2 %   Neutrophils Relative % 59 %   Neutro Abs 2.3 1.7 - 7.7 K/uL   Lymphocytes Relative 25 %   Lymphs Abs 1.0 0.7 - 4.0 K/uL   Monocytes Relative 8 %   Monocytes Absolute 0.3 0.1 - 1.0 K/uL   Eosinophils Relative 1 %   Eosinophils Absolute 0.1 0.0 - 0.5 K/uL   Basophils Relative 1 %   Basophils Absolute 0.0 0.0 - 0.1 K/uL   WBC Morphology MILD LEFT SHIFT (1-5% METAS, OCC MYELO, OCC BANDS)    Immature Granulocytes 6 %   Abs Immature Granulocytes 0.24 (H) 0.00 - 0.07 K/uL   Tear Drop Cells PRESENT    Polychromasia PRESENT  Comment: Performed at [facility], [place] [address], [place], [place] [address]   Urinalysis, Routine w reflex microscopic     Status: Abnormal   Collection Time: 04/25/18  4:12 AM  Result Value Ref Range   Color, Urine YELLOW YELLOW   APPearance CLEAR CLEAR   Specific Gravity, Urine 1.014 1.005 - 1.030   pH 6.0 5.0 - 8.0   Glucose, UA 50 (A) NEGATIVE mg/dL   Hgb urine dipstick NEGATIVE NEGATIVE   Bilirubin Urine NEGATIVE NEGATIVE   Ketones, ur NEGATIVE NEGATIVE mg/dL   Protein, ur 30 (A) NEGATIVE mg/dL   Nitrite NEGATIVE NEGATIVE   Leukocytes, UA TRACE (A) NEGATIVE   RBC / HPF 0-5 0 - 5 RBC/hpf   WBC, UA 6-10 0 - 5 WBC/hpf   Bacteria, UA RARE (A) NONE SEEN   Squamous Epithelial / LPF 0-5 0 - 5   Amorphous [place] PRESENT     Comment: Performed at [facility], [place] [address] [place], [place] [address]   Dg Chest 2 View  Result Date: 04/25/2018 CLINICAL DATA:  Fever and right flank pain since discharge. History of breast cancer. EXAM: CHEST - 2 VIEW COMPARISON:  Chest CT 04/02/2018 FINDINGS: Osteolytic metastatic disease of the included dorsal spine, shoulders, manubrium and sternum. Top-normal heart size. Nonaneurysmal thoracic aorta. No acute pulmonary consolidation, edema, effusion or pneumothorax. Left axillary clips are present. IMPRESSION: 1. No active cardiopulmonary disease. 2. Diffuse metastatic disease to bone. Electronically Signed   By: Ashley Royalty M.D.   On: 04/25/2018 00:55    Pending Labs Unresulted Labs (From admission, onward)    Start     Ordered   04/26/18 3149  Basic metabolic panel  Tomorrow morning,   R     04/25/18 0541   04/26/18 0500  CBC  Tomorrow morning,   R     04/25/18 0541   04/25/18 1131  Respiratory Panel by PCR  (Respiratory virus panel with precautions)  Once,   R     04/25/18 1130   04/25/18 0503  Urine culture  Add-on,   STAT    Question:  Patient immune status  Answer:  Immunocompromised   04/25/18 0503   04/25/18 0501  Blood culture (routine x 2)  BLOOD CULTURE X 2,   STAT    Question:  Patient  immune status  Answer:  Immunocompromised   04/25/18 0503          Vitals/Pain Today's Vitals   04/25/18 1100 04/25/18 1200 04/25/18 1203 04/25/18 1432  BP: 116/62 132/62 132/62 118/71  Pulse: 87 98 94 94  Resp: 20 15 20 18   Temp:   99.7 F (37.6 C)   TempSrc:   Oral   SpO2: 99% 100% 99% 99%  PainSc:        Isolation Precautions Droplet precaution  Medications Medications  traMADol (ULTRAM) tablet 100 mg (50 mg Oral Given 04/25/18 1531)  amLODipine (NORVASC) tablet 10 mg (10 mg Oral Given 04/25/18 0915)  hydrALAZINE (APRESOLINE) tablet 10 mg (10 mg Oral Given 04/25/18 0904)  polyethylene glycol (MIRALAX / GLYCOLAX) packet 17 g (17 g Oral Given 09/24/61 7858)  folic acid (FOLVITE) tablet 1 mg (1 mg Oral Given 04/25/18 0904)  vitamin B-12 (CYANOCOBALAMIN) tablet 1,000 mcg (1,000 mcg Oral Given 04/25/18 0904)  loratadine (CLARITIN) tablet 10 mg (10 mg Oral Given 04/25/18 0904)  ipratropium-albuterol (DUONEB) 0.5-2.5 (3) MG/3ML nebulizer solution 3 mL (has no administration in time range)  acetaminophen (TYLENOL) tablet 650  mg (has no administration in time range)    Or  acetaminophen (TYLENOL) suppository 650 mg (has no administration in time range)  ondansetron (ZOFRAN) tablet 4 mg (has no administration in time range)    Or  ondansetron (ZOFRAN) injection 4 mg (has no administration in time range)  0.9 %  sodium chloride infusion ( Intravenous New Bag/Given 04/25/18 0615)  cefTRIAXone (ROCEPHIN) 1 g in sodium chloride 0.9 % 100 mL IVPB (has no administration in time range)  enoxaparin (LOVENOX) injection 110 mg (110 mg Subcutaneous Given 04/25/18 0902)  sodium chloride flush (NS) 0.9 % injection 3 mL (3 mLs Intravenous Given 04/25/18 0313)  acetaminophen (TYLENOL) tablet 1,000 mg (1,000 mg Oral Given 04/25/18 0424)  traMADol (ULTRAM) tablet 50 mg (50 mg Oral Given 04/25/18 0424)  cefTRIAXone (ROCEPHIN) 1 g in sodium chloride 0.9 % 100 mL IVPB (0 g Intravenous Stopped 04/25/18 0646)     Mobility walks

## 2018-04-26 DIAGNOSIS — I10 Essential (primary) hypertension: Secondary | ICD-10-CM | POA: Diagnosis not present

## 2018-04-26 DIAGNOSIS — N3000 Acute cystitis without hematuria: Secondary | ICD-10-CM

## 2018-04-26 DIAGNOSIS — I5032 Chronic diastolic (congestive) heart failure: Secondary | ICD-10-CM | POA: Diagnosis not present

## 2018-04-26 DIAGNOSIS — R6510 Systemic inflammatory response syndrome (SIRS) of non-infectious origin without acute organ dysfunction: Secondary | ICD-10-CM | POA: Diagnosis not present

## 2018-04-26 DIAGNOSIS — C50919 Malignant neoplasm of unspecified site of unspecified female breast: Secondary | ICD-10-CM

## 2018-04-26 DIAGNOSIS — G4733 Obstructive sleep apnea (adult) (pediatric): Secondary | ICD-10-CM | POA: Diagnosis not present

## 2018-04-26 DIAGNOSIS — I110 Hypertensive heart disease with heart failure: Secondary | ICD-10-CM | POA: Diagnosis not present

## 2018-04-26 LAB — RESPIRATORY PANEL BY PCR
Adenovirus: NOT DETECTED
Bordetella pertussis: NOT DETECTED
Chlamydophila pneumoniae: NOT DETECTED
Coronavirus 229E: NOT DETECTED
Coronavirus HKU1: NOT DETECTED
Coronavirus NL63: NOT DETECTED
Coronavirus OC43: NOT DETECTED
Influenza A: NOT DETECTED
Influenza B: NOT DETECTED
Metapneumovirus: NOT DETECTED
Mycoplasma pneumoniae: NOT DETECTED
PARAINFLUENZA VIRUS 2-RVPPCR: NOT DETECTED
Parainfluenza Virus 1: NOT DETECTED
Parainfluenza Virus 3: NOT DETECTED
Parainfluenza Virus 4: NOT DETECTED
Respiratory Syncytial Virus: NOT DETECTED
Rhinovirus / Enterovirus: NOT DETECTED

## 2018-04-26 LAB — BASIC METABOLIC PANEL
Anion gap: 9 (ref 5–15)
BUN: 11 mg/dL (ref 6–20)
CO2: 19 mmol/L — ABNORMAL LOW (ref 22–32)
Calcium: 8.1 mg/dL — ABNORMAL LOW (ref 8.9–10.3)
Chloride: 105 mmol/L (ref 98–111)
Creatinine, Ser: 0.75 mg/dL (ref 0.44–1.00)
GFR calc Af Amer: >60 mL/min (ref >60)
GFR calc non Af Amer: >60 mL/min (ref >60)
Glucose, Bld: 104 mg/dL — ABNORMAL HIGH (ref 70–99)
Potassium: 3.8 mmol/L (ref 3.5–5.1)
Sodium: 133 mmol/L — ABNORMAL LOW (ref 135–145)

## 2018-04-26 LAB — CBC
HCT: 13.5 % — ABNORMAL LOW (ref 36.0–46.0)
Hemoglobin: 3.9 g/dL — CL (ref 12.0–15.0)
MCH: 27.5 pg (ref 26.0–34.0)
MCHC: 28.9 g/dL — AB (ref 30.0–36.0)
MCV: 95.1 fL (ref 80.0–100.0)
Platelets: 122 10*3/uL — ABNORMAL LOW (ref 150–400)
RBC: 1.42 MIL/uL — ABNORMAL LOW (ref 3.87–5.11)
RDW: 19.1 % — ABNORMAL HIGH (ref 11.5–15.5)
WBC: 3.4 10*3/uL — ABNORMAL LOW (ref 4.0–10.5)
nRBC: 4.4 % — ABNORMAL HIGH (ref 0.0–0.2)

## 2018-04-26 LAB — URINE CULTURE: Culture: <10000 — AB

## 2018-04-26 MED ORDER — TRAMADOL HCL 50 MG PO TABS
50.0000 mg | ORAL_TABLET | Freq: Four times a day (QID) | ORAL | Status: DC | PRN
  Administered 2018-04-28: 50 mg via ORAL
  Filled 2018-04-26: qty 1

## 2018-04-26 MED ORDER — DOCUSATE SODIUM 100 MG PO CAPS
100.0000 mg | ORAL_CAPSULE | Freq: Every day | ORAL | Status: DC | PRN
  Administered 2018-04-26: 100 mg via ORAL
  Filled 2018-04-26: qty 1

## 2018-04-26 MED ORDER — TRAMADOL HCL 50 MG PO TABS
50.0000 mg | ORAL_TABLET | Freq: Once | ORAL | Status: AC
  Administered 2018-04-26: 50 mg via ORAL
  Filled 2018-04-26: qty 1

## 2018-04-26 MED ORDER — GUAIFENESIN ER 600 MG PO TB12
600.0000 mg | ORAL_TABLET | Freq: Two times a day (BID) | ORAL | Status: DC | PRN
  Administered 2018-04-26: 600 mg via ORAL
  Filled 2018-04-26: qty 1

## 2018-04-26 NOTE — Progress Notes (Signed)
Pt denies chest pain, sitting with family at beside. O2 at 2l. Miralax given late per pt request. Pain better after pain meds. Will cont to monitor. SRP, RN

## 2018-04-26 NOTE — Progress Notes (Addendum)
Pt c/o chest pain, VSs. EKG completed--NSR , pt on O2 2l sat 100%. Pt states level 8 chest discomfort, pt had a large "belch", then felt some better pain level. Pt states IVC filter right neck area is hurting, MD updated. SRP, RN

## 2018-04-26 NOTE — Progress Notes (Signed)
Ultram 50 mg given for chest discomfort, pt states she is not able to take  Ultram 100 mg become stoo drowsy, md made aware and order adjusted. SRP, RN

## 2018-04-26 NOTE — Progress Notes (Signed)
Pt resting without distress. SRP, RN

## 2018-04-26 NOTE — Progress Notes (Signed)
PROGRESS NOTE    Michaela Little  KGU:[id number] DOB: Jan 27, 1961 DOA: 04/24/2018 PCP: Ladell Pier, MD   Brief Narrative:   Michaela Little a 58 y.o.femalewithhistory of metastatic breast cancer, diastolic CHF, hypertension, anemia and pancytopenia Jehovah's Witness presents to the ER after patient has been having subjective feeling of fever chills last 3 days. Patient was just discharged 3 days ago after being admitted for hypercalcemia at that time patient was treated with IV bisphosphonate calcitonin Lasix. Patient's calcium improved at discharge time and but states he has she has been developing some fever and dysuria for the last 3 days.  Assessment & Plan:   Principal Problem:   SIRS (systemic inflammatory response syndrome) (HCC) Active Problems:   OSA (obstructive sleep apnea)   Essential hypertension   Pancytopenia ([place])   Patient is Jehovah's Witness   Pulmonary embolism ([place])   Metastatic breast cancer ([place])  Metastatic breast cancer: Further management as per Dr Burr Medico.  Symptomatic management with pain meds.    Pancytopenia:  Pt is a Jehovah's witness.  Minimize blood draws and use pediatric tubes.    Fevers and possible UTI: Urine cultures show less than 1000 growth.  On IV rocephin.  Follow blood cultures.    Hypertension; well controlled.   Diastolic heart failure:  Stable.    DVT prophylaxis:  Code Status: Full code.  Family Communication: none at bedside.  Disposition Plan: pending clinical improvement.   Consultants:  None.   Procedures: NONE.    Antimicrobials: rocephin for possible UTI.    Subjective: Pt reports some subjective chest wall pain.  No nausea or vomiting.   Objective: Vitals:   04/26/18 0429 04/26/18 1016 04/26/18 1100 04/26/18 1437  BP: (!) 106/59 (!) 108/53    Pulse: 82 87  93  Resp: 17     Temp: 98.3 F (36.8 C) 99.6 F (37.6 C)  99.7 F (37.6 C)  TempSrc: Oral Oral  Oral  SpO2: 97% 100%  100%    Weight:   103.2 kg   Height:        Intake/Output Summary (Last 24 hours) at 04/26/2018 1741 Last data filed at 04/26/2018 1309 Gross per 24 hour  Intake 936.44 ml  Output 2600 ml  Net -1663.56 ml   Filed Weights   04/26/18 1100  Weight: 103.2 kg    Examination:  General exam: on 2 lit of [place] oxygen , alert and comfortable.  Respiratory system: Clear to auscultation. Respiratory effort normal. No wheezing or rhonchi.  Cardiovascular system: S1 & S2 heard, RRR. No JVD, murmurs, Gastrointestinal system: Abdomen is soft , NT ND BS+ Central nervous system: Alert and oriented. Non focal.  Extremities: no pedal edema.  Skin: No rashes, lesions or ulcers Psychiatry:  Mood & affect appropriate.     Data Reviewed: I have personally reviewed following labs and imaging studies  CBC: Recent Labs  Lab 04/20/18 0322 04/25/18 0017 04/26/18 0357  WBC 3.1* 3.9* 3.4*  NEUTROABS  --  2.3  --   HGB 5.1* 4.5* 3.9*  HCT 18.0* 15.6* 13.5*  MCV 98.4 95.1 95.1  PLT 98* 140* 628*   Basic Metabolic Panel: Recent Labs  Lab 04/20/18 0322 04/21/18 0310 04/22/18 0314 04/25/18 0017 04/26/18 0357  NA 138 133* 135 132* 133*  K 3.2* 3.5 3.2* 3.8 3.8  CL 102 103 104 105 105  CO2 27 22 21* 20* 19*  GLUCOSE 123* 127* 108* 111* 104*  BUN 17 16 16 12  11  CREATININE 1.65* 1.17* 0.97 0.87 0.75  CALCIUM 11.3* 9.0 8.5* 8.5* 8.1*   GFR: Estimated Creatinine Clearance: 92.3 mL/min (by C-G formula based on SCr of 0.75 mg/dL). Liver Function Tests: Recent Labs  Lab 04/20/18 0322 04/21/18 0310 04/22/18 0314 04/25/18 0017  AST 31 38 37 43*  ALT 13 13 12 20   ALKPHOS 84 74 79 111  BILITOT 1.3* 1.1 0.9 1.2  PROT 7.1 6.6 6.9 7.3  ALBUMIN 3.5 3.2* 3.1* 3.4*   No results for input(s): LIPASE, AMYLASE in the last 168 hours. No results for input(s): AMMONIA in the last 168 hours. Coagulation Profile: No results for input(s): INR, PROTIME in the last 168 hours. Cardiac Enzymes: Recent Labs  Lab  04/22/18 0825  TROPONINI <0.03   BNP (last 3 results) No results for input(s): PROBNP in the last 8760 hours. HbA1C: No results for input(s): HGBA1C in the last 72 hours. CBG: No results for input(s): GLUCAP in the last 168 hours. Lipid Profile: No results for input(s): CHOL, HDL, LDLCALC, TRIG, CHOLHDL, LDLDIRECT in the last 72 hours. Thyroid Function Tests: No results for input(s): TSH, T4TOTAL, FREET4, T3FREE, THYROIDAB in the last 72 hours. Anemia Panel: No results for input(s): VITAMINB12, FOLATE, FERRITIN, TIBC, IRON, RETICCTPCT in the last 72 hours. Sepsis Labs: Recent Labs  Lab 04/25/18 0017  LATICACIDVEN 1.1    Recent Results (from the past 240 hour(s))  Culture, Urine     Status: Abnormal   Collection Time: 04/18/18  6:00 PM  Result Value Ref Range Status   Specimen Description   Final    URINE, CATHETERIZED Performed at [place] [address], [place], [place] [address]    Special Requests   Final    Immunocompromised Performed at [facility], [place] [address] [place], [place] [address]    Culture MULTIPLE SPECIES PRESENT, SUGGEST RECOLLECTION (A)  Final   Report Status 04/21/2018 FINAL  Final  Culture, blood (routine x 2)     Status: None   Collection Time: 04/20/18 12:46 AM  Result Value Ref Range Status   Specimen Description   Final    BLOOD RIGHT ANTECUBITAL Performed at [facility] [address], [place], [place] [address]    Special Requests   Final    BOTTLES DRAWN AEROBIC ONLY Blood Culture adequate volume Performed at [facility] [address] [place], [place] [address]    Culture   Final    NO GROWTH 5 DAYS Performed at [facility], [place] [address] [place], [place] [address]    Report Status 04/25/2018 FINAL  Final  Culture, blood (routine x 2)     Status: None   Collection Time: 04/20/18 12:46 AM  Result Value Ref Range Status   Specimen  Description   Final    BLOOD RIGHT HAND Performed at [facility] [address] [place], [place] [address]    Special Requests   Final    BOTTLES DRAWN AEROBIC ONLY Blood Culture adequate volume Performed at [facility] [address] [place], [place] [address]    Culture   Final    NO GROWTH 5 DAYS Performed at [facility], [place] [address] [place], [place] [address]    Report Status 04/25/2018 FINAL  Final  Urine culture     Status: Abnormal   Collection Time: 04/25/18  4:12 AM  Result Value Ref Range Status   Specimen Description  Final    URINE, CLEAN CATCH Performed at [facility], [place] [address] [place], [place] [address]    Special Requests   Final    NONE Performed at [facility], [place] [address], [place], [place] [address]    Culture (A)  Final    <10,000 COLONIES/mL INSIGNIFICANT GROWTH Performed at [place] [address] [place], [place] [address]    Report Status 04/26/2018 FINAL  Final  Blood culture (routine x 2)     Status: None (Preliminary result)   Collection Time: 04/25/18  5:30 AM  Result Value Ref Range Status   Specimen Description BLOOD RIGHT HAND  Final   Special Requests   Final    BOTTLES DRAWN AEROBIC AND ANAEROBIC Blood Culture results may not be optimal due to an excessive volume of blood received in culture bottles Performed at [facility], [place] [address], [place], [place] [address]    Culture NO GROWTH 1 DAY  Final   Report Status PENDING  Incomplete  Blood culture (routine x 2)     Status: None (Preliminary result)   Collection Time: 04/25/18  5:30 AM  Result Value Ref Range Status   Specimen Description BLOOD RIGHT ANTECUBITAL  Final   Special Requests   Final    BOTTLES DRAWN AEROBIC AND ANAEROBIC Blood Culture adequate volume Performed at [facility] [address], [place],  [address]      Culture NO GROWTH 1 DAY  Final   Report Status PENDING  Incomplete  Respiratory Panel by PCR     Status: None   Collection Time: 04/25/18  6:23 PM  Result Value Ref Range Status   Adenovirus NOT DETECTED NOT DETECTED Final   Coronavirus 229E NOT DETECTED NOT DETECTED Final   Coronavirus HKU1 NOT DETECTED NOT DETECTED Final   Coronavirus NL63 NOT DETECTED NOT DETECTED Final   Coronavirus OC43 NOT DETECTED NOT DETECTED Final   Metapneumovirus NOT DETECTED NOT DETECTED Final   Rhinovirus / Enterovirus NOT DETECTED NOT DETECTED Final   Influenza A NOT DETECTED NOT DETECTED Final   Influenza B NOT DETECTED NOT DETECTED Final   Parainfluenza Virus 1 NOT DETECTED NOT DETECTED Final   Parainfluenza Virus 2 NOT DETECTED NOT DETECTED Final   Parainfluenza Virus 3 NOT DETECTED NOT DETECTED Final   Parainfluenza Virus 4 NOT DETECTED NOT DETECTED Final   Respiratory Syncytial Virus NOT DETECTED NOT DETECTED Final   Bordetella pertussis NOT DETECTED NOT DETECTED Final   Chlamydophila pneumoniae NOT DETECTED NOT DETECTED Final   Mycoplasma pneumoniae NOT DETECTED NOT DETECTED Final    Comment: Performed at [facility], [place] [address], [place],  [address]         Radiology Studies: Dg Chest 2 View  Result Date: 04/25/2018 CLINICAL DATA:  Fever and right flank pain since discharge. History of breast cancer. EXAM: CHEST - 2 VIEW COMPARISON:  Chest CT 04/02/2018 FINDINGS: Osteolytic metastatic disease of the included dorsal spine, shoulders, manubrium and sternum. Top-normal heart size. Nonaneurysmal thoracic aorta. No acute pulmonary consolidation, edema, effusion or pneumothorax. Left axillary clips are present. IMPRESSION: 1. No active cardiopulmonary disease. 2. Diffuse metastatic disease to bone. Electronically Signed   By: Ashley Royalty M.D.   On: 04/25/2018 00:55        Scheduled Meds: . amLODipine  10 mg Oral Daily  . enoxaparin  110 mg Subcutaneous Q12H  . fluticasone   1 spray Each  Nare Daily  . folic acid  1 mg Oral Daily  . hydrALAZINE  10 mg Oral TID  . loratadine  10 mg Oral Daily  . polyethylene glycol  17 g Oral Daily  . cyanocobalamin  1,000 mcg Oral Daily   Continuous Infusions: . cefTRIAXone (ROCEPHIN)  IV Stopped (04/25/18 2336)     LOS: 0 days    Time spent: 35 minutes.     Hosie Poisson, MD [facility] Pager [phone]  If 7PM-7AM, please contact night-coverage [email or web address] Password [facility] 04/26/2018, 5:41 PM

## 2018-04-26 NOTE — Progress Notes (Signed)
Resp panel negative Droplet precaution D/C'd by night RN per protocol. SRP, RN

## 2018-04-26 NOTE — Progress Notes (Signed)
CRITICAL VALUE ALERT  Critical Value:  Hemoglobin at 3.9  Date & Time Notied:  0502  Provider Notified: On call provider [place] notified via text/page  Orders Received/Actions taken: Provider made aware, will continue to monitor at this time.

## 2018-04-27 ENCOUNTER — Observation Stay (HOSPITAL_COMMUNITY): Payer: Medicare Other

## 2018-04-27 DIAGNOSIS — R6510 Systemic inflammatory response syndrome (SIRS) of non-infectious origin without acute organ dysfunction: Secondary | ICD-10-CM | POA: Diagnosis not present

## 2018-04-27 DIAGNOSIS — C50919 Malignant neoplasm of unspecified site of unspecified female breast: Secondary | ICD-10-CM | POA: Diagnosis not present

## 2018-04-27 DIAGNOSIS — I10 Essential (primary) hypertension: Secondary | ICD-10-CM | POA: Diagnosis not present

## 2018-04-27 MED ORDER — ENSURE ENLIVE PO LIQD
237.0000 mL | Freq: Two times a day (BID) | ORAL | Status: DC
  Administered 2018-04-27 – 2018-04-28 (×3): 237 mL via ORAL

## 2018-04-27 NOTE — Progress Notes (Signed)
Pt complains of gas and no BM. Pt request to sit to move out of bed. Pt also complains of congestion in head. On call provider consulted via text/page and pt transferred to chair. Pt given PRNs and bathed. Mobility tolerated well, and is resting comfortably without complains. No BM at this time. Will cont to monitor.

## 2018-04-27 NOTE — Progress Notes (Signed)
Consult was placed to restart piv;  Pt limited to right arm only;  Pt stated "I don't want to be stuck a million times.. try once;"  Attempted restart w ultrasound; vein infiltrated w NS flush;  Pt also needing to have ultrasound done of abdomen, and staff is here to do that at present.  Pt wants no more attempts at this time to restart the piv;  Will have another IV RN assess later.

## 2018-04-27 NOTE — Progress Notes (Signed)
PROGRESS NOTE    Michaela Little  OHY:[id number] DOB: 11-10-1960 DOA: 04/24/2018 PCP: Ladell Pier, MD   Brief Narrative:   [name] a 58 y.o.femalewithhistory of metastatic breast cancer, diastolic CHF, hypertension, anemia and pancytopenia Jehovah's Witness presents to the ER after patient has been having subjective feeling of fever chills last 3 days. Patient was just discharged 3 days ago after being admitted for hypercalcemia at that time patient was treated with IV bisphosphonate calcitonin Lasix. Patient's calcium improved at discharge time and but states he has she has been developing some fever and dysuria for the last 3 days.  Assessment & Plan:   Principal Problem:   SIRS (systemic inflammatory response syndrome) (HCC) Active Problems:   OSA (obstructive sleep apnea)   Essential hypertension   Pancytopenia ([place])   Patient is Jehovah's Witness   Pulmonary embolism ([place])   Metastatic breast cancer ([place])  Metastatic breast cancer: Further management as per Dr Burr Medico.  Symptomatic management with pain meds.    Pancytopenia:  Pt is a Jehovah's witness.  Minimize blood draws and use pediatric tubes.  Repeat H&Hin am.    Fevers and possible UTI: Urine cultures show less than 1000 growth.   will discontinue IV antibiotics.  Follow blood cultures.    Hypertension; well controlled.   Diastolic heart failure:  Stable.   Pt reports new right sided upper quadrant pain.  Will get [place] abd.  She reports some nausea too.  Will get LFT's.    DVT prophylaxis:  Code Status: Full code.  Family Communication: none at bedside.  Disposition Plan: pending clinical improvement.   Consultants:  None.   Procedures: NONE.    Antimicrobials: 3 days of ROCEPHIN.    Subjective: Continues to have chest wall pain.  Some nausea, and right upper quadrant pain.   Objective: Vitals:   04/26/18 2012 04/27/18 0505 04/27/18 0929 04/27/18 1255  BP: 126/68  129/68 122/60 (!) 116/56  Pulse: 90 88 85 85  Resp: 16 15  16   Temp: 98.9 F (37.2 C) 98.6 F (37 C)  98.6 F (37 C)  TempSrc: Oral Oral  Oral  SpO2: 100% 100% 100% 99%  Weight:      Height:        Intake/Output Summary (Last 24 hours) at 04/27/2018 1721 Last data filed at 04/27/2018 1422 Gross per 24 hour  Intake 470 ml  Output 1800 ml  Net -1330 ml   Filed Weights   04/26/18 1100  Weight: 103.2 kg    Examination:  General exam: on 2 lit of [place] oxygen , mild distress from abdominal pain.  Respiratory system: diminished air entry on the right  Cardiovascular system: S1 & S2 heard, RRR. No JVD, murmurs, Gastrointestinal system: Abdomen is soft , tender in the right upper quadrant. Bowel sounds good.  Central nervous system: Alert and oriented. Non focal.  Extremities: no pedal edema.  Skin: No rashes, lesions or ulcers Psychiatry:  Mood & affect appropriate.     Data Reviewed: I have personally reviewed following labs and imaging studies  CBC: Recent Labs  Lab 04/25/18 0017 04/26/18 0357  WBC 3.9* 3.4*  NEUTROABS 2.3  --   HGB 4.5* 3.9*  HCT 15.6* 13.5*  MCV 95.1 95.1  PLT 140* 948*   Basic Metabolic Panel: Recent Labs  Lab 04/21/18 0310 04/22/18 0314 04/25/18 0017 04/26/18 0357  NA 133* 135 132* 133*  K 3.5 3.2* 3.8 3.8  CL 103 104 105 105  CO2  22 21* 20* 19*  GLUCOSE 127* 108* 111* 104*  BUN 16 16 12 11   CREATININE 1.17* 0.97 0.87 0.75  CALCIUM 9.0 8.5* 8.5* 8.1*   GFR: Estimated Creatinine Clearance: 92.3 mL/min (by C-G formula based on SCr of 0.75 mg/dL). Liver Function Tests: Recent Labs  Lab 04/21/18 0310 04/22/18 0314 04/25/18 0017  AST 38 37 43*  ALT 13 12 20   ALKPHOS 74 79 111  BILITOT 1.1 0.9 1.2  PROT 6.6 6.9 7.3  ALBUMIN 3.2* 3.1* 3.4*   No results for input(s): LIPASE, AMYLASE in the last 168 hours. No results for input(s): AMMONIA in the last 168 hours. Coagulation Profile: No results for input(s): INR, PROTIME in the last  168 hours. Cardiac Enzymes: Recent Labs  Lab 04/22/18 0825  TROPONINI <0.03   BNP (last 3 results) No results for input(s): PROBNP in the last 8760 hours. HbA1C: No results for input(s): HGBA1C in the last 72 hours. CBG: No results for input(s): GLUCAP in the last 168 hours. Lipid Profile: No results for input(s): CHOL, HDL, LDLCALC, TRIG, CHOLHDL, LDLDIRECT in the last 72 hours. Thyroid Function Tests: No results for input(s): TSH, T4TOTAL, FREET4, T3FREE, THYROIDAB in the last 72 hours. Anemia Panel: No results for input(s): VITAMINB12, FOLATE, FERRITIN, TIBC, IRON, RETICCTPCT in the last 72 hours. Sepsis Labs: Recent Labs  Lab 04/25/18 0017  LATICACIDVEN 1.1    Recent Results (from the past 240 hour(s))  Culture, Urine     Status: Abnormal   Collection Time: 04/18/18  6:00 PM  Result Value Ref Range Status   Specimen Description   Final    URINE, CATHETERIZED Performed at [facility] [address] [place], [place] [address]    Special Requests   Final    Immunocompromised Performed at [facility], [place] [address], [place], [place] [address]    Culture MULTIPLE SPECIES PRESENT, SUGGEST RECOLLECTION (A)  Final   Report Status 04/21/2018 FINAL  Final  Culture, blood (routine x 2)     Status: None   Collection Time: 04/20/18 12:46 AM  Result Value Ref Range Status   Specimen Description   Final    BLOOD RIGHT ANTECUBITAL Performed at [facility] [address], [place], [place] [address]    Special Requests   Final    BOTTLES DRAWN AEROBIC ONLY Blood Culture adequate volume Performed at [facility] [address] [place], [place] [address]    Culture   Final    NO GROWTH 5 DAYS Performed at [facility], [place] [address], [place], [place] [address]    Report Status 04/25/2018 FINAL  Final  Culture, blood (routine x 2)     Status: None   Collection Time: 04/20/18 12:46 AM    Result Value Ref Range Status   Specimen Description   Final    BLOOD RIGHT HAND Performed at [facility] [address] [place], [place] [address]    Special Requests   Final    BOTTLES DRAWN AEROBIC ONLY Blood Culture adequate volume Performed at [facility] [address], [place], [place] [address]    Culture   Final    NO GROWTH 5 DAYS Performed at [facility], [place] [address], [place], [place] [address]    Report Status 04/25/2018 FINAL  Final  Urine culture     Status: Abnormal   Collection Time: 04/25/18  4:12 AM  Result Value Ref  Range Status   Specimen Description   Final    URINE, CLEAN CATCH Performed at [facility], [place] [address], [place], [place] [address]    Special Requests   Final    NONE Performed at [facility], [place] [address], [place], [place] [address]    Culture (A)  Final    <10,000 COLONIES/mL INSIGNIFICANT GROWTH Performed at [facility] [address], [place], [place] [address]    Report Status 04/26/2018 FINAL  Final  Blood culture (routine x 2)     Status: None (Preliminary result)   Collection Time: 04/25/18  5:30 AM  Result Value Ref Range Status   Specimen Description BLOOD RIGHT HAND  Final   Special Requests   Final    BOTTLES DRAWN AEROBIC AND ANAEROBIC Blood Culture results may not be optimal due to an excessive volume of blood received in culture bottles Performed at [facility], [place] [address], [place], [place] [address]    Culture NO GROWTH 2 DAYS  Final   Report Status PENDING  Incomplete  Blood culture (routine x 2)     Status: None (Preliminary result)   Collection Time: 04/25/18  5:30 AM  Result Value Ref Range Status   Specimen Description BLOOD RIGHT ANTECUBITAL  Final   Special Requests   Final    BOTTLES DRAWN AEROBIC AND ANAEROBIC Blood Culture adequate volume Performed at [facility] [address], [place], [place] [address]    Culture NO GROWTH 2 DAYS  Final   Report Status PENDING  Incomplete  Respiratory Panel by PCR     Status: None   Collection Time: 04/25/18  6:23 PM  Result Value Ref Range Status   Adenovirus NOT DETECTED NOT DETECTED Final   Coronavirus 229E NOT DETECTED NOT DETECTED Final   Coronavirus HKU1 NOT DETECTED NOT DETECTED Final   Coronavirus NL63 NOT DETECTED NOT DETECTED Final   Coronavirus OC43 NOT DETECTED NOT DETECTED Final   Metapneumovirus NOT DETECTED NOT DETECTED Final   Rhinovirus / Enterovirus NOT DETECTED NOT DETECTED Final   Influenza A NOT DETECTED NOT DETECTED Final   Influenza B NOT DETECTED NOT DETECTED Final   Parainfluenza Virus 1 NOT DETECTED NOT DETECTED Final   Parainfluenza Virus 2 NOT DETECTED NOT DETECTED Final   Parainfluenza Virus 3 NOT DETECTED NOT DETECTED Final   Parainfluenza Virus 4 NOT DETECTED NOT DETECTED Final   Respiratory Syncytial Virus NOT DETECTED NOT DETECTED Final   Bordetella pertussis NOT DETECTED NOT DETECTED Final   Chlamydophila pneumoniae NOT DETECTED NOT DETECTED Final   Mycoplasma pneumoniae NOT DETECTED NOT DETECTED Final    Comment: Performed at [facility], [place] [address], [place], [place] [address]         Radiology Studies: No results found.      Scheduled Meds: . amLODipine  10 mg Oral Daily  . enoxaparin  110 mg Subcutaneous Q12H  . feeding supplement (ENSURE ENLIVE)  237 mL Oral BID BM  . fluticasone  1 spray Each Nare Daily  . folic acid  1 mg Oral Daily  . hydrALAZINE  10 mg Oral TID  . loratadine  10 mg Oral Daily  . polyethylene glycol  17 g Oral Daily  . cyanocobalamin  1,000 mcg Oral Daily   Continuous Infusions: . cefTRIAXone (ROCEPHIN)  IV Stopped (04/26/18 2319)     LOS: 0 days    Time spent: 35 minutes.  Hosie Poisson, MD [facility] Pager [phone]  If 7PM-7AM, please contact night-coverage [email or web address] Password  [facility] 04/27/2018, 5:21 PM

## 2018-04-27 NOTE — Progress Notes (Signed)
Initial Nutrition Assessment  DOCUMENTATION CODES:   Obesity unspecified  INTERVENTION:   Continue Ensure Enlive po BID, each supplement provides 350 kcal and 20 grams of protein  NUTRITION DIAGNOSIS:   Inadequate oral intake related to poor appetite, constipation as evidenced by per patient/family report.  GOAL:   Patient will meet greater than or equal to 90% of their needs  MONITOR:   PO intake, Supplement acceptance, Labs, Weight trends, I & O's  REASON FOR ASSESSMENT:   Malnutrition Screening Tool    ASSESSMENT:   58 y.o. female with history of metastatic breast cancer, diastolic CHF, hypertension, anemia and pancytopenia Jehovah's Witness presents to the ER after patient has been having subjective feeling of fever chills last 3 days.  Patient was just discharged 3 days ago after being admitted for hypercalcemia.   Patient familiar to RD from previous admission  1/24-1/28. Pt had variable intakes during that time 10-100% but was starting to feel better once N/V improved. Pt's weight loss began following appendectomy in May 2019. Pt liked Ensure supplements at that time.  Pt now readmitted for fever. No PO intakes have been recorded yet. Pt continues to be constipated which is impacting her appetite. RD has ordered Ensure supplements.  Patient has had no further weight loss since last admission. Has had 18 lb of weight loss since 01/18/18 (7% wt loss x 3 months, significant for time frame).   Medications: Folic acid tablet daily, Miralax packet daily, Vitamin B-12 tablet daily Labs reviewed:  Low Na  NUTRITION - FOCUSED PHYSICAL EXAM:  Nutrition focused physical exam shows no sign of depletion of muscle mass or body fat.  Diet Order:   Diet Order            Diet Heart Room service appropriate? Yes; Fluid consistency: Thin  Diet effective now              EDUCATION NEEDS:   No education needs have been identified at this time  Skin:  Skin Assessment:  Reviewed RN Assessment  Last BM:  PTA  Height:   Ht Readings from Last 1 Encounters:  04/25/18 5\' 7"  (1.702 m)    Weight:   Wt Readings from Last 1 Encounters:  04/26/18 103.2 kg    Ideal Body Weight:  61.3 kg  BMI:  Body mass index is 35.63 kg/m.  Estimated Nutritional Needs:   Kcal:  1800-2000  Protein:  70-80g  Fluid:  2L/day  Clayton Bibles, MS, RD, LDN [facility] Pager: [phone] After Hours Pager: [phone]

## 2018-04-28 ENCOUNTER — Telehealth: Payer: Self-pay | Admitting: Hematology

## 2018-04-28 ENCOUNTER — Telehealth: Payer: Self-pay

## 2018-04-28 DIAGNOSIS — I10 Essential (primary) hypertension: Secondary | ICD-10-CM | POA: Diagnosis not present

## 2018-04-28 DIAGNOSIS — D630 Anemia in neoplastic disease: Secondary | ICD-10-CM | POA: Diagnosis not present

## 2018-04-28 DIAGNOSIS — C7951 Secondary malignant neoplasm of bone: Secondary | ICD-10-CM | POA: Diagnosis not present

## 2018-04-28 DIAGNOSIS — C50919 Malignant neoplasm of unspecified site of unspecified female breast: Secondary | ICD-10-CM | POA: Diagnosis not present

## 2018-04-28 DIAGNOSIS — R6510 Systemic inflammatory response syndrome (SIRS) of non-infectious origin without acute organ dysfunction: Secondary | ICD-10-CM | POA: Diagnosis not present

## 2018-04-28 LAB — HEPATIC FUNCTION PANEL
ALT: 16 U/L (ref 0–44)
AST: 23 U/L (ref 15–41)
Albumin: 2.6 g/dL — ABNORMAL LOW (ref 3.5–5.0)
Alkaline Phosphatase: 93 U/L (ref 38–126)
Bilirubin, Direct: 0.1 mg/dL (ref 0.0–0.2)
Indirect Bilirubin: 0.1 mg/dL — ABNORMAL LOW (ref 0.3–0.9)
Total Bilirubin: 0.2 mg/dL — ABNORMAL LOW (ref 0.3–1.2)
Total Protein: 6.3 g/dL — ABNORMAL LOW (ref 6.5–8.1)

## 2018-04-28 MED ORDER — DOCUSATE SODIUM 100 MG PO CAPS: 100.0000 mg | ORAL_CAPSULE | Freq: Every day | ORAL | 0 refills | Status: AC | PRN

## 2018-04-28 MED ORDER — ENOXAPARIN SODIUM 120 MG/0.8ML ~~LOC~~ SOLN: 100.0000 mg | Freq: Two times a day (BID) | SUBCUTANEOUS | Status: DC

## 2018-04-28 MED ORDER — ENOXAPARIN SODIUM 100 MG/ML ~~LOC~~ SOLN
100.0000 mg | Freq: Two times a day (BID) | SUBCUTANEOUS | Status: DC
  Filled 2018-04-28: qty 0.67

## 2018-04-28 MED ORDER — FLEET ENEMA 7-19 GM/118ML RE ENEM
1.0000 | ENEMA | Freq: Once | RECTAL | Status: AC
  Administered 2018-04-28: 1 via RECTAL
  Filled 2018-04-28: qty 1

## 2018-04-28 MED ORDER — MAGNESIUM HYDROXIDE 400 MG/5ML PO SUSP
30.0000 mL | Freq: Once | ORAL | Status: AC
  Administered 2018-04-28: 30 mL via ORAL
  Filled 2018-04-28: qty 30

## 2018-04-28 MED ORDER — FLUTICASONE PROPIONATE 50 MCG/ACT NA SUSP: 1.0000 | Freq: Every day | NASAL | 2 refills | Status: DC

## 2018-04-28 MED ORDER — ENSURE ENLIVE PO LIQD: 237.0000 mL | Freq: Two times a day (BID) | ORAL | 12 refills | Status: AC

## 2018-04-28 NOTE — Progress Notes (Signed)
Michaela Little   DOB:January 01, 1961   BH#:[id number]   [id number]  Oncology f/u   Subjective: Patient overall improved, she was sitting on the edge of her bed when I saw her, just received enema for constipation, had a small bowel movement.  She is probably going to be discharged from hospital later today.  Objective:  Vitals:   04/28/18 0919 04/28/18 1344  BP: 123/63 128/69  Pulse: 93 88  Resp:  15  Temp:  98.5 F (36.9 C)  SpO2: 100% 100%    Body mass index is 35.63 kg/m.  Intake/Output Summary (Last 24 hours) at 04/28/2018 1759 Last data filed at 04/28/2018 1600 Gross per 24 hour  Intake 1297 ml  Output 2075 ml  Net -778 ml     Sclerae unicteric, appears pale and chronically ill  Oropharynx clear  No peripheral adenopathy  Lungs clear -- no rales or rhonchi  Heart regular rate and rhythm  Abdomen benign  MSK no focal spinal tenderness, no peripheral edema  Neuro nonfocal   CBG (last 3)  No results for input(s): GLUCAP in the last 72 hours.   Labs:  Lab Results  Component Value Date   WBC 3.4 (L) 04/26/2018   HGB 3.9 (LL) 04/26/2018   HCT 13.5 (L) 04/26/2018   MCV 95.1 04/26/2018   PLT 122 (L) 04/26/2018   NEUTROABS 2.3 04/25/2018   CMP Latest Ref Rng & Units 04/28/2018 04/26/2018 04/25/2018  Glucose 70 - 99 mg/dL - 104(H) 111(H)  BUN 6 - 20 mg/dL - 11 12  Creatinine 0.44 - 1.00 mg/dL - 0.75 0.87  Sodium 135 - 145 mmol/L - 133(L) 132(L)  Potassium 3.5 - 5.1 mmol/L - 3.8 3.8  Chloride 98 - 111 mmol/L - 105 105  CO2 22 - 32 mmol/L - 19(L) 20(L)  Calcium 8.9 - 10.3 mg/dL - 8.1(L) 8.5(L)  Total Protein 6.5 - 8.1 g/dL 6.3(L) - 7.3  Total Bilirubin 0.3 - 1.2 mg/dL 0.2(L) - 1.2  Alkaline Phos 38 - 126 U/L 93 - 111  AST 15 - 41 U/L 23 - 43(H)  ALT 0 - 44 U/L 16 - 20     Urine Studies No results for input(s): UHGB, CRYS in the last 72 hours.  Invalid input(s): UACOL, UAPR, USPG, UPH, UTP, UGL, UKET, UBIL, UNIT, UROB, ULEU, UEPI, UWBC, Duwayne Heck [place],  [place]  Basic Metabolic Panel: Recent Labs  Lab 04/22/18 0314 04/25/18 0017 04/26/18 0357  NA 135 132* 133*  K 3.2* 3.8 3.8  CL 104 105 105  CO2 21* 20* 19*  GLUCOSE 108* 111* 104*  BUN _0 CREATININE 0.97 0.87 0.75  CALCIUM 8.5* 8.5* 8.1*   GFR Estimated Creatinine Clearance: 92.3 mL/min (by C-G formula based on SCr of 0.75 mg/dL). Liver Function Tests: Recent Labs  Lab 04/22/18 0314 04/25/18 0017 04/28/18 0447  AST 37 43* 23  ALT _1 ALKPHOS 79 111 93  BILITOT 0.9 1.2 0.2*  PROT 6.9 7.3 6.3*  ALBUMIN 3.1* 3.4* 2.6*   No results for input(s): LIPASE, AMYLASE in the last 168 hours. No results for input(s): AMMONIA in the last 168 hours. Coagulation profile No results for input(s): INR, PROTIME in the last 168 hours.  CBC: Recent Labs  Lab 04/25/18 0017 04/26/18 0357  WBC 3.9* 3.4*  NEUTROABS 2.3  --   HGB 4.5* 3.9*  HCT 15.6* 13.5*  MCV 95.1 95.1  PLT 140* 122*   Cardiac Enzymes: Recent Labs  Lab  04/22/18 0825  TROPONINI <0.03   BNP: Invalid input(s): POCBNP CBG: No results for input(s): GLUCAP in the last 168 hours. D-Dimer No results for input(s): DDIMER in the last 72 hours. Hgb A1c No results for input(s): HGBA1C in the last 72 hours. Lipid Profile No results for input(s): CHOL, HDL, LDLCALC, TRIG, CHOLHDL, LDLDIRECT in the last 72 hours. Thyroid function studies No results for input(s): TSH, T4TOTAL, T3FREE, THYROIDAB in the last 72 hours.  Invalid input(s): FREET3 Anemia work up No results for input(s): VITAMINB12, FOLATE, FERRITIN, TIBC, IRON, RETICCTPCT in the last 72 hours. Microbiology Recent Results (from the past 240 hour(s))  Culture, Urine     Status: Abnormal   Collection Time: 04/18/18  6:00 PM  Result Value Ref Range Status   Specimen Description   Final    URINE, CATHETERIZED Performed at [facility], [place] [address], [place], [place] [address]    Special Requests   Final     Immunocompromised Performed at [facility], [place] [address], [place], [place] [address]    Culture MULTIPLE SPECIES PRESENT, SUGGEST RECOLLECTION (A)  Final   Report Status 04/21/2018 FINAL  Final  Culture, blood (routine x 2)     Status: None   Collection Time: 04/20/18 12:46 AM  Result Value Ref Range Status   Specimen Description   Final    BLOOD RIGHT ANTECUBITAL Performed at [facility] [address] [place], [place] [address]    Special Requests   Final    BOTTLES DRAWN AEROBIC ONLY Blood Culture adequate volume Performed at [facility] [address] [place], [place] [address]    Culture   Final    NO GROWTH 5 DAYS Performed at [facility], [place] [address], [place], [place] [address]    Report Status 04/25/2018 FINAL  Final  Culture, blood (routine x 2)     Status: None   Collection Time: 04/20/18 12:46 AM  Result Value Ref Range Status   Specimen Description   Final    BLOOD RIGHT HAND Performed at [facility] [address], [place], [place] [address]    Special Requests   Final    BOTTLES DRAWN AEROBIC ONLY Blood Culture adequate volume Performed at [facility] [address], [place], [place] [address]    Culture   Final    NO GROWTH 5 DAYS Performed at [facility], [place] [address], [place], [place] [address]    Report Status 04/25/2018 FINAL  Final  Urine culture     Status: Abnormal   Collection Time: 04/25/18  4:12 AM  Result Value Ref Range Status   Specimen Description   Final    URINE, CLEAN CATCH Performed at [facility], [place] [address] [place], [place] [address]    Special Requests   Final    NONE Performed at [facility], [place] [address] [place], [place] [address]    Culture (A)  Final    <10,000 COLONIES/mL INSIGNIFICANT GROWTH Performed at [facility] [address],  [place],  [address]    Report Status 04/26/2018 FINAL  Final  Blood culture (routine x 2)     Status: None (Preliminary result)   Collection Time: 04/25/18  5:30 AM  Result Value Ref Range Status   Specimen Description   Final    BLOOD RIGHT HAND Performed at [facility]  [address] [place], [place] [address]    Special Requests   Final    BOTTLES DRAWN AEROBIC AND ANAEROBIC Blood Culture results may not be optimal due to an excessive volume of blood received in culture bottles Performed at [facility] [address] [place], [place] [address]    Culture   Final    NO GROWTH 3 DAYS Performed at [facility], [place] [address], [place], [place] [address]    Report Status PENDING  Incomplete  Blood culture (routine x 2)     Status: None (Preliminary result)   Collection Time: 04/25/18  5:30 AM  Result Value Ref Range Status   Specimen Description   Final    BLOOD RIGHT ANTECUBITAL Performed at [facility] [address], [place], [place] [address]    Special Requests   Final    BOTTLES DRAWN AEROBIC AND ANAEROBIC Blood Culture adequate volume Performed at [facility] [address], [place], [place] [address]    Culture   Final    NO GROWTH 3 DAYS Performed at [facility], [place] [address] [place], [place] [address]    Report Status PENDING  Incomplete  Respiratory Panel by PCR     Status: None   Collection Time: 04/25/18  6:23 PM  Result Value Ref Range Status   Adenovirus NOT DETECTED NOT DETECTED Final   Coronavirus 229E NOT DETECTED NOT DETECTED Final   Coronavirus HKU1 NOT DETECTED NOT DETECTED Final   Coronavirus NL63 NOT DETECTED NOT DETECTED Final   Coronavirus OC43 NOT DETECTED NOT DETECTED Final   Metapneumovirus NOT DETECTED NOT DETECTED Final   Rhinovirus / Enterovirus NOT DETECTED NOT DETECTED Final   Influenza A NOT DETECTED NOT DETECTED Final   Influenza B NOT  DETECTED NOT DETECTED Final   Parainfluenza Virus 1 NOT DETECTED NOT DETECTED Final   Parainfluenza Virus 2 NOT DETECTED NOT DETECTED Final   Parainfluenza Virus 3 NOT DETECTED NOT DETECTED Final   Parainfluenza Virus 4 NOT DETECTED NOT DETECTED Final   Respiratory Syncytial Virus NOT DETECTED NOT DETECTED Final   Bordetella pertussis NOT DETECTED NOT DETECTED Final   Chlamydophila pneumoniae NOT DETECTED NOT DETECTED Final   Mycoplasma pneumoniae NOT DETECTED NOT DETECTED Final    Comment: Performed at [facility], [place] [address], [place], [place] [address]      Studies:  US Abdomen Limited Ruq  Result Date: 04/27/2018 CLINICAL DATA:  58 year old female with RIGHT UPPER quadrant abdominal pain. History of cholecystectomy and breast cancer. EXAM: ULTRASOUND ABDOMEN LIMITED RIGHT UPPER QUADRANT COMPARISON:  04/02/2018 CT prior studies FINDINGS: Gallbladder: The gallbladder is not visualized compatible with cholecystectomy. Common bile duct: Diameter: 4 mm. No evidence of intrahepatic or extrahepatic biliary dilatation. Liver: At least 3 hypoechoic hepatic masses are noted including a 2.8 cm mass in the RIGHT liver, 1.9 cm mass in the RIGHT liver and a 1.4 cm mass in the LEFT liver. Portal vein is patent on color Doppler imaging with normal direction of blood flow towards the liver. IMPRESSION: 1. No acute abnormality. 2. At least 3 hypoechoic hepatic masses worrisome for metastatic disease. These were identified on recent CT. 3. No biliary dilatation. Electronically Signed   By: Margarette Canada M.D.   On: 04/27/2018 18:55    Assessment: 58 y.o. 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  1. Fever, ? UTI  vs tumor fever  2. Body pain secondary to bone mets  3.  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  Severe anemia, secondary to bone metastasis, Jehovah  witness 5. History of right LE DVT and PE, on lovenox  6. Cough and dyspnea  7. Recently history of hypercalcemia and AKI, resolved  8. Deconditioning   Plan:  -urine and blood cultures have been negative.  Antibiotics stopped.  She overall improved, although she still has moderate body pain. -anemia worse, Hg 3.9 2 days ago, no overt GI bleeding, possible related to hemodilution from IV fluids, and some blood draw in the hospital. -From oncology standpoint, okay to be discharged home today.  I plan to see her back later this week to start fulvestrant injections.   [name], MD 04/28/2018  5:59 PM

## 2018-04-28 NOTE — Progress Notes (Signed)
PT Cancellation Note  Patient Details Name: Michaela Little MRN: [id number] DOB: June 29, 1960   Cancelled Treatment:    Reason Eval/Treat Not Completed: Other (comment)(pt on [facility], just had enema per RN)   [place] Ram E 04/28/2018, 11:32 AM Carmelia Bake, PT, DPT Acute Rehabilitation Services Office: [phone] Pager: [phone]

## 2018-04-28 NOTE — Evaluation (Signed)
Physical Therapy Evaluation Patient Details Name: Michaela Little MRN: [id number] DOB: 22-Sep-1960 Today's Date: 04/28/2018   History of Present Illness  Michaela Little is a 58 y.o. 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 Pt currently with functional limitations due to the deficits listed below (see PT Problem List).  Pt will benefit from skilled PT to increase their independence and safety with mobility to allow discharge to the venue listed below.  RN reports pt to d/c later today and pt agreeable to ambulate.  Pt requested to use BSC prior to ambulating.  Pt tolerated 60 feet with RW however slow pace due to fatigue and weakness (anticipated due to low hgb).  Pt's daughter present for session.  Pt reports she has been working with HHPT so recommend pt continue with HHPT upon d/c.     Follow Up Recommendations Home health PT;Supervision for mobility/OOB    Equipment Recommendations  None recommended by PT    Recommendations for Other Services       Precautions / Restrictions Precautions Precautions: Fall Precaution Comments: low HGB, no blood products; pt reports 2L O2 [place] baseline      Mobility  Bed Mobility Overal bed mobility: Needs Assistance Bed Mobility: Supine to Sit;Sit to Supine     Supine to sit: Supervision Sit to supine: Min assist   General bed mobility comments: pt utilized elevated HOB, requested daughter assist with LEs back onto bed  Transfers Overall transfer level: Needs assistance Equipment used: None Transfers: Sit to/from [facility] Sit to Stand: Min guard Stand pivot transfers: Min guard       General transfer comment: min/guard  for safety (lines, low hgb)  Ambulation/Gait Ambulation/Gait assistance: Min guard Gait Distance (Feet): 60 Feet Assistive device: Rolling walker (2 wheeled) Gait Pattern/deviations: Step-through pattern;Decreased stride length Gait velocity: decr   General Gait Details: slow but steady gait with RW, 3 standing rest breaks due to fatigue, distance to pt tolerance, remained on 2L O2 [place]  Stairs            Wheelchair Mobility    Modified Rankin (Stroke Patients Only)       Balance Overall balance assessment: Needs assistance         Standing balance support: No upper extremity supported Standing balance-Leahy Scale: Fair Standing balance comment: static balance fair however utilized RW for support with ambulation                             Pertinent Vitals/Pain Pain Assessment: Faces Faces Pain Scale: Hurts little more Pain Location: R knee (more swollen compared to L knee) Pain Descriptors / Indicators: Sore;Tightness Pain Intervention(s): Limited activity within patient's tolerance;Monitored during session;Repositioned    Home Living Family/patient expects to be discharged to:: Private residence Living Arrangements: Alone Available Help at Discharge: Family Type of Home: House Home Access: Level entry     Home Layout: One level Home Equipment: Bedside commode;Shower seat;Walker - 2 wheels(hospital bed)      Prior Function Level of Independence: Independent with assistive device(s)         Comments: reports 2L O2 [place] at home     Hand Dominance  Extremity/Trunk Assessment        Lower Extremity Assessment Lower Extremity Assessment: Generalized weakness    Cervical / Trunk Assessment Cervical / Trunk Assessment: Normal  Communication   Communication: No difficulties  Cognition Arousal/Alertness: Awake/alert Behavior During Therapy: Flat affect Overall Cognitive Status: Within Functional Limits for tasks assessed                                         General Comments      Exercises     Assessment/Plan    PT Assessment Patient needs continued PT services  PT Problem List Decreased strength;Decreased activity tolerance;Decreased mobility;Decreased knowledge of precautions;Decreased knowledge of use of DME;Decreased balance       PT Treatment Interventions DME instruction;Gait training;Functional mobility training;Therapeutic activities;Patient/family education;Therapeutic exercise;Balance training    PT Goals (Current goals can be found in the Care Plan section)  Acute Rehab PT Goals PT Goal Formulation: With patient Time For Goal Achievement: 05/12/18 Potential to Achieve Goals: Good    Frequency Min 3X/week   Barriers to discharge        Co-evaluation               AM-PAC PT "6 Clicks" Mobility  Outcome Measure Help needed turning from your back to your side while in a flat bed without using bedrails?: A Little Help needed moving from lying on your back to sitting on the side of a flat bed without using bedrails?: A Little Help needed moving to and from a bed to a chair (including a wheelchair)?: A Little Help needed standing up from a chair using your arms (e.g., wheelchair or bedside chair)?: A Little Help needed to walk in hospital room?: A Little Help needed climbing 3-5 steps with a railing? : A Lot 6 Click Score: 17    End of Session Equipment Utilized During Treatment: Gait belt Activity Tolerance: Patient limited by fatigue Patient left: with call bell/phone within reach;in bed;with bed alarm set;with family/visitor present Nurse Communication: Mobility status PT Visit Diagnosis: Unsteadiness on feet (R26.81)    Time: 5277-8242 PT Time Calculation (min) (ACUTE ONLY): 17 min   Charges:   PT Evaluation $PT Eval Low Complexity: [place], PT, DPT Acute Rehabilitation Services Office: [phone] Pager:  [phone]  Trena Platt 04/28/2018, 3:57 PM

## 2018-04-28 NOTE — Care Management Note (Signed)
Case Management Note  Patient Details  Name: Michaela Little MRN: [id number] Date of Birth: Feb 08, 1961  Subjective/Objective:                    Action/Plan:Plan to discharge home with [place].    Expected Discharge Date:  (unknown)               Expected Discharge Plan:  [place]  In-House Referral:     Discharge planning Services  CM Consult  Post Acute Care Choice:    Choice offered to:  Patient  DME Arranged:    DME Agency:     HH Arranged:  RN, PT, OT, Nurse's Aide [place] Agency:  [place]  Status of Service:  Completed, signed off  If discussed at [place] of Stay Meetings, dates discussed:    Additional CommentsPurcell Mouton, RN 04/28/2018, 11:26 AM

## 2018-04-28 NOTE — Telephone Encounter (Signed)
Scheduled appt per 1/30 sch message - pt is aware of appt - no 2/7 slot available .

## 2018-04-28 NOTE — Care Management Obs Status (Signed)
[facility] NOTIFICATION   Patient Details  Name: Michaela Little MRN: [id number] Date of Birth: [birth year]   Medicare Observation Status Notification Given:  Yes    Purcell Mouton, RN 04/28/2018, 10:35 AM

## 2018-04-28 NOTE — Telephone Encounter (Signed)
Faxed signed order for [facility] PT back to [facility]. On 04/25/2018

## 2018-04-28 NOTE — Progress Notes (Signed)
Per Primary RN, Fleets administer with no results. Provider paged with update.

## 2018-04-29 NOTE — Progress Notes (Signed)
[place]   [phone] [phone]) [phone]   Clinic Follow up Note   Patient Care Team: Ladell Pier, MD as PCP - General (Internal Medicine) [name], MD as Consulting Physician (Pulmonary Disease) [name], MD as Consulting Physician (Hematology) [name], NP as Nurse Practitioner (Hospice and Palliative Medicine) Clent Demark, PA-C as Physician Assistant (Physician Assistant) 05/01/2018  CHIEF COMPLAINT: F/u of metastatic breast cancer and anemia   SUMMARY OF ONCOLOGIC HISTORY: Oncology History   Cancer Staging Malignant neoplasm of overlapping sites of left breast in female, estrogen receptor positive ([place]) Staging form: Breast, AJCC 7th Edition - Clinical: Stage IIIA (T2, N2, M0) - Unsigned       Malignant neoplasm of overlapping sites of left breast in female, estrogen receptor positive ([place])   05/2008 Cancer Diagnosis    Left sided breast cancer (no path report available)    11/2008 Pathologic Stage    Stage IIIA: T2 N2     Neo-Adjuvant Chemotherapy    Paclitaxel weekly x 12; doxorubicin and cyclophosphamide x 4 - both given with trifiparnib (treated at [facility] in [place])    11/2008 Definitive Surgery    Left modified radiation mastectomy: invasive lobular carcinoma, ER+, PR+, HER2/neu negative, 5/22 LN positive     - 03/2009 Radiation Therapy    Adjuvant radiation to left chest wall    2011 - 08/2014 Anti-estrogen oral therapy    Tamoxifen 20 mg used briefly; discontinued due to uterine lining concerns; changed to anastrozole 1 mg daily (began 07/2100); discontinued by patient summer of 2016    05/26/2015 Progression    Iliac bone biopsy showed metastatic carcinoma consistent with a breast primary, ER positive, PR and HER-2 negative. Her staging CT, and a PET scan was negative for visceral metastasis.    05/2015 - 01/2018 Anti-estrogen oral therapy    1. letrozole and Ibrance started March 2017  -Ibrance dose decreased to 75 mg daily. Stopped in 11/2017 due to anemia   -Letrozole switched to Exemestane on 02/13/17 due to b/l rib pain. She exemestane herself in 01/2018.      11/16/2015 Miscellaneous    Xgeva monthly for bone metastasis     07/03/2016 Imaging    CT chest, abdomen and pelvis with contrast showed no evidence of metastasis.     02/07/2017 Imaging    CT AP W Contrast 02/07/17 IMPRESSION: 1. No CT findings for abdominal/pelvic metastatic disease. 2. No acute abdominal findings, mass lesions or adenopathy. 3. Status post cholecystectomy with mild associated common bile duct dilatation. 4. Somewhat thickened appendix appears relatively stable. No acute inflammatory process. 5. Stable mixed lytic and sclerotic osseous metastatic disease.     02/07/2017 Imaging    Bone Scan Whole Body 02/07/17 IMPRESSION: No scintigraphic evidence skeletal metastasis     08/12/2017 Imaging    Whole Boday Scan 08/12/17 IMPRESSION: Scattered degenerative type uptake as above with a questionable focus of increased tracer localization at L2 vertebral body versus artifact; no abnormality is seen at this site by CT. Consider either characterization by MR or attention on follow-up imaging.    08/12/2017 Imaging    CT AP W Contrast 08/12/17  IMPRESSION: Continued thickening of the appendix is noted without surrounding inflammation. It has maximum measured diameter is decreased compared to prior exam. There is no evidence of acute inflammation. Stable mixed lytic and sclerotic appearance of visualized skeleton is noted consistent with history of known osseous metastases. No acute abnormality seen in the  abdomen or pelvis.    08/22/2017 Surgery    APPENDECTOMY LAPAROSCOPIC ERAS PATHWAY by Dr. Malcolm Metro and REMOVAL OF LEFT TISSUE EXPANDER by Dr. Harlow Mares 08/22/17    08/22/2017 Pathology Results    Diagnosis 08/22/17  1. Appendix, Other than Incidental - METASTATIC CARCINOMA, CONSISTENT  WITH BREAST PRIMARY. - CARCINOMA IS PRESENT AT THE SURGICAL RESECTION MARGIN AS WELL AS THE SEROSAL SURFACE. - LYMPHOVASCULAR INVASION IS IDENTIFIED. - SEE COMMENT. 2. Breast, capsule, Left - DENSE FIBROUS TISSUE WITH MILD INFLAMMATION, INCLUDING A FEW SCATTERED MULTINUCLEATED GIANT CELLS. - THERE IS NO EVIDENCE OF MALIGNANCY.    08/27/2017 Imaging    CT AP W Contrast 08/27/17 IMPRESSION: No evidence of pulmonary embolus. Small pericardial effusion with uncertain clinical significance. No evidence of acute abnormalities within the chest, abdomen or pelvis. Area of fat stranding and small focus of gas within the anterior abdominal wall, immediately inferior to the umbilicus, probably at the site of laparoscopic access.    10/07/2017 Echocardiogram    10/07/2017 ECO Study Conclusions  - Procedure narrative: Transthoracic echocardiography. Image   quality was adequate. The study was technically difficult, as a   result of poor acoustic windows. - Left ventricle: The cavity size was normal. Wall thickness was   normal. Systolic function was normal. The estimated ejection   fraction was in the range of 60% to 65%. Wall motion was normal;   there were no regional wall motion abnormalities. Features are   consistent with a pseudonormal left ventricular filling pattern,   with concomitant abnormal relaxation and increased filling   pressure (grade 2 diastolic dysfunction).    01/16/2018 PET scan    01/16/2018 PET Scan IMPRESSION: 1. Most striking finding is intense marrow activity diffusely throughout the axillary and appendicular skeleton. This is favored a physiologic marrow response from chronic anemia rather than diffuse metastatic disease. 2. Several discrete foci of more intense activity noted in the spine which could indicate malignancy or trauma. Difficult to define malignancy on the background of diffuse marrow activity. 3. Diffuse sclerosis throughout the bones similar to a  recent CT scans but new from remote PET-CT scan 03/01/2016. 4. No evidence soft tissue metastasis.     04/02/2018 Imaging    CT Angio CAP 04/02/18  IMPRESSION: 1. Multiple new hepatic lesions highly concerning for developing metastatic disease to the liver. This could be confirmed with nonemergent MRI of the abdomen with and without IV gadolinium if clinically appropriate. 2. Widespread metastatic disease to the bones also appears progressive compared to prior examinations. 3. Large right-sided pelvic deep venous thrombosis extending from the right common femoral vein to the proximal right external iliac vein. IVC filter is in position. No clot identified in the IVC filter at this time. No pulmonary embolism on today's examination. 4. Aortic atherosclerosis. 5. Additional incidental findings, as above.     Anti-estrogen oral therapy    Pending Fulvestrant injections monthly      CURRENT THERAPY  -Aranesp injection every 2 weeks -B12 injectionsmonthly  -Lovenox 180m q12h, changed to 1046mevery 12 hours in early Feb 2020 (adjusted based on her weight) -plan to startfulvestrant injection today   INTERVAL HISTORY: Michaela Little a [age].o. female who is here for follow-up. Since last seen she went to the ED for fever chills on 04/24/18  just after discharged 3 days ago after being admitted for hypercalcemia. Today, she is here with a family member. She is on a wheelchair and is on oxygen. She is not  feeling well and has very low energy. She is feeling constipated and is taking magnesium for the constipation but is not helping. She c/o of stomach cramps, has a productive cough, SOB. Denies having a fever since she left the hospital.     Pertinent positives and negatives of review of systems are listed and detailed within the above HPI.  REVIEW OF SYSTEMS:   Constitutional: Denies fevers, chills or abnormal weight loss, (+) low energy, (+) she is on a wheelchair, (+) oxygen  Eyes:  Denies blurriness of vision Ears, nose, mouth, throat, and face: Denies mucositis or sore throat Respiratory: Denies wheezes, (+) productive cough, (+) SOB Cardiovascular: Denies palpitation, chest discomfort or lower extremity swelling Gastrointestinal:  Denies nausea, heartburn, (+) constipation Skin: Denies abnormal skin rashes Lymphatics: Denies new lymphadenopathy or easy bruising Neurological:Denies numbness, tingling or new weaknesses Behavioral/Psych: Mood is stable, no new changes  All other systems were reviewed with the patient and are negative.  MEDICAL HISTORY:  Past Medical History:  Diagnosis Date  . Anxiety   . Arthritis    hands, knees, hips  . Asthma   . Bronchitis   . Cancer ([place])   . Cervical stenosis (uterine cervix)   . Disorder of appendix    Enlarged  . Dyspnea on exertion   . GERD (gastroesophageal reflux disease)   . Headache(784.0)   . History of breast cancer    2010--  LEFT  s/p  mastectomy (in [place]) AND CHEMORADIATION--  NO RECURRENCE  . History of cervical dysplasia   . Hyperlipidemia   . Hypertension   . Malignant neoplasm of overlapping sites of left breast in female, estrogen receptor positive ([place]) 03/05/2013  . OSA (obstructive sleep apnea) moderate osa per study  09/2010   CPAP  , NOT USING ON REGULAR BASIS  . Pelvic pain in female   . Positive H. pylori test    08-05-2013  . Positive TB test    [place] TE[place]--  [place] W/ MEDS  . Refusal of blood transfusions as patient is Jehovah's Witness   . Seasonal allergies   . Uterine fibroid   . Wears glasses     SURGICAL HISTORY: Past Surgical History:  Procedure Laterality Date  . APPENDECTOMY  y  . CARDIOVASCULAR STRESS TEST  10-29-2012   low risk perfusion study/  no significant reversibity/ ef 66%/  normal wall motion  . CERVICAL CONIZATION W/BX  2012   in  [place] N/A 05/14/2012   Procedure: LAPAROSCOPIC CHOLECYSTECTOMY;  Surgeon: Ralene Ok, MD;  Location: [facility];  Service:  General;  Laterality: N/A;  . COLONOSCOPY  2011   normal per patient - [place]  . DILATION AND CURETTAGE OF UTERUS  10/15/2011   Procedure: DILATATION AND CURETTAGE;  Surgeon: Melina Schools, MD;  Location: [facility];  Service: Gynecology;  Laterality: N/A;  Conization &  endocervical curettings  . EXAMINATION UNDER ANESTHESIA N/A 08/20/2013   Procedure: EXAM UNDER ANESTHESIA;  Surgeon: Margarette Asal, MD;  Location: [facility];  Service: Gynecology;  Laterality: N/A;  . [place]   right  . IR IVC FILTER PLMT / S&I /IMG GUID/MOD SED  03/01/2018  . IR RADIOLOGIST EVAL & MGMT  04/08/2018  . LAPAROSCOPIC APPENDECTOMY N/A 08/22/2017   Procedure: [place];  Surgeon: Coralie Keens, MD;  Location: [facility];  Service: General;  Laterality: N/A;  . LAPAROSCOPIC ASSISTED VAGINAL HYSTERECTOMY N/A 10/26/2014   Procedure: HYSTERECTOMY ABDOMINAL ;  Surgeon: Molli Posey, MD;  Location: [facility];  Service: Gynecology;  Laterality: N/A;  . MASTECTOMY Left 11/2008  in [place]   . SALPINGOOPHORECTOMY Bilateral 10/26/2014   Procedure: BILATERAL SALPINGO OOPHORECTOMY;  Surgeon: Molli Posey, MD;  Location: [facility];  Service: Gynecology;  Laterality: Bilateral;  . TISSUE EXPANDER PLACEMENT Left 08/22/2017   Procedure: REMOVAL OF LEFT TISSUE EXPANDER;  Surgeon: Crissie Reese, MD;  Location: [facility];  Service: Plastics;  Laterality: Left;  . TISSUE EXPANDER REMOVAL Left 08/22/2017  . TRANSTHORACIC ECHOCARDIOGRAM  10-29-2012   mild lvh/  ef 60-65%/  grade II diastolic dysfunction/  trivial mr  &  tr    I have reviewed the social history and family history with the patient and they are unchanged from previous note.  ALLERGIES:  is allergic to lisinopril-hydrochlorothiazide; adhesive [tape]; fentanyl; gabapentin; hctz [hydrochlorothiazide]; latex; lisinopril; losartan potassium; other; oxycodone; and tums [calcium carbonate antacid].  MEDICATIONS:    Current Outpatient Medications  Medication Sig Dispense Refill  . acetaminophen (TYLENOL) 500 MG tablet Take 500 mg by mouth every 6 (six) hours as needed for mild pain.    Marland Kitchen amLODipine (NORVASC) 10 MG tablet Take 1 tablet (10 mg total) by mouth daily. 30 tablet 0  . cetirizine (ZYRTEC) 10 MG tablet Take 10 mg by mouth daily.  11  . docusate sodium (COLACE) 100 MG capsule Take 1 capsule (100 mg total) by mouth daily as needed for mild constipation. 10 capsule 0  . feeding supplement, ENSURE ENLIVE, (ENSURE ENLIVE) LIQD Take 237 mLs by mouth 2 (two) times daily between meals. 237 mL 12  . fluticasone (FLONASE) 50 MCG/ACT nasal spray Place 1 spray into both nostrils daily.  2  . fluticasone furoate-vilanterol (BREO ELLIPTA) 100-25 MCG/INH AEPB Inhale 1 puff into the lungs daily. (Patient taking differently: Inhale 1 puff into the lungs daily as needed (sob and wheezing). ) 28 each 5  . folic acid (FOLVITE) 1 MG tablet Take 1 tablet (1 mg total) by mouth daily. 30 tablet 0  . hydrALAZINE (APRESOLINE) 10 MG tablet Take 1 tablet (10 mg total) by mouth 3 (three) times daily. 90 tablet 0  . ipratropium-albuterol (DUONEB) 0.5-2.5 (3) MG/3ML SOLN INHALE THREE MLS VIA NEBULIZER EVERY 6 HOURS AS NEEDED (Patient taking differently: Take 3 mLs by nebulization every 6 (six) hours as needed (for wheezing or shortness of breath). ) 360 mL 5  . Multiple Vitamins-Minerals (MULTIVITAMIN ADULT PO) Take 1 tablet by mouth daily.    . polyethylene glycol (MIRALAX / GLYCOLAX) packet Take 17 g by mouth daily. 14 each 0  . traMADol (ULTRAM) 50 MG tablet Take 2 tablets (100 mg total) by mouth every 6 (six) hours. (Patient taking differently: Take 100 mg by mouth every 6 (six) hours as needed for moderate pain or severe pain. ) 30 tablet 0  . vitamin B-12 1000 MCG tablet Take 1 tablet (1,000 mcg total) by mouth daily. 30 tablet 0  . enoxaparin (LOVENOX) 100 MG/ML injection Inject 1 mL (100 mg total) into the skin every 12  (twelve) hours. 60 Syringe 2  . lactulose (CHRONULAC) 10 GM/15ML solution Take 15-30 mLs (10-20 g total) by mouth 3 (three) times daily. 236 mL 1   No current facility-administered medications for this visit.    Facility-Administered Medications Ordered in Other Visits  Medication Dose Route Frequency Provider Last Rate Last Dose  . 0.9 %  sodium chloride infusion   Intravenous Once [name], MD      .  cyanocobalamin ((VITAMIN B-12)) injection 1,000 mcg  1,000 mcg Intramuscular Once [name], MD      . cyanocobalamin ((VITAMIN B-12)) injection 1,000 mcg  1,000 mcg Intramuscular Once [name], MD      . Darbepoetin Alfa Kyra Searles) injection 500 mcg  500 mcg Subcutaneous Once [name], MD      . fulvestrant (FASLODEX) injection 500 mg  500 mg Intramuscular Once [name], MD      . fulvestrant (FASLODEX) injection 500 mg  500 mg Intramuscular Once [name], MD        PHYSICAL EXAMINATION: ECOG PERFORMANCE STATUS: 3 - Symptomatic, >50% confined to bed  Vitals:   05/01/18 1353 05/01/18 1356  BP: (!) 142/59 (!) 141/68  Pulse: 100   Resp: 17   Temp: 98.2 F (36.8 C)   SpO2: 100%    Filed Weights   05/01/18 1353  Weight: 234 lb 14.4 oz (106.5 kg)    GENERAL:alert, no distress and comfortable, (+) on a wheelchair , (+) she was holding a cup to spit  SKIN: skin color, texture, turgor are normal, no rashes or significant lesions, (+) bruise at the site on injection  EYES: normal, Conjunctiva are pink and non-injected, sclera clear OROPHARYNX:no exudate, no erythema and lips, buccal mucosa, and tongue normal  NECK: supple, thyroid normal size, non-tender, without nodularity LYMPH:  no palpable lymphadenopathy in the cervical, axillary or inguinal LUNGS: clear to auscultation and percussion with normal breathing effort, (+) productive cough  HEART: regular rate & rhythm and no murmurs and no lower extremity edema ABDOMEN:abdomen soft, non-tender and normal bowel  sounds Musculoskeletal:no cyanosis of digits and no clubbing  NEURO: alert & oriented x 3 with fluent speech, no focal motor/sensory deficits  LABORATORY DATA:  I have reviewed the data as listed CBC Latest Ref Rng & Units 04/26/2018 04/25/2018 04/20/2018  WBC 4.0 - 10.5 K/uL 3.4(L) 3.9(L) 3.1(L)  Hemoglobin 12.0 - 15.0 g/dL 3.9(LL) 4.5(LL) 5.1(LL)  Hematocrit 36.0 - 46.0 % 13.5(L) 15.6(L) 18.0(L)  Platelets 150 - 400 K/uL 122(L) 140(L) 98(L)     CMP Latest Ref Rng & Units 04/28/2018 04/26/2018 04/25/2018  Glucose 70 - 99 mg/dL - 104(H) 111(H)  BUN 6 - 20 mg/dL - 11 12  Creatinine 0.44 - 1.00 mg/dL - 0.75 0.87  Sodium 135 - 145 mmol/L - 133(L) 132(L)  Potassium 3.5 - 5.1 mmol/L - 3.8 3.8  Chloride 98 - 111 mmol/L - 105 105  CO2 22 - 32 mmol/L - 19(L) 20(L)  Calcium 8.9 - 10.3 mg/dL - 8.1(L) 8.5(L)  Total Protein 6.5 - 8.1 g/dL 6.3(L) - 7.3  Total Bilirubin 0.3 - 1.2 mg/dL 0.2(L) - 1.2  Alkaline Phos 38 - 126 U/L 93 - 111  AST 15 - 41 U/L 23 - 43(H)  ALT 0 - 44 U/L 16 - 20      RADIOGRAPHIC STUDIES: I have personally reviewed the radiological images as listed and agreed with the findings in the report.   04/25/2018 DG Chest 2 View IMPRESSION: 1. No active cardiopulmonary disease. 2. Diffuse metastatic disease to bone.  04/27/2018 US Abdomen Limited RUQ  IMPRESSION: 1. No acute abnormality. 2. At least 3 hypoechoic hepatic masses worrisome for metastatic disease. These were identified on recent CT. 3. No biliary dilatation.   ASSESSMENT & PLAN:  CHARNETTE YOUNKIN is a 58 y.o. female with history of   3. 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 -Unfortunately she developed bone metastasis 1 year after she stoppinganastrozole.She was treated withAI and Ibrance. [name] was stopped in  11/2017 due to severe anemia. She also stoppedexemestane in late November 2019 herself. -Monthly Xgeva injection has been held since September 2019when she developed severe anemia  -We previously discussed her CT CAP from 04/02/18 which shows disease progression in bone and metastasis to liver. -Given her FO genomic test results show PIK3CA mutation, I recommended Alpelisib (Piqray), along with fulvestrant injection, however she is very concerned about worsening anemia from [facility]  -If she is able to tolerate Fulvestrant injection, we will consider adding Piqray after 1-2 months treatment.  -Her overall clinical condition has deteriorated gradually.  She has worsening body pain, likely related to her bone metastasis.  She was recently hospitalized again for fever, UTI and body pain.  She is very weak, hemoglobin dropped to 3.9 again.  No bleeding. -We again discussed the overall poor prognosis, and her cancer related symptoms.  She is not a candidate for chemotherapy, we again discussed fulvestrant injection, versus hospice and palliative care.  After lengthy discussion, she agrees to start fulvestrant injection today.  Next injection in 2 weeks -Follow-up in 2 weeks  2. Hypercalcemia, [place] secondary to her malignancy -She was previously on Xgeva, stopped when she developed a severe anemia.  She received a Zometa on April 15, 2018 for severe hypocalcemia. -Plan to restart Xgeva in a few months, if renal function remains good. Or sooner if her hypercalcemia recurs  3.Severe anemia  -She developed worsening anemia in September 2019, lowest hemoglobin 3.9, likely secondary to [place] and diffuse bone metastasis  -She is a Jehovah witness, refuses blood transfusion -She has been on Aranesp injection since then, anemia slowly improving. Will continue 560m injections every 2 weeks, will stop if Hb above 7-8 -will continue B12 injection monthly    4. Right LE DVT, Recent PE -She was advised to  restarted Lovenox 835m but stopped on her own due to the concerns of bleeding -She was recently hospitalized on 02/27/18 for multiple PE. This is likely related to metastatic cancer, immobility and Aranesp injections.Patient wishes to continue Aranesp injection despite the risk of thrombosis. -She has repeatedly declined thrombectomy and Angovac which was offered by IR Dr. McGeroge Baseman-Her ankle swelling has significantly decreased but still has stable swelling in her knee and upper leg. Continue Lovenox.   Due to her recent weight loss, I called in a new prescription of 100 mg every 12 hours  5. Cough and Dyspnea -f/u with pulmonologist Dr. WeMelvyn Novason Flovent,Cough improved with Hycodan, continue  -Cough is stable.   6. Body pain -She has been having bilateral rib cage pain, probably related to her bone metastasis -Take tramadol as needed  -Body pain is stable.   7. Goal of care discussion -We again discussed the incurable nature of her cancer, and the overall poor prognosis, especially if she does not have good response to chemotherapy or progress on chemo -The patient understands the goal of care is palliative. -I recommend DNR/DNI,we previously had long conversation about her goal of care, her friends also encouraged her to consider DNR,she will think about it.   Plan  -We will start fulvestrant injection today, continue Aranesp every 2 weeks, and B12 monthly - I refilled Lovenox dose reduced to 100 mg every 12 hours based on her current weight - F/u in 2 weeks with labs and injections  No problem-specific Assessment & Plan notes found for this encounter.   No orders of the defined types were placed in this encounter.  All questions were answered. The patient knows to call the clinic with any problems, questions or concerns. No barriers to learning was detected. I spent 20 minutes counseling the patient face to face. The total time spent in the appointment was 25 minutes  and more than 50% was on counseling and review of test results  I, Manson Allan am acting as scribe for Dr. [name].  I have reviewed the above documentation for accuracy and completeness, and I agree with the above.     [name], MD 05/01/2018

## 2018-04-30 LAB — CULTURE, BLOOD (ROUTINE X 2)
CULTURE: NO GROWTH
Culture: NO GROWTH
Special Requests: ADEQUATE

## 2018-05-01 ENCOUNTER — Telehealth: Payer: Self-pay

## 2018-05-01 ENCOUNTER — Inpatient Hospital Stay (HOSPITAL_BASED_OUTPATIENT_CLINIC_OR_DEPARTMENT_OTHER): Payer: Medicare Other | Admitting: Hematology

## 2018-05-01 ENCOUNTER — Encounter: Payer: Self-pay | Admitting: Hematology

## 2018-05-01 ENCOUNTER — Inpatient Hospital Stay: Payer: Medicare Other | Attending: Hematology

## 2018-05-01 ENCOUNTER — Inpatient Hospital Stay: Payer: Medicare Other

## 2018-05-01 VITALS — BP 141/68 | HR 100 | Temp 98.2°F | Resp 17 | Ht 67.0 in | Wt 234.9 lb

## 2018-05-01 DIAGNOSIS — Z5111 Encounter for antineoplastic chemotherapy: Secondary | ICD-10-CM | POA: Insufficient documentation

## 2018-05-01 DIAGNOSIS — C7951 Secondary malignant neoplasm of bone: Secondary | ICD-10-CM | POA: Insufficient documentation

## 2018-05-01 DIAGNOSIS — K5900 Constipation, unspecified: Secondary | ICD-10-CM

## 2018-05-01 DIAGNOSIS — Z170 Estrogen receptor positive status [ER+]: Secondary | ICD-10-CM

## 2018-05-01 DIAGNOSIS — C785 Secondary malignant neoplasm of large intestine and rectum: Secondary | ICD-10-CM | POA: Insufficient documentation

## 2018-05-01 DIAGNOSIS — R05 Cough: Secondary | ICD-10-CM

## 2018-05-01 DIAGNOSIS — Z9981 Dependence on supplemental oxygen: Secondary | ICD-10-CM

## 2018-05-01 DIAGNOSIS — C50912 Malignant neoplasm of unspecified site of left female breast: Secondary | ICD-10-CM

## 2018-05-01 DIAGNOSIS — C50919 Malignant neoplasm of unspecified site of unspecified female breast: Secondary | ICD-10-CM | POA: Diagnosis not present

## 2018-05-01 DIAGNOSIS — D649 Anemia, unspecified: Secondary | ICD-10-CM | POA: Insufficient documentation

## 2018-05-01 DIAGNOSIS — C50812 Malignant neoplasm of overlapping sites of left female breast: Secondary | ICD-10-CM

## 2018-05-01 DIAGNOSIS — R634 Abnormal weight loss: Secondary | ICD-10-CM

## 2018-05-01 DIAGNOSIS — I82401 Acute embolism and thrombosis of unspecified deep veins of right lower extremity: Secondary | ICD-10-CM | POA: Insufficient documentation

## 2018-05-01 DIAGNOSIS — R252 Cramp and spasm: Secondary | ICD-10-CM

## 2018-05-01 DIAGNOSIS — I2699 Other pulmonary embolism without acute cor pulmonale: Secondary | ICD-10-CM

## 2018-05-01 DIAGNOSIS — R059 Cough, unspecified: Secondary | ICD-10-CM

## 2018-05-01 DIAGNOSIS — R0602 Shortness of breath: Secondary | ICD-10-CM

## 2018-05-01 MED ORDER — LACTULOSE 10 GM/15ML PO SOLN: 10.0000 g | Freq: Three times a day (TID) | ORAL | 1 refills | Status: DC

## 2018-05-01 MED ORDER — FULVESTRANT 250 MG/5ML IM SOLN
500.0000 mg | Freq: Once | INTRAMUSCULAR | Status: AC
  Administered 2018-05-01: 500 mg via INTRAMUSCULAR

## 2018-05-01 MED ORDER — CYANOCOBALAMIN 1000 MCG/ML IJ SOLN
1000.0000 ug | Freq: Once | INTRAMUSCULAR | Status: AC
  Administered 2018-05-01: 1000 ug via INTRAMUSCULAR

## 2018-05-01 MED ORDER — ENOXAPARIN SODIUM 100 MG/ML ~~LOC~~ SOLN: 100.0000 mg | Freq: Two times a day (BID) | SUBCUTANEOUS | 2 refills | Status: DC

## 2018-05-01 MED ORDER — DARBEPOETIN ALFA 500 MCG/ML IJ SOSY
PREFILLED_SYRINGE | INTRAMUSCULAR | Status: AC
  Filled 2018-05-01: qty 1

## 2018-05-01 MED ORDER — DARBEPOETIN ALFA 500 MCG/ML IJ SOSY
500.0000 ug | PREFILLED_SYRINGE | Freq: Once | INTRAMUSCULAR | Status: AC
  Administered 2018-05-01: 500 ug via SUBCUTANEOUS

## 2018-05-01 MED ORDER — CYANOCOBALAMIN 1000 MCG/ML IJ SOLN
INTRAMUSCULAR | Status: AC
  Filled 2018-05-01: qty 1

## 2018-05-01 MED ORDER — FULVESTRANT 250 MG/5ML IM SOLN
INTRAMUSCULAR | Status: AC
  Filled 2018-05-01: qty 5

## 2018-05-01 NOTE — Telephone Encounter (Signed)
Printed avs and calender of upcoming appointment. Per 2/6 los 

## 2018-05-01 NOTE — Patient Instructions (Signed)
Cyanocobalamin, Pyridoxine, and Folate What is this medicine? A multivitamin containing folic acid, vitamin B6, and vitamin B12. This medicine may be used for other purposes; ask your health care provider or pharmacist if you have questions. COMMON BRAND NAME(S): AllanFol RX, AllanTex, Av-Vite FB, B Complex with Folic Acid, ComBgen, FaBB, Folamin, Folastin, [place], [place], [place], [place], [place], [employer], [place] RX 2.2, [place], [place] 2.2, Foltabs 800, Foltx, Homocysteine Formula, Niva-Fol, NuFol, [employer], Virt-Gard, Virt-Vite, Virt-Vite [place], Vita-Respa What should I tell my health care provider before I take this medicine? They need to know if you have any of these conditions: -bleeding or clotting disorder -history of anemia of any type -other chronic health condition -an unusual or allergic reaction to vitamins, other medicines, foods, dyes, or preservatives -pregnant or trying to get pregnant -breast-feeding How should I use this medicine? Take by mouth with a glass of water. May take with food. Follow the directions on the prescription label. It is usually given once a day. Do not take your medicine more often than directed. Contact your pediatrician regarding the use of this medicine in children. Special care may be needed. Overdosage: If you think you have taken too much of this medicine contact a poison control center or emergency room at once. NOTE: This medicine is only for you. Do not share this medicine with others. What if I miss a dose? If you miss a dose, take it as soon as you can. If it is almost time for your next dose, take only that dose. Do not take double or extra doses. What may interact with this medicine? -levodopa This list may not describe all possible interactions. Give your health care provider a list of all the medicines, herbs, non-prescription drugs, or dietary supplements you use. Also tell them if you smoke, drink alcohol, or use illegal drugs.  Some items may interact with your medicine. What should I watch for while using this medicine? See your health care professional for regular checks on your progress. Remember that vitamin supplements do not replace the need for good nutrition from a balanced diet. What side effects may I notice from receiving this medicine? Side effects that you should report to your doctor or health care professional as soon as possible: -allergic reaction such as skin rash or difficulty breathing -vomiting Side effects that usually do not require medical attention (report to your doctor or health care professional if they continue or are bothersome): -nausea -stomach upset This list may not describe all possible side effects. Call your doctor for medical advice about side effects. You may report side effects to FDA at 1-800-FDA-1088. Where should I keep my medicine? Keep out of the reach of children. Most vitamins should be stored at controlled room temperature. Check your specific product directions. Protect from heat and moisture. Throw away any unused medicine after the expiration date. NOTE: This sheet is a summary. It may not cover all possible information. If you have questions about this medicine, talk to your doctor, pharmacist, or health care provider.  2019 Elsevier/Gold Standard (2007-05-03 00:59:55) Fulvestrant injection What is this medicine? FULVESTRANT (ful VES trant) blocks the effects of estrogen. It is used to treat breast cancer. This medicine may be used for other purposes; ask your health care provider or pharmacist if you have questions. COMMON BRAND NAME(S): FASLODEX What should I tell my health care provider before I take this medicine? They need to know if you have any of these conditions: -bleeding disorders -liver disease -  low blood counts, like low white cell, platelet, or red cell counts -an unusual or allergic reaction to fulvestrant, other medicines, foods, dyes, or  preservatives -pregnant or trying to get pregnant -breast-feeding How should I use this medicine? This medicine is for injection into a muscle. It is usually given by a health care professional in a hospital or clinic setting. Talk to your pediatrician regarding the use of this medicine in children. Special care may be needed. Overdosage: If you think you have taken too much of this medicine contact a poison control center or emergency room at once. NOTE: This medicine is only for you. Do not share this medicine with others. What if I miss a dose? It is important not to miss your dose. Call your doctor or health care professional if you are unable to keep an appointment. What may interact with this medicine? -medicines that treat or prevent blood clots like warfarin, enoxaparin, dalteparin, apixaban, dabigatran, and rivaroxaban This list may not describe all possible interactions. Give your health care provider a list of all the medicines, herbs, non-prescription drugs, or dietary supplements you use. Also tell them if you smoke, drink alcohol, or use illegal drugs. Some items may interact with your medicine. What should I watch for while using this medicine? Your condition will be monitored carefully while you are receiving this medicine. You will need important blood work done while you are taking this medicine. Do not become pregnant while taking this medicine or for at least 1 year after stopping it. Women of child-bearing potential will need to have a negative pregnancy test before starting this medicine. Women should inform their doctor if they wish to become pregnant or think they might be pregnant. There is a potential for serious side effects to an unborn child. Men should inform their doctors if they wish to father a child. This medicine may lower sperm counts. Talk to your health care professional or pharmacist for more information. Do not breast-feed an infant while taking this medicine or  for 1 year after the last dose. What side effects may I notice from receiving this medicine? Side effects that you should report to your doctor or health care professional as soon as possible: -allergic reactions like skin rash, itching or hives, swelling of the face, lips, or tongue -feeling faint or lightheaded, falls -pain, tingling, numbness, or weakness in the legs -signs and symptoms of infection like fever or chills; cough; flu-like symptoms; sore throat -vaginal bleeding Side effects that usually do not require medical attention (report to your doctor or health care professional if they continue or are bothersome): -aches, pains -constipation -diarrhea -headache -hot flashes -nausea, vomiting -pain at site where injected -stomach pain This list may not describe all possible side effects. Call your doctor for medical advice about side effects. You may report side effects to FDA at 1-800-FDA-1088. Where should I keep my medicine? This drug is given in a hospital or clinic and will not be stored at home. NOTE: This sheet is a summary. It may not cover all possible information. If you have questions about this medicine, talk to your doctor, pharmacist, or health care provider.  2019 Elsevier/Gold Standard (2017-06-20 11:34:41) Darbepoetin Alfa injection What is this medicine? DARBEPOETIN ALFA (dar be POE e tin AL fa) helps your body make more red blood cells. It is used to treat anemia caused by chronic kidney failure and chemotherapy. This medicine may be used for other purposes; ask your health care provider or pharmacist  if you have questions. COMMON BRAND NAME(S): Aranesp What should I tell my health care provider before I take this medicine? They need to know if you have any of these conditions: -blood clotting disorders or history of blood clots -cancer patient not on chemotherapy -cystic fibrosis -heart disease, such as angina, heart failure, or a history of a heart  attack -hemoglobin level of 12 g/dL or greater -high blood pressure -low levels of folate, iron, or vitamin B12 -seizures -an unusual or allergic reaction to darbepoetin, erythropoietin, albumin, hamster proteins, latex, other medicines, foods, dyes, or preservatives -pregnant or trying to get pregnant -breast-feeding How should I use this medicine? This medicine is for injection into a vein or under the skin. It is usually given by a health care professional in a hospital or clinic setting. If you get this medicine at home, you will be taught how to prepare and give this medicine. Use exactly as directed. Take your medicine at regular intervals. Do not take your medicine more often than directed. It is important that you put your used needles and syringes in a special sharps container. Do not put them in a trash can. If you do not have a sharps container, call your pharmacist or healthcare provider to get one. A special MedGuide will be given to you by the pharmacist with each prescription and refill. Be sure to read this information carefully each time. Talk to your pediatrician regarding the use of this medicine in children. While this medicine may be used in children as young as 69 month of age for selected conditions, precautions do apply. Overdosage: If you think you have taken too much of this medicine contact a poison control center or emergency room at once. NOTE: This medicine is only for you. Do not share this medicine with others. What if I miss a dose? If you miss a dose, take it as soon as you can. If it is almost time for your next dose, take only that dose. Do not take double or extra doses. What may interact with this medicine? Do not take this medicine with any of the following medications: -epoetin alfa This list may not describe all possible interactions. Give your health care provider a list of all the medicines, herbs, non-prescription drugs, or dietary supplements you use.  Also tell them if you smoke, drink alcohol, or use illegal drugs. Some items may interact with your medicine. What should I watch for while using this medicine? Your condition will be monitored carefully while you are receiving this medicine. You may need blood work done while you are taking this medicine. This medicine may cause a decrease in vitamin B6. You should make sure that you get enough vitamin B6 while you are taking this medicine. Discuss the foods you eat and the vitamins you take with your health care professional. What side effects may I notice from receiving this medicine? Side effects that you should report to your doctor or health care professional as soon as possible: -allergic reactions like skin rash, itching or hives, swelling of the face, lips, or tongue -breathing problems -changes in vision -chest pain -confusion, trouble speaking or understanding -feeling faint or lightheaded, falls -high blood pressure -muscle aches or pains -pain, swelling, warmth in the leg -rapid weight gain -severe headaches -sudden numbness or weakness of the face, arm or leg -trouble walking, dizziness, loss of balance or coordination -seizures (convulsions) -swelling of the ankles, feet, hands -unusually weak or tired Side effects that usually do  not require medical attention (report to your doctor or health care professional if they continue or are bothersome): -diarrhea -fever, chills (flu-like symptoms) -headaches -nausea, vomiting -redness, stinging, or swelling at site where injected This list may not describe all possible side effects. Call your doctor for medical advice about side effects. You may report side effects to FDA at 1-800-FDA-1088. Where should I keep my medicine? Keep out of the reach of children. Store in a refrigerator between 2 and 8 degrees C (36 and 46 degrees F). Do not freeze. Do not shake. Throw away any unused portion if using a single-dose vial. Throw away  any unused medicine after the expiration date. NOTE: This sheet is a summary. It may not cover all possible information. If you have questions about this medicine, talk to your doctor, pharmacist, or health care provider.  2019 Elsevier/Gold Standard (2017-03-27 16:44:20)

## 2018-05-02 ENCOUNTER — Other Ambulatory Visit: Payer: Self-pay | Admitting: Hematology

## 2018-05-02 ENCOUNTER — Inpatient Hospital Stay (HOSPITAL_COMMUNITY)
Admission: EM | Admit: 2018-05-02 | Discharge: 2018-05-08 | DRG: 872 | Disposition: A | Discharge status: Home Health | Payer: Medicare Other | Attending: Internal Medicine | Admitting: Internal Medicine

## 2018-05-02 ENCOUNTER — Emergency Department (HOSPITAL_COMMUNITY): Payer: Medicare Other

## 2018-05-02 ENCOUNTER — Encounter (HOSPITAL_COMMUNITY): Payer: Self-pay | Admitting: Emergency Medicine

## 2018-05-02 ENCOUNTER — Other Ambulatory Visit: Payer: Self-pay

## 2018-05-02 ENCOUNTER — Telehealth: Payer: Self-pay | Admitting: Hematology

## 2018-05-02 DIAGNOSIS — M19041 Primary osteoarthritis, right hand: Secondary | ICD-10-CM | POA: Diagnosis present

## 2018-05-02 DIAGNOSIS — A4189 Other specified sepsis: Principal | ICD-10-CM | POA: Diagnosis present

## 2018-05-02 DIAGNOSIS — Z6836 Body mass index (BMI) 36.0-36.9, adult: Secondary | ICD-10-CM

## 2018-05-02 DIAGNOSIS — J101 Influenza due to other identified influenza virus with other respiratory manifestations: Secondary | ICD-10-CM | POA: Diagnosis present

## 2018-05-02 DIAGNOSIS — C7951 Secondary malignant neoplasm of bone: Secondary | ICD-10-CM | POA: Diagnosis present

## 2018-05-02 DIAGNOSIS — Z9012 Acquired absence of left breast and nipple: Secondary | ICD-10-CM

## 2018-05-02 DIAGNOSIS — M19042 Primary osteoarthritis, left hand: Secondary | ICD-10-CM | POA: Diagnosis present

## 2018-05-02 DIAGNOSIS — Z9981 Dependence on supplemental oxygen: Secondary | ICD-10-CM

## 2018-05-02 DIAGNOSIS — Z800 Family history of malignant neoplasm of digestive organs: Secondary | ICD-10-CM

## 2018-05-02 DIAGNOSIS — Z803 Family history of malignant neoplasm of breast: Secondary | ICD-10-CM

## 2018-05-02 DIAGNOSIS — Z8249 Family history of ischemic heart disease and other diseases of the circulatory system: Secondary | ICD-10-CM

## 2018-05-02 DIAGNOSIS — Z8261 Family history of arthritis: Secondary | ICD-10-CM

## 2018-05-02 DIAGNOSIS — I82411 Acute embolism and thrombosis of right femoral vein: Secondary | ICD-10-CM | POA: Diagnosis present

## 2018-05-02 DIAGNOSIS — E440 Moderate protein-calorie malnutrition: Secondary | ICD-10-CM | POA: Diagnosis present

## 2018-05-02 DIAGNOSIS — Z888 Allergy status to other drugs, medicaments and biological substances status: Secondary | ICD-10-CM

## 2018-05-02 DIAGNOSIS — Z853 Personal history of malignant neoplasm of breast: Secondary | ICD-10-CM

## 2018-05-02 DIAGNOSIS — J9610 Chronic respiratory failure, unspecified whether with hypoxia or hypercapnia: Secondary | ICD-10-CM | POA: Diagnosis present

## 2018-05-02 DIAGNOSIS — Z91048 Other nonmedicinal substance allergy status: Secondary | ICD-10-CM

## 2018-05-02 DIAGNOSIS — Z818 Family history of other mental and behavioral disorders: Secondary | ICD-10-CM

## 2018-05-02 DIAGNOSIS — D61818 Other pancytopenia: Secondary | ICD-10-CM | POA: Diagnosis present

## 2018-05-02 DIAGNOSIS — J111 Influenza due to unidentified influenza virus with other respiratory manifestations: Secondary | ICD-10-CM | POA: Diagnosis present

## 2018-05-02 DIAGNOSIS — I110 Hypertensive heart disease with heart failure: Secondary | ICD-10-CM | POA: Diagnosis present

## 2018-05-02 DIAGNOSIS — R042 Hemoptysis: Secondary | ICD-10-CM

## 2018-05-02 DIAGNOSIS — Z86718 Personal history of other venous thrombosis and embolism: Secondary | ICD-10-CM

## 2018-05-02 DIAGNOSIS — IMO0001 Reserved for inherently not codable concepts without codable children: Secondary | ICD-10-CM | POA: Diagnosis present

## 2018-05-02 DIAGNOSIS — Z7901 Long term (current) use of anticoagulants: Secondary | ICD-10-CM

## 2018-05-02 DIAGNOSIS — Z833 Family history of diabetes mellitus: Secondary | ICD-10-CM

## 2018-05-02 DIAGNOSIS — Z825 Family history of asthma and other chronic lower respiratory diseases: Secondary | ICD-10-CM

## 2018-05-02 DIAGNOSIS — Z531 Procedure and treatment not carried out because of patient's decision for reasons of belief and group pressure: Secondary | ICD-10-CM | POA: Diagnosis present

## 2018-05-02 DIAGNOSIS — K219 Gastro-esophageal reflux disease without esophagitis: Secondary | ICD-10-CM | POA: Diagnosis present

## 2018-05-02 DIAGNOSIS — Z789 Other specified health status: Secondary | ICD-10-CM | POA: Diagnosis present

## 2018-05-02 DIAGNOSIS — Z885 Allergy status to narcotic agent status: Secondary | ICD-10-CM

## 2018-05-02 DIAGNOSIS — A419 Sepsis, unspecified organism: Secondary | ICD-10-CM | POA: Diagnosis present

## 2018-05-02 DIAGNOSIS — G4733 Obstructive sleep apnea (adult) (pediatric): Secondary | ICD-10-CM | POA: Diagnosis present

## 2018-05-02 DIAGNOSIS — I2699 Other pulmonary embolism without acute cor pulmonale: Secondary | ICD-10-CM | POA: Diagnosis present

## 2018-05-02 DIAGNOSIS — Z87891 Personal history of nicotine dependence: Secondary | ICD-10-CM

## 2018-05-02 DIAGNOSIS — I5032 Chronic diastolic (congestive) heart failure: Secondary | ICD-10-CM | POA: Diagnosis present

## 2018-05-02 DIAGNOSIS — M170 Bilateral primary osteoarthritis of knee: Secondary | ICD-10-CM | POA: Diagnosis present

## 2018-05-02 DIAGNOSIS — Z170 Estrogen receptor positive status [ER+]: Secondary | ICD-10-CM

## 2018-05-02 DIAGNOSIS — C787 Secondary malignant neoplasm of liver and intrahepatic bile duct: Secondary | ICD-10-CM | POA: Diagnosis present

## 2018-05-02 DIAGNOSIS — J45909 Unspecified asthma, uncomplicated: Secondary | ICD-10-CM | POA: Diagnosis present

## 2018-05-02 DIAGNOSIS — Z832 Family history of diseases of the blood and blood-forming organs and certain disorders involving the immune mechanism: Secondary | ICD-10-CM

## 2018-05-02 DIAGNOSIS — Z7951 Long term (current) use of inhaled steroids: Secondary | ICD-10-CM

## 2018-05-02 DIAGNOSIS — J189 Pneumonia, unspecified organism: Secondary | ICD-10-CM

## 2018-05-02 DIAGNOSIS — C50919 Malignant neoplasm of unspecified site of unspecified female breast: Secondary | ICD-10-CM | POA: Diagnosis present

## 2018-05-02 DIAGNOSIS — Z91040 Latex allergy status: Secondary | ICD-10-CM

## 2018-05-02 DIAGNOSIS — Z86711 Personal history of pulmonary embolism: Secondary | ICD-10-CM

## 2018-05-02 DIAGNOSIS — K5900 Constipation, unspecified: Secondary | ICD-10-CM | POA: Diagnosis present

## 2018-05-02 DIAGNOSIS — E785 Hyperlipidemia, unspecified: Secondary | ICD-10-CM | POA: Diagnosis present

## 2018-05-02 DIAGNOSIS — M160 Bilateral primary osteoarthritis of hip: Secondary | ICD-10-CM | POA: Diagnosis present

## 2018-05-02 LAB — COMPREHENSIVE METABOLIC PANEL
ALT: 22 U/L (ref 0–44)
AST: 43 U/L — AB (ref 15–41)
Albumin: 3.5 g/dL (ref 3.5–5.0)
Alkaline Phosphatase: 111 U/L (ref 38–126)
Anion gap: 9 (ref 5–15)
BUN: 12 mg/dL (ref 6–20)
CO2: 22 mmol/L (ref 22–32)
CREATININE: 0.78 mg/dL (ref 0.44–1.00)
Calcium: 9.4 mg/dL (ref 8.9–10.3)
Chloride: 101 mmol/L (ref 98–111)
GFR calc Af Amer: >60 mL/min (ref >60)
GFR calc non Af Amer: >60 mL/min (ref >60)
Glucose, Bld: 125 mg/dL — ABNORMAL HIGH (ref 70–99)
Potassium: 4.2 mmol/L (ref 3.5–5.1)
Sodium: 132 mmol/L — ABNORMAL LOW (ref 135–145)
Total Bilirubin: 1.6 mg/dL — ABNORMAL HIGH (ref 0.3–1.2)
Total Protein: 7.9 g/dL (ref 6.5–8.1)

## 2018-05-02 LAB — LIPASE, BLOOD: Lipase: 31 U/L (ref 11–51)

## 2018-05-02 LAB — CBC WITH DIFFERENTIAL/PLATELET
Abs Immature Granulocytes: 0.19 10*3/uL — ABNORMAL HIGH (ref 0.00–0.07)
Basophils Absolute: 0 10*3/uL (ref 0.0–0.1)
Basophils Relative: 1 %
Eosinophils Absolute: 0 10*3/uL (ref 0.0–0.5)
Eosinophils Relative: 1 %
HEMATOCRIT: 15.9 % — AB (ref 36.0–46.0)
Hemoglobin: 4.5 g/dL — CL (ref 12.0–15.0)
Immature Granulocytes: 5 %
Lymphocytes Relative: 14 %
Lymphs Abs: 0.5 10*3/uL — ABNORMAL LOW (ref 0.7–4.0)
MCH: 27.4 pg (ref 26.0–34.0)
MCHC: 28.3 g/dL — ABNORMAL LOW (ref 30.0–36.0)
MCV: 97 fL (ref 80.0–100.0)
MONO ABS: 0.4 10*3/uL (ref 0.1–1.0)
Monocytes Relative: 11 %
Neutro Abs: 2.4 10*3/uL (ref 1.7–7.7)
Neutrophils Relative %: 68 %
Platelets: 142 10*3/uL — ABNORMAL LOW (ref 150–400)
RBC: 1.64 MIL/uL — ABNORMAL LOW (ref 3.87–5.11)
RDW: 19.7 % — ABNORMAL HIGH (ref 11.5–15.5)
WBC: 3.6 10*3/uL — ABNORMAL LOW (ref 4.0–10.5)
nRBC: 6.7 % — ABNORMAL HIGH (ref 0.0–0.2)

## 2018-05-02 LAB — PROTIME-INR
INR: 1.26
Prothrombin Time: 15.7 seconds — ABNORMAL HIGH (ref 11.4–15.2)

## 2018-05-02 LAB — LACTIC ACID, PLASMA: Lactic Acid, Venous: 1.6 mmol/L (ref 0.5–1.9)

## 2018-05-02 LAB — TROPONIN I: Troponin I: <0.03 ng/mL (ref <0.03)

## 2018-05-02 LAB — I-STAT TROPONIN, ED: Troponin i, poc: 0.02 ng/mL (ref 0.00–0.08)

## 2018-05-02 MED ORDER — SODIUM CHLORIDE 0.9 % IV BOLUS
500.0000 mL | Freq: Once | INTRAVENOUS | Status: AC
  Administered 2018-05-02: 500 mL via INTRAVENOUS

## 2018-05-02 MED ORDER — VANCOMYCIN HCL 10 G IV SOLR
2000.0000 mg | Freq: Once | INTRAVENOUS | Status: AC
  Administered 2018-05-02: 2000 mg via INTRAVENOUS
  Filled 2018-05-02: qty 2000

## 2018-05-02 MED ORDER — LEVOFLOXACIN 500 MG PO TABS: 500.0000 mg | ORAL_TABLET | Freq: Every day | ORAL | 0 refills | Status: DC

## 2018-05-02 MED ORDER — SODIUM CHLORIDE 0.9 % IV SOLN
2.0000 g | INTRAVENOUS | Status: AC
  Administered 2018-05-02: 2 g via INTRAVENOUS
  Filled 2018-05-02: qty 2

## 2018-05-02 MED ORDER — ACETAMINOPHEN 325 MG PO TABS
325.0000 mg | ORAL_TABLET | Freq: Once | ORAL | Status: AC
  Administered 2018-05-02: 325 mg via ORAL
  Filled 2018-05-02: qty 1

## 2018-05-02 NOTE — ED Notes (Signed)
Date and time results received: 05/02/18 2338   Test: Hgb Critical Value:4.5  Name of Provider Notified: Mia McDonald EDPA  Orders Received? Or Actions Taken?: see chart

## 2018-05-02 NOTE — ED Notes (Signed)
Bed: PV66 Expected date:  Expected time:  Means of arrival:  Comments: EMS 58 yo female cancer patient/CHF/fever 102-Tylenol 4 hours ago HR 130 with frequent PVC's

## 2018-05-02 NOTE — ED Triage Notes (Signed)
Arrived via EMS from home. Patient is a breast cancer pt that spread down to bone, and she has been in and out of hospital for CA trt. -C/C fever, productive cough and constipation. Patient has been taking stool softener and laxatives to help with constipation, bowel sounds present per EMS, last BM per pt was 3-4 days ago, abdomen is distended. -Hx of CHF, L side restriction from mastectomy   Vitals  -98% on 2L [place] (what pt wears at home at all times) -HR 130, trigeminy   -BP 170/86 -102.6 oral temp, patient reports taking 600 mg of Tylenol @ 6:30 pm

## 2018-05-02 NOTE — ED Notes (Signed)
One set of blood cultures obtained from [facility].  

## 2018-05-02 NOTE — Progress Notes (Signed)
A consult was received from an ED physician for cefepime and vancomycin per pharmacy dosing.  The patient's profile has been reviewed for ht/wt/allergies/indication/available labs.   A one time order has been placed for Cefepime 2 Gm and Vancomycin 2 Gm.  Further antibiotics/pharmacy consults should be ordered by admitting physician if indicated.                       Thank you, Dorrene German 05/02/2018  11:00 PM

## 2018-05-02 NOTE — ED Provider Notes (Signed)
[facility] DEPT Provider Note   CSN: [id number] Arrival date & time: 05/02/18  2138     History   Chief Complaint No chief complaint on file.   HPI Michaela Little is a 58 y.o. female with a history of static breast cancer, PE on Lovenox, chronic respiratory failure on 2 L home O2 who is a Sales promotion account executive Witness presents to the emergency department with a chief complaint of fever.  She reports fever, onset yesterday.  T-max 10 2-1 03 at home.  She is on 2 L of home O2, but reports worsening shortness of breath and productive cough with yellow sputum over the last few days.  She also reports intermittent right upper back pain that she characterizes achy that began yesterday.  No known alleviating factors.  She also reports right lower quadrant pain that began over the last few days.  She is unable to characterize the pain.  She reports associated urinary frequency and constipation.  States she had one small bowel movement today, but has not had a normal bowel movement in the last week.  She continues to pass flatus.  She also endorses intermittent numbness in her bilateral hands that began yesterday.  Denies numbness in her bilateral hands and no numbness or weakness in her bilateral feet.  She was seen by Dr. Burr Medico with oncology yesterday and was started on fulvestrant, monthly B12, and continuing Aranesp every 2 weeks.  She spoke with oncology earlier today and was advised to come to the ER if her fever persists or symptoms worsen.  She was called in a prescription of Levaquin 500 mg daily, but has not started the medication.  She was admitted on January 30 and was treated for fever that was thought to be secondary to UTI, but cultures were negative and antibiotics were stopped.  Family reports that her pharmacy has not delivered any of her medications that were sent with her at discharge from her most recent admission.  She denies chest pain, rash, hematuria,  dysuria, melena, hematochezia, visual changes.   She is a full code.   The history is provided by the patient. No language interpreter was used.    Past Medical History:  Diagnosis Date  . Anxiety   . Arthritis    hands, knees, hips  . Asthma   . Bronchitis   . Cancer ([place])   . Cervical stenosis (uterine cervix)   . Disorder of appendix    Enlarged  . Dyspnea on exertion   . GERD (gastroesophageal reflux disease)   . Headache(784.0)   . History of breast cancer    2010--  LEFT  s/p  mastectomy (in [place]) AND CHEMORADIATION--  NO RECURRENCE  . History of cervical dysplasia   . Hyperlipidemia   . Hypertension   . Malignant neoplasm of overlapping sites of left breast in female, estrogen receptor positive ([place]) 03/05/2013  . OSA (obstructive sleep apnea) moderate osa per study  09/2010   CPAP  , NOT USING ON REGULAR BASIS  . Pelvic pain in female   . Positive H. pylori test    08-05-2013  . Positive TB test    AS TEEN--  [place] W/ MEDS  . Refusal of blood transfusions as patient is Jehovah's Witness   . Seasonal allergies   . Uterine fibroid   . Wears glasses     Patient Active Problem List   Diagnosis Date Noted  . Sepsis ([place]) 05/03/2018  . SIRS (systemic inflammatory response  syndrome) ([place]) 04/25/2018  . Hypercalcemia 04/18/2018  . Metastatic breast cancer ([place])   . Pulmonary embolism ([place]) 02/27/2018  . Acute respiratory failure with hypoxia ([place]) 01/04/2018  . Symptomatic anemia 01/02/2018  . Pancytopenia ([place]) 01/02/2018  . Patient is Jehovah's Witness   . Refusal of blood transfusions as patient is Jehovah's Witness   . High risk medication use 09/25/2017  . Lower extremity edema 09/25/2017  . S/P laparoscopic appendectomy 08/22/2017  . Goals of care, counseling/discussion 01/20/2017  . Multinodular thyroid 02/08/2016  . Lumbar back pain with radiculopathy affecting left lower extremity 02/08/2016  . Carcinoma of breast metastatic to bone ([place]) 02/08/2016  .  Bone metastases ([place]) 11/13/2015  . Right thyroid nodule 06/30/2014  . Allergic rhinitis due to pollen 06/30/2014  . Essential hypertension 12/24/2013  . Left ventricular diastolic dysfunction with preserved systolic function 62/69/4854  . Lump in neck 08/11/2013  . Abdominal pain, chronic, bilateral lower quadrant 08/05/2013  . Abdominal pain, epigastric 08/05/2013  . Morbid (severe) obesity due to excess calories ([place]) 06/23/2013  . GERD (gastroesophageal reflux disease) 03/25/2013  . Constipation 03/25/2013  . Other and unspecified hyperlipidemia 03/25/2013  . Dysuria 03/25/2013  . Routine general medical examination at a health care facility 03/25/2013  . Malignant neoplasm of overlapping sites of left breast in female, estrogen receptor positive ([place]) 03/05/2013  . Cough 10/28/2012  . Dyspnea on exertion 10/28/2012  . Chronic abdominal pain 09/02/2012  . Hyperlipidemia 11/13/2011  . Dysplasia of cervix, low grade (CIN 1) 08/22/2011  . Diastolic dysfunction 62/70/3500  . Influenza 03/04/2011  . Obesity 03/04/2011  . OSA (obstructive sleep apnea) 02/27/2011  . Asthma 02/08/2011    Past Surgical History:  Procedure Laterality Date  . APPENDECTOMY  y  . CARDIOVASCULAR STRESS TEST  10-29-2012   low risk perfusion study/  no significant reversibity/ ef 66%/  normal wall motion  . CERVICAL CONIZATION W/BX  2012   in  [place] N/A 05/14/2012   Procedure: LAPAROSCOPIC CHOLECYSTECTOMY;  Surgeon: Ralene Ok, MD;  Location: [facility];  Service: General;  Laterality: N/A;  . COLONOSCOPY  2011   normal per patient - [place]  . DILATION AND CURETTAGE OF UTERUS  10/15/2011   Procedure: DILATATION AND CURETTAGE;  Surgeon: Melina Schools, MD;  Location: [facility];  Service: Gynecology;  Laterality: N/A;  Conization &  endocervical curettings  . EXAMINATION UNDER ANESTHESIA N/A 08/20/2013   Procedure: EXAM UNDER ANESTHESIA;  Surgeon: Margarette Asal, MD;  Location: [facility];  Service: Gynecology;  Laterality: N/A;  . [place]   right  . IR IVC FILTER PLMT / S&I /IMG GUID/MOD SED  03/01/2018  . IR RADIOLOGIST EVAL & MGMT  04/08/2018  . LAPAROSCOPIC APPENDECTOMY N/A 08/22/2017   Procedure: [place];  Surgeon: Coralie Keens, MD;  Location: [facility];  Service: General;  Laterality: N/A;  . LAPAROSCOPIC ASSISTED VAGINAL HYSTERECTOMY N/A 10/26/2014   Procedure: HYSTERECTOMY ABDOMINAL ;  Surgeon: Molli Posey, MD;  Location: [facility];  Service: Gynecology;  Laterality: N/A;  . MASTECTOMY Left 11/2008  in [place]   . SALPINGOOPHORECTOMY Bilateral 10/26/2014   Procedure: BILATERAL SALPINGO OOPHORECTOMY;  Surgeon: Molli Posey, MD;  Location: [facility];  Service: Gynecology;  Laterality: Bilateral;  . TISSUE EXPANDER PLACEMENT Left 08/22/2017   Procedure: REMOVAL OF LEFT TISSUE EXPANDER;  Surgeon: Crissie Reese, MD;  Location: [facility];  Service: Plastics;  Laterality: Left;  .  TISSUE EXPANDER REMOVAL Left 08/22/2017  . TRANSTHORACIC ECHOCARDIOGRAM  10-29-2012   mild lvh/  ef 60-65%/  grade II diastolic dysfunction/  trivial mr  &  tr     OB History    Gravida  10   Para  5   Term  5   Preterm      AB  5   Living  5     SAB  3   TAB  2   Ectopic      Multiple      Live Births               Home Medications    Prior to Admission medications   Medication Sig Start Date End Date Taking? Authorizing Provider  acetaminophen (TYLENOL) 500 MG tablet Take 500 mg by mouth every 6 (six) hours as needed for mild pain.    [provider]  amLODipine (NORVASC) 10 MG tablet Take 1 tablet (10 mg total) by mouth daily. 04/22/18   Kayleen Memos, DO  cetirizine (ZYRTEC) 10 MG tablet Take 10 mg by mouth daily. 12/19/17   [provider]  docusate sodium (COLACE) 100 MG capsule Take 1 capsule (100 mg total) by mouth daily as needed for mild constipation. 04/28/18   Hosie Poisson, MD    enoxaparin (LOVENOX) 100 MG/ML injection Inject 1 mL (100 mg total) into the skin every 12 (twelve) hours. 05/01/18   [name], MD  feeding supplement, ENSURE ENLIVE, (ENSURE ENLIVE) LIQD Take 237 mLs by mouth 2 (two) times daily between meals. 04/28/18   Hosie Poisson, MD  fluticasone (FLONASE) 50 MCG/ACT nasal spray Place 1 spray into both nostrils daily. 04/29/18   Hosie Poisson, MD  fluticasone furoate-vilanterol (BREO ELLIPTA) 100-25 MCG/INH AEPB Inhale 1 puff into the lungs daily. Patient taking differently: Inhale 1 puff into the lungs daily as needed (sob and wheezing).  10/18/17   Martyn Ehrich, NP  folic acid (FOLVITE) 1 MG tablet Take 1 tablet (1 mg total) by mouth daily. 01/05/18   Geradine Girt, DO  hydrALAZINE (APRESOLINE) 10 MG tablet Take 1 tablet (10 mg total) by mouth 3 (three) times daily. 04/22/18   Kayleen Memos, DO  ipratropium-albuterol (DUONEB) 0.5-2.5 (3) MG/3ML SOLN INHALE THREE MLS VIA NEBULIZER EVERY 6 HOURS AS NEEDED Patient taking differently: Take 3 mLs by nebulization every 6 (six) hours as needed (for wheezing or shortness of breath).  02/13/18   Clent Demark, PA-C  lactulose (CHRONULAC) 10 GM/15ML solution Take 15-30 mLs (10-20 g total) by mouth 3 (three) times daily. 05/01/18   [name], MD  levofloxacin (LEVAQUIN) 500 MG tablet Take 1 tablet (500 mg total) by mouth daily. 05/02/18   [name], MD  Multiple Vitamins-Minerals (MULTIVITAMIN ADULT PO) Take 1 tablet by mouth daily.    [provider]  polyethylene glycol (MIRALAX / GLYCOLAX) packet Take 17 g by mouth daily. 04/22/18   Kayleen Memos, DO  traMADol (ULTRAM) 50 MG tablet Take 2 tablets (100 mg total) by mouth every 6 (six) hours. Patient taking differently: Take 100 mg by mouth every 6 (six) hours as needed for moderate pain or severe pain.  03/08/18   Cristal Ford, DO  vitamin B-12 1000 MCG tablet Take 1 tablet (1,000 mcg total) by mouth daily. 01/05/18   Geradine Girt, DO    Family  History Family History  Problem Relation Age of Onset  . Breast cancer Mother   . Colon cancer  Mother   . Hypotension Mother   . Asthma Mother   . Diabetes type II Mother   . Arthritis Mother   . Clotting disorder Mother   . Cancer Mother        breast/colon  . Mental illness Brother   . Heart disease Brother   . Cerebral palsy Daughter   . Emphysema Brother        never smoker  . Colon cancer Maternal Aunt   . Cancer Maternal Aunt        colon  . Colon cancer Maternal Uncle   . Cancer Maternal Uncle        colon  . Esophageal cancer Neg Hx   . Rectal cancer Neg Hx   . Stomach cancer Neg Hx   . Thyroid disease Neg Hx     Social History Social History   Tobacco Use  . Smoking status: Former Smoker    Packs/day: 0.30    Years: 10.00    Pack years: 3.00    Types: Cigarettes    Last attempt to quit: 03/26/1988    Years since quitting: 30.1  . Smokeless tobacco: Never Used  Substance Use Topics  . Alcohol use: No  . Drug use: No     Allergies   Lisinopril-hydrochlorothiazide; Adhesive [tape]; Fentanyl; Gabapentin; Hctz [hydrochlorothiazide]; Latex; Lisinopril; Losartan potassium; Other; Oxycodone; and Tums [calcium carbonate antacid]   Review of Systems Review of Systems  Constitutional: Positive for fever. Negative for activity change.  HENT: Negative for congestion, sore throat and voice change.   Eyes: Negative for photophobia and visual disturbance.  Respiratory: Positive for cough and shortness of breath. Negative for apnea and choking.   Cardiovascular: Negative for chest pain.  Gastrointestinal: Positive for abdominal pain and constipation. Negative for diarrhea, nausea and vomiting.  Genitourinary: Positive for frequency. Negative for dysuria.  Musculoskeletal: Negative for back pain, neck pain and neck stiffness.  Skin: Negative for rash.  Allergic/Immunologic: Negative for immunocompromised state.  Neurological: Positive for numbness. Negative for  dizziness, syncope, weakness and headaches.  Psychiatric/Behavioral: Negative for confusion.   Physical Exam Updated Vital Signs BP (!) 190/120 (BP Location: Right Arm)   Pulse 81   Temp (!) 103.1 F (39.5 C) (Oral)   Resp 18   Ht 5\' 7"  (1.702 m)   Wt 106.5 kg   LMP 02/25/2009   SpO2 95%   BMI 36.79 kg/m   Physical Exam Vitals signs and nursing note reviewed.  Constitutional:      General: She is not in acute distress.    Comments: Chronically ill-appearing female.  She appears uncomfortable. [place] in place.   HENT:     Head: Normocephalic.     Nose: No congestion.  Eyes:     Extraocular Movements: Extraocular movements intact.     Conjunctiva/sclera: Conjunctivae normal.     Pupils: Pupils are equal, round, and reactive to light.  Neck:     Musculoskeletal: Neck supple.  Cardiovascular:     Rate and Rhythm: Regular rhythm. Tachycardia present.     Heart sounds: No murmur. No friction rub. No gallop.   Pulmonary:     Effort: Pulmonary effort is normal. No respiratory distress.     Comments: Coarse breath sounds in the bilateral bases and right middle lobe.  Abdominal:     General: There is distension.     Palpations: Abdomen is soft. There is no mass.     Tenderness: There is abdominal tenderness. There is no left  CVA tenderness, guarding or rebound.     Hernia: No hernia is present.     Comments: Abdomen is mildly distended and obese, but soft.  Tender to palpation in the bilateral lower abdomen and left upper quadrant.   Skin:    General: Skin is warm.     Capillary Refill: Capillary refill takes less than 2 seconds.     Findings: No rash.  Neurological:     Mental Status: She is alert.     Comments: Moves all 4 extremities.  Sensation is intact and equal to the bilateral upper and lower extremities.  Psychiatric:        Behavior: Behavior normal.      ED Treatments / Results  Labs (all labs ordered are listed, but only abnormal results are displayed) Labs  Reviewed  COMPREHENSIVE METABOLIC PANEL - Abnormal; Notable for the following components:      Result Value   Sodium 132 (*)    Glucose, Bld 125 (*)    AST 43 (*)    Total Bilirubin 1.6 (*)    All other components within normal limits  CBC WITH DIFFERENTIAL/PLATELET - Abnormal; Notable for the following components:   WBC 3.6 (*)    RBC 1.64 (*)    Hemoglobin 4.5 (*)    HCT 15.9 (*)    MCHC 28.3 (*)    RDW 19.7 (*)    Platelets 142 (*)    nRBC 6.7 (*)    Lymphs Abs 0.5 (*)    Abs Immature Granulocytes 0.19 (*)    All other components within normal limits  PROTIME-INR - Abnormal; Notable for the following components:   Prothrombin Time 15.7 (*)    All other components within normal limits  CULTURE, BLOOD (ROUTINE X 2)  CULTURE, BLOOD (ROUTINE X 2)  URINE CULTURE  LACTIC ACID, PLASMA  LIPASE, BLOOD  BRAIN NATRIURETIC PEPTIDE  TROPONIN I  URINALYSIS, ROUTINE W REFLEX MICROSCOPIC  INFLUENZA PANEL BY PCR (TYPE A & B)  I-STAT TROPONIN, ED    EKG  EKG Interpretation  Date/Time: 05/02/2018 22:01   Ventricular Rate:  109  PR Interval:   131 QRS Duration:  79 QT Interval:   303 QTC Calculation:  408 R Axis:    26 Text Interpretation: Sinus tachycardia.  PVCs.        Radiology Dg Chest 2 View  Result Date: 05/02/2018 CLINICAL DATA:  Initial evaluation for acute fever, productive cough. EXAM: CHEST - 2 VIEW COMPARISON:  Prior radiograph from 04/25/2018 FINDINGS: Cardiac and mediastinal silhouettes are stable in size and contour, and remain within normal limits. Lungs normally inflated. Patchy infiltrate overlies the mid right lung base, concerning for infiltrate given the provided history of cough and fever. Few scattered additional opacities at the left lung base as well. No edema or effusion. No pneumothorax. Surgical clips overlie the left axilla.  No acute osseous finding. IMPRESSION: Scattered multifocal patchy bibasilar opacities, suspicious for possible infectious  infiltrates given provided history of cough and fever. Electronically Signed   By: Jeannine Boga M.D.   On: 05/02/2018 23:25   Dg Abdomen 1 View  Result Date: 05/02/2018 CLINICAL DATA:  Initial evaluation for constipation. EXAM: ABDOMEN - 1 VIEW COMPARISON:  Prior CT from 04/02/2018 FINDINGS: Bowel gas pattern is nonobstructive. No abnormal bowel wall thickening. Large volume retained stool within the rectal vault/distal rectosigmoid colon. No abnormal calcification. IVC filter and cholecystectomy clips noted. Visualized osseous structures demonstrate a diffusely mottled and sclerotic appearance, compatible with  known widespread osseous metastatic disease. IMPRESSION: 1. Large volume retained stool within the distal rectosigmoid colon/rectal vault, compatible with constipation. 2. 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 Electronically Signed   By: Jeannine Boga M.D.   On: 05/02/2018 23:27    Procedures .Critical Care Performed by: Joanne Gavel, PA-C Authorized by: Joanne Gavel, PA-C   Critical care provider statement:    Critical care time (minutes):  50   Critical care time was exclusive of:  Separately billable procedures and treating other patients and teaching time   Critical care was necessary to treat or prevent imminent or life-threatening deterioration of the following conditions:  Sepsis   Critical care was time spent personally by me on the following activities:  Review of old charts, ordering and review of radiographic studies, ordering and review of laboratory studies, examination of patient, ordering and performing treatments and interventions, pulse oximetry, re-evaluation of patient's condition, obtaining history from patient or surrogate, discussions with consultants and development of treatment plan with patient or surrogate   (including critical care time)  Medications Ordered in  ED Medications  vancomycin (VANCOCIN) 2,000 mg in sodium chloride 0.9 % 500 mL IVPB (2,000 mg Intravenous New Bag/Given 05/02/18 2349)  acetaminophen (TYLENOL) tablet 325 mg (325 mg Oral Given 05/02/18 2224)  ceFEPIme (MAXIPIME) 2 g in sodium chloride 0.9 % 100 mL IVPB (0 g Intravenous Stopped 05/02/18 2350)  sodium chloride 0.9 % bolus 500 mL (500 mLs Intravenous New Bag/Given 05/02/18 2315)     Initial Impression / Assessment and Plan / ED Course  I have reviewed the triage vital signs and the nursing notes.  Pertinent labs & imaging results that were available during my care of the patient were reviewed by me and considered in my medical decision making (see chart for details).     58 year old female with a history of static breast cancer, PE on Lovenox, chronic respiratory failure on 2 L home O2 presenting with fever for the last 2 days with worsening shortness of breath, productive cough, numbness in her bilateral hands that is been intermittent, right upper back pain, bilateral lower abdominal pain, and constipation.  She is passing flatus.  No history of SBO and her only surgical history is hysterectomy.  The patient was seen and evaluated along with Dr. Alvino Chapel, attending physician.  Per chart review, she spoke with her oncology team earlier today and was called in a prescription of antibiotics, but she has not yet initiated these.  Was told to go to the ER for new or worsening symptoms.  On arrival, she is febrile, tachypneic, and tachycardic.  EKG with PVCs and sinus tachycardia.  Troponin is unremarkable.  WBC is 3.6.  Hemoglobin is 4.5, increased from 3.9.  She is a Sales promotion account executive Witness and declines all blood products.  Platelet count is 142.  Mild hyponatremia 132.  Lactate is normal.  Patient meets criteria and code sepsis was initiated.  Will defer 30 cc/kg fluid bolus at this time as the patient has a history of heart failure.  She was given 500 cc of fluids in the ER and started on  cefepime and vancomycin.  Chest x-ray with bibasilar opacities and a coarse breath sounds in the right midlung, concerning for pneumonia.  Abdominal x-ray consistent with constipation.  Consult to the hospitalist team and spoke with Dr. Hal Hope who will accept the patient for admission.  Dr. Marin Olp with oncology was also consulted and  oncology will follow.  The patient appears reasonably stabilized for admission considering the current resources, flow, and capabilities available in the ED at this time, and I doubt any other [facility] requiring further screening and/or treatment in the ED prior to admission.  Final Clinical Impressions(s) / ED Diagnoses   Final diagnoses:  Multifocal pneumonia  Constipation, unspecified constipation type    ED Discharge Orders    None       Joanne Gavel, PA-C 05/03/18 0044    Davonna Belling, MD 05/05/18 [phone]

## 2018-05-02 NOTE — Telephone Encounter (Signed)
Patient called in this morning to report fever with temperature 102 this morning, no significant chills, or worsening cough or other new symptoms.  She took a Tylenol, temperature is coming down.  She feels about same as yesterday.  She has had intermittent fever for the past month, possible related to her malignancy.  However due to mild leukopenia, I will call in antibiotics Levaquin 500 mg daily for 5 days, I recommend her to consider going to emergency room if she has persistent fever or worsening symptoms over the weekend.  She agrees with the plan.  [name]  05/02/2018

## 2018-05-02 NOTE — ED Notes (Signed)
Patient aware of urine sample.

## 2018-05-02 NOTE — ED Notes (Signed)
Patient requesting humidification for her oxygen-applied

## 2018-05-03 ENCOUNTER — Other Ambulatory Visit: Payer: Self-pay

## 2018-05-03 ENCOUNTER — Encounter (HOSPITAL_COMMUNITY): Payer: Self-pay | Admitting: Internal Medicine

## 2018-05-03 ENCOUNTER — Inpatient Hospital Stay (HOSPITAL_COMMUNITY): Payer: Medicare Other

## 2018-05-03 DIAGNOSIS — E440 Moderate protein-calorie malnutrition: Secondary | ICD-10-CM | POA: Diagnosis present

## 2018-05-03 DIAGNOSIS — Z170 Estrogen receptor positive status [ER+]: Secondary | ICD-10-CM | POA: Diagnosis not present

## 2018-05-03 DIAGNOSIS — R509 Fever, unspecified: Secondary | ICD-10-CM | POA: Diagnosis present

## 2018-05-03 DIAGNOSIS — A419 Sepsis, unspecified organism: Secondary | ICD-10-CM | POA: Diagnosis not present

## 2018-05-03 DIAGNOSIS — M19041 Primary osteoarthritis, right hand: Secondary | ICD-10-CM | POA: Diagnosis present

## 2018-05-03 DIAGNOSIS — M19042 Primary osteoarthritis, left hand: Secondary | ICD-10-CM | POA: Diagnosis present

## 2018-05-03 DIAGNOSIS — C787 Secondary malignant neoplasm of liver and intrahepatic bile duct: Secondary | ICD-10-CM | POA: Diagnosis present

## 2018-05-03 DIAGNOSIS — Z7189 Other specified counseling: Secondary | ICD-10-CM | POA: Diagnosis not present

## 2018-05-03 DIAGNOSIS — Z515 Encounter for palliative care: Secondary | ICD-10-CM | POA: Diagnosis not present

## 2018-05-03 DIAGNOSIS — J111 Influenza due to unidentified influenza virus with other respiratory manifestations: Secondary | ICD-10-CM | POA: Diagnosis not present

## 2018-05-03 DIAGNOSIS — C50919 Malignant neoplasm of unspecified site of unspecified female breast: Secondary | ICD-10-CM | POA: Diagnosis not present

## 2018-05-03 DIAGNOSIS — D61818 Other pancytopenia: Secondary | ICD-10-CM

## 2018-05-03 DIAGNOSIS — I110 Hypertensive heart disease with heart failure: Secondary | ICD-10-CM | POA: Diagnosis present

## 2018-05-03 DIAGNOSIS — Z9012 Acquired absence of left breast and nipple: Secondary | ICD-10-CM | POA: Diagnosis not present

## 2018-05-03 DIAGNOSIS — M170 Bilateral primary osteoarthritis of knee: Secondary | ICD-10-CM | POA: Diagnosis present

## 2018-05-03 DIAGNOSIS — I5032 Chronic diastolic (congestive) heart failure: Secondary | ICD-10-CM | POA: Diagnosis present

## 2018-05-03 DIAGNOSIS — E785 Hyperlipidemia, unspecified: Secondary | ICD-10-CM | POA: Diagnosis present

## 2018-05-03 DIAGNOSIS — Z9981 Dependence on supplemental oxygen: Secondary | ICD-10-CM | POA: Diagnosis not present

## 2018-05-03 DIAGNOSIS — I82411 Acute embolism and thrombosis of right femoral vein: Secondary | ICD-10-CM | POA: Diagnosis present

## 2018-05-03 DIAGNOSIS — K219 Gastro-esophageal reflux disease without esophagitis: Secondary | ICD-10-CM | POA: Diagnosis present

## 2018-05-03 DIAGNOSIS — Z789 Other specified health status: Secondary | ICD-10-CM | POA: Diagnosis not present

## 2018-05-03 DIAGNOSIS — G4733 Obstructive sleep apnea (adult) (pediatric): Secondary | ICD-10-CM | POA: Diagnosis present

## 2018-05-03 DIAGNOSIS — A403 Sepsis due to Streptococcus pneumoniae: Secondary | ICD-10-CM | POA: Diagnosis not present

## 2018-05-03 DIAGNOSIS — Z86711 Personal history of pulmonary embolism: Secondary | ICD-10-CM | POA: Diagnosis not present

## 2018-05-03 DIAGNOSIS — R6520 Severe sepsis without septic shock: Secondary | ICD-10-CM | POA: Diagnosis not present

## 2018-05-03 DIAGNOSIS — Z853 Personal history of malignant neoplasm of breast: Secondary | ICD-10-CM | POA: Diagnosis not present

## 2018-05-03 DIAGNOSIS — M160 Bilateral primary osteoarthritis of hip: Secondary | ICD-10-CM | POA: Diagnosis present

## 2018-05-03 DIAGNOSIS — Z825 Family history of asthma and other chronic lower respiratory diseases: Secondary | ICD-10-CM | POA: Diagnosis not present

## 2018-05-03 DIAGNOSIS — C7951 Secondary malignant neoplasm of bone: Secondary | ICD-10-CM | POA: Diagnosis present

## 2018-05-03 DIAGNOSIS — J101 Influenza due to other identified influenza virus with other respiratory manifestations: Secondary | ICD-10-CM | POA: Diagnosis present

## 2018-05-03 DIAGNOSIS — A4189 Other specified sepsis: Secondary | ICD-10-CM | POA: Diagnosis present

## 2018-05-03 DIAGNOSIS — J9610 Chronic respiratory failure, unspecified whether with hypoxia or hypercapnia: Secondary | ICD-10-CM | POA: Diagnosis present

## 2018-05-03 DIAGNOSIS — J45909 Unspecified asthma, uncomplicated: Secondary | ICD-10-CM | POA: Diagnosis present

## 2018-05-03 DIAGNOSIS — R609 Edema, unspecified: Secondary | ICD-10-CM | POA: Diagnosis not present

## 2018-05-03 DIAGNOSIS — I82409 Acute embolism and thrombosis of unspecified deep veins of unspecified lower extremity: Secondary | ICD-10-CM | POA: Diagnosis not present

## 2018-05-03 DIAGNOSIS — K5900 Constipation, unspecified: Secondary | ICD-10-CM | POA: Diagnosis not present

## 2018-05-03 LAB — INFLUENZA PANEL BY PCR (TYPE A & B)
Influenza A By PCR: POSITIVE — AB
Influenza B By PCR: NEGATIVE

## 2018-05-03 LAB — URINALYSIS, ROUTINE W REFLEX MICROSCOPIC
Bilirubin Urine: NEGATIVE
Glucose, UA: NEGATIVE mg/dL
Hgb urine dipstick: NEGATIVE
Ketones, ur: NEGATIVE mg/dL
Leukocytes, UA: NEGATIVE
Nitrite: NEGATIVE
Protein, ur: NEGATIVE mg/dL
Specific Gravity, Urine: 1.014 (ref 1.005–1.030)
pH: 6 (ref 5.0–8.0)

## 2018-05-03 LAB — LACTIC ACID, PLASMA
Lactic Acid, Venous: 1.3 mmol/L (ref 0.5–1.9)
Lactic Acid, Venous: 1.2 mmol/L (ref 0.5–1.9)

## 2018-05-03 LAB — MRSA PCR SCREENING: MRSA by PCR: NEGATIVE

## 2018-05-03 LAB — BRAIN NATRIURETIC PEPTIDE: B Natriuretic Peptide: 39.3 pg/mL (ref 0.0–100.0)

## 2018-05-03 MED ORDER — AMLODIPINE BESYLATE 5 MG PO TABS
10.0000 mg | ORAL_TABLET | Freq: Every day | ORAL | Status: DC
  Administered 2018-05-03 – 2018-05-08 (×5): 10 mg via ORAL
  Filled 2018-05-03 (×6): qty 2

## 2018-05-03 MED ORDER — IBUPROFEN 200 MG PO TABS
600.0000 mg | ORAL_TABLET | Freq: Three times a day (TID) | ORAL | Status: DC | PRN
  Administered 2018-05-03: 600 mg via ORAL
  Filled 2018-05-03: qty 3

## 2018-05-03 MED ORDER — IPRATROPIUM-ALBUTEROL 0.5-2.5 (3) MG/3ML IN SOLN: 3.0000 mL | Freq: Four times a day (QID) | RESPIRATORY_TRACT | Status: DC | PRN

## 2018-05-03 MED ORDER — VANCOMYCIN HCL 10 G IV SOLR
1250.0000 mg | Freq: Two times a day (BID) | INTRAVENOUS | Status: DC
  Administered 2018-05-03 – 2018-05-04 (×4): 1250 mg via INTRAVENOUS
  Filled 2018-05-03 (×5): qty 1250

## 2018-05-03 MED ORDER — ACETAMINOPHEN 325 MG PO TABS
650.0000 mg | ORAL_TABLET | Freq: Four times a day (QID) | ORAL | Status: DC | PRN
  Administered 2018-05-03 – 2018-05-04 (×3): 650 mg via ORAL
  Filled 2018-05-03 (×3): qty 2

## 2018-05-03 MED ORDER — DOCUSATE SODIUM 100 MG PO CAPS: 100.0000 mg | ORAL_CAPSULE | Freq: Every day | ORAL | Status: DC | PRN

## 2018-05-03 MED ORDER — ONDANSETRON HCL 4 MG/2ML IJ SOLN: 4.0000 mg | Freq: Four times a day (QID) | INTRAMUSCULAR | Status: DC | PRN

## 2018-05-03 MED ORDER — FLUTICASONE PROPIONATE 50 MCG/ACT NA SUSP
1.0000 | Freq: Every day | NASAL | Status: DC
  Administered 2018-05-03 – 2018-05-08 (×6): 1 via NASAL
  Filled 2018-05-03: qty 16

## 2018-05-03 MED ORDER — ACETAMINOPHEN 500 MG PO TABS
500.0000 mg | ORAL_TABLET | Freq: Once | ORAL | Status: AC
  Administered 2018-05-03: 500 mg via ORAL
  Filled 2018-05-03: qty 1

## 2018-05-03 MED ORDER — ENSURE ENLIVE PO LIQD: 237.0000 mL | Freq: Two times a day (BID) | ORAL | Status: DC

## 2018-05-03 MED ORDER — SODIUM CHLORIDE 0.9 % IV SOLN
1.0000 g | Freq: Three times a day (TID) | INTRAVENOUS | Status: DC
  Administered 2018-05-03 – 2018-05-05 (×6): 1 g via INTRAVENOUS
  Filled 2018-05-03 (×8): qty 1

## 2018-05-03 MED ORDER — POLYETHYLENE GLYCOL 3350 17 G PO PACK
17.0000 g | PACK | Freq: Every day | ORAL | Status: DC
  Administered 2018-05-03: 17 g via ORAL
  Filled 2018-05-03 (×3): qty 1

## 2018-05-03 MED ORDER — HYDRALAZINE HCL 10 MG PO TABS
10.0000 mg | ORAL_TABLET | Freq: Three times a day (TID) | ORAL | Status: DC
  Administered 2018-05-03 – 2018-05-04 (×5): 10 mg via ORAL
  Filled 2018-05-03 (×6): qty 1

## 2018-05-03 MED ORDER — SODIUM CHLORIDE 0.9 % IV SOLN
INTRAVENOUS | Status: AC
  Administered 2018-05-03: 04:00:00 via INTRAVENOUS

## 2018-05-03 MED ORDER — POLYVINYL ALCOHOL 1.4 % OP SOLN
1.0000 [drp] | OPHTHALMIC | Status: DC | PRN
  Filled 2018-05-03: qty 15

## 2018-05-03 MED ORDER — TRAMADOL HCL 50 MG PO TABS
100.0000 mg | ORAL_TABLET | Freq: Four times a day (QID) | ORAL | Status: DC | PRN
  Administered 2018-05-05: 100 mg via ORAL
  Filled 2018-05-03: qty 2

## 2018-05-03 MED ORDER — ENOXAPARIN SODIUM 100 MG/ML ~~LOC~~ SOLN
100.0000 mg | Freq: Two times a day (BID) | SUBCUTANEOUS | Status: DC
  Administered 2018-05-03 – 2018-05-05 (×6): 100 mg via SUBCUTANEOUS
  Filled 2018-05-03 (×6): qty 1

## 2018-05-03 MED ORDER — ACETAMINOPHEN 325 MG PO TABS
325.0000 mg | ORAL_TABLET | Freq: Once | ORAL | Status: AC
  Administered 2018-05-03: 325 mg via ORAL
  Filled 2018-05-03: qty 1

## 2018-05-03 MED ORDER — VITAMIN B-12 1000 MCG PO TABS
1000.0000 ug | ORAL_TABLET | Freq: Every day | ORAL | Status: DC
  Administered 2018-05-03 – 2018-05-08 (×6): 1000 ug via ORAL
  Filled 2018-05-03 (×6): qty 1

## 2018-05-03 MED ORDER — ONDANSETRON HCL 4 MG PO TABS
4.0000 mg | ORAL_TABLET | Freq: Four times a day (QID) | ORAL | Status: DC | PRN
  Administered 2018-05-03: 4 mg via ORAL
  Filled 2018-05-03: qty 1

## 2018-05-03 MED ORDER — OSELTAMIVIR PHOSPHATE 75 MG PO CAPS
75.0000 mg | ORAL_CAPSULE | Freq: Two times a day (BID) | ORAL | Status: AC
  Administered 2018-05-03 – 2018-05-07 (×10): 75 mg via ORAL
  Filled 2018-05-03 (×12): qty 1

## 2018-05-03 MED ORDER — ALUM & MAG HYDROXIDE-SIMETH 200-200-20 MG/5ML PO SUSP
15.0000 mL | ORAL | Status: DC | PRN
  Administered 2018-05-03 – 2018-05-06 (×2): 15 mL via ORAL
  Filled 2018-05-03 (×2): qty 30

## 2018-05-03 MED ORDER — ACETAMINOPHEN 650 MG RE SUPP: 650.0000 mg | Freq: Four times a day (QID) | RECTAL | Status: DC | PRN

## 2018-05-03 MED ORDER — LORATADINE 10 MG PO TABS
10.0000 mg | ORAL_TABLET | Freq: Every day | ORAL | Status: DC
  Administered 2018-05-03 – 2018-05-04 (×2): 10 mg via ORAL
  Filled 2018-05-03 (×3): qty 1

## 2018-05-03 MED ORDER — LACTULOSE 10 GM/15ML PO SOLN
10.0000 g | Freq: Three times a day (TID) | ORAL | Status: DC
  Administered 2018-05-03 – 2018-05-05 (×5): 20 g via ORAL
  Filled 2018-05-03 (×7): qty 30

## 2018-05-03 MED ORDER — FOLIC ACID 1 MG PO TABS
1.0000 mg | ORAL_TABLET | Freq: Every day | ORAL | Status: DC
  Administered 2018-05-03 – 2018-05-08 (×6): 1 mg via ORAL
  Filled 2018-05-03 (×6): qty 1

## 2018-05-03 NOTE — Progress Notes (Addendum)
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  States she is feeling much better.  Still febrile with a temperature of 103.1 this morning and currently 100.8  Assessment/Plan: Principal Problem:   Sepsis ([place]) Active Problems:   OSA (obstructive sleep apnea)   Influenza   Pancytopenia ([facility])   Patient is Jehovah's Witness   Pulmonary embolism ([place])   Metastatic breast cancer ([place])  1.  Sepsis sepsis protocol in place patient is on cefepime and vancomycin continue to monitor  2.  Pancytopenia with severe anemia of 4.6 patient is Jehovah witness and refuses blood transfusion.  Plan is to minimize blood draws and use pediatric tubes only  3.  Hypertension continue amlodipine and hydralazine  4.  History of diastolic CHF presently receiving free due to sure picture but will monitor closely  5.  History of metastatic cancer patient stated that she is a 10-year survivor she is followed up by Dr. Burr Medico oncologist   Code Status: Full  Severity of Illness: The appropriate patient status for this patient is INPATIENT. Inpatient status is judged to be reasonable and necessary in order to provide the required intensity of service to ensure the patient's safety. The patient's presenting symptoms, physical exam findings, and initial radiographic and laboratory data in the context of their chronic comorbidities is felt to place them at high risk for further clinical deterioration. Furthermore, it is not anticipated that the patient will be medically stable for discharge  from the hospital within 2 midnights of admission. The following factors support the patient status of inpatient.   " The patient's presenting symptoms include respiratory distress. " The worrisome physical exam findings include respiratory distress fever. " The initial radiographic and laboratory data are worrisome because of influenza. " The chronic co-morbidities include pulmonary embolism.   * I certify that at the point of admission it is my clinical judgment that the patient will require inpatient hospital care spanning beyond 2 midnights from the point of admission due to high intensity of service, high risk for further deterioration and high frequency of surveillance required.*    Family Communication: None at bedside  Disposition Plan: Home when stable   Consultants:  None  Procedures:  None  Antimicrobials:  Cefepime  Vancomycin  DVT prophylaxis: Lovenox   Objective: Vitals:   05/03/18 0349 05/03/18 0525 05/03/18 0543 05/03/18 0751  BP: (!) 157/81  126/71 129/68  Pulse: 94  100 93  Resp: (!) 25  20 16   Temp: (!) 103.1 F (39.5 C) (!) 100.6 F (38.1 C) (!) 100.6 F (38.1 C) (!) 100.4 F (38 C)  TempSrc: Oral Oral Oral Oral  SpO2: 100%  100% 100%  Weight:      Height:        Intake/Output Summary (Last 24 hours) at 05/03/2018 0959 Last data filed at 05/03/2018 0752 Gross per 24 hour  Intake 1102.69 ml  Output 250 ml  Net 852.69 ml   Filed Weights   05/02/18 2152  Weight: 106.5 kg   Body mass index is 36.79 kg/m.  Exam:  . General: 58 y.o. year-old female well developed well nourished  in no acute distress.  Alert and oriented x3. . Cardiovascular: Regular rate and rhythm with no rubs or gallops.  No thyromegaly or JVD noted.   Marland Kitchen Respiratory: Clear to auscultation with no wheezes or rales. Good inspiratory effort. . Abdomen: Soft nontender nondistended with normal bowel sounds x4 quadrants. . Musculoskeletal: No lower extremity edema. 2/4  pulses in all 4 extremities. . Skin: No ulcerative lesions noted or rashes, . Psychiatry: Mood is appropriate for condition and setting    Data Reviewed: CBC: Recent Labs  Lab 05/02/18 2221  WBC 3.6*  NEUTROABS 2.4  HGB 4.5*  HCT 15.9*  MCV 97.0  PLT 782*   Basic Metabolic Panel: Recent Labs  Lab 05/02/18 2221  NA 132*  K 4.2  CL 101  CO2 22  GLUCOSE 125*  BUN 12  CREATININE 0.78  CALCIUM 9.4   GFR: Estimated Creatinine Clearance: 94 mL/min (by C-G formula based on SCr of 0.78 mg/dL). Liver Function Tests: Recent Labs  Lab 04/28/18 0447 05/02/18 2221  AST 23 43*  ALT 16 22  ALKPHOS 93 111  BILITOT 0.2* 1.6*  PROT 6.3* 7.9  ALBUMIN 2.6* 3.5   Recent Labs  Lab 05/02/18 2221  LIPASE 31   No results for input(s): AMMONIA in the last 168 hours. Coagulation Profile: Recent Labs  Lab 05/02/18 2221  INR 1.26   Cardiac Enzymes: Recent Labs  Lab 05/02/18 2221  TROPONINI <0.03   BNP (last 3 results) No results for input(s): PROBNP in the last 8760 hours. HbA1C: No results for input(s): HGBA1C in the last 72 hours. CBG: No results for input(s): GLUCAP in the last 168 hours. Lipid Profile: No results for input(s): CHOL, HDL, LDLCALC, TRIG, CHOLHDL, LDLDIRECT in the last 72 hours. Thyroid Function Tests: No results for input(s): TSH, T4TOTAL, FREET4, T3FREE, THYROIDAB in the last 72 hours. Anemia Panel: No results for input(s): VITAMINB12, FOLATE, FERRITIN, TIBC, IRON, RETICCTPCT in the last 72 hours. Urine analysis:    Component Value Date/Time   COLORURINE YELLOW 05/03/2018 0044   APPEARANCEUR CLEAR 05/03/2018 0044   LABSPEC 1.014 05/03/2018 0044   LABSPEC 1.025 01/11/2016 1233   PHURINE 6.0 05/03/2018 0044   GLUCOSEU NEGATIVE 05/03/2018 0044   GLUCOSEU Negative 01/11/2016 1233   HGBUR NEGATIVE 05/03/2018 0044   BILIRUBINUR NEGATIVE 05/03/2018 0044   BILIRUBINUR small 12/03/2016 1117   BILIRUBINUR Negative 01/11/2016 1233   KETONESUR  NEGATIVE 05/03/2018 0044   PROTEINUR NEGATIVE 05/03/2018 0044   UROBILINOGEN 1.0 12/03/2016 1117   UROBILINOGEN 1.0 09/13/2016 1306   UROBILINOGEN 0.2 01/11/2016 1233   NITRITE NEGATIVE 05/03/2018 0044   LEUKOCYTESUR NEGATIVE 05/03/2018 0044   LEUKOCYTESUR Small 01/11/2016 1233   Sepsis Labs: @LABRCNTIP (procalcitonin:4,lacticidven:4)  ) Recent Results (from the past 240 hour(s))  Urine culture     Status: Abnormal   Collection Time: 04/25/18  4:12 AM  Result Value Ref Range Status   Specimen Description   Final    URINE, CLEAN CATCH Performed at [facility], [place] [address], [place], [place] [address]    Special Requests   Final    NONE Performed at [facility], [place] [address] [place], [place] [address]    Culture (A)  Final    <10,000 COLONIES/mL INSIGNIFICANT GROWTH Performed at [facility] [address] [place], [place] [address]    Report Status 04/26/2018 FINAL  Final  Blood culture (routine x 2)     Status: None   Collection Time: 04/25/18  5:30 AM  Result Value Ref Range Status   Specimen Description   Final    BLOOD RIGHT HAND Performed at [facility] [address], [place], [place] [address]    Special Requests   Final    BOTTLES DRAWN AEROBIC AND ANAEROBIC Blood Culture results may not be optimal due to an excessive volume of blood received in culture bottles Performed at [facility] [address] [place], [place] [address]    Culture   Final    NO GROWTH 5 DAYS Performed at [facility], [place] [address], [place], [place] [address]    Report Status 04/30/2018 FINAL  Final  Blood culture (routine x 2)     Status: None   Collection Time: 04/25/18  5:30 AM  Result Value Ref Range Status   Specimen Description   Final    BLOOD RIGHT ANTECUBITAL Performed at [facility] [address], [place], [place] [address]    Special Requests   Final      BOTTLES DRAWN AEROBIC AND ANAEROBIC Blood Culture adequate volume Performed at [facility] [address], [place], [place] [address]    Culture   Final    NO GROWTH 5 DAYS Performed at [facility], [place] [address] [place], [place] [address]    Report Status 04/30/2018 FINAL  Final  Respiratory Panel by PCR     Status: None   Collection Time: 04/25/18  6:23 PM  Result Value Ref Range Status   Adenovirus NOT DETECTED NOT DETECTED Final   Coronavirus 229E NOT DETECTED NOT DETECTED Final   Coronavirus HKU1 NOT DETECTED NOT DETECTED Final   Coronavirus NL63 NOT DETECTED NOT DETECTED Final   Coronavirus OC43 NOT DETECTED NOT DETECTED Final   Metapneumovirus NOT DETECTED NOT DETECTED Final   Rhinovirus / Enterovirus NOT DETECTED NOT DETECTED Final   Influenza A NOT DETECTED NOT DETECTED Final   Influenza B NOT DETECTED NOT DETECTED Final   Parainfluenza Virus 1 NOT DETECTED NOT DETECTED Final   Parainfluenza Virus 2 NOT DETECTED NOT DETECTED Final   Parainfluenza Virus 3 NOT DETECTED NOT DETECTED Final   Parainfluenza Virus 4 NOT DETECTED NOT DETECTED Final   Respiratory Syncytial Virus NOT DETECTED NOT DETECTED Final   Bordetella pertussis NOT DETECTED NOT DETECTED Final   Chlamydophila pneumoniae NOT DETECTED NOT DETECTED Final   Mycoplasma pneumoniae NOT DETECTED NOT DETECTED Final    Comment: Performed at [facility], [place] [address] [place], [place] [address]      Studies: Dg Chest 2 View  Result Date: 05/02/2018 CLINICAL DATA:  Initial evaluation for acute fever, productive cough. EXAM: CHEST - 2 VIEW COMPARISON:  Prior radiograph from 04/25/2018 FINDINGS: Cardiac and mediastinal silhouettes are stable in size and contour, and remain within normal limits. Lungs normally inflated. Patchy infiltrate overlies the mid right lung base, concerning for infiltrate given the provided history of cough and fever. Few scattered additional opacities at the  left lung base as well. No edema or effusion. No pneumothorax. Surgical clips overlie the left axilla.  No acute osseous finding. IMPRESSION: Scattered multifocal patchy bibasilar opacities, suspicious for possible infectious infiltrates given provided history of cough and fever. Electronically Signed   By: Jeannine Boga M.D.   On: 05/02/2018 23:25   Dg Abdomen 1 View  Result Date: 05/02/2018 CLINICAL DATA:  Initial evaluation for constipation. EXAM: ABDOMEN - 1 VIEW COMPARISON:  Prior CT from  04/02/2018 FINDINGS: Bowel gas pattern is nonobstructive. No abnormal bowel wall thickening. Large volume retained stool within the rectal vault/distal rectosigmoid colon. No abnormal calcification. IVC filter and cholecystectomy clips noted. 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 IMPRESSION: 1. Large volume retained stool within the distal rectosigmoid colon/rectal vault, compatible with constipation. 2. 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 Electronically Signed   By: Jeannine Boga M.D.   On: 05/02/2018 23:27    Scheduled Meds: . amLODipine  10 mg Oral Daily  . enoxaparin  100 mg Subcutaneous Q12H  . feeding supplement (ENSURE ENLIVE)  237 mL Oral BID BM  . fluticasone  1 spray Each Nare Daily  . folic acid  1 mg Oral Daily  . hydrALAZINE  10 mg Oral TID  . lactulose  10-20 g Oral TID  . loratadine  10 mg Oral Daily  . oseltamivir  75 mg Oral BID  . polyethylene glycol  17 g Oral Daily  . cyanocobalamin  1,000 mcg Oral Daily    Continuous Infusions: . sodium chloride 100 mL/hr at 05/03/18 0407  . ceFEPime (MAXIPIME) IV    . vancomycin       LOS: 0 days     Cristal Deer, MD [facility]  To reach me or the doctor on call, go to: [email or web address] Password [email or web address]  05/03/2018, 9:59 AM

## 2018-05-03 NOTE — Progress Notes (Signed)
Pt Enema return dark brown with small stool No solid, only enema return. Will cont to monitor. SRP, RN

## 2018-05-03 NOTE — Progress Notes (Signed)
Soap Suds Enema 500 ml given as ordered, pt tolerated well. Will con to monitor. SRP, RN

## 2018-05-03 NOTE — ED Notes (Signed)
Report given to Elizabeth, RN.

## 2018-05-03 NOTE — Progress Notes (Signed)
Pt MEWs score YELLOW due to Temp and HR, unchanged, MD made aware, Ibuprofen given for temp of 100.7, along with antibiotics as ordered will cont to monitor. SRP,RN

## 2018-05-03 NOTE — Progress Notes (Signed)
MEWS now GREEN, temp 99.4, pt talking and states feel some better. Pt continue to have urge to have BM, on liquid from enema and passing gas. Will cont to monitor, pt has external hemorrhoids noted. SRP, RN

## 2018-05-03 NOTE — Progress Notes (Signed)
Pharmacy Antibiotic Note  Michaela Little is a 58 y.o. female admitted on 05/02/2018 with sepsis.  Pharmacy has been consulted for cefepime/vancomycin dosing.  Plan: Cefepime 2 Gm x1 then 1 Gm IV q8h Vancomycin 2 Gm x1 then 1250 mg IV q12h for est AUC = 503 Goal AUC = 400-550 F/u scr/cultures/levels   Height: 5\' 7"  (170.2 cm) Weight: 234 lb 14.4 oz (106.5 kg) IBW/kg (Calculated) : 61.6  Temp (24hrs), Avg:103.2 F (39.6 C), Min:103.1 F (39.5 C), Max:103.3 F (39.6 C)  Recent Labs  Lab 04/26/18 0357 05/02/18 2221  WBC 3.4* 3.6*  CREATININE 0.75 0.78  LATICACIDVEN  --  1.6    Estimated Creatinine Clearance: 94 mL/min (by C-G formula based on SCr of 0.78 mg/dL).    Allergies  Allergen Reactions  . Lisinopril-Hydrochlorothiazide Itching  . Adhesive [Tape] Itching and Other (See Comments)    Redness  . Fentanyl Other (See Comments)    GI upset and drowsiness. Only to [facility]  . Gabapentin Other (See Comments)    Auditory hallucinations  . Hctz [Hydrochlorothiazide] Itching  . Latex Itching  . Lisinopril Itching  . Losartan Potassium Other (See Comments)    Makes her feel "bad"   . Other Other (See Comments)    Patient refuses blood for religious reasons (per her notes)  . Oxycodone     GI upset, drowsy  . Tums [Calcium Carbonate Antacid] Hives    FRUIT-FLAVORED ONES    Antimicrobials this admission: 2/7 cefepime >>  2/7 vancomycin >>   Dose adjustments this admission:   Microbiology results:  BCx:   UCx:    Sputum:    MRSA PCR:   Thank you for allowing pharmacy to be a part of this patient's care.  Dorrene German 05/03/2018 2:56 AM

## 2018-05-03 NOTE — H&P (Signed)
History and Physical    Michaela Little GBT:[id number] DOB: 19-Nov-1960 DOA: 05/02/2018  PCP: Ladell Pier, MD  Patient coming from: Home.  Chief Complaint: Fever chills and productive cough.  HPI: Michaela Little is a 57 y.o. female with history of metastatic breast cancer, pulmonary embolism on Lovenox, pancytopenia, Jehovah's Witness, hypertension who was recently admitted for fever presumed to be secondary to UTI presents to the ER because of worsening fever chills and productive cough for the last 3 days.  Denies any chest pain.  Has benign exam lower quadrant abdominal pain with no nausea or vomiting or diarrhea.  ED Course: In the ER patient was found to have a fever 103 F tachycardic with chest x-ray showing infiltrates concerning for pneumonia.  Patient's flu turn positive.  Have blood cultures drawn was initially started on empiric antibiotics for pneumonia.  Tamiflu started after flu panel returned positive.  X-ray of the abdomen and chest was done.  X-ray abdomen shows large volume stool.  Abdomen appears benign on exam.  Has pancytopenia and is Jehovah's Witness with hemoglobin at baseline.  Review of Systems: As per HPI, rest all negative.   Past Medical History:  Diagnosis Date  . Anxiety   . Arthritis    hands, knees, hips  . Asthma   . Bronchitis   . Cancer ([place])   . Cervical stenosis (uterine cervix)   . Disorder of appendix    Enlarged  . Dyspnea on exertion   . GERD (gastroesophageal reflux disease)   . Headache(784.0)   . History of breast cancer    2010--  LEFT  s/p  mastectomy (in [place]) AND CHEMORADIATION--  NO RECURRENCE  . History of cervical dysplasia   . Hyperlipidemia   . Hypertension   . Malignant neoplasm of overlapping sites of left breast in female, estrogen receptor positive ([place]) 03/05/2013  . OSA (obstructive sleep apnea) moderate osa per study  09/2010   CPAP  , NOT USING ON REGULAR BASIS  . Pelvic pain in female   . Positive H. pylori  test    08-05-2013  . Positive TB test    AS TEEN--  [place] W/ MEDS  . Refusal of blood transfusions as patient is Jehovah's Witness   . Seasonal allergies   . Uterine fibroid   . Wears glasses     Past Surgical History:  Procedure Laterality Date  . APPENDECTOMY  y  . CARDIOVASCULAR STRESS TEST  10-29-2012   low risk perfusion study/  no significant reversibity/ ef 66%/  normal wall motion  . CERVICAL CONIZATION W/BX  2012   in  [place] N/A 05/14/2012   Procedure: LAPAROSCOPIC CHOLECYSTECTOMY;  Surgeon: Ralene Ok, MD;  Location: [facility];  Service: General;  Laterality: N/A;  . COLONOSCOPY  2011   normal per patient - [place]  . DILATION AND CURETTAGE OF UTERUS  10/15/2011   Procedure: DILATATION AND CURETTAGE;  Surgeon: Melina Schools, MD;  Location: [facility];  Service: Gynecology;  Laterality: N/A;  Conization &  endocervical curettings  . EXAMINATION UNDER ANESTHESIA N/A 08/20/2013   Procedure: EXAM UNDER ANESTHESIA;  Surgeon: Margarette Asal, MD;  [facility];  Service: Gynecology;  Laterality: N/A;  . [place]   right  . IR IVC FILTER PLMT / S&I /IMG GUID/MOD SED  03/01/2018  . IR RADIOLOGIST EVAL & MGMT  04/08/2018  . LAPAROSCOPIC APPENDECTOMY N/A 08/22/2017   Procedure: APPENDECTOMY LAPAROSCOPIC  ERAS PATHWAY;  Surgeon: Coralie Keens, MD;  Location: [facility];  Service: General;  Laterality: N/A;  . LAPAROSCOPIC ASSISTED VAGINAL HYSTERECTOMY N/A 10/26/2014   Procedure: HYSTERECTOMY ABDOMINAL ;  Surgeon: Molli Posey, MD;  Location: [facility];  Service: Gynecology;  Laterality: N/A;  . MASTECTOMY Left 11/2008  in [place]   . SALPINGOOPHORECTOMY Bilateral 10/26/2014   Procedure: BILATERAL SALPINGO OOPHORECTOMY;  Surgeon: Molli Posey, MD;  Location: [facility];  Service: Gynecology;  Laterality: Bilateral;  . TISSUE EXPANDER PLACEMENT Left 08/22/2017   Procedure: REMOVAL OF LEFT TISSUE EXPANDER;  Surgeon: Crissie Reese, MD;   Location: [facility];  Service: Plastics;  Laterality: Left;  . TISSUE EXPANDER REMOVAL Left 08/22/2017  . TRANSTHORACIC ECHOCARDIOGRAM  10-29-2012   mild lvh/  ef 60-65%/  grade II diastolic dysfunction/  trivial mr  &  tr     reports that she quit smoking about 30 years ago. Her smoking use included cigarettes. She has a 3.00 pack-year smoking history. She has never used smokeless tobacco. She reports that she does not drink alcohol or use drugs.  Allergies  Allergen Reactions  . Lisinopril-Hydrochlorothiazide Itching  . Adhesive [Tape] Itching and Other (See Comments)    Redness  . Fentanyl Other (See Comments)    GI upset and drowsiness. Only to [facility]  . Gabapentin Other (See Comments)    Auditory hallucinations  . Hctz [Hydrochlorothiazide] Itching  . Latex Itching  . Lisinopril Itching  . Losartan Potassium Other (See Comments)    Makes her feel "bad"   . Other Other (See Comments)    Patient refuses blood for religious reasons (per her notes)  . Oxycodone     GI upset, drowsy  . Tums [Calcium Carbonate Antacid] Hives    FRUIT-FLAVORED ONES    Family History  Problem Relation Age of Onset  . Breast cancer Mother   . Colon cancer Mother   . Hypotension Mother   . Asthma Mother   . Diabetes type II Mother   . Arthritis Mother   . Clotting disorder Mother   . Cancer Mother        breast/colon  . Mental illness Brother   . Heart disease Brother   . Cerebral palsy Daughter   . Emphysema Brother        never smoker  . Colon cancer Maternal Aunt   . Cancer Maternal Aunt        colon  . Colon cancer Maternal Uncle   . Cancer Maternal Uncle        colon  . Esophageal cancer Neg Hx   . Rectal cancer Neg Hx   . Stomach cancer Neg Hx   . Thyroid disease Neg Hx     Prior to Admission medications   Medication Sig Start Date End Date Taking? Authorizing Provider  acetaminophen (TYLENOL) 500 MG tablet Take 500 mg by mouth every 6 (six) hours as needed for mild  pain.   Yes [provider]  amLODipine (NORVASC) 10 MG tablet Take 1 tablet (10 mg total) by mouth daily. 04/22/18  Yes Kayleen Memos, DO  cetirizine (ZYRTEC) 10 MG tablet Take 10 mg by mouth daily. 12/19/17  Yes [provider]  docusate sodium (COLACE) 100 MG capsule Take 1 capsule (100 mg total) by mouth daily as needed for mild constipation. 04/28/18  Yes Hosie Poisson, MD  enoxaparin (LOVENOX) 100 MG/ML injection Inject 1 mL (100 mg total) into the skin every  12 (twelve) hours. 05/01/18  Yes [name], MD  feeding supplement, ENSURE ENLIVE, (ENSURE ENLIVE) LIQD Take 237 mLs by mouth 2 (two) times daily between meals. Patient taking differently: Take 237 mLs by mouth 2 (two) times daily between meals. Non-dairy Ensure 04/28/18  Yes Hosie Poisson, MD  fluticasone (FLONASE) 50 MCG/ACT nasal spray Place 1 spray into both nostrils daily. 04/29/18  Yes Hosie Poisson, MD  fluticasone furoate-vilanterol (BREO ELLIPTA) 100-25 MCG/INH AEPB Inhale 1 puff into the lungs daily. Patient taking differently: Inhale 1 puff into the lungs daily as needed (sob and wheezing).  10/18/17  Yes Martyn Ehrich, NP  folic acid (FOLVITE) 1 MG tablet Take 1 tablet (1 mg total) by mouth daily. 01/05/18  Yes Vann, Jessica U, DO  furosemide (LASIX) 20 MG tablet Take 20 mg by mouth daily as needed for fluid. 04/29/18  Yes [provider]  hydrALAZINE (APRESOLINE) 10 MG tablet Take 1 tablet (10 mg total) by mouth 3 (three) times daily. 04/22/18  Yes Hall, Carole N, DO  ipratropium-albuterol (DUONEB) 0.5-2.5 (3) MG/3ML SOLN INHALE THREE MLS VIA NEBULIZER EVERY 6 HOURS AS NEEDED Patient taking differently: Take 3 mLs by nebulization every 6 (six) hours as needed (for wheezing or shortness of breath).  02/13/18  Yes Clent Demark, PA-C  Multiple Vitamins-Minerals (MULTIVITAMIN ADULT PO) Take 1 tablet by mouth daily.   Yes [provider]  polyethylene glycol (MIRALAX / GLYCOLAX) packet Take 17 g by  mouth daily. 04/22/18  Yes Kayleen Memos, DO  traMADol (ULTRAM) 50 MG tablet Take 2 tablets (100 mg total) by mouth every 6 (six) hours. Patient taking differently: Take 100 mg by mouth every 6 (six) hours as needed for moderate pain or severe pain.  03/08/18  Yes Mikhail, [place], DO  vitamin B-12 1000 MCG tablet Take 1 tablet (1,000 mcg total) by mouth daily. 01/05/18  Yes Vann, Jessica U, DO  lactulose (CHRONULAC) 10 GM/15ML solution Take 15-30 mLs (10-20 g total) by mouth 3 (three) times daily. 05/01/18   [name], MD  levofloxacin (LEVAQUIN) 500 MG tablet Take 1 tablet (500 mg total) by mouth daily. 05/02/18   [name], MD    Physical Exam: Vitals:   05/03/18 0042 05/03/18 0100 05/03/18 0130 05/03/18 0230  BP: (!) 190/120 (!) 143/74 (!) 154/79 122/70  Pulse: 81 (!) 120 (!) 108 (!) 118  Resp: 18 (!) 26 (!) 26 16  Temp:      TempSrc:      SpO2: 95% 100% 100% 100%  Weight:      Height:          Constitutional: Moderately built and nourished. Vitals:   05/03/18 0042 05/03/18 0100 05/03/18 0130 05/03/18 0230  BP: (!) 190/120 (!) 143/74 (!) 154/79 122/70  Pulse: 81 (!) 120 (!) 108 (!) 118  Resp: 18 (!) 26 (!) 26 16  Temp:      TempSrc:      SpO2: 95% 100% 100% 100%  Weight:      Height:       Eyes: Anicteric no pallor. ENMT: No discharge from the ears eyes nose and mouth. Neck: No mass felt.  No neck rigidity. Respiratory: No rhonchi or crepitations. Cardiovascular: S1-S2 heard. Abdomen: Soft nontender bowel sounds present.  Mildly distended. Musculoskeletal: No edema. Skin: No rash. Neurologic: Alert awake oriented to time place and person.  Moves all extremities. Psychiatric: Appears normal.  Normal affect.   Labs on Admission: I have personally reviewed following labs  and imaging studies  CBC: Recent Labs  Lab 04/26/18 0357 05/02/18 2221  WBC 3.4* 3.6*  NEUTROABS  --  2.4  HGB 3.9* 4.5*  HCT 13.5* 15.9*  MCV 95.1 97.0  PLT 122* 144*   Basic Metabolic  Panel: Recent Labs  Lab 04/26/18 0357 05/02/18 2221  NA 133* 132*  K 3.8 4.2  CL 105 101  CO2 19* 22  GLUCOSE 104* 125*  BUN 11 12  CREATININE 0.75 0.78  CALCIUM 8.1* 9.4   GFR: Estimated Creatinine Clearance: 94 mL/min (by C-G formula based on SCr of 0.78 mg/dL). Liver Function Tests: Recent Labs  Lab 04/28/18 0447 05/02/18 2221  AST 23 43*  ALT 16 22  ALKPHOS 93 111  BILITOT 0.2* 1.6*  PROT 6.3* 7.9  ALBUMIN 2.6* 3.5   Recent Labs  Lab 05/02/18 2221  LIPASE 31   No results for input(s): AMMONIA in the last 168 hours. Coagulation Profile: Recent Labs  Lab 05/02/18 2221  INR 1.26   Cardiac Enzymes: Recent Labs  Lab 05/02/18 2221  TROPONINI <0.03   BNP (last 3 results) No results for input(s): PROBNP in the last 8760 hours. HbA1C: No results for input(s): HGBA1C in the last 72 hours. CBG: No results for input(s): GLUCAP in the last 168 hours. Lipid Profile: No results for input(s): CHOL, HDL, LDLCALC, TRIG, CHOLHDL, LDLDIRECT in the last 72 hours. Thyroid Function Tests: No results for input(s): TSH, T4TOTAL, FREET4, T3FREE, THYROIDAB in the last 72 hours. Anemia Panel: No results for input(s): VITAMINB12, FOLATE, FERRITIN, TIBC, IRON, RETICCTPCT in the last 72 hours. Urine analysis:    Component Value Date/Time   COLORURINE YELLOW 05/03/2018 0044   APPEARANCEUR CLEAR 05/03/2018 0044   LABSPEC 1.014 05/03/2018 0044   LABSPEC 1.025 01/11/2016 1233   PHURINE 6.0 05/03/2018 0044   GLUCOSEU NEGATIVE 05/03/2018 0044   GLUCOSEU Negative 01/11/2016 1233   HGBUR NEGATIVE 05/03/2018 0044   BILIRUBINUR NEGATIVE 05/03/2018 0044   BILIRUBINUR small 12/03/2016 1117   BILIRUBINUR Negative 01/11/2016 1233   KETONESUR NEGATIVE 05/03/2018 0044   PROTEINUR NEGATIVE 05/03/2018 0044   UROBILINOGEN 1.0 12/03/2016 1117   UROBILINOGEN 1.0 09/13/2016 1306   UROBILINOGEN 0.2 01/11/2016 1233   NITRITE NEGATIVE 05/03/2018 0044   LEUKOCYTESUR NEGATIVE 05/03/2018 0044     LEUKOCYTESUR Small 01/11/2016 1233   Sepsis Labs: @LABRCNTIP (procalcitonin:4,lacticidven:4) ) Recent Results (from the past 240 hour(s))  Urine culture     Status: Abnormal   Collection Time: 04/25/18  4:12 AM  Result Value Ref Range Status   Specimen Description   Final    URINE, CLEAN CATCH Performed at [facility], [place] [address], [place], [place] [address]    Special Requests   Final    NONE Performed at [facility], [place] [address] [place], [place] [address]    Culture (A)  Final    <10,000 COLONIES/mL INSIGNIFICANT GROWTH Performed at [facility] [address], [place], [place] [address]    Report Status 04/26/2018 FINAL  Final  Blood culture (routine x 2)     Status: None   Collection Time: 04/25/18  5:30 AM  Result Value Ref Range Status   Specimen Description   Final    BLOOD RIGHT HAND Performed at [facility] [address] [place], [place] [address]    Special Requests   Final    BOTTLES DRAWN AEROBIC AND ANAEROBIC Blood Culture results may not be optimal due to an excessive volume  of blood received in culture bottles Performed at [facility], [place] [address] [place], [place] [address]    Culture   Final    NO GROWTH 5 DAYS Performed at [facility], [place] [address] [place], [place] [address]    Report Status 04/30/2018 FINAL  Final  Blood culture (routine x 2)     Status: None   Collection Time: 04/25/18  5:30 AM  Result Value Ref Range Status   Specimen Description   Final    BLOOD RIGHT ANTECUBITAL Performed at [facility] [address] [place], [place] [address]    Special Requests   Final    BOTTLES DRAWN AEROBIC AND ANAEROBIC Blood Culture adequate volume Performed at [facility] [address] [place], [place] [address]    Culture   Final    NO GROWTH 5 DAYS Performed at [facility], [place]  [address] [place], [place] [address]    Report Status 04/30/2018 FINAL  Final  Respiratory Panel by PCR     Status: None   Collection Time: 04/25/18  6:23 PM  Result Value Ref Range Status   Adenovirus NOT DETECTED NOT DETECTED Final   Coronavirus 229E NOT DETECTED NOT DETECTED Final   Coronavirus HKU1 NOT DETECTED NOT DETECTED Final   Coronavirus NL63 NOT DETECTED NOT DETECTED Final   Coronavirus OC43 NOT DETECTED NOT DETECTED Final   Metapneumovirus NOT DETECTED NOT DETECTED Final   Rhinovirus / Enterovirus NOT DETECTED NOT DETECTED Final   Influenza A NOT DETECTED NOT DETECTED Final   Influenza B NOT DETECTED NOT DETECTED Final   Parainfluenza Virus 1 NOT DETECTED NOT DETECTED Final   Parainfluenza Virus 2 NOT DETECTED NOT DETECTED Final   Parainfluenza Virus 3 NOT DETECTED NOT DETECTED Final   Parainfluenza Virus 4 NOT DETECTED NOT DETECTED Final   Respiratory Syncytial Virus NOT DETECTED NOT DETECTED Final   Bordetella pertussis NOT DETECTED NOT DETECTED Final   Chlamydophila pneumoniae NOT DETECTED NOT DETECTED Final   Mycoplasma pneumoniae NOT DETECTED NOT DETECTED Final    Comment: Performed at [facility], [place] [address], [place], [place] [address]     Radiological Exams on Admission: Dg Chest 2 View  Result Date: 05/02/2018 CLINICAL DATA:  Initial evaluation for acute fever, productive cough. EXAM: CHEST - 2 VIEW COMPARISON:  Prior radiograph from 04/25/2018 FINDINGS: Cardiac and mediastinal silhouettes are stable in size and contour, and remain within normal limits. Lungs normally inflated. Patchy infiltrate overlies the mid right lung base, concerning for infiltrate given the provided history of cough and fever. Few scattered additional opacities at the left lung base as well. No edema or effusion. No pneumothorax. Surgical clips overlie the left axilla.  No acute osseous finding. IMPRESSION: Scattered multifocal patchy bibasilar opacities, suspicious for possible infectious  infiltrates given provided history of cough and fever. Electronically Signed   By: Jeannine Boga M.D.   On: 05/02/2018 23:25   Dg Abdomen 1 View  Result Date: 05/02/2018 CLINICAL DATA:  Initial evaluation for constipation. EXAM: ABDOMEN - 1 VIEW COMPARISON:  Prior CT from 04/02/2018 FINDINGS: Bowel gas pattern is nonobstructive. No abnormal bowel wall thickening. Large volume retained stool within the rectal vault/distal rectosigmoid colon. No abnormal calcification. IVC filter and cholecystectomy clips noted. 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 IMPRESSION: 1. Large volume retained stool within the distal rectosigmoid colon/rectal vault, compatible  with constipation. 2. 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 Electronically Signed   By: Jeannine Boga M.D.   On: 05/02/2018 23:27    EKG: Independently reviewed.  Sinus tachycardia with PVCs.  Assessment/Plan Principal Problem:   Sepsis ([place]) Active Problems:   OSA (obstructive sleep apnea)   Influenza   Pancytopenia ([place])   Patient is Jehovah's Witness   Pulmonary embolism ([place])   Metastatic breast cancer ([place])    1. Sepsis secondary to pneumonia -influenza is positive.  Patient is on Tamiflu.  Chest x-ray does show infiltrates.  For now patient on empiric antibiotics also.  Follow cultures.  Gentle hydration. 2. Abdominal discomfort likely from constipation.  CT scan is pending.  Soapsuds enema ordered. 3. Pancytopenia -appears to be at baseline.  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  Closely follow CBC. 4. Pulmonary embolism on Lovenox. 5. Metastatic breast cancer being followed by oncologist. 6. Hypertension on amlodipine and hydralazine.  At presentation was elevated improved after admission.  Closely monitor  trends. 7. Sleep apnea will keep patient on CPAP. 8. Diastolic CHF per the chart.  Receiving fluids for possible sepsis.  Since patient is Jehovah's Witness blood draws are minimalized and will need pediatric tubes.   DVT prophylaxis: Lovenox. Code Status: Full code. Family Communication: Family at the bedside. Disposition Plan: Home. Consults called: None. Admission status: Inpatient.   Rise Patience MD [facility] Pager [phone].  If 7PM-7AM, please contact night-coverage [email or web address] Password [facility]  05/03/2018, 2:43 AM

## 2018-05-03 NOTE — ED Notes (Signed)
ED TO INPATIENT HANDOFF REPORT  Name/Age/Gender Michaela Little 58 y.o. female  Code Status    Code Status Orders  (From admission, onward)         Start     Ordered   05/03/18 0241  Full code  Continuous     05/03/18 0242        Code Status History    Date Active Date Inactive Code Status Order ID Comments User Context   04/25/2018 0541 04/28/2018 2118 Full Code [id number]  Rise Patience, MD ED   04/18/2018 1753 04/22/2018 2015 Full Code [id number]  Kayleen Memos, DO Inpatient   02/27/2018 2216 03/08/2018 1806 Full Code [id number]  Merton Border, MD Inpatient   01/02/2018 0127 01/04/2018 1637 Full Code [id number]  Shela Leff, MD ED   08/22/2017 1714 08/23/2017 1502 Full Code [id number]  Coralie Keens, MD Inpatient   10/26/2014 1215 10/28/2014 1749 Full Code [id number]  Molli Posey, MD Inpatient   10/01/2012 2334 10/02/2012 2028 Full Code [id number]  Delfina Redwood, MD Inpatient   03/05/2011 0323 03/06/2011 1804 Full Code [id number]  Leanora Ivanoff, RN Inpatient      Home/SNF/Other Home  Chief Complaint Fever/SOB/Cough/Constipation  Level of Care/Admitting Diagnosis ED Disposition    ED Disposition Condition [place] [facility] Area: [facility] [[id number]]  Level of Care: Telemetry [5]  Admit to tele based on following criteria: Monitor for Ischemic changes  Diagnosis: Sepsis [facility]) [[id number]]  Admitting Physician: Rise Patience [phone]  Attending Physician: Rise Patience [phone]  Estimated length of stay: past midnight tomorrow  Certification:: I certify this patient will need inpatient services for at least 2 midnights  PT Class (Do Not Modify): Inpatient [101]  PT Acc Code (Do Not Modify): Private [1]       Medical History Past Medical History:  Diagnosis Date  . Anxiety   . Arthritis    hands, knees, hips  . Asthma   . Bronchitis   . Cancer ([place])   . Cervical stenosis (uterine cervix)   . Disorder of  appendix    Enlarged  . Dyspnea on exertion   . GERD (gastroesophageal reflux disease)   . Headache(784.0)   . History of breast cancer    2010--  LEFT  s/p  mastectomy (in [place]) AND CHEMORADIATION--  NO RECURRENCE  . History of cervical dysplasia   . Hyperlipidemia   . Hypertension   . Malignant neoplasm of overlapping sites of left breast in female, estrogen receptor positive ([place]) 03/05/2013  . OSA (obstructive sleep apnea) moderate osa per study  09/2010   CPAP  , NOT USING ON REGULAR BASIS  . Pelvic pain in female   . Positive H. pylori test    08-05-2013  . Positive TB test    AS TEEN--  [place] W/ MEDS  . Refusal of blood transfusions as patient is Jehovah's Witness   . Seasonal allergies   . Uterine fibroid   . Wears glasses     Allergies Allergies  Allergen Reactions  . Lisinopril-Hydrochlorothiazide Itching  . Adhesive [Tape] Itching and Other (See Comments)    Redness  . Fentanyl Other (See Comments)    GI upset and drowsiness. Only to [facility]  . Gabapentin Other (See Comments)    Auditory hallucinations  . Hctz [Hydrochlorothiazide] Itching  . Latex Itching  . Lisinopril Itching  . Losartan Potassium Other (See Comments)    Makes her feel "bad"   .  Other Other (See Comments)    Patient refuses blood for religious reasons (per her notes)  . Oxycodone     GI upset, drowsy  . Tums [Calcium Carbonate Antacid] Hives    FRUIT-FLAVORED ONES    IV Location/Drains/Wounds Patient Lines/Drains/Airways Status   Active Line/Drains/Airways    Name:   Placement date:   Placement time:   Site:   Days:   Peripheral IV 05/02/18 Right Antecubital   05/02/18    2216    Antecubital   1   Incision (Closed) Chest Right   -    -               Labs/Imaging Results for orders placed or performed during the hospital encounter of 05/02/18 (from the past 48 hour(s))  Lactic acid, plasma     Status: None   Collection Time: 05/02/18 10:21 PM  Result Value Ref Range    Lactic Acid, Venous 1.6 0.5 - 1.9 mmol/L    Comment: Performed at [facility], [place] [address], [place], [place] [address]  Comprehensive metabolic panel     Status: Abnormal   Collection Time: 05/02/18 10:21 PM  Result Value Ref Range   Sodium 132 (L) 135 - 145 mmol/L   Potassium 4.2 3.5 - 5.1 mmol/L   Chloride 101 98 - 111 mmol/L   CO2 22 22 - 32 mmol/L   Glucose, Bld 125 (H) 70 - 99 mg/dL   BUN 12 6 - 20 mg/dL   Creatinine, Ser 0.78 0.44 - 1.00 mg/dL   Calcium 9.4 8.9 - 10.3 mg/dL   Total Protein 7.9 6.5 - 8.1 g/dL   Albumin 3.5 3.5 - 5.0 g/dL   AST 43 (H) 15 - 41 U/L   ALT 22 0 - 44 U/L   Alkaline Phosphatase 111 38 - 126 U/L   Total Bilirubin 1.6 (H) 0.3 - 1.2 mg/dL   GFR calc non Af Amer >60 >60 mL/min   GFR calc Af Amer >60 >60 mL/min   Anion gap 9 5 - 15    Comment: Performed at [facility], [place] [address] [place], [place] [address]  CBC WITH DIFFERENTIAL     Status: Abnormal   Collection Time: 05/02/18 10:21 PM  Result Value Ref Range   WBC 3.6 (L) 4.0 - 10.5 K/uL   RBC 1.64 (L) 3.87 - 5.11 MIL/uL   Hemoglobin 4.5 (LL) 12.0 - 15.0 g/dL    Comment: This critical result has verified and been called to DOSTER T RN by Colman Cater on 02 07 2020 at 2337, and has been read back. Critical result verified   HCT 15.9 (L) 36.0 - 46.0 %   MCV 97.0 80.0 - 100.0 fL   MCH 27.4 26.0 - 34.0 pg   MCHC 28.3 (L) 30.0 - 36.0 g/dL   RDW 19.7 (H) 11.5 - 15.5 %   Platelets 142 (L) 150 - 400 K/uL   nRBC 6.7 (H) 0.0 - 0.2 %   Neutrophils Relative % 68 %   Neutro Abs 2.4 1.7 - 7.7 K/uL   Lymphocytes Relative 14 %   Lymphs Abs 0.5 (L) 0.7 - 4.0 K/uL   Monocytes Relative 11 %   Monocytes Absolute 0.4 0.1 - 1.0 K/uL   Eosinophils Relative 1 %   Eosinophils Absolute 0.0 0.0 - 0.5 K/uL   Basophils Relative 1 %   Basophils Absolute 0.0 0.0 - 0.1 K/uL   Immature Granulocytes 5 %   Abs  Immature Granulocytes 0.19 (H) 0.00 - 0.07 K/uL   Polychromasia  PRESENT    Stomatocytes PRESENT     Comment: Performed at [facility], [place] [address], [place], [place] [address]  Lipase, blood     Status: None   Collection Time: 05/02/18 10:21 PM  Result Value Ref Range   Lipase 31 11 - 51 U/L    Comment: Performed at [facility], [place] [address] [place], [place] [address]  Brain natriuretic peptide     Status: None   Collection Time: 05/02/18 10:21 PM  Result Value Ref Range   B Natriuretic Peptide 39.3 0.0 - 100.0 pg/mL    Comment: Performed at [facility], [place] [address], [place], [place] [address]  Troponin I - ONCE - STAT     Status: None   Collection Time: 05/02/18 10:21 PM  Result Value Ref Range   Troponin I <0.03 <0.03 ng/mL    Comment: Performed at [facility], [place] [address], [place], [place] [address]  Protime-INR     Status: Abnormal   Collection Time: 05/02/18 10:21 PM  Result Value Ref Range   Prothrombin Time 15.7 (H) 11.4 - 15.2 seconds   INR 1.26     Comment: Performed at [facility], [place] [address] [place], [place] [address]  I-Stat Troponin, ED (not at [facility])     Status: None   Collection Time: 05/02/18 10:24 PM  Result Value Ref Range   Troponin i, poc 0.02 0.00 - 0.08 ng/mL   Comment 3            Comment: Due to the release kinetics of cTnI, a negative result within the first hours of the onset of symptoms does not rule out myocardial infarction with certainty. If myocardial infarction is still suspected, repeat the test at appropriate intervals.   Influenza panel by PCR (type A & B)     Status: Abnormal   Collection Time: 05/03/18 12:37 AM  Result Value Ref Range   Influenza A By PCR POSITIVE (A) NEGATIVE   Influenza B By PCR NEGATIVE NEGATIVE    Comment: (NOTE) The Xpert Xpress Flu assay is intended as an aid in the diagnosis of  influenza and should not be used as a sole basis for treatment.  This  assay is FDA  approved for nasopharyngeal swab specimens only. Nasal  washings and aspirates are unacceptable for Xpert Xpress Flu testing. Performed at [facility], [place] [address], [place],  [address]   Urinalysis, Routine w reflex microscopic     Status: None   Collection Time: 05/03/18 12:44 AM  Result Value Ref Range   Color, Urine YELLOW YELLOW   APPearance CLEAR CLEAR   Specific Gravity, Urine 1.014 1.005 - 1.030   pH 6.0 5.0 - 8.0   Glucose, UA NEGATIVE NEGATIVE mg/dL   Hgb urine dipstick NEGATIVE NEGATIVE   Bilirubin Urine NEGATIVE NEGATIVE   Ketones, ur NEGATIVE NEGATIVE mg/dL   Protein, ur NEGATIVE NEGATIVE mg/dL   Nitrite NEGATIVE NEGATIVE   Leukocytes, UA NEGATIVE NEGATIVE    Comment: Performed at [place] [address], [place],  [address]   Dg Chest 2 View  Result Date: 05/02/2018 CLINICAL DATA:  Initial evaluation for acute fever, productive cough. EXAM: CHEST - 2 VIEW COMPARISON:  Prior radiograph from 04/25/2018 FINDINGS: Cardiac and mediastinal silhouettes are stable in size and contour, and remain within normal limits. Lungs normally  inflated. Patchy infiltrate overlies the mid right lung base, concerning for infiltrate given the provided history of cough and fever. Few scattered additional opacities at the left lung base as well. No edema or effusion. No pneumothorax. Surgical clips overlie the left axilla.  No acute osseous finding. IMPRESSION: Scattered multifocal patchy bibasilar opacities, suspicious for possible infectious infiltrates given provided history of cough and fever. Electronically Signed   By: Jeannine Boga M.D.   On: 05/02/2018 23:25   Dg Abdomen 1 View  Result Date: 05/02/2018 CLINICAL DATA:  Initial evaluation for constipation. EXAM: ABDOMEN - 1 VIEW COMPARISON:  Prior CT from 04/02/2018 FINDINGS: Bowel gas pattern is nonobstructive. No abnormal bowel wall thickening. Large volume retained stool within  the rectal vault/distal rectosigmoid colon. No abnormal calcification. IVC filter and cholecystectomy clips noted. 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 IMPRESSION: 1. Large volume retained stool within the distal rectosigmoid colon/rectal vault, compatible with constipation. 2. 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 Electronically Signed   By: Jeannine Boga M.D.   On: 05/02/2018 23:27    Pending Labs Unresulted Labs (From admission, onward)    Start     Ordered   05/02/18 2202  Urine culture  ONCE - STAT,   STAT    Question:  Patient immune status  Answer:  Immunocompromised   05/02/18 2201   05/02/18 2201  Blood Culture (routine x 2)  BLOOD CULTURE X 2,   STAT    Question:  Patient immune status  Answer:  Immunocompromised   05/02/18 2201          Vitals/Pain Today's Vitals   05/03/18 0042 05/03/18 0100 05/03/18 0130 05/03/18 0230  BP: (!) 190/120 (!) 143/74 (!) 154/79 122/70  Pulse: 81 (!) 120 (!) 108 (!) 118  Resp: 18 (!) 26 (!) 26 16  Temp:      TempSrc:      SpO2: 95% 100% 100% 100%  Weight:      Height:      PainSc:        Isolation Precautions Droplet precaution  Medications Medications  oseltamivir (TAMIFLU) capsule 75 mg (75 mg Oral Given 05/03/18 0245)  traMADol (ULTRAM) tablet 100 mg (has no administration in time range)  amLODipine (NORVASC) tablet 10 mg (has no administration in time range)  hydrALAZINE (APRESOLINE) tablet 10 mg (has no administration in time range)  docusate sodium (COLACE) capsule 100 mg (has no administration in time range)  lactulose (CHRONULAC) 10 GM/15ML solution 10-20 g (has no administration in time range)  polyethylene glycol (MIRALAX / GLYCOLAX) packet 17 g (has no administration in time range)  enoxaparin (LOVENOX) injection 100 mg (has no  administration in time range)  folic acid (FOLVITE) tablet 1 mg (has no administration in time range)  vitamin B-12 (CYANOCOBALAMIN) tablet 1,000 mcg (has no administration in time range)  feeding supplement (ENSURE ENLIVE) (ENSURE ENLIVE) liquid 237 mL (has no administration in time range)  loratadine (CLARITIN) tablet 10 mg (has no administration in time range)  fluticasone (FLONASE) 50 MCG/ACT nasal spray 1 spray (has no administration in time range)  ipratropium-albuterol (DUONEB) 0.5-2.5 (3) MG/3ML nebulizer solution 3 mL (has no administration in time range)  acetaminophen (TYLENOL) tablet 650 mg (has no administration in time range)    Or  acetaminophen (TYLENOL) suppository 650 mg (has no administration in time range)  ondansetron (ZOFRAN) tablet 4 mg (  has no administration in time range)    Or  ondansetron (ZOFRAN) injection 4 mg (has no administration in time range)  0.9 %  sodium chloride infusion (has no administration in time range)  acetaminophen (TYLENOL) tablet 325 mg (325 mg Oral Given 05/02/18 2224)  ceFEPIme (MAXIPIME) 2 g in sodium chloride 0.9 % 100 mL IVPB (0 g Intravenous Stopped 05/02/18 2350)  vancomycin (VANCOCIN) 2,000 mg in sodium chloride 0.9 % 500 mL IVPB (0 mg Intravenous Stopped 05/03/18 0225)  sodium chloride 0.9 % bolus 500 mL (0 mLs Intravenous Stopped 05/03/18 0057)  acetaminophen (TYLENOL) tablet 325 mg (325 mg Oral Given 05/03/18 0245)    Mobility walks with person assist

## 2018-05-04 ENCOUNTER — Inpatient Hospital Stay (HOSPITAL_COMMUNITY): Payer: Medicare Other

## 2018-05-04 DIAGNOSIS — E440 Moderate protein-calorie malnutrition: Secondary | ICD-10-CM

## 2018-05-04 DIAGNOSIS — G4733 Obstructive sleep apnea (adult) (pediatric): Secondary | ICD-10-CM

## 2018-05-04 LAB — COMPREHENSIVE METABOLIC PANEL
ALT: 18 U/L (ref 0–44)
AST: 37 U/L (ref 15–41)
Albumin: 2.6 g/dL — ABNORMAL LOW (ref 3.5–5.0)
Alkaline Phosphatase: 79 U/L (ref 38–126)
Anion gap: 8 (ref 5–15)
BUN: 10 mg/dL (ref 6–20)
CO2: 18 mmol/L — ABNORMAL LOW (ref 22–32)
Calcium: 8.2 mg/dL — ABNORMAL LOW (ref 8.9–10.3)
Chloride: 109 mmol/L (ref 98–111)
Creatinine, Ser: 0.62 mg/dL (ref 0.44–1.00)
GFR calc Af Amer: >60 mL/min (ref >60)
GFR calc non Af Amer: >60 mL/min (ref >60)
Glucose, Bld: 94 mg/dL (ref 70–99)
POTASSIUM: 4.4 mmol/L (ref 3.5–5.1)
SODIUM: 135 mmol/L (ref 135–145)
Total Bilirubin: 0.5 mg/dL (ref 0.3–1.2)
Total Protein: 6.2 g/dL — ABNORMAL LOW (ref 6.5–8.1)

## 2018-05-04 LAB — URINE CULTURE: Culture: NO GROWTH

## 2018-05-04 MED ORDER — BOOST / RESOURCE BREEZE PO LIQD CUSTOM
1.0000 | Freq: Three times a day (TID) | ORAL | Status: DC
  Administered 2018-05-04 – 2018-05-08 (×6): 1 via ORAL

## 2018-05-04 MED ORDER — KETOROLAC TROMETHAMINE 30 MG/ML IJ SOLN
30.0000 mg | Freq: Once | INTRAMUSCULAR | Status: AC
  Administered 2018-05-04: 30 mg via INTRAVENOUS
  Filled 2018-05-04: qty 1

## 2018-05-04 NOTE — Progress Notes (Signed)
Night RN reports pt had several large BM with hard solid particles during the night. Pt states she feels relief. Pt abd is less distended and taut this am. ABD soft with normoactive BS. Will cont to monitor. SRP, RN

## 2018-05-04 NOTE — Progress Notes (Signed)
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  States she is feeling much better.  Still febrile with a temperature of 103.1 this morning and currently 100.8  Subjective May 04, 2018: Doing much better she had a good BM last night she still running temperatures T-max was 100.7 this morning and she still on O2 nasal cannula 2 L/min.  Assessment/Plan: Principal Problem:   Sepsis ([place]) Active Problems:   OSA (obstructive sleep apnea)   Influenza   Pancytopenia ([place])   Patient is Jehovah's Witness   Pulmonary embolism ([place])   Metastatic breast cancer ([place])   Malnutrition of moderate degree  1.  Sepsis sepsis protocol in place patient is on cefepime and vancomycin continue to monitor  2.  Pancytopenia with severe anemia of 4.6 patient is Jehovah witness and refuses blood transfusion.  Plan is to minimize blood draws and use pediatric tubes only will obtain another CBC tomorrow  3.  Hypertension continue amlodipine and hydralazine  4.  History of diastolic CHF presently receiving free due to sure picture but will monitor closely  5.  History of metastatic cancer patient stated that she is a 10-year survivor she is followed up by Dr. Burr Medico oncologist  6.  Chest x-ray showed some opacities.  We will continue vancomycin and cefepime   Code Status: Full  Severity of Illness: The appropriate patient status for this patient is INPATIENT. Inpatient status is judged to be reasonable and necessary in order to provide the required intensity of service to ensure the  patient's safety. The patient's presenting symptoms, physical exam findings, and initial radiographic and laboratory data in the context of their chronic comorbidities is felt to place them at high risk for further clinical deterioration. Furthermore, it is not anticipated that the patient will be medically stable for discharge from the hospital within 2 midnights of admission. The following factors support the patient status of inpatient.   " The patient's presenting symptoms include respiratory distress. " The worrisome physical exam findings include respiratory distress fever. " The initial radiographic and laboratory data are worrisome because of influenza. " The chronic co-morbidities include pulmonary embolism.   * I certify that at the point of admission it is my clinical judgment that the patient will require inpatient hospital care spanning beyond 2 midnights from the point of admission due to high intensity of service, high risk for further deterioration and high frequency of surveillance required.*    Family Communication: Daughter at bedside  Disposition Plan: Home when stable   Consultants:  None  Procedures:  None  Antimicrobials:  Cefepime  Vancomycin  DVT prophylaxis: Lovenox   Objective: Vitals:   05/03/18 1900 05/03/18 2332 05/04/18 0547 05/04/18 1322  BP:  125/66 (!) 146/70 127/62  Pulse:  80 96 85  Resp:  14 14 20   Temp: 99.1 F (37.3 C) 98.9 F (37.2 C) 100.2 F (37.9 C) 99.2 F (37.3 C)  TempSrc: Oral Oral Oral Oral  SpO2:  100% 93% 100%  Weight:      Height:        Intake/Output Summary (Last 24 hours) at 05/04/2018 1840 Last  data filed at 05/04/2018 1300 Gross per 24 hour  Intake 480 ml  Output 400 ml  Net 80 ml   Filed Weights   05/02/18 2152  Weight: 106.5 kg   Body mass index is 36.79 kg/m.  Exam:  . General: 58 y.o. year-old female well developed well nourished in no acute distress.  Alert and oriented x3. . Cardiovascular:  Regular rate and rhythm with no rubs or gallops.  No thyromegaly or JVD noted.   Marland Kitchen Respiratory: Clear to auscultation with no wheezes or rales. Good inspiratory effort. . Abdomen: Soft nontender nondistended with normal bowel sounds x4 quadrants. . Musculoskeletal: trace lower extremity edema. 2/4 pulses in all 4 extremities. . Skin: No ulcerative lesions noted or rashes, . Psychiatry: Mood is appropriate for condition and setting    Data Reviewed: CBC: Recent Labs  Lab 05/02/18 2221  WBC 3.6*  NEUTROABS 2.4  HGB 4.5*  HCT 15.9*  MCV 97.0  PLT 951*   Basic Metabolic Panel: Recent Labs  Lab 05/02/18 2221 05/04/18 0951  NA 132* 135  K 4.2 4.4  CL 101 109  CO2 22 18*  GLUCOSE 125* 94  BUN 12 10  CREATININE 0.78 0.62  CALCIUM 9.4 8.2*   GFR: Estimated Creatinine Clearance: 94 mL/min (by C-G formula based on SCr of 0.62 mg/dL). Liver Function Tests: Recent Labs  Lab 04/28/18 0447 05/02/18 2221 05/04/18 0951  AST 23 43* 37  ALT 16 22 18   ALKPHOS 93 111 79  BILITOT 0.2* 1.6* 0.5  PROT 6.3* 7.9 6.2*  ALBUMIN 2.6* 3.5 2.6*   Recent Labs  Lab 05/02/18 2221  LIPASE 31   No results for input(s): AMMONIA in the last 168 hours. Coagulation Profile: Recent Labs  Lab 05/02/18 2221  INR 1.26   Cardiac Enzymes: Recent Labs  Lab 05/02/18 2221  TROPONINI <0.03   BNP (last 3 results) No results for input(s): PROBNP in the last 8760 hours. HbA1C: No results for input(s): HGBA1C in the last 72 hours. CBG: No results for input(s): GLUCAP in the last 168 hours. Lipid Profile: No results for input(s): CHOL, HDL, LDLCALC, TRIG, CHOLHDL, LDLDIRECT in the last 72 hours. Thyroid Function Tests: No results for input(s): TSH, T4TOTAL, FREET4, T3FREE, THYROIDAB in the last 72 hours. Anemia Panel: No results for input(s): VITAMINB12, FOLATE, FERRITIN, TIBC, IRON, RETICCTPCT in the last 72 hours. Urine analysis:    Component Value Date/Time   COLORURINE YELLOW  05/03/2018 0044   APPEARANCEUR CLEAR 05/03/2018 0044   LABSPEC 1.014 05/03/2018 0044   LABSPEC 1.025 01/11/2016 1233   PHURINE 6.0 05/03/2018 0044   GLUCOSEU NEGATIVE 05/03/2018 0044   GLUCOSEU Negative 01/11/2016 1233   HGBUR NEGATIVE 05/03/2018 0044   BILIRUBINUR NEGATIVE 05/03/2018 0044   BILIRUBINUR small 12/03/2016 1117   BILIRUBINUR Negative 01/11/2016 1233   KETONESUR NEGATIVE 05/03/2018 0044   PROTEINUR NEGATIVE 05/03/2018 0044   UROBILINOGEN 1.0 12/03/2016 1117   UROBILINOGEN 1.0 09/13/2016 1306   UROBILINOGEN 0.2 01/11/2016 1233   NITRITE NEGATIVE 05/03/2018 0044   LEUKOCYTESUR NEGATIVE 05/03/2018 0044   LEUKOCYTESUR Small 01/11/2016 1233   Sepsis Labs: @LABRCNTIP (procalcitonin:4,lacticidven:4)  ) Recent Results (from the past 240 hour(s))  Urine culture     Status: Abnormal   Collection Time: 04/25/18  4:12 AM  Result Value Ref Range Status   Specimen Description   Final    URINE, CLEAN CATCH Performed at [facility], [place] [address] [place], [place] [address]    Special Requests  Final    NONE Performed at [facility], [place] [address], [place], [place] [address]    Culture (A)  Final    <10,000 COLONIES/mL INSIGNIFICANT GROWTH Performed at [place] [address] [place], [place] [address]    Report Status 04/26/2018 FINAL  Final  Blood culture (routine x 2)     Status: None   Collection Time: 04/25/18  5:30 AM  Result Value Ref Range Status   Specimen Description   Final    BLOOD RIGHT HAND Performed at [place] [address] [place], [place] [address]    Special Requests   Final    BOTTLES DRAWN AEROBIC AND ANAEROBIC Blood Culture results may not be optimal due to an excessive volume of blood received in culture bottles Performed at [place] [address], [place], [place] [address]    Culture   Final    NO GROWTH 5 DAYS Performed at [facility], [place] [address], [place], [place] [address]    Report Status 04/30/2018 FINAL  Final  Blood culture (routine x 2)     Status: None   Collection Time: 04/25/18  5:30 AM  Result Value Ref Range Status   Specimen Description   Final    BLOOD RIGHT ANTECUBITAL Performed at [place] [address], [place], [place] [address]    Special Requests   Final    BOTTLES DRAWN AEROBIC AND ANAEROBIC Blood Culture adequate volume Performed at [place] [address] [place], [place] [address]    Culture   Final    NO GROWTH 5 DAYS Performed at [facility], [place] [address] [place], [place] [address]    Report Status 04/30/2018 FINAL  Final  Respiratory Panel by PCR     Status: None   Collection Time: 04/25/18  6:23 PM  Result Value Ref Range Status   Adenovirus NOT DETECTED NOT DETECTED Final   Coronavirus 229E NOT DETECTED NOT DETECTED Final   Coronavirus HKU1 NOT DETECTED NOT DETECTED Final   Coronavirus NL63 NOT DETECTED NOT DETECTED Final   Coronavirus OC43 NOT DETECTED NOT DETECTED Final   Metapneumovirus NOT DETECTED NOT DETECTED Final   Rhinovirus / Enterovirus NOT DETECTED NOT DETECTED Final   Influenza A NOT DETECTED NOT DETECTED Final   Influenza B NOT DETECTED NOT DETECTED Final   Parainfluenza Virus 1 NOT DETECTED NOT DETECTED Final   Parainfluenza Virus 2 NOT DETECTED NOT DETECTED Final   Parainfluenza Virus 3 NOT DETECTED NOT DETECTED Final   Parainfluenza Virus 4 NOT DETECTED NOT DETECTED Final   Respiratory Syncytial Virus NOT DETECTED NOT DETECTED Final   Bordetella pertussis NOT DETECTED NOT DETECTED Final   Chlamydophila pneumoniae NOT DETECTED NOT DETECTED Final   Mycoplasma pneumoniae NOT DETECTED NOT DETECTED Final    Comment: Performed at [facility], [place] [address] [place], [place] [address]  Blood Culture (routine x 2)     Status: None (Preliminary result)   Collection Time: 05/02/18 10:21 PM    Result Value Ref Range Status   Specimen Description   Final    BLOOD RIGHT ANTECUBITAL Performed at [place] [address], [place], [place] [address]    Special Requests   Final    BOTTLES DRAWN AEROBIC AND ANAEROBIC Blood Culture adequate volume Performed at [facility] [place] [address] [place], [place] [address]  Culture   Final    NO GROWTH 1 DAY Performed at [facility], [place] [address], [place], [place] [address]    Report Status PENDING  Incomplete  Urine culture     Status: None   Collection Time: 05/03/18 12:44 AM  Result Value Ref Range Status   Specimen Description URINE, RANDOM  Final   Special Requests   Final    Immunocompromised Performed at [place] [address] [place], [place] [address]    Culture NO GROWTH  Final   Report Status 05/04/2018 FINAL  Final  MRSA PCR Screening     Status: None   Collection Time: 05/03/18  9:57 AM  Result Value Ref Range Status   MRSA by PCR NEGATIVE NEGATIVE Final    Comment:        The GeneXpert MRSA Assay (FDA approved for NASAL specimens only), is one component of a comprehensive MRSA colonization surveillance program. It is not intended to diagnose MRSA infection nor to guide or monitor treatment for MRSA infections. Performed at [facility], [place] [address] [place], [place] [address]       Studies: Dg Chest Port 1 View  Result Date: 05/04/2018 CLINICAL DATA:  Shortness of breath. EXAM: PORTABLE CHEST 1 VIEW COMPARISON:  Chest radiograph 05/02/2018 FINDINGS: Monitoring leads overlie the patient. Stable cardiomegaly. Mild bilateral interstitial opacities bilaterally. No pleural effusion or pneumothorax. Read demonstrated osseous metastatic disease. IMPRESSION: Mild bilateral interstitial opacities may represent edema or atypical infectious process. Electronically Signed   By: Lovey Newcomer M.D.   On: 05/04/2018 13:59     Scheduled Meds: . amLODipine  10 mg Oral Daily  . enoxaparin  100 mg Subcutaneous Q12H  . feeding supplement  1 Container Oral TID BM  . fluticasone  1 spray Each Nare Daily  . folic acid  1 mg Oral Daily  . hydrALAZINE  10 mg Oral TID  . lactulose  10-20 g Oral TID  . loratadine  10 mg Oral Daily  . oseltamivir  75 mg Oral BID  . polyethylene glycol  17 g Oral Daily  . cyanocobalamin  1,000 mcg Oral Daily    Continuous Infusions: . ceFEPime (MAXIPIME) IV 1 g (05/04/18 1304)  . vancomycin 1,250 mg (05/04/18 1023)     LOS: 1 day     Cristal Deer, MD [facility]  To reach me or the doctor on call, go to: [email or web address] Password [facility]  05/04/2018, 6:40 PM

## 2018-05-04 NOTE — Progress Notes (Signed)
Initial Nutrition Assessment  DOCUMENTATION CODES:   Obesity unspecified, Non-severe (moderate) malnutrition in context of acute illness/injury  INTERVENTION:   Provide Boost Breeze po TID, each supplement provides 250 kcal and 9 grams of protein  NUTRITION DIAGNOSIS:   Moderate Malnutrition related to acute illness, poor appetite(UTI, flu+) as evidenced by percent weight loss, energy intake < or equal to 75% for > or equal to 1 month.  GOAL:   Patient will meet greater than or equal to 90% of their needs  MONITOR:   PO intake, Supplement acceptance, Labs, Weight trends, I & O's  REASON FOR ASSESSMENT:   Malnutrition Screening Tool    ASSESSMENT:   58 y.o. female with history of metastatic breast cancer, pulmonary embolism on Lovenox, pancytopenia, Jehovah's Witness, hypertension who was recently admitted for fever presumed to be secondary to UTI. Admitted with fever, chills and cough x 3 days.    Patient familiar to RD from previous admissions (1/24-1/28, 1/31-2/3). During these admissions pt had variable intakes. Pt has had weight loss since an appendectomy in May 2019.   Pt readmitted for fever, chills related to being flu+.  Pt ate 80% of breakfast yesterday. Pt feeling better now. Requests to have a less milky supplement. Will order Boost Breeze supplements.  Pt's weight has increased since last admission on 2/2. Pt still with overall weight loss since 01/01/18 (10% wt loss x 4 months, significant for time frame).   Labs reviewed. Medications: Folic acid tablet daily, Miralax packet daily, Vitamin B-12 tablet daily  NUTRITION - FOCUSED PHYSICAL EXAM:  Nutrition focused physical exam shows no sign of depletion of muscle mass or body fat.  Diet Order:   Diet Order            Diet Heart Room service appropriate? Yes; Fluid consistency: Thin  Diet effective now              EDUCATION NEEDS:   No education needs have been identified at this time  Skin:  Skin  Assessment: Reviewed RN Assessment  Last BM:  2/4  Height:   Ht Readings from Last 1 Encounters:  05/02/18 5\' 7"  (1.702 m)    Weight:   Wt Readings from Last 1 Encounters:  05/02/18 106.5 kg    Ideal Body Weight:  61.3 kg  BMI:  Body mass index is 36.79 kg/m.  Estimated Nutritional Needs:   Kcal:  1800-2000  Protein:  70-80g  Fluid:  2L/day  Clayton Bibles, MS, RD, LDN [place] Dietitian Pager: [phone] After Hours Pager: [phone]

## 2018-05-04 NOTE — Progress Notes (Signed)
Pt complains of sharp pain in middle of chest with some "shooting" to right shoulder. Pt said it comes and goes. On call provider made aware via text page. Will CTM.  Julio Alm, RN

## 2018-05-05 LAB — TROPONIN I: Troponin I: <0.03 ng/mL (ref <0.03)

## 2018-05-05 LAB — HEMOGLOBIN AND HEMATOCRIT, BLOOD
HCT: 13 % — ABNORMAL LOW (ref 36.0–46.0)
Hemoglobin: 3.5 g/dL — CL (ref 12.0–15.0)

## 2018-05-05 MED ORDER — VANCOMYCIN HCL IN DEXTROSE 750-5 MG/150ML-% IV SOLN
750.0000 mg | Freq: Two times a day (BID) | INTRAVENOUS | Status: DC
  Filled 2018-05-05: qty 150

## 2018-05-05 MED ORDER — HYDROCOD POLST-CPM POLST ER 10-8 MG/5ML PO SUER
5.0000 mL | Freq: Two times a day (BID) | ORAL | Status: DC
  Administered 2018-05-05 – 2018-05-08 (×4): 5 mL via ORAL
  Filled 2018-05-05 (×6): qty 5

## 2018-05-05 MED ORDER — DM-GUAIFENESIN ER 30-600 MG PO TB12
1.0000 | ORAL_TABLET | Freq: Two times a day (BID) | ORAL | Status: DC
  Administered 2018-05-05 – 2018-05-08 (×7): 1 via ORAL
  Filled 2018-05-05 (×7): qty 1

## 2018-05-05 NOTE — Progress Notes (Signed)
Called RR due to pt low HGB and c/o chest pain and shortness of breath. No significant changes in patient upon reassessment. VSS. Awaiting new orders. Julio Alm, RN

## 2018-05-05 NOTE — Progress Notes (Signed)
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  No nausea no vomiting.  No blood in the stool.  No diarrhea reported.  No abdominal pain.  Chest pain is reported as continues sharp pain worsening with breathing and pressure.  Assessment/Plan: Principal Problem:   Sepsis ([place]) Active Problems:   OSA (obstructive sleep apnea)   Influenza   Pancytopenia ([place])   Patient is Jehovah's Witness   Pulmonary embolism ([place])   Metastatic breast cancer ([place])   Malnutrition of moderate degree  1.  Sepsis secondary to influenza. Pneumonia ruled out. Patient was started on IV antibiotics along with Tamiflu. We will discontinue biotics for now.  2.  Pancytopenia with severe anemia patient is Jehovah witness and refuses blood transfusion.  Plan is to minimize blood draws and use pediatric tubes only Discussed with hematology on-call. It is okay to use patient's therapeutic anticoagulation as long as she does not have any bleeding and as long as her creatinine remained stable. Recommend not to recheck CBC unless there is a concern clinically. I requested palliative care consultation for goals of care discussion.  Last visit note from oncologist recommend DNR/DNI consideration.  3.  Hypertension continue amlodipine and hold hydralazine  4.  History of diastolic CHF presently receiving free due to sure picture but will monitor closely  5.  History of metastatic cancer patient stated that she is a 10-year survivor she is followed up by Dr. Burr Medico oncologist  6.   Chest x-ray showed some opacities.  Less likely pneumonia.  Will monitor.  7.  Chest pain. Pleuritic in nature. EKG unremarkable. Troponin x3 are also negative. No further work-up currently recommended.  Code Status: Full   Family Communication: Daughter at bedside  Disposition Plan: Home when stable   Consultants:  None  Procedures:  None  Antimicrobials:  Cefepime  Vancomycin  DVT prophylaxis: Lovenox   Objective: Vitals:   05/04/18 2243 05/05/18 0505 05/05/18 0900 05/05/18 1336  BP: 121/62 109/70 115/64 (!) 116/53  Pulse: 85 70  82  Resp:  14  (!) 22  Temp:  98.6 F (37 C)  98.8 F (37.1 C)  TempSrc:  Oral  Oral  SpO2: (!) 89% 100%  100%  Weight:      Height:        Intake/Output Summary (Last 24 hours) at 05/05/2018 1917 Last data filed at 05/05/2018 1339 Gross per 24 hour  Intake 1529.36 ml  Output 650 ml  Net 879.36 ml   Filed Weights   05/02/18 2152  Weight: 106.5 kg   Body mass index is 36.79 kg/m.  Exam:  . General: 57 y.o. year-old female well developed well nourished in no acute distress.  Alert and oriented x3. . Cardiovascular: Regular rate and rhythm with no rubs or gallops.  No thyromegaly or JVD noted.   Marland Kitchen Respiratory: Clear to auscultation with no wheezes or rales. Good inspiratory effort. . Abdomen: Soft nontender nondistended with normal bowel sounds x4 quadrants. . Musculoskeletal: trace lower extremity edema. 2/4 pulses in all 4 extremities. . Skin: No ulcerative lesions noted or rashes, . Psychiatry: Mood is appropriate  for condition and setting    Data Reviewed: CBC: Recent Labs  Lab 05/02/18 2221 05/05/18 0435  WBC 3.6*  --   NEUTROABS 2.4  --   HGB 4.5* 3.5*  HCT 15.9* 13.0*  MCV 97.0  --   PLT 142*  --    Basic Metabolic Panel: Recent Labs  Lab 05/02/18 2221 05/04/18 0951  NA 132* 135  K 4.2 4.4  CL 101 109  CO2 22 18*  GLUCOSE 125* 94  BUN 12 10  CREATININE 0.78 0.62  CALCIUM 9.4 8.2*    GFR: Estimated Creatinine Clearance: 94 mL/min (by C-G formula based on SCr of 0.62 mg/dL). Liver Function Tests: Recent Labs  Lab 05/02/18 2221 05/04/18 0951  AST 43* 37  ALT 22 18  ALKPHOS 111 79  BILITOT 1.6* 0.5  PROT 7.9 6.2*  ALBUMIN 3.5 2.6*   Recent Labs  Lab 05/02/18 2221  LIPASE 31   No results for input(s): AMMONIA in the last 168 hours. Coagulation Profile: Recent Labs  Lab 05/02/18 2221  INR 1.26   Cardiac Enzymes: Recent Labs  Lab 05/02/18 2221 05/05/18 1004  TROPONINI <0.03 <0.03   BNP (last 3 results) No results for input(s): PROBNP in the last 8760 hours. HbA1C: No results for input(s): HGBA1C in the last 72 hours. CBG: No results for input(s): GLUCAP in the last 168 hours. Lipid Profile: No results for input(s): CHOL, HDL, LDLCALC, TRIG, CHOLHDL, LDLDIRECT in the last 72 hours. Thyroid Function Tests: No results for input(s): TSH, T4TOTAL, FREET4, T3FREE, THYROIDAB in the last 72 hours. Anemia Panel: No results for input(s): VITAMINB12, FOLATE, FERRITIN, TIBC, IRON, RETICCTPCT in the last 72 hours. Urine analysis:    Component Value Date/Time   COLORURINE YELLOW 05/03/2018 0044   APPEARANCEUR CLEAR 05/03/2018 0044   LABSPEC 1.014 05/03/2018 0044   LABSPEC 1.025 01/11/2016 1233   PHURINE 6.0 05/03/2018 0044   GLUCOSEU NEGATIVE 05/03/2018 0044   GLUCOSEU Negative 01/11/2016 1233   HGBUR NEGATIVE 05/03/2018 0044   BILIRUBINUR NEGATIVE 05/03/2018 0044   BILIRUBINUR small 12/03/2016 1117   BILIRUBINUR Negative 01/11/2016 1233   KETONESUR NEGATIVE 05/03/2018 0044   PROTEINUR NEGATIVE 05/03/2018 0044   UROBILINOGEN 1.0 12/03/2016 1117   UROBILINOGEN 1.0 09/13/2016 1306   UROBILINOGEN 0.2 01/11/2016 1233   NITRITE NEGATIVE 05/03/2018 0044   LEUKOCYTESUR NEGATIVE 05/03/2018 0044   LEUKOCYTESUR Small 01/11/2016 1233   Sepsis Labs: @LABRCNTIP (procalcitonin:4,lacticidven:4)  ) Recent Results (from the past 240 hour(s))  Blood Culture  (routine x 2)     Status: None (Preliminary result)   Collection Time: 05/02/18 10:21 PM  Result Value Ref Range Status   Specimen Description   Final    BLOOD RIGHT ANTECUBITAL Performed at [facility], [place] [address] [place], [place] [address]    Special Requests   Final    BOTTLES DRAWN AEROBIC AND ANAEROBIC Blood Culture adequate volume Performed at [place] [address] [place], [place] [address]    Culture   Final    NO GROWTH 2 DAYS Performed at [place] [address], [place], [place] [address]    Report Status PENDING  Incomplete  Urine culture     Status: None   Collection Time: 05/03/18 12:44 AM  Result Value Ref Range Status   Specimen Description URINE, RANDOM  Final   Special Requests   Final    Immunocompromised Performed at [place] [address], [place], [place] [address]  Culture NO GROWTH  Final   Report Status 05/04/2018 FINAL  Final  MRSA PCR Screening     Status: None   Collection Time: 05/03/18  9:57 AM  Result Value Ref Range Status   MRSA by PCR NEGATIVE NEGATIVE Final    Comment:        The GeneXpert MRSA Assay (FDA approved for NASAL specimens only), is one component of a comprehensive MRSA colonization surveillance program. It is not intended to diagnose MRSA infection nor to guide or monitor treatment for MRSA infections. Performed at [facility], [place] [address], [place], [place] [address]       Studies: No results found.  Scheduled Meds: . amLODipine  10 mg Oral Daily  . chlorpheniramine-HYDROcodone  5 mL Oral Q12H  . dextromethorphan-guaiFENesin  1 tablet Oral BID  . enoxaparin  100 mg Subcutaneous Q12H  . feeding supplement  1 Container Oral TID BM  . fluticasone  1 spray Each Nare Daily  . folic acid  1 mg Oral Daily  . lactulose  10-20 g Oral TID  . oseltamivir  75 mg Oral BID  . polyethylene glycol  17 g Oral Daily  .  cyanocobalamin  1,000 mcg Oral Daily    Continuous Infusions:    LOS: 2 days     Berle Mull, MD [facility]  To reach me or the doctor on call, go to: [email or web address]  05/05/2018, 7:17 PM

## 2018-05-05 NOTE — Progress Notes (Signed)
Pharmacy Antibiotic Note  Michaela Little is a 58 y.o. female admitted on 05/02/2018 with sepsis.  Pharmacy has been consulted for cefepime/vancomycin dosing for PNA/sepsis.  Pt is influenza positive on Tamiflu.  Today, 05/05/18  SCr 0.62 (on 2/9), CrCl ~85 mL/min  Afebrile  Plan:  Continue cefepime 1 g IV q8h  Reduce vancomycin dose to 750 mg IV q12h  Goal AUC 400-500  Monitor culture data, renal function, and vancomycin levels once at steady state as indicated  Per MD notes, minimize lab draws as possible   Height: 5\' 7"  (170.2 cm) Weight: 234 lb 14.4 oz (106.5 kg) IBW/kg (Calculated) : 61.6  Temp (24hrs), Avg:99.2 F (37.3 C), Min:98.6 F (37 C), Max:99.8 F (37.7 C)  Recent Labs  Lab 05/02/18 2221 05/03/18 1019 05/03/18 1400 05/04/18 0951  WBC 3.6*  --   --   --   CREATININE 0.78  --   --  0.62  LATICACIDVEN 1.6 1.3 1.2  --     Estimated Creatinine Clearance: 94 mL/min (by C-G formula based on SCr of 0.62 mg/dL).    Allergies  Allergen Reactions  . Lisinopril-Hydrochlorothiazide Itching  . Adhesive [Tape] Itching and Other (See Comments)    Redness  . Fentanyl Other (See Comments)    GI upset and drowsiness. Only to [facility]  . Gabapentin Other (See Comments)    Auditory hallucinations  . Hctz [Hydrochlorothiazide] Itching  . Latex Itching  . Lisinopril Itching  . Losartan Potassium Other (See Comments)    Makes her feel "bad"   . Other Other (See Comments)    Patient refuses blood for religious reasons (per her notes)  . Oxycodone     GI upset, drowsy  . Tums [Calcium Carbonate Antacid] Hives    FRUIT-FLAVORED ONES    Antimicrobials this admission: 2/7 cefepime >>  2/7 vancomycin >>   Dose adjustments this admission: -2/10: vancomycin 1250 mg IV q12h --> 750 mg IV q12h  Microbiology results: 2/7 BCx: ngtd 2/8 UCx: ngf   2/8 MRSA PCR: negative  Thank you for allowing pharmacy to be a part of this patient's care.  [name],  PharmD 05/05/2018 7:23 AM

## 2018-05-05 NOTE — Progress Notes (Signed)
CRITICAL VALUE ALERT  Critical Value:  HGB: 3.5  Date & Time Notied:  05/05/2018 @ 0503  Provider Notified: X. Blount via text page  Orders Received/Actions taken: awaiting orders  Julio Alm, RN

## 2018-05-05 NOTE — Discharge Summary (Signed)
Physician Discharge Summary  Michaela Little KDX:[id number] DOB: 12-05-1960 DOA: 04/24/2018  PCP: Ladell Pier, MD  Admit date: 04/24/2018 Discharge date: 04/28/2018  Admitted From: Home Disposition:  Home.   Recommendations for Outpatient Follow-up:  1. Follow up with PCP in 1-2 weeks 2. Please obtain BMP/CBC in one week Please follow up with oncology as recommended.   Discharge Condition:guarded.  CODE STATUS:full code.  Diet recommendation: Heart Healthy  Brief/Interim Summary:   Michaela J Scottis a 58 y.o.femalewithhistory of metastatic breast cancer, diastolic CHF, hypertension, anemia and pancytopenia Jehovah's Witness presents to the ER after patient has been having subjective feeling of fever chills last 3 days. Patient was just discharged 3 days ago after being admitted for hypercalcemia at that time patient was treated with IV bisphosphonate calcitonin Lasix. Patient's calcium improved at discharge time and but states he has she has been developing some fever and dysuria for the last 3 days.  Discharge Diagnoses:  Principal Problem:   SIRS (systemic inflammatory response syndrome) (HCC) Active Problems:   OSA (obstructive sleep apnea)   Essential hypertension   Pancytopenia ([place])   Patient is Jehovah's Witness   Pulmonary embolism ([place])   Metastatic breast cancer ([place])  Metastatic breast cancer: Further management as per Dr Burr Medico.  Symptomatic management with pain meds.    Pancytopenia:  Pt is a Jehovah's witness.  Minimize blood draws and use pediatric tubes.  Discussed with Dr Burr Medico , recommended symptomatic management without any transfusions.    Fevers and possible UTI: Urine cultures show less than 1000 growth.   will discontinue IV antibiotics.  Blood cultures have been negative.  Influenza pcr is negarive.    Hypertension; well controlled.   Diastolic heart failure:  Stable.   Pt reports new right sided upper quadrant pain.  [place]  abd done, showed metastases.   Discharge Instructions  Discharge Instructions    Diet - low sodium heart healthy   Complete by:  As directed    Increase activity slowly   Complete by:  As directed      Allergies as of 04/28/2018      Reactions   Lisinopril-hydrochlorothiazide Itching   Adhesive [tape] Itching, Other (See Comments)   Redness   Fentanyl Other (See Comments)   GI upset and drowsiness. Only to [facility]   Gabapentin Other (See Comments)   Auditory hallucinations   Hctz [hydrochlorothiazide] Itching   Latex Itching   Lisinopril Itching   Losartan Potassium Other (See Comments)   Makes her feel "bad"   Other Other (See Comments)   Patient refuses blood for religious reasons (per her notes)   Oxycodone    GI upset, drowsy   Tums [calcium Carbonate Antacid] Hives   FRUIT-FLAVORED ONES      Medication List    STOP taking these medications   enoxaparin 120 MG/0.8ML injection Commonly known as:  LOVENOX     TAKE these medications   acetaminophen 500 MG tablet Commonly known as:  TYLENOL Take 500 mg by mouth every 6 (six) hours as needed for mild pain.   amLODipine 10 MG tablet Commonly known as:  NORVASC Take 1 tablet (10 mg total) by mouth daily.   cetirizine 10 MG tablet Commonly known as:  ZYRTEC Take 10 mg by mouth daily.   cyanocobalamin 1000 MCG tablet Take 1 tablet (1,000 mcg total) by mouth daily.   docusate sodium 100 MG capsule Commonly known as:  COLACE Take 1 capsule (100 mg total) by mouth daily  as needed for mild constipation.   feeding supplement (ENSURE ENLIVE) Liqd Take 237 mLs by mouth 2 (two) times daily between meals.   fluticasone 50 MCG/ACT nasal spray Commonly known as:  FLONASE Place 1 spray into both nostrils daily.   fluticasone furoate-vilanterol 100-25 MCG/INH Aepb Commonly known as:  BREO ELLIPTA Inhale 1 puff into the lungs daily. What changed:    when to take this  reasons to take this   folic acid 1  MG tablet Commonly known as:  FOLVITE Take 1 tablet (1 mg total) by mouth daily.   hydrALAZINE 10 MG tablet Commonly known as:  APRESOLINE Take 1 tablet (10 mg total) by mouth 3 (three) times daily.   ipratropium-albuterol 0.5-2.5 (3) MG/3ML Soln Commonly known as:  DUONEB INHALE THREE MLS VIA NEBULIZER EVERY 6 HOURS AS NEEDED What changed:  See the new instructions.   MULTIVITAMIN ADULT PO Take 1 tablet by mouth daily.   polyethylene glycol packet Commonly known as:  MIRALAX / GLYCOLAX Take 17 g by mouth daily.   traMADol 50 MG tablet Commonly known as:  ULTRAM Take 2 tablets (100 mg total) by mouth every 6 (six) hours. What changed:    when to take this  reasons to take this      Follow-up Information    Ladell Pier, MD. Schedule an appointment as soon as possible for a visit in 1 week(s).   Specialty:  Internal Medicine Contact information: [address] [place]  [address] [phone]          Allergies  Allergen Reactions  . Lisinopril-Hydrochlorothiazide Itching  . Adhesive [Tape] Itching and Other (See Comments)    Redness  . Fentanyl Other (See Comments)    GI upset and drowsiness. Only to [facility]  . Gabapentin Other (See Comments)    Auditory hallucinations  . Hctz [Hydrochlorothiazide] Itching  . Latex Itching  . Lisinopril Itching  . Losartan Potassium Other (See Comments)    Makes her feel "bad"   . Other Other (See Comments)    Patient refuses blood for religious reasons (per her notes)  . Oxycodone     GI upset, drowsy  . Tums [Calcium Carbonate Antacid] Hives    FRUIT-FLAVORED ONES    Consultations:  Oncology.    Procedures/Studies: Dg Chest 2 View  Result Date: 05/02/2018 CLINICAL DATA:  Initial evaluation for acute fever, productive cough. EXAM: CHEST - 2 VIEW COMPARISON:  Prior radiograph from 04/25/2018 FINDINGS: Cardiac and mediastinal silhouettes are stable in size and contour, and remain within normal  limits. Lungs normally inflated. Patchy infiltrate overlies the mid right lung base, concerning for infiltrate given the provided history of cough and fever. Few scattered additional opacities at the left lung base as well. No edema or effusion. No pneumothorax. Surgical clips overlie the left axilla.  No acute osseous finding. IMPRESSION: Scattered multifocal patchy bibasilar opacities, suspicious for possible infectious infiltrates given provided history of cough and fever. Electronically Signed   By: Jeannine Boga M.D.   On: 05/02/2018 23:25   Dg Chest 2 View  Result Date: 04/25/2018 CLINICAL DATA:  Fever and right flank pain since discharge. History of breast cancer. EXAM: CHEST - 2 VIEW COMPARISON:  Chest CT 04/02/2018 FINDINGS: Osteolytic metastatic disease of the included dorsal spine, shoulders, manubrium and sternum. Top-normal heart size. Nonaneurysmal thoracic aorta. No acute pulmonary consolidation, edema, effusion or pneumothorax. Left axillary clips are present. IMPRESSION: 1. No active cardiopulmonary disease. 2. Diffuse metastatic disease to  bone. Electronically Signed   By: Ashley Royalty M.D.   On: 04/25/2018 00:55   Dg Abdomen 1 View  Result Date: 05/02/2018 CLINICAL DATA:  Initial evaluation for constipation. EXAM: ABDOMEN - 1 VIEW COMPARISON:  Prior CT from 04/02/2018 FINDINGS: Bowel gas pattern is nonobstructive. No abnormal bowel wall thickening. Large volume retained stool within the rectal vault/distal rectosigmoid colon. No abnormal calcification. IVC filter and cholecystectomy clips noted. 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 IMPRESSION: 1. Large volume retained stool within the distal rectosigmoid colon/rectal vault, compatible with constipation. 2. 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  Electronically Signed   By: Jeannine Boga M.D.   On: 05/02/2018 23:27   Dg Chest Port 1 View  Result Date: 05/04/2018 CLINICAL DATA:  Shortness of breath. EXAM: PORTABLE CHEST 1 VIEW COMPARISON:  Chest radiograph 05/02/2018 FINDINGS: Monitoring leads overlie the patient. Stable cardiomegaly. Mild bilateral interstitial opacities bilaterally. No pleural effusion or pneumothorax. Read demonstrated osseous metastatic disease. IMPRESSION: Mild bilateral interstitial opacities may represent edema or atypical infectious process. Electronically Signed   By: Lovey Newcomer M.D.   On: 05/04/2018 13:59   Dg Chest Port 1 View  Result Date: 04/18/2018 CLINICAL DATA:  Productive cough and shortness of breath. History of breast cancer and hypertension. EXAM: PORTABLE CHEST 1 VIEW COMPARISON:  03/08/2018 and chest CTA dated 04/02/2018. FINDINGS: Stable mildly enlarged cardiac silhouette. Clear lungs with normal vascularity. Mild patchy bone sclerosis and multiple small lucent lesions in both shoulders. This was diffusely involving the regional skeleton on the recent CTA. Left axillary surgical clips. IMPRESSION: No acute abnormality. Stable mild cardiomegaly and diffuse bony metastatic disease. Electronically Signed   By: Claudie Revering M.D.   On: 04/18/2018 19:10   Ct Renal Stone Study  Result Date: 05/03/2018 CLINICAL DATA:  Abdominal pain.  Sepsis.  History of breast cancer. EXAM: CT ABDOMEN AND PELVIS WITHOUT CONTRAST TECHNIQUE: Multidetector CT imaging of the abdomen and pelvis was performed following the standard protocol without IV contrast. COMPARISON:  CT scan 04/02/2017 FINDINGS: Lower chest: Patchy bibasilar infiltrates suspicious for pneumonia or aspiration. No pleural effusion. Heart is normal in size. No pericardial effusion. Hepatobiliary: Several vague ill-defined low-attenuation liver lesions are noted and highly suspicious for metastatic disease. Significant progression when compared to prior study  from chest 1 month ago. The gallbladder is surgically absent. Mild stable common bile duct dilatation. Pancreas: No mass, inflammation or ductal dilatation. Spleen: Mild splenomegaly.  No definite splenic lesions. Adrenals/Urinary Tract: Thickened adrenal glands appears stable. No obvious lesions. There is a small lower pole right renal calculus but no obstructing ureteral calculi or bladder calculi. Lower pole right renal cyst is noted. The bladder is unremarkable. Stomach/Bowel: The stomach, duodenum, small bowel and colon are grossly normal. No acute inflammatory changes, mass lesions or obstructive findings. There is a large amount of stool throughout the colon and down into the rectum suggesting constipation. Vascular/Lymphatic: Scattered distal aortic and common iliac artery calcifications but no aneurysm. An IVC filter is noted. No mesenteric or retroperitoneal mass or lymphadenopathy. Reproductive: Surgically absent. Other: No free pelvic fluid collections or pelvic adenopathy. No inguinal adenopathy the. Musculoskeletal: Stable appearing diffuse osseous metastatic disease. No obvious pathologic fracture or spinal canal compromise. Left mastectomy noted. IMPRESSION: 1. No acute abdominal/pelvic findings. 2. Patchy bibasilar infiltrates. 3. Significant progression of hepatic metastatic disease since prior CT scan from 1 month  ago. 4. Stable diffuse osseous metastatic disease. 5. Status post cholecystectomy with stable mild common bile duct dilatation. 6. Large amount of stool throughout the colon and down into the rectum suggesting constipation. Electronically Signed   By: Marijo Sanes M.D.   On: 05/03/2018 10:01   Ir Radiologist Eval & Mgmt  Result Date: 04/08/2018 Please refer to notes tab for details about interventional procedure. (Op Note)  US Abdomen Limited Ruq  Result Date: 04/27/2018 CLINICAL DATA:  58 year old female with RIGHT UPPER quadrant abdominal pain. History of cholecystectomy and  breast cancer. EXAM: ULTRASOUND ABDOMEN LIMITED RIGHT UPPER QUADRANT COMPARISON:  04/02/2018 CT prior studies FINDINGS: Gallbladder: The gallbladder is not visualized compatible with cholecystectomy. Common bile duct: Diameter: 4 mm. No evidence of intrahepatic or extrahepatic biliary dilatation. Liver: At least 3 hypoechoic hepatic masses are noted including a 2.8 cm mass in the RIGHT liver, 1.9 cm mass in the RIGHT liver and a 1.4 cm mass in the LEFT liver. Portal vein is patent on color Doppler imaging with normal direction of blood flow towards the liver. IMPRESSION: 1. No acute abnormality. 2. At least 3 hypoechoic hepatic masses worrisome for metastatic disease. These were identified on recent CT. 3. No biliary dilatation. Electronically Signed   By: Margarette Canada M.D.   On: 04/27/2018 18:55       Subjective: No complaints.   Discharge Exam: Vitals:   04/28/18 0919 04/28/18 1344  BP: 123/63 128/69  Pulse: 93 88  Resp:  15  Temp:  98.5 F (36.9 C)  SpO2: 100% 100%   Vitals:   04/27/18 2037 04/28/18 0442 04/28/18 0919 04/28/18 1344  BP: 123/61 (!) 128/55 123/63 128/69  Pulse: 86 86 93 88  Resp: 18 18  15   Temp: 98.6 F (37 C) 98.4 F (36.9 C)  98.5 F (36.9 C)  TempSrc: Oral Oral  Oral  SpO2: 100% 100% 100% 100%  Weight:      Height:        General: Pt is alert, awake, not in acute distress Cardiovascular: RRR, S1/S2 +, no rubs, no gallops Respiratory: CTA bilaterally, no wheezing, no rhonchi Abdominal: Soft, NT, ND, bowel sounds + Extremities: no edema, no cyanosis    The results of significant diagnostics from this hospitalization (including imaging, microbiology, ancillary and laboratory) are listed below for reference.     Microbiology: Recent Results (from the past 240 hour(s))  Respiratory Panel by PCR     Status: None   Collection Time: 04/25/18  6:23 PM  Result Value Ref Range Status   Adenovirus NOT DETECTED NOT DETECTED Final   Coronavirus 229E NOT  DETECTED NOT DETECTED Final   Coronavirus HKU1 NOT DETECTED NOT DETECTED Final   Coronavirus NL63 NOT DETECTED NOT DETECTED Final   Coronavirus OC43 NOT DETECTED NOT DETECTED Final   Metapneumovirus NOT DETECTED NOT DETECTED Final   Rhinovirus / Enterovirus NOT DETECTED NOT DETECTED Final   Influenza A NOT DETECTED NOT DETECTED Final   Influenza B NOT DETECTED NOT DETECTED Final   Parainfluenza Virus 1 NOT DETECTED NOT DETECTED Final   Parainfluenza Virus 2 NOT DETECTED NOT DETECTED Final   Parainfluenza Virus 3 NOT DETECTED NOT DETECTED Final   Parainfluenza Virus 4 NOT DETECTED NOT DETECTED Final   Respiratory Syncytial Virus NOT DETECTED NOT DETECTED Final   Bordetella pertussis NOT DETECTED NOT DETECTED Final   Chlamydophila pneumoniae NOT DETECTED NOT DETECTED Final   Mycoplasma pneumoniae NOT DETECTED NOT DETECTED Final    Comment: Performed  at [facility], [place] [address], [place], [place] [address]  Blood Culture (routine x 2)     Status: None (Preliminary result)   Collection Time: 05/02/18 10:21 PM  Result Value Ref Range Status   Specimen Description   Final    BLOOD RIGHT ANTECUBITAL Performed at [facility] [address], [place], [place] [address]    Special Requests   Final    BOTTLES DRAWN AEROBIC AND ANAEROBIC Blood Culture adequate volume Performed at [facility] [address] [place], [place] [address]    Culture   Final    NO GROWTH 1 DAY Performed at [facility], [place] [address], [place], [place] [address]    Report Status PENDING  Incomplete  Urine culture     Status: None   Collection Time: 05/03/18 12:44 AM  Result Value Ref Range Status   Specimen Description URINE, RANDOM  Final   Special Requests   Final    Immunocompromised Performed at [facility] [address] [place], [place] [address]    Culture NO GROWTH  Final   Report Status 05/04/2018 FINAL  Final  MRSA PCR  Screening     Status: None   Collection Time: 05/03/18  9:57 AM  Result Value Ref Range Status   MRSA by PCR NEGATIVE NEGATIVE Final    Comment:        The GeneXpert MRSA Assay (FDA approved for NASAL specimens only), is one component of a comprehensive MRSA colonization surveillance program. It is not intended to diagnose MRSA infection nor to guide or monitor treatment for MRSA infections. Performed at [facility], [place] [address], [place], [place] [address]      Labs: BNP (last 3 results) Recent Labs    01/01/18 1838 02/27/18 1237 05/02/18 2221  BNP 25.8 15.6 85.6   Basic Metabolic Panel: Recent Labs  Lab 05/02/18 2221 05/04/18 0951  NA 132* 135  K 4.2 4.4  CL 101 109  CO2 22 18*  GLUCOSE 125* 94  BUN 12 10  CREATININE 0.78 0.62  CALCIUM 9.4 8.2*   Liver Function Tests: Recent Labs  Lab 05/02/18 2221 05/04/18 0951  AST 43* 37  ALT 22 18  ALKPHOS 111 79  BILITOT 1.6* 0.5  PROT 7.9 6.2*  ALBUMIN 3.5 2.6*   Recent Labs  Lab 05/02/18 2221  LIPASE 31   No results for input(s): AMMONIA in the last 168 hours. CBC: Recent Labs  Lab 05/02/18 2221 05/05/18 0435  WBC 3.6*  --   NEUTROABS 2.4  --   HGB 4.5* 3.5*  HCT 15.9* 13.0*  MCV 97.0  --   PLT 142*  --    Cardiac Enzymes: Recent Labs  Lab 05/02/18 2221  TROPONINI <0.03   BNP: Invalid input(s): POCBNP CBG: No results for input(s): GLUCAP in the last 168 hours. D-Dimer No results for input(s): DDIMER in the last 72 hours. Hgb A1c No results for input(s): HGBA1C in the last 72 hours. Lipid Profile No results for input(s): CHOL, HDL, LDLCALC, TRIG, CHOLHDL, LDLDIRECT in the last 72 hours. Thyroid function studies No results for input(s): TSH, T4TOTAL, T3FREE, THYROIDAB in the last 72 hours.  Invalid input(s): FREET3 Anemia work up No results for input(s): VITAMINB12, FOLATE, FERRITIN, TIBC, IRON, RETICCTPCT in the last 72 hours. Urinalysis    Component Value  Date/Time   COLORURINE YELLOW 05/03/2018 [place] 05/03/2018 0044   LABSPEC  1.014 05/03/2018 0044   LABSPEC 1.025 01/11/2016 1233   PHURINE 6.0 05/03/2018 0044   GLUCOSEU NEGATIVE 05/03/2018 0044   GLUCOSEU Negative 01/11/2016 [place] 05/03/2018 0044   BILIRUBINUR NEGATIVE 05/03/2018 0044   BILIRUBINUR small 12/03/2016 1117   BILIRUBINUR Negative 01/11/2016 [place] 05/03/2018 0044   PROTEINUR NEGATIVE 05/03/2018 0044   UROBILINOGEN 1.0 12/03/2016 1117   UROBILINOGEN 1.0 09/13/2016 1306   UROBILINOGEN 0.2 01/11/2016 1233   NITRITE NEGATIVE 05/03/2018 0044   LEUKOCYTESUR NEGATIVE 05/03/2018 0044   LEUKOCYTESUR Small 01/11/2016 1233   Sepsis Labs Invalid input(s): PROCALCITONIN,  WBC,  LACTICIDVEN Microbiology Recent Results (from the past 240 hour(s))  Respiratory Panel by PCR     Status: None   Collection Time: 04/25/18  6:23 PM  Result Value Ref Range Status   Adenovirus NOT DETECTED NOT DETECTED Final   Coronavirus 229E NOT DETECTED NOT DETECTED Final   Coronavirus HKU1 NOT DETECTED NOT DETECTED Final   Coronavirus NL63 NOT DETECTED NOT DETECTED Final   Coronavirus OC43 NOT DETECTED NOT DETECTED Final   Metapneumovirus NOT DETECTED NOT DETECTED Final   Rhinovirus / Enterovirus NOT DETECTED NOT DETECTED Final   Influenza A NOT DETECTED NOT DETECTED Final   Influenza B NOT DETECTED NOT DETECTED Final   Parainfluenza Virus 1 NOT DETECTED NOT DETECTED Final   Parainfluenza Virus 2 NOT DETECTED NOT DETECTED Final   Parainfluenza Virus 3 NOT DETECTED NOT DETECTED Final   Parainfluenza Virus 4 NOT DETECTED NOT DETECTED Final   Respiratory Syncytial Virus NOT DETECTED NOT DETECTED Final   Bordetella pertussis NOT DETECTED NOT DETECTED Final   Chlamydophila pneumoniae NOT DETECTED NOT DETECTED Final   Mycoplasma pneumoniae NOT DETECTED NOT DETECTED Final    Comment: Performed at [facility], [place] [address], [place],  [place] [address]  Blood Culture (routine x 2)     Status: None (Preliminary result)   Collection Time: 05/02/18 10:21 PM  Result Value Ref Range Status   Specimen Description   Final    BLOOD RIGHT ANTECUBITAL Performed at [facility] [address], [place], [place] [address]    Special Requests   Final    BOTTLES DRAWN AEROBIC AND ANAEROBIC Blood Culture adequate volume Performed at [facility] [address] [place], [place] [address]    Culture   Final    NO GROWTH 1 DAY Performed at [facility], [place] [address], [place], [place] [address]    Report Status PENDING  Incomplete  Urine culture     Status: None   Collection Time: 05/03/18 12:44 AM  Result Value Ref Range Status   Specimen Description URINE, RANDOM  Final   Special Requests   Final    Immunocompromised Performed at [facility] [address], [place], [place] [address]    Culture NO GROWTH  Final   Report Status 05/04/2018 FINAL  Final  MRSA PCR Screening     Status: None   Collection Time: 05/03/18  9:57 AM  Result Value Ref Range Status   MRSA by PCR NEGATIVE NEGATIVE Final    Comment:        The GeneXpert MRSA Assay (FDA approved for NASAL specimens only), is one component of a comprehensive MRSA colonization surveillance program. It is not intended to diagnose MRSA infection nor to guide or monitor treatment for MRSA infections. Performed at [facility], [place] [address] [place],  [place] [address]      Time coordinating discharge: 31 minutes  SIGNED:   Hosie Poisson, MD  [facility] 05/05/2018, 9:05 AM Pager   If 7PM-7AM, please contact night-coverage [email or web address] Password [email or web address]

## 2018-05-06 ENCOUNTER — Inpatient Hospital Stay (HOSPITAL_COMMUNITY): Payer: Medicare Other

## 2018-05-06 ENCOUNTER — Other Ambulatory Visit: Payer: Self-pay | Admitting: Hematology

## 2018-05-06 DIAGNOSIS — A403 Sepsis due to Streptococcus pneumoniae: Secondary | ICD-10-CM

## 2018-05-06 DIAGNOSIS — C50919 Malignant neoplasm of unspecified site of unspecified female breast: Secondary | ICD-10-CM

## 2018-05-06 DIAGNOSIS — J111 Influenza due to unidentified influenza virus with other respiratory manifestations: Secondary | ICD-10-CM

## 2018-05-06 DIAGNOSIS — R609 Edema, unspecified: Secondary | ICD-10-CM

## 2018-05-06 DIAGNOSIS — I82409 Acute embolism and thrombosis of unspecified deep veins of unspecified lower extremity: Secondary | ICD-10-CM

## 2018-05-06 LAB — CBC WITH DIFFERENTIAL/PLATELET
Abs Immature Granulocytes: 0.06 10*3/uL (ref 0.00–0.07)
Basophils Absolute: 0 10*3/uL (ref 0.0–0.1)
Basophils Relative: 0 %
Eosinophils Absolute: 0 10*3/uL (ref 0.0–0.5)
Eosinophils Relative: 2 %
HCT: 13.7 % — ABNORMAL LOW (ref 36.0–46.0)
Hemoglobin: 3.8 g/dL — CL (ref 12.0–15.0)
Immature Granulocytes: 2 %
LYMPHS PCT: 34 %
Lymphs Abs: 0.9 10*3/uL (ref 0.7–4.0)
MCH: 27.7 pg (ref 26.0–34.0)
MCHC: 27.7 g/dL — ABNORMAL LOW (ref 30.0–36.0)
MCV: 100 fL (ref 80.0–100.0)
Monocytes Absolute: 0.2 10*3/uL (ref 0.1–1.0)
Monocytes Relative: 7 %
Neutro Abs: 1.4 10*3/uL — ABNORMAL LOW (ref 1.7–7.7)
Neutrophils Relative %: 55 %
Platelets: 92 10*3/uL — ABNORMAL LOW (ref 150–400)
RBC: 1.37 MIL/uL — ABNORMAL LOW (ref 3.87–5.11)
RDW: 19.7 % — AB (ref 11.5–15.5)
WBC: 2.6 10*3/uL — ABNORMAL LOW (ref 4.0–10.5)
nRBC: 5.9 % — ABNORMAL HIGH (ref 0.0–0.2)

## 2018-05-06 LAB — COMPREHENSIVE METABOLIC PANEL
ALT: 16 U/L (ref 0–44)
AST: 31 U/L (ref 15–41)
Albumin: 3.2 g/dL — ABNORMAL LOW (ref 3.5–5.0)
Alkaline Phosphatase: 88 U/L (ref 38–126)
Anion gap: 8 (ref 5–15)
BUN: 10 mg/dL (ref 6–20)
CO2: 20 mmol/L — ABNORMAL LOW (ref 22–32)
Calcium: 8.3 mg/dL — ABNORMAL LOW (ref 8.9–10.3)
Chloride: 106 mmol/L (ref 98–111)
Creatinine, Ser: 0.56 mg/dL (ref 0.44–1.00)
GFR calc Af Amer: >60 mL/min (ref >60)
GFR calc non Af Amer: >60 mL/min (ref >60)
Glucose, Bld: 98 mg/dL (ref 70–99)
POTASSIUM: 4 mmol/L (ref 3.5–5.1)
Sodium: 134 mmol/L — ABNORMAL LOW (ref 135–145)
Total Bilirubin: 0.6 mg/dL (ref 0.3–1.2)
Total Protein: 7 g/dL (ref 6.5–8.1)

## 2018-05-06 LAB — TROPONIN I: Troponin I: <0.03 ng/mL (ref <0.03)

## 2018-05-06 MED ORDER — LACTULOSE 10 GM/15ML PO SOLN
10.0000 g | Freq: Two times a day (BID) | ORAL | Status: DC
  Administered 2018-05-07 – 2018-05-08 (×3): 10 g via ORAL
  Filled 2018-05-06 (×4): qty 30

## 2018-05-06 MED ORDER — HYDROCODONE-HOMATROPINE 5-1.5 MG/5ML PO SYRP: 5.0000 mL | ORAL_SOLUTION | ORAL | Status: DC | PRN

## 2018-05-06 MED ORDER — HEPARIN (PORCINE) 25000 UT/250ML-% IV SOLN
1400.0000 [IU]/h | INTRAVENOUS | Status: DC
  Administered 2018-05-06: 1400 [IU]/h via INTRAVENOUS
  Filled 2018-05-06: qty 250

## 2018-05-06 MED ORDER — SENNOSIDES-DOCUSATE SODIUM 8.6-50 MG PO TABS
1.0000 | ORAL_TABLET | Freq: Two times a day (BID) | ORAL | Status: DC
  Administered 2018-05-06 – 2018-05-08 (×5): 1 via ORAL
  Filled 2018-05-06 (×5): qty 1

## 2018-05-06 MED ORDER — BENZONATATE 100 MG PO CAPS
200.0000 mg | ORAL_CAPSULE | Freq: Three times a day (TID) | ORAL | Status: DC
  Administered 2018-05-06 – 2018-05-08 (×6): 200 mg via ORAL
  Filled 2018-05-06 (×7): qty 2

## 2018-05-06 MED ORDER — POLYETHYLENE GLYCOL 3350 17 G PO PACK
17.0000 g | PACK | Freq: Every day | ORAL | Status: DC
  Filled 2018-05-06 (×2): qty 1

## 2018-05-06 NOTE — Progress Notes (Addendum)
ANTICOAGULATION CONSULT NOTE - Initial Consult  Pharmacy Consult for IV heparin Indication: history of PE/DVT, new acute DVTs  Allergies  Allergen Reactions  . Lisinopril-Hydrochlorothiazide Itching  . Adhesive [Tape] Itching and Other (See Comments)    Redness  . Fentanyl Other (See Comments)    GI upset and drowsiness. Only to [facility]  . Gabapentin Other (See Comments)    Auditory hallucinations  . Hctz [Hydrochlorothiazide] Itching  . Latex Itching  . Lisinopril Itching  . Losartan Potassium Other (See Comments)    Makes her feel "bad"   . Other Other (See Comments)    Patient refuses blood for religious reasons (per her notes)  . Oxycodone     GI upset, drowsy  . Tums [Calcium Carbonate Antacid] Hives    FRUIT-FLAVORED ONES    Patient Measurements: Height: 5\' 7"  (170.2 cm) Weight: 234 lb 14.4 oz (106.5 kg) IBW/kg (Calculated) : 61.6 Heparin Dosing Weight: 85.9 kg  Vital Signs: Temp: 97.8 F (36.6 C) (02/11 1338) Temp Source: Oral (02/11 1338) BP: 117/67 (02/11 1338) Pulse Rate: 66 (02/11 1338)  Labs: Recent Labs    05/04/18 0951 05/05/18 0435 05/05/18 1004 05/06/18 1241  HGB  --  3.5*  --  3.8*  HCT  --  13.0*  --  13.7*  PLT  --   --   --  92*  CREATININE 0.62  --   --  0.56  TROPONINI  --   --  <0.03  --     Estimated Creatinine Clearance: 94 mL/min (by C-G formula based on SCr of 0.56 mg/dL).   Medical History: Past Medical History:  Diagnosis Date  . Anxiety   . Arthritis    hands, knees, hips  . Asthma   . Bronchitis   . Cancer ([place])   . Cervical stenosis (uterine cervix)   . Disorder of appendix    Enlarged  . Dyspnea on exertion   . GERD (gastroesophageal reflux disease)   . Headache(784.0)   . History of breast cancer    2010--  LEFT  s/p  mastectomy (in [place]) AND CHEMORADIATION--  NO RECURRENCE  . History of cervical dysplasia   . Hyperlipidemia   . Hypertension   . Malignant neoplasm of overlapping sites of left breast  in female, estrogen receptor positive ([place]) 03/05/2013  . OSA (obstructive sleep apnea) moderate osa per study  09/2010   CPAP  , NOT USING ON REGULAR BASIS  . Pelvic pain in female   . Positive H. pylori test    08-05-2013  . Positive TB test    AS TEEN--  [place] W/ MEDS  . Refusal of blood transfusions as patient is Jehovah's Witness   . Seasonal allergies   . Uterine fibroid   . Wears glasses     Medications:  Enoxaparin 100mg  SQ q12h (last dose 05/05/2018 at 2337)  Assessment: 53 y/oF with PMH of metastatic breast cancer, pancytopenia, PE and DVT with IVC filter on therapeutic dose enoxaparin PTA admitted for sepsis secondary to influenza. PTA enoxaparin continued on admission. LE doppler today positive for acute DVTs. Pharmacy consulted to transition patient to IV heparin therapy.  Note, patient refused enoxaparin dose this AM due to hemoptysis (MD aware). Patient is Jehovah witness and refuses blood transfusion.   Today, 05/06/18:  CBC: Hgb 3.8 (baseline 4-5's), Pltc 92K (thrombocytopenia appears chronic but also likely secondary to consumption per MD)  SCr 0.56 with CrCl ~ 94 ml/min  Baseline PT/INR 15.7/1.26   Goal  of Therapy:  Heparin level 0.3-0.5 units/ml (target lower end of goal range per d/w Dr. Posey Pronto) Monitor platelets by anticoagulation protocol: Yes   Plan:   D/c Enoxaparin now   Start heparin infusion at 1400 units/hr (no initial bolus per Dr. Posey Pronto)  Faythe Ghee to do usual lab draws per heparin protocol per discussion with Dr. Posey Pronto (will need to use pediatric bottles though)  Check heparin level 6 hours after heparin infusion initiated  Daily CBC and heparin level  Monitor closely for s/sx of bleeding   Lindell Spar, PharmD, BCPS Pager: [phone] 05/06/2018 7:58 PM

## 2018-05-06 NOTE — Progress Notes (Signed)
Patient c/o chest pain. RN and Tech got 12 Lead EKG. PCP was notified. New orders for Troponins.

## 2018-05-06 NOTE — Care Management Important Message (Signed)
Important Message  Patient Details  Name: DEVYNN HESSLER MRN: [id number] Date of Birth: 05-25-1960   Medicare Important Message Given:  Yes    Kerin Salen 05/06/2018, 9:51 [place] Message  Patient Details  Name: DORRIE COCUZZA MRN: [id number] Date of Birth: 1961/03/16   Medicare Important Message Given:  Yes    Kerin Salen 05/06/2018, 9:51 AM

## 2018-05-06 NOTE — Progress Notes (Addendum)
Patient having blood tinged sputum with productive cough.  Worried about low blood counts and refuses lovenox this morning.  MD made aware.  Also refusing lactulose, miralax, norvasc, and tussionex.  States she doesn't like the way they make her feel and believe they may be contributing to her dizziness

## 2018-05-06 NOTE — Progress Notes (Signed)
Michaela Little   DOB:02/10/1961   NT#:[id number]   GQQ#:[id number]  Oncology follow up note   Subjective: Patient is well-known to me, under my care for her metastatic breast cancer.  She unfortunately has been hospitalized frequently, and presented to ED again with fever and productive cough on 2/8. She is afebrile now, stable, fees better overall.     Objective:  Vitals:   05/06/18 2017 05/06/18 2100  BP: 133/68 128/67  Pulse: 75 76  Resp: 18 18  Temp: 98.7 F (37.1 C) 98.2 F (36.8 C)  SpO2: 100% 100%    Body mass index is 36.79 kg/m.  Intake/Output Summary (Last 24 hours) at 05/06/2018 2248 Last data filed at 05/06/2018 1337 Gross per 24 hour  Intake 240 ml  Output 600 ml  Net -360 ml     Sclerae unicteric  Oropharynx clear  No peripheral adenopathy  Lungs clear -- no rales or rhonchi  Heart regular rate and rhythm  Abdomen soft  MSK no focal spinal tenderness, no peripheral edema  Neuro nonfocal    CBG (last 3)  No results for input(s): GLUCAP in the last 72 hours.   Labs:  Lab Results  Component Value Date   WBC 2.6 (L) 05/06/2018   HGB 3.8 (LL) 05/06/2018   HCT 13.7 (L) 05/06/2018   MCV 100.0 05/06/2018   PLT 92 (L) 05/06/2018   NEUTROABS 1.4 (L) 05/06/2018    CMP Latest Ref Rng & Units 05/06/2018 05/04/2018 05/02/2018  Glucose 70 - 99 mg/dL 98 94 125(H)  BUN 6 - 20 mg/dL 10 10 12   Creatinine 0.44 - 1.00 mg/dL 0.56 0.62 0.78  Sodium 135 - 145 mmol/L 134(L) 135 132(L)  Potassium 3.5 - 5.1 mmol/L 4.0 4.4 4.2  Chloride 98 - 111 mmol/L 106 109 101  CO2 22 - 32 mmol/L 20(L) 18(L) 22  Calcium 8.9 - 10.3 mg/dL 8.3(L) 8.2(L) 9.4  Total Protein 6.5 - 8.1 g/dL 7.0 6.2(L) 7.9  Total Bilirubin 0.3 - 1.2 mg/dL 0.6 0.5 1.6(H)  Alkaline Phos 38 - 126 U/L 88 79 111  AST 15 - 41 U/L 31 37 43(H)  ALT 0 - 44 U/L 16 18 22      Urine Studies No results for input(s): UHGB, CRYS in the last 72 hours.  Invalid input(s): UACOL, UAPR, USPG, UPH, UTP, UGL, UKET, UBIL, UNIT,  UROB, ULEU, UEPI, UWBC, URBC, [place], [place]  Basic Metabolic Panel: Recent Labs  Lab 05/02/18 2221 05/04/18 0951 05/06/18 1241  NA 132* 135 134*  K 4.2 4.4 4.0  CL 101 109 106  CO2 22 18* 20*  GLUCOSE 125* 94 98  BUN 12 10 10   CREATININE 0.78 0.62 0.56  CALCIUM 9.4 8.2* 8.3*   GFR Estimated Creatinine Clearance: 94 mL/min (by C-G formula based on SCr of 0.56 mg/dL). Liver Function Tests: Recent Labs  Lab 05/02/18 2221 05/04/18 0951 05/06/18 1241  AST 43* 37 31  ALT 22 18 16   ALKPHOS 111 79 88  BILITOT 1.6* 0.5 0.6  PROT 7.9 6.2* 7.0  ALBUMIN 3.5 2.6* 3.2*   Recent Labs  Lab 05/02/18 2221  LIPASE 31   No results for input(s): AMMONIA in the last 168 hours. Coagulation profile Recent Labs  Lab 05/02/18 2221  INR 1.26    CBC: Recent Labs  Lab 05/02/18 2221 05/05/18 0435 05/06/18 1241  WBC 3.6*  --  2.6*  NEUTROABS 2.4  --  1.4*  HGB 4.5* 3.5* 3.8*  HCT 15.9* 13.0* 13.7*  MCV 97.0  --  100.0  PLT 142*  --  92*   Cardiac Enzymes: Recent Labs  Lab 05/02/18 2221 05/05/18 1004  TROPONINI <0.03 <0.03   BNP: Invalid input(s): POCBNP CBG: No results for input(s): GLUCAP in the last 168 hours. D-Dimer No results for input(s): DDIMER in the last 72 hours. Hgb A1c No results for input(s): HGBA1C in the last 72 hours. Lipid Profile No results for input(s): CHOL, HDL, LDLCALC, TRIG, CHOLHDL, LDLDIRECT in the last 72 hours. Thyroid function studies No results for input(s): TSH, T4TOTAL, T3FREE, THYROIDAB in the last 72 hours.  Invalid input(s): FREET3 Anemia work up No results for input(s): VITAMINB12, FOLATE, FERRITIN, TIBC, IRON, RETICCTPCT in the last 72 hours. Microbiology Recent Results (from the past 240 hour(s))  Blood Culture (routine x 2)     Status: None (Preliminary result)   Collection Time: 05/02/18 10:21 PM  Result Value Ref Range Status   Specimen Description   Final    BLOOD RIGHT ANTECUBITAL Performed at [facility] [address], [place], [place] [address]    Special Requests   Final    BOTTLES DRAWN AEROBIC AND ANAEROBIC Blood Culture adequate volume Performed at [facility] [address], [place], [place] [address]    Culture   Final    NO GROWTH 3 DAYS Performed at [facility], [place] [address], [place], [place] [address]    Report Status PENDING  Incomplete  Urine culture     Status: None   Collection Time: 05/03/18 12:44 AM  Result Value Ref Range Status   Specimen Description URINE, RANDOM  Final   Special Requests   Final    Immunocompromised Performed at [facility] [address], [place], [place] [address]    Culture NO GROWTH  Final   Report Status 05/04/2018 FINAL  Final  MRSA PCR Screening     Status: None   Collection Time: 05/03/18  9:57 AM  Result Value Ref Range Status   MRSA by PCR NEGATIVE NEGATIVE Final    Comment:        The GeneXpert MRSA Assay (FDA approved for NASAL specimens only), is one component of a comprehensive MRSA colonization surveillance program. It is not intended to diagnose MRSA infection nor to guide or monitor treatment for MRSA infections. Performed at [facility], [place] [address], [place], [place] [address]       Studies:  Dg Chest 2 View  Result Date: 05/06/2018 CLINICAL DATA:  Acute onset of dizziness, generalized weakness and shortness of breath. EXAM: CHEST - 2 VIEW COMPARISON:  05/04/2018 and earlier. FINDINGS: AP SEMI-ERECT and LATERAL images were obtained. Cardiac silhouette mildly enlarged, unchanged. Hilar and mediastinal contours otherwise unremarkable. Interstitial opacities throughout both lungs are unchanged since the examination 2 days ago. No new pulmonary parenchymal abnormalities. No visible pleural effusions. Degenerative changes involving the thoracic spine. IMPRESSION: Stable interstitial pulmonary edema versus interstitial pneumonia  throughout both lungs when compared to the examination 2 days ago. No new abnormalities. Electronically Signed   By: Evangeline Dakin M.D.   On: 05/06/2018 12:29   Vas [place] Lower Extremity Venous (dvt)  Result Date: 05/06/2018  Lower Venous Study Indications: Swelling.  Risk Factors: DVT H/O 05/02/2017 obesity. Performing Technologist: Toma Copier RVS  Examination Guidelines: A complete evaluation includes B-mode imaging, spectral Doppler, color Doppler, and power Doppler as needed of all accessible portions of each vessel. Bilateral testing is considered an  integral part of a complete examination. Limited examinations for reoccurring indications may be performed as noted.  Right Venous Findings: +---------+---------------+---------+-----------+----------+-------------------+          CompressibilityPhasicitySpontaneityPropertiesSummary             +---------+---------------+---------+-----------+----------+-------------------+ CFV      Partial                 Yes                  Acute DVT resolving +---------+---------------+---------+-----------+----------+-------------------+ SFJ      Partial                                      Acute DVT resolving +---------+---------------+---------+-----------+----------+-------------------+ FV Prox  Full           Yes      Yes                  DVT resolved        +---------+---------------+---------+-----------+----------+-------------------+ FV Mid   Full                                                             +---------+---------------+---------+-----------+----------+-------------------+ FV DistalFull           Yes      Yes                  DVT resolved        +---------+---------------+---------+-----------+----------+-------------------+ PFV      Full           Yes      Yes                  DVT resolved        +---------+---------------+---------+-----------+----------+-------------------+ POP      Full            Yes      Yes                                      +---------+---------------+---------+-----------+----------+-------------------+ PTV                                                   Unable to visualize                                                       well eough for                                                            optimal evaluation. +---------+---------------+---------+-----------+----------+-------------------+ PERO  Unable to visualize                                                       well enough for                                                           optimal evaluation  +---------+---------------+---------+-----------+----------+-------------------+ GSV      Partial                 Yes                  Acute SVT resolving +---------+---------------+---------+-----------+----------+-------------------+  Right Technical Findings: Not visualized segments include peroneal and posterior tbial due to body habitus.  Left Venous Findings: +---------+---------------+---------+-----------+----------+-------------------+          CompressibilityPhasicitySpontaneityPropertiesSummary             +---------+---------------+---------+-----------+----------+-------------------+ CFV      Full                                                             +---------+---------------+---------+-----------+----------+-------------------+ SFJ      Full                                                             +---------+---------------+---------+-----------+----------+-------------------+ FV Prox  Full                                                             +---------+---------------+---------+-----------+----------+-------------------+ FV Mid   Full                                                              +---------+---------------+---------+-----------+----------+-------------------+ FV DistalFull                                                             +---------+---------------+---------+-----------+----------+-------------------+ PFV      Full                                                             +---------+---------------+---------+-----------+----------+-------------------+  POP      Full                                                             +---------+---------------+---------+-----------+----------+-------------------+ PTV                                                   Unable to fully                                                           visualize for                                                             optimal evaluation  +---------+---------------+---------+-----------+----------+-------------------+ PERO                                                  Unable to fully                                                           visualize well                                                            enough for optimal                                                        evaluation          +---------+---------------+---------+-----------+----------+-------------------+  Left Technical Findings: Not visualized segments include peroneal and posterior tibial due to body habitus.   Summary: Right: Findings consistent with acute deep vein thrombosis involving the right common femoral vein. Findings consistent with acute superficial vein thrombosis involving the right great saphenous vein. No cystic structure found in the popliteal fossa. Resolving DVT and SVT Left: There is no evidence of deep vein thrombosis in the lower extremity. No cystic structure found in the popliteal fossa.  *See table(s) above for measurements and observations. Electronically signed by Deitra Mayo MD on 05/06/2018 at 4:54:23 PM.     Final     Assessment: 58 y.o. AAF with metastatic  breast cancer to bone, liver and peritoneum, admitted for recurrent fever.  1.  Influenza infection  2.  Pancytopenia with severe anemia, Jehovah witness 3.  Metastatic breast cancer to bone, liver and peritoneum.  She was off treatment for several months due to severe anemia, recently started fulvestrant last week. 4. History of DVT and PE  5. Chronic cough  6. Chronic diastolic CHF  7. Diffuse body pain, including chest pain likely related to her bone metastasis    Plan:  -she is on Tamiflu, improving  -Patient had a CT renal stone study on admission, which showed significant progression in the liver compared to a month ago.  She has been gradually declining in the past several months, not a candidate for chemotherapy due to her severe anemia and Jehovah's Witness, her treatment options are very limited, and she is reluctant to take cancer treatment due to concern of worsening anemia and other side effects. -I previously discussed the goal of therapy is palliative, and recommended hospice, she declined. -Again had a conversation about CODE STATUS today, and my recommendation of DNR/DNI. She still wants to be full code, although she is not sure how long she wants to be on life support if that happens.  -Due to her Jehovah witness, I will minimize her blood draw, no need to repeat CBC daily unless there is concerns of bleeding  -her blood showed acute right lower extremity DVT, not sure this is new from previous known right lower extremity DVT.  I will talk to radiology tomorrow  -I spoke with palliative care Dr. Nyoka Cowden today, appreciate their assistance.    [name], MD 05/06/2018  10:48 PM

## 2018-05-06 NOTE — Progress Notes (Signed)
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  No nausea no vomiting.  No blood in the stool.  No diarrhea reported.  No abdominal pain.  Chest pain is reported as continues sharp pain worsening with breathing and pressure.  Assessment/Plan: 1.  Sepsis secondary to influenza. Pneumonia ruled out. Patient was started on IV antibiotics along with Tamiflu. We will discontinue biotics for now. Continue to provide supportive measures for cough suppression. Patient reports some hemoptysis which is blood-streaked sputum without any clots or frank blood. Chest x-ray on 05/06/2018 does not show any acute worsening.  Monitor on telemetry.  2.  Pancytopenia with severe anemia patient is Jehovah witness and refuses blood transfusion.  Plan is to minimize blood draws and use pediatric tubes only Discussed with hematology on-call. It is okay to use patient's therapeutic anticoagulation as long as she does not have any bleeding and as long as her creatinine remained stable. Recommend not to recheck CBC unless there is a concern clinically. I requested palliative care consultation for goals of care discussion.  Last visit note from oncologist recommend DNR/DNI consideration. New acute thrombocytopenia is likely secondary to consumption.  Monitor.  3.  History of DVT. Acute common femoral vein DVT along with SVT. History of IVC filter placement in the past. Patient is on Lovenox for therapeutic anticoagulation. Despite which the patient has developed new DVTs in her  leg. Very limited option at present for treatment. We will transition to IV heparin. Recommend patient and family to consider DNR/DNI-comfort care. Patient still not willing to make any decision right now.  4.  Chronic diastolic CHF. We will need to initiate Lasix when stable. Monitor.  5.  History of metastatic cancer patient stated that she is a 10-year survivor she is followed up by Dr. Burr Medico oncologist Currently recent scan shows the patient's metastasis to bone as well as liver is worsening and oncology recommended DNR/DNI.  6.  Chest x-ray showed some opacities.  Less likely pneumonia.  Will monitor.  7.  Chest pain. Pleuritic in nature. EKG unremarkable. Troponin x3 are also negative. No further work-up currently recommended.  Code Status: Full Had an extensive discussion with patient regarding her goals of care. She would like to maintain full code and would like to go under resuscitation in the event of cardiac arrest. Her chances of survival with her hemoglobin of 3.9 in the event of cardiac arrest are significantly low and even if she survives the neurological recovery chances are also very limited. On top of that the patient already has a terminal illness with progressive cancer for which no therapy has been available right now. Patient was informed that there is a high chances of having a significant event during that hospitalization secondary to her acute DVT diagnosis as well as thrombocytopenia and anemia with hemoptysis.  Continue to monitor for now.  Low threshold to transfer to stepdown unit.  Family Communication: Daughter at bedside discussed with son Dominica Severin on phone after patient permission.  Disposition Plan: Home when stable   Consultants:  None  Procedures:  None  Antimicrobials:  Cefepime  Vancomycin  DVT prophylaxis: Lovenox   Objective: Vitals:   05/05/18 1336 05/05/18 2101 05/06/18 0601 05/06/18 1338  BP: (!) 116/53 118/68 121/61 117/67   Pulse: 82 68 78 66  Resp: (!) 22 20 18 18   Temp: 98.8 F (37.1 C) 98.2 F (36.8 C) 98.2 F (36.8 C) 97.8 F (36.6 C)  TempSrc: Oral Oral Oral Oral  SpO2: 100% 100% 99% 100%  Weight:      Height:        Intake/Output Summary (Last 24 hours) at 05/06/2018 1914 Last data filed at 05/06/2018 1337 Gross per 24 hour  Intake 240 ml  Output 800 ml  Net -560 ml   Filed Weights   05/02/18 2152  Weight: 106.5 kg   Body mass index is 36.79 kg/m.  Exam:  . General: 58 y.o. year-old female well developed well nourished in no acute distress.  Alert and oriented x3. . Cardiovascular: Regular rate and rhythm with no rubs or gallops.  No thyromegaly or JVD noted.   Marland Kitchen Respiratory: Clear to auscultation with no wheezes or rales. Good inspiratory effort. . Abdomen: Soft nontender nondistended with normal bowel sounds x4 quadrants. . Musculoskeletal: trace lower extremity edema. 2/4 pulses in all 4 extremities. . Skin: No ulcerative lesions noted or rashes, . Psychiatry: Mood is appropriate for condition and setting    Data Reviewed: CBC: Recent Labs  Lab 05/02/18 2221 05/05/18 0435 05/06/18 1241  WBC 3.6*  --  2.6*  NEUTROABS 2.4  --  1.4*  HGB 4.5* 3.5* 3.8*  HCT 15.9* 13.0* 13.7*  MCV 97.0  --  100.0  PLT 142*  --  92*   Basic Metabolic Panel: Recent Labs  Lab 05/02/18 2221 05/04/18 0951 05/06/18 1241  NA 132* 135 134*  K 4.2 4.4 4.0  CL 101 109 106  CO2 22 18* 20*  GLUCOSE 125* 94 98  BUN 12 10 10   CREATININE 0.78 0.62 0.56  CALCIUM 9.4 8.2* 8.3*   GFR: Estimated Creatinine Clearance: 94 mL/min (by C-G formula based on SCr of 0.56 mg/dL). Liver Function Tests: Recent Labs  Lab 05/02/18 2221 05/04/18 0951 05/06/18 1241  AST 43* 37 31  ALT 22 18 16   ALKPHOS 111 79 88  BILITOT 1.6* 0.5 0.6  PROT 7.9 6.2* 7.0  ALBUMIN 3.5 2.6* 3.2*   Recent Labs  Lab 05/02/18 2221  LIPASE 31   No results for input(s): AMMONIA in the last 168 hours. Coagulation  Profile: Recent Labs  Lab 05/02/18 2221  INR 1.26   Cardiac Enzymes: Recent Labs  Lab 05/02/18 2221 05/05/18 1004  TROPONINI <0.03 <0.03   BNP (last 3 results) No results for input(s): PROBNP in the last 8760 hours. HbA1C: No results for input(s): HGBA1C in the last 72 hours. CBG: No results for input(s): GLUCAP in the last 168 hours. Lipid Profile: No results for input(s): CHOL, HDL, LDLCALC, TRIG, CHOLHDL, LDLDIRECT in the last 72 hours. Thyroid Function Tests: No results for input(s): TSH, T4TOTAL, FREET4, T3FREE, THYROIDAB in the last 72 hours. Anemia Panel: No results for input(s): VITAMINB12, FOLATE, FERRITIN, TIBC, IRON, RETICCTPCT in the last 72 hours. Urine analysis:    Component Value Date/Time   COLORURINE YELLOW 05/03/2018 [place] 05/03/2018 0044   LABSPEC 1.014 05/03/2018 0044   LABSPEC 1.025 01/11/2016 1233   PHURINE 6.0 05/03/2018 [place] 05/03/2018 0044   GLUCOSEU Negative 01/11/2016 [place] 05/03/2018 0044   BILIRUBINUR NEGATIVE 05/03/2018 0044  BILIRUBINUR small 12/03/2016 1117   BILIRUBINUR Negative 01/11/2016 1233   [place] 05/03/2018 0044   PROTEINUR NEGATIVE 05/03/2018 0044   UROBILINOGEN 1.0 12/03/2016 1117   UROBILINOGEN 1.0 09/13/2016 1306   UROBILINOGEN 0.2 01/11/2016 1233   NITRITE NEGATIVE 05/03/2018 0044   LEUKOCYTESUR NEGATIVE 05/03/2018 0044   LEUKOCYTESUR Small 01/11/2016 1233   Sepsis Labs: @LABRCNTIP (procalcitonin:4,lacticidven:4)  ) Recent Results (from the past 240 hour(s))  Blood Culture (routine x 2)     Status: None (Preliminary result)   Collection Time: 05/02/18 10:21 PM  Result Value Ref Range Status   Specimen Description   Final    BLOOD RIGHT ANTECUBITAL Performed at [facility], [place] [address] [place], [place] [address]    Special Requests   Final    BOTTLES DRAWN AEROBIC AND ANAEROBIC Blood Culture adequate volume Performed at  [facility] [address], [place], [place] [address]    Culture   Final    NO GROWTH 3 DAYS Performed at [facility], [place] [address], [place], [place] [address]    Report Status PENDING  Incomplete  Urine culture     Status: None   Collection Time: 05/03/18 12:44 AM  Result Value Ref Range Status   Specimen Description URINE, RANDOM  Final   Special Requests   Final    Immunocompromised Performed at [facility] [address] [place], [place] [address]    Culture NO GROWTH  Final   Report Status 05/04/2018 FINAL  Final  MRSA PCR Screening     Status: None   Collection Time: 05/03/18  9:57 AM  Result Value Ref Range Status   MRSA by PCR NEGATIVE NEGATIVE Final    Comment:        The GeneXpert MRSA Assay (FDA approved for NASAL specimens only), is one component of a comprehensive MRSA colonization surveillance program. It is not intended to diagnose MRSA infection nor to guide or monitor treatment for MRSA infections. Performed at [facility], [place] [address] [place], [place] [address]       Studies: Dg Chest 2 View  Result Date: 05/06/2018 CLINICAL DATA:  Acute onset of dizziness, generalized weakness and shortness of breath. EXAM: CHEST - 2 VIEW COMPARISON:  05/04/2018 and earlier. FINDINGS: AP SEMI-ERECT and LATERAL images were obtained. Cardiac silhouette mildly enlarged, unchanged. Hilar and mediastinal contours otherwise unremarkable. Interstitial opacities throughout both lungs are unchanged since the examination 2 days ago. No new pulmonary parenchymal abnormalities. No visible pleural effusions. Degenerative changes involving the thoracic spine. IMPRESSION: Stable interstitial pulmonary edema versus interstitial pneumonia throughout both lungs when compared to the examination 2 days ago. No new abnormalities. Electronically Signed   By: Evangeline Dakin M.D.   On: 05/06/2018 12:29   Vas [place] Lower  Extremity Venous (dvt)  Result Date: 05/06/2018  Lower Venous Study Indications: Swelling.  Risk Factors: DVT H/O 05/02/2017 obesity. Performing Technologist: Toma Copier RVS  Examination Guidelines: A complete evaluation includes B-mode imaging, spectral Doppler, color Doppler, and power Doppler as needed of all accessible portions of each vessel. Bilateral testing is considered an integral part of a complete examination. Limited examinations for reoccurring indications may be performed as noted.  Right Venous Findings: +---------+---------------+---------+-----------+----------+-------------------+          CompressibilityPhasicitySpontaneityPropertiesSummary             +---------+---------------+---------+-----------+----------+-------------------+ CFV      Partial  Yes                  Acute DVT resolving +---------+---------------+---------+-----------+----------+-------------------+ SFJ      Partial                                      Acute DVT resolving +---------+---------------+---------+-----------+----------+-------------------+ FV Prox  Full           Yes      Yes                  DVT resolved        +---------+---------------+---------+-----------+----------+-------------------+ FV Mid   Full                                                             +---------+---------------+---------+-----------+----------+-------------------+ FV DistalFull           Yes      Yes                  DVT resolved        +---------+---------------+---------+-----------+----------+-------------------+ PFV      Full           Yes      Yes                  DVT resolved        +---------+---------------+---------+-----------+----------+-------------------+ POP      Full           Yes      Yes                                      +---------+---------------+---------+-----------+----------+-------------------+ PTV                                                    Unable to visualize                                                       well eough for                                                            optimal evaluation. +---------+---------------+---------+-----------+----------+-------------------+ PERO                                                  Unable to visualize  well enough for                                                           optimal evaluation  +---------+---------------+---------+-----------+----------+-------------------+ GSV      Partial                 Yes                  Acute SVT resolving +---------+---------------+---------+-----------+----------+-------------------+  Right Technical Findings: Not visualized segments include peroneal and posterior tbial due to body habitus.  Left Venous Findings: +---------+---------------+---------+-----------+----------+-------------------+          CompressibilityPhasicitySpontaneityPropertiesSummary             +---------+---------------+---------+-----------+----------+-------------------+ CFV      Full                                                             +---------+---------------+---------+-----------+----------+-------------------+ SFJ      Full                                                             +---------+---------------+---------+-----------+----------+-------------------+ FV Prox  Full                                                             +---------+---------------+---------+-----------+----------+-------------------+ FV Mid   Full                                                             +---------+---------------+---------+-----------+----------+-------------------+ FV DistalFull                                                              +---------+---------------+---------+-----------+----------+-------------------+ PFV      Full                                                             +---------+---------------+---------+-----------+----------+-------------------+ POP      Full                                                             +---------+---------------+---------+-----------+----------+-------------------+  PTV                                                   Unable to fully                                                           visualize for                                                             optimal evaluation  +---------+---------------+---------+-----------+----------+-------------------+ PERO                                                  Unable to fully                                                           visualize well                                                            enough for optimal                                                        evaluation          +---------+---------------+---------+-----------+----------+-------------------+  Left Technical Findings: Not visualized segments include peroneal and posterior tibial due to body habitus.   Summary: Right: Findings consistent with acute deep vein thrombosis involving the right common femoral vein. Findings consistent with acute superficial vein thrombosis involving the right great saphenous vein. No cystic structure found in the popliteal fossa. Resolving DVT and SVT Left: There is no evidence of deep vein thrombosis in the lower extremity. No cystic structure found in the popliteal fossa.  *See table(s) above for measurements and observations. Electronically signed by Deitra Mayo MD on 05/06/2018 at 4:54:23 PM.    Final     Scheduled Meds: . amLODipine  10 mg Oral Daily  . benzonatate  200 mg Oral TID  . chlorpheniramine-HYDROcodone  5 mL Oral Q12H  .  dextromethorphan-guaiFENesin  1 tablet Oral BID  . enoxaparin  100 mg Subcutaneous Q12H  . feeding supplement  1 Container Oral TID BM  . fluticasone  1 spray Each Nare Daily  . folic acid  1 mg Oral Daily  . lactulose  10  g Oral BID  . oseltamivir  75 mg Oral BID  . polyethylene glycol  17 g Oral Daily  . senna-docusate  1 tablet Oral BID  . cyanocobalamin  1,000 mcg Oral Daily    Continuous Infusions:    LOS: 3 days     Berle Mull, MD [facility]  To reach me or the doctor on call, go to: [email or web address]  05/06/2018, 7:14 PM

## 2018-05-06 NOTE — Progress Notes (Signed)
As per the patient and from RN to RN report. The MD was going to administer Lovenox injections for the DVT. However the orders are for a Heparin Infusion to be administered. The patient is not too happy about being on Heparin Infusion. Notified the Pharmacist and the PCP to see if MD gave them any information in regards to discontinuing the Lovenox and starting the Heparin Infusion.

## 2018-05-06 NOTE — Progress Notes (Signed)
Bilateral lower extremity venous duplex completed -preliminary results found in Chart review CV Proc. [facility], [place]  05/06/2018, 1:46 PM

## 2018-05-07 DIAGNOSIS — Z7189 Other specified counseling: Secondary | ICD-10-CM

## 2018-05-07 DIAGNOSIS — Z789 Other specified health status: Secondary | ICD-10-CM

## 2018-05-07 DIAGNOSIS — Z515 Encounter for palliative care: Secondary | ICD-10-CM

## 2018-05-07 LAB — CBC
HEMATOCRIT: 12.9 % — AB (ref 36.0–46.0)
Hemoglobin: 3.8 g/dL — CL (ref 12.0–15.0)
MCH: 27.9 pg (ref 26.0–34.0)
MCHC: 29.5 g/dL — ABNORMAL LOW (ref 30.0–36.0)
MCV: 94.9 fL (ref 80.0–100.0)
Platelets: 83 10*3/uL — ABNORMAL LOW (ref 150–400)
RBC: 1.36 MIL/uL — ABNORMAL LOW (ref 3.87–5.11)
RDW: 19.1 % — ABNORMAL HIGH (ref 11.5–15.5)
WBC: 2.7 10*3/uL — ABNORMAL LOW (ref 4.0–10.5)
nRBC: 5.3 % — ABNORMAL HIGH (ref 0.0–0.2)

## 2018-05-07 LAB — HEPARIN LEVEL (UNFRACTIONATED)
Heparin Unfractionated: 1.08 IU/mL — ABNORMAL HIGH (ref 0.30–0.70)
Heparin Unfractionated: 0.96 IU/mL — ABNORMAL HIGH (ref 0.30–0.70)

## 2018-05-07 MED ORDER — HEPARIN (PORCINE) 25000 UT/250ML-% IV SOLN
850.0000 [IU]/h | INTRAVENOUS | Status: AC
  Administered 2018-05-07: 850 [IU]/h via INTRAVENOUS

## 2018-05-07 MED ORDER — ENOXAPARIN SODIUM 100 MG/ML ~~LOC~~ SOLN
100.0000 mg | Freq: Two times a day (BID) | SUBCUTANEOUS | Status: DC
  Administered 2018-05-07 – 2018-05-08 (×2): 100 mg via SUBCUTANEOUS
  Filled 2018-05-07 (×2): qty 1

## 2018-05-07 MED ORDER — HEPARIN (PORCINE) 25000 UT/250ML-% IV SOLN
1150.0000 [IU]/h | INTRAVENOUS | Status: DC
  Administered 2018-05-07 (×2): 1150 [IU]/h via INTRAVENOUS
  Filled 2018-05-07: qty 250

## 2018-05-07 NOTE — Progress Notes (Signed)
[place] for IV heparin ==> Lovenox Indication: history of PE/DVT, new acute DVTs  Allergies  Allergen Reactions  . Lisinopril-Hydrochlorothiazide Itching  . Adhesive [Tape] Itching and Other (See Comments)    Redness  . Fentanyl Other (See Comments)    GI upset and drowsiness. Only to [facility]  . Gabapentin Other (See Comments)    Auditory hallucinations  . Hctz [Hydrochlorothiazide] Itching  . Latex Itching  . Lisinopril Itching  . Losartan Potassium Other (See Comments)    Makes her feel "bad"   . Other Other (See Comments)    Patient refuses blood for religious reasons (per her notes)  . Oxycodone     GI upset, drowsy  . Tums [Calcium Carbonate Antacid] Hives    FRUIT-FLAVORED ONES    Patient Measurements: Height: 5\' 7"  (170.2 cm) Weight: 234 lb 14.4 oz (106.5 kg) IBW/kg (Calculated) : 61.6 Heparin Dosing Weight: 86 kg  Vital Signs: Temp: 98 F (36.7 C) (02/12 1404) Temp Source: Oral (02/12 1404) BP: 155/77 (02/12 1404) Pulse Rate: 75 (02/12 1404)  Labs: Recent Labs    05/05/18 0435 05/05/18 1004 05/06/18 1241 05/06/18 2235 05/07/18 0401 05/07/18 1215  HGB 3.5*  --  3.8*  --  3.8*  --   HCT 13.0*  --  13.7*  --  12.9*  --   PLT  --   --  92*  --  83*  --   HEPARINUNFRC  --   --   --   --  1.08* 0.96*  CREATININE  --   --  0.56  --   --   --   TROPONINI  --  <0.03  --  <0.03  --   --     Estimated Creatinine Clearance: 94 mL/min (by C-G formula based on SCr of 0.56 mg/dL).   Medical History: Past Medical History:  Diagnosis Date  . Anxiety   . Arthritis    hands, knees, hips  . Asthma   . Bronchitis   . Cancer ([place])   . Cervical stenosis (uterine cervix)   . Disorder of appendix    Enlarged  . Dyspnea on exertion   . GERD (gastroesophageal reflux disease)   . Headache(784.0)   . History of breast cancer    2010--  LEFT  s/p  mastectomy (in [place]) AND CHEMORADIATION--  NO RECURRENCE  .  History of cervical dysplasia   . Hyperlipidemia   . Hypertension   . Malignant neoplasm of overlapping sites of left breast in female, estrogen receptor positive ([place]) 03/05/2013  . OSA (obstructive sleep apnea) moderate osa per study  09/2010   CPAP  , NOT USING ON REGULAR BASIS  . Pelvic pain in female   . Positive H. pylori test    08-05-2013  . Positive TB test    AS TEEN--  TX W/ MEDS  . Refusal of blood transfusions as patient is Jehovah's Witness   . Seasonal allergies   . Uterine fibroid   . Wears glasses     Medications:  Enoxaparin 100mg  SQ q12h previously inpatient (last dose 05/05/2018 at 2337)  Assessment: 47 y/oF with PMH of metastatic breast cancer, pancytopenia, PE and DVT with IVC filter on therapeutic dose enoxaparin PTA admitted for sepsis secondary to influenza. PTA enoxaparin continued on admission. LE doppler 2/11 positive for acute DVTs. Pharmacy consulted to transition patient to IV heparin therapy. Note, patient refused enoxaparin dose 2/11 AM. Patient is Jehovah witness and  refuses blood transfusion.   Today, 05/07/18:  CBC: Hgb 3.8, low but stable compared to yesterday (baseline 4-5's), Pltc 83K (thrombocytopenia appears chronic but also likely secondary to consumption per MD)  HL (0.96) remains elevated despite decreased heparin infusion rate. Heparin currently infusing at 1150 units/hr.  Confirmed lab drawn appropriately   No issues with heparin infusion. No overt signs/symptoms of bleeding per discussion with RN and patient. Pt reports small amounts of red in sputum - MD aware.  SCr 0.56 with CrCl ~ 94 ml/min  Baseline PT/INR 15.7/1.26  2nd shift update:  Per the recommendation of Dr Burr Medico, pharmacy received consult to d/c IV heparin and transition patient back to therapeutic Lovenox  MD notes to plan to recheck CBC only when there is a concern clinically  Goal of Therapy:  Full anticoagulation   Plan:   D/C IV heparin @ 21:00  Resume  Lovenox 100mg  sq q12h @ 22:00  MD notes plan to repeat CBC in the AM  Need for further dosage adjustment appears unlikely at present.    Will sign off at this time.  Please reconsult if a change in clinical status warrants re-evaluation of dosage.   Everette Rank, PharmD 05/07/18 6:15 PM

## 2018-05-07 NOTE — Plan of Care (Signed)
  Problem: Health Behavior/Discharge Planning: Goal: Ability to manage health-related needs will improve Outcome: Progressing   Problem: Elimination: Goal: Will not experience complications related to bowel motility Outcome: Progressing Goal: Will not experience complications related to urinary retention Outcome: Progressing

## 2018-05-07 NOTE — Progress Notes (Signed)
ANTICOAGULATION CONSULT NOTE - Follow Up Consult  Pharmacy Consult for Heparin Indication: history of PE/DVT, new acute DVTs  Allergies  Allergen Reactions  . Lisinopril-Hydrochlorothiazide Itching  . Adhesive [Tape] Itching and Other (See Comments)    Redness  . Fentanyl Other (See Comments)    GI upset and drowsiness. Only to [facility]  . Gabapentin Other (See Comments)    Auditory hallucinations  . Hctz [Hydrochlorothiazide] Itching  . Latex Itching  . Lisinopril Itching  . Losartan Potassium Other (See Comments)    Makes her feel "bad"   . Other Other (See Comments)    Patient refuses blood for religious reasons (per her notes)  . Oxycodone     GI upset, drowsy  . Tums [Calcium Carbonate Antacid] Hives    FRUIT-FLAVORED ONES    Patient Measurements: Height: 5\' 7"  (170.2 cm) Weight: 234 lb 14.4 oz (106.5 kg) IBW/kg (Calculated) : 61.6 Heparin Dosing Weight:   Vital Signs: Temp: 98.2 F (36.8 C) (02/11 2100) Temp Source: Oral (02/11 2100) BP: 128/67 (02/11 2100) Pulse Rate: 76 (02/11 2100)  Labs: Recent Labs    05/04/18 0951  05/05/18 0435 05/05/18 1004 05/06/18 1241 05/06/18 2235 05/07/18 0401  HGB  --    < > 3.5*  --  3.8*  --  3.8*  HCT  --   --  13.0*  --  13.7*  --  12.9*  PLT  --   --   --   --  92*  --  83*  HEPARINUNFRC  --   --   --   --   --   --  1.08*  CREATININE 0.62  --   --   --  0.56  --   --   TROPONINI  --   --   --  <0.03  --  <0.03  --    < > = values in this interval not displayed.    Estimated Creatinine Clearance: 94 mL/min (by C-G formula based on SCr of 0.56 mg/dL).   Medications:  Infusions:  . heparin      Assessment: Patient with high heparin level.  No heparin issues per RN.    Goal of Therapy:  Heparin level 0.3-0.7 units/ml Monitor platelets by anticoagulation protocol: Yes   Plan:  Turn heparin off until 0600 At 0600 resume heparin at 1150 units/hr Recheck level at [address], [place]  [place] 05/07/2018,4:57 AM

## 2018-05-07 NOTE — Progress Notes (Signed)
PT Cancellation Note  Patient Details Name: Michaela Little MRN: [id number] DOB: 07-Sep-1960   Cancelled Treatment:    Reason Eval/Treat Not Completed: Medical issues which prohibited therapy;Patient at procedure or test/unavailable - Pt with Hgb of 3.8, and transport at pt's room upon PT arrival. PT to check back as schedule allows.  Julien Girt, PT Acute Rehabilitation Services Pager [phone]  Office [phone]    [place] 05/07/2018, 11:37 AM

## 2018-05-07 NOTE — Progress Notes (Signed)
ANTICOAGULATION CONSULT NOTE - Initial Consult  Pharmacy Consult for IV heparin Indication: history of PE/DVT, new acute DVTs  Allergies  Allergen Reactions  . Lisinopril-Hydrochlorothiazide Itching  . Adhesive [Tape] Itching and Other (See Comments)    Redness  . Fentanyl Other (See Comments)    GI upset and drowsiness. Only to [facility]  . Gabapentin Other (See Comments)    Auditory hallucinations  . Hctz [Hydrochlorothiazide] Itching  . Latex Itching  . Lisinopril Itching  . Losartan Potassium Other (See Comments)    Makes her feel "bad"   . Other Other (See Comments)    Patient refuses blood for religious reasons (per her notes)  . Oxycodone     GI upset, drowsy  . Tums [Calcium Carbonate Antacid] Hives    FRUIT-FLAVORED ONES    Patient Measurements: Height: 5\' 7"  (170.2 cm) Weight: 234 lb 14.4 oz (106.5 kg) IBW/kg (Calculated) : 61.6 Heparin Dosing Weight: 86 kg  Vital Signs: Temp: 98.4 F (36.9 C) (02/12 0545) Temp Source: Oral (02/12 0545) BP: 127/74 (02/12 1216) Pulse Rate: 75 (02/12 1216)  Labs: Recent Labs    05/05/18 0435 05/05/18 1004 05/06/18 1241 05/06/18 2235 05/07/18 0401 05/07/18 1215  HGB 3.5*  --  3.8*  --  3.8*  --   HCT 13.0*  --  13.7*  --  12.9*  --   PLT  --   --  92*  --  83*  --   HEPARINUNFRC  --   --   --   --  1.08* 0.96*  CREATININE  --   --  0.56  --   --   --   TROPONINI  --  <0.03  --  <0.03  --   --     Estimated Creatinine Clearance: 94 mL/min (by C-G formula based on SCr of 0.56 mg/dL).   Medical History: Past Medical History:  Diagnosis Date  . Anxiety   . Arthritis    hands, knees, hips  . Asthma   . Bronchitis   . Cancer ([place])   . Cervical stenosis (uterine cervix)   . Disorder of appendix    Enlarged  . Dyspnea on exertion   . GERD (gastroesophageal reflux disease)   . Headache(784.0)   . History of breast cancer    2010--  LEFT  s/p  mastectomy (in [place]) AND CHEMORADIATION--  NO RECURRENCE  .  History of cervical dysplasia   . Hyperlipidemia   . Hypertension   . Malignant neoplasm of overlapping sites of left breast in female, estrogen receptor positive ([place]) 03/05/2013  . OSA (obstructive sleep apnea) moderate osa per study  09/2010   CPAP  , NOT USING ON REGULAR BASIS  . Pelvic pain in female   . Positive H. pylori test    08-05-2013  . Positive TB test    AS TEEN--  [place] W/ MEDS  . Refusal of blood transfusions as patient is Jehovah's Witness   . Seasonal allergies   . Uterine fibroid   . Wears glasses     Medications:  Enoxaparin 100mg  SQ q12h previously inpatient (last dose 05/05/2018 at 2337)  Assessment: 6 y/oF with PMH of metastatic breast cancer, pancytopenia, PE and DVT with IVC filter on therapeutic dose enoxaparin PTA admitted for sepsis secondary to influenza. PTA enoxaparin continued on admission. LE doppler 2/11 positive for acute DVTs. Pharmacy consulted to transition patient to IV heparin therapy. Note, patient refused enoxaparin dose 2/11 AM. Patient is Jehovah witness and refuses  blood transfusion.   Today, 05/07/18:  CBC: Hgb 3.8, low but stable compared to yesterday (baseline 4-5's), Pltc 83K (thrombocytopenia appears chronic but also likely secondary to consumption per MD)  HL (0.96) remains elevated despite decreased heparin infusion rate. Heparin currently infusing at 1150 units/hr.  Confirmed lab drawn appropriately   No issues with heparin infusion. No overt signs/symptoms of bleeding per discussion with RN and patient. Pt reports small amounts of red in sputum - MD aware.  SCr 0.56 with CrCl ~ 94 ml/min  Baseline PT/INR 15.7/1.26  Goal of Therapy:  Heparin level 0.3-0.5 units/ml (target lower end of goal range, no boluses per discussion with MD) Monitor platelets by anticoagulation protocol: Yes   Plan:   Hold heparin infusion for 1 hour  Decrease heparin infusion to 850 units/hr  Check heparin level 6 hours after heparin infusion  resumed  Lab draws should be done using pediatric tubes  Daily CBC and heparin level   Monitor closely for signs/symptoms of bleeding  Michaela Little, PharmD 05/07/18 1:29 PM

## 2018-05-07 NOTE — Consult Note (Signed)
Consultation Note Date: 05/07/2018   Patient Name: Michaela Little  DOB: August 29, 1960  MRN: [id number]  Age / Sex: 58 y.o., female  PCP: Ladell Pier, MD Referring Physician: Donne Hazel, MD  Reason for Consultation: Establishing goals of care  HPI/Patient Profile: 58 y.o. female  with past medical history of metastatic breast cancer, PE on Lovenox, pancytopenia, admitted on 05/02/2018 with UTI, cough and found to have influenza.  Palliative consulted for goals of care.   Clinical Assessment and Goals of Care: I met today with Michaela Little and her daughter.   I introduced palliative care as specialized medical care for people living with serious illness. It focuses on providing relief from the symptoms and stress of a serious illness. The goal is to improve quality of life for both the patient and the family.  She reports that her physicians have been doing a good job explaining things to her, and she is able to give account of her multiple problems, including anemia, thrombocytopenia, progressive cancer, DVT, and infection from flu.  She reports that her family and faith are the most important things to her.  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  She reports understanding that this limits her care with her anemia and thrombocytopenia.   We discussed clinical course over the past several months as well as wishes moving forward in regard to advanced directives.  Concepts specific to code status, surrogate decision making, and rehospitalization discussed.  We discussed difference between a aggressive medical intervention path and a palliative, comfort focused care path.  Concept of Hospice and Palliative Care were discussed.  Questions and concerns addressed.   PMT will continue to support holistically.   SUMMARY OF RECOMMENDATIONS   - Full code/Full scope - Michaela Little remains  invested in plan for aggressive interventions. - We discussed completion of HCPOA paperwork to name a surrogate on her behalf.  She reports that she has this paperwork at home but she has not completed.  We were joined by PMT chaplain for part of encounter, and Michaela Little know that spiritual care can help complete AD if desired.  Palliative Prophylaxis:   Frequent Pain Assessment  Additional Recommendations (Limitations, Scope, Preferences):  Full Scope Treatment  Psycho-social/Spiritual:   Desire for further Chaplaincy support:Visited today  Additional Recommendations: Education on Hospice  Prognosis:   Unable to determine  Discharge Planning: Home with Home Health      Primary Diagnoses: Present on Admission: . Sepsis ([place]) . Pulmonary embolism ([place]) . Patient is Jehovah's Witness . Pancytopenia ([place]) . OSA (obstructive sleep apnea) . Metastatic breast cancer ([place]) . Influenza   I have reviewed the medical record, interviewed the patient and family, and examined the patient. The following aspects are pertinent.  Past Medical History:  Diagnosis Date  . Anxiety   . Arthritis    hands, knees, hips  . Asthma   . Bronchitis   . Cancer ([place])   . Cervical stenosis (uterine cervix)   .  Disorder of appendix    Enlarged  . Dyspnea on exertion   . GERD (gastroesophageal reflux disease)   . Headache(784.0)   . History of breast cancer    2010--  LEFT  s/p  mastectomy (in [place]) AND CHEMORADIATION--  NO RECURRENCE  . History of cervical dysplasia   . Hyperlipidemia   . Hypertension   . Malignant neoplasm of overlapping sites of left breast in female, estrogen receptor positive ([place]) 03/05/2013  . OSA (obstructive sleep apnea) moderate osa per study  09/2010   CPAP  , NOT USING ON REGULAR BASIS  . Pelvic pain in female   . Positive H. pylori test    08-05-2013  . Positive TB test    AS TEEN--  [place] W/ MEDS  . Refusal of blood transfusions as patient is Jehovah's  Witness   . Seasonal allergies   . Uterine fibroid   . Wears glasses    Social History   Socioeconomic History  . Marital status: Single    Spouse name: Not on file  . Number of children: 5  . Years of education: Not on file  . Highest education level: Not on file  Occupational History  . Occupation: Disabled  Social Needs  . Financial resource strain: Not on file  . Food insecurity:    Worry: Not on file    Inability: Not on file  . Transportation needs:    Medical: Not on file    Non-medical: Not on file  Tobacco Use  . Smoking status: Former Smoker    Packs/day: 0.30    Years: 10.00    Pack years: 3.00    Types: Cigarettes    Last attempt to quit: 03/26/1988    Years since quitting: 30.1  . Smokeless tobacco: Never Used  Substance and Sexual Activity  . Alcohol use: No  . Drug use: No  . Sexual activity: Not Currently  Lifestyle  . Physical activity:    Days per week: Not on file    Minutes per session: Not on file  . Stress: Not on file  Relationships  . Social connections:    Talks on phone: Not on file    Gets together: Not on file    Attends religious service: Not on file    Active member of club or organization: Not on file    Attends meetings of clubs or organizations: Not on file    Relationship status: Not on file  Other Topics Concern  . Not on file  Social History Narrative  . Not on file   Family History  Problem Relation Age of Onset  . Breast cancer Mother   . Colon cancer Mother   . Hypotension Mother   . Asthma Mother   . Diabetes type II Mother   . Arthritis Mother   . Clotting disorder Mother   . Cancer Mother        breast/colon  . Mental illness Brother   . Heart disease Brother   . Cerebral palsy Daughter   . Emphysema Brother        never smoker  . Colon cancer Maternal Aunt   . Cancer Maternal Aunt        colon  . Colon cancer Maternal Uncle   . Cancer Maternal Uncle        colon  . Esophageal cancer Neg Hx   . Rectal  cancer Neg Hx   . Stomach cancer Neg Hx   . Thyroid disease  Neg Hx    Scheduled Meds: . amLODipine  10 mg Oral Daily  . benzonatate  200 mg Oral TID  . chlorpheniramine-HYDROcodone  5 mL Oral Q12H  . dextromethorphan-guaiFENesin  1 tablet Oral BID  . enoxaparin (LOVENOX) injection  100 mg Subcutaneous Q12H  . feeding supplement  1 Container Oral TID BM  . fluticasone  1 spray Each Nare Daily  . folic acid  1 mg Oral Daily  . lactulose  10 g Oral BID  . polyethylene glycol  17 g Oral Daily  . senna-docusate  1 tablet Oral BID  . cyanocobalamin  1,000 mcg Oral Daily   Continuous Infusions: PRN Meds:.acetaminophen **OR** acetaminophen, alum & mag hydroxide-simeth, HYDROcodone-homatropine, ipratropium-albuterol, ondansetron **OR** ondansetron (ZOFRAN) IV, polyvinyl alcohol, traMADol Medications Prior to Admission:  Prior to Admission medications   Medication Sig Start Date End Date Taking? Authorizing Provider  acetaminophen (TYLENOL) 500 MG tablet Take 500 mg by mouth every 6 (six) hours as needed for mild pain.   Yes [provider]  amLODipine (NORVASC) 10 MG tablet Take 1 tablet (10 mg total) by mouth daily. 04/22/18  Yes Kayleen Memos, DO  cetirizine (ZYRTEC) 10 MG tablet Take 10 mg by mouth daily. 12/19/17  Yes [provider]  docusate sodium (COLACE) 100 MG capsule Take 1 capsule (100 mg total) by mouth daily as needed for mild constipation. 04/28/18  Yes Hosie Poisson, MD  enoxaparin (LOVENOX) 100 MG/ML injection Inject 1 mL (100 mg total) into the skin every 12 (twelve) hours. 05/01/18  Yes [name], MD  feeding supplement, ENSURE ENLIVE, (ENSURE ENLIVE) LIQD Take 237 mLs by mouth 2 (two) times daily between meals. Patient taking differently: Take 237 mLs by mouth 2 (two) times daily between meals. Non-dairy Ensure 04/28/18  Yes Hosie Poisson, MD  fluticasone (FLONASE) 50 MCG/ACT nasal spray Place 1 spray into both nostrils daily. 04/29/18  Yes Hosie Poisson, MD    fluticasone furoate-vilanterol (BREO ELLIPTA) 100-25 MCG/INH AEPB Inhale 1 puff into the lungs daily. Patient taking differently: Inhale 1 puff into the lungs daily as needed (sob and wheezing).  10/18/17  Yes Martyn Ehrich, NP  folic acid (FOLVITE) 1 MG tablet Take 1 tablet (1 mg total) by mouth daily. 01/05/18  Yes Vann, Jessica U, DO  furosemide (LASIX) 20 MG tablet Take 20 mg by mouth daily as needed for fluid. 04/29/18  Yes [provider]  hydrALAZINE (APRESOLINE) 10 MG tablet Take 1 tablet (10 mg total) by mouth 3 (three) times daily. 04/22/18  Yes Hall, Carole N, DO  ipratropium-albuterol (DUONEB) 0.5-2.5 (3) MG/3ML SOLN INHALE THREE MLS VIA NEBULIZER EVERY 6 HOURS AS NEEDED Patient taking differently: Take 3 mLs by nebulization every 6 (six) hours as needed (for wheezing or shortness of breath).  02/13/18  Yes Clent Demark, PA-C  Multiple Vitamins-Minerals (MULTIVITAMIN ADULT PO) Take 1 tablet by mouth daily.   Yes [provider]  polyethylene glycol (MIRALAX / GLYCOLAX) packet Take 17 g by mouth daily. 04/22/18  Yes Kayleen Memos, DO  traMADol (ULTRAM) 50 MG tablet Take 2 tablets (100 mg total) by mouth every 6 (six) hours. Patient taking differently: Take 100 mg by mouth every 6 (six) hours as needed for moderate pain or severe pain.  03/08/18  Yes Mikhail, [place], DO  vitamin B-12 1000 MCG tablet Take 1 tablet (1,000 mcg total) by mouth daily. 01/05/18  Yes Vann, Jessica U, DO  enoxaparin (LOVENOX) 120 MG/0.8ML injection Inject 0.73 mLs (110  mg total) into the skin every 12 (twelve) hours. 05/06/18 06/05/18  [name], MD  exemestane (AROMASIN) 25 MG tablet Take 1 tablet (25 mg total) by mouth daily after breakfast. 05/06/18   [name], MD  lactulose (CHRONULAC) 10 GM/15ML solution Take 15-30 mLs (10-20 g total) by mouth 3 (three) times daily. 05/01/18   [name], MD  levofloxacin (LEVAQUIN) 500 MG tablet Take 1 tablet (500 mg total) by mouth daily. 05/02/18   [name], MD   Allergies  Allergen Reactions  . Lisinopril-Hydrochlorothiazide Itching  . Adhesive [Tape] Itching and Other (See Comments)    Redness  . Fentanyl Other (See Comments)    GI upset and drowsiness. Only to [facility]  . Gabapentin Other (See Comments)    Auditory hallucinations  . Hctz [Hydrochlorothiazide] Itching  . Latex Itching  . Lisinopril Itching  . Losartan Potassium Other (See Comments)    Makes her feel "bad"   . Other Other (See Comments)    Patient refuses blood for religious reasons (per her notes)  . Oxycodone     GI upset, drowsy  . Tums [Calcium Carbonate Antacid] Hives    FRUIT-FLAVORED ONES   Review of Systems  Constitutional: Positive for activity change, appetite change and fatigue.  Neurological: Positive for weakness.  Psychiatric/Behavioral: The patient is nervous/anxious.     Physical Exam General: Alert, awake, in no acute distress.  HEENT: No bruits, no goiter, no JVD Heart: Regular rate and rhythm. No murmur appreciated. Lungs: Good air movement, clear Abdomen: Soft, nontender, nondistended, positive bowel sounds.  Ext: No significant edema Skin: Warm and dry Neuro: Grossly intact, nonfocal.  Vital Signs: BP (!) 144/73   Pulse 75   Temp 98.2 F (36.8 C) (Oral)   Resp 18   Ht _0  (1.702 m)   Wt 106.5 kg   LMP 02/25/2009   SpO2 100%   BMI 36.79 kg/m  Pain Scale: 0-10   Pain Score: 0-No pain   SpO2: SpO2: 100 % O2 Device:SpO2: 100 % O2 Flow Rate: .O2 Flow Rate (L/min): 1 L/min  IO: Intake/output summary:   Intake/Output Summary (Last 24 hours) at 05/07/2018 2333 Last data filed at 05/07/2018 1600 Gross per 24 hour  Intake 369.89 ml  Output 1050 ml  Net -680.11 ml    LBM: Last BM Date: 05/06/18 Baseline Weight: Weight: 106.5 kg Most recent weight: Weight: 106.5 kg     Palliative Assessment/Data:   Flowsheet Rows     Most Recent Value  Intake Tab  Referral Department  Hospitalist  Unit at Time of  Referral  Med/Surg Unit  Palliative Care Primary Diagnosis  Sepsis/Infectious Disease  Date Notified  05/05/18  Palliative Care Type  Return patient Palliative Care  Reason for referral  Clarify Goals of Care  Date of Admission  05/02/18  # of days IP prior to Palliative referral  3  Clinical Assessment  Psychosocial & Spiritual Assessment  Palliative Care Outcomes     Time Total: 60 minutes Greater than 50%  of this time was spent counseling and coordinating care related to the above assessment and plan.  Signed by: Micheline Rough, MD   Please contact Palliative Medicine Team phone at [phone] for questions and concerns.  For individual provider: See Shea Evans

## 2018-05-07 NOTE — Progress Notes (Signed)
   05/07/18 1551  Clinical Encounter Type  Visited With Patient and family together  Visit Type Initial  Referral From Palliative care team  Consult/Referral To Chaplain  The chaplain introduced herself to the Pt. and Pt. daughter-Shameka. The chaplain joined the MD-GF during his visit with the Pt. and family.  The MD provided the Pt. with information about an AD and how to utilize the service through the spiritual care office.  The Pt. has knowledge of an AD and will contact the chaplain if needed. The chaplain remained with the Pt. and family after the MD exited.  The chaplain was pastorally present and heard the Pt.'s peace in her cancer diagnosis comes from her faith and her family. The chaplain is available for F/U spiritual care if needed.

## 2018-05-07 NOTE — Progress Notes (Signed)
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  No nausea no vomiting.  No blood in the stool.  No diarrhea reported.  No abdominal pain.  Chest pain is reported as continues sharp pain worsening with breathing and pressure.  Assessment & Plan:   Principal Problem:   Sepsis ([place]) Active Problems:   OSA (obstructive sleep apnea)   Influenza   Pancytopenia ([place])   Patient is Jehovah's Witness   Pulmonary embolism ([place])   Metastatic breast cancer ([place])   Malnutrition of moderate degree  1.  Sepsis secondary to influenza. -Pneumonia was ruled out. -Patient was started on IV antibiotics along with Tamiflu. -Antibiotics were held -Continue with anti-tussive meds as needed -Noted to have intermittent streaks of blood per sputum -Chest x-ray on 05/06/2018 does not show any acute worsening.  Monitor on telemetry.  2.  Pancytopenia with severe anemia -patient is Jehovah witness and refuses blood transfusion.  Plan is to minimize blood draws and use pediatric tubes only -Pt is continued on therapeutic anticoagulation as long as she does not have any bleeding and as long as her creatinine remained stable. Recommend not to recheck CBC unless there is a concern clinically. -Palliative Care was consulted for goals of care discussion.  Last visit note from oncologist recommend DNR/DNI consideration. New acute thrombocytopenia suspected to be related to consumption. -Repeat CBC in AM  3.  History of DVT. -Acute common femoral vein DVT along with SVT. -History  of IVC filter placement in the past. -Patient previously Lovenox for therapeutic anticoagulation. -Pt now on heparin gtt. -will discuss risk/benefit to anticoagulation with Heme-Onc in light of anemia and if benefits outweighs risk, then will discuss with Heme-Onc regarding long-term anticoagulation of choice -Heme-Onc recommendation for DNR/DNI-comfort care.  4.  Chronic diastolic CHF. -Plan to resume lasix when pt able to tolerate  5.  History of metastatic cancer patient stated that she is a 10-year survivor she is followed up by Dr. Burr Medico oncologist -Recent imaging demonstrates the patient's metastasis to bone as well as liver is worsening and oncology recommended DNR/DNI.  6.  Chest x-ray showed some opacities.  -Less likely pneumoni.  Will monitor. -Stable at present  7.  Chest pain. -Noted to be pleuritic in nature. -Troponin serially neg  DVT prophylaxis: Heparin gtt Code Status: Full Family Communication: Pt in room, family at bedside Disposition Plan: Uncertain at this time  Consultants:   Oncology  Procedures:     Antimicrobials: Anti-infectives (From admission, onward)   Start     Dose/Rate Route Frequency Ordered Stop   05/05/18 1100  vancomycin (VANCOCIN) IVPB 750 mg/150 ml premix  Status:  Discontinued     750 mg 150 mL/hr over 60 Minutes Intravenous Every 12 hours 05/05/18 0730 05/05/18 0831   05/03/18 1100  vancomycin (VANCOCIN) 1,250 mg in sodium chloride 0.9 % 250 mL IVPB  Status:  Discontinued     1,250 mg 166.7 mL/hr over 90 Minutes Intravenous Every 12 hours 05/03/18 0308 05/05/18 0730   05/03/18 0800  ceFEPIme (MAXIPIME) 1 g in sodium chloride 0.9 % 100 mL IVPB  Status:  Discontinued     1 g 200 mL/hr over 30 Minutes Intravenous Every 8 hours 05/03/18 0308 05/05/18 0944   05/03/18 0215  oseltamivir (TAMIFLU) capsule 75 mg     75 mg Oral 2 times daily 05/03/18 0206 05/07/18 2159   05/02/18 2315  vancomycin (VANCOCIN) 2,000 mg in sodium chloride  0.9 % 500 mL IVPB     2,000 mg 250 mL/hr over 120 Minutes Intravenous  Once 05/02/18 2257 05/03/18 0225   05/02/18 2300  ceFEPIme (MAXIPIME) 2 g in sodium chloride 0.9 % 100 mL IVPB     2 g 200 mL/hr over 30 Minutes Intravenous NOW 05/02/18 2257 05/02/18 2350       Subjective: Noticing streaks of blood in sputum  Objective: Vitals:   05/06/18 2100 05/07/18 0545 05/07/18 1216 05/07/18 1404  BP: 128/67 (!) 125/59 127/74 (!) 155/77  Pulse: 76 74 75 75  Resp: 18 20  19   Temp: 98.2 F (36.8 C) 98.4 F (36.9 C)  98 F (36.7 C)  TempSrc: Oral Oral  Oral  SpO2: 100% 100%  100%  Weight:      Height:        Intake/Output Summary (Last 24 hours) at 05/07/2018 1408 Last data filed at 05/07/2018 1405 Gross per 24 hour  Intake 600 ml  Output 1300 ml  Net -700 ml   Filed Weights   05/02/18 2152  Weight: 106.5 kg    Examination:  General exam: Appears calm and comfortable  Respiratory system: Clear to auscultation. Respiratory effort normal. Cardiovascular system: S1 & S2 heard, RRR Gastrointestinal system: Abdomen is nondistended, soft and nontender. No organomegaly or masses felt. Normal bowel sounds heard. Central nervous system: Alert and oriented. No focal neurological deficits. Extremities: Symmetric 5 x 5 power. Skin: No rashes, lesions  Psychiatry: Judgement and insight appear normal. Mood & affect appropriate.   Data Reviewed: I have personally reviewed following labs and imaging studies  CBC: Recent Labs  Lab 05/02/18 2221 05/05/18 0435 05/06/18 1241 05/07/18 0401  WBC 3.6*  --  2.6* 2.7*  NEUTROABS 2.4  --  1.4*  --   HGB 4.5* 3.5* 3.8* 3.8*  HCT 15.9* 13.0* 13.7* 12.9*  MCV 97.0  --  100.0 94.9  PLT 142*  --  92* 83*   Basic Metabolic Panel: Recent Labs  Lab 05/02/18 2221 05/04/18 0951 05/06/18 1241  NA 132* 135 134*  K 4.2 4.4 4.0  CL 101 109 106  CO2 22 18* 20*  GLUCOSE 125* 94 98  BUN 12 10 10   CREATININE 0.78 0.62 0.56  CALCIUM 9.4 8.2*  8.3*   GFR: Estimated Creatinine Clearance: 94 mL/min (by C-G formula based on SCr of 0.56 mg/dL). Liver Function Tests: Recent Labs  Lab 05/02/18 2221 05/04/18 0951 05/06/18 1241  AST 43* 37 31  ALT 22 18 16   ALKPHOS 111 79 88  BILITOT 1.6* 0.5 0.6  PROT 7.9 6.2* 7.0  ALBUMIN 3.5 2.6* 3.2*   Recent Labs  Lab 05/02/18 2221  LIPASE 31   No results for input(s): AMMONIA in the last 168 hours. Coagulation Profile: Recent Labs  Lab 05/02/18 2221  INR 1.26   Cardiac Enzymes: Recent Labs  Lab 05/02/18 2221 05/05/18 1004 05/06/18 2235  TROPONINI <0.03 <0.03 <0.03   BNP (last 3 results) No results for input(s): PROBNP in the last 8760 hours. HbA1C: No results for input(s): HGBA1C in the last 72 hours. CBG: No results for input(s): GLUCAP in the last 168 hours. Lipid  Profile: No results for input(s): CHOL, HDL, LDLCALC, TRIG, CHOLHDL, LDLDIRECT in the last 72 hours. Thyroid Function Tests: No results for input(s): TSH, T4TOTAL, FREET4, T3FREE, THYROIDAB in the last 72 hours. Anemia Panel: No results for input(s): VITAMINB12, FOLATE, FERRITIN, TIBC, IRON, RETICCTPCT in the last 72 hours. Sepsis Labs: Recent Labs  Lab 05/02/18 2221 05/03/18 1019 05/03/18 1400  LATICACIDVEN 1.6 1.3 1.2    Recent Results (from the past 240 hour(s))  Blood Culture (routine x 2)     Status: None (Preliminary result)   Collection Time: 05/02/18 10:21 PM  Result Value Ref Range Status   Specimen Description   Final    BLOOD RIGHT ANTECUBITAL Performed at [facility] [address] [place], [place] [address]    Special Requests   Final    BOTTLES DRAWN AEROBIC AND ANAEROBIC Blood Culture adequate volume Performed at [facility] [address] [place], [place] [address]    Culture   Final    NO GROWTH 4 DAYS Performed at [facility], [place] [address] [place], [place] [address]    Report Status PENDING  Incomplete  Urine culture      Status: None   Collection Time: 05/03/18 12:44 AM  Result Value Ref Range Status   Specimen Description URINE, RANDOM  Final   Special Requests   Final    Immunocompromised Performed at [facility] [address], [place], [place] [address]    Culture NO GROWTH  Final   Report Status 05/04/2018 FINAL  Final  MRSA PCR Screening     Status: None   Collection Time: 05/03/18  9:57 AM  Result Value Ref Range Status   MRSA by PCR NEGATIVE NEGATIVE Final    Comment:        The GeneXpert MRSA Assay (FDA approved for NASAL specimens only), is one component of a comprehensive MRSA colonization surveillance program. It is not intended to diagnose MRSA infection nor to guide or monitor treatment for MRSA infections. Performed at [facility], [place] [address] [place],  [address]      Radiology Studies: Dg Chest 2 View  Result Date: 05/06/2018 CLINICAL DATA:  Acute onset of dizziness, generalized weakness and shortness of breath. EXAM: CHEST - 2 VIEW COMPARISON:  05/04/2018 and earlier. FINDINGS: AP SEMI-ERECT and LATERAL images were obtained. Cardiac silhouette mildly enlarged, unchanged. Hilar and mediastinal contours otherwise unremarkable. Interstitial opacities throughout both lungs are unchanged since the examination 2 days ago. No new pulmonary parenchymal abnormalities. No visible pleural effusions. Degenerative changes involving the thoracic spine. IMPRESSION: Stable interstitial pulmonary edema versus interstitial pneumonia throughout both lungs when compared to the examination 2 days ago. No new abnormalities. Electronically Signed   By: Evangeline Dakin M.D.   On: 05/06/2018 12:29   Vas [place] Lower Extremity Venous (dvt)  Result Date: 05/06/2018  Lower Venous Study Indications: Swelling.  Risk Factors: DVT H/O 05/02/2017 obesity. Performing Technologist: Toma Copier RVS  Examination Guidelines: A complete evaluation includes B-mode  imaging, spectral Doppler, color Doppler, and power Doppler as needed of all accessible portions of each vessel. Bilateral testing is considered an integral part of a complete examination. Limited examinations for reoccurring indications may be performed as noted.  Right Venous Findings: +---------+---------------+---------+-----------+----------+-------------------+          CompressibilityPhasicitySpontaneityPropertiesSummary             +---------+---------------+---------+-----------+----------+-------------------+ CFV      Partial  Yes                  Acute DVT resolving +---------+---------------+---------+-----------+----------+-------------------+ SFJ      Partial                                      Acute DVT resolving +---------+---------------+---------+-----------+----------+-------------------+ FV Prox  Full           Yes      Yes                  DVT resolved        +---------+---------------+---------+-----------+----------+-------------------+ FV Mid   Full                                                             +---------+---------------+---------+-----------+----------+-------------------+ FV DistalFull           Yes      Yes                  DVT resolved        +---------+---------------+---------+-----------+----------+-------------------+ PFV      Full           Yes      Yes                  DVT resolved        +---------+---------------+---------+-----------+----------+-------------------+ POP      Full           Yes      Yes                                      +---------+---------------+---------+-----------+----------+-------------------+ PTV                                                   Unable to visualize                                                       well eough for                                                            optimal evaluation.  +---------+---------------+---------+-----------+----------+-------------------+ PERO                                                  Unable to visualize  well enough for                                                           optimal evaluation  +---------+---------------+---------+-----------+----------+-------------------+ GSV      Partial                 Yes                  Acute SVT resolving +---------+---------------+---------+-----------+----------+-------------------+  Right Technical Findings: Not visualized segments include peroneal and posterior tbial due to body habitus.  Left Venous Findings: +---------+---------------+---------+-----------+----------+-------------------+          CompressibilityPhasicitySpontaneityPropertiesSummary             +---------+---------------+---------+-----------+----------+-------------------+ CFV      Full                                                             +---------+---------------+---------+-----------+----------+-------------------+ SFJ      Full                                                             +---------+---------------+---------+-----------+----------+-------------------+ FV Prox  Full                                                             +---------+---------------+---------+-----------+----------+-------------------+ FV Mid   Full                                                             +---------+---------------+---------+-----------+----------+-------------------+ FV DistalFull                                                             +---------+---------------+---------+-----------+----------+-------------------+ PFV      Full                                                             +---------+---------------+---------+-----------+----------+-------------------+ POP      Full                                                              +---------+---------------+---------+-----------+----------+-------------------+  PTV                                                   Unable to fully                                                           visualize for                                                             optimal evaluation  +---------+---------------+---------+-----------+----------+-------------------+ PERO                                                  Unable to fully                                                           visualize well                                                            enough for optimal                                                        evaluation          +---------+---------------+---------+-----------+----------+-------------------+  Left Technical Findings: Not visualized segments include peroneal and posterior tibial due to body habitus.   Summary: Right: Findings consistent with acute deep vein thrombosis involving the right common femoral vein. Findings consistent with acute superficial vein thrombosis involving the right great saphenous vein. No cystic structure found in the popliteal fossa. Resolving DVT and SVT Left: There is no evidence of deep vein thrombosis in the lower extremity. No cystic structure found in the popliteal fossa.  *See table(s) above for measurements and observations. Electronically signed by Deitra Mayo MD on 05/06/2018 at 4:54:23 PM.    Final     Scheduled Meds: . amLODipine  10 mg Oral Daily  . benzonatate  200 mg Oral TID  . chlorpheniramine-HYDROcodone  5 mL Oral Q12H  . dextromethorphan-guaiFENesin  1 tablet Oral BID  . feeding supplement  1 Container Oral TID BM  . fluticasone  1 spray Each Nare Daily  . folic acid  1 mg Oral Daily  . lactulose  10 g Oral BID  . oseltamivir  75 mg  Oral BID  . polyethylene glycol  17 g Oral Daily  . senna-docusate  1  tablet Oral BID  . cyanocobalamin  1,000 mcg Oral Daily   Continuous Infusions: . heparin       LOS: 4 days   Marylu Lund, MD [facility] Pager On Amion  If 7PM-7AM, please contact night-coverage 05/07/2018, 2:08 PM

## 2018-05-08 DIAGNOSIS — K5900 Constipation, unspecified: Secondary | ICD-10-CM

## 2018-05-08 LAB — CBC
HCT: 12.4 % — ABNORMAL LOW (ref 36.0–46.0)
Hemoglobin: 3.6 g/dL — CL (ref 12.0–15.0)
MCH: 27.5 pg (ref 26.0–34.0)
MCHC: 29 g/dL — ABNORMAL LOW (ref 30.0–36.0)
MCV: 94.7 fL (ref 80.0–100.0)
PLATELETS: 85 10*3/uL — AB (ref 150–400)
RBC: 1.31 MIL/uL — ABNORMAL LOW (ref 3.87–5.11)
RDW: 19.1 % — ABNORMAL HIGH (ref 11.5–15.5)
WBC: 2.9 10*3/uL — ABNORMAL LOW (ref 4.0–10.5)
nRBC: 2.4 % — ABNORMAL HIGH (ref 0.0–0.2)

## 2018-05-08 LAB — CULTURE, BLOOD (ROUTINE X 2)
Culture: NO GROWTH
SPECIAL REQUESTS: ADEQUATE

## 2018-05-08 NOTE — Evaluation (Signed)
Occupational Therapy Evaluation and defer to next venue of care - back to [facility] Patient Details Name: Michaela Little MRN: [id number] DOB: [birth year] Today's Date: 05/08/2018    History of Present Illness  58 y.o. 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 Pt currently admitted 05/03/18 for Sepsis secondary to influenza   Clinical Impression   Pt functioning at min guard level, which is consistent with her pre-hospitalization PLOF. She reports that the hardest thing for her at home is the ability to cook meals. Energy conservation education provided verbally and with handout. Pt also able to perform transfers with RW, toileting, peri care, and bed mobility at min guard/supervision level. OT to defer further OT back to [facility] level to see function in Pt's own environment.    Follow Up Recommendations  Home health OT    Equipment Recommendations  None recommended by OT(has appropriate DME at home)    Recommendations for Other Services       Precautions / Restrictions Precautions Precautions: Fall Precaution Comments: low HGB, no blood products; pt reports 2L O2 [place] baseline Restrictions Weight Bearing Restrictions: No      Mobility Bed Mobility Overal bed mobility: Needs Assistance Bed Mobility: Sit to Supine     Supine to sit: Supervision Sit to supine: Supervision   General bed mobility comments: pt utilized elevated HOB  Transfers Overall transfer level: Needs assistance Equipment used: Rolling walker (2 wheeled) Transfers: Sit to/from Stand Sit to Stand: Min guard         General transfer comment: min/guard for safety (lines, low hgb)    Balance Overall balance assessment: Needs assistance Sitting-balance support: Feet  supported;No upper extremity supported Sitting balance-Leahy Scale: Good     Standing balance support: No upper extremity supported Standing balance-Leahy Scale: Fair Standing balance comment: static balance fair however utilized RW for support with ambulation                           ADL either performed or assessed with clinical judgement   ADL Overall ADL's : Needs assistance/impaired Eating/Feeding: Independent   Grooming: Min guard;Standing Grooming Details (indicate cue type and reason): sink level, decreased stamina - has been like this since Dec Upper Body Bathing: Set up   Lower Body Bathing: Minimal assistance   Upper Body Dressing : Set up   Lower Body Dressing: Min guard   Toilet Transfer: Min guard;Ambulation;RW   Toileting- Clothing Manipulation and Hygiene: Modified independent   Tub/ Banker: Minimal assistance   Functional mobility during ADLs: Min guard General ADL Comments: Pt shared that the hardest thing for her at home is cooking. Provided energy conservation education as well as handout.     Vision Patient Visual Report: No change from baseline       Perception     Praxis      Pertinent Vitals/Pain Pain Assessment: No/denies pain Pain Intervention(s): Monitored during session;Repositioned     Hand Dominance Right   Extremity/Trunk Assessment Upper Extremity Assessment Upper Extremity Assessment: Generalized weakness   Lower Extremity Assessment Lower Extremity Assessment: Generalized weakness   Cervical / Trunk Assessment Cervical / Trunk Assessment: Normal   Communication Communication Communication: No difficulties   Cognition Arousal/Alertness: Awake/alert Behavior During Therapy: Flat affect Overall Cognitive  Status: Within Functional Limits for tasks assessed                                     General Comments  O2 at or higher than 95% throughout session; daughter present throughout     Exercises     Shoulder Instructions      Elizabeth expects to be discharged to:: Private residence Living Arrangements: Alone Available Help at Discharge: Family Type of Home: House Home Access: Level entry     Home Layout: One level     Bathroom Shower/Tub: Teacher, early years/pre: [place]: Bedside commode;Shower seat;Walker - 2 wheels;Hospital bed          Prior Functioning/Environment Level of Independence: Independent with assistive device(s)        Comments: reports 2L O2 [place] at home        OT Problem List: Decreased strength;Decreased activity tolerance;Pain;Decreased knowledge of use of DME or AE      OT Treatment/Interventions: Self-care/ADL training;DME and/or AE instruction;Energy conservation;Patient/family education;Therapeutic activities;Balance training    OT Goals(Current goals can be found in the care plan section) Acute Rehab OT Goals Patient Stated Goal: to return home OT Goal Formulation: With patient Time For Goal Achievement: 05/22/18 Potential to Achieve Goals: Good  OT Frequency:     Barriers to D/C:            Co-evaluation              AM-PAC OT "6 Clicks" Daily Activity     Outcome Measure Help from another person eating meals?: None Help from another person taking care of personal grooming?: A Little Help from another person toileting, which includes using toliet, bedpan, or urinal?: A Little Help from another person bathing (including washing, rinsing, drying)?: A Little Help from another person to put on and taking off regular upper body clothing?: A Little Help from another person to put on and taking off regular lower body clothing?: A Little 6 Click Score: 19   End of Session Equipment Utilized During Treatment: Gait belt;Rolling walker Nurse Communication: Mobility status;Other (comment)(requests strawberry ensure)  Activity Tolerance: Patient limited by  fatigue Patient left: in bed;with call bell/phone within reach;with bed alarm set;with family/visitor present  OT Visit Diagnosis: Muscle weakness (generalized) (M62.81)                Time: 5643-3295 OT Time Calculation (min): 28 min Charges:  OT General Charges $OT Visit: 1 Visit OT Evaluation $OT Eval Low Complexity: 1 Low OT Treatments $Self Care/Home Management : 8-22 mins  Hulda Humphrey OTR/L Acute Rehabilitation Services Pager: [phone] Office: [place] 05/08/2018, 1:15 PM

## 2018-05-08 NOTE — Discharge Summary (Signed)
Physician Discharge Summary  Michaela Little:[id number] DOB: 1961/01/09 DOA: 05/02/2018  PCP: Ladell Pier, MD  Admit date: 05/02/2018 Discharge date: 05/08/2018  Admitted From: Home Disposition:  home  Recommendations for Outpatient Follow-up:  1. Follow up with PCP in 1-2 weeks 2. 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 No nausea no vomiting. No blood in the stool. No diarrhea reported. No abdominal pain. Chest pain is reported as continues sharp pain worsening with breathing and pressure.  Discharge Diagnoses:  Principal Problem:   Sepsis ([place]) Active Problems:   OSA (obstructive sleep apnea)   Influenza   Pancytopenia ([place])   Patient is Jehovah's Witness   Pulmonary embolism ([place])   Metastatic breast cancer ([place])   Malnutrition of moderate degree   1. Sepsis secondary to influenza. -Pneumonia was ruled out. -Patient was started on empiric IV antibiotics which were later held -Completed course of tamiflu -Continue with anti-tussive meds as needed -Noted to have intermittent streaks of blood per sputum -Chest x-ray on 05/06/2018 does not show any acute worsening. Monitor on telemetry.  2. Pancytopenia with severe anemia -patient is Jehovah witness and refuses blood transfusion. Plan is to minimize blood draws and use pediatric tubes only -Pt is continued on therapeutic anticoagulation as long as she does not have any bleeding and as long as her creatinine remained stable. Recommend not to  recheck CBC unless there is a concern clinically. -Palliative Care was consulted for goals of care discussion. Last visit note from oncologist recommend DNR/DNI consideration. New acute thrombocytopenia suspected to be related to consumption. -Repeat CBC with stable Hgb and slightly improved platelet count  3.History of DVT. -Acute common femoral vein DVT along with SVT. -History of IVC filter placement in the past. -Patient previously Lovenox for therapeutic anticoagulation. -Pt had been continued on heparin gtt. -discussed case with Oncology. Recommendation to continue therapeutic lovenox. Known DVT appears to be improving -Heme-Onc recommendation for DNR/DNI-comfort care.  4.Chronic diastolic CHF. -Plan to resume lasix when pt able to tolerate  5. History of metastatic cancer patient stated that she is a 10-year survivor she is followed up by Dr. Burr Medico oncologist -Recent imaging demonstrates the patient's metastasis to bone as well as liver is worsening and oncology recommended DNR/DNI.  6. Chest x-ray showed some opacities.  -Less likely pneumonia. Will monitor. -Stable at present  7. Chest pain. - Noted to be pleuritic in nature. -Troponin serially neg  Discharge Instructions   Allergies as of 05/08/2018      Reactions   Lisinopril-hydrochlorothiazide Itching   Adhesive [tape] Itching, Other (See Comments)   Redness   Fentanyl Other (See Comments)   GI upset and drowsiness. Only to [facility]   Gabapentin Other (See Comments)   Auditory hallucinations   Hctz [hydrochlorothiazide] Itching   Latex Itching   Lisinopril Itching   Losartan Potassium Other (See Comments)   Makes her feel "bad"   Other Other (See Comments)   Patient refuses blood for religious reasons (per her notes)   Oxycodone    GI upset, drowsy   Tums [calcium Carbonate Antacid] Hives   FRUIT-FLAVORED  ONES      Medication List    STOP taking these medications   levofloxacin 500  MG tablet Commonly known as:  LEVAQUIN     TAKE these medications   acetaminophen 500 MG tablet Commonly known as:  TYLENOL Take 500 mg by mouth every 6 (six) hours as needed for mild pain.   amLODipine 10 MG tablet Commonly known as:  NORVASC Take 1 tablet (10 mg total) by mouth daily.   cetirizine 10 MG tablet Commonly known as:  ZYRTEC Take 10 mg by mouth daily.   cyanocobalamin 1000 MCG tablet Take 1 tablet (1,000 mcg total) by mouth daily.   docusate sodium 100 MG capsule Commonly known as:  COLACE Take 1 capsule (100 mg total) by mouth daily as needed for mild constipation.   enoxaparin 100 MG/ML injection Commonly known as:  LOVENOX Inject 1 mL (100 mg total) into the skin every 12 (twelve) hours.   exemestane 25 MG tablet Commonly known as:  AROMASIN Take 1 tablet (25 mg total) by mouth daily after breakfast.   feeding supplement (ENSURE ENLIVE) Liqd Take 237 mLs by mouth 2 (two) times daily between meals. What changed:  additional instructions   fluticasone 50 MCG/ACT nasal spray Commonly known as:  FLONASE Place 1 spray into both nostrils daily.   fluticasone furoate-vilanterol 100-25 MCG/INH Aepb Commonly known as:  BREO ELLIPTA Inhale 1 puff into the lungs daily. What changed:    when to take this  reasons to take this   folic acid 1 MG tablet Commonly known as:  FOLVITE Take 1 tablet (1 mg total) by mouth daily.   furosemide 20 MG tablet Commonly known as:  LASIX Take 20 mg by mouth daily as needed for fluid.   hydrALAZINE 10 MG tablet Commonly known as:  APRESOLINE Take 1 tablet (10 mg total) by mouth 3 (three) times daily.   ipratropium-albuterol 0.5-2.5 (3) MG/3ML Soln Commonly known as:  DUONEB INHALE THREE MLS VIA NEBULIZER EVERY 6 HOURS AS NEEDED What changed:  See the new instructions.   lactulose 10 GM/15ML solution Commonly known as:  CHRONULAC Take 15-30 mLs (10-20 g total) by mouth 3 (three) times daily.   MULTIVITAMIN  ADULT PO Take 1 tablet by mouth daily.   polyethylene glycol packet Commonly known as:  MIRALAX / GLYCOLAX Take 17 g by mouth daily.   traMADol 50 MG tablet Commonly known as:  ULTRAM Take 2 tablets (100 mg total) by mouth every 6 (six) hours. What changed:    when to take this  reasons to take this       Allergies  Allergen Reactions  . Lisinopril-Hydrochlorothiazide Itching  . Adhesive [Tape] Itching and Other (See Comments)    Redness  . Fentanyl Other (See Comments)    GI upset and drowsiness. Only to [facility]  . Gabapentin Other (See Comments)    Auditory hallucinations  . Hctz [Hydrochlorothiazide] Itching  . Latex Itching  . Lisinopril Itching  . Losartan Potassium Other (See Comments)    Makes her feel "bad"   . Other Other (See Comments)    Patient refuses blood for religious reasons (per her notes)  . Oxycodone     GI upset, drowsy  . Tums [Calcium Carbonate Antacid] Hives    FRUIT-FLAVORED ONES    Consultations:  Oncology  Procedures/Studies: Dg Chest 2 View  Result Date: 05/06/2018 CLINICAL DATA:  Acute onset of dizziness, generalized weakness and shortness of breath. EXAM: CHEST - 2 VIEW  COMPARISON:  05/04/2018 and earlier. FINDINGS: AP SEMI-ERECT and LATERAL images were obtained. Cardiac silhouette mildly enlarged, unchanged. Hilar and mediastinal contours otherwise unremarkable. Interstitial opacities throughout both lungs are unchanged since the examination 2 days ago. No new pulmonary parenchymal abnormalities. No visible pleural effusions. Degenerative changes involving the thoracic spine. IMPRESSION: Stable interstitial pulmonary edema versus interstitial pneumonia throughout both lungs when compared to the examination 2 days ago. No new abnormalities. Electronically Signed   By: Evangeline Dakin M.D.   On: 05/06/2018 12:29   Dg Chest 2 View  Result Date: 05/02/2018 CLINICAL DATA:  Initial evaluation for acute fever, productive cough. EXAM:  CHEST - 2 VIEW COMPARISON:  Prior radiograph from 04/25/2018 FINDINGS: Cardiac and mediastinal silhouettes are stable in size and contour, and remain within normal limits. Lungs normally inflated. Patchy infiltrate overlies the mid right lung base, concerning for infiltrate given the provided history of cough and fever. Few scattered additional opacities at the left lung base as well. No edema or effusion. No pneumothorax. Surgical clips overlie the left axilla.  No acute osseous finding. IMPRESSION: Scattered multifocal patchy bibasilar opacities, suspicious for possible infectious infiltrates given provided history of cough and fever. Electronically Signed   By: Jeannine Boga M.D.   On: 05/02/2018 23:25   Dg Chest 2 View  Result Date: 04/25/2018 CLINICAL DATA:  Fever and right flank pain since discharge. History of breast cancer. EXAM: CHEST - 2 VIEW COMPARISON:  Chest CT 04/02/2018 FINDINGS: Osteolytic metastatic disease of the included dorsal spine, shoulders, manubrium and sternum. Top-normal heart size. Nonaneurysmal thoracic aorta. No acute pulmonary consolidation, edema, effusion or pneumothorax. Left axillary clips are present. IMPRESSION: 1. No active cardiopulmonary disease. 2. Diffuse metastatic disease to bone. Electronically Signed   By: Ashley Royalty M.D.   On: 04/25/2018 00:55   Dg Abdomen 1 View  Result Date: 05/02/2018 CLINICAL DATA:  Initial evaluation for constipation. EXAM: ABDOMEN - 1 VIEW COMPARISON:  Prior CT from 04/02/2018 FINDINGS: Bowel gas pattern is nonobstructive. No abnormal bowel wall thickening. Large volume retained stool within the rectal vault/distal rectosigmoid colon. No abnormal calcification. IVC filter and cholecystectomy clips noted. 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 IMPRESSION: 1. Large volume retained stool within the distal rectosigmoid  colon/rectal vault, compatible with constipation. 2. 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 Electronically Signed   By: Jeannine Boga M.D.   On: 05/02/2018 23:27   Dg Chest Port 1 View  Result Date: 05/04/2018 CLINICAL DATA:  Shortness of breath. EXAM: PORTABLE CHEST 1 VIEW COMPARISON:  Chest radiograph 05/02/2018 FINDINGS: Monitoring leads overlie the patient. Stable cardiomegaly. Mild bilateral interstitial opacities bilaterally. No pleural effusion or pneumothorax. Read demonstrated osseous metastatic disease. IMPRESSION: Mild bilateral interstitial opacities may represent edema or atypical infectious process. Electronically Signed   By: Lovey Newcomer M.D.   On: 05/04/2018 13:59   Dg Chest Port 1 View  Result Date: 04/18/2018 CLINICAL DATA:  Productive cough and shortness of breath. History of breast cancer and hypertension. EXAM: PORTABLE CHEST 1 VIEW COMPARISON:  03/08/2018 and chest CTA dated 04/02/2018. FINDINGS: Stable mildly enlarged cardiac silhouette. Clear lungs with normal vascularity. Mild patchy bone sclerosis and multiple small lucent lesions in both shoulders. This was diffusely involving the regional skeleton on the recent CTA. Left axillary surgical clips. IMPRESSION: No acute abnormality. Stable mild cardiomegaly and diffuse bony metastatic disease. Electronically Signed  By: Claudie Revering M.D.   On: 04/18/2018 19:10   Ct Renal Stone Study  Result Date: 05/03/2018 CLINICAL DATA:  Abdominal pain.  Sepsis.  History of breast cancer. EXAM: CT ABDOMEN AND PELVIS WITHOUT CONTRAST TECHNIQUE: Multidetector CT imaging of the abdomen and pelvis was performed following the standard protocol without IV contrast. COMPARISON:  CT scan 04/02/2017 FINDINGS: Lower chest: Patchy bibasilar infiltrates suspicious for pneumonia or aspiration. No pleural effusion. Heart is normal in size. No pericardial effusion.  Hepatobiliary: Several vague ill-defined low-attenuation liver lesions are noted and highly suspicious for metastatic disease. Significant progression when compared to prior study from chest 1 month ago. The gallbladder is surgically absent. Mild stable common bile duct dilatation. Pancreas: No mass, inflammation or ductal dilatation. Spleen: Mild splenomegaly.  No definite splenic lesions. Adrenals/Urinary Tract: Thickened adrenal glands appears stable. No obvious lesions. There is a small lower pole right renal calculus but no obstructing ureteral calculi or bladder calculi. Lower pole right renal cyst is noted. The bladder is unremarkable. Stomach/Bowel: The stomach, duodenum, small bowel and colon are grossly normal. No acute inflammatory changes, mass lesions or obstructive findings. There is a large amount of stool throughout the colon and down into the rectum suggesting constipation. Vascular/Lymphatic: Scattered distal aortic and common iliac artery calcifications but no aneurysm. An IVC filter is noted. No mesenteric or retroperitoneal mass or lymphadenopathy. Reproductive: Surgically absent. Other: No free pelvic fluid collections or pelvic adenopathy. No inguinal adenopathy the. Musculoskeletal: Stable appearing diffuse osseous metastatic disease. No obvious pathologic fracture or spinal canal compromise. Left mastectomy noted. IMPRESSION: 1. No acute abdominal/pelvic findings. 2. Patchy bibasilar infiltrates. 3. Significant progression of hepatic metastatic disease since prior CT scan from 1 month ago. 4. Stable diffuse osseous metastatic disease. 5. Status post cholecystectomy with stable mild common bile duct dilatation. 6. Large amount of stool throughout the colon and down into the rectum suggesting constipation. Electronically Signed   By: Marijo Sanes M.D.   On: 05/03/2018 10:01   Vas [place] Lower Extremity Venous (dvt)  Result Date: 05/06/2018  Lower Venous Study Indications: Swelling.  Risk  Factors: DVT H/O 05/02/2017 obesity. Performing Technologist: Toma Copier RVS  Examination Guidelines: A complete evaluation includes B-mode imaging, spectral Doppler, color Doppler, and power Doppler as needed of all accessible portions of each vessel. Bilateral testing is considered an integral part of a complete examination. Limited examinations for reoccurring indications may be performed as noted.  Right Venous Findings: +---------+---------------+---------+-----------+----------+-------------------+          CompressibilityPhasicitySpontaneityPropertiesSummary             +---------+---------------+---------+-----------+----------+-------------------+ CFV      Partial                 Yes                  Acute DVT resolving +---------+---------------+---------+-----------+----------+-------------------+ SFJ      Partial                                      Acute DVT resolving +---------+---------------+---------+-----------+----------+-------------------+ FV Prox  Full           Yes      Yes                  DVT resolved        +---------+---------------+---------+-----------+----------+-------------------+ FV Mid   Full                                                             +---------+---------------+---------+-----------+----------+-------------------+  FV DistalFull           Yes      Yes                  DVT resolved        +---------+---------------+---------+-----------+----------+-------------------+ PFV      Full           Yes      Yes                  DVT resolved        +---------+---------------+---------+-----------+----------+-------------------+ POP      Full           Yes      Yes                                      +---------+---------------+---------+-----------+----------+-------------------+ PTV                                                   Unable to visualize                                                        well eough for                                                            optimal evaluation. +---------+---------------+---------+-----------+----------+-------------------+ PERO                                                  Unable to visualize                                                       well enough for                                                           optimal evaluation  +---------+---------------+---------+-----------+----------+-------------------+ GSV      Partial                 Yes                  Acute SVT resolving +---------+---------------+---------+-----------+----------+-------------------+  Right Technical Findings: Not visualized segments include peroneal and posterior tbial due to body habitus.  Left Venous Findings: +---------+---------------+---------+-----------+----------+-------------------+          CompressibilityPhasicitySpontaneityPropertiesSummary             +---------+---------------+---------+-----------+----------+-------------------+ CFV  Full                                                             +---------+---------------+---------+-----------+----------+-------------------+ SFJ      Full                                                             +---------+---------------+---------+-----------+----------+-------------------+ FV Prox  Full                                                             +---------+---------------+---------+-----------+----------+-------------------+ FV Mid   Full                                                             +---------+---------------+---------+-----------+----------+-------------------+ FV DistalFull                                                             +---------+---------------+---------+-----------+----------+-------------------+ PFV      Full                                                              +---------+---------------+---------+-----------+----------+-------------------+ POP      Full                                                             +---------+---------------+---------+-----------+----------+-------------------+ PTV                                                   Unable to fully                                                           visualize for  optimal evaluation  +---------+---------------+---------+-----------+----------+-------------------+ PERO                                                  Unable to fully                                                           visualize well                                                            enough for optimal                                                        evaluation          +---------+---------------+---------+-----------+----------+-------------------+  Left Technical Findings: Not visualized segments include peroneal and posterior tibial due to body habitus.   Summary: Right: Findings consistent with acute deep vein thrombosis involving the right common femoral vein. Findings consistent with acute superficial vein thrombosis involving the right great saphenous vein. No cystic structure found in the popliteal fossa. Resolving DVT and SVT Left: There is no evidence of deep vein thrombosis in the lower extremity. No cystic structure found in the popliteal fossa.  *See table(s) above for measurements and observations. Electronically signed by Deitra Mayo MD on 05/06/2018 at 4:54:23 PM.    Final    US Abdomen Limited Ruq  Result Date: 04/27/2018 CLINICAL DATA:  58 year old female with RIGHT UPPER quadrant abdominal pain. History of cholecystectomy and breast cancer. EXAM: ULTRASOUND ABDOMEN LIMITED RIGHT UPPER QUADRANT COMPARISON:  04/02/2018 CT prior studies FINDINGS: Gallbladder: The gallbladder is not  visualized compatible with cholecystectomy. Common bile duct: Diameter: 4 mm. No evidence of intrahepatic or extrahepatic biliary dilatation. Liver: At least 3 hypoechoic hepatic masses are noted including a 2.8 cm mass in the RIGHT liver, 1.9 cm mass in the RIGHT liver and a 1.4 cm mass in the LEFT liver. Portal vein is patent on color Doppler imaging with normal direction of blood flow towards the liver. IMPRESSION: 1. No acute abnormality. 2. At least 3 hypoechoic hepatic masses worrisome for metastatic disease. These were identified on recent CT. 3. No biliary dilatation. Electronically Signed   By: Margarette Canada M.D.   On: 04/27/2018 18:55    Subjective: Eager to go home  Discharge Exam: Vitals:   05/07/18 2044 05/08/18 0551  BP: (!) 144/73 (!) 117/57  Pulse: 75 73  Resp: 18 20  Temp: 98.2 F (36.8 C) 98.5 F (36.9 C)  SpO2: 100% 99%   Vitals:   05/07/18 1216 05/07/18 1404 05/07/18 2044 05/08/18 0551  BP: 127/74 (!) 155/77 (!) 144/73 (!) 117/57  Pulse: 75 75 75 73  Resp:  19 18 20   Temp:  98 F (36.7 C) 98.2 F (36.8 C) 98.5 F (36.9 C)  TempSrc:  Oral Oral Oral  SpO2:  100% 100% 99%  Weight:      Height:        General: Pt is alert, awake, not in acute distress Cardiovascular: RRR, S1/S2 +, no rubs, no gallops Respiratory: CTA bilaterally, no wheezing, no rhonchi Abdominal: Soft, NT, ND, bowel sounds + Extremities: no edema, no cyanosis   The results of significant diagnostics from this hospitalization (including imaging, microbiology, ancillary and laboratory) are listed below for reference.     Microbiology: Recent Results (from the past 240 hour(s))  Blood Culture (routine x 2)     Status: None   Collection Time: 05/02/18 10:21 PM  Result Value Ref Range Status   Specimen Description   Final    BLOOD RIGHT ANTECUBITAL Performed at [facility] [address] [place], [place] [address]    Special Requests   Final    BOTTLES DRAWN AEROBIC  AND ANAEROBIC Blood Culture adequate volume Performed at [facility] [address], [place], [place] [address]    Culture   Final    NO GROWTH 5 DAYS Performed at [facility], [place] [address], [place], [place] [address]    Report Status 05/08/2018 FINAL  Final  Urine culture     Status: None   Collection Time: 05/03/18 12:44 AM  Result Value Ref Range Status   Specimen Description URINE, RANDOM  Final   Special Requests   Final    Immunocompromised Performed at [facility], [place] [address], [place], [place] [address]    Culture NO GROWTH  Final   Report Status 05/04/2018 FINAL  Final  MRSA PCR Screening     Status: None   Collection Time: 05/03/18  9:57 AM  Result Value Ref Range Status   MRSA by PCR NEGATIVE NEGATIVE Final    Comment:        The GeneXpert MRSA Assay (FDA approved for NASAL specimens only), is one component of a comprehensive MRSA colonization surveillance program. It is not intended to diagnose MRSA infection nor to guide or monitor treatment for MRSA infections. Performed at [facility], [place] [address], [place], [name] [address]      Labs: BNP (last 3 results) Recent Labs    01/01/18 1838 02/27/18 1237 05/02/18 2221  BNP 25.8 15.6 24.2   Basic Metabolic Panel: Recent Labs  Lab 05/02/18 2221 05/04/18 0951 05/06/18 1241  NA 132* 135 134*  K 4.2 4.4 4.0  CL 101 109 106  CO2 22 18* 20*  GLUCOSE 125* 94 98  BUN 12 10 10   CREATININE 0.78 0.62 0.56  CALCIUM 9.4 8.2* 8.3*   Liver Function Tests: Recent Labs  Lab 05/02/18 2221 05/04/18 0951 05/06/18 1241  AST 43* 37 31  ALT 22 18 16   ALKPHOS 111 79 88  BILITOT 1.6* 0.5 0.6  PROT 7.9 6.2* 7.0  ALBUMIN 3.5 2.6* 3.2*   Recent Labs  Lab 05/02/18 2221  LIPASE 31   No results for input(s): AMMONIA in the last 168 hours. CBC: Recent Labs  Lab 05/02/18 2221 05/05/18 0435 05/06/18 1241 05/07/18 0401 05/08/18 0351   WBC 3.6*  --  2.6* 2.7* 2.9*  NEUTROABS 2.4  --  1.4*  --   --   HGB 4.5* 3.5* 3.8* 3.8* 3.6*  HCT 15.9* 13.0* 13.7* 12.9* 12.4*  MCV 97.0  --  100.0 94.9 94.7  PLT 142*  --  92* 83* 85*  Cardiac Enzymes: Recent Labs  Lab 05/02/18 2221 05/05/18 1004 05/06/18 2235  TROPONINI <0.03 <0.03 <0.03   BNP: Invalid input(s): POCBNP CBG: No results for input(s): GLUCAP in the last 168 hours. D-Dimer No results for input(s): DDIMER in the last 72 hours. Hgb A1c No results for input(s): HGBA1C in the last 72 hours. Lipid Profile No results for input(s): CHOL, HDL, LDLCALC, TRIG, CHOLHDL, LDLDIRECT in the last 72 hours. Thyroid function studies No results for input(s): TSH, T4TOTAL, T3FREE, THYROIDAB in the last 72 hours.  Invalid input(s): FREET3 Anemia work up No results for input(s): VITAMINB12, FOLATE, FERRITIN, TIBC, IRON, RETICCTPCT in the last 72 hours. Urinalysis    Component Value Date/Time   COLORURINE YELLOW 05/03/2018 0044   APPEARANCEUR CLEAR 05/03/2018 0044   LABSPEC 1.014 05/03/2018 0044   LABSPEC 1.025 01/11/2016 1233   PHURINE 6.0 05/03/2018 0044   GLUCOSEU NEGATIVE 05/03/2018 0044   GLUCOSEU Negative 01/11/2016 1233   HGBUR NEGATIVE 05/03/2018 0044   BILIRUBINUR NEGATIVE 05/03/2018 0044   BILIRUBINUR small 12/03/2016 1117   BILIRUBINUR Negative 01/11/2016 1233   KETONESUR NEGATIVE 05/03/2018 0044   PROTEINUR NEGATIVE 05/03/2018 0044   UROBILINOGEN 1.0 12/03/2016 1117   UROBILINOGEN 1.0 09/13/2016 1306   UROBILINOGEN 0.2 01/11/2016 1233   NITRITE NEGATIVE 05/03/2018 0044   LEUKOCYTESUR NEGATIVE 05/03/2018 0044   LEUKOCYTESUR Small 01/11/2016 1233   Sepsis Labs Invalid input(s): PROCALCITONIN,  WBC,  LACTICIDVEN Microbiology Recent Results (from the past 240 hour(s))  Blood Culture (routine x 2)     Status: None   Collection Time: 05/02/18 10:21 PM  Result Value Ref Range Status   Specimen Description   Final    BLOOD RIGHT ANTECUBITAL Performed at  [facility], [place] [address], [place], [place] [address]    Special Requests   Final    BOTTLES DRAWN AEROBIC AND ANAEROBIC Blood Culture adequate volume Performed at [place] [address], [place], [place] [address]    Culture   Final    NO GROWTH 5 DAYS Performed at [facility], [place] [address] [place], [place] [address]    Report Status 05/08/2018 FINAL  Final  Urine culture     Status: None   Collection Time: 05/03/18 12:44 AM  Result Value Ref Range Status   Specimen Description URINE, RANDOM  Final   Special Requests   Final    Immunocompromised Performed at [facility], [place] [address] [place], [place] [address]    Culture NO GROWTH  Final   Report Status 05/04/2018 FINAL  Final  MRSA PCR Screening     Status: None   Collection Time: 05/03/18  9:57 AM  Result Value Ref Range Status   MRSA by PCR NEGATIVE NEGATIVE Final    Comment:        The GeneXpert MRSA Assay (FDA approved for NASAL specimens only), is one component of a comprehensive MRSA colonization surveillance program. It is not intended to diagnose MRSA infection nor to guide or monitor treatment for MRSA infections. Performed at [facility], [place] [address], [place], [place] [address]    Time spent: 43min  SIGNED:   Marylu Lund, MD  [facility] 05/08/2018, 12:18 PM  If 7PM-7AM, please contact night-coverage

## 2018-05-08 NOTE — Evaluation (Signed)
Physical Therapy Evaluation Patient Details Name: Michaela Little MRN: [id number] DOB: 1960-05-19 Today's Date: 05/08/2018   History of Present Illness   58 y.o. 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 Pt currently admitted 05/03/18 for Sepsis secondary to influenza    Clinical Impression  Pt admitted with above diagnosis. Pt currently with functional limitations due to the deficits listed below (see PT Problem List). Pt will benefit from skilled PT to increase their independence and safety with mobility to allow discharge to the venue listed below.  Pt familiar to this PT from previous admission.  Pt again able ambulate 60 feet with RW on 1L O2 [place].  Uncertain if pt has been able to start HHPT with multiple readmissions, however this is recommended upon d/c.    Follow Up Recommendations Home health PT;Supervision for mobility/OOB    Equipment Recommendations  None recommended by PT    Recommendations for Other Services       Precautions / Restrictions Precautions Precautions: Fall Precaution Comments: low HGB, no blood products; pt reports 2L O2 [place] baseline Restrictions Weight Bearing Restrictions: No      Mobility  Bed Mobility Overal bed mobility: Needs Assistance Bed Mobility: Supine to Sit     Supine to sit: Supervision     General bed mobility comments: pt utilized elevated HOB  Transfers Overall transfer level: Needs assistance Equipment used: Rolling walker (2 wheeled) Transfers: Sit to/from Stand Sit to Stand: Min guard         General transfer comment: min/guard for safety (lines, low hgb)  Ambulation/Gait Ambulation/Gait assistance: Min guard Gait Distance (Feet): 60 Feet Assistive device: Rolling walker  (2 wheeled) Gait Pattern/deviations: Step-through pattern;Decreased stride length Gait velocity: decr   General Gait Details: slow but steady gait with RW, 1 standing rest breaks due to fatigue, distance to pt tolerance, remained on 1L O2 [place] (pt only on 1L in room and SPO2 100% after ambulating)  Stairs            Wheelchair Mobility    Modified Rankin (Stroke Patients Only)       Balance Overall balance assessment: Needs assistance         Standing balance support: No upper extremity supported Standing balance-Leahy Scale: Fair Standing balance comment: static balance fair however utilized RW for support with ambulation                             Pertinent Vitals/Pain Pain Assessment: No/denies pain Pain Intervention(s): Repositioned    Home Living Family/patient expects to be discharged to:: Private residence Living Arrangements: Alone Available Help at Discharge: Family Type of Home: House Home Access: Level entry     Home Layout: One level Home Equipment: Bedside commode;Shower seat;Walker - 2 wheels(hospital bed)      Prior Function Level of Independence: Independent with assistive device(s)         Comments: reports 2L O2 [place] at home     Hand Dominance        Extremity/Trunk Assessment                Communication   Communication: No difficulties  Cognition Arousal/Alertness: Awake/alert Behavior During Therapy: Flat affect Overall Cognitive Status: Within Functional Limits for tasks assessed  General Comments      Exercises     Assessment/Plan    PT Assessment Patient needs continued PT services  PT Problem List Decreased strength;Decreased activity tolerance;Decreased mobility;Decreased knowledge of use of DME;Decreased balance       PT Treatment Interventions DME instruction;Gait training;Functional mobility training;Therapeutic activities;Patient/family  education;Therapeutic exercise;Balance training    PT Goals (Current goals can be found in the Care Plan section)  Acute Rehab PT Goals PT Goal Formulation: With patient Time For Goal Achievement: 05/22/18 Potential to Achieve Goals: Good    Frequency Min 3X/week   Barriers to discharge        Co-evaluation               AM-PAC PT "6 Clicks" Mobility  Outcome Measure Help needed turning from your back to your side while in a flat bed without using bedrails?: A Little Help needed moving from lying on your back to sitting on the side of a flat bed without using bedrails?: A Little Help needed moving to and from a bed to a chair (including a wheelchair)?: A Little Help needed standing up from a chair using your arms (e.g., wheelchair or bedside chair)?: A Little Help needed to walk in hospital room?: A Little Help needed climbing 3-5 steps with a railing? : A Lot 6 Click Score: 17    End of Session Equipment Utilized During Treatment: Gait belt;Oxygen Activity Tolerance: Patient tolerated treatment well Patient left: with call bell/phone within reach;in chair;with family/visitor present   PT Visit Diagnosis: Muscle weakness (generalized) (M62.81)    Time: 5465-6812 PT Time Calculation (min) (ACUTE ONLY): 21 min   Charges:   PT Evaluation $PT Eval Low Complexity: [place], PT, DPT Acute Rehabilitation Services Office: [phone] Pager: [phone]  Trena Platt 05/08/2018, 11:47 AM

## 2018-05-08 NOTE — Progress Notes (Signed)
Discharge instruction reviewed with patient and daughter, pt waiting for daughter to come from work to take her home, acknowledged understanding. SRP, RN

## 2018-05-08 NOTE — Care Management Note (Signed)
Case Management Note  Patient Details  Name: Michaela Little MRN: [id number] Date of Birth: 11-20-1960  Subjective/Objective:                    Action/Plan:Pt will discharge with [facility], which she was active with.    Expected Discharge Date:  05/08/18               Expected Discharge Plan:  [place]  In-House Referral:     Discharge planning Services  CM Consult  Post Acute Care Choice:    Choice offered to:  Patient  DME Arranged:    DME Agency:     HH Arranged:  RN, PT [place] Agency:  [facility]  Status of Service:  Completed, signed off  If discussed at [place] of Stay Meetings, dates discussed:    Additional CommentsPurcell Mouton, RN 05/08/2018, 1:40 PM

## 2018-05-13 ENCOUNTER — Telehealth: Payer: Self-pay

## 2018-05-13 ENCOUNTER — Other Ambulatory Visit: Payer: Self-pay | Admitting: Interventional Radiology

## 2018-05-13 DIAGNOSIS — Z95828 Presence of other vascular implants and grafts: Secondary | ICD-10-CM

## 2018-05-13 NOTE — Telephone Encounter (Signed)
Faxed signed order to PT back to [facility]. Sent to HIM for scanning to chart.

## 2018-05-14 ENCOUNTER — Telehealth: Payer: Self-pay

## 2018-05-14 NOTE — Telephone Encounter (Signed)
Faxed signed order to [facility], sent to HIM for scanning to chart.

## 2018-05-14 NOTE — Progress Notes (Signed)
Michaela Little   [phone] Fax:(336) [phone]   Clinic Follow up Note   Patient Care Team: Ladell Pier, MD as PCP - General (Internal Medicine) [name], MD as Consulting Physician (Pulmonary Disease) [name], MD as Consulting Physician (Hematology) [name], NP as Nurse Practitioner (Hospice and Palliative Medicine) Clent Demark, PA-C as Physician Assistant (Physician Assistant)  Date of Service:  05/16/2018  CHIEF COMPLAINT: F/u of metastatic breast cancer and anemia   SUMMARY OF ONCOLOGIC HISTORY: Oncology History   Cancer Staging Malignant neoplasm of overlapping sites of left breast in female, estrogen receptor positive ([place]) Staging form: Breast, AJCC 7th Edition - Clinical: Stage IIIA (T2, N2, M0) - Unsigned       Malignant neoplasm of overlapping sites of left breast in female, estrogen receptor positive ([place])   05/2008 Cancer Diagnosis    Left sided breast cancer (no path report available)    11/2008 Pathologic Stage    Stage IIIA: T2 N2     Neo-Adjuvant Chemotherapy    Paclitaxel weekly x 12; doxorubicin and cyclophosphamide x 4 - both given with trifiparnib (treated at [facility] in [place])    11/2008 Definitive Surgery    Left modified radiation mastectomy: invasive lobular carcinoma, ER+, PR+, HER2/neu negative, 5/22 LN positive     - 03/2009 Radiation Therapy    Adjuvant radiation to left chest wall    2011 - 08/2014 Anti-estrogen oral therapy    Tamoxifen 20 mg used briefly; discontinued due to uterine lining concerns; changed to anastrozole 1 mg daily (began 07/2100); discontinued by patient summer of 2016    05/26/2015 Progression    Iliac bone biopsy showed metastatic carcinoma consistent with a breast primary, ER positive, PR and HER-2 negative. Her staging CT, and a PET scan was negative for visceral metastasis.    05/2015 - 01/2018 Anti-estrogen oral therapy    1. letrozole and Ibrance started March  2017             -Ibrance dose decreased to 75 mg daily. Stopped in 11/2017 due to anemia   -Letrozole switched to Exemestane on 02/13/17 due to b/l rib pain. She exemestane herself in 01/2018.      11/16/2015 Miscellaneous    Xgeva monthly for bone metastasis     07/03/2016 Imaging    CT chest, abdomen and pelvis with contrast showed no evidence of metastasis.     02/07/2017 Imaging    CT AP W Contrast 02/07/17 IMPRESSION: 1. No CT findings for abdominal/pelvic metastatic disease. 2. No acute abdominal findings, mass lesions or adenopathy. 3. Status post cholecystectomy with mild associated common bile duct dilatation. 4. Somewhat thickened appendix appears relatively stable. No acute inflammatory process. 5. Stable mixed lytic and sclerotic osseous metastatic disease.     02/07/2017 Imaging    Bone Scan Whole Body 02/07/17 IMPRESSION: No scintigraphic evidence skeletal metastasis     08/12/2017 Imaging    Whole Boday Scan 08/12/17 IMPRESSION: Scattered degenerative type uptake as above with a questionable focus of increased tracer localization at L2 vertebral body versus artifact; no abnormality is seen at this site by CT. Consider either characterization by MR or attention on follow-up imaging.    08/12/2017 Imaging    CT AP W Contrast 08/12/17  IMPRESSION: Continued thickening of the appendix is noted without surrounding inflammation. It has maximum measured diameter is decreased compared to prior exam. There is no evidence of acute inflammation. Stable mixed lytic and sclerotic appearance of  visualized skeleton is noted consistent with history of known osseous metastases. No acute abnormality seen in the abdomen or pelvis.    08/22/2017 Surgery    APPENDECTOMY LAPAROSCOPIC ERAS PATHWAY by Dr. Malcolm Metro and REMOVAL OF LEFT TISSUE EXPANDER by Dr. Harlow Mares 08/22/17    08/22/2017 Pathology Results    Diagnosis 08/22/17  1. Appendix, Other than Incidental - METASTATIC  CARCINOMA, CONSISTENT WITH BREAST PRIMARY. - CARCINOMA IS PRESENT AT THE SURGICAL RESECTION MARGIN AS WELL AS THE SEROSAL SURFACE. - LYMPHOVASCULAR INVASION IS IDENTIFIED. - SEE COMMENT. 2. Breast, capsule, Left - DENSE FIBROUS TISSUE WITH MILD INFLAMMATION, INCLUDING A FEW SCATTERED MULTINUCLEATED GIANT CELLS. - THERE IS NO EVIDENCE OF MALIGNANCY.    08/27/2017 Imaging    CT AP W Contrast 08/27/17 IMPRESSION: No evidence of pulmonary embolus. Small pericardial effusion with uncertain clinical significance. No evidence of acute abnormalities within the chest, abdomen or pelvis. Area of fat stranding and small focus of gas within the anterior abdominal wall, immediately inferior to the umbilicus, probably at the site of laparoscopic access.    10/07/2017 Echocardiogram    10/07/2017 ECO Study Conclusions  - Procedure narrative: Transthoracic echocardiography. Image   quality was adequate. The study was technically difficult, as a   result of poor acoustic windows. - Left ventricle: The cavity size was normal. Wall thickness was   normal. Systolic function was normal. The estimated ejection   fraction was in the range of 60% to 65%. Wall motion was normal;   there were no regional wall motion abnormalities. Features are   consistent with a pseudonormal left ventricular filling pattern,   with concomitant abnormal relaxation and increased filling   pressure (grade 2 diastolic dysfunction).    01/16/2018 PET scan    01/16/2018 PET Scan IMPRESSION: 1. Most striking finding is intense marrow activity diffusely throughout the axillary and appendicular skeleton. This is favored a physiologic marrow response from chronic anemia rather than diffuse metastatic disease. 2. Several discrete foci of more intense activity noted in the spine which could indicate malignancy or trauma. Difficult to define malignancy on the background of diffuse marrow activity. 3. Diffuse sclerosis throughout  the bones similar to a recent CT scans but new from remote PET-CT scan 03/01/2016. 4. No evidence soft tissue metastasis.     04/02/2018 Imaging    CT Angio CAP 04/02/18  IMPRESSION: 1. Multiple new hepatic lesions highly concerning for developing metastatic disease to the liver. This could be confirmed with nonemergent MRI of the abdomen with and without IV gadolinium if clinically appropriate. 2. Widespread metastatic disease to the bones also appears progressive compared to prior examinations. 3. Large right-sided pelvic deep venous thrombosis extending from the right common femoral vein to the proximal right external iliac vein. IVC filter is in position. No clot identified in the IVC filter at this time. No pulmonary embolism on today's examination. 4. Aortic atherosclerosis. 5. Additional incidental findings, as above.    05/01/2018 -  Anti-estrogen oral therapy    Fulvestrant injections '500mg'$  monthly starting 05/01/18      CURRENT THERAPY:  -Aranesp injection every 2 weeks -B12 injectionsmonthly  -Lovenox '120mg'$  q12h, changed to '100mg'$  every 12 hours in early Feb 2020 (adjusted based on her weight) -Monthly fulvestrant injection'500mg'$  starting on 05/03/18 with loading dose for first month    INTERVAL HISTORY:  [employer] is here for a follow up of treatment. She presents to the clinic today with her daughter. She notes she is feeling slightly better but  overall stable. She notes she has had elevated Temperature. She notes her cough is still aggressive with pinkish phlegm. The pinkish color is occasional. Her b/l flank and back is painful which she attributes to her cough. She is taking Tramadol '50mg'$  once a day and has been helping her pain. This pain is manageable most of the time, 6-10/10. She feels she has a higher-pain tolerance.   She does not like doing Lovenox injections twice a day but it is tolerable.    REVIEW OF SYSTEMS:   Constitutional: Denies fevers, chills or  abnormal weight loss Eyes: Denies blurriness of vision Ears, nose, mouth, throat, and face: Denies mucositis or sore throat Respiratory: Denies dyspnea or wheezes (+) Aggressive cough with occasional pinking phlegm.  Cardiovascular: Denies palpitation, chest discomfort or lower extremity swelling Gastrointestinal:  Denies nausea, heartburn or change in bowel habits Skin: Denies abnormal skin rashes MSK: (+) B/l flank and back pain  Lymphatics: Denies new lymphadenopathy or easy bruising Neurological:Denies numbness, tingling or new weaknesses Behavioral/Psych: Mood is stable, no new changes  All other systems were reviewed with the patient and are negative.  MEDICAL HISTORY:  Past Medical History:  Diagnosis Date  . Anxiety   . Arthritis    hands, knees, hips  . Asthma   . Bronchitis   . Cancer ([place])   . Cervical stenosis (uterine cervix)   . Disorder of appendix    Enlarged  . Dyspnea on exertion   . GERD (gastroesophageal reflux disease)   . Headache(784.0)   . History of breast cancer    2010--  LEFT  s/p  mastectomy (in [place]) AND CHEMORADIATION--  NO RECURRENCE  . History of cervical dysplasia   . Hyperlipidemia   . Hypertension   . Malignant neoplasm of overlapping sites of left breast in female, estrogen receptor positive ([place]) 03/05/2013  . OSA (obstructive sleep apnea) moderate osa per study  09/2010   CPAP  , NOT USING ON REGULAR BASIS  . Pelvic pain in female   . Positive H. pylori test    08-05-2013  . Positive TB test    AS TE[place]--  [place]/ MEDS  . Refusal of blood transfusions as patient is Jehovah's Witness   . Seasonal allergies   . Uterine fibroid   . Wears glasses     SURGICAL HISTORY: Past Surgical History:  Procedure Laterality Date  . APPENDECTOMY  y  . CARDIOVASCULAR STRESS TEST  10-29-2012   low risk perfusion study/  no significant reversibity/ ef 66%/  normal wall motion  . CERVICAL CONIZATION W/BX  2012   in  [place] N/A  05/14/2012   Procedure: LAPAROSCOPIC CHOLECYSTECTOMY;  Surgeon: Ralene Ok, MD;  Location: [facility];  Service: General;  Laterality: N/A;  . COLONOSCOPY  2011   normal per patient - [place]  . DILATION AND CURETTAGE OF UTERUS  10/15/2011   Procedure: DILATATION AND CURETTAGE;  Surgeon: Melina Schools, MD;  Location: [facility];  Service: Gynecology;  Laterality: N/A;  Conization &  endocervical curettings  . EXAMINATION UNDER ANESTHESIA N/A 08/20/2013   Procedure: EXAM UNDER ANESTHESIA;  Surgeon: Margarette Asal, MD;  Location: [facility];  Service: Gynecology;  Laterality: N/A;  . [place]   right  . IR IVC FILTER PLMT / S&I /IMG GUID/MOD SED  03/01/2018  . IR RADIOLOGIST EVAL & MGMT  04/08/2018  . LAPAROSCOPIC APPENDECTOMY N/A 08/22/2017   Procedure: APPENDECTOMY LAPAROSCOPIC ERAS PATHWAY;  Surgeon: Coralie Keens, MD;  Location: [facility];  Service: General;  Laterality: N/A;  . LAPAROSCOPIC ASSISTED VAGINAL HYSTERECTOMY N/A 10/26/2014   Procedure: HYSTERECTOMY ABDOMINAL ;  Surgeon: Molli Posey, MD;  Location: [facility];  Service: Gynecology;  Laterality: N/A;  . MASTECTOMY Left 11/2008  in [place]   . SALPINGOOPHORECTOMY Bilateral 10/26/2014   Procedure: BILATERAL SALPINGO OOPHORECTOMY;  Surgeon: Molli Posey, MD;  Location: [facility];  Service: Gynecology;  Laterality: Bilateral;  . TISSUE EXPANDER PLACEMENT Left 08/22/2017   Procedure: REMOVAL OF LEFT TISSUE EXPANDER;  Surgeon: Crissie Reese, MD;  Location: [facility];  Service: Plastics;  Laterality: Left;  . TISSUE EXPANDER REMOVAL Left 08/22/2017  . TRANSTHORACIC ECHOCARDIOGRAM  10-29-2012   mild lvh/  ef 60-65%/  grade II diastolic dysfunction/  trivial mr  &  tr    I have reviewed the social history and family history with the patient and they are unchanged from previous note.  ALLERGIES:  is allergic to lisinopril-hydrochlorothiazide; adhesive [tape]; fentanyl; gabapentin; hctz [hydrochlorothiazide];  latex; lisinopril; losartan potassium; other; oxycodone; and tums [calcium carbonate antacid].  MEDICATIONS:  Current Outpatient Medications  Medication Sig Dispense Refill  . acetaminophen (TYLENOL) 500 MG tablet Take 500 mg by mouth every 6 (six) hours as needed for mild pain.    Marland Kitchen amLODipine (NORVASC) 10 MG tablet Take 1 tablet (10 mg total) by mouth daily. 30 tablet 0  . cetirizine (ZYRTEC) 10 MG tablet Take 10 mg by mouth daily.  11  . docusate sodium (COLACE) 100 MG capsule Take 1 capsule (100 mg total) by mouth daily as needed for mild constipation. 10 capsule 0  . enoxaparin (LOVENOX) 100 MG/ML injection Inject 1 mL (100 mg total) into the skin every 12 (twelve) hours. 60 Syringe 2  . feeding supplement, ENSURE ENLIVE, (ENSURE ENLIVE) LIQD Take 237 mLs by mouth 2 (two) times daily between meals. (Patient taking differently: Take 237 mLs by mouth 2 (two) times daily between meals. Non-dairy Ensure) 237 mL 12  . fluticasone (FLONASE) 50 MCG/ACT nasal spray Place 1 spray into both nostrils daily.  2  . fluticasone furoate-vilanterol (BREO ELLIPTA) 100-25 MCG/INH AEPB Inhale 1 puff into the lungs daily. 28 each 5  . folic acid (FOLVITE) 1 MG tablet Take 1 tablet (1 mg total) by mouth daily. 90 tablet 1  . ipratropium-albuterol (DUONEB) 0.5-2.5 (3) MG/3ML SOLN INHALE THREE MLS VIA NEBULIZER EVERY 6 HOURS AS NEEDED (Patient taking differently: Take 3 mLs by nebulization every 6 (six) hours as needed (for wheezing or shortness of breath). ) 360 mL 5  . lactulose (CHRONULAC) 10 GM/15ML solution Take 15-30 mLs (10-20 g total) by mouth 3 (three) times daily. 236 mL 1  . polyethylene glycol (MIRALAX / GLYCOLAX) packet Take 17 g by mouth daily. 14 each 0  . promethazine-dextromethorphan (PROMETHAZINE-DM) 6.25-15 MG/5ML syrup Take 2.5 mLs by mouth 4 (four) times daily as needed for cough. 118 mL 0  . traMADol (ULTRAM) 50 MG tablet Take 2 tablets (100 mg total) by mouth every 6 (six) hours. (Patient  taking differently: Take 100 mg by mouth every 6 (six) hours as needed for moderate pain or severe pain. ) 30 tablet 0  . vitamin B-12 1000 MCG tablet Take 1 tablet (1,000 mcg total) by mouth daily. 30 tablet 0  . furosemide (LASIX) 20 MG tablet Take 20 mg by mouth daily as needed for fluid.    . hydrALAZINE (APRESOLINE) 10 MG tablet Take 1 tablet (  10 mg total) by mouth 3 (three) times daily. 90 tablet 0  . Multiple Vitamins-Minerals (MULTIVITAMIN ADULT PO) Take 1 tablet by mouth daily.     No current facility-administered medications for this visit.     PHYSICAL EXAMINATION: ECOG PERFORMANCE STATUS: 3 - Symptomatic, >50% confined to bed  Vitals:   05/16/18 1419  BP: 138/65  Pulse: 91  Resp: 18  Temp: (!) 97.5 F (36.4 C)  SpO2: 100%   Filed Weights   05/16/18 1419  Weight: 225 lb 3.2 oz (102.2 kg)    GENERAL:alert, no distress and comfortable SKIN: skin color, texture, turgor are normal, no rashes or significant lesions EYES: normal, Conjunctiva are pink and non-injected, sclera clear OROPHARYNX:no exudate, no erythema and lips, buccal mucosa, and tongue normal  NECK: supple, thyroid normal size, non-tender, without nodularity LYMPH:  no palpable lymphadenopathy in the cervical, axillary or inguinal LUNGS: clear to auscultation and percussion with normal breathing effort HEART: regular rate & rhythm and no murmurs and no lower extremity edema ABDOMEN:abdomen soft, non-tender and normal bowel sounds Musculoskeletal:no cyanosis of digits and no clubbing  NEURO: alert & oriented x 3 with fluent speech, no focal motor/sensory deficits  LABORATORY DATA:  I have reviewed the data as listed CBC Latest Ref Rng & Units 05/16/2018 05/08/2018 05/07/2018  WBC 4.0 - 10.5 K/uL 3.0(L) 2.9(L) 2.7(L)  Hemoglobin 12.0 - 15.0 g/dL 4.2(LL) 3.6(LL) 3.8(LL)  Hematocrit 36.0 - 46.0 % 14.6(L) 12.4(L) 12.9(L)  Platelets 150 - 400 K/uL 105(L) 85(L) 83(L)     CMP Latest Ref Rng & Units 05/16/2018  05/06/2018 05/04/2018  Glucose 70 - 99 mg/dL 107(H) 98 94  BUN 6 - 20 mg/dL '11 10 10  '$ Creatinine 0.44 - 1.00 mg/dL 0.78 0.56 0.62  Sodium 135 - 145 mmol/L 141 134(L) 135  Potassium 3.5 - 5.1 mmol/L 4.3 4.0 4.4  Chloride 98 - 111 mmol/L 106 106 109  CO2 22 - 32 mmol/L 25 20(L) 18(L)  Calcium 8.9 - 10.3 mg/dL 10.0 8.3(L) 8.2(L)  Total Protein 6.5 - 8.1 g/dL 7.2 7.0 6.2(L)  Total Bilirubin 0.3 - 1.2 mg/dL 1.2 0.6 0.5  Alkaline Phos 38 - 126 U/L 141(H) 88 79  AST 15 - 41 U/L 23 31 37  ALT 0 - 44 U/L '8 16 18      '$ RADIOGRAPHIC STUDIES: I have personally reviewed the radiological images as listed and agreed with the findings in the report. No results found.   ASSESSMENT & PLAN:  Michaela Little is a 58 y.o. female with   1. 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 -Unfortunately she developed bone metastasis 1 year after she stoppinganastrozole.She was treated withAI and Ibrance.Ibrance wasstopped in 11/2017 due to severe anemia. She also stoppedexemestane in late November 2019 herself. -Monthly Xgeva injection has been held since September 2019when she developed severe anemia -Wepreviouslydiscussed her CT CAP from 04/02/18 which shows disease progression in bone and metastasis to liver. 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 Continue every 2 weeks for the first few injections then monthly. She first injection well.  -Her overall clinical condition is stable overall including body pain and cough.  -Labs reviewed, WBC at 3, Hg at 4.2, PLT at 105K, ANC at 1.6, BG at  107, Alk Phos at 141. Will do labs through  finger stick  -Will proceed with Aranesp and second dose Fulvestrant injection today  -F/u in 2 weeks   2. 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  She received a Zometa on April 15, 2018 for severe hypercalcemia. -Plan to restart Delton See aorund April 25th 2020. Continue to monitor renal function and hypercalcemia.   3.Severe anemia  -She developed worsening anemia in September 2019, lowest hemoglobin 3.9, likely secondary to [place] and diffuse bone metastasis  -She is a Jehovah witness, refuses blood transfusion -She has been on Aranesp injection since then, anemia slowly improving. Will continue '500mg'$  injections every 2 weeks, will stop if Hb above 7-8.  -I disucssed moving her Aranesp injections to every 4 weeks. She will consider.  -will continue B12 injection monthly -HG at 4.2 today (05/16/18). Proceed with Aranesp injection today, B12 and Aransep injection in 2 weeks.    4. Right LE DVT, Recent PE -She was advised to restarted Lovenox '80mg'$ , but stopped on her own due to the concerns of bleeding -She was recently hospitalized on 02/27/18 for multiple PE. This is likely related to metastatic cancer, immobility and Aranesp injections.Patient wishes to continue Aranesp injection despite the risk of thrombosis. Nunzio Cory repeatedlydeclined thrombectomy and Angovac which was offeredby IR Dr. Geroge Baseman  -Her ankle swelling had significantly decreased but still has stable swelling in her knee and upper leg. Stable.  -Due to her recent weight loss, She is currnetly on Lovenox 100 mg every 12 hours, continue.   5. Cough and Dyspnea -f/u with pulmonologist Dr. Melvyn Novas -on Flovent,Cough improved with Hycodan, continue  -Cough is stable now with occasional pinkish phlegm. Per her PCP, she will have a chest Xray today at [facility] (05/16/18)   6. Body pain -She has been having bilateral rib cage  pain, probably related to her bone metastasis -Take tramadol as needed -Body pain is manageable with tramadol,she only takes once a day. Pain mostly form aggressive cough.  -She does not feel she needs stronger pain medication.   7. Goal of care discussion -We again discussed the incurable nature of her cancer, and the overall poor prognosis, especially if she does not have good response to chemotherapy or progress on chemo -The patient understands the goal of care is palliative. -I recommend DNR/DNI,we previously had long conversation about her goal of care, her friends also encouraged her to consider DNR,she will think about it.   Plan  -I refilled Folic Acid today  -Continue '100mg'$  Lovenox injections BID at home  -Will continue Aranesp today and every 2 weeks, and B12 monthly -We will proceed with fulvestrant injection today, next dose in 2 weeks, then change to monthly. -Lab, f/u and injections in 2 weeks and 4 weeks    No problem-specific Assessment & Plan notes found for this encounter.   No orders of the defined types were placed in this encounter.  All questions were answered. The patient knows to call the clinic with any problems, questions or concerns. No barriers to learning was detected. I spent 20 minutes counseling the patient face to face. The total time spent in the appointment was 25 minutes and more than 50% was on counseling and review of test results     [name], MD 05/16/2018   I, Joslyn Devon, am acting as scribe for [name], MD.   I have reviewed the above documentation for accuracy and  completeness, and I agree with the above.

## 2018-05-14 NOTE — Progress Notes (Signed)
Request received twelve days ago.  Closed at this time.

## 2018-05-15 ENCOUNTER — Ambulatory Visit: Payer: Medicare Other | Attending: Critical Care Medicine | Admitting: Critical Care Medicine

## 2018-05-15 ENCOUNTER — Other Ambulatory Visit: Payer: Medicare Other | Admitting: Primary Care

## 2018-05-15 ENCOUNTER — Encounter: Payer: Self-pay | Admitting: Critical Care Medicine

## 2018-05-15 VITALS — BP 123/74 | HR 87 | Temp 98.6°F | Ht 67.0 in | Wt 226.0 lb

## 2018-05-15 DIAGNOSIS — E440 Moderate protein-calorie malnutrition: Secondary | ICD-10-CM

## 2018-05-15 DIAGNOSIS — I5189 Other ill-defined heart diseases: Secondary | ICD-10-CM

## 2018-05-15 DIAGNOSIS — D649 Anemia, unspecified: Secondary | ICD-10-CM

## 2018-05-15 DIAGNOSIS — C7951 Secondary malignant neoplasm of bone: Secondary | ICD-10-CM

## 2018-05-15 DIAGNOSIS — J45909 Unspecified asthma, uncomplicated: Secondary | ICD-10-CM

## 2018-05-15 DIAGNOSIS — I824Y1 Acute embolism and thrombosis of unspecified deep veins of right proximal lower extremity: Secondary | ICD-10-CM

## 2018-05-15 DIAGNOSIS — C50912 Malignant neoplasm of unspecified site of left female breast: Secondary | ICD-10-CM

## 2018-05-15 DIAGNOSIS — I2699 Other pulmonary embolism without acute cor pulmonale: Secondary | ICD-10-CM

## 2018-05-15 DIAGNOSIS — C50919 Malignant neoplasm of unspecified site of unspecified female breast: Secondary | ICD-10-CM

## 2018-05-15 DIAGNOSIS — Z789 Other specified health status: Secondary | ICD-10-CM

## 2018-05-15 DIAGNOSIS — Z515 Encounter for palliative care: Secondary | ICD-10-CM

## 2018-05-15 DIAGNOSIS — I10 Essential (primary) hypertension: Secondary | ICD-10-CM

## 2018-05-15 DIAGNOSIS — J111 Influenza due to unidentified influenza virus with other respiratory manifestations: Secondary | ICD-10-CM

## 2018-05-15 DIAGNOSIS — D61818 Other pancytopenia: Secondary | ICD-10-CM

## 2018-05-15 DIAGNOSIS — IMO0001 Reserved for inherently not codable concepts without codable children: Secondary | ICD-10-CM

## 2018-05-15 DIAGNOSIS — I519 Heart disease, unspecified: Secondary | ICD-10-CM

## 2018-05-15 MED ORDER — PROMETHAZINE-DM 6.25-15 MG/5ML PO SYRP: 2.5000 mL | ORAL_SOLUTION | Freq: Four times a day (QID) | ORAL | 0 refills | Status: DC | PRN

## 2018-05-15 NOTE — Progress Notes (Signed)
Subjective:    Patient ID: Michaela Little, female    DOB: 07-12-1960, 58 y.o.   MRN: [id number]  This is a 58 year old female who has recent admission between the seventh and 13 February for influenza pneumonia.  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  She also has had previous pulmonary embolism and is on chronic Lovenox.  She is a Restaurant manager, fast food and has pancytopenia and has been having a decline in her status.  She has been on Aranesp in the office setting with oncology as she refuses blood transfusions due to her religion.  She was admitted for fever sepsis and found to have a urinary tract infection and also influenza.  She was given empiric antibiotics for her lungs.  The patient had increased cough and wheezing.  She does have chronic asthma and is on Brio normally.  The patient will slowly improved.  She was scanned and found to have metastatic breast cancer into the bones and liver.  She has been seen by palliative care and has been followed as an outpatient as well.  The patient does have home physical therapy set up.  She is has an appoint with oncology within the next week.  She is here to reestablish with primary care post hospital.  Below are excerpts from the patient's discharge summary. Admit date: 05/02/2018 Discharge date: 05/08/2018  Brief/Interim Summary: 58 year old female with history of metastatic breast cancer survivor 10-year survivor pulmonary embolism on Lovenox pancytopenia to have a weakness declining blood transfusion, hypertension she was admitted for fever due to urinary tract infection also found to have productive cough and positive influenza  Subjective: Continues to report some chest pain. No nausea no vomiting. No blood in the stool. No diarrhea reported. No abdominal pain. Chest pain is reported as continues sharp pain worsening with breathing and pressure.  Discharge Diagnoses:  Principal Problem:   Sepsis  ([place]) Active Problems:   OSA (obstructive sleep apnea)   Influenza   Pancytopenia ([place])   Patient is Jehovah's Witness   Pulmonary embolism ([place])   Metastatic breast cancer ([place])   Malnutrition of moderate degree   1. Sepsis secondary to influenza. -Pneumoniawasruled out. -Patient was started on empiric IV antibiotics which were later held -Completed course of tamiflu -Continuewith anti-tussive meds as needed -Noted to have intermittent streaks of blood per sputum -Chest x-ray on 05/06/2018 does not show any acute worsening. Monitor on telemetry.  2. Pancytopenia with severe anemia -patient is Jehovah witness and refuses blood transfusion. Plan is to minimize blood draws and use pediatric tubes only -Pt is continued ontherapeutic anticoagulation as long as she does not have any bleeding and as long as her creatinine remained stable. Recommend not to recheck CBC unless there is a concern clinically. -Palliative Care was consultedfor goals of care discussion. Last visit note from oncologist recommend DNR/DNI consideration. New acute thrombocytopeniasuspected to be related toconsumption. -Repeat CBC with stable Hgb and slightly improved platelet count  3.History of DVT. -Acute common femoral vein DVT along with SVT. -History of IVC filter placement in the past. -PatientpreviouslyLovenox for therapeutic anticoagulation. -Pt had been continued on heparin gtt. -discussed case with Oncology. Recommendation to continue therapeutic lovenox. Known DVT appears to be improving -Heme-Onc recommendation forDNR/DNI-comfort care.  4.Chronic diastolic CHF. -Plan to resume lasix when pt able to tolerate  5. History of metastatic cancer patient stated that she is a 10-year survivor she is followed up by Dr. Burr Medico oncologist -Recent  imaging demonstratesthe patient's metastasis to bone as well as liver is worsening and oncology recommended DNR/DNI.  6. Chest x-ray  showed some opacities. -Less likely pneumonia. Will monitor. -Stable at present  7. Chest pain. - Noted to be pleuritic in nature. -Troponinserially neg  The patient is still experiencing chest pain she is coughing up pink material.  There is still some dyspnea.  She has oxygen in the home as well.    Past Medical History:  Diagnosis Date  . Anxiety   . Arthritis    hands, knees, hips  . Asthma   . Bronchitis   . Cancer ([place])   . Cervical stenosis (uterine cervix)   . Disorder of appendix    Enlarged  . Dyspnea on exertion   . GERD (gastroesophageal reflux disease)   . Headache(784.0)   . History of breast cancer    2010--  LEFT  s/p  mastectomy (in [place]) AND CHEMORADIATION--  NO RECURRENCE  . History of cervical dysplasia   . Hyperlipidemia   . Hypertension   . Malignant neoplasm of overlapping sites of left breast in female, estrogen receptor positive ([place]) 03/05/2013  . OSA (obstructive sleep apnea) moderate osa per study  09/2010   CPAP  , NOT USING ON REGULAR BASIS  . Pelvic pain in female   . Positive H. pylori test    08-05-2013  . Positive TB test    AS TEEN--  [place] W/ MEDS  . Refusal of blood transfusions as patient is Jehovah's Witness   . Seasonal allergies   . Uterine fibroid   . Wears glasses      Family History  Problem Relation Age of Onset  . Breast cancer Mother   . Colon cancer Mother   . Hypotension Mother   . Asthma Mother   . Diabetes type II Mother   . Arthritis Mother   . Clotting disorder Mother   . Cancer Mother        breast/colon  . Mental illness Brother   . Heart disease Brother   . Cerebral palsy Daughter   . Emphysema Brother        never smoker  . Colon cancer Maternal Aunt   . Cancer Maternal Aunt        colon  . Colon cancer Maternal Uncle   . Cancer Maternal Uncle        colon  . Esophageal cancer Neg Hx   . Rectal cancer Neg Hx   . Stomach cancer Neg Hx   . Thyroid disease Neg Hx      Social History    Socioeconomic History  . Marital status: Single    Spouse name: Not on file  . Number of children: 5  . Years of education: Not on file  . Highest education level: Not on file  Occupational History  . Occupation: Disabled  Social Needs  . Financial resource strain: Not on file  . Food insecurity:    Worry: Not on file    Inability: Not on file  . Transportation needs:    Medical: Not on file    Non-medical: Not on file  Tobacco Use  . Smoking status: Former Smoker    Packs/day: 0.30    Years: 10.00    Pack years: 3.00    Types: Cigarettes    Last attempt to quit: 03/26/1988    Years since quitting: 30.1  . Smokeless tobacco: Never Used  Substance and Sexual Activity  . Alcohol use:  No  . Drug use: No  . Sexual activity: Not Currently  Lifestyle  . Physical activity:    Days per week: Not on file    Minutes per session: Not on file  . Stress: Not on file  Relationships  . Social connections:    Talks on phone: Not on file    Gets together: Not on file    Attends religious service: Not on file    Active member of club or organization: Not on file    Attends meetings of clubs or organizations: Not on file    Relationship status: Not on file  . Intimate partner violence:    Fear of current or ex partner: Not on file    Emotionally abused: Not on file    Physically abused: Not on file    Forced sexual activity: Not on file  Other Topics Concern  . Not on file  Social History Narrative  . Not on file     Allergies  Allergen Reactions  . Lisinopril-Hydrochlorothiazide Itching  . Adhesive [Tape] Itching and Other (See Comments)    Redness  . Fentanyl Other (See Comments)    GI upset and drowsiness. Only to [facility]  . Gabapentin Other (See Comments)    Auditory hallucinations  . Hctz [Hydrochlorothiazide] Itching  . Latex Itching  . Lisinopril Itching  . Losartan Potassium Other (See Comments)    Makes her feel "bad"   . Other Other (See Comments)     Patient refuses blood for religious reasons (per her notes)  . Oxycodone     GI upset, drowsy  . Tums [Calcium Carbonate Antacid] Hives    FRUIT-FLAVORED ONES     Outpatient Medications Prior to Visit  Medication Sig Dispense Refill  . acetaminophen (TYLENOL) 500 MG tablet Take 500 mg by mouth every 6 (six) hours as needed for mild pain.    Marland Kitchen amLODipine (NORVASC) 10 MG tablet Take 1 tablet (10 mg total) by mouth daily. 30 tablet 0  . cetirizine (ZYRTEC) 10 MG tablet Take 10 mg by mouth daily.  11  . docusate sodium (COLACE) 100 MG capsule Take 1 capsule (100 mg total) by mouth daily as needed for mild constipation. 10 capsule 0  . enoxaparin (LOVENOX) 100 MG/ML injection Inject 1 mL (100 mg total) into the skin every 12 (twelve) hours. 60 Syringe 2  . feeding supplement, ENSURE ENLIVE, (ENSURE ENLIVE) LIQD Take 237 mLs by mouth 2 (two) times daily between meals. (Patient taking differently: Take 237 mLs by mouth 2 (two) times daily between meals. Non-dairy Ensure) 237 mL 12  . fluticasone (FLONASE) 50 MCG/ACT nasal spray Place 1 spray into both nostrils daily.  2  . folic acid (FOLVITE) 1 MG tablet Take 1 tablet (1 mg total) by mouth daily. 30 tablet 0  . hydrALAZINE (APRESOLINE) 10 MG tablet Take 1 tablet (10 mg total) by mouth 3 (three) times daily. 90 tablet 0  . ipratropium-albuterol (DUONEB) 0.5-2.5 (3) MG/3ML SOLN INHALE THREE MLS VIA NEBULIZER EVERY 6 HOURS AS NEEDED (Patient taking differently: Take 3 mLs by nebulization every 6 (six) hours as needed (for wheezing or shortness of breath). ) 360 mL 5  . lactulose (CHRONULAC) 10 GM/15ML solution Take 15-30 mLs (10-20 g total) by mouth 3 (three) times daily. 236 mL 1  . Multiple Vitamins-Minerals (MULTIVITAMIN ADULT PO) Take 1 tablet by mouth daily.    . polyethylene glycol (MIRALAX / GLYCOLAX) packet Take 17 g by mouth daily. 14 each  0  . traMADol (ULTRAM) 50 MG tablet Take 2 tablets (100 mg total) by mouth every 6 (six) hours.  (Patient taking differently: Take 100 mg by mouth every 6 (six) hours as needed for moderate pain or severe pain. ) 30 tablet 0  . vitamin B-12 1000 MCG tablet Take 1 tablet (1,000 mcg total) by mouth daily. 30 tablet 0  . exemestane (AROMASIN) 25 MG tablet Take 1 tablet (25 mg total) by mouth daily after breakfast. (Patient not taking: Reported on 05/15/2018) 90 tablet 1  . fluticasone furoate-vilanterol (BREO ELLIPTA) 100-25 MCG/INH AEPB Inhale 1 puff into the lungs daily. (Patient not taking: Reported on 05/15/2018) 28 each 5  . furosemide (LASIX) 20 MG tablet Take 20 mg by mouth daily as needed for fluid.     No facility-administered medications prior to visit.      Review of Systems  Constitutional: Positive for fever.  HENT: Positive for sinus pressure, sneezing and sore throat. Negative for sinus pain and trouble swallowing.   Respiratory: Positive for cough, shortness of breath and wheezing.   Cardiovascular: Positive for chest pain.  Gastrointestinal: Negative.   Musculoskeletal: Positive for back pain and myalgias.  Neurological: Positive for weakness.       Objective:   Physical Exam Vitals:   05/15/18 1531  BP: 123/74  Pulse: 87  Temp: 98.6 F (37 C)  TempSrc: Oral  SpO2: 100%  Weight: 226 lb (102.5 kg)  Height: 5\' 7"  (1.702 m)    Gen: Pleasant, obese, in no distress,  normal affect  ENT: No lesions,  mouth clear,  oropharynx clear, no postnasal drip, Pale conjunctiva  Neck: No JVD, no TMG, no carotid bruits  Lungs: No use of accessory muscles, no dullness to percussion, few expired wheezes  Cardiovascular: RRR, heart sounds normal, no murmur or gallops, no peripheral edema  Abdomen: soft and NT, no HSM,  BS normal  Musculoskeletal: No deformities, no cyanosis or clubbing  Neuro: alert, non focal  Skin: Warm, no lesions or rashes, very pale BMP Latest Ref Rng & Units 05/06/2018 05/04/2018 05/02/2018  Glucose 70 - 99 mg/dL 98 94 125(H)  BUN 6 - 20 mg/dL 10  10 12   Creatinine 0.44 - 1.00 mg/dL 0.56 0.62 0.78  BUN/Creat Ratio 9 - 23 - - -  Sodium 135 - 145 mmol/L 134(L) 135 132(L)  Potassium 3.5 - 5.1 mmol/L 4.0 4.4 4.2  Chloride 98 - 111 mmol/L 106 109 101  CO2 22 - 32 mmol/L 20(L) 18(L) 22  Calcium 8.9 - 10.3 mg/dL 8.3(L) 8.2(L) 9.4   CBC Latest Ref Rng & Units 05/08/2018 05/07/2018 05/06/2018  WBC 4.0 - 10.5 K/uL 2.9(L) 2.7(L) 2.6(L)  Hemoglobin 12.0 - 15.0 g/dL 3.6(LL) 3.8(LL) 3.8(LL)  Hematocrit 36.0 - 46.0 % 12.4(L) 12.9(L) 13.7(L)  Platelets 150 - 400 K/uL 85(L) 83(L) 92(L)       Assessment & Plan:  I personally reviewed all images and lab data in the [facility] system as well as any outside material available during this office visit and agree with the  radiology impressions.   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  Patient is on chronic Lovenox therapy.  There is not any active bleeding at this time  We will continue Lovenox  Pulmonary embolism [facility]) Refer to thromboembolism assessment   Influenza History of acute influenza slow to resolve  We will not give  flu vaccine at this time Will give cough syrup as needed Have asked the patient to resume Breo inhaler 1 puff daily  Carcinoma of breast metastatic to bone [facility]) Metastatic breast cancer with bone and liver involvement  The patient now is seeing palliative care in the home and will continue the same  Pancytopenia ([place]) Pancytopenia persists  Follow-up with oncology  Symptomatic anemia Ongoing symptomatic anemia and the patient refuses transfusions of blood because she is a Jehovah witness  She will agree to Aranesp infusions and these will continue  Malnutrition of moderate degree Malnutrition of moderate degree  Continue Ensure supplement

## 2018-05-15 NOTE — Assessment & Plan Note (Signed)
Malnutrition of moderate degree  Continue Ensure supplement

## 2018-05-15 NOTE — Assessment & Plan Note (Signed)
Pancytopenia persists  Follow-up with oncology

## 2018-05-15 NOTE — Assessment & Plan Note (Signed)
History of thromboembolic isease now has an inferior vena cava filter in place.  Patient is on chronic Lovenox therapy.  There is not any active bleeding at this time  We will continue Lovenox

## 2018-05-15 NOTE — Assessment & Plan Note (Signed)
Metastatic breast cancer with bone and liver involvement  The patient now is seeing palliative care in the home and will continue the same

## 2018-05-15 NOTE — Assessment & Plan Note (Signed)
History of acute influenza slow to resolve  We will not give flu vaccine at this time Will give cough syrup as needed Have asked the patient to resume Breo inhaler 1 puff daily

## 2018-05-15 NOTE — Assessment & Plan Note (Signed)
Refer to thromboembolism assessment

## 2018-05-15 NOTE — Assessment & Plan Note (Signed)
Ongoing symptomatic anemia and the patient refuses transfusions of blood because she is a Jehovah witness  She will agree to Aranesp infusions and these will continue

## 2018-05-15 NOTE — Progress Notes (Addendum)
[facility] Palliative Care Consult Note Telephone: [phone]  Fax: [phone]  PATIENT NAME: Michaela Little DOB: 03/02/1961 MRN: [id number]  PRIMARY CARE PROVIDER:   Ladell Pier, MD  REFERRING PROVIDER:  Dr. Ky Barban  RESPONSIBLE PARTY:   Extended Emergency Contact Information Primary Emergency Contact: West,[place] Address: [address]          [place], [place] [address] [name] of [place] Phone: [phone] Relation: Daughter Secondary Emergency Contact: Renaldo Reel, [place] [address] [place] Mobile Phone: [phone] Relation: Relative  Palliative Care follows patient in the community by consultation request from Dr. Burr Medico.  ASSESSMENT and RECOMMENDATIONS:  1. Increased metastatic spread: Feels much the same after starting new therapies. Recently in hospital d/t flu. Home and recuperating altho is very fatigued but not ill appearing today. This is likely due to progressive disease and low hgb.   2. Dyspnea. Improved with Rx of PE. Using oxygen PRN, at 2 liters. Does not need at rest although she states her hgb = 3.9 this month.   3. Function/ADL ability: Patient is in contact with  Delana Meyer SW at [facility] for home maker services.  Very much needs adl assistance to remain in her home in the face of decline.  Can ambulate in the home and make herself some meals. Gets meals from friends to warm over.   4. Goals of care:  Again encouraged to name a HCPOA since she has 5 children. She states she had family meeting and can proceed with POA decisions. She also states her intention to continue to treat cancer as possible. MOST form given and POA form which can be notarized and made effective. Will f/u on next visit with MOST form. Consider hospice transition with cessation of life prolonging disease treatment.   Palliative medicine will continue to follow for goals of care and symptom management,  return 3-4 weeks.   I spent 25 minutes providing this consultation,  from 1030 to 1055. More than 50% of the time in this consultation was spent coordinating communication.   HISTORY OF PRESENT ILLNESS:  Michaela Little is a 58 y.o. year old female with multiple medical problems including breast cancer with new mets to bone and liver,h/o DVT,obesity, HTN, HDL, OSA. Palliative Care was asked to help address goals of care.   CODE STATUS: FULL  PPS: 40% HOSPICE ELIGIBILITY/DIAGNOSIS: TBD  PAST MEDICAL HISTORY:  Past Medical History:  Diagnosis Date  . Anxiety   . Arthritis    hands, knees, hips  . Asthma   . Bronchitis   . Cancer ([place])   . Cervical stenosis (uterine cervix)   . Disorder of appendix    Enlarged  . Dyspnea on exertion   . GERD (gastroesophageal reflux disease)   . Headache(784.0)   . History of breast cancer    2010--  LEFT  s/p  mastectomy (in [place]) AND CHEMORADIATION--  NO RECURRENCE  . History of cervical dysplasia   . Hyperlipidemia   . Hypertension   . Malignant neoplasm of overlapping sites of left breast in female, estrogen receptor positive ([place]) 03/05/2013  . OSA (obstructive sleep apnea) moderate osa per study  09/2010   CPAP  , NOT USING ON REGULAR BASIS  . Pelvic pain in female   . Positive H. pylori test    08-05-2013  . Positive TB test    AS TEEN--  [place] W/ MEDS  .  Refusal of blood transfusions as patient is Jehovah's Witness   . Seasonal allergies   . Uterine fibroid   . Wears glasses     SOCIAL HX:  Social History   Tobacco Use  . Smoking status: Former Smoker    Packs/day: 0.30    Years: 10.00    Pack years: 3.00    Types: Cigarettes    Last attempt to quit: 03/26/1988    Years since quitting: 30.1  . Smokeless tobacco: Never Used  Substance Use Topics  . Alcohol use: No    ALLERGIES:  Allergies  Allergen Reactions  . Lisinopril-Hydrochlorothiazide Itching  . Adhesive [Tape] Itching and Other (See Comments)    Redness  .  Fentanyl Other (See Comments)    GI upset and drowsiness. Only to [facility]  . Gabapentin Other (See Comments)    Auditory hallucinations  . Hctz [Hydrochlorothiazide] Itching  . Latex Itching  . Lisinopril Itching  . Losartan Potassium Other (See Comments)    Makes her feel "bad"   . Other Other (See Comments)    Patient refuses blood for religious reasons (per her notes)  . Oxycodone     GI upset, drowsy  . Tums [Calcium Carbonate Antacid] Hives    FRUIT-FLAVORED ONES     PERTINENT MEDICATIONS:  Outpatient Encounter Medications as of 05/15/2018  Medication Sig  . acetaminophen (TYLENOL) 500 MG tablet Take 500 mg by mouth every 6 (six) hours as needed for mild pain.  Marland Kitchen amLODipine (NORVASC) 10 MG tablet Take 1 tablet (10 mg total) by mouth daily.  . cetirizine (ZYRTEC) 10 MG tablet Take 10 mg by mouth daily.  Marland Kitchen docusate sodium (COLACE) 100 MG capsule Take 1 capsule (100 mg total) by mouth daily as needed for mild constipation.  . enoxaparin (LOVENOX) 100 MG/ML injection Inject 1 mL (100 mg total) into the skin every 12 (twelve) hours.  Marland Kitchen exemestane (AROMASIN) 25 MG tablet Take 1 tablet (25 mg total) by mouth daily after breakfast.  . feeding supplement, ENSURE ENLIVE, (ENSURE ENLIVE) LIQD Take 237 mLs by mouth 2 (two) times daily between meals. (Patient taking differently: Take 237 mLs by mouth 2 (two) times daily between meals. Non-dairy Ensure)  . fluticasone (FLONASE) 50 MCG/ACT nasal spray Place 1 spray into both nostrils daily.  . fluticasone furoate-vilanterol (BREO ELLIPTA) 100-25 MCG/INH AEPB Inhale 1 puff into the lungs daily. (Patient taking differently: Inhale 1 puff into the lungs daily as needed (sob and wheezing). )  . folic acid (FOLVITE) 1 MG tablet Take 1 tablet (1 mg total) by mouth daily.  . furosemide (LASIX) 20 MG tablet Take 20 mg by mouth daily as needed for fluid.  . hydrALAZINE (APRESOLINE) 10 MG tablet Take 1 tablet (10 mg total) by mouth 3 (three) times  daily.  Marland Kitchen ipratropium-albuterol (DUONEB) 0.5-2.5 (3) MG/3ML SOLN INHALE THREE MLS VIA NEBULIZER EVERY 6 HOURS AS NEEDED (Patient taking differently: Take 3 mLs by nebulization every 6 (six) hours as needed (for wheezing or shortness of breath). )  . lactulose (CHRONULAC) 10 GM/15ML solution Take 15-30 mLs (10-20 g total) by mouth 3 (three) times daily.  . Multiple Vitamins-Minerals (MULTIVITAMIN ADULT PO) Take 1 tablet by mouth daily.  . polyethylene glycol (MIRALAX / GLYCOLAX) packet Take 17 g by mouth daily.  . traMADol (ULTRAM) 50 MG tablet Take 2 tablets (100 mg total) by mouth every 6 (six) hours. (Patient taking differently: Take 100 mg by mouth every 6 (six) hours as needed for moderate  pain or severe pain. )  . vitamin B-12 1000 MCG tablet Take 1 tablet (1,000 mcg total) by mouth daily.   No facility-administered encounter medications on file as of 05/15/2018.     PHYSICAL EXAM:  Vs [id number] ox 98% at rest  General: frail and ill appearing, obese Cardiovascular: regular rate and rhythm, S1S2 Pulmonary: rales all fields, prod cough Abdomen: soft, nontender, + bowel sounds, fair appetite Extremities: no edema, no joint deformities, no new pain in LE d/t DVT Skin: no rashes or wounds Neurological: Weakness but otherwise nonfocal  B and E, AGPCNP-BC

## 2018-05-15 NOTE — Patient Instructions (Addendum)
Resume the Breo 1 puff daily No other changes in your medications Keep your appointment with oncology A chest x-ray was ordered

## 2018-05-16 ENCOUNTER — Telehealth: Payer: Self-pay

## 2018-05-16 ENCOUNTER — Encounter: Payer: Self-pay | Admitting: Hematology

## 2018-05-16 ENCOUNTER — Ambulatory Visit (HOSPITAL_COMMUNITY)
Admission: RE | Admit: 2018-05-16 | Discharge: 2018-05-16 | Disposition: A | Discharge status: Home / Self Care | Payer: Medicare Other | Source: Ambulatory Visit | Attending: Critical Care Medicine | Admitting: Critical Care Medicine

## 2018-05-16 ENCOUNTER — Telehealth: Payer: Self-pay | Admitting: Hematology

## 2018-05-16 ENCOUNTER — Inpatient Hospital Stay: Payer: Medicare Other

## 2018-05-16 ENCOUNTER — Inpatient Hospital Stay (HOSPITAL_BASED_OUTPATIENT_CLINIC_OR_DEPARTMENT_OTHER): Payer: Medicare Other | Admitting: Hematology

## 2018-05-16 VITALS — BP 138/65 | HR 91 | Temp 97.5°F | Resp 18 | Ht 67.0 in | Wt 225.2 lb

## 2018-05-16 DIAGNOSIS — C50919 Malignant neoplasm of unspecified site of unspecified female breast: Secondary | ICD-10-CM | POA: Diagnosis not present

## 2018-05-16 DIAGNOSIS — Z7189 Other specified counseling: Secondary | ICD-10-CM

## 2018-05-16 DIAGNOSIS — I2699 Other pulmonary embolism without acute cor pulmonale: Secondary | ICD-10-CM | POA: Diagnosis not present

## 2018-05-16 DIAGNOSIS — C785 Secondary malignant neoplasm of large intestine and rectum: Secondary | ICD-10-CM

## 2018-05-16 DIAGNOSIS — R0781 Pleurodynia: Secondary | ICD-10-CM

## 2018-05-16 DIAGNOSIS — Z9981 Dependence on supplemental oxygen: Secondary | ICD-10-CM

## 2018-05-16 DIAGNOSIS — J111 Influenza due to unidentified influenza virus with other respiratory manifestations: Secondary | ICD-10-CM | POA: Diagnosis present

## 2018-05-16 DIAGNOSIS — C7951 Secondary malignant neoplasm of bone: Secondary | ICD-10-CM

## 2018-05-16 DIAGNOSIS — Z170 Estrogen receptor positive status [ER+]: Secondary | ICD-10-CM

## 2018-05-16 DIAGNOSIS — Z5111 Encounter for antineoplastic chemotherapy: Secondary | ICD-10-CM | POA: Diagnosis present

## 2018-05-16 DIAGNOSIS — R05 Cough: Secondary | ICD-10-CM

## 2018-05-16 DIAGNOSIS — D649 Anemia, unspecified: Secondary | ICD-10-CM

## 2018-05-16 DIAGNOSIS — J45909 Unspecified asthma, uncomplicated: Secondary | ICD-10-CM | POA: Diagnosis present

## 2018-05-16 DIAGNOSIS — I82401 Acute embolism and thrombosis of unspecified deep veins of right lower extremity: Secondary | ICD-10-CM | POA: Diagnosis not present

## 2018-05-16 DIAGNOSIS — I824Y1 Acute embolism and thrombosis of unspecified deep veins of right proximal lower extremity: Secondary | ICD-10-CM

## 2018-05-16 DIAGNOSIS — C50812 Malignant neoplasm of overlapping sites of left female breast: Secondary | ICD-10-CM

## 2018-05-16 DIAGNOSIS — C50912 Malignant neoplasm of unspecified site of left female breast: Secondary | ICD-10-CM

## 2018-05-16 LAB — CMP (CANCER CENTER ONLY)
ALT: 8 U/L (ref 0–44)
AST: 23 U/L (ref 15–41)
Albumin: 3.1 g/dL — ABNORMAL LOW (ref 3.5–5.0)
Alkaline Phosphatase: 141 U/L — ABNORMAL HIGH (ref 38–126)
Anion gap: 10 (ref 5–15)
BUN: 11 mg/dL (ref 6–20)
CALCIUM: 10 mg/dL (ref 8.9–10.3)
CO2: 25 mmol/L (ref 22–32)
Chloride: 106 mmol/L (ref 98–111)
Creatinine: 0.78 mg/dL (ref 0.44–1.00)
GFR, Est AFR Am: >60 mL/min (ref >60)
GFR, Estimated: >60 mL/min (ref >60)
Glucose, Bld: 107 mg/dL — ABNORMAL HIGH (ref 70–99)
Potassium: 4.3 mmol/L (ref 3.5–5.1)
Sodium: 141 mmol/L (ref 135–145)
Total Bilirubin: 1.2 mg/dL (ref 0.3–1.2)
Total Protein: 7.2 g/dL (ref 6.5–8.1)

## 2018-05-16 LAB — CBC WITH DIFFERENTIAL/PLATELET
Abs Immature Granulocytes: 0.14 10*3/uL — ABNORMAL HIGH (ref 0.00–0.07)
Basophils Absolute: 0 10*3/uL (ref 0.0–0.1)
Basophils Relative: 0 %
Eosinophils Absolute: 0.1 10*3/uL (ref 0.0–0.5)
Eosinophils Relative: 2 %
HCT: 14.6 % — ABNORMAL LOW (ref 36.0–46.0)
Hemoglobin: 4.2 g/dL — CL (ref 12.0–15.0)
IMMATURE GRANULOCYTES: 5 %
Lymphocytes Relative: 29 %
Lymphs Abs: 0.9 10*3/uL (ref 0.7–4.0)
MCH: 28.6 pg (ref 26.0–34.0)
MCHC: 28.8 g/dL — ABNORMAL LOW (ref 30.0–36.0)
MCV: 99.3 fL (ref 80.0–100.0)
Monocytes Absolute: 0.3 10*3/uL (ref 0.1–1.0)
Monocytes Relative: 11 %
Neutro Abs: 1.6 10*3/uL — ABNORMAL LOW (ref 1.7–7.7)
Neutrophils Relative %: 53 %
Platelets: 105 10*3/uL — ABNORMAL LOW (ref 150–400)
RBC: 1.47 MIL/uL — ABNORMAL LOW (ref 3.87–5.11)
RDW: 20.6 % — ABNORMAL HIGH (ref 11.5–15.5)
WBC: 3 10*3/uL — AB (ref 4.0–10.5)
nRBC: 8.3 % — ABNORMAL HIGH (ref 0.0–0.2)

## 2018-05-16 MED ORDER — FULVESTRANT 250 MG/5ML IM SOLN
INTRAMUSCULAR | Status: AC
  Filled 2018-05-16: qty 10

## 2018-05-16 MED ORDER — DARBEPOETIN ALFA 500 MCG/ML IJ SOSY
PREFILLED_SYRINGE | INTRAMUSCULAR | Status: AC
  Filled 2018-05-16: qty 1

## 2018-05-16 MED ORDER — FOLIC ACID 1 MG PO TABS: 1.0000 mg | ORAL_TABLET | Freq: Every day | ORAL | 1 refills | Status: DC

## 2018-05-16 MED ORDER — DARBEPOETIN ALFA 500 MCG/ML IJ SOSY
500.0000 ug | PREFILLED_SYRINGE | Freq: Once | INTRAMUSCULAR | Status: AC
  Administered 2018-05-16: 500 ug via SUBCUTANEOUS

## 2018-05-16 MED ORDER — FULVESTRANT 250 MG/5ML IM SOLN
500.0000 mg | Freq: Once | INTRAMUSCULAR | Status: AC
  Administered 2018-05-16: 500 mg via INTRAMUSCULAR

## 2018-05-16 NOTE — Telephone Encounter (Signed)
Faxed signed order for Nursing home [facility] back to [place], sent to HIM for scanning to chart.

## 2018-05-16 NOTE — Telephone Encounter (Signed)
Scheduled appt per 02/21 los.  Printed calendar and avs.

## 2018-05-21 ENCOUNTER — Telehealth: Payer: Self-pay | Admitting: Critical Care Medicine

## 2018-05-21 DIAGNOSIS — R059 Cough, unspecified: Secondary | ICD-10-CM

## 2018-05-21 DIAGNOSIS — R05 Cough: Secondary | ICD-10-CM

## 2018-05-21 MED ORDER — PREDNISONE 10 MG PO TABS: ORAL_TABLET | ORAL | 0 refills | Status: DC

## 2018-05-21 MED ORDER — FLUTICASONE FUROATE-VILANTEROL 100-25 MCG/INH IN AEPB: 1.0000 | INHALATION_SPRAY | Freq: Every day | RESPIRATORY_TRACT | 5 refills | Status: DC

## 2018-05-21 NOTE — Telephone Encounter (Signed)
CXR still abn s/p flu  rx pred pulse and breo refilled

## 2018-05-26 ENCOUNTER — Telehealth: Payer: Self-pay | Admitting: *Deleted

## 2018-05-26 NOTE — Telephone Encounter (Signed)
-----   Message from Elsie Stain, MD sent at 05/20/2018  9:07 AM EST ----- [place], pls let ms Dahle know her cxr is still abnormal.  See if she is still couging.  She may need a course of prednisone and also more ABX   Perhaps show to dr Wynetta Emery.  I will be back tomorrow

## 2018-05-26 NOTE — Telephone Encounter (Signed)
Medical Assistant left message on patient's home and cell voicemail. Voicemail states to give a call back to Digby Groeneveld with [facility] at [phone].  

## 2018-05-30 ENCOUNTER — Inpatient Hospital Stay: Payer: Medicare Other | Attending: Hematology

## 2018-05-30 ENCOUNTER — Other Ambulatory Visit: Payer: Self-pay

## 2018-05-30 ENCOUNTER — Emergency Department (HOSPITAL_COMMUNITY): Payer: Medicare Other

## 2018-05-30 ENCOUNTER — Inpatient Hospital Stay: Payer: Medicare Other

## 2018-05-30 ENCOUNTER — Inpatient Hospital Stay (HOSPITAL_COMMUNITY)
Admission: EM | Admit: 2018-05-30 | Discharge: 2018-06-06 | DRG: 309 | Disposition: A | Discharge status: Skilled Nursing Facility | Payer: Medicare Other | Attending: Internal Medicine | Admitting: Internal Medicine

## 2018-05-30 ENCOUNTER — Inpatient Hospital Stay: Payer: Medicare Other | Admitting: Hematology

## 2018-05-30 ENCOUNTER — Encounter (HOSPITAL_COMMUNITY): Payer: Self-pay | Admitting: Emergency Medicine

## 2018-05-30 DIAGNOSIS — I480 Paroxysmal atrial fibrillation: Principal | ICD-10-CM | POA: Diagnosis present

## 2018-05-30 DIAGNOSIS — E785 Hyperlipidemia, unspecified: Secondary | ICD-10-CM | POA: Diagnosis present

## 2018-05-30 DIAGNOSIS — Z8261 Family history of arthritis: Secondary | ICD-10-CM | POA: Diagnosis not present

## 2018-05-30 DIAGNOSIS — Z888 Allergy status to other drugs, medicaments and biological substances status: Secondary | ICD-10-CM

## 2018-05-30 DIAGNOSIS — K5900 Constipation, unspecified: Secondary | ICD-10-CM | POA: Diagnosis not present

## 2018-05-30 DIAGNOSIS — IMO0001 Reserved for inherently not codable concepts without codable children: Secondary | ICD-10-CM

## 2018-05-30 DIAGNOSIS — Z800 Family history of malignant neoplasm of digestive organs: Secondary | ICD-10-CM

## 2018-05-30 DIAGNOSIS — J45909 Unspecified asthma, uncomplicated: Secondary | ICD-10-CM | POA: Diagnosis present

## 2018-05-30 DIAGNOSIS — I10 Essential (primary) hypertension: Secondary | ICD-10-CM | POA: Diagnosis present

## 2018-05-30 DIAGNOSIS — Z531 Procedure and treatment not carried out because of patient's decision for reasons of belief and group pressure: Secondary | ICD-10-CM | POA: Diagnosis present

## 2018-05-30 DIAGNOSIS — Z170 Estrogen receptor positive status [ER+]: Secondary | ICD-10-CM

## 2018-05-30 DIAGNOSIS — Z86718 Personal history of other venous thrombosis and embolism: Secondary | ICD-10-CM

## 2018-05-30 DIAGNOSIS — Z85118 Personal history of other malignant neoplasm of bronchus and lung: Secondary | ICD-10-CM | POA: Diagnosis not present

## 2018-05-30 DIAGNOSIS — Z7901 Long term (current) use of anticoagulants: Secondary | ICD-10-CM | POA: Diagnosis not present

## 2018-05-30 DIAGNOSIS — R402142 Coma scale, eyes open, spontaneous, at arrival to emergency department: Secondary | ICD-10-CM | POA: Diagnosis present

## 2018-05-30 DIAGNOSIS — Z9981 Dependence on supplemental oxygen: Secondary | ICD-10-CM

## 2018-05-30 DIAGNOSIS — R402362 Coma scale, best motor response, obeys commands, at arrival to emergency department: Secondary | ICD-10-CM | POA: Diagnosis present

## 2018-05-30 DIAGNOSIS — I4891 Unspecified atrial fibrillation: Secondary | ICD-10-CM | POA: Diagnosis not present

## 2018-05-30 DIAGNOSIS — R112 Nausea with vomiting, unspecified: Secondary | ICD-10-CM | POA: Diagnosis not present

## 2018-05-30 DIAGNOSIS — Z832 Family history of diseases of the blood and blood-forming organs and certain disorders involving the immune mechanism: Secondary | ICD-10-CM

## 2018-05-30 DIAGNOSIS — Z95828 Presence of other vascular implants and grafts: Secondary | ICD-10-CM

## 2018-05-30 DIAGNOSIS — Z833 Family history of diabetes mellitus: Secondary | ICD-10-CM | POA: Diagnosis not present

## 2018-05-30 DIAGNOSIS — Z515 Encounter for palliative care: Secondary | ICD-10-CM

## 2018-05-30 DIAGNOSIS — Z818 Family history of other mental and behavioral disorders: Secondary | ICD-10-CM | POA: Diagnosis not present

## 2018-05-30 DIAGNOSIS — Z91040 Latex allergy status: Secondary | ICD-10-CM

## 2018-05-30 DIAGNOSIS — C7951 Secondary malignant neoplasm of bone: Secondary | ICD-10-CM | POA: Diagnosis present

## 2018-05-30 DIAGNOSIS — R9431 Abnormal electrocardiogram [ECG] [EKG]: Secondary | ICD-10-CM | POA: Diagnosis not present

## 2018-05-30 DIAGNOSIS — Z803 Family history of malignant neoplasm of breast: Secondary | ICD-10-CM | POA: Diagnosis not present

## 2018-05-30 DIAGNOSIS — Z86711 Personal history of pulmonary embolism: Secondary | ICD-10-CM

## 2018-05-30 DIAGNOSIS — Z853 Personal history of malignant neoplasm of breast: Secondary | ICD-10-CM

## 2018-05-30 DIAGNOSIS — C50812 Malignant neoplasm of overlapping sites of left female breast: Secondary | ICD-10-CM

## 2018-05-30 DIAGNOSIS — R402252 Coma scale, best verbal response, oriented, at arrival to emergency department: Secondary | ICD-10-CM | POA: Diagnosis present

## 2018-05-30 DIAGNOSIS — D649 Anemia, unspecified: Secondary | ICD-10-CM | POA: Insufficient documentation

## 2018-05-30 DIAGNOSIS — Z885 Allergy status to narcotic agent status: Secondary | ICD-10-CM

## 2018-05-30 DIAGNOSIS — K219 Gastro-esophageal reflux disease without esophagitis: Secondary | ICD-10-CM | POA: Diagnosis present

## 2018-05-30 DIAGNOSIS — Z7951 Long term (current) use of inhaled steroids: Secondary | ICD-10-CM

## 2018-05-30 DIAGNOSIS — Z5111 Encounter for antineoplastic chemotherapy: Secondary | ICD-10-CM | POA: Insufficient documentation

## 2018-05-30 DIAGNOSIS — G4733 Obstructive sleep apnea (adult) (pediatric): Secondary | ICD-10-CM | POA: Diagnosis present

## 2018-05-30 DIAGNOSIS — Z87891 Personal history of nicotine dependence: Secondary | ICD-10-CM | POA: Diagnosis not present

## 2018-05-30 DIAGNOSIS — R0602 Shortness of breath: Secondary | ICD-10-CM

## 2018-05-30 DIAGNOSIS — Z825 Family history of asthma and other chronic lower respiratory diseases: Secondary | ICD-10-CM

## 2018-05-30 DIAGNOSIS — Z8249 Family history of ischemic heart disease and other diseases of the circulatory system: Secondary | ICD-10-CM | POA: Diagnosis not present

## 2018-05-30 DIAGNOSIS — J9611 Chronic respiratory failure with hypoxia: Secondary | ICD-10-CM | POA: Diagnosis present

## 2018-05-30 DIAGNOSIS — I82402 Acute embolism and thrombosis of unspecified deep veins of left lower extremity: Secondary | ICD-10-CM | POA: Diagnosis not present

## 2018-05-30 DIAGNOSIS — C787 Secondary malignant neoplasm of liver and intrahepatic bile duct: Secondary | ICD-10-CM | POA: Diagnosis present

## 2018-05-30 DIAGNOSIS — R0781 Pleurodynia: Secondary | ICD-10-CM

## 2018-05-30 DIAGNOSIS — Z9012 Acquired absence of left breast and nipple: Secondary | ICD-10-CM | POA: Diagnosis not present

## 2018-05-30 DIAGNOSIS — I2699 Other pulmonary embolism without acute cor pulmonale: Secondary | ICD-10-CM | POA: Diagnosis present

## 2018-05-30 LAB — CBC WITH DIFFERENTIAL/PLATELET
Abs Immature Granulocytes: 0.31 10*3/uL — ABNORMAL HIGH (ref 0.00–0.07)
Basophils Absolute: 0.1 10*3/uL (ref 0.0–0.1)
Basophils Relative: 1 %
EOS ABS: 0.1 10*3/uL (ref 0.0–0.5)
Eosinophils Relative: 2 %
HCT: 18.6 % — ABNORMAL LOW (ref 36.0–46.0)
Hemoglobin: 5.2 g/dL — CL (ref 12.0–15.0)
Immature Granulocytes: 5 %
Lymphocytes Relative: 41 %
Lymphs Abs: 2.8 10*3/uL (ref 0.7–4.0)
MCH: 28 pg (ref 26.0–34.0)
MCHC: 28 g/dL — ABNORMAL LOW (ref 30.0–36.0)
MCV: 100 fL (ref 80.0–100.0)
Monocytes Absolute: 0.7 10*3/uL (ref 0.1–1.0)
Monocytes Relative: 10 %
Neutro Abs: 2.8 10*3/uL (ref 1.7–7.7)
Neutrophils Relative %: 41 %
Platelets: 135 10*3/uL — ABNORMAL LOW (ref 150–400)
RBC: 1.86 MIL/uL — ABNORMAL LOW (ref 3.87–5.11)
RDW: 20.8 % — ABNORMAL HIGH (ref 11.5–15.5)
WBC: 6.7 10*3/uL (ref 4.0–10.5)
nRBC: 0 % (ref 0.0–0.2)

## 2018-05-30 LAB — COMPREHENSIVE METABOLIC PANEL
ALT: 17 U/L (ref 0–44)
AST: 31 U/L (ref 15–41)
Albumin: 3.2 g/dL — ABNORMAL LOW (ref 3.5–5.0)
Alkaline Phosphatase: 127 U/L — ABNORMAL HIGH (ref 38–126)
Anion gap: 11 (ref 5–15)
BUN: 14 mg/dL (ref 6–20)
CO2: 21 mmol/L — AB (ref 22–32)
Calcium: 12.8 mg/dL — ABNORMAL HIGH (ref 8.9–10.3)
Chloride: 105 mmol/L (ref 98–111)
Creatinine, Ser: 0.97 mg/dL (ref 0.44–1.00)
GFR calc Af Amer: >60 mL/min (ref >60)
GFR calc non Af Amer: >60 mL/min (ref >60)
Glucose, Bld: 146 mg/dL — ABNORMAL HIGH (ref 70–99)
Potassium: 3.1 mmol/L — ABNORMAL LOW (ref 3.5–5.1)
SODIUM: 137 mmol/L (ref 135–145)
Total Bilirubin: 1.1 mg/dL (ref 0.3–1.2)
Total Protein: 7 g/dL (ref 6.5–8.1)

## 2018-05-30 LAB — I-STAT BETA HCG BLOOD, ED (MC, WL, AP ONLY)
I-stat hCG, quantitative: <5 m[IU]/mL (ref <5)
I-stat hCG, quantitative: <5 m[IU]/mL (ref <5)

## 2018-05-30 LAB — APTT: aPTT: 21 seconds — ABNORMAL LOW (ref 24–36)

## 2018-05-30 LAB — TROPONIN I: Troponin I: <0.03 ng/mL (ref <0.03)

## 2018-05-30 MED ORDER — ACETAMINOPHEN 500 MG PO TABS
500.0000 mg | ORAL_TABLET | Freq: Four times a day (QID) | ORAL | Status: DC | PRN
  Filled 2018-05-30: qty 1

## 2018-05-30 MED ORDER — FUROSEMIDE 20 MG PO TABS: 20.0000 mg | ORAL_TABLET | Freq: Every day | ORAL | Status: DC | PRN

## 2018-05-30 MED ORDER — POTASSIUM CHLORIDE CRYS ER 20 MEQ PO TBCR
40.0000 meq | EXTENDED_RELEASE_TABLET | Freq: Once | ORAL | Status: AC
  Administered 2018-05-30: 40 meq via ORAL
  Filled 2018-05-30: qty 2

## 2018-05-30 MED ORDER — IBUPROFEN 200 MG PO TABS
400.0000 mg | ORAL_TABLET | Freq: Four times a day (QID) | ORAL | Status: DC | PRN
  Filled 2018-05-30: qty 2

## 2018-05-30 MED ORDER — DILTIAZEM HCL-DEXTROSE 100-5 MG/100ML-% IV SOLN (PREMIX)
5.0000 mg/h | INTRAVENOUS | Status: DC
  Administered 2018-05-30 (×2): 15 mg/h via INTRAVENOUS
  Administered 2018-05-31: 12.5 mg/h via INTRAVENOUS
  Filled 2018-05-30 (×3): qty 100

## 2018-05-30 MED ORDER — HYDRALAZINE HCL 10 MG PO TABS
10.0000 mg | ORAL_TABLET | Freq: Three times a day (TID) | ORAL | Status: DC
  Administered 2018-05-30 – 2018-06-06 (×19): 10 mg via ORAL
  Filled 2018-05-30 (×20): qty 1

## 2018-05-30 MED ORDER — FLUTICASONE FUROATE-VILANTEROL 100-25 MCG/INH IN AEPB
1.0000 | INHALATION_SPRAY | Freq: Every day | RESPIRATORY_TRACT | Status: DC
  Administered 2018-05-30: 1 via RESPIRATORY_TRACT
  Filled 2018-05-30: qty 28

## 2018-05-30 MED ORDER — ACETAMINOPHEN 650 MG RE SUPP: 650.0000 mg | Freq: Four times a day (QID) | RECTAL | Status: DC | PRN

## 2018-05-30 MED ORDER — ENOXAPARIN SODIUM 120 MG/0.8ML ~~LOC~~ SOLN
100.0000 mg | Freq: Two times a day (BID) | SUBCUTANEOUS | Status: DC
  Administered 2018-05-30 – 2018-06-01 (×3): 100 mg via SUBCUTANEOUS
  Filled 2018-05-30 (×6): qty 0.67

## 2018-05-30 MED ORDER — DOCUSATE SODIUM 100 MG PO CAPS: 200.0000 mg | ORAL_CAPSULE | ORAL | Status: DC

## 2018-05-30 MED ORDER — ADULT MULTIVITAMIN W/MINERALS CH
1.0000 | ORAL_TABLET | ORAL | Status: DC
  Administered 2018-05-30 – 2018-06-06 (×3): 1 via ORAL
  Filled 2018-05-30 (×6): qty 1

## 2018-05-30 MED ORDER — GERITOL COMPLETE PO TABS: 1.0000 | ORAL_TABLET | ORAL | Status: DC

## 2018-05-30 MED ORDER — IPRATROPIUM-ALBUTEROL 0.5-2.5 (3) MG/3ML IN SOLN: 3.0000 mL | Freq: Four times a day (QID) | RESPIRATORY_TRACT | Status: DC | PRN

## 2018-05-30 MED ORDER — DILTIAZEM LOAD VIA INFUSION
20.0000 mg | Freq: Once | INTRAVENOUS | Status: AC
  Administered 2018-05-30: 20 mg via INTRAVENOUS
  Filled 2018-05-30: qty 20

## 2018-05-30 MED ORDER — FOLIC ACID 1 MG PO TABS
1.0000 mg | ORAL_TABLET | Freq: Every day | ORAL | Status: DC
  Administered 2018-05-31 – 2018-06-06 (×6): 1 mg via ORAL
  Filled 2018-05-30 (×6): qty 1

## 2018-05-30 MED ORDER — FLUTICASONE PROPIONATE 50 MCG/ACT NA SUSP
1.0000 | Freq: Every day | NASAL | Status: DC
  Administered 2018-06-02 – 2018-06-05 (×2): 1 via NASAL
  Filled 2018-05-30: qty 16

## 2018-05-30 MED ORDER — SODIUM CHLORIDE 0.9 % IV SOLN
INTRAVENOUS | Status: DC
  Administered 2018-05-30 – 2018-06-03 (×9): via INTRAVENOUS

## 2018-05-30 MED ORDER — ACETAMINOPHEN 325 MG PO TABS: 650.0000 mg | ORAL_TABLET | Freq: Four times a day (QID) | ORAL | Status: DC | PRN

## 2018-05-30 MED ORDER — ALBUTEROL SULFATE (2.5 MG/3ML) 0.083% IN NEBU: 2.5000 mg | INHALATION_SOLUTION | RESPIRATORY_TRACT | Status: DC | PRN

## 2018-05-30 MED ORDER — TRAMADOL HCL 50 MG PO TABS
50.0000 mg | ORAL_TABLET | Freq: Four times a day (QID) | ORAL | Status: DC | PRN
  Administered 2018-05-30 – 2018-06-03 (×7): 50 mg via ORAL
  Filled 2018-05-30 (×8): qty 1

## 2018-05-30 MED ORDER — CARVEDILOL 12.5 MG PO TABS
12.5000 mg | ORAL_TABLET | Freq: Two times a day (BID) | ORAL | Status: DC
  Administered 2018-05-30 – 2018-06-06 (×14): 12.5 mg via ORAL
  Filled 2018-05-30 (×14): qty 1

## 2018-05-30 MED ORDER — VITAMIN B-12 1000 MCG PO TABS: 1000.0000 ug | ORAL_TABLET | Freq: Every day | ORAL | Status: DC | PRN

## 2018-05-30 MED ORDER — POLYETHYLENE GLYCOL 3350 17 G PO PACK: 17.0000 g | PACK | Freq: Every day | ORAL | Status: DC | PRN

## 2018-05-30 MED ORDER — SODIUM CHLORIDE 0.9 % IV BOLUS
500.0000 mL | Freq: Once | INTRAVENOUS | Status: AC
  Administered 2018-05-30: 500 mL via INTRAVENOUS

## 2018-05-30 MED ORDER — ENSURE ENLIVE PO LIQD
237.0000 mL | Freq: Three times a day (TID) | ORAL | Status: DC
  Administered 2018-05-30 – 2018-06-06 (×13): 237 mL via ORAL

## 2018-05-30 NOTE — ED Notes (Signed)
Pt currently denies CP

## 2018-05-30 NOTE — ED Notes (Addendum)
ED TO INPATIENT HANDOFF REPORT  ED Nurse Name and Phone #: Myriam Jacobson [phone]  S Name/Age/Gender Michaela Little 58 y.o. female Room/Bed: 022C/022C  Code Status   Code Status: Prior  Home/SNF/Other Home Patient oriented to: self, place, time and situation Is this baseline? Yes   Triage Complete: Triage complete  Chief Complaint cp  Triage Note Pt arrives via EMS from home with CP that began about an hour ago, substernal chest pain nonradiating, with SOB, wears O2 at 2L at baseline. Pt pale, last hgb 4.2, diaphoretic. Alert, oriented x4. Last bp 108/? HR 148-172 afib RVR. Pt given 324mg  ASA, 2SL with pain from 8/10 to 6/10. Wants to refuse blood products as if Jehovah Witness.    Allergies Allergies  Allergen Reactions  . Lisinopril-Hydrochlorothiazide Itching  . Adhesive [Tape] Itching and Other (See Comments)    Redness  . Fentanyl Other (See Comments)    GI upset and drowsiness. Only to [facility]  . Gabapentin Other (See Comments)    Auditory hallucinations  . Hctz [Hydrochlorothiazide] Itching  . Latex Itching  . Lisinopril Itching  . Losartan Potassium Other (See Comments)    Makes her feel "bad"   . Other Other (See Comments)    Patient refuses blood for religious reasons (per her notes)  . Oxycodone     GI upset, drowsy  . Tums [Calcium Carbonate Antacid] Hives    FRUIT-FLAVORED ONES    Level of Care/Admitting Diagnosis ED Disposition    ED Disposition Condition [facility]: [place] [[id number]]  Level of Care: Cardiac Telemetry [103]  Diagnosis: New onset a-fib [facility]) [id number]  Admitting Physician: Barb Merino [[id number]]  Attending Physician: Barb Merino [[id number]]  Estimated length of stay: past midnight tomorrow  Certification:: I certify this patient will need inpatient services for at least 2 midnights  PT Class (Do Not Modify): Inpatient [101]  PT Acc Code (Do Not Modify): Private [1]        B Medical/Surgery History Past Medical History:  Diagnosis Date  . Anxiety   . Arthritis    hands, knees, hips  . Asthma   . Bronchitis   . Cancer ([place])   . Cervical stenosis (uterine cervix)   . Disorder of appendix    Enlarged  . Dyspnea on exertion   . GERD (gastroesophageal reflux disease)   . Headache(784.0)   . History of breast cancer    2010--  LEFT  s/p  mastectomy (in [place]) AND CHEMORADIATION--  NO RECURRENCE  . History of cervical dysplasia   . Hyperlipidemia   . Hypertension   . Malignant neoplasm of overlapping sites of left breast in female, estrogen receptor positive ([place]) 03/05/2013  . OSA (obstructive sleep apnea) moderate osa per study  09/2010   CPAP  , NOT USING ON REGULAR BASIS  . Pelvic pain in female   . Positive H. pylori test    08-05-2013  . Positive TB test    AS TEEN--  [place] W/ MEDS  . Refusal of blood transfusions as patient is Jehovah's Witness   . Seasonal allergies   . Uterine fibroid   . Wears glasses    Past Surgical History:  Procedure Laterality Date  . APPENDECTOMY  y  . CARDIOVASCULAR STRESS TEST  10-29-2012   low risk perfusion study/  no significant reversibity/ ef 66%/  normal wall motion  . CERVICAL CONIZATION W/BX  2012   in  [place]  05/14/2012   Procedure: LAPAROSCOPIC CHOLECYSTECTOMY;  Surgeon: Ralene Ok, MD;  Location: [facility];  Service: General;  Laterality: N/A;  . COLONOSCOPY  2011   normal per patient - [place]  . DILATION AND CURETTAGE OF UTERUS  10/15/2011   Procedure: DILATATION AND CURETTAGE;  Surgeon: Melina Schools, MD;  Location: [facility];  Service: Gynecology;  Laterality: N/A;  Conization &  endocervical curettings  . EXAMINATION UNDER ANESTHESIA N/A 08/20/2013   Procedure: EXAM UNDER ANESTHESIA;  Surgeon: Margarette Asal, MD;  Location: [facility];  Service: Gynecology;  Laterality: N/A;  . [place]   right  . IR IVC FILTER PLMT / S&I /IMG GUID/MOD SED  03/01/2018  .  IR RADIOLOGIST EVAL & MGMT  04/08/2018  . LAPAROSCOPIC APPENDECTOMY N/A 08/22/2017   Procedure: [place];  Surgeon: Coralie Keens, MD;  Location: [facility];  Service: General;  Laterality: N/A;  . LAPAROSCOPIC ASSISTED VAGINAL HYSTERECTOMY N/A 10/26/2014   Procedure: HYSTERECTOMY ABDOMINAL ;  Surgeon: Molli Posey, MD;  Location: [facility];  Service: Gynecology;  Laterality: N/A;  . MASTECTOMY Left 11/2008  in [place]   . SALPINGOOPHORECTOMY Bilateral 10/26/2014   Procedure: BILATERAL SALPINGO OOPHORECTOMY;  Surgeon: Molli Posey, MD;  Location: [facility];  Service: Gynecology;  Laterality: Bilateral;  . TISSUE EXPANDER PLACEMENT Left 08/22/2017   Procedure: REMOVAL OF LEFT TISSUE EXPANDER;  Surgeon: Crissie Reese, MD;  Location: [facility];  Service: Plastics;  Laterality: Left;  . TISSUE EXPANDER REMOVAL Left 08/22/2017  . TRANSTHORACIC ECHOCARDIOGRAM  10-29-2012   mild lvh/  ef 60-65%/  grade II diastolic dysfunction/  trivial mr  &  tr     A IV Location/Drains/Wounds Patient Lines/Drains/Airways Status   Active Line/Drains/Airways    Name:   Placement date:   Placement time:   Site:   Days:   Peripheral IV 05/30/18 Right Antecubital   05/30/18    1300    Antecubital   less than 1   External Urinary Catheter   05/30/18    1515    -   less than 1          Intake/Output Last 24 hours  Intake/Output Summary (Last 24 hours) at 05/30/2018 1609 Last data filed at 05/30/2018 1609 Gross per 24 hour  Intake 500 ml  Output -  Net 500 ml    Labs/Imaging Results for orders placed or performed during the hospital encounter of 05/30/18 (from the past 48 hour(s))  CBC with Differential/Platelet     Status: Abnormal   Collection Time: 05/30/18  1:08 PM  Result Value Ref Range   WBC 6.7 4.0 - 10.5 K/uL   RBC 1.86 (L) 3.87 - 5.11 MIL/uL   Hemoglobin 5.2 (LL) 12.0 - 15.0 g/dL    Comment: This critical result has verified and been called to RN MICKY KOHUG by  Messan Houegnifio on 03 06 2020 at 1348, and has been read back.  REPEATED TO VERIFY CORRECTED ON 03/06 AT 1540: PREVIOUSLY REPORTED AS 5.2 This critical result has verified and been called to RN MICKY KOHUG by Messan Houegnifio on 03 06 2020 at 1348, and has been read back.     HCT 18.6 (L) 36.0 - 46.0 %   MCV 100.0 80.0 - 100.0 fL   MCH 28.0 26.0 - 34.0 pg   MCHC 28.0 (L) 30.0 - 36.0 g/dL   RDW 20.8 (H) 11.5 - 15.5 %   Platelets  135 (L) 150 - 400 K/uL   nRBC 0.0 0.0 - 0.2 %   Neutrophils Relative % 41 %   Neutro Abs 2.8 1.7 - 7.7 K/uL   Lymphocytes Relative 41 %   Lymphs Abs 2.8 0.7 - 4.0 K/uL   Monocytes Relative 10 %   Monocytes Absolute 0.7 0.1 - 1.0 K/uL   Eosinophils Relative 2 %   Eosinophils Absolute 0.1 0.0 - 0.5 K/uL   Basophils Relative 1 %   Basophils Absolute 0.1 0.0 - 0.1 K/uL   Immature Granulocytes 5 %   Abs Immature Granulocytes 0.31 (H) 0.00 - 0.07 K/uL    Comment: Performed at [facility] [address], [place], [place] [address]  Comprehensive metabolic panel     Status: Abnormal   Collection Time: 05/30/18  1:08 PM  Result Value Ref Range   Sodium 137 135 - 145 mmol/L   Potassium 3.1 (L) 3.5 - 5.1 mmol/L   Chloride 105 98 - 111 mmol/L   CO2 21 (L) 22 - 32 mmol/L   Glucose, Bld 146 (H) 70 - 99 mg/dL   BUN 14 6 - 20 mg/dL   Creatinine, Ser 0.97 0.44 - 1.00 mg/dL   Calcium 12.8 (H) 8.9 - 10.3 mg/dL   Total Protein 7.0 6.5 - 8.1 g/dL   Albumin 3.2 (L) 3.5 - 5.0 g/dL   AST 31 15 - 41 U/L   ALT 17 0 - 44 U/L   Alkaline Phosphatase 127 (H) 38 - 126 U/L   Total Bilirubin 1.1 0.3 - 1.2 mg/dL   GFR calc non Af Amer >60 >60 mL/min   GFR calc Af Amer >60 >60 mL/min   Anion gap 11 5 - 15    Comment: Performed at [facility], [place] [address], [place], [place] [address]  APTT     Status: Abnormal   Collection Time: 05/30/18  1:08 PM  Result Value Ref Range   aPTT 21 (L) 24 - 36 seconds    Comment: Performed at [facility] [address] [place], [place] [address]  I-Stat beta hCG blood, ED ([facility], [facility], [facility] only)     Status: None   Collection Time: 05/30/18  1:36 PM  Result Value Ref Range   I-stat hCG, quantitative <5.0 <5 mIU/mL   Comment 3            Comment:   GEST. AGE      CONC.  (mIU/mL)   <=1 WEEK        5 - 50     2 WEEKS       50 - 500     3 WEEKS       100 - 10,000     4 WEEKS     1,000 - 30,000        FEMALE AND NON-PREGNANT FEMALE:     LESS THAN 5 mIU/mL   I-Stat beta hCG blood, ED ([facility], [facility], [facility] only)     Status: None   Collection Time: 05/30/18  1:37 PM  Result Value Ref Range   I-stat hCG, quantitative <5.0 <5 mIU/mL   Comment 3            Comment:   GEST. AGE      CONC.  (mIU/mL)   <=1 WEEK        5 - 50     2 WEEKS       50 - 500  3 WEEKS       100 - 10,000     4 WEEKS     1,000 - 30,000        FEMALE AND NON-PREGNANT FEMALE:     LESS THAN 5 mIU/mL    *Note: Due to a large number of results and/or encounters for the requested time period, some results have not been displayed. A complete set of results can be found in Results Review.   Dg Chest 2 View  Result Date: 05/30/2018 CLINICAL DATA:  Dyspnea, chest pain EXAM: CHEST - 2 VIEW COMPARISON:  05/16/2018 chest radiograph. FINDINGS: Surgical clips overlie the left axilla. Stable cardiomediastinal silhouette with heart size. No pneumothorax. No pleural effusion. No overt pulmonary edema. No acute consolidative airspace disease. Stable widespread sclerotic osseous metastatic disease. IMPRESSION: No active cardiopulmonary disease. 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 Electronically Signed   By: Ilona Sorrel M.D.   On: 05/30/2018 14:21    Pending Labs [employer] (From admission, onward)    Start     Ordered   Signed and Occupational hygienist morning,   R     Signed and Held   Signed and Held  CBC  Tomorrow morning,   R     Signed and Held   Signed and Held  Troponin I - Now Then Q6H  Now then every 6 hours,    R     Signed and Held          Vitals/Pain Today's Vitals   05/30/18 1426 05/30/18 1500 05/30/18 1530 05/30/18 1600  BP: (!) 130/58 (!) 131/95 118/74 126/71  Pulse: (!) 116 (!) 104 (!) 108 (!) 129  Resp:  14 15 (!) 21  Temp:      TempSrc:      SpO2: 100%     Weight:      Height:      PainSc:        Isolation Precautions No active isolations  Medications Medications  0.9 %  sodium chloride infusion (has no administration in time range)  diltiazem (CARDIZEM) 1 mg/mL load via infusion 20 mg (20 mg Intravenous Bolus from Bag 05/30/18 1342)    And  diltiazem (CARDIZEM) 100 mg in dextrose 5% 171mL (1 mg/mL) infusion (15 mg/hr Intravenous New Bag/Given 05/30/18 1341)  sodium chloride 0.9 % bolus 500 mL (0 mLs Intravenous Stopped 05/30/18 1609)    Mobility walks with person assist Moderate fall risk   Focused Assessments Cardiac Assessment Handoff:  Cardiac Rhythm: Atrial fibrillation Lab Results  Component Value Date   TROPONINI <0.03 05/06/2018   Lab Results  Component Value Date   DDIMER 2.25 (H) 06/13/2017   Does the Patient currently have chest pain? No     R Recommendations: See Admitting Provider Note  Report given to:   Additional Notes:  Pt is jehovah's witness.

## 2018-05-30 NOTE — ED Provider Notes (Signed)
[facility] EMERGENCY DEPARTMENT Provider Note   CSN: [id number] Arrival date & time: 05/30/18  1248    History   Chief Complaint Chief Complaint  Patient presents with  . Chest Pain    HPI Michaela Little is a 59 y.o. female.     58 year old female with history of lung cancer, pulmonary embolism, severe anemia and on Lovenox  presents with shortness of breath with chest discomfort.  Symptoms began at rest and are worse with exertion.  Has noted some palpitations with this and denies any syncope or near syncope.  Patient has known hemoglobin of 4 but has refused blood transfusions for religious reasons.  Denies any history of active blood loss.  No cough congestion or fever.  Chest pain is substernal and nonradiating.  Patient chronically uses oxygen at 2 L at baseline.  Called EMS and was found to be in A. fib with RVR at a rate of 172 and transported here.     Past Medical History:  Diagnosis Date  . Anxiety   . Arthritis    hands, knees, hips  . Asthma   . Bronchitis   . Cancer ([place])   . Cervical stenosis (uterine cervix)   . Disorder of appendix    Enlarged  . Dyspnea on exertion   . GERD (gastroesophageal reflux disease)   . Headache(784.0)   . History of breast cancer    2010--  LEFT  s/p  mastectomy (in [place]) AND CHEMORADIATION--  NO RECURRENCE  . History of cervical dysplasia   . Hyperlipidemia   . Hypertension   . Malignant neoplasm of overlapping sites of left breast in female, estrogen receptor positive ([place]) 03/05/2013  . OSA (obstructive sleep apnea) moderate osa per study  09/2010   CPAP  , NOT USING ON REGULAR BASIS  . Pelvic pain in female   . Positive H. pylori test    08-05-2013  . Positive TB test    AS TEEN--  [place] W/ MEDS  . Refusal of blood transfusions as patient is Jehovah's Witness   . Seasonal allergies   . Uterine fibroid   . Wears glasses     Patient Active Problem List   Diagnosis Date Noted  . Malnutrition of  moderate degree 05/04/2018  . Hypercalcemia 04/18/2018  . Metastatic breast cancer ([place])   . Pulmonary embolism ([place]) 02/27/2018  . Acute deep vein thrombosis (DVT) of proximal vein of right lower extremity ([place]) 01/22/2018  . 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  . Pancytopenia ([place]) 01/02/2018  . Patient is Jehovah's Witness   . Refusal of blood transfusions as patient is Jehovah's Witness   . High risk medication use 09/25/2017  . Lower extremity edema 09/25/2017  . S/P laparoscopic appendectomy 08/22/2017  . Goals of care, counseling/discussion 01/20/2017  . Multinodular thyroid 02/08/2016  . Lumbar back pain with radiculopathy affecting left lower extremity 02/08/2016  . Carcinoma of breast metastatic to bone ([place]) 02/08/2016  . Bone metastases ([place]) 11/13/2015  . Glaucoma suspect of right eye 04/18/2015  . Right thyroid nodule 06/30/2014  . Allergic rhinitis due to pollen 06/30/2014  . History of left mastectomy 01/15/2014  . History of uterine fibroid 01/15/2014  . Advance directive on file 01/14/2014  . Essential hypertension 12/24/2013  . Left ventricular diastolic dysfunction with preserved systolic function 29/52/8413  . Amblyopia, both eyes 07/29/2013  . Cataract 07/29/2013  . High myopia 07/29/2013  . Morbid (severe) obesity due to excess calories ([place]) 06/23/2013  .  GERD (gastroesophageal reflux disease) 03/25/2013  . Constipation 03/25/2013  . Other and unspecified hyperlipidemia 03/25/2013  . Malignant neoplasm of overlapping sites of left breast in female, estrogen receptor positive ([place]) 03/05/2013  . Hyperlipidemia 11/13/2011  . Dysplasia of cervix, low grade (CIN 1) 08/22/2011  . Diastolic dysfunction 75/64/3329  . Influenza 03/04/2011  . Obesity 03/04/2011  . OSA (obstructive sleep apnea) 02/27/2011  . Asthma 02/08/2011    Past Surgical History:  Procedure Laterality Date  . APPENDECTOMY  y  . CARDIOVASCULAR STRESS TEST  10-29-2012   low risk  perfusion study/  no significant reversibity/ ef 66%/  normal wall motion  . CERVICAL CONIZATION W/BX  2012   in  [place] N/A 05/14/2012   Procedure: LAPAROSCOPIC CHOLECYSTECTOMY;  Surgeon: Ralene Ok, MD;  Location: [facility];  Service: General;  Laterality: N/A;  . COLONOSCOPY  2011   normal per patient - [place]  . DILATION AND CURETTAGE OF UTERUS  10/15/2011   Procedure: DILATATION AND CURETTAGE;  Surgeon: Melina Schools, MD;  Location: [facility];  Service: Gynecology;  Laterality: N/A;  Conization &  endocervical curettings  . EXAMINATION UNDER ANESTHESIA N/A 08/20/2013   Procedure: EXAM UNDER ANESTHESIA;  Surgeon: Margarette Asal, MD;  Location: [facility];  Service: Gynecology;  Laterality: N/A;  . [place]   right  . IR IVC FILTER PLMT / S&I /IMG GUID/MOD SED  03/01/2018  . IR RADIOLOGIST EVAL & MGMT  04/08/2018  . LAPAROSCOPIC APPENDECTOMY N/A 08/22/2017   Procedure: [place];  Surgeon: Coralie Keens, MD;  Location: [facility];  Service: General;  Laterality: N/A;  . LAPAROSCOPIC ASSISTED VAGINAL HYSTERECTOMY N/A 10/26/2014   Procedure: HYSTERECTOMY ABDOMINAL ;  Surgeon: Molli Posey, MD;  Location: [facility];  Service: Gynecology;  Laterality: N/A;  . MASTECTOMY Left 11/2008  in [place]   . SALPINGOOPHORECTOMY Bilateral 10/26/2014   Procedure: BILATERAL SALPINGO OOPHORECTOMY;  Surgeon: Molli Posey, MD;  Location: [facility];  Service: Gynecology;  Laterality: Bilateral;  . TISSUE EXPANDER PLACEMENT Left 08/22/2017   Procedure: REMOVAL OF LEFT TISSUE EXPANDER;  Surgeon: Crissie Reese, MD;  Location: [facility];  Service: Plastics;  Laterality: Left;  . TISSUE EXPANDER REMOVAL Left 08/22/2017  . TRANSTHORACIC ECHOCARDIOGRAM  10-29-2012   mild lvh/  ef 60-65%/  grade II diastolic dysfunction/  trivial mr  &  tr     OB History    Gravida  10   Para  5   Term  5   Preterm      AB  5   Living  5     SAB  3    TAB  2   Ectopic      Multiple      Live Births               Home Medications    Prior to Admission medications   Medication Sig Start Date End Date Taking? Authorizing Provider  acetaminophen (TYLENOL) 500 MG tablet Take 500 mg by mouth every 6 (six) hours as needed for mild pain.    [provider]  amLODipine (NORVASC) 10 MG tablet Take 1 tablet (10 mg total) by mouth daily. 04/22/18   Kayleen Memos, DO  cetirizine (ZYRTEC) 10 MG tablet Take 10 mg by mouth daily. 12/19/17   [provider]  docusate sodium (COLACE) 100 MG capsule Take 1 capsule (100 mg total) by mouth  daily as needed for mild constipation. 04/28/18   Hosie Poisson, MD  enoxaparin (LOVENOX) 100 MG/ML injection Inject 1 mL (100 mg total) into the skin every 12 (twelve) hours. 05/01/18   [name], MD  feeding supplement, ENSURE ENLIVE, (ENSURE ENLIVE) LIQD Take 237 mLs by mouth 2 (two) times daily between meals. Patient taking differently: Take 237 mLs by mouth 2 (two) times daily between meals. Non-dairy Ensure 04/28/18   Hosie Poisson, MD  fluticasone (FLONASE) 50 MCG/ACT nasal spray Place 1 spray into both nostrils daily. 04/29/18   Hosie Poisson, MD  fluticasone furoate-vilanterol (BREO ELLIPTA) 100-25 MCG/INH AEPB Inhale 1 puff into the lungs daily. 05/21/18   Elsie Stain, MD  folic acid (FOLVITE) 1 MG tablet Take 1 tablet (1 mg total) by mouth daily. 05/16/18   [name], MD  furosemide (LASIX) 20 MG tablet Take 20 mg by mouth daily as needed for fluid. 04/29/18   [provider]  hydrALAZINE (APRESOLINE) 10 MG tablet Take 1 tablet (10 mg total) by mouth 3 (three) times daily. 04/22/18   Kayleen Memos, DO  ipratropium-albuterol (DUONEB) 0.5-2.5 (3) MG/3ML SOLN INHALE THREE MLS VIA NEBULIZER EVERY 6 HOURS AS NEEDED Patient taking differently: Take 3 mLs by nebulization every 6 (six) hours as needed (for wheezing or shortness of breath).  02/13/18   Clent Demark, PA-C  lactulose  (CHRONULAC) 10 GM/15ML solution Take 15-30 mLs (10-20 g total) by mouth 3 (three) times daily. 05/01/18   [name], MD  Multiple Vitamins-Minerals (MULTIVITAMIN ADULT PO) Take 1 tablet by mouth daily.    [provider]  polyethylene glycol (MIRALAX / GLYCOLAX) packet Take 17 g by mouth daily. 04/22/18   Kayleen Memos, DO  predniSONE (DELTASONE) 10 MG tablet Take 4 tablets daily for 5 days then stop 05/21/18   Elsie Stain, MD  promethazine-dextromethorphan (PROMETHAZINE-DM) 6.25-15 MG/5ML syrup Take 2.5 mLs by mouth 4 (four) times daily as needed for cough. 05/15/18   Elsie Stain, MD  traMADol (ULTRAM) 50 MG tablet Take 2 tablets (100 mg total) by mouth every 6 (six) hours. Patient taking differently: Take 100 mg by mouth every 6 (six) hours as needed for moderate pain or severe pain.  03/08/18   Cristal Ford, DO  vitamin B-12 1000 MCG tablet Take 1 tablet (1,000 mcg total) by mouth daily. 01/05/18   Geradine Girt, DO    Family History Family History  Problem Relation Age of Onset  . Breast cancer Mother   . Colon cancer Mother   . Hypotension Mother   . Asthma Mother   . Diabetes type II Mother   . Arthritis Mother   . Clotting disorder Mother   . Cancer Mother        breast/colon  . Mental illness Brother   . Heart disease Brother   . Cerebral palsy Daughter   . Emphysema Brother        never smoker  . Colon cancer Maternal Aunt   . Cancer Maternal Aunt        colon  . Colon cancer Maternal Uncle   . Cancer Maternal Uncle        colon  . Esophageal cancer Neg Hx   . Rectal cancer Neg Hx   . Stomach cancer Neg Hx   . Thyroid disease Neg Hx     Social History Social History   Tobacco Use  . Smoking status: Former Smoker    Packs/day: 0.30  Years: 10.00    Pack years: 3.00    Types: Cigarettes    Last attempt to quit: 03/26/1988    Years since quitting: 30.1  . Smokeless tobacco: Never Used  Substance Use Topics  . Alcohol use: No  . Drug  use: No     Allergies   Lisinopril-hydrochlorothiazide; Adhesive [tape]; Fentanyl; Gabapentin; Hctz [hydrochlorothiazide]; Latex; Lisinopril; Losartan potassium; Other; Oxycodone; and Tums [calcium carbonate antacid]   Review of Systems Review of Systems  All other systems reviewed and are negative.    Physical Exam Updated Vital Signs BP (!) 140/110 (BP Location: Right Arm)   Pulse (!) 156   Temp 98.5 F (36.9 C) (Oral)   Resp (!) 23   Ht 1.702 m (5\' 7" )   Wt 101.6 kg   LMP 02/25/2009   SpO2 100%   BMI 35.08 kg/m   Physical Exam Vitals signs and nursing note reviewed.  Constitutional:      General: She is not in acute distress.    Appearance: Normal appearance. She is well-developed. She is not toxic-appearing.  HENT:     Head: Normocephalic and atraumatic.  Eyes:     General: Lids are normal.     Conjunctiva/sclera: Conjunctivae normal.     Pupils: Pupils are equal, round, and reactive to light.     Comments: Pale conjunctiva  Neck:     Musculoskeletal: Normal range of motion and neck supple.     Thyroid: No thyroid mass.     Trachea: No tracheal deviation.  Cardiovascular:     Rate and Rhythm: Tachycardia present. Rhythm irregular.     Heart sounds: Normal heart sounds. No murmur. No gallop.   Pulmonary:     Effort: Pulmonary effort is normal. No respiratory distress.     Breath sounds: Normal breath sounds. No stridor. No decreased breath sounds, wheezing, rhonchi or rales.  Abdominal:     General: Bowel sounds are normal. There is no distension.     Palpations: Abdomen is soft.     Tenderness: There is no abdominal tenderness. There is no rebound.  Musculoskeletal: Normal range of motion.        General: No tenderness.  Skin:    General: Skin is warm and dry.     Findings: No abrasion or rash.  Neurological:     Mental Status: She is alert and oriented to person, place, and time.     GCS: GCS eye subscore is 4. GCS verbal subscore is 5. GCS motor  subscore is 6.     Cranial Nerves: No cranial nerve deficit.     Sensory: No sensory deficit.  Psychiatric:        Speech: Speech normal.        Behavior: Behavior normal.      ED Treatments / Results  Labs (all labs ordered are listed, but only abnormal results are displayed) Labs Reviewed  CBC WITH DIFFERENTIAL/PLATELET  COMPREHENSIVE METABOLIC PANEL  APTT  I-STAT TROPONIN, ED    EKG EKG Interpretation  Date/Time:  Friday May 30 2018 12:55:12 EST Ventricular Rate:  146 PR Interval:    QRS Duration: 82 QT Interval:  250 QTC Calculation: 390 R Axis:   41 Text Interpretation:  Atrial fibrillation Abnormal R-wave progression, early transition Nonspecific repol abnormality, inferior leads Confirmed by Lacretia Leigh ([id number]) on 05/30/2018 1:14:07 PM   Radiology No results found.  Procedures Procedures (including critical care time)  Medications Ordered in ED Medications  sodium chloride 0.9 % bolus  500 mL (has no administration in time range)  0.9 %  sodium chloride infusion (has no administration in time range)  diltiazem (CARDIZEM) 1 mg/mL load via infusion 20 mg (has no administration in time range)    And  diltiazem (CARDIZEM) 100 mg in dextrose 5% 162mL (1 mg/mL) infusion (has no administration in time range)     Initial Impression / Assessment and Plan / ED Course  I have reviewed the triage vital signs and the nursing notes.  Pertinent labs & imaging results that were available during my care of the patient were reviewed by me and considered in my medical decision making (see chart for details).        Patient presents with A. fib with RVR and was given Cardizem bolus x2 as well as placed on Cardizem drip which was titrated to heart rate and blood pressure.  Hemoglobin 5.2 noted which is improved from prior.  Due to religious reasons patient would not be given a blood transfusion.  Patient to be admitted to the hospitalist service  CRITICAL  CARE Performed by: Leota Jacobsen Total critical care time: 60 minutes Critical care time was exclusive of separately billable procedures and treating other patients. Critical care was necessary to treat or prevent imminent or life-threatening deterioration. Critical care was time spent personally by me on the following activities: development of treatment plan with patient and/or surrogate as well as nursing, discussions with consultants, evaluation of patient's response to treatment, examination of patient, obtaining history from patient or surrogate, ordering and performing treatments and interventions, ordering and review of laboratory studies, ordering and review of radiographic studies, pulse oximetry and re-evaluation of patient's condition.   Final Clinical Impressions(s) / ED Diagnoses   Final diagnoses:  SOB (shortness of breath)    ED Discharge Orders    None       Lacretia Leigh, MD 06/02/18 1314

## 2018-05-30 NOTE — H&P (Signed)
History and Physical    Michaela Little:[id number] DOB: 19-May-1960 DOA: 05/30/2018  PCP: Ladell Pier, MD  Patient coming from: home   I have personally briefly reviewed patient's old medical records available.   Chief Complaint: chest pain and pressure sensation   HPI: Michaela Little is a 58 y.o. female with medical history significant of metastatic breast cancer, pulmonary embolism, DVT, severe anemia, on Lovenox, Jehovah's Witness with hemoglobin at about 4-5, presented to the emergency room with shortness of breath and chest discomfort.  According to the patient, she started this today morning, insidious in onset, felt palpitations and diffuse chest pressure sensation.  She did feel somehow dizzy and shortness of breath but did not pass out.  She is noted to have a heme baseline hemoglobin of 4.  Denies any active blood loss.  No cough, no fever no congestion.  Chest pain is mostly substernal no radiation and mild intensity.  Patient is currently on Lovenox 100 mg twice daily at home. Patient has chronic weakness, fatigue and also has musculoskeletal pain secondary to bony metastasis. ED Course: EMS found her with heart rate of 172 with A. fib.  Her blood pressures were fairly stable in the emergency room.  Hemoglobin is 5.2 which is historically best.  Potassium 3.1.  Renal functions are normal.  Twelve-lead EKG shows A. fib with RVR.  Patient was started on Cardizem infusion in the emergency room.  Review of Systems: As per HPI otherwise 10 point review of systems negative.    Past Medical History:  Diagnosis Date  . Anxiety   . Arthritis    hands, knees, hips  . Asthma   . Bronchitis   . Cancer ([place])   . Cervical stenosis (uterine cervix)   . Disorder of appendix    Enlarged  . Dyspnea on exertion   . GERD (gastroesophageal reflux disease)   . Headache(784.0)   . History of breast cancer    2010--  LEFT  s/p  mastectomy (in [place]) AND CHEMORADIATION--  NO RECURRENCE  .  History of cervical dysplasia   . Hyperlipidemia   . Hypertension   . Malignant neoplasm of overlapping sites of left breast in female, estrogen receptor positive ([place]) 03/05/2013  . OSA (obstructive sleep apnea) moderate osa per study  09/2010   CPAP  , NOT USING ON REGULAR BASIS  . Pelvic pain in female   . Positive H. pylori test    08-05-2013  . Positive TB test    AS TEEN--  [place] W/ MEDS  . Refusal of blood transfusions as patient is Jehovah's Witness   . Seasonal allergies   . Uterine fibroid   . Wears glasses     Past Surgical History:  Procedure Laterality Date  . APPENDECTOMY  y  . CARDIOVASCULAR STRESS TEST  10-29-2012   low risk perfusion study/  no significant reversibity/ ef 66%/  normal wall motion  . CERVICAL CONIZATION W/BX  2012   in  [place] N/A 05/14/2012   Procedure: LAPAROSCOPIC CHOLECYSTECTOMY;  Surgeon: Ralene Ok, MD;  Location: [facility];  Service: General;  Laterality: N/A;  . COLONOSCOPY  2011   normal per patient - [place]  . DILATION AND CURETTAGE OF UTERUS  10/15/2011   Procedure: DILATATION AND CURETTAGE;  Surgeon: Melina Schools, MD;  Location: [facility];  Service: Gynecology;  Laterality: N/A;  Conization &  endocervical curettings  . EXAMINATION UNDER ANESTHESIA N/A 08/20/2013   Procedure: Jasmine December  UNDER ANESTHESIA;  Surgeon: Margarette Asal, MD;  Location: [facility];  Service: Gynecology;  Laterality: N/A;  . [place]   right  . IR IVC FILTER PLMT / S&I /IMG GUID/MOD SED  03/01/2018  . IR RADIOLOGIST EVAL & MGMT  04/08/2018  . LAPAROSCOPIC APPENDECTOMY N/A 08/22/2017   Procedure: Naomi;  Surgeon: Coralie Keens, MD;  Location: [facility];  Service: General;  Laterality: N/A;  . LAPAROSCOPIC ASSISTED VAGINAL HYSTERECTOMY N/A 10/26/2014   Procedure: HYSTERECTOMY ABDOMINAL ;  Surgeon: Molli Posey, MD;  Location: [facility];  Service: Gynecology;  Laterality: N/A;  . MASTECTOMY Left 11/2008  in  [place]   . SALPINGOOPHORECTOMY Bilateral 10/26/2014   Procedure: BILATERAL SALPINGO OOPHORECTOMY;  Surgeon: Molli Posey, MD;  Location: [facility];  Service: Gynecology;  Laterality: Bilateral;  . TISSUE EXPANDER PLACEMENT Left 08/22/2017   Procedure: REMOVAL OF LEFT TISSUE EXPANDER;  Surgeon: Crissie Reese, MD;  Location: [facility];  Service: Plastics;  Laterality: Left;  . TISSUE EXPANDER REMOVAL Left 08/22/2017  . TRANSTHORACIC ECHOCARDIOGRAM  10-29-2012   mild lvh/  ef 60-65%/  grade II diastolic dysfunction/  trivial mr  &  tr     reports that she quit smoking about 30 years ago. Her smoking use included cigarettes. She has a 3.00 pack-year smoking history. She has never used smokeless tobacco. She reports that she does not drink alcohol or use drugs.  Allergies  Allergen Reactions  . Lisinopril-Hydrochlorothiazide Itching  . Adhesive [Tape] Itching and Other (See Comments)    Redness  . Fentanyl Other (See Comments)    GI upset and drowsiness. Only to [facility]  . Gabapentin Other (See Comments)    Auditory hallucinations  . Hctz [Hydrochlorothiazide] Itching  . Latex Itching  . Lisinopril Itching  . Losartan Potassium Other (See Comments)    Makes her feel "bad"   . Other Other (See Comments)    Patient refuses blood for religious reasons (per her notes)  . Oxycodone     GI upset, drowsy  . Tums [Calcium Carbonate Antacid] Hives    FRUIT-FLAVORED ONES    Family History  Problem Relation Age of Onset  . Breast cancer Mother   . Colon cancer Mother   . Hypotension Mother   . Asthma Mother   . Diabetes type II Mother   . Arthritis Mother   . Clotting disorder Mother   . Cancer Mother        breast/colon  . Mental illness Brother   . Heart disease Brother   . Cerebral palsy Daughter   . Emphysema Brother        never smoker  . Colon cancer Maternal Aunt   . Cancer Maternal Aunt        colon  . Colon cancer Maternal Uncle   . Cancer Maternal  Uncle        colon  . Esophageal cancer Neg Hx   . Rectal cancer Neg Hx   . Stomach cancer Neg Hx   . Thyroid disease Neg Hx      Prior to Admission medications   Medication Sig Start Date End Date Taking? Authorizing Provider  acetaminophen (TYLENOL) 500 MG tablet Take 500 mg by mouth every 6 (six) hours as needed for mild pain.   Yes [provider]  amLODipine (NORVASC) 10 MG tablet Take 1 tablet (10 mg total) by mouth daily. 04/22/18  Yes Irene Pap  N, DO  cetirizine (ZYRTEC) 10 MG tablet Take 10 mg by mouth daily as needed for allergies or rhinitis.  12/19/17  Yes [provider]  docusate sodium (COLACE) 100 MG capsule Take 1 capsule (100 mg total) by mouth daily as needed for mild constipation. Patient taking differently: Take 200 mg by mouth every other day.  04/28/18  Yes Hosie Poisson, MD  enoxaparin (LOVENOX) 120 MG/0.8ML injection Inject 100 mg into the skin every 12 (twelve) hours.    Yes [provider]  feeding supplement, ENSURE ENLIVE, (ENSURE ENLIVE) LIQD Take 237 mLs by mouth 2 (two) times daily between meals. Patient taking differently: Take 237 mLs by mouth See admin instructions. Drink 237 ml's by mouth two to three times a day (Non-dairy Ensure) 04/28/18  Yes Hosie Poisson, MD  fluticasone (FLONASE) 50 MCG/ACT nasal spray Place 1 spray into both nostrils daily. 04/29/18  Yes Hosie Poisson, MD  fluticasone furoate-vilanterol (BREO ELLIPTA) 100-25 MCG/INH AEPB Inhale 1 puff into the lungs daily. Patient taking differently: Inhale 1 puff into the lungs every evening.  05/21/18  Yes Elsie Stain, MD  folic acid (FOLVITE) 1 MG tablet Take 1 tablet (1 mg total) by mouth daily. 05/16/18  Yes [name], MD  hydrALAZINE (APRESOLINE) 10 MG tablet Take 1 tablet (10 mg total) by mouth 3 (three) times daily. 04/22/18  Yes Hall, Carole N, DO  ipratropium-albuterol (DUONEB) 0.5-2.5 (3) MG/3ML SOLN INHALE THREE MLS VIA NEBULIZER EVERY 6 HOURS AS NEEDED Patient  taking differently: Take 3 mLs by nebulization every 6 (six) hours as needed (for wheezing or shortness of breath).  02/13/18  Yes Clent Demark, PA-C  Iron-Vitamins (GERITOL COMPLETE) TABS Take 1 tablet by mouth 3 (three) times a week.   Yes [provider]  polyethylene glycol (MIRALAX / GLYCOLAX) packet Take 17 g by mouth daily. Patient taking differently: Take 17 g by mouth daily as needed for mild constipation.  04/22/18  Yes Kayleen Memos, DO  traMADol (ULTRAM) 50 MG tablet Take 2 tablets (100 mg total) by mouth every 6 (six) hours. Patient taking differently: Take 50 mg by mouth every 6 (six) hours as needed for moderate pain or severe pain.  03/08/18  Yes Mikhail, [place], DO  vitamin B-12 1000 MCG tablet Take 1 tablet (1,000 mcg total) by mouth daily. Patient taking differently: Take 1,000 mcg by mouth daily as needed (for supplementation).  01/05/18  Yes Vann, Jessica U, DO  furosemide (LASIX) 20 MG tablet Take 20 mg by mouth daily as needed for fluid. 04/29/18   [provider]    Physical Exam: Vitals:   05/30/18 1500 05/30/18 1530 05/30/18 1600 05/30/18 1701  BP: (!) 131/95 118/74 126/71 (!) 159/123  Pulse: (!) 104 (!) 108 (!) 129 (!) 113  Resp: 14 15 (!) 21 16  Temp:    98.1 F (36.7 C)  TempSrc:    Oral  SpO2:    100%  Weight:    99.5 kg  Height:    5\' 7"  (1.702 m)    Constitutional: NAD, calm, comfortable Vitals:   05/30/18 1500 05/30/18 1530 05/30/18 1600 05/30/18 1701  BP: (!) 131/95 118/74 126/71 (!) 159/123  Pulse: (!) 104 (!) 108 (!) 129 (!) 113  Resp: 14 15 (!) 21 16  Temp:    98.1 F (36.7 C)  TempSrc:    Oral  SpO2:    100%  Weight:    99.5 kg  Height:    5\' 7"  (  1.702 m)   Eyes: PERRL, lids and conjunctivae normal Chronically sick looking.  Pale. ENMT: Mucous membranes are moist. Posterior pharynx clear of any exudate or lesions.Normal dentition.  Neck: normal, supple, no masses, no thyromegaly Respiratory: clear to auscultation  bilaterally, no wheezing, no crackles. Normal respiratory effort. No accessory muscle use.  Cardiovascular: Irregular irregular, no murmurs / rubs / gallops. No extremity edema.  Trace bilateral pedal pulses. No carotid bruits.  Abdomen: no tenderness, no masses palpated. No hepatosplenomegaly. Bowel sounds positive.  Musculoskeletal: no clubbing / cyanosis. No joint deformity upper and lower extremities. Good ROM, no contractures. Normal muscle tone.  Skin: no rashes, lesions, ulcers. No induration Neurologic: CN 2-12 grossly intact. Sensation intact, DTR normal. Strength 5/5 in all 4.  Psychiatric: Normal judgment and insight. Alert and oriented x 3.  Anxious.    Labs on Admission: I have personally reviewed following labs and imaging studies  CBC: Recent Labs  Lab 05/30/18 1308  WBC 6.7  NEUTROABS 2.8  HGB 5.2*  HCT 18.6*  MCV 100.0  PLT 267*   Basic Metabolic Panel: Recent Labs  Lab 05/30/18 1308  NA 137  K 3.1*  CL 105  CO2 21*  GLUCOSE 146*  BUN 14  CREATININE 0.97  CALCIUM 12.8*   GFR: Estimated Creatinine Clearance: 74.8 mL/min (by C-G formula based on SCr of 0.97 mg/dL). Liver Function Tests: Recent Labs  Lab 05/30/18 1308  AST 31  ALT 17  ALKPHOS 127*  BILITOT 1.1  PROT 7.0  ALBUMIN 3.2*   No results for input(s): LIPASE, AMYLASE in the last 168 hours. No results for input(s): AMMONIA in the last 168 hours. Coagulation Profile: No results for input(s): INR, PROTIME in the last 168 hours. Cardiac Enzymes: No results for input(s): CKTOTAL, CKMB, CKMBINDEX, TROPONINI in the last 168 hours. BNP (last 3 results) No results for input(s): PROBNP in the last 8760 hours. HbA1C: No results for input(s): HGBA1C in the last 72 hours. CBG: No results for input(s): GLUCAP in the last 168 hours. Lipid Profile: No results for input(s): CHOL, HDL, LDLCALC, TRIG, CHOLHDL, LDLDIRECT in the last 72 hours. Thyroid Function Tests: No results for input(s): TSH,  T4TOTAL, FREET4, T3FREE, THYROIDAB in the last 72 hours. Anemia Panel: No results for input(s): VITAMINB12, FOLATE, FERRITIN, TIBC, IRON, RETICCTPCT in the last 72 hours. Urine analysis:    Component Value Date/Time   COLORURINE YELLOW 05/03/2018 0044   APPEARANCEUR CLEAR 05/03/2018 0044   LABSPEC 1.014 05/03/2018 0044   LABSPEC 1.025 01/11/2016 1233   PHURINE 6.0 05/03/2018 0044   GLUCOSEU NEGATIVE 05/03/2018 0044   GLUCOSEU Negative 01/11/2016 1233   HGBUR NEGATIVE 05/03/2018 0044   BILIRUBINUR NEGATIVE 05/03/2018 0044   BILIRUBINUR small 12/03/2016 1117   BILIRUBINUR Negative 01/11/2016 [place] 05/03/2018 0044   PROTEINUR NEGATIVE 05/03/2018 0044   UROBILINOGEN 1.0 12/03/2016 1117   UROBILINOGEN 1.0 09/13/2016 1306   UROBILINOGEN 0.2 01/11/2016 1233   NITRITE NEGATIVE 05/03/2018 0044   LEUKOCYTESUR NEGATIVE 05/03/2018 0044   LEUKOCYTESUR Small 01/11/2016 1233    Radiological Exams on Admission: Dg Chest 2 View  Result Date: 05/30/2018 CLINICAL DATA:  Dyspnea, chest pain EXAM: CHEST - 2 VIEW COMPARISON:  05/16/2018 chest radiograph. FINDINGS: Surgical clips overlie the left axilla. Stable cardiomediastinal silhouette with heart size. No pneumothorax. No pleural effusion. No overt pulmonary edema. No acute consolidative airspace disease. Stable widespread sclerotic osseous metastatic disease. IMPRESSION: No active cardiopulmonary disease. 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  Electronically Signed   By: Ilona Sorrel M.D.   On: 05/30/2018 14:21    EKG: Independently reviewed.  A. fib with abnormal R wave progression, ventricular rate 146.  Assessment/Plan Principal Problem:   Atrial fibrillation with RVR ([facility]) Active Problems:   Asthma   OSA (obstructive sleep apnea)   Hyperlipidemia   Malignant neoplasm of overlapping sites of left breast in female, estrogen receptor positive ([facility])   GERD (gastroesophageal reflux disease)    Essential hypertension   Bone metastases ([facility])   Refusal of blood transfusions as patient is Jehovah's Witness   Pulmonary embolism ([place])   New onset a-fib ([place])     1.  Atrial fibrillation with RVR: New onset.  Patient with underlying malignancy, recent flulike symptoms, severe anemia. Agree with admission to monitored unit due to severity of symptoms. Start on Cardizem to titrate to keep heart rate less than 100. Will increase dose of carvedilol to 12.5 mg twice a day in the hope of reducing Cardizem.  Discontinue amlodipine As once rate controlled she will require diltiazem. Patient is therapeutic on Lovenox, will continue 100 mg twice a day. Will order 2D echocardiogram, however this will most likely not change any management.  2.  Chest discomfort: Due to above.  Less likely acute coronary syndrome.  Will cycle troponins and EKG.  On Lovenox.  3.  Obstructive sleep apnea: Not on treatment.  4.  Hypertension: Fairly stable.  Discontinue amlodipine.  Increase dose of Coreg.  5.  Malignant breast cancer, bony metastasis, severe chronic anemia refusal to transfusion as patient is Jehovah's Witness: Followed by oncology.  Supportive treatment.  6.  Pulmonary embolism and DVT: On therapeutic dose of Lovenox.  This patient has new onset A. fib with RVR and needing IV infusions to control her heart rate.  She also has multiple other medical problems.  Anticipate patient will stay more than 2 midnights in the hospital to titrate her medications and continues close monitoring.  DVT prophylaxis: Therapeutic on Lovenox. Code Status: Full code. Family Communication: Brother at the bedside. Disposition Plan: Home with home health care when stable. Consults called: None. Admission status: Inpatient cardiac telemetry   Barb Merino MD [facility] Pager [phone]  If 7PM-7AM, please contact night-coverage [email or web address] Password [facility]  05/30/2018, 5:28 PM

## 2018-05-30 NOTE — ED Triage Notes (Addendum)
Pt arrives via EMS from home with CP that began about an hour ago, substernal chest pain nonradiating, with SOB, wears O2 at 2L at baseline. Pt pale, last hgb 4.2, diaphoretic. Alert, oriented x4. Last bp 108/? HR 148-172 afib RVR. Pt given 324mg  ASA, 2SL with pain from 8/10 to 6/10. Wants to refuse blood products as if Jehovah Witness.

## 2018-05-31 ENCOUNTER — Inpatient Hospital Stay (HOSPITAL_COMMUNITY): Payer: Medicare Other

## 2018-05-31 ENCOUNTER — Encounter (HOSPITAL_COMMUNITY): Payer: Self-pay | Admitting: Radiology

## 2018-05-31 ENCOUNTER — Other Ambulatory Visit: Payer: Self-pay

## 2018-05-31 DIAGNOSIS — R9431 Abnormal electrocardiogram [ECG] [EKG]: Secondary | ICD-10-CM

## 2018-05-31 LAB — BASIC METABOLIC PANEL
Anion gap: 7 (ref 5–15)
BUN: 14 mg/dL (ref 6–20)
CO2: 23 mmol/L (ref 22–32)
Calcium: 12.4 mg/dL — ABNORMAL HIGH (ref 8.9–10.3)
Chloride: 109 mmol/L (ref 98–111)
Creatinine, Ser: 1.02 mg/dL — ABNORMAL HIGH (ref 0.44–1.00)
GFR calc Af Amer: >60 mL/min (ref >60)
GFR calc non Af Amer: 60 mL/min — ABNORMAL LOW (ref >60)
GLUCOSE: 121 mg/dL — AB (ref 70–99)
Potassium: 4 mmol/L (ref 3.5–5.1)
Sodium: 139 mmol/L (ref 135–145)

## 2018-05-31 LAB — TROPONIN I: Troponin I: <0.03 ng/mL (ref <0.03)

## 2018-05-31 LAB — URINALYSIS, ROUTINE W REFLEX MICROSCOPIC
Bilirubin Urine: NEGATIVE
Glucose, UA: NEGATIVE mg/dL
Hgb urine dipstick: NEGATIVE
Ketones, ur: NEGATIVE mg/dL
Leukocytes,Ua: NEGATIVE
Nitrite: NEGATIVE
PH: 6 (ref 5.0–8.0)
Protein, ur: NEGATIVE mg/dL
Specific Gravity, Urine: 1.016 (ref 1.005–1.030)

## 2018-05-31 LAB — CBC
HCT: 15.2 % — ABNORMAL LOW (ref 36.0–46.0)
Hemoglobin: 4.3 g/dL — CL (ref 12.0–15.0)
MCH: 28.5 pg (ref 26.0–34.0)
MCHC: 28.3 g/dL — ABNORMAL LOW (ref 30.0–36.0)
MCV: 100.7 fL — ABNORMAL HIGH (ref 80.0–100.0)
Platelets: 103 10*3/uL — ABNORMAL LOW (ref 150–400)
RBC: 1.51 MIL/uL — AB (ref 3.87–5.11)
RDW: 20.5 % — ABNORMAL HIGH (ref 11.5–15.5)
WBC: 4.1 10*3/uL (ref 4.0–10.5)
nRBC: 8.4 % — ABNORMAL HIGH (ref 0.0–0.2)

## 2018-05-31 LAB — ECHOCARDIOGRAM COMPLETE
Height: 67 in
Weight: 3574.4 oz

## 2018-05-31 MED ORDER — BISACODYL 5 MG PO TBEC
10.0000 mg | DELAYED_RELEASE_TABLET | Freq: Every day | ORAL | Status: DC
  Administered 2018-05-31 – 2018-06-06 (×6): 10 mg via ORAL
  Filled 2018-05-31 (×7): qty 2

## 2018-05-31 MED ORDER — SODIUM CHLORIDE 0.9 % IV SOLN
510.0000 mg | Freq: Once | INTRAVENOUS | Status: AC
  Administered 2018-05-31: 510 mg via INTRAVENOUS
  Filled 2018-05-31: qty 17

## 2018-05-31 MED ORDER — FERROUS SULFATE 325 (65 FE) MG PO TABS
325.0000 mg | ORAL_TABLET | Freq: Three times a day (TID) | ORAL | Status: DC
  Administered 2018-05-31 – 2018-06-06 (×16): 325 mg via ORAL
  Filled 2018-05-31 (×16): qty 1

## 2018-05-31 MED ORDER — POLYETHYLENE GLYCOL 3350 17 G PO PACK
17.0000 g | PACK | Freq: Every day | ORAL | Status: DC
  Administered 2018-05-31 – 2018-06-03 (×4): 17 g via ORAL
  Filled 2018-05-31 (×4): qty 1

## 2018-05-31 MED ORDER — IOHEXOL 300 MG/ML  SOLN
75.0000 mL | Freq: Once | INTRAMUSCULAR | Status: AC | PRN
  Administered 2018-05-31: 75 mL via INTRAVENOUS

## 2018-05-31 MED ORDER — FLUTICASONE FUROATE-VILANTEROL 100-25 MCG/INH IN AEPB
1.0000 | INHALATION_SPRAY | Freq: Every day | RESPIRATORY_TRACT | Status: DC
  Administered 2018-05-31 – 2018-06-05 (×5): 1 via RESPIRATORY_TRACT
  Filled 2018-05-31: qty 28

## 2018-05-31 NOTE — Evaluation (Addendum)
Physical Therapy Evaluation Patient Details Name: Michaela Little MRN: [id number] DOB: 08/19/1960 Today's Date: 05/31/2018   History of Present Illness  Pt is a 58 y.o. female admitted 05/30/18 with SOB and chest discomfort. Worked up for afib with RVR. PMH includes anemia (baseline Hgb of 4), asthma, OSA, breast CA with bone metastases.    Clinical Impression  Pt presents with an overall decrease in functional mobility secondary to above. PTA, pt ambulatory with RW, has aide assist M-F for ADLs and household tasks; has been working with [place] services. Today, pt moving with RW at supervision-level; mobility limited by c/o dizziness (see BP values below). Pt would benefit from continued acute PT services to maximize functional mobility and independence prior to d/c with continued HHPT services.  Orthostatic BPs Supine 141/70  Sitting 133/33  Standing 124/67       Follow Up Recommendations Home health PT;Supervision - Intermittent    Equipment Recommendations  None recommended by PT    Recommendations for Other Services       Precautions / Restrictions Precautions Precautions: Fall;Other (comment) Precaution Comments: Low hgb (per chart, baseline 4), no blood products Restrictions Weight Bearing Restrictions: No      Mobility  Bed Mobility Overal bed mobility: Modified Independent                Transfers Overall transfer level: Needs assistance Equipment used: Rolling walker (2 wheeled) Transfers: Sit to/from Stand Sit to Stand: Supervision            Ambulation/Gait Ambulation/Gait assistance: Supervision Gait Distance (Feet): 2 Feet Assistive device: Rolling walker (2 wheeled) Gait Pattern/deviations: Step-to pattern Gait velocity: Decreased   General Gait Details: Took side steps to EOB with RW and supervision. Further mobility deferred secondary to pt c/o dizziness and (+) orthostatic  Stairs            Wheelchair Mobility    Modified Rankin  (Stroke Patients Only)       Balance Overall balance assessment: Needs assistance   Sitting balance-Leahy Scale: Good       Standing balance-Leahy Scale: Poor                               Pertinent Vitals/Pain Pain Assessment: Faces Faces Pain Scale: Hurts a little bit Pain Location: Chest Pain Descriptors / Indicators: Discomfort Pain Intervention(s): Limited activity within patient's tolerance    Home Living Family/patient expects to be discharged to:: Private residence Living Arrangements: Alone Available Help at Discharge: Family;Personal care attendant;Available PRN/intermittently Type of Home: House Home Access: Level entry     Home Layout: One level Home Equipment: Bedside commode;Shower seat;Walker - 2 wheels;Hospital bed;Wheelchair - manual      Prior Function Level of Independence: Needs assistance   Gait / Transfers Assistance Needed: Mod indep with RW; wheelchair if fatigued. Wears 2L O2 [place] at home. Has been working with [facility] since previous admission at [place] / Homemaking Assistance Needed: PCA comes M-F to assist with household tasks and bathing as needed        Hand Dominance        Extremity/Trunk Assessment   Upper Extremity Assessment Upper Extremity Assessment: Overall WFL for tasks assessed    Lower Extremity Assessment Lower Extremity Assessment: Overall WFL for tasks assessed       Communication   Communication: No difficulties  Cognition Arousal/Alertness: Awake/alert Behavior During Therapy: Flat affect Overall Cognitive Status: Within Functional  Limits for tasks assessed                                        General Comments General comments (skin integrity, edema, etc.): Brother present    Exercises     Assessment/Plan    PT Assessment Patient needs continued PT services  PT Problem List Decreased strength;Decreased activity tolerance;Decreased balance;Decreased  mobility;Cardiopulmonary status limiting activity       PT Treatment Interventions DME instruction;Gait training;Functional mobility training;Therapeutic activities;Therapeutic exercise;Balance training;Patient/family education    PT Goals (Current goals can be found in the Care Plan section)  Acute Rehab PT Goals Patient Stated Goal: I don't know why you're getting me up already, I need to rest PT Goal Formulation: With patient Time For Goal Achievement: 06/14/18    Frequency Min 3X/week   Barriers to discharge Decreased caregiver support      Co-evaluation               AM-PAC PT "6 Clicks" Mobility  Outcome Measure Help needed turning from your back to your side while in a flat bed without using bedrails?: None Help needed moving from lying on your back to sitting on the side of a flat bed without using bedrails?: None Help needed moving to and from a bed to a chair (including a wheelchair)?: A Little Help needed standing up from a chair using your arms (e.g., wheelchair or bedside chair)?: A Little Help needed to walk in hospital room?: A Little Help needed climbing 3-5 steps with a railing? : A Little 6 Click Score: 20    End of Session Equipment Utilized During Treatment: Gait belt Activity Tolerance: Treatment limited secondary to medical complications (Comment) Patient left: in bed;with call bell/phone within reach;with family/visitor present Nurse Communication: Mobility status PT Visit Diagnosis: Other abnormalities of gait and mobility (R26.89)    Time: 4235-3614 PT Time Calculation (min) (ACUTE ONLY): 16 min   Charges:   PT Evaluation $PT Eval Moderate Complexity: [place], PT, DPT Acute Rehabilitation Services  Pager [phone] Office [place] 05/31/2018, 3:47 PM

## 2018-05-31 NOTE — Progress Notes (Signed)
  Echocardiogram 2D Echocardiogram has been performed.  Michaela Little 05/31/2018, 2:42 PM

## 2018-05-31 NOTE — Progress Notes (Signed)
Initial Nutrition Assessment  DOCUMENTATION CODES:  Obesity unspecified  INTERVENTION:  Ensure Enlive po BID, each supplement provides 350 kcal and 20 grams of protein  Food requests taken to promote PO intake  NUTRITION DIAGNOSIS:  Increased nutrient needs related to cancer and cancer related treatments as evidenced by estimated nutritional requirements for this condtion  GOAL:  Patient will meet greater than or equal to 90% of their needs  MONITOR:  PO intake, Supplement acceptance, Labs, I & O's, Weight trends  REASON FOR ASSESSMENT:  Consult Assessment of nutrition requirement/status  ASSESSMENT:  58 y/o female PMHx metastatic breast cancer w/ metastases to bone- currently receiving chemo, severe anemia w/ Hgb 4-5. (not amenable to transfusion 2/2 [employer] Witness). Presents w/ chest pain, SOB and heart palpitations. EMS found pot in Afib w/ HR >170. Admitted for management of new Afib.   Pt was recently admitted to [facility] 2/7-2/13. She was seen by RD during that admission and apparently has been admitted there several times this year. Per RDs note, pts intake is variable during admissions. Pt was 235 lbs during most recent [facility] admission. Her present weight is 223.4 lbs, which is a loss of ~5% bw in 1 month.   Upon speaking with pt today, she says she has a great appetite and has not had any issues eating. She eats several times a day at baseline. Regarding her weight loss, pt says she has been trying to eat healthier, avoiding "junk food". That said, she does report losing some weight unintentionally. She says she used to be >280 lbs. This is confirmed from chart, she was ~290 lbs at this time 1 year ago. This loss is >20% bw and meets malnutrition criteria.   Regarding her cancer treatment, she says she is tolerating her monthly injections well and has not felt different since she began them.   At this time, she still reports a good appetite. She would like to continue to receive the  [facility]ure [facility] and says the reason she preferred Boost Breeze before was because she was having a lot of secretions at that time. RD did note that she should not intentionally be trying to lose weight and that weight maintenance is the priority given she is actively being treated for cancer.   Labs: Hgb: 4.3, Bun/Creat:14/1.02, Calcium: 12.4, Albumin: 3.2, K: 3.1->4.0 Meds: Dulcolax, Ensure Enlive, Iron, Folate, miralax, MVI with min, IV Feraheme, IVF    Recent Labs  Lab 05/30/18 1308 05/31/18 0430  NA 137 139  K 3.1* 4.0  CL 105 109  CO2 21* 23  BUN 14 14  CREATININE 0.97 1.02*  CALCIUM 12.8* 12.4*  GLUCOSE 146* 121*   NUTRITION - FOCUSED PHYSICAL EXAM:   Most Recent Value  Orbital Region  No depletion  Upper Arm Region  No depletion  Thoracic and Lumbar Region  Unable to assess  Buccal Region  No depletion  Temple Region  No depletion  Clavicle Bone Region  No depletion  Clavicle and Acromion Bone Region  No depletion  Scapular Bone Region  Unable to assess  Dorsal Hand  No depletion  Patellar Region  No depletion  Anterior Thigh Region  Unable to assess  Posterior Calf Region  No depletion  Edema (RD Assessment)  None  Hair  Reviewed  Eyes  Reviewed  Mouth  Reviewed  Skin  Reviewed  Nails  Reviewed     Diet Order:   Diet Order  Diet regular Room service appropriate? Yes; Fluid consistency: Thin  Diet effective now             EDUCATION NEEDS:  Education needs have been addressed  Skin:  Skin Assessment: Reviewed RN Assessment  Last BM:  3/5  Height:  Ht Readings from Last 1 Encounters:  05/30/18 5\' 7"  (1.702 m)   Weight:  Wt Readings from Last 1 Encounters:  05/31/18 101.3 kg   Wt Readings from Last 10 Encounters:  05/31/18 101.3 kg  05/16/18 102.2 kg  05/15/18 102.5 kg  05/02/18 106.5 kg  05/01/18 106.5 kg  04/26/18 103.2 kg  04/22/18 103.2 kg  04/18/18 103.4 kg  04/08/18 108.9 kg  04/08/18 119.6 kg   Ideal Body Weight:  61.36  kg  BMI:  Body mass index is 34.99 kg/m.  Estimated Nutritional Needs:  Kcal:  1900-2150 kcals (19-21 kcal/kg bw) Protein:  84-98g Pro (1.4-1.6g/kg ibw) Fluid:  1.9-2.2 L fluid (81ml/kcal)  Burtis Junes RD, LDN, CNSC Clinical Nutrition Available Tues-Sat via Pager: [phone] 05/31/2018 2:12 PM

## 2018-05-31 NOTE — Progress Notes (Addendum)
PROGRESS NOTE    [name]  RXV:[id number] DOB: 04-16-1960 DOA: 05/30/2018 PCP: Ladell Pier, MD  Brief Narrative:  58 y.o. female with medical history significant of metastatic breast cancer, pulmonary embolism, DVT, severe anemia, on Lovenox, Jehovah's Witness with hemoglobin at about 4-5, presented to the emergency room with shortness of breath and chest discomfort.  According to the patient, she started this today morning, insidious in onset, felt palpitations and diffuse chest pressure sensation.  She did feel somehow dizzy and shortness of breath but did not pass out.  She is noted to have a heme baseline hemoglobin of 4.  Denies any active blood loss.  No cough, no fever no congestion.  Chest pain is mostly substernal no radiation and mild intensity.  Patient is currently on Lovenox 100 mg twice daily at home. Patient has chronic weakness, fatigue and also has musculoskeletal pain secondary to bony metastasis. ED Course: EMS found her with heart rate of 172 with A. fib.  Her blood pressures were fairly stable in the emergency room.  Hemoglobin is 5.2 which is historically best.  Potassium 3.1.  Renal functions are normal.  Twelve-lead EKG shows A. fib with RVR.  Patient was started on Cardizem infusion in the emergency room.  Assessment & Plan:   Principal Problem:   Atrial fibrillation with RVR (HCC) Active Problems:   Asthma   OSA (obstructive sleep apnea)   Hyperlipidemia   Malignant neoplasm of overlapping sites of left breast in female, estrogen receptor positive (HCC)   GERD (gastroesophageal reflux disease)   Essential hypertension   Bone metastases (HCC)   Refusal of blood transfusions as patient is Jehovah's Witness   Pulmonary embolism (HCC)   New onset a-fib (HCC)   1 A. fib with RVR new onset secondary to severe anemia recent flulike symptoms.  Patient is now off Cardizem drip continue beta-blocker.  She is in and out of A. fib and normal sinus rhythm.   She  had an echo January 2020 with normal ejection fraction  #2 hypercalcemia of malignancy-CORRECTED  calcium with albumin is around 13.  Patient has history of chronic hypercalcemia.  I will continue IV fluids and recheck levels tomorrow.  #3 severe normocytic anemia chronic hemoglobin 4.3.  She has agreed to take IV iron we will start her on IV iron and p.o. iron.  She does not want blood transfusion because of religious believes she is Jehovah witness.  Will ask phlebotomy to use pediatric tubes.  Upon admission her hemoglobin was 5.2 down to 4.3 some of it is secondary to hemodilution.  No signs of active bleeding.  #4 history of PE and DVT looking through her prior records it looks like she was diagnosed with this somewhere around the end of 2019 and she does have an IVC filter in place.  She is on Lovenox at home which will be continued.  She is on 2 L of oxygen at home which will be continued.  #5 metastatic breast cancer with a history of left mastectomy patient has mets to bone and appendix follow-up with Dr. Annamaria Boots..  Read as widespread sclerotic osseous metastatic disease which was not present in the chest x-ray from a month ago.  Will obtain CT of the chest.  Continue IV fluids.   Estimated body mass index is 34.99 kg/m as calculated from the following:   Height as of this encounter: 5\' 7"  (1.702 m).   Weight as of this encounter: 101.3 kg.  DVT prophylaxis:  LOVENOX Code Status: FULL Family Communication: HUSBAND BY THE BED side disposition Plan: Pending clinical improvement   Consultants: None   Procedures: None Antimicrobials: None Subjective: Resting in bed on 2 L of oxygen does not appear to be in any distress concerned that she has maybe mets in her lungs by the chest x-ray She denies shortness of breath or chest pain.  Objective: Vitals:   05/30/18 1823 05/30/18 2052 05/30/18 2238 05/31/18 0427  BP: (!) 127/99 119/64 128/69 134/67  Pulse:  88  75  Resp:  (!) 23  14    Temp:  98.1 F (36.7 C)  98.4 F (36.9 C)  TempSrc:  Oral  Oral  SpO2:  100%  100%  Weight:    101.3 kg  Height:        Intake/Output Summary (Last 24 hours) at 05/31/2018 1011 Last data filed at 05/31/2018 9937 Gross per 24 hour  Intake 1830 ml  Output 1950 ml  Net -120 ml   Filed Weights   05/30/18 1255 05/30/18 1701 05/31/18 0427  Weight: 101.6 kg 99.5 kg 101.3 kg    Examination:  General exam: Appears calm and comfortable  Respiratory system: Clear to auscultation. Respiratory effort normal. Cardiovascular system: S1 & S2 heard, RRR. No JVD, murmurs, rubs, gallops or clicks. No pedal edema. Gastrointestinal system: Abdomen is nondistended, soft and nontender. No organomegaly or masses felt. Normal bowel sounds heard. Central nervous system: Alert and oriented. No focal neurological deficits. Extremities: Trace edema bilaterally Skin: No rashes, lesions or ulcers Psychiatry: Judgement and insight appear normal. Mood & affect appropriate.     Data Reviewed: I have personally reviewed following labs and imaging studies  CBC: Recent Labs  Lab 05/30/18 1308 05/31/18 0430  WBC 6.7 4.1  NEUTROABS 2.8  --   HGB 5.2* 4.3*  HCT 18.6* 15.2*  MCV 100.0 100.7*  PLT 135* 169*   Basic Metabolic Panel: Recent Labs  Lab 05/30/18 1308 05/31/18 0430  NA 137 139  K 3.1* 4.0  CL 105 109  CO2 21* 23  GLUCOSE 146* 121*  BUN 14 14  CREATININE 0.97 1.02*  CALCIUM 12.8* 12.4*   GFR: Estimated Creatinine Clearance: 71.8 mL/min (A) (by C-G formula based on SCr of 1.02 mg/dL (H)). Liver Function Tests: Recent Labs  Lab 05/30/18 1308  AST 31  ALT 17  ALKPHOS 127*  BILITOT 1.1  PROT 7.0  ALBUMIN 3.2*   No results for input(s): LIPASE, AMYLASE in the last 168 hours. No results for input(s): AMMONIA in the last 168 hours. Coagulation Profile: No results for input(s): INR, PROTIME in the last 168 hours. Cardiac Enzymes: Recent Labs  Lab 05/30/18 1836 05/30/18 2303  05/31/18 0430  TROPONINI <0.03 <0.03 <0.03   BNP (last 3 results) No results for input(s): PROBNP in the last 8760 hours. HbA1C: No results for input(s): HGBA1C in the last 72 hours. CBG: No results for input(s): GLUCAP in the last 168 hours. Lipid Profile: No results for input(s): CHOL, HDL, LDLCALC, TRIG, CHOLHDL, LDLDIRECT in the last 72 hours. Thyroid Function Tests: No results for input(s): TSH, T4TOTAL, FREET4, T3FREE, THYROIDAB in the last 72 hours. Anemia Panel: No results for input(s): VITAMINB12, FOLATE, FERRITIN, TIBC, IRON, RETICCTPCT in the last 72 hours. Sepsis Labs: No results for input(s): PROCALCITON, LATICACIDVEN in the last 168 hours.  No results found for this or any previous visit (from the past 240 hour(s)).       Radiology Studies: Dg Chest 2 View  Result  Date: 05/30/2018 CLINICAL DATA:  Dyspnea, chest pain EXAM: CHEST - 2 VIEW COMPARISON:  05/16/2018 chest radiograph. FINDINGS: Surgical clips overlie the left axilla. Stable cardiomediastinal silhouette with heart size. No pneumothorax. No pleural effusion. No overt pulmonary edema. No acute consolidative airspace disease. Stable widespread sclerotic osseous metastatic disease. IMPRESSION: No active cardiopulmonary disease. 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 Electronically Signed   By: Ilona Sorrel M.D.   On: 05/30/2018 14:21        Scheduled Meds: . bisacodyl  10 mg Oral Daily  . carvedilol  12.5 mg Oral BID WC  . enoxaparin  100 mg Subcutaneous Q12H  . feeding supplement (ENSURE ENLIVE)  237 mL Oral TID BM  . ferrous sulfate  325 mg Oral TID WC  . fluticasone  1 spray Each Nare Daily  . fluticasone furoate-vilanterol  1 puff Inhalation Daily  . folic acid  1 mg Oral Daily  . hydrALAZINE  10 mg Oral TID  . multivitamin with minerals  1 tablet Oral Q M,W,F  . polyethylene glycol  17 g Oral Daily   Continuous Infusions: . sodium chloride 125 mL/hr at 05/31/18 0551  .  diltiazem (CARDIZEM) infusion Stopped (05/31/18 0305)  . ferumoxytol       LOS: 1 day     Georgette Shell, MD [facility]  If 7PM-7AM, please contact night-coverage [email or web address] Password [email or web address] 05/31/2018, 10:11 AM

## 2018-05-31 NOTE — Plan of Care (Signed)
  Problem: Clinical Measurements: Goal: Ability to maintain clinical measurements within normal limits will improve Outcome: Progressing Goal: Diagnostic test results will improve Outcome: Progressing   

## 2018-06-01 DIAGNOSIS — Z515 Encounter for palliative care: Secondary | ICD-10-CM

## 2018-06-01 LAB — CBC WITH DIFFERENTIAL/PLATELET
Abs Immature Granulocytes: 0.11 10*3/uL — ABNORMAL HIGH (ref 0.00–0.07)
Basophils Absolute: 0 10*3/uL (ref 0.0–0.1)
Basophils Relative: 0 %
Eosinophils Absolute: 0.1 10*3/uL (ref 0.0–0.5)
Eosinophils Relative: 3 %
HCT: 14.3 % — ABNORMAL LOW (ref 36.0–46.0)
Hemoglobin: 4 g/dL — CL (ref 12.0–15.0)
Immature Granulocytes: 3 %
LYMPHS PCT: 26 %
Lymphs Abs: 0.9 10*3/uL (ref 0.7–4.0)
MCH: 27.6 pg (ref 26.0–34.0)
MCHC: 28 g/dL — ABNORMAL LOW (ref 30.0–36.0)
MCV: 98.6 fL (ref 80.0–100.0)
Monocytes Absolute: 0.4 10*3/uL (ref 0.1–1.0)
Monocytes Relative: 11 %
Neutro Abs: 1.9 10*3/uL (ref 1.7–7.7)
Neutrophils Relative %: 57 %
PLATELETS: 99 10*3/uL — AB (ref 150–400)
RBC: 1.45 MIL/uL — ABNORMAL LOW (ref 3.87–5.11)
RDW: 20.6 % — ABNORMAL HIGH (ref 11.5–15.5)
WBC: 3.4 10*3/uL — ABNORMAL LOW (ref 4.0–10.5)
nRBC: 8.4 % — ABNORMAL HIGH (ref 0.0–0.2)

## 2018-06-01 LAB — URINE CULTURE: Culture: NO GROWTH

## 2018-06-01 LAB — COMPREHENSIVE METABOLIC PANEL
ALT: 14 U/L (ref 0–44)
AST: 29 U/L (ref 15–41)
Albumin: 2.6 g/dL — ABNORMAL LOW (ref 3.5–5.0)
Alkaline Phosphatase: 104 U/L (ref 38–126)
Anion gap: 4 — ABNORMAL LOW (ref 5–15)
BUN: 12 mg/dL (ref 6–20)
CHLORIDE: 108 mmol/L (ref 98–111)
CO2: 26 mmol/L (ref 22–32)
Calcium: 12.7 mg/dL — ABNORMAL HIGH (ref 8.9–10.3)
Creatinine, Ser: 0.93 mg/dL (ref 0.44–1.00)
GFR calc Af Amer: >60 mL/min (ref >60)
GFR calc non Af Amer: >60 mL/min (ref >60)
Glucose, Bld: 100 mg/dL — ABNORMAL HIGH (ref 70–99)
Potassium: 3.7 mmol/L (ref 3.5–5.1)
Sodium: 138 mmol/L (ref 135–145)
Total Bilirubin: 1.3 mg/dL — ABNORMAL HIGH (ref 0.3–1.2)
Total Protein: 5.7 g/dL — ABNORMAL LOW (ref 6.5–8.1)

## 2018-06-01 MED ORDER — DARBEPOETIN ALFA 100 MCG/0.5ML IJ SOSY
500.0000 ug | PREFILLED_SYRINGE | Freq: Once | INTRAMUSCULAR | Status: AC
  Administered 2018-06-01: 500 ug via SUBCUTANEOUS
  Filled 2018-06-01 (×2): qty 2.5

## 2018-06-01 MED ORDER — ENOXAPARIN SODIUM 100 MG/ML ~~LOC~~ SOLN
100.0000 mg | Freq: Two times a day (BID) | SUBCUTANEOUS | Status: DC
  Administered 2018-06-01 – 2018-06-06 (×8): 100 mg via SUBCUTANEOUS
  Filled 2018-06-01 (×10): qty 1

## 2018-06-01 NOTE — Consult Note (Signed)
Palliative Medicine   Name: Michaela Little Date: 06/01/2018 MRN: [id number]  DOB: [birth year]  Patient Care Team: Ladell Pier, MD as PCP - General (Internal Medicine) [name], MD as Consulting Physician (Pulmonary Disease) [name], MD as Consulting Physician (Hematology) [name], NP as Nurse Practitioner (Hospice and Palliative Medicine) Clent Demark, PA-C as Physician Assistant (Physician Assistant)    REASON FOR CONSULTATION: Palliative Care consult requested for this 58 y.o. female with multiple medical problems including stage IV breast cancer, history of pulmonary embolism and DVT on Lovenox, Jehovah's Witness with chronic severe anemia, who is admitted to the hospital 05/30/2018 with rapid A. fib.    SOCIAL HISTORY:     reports that she quit smoking about 30 years ago. Her smoking use included cigarettes. She has a 3.00 pack-year smoking history. She has never used smokeless tobacco. She reports that she does not drink alcohol or use drugs.   Patient lives at home alone.  She has an aide that comes for 2 hours.  Patient has siblings and adult-children who are involved in her care.  ADVANCE DIRECTIVES:  Does not have  CODE STATUS: Full code  PAST MEDICAL HISTORY: Past Medical History:  Diagnosis Date  . Anxiety   . Arthritis    hands, knees, hips  . Asthma   . Bronchitis   . Cancer ([place])   . Cervical stenosis (uterine cervix)   . Disorder of appendix    Enlarged  . Dyspnea on exertion   . GERD (gastroesophageal reflux disease)   . Headache(784.0)   . History of breast cancer    2010--  LEFT  s/p  mastectomy (in [place]) AND CHEMORADIATION--  NO RECURRENCE  . History of cervical dysplasia   . Hyperlipidemia   . Hypertension   . Malignant neoplasm of overlapping sites of left breast in female, estrogen receptor positive ([place]) 03/05/2013  . OSA (obstructive sleep apnea) moderate osa per study  09/2010   CPAP  , NOT USING ON REGULAR BASIS    . Pelvic pain in female   . Positive H. pylori test    08-05-2013  . Positive TB test    AS TEEN--  [place] W/ MEDS  . Refusal of blood transfusions as patient is Jehovah's Witness   . Seasonal allergies   . Uterine fibroid   . Wears glasses     PAST SURGICAL HISTORY:  Past Surgical History:  Procedure Laterality Date  . APPENDECTOMY  y  . CARDIOVASCULAR STRESS TEST  10-29-2012   low risk perfusion study/  no significant reversibity/ ef 66%/  normal wall motion  . CERVICAL CONIZATION W/BX  2012   in  [place] N/A 05/14/2012   Procedure: LAPAROSCOPIC CHOLECYSTECTOMY;  Surgeon: Ralene Ok, MD;  Location: [facility];  Service: General;  Laterality: N/A;  . COLONOSCOPY  2011   normal per patient - [place]  . DILATION AND CURETTAGE OF UTERUS  10/15/2011   Procedure: DILATATION AND CURETTAGE;  Surgeon: Melina Schools, MD;  Location: [facility];  Service: Gynecology;  Laterality: N/A;  Conization &  endocervical curettings  . EXAMINATION UNDER ANESTHESIA N/A 08/20/2013   Procedure: EXAM UNDER ANESTHESIA;  Surgeon: Margarette Asal, MD;  Location: [facility];  Service: Gynecology;  Laterality: N/A;  . [place]   right  . IR IVC FILTER PLMT / S&I /IMG GUID/MOD SED  03/01/2018  . IR RADIOLOGIST EVAL & MGMT  04/08/2018  .  LAPAROSCOPIC APPENDECTOMY N/A 08/22/2017   Procedure: [place];  Surgeon: Coralie Keens, MD;  Location: [place];  Service: General;  Laterality: N/A;  . LAPAROSCOPIC ASSISTED VAGINAL HYSTERECTOMY N/A 10/26/2014   Procedure: HYSTERECTOMY ABDOMINAL ;  Surgeon: Molli Posey, MD;  Location: [facility];  Service: Gynecology;  Laterality: N/A;  . MASTECTOMY Left 11/2008  in [place]   . SALPINGOOPHORECTOMY Bilateral 10/26/2014   Procedure: BILATERAL SALPINGO OOPHORECTOMY;  Surgeon: Molli Posey, MD;  Location: [facility];  Service: Gynecology;  Laterality: Bilateral;  . TISSUE EXPANDER PLACEMENT Left 08/22/2017    Procedure: REMOVAL OF LEFT TISSUE EXPANDER;  Surgeon: Crissie Reese, MD;  Location: [place];  Service: Plastics;  Laterality: Left;  . TISSUE EXPANDER REMOVAL Left 08/22/2017  . 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 breast in female, estrogen receptor positive ([place])   05/2008 Cancer Diagnosis    Left sided breast cancer (no path report available)    11/2008 Pathologic Stage    Stage IIIA: T2 N2     Neo-Adjuvant Chemotherapy    Paclitaxel weekly x 12; doxorubicin and cyclophosphamide x 4 - both given with trifiparnib (treated at [facility] in [place])    11/2008 Definitive Surgery    Left modified radiation mastectomy: invasive lobular carcinoma, ER+, PR+, HER2/neu negative, 5/22 LN positive     - 03/2009 Radiation Therapy    Adjuvant radiation to left chest wall    2011 - 08/2014 Anti-estrogen oral therapy    Tamoxifen 20 mg used briefly; discontinued due to uterine lining concerns; changed to anastrozole 1 mg daily (began 07/2100); discontinued by patient summer of 2016    05/26/2015 Progression    Iliac bone biopsy showed metastatic carcinoma consistent with a breast primary, ER positive, PR and HER-2 negative. Her staging CT, and a PET scan was negative for visceral metastasis.    05/2015 - 01/2018 Anti-estrogen oral therapy    1. letrozole and Ibrance started March 2017             -Ibrance dose decreased to 75 mg daily. Stopped in 11/2017 due to anemia   -Letrozole switched to Exemestane on 02/13/17 due to b/l rib pain. She exemestane herself in 01/2018.      11/16/2015 Miscellaneous    Xgeva monthly  for bone metastasis     07/03/2016 Imaging    CT chest, abdomen and pelvis with contrast showed no evidence of metastasis.     02/07/2017 Imaging    CT AP W Contrast 02/07/17 IMPRESSION: 1. No CT findings for abdominal/pelvic metastatic disease. 2. No acute abdominal findings, mass lesions or adenopathy. 3. Status post cholecystectomy with mild associated common bile duct dilatation. 4. Somewhat thickened appendix appears relatively stable. No acute inflammatory process. 5. Stable mixed lytic and sclerotic osseous metastatic disease.     02/07/2017 Imaging    Bone Scan Whole Body 02/07/17 IMPRESSION: No scintigraphic evidence skeletal metastasis     08/12/2017 Imaging    Whole [place] Scan 08/12/17 IMPRESSION: Scattered degenerative type uptake as above with a questionable focus of increased  tracer localization at L2 vertebral body versus artifact; no abnormality is seen at this site by CT. Consider either characterization by MR or attention on follow-up imaging.    08/12/2017 Imaging    CT AP W Contrast 08/12/17  IMPRESSION: Continued thickening of the appendix is noted without surrounding inflammation. It has maximum measured diameter is decreased compared to prior exam. There is no evidence of acute inflammation. Stable mixed lytic and sclerotic appearance of visualized skeleton is noted consistent with history of known osseous metastases. No acute abnormality seen in the abdomen or pelvis.    08/22/2017 Surgery    APPENDECTOMY LAPAROSCOPIC ERAS PATHWAY by Dr. Malcolm Metro and REMOVAL OF LEFT TISSUE EXPANDER by Dr. Harlow Mares 08/22/17    08/22/2017 Pathology Results    Diagnosis 08/22/17  1. Appendix, Other than Incidental - METASTATIC CARCINOMA, CONSISTENT WITH BREAST PRIMARY. - CARCINOMA IS PRESENT AT THE SURGICAL RESECTION MARGIN AS WELL AS THE SEROSAL SURFACE. - LYMPHOVASCULAR INVASION IS IDENTIFIED. - SEE COMMENT. 2. Breast, capsule, Left - DENSE FIBROUS TISSUE WITH MILD  INFLAMMATION, INCLUDING A FEW SCATTERED MULTINUCLEATED GIANT CELLS. - THERE IS NO EVIDENCE OF MALIGNANCY.    08/27/2017 Imaging    CT AP W Contrast 08/27/17 IMPRESSION: No evidence of pulmonary embolus. Small pericardial effusion with uncertain clinical significance. No evidence of acute abnormalities within the chest, abdomen or pelvis. Area of fat stranding and small focus of gas within the anterior abdominal wall, immediately inferior to the umbilicus, probably at the site of laparoscopic access.    10/07/2017 Echocardiogram    10/07/2017 ECO Study Conclusions  - Procedure narrative: Transthoracic echocardiography. Image   quality was adequate. The study was technically difficult, as a   result of poor acoustic windows. - Left ventricle: The cavity size was normal. Wall thickness was   normal. Systolic function was normal. The estimated ejection   fraction was in the range of 60% to 65%. Wall motion was normal;   there were no regional wall motion abnormalities. Features are   consistent with a pseudonormal left ventricular filling pattern,   with concomitant abnormal relaxation and increased filling   pressure (grade 2 diastolic dysfunction).    01/16/2018 PET scan    01/16/2018 PET Scan IMPRESSION: 1. Most striking finding is intense marrow activity diffusely throughout the axillary and appendicular skeleton. This is favored a physiologic marrow response from chronic anemia rather than diffuse metastatic disease. 2. Several discrete foci of more intense activity noted in the spine which could indicate malignancy or trauma. Difficult to define malignancy on the background of diffuse marrow activity. 3. Diffuse sclerosis throughout the bones similar to a recent CT scans but new from remote PET-CT scan 03/01/2016. 4. No evidence soft tissue metastasis.     04/02/2018 Imaging    CT Angio CAP 04/02/18  IMPRESSION: 1. Multiple new hepatic lesions highly concerning for  developing metastatic disease to the liver. This could be confirmed with nonemergent MRI of the abdomen with and without IV gadolinium if clinically appropriate. 2. Widespread metastatic disease to the bones also appears progressive compared to prior examinations. 3. Large right-sided pelvic deep venous thrombosis extending from the right common femoral vein to the proximal right external iliac vein. IVC filter is in position. No clot identified in the IVC filter at this time. No pulmonary embolism on today's examination. 4. Aortic atherosclerosis. 5. Additional incidental findings, as above.    05/01/2018 -  Anti-estrogen oral therapy    Fulvestrant injections 536m monthly starting 05/01/18  ALLERGIES:  is allergic to lisinopril-hydrochlorothiazide; adhesive [tape]; fentanyl; gabapentin; hctz [hydrochlorothiazide]; latex; lisinopril; losartan potassium; other; oxycodone; and tums [calcium carbonate antacid].  MEDICATIONS:  Current Facility-Administered Medications  Medication Dose Route Frequency Provider Last Rate Last Dose  . 0.9 %  sodium chloride infusion   Intravenous Continuous Georgette Shell, MD 75 mL/hr at 06/01/18 1040    . acetaminophen (TYLENOL) tablet 500 mg  500 mg Oral Q6H PRN Barb Merino, MD      . albuterol (PROVENTIL) (2.5 MG/3ML) 0.083% nebulizer solution 2.5 mg  2.5 mg Nebulization Q2H PRN Barb Merino, MD      . bisacodyl (DULCOLAX) EC tablet 10 mg  10 mg Oral Daily Georgette Shell, MD   10 mg at 06/01/18 0816  . carvedilol (COREG) tablet 12.5 mg  12.5 mg Oral BID WC Barb Merino, MD   12.5 mg at 06/01/18 0815  . diltiazem (CARDIZEM) 100 mg in dextrose 5% 181m (1 mg/mL) infusion  5-15 mg/hr Intravenous Continuous GBarb Merino MD   Stopped at 05/31/18 0305  . enoxaparin (LOVENOX) injection 100 mg  100 mg Subcutaneous Q12H MGeorgette Shell MD      . feeding supplement (ENSURE ENLIVE) (ENSURE ENLIVE) liquid 237 mL  237 mL Oral TID BM GBarb Merino MD   237 mL at 06/01/18 1353  . ferrous sulfate tablet 325 mg  325 mg Oral TID WC MGeorgette Shell MD   325 mg at 06/01/18 1352  . fluticasone (FLONASE) 50 MCG/ACT nasal spray 1 spray  1 spray Each Nare Daily Ghimire, Kuber, MD      . fluticasone furoate-vilanterol (BREO ELLIPTA) 100-25 MCG/INH 1 puff  1 puff Inhalation Daily GBarb Merino MD   1 puff at [id number] . folic acid (FOLVITE) tablet 1 mg  1 mg Oral Daily GBarb Merino MD   1 mg at 06/01/18 0815  . hydrALAZINE (APRESOLINE) tablet 10 mg  10 mg Oral TID GBarb Merino MD   10 mg at 06/01/18 0815  . ipratropium-albuterol (DUONEB) 0.5-2.5 (3) MG/3ML nebulizer solution 3 mL  3 mL Nebulization Q6H PRN GBarb Merino MD      . multivitamin with minerals tablet 1 tablet  1 tablet Oral Q M,W,F GBarb Merino MD   1 tablet at 05/30/18 1823  . polyethylene glycol (MIRALAX / GLYCOLAX) packet 17 g  17 g Oral Daily MGeorgette Shell MD   17 g at 06/01/18 1353  . traMADol (ULTRAM) tablet 50 mg  50 mg Oral Q6H PRN GBarb Merino MD   50 mg at 05/31/18 2036    VITAL SIGNS: BP (!) 128/55 (BP Location: Right Arm)   Pulse 74   Temp 98.7 F (37.1 C) (Oral)   Resp (!) 42   Ht 5' 7"  (1.702 m)   Wt 103.1 kg   LMP 02/25/2009   SpO2 99%   BMI 35.60 kg/m  Filed Weights   05/30/18 1701 05/31/18 0427 06/01/18 0444  Weight: 99.5 kg 101.3 kg 103.1 kg    Estimated body mass index is 35.6 kg/m as calculated from the following:   Height as of this encounter: 5' 7"  (1.702 m).   Weight as of this encounter: 103.1 kg.  LABS: CBC:    Component Value Date/Time   WBC 3.4 (L) 06/01/2018 0445   HGB 4.0 (LL) 06/01/2018 0445   HGB 4.9 (LL) 02/11/2018 1109   HGB 4.2 (LL) 12/31/2017 1154   HGB 11.8 03/29/2017 1048   HCT 14.3 (L) 06/01/2018 0445  HCT 13.1 (LL) 12/31/2017 1154   HCT 36.0 03/29/2017 1048   PLT 99 (L) 06/01/2018 0445   PLT 103 (L) 02/11/2018 1109   PLT 108 (L) 12/31/2017 1154   MCV 98.6 06/01/2018 0445   MCV 87  12/31/2017 1154   MCV 91.0 03/29/2017 1048   NEUTROABS 1.9 06/01/2018 0445   NEUTROABS 1.0 (L) 12/31/2017 1154   NEUTROABS 1.5 03/29/2017 1048   LYMPHSABS 0.9 06/01/2018 0445   LYMPHSABS 1.1 12/31/2017 1154   LYMPHSABS 0.8 (L) 03/29/2017 1048   MONOABS 0.4 06/01/2018 0445   MONOABS 0.1 03/29/2017 1048   EOSABS 0.1 06/01/2018 0445   EOSABS 0.0 12/31/2017 1154   BASOSABS 0.0 06/01/2018 0445   BASOSABS 0.0 12/31/2017 1154   BASOSABS 0.0 03/29/2017 1048   Comprehensive Metabolic Panel:    Component Value Date/Time   NA 138 06/01/2018 0445   NA 139 03/29/2017 1048   K 3.7 06/01/2018 0445   K 3.9 03/29/2017 1048   CL 108 06/01/2018 0445   CL 105 07/07/2012 1327   CO2 26 06/01/2018 0445   CO2 23 03/29/2017 1048   BUN 12 06/01/2018 0445   BUN 16.3 03/29/2017 1048   CREATININE 0.93 06/01/2018 0445   CREATININE 0.78 05/16/2018 1336   CREATININE 0.9 03/29/2017 1048   GLUCOSE 100 (H) 06/01/2018 0445   GLUCOSE 117 03/29/2017 1048   GLUCOSE 104 (H) 07/07/2012 1327   CALCIUM 12.7 (H) 06/01/2018 0445   CALCIUM 8.9 03/29/2017 1048   AST 29 06/01/2018 0445   AST 23 05/16/2018 1336   AST 9 03/29/2017 1048   ALT 14 06/01/2018 0445   ALT 8 05/16/2018 1336   ALT <6 03/29/2017 1048   ALKPHOS 104 06/01/2018 0445   ALKPHOS 109 03/29/2017 1048   BILITOT 1.3 (H) 06/01/2018 0445   BILITOT 1.2 05/16/2018 1336   BILITOT 0.72 03/29/2017 1048   PROT 5.7 (L) 06/01/2018 0445   PROT 7.5 03/29/2017 1048   ALBUMIN 2.6 (L) 06/01/2018 0445   ALBUMIN 3.7 03/29/2017 1048    RADIOGRAPHIC STUDIES: Dg Chest 2 View  Result Date: 05/30/2018 CLINICAL DATA:  Dyspnea, chest pain EXAM: CHEST - 2 VIEW COMPARISON:  05/16/2018 chest radiograph. FINDINGS: Surgical clips overlie the left axilla. Stable cardiomediastinal silhouette with heart size. No pneumothorax. No pleural effusion. No overt pulmonary edema. No acute consolidative airspace disease. Stable widespread sclerotic osseous metastatic disease.  IMPRESSION: No active cardiopulmonary disease. 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 Electronically Signed   By: Ilona Sorrel M.D.   On: 05/30/2018 14:21   Dg Chest 2 View  Result Date: 05/16/2018 CLINICAL DATA:  History of breast cancer, pancytopenia EXAM: CHEST - 2 VIEW COMPARISON:  05/06/2018, 05/04/2018, 05/02/2018 FINDINGS: Persistent, unchanged in interstitial edema or inflammatory process. No focal consolidation or pleural effusion. Stable cardiomediastinal silhouette. No pneumothorax. Surgical clips in the left axilla. Heterogenous lucencies at both shoulders, unchanged. IMPRESSION: 1. No significant interval change in mild interstitial edema or inflammatory process. No focal pneumonia. 2. Skeletal metastatic disease Electronically Signed   By: Donavan Foil M.D.   On: 05/16/2018 23:18   Dg Chest 2 View  Result Date: 05/06/2018 CLINICAL DATA:  Acute onset of dizziness, generalized weakness and shortness of breath. EXAM: CHEST - 2 VIEW COMPARISON:  05/04/2018 and earlier. FINDINGS: AP SEMI-ERECT and LATERAL images were obtained. Cardiac silhouette mildly enlarged, unchanged. Hilar and mediastinal contours otherwise unremarkable. Interstitial opacities throughout both lungs are unchanged since the examination 2 days ago. No new pulmonary parenchymal abnormalities.  No visible pleural effusions. Degenerative changes involving the thoracic spine. IMPRESSION: Stable interstitial pulmonary edema versus interstitial pneumonia throughout both lungs when compared to the examination 2 days ago. No new abnormalities. Electronically Signed   By: Evangeline Dakin M.D.   On: 05/06/2018 12:29   Dg Chest 2 View  Result Date: 05/02/2018 CLINICAL DATA:  Initial evaluation for acute fever, productive cough. EXAM: CHEST - 2 VIEW COMPARISON:  Prior radiograph from 04/25/2018 FINDINGS: Cardiac and mediastinal silhouettes are stable in size and contour, and remain within normal limits. Lungs  normally inflated. Patchy infiltrate overlies the mid right lung base, concerning for infiltrate given the provided history of cough and fever. Few scattered additional opacities at the left lung base as well. No edema or effusion. No pneumothorax. Surgical clips overlie the left axilla.  No acute osseous finding. IMPRESSION: Scattered multifocal patchy bibasilar opacities, suspicious for possible infectious infiltrates given provided history of cough and fever. Electronically Signed   By: Jeannine Boga M.D.   On: 05/02/2018 23:25   Dg Abdomen 1 View  Result Date: 05/02/2018 CLINICAL DATA:  Initial evaluation for constipation. EXAM: ABDOMEN - 1 VIEW COMPARISON:  Prior CT from 04/02/2018 FINDINGS: Bowel gas pattern is nonobstructive. No abnormal bowel wall thickening. Large volume retained stool within the rectal vault/distal rectosigmoid colon. No abnormal calcification. IVC filter and cholecystectomy clips noted. 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 IMPRESSION: 1. Large volume retained stool within the distal rectosigmoid colon/rectal vault, compatible with constipation. 2. 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 Electronically Signed   By: Jeannine Boga M.D.   On: 05/02/2018 23:27   Ct Chest W Contrast  Result Date: 05/31/2018 CLINICAL DATA:  Evaluate malignancy, spinal metastatic disease EXAM: CT CHEST WITH CONTRAST TECHNIQUE: Multidetector CT imaging of the chest was performed during intravenous contrast administration. CONTRAST:  58m OMNIPAQUE IOHEXOL 300 MG/ML  SOLN COMPARISON:  CT abdomen pelvis, 05/03/2018, CT chest, 04/02/2018 FINDINGS: Cardiovascular: No significant vascular findings. Normal heart size. No pericardial effusion. Mediastinum/Nodes: No enlarged mediastinal, hilar, or axillary lymph  nodes. Thyroid gland, trachea, and esophagus demonstrate no significant findings. Lungs/Pleura: Lungs are clear. No pleural effusion or pneumothorax. Upper Abdomen: Numerous low-attenuation ill-defined lesions of the liver. Musculoskeletal: Status post left mastectomy. There is diffuse, mixed lytic and sclerotic osseous metastatic disease involving all included osseous structures, particularly conspicuous at T7 (series 7, image 86). IMPRESSION: 1.  No CT abnormality of the lungs or intrathoracic organs. 2. Diffuse osseous metastatic disease, particularly conspicuous at T7 with a large lytic lesion, concerning for pathologic fracture risk. 3. Hepatic metastatic disease in the partially included upper abdomen. 4.  Status post left mastectomy. Electronically Signed   By: AEddie CandleM.D.   On: 05/31/2018 13:12   Dg Chest Port 1 View  Result Date: 05/04/2018 CLINICAL DATA:  Shortness of breath. EXAM: PORTABLE CHEST 1 VIEW COMPARISON:  Chest radiograph 05/02/2018 FINDINGS: Monitoring leads overlie the patient. Stable cardiomegaly. Mild bilateral interstitial opacities bilaterally. No pleural effusion or pneumothorax. Read demonstrated osseous metastatic disease. IMPRESSION: Mild bilateral interstitial opacities may represent edema or atypical infectious process. Electronically Signed   By: DLovey NewcomerM.D.   On: 05/04/2018 13:59   Ct Renal Stone Study  Result Date: 05/03/2018 CLINICAL DATA:  Abdominal pain.  Sepsis.  History of breast cancer. EXAM: CT ABDOMEN AND PELVIS WITHOUT CONTRAST TECHNIQUE: Multidetector CT imaging of the abdomen  and pelvis was performed following the standard protocol without IV contrast. COMPARISON:  CT scan 04/02/2017 FINDINGS: Lower chest: Patchy bibasilar infiltrates suspicious for pneumonia or aspiration. No pleural effusion. Heart is normal in size. No pericardial effusion. Hepatobiliary: Several vague ill-defined low-attenuation liver lesions are noted and highly suspicious for  metastatic disease. Significant progression when compared to prior study from chest 1 month ago. The gallbladder is surgically absent. Mild stable common bile duct dilatation. Pancreas: No mass, inflammation or ductal dilatation. Spleen: Mild splenomegaly.  No definite splenic lesions. Adrenals/Urinary Tract: Thickened adrenal glands appears stable. No obvious lesions. There is a small lower pole right renal calculus but no obstructing ureteral calculi or bladder calculi. Lower pole right renal cyst is noted. The bladder is unremarkable. Stomach/Bowel: The stomach, duodenum, small bowel and colon are grossly normal. No acute inflammatory changes, mass lesions or obstructive findings. There is a large amount of stool throughout the colon and down into the rectum suggesting constipation. Vascular/Lymphatic: Scattered distal aortic and common iliac artery calcifications but no aneurysm. An IVC filter is noted. No mesenteric or retroperitoneal mass or lymphadenopathy. Reproductive: Surgically absent. Other: No free pelvic fluid collections or pelvic adenopathy. No inguinal adenopathy the. Musculoskeletal: Stable appearing diffuse osseous metastatic disease. No obvious pathologic fracture or spinal canal compromise. Left mastectomy noted. IMPRESSION: 1. No acute abdominal/pelvic findings. 2. Patchy bibasilar infiltrates. 3. Significant progression of hepatic metastatic disease since prior CT scan from 1 month ago. 4. Stable diffuse osseous metastatic disease. 5. Status post cholecystectomy with stable mild common bile duct dilatation. 6. Large amount of stool throughout the colon and down into the rectum suggesting constipation. Electronically Signed   By: Marijo Sanes M.D.   On: 05/03/2018 10:01   Vas [place] Lower Extremity Venous (dvt)  Result Date: 05/06/2018  Lower Venous Study Indications: Swelling.  Risk Factors: DVT H/O 05/02/2017 obesity. Performing Technologist: Toma Copier RVS  Examination Guidelines: A  complete evaluation includes B-mode imaging, spectral Doppler, color Doppler, and power Doppler as needed of all accessible portions of each vessel. Bilateral testing is considered an integral part of a complete examination. Limited examinations for reoccurring indications may be performed as noted.  Right Venous Findings: +---------+---------------+---------+-----------+----------+-------------------+          CompressibilityPhasicitySpontaneityPropertiesSummary             +---------+---------------+---------+-----------+----------+-------------------+ CFV      Partial                 Yes                  Acute DVT resolving +---------+---------------+---------+-----------+----------+-------------------+ SFJ      Partial                                      Acute DVT resolving +---------+---------------+---------+-----------+----------+-------------------+ FV Prox  Full           Yes      Yes                  DVT resolved        +---------+---------------+---------+-----------+----------+-------------------+ FV Mid   Full                                                             +---------+---------------+---------+-----------+----------+-------------------+  FV DistalFull           Yes      Yes                  DVT resolved        +---------+---------------+---------+-----------+----------+-------------------+ PFV      Full           Yes      Yes                  DVT resolved        +---------+---------------+---------+-----------+----------+-------------------+ POP      Full           Yes      Yes                                      +---------+---------------+---------+-----------+----------+-------------------+ PTV                                                   Unable to visualize                                                       well eough for                                                            optimal evaluation.  +---------+---------------+---------+-----------+----------+-------------------+ PERO                                                  Unable to visualize                                                       well enough for                                                           optimal evaluation  +---------+---------------+---------+-----------+----------+-------------------+ GSV      Partial                 Yes                  Acute SVT resolving +---------+---------------+---------+-----------+----------+-------------------+  Right Technical Findings: Not visualized segments include peroneal and posterior tbial due to body habitus.  Left Venous Findings: +---------+---------------+---------+-----------+----------+-------------------+          CompressibilityPhasicitySpontaneityPropertiesSummary             +---------+---------------+---------+-----------+----------+-------------------+ CFV  Full                                                             +---------+---------------+---------+-----------+----------+-------------------+ SFJ      Full                                                             +---------+---------------+---------+-----------+----------+-------------------+ FV Prox  Full                                                             +---------+---------------+---------+-----------+----------+-------------------+ FV Mid   Full                                                             +---------+---------------+---------+-----------+----------+-------------------+ FV DistalFull                                                             +---------+---------------+---------+-----------+----------+-------------------+ PFV      Full                                                             +---------+---------------+---------+-----------+----------+-------------------+ POP      Full                                                              +---------+---------------+---------+-----------+----------+-------------------+ PTV                                                   Unable to fully                                                           visualize for  optimal evaluation  +---------+---------------+---------+-----------+----------+-------------------+ PERO                                                  Unable to fully                                                           visualize well                                                            enough for optimal                                                        evaluation          +---------+---------------+---------+-----------+----------+-------------------+  Left Technical Findings: Not visualized segments include peroneal and posterior tibial due to body habitus.   Summary: Right: Findings consistent with acute deep vein thrombosis involving the right common femoral vein. Findings consistent with acute superficial vein thrombosis involving the right great saphenous vein. No cystic structure found in the popliteal fossa. Resolving DVT and SVT Left: There is no evidence of deep vein thrombosis in the lower extremity. No cystic structure found in the popliteal fossa.  *See table(s) above for measurements and observations. Electronically signed by Deitra Mayo MD on 05/06/2018 at 4:54:23 PM.    Final     PERFORMANCE STATUS (ECOG) : 2 - Symptomatic, <50% confined to bed  REVIEW OF SYSTEMS: As noted above. Otherwise, a complete review of systems is negative.  PHYSICAL EXAM: General: NAD, frail appearing Cardiovascular: regular rate and rhythm Pulmonary: clear ant fields Abdomen: soft, nontender, + bowel sounds GU: no suprapubic tenderness Extremities: no edema Skin: no rashes Neurological: Weakness but otherwise  nonfocal  IMPRESSION: I met with patient to discuss goals.  Brother and sister were also in the room during my conversation.  Patient says that at baseline, she lives at home alone.  She has an aide that comes to help her with bathing and dressing.  She ambulates with use of a walker.  Patient says she has been told that she is on last line treatment for her breast cancer.  Despite that, patient says that she still hopes for a  cure.  We talked about what would happen if she has disease progression on her current line of treatment.  Patient says that she would anticipate then involving hospice at home with a focus on comfort.  We discussed what hospice would entail.  Patient says her current goals are aligned with continued treatment for the breast cancer.  She wants to follow-up with her oncologist. She had an appointment scheduled last week, which she missed due to illness.  Patient says that she is in agreement with the current scope of treatment during this hospitalization.  We discussed advance care  planning.  Patient says that she is in the process of completing a healthcare power of attorney document and would want her son and daughter to make decisions on her behalf if necessary.  However, she has not yet had that document notarized but plans to do so at the first available opportunity.  Patient says that she is still thinking about her wishes for CODE STATUS.  She does not think that she would want her life prolonged artificially on machines but is less sure about CPR.  I explained that CPR is often futile in the setting of advanced cancer.  Patient wants to think about it and will also talk to her family.  Symptomatically, patient currently denies pain or other distressing symptoms.  She feels improved since being admitted to the hospital.  PLAN: -Continue current prescription treatment -Would recommend home-based palliative care at time of discharge  Time Total: 60 minutes  Visit  consisted of counseling and education dealing with the complex and emotionally intense issues of symptom management and palliative care in the setting of serious and potentially life-threatening illness.Greater than 50%  of this time was spent counseling and coordinating care related to the above assessment and plan.  Signed by: Altha Harm, PhD, NP-C [phone] (Work Cell)

## 2018-06-01 NOTE — Plan of Care (Signed)
  Problem: Clinical Measurements: Goal: Cardiovascular complication will be avoided Outcome: Progressing   

## 2018-06-01 NOTE — Progress Notes (Addendum)
Pharmacy Consult for Darbepoetin (Aranesp)  Patient follows with Dr Burr Medico for metastatic breast cancer with history of L mastectomy. Last dose of 500 mcg subQ once on 05/16/2018.   Plan per Office Appointment Note on 05/16/2018 -Aranesp injection every 2 weeks >> has been on Aranesp since September 2019 with anemia slowly improving. Plans to continue 500 mg injections every 2 weeks and stop if Hb above 7-8. -B12 monthly -Lovenox 100 mg every 12 hours in early Feb 2020 (adjusted based on her weight) -Monthly fulvestrant injection 500 mg starting on 05/03/18 with loading dose for first month    Will order 500 mcg subQ once dose as per Dr Ernestina Penna last recommendations today.   Antonietta Jewel, PharmD, [place] Clinical Pharmacist  Pager: [phone] Phone: [phone]

## 2018-06-01 NOTE — Progress Notes (Signed)
PROGRESS NOTE    Michaela Little  URK:[id number] DOB: Jan 02, 1961 DOA: 05/30/2018 PCP: Ladell Pier, MD   Brief Narrative: 58 y.o.femalewith medical history significant ofmetastatic breast cancer, pulmonary embolism, DVT, severe anemia, on Lovenox, [employer] with hemoglobin at about 4-5, presented to the emergency room with shortness of breath and chest discomfort. According to the patient, she started this today morning, insidious in onset, felt palpitations and diffuse chest pressure sensation. She did feel somehow dizzy and shortness of breath but did not pass out. She is noted to have a heme baseline hemoglobin of 4. Denies any active blood loss. No cough, no fever no congestion. Chest pain is mostly substernal no radiation and mild intensity. Patient is currently on Lovenox 100 mg twice daily at home. Patient has chronic weakness, fatigue and also has musculoskeletal pain secondary to bony metastasis. ED Course:EMS found her with heart rate of 172 with A. fib.Her blood pressures were fairly stable in the emergency room. Hemoglobin is 5.2 which is historically best. Potassium 3.1. Renal functions are normal. Twelve-lead EKG shows A. fib with RVR. Patient was started on Cardizem infusion in the emergency room.  Assessment & Plan:   Principal Problem:   Atrial fibrillation with RVR (HCC) Active Problems:   Asthma   OSA (obstructive sleep apnea)   Hyperlipidemia   Malignant neoplasm of overlapping sites of left breast in female, estrogen receptor positive (HCC)   GERD (gastroesophageal reflux disease)   Essential hypertension   Bone metastases (HCC)   Refusal of blood transfusions as patient is Jehovah's Witness   Pulmonary embolism (HCC)   New onset a-fib (HCC)  1 A. fib with RVR new onset secondary to severe anemia recent flulike symptoms.  Patient is now off Cardizem drip continue beta-blocker.  She is in and out of A. fib and normal sinus rhythm.   She had  an echo January 2020 with normal ejection fraction She is rate controlled on beta-blocker continue.  #2 hypercalcemia of malignancy-CORRECTED  calcium with albumin is around 13.  Patient has history of chronic hypercalcemia.  I will continue IV fluids and recheck levels tomorrow.  #3 severe normocytic anemia chronic hemoglobin 4.0.  She received IV iron and p.o. iron.  She adamantly refuses blood transfusion due to her believe she is Jehovah witness.  He has no signs of active bleeding.  I have told her that if she does not take the blood she could die of severe anemia or MI.  She remains on Lovenox 100 mg twice a day for a recent history of PE and DVT.   Platelets have dropped on Lovenox and IV hydration.  Monitor.  #4 history of PE and DVT looking through her prior records it looks like she was diagnosed with this somewhere around the end of 2019 and she does have an IVC filter in place.  She is on Lovenox at home which will be continued.  She is on 2 L of oxygen at home which will be continued.  #5 metastatic breast cancer with a history of left mastectomy patient has mets to bone and appendix .she has received chemoradiation and mastectomy.  Looking through Dr. Fritz Pickerel note she has not had any response to therapy.  And she has discussed DNR/DNI status with the patient and patient wanted to think about it.  Will get palliative care consult today.  Patient is appropriate for hospice.  CT chest no CT abnormality of the lungs or intrathoracic organs diffusion or she has metastatic  disease T7 large lytic lesion and hepatic mets noted.       Nutrition Problem: Increased nutrient needs Etiology: cancer and cancer related treatments     Signs/Symptoms: estimated needs    Interventions: Ensure Enlive (each supplement provides 350kcal and 20 grams of protein)  Estimated body mass index is 35.6 kg/m as calculated from the following:   Height as of this encounter: 5\' 7"  (1.702 m).   Weight  as of this encounter: 103.1 kg.  DVT prophylaxis: Lovenox Code Status: Full code Family Communication: Brother by the bedside Disposition Plan: Pending palliative care consult and PT consult  Consultants:   Palliative care  Procedures: None Antimicrobials: None  Subjective: Resting in bed sleeping the first time I saw her sleeping again at 10 in the morning was orthostatic yesterday with physical therapy.  She reports PT is supposed to come back today and walk her.  Complains of constipation.  Objective: Vitals:   05/31/18 1000 05/31/18 1205 05/31/18 2016 06/01/18 0444  BP: 119/60 134/64 (!) 150/54 (!) 128/55  Pulse:  71  81  Resp: 17  17 17   Temp:   98.8 F (37.1 C) 98.7 F (37.1 C)  TempSrc:   Oral Oral  SpO2:  100%    Weight:    103.1 kg  Height:        Intake/Output Summary (Last 24 hours) at 06/01/2018 1007 Last data filed at 06/01/2018 0500 Gross per 24 hour  Intake 1240 ml  Output 1500 ml  Net -260 ml   Filed Weights   05/30/18 1701 05/31/18 0427 06/01/18 0444  Weight: 99.5 kg 101.3 kg 103.1 kg    Examination:  General exam: Appears calm and comfortable  Respiratory system: Clear to auscultation. Respiratory effort normal. Cardiovascular system: S1 & S2 heard, RRR. No JVD, murmurs, rubs, gallops or clicks. No pedal edema. Gastrointestinal system: Abdomen is nondistended, soft and nontender. No organomegaly or masses felt. Normal bowel sounds heard. Central nervous system: Alert and oriented. No focal neurological deficits. Extremities trace edema  skin: No rashes, lesions or ulcers Psychiatry: Judgement and insight appear normal. Mood & affect appropriate.     Data Reviewed: I have personally reviewed following labs and imaging studies  CBC: Recent Labs  Lab 05/30/18 1308 05/31/18 0430 06/01/18 0445  WBC 6.7 4.1 3.4*  NEUTROABS 2.8  --  1.9  HGB 5.2* 4.3* 4.0*  HCT 18.6* 15.2* 14.3*  MCV 100.0 100.7* 98.6  PLT 135* 103* 99*   Basic Metabolic  Panel: Recent Labs  Lab 05/30/18 1308 05/31/18 0430 06/01/18 0445  NA 137 139 138  K 3.1* 4.0 3.7  CL 105 109 108  CO2 21* 23 26  GLUCOSE 146* 121* 100*  BUN 14 14 12   CREATININE 0.97 1.02* 0.93  CALCIUM 12.8* 12.4* 12.7*   GFR: Estimated Creatinine Clearance: 79.4 mL/min (by C-G formula based on SCr of 0.93 mg/dL). Liver Function Tests: Recent Labs  Lab 05/30/18 1308 06/01/18 0445  AST 31 29  ALT 17 14  ALKPHOS 127* 104  BILITOT 1.1 1.3*  PROT 7.0 5.7*  ALBUMIN 3.2* 2.6*   No results for input(s): LIPASE, AMYLASE in the last 168 hours. No results for input(s): AMMONIA in the last 168 hours. Coagulation Profile: No results for input(s): INR, PROTIME in the last 168 hours. Cardiac Enzymes: Recent Labs  Lab 05/30/18 1836 05/30/18 2303 05/31/18 0430  TROPONINI <0.03 <0.03 <0.03   BNP (last 3 results) No results for input(s): PROBNP in the last 8760  hours. HbA1C: No results for input(s): HGBA1C in the last 72 hours. CBG: No results for input(s): GLUCAP in the last 168 hours. Lipid Profile: No results for input(s): CHOL, HDL, LDLCALC, TRIG, CHOLHDL, LDLDIRECT in the last 72 hours. Thyroid Function Tests: No results for input(s): TSH, T4TOTAL, FREET4, T3FREE, THYROIDAB in the last 72 hours. Anemia Panel: No results for input(s): VITAMINB12, FOLATE, FERRITIN, TIBC, IRON, RETICCTPCT in the last 72 hours. Sepsis Labs: No results for input(s): PROCALCITON, LATICACIDVEN in the last 168 hours.  No results found for this or any previous visit (from the past 240 hour(s)).       Radiology Studies: Dg Chest 2 View  Result Date: 05/30/2018 CLINICAL DATA:  Dyspnea, chest pain EXAM: CHEST - 2 VIEW COMPARISON:  05/16/2018 chest radiograph. FINDINGS: Surgical clips overlie the left axilla. Stable cardiomediastinal silhouette with heart size. No pneumothorax. No pleural effusion. No overt pulmonary edema. No acute consolidative airspace disease. Stable widespread sclerotic  osseous metastatic disease. IMPRESSION: No active cardiopulmonary disease. 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 Electronically Signed   By: Ilona Sorrel M.D.   On: 05/30/2018 14:21   Ct Chest W Contrast  Result Date: 05/31/2018 CLINICAL DATA:  Evaluate malignancy, spinal metastatic disease EXAM: CT CHEST WITH CONTRAST TECHNIQUE: Multidetector CT imaging of the chest was performed during intravenous contrast administration. CONTRAST:  66mL OMNIPAQUE IOHEXOL 300 MG/ML  SOLN COMPARISON:  CT abdomen pelvis, 05/03/2018, CT chest, 04/02/2018 FINDINGS: Cardiovascular: No significant vascular findings. Normal heart size. No pericardial effusion. Mediastinum/Nodes: No enlarged mediastinal, hilar, or axillary lymph nodes. Thyroid gland, trachea, and esophagus demonstrate no significant findings. Lungs/Pleura: Lungs are clear. No pleural effusion or pneumothorax. Upper Abdomen: Numerous low-attenuation ill-defined lesions of the liver. Musculoskeletal: Status post left mastectomy. There is diffuse, mixed lytic and sclerotic osseous metastatic disease involving all included osseous structures, particularly conspicuous at T7 (series 7, image 86). IMPRESSION: 1.  No CT abnormality of the lungs or intrathoracic organs. 2. Diffuse osseous metastatic disease, particularly conspicuous at T7 with a large lytic lesion, concerning for pathologic fracture risk. 3. Hepatic metastatic disease in the partially included upper abdomen. 4.  Status post left mastectomy. Electronically Signed   By: Eddie Candle M.D.   On: 05/31/2018 13:12        Scheduled Meds: . bisacodyl  10 mg Oral Daily  . carvedilol  12.5 mg Oral BID WC  . enoxaparin  100 mg Subcutaneous Q12H  . feeding supplement (ENSURE ENLIVE)  237 mL Oral TID BM  . ferrous sulfate  325 mg Oral TID WC  . fluticasone  1 spray Each Nare Daily  . fluticasone furoate-vilanterol  1 puff Inhalation Daily  . folic acid  1 mg Oral Daily  .  hydrALAZINE  10 mg Oral TID  . multivitamin with minerals  1 tablet Oral Q M,W,F  . polyethylene glycol  17 g Oral Daily   Continuous Infusions: . sodium chloride 125 mL/hr at 06/01/18 0820  . diltiazem (CARDIZEM) infusion Stopped (05/31/18 0305)     LOS: 2 days     Georgette Shell, MD [facility]  If 7PM-7AM, please contact night-coverage [email or web address] Password [email or web address] 06/01/2018, 10:07 AM

## 2018-06-02 DIAGNOSIS — C7951 Secondary malignant neoplasm of bone: Secondary | ICD-10-CM

## 2018-06-02 DIAGNOSIS — I82402 Acute embolism and thrombosis of unspecified deep veins of left lower extremity: Secondary | ICD-10-CM

## 2018-06-02 DIAGNOSIS — D649 Anemia, unspecified: Secondary | ICD-10-CM

## 2018-06-02 DIAGNOSIS — I4891 Unspecified atrial fibrillation: Secondary | ICD-10-CM

## 2018-06-02 DIAGNOSIS — Z170 Estrogen receptor positive status [ER+]: Secondary | ICD-10-CM

## 2018-06-02 DIAGNOSIS — C50812 Malignant neoplasm of overlapping sites of left female breast: Secondary | ICD-10-CM

## 2018-06-02 LAB — CBC WITH DIFFERENTIAL/PLATELET
Abs Immature Granulocytes: 0.11 10*3/uL — ABNORMAL HIGH (ref 0.00–0.07)
BASOS PCT: 0 %
Basophils Absolute: 0 10*3/uL (ref 0.0–0.1)
Eosinophils Absolute: 0.1 10*3/uL (ref 0.0–0.5)
Eosinophils Relative: 3 %
HCT: 14.4 % — ABNORMAL LOW (ref 36.0–46.0)
Hemoglobin: 4 g/dL — CL (ref 12.0–15.0)
Immature Granulocytes: 3 %
Lymphocytes Relative: 27 %
Lymphs Abs: 0.9 10*3/uL (ref 0.7–4.0)
MCH: 27.4 pg (ref 26.0–34.0)
MCHC: 27.8 g/dL — ABNORMAL LOW (ref 30.0–36.0)
MCV: 98.6 fL (ref 80.0–100.0)
Monocytes Absolute: 0.4 10*3/uL (ref 0.1–1.0)
Monocytes Relative: 13 %
Neutro Abs: 1.9 10*3/uL (ref 1.7–7.7)
Neutrophils Relative %: 54 %
PLATELETS: 116 10*3/uL — AB (ref 150–400)
RBC: 1.46 MIL/uL — ABNORMAL LOW (ref 3.87–5.11)
RDW: 20.7 % — ABNORMAL HIGH (ref 11.5–15.5)
WBC: 3.5 10*3/uL — ABNORMAL LOW (ref 4.0–10.5)
nRBC: 7.2 % — ABNORMAL HIGH (ref 0.0–0.2)

## 2018-06-02 LAB — BASIC METABOLIC PANEL
Anion gap: 10 (ref 5–15)
BUN: 15 mg/dL (ref 6–20)
CALCIUM: 13 mg/dL — AB (ref 8.9–10.3)
CO2: 25 mmol/L (ref 22–32)
Chloride: 103 mmol/L (ref 98–111)
Creatinine, Ser: 1.07 mg/dL — ABNORMAL HIGH (ref 0.44–1.00)
GFR calc Af Amer: >60 mL/min (ref >60)
GFR calc non Af Amer: 56 mL/min — ABNORMAL LOW (ref >60)
Glucose, Bld: 96 mg/dL (ref 70–99)
Potassium: 3.5 mmol/L (ref 3.5–5.1)
SODIUM: 138 mmol/L (ref 135–145)

## 2018-06-02 MED ORDER — ZOLEDRONIC ACID 4 MG/5ML IV CONC
4.0000 mg | Freq: Once | INTRAVENOUS | Status: AC
  Administered 2018-06-03: 4 mg via INTRAVENOUS
  Filled 2018-06-02: qty 5

## 2018-06-02 NOTE — Evaluation (Signed)
Occupational Therapy Evaluation Patient Details Name: Michaela Little MRN: [id number] DOB: 1960/11/18 Today's Date: 06/02/2018    History of Present Illness Pt is a 58 y.o. female admitted 05/30/18 with SOB and chest discomfort. Worked up for afib with RVR. PMH includes anemia (baseline Hgb of 4), asthma, OSA, breast CA with bone metastases.   Clinical Impression   Pt admitted with above problem list and now presenting with generalized weakness, deficits in standing tolerance/balance, and BUE weakness. Min guard overall required during ADL transfers and bathing/dressing, however pt limited by lethargy. Pt would benefit from SNF d/t current fatigue levels making her a high burden of care for family. OT will continue to follow acutely.     Follow Up Recommendations  SNF;Supervision/Assistance - 24 hour    Equipment Recommendations  Other (comment)(defer to next venue)    Recommendations for Other Services PT consult;Speech consult     Precautions / Restrictions Precautions Precautions: Fall;Other (comment) Precaution Comments: Low hgb (per chart, baseline 4), no blood products Restrictions Weight Bearing Restrictions: No      Mobility Bed Mobility Overal bed mobility: Needs Assistance Bed Mobility: Supine to Sit     Supine to sit: Min guard     General bed mobility comments: Min guard to bring legs into bed  Transfers Overall transfer level: Needs assistance Equipment used: Rolling walker (2 wheeled) Transfers: Sit to/from [facility] Sit to Stand: Min guard Stand pivot transfers: Min guard            Balance Overall balance assessment: Needs assistance Sitting-balance support: No upper extremity supported;Feet supported Sitting balance-Leahy Scale: Good     Standing balance support: Bilateral upper extremity supported;During functional activity Standing balance-Leahy Scale: Poor Standing balance comment: relies on RW for support                            ADL either performed or assessed with clinical judgement   ADL Overall ADL's : Needs assistance/impaired     Grooming: Wash/dry face;Sitting;Set up   Upper Body Bathing: Min guard;Sitting   Lower Body Bathing: Min guard;Sit to/from stand   Upper Body Dressing : Supervision/safety;Sitting   Lower Body Dressing: Min guard;Sit to/from stand Lower Body Dressing Details (indicate cue type and reason): Use of RW to stedy in standing Toilet Transfer: Min guard;RW;BSC   Toileting- Water quality scientist and Hygiene: Min guard;Sit to/from stand       Functional mobility during ADLs: Rolling walker;Min guard General ADL Comments: min guard overall in toileting tasks     Vision Baseline Vision/History: No visual deficits Patient Visual Report: No change from baseline Vision Assessment?: No apparent visual deficits            Pertinent Vitals/Pain Pain Assessment: 0-10 Pain Score: 4  Faces Pain Scale: Hurts a little bit Pain Location: Chest Pain Descriptors / Indicators: Discomfort Pain Intervention(s): Limited activity within patient's tolerance     Hand Dominance Right   Extremity/Trunk Assessment Upper Extremity Assessment Upper Extremity Assessment: Generalized weakness   Lower Extremity Assessment Lower Extremity Assessment: Overall WFL for tasks assessed   Cervical / Trunk Assessment Cervical / Trunk Assessment: Normal   Communication Communication Communication: No difficulties   Cognition Arousal/Alertness: Lethargic Behavior During Therapy: Flat affect Overall Cognitive Status: Within Functional Limits for tasks assessed  General Comments: Pt very lethargic and fatigued, keeping eyes closed most of session. BP 145/75   General Comments  No family present, visitor entering at end of session            Home Living Family/patient expects to be discharged to:: Private residence Living  Arrangements: Alone Available Help at Discharge: Family;Personal care attendant;Available PRN/intermittently Type of Home: House Home Access: Level entry     Home Layout: One level     Bathroom Shower/Tub: Teacher, early years/pre: Standard     Home Equipment: Bedside commode;Shower seat;Walker - 2 wheels;Hospital bed;Wheelchair - manual          Prior Functioning/Environment Level of Independence: Needs assistance  Gait / Transfers Assistance Needed: Mod indep with RW; wheelchair if fatigued. Wears 2L O2 [place] at home. Has been working with [employer] since previous admission at [facility] Assistance Needed: PCA comes M-F to assist with household tasks and bathing as needed   Comments: reports 2L O2 [place] at home        OT Problem List: Pain;Decreased strength;Impaired balance (sitting and/or standing);Decreased range of motion;Decreased activity tolerance;Decreased coordination;Decreased cognition;Decreased safety awareness;Decreased knowledge of use of DME or AE;Decreased knowledge of precautions;Impaired sensation;Cardiopulmonary status limiting activity      OT Treatment/Interventions: Self-care/ADL training;Therapeutic exercise;Energy conservation;DME and/or AE instruction;Therapeutic activities;Patient/family education;Balance training    OT Goals(Current goals can be found in the care plan section) Acute Rehab OT Goals Patient Stated Goal: none stated OT Goal Formulation: With patient Time For Goal Achievement: 06/12/18 Potential to Achieve Goals: Fair ADL Goals Pt Will Perform Upper Body Bathing: with supervision Pt Will Perform Lower Body Bathing: with supervision Pt Will Perform Upper Body Dressing: with supervision Pt Will Perform Lower Body Dressing: with supervision Pt Will Transfer to Toilet: with supervision Pt Will Perform Toileting - Clothing Manipulation and hygiene: with supervision  OT Frequency: Min 2X/week    AM-PAC OT "6 Clicks" Daily  Activity     Outcome Measure Help from another person eating meals?: None Help from another person taking care of personal grooming?: None Help from another person toileting, which includes using toliet, bedpan, or urinal?: A Little Help from another person bathing (including washing, rinsing, drying)?: A Little Help from another person to put on and taking off regular upper body clothing?: A Little Help from another person to put on and taking off regular lower body clothing?: A Little 6 Click Score: 20   End of Session Equipment Utilized During Treatment: Rolling walker Nurse Communication: Mobility status  Activity Tolerance: Patient limited by lethargy Patient left: in bed;with call bell/phone within reach;with family/visitor present  OT Visit Diagnosis: Unsteadiness on feet (R26.81);Muscle weakness (generalized) (M62.81)                Time: 8469-6295 OT Time Calculation (min): 11 min Charges:  OT General Charges $OT Visit: 1 Visit OT Evaluation $OT Eval Low Complexity: [place] OTR/L  06/02/2018, 2:40 PM

## 2018-06-02 NOTE — Progress Notes (Signed)
PROGRESS NOTE    Michaela Little  FYB:[id number] DOB: [birth year] DOA: 05/30/2018 PCP: Ladell Pier, MD   Brief Narrative:   58 y.o.femalewith medical history significant ofmetastatic breast cancer, pulmonary embolism, DVT, severe anemia, on Lovenox, Jehovah's Witness with hemoglobin at about 4-5, presented to the emergency room with shortness of breath and chest discomfort. According to the patient, she started this today morning, insidious in onset, felt palpitations and diffuse chest pressure sensation. She did feel somehow dizzy and shortness of breath but did not pass out. She is noted to have a heme baseline hemoglobin of 4. Denies any active blood loss. No cough, no fever no congestion. Chest pain is mostly substernal no radiation and mild intensity. Patient is currently on Lovenox 100 mg twice daily at home. Patient has chronic weakness, fatigue and also has musculoskeletal pain secondary to bony metastasis. ED Course:EMS found her with heart rate of 172 with A. fib.Her blood pressures were fairly stable in the emergency room. Hemoglobin is 5.2 which is historically best. Potassium 3.1. Renal functions are normal. Twelve-lead EKG shows A. fib with RVR. Patient was started on Cardizem infusion in the emergency room.  Assessment & Plan:   Principal Problem:   Atrial fibrillation with RVR (HCC) Active Problems:   Asthma   OSA (obstructive sleep apnea)   Hyperlipidemia   Malignant neoplasm of overlapping sites of left breast in female, estrogen receptor positive (HCC)   GERD (gastroesophageal reflux disease)   Essential hypertension   Bone metastases (HCC)   Refusal of blood transfusions as patient is Jehovah's Witness   Pulmonary embolism ([place])   New onset a-fib ([place])   Palliative care encounter  1 A. fib with RVR new onset secondary to severe anemia recent flulike symptoms.  Patient is now off Cardizem drip continue beta-blocker.  She is in and out of A. fib  and normal sinus rhythm.   She had an echo January 2020 with normal ejection fraction She is rate controlled on coreg 12.5 bid  #2 hypercalcemia of malignancy-CORRECTED calcium with albumin is around 13.  Patient has history of chronic hypercalcemia.  Monitor periodically  #3 severe normocytic anemia chronic hemoglobin 4.0.  She received IV iron and p.o. iron.  She adamantly refuses blood transfusion due to her believe she is Jehovah witness.  She does however seem to be reconsidering this when I speak to her  He has no signs of active bleeding.  I have told her that if she does not take the blood she could die of severe anemia or MI.  She remains on Lovenox 100 mg twice a day for a recent history of PE and DVT.   Platelets have dropped on Lovenox and IV hydration.  Monitor.  #4 history of PE and DVT looking through her prior records it looks like she was diagnosed with this somewhere around the end of 2019 and she does have an IVC filter in place.  She is on Lovenox at home which will be continued.  She is on 2 L of oxygen at home which will be continued.  #5 metastatic breast cancer with a history of left mastectomy patient has mets to bone and appendix .she has received chemoradiation and mastectomy.  Looking through Dr. Fritz Pickerel note she has not had any response to therapy.  And she has discussed DNR/DNI status with the patient and patient wanted to think about it.   Appreciate palliative input   Patient is appropriate for hospice.  CT chest no  CT abnormality of the lungs or intrathoracic organs diffusion or she has metastatic disease T7 large lytic lesion and hepatic mets noted.  Estimated body mass index is 34.6 kg/m as calculated from the following:   Height as of this encounter: 5\' 7"  (1.702 m).   Weight as of this encounter: 100.2 kg.  DVT prophylaxis: Lovenox Code Status: Full code Family Communication: Brother by the bedside Disposition Plan: await therapy services--liekly need  HH in the next several days  Consultants:   Palliative care  Procedures: None Antimicrobials: None  Subjective:  Feels dizzy and unwell getting up today Seems to be reconsidering blood?!  Objective: Vitals:   06/02/18 1211 06/02/18 1212 06/02/18 1217 06/02/18 1230  BP: 112/70 120/66 (!) 128/103 (!) 122/44  Pulse: 68   66  Resp: 18   12  Temp: 98 F (36.7 C)     TempSrc: Oral     SpO2: 97%   99%  Weight:      Height:        Intake/Output Summary (Last 24 hours) at 06/02/2018 1410 Last data filed at 06/02/2018 1100 Gross per 24 hour  Intake 2958.77 ml  Output 2100 ml  Net 858.77 ml   Filed Weights   05/31/18 0427 06/01/18 0444 06/02/18 0436  Weight: 101.3 kg 103.1 kg 100.2 kg    Examination:  Awake flat affect Ct ab no added sound s1 s 2no m-tele nsr  abd soft nt nd no rebound no guard Neuro intact    Data Reviewed: I have personally reviewed following labs and imaging studies  CBC: Recent Labs  Lab 05/30/18 1308 05/31/18 0430 06/01/18 0445 06/02/18 0401  WBC 6.7 4.1 3.4* 3.5*  NEUTROABS 2.8  --  1.9 1.9  HGB 5.2* 4.3* 4.0* 4.0*  HCT 18.6* 15.2* 14.3* 14.4*  MCV 100.0 100.7* 98.6 98.6  PLT 135* 103* 99* 174*   Basic Metabolic Panel: Recent Labs  Lab 05/30/18 1308 05/31/18 0430 06/01/18 0445 06/02/18 0401  NA 137 139 138 138  K 3.1* 4.0 3.7 3.5  CL 105 109 108 103  CO2 21* 23 26 25   GLUCOSE 146* 121* 100* 96  BUN 14 14 12 15   CREATININE 0.97 1.02* 0.93 1.07*  CALCIUM 12.8* 12.4* 12.7* 13.0*   GFR: Estimated Creatinine Clearance: 68 mL/min (A) (by C-G formula based on SCr of 1.07 mg/dL (H)). Liver Function Tests: Recent Labs  Lab 05/30/18 1308 06/01/18 0445  AST 31 29  ALT 17 14  ALKPHOS 127* 104  BILITOT 1.1 1.3*  PROT 7.0 5.7*  ALBUMIN 3.2* 2.6*   No results for input(s): LIPASE, AMYLASE in the last 168 hours. No results for input(s): AMMONIA in the last 168 hours. Coagulation Profile: No results for input(s): INR, PROTIME  in the last 168 hours. Cardiac Enzymes: Recent Labs  Lab 05/30/18 1836 05/30/18 2303 05/31/18 0430  TROPONINI <0.03 <0.03 <0.03   BNP (last 3 results) No results for input(s): PROBNP in the last 8760 hours. HbA1C: No results for input(s): HGBA1C in the last 72 hours. CBG: No results for input(s): GLUCAP in the last 168 hours. Lipid Profile: No results for input(s): CHOL, HDL, LDLCALC, TRIG, CHOLHDL, LDLDIRECT in the last 72 hours. Thyroid Function Tests: No results for input(s): TSH, T4TOTAL, FREET4, T3FREE, THYROIDAB in the last 72 hours. Anemia Panel: No results for input(s): VITAMINB12, FOLATE, FERRITIN, TIBC, IRON, RETICCTPCT in the last 72 hours. Sepsis Labs: No results for input(s): PROCALCITON, LATICACIDVEN in the last 168 hours.  Recent Results (  from the past 240 hour(s))  Culture, Urine     Status: None   Collection Time: 05/31/18  8:18 AM  Result Value Ref Range Status   Specimen Description URINE, RANDOM  Final   Special Requests NONE  Final   Culture   Final    NO GROWTH Performed at [facility], [address] [place], [place] [address]    Report Status 06/01/2018 FINAL  Final    Radiology Studies: No results found.   Scheduled Meds: . bisacodyl  10 mg Oral Daily  . carvedilol  12.5 mg Oral BID WC  . enoxaparin (LOVENOX) injection  100 mg Subcutaneous Q12H  . feeding supplement (ENSURE ENLIVE)  237 mL Oral TID BM  . ferrous sulfate  325 mg Oral TID WC  . fluticasone  1 spray Each Nare Daily  . fluticasone furoate-vilanterol  1 puff Inhalation Daily  . folic acid  1 mg Oral Daily  . hydrALAZINE  10 mg Oral TID  . multivitamin with minerals  1 tablet Oral Q M,W,F  . polyethylene glycol  17 g Oral Daily   Continuous Infusions: . sodium chloride 75 mL/hr at 06/02/18 1042  . diltiazem (CARDIZEM) infusion Stopped (05/31/18 0305)     LOS: 3 days     Nita Sells, MD [facility]  If 7PM-7AM, please contact  night-coverage [email or web address] Password [email or web address] 06/02/2018, 2:10 PM

## 2018-06-02 NOTE — Progress Notes (Addendum)
[facility] INPATIENT PROGRESS NOTE  Oncology History   Cancer Staging Malignant neoplasm of overlapping sites of left breast in female, estrogen receptor positive ([place]) Staging form: Breast, AJCC 7th Edition - Clinical: Stage IIIA (T2, N2, M0) - Unsigned       Malignant neoplasm of overlapping sites of left breast in female, estrogen receptor positive ([place])   05/2008 Cancer Diagnosis    Left sided breast cancer (no path report available)    11/2008 Pathologic Stage    Stage IIIA: T2 N2     Neo-Adjuvant Chemotherapy    Paclitaxel weekly x 12; doxorubicin and cyclophosphamide x 4 - both given with trifiparnib (treated at [facility] in [place])    11/2008 Definitive Surgery    Left modified radiation mastectomy: invasive lobular carcinoma, ER+, PR+, HER2/neu negative, 5/22 LN positive     - 03/2009 Radiation Therapy    Adjuvant radiation to left chest wall    2011 - 08/2014 Anti-estrogen oral therapy    Tamoxifen 20 mg used briefly; discontinued due to uterine lining concerns; changed to anastrozole 1 mg daily (began 07/2100); discontinued by patient summer of 2016    05/26/2015 Progression    Iliac bone biopsy showed metastatic carcinoma consistent with a breast primary, ER positive, PR and HER-2 negative. Her staging CT, and a PET scan was negative for visceral metastasis.    05/2015 - 01/2018 Anti-estrogen oral therapy    1. letrozole and Ibrance started March 2017             -Ibrance dose decreased to 75 mg daily. Stopped in 11/2017 due to anemia   -Letrozole switched to Exemestane on 02/13/17 due to b/l rib pain. She exemestane herself in 01/2018.      11/16/2015 Miscellaneous    Xgeva monthly for bone metastasis     07/03/2016 Imaging    CT chest, abdomen and pelvis with contrast showed no evidence of metastasis.     02/07/2017 Imaging    CT AP W Contrast 02/07/17 IMPRESSION: 1. No CT findings for abdominal/pelvic metastatic disease. 2. No acute abdominal  findings, mass lesions or adenopathy. 3. Status post cholecystectomy with mild associated common bile duct dilatation. 4. Somewhat thickened appendix appears relatively stable. No acute inflammatory process. 5. Stable mixed lytic and sclerotic osseous metastatic disease.     02/07/2017 Imaging    Bone Scan Whole Body 02/07/17 IMPRESSION: No scintigraphic evidence skeletal metastasis     08/12/2017 Imaging    Whole Boday Scan 08/12/17 IMPRESSION: Scattered degenerative type uptake as above with a questionable focus of increased tracer localization at L2 vertebral body versus artifact; no abnormality is seen at this site by CT. Consider either characterization by MR or attention on follow-up imaging.    08/12/2017 Imaging    CT AP W Contrast 08/12/17  IMPRESSION: Continued thickening of the appendix is noted without surrounding inflammation. It has maximum measured diameter is decreased compared to prior exam. There is no evidence of acute inflammation. Stable mixed lytic and sclerotic appearance of visualized skeleton is noted consistent with history of known osseous metastases. No acute abnormality seen in the abdomen or pelvis.    08/22/2017 Surgery    APPENDECTOMY LAPAROSCOPIC ERAS PATHWAY by Dr. Malcolm Metro and REMOVAL OF LEFT TISSUE EXPANDER by Dr. Harlow Mares 08/22/17    08/22/2017 Pathology Results    Diagnosis 08/22/17  1. Appendix, Other than Incidental - METASTATIC CARCINOMA, CONSISTENT WITH BREAST PRIMARY. - CARCINOMA IS PRESENT AT THE SURGICAL RESECTION MARGIN AS WELL  AS THE SEROSAL SURFACE. - LYMPHOVASCULAR INVASION IS IDENTIFIED. - SEE COMMENT. 2. Breast, capsule, Left - DENSE FIBROUS TISSUE WITH MILD INFLAMMATION, INCLUDING A FEW SCATTERED MULTINUCLEATED GIANT CELLS. - THERE IS NO EVIDENCE OF MALIGNANCY.    08/27/2017 Imaging    CT AP W Contrast 08/27/17 IMPRESSION: No evidence of pulmonary embolus. Small pericardial effusion with uncertain clinical significance. No  evidence of acute abnormalities within the chest, abdomen or pelvis. Area of fat stranding and small focus of gas within the anterior abdominal wall, immediately inferior to the umbilicus, probably at the site of laparoscopic access.    10/07/2017 Echocardiogram    10/07/2017 ECO Study Conclusions  - Procedure narrative: Transthoracic echocardiography. Image   quality was adequate. The study was technically difficult, as a   result of poor acoustic windows. - Left ventricle: The cavity size was normal. Wall thickness was   normal. Systolic function was normal. The estimated ejection   fraction was in the range of 60% to 65%. Wall motion was normal;   there were no regional wall motion abnormalities. Features are   consistent with a pseudonormal left ventricular filling pattern,   with concomitant abnormal relaxation and increased filling   pressure (grade 2 diastolic dysfunction).    01/16/2018 PET scan    01/16/2018 PET Scan IMPRESSION: 1. Most striking finding is intense marrow activity diffusely throughout the axillary and appendicular skeleton. This is favored a physiologic marrow response from chronic anemia rather than diffuse metastatic disease. 2. Several discrete foci of more intense activity noted in the spine which could indicate malignancy or trauma. Difficult to define malignancy on the background of diffuse marrow activity. 3. Diffuse sclerosis throughout the bones similar to a recent CT scans but new from remote PET-CT scan 03/01/2016. 4. No evidence soft tissue metastasis.     04/02/2018 Imaging    CT Angio CAP 04/02/18  IMPRESSION: 1. Multiple new hepatic lesions highly concerning for developing metastatic disease to the liver. This could be confirmed with nonemergent MRI of the abdomen with and without IV gadolinium if clinically appropriate. 2. Widespread metastatic disease to the bones also appears progressive compared to prior examinations. 3. Large  right-sided pelvic deep venous thrombosis extending from the right common femoral vein to the proximal right external iliac vein. IVC filter is in position. No clot identified in the IVC filter at this time. No pulmonary embolism on today's examination. 4. Aortic atherosclerosis. 5. Additional incidental findings, as above.    05/01/2018 -  Anti-estrogen oral therapy    Fulvestrant injections 568m monthly starting 05/01/18    SUBJECTIVE: Michaela Little 58y.o. female with metastatic breast cancer.  She is currently receiving fulvestrant and Aranesp in our office.  She presented to the emergency room with shortness of breath and chest discomfort. According to the patient, she started this today morning, insidious in onset, felt palpitations and diffuse chest pressure sensation. She did feel somehow dizzy and shortness of breath but did not pass out. She is noted to have a baseline hemoglobin of 4.  Patient has chronic weakness, fatigue and also has musculoskeletal pain secondary to bony metastasis. EMS found her with heart rate of 172 with A. fib.Her blood pressures were fairly stable in the emergency room. Hemoglobin on admission was 5.2 which is historically best. Potassium 3.1. Renal function normal. Twelve-lead EKG shows A. fib with RVR. Patient was started on Cardizem infusion in the emergency room.  When seen today, the patient is resting quietly in her room.  Her husband is at the bedside.  States that she is tired and just wants to sleep.  She has no specific complaints but asks about a "mass" in her chest.  Has her baseline pain.  Denies chest pain, shortness of breath, cough.  Denies bleeding.  ALLERGIES:  is allergic to lisinopril-hydrochlorothiazide; adhesive [tape]; fentanyl; gabapentin; hctz [hydrochlorothiazide]; latex; lisinopril; losartan potassium; other; oxycodone; and tums [calcium carbonate antacid].  MEDICATIONS:  Current Facility-Administered Medications  Medication  Dose Route Frequency Provider Last Rate Last Dose  . 0.9 %  sodium chloride infusion   Intravenous Continuous Georgette Shell, MD 75 mL/hr at 06/02/18 1042    . acetaminophen (TYLENOL) tablet 500 mg  500 mg Oral Q6H PRN Barb Merino, MD      . albuterol (PROVENTIL) (2.5 MG/3ML) 0.083% nebulizer solution 2.5 mg  2.5 mg Nebulization Q2H PRN Barb Merino, MD      . bisacodyl (DULCOLAX) EC tablet 10 mg  10 mg Oral Daily Georgette Shell, MD   10 mg at 06/02/18 3154  . carvedilol (COREG) tablet 12.5 mg  12.5 mg Oral BID WC Barb Merino, MD   12.5 mg at 06/02/18 0829  . diltiazem (CARDIZEM) 100 mg in dextrose 5% 16m (1 mg/mL) infusion  5-15 mg/hr Intravenous Continuous GBarb Merino MD   Stopped at 05/31/18 0305  . enoxaparin (LOVENOX) injection 100 mg  100 mg Subcutaneous Q12H MGeorgette Shell MD   100 mg at 06/02/18 0830  . feeding supplement (ENSURE ENLIVE) (ENSURE ENLIVE) liquid 237 mL  237 mL Oral TID BM GBarb Merino MD   237 mL at 06/02/18 1236  . ferrous sulfate tablet 325 mg  325 mg Oral TID WC MGeorgette Shell MD   325 mg at 06/02/18 1236  . fluticasone (FLONASE) 50 MCG/ACT nasal spray 1 spray  1 spray Each Nare Daily GBarb Merino MD   1 spray at 06/02/18 0831  . fluticasone furoate-vilanterol (BREO ELLIPTA) 100-25 MCG/INH 1 puff  1 puff Inhalation Daily GBarb Merino MD   1 puff at 06/01/18 2031  . folic acid (FOLVITE) tablet 1 mg  1 mg Oral Daily GBarb Merino MD   1 mg at 06/02/18 00086 . hydrALAZINE (APRESOLINE) tablet 10 mg  10 mg Oral TID GBarb Merino MD   10 mg at 06/02/18 0829  . ipratropium-albuterol (DUONEB) 0.5-2.5 (3) MG/3ML nebulizer solution 3 mL  3 mL Nebulization Q6H PRN GBarb Merino MD      . multivitamin with minerals tablet 1 tablet  1 tablet Oral Q M,W,F GBarb Merino MD   1 tablet at 06/02/18 0830  . polyethylene glycol (MIRALAX / GLYCOLAX) packet 17 g  17 g Oral Daily MGeorgette Shell MD   17 g at 06/02/18 0830  . traMADol  (ULTRAM) tablet 50 mg  50 mg Oral Q6H PRN GBarb Merino MD   50 mg at 06/02/18 1236    REVIEW OF SYSTEMS:   Review of Systems  Constitutional: Positive for malaise/fatigue. Negative for chills and fever.  HENT: Negative.   Eyes: Negative.   Respiratory: Negative.   Cardiovascular: Negative.   Gastrointestinal: Negative.   Genitourinary: Negative.   Musculoskeletal: Positive for back pain.  Skin: Negative.   Neurological: Negative.   Endo/Heme/Allergies: Negative.   Psychiatric/Behavioral: Negative.       PHYSICAL EXAMINATION:  Vital signs in last 24 hours: Temp:  [98 F (36.7 C)-98.7 F (37.1 C)] 98 F (36.7 C) (03/09 1211) Pulse Rate:  [66-74] 66 (03/09 1230)  Resp:  [12-18] 12 (03/09 1230) BP: (112-130)/(44-103) 122/44 (03/09 1230) SpO2:  [97 %-100 %] 99 % (03/09 1230) Weight:  [220 lb 14.4 oz (100.2 kg)] 220 lb 14.4 oz (100.2 kg) (03/09 0436) Weight change: -6 lb 6.4 oz (-2.903 kg) Last BM Date: 05/29/18  ECOG PERFORMANCE STATUS: 2 - Symptomatic, <50% confined to bed  Intake/Output from previous day: 03/08 0701 - 03/09 0700 In: 2490.8 [P.O.:200; I.V.:2290.8] Out: 1770 [Urine:1770] General: Alert, awake without distress. Head: Normocephalic atraumatic. Mouth: mucous membranes moist, pharynx normal without lesions Eyes: No scleral icterus.  Pupils are equal and round reactive to light. Resp: clear to auscultation bilaterally without rhonchi or wheezes or dullness to percussion. Cardio: regular rate and rhythm, S1, S2 normal, no murmur, click, rub or gallop GI: soft, non-tender; bowel sounds normal; no masses,  no organomegaly Musculoskeletal: No joint deformity or effusion. Neurological: No motor, sensory deficits.   Skin: No rashes or lesions.   LABORATORY DATA: Lab Results  Component Value Date   WBC 3.5 (L) 06/02/2018   HGB 4.0 (LL) 06/02/2018   HCT 14.4 (L) 06/02/2018   MCV 98.6 06/02/2018   PLT 116 (L) 06/02/2018   CMP Latest Ref Rng & Units 06/02/2018  06/01/2018 05/31/2018  Glucose 70 - 99 mg/dL 96 100(H) 121(H)  BUN 6 - 20 mg/dL _0 Creatinine 0.44 - 1.00 mg/dL 1.07(H) 0.93 1.02(H)  Sodium 135 - 145 mmol/L 138 138 139  Potassium 3.5 - 5.1 mmol/L 3.5 3.7 4.0  Chloride 98 - 111 mmol/L 103 108 109  CO2 22 - 32 mmol/L _1 Calcium 8.9 - 10.3 mg/dL 13.0(H) 12.7(H) 12.4(H)  Total Protein 6.5 - 8.1 g/dL - 5.7(L) -  Total Bilirubin 0.3 - 1.2 mg/dL - 1.3(H) -  Alkaline Phos 38 - 126 U/L - 104 -  AST 15 - 41 U/L - 29 -  ALT 0 - 44 U/L - 14 -     RADIOGRAPHIC STUDIES:  Dg Chest 2 View  Result Date: 05/30/2018 CLINICAL DATA:  Dyspnea, chest pain EXAM: CHEST - 2 VIEW COMPARISON:  05/16/2018 chest radiograph. FINDINGS: Surgical clips overlie the left axilla. Stable cardiomediastinal silhouette with heart size. No pneumothorax. No pleural effusion. No overt pulmonary edema. No acute consolidative airspace disease. Stable widespread sclerotic osseous metastatic disease. IMPRESSION: No active cardiopulmonary disease. 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 Electronically Signed   By: Ilona Sorrel M.D.   On: 05/30/2018 14:21   Dg Chest 2 View  Result Date: 05/16/2018 CLINICAL DATA:  History of breast cancer, pancytopenia EXAM: CHEST - 2 VIEW COMPARISON:  05/06/2018, 05/04/2018, 05/02/2018 FINDINGS: Persistent, unchanged in interstitial edema or inflammatory process. No focal consolidation or pleural effusion. Stable cardiomediastinal silhouette. No pneumothorax. Surgical clips in the left axilla. Heterogenous lucencies at both shoulders, unchanged. IMPRESSION: 1. No significant interval change in mild interstitial edema or inflammatory process. No focal pneumonia. 2. Skeletal metastatic disease Electronically Signed   By: Donavan Foil M.D.   On: 05/16/2018 23:18   Dg Chest 2 View  Result Date: 05/06/2018 CLINICAL DATA:  Acute onset of dizziness, generalized weakness and shortness of breath. EXAM: CHEST - 2 VIEW  COMPARISON:  05/04/2018 and earlier. FINDINGS: AP SEMI-ERECT and LATERAL images were obtained. Cardiac silhouette mildly enlarged, unchanged. Hilar and mediastinal contours otherwise unremarkable. Interstitial opacities throughout both lungs are unchanged since the examination 2 days ago. No new pulmonary parenchymal abnormalities. No visible pleural effusions. Degenerative changes involving the thoracic spine. IMPRESSION: Stable  interstitial pulmonary edema versus interstitial pneumonia throughout both lungs when compared to the examination 2 days ago. No new abnormalities. Electronically Signed   By: Evangeline Dakin M.D.   On: 05/06/2018 12:29   Ct Chest W Contrast  Result Date: 05/31/2018 CLINICAL DATA:  Evaluate malignancy, spinal metastatic disease EXAM: CT CHEST WITH CONTRAST TECHNIQUE: Multidetector CT imaging of the chest was performed during intravenous contrast administration. CONTRAST:  32m OMNIPAQUE IOHEXOL 300 MG/ML  SOLN COMPARISON:  CT abdomen pelvis, 05/03/2018, CT chest, 04/02/2018 FINDINGS: Cardiovascular: No significant vascular findings. Normal heart size. No pericardial effusion. Mediastinum/Nodes: No enlarged mediastinal, hilar, or axillary lymph nodes. Thyroid gland, trachea, and esophagus demonstrate no significant findings. Lungs/Pleura: Lungs are clear. No pleural effusion or pneumothorax. Upper Abdomen: Numerous low-attenuation ill-defined lesions of the liver. Musculoskeletal: Status post left mastectomy. There is diffuse, mixed lytic and sclerotic osseous metastatic disease involving all included osseous structures, particularly conspicuous at T7 (series 7, image 86). IMPRESSION: 1.  No CT abnormality of the lungs or intrathoracic organs. 2. Diffuse osseous metastatic disease, particularly conspicuous at T7 with a large lytic lesion, concerning for pathologic fracture risk. 3. Hepatic metastatic disease in the partially included upper abdomen. 4.  Status post left mastectomy.  Electronically Signed   By: AEddie CandleM.D.   On: 05/31/2018 13:12   Dg Chest Port 1 View  Result Date: 05/04/2018 CLINICAL DATA:  Shortness of breath. EXAM: PORTABLE CHEST 1 VIEW COMPARISON:  Chest radiograph 05/02/2018 FINDINGS: Monitoring leads overlie the patient. Stable cardiomegaly. Mild bilateral interstitial opacities bilaterally. No pleural effusion or pneumothorax. Read demonstrated osseous metastatic disease. IMPRESSION: Mild bilateral interstitial opacities may represent edema or atypical infectious process. Electronically Signed   By: DLovey NewcomerM.D.   On: 05/04/2018 13:59   Vas [place]oreaLower Extremity Venous (dvt)  Result Date: 05/06/2018  Lower Venous Study Indications: Swelling.  Risk Factors: DVT H/O 05/02/2017 obesity. Performing Technologist: VToma CopierRVS  Examination Guidelines: A complete evaluation includes B-mode imaging, spectral Doppler, color Doppler, and power Doppler as needed of all accessible portions of each vessel. Bilateral testing is considered an integral part of a complete examination. Limited examinations for reoccurring indications may be performed as noted.  Right Venous Findings: +---------+---------------+---------+-----------+----------+-------------------+          CompressibilityPhasicitySpontaneityPropertiesSummary             +---------+---------------+---------+-----------+----------+-------------------+ CFV      Partial                 Yes                  Acute DVT resolving +---------+---------------+---------+-----------+----------+-------------------+ SFJ      Partial                                      Acute DVT resolving +---------+---------------+---------+-----------+----------+-------------------+ FV Prox  Full           Yes      Yes                  DVT resolved        +---------+---------------+---------+-----------+----------+-------------------+ FV Mid   Full                                                              +---------+---------------+---------+-----------+----------+-------------------+  FV DistalFull           Yes      Yes                  DVT resolved        +---------+---------------+---------+-----------+----------+-------------------+ PFV      Full           Yes      Yes                  DVT resolved        +---------+---------------+---------+-----------+----------+-------------------+ POP      Full           Yes      Yes                                      +---------+---------------+---------+-----------+----------+-------------------+ PTV                                                   Unable to visualize                                                       well eough for                                                            optimal evaluation. +---------+---------------+---------+-----------+----------+-------------------+ PERO                                                  Unable to visualize                                                       well enough for                                                           optimal evaluation  +---------+---------------+---------+-----------+----------+-------------------+ GSV      Partial                 Yes                  Acute SVT resolving +---------+---------------+---------+-----------+----------+-------------------+  Right Technical Findings: Not visualized segments include peroneal and posterior tbial due to body habitus.  Left Venous Findings: +---------+---------------+---------+-----------+----------+-------------------+          CompressibilityPhasicitySpontaneityPropertiesSummary             +---------+---------------+---------+-----------+----------+-------------------+ CFV      Full                                                             +---------+---------------+---------+-----------+----------+-------------------+  SFJ      Full                                                              +---------+---------------+---------+-----------+----------+-------------------+ FV Prox  Full                                                             +---------+---------------+---------+-----------+----------+-------------------+ FV Mid   Full                                                             +---------+---------------+---------+-----------+----------+-------------------+ FV DistalFull                                                             +---------+---------------+---------+-----------+----------+-------------------+ PFV      Full                                                             +---------+---------------+---------+-----------+----------+-------------------+ POP      Full                                                             +---------+---------------+---------+-----------+----------+-------------------+ PTV                                                   Unable to fully                                                           visualize for                                                             optimal evaluation  +---------+---------------+---------+-----------+----------+-------------------+ PERO  Unable to fully                                                           visualize well                                                            enough for optimal                                                        evaluation          +---------+---------------+---------+-----------+----------+-------------------+  Left Technical Findings: Not visualized segments include peroneal and posterior tibial due to body habitus.   Summary: Right: Findings consistent with acute deep vein thrombosis involving the right common femoral vein. Findings consistent with acute superficial vein  thrombosis involving the right great saphenous vein. No cystic structure found in the popliteal fossa. Resolving DVT and SVT Left: There is no evidence of deep vein thrombosis in the lower extremity. No cystic structure found in the popliteal fossa.  *See table(s) above for measurements and observations. Electronically signed by Deitra Mayo MD on 05/06/2018 at 4:54:23 PM.    Final      ASSESSMENT/PLAN:  1. 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 -Unfortunately she developed bone metastasis 1 year after she stoppinganastrozole.She was treated withAI and Ibrance.Ibrance wasstopped in 11/2017 due to severe anemia. She also stoppedexemestane in late November 2019 herself. -Monthly Xgeva injection has been held since September 2019when she developed severe anemia -Wepreviouslydiscussed her CT CAP from 04/02/18 which shows disease progression in bone and metastasis to liver. 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 Continue every 2 weeks for the first few injections then monthly. She tolerated first injection well.  -Her overall clinical condition is stable overall including body pain and cough.   2. 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 She received a Zometa on April 15, 2018 for severe hypercalcemia. -Plan to restart [facility] April 25th 2020. Continue to monitor renal function and hypercalcemia.  -Corrected calcium is around 13.  Continue IV fluids.  3.Severe anemia  -She developed worsening anemia in  September 2019, lowest hemoglobin 3.9, likely secondary to [place] and diffuse bone metastasis  -She is a Jehovah witness, refuses blood transfusion -She has been on Aranesp injection since then, anemia slowly improving. Will continue 522m injections every 2 weeks, will stop if Hb above 7-8.  -will continue B12 injection monthly -HG at 4.0 today which is stable.  She has no active bleeding.  Received Aranesp 500 mcg on 06/01/2018.   4. Right LE DVT, Recent PE -She was advised to restarted Lovenox 85m but  stopped on her own due to the concerns of bleeding -She was recently hospitalized on 02/27/18 for multiple PE. This is likely related to metastatic cancer, immobility and Aranesp injections.Patient wishes to continue Aranesp injection despite the risk of thrombosis. Nunzio Cory repeatedlydeclined thrombectomy and Angovac which was offeredby IR Dr. Geroge Baseman  -Her ankle swelling had significantly decreased but still has stable swelling in her knee and upper leg. Stable.  -Due to her recent weight loss, She is currnetly on Lovenox 100 mg every 12 hours, continue.   5. Cough and Dyspnea -f/u with pulmonologist Dr. Melvyn [place] -on Flovent,Cough improved with Hycodan, continue  -Cough is stable now with occasional pinkish phlegm.   6. Body pain -She has been having bilateral rib cage pain, probably related to her bone metastasis -Take tramadol as needed -Body pain is manageable with tramadol,she only takes once a day. Pain mostly form aggressive cough.  -She does not feel she needs stronger pain medication.   7. Goals of care discussion -We again discussed the incurable nature of her cancer, and the overall poor prognosis, especially if she does not have good response to chemotherapy or progress on chemo -The patient understands the goal of care is palliative. -I recommend DNR/DNI,wepreviouslyhad long conversation about her goal of care, her friends also encouraged her to consider  DNR,she will think about it. -Palliative care has been consulted.  8.  A. fib with RVR -Likely secondary to severe anemia. -Was initially started on Cardizem drip and now transitioned to a beta-blocker. -Rate is controlled with beta-blocker.  Plan -Agree with palliative care consult. -Continue to monitor hemoglobin.  Hemoglobin is currently at baseline.  She is on Aranesp 500 mcg every 2 weeks.  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 -Hypercalcemia of malignancy.  Continue hydration. -Continue Lovenox for her history of PE and DVT. -Continue beta-blocker for new onset A. fib with RVR.   LOS: 3 days   Mikey Bussing, DNP, AGPCNP-BC, AOCNP 06/02/18   Addendum  I have seen the patient, examined her. I agree with the assessment and and plan and have edited the notes.   She was admitted for new onset AF with RVR. She was in sinus rhythm when I saw her. I am concerned about her hypercalcemia, which has been recurrent and is secondary to her underline metastatic breast cancer. I will give one dose of zometa tomorrow If Cr is normal. Continue hydration. Her chronic anemia, cough and body are stable. I will f/u as needed when she is here, and will set up f/u in my clinic. I appreciate the excellent care from the hospitalist team.  [name]  06/02/2018

## 2018-06-02 NOTE — Progress Notes (Signed)
Physical Therapy Treatment Patient Details Name: Michaela Little MRN: [id number] DOB: 1960/05/06 Today's Date: 06/02/2018    History of Present Illness Pt is a 58 y.o. female admitted 05/30/18 with SOB and chest discomfort. Worked up for afib with RVR. PMH includes anemia (baseline Hgb of 4), asthma, OSA, breast CA with bone metastases.    PT Comments    Pt admitted with above diagnosis. Pt currently with functional limitations due to balance and endurance deficits. Pt BP initially 112/70 in supine with HR 68bpm.  Sitting BP 120/66 with HR 66 bpm.  Standing BP was 128/103 with HR 72 bpm.  At end of rx, BP was 122/44.  Pt feeling weak but not dizzy and not orthostatic therefore was able to ambulate with RW to hallway and back. Pt c/o left LE pain that is worse than earlier this am therfore notified nursing.  Pt progressing slowly.    Pt will benefit from skilled PT to increase their independence and safety with mobility to allow discharge to the venue listed below.     Follow Up Recommendations  Home health PT;Supervision - Intermittent     Equipment Recommendations  None recommended by PT    Recommendations for Other Services       Precautions / Restrictions Precautions Precautions: Fall;Other (comment) Precaution Comments: Low hgb (per chart, baseline 4), no blood products Restrictions Weight Bearing Restrictions: No    Mobility  Bed Mobility Overal bed mobility: Modified Independent                Transfers Overall transfer level: Needs assistance Equipment used: Rolling walker (2 wheeled) Transfers: Sit to/from Stand Sit to Stand: Min guard            Ambulation/Gait Ambulation/Gait assistance: Min guard Gait Distance (Feet): 40 Feet Assistive device: Rolling walker (2 wheeled) Gait Pattern/deviations: Step-to pattern;Decreased stride length;Trunk flexed Gait velocity: Decreased Gait velocity interpretation: <1.31 ft/sec, indicative of household  ambulator General Gait Details: Pt ambulated to hallway and back. Had to take a standing rest break leaning back against wall prior to returning to room.  Good safety overall with RW.    Stairs             Wheelchair Mobility    Modified Rankin (Stroke Patients Only)       Balance Overall balance assessment: Needs assistance Sitting-balance support: No upper extremity supported;Feet supported Sitting balance-Leahy Scale: Good     Standing balance support: Bilateral upper extremity supported;During functional activity Standing balance-Leahy Scale: Poor Standing balance comment: relies on RW for support                            Cognition Arousal/Alertness: Awake/alert Behavior During Therapy: Flat affect Overall Cognitive Status: Within Functional Limits for tasks assessed                                        Exercises      General Comments        Pertinent Vitals/Pain Pain Assessment: Faces Faces Pain Scale: Hurts a little bit Pain Location: Chest Pain Descriptors / Indicators: Discomfort Pain Intervention(s): Limited activity within patient's tolerance;Monitored during session;Repositioned    Home Living                      Prior Function  PT Goals (current goals can now be found in the care plan section) Progress towards PT goals: Progressing toward goals    Frequency    Min 3X/week      PT Plan Current plan remains appropriate    Co-evaluation              AM-PAC PT "6 Clicks" Mobility   Outcome Measure  Help needed turning from your back to your side while in a flat bed without using bedrails?: None Help needed moving from lying on your back to sitting on the side of a flat bed without using bedrails?: None Help needed moving to and from a bed to a chair (including a wheelchair)?: A Little Help needed standing up from a chair using your arms (e.g., wheelchair or bedside chair)?: A  Little Help needed to walk in hospital room?: A Little Help needed climbing 3-5 steps with a railing? : A Little 6 Click Score: 20    End of Session Equipment Utilized During Treatment: Gait belt Activity Tolerance: Patient limited by fatigue Patient left: in bed;with call bell/phone within reach;with family/visitor present Nurse Communication: Mobility status PT Visit Diagnosis: Other abnormalities of gait and mobility (R26.89)     Time: 5397-6734 PT Time Calculation (min) (ACUTE ONLY): 23 min  Charges:  $Gait Training: 23-37 mins                     Ahilyn Nell,PT Acute Rehabilitation Services Pager:  [phone]  Office:  [place] 06/02/2018, 2:18 PM

## 2018-06-03 ENCOUNTER — Inpatient Hospital Stay (HOSPITAL_COMMUNITY): Payer: Medicare Other

## 2018-06-03 DIAGNOSIS — I10 Essential (primary) hypertension: Secondary | ICD-10-CM

## 2018-06-03 DIAGNOSIS — R112 Nausea with vomiting, unspecified: Secondary | ICD-10-CM

## 2018-06-03 LAB — CBC WITH DIFFERENTIAL/PLATELET
Abs Immature Granulocytes: 0.18 10*3/uL — ABNORMAL HIGH (ref 0.00–0.07)
BASOS PCT: 1 %
Basophils Absolute: 0 10*3/uL (ref 0.0–0.1)
EOS ABS: 0.1 10*3/uL (ref 0.0–0.5)
Eosinophils Relative: 3 %
HCT: 14.8 % — ABNORMAL LOW (ref 36.0–46.0)
Hemoglobin: 4.2 g/dL — CL (ref 12.0–15.0)
Immature Granulocytes: 4 %
Lymphocytes Relative: 23 %
Lymphs Abs: 0.9 10*3/uL (ref 0.7–4.0)
MCH: 28.2 pg (ref 26.0–34.0)
MCHC: 28.4 g/dL — ABNORMAL LOW (ref 30.0–36.0)
MCV: 99.3 fL (ref 80.0–100.0)
Monocytes Absolute: 0.4 10*3/uL (ref 0.1–1.0)
Monocytes Relative: 11 %
Neutro Abs: 2.4 10*3/uL (ref 1.7–7.7)
Neutrophils Relative %: 58 %
Platelets: 132 10*3/uL — ABNORMAL LOW (ref 150–400)
RBC: 1.49 MIL/uL — ABNORMAL LOW (ref 3.87–5.11)
RDW: 20.1 % — ABNORMAL HIGH (ref 11.5–15.5)
WBC: 4.1 10*3/uL (ref 4.0–10.5)
nRBC: 7.4 % — ABNORMAL HIGH (ref 0.0–0.2)

## 2018-06-03 LAB — PATHOLOGIST SMEAR REVIEW

## 2018-06-03 MED ORDER — TRAMADOL HCL 50 MG PO TABS: 100.0000 mg | ORAL_TABLET | Freq: Four times a day (QID) | ORAL | Status: DC | PRN

## 2018-06-03 MED ORDER — SENNOSIDES-DOCUSATE SODIUM 8.6-50 MG PO TABS
2.0000 | ORAL_TABLET | Freq: Two times a day (BID) | ORAL | Status: DC
  Administered 2018-06-03 – 2018-06-06 (×4): 2 via ORAL
  Filled 2018-06-03 (×5): qty 2

## 2018-06-03 MED ORDER — FUROSEMIDE 10 MG/ML IJ SOLN
40.0000 mg | Freq: Once | INTRAMUSCULAR | Status: AC
  Administered 2018-06-03: 40 mg via INTRAVENOUS
  Filled 2018-06-03: qty 4

## 2018-06-03 MED ORDER — FUROSEMIDE 10 MG/ML IJ SOLN: 40.0000 mg | Freq: Once | INTRAMUSCULAR | Status: DC

## 2018-06-03 MED ORDER — MORPHINE SULFATE (PF) 2 MG/ML IV SOLN: 0.5000 mg | INTRAVENOUS | Status: DC | PRN

## 2018-06-03 MED ORDER — POLYETHYLENE GLYCOL 3350 17 G PO PACK
17.0000 g | PACK | Freq: Two times a day (BID) | ORAL | Status: DC
  Administered 2018-06-03 – 2018-06-04 (×2): 17 g via ORAL
  Filled 2018-06-03 (×5): qty 1

## 2018-06-03 NOTE — Progress Notes (Signed)
Physical Therapy Treatment Patient Details Name: Michaela Little MRN: [id number] DOB: Mar 29, 1960 Today's Date: 06/03/2018    History of Present Illness Pt is a 58 y.o. female admitted 05/30/18 with SOB and chest discomfort. Worked up for afib with RVR. PMH includes anemia (baseline Hgb of 4), asthma, OSA, breast CA with bone metastases.    PT Comments    Pt admitted with above diagnosis. Pt currently with functional limitations due to balance and endurance deficits.  Pt was able to get to 3N1 and urinate and then transferred back to bed.  Pt states, "I just can't walk today. "  Will continue to progress as able.  Agree with OT to update D/C plan therefore changed recommendation to SNF.  Unsure if family can provide 24 hour care and if they can [facility] may still be appropriate.  Will continue to follow for PT needs.   Pt will benefit from skilled PT to increase their independence and safety with mobility to allow discharge to the venue listed below.     Follow Up Recommendations  SNF;Supervision/Assistance - 24 hour     Equipment Recommendations  None recommended by PT    Recommendations for Other Services       Precautions / Restrictions Precautions Precautions: Fall;Other (comment) Precaution Comments: Low hgb (per chart, baseline 4), no blood products Restrictions Weight Bearing Restrictions: No    Mobility  Bed Mobility Overal bed mobility: Needs Assistance Bed Mobility: Supine to Sit     Supine to sit: Min guard     General bed mobility comments: Min guard to bring legs into bed  Transfers Overall transfer level: Needs assistance Equipment used: Rolling walker (2 wheeled) Transfers: Sit to/from [facility] Sit to Stand: Min guard Stand pivot transfers: Min guard       General transfer comment: Stood and pivoted to 3N1 and urinated.  Was able to clean herself.  Tried to get pt to walk.   Pt declined walking.    Ambulation/Gait                  Stairs             Wheelchair Mobility    Modified Rankin (Stroke Patients Only)       Balance Overall balance assessment: Needs assistance Sitting-balance support: No upper extremity supported;Feet supported Sitting balance-Leahy Scale: Good     Standing balance support: Bilateral upper extremity supported;During functional activity Standing balance-Leahy Scale: Poor Standing balance comment: relies on RW for support                            Cognition Arousal/Alertness: Lethargic Behavior During Therapy: Flat affect Overall Cognitive Status: Within Functional Limits for tasks assessed                                        Exercises      General Comments General comments (skin integrity, edema, etc.): No family present today      Pertinent Vitals/Pain Pain Assessment: No/denies pain    Home Living                      Prior Function            PT Goals (current goals can now be found in the care plan section) Acute Rehab PT Goals Patient Stated  Goal: none stated Progress towards PT goals: Not progressing toward goals - comment(Pt somewhat self limiting)    Frequency    Min 3X/week      PT Plan Discharge plan needs to be updated    Co-evaluation              AM-PAC PT "6 Clicks" Mobility   Outcome Measure  Help needed turning from your back to your side while in a flat bed without using bedrails?: None Help needed moving from lying on your back to sitting on the side of a flat bed without using bedrails?: None Help needed moving to and from a bed to a chair (including a wheelchair)?: A Little Help needed standing up from a chair using your arms (e.g., wheelchair or bedside chair)?: A Little Help needed to walk in hospital room?: A Little Help needed climbing 3-5 steps with a railing? : A Little 6 Click Score: 20    End of Session Equipment Utilized During Treatment: Gait belt Activity  Tolerance: Patient limited by fatigue Patient left: in bed;with call bell/phone within reach Nurse Communication: Mobility status PT Visit Diagnosis: Other abnormalities of gait and mobility (R26.89)     Time: 3736-6815 PT Time Calculation (min) (ACUTE ONLY): 19 min  Charges:  $Therapeutic Activity: 8-22 mins                     Shironda Kain,PT Acute Rehabilitation Services Pager:  [phone]  Office:  [place] 06/03/2018, 10:29 AM

## 2018-06-03 NOTE — TOC Initial Note (Addendum)
Transition of Care [facility]) - Initial/Assessment Note    Patient Details  Name: Michaela Little MRN: [id number] Date of Birth: Dec 28, 1960  Transition of Care [facility]) CM/SW Contact:    Arlis Porta, [place] Work Phone Number: 06/03/2018, 3:37 PM  Clinical Narrative: SW Intern met with patient at bedside. Patient was alert and oriented x4. Intern introduced self and role in discharge plan, and discussed physical therapy recommendation to go to SNF. Patient was apprehensive to the idea of SNF, and was not readily agreeable, but did not refuse SNF. Patient states that she is most concerned about her social security income and keeping her currently place. SW Intern informed her that insurance should cover SNF for rehab, but SSI income might be used if she were to stay for long-term care. She states that she would like to discuss rehab with her brothers and sisters who have been, and would make her decision.   Patient stated that she thought that she was planning to return home, where she receive support from a home health aid, two hours per per day, seven days a week. However, she states that she would need more home health hours. Aid assists with ADLS, like bathing and getting to the bathroom. Patient receive some support from daughter, but states that the daughter works full time and has a sickly husband who requires care.   Patient is also currently receiving chemotherapy treatments at [facility], about every two weeks.   CSW to follow up to discuss insurance and discuss if she would like to have referrals sent to SNFs.   Expected Discharge Plan: Skilled Nursing Facility Barriers to Discharge: Insurance Authorization, Continued Medical Work up   Patient Goals and CMS Choice Patient states their goals for this hospitalization and ongoing recovery are:: Patient would like to return home. CMS Medicare.gov Compare Post Acute Care list provided to:: Other (Comment Required)(Patient  has not confirmed if she wants SNF or home.) Choice offered to / list presented to : NA  Expected Discharge Plan and Services Expected Discharge Plan: [place] arrangements for the past 2 months: Single Family Home                          Prior Living Arrangements/Services Living arrangements for the past 2 months: Single Family Home Lives with:: Self Patient language and need for interpreter reviewed:: No Do you feel safe going back to the place where you live?: Yes      Need for Family Participation in Patient Care: Yes (Comment) Care giver support system in place?: No (comment) Current home services: Homehealth aide Criminal Activity/Legal Involvement Pertinent to Current Situation/Hospitalization: No - Comment as needed  Activities of Daily Living Home Assistive Devices/Equipment: Environmental consultant (specify type), Wheelchair ADL Screening (condition at time of admission) Patient's cognitive ability adequate to safely complete daily activities?: Yes Is the patient deaf or have difficulty hearing?: No Does the patient have difficulty seeing, even when wearing glasses/contacts?: No Does the patient have difficulty concentrating, remembering, or making decisions?: No Patient able to express need for assistance with ADLs?: Yes Does the patient have difficulty dressing or bathing?: No Independently performs ADLs?: Yes (appropriate for developmental age) Does the patient have difficulty walking or climbing stairs?: No Weakness of Legs: Both Weakness of Arms/Hands: None  Permission Sought/Granted Permission sought to share information with : Other (comment)(Permission not granted.) Permission granted to share information with : No  Emotional Assessment Appearance:: Appears stated age Attitude/Demeanor/Rapport: Avoidant, Apprehensive, Lethargic Affect (typically observed): Flat, Apprehensive Orientation: : Oriented to Self, Oriented to Place,  Oriented to  Time, Oriented to Situation Alcohol / Substance Use: Not Applicable Psych Involvement: No (comment)  Admission diagnosis:  SOB (shortness of breath) [R06.02] Atrial fibrillation, unspecified type [facility]) [I48.91] Patient Active Problem List   Diagnosis Date Noted  . Palliative care encounter   . Atrial fibrillation with RVR ([place]) 05/30/2018  . New onset a-fib ([place]) 05/30/2018  . Malnutrition of moderate degree 05/04/2018  . Hypercalcemia 04/18/2018  . Metastatic breast cancer ([place])   . Pulmonary embolism ([place]) 02/27/2018  . Acute deep vein thrombosis (DVT) of proximal vein of right lower extremity ([place]) 01/22/2018  . 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  . Pancytopenia ([place]) 01/02/2018  . Patient is Jehov[employer]'s Witness   . Refusal of blood transfusions as patient is Jehovah's Witness   . High risk medication use 09/25/2017  . Lower extremity edema 09/25/2017  . S/P laparoscopic appendectomy 08/22/2017  . Goals of care, counseling/discussion 01/20/2017  . Multinodular thyroid 02/08/2016  . Lumbar back pain with radiculopathy affecting left lower extremity 02/08/2016  . Carcinoma of breast metastatic to bone ([place]) 02/08/2016  . Bone metastases ([place]) 11/13/2015  . Glaucoma suspect of right eye 04/18/2015  . Right thyroid nodule 06/30/2014  . Allergic rhinitis due to pollen 06/30/2014  . History of left mastectomy 01/15/2014  . History of uterine fibroid 01/15/2014  . Advance directive on file 01/14/2014  . Essential hypertension 12/24/2013  . Left ventricular diastolic dysfunction with preserved systolic function 72/11/4707  . Amblyopia, both eyes 07/29/2013  . Cataract 07/29/2013  . High myopia 07/29/2013  . Morbid (severe) obesity due to excess calories ([place]) 06/23/2013  . GERD (gastroesophageal reflux disease) 03/25/2013  . Constipation 03/25/2013  . Other and unspecified hyperlipidemia 03/25/2013  . Malignant neoplasm of overlapping sites of left breast in  female, estrogen receptor positive ([place]) 03/05/2013  . Hyperlipidemia 11/13/2011  . Dysplasia of cervix, low grade (CIN 1) 08/22/2011  . Diastolic dysfunction 62/83/6629  . Influenza 03/04/2011  . Obesity 03/04/2011  . OSA (obstructive sleep apnea) 02/27/2011  . Asthma 02/08/2011   PCP:  Ladell Pier, MD Pharmacy:   [facility]- [place], [place] - [address] [place] [address] Phone: [phone] Fax: [place], [place], [place] [address] Phone: [phone] Fax: [phone]  [place], [place] - [place] [address] Phone: [phone] Fax: [phone]     Social Determinants of Health (SDOH) Interventions    Readmission Risk Interventions 30 Day Unplanned Readmission Risk Score     ED to Hosp-Admission (Current) from 05/30/2018 in [place] [facility]  30 Day Unplanned Readmission Risk Score (%)  35 Filed at 06/03/2018 1200     This score is the patient's risk of an unplanned readmission within 30 days of being discharged (0 -100%). The score is based on dignosis, age, lab data, medications, orders, and past utilization.   Low:  0-14.9   Medium: 15-21.9   High: 22-29.9   Extreme: 30 and above       No flowsheet data found.

## 2018-06-03 NOTE — Progress Notes (Signed)
PROGRESS NOTE    Michaela Little  ZHY:[id number] DOB: [birth year] DOA: 05/30/2018 PCP: Ladell Pier, MD    Brief Narrative:  58 y.o.femalewith medical history significant ofmetastatic breast cancer, pulmonary embolism, DVT, severe anemia, on Lovenox, Jehovah's Witness with hemoglobin at about 4-5, presented to the emergency room with shortness of breath and chest discomfort. EMS found her with heart rate of 172 with A. fib.Her blood pressures were fairly stable in the emergency room. Hemoglobin is 5.2 which is historically best.  Twelve-lead EKG shows A. fib with RVR. Patient was started on Cardizem infusion in the emergency room.    Assessment & Plan:   Principal Problem:   Atrial fibrillation with RVR (HCC) Active Problems:   Asthma   OSA (obstructive sleep apnea)   Hyperlipidemia   Malignant neoplasm of overlapping sites of left breast in female, estrogen receptor positive (HCC)   GERD (gastroesophageal reflux disease)   Essential hypertension   Bone metastases (HCC)   Refusal of blood transfusions as patient is Jehovah's Witness   Pulmonary embolism ([place])   New onset a-fib ([place])   Palliative care encounter   Atrial fibrillation with RVR Paroxysmal Rate controlled and currently off Cardizem drip.  Patient is on beta-blocker, continue the same.  Patient goes in and out of A. fib and sinus rhythm.  She had an echocardiogram from January 2020.    Hypercalcemia of malignancy Patient's calcium level today is greater than 13.  She was given a dose of Zometa.  Increase IV fluids to 100 mL/h and 1 dose of IV Lasix ordered.   Severe normocytic anemia Patient's baseline hemoglobin is around 4. Patient is Jehovah witness and she is refusing blood transfusions at this time. She denies any active bleeding at this time. Minimize blood draws.    History of PE and DVT Patient has IVC filter and she is on Lovenox at home to be continued.    Metastatic breast  cancer Status post left mastectomy and mets to bone and liver.  Oncology consulted and recommendations given. Palliative care consulted.   DVT prophylaxis: lovenox.  Code Status:  Full code.  Family Communication: none at bedside.  Disposition Plan: pending clinical improvement.    Consultants:   Oncology.    Procedures: none.    Antimicrobials: none.    Subjective:  RN reports nausea, vomiting.  Pt reports constipation.  Generalized body aches.   Objective: Vitals:   06/03/18 0345 06/03/18 0838 06/03/18 0914 06/03/18 1600  BP: 138/69 136/66 136/66 (!) 151/71  Pulse: 74 72  71  Resp: 11 15  13   Temp: 98 F (36.7 C)     TempSrc: Oral     SpO2: 97% 100%  100%  Weight:      Height:        Intake/Output Summary (Last 24 hours) at 06/03/2018 1722 Last data filed at 06/03/2018 1604 Gross per 24 hour  Intake 480 ml  Output 2675 ml  Net -2195 ml   Filed Weights   06/01/18 0444 06/02/18 0436 06/03/18 0341  Weight: 103.1 kg 100.2 kg 100.3 kg    Examination:  General exam: Appears calm and comfortable  Respiratory system: Clear to auscultation. Respiratory effort normal. Cardiovascular system: S1 & S2 heard, RRR. No JVD, Gastrointestinal system: Abdomen is nondistended, soft and nontender. No organomegaly or masses felt. Normal bowel sounds heard. Central nervous system: Alert and oriented. No focal neurological deficits. Extremities: 2+ leg edema Skin: No rashes, lesions or ulcers Psychiatry:  Mood &  Flat affect     Data Reviewed: I have personally reviewed following labs and imaging studies  CBC: Recent Labs  Lab 05/30/18 1308 05/31/18 0430 06/01/18 0445 06/02/18 0401 06/03/18 0332  WBC 6.7 4.1 3.4* 3.5* 4.1  NEUTROABS 2.8  --  1.9 1.9 2.4  HGB 5.2* 4.3* 4.0* 4.0* 4.2*  HCT 18.6* 15.2* 14.3* 14.4* 14.8*  MCV 100.0 100.7* 98.6 98.6 99.3  PLT 135* 103* 99* 116* 601*   Basic Metabolic Panel: Recent Labs  Lab 05/30/18 1308 05/31/18 0430  06/01/18 0445 06/02/18 0401  NA 137 139 138 138  K 3.1* 4.0 3.7 3.5  CL 105 109 108 103  CO2 21* 23 26 25   GLUCOSE 146* 121* 100* 96  BUN 14 14 12 15   CREATININE 0.97 1.02* 0.93 1.07*  CALCIUM 12.8* 12.4* 12.7* 13.0*   GFR: Estimated Creatinine Clearance: 68.1 mL/min (A) (by C-G formula based on SCr of 1.07 mg/dL (H)). Liver Function Tests: Recent Labs  Lab 05/30/18 1308 06/01/18 0445  AST 31 29  ALT 17 14  ALKPHOS 127* 104  BILITOT 1.1 1.3*  PROT 7.0 5.7*  ALBUMIN 3.2* 2.6*   No results for input(s): LIPASE, AMYLASE in the last 168 hours. No results for input(s): AMMONIA in the last 168 hours. Coagulation Profile: No results for input(s): INR, PROTIME in the last 168 hours. Cardiac Enzymes: Recent Labs  Lab 05/30/18 1836 05/30/18 2303 05/31/18 0430  TROPONINI <0.03 <0.03 <0.03   BNP (last 3 results) No results for input(s): PROBNP in the last 8760 hours. HbA1C: No results for input(s): HGBA1C in the last 72 hours. CBG: No results for input(s): GLUCAP in the last 168 hours. Lipid Profile: No results for input(s): CHOL, HDL, LDLCALC, TRIG, CHOLHDL, LDLDIRECT in the last 72 hours. Thyroid Function Tests: No results for input(s): TSH, T4TOTAL, FREET4, T3FREE, THYROIDAB in the last 72 hours. Anemia Panel: No results for input(s): VITAMINB12, FOLATE, FERRITIN, TIBC, IRON, RETICCTPCT in the last 72 hours. Sepsis Labs: No results for input(s): PROCALCITON, LATICACIDVEN in the last 168 hours.  Recent Results (from the past 240 hour(s))  Culture, Urine     Status: None   Collection Time: 05/31/18  8:18 AM  Result Value Ref Range Status   Specimen Description URINE, RANDOM  Final   Special Requests NONE  Final   Culture   Final    NO GROWTH Performed at [facility], [address], [place], [place] [address]    Report Status 06/01/2018 FINAL  Final         Radiology Studies: No results found.      Scheduled Meds: . bisacodyl  10 mg Oral Daily   . carvedilol  12.5 mg Oral BID WC  . enoxaparin (LOVENOX) injection  100 mg Subcutaneous Q12H  . feeding supplement (ENSURE ENLIVE)  237 mL Oral TID BM  . ferrous sulfate  325 mg Oral TID WC  . fluticasone  1 spray Each Nare Daily  . fluticasone furoate-vilanterol  1 puff Inhalation Daily  . folic acid  1 mg Oral Daily  . hydrALAZINE  10 mg Oral TID  . multivitamin with minerals  1 tablet Oral Q M,W,F  . polyethylene glycol  17 g Oral BID  . senna-docusate  2 tablet Oral BID   Continuous Infusions: . sodium chloride 100 mL/hr at 06/03/18 1416     LOS: 4 days    Time spent: 35 minutes    Hosie Poisson, MD [facility] Pager [phone]  If 7PM-7AM, please contact night-coverage [email or web address] Password [email or web address] 06/03/2018, 5:22 PM

## 2018-06-04 MED ORDER — ONDANSETRON HCL 4 MG/2ML IJ SOLN
4.0000 mg | Freq: Four times a day (QID) | INTRAMUSCULAR | Status: DC | PRN
  Administered 2018-06-04: 4 mg via INTRAVENOUS
  Filled 2018-06-04: qty 2

## 2018-06-04 MED ORDER — FENTANYL CITRATE (PF) 100 MCG/2ML IJ SOLN: 12.5000 ug | Freq: Once | INTRAMUSCULAR | Status: DC

## 2018-06-04 NOTE — TOC Progression Note (Addendum)
Transition of Care [facility]) - Progression Note    Patient Details  Name: Michaela Little MRN: [id number] Date of Birth: Aug 22, 1960  Transition of Care [facility]) CM/SW Contact  Estanislado Emms, LCSW Phone Number: 06/04/2018, 11:16 AM  Clinical Narrative: CSW met with patient at bedside to further discuss SNF. Patient is now agreeable to SNF and prefers either [facility] or [facility] CSW explained insurance coverage for SNF and prior approval process. CSW sent out initial SNF referrals. Awaiting bed offers. Will provide CMS SNF list and bed offers when available. Facility of choice will initiate [facility] authorization once identified. UHC auth required before patient can admit to the facility. CSW to follow.    Update 3:12 pm: Reviewed bed offers with patient. Patient chose [facility]. Patient reported she takes [facility] to her chemo appointments. Confirmed bed offer at [facility]. [place] to start [facility] authorization on patient today. Will follow and support with discharge planning.   Expected Discharge Plan: Skilled Nursing Facility Barriers to Discharge: Ship broker, Continued Medical Work up  Expected Discharge Plan and Services Expected Discharge Plan: [place]     Living arrangements for the past 2 months: Single Family Home                           Social Determinants of Health (SDOH) Interventions    Readmission Risk Interventions 30 Day Unplanned Readmission Risk Score     ED to Hosp-Admission (Current) from 05/30/2018 in [facility]  30 Day Unplanned Readmission Risk Score (%)  36 Filed at 06/04/2018 0801     This score is the patient's risk of an unplanned readmission within 30 days of being discharged (0 -100%). The score is based on dignosis, age, lab data, medications, orders, and past utilization.   Low:  0-14.9   Medium: 15-21.9   High: 22-29.9   Extreme: 30 and above       No flowsheet data found.

## 2018-06-04 NOTE — Progress Notes (Signed)
PROGRESS NOTE    KENLIE SEKI  PIR:[id number] DOB: 09/01/1960 DOA: 05/30/2018 PCP: Ladell Pier, MD     Brief Narrative:  Michaela Little is a 58 y.o.femalewith medical history significant ofmetastatic breast cancer, pulmonary embolism, DVT, severe anemia, on Lovenox, Jehovah's Witness with baseline hemoglobin at about 4-5, presented to the emergency room with shortness of breath and chest discomfort. EMS found her with heart rate of 172 with A. fib.Her blood pressures were fairly stable in the emergency room. Hemoglobin is 5.2 which is historically best. Patient was started on Cardizem infusion in the emergency room.  New events last 24 hours / Subjective: No acute events overnight, complaining of right-sided chest pain  Assessment & Plan:   Principal Problem:   Atrial fibrillation with RVR (HCC) Active Problems:   Asthma   OSA (obstructive sleep apnea)   Hyperlipidemia   Malignant neoplasm of overlapping sites of left breast in female, estrogen receptor positive (HCC)   GERD (gastroesophageal reflux disease)   Essential hypertension   Bone metastases (HCC)   Refusal of blood transfusions as patient is Jehovah's Witness   Pulmonary embolism ([place])   New onset a-fib ([place])   Palliative care encounter   Paroxysmal atrial fibrillation with RVR -Now off Cardizem drip.  Continue coreg  -Currently in normal sinus rhythm   Hypercalcemia of malignancy -S/p Zometa, IV fluids, Lasix  -Repeat Calcium level   Severe normocytic anemia -Patient's baseline hemoglobin is around 4. Patient is Jehovah witness and she is refusing blood transfusions at this time. She denies any active bleeding at this time. -Continue iron supplementation, no report of feeling constipation at this time. On miralax, senokot   History of PE and DVT -Patient has IVC filter and she is on Lovenox   Metastatic breast cancer -Status post left mastectomy and mets to bone and liver.  Oncology  consulted and recommendations given. Palliative care consulted.   DVT prophylaxis: Lovenox Code Status: Full code Family Communication: No family at bedside Disposition Plan: Pending SNF placement   Antimicrobials:  Anti-infectives (From admission, onward)   None        Objective: Vitals:   06/03/18 2038 06/03/18 2159 06/04/18 0540 06/04/18 0543  BP:  128/65 (!) 135/59   Pulse:  65 69   Resp:  20 14   Temp:  97.6 F (36.4 C) 97.7 F (36.5 C)   TempSrc:      SpO2: 100% 100% 100%   Weight:    99.1 kg  Height:        Intake/Output Summary (Last 24 hours) at 06/04/2018 1415 Last data filed at 06/04/2018 0544 Gross per 24 hour  Intake 600 ml  Output 2700 ml  Net -2100 ml   Filed Weights   06/02/18 0436 06/03/18 0341 06/04/18 0543  Weight: 100.2 kg 100.3 kg 99.1 kg    Examination:  General exam: Appears calm and comfortable, fatigued appearing  Respiratory system: Clear to auscultation. Respiratory effort normal. Cardiovascular system: S1 & S2 heard, RRR. No JVD, murmurs, rubs, gallops or clicks. No pedal edema. Gastrointestinal system: Abdomen is nondistended, soft and nontender. No organomegaly or masses felt. Normal bowel sounds heard. Central nervous system: Alert and oriented. No focal neurological deficits. Extremities: Symmetric 5 x 5 power. Skin: No rashes, lesions or ulcers Psychiatry: Judgement and insight appear normal. Mood & affect appropriate.   Data Reviewed: I have personally reviewed following labs and imaging studies  CBC: Recent Labs  Lab 05/30/18 1308 05/31/18 0430 06/01/18 0445  06/02/18 0401 06/03/18 0332  WBC 6.7 4.1 3.4* 3.5* 4.1  NEUTROABS 2.8  --  1.9 1.9 2.4  HGB 5.2* 4.3* 4.0* 4.0* 4.2*  HCT 18.6* 15.2* 14.3* 14.4* 14.8*  MCV 100.0 100.7* 98.6 98.6 99.3  PLT 135* 103* 99* 116* 338*   Basic Metabolic Panel: Recent Labs  Lab 05/30/18 1308 05/31/18 0430 06/01/18 0445 06/02/18 0401  NA 137 139 138 138  K 3.1* 4.0 3.7 3.5   CL 105 109 108 103  CO2 21* 23 26 25   GLUCOSE 146* 121* 100* 96  BUN 14 14 12 15   CREATININE 0.97 1.02* 0.93 1.07*  CALCIUM 12.8* 12.4* 12.7* 13.0*   GFR: Estimated Creatinine Clearance: 67.6 mL/min (A) (by C-G formula based on SCr of 1.07 mg/dL (H)). Liver Function Tests: Recent Labs  Lab 05/30/18 1308 06/01/18 0445  AST 31 29  ALT 17 14  ALKPHOS 127* 104  BILITOT 1.1 1.3*  PROT 7.0 5.7*  ALBUMIN 3.2* 2.6*   No results for input(s): LIPASE, AMYLASE in the last 168 hours. No results for input(s): AMMONIA in the last 168 hours. Coagulation Profile: No results for input(s): INR, PROTIME in the last 168 hours. Cardiac Enzymes: Recent Labs  Lab 05/30/18 1836 05/30/18 2303 05/31/18 0430  TROPONINI <0.03 <0.03 <0.03   BNP (last 3 results) No results for input(s): PROBNP in the last 8760 hours. HbA1C: No results for input(s): HGBA1C in the last 72 hours. CBG: No results for input(s): GLUCAP in the last 168 hours. Lipid Profile: No results for input(s): CHOL, HDL, LDLCALC, TRIG, CHOLHDL, LDLDIRECT in the last 72 hours. Thyroid Function Tests: No results for input(s): TSH, T4TOTAL, FREET4, T3FREE, THYROIDAB in the last 72 hours. Anemia Panel: No results for input(s): VITAMINB12, FOLATE, FERRITIN, TIBC, IRON, RETICCTPCT in the last 72 hours. Sepsis Labs: No results for input(s): PROCALCITON, LATICACIDVEN in the last 168 hours.  Recent Results (from the past 240 hour(s))  Culture, Urine     Status: None   Collection Time: 05/31/18  8:18 AM  Result Value Ref Range Status   Specimen Description URINE, RANDOM  Final   Special Requests NONE  Final   Culture   Final    NO GROWTH Performed at [facility], [address] [place], [place] [address]    Report Status 06/01/2018 FINAL  Final       Radiology Studies: Dg Abd 2 Views  Result Date: 06/03/2018 CLINICAL DATA:  Nausea and vomiting. Periumbilical pain. EXAM: ABDOMEN - 2 VIEW COMPARISON:  05/02/2018  FINDINGS: Colon is diffusely stool-filled. Scattered gas in the colon and small bowel. No small or large bowel distention. No free intra-abdominal air. No abnormal air-fluid levels. Surgical clips in the right upper quadrant. No radiopaque stones identified. IVC filter at the level of L2. Degenerative changes in the lumbar spine and hips. Diffuse heterogeneous appearance throughout the visualized skeleton with mottled areas of lucency suggesting bone metastasis. IMPRESSION: Nonobstructive bowel gas pattern with diffusely stool-filled colon. Diffuse bone metastasis. Electronically Signed   By: Lucienne Capers M.D.   On: 06/03/2018 20:40      Scheduled Meds: . bisacodyl  10 mg Oral Daily  . carvedilol  12.5 mg Oral BID WC  . enoxaparin (LOVENOX) injection  100 mg Subcutaneous Q12H  . feeding supplement (ENSURE ENLIVE)  237 mL Oral TID BM  . fentaNYL (SUBLIMAZE) injection  12.5 mcg Intravenous Once  . ferrous sulfate  325 mg Oral TID WC  . fluticasone  1 spray  Each Nare Daily  . fluticasone furoate-vilanterol  1 puff Inhalation Daily  . folic acid  1 mg Oral Daily  . hydrALAZINE  10 mg Oral TID  . multivitamin with minerals  1 tablet Oral Q M,W,F  . polyethylene glycol  17 g Oral BID  . senna-docusate  2 tablet Oral BID   Continuous Infusions: . sodium chloride 100 mL/hr at 06/03/18 1416     LOS: 5 days    Time spent: 40 minutes   Dessa Phi, DO [facility] [email or web address] 06/04/2018, 2:15 PM

## 2018-06-04 NOTE — Care Management Important Message (Signed)
Important Message  Patient Details  Name: Michaela Little MRN: [id number] Date of Birth: 1960/11/15   Medicare Important Message Given:  Yes    Barb Merino Melinda Gwinner 06/04/2018, 4:57 PM

## 2018-06-04 NOTE — NC FL2 (Signed)
[place] MEDICAID FL2 LEVEL OF CARE SCREENING TOOL     IDENTIFICATION  Patient Name: Michaela Little Birthdate: 1960/12/19 Sex: female Admission Date (Current Location): 05/30/2018  [facility] and [place] Number:  Herbalist and Address:  [facility] [place] [address], [place], [place] [address]      Provider Number: [id number]  Attending Physician Name and Address:  Dessa Phi, DO  Relative Name and Phone Number:  Charlotta Newton, daughter, [phone]    Current Level of Care: Hospital Recommended Level of Care: [place] Prior Approval Number:    Date Approved/Denied:   PASRR Number: [id number]  Discharge Plan: SNF    Current Diagnoses: Patient Active Problem List   Diagnosis Date Noted  . Palliative care encounter   . Atrial fibrillation with RVR ([place]) 05/30/2018  . New onset a-fib ([place]) 05/30/2018  . Malnutrition of moderate degree 05/04/2018  . Hypercalcemia 04/18/2018  . Metastatic breast cancer ([place])   . Pulmonary embolism ([place]) 02/27/2018  . Acute deep vein thrombosis (DVT) of proximal vein of right lower extremity ([place]) 01/22/2018  . 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  . Pancytopenia ([place]) 01/02/2018  . Patient is Jehovah's Witness   . Refusal of blood transfusions as patient is Jehovah's Witness   . High risk medication use 09/25/2017  . Lower extremity edema 09/25/2017  . S/P laparoscopic appendectomy 08/22/2017  . Goals of care, counseling/discussion 01/20/2017  . Multinodular thyroid 02/08/2016  . Lumbar back pain with radiculopathy affecting left lower extremity 02/08/2016  . Carcinoma of breast metastatic to bone ([place]) 02/08/2016  . Bone metastases ([place]) 11/13/2015  . Glaucoma suspect of right eye 04/18/2015  . Right thyroid nodule 06/30/2014  . Allergic rhinitis due to pollen 06/30/2014  . History of left mastectomy 01/15/2014  . History of uterine fibroid 01/15/2014  . Advance directive on  file 01/14/2014  . Essential hypertension 12/24/2013  . Left ventricular diastolic dysfunction with preserved systolic function 78/58/8502  . Amblyopia, both eyes 07/29/2013  . Cataract 07/29/2013  . High myopia 07/29/2013  . Morbid (severe) obesity due to excess calories ([place]) 06/23/2013  . GERD (gastroesophageal reflux disease) 03/25/2013  . Constipation 03/25/2013  . Other and unspecified hyperlipidemia 03/25/2013  . Malignant neoplasm of overlapping sites of left breast in female, estrogen receptor positive ([place]) 03/05/2013  . Hyperlipidemia 11/13/2011  . Dysplasia of cervix, low grade (CIN 1) 08/22/2011  . Diastolic dysfunction 77/41/2878  . Influenza 03/04/2011  . Obesity 03/04/2011  . OSA (obstructive sleep apnea) 02/27/2011  . Asthma 02/08/2011    Orientation RESPIRATION BLADDER Height & Weight     Self, Time, Situation, Place  O2(nasal cannula 2L) Continent Weight: 218 lb 8 oz (99.1 kg) Height:  5\' 7"  (170.2 cm)  BEHAVIORAL SYMPTOMS/MOOD NEUROLOGICAL BOWEL NUTRITION STATUS      Continent Diet(please see DC summary)  AMBULATORY STATUS COMMUNICATION OF NEEDS Skin   Extensive Assist Verbally Normal                       Personal Care Assistance Level of Assistance  Bathing, Feeding, Dressing Bathing Assistance: Limited assistance Feeding assistance: Independent Dressing Assistance: Limited assistance     Functional Limitations Info  Sight, Hearing, Speech Sight Info: Adequate Hearing Info: Adequate Speech Info: Adequate    SPECIAL CARE FACTORS FREQUENCY  PT (By licensed PT), OT (By licensed OT)     PT Frequency: 5x/week OT Frequency: 5x/week  Contractures Contractures Info: Not present    Additional Factors Info  Code Status, Allergies Code Status Info: Full Allergies Info: Lisinopril-hydrochlorothiazide, Adhesive (Tape), Fentanyl, Gabapentin, Hctz (Hydrochlorothiazide), Latex, Lisinopril, Losartan Potassium, Other, Oxycodone, Tums  (Calcium Carbonate Antacid)           Current Medications (06/04/2018):  This is the current hospital active medication list Current Facility-Administered Medications  Medication Dose Route Frequency Provider Last Rate Last Dose  . 0.9 %  sodium chloride infusion   Intravenous Continuous Hosie Poisson, MD 100 mL/hr at 06/03/18 1416    . acetaminophen (TYLENOL) tablet 500 mg  500 mg Oral Q6H PRN Barb Merino, MD      . albuterol (PROVENTIL) (2.5 MG/3ML) 0.083% nebulizer solution 2.5 mg  2.5 mg Nebulization Q2H PRN Barb Merino, MD      . bisacodyl (DULCOLAX) EC tablet 10 mg  10 mg Oral Daily Georgette Shell, MD   10 mg at 06/03/18 0914  . carvedilol (COREG) tablet 12.5 mg  12.5 mg Oral BID WC Barb Merino, MD   12.5 mg at 06/04/18 1028  . enoxaparin (LOVENOX) injection 100 mg  100 mg Subcutaneous Q12H Georgette Shell, MD   100 mg at 06/04/18 1028  . feeding supplement (ENSURE ENLIVE) (ENSURE ENLIVE) liquid 237 mL  237 mL Oral TID BM Barb Merino, MD   237 mL at 06/04/18 1028  . fentaNYL (SUBLIMAZE) injection 12.5 mcg  12.5 mcg Intravenous Once Kirby-Graham, Karsten Fells, NP      . ferrous sulfate tablet 325 mg  325 mg Oral TID WC Georgette Shell, MD   325 mg at 06/03/18 1630  . fluticasone (FLONASE) 50 MCG/ACT nasal spray 1 spray  1 spray Each Nare Daily Barb Merino, MD   1 spray at 06/02/18 0831  . fluticasone furoate-vilanterol (BREO ELLIPTA) 100-25 MCG/INH 1 puff  1 puff Inhalation Daily Barb Merino, MD   1 puff at 06/03/18 2038  . folic acid (FOLVITE) tablet 1 mg  1 mg Oral Daily Barb Merino, MD   1 mg at 06/03/18 7412  . hydrALAZINE (APRESOLINE) tablet 10 mg  10 mg Oral TID Barb Merino, MD   10 mg at 06/04/18 1028  . ipratropium-albuterol (DUONEB) 0.5-2.5 (3) MG/3ML nebulizer solution 3 mL  3 mL Nebulization Q6H PRN Barb Merino, MD      . morphine 2 MG/ML injection 0.5 mg  0.5 mg Intravenous Q4H PRN Hosie Poisson, MD      . multivitamin with minerals tablet  1 tablet  1 tablet Oral Q M,W,F Barb Merino, MD   1 tablet at 06/02/18 0830  . ondansetron (ZOFRAN) injection 4 mg  4 mg Intravenous Q6H PRN Gardiner Barefoot, NP   4 mg at 06/04/18 0618  . polyethylene glycol (MIRALAX / GLYCOLAX) packet 17 g  17 g Oral BID Hosie Poisson, MD   17 g at 06/03/18 2211  . senna-docusate (Senokot-S) tablet 2 tablet  2 tablet Oral BID Hosie Poisson, MD   2 tablet at 06/03/18 2211  . traMADol (ULTRAM) tablet 100 mg  100 mg Oral Q6H PRN Hosie Poisson, MD         Discharge Medications: Please see discharge summary for a list of discharge medications.  Relevant Imaging Results:  Relevant Lab Results:   Additional Information SSN: [id number]  Estanislado Emms, LCSW

## 2018-06-05 ENCOUNTER — Inpatient Hospital Stay (HOSPITAL_COMMUNITY): Payer: Medicare Other

## 2018-06-05 MED ORDER — TRAMADOL HCL 50 MG PO TABS
50.0000 mg | ORAL_TABLET | Freq: Four times a day (QID) | ORAL | Status: DC | PRN
  Administered 2018-06-05: 50 mg via ORAL
  Filled 2018-06-05: qty 1

## 2018-06-05 MED ORDER — GLYCERIN (LAXATIVE) 2.1 G RE SUPP
1.0000 | RECTAL | Status: DC | PRN
  Administered 2018-06-05: 1 via RECTAL
  Filled 2018-06-05 (×3): qty 1

## 2018-06-05 NOTE — Progress Notes (Signed)
Insurance authorization  remains pending   Thurmond Butts, MSW, [place] Social Worker [phone]

## 2018-06-05 NOTE — Progress Notes (Addendum)
PROGRESS NOTE    Michaela Little  QQP:[id number] DOB: 05-Aug-1960 DOA: 05/30/2018 PCP: Ladell Pier, MD     Brief Narrative:  Michaela Little is a 58 y.o.femalewith medical history significant ofmetastatic breast cancer, pulmonary embolism, DVT, severe anemia, on Lovenox, Jehovah's Witness with baseline hemoglobin at about 4-5, presented to the emergency room with shortness of breath and chest discomfort. EMS found her with heart rate of 172 with A. fib.Her blood pressures were fairly stable in the emergency room. Hemoglobin is 5.2 which is historically best. Patient was started on Cardizem infusion in the emergency room.  New events last 24 hours / Subjective: No acute complaints on my examination.  After my examination this morning, notified by RN that patient had complaints of right-sided rib pain, felt cracking on that side while in bed.  Assessment & Plan:   Principal Problem:   Atrial fibrillation with RVR (HCC) Active Problems:   Asthma   OSA (obstructive sleep apnea)   Hyperlipidemia   Malignant neoplasm of overlapping sites of left breast in female, estrogen receptor positive (HCC)   GERD (gastroesophageal reflux disease)   Essential hypertension   Bone metastases (HCC)   Refusal of blood transfusions as patient is Jehovah's Witness   Pulmonary embolism ([place])   New onset a-fib ([place])   Palliative care encounter   Paroxysmal atrial fibrillation with RVR -Now off Cardizem drip.  Continue coreg  -Currently in normal sinus rhythm   Hypercalcemia of malignancy -S/p Zometa, IV fluids, Lasix  -Repeat Calcium level, pending at this time  Severe normocytic anemia -Patient's baseline hemoglobin is around 4. Patient is Jehovah witness and she is refusing blood transfusions at this time. She denies any active bleeding at this time. -Continue iron supplementation, no report of feeling constipation at this time. On miralax, senokot   History of PE and DVT -Patient  has IVC filter and she is on Lovenox  Chronic hypoxemic respiratory failure -2L [place] O2 prn at baseline    Metastatic breast cancer -Status post left mastectomy and mets to bone and liver.  Oncology consulted and recommendations given. Palliative care consulted.  Right-sided chest pain -Check rib x-ray  Constipation -No bowel movement since 3/5.  Currently on MiraLAX, senna.  Trial suppository   DVT prophylaxis: Lovenox Code Status: Full code Family Communication: No family at bedside Disposition Plan: Pending SNF placement, insurance Auth pending   Antimicrobials:  Anti-infectives (From admission, onward)   None       Objective: Vitals:   06/04/18 1400 06/04/18 2041 06/05/18 0628 06/05/18 1038  BP: 101/63 (!) 114/48 (!) 107/48 (!) 109/42  Pulse:  70 73 70  Resp:      Temp: 98.6 F (37 C) 98.8 F (37.1 C) 98.3 F (36.8 C)   TempSrc: Oral     SpO2: 99% 100% 97%   Weight:   101.3 kg   Height:        Intake/Output Summary (Last 24 hours) at 06/05/2018 1317 Last data filed at 06/05/2018 1100 Gross per 24 hour  Intake 480 ml  Output 1040 ml  Net -560 ml   Filed Weights   06/03/18 0341 06/04/18 0543 06/05/18 0628  Weight: 100.3 kg 99.1 kg 101.3 kg    Examination: General exam: Appears calm and comfortable, fatigued appearing Respiratory system: Clear to auscultation. Respiratory effort normal. Cardiovascular system: S1 & S2 heard, RRR. No JVD, murmurs, rubs, gallops or clicks. No pedal edema. Gastrointestinal system: Abdomen is nondistended, soft and nontender. No organomegaly  or masses felt. Normal bowel sounds heard. Central nervous system: Alert and oriented. No focal neurological deficits. Extremities: Symmetric 5 x 5 power. Skin: No rashes, lesions or ulcers Psychiatry: Judgement and insight appear normal. Mood & affect appropriate.    Data Reviewed: I have personally reviewed following labs and imaging studies  CBC: Recent Labs  Lab 05/30/18 1308  05/31/18 0430 06/01/18 0445 06/02/18 0401 06/03/18 0332  WBC 6.7 4.1 3.4* 3.5* 4.1  NEUTROABS 2.8  --  1.9 1.9 2.4  HGB 5.2* 4.3* 4.0* 4.0* 4.2*  HCT 18.6* 15.2* 14.3* 14.4* 14.8*  MCV 100.0 100.7* 98.6 98.6 99.3  PLT 135* 103* 99* 116* 716*   Basic Metabolic Panel: Recent Labs  Lab 05/30/18 1308 05/31/18 0430 06/01/18 0445 06/02/18 0401  NA 137 139 138 138  K 3.1* 4.0 3.7 3.5  CL 105 109 108 103  CO2 21* 23 26 25   GLUCOSE 146* 121* 100* 96  BUN 14 14 12 15   CREATININE 0.97 1.02* 0.93 1.07*  CALCIUM 12.8* 12.4* 12.7* 13.0*   GFR: Estimated Creatinine Clearance: 68.4 mL/min (A) (by C-G formula based on SCr of 1.07 mg/dL (H)). Liver Function Tests: Recent Labs  Lab 05/30/18 1308 06/01/18 0445  AST 31 29  ALT 17 14  ALKPHOS 127* 104  BILITOT 1.1 1.3*  PROT 7.0 5.7*  ALBUMIN 3.2* 2.6*   No results for input(s): LIPASE, AMYLASE in the last 168 hours. No results for input(s): AMMONIA in the last 168 hours. Coagulation Profile: No results for input(s): INR, PROTIME in the last 168 hours. Cardiac Enzymes: Recent Labs  Lab 05/30/18 1836 05/30/18 2303 05/31/18 0430  TROPONINI <0.03 <0.03 <0.03   BNP (last 3 results) No results for input(s): PROBNP in the last 8760 hours. HbA1C: No results for input(s): HGBA1C in the last 72 hours. CBG: No results for input(s): GLUCAP in the last 168 hours. Lipid Profile: No results for input(s): CHOL, HDL, LDLCALC, TRIG, CHOLHDL, LDLDIRECT in the last 72 hours. Thyroid Function Tests: No results for input(s): TSH, T4TOTAL, FREET4, T3FREE, THYROIDAB in the last 72 hours. Anemia Panel: No results for input(s): VITAMINB12, FOLATE, FERRITIN, TIBC, IRON, RETICCTPCT in the last 72 hours. Sepsis Labs: No results for input(s): PROCALCITON, LATICACIDVEN in the last 168 hours.  Recent Results (from the past 240 hour(s))  Culture, Urine     Status: None   Collection Time: 05/31/18  8:18 AM  Result Value Ref Range Status   Specimen  Description URINE, RANDOM  Final   Special Requests NONE  Final   Culture   Final    NO GROWTH Performed at [facility], [address] [place], [place] [address]    Report Status 06/01/2018 FINAL  Final       Radiology Studies: Dg Abd 2 Views  Result Date: 06/03/2018 CLINICAL DATA:  Nausea and vomiting. Periumbilical pain. EXAM: ABDOMEN - 2 VIEW COMPARISON:  05/02/2018 FINDINGS: Colon is diffusely stool-filled. Scattered gas in the colon and small bowel. No small or large bowel distention. No free intra-abdominal air. No abnormal air-fluid levels. Surgical clips in the right upper quadrant. No radiopaque stones identified. IVC filter at the level of L2. Degenerative changes in the lumbar spine and hips. Diffuse heterogeneous appearance throughout the visualized skeleton with mottled areas of lucency suggesting bone metastasis. IMPRESSION: Nonobstructive bowel gas pattern with diffusely stool-filled colon. Diffuse bone metastasis. Electronically Signed   By: Lucienne Capers M.D.   On: 06/03/2018 20:40      Scheduled  Meds: . bisacodyl  10 mg Oral Daily  . carvedilol  12.5 mg Oral BID WC  . enoxaparin (LOVENOX) injection  100 mg Subcutaneous Q12H  . feeding supplement (ENSURE ENLIVE)  237 mL Oral TID BM  . fentaNYL (SUBLIMAZE) injection  12.5 mcg Intravenous Once  . ferrous sulfate  325 mg Oral TID WC  . fluticasone  1 spray Each Nare Daily  . fluticasone furoate-vilanterol  1 puff Inhalation Daily  . folic acid  1 mg Oral Daily  . hydrALAZINE  10 mg Oral TID  . multivitamin with minerals  1 tablet Oral Q M,W,F  . polyethylene glycol  17 g Oral BID  . senna-docusate  2 tablet Oral BID   Continuous Infusions:    LOS: 6 days    Time spent: 20 minutes   Dessa Phi, DO [facility] [email or web address] 06/05/2018, 1:17 PM

## 2018-06-05 NOTE — Progress Notes (Signed)
SW Intern met with patient at bedside to inform her that she would not leave hospital today. Intern shared that it will likely be tomorrow.  Seth Edwards, Social Work Intern 

## 2018-06-05 NOTE — Progress Notes (Signed)
Pt stated that she felt her right rib cage crack while resting in bed.  Very tender to touch.  Dr. Maylene Roes made aware.  Received order for xray.  Pt made aware.  Idolina Primer, RN

## 2018-06-05 NOTE — Progress Notes (Signed)
PT Cancellation Note  Patient Details Name: Michaela Little MRN: [id number] DOB: 10/26/1960   Cancelled Treatment:    Reason Eval/Treat Not Completed: Other (comment). Pt declining OOB mobility, awaiting CXR. Will follow-up for PT treatment.  Mabeline Caras, PT, DPT Acute Rehabilitation Services  Pager [phone] Office [place] 06/05/2018, 3:52 PM

## 2018-06-05 NOTE — Plan of Care (Signed)

## 2018-06-06 LAB — CALCIUM, IONIZED: Calcium, Ionized, Serum: 6.2 mg/dL — ABNORMAL HIGH (ref 4.5–5.6)

## 2018-06-06 MED ORDER — FERROUS SULFATE 325 (65 FE) MG PO TABS: 325.0000 mg | ORAL_TABLET | Freq: Three times a day (TID) | ORAL | 0 refills | Status: DC

## 2018-06-06 MED ORDER — CARVEDILOL 12.5 MG PO TABS: 12.5000 mg | ORAL_TABLET | Freq: Two times a day (BID) | ORAL | 0 refills | Status: DC

## 2018-06-06 NOTE — Progress Notes (Signed)
Physical Therapy Treatment Patient Details Name: Michaela Little MRN: [id number] DOB: [birth year] Today's Date: 06/06/2018    History of Present Illness Pt is a 58 y.o. female admitted 05/30/18 with SOB and chest discomfort. Worked up for afib with RVR. PMH includes anemia (baseline Hgb of 4), asthma, OSA, breast CA with bone metastases.    PT Comments    Patient progressing with mobility and increased ambulation distance some today.  Fatigues quickly, but kept on O2 this session.  Continue to feel she is appropriate for SNF level rehab at d/c if family not available 24/7.  PT to follow if not d/c today.    Follow Up Recommendations  SNF;Supervision/Assistance - 24 hour     Equipment Recommendations  None recommended by PT    Recommendations for Other Services       Precautions / Restrictions Precautions Precautions: Fall;Other (comment) Precaution Comments: Low hgb (per chart, baseline 4), no blood products    Mobility  Bed Mobility Overal bed mobility: Needs Assistance       Supine to sit: Supervision;HOB elevated     General bed mobility comments: increased time and effort  Transfers Overall transfer level: Needs assistance Equipment used: Rolling walker (2 wheeled) Transfers: Sit to/from [facility] Sit to Stand: Min guard Stand pivot transfers: Min guard       General transfer comment: moving quickly, assist for safety, to [facility] then back to bed to sit and get dressed  Ambulation/Gait Ambulation/Gait assistance: Min guard Gait Distance (Feet): 50 Feet Assistive device: Rolling walker (2 wheeled) Gait Pattern/deviations: Step-through pattern;Trunk flexed;Shuffle     General Gait Details: SpO2 99% maintained on 2L O2   Stairs             Wheelchair Mobility    Modified Rankin (Stroke Patients Only)       Balance Overall balance assessment: Needs assistance Sitting-balance support: No upper extremity supported;Feet  supported Sitting balance-Leahy Scale: Good     Standing balance support: Single extremity supported;During functional activity Standing balance-Leahy Scale: Poor Standing balance comment: one UE support while standing for toilet hygiene                            Cognition Arousal/Alertness: Awake/alert Behavior During Therapy: WFL for tasks assessed/performed Overall Cognitive Status: Within Functional Limits for tasks assessed                                        Exercises      General Comments        Pertinent Vitals/Pain Faces Pain Scale: Hurts little more Pain Location: rib on R Pain Descriptors / Indicators: Sore Pain Intervention(s): Monitored during session;Repositioned    Home Living                      Prior Function            PT Goals (current goals can now be found in the care plan section) Progress towards PT goals: Progressing toward goals    Frequency    Min 3X/week      PT Plan Current plan remains appropriate    Co-evaluation              AM-PAC PT "6 Clicks" Mobility   Outcome Measure  Help needed turning from your back to your side  while in a flat bed without using bedrails?: None Help needed moving from lying on your back to sitting on the side of a flat bed without using bedrails?: None Help needed moving to and from a bed to a chair (including a wheelchair)?: A Little Help needed standing up from a chair using your arms (e.g., wheelchair or bedside chair)?: A Little Help needed to walk in hospital room?: A Little Help needed climbing 3-5 steps with a railing? : A Lot 6 Click Score: 19    End of Session Equipment Utilized During Treatment: Gait belt;Oxygen Activity Tolerance: Patient tolerated treatment well Patient left: in chair;with call bell/phone within reach   PT Visit Diagnosis: Other abnormalities of gait and mobility (R26.89)     Time: 1130-1156 PT Time Calculation (min)  (ACUTE ONLY): 26 min  Charges:  $Gait Training: 8-22 mins $Therapeutic Activity: 8-22 mins                     Magda Kiel, [place] Acute Rehabilitation Services [phone] 06/06/2018    Reginia Naas 06/06/2018, 12:59 PM

## 2018-06-06 NOTE — Progress Notes (Signed)
Report given to [facility] 

## 2018-06-06 NOTE — Discharge Instructions (Signed)
Acute Pain, Adult °Acute pain is a type of pain that may last for just a few days or as long as six months. It is often related to an illness, injury, or medical procedure. Acute pain may be mild, moderate, or severe. It usually goes away once your injury has healed or you are no longer ill. °Pain can make it hard for you to do daily activities. It can cause anxiety and lead to other problems if left untreated. Treatment depends on the cause and severity of your acute pain. °Follow these instructions at home: °· Check your pain level as told by your health care provider. °· Take over-the-counter and prescription medicines only as told by your health care provider. °· If you are taking prescription pain medicine: °? Ask your health care provider about taking a stool softener or laxative to prevent constipation. °? Do not stop taking the medicine suddenly. Talk to your health care provider about how and when to discontinue prescription pain medicine. °? If your pain is severe, do not take more pills than instructed by your health care provider. °? Do not take other over-the-counter pain medicines in addition to this medicine unless told by your health care provider. °? Do not drive or operate heavy machinery while taking prescription pain medicine. °· Apply ice or heat as told by your health care provider. These may reduce swelling and pain. °· Ask your health care provider if other strategies such as distraction, relaxation, or physical therapies can help your pain. °· Keep all follow-up visits as told by your health care provider. This is important. °Contact a health care provider if: °· You have pain that is not controlled by medicine. °· Your pain does not improve or gets worse. °· You have side effects from pain medicines, such as vomiting or confusion. °Get help right away if: °· You have severe pain. °· You have trouble breathing. °· You lose consciousness. °· You have chest pain or pressure that lasts for more  than a few minutes. Along with the chest pain you may: °? Have pain or discomfort in one or both arms, your back, neck, jaw, or stomach. °? Have shortness of breath. °? Break out in a cold sweat. °? Feel nauseous. °? Become light-headed. °These symptoms may represent a serious problem that is an emergency. Do not wait to see if the symptoms will go away. Get medical help right away. Call your local emergency services (911 in the [place] Do not drive yourself to the hospital. °This information is not intended to replace advice given to you by your health care provider. Make sure you discuss any questions you have with your health care provider. °Document Released: 03/27/2015 Document Revised: 08/19/2015 Document Reviewed: 03/27/2015 °Elsevier Interactive Patient Education © 2019 [employer] ° °

## 2018-06-06 NOTE — TOC Transition Note (Signed)
Transition of Care [facility]) - CM/SW Discharge Note   Patient Details  Name: Michaela Little MRN: [id number] Date of Birth: 1960-10-23  Transition of Care [facility]) CM/SW Contact:  Gelene Mink, [place] Phone Number: 06/06/2018, 11:42 AM   Clinical Narrative:       Final next level of care: Skilled Nursing Facility Barriers to Discharge: No Barriers Identified   Patient Goals and CMS Choice Patient states their goals for this hospitalization and ongoing recovery are:: Patient would like to return home. CMS Medicare.gov Compare Post Acute Care list provided to:: Patient Choice offered to / list presented to : Patient  Discharge Placement PASRR number recieved: 06/04/18            Patient chooses bed at: [facility] Patient to be transferred to facility by: [place] [place] Name of family member notified: Philis Kendall Patient and family notified of of transfer: 06/06/18  Discharge Plan and Services               Patient will discharge to [facility]. They will go to room 101 B and number to report to is [phone]         Social Determinants of Health (SDOH) Interventions     Readmission Risk Interventions No flowsheet data found.

## 2018-06-06 NOTE — Discharge Summary (Signed)
Physician Discharge Summary  Michaela Little OJJ:[id number] DOB: [birth year] DOA: 05/30/2018  PCP: Ladell Pier, MD  Admit date: 05/30/2018 Discharge date: 06/06/2018  Admitted From: Home Disposition:  SNF   Recommendations for Outpatient Follow-up:  1. Follow up with PCP as scheduled on 3/17 2. Follow up with Dr. Burr Medico in as scheduled on 3/20  3. Please obtain serum calcium level as an outpatient 4. Monitor CBC intermittently   Discharge Condition: Stable CODE STATUS: Full  Diet recommendation: Heart healthy   Brief/Interim Summary: Michaela Little is a 58 y.o.femalewith medical history significant ofmetastatic breast cancer, pulmonary embolism, DVT, severe anemia, on Lovenox, Jehovah's Witness with baseline hemoglobin at about 4-5, presented to the emergency room with shortness of breath and chest discomfort. EMS found her with heart rate of 172 with A. fib.Her blood pressures were fairly stable in the emergency room. Hemoglobin is 5.2 which is historically best. Patient was started on Cardizem infusion in the emergency room. She converted to normal sinus rhythm.  She was maintained on oral Coreg.  Due to hypercalcemia of malignancy, she was given Zometa, IV fluid and Lasix.  Patient continued to have anemia, as she is a Sales promotion account executive Witness, she refused any blood transfusions.   Discharge Diagnoses:  Principal Problem:   Atrial fibrillation with RVR ([place]) Active Problems:   Asthma   OSA (obstructive sleep apnea)   Hyperlipidemia   Malignant neoplasm of overlapping sites of left breast in female, estrogen receptor positive (HCC)   GERD (gastroesophageal reflux disease)   Essential hypertension   Bone metastases (HCC)   Refusal of blood transfusions as patient is Jehovah's Witness   Pulmonary embolism ([place])   New onset a-fib ([place])   Palliative care encounter   Paroxysmal atrial fibrillation with RVR -Now off Cardizem drip.  Continue coreg  -Currently in normal sinus rhythm    Hypercalcemia of malignancy -S/p Zometa, IV fluids, Lasix -Monitor Calcium as outpatient. Ionized calcium level 6.2   Severe normocytic anemia -Patient's baseline hemoglobin is around 4. Patient is Jehovah witness and she is refusing blood transfusions at this time. She denies any active bleeding at this time. -Continue iron supplementation, no report of feeling constipation at this time. On miralax, senokot   History of PE and DVT -Patient has IVC filter and she is on Lovenox  Chronic hypoxemic respiratory failure -2L [place] O2 prn at baseline    Metastatic breast cancer -Status post left mastectomy and mets to bone and liver. Oncology consulted and recommendations given. Palliative care consulted.  Right-sided chest pain -X-ray revealed right lateral eighth rib fracture with callus formation suggestive of partial healing  Constipation -Resolved, bowel movement 3/12  Discharge Instructions  Discharge Instructions    Diet - low sodium heart healthy   Complete by:  As directed    Increase activity slowly   Complete by:  As directed      Allergies as of 06/06/2018      Reactions   Lisinopril-hydrochlorothiazide Itching   Adhesive [tape] Itching, Other (See Comments)   Redness   Fentanyl Other (See Comments)   GI upset and drowsiness. Only to [facility]   Gabapentin Other (See Comments)   Auditory hallucinations   Hctz [hydrochlorothiazide] Itching   Latex Itching   Lisinopril Itching   Losartan Potassium Other (See Comments)   Makes her feel "bad"   Other Other (See Comments)   Patient refuses blood for religious reasons (per her notes)   Oxycodone    GI upset, drowsy  Tums [calcium Carbonate Antacid] Hives   FRUIT-FLAVORED ONES      Medication List    STOP taking these medications   amLODipine 10 MG tablet Commonly known as:  NORVASC   Geritol Complete Tabs     TAKE these medications   acetaminophen 500 MG tablet Commonly known as:   TYLENOL Take 500 mg by mouth every 6 (six) hours as needed for mild pain.   carvedilol 12.5 MG tablet Commonly known as:  COREG Take 1 tablet (12.5 mg total) by mouth 2 (two) times daily with a meal.   cetirizine 10 MG tablet Commonly known as:  ZYRTEC Take 10 mg by mouth daily as needed for allergies or rhinitis.   cyanocobalamin 1000 MCG tablet Take 1 tablet (1,000 mcg total) by mouth daily. What changed:    when to take this  reasons to take this   docusate sodium 100 MG capsule Commonly known as:  COLACE Take 1 capsule (100 mg total) by mouth daily as needed for mild constipation. What changed:    how much to take  when to take this   enoxaparin 120 MG/0.8ML injection Commonly known as:  LOVENOX Inject 100 mg into the skin every 12 (twelve) hours.   feeding supplement (ENSURE ENLIVE) Liqd Take 237 mLs by mouth 2 (two) times daily between meals. What changed:    when to take this  additional instructions   ferrous sulfate 325 (65 FE) MG tablet Take 1 tablet (325 mg total) by mouth 3 (three) times daily with meals.   fluticasone 50 MCG/ACT nasal spray Commonly known as:  FLONASE Place 1 spray into both nostrils daily.   fluticasone furoate-vilanterol 100-25 MCG/INH Aepb Commonly known as:  Breo Ellipta Inhale 1 puff into the lungs daily. What changed:  when to take this   folic acid 1 MG tablet Commonly known as:  FOLVITE Take 1 tablet (1 mg total) by mouth daily.   furosemide 20 MG tablet Commonly known as:  LASIX Take 20 mg by mouth daily as needed for fluid.   hydrALAZINE 10 MG tablet Commonly known as:  APRESOLINE Take 1 tablet (10 mg total) by mouth 3 (three) times daily.   ipratropium-albuterol 0.5-2.5 (3) MG/3ML Soln Commonly known as:  DUONEB INHALE THREE MLS VIA NEBULIZER EVERY 6 HOURS AS NEEDED What changed:  See the new instructions.   polyethylene glycol packet Commonly known as:  MIRALAX / GLYCOLAX Take 17 g by mouth daily. What  changed:    when to take this  reasons to take this   traMADol 50 MG tablet Commonly known as:  ULTRAM Take 2 tablets (100 mg total) by mouth every 6 (six) hours. What changed:    how much to take  when to take this  reasons to take this       Contact information for follow-up providers    Ladell Pier, MD. Schedule an appointment as soon as possible for a visit in 1 week(s).   Specialty:  Internal Medicine Contact information: [place] [address] [phone]            Contact information for after-discharge care    Destination    [facility] Preferred SNF .   Service:  Skilled Nursing Contact information: [address] [address] [phone]                 Allergies  Allergen Reactions  . Lisinopril-Hydrochlorothiazide Itching  . Adhesive [Tape] Itching  and Other (See Comments)    Redness  . Fentanyl Other (See Comments)    GI upset and drowsiness. Only to [facility]  . Gabapentin Other (See Comments)    Auditory hallucinations  . Hctz [Hydrochlorothiazide] Itching  . Latex Itching  . Lisinopril Itching  . Losartan Potassium Other (See Comments)    Makes her feel "bad"   . Other Other (See Comments)    Patient refuses blood for religious reasons (per her notes)  . Oxycodone     GI upset, drowsy  . Tums [Calcium Carbonate Antacid] Hives    FRUIT-FLAVORED ONES    Consultations:  Palliative care    Procedures/Studies: Dg Chest 2 View  Result Date: 05/30/2018 CLINICAL DATA:  Dyspnea, chest pain EXAM: CHEST - 2 VIEW COMPARISON:  05/16/2018 chest radiograph. FINDINGS: Surgical clips overlie the left axilla. Stable cardiomediastinal silhouette with heart size. No pneumothorax. No pleural effusion. No overt pulmonary edema. No acute consolidative airspace disease. Stable widespread sclerotic osseous metastatic disease. IMPRESSION: No active cardiopulmonary disease. Widespread  sclerotic osseous metastatic disease is again noted. Electronically Signed   By: Ilona Sorrel M.D.   On: 05/30/2018 14:21   Dg Chest 2 View  Result Date: 05/16/2018 CLINICAL DATA:  History of breast cancer, pancytopenia EXAM: CHEST - 2 VIEW COMPARISON:  05/06/2018, 05/04/2018, 05/02/2018 FINDINGS: Persistent, unchanged in interstitial edema or inflammatory process. No focal consolidation or pleural effusion. Stable cardiomediastinal silhouette. No pneumothorax. Surgical clips in the left axilla. Heterogenous lucencies at both shoulders, unchanged. IMPRESSION: 1. No significant interval change in mild interstitial edema or inflammatory process. No focal pneumonia. 2. Skeletal metastatic disease Electronically Signed   By: Donavan Foil M.D.   On: 05/16/2018 23:18   Dg Ribs Unilateral Right  Result Date: 06/05/2018 CLINICAL DATA:  Right-sided rib pain EXAM: RIGHT RIBS - 2 VIEW COMPARISON:  05/30/2018 CXR and chest CT 05/31/2018 FINDINGS: Stable cardiomegaly with nonaneurysmal thoracic aorta. Axillary clips are seen on left. No active pulmonary disease. Widespread osseous metastatic disease is noted of the included axial and appendicular skeleton with remote right lateral eighth rib fracture with subtle callus formation and which can be seen on prior chest CT. IVC filter is seen in the right hemiabdomen as well as surgical clips from prior cholecystectomy. IMPRESSION: No particular/focal finding to explain the patient's right rib pain. There is widespread osseous metastatic disease of the axial and appendicular skeleton with a right lateral eighth rib fracture with callus formation suggesting some partial healing. Electronically Signed   By: Ashley Royalty M.D.   On: 06/05/2018 20:22   Ct Chest W Contrast  Result Date: 05/31/2018 CLINICAL DATA:  Evaluate malignancy, spinal metastatic disease EXAM: CT CHEST WITH CONTRAST TECHNIQUE: Multidetector CT imaging of the chest was performed during intravenous contrast  administration. CONTRAST:  53mL OMNIPAQUE IOHEXOL 300 MG/ML  SOLN COMPARISON:  CT abdomen pelvis, 05/03/2018, CT chest, 04/02/2018 FINDINGS: Cardiovascular: No significant vascular findings. Normal heart size. No pericardial effusion. Mediastinum/Nodes: No enlarged mediastinal, hilar, or axillary lymph nodes. Thyroid gland, trachea, and esophagus demonstrate no significant findings. Lungs/Pleura: Lungs are clear. No pleural effusion or pneumothorax. Upper Abdomen: Numerous low-attenuation ill-defined lesions of the liver. Musculoskeletal: Status post left mastectomy. There is diffuse, mixed lytic and sclerotic osseous metastatic disease involving all included osseous structures, particularly conspicuous at T7 (series 7, image 86). IMPRESSION: 1.  No CT abnormality of the lungs or intrathoracic organs. 2. Diffuse osseous metastatic disease, particularly conspicuous at T7 with a large lytic lesion, concerning for  pathologic fracture risk. 3. Hepatic metastatic disease in the partially included upper abdomen. 4.  Status post left mastectomy. Electronically Signed   By: Eddie Candle M.D.   On: 05/31/2018 13:12   Dg Abd 2 Views  Result Date: 06/03/2018 CLINICAL DATA:  Nausea and vomiting. Periumbilical pain. EXAM: ABDOMEN - 2 VIEW COMPARISON:  05/02/2018 FINDINGS: Colon is diffusely stool-filled. Scattered gas in the colon and small bowel. No small or large bowel distention. No free intra-abdominal air. No abnormal air-fluid levels. Surgical clips in the right upper quadrant. No radiopaque stones identified. IVC filter at the level of L2. Degenerative changes in the lumbar spine and hips. Diffuse heterogeneous appearance throughout the visualized skeleton with mottled areas of lucency suggesting bone metastasis. IMPRESSION: Nonobstructive bowel gas pattern with diffusely stool-filled colon. Diffuse bone metastasis. Electronically Signed   By: Lucienne Capers M.D.   On: 06/03/2018 20:40      Discharge  Exam: Vitals:   06/05/18 2203 06/06/18 0603  BP: 123/61 (!) 105/56  Pulse: 72 72  Resp:    Temp: 98 F (36.7 C) 98.4 F (36.9 C)  SpO2: 100% 99%    General: Pt is alert, awake, not in acute distress Cardiovascular: RRR, S1/S2 +, no rubs, no gallops Respiratory: CTA bilaterally, no wheezing, no rhonchi, on [place] O2  Abdominal: Soft, NT, ND, bowel sounds + Extremities: no edema, no cyanosis    The results of significant diagnostics from this hospitalization (including imaging, microbiology, ancillary and laboratory) are listed below for reference.     Microbiology: Recent Results (from the past 240 hour(s))  Culture, Urine     Status: None   Collection Time: 05/31/18  8:18 AM  Result Value Ref Range Status   Specimen Description URINE, RANDOM  Final   Special Requests NONE  Final   Culture   Final    NO GROWTH Performed at [facility], [address] [place], [place] [address]    Report Status 06/01/2018 FINAL  Final     Labs: BNP (last 3 results) Recent Labs    01/01/18 1838 02/27/18 1237 05/02/18 2221  BNP 25.8 15.6 02.7   Basic Metabolic Panel: Recent Labs  Lab 05/30/18 1308 05/31/18 0430 06/01/18 0445 06/02/18 0401  NA 137 139 138 138  K 3.1* 4.0 3.7 3.5  CL 105 109 108 103  CO2 21* 23 26 25   GLUCOSE 146* 121* 100* 96  BUN 14 14 12 15   CREATININE 0.97 1.02* 0.93 1.07*  CALCIUM 12.8* 12.4* 12.7* 13.0*   Liver Function Tests: Recent Labs  Lab 05/30/18 1308 06/01/18 0445  AST 31 29  ALT 17 14  ALKPHOS 127* 104  BILITOT 1.1 1.3*  PROT 7.0 5.7*  ALBUMIN 3.2* 2.6*   No results for input(s): LIPASE, AMYLASE in the last 168 hours. No results for input(s): AMMONIA in the last 168 hours. CBC: Recent Labs  Lab 05/30/18 1308 05/31/18 0430 06/01/18 0445 06/02/18 0401 06/03/18 0332  WBC 6.7 4.1 3.4* 3.5* 4.1  NEUTROABS 2.8  --  1.9 1.9 2.4  HGB 5.2* 4.3* 4.0* 4.0* 4.2*  HCT 18.6* 15.2* 14.3* 14.4* 14.8*  MCV 100.0 100.7* 98.6 98.6 99.3   PLT 135* 103* 99* 116* 132*   Cardiac Enzymes: Recent Labs  Lab 05/30/18 1836 05/30/18 2303 05/31/18 0430  TROPONINI <0.03 <0.03 <0.03   BNP: Invalid input(s): POCBNP CBG: No results for input(s): GLUCAP in the last 168 hours. D-Dimer No results for input(s): DDIMER in the last 72 hours.  Hgb A1c No results for input(s): HGBA1C in the last 72 hours. Lipid Profile No results for input(s): CHOL, HDL, LDLCALC, TRIG, CHOLHDL, LDLDIRECT in the last 72 hours. Thyroid function studies No results for input(s): TSH, T4TOTAL, T3FREE, THYROIDAB in the last 72 hours.  Invalid input(s): FREET3 Anemia work up No results for input(s): VITAMINB12, FOLATE, FERRITIN, TIBC, IRON, RETICCTPCT in the last 72 hours. Urinalysis    Component Value Date/Time   COLORURINE STRAW (A) 05/31/2018 1717   APPEARANCEUR CLEAR 05/31/2018 1717   LABSPEC 1.016 05/31/2018 1717   LABSPEC 1.025 01/11/2016 1233   PHURINE 6.0 05/31/2018 1717   GLUCOSEU NEGATIVE 05/31/2018 1717   GLUCOSEU Negative 01/11/2016 1233   HGBUR NEGATIVE 05/31/2018 1717   BILIRUBINUR NEGATIVE 05/31/2018 1717   BILIRUBINUR small 12/03/2016 1117   BILIRUBINUR Negative 01/11/2016 1233   KETONESUR NEGATIVE 05/31/2018 1717   PROTEINUR NEGATIVE 05/31/2018 1717   UROBILINOGEN 1.0 12/03/2016 1117   UROBILINOGEN 1.0 09/13/2016 1306   UROBILINOGEN 0.2 01/11/2016 1233   NITRITE NEGATIVE 05/31/2018 1717   LEUKOCYTESUR NEGATIVE 05/31/2018 1717   LEUKOCYTESUR Small 01/11/2016 1233   Sepsis Labs Invalid input(s): PROCALCITONIN,  WBC,  LACTICIDVEN Microbiology Recent Results (from the past 240 hour(s))  Culture, Urine     Status: None   Collection Time: 05/31/18  8:18 AM  Result Value Ref Range Status   Specimen Description URINE, RANDOM  Final   Special Requests NONE  Final   Culture   Final    NO GROWTH Performed at [facility], [place] [address] [place], [place] [address]    Report Status 06/01/2018 FINAL  Final     Patient  was seen and examined on the day of discharge and was found to be in stable condition. Time coordinating discharge: 35 minutes including assessment and coordination of care, as well as examination of the patient.   SIGNED:  Dessa Phi, DO [facility] [email or web address] 06/06/2018, 10:21 AM

## 2018-06-06 NOTE — Progress Notes (Signed)
Pt c/o intermittent numbness on right side of face associated with headache. No neurological deficits noted on neurological exam. Pt states this has been occurring "for a few months."

## 2018-06-06 NOTE — Progress Notes (Signed)
Pt states she is ready for d/c. IV D/C. Report attempted twice. [facility] came to pick up pt.

## 2018-06-10 ENCOUNTER — Ambulatory Visit: Payer: Medicare Other | Admitting: Internal Medicine

## 2018-06-11 ENCOUNTER — Other Ambulatory Visit: Payer: Self-pay

## 2018-06-11 ENCOUNTER — Non-Acute Institutional Stay: Payer: Medicare Other | Admitting: Adult Health Nurse Practitioner

## 2018-06-11 DIAGNOSIS — Z515 Encounter for palliative care: Secondary | ICD-10-CM

## 2018-06-11 NOTE — Progress Notes (Signed)
[facility] Palliative Care Consult Note Telephone: [phone]  Fax: [phone]  PATIENT NAME: Michaela Little DOB: September 30, 1960 MRN: [id number]  PRIMARY CARE PROVIDER:   Ladell Pier, MD  REFERRING PROVIDER:  Martinique Blattenberger  RESPONSIBLE PARTY:     Extended Emergency Contact Information Primary Emergency Contact: [place] Address: [address]          [place], [place] [address] [name] of [place] Phone: [phone] Relation: Daughter Secondary Emergency Contact: Renaldo Reel, [place] [address] [place] of [employer] Phone: [phone] Relation: Relative     RECOMMENDATIONS and PLAN:   1. A-fib. Patient recently in the hospital for a-fib on 05/30/2018 to 06/06/2018.  She was treated with IV cardizem and transitioned to oral coreg.  Patient was discharged to SNF for rehab.  Her plans are to return home once she is done with rehab.  Does state that therapy weakens her but states feeling stronger today than yesterday  2. 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  Patient currently not on any treatment. Was in the hospital on 05/30/2018 to 06/06/2018 for a-fib and was treated with zometia for bone deterioration due to the metastatic cancer.  Patient does not complain of heart palpitations or feeling like her heart is racing.  3.Dyspnea. Patient uses O2 @ 2L via nasal canula PRN for SOB related to her anemia.  Patient does state that she has been having to use her O2 more often since being in the hospital.  She does use a Breo inhaler and has PRN duonebs via nebulizer.    4. Anemia.  Patient's baseline hemoglobin ranges 4-5.  She does not accept blood transfusions due to her faith [employer]).  She is currently on ferrrous sulfate 325 mg TID.  She states that this makes her vomit even when taking it with food.  She states at home that she was tolerating Geritol well.  Recommend switching to [facility] for better  tolerability but she will not be getting nearly as much iron and too much of other supplements.  Could use a slow release iron supplement. The slow release iron supplement would probably be better tolerated but is more expensive depending on insurance coverage  5. Function/ADL ability: Patient is in contact with  Delana Meyer SW at [facility] for home maker services.  Patient does state that she can get around her apartment, but does not use the stove.  States that she has some days in which she is weaker than others and really needs help throughout the day.  States that at times when she over exerts herself one day she will be really tired the next.  6.  Goals of care.  Patient is a full code.  States that this is still what she wants.  Does state that she has listed her daughter, Wonda Olds, and her son, Renley Gutman, as her POAs.  States that she has filled out the MOST form but it is at home in a folder and she couldn't remember the details right now.  Patient states that she will be returning home in about 2 weeks when her therapy is up.  Will continue to follow patient at facility and at home as she was a palliative patient prior to admission to facility.  I spent 30 minutes providing this consultation,  from 9:30 to 10:00. More than 50% of the time in this consultation was spent coordinating communication.  HISTORY OF PRESENT ILLNESS:  Michaela Little is a 58 y.o. year old female with multiple medical problems including breast cancerwith new mets to bone and liver,h/o DVT,obesity, HTN, HDL, OSA. Palliative Care was asked to help address goals of care.   CODE STATUS: Full Code  PPS: 40% HOSPICE ELIGIBILITY/DIAGNOSIS: TBD  PHYSICAL EXAM:   General: patient lying in bed in NAD Cardiovascular: regular rate and rhythm Pulmonary: lung sounds clear, normal respiratory effort Abdomen: soft, nontender, + bowel sounds Extremities: no edema, no joint deformities Skin: no rashes  Neurological: Weakness but otherwise nonfocal  PAST MEDICAL HISTORY:  Past Medical History:  Diagnosis Date  . Anxiety   . Arthritis    hands, knees, hips  . Asthma   . Bronchitis   . Cancer ([place])   . Cervical stenosis (uterine cervix)   . Disorder of appendix    Enlarged  . Dyspnea on exertion   . GERD (gastroesophageal reflux disease)   . Headache(784.0)   . History of breast cancer    2010--  LEFT  s/p  mastectomy (in [place]) AND CHEMORADIATION--  NO RECURRENCE  . History of cervical dysplasia   . Hyperlipidemia   . Hypertension   . Malignant neoplasm of overlapping sites of left breast in female, estrogen receptor positive ([place]) 03/05/2013  . OSA (obstructive sleep apnea) moderate osa per study  09/2010   CPAP  , NOT USING ON REGULAR BASIS  . Pelvic pain in female   . Positive H. pylori test    08-05-2013  . Positive TB test    AS TEEN--  [place] W/ MEDS  . Refusal of blood transfusions as patient is [employer] Witness   . Seasonal allergies   . Uterine fibroid   . Wears glasses     SOCIAL HX:  Social History   Tobacco Use  . Smoking status: Former Smoker    Packs/day: 0.30    Years: 10.00    Pack years: 3.00    Types: Cigarettes    Last attempt to quit: 03/26/1988    Years since quitting: 30.2  . Smokeless tobacco: Never Used  Substance Use Topics  . Alcohol use: No    ALLERGIES:  Allergies  Allergen Reactions  . Lisinopril-Hydrochlorothiazide Itching  . Adhesive [Tape] Itching and Other (See Comments)    Redness  . Fentanyl Other (See Comments)    GI upset and drowsiness. Only to [facility]  . Gabapentin Other (See Comments)    Auditory hallucinations  . Hctz [Hydrochlorothiazide] Itching  . Latex Itching  . Lisinopril Itching  . Losartan Potassium Other (See Comments)    Makes her feel "bad"   . Other Other (See Comments)    Patient refuses blood for religious reasons (per her notes)  . Oxycodone     GI upset, drowsy  . Tums [Calcium Carbonate  Antacid] Hives    FRUIT-FLAVORED ONES     PERTINENT MEDICATIONS:  Outpatient Encounter Medications as of 06/11/2018  Medication Sig  . acetaminophen (TYLENOL) 500 MG tablet Take 500 mg by mouth every 6 (six) hours as needed for mild pain.  . carvedilol (COREG) 12.5 MG tablet Take 1 tablet (12.5 mg total) by mouth 2 (two) times daily with a meal.  . cetirizine (ZYRTEC) 10 MG tablet Take 10 mg by mouth daily as needed for allergies or rhinitis.   Marland Kitchen docusate sodium (COLACE) 100 MG capsule Take 1 capsule (100 mg total) by mouth daily as needed for mild constipation. (Patient taking differently:  Take 200 mg by mouth every other day. )  . enoxaparin (LOVENOX) 120 MG/0.8ML injection Inject 100 mg into the skin every 12 (twelve) hours.   . feeding supplement, ENSURE ENLIVE, (ENSURE ENLIVE) LIQD Take 237 mLs by mouth 2 (two) times daily between meals. (Patient taking differently: Take 237 mLs by mouth See admin instructions. Drink 237 ml's by mouth two to three times a day (Non-dairy Ensure))  . ferrous sulfate 325 (65 FE) MG tablet Take 1 tablet (325 mg total) by mouth 3 (three) times daily with meals.  . fluticasone (FLONASE) 50 MCG/ACT nasal spray Place 1 spray into both nostrils daily.  . fluticasone furoate-vilanterol (BREO ELLIPTA) 100-25 MCG/INH AEPB Inhale 1 puff into the lungs daily. (Patient taking differently: Inhale 1 puff into the lungs every evening. )  . folic acid (FOLVITE) 1 MG tablet Take 1 tablet (1 mg total) by mouth daily.  . furosemide (LASIX) 20 MG tablet Take 20 mg by mouth daily as needed for fluid.  . hydrALAZINE (APRESOLINE) 10 MG tablet Take 1 tablet (10 mg total) by mouth 3 (three) times daily.  Marland Kitchen ipratropium-albuterol (DUONEB) 0.5-2.5 (3) MG/3ML SOLN INHALE THREE MLS VIA NEBULIZER EVERY 6 HOURS AS NEEDED (Patient taking differently: Take 3 mLs by nebulization every 6 (six) hours as needed (for wheezing or shortness of breath). )  . polyethylene glycol (MIRALAX / GLYCOLAX)  packet Take 17 g by mouth daily. (Patient taking differently: Take 17 g by mouth daily as needed for mild constipation. )  . traMADol (ULTRAM) 50 MG tablet Take 2 tablets (100 mg total) by mouth every 6 (six) hours. (Patient taking differently: Take 50 mg by mouth every 6 (six) hours as needed for moderate pain or severe pain. )  . vitamin B-12 1000 MCG tablet Take 1 tablet (1,000 mcg total) by mouth daily. (Patient taking differently: Take 1,000 mcg by mouth daily as needed (for supplementation). )   No facility-administered encounter medications on file as of 06/11/2018.       Tessla Spurling Jenetta Downer, NP

## 2018-06-11 NOTE — Progress Notes (Signed)
[place]   [phone] Fax:([phone]) [phone]   Clinic Follow up Note   Patient Care Team: Ladell Pier, MD as PCP - General (Internal Medicine) [name], MD as Consulting Physician (Pulmonary Disease) [name], MD as Consulting Physician (Hematology) [name], NP as Nurse Practitioner (Hospice and Palliative Medicine) Clent Demark, PA-C as Physician Assistant (Physician Assistant)  Date of Service:  06/13/2018  CHIEF COMPLAINT: F/u of metastatic breast cancer and anemia  SUMMARY OF ONCOLOGIC HISTORY: Oncology History   Cancer Staging Malignant neoplasm of overlapping sites of left breast in female, estrogen receptor positive ([place]) Staging form: Breast, AJCC 7th Edition - Clinical: Stage IIIA (T2, N2, M0) - Unsigned       Malignant neoplasm of overlapping sites of left breast in female, estrogen receptor positive ([place])   05/2008 Cancer Diagnosis    Left sided breast cancer (no path report available)    11/2008 Pathologic Stage    Stage IIIA: T2 N2     Neo-Adjuvant Chemotherapy    Paclitaxel weekly x 12; doxorubicin and cyclophosphamide x 4 - both given with trifiparnib (treated at [facility] in [place])    11/2008 Definitive Surgery    Left modified radiation mastectomy: invasive lobular carcinoma, ER+, PR+, HER2/neu negative, 5/22 LN positive     - 03/2009 Radiation Therapy    Adjuvant radiation to left chest wall    2011 - 08/2014 Anti-estrogen oral therapy    Tamoxifen 20 mg used briefly; discontinued due to uterine lining concerns; changed to anastrozole 1 mg daily (began 07/2100); discontinued by patient summer of 2016    05/26/2015 Progression    Iliac bone biopsy showed metastatic carcinoma consistent with a breast primary, ER positive, PR and HER-2 negative. Her staging CT, and a PET scan was negative for visceral metastasis.    05/2015 - 01/2018 Anti-estrogen oral therapy    1. letrozole and Ibrance started March  2017             -Ibrance dose decreased to 75 mg daily. Stopped in 11/2017 due to anemia   -Letrozole switched to Exemestane on 02/13/17 due to b/l rib pain. She exemestane herself in 01/2018.      11/16/2015 Miscellaneous    Xgeva monthly for bone metastasis     07/03/2016 Imaging    CT chest, abdomen and pelvis with contrast showed no evidence of metastasis.     02/07/2017 Imaging    CT AP W Contrast 02/07/17 IMPRESSION: 1. No CT findings for abdominal/pelvic metastatic disease. 2. No acute abdominal findings, mass lesions or adenopathy. 3. Status post cholecystectomy with mild associated common bile duct dilatation. 4. Somewhat thickened appendix appears relatively stable. No acute inflammatory process. 5. Stable mixed lytic and sclerotic osseous metastatic disease.     02/07/2017 Imaging    Bone Scan Whole Body 02/07/17 IMPRESSION: No scintigraphic evidence skeletal metastasis     08/12/2017 Imaging    Whole Boday Scan 08/12/17 IMPRESSION: Scattered degenerative type uptake as above with a questionable focus of increased tracer localization at L2 vertebral body versus artifact; no abnormality is seen at this site by CT. Consider either characterization by MR or attention on follow-up imaging.    08/12/2017 Imaging    CT AP W Contrast 08/12/17  IMPRESSION: Continued thickening of the appendix is noted without surrounding inflammation. It has maximum measured diameter is decreased compared to prior exam. There is no evidence of acute inflammation. Stable mixed lytic and sclerotic appearance of visualized  skeleton is noted consistent with history of known osseous metastases. No acute abnormality seen in the abdomen or pelvis.    08/22/2017 Surgery    APPENDECTOMY LAPAROSCOPIC ERAS PATHWAY by Dr. Malcolm Metro and REMOVAL OF LEFT TISSUE EXPANDER by Dr. Harlow Mares 08/22/17    08/22/2017 Pathology Results    Diagnosis 08/22/17  1. Appendix, Other than Incidental - METASTATIC  CARCINOMA, CONSISTENT WITH BREAST PRIMARY. - CARCINOMA IS PRESENT AT THE SURGICAL RESECTION MARGIN AS WELL AS THE SEROSAL SURFACE. - LYMPHOVASCULAR INVASION IS IDENTIFIED. - SEE COMMENT. 2. Breast, capsule, Left - DENSE FIBROUS TISSUE WITH MILD INFLAMMATION, INCLUDING A FEW SCATTERED MULTINUCLEATED GIANT CELLS. - THERE IS NO EVIDENCE OF MALIGNANCY.    08/27/2017 Imaging    CT AP W Contrast 08/27/17 IMPRESSION: No evidence of pulmonary embolus. Small pericardial effusion with uncertain clinical significance. No evidence of acute abnormalities within the chest, abdomen or pelvis. Area of fat stranding and small focus of gas within the anterior abdominal wall, immediately inferior to the umbilicus, probably at the site of laparoscopic access.    10/07/2017 Echocardiogram    10/07/2017 ECO Study Conclusions  - Procedure narrative: Transthoracic echocardiography. Image   quality was adequate. The study was technically difficult, as a   result of poor acoustic windows. - Left ventricle: The cavity size was normal. Wall thickness was   normal. Systolic function was normal. The estimated ejection   fraction was in the range of 60% to 65%. Wall motion was normal;   there were no regional wall motion abnormalities. Features are   consistent with a pseudonormal left ventricular filling pattern,   with concomitant abnormal relaxation and increased filling   pressure (grade 2 diastolic dysfunction).    01/16/2018 PET scan    01/16/2018 PET Scan IMPRESSION: 1. Most striking finding is intense marrow activity diffusely throughout the axillary and appendicular skeleton. This is favored a physiologic marrow response from chronic anemia rather than diffuse metastatic disease. 2. Several discrete foci of more intense activity noted in the spine which could indicate malignancy or trauma. Difficult to define malignancy on the background of diffuse marrow activity. 3. Diffuse sclerosis throughout  the bones similar to a recent CT scans but new from remote PET-CT scan 03/01/2016. 4. No evidence soft tissue metastasis.     04/02/2018 Imaging    CT Angio CAP 04/02/18  IMPRESSION: 1. Multiple new hepatic lesions highly concerning for developing metastatic disease to the liver. This could be confirmed with nonemergent MRI of the abdomen with and without IV gadolinium if clinically appropriate. 2. Widespread metastatic disease to the bones also appears progressive compared to prior examinations. 3. Large right-sided pelvic deep venous thrombosis extending from the right common femoral vein to the proximal right external iliac vein. IVC filter is in position. No clot identified in the IVC filter at this time. No pulmonary embolism on today's examination. 4. Aortic atherosclerosis. 5. Additional incidental findings, as above.    05/01/2018 -  Anti-estrogen oral therapy    Fulvestrant injections 551m monthly starting 05/01/18      CURRENT THERAPY:  -Aranesp injection every 2 weeks -B12 injectionsmonthly  -Lovenox 1265mq12h, changed to 10011mvery 12 hours in early Feb 2020 (adjusted based on her weight) -Monthly fulvestrant injection500m72marting on 05/03/18 with loading dose for first month. Monthly starting 06/27/18.  -Monthly Xgeva starting 06/27/18  INTERVAL HISTORY:  CrysJennette Bankerhere for a follow up of treatment. Since last visit she was hospitalized for Afib on 05/30/18. There she  was started on Cardizem infusion in the emergency room and maintained better function on Coreg.  She presents to the clinic today by herself. She is currently in rehab post hospitalization and took their transportation here. She has been doing physical activity with walking and medicine ball.  She notes her chest pain is still significant from her aggressive coughing and feels she has rib fracture again. She feels her energy is stable and still has fatigue. She notes she has been eating but not as well  due to her distaste for her nursing home food. She plans to be discharged after next week. She note mild occasional sore throat which does not last long.  I reviewed her medication list. She will need corrected Lovenox refill. She is not taking oral iron because it is not tolerable, she has been nauseous from it. She requested a discontinuation script for this. She notes she had a bowel movement in 2 days due to oral iron but has been abel to pass gas.     REVIEW OF SYSTEMS:   Constitutional: Denies fevers, chills or abnormal weight loss  Eyes: Denies blurriness of vision Ears, nose, mouth, throat, and face: Denies mucositis (+) Occasional mild sore throat Respiratory: (+) Aggressive cough (+) chest pain (+) On continuous O2 canula, 2L  Cardiovascular: Denies palpitation, chest discomfort or lower extremity swelling Gastrointestinal:  Denies nausea, heartburn (+) Mild constipation  Skin: Denies abnormal skin rashes Lymphatics: Denies new lymphadenopathy or easy bruising Neurological:Denies numbness, tingling or new weaknesses Behavioral/Psych: Mood is stable, no new changes  All other systems were reviewed with the patient and are negative.  MEDICAL HISTORY:  Past Medical History:  Diagnosis Date  . Anxiety   . Arthritis    hands, knees, hips  . Asthma   . Bronchitis   . Cancer ([place])   . Cervical stenosis (uterine cervix)   . Disorder of appendix    Enlarged  . Dyspnea on exertion   . GERD (gastroesophageal reflux disease)   . Headache(784.0)   . History of breast cancer    2010--  LEFT  s/p  mastectomy (in [place]) AND CHEMORADIATION--  NO RECURRENCE  . History of cervical dysplasia   . Hyperlipidemia   . Hypertension   . Malignant neoplasm of overlapping sites of left breast in female, estrogen receptor positive ([place]) 03/05/2013  . OSA (obstructive sleep apnea) moderate osa per study  09/2010   CPAP  , NOT USING ON REGULAR BASIS  . Pelvic pain in female   . Positive H. pylori  test    08-05-2013  . Positive TB test    [place] TE[place]--  [place]/ MEDS  . Refusal of blood transfusions as patient is Jehovah's Witness   . Seasonal allergies   . Uterine fibroid   . Wears glasses     SURGICAL HISTORY: Past Surgical History:  Procedure Laterality Date  . APPENDECTOMY  y  . CARDIOVASCULAR STRESS TEST  10-29-2012   low risk perfusion study/  no significant reversibity/ ef 66%/  normal wall motion  . CERVICAL CONIZATION W/BX  2012   in  [place] N/A 05/14/2012   Procedure: LAPAROSCOPIC CHOLECYSTECTOMY;  Surgeon: Ralene Ok, MD;  Location: [facility];  Service: General;  Laterality: N/A;  . COLONOSCOPY  2011   normal per patient - [place]  . DILATION AND CURETTAGE OF UTERUS  10/15/2011   Procedure: DILATATION AND CURETTAGE;  Surgeon: Melina Schools, MD;  Location: [facility];  Service: Gynecology;  Laterality: N/A;  Conization &  endocervical curettings  . EXAMINATION UNDER ANESTHESIA N/A 08/20/2013   Procedure: EXAM UNDER ANESTHESIA;  Surgeon: Margarette Asal, MD;  Location: [facility];  Service: Gynecology;  Laterality: N/A;  . [place]   right  . IR IVC FILTER PLMT / S&I /IMG GUID/MOD SED  03/01/2018  . IR RADIOLOGIST EVAL & MGMT  04/08/2018  . LAPAROSCOPIC APPENDECTOMY N/A 08/22/2017   Procedure: [place];  Surgeon: Coralie Keens, MD;  Location: [facility];  Service: General;  Laterality: N/A;  . LAPAROSCOPIC ASSISTED VAGINAL HYSTERECTOMY N/A 10/26/2014   Procedure: HYSTERECTOMY ABDOMINAL ;  Surgeon: Molli Posey, MD;  Location: [facility];  Service: Gynecology;  Laterality: N/A;  . MASTECTOMY Left 11/2008  in [place]   . SALPINGOOPHORECTOMY Bilateral 10/26/2014   Procedure: BILATERAL SALPINGO OOPHORECTOMY;  Surgeon: Molli Posey, MD;  Location: [facility];  Service: Gynecology;  Laterality: Bilateral;  . TISSUE EXPANDER PLACEMENT Left 08/22/2017   Procedure: REMOVAL OF LEFT TISSUE EXPANDER;  Surgeon:  Crissie Reese, MD;  Location: [facility];  Service: Plastics;  Laterality: Left;  . TISSUE EXPANDER REMOVAL Left 08/22/2017  . TRANSTHORACIC ECHOCARDIOGRAM  10-29-2012   mild lvh/  ef 60-65%/  grade II diastolic dysfunction/  trivial mr  &  tr    I have reviewed the social history and family history with the patient and they are unchanged from previous note.  ALLERGIES:  is allergic to lisinopril-hydrochlorothiazide; adhesive [tape]; fentanyl; gabapentin; hctz [hydrochlorothiazide]; latex; lisinopril; losartan potassium; other; oxycodone; and tums [calcium carbonate antacid].  MEDICATIONS:  Current Outpatient Medications  Medication Sig Dispense Refill  . acetaminophen (TYLENOL) 500 MG tablet Take 500 mg by mouth every 6 (six) hours as needed for mild pain.    . carvedilol (COREG) 12.5 MG tablet Take 1 tablet (12.5 mg total) by mouth 2 (two) times daily with a meal. 60 tablet 0  . cetirizine (ZYRTEC) 10 MG tablet Take 10 mg by mouth daily as needed for allergies or rhinitis.   11  . docusate sodium (COLACE) 100 MG capsule Take 1 capsule (100 mg total) by mouth daily as needed for mild constipation. (Patient taking differently: Take 200 mg by mouth every other day. ) 10 capsule 0  . enoxaparin (LOVENOX) 120 MG/0.8ML injection Inject 100 mg into the skin every 12 (twelve) hours.     . feeding supplement, ENSURE ENLIVE, (ENSURE ENLIVE) LIQD Take 237 mLs by mouth 2 (two) times daily between meals. (Patient taking differently: Take 237 mLs by mouth See admin instructions. Drink 237 ml's by mouth two to three times a day (Non-dairy Ensure)) 237 mL 12  . ferrous sulfate 325 (65 FE) MG tablet Take 1 tablet (325 mg total) by mouth 3 (three) times daily with meals. 90 tablet 0  . fluticasone (FLONASE) 50 MCG/ACT nasal spray Place 1 spray into both nostrils daily.  2  . fluticasone furoate-vilanterol (BREO ELLIPTA) 100-25 MCG/INH AEPB Inhale 1 puff into the lungs daily. (Patient taking differently: Inhale 1  puff into the lungs every evening. ) 60 each 5  . folic acid (FOLVITE) 1 MG tablet Take 1 tablet (1 mg total) by mouth daily. 90 tablet 1  . furosemide (LASIX) 20 MG tablet Take 20 mg by mouth daily as needed for fluid.    . hydrALAZINE (APRESOLINE) 10 MG tablet Take 1 tablet (10 mg total) by mouth 3 (three) times daily. 90 tablet 0  .  polyethylene glycol (MIRALAX / GLYCOLAX) packet Take 17 g by mouth daily. (Patient taking differently: Take 17 g by mouth daily as needed for mild constipation. ) 14 each 0  . traMADol (ULTRAM) 50 MG tablet Take 2 tablets (100 mg total) by mouth every 6 (six) hours. (Patient taking differently: Take 50 mg by mouth every 6 (six) hours as needed for moderate pain or severe pain. ) 30 tablet 0  . vitamin B-12 1000 MCG tablet Take 1 tablet (1,000 mcg total) by mouth daily. (Patient taking differently: Take 1,000 mcg by mouth daily as needed (for supplementation). ) 30 tablet 0  . ipratropium-albuterol (DUONEB) 0.5-2.5 (3) MG/3ML SOLN INHALE THREE MLS VIA NEBULIZER EVERY 6 HOURS AS NEEDED (Patient not taking: No sig reported) 360 mL 5   No current facility-administered medications for this visit.     PHYSICAL EXAMINATION: ECOG PERFORMANCE STATUS: 3 - Symptomatic, >50% confined to bed  Vitals:   06/13/18 1120  BP: 123/61  Pulse: 70  Resp: 17  Temp: 98.4 F (36.9 C)  SpO2: 100%   Filed Weights   06/13/18 1120  Weight: 225 lb 11.2 oz (102.4 kg)    GENERAL:alert, no distress and comfortable SKIN: skin color, texture, turgor are normal, no rashes or significant lesions EYES: normal, Conjunctiva are pink and non-injected, sclera clear OROPHARYNX:no exudate, no erythema and lips, buccal mucosa, and tongue normal  NECK: supple, thyroid normal size, non-tender, without nodularity LYMPH:  no palpable lymphadenopathy in the cervical, axillary or inguinal LUNGS: clear to auscultation and percussion with normal breathing effort  (+) On continuous O2 canula, 2L   HEART: regular rate & rhythm and no murmurs and no lower extremity edema ABDOMEN:abdomen soft, non-tender and normal bowel sounds Musculoskeletal:no cyanosis of digits and no clubbing  NEURO: alert & oriented x 3 with fluent speech, no focal motor/sensory deficits  LABORATORY DATA:  I have reviewed the data as listed CBC Latest Ref Rng & Units 06/13/2018 06/03/2018 06/02/2018  WBC 4.0 - 10.5 K/uL 3.7(L) 4.1 3.5(L)  Hemoglobin 12.0 - 15.0 g/dL 4.5(LL) 4.2(LL) 4.0(LL)  Hematocrit 36.0 - 46.0 % 16.0(L) 14.8(L) 14.4(L)  Platelets 150 - 400 K/uL 136(L) 132(L) 116(L)     CMP Latest Ref Rng & Units 06/13/2018 06/02/2018 06/01/2018  Glucose 70 - 99 mg/dL 107(H) 96 100(H)  BUN 6 - 20 mg/dL 10 15 12   Creatinine 0.44 - 1.00 mg/dL 0.97 1.07(H) 0.93  Sodium 135 - 145 mmol/L 139 138 138  Potassium 3.5 - 5.1 mmol/L 4.0 3.5 3.7  Chloride 98 - 111 mmol/L 106 103 108  CO2 22 - 32 mmol/L 22 25 26   Calcium 8.9 - 10.3 mg/dL 9.6 13.0(H) 12.7(H)  Total Protein 6.5 - 8.1 g/dL 6.9 - 5.7(L)  Total Bilirubin 0.3 - 1.2 mg/dL 1.0 - 1.3(H)  Alkaline Phos 38 - 126 U/L 174(H) - 104  AST 15 - 41 U/L 33 - 29  ALT 0 - 44 U/L 15 - 14      RADIOGRAPHIC STUDIES: I have personally reviewed the radiological images as listed and agreed with the findings in the report. No results found.   ASSESSMENT & PLAN:  Michaela Little is a 58 y.o. female with   1. 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 -Unfortunately she developed bone metastasis 1 year after she stoppinganastrozole.She was treated withAI and Ibrance.Ibrance wasstopped in 11/2017 due to  severe anemia. She also stoppedexemestane in late November 2019 herself.  -Wepreviouslydiscussed her CT CAP from 04/02/18 which shows disease progression in bone and metastasis to liver.  -Given her FOgenomic test resultsshowPIK3CA mutation,I recommendedAlpelisib [facility]), along with fulvestrant injection, however she is very concerned about worsening anemia from [facility] -She started Fulvestrant injection on 05/01/18. Tolerating well.  -Monthly Xgeva injection has been held since September 2019when she developed severe anemia. Has been more stable lately, plan to start in 2 weeks. -Her clinical condition is stable overall including body pain and cough.  -Plan to rescan her in about 2 months  -Labs reviewed, WBC 3.7, Hg 4.5, PLT 136K, CMP shows WNL, . Overall adequate to proceed with fulvestrant today. She missed one loading dose 2 weeks ago when she was hospitalized, will add in 2 weeks. Then proceed with monthly injections.  -F/u in 4 weeks   2. 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 She received a Zometa on 04/15/2018 for severe hypercalcemia and again on 06/03/18.  -Plan to restart Xgevain 2 weeks on 06/27/18 -Continue to monitor renal function and hypercalcemia.   3.Severe anemia  -She developed worsening anemia in September 2019, lowest hemoglobin 3.9, likely secondary to [place] and diffuse bone metastasis  -She is a Jehovah witness, refuses blood transfusion -She has been on Aranesp injection since then, anemia slowly improving. Will continue 574m injections every 2 weeks, will stop if Hb above 7-8.  -will continue B12 injection monthly -HG at 4.5 today (06/13/18). Proceed with Aranesp and B12 injections today -She has not been able to tolerate oral iron due to constipation and nausea. I will give discontinuation script for rehab. She has not has a bowel movement in 2 days, I will prescribe lactulose today (06/13/18).   4. Right LE DVT, Recent PE -She was advised to restarted Lovenox 839m but stopped on her own due to the concerns of bleeding -She was hospitalized on  02/27/18 for multiple PE. This is likely related to metastatic cancer, immobility and Aranesp injections.Patient wishes to continue Aranesp injection despite the risk of thrombosis. -SNunzio Coryepeatedlydeclined thrombectomy and Angovac which was offeredby IR Dr. McGeroge Baseman-Her ankle swelling had significantly decreased but still has swelling in her right knee and upper leg. Stable.  -Due to her recent weight loss, She is currently on Lovenox 100 mg every 12 hours, continue.   5. Cough and Dyspnea -f/u with pulmonologist Dr. WeMelvyn Novason Flovent,Cough improved with Hycodan, continue  -05/2018 CT Chest show normal lung. -On Continuous O2 canula, 2L -Cough remains aggressive, stable.Lung sounds clear on exam today (06/13/18)  6. Body pain -She has been having bilateral rib cage pain, probably related to her bone metastasis -Take tramadol as needed once a day. She does not feel she needs stronger pain medication.  -Pain mostly from aggressive cough. She does not feel she needs stronger pain medication.  -She feels she may have another rib fracture from her aggressive coughing. Will monitor.   7. Afib -Since last visit she was hospitalized on 05/30/18 for atrial fibrillation  -She was treated with Cardizem and discharged with coreg. Continue  -She is currently in nursing home rehab and plans to be discharged after next week.   8. Goal of care discussion -We previously discussed the incurable nature of her cancer, and the overall poor prognosis, especially if she does not have good response to chemotherapy or progress on chemo -The patient understands the goal  of care is palliative. -I recommend DNR/DNI,wepreviouslyhad long conversation about her goal of care, her friends also encouraged her to consider DNR,she has not agreed with it.   Plan -Will give d/c script for oral iron and prescribe lactulose for constipation today  -Labs reviewed and adequate to proceed with fulvestrant,  Aranesp and B12 injection today.  -she will receive Aranesp, fulvestrant and Xgeva injection in 2 weeks, I woke with pharmacy  -Lab and injections every 2 weeks X3 -F/u in 4 weeks  -Continue 13m Lovenox injections BID at home    No problem-specific Assessment & Plan notes found for this encounter.   No orders of the defined types were placed in this encounter.  All questions were answered. The patient knows to call the clinic with any problems, questions or concerns. No barriers to learning was detected. I spent 20 minutes counseling the patient face to face. The total time spent in the appointment was 25 minutes and more than 50% was on counseling and review of test results     YTruitt Merle MD 06/13/2018   I, AJoslyn Devon am acting as scribe for YTruitt Merle MD.   I have reviewed the above documentation for accuracy and completeness, and I agree with the above.

## 2018-06-13 ENCOUNTER — Telehealth: Payer: Self-pay | Admitting: Hematology

## 2018-06-13 ENCOUNTER — Other Ambulatory Visit: Payer: Self-pay

## 2018-06-13 ENCOUNTER — Encounter: Payer: Self-pay | Admitting: Hematology

## 2018-06-13 ENCOUNTER — Inpatient Hospital Stay: Payer: Medicare Other

## 2018-06-13 ENCOUNTER — Inpatient Hospital Stay (HOSPITAL_BASED_OUTPATIENT_CLINIC_OR_DEPARTMENT_OTHER): Payer: Medicare Other | Admitting: Hematology

## 2018-06-13 VITALS — BP 123/61 | HR 70 | Temp 98.4°F | Resp 17 | Ht 67.0 in | Wt 225.7 lb

## 2018-06-13 DIAGNOSIS — C7951 Secondary malignant neoplasm of bone: Secondary | ICD-10-CM | POA: Diagnosis present

## 2018-06-13 DIAGNOSIS — Z170 Estrogen receptor positive status [ER+]: Secondary | ICD-10-CM

## 2018-06-13 DIAGNOSIS — R0600 Dyspnea, unspecified: Secondary | ICD-10-CM

## 2018-06-13 DIAGNOSIS — C785 Secondary malignant neoplasm of large intestine and rectum: Secondary | ICD-10-CM

## 2018-06-13 DIAGNOSIS — R05 Cough: Secondary | ICD-10-CM

## 2018-06-13 DIAGNOSIS — Z7189 Other specified counseling: Secondary | ICD-10-CM

## 2018-06-13 DIAGNOSIS — I4891 Unspecified atrial fibrillation: Secondary | ICD-10-CM

## 2018-06-13 DIAGNOSIS — R079 Chest pain, unspecified: Secondary | ICD-10-CM

## 2018-06-13 DIAGNOSIS — D649 Anemia, unspecified: Secondary | ICD-10-CM

## 2018-06-13 DIAGNOSIS — C50812 Malignant neoplasm of overlapping sites of left female breast: Secondary | ICD-10-CM

## 2018-06-13 DIAGNOSIS — I82402 Acute embolism and thrombosis of unspecified deep veins of left lower extremity: Secondary | ICD-10-CM

## 2018-06-13 DIAGNOSIS — C50912 Malignant neoplasm of unspecified site of left female breast: Secondary | ICD-10-CM

## 2018-06-13 DIAGNOSIS — I2699 Other pulmonary embolism without acute cor pulmonale: Secondary | ICD-10-CM

## 2018-06-13 DIAGNOSIS — Z5111 Encounter for antineoplastic chemotherapy: Secondary | ICD-10-CM | POA: Diagnosis present

## 2018-06-13 LAB — CMP (CANCER CENTER ONLY)
ALT: 15 U/L (ref 0–44)
AST: 33 U/L (ref 15–41)
Albumin: 3 g/dL — ABNORMAL LOW (ref 3.5–5.0)
Alkaline Phosphatase: 174 U/L — ABNORMAL HIGH (ref 38–126)
Anion gap: 11 (ref 5–15)
BUN: 10 mg/dL (ref 6–20)
CO2: 22 mmol/L (ref 22–32)
Calcium: 9.6 mg/dL (ref 8.9–10.3)
Chloride: 106 mmol/L (ref 98–111)
Creatinine: 0.97 mg/dL (ref 0.44–1.00)
Glucose, Bld: 107 mg/dL — ABNORMAL HIGH (ref 70–99)
Potassium: 4 mmol/L (ref 3.5–5.1)
Sodium: 139 mmol/L (ref 135–145)
TOTAL PROTEIN: 6.9 g/dL (ref 6.5–8.1)
Total Bilirubin: 1 mg/dL (ref 0.3–1.2)

## 2018-06-13 LAB — CBC WITH DIFFERENTIAL/PLATELET
Abs Immature Granulocytes: 0.19 10*3/uL — ABNORMAL HIGH (ref 0.00–0.07)
Basophils Absolute: 0 10*3/uL (ref 0.0–0.1)
Basophils Relative: 0 %
Eosinophils Absolute: 0.1 10*3/uL (ref 0.0–0.5)
Eosinophils Relative: 3 %
HCT: 16 % — ABNORMAL LOW (ref 36.0–46.0)
Hemoglobin: 4.5 g/dL — CL (ref 12.0–15.0)
Immature Granulocytes: 5 %
Lymphocytes Relative: 26 %
Lymphs Abs: 1 10*3/uL (ref 0.7–4.0)
MCH: 29.2 pg (ref 26.0–34.0)
MCHC: 28.1 g/dL — ABNORMAL LOW (ref 30.0–36.0)
MCV: 103.9 fL — ABNORMAL HIGH (ref 80.0–100.0)
Monocytes Absolute: 0.4 10*3/uL (ref 0.1–1.0)
Monocytes Relative: 10 %
Neutro Abs: 2.1 10*3/uL (ref 1.7–7.7)
Neutrophils Relative %: 56 %
Platelets: 136 10*3/uL — ABNORMAL LOW (ref 150–400)
RBC: 1.54 MIL/uL — ABNORMAL LOW (ref 3.87–5.11)
RDW: 21.6 % — ABNORMAL HIGH (ref 11.5–15.5)
WBC: 3.7 10*3/uL — ABNORMAL LOW (ref 4.0–10.5)
nRBC: 7.3 % — ABNORMAL HIGH (ref 0.0–0.2)

## 2018-06-13 MED ORDER — FULVESTRANT 250 MG/5ML IM SOLN
INTRAMUSCULAR | Status: AC
  Filled 2018-06-13: qty 5

## 2018-06-13 MED ORDER — CYANOCOBALAMIN 1000 MCG/ML IJ SOLN
INTRAMUSCULAR | Status: AC
  Filled 2018-06-13: qty 1

## 2018-06-13 MED ORDER — DARBEPOETIN ALFA 500 MCG/ML IJ SOSY
PREFILLED_SYRINGE | INTRAMUSCULAR | Status: AC
  Filled 2018-06-13: qty 1

## 2018-06-13 MED ORDER — FULVESTRANT 250 MG/5ML IM SOLN
500.0000 mg | INTRAMUSCULAR | Status: DC
  Administered 2018-06-13: 500 mg via INTRAMUSCULAR

## 2018-06-13 MED ORDER — DARBEPOETIN ALFA 500 MCG/ML IJ SOSY
500.0000 ug | PREFILLED_SYRINGE | Freq: Once | INTRAMUSCULAR | Status: AC
  Administered 2018-06-13: 500 ug via SUBCUTANEOUS

## 2018-06-13 MED ORDER — CYANOCOBALAMIN 1000 MCG/ML IJ SOLN
1000.0000 ug | Freq: Once | INTRAMUSCULAR | Status: AC
  Administered 2018-06-13: 1000 ug via INTRAMUSCULAR

## 2018-06-13 NOTE — Patient Instructions (Signed)
Darbepoetin Alfa injection What is this medicine? DARBEPOETIN ALFA (dar be POE e tin AL fa) helps your body make more red blood cells. It is used to treat anemia caused by chronic kidney failure and chemotherapy. This medicine may be used for other purposes; ask your health care provider or pharmacist if you have questions. COMMON BRAND NAME(S): Aranesp What should I tell my health care provider before I take this medicine? They need to know if you have any of these conditions: -blood clotting disorders or history of blood clots -cancer patient not on chemotherapy -cystic fibrosis -heart disease, such as angina, heart failure, or a history of a heart attack -hemoglobin level of 12 g/dL or greater -high blood pressure -low levels of folate, iron, or vitamin B12 -seizures -an unusual or allergic reaction to darbepoetin, erythropoietin, albumin, hamster proteins, latex, other medicines, foods, dyes, or preservatives -pregnant or trying to get pregnant -breast-feeding How should I use this medicine? This medicine is for injection into a vein or under the skin. It is usually given by a health care professional in a hospital or clinic setting. If you get this medicine at home, you will be taught how to prepare and give this medicine. Use exactly as directed. Take your medicine at regular intervals. Do not take your medicine more often than directed. It is important that you put your used needles and syringes in a special sharps container. Do not put them in a trash can. If you do not have a sharps container, call your pharmacist or healthcare provider to get one. A special MedGuide will be given to you by the pharmacist with each prescription and refill. Be sure to read this information carefully each time. Talk to your pediatrician regarding the use of this medicine in children. While this medicine may be used in children as young as 1 month of age for selected conditions, precautions do  apply. Overdosage: If you think you have taken too much of this medicine contact a poison control center or emergency room at once. NOTE: This medicine is only for you. Do not share this medicine with others. What if I miss a dose? If you miss a dose, take it as soon as you can. If it is almost time for your next dose, take only that dose. Do not take double or extra doses. What may interact with this medicine? Do not take this medicine with any of the following medications: -epoetin alfa This list may not describe all possible interactions. Give your health care provider a list of all the medicines, herbs, non-prescription drugs, or dietary supplements you use. Also tell them if you smoke, drink alcohol, or use illegal drugs. Some items may interact with your medicine. What should I watch for while using this medicine? Your condition will be monitored carefully while you are receiving this medicine. You may need blood work done while you are taking this medicine. This medicine may cause a decrease in vitamin B6. You should make sure that you get enough vitamin B6 while you are taking this medicine. Discuss the foods you eat and the vitamins you take with your health care professional. What side effects may I notice from receiving this medicine? Side effects that you should report to your doctor or health care professional as soon as possible: -allergic reactions like skin rash, itching or hives, swelling of the face, lips, or tongue -breathing problems -changes in vision -chest pain -confusion, trouble speaking or understanding -feeling faint or lightheaded, falls -high blood   pressure -muscle aches or pains -pain, swelling, warmth in the leg -rapid weight gain -severe headaches -sudden numbness or weakness of the face, arm or leg -trouble walking, dizziness, loss of balance or coordination -seizures (convulsions) -swelling of the ankles, feet, hands -unusually weak or tired Side  effects that usually do not require medical attention (report to your doctor or health care professional if they continue or are bothersome): -diarrhea -fever, chills (flu-like symptoms) -headaches -nausea, vomiting -redness, stinging, or swelling at site where injected This list may not describe all possible side effects. Call your doctor for medical advice about side effects. You may report side effects to FDA at 1-800-FDA-1088. Where should I keep my medicine? Keep out of the reach of children. Store in a refrigerator between 2 and 8 degrees C (36 and 46 degrees F). Do not freeze. Do not shake. Throw away any unused portion if using a single-dose vial. Throw away any unused medicine after the expiration date. NOTE: This sheet is a summary. It may not cover all possible information. If you have questions about this medicine, talk to your doctor, pharmacist, or health care provider.  2019 Elsevier/Gold Standard (2017-03-27 16:44:20) Fulvestrant injection What is this medicine? FULVESTRANT (ful VES trant) blocks the effects of estrogen. It is used to treat breast cancer. This medicine may be used for other purposes; ask your health care provider or pharmacist if you have questions. COMMON BRAND NAME(S): FASLODEX What should I tell my health care provider before I take this medicine? They need to know if you have any of these conditions: -bleeding disorders -liver disease -low blood counts, like low white cell, platelet, or red cell counts -an unusual or allergic reaction to fulvestrant, other medicines, foods, dyes, or preservatives -pregnant or trying to get pregnant -breast-feeding How should I use this medicine? This medicine is for injection into a muscle. It is usually given by a health care professional in a hospital or clinic setting. Talk to your pediatrician regarding the use of this medicine in children. Special care may be needed. Overdosage: If you think you have taken too  much of this medicine contact a poison control center or emergency room at once. NOTE: This medicine is only for you. Do not share this medicine with others. What if I miss a dose? It is important not to miss your dose. Call your doctor or health care professional if you are unable to keep an appointment. What may interact with this medicine? -medicines that treat or prevent blood clots like warfarin, enoxaparin, dalteparin, apixaban, dabigatran, and rivaroxaban This list may not describe all possible interactions. Give your health care provider a list of all the medicines, herbs, non-prescription drugs, or dietary supplements you use. Also tell them if you smoke, drink alcohol, or use illegal drugs. Some items may interact with your medicine. What should I watch for while using this medicine? Your condition will be monitored carefully while you are receiving this medicine. You will need important blood work done while you are taking this medicine. Do not become pregnant while taking this medicine or for at least 1 year after stopping it. Women of child-bearing potential will need to have a negative pregnancy test before starting this medicine. Women should inform their doctor if they wish to become pregnant or think they might be pregnant. There is a potential for serious side effects to an unborn child. Men should inform their doctors if they wish to father a child. This medicine may lower sperm counts. Talk  to your health care professional or pharmacist for more information. Do not breast-feed an infant while taking this medicine or for 1 year after the last dose. What side effects may I notice from receiving this medicine? Side effects that you should report to your doctor or health care professional as soon as possible: -allergic reactions like skin rash, itching or hives, swelling of the face, lips, or tongue -feeling faint or lightheaded, falls -pain, tingling, numbness, or weakness in the  legs -signs and symptoms of infection like fever or chills; cough; flu-like symptoms; sore throat -vaginal bleeding Side effects that usually do not require medical attention (report to your doctor or health care professional if they continue or are bothersome): -aches, pains -constipation -diarrhea -headache -hot flashes -nausea, vomiting -pain at site where injected -stomach pain This list may not describe all possible side effects. Call your doctor for medical advice about side effects. You may report side effects to FDA at 1-800-FDA-1088. Where should I keep my medicine? This drug is given in a hospital or clinic and will not be stored at home. NOTE: This sheet is a summary. It may not cover all possible information. If you have questions about this medicine, talk to your doctor, pharmacist, or health care provider.  2019 Elsevier/Gold Standard (2017-06-20 11:34:41) Cyanocobalamin, Pyridoxine, and Folate What is this medicine? A multivitamin containing folic acid, vitamin B6, and vitamin B12. This medicine may be used for other purposes; ask your health care provider or pharmacist if you have questions. COMMON BRAND NAME(S): AllanFol RX, AllanTex, Av-Vite FB, B Complex with Folic Acid, ComBgen, FaBB, Folamin, Folastin, [place], [place], [place], [place], [place], [employer], [place] RX 2.2, [place], [place] 2.2, Foltabs 800, Foltx, Homocysteine Formula, Niva-Fol, NuFol, [employer], Virt-Gard, Virt-Vite, Virt-Vite [place], Vita-Respa What should I tell my health care provider before I take this medicine? They need to know if you have any of these conditions: -bleeding or clotting disorder -history of anemia of any type -other chronic health condition -an unusual or allergic reaction to vitamins, other medicines, foods, dyes, or preservatives -pregnant or trying to get pregnant -breast-feeding How should I use this medicine? Take by mouth with a glass of water. May take with food.  Follow the directions on the prescription label. It is usually given once a day. Do not take your medicine more often than directed. Contact your pediatrician regarding the use of this medicine in children. Special care may be needed. Overdosage: If you think you have taken too much of this medicine contact a poison control center or emergency room at once. NOTE: This medicine is only for you. Do not share this medicine with others. What if I miss a dose? If you miss a dose, take it as soon as you can. If it is almost time for your next dose, take only that dose. Do not take double or extra doses. What may interact with this medicine? -levodopa This list may not describe all possible interactions. Give your health care provider a list of all the medicines, herbs, non-prescription drugs, or dietary supplements you use. Also tell them if you smoke, drink alcohol, or use illegal drugs. Some items may interact with your medicine. What should I watch for while using this medicine? See your health care professional for regular checks on your progress. Remember that vitamin supplements do not replace the need for good nutrition from a balanced diet. What side effects may I notice from receiving this medicine? Side effects that you should report to  your doctor or health care professional as soon as possible: -allergic reaction such as skin rash or difficulty breathing -vomiting Side effects that usually do not require medical attention (report to your doctor or health care professional if they continue or are bothersome): -nausea -stomach upset This list may not describe all possible side effects. Call your doctor for medical advice about side effects. You may report side effects to FDA at 1-800-FDA-1088. Where should I keep my medicine? Keep out of the reach of children. Most vitamins should be stored at controlled room temperature. Check your specific product directions. Protect from heat and  moisture. Throw away any unused medicine after the expiration date. NOTE: This sheet is a summary. It may not cover all possible information. If you have questions about this medicine, talk to your doctor, pharmacist, or health care provider.  2019 Elsevier/Gold Standard (2007-05-03 00:59:55)

## 2018-06-13 NOTE — Telephone Encounter (Signed)
Scheduled appt per 3/20 los. ° °Printed calendar and avs. °

## 2018-06-27 ENCOUNTER — Other Ambulatory Visit: Payer: Medicare Other

## 2018-06-27 ENCOUNTER — Ambulatory Visit: Payer: Medicare Other

## 2018-06-30 ENCOUNTER — Other Ambulatory Visit: Payer: Self-pay

## 2018-06-30 ENCOUNTER — Inpatient Hospital Stay: Payer: Medicare Other | Attending: Hematology

## 2018-06-30 ENCOUNTER — Inpatient Hospital Stay: Payer: Medicare Other

## 2018-06-30 DIAGNOSIS — D649 Anemia, unspecified: Secondary | ICD-10-CM | POA: Insufficient documentation

## 2018-06-30 DIAGNOSIS — Z5111 Encounter for antineoplastic chemotherapy: Secondary | ICD-10-CM | POA: Diagnosis present

## 2018-06-30 DIAGNOSIS — C50812 Malignant neoplasm of overlapping sites of left female breast: Secondary | ICD-10-CM | POA: Diagnosis present

## 2018-06-30 DIAGNOSIS — C7951 Secondary malignant neoplasm of bone: Secondary | ICD-10-CM | POA: Insufficient documentation

## 2018-06-30 DIAGNOSIS — C50912 Malignant neoplasm of unspecified site of left female breast: Secondary | ICD-10-CM

## 2018-06-30 DIAGNOSIS — Z170 Estrogen receptor positive status [ER+]: Secondary | ICD-10-CM

## 2018-06-30 LAB — CBC WITH DIFFERENTIAL/PLATELET
Abs Immature Granulocytes: 0.11 10*3/uL — ABNORMAL HIGH (ref 0.00–0.07)
Basophils Absolute: 0 10*3/uL (ref 0.0–0.1)
Basophils Relative: 1 %
Eosinophils Absolute: 0.1 10*3/uL (ref 0.0–0.5)
Eosinophils Relative: 4 %
HCT: 22.1 % — ABNORMAL LOW (ref 36.0–46.0)
Hemoglobin: 6.3 g/dL — CL (ref 12.0–15.0)
Immature Granulocytes: 3 %
Lymphocytes Relative: 28 %
Lymphs Abs: 1 10*3/uL (ref 0.7–4.0)
MCH: 28.9 pg (ref 26.0–34.0)
MCHC: 28.5 g/dL — ABNORMAL LOW (ref 30.0–36.0)
MCV: 101.4 fL — ABNORMAL HIGH (ref 80.0–100.0)
Monocytes Absolute: 0.4 10*3/uL (ref 0.1–1.0)
Monocytes Relative: 11 %
Neutro Abs: 2 10*3/uL (ref 1.7–7.7)
Neutrophils Relative %: 53 %
Platelets: 125 10*3/uL — ABNORMAL LOW (ref 150–400)
RBC: 2.18 MIL/uL — ABNORMAL LOW (ref 3.87–5.11)
RDW: 19.9 % — ABNORMAL HIGH (ref 11.5–15.5)
WBC: 3.7 10*3/uL — ABNORMAL LOW (ref 4.0–10.5)
nRBC: 9.4 % — ABNORMAL HIGH (ref 0.0–0.2)

## 2018-06-30 LAB — CMP (CANCER CENTER ONLY)
ALT: 21 U/L (ref 0–44)
AST: 54 U/L — ABNORMAL HIGH (ref 15–41)
Albumin: 3.5 g/dL (ref 3.5–5.0)
Alkaline Phosphatase: 213 U/L — ABNORMAL HIGH (ref 38–126)
Anion gap: 11 (ref 5–15)
BUN: 17 mg/dL (ref 6–20)
CO2: 21 mmol/L — ABNORMAL LOW (ref 22–32)
Calcium: 12.5 mg/dL — ABNORMAL HIGH (ref 8.9–10.3)
Chloride: 105 mmol/L (ref 98–111)
Creatinine: 1.13 mg/dL — ABNORMAL HIGH (ref 0.44–1.00)
GFR, Est AFR Am: >60 mL/min (ref >60)
GFR, Estimated: 53 mL/min — ABNORMAL LOW (ref >60)
Glucose, Bld: 124 mg/dL — ABNORMAL HIGH (ref 70–99)
Potassium: 3.8 mmol/L (ref 3.5–5.1)
Sodium: 137 mmol/L (ref 135–145)
Total Bilirubin: 1.2 mg/dL (ref 0.3–1.2)
Total Protein: 7.6 g/dL (ref 6.5–8.1)

## 2018-06-30 MED ORDER — FULVESTRANT 250 MG/5ML IM SOLN
500.0000 mg | INTRAMUSCULAR | Status: DC
  Administered 2018-06-30: 500 mg via INTRAMUSCULAR

## 2018-06-30 MED ORDER — DARBEPOETIN ALFA 500 MCG/ML IJ SOSY
PREFILLED_SYRINGE | INTRAMUSCULAR | Status: AC
  Filled 2018-06-30: qty 1

## 2018-06-30 MED ORDER — FULVESTRANT 250 MG/5ML IM SOLN
INTRAMUSCULAR | Status: AC
  Filled 2018-06-30: qty 5

## 2018-06-30 MED ORDER — DENOSUMAB 120 MG/1.7ML ~~LOC~~ SOLN
SUBCUTANEOUS | Status: AC
  Filled 2018-06-30: qty 1.7

## 2018-06-30 MED ORDER — DENOSUMAB 120 MG/1.7ML ~~LOC~~ SOLN
120.0000 mg | Freq: Once | SUBCUTANEOUS | Status: AC
  Administered 2018-06-30: 120 mg via SUBCUTANEOUS

## 2018-06-30 MED ORDER — DARBEPOETIN ALFA 500 MCG/ML IJ SOSY
500.0000 ug | PREFILLED_SYRINGE | Freq: Once | INTRAMUSCULAR | Status: AC
  Administered 2018-06-30: 500 ug via SUBCUTANEOUS

## 2018-06-30 NOTE — Progress Notes (Signed)
Patient ID: Michaela Little, female   DOB: 11-11-1960, 58 y.o.   MRN: [id number]  Virtual Visit via Telephone Note  I connected with Michaela Little on 06/30/18 at  3:00 PM EDT by telephone and verified that I am speaking with the correct person using two identifiers.   I discussed the limitations, risks, security and privacy concerns of performing an evaluation and management service by telephone and the availability of in person appointments. I also discussed with the patient that there may be a patient responsible charge related to this service. The patient expressed understanding and agreed to proceed.   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  Since the last visit in our clinic the patient did require an admission to the hospital.  The patient had atrial fibrillation and required intravenous Cardizem and placement then subsequently on low-dose beta-blocker.  The Lovenox was continued.  Patient also had hypercalcemia and she was treated for this related to the malignancy.  She states she is on the Breo inhaler for her breathing but has not helped as much.  The patient has a dry cough.  Mucus is minimal but thick and white.  There is no hemoptysis.  She still has chest pain.  Note there is been a rib fracture healing on the right.  After hospitalization she went to a nursing home for rehab for 3 weeks then return  back to the home environment.  She is been seen by palliative medicine in the past however they have not been back to see her since she has been back home from the nursing home.  The patient continues to follow with oncology for treatment with high-risk medications.  She does have tramadol and she uses this as needed and his does help with her symptoms. With a recent visit she had symptomatic anemia hemoglobin down to 4-1/2 she was able to fill the hemoglobin back up to 6.5  Note she was offered Ensure supplement but she is not been able to afford this medication.  She does have a home health aide.  She would like to see advanced home care reengage along with the palliative medicine team.   Observations/Objective: This was a telephone visit and the patient was not observed [place] databased controlled substance reviewed  Assessment and Plan: #1 atrial fibrillation paroxysmal now in sinus rhythm  Will continue Coreg and have administered refills for the patient and continue the Lovenox for DVT prophylaxis as well  #2 recurrent venous thrombosis secondary to hypercoagulable state from malignancy: We will continue the Lovenox 100 mg subcutaneous daily  #3 moderate persistent asthma: We will maintain Breo inhaler 1 puff daily With the associated persistent cough and chest pain associated with this will prescribe Hycodan cough syrup for cough suppression  #4 malnutrition to a significant degree  For this we will recommend Ensure supplementation our nurse case manager will investigate a mechanism to obtain this for the patient  #5 palliative care patient  Will work with palliative medicine for  a referral to come to see the patient in the home environment    Follow Up Instructions: The patient understands and knows that the Hycodan was sent to her pharmacy for delivery and also refills on the Coreg were made for future use, along with the folic acid  Another tele-visit will be made in the coming  weeks   I discussed the assessment and treatment plan with the patient. The patient was provided an opportunity to ask questions and all were answered. The patient agreed with the plan and demonstrated an understanding of the instructions.   The patient was advised to call back or seek an in-person evaluation if the symptoms worsen or if the condition fails to improve as anticipated.  I provided 15 minutes of non-face-to-face time during this encounter.   Asencion Noble, MD

## 2018-06-30 NOTE — Patient Instructions (Signed)
Denosumab injection What is this medicine? DENOSUMAB (den oh sue mab) slows bone breakdown. Prolia is used to treat osteoporosis in women after menopause and in men, and in people who are taking corticosteroids for 6 months or more. Xgeva is used to treat a high calcium level due to cancer and to prevent bone fractures and other bone problems caused by multiple myeloma or cancer bone metastases. Xgeva is also used to treat giant cell tumor of the bone. This medicine may be used for other purposes; ask your health care provider or pharmacist if you have questions. COMMON BRAND NAME(S): Prolia, XGEVA What should I tell my health care provider before I take this medicine? They need to know if you have any of these conditions: -dental disease -having surgery or tooth extraction -infection -kidney disease -low levels of calcium or Vitamin D in the blood -malnutrition -on hemodialysis -skin conditions or sensitivity -thyroid or parathyroid disease -an unusual reaction to denosumab, other medicines, foods, dyes, or preservatives -pregnant or trying to get pregnant -breast-feeding How should I use this medicine? This medicine is for injection under the skin. It is given by a health care professional in a hospital or clinic setting. A special MedGuide will be given to you before each treatment. Be sure to read this information carefully each time. For Prolia, talk to your pediatrician regarding the use of this medicine in children. Special care may be needed. For Xgeva, talk to your pediatrician regarding the use of this medicine in children. While this drug may be prescribed for children as young as 13 years for selected conditions, precautions do apply. Overdosage: If you think you have taken too much of this medicine contact a poison control center or emergency room at once. NOTE: This medicine is only for you. Do not share this medicine with others. What if I miss a dose? It is important not to  miss your dose. Call your doctor or health care professional if you are unable to keep an appointment. What may interact with this medicine? Do not take this medicine with any of the following medications: -other medicines containing denosumab This medicine may also interact with the following medications: -medicines that lower your chance of fighting infection -steroid medicines like prednisone or cortisone This list may not describe all possible interactions. Give your health care provider a list of all the medicines, herbs, non-prescription drugs, or dietary supplements you use. Also tell them if you smoke, drink alcohol, or use illegal drugs. Some items may interact with your medicine. What should I watch for while using this medicine? Visit your doctor or health care professional for regular checks on your progress. Your doctor or health care professional may order blood tests and other tests to see how you are doing. Call your doctor or health care professional for advice if you get a fever, chills or sore throat, or other symptoms of a cold or flu. Do not treat yourself. This drug may decrease your body's ability to fight infection. Try to avoid being around people who are sick. You should make sure you get enough calcium and vitamin D while you are taking this medicine, unless your doctor tells you not to. Discuss the foods you eat and the vitamins you take with your health care professional. See your dentist regularly. Brush and floss your teeth as directed. Before you have any dental work done, tell your dentist you are receiving this medicine. Do not become pregnant while taking this medicine or for 5 months   after stopping it. Talk with your doctor or health care professional about your birth control options while taking this medicine. Women should inform their doctor if they wish to become pregnant or think they might be pregnant. There is a potential for serious side effects to an unborn  child. Talk to your health care professional or pharmacist for more information. What side effects may I notice from receiving this medicine? Side effects that you should report to your doctor or health care professional as soon as possible: -allergic reactions like skin rash, itching or hives, swelling of the face, lips, or tongue -bone pain -breathing problems -dizziness -jaw pain, especially after dental work -redness, blistering, peeling of the skin -signs and symptoms of infection like fever or chills; cough; sore throat; pain or trouble passing urine -signs of low calcium like fast heartbeat, muscle cramps or muscle pain; pain, tingling, numbness in the hands or feet; seizures -unusual bleeding or bruising -unusually weak or tired Side effects that usually do not require medical attention (report to your doctor or health care professional if they continue or are bothersome): -constipation -diarrhea -headache -joint pain -loss of appetite -muscle pain -runny nose -tiredness -upset stomach This list may not describe all possible side effects. Call your doctor for medical advice about side effects. You may report side effects to FDA at 1-800-FDA-1088. Where should I keep my medicine? This medicine is only given in a clinic, doctor's office, or other health care setting and will not be stored at home. NOTE: This sheet is a summary. It may not cover all possible information. If you have questions about this medicine, talk to your doctor, pharmacist, or health care provider.  2019 Elsevier/Gold Standard (2017-07-19 16:10:44) Darbepoetin Alfa injection What is this medicine? DARBEPOETIN ALFA (dar be POE e tin AL fa) helps your body make more red blood cells. It is used to treat anemia caused by chronic kidney failure and chemotherapy. This medicine may be used for other purposes; ask your health care provider or pharmacist if you have questions. COMMON BRAND NAME(S): Aranesp What  should I tell my health care provider before I take this medicine? They need to know if you have any of these conditions: -blood clotting disorders or history of blood clots -cancer patient not on chemotherapy -cystic fibrosis -heart disease, such as angina, heart failure, or a history of a heart attack -hemoglobin level of 12 g/dL or greater -high blood pressure -low levels of folate, iron, or vitamin B12 -seizures -an unusual or allergic reaction to darbepoetin, erythropoietin, albumin, hamster proteins, latex, other medicines, foods, dyes, or preservatives -pregnant or trying to get pregnant -breast-feeding How should I use this medicine? This medicine is for injection into a vein or under the skin. It is usually given by a health care professional in a hospital or clinic setting. If you get this medicine at home, you will be taught how to prepare and give this medicine. Use exactly as directed. Take your medicine at regular intervals. Do not take your medicine more often than directed. It is important that you put your used needles and syringes in a special sharps container. Do not put them in a trash can. If you do not have a sharps container, call your pharmacist or healthcare provider to get one. A special MedGuide will be given to you by the pharmacist with each prescription and refill. Be sure to read this information carefully each time. Talk to your pediatrician regarding the use of this medicine in children.  While this medicine may be used in children as young as 64 month of age for selected conditions, precautions do apply. Overdosage: If you think you have taken too much of this medicine contact a poison control center or emergency room at once. NOTE: This medicine is only for you. Do not share this medicine with others. What if I miss a dose? If you miss a dose, take it as soon as you can. If it is almost time for your next dose, take only that dose. Do not take double or extra  doses. What may interact with this medicine? Do not take this medicine with any of the following medications: -epoetin alfa This list may not describe all possible interactions. Give your health care provider a list of all the medicines, herbs, non-prescription drugs, or dietary supplements you use. Also tell them if you smoke, drink alcohol, or use illegal drugs. Some items may interact with your medicine. What should I watch for while using this medicine? Your condition will be monitored carefully while you are receiving this medicine. You may need blood work done while you are taking this medicine. This medicine may cause a decrease in vitamin B6. You should make sure that you get enough vitamin B6 while you are taking this medicine. Discuss the foods you eat and the vitamins you take with your health care professional. What side effects may I notice from receiving this medicine? Side effects that you should report to your doctor or health care professional as soon as possible: -allergic reactions like skin rash, itching or hives, swelling of the face, lips, or tongue -breathing problems -changes in vision -chest pain -confusion, trouble speaking or understanding -feeling faint or lightheaded, falls -high blood pressure -muscle aches or pains -pain, swelling, warmth in the leg -rapid weight gain -severe headaches -sudden numbness or weakness of the face, arm or leg -trouble walking, dizziness, loss of balance or coordination -seizures (convulsions) -swelling of the ankles, feet, hands -unusually weak or tired Side effects that usually do not require medical attention (report to your doctor or health care professional if they continue or are bothersome): -diarrhea -fever, chills (flu-like symptoms) -headaches -nausea, vomiting -redness, stinging, or swelling at site where injected This list may not describe all possible side effects. Call your doctor for medical advice about side  effects. You may report side effects to FDA at 1-800-FDA-1088. Where should I keep my medicine? Keep out of the reach of children. Store in a refrigerator between 2 and 8 degrees C (36 and 46 degrees F). Do not freeze. Do not shake. Throw away any unused portion if using a single-dose vial. Throw away any unused medicine after the expiration date. NOTE: This sheet is a summary. It may not cover all possible information. If you have questions about this medicine, talk to your doctor, pharmacist, or health care provider.  2019 Elsevier/Gold Standard (2017-03-27 16:44:20) Fulvestrant injection What is this medicine? FULVESTRANT (ful VES trant) blocks the effects of estrogen. It is used to treat breast cancer. This medicine may be used for other purposes; ask your health care provider or pharmacist if you have questions. COMMON BRAND NAME(S): FASLODEX What should I tell my health care provider before I take this medicine? They need to know if you have any of these conditions: -bleeding disorders -liver disease -low blood counts, like low white cell, platelet, or red cell counts -an unusual or allergic reaction to fulvestrant, other medicines, foods, dyes, or preservatives -pregnant or trying to get pregnant -  breast-feeding How should I use this medicine? This medicine is for injection into a muscle. It is usually given by a health care professional in a hospital or clinic setting. Talk to your pediatrician regarding the use of this medicine in children. Special care may be needed. Overdosage: If you think you have taken too much of this medicine contact a poison control center or emergency room at once. NOTE: This medicine is only for you. Do not share this medicine with others. What if I miss a dose? It is important not to miss your dose. Call your doctor or health care professional if you are unable to keep an appointment. What may interact with this medicine? -medicines that treat or  prevent blood clots like warfarin, enoxaparin, dalteparin, apixaban, dabigatran, and rivaroxaban This list may not describe all possible interactions. Give your health care provider a list of all the medicines, herbs, non-prescription drugs, or dietary supplements you use. Also tell them if you smoke, drink alcohol, or use illegal drugs. Some items may interact with your medicine. What should I watch for while using this medicine? Your condition will be monitored carefully while you are receiving this medicine. You will need important blood work done while you are taking this medicine. Do not become pregnant while taking this medicine or for at least 1 year after stopping it. Women of child-bearing potential will need to have a negative pregnancy test before starting this medicine. Women should inform their doctor if they wish to become pregnant or think they might be pregnant. There is a potential for serious side effects to an unborn child. Men should inform their doctors if they wish to father a child. This medicine may lower sperm counts. Talk to your health care professional or pharmacist for more information. Do not breast-feed an infant while taking this medicine or for 1 year after the last dose. What side effects may I notice from receiving this medicine? Side effects that you should report to your doctor or health care professional as soon as possible: -allergic reactions like skin rash, itching or hives, swelling of the face, lips, or tongue -feeling faint or lightheaded, falls -pain, tingling, numbness, or weakness in the legs -signs and symptoms of infection like fever or chills; cough; flu-like symptoms; sore throat -vaginal bleeding Side effects that usually do not require medical attention (report to your doctor or health care professional if they continue or are bothersome): -aches, pains -constipation -diarrhea -headache -hot flashes -nausea, vomiting -pain at site where  injected -stomach pain This list may not describe all possible side effects. Call your doctor for medical advice about side effects. You may report side effects to FDA at 1-800-FDA-1088. Where should I keep my medicine? This drug is given in a hospital or clinic and will not be stored at home. NOTE: This sheet is a summary. It may not cover all possible information. If you have questions about this medicine, talk to your doctor, pharmacist, or health care provider.  2019 Elsevier/Gold Standard (2017-06-20 11:34:41)

## 2018-07-01 ENCOUNTER — Other Ambulatory Visit: Payer: Self-pay | Admitting: Hematology

## 2018-07-01 ENCOUNTER — Encounter: Payer: Self-pay | Admitting: Critical Care Medicine

## 2018-07-01 ENCOUNTER — Ambulatory Visit: Payer: Medicare Other | Attending: Internal Medicine | Admitting: Critical Care Medicine

## 2018-07-01 ENCOUNTER — Telehealth: Payer: Self-pay

## 2018-07-01 ENCOUNTER — Telehealth: Payer: Self-pay | Admitting: *Deleted

## 2018-07-01 DIAGNOSIS — D649 Anemia, unspecified: Secondary | ICD-10-CM

## 2018-07-01 DIAGNOSIS — C7951 Secondary malignant neoplasm of bone: Secondary | ICD-10-CM | POA: Diagnosis not present

## 2018-07-01 DIAGNOSIS — D630 Anemia in neoplastic disease: Secondary | ICD-10-CM

## 2018-07-01 DIAGNOSIS — Z79899 Other long term (current) drug therapy: Secondary | ICD-10-CM

## 2018-07-01 DIAGNOSIS — I2699 Other pulmonary embolism without acute cor pulmonale: Secondary | ICD-10-CM

## 2018-07-01 DIAGNOSIS — IMO0001 Reserved for inherently not codable concepts without codable children: Secondary | ICD-10-CM

## 2018-07-01 DIAGNOSIS — D696 Thrombocytopenia, unspecified: Secondary | ICD-10-CM

## 2018-07-01 DIAGNOSIS — C50912 Malignant neoplasm of unspecified site of left female breast: Secondary | ICD-10-CM

## 2018-07-01 DIAGNOSIS — Z789 Other specified health status: Secondary | ICD-10-CM

## 2018-07-01 DIAGNOSIS — E440 Moderate protein-calorie malnutrition: Secondary | ICD-10-CM

## 2018-07-01 DIAGNOSIS — Z531 Procedure and treatment not carried out because of patient's decision for reasons of belief and group pressure: Secondary | ICD-10-CM

## 2018-07-01 DIAGNOSIS — Z515 Encounter for palliative care: Secondary | ICD-10-CM

## 2018-07-01 DIAGNOSIS — C50919 Malignant neoplasm of unspecified site of unspecified female breast: Secondary | ICD-10-CM

## 2018-07-01 DIAGNOSIS — J45909 Unspecified asthma, uncomplicated: Secondary | ICD-10-CM

## 2018-07-01 DIAGNOSIS — I824Y1 Acute embolism and thrombosis of unspecified deep veins of right proximal lower extremity: Secondary | ICD-10-CM

## 2018-07-01 MED ORDER — FOLIC ACID 1 MG PO TABS: 1.0000 mg | ORAL_TABLET | Freq: Every day | ORAL | 1 refills | Status: DC

## 2018-07-01 MED ORDER — CARVEDILOL 12.5 MG PO TABS: 12.5000 mg | ORAL_TABLET | Freq: Two times a day (BID) | ORAL | 3 refills | Status: DC

## 2018-07-01 MED ORDER — HYDROCODONE-HOMATROPINE 5-1.5 MG/5ML PO SYRP: 5.0000 mL | ORAL_SOLUTION | Freq: Three times a day (TID) | ORAL | 0 refills | Status: DC | PRN

## 2018-07-01 NOTE — Telephone Encounter (Signed)
-----   Message from [name], MD sent at 07/01/2018  9:12 AM EDT ----- Please let pt know her lab results, anemia improved, Calcium elevated again, she received Xgeva yesterday, which will correct her hypercalcemia, Cr slightly elevated due to hypercalcemia, encourage her drink more fluids, I will move her future appointments slightly, to match every 2 weeks lab and injection. Thanks   [name]  07/01/2018

## 2018-07-01 NOTE — Telephone Encounter (Signed)
Spoke with pt and informed her of lab results along with instructions as per Dr. Burr Medico.  Pt understood that she will be contacted with future appts.

## 2018-07-01 NOTE — Telephone Encounter (Signed)
Received a referral from Dr. Joya Gaskins for Palliative Care. Patient is being followed by palliative care. Phone call placed to patient. Due to the current COVID-19 infection/crises, the patient has given their verbal consent for, a provider visit via telemedicine. HIPPA policies of confidentially were discussed and patient expressed understanding. Visit scheduled for 07/02/2018 @

## 2018-07-02 ENCOUNTER — Other Ambulatory Visit: Payer: Medicare Other | Admitting: Adult Health Nurse Practitioner

## 2018-07-02 ENCOUNTER — Other Ambulatory Visit: Payer: Self-pay

## 2018-07-02 DIAGNOSIS — Z515 Encounter for palliative care: Secondary | ICD-10-CM

## 2018-07-02 NOTE — Progress Notes (Signed)
[facility] Consult Note Telephone: [phone]  Fax: [phone]  PATIENT NAME: Michaela Little DOB: Jun 03, 1960 MRN: [id number]  PRIMARY CARE PROVIDER:   Ladell Pier, MD  REFERRING PROVIDER: Dr. Ky Barban RESPONSIBLE PARTY:   Extended Emergency Contact Information Primary Emergency Contact: [place] Address: [address]          [place], [place] [address] [name] of [employer] Phone: [phone] Relation: Daughter Secondary Emergency Contact: Renaldo Reel, [place] [address] [place] Mobile Phone: [phone] Relation: Relative  Due to the COVID-19 crisis, this telephone evaluation and treatment contact was done via telephone and it was initiated and consent by this patient and or family.  RECOMMENDATIONS and PLAN:  1.  A-fib.  Patient returned home from rehab facility on 06/27/2018.  States that she is doing fine.  Is uncertain if she is supposed to have home health services.  Denies any chest pain, heart palpitations.  Does state having a cough but this has been going on since September and it has gotten worse.  States it is the same.    2.Anemia.  Patient states that she is weak but feels as if the PT/OT at rehab helped some. Is unable to tolerate oral iron supplementation. States it makes her nauseous with vomiting.  States having an infusion but unsure what it is called.  Also takes vitamin B12 supplementation. States that her last hemoglobin level was 6.3 which is better than her usual 3-4.  States that through [facility] [place] she gets an aid that helps her at home for 2 hours a day.  State that her PCP is working on getting her more hours with an aid in the home because she is too weak to take care of herself.  States that she lives alone.  Her daughter does come over to help out when she can but this has been less frequent due to the coronavirus. States that she was making a stew today and had to  take several breaks.  Stated that she wasn't able to stretch her oxygen tubing into the kitchen which made her get SOB. States that she took several breaks to use her oxygen. Would recommend portable oxygen but patient probably too weak to manage it.  3.  Goals of care.  Patient is full code.  Did not go over this today as patient was getting tired.  Did state that she would like [place] to check in with her via phone call every couple of weeks during this epidemic    I spent 20 minutes providing this consultation,  from 4:00 to 4:20. More than 50% of the time in this consultation was spent coordinating communication.   HISTORY OF PRESENT ILLNESS:  Michaela Little is a 58 y.o. year old female with multiple medical problems including breast cancerwith new mets to bone and liver,h/o DVT,obesity, HTN, HDL, OSA. Palliative Care was asked to help address goals of care.   CODE STATUS: Full code  PPS: 40% HOSPICE ELIGIBILITY/DIAGNOSIS: TBD  PAST MEDICAL HISTORY:  Past Medical History:  Diagnosis Date  . Anxiety   . Arthritis    hands, knees, hips  . Asthma   . Bronchitis   . Cancer ([place])   . Cervical stenosis (uterine cervix)   . Disorder of appendix    Enlarged  . Dyspnea on exertion   . GERD (gastroesophageal reflux disease)   . Headache(784.0)   .  History of breast cancer    2010--  LEFT  s/p  mastectomy (in [place]) AND CHEMORADIATION--  NO RECURRENCE  . History of cervical dysplasia   . Hyperlipidemia   . Hypertension   . Malignant neoplasm of overlapping sites of left breast in female, estrogen receptor positive ([place]) 03/05/2013  . OSA (obstructive sleep apnea) moderate osa per study  09/2010   CPAP  , NOT USING ON REGULAR BASIS  . Pelvic pain in female   . Positive H. pylori test    08-05-2013  . Positive TB test    [place] TEEN--  [place] W/ MEDS  . Refusal of blood transfusions as patient is Jehovah's Witness   . Seasonal allergies   . Uterine fibroid   . Wears glasses     SOCIAL HX:   Social History   Tobacco Use  . Smoking status: Former Smoker    Packs/day: 0.30    Years: 10.00    Pack years: 3.00    Types: Cigarettes    Last attempt to quit: 03/26/1988    Years since quitting: 30.2  . Smokeless tobacco: Never Used  Substance Use Topics  . Alcohol use: No    ALLERGIES:  Allergies  Allergen Reactions  . Lisinopril-Hydrochlorothiazide Itching  . Adhesive [Tape] Itching and Other (See Comments)    Redness  . Fentanyl Other (See Comments)    GI upset and drowsiness. Only to [facility]  . Gabapentin Other (See Comments)    Auditory hallucinations  . Hctz [Hydrochlorothiazide] Itching  . Latex Itching  . Lisinopril Itching  . Losartan Potassium Other (See Comments)    Makes her feel "bad"   . Other Other (See Comments)    Patient refuses blood for religious reasons (per her notes)  . Oxycodone     GI upset, drowsy  . Tums [Calcium Carbonate Antacid] Hives    FRUIT-FLAVORED ONES     PERTINENT MEDICATIONS:  Outpatient Encounter Medications as of 07/02/2018  Medication Sig  . acetaminophen (TYLENOL) 500 MG tablet Take 500 mg by mouth every 6 (six) hours as needed for mild pain.  . carvedilol (COREG) 12.5 MG tablet Take 1 tablet (12.5 mg total) by mouth 2 (two) times daily with a meal.  . cetirizine (ZYRTEC) 10 MG tablet Take 10 mg by mouth daily as needed for allergies or rhinitis.   Marland Kitchen docusate sodium (COLACE) 100 MG capsule Take 1 capsule (100 mg total) by mouth daily as needed for mild constipation. (Patient taking differently: Take 200 mg by mouth every other day. )  . enoxaparin (LOVENOX) 100 MG/ML injection 100 mg 2 (two) times daily.  . feeding supplement, ENSURE ENLIVE, (ENSURE ENLIVE) LIQD Take 237 mLs by mouth 2 (two) times daily between meals. (Patient taking differently: Take 237 mLs by mouth See admin instructions. Drink 237 ml's by mouth two to three times a day (Non-dairy Ensure))  . fluticasone (FLONASE) 50 MCG/ACT nasal spray Place 1 spray  into both nostrils daily.  . folic acid (FOLVITE) 1 MG tablet Take 1 tablet (1 mg total) by mouth daily.  . furosemide (LASIX) 20 MG tablet Take 20 mg by mouth daily as needed for fluid.  . hydrALAZINE (APRESOLINE) 10 MG tablet Take 1 tablet (10 mg total) by mouth 3 (three) times daily.  Marland Kitchen HYDROcodone-homatropine (HYCODAN) 5-1.5 MG/5ML syrup Take 5 mLs by mouth every 8 (eight) hours as needed for cough.  Marland Kitchen ipratropium-albuterol (DUONEB) 0.5-2.5 (3) MG/3ML SOLN INHALE THREE MLS VIA NEBULIZER EVERY 6 HOURS  AS NEEDED  . ondansetron (ZOFRAN) 4 MG tablet TAKE ONE TABLET BY MOUTH EVERY SIX HOURS AS NEEDED FOR NAUSEA  . polyethylene glycol (MIRALAX / GLYCOLAX) packet Take 17 g by mouth daily. (Patient taking differently: Take 17 g by mouth daily as needed for mild constipation. )  . traMADol (ULTRAM) 50 MG tablet Take 2 tablets (100 mg total) by mouth every 6 (six) hours. (Patient taking differently: Take 50 mg by mouth every 6 (six) hours as needed for moderate pain or severe pain. )   No facility-administered encounter medications on file as of 07/02/2018.       Michelle Vanhise Jenetta Downer, NP

## 2018-07-03 ENCOUNTER — Telehealth: Payer: Self-pay

## 2018-07-03 DIAGNOSIS — I5189 Other ill-defined heart diseases: Secondary | ICD-10-CM

## 2018-07-03 DIAGNOSIS — I2699 Other pulmonary embolism without acute cor pulmonale: Secondary | ICD-10-CM

## 2018-07-03 DIAGNOSIS — I519 Heart disease, unspecified: Secondary | ICD-10-CM

## 2018-07-03 DIAGNOSIS — C7951 Secondary malignant neoplasm of bone: Secondary | ICD-10-CM

## 2018-07-03 DIAGNOSIS — C50912 Malignant neoplasm of unspecified site of left female breast: Secondary | ICD-10-CM

## 2018-07-03 NOTE — Telephone Encounter (Signed)
Call placed to patient to discuss her needs at home. She explained that she has a personal care aid for 2 hrs x 7 day/week and she would like an increase in hours. Informed her that with provider approval, a request can be made for additional hours but there is no guarantee that she will be approved for the hours.    Also,provided her with the phone # for [facility] to place her name on wait list for services. If approved, that program can provide additional hours.    She noted that has not yet received home health services. Informed her that Dr Joya Gaskins will be informed.  Call was placed to [facility] , spoke to [place] who stated that the provider would need to request the services. She also noted that they do not provide any assistance with obtaining oral supplements.   The patient was interested in nutritional supplements. Informed her that her insurance will not cover oral supplements.  This was confirmed with Maudie Mercury, [facility].  Informed her that this CM can refer her to Ernestene Kiel, [place] w/ the [facility] who may be able to assist her with obtaining the supplements.  The patient was in agreement with the referral.    Instructed the patient to contact [employer] [facility] to inquire if they have longer O2 tubing that would allow her to navigate throughout her home using the concentrator in her living room.

## 2018-07-04 ENCOUNTER — Telehealth: Payer: Self-pay | Admitting: Hematology

## 2018-07-04 ENCOUNTER — Ambulatory Visit: Payer: Medicare Other | Admitting: Nutrition

## 2018-07-04 NOTE — Progress Notes (Signed)
Contacted patient to offer a complimentary case of Ensure Enlive. Patient would like to receive a case but needs to arrange for someone to pick it up from the cancer center. I gave patient my phone number to let me know when she is coming so I can have it ready for her. Provided office number.

## 2018-07-04 NOTE — Telephone Encounter (Signed)
R/s injection per 4/7 sch message - pt aware of appt date and time

## 2018-07-08 ENCOUNTER — Telehealth: Payer: Self-pay

## 2018-07-08 NOTE — Telephone Encounter (Signed)
Home health referral faxed to [facility]

## 2018-07-08 NOTE — Telephone Encounter (Signed)
Call placed to patient and informed her that the home health referral was sent to [facility].   Regarding the Ensure Alive donation from the [facility] dietitian, the patient said that her aide is not driving now and is not able to pick it up and she is not sure who else can assist her.  She said that if it is dropped off at her house, she would be able to get in inside.

## 2018-07-08 NOTE — Telephone Encounter (Signed)
PCS referral faxed to [facility].  Message received from Rance Muir, [place] stating that they received the referral for services and they are not able to accept the patient back.  She explained that they just discharged her  06/08/2018. Margarita Grizzle stated that her needs are beyond what home health can provide. Her rehab potential is poor and she was non compliant with the plan of care.

## 2018-07-09 NOTE — Telephone Encounter (Signed)
Call placed to patient and informed her that a staff member will drop off the ensure at her house this morning.  She said that her aide will be with her at that time and will bring it in the house.    This CM also informed patient that [facility] will not be able to accept her back for services.  She said that she understood and then stated that a nurse is coming to see her today to do an assessment but she was not sure where the nurse is coming from.  She said she will find that information out when the nurse arrives and let this CM know.

## 2018-07-10 ENCOUNTER — Telehealth: Payer: Self-pay | Admitting: Internal Medicine

## 2018-07-10 ENCOUNTER — Other Ambulatory Visit: Payer: Medicare Other

## 2018-07-10 ENCOUNTER — Ambulatory Visit: Payer: Medicare Other

## 2018-07-10 ENCOUNTER — Ambulatory Visit: Payer: Medicare Other | Admitting: Hematology

## 2018-07-10 DIAGNOSIS — N949 Unspecified condition associated with female genital organs and menstrual cycle: Secondary | ICD-10-CM

## 2018-07-10 NOTE — Progress Notes (Signed)
[place]   [phone] Fax:([phone]) [phone]   Clinic Follow up Note   Patient Care Team: Ladell Pier, MD as PCP - General (Internal Medicine) [name], MD as Consulting Physician (Pulmonary Disease) [name], MD as Consulting Physician (Hematology) Clent Demark, PA-C as Physician Assistant (Physician Assistant) Berneta Sages, NP as Nurse Practitioner (Adult Health Nurse Practitioner)  Date of Service:  07/14/2018  CHIEF COMPLAINT:  F/u of metastatic breast cancer and anemia  SUMMARY OF ONCOLOGIC HISTORY: Oncology History   Cancer Staging Malignant neoplasm of overlapping sites of left breast in female, estrogen receptor positive ([place]) Staging form: Breast, AJCC 7th Edition - Clinical: Stage IIIA (T2, N2, M0) - Unsigned       Malignant neoplasm of overlapping sites of left breast in female, estrogen receptor positive ([place])   05/2008 Cancer Diagnosis    Left sided breast cancer (no path report available)    11/2008 Pathologic Stage    Stage IIIA: T2 N2     Neo-Adjuvant Chemotherapy    Paclitaxel weekly x 12; doxorubicin and cyclophosphamide x 4 - both given with trifiparnib (treated at [facility] in [place])    11/2008 Definitive Surgery    Left modified radiation mastectomy: invasive lobular carcinoma, ER+, PR+, HER2/neu negative, 5/22 LN positive     - 03/2009 Radiation Therapy    Adjuvant radiation to left chest wall    2011 - 08/2014 Anti-estrogen oral therapy    Tamoxifen 20 mg used briefly; discontinued due to uterine lining concerns; changed to anastrozole 1 mg daily (began 07/2100); discontinued by patient summer of 2016    05/26/2015 Progression    Iliac bone biopsy showed metastatic carcinoma consistent with a breast primary, ER positive, PR and HER-2 negative. Her staging CT, and a PET scan was negative for visceral metastasis.    05/2015 - 01/2018 Anti-estrogen oral therapy    1. letrozole and Ibrance started March  2017             -Ibrance dose decreased to 75 mg daily. Stopped in 11/2017 due to anemia   -Letrozole switched to Exemestane on 02/13/17 due to b/l rib pain. She exemestane herself in 01/2018.      11/16/2015 Miscellaneous    Xgeva monthly for bone metastasis     07/03/2016 Imaging    CT chest, abdomen and pelvis with contrast showed no evidence of metastasis.     02/07/2017 Imaging    CT AP W Contrast 02/07/17 IMPRESSION: 1. No CT findings for abdominal/pelvic metastatic disease. 2. No acute abdominal findings, mass lesions or adenopathy. 3. Status post cholecystectomy with mild associated common bile duct dilatation. 4. Somewhat thickened appendix appears relatively stable. No acute inflammatory process. 5. Stable mixed lytic and sclerotic osseous metastatic disease.     02/07/2017 Imaging    Bone Scan Whole Body 02/07/17 IMPRESSION: No scintigraphic evidence skeletal metastasis     08/12/2017 Imaging    Whole Boday Scan 08/12/17 IMPRESSION: Scattered degenerative type uptake as above with a questionable focus of increased tracer localization at L2 vertebral body versus artifact; no abnormality is seen at this site by CT. Consider either characterization by MR or attention on follow-up imaging.    08/12/2017 Imaging    CT AP W Contrast 08/12/17  IMPRESSION: Continued thickening of the appendix is noted without surrounding inflammation. It has maximum measured diameter is decreased compared to prior exam. There is no evidence of acute inflammation. Stable mixed lytic and sclerotic appearance  of visualized skeleton is noted consistent with history of known osseous metastases. No acute abnormality seen in the abdomen or pelvis.    08/22/2017 Surgery    APPENDECTOMY LAPAROSCOPIC ERAS PATHWAY by Dr. Malcolm Metro and REMOVAL OF LEFT TISSUE EXPANDER by Dr. Harlow Mares 08/22/17    08/22/2017 Pathology Results    Diagnosis 08/22/17  1. Appendix, Other than Incidental - METASTATIC  CARCINOMA, CONSISTENT WITH BREAST PRIMARY. - CARCINOMA IS PRESENT AT THE SURGICAL RESECTION MARGIN AS WELL AS THE SEROSAL SURFACE. - LYMPHOVASCULAR INVASION IS IDENTIFIED. - SEE COMMENT. 2. Breast, capsule, Left - DENSE FIBROUS TISSUE WITH MILD INFLAMMATION, INCLUDING A FEW SCATTERED MULTINUCLEATED GIANT CELLS. - THERE IS NO EVIDENCE OF MALIGNANCY.    08/27/2017 Imaging    CT AP W Contrast 08/27/17 IMPRESSION: No evidence of pulmonary embolus. Small pericardial effusion with uncertain clinical significance. No evidence of acute abnormalities within the chest, abdomen or pelvis. Area of fat stranding and small focus of gas within the anterior abdominal wall, immediately inferior to the umbilicus, probably at the site of laparoscopic access.    10/07/2017 Echocardiogram    10/07/2017 ECO Study Conclusions  - Procedure narrative: Transthoracic echocardiography. Image   quality was adequate. The study was technically difficult, as a   result of poor acoustic windows. - Left ventricle: The cavity size was normal. Wall thickness was   normal. Systolic function was normal. The estimated ejection   fraction was in the range of 60% to 65%. Wall motion was normal;   there were no regional wall motion abnormalities. Features are   consistent with a pseudonormal left ventricular filling pattern,   with concomitant abnormal relaxation and increased filling   pressure (grade 2 diastolic dysfunction).    01/16/2018 PET scan    01/16/2018 PET Scan IMPRESSION: 1. Most striking finding is intense marrow activity diffusely throughout the axillary and appendicular skeleton. This is favored a physiologic marrow response from chronic anemia rather than diffuse metastatic disease. 2. Several discrete foci of more intense activity noted in the spine which could indicate malignancy or trauma. Difficult to define malignancy on the background of diffuse marrow activity. 3. Diffuse sclerosis throughout  the bones similar to a recent CT scans but new from remote PET-CT scan 03/01/2016. 4. No evidence soft tissue metastasis.     04/02/2018 Imaging    CT Angio CAP 04/02/18  IMPRESSION: 1. Multiple new hepatic lesions highly concerning for developing metastatic disease to the liver. This could be confirmed with nonemergent MRI of the abdomen with and without IV gadolinium if clinically appropriate. 2. Widespread metastatic disease to the bones also appears progressive compared to prior examinations. 3. Large right-sided pelvic deep venous thrombosis extending from the right common femoral vein to the proximal right external iliac vein. IVC filter is in position. No clot identified in the IVC filter at this time. No pulmonary embolism on today's examination. 4. Aortic atherosclerosis. 5. Additional incidental findings, as above.    05/01/2018 -  Anti-estrogen oral therapy    Fulvestrant injections '500mg'$  monthly starting 05/01/18    06/30/2018 -  Antibody Plan    Monthly Xgeva restarted 06/30/18       CURRENT THERAPY:  -Aranesp injection every 2 weeks -B12 injectionsmonthly  -Lovenox '120mg'$  q12h, changed to '100mg'$  every 12 hours in early Feb 2020 at home (adjusted based on her weight) -Monthlyfulvestrant injection'500mg'$  starting on 2/8/20with loading dose for first month. Monthly starting 06/27/18.  -Monthly Xgeva restarted 06/30/18  INTERVAL HISTORY:  Michaela Little is here  for a follow up and injection. She presents to the clinic today by herself. She notes she is not feeling well. She has been having nausea and upset stomach since she was in rehab. She notes having skin growths under her left breast and in her vaginal area. These are not painful to her. She denies any discharge from them. She was given bactrim in rehab for this. This has been present for 2 weeks and has progressed.  She notes she is alone at home but has nurse aid come for 2 hours 7 days a week. She has swelling in her knees  and uses a walker to ambulate. She notes her temp was 99.6 yesterday. She denies having chills.    REVIEW OF SYSTEMS:   Constitutional: Denies fevers, chills or abnormal weight loss Eyes: Denies blurriness of vision Ears, nose, mouth, throat, and face: Denies mucositis or sore throat Respiratory: Denies cough, dyspnea or wheezes Cardiovascular: Denies palpitation, chest discomfort or lower extremity swelling Gastrointestinal:  Denies nausea, heartburn or change in bowel habits (+) enteral hemorrhoids, non-bleeding  Skin: Denies abnormal skin rashes (+) nodularity of labia and under right breast and back  MSK: (+) Swelling in b/l knees  Lymphatics: Denies new lymphadenopathy or easy bruising Neurological:Denies numbness, tingling or new weaknesses Behavioral/Psych: Mood is stable, no new changes  All other systems were reviewed with the patient and are negative.  MEDICAL HISTORY:  Past Medical History:  Diagnosis Date  . Anxiety   . Arthritis    hands, knees, hips  . Asthma   . Bronchitis   . Cancer ([place])   . Cervical stenosis (uterine cervix)   . Disorder of appendix    Enlarged  . Dyspnea on exertion   . GERD (gastroesophageal reflux disease)   . Headache(784.0)   . History of breast cancer    2010--  LEFT  s/p  mastectomy (in [place]) AND CHEMORADIATION--  NO RECURRENCE  . History of cervical dysplasia   . Hyperlipidemia   . Hypertension   . Malignant neoplasm of overlapping sites of left breast in female, estrogen receptor positive ([place]) 03/05/2013  . OSA (obstructive sleep apnea) moderate osa per study  09/2010   CPAP  , NOT USING ON REGULAR BASIS  . Pelvic pain in female   . Positive H. pylori test    08-05-2013  . Positive TB test    [place] TE[place]--  [place]/ MEDS  . Refusal of blood transfusions as patient is Jehovah's Witness   . Seasonal allergies   . Uterine fibroid   . Wears glasses     SURGICAL HISTORY: Past Surgical History:  Procedure Laterality Date  .  APPENDECTOMY  y  . CARDIOVASCULAR STRESS TEST  10-29-2012   low risk perfusion study/  no significant reversibity/ ef 66%/  normal wall motion  . CERVICAL CONIZATION W/BX  2012   in  [place] N/A 05/14/2012   Procedure: LAPAROSCOPIC CHOLECYSTECTOMY;  Surgeon: Ralene Ok, MD;  Location: [facility];  Service: General;  Laterality: N/A;  . COLONOSCOPY  2011   normal per patient - [place]  . DILATION AND CURETTAGE OF UTERUS  10/15/2011   Procedure: DILATATION AND CURETTAGE;  Surgeon: Melina Schools, MD;  Location: [facility];  Service: Gynecology;  Laterality: N/A;  Conization &  endocervical curettings  . EXAMINATION UNDER ANESTHESIA N/A 08/20/2013   Procedure: EXAM UNDER ANESTHESIA;  Surgeon: Margarette Asal, MD;  Location: [facility];  Service: Gynecology;  Laterality:  N/A;  . [place]   right  . IR IVC FILTER PLMT / S&I /IMG GUID/MOD SED  03/01/2018  . IR RADIOLOGIST EVAL & MGMT  04/08/2018  . LAPAROSCOPIC APPENDECTOMY N/A 08/22/2017   Procedure: [place];  Surgeon: Coralie Keens, MD;  Location: [facility];  Service: General;  Laterality: N/A;  . LAPAROSCOPIC ASSISTED VAGINAL HYSTERECTOMY N/A 10/26/2014   Procedure: HYSTERECTOMY ABDOMINAL ;  Surgeon: Molli Posey, MD;  Location: [facility];  Service: Gynecology;  Laterality: N/A;  . MASTECTOMY Left 11/2008  in [place]   . SALPINGOOPHORECTOMY Bilateral 10/26/2014   Procedure: BILATERAL SALPINGO OOPHORECTOMY;  Surgeon: Molli Posey, MD;  Location: [facility];  Service: Gynecology;  Laterality: Bilateral;  . TISSUE EXPANDER PLACEMENT Left 08/22/2017   Procedure: REMOVAL OF LEFT TISSUE EXPANDER;  Surgeon: Crissie Reese, MD;  Location: [facility];  Service: Plastics;  Laterality: Left;  . TISSUE EXPANDER REMOVAL Left 08/22/2017  . TRANSTHORACIC ECHOCARDIOGRAM  10-29-2012   mild lvh/  ef 60-65%/  grade II diastolic dysfunction/  trivial mr  &  tr    I have reviewed the social history  and family history with the patient and they are unchanged from previous note.  ALLERGIES:  is allergic to lisinopril-hydrochlorothiazide; adhesive [tape]; fentanyl; gabapentin; hctz [hydrochlorothiazide]; latex; lisinopril; losartan potassium; other; oxycodone; and tums [calcium carbonate antacid].  MEDICATIONS:  Current Outpatient Medications  Medication Sig Dispense Refill  . acetaminophen (TYLENOL) 500 MG tablet Take 500 mg by mouth every 6 (six) hours as needed for mild pain.    . carvedilol (COREG) 12.5 MG tablet Take 1 tablet (12.5 mg total) by mouth 2 (two) times daily with a meal. 60 tablet 3  . cetirizine (ZYRTEC) 10 MG tablet Take 10 mg by mouth daily as needed for allergies or rhinitis.   11  . docusate sodium (COLACE) 100 MG capsule Take 1 capsule (100 mg total) by mouth daily as needed for mild constipation. (Patient taking differently: Take 200 mg by mouth every other day. ) 10 capsule 0  . enoxaparin (LOVENOX) 100 MG/ML injection 100 mg 2 (two) times daily.    . feeding supplement, ENSURE ENLIVE, (ENSURE ENLIVE) LIQD Take 237 mLs by mouth 2 (two) times daily between meals. (Patient taking differently: Take 237 mLs by mouth See admin instructions. Drink 237 ml's by mouth two to three times a day (Non-dairy Ensure)) 237 mL 12  . fluticasone (FLONASE) 50 MCG/ACT nasal spray Place 1 spray into both nostrils daily.  2  . hydrALAZINE (APRESOLINE) 10 MG tablet Take 1 tablet (10 mg total) by mouth 3 (three) times daily. 90 tablet 0  . HYDROcodone-homatropine (HYCODAN) 5-1.5 MG/5ML syrup Take 5 mLs by mouth every 8 (eight) hours as needed for cough. 120 mL 0  . ipratropium-albuterol (DUONEB) 0.5-2.5 (3) MG/3ML SOLN INHALE THREE MLS VIA NEBULIZER EVERY 6 HOURS AS NEEDED 360 mL 5  . ondansetron (ZOFRAN) 4 MG tablet TAKE ONE TABLET BY MOUTH EVERY SIX HOURS AS NEEDED FOR NAUSEA    . polyethylene glycol (MIRALAX / GLYCOLAX) packet Take 17 g by mouth daily. (Patient taking differently: Take 17 g  by mouth daily as needed for mild constipation. ) 14 each 0  . traMADol (ULTRAM) 50 MG tablet Take 2 tablets (100 mg total) by mouth every 6 (six) hours. (Patient taking differently: Take 50 mg by mouth every 6 (six) hours as needed for moderate pain or severe pain. ) 30 tablet 0  .  furosemide (LASIX) 20 MG tablet Take 20 mg by mouth daily as needed for fluid.     No current facility-administered medications for this visit.     PHYSICAL EXAMINATION: ECOG PERFORMANCE STATUS: 3 - Symptomatic, >50% confined to bed  Vitals:   07/14/18 1419  BP: 140/78  Pulse: 87  Resp: 17  Temp: 98.7 F (37.1 C)  SpO2: 100%   Filed Weights   07/14/18 1419  Weight: 214 lb 3.2 oz (97.2 kg)    GENERAL:alert, no distress and comfortable SKIN: skin color, texture, turgor are normal (+) small  0.5-1cm cutaneous nodules with skin hyperpigmentation in her mid back, under right breast and outside vaginal area  EYES: normal, Conjunctiva are pink and non-injected, sclera clear OROPHARYNX:no exudate, no erythema and lips, buccal mucosa, and tongue normal  NECK: supple, thyroid normal size, non-tender, without nodularity LYMPH:  no palpable lymphadenopathy in the cervical, axillary or inguinal LUNGS: clear to auscultation and percussion with normal breathing effort HEART: regular rate & rhythm and no murmurs and no lower extremity edema ABDOMEN:abdomen soft, non-tender and normal bowel sounds Musculoskeletal:no cyanosis of digits and no clubbing  NEURO: alert & oriented x 3 with fluent speech, no focal motor/sensory deficits RECTAL: (+) External hemorrhoids   LABORATORY DATA:  I have reviewed the data as listed CBC Latest Ref Rng & Units 07/14/2018 06/30/2018 06/13/2018  WBC 4.0 - 10.5 K/uL 4.0 3.7(L) 3.7(L)  Hemoglobin 12.0 - 15.0 g/dL 6.0(LL) 6.3(LL) 4.5(LL)  Hematocrit 36.0 - 46.0 % 20.6(L) 22.1(L) 16.0(L)  Platelets 150 - 400 K/uL 124(L) 125(L) 136(L)     CMP Latest Ref Rng & Units 06/30/2018 06/13/2018  06/02/2018  Glucose 70 - 99 mg/dL 124(H) 107(H) 96  BUN 6 - 20 mg/dL _0 Creatinine 0.44 - 1.00 mg/dL 1.13(H) 0.97 1.07(H)  Sodium 135 - 145 mmol/L 137 139 138  Potassium 3.5 - 5.1 mmol/L 3.8 4.0 3.5  Chloride 98 - 111 mmol/L 105 106 103  CO2 22 - 32 mmol/L 21(L) 22 25  Calcium 8.9 - 10.3 mg/dL 12.5(H) 9.6 13.0(H)  Total Protein 6.5 - 8.1 g/dL 7.6 6.9 -  Total Bilirubin 0.3 - 1.2 mg/dL 1.2 1.0 -  Alkaline Phos 38 - 126 U/L 213(H) 174(H) -  AST 15 - 41 U/L 54(H) 33 -  ALT 0 - 44 U/L 21 15 -      RADIOGRAPHIC STUDIES: I have personally reviewed the radiological images as listed and agreed with the findings in the report. No results found.   ASSESSMENT & PLAN:  Michaela Little is a 58 y.o. female with   1. 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 -Unfortunately she developed bone metastasis 1 year after she stoppinganastrozole.She was treated withAI and Ibrance.Ibrance wasstopped in 11/2017 due to severe anemia. She also stoppedexemestane in late November 2019 herself.  -Wepreviouslydiscussed her CT CAP from 04/02/18 which shows disease progression in bone and metastasis to liver. -Given her FOgenomic test resultsshowPIK3CA mutation,I recommendedAlpelisib (Piqray), along with fulvestrant injection, however she is very concerned about side effects especially risk of anemia from [place] has declined. I again encouraged her to consider due to probable cancer progression but she declined again  -She startedFulvestrantinjection on 05/01/18. Tolerating well.  -Monthly Xgeva injection has been held since September 2019when she developed severe anemia. Has been more stable lately, restarted Xgeva on 06/30/18.  -She has  new subcutaneous nodularity of her labia, and in her trunk. It is possible  her cancer can spread to the skin or reactive. We discussed biopsy or repeating scan. She is agreeable with repeating PET scan  -She is clinically doing poorly. Her body pain and cough have improved some lately, but she is very fatigued, with low appetite and has lost 10 pounds in the last few week, concerning for cancer progression.  -CBC reviewed, Hg at 6, PLT 124K. Overall adequate to proceed with fulvestrant, Arenesp and B12.  -She feels her nodules are related to fulvestrant wants to hold it. I will hold it today to see if her skin nodules resolve. I have low suspicious this is related to fulvestrant.  -F/u in 2 weeks with PET a few days before   2.Bone metastasis, hypercalcemiasecondary to her malignancy -She was previously on Xgeva, stopped when she developed a severe anemia. She received a Zometa on 04/15/2018 for severe hypercalcemia and again on 06/03/18.  -Restarted monthly Xgeva on 06/30/18. Next injection in 2 weeks  -Continue to monitorrenal function andhypercalcemia.  3.Severe anemia  -She developed worsening anemia in September 2019, lowest hemoglobin 3.9, likely secondary to [place] and diffuse bone metastasis  -She is a Jehovah witness, refuses blood transfusion -She has been on Aranesp injection since then, anemia slowly improving. Will continue '500mg'$  injections every 2 weeks, will stop if Hb above 7-8.  -will continue B12 injection monthly -She has not been able to tolerate oral iron due to constipation and nausea so I d/c. She was previously prescribed lactulose for constipation.  -HG at 6 today (07/14/18). Proceed with Aranesp and B12 injections today   4. Right LE DVT, Recent PE -She was advised to restarted Lovenox '80mg'$ , but stopped on her own due to the concerns of bleeding -She was hospitalized on 02/27/18 for multiple PE. This is likely related to metastatic cancer, immobility and Aranesp injections.Patient wishes to continue Aranesp injection despite the risk  of thrombosis. Nunzio Cory repeatedlydeclined thrombectomy and Angovac which was offeredby IR Dr. Geroge Baseman  -Her ankle swelling hadsignificantly decreased but still has swelling in her right knee and upper leg. Stable.  -Due to her recent weight loss.She is currently on Lovenox 100 mg every 12 hours at home, continue.  5. Cough and Dyspnea -f/u with pulmonologist Dr. Melvyn Novas -on Flovent,Cough improved with Hycodan, continue  -05/2018 CT Chest show normal lung. -Not currently on Continuous O2 canula, 2L -Cough has much improved, likely resolved now  6. Body pain -She has been having bilateral rib cage pain from her aggressive coughing and probably related to her bone metastasis -Take tramadol as needed once a day. She does not feel she needsstrongerpain medication.  -She feels she may have another rib fracture from her aggressive coughing. Will monitor. Improving.   7. Afib -Since last visit she was hospitalized on 05/30/18 for atrial fibrillation  -She was treated with Cardizem and discharged with coreg. Continue  -She was recently in nursing home rehab.   8. Goal of care discussion -We previously discussed the incurable nature of her cancer, and the overall poor prognosis, especially if she does not have good response to chemotherapy or progress on chemo -The patient understands the goal of care is palliative. -I recommend DNR/DNI,wepreviouslyhad long conversation about her goal of care, her friends also encouraged her to consider DNR,she has not agreed with it.   9. skin nodularity of labia, back and under right breast  -started about 2-3 weeks ago, possible metastasis from her  breast cancer, folliculitis or reactive. -Pt believes it is related to Fulvestrant and wants to hold it today.     Plan -Labs reviewed, Will proceed with Aranesp and B12 injection, hold Fulvestrant injection today due to her new skin lesions   -Continue 1107m Lovenox injections BID at home  -Lab, f/u and Xgeva and Aranesp injections in 2 weeks  -PET in 1-2 weeks    No problem-specific Assessment & Plan notes found for this encounter.   Orders Placed This Encounter  Procedures  . NM PET Image Restag (PS) Skull Base To Thigh    Standing Status:   Future    Standing Expiration Date:   07/14/2019    Order Specific Question:   If indicated for the ordered procedure, I authorize the administration of a radiopharmaceutical per Radiology protocol    Answer:   Yes    Order Specific Question:   Is the patient pregnant?    Answer:   No    Order Specific Question:   Preferred imaging location?    [facility]   [facility]   Order Specific Question:   Radiology Contrast Protocol - do NOT remove file path    Answer:   [email or web address]   All questions were answered. The patient knows to call the clinic with any problems, questions or concerns. No barriers to learning was detected. I spent 25 minutes counseling the patient face to face. The total time spent in the appointment was 30 minutes and more than 50% was on counseling and review of test results     YTruitt Merle MD 07/14/2018   I, AJoslyn Devon am acting as scribe for YTruitt Merle MD.   I have reviewed the above documentation for accuracy and completeness, and I agree with the above.

## 2018-07-10 NOTE — Telephone Encounter (Signed)
Patients call returned.  Patient identified by name and date of birth.  Patient states she has growths and swelling in her vaginal area. Patient states she has intermittent sharp pain since her hospitalization in early March.  Patient denies any radiation of the pain.  Pain level when it is present is "above 10"    Patient advised that PCP would be informed and she would be advised accordingly.  Patient acknowledged understanding of advise.

## 2018-07-10 NOTE — Telephone Encounter (Signed)
New Message   Pt states she is having some growths in her vaginal area with swelling

## 2018-07-10 NOTE — Telephone Encounter (Signed)
April called for verbal orders for skilled nursing for 1 week 4 and home health aide for 2 a week for 2 States that the patient has to raised areas near her groin and that are swelled but not drained. Please follow up. Please follow up.

## 2018-07-10 NOTE — Telephone Encounter (Signed)
Returned Michaela Little from [facility] to give verbal orders   Per Michaela Little pt has 2 boils on each side of groin area that is not open. She is wanting to know what advice to give pt, but in the time being Michaela Little informed pt to do hot compress

## 2018-07-11 ENCOUNTER — Other Ambulatory Visit: Payer: Medicare Other

## 2018-07-11 ENCOUNTER — Telehealth: Payer: Self-pay | Admitting: Internal Medicine

## 2018-07-11 ENCOUNTER — Ambulatory Visit: Payer: Medicare Other

## 2018-07-11 ENCOUNTER — Ambulatory Visit: Payer: Medicare Other | Admitting: Hematology

## 2018-07-11 NOTE — Addendum Note (Signed)
Addended by: Karle Plumber B on: 07/11/2018 06:49 PM   Modules accepted: Orders

## 2018-07-11 NOTE — Telephone Encounter (Addendum)
Dr. Wynetta Emery is not in the office next week.  Pt has a f/u televisit appointment with Dr. Joya Gaskins on next Tuesday at 3p. Scheduled an appointment for patient to come in the office on Tuesday at 10:50.   Pt c/o intermittent sharp pain in the vaginal area contributing from "growths and swelling." Denies blisters or discharge from the area.  Advised patient to sit in a basin of tolerable warm salt water.  Recommended that patient come in office for OV in person for evaluation.

## 2018-07-11 NOTE — Telephone Encounter (Signed)
Patients call returned.  Patient identified by name and date of birth.  Patient informed of Dr. Wynetta Emery advice.  Patient states she doesn't believe that the "growths are boils.  She is requesting to see a GYN specialist.  Patient advised that Dr. Wynetta Emery would be informed.  Patient acknowledged understanding of advise.

## 2018-07-11 NOTE — Telephone Encounter (Signed)
Nurse called the patient's home phone number but received no answer and message was left on the voicemail for the patient to call back.  Return phone number given. 

## 2018-07-11 NOTE — Telephone Encounter (Signed)
malarie called for verbal orders for home health  OT  1 week 1  2 week 4  Please follow up.

## 2018-07-11 NOTE — Telephone Encounter (Signed)
Returned [name] from [place] at [employer] and gave verbal orders

## 2018-07-13 NOTE — Telephone Encounter (Signed)
agreed

## 2018-07-14 ENCOUNTER — Inpatient Hospital Stay: Payer: Medicare Other

## 2018-07-14 ENCOUNTER — Inpatient Hospital Stay (HOSPITAL_BASED_OUTPATIENT_CLINIC_OR_DEPARTMENT_OTHER): Payer: Medicare Other | Admitting: Hematology

## 2018-07-14 ENCOUNTER — Other Ambulatory Visit: Payer: Self-pay

## 2018-07-14 ENCOUNTER — Encounter: Payer: Self-pay | Admitting: Hematology

## 2018-07-14 VITALS — BP 140/78 | HR 87 | Temp 98.7°F | Resp 17 | Ht 67.0 in | Wt 214.2 lb

## 2018-07-14 DIAGNOSIS — C785 Secondary malignant neoplasm of large intestine and rectum: Secondary | ICD-10-CM

## 2018-07-14 DIAGNOSIS — C50912 Malignant neoplasm of unspecified site of left female breast: Secondary | ICD-10-CM

## 2018-07-14 DIAGNOSIS — I4891 Unspecified atrial fibrillation: Secondary | ICD-10-CM

## 2018-07-14 DIAGNOSIS — I82402 Acute embolism and thrombosis of unspecified deep veins of left lower extremity: Secondary | ICD-10-CM

## 2018-07-14 DIAGNOSIS — Z170 Estrogen receptor positive status [ER+]: Secondary | ICD-10-CM | POA: Diagnosis not present

## 2018-07-14 DIAGNOSIS — R229 Localized swelling, mass and lump, unspecified: Secondary | ICD-10-CM

## 2018-07-14 DIAGNOSIS — C50812 Malignant neoplasm of overlapping sites of left female breast: Secondary | ICD-10-CM

## 2018-07-14 DIAGNOSIS — C7951 Secondary malignant neoplasm of bone: Secondary | ICD-10-CM | POA: Diagnosis not present

## 2018-07-14 DIAGNOSIS — R05 Cough: Secondary | ICD-10-CM

## 2018-07-14 DIAGNOSIS — I2699 Other pulmonary embolism without acute cor pulmonale: Secondary | ICD-10-CM

## 2018-07-14 DIAGNOSIS — R0600 Dyspnea, unspecified: Secondary | ICD-10-CM

## 2018-07-14 DIAGNOSIS — Z5111 Encounter for antineoplastic chemotherapy: Secondary | ICD-10-CM | POA: Diagnosis not present

## 2018-07-14 DIAGNOSIS — Z7189 Other specified counseling: Secondary | ICD-10-CM

## 2018-07-14 DIAGNOSIS — R222 Localized swelling, mass and lump, trunk: Secondary | ICD-10-CM

## 2018-07-14 DIAGNOSIS — C787 Secondary malignant neoplasm of liver and intrahepatic bile duct: Secondary | ICD-10-CM

## 2018-07-14 DIAGNOSIS — D649 Anemia, unspecified: Secondary | ICD-10-CM

## 2018-07-14 DIAGNOSIS — R634 Abnormal weight loss: Secondary | ICD-10-CM

## 2018-07-14 LAB — CBC WITH DIFFERENTIAL/PLATELET
Abs Immature Granulocytes: 0.07 10*3/uL (ref 0.00–0.07)
Basophils Absolute: 0 10*3/uL (ref 0.0–0.1)
Basophils Relative: 1 %
Eosinophils Absolute: 0.2 10*3/uL (ref 0.0–0.5)
Eosinophils Relative: 4 %
HCT: 20.6 % — ABNORMAL LOW (ref 36.0–46.0)
Hemoglobin: 6 g/dL — CL (ref 12.0–15.0)
Immature Granulocytes: 2 %
Lymphocytes Relative: 26 %
Lymphs Abs: 1 10*3/uL (ref 0.7–4.0)
MCH: 28.3 pg (ref 26.0–34.0)
MCHC: 29.1 g/dL — ABNORMAL LOW (ref 30.0–36.0)
MCV: 97.2 fL (ref 80.0–100.0)
Monocytes Absolute: 0.4 10*3/uL (ref 0.1–1.0)
Monocytes Relative: 11 %
Neutro Abs: 2.2 10*3/uL (ref 1.7–7.7)
Neutrophils Relative %: 56 %
Platelets: 124 10*3/uL — ABNORMAL LOW (ref 150–400)
RBC: 2.12 MIL/uL — ABNORMAL LOW (ref 3.87–5.11)
RDW: 18.5 % — ABNORMAL HIGH (ref 11.5–15.5)
WBC: 4 10*3/uL (ref 4.0–10.5)
nRBC: 5 % — ABNORMAL HIGH (ref 0.0–0.2)

## 2018-07-14 MED ORDER — FULVESTRANT 250 MG/5ML IM SOLN
INTRAMUSCULAR | Status: AC
  Filled 2018-07-14: qty 10

## 2018-07-14 MED ORDER — CYANOCOBALAMIN 1000 MCG/ML IJ SOLN
1000.0000 ug | Freq: Once | INTRAMUSCULAR | Status: AC
  Administered 2018-07-14: 1000 ug via INTRAMUSCULAR

## 2018-07-14 MED ORDER — DARBEPOETIN ALFA 500 MCG/ML IJ SOSY
500.0000 ug | PREFILLED_SYRINGE | Freq: Once | INTRAMUSCULAR | Status: AC
  Administered 2018-07-14: 500 ug via SUBCUTANEOUS

## 2018-07-14 MED ORDER — DARBEPOETIN ALFA 500 MCG/ML IJ SOSY
PREFILLED_SYRINGE | INTRAMUSCULAR | Status: AC
  Filled 2018-07-14: qty 1

## 2018-07-14 NOTE — Patient Instructions (Signed)
Cyanocobalamin, Vitamin B12 injection What is this medicine? CYANOCOBALAMIN (sye an oh koe BAL a min) is a man made form of vitamin B12. Vitamin B12 is used in the growth of healthy blood cells, nerve cells, and proteins in the body. It also helps with the metabolism of fats and carbohydrates. This medicine is used to treat people who can not absorb vitamin B12. This medicine may be used for other purposes; ask your health care provider or pharmacist if you have questions. COMMON BRAND NAME(S): B-12 Compliance Kit, B-12 Injection Kit, Cyomin, LA-12, Nutri-Twelve, Physicians EZ Use B-12, Primabalt What should I tell my health care provider before I take this medicine? They need to know if you have any of these conditions: -kidney disease -Leber's disease -megaloblastic anemia -an unusual or allergic reaction to cyanocobalamin, cobalt, other medicines, foods, dyes, or preservatives -pregnant or trying to get pregnant -breast-feeding How should I use this medicine? This medicine is injected into a muscle or deeply under the skin. It is usually given by a health care professional in a clinic or doctor's office. However, your doctor may teach you how to inject yourself. Follow all instructions. Talk to your pediatrician regarding the use of this medicine in children. Special care may be needed. Overdosage: If you think you have taken too much of this medicine contact a poison control center or emergency room at once. NOTE: This medicine is only for you. Do not share this medicine with others. What if I miss a dose? If you are given your dose at a clinic or doctor's office, call to reschedule your appointment. If you give your own injections and you miss a dose, take it as soon as you can. If it is almost time for your next dose, take only that dose. Do not take double or extra doses. What may interact with this medicine? -colchicine -heavy alcohol intake This list may not describe all possible  interactions. Give your health care provider a list of all the medicines, herbs, non-prescription drugs, or dietary supplements you use. Also tell them if you smoke, drink alcohol, or use illegal drugs. Some items may interact with your medicine. What should I watch for while using this medicine? Visit your doctor or health care professional regularly. You may need blood work done while you are taking this medicine. You may need to follow a special diet. Talk to your doctor. Limit your alcohol intake and avoid smoking to get the best benefit. What side effects may I notice from receiving this medicine? Side effects that you should report to your doctor or health care professional as soon as possible: -allergic reactions like skin rash, itching or hives, swelling of the face, lips, or tongue -blue tint to skin -chest tightness, pain -difficulty breathing, wheezing -dizziness -red, swollen painful area on the leg Side effects that usually do not require medical attention (report to your doctor or health care professional if they continue or are bothersome): -diarrhea -headache This list may not describe all possible side effects. Call your doctor for medical advice about side effects. You may report side effects to FDA at 1-800-FDA-1088. Where should I keep my medicine? Keep out of the reach of children. Store at room temperature between 15 and 30 degrees C (59 and 85 degrees F). Protect from light. Throw away any unused medicine after the expiration date. NOTE: This sheet is a summary. It may not cover all possible information. If you have questions about this medicine, talk to your doctor, pharmacist, or   health care provider.  2019 Elsevier/Gold Standard (2007-06-23 22:10:20) Darbepoetin Alfa injection What is this medicine? DARBEPOETIN ALFA (dar be POE e tin AL fa) helps your body make more red blood cells. It is used to treat anemia caused by chronic kidney failure and chemotherapy. This  medicine may be used for other purposes; ask your health care provider or pharmacist if you have questions. COMMON BRAND NAME(S): Aranesp What should I tell my health care provider before I take this medicine? They need to know if you have any of these conditions: -blood clotting disorders or history of blood clots -cancer patient not on chemotherapy -cystic fibrosis -heart disease, such as angina, heart failure, or a history of a heart attack -hemoglobin level of 12 g/dL or greater -high blood pressure -low levels of folate, iron, or vitamin B12 -seizures -an unusual or allergic reaction to darbepoetin, erythropoietin, albumin, hamster proteins, latex, other medicines, foods, dyes, or preservatives -pregnant or trying to get pregnant -breast-feeding How should I use this medicine? This medicine is for injection into a vein or under the skin. It is usually given by a health care professional in a hospital or clinic setting. If you get this medicine at home, you will be taught how to prepare and give this medicine. Use exactly as directed. Take your medicine at regular intervals. Do not take your medicine more often than directed. It is important that you put your used needles and syringes in a special sharps container. Do not put them in a trash can. If you do not have a sharps container, call your pharmacist or healthcare provider to get one. A special MedGuide will be given to you by the pharmacist with each prescription and refill. Be sure to read this information carefully each time. Talk to your pediatrician regarding the use of this medicine in children. While this medicine may be used in children as young as 66 month of age for selected conditions, precautions do apply. Overdosage: If you think you have taken too much of this medicine contact a poison control center or emergency room at once. NOTE: This medicine is only for you. Do not share this medicine with others. What if I miss a  dose? If you miss a dose, take it as soon as you can. If it is almost time for your next dose, take only that dose. Do not take double or extra doses. What may interact with this medicine? Do not take this medicine with any of the following medications: -epoetin alfa This list may not describe all possible interactions. Give your health care provider a list of all the medicines, herbs, non-prescription drugs, or dietary supplements you use. Also tell them if you smoke, drink alcohol, or use illegal drugs. Some items may interact with your medicine. What should I watch for while using this medicine? Your condition will be monitored carefully while you are receiving this medicine. You may need blood work done while you are taking this medicine. This medicine may cause a decrease in vitamin B6. You should make sure that you get enough vitamin B6 while you are taking this medicine. Discuss the foods you eat and the vitamins you take with your health care professional. What side effects may I notice from receiving this medicine? Side effects that you should report to your doctor or health care professional as soon as possible: -allergic reactions like skin rash, itching or hives, swelling of the face, lips, or tongue -breathing problems -changes in vision -chest pain -confusion, trouble  speaking or understanding -feeling faint or lightheaded, falls -high blood pressure -muscle aches or pains -pain, swelling, warmth in the leg -rapid weight gain -severe headaches -sudden numbness or weakness of the face, arm or leg -trouble walking, dizziness, loss of balance or coordination -seizures (convulsions) -swelling of the ankles, feet, hands -unusually weak or tired Side effects that usually do not require medical attention (report to your doctor or health care professional if they continue or are bothersome): -diarrhea -fever, chills (flu-like symptoms) -headaches -nausea, vomiting -redness,  stinging, or swelling at site where injected This list may not describe all possible side effects. Call your doctor for medical advice about side effects. You may report side effects to FDA at 1-800-FDA-1088. Where should I keep my medicine? Keep out of the reach of children. Store in a refrigerator between 2 and 8 degrees C (36 and 46 degrees F). Do not freeze. Do not shake. Throw away any unused portion if using a single-dose vial. Throw away any unused medicine after the expiration date. NOTE: This sheet is a summary. It may not cover all possible information. If you have questions about this medicine, talk to your doctor, pharmacist, or health care provider.  2019 Elsevier/Gold Standard (2017-03-27 16:44:20)

## 2018-07-14 NOTE — Progress Notes (Signed)
CRITICAL VALUE STICKER  CRITICAL VALUE:  Hemoglobin 6.0  RECEIVER (on-site recipient of call): Valda Favia RN  [place] NOTIFIED: 07/14/2018 2:10  MESSENGER (representative from lab): Carver Fila  MD NOTIFIED: Dr. [name]  TIME OF NOTIFICATION: 2:20  RESPONSE: no new orders

## 2018-07-15 ENCOUNTER — Encounter: Payer: Self-pay | Admitting: Critical Care Medicine

## 2018-07-15 ENCOUNTER — Ambulatory Visit: Payer: Medicare Other | Attending: Critical Care Medicine | Admitting: Critical Care Medicine

## 2018-07-15 ENCOUNTER — Telehealth: Payer: Self-pay

## 2018-07-15 ENCOUNTER — Ambulatory Visit: Payer: Medicare Other | Admitting: Primary Care

## 2018-07-15 ENCOUNTER — Telehealth: Payer: Self-pay | Admitting: Hematology

## 2018-07-15 DIAGNOSIS — G893 Neoplasm related pain (acute) (chronic): Secondary | ICD-10-CM

## 2018-07-15 DIAGNOSIS — I4891 Unspecified atrial fibrillation: Secondary | ICD-10-CM

## 2018-07-15 DIAGNOSIS — S83249A Other tear of medial meniscus, current injury, unspecified knee, initial encounter: Secondary | ICD-10-CM | POA: Insufficient documentation

## 2018-07-15 DIAGNOSIS — D630 Anemia in neoplastic disease: Secondary | ICD-10-CM | POA: Diagnosis not present

## 2018-07-15 DIAGNOSIS — R600 Localized edema: Secondary | ICD-10-CM

## 2018-07-15 DIAGNOSIS — C7951 Secondary malignant neoplasm of bone: Secondary | ICD-10-CM

## 2018-07-15 DIAGNOSIS — E440 Moderate protein-calorie malnutrition: Secondary | ICD-10-CM

## 2018-07-15 DIAGNOSIS — I824Y1 Acute embolism and thrombosis of unspecified deep veins of right proximal lower extremity: Secondary | ICD-10-CM

## 2018-07-15 DIAGNOSIS — Z789 Other specified health status: Secondary | ICD-10-CM

## 2018-07-15 DIAGNOSIS — C50919 Malignant neoplasm of unspecified site of unspecified female breast: Secondary | ICD-10-CM

## 2018-07-15 DIAGNOSIS — E049 Nontoxic goiter, unspecified: Secondary | ICD-10-CM | POA: Insufficient documentation

## 2018-07-15 DIAGNOSIS — IMO0001 Reserved for inherently not codable concepts without codable children: Secondary | ICD-10-CM

## 2018-07-15 DIAGNOSIS — Z7189 Other specified counseling: Secondary | ICD-10-CM

## 2018-07-15 DIAGNOSIS — C50912 Malignant neoplasm of unspecified site of left female breast: Secondary | ICD-10-CM

## 2018-07-15 DIAGNOSIS — IMO0002 Reserved for concepts with insufficient information to code with codable children: Secondary | ICD-10-CM | POA: Insufficient documentation

## 2018-07-15 DIAGNOSIS — M1710 Unilateral primary osteoarthritis, unspecified knee: Secondary | ICD-10-CM | POA: Insufficient documentation

## 2018-07-15 DIAGNOSIS — N879 Dysplasia of cervix uteri, unspecified: Secondary | ICD-10-CM | POA: Insufficient documentation

## 2018-07-15 NOTE — Telephone Encounter (Signed)
Call placed to [facility], spoke to [place] who confirmed that the change in medical status was received and the patient was scheduled for an assessment 07/14/2018

## 2018-07-15 NOTE — Progress Notes (Signed)
Called patient to initiate their telephone visit with provider Dr. Joya Gaskins. Verified date of birth. Patient states that her breathing is about the same. Was not able to get the cough syrup from the pharmacy but she states that the cough has gotten better. Does still have some wheezing & SHOB. KWalker, CMA.

## 2018-07-15 NOTE — Progress Notes (Signed)
Patient ID: Michaela Little, female   DOB: [birth year], 58 y.o.   MRN: [id number]  Virtual Visit via Telephone Note  I connected with Michaela Little on 07/15/18 at  3:00 PM EDT by telephone and verified that I am speaking with the correct person using two identifiers.   Consent:  I discussed the limitations, risks, security and privacy concerns of performing an evaluation and management service by telephone and the availability of in person appointments. I also discussed with the patient that there may be a patient responsible charge related to this service. The patient expressed understanding and agreed to proceed.  Location of patient: The patient was at home  Location of provider: I was in my office  Persons participating in the televisit with the patient.  The patient was by herself  History of Present Illness: This is a telephone visit with this patient and I confirm she was the only one on the line when I called This is a follow-up visit from April 7. 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  Since the last visit in our clinic the patient did require an admission to the hospital.  The patient had atrial fibrillation and required intravenous Cardizem and placement then subsequently on low-dose beta-blocker.  The Lovenox was continued.  Patient also had hypercalcemia and she was treated for this related to the malignancy.  She states she is on the Breo inhaler for her breathing but has  not helped as much.  The patient has a dry cough.  Mucus is minimal but thick and white.  There is no hemoptysis.  She still has chest pain.  Note there is been a rib fracture healing on the right.  After hospitalization she went to a nursing home for rehab for 3 weeks then return back to the home environment.  She is been seen by palliative medicine in the past however they have not been back to see her since she has been back home from the nursing home.  The patient continues to follow with oncology for treatment with high-risk medications.  She does have tramadol and she uses this as needed and his does help with her symptoms. With a recent visit she had symptomatic anemia hemoglobin down to 4-1/2 she was able to fill the hemoglobin back up to 6.5  Note she was offered Ensure supplement but she is not been able to afford this medication.  She does have a home health aide.  She would like to see advanced home care reengage along with the palliative medicine team.   07/15/2018 .On today's visit we followed up on several issues.  Her atrial fibrillation has been stable and her heart rate has not had significant elevation.  However her breast cancer has appeared to have progressed.  She had called in wanting to be seen for vaginal irritation but her oncology physician saw her first and determine there was breast cancer spread to the vulvar pelvic and perineal area and this is caused some irritation.  She also has nodules on the skin on the back as well.  Because  of her blood counts her chemotherapy has been put on hold and the patient is uncertain she will pursue further chemotherapy.  She will continue her B12 injections and erythropoietin injections for her bone marrow.  Also the patient notes that Breo inhaler was causing cough and she has stopped this medication she did not get the Hycodan filled and the cough is actually improved without the Hycodan.  She still having persistent emesis.  Palliative care  is visiting her in the home now.  She is also being evaluated for additional pelvic personal care services and [facility] is assisting in this as well [facility] is also assisting.  The patient knows she can accelerate to hospice care if she makes that decision and she was not ready at this time to make that decision.  She did receive a supply of Ensure in the home for nutrition support.  Her pain control is adequate at this time.  The patient does have a nebulized medication which she will use if needed for her asthma.  Her blood pressure medications are unchanged.  She does maintain the Lovenox daily. Observations/Objective: This was a telephone visit and the patient was not observed [place] databased controlled substance reviewed  Assessment and Plan: #1 atrial fibrillation paroxysmal now in sinus rhythm  Will continue Coreg  and continue the Lovenox for embolic proph and VTE  prophylaxis as well  #2 recurrent venous thrombosis secondary to hypercoagulable state from malignancy: We will continue the Lovenox 100 mg subcutaneous daily  #3 moderate persistent asthma: We will discontinue Breo as it is causing cough and have the patient use nebulized therapy only   #4 malnutrition to a significant degree  For this we will continue Ensure supplementation   #5 palliative care patient  Palliative care is seeing the patient in the home and the patient understands and knows that she can accelerate to hospice care  #6 metastatic breast cancer per oncology   Follow Up Instructions: Another tele-visit will be made in 1 week     I discussed the assessment and treatment plan with the patient. The patient was provided an opportunity to ask questions and all were answered. The patient agreed with the plan and demonstrated an understanding of the instructions.   The patient was advised to call back or seek an in-person evaluation if the symptoms worsen or if the condition fails to improve as  anticipated.  I provided 15 minutes of non-face-to-face time during this encounter  including  median intraservice time , review of notes, labs, imaging, medications  and explaining diagnosis and management to the patient .    Asencion Noble, MD

## 2018-07-15 NOTE — Telephone Encounter (Signed)
Scheduled appt per 4/20 los. °

## 2018-07-18 ENCOUNTER — Telehealth: Payer: Self-pay | Admitting: Internal Medicine

## 2018-07-18 NOTE — Telephone Encounter (Signed)
Tom from [facility] called in stated they had a missed visit 4/24 due to patient reporting she was sick took temp before home visit with physical therapist assistant and her temp 99.9

## 2018-07-18 NOTE — Telephone Encounter (Signed)
FYI  Will route to pcp

## 2018-07-18 NOTE — Telephone Encounter (Signed)
Patients call returned.  Patient identified by name and date of birth.  Patient reportedly had a temp in the AM of 99.9  Patient denied any other symptoms. Temp was taken later and was 98.7.  Patient stated she took tylenol.  Patient states fever has not returned.  Patient was given a GYN referal for her vaginal growths but never heard from GYN.  Oncologist saw her and examined her and stated that the growths could possibly be cancer as well as a growth on her back.  Oncologist stated they wouldn't take a biopsy due to the concern for the patient bleeding.  Patient asked if anything else she needed.  Patient denied any other need.

## 2018-07-18 NOTE — Telephone Encounter (Signed)
Michaela Little, can you please check to see if this patient is sick and needs an urgent care visit or ED visit. Thanks.

## 2018-07-23 ENCOUNTER — Ambulatory Visit: Payer: Medicare Other | Attending: Internal Medicine | Admitting: Physician Assistant

## 2018-07-23 ENCOUNTER — Other Ambulatory Visit: Payer: Self-pay

## 2018-07-23 DIAGNOSIS — R1111 Vomiting without nausea: Secondary | ICD-10-CM | POA: Diagnosis not present

## 2018-07-23 MED ORDER — ONDANSETRON HCL 8 MG PO TABS: 8.0000 mg | ORAL_TABLET | Freq: Two times a day (BID) | ORAL | 1 refills | Status: DC

## 2018-07-23 NOTE — Progress Notes (Signed)
Virtual Visit via Telephone Note  I connected with Michaela Little on 07/23/18 at  3:30 PM EDT by telephone and verified that I am speaking with the correct person using two identifiers.   I discussed the limitations, risks, security and privacy concerns of performing an evaluation and management service by telephone and the availability of in person appointments. I also discussed with the patient that there may be a patient responsible charge related to this service. The patient expressed understanding and agreed to proceed.  Patient location:  home My Location:  [place] office Persons on the call:  Myself and the patient.     History of Present Illness: For about 3 days the patient has been having gas and bloating. Root beer helped some with this.   Bowels are moving normally.  She has been having about 1 episode daily of vomiting without nausea.  Comes on suddenly.  Appetite is good.  Complicated medical hx(see last note).  No SOB.  No CP.  No changes in BM.  Vomited up zofran.  No urinary s/sx.      Observations/Objective:  TP linear.  A&Ox3.  Speech is clear.   Assessment and Plan: 1. Vomiting without nausea, intractability of vomiting not specified, unspecified vomiting type Take twice daily to ward off episodes of vomiting.  If fever/abdominal pain, intractible vomiting develop, to ED. - ondansetron (ZOFRAN) 8 MG tablet; Take 1 tablet (8 mg total) by mouth 2 (two) times daily.  Dispense: 60 tablet; Refill: 1  Follow Up Instructions: 1 month with PCP   I discussed the assessment and treatment plan with the patient. The patient was provided an opportunity to ask questions and all were answered. The patient agreed with the plan and demonstrated an understanding of the instructions.   The patient was advised to call back or seek an in-person evaluation if the symptoms worsen or if the condition fails to improve as anticipated.  I provided 15 minutes of non-face-to-face time during this  encounter.   Freeman Caldron, PA-C  Patient ID: Michaela Little, female   DOB: 05-15-1960, 58 y.o.   MRN: [id number]

## 2018-07-23 NOTE — Telephone Encounter (Signed)
Pt aware of appointment information. She has an appointment 08/15/2018 at 10:55 am. Given address to location of [place].    Pt verbalized understanding.

## 2018-07-23 NOTE — Progress Notes (Signed)
LBM- today  Abdominal discomfort and vomitting Flatulence and indigestion

## 2018-07-23 NOTE — Telephone Encounter (Signed)
Pt called in stated she wanted to speak with triage nurse in regards   To the referral for GYN states she has still not heard anything please follow up

## 2018-07-25 ENCOUNTER — Other Ambulatory Visit: Payer: Self-pay

## 2018-07-25 ENCOUNTER — Ambulatory Visit: Payer: Medicare Other

## 2018-07-25 ENCOUNTER — Other Ambulatory Visit: Payer: Medicare Other

## 2018-07-25 ENCOUNTER — Encounter (HOSPITAL_COMMUNITY)
Admission: RE | Admit: 2018-07-25 | Discharge: 2018-07-25 | Disposition: A | Discharge status: Home / Self Care | Payer: Medicare Other | Source: Ambulatory Visit | Attending: Hematology | Admitting: Hematology

## 2018-07-25 DIAGNOSIS — C7951 Secondary malignant neoplasm of bone: Secondary | ICD-10-CM | POA: Diagnosis not present

## 2018-07-25 DIAGNOSIS — I10 Essential (primary) hypertension: Secondary | ICD-10-CM | POA: Diagnosis not present

## 2018-07-25 DIAGNOSIS — E785 Hyperlipidemia, unspecified: Secondary | ICD-10-CM | POA: Insufficient documentation

## 2018-07-25 DIAGNOSIS — C50812 Malignant neoplasm of overlapping sites of left female breast: Secondary | ICD-10-CM | POA: Insufficient documentation

## 2018-07-25 DIAGNOSIS — C787 Secondary malignant neoplasm of liver and intrahepatic bile duct: Secondary | ICD-10-CM | POA: Diagnosis not present

## 2018-07-25 DIAGNOSIS — Z170 Estrogen receptor positive status [ER+]: Secondary | ICD-10-CM | POA: Insufficient documentation

## 2018-07-25 DIAGNOSIS — I700 Atherosclerosis of aorta: Secondary | ICD-10-CM | POA: Diagnosis not present

## 2018-07-25 DIAGNOSIS — I517 Cardiomegaly: Secondary | ICD-10-CM | POA: Diagnosis not present

## 2018-07-25 LAB — GLUCOSE, CAPILLARY: Glucose-Capillary: 94 mg/dL (ref 70–99)

## 2018-07-25 MED ORDER — FLUDEOXYGLUCOSE F - 18 (FDG) INJECTION
10.6500 | Freq: Once | INTRAVENOUS | Status: AC | PRN
  Administered 2018-07-25: 10.65 via INTRAVENOUS

## 2018-07-25 NOTE — Progress Notes (Signed)
[place]   [phone] [phone]) [phone]   Clinic Follow up Note   Patient Care Team: Ladell Pier, MD as PCP - General (Internal Medicine) [name], MD as Consulting Physician (Pulmonary Disease) [name], MD as Consulting Physician (Hematology) Clent Demark, PA-C as Physician Assistant (Physician Assistant) Berneta Sages, NP as Nurse Practitioner (Adult Health Nurse Practitioner)  Date of Service:  07/28/2018  CHIEF COMPLAINT: F/u of metastatic breast cancer and anemia  SUMMARY OF ONCOLOGIC HISTORY: Oncology History   Cancer Staging Malignant neoplasm of overlapping sites of left breast in female, estrogen receptor positive ([place]) Staging form: Breast, AJCC 7th Edition - Clinical: Stage IIIA (T2, N2, M0) - Unsigned       Malignant neoplasm of overlapping sites of left breast in female, estrogen receptor positive ([place])   05/2008 Cancer Diagnosis    Left sided breast cancer (no path report available)    11/2008 Pathologic Stage    Stage IIIA: T2 N2     Neo-Adjuvant Chemotherapy    Paclitaxel weekly x 12; doxorubicin and cyclophosphamide x 4 - both given with trifiparnib (treated at [facility] in [place])    11/2008 Definitive Surgery    Left modified radiation mastectomy: invasive lobular carcinoma, ER+, PR+, HER2/neu negative, 5/22 LN positive     - 03/2009 Radiation Therapy    Adjuvant radiation to left chest wall    2011 - 08/2014 Anti-estrogen oral therapy    Tamoxifen 20 mg used briefly; discontinued due to uterine lining concerns; changed to anastrozole 1 mg daily (began 07/2100); discontinued by patient summer of 2016    05/26/2015 Progression    Iliac bone biopsy showed metastatic carcinoma consistent with a breast primary, ER positive, PR and HER-2 negative. Her staging CT, and a PET scan was negative for visceral metastasis.    05/2015 - 01/2018 Anti-estrogen oral therapy    1. letrozole and Ibrance started March  2017             -Ibrance dose decreased to 75 mg daily. Stopped in 11/2017 due to anemia   -Letrozole switched to Exemestane on 02/13/17 due to b/l rib pain. She exemestane herself in 01/2018.      11/16/2015 Miscellaneous    Xgeva monthly for bone metastasis     07/03/2016 Imaging    CT chest, abdomen and pelvis with contrast showed no evidence of metastasis.     02/07/2017 Imaging    CT AP W Contrast 02/07/17 IMPRESSION: 1. No CT findings for abdominal/pelvic metastatic disease. 2. No acute abdominal findings, mass lesions or adenopathy. 3. Status post cholecystectomy with mild associated common bile duct dilatation. 4. Somewhat thickened appendix appears relatively stable. No acute inflammatory process. 5. Stable mixed lytic and sclerotic osseous metastatic disease.     02/07/2017 Imaging    Bone Scan Whole Body 02/07/17 IMPRESSION: No scintigraphic evidence skeletal metastasis     08/12/2017 Imaging    Whole Boday Scan 08/12/17 IMPRESSION: Scattered degenerative type uptake as above with a questionable focus of increased tracer localization at L2 vertebral body versus artifact; no abnormality is seen at this site by CT. Consider either characterization by MR or attention on follow-up imaging.    08/12/2017 Imaging    CT AP W Contrast 08/12/17  IMPRESSION: Continued thickening of the appendix is noted without surrounding inflammation. It has maximum measured diameter is decreased compared to prior exam. There is no evidence of acute inflammation. Stable mixed lytic and sclerotic appearance of  visualized skeleton is noted consistent with history of known osseous metastases. No acute abnormality seen in the abdomen or pelvis.    08/22/2017 Surgery    APPENDECTOMY LAPAROSCOPIC ERAS PATHWAY by Dr. Malcolm Metro and REMOVAL OF LEFT TISSUE EXPANDER by Dr. Harlow Mares 08/22/17    08/22/2017 Pathology Results    Diagnosis 08/22/17  1. Appendix, Other than Incidental - METASTATIC  CARCINOMA, CONSISTENT WITH BREAST PRIMARY. - CARCINOMA IS PRESENT AT THE SURGICAL RESECTION MARGIN AS WELL AS THE SEROSAL SURFACE. - LYMPHOVASCULAR INVASION IS IDENTIFIED. - SEE COMMENT. 2. Breast, capsule, Left - DENSE FIBROUS TISSUE WITH MILD INFLAMMATION, INCLUDING A FEW SCATTERED MULTINUCLEATED GIANT CELLS. - THERE IS NO EVIDENCE OF MALIGNANCY.    08/27/2017 Imaging    CT AP W Contrast 08/27/17 IMPRESSION: No evidence of pulmonary embolus. Small pericardial effusion with uncertain clinical significance. No evidence of acute abnormalities within the chest, abdomen or pelvis. Area of fat stranding and small focus of gas within the anterior abdominal wall, immediately inferior to the umbilicus, probably at the site of laparoscopic access.    10/07/2017 Echocardiogram    10/07/2017 ECO Study Conclusions  - Procedure narrative: Transthoracic echocardiography. Image   quality was adequate. The study was technically difficult, as a   result of poor acoustic windows. - Left ventricle: The cavity size was normal. Wall thickness was   normal. Systolic function was normal. The estimated ejection   fraction was in the range of 60% to 65%. Wall motion was normal;   there were no regional wall motion abnormalities. Features are   consistent with a pseudonormal left ventricular filling pattern,   with concomitant abnormal relaxation and increased filling   pressure (grade 2 diastolic dysfunction).    01/16/2018 PET scan    01/16/2018 PET Scan IMPRESSION: 1. Most striking finding is intense marrow activity diffusely throughout the axillary and appendicular skeleton. This is favored a physiologic marrow response from chronic anemia rather than diffuse metastatic disease. 2. Several discrete foci of more intense activity noted in the spine which could indicate malignancy or trauma. Difficult to define malignancy on the background of diffuse marrow activity. 3. Diffuse sclerosis throughout  the bones similar to a recent CT scans but new from remote PET-CT scan 03/01/2016. 4. No evidence soft tissue metastasis.     04/02/2018 Imaging    CT Angio CAP 04/02/18  IMPRESSION: 1. Multiple new hepatic lesions highly concerning for developing metastatic disease to the liver. This could be confirmed with nonemergent MRI of the abdomen with and without IV gadolinium if clinically appropriate. 2. Widespread metastatic disease to the bones also appears progressive compared to prior examinations. 3. Large right-sided pelvic deep venous thrombosis extending from the right common femoral vein to the proximal right external iliac vein. IVC filter is in position. No clot identified in the IVC filter at this time. No pulmonary embolism on today's examination. 4. Aortic atherosclerosis. 5. Additional incidental findings, as above.    05/01/2018 -  Anti-estrogen oral therapy    Fulvestrant injections 546m monthly starting 05/01/18 -added oral Piqray 3033mdaily starting this week    06/30/2018 -  Antibody Plan    Monthly Xgeva restarted 06/30/18     07/25/2018 PET scan    PET  IMPRESSION: 1. Marked progression of metastatic disease with extensive tumor burden in the skeleton and liver. Multifocal intramuscular metastatic lesions in the paraspinal musculature and pelvic musculature. 2. The bony lesions include a lytic lesion of the left side of the clivus/skull base. 3. Mild  to moderate right hydronephrosis and delayed renal excretion due to suspected tumor along the right renal pelvis. There is also suspected right perirenal tumor posteriorly, and a suspected small metastatic lesion to the left adrenal gland. 4. Other imaging findings of potential clinical significance: Aortic Atherosclerosis (ICD10-I70.0). Mild cardiomegaly. An IVC filter is in place.      CURRENT THERAPY:  -Aranesp injection every 2 weeks -B12 injectionsmonthly  -Lovenox 175m q12h, changed to 1082mevery 12 hours  in early Feb 2020 at home (adjusted based on her weight) -Monthlyfulvestrant injection50077mtarting on 2/8/20with loading dose for first month. Monthly starting 06/27/18. --Added Piqray 300m27mily starting in 1-2 week  -Monthly Xgeva restarted 06/30/18   INTERVAL HISTORY:  Michaela J ScJASLYNN THOMEhere for a follow up and injection. She presents to the clinic today by herself. She notes her stomach is gassy which is the only cause of her abdominal pain. This goes away on its on. She also has right flank pain related to her liver mets and eating too much. She notes she also has a headache. She notes her cough resolved. She still has SOB. She denies constipation currently as she uses laxative as needed. She does not have stool softeners. She notes her LE swelling is very mild now. She feels her skin nodules are stable.  She continues to have home aid 2 hours daily.   REVIEW OF SYSTEMS:   Constitutional: Denies fevers, chills or abnormal weight loss (+) headaches  Eyes: Denies blurriness of vision Ears, nose, mouth, throat, and face: Denies mucositis or sore throat Respiratory: Denies cough or wheezes (+) SOB Cardiovascular: Denies palpitation, chest discomfort (+) Very mild  lower extremity swelling Gastrointestinal:  Denies nausea, heartburn or change in bowel habits (+) abdominal gas (+) Right flank pain with eating Skin: Denies abnormal skin rashes (+) Skin nodules on her back stable.  Lymphatics: Denies new lymphadenopathy or easy bruising Neurological:Denies numbness, tingling or new weaknesses Behavioral/Psych: Mood is stable, no new changes  All other systems were reviewed with the patient and are negative.  MEDICAL HISTORY:  Past Medical History:  Diagnosis Date  . Anxiety   . Arthritis    hands, knees, hips  . Asthma   . Bronchitis   . Cancer (HCC)[place]. Cervical stenosis (uterine cervix)   . Disorder of appendix    Enlarged  . Dyspnea on exertion   . GERD (gastroesophageal reflux  disease)   . Headache(784.0)   . History of breast cancer    2010--  LEFT  s/[place]  [place]--  NO RECURRENCE  . History of cervical dysplasia   . Hyperlipidemia   . Hypertension   . Malignant neoplasm of overlapping sites of left breast in female, estrogen receptor positive (HCC)[place]/01/2013  . OSA (obstructive sleep apnea) moderate osa per study  09/2010   CPAP  , NOT USING ON REGULAR BASIS  . Pelvic pain in female   . Positive H. pylori test    08-05-2013  . Positive TB test    [place] TE[place]--  [place]/ MEDS  . Refusal of blood transfusions as patient is Jehovah's Witness   . Seasonal allergies   . Uterine fibroid   . Wears glasses     SURGICAL HISTORY: Past Surgical History:  Procedure Laterality Date  . APPENDECTOMY  y  . CARDIOVASCULAR STRESS TEST  10-29-2012   low risk perfusion study/  no significant reversibity/ ef 66%/  normal wall motion  .  CERVICAL CONIZATION W/BX  2012   in  [place] N/A 05/14/2012   Procedure: LAPAROSCOPIC CHOLECYSTECTOMY;  Surgeon: Ralene Ok, MD;  Location: [facility];  Service: General;  Laterality: N/A;  . COLONOSCOPY  2011   normal per patient - [place]  . DILATION AND CURETTAGE OF UTERUS  10/15/2011   Procedure: DILATATION AND CURETTAGE;  Surgeon: Melina Schools, MD;  Location: [facility];  Service: Gynecology;  Laterality: N/A;  Conization &  endocervical curettings  . EXAMINATION UNDER ANESTHESIA N/A 08/20/2013   Procedure: EXAM UNDER ANESTHESIA;  Surgeon: Margarette Asal, MD;  Location: [facility];  Service: Gynecology;  Laterality: N/A;  . [place]   right  . IR IVC FILTER PLMT / S&I /IMG GUID/MOD SED  03/01/2018  . IR RADIOLOGIST EVAL & MGMT  04/08/2018  . LAPAROSCOPIC APPENDECTOMY N/A 08/22/2017   Procedure: [place];  Surgeon: Coralie Keens, MD;  Location: [facility];  Service: General;  Laterality: N/A;  . LAPAROSCOPIC ASSISTED VAGINAL HYSTERECTOMY N/A 10/26/2014    Procedure: HYSTERECTOMY ABDOMINAL ;  Surgeon: Molli Posey, MD;  Location: [facility];  Service: Gynecology;  Laterality: N/A;  . MASTECTOMY Left 11/2008  in [place]   . SALPINGOOPHORECTOMY Bilateral 10/26/2014   Procedure: BILATERAL SALPINGO OOPHORECTOMY;  Surgeon: Molli Posey, MD;  Location: [facility];  Service: Gynecology;  Laterality: Bilateral;  . TISSUE EXPANDER PLACEMENT Left 08/22/2017   Procedure: REMOVAL OF LEFT TISSUE EXPANDER;  Surgeon: Crissie Reese, MD;  Location: [facility];  Service: Plastics;  Laterality: Left;  . TISSUE EXPANDER REMOVAL Left 08/22/2017  . TRANSTHORACIC ECHOCARDIOGRAM  10-29-2012   mild lvh/  ef 60-65%/  grade II diastolic dysfunction/  trivial mr  &  tr    I have reviewed the social history and family history with the patient and they are unchanged from previous note.  ALLERGIES:  is allergic to lisinopril-hydrochlorothiazide; adhesive [tape]; fentanyl; gabapentin; hctz [hydrochlorothiazide]; latex; lisinopril; losartan potassium; other; oxycodone; and tums [calcium carbonate antacid].  MEDICATIONS:  Current Outpatient Medications  Medication Sig Dispense Refill  . acetaminophen (TYLENOL) 500 MG tablet Take 500 mg by mouth every 6 (six) hours as needed for mild pain.    . carvedilol (COREG) 12.5 MG tablet Take 1 tablet (12.5 mg total) by mouth 2 (two) times daily with a meal. 60 tablet 3  . cetirizine (ZYRTEC) 10 MG tablet Take 10 mg by mouth daily as needed for allergies or rhinitis.   11  . docusate sodium (COLACE) 100 MG capsule Take 1 capsule (100 mg total) by mouth daily as needed for mild constipation. (Patient taking differently: Take 200 mg by mouth every other day. ) 10 capsule 0  . enoxaparin (LOVENOX) 100 MG/ML injection 100 mg 2 (two) times daily.    . feeding supplement, ENSURE ENLIVE, (ENSURE ENLIVE) LIQD Take 237 mLs by mouth 2 (two) times daily between meals. (Patient taking differently: Take 237 mLs by mouth See admin instructions.  Drink 237 ml's by mouth two to three times a day (Non-dairy Ensure)) 237 mL 12  . fluticasone (FLONASE) 50 MCG/ACT nasal spray Place 1 spray into both nostrils daily.  2  . furosemide (LASIX) 20 MG tablet Take 20 mg by mouth daily as needed for fluid.    . hydrALAZINE (APRESOLINE) 10 MG tablet Take 1 tablet (10 mg total) by mouth 3 (three) times daily. 90 tablet 0  . ipratropium-albuterol (DUONEB) 0.5-2.5 (3) MG/3ML SOLN  INHALE THREE MLS VIA NEBULIZER EVERY 6 HOURS AS NEEDED 360 mL 5  . ondansetron (ZOFRAN) 8 MG tablet Take 1 tablet (8 mg total) by mouth 2 (two) times daily. 60 tablet 1  . traMADol (ULTRAM) 50 MG tablet Take 2 tablets (100 mg total) by mouth every 6 (six) hours. (Patient taking differently: Take 50 mg by mouth every 6 (six) hours as needed for moderate pain or severe pain. ) 30 tablet 0  . alpelisib (PIQRAY, 300 MG DAILY DOSE,) 2 x 150 MG Therapy Pack Take two 1102m tablets with food at the same time daily. Swallow whole, do not crush, chew, or split. 60 each 2   No current facility-administered medications for this visit.     PHYSICAL EXAMINATION: ECOG PERFORMANCE STATUS: 3 - Symptomatic, >50% confined to bed  Vitals:   07/28/18 0941  BP: 134/77  Pulse: 81  Resp: 18  Temp: 98.7 F (37.1 C)  SpO2: 100%   Filed Weights   07/28/18 0941  Weight: 202 lb 14.4 oz (92 kg)    GENERAL:alert, no distress and comfortable SKIN: skin color, texture, turgor are normal, no rashes or significant lesions EYES: normal, Conjunctiva are pink and non-injected, sclera clear OROPHARYNX:no exudate, no erythema and lips, buccal mucosa, and tongue normal  NECK: supple, thyroid normal size, non-tender, without nodularity LYMPH:  no palpable lymphadenopathy in the cervical, axillary or inguinal LUNGS: clear to auscultation and percussion with normal breathing effort HEART: regular rate & rhythm and no murmurs and no lower extremity edema ABDOMEN:abdomen soft, non-tender and normal bowel  sounds Musculoskeletal:no cyanosis of digits and no clubbing  NEURO: alert & oriented x 3 with fluent speech, no focal motor/sensory deficits  LABORATORY DATA:  I have reviewed the data as listed CBC Latest Ref Rng & Units 07/28/2018 07/14/2018 06/30/2018  WBC 4.0 - 10.5 K/uL 4.1 4.0 3.7(L)  Hemoglobin 12.0 - 15.0 g/dL 6.9(LL) 6.0(LL) 6.3(LL)  Hematocrit 36.0 - 46.0 % 24.9(L) 20.6(L) 22.1(L)  Platelets 150 - 400 K/uL 143(L) 124(L) 125(L)     CMP Latest Ref Rng & Units 06/30/2018 06/13/2018 06/02/2018  Glucose 70 - 99 mg/dL 124(H) 107(H) 96  BUN 6 - 20 mg/dL _0 Creatinine 0.44 - 1.00 mg/dL 1.13(H) 0.97 1.07(H)  Sodium 135 - 145 mmol/L 137 139 138  Potassium 3.5 - 5.1 mmol/L 3.8 4.0 3.5  Chloride 98 - 111 mmol/L 105 106 103  CO2 22 - 32 mmol/L 21(L) 22 25  Calcium 8.9 - 10.3 mg/dL 12.5(H) 9.6 13.0(H)  Total Protein 6.5 - 8.1 g/dL 7.6 6.9 -  Total Bilirubin 0.3 - 1.2 mg/dL 1.2 1.0 -  Alkaline Phos 38 - 126 U/L 213(H) 174(H) -  AST 15 - 41 U/L 54(H) 33 -  ALT 0 - 44 U/L 21 15 -      RADIOGRAPHIC STUDIES: I have personally reviewed the radiological images as listed and agreed with the findings in the report. No results found.   ASSESSMENT & PLAN:  CLILLYBETH TALis a 58y.o. female with   1. 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 -Unfortunately she developed bone metastasis 1 year after she stoppinganastrozole.She was treated withAI and Ibrance.Ibrance wasstopped in 11/2017 due to severe anemia. She also stoppedexemestane in late November 2019 herself.  -Wepreviouslydiscussed her CT CAP from 04/02/18 which shows disease progression in bone and metastasis to liver. -  Given her FOgenomic test resultsshowPIK3CA mutation,I recommendedAlpelisib [facility]), along with fulvestrant injection,  however she was very concerned about side effects especially risk of anemia from [facility] and previously declined.  -We reviewed her PET images from 07/25/18 in person and discussed the findings, It shows significant disease progression with metastatic disease diffusely through out skeleton, liver, with new metastatic muscle lesions.   -I discussed fulvestrant injections alone are not controlling her disease. Given her overall poor performance status, and severe anemia, she is not a candidate for chemotherapy. I discussed the options of adding oral Piqray to fulvestrant injection or stop further treatment and proceed with palliative care and hospice. She opted to proceed with Piqray and fulvestrant. I reviewed side effects with her in detail, especially high risk of hyperglycemia and diabetes, decreased appetite, nausea, gastric discomfort, diarrhea, abnormal liver function, and a very small risk of anemia, etc.  After lengthy discussion, she agrees to proceed.  Prescription was called in to [facility] today. -She understands the goal of therapy is palliative, to prolong her life and improve her quality of life. -she has no history of DM, will get baseline hemoglobin A1c on next blood draw, and monitor every 3 months.  Will monitor her random blood glucose every 1 to 2 weeks for the first 1 to 2 months -Given her skin nodules were hypermetabolic on PET scan, it is highly suspicions for cancer metastasis. I do not think a punch biopsy is necessary.  -Labs reviewed, CBC WNL except Hg 6.9, PLT 143K. Will proceed with fulvestrant, Xgeva and Aranesp injections.  -I called in Piqray 326m daily today and will start when she receives it. I encouraged her to contact clinic if she develops unexpected or significant side effects.  -F/u in 2 weeks after she starts [place]  2.Bone metastasis, hypercalcemiasecondary to her malignancy -She was previously on Xgeva, stopped when she developed a severe anemia.  She received a Zometa on1/21/2020 for severe hypercalcemiaand again on 06/03/18. -Restarted monthly Xgeva on 06/30/18. Next injection today (07/28/18) -Continue to monitorrenal function andhypercalcemia.  3.Severe anemia  -She developed worsening anemia in September 2019, lowest hemoglobin 3.9, likely secondary to [place]and diffuse bone metastasis  -She is a Jehovah witness, refuses blood transfusion -She has been on Aranesp injection since then, anemia slowly improving. Will continue 5070minjections every 2 weeks, will stop if Hb above 7-8.  -will continue B12 injection monthly -She has not been able to tolerate oral iron due to constipation and nausea so I d/c.  -HG at 6.9today (07/28/18). Proceed with Aranesptoday and continue B12injectionsmonthly   4. Right LE DVT, Recent PE -She was advised to restarted Lovenox 8043mbut stopped on her own due to the concerns of bleeding -She was hospitalized on 02/27/18 for multiple PE. This is likely related to metastatic cancer, immobility and Aranesp injections.Patient wishes to continue Aranesp injection despite the risk of thrombosis. -ShNunzio Corypeatedlydeclined thrombectomy and Angovac which was offeredby IR Dr. McCGeroge BasemanHer ankle swelling hadsignificantly decreased but still has swelling in herrightknee and upper leg. Stable.  -Due to her recent weight loss.She iscurrentlyon Lovenox 100 mg every 12 hours at home, continue.  5. Cough and Dyspnea -f/u with pulmonologist Dr. WerMelvyn Novasn Flovent,Cough improved with Hycodan, continue -3/2020CT Chestshownormal lung. -Not currently on Continuous O2 canula, 2L -Coughhas resolved. Has stable SOB.   6. Body pain -She has been having bilateral rib cage pain from her aggressive coughing and probably related to her bone metastasis -Take tramadol as  neededonce a day.She does not feel she needsstrongerpain medication.  -She feels she may have another rib fracture from her  aggressive coughing. Will monitor. Improving.  -Pain is now mainly intermittent from liver mets and abdominal gas.   7. Afib -Since last visit she was hospitalized on 05/30/18 for atrial fibrillation  -She was treated with Cardizem and discharged with coreg. Continue  -She was recently in nursing home rehab.   8. Goal of care discussion -Weagain discussed the incurable nature of her cancer. She has had significant disease progression and the overall prognosis is very poor, given very limited treatment options -The patient understands the goal of care is palliative. -I recommend DNR/DNI and again encourage her to consider and discuss with her children, she will think about this. She does has a living will and POA in place.   9. Skin nodularity of labia, back and under right breast  -Started about 2-3 weeks ago in 06/2018, possible metastasis from her breast cancer, folliculitis or reactive. -Her PET from 07/25/18 shows uptake in those regions indicating cancer mets. Will monitor and will continue Fulvestrant.    Plan -I prescribed her [place] today to start as soon as she gets it -Labs reviewed and adequate to proceed with Fulvestrant, Xgeva and Aranesp today -Continue 166m Lovenox injections BID at home -Injection in 2, 4 and 6 weeks -F/u in 2 weeks after she starts Piqray    No problem-specific Assessment & Plan notes found for this encounter.   Orders Placed This Encounter  Procedures  . CMP ([place])    Standing Status:   Standing    Number of Occurrences:   50    Standing Expiration Date:   07/28/2023  . CA 27.29    Standing Status:   Standing    Number of Occurrences:   30    Standing Expiration Date:   07/28/2023  . Hemoglobin A1c    Standing Status:   Standing    Number of Occurrences:   10    Standing Expiration Date:   07/28/2023   All questions were answered. The patient knows to call the clinic with any problems, questions or concerns. No barriers to  learning was detected. I spent 35 minutes counseling the patient face to face. The total time spent in the appointment was 40 minutes and more than 50% was on counseling and review of test results     YTruitt Merle MD 07/28/2018   I, AJoslyn Devon am acting as scribe for YTruitt Merle MD.   I have reviewed the above documentation for accuracy and completeness, and I agree with the above.

## 2018-07-28 ENCOUNTER — Other Ambulatory Visit: Payer: Self-pay

## 2018-07-28 ENCOUNTER — Other Ambulatory Visit: Payer: Medicare Other

## 2018-07-28 ENCOUNTER — Inpatient Hospital Stay (HOSPITAL_BASED_OUTPATIENT_CLINIC_OR_DEPARTMENT_OTHER): Payer: Medicare Other | Admitting: Hematology

## 2018-07-28 ENCOUNTER — Encounter: Payer: Self-pay | Admitting: Hematology

## 2018-07-28 ENCOUNTER — Telehealth: Payer: Self-pay | Admitting: Pharmacist

## 2018-07-28 ENCOUNTER — Ambulatory Visit: Payer: Medicare Other

## 2018-07-28 ENCOUNTER — Telehealth: Payer: Self-pay | Admitting: Hematology

## 2018-07-28 ENCOUNTER — Inpatient Hospital Stay: Payer: Medicare Other

## 2018-07-28 ENCOUNTER — Inpatient Hospital Stay: Payer: Medicare Other | Attending: Hematology

## 2018-07-28 ENCOUNTER — Telehealth: Payer: Self-pay

## 2018-07-28 ENCOUNTER — Telehealth: Payer: Self-pay | Admitting: Internal Medicine

## 2018-07-28 VITALS — BP 134/77 | HR 81 | Temp 98.7°F | Resp 18 | Ht 67.0 in | Wt 202.9 lb

## 2018-07-28 DIAGNOSIS — R0602 Shortness of breath: Secondary | ICD-10-CM

## 2018-07-28 DIAGNOSIS — C50812 Malignant neoplasm of overlapping sites of left female breast: Secondary | ICD-10-CM | POA: Insufficient documentation

## 2018-07-28 DIAGNOSIS — C50912 Malignant neoplasm of unspecified site of left female breast: Secondary | ICD-10-CM

## 2018-07-28 DIAGNOSIS — Z170 Estrogen receptor positive status [ER+]: Principal | ICD-10-CM

## 2018-07-28 DIAGNOSIS — Z79899 Other long term (current) drug therapy: Secondary | ICD-10-CM | POA: Diagnosis not present

## 2018-07-28 DIAGNOSIS — I4891 Unspecified atrial fibrillation: Secondary | ICD-10-CM

## 2018-07-28 DIAGNOSIS — R229 Localized swelling, mass and lump, unspecified: Secondary | ICD-10-CM

## 2018-07-28 DIAGNOSIS — D649 Anemia, unspecified: Secondary | ICD-10-CM | POA: Diagnosis not present

## 2018-07-28 DIAGNOSIS — C7951 Secondary malignant neoplasm of bone: Secondary | ICD-10-CM

## 2018-07-28 DIAGNOSIS — I2699 Other pulmonary embolism without acute cor pulmonale: Secondary | ICD-10-CM

## 2018-07-28 DIAGNOSIS — C787 Secondary malignant neoplasm of liver and intrahepatic bile duct: Secondary | ICD-10-CM | POA: Insufficient documentation

## 2018-07-28 DIAGNOSIS — I82402 Acute embolism and thrombosis of unspecified deep veins of left lower extremity: Secondary | ICD-10-CM

## 2018-07-28 DIAGNOSIS — C785 Secondary malignant neoplasm of large intestine and rectum: Secondary | ICD-10-CM

## 2018-07-28 DIAGNOSIS — Z7189 Other specified counseling: Secondary | ICD-10-CM

## 2018-07-28 DIAGNOSIS — R109 Unspecified abdominal pain: Secondary | ICD-10-CM

## 2018-07-28 DIAGNOSIS — R222 Localized swelling, mass and lump, trunk: Secondary | ICD-10-CM

## 2018-07-28 DIAGNOSIS — M7989 Other specified soft tissue disorders: Secondary | ICD-10-CM

## 2018-07-28 LAB — CBC WITH DIFFERENTIAL/PLATELET
Abs Immature Granulocytes: 0.08 10*3/uL — ABNORMAL HIGH (ref 0.00–0.07)
Basophils Absolute: 0 10*3/uL (ref 0.0–0.1)
Basophils Relative: 1 %
Eosinophils Absolute: 0.1 10*3/uL (ref 0.0–0.5)
Eosinophils Relative: 2 %
HCT: 24.9 % — ABNORMAL LOW (ref 36.0–46.0)
Hemoglobin: 6.9 g/dL — CL (ref 12.0–15.0)
Immature Granulocytes: 2 %
Lymphocytes Relative: 25 %
Lymphs Abs: 1 10*3/uL (ref 0.7–4.0)
MCH: 27.7 pg (ref 26.0–34.0)
MCHC: 27.7 g/dL — ABNORMAL LOW (ref 30.0–36.0)
MCV: 100 fL (ref 80.0–100.0)
Monocytes Absolute: 0.4 10*3/uL (ref 0.1–1.0)
Monocytes Relative: 11 %
Neutro Abs: 2.5 10*3/uL (ref 1.7–7.7)
Neutrophils Relative %: 59 %
Platelets: 143 10*3/uL — ABNORMAL LOW (ref 150–400)
RBC: 2.49 MIL/uL — ABNORMAL LOW (ref 3.87–5.11)
RDW: 18.6 % — ABNORMAL HIGH (ref 11.5–15.5)
WBC: 4.1 10*3/uL (ref 4.0–10.5)
nRBC: 6.8 % — ABNORMAL HIGH (ref 0.0–0.2)

## 2018-07-28 MED ORDER — DARBEPOETIN ALFA 500 MCG/ML IJ SOSY
PREFILLED_SYRINGE | INTRAMUSCULAR | Status: AC
  Filled 2018-07-28: qty 1

## 2018-07-28 MED ORDER — FULVESTRANT 250 MG/5ML IM SOLN
INTRAMUSCULAR | Status: AC
  Filled 2018-07-28: qty 5

## 2018-07-28 MED ORDER — DARBEPOETIN ALFA 500 MCG/ML IJ SOSY: 500.0000 ug | PREFILLED_SYRINGE | Freq: Once | INTRAMUSCULAR | Status: DC

## 2018-07-28 MED ORDER — FULVESTRANT 250 MG/5ML IM SOLN: 500.0000 mg | Freq: Once | INTRAMUSCULAR | Status: DC

## 2018-07-28 MED ORDER — ALPELISIB (300 MG DAILY DOSE) 2 X 150 MG PO TBPK: ORAL_TABLET | ORAL | 2 refills | Status: DC

## 2018-07-28 NOTE — Progress Notes (Signed)
Hemoglobin 6.9 notified Dr. Burr Medico.

## 2018-07-28 NOTE — Telephone Encounter (Signed)
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  Labs from Epic assessed, OK for treatment initiation.  Noted baseline hyperglycemia, no diagnosis of diabetes, not on an anti-diabetic medication, will discuss initiation with MD  Noted severe baseline anemia and mild thrombocytopenia, MD aware and patient is being closely monitored  Current medication list in Epic reviewed, no DDIs with Piqray identified.  Prescription has been e-scribed to the [facility] for benefits analysis and approval by MD.  Oral Oncology Clinic will continue to follow for insurance authorization, copayment issues, initial counseling and start date.  Jesse Mack, PharmD, BCPS, BCOP  07/28/2018 12:09 PM Oral Oncology Clinic [phone] 

## 2018-07-28 NOTE — Telephone Encounter (Signed)
Oral Oncology Patient Advocate Encounter  Received notification from [employer] that prior authorization for Piqray is required.  PA submitted on CoverMyMeds Key [id number] Status is pending  Oral Oncology Clinic will continue to follow.  [place] Patient [place] Phone [phone] Fax [phone] 07/28/2018    1:41 PM

## 2018-07-28 NOTE — Progress Notes (Signed)
Pt didn't receive the CMP needed today for the Xgeva, she didn't want to receive the Faslodex or Aranesp she wants to reschedule for all injections. Will make the physician aware.

## 2018-07-28 NOTE — Telephone Encounter (Signed)
Follow up    Michaela Little from [facility] is also calling to report a missed visit

## 2018-07-28 NOTE — Telephone Encounter (Signed)
Scheduled appt per 5/4 los. °

## 2018-07-28 NOTE — Telephone Encounter (Signed)
Will forward to pcp

## 2018-07-28 NOTE — Telephone Encounter (Signed)
Oral Oncology Patient Advocate Encounter  Prior Authorization for Michaela Little has been approved.    PA# [id number] Effective dates: 07/28/18 through 03/26/19  Oral Oncology Clinic will continue to follow.   Michaela Little Phone [phone] Fax [phone] 07/28/2018    1:44 PM

## 2018-07-28 NOTE — Progress Notes (Signed)
Unable to speak with Dr Burr Medico, made Lenox Ponds, LPN aware.

## 2018-07-28 NOTE — Telephone Encounter (Signed)
New Message   Michaela Little is calling form [facility], states he could not see the pt on last Thursday or Friday due to pt declining services because she was not feeling well and he is wanting to report the missed visits

## 2018-07-28 NOTE — Telephone Encounter (Signed)
The above report in messages noted.

## 2018-07-29 NOTE — Telephone Encounter (Signed)
Oral Chemotherapy Pharmacist Encounter   I spoke with patient and daughter, Michaela Little, for overview of new oral chemotherapy medication: Piqray (alpelisib) for the treatment of metastatic, hormone receptor positive breast cancer, previously treated with endocrine based therapy, with known PIK3CA mutation, in conjunction with Faslodex, planned duration until disease progression or unacceptable toxicity.  Counseled on administration, dosing, side effects, monitoring, drug-food interactions, safe handling, storage, and disposal.  Patient will take Piqray 188m tablets, 2 tablets (300 mg) by mouth once daily with food.  Fluvestrant is being re-started on 07/31/2018.  Piqray start date: 08/04/2018  Side effects include but not limited to: hyperglycemia, rash/hypersensitivity reaction, diarrhea, fatigue, nausea, vomiting, mouth sores, alopecia, taste changes, decreased appetite, severe lung reactions, and arthralgias.  Patient has anti-emetic on hand and knows to take it if nausea develops.   She is already taking ondansetron 833mtwice daily for baseline nausea.  Patient will obtain anti diarrheal and alert the office of 4 or more loose stools above baseline. She tends towards constipation at baseline, but wil alert the office with significant changes in bowel habits.  Discussed need for glucose control prior to PiJudanitiation, and possible need for anti-diabetic agents for the treatment of medication-induced hyperglycemia while on treatment with Piqray.   Baseline hyperglycemia discussed with Dr. FeBurr Medicond with [facility] Discussed possibility of severe cutaneous reactions with Piqray. Patient will alert the office of any dermatologic toxicity that may occur after Piqray initiation. Patient has cetirizine at home that she takes as needed for allergy symptoms. She will start cetirizine daily if skin rash occurs.  Reviewed with patient importance of keeping a medication schedule and plan for any  missed doses.  Medication reconciliation performed and medication/allergy list updated.  Insurance authorization for PiJoylene Draftas been obtained. Test claim at the pharmacy revealed copayment $3.90 for 1st fill of Piqray. This will ship from the [facility] 07/30/2018 to deliver to patient's home on 5/7.  Patient informed the pharmacy will reach out 5-7 days prior to needing next fill of Piqray to coordinate continued medication acquisition to prevent break in therapy.  All questions answered.  [place] voiced understanding and appreciation.   They know to call the office with questions or concerns.  JeJohny DrillingPharmD, BCPS, BCOP  07/29/2018   9:27 AM Oral Oncology Clinic [phone]

## 2018-07-30 MED FILL — PIQRAY 300MG DAILY DOSE 2X1: 2 X 150 | 28 days supply | Qty: 56 | Fill #0

## 2018-07-31 ENCOUNTER — Other Ambulatory Visit: Payer: Self-pay | Admitting: Pharmacist

## 2018-07-31 ENCOUNTER — Inpatient Hospital Stay: Payer: Medicare Other

## 2018-07-31 ENCOUNTER — Other Ambulatory Visit: Payer: Self-pay

## 2018-07-31 DIAGNOSIS — D493 Neoplasm of unspecified behavior of breast: Secondary | ICD-10-CM

## 2018-07-31 DIAGNOSIS — D500 Iron deficiency anemia secondary to blood loss (chronic): Secondary | ICD-10-CM

## 2018-07-31 NOTE — Telephone Encounter (Signed)
Oral Oncology Patient Advocate Encounter  Confirmed with [place] that Michaela Little was shipped on 07/30/18 with a $3.90 copay.   [place] Patient [name] Phone [phone] Fax [phone] 07/31/2018   9:50 AM

## 2018-08-04 ENCOUNTER — Encounter: Payer: Self-pay | Admitting: Hematology

## 2018-08-04 ENCOUNTER — Inpatient Hospital Stay (HOSPITAL_BASED_OUTPATIENT_CLINIC_OR_DEPARTMENT_OTHER): Payer: Medicare Other | Admitting: Hematology

## 2018-08-04 ENCOUNTER — Other Ambulatory Visit: Payer: Self-pay

## 2018-08-04 ENCOUNTER — Inpatient Hospital Stay: Payer: Medicare Other

## 2018-08-04 VITALS — BP 139/90 | HR 85 | Temp 98.6°F | Resp 18

## 2018-08-04 DIAGNOSIS — G893 Neoplasm related pain (acute) (chronic): Secondary | ICD-10-CM

## 2018-08-04 DIAGNOSIS — C7951 Secondary malignant neoplasm of bone: Secondary | ICD-10-CM

## 2018-08-04 DIAGNOSIS — C50812 Malignant neoplasm of overlapping sites of left female breast: Secondary | ICD-10-CM

## 2018-08-04 DIAGNOSIS — Z7189 Other specified counseling: Secondary | ICD-10-CM

## 2018-08-04 DIAGNOSIS — C787 Secondary malignant neoplasm of liver and intrahepatic bile duct: Secondary | ICD-10-CM

## 2018-08-04 DIAGNOSIS — D649 Anemia, unspecified: Secondary | ICD-10-CM

## 2018-08-04 DIAGNOSIS — I82402 Acute embolism and thrombosis of unspecified deep veins of left lower extremity: Secondary | ICD-10-CM

## 2018-08-04 DIAGNOSIS — D493 Neoplasm of unspecified behavior of breast: Secondary | ICD-10-CM

## 2018-08-04 DIAGNOSIS — C785 Secondary malignant neoplasm of large intestine and rectum: Secondary | ICD-10-CM | POA: Diagnosis not present

## 2018-08-04 DIAGNOSIS — I4891 Unspecified atrial fibrillation: Secondary | ICD-10-CM

## 2018-08-04 DIAGNOSIS — D500 Iron deficiency anemia secondary to blood loss (chronic): Secondary | ICD-10-CM

## 2018-08-04 DIAGNOSIS — Z170 Estrogen receptor positive status [ER+]: Secondary | ICD-10-CM

## 2018-08-04 DIAGNOSIS — I2699 Other pulmonary embolism without acute cor pulmonale: Secondary | ICD-10-CM

## 2018-08-04 DIAGNOSIS — D630 Anemia in neoplastic disease: Secondary | ICD-10-CM

## 2018-08-04 LAB — CBC WITH DIFFERENTIAL (CANCER CENTER ONLY)
Abs Immature Granulocytes: 0.16 10*3/uL — ABNORMAL HIGH (ref 0.00–0.07)
Basophils Absolute: 0 10*3/uL (ref 0.0–0.1)
Basophils Relative: 1 %
Eosinophils Absolute: 0.1 10*3/uL (ref 0.0–0.5)
Eosinophils Relative: 2 %
HCT: 24.9 % — ABNORMAL LOW (ref 36.0–46.0)
Hemoglobin: 7.1 g/dL — ABNORMAL LOW (ref 12.0–15.0)
Immature Granulocytes: 4 %
Lymphocytes Relative: 22 %
Lymphs Abs: 1 10*3/uL (ref 0.7–4.0)
MCH: 27.6 pg (ref 26.0–34.0)
MCHC: 28.5 g/dL — ABNORMAL LOW (ref 30.0–36.0)
MCV: 96.9 fL (ref 80.0–100.0)
Monocytes Absolute: 0.5 10*3/uL (ref 0.1–1.0)
Monocytes Relative: 11 %
Neutro Abs: 2.7 10*3/uL (ref 1.7–7.7)
Neutrophils Relative %: 60 %
Platelet Count: 141 10*3/uL — ABNORMAL LOW (ref 150–400)
RBC: 2.57 MIL/uL — ABNORMAL LOW (ref 3.87–5.11)
RDW: 18.4 % — ABNORMAL HIGH (ref 11.5–15.5)
WBC Count: 4.4 10*3/uL (ref 4.0–10.5)
nRBC: 6.1 % — ABNORMAL HIGH (ref 0.0–0.2)

## 2018-08-04 LAB — CMP (CANCER CENTER ONLY)
ALT: 37 U/L (ref 0–44)
AST: 88 U/L — ABNORMAL HIGH (ref 15–41)
Albumin: 3 g/dL — ABNORMAL LOW (ref 3.5–5.0)
Alkaline Phosphatase: 431 U/L — ABNORMAL HIGH (ref 38–126)
Anion gap: 10 (ref 5–15)
BUN: 13 mg/dL (ref 6–20)
CO2: 19 mmol/L — ABNORMAL LOW (ref 22–32)
Calcium: 9.5 mg/dL (ref 8.9–10.3)
Chloride: 105 mmol/L (ref 98–111)
Creatinine: 0.9 mg/dL (ref 0.44–1.00)
GFR, Est AFR Am: >60 mL/min (ref >60)
GFR, Estimated: >60 mL/min (ref >60)
Glucose, Bld: 111 mg/dL — ABNORMAL HIGH (ref 70–99)
Potassium: 4.2 mmol/L (ref 3.5–5.1)
Sodium: 134 mmol/L — ABNORMAL LOW (ref 135–145)
Total Bilirubin: 1.5 mg/dL — ABNORMAL HIGH (ref 0.3–1.2)
Total Protein: 7.2 g/dL (ref 6.5–8.1)

## 2018-08-04 LAB — HEMOGLOBIN A1C
Hgb A1c MFr Bld: 4.9 % (ref 4.8–5.6)
Mean Plasma Glucose: 93.93 mg/dL

## 2018-08-04 MED ORDER — CYANOCOBALAMIN 1000 MCG/ML IJ SOLN
INTRAMUSCULAR | Status: AC
  Filled 2018-08-04: qty 1

## 2018-08-04 MED ORDER — DARBEPOETIN ALFA 500 MCG/ML IJ SOSY
PREFILLED_SYRINGE | INTRAMUSCULAR | Status: AC
  Filled 2018-08-04: qty 1

## 2018-08-04 MED ORDER — CYANOCOBALAMIN 1000 MCG/ML IJ SOLN
1000.0000 ug | Freq: Once | INTRAMUSCULAR | Status: AC
  Administered 2018-08-04: 1000 ug via INTRAMUSCULAR

## 2018-08-04 MED ORDER — FULVESTRANT 250 MG/5ML IM SOLN: 500.0000 mg | Freq: Once | INTRAMUSCULAR | Status: DC

## 2018-08-04 MED ORDER — DARBEPOETIN ALFA 500 MCG/ML IJ SOSY
500.0000 ug | PREFILLED_SYRINGE | Freq: Once | INTRAMUSCULAR | Status: AC
  Administered 2018-08-04: 500 ug via SUBCUTANEOUS

## 2018-08-04 MED ORDER — DENOSUMAB 120 MG/1.7ML ~~LOC~~ SOLN
120.0000 mg | Freq: Once | SUBCUTANEOUS | Status: AC
  Administered 2018-08-04: 12:00:00 120 mg via SUBCUTANEOUS

## 2018-08-04 MED ORDER — CYANOCOBALAMIN 1000 MCG/ML IJ SOLN: 1000.0000 ug | Freq: Once | INTRAMUSCULAR | Status: DC

## 2018-08-04 NOTE — Progress Notes (Signed)
Spoke w/ Cheryl/Dr. Burr Medico - patient is to receive aranesp, xgeva, and B12 today. It is okay to give the B12 early today. The patient declined faslodex today - will need to follow up if this is just for today or if she will no longer be getting faslodex in the future.   Demetrius Charity, PharmD, [facility] Pharmacist Pharmacy Phone: [phone] 08/04/2018

## 2018-08-04 NOTE — Progress Notes (Signed)
[facility]   [phone] [phone]   Clinic Follow up Note   Patient Care Team: Ladell Pier, MD as PCP - General (Internal Medicine) [name], MD as Consulting Physician (Pulmonary Disease) [name], MD as Consulting Physician (Hematology) Clent Demark, PA-C as Physician Assistant (Physician Assistant) Berneta Sages, NP as Nurse Practitioner (Adult Health Nurse Practitioner)  Date of Service:  08/04/2018  CHIEF COMPLAINT: F/u of metastatic breast cancer and anemia  SUMMARY OF ONCOLOGIC HISTORY: Oncology History   Cancer Staging Malignant neoplasm of overlapping sites of left breast in female, estrogen receptor positive ([place]) Staging form: Breast, AJCC 7th Edition - Clinical: Stage IIIA (T2, N2, M0) - Unsigned       Malignant neoplasm of overlapping sites of left breast in female, estrogen receptor positive ([place])   05/2008 Cancer Diagnosis    Left sided breast cancer (no path report available)    11/2008 Pathologic Stage    Stage IIIA: T2 N2     Neo-Adjuvant Chemotherapy    Paclitaxel weekly x 12; doxorubicin and cyclophosphamide x 4 - both given with trifiparnib (treated at [facility] in [place])    11/2008 Definitive Surgery    Left modified radiation mastectomy: invasive lobular carcinoma, ER+, PR+, HER2/neu negative, 5/22 LN positive     - 03/2009 Radiation Therapy    Adjuvant radiation to left chest wall    2011 - 08/2014 Anti-estrogen oral therapy    Tamoxifen 20 mg used briefly; discontinued due to uterine lining concerns; changed to anastrozole 1 mg daily (began 07/2100); discontinued by patient summer of 2016    05/26/2015 Progression    Iliac bone biopsy showed metastatic carcinoma consistent with a breast primary, ER positive, PR and HER-2 negative. Her staging CT, and a PET scan was negative for visceral metastasis.    05/2015 - 01/2018 Anti-estrogen oral therapy    1. letrozole and Ibrance started March  2017             -Ibrance dose decreased to 75 mg daily. Stopped in 11/2017 due to anemia   -Letrozole switched to Exemestane on 02/13/17 due to b/l rib pain. She exemestane herself in 01/2018.      11/16/2015 Miscellaneous    Xgeva monthly for bone metastasis     07/03/2016 Imaging    CT chest, abdomen and pelvis with contrast showed no evidence of metastasis.     02/07/2017 Imaging    CT AP W Contrast 02/07/17 IMPRESSION: 1. No CT findings for abdominal/pelvic metastatic disease. 2. No acute abdominal findings, mass lesions or adenopathy. 3. Status post cholecystectomy with mild associated common bile duct dilatation. 4. Somewhat thickened appendix appears relatively stable. No acute inflammatory process. 5. Stable mixed lytic and sclerotic osseous metastatic disease.     02/07/2017 Imaging    Bone Scan Whole Body 02/07/17 IMPRESSION: No scintigraphic evidence skeletal metastasis     08/12/2017 Imaging    Whole Boday Scan 08/12/17 IMPRESSION: Scattered degenerative type uptake as above with a questionable focus of increased tracer localization at L2 vertebral body versus artifact; no abnormality is seen at this site by CT. Consider either characterization by MR or attention on follow-up imaging.    08/12/2017 Imaging    CT AP W Contrast 08/12/17  IMPRESSION: Continued thickening of the appendix is noted without surrounding inflammation. It has maximum measured diameter is decreased compared to prior exam. There is no evidence of acute inflammation. Stable mixed lytic and sclerotic appearance of  visualized skeleton is noted consistent with history of known osseous metastases. No acute abnormality seen in the abdomen or pelvis.    08/22/2017 Surgery    APPENDECTOMY LAPAROSCOPIC ERAS PATHWAY by Dr. Malcolm Metro and REMOVAL OF LEFT TISSUE EXPANDER by Dr. Harlow Mares 08/22/17    08/22/2017 Pathology Results    Diagnosis 08/22/17  1. Appendix, Other than Incidental - METASTATIC  CARCINOMA, CONSISTENT WITH BREAST PRIMARY. - CARCINOMA IS PRESENT AT THE SURGICAL RESECTION MARGIN AS WELL AS THE SEROSAL SURFACE. - LYMPHOVASCULAR INVASION IS IDENTIFIED. - SEE COMMENT. 2. Breast, capsule, Left - DENSE FIBROUS TISSUE WITH MILD INFLAMMATION, INCLUDING A FEW SCATTERED MULTINUCLEATED GIANT CELLS. - THERE IS NO EVIDENCE OF MALIGNANCY.    08/27/2017 Imaging    CT AP W Contrast 08/27/17 IMPRESSION: No evidence of pulmonary embolus. Small pericardial effusion with uncertain clinical significance. No evidence of acute abnormalities within the chest, abdomen or pelvis. Area of fat stranding and small focus of gas within the anterior abdominal wall, immediately inferior to the umbilicus, probably at the site of laparoscopic access.    10/07/2017 Echocardiogram    10/07/2017 ECO Study Conclusions  - Procedure narrative: Transthoracic echocardiography. Image   quality was adequate. The study was technically difficult, as a   result of poor acoustic windows. - Left ventricle: The cavity size was normal. Wall thickness was   normal. Systolic function was normal. The estimated ejection   fraction was in the range of 60% to 65%. Wall motion was normal;   there were no regional wall motion abnormalities. Features are   consistent with a pseudonormal left ventricular filling pattern,   with concomitant abnormal relaxation and increased filling   pressure (grade 2 diastolic dysfunction).    01/16/2018 PET scan    01/16/2018 PET Scan IMPRESSION: 1. Most striking finding is intense marrow activity diffusely throughout the axillary and appendicular skeleton. This is favored a physiologic marrow response from chronic anemia rather than diffuse metastatic disease. 2. Several discrete foci of more intense activity noted in the spine which could indicate malignancy or trauma. Difficult to define malignancy on the background of diffuse marrow activity. 3. Diffuse sclerosis throughout  the bones similar to a recent CT scans but new from remote PET-CT scan 03/01/2016. 4. No evidence soft tissue metastasis.     04/02/2018 Imaging    CT Angio CAP 04/02/18  IMPRESSION: 1. Multiple new hepatic lesions highly concerning for developing metastatic disease to the liver. This could be confirmed with nonemergent MRI of the abdomen with and without IV gadolinium if clinically appropriate. 2. Widespread metastatic disease to the bones also appears progressive compared to prior examinations. 3. Large right-sided pelvic deep venous thrombosis extending from the right common femoral vein to the proximal right external iliac vein. IVC filter is in position. No clot identified in the IVC filter at this time. No pulmonary embolism on today's examination. 4. Aortic atherosclerosis. 5. Additional incidental findings, as above.    05/01/2018 -  Anti-estrogen oral therapy    Fulvestrant injections 567m monthly starting 05/01/18 -added oral Piqray 3032mdaily starting this week    06/30/2018 -  Antibody Plan    Monthly Xgeva restarted 06/30/18     07/25/2018 PET scan    PET  IMPRESSION: 1. Marked progression of metastatic disease with extensive tumor burden in the skeleton and liver. Multifocal intramuscular metastatic lesions in the paraspinal musculature and pelvic musculature. 2. The bony lesions include a lytic lesion of the left side of the clivus/skull base. 3. Mild  to moderate right hydronephrosis and delayed renal excretion due to suspected tumor along the right renal pelvis. There is also suspected right perirenal tumor posteriorly, and a suspected small metastatic lesion to the left adrenal gland. 4. Other imaging findings of potential clinical significance: Aortic Atherosclerosis (ICD10-I70.0). Mild cardiomegaly. An IVC filter is in place.      CURRENT THERAPY:  -Aranesp injection every 2 weeks -B12 injectionsmonthly  -Lovenox '120mg'$  q12h, changed to '100mg'$  every 12 hours  in early Feb 2020 at home (adjusted based on her weight) -Monthlyfulvestrant injection'500mg'$  starting on 2/8/20with loading dose for first month. Monthly starting 06/27/18.  --Pending Piqray '300mg'$  daily -Monthly Xgeva restarted 06/30/18   INTERVAL HISTORY:  Michaela Little is here for a follow up and injection. I saw her last week but she did not get injections and it was rescheduled to today.  She has received Piqray but has been off started.  I saw her before the injection discussed starting metformin before she starts Piqray due to the high risk of severe hypoglycemia.  She feels about the same, still has body aches, low appetite, no significant change from last week  REVIEW OF SYSTEMS:   Constitutional: Denies fevers, chills or abnormal weight loss (+) headaches  Eyes: Denies blurriness of vision Ears, nose, mouth, throat, and face: Denies mucositis or sore throat Respiratory: Denies cough or wheezes (+) SOB Cardiovascular: Denies palpitation, chest discomfort (+) Very mild  lower extremity swelling Gastrointestinal:  Denies nausea, heartburn or change in bowel habits (+) abdominal gas (+) Right flank pain with eating Skin: Denies abnormal skin rashes (+) Skin nodules on her back stable.  Lymphatics: Denies new lymphadenopathy or easy bruising Neurological:Denies numbness, tingling or new weaknesses Behavioral/Psych: Mood is stable, no new changes  All other systems were reviewed with the patient and are negative.  MEDICAL HISTORY:  Past Medical History:  Diagnosis Date  . Anxiety   . Arthritis    hands, knees, hips  . Asthma   . Bronchitis   . Cancer ([place])   . Cervical stenosis (uterine cervix)   . Disorder of appendix    Enlarged  . Dyspnea on exertion   . GERD (gastroesophageal reflux disease)   . Headache(784.0)   . History of breast cancer    2010--  LEFT  s/p  mastectomy (in [place]) AND CHEMORADIATION--  NO RECURRENCE  . History of cervical dysplasia   . Hyperlipidemia    . Hypertension   . Malignant neoplasm of overlapping sites of left breast in female, estrogen receptor positive ([place]) 03/05/2013  . OSA (obstructive sleep apnea) moderate osa per study  09/2010   CPAP  , NOT USING ON REGULAR BASIS  . Pelvic pain in female   . Positive H. pylori test    08-05-2013  . Positive TB test    [place] TE[place]--  [place]/ MEDS  . Refusal of blood transfusions as patient is Jehovah's Witness   . Seasonal allergies   . Uterine fibroid   . Wears glasses     SURGICAL HISTORY: Past Surgical History:  Procedure Laterality Date  . APPENDECTOMY  y  . CARDIOVASCULAR STRESS TEST  10-29-2012   low risk perfusion study/  no significant reversibity/ ef 66%/  normal wall motion  . CERVICAL CONIZATION W/BX  2012   in  [name] N/A 05/14/2012   Procedure: LAPAROSCOPIC CHOLECYSTECTOMY;  Surgeon: Ralene Ok, MD;  Location: [facility];  Service: General;  Laterality: N/A;  . COLONOSCOPY  2011  normal per patient - [place]  . DILATION AND CURETTAGE OF UTERUS  10/15/2011   Procedure: DILATATION AND CURETTAGE;  Surgeon: Melina Schools, MD;  Location: [facility];  Service: Gynecology;  Laterality: N/A;  Conization &  endocervical curettings  . EXAMINATION UNDER ANESTHESIA N/A 08/20/2013   Procedure: EXAM UNDER ANESTHESIA;  Surgeon: Margarette Asal, MD;  Location: [facility];  Service: Gynecology;  Laterality: N/A;  . [place]   right  . IR IVC FILTER PLMT / S&I /IMG GUID/MOD SED  03/01/2018  . IR RADIOLOGIST EVAL & MGMT  04/08/2018  . LAPAROSCOPIC APPENDECTOMY N/A 08/22/2017   Procedure: [place];  Surgeon: Coralie Keens, MD;  Location: [facility];  Service: General;  Laterality: N/A;  . LAPAROSCOPIC ASSISTED VAGINAL HYSTERECTOMY N/A 10/26/2014   Procedure: HYSTERECTOMY ABDOMINAL ;  Surgeon: Molli Posey, MD;  Location: [facility];  Service: Gynecology;  Laterality: N/A;  . MASTECTOMY Left 11/2008  in [place]   .  SALPINGOOPHORECTOMY Bilateral 10/26/2014   Procedure: BILATERAL SALPINGO OOPHORECTOMY;  Surgeon: Molli Posey, MD;  Location: [facility];  Service: Gynecology;  Laterality: Bilateral;  . TISSUE EXPANDER PLACEMENT Left 08/22/2017   Procedure: REMOVAL OF LEFT TISSUE EXPANDER;  Surgeon: Crissie Reese, MD;  Location: [facility];  Service: Plastics;  Laterality: Left;  . TISSUE EXPANDER REMOVAL Left 08/22/2017  . TRANSTHORACIC ECHOCARDIOGRAM  10-29-2012   mild lvh/  ef 60-65%/  grade II diastolic dysfunction/  trivial mr  &  tr    I have reviewed the social history and family history with the patient and they are unchanged from previous note.  ALLERGIES:  is allergic to lisinopril-hydrochlorothiazide; adhesive [tape]; fentanyl; gabapentin; hctz [hydrochlorothiazide]; latex; lisinopril; losartan potassium; other; oxycodone; and tums [calcium carbonate antacid].  MEDICATIONS:  Current Outpatient Medications  Medication Sig Dispense Refill  . acetaminophen (TYLENOL) 500 MG tablet Take 500 mg by mouth every 6 (six) hours as needed for mild pain.    Marland Kitchen alpelisib (PIQRAY, 300 MG DAILY DOSE,) 2 x 150 MG Therapy Pack Take two '150mg'$  tablets with food at the same time daily. Swallow whole, do not crush, chew, or split. 60 each 2  . carvedilol (COREG) 12.5 MG tablet Take 1 tablet (12.5 mg total) by mouth 2 (two) times daily with a meal. 60 tablet 3  . cetirizine (ZYRTEC) 10 MG tablet Take 10 mg by mouth daily as needed for allergies or rhinitis.   11  . docusate sodium (COLACE) 100 MG capsule Take 1 capsule (100 mg total) by mouth daily as needed for mild constipation. (Patient taking differently: Take 200 mg by mouth every other day. ) 10 capsule 0  . enoxaparin (LOVENOX) 100 MG/ML injection 100 mg 2 (two) times daily.    . feeding supplement, ENSURE ENLIVE, (ENSURE ENLIVE) LIQD Take 237 mLs by mouth 2 (two) times daily between meals. (Patient taking differently: Take 237 mLs by mouth See admin instructions. Drink 237  ml's by mouth two to three times a day (Non-dairy Ensure)) 237 mL 12  . fluticasone (FLONASE) 50 MCG/ACT nasal spray Place 1 spray into both nostrils daily.  2  . furosemide (LASIX) 20 MG tablet Take 20 mg by mouth daily as needed for fluid.    . hydrALAZINE (APRESOLINE) 10 MG tablet Take 1 tablet (10 mg total) by mouth 3 (three) times daily. 90 tablet 0  . ipratropium-albuterol (DUONEB) 0.5-2.5 (3) MG/3ML SOLN INHALE THREE MLS VIA NEBULIZER EVERY  6 HOURS AS NEEDED 360 mL 5  . ondansetron (ZOFRAN) 8 MG tablet Take 1 tablet (8 mg total) by mouth 2 (two) times daily. 60 tablet 1  . traMADol (ULTRAM) 50 MG tablet Take 2 tablets (100 mg total) by mouth every 6 (six) hours. (Patient taking differently: Take 50 mg by mouth every 6 (six) hours as needed for moderate pain or severe pain. ) 30 tablet 0   No current facility-administered medications for this visit.     PHYSICAL EXAMINATION: ECOG PERFORMANCE STATUS: 3 - Symptomatic, >50% confined to bed GENERAL:alert, no distress and comfortable SKIN: skin color, texture, turgor are normal, no rashes or significant lesions EYES: normal, Conjunctiva are pink and non-injected, sclera clear OROPHARYNX:no exudate, no erythema and lips, buccal mucosa, and tongue normal  NECK: supple, thyroid normal size, non-tender, without nodularity LYMPH:  no palpable lymphadenopathy in the cervical, axillary or inguinal LUNGS: clear to auscultation and percussion with normal breathing effort HEART: regular rate & rhythm and no murmurs and no lower extremity edema ABDOMEN:abdomen soft, non-tender and normal bowel sounds Musculoskeletal:no cyanosis of digits and no clubbing  NEURO: alert & oriented x 3 with fluent speech, no focal motor/sensory deficits  LABORATORY DATA:  I have reviewed the data as listed CBC Latest Ref Rng & Units 08/04/2018 07/28/2018 07/14/2018  WBC 4.0 - 10.5 K/uL 4.4 4.1 4.0  Hemoglobin 12.0 - 15.0 g/dL 7.1(L) 6.9(LL) 6.0(LL)  Hematocrit 36.0 -  46.0 % 24.9(L) 24.9(L) 20.6(L)  Platelets 150 - 400 K/uL 141(L) 143(L) 124(L)     CMP Latest Ref Rng & Units 08/04/2018 06/30/2018 06/13/2018  Glucose 70 - 99 mg/dL 111(H) 124(H) 107(H)  BUN 6 - 20 mg/dL '13 17 10  '$ Creatinine 0.44 - 1.00 mg/dL 0.90 1.13(H) 0.97  Sodium 135 - 145 mmol/L 134(L) 137 139  Potassium 3.5 - 5.1 mmol/L 4.2 3.8 4.0  Chloride 98 - 111 mmol/L 105 105 106  CO2 22 - 32 mmol/L 19(L) 21(L) 22  Calcium 8.9 - 10.3 mg/dL 9.5 12.5(H) 9.6  Total Protein 6.5 - 8.1 g/dL 7.2 7.6 6.9  Total Bilirubin 0.3 - 1.2 mg/dL 1.5(H) 1.2 1.0  Alkaline Phos 38 - 126 U/L 431(H) 213(H) 174(H)  AST 15 - 41 U/L 88(H) 54(H) 33  ALT 0 - 44 U/L 37 21 15      RADIOGRAPHIC STUDIES: I have personally reviewed the radiological images as listed and agreed with the findings in the report. No results found.   ASSESSMENT & PLAN:  Michaela Little is a 58 y.o. female with   1. 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 -Unfortunately she developed bone metastasis 1 year after she stoppinganastrozole.She was treated withAI and Ibrance.Ibrance wasstopped in 11/2017 due to severe anemia. She also stoppedexemestane in late November 2019 herself.  -Due to her significant disease progression on fulvestrant, I recommend her to add Piqray  -I again reviewed potential side effects from [facility], especially hypoglycemia, which could be severe.  Patient is not diabetic, we had to be A1c checked today.  I recommend her to consider starting metformin before she starts [place].  Potential benefit effects of metformin discussed with patient, she declined -She became very frustrated and upset when I reviewed the side effects of Piqray, and she wants to hold it for now, and make a decision in the next two weeks -We also discussed  decides not  to take [place], she states she has a living will which needs [employer], but she has not decided about DNR -We again discussed the overall poor prognosis, with life expectancy 2-4 months  -I spoke with her daughter on the phone during her visit  2.Bone metastasis, hypercalcemiasecondary to her malignancy -She was previously on Xgeva, stopped when she developed a severe anemia. She received a Zometa on1/21/2020 for severe hypercalcemiaand again on 06/03/18. -Restarted monthly Xgeva on 06/30/18. Next injection today  -Continue to monitorrenal function andhypercalcemia.  3.Severe anemia  -She developed worsening anemia in September 2019, lowest hemoglobin 3.9, likely secondary to [place] and diffuse bone metastasis  -She is a Jehovah witness, refuses blood transfusion -She has been on Aranesp injection since then, anemia slowly improving. Will continue '500mg'$  injections every 2 weeks, -will continue B12 injection monthly -She has not been able to tolerate oral iron due to constipation and nausea so I d/c.  -HG at 7.1today (07/28/18). Proceed with Aranesptoday and continue B12injectionsmonthly   4. Right LE DVT, Recent PE -She was advised to restarted Lovenox '80mg'$ , but stopped on her own due to the concerns of bleeding -She was hospitalized on 02/27/18 for multiple PE. This is likely related to metastatic cancer, immobility and Aranesp injections.Patient wishes to continue Aranesp injection despite the risk of thrombosis. Nunzio Cory repeatedlydeclined thrombectomy and Angovac which was offeredby IR Dr. Geroge Baseman  -Her ankle swelling hadsignificantly decreased but still has swelling in herrightknee and upper leg. Stable.  -Due to her recent weight loss.She iscurrentlyon Lovenox 100 mg every 12 hours at home, continue.  5. Cough and Dyspnea -f/u with pulmonologist Dr. Melvyn Novas -on Flovent,Cough improved with Hycodan, continue -3/2020CT Chestshownormal lung.  -Not currently on Continuous O2 canula, 2L -Coughhas resolved. Has stable SOB.   6. Body pain -She has been having bilateral rib cage pain from her aggressive coughing and probably related to her bone metastasis -Take tramadol as neededonce a day.She does not feel she needsstrongerpain medication.  -She feels she may have another rib fracture from her aggressive coughing. Will monitor. Improving.  -Pain is now mainly intermittent from liver mets and abdominal gas.   7. Afib -Since last visit she was hospitalized on 05/30/18 for atrial fibrillation  -She was treated with Cardizem and discharged with coreg. Continue  -She was recently in nursing home rehab.   8. Goal of care discussion -Weagain discussed the incurable nature of her cancer. She has had significant disease progression and the overall prognosis is very poor, given very limited treatment options -The patient understands the goal of care is palliative. -I recommend DNR/DNI and again encourage her to consider and discuss with her children, she will think about this. She does has a living will and POA in place.     Plan -Pt declined metformin and wants to hold on starting [place] for now  -Labs reviewed and adequate to proceed with B12, Xgeva and Aranesp today, will hold on fulvestrant injection today  -lab, f/u and injection in 2 weeks    No problem-specific Assessment & Plan notes found for this encounter.   No orders of the defined types were placed in this encounter.  All questions were answered. The patient knows to call the clinic with any problems, questions or concerns. No barriers to learning was detected. I spent 20 minutes counseling the patient face to face. The total time spent in the appointment was 25 minutes and more than 50% was on counseling and review of test results  [name], MD 08/04/2018   I, Joslyn Devon, am acting as scribe for [name], MD.   I have reviewed the above  documentation for accuracy and completeness, and I agree with the above.

## 2018-08-04 NOTE — Patient Instructions (Addendum)
Denosumab injection What is this medicine? DENOSUMAB (den oh sue mab) slows bone breakdown. Prolia is used to treat osteoporosis in women after menopause and in men, and in people who are taking corticosteroids for 6 months or more. Delton See is used to treat a high calcium level due to cancer and to prevent bone fractures and other bone problems caused by multiple myeloma or cancer bone metastases. Delton See is also used to treat giant cell tumor of the bone. This medicine may be used for other purposes; ask your health care provider or pharmacist if you have questions. COMMON BRAND NAME(S): Prolia, XGEVA What should I tell my health care provider before I take this medicine? They need to know if you have any of these conditions: -dental disease -having surgery or tooth extraction -infection -kidney disease -low levels of calcium or Vitamin D in the blood -malnutrition -on hemodialysis -skin conditions or sensitivity -thyroid or parathyroid disease -an unusual reaction to denosumab, other medicines, foods, dyes, or preservatives -pregnant or trying to get pregnant -breast-feeding How should I use this medicine? This medicine is for injection under the skin. It is given by a health care professional in a hospital or clinic setting. A special MedGuide will be given to you before each treatment. Be sure to read this information carefully each time. For Prolia, talk to your pediatrician regarding the use of this medicine in children. Special care may be needed. For Delton See, talk to your pediatrician regarding the use of this medicine in children. While this drug may be prescribed for children as young as 13 years for selected conditions, precautions do apply. Overdosage: If you think you have taken too much of this medicine contact a poison control center or emergency room at once. NOTE: This medicine is only for you. Do not share this medicine with others. What if I miss a dose? It is important not to  miss your dose. Call your doctor or health care professional if you are unable to keep an appointment. What may interact with this medicine? Do not take this medicine with any of the following medications: -other medicines containing denosumab This medicine may also interact with the following medications: -medicines that lower your chance of fighting infection -steroid medicines like prednisone or cortisone This list may not describe all possible interactions. Give your health care provider a list of all the medicines, herbs, non-prescription drugs, or dietary supplements you use. Also tell them if you smoke, drink alcohol, or use illegal drugs. Some items may interact with your medicine. What should I watch for while using this medicine? Visit your doctor or health care professional for regular checks on your progress. Your doctor or health care professional may order blood tests and other tests to see how you are doing. Call your doctor or health care professional for advice if you get a fever, chills or sore throat, or other symptoms of a cold or flu. Do not treat yourself. This drug may decrease your body's ability to fight infection. Try to avoid being around people who are sick. You should make sure you get enough calcium and vitamin D while you are taking this medicine, unless your doctor tells you not to. Discuss the foods you eat and the vitamins you take with your health care professional. See your dentist regularly. Brush and floss your teeth as directed. Before you have any dental work done, tell your dentist you are receiving this medicine. Do not become pregnant while taking this medicine or for 5 months  after stopping it. Talk with your doctor or health care professional about your birth control options while taking this medicine. Women should inform their doctor if they wish to become pregnant or think they might be pregnant. There is a potential for serious side effects to an unborn  child. Talk to your health care professional or pharmacist for more information. What side effects may I notice from receiving this medicine? Side effects that you should report to your doctor or health care professional as soon as possible: -allergic reactions like skin rash, itching or hives, swelling of the face, lips, or tongue -bone pain -breathing problems -dizziness -jaw pain, especially after dental work -redness, blistering, peeling of the skin -signs and symptoms of infection like fever or chills; cough; sore throat; pain or trouble passing urine -signs of low calcium like fast heartbeat, muscle cramps or muscle pain; pain, tingling, numbness in the hands or feet; seizures -unusual bleeding or bruising -unusually weak or tired Side effects that usually do not require medical attention (report to your doctor or health care professional if they continue or are bothersome): -constipation -diarrhea -headache -joint pain -loss of appetite -muscle pain -runny nose -tiredness -upset stomach This list may not describe all possible side effects. Call your doctor for medical advice about side effects. You may report side effects to FDA at 1-800-FDA-1088. Where should I keep my medicine? This medicine is only given in a clinic, doctor's office, or other health care setting and will not be stored at home. NOTE: This sheet is a summary. It may not cover all possible information. If you have questions about this medicine, talk to your doctor, pharmacist, or health care provider.  2019 [employer] Standard (2017-07-19 16:10:44) Cyanocobalamin, Vitamin B12 injection What is this medicine? CYANOCOBALAMIN (sye an oh koe BAL a min) is a man made form of vitamin B12. Vitamin B12 is used in the growth of healthy blood cells, nerve cells, and proteins in the body. It also helps with the metabolism of fats and carbohydrates. This medicine is used to treat people who can not absorb vitamin  B12. This medicine may be used for other purposes; ask your health care provider or pharmacist if you have questions. COMMON BRAND NAME(S): B-12 Compliance Kit, B-12 Injection Kit, Cyomin, LA-12, Nutri-Twelve, Physicians EZ Use B-12, Primabalt What should I tell my health care provider before I take this medicine? They need to know if you have any of these conditions: -kidney disease -Leber's disease -megaloblastic anemia -an unusual or allergic reaction to cyanocobalamin, cobalt, other medicines, foods, dyes, or preservatives -pregnant or trying to get pregnant -breast-feeding How should I use this medicine? This medicine is injected into a muscle or deeply under the skin. It is usually given by a health care professional in a clinic or doctor's office. However, your doctor may teach you how to inject yourself. Follow all instructions. Talk to your pediatrician regarding the use of this medicine in children. Special care may be needed. Overdosage: If you think you have taken too much of this medicine contact a poison control center or emergency room at once. NOTE: This medicine is only for you. Do not share this medicine with others. What if I miss a dose? If you are given your dose at a clinic or doctor's office, call to reschedule your appointment. If you give your own injections and you miss a dose, take it as soon as you can. If it is almost time for your next dose, take only that dose.  Do not take double or extra doses. What may interact with this medicine? -colchicine -heavy alcohol intake This list may not describe all possible interactions. Give your health care provider a list of all the medicines, herbs, non-prescription drugs, or dietary supplements you use. Also tell them if you smoke, drink alcohol, or use illegal drugs. Some items may interact with your medicine. What should I watch for while using this medicine? Visit your doctor or health care professional regularly. You may  need blood work done while you are taking this medicine. You may need to follow a special diet. Talk to your doctor. Limit your alcohol intake and avoid smoking to get the best benefit. What side effects may I notice from receiving this medicine? Side effects that you should report to your doctor or health care professional as soon as possible: -allergic reactions like skin rash, itching or hives, swelling of the face, lips, or tongue -blue tint to skin -chest tightness, pain -difficulty breathing, wheezing -dizziness -red, swollen painful area on the leg Side effects that usually do not require medical attention (report to your doctor or health care professional if they continue or are bothersome): -diarrhea -headache This list may not describe all possible side effects. Call your doctor for medical advice about side effects. You may report side effects to FDA at 1-800-FDA-1088. Where should I keep my medicine? Keep out of the reach of children. Store at room temperature between 15 and 30 degrees C (59 and 85 degrees F). Protect from light. Throw away any unused medicine after the expiration date. NOTE: This sheet is a summary. It may not cover all possible information. If you have questions about this medicine, talk to your doctor, pharmacist, or health care provider.  2019 [employer]/Gold Standard (2007-06-23 22:10:20) Darbepoetin Alfa injection What is this medicine? DARBEPOETIN ALFA (dar be POE e tin AL fa) helps your body make more red blood cells. It is used to treat anemia caused by chronic kidney failure and chemotherapy. This medicine may be used for other purposes; ask your health care provider or pharmacist if you have questions. COMMON BRAND NAME(S): Aranesp What should I tell my health care provider before I take this medicine? They need to know if you have any of these conditions: -blood clotting disorders or history of blood clots -cancer patient not on chemotherapy -cystic  fibrosis -heart disease, such as angina, heart failure, or a history of a heart attack -hemoglobin level of 12 g/dL or greater -high blood pressure -low levels of folate, iron, or vitamin B12 -seizures -an unusual or allergic reaction to darbepoetin, erythropoietin, albumin, hamster proteins, latex, other medicines, foods, dyes, or preservatives -pregnant or trying to get pregnant -breast-feeding How should I use this medicine? This medicine is for injection into a vein or under the skin. It is usually given by a health care professional in a hospital or clinic setting. If you get this medicine at home, you will be taught how to prepare and give this medicine. Use exactly as directed. Take your medicine at regular intervals. Do not take your medicine more often than directed. It is important that you put your used needles and syringes in a special sharps container. Do not put them in a trash can. If you do not have a sharps container, call your pharmacist or healthcare provider to get one. A special MedGuide will be given to you by the pharmacist with each prescription and refill. Be sure to read this information carefully each time. Talk to  your pediatrician regarding the use of this medicine in children. While this medicine may be used in children as young as 54 month of age for selected conditions, precautions do apply. Overdosage: If you think you have taken too much of this medicine contact a poison control center or emergency room at once. NOTE: This medicine is only for you. Do not share this medicine with others. What if I miss a dose? If you miss a dose, take it as soon as you can. If it is almost time for your next dose, take only that dose. Do not take double or extra doses. What may interact with this medicine? Do not take this medicine with any of the following medications: -epoetin alfa This list may not describe all possible interactions. Give your health care provider a list of  all the medicines, herbs, non-prescription drugs, or dietary supplements you use. Also tell them if you smoke, drink alcohol, or use illegal drugs. Some items may interact with your medicine. What should I watch for while using this medicine? Your condition will be monitored carefully while you are receiving this medicine. You may need blood work done while you are taking this medicine. This medicine may cause a decrease in vitamin B6. You should make sure that you get enough vitamin B6 while you are taking this medicine. Discuss the foods you eat and the vitamins you take with your health care professional. What side effects may I notice from receiving this medicine? Side effects that you should report to your doctor or health care professional as soon as possible: -allergic reactions like skin rash, itching or hives, swelling of the face, lips, or tongue -breathing problems -changes in vision -chest pain -confusion, trouble speaking or understanding -feeling faint or lightheaded, falls -high blood pressure -muscle aches or pains -pain, swelling, warmth in the leg -rapid weight gain -severe headaches -sudden numbness or weakness of the face, arm or leg -trouble walking, dizziness, loss of balance or coordination -seizures (convulsions) -swelling of the ankles, feet, hands -unusually weak or tired Side effects that usually do not require medical attention (report to your doctor or health care professional if they continue or are bothersome): -diarrhea -fever, chills (flu-like symptoms) -headaches -nausea, vomiting -redness, stinging, or swelling at site where injected This list may not describe all possible side effects. Call your doctor for medical advice about side effects. You may report side effects to FDA at 1-800-FDA-1088. Where should I keep my medicine? Keep out of the reach of children. Store in a refrigerator between 2 and 8 degrees C (36 and 46 degrees F). Do not freeze. Do  not shake. Throw away any unused portion if using a single-dose vial. Throw away any unused medicine after the expiration date. NOTE: This sheet is a summary. It may not cover all possible information. If you have questions about this medicine, talk to your doctor, pharmacist, or health care provider.  2019 [employer]vier/[employer] Standard (2017-03-27 16:44:20)

## 2018-08-05 ENCOUNTER — Telehealth: Payer: Self-pay | Admitting: Hematology

## 2018-08-05 LAB — CANCER ANTIGEN 27.29: CA 27.29: 2010.4 U/mL — ABNORMAL HIGH (ref 0.0–38.6)

## 2018-08-05 NOTE — Telephone Encounter (Signed)
No los per 5/11.

## 2018-08-06 ENCOUNTER — Telehealth: Payer: Self-pay | Admitting: Hematology

## 2018-08-06 NOTE — Telephone Encounter (Signed)
LB off AM 5/18. Appointments moved to PM. Called patient re new time and per patient she cannot come in until after 5/25. Patient already on schedule for f/u and injection 6/1 and wants to keep that appointment date. Cancelled 5/18 appointments and moved 5/18 lab to 6/1 with fu/injection. Also per patient she cannot come early AM. F/u 6/1 moved from [facility] to [facility] and appointments moved to PM. Patient has new appointment d/t for 6/1. Message routed to [facility]F/[facility].

## 2018-08-08 ENCOUNTER — Telehealth: Payer: Self-pay | Admitting: Internal Medicine

## 2018-08-08 NOTE — Telephone Encounter (Signed)
malarie with [facility] called to extend home health for OT 1 week 4   Would like to extend home health aid for 2 week for 4 weeks.   Please follow up.

## 2018-08-11 ENCOUNTER — Ambulatory Visit: Payer: Medicare Other

## 2018-08-11 ENCOUNTER — Other Ambulatory Visit: Payer: Medicare Other

## 2018-08-11 ENCOUNTER — Ambulatory Visit: Payer: Medicare Other | Admitting: Nurse Practitioner

## 2018-08-11 NOTE — Telephone Encounter (Signed)
Contacted [name] from [facility] and left a vm giving her verbal orders and if she has any questions or concerns to give me a call

## 2018-08-14 ENCOUNTER — Telehealth: Payer: Self-pay | Admitting: Obstetrics and Gynecology

## 2018-08-14 ENCOUNTER — Other Ambulatory Visit: Payer: Self-pay | Admitting: Pharmacist

## 2018-08-14 MED ORDER — CARVEDILOL 12.5 MG PO TABS: 12.5000 mg | ORAL_TABLET | Freq: Two times a day (BID) | ORAL | 2 refills | Status: DC

## 2018-08-14 NOTE — Telephone Encounter (Signed)
Called the patient to confirm upcoming appointment. Left a detailed message with the office location, date and time.

## 2018-08-15 ENCOUNTER — Other Ambulatory Visit: Payer: Self-pay

## 2018-08-15 ENCOUNTER — Encounter: Payer: Self-pay | Admitting: Obstetrics & Gynecology

## 2018-08-15 ENCOUNTER — Ambulatory Visit (INDEPENDENT_AMBULATORY_CARE_PROVIDER_SITE_OTHER): Payer: Medicare Other | Admitting: Obstetrics & Gynecology

## 2018-08-15 VITALS — BP 107/64 | HR 100 | Ht 67.0 in | Wt 192.0 lb

## 2018-08-15 DIAGNOSIS — C50919 Malignant neoplasm of unspecified site of unspecified female breast: Secondary | ICD-10-CM

## 2018-08-15 DIAGNOSIS — N9089 Other specified noninflammatory disorders of vulva and perineum: Secondary | ICD-10-CM

## 2018-08-15 MED ORDER — DOXYCYCLINE HYCLATE 100 MG PO CAPS: 100.0000 mg | ORAL_CAPSULE | Freq: Two times a day (BID) | ORAL | 0 refills | Status: DC

## 2018-08-15 NOTE — Patient Instructions (Signed)
Return to clinic for any scheduled appointments or for any gynecologic concerns as needed.   

## 2018-08-15 NOTE — Progress Notes (Signed)
GYNECOLOGY OFFICE VISIT NOTE  History:   Michaela Little is a 58 y.o. [id number] with history of metastatic brast cancer, TAH/BSO for CIN I and a myriad of other issues here today for evaluation of vuvlar lesion.  Lesion was noted a few months ago, patient feels it arose after having a foley catheter in place for a long time.  Reports feeling multiple lesions, that are mildly painful. No drainage, no bleeding.  Not willing to undergo any biopsy today, not mentally ready for that today. She denies any abnormal vaginal discharge, bleeding, pelvic pain or other gynecologic concerns.    Past Medical History:  Diagnosis Date  . Anxiety   . Arthritis    hands, knees, hips  . Asthma   . Bronchitis   . Cancer ([place])   . Cervical stenosis (uterine cervix)   . Disorder of appendix    Enlarged  . Dyspnea on exertion   . GERD (gastroesophageal reflux disease)   . Headache(784.0)   . History of breast cancer    2010--  LEFT  s/p  mastectomy (in [place]) AND CHEMORADIATION--  NO RECURRENCE  . History of cervical dysplasia   . Hyperlipidemia   . Hypertension   . Malignant neoplasm of overlapping sites of left breast in female, estrogen receptor positive ([place]) 03/05/2013  . OSA (obstructive sleep apnea) moderate osa per study  09/2010   CPAP  , NOT USING ON REGULAR BASIS  . Pelvic pain in female   . Positive H. pylori test    08-05-2013  . Positive TB test    [place] TE[place]--  [place] W/ MEDS  . Refusal of blood transfusions as patient is Jehovah's Witness   . Seasonal allergies   . Uterine fibroid   . Wears glasses     Past Surgical History:  Procedure Laterality Date  . APPENDECTOMY  y  . CARDIOVASCULAR STRESS TEST  10-29-2012   low risk perfusion study/  no significant reversibity/ ef 66%/  normal wall motion  . CERVICAL CONIZATION W/BX  2012   in  [place] N/A 05/14/2012   Procedure: LAPAROSCOPIC CHOLECYSTECTOMY;  Surgeon: Ralene Ok, MD;  Location: [facility];  Service: General;   Laterality: N/A;  . COLONOSCOPY  2011   normal per patient - [place]  . DILATION AND CURETTAGE OF UTERUS  10/15/2011   Procedure: DILATATION AND CURETTAGE;  Surgeon: Melina Schools, MD;  Location: [facility];  Service: Gynecology;  Laterality: N/A;  Conization &  endocervical curettings  . EXAMINATION UNDER ANESTHESIA N/A 08/20/2013   Procedure: EXAM UNDER ANESTHESIA;  Surgeon: Margarette Asal, MD;  Location: [facility];  Service: Gynecology;  Laterality: N/A;  . [place]   right  . IR IVC FILTER PLMT / S&I /IMG GUID/MOD SED  03/01/2018  . IR RADIOLOGIST EVAL & MGMT  04/08/2018  . LAPAROSCOPIC APPENDECTOMY N/A 08/22/2017   Procedure: [place];  Surgeon: Coralie Keens, MD;  Location: [facility];  Service: General;  Laterality: N/A;  . LAPAROSCOPIC ASSISTED VAGINAL HYSTERECTOMY N/A 10/26/2014   Procedure: HYSTERECTOMY ABDOMINAL ;  Surgeon: Molli Posey, MD;  Location: [facility];  Service: Gynecology;  Laterality: N/A;  . MASTECTOMY Left 11/2008  in [place]   . SALPINGOOPHORECTOMY Bilateral 10/26/2014   Procedure: BILATERAL SALPINGO OOPHORECTOMY;  Surgeon: Molli Posey, MD;  Location: [facility];  Service: Gynecology;  Laterality: Bilateral;  . TISSUE EXPANDER PLACEMENT Left 08/22/2017  Procedure: REMOVAL OF LEFT TISSUE EXPANDER;  Surgeon: Crissie Reese, MD;  Location: [facility];  Service: Plastics;  Laterality: Left;  . TISSUE EXPANDER REMOVAL Left 08/22/2017  . TRANSTHORACIC ECHOCARDIOGRAM  10-29-2012   mild lvh/  ef 60-65%/  grade II diastolic dysfunction/  trivial mr  &  tr    The following portions of the patient's history were reviewed and updated as appropriate: allergies, current medications, past family history, past medical history, past social history, past surgical history and problem list.   Health Maintenance:  Normal pap and negative HRHPV on 09/13/2016.   Review of Systems:  Pertinent items noted in HPI and remainder of  comprehensive ROS otherwise negative.  Physical Exam:  BP 107/64   Pulse 100   Ht 5' 7"  (1.702 m)   Wt 192 lb (87.1 kg)   LMP 02/25/2009   BMI 30.07 kg/m  CONSTITUTIONAL: Well-developed, well-nourished female in no acute distress.  HEENT:  Normocephalic, atraumatic. External right and left ear normal. No scleral icterus.  NECK: Normal range of motion, supple, no masses noted on observation SKIN: No rash noted. Not diaphoretic. No erythema. No pallor. MUSCULOSKELETAL: Normal range of motion. No edema noted. NEUROLOGIC: Alert and oriented to person, place, and time. Normal muscle tone coordination. No cranial nerve deficit noted. PSYCHIATRIC: Normal mood and affect. Normal behavior. Normal judgment and thought content. CARDIOVASCULAR: Normal heart rate noted RESPIRATORY: Effort and breath sounds normal, no problems with respiration noted ABDOMEN: No masses noted. No other overt distention noted.   PELVIC: Enlarged and indurated area noted involving the clitoral and periclitoral area, and also the right labium majus. Small boil seen on outside on the inferior portion of the right labium minus. All areas are tender to touch, unable to express any drainage.  Mild inflammation noted.  See images below.        Recent Imaging  Nm Pet Image Restag (ps) Skull Base To Thigh  Result Date: 07/25/2018 CLINICAL DATA:  Subsequent treatment strategy for stage IV breast cancer. EXAM: NUCLEAR MEDICINE PET SKULL BASE TO THIGH TECHNIQUE: 10.7 mCi F-18 FDG was injected intravenously. Full-ring PET imaging was performed from the skull base to thigh after the radiotracer. CT data was obtained and used for attenuation correction and anatomic localization. Fasting blood glucose: 94 mg/dl COMPARISON:  Multiple exams, including 01/15/2018 FINDINGS: Mediastinal blood pool activity: SUV max 1.8 NECK: No soft tissue hypermetabolic activity identified in this region. Incidental CT findings: none CHEST: No findings of  metastatic disease to the lungs or soft tissues of the chest. Incidental CT findings: Prior left mastectomy and axillary dissection. Atherosclerotic calcification of the aortic arch. Mild cardiomegaly. ABDOMEN/PELVIS: Extensive heavy burden of metastatic disease throughout all segments of the liver, markedly increased from the prior CT abdomen of 05/03/2018 and markedly increased from the PET-CT of 01/15/1998. The masses are nearly confluent. An index lesion in segment 2 of the liver anteriorly measures approximately 4.7 by 3.7 cm on image 87/4 and has a maximum SUV of 17.3. There is wall thickening in the proximal stomach with associated hypermetabolic activity, a representative section of the lateral wall of the stomach body has maximum SUV of 14.4. Small but hypermetabolic bilateral inguinal lymph nodes are present, a right inguinal lymph node measures 1.3 cm in short axis on image 176/4 with maximum SUV 11.1 There is suspected tumor nodularity in the right renal hilum potentially contributing to right hydronephrosis and delayed excretion of contrast into the right collecting system. A small focus of tumor  along the right posterior perirenal space measures about 1.0 by 0.7 cm with maximum SUV 8.7. A small focus of suspected tumor along the inferior margin of the left adrenal gland has maximum SUV of 5.8. Incidental CT findings: IVC filter noted. SKELETON: Extensive widespread multifocal osseous metastatic disease throughout the skeleton essentially involving all ribs and vertebral levels as well as the bilateral humerus, femurs, bony pelvis, cervical spine levels, left side of the clivus, and bilateral scapula as well as the sternum. These lesions are primarily lytic. The overall burden of involvement in the skeleton is significantly increased. An index right posterior iliac bone lesion has maximum SUV of 12.4, formerly 6.3. There are multiple foci of intramuscular tumor deposits in the thoracic and lumbar  paraspinal and psoas musculature as well as a deposit in the right distal iliopsoas muscle, right vastus musculature, and left inferior gluteus maximus muscle. A new index right posterior paraspinal muscular lesion at the L2 level has maximum SUV of 8.6. Incidental CT findings: Aside from the extensive primarily lytic osseous metastatic disease throughout the skeleton, there are pathologic rib fractures bilaterally. IMPRESSION: 1. Marked progression of metastatic disease with extensive tumor burden in the skeleton and liver. Multifocal intramuscular metastatic lesions in the paraspinal musculature and pelvic musculature. 2. The bony lesions include a lytic lesion of the left side of the clivus/skull base. 3. Mild to moderate right hydronephrosis and delayed renal excretion due to suspected tumor along the right renal pelvis. There is also suspected right perirenal tumor posteriorly, and a suspected small metastatic lesion to the left adrenal gland. 4. Other imaging findings of potential clinical significance: Aortic Atherosclerosis (ICD10-I70.0). Mild cardiomegaly. An IVC filter is in place. Electronically Signed   By: Van Clines M.D.   On: 07/25/2018 12:20      Assessment and Plan:    1. Vulvar lesion 2. Metastatic breast cancer ([place]) Looks inflammatory, could be due to blocked glans secondary to foley trauma.  Less concerned about metastatic cancer, patient was offered biopsy but she declined. Warm compresses bid-tid recommended for now, analgesia prn.  Doxycyline prescribed. Will follow up in one week for reevaluation.  - doxycycline (VIBRAMYCIN) 100 MG capsule; Take 1 capsule (100 mg total) by mouth 2 (two) times daily.  Dispense: 14 capsule; Refill: 0  Routine preventative health maintenance measures emphasized. Please refer to After Visit Summary for other counseling recommendations.   Return in about 1 week (around 08/22/2018) for OFFICE Visit for follow up.    Total face-to-face time with  patient: 25 minutes.  Over 50% of encounter was spent on counseling and coordination of care.   Verita Schneiders, MD, [place] for [employer], [place]

## 2018-08-19 ENCOUNTER — Telehealth: Payer: Self-pay | Admitting: Student

## 2018-08-19 ENCOUNTER — Other Ambulatory Visit: Payer: Self-pay | Admitting: Physician Assistant

## 2018-08-19 DIAGNOSIS — R1111 Vomiting without nausea: Secondary | ICD-10-CM

## 2018-08-19 NOTE — Telephone Encounter (Signed)
Called the patient to inform of the upcoming appointment. Informed the patient of wearing a face mask and no visitors due to [place] restrictions.

## 2018-08-20 ENCOUNTER — Ambulatory Visit: Payer: Medicare Other | Admitting: Obstetrics & Gynecology

## 2018-08-25 ENCOUNTER — Telehealth: Payer: Self-pay

## 2018-08-25 ENCOUNTER — Inpatient Hospital Stay: Payer: Medicare Other

## 2018-08-25 ENCOUNTER — Ambulatory Visit: Payer: Medicare Other

## 2018-08-25 ENCOUNTER — Ambulatory Visit: Payer: Medicare Other | Admitting: Nurse Practitioner

## 2018-08-25 ENCOUNTER — Ambulatory Visit: Payer: Medicare Other | Admitting: Hematology

## 2018-08-25 NOTE — Progress Notes (Deleted)
[place]   [phone] [phone]   Clinic Follow up Note   Patient Care Team: Ladell Pier, MD as PCP - General (Internal Medicine) [name], MD as Consulting Physician (Pulmonary Disease) [name], MD as Consulting Physician (Hematology) Clent Demark, PA-C as Physician Assistant (Physician Assistant) Berneta Sages, NP as Nurse Practitioner (Adult Health Nurse Practitioner) 08/25/2018  CHIEF COMPLAINT:   SUMMARY OF ONCOLOGIC HISTORY: Oncology History   Cancer Staging Malignant neoplasm of overlapping sites of left breast in female, estrogen receptor positive ([place]) Staging form: Breast, AJCC 7th Edition - Clinical: Stage IIIA (T2, N2, M0) - Unsigned       Malignant neoplasm of overlapping sites of left breast in female, estrogen receptor positive ([place])   05/2008 Cancer Diagnosis    Left sided breast cancer (no path report available)    11/2008 Pathologic Stage    Stage IIIA: T2 N2     Neo-Adjuvant Chemotherapy    Paclitaxel weekly x 12; doxorubicin and cyclophosphamide x 4 - both given with trifiparnib (treated at [facility] in [place])    11/2008 Definitive Surgery    Left modified radiation mastectomy: invasive lobular carcinoma, ER+, PR+, HER2/neu negative, 5/22 LN positive     - 03/2009 Radiation Therapy    Adjuvant radiation to left chest wall    2011 - 08/2014 Anti-estrogen oral therapy    Tamoxifen 20 mg used briefly; discontinued due to uterine lining concerns; changed to anastrozole 1 mg daily (began 07/2100); discontinued by patient summer of 2016    05/26/2015 Progression    Iliac bone biopsy showed metastatic carcinoma consistent with a breast primary, ER positive, PR and HER-2 negative. Her staging CT, and a PET scan was negative for visceral metastasis.    05/2015 - 01/2018 Anti-estrogen oral therapy    1. letrozole and Ibrance started March 2017             -Ibrance dose decreased to 75 mg daily. Stopped  in 11/2017 due to anemia   -Letrozole switched to Exemestane on 02/13/17 due to b/l rib pain. She exemestane herself in 01/2018.      11/16/2015 Miscellaneous    Xgeva monthly for bone metastasis     07/03/2016 Imaging    CT chest, abdomen and pelvis with contrast showed no evidence of metastasis.     02/07/2017 Imaging    CT AP W Contrast 02/07/17 IMPRESSION: 1. No CT findings for abdominal/pelvic metastatic disease. 2. No acute abdominal findings, mass lesions or adenopathy. 3. Status post cholecystectomy with mild associated common bile duct dilatation. 4. Somewhat thickened appendix appears relatively stable. No acute inflammatory process. 5. Stable mixed lytic and sclerotic osseous metastatic disease.     02/07/2017 Imaging    Bone Scan Whole Body 02/07/17 IMPRESSION: No scintigraphic evidence skeletal metastasis     08/12/2017 Imaging    Whole Boday Scan 08/12/17 IMPRESSION: Scattered degenerative type uptake as above with a questionable focus of increased tracer localization at L2 vertebral body versus artifact; no abnormality is seen at this site by CT. Consider either characterization by MR or attention on follow-up imaging.    08/12/2017 Imaging    CT AP W Contrast 08/12/17  IMPRESSION: Continued thickening of the appendix is noted without surrounding inflammation. It has maximum measured diameter is decreased compared to prior exam. There is no evidence of acute inflammation. Stable mixed lytic and sclerotic appearance of visualized skeleton is noted consistent with history of known osseous metastases.  No acute abnormality seen in the abdomen or pelvis.    08/22/2017 Surgery    APPENDECTOMY LAPAROSCOPIC ERAS PATHWAY by Dr. Malcolm Metro and REMOVAL OF LEFT TISSUE EXPANDER by Dr. Harlow Mares 08/22/17    08/22/2017 Pathology Results    Diagnosis 08/22/17  1. Appendix, Other than Incidental - METASTATIC CARCINOMA, CONSISTENT WITH BREAST PRIMARY. - CARCINOMA IS PRESENT AT THE  SURGICAL RESECTION MARGIN AS WELL AS THE SEROSAL SURFACE. - LYMPHOVASCULAR INVASION IS IDENTIFIED. - SEE COMMENT. 2. Breast, capsule, Left - DENSE FIBROUS TISSUE WITH MILD INFLAMMATION, INCLUDING A FEW SCATTERED MULTINUCLEATED GIANT CELLS. - THERE IS NO EVIDENCE OF MALIGNANCY.    08/27/2017 Imaging    CT AP W Contrast 08/27/17 IMPRESSION: No evidence of pulmonary embolus. Small pericardial effusion with uncertain clinical significance. No evidence of acute abnormalities within the chest, abdomen or pelvis. Area of fat stranding and small focus of gas within the anterior abdominal wall, immediately inferior to the umbilicus, probably at the site of laparoscopic access.    10/07/2017 Echocardiogram    10/07/2017 ECO Study Conclusions  - Procedure narrative: Transthoracic echocardiography. Image   quality was adequate. The study was technically difficult, as a   result of poor acoustic windows. - Left ventricle: The cavity size was normal. Wall thickness was   normal. Systolic function was normal. The estimated ejection   fraction was in the range of 60% to 65%. Wall motion was normal;   there were no regional wall motion abnormalities. Features are   consistent with a pseudonormal left ventricular filling pattern,   with concomitant abnormal relaxation and increased filling   pressure (grade 2 diastolic dysfunction).    01/16/2018 PET scan    01/16/2018 PET Scan IMPRESSION: 1. Most striking finding is intense marrow activity diffusely throughout the axillary and appendicular skeleton. This is favored a physiologic marrow response from chronic anemia rather than diffuse metastatic disease. 2. Several discrete foci of more intense activity noted in the spine which could indicate malignancy or trauma. Difficult to define malignancy on the background of diffuse marrow activity. 3. Diffuse sclerosis throughout the bones similar to a recent CT scans but new from remote PET-CT scan  03/01/2016. 4. No evidence soft tissue metastasis.     04/02/2018 Imaging    CT Angio CAP 04/02/18  IMPRESSION: 1. Multiple new hepatic lesions highly concerning for developing metastatic disease to the liver. This could be confirmed with nonemergent MRI of the abdomen with and without IV gadolinium if clinically appropriate. 2. Widespread metastatic disease to the bones also appears progressive compared to prior examinations. 3. Large right-sided pelvic deep venous thrombosis extending from the right common femoral vein to the proximal right external iliac vein. IVC filter is in position. No clot identified in the IVC filter at this time. No pulmonary embolism on today's examination. 4. Aortic atherosclerosis. 5. Additional incidental findings, as above.    05/01/2018 -  Anti-estrogen oral therapy    Fulvestrant injections 554m monthly starting 05/01/18 -added oral Piqray 3071mdaily starting this week    06/30/2018 -  Antibody Plan    Monthly Xgeva restarted 06/30/18     07/25/2018 PET scan    PET  IMPRESSION: 1. Marked progression of metastatic disease with extensive tumor burden in the skeleton and liver. Multifocal intramuscular metastatic lesions in the paraspinal musculature and pelvic musculature. 2. The bony lesions include a lytic lesion of the left side of the clivus/skull base. 3. Mild to moderate right hydronephrosis and delayed renal excretion due to suspected  tumor along the right renal pelvis. There is also suspected right perirenal tumor posteriorly, and a suspected small metastatic lesion to the left adrenal gland. 4. Other imaging findings of potential clinical significance: Aortic Atherosclerosis (ICD10-I70.0). Mild cardiomegaly. An IVC filter is in place.     CURRENT THERAPY:  -Aranesp injection every 2 weeks -B12 injectionsmonthly  -Lovenox 17m q12h, changed to 1024mevery 12 hours in early Feb 2020at home(adjusted based on her weight)  -Monthlyfulvestrant injection50045mtarting on 2/8/20with loading dose for first month. Monthly starting 06/27/18.  --Pending Piqray 300m80mily -Monthly Xgevarestarted4/6/20  INTERVAL HISTORY: Ms. ScotPallaurns for follow up as scheduled. She was last seen by Dr. FengBurr Medicoeeks ago. S/p B12, aranesp, and Xgeva on 08/04/18. She declined fulvestrant that day.   REVIEW OF SYSTEMS:   Constitutional: Denies fevers, chills or abnormal weight loss Eyes: Denies blurriness of vision Ears, nose, mouth, throat, and face: Denies mucositis or sore throat Respiratory: Denies cough, dyspnea or wheezes Cardiovascular: Denies palpitation, chest discomfort or lower extremity swelling Gastrointestinal:  Denies nausea, heartburn or change in bowel habits Skin: Denies abnormal skin rashes Lymphatics: Denies new lymphadenopathy or easy bruising Neurological:Denies numbness, tingling or new weaknesses Behavioral/Psych: Mood is stable, no new changes  All other systems were reviewed with the patient and are negative.  MEDICAL HISTORY:  Past Medical History:  Diagnosis Date  . Anxiety   . Arthritis    hands, knees, hips  . Asthma   . Bronchitis   . Cancer (HCC)[place]. Cervical stenosis (uterine cervix)   . Disorder of appendix    Enlarged  . Dyspnea on exertion   . GERD (gastroesophageal reflux disease)   . Headache(784.0)   . History of breast cancer    2010--  LEFT  s/[place]  [place]aste[place]--  NO RECURRENCE  . History of cervical dysplasia   . Hyperlipidemia   . Hypertension   . Malignant neoplasm of overlapping sites of left breast in female, estrogen receptor positive (HCC)[place]/01/2013  . OSA (obstructive sleep apnea) moderate osa per study  09/2010   CPAP  , NOT USING ON REGULAR BASIS  . Pelvic pain in female   . Positive H. pylori test    08-05-2013  . Positive TB test    [place] TE[place]--  [place]/ MEDS  . Refusal of blood transfusions as patient is Jehovah's Witness   . Seasonal  allergies   . Uterine fibroid   . Wears glasses     SURGICAL HISTORY: Past Surgical History:  Procedure Laterality Date  . APPENDECTOMY  y  . CARDIOVASCULAR STRESS TEST  10-29-2012   low risk perfusion study/  no significant reversibity/ ef 66%/  normal wall motion  . CERVICAL CONIZATION W/BX  2012   in  [place]  [place] 05/14/2012   Procedure: LAPAROSCOPIC CHOLECYSTECTOMY;  Surgeon: ArmaRalene Ok;  [facility];  Laterality: N/A;  . COLONOSCOPY  2011   normal per patient - [place]  . DILATION AND CURETTAGE OF UTERUS  10/15/2011   Procedure: DILATATION AND CURETTAGE;  Surgeon: ThomMelina Schools;  [facility];  Service: Gynecology;  Laterality: N/A;  Conization &  endocervical curettings  . EXAMINATION UNDER ANESTHESIA N/A 08/20/2013   Procedure: EXAM UNDER ANESTHESIA;  Surgeon: RichMargarette Asal;  [facility];  Laterality: N/A;  . [place]  . IR IVC FILTER  PLMT / S&I Burke Keels GUID/MOD SED  03/01/2018  . IR RADIOLOGIST EVAL & MGMT  04/08/2018  . LAPAROSCOPIC APPENDECTOMY N/A 08/22/2017   Procedure: [place];  Surgeon: Coralie Keens, MD;  Location: [place];  Service: General;  Laterality: N/A;  . LAPAROSCOPIC ASSISTED VAGINAL HYSTERECTOMY N/A 10/26/2014   Procedure: HYSTERECTOMY ABDOMINAL ;  Surgeon: Molli Posey, MD;  Location: [facility];  Service: Gynecology;  Laterality: N/A;  . MASTECTOMY Left 11/2008  in [place]   . SALPINGOOPHORECTOMY Bilateral 10/26/2014   Procedure: BILATERAL SALPINGO OOPHORECTOMY;  Surgeon: Molli Posey, MD;  Location: [facility];  Service: Gynecology;  Laterality: Bilateral;  . TISSUE EXPANDER PLACEMENT Left 08/22/2017   Procedure: REMOVAL OF LEFT TISSUE EXPANDER;  Surgeon: Crissie Reese, MD;  Location: [place];  Service: Plastics;  Laterality: Left;  . TISSUE EXPANDER REMOVAL Left 08/22/2017  . TRANSTHORACIC  ECHOCARDIOGRAM  10-29-2012   mild lvh/  ef 60-65%/  grade II diastolic dysfunction/  trivial mr  &  tr    I have reviewed the social history and family history with the patient and they are unchanged from previous note.  ALLERGIES:  is allergic to lisinopril-hydrochlorothiazide; adhesive [tape]; fentanyl; gabapentin; hctz [hydrochlorothiazide]; latex; lisinopril; losartan potassium; other; oxycodone; and tums [calcium carbonate antacid].  MEDICATIONS:  Current Outpatient Medications  Medication Sig Dispense Refill  . acetaminophen (TYLENOL) 500 MG tablet Take 500 mg by mouth every 6 (six) hours as needed for mild pain.    . carvedilol (COREG) 12.5 MG tablet Take 1 tablet (12.5 mg total) by mouth 2 (two) times daily with a meal. 60 tablet 2  . cetirizine (ZYRTEC) 10 MG tablet Take 10 mg by mouth daily as needed for allergies or rhinitis.   11  . docusate sodium (COLACE) 100 MG capsule Take 1 capsule (100 mg total) by mouth daily as needed for mild constipation. (Patient taking differently: Take 200 mg by mouth every other day. ) 10 capsule 0  . doxycycline (VIBRAMYCIN) 100 MG capsule Take 1 capsule (100 mg total) by mouth 2 (two) times daily. 14 capsule 0  . enoxaparin (LOVENOX) 100 MG/ML injection 100 mg 2 (two) times daily.    . feeding supplement, ENSURE ENLIVE, (ENSURE ENLIVE) LIQD Take 237 mLs by mouth 2 (two) times daily between meals. (Patient taking differently: Take 237 mLs by mouth See admin instructions. Drink 237 ml's by mouth two to three times a day (Non-dairy Ensure)) 237 mL 12  . hydrALAZINE (APRESOLINE) 10 MG tablet Take 1 tablet (10 mg total) by mouth 3 (three) times daily. 90 tablet 0  . ipratropium-albuterol (DUONEB) 0.5-2.5 (3) MG/3ML SOLN INHALE THREE MLS VIA NEBULIZER EVERY 6 HOURS AS NEEDED 360 mL 5  . traMADol (ULTRAM) 50 MG tablet Take 2 tablets (100 mg total) by mouth every 6 (six) hours. (Patient taking differently: Take 50 mg by mouth every 6 (six) hours as needed for  moderate pain or severe pain. ) 30 tablet 0   No current facility-administered medications for this visit.     PHYSICAL EXAMINATION: ECOG PERFORMANCE STATUS: {CHL ONC ECOG PS:[phone]}  There were no vitals filed for this visit. There were no vitals filed for this visit.  GENERAL:alert, no distress and comfortable SKIN: skin color, texture, turgor are normal, no rashes or significant lesions EYES: normal, Conjunctiva are pink and non-injected, sclera clear OROPHARYNX:no exudate, no erythema and lips, buccal mucosa, and tongue normal  NECK: supple, thyroid normal size, non-tender, without nodularity LYMPH:  no palpable lymphadenopathy in the cervical, axillary or inguinal LUNGS: clear to auscultation and percussion with normal breathing effort HEART: regular rate & rhythm and no murmurs and no lower extremity edema ABDOMEN:abdomen soft, non-tender and normal bowel sounds Musculoskeletal:no cyanosis of digits and no clubbing  NEURO: alert & oriented x 3 with fluent speech, no focal motor/sensory deficits  LABORATORY DATA:  I have reviewed the data as listed CBC Latest Ref Rng & Units 08/04/2018 07/28/2018 07/14/2018  WBC 4.0 - 10.5 K/uL 4.4 4.1 4.0  Hemoglobin 12.0 - 15.0 g/dL 7.1(L) 6.9(LL) 6.0(LL)  Hematocrit 36.0 - 46.0 % 24.9(L) 24.9(L) 20.6(L)  Platelets 150 - 400 K/uL 141(L) 143(L) 124(L)     CMP Latest Ref Rng & Units 08/04/2018 06/30/2018 06/13/2018  Glucose 70 - 99 mg/dL 111(H) 124(H) 107(H)  BUN 6 - 20 mg/dL _0 Creatinine 0.44 - 1.00 mg/dL 0.90 1.13(H) 0.97  Sodium 135 - 145 mmol/L 134(L) 137 139  Potassium 3.5 - 5.1 mmol/L 4.2 3.8 4.0  Chloride 98 - 111 mmol/L 105 105 106  CO2 22 - 32 mmol/L 19(L) 21(L) 22  Calcium 8.9 - 10.3 mg/dL 9.5 12.5(H) 9.6  Total Protein 6.5 - 8.1 g/dL 7.2 7.6 6.9  Total Bilirubin 0.3 - 1.2 mg/dL 1.5(H) 1.2 1.0  Alkaline Phos 38 - 126 U/L 431(H) 213(H) 174(H)  AST 15 - 41 U/L 88(H) 54(H) 33  ALT 0 - 44 U/L 37 21 15       RADIOGRAPHIC STUDIES: I have personally reviewed the radiological images as listed and agreed with the findings in the report. No results found.   ASSESSMENT & PLAN:  No problem-specific Assessment & Plan notes found for this encounter.   No orders of the defined types were placed in this encounter.  All questions were answered. The patient knows to call the clinic with any problems, questions or concerns. No barriers to learning was detected. I spent {CHL ONC TIME VISIT - ATVVL:[id number]} counseling the patient face to face. The total time spent in the appointment was {CHL ONC TIME VISIT - SQSYP:[id number]} and more than 50% was on counseling and review of test results     Alla Feeling, NP 08/25/18

## 2018-08-25 NOTE — Telephone Encounter (Signed)
PROGRESS NOTE Per Lacie sent scheduling to reschedule lab, doctor visit with Lacie or Dr Burr Medico, and injection.

## 2018-08-25 NOTE — Telephone Encounter (Signed)
Left voicemail for patient to call back [place]. Was trying to reach her per Lacie to see how she was doing since she missed her scheduled appointment today with Lacie at 12:15p. Will try again later

## 2018-08-25 NOTE — Progress Notes (Deleted)
Per Lacie sent scheduling message to reschedule lab, doctor visit with [facility] or Dr Burr Medico, and injection.

## 2018-08-26 ENCOUNTER — Telehealth: Payer: Self-pay | Admitting: Hematology

## 2018-08-26 ENCOUNTER — Telehealth: Payer: Self-pay

## 2018-08-26 NOTE — Telephone Encounter (Signed)
TC per Lacie to patient to see how she was doing since she missed her appointment yesterday 08/25/18 @ 12:15p. Patient stated that yesterday she missed her appointment because she was not feeling well. She stated that she was unable to move her bowels yesterday and was in pain. She was also unable to eat anything due to pain. I asked her how she was feeling this morning and she stated that she was feeling better. That she took some laxative and was eventually able to move her bowels. I asked was she able to eat anything and she stated that she had not tried to eat anything yet. I Let her know to make sure that she calls [place] back if nothing changes or if she is still unable able to eat so that I can let Lacie and Dr Burr Medico know. Patient verbalized understanding. I also let her know that I sent a scheduling message for the schedulers to reschedule her appointment with Lacie or Dr Burr Medico. Lacie aware of patient complaints.

## 2018-08-26 NOTE — Telephone Encounter (Signed)
R/s appt per 6/1 sch message - pt aware of new appt date and time

## 2018-09-01 ENCOUNTER — Emergency Department (HOSPITAL_COMMUNITY): Payer: Medicare Other

## 2018-09-01 ENCOUNTER — Inpatient Hospital Stay (HOSPITAL_BASED_OUTPATIENT_CLINIC_OR_DEPARTMENT_OTHER): Payer: Medicare Other | Admitting: Nurse Practitioner

## 2018-09-01 ENCOUNTER — Inpatient Hospital Stay: Payer: Medicare Other

## 2018-09-01 ENCOUNTER — Encounter: Payer: Self-pay | Admitting: Nurse Practitioner

## 2018-09-01 ENCOUNTER — Inpatient Hospital Stay: Payer: Medicare Other | Admitting: Medical

## 2018-09-01 ENCOUNTER — Inpatient Hospital Stay (HOSPITAL_COMMUNITY)
Admission: EM | Admit: 2018-09-01 | Discharge: 2018-09-05 | DRG: 436 | Disposition: A | Discharge status: Hospice | Payer: Medicare Other | Source: Ambulatory Visit | Attending: Family Medicine | Admitting: Family Medicine

## 2018-09-01 ENCOUNTER — Other Ambulatory Visit: Payer: Self-pay | Admitting: Medical

## 2018-09-01 ENCOUNTER — Telehealth: Payer: Self-pay | Admitting: *Deleted

## 2018-09-01 ENCOUNTER — Encounter (HOSPITAL_COMMUNITY): Payer: Self-pay

## 2018-09-01 ENCOUNTER — Other Ambulatory Visit: Payer: Self-pay

## 2018-09-01 ENCOUNTER — Inpatient Hospital Stay: Payer: Medicare Other | Attending: Hematology

## 2018-09-01 ENCOUNTER — Ambulatory Visit (HOSPITAL_COMMUNITY)
Admission: RE | Admit: 2018-09-01 | Discharge: 2018-09-01 | Disposition: A | Discharge status: Home / Self Care | Payer: Medicare Other | Source: Ambulatory Visit | Attending: Medical | Admitting: Medical

## 2018-09-01 VITALS — BP 116/69 | HR 102 | Temp 98.2°F | Resp 18 | Ht 67.0 in

## 2018-09-01 DIAGNOSIS — Z531 Procedure and treatment not carried out because of patient's decision for reasons of belief and group pressure: Secondary | ICD-10-CM | POA: Diagnosis present

## 2018-09-01 DIAGNOSIS — G893 Neoplasm related pain (acute) (chronic): Secondary | ICD-10-CM

## 2018-09-01 DIAGNOSIS — Z9181 History of falling: Secondary | ICD-10-CM

## 2018-09-01 DIAGNOSIS — Z170 Estrogen receptor positive status [ER+]: Secondary | ICD-10-CM

## 2018-09-01 DIAGNOSIS — R627 Adult failure to thrive: Secondary | ICD-10-CM

## 2018-09-01 DIAGNOSIS — R945 Abnormal results of liver function studies: Secondary | ICD-10-CM

## 2018-09-01 DIAGNOSIS — J45909 Unspecified asthma, uncomplicated: Secondary | ICD-10-CM | POA: Diagnosis not present

## 2018-09-01 DIAGNOSIS — K5901 Slow transit constipation: Secondary | ICD-10-CM

## 2018-09-01 DIAGNOSIS — C7951 Secondary malignant neoplasm of bone: Secondary | ICD-10-CM | POA: Diagnosis not present

## 2018-09-01 DIAGNOSIS — Z86711 Personal history of pulmonary embolism: Secondary | ICD-10-CM

## 2018-09-01 DIAGNOSIS — Z87891 Personal history of nicotine dependence: Secondary | ICD-10-CM

## 2018-09-01 DIAGNOSIS — R059 Cough, unspecified: Secondary | ICD-10-CM

## 2018-09-01 DIAGNOSIS — R531 Weakness: Secondary | ICD-10-CM

## 2018-09-01 DIAGNOSIS — Z6829 Body mass index (BMI) 29.0-29.9, adult: Secondary | ICD-10-CM

## 2018-09-01 DIAGNOSIS — K7290 Hepatic failure, unspecified without coma: Secondary | ICD-10-CM

## 2018-09-01 DIAGNOSIS — Z86718 Personal history of other venous thrombosis and embolism: Secondary | ICD-10-CM

## 2018-09-01 DIAGNOSIS — R0789 Other chest pain: Secondary | ICD-10-CM

## 2018-09-01 DIAGNOSIS — C787 Secondary malignant neoplasm of liver and intrahepatic bile duct: Secondary | ICD-10-CM | POA: Diagnosis not present

## 2018-09-01 DIAGNOSIS — C50812 Malignant neoplasm of overlapping sites of left female breast: Secondary | ICD-10-CM

## 2018-09-01 DIAGNOSIS — G4733 Obstructive sleep apnea (adult) (pediatric): Secondary | ICD-10-CM | POA: Diagnosis present

## 2018-09-01 DIAGNOSIS — R5383 Other fatigue: Secondary | ICD-10-CM

## 2018-09-01 DIAGNOSIS — K219 Gastro-esophageal reflux disease without esophagitis: Secondary | ICD-10-CM | POA: Diagnosis present

## 2018-09-01 DIAGNOSIS — R740 Nonspecific elevation of levels of transaminase and lactic acid dehydrogenase [LDH]: Secondary | ICD-10-CM | POA: Diagnosis present

## 2018-09-01 DIAGNOSIS — Z7189 Other specified counseling: Secondary | ICD-10-CM

## 2018-09-01 DIAGNOSIS — D500 Iron deficiency anemia secondary to blood loss (chronic): Secondary | ICD-10-CM

## 2018-09-01 DIAGNOSIS — I2699 Other pulmonary embolism without acute cor pulmonale: Secondary | ICD-10-CM | POA: Diagnosis present

## 2018-09-01 DIAGNOSIS — Z66 Do not resuscitate: Secondary | ICD-10-CM | POA: Diagnosis not present

## 2018-09-01 DIAGNOSIS — E785 Hyperlipidemia, unspecified: Secondary | ICD-10-CM | POA: Diagnosis present

## 2018-09-01 DIAGNOSIS — I82402 Acute embolism and thrombosis of unspecified deep veins of left lower extremity: Secondary | ICD-10-CM

## 2018-09-01 DIAGNOSIS — K5900 Constipation, unspecified: Secondary | ICD-10-CM

## 2018-09-01 DIAGNOSIS — Z95828 Presence of other vascular implants and grafts: Secondary | ICD-10-CM

## 2018-09-01 DIAGNOSIS — R05 Cough: Secondary | ICD-10-CM

## 2018-09-01 DIAGNOSIS — I10 Essential (primary) hypertension: Secondary | ICD-10-CM

## 2018-09-01 DIAGNOSIS — I4891 Unspecified atrial fibrillation: Secondary | ICD-10-CM

## 2018-09-01 DIAGNOSIS — C50919 Malignant neoplasm of unspecified site of unspecified female breast: Secondary | ICD-10-CM | POA: Diagnosis present

## 2018-09-01 DIAGNOSIS — Z853 Personal history of malignant neoplasm of breast: Secondary | ICD-10-CM

## 2018-09-01 DIAGNOSIS — D630 Anemia in neoplastic disease: Secondary | ICD-10-CM | POA: Diagnosis not present

## 2018-09-01 DIAGNOSIS — D649 Anemia, unspecified: Secondary | ICD-10-CM

## 2018-09-01 DIAGNOSIS — C785 Secondary malignant neoplasm of large intestine and rectum: Secondary | ICD-10-CM

## 2018-09-01 DIAGNOSIS — I5189 Other ill-defined heart diseases: Secondary | ICD-10-CM

## 2018-09-01 DIAGNOSIS — D6959 Other secondary thrombocytopenia: Secondary | ICD-10-CM | POA: Diagnosis present

## 2018-09-01 DIAGNOSIS — I480 Paroxysmal atrial fibrillation: Secondary | ICD-10-CM | POA: Diagnosis present

## 2018-09-01 DIAGNOSIS — Z1159 Encounter for screening for other viral diseases: Secondary | ICD-10-CM

## 2018-09-01 DIAGNOSIS — Z515 Encounter for palliative care: Secondary | ICD-10-CM

## 2018-09-01 DIAGNOSIS — R7989 Other specified abnormal findings of blood chemistry: Secondary | ICD-10-CM | POA: Diagnosis present

## 2018-09-01 DIAGNOSIS — I110 Hypertensive heart disease with heart failure: Secondary | ICD-10-CM | POA: Diagnosis present

## 2018-09-01 DIAGNOSIS — I5032 Chronic diastolic (congestive) heart failure: Secondary | ICD-10-CM | POA: Diagnosis present

## 2018-09-01 LAB — CMP (CANCER CENTER ONLY)
ALT: 45 U/L — ABNORMAL HIGH (ref 0–44)
AST: 224 U/L (ref 15–41)
Albumin: 2.7 g/dL — ABNORMAL LOW (ref 3.5–5.0)
Alkaline Phosphatase: 485 U/L — ABNORMAL HIGH (ref 38–126)
Anion gap: 12 (ref 5–15)
BUN: 21 mg/dL — ABNORMAL HIGH (ref 6–20)
CO2: 19 mmol/L — ABNORMAL LOW (ref 22–32)
Calcium: 12 mg/dL — ABNORMAL HIGH (ref 8.9–10.3)
Chloride: 102 mmol/L (ref 98–111)
Creatinine: 1.17 mg/dL — ABNORMAL HIGH (ref 0.44–1.00)
GFR, Est AFR Am: 59 mL/min — ABNORMAL LOW (ref >60)
GFR, Estimated: 51 mL/min — ABNORMAL LOW (ref >60)
Glucose, Bld: 107 mg/dL — ABNORMAL HIGH (ref 70–99)
Potassium: 4.2 mmol/L (ref 3.5–5.1)
Sodium: 133 mmol/L — ABNORMAL LOW (ref 135–145)
Total Bilirubin: 4.8 mg/dL (ref 0.3–1.2)
Total Protein: 6.7 g/dL (ref 6.5–8.1)

## 2018-09-01 LAB — CBC WITH DIFFERENTIAL (CANCER CENTER ONLY)
Abs Immature Granulocytes: 0.13 10*3/uL — ABNORMAL HIGH (ref 0.00–0.07)
Basophils Absolute: 0.1 10*3/uL (ref 0.0–0.1)
Basophils Relative: 1 %
Eosinophils Absolute: 0 10*3/uL (ref 0.0–0.5)
Eosinophils Relative: 1 %
HCT: 27.8 % — ABNORMAL LOW (ref 36.0–46.0)
Hemoglobin: 8 g/dL — ABNORMAL LOW (ref 12.0–15.0)
Immature Granulocytes: 3 %
Lymphocytes Relative: 31 %
Lymphs Abs: 1.5 10*3/uL (ref 0.7–4.0)
MCH: 27.3 pg (ref 26.0–34.0)
MCHC: 28.8 g/dL — ABNORMAL LOW (ref 30.0–36.0)
MCV: 94.9 fL (ref 80.0–100.0)
Monocytes Absolute: 0.4 10*3/uL (ref 0.1–1.0)
Monocytes Relative: 9 %
Neutro Abs: 2.5 10*3/uL (ref 1.7–7.7)
Neutrophils Relative %: 55 %
Platelet Count: 63 10*3/uL — ABNORMAL LOW (ref 150–400)
RBC: 2.93 MIL/uL — ABNORMAL LOW (ref 3.87–5.11)
RDW: 20.5 % — ABNORMAL HIGH (ref 11.5–15.5)
WBC Count: 4.6 10*3/uL (ref 4.0–10.5)
nRBC: 13 % — ABNORMAL HIGH (ref 0.0–0.2)

## 2018-09-01 LAB — HEMOGLOBIN A1C
Hgb A1c MFr Bld: 4.7 % — ABNORMAL LOW (ref 4.8–5.6)
Mean Plasma Glucose: 88.19 mg/dL

## 2018-09-01 LAB — SARS CORONAVIRUS 2 BY RT PCR (HOSPITAL ORDER, PERFORMED IN ~~LOC~~ HOSPITAL LAB): SARS Coronavirus 2: NEGATIVE

## 2018-09-01 MED ORDER — SENNA 8.6 MG PO TABS
1.0000 | ORAL_TABLET | Freq: Two times a day (BID) | ORAL | Status: DC
  Administered 2018-09-01 – 2018-09-05 (×5): 8.6 mg via ORAL
  Filled 2018-09-01 (×7): qty 1

## 2018-09-01 MED ORDER — ONDANSETRON HCL 4 MG/2ML IJ SOLN
4.0000 mg | Freq: Four times a day (QID) | INTRAMUSCULAR | Status: DC | PRN
  Administered 2018-09-02: 4 mg via INTRAVENOUS
  Filled 2018-09-01 (×2): qty 2

## 2018-09-01 MED ORDER — SODIUM CHLORIDE 0.9 % IV SOLN
INTRAVENOUS | Status: AC
  Administered 2018-09-01: via INTRAVENOUS

## 2018-09-01 MED ORDER — BISACODYL 10 MG RE SUPP
10.0000 mg | Freq: Every day | RECTAL | Status: DC | PRN
  Administered 2018-09-02: 10 mg via RECTAL
  Filled 2018-09-01: qty 1

## 2018-09-01 MED ORDER — CARVEDILOL 6.25 MG PO TABS
6.2500 mg | ORAL_TABLET | Freq: Two times a day (BID) | ORAL | Status: DC
  Administered 2018-09-02 – 2018-09-03 (×3): 6.25 mg via ORAL
  Filled 2018-09-01 (×4): qty 1

## 2018-09-01 MED ORDER — TRAMADOL HCL 50 MG PO TABS
50.0000 mg | ORAL_TABLET | Freq: Four times a day (QID) | ORAL | Status: DC | PRN
  Administered 2018-09-02 – 2018-09-05 (×4): 50 mg via ORAL
  Filled 2018-09-01 (×4): qty 1

## 2018-09-01 MED ORDER — MORPHINE SULFATE (PF) 2 MG/ML IV SOLN
2.0000 mg | INTRAVENOUS | Status: DC | PRN
  Administered 2018-09-03: 2 mg via INTRAVENOUS
  Filled 2018-09-01: qty 1

## 2018-09-01 MED ORDER — POLYETHYLENE GLYCOL 3350 17 G PO PACK
17.0000 g | PACK | Freq: Every day | ORAL | Status: DC | PRN
  Administered 2018-09-01: 17 g via ORAL
  Filled 2018-09-01: qty 1

## 2018-09-01 MED ORDER — DOCUSATE SODIUM 100 MG PO CAPS
200.0000 mg | ORAL_CAPSULE | ORAL | Status: DC
  Administered 2018-09-02: 09:00:00 200 mg via ORAL
  Filled 2018-09-01: qty 2

## 2018-09-01 MED ORDER — ACETAMINOPHEN 325 MG PO TABS: 650.0000 mg | ORAL_TABLET | Freq: Four times a day (QID) | ORAL | Status: DC | PRN

## 2018-09-01 MED ORDER — SODIUM CHLORIDE 0.9 % IV BOLUS
500.0000 mL | Freq: Once | INTRAVENOUS | Status: AC
  Administered 2018-09-01: 19:00:00 500 mL via INTRAVENOUS

## 2018-09-01 MED ORDER — ONDANSETRON HCL 4 MG PO TABS: 4.0000 mg | ORAL_TABLET | Freq: Four times a day (QID) | ORAL | Status: DC | PRN

## 2018-09-01 MED ORDER — MORPHINE SULFATE (PF) 4 MG/ML IV SOLN
4.0000 mg | Freq: Once | INTRAVENOUS | Status: DC
  Filled 2018-09-01: qty 1

## 2018-09-01 MED ORDER — IPRATROPIUM-ALBUTEROL 0.5-2.5 (3) MG/3ML IN SOLN: 3.0000 mL | Freq: Four times a day (QID) | RESPIRATORY_TRACT | Status: DC | PRN

## 2018-09-01 MED ORDER — ACETAMINOPHEN 650 MG RE SUPP: 650.0000 mg | Freq: Four times a day (QID) | RECTAL | Status: DC | PRN

## 2018-09-01 MED ORDER — SODIUM CHLORIDE 0.9 % IV SOLN
Freq: Once | INTRAVENOUS | Status: DC
  Filled 2018-09-01: qty 250

## 2018-09-01 NOTE — ED Notes (Signed)
Admitting MD at bedside.

## 2018-09-01 NOTE — ED Notes (Signed)
Attempted to call report. Rn unavailable at this time.

## 2018-09-01 NOTE — Progress Notes (Addendum)
[place]   [phone] Fax:(336) [phone]   Clinic Follow up Note   Patient Care Team: Ladell Pier, MD as PCP - General (Internal Medicine) [name], MD as Consulting Physician (Pulmonary Disease) [name], MD as Consulting Physician (Hematology) Clent Demark, PA-C as Physician Assistant (Physician Assistant) Berneta Sages, NP as Nurse Practitioner (Adult Health Nurse Practitioner) 09/01/2018  CHIEF COMPLAINT:  F/U metastatic breast cancer   SUMMARY OF ONCOLOGIC HISTORY: Oncology History   Cancer Staging Malignant neoplasm of overlapping sites of left breast in female, estrogen receptor positive ([place]) Staging form: Breast, AJCC 7th Edition - Clinical: Stage IIIA (T2, N2, M0) - Unsigned       Malignant neoplasm of overlapping sites of left breast in female, estrogen receptor positive ([place])   05/2008 Cancer Diagnosis    Left sided breast cancer (no path report available)    11/2008 Pathologic Stage    Stage IIIA: T2 N2     Neo-Adjuvant Chemotherapy    Paclitaxel weekly x 12; doxorubicin and cyclophosphamide x 4 - both given with trifiparnib (treated at [facility] in [place])    11/2008 Definitive Surgery    Left modified radiation mastectomy: invasive lobular carcinoma, ER+, PR+, HER2/neu negative, 5/22 LN positive     - 03/2009 Radiation Therapy    Adjuvant radiation to left chest wall    2011 - 08/2014 Anti-estrogen oral therapy    Tamoxifen 20 mg used briefly; discontinued due to uterine lining concerns; changed to anastrozole 1 mg daily (began 07/2100); discontinued by patient summer of 2016    05/26/2015 Progression    Iliac bone biopsy showed metastatic carcinoma consistent with a breast primary, ER positive, PR and HER-2 negative. Her staging CT, and a PET scan was negative for visceral metastasis.    05/2015 - 01/2018 Anti-estrogen oral therapy    1. letrozole and Ibrance started March 2017             -Ibrance dose  decreased to 75 mg daily. Stopped in 11/2017 due to anemia   -Letrozole switched to Exemestane on 02/13/17 due to b/l rib pain. She exemestane herself in 01/2018.      11/16/2015 Miscellaneous    Xgeva monthly for bone metastasis     07/03/2016 Imaging    CT chest, abdomen and pelvis with contrast showed no evidence of metastasis.     02/07/2017 Imaging    CT AP W Contrast 02/07/17 IMPRESSION: 1. No CT findings for abdominal/pelvic metastatic disease. 2. No acute abdominal findings, mass lesions or adenopathy. 3. Status post cholecystectomy with mild associated common bile duct dilatation. 4. Somewhat thickened appendix appears relatively stable. No acute inflammatory process. 5. Stable mixed lytic and sclerotic osseous metastatic disease.     02/07/2017 Imaging    Bone Scan Whole Body 02/07/17 IMPRESSION: No scintigraphic evidence skeletal metastasis     08/12/2017 Imaging    Whole Boday Scan 08/12/17 IMPRESSION: Scattered degenerative type uptake as above with a questionable focus of increased tracer localization at L2 vertebral body versus artifact; no abnormality is seen at this site by CT. Consider either characterization by MR or attention on follow-up imaging.    08/12/2017 Imaging    CT AP W Contrast 08/12/17  IMPRESSION: Continued thickening of the appendix is noted without surrounding inflammation. It has maximum measured diameter is decreased compared to prior exam. There is no evidence of acute inflammation. Stable mixed lytic and sclerotic appearance of visualized skeleton is noted consistent with  history of known osseous metastases. No acute abnormality seen in the abdomen or pelvis.    08/22/2017 Surgery    APPENDECTOMY LAPAROSCOPIC ERAS PATHWAY by Dr. Malcolm Metro and REMOVAL OF LEFT TISSUE EXPANDER by Dr. Harlow Mares 08/22/17    08/22/2017 Pathology Results    Diagnosis 08/22/17  1. Appendix, Other than Incidental - METASTATIC CARCINOMA, CONSISTENT WITH BREAST  PRIMARY. - CARCINOMA IS PRESENT AT THE SURGICAL RESECTION MARGIN AS WELL AS THE SEROSAL SURFACE. - LYMPHOVASCULAR INVASION IS IDENTIFIED. - SEE COMMENT. 2. Breast, capsule, Left - DENSE FIBROUS TISSUE WITH MILD INFLAMMATION, INCLUDING A FEW SCATTERED MULTINUCLEATED GIANT CELLS. - THERE IS NO EVIDENCE OF MALIGNANCY.    08/27/2017 Imaging    CT AP W Contrast 08/27/17 IMPRESSION: No evidence of pulmonary embolus. Small pericardial effusion with uncertain clinical significance. No evidence of acute abnormalities within the chest, abdomen or pelvis. Area of fat stranding and small focus of gas within the anterior abdominal wall, immediately inferior to the umbilicus, probably at the site of laparoscopic access.    10/07/2017 Echocardiogram    10/07/2017 ECO Study Conclusions  - Procedure narrative: Transthoracic echocardiography. Image   quality was adequate. The study was technically difficult, as a   result of poor acoustic windows. - Left ventricle: The cavity size was normal. Wall thickness was   normal. Systolic function was normal. The estimated ejection   fraction was in the range of 60% to 65%. Wall motion was normal;   there were no regional wall motion abnormalities. Features are   consistent with a pseudonormal left ventricular filling pattern,   with concomitant abnormal relaxation and increased filling   pressure (grade 2 diastolic dysfunction).    01/16/2018 PET scan    01/16/2018 PET Scan IMPRESSION: 1. Most striking finding is intense marrow activity diffusely throughout the axillary and appendicular skeleton. This is favored a physiologic marrow response from chronic anemia rather than diffuse metastatic disease. 2. Several discrete foci of more intense activity noted in the spine which could indicate malignancy or trauma. Difficult to define malignancy on the background of diffuse marrow activity. 3. Diffuse sclerosis throughout the bones similar to a recent CT  scans but new from remote PET-CT scan 03/01/2016. 4. No evidence soft tissue metastasis.     04/02/2018 Imaging    CT Angio CAP 04/02/18  IMPRESSION: 1. Multiple new hepatic lesions highly concerning for developing metastatic disease to the liver. This could be confirmed with nonemergent MRI of the abdomen with and without IV gadolinium if clinically appropriate. 2. Widespread metastatic disease to the bones also appears progressive compared to prior examinations. 3. Large right-sided pelvic deep venous thrombosis extending from the right common femoral vein to the proximal right external iliac vein. IVC filter is in position. No clot identified in the IVC filter at this time. No pulmonary embolism on today's examination. 4. Aortic atherosclerosis. 5. Additional incidental findings, as above.    05/01/2018 -  Anti-estrogen oral therapy    Fulvestrant injections 532m monthly starting 05/01/18 -added oral Piqray 3027mdaily starting this week    06/30/2018 -  Antibody Plan    Monthly Xgeva restarted 06/30/18     07/25/2018 PET scan    PET  IMPRESSION: 1. Marked progression of metastatic disease with extensive tumor burden in the skeleton and liver. Multifocal intramuscular metastatic lesions in the paraspinal musculature and pelvic musculature. 2. The bony lesions include a lytic lesion of the left side of the clivus/skull base. 3. Mild to moderate right hydronephrosis and delayed  renal excretion due to suspected tumor along the right renal pelvis. There is also suspected right perirenal tumor posteriorly, and a suspected small metastatic lesion to the left adrenal gland. 4. Other imaging findings of potential clinical significance: Aortic Atherosclerosis (ICD10-I70.0). Mild cardiomegaly. An IVC filter is in place.     CURRENT THERAPY:  -Aranesp injection every 2 weeks -B12 injectionsmonthly  -Lovenox 199m q12h, changed to 1090mevery 12 hours in early Feb 2020at home(adjusted  based on her weight) -Monthlyfulvestrant injection50070mtarting on 2/8/20with loading dose for first month. Monthly starting 06/27/18.  -Monthly Xgevarestarted4/6/20  INTERVAL HISTORY: Ms. ScoSebringturns for follow up. She was rescheduled from last week, she did not feel well and could not come to her visit. Since last f/u on 08/04/18 she has been declining functionally. She is very weak and fatigued. She has an aid 3 hours per day who helps with ADLs. She requires more care. She can not do much for herself. Limits the amount she is out of bed when she's alone. 2 days ago she fell, hurting her ribs, and laid on the floor 4 hours. Pain is 10/10 with movement. EMS came but she declined ED transport due to fear of [id number] coccyx is sore from scooting on the ground. She is not keeping much po or meds down. Taking meds induces coughing which then makes her vomit. Also constipated, no BM in 5 days. Cough is productive with yellow to brown sputum. Denies fever or chills.    MEDICAL HISTORY:  Past Medical History:  Diagnosis Date  . Anxiety   . Arthritis    hands, knees, hips  . Asthma   . Bronchitis   . Cancer ([place] . Cervical stenosis (uterine cervix)   . Disorder of appendix    Enlarged  . Dyspnea on exertion   . GERD (gastroesophageal reflux disease)   . Headache(784.0)   . History of breast cancer    2010--  [place]--  NO RECURRENCE  . History of cervical dysplasia   . Hyperlipidemia   . Hypertension   . Malignant neoplasm of overlapping sites of left breast in female, estrogen receptor positive ([place]/01/2013  . OSA (obstructive sleep apnea) moderate osa per study  09/2010   CPAP  , NOT USING ON REGULAR BASIS  . Pelvic pain in female   . Positive H. pylori test    08-05-2013  . Positive TB test    AS TE[place]--  [place]/ MEDS  . Refusal of blood transfusions as patient is Jehovah's Witness   . Seasonal allergies   . Uterine fibroid   .  Wears glasses     SURGICAL HISTORY: Past Surgical History:  Procedure Laterality Date  . APPENDECTOMY  y  . CARDIOVASCULAR STRESS TEST  10-29-2012   low risk perfusion study/  no significant reversibity/ ef 66%/  normal wall motion  . [place] W/[place]   Procedure: LAPAROSCOPIC CHOLECYSTECTOMY;  Surgeon: ArmRalene OkD;  [facility];  Laterality: N/A;  . COLONOSCOPY  2011   normal per patient - [place]  . DILATION AND CURETTAGE OF UTERUS  10/15/2011   Procedure: DILATATION AND CURETTAGE;  Surgeon: ThoMelina SchoolsD;  [facility];  Service: Gynecology;  Laterality: N/A;  Conization &  endocervical curettings  . EXAMINATION UNDER ANESTHESIA N/A 08/20/2013   Procedure: EXAM UNDER ANESTHESIA;  Surgeon: Margarette Asal, MD;  Location: [facility];  Service: Gynecology;  Laterality: N/A;  . [place]   right  . IR IVC FILTER PLMT / S&I /IMG GUID/MOD SED  03/01/2018  . IR RADIOLOGIST EVAL & MGMT  04/08/2018  . LAPAROSCOPIC APPENDECTOMY N/A 08/22/2017   Procedure: [place];  Surgeon: Coralie Keens, MD;  Location: [facility];  Service: General;  Laterality: N/A;  . LAPAROSCOPIC ASSISTED VAGINAL HYSTERECTOMY N/A 10/26/2014   Procedure: HYSTERECTOMY ABDOMINAL ;  Surgeon: Molli Posey, MD;  Location: [facility];  Service: Gynecology;  Laterality: N/A;  . MASTECTOMY Left 11/2008  in [place]   . SALPINGOOPHORECTOMY Bilateral 10/26/2014   Procedure: BILATERAL SALPINGO OOPHORECTOMY;  Surgeon: Molli Posey, MD;  Location: [facility];  Service: Gynecology;  Laterality: Bilateral;  . TISSUE EXPANDER PLACEMENT Left 08/22/2017   Procedure: REMOVAL OF LEFT TISSUE EXPANDER;  Surgeon: Crissie Reese, MD;  Location: [place];  Service: Plastics;  Laterality: Left;  . TISSUE EXPANDER REMOVAL Left 08/22/2017  . TRANSTHORACIC ECHOCARDIOGRAM  10-29-2012   mild lvh/  ef  60-65%/  grade II diastolic dysfunction/  trivial mr  &  tr    I have reviewed the social history and family history with the patient and they are unchanged from previous note.  ALLERGIES:  is allergic to lisinopril-hydrochlorothiazide; adhesive [tape]; fentanyl; gabapentin; hctz [hydrochlorothiazide]; latex; lisinopril; losartan potassium; other; oxycodone; and tums [calcium carbonate antacid].  MEDICATIONS:  Current Outpatient Medications  Medication Sig Dispense Refill  . acetaminophen (TYLENOL) 500 MG tablet Take 500 mg by mouth every 6 (six) hours as needed for mild pain.    . carvedilol (COREG) 12.5 MG tablet Take 1 tablet (12.5 mg total) by mouth 2 (two) times daily with a meal. 60 tablet 2  . cetirizine (ZYRTEC) 10 MG tablet Take 10 mg by mouth daily as needed for allergies or rhinitis.   11  . docusate sodium (COLACE) 100 MG capsule Take 1 capsule (100 mg total) by mouth daily as needed for mild constipation. (Patient taking differently: Take 200 mg by mouth every other day. ) 10 capsule 0  . doxycycline (VIBRAMYCIN) 100 MG capsule Take 1 capsule (100 mg total) by mouth 2 (two) times daily. 14 capsule 0  . enoxaparin (LOVENOX) 100 MG/ML injection 100 mg 2 (two) times daily.    . feeding supplement, ENSURE ENLIVE, (ENSURE ENLIVE) LIQD Take 237 mLs by mouth 2 (two) times daily between meals. (Patient taking differently: Take 237 mLs by mouth See admin instructions. Drink 237 ml's by mouth two to three times a day (Non-dairy Ensure)) 237 mL 12  . hydrALAZINE (APRESOLINE) 10 MG tablet Take 1 tablet (10 mg total) by mouth 3 (three) times daily. 90 tablet 0  . ipratropium-albuterol (DUONEB) 0.5-2.5 (3) MG/3ML SOLN INHALE THREE MLS VIA NEBULIZER EVERY 6 HOURS AS NEEDED 360 mL 5  . traMADol (ULTRAM) 50 MG tablet Take 2 tablets (100 mg total) by mouth every 6 (six) hours. (Patient taking differently: Take 50 mg by mouth every 6 (six) hours as needed for moderate pain or severe pain. ) 30 tablet 0    No current facility-administered medications for this visit.     PHYSICAL EXAMINATION: ECOG PERFORMANCE STATUS: 4 - Bedbound  Vitals:   09/01/18 1512  BP: 116/69  Pulse: (!) 102  Resp: 18  Temp: 98.2 F (36.8 C)  SpO2: 100%   Filed Weights    GENERAL: ill appearing  but alert, no distress. Presents in wheelchair  SKIN: no obvious rash  EYES: scleral icterus  LUNGS: respirations even, unlabored  HEART:  no lower extremity edema ABDOMEN:abdomen round, slightly tender - exam limited due to wheelchair  Musculoskeletal: clubbing  NEURO: alert & oriented x 3 with fluent speech, generalized weakness Limited exam for covid19 outbreak   LABORATORY DATA:  I have reviewed the data as listed CBC Latest Ref Rng & Units 09/01/2018 08/04/2018 07/28/2018  WBC 4.0 - 10.5 K/uL 4.6 4.4 4.1  Hemoglobin 12.0 - 15.0 g/dL 8.0(L) 7.1(L) 6.9(LL)  Hematocrit 36.0 - 46.0 % 27.8(L) 24.9(L) 24.9(L)  Platelets 150 - 400 K/uL 63(L) 141(L) 143(L)     CMP Latest Ref Rng & Units 09/01/2018 08/04/2018 06/30/2018  Glucose 70 - 99 mg/dL 107(H) 111(H) 124(H)  BUN 6 - 20 mg/dL 21(H) 13 17  Creatinine 0.44 - 1.00 mg/dL 1.17(H) 0.90 1.13(H)  Sodium 135 - 145 mmol/L 133(L) 134(L) 137  Potassium 3.5 - 5.1 mmol/L 4.2 4.2 3.8  Chloride 98 - 111 mmol/L 102 105 105  CO2 22 - 32 mmol/L 19(L) 19(L) 21(L)  Calcium 8.9 - 10.3 mg/dL 12.0(H) 9.5 12.5(H)  Total Protein 6.5 - 8.1 g/dL 6.7 7.2 7.6  Total Bilirubin 0.3 - 1.2 mg/dL 4.8(HH) 1.5(H) 1.2  Alkaline Phos 38 - 126 U/L 485(H) 431(H) 213(H)  AST 15 - 41 U/L 224(HH) 88(H) 54(H)  ALT 0 - 44 U/L 45(H) 37 21      RADIOGRAPHIC STUDIES: I have personally reviewed the radiological images as listed and agreed with the findings in the report. Dg Ribs Bilateral W/chest  Result Date: 09/01/2018 CLINICAL DATA:  Bilateral rib pain after fall yesterday. EXAM: BILATERAL RIBS AND CHEST - 4+ VIEW COMPARISON:  PET-CT dated Jul 25, 2018. Chest and right rib x-rays dated June 05, 2018. FINDINGS: No definite new rib fracture. Subacute right fourth through eighth and tenth through twelfth rib fractures again noted. Subacute left sixth and ninth through eleventh rib fractures again noted. Diffuse osseous metastatic disease is unchanged. The heart size and mediastinal contours are within normal limits. Normal pulmonary vascularity. No focal consolidation, pleural effusion, or pneumothorax. Prior cholecystectomy. IVC filter. IMPRESSION: 1. Bilateral subacute pathologic rib fractures due to underlying diffuse osseous metastatic disease, similar to prior PET-CT. No definite new rib fracture. 2.  No active cardiopulmonary disease. Electronically Signed   By: Titus Dubin M.D.   On: 09/01/2018 15:08     ASSESSMENT & PLAN: Michaela Little is a 58 y.o. female with   1. 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 -she developed bone metastasis 1 year after she stoppinganastrozole.She was treated withAI and Ibrance.Ibrance wasstopped in 11/2017 due to severe anemia. She also stoppedexemestane in late November 2019 herself.  -Due to her significant disease progression on fulvestrant, Dr. Burr Medico recommended to add Piqray  -Due to side effect profile she declined for now and wanted to think about it  -her last fulvestrant injection was 06/30/18, she continued aranesp and xgeva on 08/04/18.   2.Bone metastasis, hypercalcemiasecondary to her malignancy -She was previously on Xgeva, stopped when she developed a severe anemia. She received a Zometa on1/21/2020 for severe hypercalcemiaand again on 06/03/18. -Restarted monthlyXgeva on 06/30/18. Next injection today  -Continue to monitorrenal function andhypercalcemia.  3.Severe anemia  -likely secondary to Ibrance and diffuse bone metastasis  - nadir hgb 3.9  in 11/2017  -She is a Jehovah witness, repeatedly refused blood transfusion -continue 570m injections every 2 weeks, B12 injection monthly  4. Right LE DVT, PE -She was hospitalized on 02/27/18 for multiple PE. This is likely related to metastatic cancer, immobility and Aranesp injections.Patient wishes to continue Aranesp injection despite the risk of thrombosis. -She iscurrentlyon Lovenox 100 mg every 12 hoursat home, continue.  5. Cough and Dyspnea -f/u with pulmonologist Dr. WMelvyn Novas-on Flovent,Cough improved with Hycodan   6. Body pain -related to bone metastasis, aggressive coughing, and recent fall.   7. Afib - hospitalized on 05/30/18 for atrial fibrillation, on coreg   8. Goal of care discussion - treatment goal is palliative, pt does have living will and POA  Over the last month Michaela Little's performance status has declined significantly with generalized weakness, fall, cough, n/v/c, and decreased po intake. She is not able to manage well at home. She underwent ribs/chest xray today due to her fall which shows multiple subacute bilateral pathologic fractures to ribs, without definite new fracture. No active cardiopulmonary disease. Labs reviewed, Hgb 8.0, plt 63K. She is clinically juandiced, she developed marked transaminitis AST 224, ALT 45, ALK PHOS 485, TBILI 4.8, likely related to her liver metastasis. Ms. SStrauchwas seen with Dr. FBurr Medicowho recommends admission for symptom management and to evaluate transaminitis and hyperbilirubinemia. The patient agrees with the plan. Her daughter was notified.   Peripheral IV and NS started in the exam room pending hospital admission.   Plan -Dr. FBurr Medicodiscussed with hospitalist, pt transported to ED for work up and admission   All questions were answered. The patient knows to call the clinic with any problems, questions or concerns. No barriers to learning was detected.    LAlla Feeling NP 09/01/18   Addendum   I have seen the patient, examined her. I agree with the assessment and and plan and have edited the notes.   Michaela Little is doing poorly overall, has developed more symptoms and can not live home independently. [facility] probably hospital admission for failure of thrive, and liver failure secondary to diffuse liver metastasis. We have previously discussed hospice and comfort care but she declined repeated. She also does not want to go to rehab. We may consult hospice for residential hospice if she is open to that.   I will f/u her in the hospital on Wednesday. [facility]   I called her daughter SBrunetta Generaand left a VM about her admission and encourage her to call me back.   YTruitt Merle 09/01/2018

## 2018-09-01 NOTE — ED Provider Notes (Signed)
[facility] DEPT Provider Note   CSN: [id number] Arrival date & time: 09/01/18  1714    History   Chief Complaint Chief Complaint  Patient presents with   Weakness   Constipation    HPI Michaela Little is a 58 y.o. female.     Patient with metastatic breast cancer.  She presented to the oncology office today profound weakness and falling down.  She was sent over here for work-up and admission to the hospital and palliative care  The history is provided by the patient and medical records.  Weakness  Severity:  Moderate Onset quality:  Gradual Timing:  Constant Progression:  Worsening Chronicity:  Recurrent Context: alcohol use   Relieved by:  Nothing Worsened by:  Nothing Ineffective treatments:  None tried Associated symptoms: abdominal pain   Associated symptoms: no chest pain, no cough, no diarrhea, no frequency, no headaches and no seizures   Constipation  Associated symptoms: abdominal pain   Associated symptoms: no back pain and no diarrhea     Past Medical History:  Diagnosis Date   Anxiety    Arthritis    hands, knees, hips   Asthma    Bronchitis    Cancer ([place])    Cervical stenosis (uterine cervix)    Disorder of appendix    Enlarged   Dyspnea on exertion    GERD (gastroesophageal reflux disease)    Headache(784.0)    History of breast cancer    2010--  LEFT  s/p  mastectomy (in [place]) AND CHEMORADIATION--  NO RECURRENCE   History of cervical dysplasia    Hyperlipidemia    Hypertension    Malignant neoplasm of overlapping sites of left breast in female, estrogen receptor positive ([place]) 03/05/2013   OSA (obstructive sleep apnea) moderate osa per study  09/2010   CPAP  , NOT USING ON REGULAR BASIS   Pelvic pain in female    Positive H. pylori test    08-05-2013   Positive TB test    AS TEEN--  [place] W/ MEDS   Refusal of blood transfusions as patient is Jehovah's Witness    Seasonal allergies     Uterine fibroid    Wears glasses     Patient Active Problem List   Diagnosis Date Noted   Vulvar lesion 08/15/2018   Dysplasia of cervix 07/15/2018   Non-toxic nodular goiter 07/15/2018   Osteoarthrosis involving lower leg 07/15/2018   Tear of medial meniscus of knee joint 07/15/2018   Thrombocytopenia, unspecified ([place]) 07/01/2018   Palliative care encounter    Atrial fibrillation with RVR ([place]) 05/30/2018   New onset a-fib ([place]) 05/30/2018   Malnutrition of moderate degree 05/04/2018   Hypercalcemia 04/18/2018   Metastatic breast cancer (H[facility])    Pulmonary embolism ([place]) 02/27/2018   Acute deep vein thrombosis (DVT) of proximal vein of right lower extremity ([place]) 01/22/2018   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   Patient is Jehovah's Witness    Refusal of blood transfusions as patient is Jehovah's Witness    High risk medication use 09/25/2017   Lower extremity edema 09/25/2017   S/P laparoscopic appendectomy 08/22/2017   Goals of care, counseling/discussion 01/20/2017   Multinodular thyroid 02/08/2016   Lumbar back pain with radiculopathy affecting left lower extremity 02/08/2016   Carcinoma of breast metastatic to bone ([place]) 02/08/2016   Bone metastases ([place]) 11/13/2015   Pain, cancer 06/04/2015   Glaucoma suspect of right eye 04/18/2015   Allergic rhinitis due to pollen 06/30/2014  History of left mastectomy 01/15/2014   History of uterine fibroid 01/15/2014   Advance directive on file 01/14/2014   Essential hypertension 12/24/2013   Left ventricular diastolic dysfunction with preserved systolic function 40/12/2723   Amblyopia, both eyes 07/29/2013   Cataract 07/29/2013   High myopia 07/29/2013   Amblyopia 07/29/2013   Morbid (severe) obesity due to excess calories ([place]) 06/23/2013   GERD (gastroesophageal reflux disease) 03/25/2013   Constipation 03/25/2013   Other and unspecified hyperlipidemia 03/25/2013   Malignant  neoplasm of overlapping sites of left breast in female, estrogen receptor positive ([place]) 03/05/2013   Hyperlipidemia 11/13/2011   Dysplasia of cervix, low grade ([facility] 1) 36/64/4034   Diastolic dysfunction 74/25/9563   Obesity 03/04/2011   OSA (obstructive sleep apnea) 02/27/2011   Asthma 02/08/2011    Past Surgical History:  Procedure Laterality Date   APPENDECTOMY  y   CARDIOVASCULAR STRESS TEST  10-29-2012   low risk perfusion study/  no significant reversibity/ ef 66%/  normal wall motion   CERVICAL CONIZATION W/BX  2012   in  [place] 05/14/2012   Procedure: LAPAROSCOPIC CHOLECYSTECTOMY;  Surgeon: Ralene Ok, MD;  Location: [facility];  Service: General;  Laterality: N/A;   COLONOSCOPY  2011   normal per patient - [place]  10/15/2011   Procedure: DILATATION AND CURETTAGE;  Surgeon: Melina Schools, MD;  Location: [facility];  Service: Gynecology;  Laterality: N/A;  Conization &  endocervical curettings   EXAMINATION UNDER ANESTHESIA N/A 08/20/2013   Procedure: EXAM UNDER ANESTHESIA;  Surgeon: Margarette Asal, MD;  Location: [facility];  Service: Gynecology;  Laterality: N/A;   EYE SURGERY  1961   right   IR IVC FILTER PLMT / S&I /IMG GUID/MOD SED  03/01/2018   IR RADIOLOGIST EVAL & MGMT  04/08/2018   LAPAROSCOPIC APPENDECTOMY N/A 08/22/2017   Procedure: APPENDECTOMY LAPAROSCOPIC ERAS PATHWAY;  Surgeon: Coralie Keens, MD;  Location: [facility];  Service: General;  Laterality: N/A;   LAPAROSCOPIC ASSISTED VAGINAL HYSTERECTOMY N/A 10/26/2014   Procedure: HYSTERECTOMY ABDOMINAL ;  Surgeon: Molli Posey, MD;  Location: [facility];  Service: Gynecology;  Laterality: N/A;   MASTECTOMY Left 11/2008  in [place]    SALPINGOOPHORECTOMY Bilateral 10/26/2014   Procedure: BILATERAL SALPINGO OOPHORECTOMY;  Surgeon: Molli Posey, MD;  Location: [facility];  Service: Gynecology;  Laterality: Bilateral;   TISSUE  EXPANDER PLACEMENT Left 08/22/2017   Procedure: REMOVAL OF LEFT TISSUE EXPANDER;  Surgeon: Crissie Reese, MD;  Location: [facility];  Service: Plastics;  Laterality: Left;   TISSUE EXPANDER REMOVAL Left 08/22/2017   TRANSTHORACIC ECHOCARDIOGRAM  10-29-2012   mild lvh/  ef 60-65%/  grade II diastolic dysfunction/  trivial mr  &  tr     OB History    Gravida  10   Para  5   Term  5   Preterm      AB  5   Living  5     SAB  3   TAB  2   Ectopic      Multiple      Live Births               Home Medications    Prior to Admission medications   Medication Sig Start Date End Date Taking? Authorizing Provider  carvedilol (COREG) 6.25 MG tablet Take 6.25 mg by mouth 2 (two) times daily with a meal.  08/13/18  Yes [provider]  feeding supplement, ENSURE ENLIVE, (ENSURE ENLIVE) LIQD Take 237 mLs by mouth 2 (two) times daily between meals. Patient taking differently: Take 237 mLs by mouth See admin instructions. Drink 237 ml's by mouth two to three times a day (Non-dairy Ensure) 04/28/18  Yes Hosie Poisson, MD  ondansetron (ZOFRAN) 8 MG tablet Take 8 mg by mouth every 8 (eight) hours as needed for nausea or vomiting.  08/19/18  Yes [provider]  acetaminophen (TYLENOL) 500 MG tablet Take 500 mg by mouth every 6 (six) hours as needed for mild pain.    [provider]  carvedilol (COREG) 12.5 MG tablet Take 1 tablet (12.5 mg total) by mouth 2 (two) times daily with a meal. Patient not taking: Reported on 09/01/2018 08/14/18   Ladell Pier, MD  cetirizine (ZYRTEC) 10 MG tablet Take 10 mg by mouth daily as needed for allergies or rhinitis.  12/19/17   [provider]  docusate sodium (COLACE) 100 MG capsule Take 1 capsule (100 mg total) by mouth daily as needed for mild constipation. Patient taking differently: Take 200 mg by mouth every other day.  04/28/18   Hosie Poisson, MD  doxycycline (VIBRAMYCIN) 100 MG capsule Take 1 capsule (100 mg total)  by mouth 2 (two) times daily. 08/15/18   Anyanwu, Sallyanne Havers, MD  hydrALAZINE (APRESOLINE) 10 MG tablet Take 1 tablet (10 mg total) by mouth 3 (three) times daily. Patient not taking: Reported on 09/01/2018 04/22/18   Kayleen Memos, DO  ipratropium-albuterol (DUONEB) 0.5-2.5 (3) MG/3ML SOLN INHALE THREE MLS VIA NEBULIZER EVERY 6 HOURS AS NEEDED Patient taking differently: Inhale 3 mLs into the lungs every 6 (six) hours as needed (sob and wheezing).  02/13/18   Clent Demark, PA-C  traMADol (ULTRAM) 50 MG tablet Take 2 tablets (100 mg total) by mouth every 6 (six) hours. Patient taking differently: Take 50 mg by mouth every 6 (six) hours as needed for moderate pain or severe pain.  03/08/18   Cristal Ford, DO    Family History Family History  Problem Relation Age of Onset   Breast cancer Mother    Colon cancer Mother    Hypotension Mother    Asthma Mother    Diabetes type II Mother    Arthritis Mother    Clotting disorder Mother    Cancer Mother        breast/colon   Mental illness Brother    Heart disease Brother    Cerebral palsy Daughter    Emphysema Brother        never smoker   Colon cancer Maternal Aunt    Cancer Maternal Aunt        colon   Colon cancer Maternal Uncle    Cancer Maternal Uncle        colon   Esophageal cancer Neg Hx    Rectal cancer Neg Hx    Stomach cancer Neg Hx    Thyroid disease Neg Hx     Social History Social History   Tobacco Use   Smoking status: Former Smoker    Packs/day: 0.30    Years: 10.00    Pack years: 3.00    Types: Cigarettes    Last attempt to quit: 03/26/1988    Years since quitting: 30.4   Smokeless tobacco: Never Used  Substance Use Topics   Alcohol use: No   Drug use: No     Allergies   Lisinopril-hydrochlorothiazide; Adhesive [tape]; Fentanyl;  Gabapentin; Hctz [hydrochlorothiazide]; Latex; Lisinopril; Losartan potassium; Other; Oxycodone; and Tums [calcium carbonate antacid]   Review of  Systems Review of Systems  Constitutional: Negative for appetite change and fatigue.  HENT: Negative for congestion, ear discharge and sinus pressure.   Eyes: Negative for discharge.  Respiratory: Negative for cough.   Cardiovascular: Negative for chest pain.  Gastrointestinal: Positive for abdominal pain and constipation. Negative for diarrhea.  Genitourinary: Negative for frequency and hematuria.  Musculoskeletal: Negative for back pain.  Skin: Negative for rash.  Neurological: Positive for weakness. Negative for seizures and headaches.  Psychiatric/Behavioral: Negative for hallucinations.     Physical Exam Updated Vital Signs BP 127/79    Pulse 93    Temp 98.7 F (37.1 C) (Oral)    Resp 19    Ht 5\' 7"  (1.702 m)    Wt 83.9 kg    LMP 02/25/2009    SpO2 100%    BMI 28.98 kg/m   Physical Exam Vitals signs and nursing note reviewed.  Constitutional:      Appearance: Normal appearance. She is well-developed.  HENT:     Head: Normocephalic.     Nose: Nose normal.  Eyes:     General: No scleral icterus.    Conjunctiva/sclera: Conjunctivae normal.  Neck:     Musculoskeletal: Neck supple.     Thyroid: No thyromegaly.  Cardiovascular:     Rate and Rhythm: Normal rate and regular rhythm.     Heart sounds: No murmur. No friction rub. No gallop.   Pulmonary:     Breath sounds: No stridor. No wheezing or rales.  Chest:     Chest wall: No tenderness.  Abdominal:     General: There is distension.     Tenderness: There is abdominal tenderness. There is no rebound.  Musculoskeletal: Normal range of motion.  Lymphadenopathy:     Cervical: No cervical adenopathy.  Skin:    Findings: No erythema or rash.  Neurological:     Mental Status: She is alert and oriented to person, place, and time.     Motor: No abnormal muscle tone.     Coordination: Coordination normal.  Psychiatric:        Behavior: Behavior normal.      ED Treatments / Results  Labs (all labs ordered are  listed, but only abnormal results are displayed) Labs Reviewed  SARS CORONAVIRUS 2 (HOSPITAL ORDER, [facility])    EKG None  Radiology Ct Abdomen Pelvis Wo Contrast  Result Date: 09/01/2018 CLINICAL DATA:  58 year old female with stage IV breast cancer. Has stopped all cancer treatment. Abdominal distension, increased fatigue. EXAM: CT ABDOMEN AND PELVIS WITHOUT CONTRAST TECHNIQUE: Multidetector CT imaging of the abdomen and pelvis was performed following the standard protocol without IV contrast. COMPARISON:  PET-CT 07/25/2018.  CT Abdomen and Pelvis 05/03/2018. FINDINGS: Lower chest: Negative lung bases. No pericardial or pleural effusion. Hepatobiliary: Hepatomegaly with diffuse metastatic infiltration of the liver. Liver size has substantially increased since February, cc liver length is 24 centimeters now, 18 centimeters previously. Surgically absent gallbladder. Pancreas: Negative. Spleen: Spleen size is at the upper limits of normal, estimated splenic volume 388 milliliters (normal splenic volume range 83 - 412 mL). No discrete splenic lesion in the absence of IV contrast. Adrenals/Urinary Tract: Stable adrenal glands. There is new right hydronephrosis and right renal atrophy since February, but no right hydroureter. Punctate right lower pole nephrolithiasis. There is mild left hydronephrosis but also no hydroureter. 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 Unremarkable urinary bladder. Stomach/Bowel: No dilated large or small bowel loops. Distal small bowel appears chronically somewhat matted together in the lower abdomen (on series 2, image 68), unchanged. Evidence of prior appendectomy. Decreased retained stool in the colon compared to February. Trace free fluid in both gutters. There may also be trace free fluid in the distal small bowel mesentery. No free air. Negative stomach. Vascular/Lymphatic: Vascular patency is not evaluated in the  absence of IV contrast. Mild Calcified aortic atherosclerosis. Stable IVC filter. Reproductive: Surgically absent uterus. Diminutive or absent ovaries. Other: No pelvic free fluid. Musculoskeletal: Mixed lytic and sclerotic bone metastases throughout the entire visible skeleton. Stable visible vertebral height and alignment since February. No pathologic pelvic or proximal femur fracture identified. There are intermittent bilateral pathologic nondisplaced rib fractures. IMPRESSION: 1. Severe metastatic disease to the liver with hepatomegaly and progression since February. Liver length has increased from 18 to 24 cm since that time. 2. Right greater than left hydronephrosis with a degree of right renal atrophy is new since February. 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 3. Trace abdominal free fluid. No bowel obstruction or inflammation. 4. Diffuse skeletal metastatic disease. Bilateral pathologic nondisplaced rib fractures. Electronically Signed   By: Genevie Ann M.D.   On: 09/01/2018 20:37   Dg Ribs Bilateral W/chest  Result Date: 09/01/2018 CLINICAL DATA:  Bilateral rib pain after fall yesterday. EXAM: BILATERAL RIBS AND CHEST - 4+ VIEW COMPARISON:  PET-CT dated Jul 25, 2018. Chest and right rib x-rays dated June 05, 2018. FINDINGS: No definite new rib fracture. Subacute right fourth through eighth and tenth through twelfth rib fractures again noted. Subacute left sixth and ninth through eleventh rib fractures again noted. Diffuse osseous metastatic disease is unchanged. The heart size and mediastinal contours are within normal limits. Normal pulmonary vascularity. No focal consolidation, pleural effusion, or pneumothorax. Prior cholecystectomy. IVC filter. IMPRESSION: 1. Bilateral subacute pathologic rib fractures due to underlying diffuse osseous metastatic disease, similar to prior PET-CT. No definite new rib fracture. 2.  No active cardiopulmonary disease. Electronically Signed   By:  Titus Dubin M.D.   On: 09/01/2018 15:08    Procedures Procedures (including critical care time)  Medications Ordered in ED Medications  morphine 4 MG/ML injection 4 mg (0 mg Intravenous Hold 09/01/18 1855)  sodium chloride 0.9 % bolus 500 mL (0 mLs Intravenous Stopped 09/01/18 2105)     Initial Impression / Assessment and Plan / ED Course  I have reviewed the triage vital signs and the nursing notes.  Pertinent labs & imaging results that were available during my care of the patient were reviewed by me and considered in my medical decision making (see chart for details).        With severe metastatic breast cancer.  She also has worsening metastases to her liver.  She will be admitted for comfort care and palliative care consult  Final Clinical Impressions(s) / ED Diagnoses   Final diagnoses:  Weakness    ED Discharge Orders    None       Milton Ferguson, MD 09/01/18 2140

## 2018-09-01 NOTE — Telephone Encounter (Signed)
Received call from patient.  She is calling to say that she had a fall over the weekend. EMS was called but pt declined to go to ED-fearful of Covid 19. She states she hurt her ribs and hip. She thinks she might have fractured her ribs. She states she is having pain in her back and hips after the fall.   She is also asking to have her daughter present during today's visit. Advised pt that we still have visitor restrictions d/t covid 19 pandemic but that her provider can speak with daughter via the phone during the visit. Pt is agreeable to that.

## 2018-09-01 NOTE — Progress Notes (Signed)
I was called upfront to see this patient today as part of her intake screening.  She reports that she had a cough.  She states that she has a history of bronchitis and believes that this has recurred.  She also reports having rib pain after having a fall at home this weekend.  This was discussed with Dr. Burr Medico.  Michaela Little was sent for a chest x-ray and rib films.  Following that she will be seen by [name], NP.  Sandi Mealy, MHS, PA-C Physician Assistant

## 2018-09-01 NOTE — ED Notes (Signed)
Pt transported to CT ?

## 2018-09-01 NOTE — ED Notes (Signed)
ED TO INPATIENT HANDOFF REPORT  Name/Age/Gender Michaela Little 58 y.o. female  Code Status Code Status History    Date Active Date Inactive Code Status Order ID Comments User Context   05/30/2018 1646 06/06/2018 1714 Full Code [id number]  Barb Merino, MD Inpatient   05/03/2018 0242 05/08/2018 2154 Full Code [id number]  Rise Patience, MD ED   04/25/2018 0541 04/28/2018 2118 Full Code [id number]  Rise Patience, MD ED   04/18/2018 1753 04/22/2018 2015 Full Code [id number]  Kayleen Memos, DO Inpatient   02/27/2018 2216 03/08/2018 1806 Full Code [id number]  Merton Border, MD Inpatient   01/02/2018 0127 01/04/2018 1637 Full Code [id number]  Shela Leff, MD ED   08/22/2017 1714 08/23/2017 1502 Full Code [id number]  Coralie Keens, MD Inpatient   10/26/2014 1215 10/28/2014 1749 Full Code [id number]  Molli Posey, MD Inpatient   10/01/2012 2334 10/02/2012 2028 Full Code [id number]  Delfina Redwood, MD Inpatient   03/05/2011 0323 03/06/2011 1804 Full Code [id number]  Leanora Ivanoff, RN Inpatient      Home/SNF/Other Home  Chief Complaint ca center  Level of Care/Admitting Diagnosis ED Disposition    ED Disposition Condition [place]: [facility] [[id number]]  Level of Care: Med-Surg [16]  Covid Evaluation: N/A  Diagnosis: Constipation [103013]  Admitting Physician: Toy Baker [[id number]]  Attending Physician: Toy Baker [[id number]]  PT Class (Do Not Modify): Observation [104]  PT Acc Code (Do Not Modify): Observation [10022]       Medical History Past Medical History:  Diagnosis Date  . Anxiety   . Arthritis    hands, knees, hips  . Asthma   . Bronchitis   . Cancer ([place])   . Cervical stenosis (uterine cervix)   . Disorder of appendix    Enlarged  . Dyspnea on exertion   . GERD (gastroesophageal reflux disease)   . Headache(784.0)   . History of breast cancer    2010--  LEFT  s/p  mastectomy (in [place]) AND  CHEMORADIATION--  NO RECURRENCE  . History of cervical dysplasia   . Hyperlipidemia   . Hypertension   . Malignant neoplasm of overlapping sites of left breast in female, estrogen receptor positive ([place]) 03/05/2013  . OSA (obstructive sleep apnea) moderate osa per study  09/2010   CPAP  , NOT USING ON REGULAR BASIS  . Pelvic pain in female   . Positive H. pylori test    08-05-2013  . Positive TB test    AS TEEN--  [place] W/ MEDS  . Refusal of blood transfusions as patient is [employer]hov[employer]'s [employer]   . Seasonal allergies   . Uterine fibroid   . Wears glasses     Allergies Allergies  Allergen Reactions  . Lisinopril-Hydrochlorothiazide Itching  . Adhesive [Tape] Itching and Other (See Comments)    Redness  . Fentanyl Other (See Comments)    GI upset and drowsiness. Only to [facility]  . Gabapentin Other (See Comments)    Auditory hallucinations  . Hctz [Hydrochlorothiazide] Itching  . Latex Itching  . Lisinopril Itching  . Losartan Potassium Other (See Comments)    Makes her feel "bad"   . Other Other (See Comments)    Patient refuses blood for religious reasons (per her notes)  . Oxycodone     GI upset, drowsy  . Tums [Calcium Carbonate Antacid] Hives    FRUIT-FLAVORED ONES    IV Location/Drains/Wounds Patient Lines/Drains/Airways Status   Active Line/Drains/Airways  Name:   Placement date:   Placement time:   Site:   Days:   Peripheral IV 09/01/18 Right Forearm   09/01/18    -    Forearm   less than 1   Peripheral IV 09/01/18 Right;Upper Arm   09/01/18    1939    Arm   less than 1   External Urinary Catheter   06/03/18    1517    -   90          Labs/Imaging Results for orders placed or performed in visit on 09/01/18 (from the past 48 hour(s))  CMP ([place] only)     Status: Abnormal   Collection Time: 09/01/18  2:47 PM  Result Value Ref Range   Sodium 133 (L) 135 - 145 mmol/L   Potassium 4.2 3.5 - 5.1 mmol/L   Chloride 102 98 - 111 mmol/L   CO2 19 (L)  22 - 32 mmol/L   Glucose, Bld 107 (H) 70 - 99 mg/dL   BUN 21 (H) 6 - 20 mg/dL   Creatinine 1.17 (H) 0.44 - 1.00 mg/dL   Calcium 12.0 (H) 8.9 - 10.3 mg/dL   Total Protein 6.7 6.5 - 8.1 g/dL   Albumin 2.7 (L) 3.5 - 5.0 g/dL   AST 224 (HH) 15 - 41 U/L    Comment: CRITICAL RESULT CALLED TO, READ BACK BY AND VERIFIED WITH: AMY SHAKLETON AT 1548.RB    ALT 45 (H) 0 - 44 U/L   Alkaline Phosphatase 485 (H) 38 - 126 U/L   Total Bilirubin 4.8 (HH) 0.3 - 1.2 mg/dL    Comment: CRITICAL RESULT CALLED TO, READ BACK BY AND VERIFIED WITH: AMY SHAKLETON AT 1548.RB    GFR, Est Non Af Am 51 (L) >60 mL/min   GFR, Est AFR Am 59 (L) >60 mL/min   Anion gap 12 5 - 15    Comment: Performed at [facility], [place] [address] [place], [place] [address]  CBC with Differential ([place] Only)     Status: Abnormal   Collection Time: 09/01/18  2:47 PM  Result Value Ref Range   WBC Count 4.6 4.0 - 10.5 K/uL   RBC 2.93 (L) 3.87 - 5.11 MIL/uL   Hemoglobin 8.0 (L) 12.0 - 15.0 g/dL   HCT 27.8 (L) 36.0 - 46.0 %   MCV 94.9 80.0 - 100.0 fL   MCH 27.3 26.0 - 34.0 pg   MCHC 28.8 (L) 30.0 - 36.0 g/dL   RDW 20.5 (H) 11.5 - 15.5 %   Platelet Count 63 (L) 150 - 400 K/uL   nRBC 13.0 (H) 0.0 - 0.2 %   Neutrophils Relative % 55 %   Neutro Abs 2.5 1.7 - 7.7 K/uL   Lymphocytes Relative 31 %   Lymphs Abs 1.5 0.7 - 4.0 K/uL   Monocytes Relative 9 %   Monocytes Absolute 0.4 0.1 - 1.0 K/uL   Eosinophils Relative 1 %   Eosinophils Absolute 0.0 0.0 - 0.5 K/uL   Basophils Relative 1 %   Basophils Absolute 0.1 0.0 - 0.1 K/uL   Immature Granulocytes 3 %   Abs Immature Granulocytes 0.13 (H) 0.00 - 0.07 K/uL    Comment: Performed at [facility], [place] [address] [place], [place] [address]  Hemoglobin A1c     Status: Abnormal   Collection Time: 09/01/18  2:47 PM  Result Value Ref Range   Hgb A1c MFr Bld 4.7 (L) 4.8 -  5.6 %    Comment: (NOTE) Pre diabetes:           5.7%-6.4% Diabetes:              >6.4% Glycemic control for   <7.0% adults with diabetes    Mean Plasma Glucose 88.19 mg/dL    Comment: Performed at [facility] [address] [place], [place] [address]   *Note: Due to a large number of results and/or encounters for the requested time period, some results have not been displayed. A complete set of results can be found in Results Review.   Ct Abdomen Pelvis Wo Contrast  Result Date: 09/01/2018 CLINICAL DATA:  58 year old female with stage IV breast cancer. Has stopped all cancer treatment. Abdominal distension, increased fatigue. EXAM: CT ABDOMEN AND PELVIS WITHOUT CONTRAST TECHNIQUE: Multidetector CT imaging of the abdomen and pelvis was performed following the standard protocol without IV contrast. COMPARISON:  PET-CT 07/25/2018.  CT Abdomen and Pelvis 05/03/2018. FINDINGS: Lower chest: Negative lung bases. No pericardial or pleural effusion. Hepatobiliary: Hepatomegaly with diffuse metastatic infiltration of the liver. Liver size has substantially increased since February, cc liver length is 24 centimeters now, 18 centimeters previously. Surgically absent gallbladder. Pancreas: Negative. Spleen: Spleen size is at the upper limits of normal, estimated splenic volume 388 milliliters (normal splenic volume range 83 - 412 mL). No discrete splenic lesion in the absence of IV contrast. Adrenals/Urinary Tract: Stable adrenal glands. There is new right hydronephrosis and right renal atrophy since February, but no right hydroureter. Punctate right lower pole nephrolithiasis. There is mild left hydronephrosis but also no hydroureter. 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 Unremarkable urinary bladder. Stomach/Bowel: No dilated large or small bowel loops. Distal small bowel appears chronically somewhat matted together in the lower abdomen (on series 2, image 68), unchanged. Evidence of prior appendectomy. Decreased retained  stool in the colon compared to February. Trace free fluid in both gutters. There may also be trace free fluid in the distal small bowel mesentery. No free air. Negative stomach. Vascular/Lymphatic: Vascular patency is not evaluated in the absence of IV contrast. Mild Calcified aortic atherosclerosis. Stable IVC filter. Reproductive: Surgically absent uterus. Diminutive or absent ovaries. Other: No pelvic free fluid. Musculoskeletal: Mixed lytic and sclerotic bone metastases throughout the entire visible skeleton. Stable visible vertebral height and alignment since February. No pathologic pelvic or proximal femur fracture identified. There are intermittent bilateral pathologic nondisplaced rib fractures. IMPRESSION: 1. Severe metastatic disease to the liver with hepatomegaly and progression since February. Liver length has increased from 18 to 24 cm since that time. 2. Right greater than left hydronephrosis with a degree of right renal atrophy is new since February. 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 3. Trace abdominal free fluid. No bowel obstruction or inflammation. 4. Diffuse skeletal metastatic disease. Bilateral pathologic nondisplaced rib fractures. Electronically Signed   By: Genevie Ann M.D.   On: 09/01/2018 20:37   Dg Ribs Bilateral W/chest  Result Date: 09/01/2018 CLINICAL DATA:  Bilateral rib pain after fall yesterday. EXAM: BILATERAL RIBS AND CHEST - 4+ VIEW COMPARISON:  PET-CT dated Jul 25, 2018. Chest and right rib x-rays dated June 05, 2018. FINDINGS: No definite new rib fracture. Subacute right fourth through eighth and tenth through twelfth rib fractures again noted. Subacute left sixth and ninth through eleventh rib fractures again noted. Diffuse osseous metastatic disease is unchanged. The heart size and mediastinal contours are within normal limits. Normal pulmonary  vascularity. No focal consolidation, pleural effusion, or pneumothorax. Prior cholecystectomy. IVC  filter. IMPRESSION: 1. Bilateral subacute pathologic rib fractures due to underlying diffuse osseous metastatic disease, similar to prior PET-CT. No definite new rib fracture. 2.  No active cardiopulmonary disease. Electronically Signed   By: Titus Dubin M.D.   On: 09/01/2018 15:08    Pending Labs Unresulted Labs (From admission, onward)    Start     Ordered   09/01/18 2152  Alexander  Once,   R     09/01/18 2151   09/01/18 2049  SARS Coronavirus 2 (CEPHEID - Performed in [facility] hospital lab), [facility] Order  Once,   R    Question:  Rule Out  Answer:  Yes   09/01/18 2048   Signed and Held  Magnesium  Tomorrow morning,   R    Comments:  Call MD if <1.5    Signed and Held   Signed and Held  Phosphorus  Tomorrow morning,   R     Signed and Held   Signed and Held  TSH  Once,   R    Comments:  Cancel if already done within 1 month and notify MD    Signed and Held   Signed and Held  Comprehensive metabolic panel  Once,   R    Comments:  Cal MD for K<3.5 or >5.0    Signed and Held   Signed and Held  CBC  Once,   R    Comments:  Call for hg <8.0    Signed and Held          Vitals/Pain Today's Vitals   09/01/18 1922 09/01/18 1941 09/01/18 2000 09/01/18 2100  BP:   115/73 127/79  Pulse:   98 93  Resp:   18 19  Temp:      TempSrc:      SpO2:   100% 100%  Weight:  83.9 kg    Height:  5\' 7"  (1.702 m)    PainSc: 0-No pain       Isolation Precautions No active isolations  Medications Medications  morphine 4 MG/ML injection 4 mg (0 mg Intravenous Hold 09/01/18 1855)  sodium chloride 0.9 % bolus 500 mL (0 mLs Intravenous Stopped 09/01/18 2105)    Mobility walks with person assist

## 2018-09-01 NOTE — ED Notes (Addendum)
Pt transported to CT ?

## 2018-09-01 NOTE — ED Triage Notes (Signed)
Patient presents to [facility] from [facility]. Patient has history of left breast cancer with mets to bone/liver. Patient has stopped all cancer treatments but still being followed by [facility] Dr. Luis Abed. Patient has history of severe anemia but reports to be a [employer] and refuses all blood products. Patient lives alone with home health aide 3 hours/day but does not feel safe to take care of herself. Patient Reported to cancer center today with complaints of increased fatigue and constipation with last BM x5 days ago. Patient is passing gas per Cancer Center MD. Per report from [facility] MD, patient will preferably be admitted and hospice will be discussed with patient while inpatient.

## 2018-09-01 NOTE — H&P (Signed)
Michaela Little CBU:[id number] DOB: 1960-05-09 DOA: 09/01/2018     PCP: Ladell Pier, MD   Outpatient Specialists:     Pulmonary    Dr.Wert, Christena Deem, MD  Oncology    Dr. Burr Medico    Patient arrived to ER on 09/01/18 at 1714  Patient coming from: home Lives alone,   Chief Complaint:  Chief Complaint  Patient presents with   Weakness   Constipation    HPI: Michaela Little is a 58 y.o. 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,Now spread to the liver,, pelvic DVT and multiple PE  status post IVC filter currently on Lovenox ,  hydronephrosis secondary to tumor burden Asthma, GERD, HLD, HTN sleep apnea on CPAP she is Jehovah witness does not wish to have transfusions   Had an episode of atrial fibrillation in March she is already on anticoagulation and takes Coreg  Presented with   generalized fatigue was evaluated by her oncologist and asked to come to emergency department Patient has been declining functionally with progressive metastatic breast cancer she has a that comes out of her house 3 hours every day but requires more and more care. She had a fall few days ago and had to lay down on the floor for at least 4 hours and now has been hurting everywhere she moves. She is unsure if she hit her head. She reports her walker got broken as result of a fall  Had decreased p.o. intake and having trouble swallowing her medications With no bowel movement for the past 5 days has had a cough productive of yellow sputum no fevers or chills  She went to see her oncologist today labs were done Showing hemoglobin of 8.0 which is stable platelets down to 63 Total bilirubin elevated to 4.8 With elevated LFTs somewhat up from prior  She has been coughing very thick mucus and that leads to vomiting   Oncology recommended for patient to be evaluated for elevated LFTs and be admitted for symptom management And Palliative care consult  Infectious risk  factors:  Reports  shortness of breath, dry cough,     In RAPID COVID TEST NEGATIVE     Regarding pertinent Chronic problems:   Breast cancer initially diagnosed in 2010   post neoadjuvant chemotherapy, radiation and mastectomy Oral therapy from 2011-2016 And unfortunately progressed to metastases of iliac bone found in 2017 -her last fulvestrant injection was 06/30/18, she continued aranesp and xgeva on 08/04/18.  Bone metastasis,   -She was previously on Xgeva, stopped when she developed a severe anemia. She received a Zometa on1/21/2020 for severe hypercalcemiaand again on 06/03/18. -Restarted monthlyXgeva on 06/30/18. Next injection today      Hyperlipidemia - not on statins   HTN on coreg, hydralazine  CHF diastolic- last echo July  2019 ejection fraction was in the range of 60% to 03%  grade 2 diastolic dysfunction     Asthma -well controlled on home inhalers          OSA -not on CPAP    While in ER:  The following Work up has been ordered so far:  Orders Placed This Encounter  Procedures   SARS Coronavirus 2 (CEPHEID - Performed in [facility] hospital lab), Hosp Order   CT ABDOMEN PELVIS WO CONTRAST   CT HEAD WO CONTRAST   DG Abd 1 View   Protime-INR   Magnesium   Phosphorus   TSH   Comprehensive  metabolic panel   CBC   Diet Heart Room service appropriate? Yes; Fluid consistency: Thin   Vital signs   Notify physician   Up with assistance   If patient diabetic or glucose greater than 140 notify physician for Sliding Scale Insulin Orders   May go off telemetry for tests/procedures   Oral care per nursing protocol   Initiate Oral Care Protocol   Initiate Carrier Fluid Protocol   RN may order General Admission PRN Orders utilizing "General Admission PRN medications" (through manage orders) for the following patient needs: allergy symptoms (Claritin), cold sores (Carmex), cough (Robitussin DM), eye irritation (Liquifilm Tears), hemorrhoids  (Tucks), indigestion (Maalox), minor skin irritation (Hydrocortisone Cream), muscle pain Suezanne Jacquet Gay), nose irritation (saline nasal spray) and sore throat (Chloraseptic spray).   Patient has an active order for admit to inpatient/place in observation   Place and maintain sequential compression device   Do not attempt resuscitation (DNR)   Consult to hospitalist  ALL PATIENTS BEING ADMITTED/HAVING PROCEDURES NEED COVID-19 SCREENING   Nutritional services consult   Consult to Palliative Care   OT eval and treat   PT eval and treat   Pulse oximetry check with vital signs   Oxygen therapy Mode or (Route): Nasal cannula; Liters Per Minute: 2; Keep 02 saturation: greater than 92 %   Incentive spirometry   Incentive spirometry RT   EKG 12-Lead   Place in observation (patient's expected length of stay will be less than 2 midnights)     Following Medications were ordered in ER: Medications  morphine 4 MG/ML injection 4 mg (0 mg Intravenous Hold 09/01/18 1855)  traMADol (ULTRAM) tablet 50 mg (has no administration in time range)  carvedilol (COREG) tablet 6.25 mg (has no administration in time range)  docusate sodium (COLACE) capsule 200 mg (has no administration in time range)  ipratropium-albuterol (DUONEB) 0.5-2.5 (3) MG/3ML nebulizer solution 3 mL (has no administration in time range)  acetaminophen (TYLENOL) tablet 650 mg (has no administration in time range)    Or  acetaminophen (TYLENOL) suppository 650 mg (has no administration in time range)  ondansetron (ZOFRAN) tablet 4 mg (has no administration in time range)    Or  ondansetron (ZOFRAN) injection 4 mg (has no administration in time range)  0.9 %  sodium chloride infusion (has no administration in time range)  senna (SENOKOT) tablet 8.6 mg (has no administration in time range)  polyethylene glycol (MIRALAX / GLYCOLAX) packet 17 g (has no administration in time range)  bisacodyl (DULCOLAX) suppository 10 mg (has no  administration in time range)  morphine 2 MG/ML injection 2-4 mg (has no administration in time range)  sodium chloride 0.9 % bolus 500 mL (0 mLs Intravenous Stopped 09/01/18 2105)        Consult Orders  (From admission, onward)         Start     Ordered   09/01/18 2301  PT eval and treat  Routine     09/01/18 2300   09/01/18 2301  OT eval and treat  Routine    Question:  Reason for OT?  Answer:  safe discharge   09/01/18 2300   09/01/18 2156  Nutritional services consult  Once    Provider:  (Not yet assigned)  Question:  Reason for Consult?  Answer:  malnutrition   09/01/18 2157   09/01/18 2156  Consult to Palliative Care  Once    Provider:  (Not yet assigned)  Question Answer Comment  Palliative Care Consult Services Palliative  Medicine Consult   Reason for Consult? goals of care      09/01/18 2157   09/01/18 2047  Consult to hospitalist  ALL PATIENTS BEING ADMITTED/HAVING PROCEDURES NEED COVID-19 SCREENING  Once    Comments:  ALL PATIENTS BEING ADMITTED/HAVING PROCEDURES NEED COVID-19 SCREENING  Provider:  (Not yet assigned)  Question Answer Comment  Place call to: [facility],  call [phone]   Reason for Consult Admit      09/01/18 2046           Significant initial  Findings: Abnormal Labs Reviewed - No abnormal labs to display   Otherwise labs showing:    Recent Labs  Lab 09/01/18 1447  NA 133*  K 4.2  CO2 19*  GLUCOSE 107*  BUN 21*  CREATININE 1.17*  CALCIUM 12.0*    Cr   stable,   Lab Results  Component Value Date   CREATININE 1.17 (H) 09/01/2018   CREATININE 0.90 08/04/2018   CREATININE 1.13 (H) 06/30/2018    Recent Labs  Lab 09/01/18 1447  AST 224*  ALT 45*  ALKPHOS 485*  BILITOT 4.8*  PROT 6.7  ALBUMIN 2.7*   Lab Results  Component Value Date   CALCIUM 12.0 (H) 09/01/2018   PHOS 4.2 04/18/2018     WBC      Component Value Date/Time   WBC 4.6 09/01/2018 1447   WBC 4.1 07/28/2018 0920   ANC    Component Value  Date/Time   NEUTROABS 2.5 09/01/2018 1447   NEUTROABS 1.0 (L) 12/31/2017 1154   NEUTROABS 1.5 03/29/2017 1048   ALC No results found for: LYMPHOABS    Plt: Lab Results  Component Value Date   PLT 63 (L) 09/01/2018    Lactic Acid, Venous    Component Value Date/Time   LATICACIDVEN 1.2 05/03/2018 1400        COVID-19 Labs    Lab Results  Component Value Date   SARSCOV2NAA NEGATIVE 09/01/2018     HG/HCT stable,       Component Value Date/Time   HGB 8.0 (L) 09/01/2018 1447   HGB 4.2 (LL) 12/31/2017 1154   HGB 11.8 03/29/2017 1048   HCT 27.8 (L) 09/01/2018 1447   HCT 13.1 (LL) 12/31/2017 1154   HCT 36.0 03/29/2017 1048       BNP (last 3 results) Recent Labs    01/01/18 1838 02/27/18 1237 05/02/18 2221  BNP 25.8 15.6 39.3    ProBNP (last 3 results) No results for input(s): PROBNP in the last 8760 hours.  DM  labs:  HbA1C: Recent Labs    04/18/18 1846 08/04/18 0937 09/01/18 1447  HGBA1C 5.0 4.9 4.7*       CBG (last 3)  No results for input(s): GLUCAP in the last 72 hours.     UA  not ordered    CTabd/pelvis -  Mets to the liver, R.L hydronephrosis, diffuse skeletal mets    ECG: not ordered    ED Triage Vitals  Enc Vitals Group     BP 09/01/18 1727 120/71     Pulse Rate 09/01/18 1727 (!) 103     Resp 09/01/18 1727 18     Temp 09/01/18 1727 98.7 F (37.1 C)     Temp Source 09/01/18 1727 Oral     SpO2 09/01/18 1727 100 %     Weight 09/01/18 1941 185 lb (83.9 kg)     Height 09/01/18 1941 5\' 7"  (1.702 m)     Head Circumference --  Peak Flow --      Pain Score 09/01/18 1922 0     Pain Loc --      Pain Edu? --      Excl. in [place]? --   TMAX(24)@       Latest  Blood pressure 115/73, pulse 98, temperature 98.7 F (37.1 C), temperature source Oral, resp. rate 18, height 5\' 7"  (1.702 m), weight 83.9 kg, last menstrual period 02/25/2009, SpO2 100 %.     Hospitalist was called for admission for pain mangement   Review of Systems:     Pertinent positives include:  fatigue, weight loss   chest pain ,falls,   non-productive cough, Constitutional:  No weight loss, night sweats, Fevers, chills, HEENT:  No headaches, Difficulty swallowing,Tooth/dental problems,Sore throat,  No sneezing, itching, ear ache, nasal congestion, post nasal drip,  Cardio-vascular:  No, Orthopnea, PND, anasarca, dizziness, palpitations.no Bilateral lower extremity swelling  GI:  No heartburn, indigestion, abdominal pain, nausea, vomiting, diarrhea, change in bowel habits, loss of appetite, melena, blood in stool, hematemesis Resp:  no shortness of breath at rest. No dyspnea on exertion, No excess mucus, no productive cough, N No coughing up of blood.No change in color of mucus.No wheezing. Skin:  no rash or lesions. No jaundice GU:  no dysuria, change in color of urine, no urgency or frequency. No straining to urinate.  No flank pain.  Musculoskeletal:  No joint pain or no joint swelling. No decreased range of motion. No back pain.  Psych:  No change in mood or affect. No depression or anxiety. No memory loss.  Neuro: no localizing neurological complaints, no tingling, no weakness, no double vision, no gait abnormality, no slurred speech, no confusion  All systems reviewed and apart from [place] all are negative  Past Medical History:   Past Medical History:  Diagnosis Date   Anxiety    Arthritis    hands, knees, hips   Asthma    Bronchitis    Cancer (HCC)    Cervical stenosis (uterine cervix)    Disorder of appendix    Enlarged   Dyspnea on exertion    GERD (gastroesophageal reflux disease)    Headache(784.0)    History of breast cancer    2010--  LEFT  s/p  mastectomy (in [place]) AND CHEMORADIATION--  NO RECURRENCE   History of cervical dysplasia    Hyperlipidemia    Hypertension    Malignant neoplasm of overlapping sites of left breast in female, estrogen receptor positive ([place]) 03/05/2013   OSA (obstructive sleep  apnea) moderate osa per study  09/2010   CPAP  , NOT USING ON REGULAR BASIS   Pelvic pain in female    Positive H. pylori test    08-05-2013   Positive TB test    AS TEEN--  [place] W/ MEDS   Refusal of blood transfusions as patient is Jehovah's Witness    Seasonal allergies    Uterine fibroid    Wears glasses       Past Surgical History:  Procedure Laterality Date   APPENDECTOMY  y   CARDIOVASCULAR STRESS TEST  10-29-2012   low risk perfusion study/  no significant reversibity/ ef 66%/  normal wall motion   CERVICAL CONIZATION W/BX  2012   in  [place] 05/14/2012   Procedure: LAPAROSCOPIC CHOLECYSTECTOMY;  Surgeon: Ralene Ok, MD;  Location: [facility];  Service: General;  Laterality: N/A;   COLONOSCOPY  2011   normal per patient -  [place]   DILATION AND CURETTAGE OF UTERUS  10/15/2011   Procedure: DILATATION AND CURETTAGE;  Surgeon: Melina Schools, MD;  Location: [facility];  Service: Gynecology;  Laterality: N/A;  Conization &  endocervical curettings   EXAMINATION UNDER ANESTHESIA N/A 08/20/2013   Procedure: EXAM UNDER ANESTHESIA;  Surgeon: Margarette Asal, MD;  Location[facility];  Service: Gynecology;  Laterality: N/A;   EYE SURGERY  1961   right   IR IVC FILTER PLMT / S&I /IMG GUID/MOD SED  03/01/2018   IR RADIOLOGIST EVAL & MGMT  04/08/2018   LAPAROSCOPIC APPENDECTOMY N/A 08/22/2017   Procedure: APPENDECTOMY LAPAROSCOPIC ERAS PATHWAY;  Surgeon: Coralie Keens, MD;  Location: [facility];  Service: General;  Laterality: N/A;   LAPAROSCOPIC ASSISTED VAGINAL HYSTERECTOMY N/A 10/26/2014   Procedure: HYSTERECTOMY ABDOMINAL ;  Surgeon: Molli Posey, MD;  Location: [facility];  Service: Gynecology;  Laterality: N/A;   MASTECTOMY Left 11/2008  in [place]    SALPINGOOPHORECTOMY Bilateral 10/26/2014   Procedure: BILATERAL SALPINGO OOPHORECTOMY;  Surgeon: Molli Posey, MD;  Location: [facility];  Service: Gynecology;  Laterality:  Bilateral;   TISSUE EXPANDER PLACEMENT Left 08/22/2017   Procedure: REMOVAL OF LEFT TISSUE EXPANDER;  Surgeon: Crissie Reese, MD;  Location: [facility];  Service: Plastics;  Laterality: Left;   TISSUE EXPANDER REMOVAL Left 08/22/2017   TRANSTHORACIC ECHOCARDIOGRAM  10-29-2012   mild lvh/  ef 60-65%/  grade II diastolic dysfunction/  trivial mr  &  tr    Social History:  Ambulatory  walker      reports that she quit smoking about 30 years ago. Her smoking use included cigarettes. She has a 3.00 pack-year smoking history. She has never used smokeless tobacco. She reports that she does not drink alcohol or use drugs.     Family History:   Family History  Problem Relation Age of Onset   Breast cancer Mother    Colon cancer Mother    Hypotension Mother    Asthma Mother    Diabetes type II Mother    Arthritis Mother    Clotting disorder Mother    Cancer Mother        breast/colon   Mental illness Brother    Heart disease Brother    Cerebral palsy Daughter    Emphysema Brother        never smoker   Colon cancer Maternal Aunt    Cancer Maternal Aunt        colon   Colon cancer Maternal Uncle    Cancer Maternal Uncle        colon   Esophageal cancer Neg Hx    Rectal cancer Neg Hx    Stomach cancer Neg Hx    Thyroid disease Neg Hx     Allergies: Allergies  Allergen Reactions   Lisinopril-Hydrochlorothiazide Itching   Adhesive [Tape] Itching and Other (See Comments)    Redness   Fentanyl Other (See Comments)    GI upset and drowsiness. Only to [facility]   Gabapentin Other (See Comments)    Auditory hallucinations   Hctz [Hydrochlorothiazide] Itching   Latex Itching   Lisinopril Itching   Losartan Potassium Other (See Comments)    Makes her feel "bad"    Other Other (See Comments)    Patient refuses blood for religious reasons (per her notes)   Oxycodone     GI upset, drowsy   Tums [Calcium Carbonate Antacid] Hives  FRUIT-FLAVORED ONES     Prior to Admission medications   Medication Sig Start Date End Date Taking? Authorizing Provider  carvedilol (COREG) 6.25 MG tablet Take 6.25 mg by mouth 2 (two) times daily with a meal.  08/13/18  Yes [provider]  feeding supplement, ENSURE ENLIVE, (ENSURE ENLIVE) LIQD Take 237 mLs by mouth 2 (two) times daily between meals. Patient taking differently: Take 237 mLs by mouth See admin instructions. Drink 237 ml's by mouth two to three times a day (Non-dairy Ensure) 04/28/18  Yes Hosie Poisson, MD  ondansetron (ZOFRAN) 8 MG tablet Take 8 mg by mouth every 8 (eight) hours as needed for nausea or vomiting.  08/19/18  Yes [provider]  acetaminophen (TYLENOL) 500 MG tablet Take 500 mg by mouth every 6 (six) hours as needed for mild pain.    [provider]  carvedilol (COREG) 12.5 MG tablet Take 1 tablet (12.5 mg total) by mouth 2 (two) times daily with a meal. Patient not taking: Reported on 09/01/2018 08/14/18   Ladell Pier, MD  cetirizine (ZYRTEC) 10 MG tablet Take 10 mg by mouth daily as needed for allergies or rhinitis.  12/19/17   [provider]  docusate sodium (COLACE) 100 MG capsule Take 1 capsule (100 mg total) by mouth daily as needed for mild constipation. Patient taking differently: Take 200 mg by mouth every other day.  04/28/18   Hosie Poisson, MD  doxycycline (VIBRAMYCIN) 100 MG capsule Take 1 capsule (100 mg total) by mouth 2 (two) times daily. 08/15/18   Anyanwu, Sallyanne Havers, MD  hydrALAZINE (APRESOLINE) 10 MG tablet Take 1 tablet (10 mg total) by mouth 3 (three) times daily. Patient not taking: Reported on 09/01/2018 04/22/18   Kayleen Memos, DO  ipratropium-albuterol (DUONEB) 0.5-2.5 (3) MG/3ML SOLN INHALE THREE MLS VIA NEBULIZER EVERY 6 HOURS AS NEEDED Patient taking differently: Inhale 3 mLs into the lungs every 6 (six) hours as needed (sob and wheezing).  02/13/18   Clent Demark, PA-C  traMADol (ULTRAM) 50 MG  tablet Take 2 tablets (100 mg total) by mouth every 6 (six) hours. Patient taking differently: Take 50 mg by mouth every 6 (six) hours as needed for moderate pain or severe pain.  03/08/18   Cristal Ford, DO   Physical Exam: Blood pressure 115/73, pulse 98, temperature 98.7 F (37.1 C), temperature source Oral, resp. rate 18, height 5\' 7"  (1.702 m), weight 83.9 kg, last menstrual period 02/25/2009, SpO2 100 %. 1. General:  in No  Acute distress   Chronically ill  -appearing 2. Psychological: Alert and  Oriented 3. Head/ENT:      ry Mucous Membranes                          Head Non traumatic, neck supple                            Poor Dentition 4. SKIN:  decreased Skin turgor,  Skin clean Dry and intact no rash 5. Heart: Regular rate and rhythm no  Murmur, no Rub or gallop 6. Lungs:  no wheezes or crackles   7. Abdomen: Soft,  non-tender,   distended  bowel sounds present 8. Lower extremities: no clubbing, cyanosis, no edema 9. Neurologically Grossly intact, moving all 4 extremities equally   10. MSK: Normal range of motion   All other LABS:     Recent Labs  Lab 09/01/18 1447  WBC 4.6  NEUTROABS 2.5  HGB 8.0*  HCT 27.8*  MCV 94.9  PLT 63*     Recent Labs  Lab 09/01/18 1447  NA 133*  K 4.2  CL 102  CO2 19*  GLUCOSE 107*  BUN 21*  CREATININE 1.17*  CALCIUM 12.0*     Recent Labs  Lab 09/01/18 1447  AST 224*  ALT 45*  ALKPHOS 485*  BILITOT 4.8*  PROT 6.7  ALBUMIN 2.7*       Cultures:    Component Value Date/Time   SDES URINE, RANDOM 05/31/2018 0818   SPECREQUEST NONE 05/31/2018 0818   CULT  05/31/2018 0818    NO GROWTH Performed at [facility], [place] [address] [place], [place] [address]    REPTSTATUS 06/01/2018 FINAL 05/31/2018 0818     Radiological Exams on Admission: Ct Abdomen Pelvis Wo Contrast  Result Date: 09/01/2018 CLINICAL DATA:  58 year old female with stage IV breast cancer. Has stopped all cancer treatment. Abdominal  distension, increased fatigue. EXAM: CT ABDOMEN AND PELVIS WITHOUT CONTRAST TECHNIQUE: Multidetector CT imaging of the abdomen and pelvis was performed following the standard protocol without IV contrast. COMPARISON:  PET-CT 07/25/2018.  CT Abdomen and Pelvis 05/03/2018. FINDINGS: Lower chest: Negative lung bases. No pericardial or pleural effusion. Hepatobiliary: Hepatomegaly with diffuse metastatic infiltration of the liver. Liver size has substantially increased since February, cc liver length is 24 centimeters now, 18 centimeters previously. Surgically absent gallbladder. Pancreas: Negative. Spleen: Spleen size is at the upper limits of normal, estimated splenic volume 388 milliliters (normal splenic volume range 83 - 412 mL). No discrete splenic lesion in the absence of IV contrast. Adrenals/Urinary Tract: Stable adrenal glands. There is new right hydronephrosis and right renal atrophy since February, but no right hydroureter. Punctate right lower pole nephrolithiasis. There is mild left hydronephrosis but also no hydroureter. 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 Unremarkable urinary bladder. Stomach/Bowel: No dilated large or small bowel loops. Distal small bowel appears chronically somewhat matted together in the lower abdomen (on series 2, image 68), unchanged. Evidence of prior appendectomy. Decreased retained stool in the colon compared to February. Trace free fluid in both gutters. There may also be trace free fluid in the distal small bowel mesentery. No free air. Negative stomach. Vascular/Lymphatic: Vascular patency is not evaluated in the absence of IV contrast. Mild Calcified aortic atherosclerosis. Stable IVC filter. Reproductive: Surgically absent uterus. Diminutive or absent ovaries. Other: No pelvic free fluid. Musculoskeletal: Mixed lytic and sclerotic bone metastases throughout the entire visible skeleton. Stable visible vertebral height and alignment since  February. No pathologic pelvic or proximal femur fracture identified. There are intermittent bilateral pathologic nondisplaced rib fractures. IMPRESSION: 1. Severe metastatic disease to the liver with hepatomegaly and progression since February. Liver length has increased from 18 to 24 cm since that time. 2. Right greater than left hydronephrosis with a degree of right renal atrophy is new since February. 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 3. Trace abdominal free fluid. No bowel obstruction or inflammation. 4. Diffuse skeletal metastatic disease. Bilateral pathologic nondisplaced rib fractures. Electronically Signed   By: Genevie Ann M.D.   On: 09/01/2018 20:37   Dg Ribs Bilateral W/chest  Result Date: 09/01/2018 CLINICAL DATA:  Bilateral rib pain after fall yesterday. EXAM: BILATERAL RIBS AND CHEST - 4+ VIEW COMPARISON:  PET-CT dated Jul 25, 2018. Chest and right rib x-rays dated June 05, 2018. FINDINGS: No definite  new rib fracture. Subacute right fourth through eighth and tenth through twelfth rib fractures again noted. Subacute left sixth and ninth through eleventh rib fractures again noted. Diffuse osseous metastatic disease is unchanged. The heart size and mediastinal contours are within normal limits. Normal pulmonary vascularity. No focal consolidation, pleural effusion, or pneumothorax. Prior cholecystectomy. IVC filter. IMPRESSION: 1. Bilateral subacute pathologic rib fractures due to underlying diffuse osseous metastatic disease, similar to prior PET-CT. No definite new rib fracture. 2.  No active cardiopulmonary disease. Electronically Signed   By: Titus Dubin M.D.   On: 09/01/2018 15:08   Ct Head Wo Contrast  Result Date: 09/01/2018 CLINICAL DATA:  58 year old female with stage IV breast cancer. Has stopped all cancer treatment. Abdominal distension, increased fatigue.  Fall 2 days ago. EXAM: CT HEAD WITHOUT CONTRAST TECHNIQUE: Contiguous axial images were obtained  from the base of the skull through the vertex without intravenous contrast. COMPARISON:  PET-CT 07/25/2018.  Head CT 02/02/2016. FINDINGS: Brain: Cerebral volume remains normal. No midline shift, ventriculomegaly, mass effect, intracranial hemorrhage or evidence of cortically based acute infarction. Gray-white matter differentiation is within normal limits throughout the brain. No cerebral edema identified. However, there is suspicion of asymmetric dural thickening along the right clinoid process and sphenoid wing (series 2, image 8 and series 5, image 26. there is associated partial effacement of the regional sulci but no significant mass effect. Vascular: Mild Calcified atherosclerosis at the skull base. Skull: Progressed since 2017 and diffuse lytic osseous lesions throughout the visible skull. Prominent osteolysis of the clivus. Sinuses/Orbits: Visualized paranasal sinuses and mastoids are well pneumatized. Other: No acute orbit or scalp soft tissue finding identified. IMPRESSION: 1.  No recent traumatic injury identified. 2. 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 Electronically Signed   By: Genevie Ann M.D.   On: 09/01/2018 22:17    Chart has been reviewed   Assessment/Plan   58 y.o. 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,Now spread to the liver,, pelvic DVT and multiple PE  status post IVC filter currently on Lovenox ,  hydronephrosis secondary to tumor burden Asthma, GERD, HLD, HTN sleep apnea on CPAP she is Jehovah witness does not wish to have transfusions   Had an episode of atrial fibrillation in March she is already on anticoagulation and takes Coreg  Admitted for cancer pain management  Present on Admission: Metastatic breast cancer - will notify Dr. 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  currently states does not want any more shots, palliative care consulted Discussed with oncology Dr. Jana Hakim  Thrombocytopenia in the setting of liver involvement suspect worsening liver function. History of atrial fibrillation in the past continue Coreg given thrombocytopenia overall very poor prognosis we will hold off on initiation of anticoagulation  Other plan as per orders.  DVT prophylaxis:  SCD states would like to avoid shots     Code Status:  DNR/DNI  as per patient   I had personally discussed CODE STATUS with patient   Overall very poor prognosis expecting weeks to months will probably benefit from hospice  Family Communication:   Family not at  Bedside    Disposition Plan:    likely will need placement for rehabilitation                                              Would benefit from PT/OT eval prior to DC  Ordered                    palliative                     Consults called:   email Burr Medico   Admission status:  ED Disposition    ED Disposition Condition Kimberly: [facility] [100102]  Level of Care: Med-Surg [16]  Covid Evaluation: N/A  Diagnosis: Constipation [116579]  Admitting Physician: Toy Baker [[id number]]  Attending Physician: Toy Baker [[id number]]  PT Class (Do Not Modify): Observation [104]  PT Acc Code (Do Not Modify): Observation [10022]      Obs    Level of care         medical floor      Precautions: NONE  No  active isolations  PPE: Used by the provider:   P100  eye Goggles,  Gloves    Herminio Kniskern 09/01/2018, 11:05 PM    [facility]     after 2 AM please page floor coverage PA If 7AM-7PM, please contact the day team taking care of the patient using [email or web address]

## 2018-09-01 NOTE — ED Notes (Signed)
Pt is back from radiology

## 2018-09-01 NOTE — ED Notes (Signed)
Pt and all belongings including clothing, purse and wheelchair taken up to the 3W

## 2018-09-01 NOTE — ED Notes (Signed)
Bed: MD47 Expected date:  Expected time:  Means of arrival:  Comments: [facility] center

## 2018-09-02 ENCOUNTER — Telehealth: Payer: Self-pay | Admitting: Nurse Practitioner

## 2018-09-02 ENCOUNTER — Observation Stay (HOSPITAL_COMMUNITY): Payer: Medicare Other

## 2018-09-02 DIAGNOSIS — Z170 Estrogen receptor positive status [ER+]: Secondary | ICD-10-CM | POA: Diagnosis not present

## 2018-09-02 DIAGNOSIS — Z87891 Personal history of nicotine dependence: Secondary | ICD-10-CM | POA: Diagnosis not present

## 2018-09-02 DIAGNOSIS — C50919 Malignant neoplasm of unspecified site of unspecified female breast: Secondary | ICD-10-CM | POA: Diagnosis not present

## 2018-09-02 DIAGNOSIS — Z9181 History of falling: Secondary | ICD-10-CM | POA: Diagnosis not present

## 2018-09-02 DIAGNOSIS — C7951 Secondary malignant neoplasm of bone: Secondary | ICD-10-CM | POA: Diagnosis present

## 2018-09-02 DIAGNOSIS — Z515 Encounter for palliative care: Secondary | ICD-10-CM | POA: Diagnosis not present

## 2018-09-02 DIAGNOSIS — Z923 Personal history of irradiation: Secondary | ICD-10-CM

## 2018-09-02 DIAGNOSIS — R627 Adult failure to thrive: Secondary | ICD-10-CM | POA: Diagnosis present

## 2018-09-02 DIAGNOSIS — E785 Hyperlipidemia, unspecified: Secondary | ICD-10-CM | POA: Diagnosis present

## 2018-09-02 DIAGNOSIS — I110 Hypertensive heart disease with heart failure: Secondary | ICD-10-CM | POA: Diagnosis present

## 2018-09-02 DIAGNOSIS — G4733 Obstructive sleep apnea (adult) (pediatric): Secondary | ICD-10-CM | POA: Diagnosis present

## 2018-09-02 DIAGNOSIS — D6959 Other secondary thrombocytopenia: Secondary | ICD-10-CM | POA: Diagnosis present

## 2018-09-02 DIAGNOSIS — C787 Secondary malignant neoplasm of liver and intrahepatic bile duct: Principal | ICD-10-CM

## 2018-09-02 DIAGNOSIS — C50812 Malignant neoplasm of overlapping sites of left female breast: Secondary | ICD-10-CM | POA: Diagnosis not present

## 2018-09-02 DIAGNOSIS — D649 Anemia, unspecified: Secondary | ICD-10-CM | POA: Diagnosis not present

## 2018-09-02 DIAGNOSIS — K219 Gastro-esophageal reflux disease without esophagitis: Secondary | ICD-10-CM | POA: Diagnosis present

## 2018-09-02 DIAGNOSIS — K5900 Constipation, unspecified: Secondary | ICD-10-CM

## 2018-09-02 DIAGNOSIS — R531 Weakness: Secondary | ICD-10-CM | POA: Diagnosis present

## 2018-09-02 DIAGNOSIS — Z7189 Other specified counseling: Secondary | ICD-10-CM

## 2018-09-02 DIAGNOSIS — Z1159 Encounter for screening for other viral diseases: Secondary | ICD-10-CM | POA: Diagnosis not present

## 2018-09-02 DIAGNOSIS — D696 Thrombocytopenia, unspecified: Secondary | ICD-10-CM

## 2018-09-02 DIAGNOSIS — Z9012 Acquired absence of left breast and nipple: Secondary | ICD-10-CM

## 2018-09-02 DIAGNOSIS — I480 Paroxysmal atrial fibrillation: Secondary | ICD-10-CM | POA: Diagnosis present

## 2018-09-02 DIAGNOSIS — I82401 Acute embolism and thrombosis of unspecified deep veins of right lower extremity: Secondary | ICD-10-CM

## 2018-09-02 DIAGNOSIS — Z86718 Personal history of other venous thrombosis and embolism: Secondary | ICD-10-CM | POA: Diagnosis not present

## 2018-09-02 DIAGNOSIS — Z6829 Body mass index (BMI) 29.0-29.9, adult: Secondary | ICD-10-CM | POA: Diagnosis not present

## 2018-09-02 DIAGNOSIS — Z531 Procedure and treatment not carried out because of patient's decision for reasons of belief and group pressure: Secondary | ICD-10-CM | POA: Diagnosis not present

## 2018-09-02 DIAGNOSIS — Z86711 Personal history of pulmonary embolism: Secondary | ICD-10-CM | POA: Diagnosis not present

## 2018-09-02 DIAGNOSIS — Z853 Personal history of malignant neoplasm of breast: Secondary | ICD-10-CM | POA: Diagnosis not present

## 2018-09-02 DIAGNOSIS — D630 Anemia in neoplastic disease: Secondary | ICD-10-CM | POA: Diagnosis present

## 2018-09-02 DIAGNOSIS — Z66 Do not resuscitate: Secondary | ICD-10-CM | POA: Diagnosis not present

## 2018-09-02 DIAGNOSIS — Z95828 Presence of other vascular implants and grafts: Secondary | ICD-10-CM | POA: Diagnosis not present

## 2018-09-02 DIAGNOSIS — K7290 Hepatic failure, unspecified without coma: Secondary | ICD-10-CM | POA: Diagnosis present

## 2018-09-02 DIAGNOSIS — I5032 Chronic diastolic (congestive) heart failure: Secondary | ICD-10-CM | POA: Diagnosis present

## 2018-09-02 DIAGNOSIS — Z5329 Procedure and treatment not carried out because of patient's decision for other reasons: Secondary | ICD-10-CM | POA: Diagnosis not present

## 2018-09-02 DIAGNOSIS — Z9221 Personal history of antineoplastic chemotherapy: Secondary | ICD-10-CM

## 2018-09-02 LAB — COMPREHENSIVE METABOLIC PANEL
ALT: 42 U/L (ref 0–44)
AST: 184 U/L — ABNORMAL HIGH (ref 15–41)
Albumin: 2.6 g/dL — ABNORMAL LOW (ref 3.5–5.0)
Alkaline Phosphatase: 365 U/L — ABNORMAL HIGH (ref 38–126)
Anion gap: 8 (ref 5–15)
BUN: 22 mg/dL — ABNORMAL HIGH (ref 6–20)
CO2: 20 mmol/L — ABNORMAL LOW (ref 22–32)
Calcium: 11.1 mg/dL — ABNORMAL HIGH (ref 8.9–10.3)
Chloride: 105 mmol/L (ref 98–111)
Creatinine, Ser: 1.01 mg/dL — ABNORMAL HIGH (ref 0.44–1.00)
GFR calc Af Amer: >60 mL/min (ref >60)
GFR calc non Af Amer: >60 mL/min (ref >60)
Glucose, Bld: 103 mg/dL — ABNORMAL HIGH (ref 70–99)
Potassium: 4.1 mmol/L (ref 3.5–5.1)
Sodium: 133 mmol/L — ABNORMAL LOW (ref 135–145)
Total Bilirubin: 4.5 mg/dL — ABNORMAL HIGH (ref 0.3–1.2)
Total Protein: 5.9 g/dL — ABNORMAL LOW (ref 6.5–8.1)

## 2018-09-02 LAB — PROTIME-INR
INR: 1.3 — ABNORMAL HIGH (ref 0.8–1.2)
Prothrombin Time: 16 seconds — ABNORMAL HIGH (ref 11.4–15.2)

## 2018-09-02 LAB — CBC
HCT: 23.4 % — ABNORMAL LOW (ref 36.0–46.0)
Hemoglobin: 6.7 g/dL — CL (ref 12.0–15.0)
MCH: 27.7 pg (ref 26.0–34.0)
MCHC: 28.6 g/dL — ABNORMAL LOW (ref 30.0–36.0)
MCV: 96.7 fL (ref 80.0–100.0)
Platelets: 49 10*3/uL — ABNORMAL LOW (ref 150–400)
RBC: 2.42 MIL/uL — ABNORMAL LOW (ref 3.87–5.11)
RDW: 20.5 % — ABNORMAL HIGH (ref 11.5–15.5)
WBC: 3.2 10*3/uL — ABNORMAL LOW (ref 4.0–10.5)
nRBC: 11.7 % — ABNORMAL HIGH (ref 0.0–0.2)

## 2018-09-02 LAB — MAGNESIUM: Magnesium: 2 mg/dL (ref 1.7–2.4)

## 2018-09-02 LAB — CANCER ANTIGEN 27.29: CA 27.29: 3720.7 U/mL — ABNORMAL HIGH (ref 0.0–38.6)

## 2018-09-02 LAB — PHOSPHORUS: Phosphorus: 2.5 mg/dL (ref 2.5–4.6)

## 2018-09-02 LAB — TSH: TSH: 3.284 u[IU]/mL (ref 0.350–4.500)

## 2018-09-02 MED ORDER — LACTULOSE 10 GM/15ML PO SOLN
20.0000 g | Freq: Two times a day (BID) | ORAL | Status: DC | PRN
  Administered 2018-09-02: 20 g via ORAL
  Filled 2018-09-02: qty 30

## 2018-09-02 MED ORDER — DARBEPOETIN ALFA 100 MCG/0.5ML IJ SOSY
500.0000 ug | PREFILLED_SYRINGE | Freq: Once | INTRAMUSCULAR | Status: DC
  Filled 2018-09-02 (×2): qty 2.5

## 2018-09-02 MED ORDER — CYANOCOBALAMIN 1000 MCG/ML IJ SOLN
1000.0000 ug | Freq: Once | INTRAMUSCULAR | Status: AC
  Administered 2018-09-02: 1000 ug via INTRAMUSCULAR
  Filled 2018-09-02: qty 1

## 2018-09-02 MED ORDER — ENSURE ENLIVE PO LIQD
237.0000 mL | Freq: Three times a day (TID) | ORAL | Status: DC
  Administered 2018-09-04 – 2018-09-05 (×2): 237 mL via ORAL

## 2018-09-02 MED ORDER — DARBEPOETIN ALFA 300 MCG/0.6ML IJ SOSY
250.0000 ug | PREFILLED_SYRINGE | INTRAMUSCULAR | Status: DC
  Administered 2018-09-02: 250 ug via SUBCUTANEOUS
  Filled 2018-09-02: qty 0.6

## 2018-09-02 MED ORDER — ZOLEDRONIC ACID 4 MG/5ML IV CONC
4.0000 mg | Freq: Once | INTRAVENOUS | Status: AC
  Administered 2018-09-02: 4 mg via INTRAVENOUS
  Filled 2018-09-02: qty 5

## 2018-09-02 NOTE — Progress Notes (Signed)
Initial Nutrition Assessment  RD working remotely.   DOCUMENTATION CODES:   (unable to assess for malnutrition at this time.)  INTERVENTION:  - will order Ensure Enlive TID, each supplement provides 350 kcal and 20 grams of protein. - will order Magic Cup with dinner meals, each supplement provides 290 kcal and 9 grams of protein. - liberalize diet from Heart Healthy to Regular (ok by MD). - continue to encourage intakes.     NUTRITION DIAGNOSIS:   Increased nutrient needs related to chronic illness, catabolic illness, cancer and cancer related treatments as evidenced by estimated needs.  GOAL:   Patient will meet greater than or equal to 90% of their needs  MONITOR:   PO intake, Supplement acceptance, Labs, Weight trends  REASON FOR ASSESSMENT:   Malnutrition Screening Tool, Consult Assessment of nutrition requirement/status  ASSESSMENT:   58 y.o. female with medical history significant of metastatic breast cancer to the bone and liver, pelvic DVT and multiple PEs s/p IVC filter, hydronephrosis 2/2 tumor burden, asthma, GERD, HLD, HTN, and sleep apnea on CPAP. 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 Patient presented to her Oncologist's office and was noted to have generalized fatigue and was sent to the ED. Patient with functional decline 2/2 metastatic cancer and has been requiring increasing levels of care. Patient reported a fall a few days PTA which has led to all-over pain and her walker broke during the fall. Productive cough without fever or chills. Patient admitted for symptom management and Palliative Care consult.    Patient currently on Heart Healthy diet and consumed 0% of breakfast. Able to communicate with MD via secure chat to liberalize diet.   Patient reports ongoing poor appetite for the past few months and that she does better with liquids than solid foods. She likes Ensure supplements and has been drinking them the past few months;  nothing consistently, mainly when she is in the hospital or can afford them or when a free case was provided to her by [facility].   She also reports preference for liquids d/t some recent difficulties with swallowing, specifically when taking pills. Patient reports chronic constipation and that she did not have a BM for ~1 week PTA. Per review of flow sheet, it appears patient reported last BM was on 6/3.   She denies nausea but does report that she has had a cough which is productive of thick mucus and that sometimes coughing has led to vomiting.   Per chart review, current weight is 189 lb and weight on 5/4 was 202 lb. This indicates 13 lb weight loss (6.6% body weight) in the past 1 month; significant for time frame. Highly suspect that patient meets criteria for some degree of malnutrition but unable to confirm at this time.     Medications reviewed; 1 tablet senokot BID, 200 mg colace every other day.  Labs reviewed; Na: 133 mmol/l, BUN: 22 mg/dl, creatinine: 1.01 mg/dl, Ca: 11.1 mg/dl, Alk Phos and AST elevated.     NUTRITION - FOCUSED PHYSICAL EXAM:  unable to complete at this time.   Diet Order:   Diet Order            Diet regular Room service appropriate? Yes; Fluid consistency: Thin  Diet effective now              EDUCATION NEEDS:   Not appropriate for education at this time  Skin:  Skin Assessment: Reviewed RN Assessment  Last BM:  6/3 (PTA)  Height:   Ht Readings from Last 1 Encounters:  09/02/18 5' 7"  (1.702 m)    Weight:   Wt Readings from Last 1 Encounters:  09/02/18 85.6 kg    Ideal Body Weight:  61.4 kg  BMI:  Body mass index is 29.55 kg/m.  Estimated Nutritional Needs:   Kcal:  2250-2500 kcal  Protein:  115-125 grams  Fluid:  >/= 2.2 L/day     Jarome Matin, MS, RD, LDN, [facility] Inpatient Clinical Dietitian Pager # [phone] After hours/weekend pager # [phone]

## 2018-09-02 NOTE — Consult Note (Signed)
Consultation Note Date: 09/02/2018   Patient Name: Michaela Little  DOB: Mar 10, 1961  MRN: [id number]  Age / Sex: 58 y.o., female  PCP: Ladell Pier, MD Referring Physician: Damita Lack, MD  Reason for Consultation: Establishing goals of care  HPI/Patient Profile: 58 y.o. female  with past medical history of metastatic breast cancer to bones and liver, pelvic DVT with multiple pulmonary embolism has IVC filter in place and on Lovenox, hydronephrosis secondary to tumor burden, asthma, GERD, hypertension, hyperlipidemia, Jehovah's Witness  admitted on 09/01/2018 with generalized fatigue and overall decline in her condition and failure to thrive.    .   Clinical Assessment and Goals of Care: Patient has a life limiting illness of advanced metastatic breast cancer.  She has metastatic cancer to her liver and has transaminitis.  She has been admitted with failure to thrive, multifactorial anemia.  Additionally, she has low platelets.  She also has a history of DVT, pulmonary embolism.  Patient has been under the care of Dr. Burr Medico from medical oncology.  She was seen in her clinic shortly prior to her admission this time for failure to thrive as well as advancing disease.  Patient remains admitted to the hospital, under hospital medicine service.  Palliative medicine consultation for goals of care discussions, disposition discussions.  [name] is resting in bed.  She complains of weakness.  She complains of constipation.  She states that she does not specifically hurt 1 place but has generalized discomfort.  She states that she is aware that she has " aggressive cancer."  We discussed about goals, wishes and values important to Ms.[name].  She simply is feeling overwhelmed and states that she would like for me to discuss with her daughter Brunetta Genera over the phone.   Call placed and discussed with daughter.  She  has also discussed with Dr. Burr Medico with oncology recently.  We reviewed the patient's current condition, her underlying serious illness of advanced metastatic breast cancer.  We discussed about next steps, overall goals of care, appropriate disposition options.  I discussed with the patient's daughter in detail about hospice philosophy of care, the difference between home with hospice versus residential hospice was discussed in detail.  Patient's daughter requested that I also give a call to the patient's son Relena Ivancic [phone] who lives in [place].  Call placed and discussed with the patient's son as well.  He wish to know about the patient's current medication list.  This was shared with him.  He states that he was well aware of the patient's current condition and overall decline with the patient having a markedly limited prognosis.  I also discussed with him about options for home with hospice versus residential hospice.  Patient's daughter would like to come visit the patient here at the hospital in order to offer support, to discuss face-to-face with the patient about her wishes.  I reviewed with her current visitor restriction guidelines due to the COVID-19 pandemic.  Further active listening and supportive care.  Palliative medicine  team to continue to follow along.  I did share my medical recommendation with the patient, son, daughter for considering residential hospice.  NEXT OF KIN Patient has 4 kids.  Daughter shakima lives locally [phone].  SUMMARY OF RECOMMENDATIONS:  Agree with DO NOT RESUSCITATE.  Continue current supportive care, pain medicines, bowel regimen.  Extensive, ongoing discussions with patient, son and daughter about different levels of hospice care.  Discussed medical recommendation being for them to consider residential hospice.  Patient has escalating symptom burden, rapidly declining functional status with a markedly limited prognosis, could possibly be 2 weeks  or less for life expectancy, in my opinion.  This was shared frankly but compassionately with patient son and daughter over the phone.  Palliative medicine team to continue to follow along.  Thank you for the consult.  Code Status/Advance Care Planning:  DNR    Symptom Management:   Continue current bowel regimen, pain regimen, antiemetic regimen.  Palliative Prophylaxis:   Bowel Regimen   Psycho-social/Spiritual:   Desire for further Chaplaincy support:yes  Additional Recommendations: Education on Hospice  Prognosis:   < 2 weeks  Discharge Planning: Patient and family to discuss further :recommend hospice facility.       Primary Diagnoses: Present on Admission:  Asthma  GERD (gastroesophageal reflux disease)  Constipation  Essential hypertension  Bone metastases ([facility])  Anemia in neoplastic disease  Pulmonary embolism ([facility])  Metastatic breast cancer ([facility])  Elevated LFTs   I have reviewed the medical record, interviewed the patient and family, and examined the patient. The following aspects are pertinent.  Past Medical History:  Diagnosis Date   Anxiety    Arthritis    hands, knees, hips   Asthma    Bronchitis    Cancer ([place])    Cervical stenosis (uterine cervix)    Disorder of appendix    Enlarged   Dyspnea on exertion    GERD (gastroesophageal reflux disease)    Headache(784.0)    History of breast cancer    2010--  LEFT  s/p  mastectomy (in [place]) AND CHEMORADIATION--  NO RECURRENCE   History of cervical dysplasia    Hyperlipidemia    Hypertension    Malignant neoplasm of overlapping sites of left breast in female, estrogen receptor positive ([place]) 03/05/2013   OSA (obstructive sleep apnea) moderate osa per study  09/2010   CPAP  , NOT USING ON REGULAR BASIS   Pelvic pain in female    Positive H. pylori test    08-05-2013   Positive TB test    [place]--  [place]/ MEDS   Refusal of blood transfusions as patient is  Jehovah's Witness    Seasonal allergies    Uterine fibroid    Wears glasses    Social History   Socioeconomic History   Marital status: Single    Spouse name: Not on file   Number of children: 5   Years of education: Not on file   Highest education level: Not on file  Occupational History   Occupation: Disabled  Scientist, product/process development strain: Not on file   Food insecurity:    Worry: Not on file    Inability: Not on file   Transportation needs:    Medical: Not on file    Non-medical: Not on file  Tobacco Use   Smoking status: Former Smoker    Packs/day: 0.30    Years: 10.00    Pack years: 3.00    Types:  Cigarettes    Last attempt to quit: 03/26/1988    Years since quitting: 30.4   Smokeless tobacco: Never Used  Substance and Sexual Activity   Alcohol use: No   Drug use: No   Sexual activity: Not Currently  Lifestyle   Physical activity:    Days per week: Not on file    Minutes per session: Not on file   Stress: Not on file  Relationships   Social connections:    Talks on phone: Not on file    Gets together: Not on file    Attends religious service: Not on file    Active member of club or organization: Not on file    Attends meetings of clubs or organizations: Not on file    Relationship status: Not on file  Other Topics Concern   Not on file  Social History Narrative   Not on file   Family History  Problem Relation Age of Onset   Breast cancer Mother    Colon cancer Mother    Hypotension Mother    Asthma Mother    Diabetes type II Mother    Arthritis Mother    Clotting disorder Mother    Cancer Mother        breast/colon   Mental illness Brother    Heart disease Brother    Cerebral palsy Daughter    Emphysema Brother        never smoker   Colon cancer Maternal Aunt    Cancer Maternal Aunt        colon   Colon cancer Maternal Uncle    Cancer Maternal Uncle        colon   Esophageal cancer Neg Hx      Rectal cancer Neg Hx    Stomach cancer Neg Hx    Thyroid disease Neg Hx    Scheduled Meds:  carvedilol  6.25 mg Oral BID WC   darbepoetin (ARANESP) injection - NON-DIALYSIS  500 mcg Subcutaneous Once   docusate sodium  200 mg Oral QODAY   feeding supplement (ENSURE ENLIVE)  237 mL Oral TID BM    morphine injection  4 mg Intravenous Once   senna  1 tablet Oral BID   Continuous Infusions: PRN Meds:.acetaminophen **OR** acetaminophen, bisacodyl, ipratropium-albuterol, lactulose, morphine injection, ondansetron **OR** ondansetron (ZOFRAN) IV, polyethylene glycol, traMADol Medications Prior to Admission:  Prior to Admission medications   Medication Sig Start Date End Date Taking? Authorizing Provider  carvedilol (COREG) 6.25 MG tablet Take 6.25 mg by mouth 2 (two) times daily with a meal.  08/13/18  Yes [provider]  feeding supplement, ENSURE ENLIVE, (ENSURE ENLIVE) LIQD Take 237 mLs by mouth 2 (two) times daily between meals. Patient taking differently: Take 237 mLs by mouth See admin instructions. Drink 237 ml's by mouth two to three times a day (Non-dairy Ensure) 04/28/18  Yes Hosie Poisson, MD  ondansetron (ZOFRAN) 8 MG tablet Take 8 mg by mouth every 8 (eight) hours as needed for nausea or vomiting.  08/19/18  Yes [provider]  acetaminophen (TYLENOL) 500 MG tablet Take 500 mg by mouth every 6 (six) hours as needed for mild pain.    [provider]  carvedilol (COREG) 12.5 MG tablet Take 1 tablet (12.5 mg total) by mouth 2 (two) times daily with a meal. Patient not taking: Reported on 09/01/2018 08/14/18   Ladell Pier, MD  cetirizine (ZYRTEC) 10 MG tablet Take 10 mg by mouth daily as needed for allergies  or rhinitis.  12/19/17   [provider]  docusate sodium (COLACE) 100 MG capsule Take 1 capsule (100 mg total) by mouth daily as needed for mild constipation. Patient taking differently: Take 200 mg by mouth every other day.  04/28/18    Hosie Poisson, MD  doxycycline (VIBRAMYCIN) 100 MG capsule Take 1 capsule (100 mg total) by mouth 2 (two) times daily. 08/15/18   Anyanwu, Sallyanne Havers, MD  hydrALAZINE (APRESOLINE) 10 MG tablet Take 1 tablet (10 mg total) by mouth 3 (three) times daily. Patient not taking: Reported on 09/01/2018 04/22/18   Kayleen Memos, DO  ipratropium-albuterol (DUONEB) 0.5-2.5 (3) MG/3ML SOLN INHALE THREE MLS VIA NEBULIZER EVERY 6 HOURS AS NEEDED Patient taking differently: Inhale 3 mLs into the lungs every 6 (six) hours as needed (sob and wheezing).  02/13/18   Clent Demark, PA-C  traMADol (ULTRAM) 50 MG tablet Take 2 tablets (100 mg total) by mouth every 6 (six) hours. Patient taking differently: Take 50 mg by mouth every 6 (six) hours as needed for moderate pain or severe pain.  03/08/18   Cristal Ford, DO   Allergies  Allergen Reactions   Lisinopril-Hydrochlorothiazide Itching   Adhesive [Tape] Itching and Other (See Comments)    Redness   Fentanyl Other (See Comments)    GI upset and drowsiness. Only to [facility]   Gabapentin Other (See Comments)    Auditory hallucinations   Hctz [Hydrochlorothiazide] Itching   Latex Itching   Lisinopril Itching   Losartan Potassium Other (See Comments)    Makes her feel "bad"    Other Other (See Comments)    Patient refuses blood for religious reasons (per her notes)   Oxycodone     GI upset, drowsy   Tums [Calcium Carbonate Antacid] Hives    FRUIT-FLAVORED ONES   Review of Systems Positive for generalized weakness Positive for constipation  Physical Exam Weak appearing lady resting in bed Appears pale Generalized weakness Shallow clear breath sounds S1-S2 Abdomen not distended No edema Appears weak and deconditioned  Vital Signs: BP 104/60 (BP Location: Right Leg)    Pulse 66    Temp 98.4 F (36.9 C) (Oral)    Resp 15    Ht 5\' 7"  (1.702 m)    Wt 85.6 kg    LMP 02/25/2009    SpO2 100%    BMI 29.55 kg/m  Pain Scale:  0-10 POSS *See Group Information*: S-Acceptable,Sleep, easy to arouse Pain Score: 3    SpO2: SpO2: 100 % O2 Device:SpO2: 100 % O2 Flow Rate: .   IO: Intake/output summary:   Intake/Output Summary (Last 24 hours) at 09/02/2018 1544 Last data filed at 09/02/2018 1339 Gross per 24 hour  Intake 1149.92 ml  Output --  Net 1149.92 ml    LBM: Last BM Date: 08/27/18 Baseline Weight: Weight: 83.9 kg Most recent weight: Weight: 85.6 kg     Palliative Assessment/Data:   Flowsheet Rows     Most Recent Value  Intake Tab  Referral Department  Hospitalist  Unit at Time of Referral  Med/Surg Unit  Palliative Care Primary Diagnosis  Cancer  Palliative Care Type  New Palliative care  Reason for referral  Clarify Goals of Care  Date first seen by Palliative Care  09/02/18  Clinical Assessment  Palliative Performance Scale Score  30%  Pain Max last 24 hours  4  Pain Min Last 24 hours  3  Dyspnea Max Last 24 Hours  4  Dyspnea Min Last  24 hours  3  Nausea Max Last 24 Hours  3  Nausea Min Last 24 Hours  2  Anxiety Max Last 24 Hours  4  Anxiety Min Last 24 Hours  3  Psychosocial & Spiritual Assessment  Palliative Care Outcomes  Patient/Family meeting held?  Yes  Who was at the meeting?  Patient, also discussed with daughter over the phone      Time In:  1500 Time Out:  1610 Time Total:  70 min.  Greater than 50%  of this time was spent counseling and coordinating care related to the above assessment and plan.  Signed by: Loistine Chance, MD  [phone] Please contact Palliative Medicine Team phone at [phone] for questions and concerns.  For individual provider: See Shea Evans

## 2018-09-02 NOTE — Progress Notes (Signed)
PT Cancellation Note  Patient Details Name: TIANA SIVERTSON MRN: [id number] DOB: [birth year]   Cancelled Treatment:    Reason Eval/Treat Not Completed: Attempted PT eval-pt declined participation on today 2*not feel well. She is agreeable to PT checking back another day    Weston Anna, [place] Pager: [phone] Office: [phone]

## 2018-09-02 NOTE — Progress Notes (Signed)
OT Cancellation Note  Patient Details Name: Michaela Little MRN: [id number] DOB: [birth year]   Cancelled Treatment:    Pt declined due to not feeling well.  Will check on pt next day.  Kari Baars, OT Acute Rehabilitation Services [phone] Office- [phone]    Bravlio Luca, Edwena Felty D 09/02/2018, 1:10 PM

## 2018-09-02 NOTE — Progress Notes (Signed)
PROGRESS NOTE    Michaela Little  AYT:[id number] DOB: 1960-10-17 DOA: 09/01/2018 PCP: Ladell Pier, MD   Brief Narrative:  58 year old with history of metastatic breast cancer to bones and liver, pelvic DVT with multiple pulmonary embolism has IVC filter in place and on Lovenox, hydronephrosis secondary to tumor burden, asthma, GERD, hypertension, hyperlipidemia, [employer] was sent to the hospital due to generalized fatigue and overall decline in her condition and failure to thrive.  Upon admission she was also found to be anemic but no transfusion.   Assessment & Plan:   Active Problems:   Asthma   Diastolic dysfunction   Malignant neoplasm of overlapping sites of left breast in female, estrogen receptor positive (HCC)   GERD (gastroesophageal reflux disease)   Constipation   Essential hypertension   Bone metastases (HCC)   Anemia in neoplastic disease   Pulmonary embolism (HCC)   Metastatic breast cancer (HCC)   Elevated LFTs  Advanced metastatic breast cancer Failure to thrive, adult -Patient tells me this morning she is currently not interested in any further chemotherapy.  Apparently she is poor prognosis, palliative care management consultation.  Oncology has been added to the treatment team.  Oral diet as tolerated.  Supportive care.  IV fluids as necessary.  Transaminitis -Secondary to metastatic cancer to liver.  Continue to monitor.  No obvious signs of bleeding.  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  No obvious signs of bleeding at the moment.  Does not accept any transfusion.  Hemoglobin today 6.7.  Need to establish a long-term goals of care.  Used to be on Lovenox for her DVTs and PEs.  History of DVTs/IVC filter pulmonary embolism -Self discontinued Lovenox.  Hold off on this until further palliative care discussion has taken place.  History of GERD -Continue home meds as needed  History of asthma  -Stable.  Supportive care.  History of atrial fibrillation, paroxysmal -On Coreg for rate control.  Not on anticoagulation.  DVT prophylaxis: SCDs Code Status: Patient wishes to be DNR/DNI Family Communication: None Disposition Plan: Palliative care and oncology discussion to take place to establish long-term goals of care.  Overall she is poor prognosis given her progressive advanced malignancy.  Consultants:   Palliative care  Oncology  Procedures:   None  Antimicrobials:   None   Subjective: She does not have any complaints this morning besides feeling very fatigued overall.  No obvious signs of bleeding.  Tells me this morning that she may be interested in hospice care.  Review of Systems Otherwise negative except as per HPI, including: General: Denies fever, chills, night sweats or unintended weight loss. Resp: Denies cough, wheezing, shortness of breath. Cardiac: Denies chest pain, palpitations, orthopnea, paroxysmal nocturnal dyspnea. GI: Denies abdominal pain, nausea, vomiting, diarrhea or constipation GU: Denies dysuria, frequency, hesitancy or incontinence MS: Denies muscle aches, joint pain or swelling Neuro: Denies headache, neurologic deficits (focal weakness, numbness, tingling), abnormal gait Psych: Denies anxiety, depression, SI/HI/AVH Skin: Denies new rashes or lesions ID: Denies sick contacts, exotic exposures, travel  Objective: Vitals:   09/01/18 2258 09/02/18 0001 09/02/18 0102 09/02/18 0608  BP: 125/71  133/74 120/65  Pulse: 98  94 90  Resp: 16  18 16   Temp: 98.3 F (36.8 C)  98 F (36.7 C) 97.7 F (36.5 C)  TempSrc: Oral  Oral Oral  SpO2: 100%  100% 100%  Weight:  85.6 kg    Height:  5\' 7"  (1.702 m)  Intake/Output Summary (Last 24 hours) at 09/02/2018 0932 Last data filed at 09/02/2018 0820 Gross per 24 hour  Intake 628.41 ml  Output -  Net 628.41 ml   Filed Weights   09/01/18 1941 09/02/18 0001  Weight: 83.9 kg 85.6 kg     Examination:  General exam: Appears calm and comfortable, appears pale.  Generally very weak appearing.  Dry mouth. Respiratory system: Clear to auscultation. Respiratory effort normal. Cardiovascular system: S1 & S2 heard, RRR. No JVD, murmurs, rubs, gallops or clicks. No pedal edema. Gastrointestinal system: Abdomen is nondistended, soft and nontender. No organomegaly or masses felt. Normal bowel sounds heard. Central nervous system: Alert and oriented. No focal neurological deficits. Extremities: Symmetric 4 x 5 power. Skin: No rashes, lesions or ulcers Psychiatry: Judgement and insight appear normal. Mood & affect appropriate.     Data Reviewed:   CBC: Recent Labs  Lab 09/01/18 1447 09/02/18 0414  WBC 4.6 3.2*  NEUTROABS 2.5  --   HGB 8.0* 6.7*  HCT 27.8* 23.4*  MCV 94.9 96.7  PLT 63* 49*   Basic Metabolic Panel: Recent Labs  Lab 09/01/18 1447 09/02/18 0414  NA 133* 133*  K 4.2 4.1  CL 102 105  CO2 19* 20*  GLUCOSE 107* 103*  BUN 21* 22*  CREATININE 1.17* 1.01*  CALCIUM 12.0* 11.1*  MG  --  2.0  PHOS  --  2.5   GFR: Estimated Creatinine Clearance: 66.6 mL/min (A) (by C-G formula based on SCr of 1.01 mg/dL (H)). Liver Function Tests: Recent Labs  Lab 09/01/18 1447 09/02/18 0414  AST 224* 184*  ALT 45* 42  ALKPHOS 485* 365*  BILITOT 4.8* 4.5*  PROT 6.7 5.9*  ALBUMIN 2.7* 2.6*   No results for input(s): LIPASE, AMYLASE in the last 168 hours. No results for input(s): AMMONIA in the last 168 hours. Coagulation Profile: Recent Labs  Lab 09/02/18 0414  INR 1.3*   Cardiac Enzymes: No results for input(s): CKTOTAL, CKMB, CKMBINDEX, TROPONINI in the last 168 hours. BNP (last 3 results) No results for input(s): PROBNP in the last 8760 hours. HbA1C: Recent Labs    09/01/18 1447  HGBA1C 4.7*   CBG: No results for input(s): GLUCAP in the last 168 hours. Lipid Profile: No results for input(s): CHOL, HDL, LDLCALC, TRIG, CHOLHDL, LDLDIRECT in the last  72 hours. Thyroid Function Tests: Recent Labs    09/02/18 0414  TSH 3.284   Anemia Panel: No results for input(s): VITAMINB12, FOLATE, FERRITIN, TIBC, IRON, RETICCTPCT in the last 72 hours. Sepsis Labs: No results for input(s): PROCALCITON, LATICACIDVEN in the last 168 hours.  Recent Results (from the past 240 hour(s))  SARS Coronavirus 2 (CEPHEID - Performed in [facility] lab), Hosp Order     Status: None   Collection Time: 09/01/18  8:49 PM  Result Value Ref Range Status   SARS Coronavirus 2 NEGATIVE NEGATIVE Final    Comment: (NOTE) If result is NEGATIVE SARS-CoV-2 target nucleic acids are NOT DETECTED. The SARS-CoV-2 RNA is generally detectable in upper and lower  respiratory specimens during the acute phase of infection. The lowest  concentration of SARS-CoV-2 viral copies this assay can detect is 250  copies / mL. A negative result does not preclude SARS-CoV-2 infection  and should not be used as the sole basis for treatment or other  patient management decisions.  A negative result may occur with  improper specimen collection / handling, submission of specimen other  than nasopharyngeal swab, presence of viral  mutation(s) within the  areas targeted by this assay, and inadequate number of viral copies  (<250 copies / mL). A negative result must be combined with clinical  observations, patient history, and epidemiological information. If result is POSITIVE SARS-CoV-2 target nucleic acids are DETECTED. The SARS-CoV-2 RNA is generally detectable in upper and lower  respiratory specimens dur ing the acute phase of infection.  Positive  results are indicative of active infection with SARS-CoV-2.  Clinical  correlation with patient history and other diagnostic information is  necessary to determine patient infection status.  Positive results do  not rule out bacterial infection or co-infection with other viruses. If result is PRESUMPTIVE POSTIVE SARS-CoV-2 nucleic  acids MAY BE PRESENT.   A presumptive positive result was obtained on the submitted specimen  and confirmed on repeat testing.  While 2019 novel coronavirus  (SARS-CoV-2) nucleic acids may be present in the submitted sample  additional confirmatory testing may be necessary for epidemiological  and / or clinical management purposes  to differentiate between  SARS-CoV-2 and other Sarbecovirus currently known to infect humans.  If clinically indicated additional testing with an alternate test  methodology [phone]) is advised. The SARS-CoV-2 RNA is generally  detectable in upper and lower respiratory sp ecimens during the acute  phase of infection. The expected result is Negative. Fact Sheet for Patients:  StrictlyIdeas.no Fact Sheet for Healthcare Providers: BankingDealers.co.za This test is not yet approved or cleared by the [place] FDA and has been authorized for detection and/or diagnosis of SARS-CoV-2 by FDA under an Emergency Use Authorization (EUA).  This EUA will remain in effect (meaning this test can be used) for the duration of the COVID-19 declaration under Section 564(b)(1) of the Act, 21 U.S.C. section 360bbb-3(b)(1), unless the authorization is terminated or revoked sooner. Performed at [facility], [place] [address] [place], [place] [address]          Radiology Studies: Ct Abdomen Pelvis Wo Contrast  Result Date: 09/01/2018 CLINICAL DATA:  58 year old female with stage IV breast cancer. Has stopped all cancer treatment. Abdominal distension, increased fatigue. EXAM: CT ABDOMEN AND PELVIS WITHOUT CONTRAST TECHNIQUE: Multidetector CT imaging of the abdomen and pelvis was performed following the standard protocol without IV contrast. COMPARISON:  PET-CT 07/25/2018.  CT Abdomen and Pelvis 05/03/2018. FINDINGS: Lower chest: Negative lung bases. No pericardial or pleural effusion. Hepatobiliary:  Hepatomegaly with diffuse metastatic infiltration of the liver. Liver size has substantially increased since February, cc liver length is 24 centimeters now, 18 centimeters previously. Surgically absent gallbladder. Pancreas: Negative. Spleen: Spleen size is at the upper limits of normal, estimated splenic volume 388 milliliters (normal splenic volume range 83 - 412 mL). No discrete splenic lesion in the absence of IV contrast. Adrenals/Urinary Tract: Stable adrenal glands. There is new right hydronephrosis and right renal atrophy since February, but no right hydroureter. Punctate right lower pole nephrolithiasis. There is mild left hydronephrosis but also no hydroureter. 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 Unremarkable urinary bladder. Stomach/Bowel: No dilated large or small bowel loops. Distal small bowel appears chronically somewhat matted together in the lower abdomen (on series 2, image 68), unchanged. Evidence of prior appendectomy. Decreased retained stool in the colon compared to February. Trace free fluid in both gutters. There may also be trace free fluid in the distal small bowel mesentery. No free air. Negative stomach. Vascular/Lymphatic: Vascular patency is not evaluated in the absence of IV contrast. Mild Calcified aortic atherosclerosis. Stable IVC filter.  Reproductive: Surgically absent uterus. Diminutive or absent ovaries. Other: No pelvic free fluid. Musculoskeletal: Mixed lytic and sclerotic bone metastases throughout the entire visible skeleton. Stable visible vertebral height and alignment since February. No pathologic pelvic or proximal femur fracture identified. There are intermittent bilateral pathologic nondisplaced rib fractures. IMPRESSION: 1. Severe metastatic disease to the liver with hepatomegaly and progression since February. Liver length has increased from 18 to 24 cm since that time. 2. Right greater than left hydronephrosis with a degree of right  renal atrophy is new since February. 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 3. Trace abdominal free fluid. No bowel obstruction or inflammation. 4. Diffuse skeletal metastatic disease. Bilateral pathologic nondisplaced rib fractures. Electronically Signed   By: Genevie Ann M.D.   On: 09/01/2018 20:37   Dg Ribs Bilateral W/chest  Result Date: 09/01/2018 CLINICAL DATA:  Bilateral rib pain after fall yesterday. EXAM: BILATERAL RIBS AND CHEST - 4+ VIEW COMPARISON:  PET-CT dated Jul 25, 2018. Chest and right rib x-rays dated June 05, 2018. FINDINGS: No definite new rib fracture. Subacute right fourth through eighth and tenth through twelfth rib fractures again noted. Subacute left sixth and ninth through eleventh rib fractures again noted. Diffuse osseous metastatic disease is unchanged. The heart size and mediastinal contours are within normal limits. Normal pulmonary vascularity. No focal consolidation, pleural effusion, or pneumothorax. Prior cholecystectomy. IVC filter. IMPRESSION: 1. Bilateral subacute pathologic rib fractures due to underlying diffuse osseous metastatic disease, similar to prior PET-CT. No definite new rib fracture. 2.  No active cardiopulmonary disease. Electronically Signed   By: Titus Dubin M.D.   On: 09/01/2018 15:08   Dg Abd 1 View  Result Date: 09/02/2018 CLINICAL DATA:  Abdominal distension, constipation, history asthma, GERD, hypertension breast cancer EXAM: ABDOMEN - 1 VIEW COMPARISON:  Portable exam 0718 hours compared to CT of 09/01/2018 FINDINGS: Surgical clips RIGHT upper quadrant from cholecystectomy. IVC filter noted. Diffuse scattered sclerosis and lucency throughout osseous structures compatible with widespread osseous metastatic disease. Normal bowel gas pattern. No bowel dilatation or bowel wall thickening. IMPRESSION: Widespread osseous metastatic disease. Nonobstructive bowel gas pattern. Electronically Signed   By: Lavonia Dana M.D.   On:  09/02/2018 08:03   Ct Head Wo Contrast  Result Date: 09/01/2018 CLINICAL DATA:  58 year old female with stage IV breast cancer. Has stopped all cancer treatment. Abdominal distension, increased fatigue.  Fall 2 days ago. EXAM: CT HEAD WITHOUT CONTRAST TECHNIQUE: Contiguous axial images were obtained from the base of the skull through the vertex without intravenous contrast. COMPARISON:  PET-CT 07/25/2018.  Head CT 02/02/2016. FINDINGS: Brain: Cerebral volume remains normal. No midline shift, ventriculomegaly, mass effect, intracranial hemorrhage or evidence of cortically based acute infarction. Gray-white matter differentiation is within normal limits throughout the brain. No cerebral edema identified. However, there is suspicion of asymmetric dural thickening along the right clinoid process and sphenoid wing (series 2, image 8 and series 5, image 26. there is associated partial effacement of the regional sulci but no significant mass effect. Vascular: Mild Calcified atherosclerosis at the skull base. Skull: Progressed since 2017 and diffuse lytic osseous lesions throughout the visible skull. Prominent osteolysis of the clivus. Sinuses/Orbits: Visualized paranasal sinuses and mastoids are well pneumatized. Other: No acute orbit or scalp soft tissue finding identified. IMPRESSION: 1.  No recent traumatic injury identified. 2. 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 Electronically Signed  By: Genevie Ann M.D.   On: 09/01/2018 22:17        Scheduled Meds: . carvedilol  6.25 mg Oral BID WC  . docusate sodium  200 mg Oral QODAY  .  morphine injection  4 mg Intravenous Once  . senna  1 tablet Oral BID   Continuous Infusions:   LOS: 0 days   Time spent= 35 mins    Ankit Arsenio Loader, MD [facility]  If 7PM-7AM, please contact night-coverage [email or web address] 09/02/2018,  9:32 AM

## 2018-09-02 NOTE — Telephone Encounter (Signed)
No los per 6/8.

## 2018-09-02 NOTE — Progress Notes (Signed)
HEMATOLOGY-ONCOLOGY PROGRESS NOTE  SUBJECTIVE: The patient continues to report that she does not want any additional treatment for her cancer.  A palliative care consult has been ordered and is pending.  She reports that her pain is well controlled at this time.  Reports ongoing fatigue and weakness.  She is anemic this morning and denies active bleeding.  She reports that she does not take blood transfusions because she is Jehovah's Witness.  She has been maintained on B12 injections as well as Aranesp injections.  The patient is due for her Xgeva injection.  She has no other complaints this morning.  Oncology History   Cancer Staging Malignant neoplasm of overlapping sites of left breast in female, estrogen receptor positive ([place]) Staging form: Breast, AJCC 7th Edition - Clinical: Stage IIIA (T2, N2, M0) - Unsigned       Malignant neoplasm of overlapping sites of left breast in female, estrogen receptor positive ([place])   05/2008 Cancer Diagnosis    Left sided breast cancer (no path report available)    11/2008 Pathologic Stage    Stage IIIA: T2 N2     Neo-Adjuvant Chemotherapy    Paclitaxel weekly x 12; doxorubicin and cyclophosphamide x 4 - both given with trifiparnib (treated at [facility] in [place])    11/2008 Definitive Surgery    Left modified radiation mastectomy: invasive lobular carcinoma, ER+, PR+, HER2/neu negative, 5/22 LN positive     - 03/2009 Radiation Therapy    Adjuvant radiation to left chest wall    2011 - 08/2014 Anti-estrogen oral therapy    Tamoxifen 20 mg used briefly; discontinued due to uterine lining concerns; changed to anastrozole 1 mg daily (began 07/2100); discontinued by patient summer of 2016    05/26/2015 Progression    Iliac bone biopsy showed metastatic carcinoma consistent with a breast primary, ER positive, PR and HER-2 negative. Her staging CT, and a PET scan was negative for visceral metastasis.    05/2015 - 01/2018 Anti-estrogen oral therapy    1.  letrozole and Ibrance started March 2017             -Ibrance dose decreased to 75 mg daily. Stopped in 11/2017 due to anemia   -Letrozole switched to Exemestane on 02/13/17 due to b/l rib pain. She exemestane herself in 01/2018.      11/16/2015 Miscellaneous    Xgeva monthly for bone metastasis     07/03/2016 Imaging    CT chest, abdomen and pelvis with contrast showed no evidence of metastasis.     02/07/2017 Imaging    CT AP W Contrast 02/07/17 IMPRESSION: 1. No CT findings for abdominal/pelvic metastatic disease. 2. No acute abdominal findings, mass lesions or adenopathy. 3. Status post cholecystectomy with mild associated common bile duct dilatation. 4. Somewhat thickened appendix appears relatively stable. No acute inflammatory process. 5. Stable mixed lytic and sclerotic osseous metastatic disease.     02/07/2017 Imaging    Bone Scan Whole Body 02/07/17 IMPRESSION: No scintigraphic evidence skeletal metastasis     08/12/2017 Imaging    Whole Boday Scan 08/12/17 IMPRESSION: Scattered degenerative type uptake as above with a questionable focus of increased tracer localization at L2 vertebral body versus artifact; no abnormality is seen at this site by CT. Consider either characterization by MR or attention on follow-up imaging.    08/12/2017 Imaging    CT AP W Contrast 08/12/17  IMPRESSION: Continued thickening of the appendix is noted without surrounding inflammation. It has maximum measured diameter is decreased  compared to prior exam. There is no evidence of acute inflammation. Stable mixed lytic and sclerotic appearance of visualized skeleton is noted consistent with history of known osseous metastases. No acute abnormality seen in the abdomen or pelvis.    08/22/2017 Surgery    APPENDECTOMY LAPAROSCOPIC ERAS PATHWAY by Dr. Malcolm Metro and REMOVAL OF LEFT TISSUE EXPANDER by Dr. Harlow Mares 08/22/17    08/22/2017 Pathology Results    Diagnosis 08/22/17  1. Appendix, Other  than Incidental - METASTATIC CARCINOMA, CONSISTENT WITH BREAST PRIMARY. - CARCINOMA IS PRESENT AT THE SURGICAL RESECTION MARGIN AS WELL AS THE SEROSAL SURFACE. - LYMPHOVASCULAR INVASION IS IDENTIFIED. - SEE COMMENT. 2. Breast, capsule, Left - DENSE FIBROUS TISSUE WITH MILD INFLAMMATION, INCLUDING A FEW SCATTERED MULTINUCLEATED GIANT CELLS. - THERE IS NO EVIDENCE OF MALIGNANCY.    08/27/2017 Imaging    CT AP W Contrast 08/27/17 IMPRESSION: No evidence of pulmonary embolus. Small pericardial effusion with uncertain clinical significance. No evidence of acute abnormalities within the chest, abdomen or pelvis. Area of fat stranding and small focus of gas within the anterior abdominal wall, immediately inferior to the umbilicus, probably at the site of laparoscopic access.    10/07/2017 Echocardiogram    10/07/2017 ECO Study Conclusions  - Procedure narrative: Transthoracic echocardiography. Image   quality was adequate. The study was technically difficult, as a   result of poor acoustic windows. - Left ventricle: The cavity size was normal. Wall thickness was   normal. Systolic function was normal. The estimated ejection   fraction was in the range of 60% to 65%. Wall motion was normal;   there were no regional wall motion abnormalities. Features are   consistent with a pseudonormal left ventricular filling pattern,   with concomitant abnormal relaxation and increased filling   pressure (grade 2 diastolic dysfunction).    01/16/2018 PET scan    01/16/2018 PET Scan IMPRESSION: 1. Most striking finding is intense marrow activity diffusely throughout the axillary and appendicular skeleton. This is favored a physiologic marrow response from chronic anemia rather than diffuse metastatic disease. 2. Several discrete foci of more intense activity noted in the spine which could indicate malignancy or trauma. Difficult to define malignancy on the background of diffuse marrow activity. 3.  Diffuse sclerosis throughout the bones similar to a recent CT scans but new from remote PET-CT scan 03/01/2016. 4. No evidence soft tissue metastasis.     04/02/2018 Imaging    CT Angio CAP 04/02/18  IMPRESSION: 1. Multiple new hepatic lesions highly concerning for developing metastatic disease to the liver. This could be confirmed with nonemergent MRI of the abdomen with and without IV gadolinium if clinically appropriate. 2. Widespread metastatic disease to the bones also appears progressive compared to prior examinations. 3. Large right-sided pelvic deep venous thrombosis extending from the right common femoral vein to the proximal right external iliac vein. IVC filter is in position. No clot identified in the IVC filter at this time. No pulmonary embolism on today's examination. 4. Aortic atherosclerosis. 5. Additional incidental findings, as above.    05/01/2018 -  Anti-estrogen oral therapy    Fulvestrant injections 556m monthly starting 05/01/18 -added oral Piqray 3062mdaily starting this week    06/30/2018 -  Antibody Plan    Monthly Xgeva restarted 06/30/18     07/25/2018 PET scan    PET  IMPRESSION: 1. Marked progression of metastatic disease with extensive tumor burden in the skeleton and liver. Multifocal intramuscular metastatic lesions in the paraspinal musculature and pelvic musculature.  2. The bony lesions include a lytic lesion of the left side of the clivus/skull base. 3. Mild to moderate right hydronephrosis and delayed renal excretion due to suspected tumor along the right renal pelvis. There is also suspected right perirenal tumor posteriorly, and a suspected small metastatic lesion to the left adrenal gland. 4. Other imaging findings of potential clinical significance: Aortic Atherosclerosis (ICD10-I70.0). Mild cardiomegaly. An IVC filter is in place.      REVIEW OF SYSTEMS:   Constitutional: Denies fevers, chills.  Reports fatigue and generalized  weakness. Eyes: Denies blurriness of vision Ears, nose, mouth, throat, and face: Denies mucositis or sore throat Respiratory: Denies cough, dyspnea or wheezes Cardiovascular: Denies palpitation, chest discomfort Gastrointestinal:  Denies nausea, heartburn.  Reports constipation. Skin: Denies abnormal skin rashes Lymphatics: Denies new lymphadenopathy or easy bruising Neurological:Denies numbness, tingling or new weaknesses Behavioral/Psych: Mood is stable, no new changes  Extremities: No lower extremity edema All other systems were reviewed with the patient and are negative.  I have reviewed the past medical history, past surgical history, social history and family history with the patient and they are unchanged from previous note.   PHYSICAL EXAMINATION:  Vitals:   09/02/18 1041 09/02/18 1338  BP: 112/68 104/60  Pulse: 70 66  Resp: 15 15  Temp: (!) 97.1 F (36.2 C) 98.4 F (36.9 C)  SpO2: 100% 100%   Filed Weights   09/01/18 1941 09/02/18 0001  Weight: 185 lb (83.9 kg) 188 lb 11.2 oz (85.6 kg)    Intake/Output from previous day: 06/08 0701 - 06/09 0700 In: 568.4 [P.O.:120; I.V.:448.4] Out: -   GENERAL:alert, no distress and comfortable SKIN: skin color, texture, turgor are normal, no rashes or significant lesions EYES: normal, Conjunctiva are pink and non-injected, sclera clear OROPHARYNX:no exudate, no erythema and lips, buccal mucosa, and tongue normal  NECK: supple, thyroid normal size, non-tender, without nodularity LYMPH:  no palpable lymphadenopathy in the cervical, axillary or inguinal LUNGS: clear to auscultation and percussion with normal breathing effort HEART: regular rate & rhythm and no murmurs and no lower extremity edema ABDOMEN:abdomen soft, non-tender and normal bowel sounds Musculoskeletal:no cyanosis of digits and no clubbing  NEURO: alert & oriented x 3 with fluent speech, no focal motor/sensory deficits  LABORATORY DATA:  I have reviewed the  data as listed CMP Latest Ref Rng & Units 09/02/2018 09/01/2018 08/04/2018  Glucose 70 - 99 mg/dL 103(H) 107(H) 111(H)  BUN 6 - 20 mg/dL 22(H) 21(H) 13  Creatinine 0.44 - 1.00 mg/dL 1.01(H) 1.17(H) 0.90  Sodium 135 - 145 mmol/L 133(L) 133(L) 134(L)  Potassium 3.5 - 5.1 mmol/L 4.1 4.2 4.2  Chloride 98 - 111 mmol/L 105 102 105  CO2 22 - 32 mmol/L 20(L) 19(L) 19(L)  Calcium 8.9 - 10.3 mg/dL 11.1(H) 12.0(H) 9.5  Total Protein 6.5 - 8.1 g/dL 5.9(L) 6.7 7.2  Total Bilirubin 0.3 - 1.2 mg/dL 4.5(H) 4.8(HH) 1.5(H)  Alkaline Phos 38 - 126 U/L 365(H) 485(H) 431(H)  AST 15 - 41 U/L 184(H) 224(HH) 88(H)  ALT 0 - 44 U/L 42 45(H) 37    Lab Results  Component Value Date   WBC 3.2 (L) 09/02/2018   HGB 6.7 (LL) 09/02/2018   HCT 23.4 (L) 09/02/2018   MCV 96.7 09/02/2018   PLT 49 (L) 09/02/2018   NEUTROABS 2.5 09/01/2018    Ct Abdomen Pelvis Wo Contrast  Result Date: 09/01/2018 CLINICAL DATA:  58 year old female with stage IV breast cancer. Has stopped all cancer treatment. Abdominal distension, increased  fatigue. EXAM: CT ABDOMEN AND PELVIS WITHOUT CONTRAST TECHNIQUE: Multidetector CT imaging of the abdomen and pelvis was performed following the standard protocol without IV contrast. COMPARISON:  PET-CT 07/25/2018.  CT Abdomen and Pelvis 05/03/2018. FINDINGS: Lower chest: Negative lung bases. No pericardial or pleural effusion. Hepatobiliary: Hepatomegaly with diffuse metastatic infiltration of the liver. Liver size has substantially increased since February, cc liver length is 24 centimeters now, 18 centimeters previously. Surgically absent gallbladder. Pancreas: Negative. Spleen: Spleen size is at the upper limits of normal, estimated splenic volume 388 milliliters (normal splenic volume range 83 - 412 mL). No discrete splenic lesion in the absence of IV contrast. Adrenals/Urinary Tract: Stable adrenal glands. There is new right hydronephrosis and right renal atrophy since February, but no right hydroureter.  Punctate right lower pole nephrolithiasis. There is mild left hydronephrosis but also no hydroureter. 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 Unremarkable urinary bladder. Stomach/Bowel: No dilated large or small bowel loops. Distal small bowel appears chronically somewhat matted together in the lower abdomen (on series 2, image 68), unchanged. Evidence of prior appendectomy. Decreased retained stool in the colon compared to February. Trace free fluid in both gutters. There may also be trace free fluid in the distal small bowel mesentery. No free air. Negative stomach. Vascular/Lymphatic: Vascular patency is not evaluated in the absence of IV contrast. Mild Calcified aortic atherosclerosis. Stable IVC filter. Reproductive: Surgically absent uterus. Diminutive or absent ovaries. Other: No pelvic free fluid. Musculoskeletal: Mixed lytic and sclerotic bone metastases throughout the entire visible skeleton. Stable visible vertebral height and alignment since February. No pathologic pelvic or proximal femur fracture identified. There are intermittent bilateral pathologic nondisplaced rib fractures. IMPRESSION: 1. Severe metastatic disease to the liver with hepatomegaly and progression since February. Liver length has increased from 18 to 24 cm since that time. 2. Right greater than left hydronephrosis with a degree of right renal atrophy is new since February. 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 3. Trace abdominal free fluid. No bowel obstruction or inflammation. 4. Diffuse skeletal metastatic disease. Bilateral pathologic nondisplaced rib fractures. Electronically Signed   By: Genevie Ann M.D.   On: 09/01/2018 20:37   Dg Ribs Bilateral W/chest  Result Date: 09/01/2018 CLINICAL DATA:  Bilateral rib pain after fall yesterday. EXAM: BILATERAL RIBS AND CHEST - 4+ VIEW COMPARISON:  PET-CT dated Jul 25, 2018. Chest and right rib x-rays dated June 05, 2018. FINDINGS: No  definite new rib fracture. Subacute right fourth through eighth and tenth through twelfth rib fractures again noted. Subacute left sixth and ninth through eleventh rib fractures again noted. Diffuse osseous metastatic disease is unchanged. The heart size and mediastinal contours are within normal limits. Normal pulmonary vascularity. No focal consolidation, pleural effusion, or pneumothorax. Prior cholecystectomy. IVC filter. IMPRESSION: 1. Bilateral subacute pathologic rib fractures due to underlying diffuse osseous metastatic disease, similar to prior PET-CT. No definite new rib fracture. 2.  No active cardiopulmonary disease. Electronically Signed   By: Titus Dubin M.D.   On: 09/01/2018 15:08   Dg Abd 1 View  Result Date: 09/02/2018 CLINICAL DATA:  Abdominal distension, constipation, history asthma, GERD, hypertension breast cancer EXAM: ABDOMEN - 1 VIEW COMPARISON:  Portable exam 0718 hours compared to CT of 09/01/2018 FINDINGS: Surgical clips RIGHT upper quadrant from cholecystectomy. IVC filter noted. Diffuse scattered sclerosis and lucency throughout osseous structures compatible with widespread osseous metastatic disease. Normal bowel gas pattern. No bowel dilatation or bowel wall thickening. IMPRESSION: Widespread  osseous metastatic disease. Nonobstructive bowel gas pattern. Electronically Signed   By: Lavonia Dana M.D.   On: 09/02/2018 08:03   Ct Head Wo Contrast  Result Date: 09/01/2018 CLINICAL DATA:  58 year old female with stage IV breast cancer. Has stopped all cancer treatment. Abdominal distension, increased fatigue.  Fall 2 days ago. EXAM: CT HEAD WITHOUT CONTRAST TECHNIQUE: Contiguous axial images were obtained from the base of the skull through the vertex without intravenous contrast. COMPARISON:  PET-CT 07/25/2018.  Head CT 02/02/2016. FINDINGS: Brain: Cerebral volume remains normal. No midline shift, ventriculomegaly, mass effect, intracranial hemorrhage or evidence of cortically based  acute infarction. Gray-white matter differentiation is within normal limits throughout the brain. No cerebral edema identified. However, there is suspicion of asymmetric dural thickening along the right clinoid process and sphenoid wing (series 2, image 8 and series 5, image 26. there is associated partial effacement of the regional sulci but no significant mass effect. Vascular: Mild Calcified atherosclerosis at the skull base. Skull: Progressed since 2017 and diffuse lytic osseous lesions throughout the visible skull. Prominent osteolysis of the clivus. Sinuses/Orbits: Visualized paranasal sinuses and mastoids are well pneumatized. Other: No acute orbit or scalp soft tissue finding identified. IMPRESSION: 1.  No recent traumatic injury identified. 2. 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 Electronically Signed   By: Genevie Ann M.D.   On: 09/01/2018 22:17    ASSESSMENT AND PLAN: 1.  Metastatic breast cancer, ER/PR +, HER-2 negative 2.  Bone metastases with hypercalcemia malignancy 3.  Severe anemia (refuses transfusions as she is a Jehovah's Witness) 4.  Thrombocytopenia 5.  Right lower extremity DVT, and PE 6.  Failure to thrive 7.  Transaminitis and hyperbilirubinemia  -The patient has confirmed she has not wanting additional treatment for her breast cancer. -Palliative care consult has been ordered and is pending. -We will administer Zometa 4 mg IV for hypercalcemia malignancy and bone metastases. -We will administer her outpatient dose of Aranesp 500 mcg as well as her vitamin B12 injection today. -Continue to monitor platelets.  She has no active bleeding.  Likely related to her underlying bone metastases. -Agree with holding off on Lovenox for now until palliative care discussion has taken place.  The patient self discontinued this medication. -Transaminitis and  hyperbilirubinemia is likely due to her liver mets.  Continue to monitor.   LOS: 0 days   Mikey Bussing, DNP, AGPCNP-BC, AOCNP 09/02/18

## 2018-09-02 NOTE — Progress Notes (Signed)
Pt complaining of constipation suppository given with no result. Pt asked for enema MD paged and new orders placed.

## 2018-09-03 ENCOUNTER — Ambulatory Visit: Payer: Medicare Other | Admitting: Obstetrics & Gynecology

## 2018-09-03 DIAGNOSIS — Z6829 Body mass index (BMI) 29.0-29.9, adult: Secondary | ICD-10-CM

## 2018-09-03 DIAGNOSIS — K7290 Hepatic failure, unspecified without coma: Secondary | ICD-10-CM

## 2018-09-03 DIAGNOSIS — C50919 Malignant neoplasm of unspecified site of unspecified female breast: Secondary | ICD-10-CM

## 2018-09-03 DIAGNOSIS — Z5329 Procedure and treatment not carried out because of patient's decision for other reasons: Secondary | ICD-10-CM

## 2018-09-03 DIAGNOSIS — R627 Adult failure to thrive: Secondary | ICD-10-CM

## 2018-09-03 DIAGNOSIS — D6959 Other secondary thrombocytopenia: Secondary | ICD-10-CM

## 2018-09-03 DIAGNOSIS — Z515 Encounter for palliative care: Secondary | ICD-10-CM

## 2018-09-03 DIAGNOSIS — Z531 Procedure and treatment not carried out because of patient's decision for reasons of belief and group pressure: Secondary | ICD-10-CM

## 2018-09-03 DIAGNOSIS — Z170 Estrogen receptor positive status [ER+]: Secondary | ICD-10-CM

## 2018-09-03 DIAGNOSIS — D649 Anemia, unspecified: Secondary | ICD-10-CM

## 2018-09-03 DIAGNOSIS — Z86718 Personal history of other venous thrombosis and embolism: Secondary | ICD-10-CM

## 2018-09-03 DIAGNOSIS — C7951 Secondary malignant neoplasm of bone: Secondary | ICD-10-CM

## 2018-09-03 NOTE — Progress Notes (Signed)
Michaela Little   DOB:[birth year]   ZO#:[id number]   [id number]#:[id number]  Oncology follow up   Subjective: Pt is well-know to me, admitted from my office for failure of thrive at home and worsening LFTs on 09/01/2018. She is overall feeling slightly better, but still very fatigued with poor appetite.    Objective:  Vitals:   09/02/18 2224 09/03/18 0619  BP: (!) 108/52 (!) 119/57  Pulse: 66 72  Resp: 18 18  Temp: 97.8 F (36.6 C) 97.6 F (36.4 C)  SpO2: 100% 99%    Body mass index is 29.55 kg/m.  Intake/Output Summary (Last 24 hours) at 09/03/2018 0827 Last data filed at 09/03/2018 0600 Gross per 24 hour  Intake 641.51 ml  Output -  Net 641.51 ml     Sclerae unicteric  Oropharynx clear  No peripheral adenopathy  Lungs clear -- no rales or rhonchi  Heart regular rate and rhythm  Abdomen soft, (+) RUQ tenderness   Neuro nonfocal   CBG (last 3)  No results for input(s): GLUCAP in the last 72 hours.   Labs:  Urine Studies No results for input(s): UHGB, CRYS in the last 72 hours.  Invalid input(s): UACOL, UAPR, USPG, UPH, UTP, UGL, UKET, UBIL, UNIT, UROB, ULEU, UEPI, UWBC, URBC, UBAC, CAST, UCOM, BILUA  Basic Metabolic Panel: Recent Labs  Lab 09/01/18 1447 09/02/18 0414  NA 133* 133*  K 4.2 4.1  CL 102 105  CO2 19* 20*  GLUCOSE 107* 103*  BUN 21* 22*  CREATININE 1.17* 1.01*  CALCIUM 12.0* 11.1*  MG  --  2.0  PHOS  --  2.5   GFR Estimated Creatinine Clearance: 66.6 mL/min (A) (by C-G formula based on SCr of 1.01 mg/dL (H)). Liver Function Tests: Recent Labs  Lab 09/01/18 1447 09/02/18 0414  AST 224* 184*  ALT 45* 42  ALKPHOS 485* 365*  BILITOT 4.8* 4.5*  PROT 6.7 5.9*  ALBUMIN 2.7* 2.6*   No results for input(s): LIPASE, AMYLASE in the last 168 hours. No results for input(s): AMMONIA in the last 168 hours. Coagulation profile Recent Labs  Lab 09/02/18 0414  INR 1.3*    CBC: Recent Labs  Lab 09/01/18 1447 09/02/18 0414  WBC 4.6 3.2*  NEUTROABS  2.5  --   HGB 8.0* 6.7*  HCT 27.8* 23.4*  MCV 94.9 96.7  PLT 63* 49*   Cardiac Enzymes: No results for input(s): CKTOTAL, CKMB, CKMBINDEX, TROPONINI in the last 168 hours. BNP: Invalid input(s): POCBNP CBG: No results for input(s): GLUCAP in the last 168 hours. D-Dimer No results for input(s): DDIMER in the last 72 hours. Hgb A1c Recent Labs    09/01/18 1447  HGBA1C 4.7*   Lipid Profile No results for input(s): CHOL, HDL, LDLCALC, TRIG, CHOLHDL, LDLDIRECT in the last 72 hours. Thyroid function studies Recent Labs    09/02/18 0414  TSH 3.284   Anemia work up No results for input(s): VITAMINB12, FOLATE, FERRITIN, TIBC, IRON, RETICCTPCT in the last 72 hours. Microbiology Recent Results (from the past 240 hour(s))  SARS Coronavirus 2 (CEPHEID - Performed in [place] hospital lab), Hosp Order     Status: None   Collection Time: 09/01/18  8:49 PM  Result Value Ref Range Status   SARS Coronavirus 2 NEGATIVE NEGATIVE Final    Comment: (NOTE) If result is NEGATIVE SARS-CoV-2 target nucleic acids are NOT DETECTED. The SARS-CoV-2 RNA is generally detectable in upper and lower  respiratory specimens during the acute phase of infection. The lowest  concentration of SARS-CoV-2 viral copies this assay can detect is 250  copies / mL. A negative result does not preclude SARS-CoV-2 infection  and should not be used as the sole basis for treatment or other  patient management decisions.  A negative result may occur with  improper specimen collection / handling, submission of specimen other  than nasopharyngeal swab, presence of viral mutation(s) within the  areas targeted by this assay, and inadequate number of viral copies  (<250 copies / mL). A negative result must be combined with clinical  observations, patient history, and epidemiological information. If result is POSITIVE SARS-CoV-2 target nucleic acids are DETECTED. The SARS-CoV-2 RNA is generally detectable in upper and  lower  respiratory specimens dur ing the acute phase of infection.  Positive  results are indicative of active infection with SARS-CoV-2.  Clinical  correlation with patient history and other diagnostic information is  necessary to determine patient infection status.  Positive results do  not rule out bacterial infection or co-infection with other viruses. If result is PRESUMPTIVE POSTIVE SARS-CoV-2 nucleic acids MAY BE PRESENT.   A presumptive positive result was obtained on the submitted specimen  and confirmed on repeat testing.  While 2019 novel coronavirus  (SARS-CoV-2) nucleic acids may be present in the submitted sample  additional confirmatory testing may be necessary for epidemiological  and / or clinical management purposes  to differentiate between  SARS-CoV-2 and other Sarbecovirus currently known to infect humans.  If clinically indicated additional testing with an alternate test  methodology [phone]) is advised. The SARS-CoV-2 RNA is generally  detectable in upper and lower respiratory sp ecimens during the acute  phase of infection. The expected result is Negative. Fact Sheet for Patients:  StrictlyIdeas.no Fact Sheet for Healthcare Providers: BankingDealers.co.za This test is not yet approved or cleared by the [place] FDA and has been authorized for detection and/or diagnosis of SARS-CoV-2 by FDA under an Emergency Use Authorization (EUA).  This EUA will remain in effect (meaning this test can be used) for the duration of the COVID-19 declaration under Section 564(b)(1) of the Act, 21 U.S.C. section 360bbb-3(b)(1), unless the authorization is terminated or revoked sooner. Performed at [facility], [place] [address], [place], [place] [address]       Studies:  Ct Abdomen Pelvis Wo Contrast  Result Date: 09/01/2018 CLINICAL DATA:  58 year old female with stage IV breast cancer. Has stopped all  cancer treatment. Abdominal distension, increased fatigue. EXAM: CT ABDOMEN AND PELVIS WITHOUT CONTRAST TECHNIQUE: Multidetector CT imaging of the abdomen and pelvis was performed following the standard protocol without IV contrast. COMPARISON:  PET-CT 07/25/2018.  CT Abdomen and Pelvis 05/03/2018. FINDINGS: Lower chest: Negative lung bases. No pericardial or pleural effusion. Hepatobiliary: Hepatomegaly with diffuse metastatic infiltration of the liver. Liver size has substantially increased since February, cc liver length is 24 centimeters now, 18 centimeters previously. Surgically absent gallbladder. Pancreas: Negative. Spleen: Spleen size is at the upper limits of normal, estimated splenic volume 388 milliliters (normal splenic volume range 83 - 412 mL). No discrete splenic lesion in the absence of IV contrast. Adrenals/Urinary Tract: Stable adrenal glands. There is new right hydronephrosis and right renal atrophy since February, but no right hydroureter. Punctate right lower pole nephrolithiasis. There is mild left hydronephrosis but also no hydroureter. 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 Unremarkable urinary bladder. Stomach/Bowel: No dilated large or small bowel loops. Distal small bowel appears chronically somewhat matted together in the lower abdomen (  on series 2, image 68), unchanged. Evidence of prior appendectomy. Decreased retained stool in the colon compared to February. Trace free fluid in both gutters. There may also be trace free fluid in the distal small bowel mesentery. No free air. Negative stomach. Vascular/Lymphatic: Vascular patency is not evaluated in the absence of IV contrast. Mild Calcified aortic atherosclerosis. Stable IVC filter. Reproductive: Surgically absent uterus. Diminutive or absent ovaries. Other: No pelvic free fluid. Musculoskeletal: Mixed lytic and sclerotic bone metastases throughout the entire visible skeleton. Stable visible vertebral height  and alignment since February. No pathologic pelvic or proximal femur fracture identified. There are intermittent bilateral pathologic nondisplaced rib fractures. IMPRESSION: 1. Severe metastatic disease to the liver with hepatomegaly and progression since February. Liver length has increased from 18 to 24 cm since that time. 2. Right greater than left hydronephrosis with a degree of right renal atrophy is new since February. 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 3. Trace abdominal free fluid. No bowel obstruction or inflammation. 4. Diffuse skeletal metastatic disease. Bilateral pathologic nondisplaced rib fractures. Electronically Signed   By: Genevie Ann M.D.   On: 09/01/2018 20:37   Dg Ribs Bilateral W/chest  Result Date: 09/01/2018 CLINICAL DATA:  Bilateral rib pain after fall yesterday. EXAM: BILATERAL RIBS AND CHEST - 4+ VIEW COMPARISON:  PET-CT dated Jul 25, 2018. Chest and right rib x-rays dated June 05, 2018. FINDINGS: No definite new rib fracture. Subacute right fourth through eighth and tenth through twelfth rib fractures again noted. Subacute left sixth and ninth through eleventh rib fractures again noted. Diffuse osseous metastatic disease is unchanged. The heart size and mediastinal contours are within normal limits. Normal pulmonary vascularity. No focal consolidation, pleural effusion, or pneumothorax. Prior cholecystectomy. IVC filter. IMPRESSION: 1. Bilateral subacute pathologic rib fractures due to underlying diffuse osseous metastatic disease, similar to prior PET-CT. No definite new rib fracture. 2.  No active cardiopulmonary disease. Electronically Signed   By: Titus Dubin M.D.   On: 09/01/2018 15:08   Dg Abd 1 View  Result Date: 09/02/2018 CLINICAL DATA:  Abdominal distension, constipation, history asthma, GERD, hypertension breast cancer EXAM: ABDOMEN - 1 VIEW COMPARISON:  Portable exam 0718 hours compared to CT of 09/01/2018 FINDINGS: Surgical clips RIGHT  upper quadrant from cholecystectomy. IVC filter noted. Diffuse scattered sclerosis and lucency throughout osseous structures compatible with widespread osseous metastatic disease. Normal bowel gas pattern. No bowel dilatation or bowel wall thickening. IMPRESSION: Widespread osseous metastatic disease. Nonobstructive bowel gas pattern. Electronically Signed   By: Lavonia Dana M.D.   On: 09/02/2018 08:03   Ct Head Wo Contrast  Result Date: 09/01/2018 CLINICAL DATA:  58 year old female with stage IV breast cancer. Has stopped all cancer treatment. Abdominal distension, increased fatigue.  Fall 2 days ago. EXAM: CT HEAD WITHOUT CONTRAST TECHNIQUE: Contiguous axial images were obtained from the base of the skull through the vertex without intravenous contrast. COMPARISON:  PET-CT 07/25/2018.  Head CT 02/02/2016. FINDINGS: Brain: Cerebral volume remains normal. No midline shift, ventriculomegaly, mass effect, intracranial hemorrhage or evidence of cortically based acute infarction. Gray-white matter differentiation is within normal limits throughout the brain. No cerebral edema identified. However, there is suspicion of asymmetric dural thickening along the right clinoid process and sphenoid wing (series 2, image 8 and series 5, image 26. there is associated partial effacement of the regional sulci but no significant mass effect. Vascular: Mild Calcified atherosclerosis at the skull base. Skull: Progressed since 2017 and diffuse lytic osseous lesions throughout  the visible skull. Prominent osteolysis of the clivus. Sinuses/Orbits: Visualized paranasal sinuses and mastoids are well pneumatized. Other: No acute orbit or scalp soft tissue finding identified. IMPRESSION: 1.  No recent traumatic injury identified. 2. 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 Electronically Signed   By: Genevie Ann M.D.    On: 09/01/2018 22:17    Assessment: 58 y.o.   1.  Metastatic breast cancer, ER/PR +, HER-2 negative, pt has declined treatment lately after disease progression  2.  Bone metastases with hypercalcemia malignancy 3.  Severe anemia (refuses transfusions as she is a [employer]), on Aranesp injection every 2 weeks  4.  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  history of right lower extremity DVT, off a/c by pt  6.  Failure to thrive 7.  Transaminitis and hyperbilirubinemia  Plan:  -Patient has deteriorated more lately, she is clearly terminal.  We previously offered cancer treatment, she declined due to concerns of side effects -I reviewed her recent CT scan, which indicated further disease progression.  She is terminal, given the liver failure, I anticipate her life expectancy could be a few weeks -She is open to hospice now, but wants to go home, if she can get more hours of personal care at home. She is reluctant to go to residential hospice. -Pt wants to wait until her daughter Brunetta Genera to come in to hospital and discussed with her -I appreciate the care of palliative care and Hospitalist service  -I will f/u    [name], MD 09/03/2018  8:27 AM

## 2018-09-03 NOTE — Progress Notes (Signed)
OT Cancellation Note  Patient Details Name: Michaela Little MRN: [id number] DOB: [birth year]   Cancelled Treatment:    Reason Eval/Treat Not Completed: Other (comment). Noted poor px in palliative care notes. Pt plans home hospice. She has been getting up to [facility] with +1 nursing staff.  When I've checked on her, she has been either in pain or sleeping soundly. Will sign off.  Terra Aveni 09/03/2018, 3:09 PM  Lesle Chris, OTR/L Acute Rehabilitation Services [phone] [facility] pager [phone] office 09/03/2018

## 2018-09-03 NOTE — Progress Notes (Signed)
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  Upon admission she was also found to be anemic but no transfusion due to being Jehovah's Witness.  She has refused to take any further chemotherapy for her cancer.  She has been seen by oncology.  She was also seen by palliative care and she likely plans to go home with hospice however she is still waiting for her daughter to come in.  Consultants:   Oncology  Palliative care  Procedures:   None  Antimicrobials:   None   Subjective: Patient seen and examined.  She complains of abdominal pain, no better or worse than yesterday.  She looks comfortable.  No other complaint.  Objective: Vitals:   09/02/18 1338 09/02/18 1657 09/02/18 2224 09/03/18 0619  BP: 104/60 107/62 (!) 108/52 (!) 119/57  Pulse: 66 61 66 72  Resp: 15 16 18 18   Temp: 98.4 F (36.9 C) 98.4 F (36.9 C) 97.8 F (36.6 C) 97.6 F (36.4 C)  TempSrc: Oral Oral Oral Oral  SpO2: 100% 100% 100% 99%  Weight:      Height:        Intake/Output Summary (Last 24 hours) at 09/03/2018 0831 Last data filed at 09/03/2018 0600 Gross per 24 hour  Intake 641.51 ml  Output --  Net 641.51 ml   Filed Weights   09/01/18 1941 09/02/18 0001  Weight: 83.9 kg 85.6 kg    Examination:  General exam: Appears calm and comfortable  Respiratory system: Clear to auscultation. Respiratory effort normal. Cardiovascular system: S1 & S2 heard, RRR. No JVD, murmurs, rubs, gallops or clicks. No pedal edema. Gastrointestinal system: Abdomen is  mildly distended, soft and slightly generalized tender. No organomegaly or masses felt. Normal bowel sounds heard. Central nervous system: Alert and oriented. No focal neurological deficits. Extremities: Symmetric 5 x 5 power. Skin: No rashes, lesions or ulcers Psychiatry: Judgement and insight appear normal. Mood & affect appropriate.    Data Reviewed: I have personally reviewed following labs and imaging studies  CBC: Recent Labs  Lab 09/01/18 1447 09/02/18 0414  WBC 4.6 3.2*  NEUTROABS 2.5  --   HGB 8.0* 6.7*  HCT 27.8* 23.4*  MCV 94.9 96.7  PLT 63* 49*   Basic Metabolic Panel: Recent Labs  Lab 09/01/18 1447 09/02/18 0414  NA 133* 133*  K 4.2 4.1  CL 102 105  CO2 19* 20*  GLUCOSE 107* 103*  BUN 21* 22*  CREATININE 1.17* 1.01*  CALCIUM 12.0* 11.1*  MG  --  2.0  PHOS  --  2.5   GFR: Estimated Creatinine Clearance: 66.6 mL/min (A) (by C-G formula based on SCr of 1.01 mg/dL (H)). Liver Function Tests: Recent Labs  Lab 09/01/18 1447 09/02/18 0414  AST 224* 184*  ALT 45* 42  ALKPHOS 485* 365*  BILITOT 4.8* 4.5*  PROT 6.7 5.9*  ALBUMIN 2.7* 2.6*   No results for input(s): LIPASE, AMYLASE in the last 168 hours. No results for input(s): AMMONIA in the last 168 hours. Coagulation Profile: Recent Labs  Lab 09/02/18 0414  INR 1.3*   Cardiac  Enzymes: No results for input(s): CKTOTAL, CKMB, CKMBINDEX, TROPONINI in the last 168 hours. BNP (last 3 results) No results for input(s): PROBNP in the last 8760 hours. HbA1C: Recent Labs    09/01/18 1447  HGBA1C 4.7*   CBG: No results for input(s): GLUCAP in the last 168 hours. Lipid Profile: No results for input(s): CHOL, HDL, LDLCALC, TRIG, CHOLHDL, LDLDIRECT in the last 72 hours. Thyroid Function Tests: Recent Labs    09/02/18 0414  TSH 3.284   Anemia Panel: No results for input(s): VITAMINB12, FOLATE, FERRITIN, TIBC, IRON, RETICCTPCT in the last 72 hours. Sepsis Labs: No results for input(s):  PROCALCITON, LATICACIDVEN in the last 168 hours.  Recent Results (from the past 240 hour(s))  SARS Coronavirus 2 (CEPHEID - Performed in [place] hospital lab), Hosp Order     Status: None   Collection Time: 09/01/18  8:49 PM  Result Value Ref Range Status   SARS Coronavirus 2 NEGATIVE NEGATIVE Final    Comment: (NOTE) If result is NEGATIVE SARS-CoV-2 target nucleic acids are NOT DETECTED. The SARS-CoV-2 RNA is generally detectable in upper and lower  respiratory specimens during the acute phase of infection. The lowest  concentration of SARS-CoV-2 viral copies this assay can detect is 250  copies / mL. A negative result does not preclude SARS-CoV-2 infection  and should not be used as the sole basis for treatment or other  patient management decisions.  A negative result may occur with  improper specimen collection / handling, submission of specimen other  than nasopharyngeal swab, presence of viral mutation(s) within the  areas targeted by this assay, and inadequate number of viral copies  (<250 copies / mL). A negative result must be combined with clinical  observations, patient history, and epidemiological information. If result is POSITIVE SARS-CoV-2 target nucleic acids are DETECTED. The SARS-CoV-2 RNA is generally detectable in upper and lower  respiratory specimens dur ing the acute phase of infection.  Positive  results are indicative of active infection with SARS-CoV-2.  Clinical  correlation with patient history and other diagnostic information is  necessary to determine patient infection status.  Positive results do  not rule out bacterial infection or co-infection with other viruses. If result is PRESUMPTIVE POSTIVE SARS-CoV-2 nucleic acids MAY BE PRESENT.   A presumptive positive result was obtained on the submitted specimen  and confirmed on repeat testing.  While 2019 novel coronavirus  (SARS-CoV-2) nucleic acids may be present in the submitted sample  additional  confirmatory testing may be necessary for epidemiological  and / or clinical management purposes  to differentiate between  SARS-CoV-2 and other Sarbecovirus currently known to infect humans.  If clinically indicated additional testing with an alternate test  methodology [phone]) is advised. The SARS-CoV-2 RNA is generally  detectable in upper and lower respiratory sp ecimens during the acute  phase of infection. The expected result is Negative. Fact Sheet for Patients:  StrictlyIdeas.no Fact Sheet for Healthcare Providers: BankingDealers.co.za This test is not yet approved or cleared by the [place] FDA and has been authorized for detection and/or diagnosis of SARS-CoV-2 by FDA under an Emergency Use Authorization (EUA).  This EUA will remain in effect (meaning this test can be used) for the duration of the COVID-19 declaration under Section 564(b)(1) of the Act, 21 U.S.C. section 360bbb-3(b)(1), unless the authorization is terminated or revoked sooner. Performed at [facility], [place] [address], [place], [place] [address]       Radiology Studies: Ct Abdomen Pelvis  Wo Contrast  Result Date: 09/01/2018 CLINICAL DATA:  58 year old female with stage IV breast cancer. Has stopped all cancer treatment. Abdominal distension, increased fatigue. EXAM: CT ABDOMEN AND PELVIS WITHOUT CONTRAST TECHNIQUE: Multidetector CT imaging of the abdomen and pelvis was performed following the standard protocol without IV contrast. COMPARISON:  PET-CT 07/25/2018.  CT Abdomen and Pelvis 05/03/2018. FINDINGS: Lower chest: Negative lung bases. No pericardial or pleural effusion. Hepatobiliary: Hepatomegaly with diffuse metastatic infiltration of the liver. Liver size has substantially increased since February, cc liver length is 24 centimeters now, 18 centimeters previously. Surgically absent gallbladder. Pancreas: Negative. Spleen: Spleen size  is at the upper limits of normal, estimated splenic volume 388 milliliters (normal splenic volume range 83 - 412 mL). No discrete splenic lesion in the absence of IV contrast. Adrenals/Urinary Tract: Stable adrenal glands. There is new right hydronephrosis and right renal atrophy since February, but no right hydroureter. Punctate right lower pole nephrolithiasis. There is mild left hydronephrosis but also no hydroureter. 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 Unremarkable urinary bladder. Stomach/Bowel: No dilated large or small bowel loops. Distal small bowel appears chronically somewhat matted together in the lower abdomen (on series 2, image 68), unchanged. Evidence of prior appendectomy. Decreased retained stool in the colon compared to February. Trace free fluid in both gutters. There may also be trace free fluid in the distal small bowel mesentery. No free air. Negative stomach. Vascular/Lymphatic: Vascular patency is not evaluated in the absence of IV contrast. Mild Calcified aortic atherosclerosis. Stable IVC filter. Reproductive: Surgically absent uterus. Diminutive or absent ovaries. Other: No pelvic free fluid. Musculoskeletal: Mixed lytic and sclerotic bone metastases throughout the entire visible skeleton. Stable visible vertebral height and alignment since February. No pathologic pelvic or proximal femur fracture identified. There are intermittent bilateral pathologic nondisplaced rib fractures. IMPRESSION: 1. Severe metastatic disease to the liver with hepatomegaly and progression since February. Liver length has increased from 18 to 24 cm since that time. 2. Right greater than left hydronephrosis with a degree of right renal atrophy is new since February. 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 3. Trace abdominal free fluid. No bowel obstruction or inflammation. 4. Diffuse skeletal metastatic disease. Bilateral pathologic nondisplaced rib  fractures. Electronically Signed   By: Genevie Ann M.D.   On: 09/01/2018 20:37   Dg Ribs Bilateral W/chest  Result Date: 09/01/2018 CLINICAL DATA:  Bilateral rib pain after fall yesterday. EXAM: BILATERAL RIBS AND CHEST - 4+ VIEW COMPARISON:  PET-CT dated Jul 25, 2018. Chest and right rib x-rays dated June 05, 2018. FINDINGS: No definite new rib fracture. Subacute right fourth through eighth and tenth through twelfth rib fractures again noted. Subacute left sixth and ninth through eleventh rib fractures again noted. Diffuse osseous metastatic disease is unchanged. The heart size and mediastinal contours are within normal limits. Normal pulmonary vascularity. No focal consolidation, pleural effusion, or pneumothorax. Prior cholecystectomy. IVC filter. IMPRESSION: 1. Bilateral subacute pathologic rib fractures due to underlying diffuse osseous metastatic disease, similar to prior PET-CT. No definite new rib fracture. 2.  No active cardiopulmonary disease. Electronically Signed   By: Titus Dubin M.D.   On: 09/01/2018 15:08   Dg Abd 1 View  Result Date: 09/02/2018 CLINICAL DATA:  Abdominal distension, constipation, history asthma, GERD, hypertension breast cancer EXAM: ABDOMEN - 1 VIEW COMPARISON:  Portable exam 0718 hours compared to CT of 09/01/2018 FINDINGS: Surgical clips RIGHT upper quadrant from cholecystectomy. IVC filter noted. Diffuse scattered sclerosis  and lucency throughout osseous structures compatible with widespread osseous metastatic disease. Normal bowel gas pattern. No bowel dilatation or bowel wall thickening. IMPRESSION: Widespread osseous metastatic disease. Nonobstructive bowel gas pattern. Electronically Signed   By: Lavonia Dana M.D.   On: 09/02/2018 08:03   Ct Head Wo Contrast  Result Date: 09/01/2018 CLINICAL DATA:  58 year old female with stage IV breast cancer. Has stopped all cancer treatment. Abdominal distension, increased fatigue.  Fall 2 days ago. EXAM: CT HEAD WITHOUT CONTRAST  TECHNIQUE: Contiguous axial images were obtained from the base of the skull through the vertex without intravenous contrast. COMPARISON:  PET-CT 07/25/2018.  Head CT 02/02/2016. FINDINGS: Brain: Cerebral volume remains normal. No midline shift, ventriculomegaly, mass effect, intracranial hemorrhage or evidence of cortically based acute infarction. Gray-white matter differentiation is within normal limits throughout the brain. No cerebral edema identified. However, there is suspicion of asymmetric dural thickening along the right clinoid process and sphenoid wing (series 2, image 8 and series 5, image 26. there is associated partial effacement of the regional sulci but no significant mass effect. Vascular: Mild Calcified atherosclerosis at the skull base. Skull: Progressed since 2017 and diffuse lytic osseous lesions throughout the visible skull. Prominent osteolysis of the clivus. Sinuses/Orbits: Visualized paranasal sinuses and mastoids are well pneumatized. Other: No acute orbit or scalp soft tissue finding identified. IMPRESSION: 1.  No recent traumatic injury identified. 2. 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 Electronically Signed   By: Genevie Ann M.D.   On: 09/01/2018 22:17    Scheduled Meds:  carvedilol  6.25 mg Oral BID WC   darbepoetin (ARANESP) injection - NON-DIALYSIS  250 mcg Subcutaneous Q Tue-1800   docusate sodium  200 mg Oral QODAY   feeding supplement (ENSURE ENLIVE)  237 mL Oral TID BM    morphine injection  4 mg Intravenous Once   senna  1 tablet Oral BID   Continuous Infusions:   LOS: 1 day   Assessment & Plan:   Principal Problem:   Failure to thrive in adult Active Problems:   Asthma   Diastolic dysfunction   Malignant neoplasm of overlapping sites of left breast in female, estrogen receptor positive (HCC)   GERD (gastroesophageal reflux  disease)   Constipation   Essential hypertension   Bone metastases (HCC)   Anemia in neoplastic disease   Pulmonary embolism (HCC)   Metastatic breast cancer (HCC)   Elevated LFTs  Advanced metastatic breast cancer/failure to thrive in adult: Patient has decided to stop all sort of chemotherapy.  She has been seen by oncology and they are very well aware.  Continue supportive care.  Seen by palliative care and she plans to go with hospice however she tells me that she is waiting for her daughter to come out today so she can talk to them in person and make a final decision.  Elevated LFTs: Secondary to metastatic cancer to liver.  Stable.  Monitor.  Anemia/thrombocytopenia: Hemoglobin 6.7.  No signs of bleeding.  Denies blood transfusion due to being Jehovah's Witness.  Platelets are stable.  Avoid heparin products.  Lovenox on hold.  She was on Lovenox for her DVT and PEs in the past.  History of DVTs/PE/status post IVC filter in the past: She self discontinued her Lovenox.  This is on hold due to thrombocytopenia now.  GERD: Continue home medications.  Asthma: Table.  Continue home medications.  History of paroxysmal atrial fibrillation: In sinus rhythm right now.  Continue Coreg and monitor.  DVT prophylaxis: SCD Code Status: DNR Family Communication: None at the bedside. Disposition Plan: Likely hospice once patient make a final decision.   Time spent: 30 minutes   Darliss Cheney, MD [facility] Pager [phone]  If 7PM-7AM, please contact night-coverage [email or web address] Password [facility] 09/03/2018, 8:31 AM

## 2018-09-03 NOTE — Progress Notes (Signed)
Daily Progress Note   Patient Name: Michaela Little       Date: 09/03/2018 DOB: 11-Apr-1960  Age: 58 y.o. MRN#: [id number] Attending Physician: Darliss Cheney, MD Primary Care Physician: Ladell Pier, MD Admit Date: 09/01/2018  Reason for Consultation/Follow-up: Establishing goals of care  Subjective:  appears more weak and deconditioned. Is able to wake up and answer few questions, some ongoing abdominal pain. She did have large bowel movements yesterday.   Length of Stay: 1  Current Medications: Scheduled Meds:  . carvedilol  6.25 mg Oral BID WC  . darbepoetin (ARANESP) injection - NON-DIALYSIS  250 mcg Subcutaneous Q Tue-1800  . docusate sodium  200 mg Oral QODAY  . feeding supplement (ENSURE ENLIVE)  237 mL Oral TID BM  .  morphine injection  4 mg Intravenous Once  . senna  1 tablet Oral BID    Continuous Infusions:   PRN Meds: acetaminophen **OR** acetaminophen, bisacodyl, ipratropium-albuterol, lactulose, morphine injection, ondansetron **OR** ondansetron (ZOFRAN) IV, polyethylene glycol, traMADol  Physical Exam         Awakens easily Appears weak and chronically ill Has some edema generalized Regular S 1 S2  Abdomen somewhat distended  Vital Signs: BP 104/60 (BP Location: Right Leg)   Pulse 66   Temp 97.7 F (36.5 C) (Oral)   Resp 18   Ht 5\' 7"  (1.702 m)   Wt 85.6 kg   LMP 02/25/2009   SpO2 100%   BMI 29.55 kg/m  SpO2: SpO2: 100 % O2 Device: O2 Device: Room Air O2 Flow Rate:    Intake/output summary:   Intake/Output Summary (Last 24 hours) at 09/03/2018 1259 Last data filed at 09/03/2018 2992 Gross per 24 hour  Intake 345 ml  Output 0 ml  Net 345 ml   LBM: Last BM Date: 09/02/18 Baseline Weight: Weight: 83.9 kg Most recent weight: Weight: 85.6 kg        Palliative Assessment/Data:    Flowsheet Rows     Most Recent Value  Intake Tab  Referral Department  Hospitalist  Unit at Time of Referral  Med/Surg Unit  Palliative Care Primary Diagnosis  Cancer  Palliative Care Type  New Palliative care  Reason for referral  Clarify Goals of Care  Date first seen by Palliative Care  09/02/18  Clinical Assessment  Palliative  Performance Scale Score  30%  Pain Max last 24 hours  4  Pain Min Last 24 hours  3  Dyspnea Max Last 24 Hours  4  Dyspnea Min Last 24 hours  3  Nausea Max Last 24 Hours  3  Nausea Min Last 24 Hours  2  Anxiety Max Last 24 Hours  4  Anxiety Min Last 24 Hours  3  Psychosocial & Spiritual Assessment  Palliative Care Outcomes  Patient/Family meeting held?  Yes  Who was at the meeting?  Patient, also discussed with daughter over the phone      Patient Active Problem List   Diagnosis Date Noted  . Palliative care by specialist   . Failure to thrive in adult 09/02/2018  . Elevated LFTs 09/01/2018  . Vulvar lesion 08/15/2018  . Dysplasia of cervix 07/15/2018  . Non-toxic nodular goiter 07/15/2018  . Osteoarthrosis involving lower leg 07/15/2018  . Tear of medial meniscus of knee joint 07/15/2018  . Thrombocytopenia, unspecified ([place]) 07/01/2018  . Palliative care encounter   . Atrial fibrillation with RVR ([place]) 05/30/2018  . New onset a-fib ([place]) 05/30/2018  . Malnutrition of moderate degree 05/04/2018  . Hypercalcemia 04/18/2018  . Metastatic breast cancer ([place])   . Pulmonary embolism ([place]) 02/27/2018  . Acute deep vein thrombosis (DVT) of proximal vein of right lower extremity ([place]) 01/22/2018  . 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  . Patient is Jehovah's Witness   . Refusal of blood transfusions as patient is Jehovah's Witness   . High risk medication use 09/25/2017  . Lower extremity edema 09/25/2017  . S/P laparoscopic appendectomy 08/22/2017  . Goals of care, counseling/discussion 01/20/2017   . Multinodular thyroid 02/08/2016  . Lumbar back pain with radiculopathy affecting left lower extremity 02/08/2016  . Carcinoma of breast metastatic to bone ([place]) 02/08/2016  . Bone metastases ([place]) 11/13/2015  . Pain, cancer 06/04/2015  . Glaucoma suspect of right eye 04/18/2015  . Allergic rhinitis due to pollen 06/30/2014  . History of left mastectomy 01/15/2014  . History of uterine fibroid 01/15/2014  . Advance directive on file 01/14/2014  . Essential hypertension 12/24/2013  . Left ventricular diastolic dysfunction with preserved systolic function 01/24/5944  . Amblyopia, both eyes 07/29/2013  . Cataract 07/29/2013  . High myopia 07/29/2013  . Amblyopia 07/29/2013  . Morbid (severe) obesity due to excess calories ([place]) 06/23/2013  . GERD (gastroesophageal reflux disease) 03/25/2013  . Constipation 03/25/2013  . Other and unspecified hyperlipidemia 03/25/2013  . Malignant neoplasm of overlapping sites of left breast in female, estrogen receptor positive ([place]) 03/05/2013  . Hyperlipidemia 11/13/2011  . Dysplasia of cervix, low grade ([facility]) 08/22/2011  . Diastolic dysfunction 85/92/9244  . Obesity 03/04/2011  . OSA (obstructive sleep apnea) 02/27/2011  . Asthma 02/08/2011    Palliative Care Assessment & Plan   Patient Profile:   58 y.o. female  with past medical history of metastatic breast cancer to bones and liver, pelvic DVT with multiple pulmonary embolism has IVC filter in place and on Lovenox, hydronephrosis secondary to tumor burden, asthma, GERD, hypertension, hyperlipidemia, Jehovah's Witness  admitted on 09/01/2018 with generalized fatigue and overall decline in her condition and failure to thrive.      Assessment:  Advanced metastatic breast cancer/failure to thrive in adult Elevated LFTs: Secondary to metastatic cancer to liver.    Anemia/thrombocytopenia: Hemoglobin 6.7.      Denies blood transfusion due to being Jehovah's Witness. History of DVT PE  Functional decline.  Recommendations/Plan:   Call placed, unable to reach daughter Charlotta Newton at [phone]. Yesterday, during initial consult and family meeting, she expressed wish to come in and see her mother, and to have a face to face discussion with her about hospice options.   Continue current mode of care  Agree with current prns for symptom management.    Code Status:    Code Status Orders  (From admission, onward)         Start     Ordered   09/01/18 2301  Do not attempt resuscitation (DNR)  Continuous    Question Answer Comment  In the event of cardiac or respiratory ARREST Do not call a "code blue"   In the event of cardiac or respiratory ARREST Do not perform Intubation, CPR, defibrillation or ACLS   In the event of cardiac or respiratory ARREST Use medication by any route, position, wound care, and other measures to relive pain and suffering. May use oxygen, suction and manual treatment of airway obstruction as needed for comfort.      09/01/18 2300        Code Status History    Date Active Date Inactive Code Status Order ID Comments User Context   05/30/2018 1646 06/06/2018 1714 Full Code [id number]  Barb Merino, MD Inpatient   05/03/2018 0242 05/08/2018 2154 Full Code [id number]  Rise Patience, MD ED   04/25/2018 0541 04/28/2018 2118 Full Code [id number]  Rise Patience, MD ED   04/18/2018 1753 04/22/2018 2015 Full Code [id number]  Kayleen Memos, DO Inpatient   02/27/2018 2216 03/08/2018 1806 Full Code [id number]  Merton Border, MD Inpatient   01/02/2018 0127 01/04/2018 1637 Full Code [id number]  Shela Leff, MD ED   08/22/2017 1714 08/23/2017 1502 Full Code [id number]  Coralie Keens, MD Inpatient   10/26/2014 1215 10/28/2014 1749 Full Code [id number]  Molli Posey, MD Inpatient   10/01/2012 2334 10/02/2012 2028 Full Code [id number]  Delfina Redwood, MD Inpatient   03/05/2011 0323 03/06/2011 1804 Full Code [id number]  Leanora Ivanoff, RN  Inpatient       Prognosis:   < 2 weeks  Discharge Planning:  Hospice facility is recommended, ongoing discussions with patient and family to facilitate this and to help further clarify overall goals of care.  Care plan was discussed with patient, call placed, unable to reach daughter. Also discussed with bedside RN.   Thank you for allowing the Palliative Medicine Team to assist in the care of this patient.   Time In: 11 Time Out: 11.35 Total Time 35 Prolonged Time Billed  no       Greater than 50%  of this time was spent counseling and coordinating care related to the above assessment and plan.  Loistine Chance, MD [phone] Please contact Palliative Medicine Team phone at [phone] for questions and concerns.

## 2018-09-03 NOTE — Progress Notes (Signed)
OT Cancellation Note  Patient Details Name: CASEE KNEPP MRN: [id number] DOB: Jul 02, 1960   Cancelled Treatment:    Reason Eval/Treat Not Completed: Pain limiting ability to participate per RN. She is due for more medication around 2:00. Will return after that.  Tam Delisle 09/03/2018, 11:54 AM  Lesle Chris, OTR/L Acute Rehabilitation Services [phone] [facility] pager [phone] office 09/03/2018

## 2018-09-04 DIAGNOSIS — R740 Nonspecific elevation of levels of transaminase and lactic acid dehydrogenase [LDH]: Secondary | ICD-10-CM

## 2018-09-04 DIAGNOSIS — Z66 Do not resuscitate: Secondary | ICD-10-CM

## 2018-09-04 MED ORDER — SIMETHICONE 80 MG PO CHEW
80.0000 mg | CHEWABLE_TABLET | Freq: Once | ORAL | Status: AC
  Administered 2018-09-04: 80 mg via ORAL
  Filled 2018-09-04: qty 1

## 2018-09-04 NOTE — Progress Notes (Addendum)
Manufacturing engineer (ACC)    Received referral for hospice services at home.  Called daughter and left message asking for her to return call.    Will update once contact made and d/c plan is in place.  **Update 11AM, spoke with pt on her phone, she confirms hospice services at home.  All DME is already in place and provided through [employer].  She has a bed, bedside commode, shower chair, walker, wheelchair and O2 2lpm PRN.  She states her daughter will be picking her up today.  I left my contact information with pt for her daughter to call once she arrives at hospital.  Thank you, Venia Carbon RN, BSN, [facility] (in [place]) [phone]

## 2018-09-04 NOTE — Progress Notes (Signed)
PT Cancellation Note / Screen  Patient Details Name: RONDI IVY MRN: [id number] DOB: 1960/12/09   Cancelled Treatment:    Reason Eval/Treat Not Completed: PT screened, no needs identified, will sign off Per palliative care notes, pt has poor prognosis and pt plans to d/c home with hospice. She has been getting up to [facility] with +1 nursing staff.  Pain has been a limiting factor in mobility.  Recommend nursing mobilize pt as tolerated.  PT to sign off.   Liberti Appleton,KATHrine E 09/04/2018, 12:54 PM Carmelia Bake, PT, DPT Acute Rehabilitation Services Office: [phone] Pager: [phone]

## 2018-09-04 NOTE — Progress Notes (Addendum)
After patient's discharge were done according to patient wishes of going home with hospice as she had expressed in the morning when I saw her. she changed her mind when she was seen by her oncologist and wanted to go to [facility] instead.  Social worker on board to find placement however there is no availability today so she would like to be discharged tomorrow.

## 2018-09-04 NOTE — Discharge Summary (Signed)
Physician Discharge Summary  JOURNIE Michaela Little ERX:[id number] DOB: Oct 06, 1960 DOA: 09/01/2018  PCP: Ladell Pier, MD  Admit date: 09/01/2018 Discharge date: 09/04/2018  Admitted From: Home Disposition: Home with hospice  Recommendations for Outpatient Follow-up:  1. Follow up with PCP in 1-2 weeks 2. Please obtain BMP/CBC in one week 3. Please follow up on the following pending results:  Home Health: Yes Equipment/Devices: None  Discharge Condition: Fair CODE STATUS: DNR Diet recommendation: Regular  Subjective: Seen and examined.  Complains of generalized weakness.  No other complaint.  She told me that she has finally decided to go home with hospice and would like to go home today.  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 Upon admission she was also found to be anemic but no transfusion was given since she had refused for it due to being Jehovah's Witness.  She refused to take any further chemotherapy for her cancer and finally she decided to go home with hospice.  She has been seen by oncology.  She was also seen by palliative care.  Hospice has been arranged for her so she is going to be discharged home.  Discharge Diagnoses:  Principal Problem:   Failure to thrive in adult Active Problems:   Asthma   Diastolic dysfunction   Malignant neoplasm of overlapping sites of left breast in female, estrogen receptor positive (HCC)   GERD (gastroesophageal reflux disease)   Constipation   Essential hypertension   Bone metastases (HCC)   Anemia in neoplastic disease   Pulmonary embolism (HCC)   Metastatic breast cancer (HCC)   Elevated  LFTs   Palliative care by specialist    Discharge Instructions  Discharge Instructions    Discharge patient   Complete by: As directed    Discharge disposition: 50-Hospice/Home   Discharge patient date: 09/04/2018     Allergies as of 09/04/2018      Reactions   Lisinopril-hydrochlorothiazide Itching   Adhesive [tape] Itching, Other (See Comments)   Redness   Fentanyl Other (See Comments)   GI upset and drowsiness. Only to [facility]   Gabapentin Other (See Comments)   Auditory hallucinations   Hctz [hydrochlorothiazide] Itching   Latex Itching   Lisinopril Itching   Losartan Potassium Other (See Comments)   Makes her feel "bad"   Other Other (See Comments)   Patient refuses blood for religious reasons (per her notes)   Oxycodone    GI upset, drowsy   Tums [calcium Carbonate Antacid] Hives   FRUIT-FLAVORED ONES      Medication List    TAKE these medications   acetaminophen 500 MG tablet Commonly known as: TYLENOL Take 500 mg by mouth every 6 (six) hours as needed for mild pain.   carvedilol 6.25 MG tablet Commonly known as: COREG Take 6.25 mg by mouth 2 (two) times daily with a meal.   carvedilol 12.5 MG tablet Commonly known as: COREG Take 1 tablet (12.5 mg total) by mouth 2 (two) times daily with a meal.   cetirizine 10 MG tablet Commonly known as: ZYRTEC Take 10 mg by mouth daily as needed for allergies or rhinitis.   docusate sodium 100 MG capsule Commonly known as: COLACE Take 1 capsule (  100 mg total) by mouth daily as needed for mild constipation. What changed:   how much to take  when to take this   doxycycline 100 MG capsule Commonly known as: VIBRAMYCIN Take 1 capsule (100 mg total) by mouth 2 (two) times daily.   feeding supplement (ENSURE ENLIVE) Liqd Take 237 mLs by mouth 2 (two) times daily between meals. What changed:   when to take this  additional instructions   hydrALAZINE 10 MG tablet Commonly known as: APRESOLINE Take 1  tablet (10 mg total) by mouth 3 (three) times daily.   ipratropium-albuterol 0.5-2.5 (3) MG/3ML Soln Commonly known as: DUONEB INHALE THREE MLS VIA NEBULIZER EVERY 6 HOURS AS NEEDED What changed: See the new instructions.   ondansetron 8 MG tablet Commonly known as: ZOFRAN Take 8 mg by mouth every 8 (eight) hours as needed for nausea or vomiting.   traMADol 50 MG tablet Commonly known as: ULTRAM Take 2 tablets (100 mg total) by mouth every 6 (six) hours. What changed:   how much to take  when to take this  reasons to take this      Follow-up Information    Ladell Pier, MD Follow up in 1 week(s).   Specialty: Internal Medicine Contact information: [address] [place] [address] [phone]          Allergies  Allergen Reactions  . Lisinopril-Hydrochlorothiazide Itching  . Adhesive [Tape] Itching and Other (See Comments)    Redness  . Fentanyl Other (See Comments)    GI upset and drowsiness. Only to [facility]  . Gabapentin Other (See Comments)    Auditory hallucinations  . Hctz [Hydrochlorothiazide] Itching  . Latex Itching  . Lisinopril Itching  . Losartan Potassium Other (See Comments)    Makes her feel "bad"   . Other Other (See Comments)    Patient refuses blood for religious reasons (per her notes)  . Oxycodone     GI upset, drowsy  . Tums [Calcium Carbonate Antacid] Hives    FRUIT-FLAVORED ONES    Consultations: Palliative care and oncology   Procedures/Studies: Ct Abdomen Pelvis Wo Contrast  Result Date: 09/01/2018 CLINICAL DATA:  58 year old female with stage IV breast cancer. Has stopped all cancer treatment. Abdominal distension, increased fatigue. EXAM: CT ABDOMEN AND PELVIS WITHOUT CONTRAST TECHNIQUE: Multidetector CT imaging of the abdomen and pelvis was performed following the standard protocol without IV contrast. COMPARISON:  PET-CT 07/25/2018.  CT Abdomen and Pelvis 05/03/2018. FINDINGS: Lower chest: Negative lung  bases. No pericardial or pleural effusion. Hepatobiliary: Hepatomegaly with diffuse metastatic infiltration of the liver. Liver size has substantially increased since February, cc liver length is 24 centimeters now, 18 centimeters previously. Surgically absent gallbladder. Pancreas: Negative. Spleen: Spleen size is at the upper limits of normal, estimated splenic volume 388 milliliters (normal splenic volume range 83 - 412 mL). No discrete splenic lesion in the absence of IV contrast. Adrenals/Urinary Tract: Stable adrenal glands. There is new right hydronephrosis and right renal atrophy since February, but no right hydroureter. Punctate right lower pole nephrolithiasis. There is mild left hydronephrosis but also no hydroureter. 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 Unremarkable urinary bladder. Stomach/Bowel: No dilated large or small bowel loops. Distal small bowel appears chronically somewhat matted together in the lower abdomen (on series 2, image 68), unchanged. Evidence of prior appendectomy. Decreased retained stool in the colon compared to February. Trace free fluid in both gutters. There may also be trace free fluid in  the distal small bowel mesentery. No free air. Negative stomach. Vascular/Lymphatic: Vascular patency is not evaluated in the absence of IV contrast. Mild Calcified aortic atherosclerosis. Stable IVC filter. Reproductive: Surgically absent uterus. Diminutive or absent ovaries. Other: No pelvic free fluid. Musculoskeletal: Mixed lytic and sclerotic bone metastases throughout the entire visible skeleton. Stable visible vertebral height and alignment since February. No pathologic pelvic or proximal femur fracture identified. There are intermittent bilateral pathologic nondisplaced rib fractures. IMPRESSION: 1. Severe metastatic disease to the liver with hepatomegaly and progression since February. Liver length has increased from 18 to 24 cm since that time. 2. Right  greater than left hydronephrosis with a degree of right renal atrophy is new since February. 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 3. Trace abdominal free fluid. No bowel obstruction or inflammation. 4. Diffuse skeletal metastatic disease. Bilateral pathologic nondisplaced rib fractures. Electronically Signed   By: Genevie Ann M.D.   On: 09/01/2018 20:37   Dg Ribs Bilateral W/chest  Result Date: 09/01/2018 CLINICAL DATA:  Bilateral rib pain after fall yesterday. EXAM: BILATERAL RIBS AND CHEST - 4+ VIEW COMPARISON:  PET-CT dated Jul 25, 2018. Chest and right rib x-rays dated June 05, 2018. FINDINGS: No definite new rib fracture. Subacute right fourth through eighth and tenth through twelfth rib fractures again noted. Subacute left sixth and ninth through eleventh rib fractures again noted. Diffuse osseous metastatic disease is unchanged. The heart size and mediastinal contours are within normal limits. Normal pulmonary vascularity. No focal consolidation, pleural effusion, or pneumothorax. Prior cholecystectomy. IVC filter. IMPRESSION: 1. Bilateral subacute pathologic rib fractures due to underlying diffuse osseous metastatic disease, similar to prior PET-CT. No definite new rib fracture. 2.  No active cardiopulmonary disease. Electronically Signed   By: Titus Dubin M.D.   On: 09/01/2018 15:08   Dg Abd 1 View  Result Date: 09/02/2018 CLINICAL DATA:  Abdominal distension, constipation, history asthma, GERD, hypertension breast cancer EXAM: ABDOMEN - 1 VIEW COMPARISON:  Portable exam 0718 hours compared to CT of 09/01/2018 FINDINGS: Surgical clips RIGHT upper quadrant from cholecystectomy. IVC filter noted. Diffuse scattered sclerosis and lucency throughout osseous structures compatible with widespread osseous metastatic disease. Normal bowel gas pattern. No bowel dilatation or bowel wall thickening. IMPRESSION: Widespread osseous metastatic disease. Nonobstructive bowel gas pattern.  Electronically Signed   By: Lavonia Dana M.D.   On: 09/02/2018 08:03   Ct Head Wo Contrast  Result Date: 09/01/2018 CLINICAL DATA:  58 year old female with stage IV breast cancer. Has stopped all cancer treatment. Abdominal distension, increased fatigue.  Fall 2 days ago. EXAM: CT HEAD WITHOUT CONTRAST TECHNIQUE: Contiguous axial images were obtained from the base of the skull through the vertex without intravenous contrast. COMPARISON:  PET-CT 07/25/2018.  Head CT 02/02/2016. FINDINGS: Brain: Cerebral volume remains normal. No midline shift, ventriculomegaly, mass effect, intracranial hemorrhage or evidence of cortically based acute infarction. Gray-white matter differentiation is within normal limits throughout the brain. No cerebral edema identified. However, there is suspicion of asymmetric dural thickening along the right clinoid process and sphenoid wing (series 2, image 8 and series 5, image 26. there is associated partial effacement of the regional sulci but no significant mass effect. Vascular: Mild Calcified atherosclerosis at the skull base. Skull: Progressed since 2017 and diffuse lytic osseous lesions throughout the visible skull. Prominent osteolysis of the clivus. Sinuses/Orbits: Visualized paranasal sinuses and mastoids are well pneumatized. Other: No acute orbit or scalp soft tissue finding identified. IMPRESSION: 1.  No recent traumatic  injury identified. 2. 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 Electronically Signed   By: Genevie Ann M.D.   On: 09/01/2018 22:17      Discharge Exam: Vitals:   09/04/18 0544 09/04/18 0812  BP: (!) 104/54 104/67  Pulse: 70   Resp: 16   Temp: 97.6 F (36.4 C)   SpO2: 100%    Vitals:   09/03/18 1253 09/03/18 2140 09/04/18 0544 09/04/18 0812  BP: 104/60 (!) 111/53 (!) 104/54 104/67  Pulse: 66 67 70   Resp: 18 12 16    Temp:  97.7 F (36.5 C) 97.9 F (36.6 C) 97.6 F (36.4 C)   TempSrc: Oral Oral Oral   SpO2: 100% 98% 100%   Weight:      Height:        General: Pt is alert, awake, not in acute distress Cardiovascular: RRR, S1/S2 +, no rubs, no gallops Respiratory: CTA bilaterally, no wheezing, no rhonchi Abdominal: Soft, NT, ND, bowel sounds + Extremities: no edema, no cyanosis    The results of significant diagnostics from this hospitalization (including imaging, microbiology, ancillary and laboratory) are listed below for reference.     Microbiology: Recent Results (from the past 240 hour(s))  SARS Coronavirus 2 (CEPHEID - Performed in [place] hospital lab), Hosp Order     Status: None   Collection Time: 09/01/18  8:49 PM   Specimen: Nasopharyngeal Swab  Result Value Ref Range Status   SARS Coronavirus 2 NEGATIVE NEGATIVE Final    Comment: (NOTE) If result is NEGATIVE SARS-CoV-2 target nucleic acids are NOT DETECTED. The SARS-CoV-2 RNA is generally detectable in upper and lower  respiratory specimens during the acute phase of infection. The lowest  concentration of SARS-CoV-2 viral copies this assay can detect is 250  copies / mL. A negative result does not preclude SARS-CoV-2 infection  and should not be used as the sole basis for treatment or other  patient management decisions.  A negative result may occur with  improper specimen collection / handling, submission of specimen other  than nasopharyngeal swab, presence of viral mutation(s) within the  areas targeted by this assay, and inadequate number of viral copies  (<250 copies / mL). A negative result must be combined with clinical  observations, patient history, and epidemiological information. If result is POSITIVE SARS-CoV-2 target nucleic acids are DETECTED. The SARS-CoV-2 RNA is generally detectable in upper and lower  respiratory specimens dur ing the acute phase of infection.  Positive  results are indicative of active  infection with SARS-CoV-2.  Clinical  correlation with patient history and other diagnostic information is  necessary to determine patient infection status.  Positive results do  not rule out bacterial infection or co-infection with other viruses. If result is PRESUMPTIVE POSTIVE SARS-CoV-2 nucleic acids MAY BE PRESENT.   A presumptive positive result was obtained on the submitted specimen  and confirmed on repeat testing.  While 2019 novel coronavirus  (SARS-CoV-2) nucleic acids may be present in the submitted sample  additional confirmatory testing may be necessary for epidemiological  and / or clinical management purposes  to differentiate between  SARS-CoV-2 and other Sarbecovirus currently known to infect humans.  If clinically indicated additional testing with an alternate test  methodology [phone]) is advised. The SARS-CoV-2 RNA is generally  detectable in upper and lower respiratory sp ecimens during the acute  phase of infection. The expected result  is Negative. Fact Sheet for Patients:  StrictlyIdeas.no Fact Sheet for Healthcare Providers: BankingDealers.co.za This test is not yet approved or cleared by the [place] FDA and has been authorized for detection and/or diagnosis of SARS-CoV-2 by FDA under an Emergency Use Authorization (EUA).  This EUA will remain in effect (meaning this test can be used) for the duration of the COVID-19 declaration under Section 564(b)(1) of the Act, 21 U.S.C. section 360bbb-3(b)(1), unless the authorization is terminated or revoked sooner. Performed at [facility], [place] [address] [place], [place] [address]      Labs: BNP (last 3 results) Recent Labs    01/01/18 1838 02/27/18 1237 05/02/18 2221  BNP 25.8 15.6 95.2   Basic Metabolic Panel: Recent Labs  Lab 09/01/18 1447 09/02/18 0414  NA 133* 133*  K 4.2 4.1  CL 102 105  CO2 19* 20*  GLUCOSE 107* 103*   BUN 21* 22*  CREATININE 1.17* 1.01*  CALCIUM 12.0* 11.1*  MG  --  2.0  PHOS  --  2.5   Liver Function Tests: Recent Labs  Lab 09/01/18 1447 09/02/18 0414  AST 224* 184*  ALT 45* 42  ALKPHOS 485* 365*  BILITOT 4.8* 4.5*  PROT 6.7 5.9*  ALBUMIN 2.7* 2.6*   No results for input(s): LIPASE, AMYLASE in the last 168 hours. No results for input(s): AMMONIA in the last 168 hours. CBC: Recent Labs  Lab 09/01/18 1447 09/02/18 0414  WBC 4.6 3.2*  NEUTROABS 2.5  --   HGB 8.0* 6.7*  HCT 27.8* 23.4*  MCV 94.9 96.7  PLT 63* 49*   Cardiac Enzymes: No results for input(s): CKTOTAL, CKMB, CKMBINDEX, TROPONINI in the last 168 hours. BNP: Invalid input(s): POCBNP CBG: No results for input(s): GLUCAP in the last 168 hours. D-Dimer No results for input(s): DDIMER in the last 72 hours. Hgb A1c Recent Labs    09/01/18 1447  HGBA1C 4.7*   Lipid Profile No results for input(s): CHOL, HDL, LDLCALC, TRIG, CHOLHDL, LDLDIRECT in the last 72 hours. Thyroid function studies Recent Labs    09/02/18 0414  TSH 3.284   Anemia work up No results for input(s): VITAMINB12, FOLATE, FERRITIN, TIBC, IRON, RETICCTPCT in the last 72 hours. Urinalysis    Component Value Date/Time   COLORURINE STRAW (A) 05/31/2018 1717   APPEARANCEUR CLEAR 05/31/2018 1717   LABSPEC 1.016 05/31/2018 1717   LABSPEC 1.025 01/11/2016 1233   PHURINE 6.0 05/31/2018 1717   GLUCOSEU NEGATIVE 05/31/2018 1717   GLUCOSEU Negative 01/11/2016 1233   HGBUR NEGATIVE 05/31/2018 1717   BILIRUBINUR NEGATIVE 05/31/2018 1717   BILIRUBINUR small 12/03/2016 1117   BILIRUBINUR Negative 01/11/2016 1233   KETONESUR NEGATIVE 05/31/2018 1717   PROTEINUR NEGATIVE 05/31/2018 1717   UROBILINOGEN 1.0 12/03/2016 1117   UROBILINOGEN 1.0 09/13/2016 1306   UROBILINOGEN 0.2 01/11/2016 1233   NITRITE NEGATIVE 05/31/2018 1717   LEUKOCYTESUR NEGATIVE 05/31/2018 1717   LEUKOCYTESUR Small 01/11/2016 1233   Sepsis Labs Invalid input(s):  PROCALCITONIN,  WBC,  LACTICIDVEN Microbiology Recent Results (from the past 240 hour(s))  SARS Coronavirus 2 (CEPHEID - Performed in [place] hospital lab), Hosp Order     Status: None   Collection Time: 09/01/18  8:49 PM   Specimen: Nasopharyngeal Swab  Result Value Ref Range Status   SARS Coronavirus 2 NEGATIVE NEGATIVE Final    Comment: (NOTE) If result is NEGATIVE SARS-CoV-2 target nucleic acids are NOT DETECTED. The SARS-CoV-2 RNA is generally detectable in upper and lower  respiratory specimens  during the acute phase of infection. The lowest  concentration of SARS-CoV-2 viral copies this assay can detect is 250  copies / mL. A negative result does not preclude SARS-CoV-2 infection  and should not be used as the sole basis for treatment or other  patient management decisions.  A negative result may occur with  improper specimen collection / handling, submission of specimen other  than nasopharyngeal swab, presence of viral mutation(s) within the  areas targeted by this assay, and inadequate number of viral copies  (<250 copies / mL). A negative result must be combined with clinical  observations, patient history, and epidemiological information. If result is POSITIVE SARS-CoV-2 target nucleic acids are DETECTED. The SARS-CoV-2 RNA is generally detectable in upper and lower  respiratory specimens dur ing the acute phase of infection.  Positive  results are indicative of active infection with SARS-CoV-2.  Clinical  correlation with patient history and other diagnostic information is  necessary to determine patient infection status.  Positive results do  not rule out bacterial infection or co-infection with other viruses. If result is PRESUMPTIVE POSTIVE SARS-CoV-2 nucleic acids MAY BE PRESENT.   A presumptive positive result was obtained on the submitted specimen  and confirmed on repeat testing.  While 2019 novel coronavirus  (SARS-CoV-2) nucleic acids may be present in the  submitted sample  additional confirmatory testing may be necessary for epidemiological  and / or clinical management purposes  to differentiate between  SARS-CoV-2 and other Sarbecovirus currently known to infect humans.  If clinically indicated additional testing with an alternate test  methodology [phone]) is advised. The SARS-CoV-2 RNA is generally  detectable in upper and lower respiratory sp ecimens during the acute  phase of infection. The expected result is Negative. Fact Sheet for Patients:  StrictlyIdeas.no Fact Sheet for Healthcare Providers: BankingDealers.co.za This test is not yet approved or cleared by the [place] FDA and has been authorized for detection and/or diagnosis of SARS-CoV-2 by FDA under an Emergency Use Authorization (EUA).  This EUA will remain in effect (meaning this test can be used) for the duration of the COVID-19 declaration under Section 564(b)(1) of the Act, 21 U.S.C. section 360bbb-3(b)(1), unless the authorization is terminated or revoked sooner. Performed at [facility], [place] [address] [place], [place] [address]      Time coordinating discharge: 40 minutes  SIGNED:   Darliss Cheney, MD  [facility] 09/04/2018, 1:18 PM Pager [phone]  If 7PM-7AM, please contact night-coverage [email or web address] Password [email or web address]

## 2018-09-04 NOTE — TOC Progression Note (Addendum)
Transition of Care [facility]) - Progression Note    Patient Details  Name: Michaela Little MRN: [id number] Date of Birth: 06-10-1960  Transition of Care [facility]) CM/SW Contact  Leeroy Cha, RN Phone Number: 09/04/2018, 9:31 AM  Clinical Narrative:    [phone] tct-Jennifer Woody with hospice and palliative care referral given. 1347/ patient now wants to go to [place] notified.  Expected Discharge Plan: Home w Hospice Care Barriers to Discharge: No Barriers Identified  Expected Discharge Plan and Services Expected Discharge Plan: Home w Hospice Care In-house Referral: Clinical Social Work, Hospice / Palliative Care Discharge Planning Services: CM Consult Post Acute Care Choice: Hospice Living arrangements for the past 2 months: Single Family Home                                       Social Determinants of Health (SDOH) Interventions    Readmission Risk Interventions No flowsheet data found.

## 2018-09-04 NOTE — Discharge Instructions (Signed)
Hospice °Hospice is a service that is designed to provide people who are terminally ill and their families with medical, spiritual, and psychological support. Its aim is to improve your quality of life by keeping you as comfortable as possible in the final stages of life. °Who will be my providers when I begin hospice care? °Hospice teams often include: °· A nurse. °· A doctor. The hospice doctor will be available for your care, but you can include your regular doctor or nurse practitioner. °· A social worker. °· A counselor. °· A religious leader (such as a chaplain). °· A dietitian. °· Therapists. °· Trained volunteers who can help with care. °What services does hospice provide? °Hospice services can vary depending on the center or organization. Generally, they include: °· Ways to keep you comfortable, such as: °? Providing care in your home or in a home-like setting. °? Working with your family and friends to help meet your needs. °? Allowing you to enjoy the support of loved ones by receiving much of your basic care from family and friends. °· Pain relief and symptom management. The staff will supply all necessary medicines and equipment so that you can stay comfortable and alert enough to enjoy the company of your friends and family. °· Visits or care from a nurse and doctor. This may include 24-hour on-call services. °· Companionship when you are alone. °· Allowing you and your family to rest. Hospice staff may do light housekeeping, prepare meals, and run errands. °· Counseling. They will make sure your emotional, spiritual, and social needs are being met, as well as those needs of your family members. °· Spiritual care. This will be individualized to meet your needs and your family's needs. It may involve: °? Helping you and your family understand the dying process. °? Helping you say goodbye to your family and friends. °? Performing a specific religious ceremony or ritual. °· Massage. °· Nutrition  therapy. °· Physical and occupational therapy. °· Short-term inpatient care, if something cannot be managed in the home. °· Art or music therapy. °· Bereavement support for grieving family members. °When should hospice care begin? °Most people who use hospice are believed to have less than 6 months to live. °· Your family and health care providers can help you decide when hospice services should begin. °· If you live longer than 6 months but your condition does not improve, your doctor may be able to approve you for continued hospice care. °· If your condition improves, you may discontinue the program. °What should I consider before selecting a program? °Most hospice programs are run by nonprofit, independent organizations. Some are affiliated with hospitals, nursing homes, or home health care agencies. Hospice programs can take place in your home or at a hospice center, hospital, or skilled nursing facility. When choosing a hospice program, ask the following questions: °· What services are available to me? °· What services will be offered to my loved ones? °· How involved will my loved ones be? °· How involved will my health care provider be? °· Who makes up the hospice care team? How are they trained or screened? °· How will my pain and symptoms be managed? °· If my circumstances change, can the services be provided in a different setting, such as my home or in the hospital? °· Is the program reviewed and licensed by the state or certified in some other way? °· What does it cost? Is it covered by insurance? °· If I choose a hospice   center or nursing home, where is the hospice center located? Is it convenient for family and friends? °· If I choose a hospice center or nursing home, can my family and friends visit any time? °· Will you provide emotional and spiritual support? °· Who can my family call with questions? °Where can I learn more about hospice? °You can learn about existing hospice programs in your area  from your health care providers. You can also read more about hospice online. The websites of the following organizations have helpful information: °· National Hospice and Palliative Care Organization (NHPCO): www.nhpco.org °· National Association for Home Care & Hospice (NAHC): www.nahc.org °· Hospice Foundation of America (HFA): www.hospicefoundation.org °· American Cancer Society (ACS): www.cancer.org °· Hospice Net: www.hospicenet.org °· Visiting Nurse Associations of America (VNAA): www.vnaa.org °You may also find more information by contacting the following agencies: °· A local agency on aging. °· Your local United Way chapter. °· Your state's department of health or social services. °Summary °· Hospice is a service that is designed to provide people who are terminally ill and their families with medical, spiritual, and psychological support. °· Hospice aims to improve your quality of life by keeping you as comfortable as possible in the final stages of life. °· Hospice teams often include a doctor, nurse, social worker, counselor, religious leader,dietitian, therapists, and volunteers. °· Hospice care generally includes medicine for symptom management, visits from doctors and nurses, physical and occupational therapy, nutrition counseling, spiritual and emotional counseling, caregiver support, and bereavement support for grieving family members. °· Hospice programs can take place in your home or at a hospice center, hospital, or skilled nursing facility. °This information is not intended to replace advice given to you by your health care provider. Make sure you discuss any questions you have with your health care provider. °Document Released: 06/29/2003 Document Revised: 04/03/2016 Document Reviewed: 04/03/2016 °Elsevier Interactive Patient Education © 2019 Elsevier Inc. ° °

## 2018-09-04 NOTE — Progress Notes (Signed)
Manufacturing engineer [facility]) [facility]  Referral changed to residential hospice at [facility] to meet Ms. Scotts EOL needs.  We do not have a bed to offer today.    Will follow up with [facility] manager and family in am with any updates.  Thank you, Venia Carbon RN, BSN, [facility] Liaison [phone]

## 2018-09-04 NOTE — Progress Notes (Signed)
Daily Progress Note   Patient Name: Michaela Little       Date: 09/04/2018 DOB: January 07, 1961  Age: 58 y.o. MRN#: [id number] Attending Physician: Darliss Cheney, MD Primary Care Physician: Ladell Pier, MD Admit Date: 09/01/2018  Reason for Consultation/Follow-up: Establishing goals of care  Subjective:  patient is awake, reasonably alert Resting in bed Denies complaints Ate some    Length of Stay: 2  Current Medications: Scheduled Meds:  . carvedilol  6.25 mg Oral BID WC  . darbepoetin (ARANESP) injection - NON-DIALYSIS  250 mcg Subcutaneous Q Tue-1800  . docusate sodium  200 mg Oral QODAY  . feeding supplement (ENSURE ENLIVE)  237 mL Oral TID BM  .  morphine injection  4 mg Intravenous Once  . senna  1 tablet Oral BID    Continuous Infusions:   PRN Meds: acetaminophen **OR** acetaminophen, bisacodyl, ipratropium-albuterol, lactulose, morphine injection, ondansetron **OR** ondansetron (ZOFRAN) IV, polyethylene glycol, traMADol  Physical Exam         Awakens easily Appears weak and chronically ill Has some edema generalized Regular S 1 S2  Abdomen somewhat distended  Vital Signs: BP 104/67 (BP Location: Right Leg)   Pulse 70   Temp 97.6 F (36.4 C) (Oral)   Resp 16   Ht 5\' 7"  (1.702 m)   Wt 85.6 kg   LMP 02/25/2009   SpO2 100%   BMI 29.55 kg/m  SpO2: SpO2: 100 % O2 Device: O2 Device: Room Air O2 Flow Rate:    Intake/output summary:   Intake/Output Summary (Last 24 hours) at 09/04/2018 1030 Last data filed at 09/04/2018 0935 Gross per 24 hour  Intake 540 ml  Output 200 ml  Net 340 ml   LBM: Last BM Date: 09/04/18 Baseline Weight: Weight: 83.9 kg Most recent weight: Weight: 85.6 kg       Palliative Assessment/Data:    Flowsheet Rows     Most  Recent Value  Intake Tab  Referral Department  Hospitalist  Unit at Time of Referral  Med/Surg Unit  Palliative Care Primary Diagnosis  Cancer  Palliative Care Type  New Palliative care  Reason for referral  Clarify Goals of Care  Date first seen by Palliative Care  09/02/18  Clinical Assessment  Palliative Performance Scale Score  30%  Pain Max last 24 hours  4  Pain Min Last 24 hours  3  Dyspnea Max Last 24 Hours  4  Dyspnea Min Last 24 hours  3  Nausea Max Last 24 Hours  3  Nausea Min Last 24 Hours  2  Anxiety Max Last 24 Hours  4  Anxiety Min Last 24 Hours  3  Psychosocial & Spiritual Assessment  Palliative Care Outcomes  Patient/Family meeting held?  Yes  Who was at the meeting?  Patient, also discussed with daughter over the phone      Patient Active Problem List   Diagnosis Date Noted  . Palliative care by specialist   . Failure to thrive in adult 09/02/2018  . Elevated LFTs 09/01/2018  . Vulvar lesion 08/15/2018  . Dysplasia of cervix 07/15/2018  . Non-toxic nodular goiter 07/15/2018  . Osteoarthrosis involving lower leg 07/15/2018  . Tear of medial meniscus of knee joint 07/15/2018  . Thrombocytopenia, unspecified ([place]) 07/01/2018  . Palliative care encounter   . Atrial fibrillation with RVR ([place]) 05/30/2018  . New onset a-fib ([place]) 05/30/2018  . Malnutrition of moderate degree 05/04/2018  . Hypercalcemia 04/18/2018  . Metastatic breast cancer ([place])   . Pulmonary embolism ([place]) 02/27/2018  . Acute deep vein thrombosis (DVT) of proximal vein of right lower extremity ([place]) 01/22/2018  . 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  . Patient is Jehovah's Witness   . Refusal of blood transfusions as patient is Jehovah's Witness   . High risk medication use 09/25/2017  . Lower extremity edema 09/25/2017  . S/P laparoscopic appendectomy 08/22/2017  . Goals of care, counseling/discussion 01/20/2017  . Multinodular thyroid 02/08/2016  . Lumbar back pain with  radiculopathy affecting left lower extremity 02/08/2016  . Carcinoma of breast metastatic to bone ([place]) 02/08/2016  . Bone metastases ([place]) 11/13/2015  . Pain, cancer 06/04/2015  . Glaucoma suspect of right eye 04/18/2015  . Allergic rhinitis due to pollen 06/30/2014  . History of left mastectomy 01/15/2014  . History of uterine fibroid 01/15/2014  . Advance directive on file 01/14/2014  . Essential hypertension 12/24/2013  . Left ventricular diastolic dysfunction with preserved systolic function 15/17/6160  . Amblyopia, both eyes 07/29/2013  . Cataract 07/29/2013  . High myopia 07/29/2013  . Amblyopia 07/29/2013  . Morbid (severe) obesity due to excess calories ([place]) 06/23/2013  . GERD (gastroesophageal reflux disease) 03/25/2013  . Constipation 03/25/2013  . Other and unspecified hyperlipidemia 03/25/2013  . Malignant neoplasm of overlapping sites of left breast in female, estrogen receptor positive ([place]) 03/05/2013  . Hyperlipidemia 11/13/2011  . Dysplasia of cervix, low grade (CIN 1) 08/22/2011  . Diastolic dysfunction 73/71/0626  . Obesity 03/04/2011  . OSA (obstructive sleep apnea) 02/27/2011  . Asthma 02/08/2011    Palliative Care Assessment & Plan   Patient Profile:   58 y.o. female  with past medical history of metastatic breast cancer to bones and liver, pelvic DVT with multiple pulmonary embolism has IVC filter in place and on Lovenox, hydronephrosis secondary to tumor burden, asthma, GERD, hypertension, hyperlipidemia, Jehovah's Witness  admitted on 09/01/2018 with generalized fatigue and overall decline in her condition and failure to thrive.      Assessment:  Advanced metastatic breast cancer/failure to thrive in adult Elevated LFTs: Secondary to metastatic cancer to liver.    Anemia/thrombocytopenia: Hemoglobin 6.7.      Denies blood transfusion due to being Jehovah's Witness. History of DVT PE Functional decline.   Recommendations/Plan:   not plans for the  patient going home with hospice,  agree with this plan, patient states this is her preference.   Continue current mode of care  Agree with current prns for symptom management.    Code Status:    Code Status Orders  (From admission, onward)         Start     Ordered   09/01/18 2301  Do not attempt resuscitation (DNR)  Continuous    Question Answer Comment  In the event of cardiac or respiratory ARREST Do not call a "code blue"   In the event of cardiac or respiratory ARREST Do not perform Intubation, CPR, defibrillation or ACLS   In the event of cardiac or respiratory ARREST Use medication by any route, position, wound care, and other measures to relive pain and suffering. May use oxygen, suction and manual treatment of airway obstruction as needed for comfort.      09/01/18 2300        Code Status History    Date Active Date Inactive Code Status Order ID Comments User Context   05/30/2018 1646 06/06/2018 1714 Full Code [id number]  Barb Merino, MD Inpatient   05/03/2018 0242 05/08/2018 2154 Full Code [id number]  Rise Patience, MD ED   04/25/2018 0541 04/28/2018 2118 Full Code [id number]  Rise Patience, MD ED   04/18/2018 1753 04/22/2018 2015 Full Code [id number]  Kayleen Memos, DO Inpatient   02/27/2018 2216 03/08/2018 1806 Full Code [id number]  Merton Border, MD Inpatient   01/02/2018 0127 01/04/2018 1637 Full Code [id number]  Shela Leff, MD ED   08/22/2017 1714 08/23/2017 1502 Full Code [id number]  Coralie Keens, MD Inpatient   10/26/2014 1215 10/28/2014 1749 Full Code [id number]  Molli Posey, MD Inpatient   10/01/2012 2334 10/02/2012 2028 Full Code [id number]  Delfina Redwood, MD Inpatient   03/05/2011 0323 03/06/2011 1804 Full Code [id number]  Leanora Ivanoff, RN Inpatient       Prognosis:   2-3 weeks.   Discharge Planning: Home with hospice, as per patient preference    Care plan was discussed with patient    Appreciate hospice follow up.   Thank  you for allowing the Palliative Medicine Team to assist in the care of this patient.   Time In: 10 Time Out: 10.25 Total Time 25 Prolonged Time Billed  no       Greater than 50%  of this time was spent counseling and coordinating care related to the above assessment and plan.  Loistine Chance, MD [phone] Please contact Palliative Medicine Team phone at [phone] for questions and concerns.

## 2018-09-04 NOTE — Progress Notes (Signed)
Pt asked to take something for gas pain. MD paged awaiting new orders.

## 2018-09-04 NOTE — Progress Notes (Addendum)
Michaela Little   DOB:April 15, 1960   MG#:[id number]   TOI#:[id number]  Oncology follow up   Subjective: Pt is overall stable, pain is reasonable controlled, appetite low, not eating much. She is more alert today, daughter at bedside.   Objective:  Vitals:   09/04/18 0544 09/04/18 0812  BP: (!) 104/54 104/67  Pulse: 70   Resp: 16   Temp: 97.6 F (36.4 C)   SpO2: 100%     Body mass index is 29.55 kg/m.  Intake/Output Summary (Last 24 hours) at 09/04/2018 1332 Last data filed at 09/04/2018 0935 Gross per 24 hour  Intake 540 ml  Output 200 ml  Net 340 ml     Sclerae unicteric  Oropharynx clear  No peripheral adenopathy  Lungs clear -- no rales or rhonchi  Heart regular rate and rhythm  Abdomen soft, (+) RUQ tenderness   Neuro nonfocal   CBG (last 3)  No results for input(s): GLUCAP in the last 72 hours.   Labs:  Urine Studies No results for input(s): UHGB, CRYS in the last 72 hours.  Invalid input(s): UACOL, UAPR, USPG, UPH, UTP, UGL, UKET, UBIL, UNIT, UROB, ULEU, UEPI, UWBC, URBC, UBAC, CAST, UCOM, BILUA  Basic Metabolic Panel: Recent Labs  Lab 09/01/18 1447 09/02/18 0414  NA 133* 133*  K 4.2 4.1  CL 102 105  CO2 19* 20*  GLUCOSE 107* 103*  BUN 21* 22*  CREATININE 1.17* 1.01*  CALCIUM 12.0* 11.1*  MG  --  2.0  PHOS  --  2.5   GFR Estimated Creatinine Clearance: 66.6 mL/min (A) (by C-G formula based on SCr of 1.01 mg/dL (H)). Liver Function Tests: Recent Labs  Lab 09/01/18 1447 09/02/18 0414  AST 224* 184*  ALT 45* 42  ALKPHOS 485* 365*  BILITOT 4.8* 4.5*  PROT 6.7 5.9*  ALBUMIN 2.7* 2.6*   No results for input(s): LIPASE, AMYLASE in the last 168 hours. No results for input(s): AMMONIA in the last 168 hours. Coagulation profile Recent Labs  Lab 09/02/18 0414  INR 1.3*    CBC: Recent Labs  Lab 09/01/18 1447 09/02/18 0414  WBC 4.6 3.2*  NEUTROABS 2.5  --   HGB 8.0* 6.7*  HCT 27.8* 23.4*  MCV 94.9 96.7  PLT 63* 49*   Cardiac  Enzymes: No results for input(s): CKTOTAL, CKMB, CKMBINDEX, TROPONINI in the last 168 hours. BNP: Invalid input(s): POCBNP CBG: No results for input(s): GLUCAP in the last 168 hours. D-Dimer No results for input(s): DDIMER in the last 72 hours. Hgb A1c Recent Labs    09/01/18 1447  HGBA1C 4.7*   Lipid Profile No results for input(s): CHOL, HDL, LDLCALC, TRIG, CHOLHDL, LDLDIRECT in the last 72 hours. Thyroid function studies Recent Labs    09/02/18 0414  TSH 3.284   Anemia work up No results for input(s): VITAMINB12, FOLATE, FERRITIN, TIBC, IRON, RETICCTPCT in the last 72 hours. Microbiology Recent Results (from the past 240 hour(s))  SARS Coronavirus 2 (CEPHEID - Performed in [place] hospital lab), Hosp Order     Status: None   Collection Time: 09/01/18  8:49 PM   Specimen: Nasopharyngeal Swab  Result Value Ref Range Status   SARS Coronavirus 2 NEGATIVE NEGATIVE Final    Comment: (NOTE) If result is NEGATIVE SARS-CoV-2 target nucleic acids are NOT DETECTED. The SARS-CoV-2 RNA is generally detectable in upper and lower  respiratory specimens during the acute phase of infection. The lowest  concentration of SARS-CoV-2 viral copies this assay can detect is 250  copies / mL. A negative result does not preclude SARS-CoV-2 infection  and should not be used as the sole basis for treatment or other  patient management decisions.  A negative result may occur with  improper specimen collection / handling, submission of specimen other  than nasopharyngeal swab, presence of viral mutation(s) within the  areas targeted by this assay, and inadequate number of viral copies  (<250 copies / mL). A negative result must be combined with clinical  observations, patient history, and epidemiological information. If result is POSITIVE SARS-CoV-2 target nucleic acids are DETECTED. The SARS-CoV-2 RNA is generally detectable in upper and lower  respiratory specimens dur ing the acute phase  of infection.  Positive  results are indicative of active infection with SARS-CoV-2.  Clinical  correlation with patient history and other diagnostic information is  necessary to determine patient infection status.  Positive results do  not rule out bacterial infection or co-infection with other viruses. If result is PRESUMPTIVE POSTIVE SARS-CoV-2 nucleic acids MAY BE PRESENT.   A presumptive positive result was obtained on the submitted specimen  and confirmed on repeat testing.  While 2019 novel coronavirus  (SARS-CoV-2) nucleic acids may be present in the submitted sample  additional confirmatory testing may be necessary for epidemiological  and / or clinical management purposes  to differentiate between  SARS-CoV-2 and other Sarbecovirus currently known to infect humans.  If clinically indicated additional testing with an alternate test  methodology [phone]) is advised. The SARS-CoV-2 RNA is generally  detectable in upper and lower respiratory sp ecimens during the acute  phase of infection. The expected result is Negative. Fact Sheet for Patients:  StrictlyIdeas.no Fact Sheet for Healthcare Providers: BankingDealers.co.za This test is not yet approved or cleared by the [place] FDA and has been authorized for detection and/or diagnosis of SARS-CoV-2 by FDA under an Emergency Use Authorization (EUA).  This EUA will remain in effect (meaning this test can be used) for the duration of the COVID-19 declaration under Section 564(b)(1) of the Act, 21 U.S.C. section 360bbb-3(b)(1), unless the authorization is terminated or revoked sooner. Performed at [facility], [place] [address] [place], [place] [address]       Studies:  No results found.  Assessment: 58 y.o.   1.  Metastatic breast cancer, ER/PR +, HER-2 negative, pt has declined treatment lately after disease progression  2.  Bone metastases with  hypercalcemia malignancy 3.  Severe anemia (refuses transfusions as she is a Jehovah's Witness), on Aranesp injection every 2 weeks  4.  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  history of right lower extremity DVT, off a/c by pt  6.  Failure to thrive 7.  Transaminitis and hyperbilirubinemia  Plan:  -Patient has deteriorated more lately, she is clearly terminal.  We previously offered cancer treatment, she declined due to concerns of side effects -I reviewed her recent CT scan, which indicated further disease progression.  She is terminal, given the liver failure, I anticipate her life expectancy could be a few weeks -she has agreed to involve hospice.  I had a long conversation with patient and her daughter Brunetta Genera regarding home hospice versus residential hospice.  Brunetta Genera is very concerned that she does not have enough support at home and she has limited home aide service. Pt initially wants to go home, concerned about COVID-19 at [facility], I called [facility] and got her questions answered. She finally agreed with residential hospice, and going to [facility].  -I  called Dr. Rowe Pavy who agrees with residential hospice placement.  I spoke with social worker Suanne Marker, she will contact hospice coordinator Anderson Malta to arrange her discharge to [facility].  -I updated Dr. Doristine Bosworth.  -pt is DNR/DNI   I spent a total of 60 mins on her care coordination, >50% on face-to-face counseling.  [name], MD 09/04/2018  1:32 PM

## 2018-09-05 NOTE — Progress Notes (Signed)
Manufacturing engineer [facility])  [place] will have a bed for Ms. Pribble today.  Necessary consents will need to be completed with her daughter Brunetta Genera.  Left message for [place] and awaiting return call.  Will update TOC manager once this is complete so transfer can be arranged.  Thank you, Venia Carbon RN, BSN, [facility] Liaison (in [place]) [phone]

## 2018-09-05 NOTE — Care Management Important Message (Signed)
Important Message  Patient Details IM Letter given to [name] SW to present to the Patient Name: Michaela Little MRN: [id number] Date of Birth: 05/15/1960   Medicare Important Message Given:  Yes    Kerin Salen 09/05/2018, 10:18 AM

## 2018-09-05 NOTE — Progress Notes (Signed)
Patient transferred to [facility] via [place]. Donne Hazel, RN

## 2018-09-05 NOTE — Progress Notes (Signed)
PMT no charge note  Chart reviewed  Michaela Little input and follow up  Call placed, discussed with Ms Michaela Little, patient's daughter Michaela Little has been contacted, paperwork to be reviewed and anticipate Ms Michaela Little being transferred to [facility] today, for aggressive symptom management with life limiting illness of advanced metastatic breast cancer.   No additional PMT specific recommendations at this time.   Loistine Chance MD [place] palliative medicine team [phone]

## 2018-09-05 NOTE — Progress Notes (Signed)
Manufacturing engineer [facility]) [facility]  Necessary consents were signed with Ms. Wenk's daughter Brunetta Genera.  Dr. Tomasa Hosteller will assume care for Ms. Gammell.  TOC manager updated.  Transportation can be arranged at any time.  RN staff:  Please call report to [phone], you may remove IVs  Please fax d/c summary to [phone]  Thank you, Venia Carbon RN, BSN, CCRN

## 2018-09-05 NOTE — Progress Notes (Signed)
Patient seen and examined.  She has no new complaint other than generalized weakness.  No change from yesterday.  She now has a place/bed at [facility] and she is going to be discharged today.  Please refer to my discharge summary from yesterday for details.

## 2018-09-05 NOTE — Progress Notes (Signed)
[facility] Transport arranged.   [name], Marlinda Mike, MSW Clinical Social Worker  [phone] 09/05/2018  3:11 PM

## 2018-09-05 NOTE — Progress Notes (Signed)
Call made to [facility] to attempt to give report. Was on hold for five minutes. Unable to give report but will fax discharge papers to them. Donne Hazel, RN

## 2018-09-05 NOTE — TOC Transition Note (Signed)
Transition of Care [facility]) - CM/SW Discharge Note   Patient Details  Name: Michaela Little MRN: [id number] Date of Birth: Sep 02, 1960  Transition of Care [facility]) CM/SW Contact:  Lia Hopping, [place] Phone Number: 09/05/2018, 11:05 AM   Clinical Narrative:    CSW following to arrange transportation to hospice facility.  Medical Nes. Form complete.  DNR signed.    Final next level of care: [place] Barriers to Discharge: No Barriers Identified   Patient Goals and CMS Choice Patient states their goals for this hospitalization and ongoing recovery are:: Residential Hospice  CMS Medicare.gov Compare Post Acute Care list provided to:: Patient Choice offered to / list presented to : Patient  Discharge Placement              Patient chooses bed at: [facility])     Patient and family notified of of transfer: 09/05/18  Discharge Plan and Services In-house Referral: Clinical Social Work, Hospice / Palliative Care Discharge Planning Services: CM Consult Post Acute Care Choice: Hospice                               Social Determinants of Health (SDOH) Interventions     Readmission Risk Interventions No flowsheet data found.

## 2018-09-08 ENCOUNTER — Inpatient Hospital Stay: Payer: Medicare Other

## 2018-09-10 ENCOUNTER — Telehealth: Payer: Self-pay | Admitting: Hematology

## 2018-09-10 NOTE — Telephone Encounter (Signed)
Pt called today and asked if she can still get Aranesp injections when she is at [facility]. I called back and explained to her that the injection will unlikely change her comfort level and I do not think they will give it to her since she is on comfort care only. She voiced good understanding. She is thinking she may move to [facility] after her 14-days stay at the [facility]. Support offered, I remains to be available to assist her care.   [name]  09/10/2018

## 2018-09-26 ENCOUNTER — Encounter (HOSPITAL_COMMUNITY): Payer: Self-pay

## 2018-09-26 ENCOUNTER — Emergency Department (HOSPITAL_COMMUNITY)

## 2018-09-26 ENCOUNTER — Other Ambulatory Visit: Payer: Self-pay

## 2018-09-26 ENCOUNTER — Inpatient Hospital Stay (HOSPITAL_COMMUNITY)
Admission: EM | Admit: 2018-09-26 | Discharge: 2018-10-25 | DRG: 871 | Disposition: E | Attending: Pulmonary Disease | Admitting: Pulmonary Disease

## 2018-09-26 DIAGNOSIS — E872 Acidosis: Secondary | ICD-10-CM | POA: Diagnosis present

## 2018-09-26 DIAGNOSIS — E785 Hyperlipidemia, unspecified: Secondary | ICD-10-CM | POA: Diagnosis present

## 2018-09-26 DIAGNOSIS — Z515 Encounter for palliative care: Secondary | ICD-10-CM | POA: Diagnosis present

## 2018-09-26 DIAGNOSIS — I480 Paroxysmal atrial fibrillation: Secondary | ICD-10-CM | POA: Diagnosis present

## 2018-09-26 DIAGNOSIS — R6520 Severe sepsis without septic shock: Secondary | ICD-10-CM | POA: Diagnosis not present

## 2018-09-26 DIAGNOSIS — C50919 Malignant neoplasm of unspecified site of unspecified female breast: Secondary | ICD-10-CM

## 2018-09-26 DIAGNOSIS — E871 Hypo-osmolality and hyponatremia: Secondary | ICD-10-CM | POA: Diagnosis present

## 2018-09-26 DIAGNOSIS — Z87891 Personal history of nicotine dependence: Secondary | ICD-10-CM | POA: Diagnosis not present

## 2018-09-26 DIAGNOSIS — R748 Abnormal levels of other serum enzymes: Secondary | ICD-10-CM | POA: Diagnosis present

## 2018-09-26 DIAGNOSIS — N170 Acute kidney failure with tubular necrosis: Secondary | ICD-10-CM | POA: Diagnosis present

## 2018-09-26 DIAGNOSIS — D649 Anemia, unspecified: Secondary | ICD-10-CM | POA: Diagnosis present

## 2018-09-26 DIAGNOSIS — Z79899 Other long term (current) drug therapy: Secondary | ICD-10-CM | POA: Diagnosis not present

## 2018-09-26 DIAGNOSIS — I959 Hypotension, unspecified: Secondary | ICD-10-CM

## 2018-09-26 DIAGNOSIS — G9340 Encephalopathy, unspecified: Secondary | ICD-10-CM

## 2018-09-26 DIAGNOSIS — E875 Hyperkalemia: Secondary | ICD-10-CM | POA: Diagnosis present

## 2018-09-26 DIAGNOSIS — Z86718 Personal history of other venous thrombosis and embolism: Secondary | ICD-10-CM

## 2018-09-26 DIAGNOSIS — Z86711 Personal history of pulmonary embolism: Secondary | ICD-10-CM

## 2018-09-26 DIAGNOSIS — G4733 Obstructive sleep apnea (adult) (pediatric): Secondary | ICD-10-CM | POA: Diagnosis present

## 2018-09-26 DIAGNOSIS — Z1159 Encounter for screening for other viral diseases: Secondary | ICD-10-CM | POA: Diagnosis not present

## 2018-09-26 DIAGNOSIS — R6521 Severe sepsis with septic shock: Secondary | ICD-10-CM | POA: Diagnosis present

## 2018-09-26 DIAGNOSIS — I10 Essential (primary) hypertension: Secondary | ICD-10-CM | POA: Diagnosis present

## 2018-09-26 DIAGNOSIS — A419 Sepsis, unspecified organism: Principal | ICD-10-CM | POA: Diagnosis present

## 2018-09-26 DIAGNOSIS — D696 Thrombocytopenia, unspecified: Secondary | ICD-10-CM | POA: Diagnosis present

## 2018-09-26 DIAGNOSIS — K219 Gastro-esophageal reflux disease without esophagitis: Secondary | ICD-10-CM | POA: Diagnosis present

## 2018-09-26 DIAGNOSIS — C799 Secondary malignant neoplasm of unspecified site: Secondary | ICD-10-CM | POA: Diagnosis present

## 2018-09-26 DIAGNOSIS — Z66 Do not resuscitate: Secondary | ICD-10-CM | POA: Diagnosis present

## 2018-09-26 DIAGNOSIS — Z79891 Long term (current) use of opiate analgesic: Secondary | ICD-10-CM

## 2018-09-26 DIAGNOSIS — R34 Anuria and oliguria: Secondary | ICD-10-CM

## 2018-09-26 DIAGNOSIS — R4182 Altered mental status, unspecified: Secondary | ICD-10-CM

## 2018-09-26 LAB — CBC WITH DIFFERENTIAL/PLATELET
Abs Immature Granulocytes: 0.1 10*3/uL — ABNORMAL HIGH (ref 0.00–0.07)
Basophils Absolute: 0 10*3/uL (ref 0.0–0.1)
Basophils Relative: 1 %
Eosinophils Absolute: 0 10*3/uL (ref 0.0–0.5)
Eosinophils Relative: 0 %
HCT: 15.3 % — ABNORMAL LOW (ref 36.0–46.0)
Hemoglobin: 4.3 g/dL — CL (ref 12.0–15.0)
Immature Granulocytes: 2 %
Lymphocytes Relative: 41 %
Lymphs Abs: 2.3 10*3/uL (ref 0.7–4.0)
MCH: 28.5 pg (ref 26.0–34.0)
MCHC: 28.1 g/dL — ABNORMAL LOW (ref 30.0–36.0)
MCV: 101.3 fL — ABNORMAL HIGH (ref 80.0–100.0)
Monocytes Absolute: 0.7 10*3/uL (ref 0.1–1.0)
Monocytes Relative: 12 %
Neutro Abs: 2.4 10*3/uL (ref 1.7–7.7)
Neutrophils Relative %: 44 %
Platelets: 11 10*3/uL — CL (ref 150–400)
RBC: 1.51 MIL/uL — ABNORMAL LOW (ref 3.87–5.11)
RDW: 25.9 % — ABNORMAL HIGH (ref 11.5–15.5)
WBC: 5.4 10*3/uL (ref 4.0–10.5)
nRBC: 20.3 % — ABNORMAL HIGH (ref 0.0–0.2)

## 2018-09-26 LAB — COMPREHENSIVE METABOLIC PANEL
ALT: 138 U/L — ABNORMAL HIGH (ref 0–44)
AST: 946 U/L — ABNORMAL HIGH (ref 15–41)
Albumin: 2.3 g/dL — ABNORMAL LOW (ref 3.5–5.0)
Alkaline Phosphatase: 439 U/L — ABNORMAL HIGH (ref 38–126)
Anion gap: 20 — ABNORMAL HIGH (ref 5–15)
BUN: 67 mg/dL — ABNORMAL HIGH (ref 6–20)
CO2: 11 mmol/L — ABNORMAL LOW (ref 22–32)
Calcium: 8.2 mg/dL — ABNORMAL LOW (ref 8.9–10.3)
Chloride: 98 mmol/L (ref 98–111)
Creatinine, Ser: 1.61 mg/dL — ABNORMAL HIGH (ref 0.44–1.00)
GFR calc Af Amer: 40 mL/min — ABNORMAL LOW (ref >60)
GFR calc non Af Amer: 34 mL/min — ABNORMAL LOW (ref >60)
Glucose, Bld: 81 mg/dL (ref 70–99)
Potassium: 6.7 mmol/L (ref 3.5–5.1)
Sodium: 129 mmol/L — ABNORMAL LOW (ref 135–145)
Total Bilirubin: 8.9 mg/dL — ABNORMAL HIGH (ref 0.3–1.2)
Total Protein: 5.6 g/dL — ABNORMAL LOW (ref 6.5–8.1)

## 2018-09-26 LAB — MRSA PCR SCREENING: MRSA by PCR: NEGATIVE

## 2018-09-26 LAB — LACTIC ACID, PLASMA: Lactic Acid, Venous: >11 mmol/L (ref 0.5–1.9)

## 2018-09-26 LAB — SARS CORONAVIRUS 2 BY RT PCR (HOSPITAL ORDER, PERFORMED IN ~~LOC~~ HOSPITAL LAB): SARS Coronavirus 2: NEGATIVE

## 2018-09-26 MED ORDER — SODIUM CHLORIDE 0.9 % IV SOLN
2.0000 g | Freq: Two times a day (BID) | INTRAVENOUS | Status: DC
  Filled 2018-09-26: qty 2

## 2018-09-26 MED ORDER — ALBUTEROL SULFATE HFA 108 (90 BASE) MCG/ACT IN AERS
2.0000 | INHALATION_SPRAY | Freq: Once | RESPIRATORY_TRACT | Status: DC
  Filled 2018-09-26: qty 6.7

## 2018-09-26 MED ORDER — VANCOMYCIN HCL IN DEXTROSE 1-5 GM/200ML-% IV SOLN: 1000.0000 mg | Freq: Once | INTRAVENOUS | Status: DC

## 2018-09-26 MED ORDER — VANCOMYCIN HCL 10 G IV SOLR
1750.0000 mg | Freq: Once | INTRAVENOUS | Status: AC
  Administered 2018-09-26: 1750 mg via INTRAVENOUS
  Filled 2018-09-26: qty 1750

## 2018-09-26 MED ORDER — SODIUM CHLORIDE 0.9 % IV SOLN
2.0000 g | Freq: Once | INTRAVENOUS | Status: AC
  Administered 2018-09-26: 11:00:00 2 g via INTRAVENOUS
  Filled 2018-09-26: qty 2

## 2018-09-26 MED ORDER — ACETAMINOPHEN 650 MG RE SUPP: 650.0000 mg | RECTAL | Status: DC | PRN

## 2018-09-26 MED ORDER — IPRATROPIUM-ALBUTEROL 0.5-2.5 (3) MG/3ML IN SOLN: 3.0000 mL | RESPIRATORY_TRACT | Status: DC | PRN

## 2018-09-26 MED ORDER — GLYCOPYRROLATE 0.2 MG/ML IJ SOLN
0.1000 mg | Freq: Four times a day (QID) | INTRAMUSCULAR | Status: DC | PRN
  Filled 2018-09-26: qty 0.5

## 2018-09-26 MED ORDER — SODIUM CHLORIDE 0.9 % IV BOLUS
1000.0000 mL | Freq: Once | INTRAVENOUS | Status: AC
  Administered 2018-09-26: 1000 mL via INTRAVENOUS

## 2018-09-26 MED ORDER — DEXTROSE 10 % IV SOLN
Freq: Once | INTRAVENOUS | Status: AC
  Administered 2018-09-26: 14:00:00 via INTRAVENOUS
  Filled 2018-09-26: qty 1000

## 2018-09-26 MED ORDER — METRONIDAZOLE IN NACL 5-0.79 MG/ML-% IV SOLN
500.0000 mg | Freq: Once | INTRAVENOUS | Status: AC
  Administered 2018-09-26: 11:00:00 500 mg via INTRAVENOUS
  Filled 2018-09-26: qty 100

## 2018-09-26 MED ORDER — SODIUM BICARBONATE 8.4 % IV SOLN
Freq: Once | INTRAVENOUS | Status: AC
  Administered 2018-09-26: 09:00:00 via INTRAVENOUS
  Filled 2018-09-26: qty 100

## 2018-09-26 MED ORDER — ONDANSETRON HCL 4 MG/2ML IJ SOLN: 4.0000 mg | Freq: Four times a day (QID) | INTRAMUSCULAR | Status: DC | PRN

## 2018-09-26 MED ORDER — LORAZEPAM 2 MG/ML IJ SOLN
1.0000 mg | INTRAMUSCULAR | Status: DC | PRN
  Filled 2018-09-26: qty 1

## 2018-09-26 MED ORDER — FENTANYL CITRATE (PF) 100 MCG/2ML IJ SOLN
25.0000 ug | INTRAMUSCULAR | Status: DC | PRN
  Administered 2018-09-26 (×2): 25 ug via INTRAVENOUS
  Filled 2018-09-26 (×2): qty 2

## 2018-09-26 MED ORDER — VANCOMYCIN HCL 10 G IV SOLR: 1750.0000 mg | INTRAVENOUS | Status: DC

## 2018-09-26 MED ORDER — FENTANYL CITRATE (PF) 100 MCG/2ML IJ SOLN: 25.0000 ug | INTRAMUSCULAR | Status: DC | PRN

## 2018-09-26 NOTE — ED Notes (Signed)
Called Daughter Shakima cell and left voicemail.

## 2018-09-26 NOTE — ED Notes (Signed)
PT refuse in and out cath

## 2018-09-26 NOTE — ED Provider Notes (Signed)
[place] DEPT Provider Note   CSN: [id number] Arrival date & time: 10/03/2018  0998    History   Chief Complaint No chief complaint on file.   HPI Michaela Little is a 58 y.o. female.   The history is provided by the EMS personnel. The history is limited by the condition of the patient (Altered mental status).  She has a history of metastatic breast cancer, hypertension, hyperlipidemia and is brought in by ambulance because she has not urinated for the last 2 days.  She had been on hospice care but was removed from hospice 2 days ago.  Per EMS, family states that patient's mental status is at baseline.  However, she is very slow to answer questions and speech is slurred and I do not feel that she is able to give a reliable history.  EMS noted hypotension with blood pressure 74/30, but also noted that she was edematous so they did not give her any IV fluids.  Past Medical History:  Diagnosis Date   Anxiety    Arthritis    hands, knees, hips   Asthma    Bronchitis    Cancer ([place])    Cervical stenosis (uterine cervix)    Disorder of appendix    Enlarged   Dyspnea on exertion    GERD (gastroesophageal reflux disease)    Headache(784.0)    History of breast cancer    2010--  LEFT  s/p  mastectomy (in [place]) AND CHEMORADIATION--  NO RECURRENCE   History of cervical dysplasia    Hyperlipidemia    Hypertension    Malignant neoplasm of overlapping sites of left breast in female, estrogen receptor positive ([place]) 03/05/2013   OSA (obstructive sleep apnea) moderate osa per study  09/2010   CPAP  , NOT USING ON REGULAR BASIS   Pelvic pain in female    Positive H. pylori test    08-05-2013   Positive TB test    AS TEEN--  [place] W/ MEDS   Refusal of blood transfusions as patient is Jehov[employer]'s Witness    Seasonal allergies    Uterine fibroid    Wears glasses     Patient Active Problem List   Diagnosis Date Noted   Palliative care  by specialist    Failure to thrive in adult 09/02/2018   Elevated LFTs 09/01/2018   Vulvar lesion 08/15/2018   Dysplasia of cervix 07/15/2018   Non-toxic nodular goiter 07/15/2018   Osteoarthrosis involving lower leg 07/15/2018   Tear of medial meniscus of knee joint 07/15/2018   Thrombocytopenia, unspecified ([place]) 07/01/2018   Palliative care encounter    Atrial fibrillation with RVR ([place]) 05/30/2018   New onset a-fib ([place]) 05/30/2018   Malnutrition of moderate degree 05/04/2018   Hypercalcemia 04/18/2018   Metastatic breast cancer ([place])    Pulmonary embolism ([place]) 02/27/2018   Acute deep vein thrombosis (DVT) of proximal vein of right lower extremity (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   Patient is Jehovah's Witness    Refusal of blood transfusions as patient is Jehovah's Witness    High risk medication use 09/25/2017   Lower extremity edema 09/25/2017   S/P laparoscopic appendectomy 08/22/2017   Goals of care, counseling/discussion 01/20/2017   Multinodular thyroid 02/08/2016   Lumbar back pain with radiculopathy affecting left lower extremity 02/08/2016   Carcinoma of breast metastatic to bone ([place]) 02/08/2016   Bone metastases ([place]) 11/13/2015   Pain, cancer 06/04/2015   Glaucoma suspect of  right eye 04/18/2015   Allergic rhinitis due to pollen 06/30/2014   History of left mastectomy 01/15/2014   History of uterine fibroid 01/15/2014   Advance directive on file 01/14/2014   Essential hypertension 12/24/2013   Left ventricular diastolic dysfunction with preserved systolic function 94/85/4627   Amblyopia, both eyes 07/29/2013   Cataract 07/29/2013   High myopia 07/29/2013   Amblyopia 07/29/2013   Morbid (severe) obesity due to excess calories ([place]) 06/23/2013   GERD (gastroesophageal reflux disease) 03/25/2013   Constipation 03/25/2013   Other and unspecified hyperlipidemia 03/25/2013   Malignant  neoplasm of overlapping sites of left breast in female, estrogen receptor positive ([place]) 03/05/2013   Hyperlipidemia 11/13/2011   Dysplasia of cervix, low grade (CIN 1) 03/50/0938   Diastolic dysfunction 18/29/9371   Obesity 03/04/2011   OSA (obstructive sleep apnea) 02/27/2011   Asthma 02/08/2011    Past Surgical History:  Procedure Laterality Date   APPENDECTOMY  y   CARDIOVASCULAR STRESS TEST  10-29-2012   low risk perfusion study/  no significant reversibity/ ef 66%/  normal wall motion   CERVICAL CONIZATION W/BX  2012   in  [place] 05/14/2012   Procedure: LAPAROSCOPIC CHOLECYSTECTOMY;  Surgeon: Ralene Ok, MD;  Location: [facility];  Service: General;  Laterality: N/A;   COLONOSCOPY  2011   normal per patient - [place]  10/15/2011   Procedure: DILATATION AND CURETTAGE;  Surgeon: Melina Schools, MD;  Location: [facility];  Service: Gynecology;  Laterality: N/A;  Conization &  endocervical curettings   EXAMINATION UNDER ANESTHESIA N/A 08/20/2013   Procedure: EXAM UNDER ANESTHESIA;  Surgeon: Margarette Asal, MD;  Location: [facility];  Service: Gynecology;  Laterality: N/A;   EYE SURGERY  1961   right   IR IVC FILTER PLMT / S&I /IMG GUID/MOD SED  03/01/2018   IR RADIOLOGIST EVAL & MGMT  04/08/2018   LAPAROSCOPIC APPENDECTOMY N/A 08/22/2017   Procedure: APPENDECTOMY LAPAROSCOPIC ERAS PATHWAY;  Surgeon: Coralie Keens, MD;  Location: [facility];  Service: General;  Laterality: N/A;   LAPAROSCOPIC ASSISTED VAGINAL HYSTERECTOMY N/A 10/26/2014   Procedure: HYSTERECTOMY ABDOMINAL ;  Surgeon: Molli Posey, MD;  Location: [facility];  Service: Gynecology;  Laterality: N/A;   MASTECTOMY Left 11/2008  in [place]    SALPINGOOPHORECTOMY Bilateral 10/26/2014   Procedure: BILATERAL SALPINGO OOPHORECTOMY;  Surgeon: Molli Posey, MD;  Location: [facility];  Service: Gynecology;  Laterality: Bilateral;   TISSUE  EXPANDER PLACEMENT Left 08/22/2017   Procedure: REMOVAL OF LEFT TISSUE EXPANDER;  Surgeon: Crissie Reese, MD;  Location: [facility];  Service: Plastics;  Laterality: Left;   TISSUE EXPANDER REMOVAL Left 08/22/2017   TRANSTHORACIC ECHOCARDIOGRAM  10-29-2012   mild lvh/  ef 60-65%/  grade II diastolic dysfunction/  trivial mr  &  tr     OB History    Gravida  10   Para  5   Term  5   Preterm      AB  5   Living  5     SAB  3   TAB  2   Ectopic      Multiple      Live Births               Home Medications    Prior to Admission medications   Medication Sig Start Date End Date Taking? Authorizing Provider  acetaminophen (TYLENOL) 500 MG  tablet Take 500 mg by mouth every 6 (six) hours as needed for mild pain.    [provider]  carvedilol (COREG) 12.5 MG tablet Take 1 tablet (12.5 mg total) by mouth 2 (two) times daily with a meal. Patient not taking: Reported on 09/01/2018 08/14/18   Ladell Pier, MD  carvedilol (COREG) 6.25 MG tablet Take 6.25 mg by mouth 2 (two) times daily with a meal.  08/13/18   [provider]  cetirizine (ZYRTEC) 10 MG tablet Take 10 mg by mouth daily as needed for allergies or rhinitis.  12/19/17   [provider]  docusate sodium (COLACE) 100 MG capsule Take 1 capsule (100 mg total) by mouth daily as needed for mild constipation. Patient taking differently: Take 200 mg by mouth every other day.  04/28/18   Hosie Poisson, MD  doxycycline (VIBRAMYCIN) 100 MG capsule Take 1 capsule (100 mg total) by mouth 2 (two) times daily. 08/15/18   Anyanwu, Sallyanne Havers, MD  feeding supplement, ENSURE ENLIVE, (ENSURE ENLIVE) LIQD Take 237 mLs by mouth 2 (two) times daily between meals. Patient taking differently: Take 237 mLs by mouth See admin instructions. Drink 237 ml's by mouth two to three times a day (Non-dairy Ensure) 04/28/18   Hosie Poisson, MD  hydrALAZINE (APRESOLINE) 10 MG tablet Take 1 tablet (10 mg total) by mouth 3 (three) times  daily. Patient not taking: Reported on 09/01/2018 04/22/18   Kayleen Memos, DO  ipratropium-albuterol (DUONEB) 0.5-2.5 (3) MG/3ML SOLN INHALE THREE MLS VIA NEBULIZER EVERY 6 HOURS AS NEEDED Patient taking differently: Inhale 3 mLs into the lungs every 6 (six) hours as needed (sob and wheezing).  02/13/18   Clent Demark, PA-C  ondansetron [facility]) 8 MG tablet Take 8 mg by mouth every 8 (eight) hours as needed for nausea or vomiting.  08/19/18   [provider]  traMADol (ULTRAM) 50 MG tablet Take 2 tablets (100 mg total) by mouth every 6 (six) hours. Patient taking differently: Take 50 mg by mouth every 6 (six) hours as needed for moderate pain or severe pain.  03/08/18   Cristal Ford, DO    Family History Family History  Problem Relation Age of Onset   Breast cancer Mother    Colon cancer Mother    Hypotension Mother    Asthma Mother    Diabetes type II Mother    Arthritis Mother    Clotting disorder Mother    Cancer Mother        breast/colon   Mental illness Brother    Heart disease Brother    Cerebral palsy Daughter    Emphysema Brother        never smoker   Colon cancer Maternal Aunt    Cancer Maternal Aunt        colon   Colon cancer Maternal Uncle    Cancer Maternal Uncle        colon   Esophageal cancer Neg Hx    Rectal cancer Neg Hx    Stomach cancer Neg Hx    Thyroid disease Neg Hx     Social History Social History   Tobacco Use   Smoking status: Former Smoker    Packs/day: 0.30    Years: 10.00    Pack years: 3.00    Types: Cigarettes    Quit date: 03/26/1988    Years since quitting: 30.5   Smokeless tobacco: Never Used  Substance Use Topics   Alcohol use: No   Drug use:  No     Allergies   Lisinopril-hydrochlorothiazide, Adhesive [tape], Fentanyl, Gabapentin, Hctz [hydrochlorothiazide], Latex, Lisinopril, Losartan potassium, Other, Oxycodone, and Tums [calcium carbonate antacid]   Review of Systems Review of  Systems  Unable to perform ROS: Mental status change     Physical Exam Updated Vital Signs BP (!) 83/55 (BP Location: Right Arm)    Pulse 73    Resp 20    LMP 02/25/2009    SpO2 100%   Physical Exam Vitals signs and nursing note reviewed.    58 year old female, resting comfortably and in no acute distress. Vital signs are significant for low blood pressure. Oxygen saturation is 100%, which is normal. Head is normocephalic and atraumatic. PERRLA, EOMI. Oropharynx is clear. Neck is nontender and supple without adenopathy or JVD. Back is nontender and there is no CVA tenderness. Lungs are clear without rales, wheezes, or rhonchi. Chest is nontender. Heart has regular rate and rhythm without murmur. Abdomen is soft, flat, with marked hepatomegaly and tender liver margin.  No evidence of bladder distention. Extremities have 1+ edema, full range of motion is present. Skin is warm and dry without rash. Neurologic: Awake and verbal but slow to answer questions and speech is somewhat slurred, cranial nerves are intact, there are no gross motor or sensory deficits.  ED Treatments / Results  Labs (all labs ordered are listed, but only abnormal results are displayed) Labs Reviewed  COMPREHENSIVE METABOLIC PANEL  CBC WITH DIFFERENTIAL/PLATELET  URINALYSIS, ROUTINE W REFLEX MICROSCOPIC  LACTIC ACID, PLASMA  LACTIC ACID, PLASMA    EKG None  Radiology No results found.  Procedures Procedures  CRITICAL CARE Performed by: Delora Fuel Total critical care time: 40 minutes Critical care time was exclusive of separately billable procedures and treating other patients. Critical care was necessary to treat or prevent imminent or life-threatening deterioration. Critical care was time spent personally by me on the following activities: development of treatment plan with patient and/or surrogate as well as nursing, discussions with consultants, evaluation of patient's response to treatment,  examination of patient, obtaining history from patient or surrogate, ordering and performing treatments and interventions, ordering and review of laboratory studies, ordering and review of radiographic studies, pulse oximetry and re-evaluation of patient's condition.  Medications Ordered in ED Medications  sodium chloride 0.9 % bolus 1,000 mL (has no administration in time range)     Initial Impression / Assessment and Plan / ED Course  I have reviewed the triage vital signs and the nursing notes.  Pertinent labs & imaging results that were available during my care of the patient were reviewed by me and considered in my medical decision making (see chart for details).  Hypotension likely secondary to dehydration.  And urea which I suspect is related to poor volume status, possible acute kidney injury.  Metastatic breast cancer.  Also, with known metastatic cancer, need to be concerned with possible hypercalcemia.  She will be given IV hydration, screening labs are obtained.  Old records are reviewed, and she is Jehovah's Witness and does not want any blood products administered.  Calcium on September 02, 2018 was 11.1 making severe calcium he had distinct possibility.  Also, per EMS, family states that patient should be DNR even though paperwork has not been filed for that.  Case is signed out to Dr. Sherry Ruffing.  Final Clinical Impressions(s) / ED Diagnoses   Final diagnoses:  Anuria  Hypotension, unspecified hypotension type  Altered mental status, unspecified altered mental status type  Metastatic breast cancer [facility])    ED Discharge Orders    None       Delora Fuel, MD 50/09/38 [phone]

## 2018-09-26 NOTE — ED Notes (Addendum)
Got second IV access but unable to get blood for second set of blood cultures.

## 2018-09-26 NOTE — Progress Notes (Signed)
Pt arrived to unit. Pt has shallow labored breathing. Pt appears to be resting with eyes closed. Daughters at bedside, comfort cart ordered for family.

## 2018-09-26 NOTE — ED Notes (Signed)
X-ray at bedside

## 2018-09-26 NOTE — ED Triage Notes (Signed)
Pt stage 4 cancer patient complaining of urinary retention x 2 days. Taken off Hospice 2 days ago and went back to family. Pt is edematous. Slurred speech at base line. Mentating per normal per family. 74/30 BP. She is [employer] and refuses all blood products.

## 2018-09-26 NOTE — Progress Notes (Signed)
Pharmacy Antibiotic Note  Michaela Little is a 58 y.o. female admitted on 10/19/2018 with sepsis.  Pharmacy has been consulted for cefepime and vancomycin dosing.  Plan: Vancomycin 1750 mg IV Q 48 hrs. Goal AUC 400-550. Expected AUC: 497.4    SCr used: 2 (acutal SCr 1.61 but pt anuric)  Used Wt 85.6 kg from 09/02/18 in EPIC Cefepime 2 gm IV q12 F/u/ renal fxn, WBC, temp, culture data Vancomycin levels as needed   Temp (24hrs), Avg:95.8 F (35.4 C), Min:95.8 F (35.4 C), Max:95.8 F (35.4 C)  Recent Labs  Lab 10/15/2018 0710  WBC 5.4  CREATININE 1.61*  LATICACIDVEN >11.0*    CrCl cannot be calculated (Unknown ideal weight.).    Allergies  Allergen Reactions  . Lisinopril-Hydrochlorothiazide Itching  . Adhesive [Tape] Itching and Other (See Comments)    Redness  . Fentanyl Other (See Comments)    GI upset and drowsiness. Only to [facility]  . Gabapentin Other (See Comments)    Auditory hallucinations  . Hctz [Hydrochlorothiazide] Itching  . Latex Itching  . Lisinopril Itching  . Losartan Potassium Other (See Comments)    Makes her feel "bad"   . Other Other (See Comments)    Patient refuses blood for religious reasons (per her notes)  . Oxycodone     GI upset, drowsy  . Tums [Calcium Carbonate Antacid] Hives    FRUIT-FLAVORED ONES   Antimicrobials this admission:  7/3 vanc>> 7/3 cefepime>> 7/3 flagyl x 1 dose Dose adjustments this admission:   Microbiology results:  7/3 BCx1 (unable to get 2nd set): sent 7/3 SARS CoV 2: sent  Thank you for allowing pharmacy to be a part of this patient's care.  Eudelia Bunch, Pharm.D 09/24/2018 10:55 AM

## 2018-09-26 NOTE — ED Notes (Signed)
Date and time results received: 09/25/2018 8:03 AM  (use smartphrase ".now" to insert current time)  Test: hgb 4.3, PLT 11, Lactic acid >11, potassium 6.7 Critical Value:   Name of Provider Notified: Dr Sherry Ruffing  Orders Received? Or Actions Taken?:

## 2018-09-26 NOTE — ED Notes (Signed)
Bed: WA24 Expected date:  Expected time:  Means of arrival:  Comments: 

## 2018-09-26 NOTE — Progress Notes (Signed)
Met with patient's 3 daughters in patients room.    They were in process of getting her to [place] to be closer to family when they needed to make funeral arrangements.  She has been slowly deteriorating over the past few weeks, but especially that last few days.  They understand that there isn't any additional therapy to offer for her breast cancer.  I explained the sequence of events through the course of today.  They are in agreement that focus of care should be related to comfort measures.  Confirmed DNR/DNI status.  Will stop all therapies related to comfort.    Will arrange for transfer to med/surg floor room.  I anticipate she will pass sometime in the near future.  Updated hospice team about plan.  Vineet Sood, MD [place] [facility] 10/23/2018, 2:01 PM   

## 2018-09-26 NOTE — Progress Notes (Signed)
Oncology follow up   I noticed that pt was admitted to [facility] today and stopped by at her room, met and talked to her three daughters. Pt is lethargic, no able to communicate or follow commands. She remains to be hypotension. I have reviewed her labs.   Discussed with her daughters that she is actively dying, anticipate she will pass in the next few days. She is DNR. I asked them if she wants to stay at [facility] or go to [facility] again. They have not decided.  Emotional support given, and I will remain available if anything I can help. Her daughters expressed their appreciation to my visit.   [name]  10/21/2018

## 2018-09-26 NOTE — ED Notes (Signed)
Per Dr Sherry Ruffing, all 3 daughters can be at bedside. Leanora Ivanoff, daughter back and made her aware of the change from 1 visitor to 3 daughters.

## 2018-09-26 NOTE — Progress Notes (Addendum)
[facility])  Mrs. Mccaul is our active hospice pt.  She was at [facility] for EOL needs, she wanted to transfer to her daughter's home (two days ago) to continue with hospice at her daughter's home.  She does have and OOF DNR.  Should she be admitted, [place] will continue to follow to help with discharge needs.  Once she is ready to discharge home, please use [facility] for transport as they provide this service for active [facility] hospice pts.  Thank you, Venia Carbon RN, BSN, [facility] Liaison (in [place]) [phone]

## 2018-09-26 NOTE — ED Notes (Signed)
Gave cola and cup of ice to the three daughters. Also made them aware that visitors aren't allowed upstairs (admissions) unless patient has less than 6 hours to live. All daughters verbalized understanding. THis RN made them aware that they can stay with patient until she goes upstairs to her available room.

## 2018-09-26 NOTE — Progress Notes (Signed)
Palliative Medicine RN Note: Consult order noted. This is a covered, related admission with [place].  I spoke with the [place]. She has been in contact with Ms Wisher's family and is very familiar with them. Anderson Malta is aware of this admission, as well as the patient's [place]. She reports that the family will likely NOT be receptive to PMT involvement, and that given the family dynamics, further discussion by our team may worsen the situation. As this is a hospice admission, the hospice team will have ongoing [place] conversations with the patient's family and her hospice care team.  At this time, PMT will not see Ms Castronova at [facility] request. The [facility] staff will call us if this changes.  Marjie Skiff Lashawna Poche, RN, BSN, [facility] Palliative Medicine Team 10/21/2018 10:47 AM Office [phone]

## 2018-09-26 NOTE — H&P (Signed)
NAME:  Michaela Little, MRN:  [id number], DOB:  [birth year], LOS: 0 ADMISSION DATE:  10/05/2018, CONSULTATION DATE:  09/26/2018 REFERRING MD:  Dr. Sherry Ruffing, ER, CHIEF COMPLAINT: Altered mental status  Brief History   58 yo female with metastatic breast cancer enrolled in hospice presented with altered mental status, respiratory distress, anemia, thrombocytopenia, AKI, hyperkalemia, metabolic acidosis, elevated LFTs.  She is Jehovah's witness.  History of present illness   58 yo female presented to ER with altered mental status, respiratory distress, anemia, thrombocytopenia, AKI, hyperkalemia, metabolic acidosis, elevated LFTs.  She is Jehovah's witness.  She is followed by oncology.  Treatment goal has been palliation and felt to be terminal stage of her disease with life expectancy of few weeks.  Was admitted on 09/01/18.  Seen by palliative care.  Decision made for home hospice care and she was DNR.  Family was looking into admission at [facility].  She completed 14 day stay at [facility], and then was considering moving to [place] to be closer to her daughters.  History from chart and ER staff.  Patient not able to provide accurate history due to altered mental status.  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 rate (!) 33, last menstrual period 02/25/2009, SpO2 100 %.        Intake/Output Summary (Last 24 hours) at 10/19/2018 1025 Last data filed at 10/24/2018 0845 Gross per  24 hour  Intake 1000 ml  Output -  Net 1000 ml   There were no vitals filed for this visit.  Examination:  General - ill appearing, increased WOB Eyes - pupils reactive ENT - no sinus tenderness, no stridor Cardiac - regular rate/rhythm, no murmur Chest - decreased BS, s/p Lt mastectomy Abdomen - soft, decreased bowel sounds Extremities - 1+ edema Skin - extremities cool Neuro - mumbles few words, moves extremities, intermittently follows commands  CXR (reviewed by me) - diffuse bony mets, low lung volumes   Discussion:  She has hypotension, lactic acidosis, hyperkalemia, hyponatremia, AKI, elevated LFTs, worsening anemia, thrombocytopenia.  She has terminal breast cancer.  She is Jehovah's witnessed.  She is DNR/DNI and enrolled in hospice.  Plan:  Had discussion with pt's son over the phone.  Her daughters will be coming to hospital today.  Will continue IV fluids and antibiotics.  Confirmed DNR/DNI.  Explained to patient son that she is likely at the end of her life, and more aggressive therapies would not be of benefit.  Will continue to focus on comfort for patient also.  Palliative care consulted to assist with end of life care.   Labs   CBC: Recent Labs  Lab 09/30/2018 0710  WBC 5.4  NEUTROABS 2.4  HGB 4.3*  HCT 15.3*  MCV 101.3*  PLT 11*    Basic Metabolic Panel: Recent Labs  Lab 10/01/2018 0710  NA 129*  K 6.7*  CL 98  CO2 11*  GLUCOSE 81  BUN 67*  CREATININE 1.61*  CALCIUM 8.2*   GFR: CrCl cannot be calculated (Unknown ideal weight.). Recent Labs  Lab 09/30/2018 0710  WBC 5.4  LATICACIDVEN >11.0*    Liver Function Tests: Recent Labs  Lab 10/20/2018 0710  AST 946*  ALT 138*  ALKPHOS 439*  BILITOT 8.9*  PROT 5.6*  ALBUMIN 2.3*   No results for input(s): LIPASE, AMYLASE in the last 168 hours. No results for input(s): AMMONIA in the last 168 hours.  ABG    Component Value Date/Time   TCO2 27 11/19/2013 1302   O2SAT 68.5 12/03/2015 0110      Coagulation Profile: No results for input(s): INR, PROTIME in the last 168 hours.  Cardiac Enzymes: No results for input(s): CKTOTAL, CKMB, CKMBINDEX, TROPONINI in the last 168 hours.  HbA1C: Hgb A1c MFr Bld  Date/Time Value Ref Range Status  09/01/2018 02:47 PM 4.7 (L) 4.8 - 5.6 % Final    Comment:    (NOTE) Pre diabetes:          5.7%-6.4% Diabetes:              >6.4% Glycemic control for   <7.0% adults with diabetes   08/04/2018 09:37 AM 4.9 4.8 - 5.6 % Final    Comment:    (NOTE) Pre diabetes:          5.7%-6.4% Diabetes:              >6.4% Glycemic control for   <7.0% adults with diabetes     CBG: No results for input(s): GLUCAP in the last 168 hours.  Review of Systems:   Unable to obtain  Past Medical History  She,  has a past medical history of Anxiety, Arthritis, Asthma, Bronchitis, Cancer ([place]), Cervical stenosis (uterine cervix), Disorder of appendix, Dyspnea on exertion, GERD (gastroesophageal reflux disease), Headache(784.0), History of breast cancer, History of cervical dysplasia, Hyperlipidemia, Hypertension, Malignant neoplasm of overlapping sites of left breast in female, estrogen receptor positive ([employer]) (03/05/2013), OSA (obstructive sleep apnea) (moderate osa per study  09/2010), Pelvic pain in female, Positive H. pylori test, Positive TB test, Refusal of blood transfusions as patient is Jehovah's Witness, Seasonal allergies, Uterine fibroid, and Wears glasses.   Surgical History    Past Surgical History:  Procedure Laterality Date  . APPENDECTOMY  y  . CARDIOVASCULAR STRESS TEST  10-29-2012   low risk perfusion study/  no significant reversibity/ ef 66%/  normal wall motion  . CERVICAL CONIZATION W/BX  2012   in  [place] N/A 05/14/2012   Procedure: LAPAROSCOPIC CHOLECYSTECTOMY;  Surgeon: Ralene Ok, MD;  Location: [facility];  Service: General;  Laterality: N/A;  . COLONOSCOPY  2011   normal per patient - [place]  . DILATION AND  CURETTAGE OF UTERUS  10/15/2011   Procedure: DILATATION AND CURETTAGE;  Surgeon: Melina Schools, MD;  Location: [facility];  Service: Gynecology;  Laterality: N/A;  Conization &  endocervical curettings  . EXAMINATION UNDER ANESTHESIA N/A 08/20/2013   Procedure: EXAM UNDER ANESTHESIA;  Surgeon: Margarette Asal, MD;  Location: [facility];  Service: Gynecology;  Laterality: N/A;  . [place]   right  . IR IVC FILTER PLMT / S&I /IMG GUID/MOD SED  03/01/2018  . IR RADIOLOGIST EVAL & MGMT  04/08/2018  . LAPAROSCOPIC APPENDECTOMY N/A 08/22/2017   Procedure: [place];  Surgeon: Coralie Keens, MD;  Location: [facility];  Service: General;  Laterality: N/A;  . LAPAROSCOPIC ASSISTED VAGINAL HYSTERECTOMY N/A 10/26/2014  Procedure: HYSTERECTOMY ABDOMINAL ;  Surgeon: Molli Posey, MD;  Location: [facility];  Service: Gynecology;  Laterality: N/A;  . MASTECTOMY Left 11/2008  in [place]   . SALPINGOOPHORECTOMY Bilateral 10/26/2014   Procedure: BILATERAL SALPINGO OOPHORECTOMY;  Surgeon: Molli Posey, MD;  Location: [facility];  Service: Gynecology;  Laterality: Bilateral;  . TISSUE EXPANDER PLACEMENT Left 08/22/2017   Procedure: REMOVAL OF LEFT TISSUE EXPANDER;  Surgeon: Crissie Reese, MD;  Location: [facility];  Service: Plastics;  Laterality: Left;  . TISSUE EXPANDER REMOVAL Left 08/22/2017  . TRANSTHORACIC ECHOCARDIOGRAM  10-29-2012   mild lvh/  ef 60-65%/  grade II diastolic dysfunction/  trivial mr  &  tr     Social History   reports that she quit smoking about 30 years ago. Her smoking use included cigarettes. She has a 3.00 pack-year smoking history. She has never used smokeless tobacco. She reports that she does not drink alcohol or use drugs.   Family History   Her family history includes Arthritis in her mother; Asthma in her mother; Breast cancer in her mother; Cancer in her maternal aunt, maternal uncle, and mother; Cerebral palsy in her  daughter; Clotting disorder in her mother; Colon cancer in her maternal aunt, maternal uncle, and mother; Diabetes type II in her mother; Emphysema in her brother; Heart disease in her brother; Hypotension in her mother; Mental illness in her brother. There is no history of Esophageal cancer, Rectal cancer, Stomach cancer, or Thyroid disease.   Allergies Allergies  Allergen Reactions  . Lisinopril-Hydrochlorothiazide Itching  . Adhesive [Tape] Itching and Other (See Comments)    Redness  . Fentanyl Other (See Comments)    GI upset and drowsiness. Only to [facility]  . Gabapentin Other (See Comments)    Auditory hallucinations  . Hctz [Hydrochlorothiazide] Itching  . Latex Itching  . Lisinopril Itching  . Losartan Potassium Other (See Comments)    Makes her feel "bad"   . Other Other (See Comments)    Patient refuses blood for religious reasons (per her notes)  . Oxycodone     GI upset, drowsy  . Tums [Calcium Carbonate Antacid] Hives    FRUIT-FLAVORED ONES     Home Medications  Prior to Admission medications   Medication Sig Start Date End Date Taking? Authorizing Provider  acetaminophen (TYLENOL) 500 MG tablet Take 500 mg by mouth every 6 (six) hours as needed for mild pain.    [provider]  carvedilol (COREG) 6.25 MG tablet Take 6.25 mg by mouth 2 (two) times daily with a meal.  08/13/18   [provider]  cetirizine (ZYRTEC) 10 MG tablet Take 10 mg by mouth daily as needed for allergies or rhinitis.  12/19/17   [provider]  docusate sodium (COLACE) 100 MG capsule Take 1 capsule (100 mg total) by mouth daily as needed for mild constipation. Patient taking differently: Take 200 mg by mouth every other day.  04/28/18   Hosie Poisson, MD  feeding supplement, ENSURE ENLIVE, (ENSURE ENLIVE) LIQD Take 237 mLs by mouth 2 (two) times daily between meals. Patient taking differently: Take 237 mLs by mouth See admin instructions. Drink 237 ml's by mouth two  to three times a day (Non-dairy Ensure) 04/28/18   Hosie Poisson, MD  hydrALAZINE (APRESOLINE) 10 MG tablet Take 1 tablet (10 mg total) by mouth 3 (three) times daily. Patient not taking: Reported on 09/01/2018 04/22/18   Kayleen Memos, DO  ipratropium-albuterol (DUONEB)  0.5-2.5 (3) MG/3ML SOLN INHALE THREE MLS VIA NEBULIZER EVERY 6 HOURS AS NEEDED Patient taking differently: Inhale 3 mLs into the lungs every 6 (six) hours as needed (sob and wheezing).  02/13/18   Clent Demark, PA-C  ondansetron [facility]) 8 MG tablet Take 8 mg by mouth every 8 (eight) hours as needed for nausea or vomiting.  08/19/18   [provider]  traMADol (ULTRAM) 50 MG tablet Take 2 tablets (100 mg total) by mouth every 6 (six) hours. Patient taking differently: Take 50 mg by mouth every 6 (six) hours as needed for moderate pain or severe pain.  03/08/18   Cristal Ford, DO     [name], MD [facility] Pulmonary/Critical Care 10/04/2018, 10:45 AM

## 2018-09-26 NOTE — ED Notes (Signed)
Daughters are at bedside. Made Dr Tegeler aware. EDP at bedside updating family.

## 2018-09-26 NOTE — Progress Notes (Addendum)
[facility]) Hospice GIP Admission  This is a related hospital admission with a hospice diagnosis of breast cancer per Dr. Karie Georges.  Pt has been under hospice services since 6/12, where she initially went to [facility].  Pt had recently left residential hospice to go home.  EMS was activated this morning due to increased lethargy and urine retention.  She is admitted with sepsis.  She is disoriented, unable to voice pain, skin is cool to the touch.  V/S: 95.8 ax, 86/43, HR 79, RR 33, SPO2 100% I&O:  2200/-  Abnormal labs:  Na 129, K 6.7, CO2 11, BUN 67, Cr 1.6, Alkaline phosphatase 439, albumin 2.3, AST 946, ALT 138, lactic acid 11, RBC 1.51, HGB 4.3, HCT 15.3, platelets 11 Continuous infusions/IV meds:  maxipime 2g IV BID, D10 @ 50, flagyl 500 mg IV x 1, sodium bicarb 100 mEq IV x 1, NS bolus 1L, vancocin 1750 mg IV x 1, fentanyl 25 mcg IV x 2--all have since been discontinued and pt is now full comfort.  Problem List Anuria- increased Cr from baseline, bolus given Hypotension- LA 11, septic at EOL, fluids and abx administered  IDT:  Discussion with nursing staff and CC MD, daughters decided to move towards full comfort care.  MD to move pt to med-surg floor.  Updated [place] team.  Family:  With their mother, messages left to call if needed.  Disposition:  Hospital death  Venia Carbon RN, BSN, [facility] Liaison (in [place]) [phone]

## 2018-09-26 NOTE — Progress Notes (Signed)
A consult was received from an ED physician for vancomycin and cefepime per pharmacy dosing.  The patient's profile has been reviewed for ht/wt/allergies/indication/available labs.   A one time order has been placed for vanc 1750 mg and cefepime 2 gm and flagyl 500 mg IV (per MD).    Further antibiotics/pharmacy consults should be ordered by admitting physician if indicated.                       Thank you, Eudelia Bunch, Pharm.D 09/28/2018 8:18 AM

## 2018-09-26 NOTE — ED Notes (Signed)
Pt placed on monitor and in gown. Handoff given to [place]

## 2018-09-26 NOTE — Progress Notes (Signed)
Attempted to call daughters to ask them to come be with patient.  Concerned she is declining and death may be imminent.  Message left for return call.   Noe Gens, NP-C [place] Pulmonary & Critical Care Pgr: [phone] or if no answer [phone] 10/10/2018, 12:58 PM

## 2018-09-26 NOTE — ED Provider Notes (Signed)
7:33 AM Care assumed from Dr. Roxanne Mins.  At time of transfer care, patient is awaiting results of diagnostic laboratory testing and work-up.  Patient was found to have decrease in urine output for the last few days and there is concern for new anuria.  Patient has metastatic breast cancer and was found to have blood pressure in the 70s today.  Patient started resuscitation with 1 L fluids.  Patient was reportedly taken off of hospice 2 days ago.  She is Jehovah's Witness and would refuse blood products.  When work-up is been completed, patient will need admission.   Patient's diagnostic laboratory testing began to return and revealed some concerning findings.  Patient's potassium was 6.7 up from prior.  She will be given hyperkalemia medications.  Patient was also not have increase in her creatinine up to 1.6 from prior.  Calcium had improved from over 11-8.2.  AST ALT and alk phos are elevated.  Patient's most concerning finding was her hemoglobin is now down to 4.3 from prior.  As patient is a Sales promotion account executive Witness, she will be unable to get blood products and patient confirms.  Platelets are also very low at 11.  No leukocytosis.  Patient's lactic acid is greater than 11.  Given the patient's hypotension, urinary symptoms, and the lactic acidosis, she will be made a code sepsis and given broad-spectrum antibiotics.  Chest x-ray shows in her known metastasis but no pneumonia or active cardiopulmonary disease.  With these findings, critical care was called initially.  They agreed with the resuscitation with antibiotics and fluids however after was determined that patient is DNR/DNI found in the documents in the chart and nursing confirmed speaking to family, critical care felt it was appropriate that the patient goes to a hospitalist service as she is not on pressors currently or intubated.  Hospitalist team requested reconsultation with ICU team as the family has not yet made her full comfort care thus she  will likely need pressors and further management of her septic shock.    8:50 AM Critical care will be recalled.  Critical care will admit for further management.  Family came to the bedside and was informed of all findings and work-up decision.    CRITICAL CARE Performed by: Gwenyth Allegra Aaban Griep Total critical care time: 45 minutes Critical care time was exclusive of separately billable procedures and treating other patients. Critical care was necessary to treat or prevent imminent or life-threatening deterioration. Critical care was time spent personally by me on the following activities: development of treatment plan with patient and/or surrogate as well as nursing, discussions with consultants, evaluation of patient's response to treatment, examination of patient, obtaining history from patient or surrogate, ordering and performing treatments and interventions, ordering and review of laboratory studies, ordering and review of radiographic studies, pulse oximetry and re-evaluation of patient's condition.      Clinical Impression: 1. Anuria   2. Hypotension, unspecified hypotension type   3. Altered mental status, unspecified altered mental status type   4. Metastatic breast cancer [facility])     Disposition: Admit  This note was prepared with assistance of Dragon voice recognition software. Occasional wrong-word or sound-a-like substitutions may have occurred due to the inherent limitations of voice recognition software.       Venson Ferencz, Gwenyth Allegra, MD 10/23/2018 [phone]

## 2018-09-30 ENCOUNTER — Telehealth: Payer: Self-pay

## 2018-09-30 NOTE — Telephone Encounter (Signed)
Received dc from [facility].  DC is for burial and a patient of Doctor Sood.   DC will be taken to [facility] [address] 2[address] for signature.  On 10/02/2018 Received dc back from Doctor Halford Chessman, I called the funeral home to let them know the dc is ready for pickup.

## 2018-10-01 LAB — CULTURE, BLOOD (ROUTINE X 2)
Culture: NO GROWTH
Special Requests: ADEQUATE

## 2018-10-25 NOTE — Death Summary Note (Signed)
Michaela Little was a 58 y.o. female presented to ER with altered mental status, respiratory distress, anemia, thrombocytopenia, AKI, hyperkalemia, metabolic acidosis, elevated LFTs.  She is Jehovah's witness.  She is followed by oncology.  Treatment goal has been palliation and felt to be terminal stage of her disease with life expectancy of few weeks.  Was admitted on 09/01/18.  Seen by palliative care.  Decision made for home hospice care and she was DNR.  Family was looking into admission at [facility].  Plan then changed to have her taken out of [facility], and then try to arrange move to [place] to be closer to her daughters.  Spoke with pt's son over the phone and her daughters at bedside.  Confirmed DNR/DNI status and plan for comfort measures.  She passed away on October 27, 2018 at 2:35 AM.  Final diagnoses: Septic shock Metastatic breast cancer. ER positive, PR negative, HER2  negative Severe anemia and thrombocytopenia from bone marrow infiltration with cancer cells Lactic acidosis with anion gap metabolic acidosis AKI from ATN Hyperkalemia Hyponatremia Elevated liver enzymes from shock Jehovah's witness SARS Coronavirus 2 negative History of PE and Rt leg DVT Paroxysmal atrial fibrillation Malignancy related hypercalcemia  [name], MD [facility] Pulmonary/Critical Care Oct 27, 2018, 7:29 AM

## 2018-10-25 NOTE — Progress Notes (Signed)
Approx 0130: Pt granddaughter wanted to come up, but did not have green band. Per [facility], visitors not allowed without green band. Security worked with her and got approval to visit pt.  0215: pt labored breathing increased. Secretions suctioned from mouth. Pt passed @ 0235. 3 RN's and 4 family members present.   [place] donors notified, attending [name] MD answering service notified (stated he will be get the message first thing in the AM), [facility] notified, E-link contacted and stated Benjamine Mola Deterding is in the same group and will be notified tonight.   [place] donors referral code: 10/14/2018-012 by Rocky Crafts. Will contact family regarding eye donation. Eye prep will be done.

## 2018-10-25 DEATH — deceased

## 2019-01-14 ENCOUNTER — Ambulatory Visit: Payer: Medicare Other | Admitting: Hematology
